# Patient Record
Sex: Male | Born: 1950
Health system: Southern US, Community
[De-identification: ages and names within clinical notes are randomized; demographics above are authoritative.]

## PROBLEM LIST (undated history)

## (undated) DIAGNOSIS — T7840XA Allergy, unspecified, initial encounter: Secondary | ICD-10-CM

## (undated) DIAGNOSIS — M419 Scoliosis, unspecified: Secondary | ICD-10-CM

## (undated) DIAGNOSIS — N529 Male erectile dysfunction, unspecified: Secondary | ICD-10-CM

## (undated) DIAGNOSIS — M1990 Unspecified osteoarthritis, unspecified site: Secondary | ICD-10-CM

## (undated) DIAGNOSIS — G8929 Other chronic pain: Secondary | ICD-10-CM

## (undated) DIAGNOSIS — E119 Type 2 diabetes mellitus without complications: Secondary | ICD-10-CM

## (undated) DIAGNOSIS — E669 Obesity, unspecified: Secondary | ICD-10-CM

## (undated) DIAGNOSIS — M549 Dorsalgia, unspecified: Secondary | ICD-10-CM

## (undated) DIAGNOSIS — E876 Hypokalemia: Secondary | ICD-10-CM

## (undated) DIAGNOSIS — I10 Essential (primary) hypertension: Secondary | ICD-10-CM

## (undated) DIAGNOSIS — K264 Chronic or unspecified duodenal ulcer with hemorrhage: Secondary | ICD-10-CM

## (undated) DIAGNOSIS — K222 Esophageal obstruction: Secondary | ICD-10-CM

## (undated) DIAGNOSIS — N390 Urinary tract infection, site not specified: Secondary | ICD-10-CM

## (undated) DIAGNOSIS — I509 Heart failure, unspecified: Secondary | ICD-10-CM

## (undated) DIAGNOSIS — R7302 Impaired glucose tolerance (oral): Secondary | ICD-10-CM

## (undated) DIAGNOSIS — J189 Pneumonia, unspecified organism: Secondary | ICD-10-CM

## (undated) DIAGNOSIS — Z22322 Carrier or suspected carrier of Methicillin resistant Staphylococcus aureus: Secondary | ICD-10-CM

## (undated) DIAGNOSIS — M48061 Spinal stenosis, lumbar region without neurogenic claudication: Secondary | ICD-10-CM

## (undated) DIAGNOSIS — K449 Diaphragmatic hernia without obstruction or gangrene: Secondary | ICD-10-CM

## (undated) HISTORY — DX: Impaired glucose tolerance (oral): R73.02

## (undated) HISTORY — DX: Type 2 diabetes mellitus without complications: E11.9

## (undated) HISTORY — DX: Male erectile dysfunction, unspecified: N52.9

## (undated) HISTORY — PX: NASAL POLYP SURGERY: SHX186

## (undated) HISTORY — PX: LUMBAR LAMINECTOMY: SHX95

## (undated) HISTORY — DX: Spinal stenosis, lumbar region without neurogenic claudication: M48.061

## (undated) HISTORY — DX: Pneumonia, unspecified organism: J18.9

## (undated) HISTORY — DX: Obesity, unspecified: E66.9

## (undated) HISTORY — DX: Allergy, unspecified, initial encounter: T78.40XA

## (undated) HISTORY — PX: TONSILLECTOMY: SUR1361

## (undated) HISTORY — DX: Scoliosis, unspecified: M41.9

## (undated) HISTORY — DX: Unspecified osteoarthritis, unspecified site: M19.90

---

## 1999-06-10 ENCOUNTER — Encounter: Payer: Self-pay | Admitting: Emergency Medicine

## 1999-06-10 ENCOUNTER — Emergency Department (HOSPITAL_COMMUNITY): Admission: EM | Admit: 1999-06-10 | Discharge: 1999-06-10 | Payer: Self-pay | Admitting: Emergency Medicine

## 1999-06-21 ENCOUNTER — Ambulatory Visit (HOSPITAL_COMMUNITY): Admission: RE | Admit: 1999-06-21 | Discharge: 1999-06-21 | Payer: Self-pay | Admitting: *Deleted

## 1999-06-21 ENCOUNTER — Encounter: Payer: Self-pay | Admitting: *Deleted

## 2006-09-09 ENCOUNTER — Ambulatory Visit: Payer: Self-pay | Admitting: Family Medicine

## 2006-09-09 LAB — CONVERTED CEMR LAB
ALT: 45 units/L — ABNORMAL HIGH (ref 0–40)
AST: 42 units/L — ABNORMAL HIGH (ref 0–37)
Albumin: 4 g/dL (ref 3.5–5.2)
Alkaline Phosphatase: 75 units/L (ref 39–117)
BUN: 11 mg/dL (ref 6–23)
Basophils Absolute: 0 10*3/uL (ref 0.0–0.1)
Basophils Relative: 0.3 % (ref 0.0–1.0)
CO2: 30 meq/L (ref 19–32)
Calcium: 9.5 mg/dL (ref 8.4–10.5)
Chloride: 108 meq/L (ref 96–112)
Chol/HDL Ratio, serum: 3.6
Cholesterol: 169 mg/dL (ref 0–200)
Creatinine, Ser: 1.1 mg/dL (ref 0.4–1.5)
Eosinophil percent: 5.2 % — ABNORMAL HIGH (ref 0.0–5.0)
GFR calc non Af Amer: 74 mL/min
Glomerular Filtration Rate, Af Am: 90 mL/min/{1.73_m2}
Glucose, Bld: 100 mg/dL — ABNORMAL HIGH (ref 70–99)
HCT: 44.4 % (ref 39.0–52.0)
HDL: 47.3 mg/dL (ref >39.0)
Hemoglobin: 14.7 g/dL (ref 13.0–17.0)
Hgb A1c MFr Bld: 6.3 % — ABNORMAL HIGH (ref 4.6–6.0)
LDL Cholesterol: 109 mg/dL — ABNORMAL HIGH (ref 0–99)
Lymphocytes Relative: 33.2 % (ref 12.0–46.0)
MCHC: 33.1 g/dL (ref 30.0–36.0)
MCV: 95.3 fL (ref 78.0–100.0)
Monocytes Absolute: 0.8 10*3/uL — ABNORMAL HIGH (ref 0.2–0.7)
Monocytes Relative: 15.2 % — ABNORMAL HIGH (ref 3.0–11.0)
Neutro Abs: 2.4 10*3/uL (ref 1.4–7.7)
Neutrophils Relative %: 46.1 % (ref 43.0–77.0)
PSA: 0.64 ng/mL (ref 0.10–4.00)
Platelets: 328 10*3/uL (ref 150–400)
Potassium: 4.8 meq/L (ref 3.5–5.1)
RBC: 4.66 M/uL (ref 4.22–5.81)
RDW: 13 % (ref 11.5–14.6)
Sodium: 142 meq/L (ref 135–145)
TSH: 1.03 microintl units/mL (ref 0.35–5.50)
Total Bilirubin: 0.5 mg/dL (ref 0.3–1.2)
Total Protein: 7.5 g/dL (ref 6.0–8.3)
Triglyceride fasting, serum: 66 mg/dL (ref 0–149)
VLDL: 13 mg/dL (ref 0–40)
WBC: 5.3 10*3/uL (ref 4.5–10.5)

## 2006-09-17 ENCOUNTER — Ambulatory Visit: Payer: Self-pay | Admitting: Family Medicine

## 2006-09-24 ENCOUNTER — Ambulatory Visit: Payer: Self-pay | Admitting: Family Medicine

## 2006-10-08 HISTORY — PX: COLONOSCOPY: SHX174

## 2006-10-10 ENCOUNTER — Ambulatory Visit: Payer: Self-pay | Admitting: Gastroenterology

## 2006-10-22 ENCOUNTER — Encounter (INDEPENDENT_AMBULATORY_CARE_PROVIDER_SITE_OTHER): Payer: Self-pay | Admitting: *Deleted

## 2006-10-22 ENCOUNTER — Ambulatory Visit: Payer: Self-pay | Admitting: Gastroenterology

## 2006-11-26 ENCOUNTER — Ambulatory Visit: Payer: Self-pay | Admitting: Family Medicine

## 2006-11-26 LAB — CONVERTED CEMR LAB
Cholesterol: 198 mg/dL (ref 0–200)
Creatinine,U: 144.8 mg/dL
Glucose, Bld: 114 mg/dL — ABNORMAL HIGH (ref 70–99)
HDL: 42.9 mg/dL (ref >39.0)
Hgb A1c MFr Bld: 6.6 % — ABNORMAL HIGH (ref 4.6–6.0)
LDL Cholesterol: 131 mg/dL — ABNORMAL HIGH (ref 0–99)
Microalb Creat Ratio: 11 mg/g (ref 0.0–30.0)
Microalb, Ur: 1.6 mg/dL (ref 0.0–1.9)
Total CHOL/HDL Ratio: 4.6
Triglycerides: 120 mg/dL (ref 0–149)
VLDL: 24 mg/dL (ref 0–40)

## 2007-10-31 ENCOUNTER — Ambulatory Visit: Payer: Self-pay | Admitting: Family Medicine

## 2007-10-31 LAB — CONVERTED CEMR LAB
ALT: 22 units/L (ref 0–53)
AST: 23 units/L (ref 0–37)
Albumin: 3.8 g/dL (ref 3.5–5.2)
Alkaline Phosphatase: 61 units/L (ref 39–117)
BUN: 12 mg/dL (ref 6–23)
Basophils Absolute: 0 10*3/uL (ref 0.0–0.1)
Basophils Relative: 0.5 % (ref 0.0–1.0)
Bilirubin Urine: NEGATIVE
Bilirubin, Direct: 0.1 mg/dL (ref 0.0–0.3)
Blood in Urine, dipstick: NEGATIVE
CO2: 31 meq/L (ref 19–32)
Calcium: 9.9 mg/dL (ref 8.4–10.5)
Chloride: 104 meq/L (ref 96–112)
Cholesterol: 181 mg/dL (ref 0–200)
Creatinine, Ser: 1.1 mg/dL (ref 0.4–1.5)
Eosinophils Absolute: 0.2 10*3/uL (ref 0.0–0.6)
Eosinophils Relative: 5 % (ref 0.0–5.0)
GFR calc Af Amer: 89 mL/min
GFR calc non Af Amer: 74 mL/min
Glucose, Bld: 121 mg/dL — ABNORMAL HIGH (ref 70–99)
Glucose, Urine, Semiquant: NEGATIVE
HCT: 37.7 % — ABNORMAL LOW (ref 39.0–52.0)
HDL: 34.8 mg/dL — ABNORMAL LOW (ref >39.0)
Hemoglobin: 13.3 g/dL (ref 13.0–17.0)
Ketones, urine, test strip: NEGATIVE
LDL Cholesterol: 128 mg/dL — ABNORMAL HIGH (ref 0–99)
Lymphocytes Relative: 37 % (ref 12.0–46.0)
MCHC: 35.3 g/dL (ref 30.0–36.0)
MCV: 92.5 fL (ref 78.0–100.0)
Monocytes Absolute: 0.7 10*3/uL (ref 0.2–0.7)
Monocytes Relative: 13.8 % — ABNORMAL HIGH (ref 3.0–11.0)
Neutro Abs: 2.1 10*3/uL (ref 1.4–7.7)
Neutrophils Relative %: 43.7 % (ref 43.0–77.0)
Nitrite: NEGATIVE
PSA: 0.54 ng/mL (ref 0.10–4.00)
Platelets: 270 10*3/uL (ref 150–400)
Potassium: 4.1 meq/L (ref 3.5–5.1)
Protein, U semiquant: NEGATIVE
RBC: 4.07 M/uL — ABNORMAL LOW (ref 4.22–5.81)
RDW: 12.4 % (ref 11.5–14.6)
Sodium: 141 meq/L (ref 135–145)
Specific Gravity, Urine: 1.025
TSH: 1.41 microintl units/mL (ref 0.35–5.50)
Total Bilirubin: 0.5 mg/dL (ref 0.3–1.2)
Total CHOL/HDL Ratio: 5.2
Total Protein: 7.2 g/dL (ref 6.0–8.3)
Triglycerides: 92 mg/dL (ref 0–149)
Urobilinogen, UA: 0.2
VLDL: 18 mg/dL (ref 0–40)
WBC Urine, dipstick: NEGATIVE
WBC: 4.8 10*3/uL (ref 4.5–10.5)
pH: 7

## 2007-11-07 ENCOUNTER — Ambulatory Visit: Payer: Self-pay | Admitting: Family Medicine

## 2007-11-07 DIAGNOSIS — E6609 Other obesity due to excess calories: Secondary | ICD-10-CM | POA: Insufficient documentation

## 2007-11-07 DIAGNOSIS — F528 Other sexual dysfunction not due to a substance or known physiological condition: Secondary | ICD-10-CM | POA: Insufficient documentation

## 2007-11-07 DIAGNOSIS — R739 Hyperglycemia, unspecified: Secondary | ICD-10-CM | POA: Insufficient documentation

## 2007-11-07 DIAGNOSIS — I10 Essential (primary) hypertension: Secondary | ICD-10-CM | POA: Insufficient documentation

## 2007-11-07 DIAGNOSIS — E669 Obesity, unspecified: Secondary | ICD-10-CM

## 2008-02-02 ENCOUNTER — Ambulatory Visit: Payer: Self-pay | Admitting: Family Medicine

## 2008-02-02 LAB — CONVERTED CEMR LAB: Hgb A1c MFr Bld: 6.4 % — ABNORMAL HIGH (ref 4.6–6.0)

## 2008-02-09 ENCOUNTER — Ambulatory Visit: Payer: Self-pay | Admitting: Family Medicine

## 2008-02-09 DIAGNOSIS — M25559 Pain in unspecified hip: Secondary | ICD-10-CM | POA: Insufficient documentation

## 2008-10-29 ENCOUNTER — Ambulatory Visit: Payer: Self-pay | Admitting: Family Medicine

## 2008-10-29 LAB — CONVERTED CEMR LAB
ALT: 25 units/L (ref 0–53)
AST: 30 units/L (ref 0–37)
Albumin: 4 g/dL (ref 3.5–5.2)
Alkaline Phosphatase: 60 units/L (ref 39–117)
BUN: 14 mg/dL (ref 6–23)
Basophils Absolute: 0 10*3/uL (ref 0.0–0.1)
Basophils Relative: 0.5 % (ref 0.0–3.0)
Bilirubin Urine: NEGATIVE
Bilirubin, Direct: 0.1 mg/dL (ref 0.0–0.3)
Blood in Urine, dipstick: NEGATIVE
CO2: 28 meq/L (ref 19–32)
Calcium: 9.6 mg/dL (ref 8.4–10.5)
Chloride: 105 meq/L (ref 96–112)
Cholesterol: 168 mg/dL (ref 0–200)
Creatinine, Ser: 1 mg/dL (ref 0.4–1.5)
Eosinophils Absolute: 0.2 10*3/uL (ref 0.0–0.7)
Eosinophils Relative: 3.1 % (ref 0.0–5.0)
GFR calc Af Amer: 99 mL/min
GFR calc non Af Amer: 82 mL/min
Glucose, Bld: 109 mg/dL — ABNORMAL HIGH (ref 70–99)
Glucose, Urine, Semiquant: NEGATIVE
HCT: 38.7 % — ABNORMAL LOW (ref 39.0–52.0)
HDL: 34.7 mg/dL — ABNORMAL LOW (ref >39.0)
Hemoglobin: 13 g/dL (ref 13.0–17.0)
Hgb A1c MFr Bld: 6.5 % — ABNORMAL HIGH (ref 4.6–6.0)
Ketones, urine, test strip: NEGATIVE
LDL Cholesterol: 119 mg/dL — ABNORMAL HIGH (ref 0–99)
Lymphocytes Relative: 26.8 % (ref 12.0–46.0)
MCHC: 33.6 g/dL (ref 30.0–36.0)
MCV: 96.3 fL (ref 78.0–100.0)
Monocytes Absolute: 0.6 10*3/uL (ref 0.1–1.0)
Monocytes Relative: 11.2 % (ref 3.0–12.0)
Neutro Abs: 2.9 10*3/uL (ref 1.4–7.7)
Neutrophils Relative %: 58.4 % (ref 43.0–77.0)
Nitrite: NEGATIVE
PSA: 0.51 ng/mL (ref 0.10–4.00)
Platelets: 289 10*3/uL (ref 150–400)
Potassium: 3.4 meq/L — ABNORMAL LOW (ref 3.5–5.1)
Protein, U semiquant: NEGATIVE
RBC: 4.02 M/uL — ABNORMAL LOW (ref 4.22–5.81)
RDW: 13.1 % (ref 11.5–14.6)
Sodium: 140 meq/L (ref 135–145)
Specific Gravity, Urine: 1.02
TSH: 1.04 microintl units/mL (ref 0.35–5.50)
Total Bilirubin: 0.7 mg/dL (ref 0.3–1.2)
Total CHOL/HDL Ratio: 4.8
Total Protein: 7.5 g/dL (ref 6.0–8.3)
Triglycerides: 72 mg/dL (ref 0–149)
Urobilinogen, UA: 0.2
VLDL: 14 mg/dL (ref 0–40)
WBC Urine, dipstick: NEGATIVE
WBC: 5.1 10*3/uL (ref 4.5–10.5)
pH: 6.5

## 2008-11-02 ENCOUNTER — Ambulatory Visit: Payer: Self-pay | Admitting: Family Medicine

## 2008-11-02 DIAGNOSIS — M1990 Unspecified osteoarthritis, unspecified site: Secondary | ICD-10-CM | POA: Insufficient documentation

## 2009-01-25 ENCOUNTER — Ambulatory Visit: Payer: Self-pay | Admitting: Family Medicine

## 2009-01-25 ENCOUNTER — Telehealth: Payer: Self-pay | Admitting: Family Medicine

## 2009-01-25 LAB — CONVERTED CEMR LAB
BUN: 10 mg/dL (ref 6–23)
CO2: 26 meq/L (ref 19–32)
Calcium: 9.4 mg/dL (ref 8.4–10.5)
Chloride: 108 meq/L (ref 96–112)
Creatinine, Ser: 0.9 mg/dL (ref 0.4–1.5)
GFR calc non Af Amer: 112.05 mL/min (ref >60)
Glucose, Bld: 118 mg/dL — ABNORMAL HIGH (ref 70–99)
Hgb A1c MFr Bld: 6.5 % (ref 4.6–6.5)
Potassium: 3.5 meq/L (ref 3.5–5.1)
Sodium: 140 meq/L (ref 135–145)

## 2009-02-01 ENCOUNTER — Ambulatory Visit: Payer: Self-pay | Admitting: Family Medicine

## 2009-06-23 ENCOUNTER — Telehealth: Payer: Self-pay | Admitting: Internal Medicine

## 2009-09-21 ENCOUNTER — Encounter (INDEPENDENT_AMBULATORY_CARE_PROVIDER_SITE_OTHER): Payer: Self-pay | Admitting: *Deleted

## 2009-10-24 ENCOUNTER — Telehealth: Payer: Self-pay | Admitting: Family Medicine

## 2009-10-24 ENCOUNTER — Ambulatory Visit: Payer: Self-pay | Admitting: Family Medicine

## 2009-10-24 LAB — CONVERTED CEMR LAB
ALT: 26 units/L (ref 0–53)
AST: 27 units/L (ref 0–37)
Albumin: 4 g/dL (ref 3.5–5.2)
Alkaline Phosphatase: 62 units/L (ref 39–117)
BUN: 8 mg/dL (ref 6–23)
Basophils Absolute: 0 10*3/uL (ref 0.0–0.1)
Basophils Relative: 0.6 % (ref 0.0–3.0)
Bilirubin Urine: NEGATIVE
Bilirubin, Direct: 0.1 mg/dL (ref 0.0–0.3)
Blood in Urine, dipstick: NEGATIVE
CO2: 27 meq/L (ref 19–32)
Calcium: 9.6 mg/dL (ref 8.4–10.5)
Chloride: 106 meq/L (ref 96–112)
Cholesterol: 175 mg/dL (ref 0–200)
Creatinine, Ser: 0.9 mg/dL (ref 0.4–1.5)
Eosinophils Absolute: 0.1 10*3/uL (ref 0.0–0.7)
Eosinophils Relative: 3 % (ref 0.0–5.0)
GFR calc non Af Amer: 111.75 mL/min (ref >60)
Glucose, Bld: 123 mg/dL — ABNORMAL HIGH (ref 70–99)
Glucose, Urine, Semiquant: NEGATIVE
HCT: 39.7 % (ref 39.0–52.0)
HDL: 39.3 mg/dL (ref >39.00)
Hemoglobin: 12.9 g/dL — ABNORMAL LOW (ref 13.0–17.0)
Ketones, urine, test strip: NEGATIVE
LDL Cholesterol: 122 mg/dL — ABNORMAL HIGH (ref 0–99)
Lymphocytes Relative: 24.3 % (ref 12.0–46.0)
Lymphs Abs: 1.2 10*3/uL (ref 0.7–4.0)
MCHC: 32.5 g/dL (ref 30.0–36.0)
MCV: 101.7 fL — ABNORMAL HIGH (ref 78.0–100.0)
Monocytes Absolute: 0.5 10*3/uL (ref 0.1–1.0)
Monocytes Relative: 9.3 % (ref 3.0–12.0)
Neutro Abs: 3.2 10*3/uL (ref 1.4–7.7)
Neutrophils Relative %: 62.8 % (ref 43.0–77.0)
Nitrite: NEGATIVE
PSA: 0.53 ng/mL (ref 0.10–4.00)
Platelets: 282 10*3/uL (ref 150.0–400.0)
Potassium: 3.9 meq/L (ref 3.5–5.1)
Protein, U semiquant: NEGATIVE
RBC: 3.9 M/uL — ABNORMAL LOW (ref 4.22–5.81)
RDW: 13.6 % (ref 11.5–14.6)
Sodium: 142 meq/L (ref 135–145)
Specific Gravity, Urine: 1.02
TSH: 0.45 microintl units/mL (ref 0.35–5.50)
Total Bilirubin: 0.7 mg/dL (ref 0.3–1.2)
Total CHOL/HDL Ratio: 4
Total Protein: 7.4 g/dL (ref 6.0–8.3)
Triglycerides: 68 mg/dL (ref 0.0–149.0)
Urobilinogen, UA: 0.2
VLDL: 13.6 mg/dL (ref 0.0–40.0)
WBC Urine, dipstick: NEGATIVE
WBC: 5 10*3/uL (ref 4.5–10.5)
pH: 6.5

## 2009-10-31 ENCOUNTER — Ambulatory Visit: Payer: Self-pay | Admitting: Family Medicine

## 2010-01-02 ENCOUNTER — Encounter: Payer: Self-pay | Admitting: Internal Medicine

## 2010-01-02 ENCOUNTER — Ambulatory Visit: Payer: Self-pay | Admitting: Family Medicine

## 2010-01-03 LAB — CONVERTED CEMR LAB
BUN: 7 mg/dL (ref 6–23)
CO2: 28 meq/L (ref 19–32)
Calcium: 9.3 mg/dL (ref 8.4–10.5)
Chloride: 104 meq/L (ref 96–112)
Creatinine, Ser: 0.8 mg/dL (ref 0.4–1.5)
GFR calc non Af Amer: 127.93 mL/min (ref >60)
Glucose, Bld: 122 mg/dL — ABNORMAL HIGH (ref 70–99)
Hgb A1c MFr Bld: 6.3 % (ref 4.6–6.5)
Potassium: 3.3 meq/L — ABNORMAL LOW (ref 3.5–5.1)
Sodium: 140 meq/L (ref 135–145)

## 2010-03-07 ENCOUNTER — Telehealth: Payer: Self-pay | Admitting: Family Medicine

## 2010-03-08 ENCOUNTER — Telehealth: Payer: Self-pay | Admitting: Family Medicine

## 2010-03-09 ENCOUNTER — Telehealth: Payer: Self-pay | Admitting: Family Medicine

## 2010-05-24 ENCOUNTER — Telehealth: Payer: Self-pay | Admitting: Gastroenterology

## 2010-07-27 ENCOUNTER — Telehealth: Payer: Self-pay | Admitting: Family Medicine

## 2010-07-28 ENCOUNTER — Telehealth: Payer: Self-pay | Admitting: Family Medicine

## 2010-10-26 ENCOUNTER — Ambulatory Visit
Admission: RE | Admit: 2010-10-26 | Discharge: 2010-10-26 | Payer: Self-pay | Source: Home / Self Care | Attending: Family Medicine | Admitting: Family Medicine

## 2010-10-26 ENCOUNTER — Other Ambulatory Visit: Payer: Self-pay | Admitting: Family Medicine

## 2010-10-26 LAB — CBC WITH DIFFERENTIAL/PLATELET
Basophils Absolute: 0 10*3/uL (ref 0.0–0.1)
Basophils Relative: 0.3 % (ref 0.0–3.0)
Eosinophils Absolute: 0.2 10*3/uL (ref 0.0–0.7)
Eosinophils Relative: 4.3 % (ref 0.0–5.0)
HCT: 41 % (ref 39.0–52.0)
Hemoglobin: 13.5 g/dL (ref 13.0–17.0)
Lymphocytes Relative: 29.3 % (ref 12.0–46.0)
Lymphs Abs: 1.4 10*3/uL (ref 0.7–4.0)
MCHC: 33 g/dL (ref 30.0–36.0)
MCV: 97.9 fl (ref 78.0–100.0)
Monocytes Absolute: 0.5 10*3/uL (ref 0.1–1.0)
Monocytes Relative: 10.1 % (ref 3.0–12.0)
Neutro Abs: 2.7 10*3/uL (ref 1.4–7.7)
Neutrophils Relative %: 56 % (ref 43.0–77.0)
Platelets: 313 10*3/uL (ref 150.0–400.0)
RBC: 4.18 Mil/uL — ABNORMAL LOW (ref 4.22–5.81)
RDW: 13.5 % (ref 11.5–14.6)
WBC: 4.9 10*3/uL (ref 4.5–10.5)

## 2010-10-26 LAB — BASIC METABOLIC PANEL
BUN: 14 mg/dL (ref 6–23)
CO2: 27 mEq/L (ref 19–32)
Calcium: 9.3 mg/dL (ref 8.4–10.5)
Chloride: 102 mEq/L (ref 96–112)
Creatinine, Ser: 1.1 mg/dL (ref 0.4–1.5)
GFR: 89.27 mL/min (ref >60.00)
Glucose, Bld: 111 mg/dL — ABNORMAL HIGH (ref 70–99)
Potassium: 3.6 mEq/L (ref 3.5–5.1)
Sodium: 137 mEq/L (ref 135–145)

## 2010-10-26 LAB — CONVERTED CEMR LAB
Bilirubin Urine: NEGATIVE
Blood in Urine, dipstick: NEGATIVE
Glucose, Urine, Semiquant: NEGATIVE
Ketones, urine, test strip: NEGATIVE
Nitrite: NEGATIVE
Protein, U semiquant: NEGATIVE
Specific Gravity, Urine: 1.02
Urobilinogen, UA: 0.2
WBC Urine, dipstick: NEGATIVE
pH: 7

## 2010-10-26 LAB — HEPATIC FUNCTION PANEL
ALT: 28 U/L (ref 0–53)
AST: 31 U/L (ref 0–37)
Albumin: 3.8 g/dL (ref 3.5–5.2)
Alkaline Phosphatase: 72 U/L (ref 39–117)
Bilirubin, Direct: 0 mg/dL (ref 0.0–0.3)
Total Bilirubin: 0.4 mg/dL (ref 0.3–1.2)
Total Protein: 7.5 g/dL (ref 6.0–8.3)

## 2010-10-26 LAB — LIPID PANEL
Cholesterol: 160 mg/dL (ref 0–200)
HDL: 37.5 mg/dL — ABNORMAL LOW (ref >39.00)
LDL Cholesterol: 109 mg/dL — ABNORMAL HIGH (ref 0–99)
Total CHOL/HDL Ratio: 4
Triglycerides: 66 mg/dL (ref 0.0–149.0)
VLDL: 13.2 mg/dL (ref 0.0–40.0)

## 2010-10-26 LAB — PSA: PSA: 0.56 ng/mL (ref 0.10–4.00)

## 2010-10-26 LAB — TSH: TSH: 1.2 u[IU]/mL (ref 0.35–5.50)

## 2010-11-02 ENCOUNTER — Encounter: Payer: Self-pay | Admitting: Family Medicine

## 2010-11-02 ENCOUNTER — Ambulatory Visit
Admission: RE | Admit: 2010-11-02 | Discharge: 2010-11-02 | Payer: Self-pay | Source: Home / Self Care | Attending: Family Medicine | Admitting: Family Medicine

## 2010-11-07 NOTE — Progress Notes (Signed)
Summary: Schedule Colonoscopy.  Phone Note Outgoing Call Call back at [facility] Phone [phone]   Call placed by: Harlow Mares CMA Duncan Dull),  May 24, 2010 9:23 AM Call placed to: Patient Summary of Call: patient will cal back to schedule his colonoscopy he is in the process of moving. Initial call taken by: Harlow Mares CMA Duncan Dull),  May 24, 2010 9:32 AM

## 2010-11-07 NOTE — Assessment & Plan Note (Signed)
Summary: 3 MONTH ROV/NJR   Vital Signs:  Patient profile:   60 year old male Height:      71.25 inches Weight:      278 pounds BMI:     38.64 BP sitting:   124 / 78  (left arm) Cuff size:   large  Vitals Entered By: Kern Reap CMA (February 01, 2009 9:04 AM)  History of Present Illness: Keith [name] is a 60 year old male, who comes in today for reevaluation of 3 problems.  He has underlying hypertension, for which he takes Tenoretic 50 to 25 q.a.m., clonidine, .1 q.a.m..  BP now is down to 124/78.  No side effects from medication.  He did have an elevated blood sugar when we did as a physical exam in January.  He's been walking now and changed his diet.  Blood sugars dropped back to 118 with a hemoglobin A1c of 6.5.  His underlying osteoarthritis, and we tried Motrin, 600 mg 3 times a day, however, that did not help.  We switch to Darvocet-N 100 t.i.d., p.r.n., that's helped tremendously.  He is able to walk.  No side effects  Allergies (verified): No Known Drug Allergies  Past History:  Past medical, surgical, family and social histories (including risk factors) reviewed, and no changes noted (except as noted below).  Past Medical History:    Reviewed history from 11/02/2008 and no changes required:    Hypertension    obesity    erectile dysfunction    glucose intolerance    Osteoarthritis  Family History:    Reviewed history from 10/29/2008 and no changes required:       father is 15, had a CVA when he was 50 from hypertension and smoking       mother 54, diabetes, and asthma       one brother in good health              mother is diabetic, had a leg amputation.  He had to put her in a nursing home.  This year.  Father died suddenly at age 60 at home unknown cause.  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 Take one tab three times a day  Problem # 2:  OTHER ABNORMAL GLUCOSE (ICD-790.29) Assessment: Improved  Problem # 3:  HYPERTENSION (ICD-401.9) Assessment: Improved  His updated medication list for this problem includes:    Clonidine Hcl 0.1 Mg Tabs (Clonidine hcl) .Marland Kitchen... Take 1 tablet by mouth every morning ;cpx when due    Tenoretic 50 50-25 Mg Tabs (Atenolol-chlorthalidone) .Marland Kitchen... Take 1 tablet by mouth once in the am;cpx when due  Complete Medication List: 1)  Clonidine Hcl 0.1 Mg Tabs (Clonidine hcl) .... Take 1 tablet by mouth every morning ;cpx  when due 2)  Tenoretic 50 50-25 Mg Tabs (Atenolol-chlorthalidone) .... Take 1 tablet by mouth once in the am;cpx when due 3)  Adprin B 325 Mg Tabs (Aspirin buf(cacarb-mgcarb-mgo)) 4)  Viagra 50 Mg Tabs (Sildenafil citrate) .... Uad 5)  Darvocet-n 100 100-650 Mg Tabs (Propoxyphene n-apap) .... Take one tab three times a day  Patient Instructions: 1)  continued the current treatment program.  Return in January 2011 for your annual physical exam. Prescriptions: DARVOCET-N 100 100-650 MG TABS (PROPOXYPHENE N-APAP) take one tab three times a day  #100 x 3   Entered and Authorized by:   Roderick Pee MD   Signed by:   Roderick Pee MD on 02/01/2009   Method used:   Print then Give to Patient   RxID:   [id number]

## 2010-11-07 NOTE — Progress Notes (Signed)
Summary: [facility] hasnt been called in  Phone Note Call from Patient Call back at [phone] cell   Caller: Patient Summary of Call: [facility] [address] and was told that pharmacy has not rcvd refill for the Vicodin yet. Pls call in asap today.  Initial call taken by: Lucy Antigua,  July 28, 2010 11:08 AM  Follow-up for Phone Call        Phone Call Completed Follow-up by: Kern Reap CMA Duncan Dull),  July 28, 2010 12:13 PM

## 2010-11-07 NOTE — Progress Notes (Signed)
Summary: Pt called to check on status of refill for Vicodin  Phone Note Call from Patient Call back at Home Phone [phone]   Caller: Patient Summary of Call: Pt called to check on status of [facility]ill req [facility] [address] Pt called pharmacy and was told nothing has been called in yet.  Initial call taken by: Lucy Antigua,  March 09, 2010 11:19 AM  Follow-up for Phone Call        spoke with pharmacy and patient  Follow-up by: Kern Reap CMA Duncan Dull),  March 09, 2010 11:49 AM

## 2010-11-07 NOTE — Assessment & Plan Note (Signed)
Summary: cpx/ patient will come fasting/mhf   Vital Signs:  Patient Profile:   60 Years Old Male Height:     71.25 inches (182.25 cm) Weight:      284 pounds Temp:     98.2 degrees F oral BP sitting:   148 / 98  (left arm) Cuff size:   large  Vitals Entered By: Kern Reap CMA (October 29, 2008 9:40 AM)                 Chief Complaint:  cpx.  History of Present Illness: Keith [name]  is a 60 year old male nonsmoker who comes in today for physical exam for evaluation of underlying hypertension erectile dysfunction and a new problem pain in the left side of his back.  His hypertension is treated with clonidine, .1, daily, and Tenoretic 50 -- 25 daily.  BP at home 130/85.  Erectile dysfunction.  She was graft a half a tablet p.r.n.  Doing well.  No problems.  A new problem is back pain.  He, states it's been there for a couple months.  If the left lumbar area.  It's worse when he sits for long periods of time.  The discomfort tends to radiate into his leg and is experiencing occasional numbness in his toes.  He is never any trauma to the spine.  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  Last tetanus 2005.  He declines both flu shots    Prior Medication List:  CLONIDINE HCL 0.1 MG  TABS (CLONIDINE HCL) Take 1 tablet by mouth every morning ;cpx when due TENORETIC 50 50-25 MG  TABS (ATENOLOL-CHLORTHALIDONE) Take 1 tablet by mouth once in the am;cpx when due ADPRIN B 325 MG  TABS (ASPIRIN BUF(CACARB-MGCARB-MGO))  VIAGRA 50 MG  TABS (SILDENAFIL CITRATE) UAD   Updated Prior Medication List: CLONIDINE HCL 0.1 MG  TABS (CLONIDINE HCL) Take 1 tablet by mouth every morning ;cpx when due TENORETIC 50 50-25 MG  TABS (ATENOLOL-CHLORTHALIDONE) Take 1 tablet by mouth once in the am;cpx when due ADPRIN B 325 MG  TABS (ASPIRIN BUF(CACARB-MGCARB-MGO))  VIAGRA 50 MG  TABS (SILDENAFIL CITRATE) UAD  Current Allergies: No known allergies   Past Medical  History:    Reviewed history from 02/09/2008 and no changes required:       Hypertension       obesity       erectile dysfunction       glucose intolerance   Family History:    Reviewed history from 11/07/2007 and no changes required:       father is 35, had a CVA when he was 51 from hypertension and smoking       mother 9, diabetes, and asthma       one brother in good health              mother is diabetic, had a leg amputation.  He had to put her in a nursing home.  This year.  Father died suddenly at age 68 at home unknown cause.  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 No apparent alopecia or balding. Eyes:     No corneal or conjunctival inflammation noted. EOMI. Perrla. Funduscopic exam benign, without hemorrhages, exudates or papilledema. Vision grossly normal. Ears:     External ear exam shows no significant lesions or deformities.  Otoscopic examination reveals clear canals, tympanic membranes are intact bilaterally without bulging, retraction, inflammation or discharge. Hearing is grossly normal bilaterally. Nose:     External nasal examination shows no deformity or inflammation. Nasal mucosa are pink and moist without lesions or exudates. Mouth:     Oral mucosa and oropharynx without lesions or exudates.  Teeth in good repair. Neck:     No deformities, masses, or tenderness noted. Chest Wall:     No deformities, masses, tenderness or gynecomastia noted. Breasts:     No masses or gynecomastia noted Lungs:     Normal  respiratory effort, chest expands symmetrically. Lungs are clear to auscultation, no crackles or wheezes. Heart:     Normal rate and regular rhythm. S1 and S2 normal without gallop, murmur, click, rub or other extra sounds. Abdomen:     Bowel sounds positive,abdomen soft and non-tender without masses, organomegaly or hernias noted. Rectal:     No external abnormalities noted. Normal sphincter tone. No rectal masses or tenderness. Genitalia:     Testes bilaterally descended without nodularity, tenderness or masses. No scrotal masses or lesions. No penis lesions or urethral discharge. Prostate:     Prostate gland firm and smooth, no enlargement, nodularity, tenderness, mass, asymmetry or induration. Msk:     No deformity or scoliosis noted of thoracic or lumbar spine.   Pulses:     R and L carotid,radial,femoral,dorsalis pedis and posterior tibial pulses are full and equal bilaterally Extremities:     No clubbing, cyanosis, edema, or deformity noted with normal full range of motion of all joints.   Neurologic:     No cranial nerve deficits noted. Station and gait are normal. Plantar reflexes are down-going bilaterally. DTRs are symmetrical throughout. Sensory, motor and coordinative functions appear intact. Skin:     Intact without suspicious lesions or rashes Cervical Nodes:     No lymphadenopathy noted Axillary Nodes:     No palpable lymphadenopathy Inguinal Nodes:     No significant adenopathy Psych:     Cognition and judgment appear intact. Alert and cooperative with normal attention span and concentration. No apparent delusions, illusions, hallucinations    Impression & Recommendations:  Problem # 1:  HIP PAIN, LEFT (ICD-719.45) Assessment: New  His updated medication list for this problem includes:    Adprin B 325 Mg Tabs (Aspirin buf(cacarb-mgcarb-mgo))  Orders: TLB-Lipid Panel (80061-LIPID) TLB-BMP (Basic Metabolic Panel-BMET) (80048-METABOL) TLB-CBC Platelet -  w/Differential (85025-CBCD) TLB-Hepatic/Liver Function Pnl (80076-HEPATIC) TLB-TSH (Thyroid Stimulating Hormone) (84443-TSH) TLB-A1C / Hgb A1C (Glycohemoglobin) (83036-A1C) TLB-PSA (Prostate Specific Antigen) (84153-PSA) T-Lumbar Spine Complete, 5 Views (71110TC) T-Pelvis 1or 2 views (72170TC)   Problem # 2:  OTHER ABNORMAL GLUCOSE (ICD-790.29) Assessment: Unchanged  Orders: TLB-Lipid Panel (80061-LIPID) TLB-BMP (Basic Metabolic Panel-BMET) (80048-METABOL) TLB-CBC Platelet - w/Differential (85025-CBCD) TLB-Hepatic/Liver Function Pnl (80076-HEPATIC) TLB-TSH (Thyroid Stimulating Hormone) (84443-TSH) TLB-A1C / Hgb A1C (Glycohemoglobin) (83036-A1C) TLB-PSA (Prostate Specific Antigen) (84153-PSA)   Problem # 3:  OBESITY (ICD-278.00) Assessment: Unchanged  Orders: TLB-Lipid Panel (80061-LIPID) TLB-BMP (Basic Metabolic Panel-BMET) (80048-METABOL) TLB-CBC Platelet - w/Differential (85025-CBCD) TLB-Hepatic/Liver  Function Pnl (80076-HEPATIC) TLB-TSH (Thyroid Stimulating Hormone) (84443-TSH) TLB-A1C / Hgb A1C (Glycohemoglobin) (83036-A1C) TLB-PSA (Prostate Specific Antigen) (84153-PSA)   Problem # 4:  HYPERTENSION (ICD-401.9) Assessment: Improved  His updated medication list for this problem includes:    Clonidine Hcl 0.1 Mg Tabs (Clonidine hcl) .Marland Kitchen... Take 1 tablet by mouth every morning ;cpx when due    Tenoretic 50 50-25 Mg Tabs (Atenolol-chlorthalidone) .Marland Kitchen... Take 1 tablet by mouth once in the am;cpx when due  Orders: TLB-Lipid Panel (80061-LIPID) TLB-BMP (Basic Metabolic Panel-BMET) (80048-METABOL) TLB-CBC Platelet - w/Differential (85025-CBCD) TLB-Hepatic/Liver Function Pnl (80076-HEPATIC) TLB-TSH (Thyroid Stimulating Hormone) (84443-TSH) TLB-A1C / Hgb A1C (Glycohemoglobin) (83036-A1C) TLB-PSA (Prostate Specific Antigen) (84153-PSA) UA Dipstick w/o Micro (automated)  (81003) EKG w/ Interpretation (93000)   Problem # 5:  ERECTILE DYSFUNCTION, MILD (ICD-302.72)  Assessment: Improved  His updated medication list for this problem includes:    Viagra 50 Mg Tabs (Sildenafil citrate) ..... Uad  Orders: TLB-Lipid Panel (80061-LIPID) TLB-BMP (Basic Metabolic Panel-BMET) (80048-METABOL) TLB-CBC Platelet - w/Differential (85025-CBCD) TLB-Hepatic/Liver Function Pnl (80076-HEPATIC) TLB-TSH (Thyroid Stimulating Hormone) (84443-TSH) TLB-A1C / Hgb A1C (Glycohemoglobin) (83036-A1C) TLB-PSA (Prostate Specific Antigen) (84153-PSA)   Complete Medication List: 1)  Clonidine Hcl 0.1 Mg Tabs (Clonidine hcl) .... Take 1 tablet by mouth every morning ;cpx when due 2)  Tenoretic 50 50-25 Mg Tabs (Atenolol-chlorthalidone) .... Take 1 tablet by mouth once in the am;cpx when due 3)  Adprin B 325 Mg Tabs (Aspirin buf(cacarb-mgcarb-mgo)) 4)  Viagra 50 Mg Tabs (Sildenafil citrate) .... Uad  Other Orders: Venipuncture (47829)   Patient Instructions: 1)  take Motrin, 600 mg twice a day with food 3.  Her low back pain.  Go to the main office get x-rays of the lumbar spine.  Return two to 3 days after x-rays to review the report. 2)  It is important that you exercise regularly at least 20 minutes 5 times a week. If you develop chest pain, have severe difficulty breathing, or feel very tired , stop exercising immediately and seek medical attention. 3)  You need to lose weight. Consider a lower calorie diet and regular exercise.  4)  Schedule a colonoscopy/sigmoidoscopy to help detect colon cancer. 5)  Take an Aspirin every day. 6)  Check your Blood Pressure regularly. If it is above: you should make an appointment.   Prescriptions: VIAGRA 50 MG  TABS (SILDENAFIL CITRATE) UAD  #6 x 11   Entered and Authorized by:   Roderick Pee MD   Signed by:   Roderick Pee MD on 10/29/2008   Method used:   [facility]  [address] [id number]       [address]       [place], [place]  [address]       Ph: [phone] or [phone]        Fax: [phone]   RxID:   [id number] TENORETIC 50 50-25 MG  TABS (ATENOLOL-CHLORTHALIDONE) Take 1 tablet by mouth once in the am;cpx when due  #100 x 4   Entered and Authorized by:   Roderick Pee MD   Signed by:   Roderick Pee MD on 10/29/2008   Method used:   [facility]        [facility]  [address] [id number]       [address]       [facility] [place], [place]  [address]       Ph: [phone] or [phone]       Fax: [phone]   RxID:   [id number] CLONIDINE HCL 0.1 MG  TABS (CLONIDINE HCL) Take 1 tablet by mouth every morning ;cpx when due  #100 x 4   Entered and Authorized by:   Roderick Pee MD   Signed by:   Roderick Pee MD on 10/29/2008   Method used:   [facility]        [facility]  [address] [id number]       [address]       [place], [place]  [address]       Ph: [phone] or [phone]       Fax: [phone]   RxID:   [id number]     Laboratory Results   Urine Tests   Date/Time Reported: October 29, 2008 12:50 PM   Routine Urinalysis   Color: yellow Appearance: Clear Glucose: negative   (Normal Range: Negative) Bilirubin: negative   (Normal Range: Negative) Ketone: negative   (Normal Range: Negative) Spec. Gravity: 1.020   (Normal Range: 1.003-1.035) Blood: negative   (Normal Range: Negative) pH: 6.5   (Normal Range: 5.0-8.0) Protein: negative   (Normal Range: Negative) Urobilinogen: 0.2   (Normal Range: 0-1) Nitrite: negative   (Normal Range: Negative) Leukocyte Esterace: negative   (Normal Range: Negative)    Comments: Wynona Canes, CMA  October 29, 2008 12:50 PM

## 2010-11-07 NOTE — Progress Notes (Signed)
Summary: REFILL REQUEST  Phone Note Refill Request Message from:  Fax from Pharmacy on March 08, 2010 2:26 PM  Refills Requested: Medication #1:  VICODIN ES 7.5-750 MG TABS take half tab two times a day as needed pain.   Notes: [facility] [address]  called in 03/07/10  Initial call taken by: Debbra Riding,  March 08, 2010 2:27 PM

## 2010-11-07 NOTE — Progress Notes (Signed)
Summary: vicodin refill  Phone Note From Pharmacy   [facility]  [address] [id number] Reason for Call: Needs renewal Summary of Call: patient would like a refill of vicodin 7.5/750.  last cpx 1/11 last refill 05/26/10. is this okay to fill? Initial call taken by: Kern Reap CMA Duncan Dull),  July 27, 2010 12:38 PM  Follow-up for Phone Call         dispense 30 tabletsVicodin ES........ directions one half tab b.i.d., p.r.n. severe pain, refills x 5 Follow-up by: Roderick Pee MD,  July 27, 2010 12:43 PM  Additional Follow-up for Phone Call Additional follow up Details #1::        Pharmacist called Additional Follow-up by: Kern Reap CMA Duncan Dull),  July 28, 2010 8:52 AM

## 2010-11-07 NOTE — Progress Notes (Signed)
Summary: propoxyphene refill  Phone Note Call from Patient   Summary of Call: patient rx for propoxyphene is no longer available.  would you like to change this to a new rx?  [facility] [address]. Initial call taken by: Kern Reap CMA Duncan Dull),  October 24, 2009 9:11 AM  Follow-up for Phone Call        Vicodin ES..... dispense 60 tablets directions one half tablet b.i.d. for pain, refills x 4 Follow-up by: Roderick Pee MD,  October 24, 2009 1:46 PM  Additional Follow-up for Phone Call Additional follow up Details #1::        Rx Called In left message on machine for patient  Additional Follow-up by: Kern Reap CMA Duncan Dull),  October 24, 2009 5:30 PM    New/Updated Medications: VICODIN ES 7.5-750 MG TABS (HYDROCODONE-ACETAMINOPHEN) take half tab two times a day as needed pain

## 2010-11-07 NOTE — Assessment & Plan Note (Signed)
Summary: 4 day rov/njr   Vital Signs:  Patient Profile:   60 Years Old Male Height:     71.25 inches (182.25 cm) Weight:      279 pounds Temp:     98.2 degrees F oral BP sitting:   124 / 78  (left arm) Cuff size:   large  Vitals Entered By: Kern Reap CMA (November 02, 2008 9:20 AM)                 Chief Complaint:  follow up scans and labs.  History of Present Illness: Keith [name] is a 60 year old male, who comes in today for evaluation of hypertension, low back pain, and glucose intolerance.  His hypertension is treated with clonidine, .1, daily, and Tenoretic 50 -- 25 daily.  BP 124/78.  No side effects from medication.  His back and hip pain had been present now for about a year.  He said one day well at work he twisted 8 or 9 months ago didn't think anything about it, but no symptoms comfort.  His left hip with some tingling in his left great toe.  Since that time.  The discomfort comes and goes.  Muscle strength is normal.  No history of trauma.  We did x-rays of his back, which showed some degenerative changes in his lumbar spine.  Hips are normal.  No evidence of tumor.  He also has glucose intolerance with a fasting blood sugar of 109 however, his hemoglobin A1c is 6.5.  We discussed diet, exercise and weight loss, and potential medication.  Patient would like to try diet and exercise first.    Prior Medication List:  CLONIDINE HCL 0.1 MG  TABS (CLONIDINE HCL) Take 1 tablet by mouth every morning ;cpx when due TENORETIC 50 50-25 MG  TABS (ATENOLOL-CHLORTHALIDONE) Take 1 tablet by mouth once in the am;cpx when due ADPRIN B 325 MG  TABS (ASPIRIN BUF(CACARB-MGCARB-MGO))  VIAGRA 50 MG  TABS (SILDENAFIL CITRATE) UAD   Current Allergies (reviewed today): No known allergies   Past Medical History:    Reviewed history from 02/09/2008 and no changes required:       Hypertension       obesity       erectile dysfunction       glucose intolerance       Osteoarthritis    Social History:    Reviewed history from 11/07/2007 and no changes required:       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[employer], [employer]       Married       Former Smoker 2005       Alcohol use-no       Drug use-no       Regular exercise-no    Review of Systems      See HPI   Physical Exam  General:     Well-developed,well-nourished,in no acute distress; alert,appropriate and cooperative throughout examination    Impression & Recommendations:  Problem # 1:  OSTEOARTHRITIS (ICD-715.90) Assessment: New  His updated medication list for this problem includes:    Adprin B 325 Mg Tabs (Aspirin buf(cacarb-mgcarb-mgo))   Problem # 2:  HIP PAIN, LEFT (ICD-719.45) Assessment: New  His updated medication list for this problem includes:    Adprin B 325 Mg Tabs (Aspirin buf(cacarb-mgcarb-mgo))   Problem # 3:  OTHER ABNORMAL GLUCOSE (ICD-790.29) Assessment: Deteriorated  Problem # 4:  HYPERTENSION (ICD-401.9) Assessment: Improved  His updated medication list for this problem includes:    Clonidine Hcl 0.1 Mg  Tabs (Clonidine hcl) .Marland Kitchen... Take 1 tablet by mouth every morning ;cpx when due    Tenoretic 50 50-25 Mg Tabs (Atenolol-chlorthalidone) .Marland Kitchen... Take 1 tablet by mouth once in the am;cpx when due   Complete Medication List: 1)  Clonidine Hcl 0.1 Mg Tabs (Clonidine hcl) .... Take 1 tablet by mouth every morning ;cpx when due 2)  Tenoretic 50 50-25 Mg Tabs (Atenolol-chlorthalidone) .... Take 1 tablet by mouth once in the am;cpx when due 3)  Adprin B 325 Mg Tabs (Aspirin buf(cacarb-mgcarb-mgo)) 4)  Viagra 50 Mg Tabs (Sildenafil citrate) .... Uad   Patient Instructions: 1)  It is important that you exercise regularly at least 20 minutes 5 times a week. If you develop chest pain, have severe difficulty breathing, or feel very tired , stop exercising immediately and seek medical attention. 2)  You need to lose weight. Consider a lower calorie diet and regular exercise.  3)  Take an Aspirin  every day. 4)  Check your blood sugars regularly. If your readings are usually above : or below 70 you should contact our office. 5)  It is important that your Diabetic A1c level is checked every 3 months. 6)  See your eye doctor yearly to check for diabetic eye damage. 7)  Check your feet each night for sore areas, calluses or signs of infection. 8)  Check your Blood Pressure regularly. If it is above: you should make an appointment. 9)  BMP prior to visit, ICD-9:.....................250.00 10)  HbgA1C prior to visit, ICD-9:

## 2010-11-07 NOTE — Progress Notes (Signed)
Summary: rx for back pain  Phone Note Call from Patient   Caller: Patient Summary of Call: patient states that the motrin that he is using for his back pain is not helping.  he would like to try something a little stronger because he is having a hard time exercising.  his cell number is [phone] [facility] [address]  Initial call taken by: Kern Reap CMA,  January 25, 2009 8:51 AM  Follow-up for Phone Call        trial of Darvocet-N 100, dispense 100 tablets one to two tabs t.i.d., p.r.n. refills x 1 if this medication does not help or the back pain persists.  Office Eval. Follow-up by: Roderick Pee MD,  January 25, 2009 9:22 AM  Additional Follow-up for Phone Call Additional follow up Details #1::        Rx Called In Additional Follow-up by: Kern Reap CMA,  January 25, 2009 10:48 AM

## 2010-11-07 NOTE — Assessment & Plan Note (Signed)
Summary: f/u labs/yy   Vital Signs:  Patient Profile:   60 Years Old Male Height:     71.75 inches (182.25 cm) Weight:      274 pounds Temp:     98.5 degrees F oral BP sitting:   126 / 84  (left arm)  Vitals Entered By: Doristine Devoid (Feb 09, 2008 9:15 AM)                 Chief Complaint:  follow upon labs.  History of Present Illness: Keith [name] is a 60 year old male, who comes back today for follow-up of diabetes.  He was noted on his physical exam in January to have elevation of his blood sugar.  He was in the 130 range.  At that time he weighed 276 pounds.  He was advised to get on a diet and exercise program and come back for a follow-up hemoglobin A1c.  His hemoglobin A1c is now down to 6.4.  One month after he started his exercise program.  He began having pain in his left hip.  He denies any history of trauma.  He said when he sits it hurts worse when he gets up and walks.  It feels better.  He did not take any medication.  He said no history of trauma.  He also complains of pain in that left foot.  He says he thinks the arches collapsed.    Past Medical History:    Reviewed history from 11/07/2007 and no changes required:       Hypertension       obesity       erectile dysfunction       glucose intolerance     Review of Systems      See HPI   Physical Exam  General:     Well-developed,well-nourished,in no acute distress; alert,appropriate and cooperative throughout examination Msk:     right leg, normal left leg, normal.  Skin normal.  Pulses, a very flat collapsed left arch.  He also has decreased range of motion to external rotation of the left hip.  External rotation duplicates the discomfort.  He is having.    Impression & Recommendations:  Problem # 1:  OTHER ABNORMAL GLUCOSE (ICD-790.29) Assessment: Improved  Problem # 2:  OBESITY (ICD-278.00) Assessment: Improved  Problem # 3:  HIP PAIN, LEFT (ICD-719.45) Assessment: New  His updated  medication list for this problem includes:    Adprin B 325 Mg Tabs (Aspirin buf(cacarb-mgcarb-mgo))   Complete Medication List: 1)  Clonidine Hcl 0.1 Mg Tabs (Clonidine hcl) .... Take 1 tablet by mouth every morning ;cpx when due 2)  Tenoretic 50 50-25 Mg Tabs (Atenolol-chlorthalidone) .... Take 1 tablet by mouth once in the am;cpx when due 3)  Adprin B 325 Mg Tabs (Aspirin buf(cacarb-mgcarb-mgo)) 4)  Viagra 50 Mg Tabs (Sildenafil citrate) .... Uad   Patient Instructions: 1)  begin Motrin 600 mg twice a day with food.  If after one month.  He is still having pain in the left hip.  Increase the Motrin to 600 mg 3 times a day.  If this does not help.  The pain after two months and call for reevaluation. 2)  Continue diet and exercise program.  Return in January 2010 for your Next exam sooner p.r.n.    ]

## 2010-11-07 NOTE — Assessment & Plan Note (Signed)
Summary: CPX // RS   Vital Signs:  Patient profile:   60 year old male Height:      71 inches Weight:      276 pounds BMI:     38.36 O2 Sat:      97 % Temp:     99 degrees F oral Pulse rate:   65 / minute Pulse rhythm:   regular Resp:     16 per minute BP sitting:   132 / 82  Vitals Entered By: Kern Reap CMA Duncan Dull) (October 31, 2009 10:30 AM) CC: cpx Is Patient Diabetic? No Pain Assessment Patient in pain? no        CC:  cpx.  History of Present Illness: Keith [name] is a 60 year old, married male, nonsmoker, who comes in today for physical evaluation because of underlying hypertension, obesity, erectile dysfunction, and low back pain.  His hypertension is treated with  Tenoretic 50 -- 25 daily, and clonidine, .1 daily.  BP 132/82.  He also uses Viagra 50 mg p.r.n. for rectal dysfunction.  His weight has gone up to 276.  For the past 12, months he's been taking care of his mother and father, who both died.  His father died from a CNS aneurysm.  His mother died of metastatic breast cancer, all within the last 12 months.  He's also had recurrent back pain.  We saw him for this problem.  A year ago.  X-rays are negative we get him set up for a consult but the pain went away.  He therefore canceled.  The consult.  Now the pain has recurred.  He, states it's a dull pain that it is a 3 on a scale of one to 10.  It comes and goes.  It usually occurs during the day never at night.  It starts in the left lumbar area and goes down to his left great toe.  Movement makes it worse, if he lies down, and takes Vicodin.  The pain eases off.  Muscular skeletal review of systems negative.  He gets recheck ear.  Dental care.  Colonoscopy 2007 showed polyps.  He recently got a card for redo colonoscopy.  Tetanus 2005.  He declines a flu shot  Current Medications (verified): 1)  Clonidine Hcl 0.1 Mg  Tabs (Clonidine Hcl) .... Take 1 Tablet By Mouth Every Morning ;cpx When Due 2)  Tenoretic 50  50-25 Mg  Tabs (Atenolol-Chlorthalidone) .... Take 1 Tablet By Mouth Once in The Am;cpx When Due 3)  Adprin B 325 Mg  Tabs (Aspirin Buf(Cacarb-Mgcarb-Mgo)) 4)  Viagra 50 Mg  Tabs (Sildenafil Citrate) .... Uad 5)  Vicodin Es 7.5-750 Mg Tabs (Hydrocodone-Acetaminophen) .... Take Half Tab Two Times A Day As Needed Pain  Allergies (verified): No Known Drug Allergies  Past History:  Past medical, surgical, family and social histories (including risk factors) reviewed, and no changes noted (except as noted below).  Past Medical History: Reviewed history from 11/02/2008 and no changes required. Hypertension obesity erectile dysfunction glucose intolerance Osteoarthritis  Family History: Reviewed history from 10/29/2008 and no changes required. father is 35, had a CVA when he was 50 from hypertension and smoking mother 51, diabetes, and asthma one brother in good health  mother is diabetic, had a leg amputation.  He had to put her in a nursing home.  This year.  Father died suddenly at age 39 at home unknown cause.  No autopsy  Social History: Reviewed history from 11/07/2007 and no changes required. 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 No apparent alopecia or balding. Eyes:  No corneal or conjunctival inflammation noted. EOMI. Perrla. Funduscopic exam benign, without hemorrhages, exudates or papilledema. Vision grossly normal. Ears:  External ear exam shows no significant lesions or deformities.  Otoscopic examination reveals clear canals, tympanic membranes are intact bilaterally without bulging, retraction, inflammation or discharge. Hearing is grossly normal  bilaterally. Nose:  External nasal examination shows no deformity or inflammation. Nasal mucosa are pink and moist without lesions or exudates. Mouth:  Oral mucosa and oropharynx without lesions or exudates.  Teeth in good repair. Neck:  No deformities, masses, or tenderness noted. Chest Wall:  No deformities, masses, tenderness or gynecomastia noted. Breasts:  No masses or gynecomastia noted Heart:  Normal rate and regular rhythm. S1 and S2 normal without gallop, murmur, click, rub or other extra sounds. Abdomen:  Bowel sounds positive,abdomen soft and non-tender without masses, organomegaly or hernias noted. Rectal:  No external abnormalities noted. Normal sphincter tone. No rectal masses or tenderness. Genitalia:  Testes bilaterally descended without nodularity, tenderness or masses. No scrotal masses or lesions. No penis lesions or urethral discharge. Prostate:  Prostate gland firm and smooth, no enlargement, nodularity, tenderness, mass, asymmetry or induration. Msk:  No deformity or scoliosis noted of thoracic or lumbar spine.   Pulses:  R and L carotid,radial,femoral,dorsalis pedis and posterior tibial pulses are full and equal bilaterally Extremities:  No clubbing, cyanosis, edema, or deformity noted with normal full range of motion of all joints.   Neurologic:  No cranial nerve deficits noted. Station and gait are normal. Plantar reflexes are down-going bilaterally. DTRs are symmetrical throughout. Sensory, motor and coordinative functions appear intact. Skin:  Intact without suspicious lesions or rashes Cervical Nodes:  No lymphadenopathy noted Axillary Nodes:  No palpable lymphadenopathy Inguinal Nodes:  No significant adenopathy Psych:  Cognition and judgment appear intact. Alert and cooperative with normal attention span and concentration. No apparent delusions, illusions, hallucinations   Impression & Recommendations:  Problem # 1:  HIP PAIN, LEFT (ICD-719.45) Assessment  Deteriorated  His updated medication list for this problem includes:    Adprin B 325 Mg Tabs (Aspirin buf(cacarb-mgcarb-mgo))    Vicodin Es 7.5-750 Mg Tabs (Hydrocodone-acetaminophen) .Marland Kitchen... Take half tab two times a day as needed pain  Orders: Prescription Created Electronically [phone]) Physical Therapy Referral (PT)  Problem # 2:  OBESITY (ICD-278.00) Assessment: Deteriorated  Orders: Prescription Created Electronically [phone]) EKG w/ Interpretation (93000)  Problem # 3:  ERECTILE DYSFUNCTION, MILD (ICD-302.72) Assessment: Improved  His updated medication list for this problem includes:    Viagra 50 Mg Tabs (Sildenafil citrate) ..... Uad  Orders: Prescription Created Electronically [phone])  Problem # 4:  HYPERTENSION (ICD-401.9) Assessment: Improved  His updated medication list for this problem includes:    Clonidine Hcl 0.1 Mg Tabs (Clonidine hcl) .Marland Kitchen... Take 1 tablet by mouth every morning ;cpx when due    Tenoretic 50 50-25 Mg Tabs (Atenolol-chlorthalidone) .Marland Kitchen... Take 1 tablet by mouth once in the am;cpx when due  Orders: Prescription Created Electronically [phone]) EKG w/ Interpretation (93000)  Complete Medication List: 1)  Clonidine Hcl 0.1 Mg Tabs (Clonidine hcl) .... Take 1 tablet by mouth every morning ;cpx when due 2)  Tenoretic 50 50-25 Mg Tabs (Atenolol-chlorthalidone) .Marland KitchenMarland KitchenMarland Kitchen  Take 1 tablet by mouth once in the am;cpx when due 3)  Adprin B 325 Mg Tabs (Aspirin buf(cacarb-mgcarb-mgo)) 4)  Viagra 50 Mg Tabs (Sildenafil citrate) .... Uad 5)  Vicodin Es 7.5-750 Mg Tabs (Hydrocodone-acetaminophen) .... Take half tab two times a day as needed pain  Patient Instructions: 1)  takes 600 mg of Motrin twice a day with food.  We will get you set up for a consult and physical therapy for evaluation and treatment of your back pain. 2)  Please schedule a follow-up appointment as needed. 3)  decrease your caloric intake to 1500 calories daily,.......... drink 30 ounces of water  daily............ walk 15 minutes daily 4)  Please schedule a follow-up appointment in 1 yea 5)  Please schedule a follow-up appointment in 2 months.   250.00 6)  BMP prior to visit, ICD-9: 7)  HbgA1C prior to visit, ICD-9: Prescriptions: VICODIN ES 7.5-750 MG TABS (HYDROCODONE-ACETAMINOPHEN) take half tab two times a day as needed pain  #30 x 1   Entered and Authorized by:   Roderick Pee MD   Signed by:   Roderick Pee MD on 10/31/2009   Method used:   Print then Give to Patient   RxID:   [id number] VIAGRA 50 MG  TABS (SILDENAFIL CITRATE) UAD  #6 x 11   Entered and Authorized by:   Roderick Pee MD   Signed by:   Roderick Pee MD on 10/31/2009   Method used:   Electronically to        [id number] ([facility])       [address]       [place], [place]  [address]       Ph: [phone] or [phone]       Fax: [phone]   RxID:   [id number] TENORETIC 50 50-25 MG  TABS (ATENOLOL-CHLORTHALIDONE) Take 1 tablet by mouth once in the am;cpx when due  #100 x 4   Entered and Authorized by:   Roderick Pee MD   Signed by:   Roderick Pee MD on 10/31/2009   Method used:   [facility]ically to        [id number] ([facility]       [address]       [place], [place]  [address]       Ph: [phone] or [phone]       Fax: [phone]   RxID:   [id number] CLONIDINE HCL 0.1 MG  TABS (CLONIDINE HCL) Take 1 tablet by mouth every morning ;cpx when due  #100 x 4   Entered and Authorized by:   Roderick Pee MD   Signed by:   Roderick Pee MD on 10/31/2009   Method used:   [facility]        [id number]       [address]       [place], [place]  [address]       Ph: [phone] or [phone]       Fax: [phone]   RxID:   [id number]

## 2010-11-07 NOTE — Progress Notes (Signed)
Summary: refill vicodin  Phone Note Refill Request Message from:  Fax from Pharmacy on Mar 07, 2010 12:50 PM  Refills Requested: Medication #1:  VICODIN ES 7.5-750 MG TABS take half tab two times a day as needed pain.   Last Refilled: 01/02/2010 [phone]   Method Requested: Telephone to Pharmacy Initial call taken by: Duard Brady LPN,  Mar 07, 2010 12:51 PM

## 2010-11-07 NOTE — Letter (Signed)
Summary: Work Dietitian at [employer]  [address]   [place], [place] [address]   Phone: [phone]  Fax: [phone]    Today's Date: January 02, 2010  Name of Patient: Keith [name]  The above named patient had a medical visit today at:  am / pm.  Please take this into consideration when reviewing the time away from work/school.    Special Instructions:  [  ] None  [  ] To be off the remainder of today, returning to the normal work / school schedule tomorrow.  [  ] To be off until the next scheduled appointment on ______________________.  [  ] Other ________________________________________________________________ ________________________________________________________________________   Sincerely yours,   Kern Reap CMA (AAMA)

## 2010-11-07 NOTE — Assessment & Plan Note (Signed)
Summary: 2 month follow up/cjr   Vital Signs:  Patient profile:   60 year old male Weight:      282 pounds Temp:     98.5 degrees F oral BP sitting:   124 / 82  (left arm) Cuff size:   large  Vitals Entered By: Kern Reap CMA Duncan Dull) (January 02, 2010 9:29 AM) CC: follow-up visit   CC:  follow-up visit.  History of Present Illness: Keith [name] is a 60 year old male, who comes in today for evaluation of low back pain.  We saw him a couple weeks ago with low back pain.  Examination at that time was negative.  Because of the severe pain.  We start him on physical therapy.  He's gone to physical therapy faithfully and he states his back pain is 50% decreased.  He is doing his exercises daily at home.  Neurologic review of systems negative  at his physical examination two months ago.  He had blood sugar 123.  We will recheck blood sugar and A1c today  Allergies: No Known Drug Allergies  Past History:  Past medical, surgical, family and social histories (including risk factors) reviewed for relevance to current acute and chronic problems.  Past Medical History: Reviewed history from 11/02/2008 and no changes required. Hypertension obesity erectile dysfunction glucose intolerance Osteoarthritis  Family History: Reviewed history from 10/29/2008 and no changes required. father is 38, had a CVA when he was 51 from hypertension and smoking mother 88, diabetes, and asthma one brother in good health  mother is diabetic, had a leg amputation.  He had to put her in a nursing home.  This year.  Father died suddenly at age 30 at home unknown cause.  No autopsy  Social History: Reviewed history from 11/07/2007 and no changes required. Occupation: Arts administrator at [employer], [employer] Married Former Smoker 2005 Alcohol use-no Drug use-no Regular exercise-no  Review of Systems      See HPI  Physical Exam  General:  Well-developed,well-nourished,in no acute distress; alert,appropriate and  cooperative throughout examination Msk:  No deformity or scoliosis noted of thoracic or lumbar spine.   Pulses:  R and L carotid,radial,femoral,dorsalis pedis and posterior tibial pulses are full and equal bilaterally Extremities:  No clubbing, cyanosis, edema, or deformity noted with normal full range of motion of all joints.   Neurologic:  No cranial nerve deficits noted. Station and gait are normal. Plantar reflexes are down-going bilaterally. DTRs are symmetrical throughout. Sensory, motor and coordinative functions appear intact.   Impression & Recommendations:  Problem # 1:  HIP PAIN, LEFT (ICD-719.45) Assessment Improved  His updated medication list for this problem includes:    Adprin B 325 Mg Tabs (Aspirin buf(cacarb-mgcarb-mgo))    Vicodin Es 7.5-750 Mg Tabs (Hydrocodone-acetaminophen) .Marland Kitchen... Take half tab two times a day as needed pain  Orders: Venipuncture (16109) TLB-BMP (Basic Metabolic Panel-BMET) (80048-METABOL) TLB-A1C / Hgb A1C (Glycohemoglobin) (83036-A1C)  Problem # 2:  OTHER ABNORMAL GLUCOSE (ICD-790.29) Assessment: Unchanged  Orders: Venipuncture (60454) TLB-BMP (Basic Metabolic Panel-BMET) (80048-METABOL) TLB-A1C / Hgb A1C (Glycohemoglobin) (83036-A1C)  Complete Medication List: 1)  Clonidine Hcl 0.1 Mg Tabs (Clonidine hcl) .... Take 1 tablet by mouth every morning ;cpx when due 2)  Tenoretic 50 50-25 Mg Tabs (Atenolol-chlorthalidone) .... Take 1 tablet by mouth once in the am;cpx when due 3)  Adprin B 325 Mg Tabs (Aspirin buf(cacarb-mgcarb-mgo)) 4)  Viagra 50 Mg Tabs (Sildenafil citrate) .... Uad 5)  Vicodin Es 7.5-750 Mg Tabs (Hydrocodone-acetaminophen) .... Take half tab two times  a day as needed pain  Patient Instructions: 1)  begin Motrin 600 mg twice a day with food, and one half tablet of Vicodin at bedtime as needed for severe low back pain or hip pain. 2)  Continue the diet and exercise program as outlined. 3)  we will call you when we get the  report on your lab work

## 2010-11-07 NOTE — Assessment & Plan Note (Signed)
Summary: cpx/njr   Vital Signs:  Patient Profile:   60 Years Old Male Height:     71.75 inches (182.25 cm) Weight:      276 pounds (125.45 kg) BMI:     37.83 Temp:     97.7 degrees F (36.50 degrees C) oral Pulse rate:   71 / minute BP sitting:   136 / 93  (right arm)  Pt. in pain?   no  Vitals Entered By: Arcola Jansky, RN (November 07, 2007 11:31 AM)                  Chief Complaint:  cpx and labs done.  History of Present Illness: Keith [name] is a 10 -year-old married male, who comes in today for evaluation of hypertension, obesity, erectile dysfunction,  He said overall, he said a fairly good year.  His blood pressures at home are running 130/80.  He is compliant with his medication.  He also has been experiencing erectile dysfunction.  Would like to discuss Viagra.  Is gained 9 pounds since last year.  He would like to discuss diet, exercise and weight loss.  Acute Visit History:      He denies abdominal pain, chest pain, constipation, cough, diarrhea, earache, eye symptoms, fever, genitourinary symptoms, headache, musculoskeletal symptoms, nasal discharge, nausea, rash, sinus problems, sore throat, and vomiting.           Past Medical History:    Reviewed history and no changes required:       Hypertension       obesity       erectile dysfunction   Family History:    father is 54, had a CVA when he was 50 from hypertension and smoking    mother 50, diabetes, and asthma    one brother in good health  Social History:    Reviewed history and no changes required:       Occupation: Arts administrator at [employer], [employer]       Married       Former Smoker 2005       Alcohol use-no       Drug use-no       Regular exercise-no   Risk Factors:  Tobacco use:  quit    Year quit:  2005 Drug use:  no Alcohol use:  no Exercise:  no   Review of Systems      See HPI   Physical Exam  General:     Well-developed,well-nourished,in no acute distress; alert,appropriate  and cooperative throughout examination Head:     Normocephalic and atraumatic without obvious abnormalities. No apparent alopecia or balding. Eyes:     No corneal or conjunctival inflammation noted. EOMI. Perrla. Funduscopic exam benign, without hemorrhages, exudates or papilledema. Vision grossly normal. Ears:     External ear exam shows no significant lesions or deformities.  Otoscopic examination reveals clear canals, tympanic membranes are intact bilaterally without bulging, retraction, inflammation or discharge. Hearing is grossly normal bilaterally. Nose:     External nasal examination shows no deformity or inflammation. Nasal mucosa are pink and moist without lesions or exudates. Mouth:     Oral mucosa and oropharynx without lesions or exudates.  Teeth in good repair. Neck:     No deformities, masses, or tenderness noted. Chest Wall:     No deformities, masses, tenderness or gynecomastia noted. Breasts:     No masses or gynecomastia noted Lungs:     Normal respiratory effort, chest expands symmetrically. Lungs are clear  to auscultation, no crackles or wheezes. Heart:     Normal rate and regular rhythm. S1 and S2 normal without gallop, murmur, click, rub or other extra sounds. Abdomen:     Bowel sounds positive,abdomen soft and non-tender without masses, organomegaly or hernias noted. Rectal:     No external abnormalities noted. Normal sphincter tone. No rectal masses or tenderness. Genitalia:     Testes bilaterally descended without nodularity, tenderness or masses. No scrotal masses or lesions. No penis lesions or urethral discharge. Prostate:     Prostate gland firm and smooth, no enlargement, nodularity, tenderness, mass, asymmetry or induration. Msk:     No deformity or scoliosis noted of thoracic or lumbar spine.   Pulses:     R and L carotid,radial,femoral,dorsalis pedis and posterior tibial pulses are full and equal bilaterally Extremities:     No clubbing, cyanosis,  edema, or deformity noted with normal full range of motion of all joints.   Neurologic:     No cranial nerve deficits noted. Station and gait are normal. Plantar reflexes are down-going bilaterally. DTRs are symmetrical throughout. Sensory, motor and coordinative functions appear intact. Skin:     Intact without suspicious lesions or rashes Cervical Nodes:     No lymphadenopathy noted Axillary Nodes:     No palpable lymphadenopathy Inguinal Nodes:     No significant adenopathy Psych:     Cognition and judgment appear intact. Alert and cooperative with normal attention span and concentration. No apparent delusions, illusions, hallucinations    Impression & Recommendations:  Problem # 1:  HYPERTENSION (ICD-401.9) Assessment: Improved  His updated medication list for this problem includes:    Clonidine Hcl 0.1 Mg Tabs (Clonidine hcl) .Marland Kitchen... Take 1 tablet by mouth every morning ;cpx when due    Tenoretic 50 50-25 Mg Tabs (Atenolol-chlorthalidone) .Marland Kitchen... Take 1 tablet by mouth once in the am;cpx when due   Problem # 2:  PHYSICAL EXAMINATION (ICD-V70.0) Assessment: Unchanged  Orders: EKG w/ Interpretation (93000)   Problem # 3:  ERECTILE DYSFUNCTION, MILD (ICD-302.72) Assessment: New  His updated medication list for this problem includes:    Viagra 50 Mg Tabs (Sildenafil citrate) ..... Uad   Problem # 4:  OBESITY (ICD-278.00) Assessment: Deteriorated  Problem # 5:  OTHER ABNORMAL GLUCOSE (ICD-790.29) Assessment: New  Complete Medication List: 1)  Clonidine Hcl 0.1 Mg Tabs (Clonidine hcl) .... Take 1 tablet by mouth every morning ;cpx when due 2)  Tenoretic 50 50-25 Mg Tabs (Atenolol-chlorthalidone) .... Take 1 tablet by mouth once in the am;cpx when due 3)  Adprin B 325 Mg Tabs (Aspirin buf(cacarb-mgcarb-mgo)) 4)  Viagra 50 Mg Tabs (Sildenafil citrate) .... Uad   Patient Instructions: 1)  Please schedule a follow-up appointment in 3 months. 2)  It is important that you  exercise regularly at least 20 minutes 5 times a week. If you develop chest pain, have severe difficulty breathing, or feel very tired , stop exercising immediately and seek medical attention. 3)  You need to lose weight. Consider a lower calorie diet and regular exercise.  4)  Take an Aspirin every day. 5)  Check your Blood Pressure regularly. If it is above: you should make an appointment. 6)  get a fasting blood sugar and a hemoglobin A1c1 week prior to next visit.  Code number 250.03    Prescriptions: TENORETIC 50 50-25 MG  TABS (ATENOLOL-CHLORTHALIDONE) Take 1 tablet by mouth once in the am;cpx when due  #100 x 4   Entered and Authorized  by:   Roderick Pee MD   Signed by:   Roderick Pee MD on 11/07/2007   Method used:   Electronically sent to ...       [facility]       [address]       [place], [place]  [address]       Ph: [phone]       Fax: [phone]   RxID:   [id number] CLONIDINE HCL 0.1 MG  TABS (CLONIDINE HCL) Take 1 tablet by mouth every morning ;cpx when due  #100 x 4   Entered and Authorized by:   Roderick Pee MD   Signed by:   Roderick Pee MD on 11/07/2007   Method used:   Electronically sent to ...       [facility]       [address]       [place], [place]  [address]       Ph: [phone]       Fax: [phone]   RxID:   [id number] VIAGRA 50 MG  TABS (SILDENAFIL CITRATE) UAD  #6 x 11   Entered and Authorized by:   Roderick Pee MD   Signed by:   Roderick Pee MD on 11/07/2007   Method used:   Electronically sent to ...       [facility]       [address]       [place], [place]  [address]       Ph: [phone]       Fax: [phone]   RxID:   [id number]  ]  Tetanus/Td Immunization History:    Tetanus/Td # 1:  Td (10/09/2003)

## 2010-11-07 NOTE — Progress Notes (Signed)
Summary: pt req Darvocet  Phone Note Call from Patient Call back at Home Phone [phone] Call back at [phone] cell   Caller: Patient Call For: Roderick Pee MD Summary of Call: Pt called req Darvocet N 100 100-650. Please call pt when this is ready for pick up.  Initial call taken by: Lucy Antigua,  June 23, 2009 10:59 AM  Follow-up for Phone Call        is this okay to fill? Follow-up by: Kern Reap CMA (AAMA),  June 23, 2009 2:15 PM    Prescriptions: DARVOCET-N 100 100-650 MG TABS (PROPOXYPHENE N-APAP) take one tab three times a day  #100 x 3   Entered and Authorized by:   Gordy Savers  MD   Signed by:   Gordy Savers  MD on 06/23/2009   Method used:   Print then Give to Patient   RxID:   [id number]  And in

## 2010-11-09 NOTE — Assessment & Plan Note (Signed)
Summary: CPX / RS   Vital Signs:  Patient profile:   60 year old male Height:      70.75 inches Weight:      283 pounds BMI:     39.89 Temp:     97.9 degrees F oral BP sitting:   120 / 84  (left arm) Cuff size:   regular  Vitals Entered By: Kern Reap CMA Duncan Dull) (November 02, 2010 9:37 AM) CC: cpx Is Patient Diabetic? No Pain Assessment Patient in pain? no        CC:  cpx.  History of Present Illness: Keith [name] is a 60 year old, married male, nonsmoker, who comes in today for general physical examination because of a history of hypertension erectile dysfunction, chronic low back pain, and obesity.  He currently takes clonidine, .1, and Tenoretic 50 -- 25 daily, BP 124/80.  He uses  V  50 mg p.r.n. for ED  He takes a half of a Vicodin ES two to 3 times a week in the morning because of chronic low back pain.  Advised him to take some low-dose Motrin 600 b.i.d. with food.  Tetanus 2005, colonoscopy due November 2011 he did get a call from GI, but has put it off.  He continues to struggle with his weight.  He weighs 278 pounds.  He states that his wife is also overweight.  There are no children at home, but he, states she's under a lot of stress trying to start resolving.  His father's estate.  His father was a 66 year career [employer] man, who had a talent for investing and made a lot of money in the stock market  Allergies (verified): No Known Drug Allergies  Past History:  Past medical, surgical, family and social histories (including risk factors) reviewed, and no changes noted (except as noted below). Past medical, surgical, family and social histories (including risk factors) reviewed for relevance to current acute and chronic problems.  Past Medical History: Reviewed history from 11/02/2008 and no changes required. Hypertension obesity erectile dysfunction glucose intolerance Osteoarthritis  Family History: Reviewed history from 10/29/2008 and no changes  required. father is 24, had a CVA when he was 50 from hypertension and smoking mother 27, diabetes, and asthma one brother in good health  mother is diabetic, had a leg amputation.  He had to put her in a nursing home.  This year.  Father died suddenly at age 84 at home unknown cause.  No autopsy  Social History: Reviewed history from 11/07/2007 and no changes required. Occupation: Arts administrator at [employer], [employer] Married Former Smoker 2005 Alcohol use-no Drug use-no Regular exercise-no  Review of Systems      See HPI  Physical Exam  General:  Well-developed,well-nourished,in no acute distress; alert,appropriate and cooperative throughout examination Head:  Normocephalic and atraumatic without obvious abnormalities. No apparent alopecia or balding. Eyes:  No corneal or conjunctival inflammation noted. EOMI. Perrla. Funduscopic exam benign, without hemorrhages, exudates or papilledema. Vision grossly normal. Ears:  External ear exam shows no significant lesions or deformities.  Otoscopic examination reveals clear canals, tympanic membranes are intact bilaterally without bulging, retraction, inflammation or discharge. Hearing is grossly normal bilaterally. Nose:  External nasal examination shows no deformity or inflammation. Nasal mucosa are pink and moist without lesions or exudates. Mouth:  Oral mucosa and oropharynx without lesions or exudates.  Teeth in good repair. Neck:  No deformities, masses, or tenderness noted. Chest Wall:  No deformities, masses, tenderness or gynecomastia noted. Breasts:  No masses or  gynecomastia noted Lungs:  Normal respiratory effort, chest expands symmetrically. Lungs are clear to auscultation, no crackles or wheezes. Heart:  Normal rate and regular rhythm. S1 and S2 normal without gallop, murmur, click, rub or other extra sounds. Abdomen:  Bowel sounds positive,abdomen soft and non-tender without masses, organomegaly or hernias noted............obese Rectal:   No external abnormalities noted. Normal sphincter tone. No rectal masses or tenderness. Genitalia:  Testes bilaterally descended without nodularity, tenderness or masses. No scrotal masses or lesions. No penis lesions or urethral discharge. Prostate:  Prostate gland firm and smooth, no enlargement, nodularity, tenderness, mass, asymmetry or induration. Msk:  No deformity or scoliosis noted of thoracic or lumbar spine.   Pulses:  R and L carotid,radial,femoral,dorsalis pedis and posterior tibial pulses are full and equal bilaterally Extremities:  No clubbing, cyanosis, edema, or deformity noted with normal full range of motion of all joints.   Neurologic:  No cranial nerve deficits noted. Station and gait are normal. Plantar reflexes are down-going bilaterally. DTRs are symmetrical throughout. Sensory, motor and coordinative functions appear intact. Skin:  Intact without suspicious lesions or rashes Cervical Nodes:  No lymphadenopathy noted Axillary Nodes:  No palpable lymphadenopathy Inguinal Nodes:  No significant adenopathy Psych:  Cognition and judgment appear intact. Alert and cooperative with normal attention span and concentration. No apparent delusions, illusions, hallucinations   Impression & Recommendations:  Problem # 1:  OSTEOARTHRITIS (ICD-715.90) Assessment Unchanged  His updated medication list for this problem includes:    Adprin B 325 Mg Tabs (Aspirin buf(cacarb-mgcarb-mgo))    Vicodin Es 7.5-750 Mg Tabs (Hydrocodone-acetaminophen) .Marland Kitchen... Take half tab two times a day as needed pain  Orders: Prescription Created Electronically [phone])  Problem # 2:  OBESITY (ICD-278.00) Assessment: Unchanged  Orders: Prescription Created Electronically [phone])  Problem # 3:  ERECTILE DYSFUNCTION, MILD (ICD-302.72) Assessment: Unchanged  His updated medication list for this problem includes:    Viagra 50 Mg Tabs (Sildenafil citrate) ..... Uad  Orders: Prescription Created  Electronically [phone])  Problem # 4:  HYPERTENSION (ICD-401.9) Assessment: Improved  His updated medication list for this problem includes:    Clonidine Hcl 0.1 Mg Tabs (Clonidine hcl) .Marland Kitchen... Take 1 tablet by mouth every morning ;cpx when due    Tenoretic 50 50-25 Mg Tabs (Atenolol-chlorthalidone) .Marland Kitchen... Take 1 tablet by mouth once in the am;cpx when due  Orders: Prescription Created Electronically [phone])  Problem # 5:  PHYSICAL EXAMINATION (ICD-V70.0) Assessment: Unchanged  Orders: Prescription Created Electronically [phone])  Complete Medication List: 1)  Clonidine Hcl 0.1 Mg Tabs (Clonidine hcl) .... Take 1 tablet by mouth every morning ;cpx when due 2)  Tenoretic 50 50-25 Mg Tabs (Atenolol-chlorthalidone) .... Take 1 tablet by mouth once in the am;cpx when due 3)  Adprin B 325 Mg Tabs (Aspirin buf(cacarb-mgcarb-mgo)) 4)  Viagra 50 Mg Tabs (Sildenafil citrate) .... Uad 5)  Vicodin Es 7.5-750 Mg Tabs (Hydrocodone-acetaminophen) .... Take half tab two times a day as needed pain  Patient Instructions: 1)  2000-calorie diet per day......... 20 ounces of water daily......... 20 minutes, walking daily.............. 2)  Motrin 600 mg twice daily with food for urethritis 3)  Please schedule a follow-up appointment in 1 year. Prescriptions: VICODIN ES 7.5-750 MG TABS (HYDROCODONE-ACETAMINOPHEN) take half tab two times a day as needed pain  #30 x 3   Entered and Authorized by:   Roderick Pee MD   Signed by:   Roderick Pee MD on 11/02/2010   Method used:   Print then Give to Patient  RxID:   [id number] VIAGRA 50 MG  TABS (SILDENAFIL CITRATE) UAD  #6 Tablet x 11   Entered and Authorized by:   Roderick Pee MD   Signed by:   Roderick Pee MD on 11/02/2010   Method used:   Electronically to        [id number]       [address]       [place], [place]  [address]       Ph: [phone] or [phone]       Fax: [phone]   RxID:    [id number] TENORETIC 50 50-25 MG  TABS (ATENOLOL-CHLORTHALIDONE) Take 1 tablet by mouth once in the am;cpx when due  #100 x 3   Entered and Authorized by:   Roderick Pee MD   Signed by:   Roderick Pee MD on 11/02/2010   Method used:   Electronically to        [id number]       [address]       [place], [place]  [address]       Ph: [phone] or [phone]       Fax: [phone]   RxID:   [id number] CLONIDINE HCL 0.1 MG  TABS (CLONIDINE HCL) Take 1 tablet by mouth every morning ;cpx when due  #100 Tablet x 3   Entered and Authorized by:   Roderick Pee MD   Signed by:   Roderick Pee MD on 11/02/2010   Method used:   [facility]ically to        [id number]       [address]       [place], [place]  [address]       Ph: [phone] or [phone]       Fax: [phone]   RxID:   [id number]    Orders Added: 1)  Prescription Created Electronically [[id number]] 2)  Est. Patient 40-64 years [phone]

## 2010-12-20 ENCOUNTER — Other Ambulatory Visit: Payer: Self-pay | Admitting: Family Medicine

## 2010-12-20 ENCOUNTER — Telehealth: Payer: Self-pay | Admitting: Family Medicine

## 2010-12-20 NOTE — Telephone Encounter (Signed)
Pt needs refills on tenoretic 50-25mg  and clonidine 0.1mg  call into [facility] [address] [phone]

## 2010-12-21 MED ORDER — CLONIDINE HCL ER 0.1 MG PO TB12: 1.0000 | ORAL_TABLET | Freq: Every day | ORAL | Status: DC

## 2010-12-21 MED ORDER — ATENOLOL-CHLORTHALIDONE 50-25 MG PO TABS: 1.0000 | ORAL_TABLET | Freq: Every day | ORAL | Status: DC

## 2011-01-15 ENCOUNTER — Encounter: Payer: Self-pay | Admitting: Family Medicine

## 2011-01-15 ENCOUNTER — Ambulatory Visit (INDEPENDENT_AMBULATORY_CARE_PROVIDER_SITE_OTHER): Payer: BC Managed Care – PPO | Admitting: Family Medicine

## 2011-01-15 VITALS — BP 132/84 | HR 74 | Temp 98.3°F | Ht 73.0 in | Wt 288.0 lb

## 2011-01-15 DIAGNOSIS — J301 Allergic rhinitis due to pollen: Secondary | ICD-10-CM | POA: Insufficient documentation

## 2011-01-15 MED ORDER — PREDNISONE 20 MG PO TABS: ORAL_TABLET | ORAL | Status: DC

## 2011-01-15 NOTE — Progress Notes (Signed)
  Subjective:    Patient ID: Keith [name], male    DOB: Dec 05, 1950, 60 y.o.   MRN: [id number]  HPIA. s a 60 year old male, who is not had any problem with allergies for many years until now.  He said, sneezing, and congestion, headache, postnasal drip, and cough.  He did have childhood asthma that went away when he was about 60 years of age.   Review of Systems    General and allergic review of systems otherwise negative Objective:   Physical Exam    Well-developed well-nourished, male in no acute distress.  HEENT negative except for 4+ nasal edema.  Neck was supple.  No adenopathy.  Lungs are clear    Assessment & Plan:  Allergic rhinitis,,,,,,,,,, prednisone burst and taper return p.r.n.

## 2011-01-15 NOTE — Patient Instructions (Signed)
Start the prednisone now takes 3 tablets now then starting tomorrow morning two tabs x 3 days, one x 3 days, a half x 3 days, then a half a tablet Monday, Wednesday, Friday, for a two-week taper.  Once she stopped the prednisone intake, plain Claritin, 10 mg daily in the morning until allergy season is over

## 2011-06-28 ENCOUNTER — Other Ambulatory Visit: Payer: Self-pay | Admitting: Physical Medicine and Rehabilitation

## 2011-06-28 DIAGNOSIS — M545 Low back pain, unspecified: Secondary | ICD-10-CM

## 2011-06-28 DIAGNOSIS — M5126 Other intervertebral disc displacement, lumbar region: Secondary | ICD-10-CM

## 2011-06-28 DIAGNOSIS — IMO0002 Reserved for concepts with insufficient information to code with codable children: Secondary | ICD-10-CM

## 2011-06-28 DIAGNOSIS — M48062 Spinal stenosis, lumbar region with neurogenic claudication: Secondary | ICD-10-CM

## 2011-07-13 ENCOUNTER — Other Ambulatory Visit: Payer: Self-pay | Admitting: Physical Medicine and Rehabilitation

## 2011-07-13 ENCOUNTER — Ambulatory Visit
Admission: RE | Admit: 2011-07-13 | Discharge: 2011-07-13 | Disposition: A | Discharge status: Home / Self Care | Payer: BC Managed Care – PPO | Source: Ambulatory Visit | Attending: Physical Medicine and Rehabilitation | Admitting: Physical Medicine and Rehabilitation

## 2011-07-13 DIAGNOSIS — Z77018 Contact with and (suspected) exposure to other hazardous metals: Secondary | ICD-10-CM

## 2011-07-13 DIAGNOSIS — M48062 Spinal stenosis, lumbar region with neurogenic claudication: Secondary | ICD-10-CM

## 2011-07-13 DIAGNOSIS — M545 Low back pain, unspecified: Secondary | ICD-10-CM

## 2011-07-13 DIAGNOSIS — IMO0002 Reserved for concepts with insufficient information to code with codable children: Secondary | ICD-10-CM

## 2011-07-13 DIAGNOSIS — M5126 Other intervertebral disc displacement, lumbar region: Secondary | ICD-10-CM

## 2011-10-29 ENCOUNTER — Other Ambulatory Visit (INDEPENDENT_AMBULATORY_CARE_PROVIDER_SITE_OTHER): Payer: BC Managed Care – PPO

## 2011-10-29 DIAGNOSIS — Z0000 Encounter for general adult medical examination without abnormal findings: Secondary | ICD-10-CM

## 2011-10-29 LAB — CBC WITH DIFFERENTIAL/PLATELET
Basophils Absolute: 0 10*3/uL (ref 0.0–0.1)
Basophils Relative: 0.4 % (ref 0.0–3.0)
Eosinophils Absolute: 0.1 10*3/uL (ref 0.0–0.7)
Eosinophils Relative: 2.7 % (ref 0.0–5.0)
HCT: 39.3 % (ref 39.0–52.0)
Hemoglobin: 13.3 g/dL (ref 13.0–17.0)
Lymphocytes Relative: 26.2 % (ref 12.0–46.0)
Lymphs Abs: 1.2 10*3/uL (ref 0.7–4.0)
MCHC: 33.9 g/dL (ref 30.0–36.0)
MCV: 98.5 fl (ref 78.0–100.0)
Monocytes Absolute: 0.6 10*3/uL (ref 0.1–1.0)
Monocytes Relative: 14.2 % — ABNORMAL HIGH (ref 3.0–12.0)
Neutro Abs: 2.6 10*3/uL (ref 1.4–7.7)
Neutrophils Relative %: 56.5 % (ref 43.0–77.0)
Platelets: 260 10*3/uL (ref 150.0–400.0)
RBC: 3.99 Mil/uL — ABNORMAL LOW (ref 4.22–5.81)
RDW: 14.6 % (ref 11.5–14.6)
WBC: 4.6 10*3/uL (ref 4.5–10.5)

## 2011-10-29 LAB — BASIC METABOLIC PANEL
BUN: 15 mg/dL (ref 6–23)
CO2: 25 mEq/L (ref 19–32)
Calcium: 9.2 mg/dL (ref 8.4–10.5)
Chloride: 105 mEq/L (ref 96–112)
Creatinine, Ser: 1.2 mg/dL (ref 0.4–1.5)
GFR: 82.8 mL/min (ref >60.00)
Glucose, Bld: 98 mg/dL (ref 70–99)
Potassium: 3.7 mEq/L (ref 3.5–5.1)
Sodium: 139 mEq/L (ref 135–145)

## 2011-10-29 LAB — LIPID PANEL
Cholesterol: 167 mg/dL (ref 0–200)
HDL: 36.9 mg/dL — ABNORMAL LOW (ref >39.00)
LDL Cholesterol: 120 mg/dL — ABNORMAL HIGH (ref 0–99)
Total CHOL/HDL Ratio: 5
Triglycerides: 51 mg/dL (ref 0.0–149.0)
VLDL: 10.2 mg/dL (ref 0.0–40.0)

## 2011-10-29 LAB — POCT URINALYSIS DIPSTICK
Bilirubin, UA: NEGATIVE
Blood, UA: NEGATIVE
Glucose, UA: NEGATIVE
Ketones, UA: NEGATIVE
Leukocytes, UA: NEGATIVE
Nitrite, UA: NEGATIVE
Protein, UA: NEGATIVE
Spec Grav, UA: 1.025
Urobilinogen, UA: 0.2
pH, UA: 5.5

## 2011-10-29 LAB — HEPATIC FUNCTION PANEL
ALT: 33 U/L (ref 0–53)
AST: 33 U/L (ref 0–37)
Albumin: 3.8 g/dL (ref 3.5–5.2)
Alkaline Phosphatase: 64 U/L (ref 39–117)
Bilirubin, Direct: 0 mg/dL (ref 0.0–0.3)
Total Bilirubin: 0.3 mg/dL (ref 0.3–1.2)
Total Protein: 7.2 g/dL (ref 6.0–8.3)

## 2011-10-29 LAB — TSH: TSH: 1.05 u[IU]/mL (ref 0.35–5.50)

## 2011-10-29 LAB — PSA: PSA: 0.49 ng/mL (ref 0.10–4.00)

## 2011-11-05 ENCOUNTER — Ambulatory Visit (INDEPENDENT_AMBULATORY_CARE_PROVIDER_SITE_OTHER): Payer: BC Managed Care – PPO | Admitting: Family Medicine

## 2011-11-05 ENCOUNTER — Encounter: Payer: Self-pay | Admitting: Family Medicine

## 2011-11-05 DIAGNOSIS — I10 Essential (primary) hypertension: Secondary | ICD-10-CM

## 2011-11-05 DIAGNOSIS — F528 Other sexual dysfunction not due to a substance or known physiological condition: Secondary | ICD-10-CM

## 2011-11-05 DIAGNOSIS — J301 Allergic rhinitis due to pollen: Secondary | ICD-10-CM

## 2011-11-05 DIAGNOSIS — Z0000 Encounter for general adult medical examination without abnormal findings: Secondary | ICD-10-CM

## 2011-11-05 DIAGNOSIS — E669 Obesity, unspecified: Secondary | ICD-10-CM

## 2011-11-05 MED ORDER — ATENOLOL-CHLORTHALIDONE 50-25 MG PO TABS: 1.0000 | ORAL_TABLET | Freq: Every day | ORAL | Status: DC

## 2011-11-05 MED ORDER — CLONIDINE HCL ER 0.1 MG PO TB12: 0.1000 mg | ORAL_TABLET | Freq: Every day | ORAL | Status: DC

## 2011-11-05 MED ORDER — SILDENAFIL CITRATE 100 MG PO TABS: 100.0000 mg | ORAL_TABLET | Freq: Every day | ORAL | Status: DC | PRN

## 2011-11-05 NOTE — Progress Notes (Signed)
  Subjective:    Patient ID: Keith [name], male    DOB: 1951/07/09, 61 y.o.   MRN: [id number]  HPI Keith [name] is a 61 year old, married male is who comes in today for evaluation of hypertension, allergic rhinitis.  His hypertension has been managed by Tenoretic one tablet daily, and clonidine, .1, daily, BP 110/78.  His weight is 280 pounds.  He is not following a diet or exercise program.  He is a history of allergic rhinitis in last year.  He had a lot of allergy problems.  We gave him a short course of prednisone.  The right maxillary sinus discomfort persists.  Indeed, he had a diagnostic study to rule out metallic bodies in the eye.  It was negative.  However, there was a question right maxillary disease.  Therefore, will get him set up to get the ENT consult with Dr. Ezzard Standing   Review of Systems  Constitutional: Negative.   HENT: Negative.   Eyes: Negative.   Respiratory: Negative.   Cardiovascular: Negative.   Gastrointestinal: Negative.   Genitourinary: Negative.   Musculoskeletal: Negative.   Skin: Negative.   Neurological: Negative.   Hematological: Negative.   Psychiatric/Behavioral: Negative.        Objective:   Physical Exam  Constitutional: He is oriented to person, place, and time. He appears well-developed and well-nourished.  HENT:  Head: Normocephalic and atraumatic.  Right Ear: External ear normal.  Left Ear: External ear normal.  Nose: Nose normal.  Mouth/Throat: Oropharynx is clear and moist.  Eyes: Conjunctivae and EOM are normal. Pupils are equal, round, and reactive to light.  Neck: Normal range of motion. Neck supple. No JVD present. No tracheal deviation present. No thyromegaly present.  Cardiovascular: Normal rate, regular rhythm, normal heart sounds and intact distal pulses.  Exam reveals no gallop and no friction rub.   No murmur heard. Pulmonary/Chest: Effort normal and breath sounds normal. No stridor. No respiratory distress. He has no wheezes.  He has no rales. He exhibits no tenderness.  Abdominal: Soft. Bowel sounds are normal. He exhibits no distension and no mass. There is no tenderness. There is no rebound and no guarding.  Genitourinary: Rectum normal, prostate normal and penis normal. Guaiac negative stool. No penile tenderness.  Musculoskeletal: Normal range of motion. He exhibits no edema and no tenderness.  Lymphadenopathy:    He has no cervical adenopathy.  Neurological: He is alert and oriented to person, place, and time. He has normal reflexes. No cranial nerve deficit. He exhibits normal muscle tone.  Skin: Skin is warm and dry. No rash noted. No erythema. No pallor.  Psychiatric: He has a normal mood and affect. His behavior is normal. Judgment and thought content normal.          Assessment & Plan:  Healthy male.  Hypertension.  Continue current medication.  Persistent discomfort, right maxillary sinus.  ENT consult

## 2011-11-05 NOTE — Patient Instructions (Signed)
Continue your current medications.  Takes Zyrtec 10 mg at bedtime.  See Dr. Dillard Cannon, ENT for consult

## 2011-12-02 ENCOUNTER — Other Ambulatory Visit: Payer: Self-pay | Admitting: Family Medicine

## 2011-12-20 ENCOUNTER — Other Ambulatory Visit: Payer: Self-pay | Admitting: Family Medicine

## 2012-01-19 ENCOUNTER — Emergency Department (HOSPITAL_COMMUNITY): Payer: BC Managed Care – PPO

## 2012-01-19 ENCOUNTER — Encounter (HOSPITAL_COMMUNITY): Payer: Self-pay | Admitting: Emergency Medicine

## 2012-01-19 ENCOUNTER — Ambulatory Visit (INDEPENDENT_AMBULATORY_CARE_PROVIDER_SITE_OTHER): Payer: BC Managed Care – PPO | Admitting: Emergency Medicine

## 2012-01-19 ENCOUNTER — Emergency Department (HOSPITAL_COMMUNITY)
Admission: EM | Admit: 2012-01-19 | Discharge: 2012-01-19 | Disposition: A | Discharge status: Home / Self Care | Payer: BC Managed Care – PPO | Attending: Emergency Medicine | Admitting: Emergency Medicine

## 2012-01-19 ENCOUNTER — Other Ambulatory Visit: Payer: Self-pay

## 2012-01-19 DIAGNOSIS — E669 Obesity, unspecified: Secondary | ICD-10-CM | POA: Insufficient documentation

## 2012-01-19 DIAGNOSIS — R110 Nausea: Secondary | ICD-10-CM | POA: Insufficient documentation

## 2012-01-19 DIAGNOSIS — I959 Hypotension, unspecified: Secondary | ICD-10-CM

## 2012-01-19 DIAGNOSIS — N12 Tubulo-interstitial nephritis, not specified as acute or chronic: Secondary | ICD-10-CM | POA: Insufficient documentation

## 2012-01-19 DIAGNOSIS — R630 Anorexia: Secondary | ICD-10-CM | POA: Insufficient documentation

## 2012-01-19 DIAGNOSIS — R509 Fever, unspecified: Secondary | ICD-10-CM

## 2012-01-19 DIAGNOSIS — E876 Hypokalemia: Secondary | ICD-10-CM

## 2012-01-19 DIAGNOSIS — I10 Essential (primary) hypertension: Secondary | ICD-10-CM | POA: Insufficient documentation

## 2012-01-19 DIAGNOSIS — Z79899 Other long term (current) drug therapy: Secondary | ICD-10-CM | POA: Insufficient documentation

## 2012-01-19 DIAGNOSIS — R10814 Left lower quadrant abdominal tenderness: Secondary | ICD-10-CM

## 2012-01-19 DIAGNOSIS — R109 Unspecified abdominal pain: Secondary | ICD-10-CM | POA: Insufficient documentation

## 2012-01-19 HISTORY — DX: Essential (primary) hypertension: I10

## 2012-01-19 LAB — BASIC METABOLIC PANEL
BUN: 23 mg/dL (ref 6–23)
BUN: 23 mg/dL (ref 6–23)
CO2: 28 mEq/L (ref 19–32)
CO2: 29 mEq/L (ref 19–32)
Calcium: 9.3 mg/dL (ref 8.4–10.5)
Calcium: 8.8 mg/dL (ref 8.4–10.5)
Chloride: 95 mEq/L — ABNORMAL LOW (ref 96–112)
Chloride: 96 mEq/L (ref 96–112)
Creatinine, Ser: 1.97 mg/dL — ABNORMAL HIGH (ref 0.50–1.35)
Creatinine, Ser: 1.7 mg/dL — ABNORMAL HIGH (ref 0.50–1.35)
GFR calc Af Amer: 41 mL/min — ABNORMAL LOW (ref >90)
GFR calc Af Amer: 49 mL/min — ABNORMAL LOW (ref >90)
GFR calc non Af Amer: 35 mL/min — ABNORMAL LOW (ref >90)
GFR calc non Af Amer: 42 mL/min — ABNORMAL LOW (ref >90)
Glucose, Bld: 132 mg/dL — ABNORMAL HIGH (ref 70–99)
Glucose, Bld: 128 mg/dL — ABNORMAL HIGH (ref 70–99)
Potassium: 2.5 mEq/L — CL (ref 3.5–5.1)
Potassium: 2.8 mEq/L — ABNORMAL LOW (ref 3.5–5.1)
Sodium: 135 mEq/L (ref 135–145)
Sodium: 135 mEq/L (ref 135–145)

## 2012-01-19 LAB — CBC
HCT: 36.4 % — ABNORMAL LOW (ref 39.0–52.0)
Hemoglobin: 12.8 g/dL — ABNORMAL LOW (ref 13.0–17.0)
MCH: 32.8 pg (ref 26.0–34.0)
MCHC: 35.2 g/dL (ref 30.0–36.0)
MCV: 93.3 fL (ref 78.0–100.0)
Platelets: 219 10*3/uL (ref 150–400)
RBC: 3.9 MIL/uL — ABNORMAL LOW (ref 4.22–5.81)
RDW: 14.3 % (ref 11.5–15.5)
WBC: 9.9 10*3/uL (ref 4.0–10.5)

## 2012-01-19 LAB — URINALYSIS, ROUTINE W REFLEX MICROSCOPIC
Bilirubin Urine: NEGATIVE
Glucose, UA: NEGATIVE mg/dL
Ketones, ur: NEGATIVE mg/dL
Nitrite: NEGATIVE
Protein, ur: 100 mg/dL — AB
Specific Gravity, Urine: 1.017 (ref 1.005–1.030)
Urobilinogen, UA: 0.2 mg/dL (ref 0.0–1.0)
pH: 6 (ref 5.0–8.0)

## 2012-01-19 LAB — LACTIC ACID, PLASMA: Lactic Acid, Venous: 1 mmol/L (ref 0.5–2.2)

## 2012-01-19 LAB — DIFFERENTIAL
Basophils Absolute: 0 10*3/uL (ref 0.0–0.1)
Basophils Relative: 0 % (ref 0–1)
Eosinophils Absolute: 0 10*3/uL (ref 0.0–0.7)
Eosinophils Relative: 0 % (ref 0–5)
Lymphocytes Relative: 18 % (ref 12–46)
Lymphs Abs: 1.8 10*3/uL (ref 0.7–4.0)
Monocytes Absolute: 1.5 10*3/uL — ABNORMAL HIGH (ref 0.1–1.0)
Monocytes Relative: 15 % — ABNORMAL HIGH (ref 3–12)
Neutro Abs: 6.6 10*3/uL (ref 1.7–7.7)
Neutrophils Relative %: 67 % (ref 43–77)

## 2012-01-19 LAB — URINE MICROSCOPIC-ADD ON

## 2012-01-19 MED ORDER — HYDROCODONE-ACETAMINOPHEN 5-500 MG PO TABS: 1.0000 | ORAL_TABLET | Freq: Four times a day (QID) | ORAL | Status: AC | PRN

## 2012-01-19 MED ORDER — CIPROFLOXACIN HCL 250 MG PO TABS: 250.0000 mg | ORAL_TABLET | Freq: Two times a day (BID) | ORAL | Status: AC

## 2012-01-19 MED ORDER — ONDANSETRON HCL 4 MG PO TABS: 4.0000 mg | ORAL_TABLET | Freq: Four times a day (QID) | ORAL | Status: AC

## 2012-01-19 MED ORDER — POTASSIUM CHLORIDE 10 MEQ/100ML IV SOLN
10.0000 meq | Freq: Once | INTRAVENOUS | Status: AC
  Administered 2012-01-19: 10 meq via INTRAVENOUS
  Filled 2012-01-19: qty 100

## 2012-01-19 MED ORDER — SODIUM CHLORIDE 0.9 % IV BOLUS (SEPSIS)
1000.0000 mL | Freq: Once | INTRAVENOUS | Status: AC
  Administered 2012-01-19: 1000 mL via INTRAVENOUS

## 2012-01-19 MED ORDER — FENTANYL CITRATE 0.05 MG/ML IJ SOLN
50.0000 ug | Freq: Once | INTRAMUSCULAR | Status: AC
  Administered 2012-01-19: 50 ug via INTRAVENOUS
  Filled 2012-01-19: qty 2

## 2012-01-19 MED ORDER — ONDANSETRON HCL 4 MG/2ML IJ SOLN
4.0000 mg | Freq: Once | INTRAMUSCULAR | Status: AC
  Administered 2012-01-19: 4 mg via INTRAVENOUS
  Filled 2012-01-19: qty 2

## 2012-01-19 MED ORDER — POTASSIUM CHLORIDE ER 10 MEQ PO TBCR: 20.0000 meq | EXTENDED_RELEASE_TABLET | Freq: Two times a day (BID) | ORAL | Status: DC

## 2012-01-19 MED ORDER — CIPROFLOXACIN IN D5W 400 MG/200ML IV SOLN
400.0000 mg | Freq: Once | INTRAVENOUS | Status: AC
  Administered 2012-01-19: 400 mg via INTRAVENOUS
  Filled 2012-01-19: qty 200

## 2012-01-19 NOTE — ED Notes (Signed)
Pt finished contrast for CT

## 2012-01-19 NOTE — Discharge Instructions (Signed)
Take antibiotic in complete course and stay very well hydrated. Take potassium supplement as directed. Following up with Dr. Tawanna Cooler in office this coming week is VERY important for recheck of urine, potassium and kidney function. Take zofran as needed for nausea and hydrocodone-acetaminophen as needed for pain but do not drive or operate machinery with use. Return to ER at any time for changing or worsening of symptoms.   Pyelonephritis, Adult Pyelonephritis is a kidney infection. In general, there are 2 main types of pyelonephritis:  Infections that come on quickly without any warning (acute pyelonephritis).   Infections that persist for a long period of time (chronic pyelonephritis).  CAUSES  Two main causes of pyelonephritis are:  Bacteria traveling from the bladder to the kidney. This is a problem especially in pregnant women. The urine in the bladder can become filled with bacteria from multiple causes, including:   Inflammation of the prostate gland (prostatitis).   Sexual intercourse in females.   Bladder infection (cystitis).   Bacteria traveling from the bloodstream to the tissue part of the kidney.  Problems that may increase your risk of getting a kidney infection include:  Diabetes.   Kidney stones or bladder stones.   Cancer.   Catheters placed in the bladder.   Other abnormalities of the kidney or ureter.  SYMPTOMS   Abdominal pain.   Pain in the side or flank area.   Fever.   Chills.   Upset stomach.   Blood in the urine (dark urine).   Frequent urination.   Strong or persistent urge to urinate.   Burning or stinging when urinating.  DIAGNOSIS  Your caregiver may diagnose your kidney infection based on your symptoms. A urine sample may also be taken. TREATMENT  In general, treatment depends on how severe the infection is.   If the infection is mild and caught early, your caregiver may treat you with oral antibiotics and send you home.   If the  infection is more severe, the bacteria may have gotten into the bloodstream. This will require intravenous (IV) antibiotics and a hospital stay. Symptoms may include:   High fever.   Severe flank pain.   Shaking chills.   Even after a hospital stay, your caregiver may require you to be on oral antibiotics for a period of time.   Other treatments may be required depending upon the cause of the infection.  HOME CARE INSTRUCTIONS   Take your antibiotics as directed. Finish them even if you start to feel better.   Make an appointment to have your urine checked to make sure the infection is gone.   Drink enough fluids to keep your urine clear or pale yellow.   Take medicines for the bladder if you have urgency and frequency of urination as directed by your caregiver.  SEEK IMMEDIATE MEDICAL CARE IF:   You have a fever.   You are unable to take your antibiotics or fluids.   You develop shaking chills.   You experience extreme weakness or fainting.   There is no improvement after 2 days of treatment.  MAKE SURE YOU:  Understand these instructions.   Will watch your condition.   Will get help right away if you are not doing well or get worse.  Document Released: 09/24/2005 Document Revised: 09/13/2011 Document Reviewed: 02/28/2011 [facility] Patient Information 2012 [place], [place].  Hypokalemia Hypokalemia means a low potassium level in the blood.Potassium is an electrolyte that helps regulate the amount of fluid in the body. It  also stimulates muscle contraction and maintains a stable acid-base balance.Most of the body's potassium is inside of cells, and only a very small amount is in the blood. Because the amount in the blood is so small, minor changes can have big effects. PREPARATION FOR TEST Testing for potassium requires taking a blood sample taken by needle from a vein in the arm. The skin is cleaned thoroughly before the sample is drawn. There is no other special  preparation needed. NORMAL VALUES Potassium levels below 3.5 mEq/L are abnormally low. Levels above 5.1 mEq/L are abnormally high. Ranges for normal findings may vary among different laboratories and hospitals. You should always check with your doctor after having lab work or other tests done to discuss the meaning of your test results and whether your values are considered within normal limits. MEANING OF TEST  Your caregiver will go over the test results with you and discuss the importance and meaning of your results, as well as treatment options and the need for additional tests, if necessary. A potassium level is frequently part of a routine medical exam. It is usually included as part of a whole "panel" of tests for several blood salts (such as Sodium and Chloride). It may be done as part of follow-up when a low potassium level was found in the past or other blood salts are suspected of being out of balance. A low potassium level might be suspected if you have one or more of the following:  Symptoms of weakness.   Abnormal heart rhythms.   High blood pressure and are taking medication to control this, especially water pills (diuretics).   Kidney disease that can affect your potassium level .   Diabetes requiring the use of insulin. The potassium may fall after taking insulin, especially if the diabetes had been out of control for a while.   A condition requiring the use of cortisone-type medication or certain types of antibiotics.   Vomiting and/or diarrhea for more than a day or two.   A stomach or intestinal condition that may not permit appropriate absorption of potassium.   Fainting episodes.   Mental confusion.  OBTAINING TEST RESULTS It is your responsibility to obtain your test results. Ask the lab or department performing the test when and how you will get your results.  Please contact your caregiver directly if you have not received the results within one week. At that  time, ask if there is anything different or new you should be doing in relation to the results. TREATMENT Hypokalemia can be treated with potassium supplements taken by mouth and/or adjustments in your current medications. A diet high in potassium is also helpful. Foods with high potassium content are:  Peas, lentils, lima beans, nuts, and dried fruit.   Whole grain and bran cereals and breads.   Fresh fruit, vegetables (bananas, cantaloupe, grapefruit, oranges, tomatoes, honeydew melons, potatoes).   Orange and tomato juices.   Meats. If potassium supplement has been prescribed for you today or your medications have been adjusted, see your personal caregiver in time02 for a re-check.  SEEK MEDICAL CARE IF:  There is a feeling of worsening weakness.   You experience repeated chest palpitations.   You are diabetic and having difficulty keeping your blood sugars in the normal range.   You are experiencing vomiting and/or diarrhea.   You are having difficulty with any of your regular medications.  SEEK IMMEDIATE MEDICAL CARE IF:  You experience chest pain, shortness of breath, or episodes  of dizziness.   You have been having vomiting or diarrhea for more than 2 days.   You have a fainting episode.  MAKE SURE YOU:   Understand these instructions.   Will watch your condition.   Will get help right away if you are not doing well or get worse.  Document Released: 09/24/2005 Document Revised: 09/13/2011 Document Reviewed: 09/04/2008 [facility] Patient Information 2012 [place], [place].

## 2012-01-19 NOTE — ED Notes (Signed)
Pt brought to ED from [facility] today by [facility].  Pt's systolic BP at Dr's office was 90. Pt was given of Normal Saline at office prior to EMS arrival. Pt's BP with EMS was 110/60.  Pt reports nausea x5 days and abdominal pain x2 days in LLQ. Abdominal pain is a 3 out of 10 and is constant.  LLQ is tender to palpation.

## 2012-01-19 NOTE — ED Notes (Signed)
PA at bedside.

## 2012-01-19 NOTE — Progress Notes (Signed)
  Subjective:    Patient ID: Keith [name], male    DOB: [birth year], 61 y.o.   MRN: [id number]  HPI 61 yo AAM c/o 2 day h/o generalized bodyaches, abdominal pain, nausea, nasal congestion. Slight dizziness this am.  Fever X 2 days to ~101. BMs loose, 2BMs since symptoms started.  Denies cough, SOB, and no CP. Due to BP patient was given priority ahead of all other patients. Had CPE in January 1013 with Dr. Tawanna Cooler, everything was normal, no diabetes. Scheduled for colonoscopy in 2 months. Review of Systems  All other systems reviewed and are negative.      Objective:   Physical Exam  Constitutional: He is oriented to person, place, and time. He appears well-developed and well-nourished.  HENT:  Head: Normocephalic and atraumatic.  Neck: Normal range of motion. Neck supple.  Cardiovascular: Normal rate, regular rhythm, normal heart sounds and intact distal pulses.   Pulmonary/Chest: Effort normal and breath sounds normal.  Abdominal: Soft. He exhibits no mass. There is tenderness (LLQ ). There is no rebound (Negative McBurney's, Neg Murphy's) and no guarding.  Neurological: He is alert and oriented to person, place, and time.  Skin:       clammy to the touch  Psychiatric: He has a normal mood and affect. His behavior is normal. Thought content normal.      Assessment & Plan:  Hypotension LLQ pain IV started and sent out via EMS to hospital for further testing. Patient was seen and examined by myself and Earl Lites, MD.

## 2012-01-19 NOTE — ED Provider Notes (Signed)
History     CSN: [id number]  Arrival date & time 01/19/12  1411   First MD Initiated Contact with Patient 01/19/12 1436      Chief Complaint  Patient presents with  . Abdominal Pain  . Fever    (Consider location/radiation/quality/duration/timing/severity/associated sxs/prior treatment) HPI  Patient who states he has hx of HTN for which he takes medicaitons but states he has no other known medical problems and takes no other medications on a regular basis presents to ER complaining of a one week hx of gradual onset nausea, lower abdominal pain, fevers, and chills. Patient states symptoms began as mild nausea about a week about with increasing nausea and decreased appetite throughout the week but then a few day hx of gradual onset waxing and waning chills with a fever noted of tmax 100.8 at home last night and LLQ pain that is intermittent. Patient states he went to [facility] for evaluation and when blood pressure was checked was found to be hypotensive and sent to ER for further evaluation. Patient states that has had decreased PO intake due to nausea but has continued to take BP medication. However patient states that despite taking BP his BP "usually runs high even with medication." Patient denies CP, SOB, cough, vomiting, diarrhea, dysuria, hematuria, blood in stool, back pain or flank pain. He denies hx of abdominal surgeries. He denies aggravating or alleviating factors. He was given IV fluids PTA by EMS but has not taken anything for pain or nausea PTA.   Past Medical History  Diagnosis Date  . Hypertension     No past surgical history on file.  No family history on file.  History  Substance Use Topics  . Smoking status: Former Games developer  . Smokeless tobacco: Not on file  . Alcohol Use: Not on file      Review of Systems  All other systems reviewed and are negative.    Allergies  Review of patient's allergies indicates no known allergies.  Home Medications   Current  Outpatient Rx  Name Route Sig Dispense Refill  . ATENOLOL-CHLORTHALIDONE 50-25 MG PO TABS Oral Take 1 tablet by mouth daily. 100 tablet 3  . ATENOLOL-CHLORTHALIDONE 50-25 MG PO TABS  TAKE 1 TABLET EVERY MORNING 90 tablet 3  . CLONIDINE HCL 0.1 MG PO TABS  TAKE 1 TABLET (0.1 MG TOTAL) BY MOUTH DAILY. 100 tablet 3  . CLONIDINE HCL ER 0.1 MG PO TB12 Oral Take 1 tablet (0.1 mg total) by mouth daily. 100 tablet 3  . PREDNISONE 20 MG PO TABS  Two tabs x 3 days, one x 3 days, a half a pill x 3 days, then half a tab Monday, Wednesday, Friday, for two weeks.  He 30 tablet 1  . SILDENAFIL CITRATE 100 MG PO TABS Oral Take 1 tablet (100 mg total) by mouth daily as needed for erectile dysfunction. 6 tablet 11    BP 101/55  Pulse 62  Temp(Src) 98.1 F (36.7 C) (Oral)  Resp 18  SpO2 100%  Physical Exam  Nursing note and vitals reviewed. Constitutional: He is oriented to person, place, and time. He appears well-developed and well-nourished. No distress.       obese  HENT:  Head: Normocephalic and atraumatic.  Eyes: Conjunctivae are normal.  Neck: Normal range of motion. Neck supple.  Cardiovascular: Normal rate, regular rhythm, normal heart sounds and intact distal pulses.  Exam reveals no gallop and no friction rub.   No murmur heard. Pulmonary/Chest: Effort normal and  breath sounds normal. No respiratory distress. He has no wheezes. He has no rales. He exhibits no tenderness.  Abdominal: Soft. Bowel sounds are normal. He exhibits no distension and no mass. There is tenderness. There is no rebound and no guarding.       Mild TTP of LLQ but abdomen obese. No rigidity or guarding. No peritoneal signs.   Genitourinary: Rectum normal and prostate normal.  Musculoskeletal: Normal range of motion. He exhibits no edema and no tenderness.  Neurological: He is alert and oriented to person, place, and time.  Skin: Skin is warm and dry. No rash noted. He is not diaphoretic. No erythema.  Psychiatric: He has  a normal mood and affect.    ED Course  Procedures (including critical care time)  IV fluids, IV fentanyl   IV potassium  Labs Reviewed  CBC - Abnormal; Notable for the following:    RBC 3.90 (*)    Hemoglobin 12.8 (*)    HCT 36.4 (*)    All other components within normal limits  DIFFERENTIAL - Abnormal; Notable for the following:    Monocytes Relative 15 (*)    Monocytes Absolute 1.5 (*)    All other components within normal limits  BASIC METABOLIC PANEL - Abnormal; Notable for the following:    Potassium 2.5 (*)    Chloride 95 (*)    Glucose, Bld 132 (*)    Creatinine, Ser 1.97 (*)    GFR calc non Af Amer 35 (*)    GFR calc Af Amer 41 (*)    All other components within normal limits  URINALYSIS, ROUTINE W REFLEX MICROSCOPIC - Abnormal; Notable for the following:    APPearance TURBID (*)    Hgb urine dipstick LARGE (*)    Protein, ur 100 (*)    Leukocytes, UA MODERATE (*)    All other components within normal limits  URINE MICROSCOPIC-ADD ON - Abnormal; Notable for the following:    Bacteria, UA MANY (*)    All other components within normal limits  LACTIC ACID, PLASMA   Ct Abdomen Pelvis Wo Contrast  01/19/2012  *RADIOLOGY REPORT*  Clinical Data: Nausea and vomiting, left lower quadrant pain  CT ABDOMEN AND PELVIS WITHOUT CONTRAST  Technique:  Multidetector CT imaging of the abdomen and pelvis was performed following the standard protocol without intravenous contrast.  Comparison: None.  Findings: Lung bases are clear.  No pericardial fluid.  Non-IV contrast images demonstrate no focal hepatic lesion.  The gallbladder, pancreas, spleen, and adrenal glands are normal. There is enlargement of the left adrenal gland which has Hounsfield units consistent with benign adrenal adenoma.  There is mild perinephric stranding along the left.  No evidence of left ureterolithiasis or hydroureter.  There is mild perinephric stranding on the right but to a lesser degree.  The stomach,  small bowel, appendix, and cecum are normal.  There are diverticula of the descending colon and sigmoid colon without evidence of acute inflammation.  Abdominal aorta is normal caliber.  No retroperitoneal or peritoneal lymphadenopathy.  There is no distal ureteral stones or bladder stones.  Prostate gland is normal.  No pelvic lymphadenopathy. Review of  bone windows demonstrates no aggressive osseous lesions.  There is a large sclerotic lesion within the right sacrum which is likely a giant benign enostosis.  There are degenerative changes of the lumbar spine.  IMPRESSION:  1.  Mild perinephric stranding on the left without evidence of obstruction or ureteral calculi.  Possible this could represent  the sequelae of a passed stone. 2.  Sigmoid diverticulosis without clear evidence of acute diverticulitis.  Cannot exclude mild early diverticulitis. 3.  Sclerotic lesion within the right sacrum.  In the absence of prostate cancer, this is likely benign. 4.  Benign left adrenal adenoma.  Original Report Authenticated By: Genevive Bi, M.D.     1. Pyelonephritis   2. Hypokalemia       MDM  Signs and symptoms most consistent with pyelo with improved creatine and potassium after IV fluids and potassium supplementation. BP has improved and been stable. Patient states feels much better with improved symptoms. No signs or symptoms of prostatitis. Abdomen not acute. Afebrile. Patient is agreeable to OP treatment with close follow up with PCP for recheck of urine, creatinine and potassium with strict precautions given for return. Patient voices his understanding and is agreeable to plan.         Jenness Corner, [place] 01/19/12 2034

## 2012-01-20 NOTE — ED Provider Notes (Signed)
Medical screening examination/treatment/procedure(s) were conducted as a shared visit with non-physician practitioner(s) and myself.  I personally evaluated the patient during the encounter  Abdominal pain, fever. Min LLQ pain. No CVAT. Not c/o UTI si/sx but U/A c/w infection and stranding left kidney. Will treat for pyelonephritis. Stable for discharge home.     Forbes Cellar, MD 01/20/12 1510

## 2012-01-21 LAB — URINE CULTURE
Colony Count: >=100000
Culture  Setup Time: 201304132045

## 2012-01-22 NOTE — ED Notes (Signed)
+   cipro-sensitive to same-chart appended per protocol MD.

## 2012-01-24 ENCOUNTER — Encounter: Payer: Self-pay | Admitting: Family Medicine

## 2012-01-25 ENCOUNTER — Encounter: Payer: Self-pay | Admitting: Family Medicine

## 2012-01-25 ENCOUNTER — Ambulatory Visit (INDEPENDENT_AMBULATORY_CARE_PROVIDER_SITE_OTHER): Payer: BC Managed Care – PPO | Admitting: Family Medicine

## 2012-01-25 VITALS — BP 116/80 | HR 96 | Temp 98.9°F | Wt 275.0 lb

## 2012-01-25 DIAGNOSIS — N12 Tubulo-interstitial nephritis, not specified as acute or chronic: Secondary | ICD-10-CM

## 2012-01-25 LAB — BASIC METABOLIC PANEL
BUN: 19 mg/dL (ref 6–23)
CO2: 26 mEq/L (ref 19–32)
Calcium: 9.2 mg/dL (ref 8.4–10.5)
Chloride: 107 mEq/L (ref 96–112)
Creatinine, Ser: 1.2 mg/dL (ref 0.4–1.5)
GFR: 81.91 mL/min (ref >60.00)
Glucose, Bld: 103 mg/dL — ABNORMAL HIGH (ref 70–99)
Potassium: 4.1 mEq/L (ref 3.5–5.1)
Sodium: 145 mEq/L (ref 135–145)

## 2012-01-25 LAB — POCT URINALYSIS DIPSTICK
Bilirubin, UA: NEGATIVE
Glucose, UA: NEGATIVE
Ketones, UA: NEGATIVE
Leukocytes, UA: NEGATIVE
Nitrite, UA: NEGATIVE
Spec Grav, UA: 1.025
Urobilinogen, UA: 0.2
pH, UA: 6

## 2012-01-25 NOTE — Progress Notes (Signed)
Addended by: Aniceto Boss A on: 01/25/2012 12:28 PM   Modules accepted: Orders

## 2012-01-25 NOTE — Progress Notes (Signed)
  Subjective:    Patient ID: Keith [name], male    DOB: 03-29-1951, 61 y.o.   MRN: [id number]  HPI Here to follow up an ER visit on 01-19-12 when he was diagnosed with pyelonephritis. He had been having LLQ pains, fevers, and nausea. He was given IV fluids and started on Cipro. Today he feels totally back to normal with no complaints at all. Drinking plenty of water. The urine culture obtained in the ER grew E. Coli that is sensitive to Cipro.    Review of Systems  Constitutional: Negative.   Respiratory: Negative.   Cardiovascular: Negative.   Gastrointestinal: Negative.   Genitourinary: Negative.        Objective:   Physical Exam  Constitutional: He appears well-developed and well-nourished.  Abdominal: Soft. Bowel sounds are normal. He exhibits no distension and no mass. There is no tenderness. There is no rebound and no guarding.          Assessment & Plan:  He seems to be doing well. Get  A BMET today. He will finish out the Cipro.

## 2012-01-29 NOTE — Progress Notes (Signed)
Quick Note:  Pt informed ______ 

## 2012-10-09 ENCOUNTER — Ambulatory Visit (INDEPENDENT_AMBULATORY_CARE_PROVIDER_SITE_OTHER): Payer: BC Managed Care – PPO | Admitting: Physician Assistant

## 2012-10-09 VITALS — BP 124/74 | HR 75 | Temp 98.3°F | Resp 17 | Ht 71.0 in | Wt 261.0 lb

## 2012-10-09 DIAGNOSIS — J329 Chronic sinusitis, unspecified: Secondary | ICD-10-CM

## 2012-10-09 MED ORDER — AMOXICILLIN 875 MG PO TABS: 875.0000 mg | ORAL_TABLET | Freq: Two times a day (BID) | ORAL | Status: DC

## 2012-10-09 MED ORDER — GUAIFENESIN ER 1200 MG PO TB12: 1.0000 | ORAL_TABLET | Freq: Two times a day (BID) | ORAL | Status: DC

## 2012-10-09 NOTE — Progress Notes (Signed)
   [address], [place] [address]   Phone [phone]  Subjective:    Patient ID: Keith [name], male    DOB: [birth year], 62 y.o.   MRN: [id number]  HPI  Pt presents to clinic with about 2 wk h/o sinus pressure and yellow rhinorrhea.  Started about 2 wks ago with a cold that he got after a visit from his grandchildren.  The cold symptoms resolved after a few days but the pressure has continued to worsen.  It is only on the R side of his face.  He has been using OTC remedies that typically make his symptoms go away but they have not worked this time.  Some headache.  Review of Systems  Constitutional: Negative for fever and chills.  HENT: Positive for congestion, rhinorrhea (thick yellow) and sinus pressure (worse in the am). Negative for sore throat and dental problem (no teeth pain).   Respiratory: Negative for cough.   Neurological: Positive for headaches. Negative for dizziness.       Objective:   Physical Exam  Vitals reviewed. Constitutional: He is oriented to person, place, and time. He appears well-developed and well-nourished.  HENT:  Head: Normocephalic and atraumatic.  Right Ear: Hearing, tympanic membrane, external ear and ear canal normal.  Left Ear: Hearing, tympanic membrane, external ear and ear canal normal.  Nose: Mucosal edema (on the R side only) and rhinorrhea (thick yellow on the R side) present. Right sinus exhibits no maxillary sinus tenderness and no frontal sinus tenderness. Left sinus exhibits no maxillary sinus tenderness and no frontal sinus tenderness.  Eyes: Conjunctivae normal are normal.  Neck: Neck supple.  Cardiovascular: Normal rate, regular rhythm and normal heart sounds.   Pulmonary/Chest: Effort normal and breath sounds normal.  Lymphadenopathy:    He has no cervical adenopathy.  Neurological: He is alert and oriented to person, place, and time.  Skin: Skin is warm and dry.  Psychiatric: He has a normal mood and affect. His behavior is  normal. Judgment and thought content normal.       Assessment & Plan:   1. Sinusitis  Guaifenesin (MUCINEX MAXIMUM STRENGTH) 1200 MG TB12, amoxicillin (AMOXIL) 875 MG tablet   Due to the significant swelling of his R nasal turbinates pt may need prednisone - but we will try an antibiotic 1st due to no sinus tenderness but d/w pt when to call if we need to possible add this.  He agrees and understands the above.

## 2012-10-09 NOTE — Patient Instructions (Addendum)
Antibiotics to the pharmacy - please take all of medications. If the pressure does not improved we may need to start prednisone to hep relieve the pressure. Cool mist humidifer to help with congestion.

## 2012-10-30 ENCOUNTER — Other Ambulatory Visit (INDEPENDENT_AMBULATORY_CARE_PROVIDER_SITE_OTHER): Payer: BC Managed Care – PPO

## 2012-10-30 DIAGNOSIS — Z0000 Encounter for general adult medical examination without abnormal findings: Secondary | ICD-10-CM

## 2012-10-30 LAB — BASIC METABOLIC PANEL
BUN: 23 mg/dL (ref 6–23)
CO2: 28 mEq/L (ref 19–32)
Calcium: 9.8 mg/dL (ref 8.4–10.5)
Chloride: 106 mEq/L (ref 96–112)
Creatinine, Ser: 1 mg/dL (ref 0.4–1.5)
GFR: 93.6 mL/min (ref >60.00)
Glucose, Bld: 85 mg/dL (ref 70–99)
Potassium: 3.8 mEq/L (ref 3.5–5.1)
Sodium: 142 mEq/L (ref 135–145)

## 2012-10-30 LAB — POCT URINALYSIS DIPSTICK
Bilirubin, UA: NEGATIVE
Blood, UA: NEGATIVE
Glucose, UA: NEGATIVE
Ketones, UA: NEGATIVE
Nitrite, UA: NEGATIVE
Protein, UA: NEGATIVE
Spec Grav, UA: 1.02
Urobilinogen, UA: 0.2
pH, UA: 6.5

## 2012-10-30 LAB — HEPATIC FUNCTION PANEL
ALT: 27 U/L (ref 0–53)
AST: 33 U/L (ref 0–37)
Albumin: 3.5 g/dL (ref 3.5–5.2)
Alkaline Phosphatase: 95 U/L (ref 39–117)
Bilirubin, Direct: 0 mg/dL (ref 0.0–0.3)
Total Bilirubin: 0.2 mg/dL — ABNORMAL LOW (ref 0.3–1.2)
Total Protein: 7.6 g/dL (ref 6.0–8.3)

## 2012-10-30 LAB — LIPID PANEL
Cholesterol: 184 mg/dL (ref 0–200)
HDL: 44.6 mg/dL (ref >39.00)
LDL Cholesterol: 124 mg/dL — ABNORMAL HIGH (ref 0–99)
Total CHOL/HDL Ratio: 4
Triglycerides: 79 mg/dL (ref 0.0–149.0)
VLDL: 15.8 mg/dL (ref 0.0–40.0)

## 2012-10-30 LAB — CBC WITH DIFFERENTIAL/PLATELET
Basophils Absolute: 0 10*3/uL (ref 0.0–0.1)
Basophils Relative: 0.6 % (ref 0.0–3.0)
Eosinophils Absolute: 0.3 10*3/uL (ref 0.0–0.7)
Eosinophils Relative: 4.8 % (ref 0.0–5.0)
HCT: 37.4 % — ABNORMAL LOW (ref 39.0–52.0)
Hemoglobin: 12.3 g/dL — ABNORMAL LOW (ref 13.0–17.0)
Lymphocytes Relative: 24.2 % (ref 12.0–46.0)
Lymphs Abs: 1.5 10*3/uL (ref 0.7–4.0)
MCHC: 33 g/dL (ref 30.0–36.0)
MCV: 92.2 fl (ref 78.0–100.0)
Monocytes Absolute: 1 10*3/uL (ref 0.1–1.0)
Monocytes Relative: 16.3 % — ABNORMAL HIGH (ref 3.0–12.0)
Neutro Abs: 3.4 10*3/uL (ref 1.4–7.7)
Neutrophils Relative %: 54.1 % (ref 43.0–77.0)
Platelets: 330 10*3/uL (ref 150.0–400.0)
RBC: 4.06 Mil/uL — ABNORMAL LOW (ref 4.22–5.81)
RDW: 15.8 % — ABNORMAL HIGH (ref 11.5–14.6)
WBC: 6.2 10*3/uL (ref 4.5–10.5)

## 2012-10-30 LAB — TSH: TSH: 1.29 u[IU]/mL (ref 0.35–5.50)

## 2012-10-30 LAB — PSA: PSA: 1.19 ng/mL (ref 0.10–4.00)

## 2012-11-06 ENCOUNTER — Encounter: Payer: Self-pay | Admitting: Family Medicine

## 2012-11-06 ENCOUNTER — Ambulatory Visit (INDEPENDENT_AMBULATORY_CARE_PROVIDER_SITE_OTHER): Payer: BC Managed Care – PPO | Admitting: Family Medicine

## 2012-11-06 VITALS — BP 150/90 | Temp 99.1°F | Ht 70.5 in | Wt 268.0 lb

## 2012-11-06 DIAGNOSIS — F528 Other sexual dysfunction not due to a substance or known physiological condition: Secondary | ICD-10-CM

## 2012-11-06 DIAGNOSIS — J301 Allergic rhinitis due to pollen: Secondary | ICD-10-CM

## 2012-11-06 DIAGNOSIS — M48061 Spinal stenosis, lumbar region without neurogenic claudication: Secondary | ICD-10-CM | POA: Insufficient documentation

## 2012-11-06 DIAGNOSIS — I10 Essential (primary) hypertension: Secondary | ICD-10-CM

## 2012-11-06 DIAGNOSIS — E669 Obesity, unspecified: Secondary | ICD-10-CM

## 2012-11-06 DIAGNOSIS — M1990 Unspecified osteoarthritis, unspecified site: Secondary | ICD-10-CM

## 2012-11-06 DIAGNOSIS — R7309 Other abnormal glucose: Secondary | ICD-10-CM

## 2012-11-06 MED ORDER — SILDENAFIL CITRATE 100 MG PO TABS: 100.0000 mg | ORAL_TABLET | Freq: Every day | ORAL | Status: DC | PRN

## 2012-11-06 MED ORDER — POTASSIUM CHLORIDE ER 10 MEQ PO TBCR: 20.0000 meq | EXTENDED_RELEASE_TABLET | Freq: Two times a day (BID) | ORAL | Status: DC

## 2012-11-06 MED ORDER — CLONIDINE HCL 0.1 MG PO TABS: ORAL_TABLET | ORAL | Status: DC

## 2012-11-06 MED ORDER — ATENOLOL-CHLORTHALIDONE 50-25 MG PO TABS: ORAL_TABLET | ORAL | Status: DC

## 2012-11-06 MED ORDER — FLUTICASONE PROPIONATE 50 MCG/ACT NA SUSP: NASAL | Status: DC

## 2012-11-06 NOTE — Patient Instructions (Signed)
Stay on a caffeine free diet to improve your water works  Continue other medications  The Maxide would take on the Motrin would be 400 mg twice daily with food  Consider inversion table  This year work on your walking and weight loss  Followup in 1 year sooner if any problem

## 2012-11-06 NOTE — Progress Notes (Signed)
  Subjective:    Patient ID: Keith [name], male    DOB: April 03, 1951, 62 y.o.   MRN: [id number]  HPI Keith [name] is a 60-year-old married male nonsmoker who comes in today for general physical examination because of a history of hypertension, osteoarthritis, erectile dysfunction, glucose intolerance secondary to obesity, allergic rhinitis, and a new problem of spinal stenosis  He's recently been treated by Dr. Freida Busman,,,,,,,,,,,,, a relative,,,,,,,,,,, for spinal stenosis. He's had an epidural steroid injection but it didn't help very much.  He takes Tenoretic 1 daily for hypertension BP at home 130/80  He also takes Motrin 400 mg twice a day when necessary  He takes 20 mEq of potassium twice daily and Viagra when necessary for erectile dysfunction  Weight is steady at 268  He's also having symptoms of BPH he does consume a lot of caffeine  Works full-time Quarry manager at [employer]   Review of Systems  Constitutional: Negative.   HENT: Negative.   Eyes: Negative.   Respiratory: Negative.   Cardiovascular: Negative.   Gastrointestinal: Negative.   Genitourinary: Negative.   Musculoskeletal: Negative.   Skin: Negative.   Neurological: Negative.   Hematological: Negative.   Psychiatric/Behavioral: Negative.        Objective:   Physical Exam  Constitutional: He is oriented to person, place, and time. He appears well-developed and well-nourished.  HENT:  Head: Normocephalic and atraumatic.  Right Ear: External ear normal.  Left Ear: External ear normal.  Nose: Nose normal.  Mouth/Throat: Oropharynx is clear and moist.  Eyes: Conjunctivae normal and EOM are normal. Pupils are equal, round, and reactive to light.  Neck: Normal range of motion. Neck supple. No JVD present. No tracheal deviation present. No thyromegaly present.  Cardiovascular: Normal rate, regular rhythm, normal heart sounds and intact distal pulses.  Exam reveals no gallop and no friction rub.   No murmur  heard. Pulmonary/Chest: Effort normal and breath sounds normal. No stridor. No respiratory distress. He has no wheezes. He has no rales. He exhibits no tenderness.  Abdominal: Soft. Bowel sounds are normal. He exhibits no distension and no mass. There is no tenderness. There is no rebound and no guarding.  Genitourinary: Rectum normal, prostate normal and penis normal. Guaiac negative stool. No penile tenderness.  Musculoskeletal: Normal range of motion. He exhibits no edema and no tenderness.       Flatfeet  Lymphadenopathy:    He has no cervical adenopathy.  Neurological: He is alert and oriented to person, place, and time. He has normal reflexes. No cranial nerve deficit. He exhibits normal muscle tone.  Skin: Skin is warm and dry. No rash noted. No erythema. No pallor.  Psychiatric: He has a normal mood and affect. His behavior is normal. Judgment and thought content normal.          Assessment & Plan:  Healthy male  Obesity with history of elevated blood sugar most recent blood sugar is 85  Hypertension at goal at home BPs continue current medication  Osteoarthritis Motrin 400 mg twice daily  History of hypokalemia potassium 20 mEq twice a day  Erectile dysfunction continue Viagra when necessary  Allergic rhinitis continue Allegra and Flonase  Spinal stenosis Vicodin per spine specialist consider inversion table  Symptoms of BPH avoid caffeine

## 2012-11-13 ENCOUNTER — Other Ambulatory Visit: Payer: Self-pay | Admitting: Family Medicine

## 2012-12-03 ENCOUNTER — Ambulatory Visit (INDEPENDENT_AMBULATORY_CARE_PROVIDER_SITE_OTHER): Payer: BC Managed Care – PPO | Admitting: Family Medicine

## 2012-12-03 VITALS — BP 154/89 | HR 73 | Temp 98.2°F | Resp 18 | Ht 71.0 in | Wt 265.0 lb

## 2012-12-03 DIAGNOSIS — J310 Chronic rhinitis: Secondary | ICD-10-CM

## 2012-12-03 DIAGNOSIS — J329 Chronic sinusitis, unspecified: Secondary | ICD-10-CM

## 2012-12-03 MED ORDER — CEFDINIR 300 MG PO CAPS: 300.0000 mg | ORAL_CAPSULE | Freq: Two times a day (BID) | ORAL | Status: DC

## 2012-12-03 NOTE — Patient Instructions (Signed)
Plenty of fluids  Get plenty of rest.  Use the Flonase for at least one month, then continue that if it is helping a lot.  Take the Omnicef (Cefdinir) one twice daily for antibiotic  Return if worse

## 2012-12-03 NOTE — Progress Notes (Signed)
Subjective: 62 year old man who was here about 2 months ago with a sinusitis infection. He was treated with amoxicillin and improved, then his symptoms gradually returned. He's been blowing a lot of yellow jumped out of his nose. He has right sided discomfort behind his right eye. He does not smoke. He does work at [employer], which can be careful of tiny debris.  Objective: Alert pleasant gentleman in no major distress. TMs normal. Eyes PERRLA. Throat clear. Neck supple without nodes thyromegaly. Chest is clear to auscultation. Heart regular without murmurs.  Assessment: Sinusitis/purulent rhinitis  Plan: Change antibiotics to Omnicef twice a day for a three-week course.  Continue using his Flonase  Return if worse.

## 2012-12-08 ENCOUNTER — Other Ambulatory Visit: Payer: Self-pay | Admitting: Family Medicine

## 2013-01-05 ENCOUNTER — Ambulatory Visit: Payer: BC Managed Care – PPO | Admitting: Family Medicine

## 2013-01-09 ENCOUNTER — Other Ambulatory Visit: Payer: Self-pay | Admitting: *Deleted

## 2013-01-12 ENCOUNTER — Encounter: Payer: Self-pay | Admitting: Family Medicine

## 2013-01-12 ENCOUNTER — Ambulatory Visit (INDEPENDENT_AMBULATORY_CARE_PROVIDER_SITE_OTHER): Payer: BC Managed Care – PPO | Admitting: Family Medicine

## 2013-01-12 ENCOUNTER — Encounter: Payer: Self-pay | Admitting: *Deleted

## 2013-01-12 VITALS — BP 130/88 | Temp 99.0°F | Wt 272.0 lb

## 2013-01-12 DIAGNOSIS — D509 Iron deficiency anemia, unspecified: Secondary | ICD-10-CM

## 2013-01-12 DIAGNOSIS — E669 Obesity, unspecified: Secondary | ICD-10-CM

## 2013-01-12 DIAGNOSIS — R7309 Other abnormal glucose: Secondary | ICD-10-CM

## 2013-01-12 DIAGNOSIS — I10 Essential (primary) hypertension: Secondary | ICD-10-CM

## 2013-01-12 LAB — CBC WITH DIFFERENTIAL/PLATELET
Basophils Absolute: 0 10*3/uL (ref 0.0–0.1)
Basophils Relative: 0.2 % (ref 0.0–3.0)
Eosinophils Absolute: 0.1 10*3/uL (ref 0.0–0.7)
Eosinophils Relative: 2.5 % (ref 0.0–5.0)
HCT: 40.3 % (ref 39.0–52.0)
Hemoglobin: 13.4 g/dL (ref 13.0–17.0)
Lymphocytes Relative: 22.1 % (ref 12.0–46.0)
Lymphs Abs: 1.1 10*3/uL (ref 0.7–4.0)
MCHC: 33.1 g/dL (ref 30.0–36.0)
MCV: 93.9 fl (ref 78.0–100.0)
Monocytes Absolute: 0.7 10*3/uL (ref 0.1–1.0)
Monocytes Relative: 14.5 % — ABNORMAL HIGH (ref 3.0–12.0)
Neutro Abs: 3 10*3/uL (ref 1.4–7.7)
Neutrophils Relative %: 60.7 % (ref 43.0–77.0)
Platelets: 293 10*3/uL (ref 150.0–400.0)
RBC: 4.3 Mil/uL (ref 4.22–5.81)
RDW: 15 % — ABNORMAL HIGH (ref 11.5–14.6)
WBC: 5 10*3/uL (ref 4.5–10.5)

## 2013-01-12 LAB — BASIC METABOLIC PANEL
BUN: 13 mg/dL (ref 6–23)
CO2: 26 mEq/L (ref 19–32)
Calcium: 9.5 mg/dL (ref 8.4–10.5)
Chloride: 106 mEq/L (ref 96–112)
Creatinine, Ser: 1.1 mg/dL (ref 0.4–1.5)
GFR: 84.99 mL/min (ref >60.00)
Glucose, Bld: 110 mg/dL — ABNORMAL HIGH (ref 70–99)
Potassium: 4 mEq/L (ref 3.5–5.1)
Sodium: 141 mEq/L (ref 135–145)

## 2013-01-12 NOTE — Progress Notes (Signed)
  Subjective:    Patient ID: Keith [name], male    DOB: 03-Jun-1951, 62 y.o.   MRN: [id number]  HPI Keith [name] is a 62year-old male married nonsmoker who comes in today for evaluation of multiple issues  He's currently on Tenoretic 50-25 daily for hypertension BP 130/80  His weight is up to 272. He finds it difficult to stay with the diet. He has started a walking program. He does have underlying spinal stenosis. We discussed various options he did agree for he and his wife to go to the nutrition clinic to discuss diet  He's also taking a potassium supplement 40 mg daily because of hypokalemia from the Tenoretic.  He had a mild low-grade anemia we stopped the Motrin and he states that iron tablet daily at bedtime now for 2 months. General review of systems otherwise negative   Review of Systems General review of systems otherwise negative    Objective:   Physical Exam  Well-developed well-nourished male no acute distress BP right arm sitting position 130/88      Assessment & Plan:  Hypertension at goal continue current therapy  Hypokalemia continue potassium supplement check potassium level  Recheck hemoglobin  Obesity referred to the nutrition clinic for diet and exercise consult

## 2013-01-12 NOTE — Patient Instructions (Addendum)
Continue your current medications  I will call you I gets her lab work back and we will discuss followup medications  Stop the iron  Continue the potassium 2 tabs twice daily  We will get you set up for a nutrition consult to address the concerns of weight  Followup in 3 months sooner if any problems

## 2013-01-15 ENCOUNTER — Other Ambulatory Visit: Payer: Self-pay | Admitting: *Deleted

## 2013-01-26 ENCOUNTER — Encounter (HOSPITAL_BASED_OUTPATIENT_CLINIC_OR_DEPARTMENT_OTHER): Payer: Self-pay | Admitting: *Deleted

## 2013-02-02 ENCOUNTER — Ambulatory Visit: Payer: BC Managed Care – PPO | Admitting: *Deleted

## 2013-02-06 ENCOUNTER — Encounter: Payer: Self-pay | Admitting: *Deleted

## 2013-02-06 ENCOUNTER — Encounter: Payer: BC Managed Care – PPO | Attending: Family Medicine | Admitting: *Deleted

## 2013-02-06 VITALS — Ht 72.0 in | Wt 269.8 lb

## 2013-02-06 DIAGNOSIS — Z713 Dietary counseling and surveillance: Secondary | ICD-10-CM | POA: Insufficient documentation

## 2013-02-06 DIAGNOSIS — E669 Obesity, unspecified: Secondary | ICD-10-CM

## 2013-02-06 NOTE — Progress Notes (Signed)
Medical Nutrition Therapy:  Appt start time: 1030 end time:  1130.  Assessment:  Primary concern today: Obesity. Patient reports that he is interested in slow, healthy weight loss. He reports that his weight fluctuates depending on what he is eating. He has lost about 2 pounds since his doctor visit 1 month ago. Due to back problems, he is limited in activity and wants weight loss so he can exercise more comfortably. 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 He does eat out from vending machines frequently. Since last month, he has limited fried foods and bread.   WEIGHT: 269.8 pounds  MEDICATIONS: Atenolol-chlorthalidone, clonidine, multivitamin   DIETARY INTAKE:   Usual eating pattern includes 3 meals and 2 snacks per day.  24-hr recall:  B (10:30 AM): Breakfast burrito or biscuit, water Snk (5:30 PM): slice of pizza, vending machine (Honey buns, payday bar, cheese crackers)  L (8 PM): Burger with fries, frozen dinner (mac and cheese), meat, starch, vegetable, water Snk ( PM): Same D (11-12 PM): Eggs, bacon OR cereal OR salad Snk ( PM): None Beverages: Water  Usual physical activity: Walking infrequently  Estimated energy needs: 2000 calories 250 g carbohydrates 125 g protein 56 g fat  Progress Towards Goal(s):  In progress.   Nutritional Diagnosis:  [place]-3.3 Overweight/obesity As related to physical inactivity and excessive energy intake.  As evidenced by BMI 36.7.    Intervention:  Nutrition counseling. We discussed strategies for weight loss, including eating regular meals, balancing nutrients (carbs, protein, fat), portion control, healthy snacks, healthy food choices, and exercise.   Goals:  1. 1 pound weight loss per week. Goal weight 260 pounds by next visit.  2. Eat 3 balanced meals daily. Incorporate 2 healthy snacks between meals (fruit, celery, cheese, nuts). Bring to work to [employer] use.  3. Work up to 2 servings of fruit and  at least 3 servings of vegetables daily.   Handouts given during visit include:  TLC diet handout  Weight loss tips handout  Monitoring/Evaluation:  Dietary intake, exercise, and body weight in 2 month(s).

## 2013-04-10 ENCOUNTER — Inpatient Hospital Stay (HOSPITAL_COMMUNITY)
Admission: EM | Admit: 2013-04-10 | Discharge: 2013-04-12 | DRG: 569 | Disposition: A | Discharge status: Home / Self Care | Payer: BC Managed Care – PPO | Attending: Internal Medicine | Admitting: Internal Medicine

## 2013-04-10 ENCOUNTER — Encounter (HOSPITAL_COMMUNITY): Payer: Self-pay | Admitting: Emergency Medicine

## 2013-04-10 DIAGNOSIS — R5381 Other malaise: Secondary | ICD-10-CM | POA: Diagnosis present

## 2013-04-10 DIAGNOSIS — E860 Dehydration: Secondary | ICD-10-CM | POA: Diagnosis present

## 2013-04-10 DIAGNOSIS — E669 Obesity, unspecified: Secondary | ICD-10-CM

## 2013-04-10 DIAGNOSIS — E876 Hypokalemia: Secondary | ICD-10-CM | POA: Diagnosis present

## 2013-04-10 DIAGNOSIS — M48061 Spinal stenosis, lumbar region without neurogenic claudication: Secondary | ICD-10-CM

## 2013-04-10 DIAGNOSIS — N179 Acute kidney failure, unspecified: Secondary | ICD-10-CM | POA: Diagnosis present

## 2013-04-10 DIAGNOSIS — N390 Urinary tract infection, site not specified: Principal | ICD-10-CM | POA: Diagnosis present

## 2013-04-10 DIAGNOSIS — R7309 Other abnormal glucose: Secondary | ICD-10-CM

## 2013-04-10 DIAGNOSIS — D75839 Thrombocytosis, unspecified: Secondary | ICD-10-CM

## 2013-04-10 DIAGNOSIS — M1990 Unspecified osteoarthritis, unspecified site: Secondary | ICD-10-CM

## 2013-04-10 DIAGNOSIS — J309 Allergic rhinitis, unspecified: Secondary | ICD-10-CM | POA: Diagnosis present

## 2013-04-10 DIAGNOSIS — D649 Anemia, unspecified: Secondary | ICD-10-CM | POA: Diagnosis present

## 2013-04-10 DIAGNOSIS — R7401 Elevation of levels of liver transaminase levels: Secondary | ICD-10-CM

## 2013-04-10 DIAGNOSIS — N183 Chronic kidney disease, stage 3 unspecified: Secondary | ICD-10-CM | POA: Diagnosis present

## 2013-04-10 DIAGNOSIS — J329 Chronic sinusitis, unspecified: Secondary | ICD-10-CM | POA: Diagnosis present

## 2013-04-10 DIAGNOSIS — D72829 Elevated white blood cell count, unspecified: Secondary | ICD-10-CM

## 2013-04-10 DIAGNOSIS — F528 Other sexual dysfunction not due to a substance or known physiological condition: Secondary | ICD-10-CM

## 2013-04-10 DIAGNOSIS — Z87891 Personal history of nicotine dependence: Secondary | ICD-10-CM

## 2013-04-10 DIAGNOSIS — J301 Allergic rhinitis due to pollen: Secondary | ICD-10-CM

## 2013-04-10 DIAGNOSIS — I10 Essential (primary) hypertension: Secondary | ICD-10-CM | POA: Diagnosis present

## 2013-04-10 DIAGNOSIS — N12 Tubulo-interstitial nephritis, not specified as acute or chronic: Secondary | ICD-10-CM

## 2013-04-10 DIAGNOSIS — R5383 Other fatigue: Secondary | ICD-10-CM | POA: Diagnosis present

## 2013-04-10 DIAGNOSIS — R310 Gross hematuria: Secondary | ICD-10-CM | POA: Diagnosis present

## 2013-04-10 DIAGNOSIS — D473 Essential (hemorrhagic) thrombocythemia: Secondary | ICD-10-CM

## 2013-04-10 DIAGNOSIS — I440 Atrioventricular block, first degree: Secondary | ICD-10-CM

## 2013-04-10 HISTORY — DX: Urinary tract infection, site not specified: N39.0

## 2013-04-10 HISTORY — DX: Hypokalemia: E87.6

## 2013-04-10 LAB — RETICULOCYTES
RBC.: 3.27 MIL/uL — ABNORMAL LOW (ref 4.22–5.81)
Retic Count, Absolute: 36 10*3/uL (ref 19.0–186.0)
Retic Ct Pct: 1.1 % (ref 0.4–3.1)

## 2013-04-10 LAB — URINALYSIS, ROUTINE W REFLEX MICROSCOPIC
Bilirubin Urine: NEGATIVE
Glucose, UA: NEGATIVE mg/dL
Ketones, ur: NEGATIVE mg/dL
Nitrite: NEGATIVE
Protein, ur: 100 mg/dL — AB
Specific Gravity, Urine: 1.022 (ref 1.005–1.030)
Urobilinogen, UA: 0.2 mg/dL (ref 0.0–1.0)
pH: 6 (ref 5.0–8.0)

## 2013-04-10 LAB — COMPREHENSIVE METABOLIC PANEL
ALT: 131 U/L — ABNORMAL HIGH (ref 0–53)
AST: 81 U/L — ABNORMAL HIGH (ref 0–37)
Albumin: 2.9 g/dL — ABNORMAL LOW (ref 3.5–5.2)
Alkaline Phosphatase: 170 U/L — ABNORMAL HIGH (ref 39–117)
BUN: 26 mg/dL — ABNORMAL HIGH (ref 6–23)
CO2: 30 mEq/L (ref 19–32)
Calcium: 9.5 mg/dL (ref 8.4–10.5)
Chloride: 95 mEq/L — ABNORMAL LOW (ref 96–112)
Creatinine, Ser: 1.81 mg/dL — ABNORMAL HIGH (ref 0.50–1.35)
GFR calc Af Amer: 45 mL/min — ABNORMAL LOW (ref >90)
GFR calc non Af Amer: 39 mL/min — ABNORMAL LOW (ref >90)
Glucose, Bld: 116 mg/dL — ABNORMAL HIGH (ref 70–99)
Potassium: 2.6 mEq/L — CL (ref 3.5–5.1)
Sodium: 136 mEq/L (ref 135–145)
Total Bilirubin: 0.2 mg/dL — ABNORMAL LOW (ref 0.3–1.2)
Total Protein: 8.6 g/dL — ABNORMAL HIGH (ref 6.0–8.3)

## 2013-04-10 LAB — CBC
HCT: 30.8 % — ABNORMAL LOW (ref 39.0–52.0)
HCT: 29.4 % — ABNORMAL LOW (ref 39.0–52.0)
Hemoglobin: 10.5 g/dL — ABNORMAL LOW (ref 13.0–17.0)
Hemoglobin: 9.8 g/dL — ABNORMAL LOW (ref 13.0–17.0)
MCH: 30.5 pg (ref 26.0–34.0)
MCH: 30 pg (ref 26.0–34.0)
MCHC: 34.1 g/dL (ref 30.0–36.0)
MCHC: 33.3 g/dL (ref 30.0–36.0)
MCV: 89.5 fL (ref 78.0–100.0)
MCV: 89.9 fL (ref 78.0–100.0)
Platelets: 493 10*3/uL — ABNORMAL HIGH (ref 150–400)
Platelets: 513 10*3/uL — ABNORMAL HIGH (ref 150–400)
RBC: 3.44 MIL/uL — ABNORMAL LOW (ref 4.22–5.81)
RBC: 3.27 MIL/uL — ABNORMAL LOW (ref 4.22–5.81)
RDW: 14.7 % (ref 11.5–15.5)
RDW: 14.8 % (ref 11.5–15.5)
WBC: 11.2 10*3/uL — ABNORMAL HIGH (ref 4.0–10.5)
WBC: 11.1 10*3/uL — ABNORMAL HIGH (ref 4.0–10.5)

## 2013-04-10 LAB — CREATININE, SERUM
Creatinine, Ser: 1.69 mg/dL — ABNORMAL HIGH (ref 0.50–1.35)
GFR calc Af Amer: 49 mL/min — ABNORMAL LOW (ref >90)
GFR calc non Af Amer: 42 mL/min — ABNORMAL LOW (ref >90)

## 2013-04-10 LAB — URINE MICROSCOPIC-ADD ON

## 2013-04-10 LAB — LIPASE, BLOOD: Lipase: 28 U/L (ref 11–59)

## 2013-04-10 LAB — MAGNESIUM: Magnesium: 2.9 mg/dL — ABNORMAL HIGH (ref 1.5–2.5)

## 2013-04-10 LAB — POTASSIUM: Potassium: 2.7 mEq/L — CL (ref 3.5–5.1)

## 2013-04-10 MED ORDER — ACETAMINOPHEN 325 MG PO TABS: 650.0000 mg | ORAL_TABLET | Freq: Four times a day (QID) | ORAL | Status: DC | PRN

## 2013-04-10 MED ORDER — FLUTICASONE PROPIONATE 50 MCG/ACT NA SUSP
2.0000 | Freq: Every day | NASAL | Status: DC
  Administered 2013-04-10 – 2013-04-11 (×2): 2 via NASAL
  Filled 2013-04-10: qty 16

## 2013-04-10 MED ORDER — POTASSIUM CHLORIDE 10 MEQ/100ML IV SOLN
10.0000 meq | INTRAVENOUS | Status: AC
  Administered 2013-04-10 – 2013-04-11 (×4): 10 meq via INTRAVENOUS
  Filled 2013-04-10 (×4): qty 100

## 2013-04-10 MED ORDER — ONDANSETRON HCL 4 MG/2ML IJ SOLN
4.0000 mg | Freq: Once | INTRAMUSCULAR | Status: AC
  Administered 2013-04-10: 4 mg via INTRAVENOUS
  Filled 2013-04-10: qty 2

## 2013-04-10 MED ORDER — DEXTROSE 5 % IV SOLN
1.0000 g | Freq: Once | INTRAVENOUS | Status: AC
  Administered 2013-04-10: 17:00:00 via INTRAVENOUS
  Filled 2013-04-10: qty 10

## 2013-04-10 MED ORDER — POTASSIUM CHLORIDE CRYS ER 20 MEQ PO TBCR
40.0000 meq | EXTENDED_RELEASE_TABLET | Freq: Once | ORAL | Status: AC
  Administered 2013-04-10: 40 meq via ORAL
  Filled 2013-04-10: qty 2

## 2013-04-10 MED ORDER — POTASSIUM CHLORIDE 10 MEQ/100ML IV SOLN
10.0000 meq | Freq: Once | INTRAVENOUS | Status: DC
  Administered 2013-04-10: 10 meq via INTRAVENOUS
  Filled 2013-04-10: qty 100

## 2013-04-10 MED ORDER — ONDANSETRON HCL 4 MG/2ML IJ SOLN: 4.0000 mg | Freq: Four times a day (QID) | INTRAMUSCULAR | Status: DC | PRN

## 2013-04-10 MED ORDER — SODIUM CHLORIDE 0.9 % IJ SOLN
3.0000 mL | Freq: Two times a day (BID) | INTRAMUSCULAR | Status: DC
  Administered 2013-04-10 – 2013-04-11 (×2): 3 mL via INTRAVENOUS

## 2013-04-10 MED ORDER — DEXTROSE 5 % IV SOLN
1.0000 g | INTRAVENOUS | Status: DC
  Administered 2013-04-11 – 2013-04-12 (×2): 1 g via INTRAVENOUS
  Filled 2013-04-10 (×2): qty 10

## 2013-04-10 MED ORDER — ONDANSETRON HCL 4 MG PO TABS: 4.0000 mg | ORAL_TABLET | Freq: Four times a day (QID) | ORAL | Status: DC | PRN

## 2013-04-10 MED ORDER — CLONIDINE HCL 0.1 MG PO TABS
0.1000 mg | ORAL_TABLET | Freq: Every morning | ORAL | Status: DC
  Administered 2013-04-11 – 2013-04-12 (×2): 0.1 mg via ORAL
  Filled 2013-04-10 (×2): qty 1

## 2013-04-10 MED ORDER — ATENOLOL 50 MG PO TABS
50.0000 mg | ORAL_TABLET | Freq: Every day | ORAL | Status: DC
  Administered 2013-04-11 – 2013-04-12 (×2): 50 mg via ORAL
  Filled 2013-04-10 (×2): qty 1

## 2013-04-10 MED ORDER — ACETAMINOPHEN 650 MG RE SUPP: 650.0000 mg | Freq: Four times a day (QID) | RECTAL | Status: DC | PRN

## 2013-04-10 MED ORDER — SODIUM CHLORIDE 0.9 % IV SOLN: INTRAVENOUS | Status: DC

## 2013-04-10 MED ORDER — LORATADINE 10 MG PO TABS
10.0000 mg | ORAL_TABLET | Freq: Every day | ORAL | Status: DC
  Administered 2013-04-10 – 2013-04-12 (×3): 10 mg via ORAL
  Filled 2013-04-10 (×3): qty 1

## 2013-04-10 MED ORDER — ASPIRIN 325 MG PO TABS
325.0000 mg | ORAL_TABLET | Freq: Two times a day (BID) | ORAL | Status: DC
  Administered 2013-04-10 – 2013-04-12 (×4): 325 mg via ORAL
  Filled 2013-04-10 (×5): qty 1

## 2013-04-10 MED ORDER — SODIUM CHLORIDE 0.9 % IV BOLUS (SEPSIS)
1000.0000 mL | Freq: Once | INTRAVENOUS | Status: AC
  Administered 2013-04-10: 1000 mL via INTRAVENOUS

## 2013-04-10 MED ORDER — HEPARIN SODIUM (PORCINE) 5000 UNIT/ML IJ SOLN
5000.0000 [IU] | Freq: Three times a day (TID) | INTRAMUSCULAR | Status: DC
  Administered 2013-04-10 – 2013-04-12 (×5): 5000 [IU] via SUBCUTANEOUS
  Filled 2013-04-10 (×8): qty 1

## 2013-04-10 MED ORDER — MORPHINE SULFATE 2 MG/ML IJ SOLN: 2.0000 mg | INTRAMUSCULAR | Status: DC | PRN

## 2013-04-10 MED ORDER — OXYCODONE HCL 5 MG PO TABS: 5.0000 mg | ORAL_TABLET | ORAL | Status: DC | PRN

## 2013-04-10 MED ORDER — SODIUM CHLORIDE 0.9 % IV SOLN
INTRAVENOUS | Status: DC
  Administered 2013-04-10 – 2013-04-12 (×4): via INTRAVENOUS

## 2013-04-10 MED ORDER — ADULT MULTIVITAMIN W/MINERALS CH
1.0000 | ORAL_TABLET | Freq: Every day | ORAL | Status: DC
  Administered 2013-04-10 – 2013-04-12 (×3): 1 via ORAL
  Filled 2013-04-10 (×3): qty 1

## 2013-04-10 NOTE — ED Provider Notes (Signed)
Medical screening examination/treatment/procedure(s) were performed by non-physician practitioner and as supervising physician I was immediately available for consultation/collaboration.  Ethelda Chick, MD 04/10/13 [phone]

## 2013-04-10 NOTE — H&P (Signed)
PCP:   Evette Georges, MD   Chief Complaint:  Unwell, poor appetite, blood in urine.   HPI: This is a 62 year old male, with known history of HTN, Obesity, OA, DJD/Lumbar stenosis, impaired glucose tolerance and seasonal allergies. Patient had an E. Coli UTI/Pyelonephritis diagnosed during an ED visit 01/19/12, treated with Ciprofloxacin. According to patient, since 10/2012, he has been troubled by sinus problems/infection, and was treated  With antibiotics (Omnicef/Amoxicillin) by PMD in 12/2012. He still continues to have nasal congestion and post nasal drip, with production of purulent phlegm, but denies facial pain or headache. Since 04/03/13, he has felt nauseated, without vomiting or abdominal pain, appetite has been poor, and he has had constipation, although relieved with laxatives. Last bowel movement was in AM of 04/10/13, after taking a laxative in the night of 04/09/13. Patient has experienced some difficulty urinating, noticed gross hematuria on 04/04/13-04/05/13. He has had temperatures of about 99 F, frequent chills and sweats. Because of progressive weakness, spouse brought him to the ED.    Allergies:  No Known Allergies    Past Medical History  Diagnosis Date  . Hypertension   . Obesity   . ED (erectile dysfunction)   . Glucose intolerance (impaired glucose tolerance)   . Osteoarthritis   . Allergy   . Spinal stenosis of lumbar region     Past Surgical History  Procedure Laterality Date  . Tonsillectomy      Prior to Admission medications   Medication Sig Start Date End Date Taking? Authorizing Provider  aspirin 325 MG tablet Take 325 mg by mouth 2 (two) times daily.   Yes Historical Provider, MD  atenolol-chlorthalidone (TENORETIC) 50-25 MG per tablet Take 1 tablet by mouth daily.   Yes Historical Provider, MD  cloNIDine (CATAPRES) 0.1 MG tablet Take 0.1 mg by mouth every morning.   Yes Historical Provider, MD  fluticasone (FLONASE) 50 MCG/ACT nasal spray Place 2  sprays into the nose at bedtime.   Yes Historical Provider, MD  HYDROcodone-acetaminophen (NORCO) 10-325 MG per tablet Take 1 tablet by mouth every 6 (six) hours as needed for pain.    Yes Historical Provider, MD  ibuprofen (ADVIL,MOTRIN) 200 MG tablet Take 400 mg by mouth every 8 (eight) hours as needed for pain.   Yes Historical Provider, MD  KLOR-CON M10 10 MEQ tablet Take 20 mEq by mouth 2 (two) times daily.  11/06/12  Yes Historical Provider, MD  Multiple Vitamin (MULITIVITAMIN WITH MINERALS) TABS Take 1 tablet by mouth daily.   Yes Historical Provider, MD    Social History: Patient reports that he has quit smoking. He has never used smokeless tobacco. He reports that he does not drink alcohol or use illicit drugs.  Family History  Problem Relation Age of Onset  . Diabetes Mother   . Asthma Mother   . Hypertension Father   . Stroke Father     Review of Systems:  As per HPI and chief complaint. Patent has fatigue, diminished appetite, chills, but no headache, blurred vision, difficulty in speaking, dysphagia, chest pain, cough, shortness of breath, orthopnea, paroxysmal nocturnal dyspnea, abdominal pain, vomiting, diarrhea, belching, heartburn, hematemesis, melena, hematochezia, lower extremity swelling, pain, or redness. The rest of the systems review is negative.  Physical Exam:  General:  Patient does not appear to be in obvious acute distress. Alert, communicative, fully oriented, talking in complete sentences, not short of breath at rest.  HEENT:  Mild clinical pallor, no jaundice, no conjunctival injection or discharge.  Buccal mucosa appears "dry". Nasal turbinates are swollen, hyperemic, and right nostril ids clogged with purulent mucus. There is no paranasal sinus tenderness.  NECK:  Supple, JVP not seen, no carotid bruits, no palpable lymphadenopathy, no palpable goiter. CHEST:  Clinically clear to auscultation, no wheezes, no crackles. HEART:  Sounds 1 and 2 heard, normal,  regular, no murmurs. ABDOMEN:  Moderately obese, soft, non-tender, no palpable organomegaly, no palpable masses, normal bowel sounds. GENITALIA:  Not examined. LOWER EXTREMITIES:  No pitting edema, palpable peripheral pulses. MUSCULOSKELETAL SYSTEM:  Unremarkable. CENTRAL NERVOUS SYSTEM:  No focal neurologic deficit on gross examination.  Labs on Admission:  Results for orders placed during the hospital encounter of 04/10/13 (from the past 48 hour(s))  CBC     Status: Abnormal   Collection Time    04/10/13  3:45 PM      Result Value Range   WBC 11.2 (*) 4.0 - 10.5 K/uL   RBC 3.44 (*) 4.22 - 5.81 MIL/uL   Hemoglobin 10.5 (*) 13.0 - 17.0 g/dL   HCT 16.1 (*) 09.6 - 04.5 %   MCV 89.5  78.0 - 100.0 fL   MCH 30.5  26.0 - 34.0 pg   MCHC 34.1  30.0 - 36.0 g/dL   RDW 40.9  81.1 - 91.4 %   Platelets 493 (*) 150 - 400 K/uL  COMPREHENSIVE METABOLIC PANEL     Status: Abnormal   Collection Time    04/10/13  3:45 PM      Result Value Range   Sodium 136  135 - 145 mEq/L   Potassium 2.6 (*) 3.5 - 5.1 mEq/L   Comment: CRITICAL RESULT CALLED TO, READ BACK BY AND VERIFIED WITH:     COE,S. RN AT 1426 04/10/13 BY WELLS,D.   Chloride 95 (*) 96 - 112 mEq/L   CO2 30  19 - 32 mEq/L   Glucose, Bld 116 (*) 70 - 99 mg/dL   BUN 26 (*) 6 - 23 mg/dL   Creatinine, Ser 7.82 (*) 0.50 - 1.35 mg/dL   Calcium 9.5  8.4 - 95.6 mg/dL   Total Protein 8.6 (*) 6.0 - 8.3 g/dL   Albumin 2.9 (*) 3.5 - 5.2 g/dL   AST 81 (*) 0 - 37 U/L   ALT 131 (*) 0 - 53 U/L   Alkaline Phosphatase 170 (*) 39 - 117 U/L   Total Bilirubin 0.2 (*) 0.3 - 1.2 mg/dL   GFR calc non Af Amer 39 (*) >90 mL/min   GFR calc Af Amer 45 (*) >90 mL/min   Comment:            The eGFR has been calculated     using the CKD EPI equation.     This calculation has not been     validated in all clinical     situations.     eGFR's persistently     <90 mL/min signify     possible Chronic Kidney Disease.  LIPASE, BLOOD     Status: None   Collection Time     04/10/13  3:45 PM      Result Value Range   Lipase 28  11 - 59 U/L  URINALYSIS, ROUTINE W REFLEX MICROSCOPIC     Status: Abnormal   Collection Time    04/10/13  3:55 PM      Result Value Range   Color, Urine YELLOW  YELLOW   APPearance CLOUDY (*) CLEAR   Specific Gravity, Urine 1.022  1.005 - 1.030  pH 6.0  5.0 - 8.0   Glucose, UA NEGATIVE  NEGATIVE mg/dL   Hgb urine dipstick LARGE (*) NEGATIVE   Bilirubin Urine NEGATIVE  NEGATIVE   Ketones, ur NEGATIVE  NEGATIVE mg/dL   Protein, ur 409 (*) NEGATIVE mg/dL   Urobilinogen, UA 0.2  0.0 - 1.0 mg/dL   Nitrite NEGATIVE  NEGATIVE   Leukocytes, UA MODERATE (*) NEGATIVE  URINE MICROSCOPIC-ADD ON     Status: Abnormal   Collection Time    04/10/13  3:55 PM      Result Value Range   Squamous Epithelial / LPF RARE  RARE   WBC, UA TOO NUMEROUS TO COUNT  <3 WBC/hpf   RBC / HPF 7-10  <3 RBC/hpf   Bacteria, UA MANY (*) RARE    Radiological Exams on Admission: No results found.  Assessment/Plan Active Problems:    1. UTI (lower urinary tract infection): Patient presented with progressive weakness, chills, poor appetite and nausea. No significant pyrexia, and wcc is borderline at 11.2. U/A revealed pyuria and bacteriuria, consistent with UTI. Commenced on iv Rocephin in ED. I agree with this choice, and will continue. Urine culture is pending. 2. Dehydration/ARF (acute renal failure): Creatinine on presentation, was 1.81, BUN 26 (Baseline creatinine 1.1 on 01/12/13), consistent with ARF, likely pre-renal, secondary to poor oral intake, dehydration, volume depletion, in the setting of continuing diuretic therapy and NSAIDS. Will discontinue diuretic and NSAID, avoid all nephrotoxins, and manage with iv fluids. Will follow renal indices.  3. Hypokalemia: This is profound, at 2.6. Patient has no concerning EKG changes, and the culprit is likely his diuretic therapy. Repleting as indicated.  4. Allergic rhinitis/Sinusitis: Patient has known  seasonal allergies, and is currently symptomatic, with nasal congestion. In addition, he has been troubled by recurrent sinus infections since the beginning of the year, despite 2 courses of antibiotics in 12/2012. Physical exam, revealed a purulent nasal discharge. Current antibiotic therapy should suffice, but we shall address with Flonase nasal spray in addition, Claritin, and feel that patient will benefit from ENT referral, on discharge.  We have indicated as much, to patient.  5. Anemia: This is mild, normocytic. Will check anemia panel and TSH.  6. HTN (hypertension): Currently controlled. As described above, will discontinue diuretic, but will continue all other pre-admission antihypertensives, as indicated.   Further management will depend on clinical course.   Comment: Patient is FULL CODE.   Time Spent on Admission: 1 hour.  Waunita Sandstrom,CHRISTOPHER 04/10/2013, 5:56 PM

## 2013-04-10 NOTE — ED Notes (Signed)
CRITICAL VALUE ALERT  Critical value received: K+ 2.6  Date of notification:  7.4.14  Time of notification:  16:27  Critical value read back:yes  Nurse who received alert:  Alfonzo Feller, RN  MD notified (1st page):  Arthor Captain, PA  Time of first page: 1630  MD notified (2nd page):NA  Time of second page:NA  Responding MD: NA   Time MD responded: NA

## 2013-04-10 NOTE — ED Notes (Signed)
Report given to [address].

## 2013-04-10 NOTE — ED Notes (Addendum)
Pt reports that he has had a sinus infection since January that he is treating with abx. Pt went back to PCP and was given Omnicef, amoxicillin in March, but still has sinus infection. Pt reports nausea x 5days and been unable to eat but is drinking water. Pt denies vomiting, diarrhea but confirms constipation and difficulty urinating. Pt adds that he had hematuria last Saturday and Sunday. Pt adds that he has hx of abd bacterial infection. Pt denies SOB, CP, fever. Pt A&O and in NAD

## 2013-04-10 NOTE — ED Provider Notes (Signed)
History    CSN: [id number] Arrival date & time 04/10/13  1353  First MD Initiated Contact with Patient 04/10/13 1505     Chief Complaint  Patient presents with  . Nausea  . Weakness   (Consider location/radiation/quality/duration/timing/severity/associated sxs/prior Treatment) HPI Keith [name] is a 62 year old male with a past medical history of hypertension, obesity and osteoarthritis who presents the emergency department for evaluation of malaise and nausea.  Patient also complains of recurrent sinusitis.  Patient states that last Friday one week ago he began having general malaise.  He frequently gets interrupt history of care he had increasing nausea, decreased appetite, subjective fever, chills, malaise.  The patient states that symptoms are progressive and worsening.  He had 2 days of hematuria that resolved.  The patient has a past history of pyelonephritis and states that this is exactly the same as his previous symptoms.  He has associated frequency and urgency.  He states it is difficult to get history and started.  He denies any flank pain or suprapubic pain.  He denies any vomiting or diarrhea.  The patient states that he has had recurrent bacterial sinusitis with purulent and bloody nasal discharge.  He has been treated with amoxicillin and Ceftin ear without success.  States the dizziness he stopped his antibiotic his sinus symptoms return.  He denies any stiff neck, headache photophobia or rash.  The patient denies any ear pain, difficulty swallowing, cough or sore throat. Denies DOE, SOB, chest tightness or pressure, radiation to left arm, jaw or back, or diaphoresis    Past Medical History  Diagnosis Date  . Hypertension   . Obesity   . ED (erectile dysfunction)   . Glucose intolerance (impaired glucose tolerance)   . Osteoarthritis   . Allergy   . Spinal stenosis of lumbar region    Past Surgical History  Procedure Laterality Date  . Tonsillectomy     Family  History  Problem Relation Age of Onset  . Diabetes Mother   . Asthma Mother   . Hypertension Father   . Stroke Father    History  Substance Use Topics  . Smoking status: Former Smoker -- 1.00 packs/day for 25 years  . Smokeless tobacco: Never Used  . Alcohol Use: No    Review of Systems Ten systems reviewed and are negative for acute change, except as noted in the HPI.   Allergies  Review of patient's allergies indicates no known allergies.  Home Medications   Current Outpatient Rx  Name  Route  Sig  Dispense  Refill  . aspirin 325 MG tablet   Oral   Take 325 mg by mouth 2 (two) times daily.         Marland Kitchen atenolol-chlorthalidone (TENORETIC) 50-25 MG per tablet   Oral   Take 1 tablet by mouth daily.         . cloNIDine (CATAPRES) 0.1 MG tablet   Oral   Take 0.1 mg by mouth every morning.         . fluticasone (FLONASE) 50 MCG/ACT nasal spray   Nasal   Place 2 sprays into the nose at bedtime.         Marland Kitchen HYDROcodone-acetaminophen (NORCO) 10-325 MG per tablet   Oral   Take 1 tablet by mouth every 6 (six) hours as needed for pain.          Marland Kitchen ibuprofen (ADVIL,MOTRIN) 200 MG tablet   Oral   Take 400 mg by mouth every 8 (  eight) hours as needed for pain.         Marland Kitchen KLOR-CON M10 10 MEQ tablet   Oral   Take 20 mEq by mouth 2 (two) times daily.          . Multiple Vitamin (MULITIVITAMIN WITH MINERALS) TABS   Oral   Take 1 tablet by mouth daily.          BP 110/61  Pulse 63  Temp(Src) 99 F (37.2 C) (Oral)  Resp 16  SpO2 96% Physical Exam Physical Exam  Nursing note and vitals reviewed. Constitutional: He appears well-developed and well-nourished. No distress.  HENT:  Head: Normocephalic and atraumatic.  Eyes: Conjunctivae normal are normal. No scleral icterus.  Neck: Normal range of motion. Neck supple.  Cardiovascular: Normal rate, regular rhythm and normal heart sounds.   Pulmonary/Chest: Effort normal and breath sounds normal. No respiratory  distress.  Abdominal: Obese.  Soft. There is no tenderness.  no CVA tenderness  Musculoskeletal: He exhibits no edema.  Neurological: He is alert.  Skin: Skin is warm and dry. He is not diaphoretic.  Psychiatric: His behavior is normal.    ED Course  Procedures (including critical care time) Labs Reviewed  CBC - Abnormal; Notable for the following:    WBC 11.2 (*)    RBC 3.44 (*)    Hemoglobin 10.5 (*)    HCT 30.8 (*)    Platelets 493 (*)    All other components within normal limits  COMPREHENSIVE METABOLIC PANEL - Abnormal; Notable for the following:    Potassium 2.6 (*)    Chloride 95 (*)    Glucose, Bld 116 (*)    BUN 26 (*)    Creatinine, Ser 1.81 (*)    Total Protein 8.6 (*)    Albumin 2.9 (*)    AST 81 (*)    ALT 131 (*)    Alkaline Phosphatase 170 (*)    Total Bilirubin 0.2 (*)    GFR calc non Af Amer 39 (*)    GFR calc Af Amer 45 (*)    All other components within normal limits  URINALYSIS, ROUTINE W REFLEX MICROSCOPIC - Abnormal; Notable for the following:    APPearance CLOUDY (*)    Hgb urine dipstick LARGE (*)    Protein, ur 100 (*)    Leukocytes, UA MODERATE (*)    All other components within normal limits  URINE MICROSCOPIC-ADD ON - Abnormal; Notable for the following:    Bacteria, UA MANY (*)    All other components within normal limits  URINE CULTURE  LIPASE, BLOOD   No results found. 1. Pyelonephritis   2. Leukocytosis   3. Thrombocytosis   4. Transaminitis   5. Hypokalemia   6. 1St degree AV block      Date: 04/10/2013  Rate: 58  Rhythm: normal sinus rhythm  QRS Axis: normal  Intervals: normal  ST/T Wave abnormalities: normal  Conduction Disutrbances:first-degree A-V block   Narrative Interpretation:   Old EKG Reviewed: changes noted new 1st degree AV block    MDM  3:41 PM Filed Vitals:   04/10/13 1446  BP: 110/61  Pulse: 63  Temp: 99 F (37.2 C)  Resp: 82   62 year old male with past history of pyelonephritis.  Symptoms  seem consistent with urine infection.  The patient has not tried documented which is the preferred regimen for sinusitis.  Did discharge him with a prescription for that.  Patient has a followup appointment with his primary care  physician Dr. Tawanna Cooler next week.  The patient appears otherwise well.    4:50 PM 4:51 PM Filed Vitals:   04/10/13 1446  BP: 110/61  Pulse: 63  Temp: 99 F (37.2 C)  Resp: 16   Patient with urinary tract infection.  He has an elevated white count.  I suspect pyelonephritis.  Other findings include mild anemia, thrombocytosis, and notable hypokalemia and.  Patient's BUN and creatinine are elevated from previous visits.  Likely due to volume depletion.  Patient also has elevated liver enzymes.  This is not noted on any of his previous lab reports.  Patient is receiving 40 mEq delivered by mouth potassium, one run of IV potassium 10 mEq.  IV Rocephin.  I discussed the case with my attending physician Dr. Karma Ganja.  We agreed that due to patient's abnormal labs, pyelonephritis, elevated creatinine, transaminitis patient should be admitted to observation.  He he may also need inpatient abdominal ultrasound.   5:18 PM Patient admitted by Dr. Brien Few. Appears to have a new 1st Degree AV block.        Arthor Captain, PA-C 04/10/13 1740

## 2013-04-11 DIAGNOSIS — E876 Hypokalemia: Secondary | ICD-10-CM

## 2013-04-11 DIAGNOSIS — N390 Urinary tract infection, site not specified: Secondary | ICD-10-CM

## 2013-04-11 HISTORY — DX: Urinary tract infection, site not specified: N39.0

## 2013-04-11 HISTORY — DX: Hypokalemia: E87.6

## 2013-04-11 LAB — BASIC METABOLIC PANEL
BUN: 22 mg/dL (ref 6–23)
CO2: 30 mEq/L (ref 19–32)
Calcium: 9.1 mg/dL (ref 8.4–10.5)
Chloride: 99 mEq/L (ref 96–112)
Creatinine, Ser: 1.48 mg/dL — ABNORMAL HIGH (ref 0.50–1.35)
GFR calc Af Amer: 58 mL/min — ABNORMAL LOW (ref >90)
GFR calc non Af Amer: 50 mL/min — ABNORMAL LOW (ref >90)
Glucose, Bld: 116 mg/dL — ABNORMAL HIGH (ref 70–99)
Potassium: 3.1 mEq/L — ABNORMAL LOW (ref 3.5–5.1)
Sodium: 136 mEq/L (ref 135–145)

## 2013-04-11 LAB — CBC
HCT: 30.7 % — ABNORMAL LOW (ref 39.0–52.0)
Hemoglobin: 10.4 g/dL — ABNORMAL LOW (ref 13.0–17.0)
MCH: 30.3 pg (ref 26.0–34.0)
MCHC: 33.9 g/dL (ref 30.0–36.0)
MCV: 89.5 fL (ref 78.0–100.0)
Platelets: 499 10*3/uL — ABNORMAL HIGH (ref 150–400)
RBC: 3.43 MIL/uL — ABNORMAL LOW (ref 4.22–5.81)
RDW: 15 % (ref 11.5–15.5)
WBC: 10.5 10*3/uL (ref 4.0–10.5)

## 2013-04-11 LAB — MAGNESIUM: Magnesium: 2.6 mg/dL — ABNORMAL HIGH (ref 1.5–2.5)

## 2013-04-11 LAB — POTASSIUM: Potassium: 3 mEq/L — ABNORMAL LOW (ref 3.5–5.1)

## 2013-04-11 MED ORDER — POTASSIUM CHLORIDE CRYS ER 20 MEQ PO TBCR
40.0000 meq | EXTENDED_RELEASE_TABLET | Freq: Once | ORAL | Status: AC
  Administered 2013-04-11: 40 meq via ORAL
  Filled 2013-04-11: qty 2

## 2013-04-11 MED ORDER — HYDROCODONE-ACETAMINOPHEN 10-325 MG PO TABS
1.0000 | ORAL_TABLET | Freq: Four times a day (QID) | ORAL | Status: DC | PRN
  Administered 2013-04-11: 1 via ORAL
  Filled 2013-04-11: qty 1

## 2013-04-11 NOTE — Progress Notes (Signed)
[facility] PROGRESS NOTE  Keith [name] ZOX:[id number] DOB: [birth year] DOA: 04/10/2013 PCP: Evette Georges, MD  Assessment/Plan: Active Problems:   UTI (lower urinary tract infection)   Dehydration   ARF (acute renal failure)   Allergic rhinitis   Sinusitis   Hypokalemia   Anemia   HTN (hypertension)    1. UTI (lower urinary tract infection): Patient presented with progressive weakness, chills, poor appetite and nausea. No significant pyrexia, and wcc is borderline at 11.2. U/A revealed pyuria and bacteriuria, consistent with UTI. Managing with iv Rocephin, now day 3 2. Urine culture is pending. WCC has normalized at 10.5.  2. Dehydration/ARF (acute renal failure): Creatinine on presentation, was 1.81, BUN 26 (Baseline creatinine 1.1 on 01/12/13), consistent with ARF, likely pre-renal, secondary to poor oral intake, dehydration, volume depletion, in the setting of continuing diuretic therapy and NSAIDS. Have discontinued diuretic and NSAID, avoiding all nephrotoxins, managing with iv fluids and following renal indices. Creatinine has improved at 1.48. Further improvement is anticipated.  3. Hypokalemia: This is profound, at 2.6. Patient has no concerning EKG changes, and the culprit is likely his diuretic therapy. Repleting as indicated.  4. Allergic rhinitis/Sinusitis: Patient has known seasonal allergies, and is currently symptomatic, with nasal congestion. In addition, he has been troubled by recurrent sinus infections since the beginning of the year, despite 2 courses of antibiotics in 12/2012. Physical exam, revealed a purulent nasal discharge. Current antibiotic therapy should suffice. Addressing with Flonase nasal spray and Claritin in addition. Patient will benefit from ENT referral, on discharge.  5. Anemia: This is mild, normocytic. Anemia panel and TSH are pending.  6. HTN (hypertension): Currently controlled. As described above, have discontinued diuretic, but continued  all other pre-admission antihypertensives.    Code Status: Full Code.  Family Communication:  Disposition Plan: Tobe determined.   Brief narrative: 62 year old male, with known history of HTN, Obesity, OA, DJD/Lumbar stenosis, impaired glucose tolerance and seasonal allergies. Patient had an E. Coli UTI/Pyelonephritis diagnosed during an ED visit 01/19/12, treated with Ciprofloxacin. According to patient, since 10/2012, he has been troubled by sinus problems/infection, and was treated With antibiotics (Omnicef/Amoxicillin) by PMD in 12/2012. He still continues to have nasal congestion and post nasal drip, with production of purulent phlegm, but denies facial pain or headache. Since 04/03/13, he has felt nauseated, without vomiting or abdominal pain, appetite has been poor, and he has had constipation, although relieved with laxatives. Last bowel movement was in AM of 04/10/13, after taking a laxative in the night of 04/09/13. Patient has experienced some difficulty urinating, noticed gross hematuria on 04/04/13-04/05/13. He has had temperatures of about 99 F, frequent chills and sweats. Because of progressive weakness, spouse brought him to the ED.   Consultants:  N/A.   Procedures:  N/A.   Antibiotics:  Rocephin 04/10/13>>>  HPI/Subjective: Feels better. Appetite has improved. No new issues.   Objective: Vital signs in last 24 hours: Temp:  [97.5 F (36.4 C)-99 F (37.2 C)] 97.7 F (36.5 C) (07/05 0502) Pulse Rate:  [61-65] 65 (07/05 0502) Resp:  [16-26] 18 (07/05 0502) BP: (110-124)/(61-78) 124/69 mmHg (07/05 0502) SpO2:  [96 %-100 %] 100 % (07/05 0502) Weight:  [115.395 kg (254 lb 6.4 oz)] 115.395 kg (254 lb 6.4 oz) (07/04 1843) Weight change:  Last BM Date: 04/10/13  Intake/Output from previous day: 07/04 0701 - 07/05 0700 In: 1860 [P.O.:240; I.V.:1220; IV Piggyback:400] Out: 1050 [Urine:1050]     Physical Exam: General: Comfortable. Alert, communicative, fully oriented,  talking in complete sentences, not short of breath at rest.  HEENT: Mild clinical pallor, no jaundice, no conjunctival injection or discharge. Hydration is fair. Nasal turbinates are swollen, hyperemic. There is no paranasal sinus tenderness.  NECK: Supple, JVP not seen, no carotid bruits, no palpable lymphadenopathy, no palpable goiter.  CHEST: Clinically clear to auscultation, no wheezes, no crackles.  HEART: Sounds 1 and 2 heard, normal, regular, no murmurs.  ABDOMEN: Moderately obese, soft, non-tender, no palpable organomegaly, no palpable masses, normal bowel sounds.  GENITALIA: Not examined.  LOWER EXTREMITIES: No pitting edema, palpable peripheral pulses.  MUSCULOSKELETAL SYSTEM: Unremarkable.  CENTRAL NERVOUS SYSTEM: No focal neurologic deficit on gross examination.      Lab Results:  Recent Labs  04/10/13 2023 04/11/13 0521  WBC 11.1* 10.5  HGB 9.8* 10.4*  HCT 29.4* 30.7*  PLT 513* 499*    Recent Labs  04/10/13 1545 04/10/13 2023 04/11/13 0521  NA 136  --  136  K 2.6* 2.7* 3.1*  CL 95*  --  99  CO2 30  --  30  GLUCOSE 116*  --  116*  BUN 26*  --  22  CREATININE 1.81* 1.69* 1.48*  CALCIUM 9.5  --  9.1   No results found for this or any previous visit (from the past 240 hour(s)).   Studies/Results: No results found.  Medications: Scheduled Meds: . aspirin  325 mg Oral BID  . atenolol  50 mg Oral Daily  . cefTRIAXone (ROCEPHIN)  IV  1 g Intravenous Q24H  . cloNIDine  0.1 mg Oral q morning - 10a  . fluticasone  2 spray Each Nare QHS  . heparin  5,000 Units Subcutaneous Q8H  . loratadine  10 mg Oral Daily  . multivitamin with minerals  1 tablet Oral Daily  . potassium chloride  40 mEq Oral Once  . potassium chloride  40 mEq Oral Once  . sodium chloride  3 mL Intravenous Q12H   Continuous Infusions: . sodium chloride 100 mL/hr at 04/10/13 2149   PRN Meds:.acetaminophen, acetaminophen, morphine injection, ondansetron (ZOFRAN) IV, ondansetron,  oxyCODONE    LOS: 1 day   Sky Primo,CHRISTOPHER  [facility] Pager [phone]. If 8PM-8AM, please contact night-coverage at [email or web address], password [facility] 04/11/2013, 7:26 AM  LOS: 1 day

## 2013-04-11 NOTE — Progress Notes (Signed)
Patient had 7 beats of v.tach. Patient is asymptomatic, vital signs stable. Benedetto Coons NP was notified. Will continue to monitor patient.

## 2013-04-11 NOTE — Discharge Summary (Signed)
Physician Discharge Summary  Keith [name]:[id number] DOB: Mar 04, 1951 DOA: 04/10/2013  PCP: Evette Georges, MD  Admit date: 04/10/2013 Discharge date: 04/12/2013  Time spent: 40 minutes  Recommendations for Outpatient Follow-up:  1. Follow up with primary MD. 2. For ENT Follow up, to address sinus diease.  3. Recommended return to regular duties on 04/16/13.   Discharge Diagnoses:  Active Problems:   UTI (lower urinary tract infection)   Dehydration   ARF (acute renal failure)   Allergic rhinitis   Sinusitis   Hypokalemia   Anemia   HTN (hypertension)   Discharge Condition: Satisfactory.   Diet recommendation: Heart-Healthy.   Filed Weights   04/10/13 1843  Weight: 115.395 kg (254 lb 6.4 oz)    History of present illness:  62 year old male, with known history of HTN, Obesity, OA, DJD/Lumbar stenosis, impaired glucose tolerance and seasonal allergies. Patient had an E. Coli UTI/Pyelonephritis diagnosed during an ED visit 01/19/12, treated with Ciprofloxacin. According to patient, since 10/2012, he has been troubled by sinus problems/infection, and was treated With antibiotics (Omnicef/Amoxicillin) by PMD in 12/2012. He still continues to have nasal congestion and post nasal drip, with production of purulent phlegm, but denies facial pain or headache. Since 04/03/13, he has felt nauseated, without vomiting or abdominal pain, appetite has been poor, and he has had constipation, although relieved with laxatives. Last bowel movement was in AM of 04/10/13, after taking a laxative in the night of 04/09/13. Patient has experienced some difficulty urinating, noticed gross hematuria on 04/04/13-04/05/13. He has had temperatures of about 99 F, frequent chills and sweats. Because of progressive weakness, spouse brought him to the ED. Admitted for further management.    Hospital Course:  1. UTI (lower urinary tract infection): Patient presented with progressive weakness, chills, poor appetite  and nausea. No significant pyrexia, and wcc was borderline at 11.2. U/A revealed pyuria and bacteriuria, consistent with UTI. Managed initially with  with iv Rocephin. Urine culture remained negative, no pyrexia was recorded during hospitalization, and as of 04/11/13, wcc had normalized at 10.5. Clinically, patient felt considerably better, and appetite returned. Transitioned to oral Keflex, effective 04/12/13, and will conclude antibiotics on 04/16/13. 2. Dehydration/ARF (acute renal failure): Creatinine at presentation, was 1.8, BUN 26 (Baseline creatinine 1.1 on 01/12/13), consistent with ARF, likely pre-renal, secondary to poor oral intake, dehydration, volume depletion, in the setting of continuing diuretic therapy and NSAIDS. Diuretic and NSAIDs were discontinued, nephrotoxins were avoided, patient was managed with iv fluids, and renal indices, followed. As of 04/11/13, creatinine had normalized at 1.13.  3. Hypokalemia: This was profound, at 2.6. Patient had no concerning EKG changes, and the culprit is likely his diuretic therapy, which has been discontinued as mentioned above. Repleted as indicated. 4. Allergic rhinitis/Sinusitis: Patient has known seasonal allergies, and is currently symptomatic, with nasal congestion. In addition, he has been troubled by recurrent sinus infections since the beginning of the year, despite 2 courses of antibiotics in 12/2012. Physical examination revealed a purulent nasal discharge, as well as hyperemia and swelling of turbinates, practically occluding nasal passages, right>left. Addressed with above antibiotics, Flonase nasal spray and Claritin. Patient will benefit from ENT referral, on discharge.  5. Anemia: This is mild, normocytic. Anemia panel revealed mild iron deficiency and TSH is 0.417.  6. HTN (hypertension): Currently controlled. As described above, have discontinued diuretic, but continued all other pre-admission antihypertensives.    Procedures:  N/A.    Consultations:  N/A.   Discharge Exam: Filed Vitals:  04/11/13 0502 04/11/13 1300 04/11/13 2159 04/12/13 0522  BP: 124/69 122/70 122/69 129/78  Pulse: 65 68 61 64  Temp: 97.7 F (36.5 C) 97.9 F (36.6 C) 98.6 F (37 C) 98.8 F (37.1 C)  TempSrc: Oral Oral Oral Oral  Resp: 18 19 18 18   Height:      Weight:      SpO2: 100% 100% 99% 100%    General: Comfortable. Alert, communicative, fully oriented, talking in complete sentences, not short of breath at rest.  HEENT: Mild clinical pallor, no jaundice, no conjunctival injection or discharge. Hydration is fair. Nasal turbinates are swollen, hyperemic. There is no paranasal sinus tenderness.  NECK: Supple, JVP not seen, no carotid bruits, no palpable lymphadenopathy, no palpable goiter.  CHEST: Clinically clear to auscultation, no wheezes, no crackles.  HEART: Sounds 1 and 2 heard, normal, regular, no murmurs.  ABDOMEN: Moderately obese, soft, non-tender, no palpable organomegaly, no palpable masses, normal bowel sounds.  GENITALIA: Not examined.  LOWER EXTREMITIES: No pitting edema, palpable peripheral pulses.  MUSCULOSKELETAL SYSTEM: Unremarkable.  CENTRAL NERVOUS SYSTEM: No focal neurologic deficit on gross examination.    Discharge Instructions      Discharge Orders   Future Appointments Provider Department Dept Phone   04/13/2013 8:00 AM Cherlyn Cushing, RD [facility] [phone]   04/13/2013 9:00 AM Roderick Pee, MD [facility] [phone]   Future Orders Complete By Expires     Diet - low sodium heart healthy  As directed     Increase activity slowly  As directed         Medication List    STOP taking these medications       atenolol-chlorthalidone 50-25 MG per tablet  Commonly known as:  TENORETIC     ibuprofen 200 MG tablet  Commonly known as:  ADVIL,MOTRIN     KLOR-CON M10 10 MEQ tablet  Generic drug:  potassium chloride      TAKE these  medications       aspirin 325 MG tablet  Take 325 mg by mouth 2 (two) times daily.     atenolol 50 MG tablet  Commonly known as:  TENORMIN  Take 1 tablet (50 mg total) by mouth daily.     cephALEXin 500 MG capsule  Commonly known as:  KEFLEX  Take 1 capsule (500 mg total) by mouth 4 (four) times daily.     cloNIDine 0.1 MG tablet  Commonly known as:  CATAPRES  Take 0.1 mg by mouth every morning.     fluticasone 50 MCG/ACT nasal spray  Commonly known as:  FLONASE  Place 2 sprays into the nose at bedtime.     HYDROcodone-acetaminophen 10-325 MG per tablet  Commonly known as:  NORCO  Take 1 tablet by mouth every 6 (six) hours as needed for pain.     loratadine 10 MG tablet  Commonly known as:  CLARITIN  Take 1 tablet (10 mg total) by mouth daily.     multivitamin with minerals Tabs  Take 1 tablet by mouth daily.       No Known Allergies Follow-up Information   Call Evette Georges, MD.   Contact information:   [address] [facility] [place] [address] [phone]        The results of significant diagnostics from this hospitalization (including imaging, microbiology, ancillary and laboratory) are listed below for reference.    Significant Diagnostic Studies: No results found.  Microbiology: Recent Results (  from the past 240 hour(s))  URINE CULTURE     Status: None   Collection Time    04/10/13  3:55 PM      Result Value Range Status   Specimen Description URINE, CLEAN CATCH   Final   Special Requests NONE   Final   Culture  Setup Time 04/10/2013 23:09   Final   Colony Count >=100,000 COLONIES/ML   Final   Culture KLEBSIELLA PNEUMONIAE   Final   Report Status 04/12/2013 FINAL   Final   Organism ID, Bacteria KLEBSIELLA PNEUMONIAE   Final     Labs: Basic Metabolic Panel:  Recent Labs Lab 04/10/13 1545 04/10/13 2023 04/11/13 0521 04/11/13 1200 04/12/13 0525  NA 136  --  136  --  138  K 2.6* 2.7* 3.1* 3.0* 3.3*  CL 95*  --  99  --  103  CO2  30  --  30  --  26  GLUCOSE 116*  --  116*  --  113*  BUN 26*  --  22  --  14  CREATININE 1.81* 1.69* 1.48*  --  1.13  CALCIUM 9.5  --  9.1  --  9.6  MG  --  2.9* 2.6*  --   --    Liver Function Tests:  Recent Labs Lab 04/10/13 1545  AST 81*  ALT 131*  ALKPHOS 170*  BILITOT 0.2*  PROT 8.6*  ALBUMIN 2.9*    Recent Labs Lab 04/10/13 1545  LIPASE 28   No results found for this basename: AMMONIA,  in the last 168 hours CBC:  Recent Labs Lab 04/10/13 1545 04/10/13 2023 04/11/13 0521 04/12/13 0525  WBC 11.2* 11.1* 10.5 8.0  HGB 10.5* 9.8* 10.4* 10.3*  HCT 30.8* 29.4* 30.7* 31.1*  MCV 89.5 89.9 89.5 90.7  PLT 493* 513* 499* 551*   Cardiac Enzymes: No results found for this basename: CKTOTAL, CKMB, CKMBINDEX, TROPONINI,  in the last 168 hours BNP: BNP (last 3 results) No results found for this basename: PROBNP,  in the last 8760 hours CBG: No results found for this basename: GLUCAP,  in the last 168 hours     Signed:  Madalina Rosman,CHRISTOPHER  [facility] 04/12/2013, 1:12 PM

## 2013-04-12 LAB — URINE CULTURE: Colony Count: >=100000

## 2013-04-12 LAB — FOLATE: Folate: >20 ng/mL

## 2013-04-12 LAB — CBC
HCT: 31.1 % — ABNORMAL LOW (ref 39.0–52.0)
Hemoglobin: 10.3 g/dL — ABNORMAL LOW (ref 13.0–17.0)
MCH: 30 pg (ref 26.0–34.0)
MCHC: 33.1 g/dL (ref 30.0–36.0)
MCV: 90.7 fL (ref 78.0–100.0)
Platelets: 551 10*3/uL — ABNORMAL HIGH (ref 150–400)
RBC: 3.43 MIL/uL — ABNORMAL LOW (ref 4.22–5.81)
RDW: 14.9 % (ref 11.5–15.5)
WBC: 8 10*3/uL (ref 4.0–10.5)

## 2013-04-12 LAB — IRON AND TIBC
Iron: 38 ug/dL — ABNORMAL LOW (ref 42–135)
Saturation Ratios: 18 % — ABNORMAL LOW (ref 20–55)
TIBC: 211 ug/dL — ABNORMAL LOW (ref 215–435)
UIBC: 173 ug/dL (ref 125–400)

## 2013-04-12 LAB — BASIC METABOLIC PANEL
BUN: 14 mg/dL (ref 6–23)
CO2: 26 mEq/L (ref 19–32)
Calcium: 9.6 mg/dL (ref 8.4–10.5)
Chloride: 103 mEq/L (ref 96–112)
Creatinine, Ser: 1.13 mg/dL (ref 0.50–1.35)
GFR calc Af Amer: 80 mL/min — ABNORMAL LOW (ref >90)
GFR calc non Af Amer: 69 mL/min — ABNORMAL LOW (ref >90)
Glucose, Bld: 113 mg/dL — ABNORMAL HIGH (ref 70–99)
Potassium: 3.3 mEq/L — ABNORMAL LOW (ref 3.5–5.1)
Sodium: 138 mEq/L (ref 135–145)

## 2013-04-12 LAB — FERRITIN: Ferritin: 266 ng/mL (ref 22–322)

## 2013-04-12 LAB — TSH: TSH: 0.417 u[IU]/mL (ref 0.350–4.500)

## 2013-04-12 LAB — VITAMIN B12: Vitamin B-12: 1258 pg/mL — ABNORMAL HIGH (ref 211–911)

## 2013-04-12 MED ORDER — LORATADINE 10 MG PO TABS: 10.0000 mg | ORAL_TABLET | Freq: Every day | ORAL | Status: DC

## 2013-04-12 MED ORDER — CEPHALEXIN 500 MG PO CAPS: 500.0000 mg | ORAL_CAPSULE | Freq: Four times a day (QID) | ORAL | Status: DC

## 2013-04-12 MED ORDER — POTASSIUM CHLORIDE CRYS ER 20 MEQ PO TBCR
40.0000 meq | EXTENDED_RELEASE_TABLET | Freq: Once | ORAL | Status: AC
  Administered 2013-04-12: 40 meq via ORAL
  Filled 2013-04-12: qty 2

## 2013-04-12 MED ORDER — ATENOLOL 50 MG PO TABS: 50.0000 mg | ORAL_TABLET | Freq: Every day | ORAL | Status: DC

## 2013-04-13 ENCOUNTER — Ambulatory Visit: Payer: BC Managed Care – PPO | Admitting: Family Medicine

## 2013-04-13 ENCOUNTER — Ambulatory Visit: Payer: BC Managed Care – PPO | Admitting: *Deleted

## 2013-04-14 ENCOUNTER — Ambulatory Visit (INDEPENDENT_AMBULATORY_CARE_PROVIDER_SITE_OTHER): Payer: BC Managed Care – PPO | Admitting: Family Medicine

## 2013-04-14 ENCOUNTER — Encounter: Payer: Self-pay | Admitting: Family Medicine

## 2013-04-14 VITALS — BP 130/90 | Temp 99.4°F | Wt 258.0 lb

## 2013-04-14 DIAGNOSIS — E860 Dehydration: Secondary | ICD-10-CM

## 2013-04-14 DIAGNOSIS — M48061 Spinal stenosis, lumbar region without neurogenic claudication: Secondary | ICD-10-CM

## 2013-04-14 DIAGNOSIS — N179 Acute kidney failure, unspecified: Secondary | ICD-10-CM

## 2013-04-14 DIAGNOSIS — D649 Anemia, unspecified: Secondary | ICD-10-CM

## 2013-04-14 DIAGNOSIS — E876 Hypokalemia: Secondary | ICD-10-CM

## 2013-04-14 DIAGNOSIS — I10 Essential (primary) hypertension: Secondary | ICD-10-CM

## 2013-04-14 DIAGNOSIS — N390 Urinary tract infection, site not specified: Secondary | ICD-10-CM

## 2013-04-14 DIAGNOSIS — Z87440 Personal history of urinary (tract) infections: Secondary | ICD-10-CM

## 2013-04-14 LAB — BASIC METABOLIC PANEL
BUN: 16 mg/dL (ref 6–23)
CO2: 23 mEq/L (ref 19–32)
Calcium: 9.4 mg/dL (ref 8.4–10.5)
Chloride: 107 mEq/L (ref 96–112)
Creatinine, Ser: 1.3 mg/dL (ref 0.4–1.5)
GFR: 73.54 mL/min (ref >60.00)
Glucose, Bld: 81 mg/dL (ref 70–99)
Potassium: 3.5 mEq/L (ref 3.5–5.1)
Sodium: 142 mEq/L (ref 135–145)

## 2013-04-14 LAB — POCT URINALYSIS DIPSTICK
Bilirubin, UA: NEGATIVE
Glucose, UA: NEGATIVE
Ketones, UA: NEGATIVE
Leukocytes, UA: NEGATIVE
Nitrite, UA: NEGATIVE
Protein, UA: 30
Spec Grav, UA: 1.02
Urobilinogen, UA: 0.2
pH, UA: 6.5

## 2013-04-14 LAB — CBC WITH DIFFERENTIAL/PLATELET
Basophils Absolute: 0 10*3/uL (ref 0.0–0.1)
Basophils Relative: 0.3 % (ref 0.0–3.0)
Eosinophils Absolute: 0.2 10*3/uL (ref 0.0–0.7)
Eosinophils Relative: 2.2 % (ref 0.0–5.0)
HCT: 34.1 % — ABNORMAL LOW (ref 39.0–52.0)
Hemoglobin: 11.1 g/dL — ABNORMAL LOW (ref 13.0–17.0)
Lymphocytes Relative: 18.1 % (ref 12.0–46.0)
Lymphs Abs: 1.8 10*3/uL (ref 0.7–4.0)
MCHC: 32.7 g/dL (ref 30.0–36.0)
MCV: 94.1 fl (ref 78.0–100.0)
Monocytes Absolute: 1.1 10*3/uL — ABNORMAL HIGH (ref 0.1–1.0)
Monocytes Relative: 10.9 % (ref 3.0–12.0)
Neutro Abs: 7 10*3/uL (ref 1.4–7.7)
Neutrophils Relative %: 68.5 % (ref 43.0–77.0)
Platelets: 746 10*3/uL — ABNORMAL HIGH (ref 150.0–400.0)
RBC: 3.62 Mil/uL — ABNORMAL LOW (ref 4.22–5.81)
RDW: 14.7 % — ABNORMAL HIGH (ref 11.5–14.6)
WBC: 10.2 10*3/uL (ref 4.5–10.5)

## 2013-04-14 MED ORDER — CEPHALEXIN 500 MG PO CAPS: ORAL_CAPSULE | ORAL | Status: DC

## 2013-04-14 NOTE — Patient Instructions (Signed)
Drink lots of water  No Motrin over-the-counter anti-inflammatories in the future  Continue the Tenormin 50 mg for blood pressure control  Continue the Keflex but take 2 tabs twice daily  Followup in one week sooner if any problems or

## 2013-04-14 NOTE — Progress Notes (Signed)
  Subjective:    Patient ID: Keith [name], male    DOB: 08/23/1951, 62 y.o.   MRN: [id number]  HPIAdrian is a 62-year-old married male nonsmoker who comes in today for followup of a hospitalization for a urinary tract infection, dehydration, acute renal failure, hypo-K. Anemia, and mild anemia  He states about 3 weeks ago he began having symptoms of fatigue. He had a planned wedding at anniversary trip to [facility] which they will when on June 27. While he was there he started feeling bad with fever chills and sweating. He came back to [facility] on July 2 and he went to the hospital on the fourth. He was admitted with the above diagnoses. He was rehydrated given a potassium supplement his Tenoretic was stopped he was given plain Tenormin. He was given Keflex 4 times a day for the urinary tract infection subsequent culture grew out a Klebsiella. He was not given a potassium supplement. Also he had declining renal function. He states at that time it was very hot in [facility] and he has spinal stenosis and his back was bothering a lot. He was taken 8-10 Motrin tablets daily.  He was also noted to have a mild anemia with a hemoglobin of 10.5.  He feels back to normal now    Review of Systems    review of systems otherwise negative Objective:   Physical Exam  Well-developed well-nourished male no acute distress vital signs stable BP 130/90      Assessment & Plan:  Urinary tract infection continue Keflex  Acute renal failure secondary to Motrin avoid Motrin in the future  Hypokalemia corrected  Anemia recheck CBC

## 2013-04-23 ENCOUNTER — Ambulatory Visit (INDEPENDENT_AMBULATORY_CARE_PROVIDER_SITE_OTHER): Payer: BC Managed Care – PPO | Admitting: Family Medicine

## 2013-04-23 ENCOUNTER — Encounter: Payer: Self-pay | Admitting: Family Medicine

## 2013-04-23 VITALS — BP 130/90 | Temp 98.9°F | Wt 272.0 lb

## 2013-04-23 DIAGNOSIS — Z87448 Personal history of other diseases of urinary system: Secondary | ICD-10-CM

## 2013-04-23 DIAGNOSIS — D649 Anemia, unspecified: Secondary | ICD-10-CM

## 2013-04-23 DIAGNOSIS — N390 Urinary tract infection, site not specified: Secondary | ICD-10-CM

## 2013-04-23 DIAGNOSIS — M48061 Spinal stenosis, lumbar region without neurogenic claudication: Secondary | ICD-10-CM

## 2013-04-23 LAB — CBC WITH DIFFERENTIAL/PLATELET
Basophils Absolute: 0 10*3/uL (ref 0.0–0.1)
Basophils Relative: 0.6 % (ref 0.0–3.0)
Eosinophils Absolute: 0.2 10*3/uL (ref 0.0–0.7)
Eosinophils Relative: 4.1 % (ref 0.0–5.0)
HCT: 30.5 % — ABNORMAL LOW (ref 39.0–52.0)
Hemoglobin: 10 g/dL — ABNORMAL LOW (ref 13.0–17.0)
Lymphocytes Relative: 24.6 % (ref 12.0–46.0)
Lymphs Abs: 1.3 10*3/uL (ref 0.7–4.0)
MCHC: 32.8 g/dL (ref 30.0–36.0)
MCV: 94.9 fl (ref 78.0–100.0)
Monocytes Absolute: 0.6 10*3/uL (ref 0.1–1.0)
Monocytes Relative: 11.1 % (ref 3.0–12.0)
Neutro Abs: 3.2 10*3/uL (ref 1.4–7.7)
Neutrophils Relative %: 59.6 % (ref 43.0–77.0)
Platelets: 539 10*3/uL — ABNORMAL HIGH (ref 150.0–400.0)
RBC: 3.21 Mil/uL — ABNORMAL LOW (ref 4.22–5.81)
RDW: 15.5 % — ABNORMAL HIGH (ref 11.5–14.6)
WBC: 5.4 10*3/uL (ref 4.5–10.5)

## 2013-04-23 LAB — BASIC METABOLIC PANEL
BUN: 12 mg/dL (ref 6–23)
CO2: 26 mEq/L (ref 19–32)
Calcium: 9.5 mg/dL (ref 8.4–10.5)
Chloride: 109 mEq/L (ref 96–112)
Creatinine, Ser: 1.3 mg/dL (ref 0.4–1.5)
GFR: 73.54 mL/min (ref >60.00)
Glucose, Bld: 87 mg/dL (ref 70–99)
Potassium: 4.3 mEq/L (ref 3.5–5.1)
Sodium: 142 mEq/L (ref 135–145)

## 2013-04-23 LAB — POCT URINALYSIS DIPSTICK
Bilirubin, UA: NEGATIVE
Blood, UA: NEGATIVE
Glucose, UA: NEGATIVE
Ketones, UA: NEGATIVE
Leukocytes, UA: NEGATIVE
Nitrite, UA: NEGATIVE
Protein, UA: 30
Spec Grav, UA: 1.015
Urobilinogen, UA: 0.2
pH, UA: 6.5

## 2013-04-23 NOTE — Progress Notes (Signed)
  Subjective:    Patient ID: Keith [name], male    DOB: 02/26/1951, 62 y.o.   MRN: [id number]  HPI  [name] a delightful 62 year old who comes in today for followup of acute renal failure and acute anemia secondary to Motrin  The details of his history are in his previous note. His electrolytes have returned back to normal. He's no longer taking any NSAIDs. He does take the Vicodin 10 mg 4 times daily because of back pain. The etiology of his back pain and spinal stenosis. He's currently been treated by his niece Dr. B.,,,,,,,,, with conservative therapy. Last year had 3 injections. The first worked the second didn't help at all nor did the third. He's reluctant at this juncture to see a spine surgeon  His blood pressure is 130/90  Electrolytes return a normal CBC still abnormal we'll followup on that.   Review of Systems    review of systems otherwise negative Objective:   Physical Exam Well-developed well-nourished male in no acute distress vital signs stable weight 272 BP 130/90       Assessment & Plan:  Hypertension at goal continue current therapy  Acute renal failure resolved  Anemia followup CBC

## 2013-04-23 NOTE — Patient Instructions (Signed)
Labs today  Continue your current management medications  Again avoid all over-the-counter anti-inflammatories. Drink lots of water  Will call you I gets her lab work back and we'll decide about followup

## 2013-05-27 ENCOUNTER — Other Ambulatory Visit: Payer: Self-pay | Admitting: Otolaryngology

## 2013-07-21 ENCOUNTER — Other Ambulatory Visit: Payer: Self-pay | Admitting: Physical Medicine and Rehabilitation

## 2013-07-21 DIAGNOSIS — M5410 Radiculopathy, site unspecified: Secondary | ICD-10-CM

## 2013-07-26 ENCOUNTER — Ambulatory Visit
Admission: RE | Admit: 2013-07-26 | Discharge: 2013-07-26 | Disposition: A | Discharge status: Home / Self Care | Payer: BC Managed Care – PPO | Source: Ambulatory Visit | Attending: Physical Medicine and Rehabilitation | Admitting: Physical Medicine and Rehabilitation

## 2013-07-26 DIAGNOSIS — M5410 Radiculopathy, site unspecified: Secondary | ICD-10-CM

## 2013-10-19 ENCOUNTER — Other Ambulatory Visit: Payer: Self-pay | Admitting: Family Medicine

## 2013-11-02 ENCOUNTER — Other Ambulatory Visit (INDEPENDENT_AMBULATORY_CARE_PROVIDER_SITE_OTHER): Payer: BC Managed Care – PPO

## 2013-11-02 DIAGNOSIS — Z0000 Encounter for general adult medical examination without abnormal findings: Secondary | ICD-10-CM

## 2013-11-02 LAB — BASIC METABOLIC PANEL
BUN: 13 mg/dL (ref 6–23)
CO2: 25 mEq/L (ref 19–32)
Calcium: 10 mg/dL (ref 8.4–10.5)
Chloride: 105 mEq/L (ref 96–112)
Creatinine, Ser: 1.1 mg/dL (ref 0.4–1.5)
GFR: 83.91 mL/min (ref >60.00)
Glucose, Bld: 120 mg/dL — ABNORMAL HIGH (ref 70–99)
Potassium: 3.3 mEq/L — ABNORMAL LOW (ref 3.5–5.1)
Sodium: 140 mEq/L (ref 135–145)

## 2013-11-02 LAB — LIPID PANEL
Cholesterol: 184 mg/dL (ref 0–200)
HDL: 51.8 mg/dL (ref >39.00)
LDL Cholesterol: 117 mg/dL — ABNORMAL HIGH (ref 0–99)
Total CHOL/HDL Ratio: 4
Triglycerides: 77 mg/dL (ref 0.0–149.0)
VLDL: 15.4 mg/dL (ref 0.0–40.0)

## 2013-11-02 LAB — POCT URINALYSIS DIPSTICK
Bilirubin, UA: NEGATIVE
Blood, UA: NEGATIVE
Glucose, UA: NEGATIVE
Leukocytes, UA: NEGATIVE
Nitrite, UA: NEGATIVE
Spec Grav, UA: >=1.03
Urobilinogen, UA: 0.2
pH, UA: 5.5

## 2013-11-02 LAB — HEPATIC FUNCTION PANEL
ALT: 29 U/L (ref 0–53)
AST: 39 U/L — ABNORMAL HIGH (ref 0–37)
Albumin: 4.1 g/dL (ref 3.5–5.2)
Alkaline Phosphatase: 87 U/L (ref 39–117)
Bilirubin, Direct: 0 mg/dL (ref 0.0–0.3)
Total Bilirubin: 0.6 mg/dL (ref 0.3–1.2)
Total Protein: 8.1 g/dL (ref 6.0–8.3)

## 2013-11-02 LAB — CBC WITH DIFFERENTIAL/PLATELET
Basophils Absolute: 0 10*3/uL (ref 0.0–0.1)
Basophils Relative: 0.5 % (ref 0.0–3.0)
Eosinophils Absolute: 0.2 10*3/uL (ref 0.0–0.7)
Eosinophils Relative: 3.4 % (ref 0.0–5.0)
HCT: 40 % (ref 39.0–52.0)
Hemoglobin: 13.4 g/dL (ref 13.0–17.0)
Lymphocytes Relative: 28.2 % (ref 12.0–46.0)
Lymphs Abs: 1.4 10*3/uL (ref 0.7–4.0)
MCHC: 33.3 g/dL (ref 30.0–36.0)
MCV: 92.6 fl (ref 78.0–100.0)
Monocytes Absolute: 0.6 10*3/uL (ref 0.1–1.0)
Monocytes Relative: 12.8 % — ABNORMAL HIGH (ref 3.0–12.0)
Neutro Abs: 2.7 10*3/uL (ref 1.4–7.7)
Neutrophils Relative %: 55.1 % (ref 43.0–77.0)
Platelets: 289 10*3/uL (ref 150.0–400.0)
RBC: 4.33 Mil/uL (ref 4.22–5.81)
RDW: 16.7 % — ABNORMAL HIGH (ref 11.5–14.6)
WBC: 4.9 10*3/uL (ref 4.5–10.5)

## 2013-11-02 LAB — PSA: PSA: 0.59 ng/mL (ref 0.10–4.00)

## 2013-11-02 LAB — TSH: TSH: 0.73 u[IU]/mL (ref 0.35–5.50)

## 2013-11-09 ENCOUNTER — Ambulatory Visit (INDEPENDENT_AMBULATORY_CARE_PROVIDER_SITE_OTHER): Payer: BC Managed Care – PPO | Admitting: Family Medicine

## 2013-11-09 ENCOUNTER — Encounter: Payer: Self-pay | Admitting: Family Medicine

## 2013-11-09 VITALS — BP 140/80 | Temp 99.0°F | Ht 71.0 in | Wt 278.0 lb

## 2013-11-09 DIAGNOSIS — E669 Obesity, unspecified: Secondary | ICD-10-CM

## 2013-11-09 DIAGNOSIS — Z23 Encounter for immunization: Secondary | ICD-10-CM

## 2013-11-09 DIAGNOSIS — F528 Other sexual dysfunction not due to a substance or known physiological condition: Secondary | ICD-10-CM

## 2013-11-09 DIAGNOSIS — R7309 Other abnormal glucose: Secondary | ICD-10-CM

## 2013-11-09 DIAGNOSIS — I10 Essential (primary) hypertension: Secondary | ICD-10-CM

## 2013-11-09 DIAGNOSIS — M1990 Unspecified osteoarthritis, unspecified site: Secondary | ICD-10-CM

## 2013-11-09 MED ORDER — ATENOLOL-CHLORTHALIDONE 50-25 MG PO TABS: ORAL_TABLET | ORAL | Status: DC

## 2013-11-09 MED ORDER — CLONIDINE HCL 0.1 MG PO TABS: 0.1000 mg | ORAL_TABLET | Freq: Every morning | ORAL | Status: DC

## 2013-11-09 MED ORDER — FLUTICASONE PROPIONATE 50 MCG/ACT NA SUSP: 2.0000 | Freq: Every day | NASAL | Status: DC

## 2013-11-09 NOTE — Patient Instructions (Addendum)
Continue your current medications  Work hard in the next 12 months on your diet exercise and weight loss  Return in one year sooner if any problems  Dr. Bing Plume for eye exam  Dr. Gloriann Loan for dental exam

## 2013-11-09 NOTE — Progress Notes (Signed)
   Subjective:    Patient ID: Keith [name], male    DOB: [birth year], 63 y.o.   MRN: [id number]  HPI Keith [name] is a 63 year old male who comes in for general physical examination because of a history of hypertension, allergic rhinitis and obesity  In August he had a kidney infection required 4 days hospitalizations  He had polyps removed by ENT in the fall  December 12 he had lumbar back surgery L5 discectomy. Weight still high at 278 pounds  Med list reviewed unchanged  Vaccinations up to date.   Review of Systems  Unable to perform ROS Constitutional: Negative.   HENT: Negative.   Eyes: Negative.   Respiratory: Negative.   Cardiovascular: Negative.   Gastrointestinal: Negative.   Endocrine: Negative.   Genitourinary: Negative.   Musculoskeletal: Negative.   Skin: Negative.   Allergic/Immunologic: Negative.   Neurological: Negative.   Hematological: Negative.   Psychiatric/Behavioral: Negative.   All other systems reviewed and are negative.       Objective:   Physical Exam  Nursing note and vitals reviewed. Constitutional: He is oriented to person, place, and time. He appears well-developed and well-nourished.  HENT:  Head: Normocephalic and atraumatic.  Right Ear: External ear normal.  Left Ear: External ear normal.  Nose: Nose normal.  Mouth/Throat: Oropharynx is clear and moist.  Eyes: Conjunctivae and EOM are normal. Pupils are equal, round, and reactive to light.  Neck: Normal range of motion. Neck supple. No JVD present. No tracheal deviation present. No thyromegaly present.  Cardiovascular: Normal rate, regular rhythm, normal heart sounds and intact distal pulses.  Exam reveals no gallop and no friction rub.   No murmur heard. Pulmonary/Chest: Effort normal and breath sounds normal. No stridor. No respiratory distress. He has no wheezes. He has no rales. He exhibits no tenderness.  Abdominal: Soft. Bowel sounds are normal. He exhibits no distension and no  mass. There is no tenderness. There is no rebound and no guarding.  Genitourinary: Rectum normal, prostate normal and penis normal. Guaiac negative stool. No penile tenderness.  Musculoskeletal: Normal range of motion. He exhibits no edema and no tenderness.  Deformity chronic left foot  Lymphadenopathy:    He has no cervical adenopathy.  Neurological: He is alert and oriented to person, place, and time. He has normal reflexes. No cranial nerve deficit. He exhibits normal muscle tone.  Skin: Skin is warm and dry. No rash noted. No erythema. No pallor.  Well healed scar lumbar ear from previous disc surgery  Psychiatric: He has a normal mood and affect. His behavior is normal. Judgment and thought content normal.          Assessment & Plan:  Healthy male  Hypertension at goal continue current therapy  Allergic rhinitis continue steroid nasal spray  Status post urinary tract infection  Status post polyp removal  Status post lumbar disc surgery  Obesity............ discussed diet exercise and weight loss

## 2013-11-10 ENCOUNTER — Telehealth: Payer: Self-pay | Admitting: Family Medicine

## 2013-11-10 NOTE — Telephone Encounter (Signed)
Relevant patient education assigned to patient using Emmi. ° °

## 2013-11-23 ENCOUNTER — Other Ambulatory Visit: Payer: Self-pay | Admitting: Family Medicine

## 2013-12-04 ENCOUNTER — Other Ambulatory Visit: Payer: Self-pay | Admitting: Family Medicine

## 2014-01-13 ENCOUNTER — Other Ambulatory Visit: Payer: Self-pay | Admitting: Family Medicine

## 2014-08-29 ENCOUNTER — Encounter (HOSPITAL_COMMUNITY)
Admission: EM | Disposition: A | Discharge status: Home / Self Care | Payer: BC Managed Care – PPO | Source: Home / Self Care | Attending: Internal Medicine

## 2014-08-29 ENCOUNTER — Encounter (HOSPITAL_COMMUNITY)
Admission: EM | Disposition: A | Discharge status: Home / Self Care | Payer: Self-pay | Source: Home / Self Care | Attending: Internal Medicine

## 2014-08-29 ENCOUNTER — Encounter (HOSPITAL_COMMUNITY): Payer: Self-pay | Admitting: Emergency Medicine

## 2014-08-29 ENCOUNTER — Emergency Department (HOSPITAL_COMMUNITY): Payer: BC Managed Care – PPO

## 2014-08-29 ENCOUNTER — Inpatient Hospital Stay (HOSPITAL_COMMUNITY)
Admission: EM | Admit: 2014-08-29 | Discharge: 2014-08-31 | DRG: 811 | Disposition: A | Discharge status: Home / Self Care | Payer: BC Managed Care – PPO | Attending: Internal Medicine | Admitting: Internal Medicine

## 2014-08-29 DIAGNOSIS — I10 Essential (primary) hypertension: Secondary | ICD-10-CM | POA: Diagnosis present

## 2014-08-29 DIAGNOSIS — Z87891 Personal history of nicotine dependence: Secondary | ICD-10-CM

## 2014-08-29 DIAGNOSIS — K222 Esophageal obstruction: Secondary | ICD-10-CM | POA: Diagnosis present

## 2014-08-29 DIAGNOSIS — D62 Acute posthemorrhagic anemia: Secondary | ICD-10-CM | POA: Diagnosis present

## 2014-08-29 DIAGNOSIS — Z6835 Body mass index (BMI) 35.0-35.9, adult: Secondary | ICD-10-CM

## 2014-08-29 DIAGNOSIS — K922 Gastrointestinal hemorrhage, unspecified: Secondary | ICD-10-CM | POA: Diagnosis not present

## 2014-08-29 DIAGNOSIS — M549 Dorsalgia, unspecified: Secondary | ICD-10-CM | POA: Diagnosis present

## 2014-08-29 DIAGNOSIS — K279 Peptic ulcer, site unspecified, unspecified as acute or chronic, without hemorrhage or perforation: Secondary | ICD-10-CM | POA: Diagnosis present

## 2014-08-29 DIAGNOSIS — K920 Hematemesis: Secondary | ICD-10-CM | POA: Diagnosis present

## 2014-08-29 DIAGNOSIS — T39015A Adverse effect of aspirin, initial encounter: Secondary | ICD-10-CM | POA: Diagnosis present

## 2014-08-29 DIAGNOSIS — Z22322 Carrier or suspected carrier of Methicillin resistant Staphylococcus aureus: Secondary | ICD-10-CM

## 2014-08-29 DIAGNOSIS — M4806 Spinal stenosis, lumbar region: Secondary | ICD-10-CM | POA: Diagnosis present

## 2014-08-29 DIAGNOSIS — Z7982 Long term (current) use of aspirin: Secondary | ICD-10-CM

## 2014-08-29 DIAGNOSIS — Z79899 Other long term (current) drug therapy: Secondary | ICD-10-CM | POA: Diagnosis not present

## 2014-08-29 DIAGNOSIS — E669 Obesity, unspecified: Secondary | ICD-10-CM | POA: Diagnosis present

## 2014-08-29 DIAGNOSIS — K264 Chronic or unspecified duodenal ulcer with hemorrhage: Secondary | ICD-10-CM | POA: Diagnosis present

## 2014-08-29 DIAGNOSIS — E876 Hypokalemia: Secondary | ICD-10-CM | POA: Diagnosis not present

## 2014-08-29 DIAGNOSIS — M1990 Unspecified osteoarthritis, unspecified site: Secondary | ICD-10-CM | POA: Diagnosis present

## 2014-08-29 DIAGNOSIS — K449 Diaphragmatic hernia without obstruction or gangrene: Secondary | ICD-10-CM

## 2014-08-29 DIAGNOSIS — G8929 Other chronic pain: Secondary | ICD-10-CM | POA: Diagnosis present

## 2014-08-29 DIAGNOSIS — M48061 Spinal stenosis, lumbar region without neurogenic claudication: Secondary | ICD-10-CM | POA: Diagnosis present

## 2014-08-29 DIAGNOSIS — Z87440 Personal history of urinary (tract) infections: Secondary | ICD-10-CM | POA: Diagnosis not present

## 2014-08-29 DIAGNOSIS — N529 Male erectile dysfunction, unspecified: Secondary | ICD-10-CM | POA: Diagnosis present

## 2014-08-29 DIAGNOSIS — R1013 Epigastric pain: Secondary | ICD-10-CM

## 2014-08-29 HISTORY — DX: Diaphragmatic hernia without obstruction or gangrene: K44.9

## 2014-08-29 HISTORY — DX: Carrier or suspected carrier of methicillin resistant Staphylococcus aureus: Z22.322

## 2014-08-29 HISTORY — DX: Esophageal obstruction: K22.2

## 2014-08-29 HISTORY — DX: Other chronic pain: G89.29

## 2014-08-29 HISTORY — DX: Chronic or unspecified duodenal ulcer with hemorrhage: K26.4

## 2014-08-29 HISTORY — DX: Dorsalgia, unspecified: M54.9

## 2014-08-29 HISTORY — PX: ESOPHAGOGASTRODUODENOSCOPY: SHX5428

## 2014-08-29 LAB — CBC
HCT: 22.9 % — ABNORMAL LOW (ref 39.0–52.0)
Hemoglobin: 7.9 g/dL — ABNORMAL LOW (ref 13.0–17.0)
MCH: 32.4 pg (ref 26.0–34.0)
MCHC: 34.5 g/dL (ref 30.0–36.0)
MCV: 93.9 fL (ref 78.0–100.0)
Platelets: 275 10*3/uL (ref 150–400)
RBC: 2.44 MIL/uL — ABNORMAL LOW (ref 4.22–5.81)
RDW: 13.6 % (ref 11.5–15.5)
WBC: 8.6 10*3/uL (ref 4.0–10.5)

## 2014-08-29 LAB — BASIC METABOLIC PANEL
Anion gap: 14 (ref 5–15)
BUN: 23 mg/dL (ref 6–23)
CO2: 27 mEq/L (ref 19–32)
Calcium: 9.4 mg/dL (ref 8.4–10.5)
Chloride: 102 mEq/L (ref 96–112)
Creatinine, Ser: 0.94 mg/dL (ref 0.50–1.35)
GFR calc Af Amer: >90 mL/min (ref >90)
GFR calc non Af Amer: 88 mL/min — ABNORMAL LOW (ref >90)
Glucose, Bld: 124 mg/dL — ABNORMAL HIGH (ref 70–99)
Potassium: 3.5 mEq/L — ABNORMAL LOW (ref 3.7–5.3)
Sodium: 143 mEq/L (ref 137–147)

## 2014-08-29 LAB — PREPARE RBC (CROSSMATCH)

## 2014-08-29 LAB — I-STAT TROPONIN, ED: Troponin i, poc: 0.02 ng/mL (ref 0.00–0.08)

## 2014-08-29 LAB — ABO/RH: ABO/RH(D): O POS

## 2014-08-29 LAB — MRSA PCR SCREENING: MRSA by PCR: POSITIVE — AB

## 2014-08-29 LAB — POC OCCULT BLOOD, ED: Fecal Occult Bld: POSITIVE — AB

## 2014-08-29 LAB — LACTIC ACID, PLASMA: Lactic Acid, Venous: 2.6 mmol/L — ABNORMAL HIGH (ref 0.5–2.2)

## 2014-08-29 SURGERY — EGD (ESOPHAGOGASTRODUODENOSCOPY)
Anesthesia: Moderate Sedation

## 2014-08-29 MED ORDER — SODIUM CHLORIDE 0.9 % IV SOLN: Freq: Once | INTRAVENOUS | Status: DC

## 2014-08-29 MED ORDER — ONDANSETRON HCL 4 MG/2ML IJ SOLN: 4.0000 mg | Freq: Three times a day (TID) | INTRAMUSCULAR | Status: DC | PRN

## 2014-08-29 MED ORDER — OXYCODONE-ACETAMINOPHEN 5-325 MG PO TABS
1.0000 | ORAL_TABLET | Freq: Four times a day (QID) | ORAL | Status: DC | PRN
  Administered 2014-08-30 – 2014-08-31 (×2): 1 via ORAL
  Filled 2014-08-29 (×2): qty 1

## 2014-08-29 MED ORDER — SODIUM CHLORIDE 0.9 % IV SOLN: INTRAVENOUS | Status: DC

## 2014-08-29 MED ORDER — DIPHENHYDRAMINE HCL 50 MG/ML IJ SOLN
INTRAMUSCULAR | Status: AC
  Filled 2014-08-29: qty 1

## 2014-08-29 MED ORDER — ADULT MULTIVITAMIN W/MINERALS CH
1.0000 | ORAL_TABLET | Freq: Every day | ORAL | Status: DC
  Administered 2014-08-30 – 2014-08-31 (×2): 1 via ORAL
  Filled 2014-08-29 (×2): qty 1

## 2014-08-29 MED ORDER — LORATADINE 10 MG PO TABS
10.0000 mg | ORAL_TABLET | Freq: Every day | ORAL | Status: DC
  Administered 2014-08-30 – 2014-08-31 (×2): 10 mg via ORAL
  Filled 2014-08-29 (×2): qty 1

## 2014-08-29 MED ORDER — GABAPENTIN 400 MG PO CAPS
400.0000 mg | ORAL_CAPSULE | Freq: Every evening | ORAL | Status: DC
  Administered 2014-08-30 – 2014-08-31 (×2): 400 mg via ORAL
  Filled 2014-08-29 (×2): qty 1

## 2014-08-29 MED ORDER — MIDAZOLAM HCL 10 MG/2ML IJ SOLN
INTRAMUSCULAR | Status: AC
  Filled 2014-08-29: qty 2

## 2014-08-29 MED ORDER — MUPIROCIN 2 % EX OINT
TOPICAL_OINTMENT | Freq: Two times a day (BID) | CUTANEOUS | Status: DC
Start: 2014-08-30 — End: 2014-08-31
  Administered 2014-08-30 – 2014-08-31 (×3): via NASAL
  Filled 2014-08-29 (×2): qty 22

## 2014-08-29 MED ORDER — ACETAMINOPHEN 325 MG PO TABS: 650.0000 mg | ORAL_TABLET | Freq: Four times a day (QID) | ORAL | Status: DC | PRN

## 2014-08-29 MED ORDER — OXYCODONE HCL 5 MG PO TABS
5.0000 mg | ORAL_TABLET | Freq: Four times a day (QID) | ORAL | Status: DC | PRN
  Administered 2014-08-29 – 2014-08-30 (×3): 5 mg via ORAL
  Filled 2014-08-29 (×3): qty 1

## 2014-08-29 MED ORDER — ONDANSETRON HCL 4 MG PO TABS: 4.0000 mg | ORAL_TABLET | Freq: Four times a day (QID) | ORAL | Status: DC | PRN

## 2014-08-29 MED ORDER — OXYCODONE-ACETAMINOPHEN 10-325 MG PO TABS: 1.0000 | ORAL_TABLET | Freq: Four times a day (QID) | ORAL | Status: DC | PRN

## 2014-08-29 MED ORDER — MORPHINE SULFATE 4 MG/ML IJ SOLN: 4.0000 mg | INTRAMUSCULAR | Status: DC | PRN

## 2014-08-29 MED ORDER — MIDAZOLAM HCL 10 MG/2ML IJ SOLN
INTRAMUSCULAR | Status: DC | PRN
  Administered 2014-08-29: 1 mg via INTRAVENOUS
  Administered 2014-08-29 (×3): 2 mg via INTRAVENOUS

## 2014-08-29 MED ORDER — PANTOPRAZOLE SODIUM 40 MG IV SOLR
40.0000 mg | Freq: Two times a day (BID) | INTRAVENOUS | Status: DC
  Administered 2014-08-29 – 2014-08-31 (×4): 40 mg via INTRAVENOUS
  Filled 2014-08-29 (×5): qty 40

## 2014-08-29 MED ORDER — SODIUM CHLORIDE 0.9 % IJ SOLN
3.0000 mL | Freq: Two times a day (BID) | INTRAMUSCULAR | Status: DC
  Administered 2014-08-29 – 2014-08-30 (×3): 3 mL via INTRAVENOUS

## 2014-08-29 MED ORDER — BUTAMBEN-TETRACAINE-BENZOCAINE 2-2-14 % EX AERO
INHALATION_SPRAY | CUTANEOUS | Status: DC | PRN
Start: 2014-08-29 — End: 2014-08-29
  Administered 2014-08-29: 2 via TOPICAL

## 2014-08-29 MED ORDER — CHLORHEXIDINE GLUCONATE CLOTH 2 % EX PADS
6.0000 | MEDICATED_PAD | Freq: Every day | CUTANEOUS | Status: DC
Start: 2014-08-30 — End: 2014-08-31
  Administered 2014-08-30 – 2014-08-31 (×2): 6 via TOPICAL

## 2014-08-29 MED ORDER — ACETAMINOPHEN 650 MG RE SUPP: 650.0000 mg | Freq: Four times a day (QID) | RECTAL | Status: DC | PRN

## 2014-08-29 MED ORDER — ONDANSETRON HCL 4 MG/2ML IJ SOLN: 4.0000 mg | Freq: Four times a day (QID) | INTRAMUSCULAR | Status: DC | PRN

## 2014-08-29 MED ORDER — FENTANYL CITRATE 0.05 MG/ML IJ SOLN
INTRAMUSCULAR | Status: DC | PRN
  Administered 2014-08-29 (×2): 25 ug via INTRAVENOUS
  Administered 2014-08-29 (×2): 12.5 ug via INTRAVENOUS

## 2014-08-29 MED ORDER — FENTANYL CITRATE 0.05 MG/ML IJ SOLN
INTRAMUSCULAR | Status: AC
  Filled 2014-08-29: qty 2

## 2014-08-29 MED ORDER — SODIUM CHLORIDE 0.9 % IJ SOLN
PREFILLED_SYRINGE | INTRAMUSCULAR | Status: DC | PRN
  Administered 2014-08-29: 5 mL

## 2014-08-29 MED ORDER — SODIUM CHLORIDE 0.9 % IV BOLUS (SEPSIS)
500.0000 mL | Freq: Once | INTRAVENOUS | Status: AC
  Administered 2014-08-29: 500 mL via INTRAVENOUS

## 2014-08-29 MED ORDER — EPINEPHRINE HCL 0.1 MG/ML IJ SOSY
PREFILLED_SYRINGE | INTRAMUSCULAR | Status: AC
  Filled 2014-08-29: qty 10

## 2014-08-29 MED ORDER — FLUTICASONE PROPIONATE 50 MCG/ACT NA SUSP
2.0000 | Freq: Every day | NASAL | Status: DC
  Administered 2014-08-29 – 2014-08-30 (×2): 2 via NASAL
  Filled 2014-08-29 (×2): qty 16

## 2014-08-29 MED ORDER — POTASSIUM CHLORIDE IN NACL 20-0.9 MEQ/L-% IV SOLN
INTRAVENOUS | Status: DC
  Administered 2014-08-29 – 2014-08-31 (×3): via INTRAVENOUS
  Filled 2014-08-29 (×6): qty 1000

## 2014-08-29 NOTE — Op Note (Signed)
[facility] [place], [address]   ENDOSCOPY PROCEDURE REPORT  PATIENT: [name], [name]  MR#: [id number] BIRTHDATE: 1950-12-26 , 47  yrs. old GENDER: male ENDOSCOPIST: Lafayette Dragon, MD REFERRED BY:  Jacquelynn Cree, M.D. , Dr Duffy Rhody.Todd PROCEDURE DATE:  08/29/2014 PROCEDURE:  EGD w/ biopsy and EGD w/ control of bleeding ASA CLASS:     Class II INDICATIONS:  melena, hematemesis, acute post hemorrhagic anemia, and ASA for chronic back pain up to 1500mg /day. MEDICATIONS: Fentanyl 75 mcg IV, Versed 7 mg IV, and Epinephrine 1:10 000 5cc TOPICAL ANESTHETIC: Cetacaine Spray  DESCRIPTION OF PROCEDURE: After the risks benefits and alternatives of the procedure were thoroughly explained, informed consent was obtained.  The Pentax Gastroscope [id number] endoscope was introduced through the mouth and advanced to the second portion of the duodenum , Without limitations.  The instrument was slowly withdrawn as the mucosa was fully examined.    Esophagus:[  proximal mid and distal esophageal mucosa was normal. There was a nonobstructing esophageal stricture at 35 cm from the incisors which allowed the endoscope to traverse into small hiatal hernia  Stomach: there was no blood in the stomach. There was no gastritis. Pyloric outlet was normal. Retroflexion of the endoscope revealed a small hiatal hernia biopsies were taken from gastric antrum to rule rule out H. pylori Duodenum: there were extensive duodenal ulcerations starting in the duodenal bulb and extending through the duodenal outlet. At least 2 medium-size ulcerations measuring 1-2 cm in diameter  benign appearing, were rather shallow. 2  were oozing small amount of bright red blood from the egdes, no arterial bleed. The oozing could not be cleared with irrigation. No outlet obstruction. Second portion  duodenum was normal. 2 bleeding ulcers were injected with epinephrine ,total of 5 cc in 5 separate  locations at the margin of the ulcers. Bleeding stopped. There was no particular bleeding vessel that could be clipped with endoclips. After the epinephrine injection the site was irrigated copiously and no active bleeding was observed.       The scope was then withdrawn from the patient and the procedure completed.  COMPLICATIONS: There were no immediate complications.  ENDOSCOPIC IMPRESSION: 1. multiple duodenal bulb and duodenal outlet ulcerations with slow bleeding. Hemostasis achieved with multiple injections of it epinephrine into the edges of the ulcers. Total of 5 cc 2. Nonobstructing distal esophageal stricture not dilated 3. 2 cm reducible hiatal hernia 4. Biopsy from the gastric antrum to rule out H. pylori  RECOMMENDATIONS  bowel rest tonight Follow hemoglobin and hematocrit through the night and next 24 hours aspirin has been discontinued continue Protonix IV Await biopsies to rule out H. pylori and treat appropriately if positive Discussed with the patient pain management for his low back pain Will most likely need oral iron supplements alone for 4-6 weeks long-term acid suppressing therapy  REPEAT EXAM:pending Path report  eSigned:  Lafayette Dragon, MD 08/29/2014 7:10 PM    CC:  PATIENT NAME:  Nayan, Proch MR#: [id number]

## 2014-08-29 NOTE — ED Notes (Signed)
Hospitalist at bedside 

## 2014-08-29 NOTE — ED Notes (Signed)
Pt ambulated to restroom with steady gait.

## 2014-08-29 NOTE — ED Notes (Signed)
RN AWARE OF TRANSFUSION OF TWO UNITS PRBC

## 2014-08-29 NOTE — ED Provider Notes (Signed)
CSN: [id number]     Arrival date & time 08/29/14  1413 History   First MD Initiated Contact with Patient 08/29/14 1458     Chief Complaint  Patient presents with  . Chest Pain  . Hematemesis  . Rectal Bleeding     (Consider location/radiation/quality/duration/timing/severity/associated sxs/prior Treatment) HPI Comments: 63 year old male with history of obesity, high blood pressure, anemia presents with hematemesis and melena. Patient had one episode of hematemesis yesterday and mild abdominal discomfort that resolved. Patient's had melena for a few days. No history of similar. Patient is not on blood thinners however has been taking approximately 1000 1500 mg of aspirin a day for 1 year for chronic back pain. Patient had a colonoscopy partially 5 years ago unknown which grew get it, was told it was okay. Patient has fatigue worsening past few days. Nothing improves symptoms  Patient is a 63 y.o. male presenting with chest pain and hematochezia. The history is provided by the patient.  Chest Pain Associated symptoms: abdominal pain, fatigue, nausea and vomiting   Associated symptoms: no back pain, no fever, no headache and no shortness of breath   Rectal Bleeding Associated symptoms: abdominal pain and vomiting   Associated symptoms: no fever and no light-headedness     Past Medical History  Diagnosis Date  . Hypertension   . Obesity   . ED (erectile dysfunction)   . Glucose intolerance (impaired glucose tolerance)   . Osteoarthritis   . Allergy   . Spinal stenosis of lumbar region   . Urinary tract infection 04/11/2013  . Hypokalemia 04/11/2013  . Chronic back pain    Past Surgical History  Procedure Laterality Date  . Tonsillectomy    . Lumbar laminectomy    . Nasal polyp surgery     Family History  Problem Relation Age of Onset  . Diabetes Mother   . Asthma Mother   . Hypertension Father   . Stroke Father    History  Substance Use Topics  . Smoking status: Former  Smoker -- 1.00 packs/day for 25 years  . Smokeless tobacco: Never Used  . Alcohol Use: No    Review of Systems  Constitutional: Positive for fatigue. Negative for fever and chills.  HENT: Negative for congestion.   Eyes: Negative for visual disturbance.  Respiratory: Negative for shortness of breath.   Cardiovascular: Positive for chest pain.  Gastrointestinal: Positive for nausea, vomiting, abdominal pain and hematochezia.  Genitourinary: Negative for dysuria and flank pain.  Musculoskeletal: Negative for back pain, neck pain and neck stiffness.  Skin: Negative for rash.  Neurological: Negative for light-headedness and headaches.      Allergies  Review of patient's allergies indicates no known allergies.  Home Medications   Prior to Admission medications   Medication Sig Start Date End Date Taking? Authorizing Provider  aspirin 325 MG tablet Take 325 mg by mouth 4 (four) times daily as needed for mild pain.    Yes Historical Provider, MD  atenolol-chlorthalidone (TENORETIC) 50-25 MG per tablet Take 1 tablet by mouth daily.   Yes Historical Provider, MD  cloNIDine (CATAPRES) 0.1 MG tablet Take 1 tablet (0.1 mg total) by mouth every morning. 11/09/13  Yes Dorena Cookey, MD  fluticasone [facility]) 50 MCG/ACT nasal spray Place 2 sprays into both nostrils at bedtime. 11/09/13  Yes Dorena Cookey, MD  gabapentin (NEURONTIN) 400 MG capsule Take 400 mg by mouth every evening.  08/03/14  Yes Historical Provider, MD  loratadine (CLARITIN) 10 MG tablet Take  1 tablet (10 mg total) by mouth daily. 04/12/13  Yes Monika Salk, MD  Multiple Vitamin (MULITIVITAMIN WITH MINERALS) TABS Take 1 tablet by mouth daily.   Yes Historical Provider, MD  oxyCODONE-acetaminophen (PERCOCET) 10-325 MG per tablet Take 1 tablet by mouth every 8 (eight) hours as needed for pain.  08/14/14  Yes Historical Provider, MD  sildenafil (VIAGRA) 100 MG tablet Take 100 mg by mouth daily as needed for erectile dysfunction.   Yes  Historical Provider, MD  atenolol-chlorthalidone (TENORETIC) 50-25 MG per tablet TAKE 1 BY MOUTH EVERY MORNING Patient not taking: Reported on 08/29/2014 01/13/14   Dorena Cookey, MD  VIAGRA 100 MG tablet TAKE 1 TABLET (100 MG TOTAL) BY MOUTH DAILY AS NEEDED. Patient not taking: Reported on 08/29/2014 12/04/13   Dorena Cookey, MD   BP 130/84 mmHg  Pulse 101  Temp(Src) 99.1 F (37.3 C) (Oral)  Resp 17  Ht 6' (1.829 m)  Wt 260 lb (117.935 kg)  BMI 35.25 kg/m2  SpO2 100% Physical Exam  Constitutional: He is oriented to person, place, and time. He appears well-developed and well-nourished.  HENT:  Head: Normocephalic and atraumatic.  Eyes: Conjunctivae are normal. Right eye exhibits no discharge. Left eye exhibits no discharge.  Neck: Normal range of motion. Neck supple. No tracheal deviation present.  Cardiovascular: Normal rate and regular rhythm.   Pulmonary/Chest: Effort normal and breath sounds normal.  Abdominal: Soft. He exhibits no distension. There is no tenderness. There is no guarding.  Genitourinary:  Melena   Musculoskeletal: He exhibits no edema.  Neurological: He is alert and oriented to person, place, and time.  Skin: Skin is warm. No rash noted.  Psychiatric: He has a normal mood and affect.  Nursing note and vitals reviewed.   ED Course  Procedures (including critical care time) CRITICAL CARE Performed by: Mariea Clonts   Total critical care time: 35 min  Critical care time was exclusive of separately billable procedures and treating other patients.  Critical care was necessary to treat or prevent imminent or life-threatening deterioration.  Critical care was time spent personally by me on the following activities: development of treatment plan with patient and/or surrogate as well as nursing, discussions with consultants, evaluation of patient's response to treatment, examination of patient, obtaining history from patient or surrogate, ordering and  performing treatments and interventions, ordering and review of laboratory studies, ordering and review of radiographic studies, pulse oximetry and re-evaluation of patient's condition.  Labs Review Labs Reviewed  MRSA PCR SCREENING - Abnormal; Notable for the following:    MRSA by PCR POSITIVE (*)    All other components within normal limits  CBC - Abnormal; Notable for the following:    RBC 2.44 (*)    Hemoglobin 7.9 (*)    HCT 22.9 (*)    All other components within normal limits  BASIC METABOLIC PANEL - Abnormal; Notable for the following:    Potassium 3.5 (*)    Glucose, Bld 124 (*)    GFR calc non Af Amer 88 (*)    All other components within normal limits  LACTIC ACID, PLASMA - Abnormal; Notable for the following:    Lactic Acid, Venous 2.6 (*)    All other components within normal limits  POC OCCULT BLOOD, ED - Abnormal; Notable for the following:    Fecal Occult Bld POSITIVE (*)    All other components within normal limits  HEMOGLOBIN  HEMOGLOBIN  HEMATOCRIT  HEMATOCRIT  BASIC METABOLIC  PANEL  HEMOGLOBIN  HEMOGLOBIN  HEMATOCRIT  HEMATOCRIT  I-STAT TROPOININ, ED  TYPE AND SCREEN  PREPARE RBC (CROSSMATCH)  ABO/RH  SURGICAL PATHOLOGY    Imaging Review Dg Chest 2 View  08/29/2014   CLINICAL DATA:  Woke with lower right-sided chest pain yesterday, since has had nausea, minimal pain, vomited bright red blood yesterday with dark red blood in stool yesterday and today, now with weakness, fatigue. Takes aspirin and Percocet. History hypertension, former smoker  EXAM: CHEST  2 VIEW  COMPARISON:  None  FINDINGS: Minimal enlargement of cardiac silhouette.  Mediastinal contours and pulmonary vascularity normal for the degree of dextro convex scoliosis.  Lungs clear.  No pleural effusion or pneumothorax.  Bones unremarkable.  IMPRESSION: No acute abnormalities.  Minimal enlargement of cardiac silhouette.   Electronically Signed   By: Lavonia Dana M.D.   On: 08/29/2014 15:58      EKG Interpretation   Date/Time:  Sunday August 29 2014 14:21:25 EST Ventricular Rate:  90 PR Interval:  182 QRS Duration: 95 QT Interval:  354 QTC Calculation: 433 R Axis:   -43 Text Interpretation:  Sinus rhythm Left anterior fascicular block LVH with  secondary repolarization abnormality Anterior Q waves, possibly due to LVH  Confirmed by Yusra Ravert  MD, Alfie Rideaux ([id number]) on 08/29/2014 11:52:52 PM      MDM   Final diagnoses:  Epigastric pain  Acute GI bleeding   Acute GI bleed with clinical concern for ulcer likely gastric with presentation, other differentials considered. Patient has no abdominal pain currently, vitals unremarkable. Hemoglobin dropped 5 points since last hemoglobin. 2 units of blood ordered, one to be given. Discussed with GI doctor Dr. Maurene Capes who will see the patient, discussed with Dr. Rockne Menghini who will admit the patient to step down.  The patients results and plan were reviewed and discussed.   Any x-rays performed were personally reviewed by myself.   Differential diagnosis were considered with the presenting HPI.  Medications  loratadine (CLARITIN) tablet 10 mg (not administered)  gabapentin (NEURONTIN) capsule 400 mg (not administered)  fluticasone (FLONASE) 50 MCG/ACT nasal spray 2 spray (2 sprays Each Nare Given 08/29/14 2229)  multivitamin with minerals tablet 1 tablet (not administered)  sodium chloride 0.9 % injection 3 mL (3 mLs Intravenous Given 08/29/14 2200)  0.9 % NaCl with KCl 20 mEq/ L  infusion ( Intravenous New Bag/Given 08/29/14 2234)  acetaminophen (TYLENOL) tablet 650 mg (not administered)    Or  acetaminophen (TYLENOL) suppository 650 mg (not administered)  morphine 4 MG/ML injection 4 mg (not administered)  ondansetron (ZOFRAN) tablet 4 mg (not administered)    Or  ondansetron (ZOFRAN) injection 4 mg (not administered)  pantoprazole (PROTONIX) injection 40 mg (40 mg Intravenous Given 08/29/14 2229)  0.9 %  sodium chloride infusion (not  administered)  oxyCODONE-acetaminophen (PERCOCET/ROXICET) 5-325 MG per tablet 1 tablet (not administered)    And  oxyCODONE (Oxy IR/ROXICODONE) immediate release tablet 5 mg (5 mg Oral Given 08/29/14 2311)  mupirocin ointment (BACTROBAN) 2 % (not administered)  Chlorhexidine Gluconate Cloth 2 % PADS 6 each (not administered)  sodium chloride 0.9 % bolus 500 mL (500 mLs Intravenous New Bag/Given 08/29/14 1547)    Filed Vitals:   08/29/14 2009 08/29/14 2054 08/29/14 2250 08/29/14 2329  BP: 109/92 149/79 162/81 130/84  Pulse: 107 96 101 101  Temp: 99 F (37.2 C) 98.8 F (37.1 C) 99.6 F (37.6 C) 99.1 F (37.3 C)  TempSrc: Oral Oral Oral Oral  Resp: 28 18  16 17  Height:      Weight:      SpO2: 100% 99% 100% 100%    Final diagnoses:  Epigastric pain  Acute GI bleeding   Blood loss anemia, acute  Admission/ observation were discussed with the admitting physician, patient and/or family and they are comfortable with the plan.      Mariea Clonts, MD 08/29/14 [phone]

## 2014-08-29 NOTE — ED Notes (Signed)
Pt in XRAY 

## 2014-08-29 NOTE — Consult Note (Signed)
   Consultation   History of Present Illness:  This is a 63 yo AA male  aske to see for UGIB. Pt vomited dark material x1in  last 24 hours and passed  dark stool 12 hours ago. Keith [name] has been having  chronic back pain , for which  He takes  narcotics ,supplementing  them with 325 mg ASA  about 4/day. Takes TUMS for heartburn. Denies hx of PUD.  Colonoscopy about 6 years ago , I cannot find documentation of it. says he is due for another one. Pt denies abd. pain,,dysphagia. No significant alcohol intake.    Past Medical History  Diagnosis Date  . Hypertension   . Obesity   . ED (erectile dysfunction)   . Glucose intolerance (impaired glucose tolerance)   . Osteoarthritis   . Allergy   . Spinal stenosis of lumbar region   . Urinary tract infection 04/11/2013  . Hypokalemia 04/11/2013  . Chronic back pain    Past Surgical History  Procedure Laterality Date  . Tonsillectomy    . Lumbar laminectomy    . Nasal polyp surgery      reports that he has quit smoking. He has never used smokeless tobacco. He reports that he does not drink alcohol or use illicit drugs. family history includes Asthma in his mother; Diabetes in his mother; Hypertension in his father; Stroke in his father. No Known Allergies      Review of Systems:  The remainder of the 10 point ROS is negative except as outlined in H&P   Physical Exam: General appearance  Well developed, in no distress. Eyes- non icteric. HEENT nontraumatic, normocephalic. Mouth no lesions, tongue papillated, no cheilosis. Neck supple without adenopathy, thyroid not enlarged, no carotid bruits, no JVD. Lungs Clear to auscultation bilaterally. Cor normal S1, normal S2, regular rhythm, no murmur,  quiet precordium. Abdomen: mildly protuberant but soft and non tender, normal bowl sounds Rectal:not done Extremities no pedal edema. Skin no lesions. Neurological alert and oriented x 3. Psychological normal mood and affect.  Assessment  and Plan:  63 yo AA male with acute UGIB, likely ASA associated gastropathy, possible gastric  ulcer, or esophageal ulcer. At the moment he is stable but has had a signicificant drop in H/H from 13.4 to 7.9. I have discussed the evaluation with Dr Rockne Menghini and with the patient and we will proceed with EGD tonight PPI IV Bowl rest, further recommendations pending EGD results Pt will have to stop taking ASA/NSAIDS ?chronic long term PPI indicated   08/29/2014 Delfin Edis

## 2014-08-29 NOTE — ED Notes (Signed)
Patient transported to X-ray 

## 2014-08-29 NOTE — ED Notes (Addendum)
Pt states yesterday woke up with right sided chest pain that turn into nausea, Vomited bright red blood yesterday along with dark red blood in stool. Pt states same happened today and now pt feeling weak and fatigue. Pt states he has been taking 3-4 bayers aspirin a day x 1 year along with 3 percocet along with other tylenol.

## 2014-08-29 NOTE — H&P (Signed)
History and Physical:    Keith [name]:[id number] DOB: December 10, 1950 DOA: 08/29/2014  Referring physician: Dr. Reather Converse PCP: Joycelyn Man, MD   Chief Complaint: Hematemesis  History of Present Illness:   Keith [name] is an 63 y.o. male with a PMH of spinal stenosis/chronic back pain which he has been treating with ASA and norco, HTN, obesity who presents with a 24 hour history of bloody/black stools.  He tells me he has been taking up to 4 aspirins a day as well as acetaminophen, and Percocet to control his back pain.  He has noticed slight abdominal pain x 2 weeks, lower abdomen, associated with some intermittent nausea and one episode of vomiting 08/28/14.  Emesis was bloody.  Had a colonoscopy 5 years ago.  He endorses fatigue, weakness, presyncope with position changes.  Upon initial evaluation in the ED, the patient was noted to have heme + stool on rectal exam and a hemoglobin of 7.9 mg/dL.  GI has been consulted with plans to take for emergent EGD.  ROS:   Constitutional: No fever, no chills;  Appetite diminished with early satiety; No weight loss, no weight gain, + fatigue.  HEENT: No blurry vision, no diplopia, no pharyngitis, no dysphagia CV: No chest pain, no palpitations, no PND, no orthopnea, no edema.  Resp: No SOB, no cough, no pleuritic pain. GI: + nausea, + vomiting, no diarrhea, no melena, + hematochezia, no constipation, + abdominal pain.  GU: No dysuria, no hematuria, no frequency, no urgency. MSK: no myalgias, no arthralgias.  Neuro:  No headache, no focal neurological deficits, no history of seizures.  Psych: No depression, no anxiety.  Endo: No heat intolerance, no cold intolerance, no polyuria, no polydipsia  Skin: No rashes, no skin lesions.  Heme: No easy bruising.  Travel history: No recent travel.   Past Medical History:   Past Medical History  Diagnosis Date  . Hypertension   . Obesity   . ED (erectile dysfunction)   . Glucose intolerance  (impaired glucose tolerance)   . Osteoarthritis   . Allergy   . Spinal stenosis of lumbar region   . Urinary tract infection 04/11/2013  . Hypokalemia 04/11/2013  . Chronic back pain     Past Surgical History:   Past Surgical History  Procedure Laterality Date  . Tonsillectomy    . Lumbar laminectomy    . Nasal polyp surgery      Social History:   History   Social History  . Marital Status: Married    Spouse Name: N/A    Number of Children: N/A  . Years of Education: N/A   Occupational History  . Not on file.   Social History Main Topics  . Smoking status: Former Smoker -- 1.00 packs/day for 25 years  . Smokeless tobacco: Never Used  . Alcohol Use: No  . Drug Use: No  . Sexual Activity: Yes    Birth Control/ Protection: None   Other Topics Concern  . Not on file   Social History Narrative   Married.      Family history:   Family History  Problem Relation Age of Onset  . Diabetes Mother   . Asthma Mother   . Hypertension Father   . Stroke Father     Allergies   Review of patient's allergies indicates no known allergies.  Current Medications:   Prior to Admission medications   Medication Sig Start Date End Date Taking? Authorizing Provider  aspirin 325 MG tablet  Take 325 mg by mouth 4 (four) times daily as needed for mild pain.    Yes Historical Provider, MD  atenolol-chlorthalidone (TENORETIC) 50-25 MG per tablet Take 1 tablet by mouth daily.   Yes Historical Provider, MD  cloNIDine (CATAPRES) 0.1 MG tablet Take 1 tablet (0.1 mg total) by mouth every morning. 11/09/13  Yes Dorena Cookey, MD  fluticasone [facility]) 50 MCG/ACT nasal spray Place 2 sprays into both nostrils at bedtime. 11/09/13  Yes Dorena Cookey, MD  gabapentin (NEURONTIN) 400 MG capsule Take 400 mg by mouth every evening.  08/03/14  Yes Historical Provider, MD  loratadine (CLARITIN) 10 MG tablet Take 1 tablet (10 mg total) by mouth daily. 04/12/13  Yes Monika Salk, MD  Multiple Vitamin  (MULITIVITAMIN WITH MINERALS) TABS Take 1 tablet by mouth daily.   Yes Historical Provider, MD  oxyCODONE-acetaminophen (PERCOCET) 10-325 MG per tablet Take 1 tablet by mouth every 8 (eight) hours as needed for pain.  08/14/14  Yes Historical Provider, MD  sildenafil (VIAGRA) 100 MG tablet Take 100 mg by mouth daily as needed for erectile dysfunction.   Yes Historical Provider, MD  atenolol-chlorthalidone (TENORETIC) 50-25 MG per tablet TAKE 1 BY MOUTH EVERY MORNING Patient not taking: Reported on 08/29/2014 01/13/14   Dorena Cookey, MD  VIAGRA 100 MG tablet TAKE 1 TABLET (100 MG TOTAL) BY MOUTH DAILY AS NEEDED. Patient not taking: Reported on 08/29/2014 12/04/13   Dorena Cookey, MD    Physical Exam:   Filed Vitals:   08/29/14 1423 08/29/14 1621  BP: 120/77 126/82  Pulse: 95 82  Temp: 97.9 F (36.6 C) 99.1 F (37.3 C)  TempSrc: Oral Oral  Resp: 20 20  SpO2: 99% 100%     Physical Exam: Blood pressure 126/82, pulse 82, temperature 99.1 F (37.3 C), temperature source Oral, resp. rate 20, SpO2 100 %. Gen: No acute distress. Head: Normocephalic, atraumatic. Eyes: PERRL, EOMI, sclerae nonicteric. Mouth: Oropharynx clear. Neck: Supple, no thyromegaly, no lymphadenopathy, no jugular venous distention. Chest: Lungs CTAB. CV: Heart sounds are regular, no M/R/G. Abdomen: Soft, nontender, nondistended with normal active bowel sounds. Extremities: Extremities are without C/E/C. Skin: Warm and dry. Neuro: Alert and oriented times 3; cranial nerves II through XII grossly intact. Psych: Mood and affect normal.   Data Review:    Labs: Basic Metabolic Panel:  Recent Labs Lab 08/29/14 1529  NA 143  K 3.5*  CL 102  CO2 27  GLUCOSE 124*  BUN 23  CREATININE 0.94  CALCIUM 9.4   CBC:  Recent Labs Lab 08/29/14 1529  WBC 8.6  HGB 7.9*  HCT 22.9*  MCV 93.9  PLT 275    Radiographic Studies: Dg Chest 2 View  08/29/2014   CLINICAL DATA:  Woke with lower right-sided chest  pain yesterday, since has had nausea, minimal pain, vomited bright red blood yesterday with dark red blood in stool yesterday and today, now with weakness, fatigue. Takes aspirin and Percocet. History hypertension, former smoker  EXAM: CHEST  2 VIEW  COMPARISON:  None  FINDINGS: Minimal enlargement of cardiac silhouette.  Mediastinal contours and pulmonary vascularity normal for the degree of dextro convex scoliosis.  Lungs clear.  No pleural effusion or pneumothorax.  Bones unremarkable.  IMPRESSION: No acute abnormalities.  Minimal enlargement of cardiac silhouette.   Electronically Signed   By: Lavonia Dana M.D.   On: 08/29/2014 15:58    EKG: Independently reviewed. Sinus rhythm at 90 bpm.  Left anterior fascicular block.  LVH with secondary repolarization abnormality Anterior Q waves, possibly due to LVH.   Assessment/Plan:   Principal Problem:   Acute upper GI bleed / acute blood loss anemia  Likely from ASA induced PUD.  Protonix 40 mg Q 12 hours.  Hold ASA.  GI consultation for emergent EGD.  Give 1 unit of PRBCs.  Check H/H Q 6 hours.  Active Problems:   Essential hypertension  Hold anti-hypertensives for now.    Spinal stenosis of lumbar region  Continue Percocet/neurontin.  Morphine PRN.    Hypokalemia  Potassium added to IVF.    DVT prophylaxis  SCDs ordered.  Code Status: Full. Family Communication: Rebekah Sprinkle (wife): [phone]. Disposition Plan: Home when stable.  Time spent: 1 hour.  RAMA,CHRISTINA [facility] Pager [phone] Cell: [phone]   If 7PM-7AM, please contact night-coverage [email or web address] Password [email or web address] 08/29/2014, 5:10 PM

## 2014-08-30 ENCOUNTER — Encounter (HOSPITAL_COMMUNITY): Payer: Self-pay | Admitting: Internal Medicine

## 2014-08-30 DIAGNOSIS — K449 Diaphragmatic hernia without obstruction or gangrene: Secondary | ICD-10-CM

## 2014-08-30 DIAGNOSIS — K222 Esophageal obstruction: Secondary | ICD-10-CM

## 2014-08-30 DIAGNOSIS — Z22322 Carrier or suspected carrier of Methicillin resistant Staphylococcus aureus: Secondary | ICD-10-CM

## 2014-08-30 HISTORY — DX: Carrier or suspected carrier of methicillin resistant Staphylococcus aureus: Z22.322

## 2014-08-30 HISTORY — DX: Diaphragmatic hernia without obstruction or gangrene: K44.9

## 2014-08-30 HISTORY — DX: Esophageal obstruction: K22.2

## 2014-08-30 LAB — BASIC METABOLIC PANEL
Anion gap: 13 (ref 5–15)
BUN: 15 mg/dL (ref 6–23)
CO2: 27 mEq/L (ref 19–32)
Calcium: 9 mg/dL (ref 8.4–10.5)
Chloride: 101 mEq/L (ref 96–112)
Creatinine, Ser: 0.89 mg/dL (ref 0.50–1.35)
GFR calc Af Amer: >90 mL/min (ref >90)
GFR calc non Af Amer: 90 mL/min — ABNORMAL LOW (ref >90)
Glucose, Bld: 124 mg/dL — ABNORMAL HIGH (ref 70–99)
Potassium: 2.7 mEq/L — CL (ref 3.7–5.3)
Sodium: 141 mEq/L (ref 137–147)

## 2014-08-30 LAB — HEMOGLOBIN
Hemoglobin: 8.7 g/dL — ABNORMAL LOW (ref 13.0–17.0)
Hemoglobin: 7.9 g/dL — ABNORMAL LOW (ref 13.0–17.0)
Hemoglobin: 8.4 g/dL — ABNORMAL LOW (ref 13.0–17.0)

## 2014-08-30 LAB — MAGNESIUM: Magnesium: 1.9 mg/dL (ref 1.5–2.5)

## 2014-08-30 LAB — HEMATOCRIT
HCT: 25.1 % — ABNORMAL LOW (ref 39.0–52.0)
HCT: 23 % — ABNORMAL LOW (ref 39.0–52.0)
HCT: 24.7 % — ABNORMAL LOW (ref 39.0–52.0)

## 2014-08-30 MED ORDER — ATENOLOL 50 MG PO TABS
50.0000 mg | ORAL_TABLET | Freq: Every day | ORAL | Status: DC
  Administered 2014-08-30 – 2014-08-31 (×2): 50 mg via ORAL
  Filled 2014-08-30 (×2): qty 1

## 2014-08-30 MED ORDER — POTASSIUM CHLORIDE 10 MEQ/100ML IV SOLN
10.0000 meq | INTRAVENOUS | Status: AC
  Administered 2014-08-30 (×4): 10 meq via INTRAVENOUS
  Filled 2014-08-30 (×4): qty 100

## 2014-08-30 MED ORDER — CHLORTHALIDONE 25 MG PO TABS
25.0000 mg | ORAL_TABLET | Freq: Every day | ORAL | Status: DC
  Administered 2014-08-30 – 2014-08-31 (×2): 25 mg via ORAL
  Filled 2014-08-30 (×2): qty 1

## 2014-08-30 MED ORDER — CLONIDINE HCL 0.1 MG PO TABS
0.1000 mg | ORAL_TABLET | Freq: Every morning | ORAL | Status: DC
  Administered 2014-08-30 – 2014-08-31 (×2): 0.1 mg via ORAL
  Filled 2014-08-30 (×2): qty 1

## 2014-08-30 MED ORDER — POTASSIUM CHLORIDE CRYS ER 20 MEQ PO TBCR
40.0000 meq | EXTENDED_RELEASE_TABLET | Freq: Two times a day (BID) | ORAL | Status: AC
  Administered 2014-08-30 (×2): 40 meq via ORAL
  Filled 2014-08-30 (×2): qty 2

## 2014-08-30 MED ORDER — ATENOLOL-CHLORTHALIDONE 50-25 MG PO TABS: 1.0000 | ORAL_TABLET | Freq: Every day | ORAL | Status: DC

## 2014-08-30 MED ORDER — FERROUS SULFATE 325 (65 FE) MG PO TABS
325.0000 mg | ORAL_TABLET | Freq: Two times a day (BID) | ORAL | Status: DC
  Administered 2014-08-30 – 2014-08-31 (×4): 325 mg via ORAL
  Filled 2014-08-30 (×5): qty 1

## 2014-08-30 NOTE — Plan of Care (Signed)
Problem: Phase I Progression Outcomes Goal: Pain controlled with appropriate interventions Outcome: Progressing Pain managed with Oxycodone 5mg  PRN (as ordered.) Goal: OOB as tolerated unless otherwise ordered Outcome: Progressing

## 2014-08-30 NOTE — Plan of Care (Signed)
Problem: Consults Goal: GI Bleeding Patient Education See Patient Education Module for education specifics. Outcome: Completed/Met Date Met:  08/30/14 Goal: Skin Care Protocol Initiated - if Braden Score 18 or less If consults are not indicated, leave blank or document N/A Outcome: Completed/Met Date Met:  08/30/14 Goal: Nutrition Consult-if indicated Outcome: Not Applicable Date Met:  08/30/14 Goal: Diabetes Guidelines if Diabetic/Glucose > 140 If diabetic or lab glucose is > 140 mg/dl - Initiate Diabetes/Hyperglycemia Guidelines & Document Interventions  Outcome: Not Applicable Date Met:  08/30/14  Problem: Phase I Progression Outcomes Goal: Pain controlled with appropriate interventions Outcome: Progressing Goal: OOB as tolerated unless otherwise ordered Outcome: Progressing Goal: Initial discharge plan identified Outcome: Completed/Met Date Met:  08/30/14 Goal: Voiding-avoid urinary catheter unless indicated Outcome: Completed/Met Date Met:  08/30/14 Goal: Hemodynamically stable Outcome: Completed/Met Date Met:  08/30/14     

## 2014-08-30 NOTE — Progress Notes (Signed)
Progress Note   HODARI CHUBA ZOX:[id number] DOB: [birth year] DOA: 08/29/2014 PCP: Joycelyn Man, MD   Brief Narrative:   Keith [name] is an 63 y.o. male with a PMH of spinal stenosis/chronic back pain which he has been treating with ASA and norco, HTN, obesity who was admitted 08/29/14 with a 24 hour history of bloody/black stools. He was taken emergently for an EGD which showed multiple duodenal ulcerations.  Assessment/Plan:   Principal Problem:   Acute upper GI bleed secondary to duodenal ulcer hemorrhage / acute blood loss anemia  Emergent EGD done 08/29/14.  Received 1 unit of PRBCs on admission.  Continue to monitor H/H.  Avoid aspirin/NSAIDs.  Follow-up biopsies to rule out H. Pylori.  Will need long-term acid suppressing therapy, currently on twice a day PPI.  Start iron supplementation.  Clear liquid diet.  Active Problems:   MRSA carrier  Decontamination therapy ordered.    Hiatal hernia/esophageal stricture  Continue PPI therapy.    Essential hypertension  Resume antihypertensives.    Spinal stenosis of lumbar region  Continue Percocet/Neurontin. Continue morphine as needed for breakthrough pain.    Hypokalemia  Check magnesium.  We'll start on oral supplementation therapy.    DVT Prophylaxis  SCDs ordered.  Code Status: Full. Family Communication: No family at the bedside. Disposition Plan: Home when stable.   IV Access:    Peripheral IV   Procedures and diagnostic studies:   Dg Chest 2 View 08/29/2014: No acute abnormalities.  Minimal enlargement of cardiac silhouette.     EGD 08/29/14: Multiple duodenal bulb and duodenal outlet ulcerations with slow bleeding. Hemostasis achieved with multiple injections of epinephrine. Nonobstructing distal esophageal stricture, not dilated. 2 cm reducible hiatal hernia. Biopsy from the gastric antrum to rule out H. pylori.  Medical Consultants:    Dr. Delfin Edis,  GI.  Anti-Infectives:    None.  Subjective:   Keith [name] is without complaint this morning.  No black/bloody stools over night.  No N/V.  Still feels weak.  Objective:    Filed Vitals:   08/30/14 0900 08/30/14 1000 08/30/14 1100 08/30/14 1218  BP: 143/54 110/88 121/69 105/78  Pulse: 78 93  88  Temp:    98.8 F (37.1 C)  TempSrc:    Oral  Resp: 10 12 19 17   Height:      Weight:      SpO2: 100% 100% 100% 100%    Intake/Output Summary (Last 24 hours) at 08/30/14 1425 Last data filed at 08/30/14 1000  Gross per 24 hour  Intake 2206.33 ml  Output   1625 ml  Net 581.33 ml    Exam: Gen:  NAD Cardiovascular:  RRR, No M/R/G Respiratory:  Lungs CTAB Gastrointestinal:  Abdomen soft, NT/ND, + BS Extremities:  No C/E/C   Data Reviewed:    Labs: Basic Metabolic Panel:  Recent Labs Lab 08/29/14 1529 08/30/14 0144 08/30/14 0600  NA 143 141  --   K 3.5* 2.7*  --   CL 102 101  --   CO2 27 27  --   GLUCOSE 124* 124*  --   BUN 23 15  --   CREATININE 0.94 0.89  --   CALCIUM 9.4 9.0  --   MG  --   --  1.9   GFR Estimated Creatinine Clearance: 114.8 mL/min (by C-G formula based on Cr of 0.89).  CBC:  Recent Labs Lab 08/29/14 1529 08/30/14 0130 08/30/14 0600 08/30/14 1145  WBC 8.6  --   --   --  HGB 7.9* 8.7* 7.9* 8.4*  HCT 22.9* 25.1* 23.0* 24.7*  MCV 93.9  --   --   --   PLT 275  --   --   --    Sepsis Labs:  Recent Labs Lab 08/29/14 1529 08/29/14 1530  WBC 8.6  --   LATICACIDVEN  --  2.6*   Microbiology Recent Results (from the past 240 hour(s))  MRSA PCR Screening     Status: Abnormal   Collection Time: 08/29/14  5:46 PM  Result Value Ref Range Status   MRSA by PCR POSITIVE (A) NEGATIVE Final    Comment:        The GeneXpert MRSA Assay (FDA approved for NASAL specimens only), is one component of a comprehensive MRSA colonization surveillance program. It is not intended to diagnose MRSA infection nor to guide or monitor  treatment for MRSA infections. RESULT CALLED TO, READ BACK BY AND VERIFIED WITH: T.BENJAMIN,RN AT 2146 ON 08/29/14 BY [email or web address]      Medications:   . sodium chloride   Intravenous Once  . atenolol-chlorthalidone  1 tablet Oral Daily  . Chlorhexidine Gluconate Cloth  6 each Topical Q0600  . cloNIDine  0.1 mg Oral q morning - 10a  . ferrous sulfate  325 mg Oral BID WC  . fluticasone  2 spray Each Nare QHS  . gabapentin  400 mg Oral QPM  . loratadine  10 mg Oral Daily  . multivitamin with minerals  1 tablet Oral Daily  . mupirocin ointment   Nasal BID  . pantoprazole (PROTONIX) IV  40 mg Intravenous Q12H  . potassium chloride  40 mEq Oral BID  . sodium chloride  3 mL Intravenous Q12H   Continuous Infusions: . 0.9 % NaCl with KCl 20 mEq / L 100 mL/hr at 08/30/14 0845    Time spent: 35 minutes with > 50% of time discussing current diagnostic test results, clinical impression and plan of care.    LOS: 1 day   Jolanda Mccann  [facility] Pager [phone]. If unable to reach me by pager, please call my cell phone at [phone].  *Please refer to [email or web address], password [email or web address] to get updated schedule on who will round on this patient, as hospitalists switch teams weekly. If 7PM-7AM, please contact night-coverage at [email or web address], password [email or web address] for any overnight needs.  08/30/2014, 2:25 PM    Information printed out and reviewed with the patient/family:     In an effort to keep you and your family informed about your hospital stay, I am providing you with this information sheet. If you or your family have any questions, please do not hesitate to have the nursing staff page me to set up a meeting time.  Also note that the hospitalist doctors typically change on Tuesdays or Wednesdays to a different hospitalist doctor.  JERRED ZAREMBA 08/30/2014 1 (Number of days in the hospital)  Treatment team:  Dr. Jacquelynn Cree, Hospitalist (Internist)  Dr. Delfin Edis,  Gastroenterologist  Pertinent labs / studies:  Your endoscopy showed multiple duodenal ulcers with bleeding.  Your hemoglobin today is 7.9.   Principle Diagnosis:  Bleeding duodenal ulcers with resultant anemia   Plan for today: 1. Continue acid suppression therapy. 2. Start iron supplementation. 3. Continue to monitor blood counts and transfuse if hemoglobin drops further. 4. Await biopsy results to determine if you have a bacterial infection that may have contributed to the ulcers.   Anticipated discharge date: Possibly 08/31/14 if blood counts remained stable.

## 2014-08-30 NOTE — Progress Notes (Signed)
CRITICAL VALUE ALERT  Critical value received: MRSA positive   Date of notification:  08/29/14  Time of notification:  2200  Critical value read back: yes   Nurse who received alert: Bisma Klett RN   MD notified (1st page):  Yes   Time of first page:  2205  MD notified (2nd page):  Time of second page:  Responding MD: Harless Litten  Time MD responded:  2212

## 2014-08-30 NOTE — Progress Notes (Signed)
[facility] Progress Note  Subjective:  Hgb 7.9 this morning.   K= was 2.7, replacements ordered.EGd  Yesterday with multiple ulcers. NO vomiting. NO further dark stools.   Objective:  Vital signs in last 24 hours: Temp:  [97.9 F (36.6 C)-99.6 F (37.6 C)] 98.5 F (36.9 C) (11/23 0400) Pulse Rate:  [80-127] 95 (11/23 0700) Resp:  [11-28] 20 (11/23 0700) BP: (109-194)/(49-114) 112/62 mmHg (11/23 0700) SpO2:  [96 %-100 %] 99 % (11/23 0700) Weight:  [260 lb (117.935 kg)-263 lb 3.7 oz (119.4 kg)] 263 lb 3.7 oz (119.4 kg) (11/23 0500)   General:   Alert,  Well-developed,    in NAD Heart:  Regular rate and rhythm; no murmurs Pulm;lungs clear Abdomen:  Soft, nontender and nondistended. Normal bowel sounds, without guarding, and without rebound.   Extremities:  Without edema. Neurologic:  Alert and  oriented x4;  grossly normal neurologically. Psych:  Alert and cooperative. Normal mood and affect.  Intake/Output from previous day: 11/22 0701 - 11/23 0700 In: 1226.3 [I.V.:746.3; Blood:330; IV Piggyback:150] Out: 0998 [Urine:1125] Intake/Output this shift:    Lab Results:  Recent Labs  08/29/14 1529 08/30/14 0130 08/30/14 0600  WBC 8.6  --   --   HGB 7.9* 8.7* 7.9*  HCT 22.9* 25.1* 23.0*  PLT 275  --   --    BMET  Recent Labs  08/29/14 1529 08/30/14 0144  NA 143 141  K 3.5* 2.7*  CL 102 101  CO2 27 27  GLUCOSE 124* 124*  BUN 23 15  CREATININE 0.94 0.89  CALCIUM 9.4 9.0   Dg Chest 2 View  08/29/2014   CLINICAL DATA:  Woke with lower right-sided chest pain yesterday, since has had nausea, minimal pain, vomited bright red blood yesterday with dark red blood in stool yesterday and today, now with weakness, fatigue. Takes aspirin and Percocet. History hypertension, former smoker  EXAM: CHEST  2 VIEW  COMPARISON:  None  FINDINGS: Minimal enlargement of cardiac silhouette.  Mediastinal contours and pulmonary vascularity normal for the degree of dextro  convex scoliosis.  Lungs clear.  No pleural effusion or pneumothorax.  Bones unremarkable.  IMPRESSION: No acute abnormalities.  Minimal enlargement of cardiac silhouette.   Electronically Signed   By: Lavonia Dana M.D.   On: 08/29/2014 15:58  PROCEDURES: EGD 08/29/14: ENDOSCOPIC IMPRESSION: 1. multiple duodenal bulb and duodenal outlet ulcerations with slow bleeding. Hemostasis achieved with multiple injections of it epinephrine into the edges of the ulcers. Total of 5 cc 2. Nonobstructing distal esophageal stricture not dilated 3. 2 cm reducible hiatal hernia 4. Biopsy from the gastric antrum to rule out H. pylori RECOMMENDATIONS bowel rest tonight Follow hemoglobin and hematocrit through the night and next 24 hours aspirin has been discontinued continue Protonix IV Await biopsies to rule out H. pylori and treat appropriately if positive Discussed with the patient pain management for his low back pain Will most likely need oral iron supplements alone for 4-6 weeks long-term acid suppressing therapy  ASSESSMENT/PLAN:   63 yo male admitted with melena. EGD with multiple duodenal ulcers. H pylori pending. Would continue bid PPI and start po iron. Repeat  Cbc ordered for later today. Avoid  Aspirin and NSAIDS.     LOS: 1 day   Keith [name], Keith [name] 08/30/2014, Pager [phone]  GI Attending Note  I have personally taken an interval history, reviewed the chart, and examined the patient.  I agree with the extender's note, impression and recommendations.  May quickly advance diet  Sandy Salaam. Deatra Ina, MD, [facility] [phone]

## 2014-08-30 NOTE — Progress Notes (Signed)
CRITICAL VALUE ALERT  Critical value received:  Potassium 2.7   Date of notification:  08/30/14  Time of notification:  0230  Critical value read back: yes   Nurse who received alert:  Jonasia Coiner RN   MD notified (1st page):  Yes   Time of first page:  0235  MD notified (2nd page):   Time of second page:  Responding MD:  Harless Litten NP   Time MD responded:  [phone]

## 2014-08-31 ENCOUNTER — Other Ambulatory Visit: Payer: Self-pay | Admitting: Physician Assistant

## 2014-08-31 DIAGNOSIS — K222 Esophageal obstruction: Secondary | ICD-10-CM

## 2014-08-31 DIAGNOSIS — K449 Diaphragmatic hernia without obstruction or gangrene: Secondary | ICD-10-CM

## 2014-08-31 DIAGNOSIS — D62 Acute posthemorrhagic anemia: Secondary | ICD-10-CM

## 2014-08-31 LAB — BASIC METABOLIC PANEL
Anion gap: 11 (ref 5–15)
BUN: 8 mg/dL (ref 6–23)
CO2: 24 mEq/L (ref 19–32)
Calcium: 9.1 mg/dL (ref 8.4–10.5)
Chloride: 105 mEq/L (ref 96–112)
Creatinine, Ser: 0.9 mg/dL (ref 0.50–1.35)
GFR calc Af Amer: >90 mL/min (ref >90)
GFR calc non Af Amer: 89 mL/min — ABNORMAL LOW (ref >90)
Glucose, Bld: 108 mg/dL — ABNORMAL HIGH (ref 70–99)
Potassium: 3.8 mEq/L (ref 3.7–5.3)
Sodium: 140 mEq/L (ref 137–147)

## 2014-08-31 LAB — CBC
HCT: 23.3 % — ABNORMAL LOW (ref 39.0–52.0)
Hemoglobin: 7.7 g/dL — ABNORMAL LOW (ref 13.0–17.0)
MCH: 31.8 pg (ref 26.0–34.0)
MCHC: 33 g/dL (ref 30.0–36.0)
MCV: 96.3 fL (ref 78.0–100.0)
Platelets: 228 10*3/uL (ref 150–400)
RBC: 2.42 MIL/uL — ABNORMAL LOW (ref 4.22–5.81)
RDW: 15.1 % (ref 11.5–15.5)
WBC: 8.7 10*3/uL (ref 4.0–10.5)

## 2014-08-31 MED ORDER — FERROUS SULFATE 325 (65 FE) MG PO TABS: 325.0000 mg | ORAL_TABLET | Freq: Two times a day (BID) | ORAL | Status: DC

## 2014-08-31 MED ORDER — PANTOPRAZOLE SODIUM 40 MG PO TBEC: 40.0000 mg | DELAYED_RELEASE_TABLET | Freq: Two times a day (BID) | ORAL | Status: DC

## 2014-08-31 NOTE — Discharge Instructions (Signed)
Please go to [facility] on 09/24/14 and go to lab in basement to have blood work drawn. You have an appt to follow up with GI on 09/28/14 at 9:15 a.m.--please be there 15 minutes early, bring all meds. Peptic Ulcer A peptic ulcer is a sore in the lining of your esophagus (esophageal ulcer), stomach (gastric ulcer), or in the first part of your small intestine (duodenal ulcer). The ulcer causes erosion into the deeper tissue. CAUSES  Normally, the lining of the stomach and the small intestine protects itself from the acid that digests food. The protective lining can be damaged by:  An infection caused by a bacterium called Helicobacter pylori (H. pylori).  Regular use of nonsteroidal anti-inflammatory drugs (NSAIDs), such as ibuprofen or aspirin.  Smoking tobacco. Other risk factors include being older than 25, drinking alcohol excessively, and having a family history of ulcer disease.  SYMPTOMS   Burning pain or gnawing in the area between the chest and the belly button.  Heartburn.  Nausea and vomiting.  Bloating. The pain can be worse on an empty stomach and at night. If the ulcer results in bleeding, it can cause:  Black, tarry stools.  Vomiting of bright red blood.  Vomiting of coffee-ground-looking materials. DIAGNOSIS  A diagnosis is usually made based upon your history and an exam. Other tests and procedures may be performed to find the cause of the ulcer. Finding a cause will help determine the best treatment. Tests and procedures may include:  Blood tests, stool tests, or breath tests to check for the bacterium H. pylori.  An upper gastrointestinal (GI) series of the esophagus, stomach, and small intestine.  An endoscopy to examine the esophagus, stomach, and small intestine.  A biopsy. TREATMENT  Treatment may include:  Eliminating the cause of the ulcer, such as smoking, NSAIDs, or alcohol.  Medicines to reduce the amount of acid in your digestive  tract.  Antibiotic medicines if the ulcer is caused by the H. pylori bacterium.  An upper endoscopy to treat a bleeding ulcer.  Surgery if the bleeding is severe or if the ulcer created a hole somewhere in the digestive system. HOME CARE INSTRUCTIONS   Avoid tobacco, alcohol, and caffeine. Smoking can increase the acid in the stomach, and continued smoking will impair the healing of ulcers.  Avoid foods and drinks that seem to cause discomfort or aggravate your ulcer.  Only take medicines as directed by your caregiver. Do not substitute over-the-counter medicines for prescription medicines without talking to your caregiver.  Keep any follow-up appointments and tests as directed. SEEK MEDICAL CARE IF:   Your do not improve within 7 days of starting treatment.  You have ongoing indigestion or heartburn. SEEK IMMEDIATE MEDICAL CARE IF:   You have sudden, sharp, or persistent abdominal pain.  You have bloody or dark black, tarry stools.  You vomit blood or vomit that looks like coffee grounds.  You become light-headed, weak, or feel faint.  You become sweaty or clammy. MAKE SURE YOU:   Understand these instructions.  Will watch your condition.  Will get help right away if you are not doing well or get worse. Document Released: 09/21/2000 Document Revised: 02/08/2014 Document Reviewed: 04/23/2012 [facility] Patient Information 2015 [place], [place]. This information is not intended to replace advice given to you by your health care provider. Make sure you discuss any questions you have with your health care provider.

## 2014-08-31 NOTE — Plan of Care (Signed)
Problem: Discharge Progression Outcomes Goal: Barriers To Progression Addressed/Resolved Outcome: Completed/Met Date Met:  08/31/14 Goal: Discharge plan in place and appropriate Outcome: Completed/Met Date Met:  08/31/14 Goal: Pain controlled with appropriate interventions Outcome: Completed/Met Date Met:  08/31/14 Goal: Stools guaiac negative Outcome: Completed/Met Date Met:  08/31/14 Goal: Hemodynamically stable Outcome: Completed/Met Date Met:  08/31/14 Goal: Tolerating diet Outcome: Completed/Met Date Met:  08/31/14 Goal: Activity appropriate for discharge plan Outcome: Completed/Met Date Met:  08/31/14 Goal: [place] arrangements in place Outcome: Not Applicable Date Met:  21/62/44 Goal: Other Discharge Outcomes/Goals Outcome: Completed/Met Date Met:  08/31/14

## 2014-08-31 NOTE — Discharge Summary (Signed)
Physician Discharge Summary  Keith [name] ION:[id number] DOB: 09-12-1951 DOA: 08/29/2014  PCP: Keith Man, MD  Admit date: 08/29/2014 Discharge date: 08/31/2014   Recommendations for Outpatient Follow-Up:   1. Recommend F/U CBC in 2 weeks (at scheduled GI follow up).   Discharge Diagnosis:   Principal Problem:    Acute upper GI bleed secondary to duodenal ulcer hemorrhage Active Problems:    Essential hypertension    Spinal stenosis of lumbar region    Acute blood loss anemia    Hypokalemia    Duodenal ulcer hemorrhage    Hiatal hernia    Esophageal stricture    MRSA carrier   Discharge Condition: Improved.  Diet recommendation: Low sodium, heart healthy.   History of Present Illness:   Keith [name] is an 63 y.o. male with a PMH of spinal stenosis/chronic back pain which he has been treating with ASA and norco, HTN, obesity who was admitted 08/29/14 with a 24 hour history of bloody/black stools. He was taken emergently for an EGD which showed multiple duodenal ulcerations.   Hospital Course by Problem:   Principal Problem:  Acute upper GI bleed secondary to duodenal ulcer hemorrhage / acute blood loss anemia  Emergent EGD done 08/29/14.  Received 1 unit of PRBCs on admission. H&H stable.  Avoid aspirin/NSAIDs.  H. Pylori/malignancy ruled out by biopsy.  Will need long-term acid suppressing therapy, currently on twice a day PPI.  D/C home on iron supplementation.  Diet advanced prior to D/C.  Active Problems:  MRSA carrier  Decontamination therapy ordered.   Hiatal hernia/esophageal stricture  Continue PPI therapy.   Essential hypertension  Continue current antihypertensives.   Spinal stenosis of lumbar region  Continue Percocet/Neurontin.    Hypokalemia  Resolved with supplementation.    Medical Consultants:    Dr. Delfin [name], GI.   Discharge Exam:   Filed Vitals:   08/31/14 0503  BP:  102/57  Pulse: 76  Temp: 98.5 F (36.9 C)  Resp: 16   Filed Vitals:   08/30/14 1218 08/30/14 1815 08/30/14 2155 08/31/14 0503  BP: 105/78 122/76 98/67 102/57  Pulse: 88  69 76  Temp: 98.8 F (37.1 C)  98.6 F (37 C) 98.5 F (36.9 C)  TempSrc: Oral  Oral Oral  Resp: 17  16 16   Height:      Weight:      SpO2: 100%  100% 100%    Gen:  NAD Cardiovascular:  RRR, No M/R/G Respiratory: Lungs CTAB Gastrointestinal: Abdomen soft, NT/ND with normal active bowel sounds. Extremities: No C/E/C   The results of significant diagnostics from this hospitalization (including imaging, microbiology, ancillary and laboratory) are listed below for reference.     Procedures and Diagnostic Studies:   Dg Chest 2 View 08/29/2014: No acute abnormalities. Minimal enlargement of cardiac silhouette.   EGD 08/29/14: Multiple duodenal bulb and duodenal outlet ulcerations with slow bleeding. Hemostasis achieved with multiple injections of epinephrine. Nonobstructing distal esophageal stricture, not dilated. 2 cm reducible hiatal hernia. Biopsy from the gastric antrum to rule out H. pylori.   Labs:   Basic Metabolic Panel:  Recent Labs Lab 08/29/14 1529 08/30/14 0144 08/30/14 0600 08/31/14 0410  NA 143 141  --  140  K 3.5* 2.7*  --  3.8  CL 102 101  --  105  CO2 27 27  --  24  GLUCOSE 124* 124*  --  108*  BUN 23 15  --  8  CREATININE 0.94 0.89  --  0.90  CALCIUM 9.4 9.0  --  9.1  MG  --   --  1.9  --    GFR Estimated Creatinine Clearance: 113.5 mL/min (by C-G formula based on Cr of 0.9).  CBC:  Recent Labs Lab 08/29/14 1529 08/30/14 0130 08/30/14 0600 08/30/14 1145 08/31/14 0410  WBC 8.6  --   --   --  8.7  HGB 7.9* 8.7* 7.9* 8.4* 7.7*  HCT 22.9* 25.1* 23.0* 24.7* 23.3*  MCV 93.9  --   --   --  96.3  PLT 275  --   --   --  228   Microbiology Recent Results (from the past 240 hour(s))  MRSA PCR Screening     Status: Abnormal   Collection Time: 08/29/14  5:46 PM  Result  Value Ref Range Status   MRSA by PCR POSITIVE (A) NEGATIVE Final    Comment:        The GeneXpert MRSA Assay (FDA approved for NASAL specimens only), is one component of a comprehensive MRSA colonization surveillance program. It is not intended to diagnose MRSA infection nor to guide or monitor treatment for MRSA infections. RESULT CALLED TO, READ BACK BY AND VERIFIED WITH: T.BENJAMIN,RN AT 2146 ON 08/29/14 BY SHEA.W      Discharge Instructions:   Discharge Instructions    Call MD for:  extreme fatigue    Complete by:  As directed      Call MD for:    Complete by:  As directed   Dizziness, recurrent bloody stools.     Diet - low sodium heart healthy    Complete by:  As directed      Discharge instructions    Complete by:  As directed   You were cared for by Dr. Jacquelynn [name]  (a hospitalist) during your hospital stay. If you have any questions about your discharge medications or the care you received while you were in the hospital after you are discharged, you can call the unit and ask to speak with the hospitalist on call if the hospitalist that took care of you is not available. Once you are discharged, your primary care physician will handle any further medical issues. Please note that NO REFILLS for any discharge medications will be authorized once you are discharged, as it is imperative that you return to your primary care physician (or establish a relationship with a primary care physician if you do not have one) for your aftercare needs so that they can reassess your need for medications and monitor your lab values.  Any outstanding tests can be reviewed by your PCP at your follow up visit.  It is also important to review any medicine changes with your PCP.  Please bring these d/c instructions with you to your next visit so your physician can review these changes with you.     Increase activity slowly    Complete by:  As directed             Medication List    STOP  taking these medications        aspirin 325 MG tablet      TAKE these medications        atenolol-chlorthalidone 50-25 MG per tablet  Commonly known as:  TENORETIC  Take 1 tablet by mouth daily.     cloNIDine 0.1 MG tablet  Commonly known as:  CATAPRES  Take 1 tablet (0.1 mg total) by mouth every morning.     ferrous sulfate 325 (  65 FE) MG tablet  Take 1 tablet (325 mg total) by mouth 2 (two) times daily with a meal.     fluticasone 50 MCG/ACT nasal spray  Commonly known as:  FLONASE  Place 2 sprays into both nostrils at bedtime.     gabapentin 400 MG capsule  Commonly known as:  NEURONTIN  Take 400 mg by mouth every evening.     loratadine 10 MG tablet  Commonly known as:  CLARITIN  Take 1 tablet (10 mg total) by mouth daily.     multivitamin with minerals Tabs tablet  Take 1 tablet by mouth daily.     oxyCODONE-acetaminophen 10-325 MG per tablet  Commonly known as:  PERCOCET  Take 1 tablet by mouth every 8 (eight) hours as needed for pain.     pantoprazole 40 MG tablet  Commonly known as:  PROTONIX  Take 1 tablet (40 mg total) by mouth 2 (two) times daily.     sildenafil 100 MG tablet  Commonly known as:  VIAGRA  Take 100 mg by mouth daily as needed for erectile dysfunction.           Follow-up Information    Follow up with Hvozdovic, Vita Barley, PA-C On 09/28/2014.   Specialty:  Physician Assistant   Why:  9:15 a.m. be there 15 minutes early, bring all meds   Contact information:   [place] [address] [phone]       Follow up with TODD,JEFFREY ALLEN, MD. Schedule an appointment as soon as possible for a visit in 2 months.   Specialty:  Family Medicine   Why:  for your scheduled physical.   Contact information:   [place] [address] [phone]        Time coordinating discharge: 35 minutes.  Signed:  RAMA,CHRISTINA  Pager [phone] [facility] 08/31/2014, 1:43 PM

## 2014-08-31 NOTE — Progress Notes (Signed)
     [facility] Progress Note  Subjective:   Hgb 7.7 today.  Feels well. Had a normal colored BM today with a small amount  Old blood at the end. Hungry. Anxious to go home.   Objective:  Vital signs in last 24 hours: Temp:  [98.5 F (36.9 C)-98.8 F (37.1 C)] 98.5 F (36.9 C) (11/24 0503) Pulse Rate:  [69-93] 76 (11/24 0503) Resp:  [12-19] 16 (11/24 0503) BP: (98-122)/(57-88) 102/57 mmHg (11/24 0503) SpO2:  [100 %] 100 % (11/24 0503) Last BM Date: 08/31/14 General:   Alert,  Well-developed,    in NAD Heart:  Regular rate and rhythm; no murmurs Pulm;lungs clear Abdomen:  Soft, nontender and nondistended. Normal bowel sounds, without guarding, and without rebound.   Extremities:  Without edema. Neurologic:  Alert and  oriented x4;  grossly normal neurologically. Psych:  Alert and cooperative. Normal mood and affect.  Intake/Output from previous day: 11/23 0701 - 11/24 0700 In: 2693.3 [P.O.:480; I.V.:2213.3] Out: 300 [Urine:300] Intake/Output this shift:    Lab Results:  Recent Labs  08/29/14 1529  08/30/14 0600 08/30/14 1145 08/31/14 0410  WBC 8.6  --   --   --  8.7  HGB 7.9*  < > 7.9* 8.4* 7.7*  HCT 22.9*  < > 23.0* 24.7* 23.3*  PLT 275  --   --   --  228  < > = values in this interval not displayed. BMET  Recent Labs  08/29/14 1529 08/30/14 0144 08/31/14 0410  NA 143 141 140  K 3.5* 2.7* 3.8  CL 102 101 105  CO2 27 27 24   GLUCOSE 124* 124* 108*  BUN 23 15 8   CREATININE 0.94 0.89 0.90  CALCIUM 9.4 9.0 9.1    ASSESSMENT/PLAN:  63 yo male admitted with melena. EGD with multiple duodenal ulcers. Would continue bid PPI and po iron. Advance to full liquids now, reg diet later today if tol. Will schedule f/u in GI office  In 6 weekswith f/u labs 1 week prior.     LOS: 2 days   Hvozdovic, Deloris Ping 08/31/2014, Pager [phone]  GI Attending Note  I have personally taken an interval history, reviewed the chart, and examined the  patient.  I agree with the extender's note, impression and recommendations.  Sandy Salaam. Deatra Ina, MD, [facility] [phone]

## 2014-09-02 LAB — TYPE AND SCREEN
ABO/RH(D): O POS
Antibody Screen: NEGATIVE
Unit division: 0
Unit division: 0
Unit division: 0

## 2014-09-22 ENCOUNTER — Telehealth: Payer: Self-pay | Admitting: *Deleted

## 2014-09-22 NOTE — Telephone Encounter (Signed)
-----   Message from Lafayette Dragon, MD sent at 09/22/2014 12:30 PM EST ----- He is Dr Lynne Leader PT. NEEDS FOLLOW UP WITH HIM. ----- Message -----    From: Hulan Saas, RN    Sent: 09/22/2014   9:28 AM      To: Lafayette Dragon, MD  Dr. Olevia Perches, You did a procedure on this patient on your hospital week. Does he need a letter or any f/u? Rollene Fare

## 2014-09-22 NOTE — Telephone Encounter (Signed)
Patient has f/u with Cecille Rubin Hvozdovic, PA-C on 09/28/14.

## 2014-09-28 ENCOUNTER — Other Ambulatory Visit (INDEPENDENT_AMBULATORY_CARE_PROVIDER_SITE_OTHER): Payer: BC Managed Care – PPO

## 2014-09-28 ENCOUNTER — Encounter: Payer: Self-pay | Admitting: Physician Assistant

## 2014-09-28 ENCOUNTER — Ambulatory Visit (INDEPENDENT_AMBULATORY_CARE_PROVIDER_SITE_OTHER): Payer: BC Managed Care – PPO | Admitting: Physician Assistant

## 2014-09-28 ENCOUNTER — Other Ambulatory Visit: Payer: BC Managed Care – PPO

## 2014-09-28 DIAGNOSIS — K269 Duodenal ulcer, unspecified as acute or chronic, without hemorrhage or perforation: Secondary | ICD-10-CM

## 2014-09-28 LAB — CBC WITH DIFFERENTIAL/PLATELET
Basophils Absolute: 0 10*3/uL (ref 0.0–0.1)
Basophils Relative: 0.4 % (ref 0.0–3.0)
Eosinophils Absolute: 0.2 10*3/uL (ref 0.0–0.7)
Eosinophils Relative: 4 % (ref 0.0–5.0)
HCT: 37.3 % — ABNORMAL LOW (ref 39.0–52.0)
Hemoglobin: 11.7 g/dL — ABNORMAL LOW (ref 13.0–17.0)
Lymphocytes Relative: 22.6 % (ref 12.0–46.0)
Lymphs Abs: 1 10*3/uL (ref 0.7–4.0)
MCHC: 31.3 g/dL (ref 30.0–36.0)
MCV: 98.8 fl (ref 78.0–100.0)
Monocytes Absolute: 0.6 10*3/uL (ref 0.1–1.0)
Monocytes Relative: 13.8 % — ABNORMAL HIGH (ref 3.0–12.0)
Neutro Abs: 2.6 10*3/uL (ref 1.4–7.7)
Neutrophils Relative %: 59.2 % (ref 43.0–77.0)
Platelets: 306 10*3/uL (ref 150.0–400.0)
RBC: 3.78 Mil/uL — ABNORMAL LOW (ref 4.22–5.81)
RDW: 14.4 % (ref 11.5–15.5)
WBC: 4.4 10*3/uL (ref 4.0–10.5)

## 2014-09-28 NOTE — Progress Notes (Signed)
Patient ID: Keith [name], male   DOB: 03/08/1951, 63 y.o.   MRN: [id number]     History of Present Illness:   Keith [name] is a delightful 63 year old male with a history of hypertension and obesity who was admitted to the hospital on November 22 with hematemesis and melena. In having some mild epigastric discomfort. While in the Hospital he underwent an EGD by Dr. Maurene Capes on 08/29/2014. He was noted to have multiple duodenal bulb and duodenal ulcerations with slow bleeding. Hemostasis was achieved with multiple injections of epinephrine into the edges of the ulcer. He had a nonobstructing distal esophageal stricture which was not dilated. A 2 cm reducible hiatal hernia was noted biopsies from the gastric antrum were taken to rule out H. pylori and were negative. He was discharged home on oral iron supplementation and twice a day Protonix and returns for follow-up today. He is feeling well. He states that his energy level is better than it has been in a long time. He has no abdominal pain and no dysphasia. He has no heartburn. He has had no bright red blood per rectum or melena. He has a follow-up with his primary care provider in late January. Patient reports he had a colonoscopy 5 years ago which was normal.   Past Medical History  Diagnosis Date  . Hypertension   . Obesity   . ED (erectile dysfunction)   . Glucose intolerance (impaired glucose tolerance)   . Osteoarthritis   . Allergy   . Spinal stenosis of lumbar region   . Urinary tract infection 04/11/2013  . Hypokalemia 04/11/2013  . Chronic back pain   . Duodenal ulcer hemorrhage 08/29/2014  . Hiatal hernia 08/30/2014  . Esophageal stricture 08/30/2014  . MRSA carrier 08/30/2014    Past Surgical History  Procedure Laterality Date  . Tonsillectomy    . Lumbar laminectomy    . Nasal polyp surgery    . Esophagogastroduodenoscopy N/A 08/29/2014    Procedure: ESOPHAGOGASTRODUODENOSCOPY (EGD);  Surgeon: Lafayette Dragon, MD;  Location: [facility];  Service: Endoscopy;  Laterality: N/A;   Family History  Problem Relation Age of Onset  . Diabetes Mother   . Asthma Mother   . Hypertension Father   . Stroke Father    History  Substance Use Topics  . Smoking status: Former Smoker -- 1.00 packs/day for 25 years  . Smokeless tobacco: Never Used  . Alcohol Use: No   Current Outpatient Prescriptions  Medication Sig Dispense Refill  . atenolol-chlorthalidone (TENORETIC) 50-25 MG per tablet Take 1 tablet by mouth daily.    . cloNIDine (CATAPRES) 0.1 MG tablet Take 1 tablet (0.1 mg total) by mouth every morning. 60 tablet 3  . cyclobenzaprine (FLEXERIL) 10 MG tablet Take 10 mg by mouth.    . ferrous sulfate 325 (65 FE) MG tablet Take 1 tablet (325 mg total) by mouth 2 (two) times daily with a meal. 60 tablet 3  . fluticasone (FLONASE) 50 MCG/ACT nasal spray Place 2 sprays into both nostrils at bedtime. 16 g 11  . gabapentin (NEURONTIN) 400 MG capsule Take 400 mg by mouth every evening.   5  . Multiple Vitamin (MULITIVITAMIN WITH MINERALS) TABS Take 1 tablet by mouth daily.    Marland Kitchen oxyCODONE-acetaminophen (PERCOCET) 10-325 MG per tablet Take 1 tablet by mouth every 8 (eight) hours as needed for pain.   0  . pantoprazole (PROTONIX) 40 MG tablet Take 1 tablet (40 mg total) by mouth 2 (two) times daily. [place]  tablet 3  . sildenafil (VIAGRA) 100 MG tablet Take 100 mg by mouth daily as needed for erectile dysfunction.     No current facility-administered medications for this visit.   No Known Allergies    Review of Systems: Gen: Denies any fever, chills, sweats, anorexia, fatigue, weakness, malaise, weight loss, and sleep disorder CV: Denies chest pain, angina, palpitations, syncope, orthopnea, PND, peripheral edema, and claudication. Resp: Denies dyspnea at rest, dyspnea with exercise, cough, sputum, wheezing, coughing up blood, and pleurisy. GI: Denies vomiting blood, jaundice, and fecal incontinence.   Denies dysphagia or  odynophagia. GU : Denies urinary burning, blood in urine, urinary frequency, urinary hesitancy, nocturnal urination, and urinary incontinence. MS: Denies joint pain, limitation of movement, and swelling, stiffness, low back pain, extremity pain. Denies muscle weakness, cramps, atrophy.  Derm: Denies rash, itching, dry skin, hives, moles, warts, or unhealing ulcers.  Psych: Denies depression, anxiety, memory loss, suicidal ideation, hallucinations, paranoia, and confusion. Heme: Denies bruising, bleeding, and enlarged lymph nodes. Neuro:  Denies any headaches, dizziness, paresthesia Endo:  Denies any problems with DM, thyroid, adrenal  LAB RESULTS: CBC on 08/31/2014 had a white blood cell count 8.7, hemoglobin 7.7, hematocrit 23.3, platelets 228,000. MCV 96.3.   PROCEDURES: EGD 08/29/14:  ENDOSCOPIC IMPRESSION: 1. multiple duodenal bulb and duodenal outlet ulcerations with slow bleeding. Hemostasis achieved with multiple injections of it epinephrine into the edges of the ulcers. Total of 5 cc 2. Nonobstructing distal esophageal stricture not dilated 3. 2 cm reducible hiatal hernia 4. Biopsy from the gastric antrum to rule out H. pylori RECOMMENDATIONS bowel rest tonight Follow hemoglobin and hematocrit through the night and next 24 hours aspirin has been discontinued continue Protonix IV Await biopsies to rule out H. pylori and treat appropriately if positive Discussed with the patient pain management for his low back pain Will most likely need oral iron supplements alone for 4-6 weeks long-term acid suppressing therapy    Physical Exam: General: Pleasant, well developed male in no acute distress Head: Normocephalic and atraumatic Eyes:  sclerae anicteric, conjunctiva pink  Ears: Normal auditory acuity Lungs: Clear throughout to auscultation Heart: Regular rate and rhythm Abdomen: Soft, non distended, non-tender. No masses, no hepatomegaly. Normal bowel  sounds Musculoskeletal: Symmetrical with no gross deformities  Extremities: No edema  Neurological: Alert oriented x 4, grossly nonfocal Psychological:  Alert and cooperative. Normal mood and affect  Assessment and Recommendations: 63 year old male status post admission for hematemesis and melena found to have duodenal ulcers on EGD here for follow-up. He feels much better on twice a day pantoprazole. He has been advised to continue his twice a day pantoprazole till the end of January and then he may cut back to once daily pantoprazole to be taken 30 minutes prior to breakfast each morning. He will continue his twice a day iron. A repeat CBC will be obtained today. He has been advised that if he develops recurrent hematemesis or melena he should follow-up immediately.   Tequila Rottmann, Deloris Ping 09/28/2014,

## 2014-09-28 NOTE — Patient Instructions (Signed)
Your physician has requested that you go to the basement for the following lab work before leaving today: CBC We will call you with your lab results.  Continue your Pantoprazole twice daily until the end of January 2016 then decrease to once daily.  Continue your iron twice daily.

## 2014-09-28 NOTE — Progress Notes (Signed)
Reviewed and agree with management plan. Please place records in Epic from prior colonoscopy with appropriate recall.  Pricilla Riffle. Fuller Plan, MD [facility]

## 2014-10-06 ENCOUNTER — Encounter: Payer: Self-pay | Admitting: Gastroenterology

## 2014-10-08 DIAGNOSIS — M419 Scoliosis, unspecified: Secondary | ICD-10-CM

## 2014-10-08 HISTORY — DX: Scoliosis, unspecified: M41.9

## 2014-11-01 ENCOUNTER — Other Ambulatory Visit: Payer: Self-pay | Admitting: *Deleted

## 2014-11-01 DIAGNOSIS — Z0000 Encounter for general adult medical examination without abnormal findings: Secondary | ICD-10-CM

## 2014-11-02 ENCOUNTER — Other Ambulatory Visit (INDEPENDENT_AMBULATORY_CARE_PROVIDER_SITE_OTHER): Payer: BLUE CROSS/BLUE SHIELD

## 2014-11-02 DIAGNOSIS — D62 Acute posthemorrhagic anemia: Secondary | ICD-10-CM

## 2014-11-02 DIAGNOSIS — Z0000 Encounter for general adult medical examination without abnormal findings: Secondary | ICD-10-CM

## 2014-11-02 LAB — POCT URINALYSIS DIPSTICK
Bilirubin, UA: NEGATIVE
Blood, UA: NEGATIVE
Glucose, UA: NEGATIVE
Ketones, UA: NEGATIVE
Nitrite, UA: NEGATIVE
Protein, UA: NEGATIVE
Spec Grav, UA: 1.02
Urobilinogen, UA: 0.2
pH, UA: 6.5

## 2014-11-02 LAB — COMPREHENSIVE METABOLIC PANEL
ALT: 23 U/L (ref 0–53)
AST: 27 U/L (ref 0–37)
Albumin: 4.2 g/dL (ref 3.5–5.2)
Alkaline Phosphatase: 99 U/L (ref 39–117)
BUN: 17 mg/dL (ref 6–23)
CO2: 28 mEq/L (ref 19–32)
Calcium: 10 mg/dL (ref 8.4–10.5)
Chloride: 106 mEq/L (ref 96–112)
Creatinine, Ser: 1.09 mg/dL (ref 0.40–1.50)
GFR: 88.07 mL/min (ref >60.00)
Glucose, Bld: 134 mg/dL — ABNORMAL HIGH (ref 70–99)
Potassium: 3.9 mEq/L (ref 3.5–5.1)
Sodium: 144 mEq/L (ref 135–145)
Total Bilirubin: 0.2 mg/dL (ref 0.2–1.2)
Total Protein: 7.8 g/dL (ref 6.0–8.3)

## 2014-11-02 LAB — CBC WITH DIFFERENTIAL/PLATELET
Basophils Absolute: 0 10*3/uL (ref 0.0–0.1)
Basophils Relative: 0.3 % (ref 0.0–3.0)
Eosinophils Absolute: 0.1 10*3/uL (ref 0.0–0.7)
Eosinophils Relative: 2.1 % (ref 0.0–5.0)
HCT: 39.7 % (ref 39.0–52.0)
Hemoglobin: 13.1 g/dL (ref 13.0–17.0)
Lymphocytes Relative: 16.4 % (ref 12.0–46.0)
Lymphs Abs: 1.1 10*3/uL (ref 0.7–4.0)
MCHC: 32.9 g/dL (ref 30.0–36.0)
MCV: 93.9 fl (ref 78.0–100.0)
Monocytes Absolute: 0.7 10*3/uL (ref 0.1–1.0)
Monocytes Relative: 10.8 % (ref 3.0–12.0)
Neutro Abs: 4.8 10*3/uL (ref 1.4–7.7)
Neutrophils Relative %: 70.4 % (ref 43.0–77.0)
Platelets: 310 10*3/uL (ref 150.0–400.0)
RBC: 4.23 Mil/uL (ref 4.22–5.81)
RDW: 14 % (ref 11.5–15.5)
WBC: 6.8 10*3/uL (ref 4.0–10.5)

## 2014-11-02 LAB — LIPID PANEL
Cholesterol: 181 mg/dL (ref 0–200)
HDL: 43.8 mg/dL (ref >39.00)
LDL Cholesterol: 102 mg/dL — ABNORMAL HIGH (ref 0–99)
NonHDL: 137.2
Total CHOL/HDL Ratio: 4
Triglycerides: 176 mg/dL — ABNORMAL HIGH (ref 0.0–149.0)
VLDL: 35.2 mg/dL (ref 0.0–40.0)

## 2014-11-02 LAB — TSH: TSH: 1.42 u[IU]/mL (ref 0.35–4.50)

## 2014-11-02 LAB — PSA: PSA: 0.52 ng/mL (ref 0.10–4.00)

## 2014-11-09 ENCOUNTER — Ambulatory Visit (INDEPENDENT_AMBULATORY_CARE_PROVIDER_SITE_OTHER): Payer: BLUE CROSS/BLUE SHIELD | Admitting: Family Medicine

## 2014-11-09 ENCOUNTER — Encounter: Payer: Self-pay | Admitting: Family Medicine

## 2014-11-09 VITALS — BP 142/82 | Temp 98.2°F | Ht 70.5 in | Wt 274.0 lb

## 2014-11-09 DIAGNOSIS — M4806 Spinal stenosis, lumbar region: Secondary | ICD-10-CM

## 2014-11-09 DIAGNOSIS — I10 Essential (primary) hypertension: Secondary | ICD-10-CM

## 2014-11-09 DIAGNOSIS — M48061 Spinal stenosis, lumbar region without neurogenic claudication: Secondary | ICD-10-CM

## 2014-11-09 DIAGNOSIS — Z23 Encounter for immunization: Secondary | ICD-10-CM

## 2014-11-09 DIAGNOSIS — R7309 Other abnormal glucose: Secondary | ICD-10-CM

## 2014-11-09 DIAGNOSIS — R739 Hyperglycemia, unspecified: Secondary | ICD-10-CM

## 2014-11-09 DIAGNOSIS — E669 Obesity, unspecified: Secondary | ICD-10-CM

## 2014-11-09 DIAGNOSIS — F528 Other sexual dysfunction not due to a substance or known physiological condition: Secondary | ICD-10-CM

## 2014-11-09 MED ORDER — SILDENAFIL CITRATE 100 MG PO TABS: 100.0000 mg | ORAL_TABLET | Freq: Every day | ORAL | Status: DC | PRN

## 2014-11-09 MED ORDER — ATENOLOL-CHLORTHALIDONE 50-25 MG PO TABS: 1.0000 | ORAL_TABLET | Freq: Every day | ORAL | Status: DC

## 2014-11-09 MED ORDER — FLUTICASONE PROPIONATE 50 MCG/ACT NA SUSP: 2.0000 | Freq: Every day | NASAL | Status: DC

## 2014-11-09 MED ORDER — SILDENAFIL CITRATE 20 MG PO TABS: 20.0000 mg | ORAL_TABLET | Freq: Three times a day (TID) | ORAL | Status: DC

## 2014-11-09 MED ORDER — CLONIDINE HCL 0.1 MG PO TABS: 0.1000 mg | ORAL_TABLET | Freq: Every morning | ORAL | Status: DC

## 2014-11-09 NOTE — Patient Instructions (Signed)
Go back to diet exercise and weight loss program  Return in May for nonfasting labs,,,,,,, blood sugar and A1c,,,,,,, 11 AM or 4 PM,,,,,,,,, and see me for follow-up a couple days after that  Continue other medications

## 2014-11-09 NOTE — Progress Notes (Signed)
Subjective:    Patient ID: Keith [name], male    DOB: August 10, 1951, 64 y.o.   MRN: [id number]  HPI Keith [name] is a 64 year old married male nonsmoker who comes in today for general physical examination because of a history of hypertension, allergic rhinitis, chronic back pain, a recent GI bleed work she was hospitalized in the fall 2015, and erectile dysfunction, and obesity  Weight is still 274 pounds. He says his gained weight this winter. He started a diet exercise program lost some weight and gained it back  He was taking a lot of aspirin and developed a GI bleed was admitted the hospital. Recovered without sequelae. He's followed in GI. Currently on Protonix 40 mg twice a day  Uses steroid nasal spray for allergic rhinitis when necessary and sees Dr. Ace Gins for his back pain. He's had back surgery in the past not spinal stenosis. He takes 400 mg of Neurontin daily and Percocet via Dr. Ace Gins for severe pain  He uses Viagra when necessary for ED would like to know if there is a generic. He's recently been on iron since he had the GI bleed hemoglobin back to normal therefore asked him to go ahead and stop the iron.  He does not get routine eye care referred to Dr. Bing Plume, regular dental care, colonoscopy 2008 normal, tetanus booster 2015 declines a shingles vaccine. Was given a pneumonia vaccine today. Asked to call his insurance company to find out where he can get the shingles vaccine   Review of Systems  Constitutional: Negative.   HENT: Negative.   Eyes: Negative.   Respiratory: Negative.   Cardiovascular: Negative.   Gastrointestinal: Negative.   Endocrine: Negative.   Genitourinary: Negative.   Musculoskeletal: Negative.   Skin: Negative.   Allergic/Immunologic: Negative.   Neurological: Negative.   Hematological: Negative.   Psychiatric/Behavioral: Negative.        Objective:   Physical Exam  Constitutional: He is oriented to person, place, and time. He appears  well-developed and well-nourished.  HENT:  Head: Normocephalic and atraumatic.  Right Ear: External ear normal.  Left Ear: External ear normal.  Nose: Nose normal.  Mouth/Throat: Oropharynx is clear and moist.  Eyes: Conjunctivae and EOM are normal. Pupils are equal, round, and reactive to light.  Neck: Normal range of motion. Neck supple. No JVD present. No tracheal deviation present. No thyromegaly present.  Cardiovascular: Normal rate, regular rhythm, normal heart sounds and intact distal pulses.  Exam reveals no gallop and no friction rub.   No murmur heard. No carotid bruits  Pulmonary/Chest: Effort normal and breath sounds normal. No stridor. No respiratory distress. He has no wheezes. He has no rales. He exhibits no tenderness.  Abdominal: Soft. Bowel sounds are normal. He exhibits no distension and no mass. There is no tenderness. There is no rebound and no guarding.  Genitourinary: Rectum normal and penis normal. Guaiac negative stool. No penile tenderness.  1+ symmetrical nonnodular BPH  Musculoskeletal: Normal range of motion. He exhibits no edema or tenderness.  Lymphadenopathy:    He has no cervical adenopathy.  Neurological: He is alert and oriented to person, place, and time. He has normal reflexes. No cranial nerve deficit. He exhibits normal muscle tone.  Skin: Skin is warm and dry. No rash noted. No erythema. No pallor.  Total body skin exam normal scar lower midline back from previous disc surgery  Psychiatric: He has a normal mood and affect. His behavior is normal. Judgment and thought content normal.  Nursing note and vitals reviewed.         Assessment & Plan:  Hypertension at goal........... continue current therapy  Allergic rhinitis..... Continue current therapy chronic back pain...... again stressed diet exercise and weight loss follow-up with Dr. Ace Gins  Recent GI bleed fall 2015....... continue to avoid all aspirin and aspirin derivatives only  Tylenol....... Protonix twice a day follow-up in GI  Erectile dysfunction refill Viagra..........  Obesity with now elevated blood sugar,,,,, 136,,,,,,,,, diet exercise weight loss program return in May for follow-up blood sugar and A1c

## 2014-11-09 NOTE — Progress Notes (Signed)
Pre visit review using our clinic review tool, if applicable. No additional management support is needed unless otherwise documented below in the visit note. 

## 2014-11-09 NOTE — Addendum Note (Signed)
Addended by: Westley Hummer B on: 11/09/2014 03:03 PM   Modules accepted: Orders

## 2014-12-16 ENCOUNTER — Other Ambulatory Visit: Payer: Self-pay | Admitting: Internal Medicine

## 2015-01-12 ENCOUNTER — Other Ambulatory Visit: Payer: Self-pay | Admitting: Family Medicine

## 2015-02-15 ENCOUNTER — Other Ambulatory Visit (INDEPENDENT_AMBULATORY_CARE_PROVIDER_SITE_OTHER): Payer: BLUE CROSS/BLUE SHIELD

## 2015-02-15 DIAGNOSIS — Z9189 Other specified personal risk factors, not elsewhere classified: Secondary | ICD-10-CM

## 2015-02-15 LAB — HEMOGLOBIN A1C: Hgb A1c MFr Bld: 5.5 % (ref 4.6–6.5)

## 2015-02-22 ENCOUNTER — Ambulatory Visit (INDEPENDENT_AMBULATORY_CARE_PROVIDER_SITE_OTHER): Payer: BLUE CROSS/BLUE SHIELD | Admitting: Family Medicine

## 2015-02-22 ENCOUNTER — Encounter: Payer: Self-pay | Admitting: Family Medicine

## 2015-02-22 VITALS — BP 120/80 | Temp 98.1°F | Wt 274.0 lb

## 2015-02-22 DIAGNOSIS — R7309 Other abnormal glucose: Secondary | ICD-10-CM | POA: Diagnosis not present

## 2015-02-22 DIAGNOSIS — R739 Hyperglycemia, unspecified: Secondary | ICD-10-CM

## 2015-02-22 DIAGNOSIS — E669 Obesity, unspecified: Secondary | ICD-10-CM

## 2015-02-22 NOTE — Progress Notes (Signed)
   Subjective:    Patient ID: Keith [name], male    DOB: 08/29/1951, 64 y.o.   MRN: [id number]  HPI Keith [name] is a 64 year old male nonsmoker who comes in today for evaluation of elevated blood sugar  We saw him in February for his physical examination at that time his weight was 274 pounds. His weight is not change but he cut his sugar out and began walking a quarter of a mile day. A1c normal 5.5%  Blood pressure normal on medication   Review of Systems Review of systems otherwise negative    Objective:   Physical Exam  Well-developed overweight male no acute distress vital signs stable is afebrile BP 120/80 right arm sitting position      Assessment & Plan:  Elevated blood sugar....... control with diet and exercise...Marland KitchenMarland KitchenMarland Kitchen continue diet and exercise  Hypertension.......... continue current medications follow-up in February for annual exam

## 2015-02-22 NOTE — Patient Instructions (Signed)
Continue diet exercise and avoiding sugar  Follow-up the first week in February for your annual exam............. Tommi Rumps nafzinger our new adult Designer, jewellery from [employer] one week prior

## 2015-11-07 ENCOUNTER — Other Ambulatory Visit (INDEPENDENT_AMBULATORY_CARE_PROVIDER_SITE_OTHER): Payer: BLUE CROSS/BLUE SHIELD

## 2015-11-07 DIAGNOSIS — R7309 Other abnormal glucose: Secondary | ICD-10-CM

## 2015-11-07 DIAGNOSIS — E669 Obesity, unspecified: Secondary | ICD-10-CM | POA: Diagnosis not present

## 2015-11-07 DIAGNOSIS — R739 Hyperglycemia, unspecified: Secondary | ICD-10-CM

## 2015-11-07 LAB — MICROALBUMIN / CREATININE URINE RATIO
Creatinine,U: 266.9 mg/dL
Microalb Creat Ratio: 2.8 mg/g (ref 0.0–30.0)
Microalb, Ur: 7.4 mg/dL — ABNORMAL HIGH (ref 0.0–1.9)

## 2015-11-07 LAB — BASIC METABOLIC PANEL
BUN: 19 mg/dL (ref 6–23)
CO2: 27 mEq/L (ref 19–32)
Calcium: 9.7 mg/dL (ref 8.4–10.5)
Chloride: 104 mEq/L (ref 96–112)
Creatinine, Ser: 1.03 mg/dL (ref 0.40–1.50)
GFR: 93.71 mL/min (ref >60.00)
Glucose, Bld: 122 mg/dL — ABNORMAL HIGH (ref 70–99)
Potassium: 3.8 mEq/L (ref 3.5–5.1)
Sodium: 141 mEq/L (ref 135–145)

## 2015-11-07 LAB — HEPATIC FUNCTION PANEL
ALT: 24 U/L (ref 0–53)
AST: 27 U/L (ref 0–37)
Albumin: 4.1 g/dL (ref 3.5–5.2)
Alkaline Phosphatase: 78 U/L (ref 39–117)
Bilirubin, Direct: 0.1 mg/dL (ref 0.0–0.3)
Total Bilirubin: 0.2 mg/dL (ref 0.2–1.2)
Total Protein: 8 g/dL (ref 6.0–8.3)

## 2015-11-07 LAB — LIPID PANEL
Cholesterol: 162 mg/dL (ref 0–200)
HDL: 45 mg/dL (ref >39.00)
LDL Cholesterol: 104 mg/dL — ABNORMAL HIGH (ref 0–99)
NonHDL: 116.93
Total CHOL/HDL Ratio: 4
Triglycerides: 64 mg/dL (ref 0.0–149.0)
VLDL: 12.8 mg/dL (ref 0.0–40.0)

## 2015-11-07 LAB — CBC WITH DIFFERENTIAL/PLATELET
Basophils Absolute: 0 10*3/uL (ref 0.0–0.1)
Basophils Relative: 0.4 % (ref 0.0–3.0)
Eosinophils Absolute: 0.2 10*3/uL (ref 0.0–0.7)
Eosinophils Relative: 4.1 % (ref 0.0–5.0)
HCT: 38.3 % — ABNORMAL LOW (ref 39.0–52.0)
Hemoglobin: 12.5 g/dL — ABNORMAL LOW (ref 13.0–17.0)
Lymphocytes Relative: 25.2 % (ref 12.0–46.0)
Lymphs Abs: 1.1 10*3/uL (ref 0.7–4.0)
MCHC: 32.6 g/dL (ref 30.0–36.0)
MCV: 98.6 fl (ref 78.0–100.0)
Monocytes Absolute: 0.5 10*3/uL (ref 0.1–1.0)
Monocytes Relative: 11.4 % (ref 3.0–12.0)
Neutro Abs: 2.6 10*3/uL (ref 1.4–7.7)
Neutrophils Relative %: 58.9 % (ref 43.0–77.0)
Platelets: 314 10*3/uL (ref 150.0–400.0)
RBC: 3.89 Mil/uL — ABNORMAL LOW (ref 4.22–5.81)
RDW: 15.5 % (ref 11.5–15.5)
WBC: 4.4 10*3/uL (ref 4.0–10.5)

## 2015-11-07 LAB — PSA: PSA: 1.05 ng/mL (ref 0.10–4.00)

## 2015-11-07 LAB — POCT URINALYSIS DIPSTICK
Bilirubin, UA: NEGATIVE
Glucose, UA: NEGATIVE
Ketones, UA: NEGATIVE
Nitrite, UA: NEGATIVE
Spec Grav, UA: 1.025
Urobilinogen, UA: 0.2
pH, UA: 5.5

## 2015-11-07 LAB — TSH: TSH: 0.39 u[IU]/mL (ref 0.35–4.50)

## 2015-11-07 LAB — HEMOGLOBIN A1C: Hgb A1c MFr Bld: 5.9 % (ref 4.6–6.5)

## 2015-11-14 ENCOUNTER — Ambulatory Visit (INDEPENDENT_AMBULATORY_CARE_PROVIDER_SITE_OTHER): Payer: BLUE CROSS/BLUE SHIELD | Admitting: Family Medicine

## 2015-11-14 ENCOUNTER — Encounter: Payer: Self-pay | Admitting: Family Medicine

## 2015-11-14 VITALS — BP 130/90 | Temp 98.8°F | Ht 70.5 in | Wt 269.0 lb

## 2015-11-14 DIAGNOSIS — I10 Essential (primary) hypertension: Secondary | ICD-10-CM

## 2015-11-14 DIAGNOSIS — Z0000 Encounter for general adult medical examination without abnormal findings: Secondary | ICD-10-CM | POA: Insufficient documentation

## 2015-11-14 DIAGNOSIS — F528 Other sexual dysfunction not due to a substance or known physiological condition: Secondary | ICD-10-CM | POA: Diagnosis not present

## 2015-11-14 DIAGNOSIS — E669 Obesity, unspecified: Secondary | ICD-10-CM | POA: Diagnosis not present

## 2015-11-14 MED ORDER — DOXYCYCLINE HYCLATE 100 MG PO TABS: 100.0000 mg | ORAL_TABLET | Freq: Two times a day (BID) | ORAL | Status: DC

## 2015-11-14 MED ORDER — ATENOLOL-CHLORTHALIDONE 50-25 MG PO TABS: 1.0000 | ORAL_TABLET | Freq: Every day | ORAL | Status: DC

## 2015-11-14 MED ORDER — CLONIDINE HCL 0.1 MG PO TABS: ORAL_TABLET | ORAL | Status: DC

## 2015-11-14 MED ORDER — SILDENAFIL CITRATE 20 MG PO TABS: 20.0000 mg | ORAL_TABLET | Freq: Three times a day (TID) | ORAL | Status: DC

## 2015-11-14 NOTE — Patient Instructions (Addendum)
Walk 15 minutes 4 times daily  Complete carbohydrate free diet  Dr. Gloriann Loan DDS  Dr. Delman Cheadle ophthalmologist  Call Dr. Kirk Ruths..... Discuss a second opinion on your back with Dr. Cyndy Freeze one of our neurosurgeons here in [facility] or Almyra Free are 2 new adult nurse practitioners or Dr. [name]  Doxycycline 100 mg twice daily for 3 weeks..........Marland Kitchen return in 4 weeks for follow-up urinalysis in office visit

## 2015-11-14 NOTE — Progress Notes (Signed)
Pre visit review using our clinic review tool, if applicable. No additional management support is needed unless otherwise documented below in the visit note. 

## 2015-11-14 NOTE — Progress Notes (Signed)
Subjective:    Patient ID: Keith [name], male    DOB: 06/13/1951, 65 y.o.   MRN: [id number]  HPI Keith [name] is a 65 year old male nonsmoker who comes in today for general physical examination  He has a history of underlying hypertension on Tenoretic 50-25 daily and Catapres 0.1 daily BP is 130/90. BP at home 130/80  He also is on Percocet 10 mg every 8 hours because of back pain. He had surgery in [facility] been neurosurgeon. Is now seeing Dr. Kirk Ruths follow-up in pain management. He's frustrated with the fact that he can't walk any has to take pain medication. I discussed with him all the options including getting a second opinion from one of our [place] neurosurgeons like Dr. Cyndy Freeze  He uses of arterial when necessary for ED  He does not get routine eye care nor dental care. Recommended Dr. Delman Cheadle and Dr. Gloriann Loan.  Colonoscopy 9 years ago normal  Vaccinations up-to-date except he declines a flu shot. Says he took 1 years ago when he was in the [employer] it made him sick. After thorough explanation he still declines     Review of Systems  Constitutional: Negative.   HENT: Negative.   Eyes: Negative.   Respiratory: Negative.   Cardiovascular: Negative.   Gastrointestinal: Negative.   Endocrine: Negative.   Genitourinary: Negative.   Musculoskeletal: Negative.   Skin: Negative.   Allergic/Immunologic: Negative.   Neurological: Negative.   Hematological: Negative.   Psychiatric/Behavioral: Negative.        Objective:   Physical Exam  Constitutional: He is oriented to person, place, and time. He appears well-developed and well-nourished.  HENT:  Head: Normocephalic and atraumatic.  Right Ear: External ear normal.  Left Ear: External ear normal.  Nose: Nose normal.  Mouth/Throat: Oropharynx is clear and moist.  Eyes: Conjunctivae and EOM are normal. Pupils are equal, round, and reactive to light.  Neck: Normal range of motion. Neck supple. No JVD present. No tracheal deviation  present. No thyromegaly present.  Cardiovascular: Normal rate, regular rhythm, normal heart sounds and intact distal pulses.  Exam reveals no gallop and no friction rub.   No murmur heard. No carotid aortic bruits peripheral pulses 2+ and symmetrical  Pulmonary/Chest: Effort normal and breath sounds normal. No stridor. No respiratory distress. He has no wheezes. He has no rales. He exhibits no tenderness.  Abdominal: Soft. Bowel sounds are normal. He exhibits no distension and no mass. There is no tenderness. There is no rebound and no guarding.  Genitourinary: Rectum normal, prostate normal and penis normal. Guaiac negative stool. No penile tenderness.  Musculoskeletal: Normal range of motion. He exhibits no edema or tenderness.  Deformity left foot external rotation  Lymphadenopathy:    He has no cervical adenopathy.  Neurological: He is alert and oriented to person, place, and time. He has normal reflexes. No cranial nerve deficit. He exhibits normal muscle tone.  Skin: Skin is warm and dry. No rash noted. No erythema. No pallor.  Psychiatric: He has a normal mood and affect. His behavior is normal. Judgment and thought content normal.  Nursing note and vitals reviewed.         Assessment & Plan:  Obesity.......... again as we have before counseled about diet exercise and weight loss  Hypertension at goal....... continue current therapy  Erectile dysfunction........ continue generic Viagra  Chronic back pain status post surgery........ second opinion with Dr. Cyndy Freeze  Hematuria with white cells although low-grade prostatitis.......... doxycycline 100 twice a day  for 3 weeks return in 4 weeks for follow-up  Glucose intolerance......... again stressed diet exercise and weight loss

## 2015-12-19 ENCOUNTER — Encounter: Payer: Self-pay | Admitting: *Deleted

## 2015-12-19 ENCOUNTER — Encounter: Payer: Self-pay | Admitting: Family Medicine

## 2015-12-19 ENCOUNTER — Ambulatory Visit (INDEPENDENT_AMBULATORY_CARE_PROVIDER_SITE_OTHER): Payer: BLUE CROSS/BLUE SHIELD | Admitting: Family Medicine

## 2015-12-19 VITALS — BP 130/90 | Temp 99.1°F | Wt 272.0 lb

## 2015-12-19 DIAGNOSIS — R300 Dysuria: Secondary | ICD-10-CM | POA: Diagnosis not present

## 2015-12-19 DIAGNOSIS — N390 Urinary tract infection, site not specified: Secondary | ICD-10-CM

## 2015-12-19 LAB — POCT URINALYSIS DIPSTICK
Bilirubin, UA: NEGATIVE
Glucose, UA: NEGATIVE
Ketones, UA: NEGATIVE
Nitrite, UA: NEGATIVE
Protein, UA: NEGATIVE
Spec Grav, UA: 1.015
Urobilinogen, UA: 0.2
pH, UA: 7

## 2015-12-19 MED ORDER — CIPROFLOXACIN HCL 500 MG PO TABS: ORAL_TABLET | ORAL | Status: DC

## 2015-12-19 NOTE — Progress Notes (Signed)
   Subjective:    Patient ID: Keith [name], male    DOB: Jul 12, 1951, 65 y.o.   MRN: [id number]  HPI Keith [name] is a 65 year old married male nonsmoker who comes in today for follow-up urine tract infection. He had a relatively asymptomatic prostatitis about a month ago. He had 2+ blood and numerous white cells nitrate in his urine. Although he had not many symptoms. We started him on doxycycline 100 mg twice a day which she took for 3 weeks. He's here today for follow-up. Says he feels well. Urinalysis shows trace blood and moderate white cells nitrate negative   Review of Systems    review of systems negative well-developed well-nourished male no acute distress vital signs stable he is afebrile Objective:   Physical Exam  Well-developed well-nourished male no acute distress vital signs stable he is afebrile  Urinalysis as above      Assessment & Plan:  Prostatitis unresolved with doxycycline........ trial of Cipro

## 2015-12-19 NOTE — Patient Instructions (Signed)
Cipro 500 mg........Marland Kitchen 1 daily to bottle empty  Return in 4 weeks for follow-up urinalysis

## 2015-12-19 NOTE — Progress Notes (Signed)
Pre visit review using our clinic review tool, if applicable. No additional management support is needed unless otherwise documented below in the visit note. 

## 2016-01-16 ENCOUNTER — Ambulatory Visit (INDEPENDENT_AMBULATORY_CARE_PROVIDER_SITE_OTHER): Payer: BLUE CROSS/BLUE SHIELD | Admitting: Family Medicine

## 2016-01-16 ENCOUNTER — Encounter: Payer: Self-pay | Admitting: Family Medicine

## 2016-01-16 VITALS — BP 120/80 | Temp 99.4°F | Wt 271.0 lb

## 2016-01-16 DIAGNOSIS — E669 Obesity, unspecified: Secondary | ICD-10-CM | POA: Diagnosis not present

## 2016-01-16 DIAGNOSIS — M48061 Spinal stenosis, lumbar region without neurogenic claudication: Secondary | ICD-10-CM

## 2016-01-16 DIAGNOSIS — M4806 Spinal stenosis, lumbar region: Secondary | ICD-10-CM | POA: Diagnosis not present

## 2016-01-16 DIAGNOSIS — R739 Hyperglycemia, unspecified: Secondary | ICD-10-CM

## 2016-01-16 DIAGNOSIS — Z87440 Personal history of urinary (tract) infections: Secondary | ICD-10-CM | POA: Diagnosis not present

## 2016-01-16 LAB — POCT URINALYSIS DIPSTICK
Bilirubin, UA: NEGATIVE
Blood, UA: NEGATIVE
Glucose, UA: NEGATIVE
Ketones, UA: NEGATIVE
Nitrite, UA: NEGATIVE
Protein, UA: NEGATIVE
Spec Grav, UA: 1.015
Urobilinogen, UA: 0.2
pH, UA: 7

## 2016-01-16 NOTE — Progress Notes (Signed)
   Subjective:    Patient ID: Keith [name], male    DOB: 25-Mar-1951, 65 y.o.   MRN: [id number]  HPI Keith [name] is a 65 year old married male nonsmoker who comes in today for follow-up of urinary tract infection  She's had a history of obesity of elevated blood sugars. He is not diabetic however he had a urinary tract infection about 2 months was unresponsive doxycycline for 3 weeks. We put him on Cipro 500 mg daily for 3 weeks he finished his Cipro a week ago. He says his urine now looks clear in these asymptomatic  He's had surgery in [place] by Dr. Garvin Fila a neurosurgeon for lumbar spinal stenosis many years ago. He's been seeing Dr. Ace Gins for chronic pain management. He tells me that Dr. but failure is going to retire and he would like to see one of the neurosurgeons here for an evaluation.   Review of Systems negative    review of systems negative Objective:   Physical Exam    Well-developed well-nourished male no acute distress vital signs stable he is afebrile urinalysis normal     Assessment & Plan:  prostatitis resolved  Chronic low back pain status post spinal stenosis surgery ......... consult with our neurosurgeons

## 2016-01-16 NOTE — Progress Notes (Signed)
Pre visit review using our clinic review tool, if applicable. No additional management support is needed unless otherwise documented below in the visit note. 

## 2016-01-16 NOTE — Patient Instructions (Addendum)
We will get you set up a consult with neurosurgery to see if there is anything else that can be done about your back pain  Continue to avoid sugar  Walk 30 minutes daily  Call in October to get set up for your annual physical examination in February........ Tommi Rumps or [place] 2 new adult Designer, jewellery for Dr. [name]

## 2016-01-29 ENCOUNTER — Other Ambulatory Visit: Payer: Self-pay | Admitting: Family Medicine

## 2016-01-30 ENCOUNTER — Telehealth: Payer: Self-pay | Admitting: Family Medicine

## 2016-01-30 NOTE — Telephone Encounter (Signed)
Pt has called [facility].they have not received  Referral or note. Can you resend and let pt know.  [facility] fax: [phone]

## 2016-11-05 ENCOUNTER — Other Ambulatory Visit (INDEPENDENT_AMBULATORY_CARE_PROVIDER_SITE_OTHER): Payer: BLUE CROSS/BLUE SHIELD

## 2016-11-05 DIAGNOSIS — R300 Dysuria: Secondary | ICD-10-CM

## 2016-11-05 DIAGNOSIS — Z0000 Encounter for general adult medical examination without abnormal findings: Secondary | ICD-10-CM

## 2016-11-05 LAB — HEPATIC FUNCTION PANEL
ALT: 40 U/L (ref 0–53)
AST: 38 U/L — ABNORMAL HIGH (ref 0–37)
Albumin: 4.3 g/dL (ref 3.5–5.2)
Alkaline Phosphatase: 75 U/L (ref 39–117)
Bilirubin, Direct: 0.1 mg/dL (ref 0.0–0.3)
Total Bilirubin: 0.3 mg/dL (ref 0.2–1.2)
Total Protein: 7.5 g/dL (ref 6.0–8.3)

## 2016-11-05 LAB — CBC WITH DIFFERENTIAL/PLATELET
Basophils Absolute: 0 10*3/uL (ref 0.0–0.1)
Basophils Relative: 0.3 % (ref 0.0–3.0)
Eosinophils Absolute: 0.1 10*3/uL (ref 0.0–0.7)
Eosinophils Relative: 2.4 % (ref 0.0–5.0)
HCT: 39.1 % (ref 39.0–52.0)
Hemoglobin: 13.1 g/dL (ref 13.0–17.0)
Lymphocytes Relative: 16.8 % (ref 12.0–46.0)
Lymphs Abs: 0.9 10*3/uL (ref 0.7–4.0)
MCHC: 33.5 g/dL (ref 30.0–36.0)
MCV: 100.1 fl — ABNORMAL HIGH (ref 78.0–100.0)
Monocytes Absolute: 0.7 10*3/uL (ref 0.1–1.0)
Monocytes Relative: 12.4 % — ABNORMAL HIGH (ref 3.0–12.0)
Neutro Abs: 3.7 10*3/uL (ref 1.4–7.7)
Neutrophils Relative %: 68.1 % (ref 43.0–77.0)
Platelets: 319 10*3/uL (ref 150.0–400.0)
RBC: 3.91 Mil/uL — ABNORMAL LOW (ref 4.22–5.81)
RDW: 13.8 % (ref 11.5–15.5)
WBC: 5.5 10*3/uL (ref 4.0–10.5)

## 2016-11-05 LAB — POC URINALSYSI DIPSTICK (AUTOMATED)
Glucose, UA: NEGATIVE
Nitrite, UA: POSITIVE
Spec Grav, UA: 1.025
Urobilinogen, UA: 0.2
pH, UA: 5.5

## 2016-11-05 LAB — BASIC METABOLIC PANEL
BUN: 16 mg/dL (ref 6–23)
CO2: 26 mEq/L (ref 19–32)
Calcium: 9.9 mg/dL (ref 8.4–10.5)
Chloride: 105 mEq/L (ref 96–112)
Creatinine, Ser: 1.05 mg/dL (ref 0.40–1.50)
GFR: 91.37 mL/min (ref >60.00)
Glucose, Bld: 142 mg/dL — ABNORMAL HIGH (ref 70–99)
Potassium: 3.7 mEq/L (ref 3.5–5.1)
Sodium: 139 mEq/L (ref 135–145)

## 2016-11-05 LAB — LIPID PANEL
Cholesterol: 197 mg/dL (ref 0–200)
HDL: 49 mg/dL (ref >39.00)
LDL Cholesterol: 130 mg/dL — ABNORMAL HIGH (ref 0–99)
NonHDL: 148.37
Total CHOL/HDL Ratio: 4
Triglycerides: 90 mg/dL (ref 0.0–149.0)
VLDL: 18 mg/dL (ref 0.0–40.0)

## 2016-11-05 LAB — HEMOGLOBIN A1C: Hgb A1c MFr Bld: 6.3 % (ref 4.6–6.5)

## 2016-11-05 LAB — TSH: TSH: 0.58 u[IU]/mL (ref 0.35–4.50)

## 2016-11-05 LAB — PSA: PSA: 0.5 ng/mL (ref 0.10–4.00)

## 2016-11-08 LAB — URINE CULTURE

## 2016-11-12 MED ORDER — SULFAMETHOXAZOLE-TRIMETHOPRIM 800-160 MG PO TABS: ORAL_TABLET | ORAL | 0 refills | Status: DC

## 2016-11-14 ENCOUNTER — Encounter: Payer: Self-pay | Admitting: Family Medicine

## 2016-11-14 ENCOUNTER — Ambulatory Visit (INDEPENDENT_AMBULATORY_CARE_PROVIDER_SITE_OTHER): Payer: BLUE CROSS/BLUE SHIELD | Admitting: Family Medicine

## 2016-11-14 VITALS — BP 130/90 | HR 76 | Temp 98.8°F | Ht 71.0 in | Wt 289.9 lb

## 2016-11-14 DIAGNOSIS — E785 Hyperlipidemia, unspecified: Secondary | ICD-10-CM | POA: Diagnosis not present

## 2016-11-14 DIAGNOSIS — I10 Essential (primary) hypertension: Secondary | ICD-10-CM | POA: Diagnosis not present

## 2016-11-14 DIAGNOSIS — F528 Other sexual dysfunction not due to a substance or known physiological condition: Secondary | ICD-10-CM | POA: Diagnosis not present

## 2016-11-14 DIAGNOSIS — E6609 Other obesity due to excess calories: Secondary | ICD-10-CM | POA: Diagnosis not present

## 2016-11-14 DIAGNOSIS — E1169 Type 2 diabetes mellitus with other specified complication: Secondary | ICD-10-CM | POA: Insufficient documentation

## 2016-11-14 DIAGNOSIS — Z6838 Body mass index (BMI) 38.0-38.9, adult: Secondary | ICD-10-CM | POA: Diagnosis not present

## 2016-11-14 DIAGNOSIS — E119 Type 2 diabetes mellitus without complications: Secondary | ICD-10-CM

## 2016-11-14 DIAGNOSIS — E1140 Type 2 diabetes mellitus with diabetic neuropathy, unspecified: Secondary | ICD-10-CM | POA: Insufficient documentation

## 2016-11-14 DIAGNOSIS — Z0000 Encounter for general adult medical examination without abnormal findings: Secondary | ICD-10-CM

## 2016-11-14 MED ORDER — ATENOLOL-CHLORTHALIDONE 50-25 MG PO TABS: 1.0000 | ORAL_TABLET | Freq: Every day | ORAL | 3 refills | Status: DC

## 2016-11-14 MED ORDER — LISINOPRIL 5 MG PO TABS: 5.0000 mg | ORAL_TABLET | Freq: Every day | ORAL | 3 refills | Status: DC

## 2016-11-14 MED ORDER — METFORMIN HCL 500 MG PO TABS: ORAL_TABLET | ORAL | 3 refills | Status: DC

## 2016-11-14 MED ORDER — CLONIDINE HCL 0.1 MG PO TABS: ORAL_TABLET | ORAL | 3 refills | Status: DC

## 2016-11-14 MED ORDER — SILDENAFIL CITRATE 20 MG PO TABS
ORAL_TABLET | ORAL | 10 refills | Status: DC
Start: 2016-11-14 — End: 2017-08-01

## 2016-11-14 MED ORDER — SIMVASTATIN 20 MG PO TABS: 20.0000 mg | ORAL_TABLET | Freq: Every day | ORAL | 3 refills | Status: DC

## 2016-11-14 NOTE — Progress Notes (Signed)
Pre visit review using our clinic review tool, if applicable. No additional management support is needed unless otherwise documented below in the visit note. 

## 2016-11-14 NOTE — Patient Instructions (Signed)
Begin a diet and exercise program........... carbohydrate free diet and walk 30 minutes daily  Metformin 500 mg........... one half tab every morning with breakfast to lower your blood sugar  Zocor 20 mg........Marland Kitchen 1 tablet at bedtime to lower your cholesterol  Lisinopril 5 mg.......... one tablet in the morning to lower your blood pressure  Return the fourth week in April for fasting labs........... see me 1 week after lab work  Nutrition consult  Call your insurance company to find out where you can get the shingles vaccine the cheapest.  Dr. Hillis Range..............Marland Kitchen ophthalmologist  Dr. Gloriann Loan............ dentist

## 2016-11-14 NOTE — Progress Notes (Signed)
Keith [name] is a 66 year old married male nonsmoker who comes in today for annual physical examination because of a history of hypertension  Weight is up from last year. He's gained 18 pounds in the last 12 months. Blood sugars elevated in the diabetic range.  His blood pressure on Tenoretic 50-25 daily is 130/90. He also takes Catapres 0.1 mg daily.  He's going to the pain clinic is on Percocet 1 tablet every 8 hours for chronic low back pain. He says he has lumbar disc surgery and scoliosis. Instead of opting for another operation he opted for the pain clinic.  He uses generic Viagra. For ED  When he came in for his lab work prior to physical exam he noticed a urinary tract infection. Culture showed Klebsiella. He's on Septra DS which it's sensitive to. 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 Is due to retire in one year. Vaccinations up-to-date except she's not had a shingles vaccine. He had side effects from the flu shot therefore doesn't take them anymore.  14 point review of systems reviewed and otherwise negative  EKG was done because a history of hypertension EKG was unchanged and normal  BP 130/90 (BP Location: Right Arm, Patient Position: Sitting, Cuff Size: Normal)   Pulse 76   Temp 98.8 F (37.1 C) (Oral)   Ht 5\' 11"  (1.803 m)   Wt 289 lb 14.4 oz (131.5 kg)   BMI 40.43 kg/m  Jones well-developed well-nourished male no acute distress vital signs stable he is afebrile except for weight.  HEENT were negative except for early bilateral cataracts neck was supple thyroid is not enlarged no carotid bruits cardiopulmonary exam normal abdominal exam normal except for massive panniculus. Genitalia normal circumcised male rectum normal stool guaiac-negative prostate normal extremities normal skin normal peripheral pulses normal except for extremely flat feet.  #1 hypertension........ not at goal....  Add ACE inhibitor  #2 new onset diabetes........ diet exercise weight loss metformin 250 mg daily  #3 hyperlipidemia...... add Zocor 20 mg to get LDL below 75  #4 obesity........ diet exercise weight loss consult in the [facility] at [facility]  #5 chronic low back pain... Followed at the pain clinic on Percocet 10 mg 3 times daily.  #6 your tract infection.......Marland Kitchen finished Septra follow-up as outlined

## 2016-11-15 ENCOUNTER — Telehealth: Payer: Self-pay

## 2016-11-15 NOTE — Telephone Encounter (Signed)
Received PA request from [facility] for Sildenafil Citrate 20 mg tablets. PA submitted & is pending. Key: [id number]

## 2016-11-20 NOTE — Telephone Encounter (Signed)
PA denied, insurance won't cover medication unless its for PAH or secondary Raynaud's phenomenon.

## 2016-11-21 NOTE — Telephone Encounter (Signed)
Spoke with pt to inform him that his PA was denied and Per Dr. Sherren Mocha pt can [facility] and get 30 tabs for 45 dollars or try [place]. Pt understood and didn't have any further questions.

## 2016-11-21 NOTE — Addendum Note (Signed)
Addended by: Milford Cage on: 11/21/2016 08:54 AM   Modules accepted: Orders

## 2016-12-11 ENCOUNTER — Encounter: Payer: BLUE CROSS/BLUE SHIELD | Attending: Family Medicine | Admitting: Dietician

## 2016-12-11 ENCOUNTER — Encounter: Payer: Self-pay | Admitting: Dietician

## 2016-12-11 DIAGNOSIS — E119 Type 2 diabetes mellitus without complications: Secondary | ICD-10-CM | POA: Insufficient documentation

## 2016-12-11 DIAGNOSIS — Z6838 Body mass index (BMI) 38.0-38.9, adult: Secondary | ICD-10-CM | POA: Diagnosis not present

## 2016-12-11 DIAGNOSIS — E6609 Other obesity due to excess calories: Secondary | ICD-10-CM | POA: Insufficient documentation

## 2016-12-11 DIAGNOSIS — Z713 Dietary counseling and surveillance: Secondary | ICD-10-CM | POA: Diagnosis present

## 2016-12-11 NOTE — Progress Notes (Signed)
Patient was seen on 12/11/16 for the first of a series of three diabetes self-management courses at [facility].  Patient Education Plan per assessed needs and concerns is to attend four course education program for Diabetes Self Management Education.  The following learning objectives were met by the patient during this class:  Describe diabetes  State some common risk factors for diabetes  Defines the role of glucose and insulin  Identifies type of diabetes and pathophysiology  Describe the relationship between diabetes and cardiovascular risk  State the members of the Healthcare Team  States the rationale for glucose monitoring  State when to test glucose  State their individual Target Range  State the importance of logging glucose readings  Describe how to interpret glucose readings  Identifies A1C target  Explain the correlation between A1c and eAG values  State symptoms and treatment of high blood glucose  State symptoms and treatment of low blood glucose  Explain proper technique for glucose testing  Identifies proper sharps disposal  Handouts given during class include:  Living Well with Diabetes book  Carb Counting and Meal Planning book  Meal Plan Card  Carbohydrate guide  Meal planning worksheet  Low Sodium Flavoring Tips  The diabetes portion plate  Z1K to eAG Conversion Chart  Diabetes Medications  Diabetes Recommended Care Schedule  Support Group  Diabetes Success Plan  Core Class Satisfaction Survey  Follow-Up Plan:  Attend core 2

## 2016-12-18 ENCOUNTER — Encounter: Payer: BLUE CROSS/BLUE SHIELD | Admitting: Dietician

## 2016-12-18 DIAGNOSIS — Z713 Dietary counseling and surveillance: Secondary | ICD-10-CM | POA: Diagnosis not present

## 2016-12-18 DIAGNOSIS — E119 Type 2 diabetes mellitus without complications: Secondary | ICD-10-CM

## 2016-12-18 NOTE — Progress Notes (Signed)

## 2016-12-25 ENCOUNTER — Encounter: Payer: BLUE CROSS/BLUE SHIELD | Admitting: Dietician

## 2016-12-25 DIAGNOSIS — Z713 Dietary counseling and surveillance: Secondary | ICD-10-CM | POA: Diagnosis not present

## 2016-12-25 DIAGNOSIS — E119 Type 2 diabetes mellitus without complications: Secondary | ICD-10-CM

## 2016-12-25 NOTE — Progress Notes (Signed)
Patient was seen on 12/25/16 for the third of a series of three diabetes self-management courses at the [facility].   Catalina Gravel the amount of activity recommended for healthy living . Describe activities suitable for individual needs . Identify ways to regularly incorporate activity into daily life . Identify barriers to activity and ways to over come these barriers  Identify diabetes medications being personally used and their primary action for lowering glucose and possible side effects . Describe role of stress on blood glucose and develop strategies to address psychosocial issues . Identify diabetes complications and ways to prevent them  Explain how to manage diabetes during illness . Evaluate success in meeting personal goal . Establish 2-3 goals that they will plan to diligently work on until they return for the  86-month follow-up visit  Goals:   I will be active 30 minutes or more 5 times a week  I will eat less unhealthy fats by eating less trans fats and eat less carbohydrates  Your patient has identified these potential barriers to change:  Limitations due to back surgery  Your patient has identified their diabetes self-care support plan as  Family Support Plan:  Attend Support Group as desired

## 2017-01-14 ENCOUNTER — Other Ambulatory Visit: Payer: Self-pay | Admitting: Family Medicine

## 2017-02-04 ENCOUNTER — Other Ambulatory Visit (INDEPENDENT_AMBULATORY_CARE_PROVIDER_SITE_OTHER): Payer: BLUE CROSS/BLUE SHIELD

## 2017-02-04 DIAGNOSIS — E785 Hyperlipidemia, unspecified: Secondary | ICD-10-CM

## 2017-02-04 DIAGNOSIS — E1169 Type 2 diabetes mellitus with other specified complication: Secondary | ICD-10-CM

## 2017-02-04 DIAGNOSIS — E119 Type 2 diabetes mellitus without complications: Secondary | ICD-10-CM | POA: Diagnosis not present

## 2017-02-04 LAB — LIPID PANEL
Cholesterol: 122 mg/dL (ref 0–200)
HDL: 41.2 mg/dL (ref >39.00)
LDL Cholesterol: 67 mg/dL (ref 0–99)
NonHDL: 80.51
Total CHOL/HDL Ratio: 3
Triglycerides: 70 mg/dL (ref 0.0–149.0)
VLDL: 14 mg/dL (ref 0.0–40.0)

## 2017-02-04 LAB — BASIC METABOLIC PANEL
BUN: 18 mg/dL (ref 6–23)
CO2: 27 mEq/L (ref 19–32)
Calcium: 9.6 mg/dL (ref 8.4–10.5)
Chloride: 106 mEq/L (ref 96–112)
Creatinine, Ser: 0.93 mg/dL (ref 0.40–1.50)
GFR: 105.02 mL/min (ref >60.00)
Glucose, Bld: 116 mg/dL — ABNORMAL HIGH (ref 70–99)
Potassium: 3.7 mEq/L (ref 3.5–5.1)
Sodium: 141 mEq/L (ref 135–145)

## 2017-02-04 LAB — HEPATIC FUNCTION PANEL
ALT: 34 U/L (ref 0–53)
AST: 35 U/L (ref 0–37)
Albumin: 4 g/dL (ref 3.5–5.2)
Alkaline Phosphatase: 78 U/L (ref 39–117)
Bilirubin, Direct: 0.1 mg/dL (ref 0.0–0.3)
Total Bilirubin: 0.2 mg/dL (ref 0.2–1.2)
Total Protein: 7.3 g/dL (ref 6.0–8.3)

## 2017-02-12 ENCOUNTER — Ambulatory Visit (INDEPENDENT_AMBULATORY_CARE_PROVIDER_SITE_OTHER): Payer: BLUE CROSS/BLUE SHIELD | Admitting: Family Medicine

## 2017-02-12 ENCOUNTER — Encounter: Payer: Self-pay | Admitting: Family Medicine

## 2017-02-12 VITALS — BP 132/82 | Temp 98.0°F | Wt 283.0 lb

## 2017-02-12 DIAGNOSIS — I10 Essential (primary) hypertension: Secondary | ICD-10-CM

## 2017-02-12 DIAGNOSIS — E119 Type 2 diabetes mellitus without complications: Secondary | ICD-10-CM | POA: Diagnosis not present

## 2017-02-12 LAB — BASIC METABOLIC PANEL
BUN: 15 mg/dL (ref 6–23)
CO2: 26 mEq/L (ref 19–32)
Calcium: 9.7 mg/dL (ref 8.4–10.5)
Chloride: 106 mEq/L (ref 96–112)
Creatinine, Ser: 0.9 mg/dL (ref 0.40–1.50)
GFR: 109.06 mL/min (ref >60.00)
Glucose, Bld: 133 mg/dL — ABNORMAL HIGH (ref 70–99)
Potassium: 3.6 mEq/L (ref 3.5–5.1)
Sodium: 139 mEq/L (ref 135–145)

## 2017-02-12 LAB — HEMOGLOBIN A1C: Hgb A1c MFr Bld: 6.2 % (ref 4.6–6.5)

## 2017-02-12 LAB — MICROALBUMIN / CREATININE URINE RATIO
Creatinine,U: 101.9 mg/dL
Microalb Creat Ratio: 3.5 mg/g (ref 0.0–30.0)
Microalb, Ur: 3.6 mg/dL — ABNORMAL HIGH (ref 0.0–1.9)

## 2017-02-12 LAB — GLUCOSE, POCT (MANUAL RESULT ENTRY): POC Glucose: 141 mg/dl — AB (ref 70–99)

## 2017-02-12 NOTE — Progress Notes (Signed)
Pre visit review using our clinic review tool, if applicable. No additional management support is needed unless otherwise documented below in the visit note. 

## 2017-02-12 NOTE — Progress Notes (Signed)
Keith [name]  is a 66 year old married male nonsmoker comes in today for follow-up of hypertension diabetes  We increased his metformin to 250 mg in the morning and 500 prior to his evening meal. He is not checking his blood sugars at home. Blood sugar here today is 141 fasting. A1c December 2018 was 6.3.  We started lisinopril 5 mg daily for blood pressure control and renal protection. He said he took 3 doses and had to stop it because it made him fatigued.  He takes Zocor 20 mg daily but not an aspirin tablet. Advised take an aspirin tablet with his Zocor  He went to see the nutritionist weight is down 7 pounds.  BP off medication 132/82  BP 132/82 (BP Location: Left Arm)   Temp 98 F (36.7 C) (Oral)   Wt 283 lb (128.4 kg)   BMI 38.92 kg/m  General he is well-developed well-nourished male no acute distress  #1 diabetes type 2.......... check A1c  #2 hyperlipidemia......Marland Kitchen LDL 67. Continue Zocor 20 at an aspirin tablet

## 2017-02-12 NOTE — Patient Instructions (Addendum)
Continue diet and exercise program........... he lost 7 pounds..........Marland Kitchen good work  Do not take the lisinopril  Check a fasting blood sugar weekly at home  Labs today..........Marland Kitchen we will call you and that she no if there is any change her medication and when follow-up is appropriate  At an aspirin tablet to the Zocor,,,,,,,,,,,,, 81 mg baby daily

## 2017-07-29 ENCOUNTER — Telehealth: Payer: Self-pay | Admitting: Family Medicine

## 2017-07-29 NOTE — Telephone Encounter (Signed)
Received PA request for Sildenafil. PA submitted & pending. Key: Grier Mitts

## 2017-07-31 NOTE — Telephone Encounter (Signed)
Called pt left a message for pt to return my call in the office. 

## 2017-07-31 NOTE — Telephone Encounter (Addendum)
The prior authorization has been denied. Insurance does not cover medications with a diagnosis of erectile dysfunction.

## 2017-08-01 ENCOUNTER — Other Ambulatory Visit: Payer: Self-pay

## 2017-08-01 MED ORDER — SILDENAFIL CITRATE 20 MG PO TABS: ORAL_TABLET | ORAL | 10 refills | Status: DC

## 2017-08-01 NOTE — Telephone Encounter (Signed)
Spoke with pt stated that he is aware that his insurance will not pay for the Sildenafil, Patient states that he pays for this medication out of pocket.

## 2017-08-23 ENCOUNTER — Encounter: Payer: Self-pay | Admitting: Urgent Care

## 2017-08-23 ENCOUNTER — Ambulatory Visit (INDEPENDENT_AMBULATORY_CARE_PROVIDER_SITE_OTHER): Payer: BLUE CROSS/BLUE SHIELD | Admitting: Urgent Care

## 2017-08-23 VITALS — BP 142/86 | HR 76 | Temp 98.5°F | Resp 18 | Ht 71.5 in | Wt 286.2 lb

## 2017-08-23 DIAGNOSIS — H1131 Conjunctival hemorrhage, right eye: Secondary | ICD-10-CM | POA: Diagnosis not present

## 2017-08-23 NOTE — Progress Notes (Signed)
  MRN: [id number] DOB: 03-Oct-1951  Subjective:   Keith [name] is a 66 y.o. male presenting for 3 day history of right eye redness, itching, irritation. Has tried otc Visine for the past 3 days and stopped now. Denies fever, eye pain, photophobia. Had a cold ~10 days ago that is now resolved.  [place] has a current medication list which includes the following prescription(s): atenolol-chlorthalidone, clonidine, duloxetine, metformin, multivitamin with minerals, oxycontin, sildenafil, and simvastatin. Also is allergic to lisinopril.  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 Also  has a past surgical history that includes Tonsillectomy; Lumbar laminectomy; Nasal polyp surgery; and Esophagogastroduodenoscopy (N/A, 08/29/2014).  Objective:   Vitals: BP (!) 142/86   Pulse 76   Temp 98.5 F (36.9 C) (Oral)   Resp 18   Ht 5' 11.5" (1.816 m)   Wt 286 lb 3.2 oz (129.8 kg)   SpO2 98%   BMI 39.36 kg/m    Visual Acuity Screening   Right eye Left eye Both eyes  Without correction:     With correction: 20/20-1 20/20-1 20/40-1    Physical Exam  Constitutional: He is oriented to person, place, and time. He appears well-developed and well-nourished.  Eyes: EOM are normal. Pupils are equal, round, and reactive to light. Right eye exhibits no chemosis, no discharge, no exudate and no hordeolum. No foreign body present in the right eye. Left eye exhibits no chemosis, no discharge, no exudate and no hordeolum. No foreign body present in the left eye. Right conjunctiva is not injected. Right conjunctiva has a hemorrhage  (subconjunctival). Left conjunctiva is not injected. Left conjunctiva has no hemorrhage. No scleral icterus.    Cardiovascular: Normal rate.  Pulmonary/Chest: Effort normal.  Neurological: He is alert and oriented to person, place, and time.    Assessment and Plan :   1. Subconjunctival hemorrhage of right eye - Anticipatory guidance provided. Return-to-clinic precautions discussed, patient verbalized understanding.   Jaynee Eagles, PA-C [facility] [phone] 08/23/2017  10:00 AM

## 2017-08-23 NOTE — Patient Instructions (Addendum)
Subconjunctival Hemorrhage Subconjunctival hemorrhage is bleeding that happens between the white part of your eye (sclera) and the clear membrane that covers the outside of your eye (conjunctiva). There are many tiny blood vessels near the surface of your eye. A subconjunctival hemorrhage happens when one or more of these vessels breaks and bleeds, causing a red patch to appear on your eye. This is similar to a bruise. Depending on the amount of bleeding, the red patch may only cover a small area of your eye or it may cover the entire visible part of the sclera. If a lot of blood collects under the conjunctiva, there may also be swelling. Subconjunctival hemorrhages do not affect your vision or cause pain, but your eye may feel irritated if there is swelling. Subconjunctival hemorrhages usually do not require treatment, and they disappear on their own within two weeks. What are the causes? This condition may be caused by:  Mild trauma, such as rubbing your eye too hard.  Severe trauma or blunt injuries.  Coughing, sneezing, or vomiting.  Straining, such as when lifting a heavy object.  High blood pressure.  Recent eye surgery.  A history of diabetes.  Certain medicines, especially blood thinners (anticoagulants).  Other conditions, such as eye tumors, bleeding disorders, or blood vessel abnormalities.  Subconjunctival hemorrhages can happen without an obvious cause. What are the signs or symptoms? Symptoms of this condition include:  A bright red or dark red patch on the white part of the eye. ? The red area may spread out to cover a larger area of the eye before it goes away. ? The red area may turn brownish-yellow before it goes away.  Swelling.  Mild eye irritation.  How is this diagnosed? This condition is diagnosed with a physical exam. If your subconjunctival hemorrhage was caused by trauma, your health care provider may refer you to an eye specialist (ophthalmologist) or  another specialist to check for other injuries. You may have other tests, including:  An eye exam.  A blood pressure check.  Blood tests to check for bleeding disorders.  If your subconjunctival hemorrhage was caused by trauma, X-rays or a CT scan may be done to check for other injuries. How is this treated? Usually, no treatment is needed. Your health care provider may recommend eye drops or cold compresses to help with discomfort. Follow these instructions at home:  Take over-the-counter and prescription medicines only as directed by your health care provider.  Use eye drops or cold compresses to help with discomfort as directed by your health care provider.  Avoid activities, things, and environments that may irritate or injure your eye.  Keep all follow-up visits as told by your health care provider. This is important. Contact a health care provider if:  You have pain in your eye.  The bleeding does not go away within 3 weeks.  You keep getting new subconjunctival hemorrhages. Get help right away if:  Your vision changes or you have difficulty seeing.  You suddenly develop severe sensitivity to light.  You develop a severe headache, persistent vomiting, confusion, or abnormal tiredness (lethargy).  Your eye seems to bulge or protrude from your eye socket.  You develop unexplained bruises on your body.  You have unexplained bleeding in another area of your body. This information is not intended to replace advice given to you by your health care provider. Make sure you discuss any questions you have with your health care provider. Document Released: 09/24/2005 Document Revised: 05/20/2016 Document  Reviewed: 12/01/2014 Elsevier Interactive Patient Education  2018 [employer].     IF you received an x-ray today, you will receive an invoice from [facility]. Please contact [facility] at [phone] with questions or concerns regarding your invoice.    IF you received labwork today, you will receive an invoice from [facility]. Please contact [facility] at [phone] with questions or concerns regarding your invoice.   Our billing staff will not be able to assist you with questions regarding bills from these companies.  You will be contacted with the lab results as soon as they are available. The fastest way to get your results is to activate your My Chart account. Instructions are located on the last page of this paperwork. If you have not heard from [place] regarding the results in 2 weeks, please contact this office.

## 2017-11-11 ENCOUNTER — Encounter: Payer: Self-pay | Admitting: Family Medicine

## 2017-11-11 ENCOUNTER — Ambulatory Visit (INDEPENDENT_AMBULATORY_CARE_PROVIDER_SITE_OTHER): Payer: BLUE CROSS/BLUE SHIELD | Admitting: Family Medicine

## 2017-11-11 ENCOUNTER — Other Ambulatory Visit: Payer: Self-pay | Admitting: Family Medicine

## 2017-11-11 VITALS — BP 128/78 | HR 74 | Temp 97.8°F | Ht 71.0 in | Wt 277.0 lb

## 2017-11-11 DIAGNOSIS — Z6838 Body mass index (BMI) 38.0-38.9, adult: Secondary | ICD-10-CM

## 2017-11-11 DIAGNOSIS — Z23 Encounter for immunization: Secondary | ICD-10-CM | POA: Diagnosis not present

## 2017-11-11 DIAGNOSIS — Z0000 Encounter for general adult medical examination without abnormal findings: Secondary | ICD-10-CM

## 2017-11-11 DIAGNOSIS — M48062 Spinal stenosis, lumbar region with neurogenic claudication: Secondary | ICD-10-CM | POA: Diagnosis not present

## 2017-11-11 DIAGNOSIS — I10 Essential (primary) hypertension: Secondary | ICD-10-CM | POA: Diagnosis not present

## 2017-11-11 DIAGNOSIS — E785 Hyperlipidemia, unspecified: Secondary | ICD-10-CM | POA: Diagnosis not present

## 2017-11-11 DIAGNOSIS — F528 Other sexual dysfunction not due to a substance or known physiological condition: Secondary | ICD-10-CM

## 2017-11-11 DIAGNOSIS — E119 Type 2 diabetes mellitus without complications: Secondary | ICD-10-CM | POA: Diagnosis not present

## 2017-11-11 DIAGNOSIS — E1169 Type 2 diabetes mellitus with other specified complication: Secondary | ICD-10-CM | POA: Diagnosis not present

## 2017-11-11 DIAGNOSIS — Z125 Encounter for screening for malignant neoplasm of prostate: Secondary | ICD-10-CM

## 2017-11-11 DIAGNOSIS — E6609 Other obesity due to excess calories: Secondary | ICD-10-CM | POA: Diagnosis not present

## 2017-11-11 LAB — BASIC METABOLIC PANEL
BUN: 19 mg/dL (ref 6–23)
CO2: 26 mEq/L (ref 19–32)
Calcium: 9.7 mg/dL (ref 8.4–10.5)
Chloride: 104 mEq/L (ref 96–112)
Creatinine, Ser: 0.99 mg/dL (ref 0.40–1.50)
GFR: 97.47 mL/min (ref >60.00)
Glucose, Bld: 137 mg/dL — ABNORMAL HIGH (ref 70–99)
Potassium: 3.6 mEq/L (ref 3.5–5.1)
Sodium: 140 mEq/L (ref 135–145)

## 2017-11-11 LAB — POCT URINALYSIS DIPSTICK
Bilirubin, UA: NEGATIVE
Blood, UA: NEGATIVE
Glucose, UA: NEGATIVE
Ketones, UA: NEGATIVE
Leukocytes, UA: NEGATIVE
Nitrite, UA: NEGATIVE
Odor: NEGATIVE
Spec Grav, UA: 1.02 (ref 1.010–1.025)
Urobilinogen, UA: 0.2 E.U./dL
pH, UA: 7 (ref 5.0–8.0)

## 2017-11-11 LAB — CBC WITH DIFFERENTIAL/PLATELET
Basophils Absolute: 0 10*3/uL (ref 0.0–0.1)
Basophils Relative: 0.3 % (ref 0.0–3.0)
Eosinophils Absolute: 0.4 10*3/uL (ref 0.0–0.7)
Eosinophils Relative: 7.2 % — ABNORMAL HIGH (ref 0.0–5.0)
HCT: 40.4 % (ref 39.0–52.0)
Hemoglobin: 13.6 g/dL (ref 13.0–17.0)
Lymphocytes Relative: 20 % (ref 12.0–46.0)
Lymphs Abs: 1.1 10*3/uL (ref 0.7–4.0)
MCHC: 33.6 g/dL (ref 30.0–36.0)
MCV: 97 fl (ref 78.0–100.0)
Monocytes Absolute: 0.6 10*3/uL (ref 0.1–1.0)
Monocytes Relative: 11.7 % (ref 3.0–12.0)
Neutro Abs: 3.2 10*3/uL (ref 1.4–7.7)
Neutrophils Relative %: 60.8 % (ref 43.0–77.0)
Platelets: 265 10*3/uL (ref 150.0–400.0)
RBC: 4.16 Mil/uL — ABNORMAL LOW (ref 4.22–5.81)
RDW: 13.7 % (ref 11.5–15.5)
WBC: 5.3 10*3/uL (ref 4.0–10.5)

## 2017-11-11 LAB — HEPATIC FUNCTION PANEL
ALT: 25 U/L (ref 0–53)
AST: 30 U/L (ref 0–37)
Albumin: 4.3 g/dL (ref 3.5–5.2)
Alkaline Phosphatase: 72 U/L (ref 39–117)
Bilirubin, Direct: 0.1 mg/dL (ref 0.0–0.3)
Total Bilirubin: 0.3 mg/dL (ref 0.2–1.2)
Total Protein: 8.1 g/dL (ref 6.0–8.3)

## 2017-11-11 LAB — LIPID PANEL
Cholesterol: 142 mg/dL (ref 0–200)
HDL: 50.7 mg/dL (ref >39.00)
LDL Cholesterol: 70 mg/dL (ref 0–99)
NonHDL: 90.83
Total CHOL/HDL Ratio: 3
Triglycerides: 106 mg/dL (ref 0.0–149.0)
VLDL: 21.2 mg/dL (ref 0.0–40.0)

## 2017-11-11 LAB — MICROALBUMIN / CREATININE URINE RATIO
Creatinine,U: 100.6 mg/dL
Microalb Creat Ratio: 5.9 mg/g (ref 0.0–30.0)
Microalb, Ur: 5.9 mg/dL — ABNORMAL HIGH (ref 0.0–1.9)

## 2017-11-11 LAB — TSH: TSH: 0.8 u[IU]/mL (ref 0.35–4.50)

## 2017-11-11 LAB — PSA: PSA: 0.53 ng/mL (ref 0.10–4.00)

## 2017-11-11 LAB — HEMOGLOBIN A1C: Hgb A1c MFr Bld: 6.3 % (ref 4.6–6.5)

## 2017-11-11 MED ORDER — SILDENAFIL CITRATE 20 MG PO TABS: ORAL_TABLET | ORAL | 10 refills | Status: DC

## 2017-11-11 MED ORDER — SIMVASTATIN 20 MG PO TABS: 20.0000 mg | ORAL_TABLET | Freq: Every day | ORAL | 4 refills | Status: DC

## 2017-11-11 MED ORDER — ATENOLOL-CHLORTHALIDONE 50-25 MG PO TABS: 1.0000 | ORAL_TABLET | Freq: Every day | ORAL | 4 refills | Status: DC

## 2017-11-11 MED ORDER — CLONIDINE HCL 0.1 MG PO TABS: ORAL_TABLET | ORAL | 4 refills | Status: DC

## 2017-11-11 NOTE — Progress Notes (Signed)
Keith [name]  is a 67 year old married male nonsmoker who comes in today for annual physical examination because of a history of obesity, hypertension, diabetes type 2, hyperlipidemia, and chronic low back pain  His major problem is his underlying weight. In the fall he weight 286 pounds. He's dropped 9 pounds since that time. Encouraged him to continue to lose weight  His blood sugars under good control.  He takes metformin 250 mg tabs daily. Last went A1c in June was 6.2%  For hypertension he takes Tenoretic 50-25, Catapres 0.1 daily at bedtime and BP today is 120/78  He sees Dr. Assunta Curtis in pain control for chronic low back pain. He has surgery in 2014. He was pain-free for about a year then the pain recurred. With Dr. Dellis Filbert helping his decrease his pain by about 80% and is tapering off his narcotics. Dr. Assunta Curtis also has him on Cymbalta 30 mg at bedtime.  He takes Zocor 20 mg daily for hyperlipidemia  He's a generic Viagra for ED  He has a deformity of his left foot which makes walking difficult. Encouraged to join the [employer]  He does not get routine eye care.......Marland Kitchen recommend annual eye exam, does get regular dental care, do a colonoscopy last year. He says he never got a recall card.  Vaccinations up-to-date except information given on shingles and a Prevnar given today.  Social history......Marland Kitchen married lives here in [place] works 1 PM to 11 PM at [employer]....... he manages a computer systems  14 point review of systems otherwise negative  EKG was done because of a history of above. EKG was normal unchanged  BP 128/78 (BP Location: Left Arm, Patient Position: Sitting, Cuff Size: Large)   Pulse 74   Temp 97.8 F (36.6 C) (Oral)   Ht 5\' 11"  (1.803 m)   Wt 277 lb (125.6 kg)   BMI 38.63 kg/m  Examination of the HEENT were negative except for early bilateral cataracts. Neck was supple thyroid not enlarged no carotid bruits cardiopulmonary exam normal abdominal exam normal genitalia normal  circumcised male rectum normal stool guaiac-negative prostate smooth 1+ nonnodular BPH........ admits to nocturia 1....... extremities normal skin normal peripheral pulses normal except for deformity of left ankle chronic  #1 hypertension at goal.....Marland Kitchen continue current therapy  #2 diabetes type 2 at goal......... check labs  #3 hyperlipidemia......... continue Zocor and aspirin check labs  #4 obesity...........Marland Kitchen encouraged to continue weight loss  #5 chronic low back pain followed at the pain clinic by Dr. Assunta Curtis  #6 ED....... continue generic Viagra

## 2017-11-11 NOTE — Patient Instructions (Signed)
Congratulations on losing 9 pounds.............. continue the good work  Consider joining the [employer]......... stationary bike 30 minutes daily  Continue current medications  Labs today.............. I will call you if there is anything abnormal and we will let you know when you due to come back for follow-up A1c  Call your ophthalmologist for routine eye care  Call your insurance company and find out where he gets a new shingles vaccine  GI we'll call you about the follow-up colonoscopy  I would recommend both you and your wife go to the nutrition center

## 2017-11-12 ENCOUNTER — Encounter: Payer: Self-pay | Admitting: Gastroenterology

## 2017-11-14 ENCOUNTER — Other Ambulatory Visit: Payer: Self-pay

## 2017-11-14 DIAGNOSIS — R7309 Other abnormal glucose: Secondary | ICD-10-CM

## 2018-01-06 ENCOUNTER — Other Ambulatory Visit: Payer: Self-pay

## 2018-01-06 ENCOUNTER — Ambulatory Visit (AMBULATORY_SURGERY_CENTER): Payer: Self-pay | Admitting: *Deleted

## 2018-01-06 VITALS — Ht 72.0 in | Wt 278.0 lb

## 2018-01-06 DIAGNOSIS — Z860100 Personal history of colon polyps, unspecified: Secondary | ICD-10-CM

## 2018-01-06 MED ORDER — NA SULFATE-K SULFATE-MG SULF 17.5-3.13-1.6 GM/177ML PO SOLN: ORAL | 0 refills | Status: DC

## 2018-01-06 NOTE — Progress Notes (Signed)
Patient denies any allergies to eggs or soy. Patient denies any problems with anesthesia/sedation. Patient denies any oxygen use at home. Patient denies taking any diet/weight loss medications or blood thinners. EMMI education assisgned to patient on colonoscopy, this was explained and instructions given to patient. 

## 2018-01-20 ENCOUNTER — Encounter: Payer: Self-pay | Admitting: Gastroenterology

## 2018-01-20 ENCOUNTER — Ambulatory Visit (AMBULATORY_SURGERY_CENTER): Payer: BLUE CROSS/BLUE SHIELD | Admitting: Gastroenterology

## 2018-01-20 ENCOUNTER — Other Ambulatory Visit: Payer: Self-pay

## 2018-01-20 VITALS — BP 120/68 | HR 61 | Temp 98.4°F | Resp 21 | Ht 72.0 in | Wt 278.0 lb

## 2018-01-20 DIAGNOSIS — K633 Ulcer of intestine: Secondary | ICD-10-CM

## 2018-01-20 DIAGNOSIS — D123 Benign neoplasm of transverse colon: Secondary | ICD-10-CM

## 2018-01-20 DIAGNOSIS — Z86010 Personal history of colonic polyps: Secondary | ICD-10-CM

## 2018-01-20 MED ORDER — SODIUM CHLORIDE 0.9 % IV SOLN: 500.0000 mL | Freq: Once | INTRAVENOUS | Status: DC

## 2018-01-20 NOTE — Progress Notes (Signed)
Called to room to assist during endoscopic procedure.  Patient ID and intended procedure confirmed with present staff. Received instructions for my participation in the procedure from the performing physician.  

## 2018-01-20 NOTE — Progress Notes (Signed)
Pt's states no medical or surgical changes since previsit or office visit. 

## 2018-01-20 NOTE — Progress Notes (Signed)
To PACU, VSS. Report to [email or web address] 

## 2018-01-20 NOTE — Patient Instructions (Signed)
*  Handouts given to patient on polyps, diverticulosis and hemorrhoids  YOU HAD AN ENDOSCOPIC PROCEDURE TODAY AT [facility]:   Refer to the procedure report that was given to you for any specific questions about what was found during the examination.  If the procedure report does not answer your questions, please call your gastroenterologist to clarify.  If you requested that your care partner not be given the details of your procedure findings, then the procedure report has been included in a sealed envelope for you to review at your convenience later.  YOU SHOULD EXPECT: Some feelings of bloating in the abdomen. Passage of more gas than usual.  Walking can help get rid of the air that was put into your GI tract during the procedure and reduce the bloating. If you had a lower endoscopy (such as a colonoscopy or flexible sigmoidoscopy) you may notice spotting of blood in your stool or on the toilet paper. If you underwent a bowel prep for your procedure, you may not have a normal bowel movement for a few days.  Please Note:  You might notice some irritation and congestion in your nose or some drainage.  This is from the oxygen used during your procedure.  There is no need for concern and it should clear up in a day or so.  SYMPTOMS TO REPORT IMMEDIATELY:   Following lower endoscopy (colonoscopy or flexible sigmoidoscopy):  Excessive amounts of blood in the stool  Significant tenderness or worsening of abdominal pains  Swelling of the abdomen that is new, acute  Fever of 100F or higher   For urgent or emergent issues, a gastroenterologist can be reached at any hour by calling [phone].   DIET:  We do recommend a small meal at first, but then you may proceed to your regular diet.  Drink plenty of fluids but you should avoid alcoholic beverages for 24 hours.  ACTIVITY:  You should plan to take it easy for the rest of today and you should NOT DRIVE or use heavy machinery  until tomorrow (because of the sedation medicines used during the test).    FOLLOW UP: Our staff will call the number listed on your records the next business day following your procedure to check on you and address any questions or concerns that you may have regarding the information given to you following your procedure. If we do not reach you, we will leave a message.  However, if you are feeling well and you are not experiencing any problems, there is no need to return our call.  We will assume that you have returned to your regular daily activities without incident.  If any biopsies were taken you will be contacted by phone or by letter within the next 1-3 weeks.  Please call us at [phone] if you have not heard about the biopsies in 3 weeks.    SIGNATURES/CONFIDENTIALITY: You and/or your care partner have signed paperwork which will be entered into your electronic medical record.  These signatures attest to the fact that that the information above on your After Visit Summary has been reviewed and is understood.  Full responsibility of the confidentiality of this discharge information lies with you and/or your care-partner.

## 2018-01-20 NOTE — Op Note (Signed)
[facility] Patient Name: Keith [name] Procedure Date: 01/20/2018 9:49 AM MRN: [id number] Endoscopist: Ladene Artist , MD Age: 67 Referring MD:  Date of Birth: [birth year] Gender: Male Account #: [id number] Procedure:                Colonoscopy Indications:              Surveillance: Personal history of adenomatous                            polyps on last colonoscopy > 5 years ago Medicines:                Monitored Anesthesia Care Procedure:                Pre-Anesthesia Assessment:                           - Prior to the procedure, a History and Physical                            was performed, and patient medications and                            allergies were reviewed. The patient's tolerance of                            previous anesthesia was also reviewed. The risks                            and benefits of the procedure and the sedation                            options and risks were discussed with the patient.                            All questions were answered, and informed consent                            was obtained. Prior Anticoagulants: The patient has                            taken no previous anticoagulant or antiplatelet                            agents. ASA Grade Assessment: II - A patient with                            mild systemic disease. After reviewing the risks                            and benefits, the patient was deemed in                            satisfactory condition to undergo the procedure.  After obtaining informed consent, the colonoscope                            was passed under direct vision. Throughout the                            procedure, the patient's blood pressure, pulse, and                            oxygen saturations were monitored continuously. The                            Colonoscope was introduced through the anus and                            advanced to the the  cecum, identified by                            appendiceal orifice and ileocecal valve. The                            ileocecal valve, appendiceal orifice, and rectum                            were photographed. The quality of the bowel                            preparation was good. The colonoscopy was performed                            without difficulty. The patient tolerated the                            procedure well. Scope In: 9:58:18 AM Scope Out: 10:12:33 AM Scope Withdrawal Time: 0 hours 10 minutes 13 seconds  Total Procedure Duration: 0 hours 14 minutes 15 seconds  Findings:                 The perianal and digital rectal examinations were                            normal.                           A 8 mm polyp was found in the mid transverse colon.                            The polyp was sessile. The polyp was removed with a                            cold snare. Resection and retrieval were complete.                           A single (solitary) ten mm ulcer was found in the  distal transverse colon. No bleeding was present.                            No stigmata of recent bleeding were seen. Biopsies                            were taken with a cold forceps for histology.                           Many medium-mouthed diverticula were found in the                            left colon. There was no evidence of diverticular                            bleeding.                           Internal hemorrhoids were found during                            retroflexion. The hemorrhoids were medium-sized and                            Grade I (internal hemorrhoids that do not prolapse).                           The exam was otherwise without abnormality on                            direct and retroflexion views. Complications:            No immediate complications. Estimated blood loss:                            None. Estimated Blood Loss:      Estimated blood loss: none. Impression:               - One 8 mm polyp in the mid transverse colon,                            removed with a cold snare. Resected and retrieved.                           - A single (solitary) ulcer in the distal                            transverse colon. Biopsied.                           - Moderate diverticulosis in the left colon. There                            was no evidence of diverticular bleeding.                           -  Internal hemorrhoids.                           - The examination was otherwise normal on direct                            and retroflexion views. Recommendation:           - Repeat colonoscopy in 5 years for surveillance.                           - Patient has a contact number available for                            emergencies. The signs and symptoms of potential                            delayed complications were discussed with the                            patient. Return to normal activities tomorrow.                            Written discharge instructions were provided to the                            patient.                           - High fiber diet.                           - Continue present medications.                           - Await pathology results. Ladene Artist, MD 01/20/2018 10:16:05 AM This report has been signed electronically.

## 2018-01-21 ENCOUNTER — Telehealth: Payer: Self-pay

## 2018-01-21 NOTE — Telephone Encounter (Signed)
NO ANSWER, MESSAGE LEFT FOR PATIENT. 

## 2018-01-21 NOTE — Telephone Encounter (Signed)
  Follow up Call-  Call back number 01/20/2018  Post procedure Call Back phone  # [phone]  Permission to leave phone message Yes  Some recent data might be hidden     No ID on answering machine.  No message left.  Will attempt again this afternoon. Angela/Call-back

## 2018-01-24 ENCOUNTER — Encounter: Payer: Self-pay | Admitting: Gastroenterology

## 2018-02-09 ENCOUNTER — Other Ambulatory Visit: Payer: Self-pay | Admitting: Family Medicine

## 2018-03-17 ENCOUNTER — Ambulatory Visit: Payer: BLUE CROSS/BLUE SHIELD | Admitting: Physician Assistant

## 2018-03-17 ENCOUNTER — Other Ambulatory Visit: Payer: Self-pay

## 2018-03-17 ENCOUNTER — Encounter: Payer: Self-pay | Admitting: Physician Assistant

## 2018-03-17 VITALS — BP 128/70 | HR 72 | Temp 98.0°F | Resp 18 | Ht 72.0 in | Wt 279.6 lb

## 2018-03-17 DIAGNOSIS — J0100 Acute maxillary sinusitis, unspecified: Secondary | ICD-10-CM | POA: Diagnosis not present

## 2018-03-17 MED ORDER — AMOXICILLIN-POT CLAVULANATE 875-125 MG PO TABS: 1.0000 | ORAL_TABLET | Freq: Two times a day (BID) | ORAL | 0 refills | Status: AC

## 2018-03-17 NOTE — Progress Notes (Signed)
MRN: [id number] DOB: Oct 14, 1950  Subjective:   Keith [name] is a 67 y.o. male presenting for chief complaint of Sinus Problem (alots of green bad odor smelling mucus ) .  Reports 1 month history of sinus congestion and rhinorrhea . Having thick green mucous drainage. Has not tried anything for relief. Denies fever, ear pain, sore throat, wheezing, shortness of breath, chest pain and cough, nausea, vomiting, abdominal pain, diarrhea and visual disturbance. Has not had sick contact with anyone. Prior history of seasonal allergies, but has not had issues in years. No history of asthma. Former smoker. PSH of nasal polyp removal years ago. Has hx of sinusitis, has not had issues in about 2 years.  Denies any other aggravating or relieving factors, no other questions or concerns.  Keith [name] has a current medication list which includes the following prescription(s): atenolol-chlorthalidone, clonidine, duloxetine, metformin, multivitamin with minerals, oxycontin, sildenafil, simvastatin, and amoxicillin-clavulanate, and the following Facility-Administered Medications: sodium chloride. Also is allergic to lisinopril.  Keith [name]  has a past medical history of Allergy, Chronic back pain, Diabetes mellitus without complication ([place]), Duodenal ulcer hemorrhage (08/29/2014), ED (erectile dysfunction), Esophageal stricture (08/30/2014), Glucose intolerance (impaired glucose tolerance), Hiatal hernia (08/30/2014), Hypertension, Hypokalemia (04/11/2013), MRSA carrier (08/30/2014), Obesity, Osteoarthritis, Scoliosis (2016), Spinal stenosis of lumbar region, and Urinary tract infection (04/11/2013). Also  has a past surgical history that includes Tonsillectomy; Lumbar laminectomy; Nasal polyp surgery; Esophagogastroduodenoscopy (N/A, 08/29/2014); and Colonoscopy (2008).   Objective:   Vitals: BP 128/70   Pulse 72   Temp 98 F (36.7 C) (Oral)   Resp 18   Ht 6' (1.829 m)   Wt 279 lb 9.6 oz (126.8 kg)   SpO2  98%   BMI 37.92 kg/m   Physical Exam  Constitutional: He is oriented to person, place, and time. He appears well-developed and well-nourished. No distress.  HENT:  Head: Normocephalic and atraumatic.  Right Ear: Tympanic membrane, external ear and ear canal normal.  Left Ear: Tympanic membrane, external ear and ear canal normal.  Nose: Mucosal edema ( moderate b/l, thick bright green sputum noted in left nasal cavity) and rhinorrhea present. Right sinus exhibits maxillary sinus tenderness ( mild). Right sinus exhibits no frontal sinus tenderness. Left sinus exhibits maxillary sinus tenderness ( mild). Left sinus exhibits no frontal sinus tenderness.  Mouth/Throat: Uvula is midline and mucous membranes are normal. No posterior oropharyngeal edema, posterior oropharyngeal erythema or tonsillar abscesses. No tonsillar exudate.  Eyes: Conjunctivae are normal.  Neck: Normal range of motion.  Lymphadenopathy:       Head (right side): No submental, no submandibular, no tonsillar, no preauricular, no posterior auricular and no occipital adenopathy present.       Head (left side): No submental, no submandibular, no tonsillar, no preauricular, no posterior auricular and no occipital adenopathy present.    He has no cervical adenopathy.       Right: No supraclavicular adenopathy present.       Left: No supraclavicular adenopathy present.  Neurological: He is alert and oriented to person, place, and time.  Skin: Skin is warm and dry.  Psychiatric: He has a normal mood and affect.  Vitals reviewed.   No results found for this or any previous visit (from the past 24 hour(s)).  Assessment and Plan :  1. Acute non-recurrent maxillary sinusitis Due to duration, will tx for bacterial etiology at this time. Also recommended daily OTC nasal saline rinses. Encouraged to return to clinic if symptoms worsen, do not improve,  or as needed.  - amoxicillin-clavulanate (AUGMENTIN) 875-125 MG tablet; Take 1 tablet  by mouth 2 (two) times daily for 7 days.  Dispense: 14 tablet; Refill: 0  Tenna Delaine, PA-C  Primary Care at [facility] 03/17/2018 10:21 AM

## 2018-03-17 NOTE — Patient Instructions (Addendum)
Please take antibiotic as prescribed. I also recommend daily nasal saline washes. You can purchase over the counter Saline Nasal Spray, just ask the pharmacist where to get it. It will be in a squirt bottle. Return to clinic if symptoms worsen, do not improve, or as needed.   Sinusitis, Adult Sinusitis is soreness and inflammation of your sinuses. Sinuses are hollow spaces in the bones around your face. They are located:  Around your eyes.  In the middle of your forehead.  Behind your nose.  In your cheekbones.  Your sinuses and nasal passages are lined with a stringy fluid (mucus). Mucus normally drains out of your sinuses. When your nasal tissues get inflamed or swollen, the mucus can get trapped or blocked so air cannot flow through your sinuses. This lets bacteria, viruses, and funguses grow, and that leads to infection. Follow these instructions at home: Medicines  Take, use, or apply over-the-counter and prescription medicines only as told by your doctor. These may include nasal sprays.  If you were prescribed an antibiotic medicine, take it as told by your doctor. Do not stop taking the antibiotic even if you start to feel better. Hydrate and Humidify  Drink enough water to keep your pee (urine) clear or pale yellow.  Use a cool mist humidifier to keep the humidity level in your home above 50%.  Breathe in steam for 10-15 minutes, 3-4 times a day or as told by your doctor. You can do this in the bathroom while a hot shower is running.  Try not to spend time in cool or dry air. Rest  Rest as much as possible.  Sleep with your head raised (elevated).  Make sure to get enough sleep each night. General instructions  Put a warm, moist washcloth on your face 3-4 times a day or as told by your doctor. This will help with discomfort.  Wash your hands often with soap and water. If there is no soap and water, use hand sanitizer.  Do not smoke. Avoid being around people who are  smoking (secondhand smoke).  Keep all follow-up visits as told by your doctor. This is important. Contact a doctor if:  You have a fever.  Your symptoms get worse.  Your symptoms do not get better within 10 days. Get help right away if:  You have a very bad headache.  You cannot stop throwing up (vomiting).  You have pain or swelling around your face or eyes.  You have trouble seeing.  You feel confused.  Your neck is stiff.  You have trouble breathing. This information is not intended to replace advice given to you by your health care provider. Make sure you discuss any questions you have with your health care provider. Document Released: 03/12/2008 Document Revised: 05/20/2016 Document Reviewed: 07/20/2015 Elsevier Interactive Patient Education  2018 [employer].     IF you received an x-ray today, you will receive an invoice from [facility]. Please contact [facility] at [phone] with questions or concerns regarding your invoice.   IF you received labwork today, you will receive an invoice from [facility]. Please contact [facility] at [phone] with questions or concerns regarding your invoice.   Our billing staff will not be able to assist you with questions regarding bills from these companies.  You will be contacted with the lab results as soon as they are available. The fastest way to get your results is to activate your My Chart account. Instructions are located on the last page of  this paperwork. If you have not heard from [place] regarding the results in 2 weeks, please contact this office.

## 2018-05-08 ENCOUNTER — Encounter: Payer: Self-pay | Admitting: Adult Health

## 2018-05-08 ENCOUNTER — Ambulatory Visit (INDEPENDENT_AMBULATORY_CARE_PROVIDER_SITE_OTHER): Payer: BLUE CROSS/BLUE SHIELD | Admitting: Adult Health

## 2018-05-08 VITALS — BP 124/70 | Temp 98.6°F | Wt 267.0 lb

## 2018-05-08 DIAGNOSIS — R350 Frequency of micturition: Secondary | ICD-10-CM

## 2018-05-08 LAB — POC URINALSYSI DIPSTICK (AUTOMATED)
Bilirubin, UA: NEGATIVE
Glucose, UA: NEGATIVE
Ketones, UA: NEGATIVE
Nitrite, UA: NEGATIVE
Protein, UA: POSITIVE — AB
Spec Grav, UA: 1.015 (ref 1.010–1.025)
Urobilinogen, UA: 0.2 E.U./dL
pH, UA: 6 (ref 5.0–8.0)

## 2018-05-08 MED ORDER — AMOXICILLIN-POT CLAVULANATE 875-125 MG PO TABS: 1.0000 | ORAL_TABLET | Freq: Two times a day (BID) | ORAL | 0 refills | Status: AC

## 2018-05-08 NOTE — Progress Notes (Signed)
Subjective:    Patient ID: Keith [name], male    DOB: 02/05/1951, 67 y.o.   MRN: [id number]  HPI  67 year old male who  has a past medical history of Allergy, Chronic back pain, Diabetes mellitus without complication ([place]), Duodenal ulcer hemorrhage (08/29/2014), ED (erectile dysfunction), Esophageal stricture (08/30/2014), Glucose intolerance (impaired glucose tolerance), Hiatal hernia (08/30/2014), Hypertension, Hypokalemia (04/11/2013), MRSA carrier (08/30/2014), Obesity, Osteoarthritis, Scoliosis (2016), Spinal stenosis of lumbar region, and Urinary tract infection (04/11/2013).  He presents to the office today for concern for UTI. He reports that over the last 7-10 days he has been experiencing dysuria, urinary frequency, loss of appetite, nausea, and generalized fatigue. He does report one day of fever up to 101. His symptoms have been becoming progressively worse as the week has progressed.   He denies hematuria, urgency, abdominal pain, low back pain or new sexual partners.   Review of Systems See HPI   Past Medical History:  Diagnosis Date  . Allergy   . Chronic back pain   . Diabetes mellitus without complication ([place])   . Duodenal ulcer hemorrhage 08/29/2014  . ED (erectile dysfunction)   . Esophageal stricture 08/30/2014  . Glucose intolerance (impaired glucose tolerance)   . Hiatal hernia 08/30/2014  . Hypertension   . Hypokalemia 04/11/2013  . 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 Obesity   . Osteoarthritis   . Scoliosis 2016  . Spinal stenosis of lumbar region   . Urinary tract infection 04/11/2013    Social History   Socioeconomic History  . Marital status: Married    Spouse name: Not on file  . Number of children: Not on file  . Years of education: Not on file  . Highest education level: Not on file  Occupational History  . Not on file  Social Needs  . Financial resource strain: Not on file  . Food insecurity:    Worry: Not on file    Inability: Not on  file  . Transportation needs:    Medical: Not on file    Non-medical: Not on file  Tobacco Use  . Smoking status: Former Smoker    Packs/day: 1.00    Years: 25.00    Pack years: 25.00  . Smokeless tobacco: Never Used  Substance and Sexual Activity  . Alcohol use: Not Currently    Comment: Christmas/4th of July  . Drug use: No  . Sexual activity: Yes    Birth control/protection: None  Lifestyle  . Physical activity:    Days per week: Not on file    Minutes per session: Not on file  . Stress: Not on file  Relationships  . Social connections:    Talks on phone: Not on file    Gets together: Not on file    Attends religious service: Not on file    Active member of club or organization: Not on file    Attends meetings of clubs or organizations: Not on file    Relationship status: Not on file  . Intimate partner violence:    Fear of current or ex partner: Not on file    Emotionally abused: Not on file    Physically abused: Not on file    Forced sexual activity: Not on file  Other Topics Concern  . Not on file  Social History Narrative   Married.      Past Surgical History:  Procedure Laterality Date  . COLONOSCOPY  2008  . ESOPHAGOGASTRODUODENOSCOPY N/A 08/29/2014   Procedure:  ESOPHAGOGASTRODUODENOSCOPY (EGD);  Surgeon: Lafayette Dragon, MD;  Location: [facility];  Service: Endoscopy;  Laterality: N/A;  . LUMBAR LAMINECTOMY    . NASAL POLYP SURGERY    . TONSILLECTOMY      Family History  Problem Relation Age of Onset  . Diabetes Mother   . Asthma Mother   . Hypertension Father   . Stroke Father   . Colon cancer Neg Hx     Allergies  Allergen Reactions  . Lisinopril Other (See Comments)    Body Aches    Current Outpatient Medications on File Prior to Visit  Medication Sig Dispense Refill  . atenolol-chlorthalidone (TENORETIC) 50-25 MG tablet Take 1 tablet by mouth daily. 100 tablet 4  . cloNIDine (CATAPRES) 0.1 MG tablet TAKE 1 TABLET (0.1 MG TOTAL) BY  MOUTH DAILY. 100 tablet 4  . DULoxetine (CYMBALTA) 30 MG capsule Take 1 tablet once daily    . metFORMIN (GLUCOPHAGE) 500 MG tablet TAKE 1/2 TABLET BY MOUTH IN THE MORNING 100 tablet 2  . Multiple Vitamin (MULITIVITAMIN WITH MINERALS) TABS Take 1 tablet by mouth daily.    . OXYCONTIN 10 MG 12 hr tablet Take 1 tablet every 8 hours PRN    . sildenafil (REVATIO) 20 MG tablet 1-2 tablets 2-3 hours prior to sex 30 tablet 10  . simvastatin (ZOCOR) 20 MG tablet Take 1 tablet (20 mg total) by mouth at bedtime. 90 tablet 4   Current Facility-Administered Medications on File Prior to Visit  Medication Dose Route Frequency Provider Last Rate Last Dose  . 0.9 %  sodium chloride infusion  500 mL Intravenous Once Lucio Edward T, MD        BP 124/70   Temp 98.6 F (37 C) (Oral)   Wt 267 lb (121.1 kg)   BMI 36.21 kg/m       Objective:   Physical Exam  Constitutional: He is oriented to person, place, and time. He appears well-developed and well-nourished. No distress.  Cardiovascular: Normal rate.  Pulmonary/Chest: Effort normal and breath sounds normal.  Abdominal: Soft. Bowel sounds are normal. He exhibits no distension and no mass. There is no tenderness. There is no rebound, no guarding and no CVA tenderness. No hernia.  Neurological: He is alert and oriented to person, place, and time.  Skin: Skin is warm and dry. He is not diaphoretic.  Psychiatric: He has a normal mood and affect. His behavior is normal. Judgment and thought content normal.  Nursing note and vitals reviewed.     Assessment & Plan:  1. Urinary frequency  - POCT Urinalysis Dipstick (Automated) + leuks, protein, and blood. Negative nitrites.  - Will treat due to symptoms and sent urine culture  - amoxicillin-clavulanate (AUGMENTIN) 875-125 MG tablet; Take 1 tablet by mouth 2 (two) times daily for 7 days.  Dispense: 14 tablet; Refill: 0 - Urine Culture  Dorothyann Peng, NP

## 2018-05-11 LAB — URINE CULTURE
MICRO NUMBER:: 90910954
SPECIMEN QUALITY:: ADEQUATE

## 2018-06-03 ENCOUNTER — Other Ambulatory Visit (INDEPENDENT_AMBULATORY_CARE_PROVIDER_SITE_OTHER): Payer: BLUE CROSS/BLUE SHIELD

## 2018-06-03 DIAGNOSIS — R7309 Other abnormal glucose: Secondary | ICD-10-CM

## 2018-06-03 LAB — HEMOGLOBIN A1C: Hgb A1c MFr Bld: 6.6 % — ABNORMAL HIGH (ref 4.6–6.5)

## 2018-07-25 ENCOUNTER — Inpatient Hospital Stay (HOSPITAL_COMMUNITY)
Admission: EM | Admit: 2018-07-25 | Discharge: 2018-07-31 | DRG: 519 | Disposition: A | Discharge status: Home / Self Care | Payer: BLUE CROSS/BLUE SHIELD | Attending: Neurological Surgery | Admitting: Neurological Surgery

## 2018-07-25 ENCOUNTER — Encounter (HOSPITAL_COMMUNITY): Payer: Self-pay | Admitting: Emergency Medicine

## 2018-07-25 ENCOUNTER — Ambulatory Visit (HOSPITAL_COMMUNITY)
Admission: EM | Admit: 2018-07-25 | Discharge: 2018-07-25 | Disposition: A | Discharge status: Home / Self Care | Payer: BLUE CROSS/BLUE SHIELD | Source: Home / Self Care

## 2018-07-25 ENCOUNTER — Emergency Department (HOSPITAL_COMMUNITY): Payer: BLUE CROSS/BLUE SHIELD

## 2018-07-25 DIAGNOSIS — R531 Weakness: Secondary | ICD-10-CM | POA: Diagnosis present

## 2018-07-25 DIAGNOSIS — Z8711 Personal history of peptic ulcer disease: Secondary | ICD-10-CM

## 2018-07-25 DIAGNOSIS — R29898 Other symptoms and signs involving the musculoskeletal system: Secondary | ICD-10-CM

## 2018-07-25 DIAGNOSIS — Z7984 Long term (current) use of oral hypoglycemic drugs: Secondary | ICD-10-CM

## 2018-07-25 DIAGNOSIS — N39490 Overflow incontinence: Secondary | ICD-10-CM | POA: Diagnosis present

## 2018-07-25 DIAGNOSIS — M48061 Spinal stenosis, lumbar region without neurogenic claudication: Secondary | ICD-10-CM | POA: Diagnosis present

## 2018-07-25 DIAGNOSIS — M5104 Intervertebral disc disorders with myelopathy, thoracic region: Secondary | ICD-10-CM

## 2018-07-25 DIAGNOSIS — I10 Essential (primary) hypertension: Secondary | ICD-10-CM | POA: Diagnosis present

## 2018-07-25 DIAGNOSIS — R159 Full incontinence of feces: Secondary | ICD-10-CM | POA: Diagnosis present

## 2018-07-25 DIAGNOSIS — Z87891 Personal history of nicotine dependence: Secondary | ICD-10-CM | POA: Diagnosis not present

## 2018-07-25 DIAGNOSIS — Z888 Allergy status to other drugs, medicaments and biological substances status: Secondary | ICD-10-CM

## 2018-07-25 DIAGNOSIS — M4714 Other spondylosis with myelopathy, thoracic region: Secondary | ICD-10-CM | POA: Diagnosis present

## 2018-07-25 DIAGNOSIS — M4854XA Collapsed vertebra, not elsewhere classified, thoracic region, initial encounter for fracture: Secondary | ICD-10-CM | POA: Diagnosis present

## 2018-07-25 DIAGNOSIS — M21962 Unspecified acquired deformity of left lower leg: Secondary | ICD-10-CM | POA: Diagnosis present

## 2018-07-25 DIAGNOSIS — E119 Type 2 diabetes mellitus without complications: Secondary | ICD-10-CM | POA: Diagnosis present

## 2018-07-25 DIAGNOSIS — M4804 Spinal stenosis, thoracic region: Principal | ICD-10-CM | POA: Diagnosis present

## 2018-07-25 DIAGNOSIS — G9520 Unspecified cord compression: Secondary | ICD-10-CM

## 2018-07-25 DIAGNOSIS — Z833 Family history of diabetes mellitus: Secondary | ICD-10-CM

## 2018-07-25 DIAGNOSIS — Z79899 Other long term (current) drug therapy: Secondary | ICD-10-CM

## 2018-07-25 LAB — CBC WITH DIFFERENTIAL/PLATELET
Abs Immature Granulocytes: 0.03 10*3/uL (ref 0.00–0.07)
Basophils Absolute: 0 10*3/uL (ref 0.0–0.1)
Basophils Relative: 0 %
Eosinophils Absolute: 0.1 10*3/uL (ref 0.0–0.5)
Eosinophils Relative: 1 %
HCT: 40.8 % (ref 39.0–52.0)
Hemoglobin: 13 g/dL (ref 13.0–17.0)
Immature Granulocytes: 0 %
Lymphocytes Relative: 12 %
Lymphs Abs: 1.2 10*3/uL (ref 0.7–4.0)
MCH: 30.8 pg (ref 26.0–34.0)
MCHC: 31.9 g/dL (ref 30.0–36.0)
MCV: 96.7 fL (ref 80.0–100.0)
Monocytes Absolute: 0.8 10*3/uL (ref 0.1–1.0)
Monocytes Relative: 8 %
Neutro Abs: 7.8 10*3/uL — ABNORMAL HIGH (ref 1.7–7.7)
Neutrophils Relative %: 79 %
Platelets: 360 10*3/uL (ref 150–400)
RBC: 4.22 MIL/uL (ref 4.22–5.81)
RDW: 15.5 % (ref 11.5–15.5)
WBC: 10 10*3/uL (ref 4.0–10.5)
nRBC: 0 % (ref 0.0–0.2)

## 2018-07-25 LAB — BASIC METABOLIC PANEL
Anion gap: 10 (ref 5–15)
BUN: 17 mg/dL (ref 8–23)
CO2: 24 mmol/L (ref 22–32)
Calcium: 10 mg/dL (ref 8.9–10.3)
Chloride: 106 mmol/L (ref 98–111)
Creatinine, Ser: 1.21 mg/dL (ref 0.61–1.24)
GFR calc Af Amer: >60 mL/min (ref >60)
GFR calc non Af Amer: >60 mL/min (ref >60)
Glucose, Bld: 108 mg/dL — ABNORMAL HIGH (ref 70–99)
Potassium: 3.5 mmol/L (ref 3.5–5.1)
Sodium: 140 mmol/L (ref 135–145)

## 2018-07-25 MED ORDER — LORAZEPAM 1 MG PO TABS
1.0000 mg | ORAL_TABLET | Freq: Once | ORAL | Status: AC
  Administered 2018-07-25: 1 mg via ORAL
  Filled 2018-07-25: qty 1

## 2018-07-25 NOTE — ED Triage Notes (Addendum)
Patient here from home with complaints of fall this morning. Reports that he has sciatic nerve pain. Hx of same. Reports surgery in 2014. Spinal injection last week. Reports that symptoms are getting worse and has gotten worse since injection. Unable to use left leg, pain radiating down right leg into hip. Also reports urinary incontinence that started last week after the injection.

## 2018-07-25 NOTE — ED Notes (Signed)
Patient transported to MRI 

## 2018-07-25 NOTE — ED Notes (Signed)
Pt unhappy about waiting for the mri scanner

## 2018-07-25 NOTE — ED Notes (Signed)
Patient ambulated to room from triage with walker.

## 2018-07-25 NOTE — ED Notes (Signed)
To mri 

## 2018-07-25 NOTE — ED Notes (Signed)
Patient successfully ambulates with walker to and from restroom.

## 2018-07-25 NOTE — ED Notes (Signed)
Meal given

## 2018-07-25 NOTE — ED Notes (Signed)
Pt upset that he is still here and has not been told the results of his mri

## 2018-07-25 NOTE — ED Provider Notes (Signed)
  Physical Exam  BP 102/72   Pulse 63   Temp 98.1 F (36.7 C) (Oral)   Resp 18   SpO2 98%   Physical Exam  ED Course/Procedures     Procedures  MDM  Patient with back pain.  Abnormal MRI it was a long.  Transferred to see neurosurgery and get thoracic MRI.  Discussed with neurosurgery, will call him back after MRI.       Davonna Belling, MD 07/25/18 2100

## 2018-07-25 NOTE — ED Notes (Signed)
Unsuccessful attempt to give report  rn to call me back

## 2018-07-25 NOTE — H&P (Signed)
Neurosurgery H&P  CC: Left leg weakness  HPI: This is a 67 y.o. man that presents with 2 weeks of worsening left leg weakness. He started having difficulty ambulating roughly 2 weeks ago and had to start using a cane, then this week he was unable to ambulate with a cane. No change in bowel or bladder sensation, but he has had some symptoms consistent with overflow incontinence. No leg pain, no parasthesias, but he does have numbness across his entire left leg.   ROS: A 14 point ROS was performed and is negative except as noted in the HPI.   PMHx:  Past Medical History:  Diagnosis Date  . Allergy   . Chronic back pain   . Diabetes mellitus without complication ([place])   . Duodenal ulcer hemorrhage 08/29/2014  . ED (erectile dysfunction)   . Esophageal stricture 08/30/2014  . Glucose intolerance (impaired glucose tolerance)   . Hiatal hernia 08/30/2014  . Hypertension   . Hypokalemia 04/11/2013  . 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 Obesity   . Osteoarthritis   . Scoliosis 2016  . Spinal stenosis of lumbar region   . Urinary tract infection 04/11/2013   FamHx:  Family History  Problem Relation Age of Onset  . Diabetes Mother   . Asthma Mother   . Hypertension Father   . Stroke Father   . Colon cancer Neg Hx    SocHx:  reports that he has quit smoking. He has a 25.00 pack-year smoking history. He has never used smokeless tobacco. He reports that he drank alcohol. He reports that he does not use drugs.  Exam: Vital signs in last 24 hours: Temp:  [98.1 F (36.7 C)] 98.1 F (36.7 C) (10/18 1223) Pulse Rate:  [63-77] 66 (10/18 2030) Resp:  [18] 18 (10/18 1710) BP: (101-142)/(59-74) 112/67 (10/18 2030) SpO2:  [94 %-100 %] 99 % (10/18 2030) General: Awake, alert, cooperative, lying in bed in NAD Head: normocephalic and atruamatic HEENT: neck supple Pulmonary: breathing room air comfortably, no evidence of increased work of breathing Cardiac: RRR Abdomen: obese S NT ND Extremities:  warm and well perfused x4 Neuro: AOx3, PERRL, gaze neutral, FS Strength 5/5 x4 except 3/5 in LLE at HF / KE / KF, 4/5 in PF/DF but also limited by left ankle deformity from prior injury SILT except for diffuse numbness in all dermatomes of the left leg from just above the inguinal ligament down to all of his toes Reflexes 3+ at the knees bilaterally and toes upgoing bilaterally, no hoffman's  Assessment and Plan: 67 y.o. man with 2 weeks of worsening left leg numbness and weakness. MRI T/L-spine personally reviewed, MRI T-spine shows severe canal stenosis at T11-12 with cord signal change as well as other areas of spondylosis with some scoliosis. MRI L-spine shows scoliosis, moderate L3-4 central canal / lateral recess stenosis, L4-5 L>R foraminal & lateral recess stenosis, and severe L L5-1 foraminal stenosis. -admit for preop optimization and closer monitoring of function, planned OR on 10/21 for decompression of T11-12 -preop labs  Judith Part, MD 07/25/18 11:11 PM [facility]

## 2018-07-25 NOTE — ED Provider Notes (Signed)
[facility] DEPT Provider Note   CSN: [id number] Arrival date & time: 07/25/18  1216     History   Chief Complaint Chief Complaint  Patient presents with  . Urinary Incontinence  . Back Pain  . Difficulty Walking  . Fall    HPI Keith [name] is a 67 y.o. male who presents with a fall, left leg weakness and paresthesias. PMH significant for chronic back pain with sciatica, spinal stenosis, DM, hx of PUD, HTN. He states that over the past 2 months he has noticed progressively worsening weakness and numbness of the left leg. He had back surgery in 2014 by a doctor in [place]. He goes to pain management and receives injections in his back every six months. He had his last injection about a week ago. This helped a lot with his back pain but not his weakness or numbness. It has gotten to the point where he had to switch to walking with a cane last week and now walks with a walker. This morning it felt like he "had no legs" and when he stood up to go to the bathroom he fell to the floor. He denies any significant pain or injury from the fall but is very concerned about his weakness. He reports that he has new urinary and bowel incontinence over the past several days as well. No fever, syncope, unexplained weight loss, saddle anesthesia.  HPI  Past Medical History:  Diagnosis Date  . Allergy   . Chronic back pain   . Diabetes mellitus without complication ([place])   . Duodenal ulcer hemorrhage 08/29/2014  . ED (erectile dysfunction)   . Esophageal stricture 08/30/2014  . Glucose intolerance (impaired glucose tolerance)   . Hiatal hernia 08/30/2014  . Hypertension   . Hypokalemia 04/11/2013  . 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 Obesity   . Osteoarthritis   . Scoliosis 2016  . Spinal stenosis of lumbar region   . Urinary tract infection 04/11/2013    Patient Active Problem List   Diagnosis Date Noted  . Hyperlipidemia associated with type 2 diabetes  mellitus ([place]) 11/14/2016  . Controlled type 2 diabetes mellitus without complication, without long-term current use of insulin ([place]) 11/14/2016  . Routine general medical examination at a health care facility 11/14/2015  . Hiatal hernia 08/30/2014  . Allergic rhinitis 04/10/2013  . Spinal stenosis of lumbar region 11/06/2012  . Allergic rhinitis due to pollen 01/15/2011  . OSTEOARTHRITIS 11/02/2008  . HIP PAIN, LEFT 02/09/2008  . Obesity 11/07/2007  . ERECTILE DYSFUNCTION, MILD 11/07/2007  . Essential hypertension 11/07/2007    Past Surgical History:  Procedure Laterality Date  . COLONOSCOPY  2008  . ESOPHAGOGASTRODUODENOSCOPY N/A 08/29/2014   Procedure: ESOPHAGOGASTRODUODENOSCOPY (EGD);  Surgeon: Lafayette Dragon, MD;  Location: [facility];  Service: Endoscopy;  Laterality: N/A;  . LUMBAR LAMINECTOMY    . NASAL POLYP SURGERY    . TONSILLECTOMY          Home Medications    Prior to Admission medications   Medication Sig Start Date End Date Taking? Authorizing Provider  atenolol-chlorthalidone (TENORETIC) 50-25 MG tablet Take 1 tablet by mouth daily. 11/11/17   Dorena Cookey, MD  cloNIDine (CATAPRES) 0.1 MG tablet TAKE 1 TABLET (0.1 MG TOTAL) BY MOUTH DAILY. 11/11/17   Dorena Cookey, MD  DULoxetine (CYMBALTA) 30 MG capsule Take 1 tablet once daily 01/01/17   Marlaine Hind, MD  metFORMIN (GLUCOPHAGE) 500 MG tablet TAKE 1/2 TABLET BY MOUTH IN  THE MORNING 02/10/18   Dorena Cookey, MD  Multiple Vitamin (MULITIVITAMIN WITH MINERALS) TABS Take 1 tablet by mouth daily.    [provider]  OXYCONTIN 10 MG 12 hr tablet Take 1 tablet every 8 hours PRN 01/25/17   Marlaine Hind, MD  sildenafil (REVATIO) 20 MG tablet 1-2 tablets 2-3 hours prior to sex 11/11/17   Dorena Cookey, MD  simvastatin (ZOCOR) 20 MG tablet Take 1 tablet (20 mg total) by mouth at bedtime. 11/11/17   Dorena Cookey, MD    Family History Family History  Problem Relation Age of Onset  . Diabetes Mother   .  Asthma Mother   . Hypertension Father   . Stroke Father   . Colon cancer Neg Hx     Social History Social History   Tobacco Use  . Smoking status: Former Smoker    Packs/day: 1.00    Years: 25.00    Pack years: 25.00  . Smokeless tobacco: Never Used  Substance Use Topics  . Alcohol use: Not Currently    Comment: Christmas/4th of July  . Drug use: No     Allergies   Lisinopril   Review of Systems Review of Systems  Constitutional: Negative for fever.  Gastrointestinal: Negative for abdominal pain.  Musculoskeletal: Positive for back pain and gait problem.  Neurological: Positive for weakness and numbness.  All other systems reviewed and are negative.    Physical Exam Updated Vital Signs BP (!) 142/74 (BP Location: Left Arm)   Pulse 77   Temp 98.1 F (36.7 C) (Oral)   Resp 18   SpO2 94%   Physical Exam  Constitutional: He is oriented to person, place, and time. He appears well-developed and well-nourished. No distress.  Calm, cooperative, obese  HENT:  Head: Normocephalic and atraumatic.  Eyes: Pupils are equal, round, and reactive to light. Conjunctivae are normal. Right eye exhibits no discharge. Left eye exhibits no discharge. No scleral icterus.  Neck: Normal range of motion.  Cardiovascular: Normal rate.  Pulmonary/Chest: Effort normal. No respiratory distress.  Abdominal: He exhibits no distension.  Musculoskeletal:  Back: Inspection: No masses, deformity, or rash. Prior surgical scar noted. Palpation: No midline spinal tenderness. No paraspinal muscle tenderness. Strength: 5/5 in right leg. 2/5 in left leg.  Sensation: Subjective decreased sensation circumferentially throughout the left leg. Normal sensation of the right leg Reflexes: Patellar reflex is 2+ bilaterally SLR: Negative seated straight leg raise Gait: Uses walker and drags left leg. Rotational deformity of of left foot.   Neurological: He is alert and oriented to person, place, and  time.  Skin: Skin is warm and dry.  Psychiatric: He has a normal mood and affect. His behavior is normal.  Nursing note and vitals reviewed.    ED Treatments / Results  Labs (all labs ordered are listed, but only abnormal results are displayed) Labs Reviewed  BASIC METABOLIC PANEL - Abnormal; Notable for the following components:      Result Value   Glucose, Bld 108 (*)    All other components within normal limits  CBC WITH DIFFERENTIAL/PLATELET - Abnormal; Notable for the following components:   Neutro Abs 7.8 (*)    All other components within normal limits    EKG None  Radiology Mr Lumbar Spine Wo Contrast  Addendum Date: 07/25/2018   ADDENDUM REPORT: 07/25/2018 16:28 ADDENDUM: Study discussed by telephone with Dr. Janetta Hora on 07/25/2018 at 1615 hours. Electronically Signed   By: Herminio Heads.D.  On: 07/25/2018 16:28   Result Date: 07/25/2018 CLINICAL DATA:  67 year old male status post fall this morning. Progressive chronic back pain. Spinal injection last week. Pain radiating down the right hip and leg. Urinary incontinence. EXAM: MRI LUMBAR SPINE WITHOUT CONTRAST TECHNIQUE: Multiplanar, multisequence MR imaging of the lumbar spine was performed. No intravenous contrast was administered. COMPARISON:  Lumbar radiographs 02/13/2016.  Lumbar MRI 07/26/2013. FINDINGS: Segmentation: Normal on the comparison radiographs. This is the same numbering system used on the 2014 MRI. Alignment: Severe levoconvex thoracolumbar scoliosis, apex at L2. This spinal curvature appears not significantly changed since the 2014 MRI. There is superimposed straightening of lumbar lordosis, chronic mild retrolisthesis of L5 on S1, and mild chronic grade 1 anterolisthesis of L4 on L5. Vertebrae: Intact visible sacrum and SI joints. Degenerative endplate and posterior element marrow signal changes but no No marrow edema or evidence of acute osseous abnormality. An L4 superior endplate Schmorl's node is  chronic but new since 2014. Conus medullaris and cauda equina: Conus extends to the L1 level. The conus appears normal, but there is new or increased degenerative mass effect on the lower thoracic spinal cord at T11-T12 with possible new spinal cord myelomalacia. Compare series 4, image 7 and series 5, image 7 today to series 4, image 4 and series 6, image 4 in 2014. Paraspinal and other soft tissues: Negative visible abdominal viscera. Negative visualized posterior paraspinal soft tissues. Disc levels: T9-T10: Evidence of interbody ankylosis on series 4, image 5. Mild to moderate left facet hypertrophy. No significant stenosis. T10-T11: Possible left far lateral interbody ankylosis. Moderate to severe left facet hypertrophy. No spinal stenosis suspected but severe left T10 foraminal stenosis. T11-T12: Chronic disc degeneration with disc bulging and moderate to severe posterior element hypertrophy greater on the left. Severe left facet hypertrophy appears progressed since 2014 now with degenerative facet joint fluid (series 5, image 10). Mild spinal stenosis appears progressed now with spinal cord mass effect and possible cord signal abnormality as stated above. Severe left and moderate right T11 foraminal stenosis is stable to progressed. T12-L1: No spinal stenosis despite right eccentric disc and posterior element degeneration. Stable mild T12 foraminal stenosis. L1-L2: New mild spinal and increased mild to moderate right lateral recess stenosis since 2014 (right L2 nerve level) related to progressed rightward disc and posterior element degeneration. Moderate to severe right L1 foraminal stenosis is also increased. L2-L3: Mildly progressed chronic spinal and right lateral recess stenosis (right L3 nerve level), now mild to moderate. Right eccentric disc and right greater than left posterior element degeneration. Moderate right L2 foraminal stenosis is increased. L3-L4: Progressed moderate to severe spinal and  bilateral lateral recess stenosis since 2014 related to disc and severe posterior element hypertrophy. Moderate to severe bilateral L3 foraminal stenosis also appears increased. L4-L5: Progressed moderate to severe spinal stenosis related to grade 1 anterolisthesis with bulky disc and severe posterior element degeneration. Chronic degenerative facet joint fluid. Preexisting severe left and moderate to severe right lateral recess stenosis appear less progressed. Likewise, severe left and moderate to severe right L4 foraminal stenosis appear more stable. L5-S1: No spinal or lateral recess stenosis despite chronic disc and left greater than right posterior element degeneration. Severe left L5 foraminal stenosis has increased. IMPRESSION: 1. Progressed chronic lower thoracic spinal stenosis at T11-T12 since the 2014 MRI. Spinal cord mass effect appears new or increased and there is possibly spinal cord myelomalacia now at this level. 2. There may be interbody ankylosis at both the T9-T10 and T10-T11  levels. 3. Chronic severe levoconvex lumbar scoliosis with generalized progression of lumbar spine degeneration since the 2014 MRI: - severe L4-L5 spinal stenosis and left greater than right lateral recess and foraminal stenosis. - moderate to severe L3-L4 spinal and bilateral lateral recess and foraminal stenosis. - mild-to-moderate L2-L3 spinal, right lateral recess and right foraminal stenosis. - mild L1-L2 spinal, mild to moderate right lateral recess, and moderate to severe right neural foraminal stenosis. - severe left L5-S1 foraminal stenosis. Electronically Signed: By: Genevie Ann M.D. On: 07/25/2018 16:09    Procedures Procedures (including critical care time)  Medications Ordered in ED Medications  LORazepam (ATIVAN) tablet 1 mg (1 mg Oral Given 07/25/18 1520)     Initial Impression / Assessment and Plan / ED Course  I have reviewed the triage vital signs and the nursing notes.  Pertinent labs & imaging  results that were available during my care of the patient were reviewed by me and considered in my medical decision making (see chart for details).  67 year old male presents with worsening leg weakness and gait abnormality over the past several weeks. He was previously independently ambulatory and now can only walk with assistance with a walker. His vitals are normal. On exam, he has decreased sensation of his entire left leg and was able to stand with the walker but was dragging his left leg. His reflexes are intact. Will order MRI of L spine.  Discussed with radiologist on the phone. They are concerned about cord compression and abnormal cord signal at the level of T11-T12. Will discuss with neurosurgery.  4:48 PM Discussed with Dr. Franky Macho with NS who recommends sending to [facility] and MRI of T-spine. MRI is backed up here at [facility] so will order and he can have this done at [facility]. Spoke with Dr. Alvino Chapel who is aware of transfer and will accept.  Final Clinical Impressions(s) / ED Diagnoses   Final diagnoses:  Left leg weakness  Spinal cord compression [facility])    ED Discharge Orders    None       Recardo Evangelist, PA-C 07/25/18 1732    Charlesetta Shanks, MD 08/01/18 [phone]

## 2018-07-26 ENCOUNTER — Other Ambulatory Visit: Payer: Self-pay

## 2018-07-26 LAB — CBC
HCT: 38.7 % — ABNORMAL LOW (ref 39.0–52.0)
Hemoglobin: 12.4 g/dL — ABNORMAL LOW (ref 13.0–17.0)
MCH: 30.2 pg (ref 26.0–34.0)
MCHC: 32 g/dL (ref 30.0–36.0)
MCV: 94.4 fL (ref 80.0–100.0)
Platelets: 338 10*3/uL (ref 150–400)
RBC: 4.1 MIL/uL — ABNORMAL LOW (ref 4.22–5.81)
RDW: 15.4 % (ref 11.5–15.5)
WBC: 7.5 10*3/uL (ref 4.0–10.5)
nRBC: 0 % (ref 0.0–0.2)

## 2018-07-26 LAB — COMPREHENSIVE METABOLIC PANEL
ALT: 26 U/L (ref 0–44)
AST: 29 U/L (ref 15–41)
Albumin: 3.9 g/dL (ref 3.5–5.0)
Alkaline Phosphatase: 79 U/L (ref 38–126)
Anion gap: 9 (ref 5–15)
BUN: 13 mg/dL (ref 8–23)
CO2: 28 mmol/L (ref 22–32)
Calcium: 9.9 mg/dL (ref 8.9–10.3)
Chloride: 101 mmol/L (ref 98–111)
Creatinine, Ser: 1.04 mg/dL (ref 0.61–1.24)
GFR calc Af Amer: >60 mL/min (ref >60)
GFR calc non Af Amer: >60 mL/min (ref >60)
Glucose, Bld: 116 mg/dL — ABNORMAL HIGH (ref 70–99)
Potassium: 3 mmol/L — ABNORMAL LOW (ref 3.5–5.1)
Sodium: 138 mmol/L (ref 135–145)
Total Bilirubin: 0.7 mg/dL (ref 0.3–1.2)
Total Protein: 8.2 g/dL — ABNORMAL HIGH (ref 6.5–8.1)

## 2018-07-26 LAB — PROTIME-INR
INR: 1.11
Prothrombin Time: 14.2 seconds (ref 11.4–15.2)

## 2018-07-26 LAB — HIV ANTIBODY (ROUTINE TESTING W REFLEX): HIV Screen 4th Generation wRfx: NONREACTIVE

## 2018-07-26 LAB — APTT: aPTT: 34 seconds (ref 24–36)

## 2018-07-26 LAB — MRSA PCR SCREENING: MRSA by PCR: NEGATIVE

## 2018-07-26 MED ORDER — HEPARIN SODIUM (PORCINE) 5000 UNIT/ML IJ SOLN
5000.0000 [IU] | Freq: Three times a day (TID) | INTRAMUSCULAR | Status: DC
  Administered 2018-07-26 – 2018-07-28 (×7): 5000 [IU] via SUBCUTANEOUS
  Filled 2018-07-26 (×7): qty 1

## 2018-07-26 MED ORDER — OXYCODONE HCL 5 MG PO TABS
5.0000 mg | ORAL_TABLET | ORAL | Status: DC | PRN
  Administered 2018-07-28: 5 mg via ORAL
  Filled 2018-07-26 (×2): qty 1

## 2018-07-26 MED ORDER — CHLORTHALIDONE 25 MG PO TABS
25.0000 mg | ORAL_TABLET | Freq: Every day | ORAL | Status: DC
  Administered 2018-07-26 – 2018-07-31 (×6): 25 mg via ORAL
  Filled 2018-07-26 (×7): qty 1

## 2018-07-26 MED ORDER — CLONIDINE HCL 0.1 MG PO TABS
0.1000 mg | ORAL_TABLET | Freq: Every day | ORAL | Status: DC
  Administered 2018-07-26 – 2018-07-31 (×6): 0.1 mg via ORAL
  Filled 2018-07-26 (×8): qty 1

## 2018-07-26 MED ORDER — SIMVASTATIN 20 MG PO TABS
20.0000 mg | ORAL_TABLET | Freq: Every day | ORAL | Status: DC
  Administered 2018-07-26 – 2018-07-30 (×6): 20 mg via ORAL
  Filled 2018-07-26 (×6): qty 1

## 2018-07-26 MED ORDER — HYDROMORPHONE HCL 1 MG/ML IJ SOLN
0.5000 mg | INTRAMUSCULAR | Status: DC | PRN
  Administered 2018-07-28 – 2018-07-30 (×8): 0.5 mg via INTRAVENOUS
  Filled 2018-07-26 (×8): qty 0.5

## 2018-07-26 MED ORDER — ONDANSETRON HCL 4 MG/2ML IJ SOLN: 4.0000 mg | Freq: Four times a day (QID) | INTRAMUSCULAR | Status: DC | PRN

## 2018-07-26 MED ORDER — ATENOLOL 25 MG PO TABS
50.0000 mg | ORAL_TABLET | Freq: Every day | ORAL | Status: DC
  Administered 2018-07-26 – 2018-07-31 (×6): 50 mg via ORAL
  Filled 2018-07-26 (×8): qty 2

## 2018-07-26 MED ORDER — DULOXETINE HCL 30 MG PO CPEP
30.0000 mg | ORAL_CAPSULE | Freq: Every day | ORAL | Status: DC
  Administered 2018-07-26 – 2018-07-31 (×6): 30 mg via ORAL
  Filled 2018-07-26 (×7): qty 1

## 2018-07-26 MED ORDER — ONDANSETRON HCL 4 MG PO TABS: 4.0000 mg | ORAL_TABLET | Freq: Four times a day (QID) | ORAL | Status: DC | PRN

## 2018-07-26 MED ORDER — OXYCODONE HCL 5 MG PO TABS
10.0000 mg | ORAL_TABLET | ORAL | Status: DC | PRN
  Administered 2018-07-26 – 2018-07-29 (×9): 10 mg via ORAL
  Filled 2018-07-26 (×9): qty 2

## 2018-07-26 MED ORDER — ATENOLOL-CHLORTHALIDONE 50-25 MG PO TABS: 1.0000 | ORAL_TABLET | Freq: Every day | ORAL | Status: DC

## 2018-07-26 MED ORDER — METFORMIN HCL 500 MG PO TABS
250.0000 mg | ORAL_TABLET | Freq: Every day | ORAL | Status: DC
  Administered 2018-07-26 – 2018-07-31 (×5): 250 mg via ORAL
  Filled 2018-07-26 (×5): qty 1

## 2018-07-26 MED ORDER — DOCUSATE SODIUM 100 MG PO CAPS
100.0000 mg | ORAL_CAPSULE | Freq: Two times a day (BID) | ORAL | Status: DC
  Administered 2018-07-26 – 2018-07-31 (×10): 100 mg via ORAL
  Filled 2018-07-26 (×11): qty 1

## 2018-07-26 MED ORDER — POLYETHYLENE GLYCOL 3350 17 G PO PACK: 17.0000 g | PACK | Freq: Every day | ORAL | Status: DC | PRN

## 2018-07-26 NOTE — ED Notes (Signed)
Report called to rn on 3w 

## 2018-07-27 ENCOUNTER — Inpatient Hospital Stay (HOSPITAL_COMMUNITY): Payer: BLUE CROSS/BLUE SHIELD

## 2018-07-27 LAB — TYPE AND SCREEN
ABO/RH(D): O POS
Antibody Screen: NEGATIVE

## 2018-07-27 LAB — ABO/RH: ABO/RH(D): O POS

## 2018-07-27 MED ORDER — GADOBUTROL 1 MMOL/ML IV SOLN
10.0000 mL | Freq: Once | INTRAVENOUS | Status: AC | PRN
  Administered 2018-07-27: 10 mL via INTRAVENOUS

## 2018-07-27 NOTE — Progress Notes (Signed)
Neurosurgery Service Progress Note  Subjective: No acute events overnight, no new complaints   Objective: Vitals:   07/26/18 1952 07/27/18 0007 07/27/18 0351 07/27/18 1202  BP: 111/71 117/80 129/76 110/78  Pulse: 70 75 78 72  Resp:   18 18  Temp: 98.4 F (36.9 C) 98.6 F (37 C) 98.3 F (36.8 C) 98.4 F (36.9 C)  TempSrc: Oral Oral Oral   SpO2: 99% 99% 100% 100%   Temp (24hrs), Avg:98.5 F (36.9 C), Min:98.3 F (36.8 C), Max:98.7 F (37.1 C)  CBC Latest Ref Rng & Units 07/26/2018 07/25/2018 11/11/2017  WBC 4.0 - 10.5 K/uL 7.5 10.0 5.3  Hemoglobin 13.0 - 17.0 g/dL 12.4(L) 13.0 13.6  Hematocrit 39.0 - 52.0 % 38.7(L) 40.8 40.4  Platelets 150 - 400 K/uL 338 360 265.0   BMP Latest Ref Rng & Units 07/26/2018 07/25/2018 11/11/2017  Glucose 70 - 99 mg/dL 116(H) 108(H) 137(H)  BUN 8 - 23 mg/dL 13 17 19   Creatinine 0.61 - 1.24 mg/dL 1.04 1.21 0.99  Sodium 135 - 145 mmol/L 138 140 140  Potassium 3.5 - 5.1 mmol/L 3.0(L) 3.5 3.6  Chloride 98 - 111 mmol/L 101 106 104  CO2 22 - 32 mmol/L 28 24 26   Calcium 8.9 - 10.3 mg/dL 9.9 10.0 9.7    Intake/Output Summary (Last 24 hours) at 07/27/2018 1257 Last data filed at 07/27/2018 0600 Gross per 24 hour  Intake 360 ml  Output 560 ml  Net -200 ml    Current Facility-Administered Medications:  .  atenolol (TENORMIN) tablet 50 mg, 50 mg, Oral, Daily, 50 mg at 07/27/18 0838 **AND** chlorthalidone (HYGROTON) tablet 25 mg, 25 mg, Oral, Daily, Judith Part, MD, 25 mg at 07/27/18 0837 .  cloNIDine (CATAPRES) tablet 0.1 mg, 0.1 mg, Oral, Daily, Judith Part, MD, 0.1 mg at 07/27/18 0837 .  docusate sodium (COLACE) capsule 100 mg, 100 mg, Oral, BID, Judith Part, MD, 100 mg at 07/27/18 6237 .  DULoxetine (CYMBALTA) DR capsule 30 mg, 30 mg, Oral, Daily, Aarthi Uyeno A, MD, 30 mg at 07/27/18 0836 .  heparin injection 5,000 Units, 5,000 Units, Subcutaneous, Q8H, Judith Part, MD, 5,000 Units at 07/27/18 [phone] .   HYDROmorphone (DILAUDID) injection 0.5 mg, 0.5 mg, Intravenous, Q3H PRN, Judith Part, MD .  metFORMIN (GLUCOPHAGE) tablet 250 mg, 250 mg, Oral, Q breakfast, Elenor Wildes, Joyice Faster, MD, 250 mg at 07/27/18 0836 .  ondansetron (ZOFRAN) tablet 4 mg, 4 mg, Oral, Q6H PRN **OR** ondansetron (ZOFRAN) injection 4 mg, 4 mg, Intravenous, Q6H PRN, Letha Mirabal A, MD .  oxyCODONE (Oxy IR/ROXICODONE) immediate release tablet 10 mg, 10 mg, Oral, Q4H PRN, Judith Part, MD, 10 mg at 07/26/18 2135 .  oxyCODONE (Oxy IR/ROXICODONE) immediate release tablet 5 mg, 5 mg, Oral, Q4H PRN, Otilio Groleau A, MD .  polyethylene glycol (MIRALAX / GLYCOLAX) packet 17 g, 17 g, Oral, Daily PRN, Judith Part, MD .  simvastatin (ZOCOR) tablet 20 mg, 20 mg, Oral, QHS, Ajax Schroll, Joyice Faster, MD, 20 mg at 07/26/18 2135   Physical Exam: Strength 5/5 in BUE and RLE LLE HF 3/5 HF / KE / KF, 4/5 in PF/DF but also limited by left ankle deformity SILT except for diffuse numbness in all dermatomes of the left leg from just above the inguinal ligament down to all of his toes Reflexes 3+ at the knees bilaterally and toes upgoing bilaterally, no hoffman's  Assessment & Plan: 67 y.o. man with progressive LLE weakness, MRI with  T11-12 stenosis and cord signal change. -given the cord edema, will obtain MRI T-spine w/ contrast to make sure there is not an underlying mass -OR in AM for posterior and transpedicular decompression, instrumented fusion -NPO p MN, preop labs -discussed r/b/a of surgery with special emphasis on the possibility of cord injury during thoracic disc decompression as well as decreased likelihood of recovery of current deficits, especially sacral symptoms - urinary retention and sexual dysfunction, pt understands and wishes to proceed  Judith Part  07/27/18 12:57 PM

## 2018-07-28 ENCOUNTER — Inpatient Hospital Stay (HOSPITAL_COMMUNITY): Payer: BLUE CROSS/BLUE SHIELD | Admitting: Anesthesiology

## 2018-07-28 ENCOUNTER — Encounter (HOSPITAL_COMMUNITY)
Admission: EM | Disposition: A | Discharge status: Home / Self Care | Payer: Self-pay | Source: Home / Self Care | Attending: Neurological Surgery

## 2018-07-28 ENCOUNTER — Inpatient Hospital Stay (HOSPITAL_COMMUNITY): Payer: BLUE CROSS/BLUE SHIELD

## 2018-07-28 ENCOUNTER — Encounter (HOSPITAL_COMMUNITY): Payer: Self-pay | Admitting: Certified Registered"

## 2018-07-28 HISTORY — PX: LAMINECTOMY: SHX219

## 2018-07-28 LAB — SURGICAL PCR SCREEN
MRSA, PCR: NEGATIVE
Staphylococcus aureus: NEGATIVE

## 2018-07-28 LAB — POCT I-STAT 4, (NA,K, GLUC, HGB,HCT)
Glucose, Bld: 134 mg/dL — ABNORMAL HIGH (ref 70–99)
HCT: 37 % — ABNORMAL LOW (ref 39.0–52.0)
Hemoglobin: 12.6 g/dL — ABNORMAL LOW (ref 13.0–17.0)
Potassium: 3.1 mmol/L — ABNORMAL LOW (ref 3.5–5.1)
Sodium: 140 mmol/L (ref 135–145)

## 2018-07-28 LAB — GLUCOSE, CAPILLARY: Glucose-Capillary: 148 mg/dL — ABNORMAL HIGH (ref 70–99)

## 2018-07-28 SURGERY — THORACIC LAMINECTOMY FOR TUMOR
Anesthesia: General | Site: Back

## 2018-07-28 MED ORDER — ACETAMINOPHEN 160 MG/5ML PO SOLN: 1000.0000 mg | Freq: Once | ORAL | Status: DC | PRN

## 2018-07-28 MED ORDER — ONDANSETRON HCL 4 MG/2ML IJ SOLN
INTRAMUSCULAR | Status: DC | PRN
  Administered 2018-07-28: 4 mg via INTRAVENOUS

## 2018-07-28 MED ORDER — BACITRACIN ZINC 500 UNIT/GM EX OINT
TOPICAL_OINTMENT | CUTANEOUS | Status: AC
  Filled 2018-07-28: qty 28.35

## 2018-07-28 MED ORDER — OXYCODONE HCL 5 MG/5ML PO SOLN: 5.0000 mg | Freq: Once | ORAL | Status: DC | PRN

## 2018-07-28 MED ORDER — SUGAMMADEX SODIUM 500 MG/5ML IV SOLN
INTRAVENOUS | Status: AC
  Filled 2018-07-28: qty 5

## 2018-07-28 MED ORDER — LACTATED RINGERS IV SOLN
INTRAVENOUS | Status: DC
  Administered 2018-07-28 (×2): via INTRAVENOUS

## 2018-07-28 MED ORDER — BACITRACIN ZINC 500 UNIT/GM EX OINT
TOPICAL_OINTMENT | CUTANEOUS | Status: DC | PRN
  Administered 2018-07-28: 1 via TOPICAL

## 2018-07-28 MED ORDER — LIDOCAINE 2% (20 MG/ML) 5 ML SYRINGE
INTRAMUSCULAR | Status: DC | PRN
  Administered 2018-07-28: 60 mg via INTRAVENOUS

## 2018-07-28 MED ORDER — MIDAZOLAM HCL 5 MG/5ML IJ SOLN
INTRAMUSCULAR | Status: DC | PRN
  Administered 2018-07-28: 2 mg via INTRAVENOUS

## 2018-07-28 MED ORDER — THROMBIN 5000 UNITS EX SOLR
CUTANEOUS | Status: AC
  Filled 2018-07-28: qty 5000

## 2018-07-28 MED ORDER — 0.9 % SODIUM CHLORIDE (POUR BTL) OPTIME
TOPICAL | Status: DC | PRN
  Administered 2018-07-28: 1000 mL

## 2018-07-28 MED ORDER — ACETAMINOPHEN 10 MG/ML IV SOLN: 1000.0000 mg | Freq: Once | INTRAVENOUS | Status: DC | PRN

## 2018-07-28 MED ORDER — OXYCODONE HCL 5 MG PO TABS
ORAL_TABLET | ORAL | Status: AC
  Administered 2018-07-28: 10 mg via ORAL
  Filled 2018-07-28: qty 2

## 2018-07-28 MED ORDER — MUPIROCIN 2 % EX OINT
1.0000 "application " | TOPICAL_OINTMENT | Freq: Two times a day (BID) | CUTANEOUS | Status: AC
  Administered 2018-07-28 – 2018-07-30 (×3): 1 via NASAL
  Filled 2018-07-28: qty 22

## 2018-07-28 MED ORDER — SODIUM CHLORIDE 0.9 % IV SOLN
INTRAVENOUS | Status: DC | PRN
  Administered 2018-07-28: 250 mL via INTRAVENOUS

## 2018-07-28 MED ORDER — LIDOCAINE-EPINEPHRINE 1 %-1:100000 IJ SOLN
INTRAMUSCULAR | Status: DC | PRN
  Administered 2018-07-28: 10 mL

## 2018-07-28 MED ORDER — LIDOCAINE 2% (20 MG/ML) 5 ML SYRINGE
INTRAMUSCULAR | Status: AC
  Filled 2018-07-28: qty 5

## 2018-07-28 MED ORDER — SODIUM CHLORIDE 0.9 % IV SOLN
INTRAVENOUS | Status: DC | PRN
  Administered 2018-07-28: 16:00:00

## 2018-07-28 MED ORDER — ACETAMINOPHEN 500 MG PO TABS: 1000.0000 mg | ORAL_TABLET | Freq: Once | ORAL | Status: DC | PRN

## 2018-07-28 MED ORDER — SUCCINYLCHOLINE CHLORIDE 200 MG/10ML IV SOSY
PREFILLED_SYRINGE | INTRAVENOUS | Status: AC
  Filled 2018-07-28: qty 10

## 2018-07-28 MED ORDER — ROCURONIUM BROMIDE 50 MG/5ML IV SOSY
PREFILLED_SYRINGE | INTRAVENOUS | Status: AC
  Filled 2018-07-28: qty 5

## 2018-07-28 MED ORDER — METHOCARBAMOL 500 MG PO TABS
500.0000 mg | ORAL_TABLET | Freq: Three times a day (TID) | ORAL | Status: DC | PRN
  Administered 2018-07-29 – 2018-07-30 (×3): 500 mg via ORAL
  Filled 2018-07-28 (×3): qty 1

## 2018-07-28 MED ORDER — FENTANYL CITRATE (PF) 250 MCG/5ML IJ SOLN
INTRAMUSCULAR | Status: DC | PRN
  Administered 2018-07-28 (×4): 50 ug via INTRAVENOUS

## 2018-07-28 MED ORDER — FENTANYL CITRATE (PF) 250 MCG/5ML IJ SOLN
INTRAMUSCULAR | Status: AC
  Filled 2018-07-28: qty 5

## 2018-07-28 MED ORDER — METHOCARBAMOL 1000 MG/10ML IJ SOLN
500.0000 mg | Freq: Once | INTRAVENOUS | Status: AC
  Administered 2018-07-29: 500 mg via INTRAVENOUS
  Filled 2018-07-28: qty 500

## 2018-07-28 MED ORDER — MIDAZOLAM HCL 2 MG/2ML IJ SOLN
INTRAMUSCULAR | Status: AC
  Filled 2018-07-28: qty 2

## 2018-07-28 MED ORDER — FENTANYL CITRATE (PF) 100 MCG/2ML IJ SOLN: 25.0000 ug | INTRAMUSCULAR | Status: DC | PRN

## 2018-07-28 MED ORDER — PROPOFOL 10 MG/ML IV BOLUS
INTRAVENOUS | Status: DC | PRN
  Administered 2018-07-28: 140 mg via INTRAVENOUS

## 2018-07-28 MED ORDER — LIDOCAINE-EPINEPHRINE 1 %-1:100000 IJ SOLN
INTRAMUSCULAR | Status: AC
  Filled 2018-07-28: qty 1

## 2018-07-28 MED ORDER — THROMBIN 5000 UNITS EX SOLR
OROMUCOSAL | Status: DC | PRN
  Administered 2018-07-28: 16:00:00 via TOPICAL

## 2018-07-28 MED ORDER — CEFAZOLIN SODIUM-DEXTROSE 2-3 GM-%(50ML) IV SOLR
INTRAVENOUS | Status: DC | PRN
  Administered 2018-07-28: 3 g via INTRAVENOUS

## 2018-07-28 MED ORDER — ONDANSETRON HCL 4 MG/2ML IJ SOLN
INTRAMUSCULAR | Status: AC
  Filled 2018-07-28: qty 2

## 2018-07-28 MED ORDER — CEFAZOLIN SODIUM 1 G IJ SOLR
INTRAMUSCULAR | Status: AC
  Filled 2018-07-28: qty 30

## 2018-07-28 MED ORDER — HYDROMORPHONE HCL 1 MG/ML IJ SOLN
1.0000 mg | Freq: Once | INTRAMUSCULAR | Status: AC
  Administered 2018-07-28: 1 mg via INTRAVENOUS
  Filled 2018-07-28: qty 1

## 2018-07-28 MED ORDER — PROPOFOL 10 MG/ML IV BOLUS
INTRAVENOUS | Status: AC
  Filled 2018-07-28: qty 20

## 2018-07-28 MED ORDER — ROCURONIUM BROMIDE 10 MG/ML (PF) SYRINGE
PREFILLED_SYRINGE | INTRAVENOUS | Status: DC | PRN
  Administered 2018-07-28 (×2): 20 mg via INTRAVENOUS
  Administered 2018-07-28: 50 mg via INTRAVENOUS

## 2018-07-28 MED ORDER — OXYCODONE HCL 5 MG PO TABS: 5.0000 mg | ORAL_TABLET | Freq: Once | ORAL | Status: DC | PRN

## 2018-07-28 SURGICAL SUPPLY — 56 items
BAG DECANTER FOR FLEXI CONT (MISCELLANEOUS) ×4 IMPLANT
BLADE CLIPPER SURG (BLADE) ×3 IMPLANT
BLADE SURG 11 STRL SS (BLADE) ×4 IMPLANT
BUR MATCHSTICK NEURO 3.0 LAGG (BURR) ×3 IMPLANT
BUR PRECISION FLUTE 5.0 (BURR) ×3 IMPLANT
CANISTER SUCT 3000ML PPV (MISCELLANEOUS) ×4 IMPLANT
CONT SPEC 4OZ CLIKSEAL STRL BL (MISCELLANEOUS) ×3 IMPLANT
COVER WAND RF STERILE (DRAPES) ×4 IMPLANT
DECANTER SPIKE VIAL GLASS SM (MISCELLANEOUS) ×4 IMPLANT
DERMABOND ADVANCED (GAUZE/BANDAGES/DRESSINGS) ×2
DERMABOND ADVANCED .7 DNX12 (GAUZE/BANDAGES/DRESSINGS) ×2 IMPLANT
DRAPE C-ARM 42X72 X-RAY (DRAPES) ×5 IMPLANT
DRAPE LAPAROTOMY 100X72X124 (DRAPES) ×4 IMPLANT
DRAPE MICROSCOPE LEICA (MISCELLANEOUS) ×4 IMPLANT
DRAPE SURG 17X23 STRL (DRAPES) ×4 IMPLANT
DRSG OPSITE POSTOP 4X10 (GAUZE/BANDAGES/DRESSINGS) ×3 IMPLANT
DURAPREP 26ML APPLICATOR (WOUND CARE) ×4 IMPLANT
ELECT REM PT RETURN 9FT ADLT (ELECTROSURGICAL) ×4
ELECTRODE REM PT RTRN 9FT ADLT (ELECTROSURGICAL) ×2 IMPLANT
FLOSEAL 5ML (HEMOSTASIS) ×4 IMPLANT
GAUZE 4X4 16PLY RFD (DISPOSABLE) IMPLANT
GAUZE SPONGE 4X4 12PLY STRL (GAUZE/BANDAGES/DRESSINGS) IMPLANT
GLOVE BIO SURGEON STRL SZ7.5 (GLOVE) ×4 IMPLANT
GLOVE BIOGEL PI IND STRL 7.5 (GLOVE) ×2 IMPLANT
GLOVE BIOGEL PI INDICATOR 7.5 (GLOVE) ×2
GLOVE EXAM NITRILE LRG STRL (GLOVE) IMPLANT
GLOVE EXAM NITRILE XL STR (GLOVE) IMPLANT
GLOVE EXAM NITRILE XS STR PU (GLOVE) IMPLANT
GOWN STRL REUS W/ TWL LRG LVL3 (GOWN DISPOSABLE) ×4 IMPLANT
GOWN STRL REUS W/ TWL XL LVL3 (GOWN DISPOSABLE) IMPLANT
GOWN STRL REUS W/TWL 2XL LVL3 (GOWN DISPOSABLE) IMPLANT
GOWN STRL REUS W/TWL LRG LVL3 (GOWN DISPOSABLE) ×4
GOWN STRL REUS W/TWL XL LVL3 (GOWN DISPOSABLE)
KIT BASIN OR (CUSTOM PROCEDURE TRAY) ×4 IMPLANT
KIT POSITION SURG JACKSON T1 (MISCELLANEOUS) ×3 IMPLANT
KIT TURNOVER KIT B (KITS) ×4 IMPLANT
MILL MEDIUM DISP (BLADE) ×3 IMPLANT
NDL HYPO 18GX1.5 BLUNT FILL (NEEDLE) IMPLANT
NDL SPNL 18GX3.5 QUINCKE PK (NEEDLE) IMPLANT
NEEDLE HYPO 18GX1.5 BLUNT FILL (NEEDLE) IMPLANT
NEEDLE HYPO 22GX1.5 SAFETY (NEEDLE) ×4 IMPLANT
NEEDLE SPNL 18GX3.5 QUINCKE PK (NEEDLE) IMPLANT
NS IRRIG 1000ML POUR BTL (IV SOLUTION) ×4 IMPLANT
PACK LAMINECTOMY NEURO (CUSTOM PROCEDURE TRAY) ×4 IMPLANT
PAD ARMBOARD 7.5X6 YLW CONV (MISCELLANEOUS) ×12 IMPLANT
RUBBERBAND STERILE (MISCELLANEOUS) ×8 IMPLANT
SPONGE LAP 4X18 RFD (DISPOSABLE) IMPLANT
SUT MNCRL AB 3-0 PS2 18 (SUTURE) ×7 IMPLANT
SUT VIC AB 0 CT1 18XCR BRD8 (SUTURE) ×2 IMPLANT
SUT VIC AB 0 CT1 8-18 (SUTURE) ×4
SUT VIC AB 2-0 CT2 18 VCP726D (SUTURE) ×13 IMPLANT
SYR 3ML LL SCALE MARK (SYRINGE) IMPLANT
TOWEL GREEN STERILE (TOWEL DISPOSABLE) ×4 IMPLANT
TOWEL GREEN STERILE FF (TOWEL DISPOSABLE) ×4 IMPLANT
TRAY FOLEY MTR SLVR 16FR STAT (SET/KITS/TRAYS/PACK) ×3 IMPLANT
WATER STERILE IRR 1000ML POUR (IV SOLUTION) ×4 IMPLANT

## 2018-07-28 NOTE — Transfer of Care (Signed)
Immediate Anesthesia Transfer of Care Note  Patient: Keith [name]  Procedure(s) Performed: THORACIC ELEVEN- THORACIC TWELVE POSTERIOR DECOMPRESSION LAMINECTOMY (N/A Back)  Patient Location: PACU  Anesthesia Type:General  Level of Consciousness: awake, alert , oriented and patient cooperative  Airway & Oxygen Therapy: Patient Spontanous Breathing and Patient connected to nasal cannula oxygen  Post-op Assessment: Report given to RN, Post -op Vital signs reviewed and stable and Patient moving all extremities  Post vital signs: Reviewed and stable  Last Vitals:  Vitals Value Taken Time  BP 137/82 07/28/2018  6:42 PM  Temp    Pulse    Resp 16 07/28/2018  6:44 PM  SpO2    Vitals shown include unvalidated device data.  Last Pain:  Vitals:   07/28/18 1400  TempSrc: Oral  PainSc: 3       Patients Stated Pain Goal: 3 (30/05/11 0211)  Complications: No apparent anesthesia complications

## 2018-07-28 NOTE — Progress Notes (Signed)
Pt left unit at this time via bed with OR transporter to go to OR for his back surgery. Wife at bedside and she went with him.

## 2018-07-28 NOTE — Op Note (Signed)
PATIENT: Keith [name]  DAY OF SURGERY: 07/28/18   PRE-OPERATIVE DIAGNOSIS:  Thoracic myelopathy   POST-OPERATIVE DIAGNOSIS:  Thoracic myelopathy   PROCEDURE:  T11-T12 posterior thoracic decompression   SURGEON:  Surgeon(s) and Role:    Judith Part, MD - Primary    Newman Pies, MD - Assisting   ANESTHESIA: ETGA   BRIEF HISTORY: This is a 67yo man who presented with rapidly progressive leg weakness, worse in the left leg, along with sacral nerve root dysfunction. An MRI showed significant scoliosis with diffuse spondylosis, this was worst at T11-T12 where there was cord signal change, which correlated with the patient's symptoms. This was discussed with the patient as well as risks, benefits, and alternatives and wished to proceed with surgical decompression. Given his rapid progression, the decision was made to treat his acute compression and see how he does clinically before consideration of a large construct to treat his scoliosis in the same setting.   OPERATIVE DETAIL: The patient was taken to the operating room and placed on the OR table in the prone position. A formal time out was performed with two patient identifiers and confirmed the operative site. Anesthesia was induced by the anesthesia team. The operative site was marked, hair was clipped with surgical clippers, the area was then prepped and draped in a sterile fashion. Fluoroscopy was used to confirm the levels and an incision was placed in the midline of the thoracolumbar junction. Subperiosteal dissection was performed to expose the T11 and T12 levels, which were again confirmed on fluoroscopy. Laminectomies were then performed at T11 and T12, which were severely stenotic. A 31mm kerrison was required for much of the decompression due to the severity of the compression. Hemostasis was obtained and the spinal cord was noted to be well decompressed at those levels. All instrument and sponge counts were correct, the  incision was then closed in layers. The patient was then returned to anesthesia for emergence. No apparent complications at the completion of the procedure.   EBL:  22mL   DRAINS: none   SPECIMENS: none   Judith Part, MD 07/28/18 6:26 PM

## 2018-07-28 NOTE — Anesthesia Preprocedure Evaluation (Signed)
Anesthesia Evaluation  Patient identified by MRN, date of birth, ID band Patient awake    Reviewed: Allergy & Precautions, NPO status , Patient's Chart, lab work & pertinent test results, reviewed documented beta blocker date and time   History of Anesthesia Complications Negative for: history of anesthetic complications  Airway Mallampati: II  TM Distance: >3 FB Neck ROM: Full    Dental  (+) Teeth Intact   Pulmonary former smoker,    breath sounds clear to auscultation       Cardiovascular hypertension, Pt. on medications and Pt. on home beta blockers  Rhythm:Regular     Neuro/Psych negative neurological ROS  negative psych ROS   GI/Hepatic hiatal hernia, PUD,   Endo/Other  diabetes, Type 2Morbid obesity  Renal/GU      Musculoskeletal  (+) Arthritis ,   Abdominal   Peds  Hematology   Anesthesia Other Findings   Reproductive/Obstetrics                             Anesthesia Physical Anesthesia Plan  ASA: II  Anesthesia Plan: General   Post-op Pain Management:    Induction: Intravenous  PONV Risk Score and Plan: 2 and Ondansetron and Dexamethasone  Airway Management Planned: Oral ETT  Additional Equipment: None  Intra-op Plan:   Post-operative Plan:   Informed Consent: I have reviewed the patients History and Physical, chart, labs and discussed the procedure including the risks, benefits and alternatives for the proposed anesthesia with the patient or authorized representative who has indicated his/her understanding and acceptance.   Dental advisory given  Plan Discussed with: CRNA and Surgeon  Anesthesia Plan Comments:         Anesthesia Quick Evaluation

## 2018-07-28 NOTE — Anesthesia Procedure Notes (Signed)
Procedure Name: Intubation Date/Time: 07/28/2018 3:29 PM Performed by: Myna Bright, CRNA Pre-anesthesia Checklist: Patient identified, Emergency Drugs available, Suction available and Patient being monitored Patient Re-evaluated:Patient Re-evaluated prior to induction Oxygen Delivery Method: Circle system utilized Preoxygenation: Pre-oxygenation with 100% oxygen Induction Type: IV induction Ventilation: Mask ventilation without difficulty Laryngoscope Size: Mac and 4 Grade View: Grade II Tube type: Oral Tube size: 7.5 mm Number of attempts: 1 Airway Equipment and Method: Stylet Placement Confirmation: ETT inserted through vocal cords under direct vision,  positive ETCO2 and breath sounds checked- equal and bilateral Secured at: 22 cm Tube secured with: Tape Dental Injury: Teeth and Oropharynx as per pre-operative assessment

## 2018-07-28 NOTE — Care Management Important Message (Signed)
Important Message  Patient Details  Name: Keith [name] MRN: [id number] Date of Birth: 09-26-1951   Medicare Important Message Given:  Yes    [name] 07/28/2018, 3:07 PM

## 2018-07-28 NOTE — H&P (Signed)
Surgical H&P Update  HPI: 67 y.o. man with thoracic myelopathy, here for surgical treatment. Initial symptoms consisted of progressive loss of lower extremity function, worse in the left leg than the right. MRI showed severe compression at T11-12 from a combination of facet arthropathy posteriorly and a broad based disc anteriorly.  PMHx:  Past Medical History:  Diagnosis Date  . Allergy   . Chronic back pain   . Diabetes mellitus without complication ([place])   . Duodenal ulcer hemorrhage 08/29/2014  . ED (erectile dysfunction)   . Esophageal stricture 08/30/2014  . Glucose intolerance (impaired glucose tolerance)   . Hiatal hernia 08/30/2014  . Hypertension   . Hypokalemia 04/11/2013  . 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 Obesity   . Osteoarthritis   . Scoliosis 2016  . Spinal stenosis of lumbar region   . Urinary tract infection 04/11/2013   FamHx:  Family History  Problem Relation Age of Onset  . Diabetes Mother   . Asthma Mother   . Hypertension Father   . Stroke Father   . Colon cancer Neg Hx    SocHx:  reports that he has quit smoking. He has a 25.00 pack-year smoking history. He has never used smokeless tobacco. He reports that he drank alcohol. He reports that he does not use drugs.  Physical Exam: Strength 5/5 x4 except 3/5 in LLE at HF / KE / KF, 4/5 in PF/DF but also limited by left ankle deformity from prior injury SILT except for diffuse numbness in all dermatomes of the left leg from just above the inguinal ligament down to all of his toes Reflexes 3+ at the knees bilaterally and toes upgoing bilaterally, no hoffman's  Assesment/Plan: 67 y.o. man with thoracic myelopathy, here for surgical treatment. Risks, benefits, and alternatives discussed and the patient would like to continue with surgery. -OR today -3W post-op if stable exam  Judith Part, MD 07/28/18 3:11 PM

## 2018-07-28 NOTE — Brief Op Note (Signed)
07/25/2018 - 07/28/2018  6:31 PM  PATIENT:  Keith [name]  67 y.o. male  PRE-OPERATIVE DIAGNOSIS:  spinal cord compression  POST-OPERATIVE DIAGNOSIS:  spinal cord compression  PROCEDURE:  Procedure(s): THORACIC ELEVEN- THORACIC TWELVE POSTERIOR DECOMPRESSION LAMINECTOMY (N/A)  SURGEON:  Surgeon(s) and Role:    * Judith Part, MD - Primary  PHYSICIAN ASSISTANT:   ASSISTANTS: Newman Pies, MD   ANESTHESIA:   general  EBL:  400 mL   BLOOD ADMINISTERED:none  DRAINS: none   LOCAL MEDICATIONS USED:  LIDOCAINE   SPECIMEN:  No Specimen  DISPOSITION OF SPECIMEN:  N/A  COUNTS:  YES  TOURNIQUET:  * No tourniquets in log *  DICTATION: .Note written in EPIC  PLAN OF CARE: Admit to inpatient   PATIENT DISPOSITION:  PACU - hemodynamically stable.   Delay start of Pharmacological VTE agent (>24hrs) due to surgical blood loss or risk of bleeding: yes

## 2018-07-28 NOTE — Progress Notes (Signed)
Neurosurgery Service Post-operative progress note  Assessment & Plan: 67 y.o. man with thoracic myelopathy 2/2 T11-12 stenosis s/p T11-T12 laminectomies, recovering well. Seen in PACU, moving BLE with good strength, no radicular pain, feeling well. -post-op care on 3C, activity as tolerated, regular diet -please call with any questions or concerns  Judith Part  07/28/18 7:21 PM

## 2018-07-29 ENCOUNTER — Encounter (HOSPITAL_COMMUNITY): Payer: Self-pay | Admitting: Neurological Surgery

## 2018-07-29 LAB — GLUCOSE, CAPILLARY
Glucose-Capillary: 156 mg/dL — ABNORMAL HIGH (ref 70–99)
Glucose-Capillary: 152 mg/dL — ABNORMAL HIGH (ref 70–99)

## 2018-07-29 MED ORDER — OXYCODONE HCL 5 MG PO TABS
15.0000 mg | ORAL_TABLET | ORAL | Status: DC | PRN
  Administered 2018-07-29 – 2018-07-31 (×7): 15 mg via ORAL
  Filled 2018-07-29 (×7): qty 3

## 2018-07-29 MED ORDER — INSULIN ASPART 100 UNIT/ML ~~LOC~~ SOLN
0.0000 [IU] | Freq: Three times a day (TID) | SUBCUTANEOUS | Status: DC
  Administered 2018-07-30 (×2): 1 [IU] via SUBCUTANEOUS
  Administered 2018-07-30: 2 [IU] via SUBCUTANEOUS
  Administered 2018-07-31 (×2): 1 [IU] via SUBCUTANEOUS

## 2018-07-29 MED ORDER — OXYCODONE HCL 5 MG PO TABS
10.0000 mg | ORAL_TABLET | ORAL | Status: DC | PRN
  Administered 2018-07-31: 10 mg via ORAL
  Filled 2018-07-29: qty 2

## 2018-07-29 MED ORDER — INSULIN ASPART 100 UNIT/ML ~~LOC~~ SOLN: 0.0000 [IU] | Freq: Every day | SUBCUTANEOUS | Status: DC

## 2018-07-29 NOTE — Progress Notes (Signed)
Neurosurgery Service Progress Note  Subjective: No acute events overnight, some back pain that is worse with laying flat, improved with sitting up and standing, feels that his LLE strength is significantly improved, states it's the first time he has felt his left toes in years  Objective: Vitals:   07/28/18 2023 07/28/18 2346 07/29/18 0325 07/29/18 0820  BP: (!) 157/97 137/76 114/65 138/84  Pulse: 86 92 91 99  Resp: 20 20 20 20   Temp: 98.6 F (37 C) 98.2 F (36.8 C) 99 F (37.2 C) 98.8 F (37.1 C)  TempSrc: Oral Oral Oral Oral  SpO2: 98% 94% 98% 93%  Weight:      Height:       Temp (24hrs), Avg:98.3 F (36.8 C), Min:97 F (36.1 C), Max:99.5 F (37.5 C)  CBC Latest Ref Rng & Units 07/28/2018 07/26/2018 07/25/2018  WBC 4.0 - 10.5 K/uL - 7.5 10.0  Hemoglobin 13.0 - 17.0 g/dL 12.6(L) 12.4(L) 13.0  Hematocrit 39.0 - 52.0 % 37.0(L) 38.7(L) 40.8  Platelets 150 - 400 K/uL - 338 360   BMP Latest Ref Rng & Units 07/28/2018 07/26/2018 07/25/2018  Glucose 70 - 99 mg/dL 134(H) 116(H) 108(H)  BUN 8 - 23 mg/dL - 13 17  Creatinine 0.61 - 1.24 mg/dL - 1.04 1.21  Sodium 135 - 145 mmol/L 140 138 140  Potassium 3.5 - 5.1 mmol/L 3.1(L) 3.0(L) 3.5  Chloride 98 - 111 mmol/L - 101 106  CO2 22 - 32 mmol/L - 28 24  Calcium 8.9 - 10.3 mg/dL - 9.9 10.0    Intake/Output Summary (Last 24 hours) at 07/29/2018 0856 Last data filed at 07/29/2018 0645 Gross per 24 hour  Intake 1246.63 ml  Output 2325 ml  Net -1078.37 ml    Current Facility-Administered Medications:  .  0.9 %  sodium chloride infusion, , Intravenous, PRN, Judith Part, MD, Last Rate: 10 mL/hr at 07/29/18 0512 .  atenolol (TENORMIN) tablet 50 mg, 50 mg, Oral, Daily, 50 mg at 07/28/18 1236 **AND** chlorthalidone (HYGROTON) tablet 25 mg, 25 mg, Oral, Daily, Tierre Gerard, Joyice Faster, MD, 25 mg at 07/28/18 1236 .  cloNIDine (CATAPRES) tablet 0.1 mg, 0.1 mg, Oral, Daily, Obie Kallenbach A, MD, 0.1 mg at 07/28/18 1237 .  docusate  sodium (COLACE) capsule 100 mg, 100 mg, Oral, BID, Judith Part, MD, 100 mg at 07/28/18 2319 .  DULoxetine (CYMBALTA) DR capsule 30 mg, 30 mg, Oral, Daily, Judith Part, MD, 30 mg at 07/28/18 1237 .  HYDROmorphone (DILAUDID) injection 0.5 mg, 0.5 mg, Intravenous, Q3H PRN, Judith Part, MD, 0.5 mg at 07/29/18 0807 .  metFORMIN (GLUCOPHAGE) tablet 250 mg, 250 mg, Oral, Q breakfast, Judith Part, MD, 250 mg at 07/27/18 0836 .  methocarbamol (ROBAXIN) tablet 500 mg, 500 mg, Oral, Q8H PRN, Bergman, Meghan D, NP .  mupirocin ointment (BACTROBAN) 2 % 1 application, 1 application, Nasal, BID, Judith Part, MD, 1 application at 42/59/56 2320 .  ondansetron (ZOFRAN) tablet 4 mg, 4 mg, Oral, Q6H PRN **OR** ondansetron (ZOFRAN) injection 4 mg, 4 mg, Intravenous, Q6H PRN, Bernd Crom A, MD .  oxyCODONE (Oxy IR/ROXICODONE) immediate release tablet 10 mg, 10 mg, Oral, Q4H PRN, Judith Part, MD, 10 mg at 07/29/18 3875 .  oxyCODONE (Oxy IR/ROXICODONE) immediate release tablet 5 mg, 5 mg, Oral, Q4H PRN, Judith Part, MD, 5 mg at 07/28/18 0855 .  polyethylene glycol (MIRALAX / GLYCOLAX) packet 17 g, 17 g, Oral, Daily PRN, Judith Part, MD .  simvastatin (ZOCOR) tablet 20 mg, 20 mg, Oral, QHS, Lorance Pickeral, Joyice Faster, MD, 20 mg at 07/28/18 2319   Physical Exam: Strength 5/5 in BUE and RLE LLE diffusely 4/5 but limited distally by left ankle deformity SILT except for decreased sensation in LLE   Assessment & Plan: 67 y.o. 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 MRI T-spine w/ contrast: no mass. 10/21 s/p T11-12 laminectomies -strength and sensation improved this morning, appears to be an excellent surgical result -ambulate as tolerated, PT/OT -will increase pain regimen -reg diet -SCDs/TEDs, start [place]  07/29/18 8:56 AM

## 2018-07-29 NOTE — Evaluation (Signed)
Occupational Therapy Evaluation Patient Details Name: Keith [name] MRN: [id number] DOB: [birth year] Today's Date: 07/29/2018    History of Present Illness 67 y.o. man that presents worsening left leg weakness now s/p T11-12 laminectomies on 10/21.    Clinical Impression   Patient presenting with decreased I in self care, balance, functional transfers/mobility, safety awareness, and precautions.  Patient was independent PTA. Patient currently functioning supervision - min A with use of RW. Pt educated on use of AE to increase I with self care.Patient will benefit from acute OT to increase overall independence in the areas of ADLs, functional mobility, and safety in order to safely discharge home with family.    Follow Up Recommendations  No OT follow up;Supervision - Intermittent    Equipment Recommendations  Tub/shower seat . OT recommended purchase of sock aid and LH reacher   Recommendations for Other Services Other (comment)(none)     Precautions / Restrictions Precautions Precautions: Back Precaution Booklet Issued: Yes (comment) Precaution Comments: Pt educated on back precautions, reviewed handout, pt verbalized understanding  Restrictions Weight Bearing Restrictions: No      Mobility Bed Mobility        General bed mobility comments: Pt up in chair upon arrival  Transfers Overall transfer level: Needs assistance Equipment used: Rolling walker (2 wheeled) Transfers: Sit to/from Stand Sit to Stand: Min guard              Balance Overall balance assessment: Modified Independent Sitting-balance support: No upper extremity supported;Feet supported Sitting balance-Leahy Scale: Good     Standing balance support: Bilateral upper extremity supported Standing balance-Leahy Scale: Fair        ADL either performed or assessed with clinical judgement   ADL Overall ADL's : Needs assistance/impaired         Upper Body Bathing: Set up;Sitting   Lower  Body Bathing: Moderate assistance;Sit to/from stand   Upper Body Dressing : Set up;Sitting   Lower Body Dressing: Minimal assistance;Sit to/from stand   Toilet Transfer: Min guard      General ADL Comments: OT educated pt on use of LH reacher and sock aide to increase I in self care with pt returning demonstrations with min verbal cuing for technique     Vision Baseline Vision/History: No visual deficits              Pertinent Vitals/Pain Pain Assessment: No/denies pain Pain Score: 5  Pain Location: back  Pain Descriptors / Indicators: Aching;Discomfort;Dull     Hand Dominance Right   Extremity/Trunk Assessment Upper Extremity Assessment Upper Extremity Assessment: Defer to OT evaluation   Lower Extremity Assessment Lower Extremity Assessment: Overall WFL for tasks assessed   Cervical / Trunk Assessment Cervical / Trunk Assessment: Normal   Communication Communication Communication: No difficulties   Cognition Arousal/Alertness: Awake/alert Behavior During Therapy: WFL for tasks assessed/performed Overall Cognitive Status: Within Functional Limits for tasks assessed                   Home Living Family/patient expects to be discharged to:: Private residence Living Arrangements: Spouse/significant other Available Help at Discharge: Family;Available 24 hours/day Type of Home: House Home Access: Stairs to enter;Ramped entrance Entrance Stairs-Number of Steps: 3-4 Entrance Stairs-Rails: Right Home Layout: Two level;Able to live on main level with bedroom/bathroom     Bathroom Shower/Tub: Tub/shower unit   Bathroom Toilet: Standard     Home Equipment: Walker - 4 wheels          Prior Functioning/Environment Level  of Independence: Independent with assistive device(s)        Comments: Pt reports use of [place] at home and for mobility          OT Problem List: Decreased strength;Decreased knowledge of use of DME or AE;Decreased knowledge of  precautions;Decreased activity tolerance;Impaired balance (sitting and/or standing);Decreased safety awareness;Pain      OT Treatment/Interventions: Self-care/ADL training;Balance training;Therapeutic activities;Energy conservation;DME and/or AE instruction;Patient/family education    OT Goals(Current goals can be found in the care plan section) Acute Rehab OT Goals Patient Stated Goal: To return home without pain  OT Goal Formulation: With patient Time For Goal Achievement: 08/12/18 Potential to Achieve Goals: Good ADL Goals Pt Will Perform Lower Body Bathing: with supervision Pt Will Perform Lower Body Dressing: with supervision Pt Will Transfer to Toilet: with supervision Pt Will Perform Toileting - Clothing Manipulation and hygiene: with supervision Pt Will Perform Tub/Shower Transfer: with supervision  OT Frequency: Min 2X/week   Barriers to D/C:    none known at this time          AM-PAC PT "6 Clicks" Daily Activity     Outcome Measure Help from another person eating meals?: None Help from another person taking care of personal grooming?: None Help from another person toileting, which includes using toliet, bedpan, or urinal?: A Little Help from another person bathing (including washing, rinsing, drying)?: A Little Help from another person to put on and taking off regular upper body clothing?: A Little Help from another person to put on and taking off regular lower body clothing?: A Little 6 Click Score: 20   End of Session Equipment Utilized During Treatment: Rolling walker  Activity Tolerance: Patient tolerated treatment well Patient left: in chair;with call bell/phone within reach  OT Visit Diagnosis: Muscle weakness (generalized) (M62.81)                Time: 8416-6063 OT Time Calculation (min): 17 min Charges:  OT General Charges $OT Visit: 1 Visit OT Evaluation $OT Eval Low Complexity: 1 Low   Nhu Glasby P, MS, OTR/L 07/29/2018, 3:56 PM

## 2018-07-29 NOTE — Evaluation (Signed)
Physical Therapy Evaluation Patient Details Name: Keith [name] MRN: [id number] DOB: May 11, 1951 Today's Date: 07/29/2018   History of Present Illness  67 y.o. man that presents worsening left leg weakness now s/p T11-12 laminectomies on 10/21.   Clinical Impression   Pt evaluated today for mobility and education for s/p spinal surgery. Pt performed ambulation with RW with no physical assist and stair negotiation. Pt will benefit from continued PT services until appropriate for discharge. PT plan of care appropriate for patient to return home safely.      Follow Up Recommendations No PT follow up;Supervision - Intermittent    Equipment Recommendations  None recommended by PT    Recommendations for Other Services       Precautions / Restrictions Precautions Precautions: Back Precaution Booklet Issued: Yes (comment) Precaution Comments: Pt educated on back precautions, given handout at end of session, pt verbalized understanding  Restrictions Weight Bearing Restrictions: No      Mobility  Bed Mobility               General bed mobility comments: Pt up in chair upon arrival  Transfers Overall transfer level: Needs assistance Equipment used: Rolling walker (2 wheeled);1 person hand held assist Transfers: Sit to/from Stand Sit to Stand: Min guard         General transfer comment: Pt min guard in sit to stand transfers for increased time to initiate to standing, pt shows increased time to extend trunk from flexed position to standing. Uses arms of chair to support UE and then transfers UE to RW for stability and balance control.   Ambulation/Gait Ambulation/Gait assistance: Supervision Gait Distance (Feet): 200 Feet Assistive device: Rolling walker (2 wheeled) Gait Pattern/deviations: Step-to pattern;Step-through pattern;Decreased stride length;Decreased weight shift to left;Narrow base of support   Gait velocity interpretation: <1.31 ft/sec, indicative of  household ambulator General Gait Details: Pt able to initiate mobility, requires verbal cues to increase stride length and relies heavily on RW for UE support. Pt cued to keep base of support within walker space to prevent loss of balance. Pt showed a slight increase in step cadence as evaluation proceeded.    Stairs Stairs: Yes Stairs assistance: Min guard(relied on bilateral hand rails ) Stair Management: Two rails;Step to pattern Number of Stairs: 4 General stair comments: Pt able to negotiate stairs with min guard for safety and external support of bilateral hand rails to ascend stairs, relied heavily on UE strength to step up to stair.  Wheelchair Mobility    Modified Rankin (Stroke Patients Only)       Balance Overall balance assessment: Modified Independent Sitting-balance support: No upper extremity supported;Feet supported Sitting balance-Leahy Scale: Good     Standing balance support: Bilateral upper extremity supported Standing balance-Leahy Scale: Fair Standing balance comment: Pt mod independent with RW, able to maintain good standing balance when standing with RW, pt attempted standing without AD and expressed an increase in pain and mild instability.                               Pertinent Vitals/Pain Pain Assessment: 0-10 Pain Score: 6  Pain Location: back  Pain Descriptors / Indicators: Discomfort;Nagging;Pressure    Home Living Family/patient expects to be discharged to:: Private residence Living Arrangements: Spouse/significant other Available Help at Discharge: Family;Available 24 hours/day(daughter in from out of town ) Type of Home: House Home Access: Stairs to enter;Ramped entrance Entrance Stairs-Rails: Right Entrance Stairs-Number of Steps:  3-4 Home Layout: Two level;Able to live on main level with bedroom/bathroom Home Equipment: Walker - 4 wheels      Prior Function Level of Independence: Independent with assistive device(s)          Comments: Pt reports use of [employer] at home and for mobility       Hand Dominance   Dominant Hand: Right    Extremity/Trunk Assessment   Upper Extremity Assessment Upper Extremity Assessment: Defer to OT evaluation    Lower Extremity Assessment Lower Extremity Assessment: Overall WFL for tasks assessed    Cervical / Trunk Assessment Cervical / Trunk Assessment: Normal  Communication   Communication: No difficulties  Cognition Arousal/Alertness: Awake/alert Behavior During Therapy: WFL for tasks assessed/performed Overall Cognitive Status: Within Functional Limits for tasks assessed                                        General Comments      Exercises     Assessment/Plan    PT Assessment Patient needs continued PT services  PT Problem List Pain;Decreased mobility;Decreased activity tolerance;Decreased balance       PT Treatment Interventions Gait training;Stair training;Functional mobility training;Therapeutic exercise;Patient/family education    PT Goals (Current goals can be found in the Care Plan section)  Acute Rehab PT Goals Patient Stated Goal: To return home without pain  PT Goal Formulation: With patient Time For Goal Achievement: 08/12/18 Potential to Achieve Goals: Good    Frequency Min 5X/week   Barriers to discharge   None    Co-evaluation               AM-PAC PT "6 Clicks" Daily Activity  Outcome Measure Difficulty turning over in bed (including adjusting bedclothes, sheets and blankets)?: A Little Difficulty moving from lying on back to sitting on the side of the bed? : A Little Difficulty sitting down on and standing up from a chair with arms (e.g., wheelchair, bedside commode, etc,.)?: A Little Help needed moving to and from a bed to chair (including a wheelchair)?: A Little Help needed walking in hospital room?: A Little Help needed climbing 3-5 steps with a railing? : A Little 6 Click Score: 18     End of Session Equipment Utilized During Treatment: Gait belt Activity Tolerance: Patient tolerated treatment well Patient left: in chair;with call bell/phone within reach Nurse Communication: Mobility status PT Visit Diagnosis: Other abnormalities of gait and mobility (R26.89);Pain;Muscle weakness (generalized) (M62.81);Unsteadiness on feet (R26.81) Pain - part of body: (Back )    Time: 5400-8676 PT Time Calculation (min) (ACUTE ONLY): 24 min   Charges:   PT Evaluation $PT Eval Moderate Complexity: 1 Mod PT Treatments $Gait Training: 8-22 mins        Alanda Slim, SPT   Keith [name] 07/29/2018, 11:14 AM

## 2018-07-30 LAB — GLUCOSE, CAPILLARY
Glucose-Capillary: 142 mg/dL — ABNORMAL HIGH (ref 70–99)
Glucose-Capillary: 166 mg/dL — ABNORMAL HIGH (ref 70–99)
Glucose-Capillary: 130 mg/dL — ABNORMAL HIGH (ref 70–99)
Glucose-Capillary: 114 mg/dL — ABNORMAL HIGH (ref 70–99)

## 2018-07-30 MED ORDER — HEPARIN SODIUM (PORCINE) 5000 UNIT/ML IJ SOLN
5000.0000 [IU] | Freq: Three times a day (TID) | INTRAMUSCULAR | Status: DC
  Administered 2018-07-30 – 2018-07-31 (×4): 5000 [IU] via SUBCUTANEOUS
  Filled 2018-07-30 (×4): qty 1

## 2018-07-30 NOTE — Progress Notes (Signed)
Neurosurgery Service Progress Note  Subjective: No acute events overnight, better pain control on higher regimen, ambulating well, tolerating regular diet  Objective: Vitals:   07/29/18 2027 07/29/18 2319 07/30/18 0305 07/30/18 0730  BP: 133/76 137/79 127/84 (!) 111/58  Pulse: 90 88 86 83  Resp: 20 18 20 18   Temp: 98.8 F (37.1 C) 99.9 F (37.7 C) 98.5 F (36.9 C) 99.1 F (37.3 C)  TempSrc: Oral Oral Oral Oral  SpO2: 96% 94% 97% 93%  Weight:      Height:       Temp (24hrs), Avg:99.5 F (37.5 C), Min:98.5 F (36.9 C), Max:100.3 F (37.9 C)  CBC Latest Ref Rng & Units 07/28/2018 07/26/2018 07/25/2018  WBC 4.0 - 10.5 K/uL - 7.5 10.0  Hemoglobin 13.0 - 17.0 g/dL 12.6(L) 12.4(L) 13.0  Hematocrit 39.0 - 52.0 % 37.0(L) 38.7(L) 40.8  Platelets 150 - 400 K/uL - 338 360   BMP Latest Ref Rng & Units 07/28/2018 07/26/2018 07/25/2018  Glucose 70 - 99 mg/dL 134(H) 116(H) 108(H)  BUN 8 - 23 mg/dL - 13 17  Creatinine 0.61 - 1.24 mg/dL - 1.04 1.21  Sodium 135 - 145 mmol/L 140 138 140  Potassium 3.5 - 5.1 mmol/L 3.1(L) 3.0(L) 3.5  Chloride 98 - 111 mmol/L - 101 106  CO2 22 - 32 mmol/L - 28 24  Calcium 8.9 - 10.3 mg/dL - 9.9 10.0    Intake/Output Summary (Last 24 hours) at 07/30/2018 0858 Last data filed at 07/30/2018 0853 Gross per 24 hour  Intake 140 ml  Output 1525 ml  Net -1385 ml    Current Facility-Administered Medications:  .  0.9 %  sodium chloride infusion, , Intravenous, PRN, Judith Part, MD, Last Rate: 10 mL/hr at 07/29/18 0512 .  atenolol (TENORMIN) tablet 50 mg, 50 mg, Oral, Daily, 50 mg at 07/29/18 0902 **AND** chlorthalidone (HYGROTON) tablet 25 mg, 25 mg, Oral, Daily, Judith Part, MD, 25 mg at 07/29/18 0903 .  cloNIDine (CATAPRES) tablet 0.1 mg, 0.1 mg, Oral, Daily, Judith Part, MD, 0.1 mg at 07/29/18 0902 .  docusate sodium (COLACE) capsule 100 mg, 100 mg, Oral, BID, Judith Part, MD, 100 mg at 07/29/18 2015 .  DULoxetine (CYMBALTA)  DR capsule 30 mg, 30 mg, Oral, Daily, Judith Part, MD, 30 mg at 07/29/18 0903 .  HYDROmorphone (DILAUDID) injection 0.5 mg, 0.5 mg, Intravenous, Q3H PRN, Judith Part, MD, 0.5 mg at 07/29/18 2213 .  insulin aspart (novoLOG) injection 0-5 Units, 0-5 Units, Subcutaneous, QHS, Kimbra Marcelino A, MD .  insulin aspart (novoLOG) injection 0-9 Units, 0-9 Units, Subcutaneous, TID WC, Judith Part, MD, 1 Units at 07/30/18 [phone] .  metFORMIN (GLUCOPHAGE) tablet 250 mg, 250 mg, Oral, Q breakfast, Judith Part, MD, 250 mg at 07/30/18 0757 .  methocarbamol (ROBAXIN) tablet 500 mg, 500 mg, Oral, Q8H PRN, Reinaldo Meeker, Meghan D, NP, 500 mg at 07/30/18 0619 .  mupirocin ointment (BACTROBAN) 2 % 1 application, 1 application, Nasal, BID, Judith Part, MD, 1 application at 27/74/12 2018 .  ondansetron (ZOFRAN) tablet 4 mg, 4 mg, Oral, Q6H PRN **OR** ondansetron (ZOFRAN) injection 4 mg, 4 mg, Intravenous, Q6H PRN, Roopa Graver A, MD .  oxyCODONE (Oxy IR/ROXICODONE) immediate release tablet 10 mg, 10 mg, Oral, Q4H PRN, Judith Part, MD .  oxyCODONE (Oxy IR/ROXICODONE) immediate release tablet 15 mg, 15 mg, Oral, Q4H PRN, Judith Part, MD, 15 mg at 07/30/18 0620 .  polyethylene glycol (MIRALAX / GLYCOLAX)  packet 17 g, 17 g, Oral, Daily PRN, Judith Part, MD .  simvastatin (ZOCOR) tablet 20 mg, 20 mg, Oral, QHS, Mathilde Mcwherter, Joyice Faster, MD, 20 mg at 07/29/18 2016   Physical Exam: Strength 5/5 in BUE and RLE LLE diffusely 4/5 but limited distally by left ankle deformity SILT except for decreased sensation in LLE, normal sensation in great toe  Assessment & Plan: 67 y.o. 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 MRI T-spine w/ contrast: no mass. 10/21 s/p T11-12 laminectomies -exam continues to improve -ambulate as tolerated, PT/OT -reg diet -SCDs/TEDs, start SQH -likely discharge tomorrow  Judith Part   07/30/18 8:58 AM

## 2018-07-30 NOTE — Anesthesia Postprocedure Evaluation (Signed)
Anesthesia Post Note  Patient: Keith [name]  Procedure(s) Performed: THORACIC ELEVEN- THORACIC TWELVE POSTERIOR DECOMPRESSION LAMINECTOMY (N/A Back)     Patient location during evaluation: PACU Anesthesia Type: General Level of consciousness: awake and alert Pain management: pain level controlled Vital Signs Assessment: post-procedure vital signs reviewed and stable Respiratory status: spontaneous breathing, nonlabored ventilation, respiratory function stable and patient connected to nasal cannula oxygen Cardiovascular status: blood pressure returned to baseline and stable Postop Assessment: no apparent nausea or vomiting Anesthetic complications: no    Last Vitals:  Vitals:   07/30/18 0730 07/30/18 1239  BP: (!) 111/58 124/72  Pulse: 83 85  Resp: 18 18  Temp: 37.3 C 37.5 C  SpO2: 93% 93%    Last Pain:  Vitals:   07/30/18 1603  TempSrc:   PainSc: 5                  Tiajuana Amass

## 2018-07-30 NOTE — Progress Notes (Signed)
Physical Therapy Treatment Patient Details Name: Keith [name] MRN: [id number] DOB: Mar 03, 1951 Today's Date: 07/30/2018    History of Present Illness Pt is a 67 y.o. man that presents worsening left leg weakness now s/p T11-12 laminectomies on 10/21. PMH including but not limited to DM, HTN and scoliosis.    PT Comments    Pt continuing to make steady progress with functional mobility. Pt able to recall 3/3 back precautions. PT reviewed a generalized walking program with pt to initiate upon d/c. PT will continue to follow acutely to progress mobility as tolerated.    Follow Up Recommendations  No PT follow up;Supervision - Intermittent     Equipment Recommendations  None recommended by PT    Recommendations for Other Services       Precautions / Restrictions Precautions Precautions: Back Precaution Comments: Pt able to recall 3/3 back precautions Restrictions Weight Bearing Restrictions: No    Mobility  Bed Mobility               General bed mobility comments: Pt up in chair upon arrival  Transfers Overall transfer level: Needs assistance Equipment used: Rolling walker (2 wheeled) Transfers: Sit to/from Stand Sit to Stand: Min guard         General transfer comment: min guard for safety, good technique utilized  Ambulation/Gait Ambulation/Gait assistance: Counsellor (Feet): 200 Feet Assistive device: Rolling walker (2 wheeled) Gait Pattern/deviations: Step-through pattern;Decreased step length - right;Decreased step length - left;Decreased stride length Gait velocity: decreased   General Gait Details: pt generally steady with RW, cueing to relax shoulders and for improved posture, min guard for safety   Stairs Stairs: Yes Stairs assistance: Min guard Stair Management: Two rails;Step to pattern;Forwards Number of Stairs: 2 General stair comments: min guard for safety; pt with increased pain with stairs, heavy reliance on hand  rails   Wheelchair Mobility    Modified Rankin (Stroke Patients Only)       Balance Overall balance assessment: Needs assistance Sitting-balance support: Feet supported Sitting balance-Leahy Scale: Good     Standing balance support: Bilateral upper extremity supported Standing balance-Leahy Scale: Poor                              Cognition Arousal/Alertness: Awake/alert Behavior During Therapy: WFL for tasks assessed/performed Overall Cognitive Status: Within Functional Limits for tasks assessed                                        Exercises      General Comments General comments (skin integrity, edema, etc.): adjusted RW to appropriate height      Pertinent Vitals/Pain Pain Assessment: 0-10 Pain Score: 4  Pain Location: back  Pain Descriptors / Indicators: Aching;Discomfort;Dull Pain Intervention(s): Monitored during session;Repositioned    Home Living                      Prior Function            PT Goals (current goals can now be found in the care plan section) Acute Rehab PT Goals PT Goal Formulation: With patient Time For Goal Achievement: 08/12/18 Potential to Achieve Goals: Good Progress towards PT goals: Progressing toward goals    Frequency    Min 5X/week      PT Plan Current plan remains appropriate  Co-evaluation              AM-PAC PT "6 Clicks" Daily Activity  Outcome Measure  Difficulty turning over in bed (including adjusting bedclothes, sheets and blankets)?: A Little Difficulty moving from lying on back to sitting on the side of the bed? : A Little Difficulty sitting down on and standing up from a chair with arms (e.g., wheelchair, bedside commode, etc,.)?: Unable Help needed moving to and from a bed to chair (including a wheelchair)?: A Little Help needed walking in hospital room?: A Little Help needed climbing 3-5 steps with a railing? : A Little 6 Click Score: 16    End  of Session Equipment Utilized During Treatment: Gait belt Activity Tolerance: Patient limited by pain Patient left: in chair;with call bell/phone within reach;with family/visitor present Nurse Communication: Mobility status PT Visit Diagnosis: Other abnormalities of gait and mobility (R26.89);Pain Pain - part of body: (back)     Time: 3300-7622 PT Time Calculation (min) (ACUTE ONLY): 15 min  Charges:  $Gait Training: 8-22 mins                     Sherie Don, [place], DPT  Acute Rehabilitation Services Pager [phone] Office [place] 07/30/2018, 10:41 AM

## 2018-07-30 NOTE — Discharge Summary (Signed)
Discharge Summary  Date of Admission: 07/25/2018  Date of Discharge: 07/31/2018  Attending Physician: Emelda Brothers, MD  Hospital Course: Patient was admitted for rapidly progressive left leg weakness and overflow incontinence. MRI showed multiple areas of stenosis with severe stenosis at T11-12 with cord signal change. MRI with contrast showed no underlying lesion. Pt was taken to the OR for an uncomplicated G89-16 laminectomy. He was recovered in PACU and transferred to a regular nursing floor. His pre-operative weakness and numbness in his left leg began improving immediately after surgery. He was seen by PT/OT who recommended discharge home. His hospital course was uncomplicated and the patient was discharged home on POD3 (07/31/18). He will follow up in clinic with me in 2 weeks.  Neurologic exam at discharge:  AOx3, PERRL, EOMI, FS, TM LLE diffusely 4/5 but limited distally by left ankle deformity SILT except for decreased sensation in LLE, normal sensation in great toe  Judith Part, MD 07/30/18 5:24 PM

## 2018-07-30 NOTE — Progress Notes (Signed)
Occupational Therapy Treatment Patient Details Name: Keith [name] MRN: [id number] DOB: 1950-11-26 Today's Date: 07/30/2018    History of present illness Pt is a 67 y.o. man that presents worsening left leg weakness now s/p T11-12 laminectomies on 10/21. PMH including but not limited to DM, HTN and scoliosis.   OT comments  Pt requires min guard assist for functional transfers.  Recommend use of AE for LB ADLs (he is aware of where to purchase), and recommend use of shower seat for shower.   Follow Up Recommendations  No OT follow up;Supervision - Intermittent    Equipment Recommendations  Tub/shower seat    Recommendations for Other Services Other (comment)    Precautions / Restrictions Precautions Precautions: Back Precaution Booklet Issued: Yes (comment) Precaution Comments: Pt able to recall 3/3 precautions, and demonstrates good awareness of precautions  Restrictions Weight Bearing Restrictions: No       Mobility Bed Mobility               General bed mobility comments: Pt up in chair upon arrival  Transfers Overall transfer level: Needs assistance Equipment used: Rolling walker (2 wheeled) Transfers: Sit to/from Stand;Stand Pivot Transfers Sit to Stand: Min guard Stand pivot transfers: Min guard       General transfer comment: min guard assist for safety     Balance Overall balance assessment: Needs assistance Sitting-balance support: Feet supported Sitting balance-Leahy Scale: Good     Standing balance support: Bilateral upper extremity supported Standing balance-Leahy Scale: Poor Standing balance comment: requires min A for static standing                            ADL either performed or assessed with clinical judgement   ADL Overall ADL's : Needs assistance/impaired                       Lower Body Dressing Details (indicate cue type and reason): Pt able to verbalize understanding of use of AE and where to purchase   Toilet Transfer: Min guard;Ambulation;Regular Toilet;BSC       Tub/ Shower Transfer: Walk-in shower;Min guard;Shower seat;Ambulation;Rolling walker Tub/Shower Transfer Details (indicate cue type and reason): simulated shower in standing, but requires min A for balance.  Recommend use of shower seat.  Pt reports his aunt has one he can borrow.  Functional mobility during ADLs: Passenger transport manager     Praxis      Cognition Arousal/Alertness: Awake/alert Behavior During Therapy: WFL for tasks assessed/performed Overall Cognitive Status: Within Functional Limits for tasks assessed                                          Exercises     Shoulder Instructions       General Comments wife very supportive     Pertinent Vitals/ Pain       Pain Assessment: 0-10 Pain Score: 5  Pain Location: back  Pain Descriptors / Indicators: Aching;Discomfort;Dull Pain Intervention(s): Monitored during session;Repositioned;Patient requesting pain meds-RN notified  Home Living  Prior Functioning/Environment              Frequency  Min 2X/week        Progress Toward Goals  OT Goals(current goals can now be found in the care plan section)  Progress towards OT goals: Progressing toward goals     Plan Discharge plan remains appropriate    Co-evaluation                 AM-PAC PT "6 Clicks" Daily Activity     Outcome Measure   Help from another person eating meals?: None Help from another person taking care of personal grooming?: None Help from another person toileting, which includes using toliet, bedpan, or urinal?: A Little Help from another person bathing (including washing, rinsing, drying)?: A Little Help from another person to put on and taking off regular upper body clothing?: A Little Help from another person to put on and taking off regular lower  body clothing?: A Little 6 Click Score: 20    End of Session Equipment Utilized During Treatment: Rolling walker  OT Visit Diagnosis: Muscle weakness (generalized) (M62.81)   Activity Tolerance Patient tolerated treatment well   Patient Left in chair;with call bell/phone within reach   Nurse Communication Mobility status;Patient requests pain meds        Time: 1153-1210 OT Time Calculation (min): 17 min  Charges: OT General Charges $OT Visit: 1 Visit OT Treatments $Self Care/Home Management : 8-22 mins  Lucille Passy, OTR/L [place] Pager [phone] Office [phone]    Lucille Passy M 07/30/2018, 12:22 PM

## 2018-07-30 NOTE — Progress Notes (Signed)
Received report from [facility], patient resting quietly in room no needs expressed.  Meshach Perry, Tivis Ringer, RN

## 2018-07-31 LAB — GLUCOSE, CAPILLARY
Glucose-Capillary: 155 mg/dL — ABNORMAL HIGH (ref 70–99)
Glucose-Capillary: 128 mg/dL — ABNORMAL HIGH (ref 70–99)
Glucose-Capillary: 124 mg/dL — ABNORMAL HIGH (ref 70–99)

## 2018-07-31 MED ORDER — OXYCODONE HCL 10 MG PO TABS: 10.0000 mg | ORAL_TABLET | ORAL | 0 refills | Status: AC | PRN

## 2018-07-31 MED ORDER — DOCUSATE SODIUM 100 MG PO CAPS: 100.0000 mg | ORAL_CAPSULE | Freq: Two times a day (BID) | ORAL | 0 refills | Status: DC

## 2018-07-31 MED ORDER — METHOCARBAMOL 500 MG PO TABS: 500.0000 mg | ORAL_TABLET | Freq: Three times a day (TID) | ORAL | 1 refills | Status: DC | PRN

## 2018-07-31 MED FILL — METHOCARBAMOL 500 MG TABS: 500 | 10 days supply | Qty: 30 | Fill #0

## 2018-07-31 MED FILL — oxyCODONE HCL 10 MG TABS: 10 | 7 days supply | Qty: 30 | Fill #0

## 2018-07-31 NOTE — Progress Notes (Signed)
Physical Therapy Treatment Patient Details Name: Keith [name] MRN: [id number] DOB: 07/17/1951 Today's Date: 07/31/2018    History of Present Illness Pt is a 67 y.o. man that presents worsening left leg weakness now s/p T11-12 laminectomies on 10/21. PMH including but not limited to DM, HTN and scoliosis.    PT Comments    Pt continuing to make steady progress with functional mobility. PT discussed generalized walking program for pt to initiate upon d/c. PT also reviewed log roll technique to maintain back precautions with bed mobility. Plan is for pt to d/c home today with family support.  Pt is ready to d/c home from PT perspective.    Follow Up Recommendations  No PT follow up;Supervision - Intermittent     Equipment Recommendations  None recommended by PT    Recommendations for Other Services       Precautions / Restrictions Precautions Precautions: Back Precaution Comments: Pt able to recall 3/3 precautions, and demonstrates good awareness of precautions  Restrictions Weight Bearing Restrictions: No    Mobility  Bed Mobility               General bed mobility comments: Pt up in chair upon arrival - verbally reviewed log roll technique for bed mobility as pt declining to practice at this time  Transfers Overall transfer level: Needs assistance Equipment used: Rolling walker (2 wheeled) Transfers: Sit to/from Stand Sit to Stand: Min guard         General transfer comment: min guard for safety; good technique  Ambulation/Gait Ambulation/Gait assistance: Min guard Gait Distance (Feet): 100 Feet Assistive device: Rolling walker (2 wheeled) Gait Pattern/deviations: Step-through pattern;Decreased step length - right;Decreased step length - left;Decreased stride length;Shuffle;Narrow base of support Gait velocity: decreased   General Gait Details: pt with shuffling gait pattern but overall steady with RW with min guard; pt had shoes donned this session  (crocs)   Stairs             Wheelchair Mobility    Modified Rankin (Stroke Patients Only)       Balance Overall balance assessment: Needs assistance Sitting-balance support: Feet supported Sitting balance-Leahy Scale: Good     Standing balance support: Bilateral upper extremity supported Standing balance-Leahy Scale: Poor                              Cognition Arousal/Alertness: Awake/alert Behavior During Therapy: WFL for tasks assessed/performed Overall Cognitive Status: Within Functional Limits for tasks assessed                                        Exercises      General Comments        Pertinent Vitals/Pain Pain Assessment: 0-10 Pain Score: 3  Pain Location: back  Pain Descriptors / Indicators: Sore Pain Intervention(s): Monitored during session;Repositioned    Home Living                      Prior Function            PT Goals (current goals can now be found in the care plan section) Acute Rehab PT Goals PT Goal Formulation: With patient Time For Goal Achievement: 08/12/18 Potential to Achieve Goals: Good Progress towards PT goals: Progressing toward goals    Frequency    Min 5X/week  PT Plan Current plan remains appropriate    Co-evaluation              AM-PAC PT "6 Clicks" Daily Activity  Outcome Measure  Difficulty turning over in bed (including adjusting bedclothes, sheets and blankets)?: A Little Difficulty moving from lying on back to sitting on the side of the bed? : A Little Difficulty sitting down on and standing up from a chair with arms (e.g., wheelchair, bedside commode, etc,.)?: Unable Help needed moving to and from a bed to chair (including a wheelchair)?: A Little Help needed walking in hospital room?: A Little Help needed climbing 3-5 steps with a railing? : A Little 6 Click Score: 16    End of Session   Activity Tolerance: Patient tolerated treatment  well Patient left: in chair;with call bell/phone within reach Nurse Communication: Mobility status PT Visit Diagnosis: Other abnormalities of gait and mobility (R26.89);Pain Pain - part of body: (back)     Time: 8270-7867 PT Time Calculation (min) (ACUTE ONLY): 12 min  Charges:  $Gait Training: 8-22 mins                     Sherie Don, [place], DPT  Acute Rehabilitation Services Pager [phone] Office [place] 07/31/2018, 12:36 PM

## 2018-07-31 NOTE — Progress Notes (Signed)
Neurosurgery Service Progress Note  Subjective: No acute events overnight  Objective: Vitals:   07/30/18 1648 07/30/18 1955 07/30/18 2355 07/31/18 0358  BP: 118/82 124/79 132/67 122/79  Pulse: 82 91 84 72  Resp: 18 18 18 18   Temp: 98.2 F (36.8 C) 98.3 F (36.8 C) 98.8 F (37.1 C) 98.9 F (37.2 C)  TempSrc: Oral Oral Oral Oral  SpO2: 96% 100% 100% 97%  Weight:      Height:       Temp (24hrs), Avg:98.7 F (37.1 C), Min:98.2 F (36.8 C), Max:99.5 F (37.5 C)  CBC Latest Ref Rng & Units 07/28/2018 07/26/2018 07/25/2018  WBC 4.0 - 10.5 K/uL - 7.5 10.0  Hemoglobin 13.0 - 17.0 g/dL 12.6(L) 12.4(L) 13.0  Hematocrit 39.0 - 52.0 % 37.0(L) 38.7(L) 40.8  Platelets 150 - 400 K/uL - 338 360   BMP Latest Ref Rng & Units 07/28/2018 07/26/2018 07/25/2018  Glucose 70 - 99 mg/dL 134(H) 116(H) 108(H)  BUN 8 - 23 mg/dL - 13 17  Creatinine 0.61 - 1.24 mg/dL - 1.04 1.21  Sodium 135 - 145 mmol/L 140 138 140  Potassium 3.5 - 5.1 mmol/L 3.1(L) 3.0(L) 3.5  Chloride 98 - 111 mmol/L - 101 106  CO2 22 - 32 mmol/L - 28 24  Calcium 8.9 - 10.3 mg/dL - 9.9 10.0    Intake/Output Summary (Last 24 hours) at 07/31/2018 0846 Last data filed at 07/31/2018 0800 Gross per 24 hour  Intake 480 ml  Output 1040 ml  Net -560 ml    Current Facility-Administered Medications:  .  0.9 %  sodium chloride infusion, , Intravenous, PRN, Judith Part, MD, Last Rate: 10 mL/hr at 07/29/18 0512 .  atenolol (TENORMIN) tablet 50 mg, 50 mg, Oral, Daily, 50 mg at 07/30/18 0912 **AND** chlorthalidone (HYGROTON) tablet 25 mg, 25 mg, Oral, Daily, Judith Part, MD, 25 mg at 07/30/18 0912 .  cloNIDine (CATAPRES) tablet 0.1 mg, 0.1 mg, Oral, Daily, Judith Part, MD, 0.1 mg at 07/30/18 0911 .  docusate sodium (COLACE) capsule 100 mg, 100 mg, Oral, BID, Judith Part, MD, 100 mg at 07/30/18 2157 .  DULoxetine (CYMBALTA) DR capsule 30 mg, 30 mg, Oral, Daily, Alegandro Macnaughton A, MD, 30 mg at 07/30/18  0912 .  heparin injection 5,000 Units, 5,000 Units, Subcutaneous, Q8H, Judith Part, MD, 5,000 Units at 07/31/18 [phone] .  HYDROmorphone (DILAUDID) injection 0.5 mg, 0.5 mg, Intravenous, Q3H PRN, Judith Part, MD, 0.5 mg at 07/30/18 2058 .  insulin aspart (novoLOG) injection 0-5 Units, 0-5 Units, Subcutaneous, QHS, Maui Britten A, MD .  insulin aspart (novoLOG) injection 0-9 Units, 0-9 Units, Subcutaneous, TID WC, Judith Part, MD, 1 Units at 07/31/18 [phone] .  metFORMIN (GLUCOPHAGE) tablet 250 mg, 250 mg, Oral, Q breakfast, Judith Part, MD, 250 mg at 07/31/18 [phone] .  methocarbamol (ROBAXIN) tablet 500 mg, 500 mg, Oral, Q8H PRN, Reinaldo Meeker, Meghan D, NP, 500 mg at 07/30/18 0619 .  ondansetron (ZOFRAN) tablet 4 mg, 4 mg, Oral, Q6H PRN **OR** ondansetron (ZOFRAN) injection 4 mg, 4 mg, Intravenous, Q6H PRN, Macaela Presas A, MD .  oxyCODONE (Oxy IR/ROXICODONE) immediate release tablet 10 mg, 10 mg, Oral, Q4H PRN, Judith Part, MD, 10 mg at 07/31/18 9381 .  oxyCODONE (Oxy IR/ROXICODONE) immediate release tablet 15 mg, 15 mg, Oral, Q4H PRN, Judith Part, MD, 15 mg at 07/30/18 2304 .  polyethylene glycol (MIRALAX / GLYCOLAX) packet 17 g, 17 g, Oral, Daily PRN, Emelda Brothers  A, MD .  simvastatin (ZOCOR) tablet 20 mg, 20 mg, Oral, QHS, Davan Nawabi, Joyice Faster, MD, 20 mg at 07/30/18 2157   Physical Exam: Strength 5/5 in BUE and RLE LLE diffusely 4/5 but limited distally by left ankle deformity SILT except for decreased sensation in LLE, normal sensation in great toe  Assessment & Plan: 67 y.o. 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 MRI T-spine w/ contrast: no mass. 10/21 s/p T11-12 laminectomies, recovering well. -discharge home today  Judith Part  07/31/18 8:46 AM

## 2018-07-31 NOTE — Discharge Instructions (Signed)
Discharge Instructions  No restriction in activities, slowly increase your activity back to normal.   Okay to shower on the day of discharge. Be gentle when cleaning your incision. Use regular soap and water. If that is uncomfortable, try using baby shampoo. Do not submerge the wound under water for 2 weeks after surgery. Keep your incision open to air, no need for a dressing. If the wound starts to itch or you have stitches and they feel sharp, rub some antibiotic ointment (like bacitracin or neosporin) on the wound.  Call the office at [phone] to schedule a post-op follow up visit with Dr. Zada Finders for 2 weeks after surgery. If you have any concerns or questions, please call the office.

## 2018-07-31 NOTE — Care Management Note (Signed)
Case Management Note  Patient Details  Name: Keith [name] MRN: [id number] Date of Birth: Jul 26, 1951  Subjective/Objective:    Pt s/p thoracic surgery. He is from home with spouse. Pt has walker at home.               Action/Plan: No f/u per PT/OT. Pts wife is able to provide intermittent supervision.  Recommendations for a tub seat. Pt states he is going to purchase one outside the hospital setting.  Wife to provide transport home.   Expected Discharge Date:  07/31/18               Expected Discharge Plan:  Home/Self Care  In-House Referral:     Discharge planning Services     Post Acute Care Choice:    Choice offered to:     DME Arranged:    DME Agency:     HH Arranged:    HH Agency:     Status of Service:  Completed, signed off  If discussed at [employer] of Stay Meetings, dates discussed:    Additional Comments:  Pollie Friar, RN 07/31/2018, 3:27 PM

## 2018-08-01 NOTE — Progress Notes (Signed)
Pt. Discharged to home. Went over the package with Pt and wife. Discussed PT/OT paper about no bending. Pt understood to take meds as prescribed and to not sell or share meds. Prescriptions were picked up at [place]. Follow-up appt. Noted. No questions or concerns.

## 2018-11-19 ENCOUNTER — Encounter: Payer: Self-pay | Admitting: Family Medicine

## 2018-11-19 ENCOUNTER — Ambulatory Visit (INDEPENDENT_AMBULATORY_CARE_PROVIDER_SITE_OTHER): Payer: BLUE CROSS/BLUE SHIELD | Admitting: Family Medicine

## 2018-11-19 VITALS — BP 122/76 | HR 73 | Temp 98.9°F | Resp 12 | Ht 73.0 in | Wt 274.0 lb

## 2018-11-19 DIAGNOSIS — I10 Essential (primary) hypertension: Secondary | ICD-10-CM

## 2018-11-19 DIAGNOSIS — E785 Hyperlipidemia, unspecified: Secondary | ICD-10-CM

## 2018-11-19 DIAGNOSIS — E1140 Type 2 diabetes mellitus with diabetic neuropathy, unspecified: Secondary | ICD-10-CM | POA: Diagnosis not present

## 2018-11-19 DIAGNOSIS — Z136 Encounter for screening for cardiovascular disorders: Secondary | ICD-10-CM

## 2018-11-19 DIAGNOSIS — E876 Hypokalemia: Secondary | ICD-10-CM

## 2018-11-19 DIAGNOSIS — E1169 Type 2 diabetes mellitus with other specified complication: Secondary | ICD-10-CM

## 2018-11-19 LAB — LIPID PANEL
Cholesterol: 166 mg/dL (ref 0–200)
HDL: 48.4 mg/dL (ref >39.00)
LDL Cholesterol: 102 mg/dL — ABNORMAL HIGH (ref 0–99)
NonHDL: 117.51
Total CHOL/HDL Ratio: 3
Triglycerides: 77 mg/dL (ref 0.0–149.0)
VLDL: 15.4 mg/dL (ref 0.0–40.0)

## 2018-11-19 LAB — HEMOGLOBIN A1C: Hgb A1c MFr Bld: 6.6 % — ABNORMAL HIGH (ref 4.6–6.5)

## 2018-11-19 LAB — BASIC METABOLIC PANEL
BUN: 27 mg/dL — ABNORMAL HIGH (ref 6–23)
CO2: 29 mEq/L (ref 19–32)
Calcium: 10.1 mg/dL (ref 8.4–10.5)
Chloride: 105 mEq/L (ref 96–112)
Creatinine, Ser: 1.42 mg/dL (ref 0.40–1.50)
GFR: 60.29 mL/min (ref >60.00)
Glucose, Bld: 98 mg/dL (ref 70–99)
Potassium: 3.9 mEq/L (ref 3.5–5.1)
Sodium: 141 mEq/L (ref 135–145)

## 2018-11-19 LAB — MICROALBUMIN / CREATININE URINE RATIO
Creatinine,U: 213.8 mg/dL
Microalb Creat Ratio: 3.5 mg/g (ref 0.0–30.0)
Microalb, Ur: 7.4 mg/dL — ABNORMAL HIGH (ref 0.0–1.9)

## 2018-11-19 NOTE — Progress Notes (Signed)
HPI:   Keith [name] is a 68 y.o. male, who is here today to establish care.  Former PCP: Dr Sherren Mocha Last preventive routine visit: 11/11/17.  Chronic medical problems: Left foot deformity, ED,chronic pain, lumbar spinal stenosis, DM 2, hyperlipidemia, and hypertension among some. Chronic back pain and "a touch of scoliosis." He follows with neurosurgeon and pain clinic. He also follows with podiatrist regularly.  HTN: He is on Tenoretic 50-25 mg daily and Clonidine 0.1 mg daily. Negative for headache, visual changes, chest pain, dyspnea, palpitation, claudication, focal weakness, or edema. He is not checking BP's at home.  Hypokalemia,he has not noted cramps.  Diabetes Mellitus II:  Dx 5+ years ago. Currently on Metformin 250 mg daily..  Checking BS's : Not checking. Hypoglycemia:Denies. Last eye exam within the past year.  He is tolerating medications well. He denies polydipsia, polyuria, or polyphagia. Slight intermittent plantar tingling sensation.   Lab Results  Component Value Date   CREATININE 1.04 07/26/2018   BUN 13 07/26/2018   NA 140 07/28/2018   K 3.1 (L) 07/28/2018   CL 101 07/26/2018   CO2 28 07/26/2018    Lab Results  Component Value Date   HGBA1C 6.6 (H) 06/03/2018   Lab Results  Component Value Date   MICROALBUR 5.9 (H) 11/11/2017   HLD: He takes Zocor 20 mg daily. He has tolerated medications well.   Component     Latest Ref Rng & Units 11/11/2017  Cholesterol     0 - 200 mg/dL 142  Triglycerides     0.0 - 149.0 mg/dL 106.0  HDL Cholesterol     >39.00 mg/dL 50.70  VLDL     0.0 - 40.0 mg/dL 21.2  LDL (calc)     0 - 99 mg/dL 70  Total CHOL/HDL Ratio      3  NonHDL      90.83     Review of Systems  Constitutional: Negative for activity change, appetite change, fatigue and fever.  HENT: Negative for mouth sores, nosebleeds and sore throat.   Eyes: Negative for redness and visual disturbance.  Respiratory: Negative  for cough, shortness of breath and wheezing.   Cardiovascular: Negative for chest pain, palpitations and leg swelling.  Gastrointestinal: Negative for abdominal pain, nausea and vomiting.  Genitourinary: Negative for decreased urine volume and hematuria.  Musculoskeletal: Positive for arthralgias, back pain and gait problem.  Skin: Negative for rash and wound.  Neurological: Negative for weakness and headaches.    Current Outpatient Medications on File Prior to Visit  Medication Sig Dispense Refill  . atenolol-chlorthalidone (TENORETIC) 50-25 MG tablet Take 1 tablet by mouth daily. 100 tablet 4  . cloNIDine (CATAPRES) 0.1 MG tablet TAKE 1 TABLET (0.1 MG TOTAL) BY MOUTH DAILY. (Patient taking differently: Take 0.1 mg by mouth daily. ) 100 tablet 4  . DULoxetine (CYMBALTA) 30 MG capsule Take 30 mg by mouth daily. Take 1 tablet once daily    . metFORMIN (GLUCOPHAGE) 500 MG tablet TAKE 1/2 TABLET BY MOUTH IN THE MORNING (Patient taking differently: Take 250 mg by mouth daily with breakfast. ) 100 tablet 2  . methocarbamol (ROBAXIN) 500 MG tablet Take 1 tablet (500 mg total) by mouth every 8 (eight) hours as needed for muscle spasms. 30 tablet 1  . Multiple Vitamin (MULITIVITAMIN WITH MINERALS) TABS Take 1 tablet by mouth daily.    . OXYCONTIN 10 MG 12 hr tablet     . sildenafil (REVATIO) 20 MG tablet  1-2 tablets 2-3 hours prior to sex 30 tablet 10  . simvastatin (ZOCOR) 20 MG tablet Take 1 tablet (20 mg total) by mouth at bedtime. 90 tablet 4   Current Facility-Administered Medications on File Prior to Visit  Medication Dose Route Frequency Provider Last Rate Last Dose  . 0.9 %  sodium chloride infusion  500 mL Intravenous Once Ladene Artist, MD         Past Medical History:  Diagnosis Date  . Allergy   . Chronic back pain   . Diabetes mellitus without complication ([place])   . Duodenal ulcer hemorrhage 08/29/2014  . ED (erectile dysfunction)   . Esophageal stricture 08/30/2014  .  Glucose intolerance (impaired glucose tolerance)   . Hiatal hernia 08/30/2014  . Hypertension   . Hypokalemia 04/11/2013  . 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 Obesity   . Osteoarthritis   . Scoliosis 2016  . Spinal stenosis of lumbar region   . Urinary tract infection 04/11/2013   Allergies  Allergen Reactions  . Lisinopril Other (See Comments)    Body Aches    Family History  Problem Relation Age of Onset  . Diabetes Mother   . Asthma Mother   . Hypertension Father   . Stroke Father   . Colon cancer Neg Hx     Social History   Socioeconomic History  . Marital status: Married    Spouse name: Not on file  . Number of children: Not on file  . Years of education: Not on file  . Highest education level: Not on file  Occupational History  . Not on file  Social Needs  . Financial resource strain: Not on file  . Food insecurity:    Worry: Not on file    Inability: Not on file  . Transportation needs:    Medical: Not on file    Non-medical: Not on file  Tobacco Use  . Smoking status: Former Smoker    Packs/day: 1.00    Years: 25.00    Pack years: 25.00  . Smokeless tobacco: Never Used  Substance and Sexual Activity  . Alcohol use: Not Currently    Comment: Christmas/4th of July  . Drug use: No  . Sexual activity: Yes    Birth control/protection: None  Lifestyle  . Physical activity:    Days per week: Not on file    Minutes per session: Not on file  . Stress: Not on file  Relationships  . Social connections:    Talks on phone: Not on file    Gets together: Not on file    Attends religious service: Not on file    Active member of club or organization: Not on file    Attends meetings of clubs or organizations: Not on file    Relationship status: Not on file  Other Topics Concern  . Not on file  Social History Narrative   Married.      Vitals:   11/19/18 1359  BP: 122/76  Pulse: 73  Resp: 12  Temp: 98.9 F (37.2 C)  SpO2: 99%    Body mass index is  36.15 kg/m.  Physical Exam  Nursing note and vitals reviewed. Constitutional: He is oriented to person, place, and time. He appears well-developed. No distress.  HENT:  Head: Normocephalic and atraumatic.  Mouth/Throat: Oropharynx is clear and moist and mucous membranes are normal.  Eyes: Pupils are equal, round, and reactive to light. Conjunctivae are normal.  Cardiovascular: Normal rate and regular rhythm.  No murmur heard. Pulses:      Dorsalis pedis pulses are 2+ on the right side and 2+ on the left side.  Respiratory: Effort normal and breath sounds normal. No respiratory distress.  GI: Soft. He exhibits no mass. There is no hepatomegaly. There is no abdominal tenderness.  Musculoskeletal:        General: No edema.     Left ankle: He exhibits deformity.     Comments: Lateral fixed deviation of ankle,pressure changes on medial aspect of left foot.  Lymphadenopathy:    He has no cervical adenopathy.  Neurological: He is alert and oriented to person, place, and time. He has normal strength. No cranial nerve deficit. Gait abnormal.  Unstable,antalgic,assited by a cane.   Skin: Skin is warm. No rash noted. No erythema.  Psychiatric: He has a normal mood and affect.  Well groomed, good eye contact.   Diabetic Foot Exam - Simple   Simple Foot Form Diabetic Foot exam was performed with the following findings:  Yes 11/19/2018  2:38 PM  Visual Inspection See comments:  Yes Sensation Testing Intact to touch and monofilament testing bilaterally:  Yes Pulse Check Posterior Tibialis and Dorsalis pulse intact bilaterally:  Yes Comments Calluses bilateral,L>R. Charcot foot,left.      ASSESSMENT AND PLAN:  Mr. Jakeim was seen today for transfer of care.  Orders Placed This Encounter  Procedures  . US AORTA LIMITED  . Basic metabolic panel  . Hemoglobin A1c  . Fructosamine  . Microalbumin / creatinine urine ratio  . Lipid panel   Lab Results  Component Value Date   CHOL  166 11/19/2018   HDL 48.40 11/19/2018   LDLCALC 102 (H) 11/19/2018   TRIG 77.0 11/19/2018   CHOLHDL 3 11/19/2018   Lab Results  Component Value Date   HGBA1C 6.6 (H) 11/19/2018   Lab Results  Component Value Date   CREATININE 1.42 11/19/2018   BUN 27 (H) 11/19/2018   NA 141 11/19/2018   K 3.9 11/19/2018   CL 105 11/19/2018   CO2 29 11/19/2018   Lab Results  Component Value Date   MICROALBUR 7.4 (H) 11/19/2018    Type 2 diabetes mellitus with diabetic neuropathy, unspecified ([place]) HgA1C has been at goal. A1c done today, results pending. For now no changes in metformin dose.  Regular exercise as tolerated and healthy diet with avoidance of added sugar food intake is an important part of treatment and recommended. Annual eye exam, periodic dental and foot care recommended. F/U in 5-6 months   Essential hypertension BP adequately controlled. No changes in Tenoretic 50-25 mg daily or clonidine 01 mg daily. Continue low-salt diet. He is due for eye exam. Follow-up in 5 to 6 months.  Hyperlipidemia associated with type 2 diabetes mellitus (HCC) Continue simvastatin 20 mg daily as well as low-fat diet. Further recommendation will be given according to lipid panel results.  Screening for AAA (abdominal aortic aneurysm) -     US AORTA LIMITED; Future  Hypokalemia Chlorthalidone could be causing/aggravating problem. If problem is persistent, we may need to consider stopping chlorthalidone, otherwise we will need K+ supplementation.    Return in about 6 months (around 05/20/2019) for cpe and f/u.    Ravin Bendall G. [name], MD  [facility]. [place] office.

## 2018-11-19 NOTE — Assessment & Plan Note (Signed)
BP adequately controlled. No changes in Tenoretic 50-25 mg daily or clonidine 01 mg daily. Continue low-salt diet. He is due for eye exam. Follow-up in 5 to 6 months.

## 2018-11-19 NOTE — Assessment & Plan Note (Signed)
Continue simvastatin 20 mg daily as well as low-fat diet. Further recommendation will be given according to lipid panel results.

## 2018-11-19 NOTE — Assessment & Plan Note (Signed)
HgA1C has been at goal. A1c done today, results pending. For now no changes in metformin dose.  Regular exercise as tolerated and healthy diet with avoidance of added sugar food intake is an important part of treatment and recommended. Annual eye exam, periodic dental and foot care recommended. F/U in 5-6 months

## 2018-11-19 NOTE — Patient Instructions (Signed)
A few things to remember from today's visit:   Type 2 diabetes mellitus with diabetic neuropathy, without long-term current use of insulin (HCC) - Plan: Hemoglobin A1c, Fructosamine, Microalbumin / creatinine urine ratio  Essential hypertension - Plan: Basic metabolic panel  Hyperlipidemia associated with type 2 diabetes mellitus ([place]) - Plan: Lipid panel  No changes today. As far as everything is stable I will to see you back in 6 months, before if something is very abnormal. It seems like you are due for your eye exam.  Please be sure medication list is accurate. If a new problem present, please set up appointment sooner than planned today.

## 2018-11-22 LAB — FRUCTOSAMINE: Fructosamine: 275 umol/L (ref 205–285)

## 2018-11-24 ENCOUNTER — Other Ambulatory Visit: Payer: Self-pay | Admitting: *Deleted

## 2018-11-24 MED ORDER — SIMVASTATIN 40 MG PO TABS: 40.0000 mg | ORAL_TABLET | Freq: Every day | ORAL | 3 refills | Status: DC

## 2018-11-27 ENCOUNTER — Other Ambulatory Visit: Payer: Self-pay | Admitting: Family Medicine

## 2018-11-27 ENCOUNTER — Ambulatory Visit
Admission: RE | Admit: 2018-11-27 | Discharge: 2018-11-27 | Disposition: A | Discharge status: Home / Self Care | Payer: BLUE CROSS/BLUE SHIELD | Source: Ambulatory Visit | Attending: Family Medicine | Admitting: Family Medicine

## 2018-11-27 DIAGNOSIS — Z136 Encounter for screening for cardiovascular disorders: Secondary | ICD-10-CM

## 2018-12-15 ENCOUNTER — Other Ambulatory Visit: Payer: Self-pay | Admitting: Family Medicine

## 2018-12-17 ENCOUNTER — Telehealth: Payer: Self-pay | Admitting: *Deleted

## 2018-12-17 ENCOUNTER — Other Ambulatory Visit: Payer: Self-pay | Admitting: *Deleted

## 2018-12-17 MED ORDER — ATENOLOL-CHLORTHALIDONE 50-25 MG PO TABS: 1.0000 | ORAL_TABLET | Freq: Every day | ORAL | 2 refills | Status: DC

## 2018-12-17 MED ORDER — CLONIDINE HCL 0.1 MG PO TABS: 0.1000 mg | ORAL_TABLET | Freq: Every day | ORAL | 2 refills | Status: DC

## 2018-12-17 NOTE — Telephone Encounter (Signed)
Prior auth for Sildenafil Citrate 20mg  sent to [email or web address] [id number].

## 2018-12-19 NOTE — Telephone Encounter (Signed)
Fax received from [facility] stating the request was denied and this was given to Dr Doug Sou asst.

## 2018-12-23 NOTE — Telephone Encounter (Signed)
FYI given to Dr. [name] for review.

## 2019-01-12 ENCOUNTER — Other Ambulatory Visit: Payer: Self-pay

## 2019-01-12 ENCOUNTER — Other Ambulatory Visit: Payer: Self-pay | Admitting: Family Medicine

## 2019-01-12 ENCOUNTER — Other Ambulatory Visit (INDEPENDENT_AMBULATORY_CARE_PROVIDER_SITE_OTHER): Payer: BLUE CROSS/BLUE SHIELD

## 2019-01-12 ENCOUNTER — Ambulatory Visit (INDEPENDENT_AMBULATORY_CARE_PROVIDER_SITE_OTHER): Payer: BLUE CROSS/BLUE SHIELD | Admitting: Family Medicine

## 2019-01-12 DIAGNOSIS — R8290 Unspecified abnormal findings in urine: Secondary | ICD-10-CM | POA: Diagnosis not present

## 2019-01-12 DIAGNOSIS — R300 Dysuria: Secondary | ICD-10-CM

## 2019-01-12 LAB — POCT URINALYSIS DIPSTICK
Bilirubin, UA: NEGATIVE
Glucose, UA: NEGATIVE
Ketones, UA: NEGATIVE
Protein, UA: POSITIVE — AB
Spec Grav, UA: 1.015 (ref 1.010–1.025)
Urobilinogen, UA: 0.2 E.U./dL
pH, UA: 7 (ref 5.0–8.0)

## 2019-01-12 MED ORDER — CIPROFLOXACIN HCL 500 MG PO TABS: 500.0000 mg | ORAL_TABLET | Freq: Two times a day (BID) | ORAL | 0 refills | Status: DC

## 2019-01-12 NOTE — Progress Notes (Signed)
Patient ID: Keith [name], male   DOB: [birth year], 68 y.o.   MRN: [id number]  Virtual Visit via Video Note  I connected with Keith [name] on 01/12/19 at 11:30 AM EDT by a video enabled telemedicine application and verified that I am speaking with the correct person using two identifiers. We attempted video conference but patient had some technical issues and we converted this to audio visit  Location patient: home Location provider:work or home office Persons participating in the virtual visit: patient, provider  I discussed the limitations of evaluation and management by telemedicine and the availability of in person appointments. The patient expressed understanding and agreed to proceed.   HPI: Patient had called this morning with about 4 to 5-day history of some increased odor and occasional burning with urination.  He had some low-grade nausea but no vomiting.  No abdominal pain.  No flank pain.  No fever.  No gross hematuria.  Past history of recurrent UTI.  Most recent in August and he grew out E. coli and Klebsiella.  He has type 2 diabetes which has been well controlled.  Last A1c 6.6%.  Generally staying well-hydrated.  Has had some nonspecific malaise.  No confusion.   ROS: See pertinent positives and negatives per HPI.  Past Medical History:  Diagnosis Date  . Allergy   . Chronic back pain   . Diabetes mellitus without complication ([place])   . Duodenal ulcer hemorrhage 08/29/2014  . ED (erectile dysfunction)   . Esophageal stricture 08/30/2014  . Glucose intolerance (impaired glucose tolerance)   . Hiatal hernia 08/30/2014  . Hypertension   . Hypokalemia 04/11/2013  . 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 Obesity   . Osteoarthritis   . Scoliosis 2016  . Spinal stenosis of lumbar region   . Urinary tract infection 04/11/2013    Past Surgical History:  Procedure Laterality Date  . COLONOSCOPY  2008  . ESOPHAGOGASTRODUODENOSCOPY N/A 08/29/2014   Procedure:  ESOPHAGOGASTRODUODENOSCOPY (EGD);  Surgeon: Lafayette Dragon, MD;  Location: [facility];  Service: Endoscopy;  Laterality: N/A;  . LAMINECTOMY N/A 07/28/2018   Procedure: THORACIC ELEVEN- THORACIC TWELVE POSTERIOR DECOMPRESSION LAMINECTOMY;  Surgeon: Judith Part, MD;  Location: [facility];  Service: Neurosurgery;  Laterality: N/A;  . LUMBAR LAMINECTOMY    . NASAL POLYP SURGERY    . TONSILLECTOMY      Family History  Problem Relation Age of Onset  . Diabetes Mother   . Asthma Mother   . Hypertension Father   . Stroke Father   . Colon cancer Neg Hx     SOCIAL HX: Non-smoker   Current Outpatient Medications:  .  atenolol-chlorthalidone (TENORETIC) 50-25 MG tablet, Take 1 tablet by mouth daily., Disp: 90 tablet, Rfl: 2 .  cloNIDine (CATAPRES) 0.1 MG tablet, Take 1 tablet (0.1 mg total) by mouth daily., Disp: 90 tablet, Rfl: 2 .  DULoxetine (CYMBALTA) 30 MG capsule, Take 30 mg by mouth daily. Take 1 tablet once daily, Disp: , Rfl:  .  metFORMIN (GLUCOPHAGE) 500 MG tablet, TAKE 1/2 TABLET BY MOUTH IN THE MORNING (Patient taking differently: Take 250 mg by mouth daily with breakfast. ), Disp: 100 tablet, Rfl: 2 .  methocarbamol (ROBAXIN) 500 MG tablet, Take 1 tablet (500 mg total) by mouth every 8 (eight) hours as needed for muscle spasms., Disp: 30 tablet, Rfl: 1 .  Multiple Vitamin (MULITIVITAMIN WITH MINERALS) TABS, Take 1 tablet by mouth daily., Disp: , Rfl:  .  OXYCONTIN 10 MG 12 hr tablet, ,  Disp: , Rfl:  .  sildenafil (REVATIO) 20 MG tablet, 1-2 TABLETS 2-3 HOURS PRIOR TO SEX, Disp: 30 tablet, Rfl: 3 .  simvastatin (ZOCOR) 40 MG tablet, Take 1 tablet (40 mg total) by mouth daily., Disp: 30 tablet, Rfl: 3 .  ciprofloxacin (CIPRO) 500 MG tablet, Take 1 tablet (500 mg total) by mouth 2 (two) times daily., Disp: 14 tablet, Rfl: 0  Current Facility-Administered Medications:  .  0.9 %  sodium chloride infusion, 500 mL, Intravenous, Once, Ladene Artist, MD  EXAM:  VITALS per  patient if applicable:  GENERAL: alert, oriented, appears well and in no acute distress  HEENT: atraumatic, conjunttiva clear, no obvious abnormalities on inspection of external nose and ears  NECK: normal movements of the head and neck  LUNGS: on inspection no signs of respiratory distress, breathing rate appears normal, no obvious gross SOB, gasping or wheezing  CV: no obvious cyanosis  MS: moves all visible extremities without noticeable abnormality  PSYCH/NEURO: pleasant and cooperative, no obvious depression or anxiety, speech and thought processing grossly intact  ASSESSMENT AND PLAN:  Discussed the following assessment and plan:  Dysuria.  Urine dipstick that patient brought in this morning highly suggest recurrent infection with 1+ blood, positive nitrites, and positive leukocytes.  -Drink plenty of water and stay well-hydrated -Urine culture sent -Start Cipro 500 mg twice daily for 7 days pending culture results -Follow-up immediately for any vomiting, confusion, increased weakness, or other concerns     I discussed the assessment and treatment plan with the patient. The patient was provided an opportunity to ask questions and all were answered. The patient agreed with the plan and demonstrated an understanding of the instructions.   The patient was advised to call back or seek an in-person evaluation if the symptoms worsen or if the condition fails to improve as anticipated.  Carolann Littler, MD

## 2019-01-14 LAB — URINE CULTURE
MICRO NUMBER:: 377592
SPECIMEN QUALITY:: ADEQUATE

## 2019-01-19 ENCOUNTER — Emergency Department (HOSPITAL_COMMUNITY)
Admission: EM | Admit: 2019-01-19 | Discharge: 2019-01-19 | Disposition: A | Discharge status: Home / Self Care | Payer: BLUE CROSS/BLUE SHIELD | Attending: Emergency Medicine | Admitting: Emergency Medicine

## 2019-01-19 ENCOUNTER — Encounter (HOSPITAL_COMMUNITY): Payer: Self-pay | Admitting: Emergency Medicine

## 2019-01-19 ENCOUNTER — Other Ambulatory Visit: Payer: Self-pay

## 2019-01-19 DIAGNOSIS — Z79899 Other long term (current) drug therapy: Secondary | ICD-10-CM | POA: Diagnosis not present

## 2019-01-19 DIAGNOSIS — I10 Essential (primary) hypertension: Secondary | ICD-10-CM | POA: Diagnosis not present

## 2019-01-19 DIAGNOSIS — E860 Dehydration: Secondary | ICD-10-CM | POA: Diagnosis not present

## 2019-01-19 DIAGNOSIS — Z87891 Personal history of nicotine dependence: Secondary | ICD-10-CM | POA: Insufficient documentation

## 2019-01-19 DIAGNOSIS — E119 Type 2 diabetes mellitus without complications: Secondary | ICD-10-CM | POA: Diagnosis not present

## 2019-01-19 DIAGNOSIS — R531 Weakness: Secondary | ICD-10-CM | POA: Diagnosis present

## 2019-01-19 DIAGNOSIS — Z7984 Long term (current) use of oral hypoglycemic drugs: Secondary | ICD-10-CM | POA: Diagnosis not present

## 2019-01-19 LAB — COMPREHENSIVE METABOLIC PANEL
ALT: 37 U/L (ref 0–44)
AST: 45 U/L — ABNORMAL HIGH (ref 15–41)
Albumin: 3.6 g/dL (ref 3.5–5.0)
Alkaline Phosphatase: 164 U/L — ABNORMAL HIGH (ref 38–126)
Anion gap: 18 — ABNORMAL HIGH (ref 5–15)
BUN: 31 mg/dL — ABNORMAL HIGH (ref 8–23)
CO2: 25 mmol/L (ref 22–32)
Calcium: 9.8 mg/dL (ref 8.9–10.3)
Chloride: 95 mmol/L — ABNORMAL LOW (ref 98–111)
Creatinine, Ser: 1.71 mg/dL — ABNORMAL HIGH (ref 0.61–1.24)
GFR calc Af Amer: 47 mL/min — ABNORMAL LOW (ref >60)
GFR calc non Af Amer: 41 mL/min — ABNORMAL LOW (ref >60)
Glucose, Bld: 161 mg/dL — ABNORMAL HIGH (ref 70–99)
Potassium: 3 mmol/L — ABNORMAL LOW (ref 3.5–5.1)
Sodium: 138 mmol/L (ref 135–145)
Total Bilirubin: 0.6 mg/dL (ref 0.3–1.2)
Total Protein: 8.8 g/dL — ABNORMAL HIGH (ref 6.5–8.1)

## 2019-01-19 LAB — CBC WITH DIFFERENTIAL/PLATELET
Abs Immature Granulocytes: 0.03 10*3/uL (ref 0.00–0.07)
Basophils Absolute: 0 10*3/uL (ref 0.0–0.1)
Basophils Relative: 0 %
Eosinophils Absolute: 0 10*3/uL (ref 0.0–0.5)
Eosinophils Relative: 0 %
HCT: 36.6 % — ABNORMAL LOW (ref 39.0–52.0)
Hemoglobin: 11.8 g/dL — ABNORMAL LOW (ref 13.0–17.0)
Immature Granulocytes: 0 %
Lymphocytes Relative: 9 %
Lymphs Abs: 0.6 10*3/uL — ABNORMAL LOW (ref 0.7–4.0)
MCH: 29.4 pg (ref 26.0–34.0)
MCHC: 32.2 g/dL (ref 30.0–36.0)
MCV: 91 fL (ref 80.0–100.0)
Monocytes Absolute: 0.6 10*3/uL (ref 0.1–1.0)
Monocytes Relative: 9 %
Neutro Abs: 5.7 10*3/uL (ref 1.7–7.7)
Neutrophils Relative %: 82 %
Platelets: 243 10*3/uL (ref 150–400)
RBC: 4.02 MIL/uL — ABNORMAL LOW (ref 4.22–5.81)
RDW: 14.4 % (ref 11.5–15.5)
WBC: 7 10*3/uL (ref 4.0–10.5)
nRBC: 0 % (ref 0.0–0.2)

## 2019-01-19 LAB — URINALYSIS, ROUTINE W REFLEX MICROSCOPIC
Bilirubin Urine: NEGATIVE
Glucose, UA: NEGATIVE mg/dL
Ketones, ur: NEGATIVE mg/dL
Leukocytes,Ua: NEGATIVE
Nitrite: NEGATIVE
Protein, ur: 100 mg/dL — AB
Specific Gravity, Urine: 1.02 (ref 1.005–1.030)
pH: 5 (ref 5.0–8.0)

## 2019-01-19 MED ORDER — SODIUM CHLORIDE 0.9 % IV BOLUS
500.0000 mL | Freq: Once | INTRAVENOUS | Status: AC
  Administered 2019-01-19: 500 mL via INTRAVENOUS

## 2019-01-19 MED ORDER — SODIUM CHLORIDE 0.9 % IV BOLUS
1000.0000 mL | Freq: Once | INTRAVENOUS | Status: AC
  Administered 2019-01-19: 1000 mL via INTRAVENOUS

## 2019-01-19 MED ORDER — METOCLOPRAMIDE HCL 5 MG/ML IJ SOLN
10.0000 mg | Freq: Once | INTRAMUSCULAR | Status: AC
  Administered 2019-01-19: 10 mg via INTRAVENOUS
  Filled 2019-01-19: qty 2

## 2019-01-19 MED ORDER — ONDANSETRON HCL 4 MG/2ML IJ SOLN
4.0000 mg | Freq: Once | INTRAMUSCULAR | Status: AC
  Administered 2019-01-19: 4 mg via INTRAVENOUS
  Filled 2019-01-19: qty 2

## 2019-01-19 NOTE — ED Notes (Signed)
Pt aware we need a urine specimen. 

## 2019-01-19 NOTE — Discharge Instructions (Signed)
Return here as needed.  Increase your fluid intake.  Rest as much as possible.  Call your doctor for a recheck.

## 2019-01-19 NOTE — ED Notes (Signed)
Pt also reports decreased appetite for the past week.

## 2019-01-19 NOTE — ED Notes (Signed)
Patient verbalizes understanding of discharge instructions. Opportunity for questioning and answers were provided. Armband removed by staff, pt discharged from ED.  

## 2019-01-19 NOTE — ED Triage Notes (Signed)
Pt here with c/o nausea and weakness.  Pt states he was dx with UTI.  PCP called pt 2 days later stating they also saw E-Coli in culture.  Pt was prescribed Ciprofloxacin 500mg  twice daily.  Pt did complete regimen at which time he began having symptoms of nausea, weakness, and loss of appetite.

## 2019-01-20 LAB — URINE CULTURE: Culture: NO GROWTH

## 2019-01-20 NOTE — ED Provider Notes (Signed)
[facility] EMERGENCY DEPARTMENT Provider Note   CSN: [id number] Arrival date & time: 01/19/19  4854    History   Chief Complaint No chief complaint on file.   HPI Keith [name] is a 68 y.o. male.     HPI Patient presents to the emergency department with generalized weakness and fatigue along with recently diagnosed urinary tract infection.  The patient states has not been eating and drinking as well as normal over the last week.  The patient states he was diagnosed with a urinary tract infection and took a seven-day course of ciprofloxacin.  She also also had fairly extreme nausea during that time.  Patient states that he did take all of the medication.  Patient states that he has not been feeling well over that timeframe.  The patient states that he has not taken any other medications.  The patient denies chest pain, shortness of breath, headache,blurred vision, neck pain, fever, cough, weakness, numbness, dizziness, anorexia, edema, abdominal pain,vomiting, diarrhea, rash, back pain, dysuria, hematemesis, bloody stool, near syncope, or syncope. Past Medical History:  Diagnosis Date  . Allergy   . Chronic back pain   . Diabetes mellitus without complication ([place])   . Duodenal ulcer hemorrhage 08/29/2014  . ED (erectile dysfunction)   . Esophageal stricture 08/30/2014  . Glucose intolerance (impaired glucose tolerance)   . Hiatal hernia 08/30/2014  . Hypertension   . Hypokalemia 04/11/2013  . 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 Obesity   . Osteoarthritis   . Scoliosis 2016  . Spinal stenosis of lumbar region   . Urinary tract infection 04/11/2013    Patient Active Problem List   Diagnosis Date Noted  . Thoracic myelopathy 07/25/2018  . Hyperlipidemia associated with type 2 diabetes mellitus ([place]) 11/14/2016  . Type 2 diabetes mellitus with diabetic neuropathy, unspecified ([place]) 11/14/2016  . Routine general medical examination at a health care facility  11/14/2015  . Hiatal hernia 08/30/2014  . Allergic rhinitis 04/10/2013  . Spinal stenosis of lumbar region 11/06/2012  . Allergic rhinitis due to pollen 01/15/2011  . OSTEOARTHRITIS 11/02/2008  . HIP PAIN, LEFT 02/09/2008  . Obesity 11/07/2007  . ERECTILE DYSFUNCTION, MILD 11/07/2007  . Essential hypertension 11/07/2007    Past Surgical History:  Procedure Laterality Date  . COLONOSCOPY  2008  . ESOPHAGOGASTRODUODENOSCOPY N/A 08/29/2014   Procedure: ESOPHAGOGASTRODUODENOSCOPY (EGD);  Surgeon: Lafayette Dragon, MD;  Location: [facility];  Service: Endoscopy;  Laterality: N/A;  . LAMINECTOMY N/A 07/28/2018   Procedure: THORACIC ELEVEN- THORACIC TWELVE POSTERIOR DECOMPRESSION LAMINECTOMY;  Surgeon: Judith Part, MD;  Location: [facility];  Service: Neurosurgery;  Laterality: N/A;  . LUMBAR LAMINECTOMY    . NASAL POLYP SURGERY    . TONSILLECTOMY          Home Medications    Prior to Admission medications   Medication Sig Start Date End Date Taking? Authorizing Provider  atenolol-chlorthalidone (TENORETIC) 50-25 MG tablet Take 1 tablet by mouth daily. 12/17/18  Yes [name], [name], MD  cloNIDine (CATAPRES) 0.1 MG tablet Take 1 tablet (0.1 mg total) by mouth daily. 12/17/18  Yes [name], [name], MD  DULoxetine (CYMBALTA) 30 MG capsule Take 30 mg by mouth daily.  01/01/17  Yes Marlaine Hind, MD  metFORMIN (GLUCOPHAGE) 500 MG tablet TAKE 1/2 TABLET BY MOUTH IN THE MORNING Patient taking differently: Take 250 mg by mouth daily with breakfast.  02/10/18  Yes Dorena Cookey, MD  Multiple Vitamin (MULITIVITAMIN WITH MINERALS) TABS Take 1  tablet by mouth daily.   Yes [provider]  OXYCONTIN 10 MG 12 hr tablet Take 10 mg by mouth 3 (three) times daily.  10/21/18  Yes [provider]  sildenafil (REVATIO) 20 MG tablet 1-2 TABLETS 2-3 HOURS PRIOR TO SEX Patient taking differently: Take 20-40 mg by mouth See admin instructions. Take one to two tablets by mouth 2-3 hours prior  to sex. 12/16/18  Yes [name], [name], MD  simvastatin (ZOCOR) 40 MG tablet Take 1 tablet (40 mg total) by mouth daily. 11/24/18  Yes [name], [name], MD  methocarbamol (ROBAXIN) 500 MG tablet Take 1 tablet (500 mg total) by mouth every 8 (eight) hours as needed for muscle spasms. Patient not taking: Reported on 01/19/2019 07/31/18   Judith Part, MD    Family History Family History  Problem Relation Age of Onset  . Diabetes Mother   . Asthma Mother   . Hypertension Father   . Stroke Father   . Colon cancer Neg Hx     Social History Social History   Tobacco Use  . Smoking status: Former Smoker    Packs/day: 1.00    Years: 25.00    Pack years: 25.00  . Smokeless tobacco: Never Used  Substance Use Topics  . Alcohol use: Not Currently    Comment: Christmas/4th of July  . Drug use: No     Allergies   Lisinopril   Review of Systems Review of Systems All other systems negative except as documented in the HPI. All pertinent positives and negatives as reviewed in the HPI.  Physical Exam Updated Vital Signs BP 110/70   Pulse 69   Temp 97.8 F (36.6 C)   Resp 18   Ht 6' (1.829 m)   Wt 121.1 kg   SpO2 99%   BMI 36.21 kg/m   Physical Exam Vitals signs and nursing note reviewed.  Constitutional:      General: He is not in acute distress.    Appearance: He is well-developed.  HENT:     Head: Normocephalic and atraumatic.  Eyes:     Pupils: Pupils are equal, round, and reactive to light.  Neck:     Musculoskeletal: Normal range of motion and neck supple.  Cardiovascular:     Rate and Rhythm: Normal rate and regular rhythm.     Heart sounds: Normal heart sounds. No murmur. No friction rub. No gallop.   Pulmonary:     Effort: Pulmonary effort is normal. No respiratory distress.     Breath sounds: Normal breath sounds. No wheezing.  Abdominal:     General: Bowel sounds are normal. There is no distension.     Palpations: Abdomen is soft. There is no mass.      Tenderness: There is no abdominal tenderness. There is no guarding or rebound.     Hernia: No hernia is present.  Skin:    General: Skin is warm and dry.     Capillary Refill: Capillary refill takes less than 2 seconds.     Findings: No erythema or rash.  Neurological:     Mental Status: He is alert and oriented to person, place, and time.     Motor: No abnormal muscle tone.     Coordination: Coordination normal.  Psychiatric:        Behavior: Behavior normal.      ED Treatments / Results  Labs (all labs ordered are listed, but only abnormal results are displayed) Labs Reviewed  COMPREHENSIVE METABOLIC  PANEL - Abnormal; Notable for the following components:      Result Value   Potassium 3.0 (*)    Chloride 95 (*)    Glucose, Bld 161 (*)    BUN 31 (*)    Creatinine, Ser 1.71 (*)    Total Protein 8.8 (*)    AST 45 (*)    Alkaline Phosphatase 164 (*)    GFR calc non Af Amer 41 (*)    GFR calc Af Amer 47 (*)    Anion gap 18 (*)    All other components within normal limits  CBC WITH DIFFERENTIAL/PLATELET - Abnormal; Notable for the following components:   RBC 4.02 (*)    Hemoglobin 11.8 (*)    HCT 36.6 (*)    Lymphs Abs 0.6 (*)    All other components within normal limits  URINALYSIS, ROUTINE W REFLEX MICROSCOPIC - Abnormal; Notable for the following components:   APPearance HAZY (*)    Hgb urine dipstick MODERATE (*)    Protein, ur 100 (*)    Bacteria, UA RARE (*)    All other components within normal limits  URINE CULTURE    EKG None  Radiology No results found.  Procedures Procedures (including critical care time)  Medications Ordered in ED Medications  sodium chloride 0.9 % bolus 1,000 mL (0 mLs Intravenous Stopped 01/19/19 1326)  ondansetron (ZOFRAN) injection 4 mg (4 mg Intravenous Given 01/19/19 1106)  sodium chloride 0.9 % bolus 500 mL (0 mLs Intravenous Stopped 01/19/19 1459)  metoCLOPramide (REGLAN) injection 10 mg (10 mg Intravenous Given 01/19/19  1459)     Initial Impression / Assessment and Plan / ED Course  I have reviewed the triage vital signs and the nursing notes.  Pertinent labs & imaging results that were available during my care of the patient were reviewed by me and considered in my medical decision making (see chart for details).        Is dehydrated and was given fluids here in the emergency department.  Patient is advised him to follow-up with his doctor.  The patient is able to tolerate oral fluids without difficulty.  Advised the patient return for any worsening in his condition. Final Clinical Impressions(s) / ED Diagnoses   Final diagnoses:  Dehydration    ED Discharge Orders    None       Dalia Heading, PA-C 01/20/19 [place], [place], MD 01/23/19 1723

## 2019-02-11 ENCOUNTER — Other Ambulatory Visit: Payer: Self-pay | Admitting: *Deleted

## 2019-02-11 MED ORDER — SIMVASTATIN 40 MG PO TABS: 40.0000 mg | ORAL_TABLET | Freq: Every day | ORAL | 3 refills | Status: DC

## 2019-06-21 ENCOUNTER — Other Ambulatory Visit: Payer: Self-pay | Admitting: Family Medicine

## 2019-06-30 ENCOUNTER — Other Ambulatory Visit: Payer: Self-pay | Admitting: *Deleted

## 2019-06-30 MED ORDER — METFORMIN HCL 500 MG PO TABS: 250.0000 mg | ORAL_TABLET | Freq: Every day | ORAL | 2 refills | Status: DC

## 2019-10-11 ENCOUNTER — Other Ambulatory Visit: Payer: Self-pay | Admitting: Family Medicine

## 2019-12-07 ENCOUNTER — Other Ambulatory Visit: Payer: Self-pay | Admitting: Family Medicine

## 2019-12-27 ENCOUNTER — Other Ambulatory Visit: Payer: Self-pay | Admitting: Family Medicine

## 2020-02-07 ENCOUNTER — Other Ambulatory Visit: Payer: Self-pay | Admitting: Family Medicine

## 2020-03-21 ENCOUNTER — Other Ambulatory Visit: Payer: Self-pay | Admitting: Family Medicine

## 2020-03-29 ENCOUNTER — Other Ambulatory Visit: Payer: Self-pay

## 2020-03-30 ENCOUNTER — Encounter: Payer: Self-pay | Admitting: Family Medicine

## 2020-03-30 ENCOUNTER — Ambulatory Visit (INDEPENDENT_AMBULATORY_CARE_PROVIDER_SITE_OTHER): Payer: Medicare Other | Admitting: Family Medicine

## 2020-03-30 VITALS — BP 130/88 | HR 95 | Temp 97.7°F | Resp 16 | Ht 72.0 in | Wt 270.4 lb

## 2020-03-30 DIAGNOSIS — I10 Essential (primary) hypertension: Secondary | ICD-10-CM

## 2020-03-30 DIAGNOSIS — Z23 Encounter for immunization: Secondary | ICD-10-CM | POA: Diagnosis not present

## 2020-03-30 DIAGNOSIS — G9520 Unspecified cord compression: Secondary | ICD-10-CM

## 2020-03-30 DIAGNOSIS — E785 Hyperlipidemia, unspecified: Secondary | ICD-10-CM

## 2020-03-30 DIAGNOSIS — E1169 Type 2 diabetes mellitus with other specified complication: Secondary | ICD-10-CM | POA: Diagnosis not present

## 2020-03-30 DIAGNOSIS — E1140 Type 2 diabetes mellitus with diabetic neuropathy, unspecified: Secondary | ICD-10-CM

## 2020-03-30 MED ORDER — METFORMIN HCL 500 MG PO TABS: 250.0000 mg | ORAL_TABLET | Freq: Every day | ORAL | 2 refills | Status: DC

## 2020-03-30 MED ORDER — CLONIDINE HCL 0.1 MG PO TABS: 0.1000 mg | ORAL_TABLET | Freq: Every day | ORAL | 2 refills | Status: DC

## 2020-03-30 MED ORDER — ATENOLOL-CHLORTHALIDONE 50-25 MG PO TABS: 1.0000 | ORAL_TABLET | Freq: Every day | ORAL | 2 refills | Status: DC

## 2020-03-30 MED ORDER — SIMVASTATIN 40 MG PO TABS: 40.0000 mg | ORAL_TABLET | Freq: Every day | ORAL | 2 refills | Status: DC

## 2020-03-30 NOTE — Assessment & Plan Note (Signed)
We discussed benefits of statin medications. Recommend resuming simvastatin 40 mg daily. Continue low-fat diet.

## 2020-03-30 NOTE — Patient Instructions (Addendum)
A few things to remember from today's visit:   Type 2 diabetes mellitus with diabetic neuropathy, without long-term current use of insulin ([place]) - Plan: Comprehensive metabolic panel, Hemoglobin A1c, Fructosamine, Microalbumin / creatinine urine ratio  Essential hypertension - Plan: cloNIDine (CATAPRES) 0.1 MG tablet, Comprehensive metabolic panel, atenolol-chlorthalidone (TENORETIC) 50-25 MG tablet  Resume Simvastatin. No changes in rest of your meds. We will plan on doing medicare visit next visit.  If you need refills please call your pharmacy. Do not use My Chart to request refills or for acute issues that need immediate attention.    Please be sure medication list is accurate. If a new problem present, please set up appointment sooner than planned today.

## 2020-03-30 NOTE — Assessment & Plan Note (Signed)
With associated DM 2, hypertension, hyperlipidemia, and back OA. We discussed benefits of wt loss as well as adverse effects of obesity. Consistency with healthy diet and physical activity recommended.

## 2020-03-30 NOTE — Assessment & Plan Note (Signed)
BP adequately controlled. No changes in current management. Continue monitoring BP at home and low-salt diet. Eye exam is current.

## 2020-03-30 NOTE — Progress Notes (Signed)
HPI: [name] is a 69 y.o. male, who is here today for chronic disease management.  He was last seen on 11/19/18. Since his last visit he underwent back surgery, still doing PT.  DM II:Dx'ed 6+ years ago. Negative for polydipsia,polyuria, or polyphagia. He is on Metformin 250 mg bid. Tolerating medication well. BS's have been "good", low 110's.  Lab Results  Component Value Date   HGBA1C 6.6 (H) 11/19/2018   Lab Results  Component Value Date   MICROALBUR 7.4 (H) 11/19/2018   MICROALBUR 5.9 (H) 11/11/2017    HTN: He is on Atenolol-Chorthalidone 50-25 mg daily. BP readings at home: 120-130/80-90. Denies severe/frequent headache, visual changes, chest pain, dyspnea, palpitation,focal weakness, or unusual edema.  Lab Results  Component Value Date   CREATININE 1.71 (H) 01/19/2019   BUN 31 (H) 01/19/2019   NA 138 01/19/2019   K 3.0 (L) 01/19/2019   CL 95 (L) 01/19/2019   CO2 25 01/19/2019   HLD: He is on Simvastatin 40 mg daily. Tolerating medication well.  Lab Results  Component Value Date   CHOL 166 11/19/2018   HDL 48.40 11/19/2018   LDLCALC 102 (H) 11/19/2018   TRIG 77.0 11/19/2018   CHOLHDL 3 11/19/2018   He is not exercising regularly, still having PT for lower back pain. He uses a can for assistance. He has not been consistent with following a healthful diet.  Review of Systems  Constitutional: Negative for activity change, appetite change, fatigue and fever.  HENT: Negative for nosebleeds and sore throat.   Eyes: Negative for redness and visual disturbance.  Respiratory: Negative for cough and wheezing.   Gastrointestinal: Negative for abdominal pain, nausea and vomiting.  Genitourinary: Negative for decreased urine volume and hematuria.  Musculoskeletal: Positive for back pain and gait problem.  Neurological: Negative for syncope, facial asymmetry and weakness.  Psychiatric/Behavioral: Negative for confusion.  Rest of ROS, see  pertinent positives sand negatives in HPI  Current Outpatient Medications on File Prior to Visit  Medication Sig Dispense Refill  . Multiple Vitamin (MULITIVITAMIN WITH MINERALS) TABS Take 1 tablet by mouth daily.    . OXYCONTIN 10 MG 12 hr tablet Take 10 mg by mouth 3 (three) times daily.     . sildenafil (REVATIO) 20 MG tablet TAKE 1-2 TABLETS 2-3 HOURS PRIOR TO SEX 30 tablet 0   Current Facility-Administered Medications on File Prior to Visit  Medication Dose Route Frequency Provider Last Rate Last Admin  . 0.9 %  sodium chloride infusion  500 mL Intravenous Once Keith Artist, MD       Past Medical History:  Diagnosis Date  . Allergy   . Chronic back pain   . Diabetes mellitus without complication ([place])   . Duodenal ulcer hemorrhage 08/29/2014  . ED (erectile dysfunction)   . Esophageal stricture 08/30/2014  . Glucose intolerance (impaired glucose tolerance)   . Hiatal hernia 08/30/2014  . Hypertension   . Hypokalemia 04/11/2013  . 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 Obesity   . Osteoarthritis   . Scoliosis 2016  . Spinal stenosis of lumbar region   . Urinary tract infection 04/11/2013   Allergies  Allergen Reactions  . Lisinopril Other (See Comments)    Body Aches    Social History   Socioeconomic History  . Marital status: Married    Spouse name: Not on file  . Number of children: Not on file  . Years of education: Not on file  . Highest  education level: Not on file  Occupational History  . Not on file  Tobacco Use  . Smoking status: Former Smoker    Packs/day: 1.00    Years: 25.00    Pack years: 25.00  . Smokeless tobacco: Never Used  Vaping Use  . Vaping Use: Never used  Substance and Sexual Activity  . Alcohol use: Not Currently    Comment: Christmas/4th of July  . Drug use: No  . Sexual activity: Yes    Birth control/protection: None  Other Topics Concern  . Not on file  Social History Narrative   Married.     Social Determinants of Health    Financial Resource Strain:   . Difficulty of Paying Living Expenses:   Food Insecurity:   . Worried About Charity fundraiser in the Last Year:   . Arboriculturist in the Last Year:   Transportation Needs:   . Film/video editor (Medical):   Marland Kitchen Lack of Transportation (Non-Medical):   Physical Activity:   . Days of Exercise per Week:   . Minutes of Exercise per Session:   Stress:   . Feeling of Stress :   Social Connections:   . Frequency of Communication with Friends and Family:   . Frequency of Social Gatherings with Friends and Family:   . Attends Religious Services:   . Active Member of Clubs or Organizations:   . Attends Archivist Meetings:   Marland Kitchen Marital Status:     Vitals:   03/30/20 1556  BP: 130/88  Pulse: 95  Resp: 16  Temp: 97.7 F (36.5 C)  SpO2: 98%   Wt Readings from Last 3 Encounters:  03/30/20 270 lb 6 oz (122.6 kg)  01/19/19 267 lb (121.1 kg)  11/19/18 274 lb (124.3 kg)   Body mass index is 36.67 kg/m.  Physical Exam  Nursing note reviewed. Constitutional: He is oriented to person, place, and time. He appears well-developed. No distress.  HENT:  Head: Normocephalic and atraumatic.  Eyes: Pupils are equal, round, and reactive to light. Conjunctivae are normal.  Cardiovascular: Normal rate and regular rhythm.  No murmur heard. Pulses:      Dorsalis pedis pulses are 2+ on the right side and 2+ on the left side.  Respiratory: Effort normal and breath sounds normal. No respiratory distress.  GI: Soft. He exhibits no mass. There is no abdominal tenderness.  Lymphadenopathy:    He has no cervical adenopathy.  Neurological: He is alert and oriented to person, place, and time. No cranial nerve deficit. Gait normal.  Skin: Skin is warm. No rash noted. No erythema.  Psychiatric:  Well groomed, good eye contact.   Diabetic Foot Exam - Simple   Simple Foot Form Diabetic Foot exam was performed with the following findings: Yes 03/30/2020   5:30 PM  Visual Inspection See comments: Yes Sensation Testing Intact to touch and monofilament testing bilaterally: Yes Pulse Check Posterior Tibialis and Dorsalis pulse intact bilaterally: Yes Comments Left charcot foot. Bunions bilateral.      ASSESSMENT AND PLAN:  Keith [name] was seen today for chronic disease management.  Orders Placed This Encounter  Procedures  . Pneumococcal polysaccharide vaccine 23-valent greater than or equal to 2yo subcutaneous/IM  . Comprehensive metabolic panel  . Hemoglobin A1c  . Fructosamine  . Microalbumin / creatinine urine ratio   Lab Results  Component Value Date   HGBA1C 6.2 03/30/2020   Lab Results  Component Value Date  CREATININE 1.23 03/30/2020   BUN 25 (H) 03/30/2020   NA 137 03/30/2020   K 4.2 03/30/2020   CL 104 03/30/2020   CO2 26 03/30/2020   Lab Results  Component Value Date   ALT 19 03/30/2020   AST 21 03/30/2020   ALKPHOS 73 03/30/2020   BILITOT 0.3 03/30/2020   Lab Results  Component Value Date   MICROALBUR 8.1 (H) 03/30/2020   MICROALBUR 7.4 (H) 11/19/2018    Type 2 diabetes mellitus with diabetic neuropathy, unspecified ([place]) HgA1C has been at goal. No changes in current management, will adjust treatment according to HgA1C. Regular exercise and healthy diet with avoidance of added sugar food intake is an important part of treatment and recommended. Annual eye exam, periodic dental and foot care recommended. F/U in 4 months   Essential hypertension BP adequately controlled. No changes in current management. Continue monitoring BP at home and low-salt diet. Eye exam is current.   Hyperlipidemia associated with type 2 diabetes mellitus ([place]) We discussed benefits of statin medications. Recommend resuming simvastatin 40 mg daily. Continue low-fat diet.  Morbid obesity ([place]) With associated DM 2, hypertension, hyperlipidemia, and back OA. We discussed benefits of wt loss as well as  adverse effects of obesity. Consistency with healthy diet and physical activity recommended.   Need for pneumococcal vaccination - Pneumococcal polysaccharide vaccine 23-valent greater than or equal to 2yo subcutaneous/IM  Spinal cord compression (H[facility]) S/P thoracic decompression. Ongoing PT. Following with neurosurgeon.  Return in about 4 months (around 07/30/2020) for F/U and medicare.    Brindle Leyba G. [name], MD  [facility]. [place] office.

## 2020-03-30 NOTE — Assessment & Plan Note (Signed)
HgA1C has been at goal. No changes in current management, will adjust treatment according to HgA1C. Regular exercise and healthy diet with avoidance of added sugar food intake is an important part of treatment and recommended. Annual eye exam, periodic dental and foot care recommended. F/U in 4 months

## 2020-03-31 LAB — COMPREHENSIVE METABOLIC PANEL
ALT: 19 U/L (ref 0–53)
AST: 21 U/L (ref 0–37)
Albumin: 4.2 g/dL (ref 3.5–5.2)
Alkaline Phosphatase: 73 U/L (ref 39–117)
BUN: 25 mg/dL — ABNORMAL HIGH (ref 6–23)
CO2: 26 mEq/L (ref 19–32)
Calcium: 10.4 mg/dL (ref 8.4–10.5)
Chloride: 104 mEq/L (ref 96–112)
Creatinine, Ser: 1.23 mg/dL (ref 0.40–1.50)
GFR: 70.87 mL/min (ref >60.00)
Glucose, Bld: 99 mg/dL (ref 70–99)
Potassium: 4.2 mEq/L (ref 3.5–5.1)
Sodium: 137 mEq/L (ref 135–145)
Total Bilirubin: 0.3 mg/dL (ref 0.2–1.2)
Total Protein: 8.1 g/dL (ref 6.0–8.3)

## 2020-03-31 LAB — MICROALBUMIN / CREATININE URINE RATIO
Creatinine,U: 91.7 mg/dL
Microalb Creat Ratio: 8.8 mg/g (ref 0.0–30.0)
Microalb, Ur: 8.1 mg/dL — ABNORMAL HIGH (ref 0.0–1.9)

## 2020-03-31 LAB — HEMOGLOBIN A1C: Hgb A1c MFr Bld: 6.2 % (ref 4.6–6.5)

## 2020-04-02 DIAGNOSIS — G9520 Unspecified cord compression: Secondary | ICD-10-CM | POA: Insufficient documentation

## 2020-05-31 ENCOUNTER — Encounter (HOSPITAL_COMMUNITY): Payer: Self-pay

## 2020-05-31 ENCOUNTER — Inpatient Hospital Stay (HOSPITAL_COMMUNITY)
Admission: EM | Admit: 2020-05-31 | Discharge: 2020-06-04 | DRG: 552 | Disposition: A | Discharge status: Home / Self Care | Payer: Medicare Other | Attending: Neurological Surgery | Admitting: Neurological Surgery

## 2020-05-31 ENCOUNTER — Other Ambulatory Visit: Payer: Self-pay

## 2020-05-31 DIAGNOSIS — E1169 Type 2 diabetes mellitus with other specified complication: Secondary | ICD-10-CM | POA: Diagnosis present

## 2020-05-31 DIAGNOSIS — M48062 Spinal stenosis, lumbar region with neurogenic claudication: Secondary | ICD-10-CM

## 2020-05-31 DIAGNOSIS — Z20822 Contact with and (suspected) exposure to covid-19: Secondary | ICD-10-CM | POA: Diagnosis present

## 2020-05-31 DIAGNOSIS — M48061 Spinal stenosis, lumbar region without neurogenic claudication: Secondary | ICD-10-CM | POA: Diagnosis not present

## 2020-05-31 DIAGNOSIS — Z8249 Family history of ischemic heart disease and other diseases of the circulatory system: Secondary | ICD-10-CM

## 2020-05-31 DIAGNOSIS — G8929 Other chronic pain: Secondary | ICD-10-CM | POA: Diagnosis present

## 2020-05-31 DIAGNOSIS — M4714 Other spondylosis with myelopathy, thoracic region: Secondary | ICD-10-CM

## 2020-05-31 DIAGNOSIS — Z87891 Personal history of nicotine dependence: Secondary | ICD-10-CM

## 2020-05-31 DIAGNOSIS — E785 Hyperlipidemia, unspecified: Secondary | ICD-10-CM | POA: Diagnosis present

## 2020-05-31 DIAGNOSIS — Z888 Allergy status to other drugs, medicaments and biological substances status: Secondary | ICD-10-CM

## 2020-05-31 DIAGNOSIS — M549 Dorsalgia, unspecified: Secondary | ICD-10-CM

## 2020-05-31 DIAGNOSIS — Z7984 Long term (current) use of oral hypoglycemic drugs: Secondary | ICD-10-CM

## 2020-05-31 DIAGNOSIS — Z833 Family history of diabetes mellitus: Secondary | ICD-10-CM

## 2020-05-31 DIAGNOSIS — I10 Essential (primary) hypertension: Secondary | ICD-10-CM | POA: Diagnosis present

## 2020-05-31 DIAGNOSIS — Z79899 Other long term (current) drug therapy: Secondary | ICD-10-CM

## 2020-05-31 DIAGNOSIS — M4804 Spinal stenosis, thoracic region: Secondary | ICD-10-CM | POA: Diagnosis present

## 2020-05-31 DIAGNOSIS — M5104 Intervertebral disc disorders with myelopathy, thoracic region: Secondary | ICD-10-CM | POA: Diagnosis present

## 2020-05-31 DIAGNOSIS — Z823 Family history of stroke: Secondary | ICD-10-CM

## 2020-05-31 DIAGNOSIS — Z825 Family history of asthma and other chronic lower respiratory diseases: Secondary | ICD-10-CM

## 2020-05-31 NOTE — ED Triage Notes (Signed)
Patient complains of ongoing back pain that is worse since vacation last week. Complains of tingling down both legs. Alert and oriented

## 2020-06-01 ENCOUNTER — Emergency Department (HOSPITAL_COMMUNITY): Payer: Medicare Other

## 2020-06-01 DIAGNOSIS — M48061 Spinal stenosis, lumbar region without neurogenic claudication: Secondary | ICD-10-CM | POA: Diagnosis present

## 2020-06-01 DIAGNOSIS — Z87891 Personal history of nicotine dependence: Secondary | ICD-10-CM | POA: Diagnosis not present

## 2020-06-01 DIAGNOSIS — Z79899 Other long term (current) drug therapy: Secondary | ICD-10-CM | POA: Diagnosis not present

## 2020-06-01 DIAGNOSIS — Z825 Family history of asthma and other chronic lower respiratory diseases: Secondary | ICD-10-CM | POA: Diagnosis not present

## 2020-06-01 DIAGNOSIS — Z20822 Contact with and (suspected) exposure to covid-19: Secondary | ICD-10-CM | POA: Diagnosis present

## 2020-06-01 DIAGNOSIS — Z8249 Family history of ischemic heart disease and other diseases of the circulatory system: Secondary | ICD-10-CM | POA: Diagnosis not present

## 2020-06-01 DIAGNOSIS — Z823 Family history of stroke: Secondary | ICD-10-CM | POA: Diagnosis not present

## 2020-06-01 DIAGNOSIS — I10 Essential (primary) hypertension: Secondary | ICD-10-CM | POA: Diagnosis present

## 2020-06-01 DIAGNOSIS — Z833 Family history of diabetes mellitus: Secondary | ICD-10-CM | POA: Diagnosis not present

## 2020-06-01 DIAGNOSIS — Z888 Allergy status to other drugs, medicaments and biological substances status: Secondary | ICD-10-CM | POA: Diagnosis not present

## 2020-06-01 DIAGNOSIS — E1169 Type 2 diabetes mellitus with other specified complication: Secondary | ICD-10-CM | POA: Diagnosis present

## 2020-06-01 DIAGNOSIS — M5104 Intervertebral disc disorders with myelopathy, thoracic region: Secondary | ICD-10-CM | POA: Diagnosis present

## 2020-06-01 DIAGNOSIS — M4804 Spinal stenosis, thoracic region: Secondary | ICD-10-CM | POA: Diagnosis present

## 2020-06-01 DIAGNOSIS — Z7984 Long term (current) use of oral hypoglycemic drugs: Secondary | ICD-10-CM | POA: Diagnosis not present

## 2020-06-01 DIAGNOSIS — E785 Hyperlipidemia, unspecified: Secondary | ICD-10-CM | POA: Diagnosis present

## 2020-06-01 DIAGNOSIS — G8929 Other chronic pain: Secondary | ICD-10-CM | POA: Diagnosis present

## 2020-06-01 LAB — CBC WITH DIFFERENTIAL/PLATELET
Abs Immature Granulocytes: 0.02 10*3/uL (ref 0.00–0.07)
Basophils Absolute: 0 10*3/uL (ref 0.0–0.1)
Basophils Relative: 1 %
Eosinophils Absolute: 0.2 10*3/uL (ref 0.0–0.5)
Eosinophils Relative: 4 %
HCT: 39.4 % (ref 39.0–52.0)
Hemoglobin: 12.7 g/dL — ABNORMAL LOW (ref 13.0–17.0)
Immature Granulocytes: 0 %
Lymphocytes Relative: 24 %
Lymphs Abs: 1.5 10*3/uL (ref 0.7–4.0)
MCH: 30.8 pg (ref 26.0–34.0)
MCHC: 32.2 g/dL (ref 30.0–36.0)
MCV: 95.6 fL (ref 80.0–100.0)
Monocytes Absolute: 0.8 10*3/uL (ref 0.1–1.0)
Monocytes Relative: 13 %
Neutro Abs: 3.7 10*3/uL (ref 1.7–7.7)
Neutrophils Relative %: 58 %
Platelets: 372 10*3/uL (ref 150–400)
RBC: 4.12 MIL/uL — ABNORMAL LOW (ref 4.22–5.81)
RDW: 15 % (ref 11.5–15.5)
WBC: 6.3 10*3/uL (ref 4.0–10.5)
nRBC: 0 % (ref 0.0–0.2)

## 2020-06-01 LAB — BASIC METABOLIC PANEL
Anion gap: 10 (ref 5–15)
BUN: 15 mg/dL (ref 8–23)
CO2: 26 mmol/L (ref 22–32)
Calcium: 10.8 mg/dL — ABNORMAL HIGH (ref 8.9–10.3)
Chloride: 105 mmol/L (ref 98–111)
Creatinine, Ser: 1.11 mg/dL (ref 0.61–1.24)
GFR calc Af Amer: >60 mL/min (ref >60)
GFR calc non Af Amer: >60 mL/min (ref >60)
Glucose, Bld: 114 mg/dL — ABNORMAL HIGH (ref 70–99)
Potassium: 4.1 mmol/L (ref 3.5–5.1)
Sodium: 141 mmol/L (ref 135–145)

## 2020-06-01 LAB — SARS CORONAVIRUS 2 BY RT PCR (HOSPITAL ORDER, PERFORMED IN ~~LOC~~ HOSPITAL LAB): SARS Coronavirus 2: NEGATIVE

## 2020-06-01 MED ORDER — SENNA 8.6 MG PO TABS
1.0000 | ORAL_TABLET | Freq: Two times a day (BID) | ORAL | Status: DC
  Administered 2020-06-01 – 2020-06-04 (×7): 8.6 mg via ORAL
  Filled 2020-06-01 (×8): qty 1

## 2020-06-01 MED ORDER — ACETAMINOPHEN 650 MG RE SUPP: 650.0000 mg | RECTAL | Status: DC | PRN

## 2020-06-01 MED ORDER — ATENOLOL-CHLORTHALIDONE 50-25 MG PO TABS: 1.0000 | ORAL_TABLET | Freq: Every day | ORAL | Status: DC

## 2020-06-01 MED ORDER — GABAPENTIN 300 MG PO CAPS
300.0000 mg | ORAL_CAPSULE | Freq: Three times a day (TID) | ORAL | Status: DC
  Filled 2020-06-01: qty 1

## 2020-06-01 MED ORDER — METHOCARBAMOL 500 MG PO TABS
500.0000 mg | ORAL_TABLET | Freq: Four times a day (QID) | ORAL | Status: DC | PRN
  Administered 2020-06-01: 500 mg via ORAL
  Filled 2020-06-01 (×2): qty 1

## 2020-06-01 MED ORDER — SODIUM CHLORIDE 0.9 % IV SOLN: INTRAVENOUS | Status: DC

## 2020-06-01 MED ORDER — OXYCODONE HCL 5 MG PO TABS
5.0000 mg | ORAL_TABLET | ORAL | Status: DC | PRN
  Administered 2020-06-01 – 2020-06-04 (×2): 10 mg via ORAL
  Filled 2020-06-01 (×2): qty 2

## 2020-06-01 MED ORDER — METHOCARBAMOL 1000 MG/10ML IJ SOLN
500.0000 mg | Freq: Four times a day (QID) | INTRAVENOUS | Status: DC | PRN
  Filled 2020-06-01: qty 5

## 2020-06-01 MED ORDER — SODIUM CHLORIDE 0.9% FLUSH: 3.0000 mL | INTRAVENOUS | Status: DC | PRN

## 2020-06-01 MED ORDER — HYDROMORPHONE HCL 1 MG/ML IJ SOLN
0.5000 mg | INTRAMUSCULAR | Status: DC | PRN
  Filled 2020-06-01: qty 1

## 2020-06-01 MED ORDER — FENTANYL CITRATE (PF) 100 MCG/2ML IJ SOLN
50.0000 ug | Freq: Once | INTRAMUSCULAR | Status: AC
  Administered 2020-06-01: 50 ug via INTRAVENOUS
  Filled 2020-06-01: qty 2

## 2020-06-01 MED ORDER — OXYCODONE HCL ER 10 MG PO T12A
10.0000 mg | EXTENDED_RELEASE_TABLET | Freq: Three times a day (TID) | ORAL | Status: DC
  Administered 2020-06-01 – 2020-06-04 (×10): 10 mg via ORAL
  Filled 2020-06-01 (×10): qty 1

## 2020-06-01 MED ORDER — METFORMIN HCL 500 MG PO TABS
250.0000 mg | ORAL_TABLET | Freq: Every day | ORAL | Status: DC
  Administered 2020-06-03 – 2020-06-04 (×2): 250 mg via ORAL
  Filled 2020-06-01 (×2): qty 1

## 2020-06-01 MED ORDER — DULOXETINE HCL 60 MG PO CPEP
60.0000 mg | ORAL_CAPSULE | Freq: Every day | ORAL | Status: DC
  Administered 2020-06-01 – 2020-06-04 (×4): 60 mg via ORAL
  Filled 2020-06-01 (×5): qty 1

## 2020-06-01 MED ORDER — ONDANSETRON HCL 4 MG/2ML IJ SOLN: 4.0000 mg | Freq: Four times a day (QID) | INTRAMUSCULAR | Status: DC | PRN

## 2020-06-01 MED ORDER — SIMVASTATIN 20 MG PO TABS
40.0000 mg | ORAL_TABLET | Freq: Every day | ORAL | Status: DC
  Administered 2020-06-02 – 2020-06-04 (×3): 40 mg via ORAL
  Filled 2020-06-01 (×3): qty 2

## 2020-06-01 MED ORDER — SODIUM CHLORIDE 0.9 % IV SOLN: 250.0000 mL | INTRAVENOUS | Status: DC

## 2020-06-01 MED ORDER — DEXAMETHASONE SODIUM PHOSPHATE 4 MG/ML IJ SOLN
4.0000 mg | Freq: Four times a day (QID) | INTRAMUSCULAR | Status: DC
  Administered 2020-06-01 – 2020-06-04 (×14): 4 mg via INTRAVENOUS
  Filled 2020-06-01 (×14): qty 1

## 2020-06-01 MED ORDER — FENTANYL CITRATE (PF) 100 MCG/2ML IJ SOLN
100.0000 ug | INTRAMUSCULAR | Status: DC | PRN
  Administered 2020-06-01: 100 ug via INTRAVENOUS
  Filled 2020-06-01: qty 2

## 2020-06-01 MED ORDER — SENNOSIDES-DOCUSATE SODIUM 8.6-50 MG PO TABS: 1.0000 | ORAL_TABLET | Freq: Every evening | ORAL | Status: DC | PRN

## 2020-06-01 MED ORDER — FLEET ENEMA 7-19 GM/118ML RE ENEM: 1.0000 | ENEMA | Freq: Once | RECTAL | Status: DC | PRN

## 2020-06-01 MED ORDER — ACETAMINOPHEN 325 MG PO TABS: 650.0000 mg | ORAL_TABLET | ORAL | Status: DC | PRN

## 2020-06-01 MED ORDER — DOCUSATE SODIUM 100 MG PO CAPS
100.0000 mg | ORAL_CAPSULE | Freq: Two times a day (BID) | ORAL | Status: DC
  Administered 2020-06-01 – 2020-06-04 (×6): 100 mg via ORAL
  Filled 2020-06-01 (×6): qty 1

## 2020-06-01 MED ORDER — ATENOLOL 50 MG PO TABS
50.0000 mg | ORAL_TABLET | Freq: Every day | ORAL | Status: DC
  Administered 2020-06-01 – 2020-06-04 (×4): 50 mg via ORAL
  Filled 2020-06-01 (×4): qty 1

## 2020-06-01 MED ORDER — FENTANYL CITRATE (PF) 100 MCG/2ML IJ SOLN
100.0000 ug | Freq: Once | INTRAMUSCULAR | Status: AC
  Administered 2020-06-01: 100 ug via INTRAVENOUS
  Filled 2020-06-01: qty 2

## 2020-06-01 MED ORDER — CHLORTHALIDONE 25 MG PO TABS
25.0000 mg | ORAL_TABLET | Freq: Every day | ORAL | Status: DC
  Administered 2020-06-01 – 2020-06-04 (×4): 25 mg via ORAL
  Filled 2020-06-01 (×4): qty 1

## 2020-06-01 MED ORDER — ONDANSETRON HCL 4 MG PO TABS
4.0000 mg | ORAL_TABLET | Freq: Four times a day (QID) | ORAL | Status: DC | PRN
  Filled 2020-06-01: qty 1

## 2020-06-01 MED ORDER — ZOLPIDEM TARTRATE 5 MG PO TABS: 5.0000 mg | ORAL_TABLET | Freq: Every evening | ORAL | Status: DC | PRN

## 2020-06-01 MED ORDER — SODIUM CHLORIDE 0.9% FLUSH
3.0000 mL | Freq: Two times a day (BID) | INTRAVENOUS | Status: DC
  Administered 2020-06-01 – 2020-06-04 (×6): 3 mL via INTRAVENOUS

## 2020-06-01 MED ORDER — HYDROCODONE-ACETAMINOPHEN 5-325 MG PO TABS: 1.0000 | ORAL_TABLET | ORAL | Status: DC | PRN

## 2020-06-01 MED ORDER — LORAZEPAM 1 MG PO TABS: 1.0000 mg | ORAL_TABLET | Freq: Once | ORAL | Status: DC

## 2020-06-01 MED ORDER — CLONIDINE HCL 0.1 MG PO TABS
0.1000 mg | ORAL_TABLET | Freq: Every day | ORAL | Status: DC
  Administered 2020-06-01 – 2020-06-04 (×4): 0.1 mg via ORAL
  Filled 2020-06-01 (×4): qty 1

## 2020-06-01 MED ORDER — METFORMIN HCL 500 MG PO TABS: 250.0000 mg | ORAL_TABLET | Freq: Every day | ORAL | Status: DC

## 2020-06-01 MED ORDER — MENTHOL 3 MG MT LOZG
1.0000 | LOZENGE | OROMUCOSAL | Status: DC | PRN
  Filled 2020-06-01: qty 9

## 2020-06-01 MED ORDER — ACETAMINOPHEN 500 MG PO TABS
1000.0000 mg | ORAL_TABLET | Freq: Four times a day (QID) | ORAL | Status: AC
  Administered 2020-06-01 – 2020-06-02 (×3): 1000 mg via ORAL
  Filled 2020-06-01 (×3): qty 2

## 2020-06-01 MED ORDER — BISACODYL 10 MG RE SUPP: 10.0000 mg | Freq: Every day | RECTAL | Status: DC | PRN

## 2020-06-01 MED ORDER — PHENOL 1.4 % MT LIQD
1.0000 | OROMUCOSAL | Status: DC | PRN
  Filled 2020-06-01: qty 177

## 2020-06-01 MED ORDER — GADOBUTROL 1 MMOL/ML IV SOLN
10.0000 mL | Freq: Once | INTRAVENOUS | Status: AC | PRN
  Administered 2020-06-01: 10 mL via INTRAVENOUS

## 2020-06-01 NOTE — ED Provider Notes (Signed)
[facility] EMERGENCY DEPARTMENT Provider Note   CSN: [id number] Arrival date & time: 05/31/20  [id number]     History Chief Complaint - back pain and weakness  Keith [name] is a 69 y.o. male.  The history is provided by the patient.  Back Pain Location:  Thoracic spine and lumbar spine Quality:  Aching Radiates to:  L thigh and R thigh Pain severity:  Moderate Onset quality:  Gradual Duration:  5 days Timing:  Constant Progression:  Worsening Chronicity:  New Relieved by:  Being still Worsened by:  Ambulation and standing Associated symptoms: leg pain, numbness and weakness   Associated symptoms: no abdominal pain, no bladder incontinence, no bowel incontinence, no dysuria, no fever and no headaches   Risk factors: obesity   Risk factors: no hx of cancer   Patient with history of diabetes, hypertension, previous thoracic surgery presents with back pain and leg numbness and weakness. Patient reports he has had back pain since his surgery in 2019, but worsened over the past 5 days.  He also reports over the past 5 days has been having increasing numbness in his legs and left leg weakness.  He reports he now has to use a walker to ambulate.  He typically uses a cane to ambulate.  He reports he never fully recovered from the previous surgery due to the COVID-19 pandemic and not been able to get physical therapy, but this weakness is acutely worsened. No fevers or vomiting.  No IVDA.  No history of cancer.  No recent trauma.  He reports the symptoms feel similar to when he had previous back issues.  No incontinence.     Past Medical History:  Diagnosis Date  . Allergy   . Chronic back pain   . Diabetes mellitus without complication ([place])   . Duodenal ulcer hemorrhage 08/29/2014  . ED (erectile dysfunction)   . Esophageal stricture 08/30/2014  . Glucose intolerance (impaired glucose tolerance)   . Hiatal hernia 08/30/2014  . Hypertension   . Hypokalemia  04/11/2013  . 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 Obesity   . Osteoarthritis   . Scoliosis 2016  . Spinal stenosis of lumbar region   . Urinary tract infection 04/11/2013    Patient Active Problem List   Diagnosis Date Noted  . Spinal cord compression ([place]) 04/02/2020  . Thoracic myelopathy 07/25/2018  . Hyperlipidemia associated with type 2 diabetes mellitus ([place]) 11/14/2016  . Type 2 diabetes mellitus with diabetic neuropathy, unspecified ([place]) 11/14/2016  . Routine general medical examination at a health care facility 11/14/2015  . Hiatal hernia 08/30/2014  . Allergic rhinitis 04/10/2013  . Spinal stenosis of lumbar region 11/06/2012  . Allergic rhinitis due to pollen 01/15/2011  . OSTEOARTHRITIS 11/02/2008  . HIP PAIN, LEFT 02/09/2008  . Morbid obesity ([place]) 11/07/2007  . ERECTILE DYSFUNCTION, MILD 11/07/2007  . Essential hypertension 11/07/2007    Past Surgical History:  Procedure Laterality Date  . COLONOSCOPY  2008  . ESOPHAGOGASTRODUODENOSCOPY N/A 08/29/2014   Procedure: ESOPHAGOGASTRODUODENOSCOPY (EGD);  Surgeon: Lafayette Dragon, MD;  Location: [facility];  Service: Endoscopy;  Laterality: N/A;  . LAMINECTOMY N/A 07/28/2018   Procedure: THORACIC ELEVEN- THORACIC TWELVE POSTERIOR DECOMPRESSION LAMINECTOMY;  Surgeon: Judith Part, MD;  Location: [facility];  Service: Neurosurgery;  Laterality: N/A;  . LUMBAR LAMINECTOMY    . NASAL POLYP SURGERY    . TONSILLECTOMY         Family History  Problem Relation Age of Onset  .  Diabetes Mother   . Asthma Mother   . Hypertension Father   . Stroke Father   . Colon cancer Neg Hx     Social History   Tobacco Use  . Smoking status: Former Smoker    Packs/day: 1.00    Years: 25.00    Pack years: 25.00  . Smokeless tobacco: Never Used  Vaping Use  . Vaping Use: Never used  Substance Use Topics  . Alcohol use: Not Currently    Comment: Christmas/4th of July  . Drug use: No    Home Medications Prior to Admission  medications   Medication Sig Start Date End Date Taking? Authorizing Provider  atenolol-chlorthalidone (TENORETIC) 50-25 MG tablet Take 1 tablet by mouth daily. Pt needs appt for further refills. 03/30/20   [name], [name], MD  cloNIDine (CATAPRES) 0.1 MG tablet Take 1 tablet (0.1 mg total) by mouth daily. 03/30/20   [name], [name], MD  metFORMIN (GLUCOPHAGE) 500 MG tablet Take 0.5 tablets (250 mg total) by mouth daily with breakfast. 03/30/20   [name], [name], MD  Multiple Vitamin (MULITIVITAMIN WITH MINERALS) TABS Take 1 tablet by mouth daily.    [provider]  OXYCONTIN 10 MG 12 hr tablet Take 10 mg by mouth 3 (three) times daily.  10/21/18   [provider]  sildenafil (REVATIO) 20 MG tablet TAKE 1-2 TABLETS 2-3 HOURS PRIOR TO SEX 12/07/19   [name], [name], MD  simvastatin (ZOCOR) 40 MG tablet Take 1 tablet (40 mg total) by mouth daily. 03/30/20   [name], [name], MD    Allergies    Lisinopril  Review of Systems   Review of Systems  Constitutional: Negative for fever.  Respiratory: Negative for cough and shortness of breath.   Gastrointestinal: Negative for abdominal pain and bowel incontinence.  Genitourinary: Negative for bladder incontinence and dysuria.  Musculoskeletal: Positive for back pain.  Neurological: Positive for weakness and numbness. Negative for headaches.  All other systems reviewed and are negative.   Physical Exam Updated Vital Signs BP 131/82 (BP Location: Right Arm)   Pulse 64   Temp 98.6 F (37 C) (Oral)   Resp 14   SpO2 99%   Physical Exam CONSTITUTIONAL: Well developed/well nourished HEAD: Normocephalic/atraumatic EYES: EOMI/PERRL ENMT: Mucous membranes moist NECK: supple no meningeal signs SPINE/BACK: No cervical spine tenderness, diffuse tenderness thoracic spine with well-healed scar noted.  Lumbar tenderness noted.  No bruising/crepitance/stepoffs noted to spine CV: S1/S2 noted, no murmurs/rubs/gallops noted LUNGS: Lungs are  clear to auscultation bilaterally, no apparent distress ABDOMEN: soft, nontender, no rebound or guarding GU:no cva tenderness NEURO: Awake/alert, patient has 3 out of 5 strength of left hip flexion and knee flexion extension, 4 out of 5 strength of right hip flexion and knee flexion extension, great toe extension intact bilaterally, no clonus bilaterally, plantar reflex appropriate (toes downgoing) but limited due to the left ankle deformity, no sensory deficit in any dermatome.  Equal patellar/achilles reflex noted  in bilateral lower extremities.   EXTREMITIES: pulses normal, full ROM, chronic left ankle deformity SKIN: warm, color normal PSYCH: no abnormalities of mood noted, alert and oriented to situation   ED Results / Procedures / Treatments   Labs (all labs ordered are listed, but only abnormal results are displayed) Labs Reviewed  BASIC METABOLIC PANEL - Abnormal; Notable for the following components:      Result Value   Glucose, Bld 114 (*)    Calcium 10.8 (*)    All other components  within normal limits  CBC WITH DIFFERENTIAL/PLATELET - Abnormal; Notable for the following components:   RBC 4.12 (*)    Hemoglobin 12.7 (*)    All other components within normal limits  SARS CORONAVIRUS 2 BY RT PCR (HOSPITAL ORDER, [facility])    EKG None  Radiology No results found.  Procedures Procedures Medications Ordered in ED Medications  LORazepam (ATIVAN) tablet 1 mg (1 mg Oral Not Given 06/01/20 0249)  fentaNYL (SUBLIMAZE) injection 100 mcg (100 mcg Intravenous Given 06/01/20 0055)  gadobutrol (GADAVIST) 1 MMOL/ML injection 10 mL (10 mLs Intravenous Contrast Given 06/01/20 0411)  fentaNYL (SUBLIMAZE) injection 50 mcg (50 mcg Intravenous Given 06/01/20 0559)    ED Course  I have reviewed the triage vital signs and the nursing notes.  Pertinent labs & imaging results that were available during my care of the patient were reviewed by me and considered in  my medical decision making (see chart for details).    MDM Rules/Calculators/A&P                          12:38 AM Patient with previous history of thoracic myelopathy that required T11-12 laminectomy in 2019.  Patient at that time had left leg weakness and overflow incontinence.  Patient now reports pain radiating from his back to his legs.  He does have weakness in the left leg compared to the right. He reports this all worsened over the past 5 days. Patient will require MRI thoracic and lumbar spine. 6:13 AM MRI results reveal cord compression in the thoracic region. Patient currently stable at this time, no change in previous neuro deficits.  I discussed the case with on-call neurosurgery, and Dr. Kathyrn Sheriff will be involved and likely admit the patient.  He may require operative management. Patient agreeable with plan Final Clinical Impression(s) / ED Diagnoses Final diagnoses:  Thoracic myelopathy    Rx / DC Orders ED Discharge Orders    None       Ripley Fraise, MD 06/01/20 [phone]

## 2020-06-01 NOTE — ED Notes (Signed)
Patient currently at MRI

## 2020-06-01 NOTE — ED Notes (Signed)
Patient transported to X-ray 

## 2020-06-01 NOTE — ED Notes (Signed)
Surgery PA at bedside.  

## 2020-06-01 NOTE — Progress Notes (Signed)
Rehab Admissions Coordinator Note:  Patient was screened by Cleatrice Burke for appropriateness for an Inpatient Acute Rehab Consult per PT recommendation. Noted ongoing neurosurgical evaluation. If patient fails to be able to progress home and would like to be considered for admit, please place rehab consult. Please advise.  Cleatrice Burke RN MSN 06/01/2020, 5:26 PM  I can be reached at [phone].

## 2020-06-01 NOTE — Progress Notes (Signed)
  NEUROSURGERY PROGRESS NOTE   Received call from Dr Christy Gentles regarding patient. 69 year old male with several day history of acute on chronic back pain and worsening BLE N/T/W. He is s/p T11-12 lami for thoracic stenosis with myelopathy. Made a great recovery initially, ambulating with a cane. Since onset of current sx, has had worsening mobility, now requiring rolling walker. LLE ?weaker.  MRI T spine with improved but residual spinal stenosis at T11-12. MRI L spine with severe spinal stenosis at L3-4 and to a lesser extent L4-5. Will obtain dynamic TL films and scoliosis films. Patient will need admission for further management. Keep NPO for now. Full H/P to follow.

## 2020-06-01 NOTE — Evaluation (Signed)
Physical Therapy Evaluation Patient Details Name: Keith [name] MRN: [id number] DOB: 10-Aug-1951 Today's Date: 06/01/2020   History of Present Illness  69 yo male with onset of worsening back pain and notation of spinal cord compression in June 2021 was admitted to [facility].  Pt has hx of thoracic spinal stenosis with myelopathy and T11-12 laminectomy, chronic back pain afterward.  Now is having numbness and tingling on LE's, ant thigh to feet on anterior legs.  PMHx:  DM, PN, spinal stenosis on lumbar spine, OA L hip, duodenal ulcer, HTN, scoliosis, UTI  Clinical Impression  Pt was seen for mobility on RW with notable instability of gait including posterior listing on walker.  Pt is appropriate for a short stay in rehab to regain strength and control of balance, to increase safety with transitions and increase awareness of spinal precautions with all movement.  Pt is quite motivated and interested in having therapy intervention to reduce his fall risk and help with spinal precautions.    Follow Up Recommendations CIR    Equipment Recommendations  Rolling walker with 5" wheels    Recommendations for Other Services       Precautions / Restrictions Precautions Precautions: Fall Precaution Comments: back precautions Restrictions Weight Bearing Restrictions: No Other Position/Activity Restrictions: spinal precautions      Mobility  Bed Mobility Overal bed mobility: Modified Independent             General bed mobility comments: cues for alignment  Transfers Overall transfer level: Needs assistance Equipment used: Rolling walker (2 wheeled);1 person hand held assist Transfers: Sit to/from Stand Sit to Stand: Min guard;Min assist         General transfer comment: one moment of backward LOB in standing initially  Ambulation/Gait Ambulation/Gait assistance: Min guard;Min assist Gait Distance (Feet): 20 Feet Assistive device: Rolling walker (2 wheeled);1 person hand held  assist Gait Pattern/deviations: Step-through pattern;Step-to pattern;Wide base of support;Shuffle;Decreased stride length Gait velocity: reduced Gait velocity interpretation: <1.31 ft/sec, indicative of household ambulator General Gait Details: LLE is weak, has difficulty with clearing the floor  Stairs            Wheelchair Mobility    Modified Rankin (Stroke Patients Only)       Balance Overall balance assessment: Needs assistance Sitting-balance support: Feet supported Sitting balance-Leahy Scale: Fair   Postural control: Posterior lean Standing balance support: Bilateral upper extremity supported;During functional activity Standing balance-Leahy Scale: Fair Standing balance comment: less than fair with dynamic standing                             Pertinent Vitals/Pain Pain Assessment: 0-10 Pain Score: 7  Pain Location: back  Pain Descriptors / Indicators: Grimacing;Guarding Pain Intervention(s): Limited activity within patient's tolerance;Monitored during session;Repositioned    Home Living Family/patient expects to be discharged to:: Inpatient rehab Living Arrangements: Spouse/significant other Available Help at Discharge: Family;Available 24 hours/day Type of Home: House Home Access: Stairs to enter Entrance Stairs-Rails: Right Entrance Stairs-Number of Steps: 2 Home Layout: Two level Home Equipment: Walker - 4 wheels;Cane - single point;Grab bars - toilet;Grab bars - tub/shower;Bedside commode      Prior Function Level of Independence: Independent with assistive device(s)         Comments: was on [facility] but now rollator      Hand Dominance   Dominant Hand: Right    Extremity/Trunk Assessment   Upper Extremity Assessment Upper Extremity Assessment: Overall WFL for  tasks assessed    Lower Extremity Assessment Lower Extremity Assessment: Generalized weakness;LLE deficits/detail LLE Deficits / Details: 3+ to 4+ LLE Sensation:  decreased light touch LLE Coordination: decreased gross motor    Cervical / Trunk Assessment Cervical / Trunk Assessment: Other exceptions (had previous spinal laminectomy and has scoliosis) Cervical / Trunk Exceptions: previous spinal surgery  Communication   Communication: No difficulties  Cognition Arousal/Alertness: Awake/alert Behavior During Therapy: WFL for tasks assessed/performed Overall Cognitive Status: Within Functional Limits for tasks assessed                                        General Comments General comments (skin integrity, edema, etc.): pt was able to stand and walk but lists backward strongly, then recovers balance with prompt.  Has weakness on LLE, requires stair control of balance to succeed at home    Exercises     Assessment/Plan    PT Assessment Patient needs continued PT services  PT Problem List Decreased strength;Decreased activity tolerance;Decreased balance;Decreased coordination;Decreased safety awareness;Cardiopulmonary status limiting activity;Pain       PT Treatment Interventions DME instruction;Gait training;Stair training;Functional mobility training;Therapeutic activities;Therapeutic exercise;Balance training;Neuromuscular re-education;Patient/family education    PT Goals (Current goals can be found in the Care Plan section)  Acute Rehab PT Goals Patient Stated Goal: to be safer and get home PT Goal Formulation: With patient Time For Goal Achievement: 06/15/20 Potential to Achieve Goals: Good    Frequency Min 3X/week   Barriers to discharge Inaccessible home environment;Decreased caregiver support home with wife with stairs to ascend    Co-evaluation               AM-PAC PT "6 Clicks" Mobility  Outcome Measure Help needed turning from your back to your side while in a flat bed without using bedrails?: None Help needed moving from lying on your back to sitting on the side of a flat bed without using  bedrails?: A Little Help needed moving to and from a bed to a chair (including a wheelchair)?: A Little Help needed standing up from a chair using your arms (e.g., wheelchair or bedside chair)?: A Little Help needed to walk in hospital room?: A Little Help needed climbing 3-5 steps with a railing? : A Lot 6 Click Score: 18    End of Session Equipment Utilized During Treatment: Gait belt Activity Tolerance: Treatment limited secondary to medical complications (Comment) Patient left: in bed;with call bell/phone within reach Nurse Communication: Mobility status PT Visit Diagnosis: Unsteadiness on feet (R26.81);Muscle weakness (generalized) (M62.81);Difficulty in walking, not elsewhere classified (R26.2);Pain Pain - part of body:  (spine)    Time: 2595-6387 PT Time Calculation (min) (ACUTE ONLY): 26 min   Charges:   PT Evaluation $PT Eval Moderate Complexity: 1 Mod PT Treatments $Gait Training: 8-22 mins       Ramond Dial 06/01/2020, 4:34 PM   Mee Hives, PT MS Acute Rehab Dept. Number: [place] [facility] [place]

## 2020-06-01 NOTE — H&P (Signed)
Chief Complaint   No chief complaint on file.  HPI   Consult requested by: Dr Christy Gentles, [facility] Reason for consult: low back pain, thoracic and lumbar spinal stenosis  HPI: Keith [name] is a 69 y.o. male with history of HTN, HDL, DM2 and thoracic spinal stenosis with myelopathy s/p T11-12 laminectomy in 2019 by Dr Zada Finders who presented to ED with acute on chronic LBP and worsening N/T/W in BLE. An MRI T and L spine were ordered EDP and revealed improved but persistent thoracic spinal stenosis and severe lumbar spinal stenosis. I was called for further recommendations and came by to evaluate the patient. Since his surgery in 2019, he has had chronic LBP for which he takes Oxycontin 10mg  BID. He has also been undergoing lumbar ESI by Dr Brien Few q 6 months for his lumbar spinal stenosis. Injections improved pain by >80%.  Starting several weeks ago while driving on vacation, he developed recurrent N/T in BLE, L>R that starts anterior thigh and radiates anteriorly to feet. He thought nothing of it, but on the way home, developed worsening symptoms and worsening LBP. Symptoms feel similar to his lumbar spinal stenosis flare ups, although more severe. He does endorse LLE weakness for which he now uses a rolling walker, previously using a cane. No bowel/bladder dysfunction.   Pain improved since being in ED with fentanyl.  He is not on any blood thinning medications.  Patient Active Problem List   Diagnosis Date Noted  . Spinal cord compression ([place]) 04/02/2020  . Thoracic myelopathy 07/25/2018  . Hyperlipidemia associated with type 2 diabetes mellitus ([place]) 11/14/2016  . Type 2 diabetes mellitus with diabetic neuropathy, unspecified ([place]) 11/14/2016  . Routine general medical examination at a health care facility 11/14/2015  . Hiatal hernia 08/30/2014  . Allergic rhinitis 04/10/2013  . Spinal stenosis of lumbar region 11/06/2012  . Allergic rhinitis due to pollen 01/15/2011  .  OSTEOARTHRITIS 11/02/2008  . HIP PAIN, LEFT 02/09/2008  . Morbid obesity ([place]) 11/07/2007  . ERECTILE DYSFUNCTION, MILD 11/07/2007  . Essential hypertension 11/07/2007    PMH: Past Medical History:  Diagnosis Date  . Allergy   . Chronic back pain   . Diabetes mellitus without complication ([place])   . Duodenal ulcer hemorrhage 08/29/2014  . ED (erectile dysfunction)   . Esophageal stricture 08/30/2014  . Glucose intolerance (impaired glucose tolerance)   . Hiatal hernia 08/30/2014  . Hypertension   . Hypokalemia 04/11/2013  . 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 Obesity   . Osteoarthritis   . Scoliosis 2016  . Spinal stenosis of lumbar region   . Urinary tract infection 04/11/2013    PSH: Past Surgical History:  Procedure Laterality Date  . COLONOSCOPY  2008  . ESOPHAGOGASTRODUODENOSCOPY N/A 08/29/2014   Procedure: ESOPHAGOGASTRODUODENOSCOPY (EGD);  Surgeon: Lafayette Dragon, MD;  Location: [facility];  Service: Endoscopy;  Laterality: N/A;  . LAMINECTOMY N/A 07/28/2018   Procedure: THORACIC ELEVEN- THORACIC TWELVE POSTERIOR DECOMPRESSION LAMINECTOMY;  Surgeon: Judith Part, MD;  Location: [facility];  Service: Neurosurgery;  Laterality: N/A;  . LUMBAR LAMINECTOMY    . NASAL POLYP SURGERY    . TONSILLECTOMY      (Not in a hospital admission)   SH: Social History   Tobacco Use  . Smoking status: Former Smoker    Packs/day: 1.00    Years: 25.00    Pack years: 25.00  . Smokeless tobacco: Never Used  Vaping Use  . Vaping Use: Never used  Substance Use  Topics  . Alcohol use: Not Currently    Comment: Christmas/4th of July  . Drug use: No    MEDS: Prior to Admission medications   Medication Sig Start Date End Date Taking? Authorizing Provider  atenolol-chlorthalidone (TENORETIC) 50-25 MG tablet Take 1 tablet by mouth daily. Pt needs appt for further refills. 03/30/20  Yes [name], [name], MD  cloNIDine (CATAPRES) 0.1 MG tablet Take 1 tablet (0.1 mg total) by mouth daily.  03/30/20  Yes [name], [name], MD  DULoxetine (CYMBALTA) 30 MG capsule Take 60 mg by mouth daily. 05/01/20  Yes [provider]  metFORMIN (GLUCOPHAGE) 500 MG tablet Take 0.5 tablets (250 mg total) by mouth daily with breakfast. 03/30/20  Yes [name], [name], MD  Multiple Vitamin (MULITIVITAMIN WITH MINERALS) TABS Take 1 tablet by mouth daily.   Yes [provider]  OXYCONTIN 10 MG 12 hr tablet Take 10 mg by mouth 3 (three) times daily.  10/21/18  Yes [provider]  sildenafil (REVATIO) 20 MG tablet TAKE 1-2 TABLETS 2-3 HOURS PRIOR TO SEX Patient taking differently: Take 20 mg by mouth as needed (prior to sex).  12/07/19  Yes [name], [name], MD  simvastatin (ZOCOR) 40 MG tablet Take 1 tablet (40 mg total) by mouth daily. 03/30/20  Yes [name], [name], MD    ALLERGY: Allergies  Allergen Reactions  . Lisinopril Other (See Comments)    Body Aches    Social History   Tobacco Use  . Smoking status: Former Smoker    Packs/day: 1.00    Years: 25.00    Pack years: 25.00  . Smokeless tobacco: Never Used  Substance Use Topics  . Alcohol use: Not Currently    Comment: Christmas/4th of July     Family History  Problem Relation Age of Onset  . Diabetes Mother   . Asthma Mother   . Hypertension Father   . Stroke Father   . Colon cancer Neg Hx      ROS   Review of Systems  Constitutional: Negative for chills and fever.  HENT: Negative.   Eyes: Negative.   Respiratory: Negative.   Cardiovascular: Negative.   Gastrointestinal: Negative.   Genitourinary: Negative.   Musculoskeletal: Positive for back pain and myalgias. Negative for falls.  Skin: Negative.   Neurological: Positive for tingling and weakness. Negative for dizziness, tremors, speech change and headaches.    Exam   Vitals:   06/01/20 0530 06/01/20 0600  BP: 117/66 135/87  Pulse: 69 78  Resp:  16  Temp:    SpO2: 97% 100%   General appearance: WDWN, NAD Eyes: No scleral  injection Cardiovascular: Regular rate and rhythm without murmurs, rubs, gallops. No edema or variciosities. Distal pulses normal. Pulmonary: Effort normal, non-labored breathing Musculoskeletal:     Muscle tone upper extremities: Normal    Muscle tone lower extremities: Normal    Motor exam: Upper Extremities Deltoid Bicep Tricep Grip  Right 5/5 5/5 5/5 5/5  Left 5/5 5/5 5/5 5/5   Lower Extremity IP Quad PF DF EHL  Right 5/5 5/5 5/5 5/5 5/5  Left 3/5 4/5 5/5 5/5 5/5   Neurological Mental Status:    - Patient is awake, alert, oriented to person, place, month, year, and situation    - Patient is able to give a clear and coherent history.    - No signs of aphasia or neglect Cranial Nerves    - II: Visual Fields are full. PERRL    -  III/IV/VI: EOMI without ptosis or diploplia.     - V: Facial sensation is grossly normal    - VII: Facial movement is symmetric.     - VIII: hearing is intact to voice    - X: Uvula elevates symmetrically    - XI: Shoulder shrug is symmetric.    - XII: tongue is midline without atrophy or fasciculations.  Sensory: Sensation grossly intact to LT  Results - Imaging/Labs   Results for orders placed or performed during the hospital encounter of 05/31/20 (from the past 48 hour(s))  Basic metabolic panel     Status: Abnormal   Collection Time: 06/01/20 12:49 AM  Result Value Ref Range   Sodium 141 135 - 145 mmol/L   Potassium 4.1 3.5 - 5.1 mmol/L   Chloride 105 98 - 111 mmol/L   CO2 26 22 - 32 mmol/L   Glucose, Bld 114 (H) 70 - 99 mg/dL    Comment: Glucose reference range applies only to samples taken after fasting for at least 8 hours.   BUN 15 8 - 23 mg/dL   Creatinine, Ser 1.11 0.61 - 1.24 mg/dL   Calcium 10.8 (H) 8.9 - 10.3 mg/dL   GFR calc non Af Amer >60 >60 mL/min   GFR calc Af Amer >60 >60 mL/min   Anion gap 10 5 - 15    Comment: Performed at [facility] [address], [place], [place] [address]  CBC with Differential/Platelet      Status: Abnormal   Collection Time: 06/01/20 12:49 AM  Result Value Ref Range   WBC 6.3 4.0 - 10.5 K/uL   RBC 4.12 (L) 4.22 - 5.81 MIL/uL   Hemoglobin 12.7 (L) 13.0 - 17.0 g/dL   HCT 39.4 39 - 52 %   MCV 95.6 80.0 - 100.0 fL   MCH 30.8 26.0 - 34.0 pg   MCHC 32.2 30.0 - 36.0 g/dL   RDW 15.0 11.5 - 15.5 %   Platelets 372 150 - 400 K/uL   nRBC 0.0 0.0 - 0.2 %   Neutrophils Relative % 58 %   Neutro Abs 3.7 1.7 - 7.7 K/uL   Lymphocytes Relative 24 %   Lymphs Abs 1.5 0.7 - 4.0 K/uL   Monocytes Relative 13 %   Monocytes Absolute 0.8 0 - 1 K/uL   Eosinophils Relative 4 %   Eosinophils Absolute 0.2 0 - 0 K/uL   Basophils Relative 1 %   Basophils Absolute 0.0 0 - 0 K/uL   Immature Granulocytes 0 %   Abs Immature Granulocytes 0.02 0.00 - 0.07 K/uL    Comment: Performed at [facility], [address] [place], [place] [address]    No results found.  Impression/Plan   69 y.o. male with several week history of worsening back and BLE paresthesias. He does have LLE IP and quad weakness, which was present prior, although may be slightly worse. He is voiding normal. MRI T and L spine reviewed.  MRI T spine: prior T11-12 laminectomy with improved stenosis although still present. MRI L spine: multilevel varying degrees of spinal and foraminal stenosis mostly prominent at L3-4 and L4-5.   Patient has all degenerative findings without any acute pathology. I do not see a need to emergent NS intervention. Will admit for pain control and start on steroids and PT/OT. Hopefully with steroids on board, his pain will improve as well as his gair and he can be discharged in the next 24-48 hours for  outpatient f/u with Dr Zada Finders.   Ferne Reus, PA-C [facility] [employer]

## 2020-06-02 NOTE — Progress Notes (Signed)
Inpatient Rehabilitation Admissions Coordinator  Discussed with [place], [place]. Patient not likely to need CIR admit. I will follow his therapy progress.  Danne Baxter, RN, MSN Rehab Admissions Coordinator [phone] 06/02/2020 8:32 AM

## 2020-06-02 NOTE — TOC Initial Note (Addendum)
Transition of Care [facility]) - Initial/Assessment Note    Patient Details  Name: Keith [name] MRN: [id number] Date of Birth: 1951-06-24  Transition of [facility]) CM/SW Contact:    Marilu Favre, RN Phone Number: 06/02/2020, 1:07 PM  Clinical Narrative:                 Patient from home with wife. Has walker, exercise ball, exercise bands and mat at home.   Patient recently had OP PT at [facility]   [phone] and would like to return. Called same spoke to [place] she will fax a referral form . Await fax   [phone] have not received fax. Called [place] D[place] back fax is pending. Received referral form from [place] , placed on chart for MD signature. Secure chatted MD.   Expected Discharge Plan: Home/Self Care Barriers to Discharge: Continued Medical Work up   Patient Goals and CMS Choice Patient states their goals for this hospitalization and ongoing recovery are:: to return to home CMS Medicare.gov Compare Post Acute Care list provided to:: Patient Choice offered to / list presented to : Patient  Expected Discharge Plan and Services Expected Discharge Plan: Home/Self Care   Discharge Planning Services: CM Consult Post Acute Care Choice:  (OP PT) Living arrangements for the past 2 months: Single Family Home                 DME Arranged: N/A         HH Arranged: NA          Prior Living Arrangements/Services Living arrangements for the past 2 months: Single Family Home Lives with:: Spouse Patient language and need for interpreter reviewed:: Yes Do you feel safe going back to the place where you live?: Yes      Need for Family Participation in Patient Care: Yes (Comment) Care giver support system in place?: Yes (comment) Current home services: DME Criminal Activity/Legal Involvement Pertinent to Current Situation/Hospitalization: No - Comment as needed  Activities of Daily Living Home Assistive Devices/Equipment: Cane (specify quad or  straight), Walker (specify type) ADL Screening (condition at time of admission) Patient's cognitive ability adequate to safely complete daily activities?: Yes Is the patient deaf or have difficulty hearing?: No Does the patient have difficulty seeing, even when wearing glasses/contacts?: No Does the patient have difficulty concentrating, remembering, or making decisions?: No Patient able to express need for assistance with ADLs?: Yes Does the patient have difficulty dressing or bathing?: No Independently performs ADLs?: Yes (appropriate for developmental age) Does the patient have difficulty walking or climbing stairs?: Yes Weakness of Legs: Left Weakness of Arms/Hands: None  Permission Sought/Granted   Permission granted to share information with : Yes, Verbal Permission Granted     Permission granted to share info w AGENCY: [facility] [phone]        Emotional Assessment Appearance:: Appears stated age Attitude/Demeanor/Rapport: Engaged Affect (typically observed): Accepting Orientation: : Oriented to Self, Oriented to Place, Oriented to  Time, Oriented to Situation Alcohol / Substance Use: Not Applicable Psych Involvement: No (comment)  Admission diagnosis:  Back pain [M54.9] Lumbar spinal stenosis [M48.061] Thoracic myelopathy [M47.14] Patient Active Problem List   Diagnosis Date Noted  . Lumbar spinal stenosis 06/01/2020  . Spinal cord compression ([place]) 04/02/2020  . Thoracic myelopathy 07/25/2018  . Hyperlipidemia associated with type 2 diabetes mellitus ([place]) 11/14/2016  . Type 2 diabetes mellitus with diabetic neuropathy, unspecified ([place]) 11/14/2016  . Routine  general medical examination at a health care facility 11/14/2015  . Hiatal hernia 08/30/2014  . Allergic rhinitis 04/10/2013  . Spinal stenosis of lumbar region 11/06/2012  . Allergic rhinitis due to pollen 01/15/2011  . OSTEOARTHRITIS 11/02/2008  . HIP PAIN, LEFT 02/09/2008  .  Morbid obesity ([place]) 11/07/2007  . ERECTILE DYSFUNCTION, MILD 11/07/2007  . Essential hypertension 11/07/2007   PCP:  [name], [name], MD Pharmacy:   [id number] - [place], [place] [address] Phone: [phone] Fax: [phone]     Social Determinants of Health (SDOH) Interventions    Readmission Risk Interventions No flowsheet data found.

## 2020-06-02 NOTE — Progress Notes (Signed)
Occupational Therapy Evaluation  Pt close to baseline regarding ADL tasks. Difficulty with LB ADL however pt has AE to assist after DC. No DME needs.  Reviewed compensatory strategies to decrease pain and increase independence during ADL. Handout provided and reviewed. Pt plans to continue with outpt PT after DC. Pt very appreciative. OT signing off.     06/02/20 1400  OT Visit Information  Last OT Received On 06/02/20  Assistance Needed +1  History of Present Illness 69 yo male with onset of worsening back pain and notation of spinal cord compression in June 2021 was admitted to [facility].  Pt has hx of thoracic spinal stenosis with myelopathy and T11-12 laminectomy, chronic back pain afterward.  Now is having numbness and tingling on LE's, ant thigh to feet on anterior legs.  PMHx:  DM, PN, spinal stenosis on lumbar spine, OA L hip, duodenal ulcer, HTN, scoliosis, UTI  Precautions  Precautions Fall  Precaution Comments back precautions for pt comfort  Home Living  Family/patient expects to be discharged to: Private residence  Living Arrangements Spouse/significant other  Available Help at Discharge Family;Available 24 hours/day  Type of Home House  Home Access Stairs to enter  Entrance Stairs-Number of Steps 2  Entrance Stairs-Rails Right  Home Layout Two level  Alternate Level Stairs-Number of Steps 13  Alternate Level Stairs-Rails Right  Bathroom Shower/Tub Walk-in shower  Bathroom Toilet Handicapped height  Bathroom Accessibility Yes  How Accessible Accessible via walker  Deuel - 4 wheels;Cane - single point;Grab bars - toilet;Grab bars - Music therapist Reacher;Sock aid;Long-handled shoe horn;Long-handled sponge  Prior Function  Level of Independence Independent with assistive device(s)  Pain Assessment  Pain Assessment 0-10  Pain Score 4  Pain Location back   Pain Descriptors / Indicators Discomfort  Pain Intervention(s)  Limited activity within patient's tolerance  Cognition  Arousal/Alertness Awake/alert  Behavior During Therapy WFL for tasks assessed/performed  Overall Cognitive Status Within Functional Limits for tasks assessed  Upper Extremity Assessment  Upper Extremity Assessment Overall WFL for tasks assessed  Lower Extremity Assessment  Lower Extremity Assessment Defer to PT evaluation  Cervical / Trunk Assessment  Cervical / Trunk Assessment Other exceptions  Cervical / Trunk Exceptions previous spinal surgery  ADL  Overall ADL's  Needs assistance/impaired  Functional mobility during ADLs Modified independent;Rolling walker  General ADL Comments Reviewed back precautions wtih pt to increase comfort during ADL tasks. REcommend use of AE to assist with LB ADL as pt is unable to complete figure four position. Wife able to help as needed  Bed Mobility  General bed mobility comments pt OOB in recliner  Transfers  Overall transfer level Modified independent  OT - End of Session  Activity Tolerance Patient tolerated treatment well  Patient left in chair;with call bell/phone within reach;with family/visitor present  Nurse Communication Mobility status  OT Assessment  OT Recommendation/Assessment Patient does not need any further OT services  OT Visit Diagnosis Other abnormalities of gait and mobility (R26.89);Pain  Pain - part of body  (back)  OT Problem List Decreased knowledge of use of DME or AE;Pain;Obesity  AM-PAC OT "6 Clicks" Daily Activity Outcome Measure (Version 2)  Help from another person eating meals? 4  Help from another person taking care of personal grooming? 4  Help from another person toileting, which includes using toliet, bedpan, or urinal? 4  Help from another person bathing (including washing, rinsing, drying)? 3  Help from another person to put on and taking  off regular upper body clothing? 4  Help from another person to put on and taking off regular lower body clothing? 3   6 Click Score 22  OT Recommendation  Follow Up Recommendations No OT follow up;Supervision - Intermittent  OT Equipment None recommended by OT  Acute Rehab OT Goals  Patient Stated Goal to lose weight and get back into PT  OT Goal Formulation All assessment and education complete, DC therapy  OT Time Calculation  OT Start Time (ACUTE ONLY) 1400  OT Stop Time (ACUTE ONLY) 1420  OT Time Calculation (min) 20 min  OT General Charges  $OT Visit 1 Visit  OT Evaluation  $OT Eval Low Complexity 1 Low  Alexxis Mackert, OT/L   Acute OT Clinical Specialist [facility] Pager [phone] Office [phone]

## 2020-06-02 NOTE — Progress Notes (Signed)
Physical Therapy Treatment Patient Details Name: Keith [name] MRN: [id number] DOB: 04-Aug-1951 Today's Date: 06/02/2020    History of Present Illness 69 yo male with onset of worsening back pain and notation of spinal cord compression in June 2021 was admitted to [facility].  Pt has hx of thoracic spinal stenosis with myelopathy and T11-12 laminectomy, chronic back pain afterward.  Now is having numbness and tingling on LE's, ant thigh to feet on anterior legs.  PMHx:  DM, PN, spinal stenosis on lumbar spine, OA L hip, duodenal ulcer, HTN, scoliosis, UTI    PT Comments    The pt is continuing to make good progress with functional mobility and gait at this time. He was able to significantly improve gait distance and stability today with use of a RW, but did have one minor LOB when attempting ambulation without use of AD. The pt was also educated in a series of exercises for general LE strengthening and hip stability to further reduce risk of falls. The pt was agreeable to all education, and very motivated to return to [facility] following d/c as he experienced significant reduction in pain and improvement in functional mobility last time he was attending PT. The pt will continue to benefit from skilled PT to address deficits described below, but is safe to return home with assist of his wife and use of RW for mobility.    Follow Up Recommendations  Outpatient PT;Supervision for mobility/OOB     Equipment Recommendations  Rolling walker with 5" wheels    Recommendations for Other Services       Precautions / Restrictions Precautions Precautions: Fall Precaution Comments: back precautions for pt comfort Restrictions Weight Bearing Restrictions: No    Mobility  Bed Mobility Overal bed mobility: Modified Independent             General bed mobility comments: pt OOB in recliner  Transfers Overall transfer level: Needs assistance Equipment used: Rolling walker (2 wheeled) Transfers: Sit  to/from Stand Sit to Stand: Supervision         General transfer comment: good power up with BUE, pt able to steady with RW and increased time, some bracing of BLE on bed/chair  Ambulation/Gait Ambulation/Gait assistance: Min guard;Min assist Gait Distance (Feet): 200 Feet Assistive device: Rolling walker (2 wheeled) Gait Pattern/deviations: Step-through pattern;Step-to pattern;Wide base of support;Shuffle;Decreased stride length;Decreased dorsiflexion - left Gait velocity: 0.17 m/s Gait velocity interpretation: <1.31 ft/sec, indicative of household ambulator General Gait Details: LLE with poor clearance, slightly reduced DF in LLE. one LOB while gensturing with BUE while walking (pt let go of RW), but good stability with RW   Stairs             Wheelchair Mobility    Modified Rankin (Stroke Patients Only)       Balance Overall balance assessment: Needs assistance Sitting-balance support: Feet supported Sitting balance-Leahy Scale: Good     Standing balance support: Bilateral upper extremity supported;During functional activity Standing balance-Leahy Scale: Fair Standing balance comment: one LOB without UE support, improved with BUE support                            Cognition Arousal/Alertness: Awake/alert Behavior During Therapy: WFL for tasks assessed/performed Overall Cognitive Status: Within Functional Limits for tasks assessed  Exercises General Exercises - Lower Extremity Long Arc Quad: AROM;Both;10 reps;Seated Hip ABduction/ADduction: AROM;Both;10 reps;Standing (and standing hip extension) Mini-Sqauts: Strengthening;10 reps;Standing Other Exercises Other Exercises: standing HS curl x10    General Comments General comments (skin integrity, edema, etc.): Pt with improved stability with gait and mobility. Still limited with backwards mobility frequently requiring minA to recover or UE  support on a sturdy surface. Pt educated about safety with mobility.      Pertinent Vitals/Pain Pain Assessment: 0-10 Pain Score: 5  Pain Location: back  Pain Descriptors / Indicators: Grimacing;Guarding Pain Intervention(s): Limited activity within patient's tolerance;Monitored during session           PT Goals (current goals can now be found in the care plan section) Acute Rehab PT Goals Patient Stated Goal: to be safer and get home PT Goal Formulation: With patient Time For Goal Achievement: 06/15/20 Potential to Achieve Goals: Good Progress towards PT goals: Progressing toward goals    Frequency    Min 3X/week      PT Plan Discharge plan needs to be updated       AM-PAC PT "6 Clicks" Mobility   Outcome Measure  Help needed turning from your back to your side while in a flat bed without using bedrails?: None Help needed moving from lying on your back to sitting on the side of a flat bed without using bedrails?: None Help needed moving to and from a bed to a chair (including a wheelchair)?: A Little Help needed standing up from a chair using your arms (e.g., wheelchair or bedside chair)?: A Little Help needed to walk in hospital room?: A Little Help needed climbing 3-5 steps with a railing? : A Little 6 Click Score: 20    End of Session Equipment Utilized During Treatment: Gait belt Activity Tolerance: Patient tolerated treatment well Patient left: in chair;with call bell/phone within reach Nurse Communication: Mobility status PT Visit Diagnosis: Unsteadiness on feet (R26.81);Muscle weakness (generalized) (M62.81);Difficulty in walking, not elsewhere classified (R26.2);Pain Pain - part of body:  (back)     Time: 1002-1040 PT Time Calculation (min) (ACUTE ONLY): 38 min  Charges:  $Gait Training: 23-37 mins $Therapeutic Exercise: 8-22 mins                     Karma Ganja, PT, DPT   Acute Rehabilitation Department Pager #: [phone]   Otho Bellows 06/02/2020, 12:51 PM

## 2020-06-02 NOTE — Progress Notes (Signed)
  NEUROSURGERY PROGRESS NOTE   No issues overnight. Pain improved from 9/10 to 5/10. LLE weakness improved. N/T is much improved and now intermittent.  No issues with voiding. Per patient "I am leaning towards surgery"  EXAM:  BP 129/82 (BP Location: Right Arm)   Pulse 64   Temp 98.2 F (36.8 C) (Oral)   Resp 16   SpO2 100%   Awake, alert, oriented  Speech fluent, appropriate Ambulating without walker when I walked in  CN grossly intact  5/5 BUE/RLE, mild weakness LLE with IP and quad.   IMPRESSION/PLAN 69 y.o. male admitted for worsening back pain, BLE N/T and LLE weakness in the setting of thoracic and lumbar spinal stenosis. He is much improved compared to yesterday, but still believes he would benefit from surgery. With his already drastic improvement with 24 hours of steroids, I do not think we should be leaning towards surgery at this point. I communicated this with patient who states understanding. Will continue steroids and PT/OT. PT did rec CIR yesterday, but I am hopeful he can be discharged home.

## 2020-06-03 NOTE — Progress Notes (Signed)
  NEUROSURGERY PROGRESS NOTE   No issues overnight.  No changes in pain No concerns this am  EXAM:  BP 136/68 (BP Location: Right Arm)   Pulse 67   Temp (!) 97.4 F (36.3 C) (Oral)   Resp 16   SpO2 100%   Awake, alert, oriented  Speech fluent, appropriate  CN grossly intact  5/5 BUE/BLE, mild LLE weakness proximally  IMPRESSION/PLAN 69 y.o. male admitted for worsening back pain, BLE N/T and LLE weakness in the setting of thoracic and lumbar spinal stenosis. Pain and N/T much improved compared to admission but has plateaued. He was able to work with therapy yesterday and has much improved mobility.  I strongly feel as though patient should be discharged today, but he refuses to leave. He has multiple reasons why he thinks he needs to stay ("Medicare screwed me and my rehab which is why I am here", "I served for 41 years for [place] then got put on the shelf", "COVID will prevent me from having surgery if I need it in the future"). Despite trying my best to educate the patient, he is still insistent on staying in the hospital one more day. I told patient that tomorrow he will be discharged no matter what.

## 2020-06-03 NOTE — Progress Notes (Signed)
Physical Therapy Treatment Patient Details Name: Keith [name] MRN: [id number] DOB: 15-Nov-1950 Today's Date: 06/03/2020    History of Present Illness 69 yo male with onset of worsening back pain and notation of spinal cord compression in June 2021 was admitted to [facility].  Pt has hx of thoracic spinal stenosis with myelopathy and T11-12 laminectomy, chronic back pain afterward.  Now is having numbness and tingling on LE's, ant thigh to feet on anterior legs.  PMHx:  DM, PN, spinal stenosis on lumbar spine, OA L hip, duodenal ulcer, HTN, scoliosis, UTI    PT Comments    The pt is continuing to make good progress with therapy, as he was able to progress ambulation distance as well as complete stair training with minA in preparation for d/c home. The pt was able to complete all mobility with minG for safety, but no LOB when using RW. The pt is optimistic that with continued OPPT, he will be able to progress to use of cane rather than RW, and is completing the given HEP each day outside of PT session to achieve this goal. The pt will continue to benefit from skilled PT to further progress functional strengthening and stability in his BLE to improve endurance, safety with mobility, and independence following d/c home.     Follow Up Recommendations  Outpatient PT;Supervision for mobility/OOB     Equipment Recommendations  Rolling walker with 5" wheels    Recommendations for Other Services       Precautions / Restrictions Precautions Precautions: Fall Precaution Comments: back precautions for pt comfort Restrictions Weight Bearing Restrictions: No    Mobility  Bed Mobility Overal bed mobility: Modified Independent             General bed mobility comments: pt OOB in recliner  Transfers Overall transfer level: Modified independent Equipment used: Rolling walker (2 wheeled) Transfers: Sit to/from Stand Sit to Stand: Supervision         General transfer comment: good power up  with BUE, pt able to steady with RW and increased time, some bracing of BLE on bed/chair  Ambulation/Gait Ambulation/Gait assistance: Min guard;Min assist Gait Distance (Feet): 250 Feet Assistive device: Rolling walker (2 wheeled) Gait Pattern/deviations: Step-through pattern;Step-to pattern;Wide base of support;Shuffle;Decreased stride length;Decreased dorsiflexion - left Gait velocity: 0.41 m/s Gait velocity interpretation: 1.31 - 2.62 ft/sec, indicative of limited community ambulator General Gait Details: alternating between step-through and step-to with LLE through gait depending on fatigue and cues. Pt able to improve gait pattern when cued, but heel-toe pattern with good stride length dissolves with fatigue. No LOB today, the pt was able to increase distance and gait speed   Stairs Stairs: Yes Stairs assistance: Min assist Stair Management: One rail Right;Forwards;Step to pattern Number of Stairs: 5 General stair comments: pt able to complete 5 total steps with step-to pattern with RLE leading up and LLE leading down. minA to LUE to complete  stairs, no LOB or evidence of instability with UE support      Balance Overall balance assessment: Needs assistance Sitting-balance support: Feet supported Sitting balance-Leahy Scale: Good     Standing balance support: Bilateral upper extremity supported;During functional activity Standing balance-Leahy Scale: Fair Standing balance comment: pt reliant on BUE support                            Cognition Arousal/Alertness: Awake/alert Behavior During Therapy: WFL for tasks assessed/performed Overall Cognitive Status: Within Functional Limits for  tasks assessed                                        Exercises General Exercises - Lower Extremity Hip Flexion/Marching: AROM;Both;15 reps;Seated    General Comments General comments (skin integrity, edema, etc.): Pt with improved stability and ambulation  today, able to complete stair training, no LOB with ambulation. Pt educated about additions to HEP and energy conservation to improve safety with mobility at home      Pertinent Vitals/Pain Pain Assessment: 0-10 Pain Score: 5  Pain Location: back  Pain Descriptors / Indicators: Discomfort Pain Intervention(s): Monitored during session;Repositioned;Limited activity within patient's tolerance           PT Goals (current goals can now be found in the care plan section) Acute Rehab PT Goals Patient Stated Goal: to lose weight and get back into PT PT Goal Formulation: With patient Time For Goal Achievement: 06/15/20 Potential to Achieve Goals: Good Progress towards PT goals: Progressing toward goals    Frequency    Min 3X/week      PT Plan Current plan remains appropriate       AM-PAC PT "6 Clicks" Mobility   Outcome Measure  Help needed turning from your back to your side while in a flat bed without using bedrails?: None Help needed moving from lying on your back to sitting on the side of a flat bed without using bedrails?: None Help needed moving to and from a bed to a chair (including a wheelchair)?: A Little Help needed standing up from a chair using your arms (e.g., wheelchair or bedside chair)?: A Little Help needed to walk in hospital room?: A Little Help needed climbing 3-5 steps with a railing? : A Little 6 Click Score: 20    End of Session Equipment Utilized During Treatment: Gait belt Activity Tolerance: Patient tolerated treatment well Patient left: in chair;with call bell/phone within reach Nurse Communication: Mobility status PT Visit Diagnosis: Unsteadiness on feet (R26.81);Muscle weakness (generalized) (M62.81);Difficulty in walking, not elsewhere classified (R26.2);Pain Pain - part of body:  (back)     Time: 4076-8088 PT Time Calculation (min) (ACUTE ONLY): 21 min  Charges:  $Gait Training: 8-22 mins                    Karma Ganja, PT, DPT   Acute  Rehabilitation Department Pager #: [phone]   Otho Bellows 06/03/2020, 12:25 PM

## 2020-06-04 MED ORDER — OXYCODONE HCL 5 MG PO TABS: 5.0000 mg | ORAL_TABLET | Freq: Four times a day (QID) | ORAL | 0 refills | Status: DC | PRN

## 2020-06-04 MED ORDER — DOCUSATE SODIUM 100 MG PO CAPS: 100.0000 mg | ORAL_CAPSULE | Freq: Two times a day (BID) | ORAL | 0 refills | Status: DC

## 2020-06-04 MED ORDER — CYCLOBENZAPRINE HCL 10 MG PO TABS: 10.0000 mg | ORAL_TABLET | Freq: Three times a day (TID) | ORAL | 0 refills | Status: DC | PRN

## 2020-06-04 MED ORDER — METHYLPREDNISOLONE 4 MG PO TBPK: ORAL_TABLET | ORAL | 0 refills | Status: DC

## 2020-06-04 NOTE — Discharge Summary (Signed)
Physician Discharge Summary     Providing Compassionate, Quality Care - Together   Patient ID: Keith [name] MRN: [id number] DOB/AGE: 1951/04/10 69 y.o.  Admit date: 05/31/2020 Discharge date: 06/04/2020  Admission Diagnoses: Lumbar spinal stenosis  Discharge Diagnoses:  Active Problems:   Lumbar spinal stenosis   Discharged Condition: good  Hospital Course: Patient was admitted to 5C15 from the emergency department on 06/01/2020 due to acute on chronic low back pain and worsening numbness, tingling, and weakness in bilateral lower extremities. He was started on IV steroids and began working with therapies. He improved tremendously throughout the course of this admission and is ready to discharge home with outpatient therapies. He will follow up in the office with Dr. Zada Finders in three weeks.  Consults: rehabilitation medicine  Significant Diagnostic Studies: radiology: MR THORACIC SPINE W WO CONTRAST  Result Date: 06/01/2020 CLINICAL DATA:  Acute or progressive myelopathy EXAM: MRI THORACIC WITHOUT AND WITH CONTRAST TECHNIQUE: Multiplanar and multiecho pulse sequences of the thoracic spine were obtained without and with intravenous contrast. CONTRAST:  20mL GADAVIST GADOBUTROL 1 MMOL/ML IV SOLN COMPARISON:  07/25/2018 FINDINGS: MRI THORACIC SPINE FINDINGS Alignment: Scoliosis primarily affecting the lumbar spine. Kyphotic deformity at the lower cervical spine with C4-5 and C5-6 anterolisthesis. Cervical spine is only seen on sagittal T1 counting sequence. Vertebrae: No evidence of fracture or discitis. Cord:  T2 hyperintensity at the level of cord compression at T11-12. Paraspinal and other soft tissues: Scarring at lower thoracic to lumbar levels. No evident collection. Mild paraspinal inflammation associated with the severely degenerated left T11-12 facet. Disc levels: Dominant findings at T11-12 where there is disc collapse and endplate degeneration with circumferential bulging and  ridging. Facet osteoarthritis with marked spurring and asymmetric left gaping joint with effusion. There is cord compression with T2 signal and severe biforaminal impingement. Spinal stenosis is despite interval laminectomy. Bridging osteophytes at T8 to T11 with asymmetric left-sided foraminal narrowing especially at T10-11. The disc spaces are diffusely narrowed and bulging with endplate spurs. Facet osteoarthritis asymmetric to the right in the midthoracic spine with noncompressive right foraminal narrowings at T5-6 and T6-7. Laminectomy has been performed at T11 and below. IMPRESSION: 1. T11-12 cord compression with myelopathic findings despite an interval laminectomy. The residual posterior arch is hypertrophic, especially the severely degenerated facets, and combines with disc bulging to compress the cord. Given the degree of left facet joint gaping and effusion, suspect abnormal motion. Severe biforaminal impingement is also seen at this level. 2. T8-T11 ankylosis which presumably accentuates the above. Chronic left T10-11 foraminal stenosis from hypertrophic bone. 3. Additional noncompressive degenerative findings are described above. Electronically Signed   By: Monte Fantasia M.D.   On: 06/01/2020 04:56   MR Lumbar Spine W Wo Contrast  Result Date: 06/01/2020 CLINICAL DATA:  Acute or progressive myelopathy. Back pain that is worse since a K shin last week. Tingling down both legs EXAM: MRI LUMBAR SPINE WITHOUT AND WITH CONTRAST TECHNIQUE: Multiplanar and multiecho pulse sequences of the lumbar spine were obtained without and with intravenous contrast. CONTRAST:  21mL GADAVIST GADOBUTROL 1 MMOL/ML IV SOLN COMPARISON:  07/25/2018 FINDINGS: Segmentation:  5 lumbar type vertebrae when numbered from above Alignment:  Prominent levoscoliosis.  L4-5 grade 1 anterolisthesis. Vertebrae: Mild discogenic endplate edema at V78-46. No acute fracture or discitis. No aggressive bone lesion Conus medullaris and cauda  equina: Conus extends to the L1 level. Conus and cauda equina appear normal. Paraspinal and other soft tissues: Scarring related to laminectomy the thoracolumbar junction.  More inferior scarring from smaller decompressions previously. Disc levels: Lower thoracic spine is described on dedicated study separately. T12- L1: Disc narrowing and bulging with small central protrusion. Degenerative facet spurring on both sides. L1-L2: Disc narrowing with right-sided collapse and asymmetric bulging/ridging. Asymmetric right facet spurring. Moderate right foraminal and subarticular recess stenosis L2-L3: Disc narrowing asymmetric to the right where there is greater bulging and ridging. Asymmetric right facet osteoarthritis. Moderate right foraminal stenosis. Mild spinal stenosis L3-L4: Disc collapse asymmetric to the right. There is endplate ridging and bulging. Severe facet osteoarthritis with asymmetric right spurring and joint distortion. Severe spinal stenosis. Compressive biforaminal narrowing. L4-L5: Severe facet osteoarthritis with spurring on the left more than right and anterolisthesis. The disc is narrowed and bulging with extensive protrusion. Moderate right and advanced left foraminal stenosis. High-grade spinal stenosis with especially severe effacement of the left subarticular recess L5-S1:Asymmetric left facet spurring and endplate degeneration with left foraminal impingement especially at the entry to the foramen. The canal is patent. IMPRESSION: 1. Severe and diffuse degenerative disease with levoscoliosis. 2. L3-4 severe spinal stenosis. 3. L4-5 high-grade spinal stenosis, severe at the left subarticular recess. 4. Multilevel foraminal impingement as described. Electronically Signed   By: Monte Fantasia M.D.   On: 06/01/2020 05:02   DG Lumb Spine Flex&Ext Only  Result Date: 06/01/2020 CLINICAL DATA:  Ongoing back pain EXAM: LUMBAR SPINE FLEX AND EXTEND ONLY - 2-3 VIEW COMPARISON:  MRI from earlier today  FINDINGS: No detected listhesis at T10-11 during flexion and extension. There is severe lumbar spine degeneration with scoliosis and L4-5 anterolisthesis. IMPRESSION: No abnormal motion detected at T11-12. Electronically Signed   By: Monte Fantasia M.D.   On: 06/01/2020 07:52   DG SCOLIOSIS EVAL COMPLETE SPINE 4 OR 5 VIEWS  Result Date: 06/01/2020 CLINICAL DATA:  Back pain with lower extremity numbness/tingling EXAM: DG SCOLIOSIS EVAL COMPLETE SPINE 4-5V COMPARISON:  Same-day MRI FINDINGS: Twelve rib-bearing thoracic type vertebral segments and 5 non rib-bearing lumbar type vertebral segments. No evidence of vertebral segmentation anomaly. Thoracic dextrocurvature of 41 degrees as measured from the superior endplate of T7 to the inferior endplate of V61. Lumbar levocurvature of 61 degrees as measured from the superior endplate of Y07 to the inferior endplate of L4. Multilevel thoracolumbar spondylosis, as detailed on dedicated MRI performed same day. Superior right pelvic tilt. Heart size is normal. Lungs are clear. Bowel gas pattern is nonobstructive. IMPRESSION: Thoracolumbar scoliosis, as above. Electronically Signed   By: Davina Poke D.O.   On: 06/01/2020 08:03     Treatments: steroids: IV decadron  Discharge Exam: Blood pressure (!) 142/86, pulse 62, temperature 98 F (36.7 C), temperature source Oral, resp. rate 17, height 6' (1.829 m), weight 116.6 kg, SpO2 100 %.   Alert and oriented x 4 PERRLA CN II-XII grossly intact MAE, Strength and sensation intact   Disposition: Discharge disposition: 01-Home or Self Care       Discharge Instructions    Face-to-face encounter (required for Medicare/Medicaid patients)   Complete by: As directed    I Patricia Nettle certify that this patient is under my care and that I, or a nurse practitioner or physician's assistant working with me, had a face-to-face encounter that meets the physician face-to-face encounter requirements with this  patient on 06/04/2020. The encounter with the patient was in whole, or in part for the following medical condition(s) which is the primary reason for home health care (List medical condition): Thoracic and lumbar spinal stenosis  The encounter with the patient was in whole, or in part, for the following medical condition, which is the primary reason for home health care: Thoracic and lumbar spinal stenosis   I certify that, based on my findings, the following services are medically necessary home health services: Physical therapy   Reason for Medically Necessary Home Health Services: Therapy- Therapeutic Exercises to Increase Strength and Endurance   My clinical findings support the need for the above services: Unable to leave home safely without assistance and/or assistive device   Further, I certify that my clinical findings support that this patient is homebound due to: Unable to leave home safely without assistance   Home Health   Complete by: As directed    To provide the following care/treatments:  PT OT       Allergies as of 06/04/2020      Reactions   Lisinopril Other (See Comments)   Body Aches      Medication List    TAKE these medications   atenolol-chlorthalidone 50-25 MG tablet Commonly known as: TENORETIC Take 1 tablet by mouth daily. Pt needs appt for further refills.   cloNIDine 0.1 MG tablet Commonly known as: CATAPRES Take 1 tablet (0.1 mg total) by mouth daily.   cyclobenzaprine 10 MG tablet Commonly known as: FLEXERIL Take 1 tablet (10 mg total) by mouth 3 (three) times daily as needed for muscle spasms.   docusate sodium 100 MG capsule Commonly known as: COLACE Take 1 capsule (100 mg total) by mouth 2 (two) times daily.   DULoxetine 30 MG capsule Commonly known as: CYMBALTA Take 60 mg by mouth daily.   metFORMIN 500 MG tablet Commonly known as: GLUCOPHAGE Take 0.5 tablets (250 mg total) by mouth daily with breakfast.   methylPREDNISolone 4 MG Tbpk  tablet Commonly known as: MEDROL DOSEPAK Take as directed on package insert.   multivitamin with minerals Tabs tablet Take 1 tablet by mouth daily.   OxyCONTIN 10 mg 12 hr tablet Generic drug: oxyCODONE Take 10 mg by mouth 3 (three) times daily. What changed: Another medication with the same name was added. Make sure you understand how and when to take each.   oxyCODONE 5 MG immediate release tablet Commonly known as: Oxy IR/ROXICODONE Take 1-2 tablets (5-10 mg total) by mouth every 6 (six) hours as needed for severe pain ((score 7 to 10)). What changed: You were already taking a medication with the same name, and this prescription was added. Make sure you understand how and when to take each.   sildenafil 20 MG tablet Commonly known as: REVATIO TAKE 1-2 TABLETS 2-3 HOURS PRIOR TO SEX What changed:   how much to take  how to take this  when to take this  reasons to take this  additional instructions   simvastatin 40 MG tablet Commonly known as: Zocor Take 1 tablet (40 mg total) by mouth daily.       Follow-up Information    Judith Part, MD. Schedule an appointment as soon as possible for a visit in 3 week(s).   Specialty: Neurosurgery Contact information: [place] [address] [phone]               Signed: Patricia Nettle 06/04/2020, 11:17 AM

## 2020-06-04 NOTE — Progress Notes (Signed)
Pt IV removed, catheter intact  Pt discharge education provided at bedside, all questions answered Pt has all belongings  Pt discharged via wheelchair with NT

## 2020-06-08 DIAGNOSIS — I2699 Other pulmonary embolism without acute cor pulmonale: Secondary | ICD-10-CM

## 2020-06-08 HISTORY — DX: Other pulmonary embolism without acute cor pulmonale: I26.99

## 2020-06-26 ENCOUNTER — Emergency Department (HOSPITAL_COMMUNITY): Payer: Medicare Other

## 2020-06-26 ENCOUNTER — Other Ambulatory Visit: Payer: Self-pay

## 2020-06-26 ENCOUNTER — Inpatient Hospital Stay (HOSPITAL_COMMUNITY): Payer: Medicare Other

## 2020-06-26 ENCOUNTER — Inpatient Hospital Stay (HOSPITAL_COMMUNITY)
Admission: EM | Admit: 2020-06-26 | Discharge: 2020-07-04 | DRG: 981 | Disposition: A | Discharge status: Rehabilitation Facility | Payer: Medicare Other | Attending: Internal Medicine | Admitting: Internal Medicine

## 2020-06-26 DIAGNOSIS — E1169 Type 2 diabetes mellitus with other specified complication: Secondary | ICD-10-CM | POA: Diagnosis present

## 2020-06-26 DIAGNOSIS — M62838 Other muscle spasm: Secondary | ICD-10-CM | POA: Diagnosis not present

## 2020-06-26 DIAGNOSIS — Z7984 Long term (current) use of oral hypoglycemic drugs: Secondary | ICD-10-CM | POA: Diagnosis not present

## 2020-06-26 DIAGNOSIS — M1990 Unspecified osteoarthritis, unspecified site: Secondary | ICD-10-CM | POA: Diagnosis present

## 2020-06-26 DIAGNOSIS — Z79899 Other long term (current) drug therapy: Secondary | ICD-10-CM | POA: Diagnosis not present

## 2020-06-26 DIAGNOSIS — J9601 Acute respiratory failure with hypoxia: Secondary | ICD-10-CM | POA: Diagnosis present

## 2020-06-26 DIAGNOSIS — S82841A Displaced bimalleolar fracture of right lower leg, initial encounter for closed fracture: Secondary | ICD-10-CM | POA: Diagnosis not present

## 2020-06-26 DIAGNOSIS — I82442 Acute embolism and thrombosis of left tibial vein: Secondary | ICD-10-CM | POA: Diagnosis present

## 2020-06-26 DIAGNOSIS — S82891A Other fracture of right lower leg, initial encounter for closed fracture: Secondary | ICD-10-CM

## 2020-06-26 DIAGNOSIS — Z833 Family history of diabetes mellitus: Secondary | ICD-10-CM | POA: Diagnosis not present

## 2020-06-26 DIAGNOSIS — Z7901 Long term (current) use of anticoagulants: Secondary | ICD-10-CM | POA: Diagnosis not present

## 2020-06-26 DIAGNOSIS — Z8249 Family history of ischemic heart disease and other diseases of the circulatory system: Secondary | ICD-10-CM | POA: Diagnosis not present

## 2020-06-26 DIAGNOSIS — Y92230 Patient room in hospital as the place of occurrence of the external cause: Secondary | ICD-10-CM | POA: Diagnosis not present

## 2020-06-26 DIAGNOSIS — R579 Shock, unspecified: Secondary | ICD-10-CM

## 2020-06-26 DIAGNOSIS — Z20822 Contact with and (suspected) exposure to covid-19: Secondary | ICD-10-CM | POA: Diagnosis present

## 2020-06-26 DIAGNOSIS — Z87891 Personal history of nicotine dependence: Secondary | ICD-10-CM | POA: Diagnosis not present

## 2020-06-26 DIAGNOSIS — N179 Acute kidney failure, unspecified: Secondary | ICD-10-CM | POA: Diagnosis present

## 2020-06-26 DIAGNOSIS — E1140 Type 2 diabetes mellitus with diabetic neuropathy, unspecified: Secondary | ICD-10-CM | POA: Diagnosis present

## 2020-06-26 DIAGNOSIS — S82851A Displaced trimalleolar fracture of right lower leg, initial encounter for closed fracture: Secondary | ICD-10-CM

## 2020-06-26 DIAGNOSIS — Z9181 History of falling: Secondary | ICD-10-CM

## 2020-06-26 DIAGNOSIS — E279 Disorder of adrenal gland, unspecified: Secondary | ICD-10-CM | POA: Diagnosis present

## 2020-06-26 DIAGNOSIS — W010XXA Fall on same level from slipping, tripping and stumbling without subsequent striking against object, initial encounter: Secondary | ICD-10-CM | POA: Diagnosis not present

## 2020-06-26 DIAGNOSIS — D62 Acute posthemorrhagic anemia: Secondary | ICD-10-CM | POA: Diagnosis not present

## 2020-06-26 DIAGNOSIS — E1142 Type 2 diabetes mellitus with diabetic polyneuropathy: Secondary | ICD-10-CM | POA: Diagnosis present

## 2020-06-26 DIAGNOSIS — I2602 Saddle embolus of pulmonary artery with acute cor pulmonale: Principal | ICD-10-CM

## 2020-06-26 DIAGNOSIS — E785 Hyperlipidemia, unspecified: Secondary | ICD-10-CM | POA: Diagnosis present

## 2020-06-26 DIAGNOSIS — I2692 Saddle embolus of pulmonary artery without acute cor pulmonale: Secondary | ICD-10-CM

## 2020-06-26 DIAGNOSIS — M549 Dorsalgia, unspecified: Secondary | ICD-10-CM | POA: Diagnosis present

## 2020-06-26 DIAGNOSIS — N183 Chronic kidney disease, stage 3 unspecified: Secondary | ICD-10-CM

## 2020-06-26 DIAGNOSIS — R578 Other shock: Secondary | ICD-10-CM | POA: Diagnosis present

## 2020-06-26 DIAGNOSIS — W010XXD Fall on same level from slipping, tripping and stumbling without subsequent striking against object, subsequent encounter: Secondary | ICD-10-CM | POA: Diagnosis present

## 2020-06-26 DIAGNOSIS — E6609 Other obesity due to excess calories: Secondary | ICD-10-CM | POA: Diagnosis present

## 2020-06-26 DIAGNOSIS — N529 Male erectile dysfunction, unspecified: Secondary | ICD-10-CM | POA: Diagnosis present

## 2020-06-26 DIAGNOSIS — G8929 Other chronic pain: Secondary | ICD-10-CM | POA: Diagnosis present

## 2020-06-26 DIAGNOSIS — R7309 Other abnormal glucose: Secondary | ICD-10-CM | POA: Diagnosis not present

## 2020-06-26 DIAGNOSIS — Z888 Allergy status to other drugs, medicaments and biological substances status: Secondary | ICD-10-CM

## 2020-06-26 DIAGNOSIS — R5381 Other malaise: Secondary | ICD-10-CM | POA: Diagnosis present

## 2020-06-26 DIAGNOSIS — Z825 Family history of asthma and other chronic lower respiratory diseases: Secondary | ICD-10-CM | POA: Diagnosis not present

## 2020-06-26 DIAGNOSIS — I10 Essential (primary) hypertension: Secondary | ICD-10-CM | POA: Diagnosis present

## 2020-06-26 DIAGNOSIS — Z86718 Personal history of other venous thrombosis and embolism: Secondary | ICD-10-CM | POA: Diagnosis not present

## 2020-06-26 DIAGNOSIS — E876 Hypokalemia: Secondary | ICD-10-CM | POA: Diagnosis present

## 2020-06-26 DIAGNOSIS — M48061 Spinal stenosis, lumbar region without neurogenic claudication: Secondary | ICD-10-CM | POA: Diagnosis present

## 2020-06-26 DIAGNOSIS — I82432 Acute embolism and thrombosis of left popliteal vein: Secondary | ICD-10-CM | POA: Diagnosis present

## 2020-06-26 DIAGNOSIS — Z8711 Personal history of peptic ulcer disease: Secondary | ICD-10-CM

## 2020-06-26 DIAGNOSIS — G8918 Other acute postprocedural pain: Secondary | ICD-10-CM | POA: Diagnosis not present

## 2020-06-26 DIAGNOSIS — I248 Other forms of acute ischemic heart disease: Secondary | ICD-10-CM | POA: Diagnosis present

## 2020-06-26 DIAGNOSIS — K5903 Drug induced constipation: Secondary | ICD-10-CM | POA: Diagnosis present

## 2020-06-26 DIAGNOSIS — Z86711 Personal history of pulmonary embolism: Secondary | ICD-10-CM | POA: Diagnosis not present

## 2020-06-26 DIAGNOSIS — T380X5A Adverse effect of glucocorticoids and synthetic analogues, initial encounter: Secondary | ICD-10-CM | POA: Diagnosis present

## 2020-06-26 DIAGNOSIS — S82851D Displaced trimalleolar fracture of right lower leg, subsequent encounter for closed fracture with routine healing: Secondary | ICD-10-CM | POA: Diagnosis not present

## 2020-06-26 DIAGNOSIS — G894 Chronic pain syndrome: Secondary | ICD-10-CM | POA: Diagnosis present

## 2020-06-26 DIAGNOSIS — Z79891 Long term (current) use of opiate analgesic: Secondary | ICD-10-CM

## 2020-06-26 DIAGNOSIS — Z6836 Body mass index (BMI) 36.0-36.9, adult: Secondary | ICD-10-CM | POA: Diagnosis present

## 2020-06-26 DIAGNOSIS — I82452 Acute embolism and thrombosis of left peroneal vein: Secondary | ICD-10-CM | POA: Diagnosis present

## 2020-06-26 DIAGNOSIS — I2699 Other pulmonary embolism without acute cor pulmonale: Secondary | ICD-10-CM | POA: Diagnosis not present

## 2020-06-26 DIAGNOSIS — J9611 Chronic respiratory failure with hypoxia: Secondary | ICD-10-CM

## 2020-06-26 DIAGNOSIS — R52 Pain, unspecified: Secondary | ICD-10-CM

## 2020-06-26 DIAGNOSIS — E871 Hypo-osmolality and hyponatremia: Secondary | ICD-10-CM | POA: Diagnosis present

## 2020-06-26 DIAGNOSIS — S82891D Other fracture of right lower leg, subsequent encounter for closed fracture with routine healing: Secondary | ICD-10-CM | POA: Diagnosis not present

## 2020-06-26 DIAGNOSIS — Z87440 Personal history of urinary (tract) infections: Secondary | ICD-10-CM

## 2020-06-26 DIAGNOSIS — M4804 Spinal stenosis, thoracic region: Secondary | ICD-10-CM | POA: Diagnosis present

## 2020-06-26 DIAGNOSIS — Z823 Family history of stroke: Secondary | ICD-10-CM | POA: Diagnosis not present

## 2020-06-26 LAB — COMPREHENSIVE METABOLIC PANEL
ALT: 56 U/L — ABNORMAL HIGH (ref 0–44)
AST: 98 U/L — ABNORMAL HIGH (ref 15–41)
Albumin: 3.5 g/dL (ref 3.5–5.0)
Alkaline Phosphatase: 88 U/L (ref 38–126)
Anion gap: 18 — ABNORMAL HIGH (ref 5–15)
BUN: 24 mg/dL — ABNORMAL HIGH (ref 8–23)
CO2: 18 mmol/L — ABNORMAL LOW (ref 22–32)
Calcium: 9.9 mg/dL (ref 8.9–10.3)
Chloride: 105 mmol/L (ref 98–111)
Creatinine, Ser: 2 mg/dL — ABNORMAL HIGH (ref 0.61–1.24)
GFR calc Af Amer: 39 mL/min — ABNORMAL LOW (ref >60)
GFR calc non Af Amer: 34 mL/min — ABNORMAL LOW (ref >60)
Glucose, Bld: 328 mg/dL — ABNORMAL HIGH (ref 70–99)
Potassium: 3.7 mmol/L (ref 3.5–5.1)
Sodium: 141 mmol/L (ref 135–145)
Total Bilirubin: 0.8 mg/dL (ref 0.3–1.2)
Total Protein: 7.7 g/dL (ref 6.5–8.1)

## 2020-06-26 LAB — CBC WITH DIFFERENTIAL/PLATELET
Abs Immature Granulocytes: 0.02 10*3/uL (ref 0.00–0.07)
Basophils Absolute: 0 10*3/uL (ref 0.0–0.1)
Basophils Relative: 0 %
Eosinophils Absolute: 0.2 10*3/uL (ref 0.0–0.5)
Eosinophils Relative: 2 %
HCT: 40.7 % (ref 39.0–52.0)
Hemoglobin: 12.2 g/dL — ABNORMAL LOW (ref 13.0–17.0)
Immature Granulocytes: 0 %
Lymphocytes Relative: 34 %
Lymphs Abs: 2.9 10*3/uL (ref 0.7–4.0)
MCH: 30.3 pg (ref 26.0–34.0)
MCHC: 30 g/dL (ref 30.0–36.0)
MCV: 101.2 fL — ABNORMAL HIGH (ref 80.0–100.0)
Monocytes Absolute: 0.9 10*3/uL (ref 0.1–1.0)
Monocytes Relative: 11 %
Neutro Abs: 4.5 10*3/uL (ref 1.7–7.7)
Neutrophils Relative %: 53 %
Platelets: 353 10*3/uL (ref 150–400)
RBC: 4.02 MIL/uL — ABNORMAL LOW (ref 4.22–5.81)
RDW: 15.1 % (ref 11.5–15.5)
WBC: 8.6 10*3/uL (ref 4.0–10.5)
nRBC: 0 % (ref 0.0–0.2)

## 2020-06-26 LAB — I-STAT CHEM 8, ED
BUN: 27 mg/dL — ABNORMAL HIGH (ref 8–23)
Calcium, Ion: 1.11 mmol/L — ABNORMAL LOW (ref 1.15–1.40)
Chloride: 108 mmol/L (ref 98–111)
Creatinine, Ser: 1.7 mg/dL — ABNORMAL HIGH (ref 0.61–1.24)
Glucose, Bld: 318 mg/dL — ABNORMAL HIGH (ref 70–99)
HCT: 39 % (ref 39.0–52.0)
Hemoglobin: 13.3 g/dL (ref 13.0–17.0)
Potassium: 3.6 mmol/L (ref 3.5–5.1)
Sodium: 142 mmol/L (ref 135–145)
TCO2: 18 mmol/L — ABNORMAL LOW (ref 22–32)

## 2020-06-26 LAB — LACTIC ACID, PLASMA: Lactic Acid, Venous: 2.1 mmol/L (ref 0.5–1.9)

## 2020-06-26 LAB — CBC
HCT: 34.6 % — ABNORMAL LOW (ref 39.0–52.0)
Hemoglobin: 10.6 g/dL — ABNORMAL LOW (ref 13.0–17.0)
MCH: 30.5 pg (ref 26.0–34.0)
MCHC: 30.6 g/dL (ref 30.0–36.0)
MCV: 99.7 fL (ref 80.0–100.0)
Platelets: 355 10*3/uL (ref 150–400)
RBC: 3.47 MIL/uL — ABNORMAL LOW (ref 4.22–5.81)
RDW: 15.2 % (ref 11.5–15.5)
WBC: 13.9 10*3/uL — ABNORMAL HIGH (ref 4.0–10.5)
nRBC: 0 % (ref 0.0–0.2)

## 2020-06-26 LAB — TYPE AND SCREEN
ABO/RH(D): O POS
Antibody Screen: NEGATIVE

## 2020-06-26 LAB — ETHANOL: Alcohol, Ethyl (B): <10 mg/dL (ref <10)

## 2020-06-26 LAB — CORTISOL-AM, BLOOD: Cortisol - AM: 53 ug/dL — ABNORMAL HIGH (ref 6.7–22.6)

## 2020-06-26 LAB — BRAIN NATRIURETIC PEPTIDE: B Natriuretic Peptide: 92.3 pg/mL (ref 0.0–100.0)

## 2020-06-26 LAB — GLUCOSE, CAPILLARY: Glucose-Capillary: 261 mg/dL — ABNORMAL HIGH (ref 70–99)

## 2020-06-26 LAB — TROPONIN I (HIGH SENSITIVITY): Troponin I (High Sensitivity): 45 ng/L — ABNORMAL HIGH (ref <18)

## 2020-06-26 LAB — PROTIME-INR
INR: 1.1 (ref 0.8–1.2)
Prothrombin Time: 14.2 seconds (ref 11.4–15.2)

## 2020-06-26 LAB — HIV ANTIBODY (ROUTINE TESTING W REFLEX): HIV Screen 4th Generation wRfx: NONREACTIVE

## 2020-06-26 LAB — SARS CORONAVIRUS 2 BY RT PCR (HOSPITAL ORDER, PERFORMED IN ~~LOC~~ HOSPITAL LAB): SARS Coronavirus 2: NEGATIVE

## 2020-06-26 MED ORDER — IOHEXOL 350 MG/ML SOLN
100.0000 mL | Freq: Once | INTRAVENOUS | Status: AC | PRN
  Administered 2020-06-26: 100 mL via INTRAVENOUS

## 2020-06-26 MED ORDER — SODIUM CHLORIDE 0.9 % IV SOLN
250.0000 mL | Freq: Once | INTRAVENOUS | Status: AC
  Administered 2020-06-27: 250 mL via INTRAVENOUS

## 2020-06-26 MED ORDER — POLYETHYLENE GLYCOL 3350 17 G PO PACK: 17.0000 g | PACK | Freq: Every day | ORAL | Status: DC | PRN

## 2020-06-26 MED ORDER — ALTEPLASE (PULMONARY EMBOLISM) INFUSION
50.0000 mg | Freq: Once | INTRAVENOUS | Status: DC
  Filled 2020-06-26: qty 50

## 2020-06-26 MED ORDER — INSULIN GLARGINE 100 UNIT/ML ~~LOC~~ SOLN
10.0000 [IU] | Freq: Every day | SUBCUTANEOUS | Status: DC
  Administered 2020-06-28 – 2020-07-03 (×6): 10 [IU] via SUBCUTANEOUS
  Filled 2020-06-26 (×13): qty 0.1

## 2020-06-26 MED ORDER — NOREPINEPHRINE 4 MG/250ML-% IV SOLN
0.0000 ug/min | INTRAVENOUS | Status: DC
  Administered 2020-06-27: 8 ug/min via INTRAVENOUS
  Filled 2020-06-26: qty 250

## 2020-06-26 MED ORDER — SODIUM CHLORIDE 0.9 % IV SOLN
250.0000 mL | Freq: Once | INTRAVENOUS | Status: AC
  Administered 2020-06-26: 250 mL via INTRAVENOUS

## 2020-06-26 MED ORDER — NOREPINEPHRINE 4 MG/250ML-% IV SOLN
INTRAVENOUS | Status: AC
  Administered 2020-06-26: 5 ug/min via INTRAVENOUS
  Filled 2020-06-26: qty 250

## 2020-06-26 MED ORDER — ALTEPLASE (PULMONARY EMBOLISM) INFUSION
50.0000 mg | Freq: Once | INTRAVENOUS | Status: AC
  Administered 2020-06-26: 50 mg via INTRAVENOUS
  Filled 2020-06-26: qty 50

## 2020-06-26 MED ORDER — ALTEPLASE (PULMONARY EMBOLISM) INFUSION: 100.0000 mg | Freq: Once | INTRAVENOUS | Status: DC

## 2020-06-26 MED ORDER — FENTANYL CITRATE (PF) 100 MCG/2ML IJ SOLN: 50.0000 ug | Freq: Once | INTRAMUSCULAR | Status: AC

## 2020-06-26 MED ORDER — SODIUM CHLORIDE 0.9 % IV BOLUS
500.0000 mL | Freq: Once | INTRAVENOUS | Status: AC
  Administered 2020-06-26: 500 mL via INTRAVENOUS

## 2020-06-26 MED ORDER — INSULIN ASPART 100 UNIT/ML ~~LOC~~ SOLN
0.0000 [IU] | SUBCUTANEOUS | Status: DC
  Administered 2020-06-26: 11 [IU] via SUBCUTANEOUS
  Administered 2020-06-27: 4 [IU] via SUBCUTANEOUS

## 2020-06-26 MED ORDER — SODIUM CHLORIDE 0.9 % IV BOLUS
1000.0000 mL | Freq: Once | INTRAVENOUS | Status: AC
  Administered 2020-06-26: 1000 mL via INTRAVENOUS

## 2020-06-26 MED ORDER — DOCUSATE SODIUM 100 MG PO CAPS: 100.0000 mg | ORAL_CAPSULE | Freq: Two times a day (BID) | ORAL | Status: DC | PRN

## 2020-06-26 MED ORDER — FENTANYL CITRATE (PF) 100 MCG/2ML IJ SOLN
INTRAMUSCULAR | Status: AC
  Administered 2020-06-26: 50 ug via INTRAVENOUS
  Filled 2020-06-26: qty 2

## 2020-06-26 NOTE — H&P (Signed)
NAME:  Keith [name], MRN:  [id number], DOB:  1950/11/01, LOS: 0 ADMISSION DATE:  06/26/2020, CONSULTATION DATE:  06/26/2020  REFERRING MD:  EDP, [place], CHIEF COMPLAINT: Chest pain, near syncope  Brief History   69 year old with chronic back pain on opiates admitted with saddle PE and obstructive shock  History of present illness   Presented with near syncope after returning from the bathroom.  Also complained of chest pain like "elephant sitting on his chest" noted to be hypoxic with saturation in the 80s, blood pressure in the 50s, brought in by EMS as a code STEMI .  Placed on nonrebreather, initial blood pressure 70s, started on Levophed, central line placed in ED, CT angiogram obtained which confirmed saddle PE Recent admission 3 weeks ago for acute on chronic back pain he is on chronic opiates for back pain for at least 2 years by neurosurgery.  He reports since his admission he has mostly been sitting around, limited movement  Past Medical History  Chronic back pain , chronic opiate use Diabetes type 2 with neuropathy Scoliosis Lumbar stenosis T11-12 laminectomy 07/2018 Recent admission 8/24-8/28 under neurosurgery for acute on chronic low back pain, numbness tingling and weakness in bilateral lower extremities >> treated with IV steroids  Significant Hospital Events   9/19 IV tpa in ED  Consults:    Procedures:    Significant Diagnostic Tests:  CT angiogram chest/abdomen/pelvis 9/19>> Extensive bilateral pulmonary emboli with saddle embolus and associated right heart strain Left adrenal mass?  [place]  MRI T-spine and L-spine 8/25 >> T11-12 cord compression with myelopathic findings despite interval laminectomy L3-4 severe spinal stenosis, L4-5 high-grade spinal stenosis  Micro Data:    Antimicrobials:     Interim history/subjective:   On high flow nasal cannula Levophed 15 mics  Objective   Blood pressure 92/64, pulse (!) 106, resp. rate (!) 26, height  6' (1.829 m), weight 121.1 kg, SpO2 100 %.       No intake or output data in the 24 hours ending 06/26/20 2123 Filed Weights   06/26/20 2011  Weight: 121.1 kg    Examination: General: Elderly, obese man, mild distress, able to speak in full sentences HENT: No pallor, JVD Lungs: Bilateral decreased breath sounds, no rhonchi Cardiovascular: S1-S2 tacky, regular, sinus on monitor, wide-complex Abdomen: Soft, obese, nontender Extremities: Left swollen, mild tenderness 1+ edema Neuro: Alert, interactive, nonfocal   Chest x-ray independently reviewed shows right IJ in position, no infiltrates or effusions or pneumothorax EKG shows RBBB with sinus tachycardia  Resolved Hospital Problem list     Assessment & Plan:  Acute massive/saddle PE with hemodynamic compromise -Proceed with TPA followed by IV heparin  -Detailed history obtained, no contraindication his last back surgery was 2 years ago, he has a remote history of peptic ulcer bleeding , last epidural injection was May 2021 -Risks and benefits of IV TPA discussed in detail including chances of bleeding and in particular intracranial hemorrhage and death .  His presentation with hypotension and severe hypoxia does place him in a very high mortality group without lytics  -Obtain 2D echo and venous duplex -Use Levophed as needed for MAP 65 -Hold BP meds  Acute hypoxic respiratory failure -related to above, titrate oxygen as needed   AKI -related to above, given 1 L saline we will keep  IV fluids  Chronic opiate use -resume oral oxycodone, hold OxyContin until clinical improvement and course better defined.  Uncontrolled diabetes -likely related to steroids that he recently  received -Start Lantus 10 units -SSI resistant scale  Best practice:  Diet: N.p.o. until a.m. Pain/Anxiety/Delirium protocol (if indicated): Oxycodone VAP protocol (if indicated): N/A DVT prophylaxis: IV heparin GI prophylaxis: N/A Glucose control:  Lantus/SSI Mobility: Bedrest Code Status: Full Family Communication: Wife and daughter at bedside Disposition: ICU  Labs   CBC: Recent Labs  Lab 06/26/20 2001 06/26/20 2006  WBC 8.6  --   NEUTROABS 4.5  --   HGB 12.2* 13.3  HCT 40.7 39.0  MCV 101.2*  --   PLT 353  --     Basic Metabolic Panel: Recent Labs  Lab 06/26/20 2001 06/26/20 2006  NA 141 142  K 3.7 3.6  CL 105 108  CO2 18*  --   GLUCOSE 328* 318*  BUN 24* 27*  CREATININE 2.00* 1.70*  CALCIUM 9.9  --    GFR: Estimated Creatinine Clearance: 56.7 mL/min (A) (by C-G formula based on SCr of 1.7 mg/dL (H)). Recent Labs  Lab 06/26/20 2001  WBC 8.6    Liver Function Tests: Recent Labs  Lab 06/26/20 2001  AST 98*  ALT 56*  ALKPHOS 88  BILITOT 0.8  PROT 7.7  ALBUMIN 3.5   No results for input(s): LIPASE, AMYLASE in the last 168 hours. No results for input(s): AMMONIA in the last 168 hours.  ABG    Component Value Date/Time   TCO2 18 (L) 06/26/2020 2006     Coagulation Profile: Recent Labs  Lab 06/26/20 2001  INR 1.1    Cardiac Enzymes: No results for input(s): CKTOTAL, CKMB, CKMBINDEX, TROPONINI in the last 168 hours.  HbA1C: Hgb A1c MFr Bld  Date/Time Value Ref Range Status  03/30/2020 04:50 PM 6.2 4.6 - 6.5 % Final    Comment:    Glycemic Control Guidelines for People with Diabetes:Non Diabetic:  <6%Goal of Therapy: <7%Additional Action Suggested:  >8%   11/19/2018 02:42 PM 6.6 (H) 4.6 - 6.5 % Final    Comment:    Glycemic Control Guidelines for People with Diabetes:Non Diabetic:  <6%Goal of Therapy: <7%Additional Action Suggested:  >8%     CBG: No results for input(s): GLUCAP in the last 168 hours.  Review of Systems:   Last epidural injection 02/2020, chronic back pain Difficulty ambulation has mostly been sitting around for the last 3 weeks Chest pain was 9/10 on arrival, has now decreased to 7/10 Shortness of breath Left leg swelling  Past Medical History  He,  has a  past medical history of Allergy, Chronic back pain, Diabetes mellitus without complication ([place]), Duodenal ulcer hemorrhage (08/29/2014), ED (erectile dysfunction), Esophageal stricture (08/30/2014), Glucose intolerance (impaired glucose tolerance), Hiatal hernia (08/30/2014), Hypertension, Hypokalemia (04/11/2013), MRSA carrier (08/30/2014), Obesity, Osteoarthritis, Scoliosis (2016), Spinal stenosis of lumbar region, and Urinary tract infection (04/11/2013).   Surgical History    Past Surgical History:  Procedure Laterality Date  . COLONOSCOPY  2008  . ESOPHAGOGASTRODUODENOSCOPY N/A 08/29/2014   Procedure: ESOPHAGOGASTRODUODENOSCOPY (EGD);  Surgeon: Lafayette Dragon, MD;  Location: [facility];  Service: Endoscopy;  Laterality: N/A;  . LAMINECTOMY N/A 07/28/2018   Procedure: THORACIC ELEVEN- THORACIC TWELVE POSTERIOR DECOMPRESSION LAMINECTOMY;  Surgeon: Judith Part, MD;  Location: [facility];  Service: Neurosurgery;  Laterality: N/A;  . LUMBAR LAMINECTOMY    . NASAL POLYP SURGERY    . TONSILLECTOMY       Social History   reports that he has quit smoking. He has a 25.00 pack-year smoking history. He has never used smokeless tobacco. He reports previous alcohol  use. He reports that he does not use drugs.   Family History   His family history includes Asthma in his mother; Diabetes in his mother; Hypertension in his father; Stroke in his father. There is no history of Colon cancer.   Allergies Allergies  Allergen Reactions  . Lisinopril Other (See Comments)    Body Aches     Home Medications  Prior to Admission medications   Medication Sig Start Date End Date Taking? Authorizing Provider  atenolol-chlorthalidone (TENORETIC) 50-25 MG tablet Take 1 tablet by mouth daily. Pt needs appt for further refills. 03/30/20   [name], [name], MD  cloNIDine (CATAPRES) 0.1 MG tablet Take 1 tablet (0.1 mg total) by mouth daily. 03/30/20   [name], [name], MD  cyclobenzaprine (FLEXERIL) 10 MG tablet  Take 1 tablet (10 mg total) by mouth 3 (three) times daily as needed for muscle spasms. 06/04/20   Viona Gilmore D, NP  docusate sodium (COLACE) 100 MG capsule Take 1 capsule (100 mg total) by mouth 2 (two) times daily. 06/04/20   Viona Gilmore D, NP  DULoxetine (CYMBALTA) 30 MG capsule Take 60 mg by mouth daily. 05/01/20   [provider]  metFORMIN (GLUCOPHAGE) 500 MG tablet Take 0.5 tablets (250 mg total) by mouth daily with breakfast. 03/30/20   [name], [name], MD  methylPREDNISolone (MEDROL DOSEPAK) 4 MG TBPK tablet Take as directed on package insert. 06/04/20   Viona Gilmore D, NP  Multiple Vitamin (MULITIVITAMIN WITH MINERALS) TABS Take 1 tablet by mouth daily.    [provider]  oxyCODONE (OXY IR/ROXICODONE) 5 MG immediate release tablet Take 1-2 tablets (5-10 mg total) by mouth every 6 (six) hours as needed for severe pain ((score 7 to 10)). 06/04/20   Bergman, Trinda Pascal D, NP  OXYCONTIN 10 MG 12 hr tablet Take 10 mg by mouth 3 (three) times daily.  10/21/18   [provider]  sildenafil (REVATIO) 20 MG tablet TAKE 1-2 TABLETS 2-3 HOURS PRIOR TO SEX Patient taking differently: Take 20 mg by mouth as needed (prior to sex).  12/07/19   [name], [name], MD  simvastatin (ZOCOR) 40 MG tablet Take 1 tablet (40 mg total) by mouth daily. 03/30/20   [name], [name], MD     Critical care time: [place] MD. [facility]. [facility] See Amion for pager  If no response to pager , please call [phone]  After 7:00 pm call Elink  [phone]   06/26/2020

## 2020-06-26 NOTE — Progress Notes (Signed)
ANTICOAGULATION CONSULT NOTE - Initial Consult  Pharmacy Consult for heparin Indication: pulmonary embolus  Allergies  Allergen Reactions  . Lisinopril Other (See Comments)    Body Aches    Patient Measurements: Height: 6' (182.9 cm) Weight: 121.1 kg (267 lb) IBW/kg (Calculated) : 77.6 Heparin Dosing Weight:   Vital Signs: BP: 70/51 (09/19 2051) Pulse Rate: 122 (09/19 2051)  Labs: Recent Labs    06/26/20 2001 06/26/20 2006  HGB 12.2* 13.3  HCT 40.7 39.0  PLT 353  --   LABPROT 14.2  --   INR 1.1  --   CREATININE  --  1.70*    Estimated Creatinine Clearance: 56.7 mL/min (A) (by C-G formula based on SCr of 1.7 mg/dL (H)).   Medical History: Past Medical History:  Diagnosis Date  . Allergy   . Chronic back pain   . Diabetes mellitus without complication ([place])   . Duodenal ulcer hemorrhage 08/29/2014  . ED (erectile dysfunction)   . Esophageal stricture 08/30/2014  . Glucose intolerance (impaired glucose tolerance)   . Hiatal hernia 08/30/2014  . Hypertension   . Hypokalemia 04/11/2013  . 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 Obesity   . Osteoarthritis   . Scoliosis 2016  . Spinal stenosis of lumbar region   . Urinary tract infection 04/11/2013    Medications:  (Not in a hospital admission)   Assessment: 73 YOM who presents with massive PE and saddle embolus and associated R heart strain. Patient to receive systemic thrombolysis with IV tPA. Pharmacy consulted to start IV heparin after tPA has finished infusing  Goal of Therapy:  Heparin level 0.3-0.5 units/ml x 24 hours post tPA, then increase to 0.3 - 0.5 units/mL  Monitor platelets by anticoagulation protocol: Yes   Plan:  -Order aPTT 30 minutes post tPA infusion -Will plan to start IV heparin when aPTT < 80s. No heparin bolus in the setting of IV tPA  Albertina Parr, PharmD., BCPS, BCCCP Clinical Pharmacist Clinical phone for 06/26/20 until 10pm: [phone] If after 10pm, please refer to [facility] for  unit-specific pharmacist

## 2020-06-26 NOTE — ED Triage Notes (Signed)
Pt states he was with family coming from the bathroom he felt substernal chest pain and felt nauseous.

## 2020-06-26 NOTE — ED Provider Notes (Addendum)
[facility] EMERGENCY DEPARTMENT Provider Note   CSN: [id number] Arrival date & time: 06/26/20  1956  LEVEL 5 CAVEAT - ACUITY OF CONDITION  History Chief Complaint  Patient presents with  . Code STEMI    Keith [name] is a 69 y.o. male.  HPI 69 year old male presents via EMS with hypotension, hypoxia, chest pain and near syncope.  The patient states he was doing okay when he went to the bathroom.  When he got out he started to feel lightheaded and had to sit down.  Felt like there was an elephant on his chest and still sort of is.  It is also involving his upper abdomen.  He has chronic back pain but no new back pain.  EMS noted him to be hypoxic into the 80s and his original blood pressure was in the 50s.  They have been unable to get IV access.  They have given him 324 mg aspirin. EMS activated a code STEMI.  Past Medical History:  Diagnosis Date  . Allergy   . Chronic back pain   . Diabetes mellitus without complication ([place])   . Duodenal ulcer hemorrhage 08/29/2014  . ED (erectile dysfunction)   . Esophageal stricture 08/30/2014  . Glucose intolerance (impaired glucose tolerance)   . Hiatal hernia 08/30/2014  . Hypertension   . Hypokalemia 04/11/2013  . 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 Obesity   . Osteoarthritis   . Scoliosis 2016  . Spinal stenosis of lumbar region   . Urinary tract infection 04/11/2013    Patient Active Problem List   Diagnosis Date Noted  . Pulmonary embolism ([place]) 06/26/2020  . Lumbar spinal stenosis 06/01/2020  . Spinal cord compression ([place]) 04/02/2020  . Thoracic myelopathy 07/25/2018  . Hyperlipidemia associated with type 2 diabetes mellitus ([place]) 11/14/2016  . Type 2 diabetes mellitus with diabetic neuropathy, unspecified ([place]) 11/14/2016  . Routine general medical examination at a health care facility 11/14/2015  . Hiatal hernia 08/30/2014  . Allergic rhinitis 04/10/2013  . Spinal stenosis of lumbar region 11/06/2012  .  Allergic rhinitis due to pollen 01/15/2011  . OSTEOARTHRITIS 11/02/2008  . HIP PAIN, LEFT 02/09/2008  . Morbid obesity ([place]) 11/07/2007  . ERECTILE DYSFUNCTION, MILD 11/07/2007  . Essential hypertension 11/07/2007    Past Surgical History:  Procedure Laterality Date  . COLONOSCOPY  2008  . ESOPHAGOGASTRODUODENOSCOPY N/A 08/29/2014   Procedure: ESOPHAGOGASTRODUODENOSCOPY (EGD);  Surgeon: Lafayette Dragon, MD;  Location: [facility];  Service: Endoscopy;  Laterality: N/A;  . LAMINECTOMY N/A 07/28/2018   Procedure: THORACIC ELEVEN- THORACIC TWELVE POSTERIOR DECOMPRESSION LAMINECTOMY;  Surgeon: Judith Part, MD;  Location: [facility];  Service: Neurosurgery;  Laterality: N/A;  . LUMBAR LAMINECTOMY    . NASAL POLYP SURGERY    . TONSILLECTOMY         Family History  Problem Relation Age of Onset  . Diabetes Mother   . Asthma Mother   . Hypertension Father   . Stroke Father   . Colon cancer Neg Hx     Social History   Tobacco Use  . Smoking status: Former Smoker    Packs/day: 1.00    Years: 25.00    Pack years: 25.00  . Smokeless tobacco: Never Used  Vaping Use  . Vaping Use: Never used  Substance Use Topics  . Alcohol use: Not Currently    Comment: Christmas/4th of July  . Drug use: No    Home Medications Prior to Admission medications   Medication Sig  Start Date End Date Taking? Authorizing Provider  atenolol-chlorthalidone (TENORETIC) 50-25 MG tablet Take 1 tablet by mouth daily. Pt needs appt for further refills. 03/30/20   [name], [name], MD  cloNIDine (CATAPRES) 0.1 MG tablet Take 1 tablet (0.1 mg total) by mouth daily. 03/30/20   [name], [name], MD  cyclobenzaprine (FLEXERIL) 10 MG tablet Take 1 tablet (10 mg total) by mouth 3 (three) times daily as needed for muscle spasms. 06/04/20   Viona Gilmore D, NP  docusate sodium (COLACE) 100 MG capsule Take 1 capsule (100 mg total) by mouth 2 (two) times daily. 06/04/20   Viona Gilmore D, NP  DULoxetine (CYMBALTA) 30  MG capsule Take 60 mg by mouth daily. 05/01/20   [provider]  metFORMIN (GLUCOPHAGE) 500 MG tablet Take 0.5 tablets (250 mg total) by mouth daily with breakfast. 03/30/20   [name], [name], MD  methylPREDNISolone (MEDROL DOSEPAK) 4 MG TBPK tablet Take as directed on package insert. 06/04/20   Viona Gilmore D, NP  Multiple Vitamin (MULITIVITAMIN WITH MINERALS) TABS Take 1 tablet by mouth daily.    [provider]  oxyCODONE (OXY IR/ROXICODONE) 5 MG immediate release tablet Take 1-2 tablets (5-10 mg total) by mouth every 6 (six) hours as needed for severe pain ((score 7 to 10)). 06/04/20   Bergman, Trinda Pascal D, NP  OXYCONTIN 10 MG 12 hr tablet Take 10 mg by mouth 3 (three) times daily.  10/21/18   [provider]  sildenafil (REVATIO) 20 MG tablet TAKE 1-2 TABLETS 2-3 HOURS PRIOR TO SEX Patient taking differently: Take 20 mg by mouth as needed (prior to sex).  12/07/19   [name], [name], MD  simvastatin (ZOCOR) 40 MG tablet Take 1 tablet (40 mg total) by mouth daily. 03/30/20   [name], [name], MD    Allergies    Lisinopril  Review of Systems   Review of Systems  Unable to perform ROS: Acuity of condition    Physical Exam Updated Vital Signs BP 112/65   Pulse (!) 114   Resp (!) 21   Ht 6' (1.829 m)   Wt 121.1 kg   SpO2 98%   BMI 36.21 kg/m   Physical Exam Vitals and nursing note reviewed.  Constitutional:      Appearance: He is well-developed. He is obese. He is ill-appearing.  HENT:     Head: Normocephalic and atraumatic.     Right Ear: External ear normal.     Left Ear: External ear normal.     Nose: Nose normal.  Eyes:     General:        Right eye: No discharge.        Left eye: No discharge.  Cardiovascular:     Rate and Rhythm: Regular rhythm. Tachycardia present.     Heart sounds: Normal heart sounds.  Pulmonary:     Effort: Pulmonary effort is normal. Tachypnea (mild) present. No accessory muscle usage or respiratory distress.     Breath  sounds: Normal breath sounds. No wheezing.  Abdominal:     General: There is no distension.     Palpations: Abdomen is soft.     Tenderness: There is no abdominal tenderness.  Musculoskeletal:     Cervical back: Neck supple.     Comments: Mild edema in BLE.  Left leg seems slightly larger than the right though patient states this is baseline  Skin:    General: Skin is dry.     Comments: Cool extremities bilaterally  Neurological:     Mental Status: He is alert.  Psychiatric:        Mood and Affect: Mood is not anxious.     ED Results / Procedures / Treatments   Labs (all labs ordered are listed, but only abnormal results are displayed) Labs Reviewed  COMPREHENSIVE METABOLIC PANEL - Abnormal; Notable for the following components:      Result Value   CO2 18 (*)    Glucose, Bld 328 (*)    BUN 24 (*)    Creatinine, Ser 2.00 (*)    AST 98 (*)    ALT 56 (*)    GFR calc non Af Amer 34 (*)    GFR calc Af Amer 39 (*)    Anion gap 18 (*)    All other components within normal limits  CBC WITH DIFFERENTIAL/PLATELET - Abnormal; Notable for the following components:   RBC 4.02 (*)    Hemoglobin 12.2 (*)    MCV 101.2 (*)    All other components within normal limits  I-STAT CHEM 8, ED - Abnormal; Notable for the following components:   BUN 27 (*)    Creatinine, Ser 1.70 (*)    Glucose, Bld 318 (*)    Calcium, Ion 1.11 (*)    TCO2 18 (*)    All other components within normal limits  TROPONIN I (HIGH SENSITIVITY) - Abnormal; Notable for the following components:   Troponin I (High Sensitivity) 45 (*)    All other components within normal limits  SARS CORONAVIRUS 2 BY RT PCR (HOSPITAL ORDER, [facility])  CULTURE, BLOOD (ROUTINE X 2)  CULTURE, BLOOD (ROUTINE X 2)  BRAIN NATRIURETIC PEPTIDE  PROTIME-INR  ETHANOL  URINALYSIS, ROUTINE W REFLEX MICROSCOPIC  LACTIC ACID, PLASMA  LACTIC ACID, PLASMA  APTT  PROTIME-INR  HIV ANTIBODY (ROUTINE TESTING W  REFLEX)  CBC  BLOOD GAS, ARTERIAL  BASIC METABOLIC PANEL  MAGNESIUM  PHOSPHORUS  CORTISOL-AM, BLOOD  CBC  TYPE AND SCREEN  TROPONIN I (HIGH SENSITIVITY)    EKG EKG Interpretation  Date/Time:  Sunday June 26 2020 19:57:44 EDT Ventricular Rate:  123 PR Interval:    QRS Duration: 120 QT Interval:  314 QTC Calculation: 450 R Axis:   -72 Text Interpretation: Sinus or ectopic atrial tachycardia IVCD, consider atypical RBBB Inferolateral infarct, old QRS is wider compared to 2015 Confirmed by Sherwood Gambler [phone]) on 06/26/2020 8:11:07 PM   Radiology DG Chest Portable 1 View  Result Date: 06/26/2020 CLINICAL DATA:  Check central line placement EXAM: PORTABLE CHEST 1 VIEW COMPARISON:  08/29/2014, CT from earlier in the same day. FINDINGS: Cardiac shadow is mildly prominent but stable. Right jugular central line is noted at the cavoatrial junction. No pneumothorax is seen. The overall inspiratory effort is poor although no focal infiltrate is seen. No bony abnormality is noted. IMPRESSION: No pneumothorax following central line placement as described. Electronically Signed   By: Inez Catalina M.D.   On: 06/26/2020 21:36   CT Angio Chest/Abd/Pel for Dissection W and/or Wo Contrast  Result Date: 06/26/2020 CLINICAL DATA:  Abdominal pain, chest pain and nausea. EXAM: CT ANGIOGRAPHY CHEST, ABDOMEN AND PELVIS TECHNIQUE: Non-contrast CT of the chest was initially obtained. Multidetector CT imaging through the chest, abdomen and pelvis was performed using the standard protocol during bolus administration of intravenous contrast. Multiplanar reconstructed images and MIPs were obtained and reviewed to evaluate the vascular anatomy. CONTRAST:  125mL OMNIPAQUE IOHEXOL 350 MG/ML SOLN COMPARISON:  January 19, 2012 FINDINGS: CTA CHEST FINDINGS Cardiovascular: An extensive amount of intraluminal low attenuation is seen involving numerous right middle lobe, upper lobe and lower lobe branches of the  bilateral pulmonary arteries. Saddle embolus is also seen. There is mild cardiomegaly with enlargement of the right ventricle noted. No pericardial effusion. Mediastinum/Nodes: No enlarged mediastinal, hilar, or axillary lymph nodes. Thyroid gland, trachea, and esophagus demonstrate no significant findings. Lungs/Pleura: Lungs are clear. No pleural effusion or pneumothorax. Musculoskeletal: Marked severity multilevel degenerative changes seen throughout the thoracic spine with marked severity scoliosis. Review of the MIP images confirms the above findings. CTA ABDOMEN AND PELVIS FINDINGS VASCULAR Aorta: Normal caliber aorta without aneurysm, dissection, vasculitis or significant stenosis. Celiac: Patent without evidence of aneurysm, dissection, vasculitis or significant stenosis. SMA: Patent without evidence of aneurysm, dissection, vasculitis or significant stenosis. Renals: Both renal arteries are patent without evidence of aneurysm, dissection, vasculitis, fibromuscular dysplasia or significant stenosis. IMA: Patent without evidence of aneurysm, dissection, vasculitis or significant stenosis. Inflow: Patent without evidence of aneurysm, dissection, vasculitis or significant stenosis. Veins: No obvious venous abnormality within the limitations of this arterial phase study. Review of the MIP images confirms the above findings. NON-VASCULAR Hepatobiliary: No focal liver abnormality is seen. No gallstones, gallbladder wall thickening, or biliary dilatation. Pancreas: Unremarkable. No pancreatic ductal dilatation or surrounding inflammatory changes. Spleen: Normal in size without focal abnormality. Adrenals/Urinary Tract: There is mild diffuse enlargement of the bilateral adrenal glands. A 2.2 cm x 1.9 cm low-attenuation left adrenal mass is also seen. Kidneys are normal in size, without renal calculi or hydronephrosis. A 1.9 cm diameter simple cyst is seen within the posterolateral aspect of the mid left kidney.  Bladder is unremarkable. Stomach/Bowel: There is a small hiatal hernia. Appendix appears normal. No evidence of bowel wall thickening, distention, or inflammatory changes. Noninflamed diverticula are seen throughout the sigmoid colon. Lymphatic: No abnormal abdominal or pelvic lymph nodes are seen. Reproductive: Prostate is unremarkable. Other: There is a 2.5 cm x 2.2 cm fat containing umbilical hernia. No abdominopelvic ascites. Musculoskeletal: Marked severity multilevel degenerative changes seen throughout the lumbar spine with marked severity scoliosis. Review of the MIP images confirms the above findings. IMPRESSION: 1. Extensive bilateral pulmonary emboli with saddle embolus and associated right heart strain. 2. 2.2 cm x 1.9 cm low-attenuation left adrenal mass which may represent an adrenal adenoma. 3. Noninflamed sigmoid diverticulosis. 4. Marked severity multilevel degenerative changes throughout the thoracic and lumbar spine with marked severity scoliosis. Electronically Signed   By: Virgina Norfolk M.D.   On: 06/26/2020 20:47    Procedures .Critical Care Performed by: Sherwood Gambler, MD Authorized by: Sherwood Gambler, MD   Critical care provider statement:    Critical care time (minutes):  70   Critical care time was exclusive of:  Separately billable procedures and treating other patients   Critical care was necessary to treat or prevent imminent or life-threatening deterioration of the following conditions:  Circulatory failure, cardiac failure and respiratory failure   Critical care was time spent personally by me on the following activities:  Discussions with consultants, evaluation of patient's response to treatment, examination of patient, ordering and performing treatments and interventions, ordering and review of laboratory studies, ordering and review of radiographic studies, pulse oximetry, re-evaluation of patient's condition, obtaining history from patient or surrogate, review  of old charts and vascular access procedures .Central Line  Date/Time: 06/26/2020 10:02 PM Performed by: Sherwood Gambler, MD Authorized by: Sherwood Gambler, MD   Consent:  Consent obtained:  Emergent situation Pre-procedure details:    Skin preparation:  ChloraPrep Anesthesia (see MAR for exact dosages):    Anesthesia method:  Local infiltration   Local anesthetic:  Lidocaine 1% w/o epi Procedure details:    Location:  R internal jugular   Patient position:  Trendelenburg   Procedural supplies:  Triple lumen   Landmarks identified: yes     Ultrasound guidance: yes     Sterile ultrasound techniques: Sterile gel and sterile probe covers were used     Number of attempts:  1   Successful placement: yes   Post-procedure details:    Post-procedure:  Dressing applied and line sutured   Assessment:  Blood return through all ports, free fluid flow, no pneumothorax on x-ray and placement verified by x-ray   Patient tolerance of procedure:  Tolerated well, no immediate complications Ultrasound ED Peripheral IV (Provider)  Date/Time: 06/26/2020 10:54 PM Performed by: Sherwood Gambler, MD Authorized by: Sherwood Gambler, MD   Procedure details:    Indications: hypotension and poor IV access     Skin Prep: chlorhexidine gluconate     Location:  Right AC   Angiocath:  18 G   Bedside Ultrasound Guided: Yes     Patient tolerated procedure without complications: Yes     Dressing applied: Yes   Ultrasound ED Peripheral IV (Provider)  Date/Time: 06/26/2020 10:55 PM Performed by: Sherwood Gambler, MD Authorized by: Sherwood Gambler, MD   Procedure details:    Indications: hypotension and multiple failed IV attempts     Skin Prep: chlorhexidine gluconate     Location:  Left AC   Angiocath:  18 G   Bedside Ultrasound Guided: Yes     Patient tolerated procedure without complications: Yes     Dressing applied: Yes     (including critical care time)  Medications Ordered in ED Medications    norepinephrine (LEVOPHED) 4mg  in 241mL premix infusion (15 mcg/min Intravenous Rate/Dose Change 06/26/20 2101)  0.9 %  sodium chloride infusion (has no administration in time range)  alteplase (ACTIVASE) 1 mg/mL infusion 50 mg (50 mg Intravenous New Bag/Given 06/26/20 2133)  docusate sodium (COLACE) capsule 100 mg (has no administration in time range)  polyethylene glycol (MIRALAX / GLYCOLAX) packet 17 g (has no administration in time range)  alteplase (ACTIVASE) 1 mg/mL infusion 50 mg (has no administration in time range)  sodium chloride 0.9 % bolus 500 mL (0 mLs Intravenous Stopped 06/26/20 2134)  iohexol (OMNIPAQUE) 350 MG/ML injection 100 mL (100 mLs Intravenous Contrast Given 06/26/20 2031)  sodium chloride 0.9 % bolus 1,000 mL (0 mLs Intravenous Stopped 06/26/20 2134)  0.9 %  sodium chloride infusion (250 mLs Intravenous New Bag/Given 06/26/20 2142)  fentaNYL (SUBLIMAZE) injection 50 mcg (50 mcg Intravenous Given 06/26/20 2052)    ED Course  I have reviewed the triage vital signs and the nursing notes.  Pertinent labs & imaging results that were available during my care of the patient were reviewed by me and considered in my medical decision making (see chart for details).    MDM Rules/Calculators/A&P                          Patient presents with shock and ill appearance.  He was brought in as a code STEMI but his ECG does not really show obvious STEMI.  He is hypoxic and hypotensive.  However he is also complaining of severe chest pressure.  At one point it  was endorse that he had back pain as well.  Due to this, dissection protocol was obtained which shows no dissection but does show saddle pulmonary emboli.  Given this I had discussion with patient about TPA as his pressure has dropped into the 60s.  He was started on peripheral Levophed and after consultation with ICU, a right IJ central line was placed by myself prior to the TPA as this could not be accessed later and he is quite ill.   His pressure has been coming up with the Levophed prior to TPA.  He is maintaining his airway.  We discussed risk/benefits of TPA.  He has not had any significant procedure in several months.  He did have a remote GI bleed over 7 years ago.  It was felt that the benefits of TPA outweigh the risks and he was given this in the emergency department.  ICU to admit. Final Clinical Impression(s) / ED Diagnoses Final diagnoses:  Acute saddle pulmonary embolism without acute cor pulmonale ([place])  Shock ([place])    Rx / DC Orders ED Discharge Orders    None       Sherwood Gambler, MD 06/26/20 2209    Sherwood Gambler, MD 06/26/20 2255

## 2020-06-27 ENCOUNTER — Inpatient Hospital Stay (HOSPITAL_COMMUNITY): Payer: Medicare Other

## 2020-06-27 DIAGNOSIS — J9611 Chronic respiratory failure with hypoxia: Secondary | ICD-10-CM

## 2020-06-27 DIAGNOSIS — I2692 Saddle embolus of pulmonary artery without acute cor pulmonale: Secondary | ICD-10-CM

## 2020-06-27 DIAGNOSIS — I2602 Saddle embolus of pulmonary artery with acute cor pulmonale: Principal | ICD-10-CM

## 2020-06-27 DIAGNOSIS — J9601 Acute respiratory failure with hypoxia: Secondary | ICD-10-CM

## 2020-06-27 DIAGNOSIS — I2699 Other pulmonary embolism without acute cor pulmonale: Secondary | ICD-10-CM

## 2020-06-27 LAB — POCT I-STAT EG7
Acid-base deficit: 2 mmol/L (ref 0.0–2.0)
Bicarbonate: 23.8 mmol/L (ref 20.0–28.0)
Calcium, Ion: 1.26 mmol/L (ref 1.15–1.40)
HCT: 31 % — ABNORMAL LOW (ref 39.0–52.0)
Hemoglobin: 10.5 g/dL — ABNORMAL LOW (ref 13.0–17.0)
O2 Saturation: 69 %
Patient temperature: 98.8
Potassium: 4.1 mmol/L (ref 3.5–5.1)
Sodium: 141 mmol/L (ref 135–145)
TCO2: 25 mmol/L (ref 22–32)
pCO2, Ven: 41.9 mmHg — ABNORMAL LOW (ref 44.0–60.0)
pH, Ven: 7.363 (ref 7.250–7.430)
pO2, Ven: 38 mmHg (ref 32.0–45.0)

## 2020-06-27 LAB — CBC
HCT: 33.7 % — ABNORMAL LOW (ref 39.0–52.0)
Hemoglobin: 10.7 g/dL — ABNORMAL LOW (ref 13.0–17.0)
MCH: 30.7 pg (ref 26.0–34.0)
MCHC: 31.8 g/dL (ref 30.0–36.0)
MCV: 96.6 fL (ref 80.0–100.0)
Platelets: 289 10*3/uL (ref 150–400)
RBC: 3.49 MIL/uL — ABNORMAL LOW (ref 4.22–5.81)
RDW: 15.2 % (ref 11.5–15.5)
WBC: 12 10*3/uL — ABNORMAL HIGH (ref 4.0–10.5)
nRBC: 0 % (ref 0.0–0.2)

## 2020-06-27 LAB — MRSA PCR SCREENING: MRSA by PCR: NEGATIVE

## 2020-06-27 LAB — BASIC METABOLIC PANEL
Anion gap: 11 (ref 5–15)
BUN: 27 mg/dL — ABNORMAL HIGH (ref 8–23)
CO2: 22 mmol/L (ref 22–32)
Calcium: 9 mg/dL (ref 8.9–10.3)
Chloride: 105 mmol/L (ref 98–111)
Creatinine, Ser: 1.95 mg/dL — ABNORMAL HIGH (ref 0.61–1.24)
GFR calc Af Amer: 40 mL/min — ABNORMAL LOW (ref >60)
GFR calc non Af Amer: 35 mL/min — ABNORMAL LOW (ref >60)
Glucose, Bld: 204 mg/dL — ABNORMAL HIGH (ref 70–99)
Potassium: 4.3 mmol/L (ref 3.5–5.1)
Sodium: 138 mmol/L (ref 135–145)

## 2020-06-27 LAB — GLUCOSE, CAPILLARY
Glucose-Capillary: 153 mg/dL — ABNORMAL HIGH (ref 70–99)
Glucose-Capillary: 78 mg/dL (ref 70–99)
Glucose-Capillary: 94 mg/dL (ref 70–99)
Glucose-Capillary: 108 mg/dL — ABNORMAL HIGH (ref 70–99)
Glucose-Capillary: 167 mg/dL — ABNORMAL HIGH (ref 70–99)

## 2020-06-27 LAB — HEMOGLOBIN A1C
Hgb A1c MFr Bld: 6.7 % — ABNORMAL HIGH (ref 4.8–5.6)
Mean Plasma Glucose: 145.59 mg/dL

## 2020-06-27 LAB — ECHOCARDIOGRAM COMPLETE
Height: 72 in
S' Lateral: 3.1 cm
Weight: 4271.63 oz

## 2020-06-27 LAB — PHOSPHORUS: Phosphorus: 4.6 mg/dL (ref 2.5–4.6)

## 2020-06-27 LAB — APTT: aPTT: 72 seconds — ABNORMAL HIGH (ref 24–36)

## 2020-06-27 LAB — HEPARIN LEVEL (UNFRACTIONATED)
Heparin Unfractionated: 0.58 IU/mL (ref 0.30–0.70)
Heparin Unfractionated: 0.38 IU/mL (ref 0.30–0.70)

## 2020-06-27 LAB — MAGNESIUM: Magnesium: 2.3 mg/dL (ref 1.7–2.4)

## 2020-06-27 MED ORDER — INSULIN ASPART 100 UNIT/ML ~~LOC~~ SOLN
0.0000 [IU] | Freq: Every day | SUBCUTANEOUS | Status: DC
  Administered 2020-07-01: 3 [IU] via SUBCUTANEOUS

## 2020-06-27 MED ORDER — OXYCODONE HCL 5 MG PO TABS
5.0000 mg | ORAL_TABLET | Freq: Four times a day (QID) | ORAL | Status: DC | PRN
  Administered 2020-06-27 – 2020-07-04 (×17): 5 mg via ORAL
  Filled 2020-06-27 (×17): qty 1

## 2020-06-27 MED ORDER — CHLORHEXIDINE GLUCONATE CLOTH 2 % EX PADS
6.0000 | MEDICATED_PAD | Freq: Every day | CUTANEOUS | Status: DC
  Administered 2020-06-27: 6 via TOPICAL

## 2020-06-27 MED ORDER — HEPARIN (PORCINE) 25000 UT/250ML-% IV SOLN
1800.0000 [IU]/h | INTRAVENOUS | Status: AC
  Administered 2020-06-27: 1400 [IU]/h via INTRAVENOUS
  Administered 2020-06-29: 1500 [IU]/h via INTRAVENOUS
  Administered 2020-06-30 (×2): 1800 [IU]/h via INTRAVENOUS
  Filled 2020-06-27 (×6): qty 250

## 2020-06-27 MED ORDER — INSULIN ASPART 100 UNIT/ML ~~LOC~~ SOLN
0.0000 [IU] | Freq: Three times a day (TID) | SUBCUTANEOUS | Status: DC
  Administered 2020-06-28 (×2): 1 [IU] via SUBCUTANEOUS
  Administered 2020-06-28 – 2020-06-30 (×5): 2 [IU] via SUBCUTANEOUS
  Administered 2020-07-01 – 2020-07-02 (×2): 1 [IU] via SUBCUTANEOUS

## 2020-06-27 NOTE — Progress Notes (Signed)
VASCULAR LAB    Bilateral lower extremity venous duplex has been performed.  See CV proc for preliminary results.  Messaged Dr. Valeta Harms with results.  Nijel Flink, RVT 06/27/2020, 10:52 AM

## 2020-06-27 NOTE — Progress Notes (Signed)
Inpatient Diabetes Program Recommendations  AACE/ADA: New Consensus Statement on Inpatient Glycemic Control (2015)  Target Ranges:  Prepandial:   less than 140 mg/dL      Peak postprandial:   less than 180 mg/dL (1-2 hours)      Critically ill patients:  140 - 180 mg/dL   Lab Results  Component Value Date   GLUCAP 78 06/27/2020   HGBA1C 6.2 03/30/2020    Review of Glycemic Control Results for Keith [name], Keith [name] (MRN [id number]) as of 06/27/2020 11:10  Ref. Range 06/26/2020 23:19 06/27/2020 03:19 06/27/2020 07:20  Glucose-Capillary Latest Ref Range: 70 - 99 mg/dL 261 (H) 153 (H) 78   Diabetes history: DM 2 Outpatient Diabetes medications:  Metformin 250 mg daily Current orders for Inpatient glycemic control:  Novolog 0-20 units q 4 hours Lantus 10 units q HS  Inpatient Diabetes Program Recommendations:    Please reduce Novolog correction to sensitive tid with meals and HS.   Will follow.   Thanks,  Adah Perl, RN, BC-ADM Inpatient Diabetes Coordinator Pager [phone]

## 2020-06-27 NOTE — Progress Notes (Addendum)
NAME:  Keith [name], MRN:  [id number], DOB:  [birth year], LOS: 1 ADMISSION DATE:  06/26/2020, CONSULTATION DATE:  06/27/2020  REFERRING MD:  EDP, [place], CHIEF COMPLAINT: Chest pain, near syncope  Brief History   69 year old with chronic back pain on opiates admitted with saddle PE and obstructive shock  History of present illness   Presented with near syncope after returning from the bathroom.  Also complained of chest pain like "elephant sitting on his chest" noted to be hypoxic with saturation in the 80s, blood pressure in the 50s, brought in by EMS as a code STEMI .  Placed on nonrebreather, initial blood pressure 70s, started on Levophed, central line placed in ED, CT angiogram obtained which confirmed saddle PE Recent admission 3 weeks ago for acute on chronic back pain he is on chronic opiates for back pain for at least 2 years by neurosurgery.  He reports since his admission he has mostly been sitting around, limited movement  Past Medical History  Chronic back pain , chronic opiate use Diabetes type 2 with neuropathy Scoliosis Lumbar stenosis T11-12 laminectomy 07/2018 Recent admission 8/24-8/28 under neurosurgery for acute on chronic low back pain, numbness tingling and weakness in bilateral lower extremities >> treated with IV steroids  Significant Hospital Events   9/19 IV tpa in ED  Consults:    Procedures:    Significant Diagnostic Tests:  CT angiogram chest/abdomen/pelvis 9/19>> Extensive bilateral pulmonary emboli with saddle embolus and associated right heart strain.  Left adrenal mass?  [facility] MRI T-spine and L-spine 8/25 >> T11-12 cord compression with myelopathic findings despite interval laminectomy. L3-4 severe spinal stenosis, L4-5 high-grade spinal stenosis Echo 9/20 >  LE dupled 9/20 >   Micro Data:    Antimicrobials:     Interim history/subjective:  Much improved, "I feel a whole lot better". On 1L O2 via [place].  HD stable  Objective   Blood  pressure 110/76, pulse 80, temperature 98.6 F (37 C), temperature source Oral, resp. rate 16, height 6' (1.829 m), weight 121.1 kg, SpO2 100 %.        Intake/Output Summary (Last 24 hours) at 06/27/2020 6389 Last data filed at 06/27/2020 0700 Gross per 24 hour  Intake 1884.88 ml  Output 800 ml  Net 1084.88 ml   Filed Weights   06/26/20 2011 06/27/20 0500  Weight: 121.1 kg 121.1 kg    Examination: General: Elderly, obese man, in NAD HENT: /AT, MMM Lungs: CTAB Cardiovascular: RRR, no M/R/G Abdomen: Soft, obese, nontender Extremities: Left swollen, mild tenderness 1+ edema Neuro: Alert, interactive, nonfocal   Assessment & Plan:   Acute massive/saddle PE with hemodynamic compromise - s/p tPA in ED overnight 9/19.  Presumed provoked in setting sedentary lifestyle recently due to back pain and parasthesias (supposed to be having surgery with neurosurgery Dr. Zada Finders at some point in the near future).   - Continue heparin gtt per pharmacy. - F/u on echo, LE duplex. - Transition to oral AC and will need for 3 - 6 months minimum (likely warfarin given plans for back surgery in near future).  Acute hypoxic respiratory failure - related to above. Much improved AM 9/20. - Wean off O2 as able.  AKI. - Supportive care. - Follow BMP.  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 - Continue home oxycodone. - Hold home oxycontin for now.  Uncontrolled diabetes - likely related to steroids that he recently received - Continue SSI + lantus. - Hold  home metformin.  Hx HTN. - Hold home tenoretic, clonidine for now, can consider resume in Am 9/21.  Hx spinal stenosis - s/p T11-12 laminectomy 2019 with Dr. Zada Finders.  MRI Aug 2021 demonstrated persistent thoracic spinal stenosis and severe lumbar spinal stenosis. - F/u with Dr. Zada Finders as outpatient.   Pt much improved.  Hemodynamically stable.  On IV heparin. Stable for transfer out of ICU.  Will  ask TRH to assume care starting AM 9/21 with PCCM off at that time.  Best practice:  Diet: Heart healthy Pain/Anxiety/Delirium protocol (if indicated): Oxycodone VAP protocol (if indicated): N/A DVT prophylaxis: IV heparin GI prophylaxis: N/A Glucose control: Lantus/SSI Mobility: Bedrest Code Status: Full Family Communication: None available Disposition: Tx to progressive.  CC time: 30 min.   Montey Hora, [place] [facility] 06/27/2020, 63:55 AM   69 year old gentleman admitted for massive saddle PE with hemodynamic compromise given TPA.  Patient tolerated well.  Hemodynamically stable now.  Having follow-up echo this morning.  BP 100/71   Pulse 83   Temp 98.6 F (37 C) (Oral)   Resp (!) 22   Ht 6' (1.829 m)   Wt 121.1 kg   SpO2 99%   BMI 36.21 kg/m   General: Obese male resting comfortably in bed on nasal cannula Heart: Regular rhythm S1-S2 Lungs: Clear to auscultation bilaterally no crackles no wheeze Abdomen: Soft nontender nondistended Extremities: Trace edema.  Labs: Reviewed hemoglobin stable  A:  Acute massive saddle PE Acute hypoxemic respiratory failure now on nasal cannula secondary to above. AKI Diabetes Hypertension Spinal stenosis with planned neurosurgical intervention.  Plan: Continue heparin per pharmacy Follow-up echo results Lower extremity Dopplers did show DVT.  Continue anticoagulation. Holding home blood pressure medications at this time. Continue home oxycodone for pain control.  Patient stable for transfer from the intensive care unit.  Garner Nash, DO [facility] 06/27/2020 11:43 AM

## 2020-06-27 NOTE — Progress Notes (Signed)
ANTICOAGULATION CONSULT NOTE - Initial Consult  Pharmacy Consult for heparin Indication: pulmonary embolus   Assessment: 33 YOM who presents with massive PE and saddle embolus and associated R heart strain. Patient to receive systemic thrombolysis with IV tPA. Pharmacy consulted to start IV heparin after tPA has finished infusing APTT 72 sec   Goal of Therapy:  Heparin level 0.3-0.5 units/ml x 24 hours post tPA, then increase to 0.3 - 0.7 units/mL  Monitor platelets by anticoagulation protocol: Yes   Will start heparin at 1400 units/hr.  Check heparin level in 6 hours  Thanks for allowing pharmacy to be a part of this patient's care.  Excell Seltzer, PharmD Clinical Pharmacist

## 2020-06-27 NOTE — Progress Notes (Signed)
ANTICOAGULATION CONSULT NOTE - Initial Consult  Pharmacy Consult for heparin Indication: pulmonary embolus   Assessment: 79 YOM who presents with massive PE and saddle embolus and associated R heart strain. Patient to receive systemic thrombolysis with IV tPA. Pharmacy consulted to start IV heparin post tPA.  tPA finished ~0100 9/20  Heparin started overnight. Initial heparin just above lower goal with recent tPA. No issues noted. CBC stable.   Heparin level came back therapeutic tonight. We will keep it at the lower end of goal tonight until 0100. Standard goal will be used moving forward tomorrow.   Goal of Therapy:  Heparin level 0.3-0.5 units/ml x 24 hours post tPA, then increase to 0.3 - 0.7 units/mL  Monitor platelets by anticoagulation protocol: Yes   Continue heparin at 1250 units/hr Daily heparin level/CBC  Onnie Boer, PharmD, BCIDP, AAHIVP, CPP Infectious Disease Pharmacist 06/27/2020 8:33 PM

## 2020-06-27 NOTE — Progress Notes (Signed)
ANTICOAGULATION CONSULT NOTE - Initial Consult  Pharmacy Consult for heparin Indication: pulmonary embolus   Assessment: 61 YOM who presents with massive PE and saddle embolus and associated R heart strain. Patient to receive systemic thrombolysis with IV tPA. Pharmacy consulted to start IV heparin post tPA.  tPA finished ~0100 9/20  Heparin started overnight. Initial heparin just above lower goal with recent tPA. No issues noted. CBC stable.    Goal of Therapy:  Heparin level 0.3-0.5 units/ml x 24 hours post tPA, then increase to 0.3 - 0.7 units/mL  Monitor platelets by anticoagulation protocol: Yes   Will decrease heparin to 1250 units/hr.  Check heparin level in 6 hours  Thanks for allowing pharmacy to be a part of this patient's care.  Erin Hearing PharmD., BCPS Clinical Pharmacist 06/27/2020 9:33 AM

## 2020-06-27 NOTE — Progress Notes (Signed)
  Echocardiogram 2D Echocardiogram has been performed.  Keith [name] 06/27/2020, 10:45 AM

## 2020-06-28 ENCOUNTER — Inpatient Hospital Stay (HOSPITAL_COMMUNITY): Payer: Medicare Other

## 2020-06-28 ENCOUNTER — Encounter (HOSPITAL_COMMUNITY): Payer: Self-pay | Admitting: Pulmonary Disease

## 2020-06-28 DIAGNOSIS — N179 Acute kidney failure, unspecified: Secondary | ICD-10-CM

## 2020-06-28 LAB — CBC
HCT: 30.5 % — ABNORMAL LOW (ref 39.0–52.0)
Hemoglobin: 9.8 g/dL — ABNORMAL LOW (ref 13.0–17.0)
MCH: 30.4 pg (ref 26.0–34.0)
MCHC: 32.1 g/dL (ref 30.0–36.0)
MCV: 94.7 fL (ref 80.0–100.0)
Platelets: 259 10*3/uL (ref 150–400)
RBC: 3.22 MIL/uL — ABNORMAL LOW (ref 4.22–5.81)
RDW: 15.3 % (ref 11.5–15.5)
WBC: 7.7 10*3/uL (ref 4.0–10.5)
nRBC: 0 % (ref 0.0–0.2)

## 2020-06-28 LAB — BASIC METABOLIC PANEL
Anion gap: 11 (ref 5–15)
BUN: 19 mg/dL (ref 8–23)
CO2: 23 mmol/L (ref 22–32)
Calcium: 9.1 mg/dL (ref 8.9–10.3)
Chloride: 105 mmol/L (ref 98–111)
Creatinine, Ser: 1.32 mg/dL — ABNORMAL HIGH (ref 0.61–1.24)
GFR calc Af Amer: >60 mL/min (ref >60)
GFR calc non Af Amer: 55 mL/min — ABNORMAL LOW (ref >60)
Glucose, Bld: 156 mg/dL — ABNORMAL HIGH (ref 70–99)
Potassium: 3.4 mmol/L — ABNORMAL LOW (ref 3.5–5.1)
Sodium: 139 mmol/L (ref 135–145)

## 2020-06-28 LAB — GLUCOSE, CAPILLARY
Glucose-Capillary: 165 mg/dL — ABNORMAL HIGH (ref 70–99)
Glucose-Capillary: 140 mg/dL — ABNORMAL HIGH (ref 70–99)
Glucose-Capillary: 122 mg/dL — ABNORMAL HIGH (ref 70–99)
Glucose-Capillary: 132 mg/dL — ABNORMAL HIGH (ref 70–99)

## 2020-06-28 LAB — MAGNESIUM: Magnesium: 2 mg/dL (ref 1.7–2.4)

## 2020-06-28 LAB — HEPARIN LEVEL (UNFRACTIONATED): Heparin Unfractionated: 0.36 IU/mL (ref 0.30–0.70)

## 2020-06-28 LAB — PHOSPHORUS: Phosphorus: 2.3 mg/dL — ABNORMAL LOW (ref 2.5–4.6)

## 2020-06-28 MED ORDER — MORPHINE SULFATE (PF) 2 MG/ML IV SOLN
2.0000 mg | Freq: Once | INTRAVENOUS | Status: AC
  Administered 2020-06-28: 2 mg via INTRAVENOUS
  Filled 2020-06-28: qty 1

## 2020-06-28 MED ORDER — MORPHINE SULFATE (PF) 2 MG/ML IV SOLN
2.0000 mg | Freq: Once | INTRAVENOUS | Status: AC | PRN
  Administered 2020-06-29: 2 mg via INTRAVENOUS
  Filled 2020-06-28: qty 1

## 2020-06-28 NOTE — Progress Notes (Addendum)
   06/28/20 0525  What Happened  Was fall witnessed? Yes  Who witnessed fall? Santina Evans NT  Patients activity before fall bathroom-assisted  Point of contact buttocks  Was patient injured? No  Follow Up  MD notified Sommer MD  Time MD notified [phone]  Family notified No - patient refusal  Additional tests No  Progress note created (see row info) Yes  Adult Fall Risk Assessment  Risk Factor Category (scoring not indicated) High fall risk per protocol (document High fall risk)  Patient Fall Risk Level High fall risk  Adult Fall Risk Interventions  Required Bundle Interventions *See Row Information* High fall risk - low, moderate, and high requirements implemented  Additional Interventions Use of appropriate toileting equipment (bedpan, BSC, etc.);Room near nurses station  Screening for Fall Injury Risk (To be completed on HIGH fall risk patients) - Assessing Need for Low Bed  Risk For Fall Injury- Low Bed Criteria None identified - Continue screening  Screening for Fall Injury Risk (To be completed on HIGH fall risk patients who do not meet crieteria for Low Bed) - Assessing Need for Floor Mats Only  Risk For Fall Injury- Criteria for Floor Mats None identified - No additional interventions needed  Vitals  Temp 98.2 F (36.8 C)  Temp Source Oral  BP 121/68  MAP (mmHg) 79  BP Location Left Arm  BP Method Automatic  Patient Position (if appropriate) Lying  Pulse Rate 87  Pulse Rate Source Monitor  ECG Heart Rate 87  Resp 20  Oxygen Therapy  SpO2 100 %  Pain Assessment  Pain Scale 0-10  Pain Score 4  Pain Type Chronic pain  Pain Location Back  Pain Orientation Mid;Lower  Pain Frequency Intermittent  Pain Intervention(s) Refused  Neurological  Neuro (WDL) WDL   Pt was being assisted to bathroom by NT, had incontinent episode of urine and slipped while NT was cleaning the floor. Due to previous crush injury of L foot many years ago, patient was unable to stand up from  current position and a hoyer lift was used to assist patient back to bed.  Vitals above. Pt does not complain of pain other than normal chronic back pain. Landed on butt- no bruising or injury noted. Elink MD notified- no new orders. Pt notified wife himself over the phone. High fall risk protocol started. Will continue to monitor.  Jaymes Graff, RN

## 2020-06-28 NOTE — Evaluation (Signed)
Physical Therapy Evaluation Patient Details Name: Keith [name] MRN: [id number] DOB: 1950/12/18 Today's Date: 06/28/2020   History of Present Illness  69 yo admitted with near syncope after going to the bathroom. Pt with saddle PE with obstructive shock s/p tPA and heparin. 9/21 pt with slip on wet floor while standing with right ankle pain. PMhx: chronic back pain, opiate use, DM, scoliosis, T11-12 lami  Clinical Impression  Pt frustrated stating he retired in Oct 2019 and that his back issues began one week after retirement and between surgeries, covid and now PE he hasn't been able to mobilize effectively or enjoy retirement. Pt with fall on wet floor this am which sounds like he rotated right ankle as he was slipping with pt currently with pain to medial/lateral compression and weight bearing unable to stand with RN aware. Pt with decreased functional mobility, transfers and pain limiting ability to return home and care for himself. Pt will benefit from acute therapy to maximize function and independence in hope that ankle pain will improve and allow gait during admission. If pt unable to stand and walk then SNF recommended.   VSS throughout with pt on RA    Follow Up Recommendations Home health PT;Supervision/Assistance - 24 hour;SNF (pending mobility progression)    Equipment Recommendations  None recommended by PT    Recommendations for Other Services       Precautions / Restrictions Precautions Precautions: Fall Precaution Comments: Rt ankle pain edema      Mobility  Bed Mobility Overal bed mobility: Needs Assistance Bed Mobility: Rolling;Sidelying to Sit;Sit to Supine Rolling: Min guard Sidelying to sit: Min guard   Sit to supine: Min guard   General bed mobility comments: pt with HOB 25 degrees and able to roll to right with use of rail then transition to sitting. return to supine without physical assist and increased time  Transfers                  General transfer comment: attempted to stand with right foot/knee blocked from elevated surface but pt unable to tolerate weight through right ankle to stand with return to bed  Ambulation/Gait                Stairs            Wheelchair Mobility    Modified Rankin (Stroke Patients Only)       Balance Overall balance assessment: Needs assistance   Sitting balance-Leahy Scale: Good Sitting balance - Comments: pt able to sit EOB without assist                                     Pertinent Vitals/Pain Pain Assessment: 0-10 Pain Score: 6  Pain Location: right ankle with compression or weight bearing Pain Descriptors / Indicators: Aching;Guarding Pain Intervention(s): Limited activity within patient's tolerance;Monitored during session;Repositioned    Home Living Family/patient expects to be discharged to:: Private residence Living Arrangements: Spouse/significant other Available Help at Discharge: Family;Available 24 hours/day Type of Home: House Home Access: Stairs to enter   [employer] of Steps: 2 Home Layout: Two level Home Equipment: Walker - 4 wheels;Cane - single point;Grab bars - toilet;Grab bars - tub/shower;Bedside commode;Adaptive equipment      Prior Function Level of Independence: Independent with assistive device(s)         Comments: rollator     Hand Dominance  Extremity/Trunk Assessment   Upper Extremity Assessment Upper Extremity Assessment: Overall WFL for tasks assessed    Lower Extremity Assessment Lower Extremity Assessment: RLE deficits/detail;LLE deficits/detail RLE Deficits / Details: right ankle pain with compression and weight bearing with some edema LLE Deficits / Details: 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       Communication   Communication: No difficulties  Cognition Arousal/Alertness: Awake/alert Behavior During Therapy: WFL for tasks  assessed/performed Overall Cognitive Status: Within Functional Limits for tasks assessed                                        General Comments      Exercises     Assessment/Plan    PT Assessment Patient needs continued PT services  PT Problem List Decreased mobility;Decreased safety awareness;Decreased activity tolerance;Decreased range of motion;Pain       PT Treatment Interventions DME instruction;Therapeutic exercise;Gait training;Functional mobility training;Therapeutic activities;Patient/family education;Stair training;Balance training    PT Goals (Current goals can be found in the Care Plan section)  Acute Rehab PT Goals Patient Stated Goal: be able to walk and get my back fixed PT Goal Formulation: With patient Time For Goal Achievement: 07/12/20 Potential to Achieve Goals: Fair    Frequency Min 3X/week   Barriers to discharge        Co-evaluation               AM-PAC PT "6 Clicks" Mobility  Outcome Measure Help needed turning from your back to your side while in a flat bed without using bedrails?: A Little Help needed moving from lying on your back to sitting on the side of a flat bed without using bedrails?: A Little Help needed moving to and from a bed to a chair (including a wheelchair)?: A Lot Help needed standing up from a chair using your arms (e.g., wheelchair or bedside chair)?: Total Help needed to walk in hospital room?: Total Help needed climbing 3-5 steps with a railing? : Total 6 Click Score: 11    End of Session Equipment Utilized During Treatment: Gait belt Activity Tolerance: Patient limited by pain Patient left: in bed;with call bell/phone within reach Nurse Communication: Mobility status PT Visit Diagnosis: Other abnormalities of gait and mobility (R26.89);Difficulty in walking, not elsewhere classified (R26.2);Muscle weakness (generalized) (M62.81);Pain Pain - Right/Left: Right Pain - part of body: Ankle and joints  of foot    Time: 1306-1330 PT Time Calculation (min) (ACUTE ONLY): 24 min   Charges:   PT Evaluation $PT Eval Moderate Complexity: 1 Mod          [place], PT Acute Rehabilitation Services Pager: [phone] Office: [phone]   Josephine Rudnick B Shyam Dawson 06/28/2020, 2:02 PM

## 2020-06-28 NOTE — Progress Notes (Signed)
Floor coverage  Patient admitted on 9/19 for acute hypoxic respiratory failure secondary to acute massive/saddle PE with hemodynamic compromise and near syncope.  Status post TPA and currently on IV heparin.  Left lower extremity DVT noted on ultrasound.  Notified by patient's nurse that he had a witnessed fall early this morning where he slipped and injured his right foot/ankle.  No head injury or other injuries reported.  Patient has been experiencing right ankle and foot pain all day.   X-ray of right foot/ankle showing: "IMPRESSION: 1. Trimalleolar fracture as above. 2. Dorsal subluxation of the talus relative to the tibial plafond. 3. Small avulsion fracture dorsal distal margin of the talus. 4. Diffuse soft tissue edema."  -I have discussed the case with Dr. Mardelle Matte, orthopedics will do a closed reduction tonight.  He does need ankle surgery, however, timing of surgery will likely be delayed as he needs anticoagulation at this time given acute massive/saddle PE. -Continue pain management

## 2020-06-28 NOTE — TOC Benefit Eligibility Note (Signed)
Transition of Care [facility]) Benefit Eligibility Note    Patient Details  Name: Keith [name] MRN: [id number] Date of Birth: [birth year]   Medication/Dose: Arne Cleveland 2.5 MG BID  and   ELIQUIS  5 MG BID  Covered?: Yes  Tier: 3 Drug  Prescription Coverage Preferred Pharmacy: [facility]  Spoke with Person/Company/Phone Number:: [employer]  @   [facility] [employer] # [phone] OPT- 2  Co-Pay: $65.25  Prior Approval: No  Deductible: Met  Additional Notes: XARELTO 20 MG DAILY COVER- YES CO-PAY- $63.54  TIER- 3 DRUG P/A-NO     XARELTO 15 MG BID COVER- YES CO-PAY- $193.15 TIER- 3 DRUG P/A NO    Memory Argue Phone Number: 06/28/2020, 10:32 AM

## 2020-06-28 NOTE — Progress Notes (Signed)
PROGRESS NOTE    Keith [name]  DGL:[id number] DOB: 10-24-1950 DOA: 06/26/2020 PCP: [name], [name], MD   Brief Narrative:  Pt presented with near syncope after returning from the bathroom.  Also complained of chest pain like "elephant sitting on his chest" noted to be hypoxic with saturation in the 80s, blood pressure in the 50s, brought in by EMS as a code STEMI .  Placed on nonrebreather, initial blood pressure 70s, started on Levophed, central line placed in ED, CT angiogram obtained which confirmed saddle PE. Recent admission 3 weeks ago for acute on chronic back pain he is on chronic opiates for back pain for at least 2 years by neurosurgery.  He reports since his admission he has mostly been sitting around, limited movement  Patient admitted as above with atypical chest pain hypoxia found to have massive saddle PE as well as left lower extremity DVT likely provoked in the setting of poor ambulatory status and prolonged sedentary lifestyle.  Patient tolerated TPA quite well, now on heparin drip likely transition over to p.o. anticoagulation the next 24 to 48 hours pending clinical course.   Assessment & Plan:   Active Problems:   AKI (acute kidney injury) ([place])   Pulmonary embolism (HCC)   Acute respiratory failure with hypoxia (HCC)   Acute massive/saddle PE with hemodynamic compromise and near syncope, POA - Status post TPA followed by IV heparin  - 2D echo - without overt findings as below - Left lower extremity DVT noted on ultrasound - BP improving after lytics - continue holding home meds  Acute hypoxic respiratory failure -related to above, titrate oxygen as needed - Ambulatory oxygen screen daily, wean as tolerated SpO2: 100 % O2 Flow Rate (L/min): 1 L/min  AKI, improving - Related to above, continue to advance diet, increase p.o. intake as tolerated Lab Results  Component Value Date   CREATININE 1.32 (H) 06/28/2020   CREATININE 1.95 (H) 06/27/2020    CREATININE 1.70 (H) 06/26/2020   Chronic opiate use - Resume oxycodone - Holding OxyContin until clinical improvement and course better defined.  Uncontrolled diabetes -likely related to steroids that he recently received - Start Lantus 10 units - SSI resistant scale - A1C 6.7  DVT prophylaxis: Heparin drip as above Code Status: Full Family Communication: Wife at bedside  Status is: Inpatient  Dispo: The patient is from: Home              Anticipated d/c is to: To be determined pending clinical course and ambulatory status              Anticipated d/c date is: 24 to 48 hours pending clinical course              Patient currently not medically stable for discharge given ongoing need for IV anticoagulation in the setting of saddle PE, disposition pending advancement of anticoagulation to p.o. as well as safe discharge location.  Consultants:   PCCM  Procedures:   TPA administration as above  Antimicrobials:  None  Subjective: No acute issues or events overnight, patient somewhat frustrated as he is concerned about his ambulatory status we explained he was essentially bedbound given TPA protocol.  He otherwise denies nausea, vomiting, diarrhea, constipation, headache, fevers, chills.  Objective: Vitals:   06/27/20 2055 06/28/20 0015 06/28/20 0503 06/28/20 0525  BP: 110/68 108/64 114/71 121/68  Pulse: 82   87  Resp: 16 15 18 20   Temp: 98.1 F (36.7 C) 98.2 F (36.8 C) 98.8  F (37.1 C) 98.2 F (36.8 C)  TempSrc: Oral Oral Oral Oral  SpO2: 97% 98%  100%  Weight:   120.1 kg   Height:        Intake/Output Summary (Last 24 hours) at 06/28/2020 0701 Last data filed at 06/28/2020 0100 Gross per 24 hour  Intake 218.05 ml  Output 720 ml  Net -501.95 ml   Filed Weights   06/26/20 2011 06/27/20 0500 06/28/20 0503  Weight: 121.1 kg 121.1 kg 120.1 kg    Examination:  General exam: Appears calm and comfortable  Respiratory system: Clear to auscultation. Respiratory  effort normal. Cardiovascular system: S1 & S2 heard, RRR. No JVD, murmurs, rubs, gallops or clicks. No pedal edema. Gastrointestinal system: Abdomen is nondistended, soft and nontender. No organomegaly or masses felt. Normal bowel sounds heard. Central nervous system: Alert and oriented. No focal neurological deficits. Extremities: Symmetric 5 x 5 power. Skin: No rashes, lesions or ulcers   Data Reviewed: I have personally reviewed following labs and imaging studies  CBC: Recent Labs  Lab 06/26/20 2001 06/26/20 2001 06/26/20 2006 06/26/20 2153 06/27/20 0101 06/27/20 0419 06/28/20 0207  WBC 8.6  --   --  13.9* 12.0*  --  7.7  NEUTROABS 4.5  --   --   --   --   --   --   HGB 12.2*   < > 13.3 10.6* 10.7* 10.5* 9.8*  HCT 40.7   < > 39.0 34.6* 33.7* 31.0* 30.5*  MCV 101.2*  --   --  99.7 96.6  --  94.7  PLT 353  --   --  355 289  --  259   < > = values in this interval not displayed.   Basic Metabolic Panel: Recent Labs  Lab 06/26/20 2001 06/26/20 2006 06/27/20 0101 06/27/20 0419 06/28/20 0207  NA 141 142 138 141 139  K 3.7 3.6 4.3 4.1 3.4*  CL 105 108 105  --  105  CO2 18*  --  22  --  23  GLUCOSE 328* 318* 204*  --  156*  BUN 24* 27* 27*  --  19  CREATININE 2.00* 1.70* 1.95*  --  1.32*  CALCIUM 9.9  --  9.0  --  9.1  MG  --   --  2.3  --  2.0  PHOS  --   --  4.6  --  2.3*   GFR: Estimated Creatinine Clearance: 72.7 mL/min (A) (by C-G formula based on SCr of 1.32 mg/dL (H)). Liver Function Tests: Recent Labs  Lab 06/26/20 2001  AST 98*  ALT 56*  ALKPHOS 88  BILITOT 0.8  PROT 7.7  ALBUMIN 3.5   No results for input(s): LIPASE, AMYLASE in the last 168 hours. No results for input(s): AMMONIA in the last 168 hours. Coagulation Profile: Recent Labs  Lab 06/26/20 2001  INR 1.1   Cardiac Enzymes: No results for input(s): CKTOTAL, CKMB, CKMBINDEX, TROPONINI in the last 168 hours. BNP (last 3 results) No results for input(s): PROBNP in the last 8760  hours. HbA1C: Recent Labs    06/27/20 2000  HGBA1C 6.7*   CBG: Recent Labs  Lab 06/27/20 0720 06/27/20 1109 06/27/20 1511 06/27/20 2104 06/28/20 0610  GLUCAP 78 94 108* 167* 165*   Lipid Profile: No results for input(s): CHOL, HDL, LDLCALC, TRIG, CHOLHDL, LDLDIRECT in the last 72 hours. Thyroid Function Tests: No results for input(s): TSH, T4TOTAL, FREET4, T3FREE, THYROIDAB in the last 72 hours. Anemia Panel: No results  for input(s): VITAMINB12, FOLATE, FERRITIN, TIBC, IRON, RETICCTPCT in the last 72 hours. Sepsis Labs: Recent Labs  Lab 06/26/20 2124  LATICACIDVEN 2.1*    Recent Results (from the past 240 hour(s))  SARS Coronavirus 2 by RT PCR (hospital order, performed in [facility] hospital lab) Nasopharyngeal Nasopharyngeal Swab     Status: None   Collection Time: 06/26/20  9:10 PM   Specimen: Nasopharyngeal Swab  Result Value Ref Range Status   SARS Coronavirus 2 NEGATIVE NEGATIVE Final    Comment: (NOTE) SARS-CoV-2 target nucleic acids are NOT DETECTED.  The SARS-CoV-2 RNA is generally detectable in upper and lower respiratory specimens during the acute phase of infection. The lowest concentration of SARS-CoV-2 viral copies this assay can detect is 250 copies / mL. A negative result does not preclude SARS-CoV-2 infection and should not be used as the sole basis for treatment or other patient management decisions.  A negative result may occur with improper specimen collection / handling, submission of specimen other than nasopharyngeal swab, presence of viral mutation(s) within the areas targeted by this assay, and inadequate number of viral copies (<250 copies / mL). A negative result must be combined with clinical observations, patient history, and epidemiological information.  Fact Sheet for Patients:   StrictlyIdeas.no  Fact Sheet for Healthcare Providers: BankingDealers.co.za  This test is not yet  approved or  cleared by the [place] FDA and has been authorized for detection and/or diagnosis of SARS-CoV-2 by FDA under an Emergency Use Authorization (EUA).  This EUA will remain in effect (meaning this test can be used) for the duration of the COVID-19 declaration under Section 564(b)(1) of the Act, 21 U.S.C. section 360bbb-3(b)(1), unless the authorization is terminated or revoked sooner.  Performed at [facility], [place] [address], [place], [place] [address]   Culture, blood (routine x 2)     Status: None (Preliminary result)   Collection Time: 06/26/20  9:33 PM   Specimen: BLOOD  Result Value Ref Range Status   Specimen Description BLOOD CENTRAL LINE  Final   Special Requests   Final    BOTTLES DRAWN AEROBIC AND ANAEROBIC Blood Culture adequate volume   Culture   Final    NO GROWTH < 24 HOURS Performed at [facility], [place] [address] [place], [place] [address]    Report Status PENDING  Incomplete  MRSA PCR Screening     Status: None   Collection Time: 06/26/20 11:30 PM   Specimen: Nasopharyngeal  Result Value Ref Range Status   MRSA by PCR NEGATIVE NEGATIVE Final    Comment:        The GeneXpert MRSA Assay (FDA approved for NASAL specimens only), is one component of a comprehensive MRSA colonization surveillance program. It is not intended to diagnose MRSA infection nor to guide or monitor treatment for MRSA infections. Performed at [facility], [place] [address], [place],  [address]          Radiology Studies: DG Chest Portable 1 View  Result Date: 06/26/2020 CLINICAL DATA:  Chest pain, known pulmonary emboli EXAM: PORTABLE CHEST 1 VIEW COMPARISON:  06/26/2020 FINDINGS: Cardiac shadow is stable. Right jugular central line is again noted and stable. No pneumothorax is noted. The lungs are clear. IMPRESSION: No acute abnormality noted. Electronically Signed   By: Inez Catalina M.D.   On: 06/26/2020 22:30   DG Chest Portable 1  View  Result Date: 06/26/2020 CLINICAL DATA:  Check central line placement EXAM:  PORTABLE CHEST 1 VIEW COMPARISON:  08/29/2014, CT from earlier in the same day. FINDINGS: Cardiac shadow is mildly prominent but stable. Right jugular central line is noted at the cavoatrial junction. No pneumothorax is seen. The overall inspiratory effort is poor although no focal infiltrate is seen. No bony abnormality is noted. IMPRESSION: No pneumothorax following central line placement as described. Electronically Signed   By: Inez Catalina M.D.   On: 06/26/2020 21:36   ECHOCARDIOGRAM COMPLETE  Result Date: 06/27/2020    ECHOCARDIOGRAM REPORT   Patient Name:   Keith [name] Date of Exam: 06/27/2020 Medical Rec #:  [id number]        Height:       72.0 in Accession #:    [id number]       Weight:       267.0 lb Date of Birth:  [birth year]       BSA:          2.409 m Patient Age:    69 years         BP:           118/88 mmHg Patient Gender: M                HR:           81 bpm. Exam Location:  Inpatient Procedure: 2D Echo Indications:    pulmonary embolus 415.19  History:        Patient has no prior history of Echocardiogram examinations.                 Risk Factors:Diabetes and Hypertension.  Sonographer:    Johny Chess Referring Phys: [id number] LAURA R GLEASON  Sonographer Comments: Image acquisition challenging due to patient body habitus. IMPRESSIONS  1. Left ventricular ejection fraction, by estimation, is 60 to 65%. The left ventricle has normal function. The left ventricle has no regional wall motion abnormalities. There is mild concentric left ventricular hypertrophy. Left ventricular diastolic parameters are consistent with Grade I diastolic dysfunction (impaired relaxation). There is the interventricular septum is flattened in systole and diastole, consistent with right ventricular pressure and volume overload.  2. Right ventricular systolic function is moderately reduced. The right ventricular size is moderately  enlarged. There is moderately elevated pulmonary artery systolic pressure. The estimated right ventricular systolic pressure is 70.1 mmHg.  3. The mitral valve is normal in structure. No evidence of mitral valve regurgitation. No evidence of mitral stenosis.  4. The aortic valve is tricuspid. Aortic valve regurgitation is not visualized. Mild aortic valve sclerosis is present, with no evidence of aortic valve stenosis.  5. The inferior vena cava is dilated in size with <50% respiratory variability, suggesting right atrial pressure of 15 mmHg. FINDINGS  Left Ventricle: Left ventricular ejection fraction, by estimation, is 60 to 65%. The left ventricle has normal function. The left ventricle has no regional wall motion abnormalities. The left ventricular internal cavity size was normal in size. There is  mild concentric left ventricular hypertrophy. The interventricular septum is flattened in systole and diastole, consistent with right ventricular pressure and volume overload. Left ventricular diastolic parameters are consistent with Grade I diastolic dysfunction (impaired relaxation). Normal left ventricular filling pressure. Right Ventricle: The right ventricular size is moderately enlarged. No increase in right ventricular wall thickness. Right ventricular systolic function is moderately reduced. There is moderately elevated pulmonary artery systolic pressure. The tricuspid  regurgitant velocity is 2.55 m/s, and with an assumed right atrial pressure of 15 mmHg,  the estimated right ventricular systolic pressure is 08.6 mmHg. Left Atrium: Left atrial size was normal in size. Right Atrium: Right atrial size was normal in size. Pericardium: There is no evidence of pericardial effusion. Mitral Valve: The mitral valve is normal in structure. No evidence of mitral valve regurgitation. No evidence of mitral valve stenosis. Tricuspid Valve: The tricuspid valve is normal in structure. Tricuspid valve regurgitation is mild .  No evidence of tricuspid stenosis. Aortic Valve: The aortic valve is tricuspid. Aortic valve regurgitation is not visualized. Mild aortic valve sclerosis is present, with no evidence of aortic valve stenosis. Pulmonic Valve: The pulmonic valve was normal in structure. Pulmonic valve regurgitation is trivial. No evidence of pulmonic stenosis. Aorta: The aortic root is normal in size and structure. Venous: The inferior vena cava is dilated in size with less than 50% respiratory variability, suggesting right atrial pressure of 15 mmHg. IAS/Shunts: No atrial level shunt detected by color flow Doppler.  LEFT VENTRICLE PLAX 2D LVIDd:         4.00 cm  Diastology LVIDs:         3.10 cm  LV e' medial:    5.98 cm/s LV PW:         1.30 cm  LV E/e' medial:  9.5 LV IVS:        1.40 cm  LV e' lateral:   6.20 cm/s LVOT diam:     2.00 cm  LV E/e' lateral: 9.1 LV SV:         47 LV SV Index:   20 LVOT Area:     3.14 cm  RIGHT VENTRICLE             IVC RV Basal diam:  4.71 cm     IVC diam: 2.30 cm RV S prime:     11.70 cm/s LEFT ATRIUM             Index       RIGHT ATRIUM           Index LA diam:        3.50 cm 1.45 cm/m  RA Area:     14.20 cm LA Vol (A2C):   45.8 ml 19.01 ml/m RA Volume:   33.10 ml  13.74 ml/m LA Vol (A4C):   40.1 ml 16.65 ml/m LA Biplane Vol: 43.2 ml 17.93 ml/m  AORTIC VALVE LVOT Vmax:   93.30 cm/s LVOT Vmean:  59.200 cm/s LVOT VTI:    0.150 m  AORTA Ao Root diam: 3.50 cm Ao Asc diam:  3.50 cm MV E velocity: 56.60 cm/s  TRICUSPID VALVE MV A velocity: 74.10 cm/s  TR Peak grad:   26.0 mmHg MV E/A ratio:  0.76        TR Vmax:        255.00 cm/s                             SHUNTS                            Systemic VTI:  0.15 m                            Systemic Diam: 2.00 cm Fransico Him MD Electronically signed by Fransico Him MD Signature Date/Time: 06/27/2020/11:01:08 AM    Final    CT Angio Chest/Abd/Pel for  Dissection W and/or Wo Contrast  Result Date: 06/26/2020 CLINICAL DATA:  Abdominal pain, chest  pain and nausea. EXAM: CT ANGIOGRAPHY CHEST, ABDOMEN AND PELVIS TECHNIQUE: Non-contrast CT of the chest was initially obtained. Multidetector CT imaging through the chest, abdomen and pelvis was performed using the standard protocol during bolus administration of intravenous contrast. Multiplanar reconstructed images and MIPs were obtained and reviewed to evaluate the vascular anatomy. CONTRAST:  142mL OMNIPAQUE IOHEXOL 350 MG/ML SOLN COMPARISON:  January 19, 2012 FINDINGS: CTA CHEST FINDINGS Cardiovascular: An extensive amount of intraluminal low attenuation is seen involving numerous right middle lobe, upper lobe and lower lobe branches of the bilateral pulmonary arteries. Saddle embolus is also seen. There is mild cardiomegaly with enlargement of the right ventricle noted. No pericardial effusion. Mediastinum/Nodes: No enlarged mediastinal, hilar, or axillary lymph nodes. Thyroid gland, trachea, and esophagus demonstrate no significant findings. Lungs/Pleura: Lungs are clear. No pleural effusion or pneumothorax. Musculoskeletal: Marked severity multilevel degenerative changes seen throughout the thoracic spine with marked severity scoliosis. Review of the MIP images confirms the above findings. CTA ABDOMEN AND PELVIS FINDINGS VASCULAR Aorta: Normal caliber aorta without aneurysm, dissection, vasculitis or significant stenosis. Celiac: Patent without evidence of aneurysm, dissection, vasculitis or significant stenosis. SMA: Patent without evidence of aneurysm, dissection, vasculitis or significant stenosis. Renals: Both renal arteries are patent without evidence of aneurysm, dissection, vasculitis, fibromuscular dysplasia or significant stenosis. IMA: Patent without evidence of aneurysm, dissection, vasculitis or significant stenosis. Inflow: Patent without evidence of aneurysm, dissection, vasculitis or significant stenosis. Veins: No obvious venous abnormality within the limitations of this arterial phase study.  Review of the MIP images confirms the above findings. NON-VASCULAR Hepatobiliary: No focal liver abnormality is seen. No gallstones, gallbladder wall thickening, or biliary dilatation. Pancreas: Unremarkable. No pancreatic ductal dilatation or surrounding inflammatory changes. Spleen: Normal in size without focal abnormality. Adrenals/Urinary Tract: There is mild diffuse enlargement of the bilateral adrenal glands. A 2.2 cm x 1.9 cm low-attenuation left adrenal mass is also seen. Kidneys are normal in size, without renal calculi or hydronephrosis. A 1.9 cm diameter simple cyst is seen within the posterolateral aspect of the mid left kidney. Bladder is unremarkable. Stomach/Bowel: There is a small hiatal hernia. Appendix appears normal. No evidence of bowel wall thickening, distention, or inflammatory changes. Noninflamed diverticula are seen throughout the sigmoid colon. Lymphatic: No abnormal abdominal or pelvic lymph nodes are seen. Reproductive: Prostate is unremarkable. Other: There is a 2.5 cm x 2.2 cm fat containing umbilical hernia. No abdominopelvic ascites. Musculoskeletal: Marked severity multilevel degenerative changes seen throughout the lumbar spine with marked severity scoliosis. Review of the MIP images confirms the above findings. IMPRESSION: 1. Extensive bilateral pulmonary emboli with saddle embolus and associated right heart strain. 2. 2.2 cm x 1.9 cm low-attenuation left adrenal mass which may represent an adrenal adenoma. 3. Noninflamed sigmoid diverticulosis. 4. Marked severity multilevel degenerative changes throughout the thoracic and lumbar spine with marked severity scoliosis. Electronically Signed   By: Virgina Norfolk M.D.   On: 06/26/2020 20:47   VAS [place] LOWER EXTREMITY VENOUS (DVT)  Result Date: 06/27/2020  Lower Venous DVTStudy Indications: Pulmonary embolism.  Comparison Study: No prior study on file Performing Technologist: Sharion Dove RVS  Examination Guidelines: A complete  evaluation includes B-mode imaging, spectral Doppler, color Doppler, and power Doppler as needed of all accessible portions of each vessel. Bilateral testing is considered an integral part of a complete examination. Limited examinations for reoccurring indications may be performed as noted. The reflux portion  of the exam is performed with the patient in reverse Trendelenburg.  +---------+---------------+---------+-----------+----------+--------------+ RIGHT    CompressibilityPhasicitySpontaneityPropertiesThrombus Aging +---------+---------------+---------+-----------+----------+--------------+ CFV      Full           Yes      Yes                                 +---------+---------------+---------+-----------+----------+--------------+ SFJ      Full                                                        +---------+---------------+---------+-----------+----------+--------------+ FV Prox  Full                                                        +---------+---------------+---------+-----------+----------+--------------+ FV Mid   Full                                                        +---------+---------------+---------+-----------+----------+--------------+ FV DistalFull                                                        +---------+---------------+---------+-----------+----------+--------------+ PFV      Full                                                        +---------+---------------+---------+-----------+----------+--------------+ POP      Full           Yes      Yes                                 +---------+---------------+---------+-----------+----------+--------------+ PTV      Full                                                        +---------+---------------+---------+-----------+----------+--------------+ PERO     Full                                                         +---------+---------------+---------+-----------+----------+--------------+   +---------+---------------+---------+-----------+----------+--------------+ LEFT     CompressibilityPhasicitySpontaneityPropertiesThrombus Aging +---------+---------------+---------+-----------+----------+--------------+ CFV      Full           Yes      Yes                                 +---------+---------------+---------+-----------+----------+--------------+  SFJ      Full                                                        +---------+---------------+---------+-----------+----------+--------------+ FV Prox  Full                                                        +---------+---------------+---------+-----------+----------+--------------+ FV Mid   Full                                                        +---------+---------------+---------+-----------+----------+--------------+ FV DistalFull                                                        +---------+---------------+---------+-----------+----------+--------------+ PFV      Full                                                        +---------+---------------+---------+-----------+----------+--------------+ POP      None           No       Yes                  Acute          +---------+---------------+---------+-----------+----------+--------------+ PTV      Partial                                      Acute          +---------+---------------+---------+-----------+----------+--------------+ PERO     Partial                                      Acute          +---------+---------------+---------+-----------+----------+--------------+     Summary: RIGHT: - There is no evidence of deep vein thrombosis in the lower extremity.  LEFT: - Findings consistent with acute deep vein thrombosis involving the left popliteal vein, left posterior tibial veins, and left peroneal veins.  *See table(s) above for  measurements and observations. Electronically signed by Ruta Hinds MD on 06/27/2020 at 5:21:22 PM.    Final     Scheduled Meds: . Chlorhexidine Gluconate Cloth  6 each Topical Daily  . insulin aspart  0-5 Units Subcutaneous QHS  . insulin aspart  0-9 Units Subcutaneous TID WC  . insulin glargine  10 Units Subcutaneous QHS   Continuous Infusions: . heparin 1,250 Units/hr (06/27/20 1152)     LOS: 2 days   Time spent: 22min  Gwyndolyn Saxon  Loraine Grip, DO [facility]  If 7PM-7AM, please contact night-coverage [email or web address]  06/28/2020, 7:01 AM

## 2020-06-28 NOTE — Progress Notes (Signed)
Patient complains of pain in his right foot and ankle. He would like further medical evaluation to determine if he has a injury to the ankle. Paged Dr. Marlowe Sax and she ordered an xray of the right foot and ankle. Advised patient of the MD order for xray.

## 2020-06-28 NOTE — Progress Notes (Signed)
Pt reporting significant pain and swelling in his right ankle and is unable to stand on his right foot with PT.  MD made aware.  Will continue to monitor.

## 2020-06-28 NOTE — Progress Notes (Signed)
ANTICOAGULATION CONSULT NOTE - Initial Consult  Pharmacy Consult for heparin Indication: pulmonary embolus   Assessment: 7 YOM who presents with massive PE and saddle embolus and associated R heart strain. Patient to receive systemic thrombolysis with IV tPA. Pharmacy consulted to start IV heparin post tPA (tPA finished ~0100 9/20) -heparin level at goal, CBC stable   Goal of Therapy:  Heparin level= 0.3 - 0.7 units/mL  Monitor platelets by anticoagulation protocol: Yes  Plan Continue heparin at 1250 units/hr Daily heparin level/CBC Will follow oral anticoagulation plans  Hildred Laser, PharmD Clinical Pharmacist **Pharmacist phone directory can now be found on [email or web address] ([email or web address]).  Listed under [place].

## 2020-06-29 ENCOUNTER — Inpatient Hospital Stay (HOSPITAL_COMMUNITY): Payer: Medicare Other

## 2020-06-29 LAB — CBC
HCT: 32.5 % — ABNORMAL LOW (ref 39.0–52.0)
Hemoglobin: 10.7 g/dL — ABNORMAL LOW (ref 13.0–17.0)
MCH: 31.5 pg (ref 26.0–34.0)
MCHC: 32.9 g/dL (ref 30.0–36.0)
MCV: 95.6 fL (ref 80.0–100.0)
Platelets: 304 10*3/uL (ref 150–400)
RBC: 3.4 MIL/uL — ABNORMAL LOW (ref 4.22–5.81)
RDW: 14.8 % (ref 11.5–15.5)
WBC: 7.4 10*3/uL (ref 4.0–10.5)
nRBC: 0 % (ref 0.0–0.2)

## 2020-06-29 LAB — GLUCOSE, CAPILLARY
Glucose-Capillary: 168 mg/dL — ABNORMAL HIGH (ref 70–99)
Glucose-Capillary: 142 mg/dL — ABNORMAL HIGH (ref 70–99)
Glucose-Capillary: 173 mg/dL — ABNORMAL HIGH (ref 70–99)

## 2020-06-29 LAB — COMPREHENSIVE METABOLIC PANEL
ALT: 80 U/L — ABNORMAL HIGH (ref 0–44)
AST: 59 U/L — ABNORMAL HIGH (ref 15–41)
Albumin: 3.6 g/dL (ref 3.5–5.0)
Alkaline Phosphatase: 91 U/L (ref 38–126)
Anion gap: 14 (ref 5–15)
BUN: 11 mg/dL (ref 8–23)
CO2: 22 mmol/L (ref 22–32)
Calcium: 9.6 mg/dL (ref 8.9–10.3)
Chloride: 101 mmol/L (ref 98–111)
Creatinine, Ser: 1.2 mg/dL (ref 0.61–1.24)
GFR calc Af Amer: >60 mL/min (ref >60)
GFR calc non Af Amer: >60 mL/min (ref >60)
Glucose, Bld: 154 mg/dL — ABNORMAL HIGH (ref 70–99)
Potassium: 3.4 mmol/L — ABNORMAL LOW (ref 3.5–5.1)
Sodium: 137 mmol/L (ref 135–145)
Total Bilirubin: 0.8 mg/dL (ref 0.3–1.2)
Total Protein: 7.2 g/dL (ref 6.5–8.1)

## 2020-06-29 LAB — HEPARIN LEVEL (UNFRACTIONATED)
Heparin Unfractionated: 0.24 IU/mL — ABNORMAL LOW (ref 0.30–0.70)
Heparin Unfractionated: 0.13 IU/mL — ABNORMAL LOW (ref 0.30–0.70)

## 2020-06-29 NOTE — NC FL2 (Addendum)
[place] LEVEL OF CARE SCREENING TOOL     IDENTIFICATION  Patient Name: Keith [name] Birthdate: 07-11-1951 Sex: male Admission Date (Current Location): 06/26/2020  [facility] and [place] Number:  Herbalist and Address:  [facility], [place] [address], [place], [place] [address]      Provider Number: [id number]  Attending Physician Name and Address:  Little Ishikawa, MD  Relative Name and Phone Number:       Current Level of Care: Hospital Recommended Level of Care: [place] Prior Approval Number:    Date Approved/Denied:   PASRR Number: [id number]  Discharge Plan: SNF    Current Diagnoses: Patient Active Problem List   Diagnosis Date Noted  . Acute respiratory failure with hypoxia ([place])   . Pulmonary embolism ([place]) 06/26/2020  . Lumbar spinal stenosis 06/01/2020  . Spinal cord compression ([place]) 04/02/2020  . Thoracic myelopathy 07/25/2018  . Hyperlipidemia associated with type 2 diabetes mellitus ([place]) 11/14/2016  . Type 2 diabetes mellitus with diabetic neuropathy, unspecified ([place]) 11/14/2016  . Routine general medical examination at a health care facility 11/14/2015  . Hiatal hernia 08/30/2014  . AKI (acute kidney injury) ([place]) 04/10/2013  . Allergic rhinitis 04/10/2013  . Spinal stenosis of lumbar region 11/06/2012  . Allergic rhinitis due to pollen 01/15/2011  . OSTEOARTHRITIS 11/02/2008  . HIP PAIN, LEFT 02/09/2008  . Morbid obesity ([place]) 11/07/2007  . ERECTILE DYSFUNCTION, MILD 11/07/2007  . Essential hypertension 11/07/2007    Orientation RESPIRATION BLADDER Height & Weight     Self, Time, Situation, Place  Normal Continent Weight: 265 lb 6.9 oz (120.4 kg) Height:  6' (182.9 cm)  BEHAVIORAL SYMPTOMS/MOOD NEUROLOGICAL BOWEL NUTRITION STATUS      Continent Diet (See DC summary)  AMBULATORY STATUS COMMUNICATION OF NEEDS Skin   Limited Assist Verbally Normal                        Personal Care Assistance Level of Assistance  Bathing, Dressing, Feeding Bathing Assistance: Limited assistance Feeding assistance: Limited assistance Dressing Assistance: Limited assistance     Functional Limitations Info  Sight Sight Info: Impaired        SPECIAL CARE FACTORS FREQUENCY  OT (By licensed OT), PT (By licensed PT)     PT Frequency: 5X per week OT Frequency: 5X per week            Contractures Contractures Info: Not present    Additional Factors Info  Code Status, Allergies, Isolation Precautions, Insulin Sliding Scale Code Status Info: FULL Allergies Info: Lisinopril   Insulin Sliding Scale Info: See DC summary Isolation Precautions Info: MRSA     Current Medications (06/29/2020):  This is the current hospital active medication list Current Facility-Administered Medications  Medication Dose Route Frequency Provider Last Rate Last Admin  . Chlorhexidine Gluconate Cloth 2 % PADS 6 each  6 each Topical Daily Icard, Bradley L, DO   6 each at 06/27/20 1555  . docusate sodium (COLACE) capsule 100 mg  100 mg Oral BID PRN Gleason, Otilio Carpen, PA-C      . heparin ADULT infusion 100 units/mL (25000 units/243mL sodium chloride 0.45%)  1,500 Units/hr Intravenous Continuous Laren Everts, RPH 15 mL/hr at 06/29/20 1329 1,500 Units/hr at 06/29/20 1329  . insulin aspart (novoLOG) injection 0-5 Units  0-5 Units Subcutaneous QHS Desai, Rahul P, PA-C      . insulin aspart (novoLOG) injection 0-9 Units  0-9 Units Subcutaneous TID WC Desai, Rahul P, PA-C   2 Units at 06/29/20 3779  . insulin glargine (LANTUS) injection 10 Units  10 Units Subcutaneous QHS Rigoberto Noel, MD   10 Units at 06/28/20 2153  . oxyCODONE (Oxy IR/ROXICODONE) immediate release tablet 5 mg  5 mg Oral Q6H PRN Gleason, Otilio Carpen, PA-C   5 mg at 06/29/20 0525  . polyethylene glycol (MIRALAX / GLYCOLAX) packet 17 g  17 g Oral Daily PRN Gleason, Otilio Carpen, PA-C         Discharge Medications: Please see  discharge summary for a list of discharge medications.  Relevant Imaging Results:  Relevant Lab Results:   Additional Information SSN: [id number]  Glennon Hamilton, Student-Social Work

## 2020-06-29 NOTE — Progress Notes (Signed)
Subjective:  Patient states pain is improved this morning, though still moderate pain at right ankle. Denies calf pain, nausea, vomiting or parasthesias.    Objective:  PE: VITALS:   Vitals:   06/29/20 0515 06/29/20 0616 06/29/20 0732 06/29/20 0830  BP: 131/78  127/80 121/75  Pulse: 95  90 90  Resp: 20  19 18   Temp: 100.2 F (37.9 C)  98.5 F (36.9 C) 98.8 F (37.1 C)  TempSrc: Oral  Oral Oral  SpO2: 98%  97% 96%  Weight:  120.4 kg    Height:       General: Sitting up in bed, in no acute distress MSK: RLE - splint in place. Able to flex and extend all toes of right foot. Sensation intact through all toes. DP pulse found on doppler. No calf TTP, calf compressible.   LABS  Results for orders placed or performed during the hospital encounter of 06/26/20 (from the past 24 hour(s))  Glucose, capillary     Status: Abnormal   Collection Time: 06/28/20 11:37 AM  Result Value Ref Range   Glucose-Capillary 140 (H) 70 - 99 mg/dL  Glucose, capillary     Status: Abnormal   Collection Time: 06/28/20  4:37 PM  Result Value Ref Range   Glucose-Capillary 122 (H) 70 - 99 mg/dL  Glucose, capillary     Status: Abnormal   Collection Time: 06/28/20  8:55 PM  Result Value Ref Range   Glucose-Capillary 132 (H) 70 - 99 mg/dL   Comment 1 Notify RN    Comment 2 Document in Chart   Heparin level (unfractionated)     Status: Abnormal   Collection Time: 06/29/20  4:15 AM  Result Value Ref Range   Heparin Unfractionated 0.24 (L) 0.30 - 0.70 IU/mL  CBC     Status: Abnormal   Collection Time: 06/29/20  4:15 AM  Result Value Ref Range   WBC 7.4 4.0 - 10.5 K/uL   RBC 3.40 (L) 4.22 - 5.81 MIL/uL   Hemoglobin 10.7 (L) 13.0 - 17.0 g/dL   HCT 32.5 (L) 39 - 52 %   MCV 95.6 80.0 - 100.0 fL   MCH 31.5 26.0 - 34.0 pg   MCHC 32.9 30.0 - 36.0 g/dL   RDW 14.8 11.5 - 15.5 %   Platelets 304 150 - 400 K/uL   nRBC 0.0 0.0 - 0.2 %  Comprehensive metabolic panel     Status: Abnormal   Collection  Time: 06/29/20  4:15 AM  Result Value Ref Range   Sodium 137 135 - 145 mmol/L   Potassium 3.4 (L) 3.5 - 5.1 mmol/L   Chloride 101 98 - 111 mmol/L   CO2 22 22 - 32 mmol/L   Glucose, Bld 154 (H) 70 - 99 mg/dL   BUN 11 8 - 23 mg/dL   Creatinine, Ser 1.20 0.61 - 1.24 mg/dL   Calcium 9.6 8.9 - 10.3 mg/dL   Total Protein 7.2 6.5 - 8.1 g/dL   Albumin 3.6 3.5 - 5.0 g/dL   AST 59 (H) 15 - 41 U/L   ALT 80 (H) 0 - 44 U/L   Alkaline Phosphatase 91 38 - 126 U/L   Total Bilirubin 0.8 0.3 - 1.2 mg/dL   GFR calc non Af Amer >60 >60 mL/min   GFR calc Af Amer >60 >60 mL/min   Anion gap 14 5 - 15  Glucose, capillary     Status: Abnormal   Collection Time: 06/29/20  6:22 AM  Result Value Ref Range   Glucose-Capillary 168 (H) 70 - 99 mg/dL   Comment 1 Notify RN    Comment 2 Document in Chart     DG Ankle Complete Right  Result Date: 06/28/2020 CLINICAL DATA:  Right foot and ankle pain EXAM: RIGHT ANKLE - COMPLETE 3+ VIEW; RIGHT FOOT - 2 VIEW COMPARISON:  None. FINDINGS: Right ankle: Frontal, oblique, and lateral views demonstrate an oblique fracture of the distal right fibula, with mild displacement. Small ossific densities are seen within the medial aspect of the ankle mortise, consistent with avulsion fractures. Exact donor site is not identified, though likely from the medial malleolus. Coronally oriented minimally displaced fracture of the posterior malleolus is seen on the lateral view. There is dorsal subluxation of the talus relative to the tibial plafond. There is diffuse soft tissue edema. Right foot: Frontal and lateral views of the right foot demonstrate a small avulsion fracture off the dorsal distal margin of the talus. There is dorsal subluxation of the talus relative to the tibial plafond. No other acute bony abnormalities. IMPRESSION: 1. Trimalleolar fracture as above. 2. Dorsal subluxation of the talus relative to the tibial plafond. 3. Small avulsion fracture dorsal distal margin of the  talus. 4. Diffuse soft tissue edema. Electronically Signed   By: Randa Ngo M.D.   On: 06/28/2020 20:44   DG Ankle Right Port  Result Date: 06/29/2020 CLINICAL DATA:  Right ankle fracture reduction, EXAM: PORTABLE RIGHT ANKLE - 2 VIEW COMPARISON:  06/28/2020 FINDINGS: Two view radiograph of the right ankle spur formed within an external immobilizer which obscures fine bony detail. There has been interval reduction of tibiotalar subluxation. Medial joint space widening persists. Fracture fragments are in grossly anatomic alignment on this limited examination with persistent 2-3 mm posterior displacement and distraction of the distal fibular fracture fragment. IMPRESSION: Interval reduction of right tibiotalar subluxation Electronically Signed   By: Fidela Salisbury MD   On: 06/29/2020 00:46   DG Foot 2 Views Right  Result Date: 06/28/2020 CLINICAL DATA:  Right foot and ankle pain EXAM: RIGHT ANKLE - COMPLETE 3+ VIEW; RIGHT FOOT - 2 VIEW COMPARISON:  None. FINDINGS: Right ankle: Frontal, oblique, and lateral views demonstrate an oblique fracture of the distal right fibula, with mild displacement. Small ossific densities are seen within the medial aspect of the ankle mortise, consistent with avulsion fractures. Exact donor site is not identified, though likely from the medial malleolus. Coronally oriented minimally displaced fracture of the posterior malleolus is seen on the lateral view. There is dorsal subluxation of the talus relative to the tibial plafond. There is diffuse soft tissue edema. Right foot: Frontal and lateral views of the right foot demonstrate a small avulsion fracture off the dorsal distal margin of the talus. There is dorsal subluxation of the talus relative to the tibial plafond. No other acute bony abnormalities. IMPRESSION: 1. Trimalleolar fracture as above. 2. Dorsal subluxation of the talus relative to the tibial plafond. 3. Small avulsion fracture dorsal distal margin of the talus.  4. Diffuse soft tissue edema. Electronically Signed   By: Randa Ngo M.D.   On: 06/28/2020 20:44   ECHOCARDIOGRAM COMPLETE  Result Date: 06/27/2020    ECHOCARDIOGRAM REPORT   Patient Name:   Keith [name] Date of Exam: 06/27/2020 Medical Rec #:  [id number]        Height:       72.0 in Accession #:    [id number]       Weight:  267.0 lb Date of Birth:  01/08/1951       BSA:          2.409 m Patient Age:    38 years         BP:           118/88 mmHg Patient Gender: M                HR:           81 bpm. Exam Location:  Inpatient Procedure: 2D Echo Indications:    pulmonary embolus 415.19  History:        Patient has no prior history of Echocardiogram examinations.                 Risk Factors:Diabetes and Hypertension.  Sonographer:    Johny Chess Referring Phys: [id number] LAURA R GLEASON  Sonographer Comments: Image acquisition challenging due to patient body habitus. IMPRESSIONS  1. Left ventricular ejection fraction, by estimation, is 60 to 65%. The left ventricle has normal function. The left ventricle has no regional wall motion abnormalities. There is mild concentric left ventricular hypertrophy. Left ventricular diastolic parameters are consistent with Grade I diastolic dysfunction (impaired relaxation). There is the interventricular septum is flattened in systole and diastole, consistent with right ventricular pressure and volume overload.  2. Right ventricular systolic function is moderately reduced. The right ventricular size is moderately enlarged. There is moderately elevated pulmonary artery systolic pressure. The estimated right ventricular systolic pressure is 97.6 mmHg.  3. The mitral valve is normal in structure. No evidence of mitral valve regurgitation. No evidence of mitral stenosis.  4. The aortic valve is tricuspid. Aortic valve regurgitation is not visualized. Mild aortic valve sclerosis is present, with no evidence of aortic valve stenosis.  5. The inferior vena cava is  dilated in size with <50% respiratory variability, suggesting right atrial pressure of 15 mmHg. FINDINGS  Left Ventricle: Left ventricular ejection fraction, by estimation, is 60 to 65%. The left ventricle has normal function. The left ventricle has no regional wall motion abnormalities. The left ventricular internal cavity size was normal in size. There is  mild concentric left ventricular hypertrophy. The interventricular septum is flattened in systole and diastole, consistent with right ventricular pressure and volume overload. Left ventricular diastolic parameters are consistent with Grade I diastolic dysfunction (impaired relaxation). Normal left ventricular filling pressure. Right Ventricle: The right ventricular size is moderately enlarged. No increase in right ventricular wall thickness. Right ventricular systolic function is moderately reduced. There is moderately elevated pulmonary artery systolic pressure. The tricuspid  regurgitant velocity is 2.55 m/s, and with an assumed right atrial pressure of 15 mmHg, the estimated right ventricular systolic pressure is 73.4 mmHg. Left Atrium: Left atrial size was normal in size. Right Atrium: Right atrial size was normal in size. Pericardium: There is no evidence of pericardial effusion. Mitral Valve: The mitral valve is normal in structure. No evidence of mitral valve regurgitation. No evidence of mitral valve stenosis. Tricuspid Valve: The tricuspid valve is normal in structure. Tricuspid valve regurgitation is mild . No evidence of tricuspid stenosis. Aortic Valve: The aortic valve is tricuspid. Aortic valve regurgitation is not visualized. Mild aortic valve sclerosis is present, with no evidence of aortic valve stenosis. Pulmonic Valve: The pulmonic valve was normal in structure. Pulmonic valve regurgitation is trivial. No evidence of pulmonic stenosis. Aorta: The aortic root is normal in size and structure. Venous: The inferior vena cava is dilated in size  with  less than 50% respiratory variability, suggesting right atrial pressure of 15 mmHg. IAS/Shunts: No atrial level shunt detected by color flow Doppler.  LEFT VENTRICLE PLAX 2D LVIDd:         4.00 cm  Diastology LVIDs:         3.10 cm  LV e' medial:    5.98 cm/s LV PW:         1.30 cm  LV E/e' medial:  9.5 LV IVS:        1.40 cm  LV e' lateral:   6.20 cm/s LVOT diam:     2.00 cm  LV E/e' lateral: 9.1 LV SV:         47 LV SV Index:   20 LVOT Area:     3.14 cm  RIGHT VENTRICLE             IVC RV Basal diam:  4.71 cm     IVC diam: 2.30 cm RV S prime:     11.70 cm/s LEFT ATRIUM             Index       RIGHT ATRIUM           Index LA diam:        3.50 cm 1.45 cm/m  RA Area:     14.20 cm LA Vol (A2C):   45.8 ml 19.01 ml/m RA Volume:   33.10 ml  13.74 ml/m LA Vol (A4C):   40.1 ml 16.65 ml/m LA Biplane Vol: 43.2 ml 17.93 ml/m  AORTIC VALVE LVOT Vmax:   93.30 cm/s LVOT Vmean:  59.200 cm/s LVOT VTI:    0.150 m  AORTA Ao Root diam: 3.50 cm Ao Asc diam:  3.50 cm MV E velocity: 56.60 cm/s  TRICUSPID VALVE MV A velocity: 74.10 cm/s  TR Peak grad:   26.0 mmHg MV E/A ratio:  0.76        TR Vmax:        255.00 cm/s                             SHUNTS                            Systemic VTI:  0.15 m                            Systemic Diam: 2.00 cm Fransico Him MD Electronically signed by Fransico Him MD Signature Date/Time: 06/27/2020/11:01:08 AM    Final    VAS [place] LOWER EXTREMITY VENOUS (DVT)  Result Date: 06/27/2020  Lower Venous DVTStudy Indications: Pulmonary embolism.  Comparison Study: No prior study on file Performing Technologist: Sharion Dove RVS  Examination Guidelines: A complete evaluation includes B-mode imaging, spectral Doppler, color Doppler, and power Doppler as needed of all accessible portions of each vessel. Bilateral testing is considered an integral part of a complete examination. Limited examinations for reoccurring indications may be performed as noted. The reflux portion of the exam is performed  with the patient in reverse Trendelenburg.  +---------+---------------+---------+-----------+----------+--------------+ RIGHT    CompressibilityPhasicitySpontaneityPropertiesThrombus Aging +---------+---------------+---------+-----------+----------+--------------+ CFV      Full           Yes      Yes                                 +---------+---------------+---------+-----------+----------+--------------+  SFJ      Full                                                        +---------+---------------+---------+-----------+----------+--------------+ FV Prox  Full                                                        +---------+---------------+---------+-----------+----------+--------------+ FV Mid   Full                                                        +---------+---------------+---------+-----------+----------+--------------+ FV DistalFull                                                        +---------+---------------+---------+-----------+----------+--------------+ PFV      Full                                                        +---------+---------------+---------+-----------+----------+--------------+ POP      Full           Yes      Yes                                 +---------+---------------+---------+-----------+----------+--------------+ PTV      Full                                                        +---------+---------------+---------+-----------+----------+--------------+ PERO     Full                                                        +---------+---------------+---------+-----------+----------+--------------+   +---------+---------------+---------+-----------+----------+--------------+ LEFT     CompressibilityPhasicitySpontaneityPropertiesThrombus Aging +---------+---------------+---------+-----------+----------+--------------+ CFV      Full           Yes      Yes                                  +---------+---------------+---------+-----------+----------+--------------+ SFJ      Full                                                        +---------+---------------+---------+-----------+----------+--------------+  FV Prox  Full                                                        +---------+---------------+---------+-----------+----------+--------------+ FV Mid   Full                                                        +---------+---------------+---------+-----------+----------+--------------+ FV DistalFull                                                        +---------+---------------+---------+-----------+----------+--------------+ PFV      Full                                                        +---------+---------------+---------+-----------+----------+--------------+ POP      None           No       Yes                  Acute          +---------+---------------+---------+-----------+----------+--------------+ PTV      Partial                                      Acute          +---------+---------------+---------+-----------+----------+--------------+ PERO     Partial                                      Acute          +---------+---------------+---------+-----------+----------+--------------+     Summary: RIGHT: - There is no evidence of deep vein thrombosis in the lower extremity.  LEFT: - Findings consistent with acute deep vein thrombosis involving the left popliteal vein, left posterior tibial veins, and left peroneal veins.  *See table(s) above for measurements and observations. Electronically signed by Ruta Hinds MD on 06/27/2020 at 5:21:22 PM.    Final     Assessment/Plan: Right trimalleolar ankle fracture with dorsal dislocation: - s/p reduction yesterday evening, post reduction x-rays are hard to read, CT ordered - patient will need an ORIF, currently posted for Friday afternoon as there is no OR time available  tomorrow - NWB RLE, can get up with assistance if able to  - continue oxycodone for pain   Contact information:   Weekdays 8-5 Merlene Pulling, PA-C [phone] A fter hours and holidays please check [email or web address] for group call information for [facility]  Ventura Bruns 06/29/2020, 10:16 AM

## 2020-06-29 NOTE — Progress Notes (Signed)
PROGRESS NOTE    Keith [name]  HAL:[id number] DOB: March 15, 1951 DOA: 06/26/2020 PCP: [name], [name], MD   Brief Narrative:  Pt presented with near syncope after returning from the bathroom.  Also complained of chest pain like "elephant sitting on his chest" noted to be hypoxic with saturation in the 80s, blood pressure in the 50s, brought in by EMS as a code STEMI .  Placed on nonrebreather, initial blood pressure 70s, started on Levophed, central line placed in ED, CT angiogram obtained which confirmed saddle PE. Recent admission 3 weeks ago for acute on chronic back pain he is on chronic opiates for back pain for at least 2 years by neurosurgery.  He reports since his admission he has mostly been sitting around, limited movement.   Patient admitted as above with atypical chest pain hypoxia found to have massive saddle PE as well as left lower extremity DVT likely provoked in the setting of poor ambulatory status and prolonged sedentary lifestyle.  Patient tolerated TPA quite well, now on heparin drip likely transition over to p.o. anticoagulation the next 24 to 48 hours pending clinical course.   Assessment & Plan:   Active Problems:   AKI (acute kidney injury) ([place])   Pulmonary embolism (HCC)   Acute respiratory failure with hypoxia (HCC)    Acute massive/saddle PE with hemodynamic compromise and near syncope, POA - Status post TPA followed by IV heparin  - 2D echo - without overt findings as below - Left lower extremity DVT noted on ultrasound - BP improving after lytics - continue holding home meds  Acute hypoxic respiratory failure -related to above, titrate oxygen as needed - Ambulatory oxygen screen daily, wean as tolerated SpO2: 98 % O2 Flow Rate (L/min): 1 L/min  Ambulatory dysfunction, acute mechanical fall  Right trimalleolar ankle fracture with dorsal dislocation - Orthopedics following, reduced 06/28/2020 - Likely ORIF in the next 24 to 48 hours pending surgical  schedule - Patient has known chronic ambulatory dysfunction, will continue PT OT with close monitoring given high fall risk  AKI, resolving - Related to above, continue to advance diet, increase p.o. intake as tolerated Lab Results  Component Value Date   CREATININE 1.20 06/29/2020   CREATININE 1.32 (H) 06/28/2020   CREATININE 1.95 (H) 06/27/2020   Chronic opiate use - Resume oxycodone - Holding OxyContin for now - May require short acting narcotics postoperatively  Uncontrolled diabetes -likely related to steroids that he recently received - Start Lantus 10 units - SSI resistant scale - A1C 6.7  DVT prophylaxis: Heparin drip as above Code Status: Full Family Communication: Wife at bedside  Status is: Inpatient  Dispo: The patient is from: Home              Anticipated d/c is to: To be determined pending clinical course and ambulatory status              Anticipated d/c date is: 24 to 48 hours pending clinical course              Patient currently not medically stable for discharge given ongoing need for IV anticoagulation in the setting of saddle PE, disposition pending advancement of anticoagulation to p.o. as well as safe discharge location.  Consultants:   PCCM  Procedures:   TPA administration as above  Antimicrobials:  None  Subjective: Mechanical fall yesterday with subsequent right ankle fracture as above, pain currently well controlled, patient somewhat frustrated with his current position but otherwise indicates his pain  is well controlled, denies nausea, vomiting, diarrhea, constipation, headache, fevers, chills.  Objective: Vitals:   06/28/20 2000 06/29/20 0003 06/29/20 0515 06/29/20 0616  BP: 134/77 131/78 131/78   Pulse: 83 83 95   Resp: 16 11 20    Temp: 98.7 F (37.1 C) 98.9 F (37.2 C) 100.2 F (37.9 C)   TempSrc: Oral Oral Oral   SpO2: 100% 99% 98%   Weight:    120.4 kg  Height:        Intake/Output Summary (Last 24 hours) at 06/29/2020  0728 Last data filed at 06/29/2020 0517 Gross per 24 hour  Intake 240 ml  Output 925 ml  Net -685 ml   Filed Weights   06/27/20 0500 06/28/20 0503 06/29/20 0616  Weight: 121.1 kg 120.1 kg 120.4 kg    Examination:  General:  Pleasantly resting in bed, No acute distress. HEENT:  Normocephalic atraumatic.  Sclerae nonicteric, noninjected.  Extraocular movements intact bilaterally. Neck:  Without mass or deformity.  Trachea is midline. Lungs:  Clear to auscultate bilaterally without rhonchi, wheeze, or rales. Heart:  Regular rate and rhythm.  Without murmurs, rubs, or gallops. Abdomen:  Soft, nontender, nondistended.  Without guarding or rebound. Extremities: Right ankle bandage clean dry intact, sensation intact distally Vascular:  Dorsalis pedis and posterior tibial pulses palpable bilaterally. Skin:  Warm and dry, no erythema, no ulcerations.   Data Reviewed: I have personally reviewed following labs and imaging studies  CBC: Recent Labs  Lab 06/26/20 2001 06/26/20 2006 06/26/20 2153 06/27/20 0101 06/27/20 0419 06/28/20 0207 06/29/20 0415  WBC 8.6  --  13.9* 12.0*  --  7.7 7.4  NEUTROABS 4.5  --   --   --   --   --   --   HGB 12.2*   < > 10.6* 10.7* 10.5* 9.8* 10.7*  HCT 40.7   < > 34.6* 33.7* 31.0* 30.5* 32.5*  MCV 101.2*  --  99.7 96.6  --  94.7 95.6  PLT 353  --  355 289  --  259 304   < > = values in this interval not displayed.   Basic Metabolic Panel: Recent Labs  Lab 06/26/20 2001 06/26/20 2001 06/26/20 2006 06/27/20 0101 06/27/20 0419 06/28/20 0207 06/29/20 0415  NA 141   < > 142 138 141 139 137  K 3.7   < > 3.6 4.3 4.1 3.4* 3.4*  CL 105  --  108 105  --  105 101  CO2 18*  --   --  22  --  23 22  GLUCOSE 328*  --  318* 204*  --  156* 154*  BUN 24*  --  27* 27*  --  19 11  CREATININE 2.00*  --  1.70* 1.95*  --  1.32* 1.20  CALCIUM 9.9  --   --  9.0  --  9.1 9.6  MG  --   --   --  2.3  --  2.0  --   PHOS  --   --   --  4.6  --  2.3*  --    < > =  values in this interval not displayed.   GFR: Estimated Creatinine Clearance: 80 mL/min (by C-G formula based on SCr of 1.2 mg/dL). Liver Function Tests: Recent Labs  Lab 06/26/20 2001 06/29/20 0415  AST 98* 59*  ALT 56* 80*  ALKPHOS 88 91  BILITOT 0.8 0.8  PROT 7.7 7.2  ALBUMIN 3.5 3.6   No results for input(s):  LIPASE, AMYLASE in the last 168 hours. No results for input(s): AMMONIA in the last 168 hours. Coagulation Profile: Recent Labs  Lab 06/26/20 2001  INR 1.1   Cardiac Enzymes: No results for input(s): CKTOTAL, CKMB, CKMBINDEX, TROPONINI in the last 168 hours. BNP (last 3 results) No results for input(s): PROBNP in the last 8760 hours. HbA1C: Recent Labs    06/27/20 2000  HGBA1C 6.7*   CBG: Recent Labs  Lab 06/28/20 0610 06/28/20 1137 06/28/20 1637 06/28/20 2055 06/29/20 0622  GLUCAP 165* 140* 122* 132* 168*   Lipid Profile: No results for input(s): CHOL, HDL, LDLCALC, TRIG, CHOLHDL, LDLDIRECT in the last 72 hours. Thyroid Function Tests: No results for input(s): TSH, T4TOTAL, FREET4, T3FREE, THYROIDAB in the last 72 hours. Anemia Panel: No results for input(s): VITAMINB12, FOLATE, FERRITIN, TIBC, IRON, RETICCTPCT in the last 72 hours. Sepsis Labs: Recent Labs  Lab 06/26/20 2124  LATICACIDVEN 2.1*    Recent Results (from the past 240 hour(s))  SARS Coronavirus 2 by RT PCR (hospital order, performed in [facility] hospital lab) Nasopharyngeal Nasopharyngeal Swab     Status: None   Collection Time: 06/26/20  9:10 PM   Specimen: Nasopharyngeal Swab  Result Value Ref Range Status   SARS Coronavirus 2 NEGATIVE NEGATIVE Final    Comment: (NOTE) SARS-CoV-2 target nucleic acids are NOT DETECTED.  The SARS-CoV-2 RNA is generally detectable in upper and lower respiratory specimens during the acute phase of infection. The lowest concentration of SARS-CoV-2 viral copies this assay can detect is 250 copies / mL. A negative result does not preclude  SARS-CoV-2 infection and should not be used as the sole basis for treatment or other patient management decisions.  A negative result may occur with improper specimen collection / handling, submission of specimen other than nasopharyngeal swab, presence of viral mutation(s) within the areas targeted by this assay, and inadequate number of viral copies (<250 copies / mL). A negative result must be combined with clinical observations, patient history, and epidemiological information.  Fact Sheet for Patients:   StrictlyIdeas.no  Fact Sheet for Healthcare Providers: BankingDealers.co.za  This test is not yet approved or  cleared by the [place] FDA and has been authorized for detection and/or diagnosis of SARS-CoV-2 by FDA under an Emergency Use Authorization (EUA).  This EUA will remain in effect (meaning this test can be used) for the duration of the COVID-19 declaration under Section 564(b)(1) of the Act, 21 U.S.C. section 360bbb-3(b)(1), unless the authorization is terminated or revoked sooner.  Performed at [facility], [place] [address] [place], [place] [address]   Culture, blood (routine x 2)     Status: None (Preliminary result)   Collection Time: 06/26/20  9:33 PM   Specimen: BLOOD  Result Value Ref Range Status   Specimen Description BLOOD CENTRAL LINE  Final   Special Requests   Final    BOTTLES DRAWN AEROBIC AND ANAEROBIC Blood Culture adequate volume   Culture   Final    NO GROWTH 2 DAYS Performed at [facility] [address], [place], [place] [address]    Report Status PENDING  Incomplete  MRSA PCR Screening     Status: None   Collection Time: 06/26/20 11:30 PM   Specimen: Nasopharyngeal  Result Value Ref Range Status   MRSA by PCR NEGATIVE NEGATIVE Final    Comment:        The GeneXpert MRSA Assay (FDA approved for NASAL specimens only), is one component of  a comprehensive MRSA  colonization surveillance program. It is not intended to diagnose MRSA infection nor to guide or monitor treatment for MRSA infections. Performed at [facility], [place] [address], [place], [place] [address]          Radiology Studies: DG Ankle Complete Right  Result Date: 06/28/2020 CLINICAL DATA:  Right foot and ankle pain EXAM: RIGHT ANKLE - COMPLETE 3+ VIEW; RIGHT FOOT - 2 VIEW COMPARISON:  None. FINDINGS: Right ankle: Frontal, oblique, and lateral views demonstrate an oblique fracture of the distal right fibula, with mild displacement. Small ossific densities are seen within the medial aspect of the ankle mortise, consistent with avulsion fractures. Exact donor site is not identified, though likely from the medial malleolus. Coronally oriented minimally displaced fracture of the posterior malleolus is seen on the lateral view. There is dorsal subluxation of the talus relative to the tibial plafond. There is diffuse soft tissue edema. Right foot: Frontal and lateral views of the right foot demonstrate a small avulsion fracture off the dorsal distal margin of the talus. There is dorsal subluxation of the talus relative to the tibial plafond. No other acute bony abnormalities. IMPRESSION: 1. Trimalleolar fracture as above. 2. Dorsal subluxation of the talus relative to the tibial plafond. 3. Small avulsion fracture dorsal distal margin of the talus. 4. Diffuse soft tissue edema. Electronically Signed   By: Randa Ngo M.D.   On: 06/28/2020 20:44   DG Ankle Right Port  Result Date: 06/29/2020 CLINICAL DATA:  Right ankle fracture reduction, EXAM: PORTABLE RIGHT ANKLE - 2 VIEW COMPARISON:  06/28/2020 FINDINGS: Two view radiograph of the right ankle spur formed within an external immobilizer which obscures fine bony detail. There has been interval reduction of tibiotalar subluxation. Medial joint space widening persists. Fracture fragments are in grossly anatomic alignment on this limited  examination with persistent 2-3 mm posterior displacement and distraction of the distal fibular fracture fragment. IMPRESSION: Interval reduction of right tibiotalar subluxation Electronically Signed   By: Fidela Salisbury MD   On: 06/29/2020 00:46   DG Foot 2 Views Right  Result Date: 06/28/2020 CLINICAL DATA:  Right foot and ankle pain EXAM: RIGHT ANKLE - COMPLETE 3+ VIEW; RIGHT FOOT - 2 VIEW COMPARISON:  None. FINDINGS: Right ankle: Frontal, oblique, and lateral views demonstrate an oblique fracture of the distal right fibula, with mild displacement. Small ossific densities are seen within the medial aspect of the ankle mortise, consistent with avulsion fractures. Exact donor site is not identified, though likely from the medial malleolus. Coronally oriented minimally displaced fracture of the posterior malleolus is seen on the lateral view. There is dorsal subluxation of the talus relative to the tibial plafond. There is diffuse soft tissue edema. Right foot: Frontal and lateral views of the right foot demonstrate a small avulsion fracture off the dorsal distal margin of the talus. There is dorsal subluxation of the talus relative to the tibial plafond. No other acute bony abnormalities. IMPRESSION: 1. Trimalleolar fracture as above. 2. Dorsal subluxation of the talus relative to the tibial plafond. 3. Small avulsion fracture dorsal distal margin of the talus. 4. Diffuse soft tissue edema. Electronically Signed   By: Randa Ngo M.D.   On: 06/28/2020 20:44   ECHOCARDIOGRAM COMPLETE  Result Date: 06/27/2020    ECHOCARDIOGRAM REPORT   Patient Name:   KEANU LESNIAK Date of Exam: 06/27/2020 Medical Rec #:  [id number]        Height:  72.0 in Accession #:    [id number]       Weight:       267.0 lb Date of Birth:  [birth year]       BSA:          2.409 m Patient Age:    68 years         BP:           118/88 mmHg Patient Gender: M                HR:           81 bpm. Exam Location:  Inpatient Procedure: 2D  Echo Indications:    pulmonary embolus 415.19  History:        Patient has no prior history of Echocardiogram examinations.                 Risk Factors:Diabetes and Hypertension.  Sonographer:    Johny Chess Referring Phys: [id number] LAURA R GLEASON  Sonographer Comments: Image acquisition challenging due to patient body habitus. IMPRESSIONS  1. Left ventricular ejection fraction, by estimation, is 60 to 65%. The left ventricle has normal function. The left ventricle has no regional wall motion abnormalities. There is mild concentric left ventricular hypertrophy. Left ventricular diastolic parameters are consistent with Grade I diastolic dysfunction (impaired relaxation). There is the interventricular septum is flattened in systole and diastole, consistent with right ventricular pressure and volume overload.  2. Right ventricular systolic function is moderately reduced. The right ventricular size is moderately enlarged. There is moderately elevated pulmonary artery systolic pressure. The estimated right ventricular systolic pressure is 49.4 mmHg.  3. The mitral valve is normal in structure. No evidence of mitral valve regurgitation. No evidence of mitral stenosis.  4. The aortic valve is tricuspid. Aortic valve regurgitation is not visualized. Mild aortic valve sclerosis is present, with no evidence of aortic valve stenosis.  5. The inferior vena cava is dilated in size with <50% respiratory variability, suggesting right atrial pressure of 15 mmHg. FINDINGS  Left Ventricle: Left ventricular ejection fraction, by estimation, is 60 to 65%. The left ventricle has normal function. The left ventricle has no regional wall motion abnormalities. The left ventricular internal cavity size was normal in size. There is  mild concentric left ventricular hypertrophy. The interventricular septum is flattened in systole and diastole, consistent with right ventricular pressure and volume overload. Left ventricular diastolic  parameters are consistent with Grade I diastolic dysfunction (impaired relaxation). Normal left ventricular filling pressure. Right Ventricle: The right ventricular size is moderately enlarged. No increase in right ventricular wall thickness. Right ventricular systolic function is moderately reduced. There is moderately elevated pulmonary artery systolic pressure. The tricuspid  regurgitant velocity is 2.55 m/s, and with an assumed right atrial pressure of 15 mmHg, the estimated right ventricular systolic pressure is 49.6 mmHg. Left Atrium: Left atrial size was normal in size. Right Atrium: Right atrial size was normal in size. Pericardium: There is no evidence of pericardial effusion. Mitral Valve: The mitral valve is normal in structure. No evidence of mitral valve regurgitation. No evidence of mitral valve stenosis. Tricuspid Valve: The tricuspid valve is normal in structure. Tricuspid valve regurgitation is mild . No evidence of tricuspid stenosis. Aortic Valve: The aortic valve is tricuspid. Aortic valve regurgitation is not visualized. Mild aortic valve sclerosis is present, with no evidence of aortic valve stenosis. Pulmonic Valve: The pulmonic valve was normal in structure. Pulmonic valve regurgitation is trivial. No evidence of pulmonic  stenosis. Aorta: The aortic root is normal in size and structure. Venous: The inferior vena cava is dilated in size with less than 50% respiratory variability, suggesting right atrial pressure of 15 mmHg. IAS/Shunts: No atrial level shunt detected by color flow Doppler.  LEFT VENTRICLE PLAX 2D LVIDd:         4.00 cm  Diastology LVIDs:         3.10 cm  LV e' medial:    5.98 cm/s LV PW:         1.30 cm  LV E/e' medial:  9.5 LV IVS:        1.40 cm  LV e' lateral:   6.20 cm/s LVOT diam:     2.00 cm  LV E/e' lateral: 9.1 LV SV:         47 LV SV Index:   20 LVOT Area:     3.14 cm  RIGHT VENTRICLE             IVC RV Basal diam:  4.71 cm     IVC diam: 2.30 cm RV S prime:     11.70  cm/s LEFT ATRIUM             Index       RIGHT ATRIUM           Index LA diam:        3.50 cm 1.45 cm/m  RA Area:     14.20 cm LA Vol (A2C):   45.8 ml 19.01 ml/m RA Volume:   33.10 ml  13.74 ml/m LA Vol (A4C):   40.1 ml 16.65 ml/m LA Biplane Vol: 43.2 ml 17.93 ml/m  AORTIC VALVE LVOT Vmax:   93.30 cm/s LVOT Vmean:  59.200 cm/s LVOT VTI:    0.150 m  AORTA Ao Root diam: 3.50 cm Ao Asc diam:  3.50 cm MV E velocity: 56.60 cm/s  TRICUSPID VALVE MV A velocity: 74.10 cm/s  TR Peak grad:   26.0 mmHg MV E/A ratio:  0.76        TR Vmax:        255.00 cm/s                             SHUNTS                            Systemic VTI:  0.15 m                            Systemic Diam: 2.00 cm Fransico Him MD Electronically signed by Fransico Him MD Signature Date/Time: 06/27/2020/11:01:08 AM    Final    VAS [place] LOWER EXTREMITY VENOUS (DVT)  Result Date: 06/27/2020  Lower Venous DVTStudy Indications: Pulmonary embolism.  Comparison Study: No prior study on file Performing Technologist: Sharion Dove RVS  Examination Guidelines: A complete evaluation includes B-mode imaging, spectral Doppler, color Doppler, and power Doppler as needed of all accessible portions of each vessel. Bilateral testing is considered an integral part of a complete examination. Limited examinations for reoccurring indications may be performed as noted. The reflux portion of the exam is performed with the patient in reverse Trendelenburg.  +---------+---------------+---------+-----------+----------+--------------+  RIGHT     Compressibility Phasicity Spontaneity Properties Thrombus Aging  +---------+---------------+---------+-----------+----------+--------------+  CFV       Full  Yes       Yes                                    +---------+---------------+---------+-----------+----------+--------------+  SFJ       Full                                                              +---------+---------------+---------+-----------+----------+--------------+  FV Prox   Full                                                             +---------+---------------+---------+-----------+----------+--------------+  FV Mid    Full                                                             +---------+---------------+---------+-----------+----------+--------------+  FV Distal Full                                                             +---------+---------------+---------+-----------+----------+--------------+  PFV       Full                                                             +---------+---------------+---------+-----------+----------+--------------+  POP       Full            Yes       Yes                                    +---------+---------------+---------+-----------+----------+--------------+  PTV       Full                                                             +---------+---------------+---------+-----------+----------+--------------+  PERO      Full                                                             +---------+---------------+---------+-----------+----------+--------------+   +---------+---------------+---------+-----------+----------+--------------+  LEFT      Compressibility Phasicity  Spontaneity Properties Thrombus Aging  +---------+---------------+---------+-----------+----------+--------------+  CFV       Full            Yes       Yes                                    +---------+---------------+---------+-----------+----------+--------------+  SFJ       Full                                                             +---------+---------------+---------+-----------+----------+--------------+  FV Prox   Full                                                             +---------+---------------+---------+-----------+----------+--------------+  FV Mid    Full                                                              +---------+---------------+---------+-----------+----------+--------------+  FV Distal Full                                                             +---------+---------------+---------+-----------+----------+--------------+  PFV       Full                                                             +---------+---------------+---------+-----------+----------+--------------+  POP       None            No        Yes                    Acute           +---------+---------------+---------+-----------+----------+--------------+  PTV       Partial                                          Acute           +---------+---------------+---------+-----------+----------+--------------+  PERO      Partial                                          Acute           +---------+---------------+---------+-----------+----------+--------------+     Summary: RIGHT: -  There is no evidence of deep vein thrombosis in the lower extremity.  LEFT: - Findings consistent with acute deep vein thrombosis involving the left popliteal vein, left posterior tibial veins, and left peroneal veins.  *See table(s) above for measurements and observations. Electronically signed by Ruta Hinds MD on 06/27/2020 at 5:21:22 PM.    Final     Scheduled Meds:  Chlorhexidine Gluconate Cloth  6 each Topical Daily   insulin aspart  0-5 Units Subcutaneous QHS   insulin aspart  0-9 Units Subcutaneous TID WC   insulin glargine  10 Units Subcutaneous QHS   Continuous Infusions:  heparin 1,500 Units/hr (06/29/20 0525)     LOS: 3 days   Time spent: 24min  Edwena Mayorga C Tyjay Galindo, DO [facility]  If 7PM-7AM, please contact night-coverage [email or web address]  06/29/2020, 7:28 AM

## 2020-06-29 NOTE — Progress Notes (Addendum)
[place] [place] for heparin Indication: pulmonary embolus  HEPARIN DW (KG): 104.2   Assessment: 35 YOM who presents with massive PE and saddle embolus and associated R heart strain. Patient to receive systemic thrombolysis with IV tPA. Pharmacy consulted to start IV heparin post tPA (tPA finished ~0100 9/20) -heparin level = 0.13   Goal of Therapy:  Heparin level= 0.3 - 0.7 units/mL  Monitor platelets by anticoagulation protocol: Yes  Plan -Increase infusion to 1800 units/hr -Heparin level in 6 hours and daily wth CBC daily -Will follow oral anticoagulation plans  Hildred Laser, PharmD Clinical Pharmacist **Pharmacist phone directory can now be found on [email or web address] ([email or web address]).  Listed under [place].

## 2020-06-29 NOTE — Consult Note (Signed)
ORTHOPAEDIC CONSULTATION  REQUESTING PHYSICIAN: Little Ishikawa, MD  Chief Complaint: right ankle pain  HPI: Keith [name] is a 69 y.o. male who complains of right ankle pain after fall. He has a history of chronic back pain for which he is on pain management as well as a crush injury to his left foot and is currently admitted for a saddle PE. Patient states that yesterday evening he got up to use the restroom and had urinated on the floor. He often has muscle spasms through bilateral legs, this occurred while trying to move away from the toilet, his legs gave out and he fell onto the floor. . States he felt no pain in right ankle until this morning, denies hitting his head or loss of consciousness. Unable to bear weight when working with PT today. Tonight right ankle pain is a 9/10, worse with movement or palpation, improved with rest and mildly with pain medication. X-rays performed shoe a trimalleolar ankle fracture with dorsal dislocation. Patient denies parasthesias, nausea, vomiting, or calf pain.    Past Medical History:  Diagnosis Date  . Allergy   . Chronic back pain   . Diabetes mellitus without complication ([place])   . Duodenal ulcer hemorrhage 08/29/2014  . ED (erectile dysfunction)   . Esophageal stricture 08/30/2014  . Glucose intolerance (impaired glucose tolerance)   . Hiatal hernia 08/30/2014  . Hypertension   . Hypokalemia 04/11/2013  . 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 Obesity   . Osteoarthritis   . Scoliosis 2016  . Spinal stenosis of lumbar region   . Urinary tract infection 04/11/2013   Past Surgical History:  Procedure Laterality Date  . COLONOSCOPY  2008  . ESOPHAGOGASTRODUODENOSCOPY N/A 08/29/2014   Procedure: ESOPHAGOGASTRODUODENOSCOPY (EGD);  Surgeon: Lafayette Dragon, MD;  Location: [facility];  Service: Endoscopy;  Laterality: N/A;  . LAMINECTOMY N/A 07/28/2018   Procedure: THORACIC ELEVEN- THORACIC TWELVE POSTERIOR DECOMPRESSION LAMINECTOMY;  Surgeon:  Judith Part, MD;  Location: [facility];  Service: Neurosurgery;  Laterality: N/A;  . LUMBAR LAMINECTOMY    . NASAL POLYP SURGERY    . TONSILLECTOMY     Social History   Socioeconomic History  . Marital status: Married    Spouse name: Not on file  . Number of children: Not on file  . Years of education: Not on file  . Highest education level: Not on file  Occupational History  . Not on file  Tobacco Use  . Smoking status: Former Smoker    Packs/day: 1.00    Years: 25.00    Pack years: 25.00  . Smokeless tobacco: Never Used  Vaping Use  . Vaping Use: Never used  Substance and Sexual Activity  . Alcohol use: Not Currently    Comment: Christmas/4th of July  . Drug use: No  . Sexual activity: Yes    Birth control/protection: None  Other Topics Concern  . Not on file  Social History Narrative   Married.     Social Determinants of Health   Financial Resource Strain:   . Difficulty of Paying Living Expenses: Not on file  Food Insecurity:   . Worried About Charity fundraiser in the Last Year: Not on file  . Ran Out of Food in the Last Year: Not on file  Transportation Needs:   . Lack of Transportation (Medical): Not on file  . Lack of Transportation (Non-Medical): Not on file  Physical Activity:   . Days of Exercise per Week: Not on file  .  Minutes of Exercise per Session: Not on file  Stress:   . Feeling of Stress : Not on file  Social Connections:   . Frequency of Communication with Friends and Family: Not on file  . Frequency of Social Gatherings with Friends and Family: Not on file  . Attends Religious Services: Not on file  . Active Member of Clubs or Organizations: Not on file  . Attends Archivist Meetings: Not on file  . Marital Status: Not on file   Family History  Problem Relation Age of Onset  . Diabetes Mother   . Asthma Mother   . Hypertension Father   . Stroke Father   . Colon cancer Neg Hx    Allergies  Allergen Reactions  .  Lisinopril Other (See Comments)    Caused a body ache     Positive ROS: All other systems have been reviewed and were otherwise negative with the exception of those mentioned in the HPI and as above.  Physical Exam: General: Alert, laying in bed, in no acute distress Cardiovascular: No pedal edema Respiratory: No cyanosis, no use of accessory musculature GI: No organomegaly, abdomen is soft and non-tender Skin: No lesions noted on RLE Neurologic: Sensation intact distally Psychiatric: Patient is competent for consent with normal mood and affect Lymphatic: No axillary or cervical lymphadenopathy  MUSCULOSKELETAL: RLE - mild edema noted at right ankle. TTP to medial and lateral malleoli. Able to flex and extend all toes of right foot. Sensation intact to all aspects of right foot. DP pulse found on doppler. No calf TTP, calf compressible.   Assessment/Plan: Right trimalleolar ankle fracture with dorsal dislocation: - This is an acute severe injury, carries risk for posttraumatic arthrosis, recommend urgent closed reduction. - patient will need an ORIF which will likely need to be later in the week due to current anticoagulation - patient on pain management regimen of oxycontin, continue oxycodone   Preprocedure diagnosis: right trimalleolar ankle fracture dislocation Postprocedure diagnosis: same Procedure: Closed reduction and splinting, right ankle fracture dislocation Procedure details: After informed consent was obtained from the patient, 2 mg morphine was given by nurse and close reduction performed by myself. Satisfactory reduction was achieved manually, and a posterior splint was applied. + DP pulse confirmed with doppler. Postreduction x-rays and CAT scan are ordered, and pending. He tolerated the procedure well and there were no complications.      Ventura Bruns, PA-C   06/29/2020 12:36 AM

## 2020-06-29 NOTE — TOC Initial Note (Signed)
Transition of Care [facility]) - Initial/Assessment Note    Patient Details  Name: Keith [name]: [id number] Date of Birth: 1951/02/19  [facility] CM/SW Contact:    Vinie Sill, [place] Phone Number: 06/29/2020, 2:05 PM  Clinical Narrative:                  CSW met with patient at bedside. CSW introduced self and explained role. CSW discussed with patient possible discharge plan. Patient he lives in the home with his spouse. CSW explained current recommendation is HH but since he has fallen, PT recommendations may change. CSW inquired if he would interested in short term rehab at SNF if recommended. Patient states he has never been to SNF before and his preference is to go home. No preferred [facility] agency. Patient states his wife is retired and he has 5 children that can assist with taking care of home. However, he states he will do whatever it takes to get better and was agreeable to SNF as back up plan. CSW was given permission to send out referrals. Patient has received covid vaccines.  CSW will continue to follow for disposition and assist with discharge planning.  Thurmond Butts, MSW, [place] Clinical Social Worker    Expected Discharge Plan: [place] Barriers to Discharge: Continued Medical Work up   Patient Goals and CMS Choice        Expected Discharge Plan and Services Expected Discharge Plan: [place]       Living arrangements for the past 2 months: Single Family Home                                      Prior Living Arrangements/Services Living arrangements for the past 2 months: Single Family Home Lives with:: Self, Spouse Patient language and need for interpreter reviewed:: No        Need for Family Participation in Patient Care: Yes (Comment) Care giver support system in place?: Yes (comment)   Criminal Activity/Legal Involvement Pertinent to Current Situation/Hospitalization: No - Comment as  needed  Activities of Daily Living Home Assistive Devices/Equipment: Cane (specify quad or straight), Walker (specify type) (Straight cane, 4 wheel walker with brakes) ADL Screening (condition at time of admission) Patient's cognitive ability adequate to safely complete daily activities?: Yes Is the patient deaf or have difficulty hearing?: No Does the patient have difficulty seeing, even when wearing glasses/contacts?: No Does the patient have difficulty concentrating, remembering, or making decisions?: No Patient able to express need for assistance with ADLs?: Yes Does the patient have difficulty dressing or bathing?: Yes Independently performs ADLs?: Yes (appropriate for developmental age) Does the patient have difficulty walking or climbing stairs?: Yes Weakness of Legs: Both Weakness of Arms/Hands: None  Permission Sought/Granted Permission sought to share information with : Family Supports Permission granted to share information with : Yes, Verbal Permission Granted  Share Information with NAME: Rudi Bunyard     Permission granted to share info w Relationship: spouse  Permission granted to share info w Contact Information: [phone]  Emotional Assessment Appearance:: Appears stated age Attitude/Demeanor/Rapport: Self-Confident, Engaged Affect (typically observed): Accepting, Pleasant, Appropriate Orientation: : Oriented to Self, Oriented to Place, Oriented to  Time, Oriented to Situation Alcohol / Substance Use: Not Applicable Psych Involvement: No (comment)  Admission diagnosis:  Shock ([place]) [R57.9] Pulmonary embolism (HCC) [I26.99] Acute saddle pulmonary embolism without  acute cor pulmonale ([place]) [I26.92] Patient Active Problem List   Diagnosis Date Noted  . Acute respiratory failure with hypoxia ([place])   . Pulmonary embolism ([place]) 06/26/2020  . Lumbar spinal stenosis 06/01/2020  . Spinal cord compression ([place]) 04/02/2020  . Thoracic myelopathy 07/25/2018  .  Hyperlipidemia associated with type 2 diabetes mellitus ([place]) 11/14/2016  . Type 2 diabetes mellitus with diabetic neuropathy, unspecified ([place]) 11/14/2016  . Routine general medical examination at a health care facility 11/14/2015  . Hiatal hernia 08/30/2014  . AKI (acute kidney injury) ([place]) 04/10/2013  . Allergic rhinitis 04/10/2013  . Spinal stenosis of lumbar region 11/06/2012  . Allergic rhinitis due to pollen 01/15/2011  . OSTEOARTHRITIS 11/02/2008  . HIP PAIN, LEFT 02/09/2008  . Morbid obesity ([place]) 11/07/2007  . ERECTILE DYSFUNCTION, MILD 11/07/2007  . Essential hypertension 11/07/2007   PCP:  [name], [name], MD Pharmacy:   [id number] , [place] [address]Phone: [phone]: [phone] [facility] [facility] [place] [address]Phone: [phone]: [phone]    Social Determinants of Health (SDOH) Interventions    Readmission Risk Interventions No flowsheet data found.

## 2020-06-29 NOTE — Progress Notes (Signed)
ANTICOAGULATION CONSULT NOTE - Follow Up Consult  Pharmacy Consult for heparin Indication: pulmonary embolus  Labs: Recent Labs    06/26/20 2001 06/26/20 2001 06/26/20 2006 06/26/20 2153 06/27/20 0101 06/27/20 0101 06/27/20 0419 06/27/20 0419 06/27/20 0943 06/27/20 2000 06/28/20 0207 06/29/20 0415  HGB 12.2*   < > 13.3   < > 10.7*   < > 10.5*   < >  --   --  9.8* 10.7*  HCT 40.7   < > 39.0   < > 33.7*   < > 31.0*  --   --   --  30.5* 32.5*  PLT 353  --   --    < > 289  --   --   --   --   --  259 304  APTT  --   --   --   --  72*  --   --   --   --   --   --   --   LABPROT 14.2  --   --   --   --   --   --   --   --   --   --   --   INR 1.1  --   --   --   --   --   --   --   --   --   --   --   HEPARINUNFRC  --   --   --   --   --   --   --   --    < > 0.38 0.36 0.24*  CREATININE 2.00*   < > 1.70*  --  1.95*  --   --   --   --   --  1.32*  --   TROPONINIHS 45*  --   --   --   --   --   --   --   --   --   --   --    < > = values in this interval not displayed.    Assessment: 69yo male sub therapeutic on heparin after two levels at lower end of goal; no gtt issues or signs of bleeding per RN.  Goal of Therapy:  Heparin level 0.3-0.7 units/ml   Plan:  Will increase heparin gtt by 2 units/kg/hr to 1500 units/hr and check level in 6 hours.    Wynona Neat, PharmD, BCPS  06/29/2020,5:19 AM

## 2020-06-29 NOTE — Evaluation (Signed)
Occupational Therapy Evaluation Patient Details Name: Keith [name] MRN: [id number] DOB: 27-Mar-1951 Today's Date: 06/29/2020    History of Present Illness 69 yo admitted with near syncope after going to the bathroom. Pt with saddle PE with obstructive shock s/p tPA and heparin. 9/21 pt with slip on wet floor while standing with right ankle pain. PMhx: chronic back pain, opiate use, DM, scoliosis, T11-12 lami   Clinical Impression   Patient admitted with the above diagnosis.  Patient unfortunately fell at [facility] ambulating with staff from the [place], had his knees buckle from chronic back issues, landed on and twisted his R ankle fracturing it.  He is scheduled for an ORIF this week.  Recent injury to R ankle, declines to activity tolerance, NWB status to R ankle, declines to stand balance, decreased knowledge of DME usage, significantly impair his ADL and mobility independence.  Patient was independent with care and mobility at home with his spouse.  He has been experiencing the knee buckle prior, and was in consult with a spine surgeon.  Patient is willing to consider a SNF, but wants to try for home, with assist, needed DME and [facility] services.  Continue to follow in the acute setting.  Final recommendation of home versus SNF will depend on progress.        Follow Up Recommendations  SNF    Equipment Recommendations  3 in 1 bedside commode    Recommendations for Other Services       Precautions / Restrictions Precautions Precautions: Fall Precaution Comments: R ankle fx with planned ORIF Required Braces or Orthoses: Splint/Cast Splint/Cast: R ankle Restrictions Weight Bearing Restrictions: Yes RLE Weight Bearing: Non weight bearing Other Position/Activity Restrictions: old injury to L ankle      Mobility Bed Mobility Overal bed mobility: Needs Assistance         Sit to supine: Min assist;HOB elevated   General bed mobility comments: assist bringing casted leg onto the  bed.  Transfers Overall transfer level: Needs assistance   Transfers: Sit to/from Stand Sit to Stand: Min assist;From elevated surface         General transfer comment: OT with foot under R ankle.  RW.  Stood times one.  Educated patient on future stand pivot to [facility].    Balance Overall balance assessment: Needs assistance Sitting-balance support: No upper extremity supported Sitting balance-Leahy Scale: Good     Standing balance support: Bilateral upper extremity supported Standing balance-Leahy Scale: Fair Standing balance comment: hx of knee buckle from back issue                           ADL either performed or assessed with clinical judgement   ADL Overall ADL's : Needs assistance/impaired Eating/Feeding: Independent;Bed level   Grooming: Wash/dry hands;Wash/dry face;Oral care;Set up;Sitting Grooming Details (indicate cue type and reason): EOB Upper Body Bathing: Set up;Sitting Upper Body Bathing Details (indicate cue type and reason): EOB     Upper Body Dressing : Set up;Sitting   Lower Body Dressing: Moderate assistance;Sitting/lateral leans                       Vision Baseline Vision/History: Wears glasses Patient Visual Report: No change from baseline       Perception     Praxis      Pertinent Vitals/Pain Pain Score: 2  Pain Location: R ankle Pain Descriptors / Indicators: Sore Pain Intervention(s): Monitored during session;Repositioned  Hand Dominance Right   Extremity/Trunk Assessment Upper Extremity Assessment Upper Extremity Assessment: Overall WFL for tasks assessed   Lower Extremity Assessment Lower Extremity Assessment: Defer to PT evaluation   Cervical / Trunk Assessment Cervical / Trunk Assessment: Other exceptions Cervical / Trunk Exceptions: 2 prior back surgeries.  consulting for a third due to knee buckle and shooting pains down B legs.  L-spine.   Communication Communication Communication: No  difficulties   Cognition Arousal/Alertness: Awake/alert Behavior During Therapy: WFL for tasks assessed/performed Overall Cognitive Status: Within Functional Limits for tasks assessed                                                      Home Living Family/patient expects to be discharged to:: Private residence Living Arrangements: Spouse/significant other Available Help at Discharge: Family;Available 24 hours/day Type of Home: House Home Access: Stairs to enter [employer] of Steps: 2 Entrance Stairs-Rails: Right Home Layout: Two level   Alternate Level Stairs-Rails: Right Bathroom Shower/Tub: Occupational psychologist: Handicapped height Bathroom Accessibility: Yes How Accessible: Accessible via walker Home Equipment: [place] - 4 wheels;Cane - single point;Grab bars - toilet;Grab bars - tub/shower;Bedside commode;Adaptive equipment;Wheelchair - Designer, fashion/clothing: Reacher;Sock aid;Long-handled shoe horn;Long-handled sponge        Prior Functioning/Environment Level of Independence: Independent with assistive device(s)        Comments: rollator        OT Problem List: Decreased strength;Decreased activity tolerance;Impaired balance (sitting and/or standing);Decreased knowledge of use of DME or AE;Decreased knowledge of precautions;Pain      OT Treatment/Interventions: Self-care/ADL training;Therapeutic exercise;DME and/or AE instruction;Therapeutic activities;Balance training    OT Goals(Current goals can be found in the care plan section) Acute Rehab OT Goals Patient Stated Goal: I need to figure out the best way to move. OT Goal Formulation: With patient Time For Goal Achievement: 07/13/20 Potential to Achieve Goals: Good ADL Goals Pt Will Perform Lower Body Dressing: with min assist;sit to/from stand Pt Will Transfer to Toilet: with min guard assist;stand pivot transfer;bedside commode Pt Will Perform  Toileting - Clothing Manipulation and hygiene: with min guard assist;sit to/from stand Pt Will Perform Tub/Shower Transfer: with supervision;Stand pivot transfer;tub bench  OT Frequency: Min 2X/week   Barriers to D/C: Decreased caregiver support          Co-evaluation              AM-PAC OT "6 Clicks" Daily Activity     Outcome Measure Help from another person eating meals?: None Help from another person taking care of personal grooming?: None Help from another person toileting, which includes using toliet, bedpan, or urinal?: A Lot Help from another person bathing (including washing, rinsing, drying)?: A Lot Help from another person to put on and taking off regular upper body clothing?: None Help from another person to put on and taking off regular lower body clothing?: A Lot 6 Click Score: 18   End of Session Equipment Utilized During Treatment: Gait belt;Rolling walker Nurse Communication: Other (comment) (positoning of patient)  Activity Tolerance: Patient tolerated treatment well Patient left: in bed;with call bell/phone within reach  OT Visit Diagnosis: Unsteadiness on feet (R26.81);Repeated falls (R29.6);Muscle weakness (generalized) (M62.81);Pain Pain - Right/Left: Right Pain - part of body: Ankle and joints of foot  Time: 1500-1531 OT Time Calculation (min): 31 min Charges:  OT General Charges $OT Visit: 1 Visit OT Evaluation $OT Eval Moderate Complexity: 1 Mod OT Treatments $Therapeutic Activity: 8-22 mins  06/29/2020  Rich, OTR/L  Acute Rehabilitation Services  Office:  [place] 06/29/2020, 3:44 PM

## 2020-06-29 NOTE — Progress Notes (Signed)
Orthopedic Tech Progress Note Patient Details:  Keith [name] July 05, 1951 [id number]  Ortho Devices Type of Ortho Device: Post (short) splint, Stirrup splint Splint Material: Plaster Ortho Device/Splint Location: Right Lower Extremity Ortho Device/Splint Interventions: Ordered, Application   Post Interventions Patient Tolerated: Well Instructions Provided: Adjustment of device, Care of device, Poper ambulation with device  Assisted in application of posterior short leg splint with stirrups by PA-C. Tammy Sours 06/29/2020, 12:25 AM

## 2020-06-30 LAB — CBC
HCT: 32.6 % — ABNORMAL LOW (ref 39.0–52.0)
Hemoglobin: 10.4 g/dL — ABNORMAL LOW (ref 13.0–17.0)
MCH: 30.2 pg (ref 26.0–34.0)
MCHC: 31.9 g/dL (ref 30.0–36.0)
MCV: 94.8 fL (ref 80.0–100.0)
Platelets: 337 10*3/uL (ref 150–400)
RBC: 3.44 MIL/uL — ABNORMAL LOW (ref 4.22–5.81)
RDW: 14.7 % (ref 11.5–15.5)
WBC: 7 10*3/uL (ref 4.0–10.5)
nRBC: 0.3 % — ABNORMAL HIGH (ref 0.0–0.2)

## 2020-06-30 LAB — GLUCOSE, CAPILLARY
Glucose-Capillary: 155 mg/dL — ABNORMAL HIGH (ref 70–99)
Glucose-Capillary: 156 mg/dL — ABNORMAL HIGH (ref 70–99)
Glucose-Capillary: 121 mg/dL — ABNORMAL HIGH (ref 70–99)
Glucose-Capillary: 146 mg/dL — ABNORMAL HIGH (ref 70–99)

## 2020-06-30 LAB — HEPARIN LEVEL (UNFRACTIONATED)
Heparin Unfractionated: 0.41 IU/mL (ref 0.30–0.70)
Heparin Unfractionated: 0.47 IU/mL (ref 0.30–0.70)

## 2020-06-30 MED ORDER — POVIDONE-IODINE 10 % EX SWAB: 2.0000 "application " | Freq: Once | CUTANEOUS | Status: DC

## 2020-06-30 MED ORDER — CEFAZOLIN SODIUM-DEXTROSE 2-4 GM/100ML-% IV SOLN
2.0000 g | INTRAVENOUS | Status: AC
  Administered 2020-07-01: 2 g via INTRAVENOUS
  Filled 2020-06-30: qty 100

## 2020-06-30 MED ORDER — CHLORHEXIDINE GLUCONATE 4 % EX LIQD
60.0000 mL | Freq: Once | CUTANEOUS | Status: AC
  Administered 2020-07-01: 4 via TOPICAL
  Filled 2020-06-30: qty 60

## 2020-06-30 MED ORDER — ENSURE PRE-SURGERY PO LIQD
296.0000 mL | Freq: Once | ORAL | Status: AC
  Administered 2020-06-30: 296 mL via ORAL
  Filled 2020-06-30: qty 296

## 2020-06-30 MED ORDER — ACETAMINOPHEN 500 MG PO TABS: 1000.0000 mg | ORAL_TABLET | Freq: Once | ORAL | Status: DC

## 2020-06-30 NOTE — Progress Notes (Signed)
[place] for heparin Indication: pulmonary embolus  HEPARIN DW (KG): 104.2   Assessment: 73 YOM who presents with massive PE and saddle embolus and associated R heart strain. Patient to receive systemic thrombolysis with IV tPA. Pharmacy consulted to start IV heparin post tPA (tPA finished ~0100 9/20) -heparin level = 0.13  9/23 AM update:  Heparin level therapeutic after rate increase   Goal of Therapy:  Heparin level= 0.3 - 0.7 units/mL  Monitor platelets by anticoagulation protocol: Yes  Plan Cont heparin 1800 units/hr Confirmatory heparin level in 6-8 hours  Narda Bonds, PharmD, [place] Pharmacist Phone: [phone]

## 2020-06-30 NOTE — Progress Notes (Signed)
PROGRESS NOTE    Keith [name]  UYQ:[id number] DOB: 1950/11/28 DOA: 06/26/2020 PCP: [name], [name], MD   Brief Narrative:  Pt presented with near syncope after returning from the bathroom.  Also complained of chest pain like "elephant sitting on his chest" noted to be hypoxic with saturation in the 80s, blood pressure in the 50s, brought in by EMS as a code STEMI .  Placed on nonrebreather, initial blood pressure 70s, started on Levophed, central line placed in ED, CT angiogram obtained which confirmed saddle PE. Recent admission 3 weeks ago for acute on chronic back pain he is on chronic opiates for back pain for at least 2 years by neurosurgery.  He reports since his admission he has mostly been sitting around, limited movement.   Patient admitted as above with atypical chest pain hypoxia found to have massive saddle PE as well as left lower extremity DVT likely provoked in the setting of poor ambulatory status and prolonged sedentary lifestyle.  Patient tolerated TPA quite well, now on heparin drip likely transition over to p.o. anticoagulation the next 24 to 48 hours pending clinical course.   Assessment & Plan:   Active Problems:   AKI (acute kidney injury) ([place])   Pulmonary embolism (HCC)   Acute respiratory failure with hypoxia (HCC)   Acute massive/saddle PE with hemodynamic compromise and near syncope, POA - Status post TPA followed by IV heparin  - 2D echo - without overt findings as below - Left lower extremity DVT noted on ultrasound - BP improving after lytics - continue holding home meds  Acute hypoxic respiratory failure -related to above, titrate oxygen as needed - Ambulatory oxygen screen daily, wean as tolerated SpO2: 99 % O2 Flow Rate (L/min): 1 L/min  Ambulatory dysfunction, acute mechanical fall  Right trimalleolar ankle fracture with dorsal dislocation - Orthopedics following, reduced 06/28/2020 - Likely ORIF in the next 24 hours pending surgical  schedule - Patient has known chronic ambulatory dysfunction, will continue PT/OT with close monitoring given high fall risk  AKI, resolving - Related to above, continue to advance diet, increase p.o. intake as tolerated Lab Results  Component Value Date   CREATININE 1.20 06/29/2020   CREATININE 1.32 (H) 06/28/2020   CREATININE 1.95 (H) 06/27/2020   Chronic opiate use - Resume oxycodone - Holding OxyContin for now - May require short acting narcotics postoperatively  Uncontrolled diabetes -likely related to steroids that he recently received - Start Lantus 10 units - SSI resistant scale - A1C 6.7  DVT prophylaxis: Heparin drip as above Code Status: Full Family Communication: Wife at bedside  Status is: Inpatient  Dispo: The patient is from: Home              Anticipated d/c is to: To be determined pending clinical course and ambulatory status              Anticipated d/c date is: 24 to 48 hours pending clinical course              Patient currently not medically stable for discharge given ongoing need for IV anticoagulation in the setting of saddle PE, disposition pending advancement of anticoagulation to p.o. as well as safe discharge location.  Consultants:   PCCM  Procedures:   TPA administration as above  Antimicrobials:  None  Subjective: No acute issues or events overnight, pain currently well controlled, denies nausea, vomiting, diarrhea, constipation, headache, fevers, chills.  Objective: Vitals:   06/30/20 0423 06/30/20 0531 06/30/20 6387 06/30/20  0736  BP: 114/76  (!) 164/91 111/84  Pulse:   88 95  Resp: 16  20 19   Temp: 99.2 F (37.3 C)  98.7 F (37.1 C) 98.5 F (36.9 C)  TempSrc: Oral  Oral Oral  SpO2: 96%  100% 99%  Weight:  116.6 kg    Height:        Intake/Output Summary (Last 24 hours) at 06/30/2020 0748 Last data filed at 06/30/2020 0651 Gross per 24 hour  Intake 240 ml  Output 2350 ml  Net -2110 ml   Filed Weights   06/28/20 0503  06/29/20 0616 06/30/20 0531  Weight: 120.1 kg 120.4 kg 116.6 kg    Examination:  General:  Pleasantly resting in bed, No acute distress. HEENT:  Normocephalic atraumatic.  Sclerae nonicteric, noninjected.  Extraocular movements intact bilaterally. Neck:  Without mass or deformity.  Trachea is midline. Lungs:  Clear to auscultate bilaterally without rhonchi, wheeze, or rales. Heart:  Regular rate and rhythm.  Without murmurs, rubs, or gallops. Abdomen:  Soft, nontender, nondistended.  Without guarding or rebound. Extremities: Right ankle bandage clean dry intact, sensation intact distally Vascular:  Dorsalis pedis and posterior tibial pulses palpable bilaterally. Skin:  Warm and dry, no erythema, no ulcerations.   Data Reviewed: I have personally reviewed following labs and imaging studies  CBC: Recent Labs  Lab 06/26/20 2001 06/26/20 2006 06/26/20 2153 06/26/20 2153 06/27/20 0101 06/27/20 0419 06/28/20 0207 06/29/20 0415 06/30/20 0206  WBC 8.6   < > 13.9*  --  12.0*  --  7.7 7.4 7.0  NEUTROABS 4.5  --   --   --   --   --   --   --   --   HGB 12.2*   < > 10.6*   < > 10.7* 10.5* 9.8* 10.7* 10.4*  HCT 40.7   < > 34.6*   < > 33.7* 31.0* 30.5* 32.5* 32.6*  MCV 101.2*   < > 99.7  --  96.6  --  94.7 95.6 94.8  PLT 353   < > 355  --  289  --  259 304 337   < > = values in this interval not displayed.   Basic Metabolic Panel: Recent Labs  Lab 06/26/20 2001 06/26/20 2001 06/26/20 2006 06/27/20 0101 06/27/20 0419 06/28/20 0207 06/29/20 0415  NA 141   < > 142 138 141 139 137  K 3.7   < > 3.6 4.3 4.1 3.4* 3.4*  CL 105  --  108 105  --  105 101  CO2 18*  --   --  22  --  23 22  GLUCOSE 328*  --  318* 204*  --  156* 154*  BUN 24*  --  27* 27*  --  19 11  CREATININE 2.00*  --  1.70* 1.95*  --  1.32* 1.20  CALCIUM 9.9  --   --  9.0  --  9.1 9.6  MG  --   --   --  2.3  --  2.0  --   PHOS  --   --   --  4.6  --  2.3*  --    < > = values in this interval not displayed.    GFR: Estimated Creatinine Clearance: 78.7 mL/min (by C-G formula based on SCr of 1.2 mg/dL). Liver Function Tests: Recent Labs  Lab 06/26/20 2001 06/29/20 0415  AST 98* 59*  ALT 56* 80*  ALKPHOS 88 91  BILITOT 0.8 0.8  PROT 7.7 7.2  ALBUMIN 3.5 3.6   No results for input(s): LIPASE, AMYLASE in the last 168 hours. No results for input(s): AMMONIA in the last 168 hours. Coagulation Profile: Recent Labs  Lab 06/26/20 2001  INR 1.1   Cardiac Enzymes: No results for input(s): CKTOTAL, CKMB, CKMBINDEX, TROPONINI in the last 168 hours. BNP (last 3 results) No results for input(s): PROBNP in the last 8760 hours. HbA1C: Recent Labs    06/27/20 2000  HGBA1C 6.7*   CBG: Recent Labs  Lab 06/28/20 2055 06/29/20 0622 06/29/20 1055 06/29/20 1632 06/30/20 0730  GLUCAP 132* 168* 142* 173* 155*   Lipid Profile: No results for input(s): CHOL, HDL, LDLCALC, TRIG, CHOLHDL, LDLDIRECT in the last 72 hours. Thyroid Function Tests: No results for input(s): TSH, T4TOTAL, FREET4, T3FREE, THYROIDAB in the last 72 hours. Anemia Panel: No results for input(s): VITAMINB12, FOLATE, FERRITIN, TIBC, IRON, RETICCTPCT in the last 72 hours. Sepsis Labs: Recent Labs  Lab 06/26/20 2124  LATICACIDVEN 2.1*    Recent Results (from the past 240 hour(s))  SARS Coronavirus 2 by RT PCR (hospital order, performed in [facility] hospital lab) Nasopharyngeal Nasopharyngeal Swab     Status: None   Collection Time: 06/26/20  9:10 PM   Specimen: Nasopharyngeal Swab  Result Value Ref Range Status   SARS Coronavirus 2 NEGATIVE NEGATIVE Final    Comment: (NOTE) SARS-CoV-2 target nucleic acids are NOT DETECTED.  The SARS-CoV-2 RNA is generally detectable in upper and lower respiratory specimens during the acute phase of infection. The lowest concentration of SARS-CoV-2 viral copies this assay can detect is 250 copies / mL. A negative result does not preclude SARS-CoV-2 infection and should not be  used as the sole basis for treatment or other patient management decisions.  A negative result may occur with improper specimen collection / handling, submission of specimen other than nasopharyngeal swab, presence of viral mutation(s) within the areas targeted by this assay, and inadequate number of viral copies (<250 copies / mL). A negative result must be combined with clinical observations, patient history, and epidemiological information.  Fact Sheet for Patients:   StrictlyIdeas.no  Fact Sheet for Healthcare Providers: BankingDealers.co.za  This test is not yet approved or  cleared by the [place] FDA and has been authorized for detection and/or diagnosis of SARS-CoV-2 by FDA under an Emergency Use Authorization (EUA).  This EUA will remain in effect (meaning this test can be used) for the duration of the COVID-19 declaration under Section 564(b)(1) of the Act, 21 U.S.C. section 360bbb-3(b)(1), unless the authorization is terminated or revoked sooner.  Performed at [facility], [place] [address], [place], [place] [address]   Culture, blood (routine x 2)     Status: None (Preliminary result)   Collection Time: 06/26/20  9:33 PM   Specimen: BLOOD  Result Value Ref Range Status   Specimen Description BLOOD CENTRAL LINE  Final   Special Requests   Final    BOTTLES DRAWN AEROBIC AND ANAEROBIC Blood Culture adequate volume   Culture   Final    NO GROWTH 3 DAYS Performed at [facility] [address] [place], [place] [address]    Report Status PENDING  Incomplete  MRSA PCR Screening     Status: None   Collection Time: 06/26/20 11:30 PM   Specimen: Nasopharyngeal  Result Value Ref Range Status   MRSA by PCR NEGATIVE NEGATIVE Final    Comment:        The  GeneXpert MRSA Assay (FDA approved for NASAL specimens only), is one component of a comprehensive MRSA colonization surveillance program. It is not intended  to diagnose MRSA infection nor to guide or monitor treatment for MRSA infections. Performed at [facility], [place] [address], [place], [place] [address]          Radiology Studies: DG Ankle Complete Right  Result Date: 06/28/2020 CLINICAL DATA:  Right foot and ankle pain EXAM: RIGHT ANKLE - COMPLETE 3+ VIEW; RIGHT FOOT - 2 VIEW COMPARISON:  None. FINDINGS: Right ankle: Frontal, oblique, and lateral views demonstrate an oblique fracture of the distal right fibula, with mild displacement. Small ossific densities are seen within the medial aspect of the ankle mortise, consistent with avulsion fractures. Exact donor site is not identified, though likely from the medial malleolus. Coronally oriented minimally displaced fracture of the posterior malleolus is seen on the lateral view. There is dorsal subluxation of the talus relative to the tibial plafond. There is diffuse soft tissue edema. Right foot: Frontal and lateral views of the right foot demonstrate a small avulsion fracture off the dorsal distal margin of the talus. There is dorsal subluxation of the talus relative to the tibial plafond. No other acute bony abnormalities. IMPRESSION: 1. Trimalleolar fracture as above. 2. Dorsal subluxation of the talus relative to the tibial plafond. 3. Small avulsion fracture dorsal distal margin of the talus. 4. Diffuse soft tissue edema. Electronically Signed   By: Randa Ngo M.D.   On: 06/28/2020 20:44   CT ANKLE RIGHT WO CONTRAST  Result Date: 06/29/2020 CLINICAL DATA:  Evaluate right ankle fracture. EXAM: CT OF THE RIGHT ANKLE WITHOUT CONTRAST TECHNIQUE: Multidetector CT imaging of the right ankle was performed according to the standard protocol. Multiplanar CT image reconstructions were also generated. COMPARISON:  Radiographs 06/28/2020 and 06/29/2020 FINDINGS: There is a mildly displaced oblique coursing fracture of the distal fibula at and above the level of the ankle mortise. Maximum  displacement is 7 mm. Small avulsion type fractures involving the distal tip of the medial malleolus. Complex comminuted intra-articular fracture involving the posterior malleolar region of the tibia. There is comminution at the articular surface posteriorly and maximum displacement of the articular component is 6.5 mm. Small fracture fragments are noted in the anterior tibiotalar joint space. The talus is intact. The subtalar joints are maintained. No calcaneal fracture. The visualized midfoot bony structures are intact. Grossly by CT the medial and lateral and anterior ankle tendons and ligaments are intact. The Achilles tendon is intact. Moderate subcutaneous soft tissue swelling/edema/fluid/hematoma. IMPRESSION: 1. Complex comminuted intra-articular fracture involving the posterior malleolar region of the tibia. There is comminution at the articular surface posteriorly and maximum displacement of the articular component is 6.5 mm. 2. Small avulsion type fractures involving the distal tip of the medial malleolus. 3. Mildly displaced oblique coursing fracture of the distal fibula at and above the level of the ankle mortise. Maximum displacement is 7 mm. Electronically Signed   By: Marijo Sanes M.D.   On: 06/29/2020 13:10   DG Ankle Right Port  Result Date: 06/29/2020 CLINICAL DATA:  Right ankle fracture reduction, EXAM: PORTABLE RIGHT ANKLE - 2 VIEW COMPARISON:  06/28/2020 FINDINGS: Two view radiograph of the right ankle spur formed within an external immobilizer which obscures fine bony detail. There has been interval reduction of tibiotalar subluxation. Medial joint space widening persists. Fracture fragments are in grossly anatomic alignment on this limited examination with persistent 2-3 mm posterior displacement and distraction  of the distal fibular fracture fragment. IMPRESSION: Interval reduction of right tibiotalar subluxation Electronically Signed   By: Fidela Salisbury MD   On: 06/29/2020 00:46   DG  Foot 2 Views Right  Result Date: 06/28/2020 CLINICAL DATA:  Right foot and ankle pain EXAM: RIGHT ANKLE - COMPLETE 3+ VIEW; RIGHT FOOT - 2 VIEW COMPARISON:  None. FINDINGS: Right ankle: Frontal, oblique, and lateral views demonstrate an oblique fracture of the distal right fibula, with mild displacement. Small ossific densities are seen within the medial aspect of the ankle mortise, consistent with avulsion fractures. Exact donor site is not identified, though likely from the medial malleolus. Coronally oriented minimally displaced fracture of the posterior malleolus is seen on the lateral view. There is dorsal subluxation of the talus relative to the tibial plafond. There is diffuse soft tissue edema. Right foot: Frontal and lateral views of the right foot demonstrate a small avulsion fracture off the dorsal distal margin of the talus. There is dorsal subluxation of the talus relative to the tibial plafond. No other acute bony abnormalities. IMPRESSION: 1. Trimalleolar fracture as above. 2. Dorsal subluxation of the talus relative to the tibial plafond. 3. Small avulsion fracture dorsal distal margin of the talus. 4. Diffuse soft tissue edema. Electronically Signed   By: Randa Ngo M.D.   On: 06/28/2020 20:44    Scheduled Meds: . Chlorhexidine Gluconate Cloth  6 each Topical Daily  . insulin aspart  0-5 Units Subcutaneous QHS  . insulin aspart  0-9 Units Subcutaneous TID WC  . insulin glargine  10 Units Subcutaneous QHS   Continuous Infusions: . heparin 1,800 Units/hr (06/30/20 0537)     LOS: 4 days   Time spent: 30min  Tallie Hevia C Reino Lybbert, DO [facility]  If 7PM-7AM, please contact night-coverage [email or web address]  06/30/2020, 7:48 AM

## 2020-06-30 NOTE — Care Management Important Message (Signed)
Important Message  Patient Details  Name: JUSTIS CLOSSER MRN: [id number] Date of Birth: [birth year]   Medicare Important Message Given:  Yes     Shelda Altes 06/30/2020, 10:36 AM

## 2020-06-30 NOTE — Progress Notes (Signed)
[facility] for heparin Indication: pulmonary embolus  HEPARIN DW (KG): 104.2   Assessment: 70 YOM who presents with massive PE and saddle embolus and associated R heart strain. Patient to receive systemic thrombolysis with IV tPA. Pharmacy consulted to start IV heparin post tPA (tPA finished ~0100 9/20). He is noted with an ankle fracture and plans noted for OR on 9/24.  -heparin level = 0.47 and confirmed at goal   Goal of Therapy:  Heparin level= 0.3 - 0.7 units/mL  Monitor platelets by anticoagulation protocol: Yes  Plan -Increase infusion to 1800 units/hr -Heparin level  daily with CBC daily -Will follow plans post procedure 9/24  Hildred Laser, PharmD Clinical Pharmacist **Pharmacist phone directory can now be found on [email or web address] ([email or web address]).  Listed under [place].

## 2020-06-30 NOTE — Progress Notes (Signed)
Physical Therapy Treatment Patient Details Name: Keith [name] MRN: [id number] DOB: [birth year] Today's Date: 06/30/2020    History of Present Illness Pt is a 69 y.o. male admitted 06/26/20 with near syncope after going to the bathroom. Pt with saddle PE with obstructive shock s/p tPA and heparin. On 9/21, pt with slip on wet floor while standing, sustained R trimalleolar ankle fracture with dorsal dislocation. Plan for ORIF 9/24. PMH includes chronic back pain, opiate use, DM, scoliosis, T11-12 laminectomy.   PT Comments    Pt slowly progressing with mobility. Limited by generalized weakness, decreased activity tolerance, impaired balance strategies and inability to WB through RLE; at high risk for falls. Requires modA for stand pivot to recliner, unable to clear LLE to hop for gait progression this session. Increased time discussing d/c recommendations and return home with pt and wife. Recommend intensive CIR-level therapies to maximize functional mobility and independence prior to return home; pt and wife in agreement.    Follow Up Recommendations  CIR;Supervision for mobility/OOB     Equipment Recommendations   (defer)    Recommendations for Other Services       Precautions / Restrictions Precautions Precautions: Fall Required Braces or Orthoses: Splint/Cast Splint/Cast: R ankle Restrictions Weight Bearing Restrictions: Yes RLE Weight Bearing: Non weight bearing Other Position/Activity Restrictions: old injury to L ankle with decreased stability    Mobility  Bed Mobility Overal bed mobility: Needs Assistance Bed Mobility: Supine to Sit     Supine to sit: Modified independent (Device/Increase time);HOB elevated     General bed mobility comments: Increased time and effort, reliant on BUE support to assist BLEs to EOB  Transfers Overall transfer level: Needs assistance   Transfers: Sit to/from Stand;Stand Pivot Transfers Sit to Stand: Mod assist Stand pivot  transfers: Mod assist       General transfer comment: Able to stand on second attempt, reliant on momentum and modA to assist trunk elevation, difficulty with transtion of BUE support from bed to RW; foot under R ankle as pt with difficulty maintaining RLE NWB precautions  Ambulation/Gait             General Gait Details: Unable to take complete hop on L foot, reliant on scooting L foot to pivot to recliner   Stairs             Wheelchair Mobility    Modified Rankin (Stroke Patients Only)       Balance Overall balance assessment: Needs assistance Sitting-balance support: No upper extremity supported Sitting balance-Leahy Scale: Good Sitting balance - Comments: pt able to sit EOB without assist   Standing balance support: Bilateral upper extremity supported Standing balance-Leahy Scale: Poor Standing balance comment: Reliant on BUE support                            Cognition Arousal/Alertness: Awake/alert Behavior During Therapy: WFL for tasks assessed/performed Overall Cognitive Status: Within Functional Limits for tasks assessed                                        Exercises General Exercises - Lower Extremity Long Arc Quad: AROM;Right;Seated Hip Flexion/Marching: AROM;Right;Seated    General Comments General comments (skin integrity, edema, etc.): Wife present and supportive. Increased time discussing d/c plan (home vs. SNF) -- pt ultimately deciding for CIR/SNF instead of return home based on  current functional mobility deficits and assist needs      Pertinent Vitals/Pain Pain Assessment: Faces Faces Pain Scale: Hurts little more Pain Location: R ankle Pain Descriptors / Indicators: Sore Pain Intervention(s): Monitored during session    Home Living                      Prior Function            PT Goals (current goals can now be found in the care plan section) Acute Rehab PT Goals Patient Stated Goal:  Agreeable for post-acute rehab to regain strength before return home PT Goal Formulation: With patient/family Time For Goal Achievement: 07/12/20 Potential to Achieve Goals: Good Progress towards PT goals: Progressing toward goals    Frequency    Min 3X/week      PT Plan Discharge plan needs to be updated    Co-evaluation              AM-PAC PT "6 Clicks" Mobility   Outcome Measure  Help needed turning from your back to your side while in a flat bed without using bedrails?: A Little Help needed moving from lying on your back to sitting on the side of a flat bed without using bedrails?: A Little Help needed moving to and from a bed to a chair (including a wheelchair)?: A Lot Help needed standing up from a chair using your arms (e.g., wheelchair or bedside chair)?: A Lot Help needed to walk in hospital room?: Total Help needed climbing 3-5 steps with a railing? : Total 6 Click Score: 12    End of Session Equipment Utilized During Treatment: Gait belt Activity Tolerance: Patient tolerated treatment well;Patient limited by fatigue;Patient limited by pain Patient left: in chair;with call bell/phone within reach;with family/visitor present Nurse Communication: Mobility status PT Visit Diagnosis: Other abnormalities of gait and mobility (R26.89);Difficulty in walking, not elsewhere classified (R26.2);Muscle weakness (generalized) (M62.81);Pain Pain - Right/Left: Right Pain - part of body: Ankle and joints of foot     Time: 1139-1207 PT Time Calculation (min) (ACUTE ONLY): 28 min  Charges:  $Therapeutic Activity: 8-22 mins $Self Care/Home Management: 8-22                     Mabeline Caras, PT, DPT Acute Rehabilitation Services  Pager [phone] Office [phone]  Keith [name] 06/30/2020, 1:18 PM

## 2020-06-30 NOTE — Progress Notes (Signed)
Rehab Admissions Coordinator Note:  Patient was screened by Cleatrice Burke for appropriateness for an Inpatient Acute Rehab Consult per therapy change in recs. .  At this time, we are recommending Inpatient Rehab consult. I will place order per protocol. Noted OR 9/24.  Cleatrice Burke RN MSN 06/30/2020, 1:25 PM  I can be reached at [phone].

## 2020-06-30 NOTE — Progress Notes (Signed)
Subjective:  Patient states pain is moderate this morning. He is complaining of muscle spasms in his right calf and foot. Otherwise no complaints.   Objective:  PE: VITALS:   Vitals:   06/30/20 0423 06/30/20 0531 06/30/20 0651 06/30/20 0736  BP: 114/76  (!) 164/91 111/84  Pulse:   88 95  Resp: 16  20 19   Temp: 99.2 F (37.3 C)  98.7 F (37.1 C) 98.5 F (36.9 C)  TempSrc: Oral  Oral Oral  SpO2: 96%  100% 99%  Weight:  116.6 kg    Height:       General: laying comfortably in bed, in no acute distress MSK: RLE - splint in place. Able to flex and extend all toes of right foot. Sensation intact throughout all toes. DP pulse found on doppler. No calf TTP, calf compressible.Anterior ankle skin assessed with no blistering, unable to assess at medial and lateral malleoli.    LABS  Results for orders placed or performed during the hospital encounter of 06/26/20 (from the past 24 hour(s))  Glucose, capillary     Status: Abnormal   Collection Time: 06/29/20 10:55 AM  Result Value Ref Range   Glucose-Capillary 142 (H) 70 - 99 mg/dL  Heparin level (unfractionated)     Status: Abnormal   Collection Time: 06/29/20  4:08 PM  Result Value Ref Range   Heparin Unfractionated 0.13 (L) 0.30 - 0.70 IU/mL  Glucose, capillary     Status: Abnormal   Collection Time: 06/29/20  4:32 PM  Result Value Ref Range   Glucose-Capillary 173 (H) 70 - 99 mg/dL  CBC     Status: Abnormal   Collection Time: 06/30/20  2:06 AM  Result Value Ref Range   WBC 7.0 4.0 - 10.5 K/uL   RBC 3.44 (L) 4.22 - 5.81 MIL/uL   Hemoglobin 10.4 (L) 13.0 - 17.0 g/dL   HCT 32.6 (L) 39 - 52 %   MCV 94.8 80.0 - 100.0 fL   MCH 30.2 26.0 - 34.0 pg   MCHC 31.9 30.0 - 36.0 g/dL   RDW 14.7 11.5 - 15.5 %   Platelets 337 150 - 400 K/uL   nRBC 0.3 (H) 0.0 - 0.2 %  Heparin level (unfractionated)     Status: None   Collection Time: 06/30/20  2:06 AM  Result Value Ref Range   Heparin Unfractionated 0.41 0.30 - 0.70 IU/mL    Glucose, capillary     Status: Abnormal   Collection Time: 06/30/20  7:30 AM  Result Value Ref Range   Glucose-Capillary 155 (H) 70 - 99 mg/dL    DG Ankle Complete Right  Result Date: 06/28/2020 CLINICAL DATA:  Right foot and ankle pain EXAM: RIGHT ANKLE - COMPLETE 3+ VIEW; RIGHT FOOT - 2 VIEW COMPARISON:  None. FINDINGS: Right ankle: Frontal, oblique, and lateral views demonstrate an oblique fracture of the distal right fibula, with mild displacement. Small ossific densities are seen within the medial aspect of the ankle mortise, consistent with avulsion fractures. Exact donor site is not identified, though likely from the medial malleolus. Coronally oriented minimally displaced fracture of the posterior malleolus is seen on the lateral view. There is dorsal subluxation of the talus relative to the tibial plafond. There is diffuse soft tissue edema. Right foot: Frontal and lateral views of the right foot demonstrate a small avulsion fracture off the dorsal distal margin of the talus. There is dorsal subluxation of the talus relative to the tibial plafond. No other  acute bony abnormalities. IMPRESSION: 1. Trimalleolar fracture as above. 2. Dorsal subluxation of the talus relative to the tibial plafond. 3. Small avulsion fracture dorsal distal margin of the talus. 4. Diffuse soft tissue edema. Electronically Signed   By: Randa Ngo M.D.   On: 06/28/2020 20:44   CT ANKLE RIGHT WO CONTRAST  Result Date: 06/29/2020 CLINICAL DATA:  Evaluate right ankle fracture. EXAM: CT OF THE RIGHT ANKLE WITHOUT CONTRAST TECHNIQUE: Multidetector CT imaging of the right ankle was performed according to the standard protocol. Multiplanar CT image reconstructions were also generated. COMPARISON:  Radiographs 06/28/2020 and 06/29/2020 FINDINGS: There is a mildly displaced oblique coursing fracture of the distal fibula at and above the level of the ankle mortise. Maximum displacement is 7 mm. Small avulsion type fractures  involving the distal tip of the medial malleolus. Complex comminuted intra-articular fracture involving the posterior malleolar region of the tibia. There is comminution at the articular surface posteriorly and maximum displacement of the articular component is 6.5 mm. Small fracture fragments are noted in the anterior tibiotalar joint space. The talus is intact. The subtalar joints are maintained. No calcaneal fracture. The visualized midfoot bony structures are intact. Grossly by CT the medial and lateral and anterior ankle tendons and ligaments are intact. The Achilles tendon is intact. Moderate subcutaneous soft tissue swelling/edema/fluid/hematoma. IMPRESSION: 1. Complex comminuted intra-articular fracture involving the posterior malleolar region of the tibia. There is comminution at the articular surface posteriorly and maximum displacement of the articular component is 6.5 mm. 2. Small avulsion type fractures involving the distal tip of the medial malleolus. 3. Mildly displaced oblique coursing fracture of the distal fibula at and above the level of the ankle mortise. Maximum displacement is 7 mm. Electronically Signed   By: Marijo Sanes M.D.   On: 06/29/2020 13:10   DG Ankle Right Port  Result Date: 06/29/2020 CLINICAL DATA:  Right ankle fracture reduction, EXAM: PORTABLE RIGHT ANKLE - 2 VIEW COMPARISON:  06/28/2020 FINDINGS: Two view radiograph of the right ankle spur formed within an external immobilizer which obscures fine bony detail. There has been interval reduction of tibiotalar subluxation. Medial joint space widening persists. Fracture fragments are in grossly anatomic alignment on this limited examination with persistent 2-3 mm posterior displacement and distraction of the distal fibular fracture fragment. IMPRESSION: Interval reduction of right tibiotalar subluxation Electronically Signed   By: Fidela Salisbury MD   On: 06/29/2020 00:46   DG Foot 2 Views Right  Result Date:  06/28/2020 CLINICAL DATA:  Right foot and ankle pain EXAM: RIGHT ANKLE - COMPLETE 3+ VIEW; RIGHT FOOT - 2 VIEW COMPARISON:  None. FINDINGS: Right ankle: Frontal, oblique, and lateral views demonstrate an oblique fracture of the distal right fibula, with mild displacement. Small ossific densities are seen within the medial aspect of the ankle mortise, consistent with avulsion fractures. Exact donor site is not identified, though likely from the medial malleolus. Coronally oriented minimally displaced fracture of the posterior malleolus is seen on the lateral view. There is dorsal subluxation of the talus relative to the tibial plafond. There is diffuse soft tissue edema. Right foot: Frontal and lateral views of the right foot demonstrate a small avulsion fracture off the dorsal distal margin of the talus. There is dorsal subluxation of the talus relative to the tibial plafond. No other acute bony abnormalities. IMPRESSION: 1. Trimalleolar fracture as above. 2. Dorsal subluxation of the talus relative to the tibial plafond. 3. Small avulsion fracture dorsal distal margin of the talus.  4. Diffuse soft tissue edema. Electronically Signed   By: Randa Ngo M.D.   On: 06/28/2020 20:44    Assessment/Plan: Right trimalleolar ankle fracture with dorsal dislocation: -s/p closed reduction 9/21, reduction confirmed on CT - plan for ORIF with Dr. Mardelle Matte tomorow - NWB RLE, can get up with assistance if able to  - continue oxycodone for pain - NPO after midnight - will hold heparin prior to surgery and restart after   Contact information:   Weekdays 8-5 Merlene Pulling, PA-C [phone] A fter hours and holidays please check [email or web address] for group call information for [facility]  Ventura Bruns 06/30/2020, 10:04 AM

## 2020-07-01 ENCOUNTER — Inpatient Hospital Stay (HOSPITAL_COMMUNITY): Payer: Medicare Other

## 2020-07-01 ENCOUNTER — Encounter (HOSPITAL_COMMUNITY): Payer: Self-pay | Admitting: Pulmonary Disease

## 2020-07-01 ENCOUNTER — Encounter (HOSPITAL_COMMUNITY)
Admission: EM | Disposition: A | Discharge status: Rehabilitation Facility | Payer: Self-pay | Source: Home / Self Care | Attending: Internal Medicine

## 2020-07-01 ENCOUNTER — Inpatient Hospital Stay (HOSPITAL_COMMUNITY): Payer: Medicare Other | Admitting: Anesthesiology

## 2020-07-01 HISTORY — PX: ORIF ANKLE FRACTURE: SHX5408

## 2020-07-01 LAB — GLUCOSE, CAPILLARY
Glucose-Capillary: 142 mg/dL — ABNORMAL HIGH (ref 70–99)
Glucose-Capillary: 118 mg/dL — ABNORMAL HIGH (ref 70–99)
Glucose-Capillary: 161 mg/dL — ABNORMAL HIGH (ref 70–99)
Glucose-Capillary: 190 mg/dL — ABNORMAL HIGH (ref 70–99)
Glucose-Capillary: 252 mg/dL — ABNORMAL HIGH (ref 70–99)

## 2020-07-01 LAB — COMPREHENSIVE METABOLIC PANEL
ALT: 53 U/L — ABNORMAL HIGH (ref 0–44)
AST: 39 U/L (ref 15–41)
Albumin: 3.4 g/dL — ABNORMAL LOW (ref 3.5–5.0)
Alkaline Phosphatase: 87 U/L (ref 38–126)
Anion gap: 11 (ref 5–15)
BUN: 13 mg/dL (ref 8–23)
CO2: 23 mmol/L (ref 22–32)
Calcium: 9.6 mg/dL (ref 8.9–10.3)
Chloride: 105 mmol/L (ref 98–111)
Creatinine, Ser: 1.16 mg/dL (ref 0.61–1.24)
GFR calc Af Amer: >60 mL/min (ref >60)
GFR calc non Af Amer: >60 mL/min (ref >60)
Glucose, Bld: 263 mg/dL — ABNORMAL HIGH (ref 70–99)
Potassium: 3.1 mmol/L — ABNORMAL LOW (ref 3.5–5.1)
Sodium: 139 mmol/L (ref 135–145)
Total Bilirubin: 0.9 mg/dL (ref 0.3–1.2)
Total Protein: 7.4 g/dL (ref 6.5–8.1)

## 2020-07-01 LAB — CBC
HCT: 31.5 % — ABNORMAL LOW (ref 39.0–52.0)
Hemoglobin: 10.4 g/dL — ABNORMAL LOW (ref 13.0–17.0)
MCH: 31.4 pg (ref 26.0–34.0)
MCHC: 33 g/dL (ref 30.0–36.0)
MCV: 95.2 fL (ref 80.0–100.0)
Platelets: 373 10*3/uL (ref 150–400)
RBC: 3.31 MIL/uL — ABNORMAL LOW (ref 4.22–5.81)
RDW: 14.8 % (ref 11.5–15.5)
WBC: 6.1 10*3/uL (ref 4.0–10.5)
nRBC: 0.3 % — ABNORMAL HIGH (ref 0.0–0.2)

## 2020-07-01 SURGERY — OPEN REDUCTION INTERNAL FIXATION (ORIF) ANKLE FRACTURE
Anesthesia: General | Site: Ankle | Laterality: Right

## 2020-07-01 MED ORDER — ACETAMINOPHEN 325 MG PO TABS: 325.0000 mg | ORAL_TABLET | ORAL | Status: DC | PRN

## 2020-07-01 MED ORDER — ONDANSETRON HCL 4 MG/2ML IJ SOLN: 4.0000 mg | Freq: Once | INTRAMUSCULAR | Status: DC | PRN

## 2020-07-01 MED ORDER — LIDOCAINE 2% (20 MG/ML) 5 ML SYRINGE
INTRAMUSCULAR | Status: AC
  Filled 2020-07-01: qty 5

## 2020-07-01 MED ORDER — KETOROLAC TROMETHAMINE 15 MG/ML IJ SOLN: 15.0000 mg | Freq: Once | INTRAMUSCULAR | Status: DC

## 2020-07-01 MED ORDER — MAGNESIUM CITRATE PO SOLN: 1.0000 | Freq: Once | ORAL | Status: DC | PRN

## 2020-07-01 MED ORDER — PROPOFOL 10 MG/ML IV BOLUS
INTRAVENOUS | Status: AC
  Filled 2020-07-01: qty 20

## 2020-07-01 MED ORDER — DEXAMETHASONE SODIUM PHOSPHATE 10 MG/ML IJ SOLN
INTRAMUSCULAR | Status: DC | PRN
  Administered 2020-07-01 (×2): 10 mg

## 2020-07-01 MED ORDER — FENTANYL CITRATE (PF) 100 MCG/2ML IJ SOLN
INTRAMUSCULAR | Status: AC
  Administered 2020-07-01: 100 ug
  Filled 2020-07-01: qty 2

## 2020-07-01 MED ORDER — FENTANYL CITRATE (PF) 250 MCG/5ML IJ SOLN
INTRAMUSCULAR | Status: AC
  Filled 2020-07-01: qty 5

## 2020-07-01 MED ORDER — BUPIVACAINE-EPINEPHRINE (PF) 0.5% -1:200000 IJ SOLN
INTRAMUSCULAR | Status: DC | PRN
  Administered 2020-07-01 (×6): 5 mL via PERINEURAL

## 2020-07-01 MED ORDER — ROPIVACAINE HCL 7.5 MG/ML IJ SOLN
INTRAMUSCULAR | Status: DC | PRN
  Administered 2020-07-01 (×4): 5 mL via PERINEURAL

## 2020-07-01 MED ORDER — ONDANSETRON HCL 4 MG/2ML IJ SOLN
INTRAMUSCULAR | Status: DC | PRN
  Administered 2020-07-01: 4 mg via INTRAVENOUS

## 2020-07-01 MED ORDER — METOCLOPRAMIDE HCL 5 MG PO TABS: 5.0000 mg | ORAL_TABLET | Freq: Three times a day (TID) | ORAL | Status: DC | PRN

## 2020-07-01 MED ORDER — PHENYLEPHRINE 40 MCG/ML (10ML) SYRINGE FOR IV PUSH (FOR BLOOD PRESSURE SUPPORT)
PREFILLED_SYRINGE | INTRAVENOUS | Status: DC | PRN
  Administered 2020-07-01 (×5): 80 ug via INTRAVENOUS

## 2020-07-01 MED ORDER — PROPOFOL 10 MG/ML IV BOLUS
INTRAVENOUS | Status: DC | PRN
  Administered 2020-07-01: 150 mg via INTRAVENOUS

## 2020-07-01 MED ORDER — CEFAZOLIN SODIUM-DEXTROSE 2-4 GM/100ML-% IV SOLN
2.0000 g | Freq: Four times a day (QID) | INTRAVENOUS | Status: AC
  Administered 2020-07-01 – 2020-07-02 (×3): 2 g via INTRAVENOUS
  Filled 2020-07-01 (×3): qty 100

## 2020-07-01 MED ORDER — METOCLOPRAMIDE HCL 5 MG/ML IJ SOLN: 5.0000 mg | Freq: Three times a day (TID) | INTRAMUSCULAR | Status: DC | PRN

## 2020-07-01 MED ORDER — LIDOCAINE 2% (20 MG/ML) 5 ML SYRINGE
INTRAMUSCULAR | Status: DC | PRN
  Administered 2020-07-01: 100 mg via INTRAVENOUS

## 2020-07-01 MED ORDER — FENTANYL CITRATE (PF) 100 MCG/2ML IJ SOLN: 25.0000 ug | INTRAMUSCULAR | Status: DC | PRN

## 2020-07-01 MED ORDER — OXYCODONE HCL 5 MG/5ML PO SOLN: 5.0000 mg | Freq: Once | ORAL | Status: DC | PRN

## 2020-07-01 MED ORDER — MIDAZOLAM HCL 2 MG/2ML IJ SOLN
INTRAMUSCULAR | Status: AC
  Administered 2020-07-01: 2 mg via INTRAVENOUS
  Filled 2020-07-01: qty 2

## 2020-07-01 MED ORDER — CHLORHEXIDINE GLUCONATE 0.12 % MT SOLN
OROMUCOSAL | Status: AC
  Administered 2020-07-01: 15 mL
  Filled 2020-07-01: qty 15

## 2020-07-01 MED ORDER — ONDANSETRON HCL 4 MG/2ML IJ SOLN: 4.0000 mg | Freq: Four times a day (QID) | INTRAMUSCULAR | Status: DC | PRN

## 2020-07-01 MED ORDER — HEPARIN (PORCINE) 25000 UT/250ML-% IV SOLN
1800.0000 [IU]/h | INTRAVENOUS | Status: DC
  Administered 2020-07-01 – 2020-07-02 (×2): 1800 [IU]/h via INTRAVENOUS
  Filled 2020-07-01 (×2): qty 250

## 2020-07-01 MED ORDER — ONDANSETRON HCL 4 MG/2ML IJ SOLN
INTRAMUSCULAR | Status: AC
  Filled 2020-07-01: qty 2

## 2020-07-01 MED ORDER — LACTATED RINGERS IV SOLN: INTRAVENOUS | Status: DC | PRN

## 2020-07-01 MED ORDER — ACETAMINOPHEN 10 MG/ML IV SOLN
INTRAVENOUS | Status: DC | PRN
  Administered 2020-07-01: 1000 mg via INTRAVENOUS

## 2020-07-01 MED ORDER — 0.9 % SODIUM CHLORIDE (POUR BTL) OPTIME
TOPICAL | Status: DC | PRN
  Administered 2020-07-01: 1000 mL

## 2020-07-01 MED ORDER — MIDAZOLAM HCL 2 MG/2ML IJ SOLN
INTRAMUSCULAR | Status: AC
  Filled 2020-07-01: qty 2

## 2020-07-01 MED ORDER — FENTANYL CITRATE (PF) 250 MCG/5ML IJ SOLN
INTRAMUSCULAR | Status: DC | PRN
Start: 2020-07-01 — End: 2020-07-01
  Administered 2020-07-01: 100 ug via INTRAVENOUS

## 2020-07-01 MED ORDER — MIDAZOLAM HCL 2 MG/2ML IJ SOLN: 2.0000 mg | Freq: Once | INTRAMUSCULAR | Status: AC

## 2020-07-01 MED ORDER — ONDANSETRON HCL 4 MG PO TABS: 4.0000 mg | ORAL_TABLET | Freq: Four times a day (QID) | ORAL | Status: DC | PRN

## 2020-07-01 MED ORDER — DIPHENHYDRAMINE HCL 12.5 MG/5ML PO ELIX: 12.5000 mg | ORAL_SOLUTION | ORAL | Status: DC | PRN

## 2020-07-01 MED ORDER — ACETAMINOPHEN 10 MG/ML IV SOLN
INTRAVENOUS | Status: AC
  Filled 2020-07-01: qty 100

## 2020-07-01 MED ORDER — MIDAZOLAM HCL 5 MG/5ML IJ SOLN
INTRAMUSCULAR | Status: DC | PRN
  Administered 2020-07-01: 1 mg via INTRAVENOUS

## 2020-07-01 MED ORDER — FENTANYL CITRATE (PF) 100 MCG/2ML IJ SOLN: 100.0000 ug | Freq: Once | INTRAMUSCULAR | Status: DC

## 2020-07-01 MED ORDER — MEPERIDINE HCL 25 MG/ML IJ SOLN: 6.2500 mg | INTRAMUSCULAR | Status: DC | PRN

## 2020-07-01 MED ORDER — OXYCODONE HCL 5 MG PO TABS: 5.0000 mg | ORAL_TABLET | Freq: Once | ORAL | Status: DC | PRN

## 2020-07-01 MED ORDER — POTASSIUM CHLORIDE IN NACL 20-0.45 MEQ/L-% IV SOLN
INTRAVENOUS | Status: DC
  Filled 2020-07-01 (×4): qty 1000

## 2020-07-01 MED ORDER — CLONIDINE HCL (ANALGESIA) 100 MCG/ML EP SOLN
EPIDURAL | Status: DC | PRN
  Administered 2020-07-01 (×2): 100 ug

## 2020-07-01 MED ORDER — ACETAMINOPHEN 160 MG/5ML PO SOLN: 325.0000 mg | ORAL | Status: DC | PRN

## 2020-07-01 MED ORDER — BISACODYL 10 MG RE SUPP: 10.0000 mg | Freq: Every day | RECTAL | Status: DC | PRN

## 2020-07-01 SURGICAL SUPPLY — 74 items
BIT DRILL 110X2.5XQCK CNCT (BIT) ×1 IMPLANT
BIT DRILL 2.5 (BIT) ×3
BIT DRL 110X2.5XQCK CNCT (BIT) ×1
BNDG CMPR 9X4 STRL LF SNTH (GAUZE/BANDAGES/DRESSINGS) ×1
BNDG CMPR MED 10X6 ELC LF (GAUZE/BANDAGES/DRESSINGS) ×1
BNDG COHESIVE 4X5 TAN STRL (GAUZE/BANDAGES/DRESSINGS) ×3 IMPLANT
BNDG ELASTIC 4X5.8 VLCR STR LF (GAUZE/BANDAGES/DRESSINGS) ×3 IMPLANT
BNDG ELASTIC 6X10 VLCR STRL LF (GAUZE/BANDAGES/DRESSINGS) ×3 IMPLANT
BNDG ELASTIC 6X5.8 VLCR STR LF (GAUZE/BANDAGES/DRESSINGS) ×3 IMPLANT
BNDG ESMARK 4X9 LF (GAUZE/BANDAGES/DRESSINGS) ×3 IMPLANT
BOOTCOVER CLEANROOM LRG (PROTECTIVE WEAR) IMPLANT
CANISTER SUCT 3000ML PPV (MISCELLANEOUS) ×3 IMPLANT
CLOSURE STERI-STRIP 1/2X4 (GAUZE/BANDAGES/DRESSINGS)
CLSR STERI-STRIP ANTIMIC 1/2X4 (GAUZE/BANDAGES/DRESSINGS) IMPLANT
COVER SURGICAL LIGHT HANDLE (MISCELLANEOUS) ×3 IMPLANT
COVER WAND RF STERILE (DRAPES) IMPLANT
CUFF TOURN SGL QUICK 34 (TOURNIQUET CUFF)
CUFF TOURN SGL QUICK 42 (TOURNIQUET CUFF) IMPLANT
CUFF TRNQT CYL 34X4.125X (TOURNIQUET CUFF) IMPLANT
DECANTER SPIKE VIAL GLASS SM (MISCELLANEOUS) IMPLANT
DRAPE C-ARM 42X72 X-RAY (DRAPES) IMPLANT
DRAPE C-ARMOR (DRAPES) IMPLANT
DRAPE INCISE IOBAN 66X45 STRL (DRAPES) IMPLANT
DRAPE OEC MINIVIEW 54X84 (DRAPES) ×3 IMPLANT
DRAPE U-SHAPE 47X51 STRL (DRAPES) ×3 IMPLANT
DRIVER HEX 2.5 (ORTHOPEDIC DISPOSABLE SUPPLIES) ×3 IMPLANT
DRSG PAD ABDOMINAL 8X10 ST (GAUZE/BANDAGES/DRESSINGS) ×6 IMPLANT
DRSG XEROFORM 1X8 (GAUZE/BANDAGES/DRESSINGS) ×3 IMPLANT
DURAPREP 26ML APPLICATOR (WOUND CARE) ×6 IMPLANT
ELECT REM PT RETURN 9FT ADLT (ELECTROSURGICAL) ×3
ELECTRODE REM PT RTRN 9FT ADLT (ELECTROSURGICAL) ×1 IMPLANT
GAUZE SPONGE 4X4 12PLY STRL (GAUZE/BANDAGES/DRESSINGS) ×3 IMPLANT
GAUZE XEROFORM 1X8 LF (GAUZE/BANDAGES/DRESSINGS) ×3 IMPLANT
GLOVE BIO SURGEON STRL SZ7 (GLOVE) ×6 IMPLANT
GLOVE BIOGEL PI IND STRL 7.0 (GLOVE) ×1 IMPLANT
GLOVE BIOGEL PI INDICATOR 7.0 (GLOVE) ×2
GLOVE ORTHO TXT STRL SZ7.5 (GLOVE) ×3 IMPLANT
GLOVE SURG ORTHO 8.0 STRL STRW (GLOVE) ×3 IMPLANT
GOWN STRL REUS W/ TWL LRG LVL3 (GOWN DISPOSABLE) ×1 IMPLANT
GOWN STRL REUS W/ TWL XL LVL3 (GOWN DISPOSABLE) ×2 IMPLANT
GOWN STRL REUS W/TWL 2XL LVL3 (GOWN DISPOSABLE) ×3 IMPLANT
GOWN STRL REUS W/TWL LRG LVL3 (GOWN DISPOSABLE) ×3
GOWN STRL REUS W/TWL XL LVL3 (GOWN DISPOSABLE) ×6
KIT BASIN OR (CUSTOM PROCEDURE TRAY) ×3 IMPLANT
KIT TURNOVER KIT B (KITS) ×3 IMPLANT
NS IRRIG 1000ML POUR BTL (IV SOLUTION) ×3 IMPLANT
PACK ORTHO EXTREMITY (CUSTOM PROCEDURE TRAY) ×3 IMPLANT
PAD ABD 8X10 STRL (GAUZE/BANDAGES/DRESSINGS) ×3 IMPLANT
PAD ARMBOARD 7.5X6 YLW CONV (MISCELLANEOUS) ×6 IMPLANT
PAD CAST 4YDX4 CTTN HI CHSV (CAST SUPPLIES) ×1 IMPLANT
PADDING CAST COTTON 4X4 STRL (CAST SUPPLIES) ×3
PLATE 6HOLE 1/3 TUBULAR (Plate) ×3 IMPLANT
SCREW CANC 2.5XFT 16X4XST SM (Screw) ×2 IMPLANT
SCREW CANC 4.0X16 (Screw) ×6 IMPLANT
SCREW CORT 2.5X20X3.5XST SM (Screw) ×2 IMPLANT
SCREW CORTICAL 3.5X14 (Screw) ×9 IMPLANT
SCREW CORTICAL 3.5X20 (Screw) ×6 IMPLANT
SPLINT PLASTER CAST XFAST 5X30 (CAST SUPPLIES) ×1 IMPLANT
SPLINT PLASTER XFAST SET 5X30 (CAST SUPPLIES) ×2
SPONGE LAP 4X18 RFD (DISPOSABLE) ×3 IMPLANT
SUCTION FRAZIER HANDLE 10FR (MISCELLANEOUS) ×3
SUCTION TUBE FRAZIER 10FR DISP (MISCELLANEOUS) ×1 IMPLANT
SUT MNCRL AB 3-0 PS2 18 (SUTURE) IMPLANT
SUT VIC AB 0 CT1 27 (SUTURE)
SUT VIC AB 0 CT1 27XBRD ANBCTR (SUTURE) IMPLANT
SUT VIC AB 3-0 SH 8-18 (SUTURE) IMPLANT
SYR CONTROL 10ML LL (SYRINGE) IMPLANT
TOWEL GREEN STERILE (TOWEL DISPOSABLE) ×3 IMPLANT
TOWEL GREEN STERILE FF (TOWEL DISPOSABLE) ×3 IMPLANT
TUBE CONNECTING 12'X1/4 (SUCTIONS) ×1
TUBE CONNECTING 12X1/4 (SUCTIONS) ×2 IMPLANT
UNDERPAD 30X36 HEAVY ABSORB (UNDERPADS AND DIAPERS) ×3 IMPLANT
WATER STERILE IRR 1000ML POUR (IV SOLUTION) IMPLANT
YANKAUER SUCT BULB TIP NO VENT (SUCTIONS) ×3 IMPLANT

## 2020-07-01 NOTE — Anesthesia Procedure Notes (Signed)
Anesthesia Regional Block: Adductor canal block   Pre-Anesthetic Checklist: ,, timeout performed, Correct Patient, Correct Site, Correct Laterality, Correct Procedure, Correct Position, site marked, Risks and benefits discussed,  Surgical consent,  Pre-op evaluation,  At surgeon's request and post-op pain management  Laterality: Lower and Right  Prep: chloraprep       Needles:  Injection technique: Single-shot  Needle Type: Echogenic Stimulator Needle     Needle Length: 9cm  Needle Gauge: 20   Needle insertion depth: 3 cm   Additional Needles:   Procedures:,,,, ultrasound used (permanent image in chart),,,,  Narrative:  Start time: 07/01/2020 12:00 PM End time: 07/01/2020 12:10 PM Injection made incrementally with aspirations every 5 mL.  Performed by: Personally  Anesthesiologist: Lyn Hollingshead, MD

## 2020-07-01 NOTE — Transfer of Care (Signed)
Immediate Anesthesia Transfer of Care Note  Patient: Keith [name]  Procedure(s) Performed: OPEN REDUCTION INTERNAL FIXATION (ORIF) ANKLE FRACTURE. TRIMALLEOLAR (Right Ankle)  Patient Location: PACU  Anesthesia Type:General  Level of Consciousness: drowsy and patient cooperative  Airway & Oxygen Therapy: Patient Spontanous Breathing and Patient connected to nasal cannula oxygen  Post-op Assessment: Report given to RN and Post -op Vital signs reviewed and stable  Post vital signs: Reviewed and stable  Last Vitals:  Vitals Value Taken Time  BP 126/95 07/01/20 1423  Temp 36.7 C 07/01/20 1423  Pulse 98 07/01/20 1423  Resp 14 07/01/20 1423  SpO2 100 % 07/01/20 1423  Vitals shown include unvalidated device data.  Last Pain:  Vitals:   07/01/20 1423  TempSrc:   PainSc: (P) Asleep      Patients Stated Pain Goal: 0 ([id number])  Complications: No complications documented.

## 2020-07-01 NOTE — Anesthesia Procedure Notes (Signed)
Anesthesia Regional Block: Popliteal block   Pre-Anesthetic Checklist: ,, timeout performed, Correct Patient, Correct Site, Correct Laterality, Correct Procedure, Correct Position, site marked, Risks and benefits discussed,  Surgical consent,  Pre-op evaluation,  At surgeon's request and post-op pain management  Laterality: Lower and Right  Prep: chloraprep       Needles:  Injection technique: Single-shot  Needle Type: Echogenic Stimulator Needle     Needle Length: 9cm  Needle Gauge: 20   Needle insertion depth: 1.5 cm   Additional Needles:   Procedures:,,,, ultrasound used (permanent image in chart),,,,  Narrative:  Start time: 07/01/2020 12:15 PM End time: 07/01/2020 12:25 PM Injection made incrementally with aspirations every 5 mL.  Performed by: Personally  Anesthesiologist: Lyn Hollingshead, MD

## 2020-07-01 NOTE — Anesthesia Preprocedure Evaluation (Signed)
Anesthesia Evaluation  Patient identified by MRN, date of birth, ID band Patient awake    Reviewed: Allergy & Precautions, NPO status , Patient's Chart, lab work & pertinent test results, reviewed documented beta blocker date and time   History of Anesthesia Complications Negative for: history of anesthetic complications  Airway Mallampati: I  TM Distance: >3 FB Neck ROM: Full    Dental  (+) Teeth Intact   Pulmonary former smoker,    breath sounds clear to auscultation       Cardiovascular hypertension, Pt. on medications Normal cardiovascular exam Rhythm:Regular     Neuro/Psych negative neurological ROS  negative psych ROS   GI/Hepatic   Endo/Other  diabetes, Type 2, Oral Hypoglycemic AgentsMorbid obesity  Renal/GU   negative genitourinary   Musculoskeletal  (+) Arthritis , Osteoarthritis,    Abdominal (+) + obese,   Peds  Hematology  (+) Blood dyscrasia, anemia ,   Anesthesia Other Findings   Reproductive/Obstetrics                             Anesthesia Physical  Anesthesia Plan  ASA: II  Anesthesia Plan: General   Post-op Pain Management:  Regional for Post-op pain   Induction: Intravenous  PONV Risk Score and Plan: 2 and Ondansetron, Midazolam and Treatment may vary due to age or medical condition  Airway Management Planned: LMA  Additional Equipment: None  Intra-op Plan:   Post-operative Plan:   Informed Consent: I have reviewed the patients History and Physical, chart, labs and discussed the procedure including the risks, benefits and alternatives for the proposed anesthesia with the patient or authorized representative who has indicated his/her understanding and acceptance.     Dental advisory given  Plan Discussed with: CRNA  Anesthesia Plan Comments:         Anesthesia Quick Evaluation

## 2020-07-01 NOTE — Anesthesia Procedure Notes (Signed)
Procedure Name: LMA Insertion Date/Time: 07/01/2020 1:03 PM Performed by: Michele Rockers, CRNA Pre-anesthesia Checklist: Patient identified, Emergency Drugs available, Suction available and Patient being monitored Patient Re-evaluated:Patient Re-evaluated prior to induction Oxygen Delivery Method: Circle system utilized Preoxygenation: Pre-oxygenation with 100% oxygen Induction Type: IV induction Ventilation: Mask ventilation without difficulty LMA Size: 5.0 Number of attempts: 1 Airway Equipment and Method: Oral airway Placement Confirmation: positive ETCO2 and breath sounds checked- equal and bilateral Tube secured with: Tape Dental Injury: Teeth and Oropharynx as per pre-operative assessment

## 2020-07-01 NOTE — Op Note (Signed)
06/26/2020 - 07/01/2020  PATIENT:  Keith [name]    PRE-OPERATIVE DIAGNOSIS: Right bimalleolar ankle fracture, distal fibula and posterior malleolus  POST-OPERATIVE DIAGNOSIS:  Same  PROCEDURE:    1.  Open Reduction Internal Fixation bimalleolar ankle fracture, with fixation of the fibula without fixation of the posterior malleolus 2.  3 views plus a stress view of the right ankle  SURGEON:  Johnny Bridge, MD  PHYSICIAN ASSISTANT: Merlene Pulling, PA-C, present and scrubbed throughout the case, critical for completion in a timely fashion, and for retraction, instrumentation, and closure.  ANESTHESIA:   General  ESTIMATED BLOOD LOSS: Minimal  UNIQUE ASPECTS OF THE CASE: Bone quality was mediocre.  I was able to mobilize the fracture, reduce it anatomically, restore length, I used a positional screw rather than a lag screw because I did not trust the bone quality.  The syndesmosis was stable, the posterior malleolus was small, and not amenable to fixation.  PREOPERATIVE INDICATIONS:  [name] is a  69 y.o. male who fell during his hospitalization for pulmonary embolus, and had a right ankle fracture subluxation, which underwent urgent closed reduction in the middle of the night the other night, and then elected for definitive surgical management once he was medically optimized, in order to minimize the risk for malunion and nonunion and post-traumatic arthritis.    The risks benefits and alternatives were discussed with the patient preoperatively including but not limited to the risks of infection, bleeding, nerve injury, cardiopulmonary complications, the need for revision surgery, the need for hardware removal, among others, and the patient was willing to proceed.  OPERATIVE IMPLANTS: Biomet / Zimmer 1/3 tubular plate size 6 hole with 2 distal cancellous screws that had actually reasonably good purchase, and 3 proximal cortical screws, with a single interfragmentary positional  screw.  OPERATIVE PROCEDURE: The patient was brought to the operating room and placed in the supine position. All bony prominences were padded. General anesthesia was administered. The lower extremity was prepped and draped in the usual sterile fashion. The leg was elevated and exsanguinated and the tourniquet was inflated. Time out was performed.   Incision was made over the distal fibula and the fracture was exposed and reduced anatomically with a clamp. A positional screw was placed. I then applied a 1/3 tubular plate and secured it proximally and distally non-locking screws. Bone quality was mediocre. I used c-arm to confirm satisfactory reduction and fixation.   The syndesmosis was stressed using live fluoroscopy and found to be stable.   The wounds were irrigated, and closed with vicryl with routine closure for the skin. The wounds were injected with local anesthetic. Sterile gauze was applied followed by a posterior splint. He was awakened and returned to the PACU in stable and satisfactory condition. There were no complications.  3-Views plus a stress view of the ankle demonstrated appropriate reconstruction of the mortise with internal fixation and appropriate stability of the syndesmosis.

## 2020-07-01 NOTE — Progress Notes (Signed)
[place] for heparin Indication: pulmonary embolus  HEPARIN DW (KG): 104.2   Assessment: 12 YOM who presents with massive PE and saddle embolus and associated R heart strain. Patient to receive systemic thrombolysis with IV tPA. Pharmacy consulted to start IV heparin post tPA (tPA finished ~0100 9/20). He is noted with an ankle fracture and plans noted for OR on 9/24.   Patient is s/p ORIF ankle fracture - pharmacy consulted to resume IV heparin. AET 14:24. Previously therapeutic on 1800 units/hr.  Goal of Therapy:  Heparin level = 0.3 - 0.7 units/mL  Monitor platelets by anticoagulation protocol: Yes  Plan -Resume heparin infusion at 1800 units/hr with no bolus -6 hr heparin level -Heparin level  daily with CBC daily -Monitor for s/sx of bleeding   Thank you for involving pharmacy in this patient's care.  Renold Genta, PharmD, BCPS Clinical Pharmacist Clinical phone for 07/01/2020 until 9:30p is [phone] 07/01/2020 5:20 PM  **Pharmacist phone directory can be found on [email or web address] listed under [place]**

## 2020-07-01 NOTE — Anesthesia Postprocedure Evaluation (Signed)
Anesthesia Post Note  Patient: Keith [name]  Procedure(s) Performed: OPEN REDUCTION INTERNAL FIXATION (ORIF) ANKLE FRACTURE. TRIMALLEOLAR (Right Ankle)     Patient location during evaluation: PACU Anesthesia Type: General Level of consciousness: awake Pain management: pain level controlled Vital Signs Assessment: post-procedure vital signs reviewed and stable Respiratory status: spontaneous breathing Cardiovascular status: stable Postop Assessment: no apparent nausea or vomiting Anesthetic complications: no   No complications documented.  Last Vitals:  Vitals:   07/01/20 1438 07/01/20 1455  BP: 120/82 127/66  Pulse: 89 89  Resp: 16 16  Temp:    SpO2: 97% 97%    Last Pain:  Vitals:   07/01/20 1455  TempSrc:   PainSc: 0-No pain                 Huston Foley

## 2020-07-01 NOTE — Progress Notes (Signed)
Patient seen and examined.  Plan for ORIF RLE. High risk given recent PE and anticoagulation.    The risks benefits and alternatives were discussed with the patient including but not limited to the risks of nonoperative treatment, versus surgical intervention including infection, bleeding, nerve injury, malunion, nonunion, the need for revision surgery, hardware prominence, hardware failure, the need for hardware removal, blood clots, cardiopulmonary complications, morbidity, mortality, among others, and they were willing to proceed.    Johnny Bridge, MD

## 2020-07-01 NOTE — Plan of Care (Signed)
  Problem: Health Behavior/Discharge Planning: Goal: Ability to manage health-related needs will improve Outcome: Progressing   Problem: Clinical Measurements: Goal: Ability to maintain clinical measurements within normal limits will improve Outcome: Progressing   Problem: Coping: Goal: Level of anxiety will decrease Outcome: Progressing   Problem: Elimination: Goal: Will not experience complications related to bowel motility Outcome: Progressing   Problem: Pain Managment: Goal: General experience of comfort will improve Outcome: Progressing   Problem: Safety: Goal: Ability to remain free from injury will improve Outcome: Progressing   Problem: Skin Integrity: Goal: Risk for impaired skin integrity will decrease Outcome: Progressing

## 2020-07-01 NOTE — Progress Notes (Signed)
Spoke with nurse regarding POC and PIV placement. Instructed on vein preservation and compatibility. Instructed to notify [facility] with any other needs. RN VU. Fran Lowes, RN [facility]

## 2020-07-01 NOTE — Progress Notes (Signed)
PROGRESS NOTE    HOLMAN BONSIGNORE  GOT:[id number] DOB: 21-May-1951 DOA: 06/26/2020 PCP: [name], [name], MD   Brief Narrative:  Pt presented with near syncope after returning from the bathroom.  Also complained of chest pain like "elephant sitting on his chest" noted to be hypoxic with saturation in the 80s, blood pressure in the 50s, brought in by EMS as a code STEMI .  Placed on nonrebreather, initial blood pressure 70s, started on Levophed, central line placed in ED, CT angiogram obtained which confirmed saddle PE. Recent admission 3 weeks ago for acute on chronic back pain he is on chronic opiates for back pain for at least 2 years by neurosurgery.  He reports since his admission he has mostly been sitting around, limited movement.   Patient admitted as above with atypical chest pain hypoxia found to have massive saddle PE as well as left lower extremity DVT likely provoked in the setting of poor ambulatory status and prolonged sedentary lifestyle.  Patient tolerated TPA quite well, now on heparin drip likely transition over to p.o. anticoagulation the next 24 to 48 hours pending clinical course was post operatively.   Assessment & Plan:   Active Problems:   AKI (acute kidney injury) ([place])   Pulmonary embolism (HCC)   Acute respiratory failure with hypoxia (HCC)   Acute massive/saddle PE with hemodynamic compromise and near syncope, POA - Status post TPA followed by IV heparin  - 2D echo - without overt findings as below - Left lower extremity DVT noted on ultrasound - BP improving after lytics - continue holding home meds -Continue IV heparin, likely transition to p.o. anticoagulation with DOAC postoperatively once cleared with orthopedic surgery  Acute hypoxic respiratory failure -related to above, titrate oxygen as needed resolved - Ambulatory oxygen screen daily, wean as tolerated -Currently on room air  Ambulatory dysfunction, acute mechanical fall  Right trimalleolar ankle  fracture with dorsal dislocation - Orthopedics following, reduced 06/28/2020 -ORIF planned later this morning 07/01/2020 - Patient has known chronic ambulatory dysfunction, will continue PT/OT with close monitoring given high fall risk -currently being evaluated for discharge to CIR  AKI, resolved - Related to above, continue to advance diet, increase p.o. intake as tolerated Lab Results  Component Value Date   CREATININE 1.16 07/01/2020   CREATININE 1.20 06/29/2020   CREATININE 1.32 (H) 06/28/2020   Chronic opiate use - Resume oxycodone - Holding OxyContin for now - May require short acting narcotics postoperatively  Uncontrolled diabetes -likely related to steroids that he recently received - Start Lantus 10 units - SSI resistant scale - A1C 6.7  DVT prophylaxis: Heparin drip as above (held perioperatively this morning per protocol) Code Status: Full Family Communication: Wife at bedside  Status is: Inpatient  Dispo: The patient is from: Home              Anticipated d/c is to: Likely CIR versus SNF pending ambulatory status postoperatively              Anticipated d/c date is: 24 to 48 hours pending clinical course              Patient currently not medically stable for discharge given ongoing need for IV anticoagulation in the setting of saddle PE, surgical procedure planned later today, disposition pending advancement of anticoagulation to p.o. as well as safe discharge location and ambulatory status.  Consultants:   PCCM, orthopedic surgery  Procedures:   TPA administration as above  Antimicrobials:  None  Subjective: No acute issues or events overnight, pain currently well controlled, denies nausea, vomiting, diarrhea, constipation, headache, fevers, chills.  Objective: Vitals:   07/01/20 0619 07/01/20 0820 07/01/20 1109 07/01/20 1212  BP:  113/74 116/79 130/62  Pulse:  100 99   Resp: 20 19 20    Temp:  98.5 F (36.9 C) 98.8 F (37.1 C)   TempSrc: Oral  Oral Oral   SpO2:  95% 95%   Weight:      Height:        Intake/Output Summary (Last 24 hours) at 07/01/2020 1312 Last data filed at 07/01/2020 1305 Gross per 24 hour  Intake 100 ml  Output 1025 ml  Net -925 ml   Filed Weights   06/29/20 0616 06/30/20 0531 07/01/20 0351  Weight: 120.4 kg 116.6 kg 116.6 kg    Examination:  General:  Pleasantly resting in bed, No acute distress. HEENT:  Normocephalic atraumatic.  Sclerae nonicteric, noninjected.  Extraocular movements intact bilaterally. Neck:  Without mass or deformity.  Trachea is midline. Lungs:  Clear to auscultate bilaterally without rhonchi, wheeze, or rales. Heart:  Regular rate and rhythm.  Without murmurs, rubs, or gallops. Abdomen:  Soft, nontender, nondistended.  Without guarding or rebound. Extremities: Right ankle bandage clean dry intact, sensation intact distally Vascular:  Dorsalis pedis and posterior tibial pulses palpable bilaterally. Skin:  Warm and dry, no erythema, no ulcerations.   Data Reviewed: I have personally reviewed following labs and imaging studies  CBC: Recent Labs  Lab 06/26/20 2001 06/26/20 2006 06/27/20 0101 06/27/20 0101 06/27/20 0419 06/28/20 0207 06/29/20 0415 06/30/20 0206 07/01/20 0338  WBC 8.6   < > 12.0*  --   --  7.7 7.4 7.0 6.1  NEUTROABS 4.5  --   --   --   --   --   --   --   --   HGB 12.2*   < > 10.7*   < > 10.5* 9.8* 10.7* 10.4* 10.4*  HCT 40.7   < > 33.7*   < > 31.0* 30.5* 32.5* 32.6* 31.5*  MCV 101.2*   < > 96.6  --   --  94.7 95.6 94.8 95.2  PLT 353   < > 289  --   --  259 304 337 373   < > = values in this interval not displayed.   Basic Metabolic Panel: Recent Labs  Lab 06/26/20 2001 06/26/20 2001 06/26/20 2006 06/26/20 2006 06/27/20 0101 06/27/20 0419 06/28/20 0207 06/29/20 0415 07/01/20 0338  NA 141   < > 142   < > 138 141 139 137 139  K 3.7   < > 3.6   < > 4.3 4.1 3.4* 3.4* 3.1*  CL 105   < > 108  --  105  --  105 101 105  CO2 18*  --   --   --  22   --  23 22 23   GLUCOSE 328*   < > 318*  --  204*  --  156* 154* 263*  BUN 24*   < > 27*  --  27*  --  19 11 13   CREATININE 2.00*   < > 1.70*  --  1.95*  --  1.32* 1.20 1.16  CALCIUM 9.9  --   --   --  9.0  --  9.1 9.6 9.6  MG  --   --   --   --  2.3  --  2.0  --   --  PHOS  --   --   --   --  4.6  --  2.3*  --   --    < > = values in this interval not displayed.   GFR: Estimated Creatinine Clearance: 81.5 mL/min (by C-G formula based on SCr of 1.16 mg/dL). Liver Function Tests: Recent Labs  Lab 06/26/20 2001 06/29/20 0415 07/01/20 0338  AST 98* 59* 39  ALT 56* 80* 53*  ALKPHOS 88 91 87  BILITOT 0.8 0.8 0.9  PROT 7.7 7.2 7.4  ALBUMIN 3.5 3.6 3.4*   No results for input(s): LIPASE, AMYLASE in the last 168 hours. No results for input(s): AMMONIA in the last 168 hours. Coagulation Profile: Recent Labs  Lab 06/26/20 2001  INR 1.1   Cardiac Enzymes: No results for input(s): CKTOTAL, CKMB, CKMBINDEX, TROPONINI in the last 168 hours. BNP (last 3 results) No results for input(s): PROBNP in the last 8760 hours. HbA1C: No results for input(s): HGBA1C in the last 72 hours. CBG: Recent Labs  Lab 06/30/20 1051 06/30/20 1602 06/30/20 2115 07/01/20 0622 07/01/20 1111  GLUCAP 156* 121* 146* 142* 118*   Lipid Profile: No results for input(s): CHOL, HDL, LDLCALC, TRIG, CHOLHDL, LDLDIRECT in the last 72 hours. Thyroid Function Tests: No results for input(s): TSH, T4TOTAL, FREET4, T3FREE, THYROIDAB in the last 72 hours. Anemia Panel: No results for input(s): VITAMINB12, FOLATE, FERRITIN, TIBC, IRON, RETICCTPCT in the last 72 hours. Sepsis Labs: Recent Labs  Lab 06/26/20 2124  LATICACIDVEN 2.1*    Recent Results (from the past 240 hour(s))  SARS Coronavirus 2 by RT PCR (hospital order, performed in [facility] hospital lab) Nasopharyngeal Nasopharyngeal Swab     Status: None   Collection Time: 06/26/20  9:10 PM   Specimen: Nasopharyngeal Swab  Result Value Ref Range Status    SARS Coronavirus 2 NEGATIVE NEGATIVE Final    Comment: (NOTE) SARS-CoV-2 target nucleic acids are NOT DETECTED.  The SARS-CoV-2 RNA is generally detectable in upper and lower respiratory specimens during the acute phase of infection. The lowest concentration of SARS-CoV-2 viral copies this assay can detect is 250 copies / mL. A negative result does not preclude SARS-CoV-2 infection and should not be used as the sole basis for treatment or other patient management decisions.  A negative result may occur with improper specimen collection / handling, submission of specimen other than nasopharyngeal swab, presence of viral mutation(s) within the areas targeted by this assay, and inadequate number of viral copies (<250 copies / mL). A negative result must be combined with clinical observations, patient history, and epidemiological information.  Fact Sheet for Patients:   StrictlyIdeas.no  Fact Sheet for Healthcare Providers: BankingDealers.co.za  This test is not yet approved or  cleared by the [place] FDA and has been authorized for detection and/or diagnosis of SARS-CoV-2 by FDA under an Emergency Use Authorization (EUA).  This EUA will remain in effect (meaning this test can be used) for the duration of the COVID-19 declaration under Section 564(b)(1) of the Act, 21 U.S.C. section 360bbb-3(b)(1), unless the authorization is terminated or revoked sooner.  Performed at [facility], [place] [address] [place], [place] [address]   Culture, blood (routine x 2)     Status: None (Preliminary result)   Collection Time: 06/26/20  9:33 PM   Specimen: BLOOD  Result Value Ref Range Status   Specimen Description BLOOD CENTRAL LINE  Final   Special Requests   Final    BOTTLES DRAWN AEROBIC AND  ANAEROBIC Blood Culture adequate volume   Culture   Final    NO GROWTH 4 DAYS Performed at [facility] [address],  [place], [place] [address]    Report Status PENDING  Incomplete  MRSA PCR Screening     Status: None   Collection Time: 06/26/20 11:30 PM   Specimen: Nasopharyngeal  Result Value Ref Range Status   MRSA by PCR NEGATIVE NEGATIVE Final    Comment:        The GeneXpert MRSA Assay (FDA approved for NASAL specimens only), is one component of a comprehensive MRSA colonization surveillance program. It is not intended to diagnose MRSA infection nor to guide or monitor treatment for MRSA infections. Performed at [facility], [place] [address] [place], [place] [address]          Radiology Studies: DG MINI C-ARM IMAGE ONLY  Result Date: 07/01/2020 There is no interpretation for this exam.  This order is for images obtained during a surgical procedure.  Please See "Surgeries" Tab for more information regarding the procedure.    Scheduled Meds: . acetaminophen  1,000 mg Oral Once  . [MAR Hold] Chlorhexidine Gluconate Cloth  6 each Topical Daily  . fentaNYL  100 mcg Intravenous Once  . [MAR Hold] insulin aspart  0-5 Units Subcutaneous QHS  . [MAR Hold] insulin aspart  0-9 Units Subcutaneous TID WC  . [MAR Hold] insulin glargine  10 Units Subcutaneous QHS  . povidone-iodine  2 application Topical Once   Continuous Infusions:    LOS: 5 days   Time spent: 41min  Aivah Putman C Akeia Perot, DO [facility]  If 7PM-7AM, please contact night-coverage [email or web address]  07/01/2020, 1:12 PM

## 2020-07-02 DIAGNOSIS — S82851A Displaced trimalleolar fracture of right lower leg, initial encounter for closed fracture: Secondary | ICD-10-CM

## 2020-07-02 LAB — GLUCOSE, CAPILLARY
Glucose-Capillary: 130 mg/dL — ABNORMAL HIGH (ref 70–99)
Glucose-Capillary: 112 mg/dL — ABNORMAL HIGH (ref 70–99)
Glucose-Capillary: 114 mg/dL — ABNORMAL HIGH (ref 70–99)
Glucose-Capillary: 112 mg/dL — ABNORMAL HIGH (ref 70–99)

## 2020-07-02 LAB — CBC
HCT: 31.7 % — ABNORMAL LOW (ref 39.0–52.0)
Hemoglobin: 10.3 g/dL — ABNORMAL LOW (ref 13.0–17.0)
MCH: 30.7 pg (ref 26.0–34.0)
MCHC: 32.5 g/dL (ref 30.0–36.0)
MCV: 94.6 fL (ref 80.0–100.0)
Platelets: 344 10*3/uL (ref 150–400)
RBC: 3.35 MIL/uL — ABNORMAL LOW (ref 4.22–5.81)
RDW: 14.6 % (ref 11.5–15.5)
WBC: 6.5 10*3/uL (ref 4.0–10.5)
nRBC: 0 % (ref 0.0–0.2)

## 2020-07-02 LAB — CULTURE, BLOOD (ROUTINE X 2)
Culture: NO GROWTH
Special Requests: ADEQUATE

## 2020-07-02 LAB — HEPARIN LEVEL (UNFRACTIONATED)
Heparin Unfractionated: 0.61 IU/mL (ref 0.30–0.70)
Heparin Unfractionated: 0.75 IU/mL — ABNORMAL HIGH (ref 0.30–0.70)
Heparin Unfractionated: 0.57 IU/mL (ref 0.30–0.70)

## 2020-07-02 MED ORDER — SIMVASTATIN 20 MG PO TABS
40.0000 mg | ORAL_TABLET | Freq: Every day | ORAL | Status: DC
  Administered 2020-07-02 – 2020-07-03 (×2): 40 mg via ORAL
  Filled 2020-07-02 (×2): qty 2

## 2020-07-02 MED ORDER — HEPARIN (PORCINE) 25000 UT/250ML-% IV SOLN
1700.0000 [IU]/h | INTRAVENOUS | Status: DC
  Administered 2020-07-03: 1700 [IU]/h via INTRAVENOUS
  Filled 2020-07-02: qty 250

## 2020-07-02 MED ORDER — POTASSIUM CHLORIDE CRYS ER 20 MEQ PO TBCR
40.0000 meq | EXTENDED_RELEASE_TABLET | Freq: Two times a day (BID) | ORAL | Status: AC
  Administered 2020-07-02 (×2): 40 meq via ORAL
  Filled 2020-07-02 (×2): qty 2

## 2020-07-02 MED ORDER — DULOXETINE HCL 60 MG PO CPEP
60.0000 mg | ORAL_CAPSULE | Freq: Every day | ORAL | Status: DC
  Administered 2020-07-02 – 2020-07-04 (×3): 60 mg via ORAL
  Filled 2020-07-02 (×3): qty 1

## 2020-07-02 NOTE — Plan of Care (Signed)

## 2020-07-02 NOTE — Progress Notes (Signed)
Orthopaedic Trauma Service Progress Note Weekend coverage   Patient ID: Keith [name] MRN: [id number] DOB/AGE: [birth year] 69 y.o.  Subjective:  Doing well Pain controlled Has worked with therapy some this am  No other complaints  Block still working   Worried about his cruise in 8 weeks which he has been waiting to go on for 2 years (cancelled last year due to [place])  Appetite is good + flatus Voiding   Heparin has been restarted for PE  ROS As above  Objective:   VITALS:   Vitals:   07/01/20 1932 07/01/20 2300 07/02/20 0300 07/02/20 0818  BP: 136/79 133/75 132/70 136/82  Pulse: 84 80 76 86  Resp: 18 17 17 17   Temp: 97.7 F (36.5 C) 97.9 F (36.6 C) 97.8 F (36.6 C) 98.7 F (37.1 C)  TempSrc: Oral Oral Oral Oral  SpO2: 100% 100% 99% 100%  Weight:      Height:        Estimated body mass index is 34.86 kg/m as calculated from the following:   Height as of this encounter: 6' (1.829 m).   Weight as of this encounter: 116.6 kg.   Intake/Output      09/24 0701 - 09/25 0700 09/25 0701 - 09/26 0700   I.V. (mL/kg) 708.2 (6.1)    IV Piggyback 200    Total Intake(mL/kg) 908.2 (7.8)    Urine (mL/kg/hr) 615 (0.2)    Blood 30    Total Output 645    Net +263.2         Urine Occurrence 1 x      LABS  Results for orders placed or performed during the hospital encounter of 06/26/20 (from the past 24 hour(s))  Glucose, capillary     Status: Abnormal   Collection Time: 07/01/20  2:24 PM  Result Value Ref Range   Glucose-Capillary 161 (H) 70 - 99 mg/dL  Glucose, capillary     Status: Abnormal   Collection Time: 07/01/20  6:12 PM  Result Value Ref Range   Glucose-Capillary 190 (H) 70 - 99 mg/dL  Glucose, capillary     Status: Abnormal   Collection Time: 07/01/20  8:16 PM  Result Value Ref Range   Glucose-Capillary 252 (H) 70 - 99 mg/dL  Heparin level (unfractionated)     Status: None     Collection Time: 07/02/20  1:26 AM  Result Value Ref Range   Heparin Unfractionated 0.61 0.30 - 0.70 IU/mL  CBC     Status: Abnormal   Collection Time: 07/02/20  1:26 AM  Result Value Ref Range   WBC 6.5 4.0 - 10.5 K/uL   RBC 3.35 (L) 4.22 - 5.81 MIL/uL   Hemoglobin 10.3 (L) 13.0 - 17.0 g/dL   HCT 31.7 (L) 39 - 52 %   MCV 94.6 80.0 - 100.0 fL   MCH 30.7 26.0 - 34.0 pg   MCHC 32.5 30.0 - 36.0 g/dL   RDW 14.6 11.5 - 15.5 %   Platelets 344 150 - 400 K/uL   nRBC 0.0 0.0 - 0.2 %  Glucose, capillary     Status: Abnormal   Collection Time: 07/02/20  6:42 AM  Result Value Ref Range   Glucose-Capillary 130 (H) 70 - 99 mg/dL  Glucose, capillary     Status: Abnormal   Collection  Time: 07/02/20 11:17 AM  Result Value Ref Range   Glucose-Capillary 112 (H) 70 - 99 mg/dL  Heparin level (unfractionated)     Status: Abnormal   Collection Time: 07/02/20 11:37 AM  Result Value Ref Range   Heparin Unfractionated 0.75 (H) 0.30 - 0.70 IU/mL     PHYSICAL EXAM:   Gen: awake, resting comfortably in bed, NAD, appears well, pleasant  Lungs: unlabored Cardiac: reg Ext:       Right Lower Extremity   Splint c/d/i  Splint fitting well  Ext is warm   + DP pulse  No motor function of toes  Diminished sensation   No other acute findings noted   No pain with passive stretching   Assessment/Plan: 1 Day Post-Op   Active Problems:   AKI (acute kidney injury) ([place])   Pulmonary embolism (HCC)   Acute respiratory failure with hypoxia (HCC)   Anti-infectives (From admission, onward)   Start     Dose/Rate Route Frequency Ordered Stop   07/01/20 1900  ceFAZolin (ANCEF) IVPB 2g/100 mL premix        2 g 200 mL/hr over 30 Minutes Intravenous Every 6 hours 07/01/20 1708 07/02/20 0846   07/01/20 0600  ceFAZolin (ANCEF) IVPB 2g/100 mL premix        2 g 200 mL/hr over 30 Minutes Intravenous To Surgery 06/30/20 2036 07/01/20 1305    .  POD/HD#: 1  69 y/o male s/p fall with R ankle  fracture  -Fall  - R ankle fracture (SER pattern) s/p ORIF   NWB x 6-8 weeks  Ice and elevate for swelling and pain   PT/OT  Splint until office follow up     - Pain management:  Multimodal   Ice/elevate  On pain meds chronically    - ABL anemia/Hemodynamics  Stable  - Medical issues   Per primary   - PE  Heparin restarted  Ok to transition to PO agent from ortho standpoint    - ID:   periop abx    - Dispo:  Ortho issues addressed  Follow up with Dr. Mardelle Matte in 10-14 days    Jari Pigg, PA-C [phone] (C) 07/02/2020, 12:56 PM  [facility] [place] [address] [phone] Domingo Sep (F)

## 2020-07-02 NOTE — Progress Notes (Signed)
The patient pulled his IV out accidentally and IV team has been consulted for restart.

## 2020-07-02 NOTE — Evaluation (Signed)
Physical Therapy Re-Evaluation Patient Details Name: Keith [name] MRN: [id number] DOB: 06/05/1951 Today's Date: 07/02/2020   History of Present Illness  Pt is a 69 y.o. male admitted 06/26/20 with near syncope after going to the bathroom. Pt with saddle PE with obstructive shock s/p tPA and heparin. On 9/21, pt with slip on wet floor while standing, sustained R trimalleolar ankle fracture with dorsal dislocation. S/p ORIF 07/01/2020. PMH includes chronic back pain, opiate use, DM, scoliosis, T11-12 laminectomy.  Clinical Impression  Pt re-evaluated s/p right ankle ORIF 9/24. Nerve block still in effect so pt not able to wiggle toes yet. Presents with decreased functional mobility secondary to RLE weakness, weightbearing precautions, balance deficits. Requiring two person min-mod assist for transfers using a walker. Unable to hop. 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 Recommend CIR to maximize functional independence. Suspect good progress given age, PLOF and motivation.     Follow Up Recommendations CIR    Equipment Recommendations  None recommended by PT    Recommendations for Other Services       Precautions / Restrictions Precautions Precautions: Fall Restrictions Weight Bearing Restrictions: Yes RLE Weight Bearing: Non weight bearing      Mobility  Bed Mobility Overal bed mobility: Needs Assistance Bed Mobility: Supine to Sit     Supine to sit: Supervision     General bed mobility comments: Close supervision, use of bed rail, increased time/effort and use of momentum  Transfers Overall transfer level: Needs assistance Equipment used: Rolling walker (2 wheeled) Transfers: Sit to/from [facility] Sit to Stand: Min assist;+2 physical assistance Stand pivot transfers: Mod assist;+2 physical assistance       General transfer comment: MinA + 2 to power up into standing; cues for hand/right foot placement. ModA + 2 for pivot towards  left to chair. Cues for sequencing/direction. Good maintenance of weightbearing precautions  Ambulation/Gait                Stairs            Wheelchair Mobility    Modified Rankin (Stroke Patients Only)       Balance Overall balance assessment: Needs assistance   Sitting balance-Leahy Scale: Good       Standing balance-Leahy Scale: Poor                               Pertinent Vitals/Pain Pain Assessment: No/denies pain    Home Living Family/patient expects to be discharged to:: Private residence Living Arrangements: Spouse/significant other Available Help at Discharge: Family;Available 24 hours/day Type of Home: House Home Access: Stairs to enter Entrance Stairs-Rails: Right Entrance Stairs-Number of Steps: 2 Home Layout: Two level Home Equipment: Walker - 4 wheels;Cane - single point;Grab bars - toilet;Grab bars - tub/shower;Bedside commode;Adaptive equipment;Wheelchair - manual;Shower seat      Prior Function Level of Independence: Independent with assistive device(s)         Comments: rollator     Hand Dominance   Dominant Hand: Right    Extremity/Trunk Assessment   Upper Extremity Assessment Upper Extremity Assessment: Overall WFL for tasks assessed    Lower Extremity Assessment Lower Extremity Assessment: RLE deficits/detail RLE Deficits / Details: Unable to wiggle toes quite yet, grossly 2/5 strength in hip/knee       Communication   Communication: No difficulties  Cognition Arousal/Alertness: Awake/alert Behavior During Therapy: WFL for tasks assessed/performed Overall Cognitive Status: Within Functional Limits for  tasks assessed                                        General Comments      Exercises General Exercises - Lower Extremity Quad Sets: Both;15 reps;Supine Heel Slides: AAROM;Right;10 reps;Supine Hip ABduction/ADduction: AAROM;Right;10 reps;Supine   Assessment/Plan    PT  Assessment Patient needs continued PT services  PT Problem List Decreased mobility;Decreased safety awareness;Decreased activity tolerance;Decreased range of motion;Pain       PT Treatment Interventions DME instruction;Therapeutic exercise;Gait training;Functional mobility training;Therapeutic activities;Patient/family education;Stair training;Balance training    PT Goals (Current goals can be found in the Care Plan section)  Acute Rehab PT Goals Patient Stated Goal: Agreeable for post-acute rehab to regain strength before return home PT Goal Formulation: With patient/family Time For Goal Achievement: 07/16/20 Potential to Achieve Goals: Good    Frequency Min 5X/week   Barriers to discharge        Co-evaluation               AM-PAC PT "6 Clicks" Mobility  Outcome Measure Help needed turning from your back to your side while in a flat bed without using bedrails?: None Help needed moving from lying on your back to sitting on the side of a flat bed without using bedrails?: None Help needed moving to and from a bed to a chair (including a wheelchair)?: A Lot Help needed standing up from a chair using your arms (e.g., wheelchair or bedside chair)?: A Little Help needed to walk in hospital room?: Total Help needed climbing 3-5 steps with a railing? : Total 6 Click Score: 15    End of Session Equipment Utilized During Treatment: Gait belt Activity Tolerance: Patient tolerated treatment well Patient left: in chair;with call bell/phone within reach;with chair alarm set Nurse Communication: Mobility status PT Visit Diagnosis: Other abnormalities of gait and mobility (R26.89);Difficulty in walking, not elsewhere classified (R26.2);Muscle weakness (generalized) (M62.81);Pain Pain - Right/Left: Right Pain - part of body: Ankle and joints of foot    Time: 1412-1445 PT Time Calculation (min) (ACUTE ONLY): 33 min   Charges:   PT Evaluation $PT Re-evaluation: 1 Re-eval PT  Treatments $Therapeutic Activity: 8-22 mins          Keith [name], PT, DPT Acute Rehabilitation Services Pager [phone] Office [phone]   Keith [name] 07/02/2020, 4:07 PM

## 2020-07-02 NOTE — Progress Notes (Signed)
[place] for Heparin Indication: pulmonary embolus  HEPARIN DW (KG): 104.2  Assessment: 38 YOM who presents with massive PE and saddle embolus and associated R heart strain. Patient to receive systemic thrombolysis with IV tPA. Pharmacy consulted to start IV heparin post tPA (tPA finished ~0100 9/20). He is noted with an ankle fracture and plans noted for OR on 9/24.   Patient is s/p ORIF ankle fracture - pharmacy consulted to resume IV heparin. AET 14:24. Previously therapeutic on 1800 units/hr. Heparin level initially therapeutic after re-start s/p OR; now slightly supratherapeutic at 0.75. Will adjust infusion by 1 U/kg/hr and recheck HL in 6 hours.  Goal of Therapy:  Heparin level = 0.3 - 0.7 units/mL  Monitor platelets by anticoagulation protocol: Yes  Plan Decrease heparin infusion to 1700 units/hr 1800 heparin level   Alfonse Spruce, PharmD PGY2 ID Pharmacy Resident Phone between 7 am - 3:30 pm: [phone]  Please check AMION for all [place] phone numbers After 10:00 PM, call [place] [phone]

## 2020-07-02 NOTE — Evaluation (Signed)
Occupational Therapy Evaluation Patient Details Name: Keith [name] MRN: [id number] DOB: [birth year] Today's Date: 07/02/2020    History of Present Illness Pt is a 69 y.o. male admitted 06/26/20 with near syncope after going to the bathroom. Pt with saddle PE with obstructive shock s/p tPA and heparin. On 9/21, pt with slip on wet floor while standing, sustained R trimalleolar ankle fracture with dorsal dislocation. Plan for ORIF 9/24. PMH includes chronic back pain, opiate use, DM, scoliosis, T11-12 laminectomy.   Clinical Impression   Pt. Was seen for OT re-evaluation following ORIF on 07/01/20. Pt. Refused to attempt to stand secondary to fear of falling and L LE weakness. Pt. Is now nwb on R LE and his L LE is very weak. Pt. Does not appear to be able to maintain NWB on R LE and stand with L LE. Pt. Needed to use B UE to move B LE. Pt. Is an agreement with need for SNF for rehab. Acute OT to follow.    Follow Up Recommendations  SNF    Equipment Recommendations  3 in 1 bedside commode    Recommendations for Other Services       Precautions / Restrictions Precautions Precautions: Fall Precaution Comments: r ankle fx with orif Restrictions Weight Bearing Restrictions: Yes RLE Weight Bearing: Non weight bearing Other Position/Activity Restrictions: L LE wrapped      Mobility Bed Mobility Overal bed mobility: Needs Assistance Bed Mobility: Supine to Sit Rolling: Modified independent (Device/Increase time)   Supine to sit: Supervision Sit to supine: Min assist;HOB elevated   General bed mobility comments: Pt. is using his arms to assist with b le.   Transfers Overall transfer level: Needs assistance               General transfer comment: Pt. refused to attempt to stand.     Balance Overall balance assessment: Needs assistance   Sitting balance-Leahy Scale: Good       Standing balance-Leahy Scale: Poor                             ADL either  performed or assessed with clinical judgement   ADL Overall ADL's : Needs assistance/impaired Eating/Feeding: Independent   Grooming: Wash/dry hands;Wash/dry face;Oral care;Set up;Sitting Grooming Details (indicate cue type and reason): EOB Upper Body Bathing: Set up;Sitting Upper Body Bathing Details (indicate cue type and reason): EOB Lower Body Bathing: Moderate assistance;Sitting/lateral leans   Upper Body Dressing : Set up;Sitting   Lower Body Dressing: Moderate assistance;Sitting/lateral leans               Functional mobility during ADLs:  (Pt. did  not want to get OOB today) General ADL Comments: ADLs sitting EOB     Vision Baseline Vision/History: Wears glasses Wears Glasses: At all times Patient Visual Report: No change from baseline       Perception     Praxis      Pertinent Vitals/Pain Pain Assessment: 0-10 Pain Score: 4  Pain Location: r ankle Pain Descriptors / Indicators: Sore Pain Intervention(s): Monitored during session (does not want pain medicine)     Hand Dominance Right   Extremity/Trunk Assessment Upper Extremity Assessment Upper Extremity Assessment: Overall WFL for tasks assessed   Lower Extremity Assessment Lower Extremity Assessment: Defer to PT evaluation       Communication Communication Communication: No difficulties   Cognition Arousal/Alertness: Awake/alert Behavior During Therapy: WFL for tasks assessed/performed Overall Cognitive Status:  Within Functional Limits for tasks assessed                                     General Comments       Exercises     Shoulder Instructions      Home Living Family/patient expects to be discharged to:: Private residence Living Arrangements: Spouse/significant other Available Help at Discharge: Family;Available 24 hours/day Type of Home: House Home Access: Stairs to enter [employer] of Steps: 2 Entrance Stairs-Rails: Right Home Layout: Two  level Alternate Level Stairs-Number of Steps: 13 Alternate Level Stairs-Rails: Right Bathroom Shower/Tub: Occupational psychologist: Handicapped height Bathroom Accessibility: Yes How Accessible: Accessible via walker Home Equipment: [place] - 4 wheels;Cane - single point;Grab bars - toilet;Grab bars - tub/shower;Bedside commode;Adaptive equipment;Wheelchair - Designer, fashion/clothing: Reacher;Sock aid;Long-handled shoe horn;Long-handled sponge        Prior Functioning/Environment Level of Independence: Independent with assistive device(s)        Comments: rollator        OT Problem List:        OT Treatment/Interventions: Self-care/ADL training;Therapeutic exercise;DME and/or AE instruction;Therapeutic activities;Balance training    OT Goals(Current goals can be found in the care plan section) Acute Rehab OT Goals Patient Stated Goal: Agreeable for post-acute rehab to regain strength before return home OT Goal Formulation: With patient Time For Goal Achievement: 07/13/20 Potential to Achieve Goals: Good  OT Frequency: Min 2X/week   Barriers to D/C: Decreased caregiver support          Co-evaluation              AM-PAC OT "6 Clicks" Daily Activity     Outcome Measure Help from another person eating meals?: None Help from another person taking care of personal grooming?: None Help from another person toileting, which includes using toliet, bedpan, or urinal?: A Lot Help from another person bathing (including washing, rinsing, drying)?: A Lot Help from another person to put on and taking off regular upper body clothing?: None Help from another person to put on and taking off regular lower body clothing?: A Lot 6 Click Score: 18   End of Session Nurse Communication:  (ok therapy)  Activity Tolerance: Patient tolerated treatment well Patient left: in bed;with call bell/phone within reach;with bed alarm set  OT Visit Diagnosis: Unsteadiness  on feet (R26.81);Repeated falls (R29.6);Muscle weakness (generalized) (M62.81);Pain                Time: 3664-4034 OT Time Calculation (min): 40 min Charges:  OT Evaluation $OT Re-eval: 1 Re-eval OT Treatments $Self Care/Home Management : 8-22 mins  Reece Packer OT/L   Anivea Velasques 07/02/2020, 12:20 PM

## 2020-07-02 NOTE — Progress Notes (Signed)
PROGRESS NOTE    ANTWOINE ZORN  JKK:[id number] DOB: Jan 04, 1951 DOA: 06/26/2020 PCP: [name], [name], MD   Brief Narrative:  Pt presented with near syncope after returning from the bathroom.  Also complained of chest pain like "elephant sitting on his chest" noted to be hypoxic with saturation in the 80s, blood pressure in the 50s, brought in by EMS as a code STEMI .  Placed on nonrebreather, initial blood pressure 70s, started on Levophed, central line placed in ED, CT angiogram obtained which confirmed saddle PE. Recent admission 3 weeks ago for acute on chronic back pain he is on chronic opiates for back pain for at least 2 years by neurosurgery.  He reports since his admission he has mostly been sitting around, limited movement.   Patient admitted as above with atypical chest pain hypoxia found to have massive saddle PE as well as left lower extremity DVT likely provoked in the setting of poor ambulatory status and prolonged sedentary lifestyle.  Patient tolerated TPA quite well, now on heparin drip likely transition over to p.o. anticoagulation the next 24 to 48 hours pending clinical course was post operatively.   Assessment & Plan:   Principal Problem:   Acute saddle pulmonary embolism with acute cor pulmonale (HCC) Active Problems:   Morbid obesity ([place])   Osteoarthritis   Spinal stenosis of lumbar region   AKI (acute kidney injury) ([place])   Hyperlipidemia associated with type 2 diabetes mellitus ([place])   Type 2 diabetes mellitus with diabetic neuropathy, unspecified (HCC)   Acute respiratory failure with hypoxia (HCC)   Closed right trimalleolar fracture   Acute massive/saddle PE with hemodynamic compromise and near syncope, POA - Status post TPA followed by IV heparin  - 2D echo - without overt findings as below - Left lower extremity DVT noted on ultrasound - BP improving after lytics - continue holding home meds -Continue IV heparin, likely transition to p.o.  anticoagulation with DOAC postoperatively once cleared with orthopedic surgery  Acute hypoxic respiratory failure -related to above, titrate oxygen as needed resolved - Ambulatory oxygen screen daily, wean as tolerated -Currently on room air  Ambulatory dysfunction, acute mechanical fall  Right trimalleolar ankle fracture with dorsal dislocation - Orthopedics following, reduced 06/28/2020 - ORIF 07/01/2020 tolerated procedure well; pain well controlled - Patient has known chronic ambulatory dysfunction, will continue PT/OT with close monitoring given high fall risk -currently being evaluated for discharge to CIR  AKI, resolved - Related to above, continue to advance diet, increase p.o. intake as tolerated Lab Results  Component Value Date   CREATININE 1.16 07/01/2020   CREATININE 1.20 06/29/2020   CREATININE 1.32 (H) 06/28/2020   Chronic opiate use - Resume oxycodone - Holding OxyContin for now - May require short acting narcotics postoperatively - currently doing well with nerve block  Uncontrolled diabetes -likely related to steroids that he recently received - Start Lantus 10 units - SSI resistant scale - A1C 6.7  DVT prophylaxis: Heparin drip Code Status: Full Family Communication: Wife at bedside  Status is: Inpatient  Dispo: The patient is from: Home              Anticipated d/c is to: Likely CIR versus SNF pending ambulatory status postoperatively              Anticipated d/c date is: 24 to 48 hours pending clinical course              Patient currently not medically stable for discharge given  ongoing need for IV anticoagulation in the setting of saddle PE, surgical procedure planned later today, disposition pending advancement of anticoagulation to p.o. as well as safe discharge location and ambulatory status.  Consultants:   PCCM, orthopedic surgery  Procedures:   TPA administration as above  Antimicrobials:  None  Subjective: No acute issues or events  overnight, tolerated procedure well - pain currently well controlled, denies nausea, vomiting, diarrhea, constipation, headache, fevers, chills.  Objective: Vitals:   07/01/20 2300 07/02/20 0300 07/02/20 0818 07/02/20 1258  BP: 133/75 132/70 136/82 (!) 142/86  Pulse: 80 76 86 96  Resp: 17 17 17 17   Temp: 97.9 F (36.6 C) 97.8 F (36.6 C) 98.7 F (37.1 C) 98.9 F (37.2 C)  TempSrc: Oral Oral Oral Oral  SpO2: 100% 99% 100% 100%  Weight:      Height:        Intake/Output Summary (Last 24 hours) at 07/02/2020 1321 Last data filed at 07/02/2020 0500 Gross per 24 hour  Intake 808.24 ml  Output 470 ml  Net 338.24 ml   Filed Weights   06/29/20 0616 06/30/20 0531 07/01/20 0351  Weight: 120.4 kg 116.6 kg 116.6 kg    Examination:  General:  Pleasantly resting in bed, No acute distress. HEENT:  Normocephalic atraumatic.  Sclerae nonicteric, noninjected.  Extraocular movements intact bilaterally. Neck:  Without mass or deformity.  Trachea is midline. Lungs:  Clear to auscultate bilaterally without rhonchi, wheeze, or rales. Heart:  Regular rate and rhythm.  Without murmurs, rubs, or gallops. Abdomen:  Soft, nontender, nondistended.  Without guarding or rebound. Extremities: Right ankle bandage clean dry intact, sensation intact distally Vascular:  Dorsalis pedis and posterior tibial pulses palpable bilaterally. Skin:  Warm and dry, no erythema, no ulcerations.   Data Reviewed: I have personally reviewed following labs and imaging studies  CBC: Recent Labs  Lab 06/26/20 2001 06/26/20 2006 06/28/20 0207 06/29/20 0415 06/30/20 0206 07/01/20 0338 07/02/20 0126  WBC 8.6   < > 7.7 7.4 7.0 6.1 6.5  NEUTROABS 4.5  --   --   --   --   --   --   HGB 12.2*   < > 9.8* 10.7* 10.4* 10.4* 10.3*  HCT 40.7   < > 30.5* 32.5* 32.6* 31.5* 31.7*  MCV 101.2*   < > 94.7 95.6 94.8 95.2 94.6  PLT 353   < > 259 304 337 373 344   < > = values in this interval not displayed.   Basic Metabolic  Panel: Recent Labs  Lab 06/26/20 2001 06/26/20 2001 06/26/20 2006 06/26/20 2006 06/27/20 0101 06/27/20 0419 06/28/20 0207 06/29/20 0415 07/01/20 0338  NA 141   < > 142   < > 138 141 139 137 139  K 3.7   < > 3.6   < > 4.3 4.1 3.4* 3.4* 3.1*  CL 105   < > 108  --  105  --  105 101 105  CO2 18*  --   --   --  22  --  23 22 23   GLUCOSE 328*   < > 318*  --  204*  --  156* 154* 263*  BUN 24*   < > 27*  --  27*  --  19 11 13   CREATININE 2.00*   < > 1.70*  --  1.95*  --  1.32* 1.20 1.16  CALCIUM 9.9  --   --   --  9.0  --  9.1 9.6 9.6  MG  --   --   --   --  2.3  --  2.0  --   --   PHOS  --   --   --   --  4.6  --  2.3*  --   --    < > = values in this interval not displayed.   GFR: Estimated Creatinine Clearance: 81.5 mL/min (by C-G formula based on SCr of 1.16 mg/dL). Liver Function Tests: Recent Labs  Lab 06/26/20 2001 06/29/20 0415 07/01/20 0338  AST 98* 59* 39  ALT 56* 80* 53*  ALKPHOS 88 91 87  BILITOT 0.8 0.8 0.9  PROT 7.7 7.2 7.4  ALBUMIN 3.5 3.6 3.4*   No results for input(s): LIPASE, AMYLASE in the last 168 hours. No results for input(s): AMMONIA in the last 168 hours. Coagulation Profile: Recent Labs  Lab 06/26/20 2001  INR 1.1   Cardiac Enzymes: No results for input(s): CKTOTAL, CKMB, CKMBINDEX, TROPONINI in the last 168 hours. BNP (last 3 results) No results for input(s): PROBNP in the last 8760 hours. HbA1C: No results for input(s): HGBA1C in the last 72 hours. CBG: Recent Labs  Lab 07/01/20 1424 07/01/20 1812 07/01/20 2016 07/02/20 0642 07/02/20 1117  GLUCAP 161* 190* 252* 130* 112*   Lipid Profile: No results for input(s): CHOL, HDL, LDLCALC, TRIG, CHOLHDL, LDLDIRECT in the last 72 hours. Thyroid Function Tests: No results for input(s): TSH, T4TOTAL, FREET4, T3FREE, THYROIDAB in the last 72 hours. Anemia Panel: No results for input(s): VITAMINB12, FOLATE, FERRITIN, TIBC, IRON, RETICCTPCT in the last 72 hours. Sepsis Labs: Recent Labs  Lab  06/26/20 2124  LATICACIDVEN 2.1*    Recent Results (from the past 240 hour(s))  SARS Coronavirus 2 by RT PCR (hospital order, performed in [facility] hospital lab) Nasopharyngeal Nasopharyngeal Swab     Status: None   Collection Time: 06/26/20  9:10 PM   Specimen: Nasopharyngeal Swab  Result Value Ref Range Status   SARS Coronavirus 2 NEGATIVE NEGATIVE Final    Comment: (NOTE) SARS-CoV-2 target nucleic acids are NOT DETECTED.  The SARS-CoV-2 RNA is generally detectable in upper and lower respiratory specimens during the acute phase of infection. The lowest concentration of SARS-CoV-2 viral copies this assay can detect is 250 copies / mL. A negative result does not preclude SARS-CoV-2 infection and should not be used as the sole basis for treatment or other patient management decisions.  A negative result may occur with improper specimen collection / handling, submission of specimen other than nasopharyngeal swab, presence of viral mutation(s) within the areas targeted by this assay, and inadequate number of viral copies (<250 copies / mL). A negative result must be combined with clinical observations, patient history, and epidemiological information.  Fact Sheet for Patients:   StrictlyIdeas.no  Fact Sheet for Healthcare Providers: BankingDealers.co.za  This test is not yet approved or  cleared by the [place] FDA and has been authorized for detection and/or diagnosis of SARS-CoV-2 by FDA under an Emergency Use Authorization (EUA).  This EUA will remain in effect (meaning this test can be used) for the duration of the COVID-19 declaration under Section 564(b)(1) of the Act, 21 U.S.C. section 360bbb-3(b)(1), unless the authorization is terminated or revoked sooner.  Performed at [facility], [place] [address], [place], [place] [address]   Culture, blood (routine x 2)     Status: None   Collection Time: 06/26/20  9:33  PM   Specimen: BLOOD  Result Value Ref Range  Status   Specimen Description BLOOD CENTRAL LINE  Final   Special Requests   Final    BOTTLES DRAWN AEROBIC AND ANAEROBIC Blood Culture adequate volume   Culture   Final    NO GROWTH 6 DAYS Performed at [facility], [address] [place], [place] [address]    Report Status 07/02/2020 FINAL  Final  MRSA PCR Screening     Status: None   Collection Time: 06/26/20 11:30 PM   Specimen: Nasopharyngeal  Result Value Ref Range Status   MRSA by PCR NEGATIVE NEGATIVE Final    Comment:        The GeneXpert MRSA Assay (FDA approved for NASAL specimens only), is one component of a comprehensive MRSA colonization surveillance program. It is not intended to diagnose MRSA infection nor to guide or monitor treatment for MRSA infections. Performed at [facility], [place] [address] [place], [place] [address]          Radiology Studies: DG MINI C-ARM IMAGE ONLY  Result Date: 07/01/2020 There is no interpretation for this exam.  This order is for images obtained during a surgical procedure.  Please See "Surgeries" Tab for more information regarding the procedure.    Scheduled Meds: . DULoxetine  60 mg Oral Daily  . insulin aspart  0-5 Units Subcutaneous QHS  . insulin aspart  0-9 Units Subcutaneous TID WC  . insulin glargine  10 Units Subcutaneous QHS  . potassium chloride  40 mEq Oral BID  . simvastatin  40 mg Oral QHS   Continuous Infusions: . 0.45 % NaCl with KCl 20 mEq / L 75 mL/hr at 07/02/20 0645  . heparin 1,700 Units/hr (07/02/20 1308)     LOS: 6 days   Time spent: 6min  Pennye Beeghly C Miriya Cloer, DO [facility]  If 7PM-7AM, please contact night-coverage [email or web address]  07/02/2020, 1:21 PM

## 2020-07-02 NOTE — Progress Notes (Signed)
[place] for Heparin Indication: pulmonary embolus  HEPARIN DW (KG): 104.2   Assessment: 43 YOM who presents with massive PE and saddle embolus and associated R heart strain. Patient to receive systemic thrombolysis with IV tPA. Pharmacy consulted to start IV heparin post tPA (tPA finished ~0100 9/20). He is noted with an ankle fracture and plans noted for OR on 9/24.   Patient is s/p ORIF ankle fracture - pharmacy consulted to resume IV heparin. AET 14:24. Previously therapeutic on 1800 units/hr.  9/25 AM update:  Heparin level therapeutic after re-start s/p OR  Goal of Therapy:  Heparin level = 0.3 - 0.7 units/mL  Monitor platelets by anticoagulation protocol: Yes  Plan Cont heparin at 1800 units/hr 1200 heparin level   Narda Bonds, PharmD, BCPS Clinical Pharmacist Phone: [phone]

## 2020-07-02 NOTE — Progress Notes (Signed)
[place] for Heparin Indication: pulmonary embolus  HEPARIN DW (KG): 104.2  Assessment: 73 YOM who presents with massive PE and saddle embolus and associated R heart strain. Patient to receive systemic thrombolysis with IV tPA. Pharmacy consulted to start IV heparin post tPA (tPA finished ~0100 9/20). He is noted with an ankle fracture and plans noted for OR on 9/24.   Patient is s/p ORIF ankle fracture - pharmacy consulted to resume IV heparin. AET 14:24. Heparin level came back therapeutic at 0.57, on 1700 units/hr. No s/sx of bleeding or infusion issues.   Goal of Therapy:  Heparin level = 0.3 - 0.7 units/mL  Monitor platelets by anticoagulation protocol: Yes  Plan Continue heparin infusion at 1700 units/hr Monitor daily HL, CBC, and for s/sx of bleeding  Antonietta Jewel, PharmD, [place] Pharmacist  Phone: [phone] 07/02/2020 10:02 PM  Please check AMION for all [place] phone numbers After 10:00 PM, call [place] [phone]

## 2020-07-02 NOTE — Plan of Care (Signed)

## 2020-07-03 LAB — GLUCOSE, CAPILLARY
Glucose-Capillary: 117 mg/dL — ABNORMAL HIGH (ref 70–99)
Glucose-Capillary: 111 mg/dL — ABNORMAL HIGH (ref 70–99)
Glucose-Capillary: 102 mg/dL — ABNORMAL HIGH (ref 70–99)
Glucose-Capillary: 153 mg/dL — ABNORMAL HIGH (ref 70–99)

## 2020-07-03 LAB — COMPREHENSIVE METABOLIC PANEL
ALT: 42 U/L (ref 0–44)
AST: 59 U/L — ABNORMAL HIGH (ref 15–41)
Albumin: 3.2 g/dL — ABNORMAL LOW (ref 3.5–5.0)
Alkaline Phosphatase: 78 U/L (ref 38–126)
Anion gap: 11 (ref 5–15)
BUN: 16 mg/dL (ref 8–23)
CO2: 19 mmol/L — ABNORMAL LOW (ref 22–32)
Calcium: 9.5 mg/dL (ref 8.9–10.3)
Chloride: 107 mmol/L (ref 98–111)
Creatinine, Ser: 1.12 mg/dL (ref 0.61–1.24)
GFR calc Af Amer: >60 mL/min (ref >60)
GFR calc non Af Amer: >60 mL/min (ref >60)
Glucose, Bld: 121 mg/dL — ABNORMAL HIGH (ref 70–99)
Potassium: 4.3 mmol/L (ref 3.5–5.1)
Sodium: 137 mmol/L (ref 135–145)
Total Bilirubin: 0.6 mg/dL (ref 0.3–1.2)
Total Protein: 6.9 g/dL (ref 6.5–8.1)

## 2020-07-03 LAB — CBC
HCT: 30.3 % — ABNORMAL LOW (ref 39.0–52.0)
Hemoglobin: 9.8 g/dL — ABNORMAL LOW (ref 13.0–17.0)
MCH: 31.5 pg (ref 26.0–34.0)
MCHC: 32.3 g/dL (ref 30.0–36.0)
MCV: 97.4 fL (ref 80.0–100.0)
Platelets: 366 10*3/uL (ref 150–400)
RBC: 3.11 MIL/uL — ABNORMAL LOW (ref 4.22–5.81)
RDW: 15.3 % (ref 11.5–15.5)
WBC: 9.8 10*3/uL (ref 4.0–10.5)
nRBC: 0 % (ref 0.0–0.2)

## 2020-07-03 LAB — HEPARIN LEVEL (UNFRACTIONATED): Heparin Unfractionated: 0.81 IU/mL — ABNORMAL HIGH (ref 0.30–0.70)

## 2020-07-03 MED ORDER — APIXABAN 5 MG PO TABS
10.0000 mg | ORAL_TABLET | Freq: Two times a day (BID) | ORAL | Status: AC
  Administered 2020-07-03: 10 mg via ORAL
  Filled 2020-07-03: qty 2

## 2020-07-03 MED ORDER — HEPARIN (PORCINE) 25000 UT/250ML-% IV SOLN
1500.0000 [IU]/h | INTRAVENOUS | Status: AC
  Administered 2020-07-03: 1500 [IU]/h via INTRAVENOUS
  Filled 2020-07-03: qty 250

## 2020-07-03 MED ORDER — APIXABAN 5 MG PO TABS
5.0000 mg | ORAL_TABLET | Freq: Two times a day (BID) | ORAL | Status: DC
  Administered 2020-07-04: 5 mg via ORAL
  Filled 2020-07-03: qty 1

## 2020-07-03 NOTE — Discharge Instructions (Signed)
Information on my medicine - ELIQUIS (apixaban) This medication education was reviewed with me or my healthcare representative as part of my discharge preparation.    Why was Eliquis prescribed for you? Eliquis was prescribed to treat blood clots that may have been found in the veins of your legs (deep vein thrombosis) or in your lungs (pulmonary embolism) and to reduce the risk of them occurring again.  What do You need to know about Eliquis ? The starting dose is ONE 5 mg tablet taken TWICE daily.  Eliquis may be taken with or without food.   Try to take the dose about the same time in the morning and in the evening. If you have difficulty swallowing the tablet whole please discuss with your pharmacist how to take the medication safely.  Take Eliquis exactly as prescribed and DO NOT stop taking Eliquis without talking to the doctor who prescribed the medication.  Stopping may increase your risk of developing a new blood clot.  Refill your prescription before you run out.  After discharge, you should have regular check-up appointments with your healthcare provider that is prescribing your Eliquis.    What do you do if you miss a dose? If a dose of ELIQUIS is not taken at the scheduled time, take it as soon as possible on the same day and twice-daily administration should be resumed. The dose should not be doubled to make up for a missed dose.  Important Safety Information A possible side effect of Eliquis is bleeding. You should call your healthcare provider right away if you experience any of the following: ? Bleeding from an injury or your nose that does not stop. ? Unusual colored urine (red or dark brown) or unusual colored stools (red or black). ? Unusual bruising for unknown reasons. ? A serious fall or if you hit your head (even if there is no bleeding).  Some medicines may interact with Eliquis and might increase your risk of bleeding or clotting while on Eliquis. To  help avoid this, consult your healthcare provider or pharmacist prior to using any new prescription or non-prescription medications, including herbals, vitamins, non-steroidal anti-inflammatory drugs (NSAIDs) and supplements.  This website has more information on Eliquis (apixaban): http://www.eliquis.com/eliquis/home    Diet: As you were doing prior to hospitalization   Shower:  May shower but keep the wounds dry, use an occlusive plastic wrap, NO SOAKING IN TUB.  If the bandage gets wet, change with a clean dry gauze.  If you have a splint on, leave the splint in place and keep the splint dry with a plastic bag.  Dressing:  You may change your dressing 3-5 days after surgery, unless you have a splint.  If you have a splint, then just leave the splint in place and we will change your bandages during your first follow-up appointment.    If you had hand or foot surgery, we will plan to remove your stitches in about 2 weeks in the office.  For all other surgeries, there are sticky tapes (steri-strips) on your wounds and all the stitches are absorbable.  Leave the steri-strips in place when changing your dressings, they will peel off with time, usually 2-3 weeks.  Activity:  Increase activity slowly as tolerated, but follow the weight bearing instructions below.  The rules on driving is that you can not be taking narcotics while you drive, and you must feel in control of the vehicle.    Weight Bearing:  Do not bear weight on  right foot, will need to use crutches, knee walker, or wheel chair. Keep splint on until follow up in our office  To prevent constipation: you may use a stool softener such as -  Colace (over the counter) 100 mg by mouth twice a day  Drink plenty of fluids (prune juice may be helpful) and high fiber foods Miralax (over the counter) for constipation as needed.    Itching:  If you experience itching with your medications, try taking only a single pain pill, or even half a  pain pill at a time.  You may take up to 10 pain pills per day, and you can also use benadryl over the counter for itching or also to help with sleep.   Precautions:  If you experience chest pain or shortness of breath - call 911 immediately for transfer to the hospital emergency department!!  If you develop a fever greater that 101 F, purulent drainage from wound, increased redness or drainage from wound, or calf pain -- Call the office at [phone]                                                Follow- Up Appointment:  Please call for an appointment to be seen in 2 weeks [place] - [phone]

## 2020-07-03 NOTE — Progress Notes (Signed)
0545 Pt encouraged and educated  to elevate right leg on pillows but refused.

## 2020-07-03 NOTE — Plan of Care (Signed)

## 2020-07-03 NOTE — Progress Notes (Signed)
PROGRESS NOTE    Keith [name]  IEP:[id number] DOB: 01/15/1951 DOA: 06/26/2020 PCP: [name], [name], MD   Brief Narrative:  Pt presented with near syncope after returning from the bathroom.  Also complained of chest pain like "elephant sitting on his chest" noted to be hypoxic with saturation in the 80s, blood pressure in the 50s, brought in by EMS as a code STEMI .  Placed on nonrebreather, initial blood pressure 70s, started on Levophed, central line placed in ED, CT angiogram obtained which confirmed saddle PE. Recent admission 3 weeks ago for acute on chronic back pain he is on chronic opiates for back pain for at least 2 years by neurosurgery.  He reports since his admission he has mostly been sitting around, limited movement.   Patient admitted as above with atypical chest pain hypoxia found to have massive saddle PE as well as left lower extremity DVT likely provoked in the setting of poor ambulatory status and prolonged sedentary lifestyle.  Patient tolerated TPA quite well, now on heparin drip likely transition over to p.o. anticoagulation the next 24 to 48 hours pending clinical course was post operatively.  Assessment & Plan:   Principal Problem:   Acute saddle pulmonary embolism with acute cor pulmonale (HCC) Active Problems:   Morbid obesity ([place])   Osteoarthritis   Spinal stenosis of lumbar region   AKI (acute kidney injury) ([place])   Hyperlipidemia associated with type 2 diabetes mellitus ([place])   Type 2 diabetes mellitus with diabetic neuropathy, unspecified (HCC)   Acute respiratory failure with hypoxia (HCC)   Closed right trimalleolar fracture  Acute massive/saddle PE with hemodynamic compromise and near syncope, POA - Status post TPA followed by IV heparin -transitioning to p.o. Eliquis now in preparation for discharge - 2D echo - without overt findings as below - Left lower extremity DVT noted on ultrasound - BP improving after lytics - continue holding home  meds -Transition to Eliquis 5 mg twice daily dose to start 07/04/2020  Acute hypoxic respiratory failure -related to above, titrate oxygen as needed resolved - Ambulatory oxygen screen daily, wean as tolerated - Currently on room air  Ambulatory dysfunction, acute mechanical fall  Right trimalleolar ankle fracture with dorsal dislocation - Orthopedics following, reduced 06/28/2020 - ORIF 07/01/2020 tolerated procedure well; pain well controlled - Patient has known chronic ambulatory dysfunction, will continue PT/OT with close monitoring given high fall risk -currently being evaluated for discharge to CIR  AKI, resolved - Related to above, continue to advance diet, increase p.o. intake as tolerated Lab Results  Component Value Date   CREATININE 1.12 07/03/2020   CREATININE 1.16 07/01/2020   CREATININE 1.20 06/29/2020   Chronic opiate use - Resume oxycodone - Holding OxyContin for now - May require short acting narcotics postoperatively - currently doing well with nerve block  Uncontrolled diabetes -likely related to steroids that he recently received - Start Lantus 10 units - SSI resistant scale - A1C 6.7  DVT prophylaxis: Heparin drip --> transition to Eliquis tonight Code Status: Full Family Communication: Wife at bedside  Status is: Inpatient  Dispo: The patient is from: Home              Anticipated d/c is to: Likely CIR versus SNF pending ambulation progression              Anticipated d/c date is: 24 to 48 hours pending clinical course              Patient currently not  medically stable for discharge given ongoing need for IV anticoagulation in the setting of saddle PE, surgical procedure planned later today, disposition pending advancement of anticoagulation to p.o. as well as safe discharge location and ambulatory status.  Consultants:   PCCM, orthopedic surgery  Procedures:   TPA administration as above  Antimicrobials:  None  Subjective: No acute issues  or events overnight - pain currently well controlled, denies nausea, vomiting, diarrhea, constipation, headache, fevers, chills.  Objective: Vitals:   07/02/20 1258 07/02/20 1900 07/03/20 0500 07/03/20 0756  BP: (!) 142/86 (!) 142/76  127/74  Pulse: 96 86  72  Resp: 17 17  18   Temp: 98.9 F (37.2 C) (!) 97.5 F (36.4 C)  98.2 F (36.8 C)  TempSrc: Oral Oral  Oral  SpO2: 100% 99%  98%  Weight:   123.3 kg   Height:        Intake/Output Summary (Last 24 hours) at 07/03/2020 0815 Last data filed at 07/03/2020 0027 Gross per 24 hour  Intake 2201.7 ml  Output 3 ml  Net 2198.7 ml   Filed Weights   06/30/20 0531 07/01/20 0351 07/03/20 0500  Weight: 116.6 kg 116.6 kg 123.3 kg    Examination:  General:  Pleasantly resting in bed, No acute distress. HEENT:  Normocephalic atraumatic.  Sclerae nonicteric, noninjected.  Extraocular movements intact bilaterally. Neck:  Without mass or deformity.  Trachea is midline. Lungs:  Clear to auscultate bilaterally without rhonchi, wheeze, or rales. Heart:  Regular rate and rhythm.  Without murmurs, rubs, or gallops. Abdomen:  Soft, nontender, nondistended.  Without guarding or rebound. Extremities: Right ankle bandage clean dry intact, sensation intact distally Vascular:  Dorsalis pedis and posterior tibial pulses palpable bilaterally. Skin:  Warm and dry, no erythema, no ulcerations.   Data Reviewed: I have personally reviewed following labs and imaging studies  CBC: Recent Labs  Lab 06/26/20 2001 06/26/20 2006 06/29/20 0415 06/30/20 0206 07/01/20 0338 07/02/20 0126 07/03/20 0640  WBC 8.6   < > 7.4 7.0 6.1 6.5 9.8  NEUTROABS 4.5  --   --   --   --   --   --   HGB 12.2*   < > 10.7* 10.4* 10.4* 10.3* 9.8*  HCT 40.7   < > 32.5* 32.6* 31.5* 31.7* 30.3*  MCV 101.2*   < > 95.6 94.8 95.2 94.6 97.4  PLT 353   < > 304 337 373 344 366   < > = values in this interval not displayed.   Basic Metabolic Panel: Recent Labs  Lab 06/27/20 0101  06/27/20 0101 06/27/20 0419 06/28/20 0207 06/29/20 0415 07/01/20 0338 07/03/20 0640  NA 138   < > 141 139 137 139 137  K 4.3   < > 4.1 3.4* 3.4* 3.1* 4.3  CL 105  --   --  105 101 105 107  CO2 22  --   --  23 22 23  19*  GLUCOSE 204*  --   --  156* 154* 263* 121*  BUN 27*  --   --  19 11 13 16   CREATININE 1.95*  --   --  1.32* 1.20 1.16 1.12  CALCIUM 9.0  --   --  9.1 9.6 9.6 9.5  MG 2.3  --   --  2.0  --   --   --   PHOS 4.6  --   --  2.3*  --   --   --    < > = values in  this interval not displayed.   GFR: Estimated Creatinine Clearance: 86.8 mL/min (by C-G formula based on SCr of 1.12 mg/dL). Liver Function Tests: Recent Labs  Lab 06/26/20 2001 06/29/20 0415 07/01/20 0338 07/03/20 0640  AST 98* 59* 39 59*  ALT 56* 80* 53* 42  ALKPHOS 88 91 87 78  BILITOT 0.8 0.8 0.9 0.6  PROT 7.7 7.2 7.4 6.9  ALBUMIN 3.5 3.6 3.4* 3.2*   No results for input(s): LIPASE, AMYLASE in the last 168 hours. No results for input(s): AMMONIA in the last 168 hours. Coagulation Profile: Recent Labs  Lab 06/26/20 2001  INR 1.1   Cardiac Enzymes: No results for input(s): CKTOTAL, CKMB, CKMBINDEX, TROPONINI in the last 168 hours. BNP (last 3 results) No results for input(s): PROBNP in the last 8760 hours. HbA1C: No results for input(s): HGBA1C in the last 72 hours. CBG: Recent Labs  Lab 07/02/20 0642 07/02/20 1117 07/02/20 1623 07/02/20 2046 07/03/20 0652  GLUCAP 130* 112* 114* 112* 117*   Lipid Profile: No results for input(s): CHOL, HDL, LDLCALC, TRIG, CHOLHDL, LDLDIRECT in the last 72 hours. Thyroid Function Tests: No results for input(s): TSH, T4TOTAL, FREET4, T3FREE, THYROIDAB in the last 72 hours. Anemia Panel: No results for input(s): VITAMINB12, FOLATE, FERRITIN, TIBC, IRON, RETICCTPCT in the last 72 hours. Sepsis Labs: Recent Labs  Lab 06/26/20 2124  LATICACIDVEN 2.1*    Recent Results (from the past 240 hour(s))  SARS Coronavirus 2 by RT PCR (hospital order,  performed in [facility] hospital lab) Nasopharyngeal Nasopharyngeal Swab     Status: None   Collection Time: 06/26/20  9:10 PM   Specimen: Nasopharyngeal Swab  Result Value Ref Range Status   SARS Coronavirus 2 NEGATIVE NEGATIVE Final    Comment: (NOTE) SARS-CoV-2 target nucleic acids are NOT DETECTED.  The SARS-CoV-2 RNA is generally detectable in upper and lower respiratory specimens during the acute phase of infection. The lowest concentration of SARS-CoV-2 viral copies this assay can detect is 250 copies / mL. A negative result does not preclude SARS-CoV-2 infection and should not be used as the sole basis for treatment or other patient management decisions.  A negative result may occur with improper specimen collection / handling, submission of specimen other than nasopharyngeal swab, presence of viral mutation(s) within the areas targeted by this assay, and inadequate number of viral copies (<250 copies / mL). A negative result must be combined with clinical observations, patient history, and epidemiological information.  Fact Sheet for Patients:   StrictlyIdeas.no  Fact Sheet for Healthcare Providers: BankingDealers.co.za  This test is not yet approved or  cleared by the [place] FDA and has been authorized for detection and/or diagnosis of SARS-CoV-2 by FDA under an Emergency Use Authorization (EUA).  This EUA will remain in effect (meaning this test can be used) for the duration of the COVID-19 declaration under Section 564(b)(1) of the Act, 21 U.S.C. section 360bbb-3(b)(1), unless the authorization is terminated or revoked sooner.  Performed at [facility], [place] [address] [place], [place] [address]   Culture, blood (routine x 2)     Status: None   Collection Time: 06/26/20  9:33 PM   Specimen: BLOOD  Result Value Ref Range Status   Specimen Description BLOOD CENTRAL LINE  Final   Special Requests   Final      BOTTLES DRAWN AEROBIC AND ANAEROBIC Blood Culture adequate volume   Culture   Final    NO GROWTH 6 DAYS Performed at [facility]  Lab, [address] [place], [place] [address]    Report Status 07/02/2020 FINAL  Final  MRSA PCR Screening     Status: None   Collection Time: 06/26/20 11:30 PM   Specimen: Nasopharyngeal  Result Value Ref Range Status   MRSA by PCR NEGATIVE NEGATIVE Final    Comment:        The GeneXpert MRSA Assay (FDA approved for NASAL specimens only), is one component of a comprehensive MRSA colonization surveillance program. It is not intended to diagnose MRSA infection nor to guide or monitor treatment for MRSA infections. Performed at [facility], [place] [address] [place], [place] [address]          Radiology Studies: DG MINI C-ARM IMAGE ONLY  Result Date: 07/01/2020 There is no interpretation for this exam.  This order is for images obtained during a surgical procedure.  Please See "Surgeries" Tab for more information regarding the procedure.    Scheduled Meds: . DULoxetine  60 mg Oral Daily  . insulin aspart  0-5 Units Subcutaneous QHS  . insulin aspart  0-9 Units Subcutaneous TID WC  . insulin glargine  10 Units Subcutaneous QHS  . simvastatin  40 mg Oral QHS   Continuous Infusions: . 0.45 % NaCl with KCl 20 mEq / L 75 mL/hr at 07/02/20 0645  . heparin       LOS: 7 days   Time spent: 76min  Alassane Kalafut C Lochlyn Zullo, DO [facility]  If 7PM-7AM, please contact night-coverage [email or web address]  07/03/2020, 8:15 AM

## 2020-07-03 NOTE — Progress Notes (Signed)
[place] for Heparin Indication: pulmonary embolus  HEPARIN DW (KG): 104.2  Assessment: 49 YOM who presents with massive PE and saddle embolus and associated R heart strain. Patient to receive systemic thrombolysis with IV tPA. Pharmacy consulted to start IV heparin post tPA (tPA finished ~0100 9/20). He is noted with an ankle fracture and plans noted for OR on 9/24.   Patient is s/p ORIF ankle fracture - pharmacy consulted to resume IV heparin. AET 14:24. Heparin level came back supratherapeutic at 0.81 on 1700 units/hr. Confirmed with nursing that level was drawn from left arm while heparin infusing in right arm. No s/sx of bleeding or infusion issues. Will reduce rate to 1500 units/hr and recheck heparin level in 6 hours. Plan is to transition to [facility] either this evening or tomorrow per ortho and primary.  Goal of Therapy:  Heparin level = 0.3 - 0.7 units/mL  Monitor platelets by anticoagulation protocol: Yes  Plan Decrease heparin infusion to 1500 units/hr Check heparin level at 1400 Monitor daily HL, CBC, and for s/sx of bleeding  Alfonse Spruce, PharmD PGY2 ID Pharmacy Resident Phone between 7 am - 3:30 pm: [phone]  Please check AMION for all [place] phone numbers After 10:00 PM, call [place] [phone]

## 2020-07-04 ENCOUNTER — Encounter (HOSPITAL_COMMUNITY): Payer: Self-pay | Admitting: Physical Medicine & Rehabilitation

## 2020-07-04 ENCOUNTER — Inpatient Hospital Stay (HOSPITAL_COMMUNITY)
Admission: RE | Admit: 2020-07-04 | Discharge: 2020-07-20 | DRG: 945 | Disposition: A | Discharge status: Home / Self Care | Payer: Medicare Other | Source: Intra-hospital | Attending: Physical Medicine & Rehabilitation | Admitting: Physical Medicine & Rehabilitation

## 2020-07-04 ENCOUNTER — Other Ambulatory Visit: Payer: Self-pay

## 2020-07-04 DIAGNOSIS — Z86711 Personal history of pulmonary embolism: Secondary | ICD-10-CM | POA: Diagnosis not present

## 2020-07-04 DIAGNOSIS — D62 Acute posthemorrhagic anemia: Secondary | ICD-10-CM | POA: Diagnosis present

## 2020-07-04 DIAGNOSIS — S82891A Other fracture of right lower leg, initial encounter for closed fracture: Secondary | ICD-10-CM

## 2020-07-04 DIAGNOSIS — E785 Hyperlipidemia, unspecified: Secondary | ICD-10-CM | POA: Diagnosis present

## 2020-07-04 DIAGNOSIS — Z87891 Personal history of nicotine dependence: Secondary | ICD-10-CM

## 2020-07-04 DIAGNOSIS — Z823 Family history of stroke: Secondary | ICD-10-CM

## 2020-07-04 DIAGNOSIS — R5381 Other malaise: Secondary | ICD-10-CM

## 2020-07-04 DIAGNOSIS — R7309 Other abnormal glucose: Secondary | ICD-10-CM

## 2020-07-04 DIAGNOSIS — Z79899 Other long term (current) drug therapy: Secondary | ICD-10-CM

## 2020-07-04 DIAGNOSIS — G894 Chronic pain syndrome: Secondary | ICD-10-CM | POA: Diagnosis present

## 2020-07-04 DIAGNOSIS — Z86718 Personal history of other venous thrombosis and embolism: Secondary | ICD-10-CM

## 2020-07-04 DIAGNOSIS — I2699 Other pulmonary embolism without acute cor pulmonale: Secondary | ICD-10-CM | POA: Diagnosis not present

## 2020-07-04 DIAGNOSIS — E876 Hypokalemia: Secondary | ICD-10-CM

## 2020-07-04 DIAGNOSIS — Z825 Family history of asthma and other chronic lower respiratory diseases: Secondary | ICD-10-CM

## 2020-07-04 DIAGNOSIS — E279 Disorder of adrenal gland, unspecified: Secondary | ICD-10-CM | POA: Diagnosis present

## 2020-07-04 DIAGNOSIS — Z7984 Long term (current) use of oral hypoglycemic drugs: Secondary | ICD-10-CM | POA: Diagnosis not present

## 2020-07-04 DIAGNOSIS — E871 Hypo-osmolality and hyponatremia: Secondary | ICD-10-CM

## 2020-07-04 DIAGNOSIS — I10 Essential (primary) hypertension: Secondary | ICD-10-CM | POA: Diagnosis present

## 2020-07-04 DIAGNOSIS — Z7901 Long term (current) use of anticoagulants: Secondary | ICD-10-CM | POA: Diagnosis not present

## 2020-07-04 DIAGNOSIS — E1142 Type 2 diabetes mellitus with diabetic polyneuropathy: Secondary | ICD-10-CM | POA: Diagnosis present

## 2020-07-04 DIAGNOSIS — W010XXD Fall on same level from slipping, tripping and stumbling without subsequent striking against object, subsequent encounter: Secondary | ICD-10-CM | POA: Diagnosis present

## 2020-07-04 DIAGNOSIS — M4804 Spinal stenosis, thoracic region: Secondary | ICD-10-CM | POA: Diagnosis present

## 2020-07-04 DIAGNOSIS — S82891D Other fracture of right lower leg, subsequent encounter for closed fracture with routine healing: Secondary | ICD-10-CM | POA: Diagnosis not present

## 2020-07-04 DIAGNOSIS — S82851D Displaced trimalleolar fracture of right lower leg, subsequent encounter for closed fracture with routine healing: Secondary | ICD-10-CM | POA: Diagnosis not present

## 2020-07-04 DIAGNOSIS — Z833 Family history of diabetes mellitus: Secondary | ICD-10-CM | POA: Diagnosis not present

## 2020-07-04 DIAGNOSIS — K5903 Drug induced constipation: Secondary | ICD-10-CM | POA: Diagnosis present

## 2020-07-04 DIAGNOSIS — Z8249 Family history of ischemic heart disease and other diseases of the circulatory system: Secondary | ICD-10-CM | POA: Diagnosis not present

## 2020-07-04 DIAGNOSIS — G8918 Other acute postprocedural pain: Secondary | ICD-10-CM

## 2020-07-04 LAB — GLUCOSE, CAPILLARY
Glucose-Capillary: 101 mg/dL — ABNORMAL HIGH (ref 70–99)
Glucose-Capillary: 92 mg/dL (ref 70–99)
Glucose-Capillary: 99 mg/dL (ref 70–99)
Glucose-Capillary: 132 mg/dL — ABNORMAL HIGH (ref 70–99)

## 2020-07-04 MED ORDER — BISACODYL 10 MG RE SUPP: 10.0000 mg | Freq: Every day | RECTAL | Status: DC | PRN

## 2020-07-04 MED ORDER — INSULIN GLARGINE 100 UNIT/ML ~~LOC~~ SOLN
10.0000 [IU] | Freq: Every day | SUBCUTANEOUS | Status: DC
  Administered 2020-07-04 – 2020-07-19 (×16): 10 [IU] via SUBCUTANEOUS
  Filled 2020-07-04 (×17): qty 0.1

## 2020-07-04 MED ORDER — APIXABAN 5 MG PO TABS
5.0000 mg | ORAL_TABLET | Freq: Two times a day (BID) | ORAL | Status: DC
Start: 2020-07-04 — End: 2020-07-19

## 2020-07-04 MED ORDER — OXYCODONE HCL 5 MG PO TABS
5.0000 mg | ORAL_TABLET | Freq: Four times a day (QID) | ORAL | Status: DC | PRN
  Administered 2020-07-04 – 2020-07-05 (×3): 5 mg via ORAL
  Filled 2020-07-04 (×3): qty 1

## 2020-07-04 MED ORDER — SIMVASTATIN 20 MG PO TABS
40.0000 mg | ORAL_TABLET | Freq: Every day | ORAL | Status: DC
  Administered 2020-07-04 – 2020-07-19 (×16): 40 mg via ORAL
  Filled 2020-07-04 (×16): qty 2

## 2020-07-04 MED ORDER — APIXABAN 5 MG PO TABS
5.0000 mg | ORAL_TABLET | Freq: Two times a day (BID) | ORAL | Status: DC
  Administered 2020-07-04 – 2020-07-20 (×32): 5 mg via ORAL
  Filled 2020-07-04 (×32): qty 1

## 2020-07-04 MED ORDER — POLYETHYLENE GLYCOL 3350 17 G PO PACK
17.0000 g | PACK | Freq: Every day | ORAL | Status: DC | PRN
  Administered 2020-07-05: 17 g via ORAL
  Filled 2020-07-04: qty 1

## 2020-07-04 MED ORDER — INSULIN ASPART 100 UNIT/ML ~~LOC~~ SOLN
0.0000 [IU] | Freq: Three times a day (TID) | SUBCUTANEOUS | Status: DC
  Administered 2020-07-05: 1 [IU] via SUBCUTANEOUS
  Administered 2020-07-06: 2 [IU] via SUBCUTANEOUS
  Administered 2020-07-06 – 2020-07-08 (×6): 1 [IU] via SUBCUTANEOUS
  Administered 2020-07-09: 2 [IU] via SUBCUTANEOUS
  Administered 2020-07-09 (×2): 1 [IU] via SUBCUTANEOUS
  Administered 2020-07-10: 3 [IU] via SUBCUTANEOUS
  Administered 2020-07-10 – 2020-07-20 (×13): 1 [IU] via SUBCUTANEOUS

## 2020-07-04 MED ORDER — ONDANSETRON HCL 4 MG/2ML IJ SOLN: 4.0000 mg | Freq: Four times a day (QID) | INTRAMUSCULAR | Status: DC | PRN

## 2020-07-04 MED ORDER — ONDANSETRON HCL 4 MG PO TABS: 4.0000 mg | ORAL_TABLET | Freq: Four times a day (QID) | ORAL | Status: DC | PRN

## 2020-07-04 MED ORDER — DOCUSATE SODIUM 100 MG PO CAPS: 100.0000 mg | ORAL_CAPSULE | Freq: Two times a day (BID) | ORAL | Status: DC | PRN

## 2020-07-04 MED ORDER — DULOXETINE HCL 60 MG PO CPEP
60.0000 mg | ORAL_CAPSULE | Freq: Every day | ORAL | Status: DC
  Administered 2020-07-05 – 2020-07-20 (×16): 60 mg via ORAL
  Filled 2020-07-04 (×16): qty 1

## 2020-07-04 MED ORDER — SORBITOL 70 % SOLN: 30.0000 mL | Freq: Every day | Status: DC | PRN

## 2020-07-04 NOTE — H&P (Signed)
Physical Medicine and Rehabilitation Admission H&P    Chief Complaint  Patient presents with  . Code STEMI  : HPI: Keith [name] is a 69 year old right-handed male with history of chronic back pain status post T11-T12 decompression 07/28/2018 maintained on OxyContin 10 mg 3 times daily as well as oxycodone 1 to 2 tablets every 6 hours as needed, hypertension, tobacco abuse, diabetes mellitus.  Patient with recent admission 3 weeks ago for acute on chronic back pain seen by neurosurgery Dr. Trenton Gammon.  MRI thoracic lumbar spine showed T11-12 cord compression with myelopathic findings despite an interval laminectomy.  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  He was placed on IV steroids with good improvement ambulating 250 feet rolling walker and was to follow-up outpatient with Dr. Venetia Constable.  Per chart review patient lives with spouse.  Two-level home 2 steps to entry.  Independent with assistive device.  Presented 06/26/2020 after reported fall related near syncopal event when he slipped on a wet floor without loss of consciousness sustaining right ankle trimalleolar fracture dorsal subluxation of the talus relative to the tibial plafond.  Small avulsion fracture dorsal distal margin of the talus.  Admission chemistries glucose 328 creatinine 2.0 BUN 24, troponin 45 hemoglobin 12.2, alcohol negative, lactic acid 2.1.  Patient also with complaints of chest pain noted to be hypoxic saturation in the 80s blood pressure in the 50s.Marland Kitchen  He was placed on nonrebreather mask as well as started on Levophed.  CT angiogram of the chest showed extensive bilateral pulmonary emboli with saddle embolus and associated right heart strain.  2.2 cm x 1.9 cm low-attenuation left adrenal mass.  Lower extremity Dopplers acute DVT involving the left popliteal vein left posterior tibial vein and left peroneal vein.  Patient was started on intravenous heparin echocardiogram showed ejection fraction  of 60 to 65% no wall motion abnormalities grade 1 diastolic dysfunction.  Elevated troponin felt to be related to demand ischemia.  Orthopedic service follow-up Dr. Mardelle Matte in regards to right bimalleolar ankle fracture patient was cleared to undergo ORIF with fixation of fibula 07/01/2020.  He is nonweightbearing for 6 to 8 weeks..  Patient's IV heparin was transitioned to Eliquis after ankle surgery for pulmonary emboli.  Review of Systems  Constitutional: Negative for chills and fever.  HENT: Negative for hearing loss.   Eyes: Negative for blurred vision, double vision and photophobia.  Respiratory: Positive for shortness of breath.   Cardiovascular: Positive for chest pain and leg swelling. Negative for palpitations.  Gastrointestinal: Positive for constipation. Negative for heartburn and nausea.       GERD  Genitourinary: Negative for dysuria, flank pain and hematuria.  Musculoskeletal: Positive for back pain, falls and myalgias.  Skin: Negative for rash.  All other systems reviewed and are negative.  Past Medical History:  Diagnosis Date  . Allergy   . Chronic back pain   . Diabetes mellitus without complication ([place])   . Duodenal ulcer hemorrhage 08/29/2014  . ED (erectile dysfunction)   . Esophageal stricture 08/30/2014  . Glucose intolerance (impaired glucose tolerance)   . Hiatal hernia 08/30/2014  . Hypertension   . Hypokalemia 04/11/2013  . 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 Obesity   . Osteoarthritis   . Scoliosis 2016  . Spinal stenosis of lumbar region   . Urinary tract infection 04/11/2013   Past Surgical History:  Procedure Laterality Date  . COLONOSCOPY  2008  . ESOPHAGOGASTRODUODENOSCOPY N/A 08/29/2014   Procedure: ESOPHAGOGASTRODUODENOSCOPY (EGD);  Surgeon: Lafayette Dragon, MD;  Location: [facility];  Service: Endoscopy;  Laterality: N/A;  . LAMINECTOMY N/A 07/28/2018   Procedure: THORACIC ELEVEN- THORACIC TWELVE POSTERIOR DECOMPRESSION LAMINECTOMY;  Surgeon:  Judith Part, MD;  Location: [facility];  Service: Neurosurgery;  Laterality: N/A;  . LUMBAR LAMINECTOMY    . NASAL POLYP SURGERY    . TONSILLECTOMY     Family History  Problem Relation Age of Onset  . Diabetes Mother   . Asthma Mother   . Hypertension Father   . Stroke Father   . Colon cancer Neg Hx    Social History:  reports that he has quit smoking. He has a 25.00 pack-year smoking history. He has never used smokeless tobacco. He reports previous alcohol use. He reports that he does not use drugs. Allergies:  Allergies  Allergen Reactions  . Lisinopril Other (See Comments)    Caused a body ache   Medications Prior to Admission  Medication Sig Dispense Refill  . atenolol-chlorthalidone (TENORETIC) 50-25 MG tablet Take 1 tablet by mouth daily. Pt needs appt for further refills. (Patient taking differently: Take 1 tablet by mouth daily. ) 90 tablet 2  . cloNIDine (CATAPRES) 0.1 MG tablet Take 1 tablet (0.1 mg total) by mouth daily. 90 tablet 2  . cyclobenzaprine (FLEXERIL) 10 MG tablet Take 1 tablet (10 mg total) by mouth 3 (three) times daily as needed for muscle spasms. 30 tablet 0  . docusate sodium (COLACE) 100 MG capsule Take 1 capsule (100 mg total) by mouth 2 (two) times daily. 10 capsule 0  . DULoxetine (CYMBALTA) 30 MG capsule Take 60 mg by mouth daily.    . metFORMIN (GLUCOPHAGE) 500 MG tablet Take 0.5 tablets (250 mg total) by mouth daily with breakfast. 45 tablet 2  . Multiple Vitamin (MULITIVITAMIN WITH MINERALS) TABS Take 1 tablet by mouth daily.    Marland Kitchen oxyCODONE (OXY IR/ROXICODONE) 5 MG immediate release tablet Take 1-2 tablets (5-10 mg total) by mouth every 6 (six) hours as needed for severe pain ((score 7 to 10)). (Patient taking differently: Take 5-10 mg by mouth every 6 (six) hours as needed (for severe, "breakthrough pain" with a score/level of 7-10). ) 40 tablet 0  . OXYCONTIN 10 MG 12 hr tablet Take 10 mg by mouth 3 (three) times daily.     . simvastatin (ZOCOR)  40 MG tablet Take 1 tablet (40 mg total) by mouth daily. (Patient taking differently: Take 40 mg by mouth at bedtime. ) 90 tablet 2  . sildenafil (REVATIO) 20 MG tablet TAKE 1-2 TABLETS 2-3 HOURS PRIOR TO SEX (Patient not taking: Reported on 06/26/2020) 30 tablet 0    Drug Regimen Review Drug regimen was reviewed and remains appropriate with no significant issues identified  Home: Home Living Family/patient expects to be discharged to:: Private residence Living Arrangements: Spouse/significant other Available Help at Discharge: Family, Available 24 hours/day Type of Home: House Home Access: Stairs to enter [employer] of Steps: 2 Entrance Stairs-Rails: Right Home Layout: Two level Alternate Level Stairs-Number of Steps: 13 Alternate Level Stairs-Rails: Right Bathroom Shower/Tub: Multimedia programmer: Handicapped height Bathroom Accessibility: Yes Home Equipment: Environmental consultant - 4 wheels, Cane - single point, Grab bars - toilet, Grab bars - tub/shower, Bedside commode, Adaptive equipment, Wheelchair - manual, Chief Technology Officer: Reacher, Sock aid, Long-handled shoe horn, Long-handled sponge   Functional History: Prior Function Level of Independence: Independent with assistive device(s) Comments: rollator  Functional Status:  Mobility: Bed Mobility Overal bed  mobility: Needs Assistance Bed Mobility: Supine to Sit Rolling: Modified independent (Device/Increase time) Sidelying to sit: Min guard Supine to sit: Supervision Sit to supine: Min assist, HOB elevated General bed mobility comments: Close supervision, use of bed rail, increased time/effort and use of momentum Transfers Overall transfer level: Needs assistance Equipment used: Rolling walker (2 wheeled) Transfers: Sit to/from Stand, [employer] Transfers Sit to Stand: Min assist, +2 physical assistance Stand pivot transfers: Mod assist, +2 physical assistance General transfer comment: MinA + 2  to power up into standing; cues for hand/right foot placement. ModA + 2 for pivot towards left to chair. Cues for sequencing/direction. Good maintenance of weightbearing precautions Ambulation/Gait General Gait Details: Unable to take complete hop on L foot, reliant on scooting L foot to pivot to recliner    ADL: ADL Overall ADL's : Needs assistance/impaired Eating/Feeding: Independent Grooming: Wash/dry hands, Wash/dry face, Oral care, Set up, Sitting Grooming Details (indicate cue type and reason): EOB Upper Body Bathing: Set up, Sitting Upper Body Bathing Details (indicate cue type and reason): EOB Lower Body Bathing: Moderate assistance, Sitting/lateral leans Upper Body Dressing : Set up, Sitting Lower Body Dressing: Moderate assistance, Sitting/lateral leans Functional mobility during ADLs:  (Pt. did  not want to get OOB today) General ADL Comments: ADLs sitting EOB  Cognition: Cognition Overall Cognitive Status: Within Functional Limits for tasks assessed Orientation Level: Oriented X4 Cognition Arousal/Alertness: Awake/alert Behavior During Therapy: WFL for tasks assessed/performed Overall Cognitive Status: Within Functional Limits for tasks assessed  Physical Exam: Blood pressure 140/78, pulse 86, temperature 99.3 F (37.4 C), temperature source Oral, resp. rate 17, height 6' (1.829 m), weight 123.3 kg, SpO2 100 %. Physical Exam General: Alert and oriented x 3, No apparent distress HEENT: Head is normocephalic, atraumatic, PERRLA, EOMI, sclera anicteric, oral mucosa pink and moist, dentition intact, ext ear canals clear,  Neck: Supple without JVD or lymphadenopathy Heart: Reg rate and rhythm. No murmurs rubs or gallops Chest: CTA bilaterally without wheezes, rales, or rhonchi; no distress Abdomen: Soft, non-tender, non-distended, bowel sounds positive. Extremities: No clubbing, cyanosis, or edema. Pulses are 2+ Skin: Right lower extremity with soft splint in place.   Neurovascular sensation intact.  Neuro: Pt is cognitively appropriate with normal insight, memory, and awareness. Cranial nerves 2-12 are intact. Sensory exam is normal. Reflexes are 2+ in all 4's. Fine motor coordination is intact. No tremors. Patient is alert in no acute distress oriented x3 and follows full commands. Can wiggle toes in right foot Psych: Pt's affect is appropriate. Pt is cooperative     Results for orders placed or performed during the hospital encounter of 06/26/20 (from the past 48 hour(s))  Glucose, capillary     Status: Abnormal   Collection Time: 07/02/20 11:17 AM  Result Value Ref Range   Glucose-Capillary 112 (H) 70 - 99 mg/dL    Comment: Glucose reference range applies only to samples taken after fasting for at least 8 hours.  Heparin level (unfractionated)     Status: Abnormal   Collection Time: 07/02/20 11:37 AM  Result Value Ref Range   Heparin Unfractionated 0.75 (H) 0.30 - 0.70 IU/mL    Comment: (NOTE) If heparin results are below expected values, and patient dosage has  been confirmed, suggest follow up testing of antithrombin III levels. Performed at [facility], [place] [address], [place],  [address]   Glucose, capillary     Status: Abnormal   Collection Time: 07/02/20  4:23 PM  Result Value Ref Range  Glucose-Capillary 114 (H) 70 - 99 mg/dL    Comment: Glucose reference range applies only to samples taken after fasting for at least 8 hours.  Glucose, capillary     Status: Abnormal   Collection Time: 07/02/20  8:46 PM  Result Value Ref Range   Glucose-Capillary 112 (H) 70 - 99 mg/dL    Comment: Glucose reference range applies only to samples taken after fasting for at least 8 hours.  Heparin level (unfractionated)     Status: None   Collection Time: 07/02/20  8:55 PM  Result Value Ref Range   Heparin Unfractionated 0.57 0.30 - 0.70 IU/mL    Comment: (NOTE) If heparin results are below expected values, and patient dosage has  been  confirmed, suggest follow up testing of antithrombin III levels. Performed at [facility], [place] [address], [place], [place] [address]   Comprehensive metabolic panel     Status: Abnormal   Collection Time: 07/03/20  6:40 AM  Result Value Ref Range   Sodium 137 135 - 145 mmol/L   Potassium 4.3 3.5 - 5.1 mmol/L   Chloride 107 98 - 111 mmol/L   CO2 19 (L) 22 - 32 mmol/L   Glucose, Bld 121 (H) 70 - 99 mg/dL    Comment: Glucose reference range applies only to samples taken after fasting for at least 8 hours.   BUN 16 8 - 23 mg/dL   Creatinine, Ser 1.12 0.61 - 1.24 mg/dL   Calcium 9.5 8.9 - 10.3 mg/dL   Total Protein 6.9 6.5 - 8.1 g/dL   Albumin 3.2 (L) 3.5 - 5.0 g/dL   AST 59 (H) 15 - 41 U/L   ALT 42 0 - 44 U/L   Alkaline Phosphatase 78 38 - 126 U/L   Total Bilirubin 0.6 0.3 - 1.2 mg/dL   GFR calc non Af Amer >60 >60 mL/min   GFR calc Af Amer >60 >60 mL/min   Anion gap 11 5 - 15    Comment: Performed at [facility], [place] [address], [place], [place] [address]  Heparin level (unfractionated)     Status: Abnormal   Collection Time: 07/03/20  6:40 AM  Result Value Ref Range   Heparin Unfractionated 0.81 (H) 0.30 - 0.70 IU/mL    Comment: (NOTE) If heparin results are below expected values, and patient dosage has  been confirmed, suggest follow up testing of antithrombin III levels. Performed at [facility], [place] [address], [place], [place] [address]   CBC     Status: Abnormal   Collection Time: 07/03/20  6:40 AM  Result Value Ref Range   WBC 9.8 4.0 - 10.5 K/uL   RBC 3.11 (L) 4.22 - 5.81 MIL/uL   Hemoglobin 9.8 (L) 13.0 - 17.0 g/dL   HCT 30.3 (L) 39 - 52 %   MCV 97.4 80.0 - 100.0 fL   MCH 31.5 26.0 - 34.0 pg   MCHC 32.3 30.0 - 36.0 g/dL   RDW 15.3 11.5 - 15.5 %   Platelets 366 150 - 400 K/uL   nRBC 0.0 0.0 - 0.2 %    Comment: Performed at [facility], [place] [address] [place], [place] [address]  Glucose, capillary     Status: Abnormal   Collection  Time: 07/03/20  6:52 AM  Result Value Ref Range   Glucose-Capillary 117 (H) 70 - 99 mg/dL    Comment: Glucose reference range applies only to samples taken after fasting for at  least 8 hours.  Glucose, capillary     Status: Abnormal   Collection Time: 07/03/20 12:07 PM  Result Value Ref Range   Glucose-Capillary 111 (H) 70 - 99 mg/dL    Comment: Glucose reference range applies only to samples taken after fasting for at least 8 hours.  Glucose, capillary     Status: Abnormal   Collection Time: 07/03/20  5:08 PM  Result Value Ref Range   Glucose-Capillary 102 (H) 70 - 99 mg/dL    Comment: Glucose reference range applies only to samples taken after fasting for at least 8 hours.  Glucose, capillary     Status: Abnormal   Collection Time: 07/03/20  8:43 PM  Result Value Ref Range   Glucose-Capillary 153 (H) 70 - 99 mg/dL    Comment: Glucose reference range applies only to samples taken after fasting for at least 8 hours.  Glucose, capillary     Status: Abnormal   Collection Time: 07/04/20  6:45 AM  Result Value Ref Range   Glucose-Capillary 101 (H) 70 - 99 mg/dL    Comment: Glucose reference range applies only to samples taken after fasting for at least 8 hours.   No results found.     Medical Problem List and Plan: 1.  Decreased functional mobility secondary to right bimalleolar ankle fracture status post ORIF 3/38/2505 complicated by pulmonary emboli.  Nonweightbearing x6 to 8 weeks  -patient may not shower  -ELOS/Goals: modI in 16-18 days 2.  Antithrombotics: -DVT/anticoagulation: Eliquis  -antiplatelet therapy: N/A 3. Pain Management/Chronic Back pain: Cymbalta 60 mg daily, oxycodone as needed.  Patient had been on oxycodone 5 to 10 mg every 6 hours as needed prior to admission as well as Flexeril and OxyContin 10 mg 3 times daily. Well controlled with medication.  Patient is to follow-up outpatient with Dr. Venetia Constable neurosurgery 4. Mood: Provide emotional  support  -antipsychotic agents: N/A 5. Neuropsych: This patient is capable of making decisions on his own behalf. 6. Skin/Wound Care: Routine skin checks 7. Fluids/Electrolytes/Nutrition: Routine in and outs with follow-up chemistries 8.  Acute blood loss anemia.  Follow-up CBC. Hgb 9.8 9/26 9.  Diabetes mellitus peripheral neuropathy.  Hemoglobin A1c 6.7.  Currently on Lantus insulin 10 units nightly.  Patient on Glucophage 250 mg daily prior to admission.  Resume as needed. Most reads slightly elevated. Educated regarding limiting sugar.  10.  Hyperlipidemia.  Zocor 11.  AKI.  Resolved.  Follow-up chemistries 12.  Incidental findings left adrenal mass.  Follow-up outpatient  Cathlyn Parsons, PA-C 07/04/2020   I have personally performed a face to face diagnostic evaluation, including, but not limited to relevant history and physical exam findings, of this patient and developed relevant assessment and plan.  Additionally, I have reviewed and concur with the physician assistant's documentation above.  Leeroy Cha, MD

## 2020-07-04 NOTE — Plan of Care (Signed)

## 2020-07-04 NOTE — IPOC Note (Addendum)
Individualized overall Plan of Care (IPOC) Patient Details Name: Keith [name] MRN: [id number] DOB: Sep 14, 1951  Admitting Diagnosis: Debility  Hospital Problems: Principal Problem:   Debility Active Problems:   Pulmonary emboli (HCC)   Hyponatremia   Diabetic peripheral neuropathy (HCC)   Acute blood loss anemia   Chronic pain syndrome   Postoperative pain     Functional Problem List: Nursing Motor, Pain, Safety  PT Balance, Endurance, Motor, Pain  OT Balance, Endurance, Motor, Pain  SLP    TR         Basic ADL's: OT Grooming, Bathing, Dressing, Toileting     Advanced  ADL's: OT Simple Meal Preparation     Transfers: PT Bed Mobility, Bed to Chair, Car, Manufacturing systems engineer, Metallurgist: PT Ambulation, Emergency planning/management officer, Stairs     Additional Impairments: OT None  SLP        TR      Anticipated Outcomes Item Anticipated Outcome  Self Feeding    Swallowing      Basic self-care  Supervision bathing, Mod I dressing  Toileting  Supervision   Bathroom Transfers Supervision  Bowel/Bladder  maintain regular pattern of emptying bowel and bladder, remain continent  Transfers  supervision with LRAD  Locomotion  supervision with LRAD  Communication     Cognition     Pain  less than 4  Safety/Judgment  Remain free of falls, skin breakdown and infection   Therapy Plan: PT Intensity: Minimum of 1-2 x/day ,45 to 90 minutes PT Frequency: 5 out of 7 days PT Duration Estimated Length of Stay: 2-2.5 weeks OT Intensity: Minimum of 1-2 x/day, 45 to 90 minutes OT Frequency: 5 out of 7 days OT Duration/Estimated Length of Stay: 14-18 days      Team Interventions: Nursing Interventions Patient/Family Education, Pain Management  PT interventions Ambulation/gait training, Discharge planning, Functional mobility training, Therapeutic Activities, Balance/vestibular training, Disease management/prevention, Neuromuscular re-education, Skin  care/wound management, Therapeutic Exercise, Wheelchair propulsion/positioning, DME/adaptive equipment instruction, Pain management, Splinting/orthotics, UE/LE Strength taining/ROM, Academic librarian, Technical sales engineer stimulation, Barrister's clerk education, IT trainer, UE/LE Coordination activities  OT Interventions Training and development officer, Academic librarian, Discharge planning, Disease mangement/prevention, Engineer, drilling, Functional mobility training, Pain management, Patient/family education, Psychosocial support, Self Care/advanced ADL retraining, Splinting/orthotics, Skin care/wound managment, Therapeutic Activities, Therapeutic Exercise, UE/LE Strength taining/ROM, Wheelchair propulsion/positioning  SLP Interventions    TR Interventions    SW/CM Interventions Discharge Planning, Psychosocial Support, Patient/Family Education   Barriers to Discharge MD  Medical stability and Weight bearing restrictions  Nursing      PT Inaccessible home environment, Home environment access/layout, Weight bearing restrictions 3 STE with R handrail, flight of steps to bedroom with R handrail, NWB R LE  OT Home environment access/layout, Weight bearing restrictions bedroom and full bath upstairs  SLP      SW       Team Discharge Planning: Destination: PT-Home ,OT- Home , SLP-  Projected Follow-up: PT-Home health PT, OT-  Home health OT, 24 hour supervision/assistance, SLP-  Projected Equipment Needs: PT-To be determined, OT- 3 in 1 bedside comode, Tub/shower seat, SLP-  Equipment Details: PT-pt has rollator and cane, OT-  Patient/family involved in discharge planning: PT- Patient,  OT-Patient, SLP-   MD ELOS: 12-16 days. Medical Rehab Prognosis:  Excellent Assessment: 69 year old right-handed male with history of chronic back pain status post T11-T12 decompression 07/28/2018 maintained on OxyContin 10 mg 3 times daily as well as oxycodone 1 to 2 tablets every  6  hours as needed, hypertension, tobacco abuse, diabetes mellitus.  Patient with recent admission 3 weeks ago for acute on chronic back pain seen by neurosurgery Dr. Trenton Gammon.  MRI thoracic lumbar spine showed T11-12 cord compression with myelopathic findings despite an interval laminectomy.  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  He was placed on IV steroids with good improvement ambulating 250 feet rolling walker and was to follow-up outpatient with Dr. Venetia Constable.  Presented 06/26/2020 after reported fall related near syncopal event when he slipped on a wet floor without loss of consciousness sustaining right ankle trimalleolar fracture dorsal subluxation of the talus relative to the tibial plafond.  Small avulsion fracture dorsal distal margin of the talus.  Admission chemistries glucose 328 creatinine 2.0 BUN 24, troponin 45 hemoglobin 12.2, alcohol negative, lactic acid 2.1.  Patient also with complaints of chest pain noted to be hypoxic saturation in the 80s blood pressure in the 50s.Marland Kitchen  He was placed on nonrebreather mask as well as started on Levophed.  CT angiogram of the chest showed extensive bilateral pulmonary emboli with saddle embolus and associated right heart strain.  2.2 cm x 1.9 cm low-attenuation left adrenal mass.  Lower extremity Dopplers acute DVT involving the left popliteal vein left posterior tibial vein and left peroneal vein.  Patient was started on intravenous heparin echocardiogram showed ejection fraction of 60 to 65% no wall motion abnormalities grade 1 diastolic dysfunction.  Elevated troponin felt to be related to demand ischemia.  Orthopedic service follow-up Dr. Mardelle Matte in regards to right bimalleolar ankle fracture patient was cleared to undergo ORIF with fixation of fibula 07/01/2020.  He is nonweightbearing for 6 to 8 weeks..  Patient's IV heparin was transitioned to Eliquis after ankle surgery for pulmonary emboli. Patient with resulting functional  deficits with mobility, transfers, endurance, self-care.  Will set goals for Supervision with PT/OT.  Due to the current state of emergency, patients may not be receiving their 3-hours of Medicare-mandated therapy.  See Team Conference Notes for weekly updates to the plan of care

## 2020-07-04 NOTE — Progress Notes (Addendum)
Inpatient Rehab Admissions:  Inpatient Rehab Consult received.  I met with patient at the bedside for rehabilitation assessment and to discuss goals and expectations of an inpatient rehab admission.  He is agreeable to CIR stay to maximize independence prior to returning home with his wife.  Pt has ambitious goals of being able to go on his cruise (was cancelled last year 2/2 covid) in mid/late October.  Per pt, wife and him are both retired and wife can provide any needed assist at discharge.  Dr. Avon Gully in agreement. Will let TOC team and pt/family know.   Signed: Shann Medal, PT, DPT Admissions Coordinator [phone] 07/04/20  11:57 AM

## 2020-07-04 NOTE — PMR Pre-admission (Signed)
PMR Admission Coordinator Pre-Admission Assessment  Patient: Keith [name] is an 69 y.o., male MRN: [id number] DOB: 1951/02/21 Height: 6' (182.9 cm) Weight: 123.3 kg  Insurance Information HMO:     PPO:      PCP:      IPA:      80/20:      OTHER:  PRIMARY: Medicare A and B      Policy#: [id number]      Subscriber: pt CM Name:       Phone#:      Fax#:  Pre-Cert#: verified Civil engineer, contracting:  Benefits:  Phone #:      Name:  Eff. Date: A 09/07/17, B 07/09/19     Deduct: $1484      Out of Pocket Max: n/a      Life Max: n/a CIR: 100%      SNF: 20 full days Outpatient: 80%     Co-Pay: 20% Home Health: 100%      Co-Pay:  DME: 80%     Co-Pay: 20% Providers:  SECONDARY:       Policy#:      Phone#:   Development worker, community:       Phone#:   The "Data Collection Information Summary" for patients in Inpatient Rehabilitation Facilities with attached "Privacy Act [place] Records" was provided and verbally reviewed with: Patient  Emergency Contact Information Contact Information    Name Relation Home Work Mobile   Keith [name] Spouse [phone]  [phone]      Current Medical History  Patient Admitting Diagnosis: saddle PE and R trimalleolar fracture History of Present Illness:  Keith [name] is a 69 year old right-handed male with history of chronic back pain, status post T11-T12 decompression 07/28/2018, maintained on OxyContin 10 mg 3 times daily, as well as oxycodone 1 to 2 tablets every 6 hours as needed, hypertension, tobacco abuse, diabetes mellitus.  Patient with recent admission 3 weeks ago for acute on chronic back pain seen by neurosurgery Dr. Trenton Gammon.  MRI thoracic lumbar spine showed T11-12 cord compression with myelopathic findings despite an interval laminectomy.  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  He was placed on IV steroids with good improvement ambulating 250 feet rolling walker and was to follow-up outpatient with  Dr. Venetia Constable.  Presented 06/26/2020 after reported fall related near syncopal event coming out of the bathroom. Admission chemistries glucose 328 creatinine 2.0 BUN 24, troponin 45 hemoglobin 12.2, alcohol negative, lactic acid 2.1.  Patient also with complaints of chest pain noted to be hypoxic saturation in the 80s blood pressure in the 50s.Marland Kitchen  He was placed on nonrebreather mask as well as started on Levophed.  CT angiogram of the chest showed extensive bilateral pulmonary emboli with saddle embolus and associated right heart strain.  2.2 cm x 1.9 cm low-attenuation left adrenal mass.  Lower extremity Dopplers acute DVT involving the left popliteal vein left posterior tibial vein and left peroneal vein.  Patient was started on intravenous heparin echocardiogram showed ejection fraction of 60 to 65% no wall motion abnormalities grade 1 diastolic dysfunction.  Elevated troponin felt to be related to demand ischemia. Small avulsion fracture dorsal distal margin of the talus. While pt was admitted, he slipped on the floor in the bathroom with onset of R ankle pain that worsened throughout the day, positive for swelling and tenderness to palpation.  Orthopedic service follow-up Dr. Mardelle Matte in regards to right bimalleolar ankle fracture patient was cleared to undergo  ORIF with fixation of fibula 07/01/2020.  He is nonweightbearing for 6 to 8 weeks.  Patient's IV heparin was transitioned to Eliquis after ankle surgery for pulmonary emboli.  Therapy evaluations were completed and pt was recommended for CIR.     Patient's medical [facility] from [facility] has been reviewed by the rehabilitation admission coordinator and physician.  Past Medical History  Past Medical History:  Diagnosis Date  . Allergy   . Chronic back pain   . Diabetes mellitus without complication ([place])   . Duodenal ulcer hemorrhage 08/29/2014  . ED (erectile dysfunction)   . Esophageal stricture 08/30/2014  . Glucose intolerance  (impaired glucose tolerance)   . Hiatal hernia 08/30/2014  . Hypertension   . Hypokalemia 04/11/2013  . 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 Obesity   . Osteoarthritis   . Scoliosis 2016  . Spinal stenosis of lumbar region   . Urinary tract infection 04/11/2013    Family History   family history includes Asthma in his mother; Diabetes in his mother; Hypertension in his father; Stroke in his father.  Prior Rehab/Hospitalizations Has the patient had prior rehab or hospitalizations prior to admission? Yes  Has the patient had major surgery during 100 days prior to admission? Yes   Current Medications  Current Facility-Administered Medications:  .  0.45 % NaCl with KCl 20 mEq / L infusion, , Intravenous, Continuous, Ventura Bruns, PA-C, Last Rate: 75 mL/hr at 07/03/20 1620, New Bag at 07/03/20 1620 .  [COMPLETED] apixaban (ELIQUIS) tablet 10 mg, 10 mg, Oral, BID, 10 mg at 07/03/20 2041 **FOLLOWED BY** apixaban (ELIQUIS) tablet 5 mg, 5 mg, Oral, BID, Little Ishikawa, MD, 5 mg at 07/04/20 0931 .  bisacodyl (DULCOLAX) suppository 10 mg, 10 mg, Rectal, Daily PRN, Owens Shark, Blaine K, PA-C .  diphenhydrAMINE (BENADRYL) 12.5 MG/5ML elixir 12.5-25 mg, 12.5-25 mg, Oral, Q4H PRN, Owens Shark, Blaine K, PA-C .  docusate sodium (COLACE) capsule 100 mg, 100 mg, Oral, BID PRN, Merlene Pulling K, PA-C .  DULoxetine (CYMBALTA) DR capsule 60 mg, 60 mg, Oral, Daily, Little Ishikawa, MD, 60 mg at 07/04/20 0931 .  insulin aspart (novoLOG) injection 0-5 Units, 0-5 Units, Subcutaneous, QHS, Ventura Bruns, PA-C, 3 Units at 07/01/20 2211 .  insulin aspart (novoLOG) injection 0-9 Units, 0-9 Units, Subcutaneous, TID WC, Ventura Bruns, PA-C, 1 Units at 07/02/20 [phone] .  insulin glargine (LANTUS) injection 10 Units, 10 Units, Subcutaneous, QHS, Ventura Bruns, PA-C, 10 Units at 07/03/20 2044 .  magnesium citrate solution 1 Bottle, 1 Bottle, Oral, Once PRN, Owens Shark, Blaine K, PA-C .  metoCLOPramide (REGLAN) tablet 5-10 mg,  5-10 mg, Oral, Q8H PRN **OR** metoCLOPramide (REGLAN) injection 5-10 mg, 5-10 mg, Intravenous, Q8H PRN, Owens Shark, Blaine K, PA-C .  ondansetron (ZOFRAN) tablet 4 mg, 4 mg, Oral, Q6H PRN **OR** ondansetron (ZOFRAN) injection 4 mg, 4 mg, Intravenous, Q6H PRN, Owens Shark, Blaine K, PA-C .  oxyCODONE (Oxy IR/ROXICODONE) immediate release tablet 5 mg, 5 mg, Oral, Q6H PRN, Merlene Pulling K, PA-C, 5 mg at 07/04/20 0931 .  polyethylene glycol (MIRALAX / GLYCOLAX) packet 17 g, 17 g, Oral, Daily PRN, Owens Shark, Blaine K, PA-C .  simvastatin (ZOCOR) tablet 40 mg, 40 mg, Oral, QHS, Little Ishikawa, MD, 40 mg at 07/03/20 2041  Patients Current Diet:  Diet Order            Diet regular Room service appropriate? Yes; Fluid consistency: Thin  Diet effective now  Precautions / Restrictions Precautions Precautions: Fall Precaution Comments: r ankle fx with orif Restrictions Weight Bearing Restrictions: Yes RLE Weight Bearing: Non weight bearing Other Position/Activity Restrictions: L LE wrapped   Has the patient had 2 or more falls or a fall with injury in the past year? Yes  Prior Activity Level Limited Community (1-2x/wk): had been less active in the last 6 months due to back pain, was using a SPC, looking forward to getting back into travel once covid slowed down, has a cruise planned for the last 2 weeks in October  Prior Functional Level Self Care: Did the patient need help bathing, dressing, using the toilet or eating? Independent  Indoor Mobility: Did the patient need assistance with walking from room to room (with or without device)? Independent  Stairs: Did the patient need assistance with internal or external stairs (with or without device)? Independent  Functional Cognition: Did the patient need help planning regular tasks such as shopping or remembering to take medications? [place] Devices/Equipment: Cane (specify quad or  straight), Walker (specify type) (Straight cane, 4 wheel walker with brakes) Home Equipment: Walker - 4 wheels, Cane - single point, Grab bars - toilet, Grab bars - tub/shower, Bedside commode, Adaptive equipment, Wheelchair - manual, Shower seat  Prior Device Use: Indicate devices/aids used by the patient prior to current illness, exacerbation or injury? 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 did  not want to get OOB today) General ADL Comments: ADLs sitting EOB    Mobility  Overal bed mobility: Needs Assistance Bed Mobility: Supine to Sit Rolling: Modified independent (Device/Increase time) Sidelying to sit: Min guard Supine to sit: Supervision Sit to supine: Min assist, HOB elevated General bed mobility comments: Close supervision, use of bed rail, increased time/effort and use of momentum    Transfers  Overall transfer level: Needs assistance Equipment used: Rolling walker (2 wheeled) Transfers: Sit to/from Stand, [employer]  Transfers Sit to Stand: Min assist, +2 physical assistance Stand pivot transfers: Mod assist, +2 physical assistance General transfer comment: MinA + 2 to power up into standing; cues for hand/right foot placement. ModA + 2 for pivot towards left to chair. Cues for sequencing/direction. Good maintenance of weightbearing precautions    Ambulation / Gait / Stairs / Wheelchair Mobility  Ambulation/Gait General Gait Details: Unable to take complete hop on L foot, reliant on scooting L foot to pivot to recliner    Posture / Balance Dynamic Sitting Balance Sitting balance - Comments: pt able to sit EOB without assist Balance Overall balance assessment: Needs assistance Sitting-balance support: No upper extremity supported Sitting balance-Leahy Scale: Good Sitting balance - Comments: pt able to sit  EOB without assist Standing balance support: Bilateral upper extremity supported Standing balance-Leahy Scale: Poor Standing balance comment: Reliant on BUE support    Special needs/care consideration Diabetic management yes and Designated visitor Alver Sorrow (spouse)   Previous Environmental health practitioner (from acute therapy documentation) Living Arrangements: Spouse/significant other Available Help at Discharge: Family, Available 24 hours/day Type of Home: House Home Layout: Two level Alternate Level Stairs-Rails: Right Alternate Level Stairs-Number of Steps: 13 Home Access: Stairs to enter Entrance Stairs-Rails: Right Entrance Stairs-Number of Steps: 2 Bathroom Shower/Tub: Multimedia programmer: Handicapped height Bathroom Accessibility: Yes How Accessible: Accessible via walker [place]: No  Discharge Living Setting Plans for Discharge Living Setting: Patient's home Type of Home at Discharge: House Discharge Home Layout: Two level, 1/2 bath on main level, Bed/bath upstairs Alternate Level Stairs-Number of Steps: full flight Discharge Home Access: Stairs to  enter Entrance Stairs-Rails: None Entrance Stairs-Number of Steps: 3 Discharge Bathroom Shower/Tub: Tub/shower unit Discharge Bathroom Toilet: Standard Discharge Bathroom Accessibility: Yes How Accessible: Accessible via walker Does the patient have any problems obtaining your medications?: No  Social/Family/Support Systems Patient Roles: Spouse Anticipated Caregiver: spouse, Engineer, manufacturing Information: [phone] (cell) [phone] (home) Ability/Limitations of Caregiver: min assist Caregiver Availability: 24/7 Discharge Plan Discussed with Primary Caregiver: Yes Is Caregiver In Agreement with Plan?: Yes Does Caregiver/Family have Issues with Lodging/Transportation while Pt is in Rehab?: No  Goals Patient/Family Goal for Rehab: PT/OT mod I, SLP n/a Expected length of stay: 14-16 days Additional Information: 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 I met with patient at the bedside for inpatient rehabilitation assessment.  Patient will benefit from ongoing PT and OT, can actively participate in 3 hours of therapy a day 5 days of the week, and can make measurable gains during the admission.  Patient will also benefit from the coordinated team approach during an Inpatient Acute Rehabilitation admission.  The patient will receive intensive therapy as well as Rehabilitation physician, nursing, social worker, and care management interventions.  Due to safety, disease management, medication administration,  pain management and patient education the patient requires 24 hour a day rehabilitation nursing.  The patient is currently min to mod +2 with mobility and basic ADLs.  Discharge setting and therapy post discharge at home with home health is anticipated.  Patient has agreed to participate in the Acute Inpatient Rehabilitation Program and will admit today.  Preadmission Screen Completed By:  Michel Santee, PT, DPT 07/04/2020 11:57 AM ______________________________________________________________________   Discussed status with Dr. Ranell Patrick on 07/04/20  at 12:04 PM  and received approval for admission today.  Admission Coordinator:  Michel Santee, PT, DPT time 12:04 PM Sudie Grumbling 07/04/20    Assessment/Plan: Diagnosis: Avulsion fracture of dorsal distal margin of talus 1. Does the need for close, 24 hr/day Medical supervision in concert with the patient's rehab needs make it unreasonable for this patient to be served in a less intensive setting? Yes 2. Co-Morbidities requiring supervision/potential complications: chronic back pain s/p T11-T12 decompression 10/19, hyperglycemia, elevated troponin, pulmonary embolus, multiple left lower extremity DVTs 3. Due  to bladder management, bowel management, safety, skin/wound care, disease management, medication administration, pain management and patient education, does the patient require 24 hr/day rehab nursing? Yes 4. Does the patient require coordinated care of a physician, rehab nurse, PT, OT to address physical and functional deficits in the context of the above medical diagnosis(es)? Yes Addressing deficits in the following areas: balance, endurance, locomotion, strength, transferring, bowel/bladder control, bathing, dressing, feeding, grooming, toileting, swallowing and psychosocial support 5. Can the patient actively participate in an intensive therapy program of at least 3 hrs of therapy 5 days a week? Yes 6. The potential for patient to make measurable  gains while on inpatient rehab is good 7. Anticipated functional outcomes upon discharge from inpatient rehab: modified independent PT, modified independent OT, independent SLP 8. Estimated rehab length of stay to reach the above functional goals is: 16-18 days 9. Anticipated discharge destination: Home 10. Overall Rehab/Functional Prognosis: excellent   MD Signature: Leeroy Cha, MD

## 2020-07-04 NOTE — Progress Notes (Signed)
Inpatient Rehabilitation Medication Review by a Pharmacist  A complete drug regimen review was completed for this patient to identify any potential clinically significant medication issues.  Clinically significant medication issues were identified:  yes   Type of Medication Issue Identified Description of Issue Urgent (address now) Non-Urgent (address on AM team rounds) Plan Plan Accepted by Provider? (Yes / No / Pending AM Rounds)  Drug Interaction(s) (clinically significant)       Duplicate Therapy  Patient on sorbitol and miralax prn for moderate constipation.  Non-urgent Please clarify which med to use first Pending AM rounds  Allergy       No Medication Administration End Date       Incorrect Dose       Additional Drug Therapy Needed  Patient on Clonidine, Atenolol/Chlorthalidone and metformin at home.  Non-urgent Not on these during acute stay, but please evaluate for continuation.  Pending AM rounds  Other         Name of provider notified for urgent issues identified: Marlowe Shores, PA  Provider Method of Notification: Secure chat   For non-urgent medication issues to be resolved on team rounds tomorrow morning a [facility] Secure Chat Handoff was sent to: Marlowe Shores, PA   Pharmacist comments:   Time spent performing this drug regimen review (minutes):  10   Tenleigh Byer A. Levada Dy, PharmD, BCPS, FNKF Clinical Pharmacist [place] Please utilize Amion for appropriate phone number to reach the unit pharmacist ([place])  07/04/2020 5:59 PM

## 2020-07-04 NOTE — Progress Notes (Signed)
Attempted to see patient this evening, however he is in route to CIR. Will see him in the morning. If there are any questions or concerns regarding post-operative restrictions please feel free to reach out to Dr. Mardelle Matte or myself.   Contact information:   Weekdays 8-5 Merlene Pulling, [place] [phone] A fter hours and holidays please check [email or web address] for group call information for [facility]  Ventura Bruns 07/04/2020, 5:14 PM

## 2020-07-04 NOTE — Progress Notes (Signed)
Physical Therapy Treatment Patient Details Name: Keith [name] MRN: [id number] DOB: 02-29-1951 Today's Date: 07/04/2020    History of Present Illness Pt is a 69 y.o. male admitted 06/26/20 with near syncope after going to the bathroom. Pt with saddle PE with obstructive shock s/p tPA and heparin. On 9/21, pt with slip on wet floor while standing, sustained R trimalleolar ankle fracture with dorsal dislocation. S/p ORIF 07/01/2020. PMH includes chronic back pain, opiate use, DM, scoliosis, T11-12 laminectomy.    PT Comments    Pt progressing with mobility. Able to come to EOB with supervision but no physical assist. Min A needed for power up to RW from elevated bed with vc's for L foot and appropriate hand placement. Pt gets anxious in standing, vc's for slow and controlled mvmt as well as breathing throughout. Pt does not have the upper body strength to take full body wt through arms to slide LLE fwd and lacks the lower body strength to be able to hop on LLE. Pivot to chair with RW and mod A performed with mod A for control with descent into chair. Pt performed LE there in bed and chair as well as chair push ups once seated. PT will continue to follow.    Follow Up Recommendations  CIR     Equipment Recommendations  None recommended by PT    Recommendations for Other Services       Precautions / Restrictions Precautions Precautions: Fall Precaution Comments: r ankle fx with orif Required Braces or Orthoses: Splint/Cast Splint/Cast: R ankle Restrictions Weight Bearing Restrictions: Yes RLE Weight Bearing: Non weight bearing    Mobility  Bed Mobility Overal bed mobility: Needs Assistance Bed Mobility: Supine to Sit     Supine to sit: Supervision     General bed mobility comments: pt able to come to EOB from [facility] slightly elevated with increased time but no physical assist  Transfers Overall transfer level: Needs assistance Equipment used: Rolling walker (2  wheeled) Transfers: Sit to/from [facility] Sit to Stand: Min assist Stand pivot transfers: Mod assist       General transfer comment: min A for power up and vc's for LLE placement as well as reminder to keep RLE NWB. In standing pt gets anxious, vc's needed to take time moving and keep breathing. Mod A  and RW for pivot to recliner. L shoe donned before transfer for cushion to L side. vc's for tricep extension instead of shoulder shrugging  Ambulation/Gait             General Gait Details: pt lacks upper body strength to be able to take full body wt through arms and slide L foot fwd and lacks lower body strength to be able to hop on LLE.    Stairs             Wheelchair Mobility    Modified Rankin (Stroke Patients Only)       Balance Overall balance assessment: Needs assistance Sitting-balance support: No upper extremity supported Sitting balance-Leahy Scale: Good Sitting balance - Comments: pt able to sit EOB without assist   Standing balance support: Bilateral upper extremity supported Standing balance-Leahy Scale: Poor Standing balance comment: heavily Reliant on BUE support and external support                            Cognition Arousal/Alertness: Awake/alert Behavior During Therapy: WFL for tasks assessed/performed Overall Cognitive Status: Within Functional Limits for  tasks assessed                                        Exercises General Exercises - Lower Extremity Ankle Circles/Pumps: Left;15 reps;Supine Quad Sets: Both;15 reps;Supine;AROM Long Arc Quad: AROM;Right;Seated;10 reps Straight Leg Raises: AROM;Right;5 reps;Supine Amputee Exercises Chair Push Up: AROM;5 reps;Seated    General Comments General comments (skin integrity, edema, etc.): discussed the need to protect L ankle through healing process of R as 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      Pertinent Vitals/Pain Pain  Assessment: Faces Faces Pain Scale: Hurts little more Pain Location: R ankle Pain Descriptors / Indicators: Sore Pain Intervention(s): Limited activity within patient's tolerance;Monitored during session    Home Living                      Prior Function            PT Goals (current goals can now be found in the care plan section) Acute Rehab PT Goals Patient Stated Goal: Agreeable for post-acute rehab to regain strength before return home PT Goal Formulation: With patient/family Time For Goal Achievement: 07/16/20 Potential to Achieve Goals: Good Progress towards PT goals: Progressing toward goals    Frequency    Min 5X/week      PT Plan Current plan remains appropriate    Co-evaluation              AM-PAC PT "6 Clicks" Mobility   Outcome Measure  Help needed turning from your back to your side while in a flat bed without using bedrails?: None Help needed moving from lying on your back to sitting on the side of a flat bed without using bedrails?: None Help needed moving to and from a bed to a chair (including a wheelchair)?: A Lot Help needed standing up from a chair using your arms (e.g., wheelchair or bedside chair)?: A Little Help needed to walk in hospital room?: Total Help needed climbing 3-5 steps with a railing? : Total 6 Click Score: 15    End of Session Equipment Utilized During Treatment: Gait belt Activity Tolerance: Patient tolerated treatment well Patient left: in chair;with call bell/phone within reach;with chair alarm set Nurse Communication: Mobility status PT Visit Diagnosis: Other abnormalities of gait and mobility (R26.89);Difficulty in walking, not elsewhere classified (R26.2);Muscle weakness (generalized) (M62.81);Pain Pain - Right/Left: Right Pain - part of body: Ankle and joints of foot     Time: 1321-1350 PT Time Calculation (min) (ACUTE ONLY): 29 min  Charges:  $Therapeutic Exercise: 8-22 mins $Therapeutic Activity:  8-22 mins                     Leighton Roach, [place]  Pager [phone] Office [place] 07/04/2020, 2:27 PM

## 2020-07-04 NOTE — H&P (Signed)
Physical Medicine and Rehabilitation Admission H&P   CC: right ankle trimalleolar fracture dorsal subluxation of the talus relative to the tibial plafond.  HPI: Keith [name] is a 69 year old right-handed male with history of chronic back pain status post T11-T12 decompression 07/28/2018 maintained on OxyContin 10 mg 3 times daily as well as oxycodone 1 to 2 tablets every 6 hours as needed, hypertension, tobacco abuse, diabetes mellitus.  Patient with recent admission 3 weeks ago for acute on chronic back pain seen by neurosurgery Dr. Trenton Gammon.  MRI thoracic lumbar spine showed T11-12 cord compression with myelopathic findings despite an interval laminectomy.  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  He was placed on IV steroids with good improvement ambulating 250 feet rolling walker and was to follow-up outpatient with Dr. Venetia Constable.  Per chart review patient lives with spouse.  Two-level home 2 steps to entry.  Independent with assistive device.  Presented 06/26/2020 after reported fall related near syncopal event when he slipped on a wet floor without loss of consciousness sustaining right ankle trimalleolar fracture dorsal subluxation of the talus relative to the tibial plafond.  Small avulsion fracture dorsal distal margin of the talus.  Admission chemistries glucose 328 creatinine 2.0 BUN 24, troponin 45 hemoglobin 12.2, alcohol negative, lactic acid 2.1.  Patient also with complaints of chest pain noted to be hypoxic saturation in the 80s blood pressure in the 50s.Marland Kitchen  He was placed on nonrebreather mask as well as started on Levophed.  CT angiogram of the chest showed extensive bilateral pulmonary emboli with saddle embolus and associated right heart strain.  2.2 cm x 1.9 cm low-attenuation left adrenal mass.  Lower extremity Dopplers acute DVT involving the left popliteal vein left posterior tibial vein and left peroneal vein.  Patient was started on intravenous  heparin echocardiogram showed ejection fraction of 60 to 65% no wall motion abnormalities grade 1 diastolic dysfunction.  Elevated troponin felt to be related to demand ischemia.  Orthopedic service follow-up Dr. Mardelle Matte in regards to right bimalleolar ankle fracture patient was cleared to undergo ORIF with fixation of fibula 07/01/2020.  He is nonweightbearing for 6 to 8 weeks..  Patient's IV heparin was transitioned to Eliquis after ankle surgery for pulmonary emboli.  Review of Systems  Constitutional: Negative for chills and fever.  HENT: Negative for hearing loss.   Eyes: Negative for blurred vision, double vision and photophobia.  Respiratory: Positive for shortness of breath.   Cardiovascular: Positive for chest pain and leg swelling. Negative for palpitations.  Gastrointestinal: Positive for constipation. Negative for heartburn and nausea.       GERD  Genitourinary: Negative for dysuria, flank pain and hematuria.  Musculoskeletal: Positive for back pain, falls and myalgias.  Skin: Negative for rash.  All other systems reviewed and are negative.  Past Medical History:  Diagnosis Date  . Allergy   . Chronic back pain   . Diabetes mellitus without complication ([place])   . Duodenal ulcer hemorrhage 08/29/2014  . ED (erectile dysfunction)   . Esophageal stricture 08/30/2014  . Glucose intolerance (impaired glucose tolerance)   . Hiatal hernia 08/30/2014  . Hypertension   . Hypokalemia 04/11/2013  . 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 Obesity   . Osteoarthritis   . Scoliosis 2016  . Spinal stenosis of lumbar region   . Urinary tract infection 04/11/2013   Past Surgical History:  Procedure Laterality Date  . COLONOSCOPY  2008  . ESOPHAGOGASTRODUODENOSCOPY N/A 08/29/2014   Procedure:  ESOPHAGOGASTRODUODENOSCOPY (EGD);  Surgeon: Lafayette Dragon, MD;  Location: [facility];  Service: Endoscopy;  Laterality: N/A;  . LAMINECTOMY N/A 07/28/2018   Procedure: THORACIC ELEVEN- THORACIC TWELVE  POSTERIOR DECOMPRESSION LAMINECTOMY;  Surgeon: Judith Part, MD;  Location: [facility];  Service: Neurosurgery;  Laterality: N/A;  . LUMBAR LAMINECTOMY    . NASAL POLYP SURGERY    . TONSILLECTOMY     Family History  Problem Relation Age of Onset  . Diabetes Mother   . Asthma Mother   . Hypertension Father   . Stroke Father   . Colon cancer Neg Hx    Social History:  reports that he has quit smoking. He has a 25.00 pack-year smoking history. He has never used smokeless tobacco. He reports previous alcohol use. He reports that he does not use drugs. Allergies:  Allergies  Allergen Reactions  . Lisinopril Other (See Comments)    Caused a body ache   Medications Prior to Admission  Medication Sig Dispense Refill  . apixaban (ELIQUIS) 5 MG TABS tablet Take 1 tablet (5 mg total) by mouth 2 (two) times daily. 60 tablet 01  . atenolol-chlorthalidone (TENORETIC) 50-25 MG tablet Take 1 tablet by mouth daily. Pt needs appt for further refills. (Patient taking differently: Take 1 tablet by mouth daily. ) 90 tablet 2  . cloNIDine (CATAPRES) 0.1 MG tablet Take 1 tablet (0.1 mg total) by mouth daily. 90 tablet 2  . cyclobenzaprine (FLEXERIL) 10 MG tablet Take 1 tablet (10 mg total) by mouth 3 (three) times daily as needed for muscle spasms. 30 tablet 0  . docusate sodium (COLACE) 100 MG capsule Take 1 capsule (100 mg total) by mouth 2 (two) times daily. 10 capsule 0  . DULoxetine (CYMBALTA) 30 MG capsule Take 60 mg by mouth daily.    . metFORMIN (GLUCOPHAGE) 500 MG tablet Take 0.5 tablets (250 mg total) by mouth daily with breakfast. 45 tablet 2  . Multiple Vitamin (MULITIVITAMIN WITH MINERALS) TABS Take 1 tablet by mouth daily.    Marland Kitchen oxyCODONE (OXY IR/ROXICODONE) 5 MG immediate release tablet Take 1-2 tablets (5-10 mg total) by mouth every 6 (six) hours as needed for severe pain ((score 7 to 10)). (Patient taking differently: Take 5-10 mg by mouth every 6 (six) hours as needed (for severe,  "breakthrough pain" with a score/level of 7-10). ) 40 tablet 0  . OXYCONTIN 10 MG 12 hr tablet Take 10 mg by mouth 3 (three) times daily.     . simvastatin (ZOCOR) 40 MG tablet Take 1 tablet (40 mg total) by mouth daily. (Patient taking differently: Take 40 mg by mouth at bedtime. ) 90 tablet 2    Drug Regimen Review Drug regimen was reviewed and remains appropriate with no significant issues identified  Home: Home Living Family/patient expects to be discharged to:: Private residence Living Arrangements: Spouse/significant other Available Help at Discharge: Family, Available 24 hours/day Type of Home: House Home Access: Stairs to enter [employer] of Steps: 2 Entrance Stairs-Rails: Right Home Layout: Two level Alternate Level Stairs-Number of Steps: 13 Alternate Level Stairs-Rails: Right Bathroom Shower/Tub: Multimedia programmer: Handicapped height Bathroom Accessibility: Yes Home Equipment: Environmental consultant - 4 wheels, Cane - single point, Grab bars - toilet, Grab bars - tub/shower, Bedside commode, Adaptive equipment, Wheelchair - manual, Chief Technology Officer: Reacher, Sock aid, Long-handled shoe horn, Long-handled sponge   Functional History: Prior Function Level of Independence: Independent with assistive device(s) Comments: rollator  Functional Status:  Mobility: Bed Mobility  Overal bed mobility: Needs Assistance Bed Mobility: Supine to Sit Rolling: Modified independent (Device/Increase time) Sidelying to sit: Min guard Supine to sit: Supervision Sit to supine: Min assist, HOB elevated General bed mobility comments: Close supervision, use of bed rail, increased time/effort and use of momentum Transfers Overall transfer level: Needs assistance Equipment used: Rolling walker (2 wheeled) Transfers: Sit to/from Stand, [employer] Transfers Sit to Stand: Min assist, +2 physical assistance Stand pivot transfers: Mod assist, +2 physical  assistance General transfer comment: MinA + 2 to power up into standing; cues for hand/right foot placement. ModA + 2 for pivot towards left to chair. Cues for sequencing/direction. Good maintenance of weightbearing precautions Ambulation/Gait General Gait Details: Unable to take complete hop on L foot, reliant on scooting L foot to pivot to recliner  ADL: ADL Overall ADL's : Needs assistance/impaired Eating/Feeding: Independent Grooming: Wash/dry hands, Wash/dry face, Oral care, Set up, Sitting Grooming Details (indicate cue type and reason): EOB Upper Body Bathing: Set up, Sitting Upper Body Bathing Details (indicate cue type and reason): EOB Lower Body Bathing: Moderate assistance, Sitting/lateral leans Upper Body Dressing : Set up, Sitting Lower Body Dressing: Moderate assistance, Sitting/lateral leans Functional mobility during ADLs:  (Pt. did  not want to get OOB today) General ADL Comments: ADLs sitting EOB  Cognition: Cognition Overall Cognitive Status: Within Functional Limits for tasks assessed Orientation Level: Oriented X4 Cognition Arousal/Alertness: Awake/alert Behavior During Therapy: WFL for tasks assessed/performed Overall Cognitive Status: Within Functional Limits for tasks assessed   Physical Exam: Height 6' (1.829 m), weight 120.1 kg. Physical Exam General: Alert and oriented x 3, No apparent distress HEENT: Head is normocephalic, atraumatic, PERRLA, EOMI, sclera anicteric, oral mucosa pink and moist, dentition intact, ext ear canals clear,  Neck: Supple without JVD or lymphadenopathy Heart: Reg rate and rhythm. No murmurs rubs or gallops Chest: CTA bilaterally without wheezes, rales, or rhonchi; no distress Abdomen: Soft, non-tender, non-distended, bowel sounds positive. Extremities: No clubbing, cyanosis, or edema. Pulses are 2+ Skin: Right lower extremity with soft splint in place.  Neurovascular sensation intact.  Neuro: Pt is cognitively appropriate  with normal insight, memory, and awareness. Cranial nerves 2-12 are intact. Sensory exam is normal. Reflexes are 2+ in all 4's. Fine motor coordination is intact. No tremors. Patient is alert in no acute distress oriented x3 and follows full commands. Can wiggle toes in right foot Psych: Pt's affect is appropriate. Pt is cooperative     Results for orders placed or performed during the hospital encounter of 07/04/20 (from the past 48 hour(s))  Glucose, capillary     Status: None   Collection Time: 07/04/20  6:03 PM  Result Value Ref Range   Glucose-Capillary 99 70 - 99 mg/dL    Comment: Glucose reference range applies only to samples taken after fasting for at least 8 hours.   No results found.     Medical Problem List and Plan: 1.  Decreased functional mobility secondary to right bimalleolar ankle fracture status post ORIF 3/41/9622 complicated by pulmonary emboli.  Nonweightbearing x6 to 8 weeks  -patient may not shower  -ELOS/Goals: modI in 16-18 days 2.  Antithrombotics: -DVT/anticoagulation: Eliquis  -antiplatelet therapy: N/A 3. Pain Management/Chronic Back pain: Cymbalta 60 mg daily, oxycodone as needed.  Patient had been on oxycodone 5 to 10 mg every 6 hours as needed prior to admission as well as Flexeril and OxyContin 10 mg 3 times daily. Well controlled with medication.  Patient is to follow-up outpatient with Dr. Venetia Constable  neurosurgery 4. Mood: Provide emotional support  -antipsychotic agents: N/A 5. Neuropsych: This patient is capable of making decisions on his own behalf. 6. Skin/Wound Care: Routine skin checks 7. Fluids/Electrolytes/Nutrition: Routine in and outs with follow-up chemistries 8.  Acute blood loss anemia.  Follow-up CBC. Hgb 9.8 9/26 9.  Diabetes mellitus peripheral neuropathy.  Hemoglobin A1c 6.7.  Currently on Lantus insulin 10 units nightly.  Patient on Glucophage 250 mg daily prior to admission.  Resume as needed. Most reads slightly elevated. Educated  regarding limiting sugar.  10.  Hyperlipidemia.  Zocor 11.  AKI.  Resolved.  Follow-up chemistries 12.  Incidental findings left adrenal mass.  Follow-up outpatient  Cathlyn Parsons, PA-C 07/04/2020   I have personally performed a face to face diagnostic evaluation, including, but not limited to relevant history and physical exam findings, of this patient and developed relevant assessment and plan.  Additionally, I have reviewed and concur with the physician assistant's documentation above.  The patient's status has not changed. The original post admission physician evaluation remains appropriate, and any changes from the pre-admission screening or documentation from the acute chart are noted above.   Leeroy Cha, MD

## 2020-07-04 NOTE — Care Management Important Message (Signed)
Important Message  Patient Details  Name: Keith [name] MRN: [id number] Date of Birth: Jan 12, 1951   Medicare Important Message Given:  Yes - Important Message mailed due to current National Emergency  Verbal consent obtained due to current National Emergency  Relationship to patient: Self Contact Name: Chaney Maclaren Call Date: 07/04/20  Time: 1213 Phone: [phone] Outcome: Spoke with contact Important Message mailed to: Patient address on file    Delorse Lek 07/04/2020, 12:14 PM

## 2020-07-04 NOTE — Discharge Summary (Signed)
Physician Discharge Summary  Keith [name] FBP:[id number] DOB: November 21, 1950 DOA: 06/26/2020  PCP: [name], [name], MD  Admit date: 06/26/2020 Discharge date: 07/04/2020  Admitted From: Home Disposition: CIR  Recommendations for Outpatient Follow-up:  1. Follow up with PCP in 1-2 weeks Orthopedic surgery as scheduled  Discharge Condition: Stable CODE STATUS: Full Diet recommendation: Diabetic diet  Brief/Interim Summary: Pt presented with near syncopeafter returning from the bathroom. Also complained of chest pain like "elephant sitting on his chest" noted to be hypoxic with saturation in the 80s, blood pressure in the 50s, brought in by EMS as a code STEMI .Placed on nonrebreather, initial blood pressure 70s, started on Levophed, central line placed in ED, CT angiogram obtained which confirmed saddle PE. Recent admission 3 weeks ago for acute on chronic back pain he is on chronic opiates for back pain for at least 2 years by neurosurgery. He reports since his admission he has mostly been sitting around, limited movement.   Patient admitted as above with atypical chest pain hypoxia found to have massive saddle PE as well as left lower extremity DVT likely provoked in the setting of poor ambulatory status and prolonged sedentary lifestyle.  Patient tolerated TPA quite well, transition from heparin drip to p.o. Eliquis.  Tolerating well. Unfortunately during hospitalization patient had a mechanical fall due to pain and subsequently had notable malleolar ankle fracture with dorsal dislocation requiring ORIF.  Patient tolerated procedure quite well on 07/01/2020, has been transitioned to p.o. Eliquis as above, pain continues to be well controlled otherwise stable and agreeable for discharge.  Patient evaluated by PT recommending CIR which has been agreeable, currently transitioning to their service, would be happy to follow along if necessary otherwise patient has improved drastically and  otherwise stable for discharge to CIR for ongoing physical therapy, he is in good spirits as he has a cruise planned next month he is hoping to be more ambulatory to enjoy this trip to the fullest.  Discharge Diagnoses:  Principal Problem:   Acute saddle pulmonary embolism with acute cor pulmonale (HCC) Active Problems:   Morbid obesity ([place])   Osteoarthritis   Spinal stenosis of lumbar region   AKI (acute kidney injury) ([place])   Hyperlipidemia associated with type 2 diabetes mellitus ([place])   Type 2 diabetes mellitus with diabetic neuropathy, unspecified (HCC)   Acute respiratory failure with hypoxia (HCC)   Closed right trimalleolar fracture  Discharge Instructions  Discharge Instructions    Call MD for:  difficulty breathing, headache or visual disturbances   Complete by: As directed    Call MD for:  persistant dizziness or light-headedness   Complete by: As directed    Call MD for:  severe uncontrolled pain   Complete by: As directed    Call MD for:  temperature >100.4   Complete by: As directed    Diet - low sodium heart healthy   Complete by: As directed    Increase activity slowly   Complete by: As directed    Leave dressing on - Keep it clean, dry, and intact until clinic visit   Complete by: As directed      Allergies as of 07/04/2020      Reactions   Lisinopril Other (See Comments)   Caused a body ache      Medication List    STOP taking these medications   sildenafil 20 MG tablet Commonly known as: REVATIO     TAKE these medications   apixaban 5 MG Tabs  tablet Commonly known as: ELIQUIS Take 1 tablet (5 mg total) by mouth 2 (two) times daily.   atenolol-chlorthalidone 50-25 MG tablet Commonly known as: TENORETIC Take 1 tablet by mouth daily. Pt needs appt for further refills. What changed: additional instructions   cloNIDine 0.1 MG tablet Commonly known as: CATAPRES Take 1 tablet (0.1 mg total) by mouth daily.   cyclobenzaprine 10 MG  tablet Commonly known as: FLEXERIL Take 1 tablet (10 mg total) by mouth 3 (three) times daily as needed for muscle spasms.   docusate sodium 100 MG capsule Commonly known as: COLACE Take 1 capsule (100 mg total) by mouth 2 (two) times daily.   DULoxetine 30 MG capsule Commonly known as: CYMBALTA Take 60 mg by mouth daily.   metFORMIN 500 MG tablet Commonly known as: GLUCOPHAGE Take 0.5 tablets (250 mg total) by mouth daily with breakfast.   multivitamin with minerals Tabs tablet Take 1 tablet by mouth daily.   OxyCONTIN 10 mg 12 hr tablet Generic drug: oxyCODONE Take 10 mg by mouth 3 (three) times daily. What changed: Another medication with the same name was changed. Make sure you understand how and when to take each.   oxyCODONE 5 MG immediate release tablet Commonly known as: Oxy IR/ROXICODONE Take 1-2 tablets (5-10 mg total) by mouth every 6 (six) hours as needed for severe pain ((score 7 to 10)). What changed: reasons to take this   simvastatin 40 MG tablet Commonly known as: Zocor Take 1 tablet (40 mg total) by mouth daily. What changed: when to take this            Discharge Care Instructions  (From admission, onward)         Start     Ordered   07/04/20 0000  Leave dressing on - Keep it clean, dry, and intact until clinic visit        07/04/20 1201          Follow-up Information    Marchia Bond, MD. Schedule an appointment as soon as possible for a visit in 2 weeks.   Specialty: Orthopedic Surgery Contact information: [place] [address] [phone]              Allergies  Allergen Reactions   Lisinopril Other (See Comments)    Caused a body ache    Consultations:  PCCM, Orthopedic surgery   Procedures/Studies: DG Ankle Complete Right  Result Date: 06/28/2020 CLINICAL DATA:  Right foot and ankle pain EXAM: RIGHT ANKLE - COMPLETE 3+ VIEW; RIGHT FOOT - 2 VIEW COMPARISON:  None. FINDINGS: Right  ankle: Frontal, oblique, and lateral views demonstrate an oblique fracture of the distal right fibula, with mild displacement. Small ossific densities are seen within the medial aspect of the ankle mortise, consistent with avulsion fractures. Exact donor site is not identified, though likely from the medial malleolus. Coronally oriented minimally displaced fracture of the posterior malleolus is seen on the lateral view. There is dorsal subluxation of the talus relative to the tibial plafond. There is diffuse soft tissue edema. Right foot: Frontal and lateral views of the right foot demonstrate a small avulsion fracture off the dorsal distal margin of the talus. There is dorsal subluxation of the talus relative to the tibial plafond. No other acute bony abnormalities. IMPRESSION: 1. Trimalleolar fracture as above. 2. Dorsal subluxation of the talus relative to the tibial plafond. 3. Small avulsion fracture dorsal distal margin of the talus. 4. Diffuse soft tissue  edema. Electronically Signed   By: Randa Ngo M.D.   On: 06/28/2020 20:44   CT ANKLE RIGHT WO CONTRAST  Result Date: 06/29/2020 CLINICAL DATA:  Evaluate right ankle fracture. EXAM: CT OF THE RIGHT ANKLE WITHOUT CONTRAST TECHNIQUE: Multidetector CT imaging of the right ankle was performed according to the standard protocol. Multiplanar CT image reconstructions were also generated. COMPARISON:  Radiographs 06/28/2020 and 06/29/2020 FINDINGS: There is a mildly displaced oblique coursing fracture of the distal fibula at and above the level of the ankle mortise. Maximum displacement is 7 mm. Small avulsion type fractures involving the distal tip of the medial malleolus. Complex comminuted intra-articular fracture involving the posterior malleolar region of the tibia. There is comminution at the articular surface posteriorly and maximum displacement of the articular component is 6.5 mm. Small fracture fragments are noted in the anterior tibiotalar joint  space. The talus is intact. The subtalar joints are maintained. No calcaneal fracture. The visualized midfoot bony structures are intact. Grossly by CT the medial and lateral and anterior ankle tendons and ligaments are intact. The Achilles tendon is intact. Moderate subcutaneous soft tissue swelling/edema/fluid/hematoma. IMPRESSION: 1. Complex comminuted intra-articular fracture involving the posterior malleolar region of the tibia. There is comminution at the articular surface posteriorly and maximum displacement of the articular component is 6.5 mm. 2. Small avulsion type fractures involving the distal tip of the medial malleolus. 3. Mildly displaced oblique coursing fracture of the distal fibula at and above the level of the ankle mortise. Maximum displacement is 7 mm. Electronically Signed   By: Marijo Sanes M.D.   On: 06/29/2020 13:10   DG Chest Portable 1 View  Result Date: 06/26/2020 CLINICAL DATA:  Chest pain, known pulmonary emboli EXAM: PORTABLE CHEST 1 VIEW COMPARISON:  06/26/2020 FINDINGS: Cardiac shadow is stable. Right jugular central line is again noted and stable. No pneumothorax is noted. The lungs are clear. IMPRESSION: No acute abnormality noted. Electronically Signed   By: Inez Catalina M.D.   On: 06/26/2020 22:30   DG Chest Portable 1 View  Result Date: 06/26/2020 CLINICAL DATA:  Check central line placement EXAM: PORTABLE CHEST 1 VIEW COMPARISON:  08/29/2014, CT from earlier in the same day. FINDINGS: Cardiac shadow is mildly prominent but stable. Right jugular central line is noted at the cavoatrial junction. No pneumothorax is seen. The overall inspiratory effort is poor although no focal infiltrate is seen. No bony abnormality is noted. IMPRESSION: No pneumothorax following central line placement as described. Electronically Signed   By: Inez Catalina M.D.   On: 06/26/2020 21:36   DG Ankle Right Port  Result Date: 06/29/2020 CLINICAL DATA:  Right ankle fracture reduction, EXAM:  PORTABLE RIGHT ANKLE - 2 VIEW COMPARISON:  06/28/2020 FINDINGS: Two view radiograph of the right ankle spur formed within an external immobilizer which obscures fine bony detail. There has been interval reduction of tibiotalar subluxation. Medial joint space widening persists. Fracture fragments are in grossly anatomic alignment on this limited examination with persistent 2-3 mm posterior displacement and distraction of the distal fibular fracture fragment. IMPRESSION: Interval reduction of right tibiotalar subluxation Electronically Signed   By: Fidela Salisbury MD   On: 06/29/2020 00:46   DG Foot 2 Views Right  Result Date: 06/28/2020 CLINICAL DATA:  Right foot and ankle pain EXAM: RIGHT ANKLE - COMPLETE 3+ VIEW; RIGHT FOOT - 2 VIEW COMPARISON:  None. FINDINGS: Right ankle: Frontal, oblique, and lateral views demonstrate an oblique fracture of the distal right fibula, with mild displacement.  Small ossific densities are seen within the medial aspect of the ankle mortise, consistent with avulsion fractures. Exact donor site is not identified, though likely from the medial malleolus. Coronally oriented minimally displaced fracture of the posterior malleolus is seen on the lateral view. There is dorsal subluxation of the talus relative to the tibial plafond. There is diffuse soft tissue edema. Right foot: Frontal and lateral views of the right foot demonstrate a small avulsion fracture off the dorsal distal margin of the talus. There is dorsal subluxation of the talus relative to the tibial plafond. No other acute bony abnormalities. IMPRESSION: 1. Trimalleolar fracture as above. 2. Dorsal subluxation of the talus relative to the tibial plafond. 3. Small avulsion fracture dorsal distal margin of the talus. 4. Diffuse soft tissue edema. Electronically Signed   By: Randa Ngo M.D.   On: 06/28/2020 20:44   DG MINI C-ARM IMAGE ONLY  Result Date: 07/01/2020 There is no interpretation for this exam.  This order is  for images obtained during a surgical procedure.  Please See "Surgeries" Tab for more information regarding the procedure.   ECHOCARDIOGRAM COMPLETE  Result Date: 06/27/2020    ECHOCARDIOGRAM REPORT   Patient Name:   Keith [name] Date of Exam: 06/27/2020 Medical Rec #:  [id number]        Height:       72.0 in Accession #:    [id number]       Weight:       267.0 lb Date of Birth:  October 05, 1951       BSA:          2.409 m Patient Age:    50 years         BP:           118/88 mmHg Patient Gender: M                HR:           81 bpm. Exam Location:  Inpatient Procedure: 2D Echo Indications:    pulmonary embolus 415.19  History:        Patient has no prior history of Echocardiogram examinations.                 Risk Factors:Diabetes and Hypertension.  Sonographer:    Johny Chess Referring Phys: [id number] LAURA R GLEASON  Sonographer Comments: Image acquisition challenging due to patient body habitus. IMPRESSIONS  1. Left ventricular ejection fraction, by estimation, is 60 to 65%. The left ventricle has normal function. The left ventricle has no regional wall motion abnormalities. There is mild concentric left ventricular hypertrophy. Left ventricular diastolic parameters are consistent with Grade I diastolic dysfunction (impaired relaxation). There is the interventricular septum is flattened in systole and diastole, consistent with right ventricular pressure and volume overload.  2. Right ventricular systolic function is moderately reduced. The right ventricular size is moderately enlarged. There is moderately elevated pulmonary artery systolic pressure. The estimated right ventricular systolic pressure is 89.3 mmHg.  3. The mitral valve is normal in structure. No evidence of mitral valve regurgitation. No evidence of mitral stenosis.  4. The aortic valve is tricuspid. Aortic valve regurgitation is not visualized. Mild aortic valve sclerosis is present, with no evidence of aortic valve stenosis.  5. The  inferior vena cava is dilated in size with <50% respiratory variability, suggesting right atrial pressure of 15 mmHg. FINDINGS  Left Ventricle: Left ventricular ejection fraction, by estimation, is 60 to 65%. The left ventricle has  normal function. The left ventricle has no regional wall motion abnormalities. The left ventricular internal cavity size was normal in size. There is  mild concentric left ventricular hypertrophy. The interventricular septum is flattened in systole and diastole, consistent with right ventricular pressure and volume overload. Left ventricular diastolic parameters are consistent with Grade I diastolic dysfunction (impaired relaxation). Normal left ventricular filling pressure. Right Ventricle: The right ventricular size is moderately enlarged. No increase in right ventricular wall thickness. Right ventricular systolic function is moderately reduced. There is moderately elevated pulmonary artery systolic pressure. The tricuspid  regurgitant velocity is 2.55 m/s, and with an assumed right atrial pressure of 15 mmHg, the estimated right ventricular systolic pressure is 45.8 mmHg. Left Atrium: Left atrial size was normal in size. Right Atrium: Right atrial size was normal in size. Pericardium: There is no evidence of pericardial effusion. Mitral Valve: The mitral valve is normal in structure. No evidence of mitral valve regurgitation. No evidence of mitral valve stenosis. Tricuspid Valve: The tricuspid valve is normal in structure. Tricuspid valve regurgitation is mild . No evidence of tricuspid stenosis. Aortic Valve: The aortic valve is tricuspid. Aortic valve regurgitation is not visualized. Mild aortic valve sclerosis is present, with no evidence of aortic valve stenosis. Pulmonic Valve: The pulmonic valve was normal in structure. Pulmonic valve regurgitation is trivial. No evidence of pulmonic stenosis. Aorta: The aortic root is normal in size and structure. Venous: The inferior vena cava  is dilated in size with less than 50% respiratory variability, suggesting right atrial pressure of 15 mmHg. IAS/Shunts: No atrial level shunt detected by color flow Doppler.  LEFT VENTRICLE PLAX 2D LVIDd:         4.00 cm  Diastology LVIDs:         3.10 cm  LV e' medial:    5.98 cm/s LV PW:         1.30 cm  LV E/e' medial:  9.5 LV IVS:        1.40 cm  LV e' lateral:   6.20 cm/s LVOT diam:     2.00 cm  LV E/e' lateral: 9.1 LV SV:         47 LV SV Index:   20 LVOT Area:     3.14 cm  RIGHT VENTRICLE             IVC RV Basal diam:  4.71 cm     IVC diam: 2.30 cm RV S prime:     11.70 cm/s LEFT ATRIUM             Index       RIGHT ATRIUM           Index LA diam:        3.50 cm 1.45 cm/m  RA Area:     14.20 cm LA Vol (A2C):   45.8 ml 19.01 ml/m RA Volume:   33.10 ml  13.74 ml/m LA Vol (A4C):   40.1 ml 16.65 ml/m LA Biplane Vol: 43.2 ml 17.93 ml/m  AORTIC VALVE LVOT Vmax:   93.30 cm/s LVOT Vmean:  59.200 cm/s LVOT VTI:    0.150 m  AORTA Ao Root diam: 3.50 cm Ao Asc diam:  3.50 cm MV E velocity: 56.60 cm/s  TRICUSPID VALVE MV A velocity: 74.10 cm/s  TR Peak grad:   26.0 mmHg MV E/A ratio:  0.76        TR Vmax:        255.00 cm/s  SHUNTS                            Systemic VTI:  0.15 m                            Systemic Diam: 2.00 cm Fransico Him MD Electronically signed by Fransico Him MD Signature Date/Time: 06/27/2020/11:01:08 AM    Final    CT Angio Chest/Abd/Pel for Dissection W and/or Wo Contrast  Result Date: 06/26/2020 CLINICAL DATA:  Abdominal pain, chest pain and nausea. EXAM: CT ANGIOGRAPHY CHEST, ABDOMEN AND PELVIS TECHNIQUE: Non-contrast CT of the chest was initially obtained. Multidetector CT imaging through the chest, abdomen and pelvis was performed using the standard protocol during bolus administration of intravenous contrast. Multiplanar reconstructed images and MIPs were obtained and reviewed to evaluate the vascular anatomy. CONTRAST:  161mL OMNIPAQUE IOHEXOL 350 MG/ML  SOLN COMPARISON:  January 19, 2012 FINDINGS: CTA CHEST FINDINGS Cardiovascular: An extensive amount of intraluminal low attenuation is seen involving numerous right middle lobe, upper lobe and lower lobe branches of the bilateral pulmonary arteries. Saddle embolus is also seen. There is mild cardiomegaly with enlargement of the right ventricle noted. No pericardial effusion. Mediastinum/Nodes: No enlarged mediastinal, hilar, or axillary lymph nodes. Thyroid gland, trachea, and esophagus demonstrate no significant findings. Lungs/Pleura: Lungs are clear. No pleural effusion or pneumothorax. Musculoskeletal: Marked severity multilevel degenerative changes seen throughout the thoracic spine with marked severity scoliosis. Review of the MIP images confirms the above findings. CTA ABDOMEN AND PELVIS FINDINGS VASCULAR Aorta: Normal caliber aorta without aneurysm, dissection, vasculitis or significant stenosis. Celiac: Patent without evidence of aneurysm, dissection, vasculitis or significant stenosis. SMA: Patent without evidence of aneurysm, dissection, vasculitis or significant stenosis. Renals: Both renal arteries are patent without evidence of aneurysm, dissection, vasculitis, fibromuscular dysplasia or significant stenosis. IMA: Patent without evidence of aneurysm, dissection, vasculitis or significant stenosis. Inflow: Patent without evidence of aneurysm, dissection, vasculitis or significant stenosis. Veins: No obvious venous abnormality within the limitations of this arterial phase study. Review of the MIP images confirms the above findings. NON-VASCULAR Hepatobiliary: No focal liver abnormality is seen. No gallstones, gallbladder wall thickening, or biliary dilatation. Pancreas: Unremarkable. No pancreatic ductal dilatation or surrounding inflammatory changes. Spleen: Normal in size without focal abnormality. Adrenals/Urinary Tract: There is mild diffuse enlargement of the bilateral adrenal glands. A 2.2 cm x 1.9  cm low-attenuation left adrenal mass is also seen. Kidneys are normal in size, without renal calculi or hydronephrosis. A 1.9 cm diameter simple cyst is seen within the posterolateral aspect of the mid left kidney. Bladder is unremarkable. Stomach/Bowel: There is a small hiatal hernia. Appendix appears normal. No evidence of bowel wall thickening, distention, or inflammatory changes. Noninflamed diverticula are seen throughout the sigmoid colon. Lymphatic: No abnormal abdominal or pelvic lymph nodes are seen. Reproductive: Prostate is unremarkable. Other: There is a 2.5 cm x 2.2 cm fat containing umbilical hernia. No abdominopelvic ascites. Musculoskeletal: Marked severity multilevel degenerative changes seen throughout the lumbar spine with marked severity scoliosis. Review of the MIP images confirms the above findings. IMPRESSION: 1. Extensive bilateral pulmonary emboli with saddle embolus and associated right heart strain. 2. 2.2 cm x 1.9 cm low-attenuation left adrenal mass which may represent an adrenal adenoma. 3. Noninflamed sigmoid diverticulosis. 4. Marked severity multilevel degenerative changes throughout the thoracic and lumbar spine with marked severity scoliosis. Electronically Signed   By: Virgina Norfolk  M.D.   On: 06/26/2020 20:47   VAS [place] LOWER EXTREMITY VENOUS (DVT)  Result Date: 06/27/2020  Lower Venous DVTStudy Indications: Pulmonary embolism.  Comparison Study: No prior study on file Performing Technologist: Sharion Dove RVS  Examination Guidelines: A complete evaluation includes B-mode imaging, spectral Doppler, color Doppler, and power Doppler as needed of all accessible portions of each vessel. Bilateral testing is considered an integral part of a complete examination. Limited examinations for reoccurring indications may be performed as noted. The reflux portion of the exam is performed with the patient in reverse Trendelenburg.   +---------+---------------+---------+-----------+----------+--------------+  RIGHT     Compressibility Phasicity Spontaneity Properties Thrombus Aging  +---------+---------------+---------+-----------+----------+--------------+  CFV       Full            Yes       Yes                                    +---------+---------------+---------+-----------+----------+--------------+  SFJ       Full                                                             +---------+---------------+---------+-----------+----------+--------------+  FV Prox   Full                                                             +---------+---------------+---------+-----------+----------+--------------+  FV Mid    Full                                                             +---------+---------------+---------+-----------+----------+--------------+  FV Distal Full                                                             +---------+---------------+---------+-----------+----------+--------------+  PFV       Full                                                             +---------+---------------+---------+-----------+----------+--------------+  POP       Full            Yes       Yes                                    +---------+---------------+---------+-----------+----------+--------------+  PTV       Full                                                             +---------+---------------+---------+-----------+----------+--------------+  PERO      Full                                                             +---------+---------------+---------+-----------+----------+--------------+   +---------+---------------+---------+-----------+----------+--------------+  LEFT      Compressibility Phasicity Spontaneity Properties Thrombus Aging  +---------+---------------+---------+-----------+----------+--------------+  CFV       Full            Yes       Yes                                     +---------+---------------+---------+-----------+----------+--------------+  SFJ       Full                                                             +---------+---------------+---------+-----------+----------+--------------+  FV Prox   Full                                                             +---------+---------------+---------+-----------+----------+--------------+  FV Mid    Full                                                             +---------+---------------+---------+-----------+----------+--------------+  FV Distal Full                                                             +---------+---------------+---------+-----------+----------+--------------+  PFV       Full                                                             +---------+---------------+---------+-----------+----------+--------------+  POP       None            No        Yes                    Acute           +---------+---------------+---------+-----------+----------+--------------+  PTV       Partial  Acute           +---------+---------------+---------+-----------+----------+--------------+  PERO      Partial                                          Acute           +---------+---------------+---------+-----------+----------+--------------+     Summary: RIGHT: - There is no evidence of deep vein thrombosis in the lower extremity.  LEFT: - Findings consistent with acute deep vein thrombosis involving the left popliteal vein, left posterior tibial veins, and left peroneal veins.  *See table(s) above for measurements and observations. Electronically signed by Ruta Hinds MD on 06/27/2020 at 5:21:22 PM.    Final       Subjective: No acute issues or events overnight, pain well controlled, ambulation improving, denies nausea, vomiting, diarrhea, constipation, headache, fevers, chills.   Discharge Exam: Vitals:   07/04/20 0353 07/04/20 0801  BP: 134/73 140/78  Pulse: 89 86   Resp: 18 17  Temp: 98.5 F (36.9 C) 99.3 F (37.4 C)  SpO2: 100% 100%   Vitals:   07/03/20 1752 07/03/20 2043 07/04/20 0353 07/04/20 0801  BP: 135/81 (!) 142/77 134/73 140/78  Pulse: 100 93 89 86  Resp: 19 18 18 17   Temp: 97.6 F (36.4 C) (!) 97.5 F (36.4 C) 98.5 F (36.9 C) 99.3 F (37.4 C)  TempSrc: Oral Oral Oral Oral  SpO2: 98% 100% 100% 100%  Weight:      Height:        General: Pt is alert, awake, not in acute distress Cardiovascular: RRR, S1/S2 +, no rubs, no gallops Respiratory: CTA bilaterally, no wheezing, no rhonchi Abdominal: Soft, NT, ND, bowel sounds + Extremities: no edema, no cyanosis    The results of significant diagnostics from this hospitalization (including imaging, microbiology, ancillary and laboratory) are listed below for reference.     Microbiology: Recent Results (from the past 240 hour(s))  SARS Coronavirus 2 by RT PCR (hospital order, performed in [facility] hospital lab) Nasopharyngeal Nasopharyngeal Swab     Status: None   Collection Time: 06/26/20  9:10 PM   Specimen: Nasopharyngeal Swab  Result Value Ref Range Status   SARS Coronavirus 2 NEGATIVE NEGATIVE Final    Comment: (NOTE) SARS-CoV-2 target nucleic acids are NOT DETECTED.  The SARS-CoV-2 RNA is generally detectable in upper and lower respiratory specimens during the acute phase of infection. The lowest concentration of SARS-CoV-2 viral copies this assay can detect is 250 copies / mL. A negative result does not preclude SARS-CoV-2 infection and should not be used as the sole basis for treatment or other patient management decisions.  A negative result may occur with improper specimen collection / handling, submission of specimen other than nasopharyngeal swab, presence of viral mutation(s) within the areas targeted by this assay, and inadequate number of viral copies (<250 copies / mL). A negative result must be combined with clinical observations, patient history, and  epidemiological information.  Fact Sheet for Patients:   StrictlyIdeas.no  Fact Sheet for Healthcare Providers: BankingDealers.co.za  This test is not yet approved or  cleared by the [place] FDA and has been authorized for detection and/or diagnosis of SARS-CoV-2 by FDA under an Emergency Use Authorization (EUA).  This EUA will remain in effect (meaning this test can be used) for the duration of the COVID-19 declaration under Section 564(b)(1) of the  Act, 21 U.S.C. section 360bbb-3(b)(1), unless the authorization is terminated or revoked sooner.  Performed at [facility], [place] [address], [place], [place] [address]   Culture, blood (routine x 2)     Status: None   Collection Time: 06/26/20  9:33 PM   Specimen: BLOOD  Result Value Ref Range Status   Specimen Description BLOOD CENTRAL LINE  Final   Special Requests   Final    BOTTLES DRAWN AEROBIC AND ANAEROBIC Blood Culture adequate volume   Culture   Final    NO GROWTH 6 DAYS Performed at [place] [address] [place], [place] [address]    Report Status 07/02/2020 FINAL  Final  MRSA PCR Screening     Status: None   Collection Time: 06/26/20 11:30 PM   Specimen: Nasopharyngeal  Result Value Ref Range Status   MRSA by PCR NEGATIVE NEGATIVE Final    Comment:        The GeneXpert MRSA Assay (FDA approved for NASAL specimens only), is one component of a comprehensive MRSA colonization surveillance program. It is not intended to diagnose MRSA infection nor to guide or monitor treatment for MRSA infections. Performed at [facility], [place] [address], [place],  [address]      Labs: BNP (last 3 results) Recent Labs    06/26/20 2001  BNP 86.7   Basic Metabolic Panel: Recent Labs  Lab 06/28/20 0207 06/29/20 0415 07/01/20 0338 07/03/20 0640  NA 139 137 139 137  K 3.4* 3.4* 3.1* 4.3  CL 105 101 105 107  CO2 23 22 23  19*  GLUCOSE  156* 154* 263* 121*  BUN 19 11 13 16   CREATININE 1.32* 1.20 1.16 1.12  CALCIUM 9.1 9.6 9.6 9.5  MG 2.0  --   --   --   PHOS 2.3*  --   --   --    Liver Function Tests: Recent Labs  Lab 06/29/20 0415 07/01/20 0338 07/03/20 0640  AST 59* 39 59*  ALT 80* 53* 42  ALKPHOS 91 87 78  BILITOT 0.8 0.9 0.6  PROT 7.2 7.4 6.9  ALBUMIN 3.6 3.4* 3.2*   No results for input(s): LIPASE, AMYLASE in the last 168 hours. No results for input(s): AMMONIA in the last 168 hours. CBC: Recent Labs  Lab 06/29/20 0415 06/30/20 0206 07/01/20 0338 07/02/20 0126 07/03/20 0640  WBC 7.4 7.0 6.1 6.5 9.8  HGB 10.7* 10.4* 10.4* 10.3* 9.8*  HCT 32.5* 32.6* 31.5* 31.7* 30.3*  MCV 95.6 94.8 95.2 94.6 97.4  PLT 304 337 373 344 366   Cardiac Enzymes: No results for input(s): CKTOTAL, CKMB, CKMBINDEX, TROPONINI in the last 168 hours. BNP: Invalid input(s): POCBNP CBG: Recent Labs  Lab 07/03/20 1207 07/03/20 1708 07/03/20 2043 07/04/20 0645 07/04/20 1131  GLUCAP 111* 102* 153* 101* 92   D-Dimer No results for input(s): DDIMER in the last 72 hours. Hgb A1c No results for input(s): HGBA1C in the last 72 hours. Lipid Profile No results for input(s): CHOL, HDL, LDLCALC, TRIG, CHOLHDL, LDLDIRECT in the last 72 hours. Thyroid function studies No results for input(s): TSH, T4TOTAL, T3FREE, THYROIDAB in the last 72 hours.  Invalid input(s): FREET3 Anemia work up No results for input(s): VITAMINB12, FOLATE, FERRITIN, TIBC, IRON, RETICCTPCT in the last 72 hours. Urinalysis    Component Value Date/Time   COLORURINE YELLOW 01/19/2019 1422   APPEARANCEUR HAZY (A) 01/19/2019 1422   LABSPEC 1.020 01/19/2019 1422   PHURINE 5.0 01/19/2019 1422  GLUCOSEU NEGATIVE 01/19/2019 1422   HGBUR MODERATE (A) 01/19/2019 1422   HGBUR negative 10/26/2010 0824   BILIRUBINUR NEGATIVE 01/19/2019 1422   BILIRUBINUR n 01/12/2019 1020   KETONESUR NEGATIVE 01/19/2019 1422   PROTEINUR 100 (A) 01/19/2019 1422    UROBILINOGEN 0.2 01/12/2019 1020   UROBILINOGEN 0.2 04/10/2013 1555   NITRITE NEGATIVE 01/19/2019 1422   LEUKOCYTESUR NEGATIVE 01/19/2019 1422   Sepsis Labs Invalid input(s): PROCALCITONIN,  WBC,  LACTICIDVEN Microbiology Recent Results (from the past 240 hour(s))  SARS Coronavirus 2 by RT PCR (hospital order, performed in [facility] hospital lab) Nasopharyngeal Nasopharyngeal Swab     Status: None   Collection Time: 06/26/20  9:10 PM   Specimen: Nasopharyngeal Swab  Result Value Ref Range Status   SARS Coronavirus 2 NEGATIVE NEGATIVE Final    Comment: (NOTE) SARS-CoV-2 target nucleic acids are NOT DETECTED.  The SARS-CoV-2 RNA is generally detectable in upper and lower respiratory specimens during the acute phase of infection. The lowest concentration of SARS-CoV-2 viral copies this assay can detect is 250 copies / mL. A negative result does not preclude SARS-CoV-2 infection and should not be used as the sole basis for treatment or other patient management decisions.  A negative result may occur with improper specimen collection / handling, submission of specimen other than nasopharyngeal swab, presence of viral mutation(s) within the areas targeted by this assay, and inadequate number of viral copies (<250 copies / mL). A negative result must be combined with clinical observations, patient history, and epidemiological information.  Fact Sheet for Patients:   StrictlyIdeas.no  Fact Sheet for Healthcare Providers: BankingDealers.co.za  This test is not yet approved or  cleared by the [place] FDA and has been authorized for detection and/or diagnosis of SARS-CoV-2 by FDA under an Emergency Use Authorization (EUA).  This EUA will remain in effect (meaning this test can be used) for the duration of the COVID-19 declaration under Section 564(b)(1) of the Act, 21 U.S.C. section 360bbb-3(b)(1), unless the authorization is  terminated or revoked sooner.  Performed at [facility], [place] [address] [place], [place] [address]   Culture, blood (routine x 2)     Status: None   Collection Time: 06/26/20  9:33 PM   Specimen: BLOOD  Result Value Ref Range Status   Specimen Description BLOOD CENTRAL LINE  Final   Special Requests   Final    BOTTLES DRAWN AEROBIC AND ANAEROBIC Blood Culture adequate volume   Culture   Final    NO GROWTH 6 DAYS Performed at [place] [address], [place], [place] [address]    Report Status 07/02/2020 FINAL  Final  MRSA PCR Screening     Status: None   Collection Time: 06/26/20 11:30 PM   Specimen: Nasopharyngeal  Result Value Ref Range Status   MRSA by PCR NEGATIVE NEGATIVE Final    Comment:        The GeneXpert MRSA Assay (FDA approved for NASAL specimens only), is one component of a comprehensive MRSA colonization surveillance program. It is not intended to diagnose MRSA infection nor to guide or monitor treatment for MRSA infections. Performed at [facility], [place] [address] [place], [place] [address]    Time coordinating discharge: Over 30 minutes  SIGNED:  Little Ishikawa, DO [facility] 07/04/2020, 12:01 PM Pager   If 7PM-7AM, please contact night-coverage [email or web address]

## 2020-07-05 ENCOUNTER — Encounter (HOSPITAL_COMMUNITY): Payer: Self-pay | Admitting: Orthopedic Surgery

## 2020-07-05 ENCOUNTER — Inpatient Hospital Stay (HOSPITAL_COMMUNITY): Payer: 59

## 2020-07-05 ENCOUNTER — Inpatient Hospital Stay (HOSPITAL_COMMUNITY): Payer: 59 | Admitting: Occupational Therapy

## 2020-07-05 DIAGNOSIS — G8918 Other acute postprocedural pain: Secondary | ICD-10-CM

## 2020-07-05 DIAGNOSIS — R5381 Other malaise: Secondary | ICD-10-CM

## 2020-07-05 DIAGNOSIS — I2699 Other pulmonary embolism without acute cor pulmonale: Secondary | ICD-10-CM

## 2020-07-05 DIAGNOSIS — D62 Acute posthemorrhagic anemia: Secondary | ICD-10-CM

## 2020-07-05 DIAGNOSIS — G894 Chronic pain syndrome: Secondary | ICD-10-CM

## 2020-07-05 DIAGNOSIS — E871 Hypo-osmolality and hyponatremia: Secondary | ICD-10-CM

## 2020-07-05 DIAGNOSIS — E1142 Type 2 diabetes mellitus with diabetic polyneuropathy: Secondary | ICD-10-CM

## 2020-07-05 LAB — COMPREHENSIVE METABOLIC PANEL
ALT: 32 U/L (ref 0–44)
AST: 33 U/L (ref 15–41)
Albumin: 3.3 g/dL — ABNORMAL LOW (ref 3.5–5.0)
Alkaline Phosphatase: 82 U/L (ref 38–126)
Anion gap: 12 (ref 5–15)
BUN: 12 mg/dL (ref 8–23)
CO2: 20 mmol/L — ABNORMAL LOW (ref 22–32)
Calcium: 9.9 mg/dL (ref 8.9–10.3)
Chloride: 102 mmol/L (ref 98–111)
Creatinine, Ser: 1.16 mg/dL (ref 0.61–1.24)
GFR calc Af Amer: >60 mL/min (ref >60)
GFR calc non Af Amer: >60 mL/min (ref >60)
Glucose, Bld: 167 mg/dL — ABNORMAL HIGH (ref 70–99)
Potassium: 4 mmol/L (ref 3.5–5.1)
Sodium: 134 mmol/L — ABNORMAL LOW (ref 135–145)
Total Bilirubin: 0.6 mg/dL (ref 0.3–1.2)
Total Protein: 7.4 g/dL (ref 6.5–8.1)

## 2020-07-05 LAB — GLUCOSE, CAPILLARY
Glucose-Capillary: 142 mg/dL — ABNORMAL HIGH (ref 70–99)
Glucose-Capillary: 155 mg/dL — ABNORMAL HIGH (ref 70–99)
Glucose-Capillary: 132 mg/dL — ABNORMAL HIGH (ref 70–99)
Glucose-Capillary: 114 mg/dL — ABNORMAL HIGH (ref 70–99)

## 2020-07-05 LAB — CBC WITH DIFFERENTIAL/PLATELET
Abs Immature Granulocytes: 0.09 10*3/uL — ABNORMAL HIGH (ref 0.00–0.07)
Basophils Absolute: 0 10*3/uL (ref 0.0–0.1)
Basophils Relative: 0 %
Eosinophils Absolute: 0.1 10*3/uL (ref 0.0–0.5)
Eosinophils Relative: 1 %
HCT: 32 % — ABNORMAL LOW (ref 39.0–52.0)
Hemoglobin: 10.4 g/dL — ABNORMAL LOW (ref 13.0–17.0)
Immature Granulocytes: 1 %
Lymphocytes Relative: 17 %
Lymphs Abs: 1.3 10*3/uL (ref 0.7–4.0)
MCH: 31.2 pg (ref 26.0–34.0)
MCHC: 32.5 g/dL (ref 30.0–36.0)
MCV: 96.1 fL (ref 80.0–100.0)
Monocytes Absolute: 1.2 10*3/uL — ABNORMAL HIGH (ref 0.1–1.0)
Monocytes Relative: 15 %
Neutro Abs: 5.3 10*3/uL (ref 1.7–7.7)
Neutrophils Relative %: 66 %
Platelets: 389 10*3/uL (ref 150–400)
RBC: 3.33 MIL/uL — ABNORMAL LOW (ref 4.22–5.81)
RDW: 15.3 % (ref 11.5–15.5)
WBC: 8 10*3/uL (ref 4.0–10.5)
nRBC: 0.2 % (ref 0.0–0.2)

## 2020-07-05 MED ORDER — OXYCODONE HCL 5 MG PO TABS
5.0000 mg | ORAL_TABLET | ORAL | Status: DC | PRN
  Administered 2020-07-05 – 2020-07-11 (×22): 5 mg via ORAL
  Filled 2020-07-05 (×22): qty 1

## 2020-07-05 MED ORDER — ATENOLOL 25 MG PO TABS
50.0000 mg | ORAL_TABLET | Freq: Every day | ORAL | Status: DC
  Administered 2020-07-05 – 2020-07-20 (×16): 50 mg via ORAL
  Filled 2020-07-05 (×16): qty 2

## 2020-07-05 MED ORDER — CLONIDINE HCL 0.1 MG PO TABS
0.1000 mg | ORAL_TABLET | Freq: Every day | ORAL | Status: DC
  Administered 2020-07-05 – 2020-07-20 (×16): 0.1 mg via ORAL
  Filled 2020-07-05 (×16): qty 1

## 2020-07-05 MED ORDER — CHLORTHALIDONE 25 MG PO TABS
25.0000 mg | ORAL_TABLET | Freq: Every day | ORAL | Status: DC
  Administered 2020-07-05 – 2020-07-20 (×16): 25 mg via ORAL
  Filled 2020-07-05 (×16): qty 1

## 2020-07-05 NOTE — Progress Notes (Signed)
[facility] PHYSICAL MEDICINE & REHABILITATION PROGRESS NOTE   Subjective/Complaints: Patient seen sitting up in bed this morning.  He states he did not sleep well overnight due to pain.  Discussed changing frequency of pain medications.  He is ready to begin therapies, but apprehensive.  ROS: + Left lower extremity pain.  Denies CP, SOB, N/V/D  Objective:   No results found. Recent Labs    07/03/20 0640 07/05/20 0456  WBC 9.8 8.0  HGB 9.8* 10.4*  HCT 30.3* 32.0*  PLT 366 389   Recent Labs    07/03/20 0640 07/05/20 0456  NA 137 134*  K 4.3 4.0  CL 107 102  CO2 19* 20*  GLUCOSE 121* 167*  BUN 16 12  CREATININE 1.12 1.16  CALCIUM 9.5 9.9    Intake/Output Summary (Last 24 hours) at 07/05/2020 1435 Last data filed at 07/05/2020 1300 Gross per 24 hour  Intake 1140 ml  Output 2075 ml  Net -935 ml        Physical Exam: Vital Signs Blood pressure 113/80, pulse 87, temperature 98.5 F (36.9 C), resp. rate 16, height 6' (1.829 m), weight 120.1 kg, SpO2 99 %. Constitutional: No distress . Vital signs reviewed. HENT: Normocephalic.  Atraumatic. Eyes: EOMI. No discharge. Cardiovascular: No JVD.  RRR. Respiratory: Normal effort.  No stridor.  Bilateral clear to auscultation. GI: Non-distended.  BS +. Skin: Right lower extremity with dressing CDI Psych: Normal mood.  Normal behavior. Musc: Left foot equinovalgus deformity Right lower extremity with edema and tenderness Neuro: Alert Motor: Bilateral upper extremities: 5/5 proximal distal Right lower extremity: Hip flexion, knee extension to -5/5, ankle limited due to splint, wiggles toes Left lower extremity: 4+/5 proximal distal, ankle limited due to deformity Sensation intact to light touch  Assessment/Plan: 1. Functional deficits secondary to right bimalleolar ankle fracture status post ORIF complicated by pulmonary embolism which require 3+ hours per day of interdisciplinary therapy in a comprehensive inpatient  rehab setting.  Physiatrist is providing close team supervision and 24 hour management of active medical problems listed below.  Physiatrist and rehab team continue to assess barriers to discharge/monitor patient progress toward functional and medical goals  Care Tool:  Bathing              Bathing assist       Upper Body Dressing/Undressing Upper body dressing   What is the patient wearing?: Pull over shirt    Upper body assist Assist Level: Supervision/Verbal cueing    Lower Body Dressing/Undressing Lower body dressing      What is the patient wearing?: Underwear/pull up, Pants     Lower body assist Assist for lower body dressing: Moderate Assistance - Patient 50 - 74%     Toileting Toileting    Toileting assist Assist for toileting: Independent with assistive device (urinal)     Transfers Chair/bed transfer  Transfers assist     Chair/bed transfer assist level: Moderate Assistance - Patient 50 - 74%     Locomotion Ambulation   Ambulation assist   Ambulation activity did not occur: Safety/medical concerns (R LE NWB status, generalized weakness, decreased balance, poor UE strength)          Walk 10 feet activity   Assist  Walk 10 feet activity did not occur: Safety/medical concerns (R LE NWB status, generalized weakness, decreased balance, poor UE strength)        Walk 50 feet activity   Assist Walk 50 feet with 2 turns activity did not occur:  Safety/medical concerns (R LE NWB status, generalized weakness, decreased balance, poor UE strength)         Walk 150 feet activity   Assist Walk 150 feet activity did not occur: Safety/medical concerns (R LE NWB status, generalized weakness, decreased balance, poor UE strength)         Walk 10 feet on uneven surface  activity   Assist Walk 10 feet on uneven surfaces activity did not occur: Safety/medical concerns (R LE NWB status, generalized weakness, decreased balance, poor UE  strength)         Wheelchair     Assist Will patient use wheelchair at discharge?: Yes Type of Wheelchair: Manual    Wheelchair assist level: Supervision/Verbal cueing Max wheelchair distance: 148ft    Wheelchair 50 feet with 2 turns activity    Assist        Assist Level: Supervision/Verbal cueing   Wheelchair 150 feet activity     Assist      Assist Level: Supervision/Verbal cueing   Blood pressure 113/80, pulse 87, temperature 98.5 F (36.9 C), resp. rate 16, height 6' (1.829 m), weight 120.1 kg, SpO2 99 %.  Medical Problem List and Plan: 1.  Decreased functional mobility secondary to right bimalleolar ankle fracture status post ORIF 6/00/4599 complicated by pulmonary emboli.  Nonweightbearing x6 to 8 weeks  Begin CIR evaluations 2.  Antithrombotics: -DVT/anticoagulation: Eliquis             -antiplatelet therapy: N/A 3. Pain Management/Chronic Back pain: Cymbalta 60 mg daily, oxycodone as needed.  Patient had been on oxycodone 5 to 10 mg every 6 hours as needed prior to admission as well as Flexeril and OxyContin 10 mg 3 times daily.   Oxycodone frequency increased to every 5 mg every 4 as needed 4. Mood: Provide emotional support             -antipsychotic agents: N/A 5. Neuropsych: This patient is capable of making decisions on his own behalf. 6. Skin/Wound Care: Routine skin checks 7. Fluids/Electrolytes/Nutrition: Routine in and outs 8.  Acute blood loss anemia.    Hemoglobin 10.4 on 9/28  Continue to monitor 9.  Diabetes mellitus peripheral neuropathy.  Hemoglobin A1c 6.7.  Currently on Lantus insulin 10 units nightly.  Patient on Glucophage 250 mg daily prior to admission.    Monitor with increased mobility  10.  Hyperlipidemia.  Zocor 11.  AKI.  Resolved.  Follow-up chemistries 12.  Incidental findings left adrenal mass.  Follow-up outpatient  13.  Hyponatremia  Sodium 134 9/28  Continue to monitor  LOS: 1 days A FACE TO FACE EVALUATION  WAS PERFORMED  Keith [name] Keith [name] 07/05/2020, 2:35 PM

## 2020-07-05 NOTE — Progress Notes (Signed)
[facility] Individual Statement of Services  Patient Name:  Keith [name]  Date:  07/05/2020  Welcome to the [facility].  Our goal is to provide you with an individualized program based on your diagnosis and situation, designed to meet your specific needs.  With this comprehensive rehabilitation program, you will be expected to participate in at least 3 hours of rehabilitation therapies Monday-Friday, with modified therapy programming on the weekends.  Your rehabilitation program will include the following services:  Physical Therapy (PT), Occupational Therapy (OT), 24 hour per day rehabilitation nursing, Care Coordinator, Rehabilitation Medicine, Nutrition Services and Pharmacy Services  Weekly team conferences will be held on Wednesday to discuss your progress.  Your Inpatient Rehabilitation Care Coordinator will talk with you frequently to get your input and to update you on team discussions.  Team conferences with you and your family in attendance may also be held.  Expected length of stay: 14-18 days  Overall anticipated outcome: supervision with cueing  Depending on your progress and recovery, your program may change. Your Inpatient Rehabilitation Care Coordinator will coordinate services and will keep you informed of any changes. Your Inpatient Rehabilitation Care Coordinator's name and contact numbers are listed  below.  The following services may also be recommended but are not provided by the [facility] will be made to provide these services after discharge if needed.  Arrangements include referral to agencies that provide these services.  Your insurance has been verified to be:  Medicare & Aetna Your primary doctor is:  Betty [name]  Pertinent information will be shared with your doctor and your  insurance company.  Inpatient Rehabilitation Care Coordinator:  Ovidio Kin, [place] or Emilia Beck  Information discussed with and copy given to patient by: Elease Hashimoto, 07/05/2020, 10:45 AM

## 2020-07-05 NOTE — Progress Notes (Signed)
Occupational Therapy Session Note  Patient Details  Name: Keith [name] MRN: [id number] Date of Birth: 08-Aug-1951  Today's Date: 07/05/2020 OT Individual Time: 1410-1510 OT Individual Time Calculation (min): 60 min    Short Term Goals: Week 1:  OT Short Term Goal 1 (Week 1): Pt will complete bathing with min assist OT Short Term Goal 2 (Week 1): Pt will complete LB dressing with min assist at sit > stand level OT Short Term Goal 3 (Week 1): Pt will complete toilet transfers with min assist stand pivot OT Short Term Goal 4 (Week 1): Pt will complete toileting with min assist at sit > stand level  Skilled Therapeutic Interventions/Progress Updates:  Treatment session with focus on BUE strengthening, endurance, and toilet transfers.  Pt received upright in recliner reporting desire to engaged in BUE strengthening and endurance.  Completed sit > stand and stand pivot transfer with RW with min assist, therapist providing cues for NWB during transfer.  Engaged in 10 mins on resistance 4 on UBE with 5 mins forward and 5 mins backward for strengthening and endurance.  Engaged in zoom ball activity in various planes to address strength and endurance.  Pt reports need to toilet.  Returned to room and completed stand pivot transfer w/c > BSC over toilet with RW with min assist.  Therapist assisted with clothing up and down due to decreased dynamic standing balance and tight waist band.  Therapist educated on [facility] over toilet for arm rests to increase safety and ease with sit <> stand.  Returned to recliner with min assist stand pivot with RW.  Pt remained upright in recliner with chair alarm on and all needs in reach.  Therapy Documentation Precautions:  Precautions Precautions: Fall, Other (comment) Precaution Comments: R ankle fx with ORIF Required Braces or Orthoses: Splint/Cast Splint/Cast: R cast Restrictions Weight Bearing Restrictions: Yes RLE Weight Bearing: Non weight bearing Other  Position/Activity Restrictions: R ankle soft cast General:   Vital Signs: Therapy Vitals Temp: 98.5 F (36.9 C) Pulse Rate: 87 Resp: 16 BP: 113/80 Patient Position (if appropriate): Sitting Oxygen Therapy SpO2: 99 % O2 Device: Room Air Pain: Pain Assessment Pain Scale: 0-10 Pain Score: 5  Pain Type: Surgical pain Pain Location: Ankle Pain Orientation: Right Pain Descriptors / Indicators: Aching;Discomfort Pain Frequency: Constant   Therapy/Group: Individual Therapy  Simonne Come 07/05/2020, 3:33 PM

## 2020-07-05 NOTE — Progress Notes (Signed)
Spoke with [facility] patient's atenolol 50 mg daily chlorthalidone and clonidine 0.1 mg daily has been resumed.  Monitor with up with mobility

## 2020-07-05 NOTE — Progress Notes (Signed)
Patient alert and cooperative,verbalized right surgical ankle pain rating pain 6-7/10 on pain scale, Patient states" his pain is becoming unbearable.". Explain again current medication schedule Q 6 hrs,repositioned on a pillow and ice applied,good CMS, will follow up with MD/PA. Monitor and assisted.

## 2020-07-05 NOTE — Evaluation (Signed)
Occupational Therapy Assessment and Plan  Patient Details  Name: Keith [name] MRN: [id number] Date of Birth: [birth year]  OT Diagnosis: acute pain, lumbago (low back pain), muscle weakness (generalized) and pain in joint Rehab Potential: Rehab Potential (ACUTE ONLY): Good ELOS: 14-18 days   Today's Date: 07/05/2020 OT Individual Time: 1100-1200 OT Individual Time Calculation (min): 60 min     Hospital Problem: Principal Problem:   Debility Active Problems:   Pulmonary emboli (HCC)   Hyponatremia   Diabetic peripheral neuropathy (HCC)   Acute blood loss anemia   Chronic pain syndrome   Postoperative pain   Past Medical History:  Past Medical History:  Diagnosis Date  . Allergy   . Chronic back pain   . Diabetes mellitus without complication ([place])   . Duodenal ulcer hemorrhage 08/29/2014  . ED (erectile dysfunction)   . Esophageal stricture 08/30/2014  . Glucose intolerance (impaired glucose tolerance)   . Hiatal hernia 08/30/2014  . Hypertension   . Hypokalemia 04/11/2013  . 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 Obesity   . Osteoarthritis   . Scoliosis 2016  . Spinal stenosis of lumbar region   . Urinary tract infection 04/11/2013   Past Surgical History:  Past Surgical History:  Procedure Laterality Date  . COLONOSCOPY  2008  . ESOPHAGOGASTRODUODENOSCOPY N/A 08/29/2014   Procedure: ESOPHAGOGASTRODUODENOSCOPY (EGD);  Surgeon: Lafayette Dragon, MD;  Location: [facility];  Service: Endoscopy;  Laterality: N/A;  . LAMINECTOMY N/A 07/28/2018   Procedure: THORACIC ELEVEN- THORACIC TWELVE POSTERIOR DECOMPRESSION LAMINECTOMY;  Surgeon: Judith Part, MD;  Location: [place];  Service: Neurosurgery;  Laterality: N/A;  . LUMBAR LAMINECTOMY    . NASAL POLYP SURGERY    . ORIF ANKLE FRACTURE Right 07/01/2020   Procedure: OPEN REDUCTION INTERNAL FIXATION (ORIF) ANKLE FRACTURE. TRIMALLEOLAR;  Surgeon: Marchia Bond, MD;  Location: [place] [facility];  Service: Orthopedics;  Laterality: Right;   . TONSILLECTOMY      Assessment & Plan Clinical Impression: Patient is a 69 y.o.  right-handed male with history of chronic back pain status post T11-T12 decompression 07/28/2018 maintained on OxyContin 10 mg 3 times daily as well as oxycodone 1 to 2 tablets every 6 hours as needed, hypertension, tobacco abuse, diabetes mellitus.  Patient with recent admission 3 weeks ago for acute on chronic back pain seen by neurosurgery Dr. Trenton Gammon.  MRI thoracic lumbar spine showed T11-12 cord compression with myelopathic findings despite an interval laminectomy.  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  He was placed on IV steroids with good improvement ambulating 250 feet rolling walker and was to follow-up outpatient with Dr. Venetia Constable.  Per chart review patient lives with spouse.  Two-level home 2 steps to entry.  Independent with assistive device.  Presented 06/26/2020 after reported fall related near syncopal event when he slipped on a wet floor without loss of consciousness sustaining right ankle trimalleolar fracture dorsal subluxation of the talus relative to the tibial plafond.  Small avulsion fracture dorsal distal margin of the talus.  Admission chemistries glucose 328 creatinine 2.0 BUN 24, troponin 45 hemoglobin 12.2, alcohol negative, lactic acid 2.1.  Patient also with complaints of chest pain noted to be hypoxic saturation in the 80s blood pressure in the 50s.Marland Kitchen  He was placed on nonrebreather mask as well as started on Levophed.  CT angiogram of the chest showed extensive bilateral pulmonary emboli with saddle embolus and associated right heart strain.  2.2 cm x 1.9 cm low-attenuation left adrenal mass.  Lower  extremity Dopplers acute DVT involving the left popliteal vein left posterior tibial vein and left peroneal vein.  Patient was started on intravenous heparin echocardiogram showed ejection fraction of 60 to 65% no wall motion abnormalities grade 1 diastolic dysfunction.   Elevated troponin felt to be related to demand ischemia.  Orthopedic service follow-up Dr. Mardelle Matte in regards to right bimalleolar ankle fracture patient was cleared to undergo ORIF with fixation of fibula 07/01/2020.  He is nonweightbearing for 6 to 8 weeks..  Patient's IV heparin was transitioned to Eliquis after ankle surgery for pulmonary emboli. Patient transferred to CIR on 07/04/2020 .    Patient currently requires mod-max with basic self-care skills secondary to muscle weakness, decreased cardiorespiratoy endurance and decreased standing balance, decreased balance strategies, difficulty maintaining precautions and pain.  Prior to hospitalization, patient could complete ADLs with modified independent .  Patient will benefit from skilled intervention to increase independence with basic self-care skills prior to discharge home with care partner.  Anticipate patient will require intermittent supervision and follow up home health.  OT - End of Session Activity Tolerance: Tolerates 30+ min activity with multiple rests Endurance Deficit: Yes Endurance Deficit Description: required multiple rest breaks OT Assessment Rehab Potential (ACUTE ONLY): Good OT Barriers to Discharge: Home environment access/layout;Weight bearing restrictions OT Barriers to Discharge Comments: bedroom and full bath upstairs OT Patient demonstrates impairments in the following area(s): Balance;Endurance;Motor;Pain OT Basic ADL's Functional Problem(s): Grooming;Bathing;Dressing;Toileting OT Advanced ADL's Functional Problem(s): Simple Meal Preparation OT Transfers Functional Problem(s): Toilet;Tub/Shower OT Additional Impairment(s): None OT Plan OT Intensity: Minimum of 1-2 x/day, 45 to 90 minutes OT Frequency: 5 out of 7 days OT Duration/Estimated Length of Stay: 14-18 days OT Treatment/Interventions: Medical illustrator training;Community reintegration;Discharge planning;Disease mangement/prevention;DME/adaptive equipment  instruction;Functional mobility training;Pain management;Patient/family education;Psychosocial support;Self Care/advanced ADL retraining;Splinting/orthotics;Skin care/wound managment;Therapeutic Activities;Therapeutic Exercise;UE/LE Strength taining/ROM;Wheelchair propulsion/positioning OT Basic Self-Care Anticipated Outcome(s): Supervision bathing, Mod I dressing OT Toileting Anticipated Outcome(s): Supervision OT Bathroom Transfers Anticipated Outcome(s): Supervision OT Recommendation Patient destination: Home Follow Up Recommendations: Home health OT;24 hour supervision/assistance Equipment Recommended: 3 in 1 bedside comode;Tub/shower seat   OT Evaluation Precautions/Restrictions  Precautions Precautions: Fall;Other (comment) Precaution Comments: R ankle fx with ORIF Required Braces or Orthoses: Splint/Cast Splint/Cast: R cast Restrictions Weight Bearing Restrictions: Yes RLE Weight Bearing: Non weight bearing Other Position/Activity Restrictions: R ankle soft cast Vital Signs Therapy Vitals Temp: 98.5 F (36.9 C) Pulse Rate: 87 Resp: 16 BP: 113/80 Patient Position (if appropriate): Sitting Oxygen Therapy SpO2: 99 % O2 Device: Room Air Pain Pain Assessment Pain Scale: 0-10 Pain Score: 5  Pain Type: Surgical pain Pain Location: Ankle Pain Orientation: Right Pain Descriptors / Indicators: Aching;Discomfort Pain Frequency: Constant Pain Intervention(s): Repositioned;Other (Comment) (premedicated) Home Living/Prior Functioning Home Living Living Arrangements: Spouse/significant other Available Help at Discharge: Family, Available 24 hours/day Type of Home: House Home Access: Stairs to enter [employer] of Steps: 3 Entrance Stairs-Rails: Right Home Layout: Two level, 1/2 bath on main level, Bed/bath upstairs Alternate Level Stairs-Number of Steps: 13 Alternate Level Stairs-Rails: Right Bathroom Shower/Tub: Walk-in shower (also has tub/shower) Armed forces operational officer: Handicapped height Bathroom Accessibility: Yes  Lives With: Spouse IADL History Homemaking Responsibilities: Yes Meal Prep Responsibility: Primary Laundry Responsibility: Primary Cleaning Responsibility: Primary Homemaking Comments: reports he and wife do everything together Current License: Yes Mode of Transportation: Car Occupation: Retired Prior Function Level of Independence: Independent with basic ADLs, Requires assistive device for independence (using Rollator for last 4 months, just started using SPC)  Able to Take Stairs?: Yes Driving:  Yes Vocation: Retired Vision Baseline Vision/History: Wears glasses Wears Glasses: At all times Patient Visual Report: No change from baseline Vision Assessment?: No apparent visual deficits Perception  Perception: Within Functional Limits Praxis Praxis: Intact Cognition Overall Cognitive Status: Within Functional Limits for tasks assessed Arousal/Alertness: Awake/alert Orientation Level: Person;Place;Situation Person: Oriented Place: Oriented Situation: Oriented Year: 2021 Month: September Day of Week: Correct Memory: Appears intact Immediate Memory Recall: Sock;Blue;Bed Memory Recall Sock: Without Cue Memory Recall Blue: Without Cue Memory Recall Bed: Without Cue Attention: Divided Divided Attention: Appears intact Awareness: Appears intact Problem Solving: Appears intact Safety/Judgment: Appears intact Sensation Sensation Light Touch: Appears Intact Proprioception: Impaired by gross assessment Coordination Gross Motor Movements are Fluid and Coordinated: No Fine Motor Movements are Fluid and Coordinated: Yes Coordination and Movement Description: grossly uncoordinated due to R LE NWB status, generalized weakness, decreased balance/postural control, and poor endurance Finger Nose Finger Test: [facility] bilaterally Heel Shin Test: decreased ROM on L LE, unable to perform on R LE due to cast Motor  Motor Motor:  Abnormal postural alignment and control Motor - Skilled Clinical Observations: grossly uncoordinated due to R LE NWB status, generalized weakness, decreased balance/postural control, and poor endurance  Trunk/Postural Assessment  Cervical Assessment Cervical Assessment: Within Functional Limits Thoracic Assessment Thoracic Assessment: Within Functional Limits Lumbar Assessment Lumbar Assessment: Exceptions to [facility] (posterior pelvic tilt) Postural Control Postural Control: Deficits on evaluation  Balance Balance Balance Assessed: Yes Static Sitting Balance Static Sitting - Balance Support: Feet supported;Bilateral upper extremity supported Static Sitting - Level of Assistance: 5: Stand by assistance (supervision) Dynamic Sitting Balance Dynamic Sitting - Balance Support: Bilateral upper extremity supported;Feet supported Dynamic Sitting - Level of Assistance: 5: Stand by assistance (supervision) Static Standing Balance Static Standing - Balance Support: Bilateral upper extremity supported (RW) Static Standing - Level of Assistance: 4: Min assist Dynamic Standing Balance Dynamic Standing - Balance Support: Bilateral upper extremity supported (RW) Dynamic Standing - Level of Assistance: 3: Mod assist Extremity/Trunk Assessment RUE Assessment RUE Assessment: Within Functional Limits LUE Assessment LUE Assessment: Within Functional Limits  Care Tool Care Tool Self Care Eating        Oral Care         Bathing Bathing activity did not occur: Refused            Upper Body Dressing(including orthotics)   What is the patient wearing?: Pull over shirt   Assist Level: Supervision/Verbal cueing    Lower Body Dressing (excluding footwear)   What is the patient wearing?: Underwear/pull up;Pants Assist for lower body dressing: Moderate Assistance - Patient 50 - 74%    Putting on/Taking off footwear             Care Tool Toileting Toileting activity   Assist for toileting:  Maximal Assistance - Patient 25 - 49%     Care Tool Bed Mobility Roll left and right activity   Roll left and right assist level: Supervision/Verbal cueing    Sit to lying activity        Lying to sitting edge of bed activity   Lying to sitting edge of bed assist level: Supervision/Verbal cueing     Care Tool Transfers Sit to stand transfer   Sit to stand assist level: Moderate Assistance - Patient 50 - 74%    Chair/bed transfer   Chair/bed transfer assist level: Moderate Assistance - Patient 50 - 74%     Toilet transfer   Assist Level: Moderate Assistance - Patient 50 - 74%  Care Tool Cognition Expression of Ideas and Wants Expression of Ideas and Wants: Without difficulty (complex and basic) - expresses complex messages without difficulty and with speech that is clear and easy to understand   Understanding Verbal and Non-Verbal Content Understanding Verbal and Non-Verbal Content: Understands (complex and basic) - clear comprehension without cues or repetitions   Memory/Recall Ability *first 3 days only Memory/Recall Ability *first 3 days only: Current season;Location of own room;That he or she is in a hospital/hospital unit;Staff names and faces    Refer to Care Plan for Long Term Goals  SHORT TERM GOAL WEEK 1 OT Short Term Goal 1 (Week 1): Pt will complete bathing with min assist OT Short Term Goal 2 (Week 1): Pt will complete LB dressing with min assist at sit > stand level OT Short Term Goal 3 (Week 1): Pt will complete toilet transfers with min assist stand pivot OT Short Term Goal 4 (Week 1): Pt will complete toileting with min assist at sit > stand level  Recommendations for other services: None    Skilled Therapeutic Intervention OT eval completed with discussion of rehab process, OT purpose, POC, ELOS, and goals.  Bathing and dressing completed deferred at this time as pt completed dressing during PT session.  Pt reports wife to bring additional clothing and  agreeable to bathing tomorrow.  Completed sit > stand with mod assist initially, fading to min assist throughout session.  Stand pivot transfers recliner <> w/c with min-mod assist with RW, therapist providing cues for NWB through RLE during standing and transfer.  Engaged in discussion of home making tasks in ADL apt with focus on accessing items in cabinets and refrigerator.  Engaged in [place] with 5# dowel completing 2 sets of 10 chest presses, overhead presses, and rowing.  Returned to recliner stand pivot min assist with RW.  Pt remained upright in recliner with chair alarm on and all needs in reach.   ADL ADL Eating: Independent Where Assessed-Eating: Chair Upper Body Dressing: Setup Where Assessed-Upper Body Dressing: Edge of bed Lower Body Dressing: Moderate assistance Where Assessed-Lower Body Dressing: Edge of bed Toileting: Maximal assistance Where Assessed-Toileting: Bedside Commode (over toilet) Toilet Transfer: Moderate assistance Toilet Transfer Method: Stand pivot Toilet Transfer Equipment: Bedside commode (RW) Mobility  Bed Mobility Bed Mobility: Rolling Right;Rolling Left;Sit to Supine;Supine to Sit Rolling Right: Supervision/verbal cueing Rolling Left: Supervision/Verbal cueing Supine to Sit: Supervision/Verbal cueing Transfers Sit to Stand: Moderate Assistance - Patient 50-74% Stand to Sit: Contact Guard/Touching assist   Discharge Criteria: Patient will be discharged from OT if patient refuses treatment 3 consecutive times without medical reason, if treatment goals not met, if there is a change in medical status, if patient makes no progress towards goals or if patient is discharged from hospital.  The above assessment, treatment plan, treatment alternatives and goals were discussed and mutually agreed upon: by patient  Simonne Come 07/05/2020, 12:29 PM

## 2020-07-05 NOTE — Evaluation (Signed)
Physical Therapy Assessment and Plan  Patient Details  Name: Keith [name] MRN: [id number] Date of Birth: [birth year]  PT Diagnosis: Abnormal posture, Difficulty walking, Muscle weakness and Pain in R ankle Rehab Potential: Good ELOS: 2-2.5 weeks   Today's Date: 07/05/2020 PT Individual Time: 7829-5621 PT Individual Time Calculation (min): 72 min    Hospital Problem: Principal Problem:   Pulmonary emboli [place])   Past Medical History:  Past Medical History:  Diagnosis Date   Allergy    Chronic back pain    Diabetes mellitus without complication ([place])    Duodenal ulcer hemorrhage 08/29/2014   ED (erectile dysfunction)    Esophageal stricture 08/30/2014   Glucose intolerance (impaired glucose tolerance)    Hiatal hernia 08/30/2014   Hypertension    Hypokalemia 04/11/2013   MRSA carrier 08/30/2014   Obesity    Osteoarthritis    Scoliosis 2016   Spinal stenosis of lumbar region    Urinary tract infection 04/11/2013   Past Surgical History:  Past Surgical History:  Procedure Laterality Date   COLONOSCOPY  2008   ESOPHAGOGASTRODUODENOSCOPY N/A 08/29/2014   Procedure: ESOPHAGOGASTRODUODENOSCOPY (EGD);  Surgeon: Lafayette Dragon, MD;  Location: [facility];  Service: Endoscopy;  Laterality: N/A;   LAMINECTOMY N/A 07/28/2018   Procedure: THORACIC ELEVEN- THORACIC TWELVE POSTERIOR DECOMPRESSION LAMINECTOMY;  Surgeon: Judith Part, MD;  Location: [place];  Service: Neurosurgery;  Laterality: N/A;   LUMBAR LAMINECTOMY     NASAL POLYP SURGERY     ORIF ANKLE FRACTURE Right 07/01/2020   Procedure: OPEN REDUCTION INTERNAL FIXATION (ORIF) ANKLE FRACTURE. TRIMALLEOLAR;  Surgeon: Marchia Bond, MD;  Location: [place];  Service: Orthopedics;  Laterality: Right;   TONSILLECTOMY      Assessment & Plan Clinical Impression: Patient is a 69 y.o. year old male with history of chronic back pain, status post T11-T12 decompression 07/28/2018, maintained on OxyContin  10 mg 3 times daily, as well as oxycodone 1 to 2 tablets every 6 hours as needed, hypertension, tobacco abuse, diabetes mellitus.  Patient with recent admission 3 weeks ago for acute on chronic back pain seen by neurosurgery Dr. Trenton Gammon.  MRI thoracic lumbar spine showed T11-12 cord compression with myelopathic findings despite an interval laminectomy.  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  He was placed on IV steroids with good improvement ambulating 250 feet rolling walker and was to follow-up outpatient with Dr. Venetia Constable.  Presented 06/26/2020 after reported fall related near syncopal event coming out of the bathroom. Admission chemistries glucose 328 creatinine 2.0 BUN 24, troponin 45 hemoglobin 12.2, alcohol negative, lactic acid 2.1.  Patient also with complaints of chest pain noted to be hypoxic saturation in the 80s blood pressure in the 50s.Marland Kitchen  He was placed on nonrebreather mask as well as started on Levophed.  CT angiogram of the chest showed extensive bilateral pulmonary emboli with saddle embolus and associated right heart strain.  2.2 cm x 1.9 cm low-attenuation left adrenal mass.  Lower extremity Dopplers acute DVT involving the left popliteal vein left posterior tibial vein and left peroneal vein.  Patient was started on intravenous heparin echocardiogram showed ejection fraction of 60 to 65% no wall motion abnormalities grade 1 diastolic dysfunction.  Elevated troponin felt to be related to demand ischemia. Small avulsion fracture dorsal distal margin of the talus. While pt was admitted, he slipped on the floor in the bathroom with onset of R ankle pain that worsened throughout the day, positive for swelling and tenderness to  palpation.  Orthopedic service follow-up Dr. Mardelle Matte in regards to right bimalleolar ankle fracture patient was cleared to undergo ORIF with fixation of fibula 07/01/2020.  He is nonweightbearing for 6 to 8 weeks.  Patient's IV heparin was  transitioned to Eliquis after ankle surgery for pulmonary emboli.  Therapy evaluations were completed and pt was recommended for CIR.   Patient currently requires mod with mobility secondary to muscle weakness, decreased cardiorespiratoy endurance and decreased sitting balance, decreased standing balance, decreased postural control, decreased balance strategies and difficulty maintaining precautions. Prior to hospitalization, patient was modified independent  with mobility and lived with Spouse in a House home.  Home access is 68Stairs to enter.  Patient will benefit from skilled PT intervention to maximize safe functional mobility, minimize fall risk and decrease caregiver burden for planned discharge home with 24 hour assist.  Anticipate patient will benefit from follow up [facility] at discharge.  PT - End of Session Activity Tolerance: Tolerates 30+ min activity with multiple rests Endurance Deficit: Yes Endurance Deficit Description: required multiple rest breaks PT Assessment Rehab Potential (ACUTE/IP ONLY): Good PT Barriers to Discharge: Inaccessible home environment;Home environment access/layout;Weight bearing restrictions PT Barriers to Discharge Comments: 3 STE with R handrail, flight of steps to bedroom with R handrail, NWB R LE PT Patient demonstrates impairments in the following area(s): Balance;Endurance;Motor;Pain PT Transfers Functional Problem(s): Bed Mobility;Bed to Chair;Car;Furniture PT Locomotion Functional Problem(s): Ambulation;Wheelchair Mobility;Stairs PT Plan PT Intensity: Minimum of 1-2 x/day ,45 to 90 minutes PT Frequency: 5 out of 7 days PT Duration Estimated Length of Stay: 2-2.5 weeks PT Treatment/Interventions: Ambulation/gait training;Discharge planning;Functional mobility training;Therapeutic Activities;Balance/vestibular training;Disease management/prevention;Neuromuscular re-education;Skin care/wound management;Therapeutic Exercise;Wheelchair  propulsion/positioning;DME/adaptive equipment instruction;Pain management;Splinting/orthotics;UE/LE Strength taining/ROM;Community reintegration;Functional electrical stimulation;Patient/family education;Stair training;UE/LE Coordination activities PT Transfers Anticipated Outcome(s): supervision with LRAD PT Locomotion Anticipated Outcome(s): supervision with LRAD PT Recommendation Follow Up Recommendations: Home health PT Patient destination: Home Equipment Recommended: To be determined Equipment Details: pt has rollator and cane  PT Evaluation Precautions/Restrictions Precautions Precautions: Fall;Other (comment) Precaution Comments: R ankle fx with ORIF Required Braces or Orthoses: Splint/Cast Splint/Cast: R cast Restrictions Weight Bearing Restrictions: Yes RLE Weight Bearing: Non weight bearing Other Position/Activity Restrictions: R ankle soft cast Home Living/Prior Functioning Home Living Living Arrangements: Spouse/significant other Available Help at Discharge: Family;Available 24 hours/day Type of Home: House Home Access: Stairs to enter [employer] of Steps: 3 Entrance Stairs-Rails: Right Home Layout: Two level;1/2 bath on main level;Bed/bath upstairs Alternate Level Stairs-Number of Steps: 13 Alternate Level Stairs-Rails: Right Bathroom Shower/Tub: Walk-in shower (also has tub/shower) Biochemist, clinical: Handicapped height Bathroom Accessibility: Yes  Lives With: Spouse Prior Function Level of Independence: Independent with basic ADLs;Requires assistive device for independence (using Rollator for last 4 months, just started using SPC)  Able to Take Stairs?: Yes Driving: Yes Vocation: Retired Associate Professor Overall Cognitive Status: Within Functional Limits for tasks assessed Arousal/Alertness: Awake/alert Orientation Level: Oriented X4 Memory: Appears intact Awareness: Appears intact Problem Solving: Appears intact Safety/Judgment: Appears  intact Sensation Sensation Light Touch: Appears Intact Proprioception: Impaired by gross assessment Coordination Gross Motor Movements are Fluid and Coordinated: No Fine Motor Movements are Fluid and Coordinated: Yes Coordination and Movement Description: grossly uncoordinated due to R LE NWB status, generalized weakness, decreased balance/postural control, and poor endurance Finger Nose Finger Test: [facility] bilaterally Heel Shin Test: decreased ROM on L LE, unable to perform on R LE due to cast Motor  Motor Motor: Abnormal postural alignment and control Motor - Skilled Clinical Observations: grossly uncoordinated due to R LE  NWB status, generalized weakness, decreased balance/postural control, and poor endurance  Trunk/Postural Assessment  Cervical Assessment Cervical Assessment: Within Functional Limits Thoracic Assessment Thoracic Assessment: Within Functional Limits Lumbar Assessment Lumbar Assessment: Exceptions to [facility] (posterior pelvic tilt) Postural Control Postural Control: Deficits on evaluation  Balance Balance Balance Assessed: Yes Static Sitting Balance Static Sitting - Balance Support: Feet supported;Bilateral upper extremity supported Static Sitting - Level of Assistance: 5: Stand by assistance (supervision) Dynamic Sitting Balance Dynamic Sitting - Balance Support: Bilateral upper extremity supported;Feet supported Dynamic Sitting - Level of Assistance: 5: Stand by assistance (supervision) Static Standing Balance Static Standing - Balance Support: Bilateral upper extremity supported (RW) Static Standing - Level of Assistance: 4: Min assist Dynamic Standing Balance Dynamic Standing - Balance Support: Bilateral upper extremity supported (RW) Dynamic Standing - Level of Assistance: 3: Mod assist Extremity Assessment  RLE Assessment RLE Assessment: Exceptions to [facility] General Strength Comments: grossly generalized to 3+/5 LLE Assessment LLE Assessment: Exceptions to  [facility] General Strength Comments: grossly generalized to 3+/5  Care Tool Care Tool Bed Mobility Roll left and right activity   Roll left and right assist level: Supervision/Verbal cueing    Sit to lying activity        Lying to sitting edge of bed activity   Lying to sitting edge of bed assist level: Supervision/Verbal cueing     Care Tool Transfers Sit to stand transfer   Sit to stand assist level: Moderate Assistance - Patient 50 - 74%    Chair/bed transfer   Chair/bed transfer assist level: Moderate Assistance - Patient 50 - 74%     Physiological scientist transfer assist level: Moderate Assistance - Patient 50 - 74%      Care Tool Locomotion Ambulation Ambulation activity did not occur: Safety/medical concerns (R LE NWB status, generalized weakness, decreased balance, poor UE strength)        Walk 10 feet activity Walk 10 feet activity did not occur: Safety/medical concerns (R LE NWB status, generalized weakness, decreased balance, poor UE strength)       Walk 50 feet with 2 turns activity Walk 50 feet with 2 turns activity did not occur: Safety/medical concerns (R LE NWB status, generalized weakness, decreased balance, poor UE strength)      Walk 150 feet activity Walk 150 feet activity did not occur: Safety/medical concerns (R LE NWB status, generalized weakness, decreased balance, poor UE strength)      Walk 10 feet on uneven surfaces activity Walk 10 feet on uneven surfaces activity did not occur: Safety/medical concerns (R LE NWB status, generalized weakness, decreased balance, poor UE strength)      Stairs Stair activity did not occur: Safety/medical concerns (R LE NWB status, generalized weakness, decreased balance, poor UE strength)        Walk up/down 1 step activity Walk up/down 1 step or curb (drop down) activity did not occur: Safety/medical concerns (R LE NWB status, generalized weakness, decreased balance, poor UE strength)      Walk up/down 4 steps activity did not occuR: Safety/medical concerns (R LE NWB status, generalized weakness, decreased balance, poor UE strength)  Walk up/down 4 steps activity      Walk up/down 12 steps activity Walk up/down 12 steps activity did not occur: Safety/medical concerns (R LE NWB status, generalized weakness, decreased balance, poor UE strength)      Pick up small objects from floor Pick up small object from the  floor (from standing position) activity did not occur: Safety/medical concerns (R LE NWB status, generalized weakness, decreased balance, poor UE strength)      Wheelchair Will patient use wheelchair at discharge?: Yes Type of Wheelchair: Manual   Wheelchair assist level: Supervision/Verbal cueing Max wheelchair distance: 134f  Wheel 50 feet with 2 turns activity   Assist Level: Supervision/Verbal cueing  Wheel 150 feet activity   Assist Level: Supervision/Verbal cueing    Refer to Care Plan for Long Term Goals  SHORT TERM GOAL WEEK 1 PT Short Term Goal 1 (Week 1): pt will transfer sit<>stand with LRAD CGA PT Short Term Goal 2 (Week 1): Pt will transfer stand<>pivot with LRAD CGA PT Short Term Goal 3 (Week 1): Pt will initiate gait training  Recommendations for other services: None   Skilled Therapeutic Intervention Evaluation completed (see details above and below) with education on PT POC and goals and individual treatment initiated with focus on functional mobility/transfers, generalized strengthening, dynamic standing balance/coordination, simulated car transfers, and improved activity tolerance. Received pt supine in bed, pt educated on PT evaluation, CIR policies, and therapy schedule and agreeable. Pt reported pain 5/10 in R ankle (premedicated). Repositioning and rest breaks done to reduce pain levels. Pt donned underwear and pants in supine with mod A to thread LE's through. Pt rolled L and R with supervision and use of bedrails to pull pants over hips.  Pt transferred supine<>sitting EOB with use of bedrails and supervision. Applied deodorant and pull over shirt with supervision. Pt transferred sit<>stand with RW from elevated bed with mod A and transferred stand<>pivot bed<>WC with RW and mod A while maintaining R LE NWB precautions. Pt performed WC mobility [id number] bilateral UEs and supervision to therapy gym. Therapist attempted to locate 20x18 WC for improved comfort/fit, however none available. Therapist provided pt with R elevating legrest. Pt performed simulated car transfer with RW and mod A. Noted pt with significant pes planus on L LE and pt reported he has a ankle brace that his last therapist ordered him. [facility] and transferred WC<>recliner stand<>pivot with RW min A. Pt donned L foot up brace with supervision and increased time. Concluded session with pt sitting in recliner, needs within reach, and chair pad alarm on. Safety plan updated.   Mobility Bed Mobility Bed Mobility: Rolling Right;Rolling Left;Sit to Supine;Supine to Sit Rolling Right: Supervision/verbal cueing Rolling Left: Supervision/Verbal cueing Supine to Sit: Supervision/Verbal cueing Transfers Transfers: Sit to Stand;Stand to Sit;Stand Pivot Transfers Sit to Stand: Moderate Assistance - Patient 50-74% Stand to Sit: Contact Guard/Touching assist Stand Pivot Transfers: Moderate Assistance - Patient 50 - 74% Stand Pivot Transfer Details: Verbal cues for safe use of DME/AE;Verbal cues for technique Stand Pivot Transfer Details (indicate cue type and reason): verbal cues for "pivoting" on L LE Transfer (Assistive device): Rolling walker Locomotion  Gait Ambulation: No Gait Gait: No Stairs / Additional Locomotion Stairs: No Wheelchair Mobility Wheelchair Mobility: Yes Wheelchair Assistance: SuChartered loss adjusterBoth upper extremities Wheelchair Parts Management: Needs assistance Distance:  [id number] Discharge Criteria: Patient will be discharged from PT if patient refuses treatment 3 consecutive times without medical reason, if treatment goals not met, if there is a change in medical status, if patient makes no progress towards goals or if patient is discharged from hospital.  The above assessment, treatment plan, treatment alternatives and goals were discussed and mutually agreed upon: by patient  AnnAlfonse Alpers  PT, DPT  07/05/2020, 12:23 PM

## 2020-07-05 NOTE — Progress Notes (Signed)
Patient Details  Name: Keith [name] MRN: [id number] Date of Birth: [birth year]  Today's Date: 07/05/2020  Hospital Problems: Principal Problem:   Pulmonary emboli [facility])  Past Medical History:  Past Medical History:  Diagnosis Date  . Allergy   . Chronic back pain   . Diabetes mellitus without complication ([place])   . Duodenal ulcer hemorrhage 08/29/2014  . ED (erectile dysfunction)   . Esophageal stricture 08/30/2014  . Glucose intolerance (impaired glucose tolerance)   . Hiatal hernia 08/30/2014  . Hypertension   . Hypokalemia 04/11/2013  . 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 Obesity   . Osteoarthritis   . Scoliosis 2016  . Spinal stenosis of lumbar region   . Urinary tract infection 04/11/2013   Past Surgical History:  Past Surgical History:  Procedure Laterality Date  . COLONOSCOPY  2008  . ESOPHAGOGASTRODUODENOSCOPY N/A 08/29/2014   Procedure: ESOPHAGOGASTRODUODENOSCOPY (EGD);  Surgeon: Lafayette Dragon, MD;  Location: [facility];  Service: Endoscopy;  Laterality: N/A;  . LAMINECTOMY N/A 07/28/2018   Procedure: THORACIC ELEVEN- THORACIC TWELVE POSTERIOR DECOMPRESSION LAMINECTOMY;  Surgeon: Judith Part, MD;  Location: [place];  Service: Neurosurgery;  Laterality: N/A;  . LUMBAR LAMINECTOMY    . NASAL POLYP SURGERY    . ORIF ANKLE FRACTURE Right 07/01/2020   Procedure: OPEN REDUCTION INTERNAL FIXATION (ORIF) ANKLE FRACTURE. TRIMALLEOLAR;  Surgeon: Marchia Bond, MD;  Location: [name];  Service: Orthopedics;  Laterality: Right;  . TONSILLECTOMY     Social History:  reports that he has quit smoking. He has a 25.00 pack-year smoking history. He has never used smokeless tobacco. He reports previous alcohol use. He reports that he does not use drugs.  Family / Support Systems Marital Status: Married Patient Roles: Spouse, Parent, Other (Comment) (retiree) Spouse/Significant Other: Christine [phone]  [phone] Children: Five children all spread out closest in  [place] Other Supports: Friends and church members Anticipated Caregiver: Altha Harm Ability/Limitations of Caregiver: Supervision-min level Caregiver Availability: 24/7 Family Dynamics: Close tight knit family, pt has been able to manage to be independent with his health issues. Pt feels they have good social supports and family will assist if able  Social History Preferred language: English Religion: Unknown Cultural Background: No issues Education: Some college Read: Yes Write: Yes Employment Status: Retired Public relations account executive Issues: No issues Guardian/Conservator: None-according to MD pt is capable of making his own decisions while here   Abuse/Neglect Abuse/Neglect Assessment Can Be Completed: Yes Physical Abuse: Denies Verbal Abuse: Denies Sexual Abuse: Denies Exploitation of patient/patient's resources: Denies Self-Neglect: Denies  Emotional Status Pt's affect, behavior and adjustment status: Pt is motivated to do well and recover realizes it will take time for his ankle to heal. He has had other health issues and has managed to stay independent and feels can get there again Recent Psychosocial Issues: other health issues-compression fratures in back and left ankle issues-old war injury Psychiatric History: No history deferred screening due to doing well and coping appropriately and very optimistic regarding hospitalization Substance Abuse History: Remote tobacco history  Patient / Family Perceptions, Expectations & Goals Pt/Family understanding of illness & functional limitations: Pt and wife who can explain his health issues and ankle fracture. Both talk with the MD's involved and feel aware of treatment plan going forward. Premorbid pt/family roles/activities: Husband, father, retiree, Radiographer, therapeutic, veteran, etc Anticipated changes in roles/activities/participation: resume Pt/family expectations/goals: Pt states: " I hope to be mobile when I leave here, my  wife can only do so much for me."  Wife states: " I am confident he will do what he can for himself."  [employer]: None Premorbid Home Care/DME Agencies: Other (Comment) (has rollator, elevated commode and having grab bars put in) Transportation available at discharge: wife, pt did drive prior to admission  Discharge Planning Living Arrangements: Spouse/significant other Support Systems: Spouse/significant other, Children, Other relatives, Friends/neighbors, Church/faith community Type of Residence: Private residence Insurance Resources: [employer], Multimedia programmer (specify) Scientist, clinical (histocompatibility and immunogenetics)) Financial Resources: Fish farm manager, Other (Comment) ([place]) Financial Screen Referred: No Living Expenses: Own Money Management: Patient, Spouse Does the patient have any problems obtaining your medications?: No Home Management: Wife does the home management Patient/Family Preliminary Plans: Return home with wife who can assist some but not much physical assist. P tis a large man and needs to be mobile at discharge. He is NWB on his righ leg due to fracture. Await therapy evaluations. Care Coordinator Anticipated Follow Up Needs: HH/OP  Clinical Impression Pleasant gentleman who is motivated to do well and recover. He feels much better than when he was admitted to the hospital. He has good supports and wife can assist with his care as long as not much physical care. Will await team evaluations and aware team conference tomorrow.  Elease Hashimoto 07/05/2020, 10:43 AM

## 2020-07-05 NOTE — Progress Notes (Signed)
Inpatient Rehabilitation  Patient information reviewed and entered into eRehab system by Shardae Kleinman M. Stormy Connon, M.A., CCC/SLP, PPS Coordinator.  Information including medical coding, functional ability and quality indicators will be reviewed and updated through discharge.    

## 2020-07-05 NOTE — Progress Notes (Signed)
Keith Ribas, MD  Physician  Physical Medicine and Rehabilitation  PMR Pre-admission     Signed  Date of Service:  07/04/2020 11:57 AM      Related encounter: ED to Hosp-Admission (Discharged) from 06/26/2020 in [place]       Show:Clear all '[x]' Manual'[x]' Template'[x]' Copied  Added by: '[x]' Raulkar, Clide Deutscher, MD'[x]' Michel Santee, PT  '[]' Hover for details PMR Admission Coordinator Pre-Admission Assessment  Patient: Keith [name] is an 69 y.o., male MRN: [id number] DOB: 19-Apr-1951 Height: 6' (182.9 cm) Weight: 123.3 kg  Insurance Information HMO:     PPO:      PCP:      IPA:      80/20:      OTHER:  PRIMARY: Medicare A and B      Policy#: [id number]      Subscriber: pt CM Name:       Phone#:      Fax#:  Pre-Cert#: verified Civil engineer, contracting:  Benefits:  Phone #:      Name:  Eff. Date: A 09/07/17, B 07/09/19     Deduct: $1484      Out of Pocket Max: n/a      Life Max: n/a CIR: 100%      SNF: 20 full days Outpatient: 80%     Co-Pay: 20% Home Health: 100%      Co-Pay:  DME: 80%     Co-Pay: 20% Providers:  SECONDARY:       Policy#:      Phone#:   Development worker, community:       Phone#:   The "Data Collection Information Summary" for patients in Inpatient Rehabilitation Facilities with attached "Privacy Act [place] Records" was provided and verbally reviewed with: Patient  Emergency Contact Information         Contact Information    Name Relation Home Work Mobile   [name],Keith Spouse [phone]  [phone]      Current Medical History  Patient Admitting Diagnosis: saddle PE and R trimalleolar fracture History of Present Illness: Keith [name] is a 69 year old right-handed male with history of chronic back pain,status post T11-T12 decompression 07/28/2018, maintained on OxyContin 10 mg 3 times daily, as well as oxycodone 1 to 2 tablets every 6 hours as needed,  hypertension, tobacco abuse, diabetes mellitus. Patient with recent admission 3 weeks ago for acute on chronic back pain seen by neurosurgery Dr. Trenton Gammon. MRI thoracic lumbar spine showed T11-12 cord compression with myelopathic findings despite an interval laminectomy. 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 He was placed on IV steroids with good improvement ambulating 250 feet rolling walker and was to follow-up outpatient with Dr. Venetia Constable. Presented 06/26/2020 after reported fall related near syncopal event coming out of the bathroom. Admission chemistries glucose 328 creatinine 2.0 BUN 24, troponin 45 hemoglobin 12.2, alcohol negative, lactic acid 2.1. Patient also with complaints of chest pain noted to be hypoxic saturation in the 80s blood pressure in the 50s.Marland Kitchen He was placed on nonrebreather mask as well as started on Levophed. CT angiogram of the chest showed extensive bilateral pulmonary emboli with saddle embolus and associated right heart strain. 2.2 cm x 1.9 cm low-attenuation left adrenal mass. Lower extremity Dopplers acute DVT involving the left popliteal vein left posterior tibial vein and left peroneal vein. Patient was started on intravenous heparin echocardiogram showed ejection fraction of 60 to 65% no wall motion  abnormalities grade 1 diastolic dysfunction. Elevated troponin felt to be related to demand ischemia. Small avulsion fracture dorsal distal margin of the talus. While pt was admitted, he slipped on the floor in the bathroom with onset of R ankle pain that worsened throughout the day, positive for swelling and tenderness to palpation. Orthopedic service follow-up Dr. Mardelle Matte in regards to right bimalleolar ankle fracture patient was cleared to undergo ORIF with fixation of fibula 07/01/2020. He is nonweightbearing for 6 to 8 weeks. Patient's IV heparin was transitioned to Eliquis after ankle surgery for pulmonary emboli.  Therapy evaluations were  completed and pt was recommended for CIR.   Patient's medical record from [facility] has been reviewed by the rehabilitation admission coordinator and physician.  Past Medical History      Past Medical History:  Diagnosis Date  . Allergy   . Chronic back pain   . Diabetes mellitus without complication ([place])   . Duodenal ulcer hemorrhage 08/29/2014  . ED (erectile dysfunction)   . Esophageal stricture 08/30/2014  . Glucose intolerance (impaired glucose tolerance)   . Hiatal hernia 08/30/2014  . Hypertension   . Hypokalemia 04/11/2013  . 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 Obesity   . Osteoarthritis   . Scoliosis 2016  . Spinal stenosis of lumbar region   . Urinary tract infection 04/11/2013    Family History   family history includes Asthma in his mother; Diabetes in his mother; Hypertension in his father; Stroke in his father.  Prior Rehab/Hospitalizations Has the patient had prior rehab or hospitalizations prior to admission? Yes  Has the patient had major surgery during 100 days prior to admission? Yes             Current Medications  Current Facility-Administered Medications:  .  0.45 % NaCl with KCl 20 mEq / L infusion, , Intravenous, Continuous, Ventura Bruns, PA-C, Last Rate: 75 mL/hr at 07/03/20 1620, New Bag at 07/03/20 1620 .  [COMPLETED] apixaban (ELIQUIS) tablet 10 mg, 10 mg, Oral, BID, 10 mg at 07/03/20 2041 **FOLLOWED BY** apixaban (ELIQUIS) tablet 5 mg, 5 mg, Oral, BID, Little Ishikawa, MD, 5 mg at 07/04/20 0931 .  bisacodyl (DULCOLAX) suppository 10 mg, 10 mg, Rectal, Daily PRN, Owens Shark, Blaine K, PA-C .  diphenhydrAMINE (BENADRYL) 12.5 MG/5ML elixir 12.5-25 mg, 12.5-25 mg, Oral, Q4H PRN, Owens Shark, Blaine K, PA-C .  docusate sodium (COLACE) capsule 100 mg, 100 mg, Oral, BID PRN, Merlene Pulling K, PA-C .  DULoxetine (CYMBALTA) DR capsule 60 mg, 60 mg, Oral, Daily, Little Ishikawa, MD, 60 mg at 07/04/20 0931 .  insulin aspart (novoLOG)  injection 0-5 Units, 0-5 Units, Subcutaneous, QHS, Ventura Bruns, PA-C, 3 Units at 07/01/20 2211 .  insulin aspart (novoLOG) injection 0-9 Units, 0-9 Units, Subcutaneous, TID WC, Ventura Bruns, PA-C, 1 Units at 07/02/20 [phone] .  insulin glargine (LANTUS) injection 10 Units, 10 Units, Subcutaneous, QHS, Ventura Bruns, PA-C, 10 Units at 07/03/20 2044 .  magnesium citrate solution 1 Bottle, 1 Bottle, Oral, Once PRN, Owens Shark, Blaine K, PA-C .  metoCLOPramide (REGLAN) tablet 5-10 mg, 5-10 mg, Oral, Q8H PRN **OR** metoCLOPramide (REGLAN) injection 5-10 mg, 5-10 mg, Intravenous, Q8H PRN, Owens Shark, Blaine K, PA-C .  ondansetron (ZOFRAN) tablet 4 mg, 4 mg, Oral, Q6H PRN **OR** ondansetron (ZOFRAN) injection 4 mg, 4 mg, Intravenous, Q6H PRN, Owens Shark, Blaine K, PA-C .  oxyCODONE (Oxy IR/ROXICODONE) immediate release tablet 5 mg, 5 mg, Oral, Q6H PRN, Owens Shark, Blaine K, PA-C, 5 mg  at 07/04/20 0931 .  polyethylene glycol (MIRALAX / GLYCOLAX) packet 17 g, 17 g, Oral, Daily PRN, Owens Shark, Blaine K, PA-C .  simvastatin (ZOCOR) tablet 40 mg, 40 mg, Oral, QHS, Little Ishikawa, MD, 40 mg at 07/03/20 2041  Patients Current Diet:     Diet Order                  Diet regular Room service appropriate? Yes; Fluid consistency: Thin  Diet effective now                  Precautions / Restrictions Precautions Precautions: Fall Precaution Comments: r ankle fx with orif Restrictions Weight Bearing Restrictions: Yes RLE Weight Bearing: Non weight bearing Other Position/Activity Restrictions: L LE wrapped   Has the patient had 2 or more falls or a fall with injury in the past year? Yes  Prior Activity Level Limited Community (1-2x/wk): had been less active in the last 6 months due to back pain, was using a SPC, looking forward to getting back into travel once covid slowed down, has a cruise planned for the last 2 weeks in October  Prior Functional Level Self Care: Did the patient need help bathing,  dressing, using the toilet or eating? Independent  Indoor Mobility: Did the patient need assistance with walking from room to room (with or without device)? Independent  Stairs: Did the patient need assistance with internal or external stairs (with or without device)? Independent  Functional Cognition: Did the patient need help planning regular tasks such as shopping or remembering to take medications? [place] Devices/Equipment: Cane (specify quad or straight), Walker (specify type) (Straight cane, 4 wheel walker with brakes) Home Equipment: Walker - 4 wheels, Cane - single point, Grab bars - toilet, Grab bars - tub/shower, Bedside commode, Adaptive equipment, Wheelchair - manual, Shower seat  Prior Device Use: Indicate devices/aids used by the patient prior to current illness, exacerbation or injury? 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 did  not want to get OOB today) General  ADL Comments: ADLs sitting EOB    Mobility  Overal bed mobility: Needs Assistance Bed Mobility: Supine to Sit Rolling: Modified independent (Device/Increase time) Sidelying to sit: Min guard Supine to sit: Supervision Sit to supine: Min assist, HOB elevated General bed mobility comments: Close supervision, use of bed rail, increased time/effort and use of momentum    Transfers  Overall transfer level: Needs assistance Equipment used: Rolling walker (2 wheeled) Transfers: Sit to/from Stand, [employer] Transfers Sit to Stand: Min assist, +2 physical assistance Stand pivot transfers: Mod assist, +2 physical assistance General transfer comment: MinA + 2 to power up into standing;  cues for hand/right foot placement. ModA + 2 for pivot towards left to chair. Cues for sequencing/direction. Good maintenance of weightbearing precautions    Ambulation / Gait / Stairs / Wheelchair Mobility  Ambulation/Gait General Gait Details: Unable to take complete hop on L foot, reliant on scooting L foot to pivot to recliner    Posture / Balance Dynamic Sitting Balance Sitting balance - Comments: pt able to sit EOB without assist Balance Overall balance assessment: Needs assistance Sitting-balance support: No upper extremity supported Sitting balance-Leahy Scale: Good Sitting balance - Comments: pt able to sit EOB without assist Standing balance support: Bilateral upper extremity supported Standing balance-Leahy Scale: Poor Standing balance comment: Reliant on BUE support    Special needs/care consideration Diabetic management yes and Designated visitor Alver Sorrow (spouse)   Previous Environmental health practitioner (from acute therapy documentation) Living Arrangements: Spouse/significant other Available Help at Discharge: Family, Available 24 hours/day Type of Home: House Home Layout: Two level Alternate Level Stairs-Rails: Right Alternate Level Stairs-Number of Steps: 13 Home Access: Stairs  to enter Entrance Stairs-Rails: Right Entrance Stairs-Number of Steps: 2 Bathroom Shower/Tub: Multimedia programmer: Handicapped height Bathroom Accessibility: Yes How Accessible: Accessible via walker [place]: No  Discharge Living Setting Plans for Discharge Living Setting: Patient's home Type of Home at Discharge: House Discharge Home Layout: Two level, 1/2 bath on main level, Bed/bath upstairs Alternate Level Stairs-Number of Steps: full flight Discharge Home Access: Stairs to enter Entrance Stairs-Rails: None Entrance Stairs-Number of Steps: 3 Discharge Bathroom Shower/Tub: Tub/shower unit Discharge Bathroom Toilet: Standard Discharge Bathroom Accessibility: Yes How Accessible: Accessible via walker Does the patient have any problems obtaining your medications?: No  Social/Family/Support Systems Patient Roles: Spouse Anticipated Caregiver: spouse, Engineer, manufacturing Information: [phone] (cell) [phone] (home) Ability/Limitations of Caregiver: min assist Caregiver Availability: 24/7 Discharge Plan Discussed with Primary Caregiver: Yes Is Caregiver In Agreement with Plan?: Yes Does Caregiver/Family have Issues with Lodging/Transportation while Pt is in Rehab?: No  Goals Patient/Family Goal for Rehab: PT/OT mod I, SLP n/a Expected length of stay: 14-16 days Additional Information: 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 I met with patient at the bedside for  inpatient rehabilitation assessment.  Patient will benefit from ongoing PT and OT, can actively participate in 3 hours of therapy a day 5 days of the week, and can make measurable gains during the admission.  Patient will also benefit from the coordinated team approach during an Inpatient Acute Rehabilitation admission.  The patient will receive intensive therapy as well as Rehabilitation physician, nursing, social worker, and care management interventions.  Due to safety, disease management, medication administration, pain management and patient education the patient requires 24 hour a day rehabilitation nursing.  The patient is currently min to mod +2 with mobility and basic ADLs.  Discharge setting and therapy post discharge at home with home health is anticipated.  Patient has agreed to participate in the Acute Inpatient Rehabilitation Program and will admit today.  Preadmission Screen Completed By:  Michel Santee, PT, DPT 07/04/2020 11:57 AM ______________________________________________________________________   Discussed status  with Dr. Ranell Patrick on 07/04/20  at 12:04 PM  and received approval for admission today.  Admission Coordinator:  Michel Santee, PT, DPT time 12:04 PM Sudie Grumbling 07/04/20    Assessment/Plan: Diagnosis: Avulsion fracture of dorsal distal margin of talus 1. Does the need for close, 24 hr/day Medical supervision in concert with the patient's rehab needs make it unreasonable for this patient to be served in a less intensive setting? Yes 2. Co-Morbidities requiring supervision/potential complications: chronic back pain s/p T11-T12 decompression 10/19, hyperglycemia, elevated troponin, pulmonary embolus, multiple left lower extremity DVTs 3. Due to bladder management, bowel management, safety, skin/wound care, disease management, medication administration, pain management and patient education, does the patient require 24 hr/day rehab nursing? Yes 4. Does the patient require  coordinated care of a physician, rehab nurse, PT, OT to address physical and functional deficits in the context of the above medical diagnosis(es)? Yes Addressing deficits in the following areas: balance, endurance, locomotion, strength, transferring, bowel/bladder control, bathing, dressing, feeding, grooming, toileting, swallowing and psychosocial support 5. Can the patient actively participate in an intensive therapy program of at least 3 hrs of therapy 5 days a week? Yes 6. The potential for patient to make measurable gains while on inpatient rehab is good 7. Anticipated functional outcomes upon discharge from inpatient rehab: modified independent PT, modified independent OT, independent SLP 8. Estimated rehab length of stay to reach the above functional goals is: 16-18 days 9. Anticipated discharge destination: Home 10. Overall Rehab/Functional Prognosis: excellent   MD Signature: Leeroy Cha, MD        Revision History                     Note Details  Author Keith Ribas, MD File Time 07/04/2020 12:13 PM  Author Type Physician Status Signed  Last Editor Keith Ribas, MD Service Physical Medicine and [place] # [id number] Admit Date 07/04/2020

## 2020-07-05 NOTE — Discharge Instructions (Addendum)
Inpatient Rehab Discharge Instructions  Keith [name] Discharge date and time: No discharge date for patient encounter.   Activities/Precautions/ Functional Status: Activity: Nonweightbearing right lower extremity Diet: diabetic diet Wound Care: Routine skin checks Functional status:  ___ No restrictions     ___ Walk up steps independently ___ 24/7 supervision/assistance   ___ Walk up steps with assistance ___ Intermittent supervision/assistance  ___ Bathe/dress independently ___ Walk with walker     _x__ Bathe/dress with assistance ___ Walk Independently    ___ Shower independently ___ Walk with assistance    ___ Shower with assistance ___ No alcohol     ___ Return to work/school ________  Special Instructions: No driving smoking or alcohol   COMMUNITY REFERRALS UPON DISCHARGE:    Outpatient: PT             Agency: [facility]  Phone: [place] [phone]             Appointment Date/Time:WILL CALL TO SET UP [place] Equipment/Items Ordered:WHEELCHAIR, [place] 3 IN 1                                                 Agency/Supplier:[facility]  [phone]   My questions have been answered and I understand these instructions. I will adhere to these goals and the provided educational materials after my discharge from the hospital.  Patient/Caregiver Signature _______________________________ Date __________  Clinician Signature _______________________________________ Date __________  Please bring this form and your medication list with you to all your follow-up doctor's appointments.      Information on my medicine - ELIQUIS (apixaban)  Why was Eliquis prescribed for you? Eliquis was prescribed to treat blood clots that may have been found in the veins of your legs (deep vein thrombosis) or in your lungs (pulmonary embolism) and to reduce the risk of them  occurring again.  What do You need to know about Eliquis ? The dose is  ONE 5 mg tablet taken TWICE daily.  Eliquis may be taken with or without food.   Try to take the dose about the same time in the morning and in the evening. If you have difficulty swallowing the tablet whole please discuss with your pharmacist how to take the medication safely.  Take Eliquis exactly as prescribed and DO NOT stop taking Eliquis without talking to the doctor who prescribed the medication.  Stopping may increase your risk of developing a new blood clot.  Refill your prescription before you run out.  After discharge, you should have regular check-up appointments with your healthcare provider that is prescribing your Eliquis.    What do you do if you miss a dose? If a dose of ELIQUIS is not taken at the scheduled time, take it as soon as possible on the same day and twice-daily administration should be resumed. The dose should not be doubled to make up for a missed dose.  Important Safety Information A possible side effect of Eliquis is bleeding. You should call your healthcare provider right away if you experience any of the following: ? Bleeding  from an injury or your nose that does not stop. ? Unusual colored urine (red or dark brown) or unusual colored stools (red or black). ? Unusual bruising for unknown reasons. ? A serious fall or if you hit your head (even if there is no bleeding).  Some medicines may interact with Eliquis and might increase your risk of bleeding or clotting while on Eliquis. To help avoid this, consult your healthcare provider or pharmacist prior to using any new prescription or non-prescription medications, including herbals, vitamins, non-steroidal anti-inflammatory drugs (NSAIDs) and supplements.  This website has more information on Eliquis (apixaban): http://[email or web address].com/eliquis/home

## 2020-07-06 ENCOUNTER — Inpatient Hospital Stay (HOSPITAL_COMMUNITY): Payer: 59

## 2020-07-06 ENCOUNTER — Inpatient Hospital Stay (HOSPITAL_COMMUNITY): Payer: 59 | Admitting: Occupational Therapy

## 2020-07-06 ENCOUNTER — Inpatient Hospital Stay (HOSPITAL_COMMUNITY): Payer: 59 | Admitting: Physical Therapy

## 2020-07-06 DIAGNOSIS — K5903 Drug induced constipation: Secondary | ICD-10-CM

## 2020-07-06 LAB — GLUCOSE, CAPILLARY
Glucose-Capillary: 142 mg/dL — ABNORMAL HIGH (ref 70–99)
Glucose-Capillary: 170 mg/dL — ABNORMAL HIGH (ref 70–99)
Glucose-Capillary: 143 mg/dL — ABNORMAL HIGH (ref 70–99)
Glucose-Capillary: 116 mg/dL — ABNORMAL HIGH (ref 70–99)

## 2020-07-06 MED ORDER — SENNOSIDES-DOCUSATE SODIUM 8.6-50 MG PO TABS
1.0000 | ORAL_TABLET | Freq: Every day | ORAL | Status: DC
  Administered 2020-07-06: 1 via ORAL
  Filled 2020-07-06: qty 1

## 2020-07-06 MED ORDER — POLYETHYLENE GLYCOL 3350 17 G PO PACK
17.0000 g | PACK | Freq: Two times a day (BID) | ORAL | Status: DC
  Administered 2020-07-06 – 2020-07-11 (×5): 17 g via ORAL
  Filled 2020-07-06 (×22): qty 1

## 2020-07-06 NOTE — Progress Notes (Addendum)
[place] PHYSICAL MEDICINE & REHABILITATION PROGRESS NOTE   Subjective/Complaints: Patient seen sitting up in his chair working with therapy this morning.  He states he slept well overnight.  He states he had a good first day of therapies yesterday.  He notes improvement in pain.  However, he states he is constipated and requests increase in bowel meds.    ROS: + Constipation.  Denies CP, SOB, N/V/D  Objective:   No results found. Recent Labs    07/05/20 0456  WBC 8.0  HGB 10.4*  HCT 32.0*  PLT 389   Recent Labs    07/05/20 0456  NA 134*  K 4.0  CL 102  CO2 20*  GLUCOSE 167*  BUN 12  CREATININE 1.16  CALCIUM 9.9    Intake/Output Summary (Last 24 hours) at 07/06/2020 1126 Last data filed at 07/06/2020 0800 Gross per 24 hour  Intake 540 ml  Output 1075 ml  Net -535 ml        Physical Exam: Vital Signs Blood pressure 121/75, pulse 98, temperature 99.6 F (37.6 C), resp. rate 18, height 6' (1.829 m), weight 120.1 kg, SpO2 96 %. Constitutional: No distress . Vital signs reviewed. HENT: Normocephalic.  Atraumatic. Eyes: EOMI. No discharge. Cardiovascular: No JVD.  RRR. Respiratory: Normal effort.  No stridor.  Bilateral clear to auscultation. GI: Non-distended.  BS +. Skin: Right lower extremity with dressing CDI Psych: Normal mood.  Normal behavior. Musc: Left foot with equinovalgus deformity Right lower extremity with edema and tenderness Neuro: Alert Motor: Bilateral upper extremities: 5/5 proximal distal Right lower extremity: Hip flexion, knee extension 3-/5, ankle limited due to splint, wiggles toes Left lower extremity: 4+/5 proximal distal, ankle limited due to deformity Sensation intact to light touch  Assessment/Plan: 1. Functional deficits secondary to right bimalleolar ankle fracture status post ORIF complicated by pulmonary embolism which require 3+ hours per day of interdisciplinary therapy in a comprehensive inpatient rehab  setting.  Physiatrist is providing close team supervision and 24 hour management of active medical problems listed below.  Physiatrist and rehab team continue to assess barriers to discharge/monitor patient progress toward functional and medical goals  Care Tool:  Bathing  Bathing activity did not occur: Refused Body parts bathed by patient: Right arm, Left arm, Chest, Abdomen, Front perineal area, Right upper leg, Left upper leg, Face   Body parts bathed by helper: Buttocks, Right lower leg, Left lower leg     Bathing assist Assist Level: Moderate Assistance - Patient 50 - 74%     Upper Body Dressing/Undressing Upper body dressing   What is the patient wearing?: Pull over shirt    Upper body assist Assist Level: Set up assist    Lower Body Dressing/Undressing Lower body dressing      What is the patient wearing?: Underwear/pull up, Pants     Lower body assist Assist for lower body dressing: Moderate Assistance - Patient 50 - 74%     Toileting Toileting    Toileting assist Assist for toileting: Independent     Transfers Chair/bed transfer  Transfers assist     Chair/bed transfer assist level: Moderate Assistance - Patient 50 - 74%     Locomotion Ambulation   Ambulation assist   Ambulation activity did not occur: Safety/medical concerns (R LE NWB status, generalized weakness, decreased balance, poor UE strength)          Walk 10 feet activity   Assist  Walk 10 feet activity did not occur: Safety/medical concerns (R  LE NWB status, generalized weakness, decreased balance, poor UE strength)        Walk 50 feet activity   Assist Walk 50 feet with 2 turns activity did not occur: Safety/medical concerns (R LE NWB status, generalized weakness, decreased balance, poor UE strength)         Walk 150 feet activity   Assist Walk 150 feet activity did not occur: Safety/medical concerns (R LE NWB status, generalized weakness, decreased balance, poor  UE strength)         Walk 10 feet on uneven surface  activity   Assist Walk 10 feet on uneven surfaces activity did not occur: Safety/medical concerns (R LE NWB status, generalized weakness, decreased balance, poor UE strength)         Wheelchair     Assist Will patient use wheelchair at discharge?: Yes Type of Wheelchair: Manual    Wheelchair assist level: Supervision/Verbal cueing Max wheelchair distance: 184ft    Wheelchair 50 feet with 2 turns activity    Assist        Assist Level: Supervision/Verbal cueing   Wheelchair 150 feet activity     Assist      Assist Level: Supervision/Verbal cueing   Blood pressure 121/75, pulse 98, temperature 99.6 F (37.6 C), resp. rate 18, height 6' (1.829 m), weight 120.1 kg, SpO2 96 %.  Medical Problem List and Plan: 1.  Decreased functional mobility secondary to right bimalleolar ankle fracture status post ORIF 03/04/7823 complicated by pulmonary emboli.  Nonweightbearing x6 to 8 weeks  Continue CIR  Team conference today to discuss current and goals and coordination of care, home and environmental barriers, and discharge planning with nursing, case manager, and therapies. Please see conference note from today as well.  2.  Antithrombotics: -DVT/anticoagulation: Eliquis             -antiplatelet therapy: N/A 3. Pain Management/Chronic Back pain: Cymbalta 60 mg daily, oxycodone as needed.  Patient had been on oxycodone 5 to 10 mg every 6 hours as needed prior to admission as well as Flexeril and OxyContin 10 mg 3 times daily.   Oxycodone frequency increased to every 5 mg every 4 as needed  Controlled with meds on 9/29 4. Mood: Provide emotional support             -antipsychotic agents: N/A 5. Neuropsych: This patient is capable of making decisions on his own behalf. 6. Skin/Wound Care: Routine skin checks 7. Fluids/Electrolytes/Nutrition: Routine in and outs 8.  Acute blood loss anemia.    Hemoglobin 10.4 on  9/28  Continue to monitor 9.  Diabetes mellitus peripheral neuropathy.  Hemoglobin A1c 6.7.  Currently on Lantus insulin 10 units nightly.  Patient on Glucophage 250 mg daily prior to admission.    Slightly elevated on 9/29, will consider restarting metformin if persistent  Monitor with increased mobility  10.  Hyperlipidemia.  Zocor 11.  AKI.  Resolved.  Follow-up chemistries 12.  Incidental findings left adrenal mass.  Follow-up outpatient  13.  Hyponatremia  Sodium 134 on 9/28  Continue to monitor 14. Drug induced constipation  Bowel meds increased on 9/29   LOS: 2 days A FACE TO FACE EVALUATION WAS PERFORMED  Atasha Colebank Lorie Phenix 07/06/2020, 11:26 AM

## 2020-07-06 NOTE — Progress Notes (Signed)
Patient ID: LOGIN MUCKLEROY, male   DOB: 1951-05-27, 69 y.o.   MRN: [id number]  Met with pt to discuss team conference goals supervision level and target discharge 10/13. He feels this will give him enough time to accomplish what he needs to do before going home. He has multiple medical issues going on and will need all of the time he can here.

## 2020-07-06 NOTE — Patient Care Conference (Signed)
Inpatient RehabilitationTeam Conference and Plan of Care Update Date: 07/06/2020   Time: 11:30 AM    Patient Name: Keith [name]      Medical Record Number: [id number]  Date of Birth: 1951-05-07 Sex: Male         Room/Bed: 4W22C/4W22C-01 Payor Info: Payor: MEDICARE / Plan: MEDICARE PART A AND B / Product Type: *No Product type* /    Admit Date/Time:  07/04/2020  4:39 PM  Primary Diagnosis:  [facility] Problems: Principal Problem:   Debility Active Problems:   Pulmonary emboli (HCC)   Hyponatremia   Diabetic peripheral neuropathy (HCC)   Acute blood loss anemia   Chronic pain syndrome   Postoperative pain   Drug induced constipation    Expected Discharge Date: Expected Discharge Date: 07/20/20  Team Members Present: Physician leading conference: Dr. Delice Lesch Care Coodinator Present: Dorien Chihuahua, RN, BSN, CRRN;Other (comment) Jacqlyn Larsen [place], SW) Nurse Present: Other (comment) Demetrios Loll, RN) PT Present: Becky Sax, PT OT Present: Simonne Come, OT PPS Coordinator present : Gunnar Fusi, SLP     Current Status/Progress Goal Weekly Team Focus  Bowel/Bladder   Patient is continent of bladder /Bowel LBM 07/03/2020,adminstered prn Miralax anp prune juice tonight  Maintain continence  Assess QS/PRN toileting needs and address cocnerns within a timely manner with f/u   Swallow/Nutrition/ Hydration             ADL's   limited self-care completed on eval, Mod A LB dressing, Mod A sit > stand, Min A stand pivot transfer with RW  Supervision transfers, Mod I bathing and dressing  ADL retraining, functional transfers, BUE strengthening, endurance, dynamic standing balance   Mobility   bed mobility supervision, transfers with RW min A, WC mobility 153ft supervision  supervision  functional mobility/transfers, generalized strengthening, pt education, dynamic standing balance/coordination, ambulation, and improved activity tolerance.   Communication              Safety/Cognition/ Behavioral Observations            Pain   s/p surgical intervention to right ankle fracture. Pain score 6-7/10 on pain scale. Oxycodone IR 5 mg increased to Q4-hrs  < = 3/10  QS/PRN assessment and prn   Skin               Discharge Planning:  Home with wife who is able to assist with his care. Pt is deconditioned and needs time to get stronger which makes NWB difficult   Team Discussion: Pain and constipation addressed per MD. CBGs elevated; MD to restart home med. Recheck sodium level.  Patient on target to meet rehab goals: yes, supervision goals except for min assist with steps  *See Care Plan and progress notes for long and short-term goals.   Revisions to Treatment Plan:   Teaching Needs: Transfers, toileting, medications, weight bearing precautions, etc.   Current Barriers to Discharge: Decreased caregiver support, Home enviroment access/layout and Weight bearing restrictions  Possible Resolutions to Barriers: Family education     Medical Summary Current Status: Decreased functional mobility secondary to right bimalleolar ankle fracture status post ORIF 04/16/6268 complicated by pulmonary emboli.  Barriers to Discharge: Medical stability;Pending Surgery;Weight bearing restrictions   Possible Resolutions to [employer]: Therapies, follow labs - Na+, Hb, optimize bowel meds, optimize DM meds, optimize pain meds, anticoagulation for PE   Continued Need for Acute Rehabilitation Level of Care: The patient requires daily medical management by a physician with specialized training in physical medicine and  rehabilitation for the following reasons: Direction of a multidisciplinary physical rehabilitation program to maximize functional independence : Yes Medical management of patient stability for increased activity during participation in an intensive rehabilitation regime.: Yes Analysis of laboratory values and/or radiology reports with any  subsequent need for medication adjustment and/or medical intervention. : Yes   I attest that I was present, lead the team conference, and concur with the assessment and plan of the team.   Dorien Chihuahua B 07/06/2020, 2:07 PM

## 2020-07-06 NOTE — Progress Notes (Signed)
Occupational Therapy Session Note  Patient Details  Name: Keith [name] MRN: [id number] Date of Birth: November 10, 1950  Today's Date: 07/06/2020 OT Individual Time: [phone] OT Individual Time Calculation (min): 60 min    Short Term Goals: Week 1:  OT Short Term Goal 1 (Week 1): Pt will complete bathing with min assist OT Short Term Goal 2 (Week 1): Pt will complete LB dressing with min assist at sit > stand level OT Short Term Goal 3 (Week 1): Pt will complete toilet transfers with min assist stand pivot OT Short Term Goal 4 (Week 1): Pt will complete toileting with min assist at sit > stand level  Skilled Therapeutic Interventions/Progress Updates:    Treatment session with focus on self-care retraining, functional transfers, sit > stand, and BUE strengthening.  Pt received in bed agreeable to bathing and dressing this session.  Engaged in bed mobility to EOB with supervision.  Pt completed bathing from EOB with setup assist and CGA when completing lateral leans to wash buttocks.  Pt donned pants seated EOB with lateral leans and up to standing to pull underwear and pants over hips. Pt able to pull underwear over hips with mod assist for standing balance and then therapist assisted with pulling pants over hips due to fatigue and decreased standing balance/tolerance while maintaining NWB through RLE.  Completed stand pivot transfers throughout session with min assist with RW.  Engaged in sit > stand x5 with pt able to complete with CGA to Min assist depending on fatigue and LLE stability.  Engaged in [place] with 4# dumbbells with focus on triceps with overhead presses, chest pulls, and tricep extension 2 sets of 10.  Pt returned to recliner stand pivot with RW with min assist.  Pt remained upright in reclined with all needs in reach and chair alarm on.  Therapy Documentation Precautions:  Precautions Precautions: Fall, Other (comment) Precaution Comments: R ankle fx with  ORIF Required Braces or Orthoses: Splint/Cast Splint/Cast: R cast Restrictions Weight Bearing Restrictions: Yes RLE Weight Bearing: Non weight bearing Other Position/Activity Restrictions: R ankle soft cast General:   Vital Signs:   Pain: Pain Assessment Pain Scale: 0-10 Pain Score: 3  Pain Type: Surgical pain Pain Location: Ankle Pain Orientation: Right ADL: ADL Eating: Independent Where Assessed-Eating: Chair Upper Body Dressing: Setup Where Assessed-Upper Body Dressing: Edge of bed Lower Body Dressing: Moderate assistance Where Assessed-Lower Body Dressing: Edge of bed Toileting: Maximal assistance Where Assessed-Toileting: Bedside Commode (over toilet) Toilet Transfer: Moderate assistance Toilet Transfer Method: Stand pivot Toilet Transfer Equipment: Bedside commode (RW) Vision   Perception    Praxis   Exercises:   Other Treatments:     Therapy/Group: Individual Therapy  Simonne Come 07/06/2020, 12:03 PM

## 2020-07-06 NOTE — Progress Notes (Signed)
Physical Therapy Session Note  Patient Details  Name: Keith [name] MRN: [id number] Date of Birth: 02/19/1951  Today's Date: 07/06/2020 PT Individual Time: 1035-1129 PT Individual Time Calculation (min): 54 min   Short Term Goals: Week 1:  PT Short Term Goal 1 (Week 1): pt will transfer sit<>stand with LRAD CGA PT Short Term Goal 2 (Week 1): Pt will transfer stand<>pivot with LRAD CGA PT Short Term Goal 3 (Week 1): Pt will initiate gait training  Skilled Therapeutic Interventions/Progress Updates: Pt presents sitting in recliner and agreeable to therapy.  Pt transfers sit to stand w/ min and RW and performs SPT to w/c w/ min A but verbal cues for maintaining NWB RLE.  Pt wheeled to gym for time conservation.  Pt performed UBE at level 4 for 5' forward and 5' backwards w/o trunk support and cues for maintaining trunk positioning.  Pt required rest break between trials.  Pt performed multiple sit to stand transfers in RW to perform standing knee flexion RLE 3 x 10, as well as standing to improve SLE balance.  Pt negotiated w/c w/ supervision 150' back to room.  Pt performed SPT w/ RW and min A w/c > bed and then bed > recliner.  Seat alarm on and all needs in reach.     Therapy Documentation Precautions:  Precautions Precautions: Fall, Other (comment) Precaution Comments: R ankle fx with ORIF Required Braces or Orthoses: Splint/Cast Splint/Cast: R cast Restrictions Weight Bearing Restrictions: Yes RLE Weight Bearing: Non weight bearing Other Position/Activity Restrictions: R ankle soft cast General:   Vital Signs:   Pain:3/10 R ankle. Pain Assessment Pain Scale: 0-10 Pain Score: 3  Pain Type: Surgical pain Pain Location: Ankle Pain Orientation: Right    Therapy/Group: Individual Therapy  Ladoris Gene 07/06/2020, 12:01 PM

## 2020-07-06 NOTE — Progress Notes (Signed)
Physical Therapy Session Note  Patient Details  Name: Keith [name] MRN: [id number] Date of Birth: 04-02-1951  Today's Date: 07/06/2020 PT Individual Time: 1300-1410 PT Individual Time Calculation (min): 70 min   Short Term Goals: Week 1:  PT Short Term Goal 1 (Week 1): pt will transfer sit<>stand with LRAD CGA PT Short Term Goal 2 (Week 1): Pt will transfer stand<>pivot with LRAD CGA PT Short Term Goal 3 (Week 1): Pt will initiate gait training  Skilled Therapeutic Interventions/Progress Updates:   Received pt sitting in recliner, pt agreeable to therapy, and denied any pain during session but reported muscle spasms in R LE. Session with emphasis of functional mobility/transfers, generalized strengthening, dynamic standing balance/coordination, ambulation, and improved activity tolerance. Pt transferred stand<>pivot recliner<>WC with RW and min A. Pt transported to therapy gym in [facility] total A for time management and transferred sit<>stand in // bars with CGA and ambulated 68ft x 2 trials with CGA. Pt with decreased L foot clearance and occasionally demonstrating TDWB on R LE despite cues. Pt transferred WC<>mat stand<>pivot with RW min A and worked on dynamic standing balance tossing horseshoes x 2 trials with CGA/min A while maintaining R LE NWB precautions. Pt performed the following exercises sitting on mat with supervision and verbal cues for technique: -tricep extensions 2x8 -trunk rotation with 4.4lb medicine ball x10 bilaterally -horizontal chest press with 4.4lb medicine ball x10 -overhead shoulder press with 4.4lb medicine ball x10  -hip flexion x10 with 4lb ankle weight and x10 with 3lb ankle weight -LAQ 2x10 with 3lb ankle weight Pt transferred sit<>stand with RW x 6 reps with CGA demonstrating occasional TDWB on R LE. Stand<>pivot mat<>WC with RW and min A. Pt transported back to room in [facility] total A and transferred WC<>recliner stand<>pivot with RW min A. Concluded session with pt  sitting in recliner, needs within reach, and chair pad alarm on. RN notified of water intake and pt's request for stool softener.   Therapy Documentation Precautions:  Precautions Precautions: Fall, Other (comment) Precaution Comments: R ankle fx with ORIF Required Braces or Orthoses: Splint/Cast Splint/Cast: R cast Restrictions Weight Bearing Restrictions: Yes RLE Weight Bearing: Non weight bearing Other Position/Activity Restrictions: R ankle soft cast   Therapy/Group: Individual Therapy Alfonse Alpers PT, DPT   07/06/2020, 7:47 AM

## 2020-07-07 ENCOUNTER — Inpatient Hospital Stay (HOSPITAL_COMMUNITY): Payer: 59 | Admitting: Occupational Therapy

## 2020-07-07 ENCOUNTER — Inpatient Hospital Stay (HOSPITAL_COMMUNITY): Payer: 59

## 2020-07-07 LAB — GLUCOSE, CAPILLARY
Glucose-Capillary: 146 mg/dL — ABNORMAL HIGH (ref 70–99)
Glucose-Capillary: 134 mg/dL — ABNORMAL HIGH (ref 70–99)
Glucose-Capillary: 131 mg/dL — ABNORMAL HIGH (ref 70–99)
Glucose-Capillary: 112 mg/dL — ABNORMAL HIGH (ref 70–99)

## 2020-07-07 MED ORDER — MAGNESIUM HYDROXIDE 400 MG/5ML PO SUSP
30.0000 mL | Freq: Once | ORAL | Status: DC
  Filled 2020-07-07 (×3): qty 30

## 2020-07-07 MED ORDER — METFORMIN HCL 500 MG PO TABS
250.0000 mg | ORAL_TABLET | Freq: Every day | ORAL | Status: DC
  Administered 2020-07-08 – 2020-07-20 (×13): 250 mg via ORAL
  Filled 2020-07-07 (×13): qty 1

## 2020-07-07 MED ORDER — SENNOSIDES-DOCUSATE SODIUM 8.6-50 MG PO TABS
2.0000 | ORAL_TABLET | Freq: Two times a day (BID) | ORAL | Status: DC
  Administered 2020-07-08 – 2020-07-20 (×19): 2 via ORAL
  Filled 2020-07-07 (×25): qty 2

## 2020-07-07 NOTE — Progress Notes (Signed)
Physical Therapy Session Note  Patient Details  Name: Keith [name] MRN: [id number] Date of Birth: 06/11/1951  Today's Date: 07/07/2020 PT Individual Time: 1300-1410 PT Individual Time Calculation (min): 70 min   Short Term Goals: Week 1:  PT Short Term Goal 1 (Week 1): pt will transfer sit<>stand with LRAD CGA PT Short Term Goal 2 (Week 1): Pt will transfer stand<>pivot with LRAD CGA PT Short Term Goal 3 (Week 1): Pt will initiate gait training  Skilled Therapeutic Interventions/Progress Updates:   Received pt sitting in recliner, pt agreeable to therapy, and denied any pain during session. Pt with L tennis shoe and lace up ankle brace donned throughout session. Session with emphasis on functional mobility/transfers, generalized strengthening, dynamic standing balance/coordination, ambulation, and improved activity tolerance. Pt transferred recliner<>WC stand<>pivot with RW and CGA while maintaining R LE NWB precautions. Pt transported to therapy gym in [facility] total A for energy conservation purposes. Pt transferred sit<>stand in // bars with CGA and ambulated 15ft x 2 trials with 4 turns with CGA. Pt with occasional TDWB on R LE due to increased fatigue. Pt reported ambulating with tennis shoe was more comfortable than with croc. Pt transported to dayroom in [facility] total A and transferred WC<>Nustep stand<>pivot with RW CGA and performed bilateral UEs and L LE strengthening on Nustep at workload 3 for 8 minutes for a total of 381 steps for improved cardiovascular endurance. Worked on dynamic standing balance playing cornhole with 1 UE support on RW x 3 trials with CGA for balance. Pt with occasional L LE spasms resulting in LOB requiring min A to correct balance. Pt required multiple rest breaks throughout session due to increased fatigue and L LE weakness. Pt performed L LE strengthening on Kinetron at 20 cm/sec for 1 minute x 3 trials with therapist providing counter resistance. Pt transported back to  room in [facility] total A and transferred WC<>recliner with RW and CGA. Concluded session with pt sitting in recliner, needs within reach, and chair pad alarm on.   Therapy Documentation Precautions:  Precautions Precautions: Fall, Other (comment) Precaution Comments: R ankle fx with ORIF Required Braces or Orthoses: Splint/Cast Splint/Cast: R cast Restrictions Weight Bearing Restrictions: Yes RLE Weight Bearing: Non weight bearing Other Position/Activity Restrictions: R ankle soft cast  Therapy/Group: Individual Therapy Alfonse Alpers PT, DPT   07/07/2020, 7:46 AM

## 2020-07-07 NOTE — Progress Notes (Signed)
Occupational Therapy Session Note  Patient Details  Name: Keith [name] MRN: [id number] Date of Birth: [birth year]  Today's Date: 07/07/2020 OT Individual Time: 1000-1059 OT Individual Time Calculation (min): 59 min    Short Term Goals: Week 1:  OT Short Term Goal 1 (Week 1): Pt will complete bathing with min assist OT Short Term Goal 2 (Week 1): Pt will complete LB dressing with min assist at sit > stand level OT Short Term Goal 3 (Week 1): Pt will complete toilet transfers with min assist stand pivot OT Short Term Goal 4 (Week 1): Pt will complete toileting with min assist at sit > stand level  Skilled Therapeutic Interventions/Progress Updates:    1;1.  Pt received in recliner agreeable to OT. Pt completes stand pivot transfer with RW and MIN A with good foot clearance during pivot steps. Pt requesting to work on BUE strengthening and endurance d/t decreased strength impacting funcitonalmobility. Pt completes 3x30 ball toss with 2.5 # wrist weights (chest bounce and overhead pass) to focus on shoulder and tricep strengthening requried for power up during sit tos tand. Pt completes 3x1 min on 1 min off beach ball volley with 4 # dowel rod for BUE endruance required for indoor w/c mobility/transfers. Exited session with pt seated in recliner, exit alarm on and call light tin reach  Therapy Documentation Precautions:  Precautions Precautions: Fall, Other (comment) Precaution Comments: R ankle fx with ORIF Required Braces or Orthoses: Splint/Cast Splint/Cast: R cast Restrictions Weight Bearing Restrictions: Yes RLE Weight Bearing: Non weight bearing Other Position/Activity Restrictions: R ankle soft cast General:   Vital Signs:   Pain: Pain Assessment Pain Score: 3  ADL: ADL Eating: Independent Where Assessed-Eating: Chair Upper Body Dressing: Setup Where Assessed-Upper Body Dressing: Edge of bed Lower Body Dressing: Moderate assistance Where Assessed-Lower Body Dressing:  Edge of bed Toileting: Maximal assistance Where Assessed-Toileting: Bedside Commode (over toilet) Toilet Transfer: Moderate assistance Toilet Transfer Method: Stand pivot Toilet Transfer Equipment: Bedside commode (RW) Vision   Perception    Praxis   Exercises:   Other Treatments:     Therapy/Group: Individual Therapy  Tonny Branch 07/07/2020, 10:15 AM

## 2020-07-07 NOTE — Progress Notes (Signed)
    Patient seen and examined. Since patient plans to leave directly for cruise if able to discharge from rehab on 07/20/20, will plan for splint removal on 10/5. I will check his wounds at that time and he will go into a cast. He will follow up in our office with Dr. Mardelle Matte once he returns from the cruise.   Ventura Bruns 07/07/2020, 11:24 AM

## 2020-07-07 NOTE — Progress Notes (Signed)
Patient had 2 large bowel movements after scheduled  laxatives this morning and refused milk of magnesia at this time.

## 2020-07-07 NOTE — Progress Notes (Signed)
° °  Patient seen and examined. Since patient plans to leave directly for cruise if able to discharge from rehab on 07/20/20, will plan for splint removal on 10/5. I will check his wounds at that time and he will go into a cast. He will follow up in our office with Dr. Mardelle Matte once he returns from the cruise.   Keith [name] 07/07/2020, 11:24 AM

## 2020-07-07 NOTE — Progress Notes (Signed)
Occupational Therapy Session Note  Patient Details  Name: Keith [name] MRN: [id number] Date of Birth: [birth year]  Today's Date: 07/07/2020 OT Individual Time: 3167-4255 OT Individual Time Calculation (min): 56 min    Short Term Goals: Week 1:  OT Short Term Goal 1 (Week 1): Pt will complete bathing with min assist OT Short Term Goal 2 (Week 1): Pt will complete LB dressing with min assist at sit > stand level OT Short Term Goal 3 (Week 1): Pt will complete toilet transfers with min assist stand pivot OT Short Term Goal 4 (Week 1): Pt will complete toileting with min assist at sit > stand level  Skilled Therapeutic Interventions/Progress Updates:  Patient met lying supine in bed in agreement with OT treatment session with focus on self-care re-education, ADL transfers, and there ex as detailed below. Supine to EOB with HOB elevated and use of bedrail. Seated EOB, patient completed UB bathing/dressing. Patient able to thread R and L leg through underwear/pants seated EOB in figure-4 position but required assist to hike pants over hips in standing with BUE supported on RW. Occasional cues to maintain NWB precautions in RLE. Wc mobility to therapy gym with one rest break 2/2 fatigue. Stand-pivot transfer from wc to mat table with use of RW and cues for technique. Seated on mat table, patient completed BUE HEP with use of 5lb weighted dowel for 1 set x20 reps each. Total assist for wc transport back to room for time management. Session concluded with patient seated in recliner with call bell within reach and all needs met.   Therapy Documentation Precautions:  Precautions Precautions: Fall, Other (comment) Precaution Comments: R ankle fx with ORIF Required Braces or Orthoses: Splint/Cast Splint/Cast: R cast Restrictions Weight Bearing Restrictions: Yes RLE Weight Bearing: Non weight bearing Other Position/Activity Restrictions: R ankle soft cast General:    Therapy/Group: Individual  Therapy  Keith [name] 07/07/2020, 7:25 AM

## 2020-07-07 NOTE — Progress Notes (Signed)
[place] PHYSICAL MEDICINE & REHABILITATION PROGRESS NOTE   Subjective/Complaints: Patient seen sitting up in his chair this morning.  He states he slept well overnight.  He is aware of discharge date.  He states he still has not had a bowel movement.  ROS: + Constipation.  Denies CP, SOB, N/V/D  Objective:   No results found. Recent Labs    07/05/20 0456  WBC 8.0  HGB 10.4*  HCT 32.0*  PLT 389   Recent Labs    07/05/20 0456  NA 134*  K 4.0  CL 102  CO2 20*  GLUCOSE 167*  BUN 12  CREATININE 1.16  CALCIUM 9.9    Intake/Output Summary (Last 24 hours) at 07/07/2020 1512 Last data filed at 07/07/2020 1300 Gross per 24 hour  Intake 1012 ml  Output 1260 ml  Net -248 ml        Physical Exam: Vital Signs Blood pressure 105/80, pulse 84, temperature 98.7 F (37.1 C), resp. rate 18, height 6' (1.829 m), weight 120.1 kg, SpO2 100 %. Constitutional: No distress . Vital signs reviewed. HENT: Normocephalic.  Atraumatic. Eyes: EOMI. No discharge. Cardiovascular: No JVD.  RRR. Respiratory: Normal effort.  No stridor.  Bilateral clear to auscultation. GI: Non-distended.  BS +. Skin: Right lower extremity with dressing CDI Psych: Normal mood.  Normal behavior. Musc: Left foot with equina valgus deformity Right lower extremity with edema and tenderness Neuro: Alert Motor: Bilateral upper extremities: 5/5 proximal distal Right lower extremity: Hip flexion, knee extension 3-/5, ankle limited due to splint, wiggles toes, unchanged Left lower extremity: 4+/5 proximal distal, ankle limited due to deformity  Assessment/Plan: 1. Functional deficits secondary to right bimalleolar ankle fracture status post ORIF complicated by pulmonary embolism which require 3+ hours per day of interdisciplinary therapy in a comprehensive inpatient rehab setting.  Physiatrist is providing close team supervision and 24 hour management of active medical problems listed below.  Physiatrist and  rehab team continue to assess barriers to discharge/monitor patient progress toward functional and medical goals  Care Tool:  Bathing  Bathing activity did not occur: Refused Body parts bathed by patient: Right arm, Left arm, Chest, Abdomen, Front perineal area, Right upper leg, Left upper leg, Face   Body parts bathed by helper: Buttocks, Right lower leg, Left lower leg     Bathing assist Assist Level: Moderate Assistance - Patient 50 - 74%     Upper Body Dressing/Undressing Upper body dressing   What is the patient wearing?: Pull over shirt    Upper body assist Assist Level: Set up assist    Lower Body Dressing/Undressing Lower body dressing      What is the patient wearing?: Underwear/pull up, Pants     Lower body assist Assist for lower body dressing: Moderate Assistance - Patient 50 - 74%     Toileting Toileting    Toileting assist Assist for toileting: Independent with assistive device Assistive Device Comment: urinal   Transfers Chair/bed transfer  Transfers assist     Chair/bed transfer assist level: Contact Guard/Touching assist Chair/bed transfer assistive device: Programmer, multimedia   Ambulation assist   Ambulation activity did not occur: Safety/medical concerns (R LE NWB status, generalized weakness, decreased balance, poor UE strength)  Assist level: Contact Guard/Touching assist Assistive device: Parallel bars Max distance: 87ft   Walk 10 feet activity   Assist  Walk 10 feet activity did not occur: Safety/medical concerns (R LE NWB status, generalized weakness, decreased balance, poor UE strength)  Assist level: Contact Guard/Touching assist Assistive device: Parallel bars   Walk 50 feet activity   Assist Walk 50 feet with 2 turns activity did not occur: Safety/medical concerns (R LE NWB status, generalized weakness, decreased balance, poor UE strength)         Walk 150 feet activity   Assist Walk 150 feet activity  did not occur: Safety/medical concerns (R LE NWB status, generalized weakness, decreased balance, poor UE strength)         Walk 10 feet on uneven surface  activity   Assist Walk 10 feet on uneven surfaces activity did not occur: Safety/medical concerns (R LE NWB status, generalized weakness, decreased balance, poor UE strength)         Wheelchair     Assist Will patient use wheelchair at discharge?: Yes Type of Wheelchair: Manual    Wheelchair assist level: Supervision/Verbal cueing Max wheelchair distance: 172ft    Wheelchair 50 feet with 2 turns activity    Assist        Assist Level: Supervision/Verbal cueing   Wheelchair 150 feet activity     Assist      Assist Level: Supervision/Verbal cueing   Blood pressure 105/80, pulse 84, temperature 98.7 F (37.1 C), resp. rate 18, height 6' (1.829 m), weight 120.1 kg, SpO2 100 %.  Medical Problem List and Plan: 1.  Decreased functional mobility secondary to right bimalleolar ankle fracture status post ORIF 6/65/9935 complicated by pulmonary emboli.  Nonweightbearing x6 to 8 weeks  Continue CIR 2.  Antithrombotics: -DVT/anticoagulation: Eliquis             -antiplatelet therapy: N/A 3. Pain Management/Chronic Back pain: Cymbalta 60 mg daily, oxycodone as needed.  Patient had been on oxycodone 5 to 10 mg every 6 hours as needed prior to admission as well as Flexeril and OxyContin 10 mg 3 times daily.   Oxycodone frequency increased to every 5 mg every 4 as needed  Controlled with meds on 9/30 4. Mood: Provide emotional support             -antipsychotic agents: N/A 5. Neuropsych: This patient is capable of making decisions on his own behalf. 6. Skin/Wound Care: Routine skin checks 7. Fluids/Electrolytes/Nutrition: Routine in and outs 8.  Acute blood loss anemia.    Hemoglobin 10.4 on 9/28  Continue to monitor 9.  Diabetes mellitus peripheral neuropathy.  Hemoglobin A1c 6.7.  Currently on Lantus insulin  10 units nightly.  Patient on Glucophage 250 mg daily prior to admission.    Metformin 250 daily started on 10/1  Slightly elevated elevated on 9/30  Monitor with increased mobility  10.  Hyperlipidemia.  Zocor 11.  AKI.  Resolved.  Follow-up chemistries 12.  Incidental findings left adrenal mass.  Follow-up outpatient  13.  Hyponatremia  Sodium 134 on 9/28  Continue to monitor 14. Drug induced constipation  Bowel meds increased on 9/29, increased again on 9/30   LOS: 3 days A FACE TO FACE EVALUATION WAS PERFORMED  Jayel Scaduto Lorie Phenix 07/07/2020, 3:12 PM

## 2020-07-08 ENCOUNTER — Inpatient Hospital Stay (HOSPITAL_COMMUNITY): Payer: 59 | Admitting: Occupational Therapy

## 2020-07-08 ENCOUNTER — Inpatient Hospital Stay (HOSPITAL_COMMUNITY): Payer: 59

## 2020-07-08 LAB — GLUCOSE, CAPILLARY
Glucose-Capillary: 146 mg/dL — ABNORMAL HIGH (ref 70–99)
Glucose-Capillary: 110 mg/dL — ABNORMAL HIGH (ref 70–99)
Glucose-Capillary: 118 mg/dL — ABNORMAL HIGH (ref 70–99)
Glucose-Capillary: 126 mg/dL — ABNORMAL HIGH (ref 70–99)

## 2020-07-08 NOTE — Progress Notes (Signed)
Physical Therapy Session Note  Patient Details  Name: Keith [name] MRN: [id number] Date of Birth: 07/27/1951  Today's Date: 07/08/2020 PT Individual Time: 1300-1355 and 9379-0240  PT Individual Time Calculation (min): 55 min and 32 min  Short Term Goals: Week 1:  PT Short Term Goal 1 (Week 1): pt will transfer sit<>stand with LRAD CGA PT Short Term Goal 2 (Week 1): Pt will transfer stand<>pivot with LRAD CGA PT Short Term Goal 3 (Week 1): Pt will initiate gait training  Skilled Therapeutic Interventions/Progress Updates:   Treatment Session 1: 9735-3299 55 min Received pt sitting in WC, pt agreeable to therapy, and denied any pain during session. Session with emphasis on functional mobility/transfers, generalized strengthening, dynamic standing balance/coordination, gait training, and improved activity tolerance. Discussed home entry and pt stated his brother in law will be building a ramp that will be ready at d/c. Pt performed WC mobility 117ft using bilateral UEs and supervision to therapy gym. Pt transferred sit<>stand with min A and ambulated 68ft with RW with min A using gait belt to hold R LE in air to maintain R LE NWB precautions. Pt reported increased difficulty holding gait belt but was able to maintain R LE NWB precautions throughout. Discussed options for having second person hold gait belt when ambulating at home and pt stated he will always have someone with him. Pt ambulated additional 80ft with RW and R LE weight bearing alarm shoe with min A. Pt with multiple instances of TDWB on R LE despite pt saying he didn't feel like we was putting weight through his LE. Pt had R LE weight bearing alarm shoe donned throughout remainder of session for auditory feedback. Pt transferred stand<>pivot WC<>mat with RW and CGA. Worked on dynamic standing balance tapping R LE to 3 cones 2x6 reps with CGA/min A for balance. Pt transferred sit<>stand with RW from mat x 3 trials with min A and worked  on Teacher, early years/pre pictures with pegboard. Pt with difficulty copying pattern and more focused on quickly placing pegs into holes due to difficulty maintaining balance. Pt with sudden sharp "electric shock" in L LE resulting in posterior LOB onto mat. Pt reported the shock comes on suddenly without warning but noticed it occurs more with fatigue. Discussed safety strategies and importance of recognizing sings of fatigue and avoiding standing/ambulating when he feels fatigue set in; pt in agreement. Pt transferred mat<>WC stand<>pivot with min A and transported back to room in [facility] total A. Concluded session with pt sitting in [facility] with all needs within reach.   Treatment Session 2: 2426-8341 32 min  Received pt sitting in WC, pt agreeable to therapy, and denied any pain during session but reported muscle strain in R thigh from intensive therapies today. Session with emphasis on functional mobility/transfers, generalized strengthening, dynamic standing balance/coordination, and improved activity tolerance. Pt transported to dayroom in [facility] total A for energy conservation purposes and transferred WC<>Nustep stand<>pivot with RW and min A with cues to maintain R LE NWB precautions. Pt performed bilateral UE and L LE strengthening on Nustep at workload 5 for 10 minutes for 516 steps for improved cardiovascular endurance. Pt transferred stand<>pivot Nustep<>WC stand<>pivot with min A and transported back to room in [facility] total A and transferred WC<>recliner stand<>pivot with RW min A. Therapist placed WC cushion in recliner for pressure relief and ease when standing from low surfaces. Concluded session with pt sitting in recliner, needs within reach, and chair pad alarm on.  Therapy Documentation  Precautions:  Precautions Precautions: Fall, Other (comment) Precaution Comments: R ankle fx with ORIF Required Braces or Orthoses: Splint/Cast Splint/Cast: R cast Restrictions Weight Bearing  Restrictions: Yes RLE Weight Bearing: Non weight bearing Other Position/Activity Restrictions: R ankle soft cast  Therapy/Group: Individual Therapy Alfonse Alpers PT, DPT   07/08/2020, 7:32 AM

## 2020-07-08 NOTE — Progress Notes (Signed)
[place] PHYSICAL MEDICINE & REHABILITATION PROGRESS NOTE   Subjective/Complaints: Patient seen sitting up working with therapy this morning.  He states he had 2 good bowel movements yesterday and feels much better.  ROS: Denies CP, SOB, N/V/D  Objective:   No results found. No results for input(s): WBC, HGB, HCT, PLT in the last 72 hours. No results for input(s): NA, K, CL, CO2, GLUCOSE, BUN, CREATININE, CALCIUM in the last 72 hours.  Intake/Output Summary (Last 24 hours) at 07/08/2020 1243 Last data filed at 07/08/2020 0732 Gross per 24 hour  Intake 720 ml  Output 850 ml  Net -130 ml        Physical Exam: Vital Signs Blood pressure 125/82, pulse 92, temperature 99 F (37.2 C), resp. rate 16, height 6' (1.829 m), weight 120.1 kg, SpO2 98 %. Constitutional: No distress . Vital signs reviewed. HENT: Normocephalic.  Atraumatic. Eyes: EOMI. No discharge. Cardiovascular: No JVD.  RRR. Respiratory: Normal effort.  No stridor.  Bilateral clear to auscultation. GI: Non-distended.  BS +. Skin: Right lower extremity with dressing CDI Psych: Normal mood.  Normal behavior. Musc: Left foot with equinovalgus deformity Right lower extremity with edema and tenderness Neuro: Alert Motor: Bilateral upper extremities: 5/5 proximal distal Right lower extremity: Hip flexion, knee extension 3-/5, ankle limited due to splint, wiggles toes, stable Left lower extremity: 4+/5 proximal distal, ankle limited due to deformity  Assessment/Plan: 1. Functional deficits secondary to right bimalleolar ankle fracture status post ORIF complicated by pulmonary embolism which require 3+ hours per day of interdisciplinary therapy in a comprehensive inpatient rehab setting.  Physiatrist is providing close team supervision and 24 hour management of active medical problems listed below.  Physiatrist and rehab team continue to assess barriers to discharge/monitor patient progress toward functional and medical  goals  Care Tool:  Bathing  Bathing activity did not occur: Refused Body parts bathed by patient: Right arm, Left arm, Chest, Abdomen, Front perineal area, Right upper leg, Left upper leg, Face   Body parts bathed by helper: Buttocks, Right lower leg, Left lower leg     Bathing assist Assist Level: Contact Guard/Touching assist     Upper Body Dressing/Undressing Upper body dressing   What is the patient wearing?: Pull over shirt    Upper body assist Assist Level: Set up assist    Lower Body Dressing/Undressing Lower body dressing      What is the patient wearing?: Underwear/pull up, Pants     Lower body assist Assist for lower body dressing: Minimal Assistance - Patient > 75%     Toileting Toileting    Toileting assist Assist for toileting: Independent with assistive device Assistive Device Comment: urinal   Transfers Chair/bed transfer  Transfers assist     Chair/bed transfer assist level: Contact Guard/Touching assist Chair/bed transfer assistive device: Programmer, multimedia   Ambulation assist   Ambulation activity did not occur: Safety/medical concerns (R LE NWB status, generalized weakness, decreased balance, poor UE strength)  Assist level: Contact Guard/Touching assist Assistive device: Parallel bars Max distance: 35ft   Walk 10 feet activity   Assist  Walk 10 feet activity did not occur: Safety/medical concerns (R LE NWB status, generalized weakness, decreased balance, poor UE strength)  Assist level: Contact Guard/Touching assist Assistive device: Parallel bars   Walk 50 feet activity   Assist Walk 50 feet with 2 turns activity did not occur: Safety/medical concerns (R LE NWB status, generalized weakness, decreased balance, poor UE strength)  Walk 150 feet activity   Assist Walk 150 feet activity did not occur: Safety/medical concerns (R LE NWB status, generalized weakness, decreased balance, poor UE strength)          Walk 10 feet on uneven surface  activity   Assist Walk 10 feet on uneven surfaces activity did not occur: Safety/medical concerns (R LE NWB status, generalized weakness, decreased balance, poor UE strength)         Wheelchair     Assist Will patient use wheelchair at discharge?: Yes Type of Wheelchair: Manual    Wheelchair assist level: Supervision/Verbal cueing Max wheelchair distance: 129ft    Wheelchair 50 feet with 2 turns activity    Assist        Assist Level: Supervision/Verbal cueing   Wheelchair 150 feet activity     Assist      Assist Level: Supervision/Verbal cueing   Blood pressure 125/82, pulse 92, temperature 99 F (37.2 C), resp. rate 16, height 6' (1.829 m), weight 120.1 kg, SpO2 98 %.  Medical Problem List and Plan: 1.  Decreased functional mobility secondary to right bimalleolar ankle fracture status post ORIF 8/93/8101 complicated by pulmonary emboli.  Nonweightbearing x6 to 8 weeks  Continue CIR 2.  Antithrombotics: -DVT/anticoagulation: Eliquis             -antiplatelet therapy: N/A 3. Pain Management/Chronic Back pain: Cymbalta 60 mg daily, oxycodone as needed.  Patient had been on oxycodone 5 to 10 mg every 6 hours as needed prior to admission as well as Flexeril and OxyContin 10 mg 3 times daily.   Oxycodone frequency increased to every 5 mg every 4 as needed  Controlled with meds on 10/1 4. Mood: Provide emotional support             -antipsychotic agents: N/A 5. Neuropsych: This patient is capable of making decisions on his own behalf. 6. Skin/Wound Care: Routine skin checks 7. Fluids/Electrolytes/Nutrition: Routine in and outs 8.  Acute blood loss anemia.    Hemoglobin 10.4 on 9/28  Continue to monitor 9.  Diabetes mellitus peripheral neuropathy.  Hemoglobin A1c 6.7.  Currently on Lantus insulin 10 units nightly.  Patient on Glucophage 250 mg daily prior to admission.    Metformin 250 daily started on  10/1  Monitor with increased mobility  10.  Hyperlipidemia.  Zocor 11.  AKI.  Resolved.  Follow-up chemistries 12.  Incidental findings left adrenal mass.  Follow-up outpatient  13.  Hyponatremia  Sodium 134 on 9/28, labs ordered for Monday  Continue to monitor 14. Drug induced constipation  Bowel meds increased on 9/29, increased again on 9/30  Improving   LOS: 4 days A FACE TO FACE EVALUATION WAS PERFORMED  Latoya Maulding Lorie Phenix 07/08/2020, 12:43 PM

## 2020-07-08 NOTE — Progress Notes (Signed)
Occupational Therapy Session Note  Patient Details  Name: Keith [name] MRN: [id number] Date of Birth: 09-23-1951  Today's Date: 07/08/2020 OT Individual Time: 8657-8469 OT Individual Time Calculation (min): 60 min    Short Term Goals: Week 1:  OT Short Term Goal 1 (Week 1): Pt will complete bathing with min assist OT Short Term Goal 2 (Week 1): Pt will complete LB dressing with min assist at sit > stand level OT Short Term Goal 3 (Week 1): Pt will complete toilet transfers with min assist stand pivot OT Short Term Goal 4 (Week 1): Pt will complete toileting with min assist at sit > stand level  Skilled Therapeutic Interventions/Progress Updates:    Treatment session with focus on functional transfers, self-care retraining, sit <> stand, and BUE strengthening and endurance.  Pt received upright in bed expressing desire to wash up at sink.  Completed bed mobility with supervision and completed sit > stand and stand pivot transfer to w/c with RW and CGA.  Engaged in bathing seated at sink with setup for items, pt opted to not wash buttocks or lower legs this session - would require assistance for standing balance while pt washing buttocks.  Pt required assistance to thread underwear over LLE but then able to thread BLE of shorts with increased time.  Pt completed sit > stand with RW with CGA and required assistance to pull underwear and pants over hips.  Pt completed grooming tasks seated at sink without assistance.  Engaged in [place] and endurance with 10 mins (5 mins forward, 5 mins backward) on UBE resistance level 4.5.  Followed with 2 sets of 10 overhead triceps and elbow extension.  Completed stand pivot w/c > recliner with CGA with RW and left upright in recliner with all needs in reach and chair alarm on.  Therapy Documentation Precautions:  Precautions Precautions: Fall, Other (comment) Precaution Comments: R ankle fx with ORIF Required Braces or Orthoses:  Splint/Cast Splint/Cast: R cast Restrictions Weight Bearing Restrictions: Yes RLE Weight Bearing: Non weight bearing Other Position/Activity Restrictions: R ankle soft cast General:   Vital Signs: Therapy Vitals Temp: 99 F (37.2 C) Pulse Rate: 92 Resp: 16 BP: 125/82 Patient Position (if appropriate): Lying Oxygen Therapy SpO2: 98 % O2 Device: Room Air Pain: Pain Assessment Pain Scale: 0-10 Pain Score: 3  Pain Type: Surgical pain Pain Location: Leg Pain Orientation: Right Pain Descriptors / Indicators: Aching Pain Onset: On-going Pain Intervention(s): Medication (See eMAR)   Therapy/Group: Individual Therapy  Simonne Come 07/08/2020, 8:39 AM

## 2020-07-08 NOTE — Progress Notes (Signed)
Physical Therapy Session Note  Patient Details  Name: Keith [name] MRN: [id number] Date of Birth: July 23, 1951  Today's Date: 07/08/2020 PT Individual Time: 0900-1000 PT Individual Time Calculation (min): 60 min   Short Term Goals: Week 1:  PT Short Term Goal 1 (Week 1): pt will transfer sit<>stand with LRAD CGA PT Short Term Goal 2 (Week 1): Pt will transfer stand<>pivot with LRAD CGA PT Short Term Goal 3 (Week 1): Pt will initiate gait training  Skilled Therapeutic Interventions/Progress Updates:    PAIN 4/10 ankle, rest as needed  Pt initially oob in recliner and agreeable to session   STS from recliner w/mod assist, pt does not maintain NWB status w/transition.  Three steps to wc, again fails to maintain NWB but insits he has no wt on RLE.  Turn/sit w/min assist, cues for sequencing, cues to extend RLE ahead during transition to prevent wbing.    WC propulsion 157ft w/bilat UEs w/supervision.  Reviewed technique for NWBing w/gait.   Gait 29ft w/RW, cga, max cues for NWBing, pt does not maintain wbing, discontinued activity.   SPT to mat w/RW and cues for NWBing.   In sitting performed therex:  UE strengthening includingg overhead press w/3lb bar 3x15 LAQs RLE 2x12 Attempted marching in sitting - pt unable to lift RLE due to weakness.  Therapist applied wbing alarm shoe and explained use to pt.   Practiced STS from elevated mat to RW w/cues for hand placement/sequencing/and wbing.  Pt initially sets of alarm on shoe and states he is surprised as he believed he was not wbing. Repeated STS x 5 w/rest between each effort as task is arduous for pt but much improved adherence to [place] achieved w/feedback of alarm.  Pt able to maintain standing x approx 20-30 sec each effort.  Pt instructed w/SPT w/RW and maintaining NWBing.  Pt then performs SPT mat to wc w/min assist and feedback of shoe. Therapist then obtained applewood board and 3in foam cushion to decrease demand of STS and  facilitate adherence to wbing prec.  Pt sts to RW, therapist changed seating as above and pt returns to sitting adhering to wbing precs, cga.  Pt stated he felt comfortable in wc and voiced appreciation for use of alarm shoe as teaching tool for wbing.    Pt propelled wc >153ft mod I.  Pt encouraged to remain in wc vs recliner due to difficulty w/NWBing w/transferring out of recliner.  Pt agreed. Pt left oob in wc w/alarm belt set and needs in reach    Therapy Documentation Precautions:  Precautions Precautions: Fall, Other (comment) Precaution Comments: R ankle fx with ORIF Required Braces or Orthoses: Splint/Cast Splint/Cast: R cast Restrictions Weight Bearing Restrictions: Yes RLE Weight Bearing: Non weight bearing Other Position/Activity Restrictions: R ankle soft cast    Therapy/Group: Individual Therapy  Callie Fielding, PT   Jerrilyn Cairo 07/08/2020, 12:59 PM

## 2020-07-09 ENCOUNTER — Inpatient Hospital Stay (HOSPITAL_COMMUNITY): Payer: 59 | Admitting: Occupational Therapy

## 2020-07-09 ENCOUNTER — Inpatient Hospital Stay (HOSPITAL_COMMUNITY): Payer: 59 | Admitting: Physical Therapy

## 2020-07-09 LAB — GLUCOSE, CAPILLARY
Glucose-Capillary: 140 mg/dL — ABNORMAL HIGH (ref 70–99)
Glucose-Capillary: 149 mg/dL — ABNORMAL HIGH (ref 70–99)
Glucose-Capillary: 185 mg/dL — ABNORMAL HIGH (ref 70–99)
Glucose-Capillary: 103 mg/dL — ABNORMAL HIGH (ref 70–99)

## 2020-07-09 NOTE — Progress Notes (Signed)
Occupational Therapy Session Note  Patient Details  Name: Keith [name] MRN: [id number] Date of Birth: 1951/05/11  Today's Date: 07/09/2020 OT Individual Time: 5462-7035 and 1350-1430 OT Individual Time Calculation (min): 56 min and 40 min  Short Term Goals: Week 1:  OT Short Term Goal 1 (Week 1): Pt will complete bathing with min assist OT Short Term Goal 2 (Week 1): Pt will complete LB dressing with min assist at sit > stand level OT Short Term Goal 3 (Week 1): Pt will complete toilet transfers with min assist stand pivot OT Short Term Goal 4 (Week 1): Pt will complete toileting with min assist at sit > stand level  Skilled Therapeutic Interventions/Progress Updates:    Pt greeted in bed with no c/o pain, requesting to engage in bathing/dressing tasks during session. Able to state his Rt LE NWB precautions prior to mobility. Pt able to complete bed mobility unassisted while using the bedrail. While EOB he directed care for donning Lt AFO along with his shoe. Min A for stand pivot<w/c using RW, noted Rt foot made contact with floor. Education provided regarding extending Rt LE forward during sit<stand transitions with good adherence ~75% during session. Setup for UB self care while seated at sink, independent with oral care. Used the reacher for threading LEs into underwear and pants. Min A for standing balance while completing perihygiene and also Mod A to elevate LB garments over hips with questionable adherence to WB precautions during this aspect of ADL. Short distance transfer to recliner completed afterwards using RW with Min A. While seated, had pt practice doffing/donning Lt AFO + shoe. Pt able to do this without assistance and increased time, vcs for breathing out with exertion/bending forward. He used the bottom bedrail for sitting balance as needed, also the armrest of his recliner. We celebrated this accomplishment as he has not yet been able to don his Lt AFO + shoe by himself! At  end of session pt remained in the recliner with all needs within reach and chair alarm set.    2nd Session 1:1 tx (40 min) Pt greeted in the recliner with no c/o pain. Agreeable to session and ADL needs met. Min A for short distance transfer using RW to w/c parked near baseboard of bed. Note that pts Rt foot made contact with floor however pt reports he is not bearing weight in affected LE. Vcs for extending Rt LE in front of him during sit<stand transitions. He was escorted to an outdoor setting for psychosocial health. He completed multiple sit<stands with RW, focusing on centering Lt LE in RW and flexing Rt hip/knee for improving NWB adherence in standing. Pts arms visibly trembling with minimal foot clearance off of ground. Longest standing window for maintaining Rt foot clearance ~47 seconds. Continued working on UB strengthening and WB adherence via completing mini w/c push ups with Rt foot off of the ground. Pt once again with visible arm trembling due to weakness. Vcs for breathing out with exertion and activating core muscles. Longest time maintained in elevated squat ~30 seconds. He was then returned to the unit, Min A for short distance transfer back to the recliner, pt still unable to keep Rt foot off of floor but reports he is not weightbearing through limb. Pt remained in recliner, left with all needs within reach.   Therapy Documentation Precautions:  Precautions Precautions: Fall, Other (comment) Precaution Comments: R ankle fx with ORIF Required Braces or Orthoses: Splint/Cast Splint/Cast: R cast Restrictions Weight Bearing Restrictions:  Yes RLE Weight Bearing: Non weight bearing Other Position/Activity Restrictions: R ankle soft cast ADL: ADL Eating: Independent Where Assessed-Eating: Chair Upper Body Dressing: Setup Where Assessed-Upper Body Dressing: Edge of bed Lower Body Dressing: Moderate assistance Where Assessed-Lower Body Dressing: Edge of bed Toileting: Maximal  assistance Where Assessed-Toileting: Bedside Commode (over toilet) Toilet Transfer: Moderate assistance Toilet Transfer Method: Stand pivot Toilet Transfer Equipment: Bedside commode (RW)      Therapy/Group: Individual Therapy   A  07/09/2020, 12:32 PM

## 2020-07-09 NOTE — Progress Notes (Signed)
[facility] PHYSICAL MEDICINE & REHABILITATION PROGRESS NOTE   Subjective/Complaints: Working with PT No complaints  ROS: denies CP, SOB, N/V/D  Objective:   No results found. No results for input(s): WBC, HGB, HCT, PLT in the last 72 hours. No results for input(s): NA, K, CL, CO2, GLUCOSE, BUN, CREATININE, CALCIUM in the last 72 hours.  Intake/Output Summary (Last 24 hours) at 07/09/2020 1618 Last data filed at 07/09/2020 1230 Gross per 24 hour  Intake 1160 ml  Output 927 ml  Net 233 ml        Physical Exam: Vital Signs Blood pressure 117/75, pulse 83, temperature 97.9 F (36.6 C), temperature source Oral, resp. rate 18, height 6' (1.829 m), weight 120.1 kg, SpO2 100 %. General: Alert and oriented x 3, No apparent distress HEENT: Head is normocephalic, atraumatic, PERRLA, EOMI, sclera anicteric, oral mucosa pink and moist, dentition intact, ext ear canals clear,  Neck: Supple without JVD or lymphadenopathy Heart: Reg rate and rhythm. No murmurs rubs or gallops Chest: CTA bilaterally without wheezes, rales, or rhonchi; no distress Abdomen: Soft, non-tender, non-distended, bowel sounds positive. Extremities: No clubbing, cyanosis, or edema. Pulses are 2+ Skin: Right lower extremity with dressing CDI Psych: Normal mood.  Normal behavior. Musc: Left foot with equinovalgus deformity Right lower extremity with edema and tenderness Neuro: Alert Motor: Bilateral upper extremities: 5/5 proximal distal Right lower extremity: Hip flexion, knee extension 3-/5, ankle limited due to splint, wiggles toes, stable Left lower extremity: 4+/5 proximal distal, ankle limited due to deformity   Assessment/Plan: 1. Functional deficits secondary to right bimalleolar ankle fracture status post ORIF complicated by pulmonary embolism which require 3+ hours per day of interdisciplinary therapy in a comprehensive inpatient rehab setting.  Physiatrist is providing close team supervision and 24 hour  management of active medical problems listed below.  Physiatrist and rehab team continue to assess barriers to discharge/monitor patient progress toward functional and medical goals  Care Tool:  Bathing  Bathing activity did not occur: Refused Body parts bathed by patient: Right arm, Left arm, Chest, Abdomen, Front perineal area, Right upper leg, Left upper leg, Face, Buttocks   Body parts bathed by helper: Buttocks, Right lower leg, Left lower leg Body parts n/a: Right lower leg, Left lower leg   Bathing assist Assist Level: Contact Guard/Touching assist     Upper Body Dressing/Undressing Upper body dressing   What is the patient wearing?: Pull over shirt    Upper body assist Assist Level: Set up assist    Lower Body Dressing/Undressing Lower body dressing      What is the patient wearing?: Underwear/pull up, Pants     Lower body assist Assist for lower body dressing: Minimal Assistance - Patient > 75%     Toileting Toileting    Toileting assist Assist for toileting: Independent with assistive device Assistive Device Comment: urinal   Transfers Chair/bed transfer  Transfers assist     Chair/bed transfer assist level: Minimal Assistance - Patient > 75% Chair/bed transfer assistive device: Programmer, multimedia   Ambulation assist   Ambulation activity did not occur: Safety/medical concerns (R LE NWB status, generalized weakness, decreased balance, poor UE strength)  Assist level: Minimal Assistance - Patient > 75% Assistive device: Walker-rolling Max distance: 83ft   Walk 10 feet activity   Assist  Walk 10 feet activity did not occur: Safety/medical concerns (R LE NWB status, generalized weakness, decreased balance, poor UE strength)  Assist level: Minimal Assistance - Patient > 75% Assistive  device: Walker-rolling   Walk 50 feet activity   Assist Walk 50 feet with 2 turns activity did not occur: Safety/medical concerns (R LE NWB status,  generalized weakness, decreased balance, poor UE strength)         Walk 150 feet activity   Assist Walk 150 feet activity did not occur: Safety/medical concerns (R LE NWB status, generalized weakness, decreased balance, poor UE strength)         Walk 10 feet on uneven surface  activity   Assist Walk 10 feet on uneven surfaces activity did not occur: Safety/medical concerns (R LE NWB status, generalized weakness, decreased balance, poor UE strength)         Wheelchair     Assist Will patient use wheelchair at discharge?: Yes Type of Wheelchair: Manual    Wheelchair assist level: Supervision/Verbal cueing Max wheelchair distance: 181ft    Wheelchair 50 feet with 2 turns activity    Assist        Assist Level: Supervision/Verbal cueing   Wheelchair 150 feet activity     Assist      Assist Level: Supervision/Verbal cueing   Blood pressure 117/75, pulse 83, temperature 97.9 F (36.6 C), temperature source Oral, resp. rate 18, height 6' (1.829 m), weight 120.1 kg, SpO2 100 %.  Medical Problem List and Plan: 1.  Decreased functional mobility secondary to right bimalleolar ankle fracture status post ORIF 0/62/3762 complicated by pulmonary emboli.  Nonweightbearing x6 to 8 weeks  Continue CIR 2.  Antithrombotics: -DVT/anticoagulation: Eliquis             -antiplatelet therapy: N/A 3. Pain Management/Chronic Back pain: Cymbalta 60 mg daily, oxycodone as needed.  Patient had been on oxycodone 5 to 10 mg every 6 hours as needed prior to admission as well as Flexeril and OxyContin 10 mg 3 times daily.   Oxycodone frequency increased to every 5 mg every 4 as needed  Controlled 10/2 4. Mood: Provide emotional support             -antipsychotic agents: N/A 5. Neuropsych: This patient is capable of making decisions on his own behalf. 6. Skin/Wound Care: Routine skin checks 7. Fluids/Electrolytes/Nutrition: Routine in and outs 8.  Acute blood loss anemia.     Hemoglobin 10.4 on 9/28  Continue to monitor 9.  Diabetes mellitus peripheral neuropathy.  Hemoglobin A1c 6.7.  Currently on Lantus insulin 10 units nightly.  Patient on Glucophage 250 mg daily prior to admission.    Metformin 250 daily started on 10/1  10/2: slightly elevated: discussed eating less bread.   Monitor with increased mobility  10.  Hyperlipidemia.  Zocor 11.  AKI.  Resolved.  Follow-up chemistries 12.  Incidental findings left adrenal mass.  Follow-up outpatient  13.  Hyponatremia  Sodium 134 on 9/28, labs ordered for Monday  Continue to monitor 14. Drug induced constipation  Bowel meds increased on 9/29, increased again on 9/30  Improving   LOS: 5 days A FACE TO FACE EVALUATION WAS PERFORMED  Martha Clan P Jaxx Huish 07/09/2020, 4:18 PM

## 2020-07-09 NOTE — Progress Notes (Signed)
Physical Therapy Session Note  Patient Details  Name: Keith [name] MRN: [id number] Date of Birth: [birth year]  Today's Date: 07/09/2020 PT Individual Time: 1110-1210 and 4081-4481 PT Individual Time Calculation (min): 60 min and 32 min  Short Term Goals: Week 1:  PT Short Term Goal 1 (Week 1): pt will transfer sit<>stand with LRAD CGA PT Short Term Goal 2 (Week 1): Pt will transfer stand<>pivot with LRAD CGA PT Short Term Goal 3 (Week 1): Pt will initiate gait training  Skilled Therapeutic Interventions/Progress Updates:    Session 1: Pt received sitting in recliner with w/c cushion under him to help improve seat-to-floor height for safer transfers. Pt agreeable to therapy session. Discussed hx of low back surgeries resulting in sudden and unanticipated L LE "electric shocks" that cause his knee to buckle (pt aware to advocate for seated rest breaks to decrease likelihood of this occurring). Pt wears soft ankle brace on L to increase stability due to hx of ankle/foot injury. Sit>stand recliner>RW with heavy min assist for lifting into standing. R stand pivot to w/c using RW with min assist for balance and pt demoing TDWB on R LE despite cuing for NWB. Therapist educated pt on w/c part management including donning/doffing leg rests and brakes. BUE w/c propulsion ~159ft to main therapy gym with distant supervision - cuing to reach further back on wheels to improve push-to-distance ratio and increase efficiency - requires cuing for improved technique for tight turns. Pt set-up w/c for stand pivot transfer to EOM with min cuing demoing understanding of education/training on w/c parts. R stand pivot to EOM using RW with min assist for lifting and balance - continues to demonstrate instability with RW trying to move it too quickly and lift it while turning it making him more unstable and more difficult to keep R LE NWB. Donned WBing feedback shoe and adjusted sensor to maintain NWB. Sit<>stands to/from  elevated EOM using RW with heavy min assist for lifting into standing and light min assist for balance while standing - does good job of maintaining R LE NWB during this task. Standing using RW R LE hip flexion x5reps prior to fatigue requiring seated rest break then standing R LE hamstring curls x 5 reps prior to needing to sit - demos limited ROM with each exercise due to weakness. Block practice stand pivot transfers elevated EOM<>w/c using RW x2 with heavy min assist throughout - demos increased difficulty transfering towards L compared to R - cuing for pt to slow down and ensure RW properly positioning prior to attempting hop - cuing to extend R leg prior to initiating sit to improve NWB compliance - overall improving with each trial. Sit>supine on mat with CGA for safety and pt using UEs to assist L LE onto mat and increased time to lift R. Supine R LE AAROM hip flexion/knee to chest 2x12 reps - pt demos significant hip flexor weakness. Supine>sit EOM with supervision. L stand pivot to w/c using RW and min assist with pt demoing good compliance to R LE NWB. Transported back to room and pt reports need to use bathroom. Therapist educated RN on how to safely assist pt in performing stand pivot transfers w/c<>BSC over toilet using RW and proper cues to maintain R LE [place] - educated nursing staff to use +2 assist for safety. Pt left seated in [facility] over toilet with RN present.   Session 2: Pt received sitting in recliner (w/c cushion under him to improve floor-to-seat height and increase safety  of transfers) stating his UEs are fatigued from last therapy session but that he is agreeable to this session. L stand pivot transfer recliner>EOB using RW with min assist for lifting into standing and CGA/min assist for balance while turning - pt demos improving AD safety/management and increased stability when turning along with improved ability to maintain R LE NWB - pt recalling cues from AM session to improve this  transfer. Sit<>supine on bed using bedrails as needed with supervision for safety - again pt using UEs to assist L LE onto bed during sit>supine. Performed the following supine exercises focusing on strengthening:  - R LE hip abduction/adduction 2x 12 reps  - L LE single leg bridge with therapist holding up R LE to ensure NWB 2x 12 reps - pt unable to clear hips but does demo slight lift and reports feeling good glute activation  - B LE glute stes with 5 second hold x15 reps Cuing throughout for proper form/technique. Therapist provided pt with HEP printout including supine hip abduction/adduction, heel slides, and single knee to chest for patient to perform tomorrow. MD in/out for daily assessment. R stand pivot EOB>recliner using RW with min assist for lifting and CGA for steadying while turning - pt continues to demo improving AD management with increased stability - therapist continues to repeat cues from AM session to maintain R LE NWB. Pt left seated ine recliner with needs in reach and chair alarm on.   Therapy Documentation Precautions:  Precautions Precautions: Fall, Other (comment) Precaution Comments: R ankle fx with ORIF Required Braces or Orthoses: Splint/Cast Splint/Cast: R cast Restrictions Weight Bearing Restrictions: Yes RLE Weight Bearing: Non weight bearing Other Position/Activity Restrictions: R ankle soft cast   Pain:   Session 1: No complaints of pain during session.  Session 2: No reports of pain throughout session.   Therapy/Group: Individual Therapy  Tawana Scale , PT, DPT, CSRS  07/09/2020, 8:00 AM

## 2020-07-09 NOTE — Progress Notes (Signed)
Restful at interval throughout shift, verbalized his"hard day in therapy,but this is what he needed to make it home safely. Medicated frequently x 3 rating pain to surgical right foot. Patient is requesting to have his pain medication prior to therapy sessions if possible and encouraged to ask nurse for medication. No acute changes noted to skin area.Continue regime, call bell within reach, bed alarmt on

## 2020-07-10 LAB — GLUCOSE, CAPILLARY
Glucose-Capillary: 134 mg/dL — ABNORMAL HIGH (ref 70–99)
Glucose-Capillary: 103 mg/dL — ABNORMAL HIGH (ref 70–99)
Glucose-Capillary: 221 mg/dL — ABNORMAL HIGH (ref 70–99)
Glucose-Capillary: 104 mg/dL — ABNORMAL HIGH (ref 70–99)

## 2020-07-10 NOTE — Progress Notes (Signed)
[place] PHYSICAL MEDICINE & REHABILITATION PROGRESS NOTE   Subjective/Complaints: No complaints Appreciating his day off  ROS: denies CP, SOB, N/V/D  Objective:   No results found. No results for input(s): WBC, HGB, HCT, PLT in the last 72 hours. No results for input(s): NA, K, CL, CO2, GLUCOSE, BUN, CREATININE, CALCIUM in the last 72 hours.  Intake/Output Summary (Last 24 hours) at 07/10/2020 1433 Last data filed at 07/10/2020 1310 Gross per 24 hour  Intake 747 ml  Output 1250 ml  Net -503 ml        Physical Exam: Vital Signs Blood pressure 114/75, pulse 75, temperature 98.3 F (36.8 C), resp. rate 18, height 6' (1.829 m), weight 120.1 kg, SpO2 99 %.  General: Alert and oriented x 3, No apparent distress HEENT: Head is normocephalic, atraumatic, PERRLA, EOMI, sclera anicteric, oral mucosa pink and moist, dentition intact, ext ear canals clear,  Neck: Supple without JVD or lymphadenopathy Heart: Reg rate and rhythm. No murmurs rubs or gallops Chest: CTA bilaterally without wheezes, rales, or rhonchi; no distress Abdomen: Soft, non-tender, non-distended, bowel sounds positive. Extremities: No clubbing, cyanosis, or edema. Pulses are 2+ Skin: Right lower extremity with dressing CDI Psych: Normal mood.  Normal behavior. Musc: Left foot with equinovalgus deformity Right lower extremity with edema and tenderness Neuro: Alert Motor: Bilateral upper extremities: 5/5 proximal distal Right lower extremity: Hip flexion, knee extension 3-/5, ankle limited due to splint, wiggles toes, stable Left lower extremity: 4+/5 proximal distal, ankle limited due to deformity     Assessment/Plan: 1. Functional deficits secondary to right bimalleolar ankle fracture status post ORIF complicated by pulmonary embolism which require 3+ hours per day of interdisciplinary therapy in a comprehensive inpatient rehab setting.  Physiatrist is providing close team supervision and 24 hour  management of active medical problems listed below.  Physiatrist and rehab team continue to assess barriers to discharge/monitor patient progress toward functional and medical goals  Care Tool:  Bathing  Bathing activity did not occur: Refused Body parts bathed by patient: Right arm, Left arm, Chest, Abdomen, Front perineal area, Right upper leg, Left upper leg, Face, Buttocks   Body parts bathed by helper: Buttocks, Right lower leg, Left lower leg Body parts n/a: Right lower leg, Left lower leg   Bathing assist Assist Level: Contact Guard/Touching assist     Upper Body Dressing/Undressing Upper body dressing   What is the patient wearing?: Pull over shirt    Upper body assist Assist Level: Set up assist    Lower Body Dressing/Undressing Lower body dressing      What is the patient wearing?: Underwear/pull up, Pants     Lower body assist Assist for lower body dressing: Minimal Assistance - Patient > 75%     Toileting Toileting    Toileting assist Assist for toileting: Independent with assistive device Assistive Device Comment: urinal   Transfers Chair/bed transfer  Transfers assist     Chair/bed transfer assist level: Minimal Assistance - Patient > 75% Chair/bed transfer assistive device: Programmer, multimedia   Ambulation assist   Ambulation activity did not occur: Safety/medical concerns (R LE NWB status, generalized weakness, decreased balance, poor UE strength)  Assist level: Minimal Assistance - Patient > 75% Assistive device: Walker-rolling Max distance: 46ft   Walk 10 feet activity   Assist  Walk 10 feet activity did not occur: Safety/medical concerns (R LE NWB status, generalized weakness, decreased balance, poor UE strength)  Assist level: Minimal Assistance - Patient > 75%  Assistive device: Walker-rolling   Walk 50 feet activity   Assist Walk 50 feet with 2 turns activity did not occur: Safety/medical concerns (R LE NWB status,  generalized weakness, decreased balance, poor UE strength)         Walk 150 feet activity   Assist Walk 150 feet activity did not occur: Safety/medical concerns (R LE NWB status, generalized weakness, decreased balance, poor UE strength)         Walk 10 feet on uneven surface  activity   Assist Walk 10 feet on uneven surfaces activity did not occur: Safety/medical concerns (R LE NWB status, generalized weakness, decreased balance, poor UE strength)         Wheelchair     Assist Will patient use wheelchair at discharge?: Yes Type of Wheelchair: Manual    Wheelchair assist level: Supervision/Verbal cueing Max wheelchair distance: 139ft    Wheelchair 50 feet with 2 turns activity    Assist        Assist Level: Supervision/Verbal cueing   Wheelchair 150 feet activity     Assist      Assist Level: Supervision/Verbal cueing   Blood pressure 114/75, pulse 75, temperature 98.3 F (36.8 C), resp. rate 18, height 6' (1.829 m), weight 120.1 kg, SpO2 99 %.  Medical Problem List and Plan: 1.  Decreased functional mobility secondary to right bimalleolar ankle fracture status post ORIF 5/57/3220 complicated by pulmonary emboli.  Nonweightbearing x6 to 8 weeks  Continue CIR 2.  Antithrombotics: -DVT/anticoagulation: Eliquis             -antiplatelet therapy: N/A 3. Pain Management/Chronic Back pain: Cymbalta 60 mg daily, oxycodone as needed.  Patient had been on oxycodone 5 to 10 mg every 6 hours as needed prior to admission as well as Flexeril and OxyContin 10 mg 3 times daily.   Oxycodone frequency increased to every 5 mg every 4 as needed  Controlled 10/3 4. Mood: Provide emotional support             -antipsychotic agents: N/A 5. Neuropsych: This patient is capable of making decisions on his own behalf. 6. Skin/Wound Care: Routine skin checks 7. Fluids/Electrolytes/Nutrition: Routine in and outs 8.  Acute blood loss anemia.    Hemoglobin 10.4 on  9/28  Continue to monitor 9.  Diabetes mellitus peripheral neuropathy.  Hemoglobin A1c 6.7.  Currently on Lantus insulin 10 units nightly.  Patient on Glucophage 250 mg daily prior to admission.    Metformin 250 daily started on 10/1  10/2: slightly elevated: discussed eating less bread.   10/3: High of 185 CBG yesterday evening.   Monitor with increased mobility  10.  Hyperlipidemia.  Zocor 11.  AKI.  Resolved.  Follow-up chemistries 12.  Incidental findings left adrenal mass.  Follow-up outpatient  13.  Hyponatremia  Sodium 134 on 9/28, labs ordered for Monday  Continue to monitor 14. Drug induced constipation  Bowel meds increased on 9/29, increased again on 9/30  Improving   LOS: 6 days A FACE TO FACE EVALUATION WAS PERFORMED  Clide Deutscher Doreena Maulden 07/10/2020, 2:33 PM

## 2020-07-11 ENCOUNTER — Inpatient Hospital Stay (HOSPITAL_COMMUNITY): Payer: 59 | Admitting: Occupational Therapy

## 2020-07-11 ENCOUNTER — Inpatient Hospital Stay (HOSPITAL_COMMUNITY): Payer: 59

## 2020-07-11 DIAGNOSIS — E876 Hypokalemia: Secondary | ICD-10-CM

## 2020-07-11 LAB — GLUCOSE, CAPILLARY
Glucose-Capillary: 121 mg/dL — ABNORMAL HIGH (ref 70–99)
Glucose-Capillary: 118 mg/dL — ABNORMAL HIGH (ref 70–99)
Glucose-Capillary: 103 mg/dL — ABNORMAL HIGH (ref 70–99)
Glucose-Capillary: 111 mg/dL — ABNORMAL HIGH (ref 70–99)

## 2020-07-11 LAB — BASIC METABOLIC PANEL
Anion gap: 10 (ref 5–15)
BUN: 12 mg/dL (ref 8–23)
CO2: 27 mmol/L (ref 22–32)
Calcium: 9.7 mg/dL (ref 8.9–10.3)
Chloride: 100 mmol/L (ref 98–111)
Creatinine, Ser: 1.1 mg/dL (ref 0.61–1.24)
GFR calc Af Amer: >60 mL/min (ref >60)
GFR calc non Af Amer: >60 mL/min (ref >60)
Glucose, Bld: 127 mg/dL — ABNORMAL HIGH (ref 70–99)
Potassium: 3.2 mmol/L — ABNORMAL LOW (ref 3.5–5.1)
Sodium: 137 mmol/L (ref 135–145)

## 2020-07-11 MED ORDER — POTASSIUM CHLORIDE CRYS ER 20 MEQ PO TBCR
40.0000 meq | EXTENDED_RELEASE_TABLET | Freq: Once | ORAL | Status: AC
  Administered 2020-07-11: 40 meq via ORAL
  Filled 2020-07-11: qty 2

## 2020-07-11 MED ORDER — OXYCODONE HCL 5 MG PO TABS
5.0000 mg | ORAL_TABLET | ORAL | Status: DC | PRN
  Filled 2020-07-11: qty 1

## 2020-07-11 MED ORDER — OXYCODONE HCL 5 MG PO TABS
10.0000 mg | ORAL_TABLET | ORAL | Status: DC | PRN
  Administered 2020-07-11 – 2020-07-20 (×38): 10 mg via ORAL
  Filled 2020-07-11 (×38): qty 2

## 2020-07-11 NOTE — Progress Notes (Signed)
Physical Therapy Session Note  Patient Details  Name: Keith [name] MRN: [id number] Date of Birth: 24-Jan-1951  Today's Date: 07/11/2020 PT Individual Time: 2637-8588 PT Individual Time Calculation (min): 72 min   Short Term Goals: Week 1:  PT Short Term Goal 1 (Week 1): pt will transfer sit<>stand with LRAD CGA PT Short Term Goal 2 (Week 1): Pt will transfer stand<>pivot with LRAD CGA PT Short Term Goal 3 (Week 1): Pt will initiate gait training  Skilled Therapeutic Interventions/Progress Updates:   Received pt sitting in recliner, pt agreeable to therapy, and reported slight throbbing pain in R ankle but did not state pain level (premedicated). Repositioning and rest breaks given to reduce pain levels. Session with emphasis on functional mobility/transfers, generalized strengthening, dynamic standing balance/coordination, simulated car transfers, ambulation, and improved activity tolerance. L ankle brace and R non-weight bearing alarm shoe donned during session for auditory feedback. Pt transferred recliner<>WC stand<>pivot with RW and min A from cushion placed in WC and performed WC mobility >11ft using bilateral UEs and supervision to ortho gym. Pt performed simulated car transfer with RW and mod A to stand from low car seat surface with cues for R LE NWB. Therapist replaced 18x18 WC for 22x18 WC with new elevating legrest for improved comfort and fit. Pt performed WC mobility along 55ft ramp x 2 trials with min A and cues for propulsion technique and momentum required to navigate initial threshold. Pt transported to dayroom in [facility] total A and therapist took measurements to fit pt for 20x18 manual WC with R elevating legrest. Pt ambulated 76ft with RW and min A with weight bearing alarm shoe on with cues for R LE NWB but with occasional TDWB due to fatigue. Pt performed R LE toe taps to 3in step x 6 reps but unable to maintain R LE NWB precautions and with increased difficulty lifting LE.  Instead switched to 1in step and pt performed 2x10 with improved adherence to NWB precautions. Pt performed the following exercises sitting on mat with supervision and verbal cues for technique: -hip flexion 2x12 bilaterally with 1lb ankle weight on L LE -LAQ 2x12 bilaterally with 1lb ankle weight on L LE Pt transferred mat<>WC stand<>pivot with RW and min A and transported back to room in [facility] total A. Pt reported urge to use restroom and transferred WC<>toilet with bedside commode over top with RW and min A. Pt required min A for clothing management. Concluded session with pt sitting on commode and agreed to call for assist when finished.   Therapy Documentation Precautions:  Precautions Precautions: Fall, Other (comment) Precaution Comments: R ankle fx with ORIF Required Braces or Orthoses: Splint/Cast Splint/Cast: R cast Restrictions Weight Bearing Restrictions: Yes RLE Weight Bearing: Non weight bearing Other Position/Activity Restrictions: R ankle soft cast   Therapy/Group: Individual Therapy Alfonse Alpers PT, DPT   07/11/2020, 7:28 AM

## 2020-07-11 NOTE — Progress Notes (Signed)
Occupational Therapy Session Note  Patient Details  Name: Keith [name] MRN: [id number] Date of Birth: 12-01-1950  Today's Date: 07/11/2020 OT Individual Time: 0932-6712 and 4580-9983 OT Individual Time Calculation (min): 73 min and 54 min   Short Term Goals: Week 1:  OT Short Term Goal 1 (Week 1): Pt will complete bathing with min assist OT Short Term Goal 2 (Week 1): Pt will complete LB dressing with min assist at sit > stand level OT Short Term Goal 3 (Week 1): Pt will complete toilet transfers with min assist stand pivot OT Short Term Goal 4 (Week 1): Pt will complete toileting with min assist at sit > stand level  Skilled Therapeutic Interventions/Progress Updates:    1) Treatment session with focus on self-care retraining, functional transfers, dynamic standing balance, and BUE strengthening/endurance.  Pt received upright in bed reporting pain 5/10 in Rt ankle.  Notified RN, who provided pain meds during session.  Pt donned Lt ankle brace and shoe from EOB with supervision.  Pt completed sit > stand from elevated EOB with min assist and RW.  Completed stand pivot transfer bed > w/c with RW with CGA.  Pt engaged in bathing at sit > stand level at sink, utilizing mostly lateral leans for washing buttocks.  Pt able to thread BLE through underwear and pants this session with increased time.  Completed sit > stand to RW with CGA and pt able to pull underwear and pants over hips with CGA this session.  Therapist providing min cues for NWB through RLE during sit > stand and dynamic standing.  Pt propelled w/c to therapy gym for BUE strengthening/endurance.  Engaged in [place] with 5# dumbbells with focus on bicep, tricep, and shoulder strengthening 2 sets of 10 each exercise.  Therapist provided pt with HEP with theraband to continue to address BUE strengthen and endurance as needed for mobility.  Engaged in demonstration and practice of each exercise with pt able to return  demonstration with min cues for technique.  Encouraged pt to complete exercises 2x/day outside of therapy sessions.  Pt returned to room and completed stand pivot transfer w/c > recliner with RW and CGA.  Pt remained upright in recliner with chair alarm on and all needs in reach.  2) Treatment session with focus on BUE strengthening, problem solving LB dressing, and d/c planning.  Pt received upright in recliner reporting c/o pain, not rated, and complaints that he had not received pain medicine despite requesting over an hour ago.  Therapist notified charge nurse of pt complaint.  Pt agreeable to therapy in room to be present to await pain medicine.  Engaged in chest presses, overhead presses, tricep presses, and trunk rotation with 2 kg medicine ball, 3 sets of 10 each.  RN arrived during session, provided pain meds.  Engaged in discussion regarding home setup, recommended DME, and LB dressing.  Pt reports plan to d/c home and then 1 week later go on cruise.  Discussed donning dress pants for cruise dinners, use of reacher for LB dressing as needed.  Pt asking about compression stockings for cruise.  Discussed wearing them on the airplane and awaiting confirmation about full time use while on ship as well.  Pt remained seated in recliner with all needs in reach and chair alarm on.  Therapy Documentation Precautions:  Precautions Precautions: Fall, Other (comment) Precaution Comments: R ankle fx with ORIF Required Braces or Orthoses: Splint/Cast Splint/Cast: R cast Restrictions Weight Bearing Restrictions: Yes RLE Weight Bearing:  Non weight bearing Other Position/Activity Restrictions: R ankle soft cast General:   Vital Signs: Therapy Vitals Temp: 98.6 F (37 C) Pulse Rate: 76 BP: 118/74 Patient Position (if appropriate): Lying Oxygen Therapy SpO2: 97 % O2 Device: Room Air Pain: Pain Assessment Pain Scale: 0-10 Pain Score: 5  Pain Type: Acute pain;Surgical pain Pain Location:  Ankle Pain Orientation: Right Pain Descriptors / Indicators: Aching;Sharp;Shooting;Tender Pain Frequency: Intermittent Pain Intervention(s): RN made aware ADL: ADL Eating: Independent Where Assessed-Eating: Chair Upper Body Dressing: Setup Where Assessed-Upper Body Dressing: Edge of bed Lower Body Dressing: Moderate assistance Where Assessed-Lower Body Dressing: Edge of bed Toileting: Maximal assistance Where Assessed-Toileting: Bedside Commode (over toilet) Toilet Transfer: Moderate assistance Toilet Transfer Method: Stand pivot Toilet Transfer Equipment: Bedside commode (RW) Vision   Perception    Praxis   Exercises:   Other Treatments:     Therapy/Group: Individual Therapy  Simonne Come 07/11/2020, 7:46 AM

## 2020-07-11 NOTE — Progress Notes (Signed)
[place] PHYSICAL MEDICINE & REHABILITATION PROGRESS NOTE   Subjective/Complaints: Patient seen sitting up, working with therapies this AM.  He states he slept well overnight.  He states he had 1 episode of throbbing in his right ankle yesterday, but does not want to change his pain medications.   ROS: Denies CP, SOB, N/V/D  Objective:   No results found. No results for input(s): WBC, HGB, HCT, PLT in the last 72 hours. Recent Labs    07/11/20 0619  NA 137  K 3.2*  CL 100  CO2 27  GLUCOSE 127*  BUN 12  CREATININE 1.10  CALCIUM 9.7    Intake/Output Summary (Last 24 hours) at 07/11/2020 1437 Last data filed at 07/11/2020 1300 Gross per 24 hour  Intake 840 ml  Output 725 ml  Net 115 ml        Physical Exam: Vital Signs Blood pressure 120/82, pulse 80, temperature 97.9 F (36.6 C), resp. rate 18, height 6' (1.829 m), weight 120.1 kg, SpO2 100 %. Constitutional: No distress . Vital signs reviewed. HENT: Normocephalic.  Atraumatic. Eyes: EOMI. No discharge. Cardiovascular: No JVD.  RRR. Respiratory: Normal effort.  No stridor.  Bilateral clear to auscultation. GI: Non-distended.  BS +. Skin: RLE with dressing CDI Psych: Normal mood.  Normal behavior. Musc: Left food with equinovalgus deformity RLE with edema and tenderness Neuro: Alert Motor: Bilateral upper extremities: 5/5 proximal distal Right lower extremity: Hip flexion, knee extension 3-/5, ankle limited due to splint, wiggles toes, improving Left lower extremity: 4+/5 proximal distal, ankle limited due to deformity     Assessment/Plan: 1. Functional deficits secondary to right bimalleolar ankle fracture status post ORIF complicated by pulmonary embolism which require 3+ hours per day of interdisciplinary therapy in a comprehensive inpatient rehab setting.  Physiatrist is providing close team supervision and 24 hour management of active medical problems listed below.  Physiatrist and rehab team continue  to assess barriers to discharge/monitor patient progress toward functional and medical goals  Care Tool:  Bathing  Bathing activity did not occur: Refused Body parts bathed by patient: Right arm, Left arm, Chest, Abdomen, Front perineal area, Right upper leg, Left upper leg, Face, Buttocks   Body parts bathed by helper: Buttocks, Right lower leg, Left lower leg Body parts n/a: Right lower leg, Left lower leg   Bathing assist Assist Level: Contact Guard/Touching assist     Upper Body Dressing/Undressing Upper body dressing   What is the patient wearing?: Pull over shirt    Upper body assist Assist Level: Set up assist    Lower Body Dressing/Undressing Lower body dressing      What is the patient wearing?: Underwear/pull up, Pants     Lower body assist Assist for lower body dressing: Contact Guard/Touching assist     Toileting Toileting    Toileting assist Assist for toileting: Independent with assistive device Assistive Device Comment: urinal   Transfers Chair/bed transfer  Transfers assist     Chair/bed transfer assist level: Minimal Assistance - Patient > 75% Chair/bed transfer assistive device: Programmer, multimedia   Ambulation assist   Ambulation activity did not occur: Safety/medical concerns (R LE NWB status, generalized weakness, decreased balance, poor UE strength)  Assist level: Minimal Assistance - Patient > 75% Assistive device: Walker-rolling Max distance: 65ft   Walk 10 feet activity   Assist  Walk 10 feet activity did not occur: Safety/medical concerns (R LE NWB status, generalized weakness, decreased balance, poor UE strength)  Assist level:  Minimal Assistance - Patient > 75% Assistive device: Walker-rolling   Walk 50 feet activity   Assist Walk 50 feet with 2 turns activity did not occur: Safety/medical concerns (R LE NWB status, generalized weakness, decreased balance, poor UE strength)         Walk 150 feet  activity   Assist Walk 150 feet activity did not occur: Safety/medical concerns (R LE NWB status, generalized weakness, decreased balance, poor UE strength)         Walk 10 feet on uneven surface  activity   Assist Walk 10 feet on uneven surfaces activity did not occur: Safety/medical concerns (R LE NWB status, generalized weakness, decreased balance, poor UE strength)         Wheelchair     Assist Will patient use wheelchair at discharge?: Yes Type of Wheelchair: Manual    Wheelchair assist level: Supervision/Verbal cueing Max wheelchair distance: 162ft    Wheelchair 50 feet with 2 turns activity    Assist        Assist Level: Supervision/Verbal cueing   Wheelchair 150 feet activity     Assist      Assist Level: Supervision/Verbal cueing   Blood pressure 120/82, pulse 80, temperature 97.9 F (36.6 C), resp. rate 18, height 6' (1.829 m), weight 120.1 kg, SpO2 100 %.  Medical Problem List and Plan: 1.  Decreased functional mobility secondary to right bimalleolar ankle fracture status post ORIF 9/47/6546 complicated by pulmonary emboli.  Nonweightbearing x6 to 8 weeks  Continue CIR 2.  Antithrombotics: -DVT/anticoagulation: Eliquis             -antiplatelet therapy: N/A 3. Pain Management/Chronic Back pain: Cymbalta 60 mg daily, oxycodone as needed.  Patient had been on oxycodone 5 to 10 mg every 6 hours as needed prior to admission as well as Flexeril and OxyContin 10 mg 3 times daily.   Oxycodone frequency increased to every 5 mg every 4 as needed  Controlled with meds on 10/4 4. Mood: Provide emotional support             -antipsychotic agents: N/A 5. Neuropsych: This patient is capable of making decisions on his own behalf. 6. Skin/Wound Care: Routine skin checks 7. Fluids/Electrolytes/Nutrition: Routine in and outs 8.  Acute blood loss anemia.    Hemoglobin 10.4 on 9/28  Continue to monitor 9.  Diabetes mellitus peripheral neuropathy.   Hemoglobin A1c 6.7.  Currently on Lantus insulin 10 units nightly.  Patient on Glucophage 250 mg daily prior to admission.    Metformin 250 daily started on 10/1  Slightly labile on 10/4, but ?stabalizing  Monitor with increased mobility  10.  Hyperlipidemia.  Zocor 11.  AKI.  Resolved.  Follow-up chemistries 12.  Incidental findings left adrenal mass.  Follow-up outpatient  13.  Hyponatremia  Sodium 137 on 10/4  Continue to monitor 14. Drug induced constipation  Bowel meds increased on 9/29, increased again on 9/30  Improving with meds 15. Hypokalemia  K+ 3.2 on 10/4  Discussed with pharmacy, supplemented x1 day   LOS: 7 days A FACE TO FACE EVALUATION WAS PERFORMED  Keith [name] Keith [name] 07/11/2020, 2:37 PM

## 2020-07-11 NOTE — Progress Notes (Signed)
Patient has verbalized thus far sharp, throbbing to aching pain at times discomfort also is a sudden shooting onset to right ankle,, Denies any sensation/discomfort to right leg. Administered prn as ordered ,monitor, placed call bell within reach, bed alarm on,

## 2020-07-11 NOTE — Progress Notes (Signed)
K+ 3.2 this AM. Ok to replace with Kdur 13meq x1 per Dr. Posey Pronto.  Onnie Boer, PharmD, BCIDP, AAHIVP, CPP Infectious Disease Pharmacist 07/11/2020 10:01 AM

## 2020-07-11 NOTE — Plan of Care (Signed)
  Problem: Consults Goal: RH GENERAL PATIENT EDUCATION Description: Patient education on plan of care Outcome: Progressing Goal: Skin Care Protocol Initiated - if Braden Score 18 or less Description: If consults are not indicated, leave blank or document N/A Outcome: Progressing Goal: Nutrition Consult-if indicated Outcome: Progressing Goal: Diabetes Guidelines if Diabetic/Glucose > 140 Description: If diabetic or lab glucose is > 140 mg/dl - Initiate Diabetes/Hyperglycemia Guidelines & Document Interventions  Outcome: Progressing   Problem: RH BOWEL ELIMINATION Goal: RH STG MANAGE BOWEL WITH ASSISTANCE Description: Manage with min assist Outcome: Progressing   Problem: RH BLADDER ELIMINATION Goal: RH STG MANAGE BLADDER WITH ASSISTANCE Description: Manage w/ min assist Outcome: Progressing   Problem: RH SKIN INTEGRITY Goal: RH STG SKIN FREE OF INFECTION/BREAKDOWN Description: Manage w/ min assist Outcome: Progressing   Problem: RH SAFETY Goal: RH STG ADHERE TO SAFETY PRECAUTIONS W/ASSISTANCE/DEVICE Description: Adhere to floor safety plan Outcome: Progressing Goal: RH STG DECREASED RISK OF FALL WITH ASSISTANCE Description: No falls during admission, use proper transfer method Outcome: Progressing   Problem: RH KNOWLEDGE DEFICIT GENERAL Goal: RH STG INCREASE KNOWLEDGE OF SELF CARE AFTER HOSPITALIZATION Description: Pt and family education Outcome: Progressing

## 2020-07-12 ENCOUNTER — Inpatient Hospital Stay (HOSPITAL_COMMUNITY): Payer: 59 | Admitting: Occupational Therapy

## 2020-07-12 ENCOUNTER — Inpatient Hospital Stay (HOSPITAL_COMMUNITY): Payer: 59

## 2020-07-12 ENCOUNTER — Inpatient Hospital Stay (HOSPITAL_COMMUNITY): Payer: Medicare Other

## 2020-07-12 LAB — GLUCOSE, CAPILLARY
Glucose-Capillary: 149 mg/dL — ABNORMAL HIGH (ref 70–99)
Glucose-Capillary: 113 mg/dL — ABNORMAL HIGH (ref 70–99)
Glucose-Capillary: 141 mg/dL — ABNORMAL HIGH (ref 70–99)
Glucose-Capillary: 131 mg/dL — ABNORMAL HIGH (ref 70–99)

## 2020-07-12 NOTE — Plan of Care (Signed)
  Problem: RH Stairs Goal: LTG Patient will ambulate up and down stairs w/assist (PT) Description: LTG: Patient will ambulate up and down # of stairs with assistance (PT) Outcome: Not Applicable Flowsheets (Taken 07/12/2020 0741) LTG: Pt will ambulate up/down stairs assist needed:: (D/C) -- Note: D/C

## 2020-07-12 NOTE — Progress Notes (Signed)
     Subjective:    Patient reports pain to be well controlled this afternoon. Denies parasthesias in RLE.   Objective:  PE: VITALS:   Vitals:   07/11/20 0519 07/11/20 1412 07/11/20 1920 07/12/20 0513  BP: 118/74 120/82 129/75 125/77  Pulse: 76 80 81 93  Resp:  18  18  Temp: 98.6 F (37 C) 97.9 F (36.6 C) 98.4 F (36.9 C) 98.6 F (37 C)  TempSrc:      SpO2: 97% 100% 100% 96%  Weight:      Height:       General: Well appearing male, in no acute distress MSK: splint removed. Continue edema at right ankle, though improved since admission. Able to move all toes of right foot. Sensation intact to all aspects of right foot. + DP pulse. Surgical incision appears well opposed, no drainage. Nylon sutures intact. No blistering of skin.    LABS  Results for orders placed or performed during the hospital encounter of 07/04/20 (from the past 24 hour(s))  Glucose, capillary     Status: Abnormal   Collection Time: 07/11/20  4:46 PM  Result Value Ref Range   Glucose-Capillary 103 (H) 70 - 99 mg/dL  Glucose, capillary     Status: Abnormal   Collection Time: 07/11/20  9:39 PM  Result Value Ref Range   Glucose-Capillary 111 (H) 70 - 99 mg/dL  Glucose, capillary     Status: Abnormal   Collection Time: 07/12/20  6:09 AM  Result Value Ref Range   Glucose-Capillary 149 (H) 70 - 99 mg/dL  Glucose, capillary     Status: Abnormal   Collection Time: 07/12/20 11:43 AM  Result Value Ref Range   Glucose-Capillary 113 (H) 70 - 99 mg/dL    No results found.  Assessment/Plan: Right trimalleolar ankle fracture: - continue non-weight bearing until follow-up in our office - splint removed today, wound check performed - skin integrity intact - X-rays taken showing no change since ORIF - short leg cast placed, plan to keep on until he follows up in our office after cruise   Contact information:   Weekdays 8-5 Merlene Pulling, PA-C [phone] A fter hours and holidays please check [email or web address]  for group call information for [facility]  Ventura Bruns 07/12/2020, 2:42 PM

## 2020-07-12 NOTE — Progress Notes (Signed)
Physical Therapy Session Note  Patient Details  Name: Keith [name] MRN: [id number] Date of Birth: 28-Aug-1951  Today's Date: 07/12/2020 PT Individual Time: 1000-1056 PT Individual Time Calculation (min): 56 min   Short Term Goals: Week 1:  PT Short Term Goal 1 (Week 1): pt will transfer sit<>stand with LRAD CGA PT Short Term Goal 2 (Week 1): Pt will transfer stand<>pivot with LRAD CGA PT Short Term Goal 3 (Week 1): Pt will initiate gait training  Skilled Therapeutic Interventions/Progress Updates:   Received pt sitting in recliner, pt agreeable to therapy, and denied any pain during session. Session with emphasis on functional mobility/transfers, generalized strengthening, dynamic standing balance/coordination, ambulation, and improved activity tolerance. R LE non-weight bearing alarm shoe alarm donned throughout session for auditory feedback. Pt transferred recliner<>WC stand<>pivot with RW and min A with cues to maintain R LE NWB precautions. Pt performed WC mobility 182ft using bilateral UEs and supervision to therapy gym. Pt transferred sit<>stand with RW and CGA and ambulated 75ft x 2 trials with RW and CGA with occasional TDWB instead of NWB with fatigue. Pt transferred stand<>pivot WC<>mat with RW and CGA. Pt transferred sit<>stand with RW x 4 trials with CGA from elevated mat and performed the following exercises standing with bilateral UE support on RW and CGA for balance: -R hip flexion 2x8 -R hip extension x8 (pt with increased difficulty clearing LE and with decreased hip extension ROM) -R knee flexion x8 (pt with decreased R knee flexion ROM and increased difficulty. Returned to sitting and performed 2x10 active assisted R knee flexion with towel underneath LE to decrease friction).  -tricep extensions on yoga blocks 2x10 Worked on dynamic sitting balance and core strengthening performing seated volleyball toss 3x15 with 4lb dowel and supervision. Noted pt with increased R hip  flexor tightness and educated pt on exercises to perform in bed to decrease tightness; pt verbalized understanding. Pt transferred stand<>pivot mat<>WC with RW CGA and transported back to room in [facility] total A. Stand<>pivot WC<>recliner with cushion underneath to increase height. Concluded session with pt sitting in recliner, needs within reach, and chair pad alarm on.   Therapy Documentation Precautions:  Precautions Precautions: Fall, Other (comment) Precaution Comments: R ankle fx with ORIF Required Braces or Orthoses: Splint/Cast Splint/Cast: R cast Restrictions Weight Bearing Restrictions: Yes RLE Weight Bearing: Non weight bearing Other Position/Activity Restrictions: R ankle soft cast   Therapy/Group: Individual Therapy Alfonse Alpers PT, DPT   07/12/2020, 7:32 AM

## 2020-07-12 NOTE — Progress Notes (Signed)
Orthopedic Tech Progress Note Patient Details:  Keith [name] June 16, 1951 [id number] Applied cast with help from other [facility] tech and coached by PA. Casting Type of Cast: Short leg cast Cast Location: RLE Cast Material: Fiberglass Cast Intervention: Application  Post Interventions Patient Tolerated: Well Instructions Provided: Care of device     Janit Pagan 07/12/2020, 3:12 PM

## 2020-07-12 NOTE — Progress Notes (Signed)
Occupational Therapy Weekly Progress Note  Patient Details  Name: Keith [name] MRN: [id number] Date of Birth: 1951-02-04  Beginning of progress report period: July 05, 2020 End of progress report period: July 12, 2020  Today's Date: 07/12/2020 OT Individual Time: 5170-0174 and  9449-6759 OT Individual Time Calculation (min): 73 min and 75 min    Patient has met 4 of 4 short term goals.  Pt is making steady progress towards goals.  Pt currently completes sit > stand and stand pivot transfers CGA with RW. Pt continues to require min cues for WB status as pt with tendency to complete TDWB instead of NWB on RLE.  Pt is able to complete bathing and LB dressing with CGA at sit > stand level, demonstrating improved dynamic standing balance.    Patient continues to demonstrate the following deficits: muscle weakness, decreased cardiorespiratoy endurance and decreased standing balance, decreased balance strategies, difficulty maintaining precautions and pain and therefore will continue to benefit from skilled OT intervention to enhance overall performance with BADL and Reduce care partner burden.  Patient progressing toward long term goals..  Continue plan of care.  OT Short Term Goals Week 1:  OT Short Term Goal 1 (Week 1): Pt will complete bathing with min assist OT Short Term Goal 1 - Progress (Week 1): Met OT Short Term Goal 2 (Week 1): Pt will complete LB dressing with min assist at sit > stand level OT Short Term Goal 2 - Progress (Week 1): Met OT Short Term Goal 3 (Week 1): Pt will complete toilet transfers with min assist stand pivot OT Short Term Goal 3 - Progress (Week 1): Met OT Short Term Goal 4 (Week 1): Pt will complete toileting with min assist at sit > stand level OT Short Term Goal 4 - Progress (Week 1): Met Week 2:  OT Short Term Goal 1 (Week 2): STG = LTGs due to remaining LOS  Skilled Therapeutic Interventions/Progress Updates:    1) Treatment session with focus  on self-care retraining, functional transfers, LB dressing, and BUE strengthening and endurance.  Pt received upright in bed agreeable to bathing and dressing.  Pt donned Lt ankle brace and shoe prior to exiting bed.  Pt completed sit > stand and stand pivot transfer with RW with CGA to w/c.  Engaged in bathing at sit > stand level at sink with CGA when standing to wash buttocks and then when pulling underwear and pants over hips.  Pt able to thread BLE through underwear and pants with increased time but no physical assistance.  Discussed wearing of TEDS for upcoming trip.  MD present, discussing wear for airplane and intermittent use/as needed for swelling during cruise.  Pt able to don compression stocking to LLE and then donned ankle brace and shoe again without assistance.  Engaged in [place] and endurance propelling w/c >150' at [place] in [place] and endurance on UBE resistance 4.8 with 8 mins forward and 8 mins backward with improved endurance and stamina.  Pt propelled back to room and transferred w/c > recliner with CGA.  Pt remained upright in recliner with all needs in reach and chair alarm on.  2) Treatment session with focus on functional mobility in home environment.  Pt received upright in recliner agreeable to therapy session.  Pt completed sit > stand and stand pivot transfer CGA to w/c.  Pt propelled w/c to ortho gym without assistance.  Pt propelled up/down ramp x3 with Min A at initial  incline, largely due to angle of ramp.  Engaged in functional mobility in ADL apt with problem solving accessing items from w/c level.  Pt able to open and retreive items from refrigerator.  Discussed modifying setup for most used items and having wife setup items to increase independence when possible.  Pt returned to room propelling w/c for endurance.  Transferred stand pivot w/c > recliner with CGA.  [name] arrived at end of session.  Discussed need to arrange family education  prior to d/c.  Therapy Documentation Precautions:  Precautions Precautions: Fall, Other (comment) Precaution Comments: R ankle fx with ORIF Required Braces or Orthoses: Splint/Cast Splint/Cast: R cast Restrictions Weight Bearing Restrictions: Yes RLE Weight Bearing: Non weight bearing Other Position/Activity Restrictions: R ankle soft cast General:   Vital Signs: Therapy Vitals Temp: 98.6 F (37 C) Pulse Rate: 93 Resp: 18 BP: 125/77 Patient Position (if appropriate): Lying Oxygen Therapy SpO2: 96 % O2 Device: Room Air Pain: Pain Assessment Pain Scale: 0-10 Pain Score: 0-No pain Pain Type: Surgical pain Pain Location: Ankle Pain Orientation: Right Pain Intervention(s): Medication (See eMAR) ADL: ADL Eating: Independent Where Assessed-Eating: Chair Upper Body Dressing: Setup Where Assessed-Upper Body Dressing: Edge of bed Lower Body Dressing: Moderate assistance Where Assessed-Lower Body Dressing: Edge of bed Toileting: Maximal assistance Where Assessed-Toileting: Bedside Commode (over toilet) Toilet Transfer: Moderate assistance Toilet Transfer Method: Stand pivot Toilet Transfer Equipment: Bedside commode (RW) Vision   Perception    Praxis   Exercises:   Other Treatments:     Therapy/Group: Individual Therapy  Simonne Come 07/12/2020, 7:54 AM

## 2020-07-12 NOTE — Progress Notes (Signed)
[facility] PHYSICAL MEDICINE & REHABILITATION PROGRESS NOTE   Subjective/Complaints: Patient seen sitting up at the edge of his bed, working with therapies.  He states he slept very well overnight.  Discussed teds hose and plans for patient cruise with therapies and patient.  ROS: Denies CP, SOB, N/V/D  Objective:   No results found. No results for input(s): WBC, HGB, HCT, PLT in the last 72 hours. Recent Labs    07/11/20 0619  NA 137  K 3.2*  CL 100  CO2 27  GLUCOSE 127*  BUN 12  CREATININE 1.10  CALCIUM 9.7    Intake/Output Summary (Last 24 hours) at 07/12/2020 1218 Last data filed at 07/12/2020 0721 Gross per 24 hour  Intake 840 ml  Output 1000 ml  Net -160 ml        Physical Exam: Vital Signs Blood pressure 125/77, pulse 93, temperature 98.6 F (37 C), resp. rate 18, height 6' (1.829 m), weight 120.1 kg, SpO2 96 %. Constitutional: No distress . Vital signs reviewed. HENT: Normocephalic.  Atraumatic. Eyes: EOMI. No discharge. Cardiovascular: No JVD.  RRR. Respiratory: Normal effort.  No stridor.  Bilateral clear to auscultation. GI: Non-distended.  BS +. Skin: RLE with dressing CDI Right anterior tibia with open lesion Psych: Normal mood.  Normal behavior. Musc: Left foot with equina valgus deformity Right lower extremity with edema and tenderness, improving Neuro: Alert Motor: Bilateral upper extremities: 5/5 proximal distal Right lower extremity: Hip flexion, knee extension 4-/5, ankle limited due to splint, wiggles toes Left lower extremity: 4+/5 proximal distal, ankle limited due to deformity  Assessment/Plan: 1. Functional deficits secondary to right bimalleolar ankle fracture status post ORIF complicated by pulmonary embolism which require 3+ hours per day of interdisciplinary therapy in a comprehensive inpatient rehab setting.  Physiatrist is providing close team supervision and 24 hour management of active medical problems listed  below.  Physiatrist and rehab team continue to assess barriers to discharge/monitor patient progress toward functional and medical goals  Care Tool:  Bathing  Bathing activity did not occur: Refused Body parts bathed by patient: Right arm, Left arm, Chest, Abdomen, Front perineal area, Right upper leg, Left upper leg, Face, Buttocks   Body parts bathed by helper: Buttocks, Right lower leg, Left lower leg Body parts n/a: Right lower leg, Left lower leg   Bathing assist Assist Level: Contact Guard/Touching assist     Upper Body Dressing/Undressing Upper body dressing   What is the patient wearing?: Pull over shirt    Upper body assist Assist Level: Set up assist    Lower Body Dressing/Undressing Lower body dressing      What is the patient wearing?: Underwear/pull up, Pants     Lower body assist Assist for lower body dressing: Contact Guard/Touching assist     Toileting Toileting    Toileting assist Assist for toileting: Independent with assistive device Assistive Device Comment: urinal   Transfers Chair/bed transfer  Transfers assist     Chair/bed transfer assist level: Contact Guard/Touching assist Chair/bed transfer assistive device: Programmer, multimedia   Ambulation assist   Ambulation activity did not occur: Safety/medical concerns (R LE NWB status, generalized weakness, decreased balance, poor UE strength)  Assist level: Contact Guard/Touching assist Assistive device: Walker-rolling Max distance: 74ft   Walk 10 feet activity   Assist  Walk 10 feet activity did not occur: Safety/medical concerns (R LE NWB status, generalized weakness, decreased balance, poor UE strength)  Assist level: Contact Guard/Touching assist Assistive device: Walker-rolling  Walk 50 feet activity   Assist Walk 50 feet with 2 turns activity did not occur: Safety/medical concerns (R LE NWB status, generalized weakness, decreased balance, poor UE strength)          Walk 150 feet activity   Assist Walk 150 feet activity did not occur: Safety/medical concerns (R LE NWB status, generalized weakness, decreased balance, poor UE strength)         Walk 10 feet on uneven surface  activity   Assist Walk 10 feet on uneven surfaces activity did not occur: Safety/medical concerns (R LE NWB status, generalized weakness, decreased balance, poor UE strength)         Wheelchair     Assist Will patient use wheelchair at discharge?: Yes Type of Wheelchair: Manual    Wheelchair assist level: Supervision/Verbal cueing Max wheelchair distance: 138ft    Wheelchair 50 feet with 2 turns activity    Assist        Assist Level: Supervision/Verbal cueing   Wheelchair 150 feet activity     Assist      Assist Level: Supervision/Verbal cueing   Blood pressure 125/77, pulse 93, temperature 98.6 F (37 C), resp. rate 18, height 6' (1.829 m), weight 120.1 kg, SpO2 96 %.  Medical Problem List and Plan: 1.  Decreased functional mobility secondary to right bimalleolar ankle fracture status post ORIF 9/48/5462 complicated by pulmonary emboli.  Nonweightbearing x6 to 8 weeks  Continue CIR 2.  Antithrombotics: -DVT/anticoagulation: Eliquis             -antiplatelet therapy: N/A 3. Pain Management/Chronic Back pain: Cymbalta 60 mg daily, oxycodone as needed.  Patient had been on oxycodone 5 to 10 mg every 6 hours as needed prior to admission as well as Flexeril and OxyContin 10 mg 3 times daily.   Oxycodone frequency increased to every 5 mg every 4 as needed  Controlled with meds on 10/5 4. Mood: Provide emotional support             -antipsychotic agents: N/A 5. Neuropsych: This patient is capable of making decisions on his own behalf. 6. Skin/Wound Care: Routine skin checks 7. Fluids/Electrolytes/Nutrition: Routine in and outs 8.  Acute blood loss anemia.    Hemoglobin 10.4 on 9/28  Continue to monitor 9.  Diabetes mellitus  peripheral neuropathy.  Hemoglobin A1c 6.7.  Currently on Lantus insulin 10 units nightly.  Patient on Glucophage 250 mg daily prior to admission.    Metformin 250 daily started on 10/1  Slightly labile on 10/5, but relatively controlled  Monitor with increased mobility  10.  Hyperlipidemia.  Zocor 11.  AKI.  Resolved.  Follow-up chemistries 12.  Incidental findings left adrenal mass.  Follow-up outpatient  13.  Hyponatremia  Sodium 137 on 10/4, labs ordered for tomorrow  Continue to monitor 14. Drug induced constipation  Bowel meds increased on 9/29, increased again on 9/30  Improving 15. Hypokalemia  K+ 3.2 on 10/4, labs ordered for tomorrow  Discussed with pharmacy, supplemented x1 day on 10/4   LOS: 8 days A FACE TO FACE EVALUATION WAS PERFORMED  Bowie Doiron Lorie Phenix 07/12/2020, 12:18 PM

## 2020-07-13 ENCOUNTER — Inpatient Hospital Stay (HOSPITAL_COMMUNITY): Payer: 59

## 2020-07-13 ENCOUNTER — Inpatient Hospital Stay (HOSPITAL_COMMUNITY): Payer: 59 | Admitting: Occupational Therapy

## 2020-07-13 LAB — BASIC METABOLIC PANEL
Anion gap: 12 (ref 5–15)
BUN: 11 mg/dL (ref 8–23)
CO2: 27 mmol/L (ref 22–32)
Calcium: 10.2 mg/dL (ref 8.9–10.3)
Chloride: 99 mmol/L (ref 98–111)
Creatinine, Ser: 1.1 mg/dL (ref 0.61–1.24)
GFR calc non Af Amer: >60 mL/min (ref >60)
Glucose, Bld: 123 mg/dL — ABNORMAL HIGH (ref 70–99)
Potassium: 3.3 mmol/L — ABNORMAL LOW (ref 3.5–5.1)
Sodium: 138 mmol/L (ref 135–145)

## 2020-07-13 LAB — GLUCOSE, CAPILLARY
Glucose-Capillary: 131 mg/dL — ABNORMAL HIGH (ref 70–99)
Glucose-Capillary: 116 mg/dL — ABNORMAL HIGH (ref 70–99)
Glucose-Capillary: 139 mg/dL — ABNORMAL HIGH (ref 70–99)
Glucose-Capillary: 120 mg/dL — ABNORMAL HIGH (ref 70–99)

## 2020-07-13 NOTE — Progress Notes (Signed)
Patient ID: Keith [name], male   DOB: 1951-08-29, 69 y.o.   MRN: [id number]  Met with pt to discuss team conference progression toward his goals and discharge still 1013. Discussed having wife come in next week Monday or Tuesday for education. He will find out tonight and left worker know tomorrow.

## 2020-07-13 NOTE — Patient Care Conference (Signed)
Inpatient RehabilitationTeam Conference and Plan of Care Update Date: 07/13/2020   Time: 11:31 AM    Patient Name: Keith [name]      Medical Record Number: [id number]  Date of Birth: June 11, 1951 Sex: Male         Room/Bed: 4W22C/4W22C-01 Payor Info: Payor: MEDICARE / Plan: MEDICARE PART A AND B / Product Type: *No Product type* /    Admit Date/Time:  07/04/2020  4:39 PM  Primary Diagnosis:  [facility] Problems: Principal Problem:   Debility Active Problems:   Pulmonary emboli (HCC)   Hyponatremia   Diabetic peripheral neuropathy (HCC)   Acute blood loss anemia   Chronic pain syndrome   Postoperative pain   Drug induced constipation   Hypokalemia    Expected Discharge Date: Expected Discharge Date: 07/20/20  Team Members Present: Physician leading conference: Dr. Leeroy Cha Care Coodinator Present: Dorien Chihuahua, RN, BSN, [place] Nurse Present: Other (comment) Demetrios Loll, RN) PT Present: Becky Sax, PT OT Present: Simonne Come, OT PPS Coordinator present : Gunnar Fusi, SLP     Current Status/Progress Goal Weekly Team Focus  Bowel/Bladder   continent of b/b; LBM: 10/04  remain continent of b/b  assist with tolieting needs prn   Swallow/Nutrition/ Hydration             ADL's   CGA to close supervision bathing and dressing, CGA sit > stand and stand pivot transfers,  Supervision transfers, Mod I bathing and dressing  ADL retraining, functional transfers, BUE strengthening, endurance, dynamic standing balance, d/c planning, family education   Mobility   bed mobility supervision, transfers with RW min A, WC mobility 164ft supervision, gait 40ft with RW min A  supervision  functional mobility/transfers, generalized strengthening, pt education, dynamic standing balance/coordination, ambulation, and improved activity tolerance.   Communication             Safety/Cognition/ Behavioral Observations            Pain   c/o pain to R ankle fx; prn  oxycodone 5-10mg  Q4hr  pain level <6/10  assess pain level QS and prn   Skin   cast placed on R ankle fx  remain free of new skin breakdown/infection  assess skin QS and prn     Discharge Planning:  Home with wife who will need to come in for educaiton prior to discharge home. Doing better in therapies and making progress   Team Discussion: Hypokalemia addressed per MD with supplement. CBG levels addressed with dietary education. Reported right ankle pain limiting progress Patient on target to meet rehab goals: Yes; currenlty CGA for transfers and adheres to NWB status when standing for clothes management  *See Care Plan and progress notes for long and short-term goals.   Revisions to Treatment Plan:  Add RMTs and speech strategies Teaching Needs: Transfers, toileting, speech strategies and medications, etc.  Current Barriers to Discharge: Decreased caregiver support, Home enviroment access/layout and Weight bearing restrictions  Possible Resolutions to Barriers: Ramp installation is pending Family education TBD     Medical Summary Current Status: Some irritation right ankle from new cast, pain is well controlled with oxycodone, CBGs slightly elevated, NWB right lower extremity  Barriers to Discharge: Medication compliance;Weight bearing restrictions;Decreased family/caregiver support  Barriers to Discharge Comments: Hypokalemia, right ankle irritation/pain, CBGs slightly elevated, non-weightbearing right lower extremity, wife is main social support Possible Resolutions to [employer] Focus: K+ supplemented, continue oxycodone which is providing good pain releif, provided dietary education regarding increased CBGs, cues  to maintain WB precautions   Continued Need for Acute Rehabilitation Level of Care: The patient requires daily medical management by a physician with specialized training in physical medicine and rehabilitation for the following reasons: Direction of a  multidisciplinary physical rehabilitation program to maximize functional independence : Yes Medical management of patient stability for increased activity during participation in an intensive rehabilitation regime.: Yes Analysis of laboratory values and/or radiology reports with any subsequent need for medication adjustment and/or medical intervention. : Yes   I attest that I was present, lead the team conference, and concur with the assessment and plan of the team.   Dorien Chihuahua B 07/13/2020, 1:16 PM

## 2020-07-13 NOTE — Progress Notes (Signed)
Occupational Therapy Session Note  Patient Details  Name: Keith [name] MRN: [id number] Date of Birth: 1951/07/18  Today's Date: 07/13/2020 OT Individual Time: 4098-1191 and 4782-9562 OT Individual Time Calculation (min): 65 min and 70 min   Short Term Goals: Week 2:  OT Short Term Goal 1 (Week 2): STG = LTGs due to remaining LOS  Skilled Therapeutic Interventions/Progress Updates:    1) Treatment session with focus on self-care retraining, functional transfers, dynamic standing balance, and BUE strengthening and endurance.  Pt received semi-reclined in bed.  Discussed home bed setup, pt reports having posturepedic bed that can elevate and head can elevate.  Pt completed bed mobility from semi-reclined position with supervision.  Donned compression stocking, ankle brace, and shoe with setup assist.  Pt completed sit > stand and stand pivot transfer to w/c with RW CGA.  Engaged in bathing and dressing at sit > stand level. Close supervision when standing to pull pants over hips, therapist providing min cues for NWB through RLE while standing. Pt propelled w/c 150' Mod I, managing leg rests prior to mobility with setup.  Engaged in 3 sets of 10 chest presses, overhead presses, and elbow extension with 5# dumbbells.  Pt reports need to toilet. Returned to room and completed stand pivot transfer to [facility] over toilet with RW and CGA.  Pt completed hygiene with lateral leans and clothing management at sit > stand level with CGA.  2) Treatment session with focus on sit <> stand, dynamic standing balance, and overall endurance.  Pt received upright in recliner reporting fatigue but agreeable to therapy session.  Pt completed sit > stand and stand pivot transfer with RW with CGA.  Engaged in giant Connect 4 in standing with focus on sit <> stand and maintaining NWB through RLE during standing task.  Pt able to tolerate standing 2-3 mins at a time before requiring seated rest break.  Incorporated reaching  outside [place] during Connect 4 activity.  Therapist providing CGA for standing balance and min cues for NWB as pt fatigues.  Engaged in [place] endurance with zoom ball activity, with pt demonstrating fatigue with inability to complete multiple reps.  Returned to room with pt propelled w/c for strength and endurance.  Educated on energy conservation strategies, specifically pacing and spreading out challenging tasks both in regards to d/c home and upcoming trip.  Pt returned to recliner CGA with RW and remained upright with all needs in reach.  Therapy Documentation Precautions:  Precautions Precautions: Fall, Other (comment) Precaution Comments: R ankle fx with ORIF Required Braces or Orthoses: Splint/Cast Splint/Cast: R cast Restrictions Weight Bearing Restrictions: Yes RLE Weight Bearing: Non weight bearing Other Position/Activity Restrictions: R ankle soft cast General:   Vital Signs: Therapy Vitals Temp: 98.7 F (37.1 C) Pulse Rate: 76 BP: 122/64 Patient Position (if appropriate): Lying Oxygen Therapy SpO2: 95 % O2 Device: Room Air Pain: Pain Assessment Pain Scale: 0-10 Pain Score: 3  Pain Type: Surgical pain Pain Location: Ankle Pain Orientation: Right Pain Descriptors / Indicators: Aching;Nagging Pain Intervention(s):  (premedicated)   Therapy/Group: Individual Therapy  Simonne Come 07/13/2020, 7:45 AM

## 2020-07-13 NOTE — Progress Notes (Signed)
[place] PHYSICAL MEDICINE & REHABILITATION PROGRESS NOTE   Subjective/Complaints: No complaints this morning. Denies pain, constipation, insomnia. Had BM today. Increase irritation right ankle with new cast.  No complicating features on right ankle XR.   ROS: Denies CP, SOB, N/V/D  Objective:   DG Ankle Right Port  Result Date: 07/12/2020 CLINICAL DATA:  Ankle fractures.  Follow-up operative fixation. EXAM: PORTABLE RIGHT ANKLE - 2 VIEW COMPARISON:  Intraoperative spot films 07/01/2020 and ankle radiographs and CT scan 06/29/2020 FINDINGS: Fibular sideplate and multiple screws transfixing the fibular fracture with anatomic alignment. No complicating features are identified. The tibial fracture posteriorly is not well demonstrated. No obvious displacement on the lateral film. The ankle mortise is maintained. No tibial fractures. Probable tiny avulsion fracture off the medial talus. The mid and hindfoot bony structures are intact. There is a bony density along the dorsal aspect of the talus on the lateral film which could be an avulsion fracture. IMPRESSION: Internal fixation of the distal fibular fracture with anatomic alignment. No complicating features. Electronically Signed   By: Marijo Sanes M.D.   On: 07/12/2020 17:04   No results for input(s): WBC, HGB, HCT, PLT in the last 72 hours. Recent Labs    07/11/20 0619  NA 137  K 3.2*  CL 100  CO2 27  GLUCOSE 127*  BUN 12  CREATININE 1.10  CALCIUM 9.7    Intake/Output Summary (Last 24 hours) at 07/13/2020 0920 Last data filed at 07/13/2020 0527 Gross per 24 hour  Intake 460 ml  Output 500 ml  Net -40 ml        Physical Exam: Vital Signs Blood pressure 122/64, pulse 76, temperature 98.7 F (37.1 C), resp. rate 18, height 6' (1.829 m), weight 120.1 kg, SpO2 95 %. General: Alert and oriented x 3, No apparent distress HEENT: Head is normocephalic, atraumatic, PERRLA, EOMI, sclera anicteric, oral mucosa pink and moist,  dentition intact, ext ear canals clear,  Neck: Supple without JVD or lymphadenopathy Heart: Reg rate and rhythm. No murmurs rubs or gallops Chest: CTA bilaterally without wheezes, rales, or rhonchi; no distress Abdomen: Soft, non-tender, non-distended, bowel sounds positive. Extremities: No clubbing, cyanosis, or edema. Pulses are 2+ Skin: RLE with dressing CDI Right anterior tibia with open lesion Psych: Normal mood.  Normal behavior. Musc: Left foot with equina valgus deformity Right lower extremity with edema and tenderness, improving Neuro: Alert Motor: Bilateral upper extremities: 5/5 proximal distal Right lower extremity: Hip flexion, knee extension 4-/5, ankle limited due to splint, wiggles toes Left lower extremity: 4+/5 proximal distal, ankle limited due to deformity     Assessment/Plan: 1. Functional deficits secondary to right bimalleolar ankle fracture status post ORIF complicated by pulmonary embolism which require 3+ hours per day of interdisciplinary therapy in a comprehensive inpatient rehab setting.  Physiatrist is providing close team supervision and 24 hour management of active medical problems listed below.  Physiatrist and rehab team continue to assess barriers to discharge/monitor patient progress toward functional and medical goals  Care Tool:  Bathing  Bathing activity did not occur: Refused Body parts bathed by patient: Right arm, Left arm, Chest, Abdomen, Front perineal area, Right upper leg, Left upper leg, Face, Buttocks   Body parts bathed by helper: Buttocks, Right lower leg, Left lower leg Body parts n/a: Right lower leg, Left lower leg   Bathing assist Assist Level: Contact Guard/Touching assist     Upper Body Dressing/Undressing Upper body dressing   What is the patient wearing?: Pull  over shirt    Upper body assist Assist Level: Set up assist    Lower Body Dressing/Undressing Lower body dressing      What is the patient wearing?:  Underwear/pull up, Pants     Lower body assist Assist for lower body dressing: Contact Guard/Touching assist     Toileting Toileting    Toileting assist Assist for toileting: Independent with assistive device Assistive Device Comment: urinal   Transfers Chair/bed transfer  Transfers assist     Chair/bed transfer assist level: Contact Guard/Touching assist Chair/bed transfer assistive device: Programmer, multimedia   Ambulation assist   Ambulation activity did not occur: Safety/medical concerns (R LE NWB status, generalized weakness, decreased balance, poor UE strength)  Assist level: Contact Guard/Touching assist Assistive device: Walker-rolling Max distance: 52ft   Walk 10 feet activity   Assist  Walk 10 feet activity did not occur: Safety/medical concerns (R LE NWB status, generalized weakness, decreased balance, poor UE strength)  Assist level: Contact Guard/Touching assist Assistive device: Walker-rolling   Walk 50 feet activity   Assist Walk 50 feet with 2 turns activity did not occur: Safety/medical concerns (R LE NWB status, generalized weakness, decreased balance, poor UE strength)         Walk 150 feet activity   Assist Walk 150 feet activity did not occur: Safety/medical concerns (R LE NWB status, generalized weakness, decreased balance, poor UE strength)         Walk 10 feet on uneven surface  activity   Assist Walk 10 feet on uneven surfaces activity did not occur: Safety/medical concerns (R LE NWB status, generalized weakness, decreased balance, poor UE strength)         Wheelchair     Assist Will patient use wheelchair at discharge?: Yes Type of Wheelchair: Manual    Wheelchair assist level: Supervision/Verbal cueing Max wheelchair distance: 138ft    Wheelchair 50 feet with 2 turns activity    Assist        Assist Level: Supervision/Verbal cueing   Wheelchair 150 feet activity     Assist       Assist Level: Supervision/Verbal cueing   Blood pressure 122/64, pulse 76, temperature 98.7 F (37.1 C), resp. rate 18, height 6' (1.829 m), weight 120.1 kg, SpO2 95 %.  Medical Problem List and Plan: 1.  Decreased functional mobility secondary to right bimalleolar ankle fracture status post ORIF 10/26/4172 complicated by pulmonary emboli.  Nonweightbearing x6 to 8 weeks  -Interdisciplinary Team Conference today  2.  Antithrombotics: -DVT/anticoagulation: Eliquis             -antiplatelet therapy: N/A 3. Pain Management/Chronic Back pain: Cymbalta 60 mg daily, oxycodone as needed.  Patient had been on oxycodone 5 to 10 mg every 6 hours as needed prior to admission as well as Flexeril and OxyContin 10 mg 3 times daily.   Oxycodone frequency increased to every 5 mg every 4 as needed  Controlled 10/6 4. Mood: Provide emotional support             -antipsychotic agents: N/A 5. Neuropsych: This patient is capable of making decisions on his own behalf. 6. Skin/Wound Care: Routine skin checks 7. Fluids/Electrolytes/Nutrition: Routine in and outs 8.  Acute blood loss anemia.    Hemoglobin 10.4 on 9/28  Continue to monitor 9.  Diabetes mellitus peripheral neuropathy.  Hemoglobin A1c 6.7.  Currently on Lantus insulin 10 units nightly.  Patient on Glucophage 250 mg daily prior to admission.  Metformin 250 daily started on 10/1  10/6: slightly elevated. Provided dietary education. Discussed what he has been eating.   Monitor with increased mobility  10.  Hyperlipidemia.  Zocor 11.  AKI.  Resolved.  Follow-up chemistries 12.  Incidental findings left adrenal mass.  Follow-up outpatient  13.  Hyponatremia  Sodium 137 on 10/4, labs ordered for tomorrow  Continue to monitor 14. Drug induced constipation  Bowel meds increased on 9/29, increased again on 9/30  10/6: Had BM today  Improving 15. Hypokalemia  K+ 3.2 on 10/4, labs ordered for tomorrow  Discussed with pharmacy, supplemented x1  day on 10/4   LOS: 9 days A FACE TO [place] 07/13/2020, 9:20 AM

## 2020-07-13 NOTE — Progress Notes (Signed)
Physical Therapy Weekly Progress Note  Patient Details  Name: Keith [name] MRN: [id number] Date of Birth: [birth year]  Beginning of progress report period: July 05, 2020 End of progress report period: July 13, 2020  Today's Date: 07/13/2020 PT Individual Time: 7893-8101 PT Individual Time Calculation (min): 55 min   Patient has met 3 of 3 short term goals. Pt is currently demonstrating steady progress towards long term goals. Pt currently requires supervision for bed mobility, CGA/min A for transfers depending on how low the surface is, CGA to ambulate 5f with RW, and supervision for WC mobility >1564f However, pt continues to demonstrate difficulty maintaining R LE NWB precautions, occasionally demonstrating TDWB due to fatigue and generalized weakness. Non-weight bearing alarm shoe used during therapy sessions for auditory feedback has improved pt's ability to maintain NWB precautions.   Patient continues to demonstrate the following deficits muscle weakness, decreased cardiorespiratoy endurance and decreased standing balance, decreased postural control, decreased balance strategies and difficulty maintaining precautions and therefore will continue to benefit from skilled PT intervention to increase functional independence with mobility.  Patient progressing toward long term goals..  Continue plan of care.  PT Short Term Goals Week 1:  PT Short Term Goal 1 (Week 1): pt will transfer sit<>stand with LRAD CGA PT Short Term Goal 1 - Progress (Week 1): Met PT Short Term Goal 2 (Week 1): Pt will transfer stand<>pivot with LRAD CGA PT Short Term Goal 2 - Progress (Week 1): Met PT Short Term Goal 3 (Week 1): Pt will initiate gait training PT Short Term Goal 3 - Progress (Week 1): Met Week 2:  PT Short Term Goal 1 (Week 2): STG=LTG due to LOS  Skilled Therapeutic Interventions/Progress Updates:  Ambulation/gait training;Discharge planning;Functional mobility training;Therapeutic  Activities;Balance/vestibular training;Disease management/prevention;Neuromuscular re-education;Skin care/wound management;Therapeutic Exercise;Wheelchair propulsion/positioning;DME/adaptive equipment instruction;Pain management;Splinting/orthotics;UE/LE Strength taining/ROM;Community reintegration;Functional electrical stimulation;Patient/family education;Stair training;UE/LE Coordination activities   Today's Interventions: Received pt sitting in recliner, pt agreeable to therapy, and denied any pain during session. Session with emphasis on functional mobility/transfers, generalized strengthening, dynamic standing balance/coordination, simulated car transfers, ambulation, and improved activity tolerance. Laboratory present to draw blood. R LE non-weight bearing alarm shoe donned throughout session for auditory feedback. Pt transferred recliner<>WC with RW stand<>pivot with CGA. Pt performed WC mobility [id number] bilateral UEs and supervision and transported remainder of way [facility] ortho gym [facility] due to UE fatigue. Pt performed simulated car transfer with RW and min A. Pt reported in his wife's jeep he is able to adjust the seat height to make it higher or lower for greater ease with transfer. [facility] and ambulated [id number] trials with RW and CGA. Pt frequently demonstrating TDWB rather than NWB on R LE however pt reported he felt like he wasn't putting weight on his foot and shoe alarm silent. Pt performed bilateral UE and L LE strengthening on Nustep at workload 5 for 8 minutes for a total of 417 steps with 2 rest breaks for improved cardiovascular endurance with emphasis on L LE quad strengthening. Pt required multiple rest breaks throughout session due to increased fatigue. Pt performed L LE strengthening on Kinetron at 20cm/sec for 1 minute x 2 trials with therapist providing manual counter resistance. Pt [facility] and transferred  WC<>recliner with RW CGA. Concluded session with pt sitting in recliner, needs within reach, and chair pad alarm on.   Therapy Documentation Precautions:  Precautions Precautions: Fall, Other (  comment) Precaution Comments: R ankle fx with ORIF Required Braces or Orthoses: Splint/Cast Splint/Cast: R cast Restrictions Weight Bearing Restrictions: Yes RLE Weight Bearing: Non weight bearing Other Position/Activity Restrictions: R ankle soft cast  Therapy/Group: Individual Therapy Alfonse Alpers PT, DPT  07/13/2020, 7:26 AM

## 2020-07-14 ENCOUNTER — Inpatient Hospital Stay (HOSPITAL_COMMUNITY): Payer: 59

## 2020-07-14 LAB — GLUCOSE, CAPILLARY
Glucose-Capillary: 130 mg/dL — ABNORMAL HIGH (ref 70–99)
Glucose-Capillary: 119 mg/dL — ABNORMAL HIGH (ref 70–99)
Glucose-Capillary: 115 mg/dL — ABNORMAL HIGH (ref 70–99)
Glucose-Capillary: 132 mg/dL — ABNORMAL HIGH (ref 70–99)

## 2020-07-14 MED ORDER — POTASSIUM CHLORIDE CRYS ER 20 MEQ PO TBCR
20.0000 meq | EXTENDED_RELEASE_TABLET | Freq: Every day | ORAL | Status: DC
  Administered 2020-07-14 – 2020-07-20 (×7): 20 meq via ORAL
  Filled 2020-07-14 (×7): qty 1

## 2020-07-14 NOTE — Plan of Care (Signed)
°  Problem: Consults Goal: RH GENERAL PATIENT EDUCATION Description: Patient education on plan of care Outcome: Progressing Goal: Skin Care Protocol Initiated - if Braden Score 18 or less Description: If consults are not indicated, leave blank or document N/A Outcome: Progressing Goal: Nutrition Consult-if indicated Outcome: Progressing Goal: Diabetes Guidelines if Diabetic/Glucose > 140 Description: If diabetic or lab glucose is > 140 mg/dl - Initiate Diabetes/Hyperglycemia Guidelines & Document Interventions  Outcome: Progressing   Problem: RH BOWEL ELIMINATION Goal: RH STG MANAGE BOWEL WITH ASSISTANCE Description: Manage with min assist Outcome: Progressing   Problem: RH BLADDER ELIMINATION Goal: RH STG MANAGE BLADDER WITH ASSISTANCE Description: Manage w/ min assist Outcome: Progressing   Problem: RH SKIN INTEGRITY Goal: RH STG SKIN FREE OF INFECTION/BREAKDOWN Description: Manage w/ min assist Outcome: Progressing   Problem: RH SAFETY Goal: RH STG ADHERE TO SAFETY PRECAUTIONS W/ASSISTANCE/DEVICE Description: Adhere to floor safety plan Outcome: Progressing Goal: RH STG DECREASED RISK OF FALL WITH ASSISTANCE Description: No falls during admission, use proper transfer method Outcome: Progressing   Problem: RH PAIN MANAGEMENT Goal: RH STG PAIN MANAGED AT OR BELOW PT'S PAIN GOAL Description: Manage pain with min assist  Outcome: Progressing   Problem: RH KNOWLEDGE DEFICIT GENERAL Goal: RH STG INCREASE KNOWLEDGE OF SELF CARE AFTER HOSPITALIZATION Description: Pt and family education Outcome: Progressing

## 2020-07-14 NOTE — Progress Notes (Signed)
[place] PHYSICAL MEDICINE & REHABILITATION PROGRESS NOTE   Subjective/Complaints: Patient seen sitting up in his chair this morning.  He states he slept well overnight.  He states he had some itching and discomfort with his new cast yesterday, but has adjusted to it now.  ROS: Denies CP, SOB, N/V/D  Objective:   No results found. No results for input(s): WBC, HGB, HCT, PLT in the last 72 hours. Recent Labs    07/13/20 1036  NA 138  K 3.3*  CL 99  CO2 27  GLUCOSE 123*  BUN 11  CREATININE 1.10  CALCIUM 10.2    Intake/Output Summary (Last 24 hours) at 07/14/2020 1652 Last data filed at 07/14/2020 1120 Gross per 24 hour  Intake 722 ml  Output 750 ml  Net -28 ml        Physical Exam: Vital Signs Blood pressure 116/70, pulse 78, temperature 98.2 F (36.8 C), resp. rate 18, height 6' (1.829 m), weight 120.1 kg, SpO2 94 %. Constitutional: No distress . Vital signs reviewed. HENT: Normocephalic.  Atraumatic. Eyes: EOMI. No discharge. Cardiovascular: No JVD.  RRR. Respiratory: Normal effort.  No stridor.  Bilateral clear to auscultation. GI: Non-distended.  BS +. Skin: Right lower extremity with cast CDI Right anterior tibia lesion Psych: Normal mood.  Normal behavior. Musc: Left foot with equinovalgus deformity Right lower extremity with edema and tenderness Neuro: Alert Motor: Bilateral upper extremities: 5/5 proximal distal Right lower extremity: Hip flexion, knee extension 4/5, ankle limited due to splint, wiggles toes Left lower extremity: 4+/5 proximal distal, ankle limited due to deformity  Assessment/Plan: 1. Functional deficits secondary to right bimalleolar ankle fracture status post ORIF complicated by pulmonary embolism which require 3+ hours per day of interdisciplinary therapy in a comprehensive inpatient rehab setting.  Physiatrist is providing close team supervision and 24 hour management of active medical problems listed below.  Physiatrist and  rehab team continue to assess barriers to discharge/monitor patient progress toward functional and medical goals  Care Tool:  Bathing  Bathing activity did not occur: Refused Body parts bathed by patient: Right arm, Left arm, Chest, Abdomen, Front perineal area, Right upper leg, Left upper leg, Face, Buttocks   Body parts bathed by helper: Buttocks, Right lower leg, Left lower leg Body parts n/a: Right lower leg, Left lower leg   Bathing assist Assist Level: Contact Guard/Touching assist     Upper Body Dressing/Undressing Upper body dressing   What is the patient wearing?: Pull over shirt    Upper body assist Assist Level: Set up assist    Lower Body Dressing/Undressing Lower body dressing      What is the patient wearing?: Underwear/pull up, Pants     Lower body assist Assist for lower body dressing: Contact Guard/Touching assist     Toileting Toileting    Toileting assist Assist for toileting: Independent with assistive device Assistive Device Comment: urinal   Transfers Chair/bed transfer  Transfers assist     Chair/bed transfer assist level: Contact Guard/Touching assist Chair/bed transfer assistive device: Programmer, multimedia   Ambulation assist   Ambulation activity did not occur: Safety/medical concerns (R LE NWB status, generalized weakness, decreased balance, poor UE strength)  Assist level: Contact Guard/Touching assist Assistive device: Walker-rolling Max distance: 46ft   Walk 10 feet activity   Assist  Walk 10 feet activity did not occur: Safety/medical concerns (R LE NWB status, generalized weakness, decreased balance, poor UE strength)  Assist level: Contact Guard/Touching assist Assistive device: Walker-rolling  Walk 50 feet activity   Assist Walk 50 feet with 2 turns activity did not occur: Safety/medical concerns (R LE NWB status, generalized weakness, decreased balance, poor UE strength)         Walk 150 feet  activity   Assist Walk 150 feet activity did not occur: Safety/medical concerns (R LE NWB status, generalized weakness, decreased balance, poor UE strength)         Walk 10 feet on uneven surface  activity   Assist Walk 10 feet on uneven surfaces activity did not occur: Safety/medical concerns (R LE NWB status, generalized weakness, decreased balance, poor UE strength)         Wheelchair     Assist Will patient use wheelchair at discharge?: Yes Type of Wheelchair: Manual    Wheelchair assist level: Supervision/Verbal cueing Max wheelchair distance: 153ft    Wheelchair 50 feet with 2 turns activity    Assist        Assist Level: Supervision/Verbal cueing   Wheelchair 150 feet activity     Assist      Assist Level: Supervision/Verbal cueing   Blood pressure 116/70, pulse 78, temperature 98.2 F (36.8 C), resp. rate 18, height 6' (1.829 m), weight 120.1 kg, SpO2 94 %.  Medical Problem List and Plan: 1.  Decreased functional mobility secondary to right bimalleolar ankle fracture status post ORIF 0/81/4481 complicated by pulmonary emboli.  Nonweightbearing x6 to 8 weeks  Continue CIR 2.  Antithrombotics: -DVT/anticoagulation: Eliquis             -antiplatelet therapy: N/A 3. Pain Management/Chronic Back pain: Cymbalta 60 mg daily, oxycodone as needed.  Patient had been on oxycodone 5 to 10 mg every 6 hours as needed prior to admission as well as Flexeril and OxyContin 10 mg 3 times daily.   Oxycodone frequency increased to every 5 mg every 4 as needed  Controlled with meds on 10/7 4. Mood: Provide emotional support             -antipsychotic agents: N/A 5. Neuropsych: This patient is capable of making decisions on his own behalf. 6. Skin/Wound Care: Routine skin checks 7. Fluids/Electrolytes/Nutrition: Routine in and outs 8.  Acute blood loss anemia.    Hemoglobin 10.4 on 9/28, labs ordered for tomorrow  Continue to monitor 9.  Diabetes mellitus  peripheral neuropathy.  Hemoglobin A1c 6.7.  Currently on Lantus insulin 10 units nightly.  Patient on Glucophage 250 mg daily prior to admission.    Metformin 250 daily started on 10/1  Mildly elevated on 10/7  Monitor with increased mobility  10.  Hyperlipidemia.  Zocor 11.  AKI.  Resolved.  Follow-up chemistries 12.  Incidental findings left adrenal mass.  Follow-up outpatient  13.  Hyponatremia  Sodium 138 on 10/6  Continue to monitor 14. Drug induced constipation  Bowel meds increased on 9/29, increased again on 9/30  Improving 15. Hypokalemia  K+ 3.3 on 10/6  Discussed with pharmacy again, daily supplementation started   LOS: 10 days A FACE TO FACE EVALUATION WAS PERFORMED  Chandni Gagan Lorie Phenix 07/14/2020, 4:52 PM

## 2020-07-14 NOTE — Progress Notes (Signed)
Patient ID: Keith [name], male   DOB: 1951/02/25, 69 y.o.   MRN: [id number] Spoke with pt to ask when wife can come in, she can come Tuesday 10/12 at 10:00 for education in prepartion for discharge 10/13.

## 2020-07-14 NOTE — Progress Notes (Signed)
Physical Therapy Session Note  Patient Details  Name: Keith [name] MRN: [id number] Date of Birth: 04/20/1951  Today's Date: 07/14/2020 PT Individual Time: 5670-1410 and 3013-1438  PT Individual Time Calculation (min): 57 min and 68 min  Short Term Goals: Week 1:  PT Short Term Goal 1 (Week 1): pt will transfer sit<>stand with LRAD CGA PT Short Term Goal 1 - Progress (Week 1): Met PT Short Term Goal 2 (Week 1): Pt will transfer stand<>pivot with LRAD CGA PT Short Term Goal 2 - Progress (Week 1): Met PT Short Term Goal 3 (Week 1): Pt will initiate gait training PT Short Term Goal 3 - Progress (Week 1): Met Week 2:  PT Short Term Goal 1 (Week 2): STG=LTG due to LOS  Skilled Therapeutic Interventions/Progress Updates:   Treatment Session 1: 1015-1112 57 min Received pt sitting in recliner, pt agreeable to therapy, and denied any pain during session. Session with emphasis on functional mobility/transfers, generalized strengthening, dynamic standing balance/coordination, ambulation, and improved activity tolerance. L ankle brace donned and R non-weight bearing alarm shoe donned during session for auditory feedback. Pt transferred recliner<>WC stand<>pivot with RW and CGA from elevated surface and therapist adjusted pt's RW and WC to correct height/fit and pt performed WC mobility 159f using bilateral UEs and supervision to therapy gym. Pt ambulated 199fx 1, 2458f 1, and 6ft10f1 with RW and CGA while maintaining R LE NWB precautions with non-weight bearing alarm shoe. Worked on core strengthening and trunk control crunching up from wedge and tossing ball against rebounder 2x12 reps and crunches from wedge with lateral ball taps 2x12 with supervision. Pt required multiple rest breaks throughout session due to increased fatigue. Worked on dynamic standing balance tossing horseshoes with R UE x 3 trials with CGA for balance while maintaining R LE NWB precautions. Pt transferred stand<>pivot mat<>WC  with RW CGA and [facility] Stand<>pivot WC<>recliner with RW CGA. Concluded session with pt sitting in recliner, needs within reach, and chair pad alarm on. Therapist provided ice for pt.    Treatment Session 2: [id number] Received pt sitting in recliner, pt agreeable to therapy, and denied any pain during session. Session with emphasis on functional mobility/transfers, generalized strengthening, dynamic standing balance/coordination, ambulation, and improved activity tolerance. L ankle brace donned during session. Pt transferred recliner<>WC stand<>pivot with RW and CGA from elevated surface and performed WC mobility 150ft14fng bilateral UEs and supervision to therapy gym. Pt transferred sit<>stand CGA at table in dayroom and worked on dynamic standing balance copying pictures on pegboard x 3 trials with CGA for balance. Pt [facility] and performed WC mobility 10ft 79fneven surfaces (ramp) x 2 trials with CGA. Pt performed bilateral UE strengthening on UBE at level 3 alternating 1 minute forward/1 minute backwards for 6 minutes total for improved cardiovascular endurance.  Pt ambulated 21ft x41fnd 13ft x 81fth RW and CGA while maintaining R LE NWB precautions. Pt required multiple rest breaks throughout session due to increased fatigue. [facility] and transferred stand<>pivot WC<>recliner with RW and CGA. Pt reported L LE fatigue at end of session. Concluded session with pt sitting in recliner, needs within reach, and chair pad alarm up.   Therapy Documentation Precautions:  Precautions Precautions: Fall, Other (comment) Precaution Comments: R ankle fx with ORIF Required Braces or Orthoses: Splint/Cast Splint/Cast: R cast Restrictions Weight Bearing  Restrictions: Yes RLE Weight Bearing: Non weight bearing Other Position/Activity Restrictions: R ankle soft cast  Therapy/Group: Individual Therapy Alfonse Alpers PT, DPT   07/14/2020, 7:27 AM

## 2020-07-14 NOTE — Progress Notes (Signed)
Occupational Therapy Session Note  Patient Details  Name: Keith [name] MRN: [id number] Date of Birth: Jun 17, 1951  Today's Date: 07/14/2020 OT Individual Time: 0700-0757 OT Individual Time Calculation (min): 57 min    Short Term Goals: Week 1:  OT Short Term Goal 1 (Week 1): Pt will complete bathing with min assist OT Short Term Goal 1 - Progress (Week 1): Met OT Short Term Goal 2 (Week 1): Pt will complete LB dressing with min assist at sit > stand level OT Short Term Goal 2 - Progress (Week 1): Met OT Short Term Goal 3 (Week 1): Pt will complete toilet transfers with min assist stand pivot OT Short Term Goal 3 - Progress (Week 1): Met OT Short Term Goal 4 (Week 1): Pt will complete toileting with min assist at sit > stand level OT Short Term Goal 4 - Progress (Week 1): Met  Skilled Therapeutic Interventions/Progress Updates:    1;1. Pt reporting no pain and agreeable to OT. Pt agreeable to shower level bathing seated on TTB in walk in shower and OT discusses current shower set up as well as coving cast with bag and tape. Pt completes all transfers with RW from elevated surfaces with CGA and good WB precaution adherence.   Bathing with S seated with lateral leans on TTB. Dressing UB with set up at sink  Grooming seated set up at sink in w/c LB dressing with CGA sit to stand at sink with VC for w/c positioning in proximity to sink for improved upright posture. Footwear at EOB with crossing LUE over RUE to don ted, ankle brace and shoe Toileting was completed with CGA with A for standing balance only during transfer and CM. Set up for hygiene for wet wash cloth/towel. Pt left on toilet with NT aware of positioning waiting for pt to pull cord to bathroom.   Exited session with pt seated on toilet   Therapy Documentation Precautions:  Precautions Precautions: Fall, Other (comment) Precaution Comments: R ankle fx with ORIF Required Braces or Orthoses: Splint/Cast Splint/Cast: R  cast Restrictions Weight Bearing Restrictions: Yes RLE Weight Bearing: Non weight bearing Other Position/Activity Restrictions: R ankle soft cast General:   Vital Signs: Therapy Vitals Temp: 98.4 F (36.9 C) Pulse Rate: 84 Resp: 18 BP: 123/75 Patient Position (if appropriate): Lying Oxygen Therapy SpO2: 94 % O2 Device: Room Air Pain: Pain Assessment Pain Scale: 0-10 Pain Score: 4  Pain Type: Surgical pain Pain Location: Ankle Pain Orientation: Right Pain Descriptors / Indicators: Throbbing Pain Frequency: Intermittent Pain Intervention(s): Medication (See eMAR) ADL: ADL Eating: Independent Where Assessed-Eating: Chair Upper Body Dressing: Setup Where Assessed-Upper Body Dressing: Edge of bed Lower Body Dressing: Moderate assistance Where Assessed-Lower Body Dressing: Edge of bed Toileting: Maximal assistance Where Assessed-Toileting: Bedside Commode (over toilet) Toilet Transfer: Moderate assistance Toilet Transfer Method: Stand pivot Toilet Transfer Equipment: Bedside commode (RW) Vision   Perception    Praxis   Exercises:   Other Treatments:     Therapy/Group: Individual Therapy  Tonny Branch 07/14/2020, 6:54 AM

## 2020-07-15 ENCOUNTER — Inpatient Hospital Stay (HOSPITAL_COMMUNITY): Payer: 59

## 2020-07-15 ENCOUNTER — Inpatient Hospital Stay (HOSPITAL_COMMUNITY): Payer: 59 | Admitting: Occupational Therapy

## 2020-07-15 LAB — CBC WITH DIFFERENTIAL/PLATELET
Abs Immature Granulocytes: 0.02 10*3/uL (ref 0.00–0.07)
Basophils Absolute: 0 10*3/uL (ref 0.0–0.1)
Basophils Relative: 0 %
Eosinophils Absolute: 0.1 10*3/uL (ref 0.0–0.5)
Eosinophils Relative: 1 %
HCT: 31.5 % — ABNORMAL LOW (ref 39.0–52.0)
Hemoglobin: 10.4 g/dL — ABNORMAL LOW (ref 13.0–17.0)
Immature Granulocytes: 0 %
Lymphocytes Relative: 16 %
Lymphs Abs: 1.1 10*3/uL (ref 0.7–4.0)
MCH: 31 pg (ref 26.0–34.0)
MCHC: 33 g/dL (ref 30.0–36.0)
MCV: 93.8 fL (ref 80.0–100.0)
Monocytes Absolute: 1.1 10*3/uL — ABNORMAL HIGH (ref 0.1–1.0)
Monocytes Relative: 15 %
Neutro Abs: 4.6 10*3/uL (ref 1.7–7.7)
Neutrophils Relative %: 68 %
Platelets: 403 10*3/uL — ABNORMAL HIGH (ref 150–400)
RBC: 3.36 MIL/uL — ABNORMAL LOW (ref 4.22–5.81)
RDW: 14.2 % (ref 11.5–15.5)
WBC: 6.9 10*3/uL (ref 4.0–10.5)
nRBC: 0 % (ref 0.0–0.2)

## 2020-07-15 LAB — GLUCOSE, CAPILLARY
Glucose-Capillary: 117 mg/dL — ABNORMAL HIGH (ref 70–99)
Glucose-Capillary: 125 mg/dL — ABNORMAL HIGH (ref 70–99)
Glucose-Capillary: 106 mg/dL — ABNORMAL HIGH (ref 70–99)
Glucose-Capillary: 102 mg/dL — ABNORMAL HIGH (ref 70–99)

## 2020-07-15 NOTE — Plan of Care (Signed)
  Problem: Consults Goal: RH GENERAL PATIENT EDUCATION Description: Patient education on plan of care Outcome: Progressing Goal: Skin Care Protocol Initiated - if Braden Score 18 or less Description: If consults are not indicated, leave blank or document N/A Outcome: Progressing Goal: Nutrition Consult-if indicated Outcome: Progressing Goal: Diabetes Guidelines if Diabetic/Glucose > 140 Description: If diabetic or lab glucose is > 140 mg/dl - Initiate Diabetes/Hyperglycemia Guidelines & Document Interventions  Outcome: Progressing   Problem: RH BOWEL ELIMINATION Goal: RH STG MANAGE BOWEL WITH ASSISTANCE Description: Manage with min assist Outcome: Progressing   Problem: RH BLADDER ELIMINATION Goal: RH STG MANAGE BLADDER WITH ASSISTANCE Description: Manage w/ min assist Outcome: Progressing   Problem: RH SKIN INTEGRITY Goal: RH STG SKIN FREE OF INFECTION/BREAKDOWN Description: Manage w/ min assist Outcome: Progressing   Problem: RH SAFETY Goal: RH STG ADHERE TO SAFETY PRECAUTIONS W/ASSISTANCE/DEVICE Description: Adhere to floor safety plan Outcome: Progressing Goal: RH STG DECREASED RISK OF FALL WITH ASSISTANCE Description: No falls during admission, use proper transfer method Outcome: Progressing   Problem: RH PAIN MANAGEMENT Goal: RH STG PAIN MANAGED AT OR BELOW PT'S PAIN GOAL Description: Manage pain with min assist  Outcome: Progressing   Problem: RH KNOWLEDGE DEFICIT GENERAL Goal: RH STG INCREASE KNOWLEDGE OF SELF CARE AFTER HOSPITALIZATION Description: Pt and family education Outcome: Progressing

## 2020-07-15 NOTE — Progress Notes (Signed)
Physical Therapy Session Note  Patient Details  Name: Keith [name] MRN: [id number] Date of Birth: Mar 02, 1951  Today's Date: 07/15/2020 PT Individual Time: 1100-1156 and 1415-1524 PT Individual Time Calculation (min): 56 min and 69 min  Short Term Goals: Week 1:  PT Short Term Goal 1 (Week 1): pt will transfer sit<>stand with LRAD CGA PT Short Term Goal 1 - Progress (Week 1): Met PT Short Term Goal 2 (Week 1): Pt will transfer stand<>pivot with LRAD CGA PT Short Term Goal 2 - Progress (Week 1): Met PT Short Term Goal 3 (Week 1): Pt will initiate gait training PT Short Term Goal 3 - Progress (Week 1): Met Week 2:  PT Short Term Goal 1 (Week 2): STG=LTG due to LOS  Skilled Therapeutic Interventions/Progress Updates:   Treatment Session 1: 1100-1156 56 min Received pt sitting in recliner, pt agreeable to therapy, and denied any pain during session but reported mild headache. Session with emphasis on functional mobility/transfers, generalized strengthening, dynamic standing balance/coordination, ambulation, and improved activity tolerance. Pt transferred recliner<>WC stand<>pivot with RW and CGA x 2 trials throughout session. Pt performed WC mobility 150f using bilateral UEs and supervision to therapy gym. Pt ambulated 360fx 2 trials with RW and CGA while maintaining R LE NWB status with alarm shoe on. Pt transferred sit<>stand with RW CGA and worked on dynamic standing balance tapping R LE to 2 cones 3x5 reps. Discussed HHPT vs OPPT upon discharge and pt ultimately deciding to pursue OPPT. Pt performed the following exercises sitting on mat with supervision and verbal cues for technique: -hip flexion x12 bilaterally with 1.5 lb ankle weight on L LE -LAQ x 12 bilaterally with 1.5lb ankle weight on L LE  -hip adduction ball squeezes x 12 with 5 second isometric hold -hamstring curls 2x10 bilaterally with blue TB for resistance on L LE and no resistance on R LE due to hamstring weakness -hip  abduction with blue TB 2x10 Therapist provided WC gloves for pt. Stand<>pivot mat<>WC with RW and CGA and [facility]. Concluded session with pt sitting in recliner, needs within reach, and chair pad alarm on.   Treatment Session 2: [id number] 50in Received pt sitting in recliner, pt agreeable to therapy, and denied any pain initially but reported increased pain in R LE at end of session and requested pain medication. Session with emphasis on functional mobility/transfers, generalized strengthening, dynamic standing balance/coordination, community navigation, ambulation, and improved activity tolerance. Pt transferred recliner<>WC stand<>pivot with RW and CGA x 2 trials throughout session. Pt performed WC mobility 15074fsing bilateral UEs and supervision to elevator and transported remainder of way outside to [facility] performed WC mobility 200f13f1 and 75ft7f over uneven surfaces (concrete and up/downhill) using bilateral UEs and supervision/CGA. Pt ambulated 29ft 64f RW CGA over uneven surfaces (concrete and up/downhill). [facility] and stand<>pivot WC<>Nustep with RW and CGA. Pt performed bilateral UE and L LE strengthening on Nustep at workload 5 for 10 minutes for a total of 486 steps for improved cardiovascular endurance. Pt transferred sit<>stand with RW x 7 reps with CGA and worked on dynamic standing balance bowling with R UE with cues to maintain R LE NWB precautions. [facility] Concluded session with pt sitting in recliner, needs within reach, and chair pad alarm on. RN notified to administer pain medication.   Therapy Documentation Precautions:  Precautions Precautions: Fall, Other (comment) Precaution Comments: R ankle fx with ORIF Required Braces or Orthoses: Splint/Cast Splint/Cast: R cast Restrictions Weight Bearing Restrictions: Yes RLE Weight Bearing: Non weight bearing Other  Position/Activity Restrictions: R ankle soft cast  Therapy/Group: Individual Therapy Alfonse Alpers PT, DPT   07/15/2020, 7:23 AM

## 2020-07-15 NOTE — Progress Notes (Signed)
Occupational Therapy Session Note  Patient Details  Name: Keith [name] MRN: [id number] Date of Birth: 03-29-1951  Today's Date: 07/15/2020 OT Individual Time: 0964-3838 OT Individual Time Calculation (min): 60 min    Short Term Goals: Week 2:  OT Short Term Goal 1 (Week 2): STG = LTGs due to remaining LOS  Skilled Therapeutic Interventions/Progress Updates:    Treatment session with focus on self-care retraining, sit > stand, dynamic standing balance, and BUE strengthening and endurance.  Pt received upright in bed reporting slight headache, premedicated.  Discussed shower from yesterday with pt pleased with ability to complete as well as he did.  Pt requested to wash at sink this date.  Completed sit > stand and stand pivot transfers with RW with CGA.  Pt completed bathing with lateral leans seated at sink.  CGA for sit > stand and standing balance when pulling underwear and pants over hips.  Pt demonstrating increased awareness of positioning of RLE to maintain NWB during sit > stand and dynamic standing.  Pt reports suspecting that PT will want to focus on ambulation, therefore pt requested to address UB.  Pt propelled w/c for BUE strengthening and endurance to therapy gym.  Engaged in 5 mins forward and 5 mins backwards on UBE resistance level 4.8 for strengthening and endurance.  Returned to room and passed off to nursing staff to assist pt with transfer back to recliner due to time constraints.    Therapy Documentation Precautions:  Precautions Precautions: Fall, Other (comment) Precaution Comments: R ankle fx with ORIF Required Braces or Orthoses: Splint/Cast Splint/Cast: R cast Restrictions Weight Bearing Restrictions: Yes RLE Weight Bearing: Non weight bearing Other Position/Activity Restrictions: R ankle soft cast General:   Vital Signs: Therapy Vitals Temp: 98.7 F (37.1 C) Pulse Rate: 76 Resp: 18 BP: 105/68 Patient Position (if appropriate): Lying Oxygen  Therapy SpO2: 100 % O2 Device: Room Air Pain: Pain Assessment Pain Scale: 0-10 Pain Score: 6  Pain Type: Acute pain Pain Location: Ankle Pain Orientation: Right Pain Descriptors / Indicators: Throbbing Pain Frequency: Intermittent Pain Onset: With Activity Patients Stated Pain Goal: 2 Pain Intervention(s): Medication (See eMAR) Multiple Pain Sites: No   Therapy/Group: Individual Therapy  Anaiya Wisinski, [place] 07/15/2020, 9:00 AM

## 2020-07-15 NOTE — Progress Notes (Signed)
PHYSICAL MEDICINE & REHABILITATION PROGRESS NOTE   Subjective/Complaints: Patient seen in his wheelchair work with therapy this morning.  He states he slept well overnight.  He denies complaints.  Patient is in good spirits and grateful about his current circumstances.   ROS: Denies CP, SOB, N/V/D  Objective:   No results found. Recent Labs    07/15/20 0603  WBC 6.9  HGB 10.4*  HCT 31.5*  PLT 403*   Recent Labs    07/13/20 1036  NA 138  K 3.3*  CL 99  CO2 27  GLUCOSE 123*  BUN 11  CREATININE 1.10  CALCIUM 10.2    Intake/Output Summary (Last 24 hours) at 07/15/2020 1129 Last data filed at 07/15/2020 0815 Gross per 24 hour  Intake 740 ml  Output 1050 ml  Net -310 ml        Physical Exam: Vital Signs Blood pressure 105/68, pulse 76, temperature 98.7 F (37.1 C), resp. rate 18, height 6' (1.829 m), weight 120.1 kg, SpO2 100 %. Constitutional: No distress . Vital signs reviewed. HENT: Normocephalic.  Atraumatic. Eyes: EOMI. No discharge. Cardiovascular: No JVD.  RRR. Respiratory: Normal effort.  No stridor.  Bilateral clear to auscultation. GI: Non-distended.  BS +. Skin: Right lower extremity with cast CDI Right anterior tibial lesion Psych: Normal mood.  Normal behavior. Musc: Left foot with equina valgus deformity Right lower extremity casted Neuro: Alert Motor: Bilateral upper extremities: 5/5 proximal distal Right lower extremity: Hip flexion, knee extension 4/5, ankle limited due to splint, wiggles toes, improving Left lower extremity: 4+/5 proximal distal, ankle limited due to deformity  Assessment/Plan: 1. Functional deficits secondary to right bimalleolar ankle fracture status post ORIF complicated by pulmonary embolism which require 3+ hours per day of interdisciplinary therapy in a comprehensive inpatient rehab setting.  Physiatrist is providing close team supervision and 24 hour management of active medical problems listed  below.  Physiatrist and rehab team continue to assess barriers to discharge/monitor patient progress toward functional and medical goals  Care Tool:  Bathing  Bathing activity did not occur: Refused Body parts bathed by patient: Right arm, Left arm, Chest, Abdomen, Front perineal area, Right upper leg, Left upper leg, Face, Buttocks   Body parts bathed by helper: Buttocks, Right lower leg, Left lower leg Body parts n/a: Right lower leg, Left lower leg   Bathing assist Assist Level: Contact Guard/Touching assist     Upper Body Dressing/Undressing Upper body dressing   What is the patient wearing?: Pull over shirt    Upper body assist Assist Level: Set up assist    Lower Body Dressing/Undressing Lower body dressing      What is the patient wearing?: Underwear/pull up, Pants     Lower body assist Assist for lower body dressing: Contact Guard/Touching assist     Toileting Toileting    Toileting assist Assist for toileting: Independent with assistive device Assistive Device Comment: urinal   Transfers Chair/bed transfer  Transfers assist     Chair/bed transfer assist level: Contact Guard/Touching assist Chair/bed transfer assistive device: Programmer, multimedia   Ambulation assist   Ambulation activity did not occur: Safety/medical concerns (R LE NWB status, generalized weakness, decreased balance, poor UE strength)  Assist level: Contact Guard/Touching assist Assistive device: Walker-rolling Max distance: 43ft   Walk 10 feet activity   Assist  Walk 10 feet activity did not occur: Safety/medical concerns (R LE NWB status, generalized weakness, decreased balance, poor UE strength)  Assist level: Contact  Guard/Touching assist Assistive device: Walker-rolling   Walk 50 feet activity   Assist Walk 50 feet with 2 turns activity did not occur: Safety/medical concerns (R LE NWB status, generalized weakness, decreased balance, poor UE strength)          Walk 150 feet activity   Assist Walk 150 feet activity did not occur: Safety/medical concerns (R LE NWB status, generalized weakness, decreased balance, poor UE strength)         Walk 10 feet on uneven surface  activity   Assist Walk 10 feet on uneven surfaces activity did not occur: Safety/medical concerns (R LE NWB status, generalized weakness, decreased balance, poor UE strength)         Wheelchair     Assist Will patient use wheelchair at discharge?: Yes Type of Wheelchair: Manual    Wheelchair assist level: Supervision/Verbal cueing Max wheelchair distance: 182ft    Wheelchair 50 feet with 2 turns activity    Assist        Assist Level: Supervision/Verbal cueing   Wheelchair 150 feet activity     Assist      Assist Level: Supervision/Verbal cueing   Blood pressure 105/68, pulse 76, temperature 98.7 F (37.1 C), resp. rate 18, height 6' (1.829 m), weight 120.1 kg, SpO2 100 %.  Medical Problem List and Plan: 1.  Decreased functional mobility secondary to right bimalleolar ankle fracture status post ORIF 3/66/4403 complicated by pulmonary emboli.  Nonweightbearing x6 to 8 weeks  Continue CIR 2.  Antithrombotics: -DVT/anticoagulation: Eliquis             -antiplatelet therapy: N/A 3. Pain Management/Chronic Back pain: Cymbalta 60 mg daily, oxycodone as needed.  Patient had been on oxycodone 5 to 10 mg every 6 hours as needed prior to admission as well as Flexeril and OxyContin 10 mg 3 times daily.   Oxycodone frequency increased to every 5 mg every 4 as needed  Controlled with meds on 10/8 4. Mood: Provide emotional support             -antipsychotic agents: N/A 5. Neuropsych: This patient is capable of making decisions on his own behalf. 6. Skin/Wound Care: Routine skin checks 7. Fluids/Electrolytes/Nutrition: Routine in and outs 8.  Acute blood loss anemia.    Hemoglobin 10.4 on 9/28, labs ordered for tomorrow  Continue to  monitor 9.  Diabetes mellitus peripheral neuropathy.  Hemoglobin A1c 6.7.  Currently on Lantus insulin 10 units nightly.  Patient on Glucophage 250 mg daily prior to admission.    Metformin 250 daily started on 10/1  Mildly elevated on 10/8  Monitor with increased mobility  10.  Hyperlipidemia.  Zocor 11.  AKI.  Resolved.   12.  Incidental findings left adrenal mass.  Follow-up outpatient  13.  Hyponatremia  Sodium 138 on 10/6  Continue to monitor 14. Drug induced constipation  Bowel meds increased on 9/29, increased again on 9/30  Improving 15. Hypokalemia  K+ 3.3 on 10/6, labs ordered for Monday  Discussed with pharmacy again, daily supplementation started   LOS: 11 days A FACE TO FACE EVALUATION WAS PERFORMED  Muriel Wilber Lorie Phenix 07/15/2020, 11:29 AM

## 2020-07-16 ENCOUNTER — Inpatient Hospital Stay (HOSPITAL_COMMUNITY): Payer: 59

## 2020-07-16 LAB — GLUCOSE, CAPILLARY
Glucose-Capillary: 117 mg/dL — ABNORMAL HIGH (ref 70–99)
Glucose-Capillary: 148 mg/dL — ABNORMAL HIGH (ref 70–99)
Glucose-Capillary: 102 mg/dL — ABNORMAL HIGH (ref 70–99)
Glucose-Capillary: 115 mg/dL — ABNORMAL HIGH (ref 70–99)

## 2020-07-16 NOTE — Progress Notes (Signed)
[place] PHYSICAL MEDICINE & REHABILITATION PROGRESS NOTE   Subjective/Complaints:  "pretty well situated".  Throbbing in ankles- meds help.  1 PT session today.      ROS:  Pt denies SOB, abd pain, CP, N/V/C/D, and vision changes   Objective:   No results found. Recent Labs    07/15/20 0603  WBC 6.9  HGB 10.4*  HCT 31.5*  PLT 403*   No results for input(s): NA, K, CL, CO2, GLUCOSE, BUN, CREATININE, CALCIUM in the last 72 hours.  Intake/Output Summary (Last 24 hours) at 07/16/2020 1437 Last data filed at 07/16/2020 1315 Gross per 24 hour  Intake 1062 ml  Output 875 ml  Net 187 ml        Physical Exam: Vital Signs Blood pressure 108/75, pulse 73, temperature 98.4 F (36.9 C), temperature source Oral, resp. rate 18, height 6' (1.829 m), weight 120.1 kg, SpO2 100 %. Constitutional: No distress . Vital signs reviewed. Sitting up in bed, NAD HENT: Normocephalic.  Atraumatic. Eyes: EOMI. No discharge. Cardiovascular: RRR Respiratory:  Pt denies SOB, abd pain, CP, N/V/C/D, and vision changes GI: Soft, NT, ND, (+)BS  Skin: Right lower extremity with cast CDI Right anterior tibial lesion Psych: Normal mood.  Normal behavior. Musc: Left foot with equina valgus deformity Right lower extremity casted- wiggles RLE toes- warm to touch Neuro: Alert Motor: Bilateral upper extremities: 5/5 proximal distal Right lower extremity: Hip flexion, knee extension 4/5, ankle limited due to splint, wiggles toes, improving Left lower extremity: 4+/5 proximal distal, ankle limited due to deformity  Assessment/Plan: 1. Functional deficits secondary to right bimalleolar ankle fracture status post ORIF complicated by pulmonary embolism which require 3+ hours per day of interdisciplinary therapy in a comprehensive inpatient rehab setting.  Physiatrist is providing close team supervision and 24 hour management of active medical problems listed below.  Physiatrist and rehab team  continue to assess barriers to discharge/monitor patient progress toward functional and medical goals  Care Tool:  Bathing  Bathing activity did not occur: Refused Body parts bathed by patient: Right arm, Left arm, Chest, Abdomen, Front perineal area, Right upper leg, Left upper leg, Face, Buttocks, Right lower leg   Body parts bathed by helper: Buttocks, Right lower leg, Left lower leg Body parts n/a: Left lower leg   Bathing assist Assist Level: Set up assist     Upper Body Dressing/Undressing Upper body dressing   What is the patient wearing?: Pull over shirt    Upper body assist Assist Level: Set up assist    Lower Body Dressing/Undressing Lower body dressing      What is the patient wearing?: Underwear/pull up, Pants     Lower body assist Assist for lower body dressing: Set up assist     Toileting Toileting    Toileting assist Assist for toileting: Independent with assistive device Assistive Device Comment: urinal   Transfers Chair/bed transfer  Transfers assist     Chair/bed transfer assist level: Contact Guard/Touching assist Chair/bed transfer assistive device: Programmer, multimedia   Ambulation assist   Ambulation activity did not occur: Safety/medical concerns (R LE NWB status, generalized weakness, decreased balance, poor UE strength)  Assist level: Contact Guard/Touching assist Assistive device: Walker-rolling Max distance: 97ft   Walk 10 feet activity   Assist  Walk 10 feet activity did not occur: Safety/medical concerns (R LE NWB status, generalized weakness, decreased balance, poor UE strength)  Assist level: Contact Guard/Touching assist Assistive device: Walker-rolling   Walk 50 feet  activity   Assist Walk 50 feet with 2 turns activity did not occur: Safety/medical concerns (R LE NWB status, generalized weakness, decreased balance, poor UE strength)         Walk 150 feet activity   Assist Walk 150 feet activity  did not occur: Safety/medical concerns (R LE NWB status, generalized weakness, decreased balance, poor UE strength)         Walk 10 feet on uneven surface  activity   Assist Walk 10 feet on uneven surfaces activity did not occur: Safety/medical concerns (R LE NWB status, generalized weakness, decreased balance, poor UE strength)         Wheelchair     Assist Will patient use wheelchair at discharge?: Yes Type of Wheelchair: Manual    Wheelchair assist level: Supervision/Verbal cueing Max wheelchair distance: 13ft    Wheelchair 50 feet with 2 turns activity    Assist        Assist Level: Supervision/Verbal cueing   Wheelchair 150 feet activity     Assist      Assist Level: Supervision/Verbal cueing   Blood pressure 108/75, pulse 73, temperature 98.4 F (36.9 C), temperature source Oral, resp. rate 18, height 6' (1.829 m), weight 120.1 kg, SpO2 100 %.  Medical Problem List and Plan: 1.  Decreased functional mobility secondary to right bimalleolar ankle fracture status post ORIF 1/49/7026 complicated by pulmonary emboli.  Nonweightbearing x6 to 8 weeks  Continue CIR 2.  Antithrombotics: -DVT/anticoagulation: Eliquis             -antiplatelet therapy: N/A 3. Pain Management/Chronic Back pain: Cymbalta 60 mg daily, oxycodone as needed.  Patient had been on oxycodone 5 to 10 mg every 6 hours as needed prior to admission as well as Flexeril and OxyContin 10 mg 3 times daily.   Oxycodone frequency increased to every 5 mg every 4 as needed  10/9- pain controlled- con't meds 4. Mood: Provide emotional support             -antipsychotic agents: N/A 5. Neuropsych: This patient is capable of making decisions on his own behalf. 6. Skin/Wound Care: Routine skin checks 7. Fluids/Electrolytes/Nutrition: Routine in and outs 8.  Acute blood loss anemia.    Hemoglobin 10.4 on 9/28, labs ordered for tomorrow  Continue to monitor 9.  Diabetes mellitus peripheral  neuropathy.  Hemoglobin A1c 6.7.  Currently on Lantus insulin 10 units nightly.  Patient on Glucophage 250 mg daily prior to admission.    Metformin 250 daily started on 10/1  Mildly elevated on 10/8  10/9- low 100s BGS- con't regimen  Monitor with increased mobility  10.  Hyperlipidemia.  Zocor 11.  AKI.  Resolved.   12.  Incidental findings left adrenal mass.  Follow-up outpatient  13.  Hyponatremia  Sodium 138 on 10/6  Continue to monitor 14. Drug induced constipation  Bowel meds increased on 9/29, increased again on 9/30  Improving 15. Hypokalemia  K+ 3.3 on 10/6, labs ordered for Monday  Discussed with pharmacy again, daily supplementation started  10/9- labs Monday    LOS: 12 days A FACE TO FACE EVALUATION WAS PERFORMED  Leotha Westermeyer 07/16/2020, 2:37 PM

## 2020-07-16 NOTE — Progress Notes (Signed)
Occupational Therapy Session Note  Patient Details  Name: Keith [name] MRN: [id number] Date of Birth: December 01, 1950  Today's Date: 07/16/2020 OT Individual Time: 1030-1100 OT Individual Time Calculation (min): 30 min    Short Term Goals: Week 1:  OT Short Term Goal 1 (Week 1): Pt will complete bathing with min assist OT Short Term Goal 1 - Progress (Week 1): Met OT Short Term Goal 2 (Week 1): Pt will complete LB dressing with min assist at sit > stand level OT Short Term Goal 2 - Progress (Week 1): Met OT Short Term Goal 3 (Week 1): Pt will complete toilet transfers with min assist stand pivot OT Short Term Goal 3 - Progress (Week 1): Met OT Short Term Goal 4 (Week 1): Pt will complete toileting with min assist at sit > stand level OT Short Term Goal 4 - Progress (Week 1): Met  Skilled Therapeutic Interventions/Progress Updates:    1;1. Pt received in bed agreeable to OT. Pt requesting to bathe and dress at sink. Pt completes all transfers with RW and w/c with set up. Pt dresses footwear at EOB with setup and LB donning and doffing of underwear/shorts with S at sit to stand level. Pt completes UB bathing and dressing wth set up. Pt continues to groom at sink at end of sessio and pt left in w/c with pt seated in w/c and NT aware of positioning.   Therapy Documentation Precautions:  Precautions Precautions: Fall, Other (comment) Precaution Comments: R ankle fx with ORIF Required Braces or Orthoses: Splint/Cast Splint/Cast: R cast Restrictions Weight Bearing Restrictions: Yes RLE Weight Bearing: Non weight bearing Other Position/Activity Restrictions: R ankle soft cast General:   Vital Signs: Therapy Vitals Temp: 98.4 F (36.9 C) Temp Source: Oral Pulse Rate: 70 Resp: 18 BP: 115/71 Patient Position (if appropriate): Lying Oxygen Therapy SpO2: 98 % O2 Device: Room Air Pain:   ADL: ADL Eating: Independent Where Assessed-Eating: Chair Upper Body Dressing: Setup Where  Assessed-Upper Body Dressing: Edge of bed Lower Body Dressing: Moderate assistance Where Assessed-Lower Body Dressing: Edge of bed Toileting: Maximal assistance Where Assessed-Toileting: Bedside Commode (over toilet) Toilet Transfer: Moderate assistance Toilet Transfer Method: Stand pivot Toilet Transfer Equipment: Bedside commode (RW) Vision   Perception    Praxis   Exercises:   Other Treatments:     Therapy/Group: Individual Therapy  Tonny Branch 07/16/2020, 7:00 AM

## 2020-07-17 LAB — GLUCOSE, CAPILLARY
Glucose-Capillary: 109 mg/dL — ABNORMAL HIGH (ref 70–99)
Glucose-Capillary: 109 mg/dL — ABNORMAL HIGH (ref 70–99)
Glucose-Capillary: 155 mg/dL — ABNORMAL HIGH (ref 70–99)
Glucose-Capillary: 187 mg/dL — ABNORMAL HIGH (ref 70–99)

## 2020-07-17 NOTE — Progress Notes (Signed)
[place] PHYSICAL MEDICINE & REHABILITATION PROGRESS NOTE   Subjective/Complaints:   Pt reports no issues- 1 session Saturday.  Throbbing pain still, but meds help.  Pain keeps coming back every 4-5 hours.  Having cruise end of October, so needs to be perpared.   ROS:   Pt denies SOB, abd pain, CP, N/V/C/D, and vision changes  Objective:   No results found. Recent Labs    07/15/20 0603  WBC 6.9  HGB 10.4*  HCT 31.5*  PLT 403*   No results for input(s): NA, K, CL, CO2, GLUCOSE, BUN, CREATININE, CALCIUM in the last 72 hours.  Intake/Output Summary (Last 24 hours) at 07/17/2020 1447 Last data filed at 07/17/2020 0516 Gross per 24 hour  Intake 540 ml  Output 650 ml  Net -110 ml        Physical Exam: Vital Signs Blood pressure 119/71, pulse 72, temperature 98.3 F (36.8 C), resp. rate 18, height 6' (1.829 m), weight 120.1 kg, SpO2 98 %. Constitutional: sitting up in bed- appropriate, NAD HENT: Normocephalic.  Atraumatic. Eyes: EOMI. No discharge. Cardiovascular: RRR Respiratory: CTA B/L- no W/R/R- good air movement GI: Soft, NT, ND, (+)BS  Skin: Right lower extremity with cast CDI- wiggles toes and toes warm Right anterior tibial lesion Psych: appropriate Musc: Left foot with equina valgus deformity Right lower extremity casted- wiggles RLE toes- warm to touch Neuro: Alert Motor: Bilateral upper extremities: 5/5 proximal distal Right lower extremity: Hip flexion, knee extension 4/5, ankle limited due to splint, wiggles toes, improving Left lower extremity: 4+/5 proximal distal, ankle limited due to deformity  Assessment/Plan: 1. Functional deficits secondary to right bimalleolar ankle fracture status post ORIF complicated by pulmonary embolism which require 3+ hours per day of interdisciplinary therapy in a comprehensive inpatient rehab setting.  Physiatrist is providing close team supervision and 24 hour management of active medical problems listed  below.  Physiatrist and rehab team continue to assess barriers to discharge/monitor patient progress toward functional and medical goals  Care Tool:  Bathing  Bathing activity did not occur: Refused Body parts bathed by patient: Right arm, Left arm, Chest, Abdomen, Front perineal area, Right upper leg, Left upper leg, Face, Buttocks, Right lower leg   Body parts bathed by helper: Buttocks, Right lower leg, Left lower leg Body parts n/a: Left lower leg   Bathing assist Assist Level: Set up assist     Upper Body Dressing/Undressing Upper body dressing   What is the patient wearing?: Pull over shirt    Upper body assist Assist Level: Set up assist    Lower Body Dressing/Undressing Lower body dressing      What is the patient wearing?: Underwear/pull up, Pants     Lower body assist Assist for lower body dressing: Set up assist     Toileting Toileting    Toileting assist Assist for toileting: Independent with assistive device Assistive Device Comment: urinal   Transfers Chair/bed transfer  Transfers assist     Chair/bed transfer assist level: Contact Guard/Touching assist Chair/bed transfer assistive device: Programmer, multimedia   Ambulation assist   Ambulation activity did not occur: Safety/medical concerns (R LE NWB status, generalized weakness, decreased balance, poor UE strength)  Assist level: Contact Guard/Touching assist Assistive device: Walker-rolling Max distance: 32ft   Walk 10 feet activity   Assist  Walk 10 feet activity did not occur: Safety/medical concerns (R LE NWB status, generalized weakness, decreased balance, poor UE strength)  Assist level: Contact Guard/Touching assist Assistive device:  Walker-rolling   Walk 50 feet activity   Assist Walk 50 feet with 2 turns activity did not occur: Safety/medical concerns (R LE NWB status, generalized weakness, decreased balance, poor UE strength)         Walk 150 feet  activity   Assist Walk 150 feet activity did not occur: Safety/medical concerns (R LE NWB status, generalized weakness, decreased balance, poor UE strength)         Walk 10 feet on uneven surface  activity   Assist Walk 10 feet on uneven surfaces activity did not occur: Safety/medical concerns (R LE NWB status, generalized weakness, decreased balance, poor UE strength)         Wheelchair     Assist Will patient use wheelchair at discharge?: Yes Type of Wheelchair: Manual    Wheelchair assist level: Supervision/Verbal cueing Max wheelchair distance: 143ft    Wheelchair 50 feet with 2 turns activity    Assist        Assist Level: Supervision/Verbal cueing   Wheelchair 150 feet activity     Assist      Assist Level: Supervision/Verbal cueing   Blood pressure 119/71, pulse 72, temperature 98.3 F (36.8 C), resp. rate 18, height 6' (1.829 m), weight 120.1 kg, SpO2 98 %.  Medical Problem List and Plan: 1.  Decreased functional mobility secondary to right bimalleolar ankle fracture status post ORIF 7/68/1157 complicated by pulmonary emboli.  Nonweightbearing x6 to 8 weeks  10/10- plans on Cruise end of October, fyi  Continue CIR 2.  Antithrombotics: -DVT/anticoagulation: Eliquis             -antiplatelet therapy: N/A 3. Pain Management/Chronic Back pain: Cymbalta 60 mg daily, oxycodone as needed.  Patient had been on oxycodone 5 to 10 mg every 6 hours as needed prior to admission as well as Flexeril and OxyContin 10 mg 3 times daily.   Oxycodone frequency increased to every 5 mg every 4 as needed  1010/10- meds work, but a little frustrated has to ask every 4-5 hours when they wear off.  4. Mood: Provide emotional support             -antipsychotic agents: N/A 5. Neuropsych: This patient is capable of making decisions on his own behalf. 6. Skin/Wound Care: Routine skin checks 7. Fluids/Electrolytes/Nutrition: Routine in and outs 8.  Acute blood loss  anemia.    Hemoglobin 10.4 on 9/28, labs ordered for tomorrow  Continue to monitor 9.  Diabetes mellitus peripheral neuropathy.  Hemoglobin A1c 6.7.  Currently on Lantus insulin 10 units nightly.  Patient on Glucophage 250 mg daily prior to admission.    Metformin 250 daily started on 10/1  Mildly elevated on 10/8  10/9- low 100s BGS- con't regimen  10/10- BGs 102-140- con't regimen  Monitor with increased mobility  10.  Hyperlipidemia.  Zocor 11.  AKI.  Resolved.   12.  Incidental findings left adrenal mass.  Follow-up outpatient  13.  Hyponatremia  Sodium 138 on 10/6  Continue to monitor 14. Drug induced constipation  Bowel meds increased on 9/29, increased again on 9/30  Improving 15. Hypokalemia  K+ 3.3 on 10/6, labs ordered for Monday  Discussed with pharmacy again, daily supplementation started  10/10- labs Monday    LOS: 13 days A FACE TO FACE EVALUATION WAS PERFORMED  Keelin Neville 07/17/2020, 2:47 PM

## 2020-07-18 ENCOUNTER — Inpatient Hospital Stay (HOSPITAL_COMMUNITY): Payer: 59

## 2020-07-18 ENCOUNTER — Inpatient Hospital Stay (HOSPITAL_COMMUNITY): Payer: 59 | Admitting: Occupational Therapy

## 2020-07-18 DIAGNOSIS — R7309 Other abnormal glucose: Secondary | ICD-10-CM

## 2020-07-18 LAB — CBC
HCT: 32.1 % — ABNORMAL LOW (ref 39.0–52.0)
Hemoglobin: 10.1 g/dL — ABNORMAL LOW (ref 13.0–17.0)
MCH: 29.4 pg (ref 26.0–34.0)
MCHC: 31.5 g/dL (ref 30.0–36.0)
MCV: 93.6 fL (ref 80.0–100.0)
Platelets: 360 10*3/uL (ref 150–400)
RBC: 3.43 MIL/uL — ABNORMAL LOW (ref 4.22–5.81)
RDW: 14.1 % (ref 11.5–15.5)
WBC: 6.4 10*3/uL (ref 4.0–10.5)
nRBC: 0 % (ref 0.0–0.2)

## 2020-07-18 LAB — GLUCOSE, CAPILLARY
Glucose-Capillary: 125 mg/dL — ABNORMAL HIGH (ref 70–99)
Glucose-Capillary: 141 mg/dL — ABNORMAL HIGH (ref 70–99)
Glucose-Capillary: 96 mg/dL (ref 70–99)
Glucose-Capillary: 116 mg/dL — ABNORMAL HIGH (ref 70–99)

## 2020-07-18 NOTE — Progress Notes (Signed)
Occupational Therapy Session Note  Patient Details  Name: Keith [name] MRN: [id number] Date of Birth: Jul 29, 1951  Today's Date: 07/18/2020 OT Individual Time: 1100-1200 OT Individual Time Calculation (min): 60 min    Short Term Goals: Week 1:  OT Short Term Goal 1 (Week 1): Pt will complete bathing with min assist OT Short Term Goal 1 - Progress (Week 1): Met OT Short Term Goal 2 (Week 1): Pt will complete LB dressing with min assist at sit > stand level OT Short Term Goal 2 - Progress (Week 1): Met OT Short Term Goal 3 (Week 1): Pt will complete toilet transfers with min assist stand pivot OT Short Term Goal 3 - Progress (Week 1): Met OT Short Term Goal 4 (Week 1): Pt will complete toileting with min assist at sit > stand level OT Short Term Goal 4 - Progress (Week 1): Met Week 2:  OT Short Term Goal 1 (Week 2): STG = LTGs due to remaining LOS  Skilled Therapeutic Interventions/Progress Updates:  Patient met seated in wc in agreement with OT treatment session. 0/10 pain at rest and with activity. Wc mobility to ortho gym without need for rest break. Patient engaged in therapeutic exercise on UE arm ergometer for 15 minutes on level 2.5 with RPM between 25-30. Total A for wc mobility to outdoor area near hospital atrium. Patient able to complete transfer from wc <> bench outdoors with use of RW and good adherence to WB precautions. BUE HEP seated on park bench with use of zoom ball. Patient also completed 1 set x15 reps of seated push-ups lifting buttocks ~6-8 inches off bench. Session concluded with patient seated in recliner with call bell within reach and all needs met.   Therapy Documentation Precautions:  Precautions Precautions: Fall, Other (comment) Precaution Comments: R ankle fx with ORIF Required Braces or Orthoses: Splint/Cast Splint/Cast: R cast Restrictions Weight Bearing Restrictions: Yes RLE Weight Bearing: Non weight bearing Other Position/Activity Restrictions:  R ankle soft cast General:    Therapy/Group: Individual Therapy  Margaux Engen R [place]-Davis 07/18/2020, 7:33 AM

## 2020-07-18 NOTE — Progress Notes (Signed)
Physical Therapy Session Note  Patient Details  Name: Keith [name] MRN: [id number] Date of Birth: Jun 29, 1951  Today's Date: 07/18/2020 PT Individual Time: 1344-1456 PT Individual Time Calculation (min): 72 min   Short Term Goals: Week 1:  PT Short Term Goal 1 (Week 1): pt will transfer sit<>stand with LRAD CGA PT Short Term Goal 1 - Progress (Week 1): Met PT Short Term Goal 2 (Week 1): Pt will transfer stand<>pivot with LRAD CGA PT Short Term Goal 2 - Progress (Week 1): Met PT Short Term Goal 3 (Week 1): Pt will initiate gait training PT Short Term Goal 3 - Progress (Week 1): Met Week 2:  PT Short Term Goal 1 (Week 2): STG=LTG due to LOS  Skilled Therapeutic Interventions/Progress Updates:   Received pt sitting in recliner, pt agreeable to therapy, and denied any pain during session but reported soreness in R quad. Session with emphasis on functional mobility/transfers, generalized strengthening, dynamic standing balance/coordination, ambulation, and improved activity tolerance. Pt transferred recliner<>WC stand<>pivot with RW and CGA/close supervision x 2 trials throughout session. Pt performed WC mobility 161f x 2 trials to/from dayroom using bilateral UEs and supervision and able to manage WC parts (legrests and brakes) with supervision. Pt ambulated 244fwith RW CGA while maintaining R LE NWB precautions. Pt performed 5x sit<>stand with RW and close supervision in 28 seconds. Educated pt on significance of tests results and increased fall risk; pt verbalized understanding. Worked on dynamic standing balance playing cornhole x 3 trials with RW and CGA for balance. Therapist reassessed strength, sensation, and coordination and noted pt with hamstring and hip adductor weakness. Pt transferred sit<>stand x 3 reps and performed R LE toe taps to 3 colored cones based off therapist's cues 3x10 reps with CGA for balance. Worked on dynamic sitting balance and trunk control batting ball with 3lb  dowel x15 reps and with 4lb dowel 2x15 reps with supervision. Worked on dynamic standing balance clipping/unclipping clothespins with 1 UE support on RW x 2 trials with CGA/close supervision for balance. Pt required multiple rest breaks throughout session due to increased fatigue. Pt transferred sit<>stand with RW and close supervisoin x 2 trials and alternated lifting each UE from RW 2x8 reps with CGA and no LOB noted. Stand<>pivot mat<>WC with RW and close supervision. [facility] Concluded session with pt sitting in recliner, needs within reach, and chair pad alarm on.   Therapy Documentation Precautions:  Precautions Precautions: Fall, Other (comment) Precaution Comments: R ankle fx with ORIF Required Braces or Orthoses: Splint/Cast Splint/Cast: R cast Restrictions Weight Bearing Restrictions: Yes RLE Weight Bearing: Non weight bearing Other Position/Activity Restrictions: R ankle soft cast   Therapy/Group: Individual Therapy AnAlfonse AlpersT, DPT   07/18/2020, 7:34 AM

## 2020-07-18 NOTE — Plan of Care (Signed)
  Problem: Consults Goal: RH GENERAL PATIENT EDUCATION Description: Patient education on plan of care Outcome: Progressing Goal: Skin Care Protocol Initiated - if Braden Score 18 or less Description: If consults are not indicated, leave blank or document N/A Outcome: Progressing Goal: Nutrition Consult-if indicated Outcome: Progressing Goal: Diabetes Guidelines if Diabetic/Glucose > 140 Description: If diabetic or lab glucose is > 140 mg/dl - Initiate Diabetes/Hyperglycemia Guidelines & Document Interventions  Outcome: Progressing   Problem: RH BOWEL ELIMINATION Goal: RH STG MANAGE BOWEL WITH ASSISTANCE Description: Manage with min assist Outcome: Progressing   Problem: RH BLADDER ELIMINATION Goal: RH STG MANAGE BLADDER WITH ASSISTANCE Description: Manage w/ min assist Outcome: Progressing   Problem: RH SKIN INTEGRITY Goal: RH STG SKIN FREE OF INFECTION/BREAKDOWN Description: Manage w/ min assist Outcome: Progressing   Problem: RH SAFETY Goal: RH STG ADHERE TO SAFETY PRECAUTIONS W/ASSISTANCE/DEVICE Description: Adhere to floor safety plan Outcome: Progressing Goal: RH STG DECREASED RISK OF FALL WITH ASSISTANCE Description: No falls during admission, use proper transfer method Outcome: Progressing   Problem: RH PAIN MANAGEMENT Goal: RH STG PAIN MANAGED AT OR BELOW PT'S PAIN GOAL Description: Manage pain with min assist  Outcome: Progressing   Problem: RH KNOWLEDGE DEFICIT GENERAL Goal: RH STG INCREASE KNOWLEDGE OF SELF CARE AFTER HOSPITALIZATION Description: Pt and family education Outcome: Progressing

## 2020-07-18 NOTE — Progress Notes (Signed)
[place] PHYSICAL MEDICINE & REHABILITATION PROGRESS NOTE   Subjective/Complaints: Patient seen sitting up in his chair this morning.  He states he slept well overnight.  He states he had a good weekend.  He is working with therapies.  He notes some ankle pain overnight, but it improved with medications.  ROS: Denies CP, SOB, N/V/D  Objective:   No results found. Recent Labs    07/18/20 0633  WBC 6.4  HGB 10.1*  HCT 32.1*  PLT 360   No results for input(s): NA, K, CL, CO2, GLUCOSE, BUN, CREATININE, CALCIUM in the last 72 hours.  Intake/Output Summary (Last 24 hours) at 07/18/2020 1302 Last data filed at 07/18/2020 1145 Gross per 24 hour  Intake 500 ml  Output 975 ml  Net -475 ml        Physical Exam: Vital Signs Blood pressure 105/72, pulse 73, temperature 98.3 F (36.8 C), temperature source Oral, resp. rate 17, height 6' (1.829 m), weight 120.1 kg, SpO2 96 %. Constitutional: No distress . Vital signs reviewed. HENT: Normocephalic.  Atraumatic. Eyes: EOMI. No discharge. Cardiovascular: No JVD.  RRR. Respiratory: Normal effort.  No stridor.  Bilateral clear to auscultation. GI: Non-distended.  BS +. Skin: Right lower extremity with cast CDI Psych: Normal mood.  Normal behavior. Musc: Left foot with equina valgus deformity Right lower extremity casted Neuro: Alert Motor: Bilateral upper extremities: 5/5 proximal distal Right lower extremity: Hip flexion, knee extension 4+-5/5, ankle limited due to cast, wiggles toes Left lower extremity: 4+/5 proximal distal, ankle limited due to deformity  Assessment/Plan: 1. Functional deficits secondary to right bimalleolar ankle fracture status post ORIF complicated by pulmonary embolism which require 3+ hours per day of interdisciplinary therapy in a comprehensive inpatient rehab setting.  Physiatrist is providing close team supervision and 24 hour management of active medical problems listed below.  Physiatrist and rehab  team continue to assess barriers to discharge/monitor patient progress toward functional and medical goals  Care Tool:  Bathing  Bathing activity did not occur: Refused Body parts bathed by patient: Right arm, Left arm, Chest, Abdomen, Front perineal area, Right upper leg, Left upper leg, Face, Buttocks, Left lower leg   Body parts bathed by helper: Buttocks, Right lower leg, Left lower leg Body parts n/a: Right lower leg   Bathing assist Assist Level: Set up assist     Upper Body Dressing/Undressing Upper body dressing   What is the patient wearing?: Pull over shirt    Upper body assist Assist Level: Set up assist    Lower Body Dressing/Undressing Lower body dressing      What is the patient wearing?: Underwear/pull up, Pants     Lower body assist Assist for lower body dressing: Supervision/Verbal cueing     Toileting Toileting    Toileting assist Assist for toileting: Independent with assistive device Assistive Device Comment: urinal   Transfers Chair/bed transfer  Transfers assist     Chair/bed transfer assist level: Supervision/Verbal cueing Chair/bed transfer assistive device: Programmer, multimedia   Ambulation assist   Ambulation activity did not occur: Safety/medical concerns (R LE NWB status, generalized weakness, decreased balance, poor UE strength)  Assist level: Contact Guard/Touching assist Assistive device: Walker-rolling Max distance: 64ft   Walk 10 feet activity   Assist  Walk 10 feet activity did not occur: Safety/medical concerns (R LE NWB status, generalized weakness, decreased balance, poor UE strength)  Assist level: Contact Guard/Touching assist Assistive device: Walker-rolling   Walk 50 feet activity  Assist Walk 50 feet with 2 turns activity did not occur: Safety/medical concerns (R LE NWB status, generalized weakness, decreased balance, poor UE strength)         Walk 150 feet activity   Assist Walk 150  feet activity did not occur: Safety/medical concerns (R LE NWB status, generalized weakness, decreased balance, poor UE strength)         Walk 10 feet on uneven surface  activity   Assist Walk 10 feet on uneven surfaces activity did not occur: Safety/medical concerns (R LE NWB status, generalized weakness, decreased balance, poor UE strength)         Wheelchair     Assist Will patient use wheelchair at discharge?: Yes Type of Wheelchair: Manual    Wheelchair assist level: Supervision/Verbal cueing Max wheelchair distance: 178ft    Wheelchair 50 feet with 2 turns activity    Assist        Assist Level: Supervision/Verbal cueing   Wheelchair 150 feet activity     Assist      Assist Level: Supervision/Verbal cueing   Blood pressure 105/72, pulse 73, temperature 98.3 F (36.8 C), temperature source Oral, resp. rate 17, height 6' (1.829 m), weight 120.1 kg, SpO2 96 %.  Medical Problem List and Plan: 1.  Decreased functional mobility secondary to right bimalleolar ankle fracture status post ORIF 03/27/3558 complicated by pulmonary emboli.  Nonweightbearing x6 to 8 weeks  Continue CIR 2.  Antithrombotics: -DVT/anticoagulation: Eliquis             -antiplatelet therapy: N/A 3. Pain Management/Chronic Back pain: Cymbalta 60 mg daily, oxycodone as needed.  Patient had been on oxycodone 5 to 10 mg every 6 hours as needed prior to admission as well as Flexeril and OxyContin 10 mg 3 times daily.   Oxycodone frequency increased to every 5 mg every 4 as needed  Controlled with meds on 10/11 4. Mood: Provide emotional support             -antipsychotic agents: N/A 5. Neuropsych: This patient is capable of making decisions on his own behalf. 6. Skin/Wound Care: Routine skin checks 7. Fluids/Electrolytes/Nutrition: Routine in and outs 8.  Acute blood loss anemia.    Hemoglobin 10.1 on 10/11  Continue to monitor 9.  Diabetes mellitus peripheral neuropathy.   Hemoglobin A1c 6.7.  Currently on Lantus insulin 10 units nightly.  Patient on Glucophage 250 mg daily prior to admission.    Metformin 250 daily started on 10/1  Labile on 10/11, monitor for trend  Monitor with increased mobility  10.  Hyperlipidemia.  Zocor 11.  AKI.  Resolved.   12.  Incidental findings left adrenal mass.  Follow-up outpatient  13.  Hyponatremia  Sodium 138 on 10/6  Continue to monitor 14. Drug induced constipation  Bowel meds increased on 9/29, increased again on 9/30  Improving overall 15. Hypokalemia  K+ 3.3 on 10/6, labs ordered for tomorrow  Discussed with pharmacy again, daily supplementation started    LOS: 14 days A FACE TO [place] 07/18/2020, 1:02 PM

## 2020-07-18 NOTE — Discharge Summary (Addendum)
Physician Discharge Summary  Patient ID: KAMIL MCHAFFIE MRN: [id number] DOB/AGE: 68/03/1951 68 y.o.  Admit date: 07/04/2020 Discharge date: 07/20/2020  Discharge Diagnoses:  Principal Problem:   Debility Active Problems:   Pulmonary emboli (HCC)   Hyponatremia   Diabetic peripheral neuropathy (HCC)   Acute blood loss anemia   Chronic pain syndrome   Postoperative pain   Drug induced constipation   Hypokalemia   Labile blood glucose   Closed right ankle fracture Hyperlipidemia Incidental findings of left adrenal mass Tobacco abuse Right ankle trimalleolar fracture   Discharged Condition: Stable  Significant Diagnostic Studies: DG Ankle Complete Right  Result Date: 06/28/2020 CLINICAL DATA:  Right foot and ankle pain EXAM: RIGHT ANKLE - COMPLETE 3+ VIEW; RIGHT FOOT - 2 VIEW COMPARISON:  None. FINDINGS: Right ankle: Frontal, oblique, and lateral views demonstrate an oblique fracture of the distal right fibula, with mild displacement. Small ossific densities are seen within the medial aspect of the ankle mortise, consistent with avulsion fractures. Exact donor site is not identified, though likely from the medial malleolus. Coronally oriented minimally displaced fracture of the posterior malleolus is seen on the lateral view. There is dorsal subluxation of the talus relative to the tibial plafond. There is diffuse soft tissue edema. Right foot: Frontal and lateral views of the right foot demonstrate a small avulsion fracture off the dorsal distal margin of the talus. There is dorsal subluxation of the talus relative to the tibial plafond. No other acute bony abnormalities. IMPRESSION: 1. Trimalleolar fracture as above. 2. Dorsal subluxation of the talus relative to the tibial plafond. 3. Small avulsion fracture dorsal distal margin of the talus. 4. Diffuse soft tissue edema. Electronically Signed   By: Randa Ngo M.D.   On: 06/28/2020 20:44   CT ANKLE RIGHT WO CONTRAST  Result  Date: 06/29/2020 CLINICAL DATA:  Evaluate right ankle fracture. EXAM: CT OF THE RIGHT ANKLE WITHOUT CONTRAST TECHNIQUE: Multidetector CT imaging of the right ankle was performed according to the standard protocol. Multiplanar CT image reconstructions were also generated. COMPARISON:  Radiographs 06/28/2020 and 06/29/2020 FINDINGS: There is a mildly displaced oblique coursing fracture of the distal fibula at and above the level of the ankle mortise. Maximum displacement is 7 mm. Small avulsion type fractures involving the distal tip of the medial malleolus. Complex comminuted intra-articular fracture involving the posterior malleolar region of the tibia. There is comminution at the articular surface posteriorly and maximum displacement of the articular component is 6.5 mm. Small fracture fragments are noted in the anterior tibiotalar joint space. The talus is intact. The subtalar joints are maintained. No calcaneal fracture. The visualized midfoot bony structures are intact. Grossly by CT the medial and lateral and anterior ankle tendons and ligaments are intact. The Achilles tendon is intact. Moderate subcutaneous soft tissue swelling/edema/fluid/hematoma. IMPRESSION: 1. Complex comminuted intra-articular fracture involving the posterior malleolar region of the tibia. There is comminution at the articular surface posteriorly and maximum displacement of the articular component is 6.5 mm. 2. Small avulsion type fractures involving the distal tip of the medial malleolus. 3. Mildly displaced oblique coursing fracture of the distal fibula at and above the level of the ankle mortise. Maximum displacement is 7 mm. Electronically Signed   By: Marijo Sanes M.D.   On: 06/29/2020 13:10   DG Chest Portable 1 View  Result Date: 06/26/2020 CLINICAL DATA:  Chest pain, known pulmonary emboli EXAM: PORTABLE CHEST 1 VIEW COMPARISON:  06/26/2020 FINDINGS: Cardiac shadow is stable. Right jugular central line  is again noted and  stable. No pneumothorax is noted. The lungs are clear. IMPRESSION: No acute abnormality noted. Electronically Signed   By: Inez Catalina M.D.   On: 06/26/2020 22:30   DG Chest Portable 1 View  Result Date: 06/26/2020 CLINICAL DATA:  Check central line placement EXAM: PORTABLE CHEST 1 VIEW COMPARISON:  08/29/2014, CT from earlier in the same day. FINDINGS: Cardiac shadow is mildly prominent but stable. Right jugular central line is noted at the cavoatrial junction. No pneumothorax is seen. The overall inspiratory effort is poor although no focal infiltrate is seen. No bony abnormality is noted. IMPRESSION: No pneumothorax following central line placement as described. Electronically Signed   By: Inez Catalina M.D.   On: 06/26/2020 21:36   DG Ankle Right Port  Result Date: 07/12/2020 CLINICAL DATA:  Ankle fractures.  Follow-up operative fixation. EXAM: PORTABLE RIGHT ANKLE - 2 VIEW COMPARISON:  Intraoperative spot films 07/01/2020 and ankle radiographs and CT scan 06/29/2020 FINDINGS: Fibular sideplate and multiple screws transfixing the fibular fracture with anatomic alignment. No complicating features are identified. The tibial fracture posteriorly is not well demonstrated. No obvious displacement on the lateral film. The ankle mortise is maintained. No tibial fractures. Probable tiny avulsion fracture off the medial talus. The mid and hindfoot bony structures are intact. There is a bony density along the dorsal aspect of the talus on the lateral film which could be an avulsion fracture. IMPRESSION: Internal fixation of the distal fibular fracture with anatomic alignment. No complicating features. Electronically Signed   By: Marijo Sanes M.D.   On: 07/12/2020 17:04   DG Ankle Right Port  Result Date: 06/29/2020 CLINICAL DATA:  Right ankle fracture reduction, EXAM: PORTABLE RIGHT ANKLE - 2 VIEW COMPARISON:  06/28/2020 FINDINGS: Two view radiograph of the right ankle spur formed within an external  immobilizer which obscures fine bony detail. There has been interval reduction of tibiotalar subluxation. Medial joint space widening persists. Fracture fragments are in grossly anatomic alignment on this limited examination with persistent 2-3 mm posterior displacement and distraction of the distal fibular fracture fragment. IMPRESSION: Interval reduction of right tibiotalar subluxation Electronically Signed   By: Fidela Salisbury MD   On: 06/29/2020 00:46   DG Foot 2 Views Right  Result Date: 06/28/2020 CLINICAL DATA:  Right foot and ankle pain EXAM: RIGHT ANKLE - COMPLETE 3+ VIEW; RIGHT FOOT - 2 VIEW COMPARISON:  None. FINDINGS: Right ankle: Frontal, oblique, and lateral views demonstrate an oblique fracture of the distal right fibula, with mild displacement. Small ossific densities are seen within the medial aspect of the ankle mortise, consistent with avulsion fractures. Exact donor site is not identified, though likely from the medial malleolus. Coronally oriented minimally displaced fracture of the posterior malleolus is seen on the lateral view. There is dorsal subluxation of the talus relative to the tibial plafond. There is diffuse soft tissue edema. Right foot: Frontal and lateral views of the right foot demonstrate a small avulsion fracture off the dorsal distal margin of the talus. There is dorsal subluxation of the talus relative to the tibial plafond. No other acute bony abnormalities. IMPRESSION: 1. Trimalleolar fracture as above. 2. Dorsal subluxation of the talus relative to the tibial plafond. 3. Small avulsion fracture dorsal distal margin of the talus. 4. Diffuse soft tissue edema. Electronically Signed   By: Randa Ngo M.D.   On: 06/28/2020 20:44   DG MINI C-ARM IMAGE ONLY  Result Date: 07/01/2020 There is no interpretation for this exam.  This order is for images obtained during a surgical procedure.  Please See "Surgeries" Tab for more information regarding the procedure.    ECHOCARDIOGRAM COMPLETE  Result Date: 06/27/2020    ECHOCARDIOGRAM REPORT   Patient Name:   DEVAUNTE GASPARINI Date of Exam: 06/27/2020 Medical Rec #:  [id number]        Height:       72.0 in Accession #:    [id number]       Weight:       267.0 lb Date of Birth:  03/07/1951       BSA:          2.409 m Patient Age:    52 years         BP:           118/88 mmHg Patient Gender: M                HR:           81 bpm. Exam Location:  Inpatient Procedure: 2D Echo Indications:    pulmonary embolus 415.19  History:        Patient has no prior history of Echocardiogram examinations.                 Risk Factors:Diabetes and Hypertension.  Sonographer:    Johny Chess Referring Phys: [id number] LAURA R GLEASON  Sonographer Comments: Image acquisition challenging due to patient body habitus. IMPRESSIONS  1. Left ventricular ejection fraction, by estimation, is 60 to 65%. The left ventricle has normal function. The left ventricle has no regional wall motion abnormalities. There is mild concentric left ventricular hypertrophy. Left ventricular diastolic parameters are consistent with Grade I diastolic dysfunction (impaired relaxation). There is the interventricular septum is flattened in systole and diastole, consistent with right ventricular pressure and volume overload.  2. Right ventricular systolic function is moderately reduced. The right ventricular size is moderately enlarged. There is moderately elevated pulmonary artery systolic pressure. The estimated right ventricular systolic pressure is 51.8 mmHg.  3. The mitral valve is normal in structure. No evidence of mitral valve regurgitation. No evidence of mitral stenosis.  4. The aortic valve is tricuspid. Aortic valve regurgitation is not visualized. Mild aortic valve sclerosis is present, with no evidence of aortic valve stenosis.  5. The inferior vena cava is dilated in size with <50% respiratory variability, suggesting right atrial pressure of 15 mmHg. FINDINGS   Left Ventricle: Left ventricular ejection fraction, by estimation, is 60 to 65%. The left ventricle has normal function. The left ventricle has no regional wall motion abnormalities. The left ventricular internal cavity size was normal in size. There is  mild concentric left ventricular hypertrophy. The interventricular septum is flattened in systole and diastole, consistent with right ventricular pressure and volume overload. Left ventricular diastolic parameters are consistent with Grade I diastolic dysfunction (impaired relaxation). Normal left ventricular filling pressure. Right Ventricle: The right ventricular size is moderately enlarged. No increase in right ventricular wall thickness. Right ventricular systolic function is moderately reduced. There is moderately elevated pulmonary artery systolic pressure. The tricuspid  regurgitant velocity is 2.55 m/s, and with an assumed right atrial pressure of 15 mmHg, the estimated right ventricular systolic pressure is 84.1 mmHg. Left Atrium: Left atrial size was normal in size. Right Atrium: Right atrial size was normal in size. Pericardium: There is no evidence of pericardial effusion. Mitral Valve: The mitral valve is normal in structure. No evidence of mitral valve regurgitation. No evidence of mitral  valve stenosis. Tricuspid Valve: The tricuspid valve is normal in structure. Tricuspid valve regurgitation is mild . No evidence of tricuspid stenosis. Aortic Valve: The aortic valve is tricuspid. Aortic valve regurgitation is not visualized. Mild aortic valve sclerosis is present, with no evidence of aortic valve stenosis. Pulmonic Valve: The pulmonic valve was normal in structure. Pulmonic valve regurgitation is trivial. No evidence of pulmonic stenosis. Aorta: The aortic root is normal in size and structure. Venous: The inferior vena cava is dilated in size with less than 50% respiratory variability, suggesting right atrial pressure of 15 mmHg. IAS/Shunts: No  atrial level shunt detected by color flow Doppler.  LEFT VENTRICLE PLAX 2D LVIDd:         4.00 cm  Diastology LVIDs:         3.10 cm  LV e' medial:    5.98 cm/s LV PW:         1.30 cm  LV E/e' medial:  9.5 LV IVS:        1.40 cm  LV e' lateral:   6.20 cm/s LVOT diam:     2.00 cm  LV E/e' lateral: 9.1 LV SV:         47 LV SV Index:   20 LVOT Area:     3.14 cm  RIGHT VENTRICLE             IVC RV Basal diam:  4.71 cm     IVC diam: 2.30 cm RV S prime:     11.70 cm/s LEFT ATRIUM             Index       RIGHT ATRIUM           Index LA diam:        3.50 cm 1.45 cm/m  RA Area:     14.20 cm LA Vol (A2C):   45.8 ml 19.01 ml/m RA Volume:   33.10 ml  13.74 ml/m LA Vol (A4C):   40.1 ml 16.65 ml/m LA Biplane Vol: 43.2 ml 17.93 ml/m  AORTIC VALVE LVOT Vmax:   93.30 cm/s LVOT Vmean:  59.200 cm/s LVOT VTI:    0.150 m  AORTA Ao Root diam: 3.50 cm Ao Asc diam:  3.50 cm MV E velocity: 56.60 cm/s  TRICUSPID VALVE MV A velocity: 74.10 cm/s  TR Peak grad:   26.0 mmHg MV E/A ratio:  0.76        TR Vmax:        255.00 cm/s                             SHUNTS                            Systemic VTI:  0.15 m                            Systemic Diam: 2.00 cm Fransico Him MD Electronically signed by Fransico Him MD Signature Date/Time: 06/27/2020/11:01:08 AM    Final    CT Angio Chest/Abd/Pel for Dissection W and/or Wo Contrast  Result Date: 06/26/2020 CLINICAL DATA:  Abdominal pain, chest pain and nausea. EXAM: CT ANGIOGRAPHY CHEST, ABDOMEN AND PELVIS TECHNIQUE: Non-contrast CT of the chest was initially obtained. Multidetector CT imaging through the chest, abdomen and pelvis was performed using the standard protocol during bolus administration of intravenous  contrast. Multiplanar reconstructed images and MIPs were obtained and reviewed to evaluate the vascular anatomy. CONTRAST:  183mL OMNIPAQUE IOHEXOL 350 MG/ML SOLN COMPARISON:  January 19, 2012 FINDINGS: CTA CHEST FINDINGS Cardiovascular: An extensive amount of intraluminal low  attenuation is seen involving numerous right middle lobe, upper lobe and lower lobe branches of the bilateral pulmonary arteries. Saddle embolus is also seen. There is mild cardiomegaly with enlargement of the right ventricle noted. No pericardial effusion. Mediastinum/Nodes: No enlarged mediastinal, hilar, or axillary lymph nodes. Thyroid gland, trachea, and esophagus demonstrate no significant findings. Lungs/Pleura: Lungs are clear. No pleural effusion or pneumothorax. Musculoskeletal: Marked severity multilevel degenerative changes seen throughout the thoracic spine with marked severity scoliosis. Review of the MIP images confirms the above findings. CTA ABDOMEN AND PELVIS FINDINGS VASCULAR Aorta: Normal caliber aorta without aneurysm, dissection, vasculitis or significant stenosis. Celiac: Patent without evidence of aneurysm, dissection, vasculitis or significant stenosis. SMA: Patent without evidence of aneurysm, dissection, vasculitis or significant stenosis. Renals: Both renal arteries are patent without evidence of aneurysm, dissection, vasculitis, fibromuscular dysplasia or significant stenosis. IMA: Patent without evidence of aneurysm, dissection, vasculitis or significant stenosis. Inflow: Patent without evidence of aneurysm, dissection, vasculitis or significant stenosis. Veins: No obvious venous abnormality within the limitations of this arterial phase study. Review of the MIP images confirms the above findings. NON-VASCULAR Hepatobiliary: No focal liver abnormality is seen. No gallstones, gallbladder wall thickening, or biliary dilatation. Pancreas: Unremarkable. No pancreatic ductal dilatation or surrounding inflammatory changes. Spleen: Normal in size without focal abnormality. Adrenals/Urinary Tract: There is mild diffuse enlargement of the bilateral adrenal glands. A 2.2 cm x 1.9 cm low-attenuation left adrenal mass is also seen. Kidneys are normal in size, without renal calculi or hydronephrosis.  A 1.9 cm diameter simple cyst is seen within the posterolateral aspect of the mid left kidney. Bladder is unremarkable. Stomach/Bowel: There is a small hiatal hernia. Appendix appears normal. No evidence of bowel wall thickening, distention, or inflammatory changes. Noninflamed diverticula are seen throughout the sigmoid colon. Lymphatic: No abnormal abdominal or pelvic lymph nodes are seen. Reproductive: Prostate is unremarkable. Other: There is a 2.5 cm x 2.2 cm fat containing umbilical hernia. No abdominopelvic ascites. Musculoskeletal: Marked severity multilevel degenerative changes seen throughout the lumbar spine with marked severity scoliosis. Review of the MIP images confirms the above findings. IMPRESSION: 1. Extensive bilateral pulmonary emboli with saddle embolus and associated right heart strain. 2. 2.2 cm x 1.9 cm low-attenuation left adrenal mass which may represent an adrenal adenoma. 3. Noninflamed sigmoid diverticulosis. 4. Marked severity multilevel degenerative changes throughout the thoracic and lumbar spine with marked severity scoliosis. Electronically Signed   By: Virgina Norfolk M.D.   On: 06/26/2020 20:47   VAS [place] LOWER EXTREMITY VENOUS (DVT)  Result Date: 06/27/2020  Lower Venous DVTStudy Indications: Pulmonary embolism.  Comparison Study: No prior study on file Performing Technologist: Sharion Dove RVS  Examination Guidelines: A complete evaluation includes B-mode imaging, spectral Doppler, color Doppler, and power Doppler as needed of all accessible portions of each vessel. Bilateral testing is considered an integral part of a complete examination. Limited examinations for reoccurring indications may be performed as noted. The reflux portion of the exam is performed with the patient in reverse Trendelenburg.  +---------+---------------+---------+-----------+----------+--------------+ RIGHT    CompressibilityPhasicitySpontaneityPropertiesThrombus Aging  +---------+---------------+---------+-----------+----------+--------------+ CFV      Full           Yes      Yes                                 +---------+---------------+---------+-----------+----------+--------------+  SFJ      Full                                                        +---------+---------------+---------+-----------+----------+--------------+ FV Prox  Full                                                        +---------+---------------+---------+-----------+----------+--------------+ FV Mid   Full                                                        +---------+---------------+---------+-----------+----------+--------------+ FV DistalFull                                                        +---------+---------------+---------+-----------+----------+--------------+ PFV      Full                                                        +---------+---------------+---------+-----------+----------+--------------+ POP      Full           Yes      Yes                                 +---------+---------------+---------+-----------+----------+--------------+ PTV      Full                                                        +---------+---------------+---------+-----------+----------+--------------+ PERO     Full                                                        +---------+---------------+---------+-----------+----------+--------------+   +---------+---------------+---------+-----------+----------+--------------+ LEFT     CompressibilityPhasicitySpontaneityPropertiesThrombus Aging +---------+---------------+---------+-----------+----------+--------------+ CFV      Full           Yes      Yes                                 +---------+---------------+---------+-----------+----------+--------------+ SFJ      Full                                                         +---------+---------------+---------+-----------+----------+--------------+  FV Prox  Full                                                        +---------+---------------+---------+-----------+----------+--------------+ FV Mid   Full                                                        +---------+---------------+---------+-----------+----------+--------------+ FV DistalFull                                                        +---------+---------------+---------+-----------+----------+--------------+ PFV      Full                                                        +---------+---------------+---------+-----------+----------+--------------+ POP      None           No       Yes                  Acute          +---------+---------------+---------+-----------+----------+--------------+ PTV      Partial                                      Acute          +---------+---------------+---------+-----------+----------+--------------+ PERO     Partial                                      Acute          +---------+---------------+---------+-----------+----------+--------------+     Summary: RIGHT: - There is no evidence of deep vein thrombosis in the lower extremity.  LEFT: - Findings consistent with acute deep vein thrombosis involving the left popliteal vein, left posterior tibial veins, and left peroneal veins.  *See table(s) above for measurements and observations. Electronically signed by Ruta Hinds MD on 06/27/2020 at 5:21:22 PM.    Final     Labs:  Basic Metabolic Panel: Recent Labs  Lab 07/13/20 1036 07/19/20 0453  NA 138 139  K 3.3* 3.3*  CL 99 102  CO2 27 26  GLUCOSE 123* 128*  BUN 11 11  CREATININE 1.10 1.12  CALCIUM 10.2 9.9    CBC: Recent Labs  Lab 07/15/20 0603 07/18/20 0633  WBC 6.9 6.4  NEUTROABS 4.6  --   HGB 10.4* 10.1*  HCT 31.5* 32.1*  MCV 93.8 93.6  PLT 403* 360    CBG: Recent Labs  Lab 07/18/20 2039  07/19/20 0558 07/19/20 1149 07/19/20 1608 07/19/20 2105  GLUCAP 116* 136* 117* 97 143*   Family history.  Mother with diabetes and asthma Father with hypertension and CVA.  Denies colon  cancer esophageal cancer rectal cancer  Brief HPI:   Keith [name] is a 69 y.o. right-handed male with history of chronic back pain status post T11-T12 decompression maintained on OxyContin as well as oxycodone, hypertension, tobacco abuse, diabetes mellitus.  Patient with recent admission 3 weeks ago for acute on chronic back pain seen by neurosurgery.  MRI thoracic lumbar spine showed T11-12 cord compression with myelopathic findings despite an interval laminectomy.  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  Placed on IV steroids with good improvement.  He was to see Dr. Venetia Constable as an outpatient.  Per chart review lives with spouse two-level home independent with assistive device.  Presented 06/26/2020 after reported fall related to near syncopal event when he slipped on a wet floor without loss of consciousness sustaining a right ankle trimalleolar fracture dorsal subluxation of the talus.  Small avulsion fracture dorsal distal margin of the talus.  Admission chemistries glucose 328 creatinine 2.0 BUN 24 troponin 45.  He was somewhat hypotensive.  Placed on nonrebreather mask started on Levophed.  CT angiogram of the chest showed extensive bilateral pulmonary emboli with saddle embolus and associated right heart strain.  Incidental findings of a 2.2 cm x 1.9 cm low-attenuation left adrenal mass.  Lower extremity Dopplers acute DVT involving the left popliteal vein left posterior tibial vein left peroneal vein.  Patient was started on intravenous heparin echocardiogram showed ejection fraction of 60 to 65% no wall motion abnormalities grade 1 diastolic dysfunction.  Elevated troponin felt to be related to demand ischemia.  Orthopedic follow Dr. Mardelle Matte in regards to right bimalleolar ankle  fracture was cleared to undergo ORIF with fixation of fibula 07/01/2020.  Nonweightbearing x6 to 8 weeks.  Intravenous heparin transition to Eliquis after ankle surgery for pulmonary emboli.  Patient was admitted for a comprehensive rehab program.   Hospital Course: JAYMON DUDEK was admitted to rehab 07/04/2020 for inpatient therapies to consist of PT, ST and OT at least three hours five days a week. Past admission physiatrist, therapy team and rehab RN have worked together to provide customized collaborative inpatient rehab.  Pertaining to patient's right bimalleolar ankle fracture he had undergone ORIF 4/40/3474 without complications nonweightbearing 6 to 8 weeks neurovascular sensation intact.  He would follow-up with orthopedic services.  Eliquis ongoing for pulmonary emboli DVT no bleeding episodes.  Chronic back pain maintained on Cymbalta as well as oxycodone.  Blood pressure controlled with chlorthalidone as well as clonidine with Tenormin.  Mild hypokalemia with potassium supplement added.  Blood sugars monitored on Glucophage.  He was initially on Lantus insulin for a short time.  He would need follow-up with his PCP.  His hemoglobin A1c was 6.7.  Acute blood loss anemia stable no bleeding episodes latest hemoglobin 10.4 he continued on Zocor for hyperlipidemia.  Noted during hospital course incidental findings of left adrenal mass follow-up outpatient.  Drug-induced constipation resolved with laxative assistance.   Blood pressures were monitored on TID basis and controlled  Diabetes has been monitored with ac/hs CBG checks and SSI was use prn for tighter BS control.    Rehab course: During patient's stay in rehab weekly team conferences were held to monitor patient's progress, set goals and discuss barriers to discharge. At admission, patient required +2 physical assist sit to stand supervision supine to sit.  Set up upper body bathing moderate assist lower body bathing set up upper body  dressing moderate assist lower body dressing  Physical exam.  Blood pressure  118/70 pulse 80 temperature 98 respirations 18 oxygen saturations 92% room air HEENT Head.  Normocephalic and atraumatic Eyes.  Pupils round and reactive to light no discharge without nystagmus Neck.  Supple nontender no JVD without thyromegaly Cardiac regular rate rhythm without any extra sounds or murmur heard Abdomen.  Soft nontender positive bowel sounds without rebound Respiratory effort normal no respiratory distress without wheeze Extremities.  No clubbing cyanosis or edema Skin.  Right lower extremity is soft splint in place neurovascular sensation intact Neurological.  Patient cognitively intact    He/  has had improvement in activity tolerance, balance, postural control as well as ability to compensate for deficits. He/ has had improvement in functional use RUE/LUE  and RLE/LLE as well as improvement in awareness.  Sessions focused on functional mobility.  Patient transferred recliner to wheelchair stand pivot rolling walker contact-guard assist.  Ambulates 32 feet x 2 rolling walker contact-guard maintaining weightbearing precautions.  Transferred sit to stand rolling walker contact-guard assist and worked on dynamic standing balance.  Patient completes all transfers rolling walker ADLs with set up.  Patient dresses footwear edge of bed and set up and lower body donning and doffing of underwear shorts with supervision.  Completes upper body bathing and dressing set up.  Full family teaching completed plan discharge to home       Disposition: Discharged to home    Diet: Diabetic diet  Special Instructions: No driving smoking or alcohol  Nonweightbearing right lower extremity  Medications at discharge 1.  Eliquis 5 mg p.o. twice daily 2.  Tenormin 50 mg p.o. daily 3.  Chlorthalidone 25 mg daily 4.  Clonidine 0.1 mg p.o. daily 5.  Cymbalta 60 mg p.o. daily 6.  Glucophage 250 mg p.o. daily 7.   Oxycodone 5 to 10 mg every 6 hours as needed pain 8.  Zocor 40 mg nightly  9.  K. Dur 20 mEq daily  30-35 minutes were spent completing discharge summary and discharge planning  Discharge Instructions     Ambulatory referral to Physical Medicine Rehab   Complete by: As directed    Follow-up 1 month right bimalleolar ankle fracture        Follow-up Information     Jamse Arn, MD Follow up.   Specialty: Physical Medicine and Rehabilitation Why: Only as directed Contact information: [address] [place] [address] [phone]         Marchia Bond, MD Follow up.   Specialty: Orthopedic Surgery Why: Call for appointment Contact information: [place] [address] [phone]         Judith Part, MD Follow up.   Specialty: Neurosurgery Why: Call for appointment Contact information: [address] [place] [address] [phone]                 Signed: Cathlyn Parsons 07/20/2020, 5:00 AM Patient was seen, face-face, and physical exam performed by me on day of discharge, greater than 30 minutes of total time spent.. Please see progress note from day of discharge as well.  Delice Lesch, MD, ABPMR

## 2020-07-18 NOTE — Progress Notes (Signed)
Occupational Therapy Session Note  Patient Details  Name: Keith [name] MRN: [id number] Date of Birth: 06/12/1951  Today's Date: 07/18/2020 OT Individual Time: 1505-6979 OT Individual Time Calculation (min): 73 min    Short Term Goals: Week 2:  OT Short Term Goal 1 (Week 2): STG = LTGs due to remaining LOS  Skilled Therapeutic Interventions/Progress Updates:    Treatment session with focus on functional transfers, sit > stand, dynamic standing balance, and maintaining WB precautions during self-care tasks of bathing and dressing.  Pt received upright in bed agreeable to treatment session.  Pt plans to shower tomorrow when wife present for education, therefore requested to wash at sink.  Pt donned compression stocking, ankle brace, and shoe prior to standing at EOB.  Pt completed sit <> stand and stand pivot transfers throughout session with close supervision with RW.  Pt completed bathing at sit > stand level at sink with improved dynamic standing balance when washing buttocks.  Pt completed LB dressing at sit > stand level with supervision.  Pt applied leg rests and propelled w/c to Dayroom Mod I.  Engaged in bean bag toss in standing with focus on maintaining NWB during standing, while incorporating reaching and tossing with therapist providing CGA for dynamic standing balance.  Engaged in exercises with 5 # dumbbells to continue to address BUE strengthening as needed for functional mobility and endurance.  Pt returned to room and transferred to recliner CGA with RW and left upright with chair alarm on and all needs in reach.  Therapy Documentation Precautions:  Precautions Precautions: Fall, Other (comment) Precaution Comments: R ankle fx with ORIF Required Braces or Orthoses: Splint/Cast Splint/Cast: R cast Restrictions Weight Bearing Restrictions: Yes RLE Weight Bearing: Non weight bearing Other Position/Activity Restrictions: R ankle soft cast Pain: Pain Assessment Pain Scale:  0-10 Pain Score: 6  Pain Type: Acute pain;Surgical pain Pain Location: Ankle Pain Orientation: Right Pain Descriptors / Indicators: Aching;Throbbing Pain Frequency: Intermittent Pain Onset: With Activity Patients Stated Pain Goal: 1 Pain Intervention(s): Medication (See eMAR) Multiple Pain Sites: No   Therapy/Group: Individual Therapy  Simonne Come 07/18/2020, 9:44 AM

## 2020-07-19 ENCOUNTER — Encounter (HOSPITAL_COMMUNITY): Payer: 59 | Admitting: Occupational Therapy

## 2020-07-19 ENCOUNTER — Inpatient Hospital Stay (HOSPITAL_COMMUNITY): Payer: 59

## 2020-07-19 ENCOUNTER — Other Ambulatory Visit (HOSPITAL_COMMUNITY): Payer: Self-pay | Admitting: Physician Assistant

## 2020-07-19 ENCOUNTER — Ambulatory Visit (HOSPITAL_COMMUNITY): Payer: 59

## 2020-07-19 ENCOUNTER — Inpatient Hospital Stay (HOSPITAL_COMMUNITY): Payer: 59 | Admitting: Occupational Therapy

## 2020-07-19 DIAGNOSIS — S82891A Other fracture of right lower leg, initial encounter for closed fracture: Secondary | ICD-10-CM

## 2020-07-19 DIAGNOSIS — I10 Essential (primary) hypertension: Secondary | ICD-10-CM

## 2020-07-19 DIAGNOSIS — S82891D Other fracture of right lower leg, subsequent encounter for closed fracture with routine healing: Secondary | ICD-10-CM

## 2020-07-19 LAB — BASIC METABOLIC PANEL
Anion gap: 11 (ref 5–15)
BUN: 11 mg/dL (ref 8–23)
CO2: 26 mmol/L (ref 22–32)
Calcium: 9.9 mg/dL (ref 8.9–10.3)
Chloride: 102 mmol/L (ref 98–111)
Creatinine, Ser: 1.12 mg/dL (ref 0.61–1.24)
GFR, Estimated: >60 mL/min (ref >60)
Glucose, Bld: 128 mg/dL — ABNORMAL HIGH (ref 70–99)
Potassium: 3.3 mmol/L — ABNORMAL LOW (ref 3.5–5.1)
Sodium: 139 mmol/L (ref 135–145)

## 2020-07-19 LAB — GLUCOSE, CAPILLARY
Glucose-Capillary: 136 mg/dL — ABNORMAL HIGH (ref 70–99)
Glucose-Capillary: 117 mg/dL — ABNORMAL HIGH (ref 70–99)
Glucose-Capillary: 97 mg/dL (ref 70–99)
Glucose-Capillary: 143 mg/dL — ABNORMAL HIGH (ref 70–99)

## 2020-07-19 MED ORDER — POTASSIUM CHLORIDE CRYS ER 20 MEQ PO TBCR
30.0000 meq | EXTENDED_RELEASE_TABLET | Freq: Two times a day (BID) | ORAL | Status: AC
  Administered 2020-07-19 (×2): 30 meq via ORAL
  Filled 2020-07-19 (×2): qty 1

## 2020-07-19 MED ORDER — DULOXETINE HCL 30 MG PO CPEP: 60.0000 mg | ORAL_CAPSULE | Freq: Every day | ORAL | 3 refills | Status: DC

## 2020-07-19 MED ORDER — SENNOSIDES-DOCUSATE SODIUM 8.6-50 MG PO TABS: 2.0000 | ORAL_TABLET | Freq: Two times a day (BID) | ORAL | Status: DC

## 2020-07-19 MED ORDER — CLONIDINE HCL 0.1 MG PO TABS: 0.1000 mg | ORAL_TABLET | Freq: Every day | ORAL | 2 refills | Status: DC

## 2020-07-19 MED ORDER — POLYETHYLENE GLYCOL 3350 17 G PO PACK: 17.0000 g | PACK | Freq: Two times a day (BID) | ORAL | 0 refills | Status: DC

## 2020-07-19 MED ORDER — SIMVASTATIN 40 MG PO TABS: 40.0000 mg | ORAL_TABLET | Freq: Every day | ORAL | 2 refills | Status: DC

## 2020-07-19 MED ORDER — APIXABAN 5 MG PO TABS: 5.0000 mg | ORAL_TABLET | Freq: Two times a day (BID) | ORAL | Status: DC

## 2020-07-19 MED ORDER — POTASSIUM CHLORIDE CRYS ER 20 MEQ PO TBCR: 20.0000 meq | EXTENDED_RELEASE_TABLET | Freq: Every day | ORAL | 0 refills | Status: DC

## 2020-07-19 MED ORDER — ATENOLOL-CHLORTHALIDONE 50-25 MG PO TABS: 1.0000 | ORAL_TABLET | Freq: Every day | ORAL | 2 refills | Status: DC

## 2020-07-19 MED ORDER — METFORMIN HCL 500 MG PO TABS: 250.0000 mg | ORAL_TABLET | Freq: Every day | ORAL | 2 refills | Status: DC

## 2020-07-19 MED ORDER — OXYCODONE HCL 5 MG PO TABS: 5.0000 mg | ORAL_TABLET | Freq: Four times a day (QID) | ORAL | 0 refills | Status: DC | PRN

## 2020-07-19 MED FILL — SIMVASTATIN 40 MG TABLET: 40 | 90 days supply | Qty: 90 | Fill #0

## 2020-07-19 MED FILL — cloNIDine HCL 0.1 MG TABS: 0.1 | 90 days supply | Qty: 90 | Fill #0

## 2020-07-19 MED FILL — DULoxetine HCL 60 MG CPEP: 60 | 30 days supply | Qty: 30 | Fill #0

## 2020-07-19 MED FILL — ATENOLOL-CHLORTHAL 50-25mg: 50-25 | 30 days supply | Qty: 30 | Fill #0

## 2020-07-19 MED FILL — metFORMIN HCL 500 MG TABS: 500 | 90 days supply | Qty: 45 | Fill #0

## 2020-07-19 MED FILL — POTASSIUM CHLORIDE 20meqER: 20 | 30 days supply | Qty: 30 | Fill #0

## 2020-07-19 MED FILL — ELIQUIS 5 MG TABLET: 5 | 30 days supply | Qty: 60 | Fill #0

## 2020-07-19 MED FILL — oxyCODONE HCL 5 MG TABS: 5 | 7 days supply | Qty: 40 | Fill #0

## 2020-07-19 NOTE — Progress Notes (Signed)
Occupational Therapy Discharge Summary  Patient Details  Name: Keith [name] MRN: [id number] Date of Birth: Nov 29, 1950   Patient has met 10 of 10 long term goals due to improved activity tolerance, improved balance, postural control and ability to compensate for deficits.  Patient to discharge at overall Supervision level.  Patient's care partner is independent to provide the necessary supervision assistance at discharge.  Patient's wife present for education session and demonstrated competence with sit <> stand and stand pivot transfers.    Reasons goals not met: N/A  Recommendation:  Patient will not require follow up OT services at this time.  Equipment: 3 in 1  Reasons for discharge: treatment goals met and discharge from hospital  Patient/family agrees with progress made and goals achieved: Yes  OT Discharge Precautions/Restrictions  Precautions Precautions: Fall;Other (comment) Precaution Comments: R ankle fx with ORIF Required Braces or Orthoses: Splint/Cast Splint/Cast: R cast Restrictions Weight Bearing Restrictions: Yes RLE Weight Bearing: Non weight bearing Vital Signs Therapy Vitals Temp: 98.6 F (37 C) Pulse Rate: 79 Resp: 17 BP: 117/78 Patient Position (if appropriate): Sitting Oxygen Therapy SpO2: 99 % O2 Device: Room Air Pain Pain Assessment Pain Scale: 0-10 Pain Score: 3  Pain Type: Acute pain Pain Location: Ankle Pain Orientation: Right Pain Descriptors / Indicators: Throbbing Pain Frequency: Constant Pain Intervention(s): Medication (See eMAR) ADL ADL Eating: Independent Where Assessed-Eating: Chair Grooming: Modified independent Where Assessed-Grooming: Sitting at sink Upper Body Bathing: Supervision/safety Where Assessed-Upper Body Bathing: Shower Lower Body Bathing: Supervision/safety Where Assessed-Lower Body Bathing: Shower Upper Body Dressing: Modified independent (Device) Where Assessed-Upper Body Dressing: Wheelchair Lower  Body Dressing: Supervision/safety Where Assessed-Lower Body Dressing: Sitting at sink, Standing at sink Toileting: Supervision/safety Where Assessed-Toileting: Bedside Commode (over toilet) Toilet Transfer: Close supervision Toilet Transfer Method: Arts development officer: Bedside commode (RW) Social research officer, government: Close supervision Social research officer, government Method: Stand pivot Vision Baseline Vision/History: Wears glasses Wears Glasses: At all times Patient Visual Report: No change from baseline Vision Assessment?: No apparent visual deficits Perception  Perception: Within Functional Limits Praxis Praxis: Intact Cognition Overall Cognitive Status: Within Functional Limits for tasks assessed Arousal/Alertness: Awake/alert Orientation Level: Oriented X4 Memory: Appears intact Awareness: Appears intact Problem Solving: Appears intact Safety/Judgment: Appears intact Sensation Sensation Light Touch: Appears Intact Proprioception: Appears Intact Coordination Gross Motor Movements are Fluid and Coordinated: No Fine Motor Movements are Fluid and Coordinated: Yes Coordination and Movement Description: grossly uncoordinated due to R LE NWB status, generalized weakness, and decreased balance; improved since eval Finger Nose Finger Test: [facility] bilaterally Heel Shin Test: decreased ROM bilaterally Motor  Motor Motor: Abnormal postural alignment and control Motor - Skilled Clinical Observations: grossly uncoordinated due to R LE NWB status, generalized weakness, decreased balance; improved since eval Mobility  Bed Mobility Bed Mobility: Rolling Right;Rolling Left;Supine to Sit;Sit to Supine Rolling Right: Independent Rolling Left: Independent Supine to Sit: Independent Sit to Supine: Independent Transfers Sit to Stand: Supervision/Verbal cueing Stand to Sit: Supervision/Verbal cueing  Trunk/Postural Assessment  Cervical Assessment Cervical Assessment: Within  Functional Limits Thoracic Assessment Thoracic Assessment: Within Functional Limits Lumbar Assessment Lumbar Assessment: Exceptions to [facility] (posterior pelvic tilt) Postural Control Postural Control: Deficits on evaluation  Balance Balance Balance Assessed: Yes Static Sitting Balance Static Sitting - Balance Support: Feet supported;Bilateral upper extremity supported Static Sitting - Level of Assistance: 7: Independent Dynamic Sitting Balance Dynamic Sitting - Balance Support: Feet supported;Bilateral upper extremity supported Dynamic Sitting - Level of Assistance: 7: Independent Static Standing Balance Static Standing - Balance  Support: Bilateral upper extremity supported (RW) Static Standing - Level of Assistance: 5: Stand by assistance (supervision) Dynamic Standing Balance Dynamic Standing - Balance Support: Bilateral upper extremity supported (RW) Dynamic Standing - Level of Assistance: 5: Stand by assistance (supervision) Extremity/Trunk Assessment RUE Assessment RUE Assessment: Within Functional Limits LUE Assessment LUE Assessment: Within Functional Limits   Keith [name] 07/19/2020, 8:02 AM

## 2020-07-19 NOTE — Progress Notes (Signed)
Physical Therapy Session Note  Patient Details  Name: Keith [name] MRN: [id number] Date of Birth: 06-18-1951  Today's Date: 07/19/2020 PT Individual Time: 3664-4034 and 7425-9563 PT Individual Time Calculation (min): 47 min and 55 min  Short Term Goals: Week 1:  PT Short Term Goal 1 (Week 1): pt will transfer sit<>stand with LRAD CGA PT Short Term Goal 1 - Progress (Week 1): Met PT Short Term Goal 2 (Week 1): Pt will transfer stand<>pivot with LRAD CGA PT Short Term Goal 2 - Progress (Week 1): Met PT Short Term Goal 3 (Week 1): Pt will initiate gait training PT Short Term Goal 3 - Progress (Week 1): Met Week 2:  PT Short Term Goal 1 (Week 2): STG=LTG due to LOS  Skilled Therapeutic Interventions/Progress Updates:   Treatment Session 1: 1102-1149 47 min Received pt sitting in recliner, pt agreeable to therapy, and denied any pain during session. [name] present for family education training. Session with emphasis on discharge planning, functional mobility/transfers, generalized strengthening, dynamic standing balance/coordination, ambulation, and improved activity tolerance. Pt transferred recliner<>WC with RW stand<>pivot and supervision while maintaining R LE NWB precautions x 2 trials throughout session. Therapist educated pt's wife on body mechanics/positioning and to remain on pt's R side in case of LOB. Pt performed WC mobility 16ft x 2 trials and 17ft x 1 mod I. Pt performed WC mobility on 29ft uneven surfaces (ramp) x 2 trials. Trial 1 with therapist providing CGA and Trial 2 with pt's wife providing supervision. Pt ambulated 42ft x 1 and 44ft x 1 (with non weight bearing alarm shoe) with RW and supervision provided by pt's wife. Pt able to adhere to R LE NWB precautions throughout and with no LOB noted; only fatigue. In rehab apartment pt transferred stand<>pivot WC<>bed and bed<>WC with RW and supervision provided by pt's wife and performed bed mobility from flat bed  independently. Pt extremely fatigued at end of session requesting to rest. Concluded session with pt sitting in recliner, needs within reach, and wife present at bedside. Pt and pt's wife verbalized confidence with all tasks to ensure safe discharge home.   Treatment Session 2: 8756-4332 55 min Received pt sitting in recliner, pt agreeable to therapy, and denied any pain during session. Session with emphasis on discharge planning, functional mobility/transfers, generalized strengthening, dynamic standing balance/coordination, ambulation, and improved activity tolerance. Pt transferred recliner<>WC stand<>pivot with RW and supervision x 2 trials throughout session while maintaining R LE NWB precautions. Pt performed WC mobility 150ft using bilateral UEs mod I to therapy gym and ambulated 79ft with RW and supervision while maintaining R LE NWB precautions. Pt transferred sit<>stand x 4 trials from mat with RW and supervision and worked on dynamic standing balance playing connect four x 2 games with CGA for balance. Pt required multiple rest breaks throughout session due to increased fatigue. Pt performed R LE heel taps to 2in step 2x6 reps with CGA for balance. Pt performed the following exercises sitting on mat with supervision and verbal cues for technique: -hip flexion 2x8 bilaterally with 1lb ankle weight on L LE -LAQ 2x8 bilaterally with 1lb ankle weight on L LE -hip abduction with blue TB 2x8  Pt required verbal cues for breathing and to avoid valsalva while exercising. Stand<>pivot mat<>WC with RW and supervision and pt transported back to room in [facility] total A. Concluded session with pt sitting in recliner, needs within reach, and chair pad alarm on.   Therapy Documentation Precautions:  Precautions Precautions: Fall, Other (  comment) Precaution Comments: R ankle fx with ORIF Required Braces or Orthoses: Splint/Cast Splint/Cast: R cast Restrictions Weight Bearing Restrictions: Yes RLE Weight  Bearing: Non weight bearing Other Position/Activity Restrictions: R ankle soft cast  Therapy/Group: Individual Therapy Alfonse Alpers PT, DPT   07/19/2020, 7:49 AM

## 2020-07-19 NOTE — Progress Notes (Signed)
Occupational Therapy Session Note  Patient Details  Name: Keith [name] MRN: [id number] Date of Birth: 01/20/1951  Today's Date: 07/19/2020 OT Individual Time: 9924-2683 OT Individual Time Calculation (min): 25 min    Short Term Goals: Week 2:  OT Short Term Goal 1 (Week 2): STG = LTGs due to remaining LOS  Skilled Therapeutic Interventions/Progress Updates:  Patient met lying supine in bed. 4-5/10 throbbing pain in RLE. Patient medicated prior to start of tx session. Patient declined self-care tasks in prep for family education session later this a.m. Supine to EOB with Mod I. Sit to stand and stand-pivot transfer to recliner with CGA 2/2 morning stiffness. Patient completed BUE circuit with 5lb weighted dowel and additional 10lb weight on bar seated in recliner for 2 sets x15 reps each. Session concluded with patient seated in recliner with call bell within and all needs met.   Arm curls  Chest press Straight arm raises Military press  Therapy Documentation Precautions:  Precautions Precautions: Fall, Other (comment) Precaution Comments: R ankle fx with ORIF Required Braces or Orthoses: Splint/Cast Splint/Cast: R cast Restrictions Weight Bearing Restrictions: Yes RLE Weight Bearing: Non weight bearing Other Position/Activity Restrictions: R ankle soft cast General:    Therapy/Group: Individual Therapy  [name] R [place] 07/19/2020, 8:30 AM

## 2020-07-19 NOTE — Progress Notes (Signed)
Physical Therapy Discharge Summary  Patient Details  Name: Keith [name] MRN: [id number] Date of Birth: [birth year]  Patient has met 10 of 10 long term goals due to improved activity tolerance, improved balance, improved postural control, increased strength, decreased pain, improved awareness and improved coordination. Patient to discharge at a wheelchair level Supervision.  Patient's care partner is independent to provide the necessary physical assistance at discharge. [name] attended family education training on 10/12 and verbalized and demonstrated confidence with all tasks/transfers to ensure safe discharge home.   All goals met   Recommendation:  Patient will benefit from ongoing skilled PT services in outpatient setting to continue to advance safe functional mobility, address ongoing impairments in transfers, generalized strengthening, dynamic standing balance/coordination, ambulation, endurance, and to minimize fall risk.  Equipment: 20x18 manual WC with R elevating legrest, RW  Reasons for discharge: treatment goals met  Patient/family agrees with progress made and goals achieved: Yes  PT Discharge Precautions/Restrictions Precautions Precautions: Fall;Other (comment) Precaution Comments: R ankle fx with ORIF Required Braces or Orthoses: Splint/Cast Splint/Cast: R cast Restrictions Weight Bearing Restrictions: Yes RLE Weight Bearing: Non weight bearing Cognition Overall Cognitive Status: Within Functional Limits for tasks assessed Arousal/Alertness: Awake/alert Orientation Level: Oriented X4 Memory: Appears intact Awareness: Appears intact Problem Solving: Appears intact Safety/Judgment: Appears intact Sensation Sensation Light Touch: Appears Intact Proprioception: Appears Intact Coordination Gross Motor Movements are Fluid and Coordinated: No Fine Motor Movements are Fluid and Coordinated: Yes Coordination and Movement Description: grossly uncoordinated due  to R LE NWB status, generalized weakness, and decreased balance; improved since eval Finger Nose Finger Test: [facility] bilaterally Heel Shin Test: decreased ROM bilaterally Motor  Motor Motor: Abnormal postural alignment and control Motor - Skilled Clinical Observations: grossly uncoordinated due to R LE NWB status, generalized weakness, decreased balance; improved since eval  Mobility Bed Mobility Bed Mobility: Rolling Right;Rolling Left;Supine to Sit;Sit to Supine Rolling Right: Independent Rolling Left: Independent Supine to Sit: Independent Sit to Supine: Independent Transfers Transfers: Sit to Stand;Stand to Sit;Stand Pivot Transfers Sit to Stand: Supervision/Verbal cueing Stand to Sit: Supervision/Verbal cueing Stand Pivot Transfers: Supervision/Verbal cueing Stand Pivot Transfer Details: Verbal cues for precautions/safety Stand Pivot Transfer Details (indicate cue type and reason): occasional cues to maintain R LE NWB precautions Transfer (Assistive device): Rolling walker Locomotion  Gait Ambulation: Yes Gait Assistance: Supervision/Verbal cueing Gait Distance (Feet): 36 Feet Assistive device: Rolling walker Gait Assistance Details: Verbal cues for precautions/safety Gait Assistance Details: occasional verbal cues to maintain R LE NWB precautions Gait Gait: Yes Gait Pattern: Impaired Gait Pattern: Step-to pattern;Decreased trunk rotation;Decreased stride length;Antalgic;Decreased step length - left;Poor foot clearance - left Gait velocity: decreased Stairs / Additional Locomotion Stairs: No Architect: Yes Wheelchair Assistance: Independent with Camera operator: Both upper extremities Wheelchair Parts Management: Independent Distance: >186f  Trunk/Postural Assessment  Cervical Assessment Cervical Assessment: Within Functional Limits Thoracic Assessment Thoracic Assessment: Within Functional Limits Lumbar  Assessment Lumbar Assessment: Exceptions to [facility](posterior pelvic tilt) Postural Control Postural Control: Deficits on evaluation  Balance Balance Balance Assessed: Yes Static Sitting Balance Static Sitting - Balance Support: Feet supported;Bilateral upper extremity supported Static Sitting - Level of Assistance: 7: Independent Dynamic Sitting Balance Dynamic Sitting - Balance Support: Feet supported;Bilateral upper extremity supported Dynamic Sitting - Level of Assistance: 7: Independent Static Standing Balance Static Standing - Balance Support: Bilateral upper extremity supported (RW) Static Standing - Level of Assistance: 5: Stand by assistance (supervision) Dynamic Standing Balance Dynamic Standing - Balance Support: Bilateral  upper extremity supported (RW) Dynamic Standing - Level of Assistance: 5: Stand by assistance (supervision) Extremity Assessment  RLE Assessment RLE Assessment: Exceptions to [facility] Strength Comments: grossly generalized to 4-/5 (except hip adduction and knee flexion 3+/5) LLE Assessment LLE Assessment: Exceptions to [facility] Strength Comments: grossly generalized to 4-/5 (except knee flexion and hip adduction 3+/5)  Alfonse Alpers PT, DPT  07/19/2020, 7:45 AM

## 2020-07-19 NOTE — Progress Notes (Signed)
Occupational Therapy Session Note  Patient Details  Name: Keith [name] MRN: [id number] Date of Birth: 1951/06/18  Today's Date: 07/19/2020 OT Individual Time: 1000-1058 OT Individual Time Calculation (min): 58 min    Short Term Goals: Week 2:  OT Short Term Goal 1 (Week 2): STG = LTGs due to remaining LOS  Skilled Therapeutic Interventions/Progress Updates:    Treatment session with focus on family education in setting of self-care tasks.  Pt received upright in recliner with wife present.  Pt agreeable to shower.  Completed stand pivot transfers recliner > w/c > tub bench in room shower with RW with supervision.  Pt completed bathing with lateral leans and sit > stand to wash buttocks with supervision after therapist covered RLE prior to shower.  Pt completed dressing at sit > stand level in at sink with supervision.  Discussed use of UE support during LB dressing whether it be at sink, counter, or RW for UE support - pt and wife in agreement.  Pt transferred back to EOB to don compression stocking, Lt ankle brace, and shoe with setup.  Wife provided close supervision for stand pivot transfers w/c > bed > recliner.  Discussed use of BSC at home either next to bed or over commode with pt and wife reporting understanding.  Pt and wife report no further questions, ready for d/c.  Therapy Documentation Precautions:  Precautions Precautions: Fall, Other (comment) Precaution Comments: R ankle fx with ORIF Required Braces or Orthoses: Splint/Cast Splint/Cast: R cast Restrictions Weight Bearing Restrictions: Yes RLE Weight Bearing: Non weight bearing Other Position/Activity Restrictions: R ankle soft cast Pain:  Pt reports pain 3/10 in Rt ankle, premedicated   Therapy/Group: Individual Therapy  Simonne Come 07/19/2020, 8:02 AM

## 2020-07-19 NOTE — Progress Notes (Signed)
[facility] PHYSICAL MEDICINE & REHABILITATION PROGRESS NOTE   Subjective/Complaints: Patient seen sitting up at the edge of his bed this morning.  Good sitting balance noted.  He states he slept well overnight.  He is looking forward to discharge tomorrow.  Today [birth year] [age].  ROS: Denies CP, SOB, N/V/D  Objective:   No results found. Recent Labs    07/18/20 0633  WBC 6.4  HGB 10.1*  HCT 32.1*  PLT 360   Recent Labs    07/19/20 0453  NA 139  K 3.3*  CL 102  CO2 26  GLUCOSE 128*  BUN 11  CREATININE 1.12  CALCIUM 9.9    Intake/Output Summary (Last 24 hours) at 07/19/2020 1238 Last data filed at 07/19/2020 0726 Gross per 24 hour  Intake 360 ml  Output 325 ml  Net 35 ml        Physical Exam: Vital Signs Blood pressure 118/76, pulse 74, temperature 98.5 F (36.9 C), resp. rate 18, height 6' (1.829 m), weight 120.1 kg, SpO2 100 %. Constitutional: No distress . Vital signs reviewed. HENT: Normocephalic.  Atraumatic. Eyes: EOMI. No discharge. Cardiovascular: No JVD.  RRR. Respiratory: Normal effort.  No stridor.  Bilateral clear to auscultation. GI: Non-distended.  BS +. Skin: Right lower extremity with cast CDI Psych: Normal mood.  Normal behavior. Musc: Left foot with equina valgus deformity Right lower extremity casted Neuro: Alert Motor: Bilateral upper extremities: 5/5 proximal distal Right lower extremity: Hip flexion, knee extension 4+-5/5, ankle limited due to cast, wiggles toes, unchanged Left lower extremity: 4+/5 proximal distal, ankle limited due to deformity, unchanged  Assessment/Plan: 1. Functional deficits secondary to right bimalleolar ankle fracture status post ORIF complicated by pulmonary embolism which require 3+ hours per day of interdisciplinary therapy in a comprehensive inpatient rehab setting.  Physiatrist is providing close team supervision and 24 hour management of active medical problems listed below.  Physiatrist and rehab  team continue to assess barriers to discharge/monitor patient progress toward functional and medical goals  Care Tool:  Bathing  Bathing activity did not occur: Refused Body parts bathed by patient: Right arm, Left arm, Chest, Abdomen, Front perineal area, Right upper leg, Left upper leg, Face, Buttocks, Left lower leg   Body parts bathed by helper: Buttocks, Right lower leg, Left lower leg Body parts n/a: Right lower leg (cast)   Bathing assist Assist Level: Set up assist     Upper Body Dressing/Undressing Upper body dressing   What is the patient wearing?: Pull over shirt    Upper body assist Assist Level: Independent with assistive device    Lower Body Dressing/Undressing Lower body dressing      What is the patient wearing?: Underwear/pull up, Pants     Lower body assist Assist for lower body dressing: Supervision/Verbal cueing     Toileting Toileting    Toileting assist Assist for toileting: Supervision/Verbal cueing Assistive Device Comment: urinal   Transfers Chair/bed transfer  Transfers assist     Chair/bed transfer assist level: Supervision/Verbal cueing Chair/bed transfer assistive device: Programmer, multimedia   Ambulation assist   Ambulation activity did not occur: Safety/medical concerns (R LE NWB status, generalized weakness, decreased balance, poor UE strength)  Assist level: Supervision/Verbal cueing Assistive device: Walker-rolling Max distance: 30ft   Walk 10 feet activity   Assist  Walk 10 feet activity did not occur: Safety/medical concerns (R LE NWB status, generalized weakness, decreased balance, poor UE strength)  Assist level: Supervision/Verbal cueing Assistive device:  Walker-rolling   Walk 50 feet activity   Assist Walk 50 feet with 2 turns activity did not occur: Safety/medical concerns (R LE NWB precautions, generalized weakness, fatigue)         Walk 150 feet activity   Assist Walk 150 feet activity  did not occur: Safety/medical concerns (R LE NWB precautions, generalized weakness, fatigue)         Walk 10 feet on uneven surface  activity   Assist Walk 10 feet on uneven surfaces activity did not occur: Safety/medical concerns (R LE NWB precautions, generalized weakness, fatigue)         Wheelchair     Assist Will patient use wheelchair at discharge?: Yes Type of Wheelchair: Manual    Wheelchair assist level: Independent Max wheelchair distance: >159ft    Wheelchair 50 feet with 2 turns activity    Assist        Assist Level: Independent   Wheelchair 150 feet activity     Assist      Assist Level: Independent   Blood pressure 118/76, pulse 74, temperature 98.5 F (36.9 C), resp. rate 18, height 6' (1.829 m), weight 120.1 kg, SpO2 100 %.  Medical Problem List and Plan: 1.  Decreased functional mobility secondary to right bimalleolar ankle fracture status post ORIF 2/95/1884 complicated by pulmonary emboli.  Nonweightbearing x6 to 8 weeks  Continue CIR 2.  Antithrombotics: -DVT/anticoagulation: Eliquis             -antiplatelet therapy: N/A 3. Pain Management/Chronic Back pain: Cymbalta 60 mg daily, oxycodone as needed.  Patient had been on oxycodone 5 to 10 mg every 6 hours as needed prior to admission as well as Flexeril and OxyContin 10 mg 3 times daily.   Oxycodone frequency increased to every 5 mg every 4 as needed  Controlled with meds on 10/12 4. Mood: Provide emotional support             -antipsychotic agents: N/A 5. Neuropsych: This patient is capable of making decisions on his own behalf. 6. Skin/Wound Care: Routine skin checks 7. Fluids/Electrolytes/Nutrition: Routine in and outs 8.  Acute blood loss anemia.    Hemoglobin 10.1 on 10/11  Continue to monitor 9.  Diabetes mellitus peripheral neuropathy.  Hemoglobin A1c 6.7.  Currently on Lantus insulin 10 units nightly.  Patient on Glucophage 250 mg daily prior to admission.     Metformin 250 daily started on 10/1  Slightly elevated on 10/12  Monitor with increased mobility  10.  Hyperlipidemia.  Zocor 11.  AKI.  Resolved.   12.  Incidental findings left adrenal mass.  Follow-up outpatient  13.  Hyponatremia  Sodium 139 on 10/12  Continue to monitor 14. Drug induced constipation  Bowel meds increased on 9/29, increased again on 9/30  Improved with meds 15. Hypokalemia  K+ 3.3 on 10/12  Discussed with pharmacy again, daily supplementation started  Additional limitation x1 day on 10/12    LOS: 15 days A FACE TO FACE EVALUATION WAS PERFORMED  Wing Schoch Lorie Phenix 07/19/2020, 12:38 PM

## 2020-07-19 NOTE — Progress Notes (Signed)
Morning labs reflected 3.3 K+ lab value. Camera operator and Pa notified. Received verbal order to administer 20 mEq potassium supplement early  Potassium administered 10/121/2021 at 6:40 am see eMAR

## 2020-07-20 LAB — GLUCOSE, CAPILLARY: Glucose-Capillary: 133 mg/dL — ABNORMAL HIGH (ref 70–99)

## 2020-07-20 NOTE — Plan of Care (Signed)
Pt discharging to home with family

## 2020-07-20 NOTE — Progress Notes (Signed)
Inpatient Rehabilitation Care Coordinator  Discharge Note  The overall goal for the admission was met for:   Discharge location: Yes-HOME WITH WIFE WHO CAN PROVIDE 24 HR CARE  Length of Stay: Yes-16 DAYS  Discharge activity level: Yes-SUPERVISION-MIN LEVEL  Home/community participation: Yes  Services provided included: MD, RD, PT, OT, RN, CM, Pharmacy, Neuropsych and SW  Financial Services: Medicare and Private Insurance: [place]  Follow-up services arranged: Outpatient: [facility] WILL CALL TO SET UP APPOINTMENTS, DME: [facility] WHEELCHAIR, ROLLING WALKER AND 3 IN 1 and Patient/Family has no preference for HH/DME agencies  Comments (or additional information): FAMILY EDUCATION COMPLETED WITH WIFE AND BOTH COMFORTABLE WITH DISCHARGE TODAY.  Patient/Family verbalized understanding of follow-up arrangements: Yes  Individual responsible for coordination of the follow-up plan: SELF [phone]  Confirmed correct DME delivered: Elease Hashimoto 07/20/2020    Elease Hashimoto

## 2020-07-20 NOTE — Progress Notes (Signed)
[facility] PHYSICAL MEDICINE & REHABILITATION PROGRESS NOTE   Subjective/Complaints: Patient seen sitting up in bed this AM.  He states he slept well overnight.  He is eager for discharge.   ROS: Denies CP, SOB, N/V/D  Objective:   No results found. Recent Labs    07/18/20 0633  WBC 6.4  HGB 10.1*  HCT 32.1*  PLT 360   Recent Labs    07/19/20 0453  NA 139  K 3.3*  CL 102  CO2 26  GLUCOSE 128*  BUN 11  CREATININE 1.12  CALCIUM 9.9    Intake/Output Summary (Last 24 hours) at 07/20/2020 1056 Last data filed at 07/20/2020 0856 Gross per 24 hour  Intake 653 ml  Output 1275 ml  Net -622 ml        Physical Exam: Vital Signs Blood pressure 113/76, pulse 79, temperature 98.5 F (36.9 C), resp. rate 16, height 6' (1.829 m), weight 120.1 kg, SpO2 99 %. Constitutional: No distress . Vital signs reviewed. HENT: Normocephalic.  Atraumatic. Eyes: EOMI. No discharge. Cardiovascular: No JVD.  RRR. Respiratory: Normal effort.  No stridor.  Bilateral clear to auscultation. GI: Non-distended.  BS +. Skin: Right lower extremity with cast CDI Psych: Normal mood.  Normal behavior. Musc: Left foot with equinavalgus deformity Right lower extremity casted Neuro: Alert Motor: Bilateral upper extremities: 5/5 proximal distal Right lower extremity: Hip flexion, knee extension 4+-5/5, ankle limited due to cast, wiggles toes, stable Left lower extremity: 4+/5 proximal distal, ankle limited due to deformity, unchanged  Assessment/Plan: 1. Functional deficits secondary to right bimalleolar ankle fracture status post ORIF complicated by pulmonary embolism which require 3+ hours per day of interdisciplinary therapy in a comprehensive inpatient rehab setting.  Physiatrist is providing close team supervision and 24 hour management of active medical problems listed below.  Physiatrist and rehab team continue to assess barriers to discharge/monitor patient progress toward functional and  medical goals  Care Tool:  Bathing  Bathing activity did not occur: Refused Body parts bathed by patient: Right arm, Left arm, Chest, Abdomen, Front perineal area, Right upper leg, Left upper leg, Face, Buttocks, Left lower leg   Body parts bathed by helper: Buttocks, Right lower leg, Left lower leg Body parts n/a: Right lower leg (cast)   Bathing assist Assist Level: Set up assist     Upper Body Dressing/Undressing Upper body dressing   What is the patient wearing?: Pull over shirt    Upper body assist Assist Level: Independent with assistive device    Lower Body Dressing/Undressing Lower body dressing      What is the patient wearing?: Underwear/pull up, Pants     Lower body assist Assist for lower body dressing: Supervision/Verbal cueing     Toileting Toileting    Toileting assist Assist for toileting: Supervision/Verbal cueing Assistive Device Comment: urinal   Transfers Chair/bed transfer  Transfers assist     Chair/bed transfer assist level: Supervision/Verbal cueing Chair/bed transfer assistive device: Programmer, multimedia   Ambulation assist   Ambulation activity did not occur: Safety/medical concerns (R LE NWB status, generalized weakness, decreased balance, poor UE strength)  Assist level: Supervision/Verbal cueing Assistive device: Walker-rolling Max distance: 21ft   Walk 10 feet activity   Assist  Walk 10 feet activity did not occur: Safety/medical concerns (R LE NWB status, generalized weakness, decreased balance, poor UE strength)  Assist level: Supervision/Verbal cueing Assistive device: Walker-rolling   Walk 50 feet activity   Assist Walk 50 feet with 2 turns  activity did not occur: Safety/medical concerns (R LE NWB precautions, generalized weakness, fatigue)         Walk 150 feet activity   Assist Walk 150 feet activity did not occur: Safety/medical concerns (R LE NWB precautions, generalized weakness,  fatigue)         Walk 10 feet on uneven surface  activity   Assist Walk 10 feet on uneven surfaces activity did not occur: Safety/medical concerns (R LE NWB precautions, generalized weakness, fatigue)         Wheelchair     Assist Will patient use wheelchair at discharge?: Yes Type of Wheelchair: Manual    Wheelchair assist level: Independent Max wheelchair distance: >176ft    Wheelchair 50 feet with 2 turns activity    Assist        Assist Level: Independent   Wheelchair 150 feet activity     Assist      Assist Level: Independent   Blood pressure 113/76, pulse 79, temperature 98.5 F (36.9 C), resp. rate 16, height 6' (1.829 m), weight 120.1 kg, SpO2 99 %.  Medical Problem List and Plan: 1.  Decreased functional mobility secondary to right bimalleolar ankle fracture status post ORIF 01/29/9531 complicated by pulmonary emboli.  Nonweightbearing x6 to 8 weeks  DC today  Will see patient for hospital follow up 1 month post-discharge 2.  Antithrombotics: -DVT/anticoagulation: Eliquis             -antiplatelet therapy: N/A 3. Pain Management/Chronic Back pain: Cymbalta 60 mg daily, oxycodone as needed.  Patient had been on oxycodone 5 to 10 mg every 6 hours as needed prior to admission as well as Flexeril and OxyContin 10 mg 3 times daily.   Oxycodone frequency increased to every 5 mg every 4 as needed  Controlled with meds on 10/13 4. Mood: Provide emotional support             -antipsychotic agents: N/A 5. Neuropsych: This patient is capable of making decisions on his own behalf. 6. Skin/Wound Care: Routine skin checks 7. Fluids/Electrolytes/Nutrition: Routine in and outs 8.  Acute blood loss anemia.    Hemoglobin 10.1 on 10/11  Continue to monitor 9.  Diabetes mellitus peripheral neuropathy.  Hemoglobin A1c 6.7.  Currently on Lantus insulin 10 units nightly.  Patient on Glucophage 250 mg daily prior to admission.    Metformin 250 daily started on  10/1  Elevated on 10/13, monitor in ambulatory setting for further adjustments  Monitor with increased mobility  10.  Hyperlipidemia.  Zocor 11.  AKI.  Resolved.   12.  Incidental findings left adrenal mass.  Follow-up outpatient  13.  Hyponatremia  Sodium 139 on 10/12  Continue to monitor 14. Drug induced constipation  Bowel meds increased on 9/29, increased again on 9/30  Improved with meds 15. Hypokalemia  K+ 3.3 on 10/12, monitor as outpatient  Discussed with pharmacy again, daily supplementation started  Additional limitation x1 day on 10/12  > 30 minutes spent in total in discharge planning between myself and PA regarding aforementioned, as well discussion regarding DME equipment, follow-up appointments, follow-up therapies, discharge medications, discharge recommendations, and preparing for cruise vacation  LOS: 16 days A FACE TO FACE EVALUATION WAS PERFORMED  Keith [name] Keith [name] 07/20/2020, 10:56 AM

## 2020-08-01 ENCOUNTER — Encounter: Payer: Self-pay | Admitting: Family Medicine

## 2020-08-01 ENCOUNTER — Ambulatory Visit (INDEPENDENT_AMBULATORY_CARE_PROVIDER_SITE_OTHER): Payer: Medicare Other | Admitting: Family Medicine

## 2020-08-01 ENCOUNTER — Other Ambulatory Visit: Payer: Self-pay

## 2020-08-01 VITALS — BP 120/70 | HR 84 | Resp 16 | Ht 72.0 in

## 2020-08-01 DIAGNOSIS — E1169 Type 2 diabetes mellitus with other specified complication: Secondary | ICD-10-CM | POA: Diagnosis not present

## 2020-08-01 DIAGNOSIS — E876 Hypokalemia: Secondary | ICD-10-CM | POA: Diagnosis not present

## 2020-08-01 DIAGNOSIS — E1140 Type 2 diabetes mellitus with diabetic neuropathy, unspecified: Secondary | ICD-10-CM

## 2020-08-01 DIAGNOSIS — I10 Essential (primary) hypertension: Secondary | ICD-10-CM | POA: Diagnosis not present

## 2020-08-01 DIAGNOSIS — E785 Hyperlipidemia, unspecified: Secondary | ICD-10-CM

## 2020-08-01 DIAGNOSIS — I2699 Other pulmonary embolism without acute cor pulmonale: Secondary | ICD-10-CM

## 2020-08-01 NOTE — Assessment & Plan Note (Signed)
Continue Simvastatin 40 mg daily. Further recommendations according to FLP result.

## 2020-08-01 NOTE — Progress Notes (Signed)
HPI: Keith [name] is a 69 y.o. male, who is here today for 4-5 months follow up.   He was last seen on 03/30/20. Since his last visit,he was hospitalized for thoracic myelopathy (05/2020) and PE . PE and DVT LLE, s/p TPA.  Admitted on 06/26/20 and discharged to inpt rehab on 07/04/20.  Right ankle fracture ,fall related with episdoe of syncope; s/p ORIF (07/01/20). CT angiogram of the chest showed extensive bilateral pulmonary emboli with saddle embolus and associated right heart strain.  Incidental findings of a 2.2 cm x 1.9 cm low-attenuation left adrenal mass.   Lower extremity Dopplers acute DVT involving the left popliteal vein left posterior tibial vein left peroneal vein.   He is on Eliquis 5 mg bid.  Past Hx of GI bleed. He has not noted gum/nose bleed,blood in stool,melena,or gross hematuria.  DM II: Dx'ed 6+ years ago. BS's "Fine." Negative for polydipsia,polyuria, or polyphagia. Currently he is on Metformin 500 mg 1/2 tab daily.  Lab Results  Component Value Date   HGBA1C 6.7 (H) 06/27/2020   HLD: He is on Simvastatin 40 mg daily. Tolerating medication well.  Lab Results  Component Value Date   CHOL 166 11/19/2018   HDL 48.40 11/19/2018   LDLCALC 102 (H) 11/19/2018   TRIG 77.0 11/19/2018   CHOLHDL 3 11/19/2018   HTN: He is on Atenolol-chlorthalidone 50-25 mg daily and Clonidine 0.1 mg daily. Negative for severe/frequent headache, visual changes, chest pain, dyspnea, palpitation, or focal weakness. LE edema stable. HypoK+: He is on KLOR 20 meq daily.  Lab Results  Component Value Date   CREATININE 1.12 07/19/2020   BUN 11 07/19/2020   NA 139 07/19/2020   K 3.5 08/01/2020   CL 102 07/19/2020   CO2 26 07/19/2020   Review of Systems  Constitutional: Positive for fatigue. Negative for activity change, appetite change and fever.  HENT: Negative for mouth sores and sore throat.   Respiratory: Negative for cough and wheezing.   Gastrointestinal:  Negative for abdominal pain, nausea and vomiting.  Genitourinary: Negative for decreased urine volume and dysuria.  Musculoskeletal: Positive for arthralgias, back pain and gait problem.  Neurological: Negative for syncope, facial asymmetry and weakness.  Psychiatric/Behavioral: Negative for confusion.  Rest of ROS, see pertinent positives sand negatives in HPI  Current Outpatient Medications on File Prior to Visit  Medication Sig Dispense Refill  . apixaban (ELIQUIS) 5 MG TABS tablet Take 1 tablet (5 mg total) by mouth 2 (two) times daily. 60 tablet 01  . atenolol-chlorthalidone (TENORETIC) 50-25 MG tablet Take 1 tablet by mouth daily. Pt needs appt for further refills. 90 tablet 2  . cloNIDine (CATAPRES) 0.1 MG tablet Take 1 tablet (0.1 mg total) by mouth daily. 90 tablet 2  . docusate sodium (COLACE) 100 MG capsule Take 1 capsule (100 mg total) by mouth 2 (two) times daily. 10 capsule 0  . DULoxetine (CYMBALTA) 30 MG capsule Take 2 capsules (60 mg total) by mouth daily. 30 capsule 3  . metFORMIN (GLUCOPHAGE) 500 MG tablet Take 0.5 tablets (250 mg total) by mouth daily with breakfast. 45 tablet 2  . Multiple Vitamin (MULITIVITAMIN WITH MINERALS) TABS Take 1 tablet by mouth daily.    Marland Kitchen oxyCODONE (OXY IR/ROXICODONE) 5 MG immediate release tablet Take 1-2 tablets (5-10 mg total) by mouth every 6 (six) hours as needed for severe pain ((score 7 to 10)). 40 tablet 0  . polyethylene glycol (MIRALAX / GLYCOLAX) 17 g packet Take 17  g by mouth 2 (two) times daily. 14 each 0  . potassium chloride SA (KLOR-CON) 20 MEQ tablet Take 1 tablet (20 mEq total) by mouth daily. 30 tablet 0  . senna-docusate (SENOKOT-S) 8.6-50 MG tablet Take 2 tablets by mouth 2 (two) times daily.    . simvastatin (ZOCOR) 40 MG tablet Take 1 tablet (40 mg total) by mouth daily. 90 tablet 2   No current facility-administered medications on file prior to visit.    Past Medical History:  Diagnosis Date  . Allergy   . Chronic  back pain   . Diabetes mellitus without complication ([place])   . Duodenal ulcer hemorrhage 08/29/2014  . ED (erectile dysfunction)   . Esophageal stricture 08/30/2014  . Glucose intolerance (impaired glucose tolerance)   . Hiatal hernia 08/30/2014  . Hypertension   . Hypokalemia 04/11/2013  . 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 Obesity   . Osteoarthritis   . Scoliosis 2016  . Spinal stenosis of lumbar region   . Urinary tract infection 04/11/2013   Allergies  Allergen Reactions  . Lisinopril Other (See Comments)    Caused a body ache    Social History   Socioeconomic History  . Marital status: Married    Spouse name: Not on file  . Number of children: Not on file  . Years of education: Not on file  . Highest education level: Not on file  Occupational History  . Not on file  Tobacco Use  . Smoking status: Former Smoker    Packs/day: 1.00    Years: 25.00    Pack years: 25.00  . Smokeless tobacco: Never Used  Vaping Use  . Vaping Use: Never used  Substance and Sexual Activity  . Alcohol use: Not Currently    Comment: Christmas/4th of July  . Drug use: No  . Sexual activity: Yes    Birth control/protection: None  Other Topics Concern  . Not on file  Social History Narrative   Married.     Social Determinants of Health   Financial Resource Strain:   . Difficulty of Paying Living Expenses: Not on file  Food Insecurity:   . Worried About Charity fundraiser in the Last Year: Not on file  . Ran Out of Food in the Last Year: Not on file  Transportation Needs:   . Lack of Transportation (Medical): Not on file  . Lack of Transportation (Non-Medical): Not on file  Physical Activity:   . Days of Exercise per Week: Not on file  . Minutes of Exercise per Session: Not on file  Stress:   . Feeling of Stress : Not on file  Social Connections:   . Frequency of Communication with Friends and Family: Not on file  . Frequency of Social Gatherings with Friends and Family: Not on  file  . Attends Religious Services: Not on file  . Active Member of Clubs or Organizations: Not on file  . Attends Archivist Meetings: Not on file  . Marital Status: Not on file   Vitals:   08/01/20 1021  BP: 120/70  Pulse: 84  Resp: 16  SpO2: 98%   Body mass index is 35.91 kg/m.  Physical Exam Vitals and nursing note reviewed.  Constitutional:      General: He is not in acute distress.    Appearance: He is well-developed.  HENT:     Head: Normocephalic and atraumatic.     Mouth/Throat:     Mouth: Mucous membranes are moist.  Eyes:     Conjunctiva/sclera: Conjunctivae normal.     Pupils: Pupils are equal, round, and reactive to light.  Cardiovascular:     Rate and Rhythm: Normal rate and regular rhythm.     Heart sounds: No murmur heard.      Comments: Left DP present. Right LE immobilized with a cast. Pulmonary:     Effort: Pulmonary effort is normal. No respiratory distress.     Breath sounds: Normal breath sounds.  Abdominal:     Palpations: Abdomen is soft. There is no hepatomegaly or mass.     Tenderness: There is no abdominal tenderness.  Lymphadenopathy:     Cervical: No cervical adenopathy.  Skin:    General: Skin is warm.     Findings: No erythema or rash.  Neurological:     Mental Status: He is alert and oriented to person, place, and time.     Cranial Nerves: No cranial nerve deficit.     Comments: He is in a wheel chair.  Psychiatric:     Comments: Well groomed, good eye contact.   ASSESSMENT AND PLAN:  Keith [name] was seen today for 4-5 months follow-up.  Orders Placed This Encounter  Procedures  . Lipid panel  . Potassium    Type 2 diabetes mellitus with diabetic neuropathy, unspecified ([place]) HgA1C at goal. No changes in current management. Regular exercise and healthy diet with avoidance of added sugar food intake is an important part of treatment and recommended. Annual eye exam and foot care recommended. F/U in 4  months   Hyperlipidemia associated with type 2 diabetes mellitus (HCC) Continue Simvastatin 40 mg daily. Further recommendations according to FLP result.  Essential hypertension BP adequately controlled. Continue Clonidine 0.1 mg daily and Atenolol-chlorthalidone 50-25 mg daily. Low salt diet.  Hypokalemia Continue KLOR 20 meq daily. Further recommendations according to K+ result.  Acute pulmonary embolism without acute cor pulmonale, unspecified pulmonary embolism type (HCC) Continue Eliquis 5 mg bid. Some side effects discussed.  Return in about 4 months (around 12/02/2020) for HTN,DM II.   Lekeisha Arenas G. [name], MD  [facility]. [place] office.   A few things to remember from today's visit:   Type 2 diabetes mellitus with diabetic neuropathy, without long-term current use of insulin ([place])  Hyperlipidemia associated with type 2 diabetes mellitus ([place]) - Plan: Lipid panel  Essential hypertension  Hypokalemia - Plan: Potassium  No changes at this time. Depending of potassium level we will decide about potassium supplementation continuation. If you need refills please call your pharmacy. Do not use My Chart to request refills or for acute issues that need immediate attention.    Please be sure medication list is accurate. If a new problem present, please set up appointment sooner than planned today.

## 2020-08-01 NOTE — Assessment & Plan Note (Addendum)
BP adequately controlled. Continue Clonidine 0.1 mg daily and Atenolol-chlorthalidone 50-25 mg daily. Low salt diet.

## 2020-08-01 NOTE — Patient Instructions (Signed)
A few things to remember from today's visit:   Type 2 diabetes mellitus with diabetic neuropathy, without long-term current use of insulin ([place])  Hyperlipidemia associated with type 2 diabetes mellitus ([place]) - Plan: Lipid panel  Essential hypertension  Hypokalemia - Plan: Potassium  No changes at this time. Depending of potassium level we will decide about potassium supplementation continuation. If you need refills please call your pharmacy. Do not use My Chart to request refills or for acute issues that need immediate attention.    Please be sure medication list is accurate. If a new problem present, please set up appointment sooner than planned today.

## 2020-08-01 NOTE — Assessment & Plan Note (Addendum)
HgA1C at goal. No changes in current management. Regular exercise and healthy diet with avoidance of added sugar food intake is an important part of treatment and recommended. Annual eye exam and foot care recommended. F/U in 4 months

## 2020-08-02 LAB — LIPID PANEL
Cholesterol: 145 mg/dL (ref <200)
HDL: 44 mg/dL (ref >=40)
LDL Cholesterol (Calc): 82 mg/dL (calc)
Non-HDL Cholesterol (Calc): 101 mg/dL (calc) (ref <130)
Total CHOL/HDL Ratio: 3.3 (calc) (ref <5.0)
Triglycerides: 92 mg/dL (ref <150)

## 2020-08-02 LAB — POTASSIUM: Potassium: 3.5 mmol/L (ref 3.5–5.3)

## 2020-08-06 ENCOUNTER — Encounter: Payer: Self-pay | Admitting: Family Medicine

## 2020-08-23 ENCOUNTER — Encounter: Payer: Medicare Other | Attending: Physical Medicine & Rehabilitation | Admitting: Physical Medicine & Rehabilitation

## 2020-09-24 ENCOUNTER — Ambulatory Visit: Payer: Medicare Other | Attending: Internal Medicine

## 2020-09-24 DIAGNOSIS — Z23 Encounter for immunization: Secondary | ICD-10-CM

## 2020-09-24 NOTE — Progress Notes (Signed)
.     Covid-19 Vaccination Clinic  Name:  Keith [name]    MRN: [id number] DOB: 05-Apr-1951  09/24/2020  [name] was observed post Covid-19 immunization for 15 minutes without incident. He was provided with Vaccine Information Sheet and instruction to access the V-Safe system.   [name] was instructed to call 911 with any severe reactions post vaccine: Marland Kitchen Difficulty breathing  . Swelling of face and throat  . A fast heartbeat  . A bad rash all over body  . Dizziness and weakness   Immunizations Administered    Name Date Dose VIS Date Route   Pfizer COVID-19 Vaccine 09/24/2020  9:12 AM 0.3 mL 07/27/2020 Intramuscular   Manufacturer: [place]   Lot: [id number]   [place]: [id number]

## 2020-10-14 DIAGNOSIS — I2699 Other pulmonary embolism without acute cor pulmonale: Secondary | ICD-10-CM | POA: Diagnosis not present

## 2020-10-14 DIAGNOSIS — R5381 Other malaise: Secondary | ICD-10-CM | POA: Diagnosis not present

## 2020-10-14 DIAGNOSIS — S82851A Displaced trimalleolar fracture of right lower leg, initial encounter for closed fracture: Secondary | ICD-10-CM | POA: Diagnosis not present

## 2020-11-02 DIAGNOSIS — S82851D Displaced trimalleolar fracture of right lower leg, subsequent encounter for closed fracture with routine healing: Secondary | ICD-10-CM | POA: Diagnosis not present

## 2020-11-14 DIAGNOSIS — S82851A Displaced trimalleolar fracture of right lower leg, initial encounter for closed fracture: Secondary | ICD-10-CM | POA: Diagnosis not present

## 2020-11-14 DIAGNOSIS — I2699 Other pulmonary embolism without acute cor pulmonale: Secondary | ICD-10-CM | POA: Diagnosis not present

## 2020-11-14 DIAGNOSIS — R5381 Other malaise: Secondary | ICD-10-CM | POA: Diagnosis not present

## 2020-11-15 DIAGNOSIS — I10 Essential (primary) hypertension: Secondary | ICD-10-CM | POA: Diagnosis not present

## 2020-11-15 DIAGNOSIS — M48062 Spinal stenosis, lumbar region with neurogenic claudication: Secondary | ICD-10-CM | POA: Diagnosis not present

## 2020-11-15 DIAGNOSIS — M961 Postlaminectomy syndrome, not elsewhere classified: Secondary | ICD-10-CM | POA: Diagnosis not present

## 2020-11-15 DIAGNOSIS — M4714 Other spondylosis with myelopathy, thoracic region: Secondary | ICD-10-CM | POA: Diagnosis not present

## 2020-11-15 DIAGNOSIS — Z79899 Other long term (current) drug therapy: Secondary | ICD-10-CM | POA: Diagnosis not present

## 2020-11-15 DIAGNOSIS — M47816 Spondylosis without myelopathy or radiculopathy, lumbar region: Secondary | ICD-10-CM | POA: Diagnosis not present

## 2020-11-15 DIAGNOSIS — M5416 Radiculopathy, lumbar region: Secondary | ICD-10-CM | POA: Diagnosis not present

## 2020-11-19 ENCOUNTER — Inpatient Hospital Stay (HOSPITAL_COMMUNITY): Payer: Medicare Other

## 2020-11-19 ENCOUNTER — Emergency Department (HOSPITAL_COMMUNITY): Payer: Medicare Other

## 2020-11-19 ENCOUNTER — Inpatient Hospital Stay (HOSPITAL_COMMUNITY)
Admission: EM | Admit: 2020-11-19 | Discharge: 2020-11-24 | DRG: 193 | Disposition: A | Discharge status: Home Health | Payer: Medicare Other | Attending: Internal Medicine | Admitting: Internal Medicine

## 2020-11-19 ENCOUNTER — Encounter (HOSPITAL_COMMUNITY): Payer: Self-pay | Admitting: Internal Medicine

## 2020-11-19 DIAGNOSIS — Z9689 Presence of other specified functional implants: Secondary | ICD-10-CM | POA: Diagnosis not present

## 2020-11-19 DIAGNOSIS — Z20822 Contact with and (suspected) exposure to covid-19: Secondary | ICD-10-CM | POA: Diagnosis present

## 2020-11-19 DIAGNOSIS — Z8711 Personal history of peptic ulcer disease: Secondary | ICD-10-CM

## 2020-11-19 DIAGNOSIS — W1830XA Fall on same level, unspecified, initial encounter: Secondary | ICD-10-CM | POA: Diagnosis present

## 2020-11-19 DIAGNOSIS — J942 Hemothorax: Secondary | ICD-10-CM | POA: Diagnosis not present

## 2020-11-19 DIAGNOSIS — J918 Pleural effusion in other conditions classified elsewhere: Secondary | ICD-10-CM | POA: Diagnosis present

## 2020-11-19 DIAGNOSIS — I700 Atherosclerosis of aorta: Secondary | ICD-10-CM | POA: Diagnosis present

## 2020-11-19 DIAGNOSIS — Z23 Encounter for immunization: Secondary | ICD-10-CM

## 2020-11-19 DIAGNOSIS — E876 Hypokalemia: Secondary | ICD-10-CM | POA: Diagnosis present

## 2020-11-19 DIAGNOSIS — Z66 Do not resuscitate: Secondary | ICD-10-CM

## 2020-11-19 DIAGNOSIS — N183 Chronic kidney disease, stage 3 unspecified: Secondary | ICD-10-CM | POA: Diagnosis present

## 2020-11-19 DIAGNOSIS — J13 Pneumonia due to Streptococcus pneumoniae: Secondary | ICD-10-CM | POA: Diagnosis present

## 2020-11-19 DIAGNOSIS — J181 Lobar pneumonia, unspecified organism: Secondary | ICD-10-CM | POA: Diagnosis not present

## 2020-11-19 DIAGNOSIS — I2782 Chronic pulmonary embolism: Secondary | ICD-10-CM | POA: Diagnosis not present

## 2020-11-19 DIAGNOSIS — N179 Acute kidney failure, unspecified: Secondary | ICD-10-CM | POA: Diagnosis present

## 2020-11-19 DIAGNOSIS — Z978 Presence of other specified devices: Secondary | ICD-10-CM | POA: Diagnosis not present

## 2020-11-19 DIAGNOSIS — J948 Other specified pleural conditions: Secondary | ICD-10-CM | POA: Diagnosis not present

## 2020-11-19 DIAGNOSIS — G894 Chronic pain syndrome: Secondary | ICD-10-CM | POA: Diagnosis present

## 2020-11-19 DIAGNOSIS — E1169 Type 2 diabetes mellitus with other specified complication: Secondary | ICD-10-CM | POA: Diagnosis not present

## 2020-11-19 DIAGNOSIS — E1140 Type 2 diabetes mellitus with diabetic neuropathy, unspecified: Secondary | ICD-10-CM | POA: Diagnosis not present

## 2020-11-19 DIAGNOSIS — J9601 Acute respiratory failure with hypoxia: Secondary | ICD-10-CM | POA: Diagnosis present

## 2020-11-19 DIAGNOSIS — K5900 Constipation, unspecified: Secondary | ICD-10-CM | POA: Diagnosis not present

## 2020-11-19 DIAGNOSIS — R042 Hemoptysis: Secondary | ICD-10-CM | POA: Diagnosis not present

## 2020-11-19 DIAGNOSIS — E119 Type 2 diabetes mellitus without complications: Secondary | ICD-10-CM | POA: Diagnosis not present

## 2020-11-19 DIAGNOSIS — S20219A Contusion of unspecified front wall of thorax, initial encounter: Secondary | ICD-10-CM | POA: Diagnosis present

## 2020-11-19 DIAGNOSIS — E11649 Type 2 diabetes mellitus with hypoglycemia without coma: Secondary | ICD-10-CM | POA: Diagnosis present

## 2020-11-19 DIAGNOSIS — Z8249 Family history of ischemic heart disease and other diseases of the circulatory system: Secondary | ICD-10-CM

## 2020-11-19 DIAGNOSIS — D3502 Benign neoplasm of left adrenal gland: Secondary | ICD-10-CM | POA: Diagnosis present

## 2020-11-19 DIAGNOSIS — J869 Pyothorax without fistula: Secondary | ICD-10-CM | POA: Diagnosis not present

## 2020-11-19 DIAGNOSIS — E6609 Other obesity due to excess calories: Secondary | ICD-10-CM | POA: Diagnosis present

## 2020-11-19 DIAGNOSIS — J189 Pneumonia, unspecified organism: Secondary | ICD-10-CM | POA: Diagnosis not present

## 2020-11-19 DIAGNOSIS — E785 Hyperlipidemia, unspecified: Secondary | ICD-10-CM | POA: Diagnosis present

## 2020-11-19 DIAGNOSIS — Z833 Family history of diabetes mellitus: Secondary | ICD-10-CM

## 2020-11-19 DIAGNOSIS — I2510 Atherosclerotic heart disease of native coronary artery without angina pectoris: Secondary | ICD-10-CM | POA: Diagnosis not present

## 2020-11-19 DIAGNOSIS — R0789 Other chest pain: Secondary | ICD-10-CM | POA: Diagnosis not present

## 2020-11-19 DIAGNOSIS — Z79899 Other long term (current) drug therapy: Secondary | ICD-10-CM

## 2020-11-19 DIAGNOSIS — Z888 Allergy status to other drugs, medicaments and biological substances status: Secondary | ICD-10-CM

## 2020-11-19 DIAGNOSIS — R911 Solitary pulmonary nodule: Secondary | ICD-10-CM | POA: Diagnosis present

## 2020-11-19 DIAGNOSIS — Z886 Allergy status to analgesic agent status: Secondary | ICD-10-CM

## 2020-11-19 DIAGNOSIS — F32A Depression, unspecified: Secondary | ICD-10-CM | POA: Diagnosis present

## 2020-11-19 DIAGNOSIS — R079 Chest pain, unspecified: Secondary | ICD-10-CM | POA: Diagnosis not present

## 2020-11-19 DIAGNOSIS — Z6836 Body mass index (BMI) 36.0-36.9, adult: Secondary | ICD-10-CM

## 2020-11-19 DIAGNOSIS — R5381 Other malaise: Secondary | ICD-10-CM | POA: Diagnosis present

## 2020-11-19 DIAGNOSIS — M549 Dorsalgia, unspecified: Secondary | ICD-10-CM | POA: Diagnosis present

## 2020-11-19 DIAGNOSIS — Z7901 Long term (current) use of anticoagulants: Secondary | ICD-10-CM

## 2020-11-19 DIAGNOSIS — R918 Other nonspecific abnormal finding of lung field: Secondary | ICD-10-CM | POA: Diagnosis not present

## 2020-11-19 DIAGNOSIS — J90 Pleural effusion, not elsewhere classified: Secondary | ICD-10-CM

## 2020-11-19 DIAGNOSIS — Z7984 Long term (current) use of oral hypoglycemic drugs: Secondary | ICD-10-CM

## 2020-11-19 DIAGNOSIS — I10 Essential (primary) hypertension: Secondary | ICD-10-CM | POA: Diagnosis not present

## 2020-11-19 DIAGNOSIS — Z87891 Personal history of nicotine dependence: Secondary | ICD-10-CM

## 2020-11-19 HISTORY — DX: Do not resuscitate: Z66

## 2020-11-19 LAB — CBC WITH DIFFERENTIAL/PLATELET
Abs Immature Granulocytes: 0.03 10*3/uL (ref 0.00–0.07)
Basophils Absolute: 0 10*3/uL (ref 0.0–0.1)
Basophils Relative: 0 %
Eosinophils Absolute: 0.1 10*3/uL (ref 0.0–0.5)
Eosinophils Relative: 1 %
HCT: 39 % (ref 39.0–52.0)
Hemoglobin: 12.4 g/dL — ABNORMAL LOW (ref 13.0–17.0)
Immature Granulocytes: 0 %
Lymphocytes Relative: 6 %
Lymphs Abs: 0.6 10*3/uL — ABNORMAL LOW (ref 0.7–4.0)
MCH: 28.7 pg (ref 26.0–34.0)
MCHC: 31.8 g/dL (ref 30.0–36.0)
MCV: 90.3 fL (ref 80.0–100.0)
Monocytes Absolute: 1.3 10*3/uL — ABNORMAL HIGH (ref 0.1–1.0)
Monocytes Relative: 13 %
Neutro Abs: 8.4 10*3/uL — ABNORMAL HIGH (ref 1.7–7.7)
Neutrophils Relative %: 80 %
Platelets: 446 10*3/uL — ABNORMAL HIGH (ref 150–400)
RBC: 4.32 MIL/uL (ref 4.22–5.81)
RDW: 15.7 % — ABNORMAL HIGH (ref 11.5–15.5)
WBC: 10.4 10*3/uL (ref 4.0–10.5)
nRBC: 0 % (ref 0.0–0.2)

## 2020-11-19 LAB — CBG MONITORING, ED
Glucose-Capillary: 153 mg/dL — ABNORMAL HIGH (ref 70–99)
Glucose-Capillary: 138 mg/dL — ABNORMAL HIGH (ref 70–99)
Glucose-Capillary: 150 mg/dL — ABNORMAL HIGH (ref 70–99)

## 2020-11-19 LAB — RESP PANEL BY RT-PCR (FLU A&B, COVID) ARPGX2
Influenza A by PCR: NEGATIVE
Influenza B by PCR: NEGATIVE
SARS Coronavirus 2 by RT PCR: NEGATIVE

## 2020-11-19 LAB — I-STAT CHEM 8, ED
BUN: 31 mg/dL — ABNORMAL HIGH (ref 8–23)
Calcium, Ion: 1.27 mmol/L (ref 1.15–1.40)
Chloride: 101 mmol/L (ref 98–111)
Creatinine, Ser: 1.4 mg/dL — ABNORMAL HIGH (ref 0.61–1.24)
Glucose, Bld: 156 mg/dL — ABNORMAL HIGH (ref 70–99)
HCT: 40 % (ref 39.0–52.0)
Hemoglobin: 13.6 g/dL (ref 13.0–17.0)
Potassium: 3.3 mmol/L — ABNORMAL LOW (ref 3.5–5.1)
Sodium: 140 mmol/L (ref 135–145)
TCO2: 27 mmol/L (ref 22–32)

## 2020-11-19 LAB — HIV ANTIBODY (ROUTINE TESTING W REFLEX): HIV Screen 4th Generation wRfx: NONREACTIVE

## 2020-11-19 LAB — EXPECTORATED SPUTUM ASSESSMENT W GRAM STAIN, RFLX TO RESP C: Special Requests: NORMAL

## 2020-11-19 LAB — BASIC METABOLIC PANEL
Anion gap: 11 (ref 5–15)
BUN: 26 mg/dL — ABNORMAL HIGH (ref 8–23)
CO2: 26 mmol/L (ref 22–32)
Calcium: 9.9 mg/dL (ref 8.9–10.3)
Chloride: 99 mmol/L (ref 98–111)
Creatinine, Ser: 1.48 mg/dL — ABNORMAL HIGH (ref 0.61–1.24)
GFR, Estimated: 51 mL/min — ABNORMAL LOW (ref >60)
Glucose, Bld: 158 mg/dL — ABNORMAL HIGH (ref 70–99)
Potassium: 3.3 mmol/L — ABNORMAL LOW (ref 3.5–5.1)
Sodium: 136 mmol/L (ref 135–145)

## 2020-11-19 LAB — STREP PNEUMONIAE URINARY ANTIGEN: Strep Pneumo Urinary Antigen: POSITIVE — AB

## 2020-11-19 LAB — PROCALCITONIN: Procalcitonin: 0.14 ng/mL

## 2020-11-19 LAB — TROPONIN I (HIGH SENSITIVITY): Troponin I (High Sensitivity): 13 ng/L (ref <18)

## 2020-11-19 MED ORDER — PIPERACILLIN-TAZOBACTAM 3.375 G IVPB 30 MIN
3.3750 g | Freq: Once | INTRAVENOUS | Status: AC
  Administered 2020-11-19: 3.375 g via INTRAVENOUS
  Filled 2020-11-19: qty 50

## 2020-11-19 MED ORDER — FENTANYL CITRATE (PF) 100 MCG/2ML IJ SOLN
INTRAMUSCULAR | Status: AC
  Filled 2020-11-19: qty 2

## 2020-11-19 MED ORDER — SODIUM CHLORIDE 0.9 % IV SOLN
500.0000 mg | INTRAVENOUS | Status: DC
  Administered 2020-11-19 – 2020-11-23 (×5): 500 mg via INTRAVENOUS
  Filled 2020-11-19: qty 500
  Filled 2020-11-19 (×2): qty 25
  Filled 2020-11-19: qty 500
  Filled 2020-11-19: qty 25
  Filled 2020-11-19: qty 500

## 2020-11-19 MED ORDER — DIPHENHYDRAMINE HCL 50 MG/ML IJ SOLN
25.0000 mg | Freq: Once | INTRAMUSCULAR | Status: AC
  Administered 2020-11-19: 25 mg via INTRAVENOUS
  Filled 2020-11-19: qty 1

## 2020-11-19 MED ORDER — INSULIN ASPART 100 UNIT/ML ~~LOC~~ SOLN: 0.0000 [IU] | Freq: Every day | SUBCUTANEOUS | Status: DC

## 2020-11-19 MED ORDER — SODIUM CHLORIDE 0.9 % IV SOLN: INTRAVENOUS | Status: DC

## 2020-11-19 MED ORDER — GUAIFENESIN-CODEINE 100-10 MG/5ML PO SOLN: 10.0000 mL | ORAL | Status: DC | PRN

## 2020-11-19 MED ORDER — ATENOLOL 25 MG PO TABS
50.0000 mg | ORAL_TABLET | Freq: Every day | ORAL | Status: DC
  Administered 2020-11-20 – 2020-11-24 (×5): 50 mg via ORAL
  Filled 2020-11-19 (×5): qty 2

## 2020-11-19 MED ORDER — LIDOCAINE 5 % EX PTCH
1.0000 | MEDICATED_PATCH | CUTANEOUS | Status: DC
  Administered 2020-11-19 – 2020-11-24 (×6): 1 via TRANSDERMAL
  Filled 2020-11-19 (×7): qty 1

## 2020-11-19 MED ORDER — SODIUM CHLORIDE (PF) 0.9 % IJ SOLN
10.0000 mg | Freq: Every day | INTRAMUSCULAR | Status: AC
  Administered 2020-11-19 – 2020-11-21 (×3): 10 mg via INTRAPLEURAL
  Filled 2020-11-19 (×3): qty 10

## 2020-11-19 MED ORDER — OXYCODONE HCL 5 MG PO TABS
5.0000 mg | ORAL_TABLET | Freq: Four times a day (QID) | ORAL | Status: DC | PRN
Start: 2020-11-19 — End: 2020-11-19

## 2020-11-19 MED ORDER — SIMVASTATIN 20 MG PO TABS
40.0000 mg | ORAL_TABLET | Freq: Every day | ORAL | Status: DC
  Administered 2020-11-19 – 2020-11-24 (×6): 40 mg via ORAL
  Filled 2020-11-19 (×6): qty 2

## 2020-11-19 MED ORDER — MORPHINE SULFATE (PF) 4 MG/ML IV SOLN
4.0000 mg | Freq: Once | INTRAVENOUS | Status: AC
  Administered 2020-11-19: 4 mg via INTRAVENOUS
  Filled 2020-11-19: qty 1

## 2020-11-19 MED ORDER — IOHEXOL 350 MG/ML SOLN
100.0000 mL | Freq: Once | INTRAVENOUS | Status: AC | PRN
  Administered 2020-11-19: 100 mL via INTRAVENOUS

## 2020-11-19 MED ORDER — SODIUM CHLORIDE 0.9 % IV SOLN
2.0000 g | INTRAVENOUS | Status: DC
  Administered 2020-11-19 – 2020-11-23 (×5): 2 g via INTRAVENOUS
  Filled 2020-11-19 (×2): qty 2
  Filled 2020-11-19: qty 20
  Filled 2020-11-19 (×2): qty 2
  Filled 2020-11-19: qty 20

## 2020-11-19 MED ORDER — ATENOLOL-CHLORTHALIDONE 50-25 MG PO TABS: 1.0000 | ORAL_TABLET | Freq: Every day | ORAL | Status: DC

## 2020-11-19 MED ORDER — ALUM & MAG HYDROXIDE-SIMETH 200-200-20 MG/5ML PO SUSP
15.0000 mL | Freq: Three times a day (TID) | ORAL | Status: DC | PRN
  Administered 2020-11-22: 30 mL via ORAL
  Filled 2020-11-19: qty 30

## 2020-11-19 MED ORDER — ACETAMINOPHEN 325 MG PO TABS: 650.0000 mg | ORAL_TABLET | Freq: Four times a day (QID) | ORAL | Status: DC | PRN

## 2020-11-19 MED ORDER — POLYETHYLENE GLYCOL 3350 17 G PO PACK: 17.0000 g | PACK | Freq: Two times a day (BID) | ORAL | Status: DC

## 2020-11-19 MED ORDER — ONDANSETRON HCL 4 MG/2ML IJ SOLN
4.0000 mg | Freq: Once | INTRAMUSCULAR | Status: AC
  Administered 2020-11-19: 4 mg via INTRAVENOUS
  Filled 2020-11-19: qty 2

## 2020-11-19 MED ORDER — STERILE WATER FOR INJECTION IJ SOLN
5.0000 mg | Freq: Every day | RESPIRATORY_TRACT | Status: AC
  Administered 2020-11-19 – 2020-11-21 (×3): 5 mg via INTRAPLEURAL
  Filled 2020-11-19 (×4): qty 5

## 2020-11-19 MED ORDER — INSULIN ASPART 100 UNIT/ML ~~LOC~~ SOLN
0.0000 [IU] | Freq: Three times a day (TID) | SUBCUTANEOUS | Status: DC
  Administered 2020-11-19: 2 [IU] via SUBCUTANEOUS
  Administered 2020-11-19: 3 [IU] via SUBCUTANEOUS
  Administered 2020-11-20 – 2020-11-21 (×4): 2 [IU] via SUBCUTANEOUS

## 2020-11-19 MED ORDER — DULOXETINE HCL 60 MG PO CPEP
60.0000 mg | ORAL_CAPSULE | Freq: Every day | ORAL | Status: DC
  Administered 2020-11-19 – 2020-11-24 (×6): 60 mg via ORAL
  Filled 2020-11-19 (×6): qty 1

## 2020-11-19 MED ORDER — SENNOSIDES-DOCUSATE SODIUM 8.6-50 MG PO TABS
2.0000 | ORAL_TABLET | Freq: Two times a day (BID) | ORAL | Status: DC
  Administered 2020-11-19 – 2020-11-22 (×6): 2 via ORAL
  Filled 2020-11-19 (×6): qty 2

## 2020-11-19 MED ORDER — ADULT MULTIVITAMIN W/MINERALS CH
1.0000 | ORAL_TABLET | Freq: Every day | ORAL | Status: DC
  Administered 2020-11-19 – 2020-11-24 (×6): 1 via ORAL
  Filled 2020-11-19 (×6): qty 1

## 2020-11-19 MED ORDER — CLONIDINE HCL 0.1 MG PO TABS
0.1000 mg | ORAL_TABLET | Freq: Every day | ORAL | Status: DC
  Administered 2020-11-19 – 2020-11-24 (×6): 0.1 mg via ORAL
  Filled 2020-11-19 (×6): qty 1

## 2020-11-19 MED ORDER — MIDAZOLAM HCL 2 MG/2ML IJ SOLN
INTRAMUSCULAR | Status: AC
  Filled 2020-11-19: qty 2

## 2020-11-19 MED ORDER — VANCOMYCIN HCL 2000 MG/400ML IV SOLN
2000.0000 mg | Freq: Once | INTRAVENOUS | Status: AC
  Administered 2020-11-19: 2000 mg via INTRAVENOUS
  Filled 2020-11-19: qty 400

## 2020-11-19 MED ORDER — MIDAZOLAM HCL 2 MG/2ML IJ SOLN
INTRAMUSCULAR | Status: AC | PRN
  Administered 2020-11-19: 1 mg via INTRAVENOUS

## 2020-11-19 MED ORDER — CHLORTHALIDONE 25 MG PO TABS
25.0000 mg | ORAL_TABLET | Freq: Every day | ORAL | Status: DC
Start: 2020-11-20 — End: 2020-11-24
  Administered 2020-11-20 – 2020-11-24 (×5): 25 mg via ORAL
  Filled 2020-11-19 (×5): qty 1

## 2020-11-19 MED ORDER — HYDROMORPHONE HCL 1 MG/ML IJ SOLN
1.0000 mg | INTRAMUSCULAR | Status: DC | PRN
  Administered 2020-11-19 – 2020-11-21 (×4): 1 mg via INTRAVENOUS
  Filled 2020-11-19 (×4): qty 1

## 2020-11-19 MED ORDER — POTASSIUM CHLORIDE CRYS ER 20 MEQ PO TBCR
20.0000 meq | EXTENDED_RELEASE_TABLET | Freq: Every day | ORAL | Status: DC
  Administered 2020-11-19 – 2020-11-23 (×5): 20 meq via ORAL
  Filled 2020-11-19 (×6): qty 1

## 2020-11-19 MED ORDER — MORPHINE SULFATE (PF) 2 MG/ML IV SOLN
2.0000 mg | INTRAVENOUS | Status: DC | PRN
  Administered 2020-11-19 – 2020-11-20 (×3): 2 mg via INTRAVENOUS
  Filled 2020-11-19 (×3): qty 1

## 2020-11-19 MED ORDER — DOCUSATE SODIUM 100 MG PO CAPS: 100.0000 mg | ORAL_CAPSULE | Freq: Two times a day (BID) | ORAL | Status: DC

## 2020-11-19 MED ORDER — FENTANYL CITRATE (PF) 100 MCG/2ML IJ SOLN
INTRAMUSCULAR | Status: AC | PRN
  Administered 2020-11-19: 50 ug via INTRAVENOUS

## 2020-11-19 MED ORDER — HYDROMORPHONE HCL 1 MG/ML IJ SOLN: 1.0000 mg | Freq: Every day | INTRAMUSCULAR | Status: DC | PRN

## 2020-11-19 MED ORDER — LIDOCAINE HCL 1 % IJ SOLN
INTRAMUSCULAR | Status: AC
  Filled 2020-11-19: qty 20

## 2020-11-19 MED ORDER — ONDANSETRON HCL 4 MG/2ML IJ SOLN
4.0000 mg | Freq: Four times a day (QID) | INTRAMUSCULAR | Status: DC | PRN
  Administered 2020-11-19: 4 mg via INTRAVENOUS
  Filled 2020-11-19: qty 2

## 2020-11-19 MED ORDER — OXYCODONE HCL 5 MG PO TABS
7.5000 mg | ORAL_TABLET | Freq: Four times a day (QID) | ORAL | Status: DC | PRN
  Administered 2020-11-19 – 2020-11-24 (×13): 10 mg via ORAL
  Filled 2020-11-19 (×13): qty 2

## 2020-11-19 NOTE — ED Notes (Signed)
A/O x 4, chest tube in place to posterior left side, drsg c/d/i, sx at 20 cm, 120 cc serous yellow drainage in container, no distress, RR of 25

## 2020-11-19 NOTE — ED Notes (Signed)
CT serous fluid total at 250cc when stop cock turned back to allow for drainage

## 2020-11-19 NOTE — Procedures (Signed)
Interventional Radiology Procedure Note  Procedure: CT GUIDED 10FR LEFT CHEST TUBE    Complications: None  Estimated Blood Loss:  0  Findings: SEROUS PLEURAL FLD ASPIRATED AND SENT FOR CX    Tamera Punt, MD

## 2020-11-19 NOTE — H&P (Addendum)
History and Physical    Keith [name] GDJ:[id number] DOB: 06/03/1951 DOA: 11/19/2020  PCP: [name], [name], MD Consultants:  Zada Finders - neurosurgery; Fuller Plan - GI; Bartko - pain management Patient coming from:  Home - lives with wife; NOK: Wife, [phone]  Chief Complaint: Pleuritic chest pain  HPI: Keith [name] is a 70 y.o. male with medical history significant of HTN; DM; chronic back pain; and PE in 06/2020 presenting with cough, chest pain, hemoptysis, and fall.  It started 3 days agfo - he went out without a coat and got sweaty and came back in with the start of a cold.  About 6 hours later, he started coughing up blood.  He felt a bit better.  Yesterday, he ran errands and stepped in mud and fell back and hit his back on the car door.  Last night, about 11pm he thought he was having a heart attack - couldn't move, breathe, or lift his arm.  EMS came and his vitals were pretty normal.  However, he was having sharp pleuritic chest pain and they brought him to the ER.  He is no longer coughing up blood but it did recur last night.  He is coughing up regular phlegm.  +fever to 101.4 on Tuesday, otherwise none.  No sick contacts.  He was hospitalized from 9/19-27 for acute saddle PE with cor pulmonale following prior admission for back pain.  While hospitalized he had a mechanical fall and required ORIF for ankle fracture.  He was discharged to CIR.  He was treated with Eliquis but reports that this was discontinued after 90 days.    ED Course:  Carryover, per Dr. Tonie Griffith:  70 yo male with hx of PE. Presents after coughing up some blood. Had fall yesterday and landed on left side. Presented with left chest pain and coughing up blood. Has had worsening cough for past few days. Found to have PNA and loculated pleural effusion on CT scan. Has chronic small PE  Needs CT surgery consult.   Review of Systems: As per HPI; otherwise review of systems reviewed and negative.   Ambulatory  Status:  Ambulates with a walker or uses a wheelchair  COVID Vaccine Status:  Complete plus booster  Past Medical History:  Diagnosis Date  . Allergy   . Chronic back pain   . Diabetes mellitus without complication ([place])   . Duodenal ulcer hemorrhage 08/29/2014  . ED (erectile dysfunction)   . Esophageal stricture 08/30/2014  . Glucose intolerance (impaired glucose tolerance)   . Hiatal hernia 08/30/2014  . Hypertension   . Hypokalemia 04/11/2013  . 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 Obesity   . Osteoarthritis   . Scoliosis 2016  . Spinal stenosis of lumbar region   . Urinary tract infection 04/11/2013    Past Surgical History:  Procedure Laterality Date  . COLONOSCOPY  2008  . ESOPHAGOGASTRODUODENOSCOPY N/A 08/29/2014   Procedure: ESOPHAGOGASTRODUODENOSCOPY (EGD);  Surgeon: Lafayette Dragon, MD;  Location: [facility];  Service: Endoscopy;  Laterality: N/A;  . LAMINECTOMY N/A 07/28/2018   Procedure: THORACIC ELEVEN- THORACIC TWELVE POSTERIOR DECOMPRESSION LAMINECTOMY;  Surgeon: Judith Part, MD;  Location: [facility];  Service: Neurosurgery;  Laterality: N/A;  . LUMBAR LAMINECTOMY    . NASAL POLYP SURGERY    . ORIF ANKLE FRACTURE Right 07/01/2020   Procedure: OPEN REDUCTION INTERNAL FIXATION (ORIF) ANKLE FRACTURE. TRIMALLEOLAR;  Surgeon: Marchia Bond, MD;  Location: [facility];  Service: Orthopedics;  Laterality: Right;  . TONSILLECTOMY  Social History   Socioeconomic History  . Marital status: Married    Spouse name: Not on file  . Number of children: Not on file  . Years of education: Not on file  . Highest education level: Not on file  Occupational History  . Not on file  Tobacco Use  . Smoking status: Former Smoker    Packs/day: 1.00    Years: 25.00    Pack years: 25.00  . Smokeless tobacco: Never Used  Vaping Use  . Vaping Use: Never used  Substance and Sexual Activity  . Alcohol use: Not Currently    Comment: Christmas/4th of July  . Drug use: No  . Sexual  activity: Yes    Birth control/protection: None  Other Topics Concern  . Not on file  Social History Narrative   Married.     Social Determinants of Health   Financial Resource Strain: Not on file  Food Insecurity: Not on file  Transportation Needs: Not on file  Physical Activity: Not on file  Stress: Not on file  Social Connections: Not on file  Intimate Partner Violence: Not on file    Allergies  Allergen Reactions  . Lisinopril Other (See Comments)    Caused a body ache    Family History  Problem Relation Age of Onset  . Diabetes Mother   . Asthma Mother   . Hypertension Father   . Stroke Father   . Colon cancer Neg Hx     Prior to Admission medications   Medication Sig Start Date End Date Taking? Authorizing Provider  apixaban (ELIQUIS) 5 MG TABS tablet Take 1 tablet (5 mg total) by mouth 2 (two) times daily. 07/19/20   Angiulli, Lavon Paganini, PA-C  atenolol-chlorthalidone (TENORETIC) 50-25 MG tablet Take 1 tablet by mouth daily. Pt needs appt for further refills. 07/19/20   Angiulli, Lavon Paganini, PA-C  cloNIDine (CATAPRES) 0.1 MG tablet Take 1 tablet (0.1 mg total) by mouth daily. 07/19/20   Angiulli, Lavon Paganini, PA-C  docusate sodium (COLACE) 100 MG capsule Take 1 capsule (100 mg total) by mouth 2 (two) times daily. 06/04/20   Viona Gilmore D, NP  DULoxetine (CYMBALTA) 30 MG capsule Take 2 capsules (60 mg total) by mouth daily. 07/19/20   Angiulli, Lavon Paganini, PA-C  metFORMIN (GLUCOPHAGE) 500 MG tablet Take 0.5 tablets (250 mg total) by mouth daily with breakfast. 07/19/20   Angiulli, Lavon Paganini, PA-C  Multiple Vitamin (MULITIVITAMIN WITH MINERALS) TABS Take 1 tablet by mouth daily.    [provider]  oxyCODONE (OXY IR/ROXICODONE) 5 MG immediate release tablet Take 1-2 tablets (5-10 mg total) by mouth every 6 (six) hours as needed for severe pain ((score 7 to 10)). 07/19/20   Angiulli, Lavon Paganini, PA-C  polyethylene glycol (MIRALAX / GLYCOLAX) 17 g packet Take 17 g by  mouth 2 (two) times daily. 07/19/20   Angiulli, Lavon Paganini, PA-C  potassium chloride SA (KLOR-CON) 20 MEQ tablet Take 1 tablet (20 mEq total) by mouth daily. 07/19/20   Angiulli, Lavon Paganini, PA-C  senna-docusate (SENOKOT-S) 8.6-50 MG tablet Take 2 tablets by mouth 2 (two) times daily. 07/19/20   Angiulli, Lavon Paganini, PA-C  simvastatin (ZOCOR) 40 MG tablet Take 1 tablet (40 mg total) by mouth daily. 07/19/20   Cathlyn Parsons, PA-C    Physical Exam: Vitals:   11/19/20 0345 11/19/20 0415 11/19/20 0430 11/19/20 0733  BP: 130/78 122/72 120/74 119/77  Pulse: 88 82 84 87  Resp: 20 (!) 23 (!) 24 Marland Kitchen)  25  Temp:    99 F (37.2 C)  TempSrc:    Oral  SpO2: 98% 96% 95% 94%  Weight:      Height:         . General:  Appears calm and comfortable and is in NAD; periodic cough that is productive of pink sputum . Eyes:  PERRL, EOMI, normal lids, iris . ENT:  grossly normal hearing, lips & tongue, mmm . Neck:  no LAD, masses or thyromegaly . Cardiovascular:  RRR, no m/r/g. No LE edema.  Marland Kitchen Respiratory:  LLL rhonchi.  Mildly increased respiratory effort. . Abdomen:  soft, NT, ND . Back:   normal alignment, no CVAT . Skin:  no rash or induration seen on limited exam . Musculoskeletal:  grossly normal tone BUE/BLE, good ROM, no bony abnormality . Lower extremity:  No LE edema.  Limited foot exam with no ulcerations.  2+ distal pulses. Marland Kitchen Psychiatric:  grossly normal mood and affect, speech fluent and appropriate, AOx3 . Neurologic:  CN 2-12 grossly intact, moves all extremities in coordinated fashion    Radiological Exams on Admission: Independently reviewed - see discussion in A/P where applicable  CT Angio Chest PE W and/or Wo Contrast  Result Date: 11/19/2020 CLINICAL DATA:  70 year old male status post fall yesterday striking left ribs again scar. Pain. Shallow inspiration. EXAM: CT ANGIOGRAPHY CHEST WITH CONTRAST TECHNIQUE: Multidetector CT imaging of the chest was performed using the standard  protocol during bolus administration of intravenous contrast. Multiplanar CT image reconstructions and MIPs were obtained to evaluate the vascular anatomy. CONTRAST:  142mL OMNIPAQUE IOHEXOL 350 MG/ML SOLN COMPARISON:  Portable chest 0436 hours today. CTA chest 06/26/2020. 01/19/2012 CT Abdomen and Pelvis FINDINGS: Cardiovascular: Adequate contrast bolus timing in the pulmonary arterial tree. Respiratory motion, respiratory motion, pronounced at the lung bases and also the right hilum. No central pulmonary thrombus, but in segmental branches of the right upper lobe (series 6, image 101) and both lower lobes (series 6, image 154 and 161) there is suggestion of small volume nonocclusive clot. However, saddle and hilar embolus was demonstrated in September. Therefore, this is likely the residual of previous pulmonary embolus. Stable cardiac size at the upper limits of normal. No pericardial effusion. Negative visible aorta aside from tortuosity and minimal plaque. Mediastinum/Nodes: Negative.  No mediastinal hematoma. Lungs/Pleura: Mostly loculated, minimally layering left pleural effusion with mildly hyperdense fluid loculated in the left lateral chest, but low-density appearing fluid elsewhere. Some fluid trapped in the major fissure. Superimposed confluent left lower lobe heterogeneous consolidation, where the left lower lobe was clear in September. Left major airways remain patent. No left pneumothorax. Right lung is negative aside from mild atelectasis. Upper Abdomen: Negative visible liver, gallbladder, spleen, pancreas, and bowel. Stable left adrenal thickening/nodularity greater on the left. Density there is fairly low (12 Hounsfield units, and some macroscopic fat seems to be visible (series 5, image 85) and furthermore this has not significantly changed since CT Abdomen and Pelvis 01/19/2012, consistent with benign adenoma. Stable visible kidneys, benign-appearing left upper pole cyst. Musculoskeletal: No  left-side rib fracture is identified. There is severe degeneration in the spine with S-shaped thoracolumbar scoliosis and chronic thoracolumbar junction laminectomy changes. Stable visualized osseous structures. Review of the MIP images confirms the above findings. IMPRESSION: 1. 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 2. Mixed loculated > layering left Pleural Effusion. Much of the fluid has low-density, but Hemothorax  is difficult to exclude in the setting of trauma. However, no overlying rib fracture is identified. 3. Superimposed left lower lobe consolidation more suggestive of Pneumonia than Pulmonary Contusion. Pulmonary infarct persisting since September is unlikely. 4. Stable benign adrenal thickening, left adrenal adenoma. Electronically Signed   By: Genevie Ann M.D.   On: 11/19/2020 05:49   DG Chest Port 1 View  Result Date: 11/19/2020 CLINICAL DATA:  Chest pain post fall. Fall yesterday from car. Left-sided pain radiating to the back. EXAM: PORTABLE CHEST 1 VIEW COMPARISON:  Radiograph and CT 06/26/2020 FINDINGS: Low lung volumes. Upper normal heart size, unchanged from prior exam. Aortic tortuosity is unchanged. Left pleural effusion which partially tracks laterally. Patchy retrocardiac opacity. No visualized pneumothorax. Right lung is clear. Scoliotic curvature of the spine. No visualized left rib fractures. IMPRESSION: Left pleural effusion with patchy retrocardiac opacity, may be contusion in the setting of injury. No visualized pneumothorax or left rib fractures. Electronically Signed   By: Keith Rake M.D.   On: 11/19/2020 03:53    EKG: Independently reviewed.  NSR with rate 88; LAFB; nonspecific ST changes with no evidence of acute ischemia   Labs on Admission: I have personally reviewed the available labs and imaging studies at the time of the admission.  Pertinent labs:    K+ 3.3 Glucose 158 BUN 26/Creatinine 1.48/GFR 51; 11/1.12/>60 HS troponin 13 WBC 10.4 Hgb 12.4 Platelets 446   Assessment/Plan Active Problems:   CAP (community acquired pneumonia)   CAP with L-sided effusion -Patient presenting with productive cough, fever to 101, hemoptysis, and infiltrate in left lower lobe on chest x-ray - consistent with pneumonia. -CXR also showed L pleural effusion and CTA was obtained which showed a mixed loculated/layering pleural effusion, possibly hemothorax (which is a consideration given recent fall, see below) -COVID-19 and flu testing is pending -Gram stain and blood cultures are pending -Will order lower respiratory tract procalcitonin level.   >0.5 indicates infection and >>0.5 indicates more serious disease.  As the procalcitonin level normalizes, it will be reasonable to consider de-escalation of antibiotic coverage.  The sensitivity of procalcitonin is variable and should not be used alone to guide treatment. -CURB-65 score is 2 - will admit the patient to Telemetry. -Pneumonia Severity Index (PSI) is Class 4, 9% mortality. -Will start Azithromycin 500 mg IV daily and Rocephin due to no risk factors for MDR cause; he was given Vanc/Zosyn in the ER x 1. -Additional complicating factors include: associated pleural effusion - will consult pulmonology (it appears that they have ordered IR drainage) -NS @ 75cc/hr -Fever control -Repeat CBC in am -Robitussin AC as needed for cough  AKI -Mild but creatinine is increased >0.3 from prior -Will hydrate -Will continue chlorthalidone for now but if AKI is not improved by tomorrow may need to consider holding -Recheck BMP in AM  Recent fall with L-sided rib contusion -Patient reports a fall against his car door yesterday, resulting in left-sided rib pain -CTA with loculated effusion, possible hemothorax -Awaiting IR drainage -Will add lidocaine patch for rib pain in addition to morphine for  breakthrough pain  H/o PE -Reports no longer taking Eliquis -No apparent PE on CTA -Will hold all AC due to hemoptysis and risk for hemothorax  HTN -Continue Atenolol-Chlorthalidone, Catapres  DM -Recent A1c was 6.7, indicating good control -hold Glucophage -Cover with moderate-scale SSI  HLD -Continue Zocor -Recent LDL was 82  Chronic back pain -I have reviewed this patient in the [place] Controlled Substances Reporting System.  He is receiving medications from only one provider and appears to be taking them as prescribed. -He is at increased high risk of opioid misuse, diversion, or overdose. -Will continue home Oxy IR and add prn IV morphine for breakthrough pain -Will also add a lidocaine patch for rib pain -Continue Cymbalta  Obesity -Body mass index is 36.62 kg/m..  -Weight loss should be encouraged -Outpatient PCP/bariatric medicine f/u encouraged  DNR -I have discussed code status with the patient and his wife and they are in agreement that the patient would not desire resuscitation and would prefer to die a natural death should that situation arise. -He will need a gold out of facility DNR form at the time of discharge    Note: This patient has been tested and is pending for the novel coronavirus COVID-19. He has been fully vaccinated against COVID-19.   Level of care: Telemetry Medical DVT prophylaxis: SCDs  Code Status:  DNR - confirmed with patient/family Family Communication: Wife was present throughout evaluation Disposition Plan:  The patient is from: home  Anticipated d/c is to: home without [facility] services   Anticipated d/c date will depend on clinical response to treatment, likely 2-3 days  Patient is currently: acutely ill Consults called: Pulmonology Admission status:Admit - It is my clinical opinion that admission to INPATIENT is reasonable and necessary because of the expectation that this patient will require hospital care that crosses at least 2  midnights to treat this condition based on the medical complexity of the problems presented.  Given the aforementioned information, the predictability of an adverse outcome is felt to be significant.   Karmen Bongo MD [facility]   How to contact the [facility] Attending or Consulting provider [place] or covering provider during after hours [place], for this patient?  1. Check the care team in [facility] and look for a) attending/consulting [facility] provider listed and b) the [facility] team listed 2. Log into [email or web address] and use [facility]'s universal password to access. If you do not have the password, please contact the hospital operator. 3. Locate the [facility] provider you are looking for under [facility] and page to a number that you can be directly reached. 4. If you still have difficulty reaching the provider, please page the [facility] (Director on Call) for the Hospitalists listed on amion for assistance.   11/19/2020, 7:37 AM

## 2020-11-19 NOTE — ED Notes (Signed)
Pt transported to IR 

## 2020-11-19 NOTE — ED Notes (Signed)
Placed on 2L O2/[place]

## 2020-11-19 NOTE — ED Triage Notes (Signed)
Pt BIB EMS from home. Pt fell yesterday from car, hit ribs against car.   L sided chest pain radiating to back, hurts to breathe in/out w/ shallow respirations.  Coughing up phlegm with blood. 06/26/20 had MI   EMS: HR 86 NSR with EMS 136/86 99% RA RR 16 at rest, 25 during movement

## 2020-11-19 NOTE — ED Notes (Signed)
Stop cocked opened to allow sx as ordered per md

## 2020-11-19 NOTE — Procedures (Signed)
Pleural Fibrinolytic Administration Procedure Note  Keith [name]  [id number]  03-Feb-1951  Date:11/19/20  Time:6:25 PM   Provider Performing:Cindy Brindisi Mamie Nick Carlis Abbott   Procedure: Pleural Fibrinolysis Initial day [phone])  Indication(s) Fibrinolysis of complicated pleural effusion  Consent Risks of the procedure as well as the alternatives and risks of each were explained to the patient and/or caregiver.  Consent for the procedure was obtained.   Anesthesia None   Time Out Verified patient identification, verified procedure, site/side was marked, verified correct patient position, special equipment/implants available, medications/allergies/relevant history reviewed, required imaging and test results available.   Sterile Technique Hand hygiene, gloves   Procedure Description Existing pleural catheter was cleaned and accessed in sterile manner.  10mg  of tPA in 30cc of saline and 5mg  of dornase in 30cc of sterile water were injected into pleural space using existing pleural catheter.  Catheter will be clamped for 1 hour and then placed back to suction.   Complications/Tolerance None; patient tolerated the procedure well.   EBL None   Specimen(s) None    Dilaudid IV added for pain control. Patient needs to lay flat for 30 minutes to resolve apical loculations. Discussed with RN at bedside.  Julian Hy, DO 11/19/20 6:25 PM [facility]

## 2020-11-19 NOTE — ED Provider Notes (Signed)
[facility] EMERGENCY DEPARTMENT Provider Note   CSN: [id number] Arrival date & time: 11/19/20  0309     History No chief complaint on file.   Keith [name] is a 70 y.o. male.  70 yo M with a chief complaint of hemoptysis and left-sided chest pain.  Patient has a history of a PE and thinks this feels somewhat similar.  Patient had been coughing up blood for couple days now.  And up having a fall yesterday morning and then developed severe left-sided chest pain a few hours ago.  Denies fevers.  Pain is worse with movement and breathing coughing.  The history is provided by the patient.  Illness Severity:  Moderate Onset quality:  Gradual Duration:  2 days Timing:  Constant Progression:  Worsening Chronicity:  Recurrent Associated symptoms: chest pain, cough and shortness of breath   Associated symptoms: no abdominal pain, no congestion, no diarrhea, no fever, no headaches, no myalgias, no rash and no vomiting        Past Medical History:  Diagnosis Date  . Allergy   . Chronic back pain   . Diabetes mellitus without complication ([place])   . Duodenal ulcer hemorrhage 08/29/2014  . ED (erectile dysfunction)   . Esophageal stricture 08/30/2014  . Glucose intolerance (impaired glucose tolerance)   . Hiatal hernia 08/30/2014  . Hypertension   . Hypokalemia 04/11/2013  . 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 Obesity   . Osteoarthritis   . Scoliosis 2016  . Spinal stenosis of lumbar region   . Urinary tract infection 04/11/2013    Patient Active Problem List   Diagnosis Date Noted  . CAP (community acquired pneumonia) 11/19/2020  . Closed right ankle fracture   . Labile blood glucose   . Hypokalemia   . Drug induced constipation   . Debility   . Hyponatremia   . Diabetic peripheral neuropathy ([place])   . Acute blood loss anemia   . Chronic pain syndrome   . Postoperative pain   . Pulmonary emboli ([place]) 07/04/2020  . Closed right trimalleolar fracture  07/02/2020  . Acute respiratory failure with hypoxia ([place])   . Acute saddle pulmonary embolism with acute cor pulmonale ([place]) 06/26/2020  . Lumbar spinal stenosis 06/01/2020  . Spinal cord compression ([place]) 04/02/2020  . Thoracic myelopathy 07/25/2018  . Hyperlipidemia associated with type 2 diabetes mellitus ([place]) 11/14/2016  . Type 2 diabetes mellitus with diabetic neuropathy, unspecified ([place]) 11/14/2016  . Routine general medical examination at a health care facility 11/14/2015  . Hiatal hernia 08/30/2014  . AKI (acute kidney injury) ([place]) 04/10/2013  . Allergic rhinitis 04/10/2013  . Spinal stenosis of lumbar region 11/06/2012  . Allergic rhinitis due to pollen 01/15/2011  . Osteoarthritis 11/02/2008  . HIP PAIN, LEFT 02/09/2008  . Morbid obesity ([place]) 11/07/2007  . ERECTILE DYSFUNCTION, MILD 11/07/2007  . Essential hypertension 11/07/2007    Past Surgical History:  Procedure Laterality Date  . COLONOSCOPY  2008  . ESOPHAGOGASTRODUODENOSCOPY N/A 08/29/2014   Procedure: ESOPHAGOGASTRODUODENOSCOPY (EGD);  Surgeon: Lafayette Dragon, MD;  Location: [facility];  Service: Endoscopy;  Laterality: N/A;  . LAMINECTOMY N/A 07/28/2018   Procedure: THORACIC ELEVEN- THORACIC TWELVE POSTERIOR DECOMPRESSION LAMINECTOMY;  Surgeon: Judith Part, MD;  Location: [facility];  Service: Neurosurgery;  Laterality: N/A;  . LUMBAR LAMINECTOMY    . NASAL POLYP SURGERY    . ORIF ANKLE FRACTURE Right 07/01/2020   Procedure: OPEN REDUCTION INTERNAL FIXATION (ORIF) ANKLE FRACTURE. TRIMALLEOLAR;  Surgeon: Mardelle Matte,  Vonna Kotyk, MD;  Location: [facility];  Service: Orthopedics;  Laterality: Right;  . TONSILLECTOMY         Family History  Problem Relation Age of Onset  . Diabetes Mother   . Asthma Mother   . Hypertension Father   . Stroke Father   . Colon cancer Neg Hx     Social History   Tobacco Use  . Smoking status: Former Smoker    Packs/day: 1.00    Years: 25.00    Pack years: 25.00  . Smokeless  tobacco: Never Used  Vaping Use  . Vaping Use: Never used  Substance Use Topics  . Alcohol use: Not Currently    Comment: Christmas/4th of July  . Drug use: No    Home Medications Prior to Admission medications   Medication Sig Start Date End Date Taking? Authorizing Provider  apixaban (ELIQUIS) 5 MG TABS tablet Take 1 tablet (5 mg total) by mouth 2 (two) times daily. 07/19/20   Angiulli, Lavon Paganini, PA-C  atenolol-chlorthalidone (TENORETIC) 50-25 MG tablet Take 1 tablet by mouth daily. Pt needs appt for further refills. 07/19/20   Angiulli, Lavon Paganini, PA-C  cloNIDine (CATAPRES) 0.1 MG tablet Take 1 tablet (0.1 mg total) by mouth daily. 07/19/20   Angiulli, Lavon Paganini, PA-C  docusate sodium (COLACE) 100 MG capsule Take 1 capsule (100 mg total) by mouth 2 (two) times daily. 06/04/20   Viona Gilmore D, NP  DULoxetine (CYMBALTA) 30 MG capsule Take 2 capsules (60 mg total) by mouth daily. 07/19/20   Angiulli, Lavon Paganini, PA-C  metFORMIN (GLUCOPHAGE) 500 MG tablet Take 0.5 tablets (250 mg total) by mouth daily with breakfast. 07/19/20   Angiulli, Lavon Paganini, PA-C  Multiple Vitamin (MULITIVITAMIN WITH MINERALS) TABS Take 1 tablet by mouth daily.    [provider]  oxyCODONE (OXY IR/ROXICODONE) 5 MG immediate release tablet Take 1-2 tablets (5-10 mg total) by mouth every 6 (six) hours as needed for severe pain ((score 7 to 10)). 07/19/20   Angiulli, Lavon Paganini, PA-C  polyethylene glycol (MIRALAX / GLYCOLAX) 17 g packet Take 17 g by mouth 2 (two) times daily. 07/19/20   Angiulli, Lavon Paganini, PA-C  potassium chloride SA (KLOR-CON) 20 MEQ tablet Take 1 tablet (20 mEq total) by mouth daily. 07/19/20   Angiulli, Lavon Paganini, PA-C  senna-docusate (SENOKOT-S) 8.6-50 MG tablet Take 2 tablets by mouth 2 (two) times daily. 07/19/20   Angiulli, Lavon Paganini, PA-C  simvastatin (ZOCOR) 40 MG tablet Take 1 tablet (40 mg total) by mouth daily. 07/19/20   Angiulli, Lavon Paganini, PA-C    Allergies    Lisinopril  Review of  Systems   Review of Systems  Constitutional: Negative for chills and fever.  HENT: Negative for congestion and facial swelling.   Eyes: Negative for discharge and visual disturbance.  Respiratory: Positive for cough and shortness of breath.   Cardiovascular: Positive for chest pain. Negative for palpitations.  Gastrointestinal: Negative for abdominal pain, diarrhea and vomiting.  Musculoskeletal: Negative for arthralgias and myalgias.  Skin: Negative for color change and rash.  Neurological: Negative for tremors, syncope and headaches.  Psychiatric/Behavioral: Negative for confusion and dysphoric mood.    Physical Exam Updated Vital Signs BP 120/74   Pulse 84   Temp 98.7 F (37.1 C) (Oral)   Resp (!) 24   Ht 6' (1.829 m)   Wt 122.5 kg   SpO2 95%   BMI 36.62 kg/m   Physical Exam Vitals and nursing note reviewed.  Constitutional:  Appearance: He is well-developed and well-nourished.  HENT:     Head: Normocephalic and atraumatic.  Eyes:     Extraocular Movements: EOM normal.     Pupils: Pupils are equal, round, and reactive to light.  Neck:     Vascular: No JVD.  Cardiovascular:     Rate and Rhythm: Normal rate and regular rhythm.     Heart sounds: No murmur heard. No friction rub. No gallop.   Pulmonary:     Effort: No respiratory distress.     Breath sounds: No wheezing.  Abdominal:     General: There is no distension.     Tenderness: There is no guarding or rebound.  Musculoskeletal:        General: Tenderness present. Normal range of motion.     Cervical back: Normal range of motion and neck supple.     Comments: Tenderness with palpation of the L chest wall about the posterior rib angles.  Chronically left foot laterally deviated  Skin:    Coloration: Skin is not pale.     Findings: No rash.  Neurological:     Mental Status: He is alert and oriented to person, place, and time.  Psychiatric:        Mood and Affect: Mood and affect normal.         Behavior: Behavior normal.     ED Results / Procedures / Treatments   Labs (all labs ordered are listed, but only abnormal results are displayed) Labs Reviewed  CBC WITH DIFFERENTIAL/PLATELET - Abnormal; Notable for the following components:      Result Value   Hemoglobin 12.4 (*)    RDW 15.7 (*)    Platelets 446 (*)    Neutro Abs 8.4 (*)    Lymphs Abs 0.6 (*)    Monocytes Absolute 1.3 (*)    All other components within normal limits  BASIC METABOLIC PANEL - Abnormal; Notable for the following components:   Potassium 3.3 (*)    Glucose, Bld 158 (*)    BUN 26 (*)    Creatinine, Ser 1.48 (*)    GFR, Estimated 51 (*)    All other components within normal limits  I-STAT CHEM 8, ED - Abnormal; Notable for the following components:   Potassium 3.3 (*)    BUN 31 (*)    Creatinine, Ser 1.40 (*)    Glucose, Bld 156 (*)    All other components within normal limits  CULTURE, BLOOD (ROUTINE X 2)  CULTURE, BLOOD (ROUTINE X 2)  TROPONIN I (HIGH SENSITIVITY)    EKG EKG Interpretation  Date/Time:  Saturday November 19 2020 03:17:36 EST Ventricular Rate:  88 PR Interval:    QRS Duration: 101 QT Interval:  391 QTC Calculation: 474 R Axis:   -45 Text Interpretation: Sinus rhythm Left anterior fascicular block Abnormal R-wave progression, late transition Minimal ST depression, lateral leads qrs narrowing Otherwise no significant change Confirmed by Deno Etienne [phone]) on 11/19/2020 3:22:35 AM   Radiology CT Angio Chest PE W and/or Wo Contrast  Result Date: 11/19/2020 CLINICAL DATA:  70 year old male status post fall yesterday striking left ribs again scar. Pain. Shallow inspiration. EXAM: CT ANGIOGRAPHY CHEST WITH CONTRAST TECHNIQUE: Multidetector CT imaging of the chest was performed using the standard protocol during bolus administration of intravenous contrast. Multiplanar CT image reconstructions and MIPs were obtained to evaluate the vascular anatomy. CONTRAST:  137mL OMNIPAQUE  IOHEXOL 350 MG/ML SOLN COMPARISON:  Portable chest 0436 hours today. CTA chest 06/26/2020. 01/19/2012  CT Abdomen and Pelvis FINDINGS: Cardiovascular: Adequate contrast bolus timing in the pulmonary arterial tree. Respiratory motion, respiratory motion, pronounced at the lung bases and also the right hilum. No central pulmonary thrombus, but in segmental branches of the right upper lobe (series 6, image 101) and both lower lobes (series 6, image 154 and 161) there is suggestion of small volume nonocclusive clot. However, saddle and hilar embolus was demonstrated in September. Therefore, this is likely the residual of previous pulmonary embolus. Stable cardiac size at the upper limits of normal. No pericardial effusion. Negative visible aorta aside from tortuosity and minimal plaque. Mediastinum/Nodes: Negative.  No mediastinal hematoma. Lungs/Pleura: Mostly loculated, minimally layering left pleural effusion with mildly hyperdense fluid loculated in the left lateral chest, but low-density appearing fluid elsewhere. Some fluid trapped in the major fissure. Superimposed confluent left lower lobe heterogeneous consolidation, where the left lower lobe was clear in September. Left major airways remain patent. No left pneumothorax. Right lung is negative aside from mild atelectasis. Upper Abdomen: Negative visible liver, gallbladder, spleen, pancreas, and bowel. Stable left adrenal thickening/nodularity greater on the left. Density there is fairly low (12 Hounsfield units, and some macroscopic fat seems to be visible (series 5, image 85) and furthermore this has not significantly changed since CT Abdomen and Pelvis 01/19/2012, consistent with benign adenoma. Stable visible kidneys, benign-appearing left upper pole cyst. Musculoskeletal: No left-side rib fracture is identified. There is severe degeneration in the spine with S-shaped thoracolumbar scoliosis and chronic thoracolumbar junction laminectomy changes. Stable  visualized osseous structures. Review of the MIP images confirms the above findings. IMPRESSION: 1. 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 2. Mixed loculated > layering left Pleural Effusion. Much of the fluid has low-density, but Hemothorax is difficult to exclude in the setting of trauma. However, no overlying rib fracture is identified. 3. Superimposed left lower lobe consolidation more suggestive of Pneumonia than Pulmonary Contusion. Pulmonary infarct persisting since September is unlikely. 4. Stable benign adrenal thickening, left adrenal adenoma. Electronically Signed   By: Genevie Ann M.D.   On: 11/19/2020 05:49   DG Chest Port 1 View  Result Date: 11/19/2020 CLINICAL DATA:  Chest pain post fall. Fall yesterday from car. Left-sided pain radiating to the back. EXAM: PORTABLE CHEST 1 VIEW COMPARISON:  Radiograph and CT 06/26/2020 FINDINGS: Low lung volumes. Upper normal heart size, unchanged from prior exam. Aortic tortuosity is unchanged. Left pleural effusion which partially tracks laterally. Patchy retrocardiac opacity. No visualized pneumothorax. Right lung is clear. Scoliotic curvature of the spine. No visualized left rib fractures. IMPRESSION: Left pleural effusion with patchy retrocardiac opacity, may be contusion in the setting of injury. No visualized pneumothorax or left rib fractures. Electronically Signed   By: Keith Rake M.D.   On: 11/19/2020 03:53    Procedures Procedures   Medications Ordered in ED Medications  vancomycin (VANCOREADY) IVPB 2000 mg/400 mL (has no administration in time range)  piperacillin-tazobactam (ZOSYN) IVPB 3.375 g (has no administration in time range)  morphine 4 MG/ML injection 4 mg (has no administration in time range)  morphine 4 MG/ML injection 4 mg (4 mg Intravenous Given 11/19/20 0333)  ondansetron (ZOFRAN) injection 4 mg (4 mg Intravenous  Given 11/19/20 0333)  iohexol (OMNIPAQUE) 350 MG/ML injection 100 mL (100 mLs Intravenous Contrast Given 11/19/20 0454)  morphine 4 MG/ML injection 4 mg (4 mg Intravenous Given 11/19/20 0514)  ondansetron (ZOFRAN) injection 4 mg (4  mg Intravenous Given 11/19/20 0514)    ED Course  I have reviewed the triage vital signs and the nursing notes.  Pertinent labs & imaging results that were available during my care of the patient were reviewed by me and considered in my medical decision making (see chart for details).    MDM Rules/Calculators/A&P                          70 yo M with a chief complaints of left-sided chest pain and hemoptysis.  Feels like when the patient had a pulmonary embolism in the past.  Had a recent trauma yesterday before the pain started.  Will obtain a laboratory evaluation.  Likely CT angiogram of the chest.  CT scan of the chest concerning for a loculated pleural effusion on the left side.  There is no signs of rib fracture.  I feel like this is unlikely to be traumatic hemothorax.  He was having symptoms as well prior to the fall.  I will start the patient on broad-spectrum antibiotics.  Blood cultures.  Discussed with medicine for admission.  The patients results and plan were reviewed and discussed.   Any x-rays performed were independently reviewed by myself.   Differential diagnosis were considered with the presenting HPI.  Medications  vancomycin (VANCOREADY) IVPB 2000 mg/400 mL (has no administration in time range)  piperacillin-tazobactam (ZOSYN) IVPB 3.375 g (has no administration in time range)  morphine 4 MG/ML injection 4 mg (has no administration in time range)  morphine 4 MG/ML injection 4 mg (4 mg Intravenous Given 11/19/20 0333)  ondansetron (ZOFRAN) injection 4 mg (4 mg Intravenous Given 11/19/20 0333)  iohexol (OMNIPAQUE) 350 MG/ML injection 100 mL (100 mLs Intravenous Contrast Given 11/19/20 0454)  morphine 4 MG/ML injection 4 mg (4 mg Intravenous  Given 11/19/20 0514)  ondansetron (ZOFRAN) injection 4 mg (4 mg Intravenous Given 11/19/20 0514)    Vitals:   11/19/20 0319 11/19/20 0345 11/19/20 0415 11/19/20 0430  BP:  130/78 122/72 120/74  Pulse:  88 82 84  Resp:  20 (!) 23 (!) 24  Temp: 98.7 F (37.1 C)     TempSrc: Oral     SpO2:  98% 96% 95%  Weight:      Height:        Final diagnoses:  Loculated pleural effusion    Admission/ observation were discussed with the admitting physician, patient and/or family and they are comfortable with the plan.   Final Clinical Impression(s) / ED Diagnoses Final diagnoses:  Loculated pleural effusion    Rx / DC Orders ED Discharge Orders    None       Deno Etienne, DO 11/19/20 [phone]

## 2020-11-19 NOTE — Consult Note (Signed)
Chief Complaint: Empyema. Request is for left sided chest tube placement  Referring Physician(s): Dr. Renaldo Harrison  Supervising Physician: Jacqulynn Cadet  Patient Status: Keith [name] - ED  History of Present Illness: Keith [name] is a 70 y.o. male History of PE, DM, Duodenal ulcer with hemorrhage, HTN, anemia. Fell yesterday and has had hemoptysis x several; days and left sided chest pain since a fall that occurred yesterday. Found to have a loculated pleural effusion and superimposed consolidation.  CT angio from 2.12.22 reads Mixed loculated > layering left Pleural Effusion. Much of the fluid has low-density, but Hemothorax is difficult to exclude in the setting of trauma. However, no overlying rib fracture is identified. Team is requesting left sided chest tube for empyema.    Keith [name] endorses persistent chest pain with [name] and emptysis. he states that he "thought I was having a heart attack" and attributes his condition to sleeping with a fan the last coupe of night. Condition is made better by nothing. Condition is made worse with movement and respirations. Return precautions and treatment recommendations and follow-up discussed with the patient who is agreeable with the plan.  Past Medical History:  Diagnosis Date  . Allergy   . Chronic back pain   . Diabetes mellitus without complication ([place])   . DNR (do not resuscitate) 11/19/2020  . Duodenal ulcer hemorrhage 08/29/2014  . ED (erectile dysfunction)   . Esophageal stricture 08/30/2014  . Glucose intolerance (impaired glucose tolerance)   . Hiatal hernia 08/30/2014  . Hypertension   . Hypokalemia 04/11/2013  . 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 Obesity   . Osteoarthritis   . Pulmonary embolism ([place]) 06/2020  . Scoliosis 2016  . Spinal stenosis of lumbar region   . Urinary tract infection 04/11/2013    Past Surgical History:  Procedure Laterality Date  . COLONOSCOPY  2008  . ESOPHAGOGASTRODUODENOSCOPY N/A 08/29/2014    Procedure: ESOPHAGOGASTRODUODENOSCOPY (EGD);  Surgeon: Lafayette Dragon, MD;  Location: [facility];  Service: Endoscopy;  Laterality: N/A;  . LAMINECTOMY N/A 07/28/2018   Procedure: THORACIC ELEVEN- THORACIC TWELVE POSTERIOR DECOMPRESSION LAMINECTOMY;  Surgeon: Judith Part, MD;  Location: [facility];  Service: Neurosurgery;  Laterality: N/A;  . LUMBAR LAMINECTOMY    . NASAL POLYP SURGERY    . ORIF ANKLE FRACTURE Right 07/01/2020   Procedure: OPEN REDUCTION INTERNAL FIXATION (ORIF) ANKLE FRACTURE. TRIMALLEOLAR;  Surgeon: Marchia Bond, MD;  Location: [facility];  Service: Orthopedics;  Laterality: Right;  . TONSILLECTOMY      Allergies: Lisinopril  Medications: Prior to Admission medications   Medication Sig Start Date End Date Taking? Authorizing Provider  apixaban (ELIQUIS) 5 MG TABS tablet Take 1 tablet (5 mg total) by mouth 2 (two) times daily. 07/19/20   Angiulli, Lavon Paganini, PA-C  atenolol-chlorthalidone (TENORETIC) 50-25 MG tablet Take 1 tablet by mouth daily. Pt needs appt for further refills. 07/19/20   Angiulli, Lavon Paganini, PA-C  cloNIDine (CATAPRES) 0.1 MG tablet Take 1 tablet (0.1 mg total) by mouth daily. 07/19/20   Angiulli, Lavon Paganini, PA-C  docusate sodium (COLACE) 100 MG capsule Take 1 capsule (100 mg total) by mouth 2 (two) times daily. 06/04/20   Viona Gilmore D, NP  DULoxetine (CYMBALTA) 30 MG capsule Take 2 capsules (60 mg total) by mouth daily. 07/19/20   Angiulli, Lavon Paganini, PA-C  metFORMIN (GLUCOPHAGE) 500 MG tablet Take 0.5 tablets (250 mg total) by mouth daily with breakfast. 07/19/20   Angiulli, Lavon Paganini, PA-C  Multiple Vitamin (MULITIVITAMIN WITH  MINERALS) TABS Take 1 tablet by mouth daily.    [provider]  oxyCODONE (OXY IR/ROXICODONE) 5 MG immediate release tablet Take 1-2 tablets (5-10 mg total) by mouth every 6 (six) hours as needed for severe pain ((score 7 to 10)). 07/19/20   Angiulli, Lavon Paganini, PA-C  polyethylene glycol (MIRALAX / GLYCOLAX) 17 g packet  Take 17 g by mouth 2 (two) times daily. 07/19/20   Angiulli, Lavon Paganini, PA-C  potassium chloride SA (KLOR-CON) 20 MEQ tablet Take 1 tablet (20 mEq total) by mouth daily. 07/19/20   Angiulli, Lavon Paganini, PA-C  senna-docusate (SENOKOT-S) 8.6-50 MG tablet Take 2 tablets by mouth 2 (two) times daily. 07/19/20   Angiulli, Lavon Paganini, PA-C  simvastatin (ZOCOR) 40 MG tablet Take 1 tablet (40 mg total) by mouth daily. 07/19/20   Angiulli, Lavon Paganini, PA-C     Family History  Problem Relation Age of Onset  . Diabetes Mother   . Asthma Mother   . Hypertension Father   . Stroke Father   . Colon cancer Neg Hx     Social History   Socioeconomic History  . Marital status: Married    Spouse name: Not on file  . Number of children: Not on file  . Years of education: Not on file  . Highest education level: Not on file  Occupational History  . Occupation: retired  Tobacco Use  . Smoking status: Former Smoker    Packs/day: 1.00    Years: 25.00    Pack years: 25.00    Quit date: 1995    Years since quitting: 27.1  . Smokeless tobacco: Never Used  Vaping Use  . Vaping Use: Never used  Substance and Sexual Activity  . Alcohol use: Yes    Comment: holidays and special occasions  . Drug use: No  . Sexual activity: Yes    Birth control/protection: None  Other Topics Concern  . Not on file  Social History Narrative   Married.     Social Determinants of Health   Financial Resource Strain: Not on file  Food Insecurity: Not on file  Transportation Needs: Not on file  Physical Activity: Not on file  Stress: Not on file  Social Connections: Not on file     Review of Systems: A 12 point ROS discussed and pertinent positives are indicated in the HPI above.  All other systems are negative.  Review of Systems  Constitutional: Negative for fever.  HENT: Negative for congestion.   Respiratory: Positive for cough and shortness of breath.   Cardiovascular: Positive for chest pain.   Gastrointestinal: Negative for abdominal pain.  Neurological: Negative for headaches.  Psychiatric/Behavioral: Negative for behavioral problems and confusion.    Vital Signs: BP 119/77   Pulse 87   Temp 99 F (37.2 C) (Oral)   Resp (!) 25   Ht 6' (1.829 m)   Wt 270 lb (122.5 kg)   SpO2 94%   BMI 36.62 kg/m   Physical Exam Vitals and nursing note reviewed.  Constitutional:      Appearance: He is well-developed and well-nourished.  HENT:     Head: Normocephalic.  Cardiovascular:     Rate and Rhythm: Normal rate and regular rhythm.  Pulmonary:     Effort: Pulmonary effort is normal.     Comments: Diminished left side Musculoskeletal:        General: Normal range of motion.     Cervical back: Normal range of motion.  Skin:  General: Skin is dry.  Neurological:     Mental Status: He is alert and oriented to person, place, and time.  Psychiatric:        Mood and Affect: Mood and affect normal.     Imaging: CT Angio Chest PE W and/or Wo Contrast  Result Date: 11/19/2020 CLINICAL DATA:  70 year old male status post fall yesterday striking left ribs again scar. Pain. Shallow inspiration. EXAM: CT ANGIOGRAPHY CHEST WITH CONTRAST TECHNIQUE: Multidetector CT imaging of the chest was performed using the standard protocol during bolus administration of intravenous contrast. Multiplanar CT image reconstructions and MIPs were obtained to evaluate the vascular anatomy. CONTRAST:  177mL OMNIPAQUE IOHEXOL 350 MG/ML SOLN COMPARISON:  Portable chest 0436 hours today. CTA chest 06/26/2020. 01/19/2012 CT Abdomen and Pelvis FINDINGS: Cardiovascular: Adequate contrast bolus timing in the pulmonary arterial tree. Respiratory motion, respiratory motion, pronounced at the lung bases and also the right hilum. No central pulmonary thrombus, but in segmental branches of the right upper lobe (series 6, image 101) and both lower lobes (series 6, image 154 and 161) there is suggestion of small volume  nonocclusive clot. However, saddle and hilar embolus was demonstrated in September. Therefore, this is likely the residual of previous pulmonary embolus. Stable cardiac size at the upper limits of normal. No pericardial effusion. Negative visible aorta aside from tortuosity and minimal plaque. Mediastinum/Nodes: Negative.  No mediastinal hematoma. Lungs/Pleura: Mostly loculated, minimally layering left pleural effusion with mildly hyperdense fluid loculated in the left lateral chest, but low-density appearing fluid elsewhere. Some fluid trapped in the major fissure. Superimposed confluent left lower lobe heterogeneous consolidation, where the left lower lobe was clear in September. Left major airways remain patent. No left pneumothorax. Right lung is negative aside from mild atelectasis. Upper Abdomen: Negative visible liver, gallbladder, spleen, pancreas, and bowel. Stable left adrenal thickening/nodularity greater on the left. Density there is fairly low (12 Hounsfield units, and some macroscopic fat seems to be visible (series 5, image 85) and furthermore this has not significantly changed since CT Abdomen and Pelvis 01/19/2012, consistent with benign adenoma. Stable visible kidneys, benign-appearing left upper pole cyst. Musculoskeletal: No left-side rib fracture is identified. There is severe degeneration in the spine with S-shaped thoracolumbar scoliosis and chronic thoracolumbar junction laminectomy changes. Stable visualized osseous structures. Review of the MIP images confirms the above findings. IMPRESSION: 1. 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 2. Mixed loculated > layering left Pleural Effusion. Much of the fluid has low-density, but Hemothorax is difficult to exclude in the setting of trauma. However, no overlying rib fracture is identified. 3. Superimposed left lower lobe consolidation more  suggestive of Pneumonia than Pulmonary Contusion. Pulmonary infarct persisting since September is unlikely. 4. Stable benign adrenal thickening, left adrenal adenoma. Electronically Signed   By: Genevie Ann M.D.   On: 11/19/2020 05:49   DG Chest Port 1 View  Result Date: 11/19/2020 CLINICAL DATA:  Chest pain post fall. Fall yesterday from car. Left-sided pain radiating to the back. EXAM: PORTABLE CHEST 1 VIEW COMPARISON:  Radiograph and CT 06/26/2020 FINDINGS: Low lung volumes. Upper normal heart size, unchanged from prior exam. Aortic tortuosity is unchanged. Left pleural effusion which partially tracks laterally. Patchy retrocardiac opacity. No visualized pneumothorax. Right lung is clear. Scoliotic curvature of the spine. No visualized left rib fractures. IMPRESSION: Left pleural effusion with patchy retrocardiac opacity, may be contusion in the setting of injury. No visualized  pneumothorax or left rib fractures. Electronically Signed   By: Keith Rake M.D.   On: 11/19/2020 03:53    Labs:  CBC: Recent Labs    07/05/20 0456 07/15/20 0603 07/18/20 0633 11/19/20 0325 11/19/20 0419  WBC 8.0 6.9 6.4 10.4  --   HGB 10.4* 10.4* 10.1* 12.4* 13.6  HCT 32.0* 31.5* 32.1* 39.0 40.0  PLT 389 403* 360 446*  --     COAGS: Recent Labs    06/26/20 2001 06/27/20 0101  INR 1.1  --   APTT  --  72*    BMP: Recent Labs    07/01/20 0338 07/03/20 0640 07/05/20 0456 07/11/20 0619 07/13/20 1036 07/19/20 0453 08/01/20 1059 11/19/20 0325 11/19/20 0419  NA 139 137 134* 137 138 139  --  136 140  K 3.1* 4.3 4.0 3.2* 3.3* 3.3* 3.5 3.3* 3.3*  CL 105 107 102 100 99 102  --  99 101  CO2 23 19* 20* 27 27 26   --  26  --   GLUCOSE 263* 121* 167* 127* 123* 128*  --  158* 156*  BUN 13 16 12 12 11 11   --  26* 31*  CALCIUM 9.6 9.5 9.9 9.7 10.2 9.9  --  9.9  --   CREATININE 1.16 1.12 1.16 1.10 1.10 1.12  --  1.48* 1.40*  GFRNONAA >60 >60 >60 >60 >60 >60  --  51*  --   GFRAA >60 >60 >60 >60  --   --    --   --   --     LIVER FUNCTION TESTS: Recent Labs    06/29/20 0415 07/01/20 0338 07/03/20 0640 07/05/20 0456  BILITOT 0.8 0.9 0.6 0.6  AST 59* 39 59* 33  ALT 80* 53* 42 32  ALKPHOS 91 87 78 82  PROT 7.2 7.4 6.9 7.4  ALBUMIN 3.6 3.4* 3.2* 3.3*     Assessment and Plan:  70 y.o. male inpatient (in the ED). History of PE, DM, Duodenal ulcer with hemorrhage, HTN, anemia. Fell yesterday and has had hemoptysis x several; days and left sided chest pain since a fall that occurred yesterday. Found to have a loculated pleural effusion and superimposed consolidation.  CT angio from 2.12.22 reads Mixed loculated > layering left Pleural Effusion. Much of the fluid has low-density, but Hemothorax is difficult to exclude in the setting of trauma. However, no overlying rib fracture is identified. Team is requesting left sided chest tube for empyema.    COVID -. Last recorded temperature 99.0. No leukocytosis. BUN 31, Cr 1.4. Patient states that he has not taking eliquis in several months. No pertinent allergies. Patient has been NPO since midnight.   IR consulted for possible left sided chest tube placement. Case has been reviewed and procedure approved by Dr. Laurence Ferrari.  Patient tentatively scheduled for 2.12.22.  Team instructed to: Keep Patient to be NPO after midnight  IR will call patient when ready.  Risks and benefits discussed with the patient including bleeding, infection, damage to adjacent structures, bowel perforation/fistula connection, and sepsis.  All of the patient's questions were answered, patient is agreeable to proceed. Consent signed and in CT control room.     Thank you for this interesting consult.  I greatly enjoyed meeting JOFFREY KERCE and look forward to participating in their care.  A copy of this report was sent to the requesting provider on this date.  Electronically Signed: Jacqualine Mau, NP 11/19/2020, 10:07 AM   I spent a total  of  40 Minutes    in face to face in clinical consultation, greater than 50% of which was counseling/coordinating care for left sided chest tube placement

## 2020-11-19 NOTE — Consult Note (Signed)
Name: Keith [name] MRN: [id number] DOB: Jan 07, 1951    ADMISSION DATE:  11/19/2020 CONSULTATION DATE: 11/19/2018.  REFERRING MD : [facility]  CHIEF COMPLAINT: Dyspnea  BRIEF PATIENT DESCRIPTION: 70 year old obese male with hemoptysis prior to a fall with left chest injury and loculated left fusion  SIGNIFICANT EVENTS  09/17/2021 fall with left chest injury  STUDIES:  09/18/2021 CT of the chest loculated left effusion   HISTORY OF PRESENT ILLNESS:   70 year old male with a well-documented past medical history who was in his usual state of medium health he is walker bound due to injuries to his left leg and back pain he has been on anticoagulants in the past but not currently taking any 5 days ago he started having fevers chills purulent sputum from cough.  He noted he was so uncomfortable he felt lightheaded.  2 days ago he fell hit the car door with the left side of his chest and in the posterior is noted excruciating pain since then.  He notes hemoptysis that started prior to the injury.  He was seen in the emergency room 11/19/2020 CT scan revealed a loculated left effusion and pulmonary critical care was called for chest tube insertion.  After reviewing the CT scan Dr. Carlis Abbott is asked interventional radiology to place the chest tube due to the positioning of the effusion posterior to the scapula.  Interventional radiology is aware.  Currently [name] is in no distress he is awake alert saturating well.  He has been fully vaccinated against Covid and is Covid testing is negative at this time.  Pulmonary critical care will continue to follow along should he need lytic therapy for his effusions as it is loculated.  PAST MEDICAL HISTORY :   has a past medical history of Allergy, Chronic back pain, Diabetes mellitus without complication ([place]), DNR (do not resuscitate) (11/19/2020), Duodenal ulcer hemorrhage (08/29/2014), ED (erectile dysfunction), Esophageal stricture (08/30/2014), Glucose  intolerance (impaired glucose tolerance), Hiatal hernia (08/30/2014), Hypertension, Hypokalemia (04/11/2013), MRSA carrier (08/30/2014), Obesity, Osteoarthritis, Pulmonary embolism ([place]) (06/2020), Scoliosis (2016), Spinal stenosis of lumbar region, and Urinary tract infection (04/11/2013).  has a past surgical history that includes Tonsillectomy; Lumbar laminectomy; Nasal polyp surgery; Esophagogastroduodenoscopy (N/A, 08/29/2014); Colonoscopy (2008); Laminectomy (N/A, 07/28/2018); and ORIF ankle fracture (Right, 07/01/2020). Prior to Admission medications   Medication Sig Start Date End Date Taking? Authorizing Provider  apixaban (ELIQUIS) 5 MG TABS tablet Take 1 tablet (5 mg total) by mouth 2 (two) times daily. Patient not taking: Reported on 11/19/2020 07/19/20   Angiulli, Lavon Paganini, PA-C  atenolol-chlorthalidone (TENORETIC) 50-25 MG tablet Take 1 tablet by mouth daily. Pt needs appt for further refills. 07/19/20   Angiulli, Lavon Paganini, PA-C  cloNIDine (CATAPRES) 0.1 MG tablet Take 1 tablet (0.1 mg total) by mouth daily. 07/19/20   Angiulli, Lavon Paganini, PA-C  docusate sodium (COLACE) 100 MG capsule Take 1 capsule (100 mg total) by mouth 2 (two) times daily. 06/04/20   Viona Gilmore D, NP  DULoxetine (CYMBALTA) 30 MG capsule Take 2 capsules (60 mg total) by mouth daily. 07/19/20   Angiulli, Lavon Paganini, PA-C  metFORMIN (GLUCOPHAGE) 500 MG tablet Take 0.5 tablets (250 mg total) by mouth daily with breakfast. 07/19/20   Angiulli, Lavon Paganini, PA-C  Multiple Vitamin (MULITIVITAMIN WITH MINERALS) TABS Take 1 tablet by mouth daily.    [provider]  oxyCODONE (OXY IR/ROXICODONE) 5 MG immediate release tablet Take 1-2 tablets (5-10 mg total) by mouth every 6 (six) hours as needed for severe  pain ((score 7 to 10)). 07/19/20   Angiulli, Lavon Paganini, PA-C  oxyCODONE-acetaminophen (PERCOCET) 7.5-325 MG tablet Take 1 tablet by mouth every 6 (six) hours as needed. 10/20/20   [provider]  polyethylene  glycol (MIRALAX / GLYCOLAX) 17 g packet Take 17 g by mouth 2 (two) times daily. 07/19/20   Angiulli, Lavon Paganini, PA-C  potassium chloride SA (KLOR-CON) 20 MEQ tablet Take 1 tablet (20 mEq total) by mouth daily. 07/19/20   Angiulli, Lavon Paganini, PA-C  senna-docusate (SENOKOT-S) 8.6-50 MG tablet Take 2 tablets by mouth 2 (two) times daily. 07/19/20   Angiulli, Lavon Paganini, PA-C  simvastatin (ZOCOR) 40 MG tablet Take 1 tablet (40 mg total) by mouth daily. 07/19/20   Angiulli, Lavon Paganini, PA-C   Allergies  Allergen Reactions  . Aspirin     Irritates the stomach ulcers, also  . Lisinopril Other (See Comments)    Caused a body ache    FAMILY HISTORY:  family history includes Asthma in his mother; Diabetes in his mother; Hypertension in his father; Stroke in his father. SOCIAL HISTORY:  reports that he quit smoking about 27 years ago. He has a 25.00 pack-year smoking history. He has never used smokeless tobacco. He reports current alcohol use. He reports that he does not use drugs.  REVIEW OF SYSTEMS:   10 point review of system taken, please see HPI for positives and negatives.   SUBJECTIVE:   VITAL SIGNS: Temp:  [98.7 F (37.1 C)-99 F (37.2 C)] 99 F (37.2 C) (02/12 0733) Pulse Rate:  [82-88] 87 (02/12 0733) Resp:  [16-25] 25 (02/12 0733) BP: (119-136)/(72-79) 119/77 (02/12 0733) SpO2:  [94 %-98 %] 94 % (02/12 0733) Weight:  [122.5 kg] 122.5 kg (02/12 0313)  PHYSICAL EXAMINATION: General: Obese male no acute distress Neuro: Grossly intact without focal defect HEENT: No JVD or lymphadenopathy is appreciated no obvious blood in the oropharynx Cardiovascular: Heart sounds are regular Lungs: Decreased breath sounds throughout Abdomen: Obese soft nontender positive bowel sounds Musculoskeletal: Pain and tenderness to touch left lateral chest area.  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 Pain. Shallow inspiration. EXAM: CT ANGIOGRAPHY CHEST WITH CONTRAST TECHNIQUE: Multidetector CT imaging of the chest was performed using the standard protocol during bolus administration of intravenous contrast. Multiplanar CT image reconstructions and MIPs were obtained to evaluate the vascular anatomy. CONTRAST:  147mL OMNIPAQUE IOHEXOL 350 MG/ML SOLN COMPARISON:  Portable chest 0436 hours today. CTA chest 06/26/2020. 01/19/2012 CT Abdomen and Pelvis FINDINGS: Cardiovascular: Adequate contrast bolus timing in the pulmonary arterial tree. Respiratory motion, respiratory motion, pronounced at the lung bases and also the right hilum. No central pulmonary thrombus, but in segmental branches of the right upper lobe (series 6, image 101) and both lower lobes (series 6, image 154 and 161) there  is suggestion of small volume nonocclusive clot. However, saddle and hilar embolus was demonstrated in September. Therefore, this is likely the residual of previous pulmonary embolus. Stable cardiac size at the upper limits of normal. No pericardial effusion. Negative visible aorta aside from tortuosity and minimal plaque. Mediastinum/Nodes: Negative.  No mediastinal hematoma. Lungs/Pleura: Mostly loculated, minimally layering left pleural effusion with mildly hyperdense fluid loculated in the left lateral chest, but low-density appearing fluid elsewhere. Some fluid trapped in the major fissure. Superimposed confluent left lower lobe heterogeneous consolidation, where  the left lower lobe was clear in September. Left major airways remain patent. No left pneumothorax. Right lung is negative aside from mild atelectasis. Upper Abdomen: Negative visible liver, gallbladder, spleen, pancreas, and bowel. Stable left adrenal thickening/nodularity greater on the left. Density there is fairly low (12 Hounsfield units, and some macroscopic fat seems to be visible (series 5, image 85) and furthermore this has not significantly changed since CT Abdomen and Pelvis 01/19/2012, consistent with benign adenoma. Stable visible kidneys, benign-appearing left upper pole cyst. Musculoskeletal: No left-side rib fracture is identified. There is severe degeneration in the spine with S-shaped thoracolumbar scoliosis and chronic thoracolumbar junction laminectomy changes. Stable visualized osseous structures. Review of the MIP images confirms the above findings. IMPRESSION: 1. 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 2. Mixed loculated > layering left Pleural Effusion. Much of the fluid has low-density, but Hemothorax is difficult to exclude in the setting of trauma. However, no overlying rib fracture is identified. 3. Superimposed left lower lobe consolidation more suggestive of Pneumonia than Pulmonary Contusion. Pulmonary infarct persisting since September is unlikely. 4. Stable benign adrenal thickening, left adrenal adenoma. Electronically Signed   By: Genevie Ann M.D.   On: 11/19/2020 05:49   DG Chest Port 1 View  Result Date: 11/19/2020 CLINICAL DATA:  Chest pain post fall. Fall yesterday from car. Left-sided pain radiating to the back. EXAM: PORTABLE CHEST 1 VIEW COMPARISON:  Radiograph and CT 06/26/2020 FINDINGS: Low lung volumes. Upper normal heart size, unchanged from prior exam. Aortic tortuosity is unchanged. Left pleural effusion which partially tracks laterally. Patchy retrocardiac  opacity. No visualized pneumothorax. Right lung is clear. Scoliotic curvature of the spine. No visualized left rib fractures. IMPRESSION: Left pleural effusion with patchy retrocardiac opacity, may be contusion in the setting of injury. No visualized pneumothorax or left rib fractures. Electronically Signed   By: Keith Rake M.D.   On: 11/19/2020 03:53   Discussion: 70 year old male with a well-documented past medical history who was in his usual state of medium health he is walker bound due to injuries to his left leg and back pain he has been on anticoagulants in the past but not currently taking any 5 days ago he started having fevers chills purulent sputum from cough.  He noted he was so uncomfortable he felt lightheaded.  2 days ago he fell hit the car door with the left side of his chest and in the posterior is noted excruciating pain since then.  He notes hemoptysis that started prior to the injury.  He was seen in the emergency room 11/19/2020 CT scan revealed a loculated left effusion and pulmonary critical care was called for chest tube insertion.  After reviewing the CT scan Dr. Carlis Abbott is asked interventional radiology to place the chest tube due to the positioning of the effusion posterior to the scapula.  Interventional radiology is aware.  Currently  Mr. Chalfin is in no distress he is awake alert saturating well.  He has been fully vaccinated against Covid and is Covid testing is negative at this time.  Pulmonary critical care will continue to follow along should he need lytic therapy for his effusions as it is loculated.   ASSESSMENT: Principal Problem: Loculated left effusion Hemoptysis Status post fall w8ith left chest involvement    CAP (community acquired pneumonia) Active Problems:   Class 2 obesity due to excess calories with body mass index (BMI) of 36.0 to 36.9 in adult   Essential hypertension   AKI (acute kidney injury) ([place])   Hyperlipidemia associated with type 2  diabetes mellitus ([facility])   Type 2 diabetes mellitus with diabetic neuropathy, unspecified ([facility])   Chronic pain syndrome   Pleural effusion   DNR (do not resuscitate)     PLAN: Chest tube insertion per interventional radiology Possible lytic therapy Admit to the floor and to the [facility] hospitalist service Pulmonary critical care will continue to follow along   Richardson Landry Renna Kilmer ACNP Acute Care Nurse Practitioner [place] Please consult Amion 11/19/2020, 10:24 AM

## 2020-11-20 ENCOUNTER — Other Ambulatory Visit: Payer: Self-pay

## 2020-11-20 ENCOUNTER — Inpatient Hospital Stay (HOSPITAL_COMMUNITY): Payer: Medicare Other

## 2020-11-20 DIAGNOSIS — J9601 Acute respiratory failure with hypoxia: Secondary | ICD-10-CM

## 2020-11-20 DIAGNOSIS — J189 Pneumonia, unspecified organism: Secondary | ICD-10-CM | POA: Diagnosis not present

## 2020-11-20 DIAGNOSIS — J90 Pleural effusion, not elsewhere classified: Secondary | ICD-10-CM | POA: Diagnosis not present

## 2020-11-20 DIAGNOSIS — Z9689 Presence of other specified functional implants: Secondary | ICD-10-CM

## 2020-11-20 DIAGNOSIS — J13 Pneumonia due to Streptococcus pneumoniae: Secondary | ICD-10-CM | POA: Diagnosis not present

## 2020-11-20 LAB — CBC WITH DIFFERENTIAL/PLATELET
Abs Immature Granulocytes: 0.1 10*3/uL — ABNORMAL HIGH (ref 0.00–0.07)
Basophils Absolute: 0 10*3/uL (ref 0.0–0.1)
Basophils Relative: 0 %
Eosinophils Absolute: 0 10*3/uL (ref 0.0–0.5)
Eosinophils Relative: 0 %
HCT: 38.3 % — ABNORMAL LOW (ref 39.0–52.0)
Hemoglobin: 12.4 g/dL — ABNORMAL LOW (ref 13.0–17.0)
Immature Granulocytes: 1 %
Lymphocytes Relative: 4 %
Lymphs Abs: 0.7 10*3/uL (ref 0.7–4.0)
MCH: 28.5 pg (ref 26.0–34.0)
MCHC: 32.4 g/dL (ref 30.0–36.0)
MCV: 88 fL (ref 80.0–100.0)
Monocytes Absolute: 2.4 10*3/uL — ABNORMAL HIGH (ref 0.1–1.0)
Monocytes Relative: 13 %
Neutro Abs: 14.7 10*3/uL — ABNORMAL HIGH (ref 1.7–7.7)
Neutrophils Relative %: 82 %
Platelets: 460 10*3/uL — ABNORMAL HIGH (ref 150–400)
RBC: 4.35 MIL/uL (ref 4.22–5.81)
RDW: 15.4 % (ref 11.5–15.5)
WBC: 17.9 10*3/uL — ABNORMAL HIGH (ref 4.0–10.5)
nRBC: 0 % (ref 0.0–0.2)

## 2020-11-20 LAB — BASIC METABOLIC PANEL WITH GFR
Anion gap: 11 (ref 5–15)
BUN: 16 mg/dL (ref 8–23)
CO2: 26 mmol/L (ref 22–32)
Calcium: 9.5 mg/dL (ref 8.9–10.3)
Chloride: 101 mmol/L (ref 98–111)
Creatinine, Ser: 1.21 mg/dL (ref 0.61–1.24)
GFR, Estimated: >60 mL/min
Glucose, Bld: 177 mg/dL — ABNORMAL HIGH (ref 70–99)
Potassium: 3.1 mmol/L — ABNORMAL LOW (ref 3.5–5.1)
Sodium: 138 mmol/L (ref 135–145)

## 2020-11-20 LAB — GLUCOSE, CAPILLARY
Glucose-Capillary: 141 mg/dL — ABNORMAL HIGH (ref 70–99)
Glucose-Capillary: 121 mg/dL — ABNORMAL HIGH (ref 70–99)
Glucose-Capillary: 123 mg/dL — ABNORMAL HIGH (ref 70–99)
Glucose-Capillary: 163 mg/dL — ABNORMAL HIGH (ref 70–99)

## 2020-11-20 MED ORDER — POTASSIUM CHLORIDE CRYS ER 20 MEQ PO TBCR
40.0000 meq | EXTENDED_RELEASE_TABLET | Freq: Once | ORAL | Status: AC
  Administered 2020-11-20: 40 meq via ORAL
  Filled 2020-11-20: qty 2

## 2020-11-20 NOTE — Progress Notes (Signed)
[facility]                                                                              Patient Demographics  Keith [name], is a 70 y.o. male, DOB - 06-27-1951, FBP:[id number]  Admit date - 11/19/2020   Admitting Physician Eben Burow, MD  Outpatient Primary MD for the patient is [name], [name], MD  Outpatient specialists:   LOS - 1  days   Medical records reviewed and are as summarized below:    chief complaint : Pleuritic chest pain      Brief summary   Patient is a 70 year old male with history of hypertension, diabetes mellitus, chronic back pain, PE in 06/2020, has completed anticoagulation, ambulates with a walker presented with cough, chest pain, hemoptysis and fall. Per patient it started 5 days ago when he went out without a coat. When he returned, he started having cold-like symptoms, then fevers chills, purulent sputum from cough. On 11/18/2020, he fell and hit the car door with the left side of the chest and posterior, since then having excruciating pain. He also noticed hemoptysis that started prior to the injury.  On admission, CT chest showed loculated left-sided effusion. CCM was consulted and patient was admitted for further work-up. Patient is fully vaccinated for Covid   Assessment & Plan    Principal Problem:   CAP (community acquired pneumonia) with loculated left-sided effusion -Presented with productive cough, fevers, hemoptysis, infiltrates in the left lower lobe with loculated effusion overlying consolidation -CCM, IR consulted. Continue IV Zithromax, Rocephin -Underwent CT-guided left chest tube placement on 2/12 by IR with first dose lytics given by Dr. Carlis Abbott -Continue management per CCM, IR, serial chest x-ray, plan for tomorrow 2 doses of lytics on 2/13 and 2/14 -Continue pain control, incentive spirometry  Active Problems: Acute respiratory failure with hypoxia secondary to #1 -O2 sats 97% on 2 L O2 via nasal  cannula -Wean O2 as tolerated, continue chest tube     AKI (acute kidney injury) ([place]) -Creatinine 1.48 on admission, baseline 1.1 -Patient was placed on gentle hydration, improving, 1.2 today  Hypokalemia Replaced    Hyperlipidemia associated with type 2 diabetes mellitus ([place]) -Continue Zocor     Type 2 diabetes mellitus with diabetic neuropathy, unspecified (H[facility]) - hold oral hypoglycemics -Continue moderate sliding scale insulin, follow hemoglobin A1c    Chronic pain syndrome, chronic back pain, now with recent fall, left-sided rib contusion -Continue pain control, IV morphine, oxycodone IR, Cymbalta -Continue Lidoderm patch   History of PE -No longer on Eliquis, no apparent PE on CTA  Essential hypertension -BP currently stable, continue atenolol, chlorthalidone, Catapres  Obesity class II Estimated body mass index is 36.62 kg/m as calculated from the following:   Height as of this encounter: 6' (1.829 m).   Weight as of this encounter: 122.5 kg.     Code Status: DNR DVT Prophylaxis:  SCDs Start: 11/19/20 0833   Level of Care: Level of care: Telemetry Medical Family Communication: Discussed all imaging results, lab results, explained to the patient   Disposition Plan:     Status is: Inpatient  Remains inpatient  appropriate because:Inpatient level of care appropriate due to severity of illness   Dispo: The patient is from: Home              Anticipated d/c is to: Home              Anticipated d/c date is: > 3 days              Patient currently is not medically stable to d/c. Chest tube placed   Difficult to place patient No      Time Spent in minutes   35 minutes Procedures:  CT-guided chest tube placement on 11/19/2020 by IR followed by fibrinolysis of the complicated pleural effusion by Dr. Carlis Abbott  Consultants:   Pulmonology IR  Antimicrobials:   Anti-infectives (From admission, onward)   Start     Dose/Rate Route Frequency Ordered Stop    11/19/20 1300  cefTRIAXone (ROCEPHIN) 2 g in sodium chloride 0.9 % 100 mL IVPB        2 g 200 mL/hr over 30 Minutes Intravenous Every 24 hours 11/19/20 0837 11/24/20 1259   11/19/20 1300  azithromycin (ZITHROMAX) 500 mg in sodium chloride 0.9 % 250 mL IVPB        500 mg 250 mL/hr over 60 Minutes Intravenous Every 24 hours 11/19/20 0837 11/24/20 1259   11/19/20 0615  vancomycin (VANCOREADY) IVPB 2000 mg/400 mL        2,000 mg 200 mL/hr over 120 Minutes Intravenous  Once 11/19/20 0600 11/19/20 1004   11/19/20 0615  piperacillin-tazobactam (ZOSYN) IVPB 3.375 g        3.375 g 100 mL/hr over 30 Minutes Intravenous  Once 11/19/20 0600 11/19/20 0727          Medications  Scheduled Meds: . alteplase (TPA) for intrapleural administration  10 mg Intrapleural Q1400   And  . pulmozyme (DORNASE) for intrapleural administration  5 mg Intrapleural Q1400  . atenolol  50 mg Oral Daily   And  . chlorthalidone  25 mg Oral Daily  . cloNIDine  0.1 mg Oral Daily  . DULoxetine  60 mg Oral Daily  . insulin aspart  0-15 Units Subcutaneous TID WC  . insulin aspart  0-5 Units Subcutaneous QHS  . lidocaine  1 patch Transdermal Q24H  . multivitamin with minerals  1 tablet Oral Daily  . potassium chloride SA  20 mEq Oral Daily  . senna-docusate  2 tablet Oral BID  . simvastatin  40 mg Oral Daily   Continuous Infusions: . sodium chloride 75 mL/hr at 11/20/20 0700  . azithromycin Stopped (11/19/20 1620)  . cefTRIAXone (ROCEPHIN)  IV Stopped (11/19/20 1508)   PRN Meds:.acetaminophen, alum & mag hydroxide-simeth, guaiFENesin-codeine, HYDROmorphone (DILAUDID) injection, morphine injection, ondansetron (ZOFRAN) IV, oxyCODONE      Subjective:   Keith [name] was seen and examined today. Pain currently 5/10, left side of the chest from rib contusion and chest tube. No active nausea vomiting or hemoptysis. No abdominal pain, nausea vomiting or diarrhea. No fevers.  Objective:   Vitals:   11/20/20  0034 11/20/20 0114 11/20/20 0630 11/20/20 0956  BP:  117/74 124/84 120/69  Pulse:  93 95 89  Resp:  20 18 19   Temp: 99.6 F (37.6 C) 98.4 F (36.9 C) 98.9 F (37.2 C) 98.8 F (37.1 C)  TempSrc: Oral Oral Oral Oral  SpO2:  99% 97% 99%  Weight:      Height:        Intake/Output Summary (Last 24 hours)  at 11/20/2020 1030 Last data filed at 11/20/2020 0700 Gross per 24 hour  Intake 1762.67 ml  Output 1630 ml  Net 132.67 ml     Wt Readings from Last 3 Encounters:  11/19/20 122.5 kg  07/04/20 120.1 kg  07/03/20 123.3 kg     Exam  General: Alert and oriented x 3, NAD  Cardiovascular: S1 S2 auscultated, no murmurs, RRR  Respiratory: Breath sounds diminished on the left, + chest tube  Gastrointestinal: Soft, nontender, nondistended, + bowel sounds  Ext: no pedal edema bilaterally  Neuro:. Strength 5/5 upper and lower extremities bilaterally  Musculoskeletal: No digital cyanosis, clubbing  Skin: No rashes  Psych: Normal affect and demeanor, alert and oriented x3    Data Reviewed:  I have personally reviewed following labs and imaging studies  Micro Results Recent Results (from the past 240 hour(s))  Resp Panel by RT-PCR (Flu A&B, Covid) Nasopharyngeal Swab     Status: None   Collection Time: 11/19/20  8:53 AM   Specimen: Nasopharyngeal Swab; Nasopharyngeal(NP) swabs in vial transport medium  Result Value Ref Range Status   SARS Coronavirus 2 by RT PCR NEGATIVE NEGATIVE Final    Comment: (NOTE) SARS-CoV-2 target nucleic acids are NOT DETECTED.  The SARS-CoV-2 RNA is generally detectable in upper respiratory specimens during the acute phase of infection. The lowest concentration of SARS-CoV-2 viral copies this assay can detect is 138 copies/mL. A negative result does not preclude SARS-Cov-2 infection and should not be used as the sole basis for treatment or other patient management decisions. A negative result may occur with  improper specimen  collection/handling, submission of specimen other than nasopharyngeal swab, presence of viral mutation(s) within the areas targeted by this assay, and inadequate number of viral copies(<138 copies/mL). A negative result must be combined with clinical observations, patient history, and epidemiological information. The expected result is Negative.  Fact Sheet for Patients:  EntrepreneurPulse.com.au  Fact Sheet for Healthcare Providers:  IncredibleEmployment.be  This test is no t yet approved or cleared by the [place] FDA and  has been authorized for detection and/or diagnosis of SARS-CoV-2 by FDA under an Emergency Use Authorization (EUA). This EUA will remain  in effect (meaning this test can be used) for the duration of the COVID-19 declaration under Section 564(b)(1) of the Act, 21 U.S.C.section 360bbb-3(b)(1), unless the authorization is terminated  or revoked sooner.       Influenza A by PCR NEGATIVE NEGATIVE Final   Influenza B by PCR NEGATIVE NEGATIVE Final    Comment: (NOTE) The Xpert Xpress SARS-CoV-2/FLU/RSV plus assay is intended as an aid in the diagnosis of influenza from Nasopharyngeal swab specimens and should not be used as a sole basis for treatment. Nasal washings and aspirates are unacceptable for Xpert Xpress SARS-CoV-2/FLU/RSV testing.  Fact Sheet for Patients: EntrepreneurPulse.com.au  Fact Sheet for Healthcare Providers: IncredibleEmployment.be  This test is not yet approved or cleared by the [place] FDA and has been authorized for detection and/or diagnosis of SARS-CoV-2 by FDA under an Emergency Use Authorization (EUA). This EUA will remain in effect (meaning this test can be used) for the duration of the COVID-19 declaration under Section 564(b)(1) of the Act, 21 U.S.C. section 360bbb-3(b)(1), unless the authorization is terminated or revoked.  Performed at [facility], [place] [address] [place], [place] [address]   Culture, sputum-assessment     Status: None   Collection Time: 11/19/20 12:24 PM   Specimen: Sputum  Result Value Ref Range Status  Specimen Description EXPSU  Final   Special Requests Normal  Final   Sputum evaluation   Final    THIS SPECIMEN IS ACCEPTABLE FOR SPUTUM CULTURE Performed at [facility], [place] [address] [place], [place] [address]    Report Status 11/19/2020 FINAL  Final  Culture, respiratory     Status: None (Preliminary result)   Collection Time: 11/19/20 12:24 PM  Result Value Ref Range Status   Specimen Description EXPSU  Final   Special Requests Normal Reflexed from [id number]  Final   Gram Stain   Final    ABUNDANT WBC PRESENT, PREDOMINANTLY PMN RARE SQUAMOUS EPITHELIAL CELLS PRESENT MODERATE GRAM NEGATIVE RODS FEW GRAM POSITIVE COCCI IN PAIRS Performed at [facility], [place] [address], [place], [place] [address]    Culture PENDING  Incomplete   Report Status PENDING  Incomplete  Body fluid culture     Status: None (Preliminary result)   Collection Time: 11/19/20  1:33 PM   Specimen: Pleura  Result Value Ref Range Status   Specimen Description PLEURAL FLUID  Final   Special Requests LEFT  Final   Gram Stain   Final    ABUNDANT WBC PRESENT, PREDOMINANTLY PMN NO ORGANISMS SEEN Performed at [facility], [place] [address] [place], [place] [address]    Culture PENDING  Incomplete   Report Status PENDING  Incomplete    Radiology Reports CT Angio Chest PE W and/or Wo Contrast  Result Date: 11/19/2020 CLINICAL DATA:  70 year old male status post fall yesterday striking left ribs again scar. Pain. Shallow inspiration. EXAM: CT ANGIOGRAPHY CHEST WITH CONTRAST TECHNIQUE: Multidetector CT imaging of the chest was performed using the standard protocol during bolus administration of intravenous contrast. Multiplanar CT image reconstructions and MIPs were obtained to evaluate the vascular  anatomy. CONTRAST:  156mL OMNIPAQUE IOHEXOL 350 MG/ML SOLN COMPARISON:  Portable chest 0436 hours today. CTA chest 06/26/2020. 01/19/2012 CT Abdomen and Pelvis FINDINGS: Cardiovascular: Adequate contrast bolus timing in the pulmonary arterial tree. Respiratory motion, respiratory motion, pronounced at the lung bases and also the right hilum. No central pulmonary thrombus, but in segmental branches of the right upper lobe (series 6, image 101) and both lower lobes (series 6, image 154 and 161) there is suggestion of small volume nonocclusive clot. However, saddle and hilar embolus was demonstrated in September. Therefore, this is likely the residual of previous pulmonary embolus. Stable cardiac size at the upper limits of normal. No pericardial effusion. Negative visible aorta aside from tortuosity and minimal plaque. Mediastinum/Nodes: Negative.  No mediastinal hematoma. Lungs/Pleura: Mostly loculated, minimally layering left pleural effusion with mildly hyperdense fluid loculated in the left lateral chest, but low-density appearing fluid elsewhere. Some fluid trapped in the major fissure. Superimposed confluent left lower lobe heterogeneous consolidation, where the left lower lobe was clear in September. Left major airways remain patent. No left pneumothorax. Right lung is negative aside from mild atelectasis. Upper Abdomen: Negative visible liver, gallbladder, spleen, pancreas, and bowel. Stable left adrenal thickening/nodularity greater on the left. Density there is fairly low (12 Hounsfield units, and some macroscopic fat seems to be visible (series 5, image 85) and furthermore this has not significantly changed since CT Abdomen and Pelvis 01/19/2012, consistent with benign adenoma. Stable visible kidneys, benign-appearing left upper pole cyst. Musculoskeletal: No left-side rib fracture is identified. There is severe degeneration in the spine with S-shaped thoracolumbar scoliosis and chronic thoracolumbar  junction laminectomy changes. Stable visualized osseous structures. Review of the MIP images  confirms the above findings. IMPRESSION: 1. 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 2. Mixed loculated > layering left Pleural Effusion. Much of the fluid has low-density, but Hemothorax is difficult to exclude in the setting of trauma. However, no overlying rib fracture is identified. 3. Superimposed left lower lobe consolidation more suggestive of Pneumonia than Pulmonary Contusion. Pulmonary infarct persisting since September is unlikely. 4. Stable benign adrenal thickening, left adrenal adenoma. Electronically Signed   By: Genevie Ann M.D.   On: 11/19/2020 05:49   DG CHEST PORT 1 VIEW  Result Date: 11/20/2020 CLINICAL DATA:  71 year old male with recent fall, left pleural effusion and airspace disease. Status post CT-guided left chest tube placement yesterday. EXAM: PORTABLE CHEST 1 VIEW COMPARISON:  CTA chest 11/19/2020 and earlier. FINDINGS: Portable AP semi upright view at 0419 hours. Pigtail left chest tube now in place. Skin fold artifacts suspected in the left chest. No significant pneumothorax although there may be a small focus of loculated pleural gas laterally (arrow). Continued low lung volumes. Patchy and confluent left lung base opacity persists. Stable cardiac size and mediastinal contours. No overt edema. Scoliosis. Stable visualized osseous structures. Negative visible bowel gas pattern. IMPRESSION: 1. Left chest tube in place with no significant pneumothorax; trace loculated pleural gas laterally. 2. Continued low lung volumes and left lung base opacity. Electronically Signed   By: Genevie Ann M.D.   On: 11/20/2020 07:00   DG Chest Port 1 View  Result Date: 11/19/2020 CLINICAL DATA:  Chest pain post fall. Fall yesterday from car. Left-sided pain radiating to the back. EXAM: PORTABLE CHEST 1 VIEW  COMPARISON:  Radiograph and CT 06/26/2020 FINDINGS: Low lung volumes. Upper normal heart size, unchanged from prior exam. Aortic tortuosity is unchanged. Left pleural effusion which partially tracks laterally. Patchy retrocardiac opacity. No visualized pneumothorax. Right lung is clear. Scoliotic curvature of the spine. No visualized left rib fractures. IMPRESSION: Left pleural effusion with patchy retrocardiac opacity, may be contusion in the setting of injury. No visualized pneumothorax or left rib fractures. Electronically Signed   By: Keith Rake M.D.   On: 11/19/2020 03:53   CT IMAGE GUIDED DRAINAGE BY PERCUTANEOUS CATHETER  Result Date: 11/19/2020 INDICATION: Left loculated effusion EXAM: CT-GUIDED POSTERIOR LEFT 10 FRENCH CHEST TUBE PLACEMENT MEDICATIONS: The patient is currently admitted to the hospital and receiving intravenous antibiotics. The antibiotics were administered within an appropriate time frame prior to the initiation of the procedure. ANESTHESIA/SEDATION: Fentanyl 50 mcg IV; Versed 1.0 mg IV Moderate Sedation Time:  11 MINUTES The patient was continuously monitored during the procedure by the interventional radiology nurse under my direct supervision. COMPLICATIONS: None immediate. PROCEDURE: Informed written consent was obtained from the patient after a thorough discussion of the procedural risks, benefits and alternatives. All questions were addressed. Maximal Sterile Barrier Technique was utilized including caps, mask, sterile gowns, sterile gloves, sterile drape, hand hygiene and skin antiseptic. A timeout was performed prior to the initiation of the procedure. previous imaging reviewed. patient position right side down decubitus. noncontrast localization ct performed. the left complex loculated effusion was localized and marked for a posterolateral approach. under sterile conditions and local anesthesia, an 18 gauge introducer needle was advanced from a lateral intercostal  approach into the effusion. needle position confirmed with ct. syringe aspiration yielded serous pleural fluid. sample sent for culture. guidewire inserted followed by tract dilatation to insert a 10 french drain. drain catheter position  confirmed with ct. catheter secured with prolene suture and connected to external pleura vac. sterile dressing applied. no immediate complication. patient tolerated the procedure well. IMPRESSION: Successful CT-guided 10 French left chest tube insertion Electronically Signed   By: Jerilynn Mages.  Shick M.D.   On: 11/19/2020 13:52    Lab Data:  CBC: Recent Labs  Lab 11/19/20 0325 11/19/20 0419 11/20/20 0122  WBC 10.4  --  17.9*  NEUTROABS 8.4*  --  14.7*  HGB 12.4* 13.6 12.4*  HCT 39.0 40.0 38.3*  MCV 90.3  --  88.0  PLT 446*  --  160*   Basic Metabolic Panel: Recent Labs  Lab 11/19/20 0325 11/19/20 0419 11/20/20 0122  NA 136 140 138  K 3.3* 3.3* 3.1*  CL 99 101 101  CO2 26  --  26  GLUCOSE 158* 156* 177*  BUN 26* 31* 16  CREATININE 1.48* 1.40* 1.21  CALCIUM 9.9  --  9.5   GFR: Estimated Creatinine Clearance: 79 mL/min (by C-G formula based on SCr of 1.21 mg/dL). Liver Function Tests: No results for input(s): AST, ALT, ALKPHOS, BILITOT, PROT, ALBUMIN in the last 168 hours. No results for input(s): LIPASE, AMYLASE in the last 168 hours. No results for input(s): AMMONIA in the last 168 hours. Coagulation Profile: No results for input(s): INR, PROTIME in the last 168 hours. Cardiac Enzymes: No results for input(s): CKTOTAL, CKMB, CKMBINDEX, TROPONINI in the last 168 hours. BNP (last 3 results) No results for input(s): PROBNP in the last 8760 hours. HbA1C: No results for input(s): HGBA1C in the last 72 hours. CBG: Recent Labs  Lab 11/19/20 1144 11/19/20 1731 11/19/20 2128 11/20/20 0955  GLUCAP 153* 138* 150* 141*   Lipid Profile: No results for input(s): CHOL, HDL, LDLCALC, TRIG, CHOLHDL, LDLDIRECT in the last 72 hours. Thyroid Function  Tests: No results for input(s): TSH, T4TOTAL, FREET4, T3FREE, THYROIDAB in the last 72 hours. Anemia Panel: No results for input(s): VITAMINB12, FOLATE, FERRITIN, TIBC, IRON, RETICCTPCT in the last 72 hours. Urine analysis:    Component Value Date/Time   COLORURINE YELLOW 01/19/2019 1422   APPEARANCEUR HAZY (A) 01/19/2019 1422   LABSPEC 1.020 01/19/2019 1422   PHURINE 5.0 01/19/2019 1422   GLUCOSEU NEGATIVE 01/19/2019 1422   HGBUR MODERATE (A) 01/19/2019 1422   HGBUR negative 10/26/2010 0824   BILIRUBINUR NEGATIVE 01/19/2019 1422   BILIRUBINUR n 01/12/2019 1020   KETONESUR NEGATIVE 01/19/2019 1422   PROTEINUR 100 (A) 01/19/2019 1422   UROBILINOGEN 0.2 01/12/2019 1020   UROBILINOGEN 0.2 04/10/2013 1555   NITRITE NEGATIVE 01/19/2019 1422   LEUKOCYTESUR NEGATIVE 01/19/2019 1422     Etienne Millward M.D. [facility] 11/20/2020, 10:30 AM   Call night coverage person covering after 7pm

## 2020-11-20 NOTE — Progress Notes (Signed)
Received patient from ED accompanied by RN. Patient AOx4, VSS, with left chest tube, on 2 L/min O2 and pain at 9/10 after repositioning patient on bed.  Administered PRN morphine 2mg  Inj per order, oriented pt. On room, bed controls, call light and plan of care.  Gave patient ice water and cup of ice.  Pt. Now resting on bed watching TV.  Will monitor.

## 2020-11-20 NOTE — Progress Notes (Signed)
Name: Keith [name] MRN: [id number] DOB: 1951-01-19    ADMISSION DATE:  11/19/2020 CONSULTATION DATE: 11/19/2018.  REFERRING MD : [facility]  CHIEF COMPLAINT: Dyspnea  BRIEF PATIENT DESCRIPTION: 70 year old obese male with hemoptysis prior to a fall with left chest injury and loculated left fusion  SIGNIFICANT EVENTS  11/18/2020 fall with left chest injury   STUDIES:  11/19/2020 CT of the chest loculated left effusion Procedure: 11/19/2020 left chest tube insertion per interventional radiology 12/17/2020 6:25 PM instillation of lytics in the left chest tube  12/19/2018 2:10 AM instillation of lytics and left chest tube second of 3 doses  HISTORY OF PRESENT ILLNESS:   70 year old male with a well-documented past medical history who was in his usual state of medium health he is walker bound due to injuries to his left leg and back pain he has been on anticoagulants in the past but not currently taking any 5 days ago he started having fevers chills purulent sputum from cough.  He noted he was so uncomfortable he felt lightheaded.  2 days ago he fell hit the car door with the left side of his chest and in the posterior is noted excruciating pain since then.  He notes hemoptysis that started prior to the injury.  He was seen in the emergency room 11/19/2020 CT scan revealed a loculated left effusion and pulmonary critical care was called for chest tube insertion.  After reviewing the CT scan Dr. Carlis Abbott is asked interventional radiology to place the chest tube due to the positioning of the effusion posterior to the scapula.  Interventional radiology is aware.  Currently Keith [name] is in no distress he is awake alert saturating well.  He has been fully vaccinated against Covid and is Covid testing is negative at this time.  Pulmonary critical care will continue to follow along should he need lytic therapy for his effusions as it is loculated.   SUBJECTIVE:  Awake alert no acute distress complains of  pain at chest tube insertion site. VITAL SIGNS: Temp:  [98.4 F (36.9 C)-99.9 F (37.7 C)] 98.9 F (37.2 C) (02/13 0630) Pulse Rate:  [89-109] 95 (02/13 0630) Resp:  [18-39] 18 (02/13 0630) BP: (102-145)/(53-100) 124/84 (02/13 0630) SpO2:  [88 %-99 %] 97 % (02/13 0630)  PHYSICAL EXAMINATION: General: Awake alert no acute distress, complains of pain at chest tube insertion site HEENT: No JVD or lymphadenopathy is appreciated Neuro: Grossly intact without focal defect CV: Heart sounds are regular PULM: Breath sounds are diminished on the left.  Left chest tube in place no air leak approximately 130 cc of fluid in the chamber    GI: soft, bsx4 active bleed GU: Voids Extremities: warm/dry, 1 edema, left foot level of injury Skin: no rashes or lesions   Recent Labs  Lab 11/19/20 0325 11/19/20 0419 11/20/20 0122  NA 136 140 138  K 3.3* 3.3* 3.1*  CL 99 101 101  CO2 26  --  26  BUN 26* 31* 16  CREATININE 1.48* 1.40* 1.21  GLUCOSE 158* 156* 177*   Recent Labs  Lab 11/19/20 0325 11/19/20 0419 11/20/20 0122  HGB 12.4* 13.6 12.4*  HCT 39.0 40.0 38.3*  WBC 10.4  --  17.9*  PLT 446*  --  460*   CT Angio Chest PE W and/or Wo Contrast  Result Date: 11/19/2020 CLINICAL DATA:  70 year old male status post fall yesterday striking left ribs again scar. Pain. Shallow inspiration. EXAM: CT ANGIOGRAPHY CHEST WITH CONTRAST TECHNIQUE: Multidetector CT imaging of  the chest was performed using the standard protocol during bolus administration of intravenous contrast. Multiplanar CT image reconstructions and MIPs were obtained to evaluate the vascular anatomy. CONTRAST:  194mL OMNIPAQUE IOHEXOL 350 MG/ML SOLN COMPARISON:  Portable chest 0436 hours today. CTA chest 06/26/2020. 01/19/2012 CT Abdomen and Pelvis FINDINGS: Cardiovascular: Adequate contrast bolus timing in the pulmonary arterial tree. Respiratory motion, respiratory motion, pronounced at the lung bases and also the right hilum. No  central pulmonary thrombus, but in segmental branches of the right upper lobe (series 6, image 101) and both lower lobes (series 6, image 154 and 161) there is suggestion of small volume nonocclusive clot. However, saddle and hilar embolus was demonstrated in September. Therefore, this is likely the residual of previous pulmonary embolus. Stable cardiac size at the upper limits of normal. No pericardial effusion. Negative visible aorta aside from tortuosity and minimal plaque. Mediastinum/Nodes: Negative.  No mediastinal hematoma. Lungs/Pleura: Mostly loculated, minimally layering left pleural effusion with mildly hyperdense fluid loculated in the left lateral chest, but low-density appearing fluid elsewhere. Some fluid trapped in the major fissure. Superimposed confluent left lower lobe heterogeneous consolidation, where the left lower lobe was clear in September. Left major airways remain patent. No left pneumothorax. Right lung is negative aside from mild atelectasis. Upper Abdomen: Negative visible liver, gallbladder, spleen, pancreas, and bowel. Stable left adrenal thickening/nodularity greater on the left. Density there is fairly low (12 Hounsfield units, and some macroscopic fat seems to be visible (series 5, image 85) and furthermore this has not significantly changed since CT Abdomen and Pelvis 01/19/2012, consistent with benign adenoma. Stable visible kidneys, benign-appearing left upper pole cyst. Musculoskeletal: No left-side rib fracture is identified. There is severe degeneration in the spine with S-shaped thoracolumbar scoliosis and chronic thoracolumbar junction laminectomy changes. Stable visualized osseous structures. Review of the MIP images confirms the above findings. IMPRESSION: 1. 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 2. Mixed loculated > layering left Pleural Effusion. Much of the  fluid has low-density, but Hemothorax is difficult to exclude in the setting of trauma. However, no overlying rib fracture is identified. 3. Superimposed left lower lobe consolidation more suggestive of Pneumonia than Pulmonary Contusion. Pulmonary infarct persisting since September is unlikely. 4. Stable benign adrenal thickening, left adrenal adenoma. Electronically Signed   By: Genevie Ann M.D.   On: 11/19/2020 05:49   DG CHEST PORT 1 VIEW  Result Date: 11/20/2020 CLINICAL DATA:  70 year old male with recent fall, left pleural effusion and airspace disease. Status post CT-guided left chest tube placement yesterday. EXAM: PORTABLE CHEST 1 VIEW COMPARISON:  CTA chest 11/19/2020 and earlier. FINDINGS: Portable AP semi upright view at 0419 hours. Pigtail left chest tube now in place. Skin fold artifacts suspected in the left chest. No significant pneumothorax although there may be a small focus of loculated pleural gas laterally (arrow). Continued low lung volumes. Patchy and confluent left lung base opacity persists. Stable cardiac size and mediastinal contours. No overt edema. Scoliosis. Stable visualized osseous structures. Negative visible bowel gas pattern. IMPRESSION: 1. Left chest tube in place with no significant pneumothorax; trace loculated pleural gas laterally. 2. Continued low lung volumes and left lung base opacity. Electronically Signed   By: Genevie Ann M.D.   On: 11/20/2020 07:00   DG Chest Port 1 View  Result Date: 11/19/2020 CLINICAL DATA:  Chest pain post fall. Fall yesterday from car. Left-sided pain radiating to the  back. EXAM: PORTABLE CHEST 1 VIEW COMPARISON:  Radiograph and CT 06/26/2020 FINDINGS: Low lung volumes. Upper normal heart size, unchanged from prior exam. Aortic tortuosity is unchanged. Left pleural effusion which partially tracks laterally. Patchy retrocardiac opacity. No visualized pneumothorax. Right lung is clear. Scoliotic curvature of the spine. No visualized left rib  fractures. IMPRESSION: Left pleural effusion with patchy retrocardiac opacity, may be contusion in the setting of injury. No visualized pneumothorax or left rib fractures. Electronically Signed   By: Keith Rake M.D.   On: 11/19/2020 03:53   CT IMAGE GUIDED DRAINAGE BY PERCUTANEOUS CATHETER  Result Date: 11/19/2020 INDICATION: Left loculated effusion EXAM: CT-GUIDED POSTERIOR LEFT 10 FRENCH CHEST TUBE PLACEMENT MEDICATIONS: The patient is currently admitted to the hospital and receiving intravenous antibiotics. The antibiotics were administered within an appropriate time frame prior to the initiation of the procedure. ANESTHESIA/SEDATION: Fentanyl 50 mcg IV; Versed 1.0 mg IV Moderate Sedation Time:  11 MINUTES The patient was continuously monitored during the procedure by the interventional radiology nurse under my direct supervision. COMPLICATIONS: None immediate. PROCEDURE: Informed written consent was obtained from the patient after a thorough discussion of the procedural risks, benefits and alternatives. All questions were addressed. Maximal Sterile Barrier Technique was utilized including caps, mask, sterile gowns, sterile gloves, sterile drape, hand hygiene and skin antiseptic. A timeout was performed prior to the initiation of the procedure. previous imaging reviewed. patient position right side down decubitus. noncontrast localization ct performed. the left complex loculated effusion was localized and marked for a posterolateral approach. under sterile conditions and local anesthesia, an 18 gauge introducer needle was advanced from a lateral intercostal approach into the effusion. needle position confirmed with ct. syringe aspiration yielded serous pleural fluid. sample sent for culture. guidewire inserted followed by tract dilatation to insert a 10 french drain. drain catheter position confirmed with ct. catheter secured with prolene suture and connected to external pleura vac. sterile dressing  applied. no immediate complication. patient tolerated the procedure well. IMPRESSION: Successful CT-guided 10 French left chest tube insertion Electronically Signed   By: Jerilynn Mages.  Shick M.D.   On: 11/19/2020 13:52   Discussion: 70 year old male with a well-documented past medical history who was in his usual state of medium health he is walker bound due to injuries to his left leg and back pain he has been on anticoagulants in the past but not currently taking any 5 days ago he started having fevers chills purulent sputum from cough.  He noted he was so uncomfortable he felt lightheaded.  2 days ago he fell hit the car door with the left side of his chest and in the posterior is noted excruciating pain since then.  He notes hemoptysis that started prior to the injury.  He was seen in the emergency room 11/19/2020 CT scan revealed a loculated left effusion and pulmonary critical care was called for chest tube insertion.  After reviewing the CT scan Dr. Carlis Abbott is asked interventional radiology to place the chest tube due to the positioning of the effusion posterior to the scapula.  Interventional radiology is aware.  Currently Keith [name] is in no distress he is awake alert saturating well.  He has been fully vaccinated against Covid and is Covid testing is negative at this time.  Pulmonary critical care will continue to follow along should he need lytic therapy for his effusions as it is loculated.   ASSESSMENT: Principal Problem: Loculated left effusion Hemoptysis Status post fall w8ith left chest involvement  CAP (community acquired pneumonia) Active Problems:   Class 2 obesity due to excess calories with body mass index (BMI) of 36.0 to 36.9 in adult   Essential hypertension   AKI (acute kidney injury) ([facility])   Hyperlipidemia associated with type 2 diabetes mellitus (HCC)   Type 2 diabetes mellitus with diabetic neuropathy, unspecified (HCC)   Chronic pain syndrome   Pleural effusion   DNR (do not  resuscitate)     PLAN: Chest tube insertion per interventional radiology completed on 11/19/2020 First dose of lytics given by Dr. Carlis Abbott 11/19/2020 at 6:25 PM Repeat 2 more doses of lytics next dose to be given at 10 AM 11/20/2020 and again in 11/21/2018 10 AM Serial chest x-ray Monitor for pain and discomfort Document drainage from chest tube      Richardson Landry Dagmawi Venable ACNP Acute Care Nurse Practitioner [place] Please consult [place] 11/20/2020, 8:49 AM

## 2020-11-20 NOTE — Procedures (Incomplete)
11/20/2020 11:20 AM Procedure note for installation of lytics to left chest tube. Utilizing stopcock Cathflo and dornase inserted into the left chest tube. The patient was supine for the first half of the injection.  And then that was elevated. Tolerated the procedure well. Normal saline inserted prior during it after instillation of lytics. RN instructed to leave stopcock turned off x1 hour and then open to drainage.++++++++++++++++99/Richardson Landry Aaminah Forrester ACNP Acute Care Nurse Practitioner [place] Please consult [place] 11/20/2020, 11:17 AM

## 2020-11-20 NOTE — Progress Notes (Signed)
Referring Physician(s): Dr. Renaldo Harrison  Supervising Physician: Daryll Brod  Patient Status:  [facility] - In-pt  Chief Complaint:  Hemoptysis with left sided chest pain s/p left sided chest tube placement by Dr. Vernia Buff  Subjective:  I am feeling better. My breathing is way better but my rooms got moved around last night so I am tired.   Allergies: Aspirin and Lisinopril  Medications: Prior to Admission medications   Medication Sig Start Date End Date Taking? Authorizing Provider  aluminum-magnesium hydroxide-simethicone (MAALOX) [phone] MG/5ML SUSP Take 15-30 mLs by mouth 3 (three) times daily as needed (for heartburn or indigestion).   Yes [provider]  atenolol-chlorthalidone (TENORETIC) 50-25 MG tablet Take 1 tablet by mouth daily. Pt needs appt for further refills. Patient taking differently: Take 1 tablet by mouth daily. 07/19/20  Yes Angiulli, Lavon Paganini, PA-C  cloNIDine (CATAPRES) 0.1 MG tablet Take 1 tablet (0.1 mg total) by mouth daily. Patient taking differently: Take 0.1 mg by mouth in the morning. 07/19/20  Yes Angiulli, Lavon Paganini, PA-C  DULoxetine (CYMBALTA) 30 MG capsule Take 2 capsules (60 mg total) by mouth daily. Patient taking differently: Take 60 mg by mouth in the morning. 07/19/20  Yes Angiulli, Lavon Paganini, PA-C  METAMUCIL FIBER PO Take by mouth See admin instructions. Mix 1 teaspoonful of powder into 6-8 ounces of water and drink once a day as needed for mild constipation   Yes [provider]  metFORMIN (GLUCOPHAGE) 500 MG tablet Take 0.5 tablets (250 mg total) by mouth daily with breakfast. 07/19/20  Yes Angiulli, Lavon Paganini, PA-C  Multiple Vitamin (MULITIVITAMIN WITH MINERALS) TABS Take 1 tablet by mouth daily with breakfast.   Yes [provider]  oxyCODONE-acetaminophen (PERCOCET) 7.5-325 MG tablet Take 2 tablets by mouth See admin instructions. Take 2 (TWO) tablets by mouth in the morning at 7 AM and in the evening at 6 PM 10/20/20   Yes [provider]  simvastatin (ZOCOR) 40 MG tablet Take 1 tablet (40 mg total) by mouth daily. Patient taking differently: Take 40 mg by mouth every evening. 07/19/20  Yes Angiulli, Lavon Paganini, PA-C  apixaban (ELIQUIS) 5 MG TABS tablet Take 1 tablet (5 mg total) by mouth 2 (two) times daily. Patient not taking: No sig reported 07/19/20   Angiulli, Lavon Paganini, PA-C  docusate sodium (COLACE) 100 MG capsule Take 1 capsule (100 mg total) by mouth 2 (two) times daily. Patient not taking: Reported on 11/19/2020 06/04/20   Viona Gilmore D, NP  oxyCODONE (OXY IR/ROXICODONE) 5 MG immediate release tablet Take 1-2 tablets (5-10 mg total) by mouth every 6 (six) hours as needed for severe pain ((score 7 to 10)). Patient not taking: Reported on 11/19/2020 07/19/20   Angiulli, Lavon Paganini, PA-C  polyethylene glycol (MIRALAX / GLYCOLAX) 17 g packet Take 17 g by mouth 2 (two) times daily. Patient not taking: Reported on 11/19/2020 07/19/20   Angiulli, Lavon Paganini, PA-C  potassium chloride SA (KLOR-CON) 20 MEQ tablet Take 1 tablet (20 mEq total) by mouth daily. Patient not taking: Reported on 11/19/2020 07/19/20   Angiulli, Lavon Paganini, PA-C  senna-docusate (SENOKOT-S) 8.6-50 MG tablet Take 2 tablets by mouth 2 (two) times daily. Patient not taking: Reported on 11/19/2020 07/19/20   Cathlyn Parsons, PA-C     Vital Signs: BP 124/84 (BP Location: Right Arm)   Pulse 95   Temp 98.9 F (37.2 C) (Oral)   Resp 18   Ht 6' (1.829 m)   Wt  270 lb (122.5 kg)   SpO2 97%   BMI 36.62 kg/m   Physical Exam Vitals and nursing note reviewed.  Constitutional:      Appearance: He is well-developed and well-nourished.  HENT:     Head: Normocephalic.  Pulmonary:     Effort: Pulmonary effort is normal.     Comments: On O2 via [place].   Left sided chest tube. Site is unremarkable with no erythema, edema, tenderness, bleeding or drainage noted at exit site. Dressing is clean dry and intact. 1100 ml of serosanguinous fluid  noted in the atrium device.    Musculoskeletal:        General: Normal range of motion.     Cervical back: Normal range of motion.  Skin:    General: Skin is dry.  Neurological:     Mental Status: He is alert and oriented to person, place, and time.  Psychiatric:        Mood and Affect: Mood and affect normal.     Imaging: CT Angio Chest PE W and/or Wo Contrast  Result Date: 11/19/2020 CLINICAL DATA:  70 year old male status post fall yesterday striking left ribs again scar. Pain. Shallow inspiration. EXAM: CT ANGIOGRAPHY CHEST WITH CONTRAST TECHNIQUE: Multidetector CT imaging of the chest was performed using the standard protocol during bolus administration of intravenous contrast. Multiplanar CT image reconstructions and MIPs were obtained to evaluate the vascular anatomy. CONTRAST:  161mL OMNIPAQUE IOHEXOL 350 MG/ML SOLN COMPARISON:  Portable chest 0436 hours today. CTA chest 06/26/2020. 01/19/2012 CT Abdomen and Pelvis FINDINGS: Cardiovascular: Adequate contrast bolus timing in the pulmonary arterial tree. Respiratory motion, respiratory motion, pronounced at the lung bases and also the right hilum. No central pulmonary thrombus, but in segmental branches of the right upper lobe (series 6, image 101) and both lower lobes (series 6, image 154 and 161) there is suggestion of small volume nonocclusive clot. However, saddle and hilar embolus was demonstrated in September. Therefore, this is likely the residual of previous pulmonary embolus. Stable cardiac size at the upper limits of normal. No pericardial effusion. Negative visible aorta aside from tortuosity and minimal plaque. Mediastinum/Nodes: Negative.  No mediastinal hematoma. Lungs/Pleura: Mostly loculated, minimally layering left pleural effusion with mildly hyperdense fluid loculated in the left lateral chest, but low-density appearing fluid elsewhere. Some fluid trapped in the major fissure. Superimposed confluent left lower lobe  heterogeneous consolidation, where the left lower lobe was clear in September. Left major airways remain patent. No left pneumothorax. Right lung is negative aside from mild atelectasis. Upper Abdomen: Negative visible liver, gallbladder, spleen, pancreas, and bowel. Stable left adrenal thickening/nodularity greater on the left. Density there is fairly low (12 Hounsfield units, and some macroscopic fat seems to be visible (series 5, image 85) and furthermore this has not significantly changed since CT Abdomen and Pelvis 01/19/2012, consistent with benign adenoma. Stable visible kidneys, benign-appearing left upper pole cyst. Musculoskeletal: No left-side rib fracture is identified. There is severe degeneration in the spine with S-shaped thoracolumbar scoliosis and chronic thoracolumbar junction laminectomy changes. Stable visualized osseous structures. Review of the MIP images confirms the above findings. IMPRESSION: 1. 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 2. Mixed loculated > layering left Pleural Effusion. Much of the fluid has low-density, but Hemothorax is difficult to exclude in the setting of trauma. However, no overlying rib fracture is identified. 3. Superimposed left lower lobe consolidation more suggestive of Pneumonia than  Pulmonary Contusion. Pulmonary infarct persisting since September is unlikely. 4. Stable benign adrenal thickening, left adrenal adenoma. Electronically Signed   By: Genevie Ann M.D.   On: 11/19/2020 05:49   DG CHEST PORT 1 VIEW  Result Date: 11/20/2020 CLINICAL DATA:  70 year old male with recent fall, left pleural effusion and airspace disease. Status post CT-guided left chest tube placement yesterday. EXAM: PORTABLE CHEST 1 VIEW COMPARISON:  CTA chest 11/19/2020 and earlier. FINDINGS: Portable AP semi upright view at 0419 hours. Pigtail left chest tube now in place. Skin  fold artifacts suspected in the left chest. No significant pneumothorax although there may be a small focus of loculated pleural gas laterally (arrow). Continued low lung volumes. Patchy and confluent left lung base opacity persists. Stable cardiac size and mediastinal contours. No overt edema. Scoliosis. Stable visualized osseous structures. Negative visible bowel gas pattern. IMPRESSION: 1. Left chest tube in place with no significant pneumothorax; trace loculated pleural gas laterally. 2. Continued low lung volumes and left lung base opacity. Electronically Signed   By: Genevie Ann M.D.   On: 11/20/2020 07:00   DG Chest Port 1 View  Result Date: 11/19/2020 CLINICAL DATA:  Chest pain post fall. Fall yesterday from car. Left-sided pain radiating to the back. EXAM: PORTABLE CHEST 1 VIEW COMPARISON:  Radiograph and CT 06/26/2020 FINDINGS: Low lung volumes. Upper normal heart size, unchanged from prior exam. Aortic tortuosity is unchanged. Left pleural effusion which partially tracks laterally. Patchy retrocardiac opacity. No visualized pneumothorax. Right lung is clear. Scoliotic curvature of the spine. No visualized left rib fractures. IMPRESSION: Left pleural effusion with patchy retrocardiac opacity, may be contusion in the setting of injury. No visualized pneumothorax or left rib fractures. Electronically Signed   By: Keith Rake M.D.   On: 11/19/2020 03:53   CT IMAGE GUIDED DRAINAGE BY PERCUTANEOUS CATHETER  Result Date: 11/19/2020 INDICATION: Left loculated effusion EXAM: CT-GUIDED POSTERIOR LEFT 10 FRENCH CHEST TUBE PLACEMENT MEDICATIONS: The patient is currently admitted to the hospital and receiving intravenous antibiotics. The antibiotics were administered within an appropriate time frame prior to the initiation of the procedure. ANESTHESIA/SEDATION: Fentanyl 50 mcg IV; Versed 1.0 mg IV Moderate Sedation Time:  11 MINUTES The patient was continuously monitored during the procedure by the  interventional radiology nurse under my direct supervision. COMPLICATIONS: None immediate. PROCEDURE: Informed written consent was obtained from the patient after a thorough discussion of the procedural risks, benefits and alternatives. All questions were addressed. Maximal Sterile Barrier Technique was utilized including caps, mask, sterile gowns, sterile gloves, sterile drape, hand hygiene and skin antiseptic. A timeout was performed prior to the initiation of the procedure. previous imaging reviewed. patient position right side down decubitus. noncontrast localization ct performed. the left complex loculated effusion was localized and marked for a posterolateral approach. under sterile conditions and local anesthesia, an 18 gauge introducer needle was advanced from a lateral intercostal approach into the effusion. needle position confirmed with ct. syringe aspiration yielded serous pleural fluid. sample sent for culture. guidewire inserted followed by tract dilatation to insert a 10 french drain. drain catheter position confirmed with ct. catheter secured with prolene suture and connected to external pleura vac. sterile dressing applied. no immediate complication. patient tolerated the procedure well. IMPRESSION: Successful CT-guided 10 French left chest tube insertion Electronically Signed   By: Jerilynn Mages.  Shick M.D.   On: 11/19/2020 13:52    Labs:  CBC: Recent Labs    07/15/20 0603 07/18/20 3536 11/19/20 0325 11/19/20 0419 11/20/20 0122  WBC 6.9 6.4 10.4  --  17.9*  HGB 10.4* 10.1* 12.4* 13.6 12.4*  HCT 31.5* 32.1* 39.0 40.0 38.3*  PLT 403* 360 446*  --  460*    COAGS: Recent Labs    06/26/20 2001 06/27/20 0101  INR 1.1  --   APTT  --  72*    BMP: Recent Labs    07/01/20 0338 07/03/20 0640 07/05/20 0456 07/11/20 0619 07/13/20 1036 07/19/20 0453 08/01/20 1059 11/19/20 0325 11/19/20 0419 11/20/20 0122  NA 139 137 134* 137 138 139  --  136 140 138  K 3.1* 4.3 4.0 3.2* 3.3* 3.3*  3.5 3.3* 3.3* 3.1*  CL 105 107 102 100 99 102  --  99 101 101  CO2 23 19* 20* 27 27 26   --  26  --  26  GLUCOSE 263* 121* 167* 127* 123* 128*  --  158* 156* 177*  BUN 13 16 12 12 11 11   --  26* 31* 16  CALCIUM 9.6 9.5 9.9 9.7 10.2 9.9  --  9.9  --  9.5  CREATININE 1.16 1.12 1.16 1.10 1.10 1.12  --  1.48* 1.40* 1.21  GFRNONAA >60 >60 >60 >60 >60 >60  --  51*  --  >60  GFRAA >60 >60 >60 >60  --   --   --   --   --   --     LIVER FUNCTION TESTS: Recent Labs    06/29/20 0415 07/01/20 0338 07/03/20 0640 07/05/20 0456  BILITOT 0.8 0.9 0.6 0.6  AST 59* 39 59* 33  ALT 80* 53* 42 32  ALKPHOS 91 87 78 82  PROT 7.2 7.4 6.9 7.4  ALBUMIN 3.6 3.4* 3.2* 3.3*    Assessment and Plan:  70 y.o. male History of PE, DM, Duodenal ulcer with hemorrhage, HTN, anemia. Fell yesterday and has had hemoptysis x several; days and left sided chest pain since a fall that occurred yesterday. Found to have a loculated pleural effusion and superimposed consolidation.  CT angio from 2.12.22 reads Mixed loculated > layering left Pleural Effusion. Much of the fluid has low-density, but Hemothorax is difficult to exclude in the setting of trauma. However, no overlying rib fracture is identified/ On 2.12.22 IR placed a left sided chest tube with aspiration for empyema.    1100 ml of serosanguinous  fluid noted in the atrium device.  WBC 17.9, Potassium 3.1. Gram stain from aspiration shows abundant WBC with no organism. Culture pending.     IR will continue to follow along - plans per PCCM.   Electronically Signed: Jacqualine Mau, NP 11/20/2020, 8:59 AM   I spent a total of 15 Minutes at the patient's bedside AND on the patient's hospital floor or unit, greater than 50% of which was counseling/coordinating care for left sided chest tube.

## 2020-11-21 ENCOUNTER — Inpatient Hospital Stay (HOSPITAL_COMMUNITY): Payer: Medicare Other

## 2020-11-21 DIAGNOSIS — J13 Pneumonia due to Streptococcus pneumoniae: Secondary | ICD-10-CM | POA: Diagnosis not present

## 2020-11-21 DIAGNOSIS — J918 Pleural effusion in other conditions classified elsewhere: Secondary | ICD-10-CM | POA: Diagnosis not present

## 2020-11-21 DIAGNOSIS — J189 Pneumonia, unspecified organism: Secondary | ICD-10-CM | POA: Diagnosis not present

## 2020-11-21 DIAGNOSIS — J90 Pleural effusion, not elsewhere classified: Secondary | ICD-10-CM | POA: Diagnosis not present

## 2020-11-21 LAB — GLUCOSE, CAPILLARY
Glucose-Capillary: 129 mg/dL — ABNORMAL HIGH (ref 70–99)
Glucose-Capillary: 123 mg/dL — ABNORMAL HIGH (ref 70–99)
Glucose-Capillary: 153 mg/dL — ABNORMAL HIGH (ref 70–99)
Glucose-Capillary: 120 mg/dL — ABNORMAL HIGH (ref 70–99)

## 2020-11-21 LAB — CBC
HCT: 36.7 % — ABNORMAL LOW (ref 39.0–52.0)
Hemoglobin: 11.6 g/dL — ABNORMAL LOW (ref 13.0–17.0)
MCH: 29 pg (ref 26.0–34.0)
MCHC: 31.6 g/dL (ref 30.0–36.0)
MCV: 91.8 fL (ref 80.0–100.0)
Platelets: 370 10*3/uL (ref 150–400)
RBC: 4 MIL/uL — ABNORMAL LOW (ref 4.22–5.81)
RDW: 16.2 % — ABNORMAL HIGH (ref 11.5–15.5)
WBC: 14.9 10*3/uL — ABNORMAL HIGH (ref 4.0–10.5)
nRBC: 0 % (ref 0.0–0.2)

## 2020-11-21 LAB — CULTURE, RESPIRATORY W GRAM STAIN
Culture: NORMAL
Special Requests: NORMAL

## 2020-11-21 LAB — BASIC METABOLIC PANEL
Anion gap: 19 — ABNORMAL HIGH (ref 5–15)
BUN: 17 mg/dL (ref 8–23)
CO2: 22 mmol/L (ref 22–32)
Calcium: 9.7 mg/dL (ref 8.9–10.3)
Chloride: 99 mmol/L (ref 98–111)
Creatinine, Ser: 1.22 mg/dL (ref 0.61–1.24)
GFR, Estimated: >60 mL/min (ref >60)
Glucose, Bld: 46 mg/dL — ABNORMAL LOW (ref 70–99)
Potassium: 3.3 mmol/L — ABNORMAL LOW (ref 3.5–5.1)
Sodium: 140 mmol/L (ref 135–145)

## 2020-11-21 LAB — PHOSPHORUS: Phosphorus: 2.5 mg/dL (ref 2.5–4.6)

## 2020-11-21 LAB — HEMOGLOBIN A1C
Hgb A1c MFr Bld: 6.2 % — ABNORMAL HIGH (ref 4.8–5.6)
Mean Plasma Glucose: 131.24 mg/dL

## 2020-11-21 LAB — BODY FLUID CULTURE: Culture: NO GROWTH

## 2020-11-21 LAB — MAGNESIUM: Magnesium: 2.1 mg/dL (ref 1.7–2.4)

## 2020-11-21 MED ORDER — POLYETHYLENE GLYCOL 3350 17 G PO PACK
17.0000 g | PACK | Freq: Every day | ORAL | Status: DC | PRN
  Filled 2020-11-21: qty 1

## 2020-11-21 MED ORDER — DOCUSATE SODIUM 100 MG PO CAPS
100.0000 mg | ORAL_CAPSULE | Freq: Two times a day (BID) | ORAL | Status: DC
  Administered 2020-11-21 – 2020-11-24 (×5): 100 mg via ORAL
  Filled 2020-11-21 (×5): qty 1

## 2020-11-21 MED ORDER — MAGNESIUM CITRATE PO SOLN
1.0000 | Freq: Once | ORAL | Status: AC
  Administered 2020-11-21: 1 via ORAL
  Filled 2020-11-21: qty 296

## 2020-11-21 MED ORDER — POTASSIUM CHLORIDE CRYS ER 20 MEQ PO TBCR
40.0000 meq | EXTENDED_RELEASE_TABLET | Freq: Once | ORAL | Status: AC
  Administered 2020-11-21: 40 meq via ORAL
  Filled 2020-11-21: qty 2

## 2020-11-21 MED ORDER — INSULIN ASPART 100 UNIT/ML ~~LOC~~ SOLN
0.0000 [IU] | Freq: Three times a day (TID) | SUBCUTANEOUS | Status: DC
  Administered 2020-11-21: 2 [IU] via SUBCUTANEOUS
  Administered 2020-11-22 – 2020-11-24 (×3): 1 [IU] via SUBCUTANEOUS

## 2020-11-21 NOTE — Progress Notes (Addendum)
Pleural Fibrinolytic Administration Procedure Note  Keith [name]  [id number]  [birth year]  Date:11/21/20  Time:11:30 AM   Provider Performing:Blair Mesina Alfredo Martinez, NP-C, AGACNP-BC  Procedure: Pleural Fibrinolysis Subsequent day (51898)  Indication(s) Fibrinolysis of complicated pleural effusion  Consent Risks of the procedure as well as the alternatives and risks of each were explained to the patient and/or caregiver.  Consent for the procedure was obtained.   Anesthesia None   Time Out Verified patient identification, verified procedure, site/side was marked, verified correct patient position, special equipment/implants available, medications/allergies/relevant history reviewed, required imaging and test results available.   Sterile Technique Hand hygiene, gloves   Procedure Description Existing pleural catheter was cleaned and accessed in sterile manner.  10mg  of tPA in 30cc of saline and 5mg  of dornase in 30cc of sterile water were injected into pleural space using existing pleural catheter.  Catheter will be clamped for 1 hour and then placed back to suction.   Complications/Tolerance None; patient tolerated the procedure well.  EBL None   Specimen(s) None    Keith Gens, MSN, APRN, NP-C, AGACNP-BC [facility] 11/21/2020, 11:30 AM   Please see [email or web address] for pager details.   From 7A-7P if no response, please call [phone] After hours, please call ELink [phone]

## 2020-11-21 NOTE — Progress Notes (Addendum)
NAME:  Keith [name], MRN:  [id number], DOB:  January 22, 1951, LOS: 2 ADMISSION DATE:  11/19/2020, CONSULTATION DATE:  2/12 REFERRING MD:  [facility], CHIEF COMPLAINT:  Dyspnea    Brief History:  70 to male who presented with cough, hemoptysis, shortness of breath, pleuritic left chest pain found to have loculated pleural effusions.  First dose of TPA and dornase 2/12.   History of Present Illness:  70 year old male with a well-documented past medical history who was in his usual state of medium health he is walker bound due to injuries to his left leg and back pain he has been on anticoagulants in the past but not currently taking any 5 days ago he started having fevers chills purulent sputum from cough.  He noted he was so uncomfortable he felt lightheaded.  2 days ago he fell hit the car door with the left side of his chest and in the posterior is noted excruciating pain since then.  He notes hemoptysis that started prior to the injury.  He was seen in the emergency room 11/19/2020 CT scan revealed a loculated left effusion and pulmonary critical care was called for chest tube insertion.  After reviewing the CT scan Dr. Carlis Abbott is asked interventional radiology to place the chest tube due to the positioning of the effusion posterior to the scapula.  Interventional radiology is aware.  Currently [name] is in no distress he is awake alert saturating well.  He has been fully vaccinated against Covid and is Covid testing is negative at this time.  Pulmonary critical care will continue to follow along should he need lytic therapy for his effusions as it is loculated  Past Medical History:  PE on anticoagulation Hypertension Esophageal stricture Type 2 diabetes Spinal stenosis  Significant Hospital Events:  Admitted 2/12  Consults:  Interventional radiology  Procedures:  CT-guided smallbore biopsy chest tube placed by interventional radiology 2/12  Significant Diagnostic Tests:  CTA chest >  negative for acute PE but positive for nonocclusive chronic PE.  Mixed loculated versus layering left pleural effusion.  Superimposed left lower lobe consolidation more suggestive of pneumonia versus contusion  Micro Data:  COVID 2/12 >  Sputum culture 2/12 > normal Blood cultures 2/12 > Pleural fluid culture 2/12 >  Antimicrobials:  Azithromycin 2/12 > Ceftriaxone 2/12 > Zosyn x1 Vancomycin x1   Interim History / Subjective:  Seen lying in bed with no acute complaints. Verified with patient that hemoptysis began 1 day prior to fall.  He also reports intermittent night sweats at home but denies any recent weight loss or fever  Objective   Blood pressure 123/80, pulse 82, temperature 98.4 F (36.9 C), resp. rate 18, height 6' (1.829 m), weight 122.5 kg, SpO2 98 %.        Intake/Output Summary (Last 24 hours) at 11/21/2020 1158 Last data filed at 11/21/2020 0700 Gross per 24 hour  Intake 1125.82 ml  Output 1920 ml  Net -794.18 ml   Filed Weights   11/19/20 0313  Weight: 122.5 kg    Examination: General: Pleasant 70-year-old male lying in bed in no acute distress HEENT: [place]/AT, MM pink/moist, PERRL,  Neuro: Alert and oriented x3, nonfocal CV: s1s2 regular rate and rhythm, no murmur, rubs, or gallops,  PULM: Clear to auscultation bilaterally, slightly decreased left base, pigtail chest tube remains in place GI: soft, bowel sounds active in all 4 quadrants, non-tender, non-distended Extremities: warm/dry, no edema  Skin: no rashes or lesions  Resolved Hospital Problem list  Assessment & Plan:  Loculated left effusion -Status post small bore chest tube placed by interventional radiology 2/12 -Status post treatment with TPA and dornase Community-acquired pneumonia Status post mechanical fall  Hemoptysis P: Continue chest tube Routine chest tube care  Repeat dosing of TPA/dornase 2/13 Monitor chest tube output Daily chest x-ray Adequate pain Well pulmonary  hygiene mobilize as able Some concern for possible underlying pulmonary mass, may benefit from super D CT to further evaluate now that pleural effusions as he has decreased  PCCM will continue to follow  Best practice (evaluated daily)  Per [facility]   Labs   CBC: Recent Labs  Lab 11/19/20 0325 11/19/20 0419 11/20/20 0122 11/21/20 0200  WBC 10.4  --  17.9* 14.9*  NEUTROABS 8.4*  --  14.7*  --   HGB 12.4* 13.6 12.4* 11.6*  HCT 39.0 40.0 38.3* 36.7*  MCV 90.3  --  88.0 91.8  PLT 446*  --  460* 540    Basic Metabolic Panel: Recent Labs  Lab 11/19/20 0325 11/19/20 0419 11/20/20 0122 11/21/20 0718  NA 136 140 138 140  K 3.3* 3.3* 3.1* 3.3*  CL 99 101 101 99  CO2 26  --  26 22  GLUCOSE 158* 156* 177* 46*  BUN 26* 31* 16 17  CREATININE 1.48* 1.40* 1.21 1.22  CALCIUM 9.9  --  9.5 9.7  MG  --   --   --  2.1  PHOS  --   --   --  2.5   GFR: Estimated Creatinine Clearance: 78.4 mL/min (by C-G formula based on SCr of 1.22 mg/dL). Recent Labs  Lab 11/19/20 0325 11/19/20 1059 11/20/20 0122 11/21/20 0200  PROCALCITON  --  0.14  --   --   WBC 10.4  --  17.9* 14.9*    Liver Function Tests: No results for input(s): AST, ALT, ALKPHOS, BILITOT, PROT, ALBUMIN in the last 168 hours. No results for input(s): LIPASE, AMYLASE in the last 168 hours. No results for input(s): AMMONIA in the last 168 hours.  ABG    Component Value Date/Time   HCO3 23.8 06/27/2020 0419   TCO2 27 11/19/2020 0419   ACIDBASEDEF 2.0 06/27/2020 0419   O2SAT 69.0 06/27/2020 0419     Coagulation Profile: No results for input(s): INR, PROTIME in the last 168 hours.  Cardiac Enzymes: No results for input(s): CKTOTAL, CKMB, CKMBINDEX, TROPONINI in the last 168 hours.  HbA1C: Hgb A1c MFr Bld  Date/Time Value Ref Range Status  11/21/2020 02:00 AM 6.2 (H) 4.8 - 5.6 % Final    Comment:    (NOTE) Pre diabetes:          5.7%-6.4%  Diabetes:              >6.4%  Glycemic control for   <7.0% adults with  diabetes   06/27/2020 08:00 PM 6.7 (H) 4.8 - 5.6 % Final    Comment:    (NOTE) Pre diabetes:          5.7%-6.4%  Diabetes:              >6.4%  Glycemic control for   <7.0% adults with diabetes     CBG: Recent Labs  Lab 11/20/20 0955 11/20/20 1148 11/20/20 1624 11/20/20 2210 11/21/20 0732  GLUCAP 141* 121* 123* 163* 129*    Signature:  Johnsie Cancel, NP-C [facility] Personal contact information can be found on Amion  If no response please page: Adult pulmonary and  critical care medicine pager on Amion unitl 7pm After 7pm please call [phone] 11/21/2020, 12:08 PM

## 2020-11-21 NOTE — Progress Notes (Signed)
[facility]                                                                              Patient Demographics  Keith [name], is a 70 y.o. male, DOB - 1950-11-11, WGN:[id number]  Admit date - 11/19/2020   Admitting Physician Eben Burow, MD  Outpatient Primary MD for the patient is [name], [name], MD  Outpatient specialists:   LOS - 2  days   Medical records reviewed and are as summarized below:    chief complaint : Pleuritic chest pain      Brief summary   Patient is a 70 year old male with history of hypertension, diabetes mellitus, chronic back pain, PE in 06/2020, has completed anticoagulation, ambulates with a walker presented with cough, chest pain, hemoptysis and fall. Per patient it started 5 days ago when he went out without a coat. When he returned, he started having cold-like symptoms, then fevers chills, purulent sputum from cough. On 11/18/2020, he fell and hit the car door with the left side of the chest and posterior, since then having excruciating pain. He also noticed hemoptysis that started prior to the injury.  On admission, CT chest showed loculated left-sided effusion. CCM was consulted and patient was admitted for further work-up. Patient is fully vaccinated for Covid   Assessment & Plan    Principal Problem:   CAP (community acquired pneumonia) with loculated left-sided effusion -Presented with productive cough, fevers, hemoptysis, infiltrates in the left lower lobe with loculated effusion overlying consolidation -CCM, IR consulted. Continue IV Zithromax, Rocephin -Underwent CT-guided left chest tube placement on 2/12 by IR with first dose lytics given by Dr. Carlis Abbott - continue chest tube, status post fibrinolysis x3 by CCM -Continue pain control, incentive spirometry -Concern for possibility of underlying pulmonary mass, will need further work-up  Active Problems: Acute respiratory failure with hypoxia secondary to #1 -O2 sats  98% on room air, sats 97% on 2 L O2  -Continue chest tube, wean O2 as tolerated     AKI (acute kidney injury) ([place]) -Creatinine 1.48 on admission, baseline 1.1 -Patient was placed on gentle hydration, creatinine improved close to his baseline 1.2  Hypokalemia Potassium 3.3, replaced p.o.    Hyperlipidemia associated with type 2 diabetes mellitus ([place]) -Continue Zocor     Type 2 diabetes mellitus with diabetic neuropathy, unspecified (HCC) - hold oral hypoglycemics -Continue sliding scale insulin, hemoglobin A1c 6.2  -CBG low 46 this morning, change sensitive sliding scale    Chronic pain syndrome, chronic back pain, now with recent fall, left-sided rib contusion -Continue pain control, IV morphine, oxycodone IR, Cymbalta -Continue Lidoderm patch  Constipation -States no BM since admission, continue senna, added Colace 100mg  BID, MiraLAX prn -Mag citrate x1   History of PE -No longer on Eliquis, no apparent PE on CTA  Essential hypertension -BP currently stable, continue atenolol, chlorthalidone, Catapres  Obesity class II Estimated body mass index is 36.62 kg/m as calculated from the following:   Height as of this encounter: 6' (1.829 m).   Weight as of this encounter: 122.5 kg.     Code Status: DNR DVT Prophylaxis:  SCDs  Start: 11/19/20 0833   Level of Care: Level of care: Med-Surg Family Communication: Discussed all imaging results, lab results, explained to the patient   Disposition Plan:     Status is: Inpatient  Remains inpatient appropriate because:Inpatient level of care appropriate due to severity of illness   Dispo: The patient is from: Home              Anticipated d/c is to: Home              Anticipated d/c date is: > 3 days              Patient currently is not medically stable to d/c. Chest tube    Difficult to place patient No      Time Spent in minutes   35 minutes Procedures:  CT-guided chest tube placement on 11/19/2020 by IR  followed by fibrinolysis of the complicated pleural effusion by Dr. Carlis Abbott  Consultants:   Pulmonology IR  Antimicrobials:   Anti-infectives (From admission, onward)   Start     Dose/Rate Route Frequency Ordered Stop   11/19/20 1300  cefTRIAXone (ROCEPHIN) 2 g in sodium chloride 0.9 % 100 mL IVPB        2 g 200 mL/hr over 30 Minutes Intravenous Every 24 hours 11/19/20 0837 11/24/20 1259   11/19/20 1300  azithromycin (ZITHROMAX) 500 mg in sodium chloride 0.9 % 250 mL IVPB        500 mg 250 mL/hr over 60 Minutes Intravenous Every 24 hours 11/19/20 0837 11/24/20 1259   11/19/20 0615  vancomycin (VANCOREADY) IVPB 2000 mg/400 mL        2,000 mg 200 mL/hr over 120 Minutes Intravenous  Once 11/19/20 0600 11/19/20 1004   11/19/20 0615  piperacillin-tazobactam (ZOSYN) IVPB 3.375 g        3.375 g 100 mL/hr over 30 Minutes Intravenous  Once 11/19/20 0600 11/19/20 0727         Medications  Scheduled Meds: . atenolol  50 mg Oral Daily   And  . chlorthalidone  25 mg Oral Daily  . cloNIDine  0.1 mg Oral Daily  . DULoxetine  60 mg Oral Daily  . insulin aspart  0-5 Units Subcutaneous QHS  . insulin aspart  0-9 Units Subcutaneous TID WC  . lidocaine  1 patch Transdermal Q24H  . multivitamin with minerals  1 tablet Oral Daily  . potassium chloride SA  20 mEq Oral Daily  . senna-docusate  2 tablet Oral BID  . simvastatin  40 mg Oral Daily   Continuous Infusions: . sodium chloride 75 mL/hr at 11/21/20 0700  . azithromycin 500 mg (11/21/20 1213)  . cefTRIAXone (ROCEPHIN)  IV 2 g (11/21/20 1333)   PRN Meds:.acetaminophen, alum & mag hydroxide-simeth, guaiFENesin-codeine, HYDROmorphone (DILAUDID) injection, morphine injection, ondansetron (ZOFRAN) IV, oxyCODONE      Subjective:   Keith [name] was seen and examined today.  Pain is better controlled today, states no BM since admission.  Constipated.  No hemoptysis, nausea or vomiting.    Objective:   Vitals:   11/20/20 1512  11/21/20 0441 11/21/20 0443 11/21/20 1344  BP: 119/75  123/80 122/81  Pulse: 88 84 82 79  Resp: 20  18 19   Temp: 98.6 F (37 C) 98.4 F (36.9 C) 98.4 F (36.9 C) 98.9 F (37.2 C)  TempSrc: Oral Oral    SpO2: 96%  98% 99%  Weight:      Height:        Intake/Output  Summary (Last 24 hours) at 11/21/2020 1357 Last data filed at 11/21/2020 1213 Gross per 24 hour  Intake 1125.82 ml  Output 2020 ml  Net -894.18 ml     Wt Readings from Last 3 Encounters:  11/19/20 122.5 kg  07/04/20 120.1 kg  07/03/20 123.3 kg    Physical Exam  General: Alert and oriented x 3, NAD  Cardiovascular: S1 S2 clear, RRR. No pedal edema b/l  Respiratory: Diminished breath sounds in the left + chest tube  Gastrointestinal: Soft, nontender, nondistended, NBS  Ext: no pedal edema bilaterally  Neuro: no new deficits  Musculoskeletal: No cyanosis, clubbing  Skin: No rashes  Psych: Normal affect and demeanor, alert and oriented x3     Data Reviewed:  I have personally reviewed following labs and imaging studies  Micro Results Recent Results (from the past 240 hour(s))  Blood culture (routine x 2)     Status: None (Preliminary result)   Collection Time: 11/19/20  6:40 AM   Specimen: BLOOD LEFT HAND  Result Value Ref Range Status   Specimen Description BLOOD LEFT HAND  Final   Special Requests   Final    BOTTLES DRAWN AEROBIC AND ANAEROBIC Blood Culture adequate volume   Culture   Final    NO GROWTH 1 DAY Performed at [facility], [address] [place],  [place] [address]    Report Status PENDING  Incomplete  Resp Panel by RT-PCR (Flu A&B, Covid) Nasopharyngeal Swab     Status: None   Collection Time: 11/19/20  8:53 AM   Specimen: Nasopharyngeal Swab; Nasopharyngeal(NP) swabs in vial transport medium  Result Value Ref Range Status   SARS Coronavirus 2 by RT PCR NEGATIVE NEGATIVE Final    Comment: (NOTE) SARS-CoV-2 target nucleic acids are NOT DETECTED.  The SARS-CoV-2 RNA is  generally detectable in upper respiratory specimens during the acute phase of infection. The lowest concentration of SARS-CoV-2 viral copies this assay can detect is 138 copies/mL. A negative result does not preclude SARS-Cov-2 infection and should not be used as the sole basis for treatment or other patient management decisions. A negative result may occur with  improper specimen collection/handling, submission of specimen other than nasopharyngeal swab, presence of viral mutation(s) within the areas targeted by this assay, and inadequate number of viral copies(<138 copies/mL). A negative result must be combined with clinical observations, patient history, and epidemiological information. The expected result is Negative.  Fact Sheet for Patients:  EntrepreneurPulse.com.au  Fact Sheet for Healthcare Providers:  IncredibleEmployment.be  This test is no t yet approved or cleared by the [place] FDA and  has been authorized for detection and/or diagnosis of SARS-CoV-2 by FDA under an Emergency Use Authorization (EUA). This EUA will remain  in effect (meaning this test can be used) for the duration of the COVID-19 declaration under Section 564(b)(1) of the Act, 21 U.S.C.section 360bbb-3(b)(1), unless the authorization is terminated  or revoked sooner.       Influenza A by PCR NEGATIVE NEGATIVE Final   Influenza B by PCR NEGATIVE NEGATIVE Final    Comment: (NOTE) The Xpert Xpress SARS-CoV-2/FLU/RSV plus assay is intended as an aid in the diagnosis of influenza from Nasopharyngeal swab specimens and should not be used as a sole basis for treatment. Nasal washings and aspirates are unacceptable for Xpert Xpress SARS-CoV-2/FLU/RSV testing.  Fact Sheet for Patients: EntrepreneurPulse.com.au  Fact Sheet for Healthcare Providers: IncredibleEmployment.be  This test is not yet approved or cleared by the [place]  FDA and has been authorized for detection and/or diagnosis of SARS-CoV-2 by FDA under an Emergency Use Authorization (EUA). This EUA will remain in effect (meaning this test can be used) for the duration of the COVID-19 declaration under Section 564(b)(1) of the Act, 21 U.S.C. section 360bbb-3(b)(1), unless the authorization is terminated or revoked.  Performed at [facility], [place] [address] [place], [place] [address]   Culture, sputum-assessment     Status: None   Collection Time: 11/19/20 12:24 PM   Specimen: Sputum  Result Value Ref Range Status   Specimen Description EXPSU  Final   Special Requests Normal  Final   Sputum evaluation   Final    THIS SPECIMEN IS ACCEPTABLE FOR SPUTUM CULTURE Performed at [facility], [address], [place], [place] [address]    Report Status 11/19/2020 FINAL  Final  Culture, respiratory     Status: None   Collection Time: 11/19/20 12:24 PM  Result Value Ref Range Status   Specimen Description EXPSU  Final   Special Requests Normal Reflexed from [id number]  Final   Gram Stain   Final    ABUNDANT WBC PRESENT, PREDOMINANTLY PMN RARE SQUAMOUS EPITHELIAL CELLS PRESENT MODERATE GRAM NEGATIVE RODS FEW GRAM POSITIVE COCCI IN PAIRS    Culture   Final    RARE Normal respiratory flora-no Staph aureus or Pseudomonas seen Performed at [facility], [place] [address] [place], [place] [address]    Report Status 11/21/2020 FINAL  Final  Body fluid culture     Status: None (Preliminary result)   Collection Time: 11/19/20  1:33 PM   Specimen: Pleura  Result Value Ref Range Status   Specimen Description PLEURAL FLUID  Final   Special Requests LEFT  Final   Gram Stain   Final    ABUNDANT WBC PRESENT, PREDOMINANTLY PMN NO ORGANISMS SEEN    Culture   Final    NO GROWTH 2 DAYS Performed at [facility], [place] [address], [place], [place] [address]    Report Status PENDING  Incomplete    Radiology Reports CT Angio Chest PE  W and/or Wo Contrast  Result Date: 11/19/2020 CLINICAL DATA:  70 year old male status post fall yesterday striking left ribs again scar. Pain. Shallow inspiration. EXAM: CT ANGIOGRAPHY CHEST WITH CONTRAST TECHNIQUE: Multidetector CT imaging of the chest was performed using the standard protocol during bolus administration of intravenous contrast. Multiplanar CT image reconstructions and MIPs were obtained to evaluate the vascular anatomy. CONTRAST:  174mL OMNIPAQUE IOHEXOL 350 MG/ML SOLN COMPARISON:  Portable chest 0436 hours today. CTA chest 06/26/2020. 01/19/2012 CT Abdomen and Pelvis FINDINGS: Cardiovascular: Adequate contrast bolus timing in the pulmonary arterial tree. Respiratory motion, respiratory motion, pronounced at the lung bases and also the right hilum. No central pulmonary thrombus, but in segmental branches of the right upper lobe (series 6, image 101) and both lower lobes (series 6, image 154 and 161) there is suggestion of small volume nonocclusive clot. However, saddle and hilar embolus was demonstrated in September. Therefore, this is likely the residual of previous pulmonary embolus. Stable cardiac size at the upper limits of normal. No pericardial effusion. Negative visible aorta aside from tortuosity and minimal plaque. Mediastinum/Nodes: Negative.  No mediastinal hematoma. Lungs/Pleura: Mostly loculated, minimally layering left pleural effusion with mildly hyperdense fluid loculated in the left lateral chest, but low-density appearing fluid elsewhere. Some fluid trapped in the major fissure. Superimposed confluent left lower lobe heterogeneous consolidation, where the left lower  lobe was clear in September. Left major airways remain patent. No left pneumothorax. Right lung is negative aside from mild atelectasis. Upper Abdomen: Negative visible liver, gallbladder, spleen, pancreas, and bowel. Stable left adrenal thickening/nodularity greater on the left. Density there is fairly low (12  Hounsfield units, and some macroscopic fat seems to be visible (series 5, image 85) and furthermore this has not significantly changed since CT Abdomen and Pelvis 01/19/2012, consistent with benign adenoma. Stable visible kidneys, benign-appearing left upper pole cyst. Musculoskeletal: No left-side rib fracture is identified. There is severe degeneration in the spine with S-shaped thoracolumbar scoliosis and chronic thoracolumbar junction laminectomy changes. Stable visualized osseous structures. Review of the MIP images confirms the above findings. IMPRESSION: 1. 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 2. Mixed loculated > layering left Pleural Effusion. Much of the fluid has low-density, but Hemothorax is difficult to exclude in the setting of trauma. However, no overlying rib fracture is identified. 3. Superimposed left lower lobe consolidation more suggestive of Pneumonia than Pulmonary Contusion. Pulmonary infarct persisting since September is unlikely. 4. Stable benign adrenal thickening, left adrenal adenoma. Electronically Signed   By: Genevie Ann M.D.   On: 11/19/2020 05:49   DG Chest Port 1 View  Result Date: 11/21/2020 CLINICAL DATA:  Chest tube. EXAM: PORTABLE CHEST 1 VIEW COMPARISON:  11/20/2020 FINDINGS: Pigtail type catheter overlies the LEFT lung base, stable in appearance. There is persistent opacity throughout the LEFT LOWER lobe. Focal lucency along the LATERAL aspect of the LEFT hemithorax appears stable, consistent with small loculated pneumothorax. Loculated pleural effusion projects to the LEFT of the aortic knob, within the major fissure, seen on CT exam. Shallow lung inflation.  No pulmonary edema. IMPRESSION: 1. Persistent significant opacity throughout the LEFT LOWER lobe. 2. Persistent loculated pleural effusion. 3. Persistent small loculated pneumothorax. Electronically Signed    By: Nolon Nations M.D.   On: 11/21/2020 08:07   DG CHEST PORT 1 VIEW  Result Date: 11/20/2020 CLINICAL DATA:  70 year old male with recent fall, left pleural effusion and airspace disease. Status post CT-guided left chest tube placement yesterday. EXAM: PORTABLE CHEST 1 VIEW COMPARISON:  CTA chest 11/19/2020 and earlier. FINDINGS: Portable AP semi upright view at 0419 hours. Pigtail left chest tube now in place. Skin fold artifacts suspected in the left chest. No significant pneumothorax although there may be a small focus of loculated pleural gas laterally (arrow). Continued low lung volumes. Patchy and confluent left lung base opacity persists. Stable cardiac size and mediastinal contours. No overt edema. Scoliosis. Stable visualized osseous structures. Negative visible bowel gas pattern. IMPRESSION: 1. Left chest tube in place with no significant pneumothorax; trace loculated pleural gas laterally. 2. Continued low lung volumes and left lung base opacity. Electronically Signed   By: Genevie Ann M.D.   On: 11/20/2020 07:00   DG Chest Port 1 View  Result Date: 11/19/2020 CLINICAL DATA:  Chest pain post fall. Fall yesterday from car. Left-sided pain radiating to the back. EXAM: PORTABLE CHEST 1 VIEW COMPARISON:  Radiograph and CT 06/26/2020 FINDINGS: Low lung volumes. Upper normal heart size, unchanged from prior exam. Aortic tortuosity is unchanged. Left pleural effusion which partially tracks laterally. Patchy retrocardiac opacity. No visualized pneumothorax. Right lung is clear. Scoliotic curvature of the spine. No visualized left rib fractures. IMPRESSION: Left pleural effusion with patchy retrocardiac opacity, may be contusion in the setting of injury. No visualized pneumothorax or  left rib fractures. Electronically Signed   By: Keith Rake M.D.   On: 11/19/2020 03:53   CT IMAGE GUIDED DRAINAGE BY PERCUTANEOUS CATHETER  Result Date: 11/19/2020 INDICATION: Left loculated effusion EXAM: CT-GUIDED  POSTERIOR LEFT 10 FRENCH CHEST TUBE PLACEMENT MEDICATIONS: The patient is currently admitted to the hospital and receiving intravenous antibiotics. The antibiotics were administered within an appropriate time frame prior to the initiation of the procedure. ANESTHESIA/SEDATION: Fentanyl 50 mcg IV; Versed 1.0 mg IV Moderate Sedation Time:  11 MINUTES The patient was continuously monitored during the procedure by the interventional radiology nurse under my direct supervision. COMPLICATIONS: None immediate. PROCEDURE: Informed written consent was obtained from the patient after a thorough discussion of the procedural risks, benefits and alternatives. All questions were addressed. Maximal Sterile Barrier Technique was utilized including caps, mask, sterile gowns, sterile gloves, sterile drape, hand hygiene and skin antiseptic. A timeout was performed prior to the initiation of the procedure. previous imaging reviewed. patient position right side down decubitus. noncontrast localization ct performed. the left complex loculated effusion was localized and marked for a posterolateral approach. under sterile conditions and local anesthesia, an 18 gauge introducer needle was advanced from a lateral intercostal approach into the effusion. needle position confirmed with ct. syringe aspiration yielded serous pleural fluid. sample sent for culture. guidewire inserted followed by tract dilatation to insert a 10 french drain. drain catheter position confirmed with ct. catheter secured with prolene suture and connected to external pleura vac. sterile dressing applied. no immediate complication. patient tolerated the procedure well. IMPRESSION: Successful CT-guided 10 French left chest tube insertion Electronically Signed   By: Jerilynn Mages.  Shick M.D.   On: 11/19/2020 13:52    Lab Data:  CBC: Recent Labs  Lab 11/19/20 0325 11/19/20 0419 11/20/20 0122 11/21/20 0200  WBC 10.4  --  17.9* 14.9*  NEUTROABS 8.4*  --  14.7*  --   HGB  12.4* 13.6 12.4* 11.6*  HCT 39.0 40.0 38.3* 36.7*  MCV 90.3  --  88.0 91.8  PLT 446*  --  460* 622   Basic Metabolic Panel: Recent Labs  Lab 11/19/20 0325 11/19/20 0419 11/20/20 0122 11/21/20 0718  NA 136 140 138 140  K 3.3* 3.3* 3.1* 3.3*  CL 99 101 101 99  CO2 26  --  26 22  GLUCOSE 158* 156* 177* 46*  BUN 26* 31* 16 17  CREATININE 1.48* 1.40* 1.21 1.22  CALCIUM 9.9  --  9.5 9.7  MG  --   --   --  2.1  PHOS  --   --   --  2.5   GFR: Estimated Creatinine Clearance: 78.4 mL/min (by C-G formula based on SCr of 1.22 mg/dL). Liver Function Tests: No results for input(s): AST, ALT, ALKPHOS, BILITOT, PROT, ALBUMIN in the last 168 hours. No results for input(s): LIPASE, AMYLASE in the last 168 hours. No results for input(s): AMMONIA in the last 168 hours. Coagulation Profile: No results for input(s): INR, PROTIME in the last 168 hours. Cardiac Enzymes: No results for input(s): CKTOTAL, CKMB, CKMBINDEX, TROPONINI in the last 168 hours. BNP (last 3 results) No results for input(s): PROBNP in the last 8760 hours. HbA1C: Recent Labs    11/21/20 0200  HGBA1C 6.2*   CBG: Recent Labs  Lab 11/20/20 1148 11/20/20 1624 11/20/20 2210 11/21/20 0732 11/21/20 1341  GLUCAP 121* 123* 163* 129* 123*   Lipid Profile: No results for input(s): CHOL, HDL, LDLCALC, TRIG, CHOLHDL, LDLDIRECT in the last 72 hours. Thyroid  Function Tests: No results for input(s): TSH, T4TOTAL, FREET4, T3FREE, THYROIDAB in the last 72 hours. Anemia Panel: No results for input(s): VITAMINB12, FOLATE, FERRITIN, TIBC, IRON, RETICCTPCT in the last 72 hours. Urine analysis:    Component Value Date/Time   COLORURINE YELLOW 01/19/2019 1422   APPEARANCEUR HAZY (A) 01/19/2019 1422   LABSPEC 1.020 01/19/2019 1422   PHURINE 5.0 01/19/2019 1422   GLUCOSEU NEGATIVE 01/19/2019 1422   HGBUR MODERATE (A) 01/19/2019 1422   HGBUR negative 10/26/2010 0824   BILIRUBINUR NEGATIVE 01/19/2019 1422   BILIRUBINUR n  01/12/2019 1020   KETONESUR NEGATIVE 01/19/2019 1422   PROTEINUR 100 (A) 01/19/2019 1422   UROBILINOGEN 0.2 01/12/2019 1020   UROBILINOGEN 0.2 04/10/2013 1555   NITRITE NEGATIVE 01/19/2019 1422   LEUKOCYTESUR NEGATIVE 01/19/2019 1422     Ripudeep Rai M.D. [facility] 11/21/2020, 1:57 PM   Call night coverage person covering after 7pm

## 2020-11-22 ENCOUNTER — Inpatient Hospital Stay (HOSPITAL_COMMUNITY): Payer: Medicare Other

## 2020-11-22 DIAGNOSIS — J189 Pneumonia, unspecified organism: Secondary | ICD-10-CM | POA: Diagnosis not present

## 2020-11-22 DIAGNOSIS — J13 Pneumonia due to Streptococcus pneumoniae: Secondary | ICD-10-CM | POA: Diagnosis not present

## 2020-11-22 DIAGNOSIS — J90 Pleural effusion, not elsewhere classified: Secondary | ICD-10-CM | POA: Diagnosis not present

## 2020-11-22 LAB — BASIC METABOLIC PANEL
Anion gap: 11 (ref 5–15)
BUN: 16 mg/dL (ref 8–23)
CO2: 25 mmol/L (ref 22–32)
Calcium: 9.1 mg/dL (ref 8.9–10.3)
Chloride: 101 mmol/L (ref 98–111)
Creatinine, Ser: 1.11 mg/dL (ref 0.61–1.24)
GFR, Estimated: >60 mL/min (ref >60)
Glucose, Bld: 141 mg/dL — ABNORMAL HIGH (ref 70–99)
Potassium: 3.4 mmol/L — ABNORMAL LOW (ref 3.5–5.1)
Sodium: 137 mmol/L (ref 135–145)

## 2020-11-22 LAB — GLUCOSE, CAPILLARY
Glucose-Capillary: 125 mg/dL — ABNORMAL HIGH (ref 70–99)
Glucose-Capillary: 102 mg/dL — ABNORMAL HIGH (ref 70–99)
Glucose-Capillary: 121 mg/dL — ABNORMAL HIGH (ref 70–99)
Glucose-Capillary: 137 mg/dL — ABNORMAL HIGH (ref 70–99)

## 2020-11-22 LAB — CBC
HCT: 33.4 % — ABNORMAL LOW (ref 39.0–52.0)
Hemoglobin: 11.1 g/dL — ABNORMAL LOW (ref 13.0–17.0)
MCH: 29.3 pg (ref 26.0–34.0)
MCHC: 33.2 g/dL (ref 30.0–36.0)
MCV: 88.1 fL (ref 80.0–100.0)
Platelets: 393 10*3/uL (ref 150–400)
RBC: 3.79 MIL/uL — ABNORMAL LOW (ref 4.22–5.81)
RDW: 15.5 % (ref 11.5–15.5)
WBC: 11.7 10*3/uL — ABNORMAL HIGH (ref 4.0–10.5)
nRBC: 0 % (ref 0.0–0.2)

## 2020-11-22 NOTE — Care Management Important Message (Signed)
Important Message  Patient Details  Name: Keith [name] MRN: [id number] Date of Birth: 02/22/1951   Medicare Important Message Given:  Yes     [name] 11/22/2020, 1:24 PM

## 2020-11-22 NOTE — Evaluation (Signed)
Physical Therapy Evaluation Patient Details Name: Keith [name] MRN: [id number] DOB: [birth year] Today's Date: 11/22/2020   History of Present Illness  70 yo male admitted s/p fall with PNA L sided effusion with chest tube placement 2/12  PMH HTN DM chronic back pain PE 06/2020 obese    Clinical Impression  Pt very emotional upon PT/OT entry and was "venting" about his life events since retirement stating "most people my age would commit suicide." Spoke with Dr. Tana Coast regarding patients comment and stated she experienced the same and will put in a psych consult for underlying depression. PT/OT eval limited by tangential speech following arrival of transportation tech to take pt to [place]. Pt was using a RW PTA and was able to do most ADLs. Pt requiring modA via bilat HHA to side step to [facility]. Pt reports "I do better with a walker." "It's my breathing more than anything." Acute PT to further assess ambulation next session and re-evaluate d/c recommendations.     Follow Up Recommendations Home health PT;Supervision/Assistance - 24 hour (will further assess s/p ambulation progression)    Equipment Recommendations  None recommended by PT    Recommendations for Other Services       Precautions / Restrictions Precautions Precautions: Fall Precaution Comments: L chest tube Restrictions Weight Bearing Restrictions: No      Mobility  Bed Mobility Overal bed mobility: Needs Assistance Bed Mobility: Supine to Sit     Supine to sit: Min guard     General bed mobility comments: min guard for safety due to L chest tube line, increased time required    Transfers Overall transfer level: Needs assistance Equipment used: None Transfers: Sit to/from Stand Sit to Stand: Min assist;+2 physical assistance         General transfer comment: min guard for safety due to tremor but no physical assist  Ambulation/Gait   Gait Distance (Feet):  (4 side steps to [facility])         General Gait  Details: lateral steps to [facility] requiring modAx2 for stabilty, limited due to transportation coming to take to [place]  Stairs            Wheelchair Mobility    Modified Rankin (Stroke Patients Only)       Balance Overall balance assessment: Needs assistance Sitting-balance support: No upper extremity supported;Feet supported Sitting balance-Leahy Scale: Good     Standing balance support: Bilateral upper extremity supported;During functional activity Standing balance-Leahy Scale: Poor Standing balance comment: dependent on external support                             Pertinent Vitals/Pain Pain Assessment: No/denies pain    Home Living Family/patient expects to be discharged to:: Private residence Living Arrangements: Spouse/significant other Available Help at Discharge: Family;Available 24 hours/day Type of Home: House Home Access: Stairs to enter Entrance Stairs-Rails: Right Entrance Stairs-Number of Steps: 3 Home Layout: Two level;1/2 bath on main level;Bed/bath upstairs Home Equipment: Walker - 4 wheels;Cane - single point;Grab bars - toilet;Grab bars - tub/shower;Bedside commode;Adaptive equipment;Wheelchair - Insurance claims handler - 2 wheels      Prior Function Level of Independence: Independent with assistive device(s)         Comments: rollator     Hand Dominance   Dominant Hand: Right    Extremity/Trunk Assessment   Upper Extremity Assessment Upper Extremity Assessment: Defer to OT evaluation    Lower Extremity Assessment Lower Extremity Assessment: Generalized  weakness (L foot deformity from previous injury)    Cervical / Trunk Assessment Cervical / Trunk Assessment: Other exceptions Cervical / Trunk Exceptions: reports chronic back pain  Communication   Communication: No difficulties  Cognition Arousal/Alertness: Awake/alert Behavior During Therapy: WFL for tasks assessed/performed Overall Cognitive Status: Within Functional  Limits for tasks assessed                                        General Comments General comments (skin integrity, edema, etc.): pt with L chest tube, 2Lo2 via [place]    Exercises     Assessment/Plan    PT Assessment Patient needs continued PT services  PT Problem List Decreased strength;Decreased range of motion;Decreased activity tolerance;Decreased balance;Decreased mobility;Decreased coordination;Decreased knowledge of use of DME       PT Treatment Interventions DME instruction;Gait training;Functional mobility training;Therapeutic activities;Therapeutic exercise    PT Goals (Current goals can be found in the Care Plan section)  Acute Rehab PT Goals Patient Stated Goal: home PT Goal Formulation: With patient Time For Goal Achievement: 12/06/20    Frequency Min 3X/week   Barriers to discharge        Co-evaluation PT/OT/SLP Co-Evaluation/Treatment: Yes Reason for Co-Treatment: To address functional/ADL transfers PT goals addressed during session: Mobility/safety with mobility         AM-PAC PT "6 Clicks" Mobility  Outcome Measure Help needed turning from your back to your side while in a flat bed without using bedrails?: None Help needed moving from lying on your back to sitting on the side of a flat bed without using bedrails?: None Help needed moving to and from a bed to a chair (including a wheelchair)?: A Little Help needed standing up from a chair using your arms (e.g., wheelchair or bedside chair)?: A Little Help needed to walk in hospital room?: A Little Help needed climbing 3-5 steps with a railing? : A Lot 6 Click Score: 19    End of Session Equipment Utilized During Treatment: Oxygen (2Lo2 via [place]) Activity Tolerance: Patient tolerated treatment well Patient left: in bed (with OT and transportation) Nurse Communication: Mobility status PT Visit Diagnosis: Unsteadiness on feet (R26.81);Muscle weakness (generalized) (M62.81);Difficulty in  walking, not elsewhere classified (R26.2)    Time: 2330-0762 PT Time Calculation (min) (ACUTE ONLY): 16 min   Charges:   PT Evaluation $PT Eval Moderate Complexity: 1 Mod          Kittie Plater, PT, DPT Acute Rehabilitation Services Pager #: [phone] Office #: [phone]   Berline Lopes 11/22/2020, 12:00 PM

## 2020-11-22 NOTE — Evaluation (Signed)
Occupational Therapy Evaluation Patient Details Name: Keith [name] MRN: [id number] DOB: Aug 08, 1951 Today's Date: 11/22/2020    History of Present Illness 70 yo male admitted s/p fall with PNA L sided effusion with chest tube placement 2/12  PMH HTN DM chronic back pain PE 06/2020 obese   Clinical Impression   Patient is s/p PnA chest tube placement L sdie surgery resulting in functional limitations due to the deficits listed below (see OT problem list). Session was cut short due to transport arriving for CT scan. Pt tolerated progression to simulate basic transfer needs. Pt expressed feeling able to d/ chome at current level with wife support. Please see comments below regarding pt expressing struggle with mentally throughout multiple medical complications.  Patient will benefit from skilled OT acutely to increase independence and safety with ADLS to allow discharge [place].     Follow Up Recommendations  Home health OT    Equipment Recommendations  None recommended by OT    Recommendations for Other Services Other (comment) (pastoral care / support group/ therapy - someone to talk to that is not family to really HEAR him)     Precautions / Restrictions Precautions Precautions: Fall Precaution Comments: L chest tube Restrictions Weight Bearing Restrictions: No      Mobility Bed Mobility Overal bed mobility: Needs Assistance Bed Mobility: Supine to Sit     Supine to sit: Min guard     General bed mobility comments: min guard for safety due to L chest tube line, increased time required    Transfers Overall transfer level: Needs assistance Equipment used: None Transfers: Sit to/from Stand Sit to Stand: Min assist;+2 physical assistance         General transfer comment: min guard for safety due to tremor but no physical assist    Balance Overall balance assessment: Needs assistance Sitting-balance support: No upper extremity supported;Feet supported Sitting  balance-Leahy Scale: Good     Standing balance support: Bilateral upper extremity supported;During functional activity Standing balance-Leahy Scale: Poor Standing balance comment: dependent on external support                           ADL either performed or assessed with clinical judgement   ADL Overall ADL's : Needs assistance/impaired Eating/Feeding: Modified independent;Bed level   Grooming: Modified independent;Sitting   Upper Body Bathing: Set up;Sitting   Lower Body Bathing: Minimal assistance;Sit to/from stand       Lower Body Dressing: Minimal assistance Lower Body Dressing Details (indicate cue type and reason): pt is able to reach feet in sitting EOB position. Toilet Transfer: +2 for physical assistance;Moderate assistance             General ADL Comments: pt noted to express concerns with caregiver burn out and now being a burden to his family. pt expressed having spiritual belief and declined pastoral care support from therapist. Pt made statement "anyone else in my situation would have ended their" "i hate when someone tells me they understand they dont! they dont know what i feel or how." "this is as much mental as physical but no one gets that. not even my wife" Pt expressing frustration with health decline and inability to really enjoy retirement     Vision Baseline Vision/History: Wears glasses Wears Glasses: At all times Patient Visual Report: No change from baseline Vision Assessment?: No apparent visual deficits     Perception     Praxis      Pertinent  Vitals/Pain Pain Assessment: No/denies pain     Hand Dominance Right   Extremity/Trunk Assessment Upper Extremity Assessment Upper Extremity Assessment: Overall WFL for tasks assessed (somewhat restricted due to tube on L side but will resolve with removal)   Lower Extremity Assessment Lower Extremity Assessment: Generalized weakness   Cervical / Trunk Assessment Cervical / Trunk  Assessment: Other exceptions Cervical / Trunk Exceptions: reports chronic back pain   Communication Communication Communication: No difficulties   Cognition Arousal/Alertness: Awake/alert Behavior During Therapy: WFL for tasks assessed/performed Overall Cognitive Status: Within Functional Limits for tasks assessed                                     General Comments  L chest tube 2L [place]    Exercises     Shoulder Instructions      Home Living Family/patient expects to be discharged to:: Private residence Living Arrangements: Spouse/significant other Available Help at Discharge: Family;Available 24 hours/day Type of Home: House Home Access: Stairs to enter [employer] of Steps: 3 Entrance Stairs-Rails: Right Home Layout: Two level;1/2 bath on main level;Bed/bath upstairs Alternate Level Stairs-Number of Steps: 13 Alternate Level Stairs-Rails: Right Bathroom Shower/Tub: Occupational psychologist: Handicapped height Bathroom Accessibility: Yes   Home Equipment: Environmental consultant - 4 wheels;Cane - single point;Grab bars - toilet;Grab bars - tub/shower;Bedside commode;Adaptive equipment;Wheelchair - Insurance claims handler - 2 wheels Adaptive Equipment: Reacher;Sock aid;Long-handled shoe horn;Long-handled sponge        Prior Functioning/Environment Level of Independence: Independent with assistive device(s)        Comments: rollator        OT Problem List: Decreased strength;Decreased activity tolerance;Impaired balance (sitting and/or standing);Decreased knowledge of precautions;Decreased knowledge of use of DME or AE;Decreased safety awareness      OT Treatment/Interventions: Self-care/ADL training;Therapeutic exercise;Energy conservation;DME and/or AE instruction;Manual therapy;Therapeutic activities;Patient/family education;Balance training    OT Goals(Current goals can be found in the care plan section) Acute Rehab OT Goals Patient Stated  Goal: home OT Goal Formulation: With patient Time For Goal Achievement: 12/06/20 Potential to Achieve Goals: Good  OT Frequency: Min 2X/week   Barriers to D/C:            Co-evaluation PT/OT/SLP Co-Evaluation/Treatment: Yes Reason for Co-Treatment: To address functional/ADL transfers;For patient/therapist safety PT goals addressed during session: Mobility/safety with mobility OT goals addressed during session: Proper use of Adaptive equipment and DME;ADL's and self-care      AM-PAC OT "6 Clicks" Daily Activity     Outcome Measure Help from another person eating meals?: None Help from another person taking care of personal grooming?: A Little Help from another person toileting, which includes using toliet, bedpan, or urinal?: A Lot Help from another person bathing (including washing, rinsing, drying)?: A Lot Help from another person to put on and taking off regular upper body clothing?: A Little Help from another person to put on and taking off regular lower body clothing?: A Little 6 Click Score: 17   End of Session Equipment Utilized During Treatment: Oxygen Nurse Communication: Mobility status;Precautions  Activity Tolerance: Patient tolerated treatment well Patient left: in bed;with call bell/phone within reach;Other (comment) (leaving with transport for CT)  OT Visit Diagnosis: Unsteadiness on feet (R26.81);Muscle weakness (generalized) (M62.81)                Time: 1610-9604 OT Time Calculation (min): 16 min Charges:  OT General Charges $OT Visit: 1  Visit OT Evaluation $OT Eval Low Complexity: 1 Low   Keith [name], OTR/L  Acute Rehabilitation Services Pager: [phone] Office: [phone] .   Jeri Modena 11/22/2020, 12:55 PM

## 2020-11-22 NOTE — Progress Notes (Addendum)
[facility]                                                                              Patient Demographics  Keith [name], is a 70 y.o. male, DOB - 10-Feb-1951, Keith [name]  Admit date - 11/19/2020   Admitting Physician Keith Burow, MD  Outpatient Primary MD for the patient is [name], [name], MD  Outpatient specialists:   LOS - 3  days   Medical records reviewed and are as summarized below:    chief complaint : Pleuritic chest pain      Brief summary   Patient is a 70 year old male with history of hypertension, diabetes mellitus, chronic back pain, PE in 06/2020, has completed anticoagulation, ambulates with a walker presented with cough, chest pain, hemoptysis and fall. Per patient it started 5 days ago when he went out without a coat. When he returned, he started having cold-like symptoms, then fevers chills, purulent sputum from cough. On 11/18/2020, he fell and hit the car door with the left side of the chest and posterior, since then having excruciating pain. He also noticed hemoptysis that started prior to the injury.  On admission, CT chest showed loculated left-sided effusion. CCM was consulted and patient was admitted for further work-up. Patient is fully vaccinated for Covid  S/p left chest tube placement on 2/12 by IR with fibrinolysis x3 doses by CCM.  CCM managing the chest tube.   Patient will need home health PT/24-hour supervision when cleared for discharge    Assessment & Plan    Principal Problem:   CAP (community acquired pneumonia) with loculated left-sided effusion, acute respiratory failure with hypoxia -Presented with productive cough, fevers, hemoptysis, infiltrates in the left lower lobe with loculated effusion overlying consolidation -CCM, IR consulted. Continue IV Zithromax, Rocephin -Underwent CT-guided left chest tube placement on 2/12 by IR - continue chest tube, status post fibrinolysis x3 doses, CCM managing the  chest tube -Continue pain control, incentive spirometry -Concern for possibility of underlying pulmonary mass, will need further work-up.  T -Per pulmonology, TCTS consult for additional chest tube vs VATS vs prolonged course of antibiotics. -CT chest today shows significant loculated component in the superior aspect of the left major fissure remains despite drainage. Addendum I was notified by the TCTS that Dr. Kipp Brood has already discussed with Dr. Carlis Abbott (CCM) that patient does not need VATS but will need prolonged course of antibiotics.  Active problems Lung nodule bilaterally, 5 mm left upper lobe -Incidentally seen on the CT chest, will need outpatient follow-up imaging     AKI (acute kidney injury) ([place]) -Creatinine 1.48 on admission, baseline 1.1 -Patient was placed on IV fluid hydration, creatinine now stable, at baseline 1.1  Hypokalemia Potassium 3.4, on daily replacement    Hyperlipidemia associated with type 2 diabetes mellitus ([place]) -Continue Zocor    Type 2 diabetes mellitus with diabetic neuropathy, unspecified ([place]), uncontrolled with hypoglycemia, not on insulin long-term -Continue to hold oral hypoglycemics -Continue sliding scale insulin, hemoglobin A1c 6.2  -CBGs better after changing to sensitive sliding scale, no hypoglycemia    Chronic pain syndrome, chronic back pain, now with recent  fall, left-sided rib contusion -Continue pain control, IV morphine, oxycodone IR, Cymbalta -Continue Lidoderm patch  Constipation -Resolved  History of PE -No longer on Eliquis, no apparent PE on CTA  Essential hypertension -BP stable, continue Catapres, atenolol, chlorthalidone   depression -Needs screening for depression, overwhelmed with the situation, psychiatry consulted   Obesity class II Estimated body mass index is 36.62 kg/m as calculated from the following:   Height as of this encounter: 6' (1.829 m).   Weight as of this encounter: 122.5 kg.     Code  Status: DNR DVT Prophylaxis:  SCDs Start: 11/19/20 0833   Level of Care: Level of care: Med-Surg Family Communication: Discussed all imaging results, lab results, explained to the patient's wife, Mrs [name] on phone today 2/15   Disposition Plan:     Status is: Inpatient  Remains inpatient appropriate because:Inpatient level of care appropriate due to severity of illness   Dispo: The patient is from: Home              Anticipated d/c is to: Home              Anticipated d/c date is: > 3 days              Patient currently is not medically stable to d/c. Chest tube    Difficult to place patient No      Time Spent in minutes   35 minutes Procedures:  CT-guided chest tube placement on 11/19/2020 by IR followed by fibrinolysis of the complicated pleural effusion by Dr. Carlis Abbott  Consultants:   Pulmonology IR  Antimicrobials:   Anti-infectives (From admission, onward)   Start     Dose/Rate Route Frequency Ordered Stop   11/19/20 1300  cefTRIAXone (ROCEPHIN) 2 [name] in sodium chloride 0.9 % 100 mL IVPB        2 [name] 200 mL/hr over 30 Minutes Intravenous Every 24 hours 11/19/20 0837 11/24/20 1259   11/19/20 1300  azithromycin (ZITHROMAX) 500 mg in sodium chloride 0.9 % 250 mL IVPB        500 mg 250 mL/hr over 60 Minutes Intravenous Every 24 hours 11/19/20 0837 11/24/20 1259   11/19/20 0615  vancomycin (VANCOREADY) IVPB 2000 mg/400 mL        2,000 mg 200 mL/hr over 120 Minutes Intravenous  Once 11/19/20 0600 11/19/20 1004   11/19/20 0615  piperacillin-tazobactam (ZOSYN) IVPB 3.375 [name]        3.375 [name] 100 mL/hr over 30 Minutes Intravenous  Once 11/19/20 0600 11/19/20 0727         Medications  Scheduled Meds: . atenolol  50 mg Oral Daily   And  . chlorthalidone  25 mg Oral Daily  . cloNIDine  0.1 mg Oral Daily  . docusate sodium  100 mg Oral BID  . DULoxetine  60 mg Oral Daily  . insulin aspart  0-5 Units Subcutaneous QHS  . insulin aspart  0-9 Units Subcutaneous TID  WC  . lidocaine  1 patch Transdermal Q24H  . multivitamin with minerals  1 tablet Oral Daily  . potassium chloride SA  20 mEq Oral Daily  . senna-docusate  2 tablet Oral BID  . simvastatin  40 mg Oral Daily   Continuous Infusions: . sodium chloride 75 mL/hr at 11/21/20 2135  . azithromycin 500 mg (11/22/20 1456)  . cefTRIAXone (ROCEPHIN)  IV 2 [name] (11/22/20 1343)   PRN Meds:.acetaminophen, alum & mag hydroxide-simeth, guaiFENesin-codeine, HYDROmorphone (DILAUDID) injection, morphine injection, ondansetron (ZOFRAN)  IV, oxyCODONE, polyethylene glycol      Subjective:   Jceon Alverio was seen and examined today.  No acute complaints, working with PT.  Constipation resolved.  No hemoptysis.  No nausea or vomiting.  Chest pain + controlled with pain medications   Objective:   Vitals:   11/21/20 1344 11/21/20 2022 11/22/20 0400 11/22/20 1501  BP: 122/81 125/77 110/74 117/72  Pulse: 79 79 86 76  Resp: 19 19 18    Temp: 98.9 F (37.2 C) 98.8 F (37.1 C) 98.7 F (37.1 C) 98.2 F (36.8 C)  TempSrc:  Oral Oral   SpO2: 99% 99% 96% 95%  Weight:      Height:        Intake/Output Summary (Last 24 hours) at 11/22/2020 1525 Last data filed at 11/22/2020 1118 Gross per 24 hour  Intake 240 ml  Output 1150 ml  Net -910 ml     Wt Readings from Last 3 Encounters:  11/19/20 122.5 kg  07/04/20 120.1 kg  07/03/20 123.3 kg   Physical Exam  General: Alert and oriented x 3, NAD  Cardiovascular: S1 S2 clear, RRR. No pedal edema b/l  Respiratory: Diminished breath sound on the left + chest tube  Gastrointestinal: Soft, nontender, nondistended, NBS  Ext: no pedal edema bilaterally  Neuro: no new deficits  Musculoskeletal: No cyanosis, clubbing  Skin: No rashes  Psych: Normal affect and demeanor, alert and oriented x3      Data Reviewed:  I have personally reviewed following labs and imaging studies  Micro Results Recent Results (from the past 240 hour(s))  Blood culture  (routine x 2)     Status: None (Preliminary result)   Collection Time: 11/19/20  6:40 AM   Specimen: BLOOD LEFT HAND  Result Value Ref Range Status   Specimen Description BLOOD LEFT HAND  Final   Special Requests   Final    BOTTLES DRAWN AEROBIC AND ANAEROBIC Blood Culture adequate volume   Culture   Final    NO GROWTH 2 DAYS Performed at [facility], [address], [place], [place] [address]    Report Status PENDING  Incomplete  Resp Panel by RT-PCR (Flu A&B, Covid) Nasopharyngeal Swab     Status: None   Collection Time: 11/19/20  8:53 AM   Specimen: Nasopharyngeal Swab; Nasopharyngeal(NP) swabs in vial transport medium  Result Value Ref Range Status   SARS Coronavirus 2 by RT PCR NEGATIVE NEGATIVE Final    Comment: (NOTE) SARS-CoV-2 target nucleic acids are NOT DETECTED.  The SARS-CoV-2 RNA is generally detectable in upper respiratory specimens during the acute phase of infection. The lowest concentration of SARS-CoV-2 viral copies this assay can detect is 138 copies/mL. A negative result does not preclude SARS-Cov-2 infection and should not be used as the sole basis for treatment or other patient management decisions. A negative result may occur with  improper specimen collection/handling, submission of specimen other than nasopharyngeal swab, presence of viral mutation(s) within the areas targeted by this assay, and inadequate number of viral copies(<138 copies/mL). A negative result must be combined with clinical observations, patient history, and epidemiological information. The expected result is Negative.  Fact Sheet for Patients:  EntrepreneurPulse.com.au  Fact Sheet for Healthcare Providers:  IncredibleEmployment.be  This test is no t yet approved or cleared by the [place] FDA and  has been authorized for detection and/or diagnosis of SARS-CoV-2 by FDA under an Emergency Use Authorization (EUA). This EUA will remain   in effect (meaning  this test can be used) for the duration of the COVID-19 declaration under Section 564(b)(1) of the Act, 21 U.S.C.section 360bbb-3(b)(1), unless the authorization is terminated  or revoked sooner.       Influenza A by PCR NEGATIVE NEGATIVE Final   Influenza B by PCR NEGATIVE NEGATIVE Final    Comment: (NOTE) The Xpert Xpress SARS-CoV-2/FLU/RSV plus assay is intended as an aid in the diagnosis of influenza from Nasopharyngeal swab specimens and should not be used as a sole basis for treatment. Nasal washings and aspirates are unacceptable for Xpert Xpress SARS-CoV-2/FLU/RSV testing.  Fact Sheet for Patients: EntrepreneurPulse.com.au  Fact Sheet for Healthcare Providers: IncredibleEmployment.be  This test is not yet approved or cleared by the [place] FDA and has been authorized for detection and/or diagnosis of SARS-CoV-2 by FDA under an Emergency Use Authorization (EUA). This EUA will remain in effect (meaning this test can be used) for the duration of the COVID-19 declaration under Section 564(b)(1) of the Act, 21 U.S.C. section 360bbb-3(b)(1), unless the authorization is terminated or revoked.  Performed at [facility], [place] [address], [place], [place] [address]   Culture, sputum-assessment     Status: None   Collection Time: 11/19/20 12:24 PM   Specimen: Sputum  Result Value Ref Range Status   Specimen Description EXPSU  Final   Special Requests Normal  Final   Sputum evaluation   Final    THIS SPECIMEN IS ACCEPTABLE FOR SPUTUM CULTURE Performed at [facility], [address] [place], [place] [address]    Report Status 11/19/2020 FINAL  Final  Culture, respiratory     Status: None   Collection Time: 11/19/20 12:24 PM  Result Value Ref Range Status   Specimen Description EXPSU  Final   Special Requests Normal Reflexed from [id number]  Final   Gram Stain   Final    ABUNDANT WBC PRESENT,  PREDOMINANTLY PMN RARE SQUAMOUS EPITHELIAL CELLS PRESENT MODERATE GRAM NEGATIVE RODS FEW GRAM POSITIVE COCCI IN PAIRS    Culture   Final    RARE Normal respiratory flora-no Staph aureus or Pseudomonas seen Performed at [facility], [place] [address] [place], [place] [address]    Report Status 11/21/2020 FINAL  Final  Body fluid culture     Status: None (Preliminary result)   Collection Time: 11/19/20  1:33 PM   Specimen: Pleura  Result Value Ref Range Status   Specimen Description PLEURAL FLUID  Final   Special Requests LEFT  Final   Gram Stain   Final    ABUNDANT WBC PRESENT, PREDOMINANTLY PMN NO ORGANISMS SEEN    Culture   Final    NO GROWTH 2 DAYS Performed at [facility], [place] [address] [place], [place] [address]    Report Status PENDING  Incomplete    Radiology Reports CT CHEST WO CONTRAST  Result Date: 11/22/2020 CLINICAL DATA:  70 year old male with history of abnormal chest x-ray. Apparent parapneumonic pleural effusion. EXAM: CT CHEST WITHOUT CONTRAST TECHNIQUE: Multidetector CT imaging of the chest was performed following the standard protocol without IV contrast. COMPARISON:  Chest CT 11/19/2020. FINDINGS: Cardiovascular: Heart size is normal. There is no significant pericardial fluid, thickening or pericardial calcification. There is aortic atherosclerosis, as well as atherosclerosis of the great vessels of the mediastinum and the coronary arteries, including calcified atherosclerotic plaque in the left main, left anterior descending and left circumflex coronary arteries. Mediastinum/Nodes: No pathologically enlarged mediastinal or hilar lymph nodes. Please note that  accurate exclusion of hilar adenopathy is limited on noncontrast CT scans. Esophagus is unremarkable in appearance. No axillary lymphadenopathy. Lungs/Pleura: Compared to the prior examination, there has been interval placement of a small bore pigtail drainage catheter into the left pleural space  posteriorly. The pleural fluid around the tip of the catheter has been evacuated when compared to the prior examination. Unfortunately, there continues to be a significant volume of loculated fluid in the left major fissure, particularly superiorly where the largest portion of this collection measures up to 7.3 x 4.4 x 6.5 cm (axial image 20 of series 3 and sagittal image 99 of series 8). Areas of developing scarring and/or atelectasis are noted in the periphery of the left lung. Trace volume of pneumothorax in the left pleural space, presumably iatrogenic related to indwelling chest tube. 5 mm left upper lobe pulmonary nodule (axial image 65 of series 6), stable compared to the recent prior examination, but increased in size compared to more remote prior study from 06/26/2020 (previously only 2 mm). 3 mm right middle lobe pulmonary nodule (axial image 88 of series 6), stable compared to prior study from 06/26/2020, nonspecific, but statistically likely benign. Right lung is otherwise clear. Upper Abdomen: Aortic atherosclerosis. Musculoskeletal: There are no aggressive appearing lytic or blastic lesions noted in the visualized portions of the skeleton. IMPRESSION: 1. Interval placement of pigtail drainage catheter in the left pleural space with residual loculated hydropneumothorax. Although the pleural catheter has successfully drained much of the fluid around the periphery of the left lung, there is a significant loculated component in the superior aspect of the left major fissure, as detailed above. 2. Small pulmonary nodules in the lungs bilaterally, most notable for a 5 mm left upper lobe pulmonary nodule. This is nonspecific and is stable compared to the most recent prior examination, but has increased in size compared to 06/26/2020. Attention on future follow-up imaging is recommended to ensure stability. 3. Aortic atherosclerosis, in addition to left main and 2 vessel coronary artery disease. Please note  that although the presence of coronary artery calcium documents the presence of coronary artery disease, the severity of this disease and any potential stenosis cannot be assessed on this non-gated CT examination. Assessment for potential risk factor modification, dietary therapy or pharmacologic therapy may be warranted, if clinically indicated. Aortic Atherosclerosis (ICD10-I70.0). Electronically Signed   By: Vinnie Langton M.D.   On: 11/22/2020 10:46   CT Angio Chest PE W and/or Wo Contrast  Result Date: 11/19/2020 CLINICAL DATA:  70 year old male status post fall yesterday striking left ribs again scar. Pain. Shallow inspiration. EXAM: CT ANGIOGRAPHY CHEST WITH CONTRAST TECHNIQUE: Multidetector CT imaging of the chest was performed using the standard protocol during bolus administration of intravenous contrast. Multiplanar CT image reconstructions and MIPs were obtained to evaluate the vascular anatomy. CONTRAST:  182mL OMNIPAQUE IOHEXOL 350 MG/ML SOLN COMPARISON:  Portable chest 0436 hours today. CTA chest 06/26/2020. 01/19/2012 CT Abdomen and Pelvis FINDINGS: Cardiovascular: Adequate contrast bolus timing in the pulmonary arterial tree. Respiratory motion, respiratory motion, pronounced at the lung bases and also the right hilum. No central pulmonary thrombus, but in segmental branches of the right upper lobe (series 6, image 101) and both lower lobes (series 6, image 154 and 161) there is suggestion of small volume nonocclusive clot. However, saddle and hilar embolus was demonstrated in September. Therefore, this is likely the residual of previous pulmonary embolus. Stable cardiac size at the upper limits of normal. No pericardial effusion. Negative visible aorta  aside from tortuosity and minimal plaque. Mediastinum/Nodes: Negative.  No mediastinal hematoma. Lungs/Pleura: Mostly loculated, minimally layering left pleural effusion with mildly hyperdense fluid loculated in the left lateral chest, but  low-density appearing fluid elsewhere. Some fluid trapped in the major fissure. Superimposed confluent left lower lobe heterogeneous consolidation, where the left lower lobe was clear in September. Left major airways remain patent. No left pneumothorax. Right lung is negative aside from mild atelectasis. Upper Abdomen: Negative visible liver, gallbladder, spleen, pancreas, and bowel. Stable left adrenal thickening/nodularity greater on the left. Density there is fairly low (12 Hounsfield units, and some macroscopic fat seems to be visible (series 5, image 85) and furthermore this has not significantly changed since CT Abdomen and Pelvis 01/19/2012, consistent with benign adenoma. Stable visible kidneys, benign-appearing left upper pole cyst. Musculoskeletal: No left-side rib fracture is identified. There is severe degeneration in the spine with S-shaped thoracolumbar scoliosis and chronic thoracolumbar junction laminectomy changes. Stable visualized osseous structures. Review of the MIP images confirms the above findings. IMPRESSION: 1. 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 2. Mixed loculated > layering left Pleural Effusion. Much of the fluid has low-density, but Hemothorax is difficult to exclude in the setting of trauma. However, no overlying rib fracture is identified. 3. Superimposed left lower lobe consolidation more suggestive of Pneumonia than Pulmonary Contusion. Pulmonary infarct persisting since September is unlikely. 4. Stable benign adrenal thickening, left adrenal adenoma. Electronically Signed   By: Genevie Ann M.D.   On: 11/19/2020 05:49   DG Chest Port 1 View  Result Date: 11/21/2020 CLINICAL DATA:  Chest tube. EXAM: PORTABLE CHEST 1 VIEW COMPARISON:  11/20/2020 FINDINGS: Pigtail type catheter overlies the LEFT lung base, stable in appearance. There is persistent opacity throughout the  LEFT LOWER lobe. Focal lucency along the LATERAL aspect of the LEFT hemithorax appears stable, consistent with small loculated pneumothorax. Loculated pleural effusion projects to the LEFT of the aortic knob, within the major fissure, seen on CT exam. Shallow lung inflation.  No pulmonary edema. IMPRESSION: 1. Persistent significant opacity throughout the LEFT LOWER lobe. 2. Persistent loculated pleural effusion. 3. Persistent small loculated pneumothorax. Electronically Signed   By: Nolon Nations M.D.   On: 11/21/2020 08:07   DG CHEST PORT 1 VIEW  Result Date: 11/20/2020 CLINICAL DATA:  70 year old male with recent fall, left pleural effusion and airspace disease. Status post CT-guided left chest tube placement yesterday. EXAM: PORTABLE CHEST 1 VIEW COMPARISON:  CTA chest 11/19/2020 and earlier. FINDINGS: Portable AP semi upright view at 0419 hours. Pigtail left chest tube now in place. Skin fold artifacts suspected in the left chest. No significant pneumothorax although there may be a small focus of loculated pleural gas laterally (arrow). Continued low lung volumes. Patchy and confluent left lung base opacity persists. Stable cardiac size and mediastinal contours. No overt edema. Scoliosis. Stable visualized osseous structures. Negative visible bowel gas pattern. IMPRESSION: 1. Left chest tube in place with no significant pneumothorax; trace loculated pleural gas laterally. 2. Continued low lung volumes and left lung base opacity. Electronically Signed   By: Genevie Ann M.D.   On: 11/20/2020 07:00   DG Chest Port 1 View  Result Date: 11/19/2020 CLINICAL DATA:  Chest pain post fall. Fall yesterday from car. Left-sided pain radiating to the back. EXAM: PORTABLE CHEST 1 VIEW COMPARISON:  Radiograph and CT 06/26/2020 FINDINGS: Low lung volumes. Upper normal heart size, unchanged from  prior exam. Aortic tortuosity is unchanged. Left pleural effusion which partially tracks laterally. Patchy retrocardiac opacity.  No visualized pneumothorax. Right lung is clear. Scoliotic curvature of the spine. No visualized left rib fractures. IMPRESSION: Left pleural effusion with patchy retrocardiac opacity, may be contusion in the setting of injury. No visualized pneumothorax or left rib fractures. Electronically Signed   By: Keith Rake M.D.   On: 11/19/2020 03:53   CT IMAGE GUIDED DRAINAGE BY PERCUTANEOUS CATHETER  Result Date: 11/19/2020 INDICATION: Left loculated effusion EXAM: CT-GUIDED POSTERIOR LEFT 10 FRENCH CHEST TUBE PLACEMENT MEDICATIONS: The patient is currently admitted to the hospital and receiving intravenous antibiotics. The antibiotics were administered within an appropriate time frame prior to the initiation of the procedure. ANESTHESIA/SEDATION: Fentanyl 50 mcg IV; Versed 1.0 mg IV Moderate Sedation Time:  11 MINUTES The patient was continuously monitored during the procedure by the interventional radiology nurse under my direct supervision. COMPLICATIONS: None immediate. PROCEDURE: Informed written consent was obtained from the patient after a thorough discussion of the procedural risks, benefits and alternatives. All questions were addressed. Maximal Sterile Barrier Technique was utilized including caps, mask, sterile gowns, sterile gloves, sterile drape, hand hygiene and skin antiseptic. A timeout was performed prior to the initiation of the procedure. previous imaging reviewed. patient position right side down decubitus. noncontrast localization ct performed. the left complex loculated effusion was localized and marked for a posterolateral approach. under sterile conditions and local anesthesia, an 18 gauge introducer needle was advanced from a lateral intercostal approach into the effusion. needle position confirmed with ct. syringe aspiration yielded serous pleural fluid. sample sent for culture. guidewire inserted followed by tract dilatation to insert a 10 french drain. drain catheter position  confirmed with ct. catheter secured with prolene suture and connected to external pleura vac. sterile dressing applied. no immediate complication. patient tolerated the procedure well. IMPRESSION: Successful CT-guided 10 French left chest tube insertion Electronically Signed   By: Jerilynn Mages.  Shick M.D.   On: 11/19/2020 13:52    Lab Data:  CBC: Recent Labs  Lab 11/19/20 0325 11/19/20 0419 11/20/20 0122 11/21/20 0200 11/22/20 0131  WBC 10.4  --  17.9* 14.9* 11.7*  NEUTROABS 8.4*  --  14.7*  --   --   HGB 12.4* 13.6 12.4* 11.6* 11.1*  HCT 39.0 40.0 38.3* 36.7* 33.4*  MCV 90.3  --  88.0 91.8 88.1  PLT 446*  --  460* 370 128   Basic Metabolic Panel: Recent Labs  Lab 11/19/20 0325 11/19/20 0419 11/20/20 0122 11/21/20 0718 11/22/20 0131  NA 136 140 138 140 137  K 3.3* 3.3* 3.1* 3.3* 3.4*  CL 99 101 101 99 101  CO2 26  --  26 22 25   GLUCOSE 158* 156* 177* 46* 141*  BUN 26* 31* 16 17 16   CREATININE 1.48* 1.40* 1.21 1.22 1.11  CALCIUM 9.9  --  9.5 9.7 9.1  MG  --   --   --  2.1  --   PHOS  --   --   --  2.5  --    GFR: Estimated Creatinine Clearance: 86.1 mL/min (by C-[name] formula based on SCr of 1.11 mg/dL). Liver Function Tests: No results for input(s): AST, ALT, ALKPHOS, BILITOT, PROT, ALBUMIN in the last 168 hours. No results for input(s): LIPASE, AMYLASE in the last 168 hours. No results for input(s): AMMONIA in the last 168 hours. Coagulation Profile: No results for input(s): INR, PROTIME in the last 168 hours. Cardiac Enzymes: No results for  input(s): CKTOTAL, CKMB, CKMBINDEX, TROPONINI in the last 168 hours. BNP (last 3 results) No results for input(s): PROBNP in the last 8760 hours. HbA1C: Recent Labs    11/21/20 0200  HGBA1C 6.2*   CBG: Recent Labs  Lab 11/21/20 1341 11/21/20 1744 11/21/20 2024 11/22/20 0724 11/22/20 1124  GLUCAP 123* 153* 120* 125* 102*   Lipid Profile: No results for input(s): CHOL, HDL, LDLCALC, TRIG, CHOLHDL, LDLDIRECT in the last 72  hours. Thyroid Function Tests: No results for input(s): TSH, T4TOTAL, FREET4, T3FREE, THYROIDAB in the last 72 hours. Anemia Panel: No results for input(s): VITAMINB12, FOLATE, FERRITIN, TIBC, IRON, RETICCTPCT in the last 72 hours. Urine analysis:    Component Value Date/Time   COLORURINE YELLOW 01/19/2019 1422   APPEARANCEUR HAZY (A) 01/19/2019 1422   LABSPEC 1.020 01/19/2019 1422   PHURINE 5.0 01/19/2019 1422   GLUCOSEU NEGATIVE 01/19/2019 1422   HGBUR MODERATE (A) 01/19/2019 1422   HGBUR negative 10/26/2010 0824   BILIRUBINUR NEGATIVE 01/19/2019 1422   BILIRUBINUR n 01/12/2019 1020   KETONESUR NEGATIVE 01/19/2019 1422   PROTEINUR 100 (A) 01/19/2019 1422   UROBILINOGEN 0.2 01/12/2019 1020   UROBILINOGEN 0.2 04/10/2013 1555   NITRITE NEGATIVE 01/19/2019 1422   LEUKOCYTESUR NEGATIVE 01/19/2019 1422     Kaoir Loree M.D. [facility] 11/22/2020, 3:25 PM   Call night coverage person covering after 7pm

## 2020-11-22 NOTE — Progress Notes (Signed)
NAME:  Keith [name], MRN:  [id number], DOB:  Aug 26, 1951, LOS: 3 ADMISSION DATE:  11/19/2020, CONSULTATION DATE:  2/12 REFERRING MD:  [facility], CHIEF COMPLAINT:  Dyspnea    Brief History:  19 to male who presented with cough, hemoptysis, shortness of breath, pleuritic left chest pain found to have loculated pleural effusions.  First dose of TPA and dornase 2/12.   History of Present Illness:  70 year old male with a well-documented past medical history who was in his usual state of medium health he is walker bound due to injuries to his left leg and back pain he has been on anticoagulants in the past but not currently taking any 5 days ago he started having fevers chills purulent sputum from cough.  He noted he was so uncomfortable he felt lightheaded.  2 days ago he fell hit the car door with the left side of his chest and in the posterior is noted excruciating pain since then.  He notes hemoptysis that started prior to the injury.  He was seen in the emergency room 11/19/2020 CT scan revealed a loculated left effusion and pulmonary critical care was called for chest tube insertion.  After reviewing the CT scan Dr. Carlis Abbott is asked interventional radiology to place the chest tube due to the positioning of the effusion posterior to the scapula.  Interventional radiology is aware.  Currently [name] is in no distress he is awake alert saturating well.  He has been fully vaccinated against Covid and is Covid testing is negative at this time.  Pulmonary critical care will continue to follow along should he need lytic therapy for his effusions as it is loculated  Past Medical History:  PE on anticoagulation Hypertension Esophageal stricture Type 2 diabetes Spinal stenosis  Significant Hospital Events:  Admitted 2/12  Consults:  Interventional radiology  Procedures:  CT-guided smallbore biopsy chest tube placed by interventional radiology 2/12  Significant Diagnostic Tests:  CTA chest >  negative for acute PE but positive for nonocclusive chronic PE.  Mixed loculated versus layering left pleural effusion.  Superimposed left lower lobe consolidation more suggestive of pneumonia versus contusion  Micro Data:  COVID 2/12 >  Sputum culture 2/12 > normal Blood cultures 2/12 > Pleural fluid culture 2/12 >  Antimicrobials:  Azithromycin 2/12 > Ceftriaxone 2/12 > Zosyn x1 Vancomycin x1   Interim History / Subjective:  Still having some pain. No new symptoms.  Objective   Blood pressure 110/74, pulse 86, temperature 98.7 F (37.1 C), temperature source Oral, resp. rate 18, height 6' (1.829 m), weight 122.5 kg, SpO2 96 %.        Intake/Output Summary (Last 24 hours) at 11/22/2020 0959 Last data filed at 11/22/2020 0901 Gross per 24 hour  Intake 240 ml  Output 950 ml  Net -710 ml   Filed Weights   11/19/20 0313  Weight: 122.5 kg    Examination: General: elderly man laying in bed in NAD HEENT: [place]/AT, eyes anicteric Neuro: alert, answering questions appropriately CV: S1S2, RRR PULM: breathing comfortably on [place], CTAB anteriorly. Chest tube without air leak, not tidaling. GI: soft, ND Extremities: chronic left foot deformity, no edema Skin: no rashes  Resolved Hospital Problem list     Assessment & Plan:  Loculated left effusion-- enlarging apical medial portion extending into fissure despite pleural lytics -Status post small bore chest tube placed by interventional radiology 2/12 -Status post treatment with TPA and dornase Community-acquired pneumonia- pneumococcus Status post mechanical fall  Hemoptysis due to  pneumonia P: -CT chest reviewed with patient -TCTS consult for recommendations- additional chest tube vs VATS vs prolonged course of antibiotics? -Con't chest tube for now. -Con't antibiotics  Possible pulmonary nodules- suspect these are infectious -will need outpatient follow up imaging 1-2 months after discharge  PCCM will continue to  follow.  Best practice (evaluated daily)  Per [facility]   Labs   CBC: Recent Labs  Lab 11/19/20 0325 11/19/20 0419 11/20/20 0122 11/21/20 0200 11/22/20 0131  WBC 10.4  --  17.9* 14.9* 11.7*  NEUTROABS 8.4*  --  14.7*  --   --   HGB 12.4* 13.6 12.4* 11.6* 11.1*  HCT 39.0 40.0 38.3* 36.7* 33.4*  MCV 90.3  --  88.0 91.8 88.1  PLT 446*  --  460* 370 937    Basic Metabolic Panel: Recent Labs  Lab 11/19/20 0325 11/19/20 0419 11/20/20 0122 11/21/20 0718 11/22/20 0131  NA 136 140 138 140 137  K 3.3* 3.3* 3.1* 3.3* 3.4*  CL 99 101 101 99 101  CO2 26  --  26 22 25   GLUCOSE 158* 156* 177* 46* 141*  BUN 26* 31* 16 17 16   CREATININE 1.48* 1.40* 1.21 1.22 1.11  CALCIUM 9.9  --  9.5 9.7 9.1  MG  --   --   --  2.1  --   PHOS  --   --   --  2.5  --    GFR: Estimated Creatinine Clearance: 86.1 mL/min (by C-G formula based on SCr of 1.11 mg/dL). Recent Labs  Lab 11/19/20 0325 11/19/20 1059 11/20/20 0122 11/21/20 0200 11/22/20 0131  PROCALCITON  --  0.14  --   --   --   WBC 10.4  --  17.9* 14.9* 11.7*    Liver Function Tests: No results for input(s): AST, ALT, ALKPHOS, BILITOT, PROT, ALBUMIN in the last 168 hours. No results for input(s): LIPASE, AMYLASE in the last 168 hours. No results for input(s): AMMONIA in the last 168 hours.  ABG    Component Value Date/Time   HCO3 23.8 06/27/2020 0419   TCO2 27 11/19/2020 0419   ACIDBASEDEF 2.0 06/27/2020 0419   O2SAT 69.0 06/27/2020 0419     Coagulation Profile: No results for input(s): INR, PROTIME in the last 168 hours.  Cardiac Enzymes: No results for input(s): CKTOTAL, CKMB, CKMBINDEX, TROPONINI in the last 168 hours.  HbA1C: Hgb A1c MFr Bld  Date/Time Value Ref Range Status  11/21/2020 02:00 AM 6.2 (H) 4.8 - 5.6 % Final    Comment:    (NOTE) Pre diabetes:          5.7%-6.4%  Diabetes:              >6.4%  Glycemic control for   <7.0% adults with diabetes   06/27/2020 08:00 PM 6.7 (H) 4.8 - 5.6 % Final     Comment:    (NOTE) Pre diabetes:          5.7%-6.4%  Diabetes:              >6.4%  Glycemic control for   <7.0% adults with diabetes     CBG: Recent Labs  Lab 11/21/20 0732 11/21/20 1341 11/21/20 1744 11/21/20 2024 11/22/20 [place] Mahogani Holohan, DO 11/22/20 11:36 AM [facility]  From 7AM- 7PM if no response to pager, please call [phone]. After hours, 7PM- 7AM, please call Elink  [phone].

## 2020-11-23 ENCOUNTER — Telehealth: Payer: Self-pay | Admitting: Critical Care Medicine

## 2020-11-23 DIAGNOSIS — J918 Pleural effusion in other conditions classified elsewhere: Secondary | ICD-10-CM | POA: Diagnosis not present

## 2020-11-23 DIAGNOSIS — J189 Pneumonia, unspecified organism: Secondary | ICD-10-CM | POA: Diagnosis not present

## 2020-11-23 DIAGNOSIS — J9601 Acute respiratory failure with hypoxia: Secondary | ICD-10-CM | POA: Diagnosis not present

## 2020-11-23 DIAGNOSIS — E876 Hypokalemia: Secondary | ICD-10-CM

## 2020-11-23 DIAGNOSIS — Z23 Encounter for immunization: Secondary | ICD-10-CM | POA: Diagnosis not present

## 2020-11-23 DIAGNOSIS — J90 Pleural effusion, not elsewhere classified: Secondary | ICD-10-CM

## 2020-11-23 DIAGNOSIS — J181 Lobar pneumonia, unspecified organism: Secondary | ICD-10-CM

## 2020-11-23 LAB — BASIC METABOLIC PANEL
Anion gap: 10 (ref 5–15)
BUN: 11 mg/dL (ref 8–23)
CO2: 26 mmol/L (ref 22–32)
Calcium: 9.5 mg/dL (ref 8.9–10.3)
Chloride: 101 mmol/L (ref 98–111)
Creatinine, Ser: 1.06 mg/dL (ref 0.61–1.24)
GFR, Estimated: >60 mL/min (ref >60)
Glucose, Bld: 111 mg/dL — ABNORMAL HIGH (ref 70–99)
Potassium: 3.3 mmol/L — ABNORMAL LOW (ref 3.5–5.1)
Sodium: 137 mmol/L (ref 135–145)

## 2020-11-23 LAB — CBC
HCT: 31.3 % — ABNORMAL LOW (ref 39.0–52.0)
Hemoglobin: 10.3 g/dL — ABNORMAL LOW (ref 13.0–17.0)
MCH: 29 pg (ref 26.0–34.0)
MCHC: 32.9 g/dL (ref 30.0–36.0)
MCV: 88.2 fL (ref 80.0–100.0)
Platelets: 414 10*3/uL — ABNORMAL HIGH (ref 150–400)
RBC: 3.55 MIL/uL — ABNORMAL LOW (ref 4.22–5.81)
RDW: 15.3 % (ref 11.5–15.5)
WBC: 9.5 10*3/uL (ref 4.0–10.5)
nRBC: 0 % (ref 0.0–0.2)

## 2020-11-23 LAB — GLUCOSE, CAPILLARY
Glucose-Capillary: 71 mg/dL (ref 70–99)
Glucose-Capillary: 126 mg/dL — ABNORMAL HIGH (ref 70–99)
Glucose-Capillary: 91 mg/dL (ref 70–99)
Glucose-Capillary: 123 mg/dL — ABNORMAL HIGH (ref 70–99)

## 2020-11-23 LAB — SARS CORONAVIRUS 2 (TAT 6-24 HRS): SARS Coronavirus 2: NEGATIVE

## 2020-11-23 MED ORDER — POTASSIUM CHLORIDE CRYS ER 20 MEQ PO TBCR
40.0000 meq | EXTENDED_RELEASE_TABLET | Freq: Once | ORAL | Status: AC
  Administered 2020-11-23: 40 meq via ORAL
  Filled 2020-11-23: qty 2

## 2020-11-23 MED ORDER — PNEUMOCOCCAL 13-VAL CONJ VACC IM SUSP
0.5000 mL | INTRAMUSCULAR | Status: AC
  Administered 2020-11-24: 0.5 mL via INTRAMUSCULAR
  Filled 2020-11-23: qty 0.5

## 2020-11-23 MED ORDER — POTASSIUM CHLORIDE CRYS ER 20 MEQ PO TBCR
40.0000 meq | EXTENDED_RELEASE_TABLET | Freq: Every day | ORAL | Status: DC
  Administered 2020-11-24: 40 meq via ORAL
  Filled 2020-11-23: qty 2

## 2020-11-23 NOTE — Consult Note (Addendum)
[facility] Face-to-Face Psychiatry Consult   Reason for Consult:  70 year old male with debility,,Pneumonia, complicated with effusion, hemothorax, chest tube, evaluate for depression Referring Physician: Dr. Tana Coast Patient Identification: [name] MRN:  [id number] Principal Diagnosis: CAP (community acquired pneumonia) Diagnosis:  Principal Problem:   CAP (community acquired pneumonia) Active Problems:   Class 2 obesity due to excess calories with body mass index (BMI) of 36.0 to 36.9 in adult   Essential hypertension   AKI (acute kidney injury) ([place])   Hyperlipidemia associated with type 2 diabetes mellitus ([place])   Type 2 diabetes mellitus with diabetic neuropathy, unspecified ([place])   Chronic pain syndrome   Pleural effusion   DNR (do not resuscitate)   Total Time spent with patient: 30 minutes  Subjective:   Keith [name] is a 70 y.o. male patient admitted with cough, chest pain, hemoptysis, and recent fall.  Psychiatric consult for evaluation of depression.  Patient is seen and case discussed with Dr. Dwyane Dee.  Patient states he is feeling much better today, and just needed some time to process everything that has happened to him.  He reports within the past 2 years he has had major back surgery, fracture his ankle, developed a pulmonary embolism, lost a family member, and has subsequently developed pneumonia resulting in this hospitalization.  He denies any previous psychiatric history.  He denies any current or previous psychiatric behavioral health services.  He denies any previous inpatient history.  He denies any current or recent substance abuse, and or legal charges.  He denies any suicidal ideations, homicidal ideations, and or auditory visual hallucinations.  Patient is alert and oriented, calm and cooperative, and engages well with Probation officer.  Patient is very circumstantial, and shows willingness to talk and participate in evaluation.  He does decline psychiatric evaluation, noting  that it is not necessary.  He further reports he will be able to establish services at the [place], although he feels this is also unnecessary.  He does eventually agree to some form of outpatient therapy, to help process the loss of his brother, and ongoing medical conditions.  He is currently retired from 2 jobs, he lives at home with his wife, and reports his support system as his wife, 5 children, and 14 grandchildren.  He has multiple protective factors, and declines any history of suicidal thoughts.   HPI: Patient is a 70 year old male with history of hypertension, diabetes mellitus, chronic back pain, PE in 06/2020, has completed anticoagulation, ambulates with a walker presented with cough, chest pain, hemoptysis and fall. Per patient it started 5 days ago when he went out without a coat. When he returned, he started having cold-like symptoms, then fevers chills, purulent sputum from cough. On 11/18/2020, he fell and hit the car door with the left side of the chest and posterior, since then having excruciating pain. He also noticed hemoptysis that started prior to the injury.  On admission, CT chest showed loculated left-sided effusion. CCM was consulted and patient was admitted for further work-up. Patient is fully vaccinated for Covid   Past Psychiatric History: Denies  Risk to Self:  Denies Risk to Others:  Denies Prior Inpatient Therapy:  Denies Prior Outpatient Therapy:  Denies  Past Medical History:  Past Medical History:  Diagnosis Date  . Allergy   . Chronic back pain   . Diabetes mellitus without complication ([place])   . DNR (do not resuscitate) 11/19/2020  . Duodenal ulcer hemorrhage 08/29/2014  . ED (erectile dysfunction)   .  Esophageal stricture 08/30/2014  . Glucose intolerance (impaired glucose tolerance)   . Hiatal hernia 08/30/2014  . Hypertension   . Hypokalemia 04/11/2013  . 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 Obesity   . Osteoarthritis   . Pulmonary embolism ([place]) 06/2020  .  Scoliosis 2016  . Spinal stenosis of lumbar region   . Urinary tract infection 04/11/2013    Past Surgical History:  Procedure Laterality Date  . COLONOSCOPY  2008  . ESOPHAGOGASTRODUODENOSCOPY N/A 08/29/2014   Procedure: ESOPHAGOGASTRODUODENOSCOPY (EGD);  Surgeon: Lafayette Dragon, MD;  Location: [facility];  Service: Endoscopy;  Laterality: N/A;  . LAMINECTOMY N/A 07/28/2018   Procedure: THORACIC ELEVEN- THORACIC TWELVE POSTERIOR DECOMPRESSION LAMINECTOMY;  Surgeon: Judith Part, MD;  Location: [facility];  Service: Neurosurgery;  Laterality: N/A;  . LUMBAR LAMINECTOMY    . NASAL POLYP SURGERY    . ORIF ANKLE FRACTURE Right 07/01/2020   Procedure: OPEN REDUCTION INTERNAL FIXATION (ORIF) ANKLE FRACTURE. TRIMALLEOLAR;  Surgeon: Marchia Bond, MD;  Location: [facility];  Service: Orthopedics;  Laterality: Right;  . TONSILLECTOMY     Family History:  Family History  Problem Relation Age of Onset  . Diabetes Mother   . Asthma Mother   . Hypertension Father   . Stroke Father   . Colon cancer Neg Hx    Family Psychiatric  History: Denies Social History:  Social History   Substance and Sexual Activity  Alcohol Use Yes   Comment: holidays and special occasions     Social History   Substance and Sexual Activity  Drug Use No    Social History   Socioeconomic History  . Marital status: Married    Spouse name: Not on file  . Number of children: Not on file  . Years of education: Not on file  . Highest education level: Not on file  Occupational History  . Occupation: retired  Tobacco Use  . Smoking status: Former Smoker    Packs/day: 1.00    Years: 25.00    Pack years: 25.00    Quit date: 1995    Years since quitting: 27.1  . Smokeless tobacco: Never Used  Vaping Use  . Vaping Use: Never used  Substance and Sexual Activity  . Alcohol use: Yes    Comment: holidays and special occasions  . Drug use: No  . Sexual activity: Yes    Birth control/protection: None  Other  Topics Concern  . Not on file  Social History Narrative   Married.     Social Determinants of Health   Financial Resource Strain: Not on file  Food Insecurity: Not on file  Transportation Needs: Not on file  Physical Activity: Not on file  Stress: Not on file  Social Connections: Not on file   Additional Social History:    Allergies:   Allergies  Allergen Reactions  . Aspirin Other (See Comments)    Irritates the stomach and the patient developed ulcers, also  . Lisinopril Other (See Comments)    Caused a body ache    Labs:  Results for orders placed or performed during the hospital encounter of 11/19/20 (from the past 48 hour(s))  Glucose, capillary     Status: Abnormal   Collection Time: 11/21/20  1:41 PM  Result Value Ref Range   Glucose-Capillary 123 (H) 70 - 99 mg/dL    Comment: Glucose reference range applies only to samples taken after fasting for at least 8 hours.  Glucose, capillary     Status: Abnormal  Collection Time: 11/21/20  5:44 PM  Result Value Ref Range   Glucose-Capillary 153 (H) 70 - 99 mg/dL    Comment: Glucose reference range applies only to samples taken after fasting for at least 8 hours.  Glucose, capillary     Status: Abnormal   Collection Time: 11/21/20  8:24 PM  Result Value Ref Range   Glucose-Capillary 120 (H) 70 - 99 mg/dL    Comment: Glucose reference range applies only to samples taken after fasting for at least 8 hours.  CBC     Status: Abnormal   Collection Time: 11/22/20  1:31 AM  Result Value Ref Range   WBC 11.7 (H) 4.0 - 10.5 K/uL   RBC 3.79 (L) 4.22 - 5.81 MIL/uL   Hemoglobin 11.1 (L) 13.0 - 17.0 g/dL   HCT 33.4 (L) 39.0 - 52.0 %   MCV 88.1 80.0 - 100.0 fL   MCH 29.3 26.0 - 34.0 pg   MCHC 33.2 30.0 - 36.0 g/dL   RDW 15.5 11.5 - 15.5 %   Platelets 393 150 - 400 K/uL   nRBC 0.0 0.0 - 0.2 %    Comment: Performed at [facility], [place] [address] [place], [place] [address]  Basic metabolic panel     Status: Abnormal    Collection Time: 11/22/20  1:31 AM  Result Value Ref Range   Sodium 137 135 - 145 mmol/L   Potassium 3.4 (L) 3.5 - 5.1 mmol/L   Chloride 101 98 - 111 mmol/L   CO2 25 22 - 32 mmol/L   Glucose, Bld 141 (H) 70 - 99 mg/dL    Comment: Glucose reference range applies only to samples taken after fasting for at least 8 hours.   BUN 16 8 - 23 mg/dL   Creatinine, Ser 1.11 0.61 - 1.24 mg/dL   Calcium 9.1 8.9 - 10.3 mg/dL   GFR, Estimated >60 >60 mL/min    Comment: (NOTE) Calculated using the CKD-EPI Creatinine Equation (2021)    Anion gap 11 5 - 15    Comment: Performed at [facility] [address] [place], [place] [address]  Glucose, capillary     Status: Abnormal   Collection Time: 11/22/20  7:24 AM  Result Value Ref Range   Glucose-Capillary 125 (H) 70 - 99 mg/dL    Comment: Glucose reference range applies only to samples taken after fasting for at least 8 hours.  Glucose, capillary     Status: Abnormal   Collection Time: 11/22/20 11:24 AM  Result Value Ref Range   Glucose-Capillary 102 (H) 70 - 99 mg/dL    Comment: Glucose reference range applies only to samples taken after fasting for at least 8 hours.  Glucose, capillary     Status: Abnormal   Collection Time: 11/22/20  5:05 PM  Result Value Ref Range   Glucose-Capillary 121 (H) 70 - 99 mg/dL    Comment: Glucose reference range applies only to samples taken after fasting for at least 8 hours.  Glucose, capillary     Status: Abnormal   Collection Time: 11/22/20  9:29 PM  Result Value Ref Range   Glucose-Capillary 137 (H) 70 - 99 mg/dL    Comment: Glucose reference range applies only to samples taken after fasting for at least 8 hours.  CBC     Status: Abnormal   Collection Time: 11/23/20  1:55 AM  Result Value Ref Range   WBC 9.5 4.0 - 10.5 K/uL   RBC 3.55 (L)  4.22 - 5.81 MIL/uL   Hemoglobin 10.3 (L) 13.0 - 17.0 g/dL   HCT 31.3 (L) 39.0 - 52.0 %   MCV 88.2 80.0 - 100.0 fL   MCH 29.0 26.0 - 34.0 pg   MCHC 32.9 30.0 -  36.0 g/dL   RDW 15.3 11.5 - 15.5 %   Platelets 414 (H) 150 - 400 K/uL   nRBC 0.0 0.0 - 0.2 %    Comment: Performed at [facility] [address], [place], [place] [address]  Basic metabolic panel     Status: Abnormal   Collection Time: 11/23/20  1:55 AM  Result Value Ref Range   Sodium 137 135 - 145 mmol/L   Potassium 3.3 (L) 3.5 - 5.1 mmol/L   Chloride 101 98 - 111 mmol/L   CO2 26 22 - 32 mmol/L   Glucose, Bld 111 (H) 70 - 99 mg/dL    Comment: Glucose reference range applies only to samples taken after fasting for at least 8 hours.   BUN 11 8 - 23 mg/dL   Creatinine, Ser 1.06 0.61 - 1.24 mg/dL   Calcium 9.5 8.9 - 10.3 mg/dL   GFR, Estimated >60 >60 mL/min    Comment: (NOTE) Calculated using the CKD-EPI Creatinine Equation (2021)    Anion gap 10 5 - 15    Comment: Performed at [facility] [address] [place], [place] [address]  Glucose, capillary     Status: None   Collection Time: 11/23/20  7:50 AM  Result Value Ref Range   Glucose-Capillary 71 70 - 99 mg/dL    Comment: Glucose reference range applies only to samples taken after fasting for at least 8 hours.  Glucose, capillary     Status: Abnormal   Collection Time: 11/23/20 12:09 PM  Result Value Ref Range   Glucose-Capillary 126 (H) 70 - 99 mg/dL    Comment: Glucose reference range applies only to samples taken after fasting for at least 8 hours.    Current Facility-Administered Medications  Medication Dose Route Frequency Provider Last Rate Last Admin  . 0.9 %  sodium chloride infusion   Intravenous Continuous Karmen Bongo, MD 75 mL/hr at 11/22/20 1732 New Bag at 11/22/20 1732  . acetaminophen (TYLENOL) tablet 650 mg  650 mg Oral Q6H PRN Karmen Bongo, MD      . alum & mag hydroxide-simeth (MAALOX/MYLANTA) 200-200-20 MG/5ML suspension 15-30 mL  15-30 mL Oral TID PRN Karmen Bongo, MD   30 mL at 11/22/20 0800  . atenolol (TENORMIN) tablet 50 mg  50 mg Oral Daily Karmen Bongo, MD   50 mg at  11/23/20 1033   And  . chlorthalidone (HYGROTON) tablet 25 mg  25 mg Oral Daily Karmen Bongo, MD   25 mg at 11/23/20 1033  . azithromycin (ZITHROMAX) 500 mg in sodium chloride 0.9 % 250 mL IVPB  500 mg Intravenous Q24H Florencia Reasons, MD 250 mL/hr at 11/22/20 1456 500 mg at 11/22/20 1456  . cefTRIAXone (ROCEPHIN) 2 g in sodium chloride 0.9 % 100 mL IVPB  2 g Intravenous Q24H Florencia Reasons, MD 200 mL/hr at 11/23/20 1239 2 g at 11/23/20 1239  . cloNIDine (CATAPRES) tablet 0.1 mg  0.1 mg Oral Daily Karmen Bongo, MD   0.1 mg at 11/23/20 1032  . docusate sodium (COLACE) capsule 100 mg  100 mg Oral BID Rai, Ripudeep K, MD   100 mg at 11/23/20 1034  . DULoxetine (CYMBALTA) DR capsule 60 mg  60 mg  Oral Daily Karmen Bongo, MD   60 mg at 11/23/20 1032  . guaiFENesin-codeine 100-10 MG/5ML solution 10 mL  10 mL Oral Q4H PRN Karmen Bongo, MD      . HYDROmorphone (DILAUDID) injection 1 mg  1 mg Intravenous Q4H PRN Noemi Chapel P, DO   1 mg at 11/21/20 1409  . insulin aspart (novoLOG) injection 0-5 Units  0-5 Units Subcutaneous QHS Karmen Bongo, MD      . insulin aspart (novoLOG) injection 0-9 Units  0-9 Units Subcutaneous TID WC Rai, Ripudeep K, MD   1 Units at 11/22/20 1727  . lidocaine (LIDODERM) 5 % 1 patch  1 patch Transdermal Q24H Karmen Bongo, MD   1 patch at 11/23/20 1030  . morphine 2 MG/ML injection 2 mg  2 mg Intravenous Q2H PRN Karmen Bongo, MD   2 mg at 11/20/20 0128  . multivitamin with minerals tablet 1 tablet  1 tablet Oral Daily Karmen Bongo, MD   1 tablet at 11/23/20 1031  . ondansetron (ZOFRAN) injection 4 mg  4 mg Intravenous Q6H PRN Karmen Bongo, MD   4 mg at 11/19/20 1616  . oxyCODONE (Oxy IR/ROXICODONE) immediate release tablet 7.5-10 mg  7.5-10 mg Oral Q6H PRN Marianna Payment, MD   10 mg at 11/23/20 0814  . [START ON 11/24/2020] pneumococcal 13-valent conjugate vaccine (PREVNAR 13) injection 0.5 mL  0.5 mL Intramuscular Tomorrow-1000 Noemi Chapel P, DO      . polyethylene  glycol (MIRALAX / GLYCOLAX) packet 17 g  17 g Oral Daily PRN Rai, Ripudeep K, MD      . potassium chloride SA (KLOR-CON) CR tablet 20 mEq  20 mEq Oral Daily Karmen Bongo, MD   20 mEq at 11/23/20 1031  . simvastatin (ZOCOR) tablet 40 mg  40 mg Oral Daily Karmen Bongo, MD   40 mg at 11/23/20 1032    Musculoskeletal: Strength & Muscle Tone: within normal limits Gait & Station: UA Patient leans: N/A  Psychiatric Specialty Exam: Physical Exam  Review of Systems  Blood pressure 128/77, pulse 73, temperature 98.2 F (36.8 C), resp. rate 20, height 6' (1.829 m), weight 122.5 kg, SpO2 97 %.Body mass index is 36.62 kg/m.  General Appearance: Casual and Neat  Eye Contact:  Fair  Speech:  Clear and Coherent and Normal Rate  Volume:  Normal  Mood:  Euthymic  Affect:  Appropriate and Congruent  Thought Process:  Coherent, Goal Directed, Linear and Descriptions of Associations: Circumstantial  Orientation:  Full (Time, Place, and Person)  Thought Content:  WDL  Suicidal Thoughts:  No  Homicidal Thoughts:  No  Memory:  Immediate;   Good Recent;   Good Remote;   Good  Judgement:  Good  Insight:  Good  Psychomotor Activity:  Normal  Concentration:  Concentration: Good and Attention Span: Good  Recall:  Good  Fund of Knowledge:  Good  Language:  Good  Akathisia:  No  Handed:  Right  AIMS (if indicated):     Assets:  Communication Skills Desire for Improvement Financial Resources/Insurance Housing Intimacy Leisure Time Resilience Social Support Talents/Skills Transportation Vocational/Educational  ADL's:  Intact  Cognition:  WNL  Sleep:        Treatment Plan Summary: Plan Patient declines the need for psychiatric services.  However does visit today in the evaluation.  His current level of psychiatric services could benefit from outpatient therapy.  He denies any acute stress reaction, therefore will not benefit from any medication initiation during this  hospitalization.  -  Outpatient follow-up information placed in AVS, if patient chooses to follow-up. -As noted patient denies any current acute stress reaction, depressive symptoms, anxiety, mania, psychosis, and or suicidal thoughts that would need initiation of medication and or inpatient psychiatric services. At current we are unable to offer inpatient therapeutic services at this time.  -Psychiatry to sign off at this time.  -The above information has been communicated with Dr. Erlinda Hong, and Dr. Dwyane Dee. Disposition: No evidence of imminent risk to self or others at present.   Patient does not meet criteria for psychiatric inpatient admission. Supportive therapy provided about ongoing stressors.  Suella Broad, FNP 11/23/2020 1:32 PM

## 2020-11-23 NOTE — NC FL2 (Signed)
[place] LEVEL OF CARE SCREENING TOOL     IDENTIFICATION  Patient Name: Keith [name] Birthdate: Dec 31, 1950 Sex: male Admission Date (Current Location): 11/19/2020  [facility] and [place] Number:  Herbalist and Address:  [facility], [place] [address] [place], [place] [address]      Provider Number: [id number]  Attending Physician Name and Address:  Florencia Reasons, MD  Relative Name and Phone Number:       Current Level of Care: Hospital Recommended Level of Care: [place] Prior Approval Number:    Date Approved/Denied:   PASRR Number: [id number]  Discharge Plan: SNF    Current Diagnoses: Patient Active Problem List   Diagnosis Date Noted  . CAP (community acquired pneumonia) 11/19/2020  . Loculated pleural effusion 11/19/2020  . DNR (do not resuscitate) 11/19/2020  . Closed right ankle fracture   . Labile blood glucose   . Hypokalemia   . Drug induced constipation   . Debility   . Hyponatremia   . Diabetic peripheral neuropathy ([place])   . Acute blood loss anemia   . Chronic pain syndrome   . Postoperative pain   . Pulmonary emboli ([place]) 07/04/2020  . Closed right trimalleolar fracture 07/02/2020  . Acute respiratory failure with hypoxia ([place])   . Acute saddle pulmonary embolism with acute cor pulmonale ([place]) 06/26/2020  . Lumbar spinal stenosis 06/01/2020  . Spinal cord compression ([place]) 04/02/2020  . Thoracic myelopathy 07/25/2018  . Hyperlipidemia associated with type 2 diabetes mellitus ([place]) 11/14/2016  . Type 2 diabetes mellitus with diabetic neuropathy, unspecified ([place]) 11/14/2016  . Routine general medical examination at a health care facility 11/14/2015  . Hiatal hernia 08/30/2014  . AKI (acute kidney injury) ([place]) 04/10/2013  . Allergic rhinitis 04/10/2013  . Spinal stenosis of lumbar region 11/06/2012  . Allergic rhinitis due to pollen 01/15/2011  . Osteoarthritis 11/02/2008  . HIP  PAIN, LEFT 02/09/2008  . Class 2 obesity due to excess calories with body mass index (BMI) of 36.0 to 36.9 in adult 11/07/2007  . ERECTILE DYSFUNCTION, MILD 11/07/2007  . Essential hypertension 11/07/2007    Orientation RESPIRATION BLADDER Height & Weight     Self,Time,Situation,Place  Normal,O2 (See discharge summary) Continent Weight: 270 lb (122.5 kg) Height:  6' (182.9 cm)  BEHAVIORAL SYMPTOMS/MOOD NEUROLOGICAL BOWEL NUTRITION STATUS      Continent Diet (See discharge summary)  AMBULATORY STATUS COMMUNICATION OF NEEDS Skin   Extensive Assist Verbally Normal,Skin abrasions                       Personal Care Assistance Level of Assistance  Bathing,Feeding,Dressing Bathing Assistance: Maximum assistance Feeding assistance: Independent Dressing Assistance: Maximum assistance     Functional Limitations Info  Sight,Hearing,Speech Sight Info: Adequate Hearing Info: Adequate Speech Info: Adequate    SPECIAL CARE FACTORS FREQUENCY  PT (By licensed PT),OT (By licensed OT)     PT Frequency: 5x a week OT Frequency: 5x a week            Contractures Contractures Info: Not present    Additional Factors Info  Code Status,Allergies Code Status Info: DNR Allergies Info: asprin, lisinpril           Current Medications (11/23/2020):  This is the current hospital active medication list Current Facility-Administered Medications  Medication Dose Route Frequency Provider Last Rate Last Admin  . 0.9 %  sodium chloride infusion   Intravenous Continuous Karmen Bongo, MD  75 mL/hr at 11/22/20 1732 New Bag at 11/22/20 1732  . acetaminophen (TYLENOL) tablet 650 mg  650 mg Oral Q6H PRN Karmen Bongo, MD      . alum & mag hydroxide-simeth (MAALOX/MYLANTA) 200-200-20 MG/5ML suspension 15-30 mL  15-30 mL Oral TID PRN Karmen Bongo, MD   30 mL at 11/22/20 0800  . atenolol (TENORMIN) tablet 50 mg  50 mg Oral Daily Karmen Bongo, MD   50 mg at 11/23/20 1033   And  .  chlorthalidone (HYGROTON) tablet 25 mg  25 mg Oral Daily Karmen Bongo, MD   25 mg at 11/23/20 1033  . azithromycin (ZITHROMAX) 500 mg in sodium chloride 0.9 % 250 mL IVPB  500 mg Intravenous Q24H Florencia Reasons, MD 250 mL/hr at 11/23/20 1343 500 mg at 11/23/20 1343  . cefTRIAXone (ROCEPHIN) 2 g in sodium chloride 0.9 % 100 mL IVPB  2 g Intravenous Q24H Florencia Reasons, MD 200 mL/hr at 11/23/20 1239 2 g at 11/23/20 1239  . cloNIDine (CATAPRES) tablet 0.1 mg  0.1 mg Oral Daily Karmen Bongo, MD   0.1 mg at 11/23/20 1032  . docusate sodium (COLACE) capsule 100 mg  100 mg Oral BID Rai, Ripudeep K, MD   100 mg at 11/23/20 1034  . DULoxetine (CYMBALTA) DR capsule 60 mg  60 mg Oral Daily Karmen Bongo, MD   60 mg at 11/23/20 1032  . guaiFENesin-codeine 100-10 MG/5ML solution 10 mL  10 mL Oral Q4H PRN Karmen Bongo, MD      . HYDROmorphone (DILAUDID) injection 1 mg  1 mg Intravenous Q4H PRN Noemi Chapel P, DO   1 mg at 11/21/20 1409  . insulin aspart (novoLOG) injection 0-5 Units  0-5 Units Subcutaneous QHS Karmen Bongo, MD      . insulin aspart (novoLOG) injection 0-9 Units  0-9 Units Subcutaneous TID WC Rai, Ripudeep K, MD   1 Units at 11/22/20 1727  . lidocaine (LIDODERM) 5 % 1 patch  1 patch Transdermal Q24H Karmen Bongo, MD   1 patch at 11/23/20 1030  . morphine 2 MG/ML injection 2 mg  2 mg Intravenous Q2H PRN Karmen Bongo, MD   2 mg at 11/20/20 0128  . multivitamin with minerals tablet 1 tablet  1 tablet Oral Daily Karmen Bongo, MD   1 tablet at 11/23/20 1031  . ondansetron (ZOFRAN) injection 4 mg  4 mg Intravenous Q6H PRN Karmen Bongo, MD   4 mg at 11/19/20 1616  . oxyCODONE (Oxy IR/ROXICODONE) immediate release tablet 7.5-10 mg  7.5-10 mg Oral Q6H PRN Marianna Payment, MD   10 mg at 11/23/20 1536  . [START ON 11/24/2020] pneumococcal 13-valent conjugate vaccine (PREVNAR 13) injection 0.5 mL  0.5 mL Intramuscular Tomorrow-1000 Noemi Chapel P, DO      . polyethylene glycol (MIRALAX / GLYCOLAX)  packet 17 g  17 g Oral Daily PRN Rai, Ripudeep K, MD      . Derrill Memo ON 11/24/2020] potassium chloride SA (KLOR-CON) CR tablet 40 mEq  40 mEq Oral Daily Florencia Reasons, MD      . simvastatin (ZOCOR) tablet 40 mg  40 mg Oral Daily Karmen Bongo, MD   40 mg at 11/23/20 1032     Discharge Medications: Please see discharge summary for a list of discharge medications.  Relevant Imaging Results:  Relevant Lab Results:   Additional Information SSN: [id number]  Emeterio Reeve, [place]

## 2020-11-23 NOTE — TOC Initial Note (Signed)
Transition of Care [facility]) - Initial/Assessment Note    Patient Details  Name: Keith [name] MRN: [id number] Date of Birth: 05-Sep-1951  Transition of Care [facility]) CM/SW Contact:    Emeterio Reeve, [place] Phone Number: 11/23/2020, 4:00 PM  Clinical Narrative:                  CSW met with pt at bedside. CSW introduced self and explained her role at the hospital.  Pt reports PTA he was living at home with his wife. Pt reports he was independent with mobility and ADL's. Pt reports he is covid vaccinated.   CSW reviewed pt/ot reccs for CIR/ SNF. CSW informed pt that due to his diagnosis his insurance may not pay for CIR and SNF may be another option. Pt was agreeable to SNF and says he was at a SNF last April but is unsure which one. Pt stated if his insurance will pay for it he will go. Pt has no SNf preference at this time.   TOC will follow.   Expected Discharge Plan: Skilled Nursing Facility Barriers to Discharge: Insurance Authorization,Continued Medical Work up   Patient Goals and CMS Choice Patient states their goals for this hospitalization and ongoing recovery are:: get strong enough to return home CMS Medicare.gov Compare Post Acute Care list provided to:: Patient Choice offered to / list presented to : Patient  Expected Discharge Plan and Services Expected Discharge Plan: [place]       Living arrangements for the past 2 months: Single Family Home                                      Prior Living Arrangements/Services Living arrangements for the past 2 months: Single Family Home Lives with:: Spouse Patient language and need for interpreter reviewed:: Yes Do you feel safe going back to the place where you live?: Yes      Need for Family Participation in Patient Care: Yes (Comment) Care giver support system in place?: Yes (comment) Current home services: DME Criminal Activity/Legal Involvement Pertinent to Current Situation/Hospitalization: No  - Comment as needed  Activities of Daily Living Home Assistive Devices/Equipment: Engineer, drilling (specify type) ADL Screening (condition at time of admission) Patient's cognitive ability adequate to safely complete daily activities?: Yes Is the patient deaf or have difficulty hearing?: No Does the patient have difficulty seeing, even when wearing glasses/contacts?: No Does the patient have difficulty concentrating, remembering, or making decisions?: No Patient able to express need for assistance with ADLs?: Yes Does the patient have difficulty dressing or bathing?: No Independently performs ADLs?: Yes (appropriate for developmental age) Does the patient have difficulty walking or climbing stairs?: Yes Weakness of Legs: Both Weakness of Arms/Hands: None  Permission Sought/Granted Permission sought to share information with : Chartered certified accountant granted to share information with : Yes, Verbal Permission Granted     Permission granted to share info w AGENCY: SNF        Emotional Assessment Appearance:: Appears stated age Attitude/Demeanor/Rapport: Engaged Affect (typically observed): Appropriate Orientation: : Oriented to Self,Oriented to Place,Oriented to  Time,Oriented to Situation Alcohol / Substance Use: Not Applicable Psych Involvement: No (comment)  Admission diagnosis:  Pleural effusion [J90] CAP (community acquired pneumonia) [J18.9] Loculated pleural effusion [J90] Patient Active Problem List   Diagnosis Date Noted  . CAP (community acquired pneumonia) 11/19/2020  . Loculated pleural effusion 11/19/2020  .  DNR (do not resuscitate) 11/19/2020  . Closed right ankle fracture   . Labile blood glucose   . Hypokalemia   . Drug induced constipation   . Debility   . Hyponatremia   . Diabetic peripheral neuropathy ([place])   . Acute blood loss anemia   . Chronic pain syndrome   . Postoperative pain   . Pulmonary emboli ([place]) 07/04/2020  . Closed  right trimalleolar fracture 07/02/2020  . Acute respiratory failure with hypoxia ([place])   . Acute saddle pulmonary embolism with acute cor pulmonale ([place]) 06/26/2020  . Lumbar spinal stenosis 06/01/2020  . Spinal cord compression ([place]) 04/02/2020  . Thoracic myelopathy 07/25/2018  . Hyperlipidemia associated with type 2 diabetes mellitus ([place]) 11/14/2016  . Type 2 diabetes mellitus with diabetic neuropathy, unspecified ([place]) 11/14/2016  . Routine general medical examination at a health care facility 11/14/2015  . Hiatal hernia 08/30/2014  . AKI (acute kidney injury) ([place]) 04/10/2013  . Allergic rhinitis 04/10/2013  . Spinal stenosis of lumbar region 11/06/2012  . Allergic rhinitis due to pollen 01/15/2011  . Osteoarthritis 11/02/2008  . HIP PAIN, LEFT 02/09/2008  . Class 2 obesity due to excess calories with body mass index (BMI) of 36.0 to 36.9 in adult 11/07/2007  . ERECTILE DYSFUNCTION, MILD 11/07/2007  . Essential hypertension 11/07/2007   PCP:  [name], [name], MD Pharmacy:   [id number] [place], [place] [address]Phone: [phone]: [phone]    Social Determinants of Health (SDOH) Interventions    Readmission Risk Interventions No flowsheet data found.  MEmeterio Reeve LLatanya Presser LIdamaySocial Worker [phone]

## 2020-11-23 NOTE — Progress Notes (Signed)
Physical Therapy Treatment Patient Details Name: Keith [name] MRN: [id number] DOB: 09-12-1951 Today's Date: 11/23/2020    History of Present Illness 70 yo male admitted s/p fall with PNA L sided effusion with chest tube placement 2/12  PMH HTN DM chronic back pain PE 06/2020 obese    PT Comments    Pt remains very deconditioned compared to baseline. Pt has 12 steps to access bed/bath and states he was bumping up backwards on bottom prior to admit. Pt with decreased ambulation tolerance due to onset of L flank pain and generalized deconditioning and is unable to complete stair negotiation. Recommend CIR upon d/c to progress mobility to be able to complete stair negotiation and achieve safe level of function for wife to care for him. Patient is trying to figure out if he could stay on first floor and then would have to only do 3 steps. Acute PT to cont to follow.    Follow Up Recommendations  CIR     Equipment Recommendations  None recommended by PT    Recommendations for Other Services       Precautions / Restrictions Precautions Precautions: Fall Precaution Comments: L chest tube Restrictions Weight Bearing Restrictions: No    Mobility  Bed Mobility Overal bed mobility: Needs Assistance Bed Mobility: Supine to Sit     Supine to sit: Min guard     General bed mobility comments: HOB elevated, pulled upon bed rails    Transfers Overall transfer level: Needs assistance Equipment used: Rolling walker (2 wheeled) Transfers: Sit to/from Stand Sit to Stand: Min assist         General transfer comment: minA to steady during transition of hands from bed/chair to rolling walker, increased time  Ambulation/Gait Ambulation/Gait assistance: Min assist;+2 safety/equipment Gait Distance (Feet): 40 Feet (x1, 30x1) Assistive device: Rolling walker (2 wheeled) Gait Pattern/deviations: Step-through pattern;Decreased stride length Gait velocity: dec Gait velocity  interpretation: <1.8 ft/sec, indicate of risk for recurrent falls General Gait Details: very dependent on bilat UEs due to bilat LE weakness, pt limited by onset of L flank pain requiring pt to sit due to insensity of the pain. Pt wtih report of feeling more winded than normal and states "I am wore out"   Stairs             Wheelchair Mobility    Modified Rankin (Stroke Patients Only)       Balance Overall balance assessment: Needs assistance Sitting-balance support: No upper extremity supported;Feet supported Sitting balance-Leahy Scale: Good     Standing balance support: Bilateral upper extremity supported;During functional activity Standing balance-Leahy Scale: Poor Standing balance comment: dependent on external support                            Cognition Arousal/Alertness: Awake/alert Behavior During Therapy: WFL for tasks assessed/performed Overall Cognitive Status: Within Functional Limits for tasks assessed                                 General Comments: pt continues with tangential speech, aware he is not in good enough shape to return home      Exercises      General Comments General comments (skin integrity, edema, etc.): L chest tube, 1Lo2 via [place]. Pt 95% at rest on RA, but dropped to 80s with movement, left pt on 1LO2 for amb      Pertinent  Vitals/Pain Pain Assessment: 0-10 Pain Score: 6  (10/10 during amb) Pain Location: L flank/chest tube sight Pain Descriptors / Indicators: Sharp;Stabbing Pain Intervention(s): Limited activity within patient's tolerance    Home Living                      Prior Function            PT Goals (current goals can now be found in the care plan section) Acute Rehab PT Goals Patient Stated Goal: home Progress towards PT goals: Progressing toward goals    Frequency    Min 3X/week      PT Plan Discharge plan needs to be updated    Co-evaluation               AM-PAC PT "6 Clicks" Mobility   Outcome Measure  Help needed turning from your back to your side while in a flat bed without using bedrails?: None Help needed moving from lying on your back to sitting on the side of a flat bed without using bedrails?: None Help needed moving to and from a bed to a chair (including a wheelchair)?: A Little Help needed standing up from a chair using your arms (e.g., wheelchair or bedside chair)?: A Little Help needed to walk in hospital room?: A Little Help needed climbing 3-5 steps with a railing? : A Lot 6 Click Score: 19    End of Session Equipment Utilized During Treatment: Oxygen;Gait belt Activity Tolerance: Patient tolerated treatment well Patient left: in chair;with call bell/phone within reach;with chair alarm set;with nursing/sitter in room (MD in room) Nurse Communication: Mobility status PT Visit Diagnosis: Unsteadiness on feet (R26.81);Muscle weakness (generalized) (M62.81);Difficulty in walking, not elsewhere classified (R26.2)     Time: 9892-1194 PT Time Calculation (min) (ACUTE ONLY): 32 min  Charges:  $Gait Training: 23-37 mins                     Kittie Plater, PT, DPT Acute Rehabilitation Services Pager #: [phone] Office #: [phone]    Berline Lopes 11/23/2020, 11:19 AM

## 2020-11-23 NOTE — Progress Notes (Signed)
[facility]                                                                              Patient Demographics  Keith [name], is a 70 y.o. male, DOB - Oct 08, 1951, IWP:[id number]  Admit date - 11/19/2020   Admitting Physician Eben Burow, MD  Outpatient Primary MD for the patient is [name], [name], MD  Outpatient specialists:   LOS - 4  days   Medical records reviewed and are as summarized below:    chief complaint : Pleuritic chest pain      Brief summary   Patient is a 70 year old male with history of hypertension, diabetes mellitus, chronic back pain, PE in 06/2020, has completed anticoagulation, ambulates with a walker presented with cough, chest pain, hemoptysis and fall. Per patient it started 5 days ago when he went out without a coat. When he returned, he started having cold-like symptoms, then fevers chills, purulent sputum from cough. On 11/18/2020, he fell and hit the car door with the left side of the chest and posterior, since then having excruciating pain. He also noticed hemoptysis that started prior to the injury.  On admission, CT chest showed loculated left-sided effusion. CCM was consulted and patient was admitted for further work-up. Patient is fully vaccinated for Covid   Assessment & Plan    Principal Problem:   CAP (community acquired pneumonia) with loculated left-sided effusion, acute hypoxic respiratory failure (baseline not home O2 dependent) -Presented with productive cough, fevers, hemoptysis, infiltrates in the left lower lobe with loculated effusion overlying consolidation -Urine strep pneumo antigen positive on admission, HIV negative, respiratory viral panel negative, blood culture no growth, sputum culture rare normal respiratory flora, body fluid culture no growth to date -CCM, IR consulted. Received V Zithromax, Rocephin -Underwent CT-guided left chest tube placement on 2/12 by IR  -  status post fibrinolysis x3 by  CCM -Repeat CT scan on February 15 there is a significant loculated component in the superior aspect of left major fissure that not amiable to drainage, CT surgery does not think he is a good surgical candidate -CT surgery and PCCM recommend long-term antibiotic, continue IV antibiotic in the hospital, discharged on Augmentin for a month treatment -Continue pain control, incentive spirometry -Still significant dyspnea on exertion , wean oxygen, O2 dropped to the 80s when ambulating ,may need home O2   Concern for possibility of underlying pulmonary nodules, outpatient CT chest done pulmonary follow-up in a month       AKI (acute kidney injury) ([place]) -Creatinine 1.48 on admission, baseline 1.1 -Patient was placed on gentle hydration, creatinine improved close to his baseline 1.2 -Encourage oral intake, DC hydration  Hypokalemia Potassium 3.3, remain low, increase potassium supplement to 40 mEq daily, check magnesium level    Hyperlipidemia associated with type 2 diabetes mellitus ([place]) -Continue Zocor     Type 2 diabetes mellitus with diabetic neuropathy, unspecified ([place]), with hypoglycemia - hold oral hypoglycemics -Continue sliding scale insulin, hemoglobin A1c 6.2  -CBG low 46 on febrile 14 a.m., blood glucose improved after change sensitive sliding scale    Chronic pain syndrome, chronic back pain,  now with recent fall, left-sided rib contusion -Continue pain control, IV morphine, oxycodone IR, Cymbalta -Continue Lidoderm patch  Constipation -States no BM since admission, continue senna, added Colace 100mg  BID, MiraLAX prn -Mag citrate x1 -Resolved   History of PE -No longer on Eliquis, no apparent PE on CTA  Aortic atherosclerosis, in addition to left main and 2 vessel coronary artery disease seen on noncontrast CT chest Currently denies chest pain Most recent LDL 82 Continue statin Blood glucose control He was on Eliquis in the past, no longer on any more, will  start baby aspirin Follow-up with PCP    Essential hypertension -BP currently stable, continue atenolol, chlorthalidone, Catapres  Depression, evaluated by psychiatry, does not meet inpatient criteria, recommend outpatient follow-up  Obesity class II Estimated body mass index is 36.62 kg/m as calculated from the following:   Height as of this encounter: 6' (1.829 m).   Weight as of this encounter: 122.5 kg.   A.m. labs ordered for tomorrow: Unresulted Labs (From admission, onward)          Start     Ordered   11/24/20 6644  Basic metabolic panel  Tomorrow morning,   R        11/23/20 1453   11/24/20 0500  Magnesium  Tomorrow morning,   R        11/23/20 1453          Code Status: DNR DVT Prophylaxis:  SCDs Start: 11/19/20 0833   Level of Care: Level of care: Med-Surg Family Communication: Discussed all imaging results, lab results, explained to the patient   Disposition Plan:     Status is: Inpatient  Remains inpatient appropriate because:Inpatient level of care appropriate due to severity of illness   Dispo: The patient is from: Home              Anticipated d/c is to: PT recommended CIR , however he is declined by CIR , will need repeat PT eval ,home with home health?              Anticipated d/c date is: Possibly tomorrow, need wean oxygen, need repeat PT                  Time Spent in minutes   35 minutes Procedures:  CT-guided chest tube placement on 11/19/2020 by IR followed by fibrinolysis of the complicated pleural effusion  Chest tube removal on February 15  Consultants:   Pulmonology IR Psychiatry  Antimicrobials:   Anti-infectives (From admission, onward)   Start     Dose/Rate Route Frequency Ordered Stop   11/19/20 1300  cefTRIAXone (ROCEPHIN) 2 g in sodium chloride 0.9 % 100 mL IVPB        2 g 200 mL/hr over 30 Minutes Intravenous Every 24 hours 11/19/20 0837 12/04/20 1259   11/19/20 1300  azithromycin (ZITHROMAX) 500 mg in sodium chloride  0.9 % 250 mL IVPB        500 mg 250 mL/hr over 60 Minutes Intravenous Every 24 hours 11/19/20 0837 12/04/20 1259   11/19/20 0615  vancomycin (VANCOREADY) IVPB 2000 mg/400 mL        2,000 mg 200 mL/hr over 120 Minutes Intravenous  Once 11/19/20 0600 11/19/20 1004   11/19/20 0615  piperacillin-tazobactam (ZOSYN) IVPB 3.375 g        3.375 g 100 mL/hr over 30 Minutes Intravenous  Once 11/19/20 0600 11/19/20 0727         Medications  Scheduled Meds: .  atenolol  50 mg Oral Daily   And  . chlorthalidone  25 mg Oral Daily  . cloNIDine  0.1 mg Oral Daily  . docusate sodium  100 mg Oral BID  . DULoxetine  60 mg Oral Daily  . insulin aspart  0-5 Units Subcutaneous QHS  . insulin aspart  0-9 Units Subcutaneous TID WC  . lidocaine  1 patch Transdermal Q24H  . multivitamin with minerals  1 tablet Oral Daily  . [START ON 11/24/2020] pneumococcal 13-valent conjugate vaccine  0.5 mL Intramuscular Tomorrow-1000  . potassium chloride SA  20 mEq Oral Daily  . simvastatin  40 mg Oral Daily   Continuous Infusions: . sodium chloride 75 mL/hr at 11/22/20 1732  . azithromycin 500 mg (11/23/20 1343)  . cefTRIAXone (ROCEPHIN)  IV 2 g (11/23/20 1239)   PRN Meds:.acetaminophen, alum & mag hydroxide-simeth, guaiFENesin-codeine, HYDROmorphone (DILAUDID) injection, morphine injection, ondansetron (ZOFRAN) IV, oxyCODONE, polyethylene glycol      Subjective:   Dhruva Orndoff was seen and examined today.  Pain is better controlled today, report still have some intermittent cough , mild pleuritic chest pain , reports significant dyspnea on exertion  Constipation has resolved   No hemoptysis, nausea or vomiting.    Objective:   Vitals:   11/23/20 0054 11/23/20 0526 11/23/20 0930 11/23/20 1340  BP: 126/71 112/70 128/77 120/65  Pulse: 74 81 73 75  Resp: 18 17 20 19   Temp: 98.6 F (37 C) 98.2 F (36.8 C)  98.4 F (36.9 C)  TempSrc:    Oral  SpO2: 97%  97% 95%  Weight:      Height:         Intake/Output Summary (Last 24 hours) at 11/23/2020 1441 Last data filed at 11/23/2020 0820 Gross per 24 hour  Intake --  Output 1450 ml  Net -1450 ml     Wt Readings from Last 3 Encounters:  11/19/20 122.5 kg  07/04/20 120.1 kg  07/03/20 123.3 kg    Physical Exam  General: Alert and oriented x 3, NAD  Cardiovascular: S1 S2 clear, RRR. No pedal edema b/l  Respiratory: Diminished breath sounds , no rales, no rhonchi, no wheezing  Gastrointestinal: Soft, nontender, nondistended, NBS  Ext: no pedal edema bilaterally  Neuro: no new deficits  Musculoskeletal: No cyanosis, clubbing  Skin: No rashes  Psych: Normal affect and demeanor, alert and oriented x3     Data Reviewed:  I have personally reviewed following labs and imaging studies  Micro Results Recent Results (from the past 240 hour(s))  Blood culture (routine x 2)     Status: None (Preliminary result)   Collection Time: 11/19/20  6:40 AM   Specimen: BLOOD LEFT HAND  Result Value Ref Range Status   Specimen Description BLOOD LEFT HAND  Final   Special Requests   Final    BOTTLES DRAWN AEROBIC AND ANAEROBIC Blood Culture adequate volume   Culture   Final    NO GROWTH 4 DAYS Performed at [facility], [address] [place], [place] [address]    Report Status PENDING  Incomplete  Resp Panel by RT-PCR (Flu A&B, Covid) Nasopharyngeal Swab     Status: None   Collection Time: 11/19/20  8:53 AM   Specimen: Nasopharyngeal Swab; Nasopharyngeal(NP) swabs in vial transport medium  Result Value Ref Range Status   SARS Coronavirus 2 by RT PCR NEGATIVE NEGATIVE Final    Comment: (NOTE) SARS-CoV-2 target nucleic acids are NOT DETECTED.  The SARS-CoV-2 RNA is  generally detectable in upper respiratory specimens during the acute phase of infection. The lowest concentration of SARS-CoV-2 viral copies this assay can detect is 138 copies/mL. A negative result does not preclude SARS-Cov-2 infection and should not  be used as the sole basis for treatment or other patient management decisions. A negative result may occur with  improper specimen collection/handling, submission of specimen other than nasopharyngeal swab, presence of viral mutation(s) within the areas targeted by this assay, and inadequate number of viral copies(<138 copies/mL). A negative result must be combined with clinical observations, patient history, and epidemiological information. The expected result is Negative.  Fact Sheet for Patients:  EntrepreneurPulse.com.au  Fact Sheet for Healthcare Providers:  IncredibleEmployment.be  This test is no t yet approved or cleared by the [place] FDA and  has been authorized for detection and/or diagnosis of SARS-CoV-2 by FDA under an Emergency Use Authorization (EUA). This EUA will remain  in effect (meaning this test can be used) for the duration of the COVID-19 declaration under Section 564(b)(1) of the Act, 21 U.S.C.section 360bbb-3(b)(1), unless the authorization is terminated  or revoked sooner.       Influenza A by PCR NEGATIVE NEGATIVE Final   Influenza B by PCR NEGATIVE NEGATIVE Final    Comment: (NOTE) The Xpert Xpress SARS-CoV-2/FLU/RSV plus assay is intended as an aid in the diagnosis of influenza from Nasopharyngeal swab specimens and should not be used as a sole basis for treatment. Nasal washings and aspirates are unacceptable for Xpert Xpress SARS-CoV-2/FLU/RSV testing.  Fact Sheet for Patients: EntrepreneurPulse.com.au  Fact Sheet for Healthcare Providers: IncredibleEmployment.be  This test is not yet approved or cleared by the [place] FDA and has been authorized for detection and/or diagnosis of SARS-CoV-2 by FDA under an Emergency Use Authorization (EUA). This EUA will remain in effect (meaning this test can be used) for the duration of the COVID-19 declaration under Section  564(b)(1) of the Act, 21 U.S.C. section 360bbb-3(b)(1), unless the authorization is terminated or revoked.  Performed at [facility], [place] [address] [place], [place] [address]   Culture, sputum-assessment     Status: None   Collection Time: 11/19/20 12:24 PM   Specimen: Sputum  Result Value Ref Range Status   Specimen Description EXPSU  Final   Special Requests Normal  Final   Sputum evaluation   Final    THIS SPECIMEN IS ACCEPTABLE FOR SPUTUM CULTURE Performed at [facility], [address], [place], [place] [address]    Report Status 11/19/2020 FINAL  Final  Culture, respiratory     Status: None   Collection Time: 11/19/20 12:24 PM  Result Value Ref Range Status   Specimen Description EXPSU  Final   Special Requests Normal Reflexed from [id number]  Final   Gram Stain   Final    ABUNDANT WBC PRESENT, PREDOMINANTLY PMN RARE SQUAMOUS EPITHELIAL CELLS PRESENT MODERATE GRAM NEGATIVE RODS FEW GRAM POSITIVE COCCI IN PAIRS    Culture   Final    RARE Normal respiratory flora-no Staph aureus or Pseudomonas seen Performed at [facility], [place] [address], [place], [place] [address]    Report Status 11/21/2020 FINAL  Final  Body fluid culture     Status: None (Preliminary result)   Collection Time: 11/19/20  1:33 PM   Specimen: Pleura  Result Value Ref Range Status   Specimen Description PLEURAL FLUID  Final   Special Requests LEFT  Final   Gram Stain   Final  ABUNDANT WBC PRESENT, PREDOMINANTLY PMN NO ORGANISMS SEEN    Culture   Final    NO GROWTH 2 DAYS Performed at [place] [address], [place], [place] [address]    Report Status PENDING  Incomplete    Radiology Reports CT CHEST WO CONTRAST  Result Date: 11/22/2020 CLINICAL DATA:  70 year old male with history of abnormal chest x-ray. Apparent parapneumonic pleural effusion. EXAM: CT CHEST WITHOUT CONTRAST TECHNIQUE: Multidetector CT imaging of the chest was performed following the  standard protocol without IV contrast. COMPARISON:  Chest CT 11/19/2020. FINDINGS: Cardiovascular: Heart size is normal. There is no significant pericardial fluid, thickening or pericardial calcification. There is aortic atherosclerosis, as well as atherosclerosis of the great vessels of the mediastinum and the coronary arteries, including calcified atherosclerotic plaque in the left main, left anterior descending and left circumflex coronary arteries. Mediastinum/Nodes: No pathologically enlarged mediastinal or hilar lymph nodes. Please note that accurate exclusion of hilar adenopathy is limited on noncontrast CT scans. Esophagus is unremarkable in appearance. No axillary lymphadenopathy. Lungs/Pleura: Compared to the prior examination, there has been interval placement of a small bore pigtail drainage catheter into the left pleural space posteriorly. The pleural fluid around the tip of the catheter has been evacuated when compared to the prior examination. Unfortunately, there continues to be a significant volume of loculated fluid in the left major fissure, particularly superiorly where the largest portion of this collection measures up to 7.3 x 4.4 x 6.5 cm (axial image 20 of series 3 and sagittal image 99 of series 8). Areas of developing scarring and/or atelectasis are noted in the periphery of the left lung. Trace volume of pneumothorax in the left pleural space, presumably iatrogenic related to indwelling chest tube. 5 mm left upper lobe pulmonary nodule (axial image 65 of series 6), stable compared to the recent prior examination, but increased in size compared to more remote prior study from 06/26/2020 (previously only 2 mm). 3 mm right middle lobe pulmonary nodule (axial image 88 of series 6), stable compared to prior study from 06/26/2020, nonspecific, but statistically likely benign. Right lung is otherwise clear. Upper Abdomen: Aortic atherosclerosis. Musculoskeletal: There are no aggressive appearing  lytic or blastic lesions noted in the visualized portions of the skeleton. IMPRESSION: 1. Interval placement of pigtail drainage catheter in the left pleural space with residual loculated hydropneumothorax. Although the pleural catheter has successfully drained much of the fluid around the periphery of the left lung, there is a significant loculated component in the superior aspect of the left major fissure, as detailed above. 2. Small pulmonary nodules in the lungs bilaterally, most notable for a 5 mm left upper lobe pulmonary nodule. This is nonspecific and is stable compared to the most recent prior examination, but has increased in size compared to 06/26/2020. Attention on future follow-up imaging is recommended to ensure stability. 3. Aortic atherosclerosis, in addition to left main and 2 vessel coronary artery disease. Please note that although the presence of coronary artery calcium documents the presence of coronary artery disease, the severity of this disease and any potential stenosis cannot be assessed on this non-gated CT examination. Assessment for potential risk factor modification, dietary therapy or pharmacologic therapy may be warranted, if clinically indicated. Aortic Atherosclerosis (ICD10-I70.0). Electronically Signed   By: Vinnie Langton M.D.   On: 11/22/2020 10:46   CT Angio Chest PE W and/or Wo Contrast  Result Date: 11/19/2020 CLINICAL DATA:  70 year old male status post fall yesterday striking left ribs  again scar. Pain. Shallow inspiration. EXAM: CT ANGIOGRAPHY CHEST WITH CONTRAST TECHNIQUE: Multidetector CT imaging of the chest was performed using the standard protocol during bolus administration of intravenous contrast. Multiplanar CT image reconstructions and MIPs were obtained to evaluate the vascular anatomy. CONTRAST:  139mL OMNIPAQUE IOHEXOL 350 MG/ML SOLN COMPARISON:  Portable chest 0436 hours today. CTA chest 06/26/2020. 01/19/2012 CT Abdomen and Pelvis FINDINGS:  Cardiovascular: Adequate contrast bolus timing in the pulmonary arterial tree. Respiratory motion, respiratory motion, pronounced at the lung bases and also the right hilum. No central pulmonary thrombus, but in segmental branches of the right upper lobe (series 6, image 101) and both lower lobes (series 6, image 154 and 161) there is suggestion of small volume nonocclusive clot. However, saddle and hilar embolus was demonstrated in September. Therefore, this is likely the residual of previous pulmonary embolus. Stable cardiac size at the upper limits of normal. No pericardial effusion. Negative visible aorta aside from tortuosity and minimal plaque. Mediastinum/Nodes: Negative.  No mediastinal hematoma. Lungs/Pleura: Mostly loculated, minimally layering left pleural effusion with mildly hyperdense fluid loculated in the left lateral chest, but low-density appearing fluid elsewhere. Some fluid trapped in the major fissure. Superimposed confluent left lower lobe heterogeneous consolidation, where the left lower lobe was clear in September. Left major airways remain patent. No left pneumothorax. Right lung is negative aside from mild atelectasis. Upper Abdomen: Negative visible liver, gallbladder, spleen, pancreas, and bowel. Stable left adrenal thickening/nodularity greater on the left. Density there is fairly low (12 Hounsfield units, and some macroscopic fat seems to be visible (series 5, image 85) and furthermore this has not significantly changed since CT Abdomen and Pelvis 01/19/2012, consistent with benign adenoma. Stable visible kidneys, benign-appearing left upper pole cyst. Musculoskeletal: No left-side rib fracture is identified. There is severe degeneration in the spine with S-shaped thoracolumbar scoliosis and chronic thoracolumbar junction laminectomy changes. Stable visualized osseous structures. Review of the MIP images confirms the above findings. IMPRESSION: 1. Negative for acute pulmonary embolus  but positive for small volume nonocclusive chronic PE, the sequelae of saddle embolus demonstrated in September. 2. Mixed loculated > layering left Pleural Effusion. Much of the fluid has low-density, but Hemothorax is difficult to exclude in the setting of trauma. However, no overlying rib fracture is identified. 3. Superimposed left lower lobe consolidation more suggestive of Pneumonia than Pulmonary Contusion. Pulmonary infarct persisting since September is unlikely. 4. Stable benign adrenal thickening, left adrenal adenoma. Electronically Signed   By: Genevie Ann M.D.   On: 11/19/2020 05:49   DG Chest Port 1 View  Result Date: 11/21/2020 CLINICAL DATA:  Chest tube. EXAM: PORTABLE CHEST 1 VIEW COMPARISON:  11/20/2020 FINDINGS: Pigtail type catheter overlies the LEFT lung base, stable in appearance. There is persistent opacity throughout the LEFT LOWER lobe. Focal lucency along the LATERAL aspect of the LEFT hemithorax appears stable, consistent with small loculated pneumothorax. Loculated pleural effusion projects to the LEFT of the aortic knob, within the major fissure, seen on CT exam. Shallow lung inflation.  No pulmonary edema. IMPRESSION: 1. Persistent significant opacity throughout the LEFT LOWER lobe. 2. Persistent loculated pleural effusion. 3. Persistent small loculated pneumothorax. Electronically Signed   By: Nolon Nations M.D.   On: 11/21/2020 08:07   DG CHEST PORT 1 VIEW  Result Date: 11/20/2020 CLINICAL DATA:  70 year old male with recent fall, left pleural effusion and airspace disease. Status post CT-guided left chest tube placement yesterday. EXAM: PORTABLE CHEST 1 VIEW COMPARISON:  CTA chest 11/19/2020 and earlier.  FINDINGS: Portable AP semi upright view at 0419 hours. Pigtail left chest tube now in place. Skin fold artifacts suspected in the left chest. No significant pneumothorax although there may be a small focus of loculated pleural gas laterally (arrow). Continued low lung volumes.  Patchy and confluent left lung base opacity persists. Stable cardiac size and mediastinal contours. No overt edema. Scoliosis. Stable visualized osseous structures. Negative visible bowel gas pattern. IMPRESSION: 1. Left chest tube in place with no significant pneumothorax; trace loculated pleural gas laterally. 2. Continued low lung volumes and left lung base opacity. Electronically Signed   By: Genevie Ann M.D.   On: 11/20/2020 07:00   DG Chest Port 1 View  Result Date: 11/19/2020 CLINICAL DATA:  Chest pain post fall. Fall yesterday from car. Left-sided pain radiating to the back. EXAM: PORTABLE CHEST 1 VIEW COMPARISON:  Radiograph and CT 06/26/2020 FINDINGS: Low lung volumes. Upper normal heart size, unchanged from prior exam. Aortic tortuosity is unchanged. Left pleural effusion which partially tracks laterally. Patchy retrocardiac opacity. No visualized pneumothorax. Right lung is clear. Scoliotic curvature of the spine. No visualized left rib fractures. IMPRESSION: Left pleural effusion with patchy retrocardiac opacity, may be contusion in the setting of injury. No visualized pneumothorax or left rib fractures. Electronically Signed   By: Keith Rake M.D.   On: 11/19/2020 03:53   CT IMAGE GUIDED DRAINAGE BY PERCUTANEOUS CATHETER  Result Date: 11/19/2020 INDICATION: Left loculated effusion EXAM: CT-GUIDED POSTERIOR LEFT 10 FRENCH CHEST TUBE PLACEMENT MEDICATIONS: The patient is currently admitted to the hospital and receiving intravenous antibiotics. The antibiotics were administered within an appropriate time frame prior to the initiation of the procedure. ANESTHESIA/SEDATION: Fentanyl 50 mcg IV; Versed 1.0 mg IV Moderate Sedation Time:  11 MINUTES The patient was continuously monitored during the procedure by the interventional radiology nurse under my direct supervision. COMPLICATIONS: None immediate. PROCEDURE: Informed written consent was obtained from the patient after a thorough discussion of  the procedural risks, benefits and alternatives. All questions were addressed. Maximal Sterile Barrier Technique was utilized including caps, mask, sterile gowns, sterile gloves, sterile drape, hand hygiene and skin antiseptic. A timeout was performed prior to the initiation of the procedure. previous imaging reviewed. patient position right side down decubitus. noncontrast localization ct performed. the left complex loculated effusion was localized and marked for a posterolateral approach. under sterile conditions and local anesthesia, an 18 gauge introducer needle was advanced from a lateral intercostal approach into the effusion. needle position confirmed with ct. syringe aspiration yielded serous pleural fluid. sample sent for culture. guidewire inserted followed by tract dilatation to insert a 10 french drain. drain catheter position confirmed with ct. catheter secured with prolene suture and connected to external pleura vac. sterile dressing applied. no immediate complication. patient tolerated the procedure well. IMPRESSION: Successful CT-guided 10 French left chest tube insertion Electronically Signed   By: Jerilynn Mages.  Shick M.D.   On: 11/19/2020 13:52    Lab Data:  CBC: Recent Labs  Lab 11/19/20 0325 11/19/20 0419 11/20/20 0122 11/21/20 0200 11/22/20 0131 11/23/20 0155  WBC 10.4  --  17.9* 14.9* 11.7* 9.5  NEUTROABS 8.4*  --  14.7*  --   --   --   HGB 12.4* 13.6 12.4* 11.6* 11.1* 10.3*  HCT 39.0 40.0 38.3* 36.7* 33.4* 31.3*  MCV 90.3  --  88.0 91.8 88.1 88.2  PLT 446*  --  460* 370 393 474*   Basic Metabolic Panel: Recent Labs  Lab 11/19/20 0325 11/19/20 0419  11/20/20 0122 11/21/20 0718 11/22/20 0131 11/23/20 0155  NA 136 140 138 140 137 137  K 3.3* 3.3* 3.1* 3.3* 3.4* 3.3*  CL 99 101 101 99 101 101  CO2 26  --  26 22 25 26   GLUCOSE 158* 156* 177* 46* 141* 111*  BUN 26* 31* 16 17 16 11   CREATININE 1.48* 1.40* 1.21 1.22 1.11 1.06  CALCIUM 9.9  --  9.5 9.7 9.1 9.5  MG  --   --    --  2.1  --   --   PHOS  --   --   --  2.5  --   --    GFR: Estimated Creatinine Clearance: 90.2 mL/min (by C-G formula based on SCr of 1.06 mg/dL). Liver Function Tests: No results for input(s): AST, ALT, ALKPHOS, BILITOT, PROT, ALBUMIN in the last 168 hours. No results for input(s): LIPASE, AMYLASE in the last 168 hours. No results for input(s): AMMONIA in the last 168 hours. Coagulation Profile: No results for input(s): INR, PROTIME in the last 168 hours. Cardiac Enzymes: No results for input(s): CKTOTAL, CKMB, CKMBINDEX, TROPONINI in the last 168 hours. BNP (last 3 results) No results for input(s): PROBNP in the last 8760 hours. HbA1C: Recent Labs    11/21/20 0200  HGBA1C 6.2*   CBG: Recent Labs  Lab 11/22/20 1124 11/22/20 1705 11/22/20 2129 11/23/20 0750 11/23/20 1209  GLUCAP 102* 121* 137* 71 126*   Lipid Profile: No results for input(s): CHOL, HDL, LDLCALC, TRIG, CHOLHDL, LDLDIRECT in the last 72 hours. Thyroid Function Tests: No results for input(s): TSH, T4TOTAL, FREET4, T3FREE, THYROIDAB in the last 72 hours. Anemia Panel: No results for input(s): VITAMINB12, FOLATE, FERRITIN, TIBC, IRON, RETICCTPCT in the last 72 hours. Urine analysis:    Component Value Date/Time   COLORURINE YELLOW 01/19/2019 1422   APPEARANCEUR HAZY (A) 01/19/2019 1422   LABSPEC 1.020 01/19/2019 1422   PHURINE 5.0 01/19/2019 1422   GLUCOSEU NEGATIVE 01/19/2019 1422   HGBUR MODERATE (A) 01/19/2019 1422   HGBUR negative 10/26/2010 0824   BILIRUBINUR NEGATIVE 01/19/2019 1422   BILIRUBINUR n 01/12/2019 1020   KETONESUR NEGATIVE 01/19/2019 1422   PROTEINUR 100 (A) 01/19/2019 1422   UROBILINOGEN 0.2 01/12/2019 1020   UROBILINOGEN 0.2 04/10/2013 1555   NITRITE NEGATIVE 01/19/2019 1422   LEUKOCYTESUR NEGATIVE 01/19/2019 1422     Florencia Reasons M.D. PhD FACP [facility] 11/23/2020, 2:41 PM   Call night coverage person covering after 7pm

## 2020-11-23 NOTE — Progress Notes (Signed)
Chest Tube removed

## 2020-11-23 NOTE — Telephone Encounter (Signed)
Call made to patient, confirmed DOB. Made aware Dr Carlis Abbott asked that we contact him to set up his consult appt as well as CT scan. Made aware the CT order has been placed and they will contact him closer to time appt is needed. Voiced understanding. Appt made for 12/28/20 with MD Hunsucker. Made aware if he has not had his CT scan at time of appt to call and reschedule. Voiced understanding. Requesting letter be sent with appt information. Printed and mailed.   Nothing further needed at this time.

## 2020-11-23 NOTE — Progress Notes (Signed)
Inpatient Rehab Admissions Coordinator Note:   Per PT recommendations, pt was screened for CIR candidacy by Shann Medal, PT, DPT.  Discussed case with PT.  Based on recent payor trends, [facility] Medicare unlikely to approve CIR stay for diagnosis of PNA.  Would not recommend a consult at this time.  Please contact me with questions.   Shann Medal, PT, DPT [phone] 11/23/20 1:32 PM

## 2020-11-23 NOTE — Progress Notes (Addendum)
NAME:  Keith [name], MRN:  [id number], DOB:  04-Mar-1951, LOS: 4 ADMISSION DATE:  11/19/2020, CONSULTATION DATE:  2/12 REFERRING MD:  [facility], CHIEF COMPLAINT:  Dyspnea    Brief History:  64 to male who presented with cough, hemoptysis, shortness of breath, pleuritic left chest pain found to have loculated pleural effusions.  First dose of TPA and dornase 2/12.   History of Present Illness:  70 year old male with a well-documented past medical history who was in his usual state of medium health he is walker bound due to injuries to his left leg and back pain he has been on anticoagulants in the past but not currently taking any 5 days ago he started having fevers chills purulent sputum from cough.  He noted he was so uncomfortable he felt lightheaded.  2 days ago he fell hit the car door with the left side of his chest and in the posterior is noted excruciating pain since then.  He notes hemoptysis that started prior to the injury.  He was seen in the emergency room 11/19/2020 CT scan revealed a loculated left effusion and pulmonary critical care was called for chest tube insertion.  IR placed chest tube due to the positioning of the effusion posterior to the scapula.  Currently Keith [name] is in no distress he is awake alert saturating well.  He has been fully vaccinated against Covid and is Covid testing is negative at this time.  Pulmonary critical care will continue to follow along should he need lytic therapy for his effusions as it is loculated  Past Medical History:  PE on anticoagulation Hypertension Esophageal stricture Type 2 diabetes Spinal stenosis  Significant Hospital Events:  Admitted 2/12  Consults:  Interventional radiology  Procedures:  CT-guided smallbore biopsy chest tube placed by interventional radiology 2/12  Significant Diagnostic Tests:  CTA chest > negative for acute PE but positive for nonocclusive chronic PE.  Mixed loculated versus layering left pleural  effusion.  Superimposed left lower lobe consolidation more suggestive of pneumonia versus contusion  Micro Data:  COVID 2/12 > negative Sputum culture 2/12 > normal flora Blood culture 2/12 > Pleural fluid culture 2/12 >  Antimicrobials:  Azithromycin 2/12 > Ceftriaxone 2/12 > Zosyn x1 Vancomycin x1   Interim History / Subjective:  Still having chest tube-associated left sided chest pain. No change in breathing.  Objective   Blood pressure 128/77, pulse 73, temperature 98.2 F (36.8 C), resp. rate 20, height 6' (1.829 m), weight 122.5 kg, SpO2 97 %.        Intake/Output Summary (Last 24 hours) at 11/23/2020 0944 Last data filed at 11/23/2020 0820 Gross per 24 hour  Intake 240 ml  Output 1750 ml  Net -1510 ml   Filed Weights   11/19/20 0313  Weight: 122.5 kg    Examination: General: elderly man sitting up in bed in NAD HEENT: [place]/AT, eyes anicteric Neuro: awake and alert, answering questions appropriately CV: S1S2, RRR PULM: breathing comfortably on [place], CTAB. Chest tube atrium kicked over, no significant increase in drainage in the past day. GI: soft, NT Extremities: no peripheral edema, no cyanosis Skin: no rashes or wounds  Resolved Hospital Problem list     Assessment & Plan:  Loculated left effusion-- enlarging apical medial portion extending into fissure despite pleural lytics. Not in a location amenable to additional tube drainage and not a good surgical candidate.  -Status post small bore chest tube placed by interventional radiology 2/12 -Status post treatment with TPA and  dornase Community-acquired pneumonia- pneumococcus Status post mechanical fall  Hemoptysis due to pneumonia P: -Recommend prolonged course of antibiotics to address effusion-- will plan for discharge on augmentin until follow up in the office with repeat chest CT scan. Appointment has been requested. Would keep IV ceftriaxone until discharge. -remove chest tube -prevnar 13 vaccination  at discharge ordered;, needs pneumococcal 23 in 6 months.  Possible pulmonary nodules- suspect these are infectious -outpatient follow up imaging 1 months after discharge  PCCM will sign off; please call with questions.  Best practice (evaluated daily)  Per [facility]   Labs   CBC: Recent Labs  Lab 11/19/20 0325 11/19/20 0419 11/20/20 0122 11/21/20 0200 11/22/20 0131 11/23/20 0155  WBC 10.4  --  17.9* 14.9* 11.7* 9.5  NEUTROABS 8.4*  --  14.7*  --   --   --   HGB 12.4* 13.6 12.4* 11.6* 11.1* 10.3*  HCT 39.0 40.0 38.3* 36.7* 33.4* 31.3*  MCV 90.3  --  88.0 91.8 88.1 88.2  PLT 446*  --  460* 370 393 414*    Basic Metabolic Panel: Recent Labs  Lab 11/19/20 0325 11/19/20 0419 11/20/20 0122 11/21/20 0718 11/22/20 0131 11/23/20 0155  NA 136 140 138 140 137 137  K 3.3* 3.3* 3.1* 3.3* 3.4* 3.3*  CL 99 101 101 99 101 101  CO2 26  --  26 22 25 26   GLUCOSE 158* 156* 177* 46* 141* 111*  BUN 26* 31* 16 17 16 11   CREATININE 1.48* 1.40* 1.21 1.22 1.11 1.06  CALCIUM 9.9  --  9.5 9.7 9.1 9.5  MG  --   --   --  2.1  --   --   PHOS  --   --   --  2.5  --   --    GFR: Estimated Creatinine Clearance: 90.2 mL/min (by C-G formula based on SCr of 1.06 mg/dL). Recent Labs  Lab 11/19/20 1059 11/20/20 0122 11/21/20 0200 11/22/20 0131 11/23/20 0155  PROCALCITON 0.14  --   --   --   --   WBC  --  17.9* 14.9* 11.7* 9.5    Liver Function Tests: No results for input(s): AST, ALT, ALKPHOS, BILITOT, PROT, ALBUMIN in the last 168 hours. No results for input(s): LIPASE, AMYLASE in the last 168 hours. No results for input(s): AMMONIA in the last 168 hours.  ABG    Component Value Date/Time   HCO3 23.8 06/27/2020 0419   TCO2 27 11/19/2020 0419   ACIDBASEDEF 2.0 06/27/2020 0419   O2SAT 69.0 06/27/2020 0419     Coagulation Profile: No results for input(s): INR, PROTIME in the last 168 hours.  Cardiac Enzymes: No results for input(s): CKTOTAL, CKMB, CKMBINDEX, TROPONINI in the last  168 hours.  HbA1C: Hgb A1c MFr Bld  Date/Time Value Ref Range Status  11/21/2020 02:00 AM 6.2 (H) 4.8 - 5.6 % Final    Comment:    (NOTE) Pre diabetes:          5.7%-6.4%  Diabetes:              >6.4%  Glycemic control for   <7.0% adults with diabetes   06/27/2020 08:00 PM 6.7 (H) 4.8 - 5.6 % Final    Comment:    (NOTE) Pre diabetes:          5.7%-6.4%  Diabetes:              >6.4%  Glycemic control for   <7.0% adults with diabetes  CBG: Recent Labs  Lab 11/22/20 0724 11/22/20 1124 11/22/20 1705 11/22/20 2129 11/23/20 0750  GLUCAP 125* 102* 121* 137* 71     Julian Hy, DO 11/23/20 10:20 AM [facility]  From 7AM- 7PM if no response to pager, please call [phone]. After hours, 7PM- 7AM, please call Elink  [phone].

## 2020-11-23 NOTE — Telephone Encounter (Signed)
Keith [name] needs a post-hospital follow up appointment in 1 month to establish with a new provider. Needs non-cortast CT scan before follow up.  Julian Hy, DO 11/23/20 10:18 AM [facility]

## 2020-11-24 DIAGNOSIS — E119 Type 2 diabetes mellitus without complications: Secondary | ICD-10-CM

## 2020-11-24 LAB — BODY FLUID CULTURE W GRAM STAIN: Culture: NO GROWTH

## 2020-11-24 LAB — BASIC METABOLIC PANEL
Anion gap: 11 (ref 5–15)
BUN: 11 mg/dL (ref 8–23)
CO2: 24 mmol/L (ref 22–32)
Calcium: 9.3 mg/dL (ref 8.9–10.3)
Chloride: 98 mmol/L (ref 98–111)
Creatinine, Ser: 1.02 mg/dL (ref 0.61–1.24)
GFR, Estimated: >60 mL/min (ref >60)
Glucose, Bld: 130 mg/dL — ABNORMAL HIGH (ref 70–99)
Potassium: 3.8 mmol/L (ref 3.5–5.1)
Sodium: 133 mmol/L — ABNORMAL LOW (ref 135–145)

## 2020-11-24 LAB — CULTURE, BLOOD (ROUTINE X 2)
Culture: NO GROWTH
Special Requests: ADEQUATE

## 2020-11-24 LAB — MAGNESIUM: Magnesium: 1.8 mg/dL (ref 1.7–2.4)

## 2020-11-24 LAB — GLUCOSE, CAPILLARY
Glucose-Capillary: 125 mg/dL — ABNORMAL HIGH (ref 70–99)
Glucose-Capillary: 85 mg/dL (ref 70–99)

## 2020-11-24 MED ORDER — SACCHAROMYCES BOULARDII 250 MG PO CAPS
250.0000 mg | ORAL_CAPSULE | Freq: Two times a day (BID) | ORAL | Status: DC
  Administered 2020-11-24: 250 mg via ORAL
  Filled 2020-11-24: qty 1

## 2020-11-24 MED ORDER — SACCHAROMYCES BOULARDII 250 MG PO CAPS: 250.0000 mg | ORAL_CAPSULE | Freq: Two times a day (BID) | ORAL | 0 refills | Status: AC

## 2020-11-24 MED ORDER — AMOXICILLIN-POT CLAVULANATE 875-125 MG PO TABS: 1.0000 | ORAL_TABLET | Freq: Two times a day (BID) | ORAL | 0 refills | Status: AC

## 2020-11-24 MED ORDER — AMOXICILLIN-POT CLAVULANATE 875-125 MG PO TABS
1.0000 | ORAL_TABLET | Freq: Two times a day (BID) | ORAL | Status: DC
  Administered 2020-11-24: 1 via ORAL
  Filled 2020-11-24: qty 1

## 2020-11-24 NOTE — TOC Progression Note (Signed)
Transition of Care [facility]) - Progression Note    Patient Details  Name: Keith [name] MRN: [id number] Date of Birth: 09-30-1951  Transition of [facility]) CM/SW Contact  Jacalyn Lefevre Edson Snowball, RN Phone Number: 11/24/2020, 10:26 AM  Clinical Narrative:     Amy with [facility] accepted referral for home health PT.  Expected Discharge Plan: Skilled Nursing Facility Barriers to Discharge: Insurance Authorization,Continued Medical Work up  Expected Discharge Plan and Services Expected Discharge Plan: [place] arrangements for the past 2 months: Single Family Home Expected Discharge Date: 11/24/20                                     Social Determinants of Health (SDOH) Interventions    Readmission Risk Interventions No flowsheet data found.

## 2020-11-24 NOTE — Progress Notes (Signed)
SATURATION QUALIFICATIONS: (This note is used to comply with regulatory documentation for home oxygen)  Patient Saturations on Room Air at Rest = 98%  Patient Saturations on Room Air while Ambulating = 98%  Patient Saturations on 0 Liters of oxygen while Ambulating = 98%  Please briefly explain why patient needs home oxygen: Patients O2 sat did not decrease during ambulation. Does not require oxygen when ambulating.

## 2020-11-24 NOTE — Discharge Summary (Signed)
Discharge Summary  Keith [name] CVE:[id number] DOB: [birth year]  PCP: [name], [name], MD  Admit date: 11/19/2020 Discharge date: 11/24/2020  Time spent: 57mins, more than 50% time spent on coordination of care.   Recommendations for Outpatient Follow-up:  1. F/u with PCP within a week  for hospital discharge follow up, repeat cbc/bmp at follow up 2. F/u with pulmonology as scheduled  3. Outpatient psychiatry ([facility] ) follow up for depression management/therapy  Discharge Diagnoses:  Active Hospital Problems   Diagnosis Date Noted  . CAP (community acquired pneumonia) 11/19/2020  . Loculated pleural effusion 11/19/2020  . DNR (do not resuscitate) 11/19/2020  . Chronic pain syndrome   . Hyperlipidemia associated with type 2 diabetes mellitus ([place]) 11/14/2016  . Type 2 diabetes mellitus with diabetic neuropathy, unspecified ([place]) 11/14/2016  . AKI (acute kidney injury) ([place]) 04/10/2013  . Class 2 obesity due to excess calories with body mass index (BMI) of 36.0 to 36.9 in adult 11/07/2007  . Essential hypertension 11/07/2007    Resolved Hospital Problems  No resolved problems to display.    Discharge Condition: stable  Diet recommendation: heart healthy/carb modified  Filed Weights   11/19/20 0313  Weight: 122.5 kg    History of present illness:  Chief Complaint: Pleuritic chest pain  HPI: Keith [name] is a 70 y.o. male with medical history significant of HTN; DM; chronic back pain; and PE in 06/2020 presenting with cough, chest pain, hemoptysis, and fall.  It started 3 days agfo - he went out without a coat and got sweaty and came back in with the start of a cold.  About 6 hours later, he started coughing up blood.  He felt a bit better.  Yesterday, he ran errands and stepped in mud and fell back and hit his back on the car door.  Last night, about 11pm he thought he was having a heart attack - couldn't move, breathe, or lift his arm.  EMS  came and his vitals were pretty normal.  However, he was having sharp pleuritic chest pain and they brought him to the ER.  He is no longer coughing up blood but it did recur last night.  He is coughing up regular phlegm.  +fever to 101.4 on Tuesday, otherwise none.  No sick contacts.  He was hospitalized from 9/19-27 for acute saddle PE with cor pulmonale following prior admission for back pain.  While hospitalized he had a mechanical fall and required ORIF for ankle fracture.  He was discharged to [facility].  He was treated with Eliquis but reports that this was discontinued after 90 days.   CTA chest in the ED on 2/12  showed "1. 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  2. Mixed loculated > layering left Pleural Effusion. Much of the fluid has low-density, but Hemothorax is difficult to exclude in the setting of trauma. However, no overlying rib fracture is identified.  3. Superimposed left lower lobe consolidation more suggestive of Pneumonia than Pulmonary Contusion. Pulmonary infarct persisting since September is unlikely.  4. Stable benign adrenal thickening, left adrenal adenoma"   Hospital Course:  Principal Problem:   CAP (community acquired pneumonia) Active Problems:   Class 2 obesity due to excess calories with body mass index (BMI) of 36.0 to 36.9 in adult   Essential hypertension   AKI (acute kidney injury) ([place])   Hyperlipidemia associated with type 2  diabetes mellitus ([place])   Type 2 diabetes mellitus with diabetic neuropathy, unspecified ([facility])   Chronic pain syndrome   Loculated pleural effusion   DNR (do not resuscitate)   CAP (community acquired pneumonia) with loculated left-sided effusion, acute hypoxic respiratory failure (baseline not home O2 dependent) -Presented with productive cough, fevers, hemoptysis, infiltrates in the left lower lobe with  loculated effusion overlying consolidation -Urine strep pneumo antigen positive on admission, HIV negative, respiratory viral panel negative, blood culture no growth, sputum culture rare normal respiratory flora, body fluid culture no growth  -Underwent CT-guided left chest tube placement on 2/12 by IR  -  status post fibrinolysis x3 by PCCM -Repeat CT scan on February 15 there is a significant loculated component in the superior aspect of left major fissure that not amiable to drainage, CT surgery does not think he is a good surgical candidate -CT surgery and PCCM recommended long-term antibiotic, continue IV antibiotic in the hospital iv Zithromax, Rocephin, discharged on Augmentin for a month treatment -Continue pain control, incentive spirometry -able to wean off o2, Patient Saturations on Room Air while Ambulating = 98%    Concern for possibility of underlying pulmonary nodules, outpatient CT chest done pulmonary follow-up in a month       AKI (acute kidney injury) ([place]) -Creatinine 1.48 on admission, baseline 1.1 -Patient received  gentle hydration, creatinine improved and back to  his baseline 1.02 -Encourage oral intake  Hypokalemia Needs replacement, discharge on k supplement, f/u with pcp     Hyperlipidemia associated with type 2 diabetes mellitus ([place]) -Continue Zocor     Type 2 diabetes mellitus with diabetic neuropathy, unspecified ([place]) - home meds metformin held in the hospital, resumed at discharge -hemoglobin A1c 6.2   Hypoglycemia: -CBG low 46 on 2/ 14 a.m., blood glucose improved after change sensitive sliding scale    Chronic pain syndrome, chronic back pain, now with recent fall, left-sided rib contusion -Continue pain control, IV morphine, oxycodone IR, Cymbalta -Continue Lidoderm patch  Constipation -States no BM since admission, continue senna, added Colace 100mg  BID, MiraLAX prn -Mag citrate x1 -Resolved   History of PE -No longer on  Eliquis -CTA on 2/12 showed " Negative for acute pulmonary embolus but positive for small volume nonocclusive chronic PE, the sequelae of saddle embolus demonstrated in September."   Aortic atherosclerosis, in addition to left main and 2 vessel coronary artery disease seen on noncontrast CT chest Currently denies chest pain Most recent LDL 82 Continue statin Blood glucose control He was on Eliquis in the past, no longer on any more, not able to start  Aspirin due to h/o gi intolerance and peptic ulcer Follow-up with PCP   Essential hypertension -BP currently stable, continue atenolol, chlorthalidone, Catapres  Depression, evaluated by psychiatry, does not meet inpatient criteria, recommend outpatient follow-up  Obesity class II Estimated body mass index is 36.62 kg/m as calculated from the following:   Height as of this encounter: 6' (1.829 m).   Weight as of this encounter: 122.5 kg.     Code Status: DNR DVT Prophylaxis:  SCDs Start: 11/19/20 2706   Family Communication: Discussed all imaging results, lab results, explained to the patient   Disposition Plan:   patient declined SNF, he desires to go home, states has enough help at home, home health arranged  Procedures:  CT-guided chest tube placement on 11/19/2020 by IR followed by fibrinolysis of the complicated pleural effusion  Chest tube removal on February 15  Consultants:  Pulmonology IR Psychiatry  Antimicrobials:  Anti-infectives (From admission, onward)   Start     Dose/Rate Route Frequency Ordered Stop   11/24/20 1030  amoxicillin-clavulanate (AUGMENTIN) 875-125 MG per tablet 1 tablet        1 tablet Oral Every 12 hours 11/24/20 0943     11/24/20 0000  amoxicillin-clavulanate (AUGMENTIN) 875-125 MG tablet        1 tablet Oral Every 12 hours 11/24/20 1002 12/24/20 2359   11/19/20 1300  cefTRIAXone (ROCEPHIN) 2 g in sodium chloride 0.9 % 100 mL IVPB  Status:  Discontinued        2 g 200  mL/hr over 30 Minutes Intravenous Every 24 hours 11/19/20 0837 11/24/20 0942   11/19/20 1300  azithromycin (ZITHROMAX) 500 mg in sodium chloride 0.9 % 250 mL IVPB  Status:  Discontinued        500 mg 250 mL/hr over 60 Minutes Intravenous Every 24 hours 11/19/20 0837 11/24/20 0942   11/19/20 0615  vancomycin (VANCOREADY) IVPB 2000 mg/400 mL        2,000 mg 200 mL/hr over 120 Minutes Intravenous  Once 11/19/20 0600 11/19/20 1004   11/19/20 0615  piperacillin-tazobactam (ZOSYN) IVPB 3.375 g        3.375 g 100 mL/hr over 30 Minutes Intravenous  Once 11/19/20 0600 11/19/20 0727       Discharge Exam: BP 120/67 (BP Location: Right Arm)   Pulse 79   Temp 97.9 F (36.6 C)   Resp 17   Ht 6' (1.829 m)   Wt 122.5 kg   SpO2 95%   BMI 36.62 kg/m   General: NAD Cardiovascular: RRR Respiratory: normal respiratory effort  Discharge Instructions You were cared for by a hospitalist during your hospital stay. If you have any questions about your discharge medications or the care you received while you were in the hospital after you are discharged, you can call the unit and asked to speak with the hospitalist on call if the hospitalist that took care of you is not available. Once you are discharged, your primary care physician will handle any further medical issues. Please note that NO REFILLS for any discharge medications will be authorized once you are discharged, as it is imperative that you return to your primary care physician (or establish a relationship with a primary care physician if you do not have one) for your aftercare needs so that they can reassess your need for medications and monitor your lab values.  Discharge Instructions    Diet - low sodium heart healthy   Complete by: As directed    Carb modified diet   Increase activity slowly   Complete by: As directed      Allergies as of 11/24/2020      Reactions   Aspirin Other (See Comments)   Irritates the stomach and the patient  developed ulcers, also   Lisinopril Other (See Comments)   Caused a body ache      Medication List    STOP taking these medications   apixaban 5 MG Tabs tablet Commonly known as: ELIQUIS   docusate sodium 100 MG capsule Commonly known as: COLACE   oxyCODONE 5 MG immediate release tablet Commonly known as: Oxy IR/ROXICODONE     TAKE these medications   aluminum-magnesium hydroxide-simethicone 287-867-67 MG/5ML Susp Commonly known as: MAALOX Take 15-30 mLs by mouth 3 (three) times daily as needed (for heartburn or indigestion).   amoxicillin-clavulanate 875-125 MG tablet Commonly known as: AUGMENTIN Take 1  tablet by mouth every 12 (twelve) hours.   atenolol-chlorthalidone 50-25 MG tablet Commonly known as: TENORETIC Take 1 tablet by mouth daily. Pt needs appt for further refills. What changed: additional instructions   cloNIDine 0.1 MG tablet Commonly known as: CATAPRES Take 1 tablet (0.1 mg total) by mouth daily. What changed: when to take this   DULoxetine 30 MG capsule Commonly known as: CYMBALTA Take 2 capsules (60 mg total) by mouth daily. What changed: when to take this   METAMUCIL FIBER PO Take by mouth See admin instructions. Mix 1 teaspoonful of powder into 6-8 ounces of water and drink once a day as needed for mild constipation   metFORMIN 500 MG tablet Commonly known as: GLUCOPHAGE Take 0.5 tablets (250 mg total) by mouth daily with breakfast.   multivitamin with minerals Tabs tablet Take 1 tablet by mouth daily with breakfast.   oxyCODONE-acetaminophen 7.5-325 MG tablet Commonly known as: PERCOCET Take 2 tablets by mouth See admin instructions. Take 2 (TWO) tablets by mouth in the morning at 7 AM and in the evening at 6 PM   polyethylene glycol 17 g packet Commonly known as: MIRALAX / GLYCOLAX Take 17 g by mouth 2 (two) times daily.   potassium chloride SA 20 MEQ tablet Commonly known as: KLOR-CON Take 1 tablet (20 mEq total) by mouth daily.    saccharomyces boulardii 250 MG capsule Commonly known as: FLORASTOR Take 1 capsule (250 mg total) by mouth 2 (two) times daily.   senna-docusate 8.6-50 MG tablet Commonly known as: Senokot-S Take 2 tablets by mouth 2 (two) times daily.   simvastatin 40 MG tablet Commonly known as: Zocor Take 1 tablet (40 mg total) by mouth daily. What changed: when to take this      Allergies  Allergen Reactions  . Aspirin Other (See Comments)    Irritates the stomach and the patient developed ulcers, also  . Lisinopril Other (See Comments)    Caused a body ache    Follow-up Information    [facility] in 1 week(s).   Why: hospital discharge follow up, repeat cbc/bmp at follow up Contact information: [place] [address] [place], [place] Follow up.   Specialty: Behavioral Health Why: Therapy services.  Contact information: [place] [address] [phone]        [name], [name], MD Follow up.   Specialty: Family Medicine Why: hospital discharge follow up, repeat cbc/bmp at follow up Contact information: [place] [address] [phone]        [facility] Follow up.   Specialty: Home Health Services Contact information: [place] [address] [phone]                The results of significant diagnostics from this hospitalization (including imaging, microbiology, ancillary and laboratory) are listed below for reference.    Significant Diagnostic Studies: CT CHEST WO CONTRAST  Result Date: 11/22/2020 CLINICAL DATA:  70 year old male with history of abnormal chest x-ray. Apparent parapneumonic pleural effusion. EXAM: CT CHEST WITHOUT CONTRAST TECHNIQUE: Multidetector CT imaging of the chest was performed following the standard protocol without IV contrast. COMPARISON:  Chest CT 11/19/2020. FINDINGS:  Cardiovascular: Heart size is normal. There is no significant pericardial fluid, thickening or pericardial calcification. There is aortic atherosclerosis, as well as atherosclerosis of the great vessels of the mediastinum and the coronary arteries,  including calcified atherosclerotic plaque in the left main, left anterior descending and left circumflex coronary arteries. Mediastinum/Nodes: No pathologically enlarged mediastinal or hilar lymph nodes. Please note that accurate exclusion of hilar adenopathy is limited on noncontrast CT scans. Esophagus is unremarkable in appearance. No axillary lymphadenopathy. Lungs/Pleura: Compared to the prior examination, there has been interval placement of a small bore pigtail drainage catheter into the left pleural space posteriorly. The pleural fluid around the tip of the catheter has been evacuated when compared to the prior examination. Unfortunately, there continues to be a significant volume of loculated fluid in the left major fissure, particularly superiorly where the largest portion of this collection measures up to 7.3 x 4.4 x 6.5 cm (axial image 20 of series 3 and sagittal image 99 of series 8). Areas of developing scarring and/or atelectasis are noted in the periphery of the left lung. Trace volume of pneumothorax in the left pleural space, presumably iatrogenic related to indwelling chest tube. 5 mm left upper lobe pulmonary nodule (axial image 65 of series 6), stable compared to the recent prior examination, but increased in size compared to more remote prior study from 06/26/2020 (previously only 2 mm). 3 mm right middle lobe pulmonary nodule (axial image 88 of series 6), stable compared to prior study from 06/26/2020, nonspecific, but statistically likely benign. Right lung is otherwise clear. Upper Abdomen: Aortic atherosclerosis. Musculoskeletal: There are no aggressive appearing lytic or blastic lesions noted in the visualized portions of the skeleton.  IMPRESSION: 1. Interval placement of pigtail drainage catheter in the left pleural space with residual loculated hydropneumothorax. Although the pleural catheter has successfully drained much of the fluid around the periphery of the left lung, there is a significant loculated component in the superior aspect of the left major fissure, as detailed above. 2. Small pulmonary nodules in the lungs bilaterally, most notable for a 5 mm left upper lobe pulmonary nodule. This is nonspecific and is stable compared to the most recent prior examination, but has increased in size compared to 06/26/2020. Attention on future follow-up imaging is recommended to ensure stability. 3. Aortic atherosclerosis, in addition to left main and 2 vessel coronary artery disease. Please note that although the presence of coronary artery calcium documents the presence of coronary artery disease, the severity of this disease and any potential stenosis cannot be assessed on this non-gated CT examination. Assessment for potential risk factor modification, dietary therapy or pharmacologic therapy may be warranted, if clinically indicated. Aortic Atherosclerosis (ICD10-I70.0). Electronically Signed   By: Vinnie Langton M.D.   On: 11/22/2020 10:46   CT Angio Chest PE W and/or Wo Contrast  Result Date: 11/19/2020 CLINICAL DATA:  70 year old male status post fall yesterday striking left ribs again scar. Pain. Shallow inspiration. EXAM: CT ANGIOGRAPHY CHEST WITH CONTRAST TECHNIQUE: Multidetector CT imaging of the chest was performed using the standard protocol during bolus administration of intravenous contrast. Multiplanar CT image reconstructions and MIPs were obtained to evaluate the vascular anatomy. CONTRAST:  112mL OMNIPAQUE IOHEXOL 350 MG/ML SOLN COMPARISON:  Portable chest 0436 hours today. CTA chest 06/26/2020. 01/19/2012 CT Abdomen and Pelvis FINDINGS: Cardiovascular: Adequate contrast bolus timing in the pulmonary arterial tree.  Respiratory motion, respiratory motion, pronounced at the lung bases and also the right hilum. No central pulmonary thrombus, but in segmental branches of the right upper lobe (series 6, image 101) and both lower lobes (series 6, image 154 and 161) there is suggestion of small volume nonocclusive clot. However, saddle and hilar embolus was  demonstrated in September. Therefore, this is likely the residual of previous pulmonary embolus. Stable cardiac size at the upper limits of normal. No pericardial effusion. Negative visible aorta aside from tortuosity and minimal plaque. Mediastinum/Nodes: Negative.  No mediastinal hematoma. Lungs/Pleura: Mostly loculated, minimally layering left pleural effusion with mildly hyperdense fluid loculated in the left lateral chest, but low-density appearing fluid elsewhere. Some fluid trapped in the major fissure. Superimposed confluent left lower lobe heterogeneous consolidation, where the left lower lobe was clear in September. Left major airways remain patent. No left pneumothorax. Right lung is negative aside from mild atelectasis. Upper Abdomen: Negative visible liver, gallbladder, spleen, pancreas, and bowel. Stable left adrenal thickening/nodularity greater on the left. Density there is fairly low (12 Hounsfield units, and some macroscopic fat seems to be visible (series 5, image 85) and furthermore this has not significantly changed since CT Abdomen and Pelvis 01/19/2012, consistent with benign adenoma. Stable visible kidneys, benign-appearing left upper pole cyst. Musculoskeletal: No left-side rib fracture is identified. There is severe degeneration in the spine with S-shaped thoracolumbar scoliosis and chronic thoracolumbar junction laminectomy changes. Stable visualized osseous structures. Review of the MIP images confirms the above findings. IMPRESSION: 1. Negative for acute pulmonary embolus but positive for small volume nonocclusive chronic PE, the sequelae of saddle  embolus demonstrated in September. 2. Mixed loculated > layering left Pleural Effusion. Much of the fluid has low-density, but Hemothorax is difficult to exclude in the setting of trauma. However, no overlying rib fracture is identified. 3. Superimposed left lower lobe consolidation more suggestive of Pneumonia than Pulmonary Contusion. Pulmonary infarct persisting since September is unlikely. 4. Stable benign adrenal thickening, left adrenal adenoma. Electronically Signed   By: Genevie Ann M.D.   On: 11/19/2020 05:49   DG Chest Port 1 View  Result Date: 11/21/2020 CLINICAL DATA:  Chest tube. EXAM: PORTABLE CHEST 1 VIEW COMPARISON:  11/20/2020 FINDINGS: Pigtail type catheter overlies the LEFT lung base, stable in appearance. There is persistent opacity throughout the LEFT LOWER lobe. Focal lucency along the LATERAL aspect of the LEFT hemithorax appears stable, consistent with small loculated pneumothorax. Loculated pleural effusion projects to the LEFT of the aortic knob, within the major fissure, seen on CT exam. Shallow lung inflation.  No pulmonary edema. IMPRESSION: 1. Persistent significant opacity throughout the LEFT LOWER lobe. 2. Persistent loculated pleural effusion. 3. Persistent small loculated pneumothorax. Electronically Signed   By: Nolon Nations M.D.   On: 11/21/2020 08:07   DG CHEST PORT 1 VIEW  Result Date: 11/20/2020 CLINICAL DATA:  70 year old male with recent fall, left pleural effusion and airspace disease. Status post CT-guided left chest tube placement yesterday. EXAM: PORTABLE CHEST 1 VIEW COMPARISON:  CTA chest 11/19/2020 and earlier. FINDINGS: Portable AP semi upright view at 0419 hours. Pigtail left chest tube now in place. Skin fold artifacts suspected in the left chest. No significant pneumothorax although there may be a small focus of loculated pleural gas laterally (arrow). Continued low lung volumes. Patchy and confluent left lung base opacity persists. Stable cardiac size and  mediastinal contours. No overt edema. Scoliosis. Stable visualized osseous structures. Negative visible bowel gas pattern. IMPRESSION: 1. Left chest tube in place with no significant pneumothorax; trace loculated pleural gas laterally. 2. Continued low lung volumes and left lung base opacity. Electronically Signed   By: Genevie Ann M.D.   On: 11/20/2020 07:00   DG Chest Port 1 View  Result Date: 11/19/2020 CLINICAL DATA:  Chest pain post fall. Fall yesterday from  car. Left-sided pain radiating to the back. EXAM: PORTABLE CHEST 1 VIEW COMPARISON:  Radiograph and CT 06/26/2020 FINDINGS: Low lung volumes. Upper normal heart size, unchanged from prior exam. Aortic tortuosity is unchanged. Left pleural effusion which partially tracks laterally. Patchy retrocardiac opacity. No visualized pneumothorax. Right lung is clear. Scoliotic curvature of the spine. No visualized left rib fractures. IMPRESSION: Left pleural effusion with patchy retrocardiac opacity, may be contusion in the setting of injury. No visualized pneumothorax or left rib fractures. Electronically Signed   By: Keith Rake M.D.   On: 11/19/2020 03:53   CT IMAGE GUIDED DRAINAGE BY PERCUTANEOUS CATHETER  Result Date: 11/19/2020 INDICATION: Left loculated effusion EXAM: CT-GUIDED POSTERIOR LEFT 10 FRENCH CHEST TUBE PLACEMENT MEDICATIONS: The patient is currently admitted to the hospital and receiving intravenous antibiotics. The antibiotics were administered within an appropriate time frame prior to the initiation of the procedure. ANESTHESIA/SEDATION: Fentanyl 50 mcg IV; Versed 1.0 mg IV Moderate Sedation Time:  11 MINUTES The patient was continuously monitored during the procedure by the interventional radiology nurse under my direct supervision. COMPLICATIONS: None immediate. PROCEDURE: Informed written consent was obtained from the patient after a thorough discussion of the procedural risks, benefits and alternatives. All questions were addressed.  Maximal Sterile Barrier Technique was utilized including caps, mask, sterile gowns, sterile gloves, sterile drape, hand hygiene and skin antiseptic. A timeout was performed prior to the initiation of the procedure. previous imaging reviewed. patient position right side down decubitus. noncontrast localization ct performed. the left complex loculated effusion was localized and marked for a posterolateral approach. under sterile conditions and local anesthesia, an 18 gauge introducer needle was advanced from a lateral intercostal approach into the effusion. needle position confirmed with ct. syringe aspiration yielded serous pleural fluid. sample sent for culture. guidewire inserted followed by tract dilatation to insert a 10 french drain. drain catheter position confirmed with ct. catheter secured with prolene suture and connected to external pleura vac. sterile dressing applied. no immediate complication. patient tolerated the procedure well. IMPRESSION: Successful CT-guided 10 French left chest tube insertion Electronically Signed   By: Jerilynn Mages.  Shick M.D.   On: 11/19/2020 13:52    Microbiology: Recent Results (from the past 240 hour(s))  Blood culture (routine x 2)     Status: None   Collection Time: 11/19/20  6:40 AM   Specimen: BLOOD LEFT HAND  Result Value Ref Range Status   Specimen Description BLOOD LEFT HAND  Final   Special Requests   Final    BOTTLES DRAWN AEROBIC AND ANAEROBIC Blood Culture adequate volume   Culture   Final    NO GROWTH 5 DAYS Performed at [facility], [address], [place], [place] [address]    Report Status 11/24/2020 FINAL  Final  Resp Panel by RT-PCR (Flu A&B, Covid) Nasopharyngeal Swab     Status: None   Collection Time: 11/19/20  8:53 AM   Specimen: Nasopharyngeal Swab; Nasopharyngeal(NP) swabs in vial transport medium  Result Value Ref Range Status   SARS Coronavirus 2 by RT PCR NEGATIVE NEGATIVE Final    Comment: (NOTE) SARS-CoV-2 target nucleic acids  are NOT DETECTED.  The SARS-CoV-2 RNA is generally detectable in upper respiratory specimens during the acute phase of infection. The lowest concentration of SARS-CoV-2 viral copies this assay can detect is 138 copies/mL. A negative result does not preclude SARS-Cov-2 infection and should not be used as the sole basis for treatment or other patient management decisions. A negative result may occur  with  improper specimen collection/handling, submission of specimen other than nasopharyngeal swab, presence of viral mutation(s) within the areas targeted by this assay, and inadequate number of viral copies(<138 copies/mL). A negative result must be combined with clinical observations, patient history, and epidemiological information. The expected result is Negative.  Fact Sheet for Patients:  EntrepreneurPulse.com.au  Fact Sheet for Healthcare Providers:  IncredibleEmployment.be  This test is no t yet approved or cleared by the [place] FDA and  has been authorized for detection and/or diagnosis of SARS-CoV-2 by FDA under an Emergency Use Authorization (EUA). This EUA will remain  in effect (meaning this test can be used) for the duration of the COVID-19 declaration under Section 564(b)(1) of the Act, 21 U.S.C.section 360bbb-3(b)(1), unless the authorization is terminated  or revoked sooner.       Influenza A by PCR NEGATIVE NEGATIVE Final   Influenza B by PCR NEGATIVE NEGATIVE Final    Comment: (NOTE) The Xpert Xpress SARS-CoV-2/FLU/RSV plus assay is intended as an aid in the diagnosis of influenza from Nasopharyngeal swab specimens and should not be used as a sole basis for treatment. Nasal washings and aspirates are unacceptable for Xpert Xpress SARS-CoV-2/FLU/RSV testing.  Fact Sheet for Patients: EntrepreneurPulse.com.au  Fact Sheet for Healthcare Providers: IncredibleEmployment.be  This test is  not yet approved or cleared by the [place] FDA and has been authorized for detection and/or diagnosis of SARS-CoV-2 by FDA under an Emergency Use Authorization (EUA). This EUA will remain in effect (meaning this test can be used) for the duration of the COVID-19 declaration under Section 564(b)(1) of the Act, 21 U.S.C. section 360bbb-3(b)(1), unless the authorization is terminated or revoked.  Performed at [facility], [place] [address], [place], [place] [address]   Culture, sputum-assessment     Status: None   Collection Time: 11/19/20 12:24 PM   Specimen: Sputum  Result Value Ref Range Status   Specimen Description EXPSU  Final   Special Requests Normal  Final   Sputum evaluation   Final    THIS SPECIMEN IS ACCEPTABLE FOR SPUTUM CULTURE Performed at [facility], [address], [place], [place] [address]    Report Status 11/19/2020 FINAL  Final  Culture, respiratory     Status: None   Collection Time: 11/19/20 12:24 PM  Result Value Ref Range Status   Specimen Description EXPSU  Final   Special Requests Normal Reflexed from [id number]  Final   Gram Stain   Final    ABUNDANT WBC PRESENT, PREDOMINANTLY PMN RARE SQUAMOUS EPITHELIAL CELLS PRESENT MODERATE GRAM NEGATIVE RODS FEW GRAM POSITIVE COCCI IN PAIRS    Culture   Final    RARE Normal respiratory flora-no Staph aureus or Pseudomonas seen Performed at [facility], [place] [address] [place], [place] [address]    Report Status 11/21/2020 FINAL  Final  Body fluid culture     Status: None (Preliminary result)   Collection Time: 11/19/20  1:33 PM   Specimen: Pleura  Result Value Ref Range Status   Specimen Description PLEURAL FLUID  Final   Special Requests LEFT  Final   Gram Stain   Final    ABUNDANT WBC PRESENT, PREDOMINANTLY PMN NO ORGANISMS SEEN    Culture   Final    NO GROWTH 2 DAYS Performed at [facility], [place] [address] [place], [place] [address]    Report Status PENDING  Incomplete   Body fluid culture w Gram Stain  Status: None   Collection Time: 11/19/20  1:33 PM   Specimen: Pleura  Result Value Ref Range Status   Specimen Description PLEURAL FLUID  Final   Special Requests LEFT  Final   Gram Stain   Final    ABUNDANT WBC PRESENT, PREDOMINANTLY PMN NO ORGANISMS SEEN    Culture   Final    NO GROWTH 3 DAYS Performed at [facility], [place] [address], [place], [place] [address]    Report Status 11/22/2020 FINAL  Final  SARS CORONAVIRUS 2 (TAT 6-24 HRS) Nasopharyngeal Nasopharyngeal Swab     Status: None   Collection Time: 11/23/20  4:23 PM   Specimen: Nasopharyngeal Swab  Result Value Ref Range Status   SARS Coronavirus 2 NEGATIVE NEGATIVE Final    Comment: (NOTE) SARS-CoV-2 target nucleic acids are NOT DETECTED.  The SARS-CoV-2 RNA is generally detectable in upper and lower respiratory specimens during the acute phase of infection. Negative results do not preclude SARS-CoV-2 infection, do not rule out co-infections with other pathogens, and should not be used as the sole basis for treatment or other patient management decisions. Negative results must be combined with clinical observations, patient history, and epidemiological information. The expected result is Negative.  Fact Sheet for Patients: SugarRoll.be  Fact Sheet for Healthcare Providers: https://www.woods-mathews.com/  This test is not yet approved or cleared by the [place] FDA and  has been authorized for detection and/or diagnosis of SARS-CoV-2 by FDA under an Emergency Use Authorization (EUA). This EUA will remain  in effect (meaning this test can be used) for the duration of the COVID-19 declaration under Se ction 564(b)(1) of the Act, 21 U.S.C. section 360bbb-3(b)(1), unless the authorization is terminated or revoked sooner.  Performed at [facility], [place] [address], [place], [place] [address]      Labs: Basic Metabolic  Panel: Recent Labs  Lab 11/20/20 0122 11/21/20 0718 11/22/20 0131 11/23/20 0155 11/24/20 0107  NA 138 140 137 137 133*  K 3.1* 3.3* 3.4* 3.3* 3.8  CL 101 99 101 101 98  CO2 26 22 25 26 24   GLUCOSE 177* 46* 141* 111* 130*  BUN 16 17 16 11 11   CREATININE 1.21 1.22 1.11 1.06 1.02  CALCIUM 9.5 9.7 9.1 9.5 9.3  MG  --  2.1  --   --  1.8  PHOS  --  2.5  --   --   --    Liver Function Tests: No results for input(s): AST, ALT, ALKPHOS, BILITOT, PROT, ALBUMIN in the last 168 hours. No results for input(s): LIPASE, AMYLASE in the last 168 hours. No results for input(s): AMMONIA in the last 168 hours. CBC: Recent Labs  Lab 11/19/20 0325 11/19/20 0419 11/20/20 0122 11/21/20 0200 11/22/20 0131 11/23/20 0155  WBC 10.4  --  17.9* 14.9* 11.7* 9.5  NEUTROABS 8.4*  --  14.7*  --   --   --   HGB 12.4* 13.6 12.4* 11.6* 11.1* 10.3*  HCT 39.0 40.0 38.3* 36.7* 33.4* 31.3*  MCV 90.3  --  88.0 91.8 88.1 88.2  PLT 446*  --  460* 370 393 414*   Cardiac Enzymes: No results for input(s): CKTOTAL, CKMB, CKMBINDEX, TROPONINI in the last 168 hours. BNP: BNP (last 3 results) Recent Labs    06/26/20 2001  BNP 92.3    ProBNP (last 3 results) No results for input(s): PROBNP in the last 8760 hours.  CBG: Recent Labs  Lab 11/23/20 1209 11/23/20 1641 11/23/20 2255  11/24/20 0726 11/24/20 1138  GLUCAP 126* 91 123* 125* 85       Signed:  Florencia Reasons MD, PhD, FACP  [facility] 11/24/2020, 4:49 PM

## 2020-11-24 NOTE — Progress Notes (Signed)
[name] to be D/C'd  per MD order. Discussed with the patient and all questions fully answered.  VSS, Skin clean, dry and intact without evidence of skin break down, no evidence of skin tears noted.  IV catheter discontinued intact. Site without signs and symptoms of complications. Dressing and pressure applied.  An After Visit Summary was printed and given to the patient. Patient received prescription.  D/c education completed with patient/family including follow up instructions, medication list, d/c activities limitations if indicated, with other d/c instructions as indicated by MD - patient able to verbalize understanding, all questions fully answered.   Patient instructed to return to ED, call 911, or call MD for any changes in condition.   Patient to be escorted via [place], and D/C home via private auto.

## 2020-11-24 NOTE — Progress Notes (Signed)
SATURATION QUALIFICATIONS: (This note is used to comply with regulatory documentation for home oxygen)  Patient Saturations on Room Air at Rest = 98%  Patient Saturations on Room Air while Ambulating = 98%  Patient Saturations on 0 Liters of oxygen while Ambulating = 98%  Please briefly explain why patient needs home oxygen:  Patients o2 sat did not decrease during ambulation. Does not require oxygen to ambulate.

## 2020-11-25 ENCOUNTER — Telehealth: Payer: Self-pay | Admitting: Family Medicine

## 2020-11-25 DIAGNOSIS — E119 Type 2 diabetes mellitus without complications: Secondary | ICD-10-CM | POA: Diagnosis not present

## 2020-11-25 DIAGNOSIS — I10 Essential (primary) hypertension: Secondary | ICD-10-CM | POA: Diagnosis not present

## 2020-11-25 DIAGNOSIS — Z9181 History of falling: Secondary | ICD-10-CM | POA: Diagnosis not present

## 2020-11-25 DIAGNOSIS — E785 Hyperlipidemia, unspecified: Secondary | ICD-10-CM | POA: Diagnosis not present

## 2020-11-25 DIAGNOSIS — R261 Paralytic gait: Secondary | ICD-10-CM | POA: Diagnosis not present

## 2020-11-25 DIAGNOSIS — Z7984 Long term (current) use of oral hypoglycemic drugs: Secondary | ICD-10-CM | POA: Diagnosis not present

## 2020-11-25 DIAGNOSIS — J90 Pleural effusion, not elsewhere classified: Secondary | ICD-10-CM | POA: Diagnosis not present

## 2020-11-25 DIAGNOSIS — Z86711 Personal history of pulmonary embolism: Secondary | ICD-10-CM | POA: Diagnosis not present

## 2020-11-25 NOTE — Telephone Encounter (Signed)
Transition Care Management Follow-up Telephone Call  Date of discharge and from where: 11/24/2020 from [facility]   How have you been since you were released from the hospital? Patient states he is doing pretty good. Was having a current PT session with [facility] at the time of the call  Any questions or concerns? No  Items Reviewed:  Did the pt receive and understand the discharge instructions provided? Yes   Medications obtained and verified? Yes   Other? No   Any new allergies since your discharge? No   Dietary orders reviewed? Yes  Do you have support at home? Yes   Home Care and Equipment/Supplies: Were home health services ordered? yes If so, what is the name of the agency? [facility]   Has the agency set up a time to come to the patient's home? yes Were any new equipment or medical supplies ordered?  No What is the name of the medical supply agency? N/A  Were you able to get the supplies/equipment? not applicable Do you have any questions related to the use of the equipment or supplies? No  Functional Questionnaire: (I = Independent and D = Dependent) ADLs: I  Bathing/Dressing- I  Meal Prep- I  Eating- I  Maintaining continence- I  Transferring/Ambulation- I  Managing Meds- I  Follow up appointments reviewed:   PCP Hospital f/u appt confirmed? Yes  Scheduled to see Dr. [name]  on 12/02/2020 @ 10:30 am.  [facility] f/u appt confirmed? Yes  Scheduled to see Dr. Silas Flood  on 12/28/2020  @ 10:00 am.  Are transportation arrangements needed? No   If their condition worsens, is the pt aware to call PCP or go to the Emergency Dept.? Yes  Was the patient provided with contact information for the PCP's office or ED? Yes  Was to pt encouraged to call back with questions or concerns? Yes

## 2020-11-28 ENCOUNTER — Other Ambulatory Visit: Payer: Self-pay

## 2020-11-29 ENCOUNTER — Ambulatory Visit (INDEPENDENT_AMBULATORY_CARE_PROVIDER_SITE_OTHER): Payer: Medicare Other | Admitting: Family Medicine

## 2020-11-29 ENCOUNTER — Encounter: Payer: Self-pay | Admitting: Family Medicine

## 2020-11-29 VITALS — BP 124/80 | HR 90 | Resp 16 | Ht 72.0 in

## 2020-11-29 DIAGNOSIS — J189 Pneumonia, unspecified organism: Secondary | ICD-10-CM

## 2020-11-29 DIAGNOSIS — E876 Hypokalemia: Secondary | ICD-10-CM | POA: Diagnosis not present

## 2020-11-29 DIAGNOSIS — E1140 Type 2 diabetes mellitus with diabetic neuropathy, unspecified: Secondary | ICD-10-CM | POA: Diagnosis not present

## 2020-11-29 DIAGNOSIS — Z9181 History of falling: Secondary | ICD-10-CM | POA: Diagnosis not present

## 2020-11-29 DIAGNOSIS — Z7984 Long term (current) use of oral hypoglycemic drugs: Secondary | ICD-10-CM | POA: Diagnosis not present

## 2020-11-29 DIAGNOSIS — R110 Nausea: Secondary | ICD-10-CM | POA: Diagnosis not present

## 2020-11-29 DIAGNOSIS — J90 Pleural effusion, not elsewhere classified: Secondary | ICD-10-CM

## 2020-11-29 DIAGNOSIS — I10 Essential (primary) hypertension: Secondary | ICD-10-CM

## 2020-11-29 DIAGNOSIS — Z86711 Personal history of pulmonary embolism: Secondary | ICD-10-CM | POA: Diagnosis not present

## 2020-11-29 DIAGNOSIS — E119 Type 2 diabetes mellitus without complications: Secondary | ICD-10-CM | POA: Diagnosis not present

## 2020-11-29 DIAGNOSIS — R261 Paralytic gait: Secondary | ICD-10-CM | POA: Diagnosis not present

## 2020-11-29 DIAGNOSIS — E785 Hyperlipidemia, unspecified: Secondary | ICD-10-CM | POA: Diagnosis not present

## 2020-11-29 LAB — BASIC METABOLIC PANEL
BUN: 14 mg/dL (ref 6–23)
CO2: 28 mEq/L (ref 19–32)
Calcium: 10.5 mg/dL (ref 8.4–10.5)
Chloride: 101 mEq/L (ref 96–112)
Creatinine, Ser: 1.23 mg/dL (ref 0.40–1.50)
GFR: 60.39 mL/min (ref >60.00)
Glucose, Bld: 113 mg/dL — ABNORMAL HIGH (ref 70–99)
Potassium: 4.8 mEq/L (ref 3.5–5.1)
Sodium: 138 mEq/L (ref 135–145)

## 2020-11-29 LAB — CBC
HCT: 34.3 % — ABNORMAL LOW (ref 39.0–52.0)
Hemoglobin: 11.3 g/dL — ABNORMAL LOW (ref 13.0–17.0)
MCHC: 33 g/dL (ref 30.0–36.0)
MCV: 89 fl (ref 78.0–100.0)
Platelets: 641 10*3/uL — ABNORMAL HIGH (ref 150.0–400.0)
RBC: 3.85 Mil/uL — ABNORMAL LOW (ref 4.22–5.81)
RDW: 15.7 % — ABNORMAL HIGH (ref 11.5–15.5)
WBC: 9.1 10*3/uL (ref 4.0–10.5)

## 2020-11-29 NOTE — Patient Instructions (Signed)
A few things to remember from today's visit:   Type 2 diabetes mellitus with diabetic neuropathy, without long-term current use of insulin (HCC)  Hypokalemia - Plan: Basic metabolic panel  Essential hypertension - Plan: CBC  Loculated pleural effusion  If you need refills please call your pharmacy. Do not use My Chart to request refills or for acute issues that need immediate attention.   Continue increasing physical activity as tolerated. Incentive spirometry a few times per day. You have appt for chest CT and to see surgeon in March.  No changes in your current medications.  Please be sure medication list is accurate. If a new problem present, please set up appointment sooner than planned today.

## 2020-11-29 NOTE — Progress Notes (Unsigned)
HPI:   [name] is a 70 y.o. male, who is here today with his wife to follow on recent hospitalization. TCM follow up call on 11/25/20.  He was admitted on 11/19/2020 and discharged home on 11/24/2020. He presented to the ER complaining of persistent cough, CP, hemoptysis, and a fall. On 11/18/20 he slip and fell from his car, hit rib cage against car. For a couple days before visit he was coughing , episode of hemoptysis prompted him to call EMS.  Dx'ed with CAP with left pleural effusion. Pleural fibrinolytic administration on 11/19/2020.  Pulse O2 at home and at RA 95-98%. Negative for fever,chills,cough,or wheezing. He is breathing "much better." He is on Augmentin 875-125 mg bid and recommended to complete 30 days. Abx causes mild nausea. He is also taking a probiotic. He has not noted abdominal pain,diarreha,or vomiting.  Chest CT on 11/19/2020 showed left pleural effusion, negative for PE. 1. 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 2. Mixed loculated > layering left Pleural Effusion. Much of the fluid has low-density, but Hemothorax is difficult to exclude in the setting of trauma. However, no overlying rib fracture is identified. 3. Superimposed left lower lobe consolidation more suggestive of pneumonia than Pulmonary Contusion. Pulmonary infarct persisting since September is unlikely.  4. Stable benign adrenal thickening, left adrenal adenoma. Still feeling fatigue. Mild shortness of breath with exertion. Negative for fever, chills, cough, or CP. He has not checked thoracic wound. He is on Augmentin 875-125 mg bid to complete 30 days.  Psychiatric evaluation during hospitalization, mediation was not deemed necessary. States that he was feeling depressed because health complications/problems since 06/2020. He does not think medication is needed.  PT  x2/week. He is using his walker.  HTN:He is on Atenolol-Chlorthalidone 50-25 mg daily and clonidine 0.1 mg daily.  HypoK+: He is on K-Lor 20 mEq daily. Negative for severe/frequent headache, visual changes,palpitation, focal weakness, or edema.  Lab Results  Component Value Date   CREATININE 1.02 11/24/2020   BUN 11 11/24/2020   NA 133 (L) 11/24/2020   K 3.8 11/24/2020   CL 98 11/24/2020   CO2 24 11/24/2020   Lab Results  Component Value Date   WBC 9.5 11/23/2020   HGB 10.3 (L) 11/23/2020   HCT 31.3 (L) 11/23/2020   MCV 88.2 11/23/2020   PLT 414 (H) 11/23/2020   He has an appointment for chest CT and with cardiothoracic surgeon in 12/2020.  DM II:He is on Metformin 500 mg 1/2 tab daily. Negative for abdominal pain, nausea,vomiting, polydipsia,polyuria, or polyphagia.  Lab Results  Component Value Date   HGBA1C 6.2 (H) 11/21/2020    Review of Systems  Constitutional: Positive for fatigue. Negative for activity change, appetite change, fever and unexpected weight change.  HENT: Negative for nosebleeds, sore throat and trouble swallowing.   Eyes: Negative for redness and visual disturbance.  Respiratory: Positive for shortness of breath. Negative for cough and wheezing.   Cardiovascular: Negative for chest pain, palpitations and leg swelling.  Gastrointestinal: Positive for nausea. Negative for abdominal pain and vomiting.  Genitourinary: Negative for decreased urine volume, dysuria and hematuria.  Neurological: Negative for dizziness, seizures, weakness, numbness and headaches.  Rest see pertinent positives and negatives per HPI.  Current Outpatient Medications on File Prior to Visit  Medication Sig Dispense Refill  . aluminum-magnesium hydroxide-simethicone (MAALOX) 128-786-76 MG/5ML SUSP Take 15-30 mLs by mouth 3 (three)  times daily as needed (for heartburn or indigestion).    Marland Kitchen amoxicillin-clavulanate (AUGMENTIN) 875-125 MG tablet Take 1 tablet by mouth every 12  (twelve) hours. 60 tablet 0  . atenolol-chlorthalidone (TENORETIC) 50-25 MG tablet Take 1 tablet by mouth daily. Pt needs appt for further refills. (Patient taking differently: Take 1 tablet by mouth daily.) 90 tablet 2  . cloNIDine (CATAPRES) 0.1 MG tablet Take 1 tablet (0.1 mg total) by mouth daily. (Patient taking differently: Take 0.1 mg by mouth in the morning.) 90 tablet 2  . DULoxetine (CYMBALTA) 30 MG capsule Take 2 capsules (60 mg total) by mouth daily. (Patient taking differently: Take 60 mg by mouth in the morning.) 30 capsule 3  . METAMUCIL FIBER PO Take by mouth See admin instructions. Mix 1 teaspoonful of powder into 6-8 ounces of water and drink once a day as needed for mild constipation    . metFORMIN (GLUCOPHAGE) 500 MG tablet Take 0.5 tablets (250 mg total) by mouth daily with breakfast. 45 tablet 2  . Multiple Vitamin (MULITIVITAMIN WITH MINERALS) TABS Take 1 tablet by mouth daily with breakfast.    . oxyCODONE-acetaminophen (PERCOCET) 7.5-325 MG tablet Take 2 tablets by mouth See admin instructions. Take 2 (TWO) tablets by mouth in the morning at 7 AM and in the evening at 6 PM    . polyethylene glycol (MIRALAX / GLYCOLAX) 17 g packet Take 17 g by mouth 2 (two) times daily. 14 each 0  . potassium chloride SA (KLOR-CON) 20 MEQ tablet Take 1 tablet (20 mEq total) by mouth daily. 30 tablet 0  . saccharomyces boulardii (FLORASTOR) 250 MG capsule Take 1 capsule (250 mg total) by mouth 2 (two) times daily. 60 capsule 0  . senna-docusate (SENOKOT-S) 8.6-50 MG tablet Take 2 tablets by mouth 2 (two) times daily.    . simvastatin (ZOCOR) 40 MG tablet Take 1 tablet (40 mg total) by mouth daily. (Patient taking differently: Take 40 mg by mouth every evening.) 90 tablet 2   No current facility-administered medications on file prior to visit.    Past Medical History:  Diagnosis Date  . Allergy   . Chronic back pain   . Diabetes mellitus without complication ([place])   . DNR (do not  resuscitate) 11/19/2020  . Duodenal ulcer hemorrhage 08/29/2014  . ED (erectile dysfunction)   . Esophageal stricture 08/30/2014  . Glucose intolerance (impaired glucose tolerance)   . Hiatal hernia 08/30/2014  . Hypertension   . Hypokalemia 04/11/2013  . 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 Obesity   . Osteoarthritis   . Pulmonary embolism ([place]) 06/2020  . Scoliosis 2016  . Spinal stenosis of lumbar region   . Urinary tract infection 04/11/2013   Allergies  Allergen Reactions  . Aspirin Other (See Comments)    Irritates the stomach and the patient developed ulcers, also  . Lisinopril Other (See Comments)    Caused a body ache    Social History   Socioeconomic History  . Marital status: Married    Spouse name: Not on file  . Number of children: Not on file  . Years of education: Not on file  . Highest education level: Not on file  Occupational History  . Occupation: retired  Tobacco Use  . Smoking status: Former Smoker    Packs/day: 1.00    Years: 25.00    Pack years: 25.00    Quit date: 1995    Years since quitting: 27.1  . Smokeless tobacco: Never Used  Vaping Use  .  Vaping Use: Never used  Substance and Sexual Activity  . Alcohol use: Yes    Comment: holidays and special occasions  . Drug use: No  . Sexual activity: Yes    Birth control/protection: None  Other Topics Concern  . Not on file  Social History Narrative   Married.     Social Determinants of Health   Financial Resource Strain: Not on file  Food Insecurity: Not on file  Transportation Needs: Not on file  Physical Activity: Not on file  Stress: Not on file  Social Connections: Not on file   Vitals:   11/29/20 1047  BP: 124/80  Pulse: 90  Resp: 16  SpO2: 97%   Body mass index is 36.62 kg/m.  Physical Exam Vitals and nursing note reviewed.  Constitutional:      General: He is not in acute distress.    Appearance: He is well-developed.  HENT:     Head: Normocephalic and atraumatic.      Mouth/Throat:     Mouth: Mucous membranes are moist.     Pharynx: Oropharynx is clear.  Eyes:     Conjunctiva/sclera: Conjunctivae normal.  Cardiovascular:     Rate and Rhythm: Normal rate and regular rhythm.     Pulses:          Posterior tibial pulses are 2+ on the right side.     Heart sounds: No murmur heard.     Comments: Left ankle brace. Pulmonary:     Effort: Pulmonary effort is normal. No respiratory distress.     Breath sounds: Normal breath sounds.  Abdominal:     Palpations: Abdomen is soft. There is no mass.     Tenderness: There is no abdominal tenderness.  Musculoskeletal:       Back:  Lymphadenopathy:     Cervical: No cervical adenopathy.  Skin:    General: Skin is warm.     Findings: No erythema or rash.  Neurological:     Mental Status: He is alert and oriented to person, place, and time.     Comments: Unstable gait, assisted with a walker.  Psychiatric:     Comments: Well groomed, good eye contact.   ASSESSMENT AND PLAN:  [name] was seen today for hospitalization follow-up.  Diagnoses and all orders for this visit: Orders Placed This Encounter  Procedures  . CBC  . Basic metabolic panel   Lab Results  Component Value Date   CREATININE 1.23 11/29/2020   BUN 14 11/29/2020   NA 138 11/29/2020   K 4.8 11/29/2020   CL 101 11/29/2020   CO2 28 11/29/2020   Lab Results  Component Value Date   WBC 9.1 11/29/2020   HGB 11.3 (L) 11/29/2020   HCT 34.3 (L) 11/29/2020   MCV 89.0 11/29/2020   PLT 641.0 (H) 11/29/2020   Community acquired pneumonia of left lower lobe of lung Clinically improved. Complete abx treatment. Instructed about warning signs.  Type 2 diabetes mellitus with diabetic neuropathy, without long-term current use of insulin (HCC) HgA1C at goal. Continue Metformin 250 mg daily, which we could consider stopping next visit if problem is still well controlled. Regular exercise as tolerated and healthy diet with avoidance of added  sugar food intake encouraged. Appropriate foot care recommended.  Hypokalemia Continue KLOR 20 meq daily. Some side effects of Chlorthalidone discussed. Further recommendations according to BMP result.  Essential hypertension BP adequately controlled. Continue Atenolol-Chlorthalidone 20-25 mg daily. Low salt diet. Monitor BP at home.  Loculated pleural  effusion S/P thoracic tube placement. Wound is healing well.Cleaned with alcohol and bandage changed. Instructed to keep area clean with soap and water. He has appt for chest CT on 12/20/20 and will see Dr Silas Flood on 12/28/20. Instructed about warning signs.  Nausea without vomiting We discussed some side effects of abx. Recommend taking meds with food. Zofran 4 mg daily prn recommended. Instructed about warning signs.  Spent 45 minutes.  During this time history was obtained and documented, examination was performed, prior labs/imaging reviewed, and assessment/plan discussed.  Return in about 2 months (around 01/27/2021) for cpe.   [name]. [name], MD  [facility]. [place] office.  A few things to remember from today's visit:   Type 2 diabetes mellitus with diabetic neuropathy, without long-term current use of insulin (HCC)  Hypokalemia - Plan: Basic metabolic panel  Essential hypertension - Plan: CBC  Loculated pleural effusion  If you need refills please call your pharmacy. Do not use My Chart to request refills or for acute issues that need immediate attention.   Continue increasing physical activity as tolerated. Incentive spirometry a few times per day. You have appt for chest CT and to see surgeon in March.  No changes in your current medications.  Please be sure medication list is accurate. If a new problem present, please set up appointment sooner than planned today.

## 2020-12-01 DIAGNOSIS — E119 Type 2 diabetes mellitus without complications: Secondary | ICD-10-CM | POA: Diagnosis not present

## 2020-12-01 DIAGNOSIS — J90 Pleural effusion, not elsewhere classified: Secondary | ICD-10-CM | POA: Diagnosis not present

## 2020-12-01 DIAGNOSIS — I10 Essential (primary) hypertension: Secondary | ICD-10-CM | POA: Diagnosis not present

## 2020-12-01 DIAGNOSIS — R261 Paralytic gait: Secondary | ICD-10-CM | POA: Diagnosis not present

## 2020-12-01 DIAGNOSIS — E785 Hyperlipidemia, unspecified: Secondary | ICD-10-CM | POA: Diagnosis not present

## 2020-12-01 DIAGNOSIS — Z9181 History of falling: Secondary | ICD-10-CM | POA: Diagnosis not present

## 2020-12-01 DIAGNOSIS — Z7984 Long term (current) use of oral hypoglycemic drugs: Secondary | ICD-10-CM | POA: Diagnosis not present

## 2020-12-01 DIAGNOSIS — Z86711 Personal history of pulmonary embolism: Secondary | ICD-10-CM | POA: Diagnosis not present

## 2020-12-01 MED ORDER — ONDANSETRON HCL 4 MG PO TABS: 4.0000 mg | ORAL_TABLET | Freq: Every day | ORAL | 0 refills | Status: AC | PRN

## 2020-12-02 ENCOUNTER — Inpatient Hospital Stay: Payer: Medicare Other | Admitting: Family Medicine

## 2020-12-05 DIAGNOSIS — Z9181 History of falling: Secondary | ICD-10-CM | POA: Diagnosis not present

## 2020-12-05 DIAGNOSIS — Z7984 Long term (current) use of oral hypoglycemic drugs: Secondary | ICD-10-CM | POA: Diagnosis not present

## 2020-12-05 DIAGNOSIS — R261 Paralytic gait: Secondary | ICD-10-CM | POA: Diagnosis not present

## 2020-12-05 DIAGNOSIS — Z86711 Personal history of pulmonary embolism: Secondary | ICD-10-CM | POA: Diagnosis not present

## 2020-12-05 DIAGNOSIS — J90 Pleural effusion, not elsewhere classified: Secondary | ICD-10-CM | POA: Diagnosis not present

## 2020-12-05 DIAGNOSIS — E119 Type 2 diabetes mellitus without complications: Secondary | ICD-10-CM | POA: Diagnosis not present

## 2020-12-05 DIAGNOSIS — I10 Essential (primary) hypertension: Secondary | ICD-10-CM | POA: Diagnosis not present

## 2020-12-05 DIAGNOSIS — E785 Hyperlipidemia, unspecified: Secondary | ICD-10-CM | POA: Diagnosis not present

## 2020-12-07 DIAGNOSIS — J90 Pleural effusion, not elsewhere classified: Secondary | ICD-10-CM | POA: Diagnosis not present

## 2020-12-07 DIAGNOSIS — I10 Essential (primary) hypertension: Secondary | ICD-10-CM | POA: Diagnosis not present

## 2020-12-07 DIAGNOSIS — Z7984 Long term (current) use of oral hypoglycemic drugs: Secondary | ICD-10-CM | POA: Diagnosis not present

## 2020-12-07 DIAGNOSIS — R261 Paralytic gait: Secondary | ICD-10-CM | POA: Diagnosis not present

## 2020-12-07 DIAGNOSIS — Z9181 History of falling: Secondary | ICD-10-CM | POA: Diagnosis not present

## 2020-12-07 DIAGNOSIS — Z86711 Personal history of pulmonary embolism: Secondary | ICD-10-CM | POA: Diagnosis not present

## 2020-12-07 DIAGNOSIS — E785 Hyperlipidemia, unspecified: Secondary | ICD-10-CM | POA: Diagnosis not present

## 2020-12-07 DIAGNOSIS — E119 Type 2 diabetes mellitus without complications: Secondary | ICD-10-CM | POA: Diagnosis not present

## 2020-12-09 DIAGNOSIS — M47816 Spondylosis without myelopathy or radiculopathy, lumbar region: Secondary | ICD-10-CM | POA: Diagnosis not present

## 2020-12-09 DIAGNOSIS — I10 Essential (primary) hypertension: Secondary | ICD-10-CM | POA: Diagnosis not present

## 2020-12-12 DIAGNOSIS — E785 Hyperlipidemia, unspecified: Secondary | ICD-10-CM | POA: Diagnosis not present

## 2020-12-12 DIAGNOSIS — J90 Pleural effusion, not elsewhere classified: Secondary | ICD-10-CM | POA: Diagnosis not present

## 2020-12-12 DIAGNOSIS — Z9181 History of falling: Secondary | ICD-10-CM | POA: Diagnosis not present

## 2020-12-12 DIAGNOSIS — R261 Paralytic gait: Secondary | ICD-10-CM | POA: Diagnosis not present

## 2020-12-12 DIAGNOSIS — Z86711 Personal history of pulmonary embolism: Secondary | ICD-10-CM | POA: Diagnosis not present

## 2020-12-12 DIAGNOSIS — I2699 Other pulmonary embolism without acute cor pulmonale: Secondary | ICD-10-CM | POA: Diagnosis not present

## 2020-12-12 DIAGNOSIS — R5381 Other malaise: Secondary | ICD-10-CM | POA: Diagnosis not present

## 2020-12-12 DIAGNOSIS — Z7984 Long term (current) use of oral hypoglycemic drugs: Secondary | ICD-10-CM | POA: Diagnosis not present

## 2020-12-12 DIAGNOSIS — E119 Type 2 diabetes mellitus without complications: Secondary | ICD-10-CM | POA: Diagnosis not present

## 2020-12-12 DIAGNOSIS — S82851A Displaced trimalleolar fracture of right lower leg, initial encounter for closed fracture: Secondary | ICD-10-CM | POA: Diagnosis not present

## 2020-12-12 DIAGNOSIS — I10 Essential (primary) hypertension: Secondary | ICD-10-CM | POA: Diagnosis not present

## 2020-12-14 DIAGNOSIS — J90 Pleural effusion, not elsewhere classified: Secondary | ICD-10-CM | POA: Diagnosis not present

## 2020-12-14 DIAGNOSIS — Z7984 Long term (current) use of oral hypoglycemic drugs: Secondary | ICD-10-CM | POA: Diagnosis not present

## 2020-12-14 DIAGNOSIS — I10 Essential (primary) hypertension: Secondary | ICD-10-CM | POA: Diagnosis not present

## 2020-12-14 DIAGNOSIS — E785 Hyperlipidemia, unspecified: Secondary | ICD-10-CM | POA: Diagnosis not present

## 2020-12-14 DIAGNOSIS — E119 Type 2 diabetes mellitus without complications: Secondary | ICD-10-CM | POA: Diagnosis not present

## 2020-12-14 DIAGNOSIS — Z86711 Personal history of pulmonary embolism: Secondary | ICD-10-CM | POA: Diagnosis not present

## 2020-12-14 DIAGNOSIS — R261 Paralytic gait: Secondary | ICD-10-CM | POA: Diagnosis not present

## 2020-12-14 DIAGNOSIS — Z9181 History of falling: Secondary | ICD-10-CM | POA: Diagnosis not present

## 2020-12-20 ENCOUNTER — Ambulatory Visit (INDEPENDENT_AMBULATORY_CARE_PROVIDER_SITE_OTHER)
Admission: RE | Admit: 2020-12-20 | Discharge: 2020-12-20 | Disposition: A | Discharge status: Home / Self Care | Payer: Medicare Other | Source: Ambulatory Visit | Attending: Critical Care Medicine | Admitting: Critical Care Medicine

## 2020-12-20 ENCOUNTER — Other Ambulatory Visit: Payer: Self-pay

## 2020-12-20 ENCOUNTER — Telehealth: Payer: Self-pay | Admitting: Critical Care Medicine

## 2020-12-20 DIAGNOSIS — J90 Pleural effusion, not elsewhere classified: Secondary | ICD-10-CM

## 2020-12-20 DIAGNOSIS — I2510 Atherosclerotic heart disease of native coronary artery without angina pectoris: Secondary | ICD-10-CM | POA: Diagnosis not present

## 2020-12-20 DIAGNOSIS — R918 Other nonspecific abnormal finding of lung field: Secondary | ICD-10-CM | POA: Diagnosis not present

## 2020-12-20 NOTE — Telephone Encounter (Signed)
Reviewed CT scan- improving pleural abnormalities with the prolonged antibiotics course. I notified Mr. Bogan that his CT looks improved and Dr. Silas Flood will see him in he office in the coming days as previously scheduled.  Julian Hy, DO 12/20/20 3:15 PM [facility]

## 2020-12-21 ENCOUNTER — Other Ambulatory Visit: Payer: Self-pay | Admitting: Family Medicine

## 2020-12-21 DIAGNOSIS — E119 Type 2 diabetes mellitus without complications: Secondary | ICD-10-CM | POA: Diagnosis not present

## 2020-12-21 DIAGNOSIS — R261 Paralytic gait: Secondary | ICD-10-CM | POA: Diagnosis not present

## 2020-12-21 DIAGNOSIS — I10 Essential (primary) hypertension: Secondary | ICD-10-CM

## 2020-12-21 DIAGNOSIS — E785 Hyperlipidemia, unspecified: Secondary | ICD-10-CM | POA: Diagnosis not present

## 2020-12-21 DIAGNOSIS — Z7984 Long term (current) use of oral hypoglycemic drugs: Secondary | ICD-10-CM | POA: Diagnosis not present

## 2020-12-21 DIAGNOSIS — J90 Pleural effusion, not elsewhere classified: Secondary | ICD-10-CM | POA: Diagnosis not present

## 2020-12-21 DIAGNOSIS — Z86711 Personal history of pulmonary embolism: Secondary | ICD-10-CM | POA: Diagnosis not present

## 2020-12-21 DIAGNOSIS — Z9181 History of falling: Secondary | ICD-10-CM | POA: Diagnosis not present

## 2020-12-23 DIAGNOSIS — I10 Essential (primary) hypertension: Secondary | ICD-10-CM | POA: Diagnosis not present

## 2020-12-23 DIAGNOSIS — Z7984 Long term (current) use of oral hypoglycemic drugs: Secondary | ICD-10-CM | POA: Diagnosis not present

## 2020-12-23 DIAGNOSIS — J90 Pleural effusion, not elsewhere classified: Secondary | ICD-10-CM | POA: Diagnosis not present

## 2020-12-23 DIAGNOSIS — Z86711 Personal history of pulmonary embolism: Secondary | ICD-10-CM | POA: Diagnosis not present

## 2020-12-23 DIAGNOSIS — R261 Paralytic gait: Secondary | ICD-10-CM | POA: Diagnosis not present

## 2020-12-23 DIAGNOSIS — Z9181 History of falling: Secondary | ICD-10-CM | POA: Diagnosis not present

## 2020-12-23 DIAGNOSIS — E785 Hyperlipidemia, unspecified: Secondary | ICD-10-CM | POA: Diagnosis not present

## 2020-12-23 DIAGNOSIS — E119 Type 2 diabetes mellitus without complications: Secondary | ICD-10-CM | POA: Diagnosis not present

## 2020-12-26 DIAGNOSIS — Z7984 Long term (current) use of oral hypoglycemic drugs: Secondary | ICD-10-CM | POA: Diagnosis not present

## 2020-12-26 DIAGNOSIS — E785 Hyperlipidemia, unspecified: Secondary | ICD-10-CM | POA: Diagnosis not present

## 2020-12-26 DIAGNOSIS — I10 Essential (primary) hypertension: Secondary | ICD-10-CM | POA: Diagnosis not present

## 2020-12-26 DIAGNOSIS — J90 Pleural effusion, not elsewhere classified: Secondary | ICD-10-CM | POA: Diagnosis not present

## 2020-12-26 DIAGNOSIS — Z9181 History of falling: Secondary | ICD-10-CM | POA: Diagnosis not present

## 2020-12-26 DIAGNOSIS — E119 Type 2 diabetes mellitus without complications: Secondary | ICD-10-CM | POA: Diagnosis not present

## 2020-12-26 DIAGNOSIS — Z86711 Personal history of pulmonary embolism: Secondary | ICD-10-CM | POA: Diagnosis not present

## 2020-12-26 DIAGNOSIS — R261 Paralytic gait: Secondary | ICD-10-CM | POA: Diagnosis not present

## 2020-12-28 ENCOUNTER — Encounter: Payer: Self-pay | Admitting: Pulmonary Disease

## 2020-12-28 ENCOUNTER — Ambulatory Visit: Payer: Medicare Other | Admitting: Pulmonary Disease

## 2020-12-28 ENCOUNTER — Other Ambulatory Visit: Payer: Self-pay

## 2020-12-28 VITALS — BP 120/88 | HR 67 | Temp 97.9°F | Ht 72.0 in | Wt 250.8 lb

## 2020-12-28 DIAGNOSIS — J189 Pneumonia, unspecified organism: Secondary | ICD-10-CM

## 2020-12-28 DIAGNOSIS — J90 Pleural effusion, not elsewhere classified: Secondary | ICD-10-CM

## 2020-12-28 NOTE — Patient Instructions (Addendum)
Nice to meet you  Your repeat CT scan recently showed marked improvement in the fluid around the lung.  This is encouraging.  I am not going to schedule a follow-up visit.  Please contact our office if we can help in any way, or if new symptoms develop in the future.  Our office number is [phone].   Return to clinic as needed

## 2020-12-28 NOTE — Progress Notes (Signed)
@Patient  ID: Keith [name], male    DOB: 11-18-1950, 70 y.o.   MRN: [id number]  Chief Complaint  Patient presents with  . Consult    F/u post hospital visit - pneumonia to left lung in February. No current respiratory symptoms.     Referring provider: [name], [name], MD  HPI:   70 year old whom we are seen in hospital follow-up after loculated pleural effusion presumably parapneumonic.  Discharge summary reviewed.  H&P reviewed.  Multiple pulmonary consult notes reviewed.  Patient states he had no real symptoms prior to being hospitalized.  Notes indicate he had 3-day history of low fever, cough.  He says that he was not having any symptoms but then slipped when come out the car.  Fell on his side.  This prompted ED visit.  There CT scanning revealed loculated pleural effusion with a large component in the fissure on the left my interpretation.  This prompted IR CT-guided chest tube placement with collection of a pleural fluid culture which was negative.  No other fluid studies available to review, personally not sent.  He received 3 doses of intrapleural lytics.  Mild improvement on repeat CT chest on my interpretation was slightly decreased size of effusion particularly the large area in the fissure.  He was discharged complete 4 weeks of oral Augmentin.  He had a CT scan 12/20/2020 for follow-up of this which showed interval decrease in pleural effusion with likely development of pleural thickening versus fibrothorax on my interpretation.  Today, he denies any respiratory symptoms.  No dyspnea on exertion.  No cough.  Is currently doing rehab given his extent hospital stay to get his strength back.  He feels like this is largest limiting factor is lack of stamina.  PMH: Hypertension, PE Surgical history: Neck surgery 07/2018 Family history: No respiratory issues in first relatives, history of cancer Social history: Former Manufacturing engineer, works in Engineer, production, Investment banker, corporate, former  smoker quit in 1994, 20+ pack year smoking history   Questionaires / Pulmonary Flowsheets:   ACT:  No flowsheet data found.  MMRC: No flowsheet data found.  Epworth:  No flowsheet data found.  Tests:   FENO:  No results found for: NITRICOXIDE  PFT: No flowsheet data found.  WALK:  No flowsheet data found.  Imaging:  Personally reviewed and as per EMR discussion this note CT Chest Wo Contrast  Result Date: 12/20/2020 CLINICAL DATA:  Pneumonia, pleural effusion, follow-up EXAM: CT CHEST WITHOUT CONTRAST TECHNIQUE: Multidetector CT imaging of the chest was performed following the standard protocol without IV contrast. COMPARISON:  11/22/2020 FINDINGS: Cardiovascular: Normal heart size. No pericardial effusion. Coronary artery calcification. Thoracic aorta is normal caliber with minimal calcified plaque. Mediastinum/Nodes: No enlarged lymph nodes. Thyroid and esophagus are unremarkable. Lungs/Pleura: Decreased left pleural effusion with small residual at the lung base. Small volume residual fluid within the oblique fissure. Improved lung aeration with persistent underlying atelectasis or developing scarring. No pneumothorax. Stable 5 mm left upper lobe nodule (series 3, image 65). Stable 3 mm right middle lobe nodule (image 77). Upper Abdomen: Left adrenal adenoma is again identified. No acute abnormality. Musculoskeletal: Degenerative changes of the included spine similar to prior study. No acute osseous abnormality IMPRESSION: Improvement in left pleural effusion with residual underlying atelectasis or developing scarring. Stable left upper lobe and right middle lobe nodules. Electronically Signed   By: Macy Mis M.D.   On: 12/20/2020 14:16    Lab Results: Personally reviewed CBC    Component Value Date/Time  WBC 9.1 11/29/2020 1158   RBC 3.85 (L) 11/29/2020 1158   HGB 11.3 (L) 11/29/2020 1158   HCT 34.3 (L) 11/29/2020 1158   PLT 641.0 (H) 11/29/2020 1158   MCV 89.0  11/29/2020 1158   MCH 29.0 11/23/2020 0155   MCHC 33.0 11/29/2020 1158   RDW 15.7 (H) 11/29/2020 1158   LYMPHSABS 0.7 11/20/2020 0122   MONOABS 2.4 (H) 11/20/2020 0122   EOSABS 0.0 11/20/2020 0122   BASOSABS 0.0 11/20/2020 0122    BMET    Component Value Date/Time   NA 138 11/29/2020 1158   K 4.8 11/29/2020 1158   CL 101 11/29/2020 1158   CO2 28 11/29/2020 1158   GLUCOSE 113 (H) 11/29/2020 1158   GLUCOSE 100 (H) 09/09/2006 0956   BUN 14 11/29/2020 1158   CREATININE 1.23 11/29/2020 1158   CALCIUM 10.5 11/29/2020 1158   GFRNONAA >60 11/24/2020 0107   GFRAA >60 07/11/2020 0619    BNP    Component Value Date/Time   BNP 92.3 06/26/2020 2001    ProBNP No results found for: PROBNP  Specialty Problems      Pulmonary Problems   Allergic rhinitis due to pollen   Allergic rhinitis   Acute respiratory failure with hypoxia (HCC)   CAP (community acquired pneumonia)   Loculated pleural effusion      Allergies  Allergen Reactions  . Aspirin Other (See Comments)    Irritates the stomach and the patient developed ulcers, also  . Lisinopril Other (See Comments)    Caused a body ache    Immunization History  Administered Date(s) Administered  . PFIZER(Purple Top)SARS-COV-2 Vaccination 09/24/2020  . Pneumococcal Conjugate-13 11/11/2017, 11/24/2020  . Pneumococcal Polysaccharide-23 11/09/2014, 03/30/2020  . Td 10/09/2003  . Tdap 11/09/2013    Past Medical History:  Diagnosis Date  . Allergy   . Chronic back pain   . Diabetes mellitus without complication ([place])   . DNR (do not resuscitate) 11/19/2020  . Duodenal ulcer hemorrhage 08/29/2014  . ED (erectile dysfunction)   . Esophageal stricture 08/30/2014  . Glucose intolerance (impaired glucose tolerance)   . Hiatal hernia 08/30/2014  . Hypertension   . Hypokalemia 04/11/2013  . 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 Obesity   . Osteoarthritis   . Pneumonia   . Pulmonary embolism ([place]) 06/2020  . Scoliosis 2016  . Spinal  stenosis of lumbar region   . Urinary tract infection 04/11/2013    Tobacco History: Social History   Tobacco Use  Smoking Status Former Smoker  . Packs/day: 1.00  . Years: 25.00  . Pack years: 25.00  . Quit date: 57  . Years since quitting: 27.2  Smokeless Tobacco Never Used   Counseling given: Not Answered   Continue to not smoke  Outpatient Encounter Medications as of 12/28/2020  Medication Sig  . aluminum-magnesium hydroxide-simethicone (MAALOX) 200-200-20 MG/5ML SUSP Take 15-30 mLs by mouth 3 (three) times daily as needed (for heartburn or indigestion).  Marland Kitchen atenolol-chlorthalidone (TENORETIC) 50-25 MG tablet Take 1 tablet by mouth daily.  . cloNIDine (CATAPRES) 0.1 MG tablet TAKE 1 TABLET (0.1 MG TOTAL) BY MOUTH DAILY.  Marland Kitchen METAMUCIL FIBER PO Take by mouth See admin instructions. Mix 1 teaspoonful of powder into 6-8 ounces of water and drink once a day as needed for mild constipation  . metFORMIN (GLUCOPHAGE) 500 MG tablet Take 0.5 tablets (250 mg total) by mouth daily with breakfast.  . Multiple Vitamin (MULITIVITAMIN WITH MINERALS) TABS Take 1 tablet by mouth daily with breakfast.  .  oxyCODONE-acetaminophen (PERCOCET) 7.5-325 MG tablet Take 2 tablets by mouth See admin instructions. Take 2 (TWO) tablets by mouth in the morning at 7 AM and in the evening at 6 PM  . polyethylene glycol (MIRALAX / GLYCOLAX) 17 g packet Take 17 g by mouth 2 (two) times daily.  . potassium chloride SA (KLOR-CON) 20 MEQ tablet Take 1 tablet (20 mEq total) by mouth daily.  . simvastatin (ZOCOR) 40 MG tablet TAKE 1 TABLET BY MOUTH EVERY DAY  . DULoxetine (CYMBALTA) 30 MG capsule Take 2 capsules (60 mg total) by mouth daily. (Patient not taking: Reported on 12/28/2020)  . [DISCONTINUED] senna-docusate (SENOKOT-S) 8.6-50 MG tablet Take 2 tablets by mouth 2 (two) times daily. (Patient not taking: Reported on 12/28/2020)   No facility-administered encounter medications on file as of 12/28/2020.      Review of Systems  Review of Systems  No orthopnea or PND.  No chest pain with exertion.  Comprehensive review of systems otherwise negative.  Physical Exam  BP 120/88 (BP Location: Left Arm, Cuff Size: Normal)   Pulse 67   Temp 97.9 F (36.6 C) (Temporal)   Ht 6' (1.829 m)   Wt 250 lb 12.8 oz (113.8 kg)   SpO2 100%   BMI 34.01 kg/m   Wt Readings from Last 5 Encounters:  12/28/20 250 lb 12.8 oz (113.8 kg)  11/19/20 270 lb (122.5 kg)  07/04/20 264 lb 12.4 oz (120.1 kg)  07/03/20 271 lb 13.2 oz (123.3 kg)  06/04/20 257 lb (116.6 kg)    BMI Readings from Last 5 Encounters:  12/28/20 34.01 kg/m  11/29/20 36.62 kg/m  11/19/20 36.62 kg/m  08/01/20 35.91 kg/m  07/04/20 35.91 kg/m     Physical Exam General: Well-appearing, no distress Eyes: EOMI, no icterus Neck: Supple no JVP Cardiovascular: Regular in rhythm, no murmurs Pulmonary: Clear to oscillation bilaterally, diminished left base, normal work of breathing Abdomen: Nondistended, bowel sounds present MSK: No synovitis, joint effusion Neuro: Antalgic gait with walker, left in wheelchair, sensation intact Psych: Normal mood, full affect  Assessment & Plan:   Loculated left pleural effusion: Presumed parapneumonic.  Status post chest tube and intrapleural lytics.  Unfortunately, tube placed in IR no pleural studies were sent with the exception of a culture which was negative.  No description of fluid, possible old hemothorax although this is usually noted in the op note.  Repeat CT scan last week showed improvement but persistent thickening or small effusion.  Suspect some element of fibrothorax.  Denies respiratory issues at this time.  No further work-up.  History of Tobacco abuse: 20+ pack year smoking issue.  Quit about 30 years ago.  At risk for obstructive disease.  Asked patient to please contact us if we can help in any way in the future, he was congratulated on his abstinence and encouraged to continue  to abstain from smoking which he has all intention to do so.  Return if symptoms worsen or fail to improve.   Lanier Clam, MD 12/28/2020

## 2020-12-29 DIAGNOSIS — J90 Pleural effusion, not elsewhere classified: Secondary | ICD-10-CM | POA: Diagnosis not present

## 2020-12-29 DIAGNOSIS — Z9181 History of falling: Secondary | ICD-10-CM | POA: Diagnosis not present

## 2020-12-29 DIAGNOSIS — E119 Type 2 diabetes mellitus without complications: Secondary | ICD-10-CM | POA: Diagnosis not present

## 2020-12-29 DIAGNOSIS — Z86711 Personal history of pulmonary embolism: Secondary | ICD-10-CM | POA: Diagnosis not present

## 2020-12-29 DIAGNOSIS — R261 Paralytic gait: Secondary | ICD-10-CM | POA: Diagnosis not present

## 2020-12-29 DIAGNOSIS — Z7984 Long term (current) use of oral hypoglycemic drugs: Secondary | ICD-10-CM | POA: Diagnosis not present

## 2020-12-29 DIAGNOSIS — I10 Essential (primary) hypertension: Secondary | ICD-10-CM | POA: Diagnosis not present

## 2020-12-29 DIAGNOSIS — E785 Hyperlipidemia, unspecified: Secondary | ICD-10-CM | POA: Diagnosis not present

## 2021-01-03 ENCOUNTER — Ambulatory Visit (INDEPENDENT_AMBULATORY_CARE_PROVIDER_SITE_OTHER): Payer: Medicare Other

## 2021-01-03 ENCOUNTER — Other Ambulatory Visit: Payer: Self-pay

## 2021-01-03 VITALS — BP 136/78 | HR 82 | Temp 98.2°F | Resp 20 | Wt 254.1 lb

## 2021-01-03 DIAGNOSIS — Z0000 Encounter for general adult medical examination without abnormal findings: Secondary | ICD-10-CM | POA: Diagnosis not present

## 2021-01-03 NOTE — Patient Instructions (Addendum)
Keith [name] , Thank you for taking time to come for your Medicare Wellness Visit. I appreciate your ongoing commitment to your health goals. Please review the following plan we discussed and let me know if I can assist you in the future.   Screening recommendations/referrals: Colonoscopy: Done 01/20/18 Recommended yearly visit for glaucoma screening and checkup Recommended yearly dental visit for hygiene and checkup  Vaccinations: Influenza vaccine: Due and discussed Pneumococcal vaccine: Up to date Tdap vaccine: Up to date Shingles vaccine: Shingrix discussed. Please contact your pharmacy for coverage information.    Covid-19: Booster 09/24/20  Advanced directives: Please bring a copy of your health care power of attorney and living will to the office at your convenience.  Conditions/risks identified: Get off the walker   Next appointment: Follow up in one year for your annual wellness visit.   Preventive Care 7 Years and Older, Male Preventive care refers to lifestyle choices and visits with your health care provider that can promote health and wellness. What does preventive care include?  A yearly physical exam. This is also called an annual well check.  Dental exams once or twice a year.  Routine eye exams. Ask your health care provider how often you should have your eyes checked.  Personal lifestyle choices, including:  Daily care of your teeth and gums.  Regular physical activity.  Eating a healthy diet.  Avoiding tobacco and drug use.  Limiting alcohol use.  Practicing safe sex.  Taking low doses of aspirin every day.  Taking vitamin and mineral supplements as recommended by your health care provider. What happens during an annual well check? The services and screenings done by your health care provider during your annual well check will depend on your age, overall health, lifestyle risk factors, and family history of disease. Counseling  Your health care  provider may ask you questions about your:  Alcohol use.  Tobacco use.  Drug use.  Emotional well-being.  Home and relationship well-being.  Sexual activity.  Eating habits.  History of falls.  Memory and ability to understand (cognition).  Work and work Statistician. Screening  You may have the following tests or measurements:  Height, weight, and BMI.  Blood pressure.  Lipid and cholesterol levels. These may be checked every 5 years, or more frequently if you are over 8 years old.  Skin check.  Lung cancer screening. You may have this screening every year starting at age 37 if you have a 30-pack-year history of smoking and currently smoke or have quit within the past 15 years.  Fecal occult blood test (FOBT) of the stool. You may have this test every year starting at age 71.  Flexible sigmoidoscopy or colonoscopy. You may have a sigmoidoscopy every 5 years or a colonoscopy every 10 years starting at age 76.  Prostate cancer screening. Recommendations will vary depending on your family history and other risks.  Hepatitis C blood test.  Hepatitis B blood test.  Sexually transmitted disease (STD) testing.  Diabetes screening. This is done by checking your blood sugar (glucose) after you have not eaten for a while (fasting). You may have this done every 1-3 years.  Abdominal aortic aneurysm (AAA) screening. You may need this if you are a current or former smoker.  Osteoporosis. You may be screened starting at age 31 if you are at high risk. Talk with your health care provider about your test results, treatment options, and if necessary, the need for more tests. Vaccines  Your health care  provider may recommend certain vaccines, such as:  Influenza vaccine. This is recommended every year.  Tetanus, diphtheria, and acellular pertussis (Tdap, Td) vaccine. You may need a Td booster every 10 years.  Zoster vaccine. You may need this after age 72.  Pneumococcal  13-valent conjugate (PCV13) vaccine. One dose is recommended after age 76.  Pneumococcal polysaccharide (PPSV23) vaccine. One dose is recommended after age 73. Talk to your health care provider about which screenings and vaccines you need and how often you need them. This information is not intended to replace advice given to you by your health care provider. Make sure you discuss any questions you have with your health care provider. Document Released: 10/21/2015 Document Revised: 06/13/2016 Document Reviewed: 07/26/2015 Elsevier Interactive Patient Education  2017 [place] Prevention in the Home Falls can cause injuries. They can happen to people of all ages. There are many things you can do to make your home safe and to help prevent falls. What can I do on the outside of my home?  Regularly fix the edges of walkways and driveways and fix any cracks.  Remove anything that might make you trip as you walk through a door, such as a raised step or threshold.  Trim any bushes or trees on the path to your home.  Use bright outdoor lighting.  Clear any walking paths of anything that might make someone trip, such as rocks or tools.  Regularly check to see if handrails are loose or broken. Make sure that both sides of any steps have handrails.  Any raised decks and porches should have guardrails on the edges.  Have any leaves, snow, or ice cleared regularly.  Use sand or salt on walking paths during winter.  Clean up any spills in your garage right away. This includes oil or grease spills. What can I do in the bathroom?  Use night lights.  Install grab bars by the toilet and in the tub and shower. Do not use towel bars as grab bars.  Use non-skid mats or decals in the tub or shower.  If you need to sit down in the shower, use a plastic, non-slip stool.  Keep the floor dry. Clean up any water that spills on the floor as soon as it happens.  Remove soap buildup in the  tub or shower regularly.  Attach bath mats securely with double-sided non-slip rug tape.  Do not have throw rugs and other things on the floor that can make you trip. What can I do in the bedroom?  Use night lights.  Make sure that you have a light by your bed that is easy to reach.  Do not use any sheets or blankets that are too big for your bed. They should not hang down onto the floor.  Have a firm chair that has side arms. You can use this for support while you get dressed.  Do not have throw rugs and other things on the floor that can make you trip. What can I do in the kitchen?  Clean up any spills right away.  Avoid walking on wet floors.  Keep items that you use a lot in easy-to-reach places.  If you need to reach something above you, use a strong step stool that has a grab bar.  Keep electrical cords out of the way.  Do not use floor polish or wax that makes floors slippery. If you must use wax, use non-skid floor wax.  Do not have throw  rugs and other things on the floor that can make you trip. What can I do with my stairs?  Do not leave any items on the stairs.  Make sure that there are handrails on both sides of the stairs and use them. Fix handrails that are broken or loose. Make sure that handrails are as long as the stairways.  Check any carpeting to make sure that it is firmly attached to the stairs. Fix any carpet that is loose or worn.  Avoid having throw rugs at the top or bottom of the stairs. If you do have throw rugs, attach them to the floor with carpet tape.  Make sure that you have a light switch at the top of the stairs and the bottom of the stairs. If you do not have them, ask someone to add them for you. What else can I do to help prevent falls?  Wear shoes that:  Do not have high heels.  Have rubber bottoms.  Are comfortable and fit you well.  Are closed at the toe. Do not wear sandals.  If you use a stepladder:  Make sure that it  is fully opened. Do not climb a closed stepladder.  Make sure that both sides of the stepladder are locked into place.  Ask someone to hold it for you, if possible.  Clearly mark and make sure that you can see:  Any grab bars or handrails.  First and last steps.  Where the edge of each step is.  Use tools that help you move around (mobility aids) if they are needed. These include:  Canes.  Walkers.  Scooters.  Crutches.  Turn on the lights when you go into a dark area. Replace any light bulbs as soon as they burn out.  Set up your furniture so you have a clear path. Avoid moving your furniture around.  If any of your floors are uneven, fix them.  If there are any pets around you, be aware of where they are.  Review your medicines with your doctor. Some medicines can make you feel dizzy. This can increase your chance of falling. Ask your doctor what other things that you can do to help prevent falls. This information is not intended to replace advice given to you by your health care provider. Make sure you discuss any questions you have with your health care provider. Document Released: 07/21/2009 Document Revised: 03/01/2016 Document Reviewed: 10/29/2014 Elsevier Interactive Patient Education  2017 [employer].

## 2021-01-03 NOTE — Progress Notes (Addendum)
Subjective:   Keith [name] is a 70 y.o. male who presents for an Initial Medicare Annual Wellness Visit along with Wife Altha Harm   Review of Systems     Cardiac Risk Factors include: advanced age (>66men, >62 women);diabetes mellitus;dyslipidemia;male gender;hypertension;obesity (BMI >30kg/m2)     Objective:    Today's Vitals   01/03/21 1008 01/03/21 1104  BP:  136/78  Pulse:  82  Resp:  20  Temp:  98.2 F (36.8 C)  SpO2:  98%  Weight:  254 lb 1.6 oz (115.3 kg)  PainSc: 4     Body mass index is 34.46 kg/m.  Advanced Directives 01/03/2021 11/21/2020 11/19/2020 07/04/2020 07/01/2020 06/26/2020 05/31/2020  Does Patient Have a Medical Advance Directive? Yes Yes Yes Yes Yes Yes No  Type of Paramedic of [place];Living will - Living will - Living will;Healthcare Power of Attorney -  Does patient want to make changes to medical advance directive? - No - Patient declined - No - Patient declined - No - Patient declined -  Copy of [place] in Chart? No - copy requested No - copy requested - No - copy requested No - copy requested No - copy requested -  Would patient like information on creating a medical advance directive? - - - - - - No - Patient declined  Pre-existing out of facility DNR order (yellow form or pink MOST form) - - - - - - -    Current Medications (verified) Outpatient Encounter Medications as of 01/03/2021  Medication Sig  . aluminum-magnesium hydroxide-simethicone (MAALOX) 200-200-20 MG/5ML SUSP Take 15-30 mLs by mouth 3 (three) times daily as needed (for heartburn or indigestion).  Marland Kitchen atenolol-chlorthalidone (TENORETIC) 50-25 MG tablet Take 1 tablet by mouth daily.  . cloNIDine (CATAPRES) 0.1 MG tablet TAKE 1 TABLET (0.1 MG TOTAL) BY MOUTH DAILY.  . DULoxetine (CYMBALTA) 30 MG capsule Take 2 capsules (60 mg total) by mouth daily.  . metFORMIN (GLUCOPHAGE) 500 MG tablet Take 0.5 tablets (250 mg  total) by mouth daily with breakfast.  . Multiple Vitamin (MULITIVITAMIN WITH MINERALS) TABS Take 1 tablet by mouth daily with breakfast.  . oxyCODONE-acetaminophen (PERCOCET) 7.5-325 MG tablet Take 2 tablets by mouth See admin instructions. Take 2 (TWO) tablets by mouth in the morning at 7 AM and in the evening at 6 PM  . polyethylene glycol (MIRALAX / GLYCOLAX) 17 g packet Take 17 g by mouth 2 (two) times daily.  . potassium chloride SA (KLOR-CON) 20 MEQ tablet Take 1 tablet (20 mEq total) by mouth daily.  . simvastatin (ZOCOR) 40 MG tablet TAKE 1 TABLET BY MOUTH EVERY DAY  . [DISCONTINUED] METAMUCIL FIBER PO Take by mouth See admin instructions. Mix 1 teaspoonful of powder into 6-8 ounces of water and drink once a day as needed for mild constipation (Patient not taking: Reported on 01/03/2021)   No facility-administered encounter medications on file as of 01/03/2021.    Allergies (verified) Aspirin and Lisinopril   History: Past Medical History:  Diagnosis Date  . Allergy   . Chronic back pain   . Diabetes mellitus without complication ([place])   . DNR (do not resuscitate) 11/19/2020  . Duodenal ulcer hemorrhage 08/29/2014  . ED (erectile dysfunction)   . Esophageal stricture 08/30/2014  . Glucose intolerance (impaired glucose tolerance)   . Hiatal hernia 08/30/2014  . Hypertension   . Hypokalemia 04/11/2013  . 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 Obesity   . Osteoarthritis   .  Pneumonia   . Pulmonary embolism ([place]) 06/2020  . Scoliosis 2016  . Spinal stenosis of lumbar region   . Urinary tract infection 04/11/2013   Past Surgical History:  Procedure Laterality Date  . COLONOSCOPY  2008  . ESOPHAGOGASTRODUODENOSCOPY N/A 08/29/2014   Procedure: ESOPHAGOGASTRODUODENOSCOPY (EGD);  Surgeon: Lafayette Dragon, MD;  Location: [facility];  Service: Endoscopy;  Laterality: N/A;  . LAMINECTOMY N/A 07/28/2018   Procedure: THORACIC ELEVEN- THORACIC TWELVE POSTERIOR DECOMPRESSION LAMINECTOMY;   Surgeon: Judith Part, MD;  [facility];  Service: Neurosurgery;  Laterality: N/A;  . LUMBAR LAMINECTOMY    . NASAL POLYP SURGERY    . ORIF ANKLE FRACTURE Right 07/01/2020   Procedure: OPEN REDUCTION INTERNAL FIXATION (ORIF) ANKLE FRACTURE. TRIMALLEOLAR;  Surgeon: Marchia Bond, MD;  Location: [place];  Service: Orthopedics;  Laterality: Right;  . TONSILLECTOMY     Family History  Problem Relation Age of Onset  . Diabetes Mother   . Asthma Mother   . Hypertension Father   . Stroke Father   . Colon cancer Neg Hx    Social History   Socioeconomic History  . Marital status: Married    Spouse name: Not on file  . Number of children: Not on file  . Years of education: Not on file  . Highest education level: Not on file  Occupational History  . Occupation: retired  Tobacco Use  . Smoking status: Former Smoker    Packs/day: 1.00    Years: 25.00    Pack years: 25.00    Quit date: 1995    Years since quitting: 27.2  . Smokeless tobacco: Never Used  Vaping Use  . Vaping Use: Never used  Substance and Sexual Activity  . Alcohol use: Yes    Comment: holidays and special occasions  . Drug use: No  . Sexual activity: Yes    Birth control/protection: None  Other Topics Concern  . Not on file  Social History Narrative   Married.     Social Determinants of Health   Financial Resource Strain: Low Risk   . Difficulty of Paying Living Expenses: Not hard at all  Food Insecurity: No Food Insecurity  . Worried About Charity fundraiser in the Last Year: Never true  . Ran Out of Food in the Last Year: Never true  Transportation Needs: No Transportation Needs  . Lack of Transportation (Medical): No  . Lack of Transportation (Non-Medical): No  Physical Activity: Sufficiently Active  . Days of Exercise per Week: 5 days  . Minutes of Exercise per Session: 60 min  Stress: No Stress Concern Present  . Feeling of Stress : Not at all  Social Connections: Moderately Integrated   . Frequency of Communication with Friends and Family: Three times a week  . Frequency of Social Gatherings with Friends and Family: Three times a week  . Attends Religious Services: More than 4 times per year  . Active Member of Clubs or Organizations: No  . Attends Archivist Meetings: Never  . Marital Status: Married    Tobacco Counseling Counseling given: Not Answered   Clinical Intake:  Pre-visit preparation completed: Yes  Pain : 0-10 Pain Score: 4  Pain Type: Chronic pain Pain Location: Back Pain Orientation: Lower Pain Descriptors / Indicators: Throbbing,Tingling Pain Onset: More than a month ago Pain Frequency: Constant     BMI - recorded: 34.46 Nutritional Status: BMI > 30  Obese Nutritional Risks: None Diabetes: Yes CBG done?: No Did pt.  bring in CBG monitor from home?: No  How often do you need to have someone help you when you read instructions, pamphlets, or other written materials from your doctor or pharmacy?: 1 - Never  Diabetic?Nutrition Risk Assessment:  Has the patient had any N/V/D within the last 2 months?  No  Does the patient have any non-healing wounds?  No  Has the patient had any unintentional weight loss or weight gain?  No   Diabetes:  Is the patient diabetic?  Yes  If diabetic, was a CBG obtained today?  No  Did the patient bring in their glucometer from home?  No  How often do you monitor your CBG's? Monthly .   Financial Strains and Diabetes Management:  Are you having any financial strains with the device, your supplies or your medication? No .  Does the patient want to be seen by Chronic Care Management for management of their diabetes?  No  Would the patient like to be referred to a Nutritionist or for Diabetic Management?  No   Diabetic Exams:  Diabetic Eye Exam: Overdue for diabetic eye exam. Pt has been advised about the importance in completing this exam. Patient advised to call and schedule an eye  exam. Diabetic Foot Exam: Completed 03/30/20   Interpreter Needed?: No  Information entered by :: Charlott Rakes , LPN   Activities of Daily Living In your present state of health, do you have any difficulty performing the following activities: 01/03/2021 11/21/2020  Hearing? N -  Vision? N -  Difficulty concentrating or making decisions? N -  Walking or climbing stairs? Y -  Comment struggle attimes -  Dressing or bathing? N -  Doing errands, shopping? N Y  Conservation officer, nature and eating ? N -  Using the Toilet? N -  In the past six months, have you accidently leaked urine? Y -  Comment may be related to surgery -  Do you have problems with loss of bowel control? N -  Managing your Medications? N -  Managing your Finances? N -  Housekeeping or managing your Housekeeping? N -  Some recent data might be hidden    Patient Care Team: [name], [name], MD as PCP - General (Family Medicine)  Indicate any recent Medical Services you may have received from other than [facility] providers in the past year (date may be approximate).     Assessment:   This is a routine wellness examination for E[place].  Hearing/Vision screen  Hearing Screening   125Hz  250Hz  500Hz  1000Hz  2000Hz  3000Hz  4000Hz  6000Hz  8000Hz   Right ear:           Left ear:           Comments: Pt denies any hearing issues   Vision Screening Comments: Pt follows up with eye dr for annual exams   Dietary issues and exercise activities discussed: Current Exercise Habits: Structured exercise class, Time (Minutes): 45, Frequency (Times/Week): 2, Weekly Exercise (Minutes/Week): 90  Goals    . Patient Stated     To get off this walker       Depression Screen PHQ 2/9 Scores 01/03/2021 04/02/2020 03/17/2018 08/23/2017 12/11/2016  PHQ - 2 Score 0 0 0 0 0    Fall Risk Fall Risk  01/03/2021 03/17/2018 08/23/2017 12/11/2016  Falls in the past year? 1 No No No  Number falls in past yr: 1 - - -  Injury with Fall? 1 - - -  Comment fell onto  car - - -  Risk for fall due to : Impaired vision;Impaired balance/gait;Impaired mobility - - -  Follow up Falls prevention discussed - - -    FALL RISK PREVENTION PERTAINING TO THE HOME:  Any stairs in or around the home? Yes  If so, are there any without handrails? No  Home free of loose throw rugs in walkways, pet beds, electrical cords, etc? Yes  Adequate lighting in your home to reduce risk of falls? Yes   ASSISTIVE DEVICES UTILIZED TO PREVENT FALLS:  Life alert? No  Use of a cane, walker or w/c? Yes  Grab bars in the bathroom? No  Shower chair or bench in shower? Yes  Elevated toilet seat or a handicapped toilet? Yes   TIMED UP AND GO:  Was the test performed? Yes .  Length of time to ambulate 10 feet: 15 sec.   Gait slow and steady with assistive device  Cognitive Function:     6CIT Screen 01/03/2021  What Year? 0 points  What month? 0 points  Count back from 20 0 points  Months in reverse 0 points  Repeat phrase 0 points    Immunizations Immunization History  Administered Date(s) Administered  . PFIZER(Purple Top)SARS-COV-2 Vaccination 12/20/2019, 01/09/2020, 09/24/2020  . Pneumococcal Conjugate-13 11/11/2017, 11/24/2020  . Pneumococcal Polysaccharide-23 11/09/2014, 03/30/2020  . Td 10/09/2003  . Tdap 11/09/2013    TDAP status: Up to date  Flu Vaccine status: Declined, Education has been provided regarding the importance of this vaccine but patient still declined. Advised may receive this vaccine at local pharmacy or Health Dept. Aware to provide a copy of the vaccination record if obtained from local pharmacy or Health Dept. Verbalized acceptance and understanding.  Pneumococcal vaccine status: Up to date  Covid-19 vaccine status: Completed vaccines  Qualifies for Shingles Vaccine? Yes   Zostavax completed No   Shingrix Completed?: No.    Education has been provided regarding the importance of this vaccine. Patient has been advised to call insurance  company to determine out of pocket expense if they have not yet received this vaccine. Advised may also receive vaccine at local pharmacy or Health Dept. Verbalized acceptance and understanding.  Screening Tests Health Maintenance  Topic Date Due  . Hepatitis C Screening  Never done  . OPHTHALMOLOGY EXAM  Never done  . INFLUENZA VACCINE  02/13/2021 (Originally 05/08/2020)  . FOOT EXAM  03/30/2021  . HEMOGLOBIN A1C  05/21/2021  . COLONOSCOPY (Pts 45-19yrs Insurance coverage will need to be confirmed)  01/21/2023  . TETANUS/TDAP  11/10/2023  . COVID-19 Vaccine  Completed  . PNA vac Low Risk Adult  Completed  . HPV VACCINES  Aged Out    Health Maintenance  Health Maintenance Due  Topic Date Due  . Hepatitis C Screening  Never done  . OPHTHALMOLOGY EXAM  Never done    Colorectal cancer screening: Type of screening: Colonoscopy. Completed 01/20/18. Repeat every 5 years   Additional Screening:  Hepatitis C Screening: does qualify  Vision Screening: Recommended annual ophthalmology exams for early detection of glaucoma and other disorders of the eye. Is the patient up to date with their annual eye exam?  Yes  Who is the provider or what is the name of the office in which the patient attends annual eye exams? [facility] If pt is not established with a provider, would they like to be referred to a provider to establish care? No .   Dental Screening: Recommended annual dental exams for proper oral hygiene  [employer]  Referral / Chronic Care Management: CRR required this visit?  No   CCM required this visit?  No      Plan:     I have personally reviewed and noted the following in the patient's chart:   . Medical and social history . Use of alcohol, tobacco or illicit drugs  . Current medications and supplements . Functional ability and status . Nutritional status . Physical activity . Advanced directives . List of other physicians . Hospitalizations, surgeries, and  ER visits in previous 12 months . Vitals . Screenings to include cognitive, depression, and falls . Referrals and appointments  In addition, I have reviewed and discussed with patient certain preventive protocols, quality metrics, and best practice recommendations. A written personalized care plan for preventive services as well as general preventive health recommendations were provided to patient.     Willette Brace, LPN   [id number]   Nurse Notes: None

## 2021-01-04 DIAGNOSIS — I10 Essential (primary) hypertension: Secondary | ICD-10-CM | POA: Diagnosis not present

## 2021-01-04 DIAGNOSIS — Z9181 History of falling: Secondary | ICD-10-CM | POA: Diagnosis not present

## 2021-01-04 DIAGNOSIS — R261 Paralytic gait: Secondary | ICD-10-CM | POA: Diagnosis not present

## 2021-01-04 DIAGNOSIS — Z86711 Personal history of pulmonary embolism: Secondary | ICD-10-CM | POA: Diagnosis not present

## 2021-01-04 DIAGNOSIS — E119 Type 2 diabetes mellitus without complications: Secondary | ICD-10-CM | POA: Diagnosis not present

## 2021-01-04 DIAGNOSIS — Z7984 Long term (current) use of oral hypoglycemic drugs: Secondary | ICD-10-CM | POA: Diagnosis not present

## 2021-01-04 DIAGNOSIS — E785 Hyperlipidemia, unspecified: Secondary | ICD-10-CM | POA: Diagnosis not present

## 2021-01-04 DIAGNOSIS — J90 Pleural effusion, not elsewhere classified: Secondary | ICD-10-CM | POA: Diagnosis not present

## 2021-01-11 DIAGNOSIS — Z7984 Long term (current) use of oral hypoglycemic drugs: Secondary | ICD-10-CM | POA: Diagnosis not present

## 2021-01-11 DIAGNOSIS — I10 Essential (primary) hypertension: Secondary | ICD-10-CM | POA: Diagnosis not present

## 2021-01-11 DIAGNOSIS — E785 Hyperlipidemia, unspecified: Secondary | ICD-10-CM | POA: Diagnosis not present

## 2021-01-11 DIAGNOSIS — G8929 Other chronic pain: Secondary | ICD-10-CM | POA: Diagnosis not present

## 2021-01-11 DIAGNOSIS — E119 Type 2 diabetes mellitus without complications: Secondary | ICD-10-CM | POA: Diagnosis not present

## 2021-01-11 DIAGNOSIS — Z86711 Personal history of pulmonary embolism: Secondary | ICD-10-CM | POA: Diagnosis not present

## 2021-01-11 DIAGNOSIS — Z9181 History of falling: Secondary | ICD-10-CM | POA: Diagnosis not present

## 2021-01-11 DIAGNOSIS — R261 Paralytic gait: Secondary | ICD-10-CM | POA: Diagnosis not present

## 2021-01-12 DIAGNOSIS — S82851A Displaced trimalleolar fracture of right lower leg, initial encounter for closed fracture: Secondary | ICD-10-CM | POA: Diagnosis not present

## 2021-01-12 DIAGNOSIS — R5381 Other malaise: Secondary | ICD-10-CM | POA: Diagnosis not present

## 2021-01-12 DIAGNOSIS — I2699 Other pulmonary embolism without acute cor pulmonale: Secondary | ICD-10-CM | POA: Diagnosis not present

## 2021-01-19 ENCOUNTER — Telehealth: Payer: Self-pay | Admitting: Family Medicine

## 2021-01-19 DIAGNOSIS — Z86711 Personal history of pulmonary embolism: Secondary | ICD-10-CM | POA: Diagnosis not present

## 2021-01-19 DIAGNOSIS — Z7984 Long term (current) use of oral hypoglycemic drugs: Secondary | ICD-10-CM | POA: Diagnosis not present

## 2021-01-19 DIAGNOSIS — E785 Hyperlipidemia, unspecified: Secondary | ICD-10-CM | POA: Diagnosis not present

## 2021-01-19 DIAGNOSIS — Z9181 History of falling: Secondary | ICD-10-CM | POA: Diagnosis not present

## 2021-01-19 DIAGNOSIS — R261 Paralytic gait: Secondary | ICD-10-CM | POA: Diagnosis not present

## 2021-01-19 DIAGNOSIS — I10 Essential (primary) hypertension: Secondary | ICD-10-CM | POA: Diagnosis not present

## 2021-01-19 DIAGNOSIS — G8929 Other chronic pain: Secondary | ICD-10-CM | POA: Diagnosis not present

## 2021-01-19 DIAGNOSIS — E119 Type 2 diabetes mellitus without complications: Secondary | ICD-10-CM | POA: Diagnosis not present

## 2021-01-19 NOTE — Telephone Encounter (Signed)
Pete from [facility] called needing a verbal to extend home health PT  2 times a week for 4 weeks 1 time a week for 4 weeks  Laurey Arrow [facility] [phone]

## 2021-01-23 NOTE — Telephone Encounter (Signed)
Verbal given to [place].

## 2021-01-25 DIAGNOSIS — I10 Essential (primary) hypertension: Secondary | ICD-10-CM | POA: Diagnosis not present

## 2021-01-25 DIAGNOSIS — Z9181 History of falling: Secondary | ICD-10-CM | POA: Diagnosis not present

## 2021-01-25 DIAGNOSIS — Z7984 Long term (current) use of oral hypoglycemic drugs: Secondary | ICD-10-CM | POA: Diagnosis not present

## 2021-01-25 DIAGNOSIS — R261 Paralytic gait: Secondary | ICD-10-CM | POA: Diagnosis not present

## 2021-01-25 DIAGNOSIS — E119 Type 2 diabetes mellitus without complications: Secondary | ICD-10-CM | POA: Diagnosis not present

## 2021-01-25 DIAGNOSIS — E785 Hyperlipidemia, unspecified: Secondary | ICD-10-CM | POA: Diagnosis not present

## 2021-01-25 DIAGNOSIS — Z86711 Personal history of pulmonary embolism: Secondary | ICD-10-CM | POA: Diagnosis not present

## 2021-01-25 DIAGNOSIS — G8929 Other chronic pain: Secondary | ICD-10-CM | POA: Diagnosis not present

## 2021-01-27 DIAGNOSIS — E119 Type 2 diabetes mellitus without complications: Secondary | ICD-10-CM | POA: Diagnosis not present

## 2021-01-27 DIAGNOSIS — G8929 Other chronic pain: Secondary | ICD-10-CM | POA: Diagnosis not present

## 2021-01-27 DIAGNOSIS — R261 Paralytic gait: Secondary | ICD-10-CM | POA: Diagnosis not present

## 2021-01-27 DIAGNOSIS — Z7984 Long term (current) use of oral hypoglycemic drugs: Secondary | ICD-10-CM | POA: Diagnosis not present

## 2021-01-27 DIAGNOSIS — Z86711 Personal history of pulmonary embolism: Secondary | ICD-10-CM | POA: Diagnosis not present

## 2021-01-27 DIAGNOSIS — I10 Essential (primary) hypertension: Secondary | ICD-10-CM | POA: Diagnosis not present

## 2021-01-27 DIAGNOSIS — Z9181 History of falling: Secondary | ICD-10-CM | POA: Diagnosis not present

## 2021-01-27 DIAGNOSIS — E785 Hyperlipidemia, unspecified: Secondary | ICD-10-CM | POA: Diagnosis not present

## 2021-01-31 DIAGNOSIS — R261 Paralytic gait: Secondary | ICD-10-CM | POA: Diagnosis not present

## 2021-01-31 DIAGNOSIS — G8929 Other chronic pain: Secondary | ICD-10-CM | POA: Diagnosis not present

## 2021-01-31 DIAGNOSIS — Z9181 History of falling: Secondary | ICD-10-CM | POA: Diagnosis not present

## 2021-01-31 DIAGNOSIS — Z86711 Personal history of pulmonary embolism: Secondary | ICD-10-CM | POA: Diagnosis not present

## 2021-01-31 DIAGNOSIS — E119 Type 2 diabetes mellitus without complications: Secondary | ICD-10-CM | POA: Diagnosis not present

## 2021-01-31 DIAGNOSIS — I10 Essential (primary) hypertension: Secondary | ICD-10-CM | POA: Diagnosis not present

## 2021-01-31 DIAGNOSIS — Z7984 Long term (current) use of oral hypoglycemic drugs: Secondary | ICD-10-CM | POA: Diagnosis not present

## 2021-01-31 DIAGNOSIS — E785 Hyperlipidemia, unspecified: Secondary | ICD-10-CM | POA: Diagnosis not present

## 2021-02-02 DIAGNOSIS — Z86711 Personal history of pulmonary embolism: Secondary | ICD-10-CM | POA: Diagnosis not present

## 2021-02-02 DIAGNOSIS — I10 Essential (primary) hypertension: Secondary | ICD-10-CM | POA: Diagnosis not present

## 2021-02-02 DIAGNOSIS — G8929 Other chronic pain: Secondary | ICD-10-CM | POA: Diagnosis not present

## 2021-02-02 DIAGNOSIS — E785 Hyperlipidemia, unspecified: Secondary | ICD-10-CM | POA: Diagnosis not present

## 2021-02-02 DIAGNOSIS — Z7984 Long term (current) use of oral hypoglycemic drugs: Secondary | ICD-10-CM | POA: Diagnosis not present

## 2021-02-02 DIAGNOSIS — E119 Type 2 diabetes mellitus without complications: Secondary | ICD-10-CM | POA: Diagnosis not present

## 2021-02-02 DIAGNOSIS — Z9181 History of falling: Secondary | ICD-10-CM | POA: Diagnosis not present

## 2021-02-02 DIAGNOSIS — R261 Paralytic gait: Secondary | ICD-10-CM | POA: Diagnosis not present

## 2021-02-07 DIAGNOSIS — E785 Hyperlipidemia, unspecified: Secondary | ICD-10-CM | POA: Diagnosis not present

## 2021-02-07 DIAGNOSIS — I10 Essential (primary) hypertension: Secondary | ICD-10-CM | POA: Diagnosis not present

## 2021-02-07 DIAGNOSIS — Z86711 Personal history of pulmonary embolism: Secondary | ICD-10-CM | POA: Diagnosis not present

## 2021-02-07 DIAGNOSIS — Z7984 Long term (current) use of oral hypoglycemic drugs: Secondary | ICD-10-CM | POA: Diagnosis not present

## 2021-02-07 DIAGNOSIS — R261 Paralytic gait: Secondary | ICD-10-CM | POA: Diagnosis not present

## 2021-02-07 DIAGNOSIS — Z9181 History of falling: Secondary | ICD-10-CM | POA: Diagnosis not present

## 2021-02-07 DIAGNOSIS — G8929 Other chronic pain: Secondary | ICD-10-CM | POA: Diagnosis not present

## 2021-02-07 DIAGNOSIS — E119 Type 2 diabetes mellitus without complications: Secondary | ICD-10-CM | POA: Diagnosis not present

## 2021-02-08 DIAGNOSIS — Z9181 History of falling: Secondary | ICD-10-CM | POA: Diagnosis not present

## 2021-02-08 DIAGNOSIS — G8929 Other chronic pain: Secondary | ICD-10-CM | POA: Diagnosis not present

## 2021-02-08 DIAGNOSIS — Z7984 Long term (current) use of oral hypoglycemic drugs: Secondary | ICD-10-CM | POA: Diagnosis not present

## 2021-02-08 DIAGNOSIS — I10 Essential (primary) hypertension: Secondary | ICD-10-CM | POA: Diagnosis not present

## 2021-02-08 DIAGNOSIS — E785 Hyperlipidemia, unspecified: Secondary | ICD-10-CM | POA: Diagnosis not present

## 2021-02-08 DIAGNOSIS — E119 Type 2 diabetes mellitus without complications: Secondary | ICD-10-CM | POA: Diagnosis not present

## 2021-02-08 DIAGNOSIS — R261 Paralytic gait: Secondary | ICD-10-CM | POA: Diagnosis not present

## 2021-02-08 DIAGNOSIS — Z86711 Personal history of pulmonary embolism: Secondary | ICD-10-CM | POA: Diagnosis not present

## 2021-02-09 DIAGNOSIS — M4714 Other spondylosis with myelopathy, thoracic region: Secondary | ICD-10-CM | POA: Diagnosis not present

## 2021-02-09 DIAGNOSIS — M5416 Radiculopathy, lumbar region: Secondary | ICD-10-CM | POA: Diagnosis not present

## 2021-02-09 DIAGNOSIS — M47816 Spondylosis without myelopathy or radiculopathy, lumbar region: Secondary | ICD-10-CM | POA: Diagnosis not present

## 2021-02-09 DIAGNOSIS — I10 Essential (primary) hypertension: Secondary | ICD-10-CM | POA: Diagnosis not present

## 2021-02-09 DIAGNOSIS — M48062 Spinal stenosis, lumbar region with neurogenic claudication: Secondary | ICD-10-CM | POA: Diagnosis not present

## 2021-02-09 DIAGNOSIS — Z79899 Other long term (current) drug therapy: Secondary | ICD-10-CM | POA: Diagnosis not present

## 2021-02-09 DIAGNOSIS — M961 Postlaminectomy syndrome, not elsewhere classified: Secondary | ICD-10-CM | POA: Diagnosis not present

## 2021-02-11 DIAGNOSIS — R5381 Other malaise: Secondary | ICD-10-CM | POA: Diagnosis not present

## 2021-02-11 DIAGNOSIS — S82851A Displaced trimalleolar fracture of right lower leg, initial encounter for closed fracture: Secondary | ICD-10-CM | POA: Diagnosis not present

## 2021-02-11 DIAGNOSIS — I2699 Other pulmonary embolism without acute cor pulmonale: Secondary | ICD-10-CM | POA: Diagnosis not present

## 2021-02-13 DIAGNOSIS — Z9181 History of falling: Secondary | ICD-10-CM | POA: Diagnosis not present

## 2021-02-13 DIAGNOSIS — E785 Hyperlipidemia, unspecified: Secondary | ICD-10-CM | POA: Diagnosis not present

## 2021-02-13 DIAGNOSIS — E119 Type 2 diabetes mellitus without complications: Secondary | ICD-10-CM | POA: Diagnosis not present

## 2021-02-13 DIAGNOSIS — I10 Essential (primary) hypertension: Secondary | ICD-10-CM | POA: Diagnosis not present

## 2021-02-13 DIAGNOSIS — R261 Paralytic gait: Secondary | ICD-10-CM | POA: Diagnosis not present

## 2021-02-13 DIAGNOSIS — Z7984 Long term (current) use of oral hypoglycemic drugs: Secondary | ICD-10-CM | POA: Diagnosis not present

## 2021-02-13 DIAGNOSIS — Z86711 Personal history of pulmonary embolism: Secondary | ICD-10-CM | POA: Diagnosis not present

## 2021-02-13 DIAGNOSIS — G8929 Other chronic pain: Secondary | ICD-10-CM | POA: Diagnosis not present

## 2021-02-15 DIAGNOSIS — Z9181 History of falling: Secondary | ICD-10-CM | POA: Diagnosis not present

## 2021-02-15 DIAGNOSIS — E119 Type 2 diabetes mellitus without complications: Secondary | ICD-10-CM | POA: Diagnosis not present

## 2021-02-15 DIAGNOSIS — Z86711 Personal history of pulmonary embolism: Secondary | ICD-10-CM | POA: Diagnosis not present

## 2021-02-15 DIAGNOSIS — G8929 Other chronic pain: Secondary | ICD-10-CM | POA: Diagnosis not present

## 2021-02-15 DIAGNOSIS — R261 Paralytic gait: Secondary | ICD-10-CM | POA: Diagnosis not present

## 2021-02-15 DIAGNOSIS — Z7984 Long term (current) use of oral hypoglycemic drugs: Secondary | ICD-10-CM | POA: Diagnosis not present

## 2021-02-15 DIAGNOSIS — I10 Essential (primary) hypertension: Secondary | ICD-10-CM | POA: Diagnosis not present

## 2021-02-15 DIAGNOSIS — E785 Hyperlipidemia, unspecified: Secondary | ICD-10-CM | POA: Diagnosis not present

## 2021-02-21 DIAGNOSIS — E119 Type 2 diabetes mellitus without complications: Secondary | ICD-10-CM | POA: Diagnosis not present

## 2021-02-21 DIAGNOSIS — Z9181 History of falling: Secondary | ICD-10-CM | POA: Diagnosis not present

## 2021-02-21 DIAGNOSIS — R261 Paralytic gait: Secondary | ICD-10-CM | POA: Diagnosis not present

## 2021-02-21 DIAGNOSIS — E785 Hyperlipidemia, unspecified: Secondary | ICD-10-CM | POA: Diagnosis not present

## 2021-02-21 DIAGNOSIS — Z7984 Long term (current) use of oral hypoglycemic drugs: Secondary | ICD-10-CM | POA: Diagnosis not present

## 2021-02-21 DIAGNOSIS — I10 Essential (primary) hypertension: Secondary | ICD-10-CM | POA: Diagnosis not present

## 2021-02-21 DIAGNOSIS — Z86711 Personal history of pulmonary embolism: Secondary | ICD-10-CM | POA: Diagnosis not present

## 2021-02-21 DIAGNOSIS — G8929 Other chronic pain: Secondary | ICD-10-CM | POA: Diagnosis not present

## 2021-02-22 DIAGNOSIS — M47816 Spondylosis without myelopathy or radiculopathy, lumbar region: Secondary | ICD-10-CM | POA: Diagnosis not present

## 2021-02-22 DIAGNOSIS — I10 Essential (primary) hypertension: Secondary | ICD-10-CM | POA: Diagnosis not present

## 2021-02-28 DIAGNOSIS — E785 Hyperlipidemia, unspecified: Secondary | ICD-10-CM | POA: Diagnosis not present

## 2021-02-28 DIAGNOSIS — E119 Type 2 diabetes mellitus without complications: Secondary | ICD-10-CM | POA: Diagnosis not present

## 2021-02-28 DIAGNOSIS — Z9181 History of falling: Secondary | ICD-10-CM | POA: Diagnosis not present

## 2021-02-28 DIAGNOSIS — Z7984 Long term (current) use of oral hypoglycemic drugs: Secondary | ICD-10-CM | POA: Diagnosis not present

## 2021-02-28 DIAGNOSIS — I10 Essential (primary) hypertension: Secondary | ICD-10-CM | POA: Diagnosis not present

## 2021-02-28 DIAGNOSIS — G8929 Other chronic pain: Secondary | ICD-10-CM | POA: Diagnosis not present

## 2021-02-28 DIAGNOSIS — R261 Paralytic gait: Secondary | ICD-10-CM | POA: Diagnosis not present

## 2021-02-28 DIAGNOSIS — Z86711 Personal history of pulmonary embolism: Secondary | ICD-10-CM | POA: Diagnosis not present

## 2021-03-07 DIAGNOSIS — E785 Hyperlipidemia, unspecified: Secondary | ICD-10-CM | POA: Diagnosis not present

## 2021-03-07 DIAGNOSIS — I10 Essential (primary) hypertension: Secondary | ICD-10-CM | POA: Diagnosis not present

## 2021-03-07 DIAGNOSIS — Z7984 Long term (current) use of oral hypoglycemic drugs: Secondary | ICD-10-CM | POA: Diagnosis not present

## 2021-03-07 DIAGNOSIS — R261 Paralytic gait: Secondary | ICD-10-CM | POA: Diagnosis not present

## 2021-03-07 DIAGNOSIS — G8929 Other chronic pain: Secondary | ICD-10-CM | POA: Diagnosis not present

## 2021-03-07 DIAGNOSIS — Z9181 History of falling: Secondary | ICD-10-CM | POA: Diagnosis not present

## 2021-03-07 DIAGNOSIS — Z86711 Personal history of pulmonary embolism: Secondary | ICD-10-CM | POA: Diagnosis not present

## 2021-03-07 DIAGNOSIS — E119 Type 2 diabetes mellitus without complications: Secondary | ICD-10-CM | POA: Diagnosis not present

## 2021-03-11 ENCOUNTER — Other Ambulatory Visit: Payer: Self-pay | Admitting: Family Medicine

## 2021-03-14 DIAGNOSIS — I2699 Other pulmonary embolism without acute cor pulmonale: Secondary | ICD-10-CM | POA: Diagnosis not present

## 2021-03-14 DIAGNOSIS — S82851A Displaced trimalleolar fracture of right lower leg, initial encounter for closed fracture: Secondary | ICD-10-CM | POA: Diagnosis not present

## 2021-03-14 DIAGNOSIS — R5381 Other malaise: Secondary | ICD-10-CM | POA: Diagnosis not present

## 2021-03-15 DIAGNOSIS — E119 Type 2 diabetes mellitus without complications: Secondary | ICD-10-CM | POA: Diagnosis not present

## 2021-03-15 DIAGNOSIS — I10 Essential (primary) hypertension: Secondary | ICD-10-CM | POA: Diagnosis not present

## 2021-03-15 DIAGNOSIS — Z9181 History of falling: Secondary | ICD-10-CM | POA: Diagnosis not present

## 2021-03-15 DIAGNOSIS — R261 Paralytic gait: Secondary | ICD-10-CM | POA: Diagnosis not present

## 2021-03-15 DIAGNOSIS — G8929 Other chronic pain: Secondary | ICD-10-CM | POA: Diagnosis not present

## 2021-03-15 DIAGNOSIS — Z7984 Long term (current) use of oral hypoglycemic drugs: Secondary | ICD-10-CM | POA: Diagnosis not present

## 2021-03-15 DIAGNOSIS — E785 Hyperlipidemia, unspecified: Secondary | ICD-10-CM | POA: Diagnosis not present

## 2021-03-15 DIAGNOSIS — Z86711 Personal history of pulmonary embolism: Secondary | ICD-10-CM | POA: Diagnosis not present

## 2021-04-13 DIAGNOSIS — R5381 Other malaise: Secondary | ICD-10-CM | POA: Diagnosis not present

## 2021-04-13 DIAGNOSIS — I2699 Other pulmonary embolism without acute cor pulmonale: Secondary | ICD-10-CM | POA: Diagnosis not present

## 2021-04-13 DIAGNOSIS — S82851A Displaced trimalleolar fracture of right lower leg, initial encounter for closed fracture: Secondary | ICD-10-CM | POA: Diagnosis not present

## 2021-05-09 DIAGNOSIS — M48062 Spinal stenosis, lumbar region with neurogenic claudication: Secondary | ICD-10-CM | POA: Diagnosis not present

## 2021-05-09 DIAGNOSIS — M4714 Other spondylosis with myelopathy, thoracic region: Secondary | ICD-10-CM | POA: Diagnosis not present

## 2021-05-09 DIAGNOSIS — M961 Postlaminectomy syndrome, not elsewhere classified: Secondary | ICD-10-CM | POA: Diagnosis not present

## 2021-05-09 DIAGNOSIS — M47816 Spondylosis without myelopathy or radiculopathy, lumbar region: Secondary | ICD-10-CM | POA: Diagnosis not present

## 2021-05-09 DIAGNOSIS — Z79899 Other long term (current) drug therapy: Secondary | ICD-10-CM | POA: Diagnosis not present

## 2021-05-14 DIAGNOSIS — I2699 Other pulmonary embolism without acute cor pulmonale: Secondary | ICD-10-CM | POA: Diagnosis not present

## 2021-05-14 DIAGNOSIS — R5381 Other malaise: Secondary | ICD-10-CM | POA: Diagnosis not present

## 2021-05-14 DIAGNOSIS — S82851A Displaced trimalleolar fracture of right lower leg, initial encounter for closed fracture: Secondary | ICD-10-CM | POA: Diagnosis not present

## 2021-06-03 ENCOUNTER — Other Ambulatory Visit: Payer: Self-pay | Admitting: Family Medicine

## 2021-06-13 DIAGNOSIS — M47816 Spondylosis without myelopathy or radiculopathy, lumbar region: Secondary | ICD-10-CM | POA: Diagnosis not present

## 2021-06-14 DIAGNOSIS — I2699 Other pulmonary embolism without acute cor pulmonale: Secondary | ICD-10-CM | POA: Diagnosis not present

## 2021-06-14 DIAGNOSIS — R5381 Other malaise: Secondary | ICD-10-CM | POA: Diagnosis not present

## 2021-06-14 DIAGNOSIS — S82851A Displaced trimalleolar fracture of right lower leg, initial encounter for closed fracture: Secondary | ICD-10-CM | POA: Diagnosis not present

## 2021-06-25 ENCOUNTER — Other Ambulatory Visit: Payer: Self-pay | Admitting: Family Medicine

## 2021-06-27 DIAGNOSIS — I10 Essential (primary) hypertension: Secondary | ICD-10-CM | POA: Diagnosis not present

## 2021-06-27 DIAGNOSIS — M47816 Spondylosis without myelopathy or radiculopathy, lumbar region: Secondary | ICD-10-CM | POA: Diagnosis not present

## 2021-07-14 DIAGNOSIS — S82851A Displaced trimalleolar fracture of right lower leg, initial encounter for closed fracture: Secondary | ICD-10-CM | POA: Diagnosis not present

## 2021-07-14 DIAGNOSIS — R5381 Other malaise: Secondary | ICD-10-CM | POA: Diagnosis not present

## 2021-07-14 DIAGNOSIS — I2699 Other pulmonary embolism without acute cor pulmonale: Secondary | ICD-10-CM | POA: Diagnosis not present

## 2021-07-27 ENCOUNTER — Other Ambulatory Visit: Payer: Self-pay | Admitting: Family Medicine

## 2021-08-07 ENCOUNTER — Emergency Department (HOSPITAL_COMMUNITY): Payer: Medicare Other

## 2021-08-07 ENCOUNTER — Inpatient Hospital Stay (HOSPITAL_COMMUNITY)
Admission: EM | Admit: 2021-08-07 | Discharge: 2021-08-21 | DRG: 458 | Disposition: A | Discharge status: Rehabilitation Facility | Payer: Medicare Other | Attending: Neurological Surgery | Admitting: Neurological Surgery

## 2021-08-07 ENCOUNTER — Other Ambulatory Visit: Payer: Self-pay

## 2021-08-07 DIAGNOSIS — E785 Hyperlipidemia, unspecified: Secondary | ICD-10-CM | POA: Diagnosis not present

## 2021-08-07 DIAGNOSIS — E1169 Type 2 diabetes mellitus with other specified complication: Secondary | ICD-10-CM | POA: Diagnosis present

## 2021-08-07 DIAGNOSIS — Z8669 Personal history of other diseases of the nervous system and sense organs: Secondary | ICD-10-CM | POA: Diagnosis not present

## 2021-08-07 DIAGNOSIS — M545 Low back pain, unspecified: Secondary | ICD-10-CM | POA: Diagnosis not present

## 2021-08-07 DIAGNOSIS — R531 Weakness: Secondary | ICD-10-CM

## 2021-08-07 DIAGNOSIS — Z20822 Contact with and (suspected) exposure to covid-19: Secondary | ICD-10-CM | POA: Diagnosis present

## 2021-08-07 DIAGNOSIS — Z79899 Other long term (current) drug therapy: Secondary | ICD-10-CM

## 2021-08-07 DIAGNOSIS — M4184 Other forms of scoliosis, thoracic region: Secondary | ICD-10-CM | POA: Diagnosis not present

## 2021-08-07 DIAGNOSIS — Z888 Allergy status to other drugs, medicaments and biological substances status: Secondary | ICD-10-CM | POA: Diagnosis not present

## 2021-08-07 DIAGNOSIS — E1142 Type 2 diabetes mellitus with diabetic polyneuropathy: Secondary | ICD-10-CM | POA: Diagnosis present

## 2021-08-07 DIAGNOSIS — N179 Acute kidney failure, unspecified: Secondary | ICD-10-CM | POA: Diagnosis not present

## 2021-08-07 DIAGNOSIS — M1990 Unspecified osteoarthritis, unspecified site: Secondary | ICD-10-CM | POA: Diagnosis not present

## 2021-08-07 DIAGNOSIS — M48061 Spinal stenosis, lumbar region without neurogenic claudication: Secondary | ICD-10-CM | POA: Diagnosis not present

## 2021-08-07 DIAGNOSIS — M48062 Spinal stenosis, lumbar region with neurogenic claudication: Secondary | ICD-10-CM | POA: Diagnosis not present

## 2021-08-07 DIAGNOSIS — G959 Disease of spinal cord, unspecified: Secondary | ICD-10-CM | POA: Diagnosis not present

## 2021-08-07 DIAGNOSIS — Z981 Arthrodesis status: Secondary | ICD-10-CM | POA: Diagnosis not present

## 2021-08-07 DIAGNOSIS — Z7901 Long term (current) use of anticoagulants: Secondary | ICD-10-CM

## 2021-08-07 DIAGNOSIS — D62 Acute posthemorrhagic anemia: Secondary | ICD-10-CM | POA: Diagnosis not present

## 2021-08-07 DIAGNOSIS — E876 Hypokalemia: Secondary | ICD-10-CM | POA: Diagnosis not present

## 2021-08-07 DIAGNOSIS — G894 Chronic pain syndrome: Secondary | ICD-10-CM | POA: Diagnosis not present

## 2021-08-07 DIAGNOSIS — G8929 Other chronic pain: Secondary | ICD-10-CM | POA: Diagnosis not present

## 2021-08-07 DIAGNOSIS — Z886 Allergy status to analgesic agent status: Secondary | ICD-10-CM | POA: Diagnosis not present

## 2021-08-07 DIAGNOSIS — M4804 Spinal stenosis, thoracic region: Principal | ICD-10-CM | POA: Diagnosis present

## 2021-08-07 DIAGNOSIS — M4714 Other spondylosis with myelopathy, thoracic region: Secondary | ICD-10-CM | POA: Diagnosis present

## 2021-08-07 DIAGNOSIS — R29898 Other symptoms and signs involving the musculoskeletal system: Secondary | ICD-10-CM | POA: Diagnosis present

## 2021-08-07 DIAGNOSIS — R5381 Other malaise: Secondary | ICD-10-CM | POA: Diagnosis not present

## 2021-08-07 DIAGNOSIS — G9520 Unspecified cord compression: Secondary | ICD-10-CM | POA: Diagnosis not present

## 2021-08-07 DIAGNOSIS — M4154 Other secondary scoliosis, thoracic region: Secondary | ICD-10-CM | POA: Diagnosis not present

## 2021-08-07 DIAGNOSIS — Z66 Do not resuscitate: Secondary | ICD-10-CM | POA: Diagnosis present

## 2021-08-07 DIAGNOSIS — Z419 Encounter for procedure for purposes other than remedying health state, unspecified: Secondary | ICD-10-CM

## 2021-08-07 DIAGNOSIS — M62838 Other muscle spasm: Secondary | ICD-10-CM | POA: Diagnosis not present

## 2021-08-07 DIAGNOSIS — J301 Allergic rhinitis due to pollen: Secondary | ICD-10-CM | POA: Diagnosis not present

## 2021-08-07 DIAGNOSIS — Z79891 Long term (current) use of opiate analgesic: Secondary | ICD-10-CM

## 2021-08-07 DIAGNOSIS — M5442 Lumbago with sciatica, left side: Secondary | ICD-10-CM | POA: Diagnosis not present

## 2021-08-07 DIAGNOSIS — M961 Postlaminectomy syndrome, not elsewhere classified: Secondary | ICD-10-CM | POA: Diagnosis not present

## 2021-08-07 DIAGNOSIS — I2699 Other pulmonary embolism without acute cor pulmonale: Secondary | ICD-10-CM | POA: Diagnosis not present

## 2021-08-07 DIAGNOSIS — M4325 Fusion of spine, thoracolumbar region: Secondary | ICD-10-CM | POA: Diagnosis not present

## 2021-08-07 DIAGNOSIS — Z86711 Personal history of pulmonary embolism: Secondary | ICD-10-CM | POA: Diagnosis not present

## 2021-08-07 DIAGNOSIS — Z87891 Personal history of nicotine dependence: Secondary | ICD-10-CM

## 2021-08-07 DIAGNOSIS — M47896 Other spondylosis, lumbar region: Secondary | ICD-10-CM | POA: Diagnosis not present

## 2021-08-07 DIAGNOSIS — G992 Myelopathy in diseases classified elsewhere: Secondary | ICD-10-CM | POA: Diagnosis not present

## 2021-08-07 DIAGNOSIS — M4324 Fusion of spine, thoracic region: Secondary | ICD-10-CM | POA: Diagnosis not present

## 2021-08-07 DIAGNOSIS — I10 Essential (primary) hypertension: Secondary | ICD-10-CM | POA: Diagnosis not present

## 2021-08-07 DIAGNOSIS — E1165 Type 2 diabetes mellitus with hyperglycemia: Secondary | ICD-10-CM | POA: Diagnosis not present

## 2021-08-07 DIAGNOSIS — J9601 Acute respiratory failure with hypoxia: Secondary | ICD-10-CM | POA: Diagnosis not present

## 2021-08-07 DIAGNOSIS — M47816 Spondylosis without myelopathy or radiculopathy, lumbar region: Secondary | ICD-10-CM | POA: Diagnosis not present

## 2021-08-07 DIAGNOSIS — S82851A Displaced trimalleolar fracture of right lower leg, initial encounter for closed fracture: Secondary | ICD-10-CM | POA: Diagnosis not present

## 2021-08-07 DIAGNOSIS — M4805 Spinal stenosis, thoracolumbar region: Secondary | ICD-10-CM | POA: Diagnosis not present

## 2021-08-07 DIAGNOSIS — Z7984 Long term (current) use of oral hypoglycemic drugs: Secondary | ICD-10-CM | POA: Diagnosis not present

## 2021-08-07 DIAGNOSIS — G9589 Other specified diseases of spinal cord: Secondary | ICD-10-CM | POA: Diagnosis not present

## 2021-08-07 HISTORY — PX: BACK SURGERY: SHX140

## 2021-08-07 LAB — CBC
HCT: 39.3 % (ref 39.0–52.0)
Hemoglobin: 12.4 g/dL — ABNORMAL LOW (ref 13.0–17.0)
MCH: 30.2 pg (ref 26.0–34.0)
MCHC: 31.6 g/dL (ref 30.0–36.0)
MCV: 95.6 fL (ref 80.0–100.0)
Platelets: 316 10*3/uL (ref 150–400)
RBC: 4.11 MIL/uL — ABNORMAL LOW (ref 4.22–5.81)
RDW: 14.4 % (ref 11.5–15.5)
WBC: 7.2 10*3/uL (ref 4.0–10.5)
nRBC: 0 % (ref 0.0–0.2)

## 2021-08-07 LAB — BASIC METABOLIC PANEL
Anion gap: 8 (ref 5–15)
BUN: 23 mg/dL (ref 8–23)
CO2: 27 mmol/L (ref 22–32)
Calcium: 10.2 mg/dL (ref 8.9–10.3)
Chloride: 108 mmol/L (ref 98–111)
Creatinine, Ser: 1.55 mg/dL — ABNORMAL HIGH (ref 0.61–1.24)
GFR, Estimated: 48 mL/min — ABNORMAL LOW (ref >60)
Glucose, Bld: 120 mg/dL — ABNORMAL HIGH (ref 70–99)
Potassium: 4.1 mmol/L (ref 3.5–5.1)
Sodium: 143 mmol/L (ref 135–145)

## 2021-08-07 NOTE — ED Provider Notes (Signed)
Emergency Medicine Provider Triage Evaluation Note  Keith [name] , a 70 y.o. male  was evaluated in triage.  Pt complains of bilateral progressive leg weakness x2 weeks.  Patient has history of chronic back pain and back surgeries, recently had II nerve blocks performed 2 weeks ago.  He is now noting numbness and weakness in both legs, and states he is unable to lift his feet or walk.  He denies any back pain, loss of bowel or bladder control.  Review of Systems  Positive: Bilateral leg numbness and weakness Negative: Back pain, leg pain, leg swelling, chest pain, shortness of breath, fevers, saddle anesthesia, urinary retention, incontinence of bowel or bladder  Physical Exam  BP 131/83 (BP Location: Left Arm)   Pulse 71   Temp 98.9 F (37.2 C) (Oral)   Resp 16   SpO2 100%  Gen:   Awake, no distress   Resp:  Normal effort  MSK:   Moves extremities without difficulty  Other:  Normal passive ROM of bilateral legs, decreased strength in bilateral hips and knees, full strength in ankles, no leg swelling, pulses normal and equal in bilateral lower extremities  Medical Decision Making  Medically screening exam initiated at 1:23 PM.  Appropriate orders placed.  [name] was informed that the remainder of the evaluation will be completed by another provider, this initial triage assessment does not replace that evaluation, and the importance of remaining in the ED until their evaluation is complete.  With recent procedure, concern for myelopathy, plan to obtain labs to evaluate numbness as well as imaging of spine.  Last had spine imaging done in 2019   Esco Joslyn T, PA-C 08/07/21 1325    Horton, Alvin Critchley, DO 08/08/21 [phone]

## 2021-08-07 NOTE — ED Triage Notes (Signed)
Pt with hx of chronic back pain and back surgeries here for eval of progressive leg weakness since having two nerve blocks performed two weeks ago. Reports numbness and weakness to BLE, is unable to even lift his feet. Denies back pain or loss of bowel or bladder control.

## 2021-08-08 DIAGNOSIS — R29898 Other symptoms and signs involving the musculoskeletal system: Secondary | ICD-10-CM | POA: Diagnosis present

## 2021-08-08 LAB — PROTIME-INR
INR: 1.2 (ref 0.8–1.2)
Prothrombin Time: 15.1 seconds (ref 11.4–15.2)

## 2021-08-08 LAB — CBG MONITORING, ED: Glucose-Capillary: 148 mg/dL — ABNORMAL HIGH (ref 70–99)

## 2021-08-08 LAB — APTT: aPTT: 31 seconds (ref 24–36)

## 2021-08-08 LAB — RESP PANEL BY RT-PCR (FLU A&B, COVID) ARPGX2
Influenza A by PCR: NEGATIVE
Influenza B by PCR: NEGATIVE
SARS Coronavirus 2 by RT PCR: NEGATIVE

## 2021-08-08 MED ORDER — OXYCODONE HCL 5 MG PO TABS: 2.5000 mg | ORAL_TABLET | Freq: Four times a day (QID) | ORAL | Status: DC | PRN

## 2021-08-08 MED ORDER — HYDROMORPHONE HCL 1 MG/ML IJ SOLN
1.0000 mg | INTRAMUSCULAR | Status: DC | PRN
Start: 2021-08-08 — End: 2021-08-21
  Administered 2021-08-10 – 2021-08-20 (×15): 1 mg via INTRAVENOUS
  Filled 2021-08-08 (×17): qty 1

## 2021-08-08 MED ORDER — DULOXETINE HCL 60 MG PO CPEP
60.0000 mg | ORAL_CAPSULE | Freq: Every day | ORAL | Status: DC
  Administered 2021-08-08 – 2021-08-21 (×14): 60 mg via ORAL
  Filled 2021-08-08 (×14): qty 1

## 2021-08-08 MED ORDER — POTASSIUM CHLORIDE CRYS ER 20 MEQ PO TBCR
20.0000 meq | EXTENDED_RELEASE_TABLET | Freq: Every day | ORAL | Status: DC
  Administered 2021-08-08 – 2021-08-21 (×14): 20 meq via ORAL
  Filled 2021-08-08 (×14): qty 1

## 2021-08-08 MED ORDER — ATENOLOL 25 MG PO TABS
50.0000 mg | ORAL_TABLET | Freq: Every day | ORAL | Status: DC
  Administered 2021-08-08 – 2021-08-21 (×14): 50 mg via ORAL
  Filled 2021-08-08 (×14): qty 2

## 2021-08-08 MED ORDER — ATENOLOL-CHLORTHALIDONE 50-25 MG PO TABS: 1.0000 | ORAL_TABLET | Freq: Every day | ORAL | Status: DC

## 2021-08-08 MED ORDER — METFORMIN HCL 500 MG PO TABS
250.0000 mg | ORAL_TABLET | Freq: Every day | ORAL | Status: DC
  Administered 2021-08-08 – 2021-08-21 (×14): 250 mg via ORAL
  Filled 2021-08-08 (×14): qty 1

## 2021-08-08 MED ORDER — SIMVASTATIN 20 MG PO TABS
40.0000 mg | ORAL_TABLET | Freq: Every day | ORAL | Status: DC
  Administered 2021-08-08 – 2021-08-20 (×12): 40 mg via ORAL
  Filled 2021-08-08 (×12): qty 2

## 2021-08-08 MED ORDER — POLYETHYLENE GLYCOL 3350 17 G PO PACK
17.0000 g | PACK | Freq: Every day | ORAL | Status: DC | PRN
  Filled 2021-08-08: qty 1

## 2021-08-08 MED ORDER — ADULT MULTIVITAMIN W/MINERALS CH
1.0000 | ORAL_TABLET | Freq: Every day | ORAL | Status: DC
  Administered 2021-08-08 – 2021-08-21 (×14): 1 via ORAL
  Filled 2021-08-08 (×14): qty 1

## 2021-08-08 MED ORDER — CLONIDINE HCL 0.1 MG PO TABS
0.1000 mg | ORAL_TABLET | Freq: Every day | ORAL | Status: DC
  Administered 2021-08-08 – 2021-08-20 (×13): 0.1 mg via ORAL
  Filled 2021-08-08 (×14): qty 1

## 2021-08-08 MED ORDER — OXYCODONE-ACETAMINOPHEN 5-325 MG PO TABS
1.0000 | ORAL_TABLET | Freq: Four times a day (QID) | ORAL | Status: DC | PRN
  Administered 2021-08-08 (×2): 1 via ORAL
  Filled 2021-08-08 (×2): qty 1

## 2021-08-08 MED ORDER — GABAPENTIN 300 MG PO CAPS
600.0000 mg | ORAL_CAPSULE | Freq: Three times a day (TID) | ORAL | Status: DC
  Administered 2021-08-08 – 2021-08-21 (×39): 600 mg via ORAL
  Filled 2021-08-08 (×36): qty 2
  Filled 2021-08-08: qty 6
  Filled 2021-08-08 (×2): qty 2

## 2021-08-08 MED ORDER — ALUM & MAG HYDROXIDE-SIMETH 200-200-20 MG/5ML PO SUSP: 15.0000 mL | Freq: Three times a day (TID) | ORAL | Status: DC | PRN

## 2021-08-08 MED ORDER — OXYCODONE-ACETAMINOPHEN 5-325 MG PO TABS
1.0000 | ORAL_TABLET | ORAL | Status: DC | PRN
  Administered 2021-08-09 – 2021-08-15 (×23): 2 via ORAL
  Administered 2021-08-15: 1 via ORAL
  Administered 2021-08-15 – 2021-08-18 (×17): 2 via ORAL
  Administered 2021-08-18: 1 via ORAL
  Administered 2021-08-19 – 2021-08-21 (×13): 2 via ORAL
  Filled 2021-08-08 (×7): qty 2
  Filled 2021-08-08: qty 1
  Filled 2021-08-08 (×25): qty 2
  Filled 2021-08-08: qty 1
  Filled 2021-08-08 (×22): qty 2

## 2021-08-08 MED ORDER — CHLORTHALIDONE 25 MG PO TABS
25.0000 mg | ORAL_TABLET | Freq: Every day | ORAL | Status: DC
  Administered 2021-08-08 – 2021-08-21 (×14): 25 mg via ORAL
  Filled 2021-08-08 (×14): qty 1

## 2021-08-08 MED ORDER — OXYCODONE-ACETAMINOPHEN 7.5-325 MG PO TABS: 1.0000 | ORAL_TABLET | Freq: Four times a day (QID) | ORAL | Status: DC | PRN

## 2021-08-08 NOTE — ED Notes (Signed)
Contacted [place] MD regarding ordering pt's home meds

## 2021-08-08 NOTE — H&P (Signed)
Neurosurgery H&P  CC: Leg weakness  HPI: This is a 70 y.o. man with a few week history of progressive bilateral lower extremity weakness. He was having severe back pain, had an ESI, and had excellent relief of his back pain for 7-10 days, after which he began having weakness. No change in bowel/bladder function, some very mild numbness that he vaguely feels in the entirety of both legs, starting roughly at the inguinal ligament. No F/C/N/V, no significant change in his low back pain.   ROS: A 14 point ROS was performed and is negative except as noted in the HPI.   PMHx:  Past Medical History:  Diagnosis Date   Allergy    Chronic back pain    Diabetes mellitus without complication ([place])    DNR (do not resuscitate) 11/19/2020   Duodenal ulcer hemorrhage 08/29/2014   ED (erectile dysfunction)    Esophageal stricture 08/30/2014   Glucose intolerance (impaired glucose tolerance)    Hiatal hernia 08/30/2014   Hypertension    Hypokalemia 04/11/2013   MRSA carrier 08/30/2014   Obesity    Osteoarthritis    Pneumonia    Pulmonary embolism ([place]) 06/2020   Scoliosis 2016   Spinal stenosis of lumbar region    Urinary tract infection 04/11/2013   FamHx:  Family History  Problem Relation Age of Onset   Diabetes Mother    Asthma Mother    Hypertension Father    Stroke Father    Colon cancer Neg Hx    SocHx:  reports that he quit smoking about 27 years ago. He has a 25.00 pack-year smoking history. He has never used smokeless tobacco. He reports current alcohol use. He reports that he does not use drugs.  Exam: Vital signs in last 24 hours: Temp:  [98.1 F (36.7 C)-98.7 F (37.1 C)] 98.1 F (36.7 C) (11/01 2024) Pulse Rate:  [72-80] 77 (11/01 2024) Resp:  [15-16] 16 (11/01 2024) BP: (116-134)/(66-89) 116/66 (11/01 2024) SpO2:  [95 %-100 %] 100 % (11/01 2024) Weight:  [128.3 kg] 128.3 kg (11/01 1639) General: Awake, alert, cooperative, lying in bed in NAD Head: Normocephalic and  atruamatic HEENT: Neck supple Pulmonary: breathing room air comfortably, no evidence of increased work of breathing Cardiac: RRR Abdomen: S NT ND Extremities: Warm and well perfused x4 Neuro: AOx3, PERRL, EOMI, FS Strength 5/5 in BUE, BLE are diffusely 2/5 on the left and 3/5 on the right, increased reflexes at the left patella and normal on the right, +ankle clonus on left and not right, mild T12 sensory level  Assessment and Plan: 70 y.o. man with progressive BLE weakness and numbness, concerning for low thoracic myelopathy. MRI L-spine personally reviewed, which shows a grade 1 L4-5 spondy, moderate R L1-2 and L2-3 foraminal stenosis, L3-4 severe canal / bilateral foraminal stenosis, R>L L4-5 foraminal stenosis and lateral recess stenosis with some left sided 5-1 foraminal stenosis.  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

## 2021-08-08 NOTE — ED Provider Notes (Signed)
[facility] EMERGENCY DEPARTMENT Provider Note   CSN: [id number] Arrival date & time: 08/07/21  1227     History Chief Complaint  Patient presents with   Extremity Weakness    Keith [name] is a 70 y.o. male.  The history is provided by the patient.  Extremity Weakness This is a new problem. The current episode started more than 1 week ago. The problem occurs daily. The problem has been gradually worsening. Pertinent negatives include no chest pain, no abdominal pain, no headaches and no shortness of breath. The symptoms are aggravated by walking. The symptoms are relieved by rest.  Patient presents with bilateral leg weakness Patient reports he has a long history of back pain has had previous laminectomies, most recent was 2019.  Patient reports he struggles with low back pain frequently.  He reports he underwent to "nerve blockers" in his back last month He reports the pain improved, but approximately 7 days later he began having bilateral leg weakness.  This is been worsening.  He denies significant numbness in his legs. He is now unable to ambulate.  Denies any urinary and bladder incontinence.  He reports he was seen by Dr. Brien Few today who recommended going to the ER He denies any worsening back pain  Past Medical History:  Diagnosis Date   Allergy    Chronic back pain    Diabetes mellitus without complication ([place])    DNR (do not resuscitate) 11/19/2020   Duodenal ulcer hemorrhage 08/29/2014   ED (erectile dysfunction)    Esophageal stricture 08/30/2014   Glucose intolerance (impaired glucose tolerance)    Hiatal hernia 08/30/2014   Hypertension    Hypokalemia 04/11/2013   MRSA carrier 08/30/2014   Obesity    Osteoarthritis    Pneumonia    Pulmonary embolism ([place]) 06/2020   Scoliosis 2016   Spinal stenosis of lumbar region    Urinary tract infection 04/11/2013    Patient Active Problem List   Diagnosis Date Noted   Bilateral leg weakness  08/08/2021   CAP (community acquired pneumonia) 11/19/2020   Loculated pleural effusion 11/19/2020   DNR (do not resuscitate) 11/19/2020   Closed right ankle fracture    Labile blood glucose    Hypokalemia    Drug induced constipation    Debility    Hyponatremia    Diabetic peripheral neuropathy ([place])    Acute blood loss anemia    Chronic pain syndrome    Postoperative pain    Pulmonary emboli ([place]) 07/04/2020   Closed right trimalleolar fracture 07/02/2020   Acute respiratory failure with hypoxia ([place])    Acute saddle pulmonary embolism with acute cor pulmonale ([place]) 06/26/2020   Lumbar spinal stenosis 06/01/2020   Spinal cord compression ([place]) 04/02/2020   Thoracic myelopathy 07/25/2018   Hyperlipidemia associated with type 2 diabetes mellitus ([place]) 11/14/2016   Type 2 diabetes mellitus with diabetic neuropathy, unspecified ([place]) 11/14/2016   Routine general medical examination at a health care facility 11/14/2015   Hiatal hernia 08/30/2014   AKI (acute kidney injury) ([place]) 04/10/2013   Allergic rhinitis 04/10/2013   Spinal stenosis of lumbar region 11/06/2012   Allergic rhinitis due to pollen 01/15/2011   Osteoarthritis 11/02/2008   HIP PAIN, LEFT 02/09/2008   Class 2 obesity due to excess calories with body mass index (BMI) of 36.0 to 36.9 in adult 11/07/2007   ERECTILE DYSFUNCTION, MILD 11/07/2007   Essential hypertension 11/07/2007    Past Surgical History:  Procedure Laterality Date  COLONOSCOPY  2008   ESOPHAGOGASTRODUODENOSCOPY N/A 08/29/2014   Procedure: ESOPHAGOGASTRODUODENOSCOPY (EGD);  Surgeon: Lafayette Dragon, MD;  Location: [facility];  Service: Endoscopy;  Laterality: N/A;   LAMINECTOMY N/A 07/28/2018   Procedure: THORACIC ELEVEN- THORACIC TWELVE POSTERIOR DECOMPRESSION LAMINECTOMY;  Surgeon: Judith Part, MD;  Location: [facility];  Service: Neurosurgery;  Laterality: N/A;   LUMBAR LAMINECTOMY     NASAL POLYP SURGERY     ORIF ANKLE FRACTURE Right  07/01/2020   Procedure: OPEN REDUCTION INTERNAL FIXATION (ORIF) ANKLE FRACTURE. TRIMALLEOLAR;  Surgeon: Marchia Bond, MD;  Location: [facility];  Service: Orthopedics;  Laterality: Right;   TONSILLECTOMY         Family History  Problem Relation Age of Onset   Diabetes Mother    Asthma Mother    Hypertension Father    Stroke Father    Colon cancer Neg Hx     Social History   Tobacco Use   Smoking status: Former    Packs/day: 1.00    Years: 25.00    Pack years: 25.00    Types: Cigarettes    Quit date: 1995    Years since quitting: 27.8   Smokeless tobacco: Never  Vaping Use   Vaping Use: Never used  Substance Use Topics   Alcohol use: Yes    Comment: holidays and special occasions   Drug use: No    Home Medications Prior to Admission medications   Medication Sig Start Date End Date Taking? Authorizing Provider  aluminum-magnesium hydroxide-simethicone (MAALOX) [phone] MG/5ML SUSP Take 15-30 mLs by mouth 3 (three) times daily as needed (for heartburn or indigestion).    [provider]  atenolol-chlorthalidone (TENORETIC) 50-25 MG tablet Take 1 tablet by mouth daily. 12/21/20   [name], [name], MD  cloNIDine (CATAPRES) 0.1 MG tablet TAKE 1 TABLET (0.1 MG TOTAL) BY MOUTH DAILY. 12/21/20   [name], [name], MD  DULoxetine (CYMBALTA) 30 MG capsule Take 2 capsules (60 mg total) by mouth daily. 07/19/20   Angiulli, Lavon Paganini, PA-C  DULoxetine (CYMBALTA) 60 MG capsule TAKE 1 CAPSULE (60 MG TOTAL) BY MOUTH DAILY. 07/19/20 07/19/21  Angiulli, Lavon Paganini, PA-C  metFORMIN (GLUCOPHAGE) 500 MG tablet *NEED OFFICE VISIT* TAKE 0.5 TABLETS (250 MG TOTAL) BY MOUTH DAILY WITH BREAKFAST. 07/28/21   [name], [name], MD  Multiple Vitamin (MULITIVITAMIN WITH MINERALS) TABS Take 1 tablet by mouth daily with breakfast.    [provider]  oxyCODONE-acetaminophen (PERCOCET) 7.5-325 MG tablet Take 2 tablets by mouth See admin instructions. Take 2 (TWO) tablets by mouth in the morning at 7  AM and in the evening at 6 PM 10/20/20   [provider]  polyethylene glycol (MIRALAX / GLYCOLAX) 17 g packet Take 17 g by mouth 2 (two) times daily. 07/19/20   Angiulli, Lavon Paganini, PA-C  potassium chloride SA (KLOR-CON) 20 MEQ tablet TAKE 1 TABLET (20 MEQ TOTAL) BY MOUTH DAILY. 07/19/20 07/19/21  Angiulli, Lavon Paganini, PA-C  simvastatin (ZOCOR) 40 MG tablet TAKE 1 TABLET BY MOUTH EVERY DAY 12/21/20   [name], [name], MD  apixaban (ELIQUIS) 5 MG TABS tablet Take 1 tablet (5 mg total) by mouth 2 (two) times daily. Patient not taking: No sig reported 07/19/20 11/24/20  Angiulli, Lavon Paganini, PA-C    Allergies    Aspirin and Lisinopril  Review of Systems   Review of Systems  Constitutional:  Negative for fever.  Eyes:  Negative for visual disturbance.  Respiratory:  Negative for shortness of breath.  Cardiovascular:  Negative for chest pain.  Gastrointestinal:  Negative for abdominal pain.  Genitourinary:  Negative for difficulty urinating and dysuria.       Denies urinary and fecal incontinence  Musculoskeletal:  Positive for extremity weakness.       Mild back pain  Neurological:  Positive for weakness. Negative for speech difficulty and headaches.  All other systems reviewed and are negative.  Physical Exam Updated Vital Signs BP 115/79   Pulse 67   Temp 98.9 F (37.2 C) (Oral)   Resp 14   SpO2 97%   Physical Exam CONSTITUTIONAL: Well developed/well nourished HEAD: Normocephalic/atraumatic EYES: EOMI/PERRL, no nystagmus ENMT: Mucous membranes moist NECK: supple no meningeal signs SPINE/BACK:entire spine nontender previous surgical scar noted CV: S1/S2 noted, no murmurs/rubs/gallops noted LUNGS: Lungs are clear to auscultation bilaterally, no apparent distress ABDOMEN: soft, nontender, no rebound or guarding GU:no cva tenderness NEURO: Awake/alert,Cranial nerves 3/4/5/6/04/15/09/11/12 tested and intact Equal 5/5 strength with shoulder abduction, elbow flex/extension, wrist  flex/extension in upper extremities and equal hand grips bilaterally No obvious sensory deficit to LE Pt is unable to flex either hip, or flex/extend either knee Great toe extension is intact bilaterally.  appropriate motor is noted with ankle dorsi and plantar flexion.  No obvious clonus but limited on left foot due to orthopedic issues.  Bilateral patellar reflexes intact EXTREMITIES: pulses normal in both feet, full ROM, brace to left foot SKIN: warm, color normal PSYCH: no abnormalities of mood noted, alert and oriented to situation  ED Results / Procedures / Treatments   Labs (all labs ordered are listed, but only abnormal results are displayed) Labs Reviewed  BASIC METABOLIC PANEL - Abnormal; Notable for the following components:      Result Value   Glucose, Bld 120 (*)    Creatinine, Ser 1.55 (*)    GFR, Estimated 48 (*)    All other components within normal limits  CBC - Abnormal; Notable for the following components:   RBC 4.11 (*)    Hemoglobin 12.4 (*)    All other components within normal limits  RESP PANEL BY RT-PCR (FLU A&B, COVID) ARPGX2  APTT  PROTIME-INR    EKG EKG Interpretation  Date/Time:  Monday August 07 2021 13:22:45 EDT Ventricular Rate:  70 PR Interval:  224 QRS Duration: 94 QT Interval:  396 QTC Calculation: 427 R Axis:   -54 Text Interpretation: Sinus rhythm with 1st degree A-V block Left anterior fascicular block Minimal voltage criteria for LVH, may be normal variant ( R in aVL ) Possible Lateral infarct , age undetermined Abnormal ECG Confirmed by Ripley Fraise [phone]) on 08/08/2021 12:22:53 AM  Radiology MR LUMBAR SPINE WO CONTRAST  Result Date: 08/07/2021 CLINICAL DATA:  Low back pain, prior surgery, new symptoms EXAM: MRI LUMBAR SPINE WITHOUT CONTRAST TECHNIQUE: Multiplanar, multisequence MR imaging of the lumbar spine was performed. No intravenous contrast was administered. COMPARISON:  06/01/2020. FINDINGS: Segmentation: 5 lumbar type  vertebral bodies. Same numbering as on the prior MRI. Alignment: Prominent levocurvature. Grade 1 anterolisthesis at L4-L5, similar. Vertebrae: Mild degenerative/discogenic endplate signal changes at multiple levels. Otherwise, no focal edema to suggest acute fracture or discitis/osteomyelitis. Conus medullaris and cauda equina: Conus extends to the L1-L2 level. Conus appears normal. Paraspinal and other soft tissues: Postoperative changes in the posterior paraspinal soft tissues. Partially imaged right renal cysts. Disc levels: T12-L1: Facet arthropathy and mild disc bulging. L1-L2: Right eccentric disc height loss. Asymmetric right facet arthropathy. Moderate right foraminal stenosis and mild right subarticular  recess narrowing, similar. L2-L3: Right eccentric disc space narrowing and disc bulge/endplate spurring. Asymmetric right facet arthropathy. Moderate right and mild left foraminal stenosis with mild canal stenosis, similar. L3-L4: Right eccentric disc height loss. Disc bulging with severe bilateral facet arthropathy. Bulky ligamentum flavum thickening severe canal stenosis and severe bilateral foraminal stenosis, similar. L4-L5: Grade 1 anterolisthesis of L4 on L5 severe bilateral facet arthropathy with bilateral facet joint effusions, mildly progressed. Disc bulging and ligamentum flavum thickening. Moderate right and severe left foraminal stenosis, similar. Progressive severe left subarticular recess stenosis. Similar canal stenosis. L5-S1: Asymmetric left-sided facet arthropathy with similar left foraminal impingement specially at the entry to the foramen. Patent canal. IMPRESSION: 1. Severe multilevel degenerative change with levoscoliosis. 2. At L3-L4, similar severe canal stenosis. 3. At L4-L5, progressive severe left subarticular recess stenosis. 4. Similar multilevel severe foraminal stenosis/impingement. Electronically Signed   By: Margaretha Sheffield M.D.   On: 08/07/2021 17:29     Procedures Procedures   Medications Ordered in ED Medications  oxyCODONE-acetaminophen (PERCOCET) 7.5-325 MG per tablet 1 tablet (has no administration in time range)    ED Course  I have reviewed the triage vital signs and the nursing notes.  Pertinent labs & imaging results that were available during my care of the patient were reviewed by me and considered in my medical decision making (see chart for details).    MDM Rules/Calculators/A&P                           Patient presents with bilateral lower extremity weakness that has been worsening the past several weeks.  MRI shows multiple findings including spinal stenosis. I have consulted neurosurgery. 1:38 AM Discussed the case with Dr. Venetia Constable on-call for neurosurgery.  I discussed the patient's symptoms which includes progressive weakness over the past several weeks since spinal injection. I also discussed with him the MRI result No obvious signs of infectious etiology on MRI.  Patient denies any fevers or chills and has no worsening pain Patient confirmed that this is not acute in the past 24 hours but worse over the past several weeks Dr. Venetia Constable will admit Patient updated on plan.  Neurosurgery does not want any other MRIs at this time  Final Clinical Impression(s) / ED Diagnoses Final diagnoses:  Weakness  Spinal stenosis of lumbar region, unspecified whether neurogenic claudication present    Rx / DC Orders ED Discharge Orders     None        Ripley Fraise, MD 08/08/21 0140

## 2021-08-08 NOTE — ED Notes (Signed)
Attempted to call report x 1 after completing care handoff process

## 2021-08-09 ENCOUNTER — Observation Stay (HOSPITAL_COMMUNITY): Payer: Medicare Other

## 2021-08-09 ENCOUNTER — Encounter (HOSPITAL_COMMUNITY)
Admission: EM | Disposition: A | Discharge status: Rehabilitation Facility | Payer: Self-pay | Source: Home / Self Care | Attending: Neurological Surgery

## 2021-08-09 ENCOUNTER — Encounter (HOSPITAL_COMMUNITY): Payer: Self-pay | Admitting: Neurological Surgery

## 2021-08-09 ENCOUNTER — Observation Stay (HOSPITAL_COMMUNITY): Payer: Medicare Other | Admitting: Certified Registered Nurse Anesthetist

## 2021-08-09 DIAGNOSIS — Z7901 Long term (current) use of anticoagulants: Secondary | ICD-10-CM | POA: Diagnosis not present

## 2021-08-09 DIAGNOSIS — Z981 Arthrodesis status: Secondary | ICD-10-CM | POA: Diagnosis not present

## 2021-08-09 DIAGNOSIS — Z66 Do not resuscitate: Secondary | ICD-10-CM | POA: Diagnosis not present

## 2021-08-09 DIAGNOSIS — G894 Chronic pain syndrome: Secondary | ICD-10-CM | POA: Diagnosis not present

## 2021-08-09 DIAGNOSIS — Z79891 Long term (current) use of opiate analgesic: Secondary | ICD-10-CM | POA: Diagnosis not present

## 2021-08-09 DIAGNOSIS — Z86711 Personal history of pulmonary embolism: Secondary | ICD-10-CM | POA: Diagnosis not present

## 2021-08-09 DIAGNOSIS — G9520 Unspecified cord compression: Secondary | ICD-10-CM | POA: Diagnosis not present

## 2021-08-09 DIAGNOSIS — E1169 Type 2 diabetes mellitus with other specified complication: Secondary | ICD-10-CM | POA: Diagnosis not present

## 2021-08-09 DIAGNOSIS — E785 Hyperlipidemia, unspecified: Secondary | ICD-10-CM | POA: Diagnosis not present

## 2021-08-09 DIAGNOSIS — M4154 Other secondary scoliosis, thoracic region: Secondary | ICD-10-CM | POA: Diagnosis not present

## 2021-08-09 DIAGNOSIS — E1142 Type 2 diabetes mellitus with diabetic polyneuropathy: Secondary | ICD-10-CM | POA: Diagnosis not present

## 2021-08-09 DIAGNOSIS — M4325 Fusion of spine, thoracolumbar region: Secondary | ICD-10-CM | POA: Diagnosis not present

## 2021-08-09 DIAGNOSIS — J301 Allergic rhinitis due to pollen: Secondary | ICD-10-CM | POA: Diagnosis not present

## 2021-08-09 DIAGNOSIS — D62 Acute posthemorrhagic anemia: Secondary | ICD-10-CM | POA: Diagnosis not present

## 2021-08-09 DIAGNOSIS — M1990 Unspecified osteoarthritis, unspecified site: Secondary | ICD-10-CM | POA: Diagnosis not present

## 2021-08-09 DIAGNOSIS — M4714 Other spondylosis with myelopathy, thoracic region: Secondary | ICD-10-CM | POA: Diagnosis not present

## 2021-08-09 DIAGNOSIS — N179 Acute kidney failure, unspecified: Secondary | ICD-10-CM | POA: Diagnosis not present

## 2021-08-09 DIAGNOSIS — R531 Weakness: Secondary | ICD-10-CM | POA: Diagnosis not present

## 2021-08-09 DIAGNOSIS — E1165 Type 2 diabetes mellitus with hyperglycemia: Secondary | ICD-10-CM | POA: Diagnosis not present

## 2021-08-09 DIAGNOSIS — Z87891 Personal history of nicotine dependence: Secondary | ICD-10-CM | POA: Diagnosis not present

## 2021-08-09 DIAGNOSIS — M4804 Spinal stenosis, thoracic region: Secondary | ICD-10-CM | POA: Diagnosis present

## 2021-08-09 DIAGNOSIS — Z20822 Contact with and (suspected) exposure to covid-19: Secondary | ICD-10-CM | POA: Diagnosis not present

## 2021-08-09 DIAGNOSIS — Z886 Allergy status to analgesic agent status: Secondary | ICD-10-CM | POA: Diagnosis not present

## 2021-08-09 DIAGNOSIS — G9589 Other specified diseases of spinal cord: Secondary | ICD-10-CM | POA: Diagnosis not present

## 2021-08-09 DIAGNOSIS — Z888 Allergy status to other drugs, medicaments and biological substances status: Secondary | ICD-10-CM | POA: Diagnosis not present

## 2021-08-09 DIAGNOSIS — I10 Essential (primary) hypertension: Secondary | ICD-10-CM | POA: Diagnosis not present

## 2021-08-09 DIAGNOSIS — Z79899 Other long term (current) drug therapy: Secondary | ICD-10-CM | POA: Diagnosis not present

## 2021-08-09 DIAGNOSIS — Z7984 Long term (current) use of oral hypoglycemic drugs: Secondary | ICD-10-CM | POA: Diagnosis not present

## 2021-08-09 LAB — SURGICAL PCR SCREEN
MRSA, PCR: NEGATIVE
Staphylococcus aureus: NEGATIVE

## 2021-08-09 LAB — TYPE AND SCREEN
ABO/RH(D): O POS
Antibody Screen: NEGATIVE

## 2021-08-09 LAB — GLUCOSE, CAPILLARY
Glucose-Capillary: 126 mg/dL — ABNORMAL HIGH (ref 70–99)
Glucose-Capillary: 160 mg/dL — ABNORMAL HIGH (ref 70–99)

## 2021-08-09 SURGERY — POSTERIOR LUMBAR FUSION 1 LEVEL
Anesthesia: General | Site: Back

## 2021-08-09 MED ORDER — ALBUMIN HUMAN 5 % IV SOLN: INTRAVENOUS | Status: DC | PRN

## 2021-08-09 MED ORDER — BUPIVACAINE HCL (PF) 0.5 % IJ SOLN
INTRAMUSCULAR | Status: AC
  Filled 2021-08-09: qty 30

## 2021-08-09 MED ORDER — LIDOCAINE 2% (20 MG/ML) 5 ML SYRINGE
INTRAMUSCULAR | Status: DC | PRN
  Administered 2021-08-09: 100 mg via INTRAVENOUS

## 2021-08-09 MED ORDER — ROCURONIUM BROMIDE 10 MG/ML (PF) SYRINGE
PREFILLED_SYRINGE | INTRAVENOUS | Status: DC | PRN
  Administered 2021-08-09: 50 mg via INTRAVENOUS
  Administered 2021-08-09 (×2): 30 mg via INTRAVENOUS
  Administered 2021-08-09: 20 mg via INTRAVENOUS

## 2021-08-09 MED ORDER — ONDANSETRON HCL 4 MG/2ML IJ SOLN
INTRAMUSCULAR | Status: DC | PRN
  Administered 2021-08-09: 4 mg via INTRAVENOUS

## 2021-08-09 MED ORDER — THROMBIN 5000 UNITS EX SOLR
CUTANEOUS | Status: AC
  Filled 2021-08-09: qty 5000

## 2021-08-09 MED ORDER — ONDANSETRON HCL 4 MG/2ML IJ SOLN
INTRAMUSCULAR | Status: AC
  Filled 2021-08-09: qty 2

## 2021-08-09 MED ORDER — CHLORHEXIDINE GLUCONATE 0.12 % MT SOLN
OROMUCOSAL | Status: AC
  Administered 2021-08-09: 15 mL via OROMUCOSAL
  Filled 2021-08-09: qty 15

## 2021-08-09 MED ORDER — ARTIFICIAL TEARS OPHTHALMIC OINT
TOPICAL_OINTMENT | OPHTHALMIC | Status: AC
  Filled 2021-08-09: qty 3.5

## 2021-08-09 MED ORDER — EPHEDRINE 5 MG/ML INJ
INTRAVENOUS | Status: AC
  Filled 2021-08-09: qty 5

## 2021-08-09 MED ORDER — VASOPRESSIN 20 UNIT/ML IV SOLN
INTRAVENOUS | Status: AC
  Filled 2021-08-09: qty 1

## 2021-08-09 MED ORDER — MIDAZOLAM HCL 2 MG/2ML IJ SOLN
INTRAMUSCULAR | Status: DC | PRN
Start: 2021-08-09 — End: 2021-08-09
  Administered 2021-08-09: 2 mg via INTRAVENOUS

## 2021-08-09 MED ORDER — 0.9 % SODIUM CHLORIDE (POUR BTL) OPTIME
TOPICAL | Status: DC | PRN
  Administered 2021-08-09: 1000 mL

## 2021-08-09 MED ORDER — MIDAZOLAM HCL 2 MG/2ML IJ SOLN
INTRAMUSCULAR | Status: AC
  Filled 2021-08-09: qty 2

## 2021-08-09 MED ORDER — FENTANYL CITRATE (PF) 100 MCG/2ML IJ SOLN
25.0000 ug | INTRAMUSCULAR | Status: DC | PRN
  Administered 2021-08-09 (×2): 50 ug via INTRAVENOUS

## 2021-08-09 MED ORDER — PHENYLEPHRINE 40 MCG/ML (10ML) SYRINGE FOR IV PUSH (FOR BLOOD PRESSURE SUPPORT)
PREFILLED_SYRINGE | INTRAVENOUS | Status: DC | PRN
  Administered 2021-08-09: 80 ug via INTRAVENOUS
  Administered 2021-08-09: 40 ug via INTRAVENOUS

## 2021-08-09 MED ORDER — SUGAMMADEX SODIUM 200 MG/2ML IV SOLN
INTRAVENOUS | Status: DC | PRN
  Administered 2021-08-09: 300 mg via INTRAVENOUS

## 2021-08-09 MED ORDER — GADOBUTROL 1 MMOL/ML IV SOLN
10.0000 mL | Freq: Once | INTRAVENOUS | Status: AC | PRN
  Administered 2021-08-09: 10 mL via INTRAVENOUS

## 2021-08-09 MED ORDER — PROPOFOL 10 MG/ML IV BOLUS
INTRAVENOUS | Status: DC | PRN
  Administered 2021-08-09: 50 mg via INTRAVENOUS
  Administered 2021-08-09: 150 mg via INTRAVENOUS

## 2021-08-09 MED ORDER — THROMBIN 5000 UNITS EX SOLR
OROMUCOSAL | Status: DC | PRN
  Administered 2021-08-09: 5 mL via TOPICAL

## 2021-08-09 MED ORDER — SUCCINYLCHOLINE CHLORIDE 200 MG/10ML IV SOSY
PREFILLED_SYRINGE | INTRAVENOUS | Status: AC
  Filled 2021-08-09: qty 10

## 2021-08-09 MED ORDER — FENTANYL CITRATE (PF) 100 MCG/2ML IJ SOLN
INTRAMUSCULAR | Status: DC | PRN
  Administered 2021-08-09: 25 ug via INTRAVENOUS
  Administered 2021-08-09: 100 ug via INTRAVENOUS
  Administered 2021-08-09: 25 ug via INTRAVENOUS
  Administered 2021-08-09 (×5): 50 ug via INTRAVENOUS

## 2021-08-09 MED ORDER — PHENYLEPHRINE 40 MCG/ML (10ML) SYRINGE FOR IV PUSH (FOR BLOOD PRESSURE SUPPORT)
PREFILLED_SYRINGE | INTRAVENOUS | Status: AC
  Filled 2021-08-09: qty 10

## 2021-08-09 MED ORDER — PROPOFOL 10 MG/ML IV BOLUS
INTRAVENOUS | Status: AC
  Filled 2021-08-09: qty 20

## 2021-08-09 MED ORDER — DEXAMETHASONE SODIUM PHOSPHATE 10 MG/ML IJ SOLN
INTRAMUSCULAR | Status: DC | PRN
  Administered 2021-08-09: 10 mg via INTRAVENOUS

## 2021-08-09 MED ORDER — FENTANYL CITRATE (PF) 250 MCG/5ML IJ SOLN
INTRAMUSCULAR | Status: AC
  Filled 2021-08-09: qty 5

## 2021-08-09 MED ORDER — SUGAMMADEX SODIUM 500 MG/5ML IV SOLN
INTRAVENOUS | Status: AC
  Filled 2021-08-09: qty 5

## 2021-08-09 MED ORDER — CHLORHEXIDINE GLUCONATE 0.12 % MT SOLN: 15.0000 mL | Freq: Once | OROMUCOSAL | Status: AC

## 2021-08-09 MED ORDER — LIDOCAINE-EPINEPHRINE 1 %-1:100000 IJ SOLN
INTRAMUSCULAR | Status: AC
  Filled 2021-08-09: qty 1

## 2021-08-09 MED ORDER — BUPIVACAINE HCL (PF) 0.5 % IJ SOLN
INTRAMUSCULAR | Status: DC | PRN
  Administered 2021-08-09: 5 mL

## 2021-08-09 MED ORDER — ORAL CARE MOUTH RINSE: 15.0000 mL | Freq: Once | OROMUCOSAL | Status: AC

## 2021-08-09 MED ORDER — CHLORHEXIDINE GLUCONATE CLOTH 2 % EX PADS
6.0000 | MEDICATED_PAD | Freq: Every day | CUTANEOUS | Status: DC
  Administered 2021-08-10 – 2021-08-15 (×5): 6 via TOPICAL

## 2021-08-09 MED ORDER — LACTATED RINGERS IV SOLN: INTRAVENOUS | Status: DC

## 2021-08-09 MED ORDER — LIDOCAINE-EPINEPHRINE 1 %-1:100000 IJ SOLN
INTRAMUSCULAR | Status: DC | PRN
  Administered 2021-08-09: 5 mL via INTRADERMAL

## 2021-08-09 MED ORDER — FENTANYL CITRATE (PF) 100 MCG/2ML IJ SOLN
INTRAMUSCULAR | Status: AC
  Filled 2021-08-09: qty 2

## 2021-08-09 MED ORDER — SUCCINYLCHOLINE CHLORIDE 200 MG/10ML IV SOSY
PREFILLED_SYRINGE | INTRAVENOUS | Status: DC | PRN
Start: 2021-08-09 — End: 2021-08-09
  Administered 2021-08-09: 100 mg via INTRAVENOUS

## 2021-08-09 MED ORDER — LACTATED RINGERS IV SOLN: INTRAVENOUS | Status: DC | PRN

## 2021-08-09 MED ORDER — EPHEDRINE SULFATE-NACL 50-0.9 MG/10ML-% IV SOSY
PREFILLED_SYRINGE | INTRAVENOUS | Status: DC | PRN
  Administered 2021-08-09: 5 mg via INTRAVENOUS

## 2021-08-09 MED ORDER — CEFAZOLIN IN SODIUM CHLORIDE 3-0.9 GM/100ML-% IV SOLN
3.0000 g | INTRAVENOUS | Status: AC
  Administered 2021-08-09: 3 g via INTRAVENOUS
  Filled 2021-08-09 (×2): qty 100

## 2021-08-09 MED ORDER — ROCURONIUM BROMIDE 10 MG/ML (PF) SYRINGE
PREFILLED_SYRINGE | INTRAVENOUS | Status: AC
  Filled 2021-08-09: qty 10

## 2021-08-09 SURGICAL SUPPLY — 63 items
ADH SKN CLS APL DERMABOND .7 (GAUZE/BANDAGES/DRESSINGS) ×1
APL SKNCLS STERI-STRIP NONHPOA (GAUZE/BANDAGES/DRESSINGS)
BAG COUNTER SPONGE SURGICOUNT (BAG) ×2 IMPLANT
BAG SPNG CNTER NS LX DISP (BAG) ×1
BASKET BONE COLLECTION (BASKET) ×2 IMPLANT
BENZOIN TINCTURE PRP APPL 2/3 (GAUZE/BANDAGES/DRESSINGS) IMPLANT
BLADE CLIPPER SURG (BLADE) IMPLANT
BLADE SURG 11 STRL SS (BLADE) ×2 IMPLANT
BUR MATCHSTICK NEURO 3.0 LAGG (BURR) ×2 IMPLANT
BUR PRECISION FLUTE 5.0 (BURR) ×2 IMPLANT
CANISTER SUCT 3000ML PPV (MISCELLANEOUS) ×2 IMPLANT
CNTNR URN SCR LID CUP LEK RST (MISCELLANEOUS) ×1 IMPLANT
CONT SPEC 4OZ STRL OR WHT (MISCELLANEOUS) ×2
COVER BACK TABLE 60X90IN (DRAPES) ×2 IMPLANT
DECANTER SPIKE VIAL GLASS SM (MISCELLANEOUS) ×2 IMPLANT
DERMABOND ADVANCED (GAUZE/BANDAGES/DRESSINGS) ×1
DERMABOND ADVANCED .7 DNX12 (GAUZE/BANDAGES/DRESSINGS) ×1 IMPLANT
DRAPE C-ARM 42X72 X-RAY (DRAPES) IMPLANT
DRAPE C-ARMOR (DRAPES) IMPLANT
DRAPE LAPAROTOMY 100X72X124 (DRAPES) ×2 IMPLANT
DRAPE SURG 17X23 STRL (DRAPES) ×2 IMPLANT
DURAPREP 26ML APPLICATOR (WOUND CARE) ×2 IMPLANT
ELECT REM PT RETURN 9FT ADLT (ELECTROSURGICAL) ×2
ELECTRODE REM PT RTRN 9FT ADLT (ELECTROSURGICAL) ×1 IMPLANT
GAUZE 4X4 16PLY ~~LOC~~+RFID DBL (SPONGE) ×2 IMPLANT
GAUZE SPONGE 4X4 12PLY STRL (GAUZE/BANDAGES/DRESSINGS) IMPLANT
GLOVE EXAM NITRILE LRG STRL (GLOVE) IMPLANT
GLOVE EXAM NITRILE XL STR (GLOVE) IMPLANT
GLOVE EXAM NITRILE XS STR PU (GLOVE) IMPLANT
GLOVE SURG LTX SZ7.5 (GLOVE) ×4 IMPLANT
GLOVE SURG UNDER POLY LF SZ7.5 (GLOVE) ×4 IMPLANT
GOWN STRL REUS W/ TWL LRG LVL3 (GOWN DISPOSABLE) ×4 IMPLANT
GOWN STRL REUS W/ TWL XL LVL3 (GOWN DISPOSABLE) IMPLANT
GOWN STRL REUS W/TWL 2XL LVL3 (GOWN DISPOSABLE) IMPLANT
GOWN STRL REUS W/TWL LRG LVL3 (GOWN DISPOSABLE) ×8
GOWN STRL REUS W/TWL XL LVL3 (GOWN DISPOSABLE)
HEMOSTAT POWDER KIT SURGIFOAM (HEMOSTASIS) ×2 IMPLANT
KIT BASIN OR (CUSTOM PROCEDURE TRAY) ×2 IMPLANT
KIT POSITION SURG JACKSON T1 (MISCELLANEOUS) ×2 IMPLANT
KIT TURNOVER KIT B (KITS) ×2 IMPLANT
MILL MEDIUM DISP (BLADE) ×2 IMPLANT
NEEDLE HYPO 18GX1.5 BLUNT FILL (NEEDLE) IMPLANT
NEEDLE HYPO 22GX1.5 SAFETY (NEEDLE) ×2 IMPLANT
NEEDLE SPNL 18GX3.5 QUINCKE PK (NEEDLE) IMPLANT
NS IRRIG 1000ML POUR BTL (IV SOLUTION) ×2 IMPLANT
PACK LAMINECTOMY NEURO (CUSTOM PROCEDURE TRAY) ×2 IMPLANT
PAD ARMBOARD 7.5X6 YLW CONV (MISCELLANEOUS) ×6 IMPLANT
ROD 40MM SOLERA PREBENT (Rod) ×4 IMPLANT
SCREW 6.5X40 (Screw) ×8 IMPLANT
SCREW SET SOLERA (Screw) ×8 IMPLANT
SCREW SET SOLERA TI5.5 (Screw) ×4 IMPLANT
SPONGE SURGIFOAM ABS GEL 100 (HEMOSTASIS) IMPLANT
SPONGE T-LAP 4X18 ~~LOC~~+RFID (SPONGE) ×4 IMPLANT
STRIP CLOSURE SKIN 1/2X4 (GAUZE/BANDAGES/DRESSINGS) IMPLANT
SUT MNCRL AB 3-0 PS2 18 (SUTURE) ×2 IMPLANT
SUT VIC AB 0 CT1 18XCR BRD8 (SUTURE) ×1 IMPLANT
SUT VIC AB 0 CT1 8-18 (SUTURE) ×2
SUT VIC AB 2-0 CP2 18 (SUTURE) ×4 IMPLANT
SYR 3ML LL SCALE MARK (SYRINGE) IMPLANT
TOWEL GREEN STERILE (TOWEL DISPOSABLE) ×2 IMPLANT
TOWEL GREEN STERILE FF (TOWEL DISPOSABLE) ×2 IMPLANT
TRAY FOLEY MTR SLVR 16FR STAT (SET/KITS/TRAYS/PACK) ×2 IMPLANT
WATER STERILE IRR 1000ML POUR (IV SOLUTION) ×2 IMPLANT

## 2021-08-09 NOTE — Anesthesia Preprocedure Evaluation (Addendum)
Anesthesia Evaluation  Patient identified by MRN, date of birth, ID band Patient awake    Reviewed: Allergy & Precautions, NPO status , Patient's Chart, lab work & pertinent test results  Airway Mallampati: II  TM Distance: >3 FB     Dental   Pulmonary pneumonia, former smoker, PE (09/21)   breath sounds clear to auscultation       Cardiovascular hypertension, Pt. on medications and Pt. on home beta blockers  Rhythm:Regular Rate:Normal     Neuro/Psych  Neuromuscular disease negative psych ROS   GI/Hepatic Neg liver ROS, hiatal hernia, PUD,   Endo/Other  diabetes, Type 2  Renal/GU Renal disease  negative genitourinary   Musculoskeletal  (+) Arthritis , Osteoarthritis,  Thoracic myelopathy    Abdominal   Peds  Hematology  (+) anemia ,   Anesthesia Other Findings   Reproductive/Obstetrics                            Anesthesia Physical Anesthesia Plan  ASA: 3  Anesthesia Plan: General   Post-op Pain Management:    Induction: Intravenous  PONV Risk Score and Plan: 2 and Ondansetron, Dexamethasone and Treatment may vary due to age or medical condition  Airway Management Planned: Mask and Oral ETT  Additional Equipment: None  Intra-op Plan:   Post-operative Plan: Extubation in OR  Informed Consent: I have reviewed the patients History and Physical, chart, labs and discussed the procedure including the risks, benefits and alternatives for the proposed anesthesia with the patient or authorized representative who has indicated his/her understanding and acceptance.     Dental advisory given  Plan Discussed with:   Anesthesia Plan Comments: (Lab Results      Component                Value               Date                      WBC                      7.2                 08/07/2021                HGB                      12.4 (L)            08/07/2021                HCT                       39.3                08/07/2021                MCV                      95.6                08/07/2021                PLT                      316  08/07/2021           Lab Results      Component                Value               Date                      NA                       143                 08/07/2021                K                        4.1                 08/07/2021                CO2                      27                  08/07/2021                GLUCOSE                  120 (H)             08/07/2021                BUN                      23                  08/07/2021                CREATININE               1.55 (H)            08/07/2021                CALCIUM                  10.2                08/07/2021                GFRNONAA                 48 (L)              08/07/2021          )       Anesthesia Quick Evaluation

## 2021-08-09 NOTE — Op Note (Signed)
PATIENT: Keith [name]  DAY OF SURGERY: 08/09/21   PRE-OPERATIVE DIAGNOSIS:  Thoracic myelopathy   POST-OPERATIVE DIAGNOSIS:  Same   PROCEDURE:  T11-12 laminectomy, right T11-12 facetectomy, T11-T12 posterior spinal instrumented fusion with pedicle screw instrumentation and posterolateral fusion   SURGEON:  Surgeon(s) and Role:    Judith Part, MD - Primary   ANESTHESIA: ETGA   BRIEF HISTORY: This is a 70 year old man in whom I previously performed a decompression at T11-12 three years ago for thoracic myelopathy. He presented with recurrent symptoms and MRI showed recurrence of his stenosis with worsening T2 signal change in the cord. I therefore recommended repeat decompression. Given his recurrence and the large facetectomy required for decompression, I also recommended PSIF to try and prevent another recurrence or further instability. We discussed risks, benefits, and alternatives at length. Aside from the usual risks of this type of surgery, we also discussed his large deformity and that disruption or alteration of his biomechanics could cause progression of his curve as well as this surgery not addressing any symptoms he has from his degenerative scoliosis. He voiced understanding and wished to proceed with surgery.   OPERATIVE DETAIL: The patient was taken to the operating room and anesthesia was induced by the anesthesia team. They were placed on the OR table in the prone position with padding of all pressure points. A formal time out was performed with two patient identifiers and confirmed the operative site. The patient's prior thoracic incision was marked, hair was clipped with surgical clippers, the area was then prepped and draped in a sterile fashion. Fluoro was used to confirm localization of the operative level and a midline incision was placed to expose from T11 to T12. Subperiosteal dissection was performed bilaterally and fluoroscopy was again used to confirm the  surgical level.   There was significant scar tissue from his prior decompression, as expected. Decompression was performed with a combination of high speed drill and rongeurs, which consisted of T11 and T12 laminectomies and bilateral medial facetectomies. On the right, a complete T11-12 facetectomy was performed to complete the decompression.   Instrumentation was then performed. Fluoroscopy was used to guide placement of bilateral pedicle screws ([employer]) at T11 and T12. As expected, difficult views given his deformity. I therefore extended his decompression to expose the pedicles bilaterally and used fluoroscopy as needed for depth. Screws were placed by localizing the pedicle, drilling a pilot hole, cannulating the pedicle with an awl, palpating for pedicle wall breaches, tapping, palpating, and then placing the screw. These were connected with rods bilaterally and final tightened according to manufacturer torque specifications. The bone was thoroughly decorticated over the fusion surface, hemostasis was confirmed and the wound was copiously irrigated, and the previously resected bone fragments were morselized and used as autograft.   All instrument and sponge counts were correct, the incision was then closed in layers. The patient was then returned to anesthesia for emergence. No apparent complications at the completion of the procedure.   EBL:  347mL   DRAINS: none   SPECIMENS: none   Judith Part, MD 08/09/21 6:25 PM

## 2021-08-09 NOTE — TOC Initial Note (Signed)
Transition of Care [facility]) - Initial/Assessment Note    Patient Details  Name: Keith [name] MRN: [id number] Date of Birth: 06/20/1951  Transition of [facility]) CM/SW Contact:    Pollie Friar, RN Phone Number: 08/09/2021, 11:48 AM  Clinical Narrative:                 Patient lives at home with his spouse that can provide needed supervision. Pt has all needed DME at home.  Pt denies issues with transportation or home medications.  Pt to have surgery today. Will follow for needs post surgery.   Expected Discharge Plan: Home/Self Care Barriers to Discharge: Continued Medical Work up   Patient Goals and CMS Choice        Expected Discharge Plan and Services Expected Discharge Plan: Home/Self Care   Discharge Planning Services: CM Consult   Living arrangements for the past 2 months: Single Family Home                                      Prior Living Arrangements/Services Living arrangements for the past 2 months: Single Family Home Lives with:: Spouse Patient language and need for interpreter reviewed:: Yes Do you feel safe going back to the place where you live?: Yes        Care giver support system in place?: Yes (comment) Current home services: DME (3 in 1/ wheelchair/ walker/ shower seat/ cane) Criminal Activity/Legal Involvement Pertinent to Current Situation/Hospitalization: No - Comment as needed  Activities of Daily Living      Permission Sought/Granted                  Emotional Assessment Appearance:: Appears stated age Attitude/Demeanor/Rapport: Engaged Affect (typically observed): Accepting Orientation: : Oriented to Self, Oriented to Place, Oriented to  Time, Oriented to Situation   Psych Involvement: No (comment)  Admission diagnosis:  Weakness [R53.1] Bilateral leg weakness [R29.898] Spinal stenosis of lumbar region, unspecified whether neurogenic claudication present [M48.061] Patient Active Problem List   Diagnosis Date  Noted   Bilateral leg weakness 08/08/2021   CAP (community acquired pneumonia) 11/19/2020   Loculated pleural effusion 11/19/2020   DNR (do not resuscitate) 11/19/2020   Closed right ankle fracture    Labile blood glucose    Hypokalemia    Drug induced constipation    Debility    Hyponatremia    Diabetic peripheral neuropathy (HCC)    Acute blood loss anemia    Chronic pain syndrome    Postoperative pain    Pulmonary emboli ([place]) 07/04/2020   Closed right trimalleolar fracture 07/02/2020   Acute respiratory failure with hypoxia (HCC)    Acute saddle pulmonary embolism with acute cor pulmonale ([place]) 06/26/2020   Lumbar spinal stenosis 06/01/2020   Spinal cord compression ([place]) 04/02/2020   Thoracic myelopathy 07/25/2018   Hyperlipidemia associated with type 2 diabetes mellitus ([place]) 11/14/2016   Type 2 diabetes mellitus with diabetic neuropathy, unspecified ([place]) 11/14/2016   Routine general medical examination at a health care facility 11/14/2015   Hiatal hernia 08/30/2014   AKI (acute kidney injury) ([place]) 04/10/2013   Allergic rhinitis 04/10/2013   Spinal stenosis of lumbar region 11/06/2012   Allergic rhinitis due to pollen 01/15/2011   Osteoarthritis 11/02/2008   HIP PAIN, LEFT 02/09/2008   Class 2 obesity due to excess calories with body mass index (BMI) of 36.0 to 36.9 in adult 11/07/2007  ERECTILE DYSFUNCTION, MILD 11/07/2007   Essential hypertension 11/07/2007   PCP:  [name], [name], MD Pharmacy:   [id number] - [place], [address] [place] [address] Phone: [phone] Fax: [phone]     Social Determinants of Health (SDOH) Interventions    Readmission Risk Interventions No flowsheet data found.

## 2021-08-09 NOTE — Care Management Obs Status (Signed)
[place] NOTIFICATION   Patient Details  Name: Keith [name] MRN: [id number] Date of Birth: [birth year]   Medicare Observation Status Notification Given:  Yes    Pollie Friar, RN 08/09/2021, 11:38 AM

## 2021-08-09 NOTE — Anesthesia Procedure Notes (Signed)
Procedure Name: Intubation Date/Time: 08/09/2021 7:06 PM Performed by: Clovis Cao, CRNA Pre-anesthesia Checklist: Patient identified, Emergency Drugs available, Suction available and Patient being monitored Patient Re-evaluated:Patient Re-evaluated prior to induction Oxygen Delivery Method: Circle system utilized Preoxygenation: Pre-oxygenation with 100% oxygen Induction Type: IV induction Ventilation: Mask ventilation without difficulty Laryngoscope Size: Miller and 2 Grade View: Grade I Tube type: Oral Tube size: 7.5 mm Number of attempts: 1 Airway Equipment and Method: Stylet and Oral airway Placement Confirmation: ETT inserted through vocal cords under direct vision, positive ETCO2 and breath sounds checked- equal and bilateral Secured at: 23 cm Tube secured with: Tape Dental Injury: Teeth and Oropharynx as per pre-operative assessment

## 2021-08-09 NOTE — Plan of Care (Signed)

## 2021-08-09 NOTE — Transfer of Care (Signed)
Immediate Anesthesia Transfer of Care Note  Patient: Keith [name]  Procedure(s) Performed: THORACIC ELEVEN - TWELVE DECOMPRESSION AND POSTERIOR INSTRUMENTED FUSION (Back)  Patient Location: PACU  Anesthesia Type:General  Level of Consciousness: drowsy  Airway & Oxygen Therapy: Patient Spontanous Breathing and Patient connected to face mask oxygen  Post-op Assessment: Report given to RN and Post -op Vital signs reviewed and stable  Post vital signs: Reviewed and stable  Last Vitals:  Vitals Value Taken Time  BP    Temp    Pulse 82 08/09/21 2225  Resp 10 08/09/21 2225  SpO2 100 % 08/09/21 2225  Vitals shown include unvalidated device data.  Last Pain:  Vitals:   08/09/21 1759  TempSrc: Oral  PainSc: 3          Complications: No notable events documented.

## 2021-08-09 NOTE — Anesthesia Postprocedure Evaluation (Signed)
Anesthesia Post Note  Patient: Keith [name]  Procedure(s) Performed: THORACIC ELEVEN - TWELVE DECOMPRESSION AND POSTERIOR INSTRUMENTED FUSION (Back)     Patient location during evaluation: PACU Anesthesia Type: General Level of consciousness: awake Pain management: pain level controlled Vital Signs Assessment: post-procedure vital signs reviewed and stable Respiratory status: spontaneous breathing Cardiovascular status: stable Postop Assessment: no apparent nausea or vomiting Anesthetic complications: no   No notable events documented.  Last Vitals:  Vitals:   08/09/21 2300 08/09/21 2311  BP: (!) 135/97 (!) 144/74  Pulse: 81 81  Resp: 13 19  Temp:  (!) 36.1 C  SpO2: 95% 96%    Last Pain:  Vitals:   08/09/21 2300  TempSrc:   PainSc: 4                  Laterrica Libman

## 2021-08-09 NOTE — Progress Notes (Signed)
Neurosurgery Service Progress Note  Subjective: No acute events overnight, stable leg weakness, no new b/b sx   Objective: Vitals:   08/08/21 2024 08/09/21 0006 08/09/21 0444 08/09/21 0742  BP: 116/66 118/70 121/74 117/72  Pulse: 77 74 70 86  Resp: 16 16 18 14   Temp: 98.1 F (36.7 C) 98.9 F (37.2 C) 98.1 F (36.7 C) 98.2 F (36.8 C)  TempSrc: Oral Oral Oral Oral  SpO2: 100% 98% 100% 97%  Weight:      Height:        Physical Exam: Strength 5/5 in BUE, BLE are diffusely 2/5 on the left and 3/5 on the right, increased reflexes at the left patella and normal on the right, +ankle clonus on left and not right, mild T12 sensory level  Assessment & Plan: 70 y.o. man w/ recurrent thoracic myelopathy in the setting of degenerative scoliosis, MRI with recurrent stenosis at T11-12 with worsening cord signal change.  -discussed w/ the pt, I recommend surgery in the form of a T11-12 repeat decompression and T11-12 PSIF, we discussed r/b/a and he would like to proceed, will put on the schedule for today, keep NPO  Judith Part  08/09/21 9:13 AM

## 2021-08-10 ENCOUNTER — Inpatient Hospital Stay (HOSPITAL_COMMUNITY): Payer: Medicare Other

## 2021-08-10 MED ORDER — HEPARIN SODIUM (PORCINE) 5000 UNIT/ML IJ SOLN
5000.0000 [IU] | Freq: Three times a day (TID) | INTRAMUSCULAR | Status: DC
  Administered 2021-08-11 – 2021-08-21 (×31): 5000 [IU] via SUBCUTANEOUS
  Filled 2021-08-10 (×31): qty 1

## 2021-08-10 NOTE — Progress Notes (Signed)
Neurosurgery Service Progress Note  Subjective: No acute events overnight, leg strength already subjectively improved, back pain as expected, had some radicular pain in PACU that has thankfully resolved   Objective: Vitals:   08/09/21 2347 08/10/21 0024 08/10/21 0401 08/10/21 0846  BP: 139/74 114/83 135/74 116/70  Pulse: 78 79 88 91  Resp: (!) 22  17 19   Temp: 98.6 F (37 C)  99 F (37.2 C) 99.8 F (37.7 C)  TempSrc: Oral  Oral Oral  SpO2: 98%  96% 96%  Weight:      Height:        Physical Exam: Strength 5/5 in BUE, BLE are diffusely 3/5 bilaterally, increased reflexes at the left patella and normal on the right, +ankle clonus on left and not right, mild T12 sensory level  Assessment & Plan: 70 y.o. man w/ recurrent thoracic myelopathy in the setting of degenerative scoliosis, MRI with recurrent stenosis at T11-12 with worsening cord signal change. 11/2 s/p T11-12 repeat decompression and PSIF. 11/3 XR T-spine hardware in place, difficult to evaluate in detail 2/2 habitus  -PT/OT, likely good CIR candidate given how far he's off his baseline -SQH POD2 -activity / diet as tolerated, no brace / restrictions needed  Judith Part  08/10/21 9:06 AM

## 2021-08-10 NOTE — Evaluation (Signed)
Occupational Therapy Evaluation Patient Details Name: Keith [name] MRN: [id number] DOB: [birth year] Today's Date: 08/10/2021   History of Present Illness 70 yo male presenting to ED on 11/1 with bilateral LE weakness. S/p T11-12 laminectomy, right T11-12 facetectomy, T11-T12 posterior spinal instrumented fusion with pedicle screw instrumentation and posterolateral fusion on 11/2. PMH including DM, HTN, obesity, PE, and scoliosis.   Clinical Impression   PTA, pt was living with his wife and needed occasional assistance for LB ADLs and was using RW for mobility due to recent BLE weakness. Pt currently requiring Mod A for UB ADLs, Max A for LB ADLs, and Mod A for functional transfer with RW. Pt presenting with decreased balance, strength, activity tolerance, and ROM impacting his occupational performance. Pt also with blood saturating his sheet (upon rolling); notified RN who assisted with linen change and placement of bandage. Pt will require further acute OT to facilitate safe dc and optimize functional performance. Recommend dc to CIR for intensive OT to optimize return to independence and increase safety.      Recommendations for follow up therapy are one component of a multi-disciplinary discharge planning process, led by the attending physician.  Recommendations may be updated based on patient status, additional functional criteria and insurance authorization.   Follow Up Recommendations  Acute inpatient rehab (3hours/day)    Assistance Recommended at Discharge Frequent or constant Supervision/Assistance  Functional Status Assessment  Patient has had a recent decline in their functional status and demonstrates the ability to make significant improvements in function in a reasonable and predictable amount of time.  Equipment Recommendations  None recommended by OT    Recommendations for Other Services PT consult     Precautions / Restrictions Precautions Precautions:  Back Precaution Booklet Issued: No Precaution Comments: Reviewed back precautions Required Braces or Orthoses: Spinal Brace Spinal Brace: Other (comment) Spinal Brace Comments: No brace per MD note      Mobility Bed Mobility Overal bed mobility: Needs Assistance Bed Mobility: Rolling;Sidelying to Sit;Sit to Sidelying Rolling: Mod assist Sidelying to sit: Mod assist     Sit to sidelying: Mod assist General bed mobility comments: Mod A to roll to R; providing education on best technique for log roll. Mod A for manage BLEs throughout    Transfers Overall transfer level: Needs assistance Equipment used: Rolling walker (2 wheels) Transfers: Sit to/from Stand Sit to Stand: Mod assist           General transfer comment: Mod A for power up. Noting weakness at BLEs      Balance Overall balance assessment: Needs assistance Sitting-balance support: Feet supported;Bilateral upper extremity supported Sitting balance-Leahy Scale: Poor     Standing balance support: Bilateral upper extremity supported;During functional activity Standing balance-Leahy Scale: Poor                             ADL either performed or assessed with clinical judgement   ADL Overall ADL's : Needs assistance/impaired Eating/Feeding: Set up;Sitting   Grooming: Set up;Sitting;Bed level   Upper Body Bathing: Moderate assistance;Sitting   Lower Body Bathing: Maximal assistance;Sit to/from stand   Upper Body Dressing : Moderate assistance;Sitting   Lower Body Dressing: Maximal assistance;Sit to/from stand Lower Body Dressing Details (indicate cue type and reason): don socks Toilet Transfer: Moderate assistance;Rolling walker (2 wheels) (sit<>stand and side steps towards HOB only)           Functional mobility during ADLs: Moderate assistance;Rolling walker (2  wheels) (sit<>stand and side steps towards HOB only) General ADL Comments: Pt presenting with decreased balance, strength ,ROM,  and activity toelrance due to significant pain     Vision Baseline Vision/History: 1 Wears glasses       Perception     Praxis      Pertinent Vitals/Pain Pain Assessment: Faces Faces Pain Scale: Hurts whole lot Pain Location: Back Pain Descriptors / Indicators: Discomfort;Grimacing;Constant;Guarding Pain Intervention(s): Limited activity within patient's tolerance;Premedicated before session;Monitored during session;Repositioned     Hand Dominance Right   Extremity/Trunk Assessment Upper Extremity Assessment Upper Extremity Assessment: Overall WFL for tasks assessed   Lower Extremity Assessment Lower Extremity Assessment: Defer to PT evaluation   Cervical / Trunk Assessment Cervical / Trunk Assessment: Back Surgery   Communication Communication Communication: No difficulties   Cognition Arousal/Alertness: Awake/alert Behavior During Therapy: WFL for tasks assessed/performed;Anxious (Frustrated with functional status) Overall Cognitive Status: Within Functional Limits for tasks assessed                                       General Comments  Sheet soiled with blood. Notified RN who came and assisted with linen change and application of bandage    Exercises     Shoulder Instructions      Home Living Family/patient expects to be discharged to:: Private residence Living Arrangements: Spouse/significant other Available Help at Discharge: Family;Available 24 hours/day Type of Home: House Home Access: Stairs to enter     Home Layout: Two level;1/2 bath on main level;Bed/bath upstairs     Bathroom Shower/Tub: Hospital doctor Toilet: Handicapped height     Home Equipment: Conservation officer, nature (2 wheels);Shower seat;BSC          Prior Functioning/Environment Prior Level of Function : Independent/Modified Independent             Mobility Comments: Using RW due to BLE weakness ADLs Comments: Performing ADLs; wife assiting with LB  ADLs as needed        OT Problem List: Decreased strength;Decreased range of motion;Decreased activity tolerance;Decreased safety awareness;Decreased knowledge of use of DME or AE;Decreased knowledge of precautions;Pain      OT Treatment/Interventions: Self-care/ADL training;Therapeutic exercise;Energy conservation;DME and/or AE instruction;Therapeutic activities;Patient/family education    OT Goals(Current goals can be found in the care plan section) Acute Rehab OT Goals Patient Stated Goal: Stop pain and get back to normal OT Goal Formulation: With patient/family Time For Goal Achievement: 08/24/21 Potential to Achieve Goals: Good ADL Goals Pt Will Perform Lower Body Dressing: with min assist;with adaptive equipment;sit to/from stand Pt Will Transfer to Toilet: with min assist;stand pivot transfer;bedside commode Pt Will Perform Toileting - Clothing Manipulation and hygiene: with min assist;sit to/from stand;sitting/lateral leans Additional ADL Goal #1: Pt will perform log roll with Min Guard A in preparation for ADLs Additional ADL Goal #2: Pt will independently verbalize 3/3 back precautions  OT Frequency: Min 2X/week   Barriers to D/C:            Co-evaluation              AM-PAC OT "6 Clicks" Daily Activity     Outcome Measure Help from another person eating meals?: A Little Help from another person taking care of personal grooming?: A Little Help from another person toileting, which includes using toliet, bedpan, or urinal?: A Lot Help from another person bathing (including washing, rinsing, drying)?: A Lot  Help from another person to put on and taking off regular upper body clothing?: A Lot Help from another person to put on and taking off regular lower body clothing?: A Lot 6 Click Score: 14   End of Session Equipment Utilized During Treatment: Rolling walker (2 wheels) Nurse Communication: Mobility status;Precautions;Weight bearing status  Activity Tolerance:  Patient limited by fatigue;Patient limited by pain Patient left: in bed;with call bell/phone within reach;with family/visitor present  OT Visit Diagnosis: Unsteadiness on feet (R26.81);Other abnormalities of gait and mobility (R26.89);Muscle weakness (generalized) (M62.81);Pain Pain - part of body:  (Back)                Time: 5366-4403 OT Time Calculation (min): 17 min Charges:  OT General Charges $OT Visit: 1 Visit OT Evaluation $OT Eval Moderate Complexity: [place], OTR/L Acute Rehab Pager: [phone] Office: [place] 08/10/2021, 4:12 PM

## 2021-08-11 NOTE — Evaluation (Signed)
Physical Therapy Evaluation Patient Details Name: Keith [name] MRN: [id number] DOB: [birth year] Today's Date: 08/11/2021  History of Present Illness  70 yo male presenting to ED on 11/1 with bilateral LE weakness. S/p T11-12 laminectomy, right T11-12 facetectomy, T11-T12 posterior spinal instrumented fusion with pedicle screw instrumentation and posterolateral fusion on 11/2. PMH including DM, HTN, obesity, PE, and scoliosis.   Clinical Impression  Pt presents with LE weakness, decreased knowledge and application of spinal precautions, min-mod difficulty performing mobility tasks, impaired gait, and decreased activity tolerance. Pt to benefit from acute PT to address deficits. Pt ambulated short room distance with RW, very effortful and pt complaining of severe back pain during mobility. PT recommending CIR to maximize functional independence prior to d/c home. PT to progress mobility as tolerated, and will continue to follow acutely.       Recommendations for follow up therapy are one component of a multi-disciplinary discharge planning process, led by the attending physician.  Recommendations may be updated based on patient status, additional functional criteria and insurance authorization.  Follow Up Recommendations Acute inpatient rehab (3hours/day)    Assistance Recommended at Discharge Frequent or constant Supervision/Assistance  Functional Status Assessment Patient has had a recent decline in their functional status and demonstrates the ability to make significant improvements in function in a reasonable and predictable amount of time.  Equipment Recommendations  None recommended by PT    Recommendations for Other Services       Precautions / Restrictions Precautions Precautions: Back Precaution Booklet Issued: No Precaution Comments: Reviewed back precautions Required Braces or Orthoses: Spinal Brace Spinal Brace: Other (comment) Spinal Brace Comments: No brace per MD  note Restrictions Weight Bearing Restrictions: No      Mobility  Bed Mobility Overal bed mobility: Needs Assistance Bed Mobility: Rolling;Sidelying to Sit;Sit to Sidelying Rolling: Min assist Sidelying to sit: Mod assist;HOB elevated     Sit to sidelying: Mod assist;HOB elevated General bed mobility comments: min-mod assist for log roll to/from EOB, increased assist to lift LEs into bed upon return to supine. Pt able to boost self up in bed with HOB flat in trendelenburg    Transfers Overall transfer level: Needs assistance Equipment used: Rolling walker (2 wheels) Transfers: Sit to/from Stand Sit to Stand: Mod assist           General transfer comment: mod assist for power up and rise, PT had to steady RW as pt had one hand pulling up on RW. Very effortful for pt to rise and steady.    Ambulation/Gait Ambulation/Gait assistance: Min assist Gait Distance (Feet): 20 Feet Assistive device: Rolling walker (2 wheels) Gait Pattern/deviations: Step-through pattern;Decreased dorsiflexion - right;Decreased dorsiflexion - left;Shuffle;Decreased stride length;Trunk flexed Gait velocity: decr   General Gait Details: steadying assist, cues for upright posture, placement in RW, increasing foot clearance.  Stairs            Wheelchair Mobility    Modified Rankin (Stroke Patients Only)       Balance Overall balance assessment: Needs assistance Sitting-balance support: Feet supported;Bilateral upper extremity supported Sitting balance-Leahy Scale: Fair Sitting balance - Comments: reliant on UE support   Standing balance support: Bilateral upper extremity supported;During functional activity;Reliant on assistive device for balance Standing balance-Leahy Scale: Poor                               Pertinent Vitals/Pain Pain Assessment: 0-10 Pain Score: 5  Pain Location: Back  Pain Descriptors / Indicators: Sore;Stabbing;Throbbing Pain Intervention(s):  Limited activity within patient's tolerance;Monitored during session;Premedicated before session;Repositioned    Home Living Family/patient expects to be discharged to:: Private residence Living Arrangements: Spouse/significant other Available Help at Discharge: Family;Available 24 hours/day Type of Home: House Home Access: Stairs to enter       Home Layout: Two level;1/2 bath on main level;Bed/bath upstairs Home Equipment: Conservation officer, nature (2 wheels);Shower seat;BSC      Prior Function Prior Level of Function : Independent/Modified Independent             Mobility Comments: Using RW due to BLE weakness ADLs Comments: Performing ADLs; wife assiting with LB ADLs as needed     Hand Dominance   Dominant Hand: Right    Extremity/Trunk Assessment   Upper Extremity Assessment Upper Extremity Assessment: Defer to OT evaluation    Lower Extremity Assessment Lower Extremity Assessment: RLE deficits/detail;LLE deficits/detail RLE Deficits / Details: 2+/5 hip flexion, 2+/5 knee extension, 2+/5 knee flexion, 3+/5 hip abd/add RLE Coordination: decreased gross motor LLE Deficits / Details: 2+/5 hip flexion, 3/5 knee extension, 2+/5 knee flexion, 3+/5 hip abd/add LLE Coordination: decreased gross motor    Cervical / Trunk Assessment Cervical / Trunk Assessment: Back Surgery  Communication   Communication: No difficulties  Cognition Arousal/Alertness: Awake/alert Behavior During Therapy: WFL for tasks assessed/performed Overall Cognitive Status: Within Functional Limits for tasks assessed                                          General Comments      Exercises     Assessment/Plan    PT Assessment Patient needs continued PT services  PT Problem List Decreased strength;Decreased mobility;Decreased activity tolerance;Decreased balance;Pain;Decreased knowledge of use of DME;Decreased safety awareness;Decreased knowledge of precautions       PT Treatment  Interventions DME instruction;Therapeutic activities;Gait training;Therapeutic exercise;Patient/family education;Balance training;Stair training;Functional mobility training;Neuromuscular re-education    PT Goals (Current goals can be found in the Care Plan section)  Acute Rehab PT Goals Patient Stated Goal: get my legs stronger PT Goal Formulation: With patient Time For Goal Achievement: 08/25/21 Potential to Achieve Goals: Good    Frequency Min 5X/week   Barriers to discharge        Co-evaluation               AM-PAC PT "6 Clicks" Mobility  Outcome Measure Help needed turning from your back to your side while in a flat bed without using bedrails?: A Little Help needed moving from lying on your back to sitting on the side of a flat bed without using bedrails?: A Lot Help needed moving to and from a bed to a chair (including a wheelchair)?: A Lot Help needed standing up from a chair using your arms (e.g., wheelchair or bedside chair)?: A Little Help needed to walk in hospital room?: A Little Help needed climbing 3-5 steps with a railing? : A Lot 6 Click Score: 15    End of Session Equipment Utilized During Treatment: Gait belt Activity Tolerance: Patient tolerated treatment well;Patient limited by pain Patient left: in bed;with bed alarm set;with call bell/phone within reach;with SCD's reapplied Nurse Communication: Mobility status PT Visit Diagnosis: Other abnormalities of gait and mobility (R26.89);Muscle weakness (generalized) (M62.81)    Time: 3614-4315 PT Time Calculation (min) (ACUTE ONLY): 21 min   Charges:   PT Evaluation $PT Eval Low Complexity:  1 Low         Stacie Glaze, PT DPT Acute Rehabilitation Services Pager [phone]  Office [phone]   Louis Matte 08/11/2021, 1:28 PM

## 2021-08-11 NOTE — Progress Notes (Signed)
Honeycomb dressing changed to surgical site x 2 during shift. Wound is draining moderate to copious amount of sanguinous drainage from small opening at bottom of site. Patient reporting 6-7/10 intermittent stabbing pain. Medicated with PRN pain meds for comfort. Patient also encouraged to offload in bed and agreeable. Using pillows to prop himself off back/surgical site. Will monitor for changes.

## 2021-08-11 NOTE — Plan of Care (Signed)

## 2021-08-11 NOTE — Progress Notes (Signed)
Inpatient Rehab Admissions Coordinator:   Per PT recommendations pt was screened for CIR by Shann Medal, PT, DPT.  Note that it will be difficult (though not impossible) to get insurance approval from [facility] for CIR.  I will place an order for CIR consult so we can fully assess.    Shann Medal, PT, DPT Admissions Coordinator [phone] 08/11/21  2:48 PM

## 2021-08-11 NOTE — Progress Notes (Signed)
Neurosurgery Service Progress Note  Subjective: No acute events overnight, leg strength continues to improve, still quite weak obviously, back pain as expected, some drainage from the wound that has stopped  Objective: Vitals:   08/10/21 2100 08/11/21 0040 08/11/21 0441 08/11/21 0815  BP: (!) 104/56 111/73 123/77 100/62  Pulse: 82 81 84 81  Resp: 16 16 17 18   Temp: 98.4 F (36.9 C) 98.4 F (36.9 C) 98.1 F (36.7 C) 98.1 F (36.7 C)  TempSrc: Oral Oral Oral Oral  SpO2: 97% 95% 98% 96%  Weight:      Height:        Physical Exam: Strength 5/5 in BUE, BLE are diffusely 3/5 bilaterally proximally and 4-/5 distally, increased reflexes at the left patella and normal on the right, +ankle clonus on left and not right, mild T12 sensory level  Assessment & Plan: 70 y.o. man w/ recurrent thoracic myelopathy in the setting of degenerative scoliosis, MRI with recurrent stenosis at T11-12 with worsening cord signal change. 11/2 s/p T11-12 repeat decompression and PSIF. 11/3 XR T-spine hardware in place, difficult to evaluate in detail 2/2 habitus  -PT/OT, great CIR candidate given how far he's off his baseline and potential for improvement  -SQH -activity / diet as tolerated, no brace / restrictions needed  Judith Part  08/11/21 9:15 AM

## 2021-08-12 NOTE — Progress Notes (Signed)
   Providing Compassionate, Quality Care - Together   Subjective: Patient reports no issues overnight. He reports his strength continues to improve.  Objective: Vital signs in last 24 hours: Temp:  [98.1 F (36.7 C)-98.5 F (36.9 C)] 98.3 F (36.8 C) (11/05 0751) Pulse Rate:  [69-82] 78 (11/05 0751) Resp:  [15-20] 18 (11/05 0751) BP: (104-113)/(66-78) 110/67 (11/05 0751) SpO2:  [96 %-99 %] 98 % (11/05 0751)  Intake/Output from previous day: 11/04 0701 - 11/05 0700 In: 240 [P.O.:240] Out: 1200 [Urine:1200] Intake/Output this shift: No intake/output data recorded.  Alert and oriented x 4 PERRLA CN II-XII grossly intact MAE, Strength and sensation intact BUE RLE 4/5 hip flexion, 4/5 knee ext, 4/5 knee flexion, 4/5 plantar and dorsiflexion LLE 4-/5 hip flexion, 4-/5 knee ext, 4-/5 knee flexion, 4-/5 plantar and dorsiflexion Incision is covered with ABD, Dressing is clean, dry, and intact   Lab Results: No results for input(s): WBC, HGB, HCT, PLT in the last 72 hours. BMET No results for input(s): NA, K, CL, CO2, GLUCOSE, BUN, CREATININE, CALCIUM in the last 72 hours.  Studies/Results: No results found.  Assessment/Plan: Patient presented to the [facility] ED on 08/07/2021 with bilateral leg weakness. He had a history of thoracic myelopathy and had undergone decompression with Dr. Zada Finders in 2019. Imaging revealed recurrent stenosis at T11-12, with worsening cord signal change. He underwent a T11-12 repeat decompression, followed by T11-12 PSIF with Dr. Zada Finders on 08/09/2021. He is voiding without issue. His strength has improved dramatically since surgery.   LOS: 3 days   -Agree with plan for pursuing CIR -Continue to mobilize   Viona Gilmore, DNP, AGNP-C Nurse Practitioner  [facility] [place] [address], [place], [place] [address] P: [phone]    F: [phone]  08/12/2021, 9:47 AM

## 2021-08-12 NOTE — Progress Notes (Signed)
Inpatient Rehab Admissions:  Inpatient Rehab Consult received.  I met with patient at the bedside for rehabilitation assessment and to discuss goals and expectations of an inpatient rehab admission.  Pt acknowledged understanding. Pt interested in pursuing CIR. Pt gave permission to contact wife, Altha Harm. Called Christine and no one answered. Awaiting return call. Will continue to follow.  Signed: Gayland Curry, [place], [place] Admissions Coordinator [phone]

## 2021-08-12 NOTE — Progress Notes (Addendum)
Physical Therapy Treatment Patient Details Name: Keith [name] MRN: [id number] DOB: 05/02/1951 Today's Date: 08/12/2021   History of Present Illness 70 yo male presenting to ED on 11/1 with bilateral LE weakness. S/p T11-12 laminectomy, right T11-12 facetectomy, T11-T12 posterior spinal instrumented fusion with pedicle screw instrumentation and posterolateral fusion on 11/2. PMH including DM, HTN, obesity, PE, and scoliosis.    PT Comments    Pt received in supine, initially drowsy but agreeable to therapy session and more alert once EOB. Pt limited by fatigue and pain during this session. Pt with 0/3 recall of back precautions at beginning of session despite having had multiple previous back surgeries, but was able to recall 3/3 at the end of session. Precautions handout given to patient with verbal understanding of all precautions at this time. Pt performed STS and minimal gait distance at bedside with up to modA +2 physical assist for safety, frequent reminders to not hold breath. Continue to recommend CIR upon DC. Pt continues to benefit from PT services to progress toward functional mobility goals.      Recommendations for follow up therapy are one component of a multi-disciplinary discharge planning process, led by the attending physician.  Recommendations may be updated based on patient status, additional functional criteria and insurance authorization.  Follow Up Recommendations  Acute inpatient rehab (3hours/day)     Assistance Recommended at Discharge Frequent or constant Supervision/Assistance  Equipment Recommendations  None recommended by PT    Recommendations for Other Services       Precautions / Restrictions Precautions Precautions: Back Precaution Booklet Issued: Yes (comment) Precaution Comments: Reviewed back precautions (Patient with 3/3 recall at end of session) Required Braces or Orthoses: Other Brace Other Brace: Pt brought lace up ankle brace from home for L  ankle Restrictions Weight Bearing Restrictions: No     Mobility  Bed Mobility Overal bed mobility: Needs Assistance Bed Mobility: Rolling;Sidelying to Sit Rolling: Min assist (Assist for BLE management) Sidelying to sit: Mod assist;+2 for physical assistance;HOB elevated   General bed mobility comments: minA for log roll, modA +2 to get sitting EOB, pt limited by pain and fatigue    Transfers Overall transfer level: Needs assistance Equipment used: Rolling walker (2 wheels) Transfers: Sit to/from Stand Sit to Stand: Mod assist;+2 physical assistance    General transfer comment: modA +2 needed for power up and safe hand placement on RW, needs bed height significantly elevated prior to attempting x3 total reps (cushions also in chair to keep height higher)    Ambulation/Gait Ambulation/Gait assistance: Mod assist;+2 physical assistance Gait Distance (Feet): 10 Feet (5 ft fwd/retro, seated break, 10 ft fwd/retro) Assistive device: Rolling walker (2 wheels) Gait Pattern/deviations: Step-through pattern;Decreased stride length;Decreased dorsiflexion - right;Decreased dorsiflexion - left Gait velocity: decreased   General Gait Details: Assist for steadying in RW, cues for upright posture, pt limited by pain and fatigue        Balance Overall balance assessment: Needs assistance Sitting-balance support: Feet supported;Bilateral upper extremity supported Sitting balance-Leahy Scale: Fair Sitting balance - Comments: reliant on UE support Postural control: Posterior lean Standing balance support: Bilateral upper extremity supported;During functional activity;Reliant on assistive device for balance Standing balance-Leahy Scale: Poor       Cognition Arousal/Alertness: Awake/alert Behavior During Therapy: WFL for tasks assessed/performed Overall Cognitive Status: Within Functional Limits for tasks assessed    General Comments: Pt initially drowsy, but alert once EOB        Exercises Other Exercises Other Exercises: Supine: ankle pumps  x5, heel slides x5, QS x5, hip abduction x5 Other Exercises: Seated: LAQ x5    General Comments General comments (skin integrity, edema, etc.): Wound draining, notified RN who came in and assessed and stated improvement      Pertinent Vitals/Pain Pain Assessment: 0-10 Pain Score: 5  Pain Location: Back, particularly lower L side Pain Descriptors / Indicators: Sore;Stabbing;Throbbing;Moaning;Discomfort Pain Intervention(s): Limited activity within patient's tolerance;Monitored during session;Repositioned     PT Goals (current goals can now be found in the care plan section) Acute Rehab PT Goals Patient Stated Goal: get my legs stronger PT Goal Formulation: With patient Time For Goal Achievement: 08/25/21 Progress towards PT goals: Progressing toward goals    Frequency    Min 5X/week    PT Plan Current plan remains appropriate       AM-PAC PT "6 Clicks" Mobility   Outcome Measure  Help needed turning from your back to your side while in a flat bed without using bedrails?: A Little Help needed moving from lying on your back to sitting on the side of a flat bed without using bedrails?: A Lot Help needed moving to and from a bed to a chair (including a wheelchair)?: A Lot Help needed standing up from a chair using your arms (e.g., wheelchair or bedside chair)?: A Lot Help needed to walk in hospital room?: A Lot Help needed climbing 3-5 steps with a railing? : Total 6 Click Score: 12    End of Session Equipment Utilized During Treatment: Gait belt;Other (comment) (L ankle brace) Activity Tolerance: Patient tolerated treatment well;Patient limited by fatigue;Patient limited by pain Patient left: in chair;with call bell/phone within reach;with chair alarm set Nurse Communication: Mobility status;Other (comment) (Pt requesting water and ice upon therapist exit) PT Visit Diagnosis: Other abnormalities of gait and  mobility (R26.89);Muscle weakness (generalized) (M62.81)     Time: 8832-5498 PT Time Calculation (min) (ACUTE ONLY): 37 min  Charges:  $Gait Training: 8-22 mins $Therapeutic Activity: 8-22 mins                     Evelene Croon, Student PTA CI: Carly P., PTA  Evelene Croon 08/12/2021, 11:39 AM

## 2021-08-13 NOTE — PMR Pre-admission (Signed)
PMR Admission Coordinator Pre-Admission Assessment  Patient: Keith [name] is an 70 y.o., male MRN: [id number] DOB: 14-Oct-1950 Height: 6' (182.9 cm) Weight: 128.3 kg  Insurance Information HMO: yes    PPO:      PCP:      IPA:      80/20:      OTHER:  PRIMARY: U[employer] Medicare      Policy#: [id number]      Subscriber: patient CM Name: Roselyn Reef      Phone#: [phone]     Fax#: [phone] Pre-Cert#: [id number] approval via expedited appeal, updates due to fax listed above on 11/18    Employer:  Benefits:  Phone #: [email or web address]     Name:  Eff. Date: 10/08/20-10/07/21     Deduct: $0 (does not have deductible)      Out of Pocket Max: $3,600 ($1,931.20 met)      Life Max: NA CIR: $295/day co-pay for days 1-5, 100% coverage days 6+      SNF: 100% coverage days 1-20, $188 co-pay/day days 21-40, 100% coverage days 41-100 Outpatient: $30/visit co-pay     Co-Pay:  Home Health: 100% coverage      Co-Pay:  DME: 80%      Co-Pay: 20% co-insurance Providers: in-network SECONDARY:       Policy#:      Phone#:   Development worker, community:       Phone#:   The Engineer, petroleum" for patients in Inpatient Rehabilitation Facilities with attached "Privacy Act [place] Records" was provided and verbally reviewed with: Patient  Emergency Contact Information Contact Information     Name Relation Home Work Mobile   Gotay,Christine Spouse [phone]  [phone]       Current Medical History  Patient Admitting Diagnosis: s/p T11-12 repeat decompression and T11-12 PSIF History of Present Illness: Pt is a 70 year old male with medical hx significant for: DM, HTN, obesity, PE, scoliosis, h/o thoracic myelopathy and decompression in 2019. Pt presented to hospital on 10/31 d/t bilateral LE weakness x2 weeks. Pt had two nerve blocks two weeks prior to presentation. This had excellent relief for about 7-10 days.  Pt denies B/B changes, some numbness in BLEs starting at the  inguinal ligament and distal. MRI L spine showed grade 1 L4-5 spondylolithesis, moderate R L1-2 and L2-3 foraminal stenosis, L3-4 severe canal/bilateral foraminal stenosis, R>L L4-5 foraminal stenosis, lateral recess stenosis with some left sided 5-1 foraminal stenosis.  MRI t spine showed recurrent stenosis at T11-12 with worsening cord signal change.  Pt underwent repeat decompression and PSIF at T11-12 on 11/2 per Dr. Zada Finders.  Post op course pain management.  Pt was evaluated by therapy and recommendations were for CIR.   Patient's medical record from [facility] has been reviewed by the rehabilitation admission coordinator and physician.  Past Medical History  Past Medical History:  Diagnosis Date   Allergy    Chronic back pain    Diabetes mellitus without complication ([place])    DNR (do not resuscitate) 11/19/2020   Duodenal ulcer hemorrhage 08/29/2014   ED (erectile dysfunction)    Esophageal stricture 08/30/2014   Glucose intolerance (impaired glucose tolerance)    Hiatal hernia 08/30/2014   Hypertension    Hypokalemia 04/11/2013   MRSA carrier 08/30/2014   Obesity    Osteoarthritis    Pneumonia    Pulmonary embolism ([place]) 06/2020   Scoliosis 2016   Spinal stenosis of lumbar region    Urinary tract infection 04/11/2013  Has the patient had major surgery during 100 days prior to admission? Yes  Family History   family history includes Asthma in his mother; Diabetes in his mother; Hypertension in his father; Stroke in his father.  Current Medications  Current Facility-Administered Medications:    alum & mag hydroxide-simeth (MAALOX/MYLANTA) 200-200-20 MG/5ML suspension 15-30 mL, 15-30 mL, Oral, TID PRN, Judith Part, MD   atenolol (TENORMIN) tablet 50 mg, 50 mg, Oral, Daily, 50 mg at 08/21/21 0905 **AND** chlorthalidone (HYGROTON) tablet 25 mg, 25 mg, Oral, Daily, Ostergard, Thomas A, MD, 25 mg at 08/21/21 8182   cloNIDine (CATAPRES) tablet 0.1 mg, 0.1 mg,  Oral, Daily, Ostergard, Thomas A, MD, 0.1 mg at 08/20/21 1049   DULoxetine (CYMBALTA) DR capsule 60 mg, 60 mg, Oral, Daily, Ostergard, Joyice Faster, MD, 60 mg at 08/21/21 0905   gabapentin (NEURONTIN) capsule 600 mg, 600 mg, Oral, TID, Judith Part, MD, 600 mg at 08/21/21 0905   heparin injection 5,000 Units, 5,000 Units, Subcutaneous, Q8H, Ostergard, Joyice Faster, MD, 5,000 Units at 08/21/21 9937   HYDROmorphone (DILAUDID) injection 1 mg, 1 mg, Intravenous, Q3H PRN, Judith Part, MD, 1 mg at 08/20/21 1718   metFORMIN (GLUCOPHAGE) tablet 250 mg, 250 mg, Oral, Q breakfast, Ostergard, Joyice Faster, MD, 250 mg at 08/21/21 1696   multivitamin with minerals tablet 1 tablet, 1 tablet, Oral, Q breakfast, Judith Part, MD, 1 tablet at 08/21/21 7893   oxyCODONE-acetaminophen (PERCOCET/ROXICET) 5-325 MG per tablet 1-2 tablet, 1-2 tablet, Oral, Q4H PRN, Judith Part, MD, 2 tablet at 08/21/21 0914   polyethylene glycol (MIRALAX / GLYCOLAX) packet 17 g, 17 g, Oral, Daily PRN, Judith Part, MD   potassium chloride SA (KLOR-CON) CR tablet 20 mEq, 20 mEq, Oral, Daily, Ostergard, Thomas A, MD, 20 mEq at 08/21/21 0905   simvastatin (ZOCOR) tablet 40 mg, 40 mg, Oral, QHS, Judith Part, MD, 40 mg at 08/20/21 2201  Patients Current Diet:  Diet Order             Diet regular Room service appropriate? Yes; Fluid consistency: Thin  Diet effective now                   Precautions / Restrictions Precautions Precautions: Back Precaution Booklet Issued: No Precaution Comments: 3/3 recalled Spinal Brace: Other (comment) Spinal Brace Comments: No brace per MD note Other Brace: Utilized lace up ankle brace today that pt brought from home Restrictions Weight Bearing Restrictions: No   Has the patient had 2 or more falls or a fall with injury in the past year? Yes  Prior Activity Level Limited Community (1-2x/wk): gets out of house 2-3 days/week; drives  Prior Functional  Level Self Care: Did the patient need help bathing, dressing, using the toilet or eating? Independent  Indoor Mobility: Did the patient need assistance with walking from room to room (with or without device)? Independent  Stairs: Did the patient need assistance with internal or external stairs (with or without device)? Independent  Functional Cognition: Did the patient need help planning regular tasks such as shopping or remembering to take medications? Independent  Patient Information Are you of Hispanic, Latino/a,or Spanish origin?: A. No, not of Hispanic, Latino/a, or Spanish origin What is your race?: B. Black or African American Do you need or want an interpreter to communicate with a doctor or health care staff?: 0. No  Patient's Response To:  Health Literacy and Transportation Is the patient able to respond to health literacy  and transportation needs?: Yes Health Literacy - How often do you need to have someone help you when you read instructions, pamphlets, or other written material from your doctor or pharmacy?: Never In the past 12 months, has lack of transportation kept you from medical appointments or from getting medications?: No In the past 12 months, has lack of transportation kept you from meetings, work, or from getting things needed for daily living?: No  Home Assistive Devices / Equipment Home Equipment: Conservation officer, nature (2 wheels), Shower seat, [facility]  Prior Device Use: Indicate devices/aids used by the patient prior to current illness, exacerbation or injury? Walker  Current Functional Level Cognition  Overall Cognitive Status: Within Functional Limits for tasks assessed Orientation Level: Oriented X4 General Comments: Very motivated, good participation    Extremity Assessment (includes Sensation/Coordination)  Upper Extremity Assessment: Overall WFL for tasks assessed  Lower Extremity Assessment: Defer to PT evaluation RLE Deficits / Details: 2+/5 hip flexion,  2+/5 knee extension, 2+/5 knee flexion, 3+/5 hip abd/add RLE Coordination: decreased gross motor LLE Deficits / Details: 2+/5 hip flexion, 3/5 knee extension, 2+/5 knee flexion, 3+/5 hip abd/add LLE Coordination: decreased gross motor    ADLs  Overall ADL's : Needs assistance/impaired Eating/Feeding: Set up, Sitting Grooming: Oral care, Minimal assistance, Standing, Wash/dry face, Min guard, Sitting Grooming Details (indicate cue type and reason): Pt performing oral care while standing at sink with Min A for balance. Pt requiring significant effort and maintaining UE support due to decreased balance. Sitting at [facility] for rest break. Performing face hygiene while seated Upper Body Bathing: Set up, Sitting Upper Body Bathing Details (indicate cue type and reason): bathed while sitting at the sink Lower Body Bathing: Maximal assistance, Sit to/from stand Lower Body Bathing Details (indicate cue type and reason): pt able to stand at the sink while therapist and his wife assisted with peri area bathing. Pt can bathe thighs to knees while sitting Upper Body Dressing : Set up, Sitting Lower Body Dressing: Moderate assistance, Sit to/from stand Lower Body Dressing Details (indicate cue type and reason): Pt able to bring ankles to knees for donning of shoes. Requiring assistance to tie and wrap L ankle brace after pt donned over foot. Assistance for managing back of R shoe Toilet Transfer: Minimal assistance, Ambulation, BSC/3in1, Rolling walker (2 wheels) Toilet Transfer Details (indicate cue type and reason): Min A for power up and gaining balance. Very slow and requiring significant effort. But very motivated despite weakness and pain Toileting - Clothing Manipulation Details (indicate cue type and reason): Pt managing urinal at EOB with increased time Functional mobility during ADLs: Minimal assistance, Rolling walker (2 wheels) General ADL Comments: Very slow and requiring significant effort. But very  motivated despite weakness and pain    Mobility  Overal bed mobility: Needs Assistance Bed Mobility: Rolling, Sit to Sidelying Rolling: Min guard Sidelying to sit: Min assist Sit to sidelying: Mod assist, HOB elevated General bed mobility comments: cues for proper body mechanics at times, minA for BLE to advance safely to EOB but pt with good initiation/use of railing for trunk raise    Transfers  Overall transfer level: Needs assistance Equipment used: Rolling walker (2 wheels) Transfers: Sit to/from Stand, Bed to chair/wheelchair/BSC Sit to Stand: Mod assist, From elevated surface Bed to/from chair/wheelchair/BSC transfer type:: Step pivot Step pivot transfers: Min assist General transfer comment: able to stand with +1 assist from elevated bed height, second person present but not needing to assist to lift; minA for stepping around  to chair ~21f with RW    Ambulation / Gait / Stairs / Wheelchair Mobility  Ambulation/Gait Ambulation/Gait assistance: +2 safety/equipment, Min assist (Chair follow) Gait Distance (Feet): 40 Feet (40 ft, seated break, 40 ft) Assistive device: Rolling walker (2 wheels) Gait Pattern/deviations: Step-through pattern, Decreased stride length, Trunk flexed General Gait Details: defer hallway gait progression due to lack of +2 assist at time of session and pt requesting to eat dinner, wants to walk to bathroom later in day after his meal. Gait velocity: decreased    Posture / Balance Dynamic Sitting Balance Sitting balance - Comments: reliant on UE support Balance Overall balance assessment: Needs assistance Sitting-balance support: Feet supported, Bilateral upper extremity supported Sitting balance-Leahy Scale: Fair Sitting balance - Comments: reliant on UE support Postural control: Posterior lean Standing balance support: Bilateral upper extremity supported, During functional activity, Reliant on assistive device for balance Standing balance-Leahy Scale:  Poor Standing balance comment: Reliant on BUE for balance    Special needs/care consideration Skin surgical incision: back   Previous Home Environment (from acute therapy documentation) Living Arrangements: Spouse/significant other  Lives With: Spouse Available Help at Discharge: Family, Available 24 hours/day Type of Home: House Home Layout: Two level, 1/2 bath on main level Alternate Level Stairs-Rails: Right Alternate Level Stairs-Number of Steps: 11 Home Access: Stairs to enter Entrance Stairs-Rails: Right Entrance Stairs-Number of Steps: 2 Bathroom Shower/Tub: WGaffer TChiropodist Handicapped height Bathroom Accessibility: Yes How Accessible: Accessible via wheelchair Home Care Services: No  Discharge Living Setting Plans for Discharge Living Setting: Patient's home Type of Home at Discharge: House Discharge Home Layout: Two level, 1/2 bath on main level Alternate Level Stairs-Rails: Right Alternate Level Stairs-Number of Steps: 11 Discharge Home Access: Stairs to enter Entrance Stairs-Rails: Right Entrance Stairs-Number of Steps: 2 Discharge Bathroom Shower/Tub: Walk-in shower, Tub/shower unit Discharge Bathroom Toilet: Handicapped height Discharge Bathroom Accessibility: Yes How Accessible: Accessible via walker Does the patient have any problems obtaining your medications?: No  Social/Family/Support Systems Anticipated Caregiver: CGraison Leinberger wife  Goals Patient/Family Goal for Rehab: Supervision-Min A: PT/OT Expected length of stay: 14-16 days Pt/Family Agrees to Admission and willing to participate: Yes Program Orientation Provided & Reviewed with Pt/Caregiver Including Roles  & Responsibilities: Yes  Decrease burden of Care through IP rehab admission: NA  Possible need for SNF placement upon discharge: Not anticipated  Patient Condition: [facility].  I met with patient at the bedside for inpatient rehabilitation assessment.  Patient will benefit from ongoing PT and OT, can actively participate in 3 hours of therapy a day 5 days of the week, and can make measurable gains during the admission.  Patient will also benefit from the coordinated team approach during an Inpatient Acute Rehabilitation admission.  The patient will receive intensive therapy as well as Rehabilitation physician, nursing, social worker, and care management interventions.  Due to safety, skin/wound care, disease management, medication administration, pain management, and patient education the patient requires 24 hour a day rehabilitation nursing.  The patient is currently min asisst with mobility and basic ADLs.  Discharge setting and therapy post discharge at home with home health is anticipated.  Patient has agreed to participate in the Acute Inpatient Rehabilitation Program and will admit today.  Preadmission Screen Completed By:  CShann Medal PT, DPT and LBethel Born 08/21/2021 10:22 AM ______________________________________________________________________   Discussed status with Dr. KLetta Pateon 08/21/21  at 10:22  AM  and received approval for admission today.  Admission Coordinator:  Shann Medal, PT, DPT, Gayland Curry, time 10:22 AM Sudie Grumbling 08/21/21    Assessment/Plan: Diagnosis:  Thoracic myelopathy with paraplegia  Does the need for close, 24 hr/day Medical supervision in concert with the patient's rehab needs make it unreasonable for this patient to be served in a less intensive setting? Yes Co-Morbidities requiring supervision/potential complications: DM, HTN, Lumbar spinal stenosis Due to bladder management, bowel management, safety, skin/wound care, disease management, medication administration, pain management, and patient education, does the patient require 24 hr/day rehab nursing? Yes Does the patient require coordinated care of a physician,  rehab nurse, PT, OT, and SLP to address physical and functional deficits in the context of the above medical diagnosis(es)? Yes Addressing deficits in the following areas: balance, endurance, locomotion, strength, transferring, bowel/bladder control, bathing, dressing, and feeding Can the patient actively participate in an intensive therapy program of at least 3 hrs of therapy 5 days a week? Yes The potential for patient to make measurable gains while on inpatient rehab is good Anticipated functional outcomes upon discharge from inpatient rehab: min assist PT, min assist OT, n/a SLP Estimated rehab length of stay to reach the above functional goals is: 14-16d Anticipated discharge destination: Home 10. Overall Rehab/Functional Prognosis: good   MD Signature: Charlett Blake M.D. [facility] Fellow Am Acad of Phys Med and Rehab Diplomate Am Board of Electrodiagnostic Med Fellow Am Board of Interventional Pain

## 2021-08-13 NOTE — Plan of Care (Signed)

## 2021-08-13 NOTE — Progress Notes (Signed)
   Providing Compassionate, Quality Care - Together   Subjective: Patient reports no issues overnight.  Objective: Vital signs in last 24 hours: Temp:  [97.5 F (36.4 C)-98.6 F (37 C)] 98.3 F (36.8 C) (11/06 0736) Pulse Rate:  [72-85] 78 (11/06 0736) Resp:  [18-19] 18 (11/06 0736) BP: (103-124)/(58-69) 109/67 (11/06 0736) SpO2:  [94 %-99 %] 99 % (11/06 0736)  Intake/Output from previous day: 11/05 0701 - 11/06 0700 In: -  Out: 700 [Urine:700] Intake/Output this shift: No intake/output data recorded.  Alert and oriented x 4 PERRLA CN II-XII grossly intact MAE, Strength and sensation intact BUE RLE 4/5 hip flexion, 4/5 knee ext, 4/5 knee flexion, 4/5 plantar and dorsiflexion LLE 4-/5 hip flexion, 4-/5 knee ext, 4-/5 knee flexion, 4-/5 plantar and dorsiflexion Incision is covered with ABD, Dressing is clean, dry, and intact   Assessment/Plan: Patient presented to the [facility] ED on 08/07/2021 with bilateral leg weakness. He had a history of thoracic myelopathy and had undergone decompression with Dr. Zada Finders in 2019. Imaging revealed recurrent stenosis at T11-12, with worsening cord signal change. He underwent a T11-12 repeat decompression, followed by T11-12 PSIF with Dr. Zada Finders on 08/09/2021. He is voiding without issue. His strength has improved dramatically since surgery.   LOS: 4 days   -Plan is for CIR at discharge -Continue to mobilize   Viona Gilmore, [place], AGNP-C Nurse Practitioner  [facility] [place] [address], [address], [place], [place] [address] P: [phone]    F: [phone]  08/13/2021, 12:03 PM

## 2021-08-13 NOTE — Progress Notes (Signed)
Inpatient Rehab Admissions Coordinator:  Attempted to contact pt's wife again. No one answered. Left message; awaiting return call. Will continue to follow.   Gayland Curry, [place], [place] Admissions Coordinator [phone]

## 2021-08-14 NOTE — Progress Notes (Addendum)
Physical Therapy Treatment Patient Details Name: Keith [name] MRN: [id number] DOB: [birth year] Today's Date: 08/14/2021   History of Present Illness 70 yo male presenting to ED on 11/1 with bilateral LE weakness. S/p T11-12 laminectomy, right T11-12 facetectomy, T11-T12 posterior spinal instrumented fusion with pedicle screw instrumentation and posterolateral fusion on 11/2. PMH including DM, HTN, obesity, PE, and scoliosis.    PT Comments    Pt received in chair with wife, Erasmo Downer, present and supportive throughout session. Pt limited by pain and fatigue after recently working with OT prior to PT session. Pt needing up to modA +2 physical assist for RW management, body mechanics, and safety during standing/pivotal transfers. Pt with improved recall of back precautions today, 3/3 at beginning of session after hints given. Continue to recommend CIR upon DC as patient is excellent candidate for intense therapy to improve strength, mobility, and functional outcomes. Pt continues to benefit from PT services to progress toward functional mobility goals.      Recommendations for follow up therapy are one component of a multi-disciplinary discharge planning process, led by the attending physician.  Recommendations may be updated based on patient status, additional functional criteria and insurance authorization.  Follow Up Recommendations  Acute inpatient rehab (3hours/day)     Assistance Recommended at Discharge Frequent or constant Supervision/Assistance  Equipment Recommendations  None recommended by PT    Recommendations for Other Services       Precautions / Restrictions Precautions Precautions: Back Precaution Booklet Issued: No Precaution Comments: Reviewed back precautions (Patient with 3/3 recall at beginning of session) Required Braces or Orthoses: Other Brace Spinal Brace Comments: No brace per MD note Other Brace: Did not use lace up ankle brace today Restrictions Weight  Bearing Restrictions: No     Mobility  Bed Mobility Overal bed mobility: Needs Assistance Bed Mobility: Rolling;Sit to Sidelying Rolling: Min assist (Assist for BLE management)   General bed mobility comments: Pt received in chair, up top minA for bed mobility to get back into supine   Transfers Overall transfer level: Needs assistance Equipment used: Rolling walker (2 wheels) Transfers: Sit to/from Stand Sit to Stand: Mod assist;+2 physical assistance   General transfer comment: modA +2 needed for power up and safe hand placement on RW, utilized momentum technique with no added benefit, still pauses right before standing inhibiting his ability to stand upright   Ambulation/Gait  General Gait Details: Deferred today due to pain and fatigue, pt worked with OT earlier this afternoon and became more fatigued afterwards    Balance Overall balance assessment: Needs assistance Sitting-balance support: Feet supported;Bilateral upper extremity supported Sitting balance-Leahy Scale: Fair Sitting balance - Comments: reliant on UE support Postural control: Posterior lean Standing balance support: Bilateral upper extremity supported;During functional activity;Reliant on assistive device for balance Standing balance-Leahy Scale: Poor      Cognition Arousal/Alertness: Awake/alert Behavior During Therapy: WFL for tasks assessed/performed Overall Cognitive Status: Within Functional Limits for tasks assessed   General Comments: Pt agitated from pain       Exercises Other Exercises Other Exercises: Seated: Heel raise/ toe raise x5, LAQ x5, seated march (pt leaned back in chair, only raised legs a couple of inches to not break bending precautions) x5    General Comments General comments (skin integrity, edema, etc.): Wife, Erasmo Downer, present and supportive throughout; pt with high level of pain upon standing as demonstrated by facial grimacing, tight gripping of RW, and moaning/grunting       Pertinent Vitals/Pain Pain Assessment: 0-10 Pain  Score: 5  (5/10 while sitting) Pt appearing to be in more pain than he reports due to body language, see below. Faces Pain Scale: Hurts whole lot (When standing, pain increased with grimacing and moaning) Pain Location: Back, particularly lower L side Pain Descriptors / Indicators: Sore;Stabbing;Throbbing;Moaning;Discomfort (Described as an "axe to my back") Pain Intervention(s): Monitored during session;Limited activity within patient's tolerance;Ice applied (Pt and wife verbalized understanding to not leave ice on more than 30 mins)     PT Goals (current goals can now be found in the care plan section) Acute Rehab PT Goals Patient Stated Goal: get my legs stronger PT Goal Formulation: With patient Time For Goal Achievement: 08/25/21 Progress towards PT goals: Progressing toward goals    Frequency    Min 5X/week      PT Plan Current plan remains appropriate       AM-PAC PT "6 Clicks" Mobility   Outcome Measure  Help needed turning from your back to your side while in a flat bed without using bedrails?: A Little Help needed moving from lying on your back to sitting on the side of a flat bed without using bedrails?: A Lot Help needed moving to and from a bed to a chair (including a wheelchair)?: A Lot Help needed standing up from a chair using your arms (e.g., wheelchair or bedside chair)?: A Lot Help needed to walk in hospital room?: A Lot Help needed climbing 3-5 steps with a railing? : Total 6 Click Score: 12    End of Session Equipment Utilized During Treatment: Gait belt Activity Tolerance: Patient tolerated treatment well;Patient limited by fatigue;Patient limited by pain Patient left: with chair alarm set;in bed;with bed alarm set;with family/visitor present Nurse Communication: Mobility status PT Visit Diagnosis: Other abnormalities of gait and mobility (R26.89);Muscle weakness (generalized) (M62.81)     Time:  2542-7062 PT Time Calculation (min) (ACUTE ONLY): 24 min  Charges:  $Therapeutic Exercise: 8-22 mins $Therapeutic Activity: 8-22 mins                     Evelene Croon, Student PTA CI: Carly P., PTA  Carly M Poff 08/14/2021, 4:59 PM

## 2021-08-14 NOTE — Progress Notes (Addendum)
Inpatient Rehab Admissions Coordinator:  Saw pt at bedside. Informed him that beginning insurance authorization. He informed AC that he discussed CIR with his wife.  Will continue to follow.  ADDENDUM: Was able to speak with pt's wife, Altha Harm. Discussed CIR with her. She acknowledged understanding.   Gayland Curry, [place], [place] Admissions Coordinator [phone]

## 2021-08-14 NOTE — Progress Notes (Signed)
Neurosurgery Service Progress Note  Subjective: No acute events overnight, incisional pain otherwise doing well with therapies  Objective: Vitals:   08/13/21 2347 08/14/21 0430 08/14/21 1000 08/14/21 1300  BP: (!) 115/55 113/67 108/68 105/64  Pulse: 80 88 80 78  Resp: (!) 22 20 20    Temp: 99.3 F (37.4 C) 98.1 F (36.7 C) 98.2 F (36.8 C) 98.1 F (36.7 C)  TempSrc: Oral Oral Axillary Oral  SpO2: 96% 99% 98% 99%  Weight:      Height:        Physical Exam: Strength 5/5 in BUE, BLE are diffusely 3/5 bilaterally proximally and 4-/5 distally, normalized reflexes and no ankle clonus today, mild T12 sensory level  Assessment & Plan: 70 y.o. man w/ recurrent thoracic myelopathy in the setting of degenerative scoliosis, MRI with recurrent stenosis at T11-12 with worsening cord signal change. 11/2 s/p T11-12 repeat decompression and PSIF. 11/3 XR T-spine hardware in place, difficult to evaluate in detail 2/2 habitus  -PT/OT, great CIR candidate given how far he's off his baseline and potential for improvement, insurance authorization pending -SCDs, TEDs, SQH -activity / diet as tolerated, no brace / restrictions needed  Judith Part  08/14/21 2:25 PM

## 2021-08-14 NOTE — Progress Notes (Signed)
Occupational Therapy Evaluation Patient Details Name: Keith [name] MRN: [id number] DOB: [birth year] Today's Date: 08/14/2021   History of Present Illness 70 yo male presenting to ED on 11/1 with bilateral LE weakness. S/p T11-12 laminectomy, right T11-12 facetectomy, T11-T12 posterior spinal instrumented fusion with pedicle screw instrumentation and posterolateral fusion on 11/2. PMH including DM, HTN, obesity, PE, and scoliosis.   Clinical Impression   Keith [name] is progressing well, he was on the toilet with RN and NT upon arrival. Overall he required mod A to stand from the toilet, and from lower sitting surfaces (room chair while bathing at the sink), but able to sit<>stand from elevated surfaces with min guard. Overall pt was set up for all upper body ADLs while sitting at the sink and up to max A for lower body ADLs with sit<>stands at the sink. Pt required verbal cues for back precautions throughout as well as safe placement of BUE for all transfers. Pt was limited by pain and activity tolerance this session and required several rest breaks and gentle encouragement for agitation. Pt continues to benefit from OT acutely. D/c recommendation remains appropriate.    Recommendations for follow up therapy are one component of a multi-disciplinary discharge planning process, led by the attending physician.  Recommendations may be updated based on patient status, additional functional criteria and insurance authorization.   Follow Up Recommendations  Acute inpatient rehab (3hours/day)    Assistance Recommended at Discharge Frequent or constant Supervision/Assistance  Functional Status Assessment     Equipment Recommendations  None recommended by OT       Precautions / Restrictions Precautions Precautions: Back Precaution Booklet Issued: No Precaution Comments: Reviewed back precautions Spinal Brace Comments: No brace per MD note Restrictions Weight Bearing Restrictions: No       Mobility Bed Mobility               General bed mobility comments: pt found on toilet, returned to recliner    Transfers Overall transfer level: Needs assistance Equipment used: Rolling walker (2 wheels) Transfers: Sit to/from Stand Sit to Stand: Mod assist           General transfer comment: pt requires verbal cues fro safe placement of BUE for sit<>stand transfers; pt attempts to pull up from RW. He gets very frustrated with himself if unable to stand on 1st or 2nd attempt      Balance Overall balance assessment: Needs assistance Sitting-balance support: Feet supported Sitting balance-Leahy Scale: Good     Standing balance support: Single extremity supported;During functional activity Standing balance-Leahy Scale: Poor                             ADL either performed or assessed with clinical judgement   ADL Overall ADL's : Needs assistance/impaired         Upper Body Bathing: Set up;Sitting Upper Body Bathing Details (indicate cue type and reason): bathed while sitting at the sink Lower Body Bathing: Maximal assistance;Sit to/from stand Lower Body Bathing Details (indicate cue type and reason): pt able to stand at the sink while therapist and his wife assisted with peri area bathing. Pt can bathe thighs to knees while sitting Upper Body Dressing : Set up;Sitting   Lower Body Dressing: Maximal assistance;Sit to/from stand Lower Body Dressing Details (indicate cue type and reason): max A to thread BLE, pt requires at least 1 UE supported externally in standing +assist for pulling undergarmets over hip Toilet Transfer:  Moderate assistance;Ambulation;Rolling walker (2 wheels) Toilet Transfer Details (indicate cue type and reason): mod A for verbal cues for placement and physical assist to boost into standing         Functional mobility during ADLs: Moderate assistance;Rolling walker (2 wheels) General ADL Comments: pt on toilet with RN and NT  upon arrival; mod A to stand from toilet and ambulate to sink wtih RW. ADL session at sink while sitting and standing, verbally reviewing all back precautions throughout.     Vision   Vision Assessment?: No apparent visual deficits            Pertinent Vitals/Pain Pain Assessment: Faces Faces Pain Scale: Hurts whole lot Pain Location: back Pain Descriptors / Indicators: Sharp;Jabbing Pain Intervention(s): Monitored during session;Limited activity within patient's tolerance     Hand Dominance     Extremity/Trunk Assessment Upper Extremity Assessment Upper Extremity Assessment: Overall WFL for tasks assessed   Lower Extremity Assessment Lower Extremity Assessment: Defer to PT evaluation       Communication     Cognition Arousal/Alertness: Awake/alert Behavior During Therapy: WFL for tasks assessed/performed Overall Cognitive Status: Within Functional Limits for tasks assessed                                 General Comments: pt slightly agitated, responds well to gentle encouragement     General Comments  VSS on RA, wife present and supportive throughout    Exercises     Shoulder Instructions       OT Goals(Current goals can be found in the care plan section) Acute Rehab OT Goals Patient Stated Goal: less pain OT Goal Formulation: With patient/family Time For Goal Achievement: 08/24/21 Potential to Achieve Goals: Good ADL Goals Pt Will Perform Lower Body Dressing: with min assist;with adaptive equipment;sit to/from stand Pt Will Transfer to Toilet: with min assist;stand pivot transfer;bedside commode Pt Will Perform Toileting - Clothing Manipulation and hygiene: with min assist;sit to/from stand;sitting/lateral leans Additional ADL Goal #1: Pt will perform log roll with Min Guard A in preparation for ADLs Additional ADL Goal #2: Pt will independently verbalize 3/3 back precautions  OT Frequency: Min 2X/week    AM-PAC OT "6 Clicks" Daily  Activity     Outcome Measure Help from another person eating meals?: A Little Help from another person taking care of personal grooming?: A Little Help from another person toileting, which includes using toliet, bedpan, or urinal?: A Lot Help from another person bathing (including washing, rinsing, drying)?: A Lot Help from another person to put on and taking off regular upper body clothing?: A Lot Help from another person to put on and taking off regular lower body clothing?: A Lot 6 Click Score: 14   End of Session Equipment Utilized During Treatment: Rolling walker (2 wheels);Gait belt Nurse Communication: Mobility status;Precautions  Activity Tolerance: Patient limited by pain Patient left: in bed;with call bell/phone within reach;with bed alarm set;with family/visitor present  OT Visit Diagnosis: Unsteadiness on feet (R26.81);Other abnormalities of gait and mobility (R26.89);Muscle weakness (generalized) (M62.81);Pain                Time: 6546-5035 OT Time Calculation (min): 17 min Charges:  OT General Charges $OT Visit: 1 Visit OT Treatments $Self Care/Home Management : 8-22 mins   Francine Hannan A Dodi Leu 08/14/2021, 2:54 PM

## 2021-08-14 NOTE — Care Management Important Message (Signed)
Important Message  Patient Details  Name: Keith [name] MRN: [id number] Date of Birth: 1951-04-12   Medicare Important Message Given:  Yes     [name] 08/14/2021, 3:32 PM

## 2021-08-15 NOTE — Progress Notes (Addendum)
Physical Therapy Treatment Patient Details Name: Keith [name] MRN: [id number] DOB: 04-06-1951 Today's Date: 08/15/2021   History of Present Illness 70 yo male presenting to ED on 11/1 with bilateral LE weakness. S/p T11-12 laminectomy, right T11-12 facetectomy, T11-T12 posterior spinal instrumented fusion with pedicle screw instrumentation and posterolateral fusion on 11/2. PMH including DM, HTN, obesity, PE, and scoliosis.    PT Comments    Pt received in supine and agreeable to therapy session after premedication. Pt making progress towards goals with notably less facial grimacing/moaning during short distance gait trials x2 session. Pt needing up to +2 modA for sit<>stand and gait progression with RW and chair follow. Pt with improved recall of precautions today, 3/3 at beginning of session with no hints given. Pt remains excellent candidate for CIR to address remaining deficits in strength and mobility as he is motivated to return to prior level of function. Pt continues to benefit from PT services to progress toward functional mobility goals.      Recommendations for follow up therapy are one component of a multi-disciplinary discharge planning process, led by the attending physician.  Recommendations may be updated based on patient status, additional functional criteria and insurance authorization.  Follow Up Recommendations  Acute inpatient rehab (3hours/day)     Assistance Recommended at Discharge Frequent or constant Supervision/Assistance  Equipment Recommendations  None recommended by PT    Recommendations for Other Services       Precautions / Restrictions Precautions Precautions: Back Precaution Booklet Issued: No Precaution Comments: Reviewed back precautions (Patient with 3/3 recall at beginning of session with no hints) Required Braces or Orthoses: Other Brace (L ankle) Spinal Brace Comments: No brace per MD note Other Brace: Utilized lace up ankle brace today  that pt brought from home Restrictions Weight Bearing Restrictions: No     Mobility  Bed Mobility Overal bed mobility: Needs Assistance Bed Mobility: Rolling;Sit to Sidelying Rolling: Min assist Sidelying to sit: Mod assist;+2 for safety/equipment (Assist for BLE management and trunk stability)     General bed mobility comments: Pt will sometimes move legs using UE, encouraged not to bend past 90 degrees    Transfers Overall transfer level: Needs assistance Equipment used: Rolling walker (2 wheels) Transfers: Sit to/from Stand Sit to Stand: Mod assist;+2 physical assistance    General transfer comment: modA +2 needed for power up and safe hand placement, utilized 1 hand on walker, 1 hand on knee, pt with improved power up today with less limitation from pain    Ambulation/Gait Ambulation/Gait assistance: Mod assist;+2 physical assistance Gait Distance (Feet): 10 Feet (10 ft, sitting rest break, 15 ft) Assistive device: Rolling walker (2 wheels) Gait Pattern/deviations: Step-through pattern;Decreased stride length;Trunk flexed Gait velocity: decreased     General Gait Details: Pt relies heavily on UE for balance and support, verbal cues needed for upright posture and reminders to not hold breath       Balance Overall balance assessment: Needs assistance Sitting-balance support: Feet supported;Bilateral upper extremity supported Sitting balance-Leahy Scale: Fair Sitting balance - Comments: reliant on UE support Postural control: Posterior lean Standing balance support: Bilateral upper extremity supported;During functional activity;Reliant on assistive device for balance Standing balance-Leahy Scale: Poor Standing balance comment: Reliant on BUE for balance      Cognition Arousal/Alertness: Awake/alert Behavior During Therapy: WFL for tasks assessed/performed Overall Cognitive Status: Within Functional Limits for tasks assessed     Exercises Other Exercises Other  Exercises: Supine: ankle pumps x5, SAQ x5    General  Comments General comments (skin integrity, edema, etc.): Incision site clean, dry, intact      Pertinent Vitals/Pain Pain Assessment: 0-10 Pain Score: 4  Pain Location: Back, particularly lower L side Pain Descriptors / Indicators: Sore;Stabbing;Throbbing;Moaning;Discomfort (Described as an "axe to my back") Pain Intervention(s): Monitored during session;Premedicated before session;Limited activity within patient's tolerance         Ice given at end of session, pt encouraged to keep on ~20-30 minutes at a time.   PT Goals (current goals can now be found in the care plan section) Acute Rehab PT Goals Patient Stated Goal: get my legs stronger PT Goal Formulation: With patient Time For Goal Achievement: 08/25/21 Progress towards PT goals: Progressing toward goals    Frequency    Min 5X/week      PT Plan Current plan remains appropriate       AM-PAC PT "6 Clicks" Mobility   Outcome Measure  Help needed turning from your back to your side while in a flat bed without using bedrails?: A Little Help needed moving from lying on your back to sitting on the side of a flat bed without using bedrails?: A Lot Help needed moving to and from a bed to a chair (including a wheelchair)?: A Lot Help needed standing up from a chair using your arms (e.g., wheelchair or bedside chair)?: A Lot Help needed to walk in hospital room?: A Lot Help needed climbing 3-5 steps with a railing? : Total 6 Click Score: 12    End of Session Equipment Utilized During Treatment: Gait belt Activity Tolerance: Patient tolerated treatment well;Patient limited by fatigue;Patient limited by pain Patient left: in chair;with chair alarm set;with call bell/phone within reach Nurse Communication: Mobility status PT Visit Diagnosis: Other abnormalities of gait and mobility (R26.89);Muscle weakness (generalized) (M62.81)     Time: 8811-0315 PT Time Calculation  (min) (ACUTE ONLY): 25 min  Charges:  $Gait Training: 8-22 mins $Therapeutic Activity: 8-22 mins                     Evelene Croon, Student PTA CI: Carly P., PTA  Carly M Poff 08/15/2021, 1:56 PM

## 2021-08-15 NOTE — Plan of Care (Signed)
Pt was in a good mood and we were consistent with his pain med schedule. PT stated he did a better job today than yesterday.   Problem: Education: Goal: Knowledge of General Education information will improve Description: Including pain rating scale, medication(s)/side effects and non-pharmacologic comfort measures Outcome: Progressing   Problem: Health Behavior/Discharge Planning: Goal: Ability to manage health-related needs will improve Outcome: Progressing   Problem: Clinical Measurements: Goal: Ability to maintain clinical measurements within normal limits will improve Outcome: Progressing Goal: Will remain free from infection Outcome: Progressing Goal: Diagnostic test results will improve Outcome: Progressing Goal: Respiratory complications will improve Outcome: Progressing Goal: Cardiovascular complication will be avoided Outcome: Progressing   Problem: Activity: Goal: Risk for activity intolerance will decrease Outcome: Progressing   Problem: Nutrition: Goal: Adequate nutrition will be maintained Outcome: Progressing   Problem: Coping: Goal: Level of anxiety will decrease Outcome: Progressing   Problem: Elimination: Goal: Will not experience complications related to bowel motility Outcome: Progressing Goal: Will not experience complications related to urinary retention Outcome: Progressing   Problem: Pain Managment: Goal: General experience of comfort will improve Outcome: Progressing   Problem: Safety: Goal: Ability to remain free from injury will improve Outcome: Progressing   Problem: Skin Integrity: Goal: Risk for impaired skin integrity will decrease Outcome: Progressing

## 2021-08-15 NOTE — Progress Notes (Signed)
Inpatient Rehab Admissions Coordinator:   Following for my colleague, Gayland Curry.  Awaiting determination from [facility] Medicare regarding prior auth request for CIR.   Shann Medal, PT, DPT Admissions Coordinator [phone] 08/15/21  10:44 AM

## 2021-08-15 NOTE — Progress Notes (Signed)
Inpatient Rehab Admissions Coordinator:   Notified by Bernadene Bell case manager of request for peer to peer.  I let Dr. Zada Finders and [facility] team know.  Shann Medal, PT, DPT Admissions Coordinator [phone] 08/15/21  2:18 PM

## 2021-08-15 NOTE — Progress Notes (Signed)
Neurosurgery Service Progress Note  Subjective: No acute events overnight, incisional pain otherwise doing well with therapies  Objective: Vitals:   08/15/21 0058 08/15/21 0157 08/15/21 0445 08/15/21 1140  BP:   130/80 121/72  Pulse:   73 83  Resp:   18 20  Temp:   98 F (36.7 C) 98.4 F (36.9 C)  TempSrc:   Oral Oral  SpO2: 98% 98% 98% 98%  Weight:      Height:        Physical Exam: Strength 5/5 in BUE, BLE are diffusely 3/5 bilaterally proximally and 4-/5 distally, normalized reflexes and no ankle clonus, mild T12 sensory level Incision c/d/i  Assessment & Plan: 70 y.o. man w/ recurrent thoracic myelopathy in the setting of degenerative scoliosis, MRI with recurrent stenosis at T11-12 with worsening cord signal change. 11/2 s/p T11-12 repeat decompression and PSIF. 11/3 XR T-spine hardware in place, difficult to evaluate in detail 2/2 habitus  -PT/OT, great CIR candidate given how far he's off his baseline and potential for improvement, insurance authorization still pending -SCDs, TEDs, SQH -activity / diet as tolerated, no brace / restrictions needed  Judith Part  08/15/21 11:48 AM

## 2021-08-16 NOTE — Progress Notes (Addendum)
Physical Therapy Treatment Patient Details Name: Keith [name] MRN: [id number] DOB: [birth year] Today's Date: 08/16/2021   History of Present Illness 70 yo male presenting to ED on 11/1 with bilateral LE weakness. S/p T11-12 laminectomy, right T11-12 facetectomy, T11-T12 posterior spinal instrumented fusion with pedicle screw instrumentation and posterolateral fusion on 11/2. PMH including DM, HTN, obesity, PE, and scoliosis.    PT Comments    Pt received in supine and agreeable to therapy session after premedication. Pt progressing towards goals with improved short distance gait trials to hallway with RW and chair follow. Pt needing up to modA +2 for sit<>stand for power up as pt is still pausing, inhibiting stability and safety. Pt stated goal to try and progress to ambulating to nurses station next session. Pt remains a good candidate for CIR and reports 7/10 modified RPE (fatigue) at end of session. Pt continues to benefit from PT services to progress toward functional mobility goals.      Recommendations for follow up therapy are one component of a multi-disciplinary discharge planning process, led by the attending physician.  Recommendations may be updated based on patient status, additional functional criteria and insurance authorization.  Follow Up Recommendations  Acute inpatient rehab (3hours/day)     Assistance Recommended at Discharge Frequent or constant Supervision/Assistance  Equipment Recommendations  None recommended by PT    Recommendations for Other Services       Precautions / Restrictions Precautions Precautions: Back Precaution Booklet Issued: No Precaution Comments: Reviewed back precautions (Patient with 3/3 recall at beginning of session with no hints) Required Braces or Orthoses: Other Brace (L ankle) Spinal Brace Comments: No brace per MD note Other Brace: Utilized lace up ankle brace today that pt brought from home Restrictions Weight Bearing  Restrictions: No     Mobility  Bed Mobility Overal bed mobility: Needs Assistance Bed Mobility: Rolling;Sit to Sidelying Rolling: Min assist Sidelying to sit: Mod assist;+2 for safety/equipment;Min assist (Assist for BLE management and trunk stability)      General bed mobility comments: Pt with less grimacing/moaning with bed mobility    Transfers Overall transfer level: Needs assistance Equipment used: Rolling walker (2 wheels) Transfers: Sit to/from Stand Sit to Stand: Mod assist;+2 physical assistance   General transfer comment: modA +2 needed for power up and safe hand placement, pt still pausing right before straightening up, encouraged to get trunk upright as soon as possible upon standing for stability and safety    Ambulation/Gait Ambulation/Gait assistance: Mod assist;+2 safety/equipment (+2 for chair follow) Gait Distance (Feet): 25 Feet (25 ft. sitting rest break, 25 ft) Assistive device: Rolling walker (2 wheels) Gait Pattern/deviations: Step-through pattern;Decreased stride length;Trunk flexed Gait velocity: decreased     General Gait Details: Pt relies heavily on UE for balance and support, verbal cues needed for upright posture, chair follow       Balance Overall balance assessment: Needs assistance Sitting-balance support: Feet supported;Bilateral upper extremity supported Sitting balance-Leahy Scale: Fair Sitting balance - Comments: reliant on UE support Postural control: Posterior lean Standing balance support: Bilateral upper extremity supported;During functional activity;Reliant on assistive device for balance Standing balance-Leahy Scale: Poor Standing balance comment: Reliant on BUE for balance      Cognition Arousal/Alertness: Awake/alert Behavior During Therapy: WFL for tasks assessed/performed Overall Cognitive Status: Within Functional Limits for tasks assessed       Exercises Other Exercises Other Exercises: Supine: ankle pumps x5, SAQ  x5    General Comments General comments (skin integrity, edema, etc.): Incision site  clean, dry, and intact; VSS on RA      Pertinent Vitals/Pain Pain Assessment: 0-10 Pain Score: 4  Pain Location: Back, particularly lower L side Pain Descriptors / Indicators: Sore;Stabbing;Throbbing;Moaning;Discomfort (Described as an "axe to my back") Pain Intervention(s): Monitored during session;Ice applied;Premedicated before session;Limited activity within patient's tolerance     PT Goals (current goals can now be found in the care plan section) Acute Rehab PT Goals Patient Stated Goal: get my legs stronger PT Goal Formulation: With patient Time For Goal Achievement: 08/25/21 Progress towards PT goals: Progressing toward goals    Frequency    Min 5X/week      PT Plan Current plan remains appropriate       AM-PAC PT "6 Clicks" Mobility   Outcome Measure  Help needed turning from your back to your side while in a flat bed without using bedrails?: A Little Help needed moving from lying on your back to sitting on the side of a flat bed without using bedrails?: A Lot Help needed moving to and from a bed to a chair (including a wheelchair)?: A Lot Help needed standing up from a chair using your arms (e.g., wheelchair or bedside chair)?: A Lot Help needed to walk in hospital room?: A Lot Help needed climbing 3-5 steps with a railing? : Total 6 Click Score: 12    End of Session Equipment Utilized During Treatment: Gait belt Activity Tolerance: Patient tolerated treatment well;Patient limited by fatigue;Patient limited by pain Patient left: in chair;with chair alarm set;with call bell/phone within reach Nurse Communication: Mobility status PT Visit Diagnosis: Other abnormalities of gait and mobility (R26.89);Muscle weakness (generalized) (M62.81)     Time: 1610-9604 PT Time Calculation (min) (ACUTE ONLY): 29 min  Charges:  $Gait Training: 8-22 mins $Therapeutic Activity: 8-22  mins                     Evelene Croon, Student PTA CI: Carly P., PTA  Carly M Poff 08/16/2021, 2:30 PM

## 2021-08-16 NOTE — Progress Notes (Signed)
   Providing Compassionate, Quality Care - Together  NEUROSURGERY PROGRESS NOTE   S: No issues overnight. Ready for rehab  O: EXAM:  BP 107/65 (BP Location: Right Arm)   Pulse 74   Temp 97.9 F (36.6 C) (Oral)   Resp 18   Ht 6' (1.829 m)   Wt 128.3 kg   SpO2 97%   BMI 38.36 kg/m   Awake, alert, oriented x3 PERRL Speech fluent, appropriate  CNs grossly intact  5/5 BUE 3/5 BLE HF, 4-/5 DF/PF  ASSESSMENT:  70 y.o. male with   T11-12 stenosis with myelopathy  -Status post T11-12 decompression with instrumentation and fusion  PLAN: -Rehab pending -Continue pain control, DVT prophylaxis     Thank you for allowing me to participate in this patient's care.  Please do not hesitate to call with questions or concerns.   Elwin Sleight, [facility] Cell: [phone]

## 2021-08-17 NOTE — TOC Progression Note (Signed)
Transition of Care [facility]) - Progression Note    Patient Details  Name: Keith [name] MRN: [id number] Date of Birth: 14-Sep-1951  Transition of Care [facility]) CM/SW Contact  Joanne Chars, LCSW Phone Number: 08/17/2021, 11:50 AM  Clinical Narrative:   CSW noted pt CIR auth denied and now on appeal.  CSW spoke with pt regarding other alternative if appeal is denied.  Pt declines SNF option and also declines HH.  Pt tried [facility] before and did not find it helpful.  Pt has done outpt PT at [facility] [place] in the past and would want to return there.  Pt reports wife can transport him.  TOC will continue to follow.     Expected Discharge Plan: Home/Self Care Barriers to Discharge: Continued Medical Work up  Expected Discharge Plan and Services Expected Discharge Plan: Home/Self Care   Discharge Planning Services: CM Consult   Living arrangements for the past 2 months: Single Family Home                                       Social Determinants of Health (SDOH) Interventions    Readmission Risk Interventions No flowsheet data found.

## 2021-08-17 NOTE — Progress Notes (Signed)
Inpatient Rehab Admissions Coordinator:    Received late voicemail that request for CIR has been denied.  Offered opportunity to complete expedited appeal, which I think is appropriate, and pt would like to pursue.  I will start this today and we should expect to hear an answer within 72 hours.   Shann Medal, PT, DPT Admissions Coordinator [phone] 08/17/21  10:59 AM

## 2021-08-17 NOTE — Progress Notes (Signed)
   Providing Compassionate, Quality Care - Together  NEUROSURGERY PROGRESS NOTE   S: No issues overnight.   O: EXAM:  BP 117/77 (BP Location: Right Arm)   Pulse 83   Temp 97.7 F (36.5 C) (Oral)   Resp 18   Ht 6' (1.829 m)   Wt 128.3 kg   SpO2 100%   BMI 38.36 kg/m   Awake, alert, oriented x3 PERRL Speech fluent, appropriate  CNs grossly intact  5/5 BUE 3/5 BLE HF, 4-/5 DF/PF   ASSESSMENT:  70 y.o. male with    T11-12 stenosis with myelopathy   -Status post T11-12 decompression with instrumentation and fusion   PLAN: -Rehab pending -Continue pain control, DVT prophylaxis    Thank you for allowing me to participate in this patient's care.  Please do not hesitate to call with questions or concerns.   Elwin Sleight, [facility] Cell: [phone]

## 2021-08-17 NOTE — Progress Notes (Signed)
Occupational Therapy Treatment Patient Details Name: Keith [name] MRN: [id number] DOB: Jul 16, 1951 Today's Date: 08/17/2021   History of present illness 70 yo male presenting to ED on 11/1 with bilateral LE weakness. S/p T11-12 laminectomy, right T11-12 facetectomy, T11-T12 posterior spinal instrumented fusion with pedicle screw instrumentation and posterolateral fusion on 11/2. PMH including DM, HTN, obesity, PE, and scoliosis.   OT comments  Pt continues to present with high motivation to participate in therapy. Pt donning ankle brace and shoes with Mod A; using figure four method. Pt performing functional mobility to sink to complete grooming with Min A. Maintaining standing to complete oral car with Min A for balance and then sitting to complete face hygiene. Pt continues to present with decreased balance, strength, and activity tolerance impacting his functional performance. Continue to highly recommend dc to CIR for intensive OT and will continue to follow acutely as admitted.     Recommendations for follow up therapy are one component of a multi-disciplinary discharge planning process, led by the attending physician.  Recommendations may be updated based on patient status, additional functional criteria and insurance authorization.    Follow Up Recommendations  Acute inpatient rehab (3hours/day)    Assistance Recommended at Discharge Frequent or constant Supervision/Assistance  Equipment Recommendations  None recommended by OT    Recommendations for Other Services Rehab consult;PT consult    Precautions / Restrictions Precautions Precautions: Back Precaution Booklet Issued: No Precaution Comments: Reviewed back precautions (recall 3/3 precautions at beginning of session) Required Braces or Orthoses: Other Brace (L ankle) Spinal Brace Comments: No brace per MD note Other Brace: Utilized lace up ankle brace today that pt brought from home       Mobility Bed Mobility Overal  bed mobility: Needs Assistance Bed Mobility: Rolling;Sit to Sidelying Rolling: Min assist Sidelying to sit: Min assist       General bed mobility comments: Min A for maintaining hip position while performing log rolll    Transfers Overall transfer level: Needs assistance Equipment used: Rolling walker (2 wheels) Transfers: Sit to/from Stand Sit to Stand: Min assist;From elevated surface           General transfer comment: Min A for power up and weight shift forward. Significant effort and time to achieve upright posture     Balance Overall balance assessment: Needs assistance Sitting-balance support: Feet supported;Bilateral upper extremity supported Sitting balance-Leahy Scale: Fair     Standing balance support: Bilateral upper extremity supported;During functional activity;Reliant on assistive device for balance;Single extremity supported Standing balance-Leahy Scale: Poor Standing balance comment: Reliant on BUE for balance                           ADL either performed or assessed with clinical judgement   ADL Overall ADL's : Needs assistance/impaired     Grooming: Oral care;Minimal assistance;Standing;Wash/dry face;Min guard;Sitting Grooming Details (indicate cue type and reason): Pt performing oral care while standing at sink with Min A for balance. Pt requiring significant effort and maintaining UE support due to decreased balance. Sitting at [facility] for rest break. Performing face hygiene while seated             Lower Body Dressing: Moderate assistance;Sit to/from stand Lower Body Dressing Details (indicate cue type and reason): Pt able to bring ankles to knees for donning of shoes. Requiring assistance to tie and wrap L ankle brace after pt donned over foot. Assistance for managing back of R shoe Toilet Transfer:  Minimal assistance;Ambulation;BSC/3in1;Rolling walker (2 wheels) Toilet Transfer Details (indicate cue type and reason): Min A for power up  and gaining balance. Very slow and requiring significant effort. But very motivated despite weakness and pain   Toileting - Clothing Manipulation Details (indicate cue type and reason): Pt managing urinal at EOB with increased time     Functional mobility during ADLs: Minimal assistance;Rolling walker (2 wheels) General ADL Comments: Very slow and requiring significant effort. But very motivated despite weakness and pain    Extremity/Trunk Assessment Upper Extremity Assessment Upper Extremity Assessment: Overall WFL for tasks assessed   Lower Extremity Assessment Lower Extremity Assessment: Defer to PT evaluation        Vision       Perception     Praxis      Cognition Arousal/Alertness: Awake/alert Behavior During Therapy: WFL for tasks assessed/performed Overall Cognitive Status: Within Functional Limits for tasks assessed                                 General Comments: Very motivated          Exercises     Shoulder Instructions       General Comments VSS    Pertinent Vitals/ Pain       Pain Assessment: Faces Faces Pain Scale: Hurts little more Pain Location: Back, particularly lower L side Pain Descriptors / Indicators: Sore;Throbbing;Moaning;Discomfort Pain Intervention(s): Monitored during session;Limited activity within patient's tolerance;Repositioned  Home Living                                          Prior Functioning/Environment              Frequency  Min 2X/week        Progress Toward Goals  OT Goals(current goals can now be found in the care plan section)  Progress towards OT goals: Progressing toward goals  Acute Rehab OT Goals Patient Stated Goal: Go to rehab OT Goal Formulation: With patient/family Time For Goal Achievement: 08/24/21 Potential to Achieve Goals: Good ADL Goals Pt Will Perform Lower Body Dressing: with min assist;with adaptive equipment;sit to/from stand Pt Will Transfer  to Toilet: with min assist;stand pivot transfer;bedside commode Pt Will Perform Toileting - Clothing Manipulation and hygiene: with min assist;sit to/from stand;sitting/lateral leans Additional ADL Goal #1: Pt will perform log roll with Min Guard A in preparation for ADLs Additional ADL Goal #2: Pt will independently verbalize 3/3 back precautions  Plan Discharge plan remains appropriate    Co-evaluation                 AM-PAC OT "6 Clicks" Daily Activity     Outcome Measure   Help from another person eating meals?: A Little Help from another person taking care of personal grooming?: A Little Help from another person toileting, which includes using toliet, bedpan, or urinal?: A Lot Help from another person bathing (including washing, rinsing, drying)?: A Lot Help from another person to put on and taking off regular upper body clothing?: A Lot Help from another person to put on and taking off regular lower body clothing?: A Lot 6 Click Score: 14    End of Session Equipment Utilized During Treatment: Rolling walker (2 wheels);Gait belt  OT Visit Diagnosis: Unsteadiness on feet (R26.81);Other abnormalities of gait and mobility (R26.89);Muscle weakness (generalized) (  M62.81);Pain Pain - Right/Left: Left Pain - part of body:  (Back)   Activity Tolerance Patient tolerated treatment well   Patient Left in chair;with call bell/phone within reach;with chair alarm set   Nurse Communication Mobility status;Precautions        Time: 0802-2336 OT Time Calculation (min): 17 min  Charges: OT General Charges $OT Visit: 1 Visit OT Treatments $Self Care/Home Management : 8-22 mins  [place], OTR/L Acute Rehab Pager: [phone] Office: [place] 08/17/2021, 10:16 AM

## 2021-08-17 NOTE — Progress Notes (Signed)
Physical Therapy Treatment Patient Details Name: Keith [name] MRN: [id number] DOB: 05-Aug-1951 Today's Date: 08/17/2021   History of Present Illness 70 yo male presenting to ED on 11/1 with bilateral LE weakness. S/p T11-12 laminectomy, right T11-12 facetectomy, T11-T12 posterior spinal instrumented fusion with pedicle screw instrumentation and posterolateral fusion on 11/2. PMH including DM, HTN, obesity, PE, and scoliosis.    PT Comments    Patient progressing with ambulation distance.  Still painful and heavily reliant on UE support due to pain/LE weakness and fatigue, but pt motivated and tolerating better with pre-med this session.  Patient will continue to benefit from skilled PT in the acute setting and from follow up acute inpatient rehab at d/c.    Recommendations for follow up therapy are one component of a multi-disciplinary discharge planning process, led by the attending physician.  Recommendations may be updated based on patient status, additional functional criteria and insurance authorization.  Follow Up Recommendations  Acute inpatient rehab (3hours/day)     Assistance Recommended at Discharge Frequent or constant Supervision/Assistance  Equipment Recommendations  None recommended by PT    Recommendations for Other Services       Precautions / Restrictions Precautions Precautions: Fall;Back Required Braces or Orthoses: Other Brace (L ankle ASO) Spinal Brace Comments: No brace per MD note Other Brace: Utilized lace up ankle brace today that pt brought from home     Mobility  Bed Mobility               General bed mobility comments: in recliner    Transfers Overall transfer level: Needs assistance Equipment used: Rolling walker (2 wheels) Transfers: Sit to/from Stand Sit to Stand: Mod assist           General transfer comment: increased time up from recliner pushing heavily with UE's then time to transition hands to RW     Ambulation/Gait Ambulation/Gait assistance: Min assist;+2 safety/equipment Gait Distance (Feet): 60 Feet (x 2) Assistive device: Rolling walker (2 wheels) Gait Pattern/deviations: Step-through pattern;Decreased stride length;Shuffle;Decreased dorsiflexion - right;Wide base of support;Trunk flexed       General Gait Details: Heavy UE support on walker, assist for balance, chair follow due to LE fatigue/weakness/pain, L foot clearing barely with steps, R scuffing the floor; sat to rest about 4 minutes prior to standing to return to room   Stairs             Wheelchair Mobility    Modified Rankin (Stroke Patients Only)       Balance Overall balance assessment: Needs assistance Sitting-balance support: Feet supported Sitting balance-Leahy Scale: Fair     Standing balance support: Bilateral upper extremity supported Standing balance-Leahy Scale: Poor Standing balance comment: Reliant on BUE for balance                            Cognition Arousal/Alertness: Awake/alert Behavior During Therapy: WFL for tasks assessed/performed Overall Cognitive Status: Within Functional Limits for tasks assessed                                          Exercises      General Comments        Pertinent Vitals/Pain Pain Assessment: Faces Faces Pain Scale: Hurts little more Pain Location: Back, particularly lower L side Pain Descriptors / Indicators: Sore;Throbbing;Moaning;Discomfort Pain Intervention(s): Monitored during session;Repositioned  Home Living                          Prior Function            PT Goals (current goals can now be found in the care plan section) Progress towards PT goals: Progressing toward goals    Frequency    Min 5X/week      PT Plan Current plan remains appropriate    Co-evaluation              AM-PAC PT "6 Clicks" Mobility   Outcome Measure  Help needed turning from your back to  your side while in a flat bed without using bedrails?: A Little Help needed moving from lying on your back to sitting on the side of a flat bed without using bedrails?: A Lot Help needed moving to and from a bed to a chair (including a wheelchair)?: A Lot Help needed standing up from a chair using your arms (e.g., wheelchair or bedside chair)?: A Lot Help needed to walk in hospital room?: A Lot Help needed climbing 3-5 steps with a railing? : Total 6 Click Score: 12    End of Session Equipment Utilized During Treatment: Gait belt;Other (comment) (ASO) Activity Tolerance: Patient tolerated treatment well;Patient limited by fatigue Patient left: in chair;with call bell/phone within reach;with chair alarm set   PT Visit Diagnosis: Other abnormalities of gait and mobility (R26.89);Muscle weakness (generalized) (M62.81)     Time: 7408-1448 PT Time Calculation (min) (ACUTE ONLY): 17 min  Charges:  $Gait Training: 8-22 mins                     Magda Kiel, PT Acute Rehabilitation Services Pager:[phone] Office:[phone] 08/17/2021    [name] 08/17/2021, 2:15 PM

## 2021-08-18 NOTE — Progress Notes (Signed)
   Providing Compassionate, Quality Care - Together  NEUROSURGERY PROGRESS NOTE   S: No issues overnight. Happy because rehab was able to accept him  O: EXAM:  BP 121/86 (BP Location: Left Arm)   Pulse 86   Temp 98 F (36.7 C) (Oral)   Resp 20   Ht 6' (1.829 m)   Wt 128.3 kg   SpO2 98%   BMI 38.36 kg/m   Awake, alert, oriented x3 PERRL Speech fluent, appropriate  CNs grossly intact  5/5 BUE 3/5 BLE HF, 4-/5 DF/PF   ASSESSMENT:  70 y.o. male with    T11-12 stenosis with myelopathy   -Status post T11-12 decompression with instrumentation and fusion   PLAN: -Rehab pending, CIR accepted -Continue pain control, DVT prophylaxis       Thank you for allowing me to participate in this patient's care.  Please do not hesitate to call with questions or concerns.   Elwin Sleight, [facility] Cell: [phone]

## 2021-08-18 NOTE — Progress Notes (Addendum)
Physical Therapy Treatment Patient Details Name: SHANNON KIRKENDALL MRN: [id number] DOB: Mar 18, 1951 Today's Date: 08/18/2021   History of Present Illness 70 yo male presenting to ED on 11/1 with bilateral LE weakness. S/p T11-12 laminectomy, right T11-12 facetectomy, T11-T12 posterior spinal instrumented fusion with pedicle screw instrumentation and posterolateral fusion on 11/2. PMH including DM, HTN, obesity, PE, and scoliosis.    PT Comments    Pt received in supine and agreeable to therapy session. Pt continues to demonstrate limitations performing household distance gait tasks due to weakness of BLE, fatigue in arms due to heavy use of UE on RW, and high pain in lower left side of back. Emphasis on increasing step length, upright posture, and pursed-lip breathing techniques. Visual goals helpful to increase gait distance. Continue to recommend CIR upon DC to address pts remaining deficits in strength, mobility, and function. Pt reported 6/10 modified RPE during session. Pt continues to benefit from PT services to progress toward functional mobility goals.      Recommendations for follow up therapy are one component of a multi-disciplinary discharge planning process, led by the attending physician.  Recommendations may be updated based on patient status, additional functional criteria and insurance authorization.  Follow Up Recommendations  Acute inpatient rehab (3hours/day)     Assistance Recommended at Discharge Frequent or constant Supervision/Assistance  Equipment Recommendations  None recommended by PT    Recommendations for Other Services       Precautions / Restrictions Precautions Precautions: Back Precaution Booklet Issued: No Required Braces or Orthoses: Other Brace (L ankle) Spinal Brace Comments: No brace per MD note Other Brace: Utilized lace up ankle brace today that pt brought from home Restrictions Weight Bearing Restrictions: No     Mobility  Bed  Mobility Overal bed mobility: Needs Assistance Bed Mobility: Rolling;Sit to Sidelying Rolling: Min guard Sidelying to sit: Min guard (No physical assist given)  General bed mobility comments: Pt will sometimes move legs using UE, encouraged not to bend past 90 degrees otherwise no cues needed    Transfers Overall transfer level: Needs assistance Equipment used: Rolling walker (2 wheels) Transfers: Sit to/from Stand Sit to Stand: Mod assist;+2 physical assistance  General transfer comment: First attempt modA +1, second attempt modA +2 for power up due to LE weakness (particularly Lt) and pain    Ambulation/Gait Ambulation/Gait assistance: +2 safety/equipment;Min assist (Chair follow) Gait Distance (Feet): 40 Feet (40 ft, seated break, 40 ft) Assistive device: Rolling walker (2 wheels) Gait Pattern/deviations: Step-through pattern;Decreased stride length;Trunk flexed Gait velocity: decreased     General Gait Details: Pt relies heavily on UE for balance and support, verbal cues for upright posture and increased step length. Pt states he feels his limitation with gait distance is weakness of the LLE, fatigue in arms, and pain in lower left side of back        Balance Overall balance assessment: Needs assistance Sitting-balance support: Feet supported;Bilateral upper extremity supported Sitting balance-Leahy Scale: Fair Sitting balance - Comments: reliant on UE support Postural control: Posterior lean Standing balance support: Bilateral upper extremity supported;During functional activity;Reliant on assistive device for balance Standing balance-Leahy Scale: Poor Standing balance comment: Reliant on BUE for balance      Cognition Arousal/Alertness: Awake/alert Behavior During Therapy: WFL for tasks assessed/performed Overall Cognitive Status: Within Functional Limits for tasks assessed   General Comments: Very motivated      Exercises Other Exercises Other Exercises:  Supine: ankle pumps x5, heel slides x5, hip abduction x5, hamstring sets in  supine a few reps, STS as TE for BLE strengthening    General Comments General comments (skin integrity, edema, etc.): VSS on RA, after first gait task HR 96, SPo2 100%, BP 127/85; HEP given and educated on exercises and pt verbalized understanding. Medbridge access code: [email or web address]      Pertinent Vitals/Pain Pain Assessment: 0-10 Pain Score: 4  (4/10 beginning of session, 6/10 during ambulation, 5/10 end of session; pt had only PO meds before session, whereas yesterday he had IV meds before session) Pain Location: Back, particularly lower L side Pain Descriptors / Indicators: Sore;Stabbing;Throbbing;Moaning;Discomfort (Pt stated it feels like a "pulled muscle") Pain Intervention(s): Monitored during session;Limited activity within patient's tolerance;Premedicated before session;Repositioned;Ice applied     PT Goals (current goals can now be found in the care plan section) Acute Rehab PT Goals Patient Stated Goal: get my legs stronger PT Goal Formulation: With patient Time For Goal Achievement: 08/25/21 Progress towards PT goals: Progressing toward goals    Frequency    Min 5X/week      PT Plan Current plan remains appropriate       AM-PAC PT "6 Clicks" Mobility   Outcome Measure  Help needed turning from your back to your side while in a flat bed without using bedrails?: A Little Help needed moving from lying on your back to sitting on the side of a flat bed without using bedrails?: A Little Help needed moving to and from a bed to a chair (including a wheelchair)?: A Lot Help needed standing up from a chair using your arms (e.g., wheelchair or bedside chair)?: A Lot Help needed to walk in hospital room?: A Lot Help needed climbing 3-5 steps with a railing? : Total 6 Click Score: 13    End of Session Equipment Utilized During Treatment: Gait belt Activity Tolerance: Patient tolerated treatment  well;Patient limited by fatigue;Patient limited by pain Patient left: in chair;with chair alarm set;with call bell/phone within reach Nurse Communication: Mobility status PT Visit Diagnosis: Other abnormalities of gait and mobility (R26.89);Muscle weakness (generalized) (M62.81)     Time: 7939-0300 PT Time Calculation (min) (ACUTE ONLY): 35 min  Charges:  $Gait Training: 8-22 mins $Therapeutic Exercise: 8-22 mins                     Evelene Croon, Student PTA CI: Carly P., PTA  Evelene Croon 08/18/2021, 11:57 AM

## 2021-08-19 NOTE — Progress Notes (Signed)
Subjective: Patient reports continued chest wall pain, similar to prior to surgery but mild in severity  Objective: Vital signs in last 24 hours: Temp:  [97.6 F (36.4 C)-98.6 F (37 C)] 97.7 F (36.5 C) (11/12 0757) Pulse Rate:  [80-88] 80 (11/12 0757) Resp:  [14-20] 14 (11/12 0757) BP: (95-121)/(67-86) 118/78 (11/12 0757) SpO2:  [96 %-98 %] 98 % (11/12 0757)  Intake/Output from previous day: 11/11 0701 - 11/12 0700 In: 353 [P.O.:353] Out: 550 [Urine:550] Intake/Output this shift: Total I/O In: -  Out: 100 [Urine:100]  Awake, alert, [name] 2/5 HF bilaterally, 4/5 DF, PF bilaterally  Lab Results: No results for input(s): WBC, HGB, HCT, PLT in the last 72 hours. BMET No results for input(s): NA, K, CL, CO2, GLUCOSE, BUN, CREATININE, CALCIUM in the last 72 hours.  Studies/Results: No results found.  Assessment/Plan: S/p T11-12 decompression and fusion  LOS: 10 days  - awaiting CIR   Vallarie Mare 08/19/2021, 10:36 AM

## 2021-08-19 NOTE — Progress Notes (Signed)
Honeycomb dressing changed, site is clean and dry, no drainage, swelling or pain noted.

## 2021-08-19 NOTE — Progress Notes (Signed)
Physical Therapy Treatment Patient Details Name: Keith [name] MRN: [id number] DOB: [birth year] Today's Date: 08/19/2021   History of Present Illness 70 yo male presenting to ED on 11/1 with bilateral LE weakness. S/p T11-12 laminectomy, right T11-12 facetectomy, T11-T12 posterior spinal instrumented fusion with pedicle screw instrumentation and posterolateral fusion on 11/2. PMH including DM, HTN, obesity, PE, and scoliosis.    PT Comments    Pt received in supine, agreeable to therapy session after premedication and with good tolerance for bed mobility, transfer training and emphasis today on supine/seated LE exercises. Pt needing AAROM for most LE exercises due to BLE weakness and decreased ROM, especially for hip abduction exercise bilaterally. Pt able to stand from elevated height bed with only +1 modA and needing +1 assist for pivotal steps at bedside with RW support. Good carryover of back precautions. Plan to progress hallway gait training next session with chair follow for safety. Pt continues to benefit from PT services to progress toward functional mobility goals.    Recommendations for follow up therapy are one component of a multi-disciplinary discharge planning process, led by the attending physician.  Recommendations may be updated based on patient status, additional functional criteria and insurance authorization.  Follow Up Recommendations  Acute inpatient rehab (3hours/day)     Assistance Recommended at Discharge Frequent or constant Supervision/Assistance  Equipment Recommendations  None recommended by PT    Recommendations for Other Services       Precautions / Restrictions Precautions Precautions: Back Precaution Booklet Issued: No Precaution Comments: 3/3 recalled Required Braces or Orthoses: Other Brace (L ankle lace up brace) Spinal Brace Comments: No brace per MD note Restrictions Weight Bearing Restrictions: No     Mobility  Bed Mobility Overal bed  mobility: Needs Assistance Bed Mobility: Rolling;Sit to Sidelying Rolling: Min guard Sidelying to sit: Min assist       General bed mobility comments: cues for proper body mechanics at times, minA for BLE to advance safely to EOB but pt with good initiation/use of railing for trunk raise    Transfers Overall transfer level: Needs assistance Equipment used: Rolling walker (2 wheels) Transfers: Sit to/from Stand;Bed to chair/wheelchair/BSC Sit to Stand: Mod assist;From elevated surface     Step pivot transfers: Min assist     General transfer comment: able to stand with +1 assist from elevated bed height, second person present but not needing to assist to lift; minA for stepping around to chair ~71ft with RW    Ambulation/Gait               General Gait Details: defer hallway gait progression due to lack of +2 assist at time of session and pt requesting to eat dinner, wants to walk to bathroom later in day after his meal.   Stairs             Wheelchair Mobility    Modified Rankin (Stroke Patients Only)       Balance Overall balance assessment: Needs assistance Sitting-balance support: Feet supported;Bilateral upper extremity supported Sitting balance-Leahy Scale: Fair Sitting balance - Comments: reliant on UE support Postural control: Posterior lean Standing balance support: Bilateral upper extremity supported;During functional activity;Reliant on assistive device for balance Standing balance-Leahy Scale: Poor Standing balance comment: Reliant on BUE for balance             Cognition Arousal/Alertness: Awake/alert Behavior During Therapy: WFL for tasks assessed/performed Overall Cognitive Status: Within Functional Limits for tasks assessed  General Comments: Very motivated, good participation        Exercises Other Exercises Other Exercises: Supine AAROM: ankle pumps (AROM), hip abduction, heel slides, SAQ, hamstring  sets x10 reps ea Other Exercises: Seated AROM: LAQ x5    General Comments General comments (skin integrity, edema, etc.): VSS on RA, no acute s/sx distress, cues for PLB during therex; RN notified pt honeycomb dressing had been lifted up by MD earlier in day and is still out of place, RN entered room to replace dressing while pt seated EOB and no new drainage observed.      Pertinent Vitals/Pain Pain Assessment: Faces Faces Pain Scale: Hurts little more Pain Location: Back, particularly lower L side Pain Descriptors / Indicators: Sore;Discomfort Pain Intervention(s): Limited activity within patient's tolerance;Monitored during session;Premedicated before session;Repositioned;Ice applied     PT Goals (current goals can now be found in the care plan section) Acute Rehab PT Goals Patient Stated Goal: get my legs stronger PT Goal Formulation: With patient Time For Goal Achievement: 08/25/21 Progress towards PT goals: Progressing toward goals    Frequency    Min 5X/week      PT Plan Current plan remains appropriate       AM-PAC PT "6 Clicks" Mobility   Outcome Measure  Help needed turning from your back to your side while in a flat bed without using bedrails?: A Little Help needed moving from lying on your back to sitting on the side of a flat bed without using bedrails?: A Little Help needed moving to and from a bed to a chair (including a wheelchair)?: A Lot Help needed standing up from a chair using your arms (e.g., wheelchair or bedside chair)?: A Lot Help needed to walk in hospital room?: A Lot Help needed climbing 3-5 steps with a railing? : Total 6 Click Score: 13    End of Session Equipment Utilized During Treatment: Gait belt Activity Tolerance: Patient tolerated treatment well Patient left: in chair;with chair alarm set;with call bell/phone within reach Nurse Communication: Mobility status;Other (comment) PT Visit Diagnosis: Other abnormalities of gait and  mobility (R26.89);Muscle weakness (generalized) (M62.81)     Time: 0240-9735 PT Time Calculation (min) (ACUTE ONLY): 24 min  Charges:  $Therapeutic Exercise: 8-22 mins $Therapeutic Activity: 8-22 mins                     Latorria Zeoli P., PTA Acute Rehabilitation Services Pager: [phone] Office: [place] 08/19/2021, 5:47 PM

## 2021-08-20 NOTE — Progress Notes (Signed)
Patient ID: Keith [name], male   DOB: 1951-04-26, 70 y.o.   MRN: [id number] BP 108/76 (BP Location: Right Arm)   Pulse 82   Temp 98.1 F (36.7 C) (Oral)   Resp 18   Ht 6' (1.829 m)   Wt 128.3 kg   SpO2 100%   BMI 38.36 kg/m  Alert and oriented Wound is clean and dry Moving all extremities well Improving slowly Awaiting bed on CIR

## 2021-08-20 NOTE — Plan of Care (Signed)

## 2021-08-21 ENCOUNTER — Encounter (HOSPITAL_COMMUNITY): Payer: Self-pay | Admitting: Physical Medicine and Rehabilitation

## 2021-08-21 ENCOUNTER — Other Ambulatory Visit: Payer: Self-pay | Admitting: Family Medicine

## 2021-08-21 ENCOUNTER — Other Ambulatory Visit: Payer: Self-pay

## 2021-08-21 ENCOUNTER — Inpatient Hospital Stay (HOSPITAL_COMMUNITY)
Admission: RE | Admit: 2021-08-21 | Discharge: 2021-09-06 | DRG: 092 | Disposition: A | Discharge status: Home Health | Payer: Medicare Other | Source: Intra-hospital | Attending: Physical Medicine and Rehabilitation | Admitting: Physical Medicine and Rehabilitation

## 2021-08-21 DIAGNOSIS — M62838 Other muscle spasm: Secondary | ICD-10-CM | POA: Diagnosis not present

## 2021-08-21 DIAGNOSIS — G9520 Unspecified cord compression: Secondary | ICD-10-CM | POA: Diagnosis not present

## 2021-08-21 DIAGNOSIS — Z886 Allergy status to analgesic agent status: Secondary | ICD-10-CM

## 2021-08-21 DIAGNOSIS — Z87891 Personal history of nicotine dependence: Secondary | ICD-10-CM | POA: Diagnosis not present

## 2021-08-21 DIAGNOSIS — Z981 Arthrodesis status: Secondary | ICD-10-CM | POA: Diagnosis not present

## 2021-08-21 DIAGNOSIS — Z888 Allergy status to other drugs, medicaments and biological substances status: Secondary | ICD-10-CM | POA: Diagnosis not present

## 2021-08-21 DIAGNOSIS — M4714 Other spondylosis with myelopathy, thoracic region: Secondary | ICD-10-CM | POA: Diagnosis present

## 2021-08-21 DIAGNOSIS — I10 Essential (primary) hypertension: Secondary | ICD-10-CM | POA: Diagnosis not present

## 2021-08-21 DIAGNOSIS — G9589 Other specified diseases of spinal cord: Secondary | ICD-10-CM | POA: Diagnosis not present

## 2021-08-21 DIAGNOSIS — G8929 Other chronic pain: Secondary | ICD-10-CM | POA: Diagnosis present

## 2021-08-21 DIAGNOSIS — E1142 Type 2 diabetes mellitus with diabetic polyneuropathy: Secondary | ICD-10-CM | POA: Diagnosis not present

## 2021-08-21 DIAGNOSIS — N179 Acute kidney failure, unspecified: Secondary | ICD-10-CM | POA: Diagnosis not present

## 2021-08-21 DIAGNOSIS — E1165 Type 2 diabetes mellitus with hyperglycemia: Secondary | ICD-10-CM | POA: Diagnosis not present

## 2021-08-21 DIAGNOSIS — Z79891 Long term (current) use of opiate analgesic: Secondary | ICD-10-CM | POA: Diagnosis not present

## 2021-08-21 DIAGNOSIS — D62 Acute posthemorrhagic anemia: Secondary | ICD-10-CM | POA: Diagnosis not present

## 2021-08-21 DIAGNOSIS — Z79899 Other long term (current) drug therapy: Secondary | ICD-10-CM | POA: Diagnosis not present

## 2021-08-21 DIAGNOSIS — E785 Hyperlipidemia, unspecified: Secondary | ICD-10-CM | POA: Diagnosis present

## 2021-08-21 DIAGNOSIS — N183 Chronic kidney disease, stage 3 unspecified: Secondary | ICD-10-CM | POA: Diagnosis present

## 2021-08-21 LAB — CREATININE, SERUM
Creatinine, Ser: 1.6 mg/dL — ABNORMAL HIGH (ref 0.61–1.24)
GFR, Estimated: 46 mL/min — ABNORMAL LOW (ref >60)

## 2021-08-21 MED ORDER — GUAIFENESIN-DM 100-10 MG/5ML PO SYRP: 5.0000 mL | ORAL_SOLUTION | Freq: Four times a day (QID) | ORAL | Status: DC | PRN

## 2021-08-21 MED ORDER — SIMVASTATIN 20 MG PO TABS
40.0000 mg | ORAL_TABLET | Freq: Every day | ORAL | Status: DC
  Administered 2021-08-21 – 2021-09-05 (×16): 40 mg via ORAL
  Filled 2021-08-21 (×17): qty 2

## 2021-08-21 MED ORDER — POLYETHYLENE GLYCOL 3350 17 G PO PACK: 17.0000 g | PACK | Freq: Every day | ORAL | Status: DC | PRN

## 2021-08-21 MED ORDER — METFORMIN HCL 500 MG PO TABS
250.0000 mg | ORAL_TABLET | Freq: Every day | ORAL | Status: DC
  Administered 2021-08-22 – 2021-08-24 (×3): 250 mg via ORAL
  Filled 2021-08-21 (×3): qty 1

## 2021-08-21 MED ORDER — BISACODYL 10 MG RE SUPP: 10.0000 mg | Freq: Every day | RECTAL | Status: DC | PRN

## 2021-08-21 MED ORDER — ADULT MULTIVITAMIN W/MINERALS CH
1.0000 | ORAL_TABLET | Freq: Every day | ORAL | Status: DC
  Administered 2021-08-22 – 2021-09-06 (×16): 1 via ORAL
  Filled 2021-08-21 (×16): qty 1

## 2021-08-21 MED ORDER — TRAZODONE HCL 50 MG PO TABS
25.0000 mg | ORAL_TABLET | Freq: Every evening | ORAL | Status: DC | PRN
  Administered 2021-09-04: 20:00:00 50 mg via ORAL

## 2021-08-21 MED ORDER — CLONIDINE HCL 0.1 MG PO TABS
0.1000 mg | ORAL_TABLET | Freq: Every day | ORAL | Status: DC
  Administered 2021-08-22 – 2021-09-06 (×16): 0.1 mg via ORAL
  Filled 2021-08-21 (×16): qty 1

## 2021-08-21 MED ORDER — POLYETHYLENE GLYCOL 3350 17 G PO PACK
17.0000 g | PACK | Freq: Every day | ORAL | Status: DC | PRN
  Filled 2021-08-21 (×2): qty 1

## 2021-08-21 MED ORDER — CHLORTHALIDONE 25 MG PO TABS
25.0000 mg | ORAL_TABLET | Freq: Every day | ORAL | Status: DC
  Administered 2021-08-22 – 2021-09-06 (×16): 25 mg via ORAL
  Filled 2021-08-21 (×16): qty 1

## 2021-08-21 MED ORDER — DULOXETINE HCL 60 MG PO CPEP
60.0000 mg | ORAL_CAPSULE | Freq: Every day | ORAL | Status: DC
  Administered 2021-08-22 – 2021-09-06 (×16): 60 mg via ORAL
  Filled 2021-08-21 (×16): qty 1

## 2021-08-21 MED ORDER — PROCHLORPERAZINE MALEATE 5 MG PO TABS: 5.0000 mg | ORAL_TABLET | Freq: Four times a day (QID) | ORAL | Status: DC | PRN

## 2021-08-21 MED ORDER — ATENOLOL 25 MG PO TABS
50.0000 mg | ORAL_TABLET | Freq: Every day | ORAL | Status: DC
  Administered 2021-08-22 – 2021-09-06 (×16): 50 mg via ORAL
  Filled 2021-08-21 (×16): qty 2

## 2021-08-21 MED ORDER — PROCHLORPERAZINE EDISYLATE 10 MG/2ML IJ SOLN: 5.0000 mg | Freq: Four times a day (QID) | INTRAMUSCULAR | Status: DC | PRN

## 2021-08-21 MED ORDER — FLEET ENEMA 7-19 GM/118ML RE ENEM: 1.0000 | ENEMA | Freq: Once | RECTAL | Status: DC | PRN

## 2021-08-21 MED ORDER — PROCHLORPERAZINE 25 MG RE SUPP: 12.5000 mg | Freq: Four times a day (QID) | RECTAL | Status: DC | PRN

## 2021-08-21 MED ORDER — POTASSIUM CHLORIDE CRYS ER 20 MEQ PO TBCR
20.0000 meq | EXTENDED_RELEASE_TABLET | Freq: Every day | ORAL | Status: DC
  Administered 2021-08-22 – 2021-09-06 (×16): 20 meq via ORAL
  Filled 2021-08-21 (×16): qty 1

## 2021-08-21 MED ORDER — ENOXAPARIN SODIUM 40 MG/0.4ML IJ SOSY
40.0000 mg | PREFILLED_SYRINGE | INTRAMUSCULAR | Status: DC
  Administered 2021-08-21 – 2021-09-05 (×16): 40 mg via SUBCUTANEOUS
  Filled 2021-08-21 (×16): qty 0.4

## 2021-08-21 MED ORDER — DIPHENHYDRAMINE HCL 12.5 MG/5ML PO ELIX
12.5000 mg | ORAL_SOLUTION | Freq: Four times a day (QID) | ORAL | Status: DC | PRN
Start: 2021-08-21 — End: 2021-09-06

## 2021-08-21 MED ORDER — OXYCODONE-ACETAMINOPHEN 5-325 MG PO TABS
1.0000 | ORAL_TABLET | ORAL | Status: DC | PRN
  Administered 2021-08-21 – 2021-09-06 (×53): 2 via ORAL
  Filled 2021-08-21 (×37): qty 2
  Filled 2021-08-21: qty 1
  Filled 2021-08-21 (×6): qty 2
  Filled 2021-08-21: qty 1
  Filled 2021-08-21 (×10): qty 2

## 2021-08-21 MED ORDER — ACETAMINOPHEN 325 MG PO TABS
325.0000 mg | ORAL_TABLET | ORAL | Status: DC | PRN
  Filled 2021-08-21: qty 2

## 2021-08-21 MED ORDER — GABAPENTIN 300 MG PO CAPS
600.0000 mg | ORAL_CAPSULE | Freq: Three times a day (TID) | ORAL | Status: DC
  Administered 2021-08-21 – 2021-09-06 (×47): 600 mg via ORAL
  Filled 2021-08-21 (×47): qty 2

## 2021-08-21 MED ORDER — ALUM & MAG HYDROXIDE-SIMETH 200-200-20 MG/5ML PO SUSP: 30.0000 mL | ORAL | Status: DC | PRN

## 2021-08-21 NOTE — H&P (Signed)
Physical Medicine and Rehabilitation Admission H&P        Chief Complaint  Patient presents with   Extremity Weakness      HPI: Keith [name]. Meints is a 70 year old male with history of HTN, T2DM, dyslipidemia who was admitted on 08/07/21 with 6 month history of progressive back pain with numbness BLE who reported having had  ESI a few weeks PTA with resolution of back pain but then developed progressive weakness. MRI brain done revealing chronic thoracic spine ankylosis T7-T11 with severe degeneration of T11-T12 moderate to severe spinal stenosis and subsequent cord compression T11-T12 with progression of focal myelomalacia.  He was taken to the OR for T11-T12 laminectomy with right T11-T12 facet and posterior spinal fusion by Dr. Venetia Constable on 08/09/21.  Postop reporting improvement in BLE strength but continues to be limited by balance deficits with weakness, sensory deficits as well as decreased endurance.  CIR recommended due to functional decline.     Review of Systems  Constitutional:  Negative for chills and fever.  HENT:  Negative for hearing loss and tinnitus.   Respiratory:  Negative for shortness of breath.   Cardiovascular:  Negative for chest pain and palpitations.  Gastrointestinal:  Negative for constipation and heartburn.  Genitourinary:  Negative for dysuria.  Musculoskeletal:  Positive for myalgias (right ankle pain with pressure changes/fractured 2021.). Negative for joint pain.  Neurological:  Positive for sensory change and focal weakness. Negative for dizziness and headaches.  Psychiatric/Behavioral:  Negative for memory loss. The patient does not have insomnia.            Past Surgical History:  Procedure Laterality Date   COLONOSCOPY   2008   ESOPHAGOGASTRODUODENOSCOPY N/A 08/29/2014    Procedure: ESOPHAGOGASTRODUODENOSCOPY (EGD);  [name]: Keith Dragon, MD;  Location: [facility];  Service: Endoscopy;  Laterality: N/A;   LAMINECTOMY N/A 07/28/2018     Procedure: THORACIC ELEVEN- THORACIC TWELVE POSTERIOR DECOMPRESSION LAMINECTOMY;  [name]: Keith Part, MD;  Location: [facility];  Service: Neurosurgery;  Laterality: N/A;   LUMBAR LAMINECTOMY       NASAL POLYP SURGERY       ORIF ANKLE FRACTURE Right 07/01/2020    Procedure: OPEN REDUCTION INTERNAL FIXATION (ORIF) ANKLE FRACTURE. TRIMALLEOLAR;  [name]: Keith Bond, MD;  Location: [facility];  Service: Orthopedics;  Laterality: Right;   TONSILLECTOMY               Family History  Problem Relation Age of Onset   Diabetes Mother     Asthma Mother     Hypertension Father     Stroke Father     Colon cancer Neg Hx        Social History: Married. 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 He  reports that he quit smoking about 1995. He has a 25.00 pack-year smoking history. He has never used smokeless tobacco. He reports current alcohol use. He reports that he does not use drugs.          Allergies  Allergen Reactions   Aspirin Other (See Comments)      Irritates the stomach and the patient developed ulcers, also   Lisinopril Other (See Comments)      Caused a body ache            Medications Prior to Admission  Medication Sig Dispense Refill   aluminum-magnesium hydroxide-simethicone (MAALOX) [phone] MG/5ML SUSP Take 15-30 mLs by mouth 3 (three) times daily as needed (for heartburn  or indigestion).       atenolol-chlorthalidone (TENORETIC) 50-25 MG tablet Take 1 tablet by mouth daily. 90 tablet 2   cloNIDine (CATAPRES) 0.1 MG tablet TAKE 1 TABLET (0.1 MG TOTAL) BY MOUTH DAILY. 90 tablet 2   DULoxetine (CYMBALTA) 30 MG capsule Take 2 capsules (60 mg total) by mouth daily. 30 capsule 3   gabapentin (NEURONTIN) 600 MG tablet Take 600 mg by mouth 3 (three) times daily.       Multiple Vitamin (MULITIVITAMIN WITH MINERALS) TABS Take 1 tablet by mouth daily with breakfast.       oxyCODONE-acetaminophen (PERCOCET)  7.5-325 MG tablet Take 2 tablets by mouth in the morning and at bedtime.       polyethylene glycol (MIRALAX / GLYCOLAX) 17 g packet Take 17 g by mouth 2 (two) times daily. (Patient taking differently: Take 17 g by mouth daily as needed for mild constipation.) 14 each 0   potassium chloride SA (KLOR-CON) 20 MEQ tablet TAKE 1 TABLET (20 MEQ TOTAL) BY MOUTH DAILY. (Patient taking differently: Take 20 mEq by mouth daily.) 30 tablet 0   simvastatin (ZOCOR) 40 MG tablet TAKE 1 TABLET BY MOUTH EVERY DAY (Patient taking differently: Take 40 mg by mouth at bedtime.) 90 tablet 2   DULoxetine (CYMBALTA) 60 MG capsule TAKE 1 CAPSULE (60 MG TOTAL) BY MOUTH DAILY. (Patient not taking: Reported on 08/08/2021) 30 capsule 3      Drug Regimen Review  Drug regimen was reviewed and remains appropriate with no significant issues identified   Home: Home Living Family/patient expects to be discharged to:: Private residence Living Arrangements: Spouse/significant other Available Help at Discharge: Family, Available 24 hours/day Type of Home: House Home Access: Stairs to enter [employer] of Steps: 2 Entrance Stairs-Rails: Right Home Layout: Two level, 1/2 bath on main level Alternate Level Stairs-Number of Steps: 11 Alternate Level Stairs-Rails: Right Bathroom Shower/Tub: Gaffer, Chiropodist: Handicapped height Bathroom Accessibility: Yes Home Equipment: Conservation officer, nature (2 wheels), Civil engineer, contracting, BSC  Lives With: Spouse   Functional History: Prior Function Prior Level of Function : Independent/Modified Independent Mobility Comments: Using RW due to BLE weakness ADLs Comments: Performing ADLs; wife assiting with LB ADLs as needed   Functional Status:  Mobility: Bed Mobility Overal bed mobility: Needs Assistance Bed Mobility: Supine to Sit Rolling: Min guard Sidelying to sit: Min assist Supine to sit: HOB elevated, Min assist Sit to sidelying: Mod assist, HOB  elevated General bed mobility comments: cues for proper body mechanics at times, minA for BLE to advance safely to EOB but pt with good initiation/use of railing for trunk raise Transfers Overall transfer level: Needs assistance Equipment used: Rolling walker (2 wheels) Transfers: Sit to/from Stand, Bed to chair/wheelchair/BSC Sit to Stand: Mod assist, From elevated surface Bed to/from chair/wheelchair/BSC transfer type:: Step pivot Step pivot transfers: Min assist General transfer comment: able to stand with +1 assist from elevated bed height, second person present but not needing to assist to lift; minA for stepping around to chair ~84ft with RW Ambulation/Gait Ambulation/Gait assistance: +2 safety/equipment, Min assist (Chair follow) Gait Distance (Feet): 40 Feet (40 ft, seated break, 40 ft) Assistive device: Rolling walker (2 wheels) Gait Pattern/deviations: Step-through pattern, Decreased stride length, Trunk flexed General Gait Details: defer hallway gait progression due to lack of +2 assist at time of session and pt requesting to eat dinner, wants to walk to bathroom later in day after his meal. Gait velocity: decreased   ADL: ADL Overall ADL's :  Needs assistance/impaired Eating/Feeding: Set up, Sitting Grooming: Oral care, Minimal assistance, Standing, Wash/dry face, Min guard, Sitting Grooming Details (indicate cue type and reason): Pt performing oral care while standing at sink with Min A for balance. Pt requiring significant effort and maintaining UE support due to decreased balance. Sitting at [facility] for rest break. Performing face hygiene while seated Upper Body Bathing: Set up, Sitting Upper Body Bathing Details (indicate cue type and reason): bathed while sitting at the sink Lower Body Bathing: Maximal assistance, Sit to/from stand Lower Body Bathing Details (indicate cue type and reason): pt able to stand at the sink while therapist and his wife assisted with peri area bathing.  Pt can bathe thighs to knees while sitting Upper Body Dressing : Set up, Sitting Lower Body Dressing: Moderate assistance, Sit to/from stand Lower Body Dressing Details (indicate cue type and reason): Pt able to bring ankles to knees for donning of shoes. Requiring assistance to tie and wrap L ankle brace after pt donned over foot. Assistance for managing back of R shoe Toilet Transfer: Minimal assistance, Ambulation, BSC/3in1, Rolling walker (2 wheels) Toilet Transfer Details (indicate cue type and reason): Min A for power up and gaining balance. Very slow and requiring significant effort. But very motivated despite weakness and pain Toileting - Clothing Manipulation Details (indicate cue type and reason): Pt managing urinal at EOB with increased time Functional mobility during ADLs: Minimal assistance, Rolling walker (2 wheels) General ADL Comments: Very slow and requiring significant effort. But very motivated despite weakness and pain   Cognition: Cognition Overall Cognitive Status: Within Functional Limits for tasks assessed Orientation Level: Oriented X4 Cognition Arousal/Alertness: Awake/alert Behavior During Therapy: WFL for tasks assessed/performed Overall Cognitive Status: Within Functional Limits for tasks assessed General Comments: Very motivated, good participation   Physical Exam: Blood pressure 103/70, pulse 87, temperature 97.7 F (36.5 C), temperature source Oral, resp. rate 16, height 6' (1.829 m), weight 128.3 kg, SpO2 97 %. Physical Exam Vitals and nursing note reviewed.  Constitutional:      Appearance: Normal appearance.  Abdominal:     General: There is distension.     Tenderness: There is no abdominal tenderness.     Comments: Decreased BS.    Skin:    Comments: Back incision C/D/I  Neurological:     Mental Status: He is alert and oriented to person, place, and time.   General: No acute distress Mood and affect are appropriate Heart: Regular rate and  rhythm no rubs murmurs or extra sounds Lungs: Clear to auscultation, breathing unlabored, no rales or wheezes Abdomen: Positive bowel sounds, soft nontender to palpation, nondistended Extremities: No clubbing, cyanosis, or edema Skin: No evidence of breakdown, no evidence of rash Neurologic: Cranial nerves II through XII intact, motor strength is 5/5 in bilateral deltoid, bicep, tricep, grip, 2-hip flexor,3- knee extensors, 2- ankle dorsiflexor and plantar flexor Sensory exam normal sensation to light touch and reduced Right toe proprioception in bilateral upper and lower extremities  Musculoskeletal: Full range of motion in all 4 extremities. No joint swelling    Lab Results Last 48 Hours  No results found for this or any previous visit (from the past 48 hour(s)).   Imaging Results (Last 48 hours)  No results found.           Medical Problem List and Plan: 1.  Decline in self care and mobility  secondary to THoracic myelopathy             -patient may not  shower             -ELOS/Goals: 14-16d Sup/Min A 2.  Antithrombotics: -DVT/anticoagulation:  Pharmaceutical: Lovenox             -antiplatelet therapy: N/A 3. Chronic Pain Management:  Sees Dr. Glenford Bayley on oxycodone 5 mg qid, gabapentin and Cymbalta.   --Continue Oxycodone prn  4. Mood: LCSW to follow for evaluation and support.              -antipsychotic agents: N/A 5. Neuropsych: This patient is capable of making decisions on his own behalf. 6. Skin/Wound Care: Monitor wound for healing.  7. Fluids/Electrolytes/Nutrition: Monitor I/O.  8. T2DM: Hgb A1c-6.2 and BS controlled on low dose metformin --Monitor BS ac/hs and use SSI as needed.  -- Continue metformin daily  9. HTN; BP TID--on atenolol/hygroton and low dose catapres.  11. Neuropathy: continue Gabapentin TID. 12. Dyslipidemia: ON Zocor 13. Post op labs: Will recheck labs in am.       Bary Leriche, PA-C 08/21/2021  "I have personally performed a face to  face diagnostic evaluation of this patient.  Additionally, I have reviewed and concur with the physician assistant's documentation above." Charlett Blake M.D. [place] [facility] Fellow Am Acad of Phys Med and Rehab Diplomate Am Board of Electrodiagnostic Med Fellow Am Board of Interventional Pain

## 2021-08-21 NOTE — Progress Notes (Signed)
Physical Therapy Treatment Patient Details Name: Keith [name] MRN: [id number] DOB: April 18, 1951 Today's Date: 08/21/2021   History of Present Illness 70 yo male presenting to ED on 11/1 with bilateral LE weakness. S/p T11-12 laminectomy, right T11-12 facetectomy, T11-T12 posterior spinal instrumented fusion with pedicle screw instrumentation and posterolateral fusion on 11/2. PMH including DM, HTN, obesity, PE, and scoliosis.    PT Comments    Patient progressing with distance this session.  Fatigued and SOB with HR up to 120's after ambulation.  Patient admits to breath holding when pushing himself near then end.  Encouraged breathing.  Patient remains appropriate for acute inpatient rehab prior to d/c home.  Continues to demonstrate motivation and pushing himself.  Did report less pain with ambulation this session as well.    Recommendations for follow up therapy are one component of a multi-disciplinary discharge planning process, led by the attending physician.  Recommendations may be updated based on patient status, additional functional criteria and insurance authorization.  Follow Up Recommendations  Acute inpatient rehab (3hours/day)     Assistance Recommended at Discharge Frequent or constant Supervision/Assistance  Equipment Recommendations  None recommended by PT    Recommendations for Other Services       Precautions / Restrictions Precautions Precautions: Back Required Braces or Orthoses: Other Brace (L ankle lace up brace) Spinal Brace Comments: No brace per MD note     Mobility  Bed Mobility Overal bed mobility: Needs Assistance Bed Mobility: Supine to Sit     Supine to sit: HOB elevated;Min assist     General bed mobility comments: using rail and momentum to sit up with increased time to move legs off EOB; assist for trunk    Transfers Overall transfer level: Needs assistance Equipment used: Rolling walker (2 wheels) Transfers: Sit to/from Stand Sit  to Stand: Mod assist;From elevated surface           General transfer comment: increased time to stand, using momentum and assist for lifting    Ambulation/Gait Ambulation/Gait assistance: Min assist Gait Distance (Feet): 65 Feet (x 2) Assistive device: Rolling walker (2 wheels) Gait Pattern/deviations: Step-through pattern;Decreased stride length;Shuffle;Wide base of support;Knees buckling       General Gait Details: kept chair close and pt determined to get to fire door in hallway then sat in recliner to rest, then walked back to room.  PT rather dyspneic noted HR 124, SpO2 99% on RA.  HR down to 100's in 2 minutes rest   Stairs             Wheelchair Mobility    Modified Rankin (Stroke Patients Only)       Balance Overall balance assessment: Needs assistance   Sitting balance-Leahy Scale: Good Sitting balance - Comments: crossed leg technique to don shoe on L   Standing balance support: Reliant on assistive device for balance Standing balance-Leahy Scale: Poor Standing balance comment: Reliant on BUE for balance                            Cognition Arousal/Alertness: Awake/alert Behavior During Therapy: WFL for tasks assessed/performed Overall Cognitive Status: Within Functional Limits for tasks assessed                                          Exercises      General Comments  Pertinent Vitals/Pain Pain Score: 5  Pain Location: Back, particularly lower L side Pain Descriptors / Indicators: Aching Pain Intervention(s): Monitored during session;Premedicated before session    Home Living                          Prior Function            PT Goals (current goals can now be found in the care plan section) Progress towards PT goals: Progressing toward goals    Frequency    Min 5X/week      PT Plan Current plan remains appropriate    Co-evaluation              AM-PAC PT "6 Clicks"  Mobility   Outcome Measure  Help needed turning from your back to your side while in a flat bed without using bedrails?: A Little Help needed moving from lying on your back to sitting on the side of a flat bed without using bedrails?: A Little Help needed moving to and from a bed to a chair (including a wheelchair)?: A Lot Help needed standing up from a chair using your arms (e.g., wheelchair or bedside chair)?: A Lot Help needed to walk in hospital room?: A Lot Help needed climbing 3-5 steps with a railing? : Total 6 Click Score: 13    End of Session Equipment Utilized During Treatment: Gait belt Activity Tolerance: Patient tolerated treatment well Patient left: in chair;with call bell/phone within reach;with chair alarm set   PT Visit Diagnosis: Other abnormalities of gait and mobility (R26.89);Muscle weakness (generalized) (M62.81)     Time: 7902-4097 PT Time Calculation (min) (ACUTE ONLY): 26 min  Charges:  $Gait Training: 23-37 mins                     Magda Kiel, PT Acute Rehabilitation Services Pager:[phone] Office:[phone] 08/21/2021    [name] 08/21/2021, 1:00 PM

## 2021-08-21 NOTE — Discharge Summary (Signed)
Discharge Summary  Date of Admission: 08/07/2021  Date of Discharge: 08/21/21  Attending Physician: Emelda Brothers, MD  Hospital Course: Patient was sent to the ED by his pain physician after presenting with severe progressive bilateral lower extremity weakness. An MRI showed degenerative changes in the lumber spine, in the thoracic spine there was recurrent and worsening stenosis with cord signal change. He therefore went to the OR on 08/09/21 for a T11-12 decompression and instrumented fusion. Post-operatively, his reflexes returned to normal and he began to have slow steady improvement in his severe bilateral lower extremity weakness. His hospital course was uncomplicated and the patient was discharged to CIR on 08/21/21. He will follow up in clinic with me in 2 weeks.  Neurologic exam at discharge:  Strength 5/5 in BUE, BLE are diffusely 3/5 bilaterally proximally and 4-/5 distally, normalized reflexes and no ankle clonus, mild T12 sensory level  Discharge diagnosis: Thoracic myelopathy  Judith Part, MD 08/21/21 10:25 AM

## 2021-08-21 NOTE — Progress Notes (Signed)
Neurosurgery Service Progress Note  Subjective: No acute events overnight, no new comlpaints  Objective: Vitals:   08/20/21 1925 08/20/21 2351 08/21/21 0350 08/21/21 0803  BP: 101/74 95/69 101/65 103/70  Pulse: 81 79 81 87  Resp: 18 16 15 16   Temp: 98.3 F (36.8 C) 97.8 F (36.6 C) 97.9 F (36.6 C) 97.7 F (36.5 C)  TempSrc: Oral Axillary Oral Oral  SpO2: 97% 97% 97% 97%  Weight:      Height:        Physical Exam: Strength 5/5 in BUE, BLE are diffusely 3/5 bilaterally proximally and 4-/5 distally, normalized reflexes and no ankle clonus, mild T12 sensory level Incision c/d/i  Assessment & Plan: 70 y.o. man w/ recurrent thoracic myelopathy in the setting of degenerative scoliosis, MRI with recurrent stenosis at T11-12 with worsening cord signal change. 11/2 s/p T11-12 repeat decompression and PSIF. 11/3 XR T-spine hardware in place, difficult to evaluate in detail 2/2 habitus  -activity / diet as tolerated, no brace / restrictions needed -PT/OT rec'd CIR, appears that he was denied then approved on appeal, CIR transfer pending -SCDs, TEDs, SQH  Judith Part  08/21/21 8:21 AM

## 2021-08-21 NOTE — Progress Notes (Signed)
Report received from [place] on 3W. Last Bm was reported to be 08/19/21  Patient arrived to the unit via wheelchair accompanied by x2 staff members.  Alert and oriented x4, oriented to unit and assigned room.  No complaints of pain, no signs or symptoms of distress noted.  All assessments complete. Fluids at bedside. Bed at lowest position, call bell within reach. Wife at bedside.

## 2021-08-21 NOTE — TOC Transition Note (Signed)
Transition of Care [facility]) - CM/SW Discharge Note   Patient Details  Name: SHOJI PERTUIT MRN: [id number] Date of Birth: 02/29/1951  [facility] CM/SW Contact:  Pollie Friar, RN Phone Number: 08/21/2021, 12:01 PM   Clinical Narrative:    Patient is discharging to CIR today. CM signing off.    Final next level of care: IP Rehab Facility Barriers to Discharge: No Barriers Identified   Patient Goals and CMS Choice        Discharge Placement                       Discharge Plan and Services   Discharge Planning Services: CM Consult                                 Social Determinants of Health (SDOH) Interventions     Readmission Risk Interventions No flowsheet data found.

## 2021-08-21 NOTE — Progress Notes (Signed)
Inpatient Rehab Admissions Coordinator:    I have insurance approval and a bed available for pt to admit to CIR today. Dr. Zada Finders in agreement.  Will let pt/family and TOC team know.   Shann Medal, PT, DPT Admissions Coordinator [phone] 08/21/21  10:18 AM

## 2021-08-21 NOTE — Progress Notes (Signed)
PMR Admission Coordinator Pre-Admission Assessment   Patient: Keith [name] is an 70 y.o., male MRN: [id number] DOB: 03-Jun-1951 Height: 6' (182.9 cm) Weight: 128.3 kg   Insurance Information HMO: yes    PPO:      PCP:      IPA:      80/20:      OTHER:  PRIMARY: UHC Medicare      Policy#: [id number]      Subscriber: patient CM Name: Roselyn Reef      Phone#: [phone]     Fax#: [phone] Pre-Cert#: [id number] approval via expedited appeal, updates due to fax listed above on 11/18    Employer:  Benefits:  Phone #: [email or web address]     Name:  Eff. Date: 10/08/20-10/07/21     Deduct: $0 (does not have deductible)      Out of Pocket Max: $3,600 ($1,931.20 met)      Life Max: NA CIR: $295/day co-pay for days 1-5, 100% coverage days 6+      SNF: 100% coverage days 1-20, $188 co-pay/day days 21-40, 100% coverage days 41-100 Outpatient: $30/visit co-pay     Co-Pay:  Home Health: 100% coverage      Co-Pay:  DME: 80%      Co-Pay: 20% co-insurance Providers: in-network SECONDARY:       Policy#:      Phone#:    Development worker, community:       Phone#:    The Engineer, petroleum" for patients in Inpatient Rehabilitation Facilities with attached "Privacy Act [place] Records" was provided and verbally reviewed with: Patient   Emergency Contact Information Contact Information       Name Relation Home Work Mobile    Mika,Christine Spouse [phone]   [phone]           Current Medical History  Patient Admitting Diagnosis: s/p T11-12 repeat decompression and T11-12 PSIF History of Present Illness: Pt is a 70 year old male with medical hx significant for: DM, HTN, obesity, PE, scoliosis, h/o thoracic myelopathy and decompression in 2019. Pt presented to hospital on 10/31 d/t bilateral LE weakness x2 weeks. Pt had two nerve blocks two weeks prior to presentation. This had excellent relief for about 7-10 days.  Pt denies B/B changes, some numbness in BLEs  starting at the inguinal ligament and distal. MRI L spine showed grade 1 L4-5 spondylolithesis, moderate R L1-2 and L2-3 foraminal stenosis, L3-4 severe canal/bilateral foraminal stenosis, R>L L4-5 foraminal stenosis, lateral recess stenosis with some left sided 5-1 foraminal stenosis.  MRI t spine showed recurrent stenosis at T11-12 with worsening cord signal change.  Pt underwent repeat decompression and PSIF at T11-12 on 11/2 per Dr. Zada Finders.  Post op course pain management.  Pt was evaluated by therapy and recommendations were for CIR.    Patient's medical record from [facility] has been reviewed by the rehabilitation admission coordinator and physician.   Past Medical History      Past Medical History:  Diagnosis Date   Allergy     Chronic back pain     Diabetes mellitus without complication ([place])     DNR (do not resuscitate) 11/19/2020   Duodenal ulcer hemorrhage 08/29/2014   ED (erectile dysfunction)     Esophageal stricture 08/30/2014   Glucose intolerance (impaired glucose tolerance)     Hiatal hernia 08/30/2014   Hypertension     Hypokalemia 04/11/2013   MRSA carrier 08/30/2014   Obesity     Osteoarthritis     Pneumonia  Pulmonary embolism ([place]) 06/2020   Scoliosis 2016   Spinal stenosis of lumbar region     Urinary tract infection 04/11/2013      Has the patient had major surgery during 100 days prior to admission? Yes   Family History   family history includes Asthma in his mother; Diabetes in his mother; Hypertension in his father; Stroke in his father.   Current Medications   Current Facility-Administered Medications:    alum & mag hydroxide-simeth (MAALOX/MYLANTA) 200-200-20 MG/5ML suspension 15-30 mL, 15-30 mL, Oral, TID PRN, Judith Part, MD   atenolol (TENORMIN) tablet 50 mg, 50 mg, Oral, Daily, 50 mg at 08/21/21 0905 **AND** chlorthalidone (HYGROTON) tablet 25 mg, 25 mg, Oral, Daily, Ostergard, Thomas A, MD, 25 mg at 08/21/21 1224    cloNIDine (CATAPRES) tablet 0.1 mg, 0.1 mg, Oral, Daily, Ostergard, Thomas A, MD, 0.1 mg at 08/20/21 1049   DULoxetine (CYMBALTA) DR capsule 60 mg, 60 mg, Oral, Daily, Ostergard, Joyice Faster, MD, 60 mg at 08/21/21 0905   gabapentin (NEURONTIN) capsule 600 mg, 600 mg, Oral, TID, Judith Part, MD, 600 mg at 08/21/21 0905   heparin injection 5,000 Units, 5,000 Units, Subcutaneous, Q8H, Ostergard, Joyice Faster, MD, 5,000 Units at 08/21/21 8250   HYDROmorphone (DILAUDID) injection 1 mg, 1 mg, Intravenous, Q3H PRN, Judith Part, MD, 1 mg at 08/20/21 1718   metFORMIN (GLUCOPHAGE) tablet 250 mg, 250 mg, Oral, Q breakfast, Ostergard, Joyice Faster, MD, 250 mg at 08/21/21 0370   multivitamin with minerals tablet 1 tablet, 1 tablet, Oral, Q breakfast, Judith Part, MD, 1 tablet at 08/21/21 4888   oxyCODONE-acetaminophen (PERCOCET/ROXICET) 5-325 MG per tablet 1-2 tablet, 1-2 tablet, Oral, Q4H PRN, Judith Part, MD, 2 tablet at 08/21/21 0914   polyethylene glycol (MIRALAX / GLYCOLAX) packet 17 g, 17 g, Oral, Daily PRN, Judith Part, MD   potassium chloride SA (KLOR-CON) CR tablet 20 mEq, 20 mEq, Oral, Daily, Ostergard, Thomas A, MD, 20 mEq at 08/21/21 0905   simvastatin (ZOCOR) tablet 40 mg, 40 mg, Oral, QHS, Judith Part, MD, 40 mg at 08/20/21 2201   Patients Current Diet:  Diet Order                  Diet regular Room service appropriate? Yes; Fluid consistency: Thin  Diet effective now                         Precautions / Restrictions Precautions Precautions: Back Precaution Booklet Issued: No Precaution Comments: 3/3 recalled Spinal Brace: Other (comment) Spinal Brace Comments: No brace per MD note Other Brace: Utilized lace up ankle brace today that pt brought from home Restrictions Weight Bearing Restrictions: No    Has the patient had 2 or more falls or a fall with injury in the past year? Yes   Prior Activity Level Limited Community (1-2x/wk): gets out  of house 2-3 days/week; drives   Prior Functional Level Self Care: Did the patient need help bathing, dressing, using the toilet or eating? Independent   Indoor Mobility: Did the patient need assistance with walking from room to room (with or without device)? Independent   Stairs: Did the patient need assistance with internal or external stairs (with or without device)? Independent   Functional Cognition: Did the patient need help planning regular tasks such as shopping or remembering to take medications? Independent   Patient Information Are you of Hispanic, Latino/a,or Spanish origin?: A. No, not of  Hispanic, Latino/a, or Spanish origin What is your race?: B. Black or African American Do you need or want an interpreter to communicate with a doctor or health care staff?: 0. No   Patient's Response To:  Health Literacy and Transportation Is the patient able to respond to health literacy and transportation needs?: Yes Health Literacy - How often do you need to have someone help you when you read instructions, pamphlets, or other written material from your doctor or pharmacy?: Never In the past 12 months, has lack of transportation kept you from medical appointments or from getting medications?: No In the past 12 months, has lack of transportation kept you from meetings, work, or from getting things needed for daily living?: No   Home Assistive Devices / Equipment Home Equipment: Conservation officer, nature (2 wheels), Shower seat, [facility]   Prior Device Use: Indicate devices/aids used by the patient prior to current illness, exacerbation or injury? Walker   Current Functional Level Cognition   Overall Cognitive Status: Within Functional Limits for tasks assessed Orientation Level: Oriented X4 General Comments: Very motivated, good participation    Extremity Assessment (includes Sensation/Coordination)   Upper Extremity Assessment: Overall WFL for tasks assessed  Lower Extremity Assessment: Defer  to PT evaluation RLE Deficits / Details: 2+/5 hip flexion, 2+/5 knee extension, 2+/5 knee flexion, 3+/5 hip abd/add RLE Coordination: decreased gross motor LLE Deficits / Details: 2+/5 hip flexion, 3/5 knee extension, 2+/5 knee flexion, 3+/5 hip abd/add LLE Coordination: decreased gross motor     ADLs   Overall ADL's : Needs assistance/impaired Eating/Feeding: Set up, Sitting Grooming: Oral care, Minimal assistance, Standing, Wash/dry face, Min guard, Sitting Grooming Details (indicate cue type and reason): Pt performing oral care while standing at sink with Min A for balance. Pt requiring significant effort and maintaining UE support due to decreased balance. Sitting at [facility] for rest break. Performing face hygiene while seated Upper Body Bathing: Set up, Sitting Upper Body Bathing Details (indicate cue type and reason): bathed while sitting at the sink Lower Body Bathing: Maximal assistance, Sit to/from stand Lower Body Bathing Details (indicate cue type and reason): pt able to stand at the sink while therapist and his wife assisted with peri area bathing. Pt can bathe thighs to knees while sitting Upper Body Dressing : Set up, Sitting Lower Body Dressing: Moderate assistance, Sit to/from stand Lower Body Dressing Details (indicate cue type and reason): Pt able to bring ankles to knees for donning of shoes. Requiring assistance to tie and wrap L ankle brace after pt donned over foot. Assistance for managing back of R shoe Toilet Transfer: Minimal assistance, Ambulation, BSC/3in1, Rolling walker (2 wheels) Toilet Transfer Details (indicate cue type and reason): Min A for power up and gaining balance. Very slow and requiring significant effort. But very motivated despite weakness and pain Toileting - Clothing Manipulation Details (indicate cue type and reason): Pt managing urinal at EOB with increased time Functional mobility during ADLs: Minimal assistance, Rolling walker (2 wheels) General ADL  Comments: Very slow and requiring significant effort. But very motivated despite weakness and pain     Mobility   Overal bed mobility: Needs Assistance Bed Mobility: Rolling, Sit to Sidelying Rolling: Min guard Sidelying to sit: Min assist Sit to sidelying: Mod assist, HOB elevated General bed mobility comments: cues for proper body mechanics at times, minA for BLE to advance safely to EOB but pt with good initiation/use of railing for trunk raise     Transfers   Overall transfer level:  Needs assistance Equipment used: Rolling walker (2 wheels) Transfers: Sit to/from Stand, Bed to chair/wheelchair/BSC Sit to Stand: Mod assist, From elevated surface Bed to/from chair/wheelchair/BSC transfer type:: Step pivot Step pivot transfers: Min assist General transfer comment: able to stand with +1 assist from elevated bed height, second person present but not needing to assist to lift; minA for stepping around to chair ~71f with RW     Ambulation / Gait / Stairs / Wheelchair Mobility   Ambulation/Gait Ambulation/Gait assistance: +2 safety/equipment, Min assist (Chair follow) Gait Distance (Feet): 40 Feet (40 ft, seated break, 40 ft) Assistive device: Rolling walker (2 wheels) Gait Pattern/deviations: Step-through pattern, Decreased stride length, Trunk flexed General Gait Details: defer hallway gait progression due to lack of +2 assist at time of session and pt requesting to eat dinner, wants to walk to bathroom later in day after his meal. Gait velocity: decreased     Posture / Balance Dynamic Sitting Balance Sitting balance - Comments: reliant on UE support Balance Overall balance assessment: Needs assistance Sitting-balance support: Feet supported, Bilateral upper extremity supported Sitting balance-Leahy Scale: Fair Sitting balance - Comments: reliant on UE support Postural control: Posterior lean Standing balance support: Bilateral upper extremity supported, During functional activity,  Reliant on assistive device for balance Standing balance-Leahy Scale: Poor Standing balance comment: Reliant on BUE for balance     Special needs/care consideration Skin surgical incision: back    Previous Home Environment (from acute therapy documentation) Living Arrangements: Spouse/significant other  Lives With: Spouse Available Help at Discharge: Family, Available 24 hours/day Type of Home: House Home Layout: Two level, 1/2 bath on main level Alternate Level Stairs-Rails: Right Alternate Level Stairs-Number of Steps: 11 Home Access: Stairs to enter Entrance Stairs-Rails: Right Entrance Stairs-Number of Steps: 2 Bathroom Shower/Tub: WGaffer TChiropodist Handicapped height Bathroom Accessibility: Yes How Accessible: Accessible via wheelchair Home Care Services: No   Discharge Living Setting Plans for Discharge Living Setting: Patient's home Type of Home at Discharge: House Discharge Home Layout: Two level, 1/2 bath on main level Alternate Level Stairs-Rails: Right Alternate Level Stairs-Number of Steps: 11 Discharge Home Access: Stairs to enter Entrance Stairs-Rails: Right Entrance Stairs-Number of Steps: 2 Discharge Bathroom Shower/Tub: Walk-in shower, Tub/shower unit Discharge Bathroom Toilet: Handicapped height Discharge Bathroom Accessibility: Yes How Accessible: Accessible via walker Does the patient have any problems obtaining your medications?: No   Social/Family/Support Systems Anticipated Caregiver: CShemuel Harkleroad wife   Goals Patient/Family Goal for Rehab: Supervision-Min A: PT/OT Expected length of stay: 14-16 days Pt/Family Agrees to Admission and willing to participate: Yes Program Orientation Provided & Reviewed with Pt/Caregiver Including Roles  & Responsibilities: Yes   Decrease burden of Care through IP rehab admission: NA   Possible need for SNF placement upon discharge: Not anticipated   Patient Condition: [facility] , and patient. I met with patient at the bedside for inpatient rehabilitation assessment.  Patient will benefit from ongoing PT and OT, can actively participate in 3 hours of therapy a day 5 days of the week, and can make measurable gains during the admission.  Patient will also benefit from the coordinated team approach during an Inpatient Acute Rehabilitation admission.  The patient will receive intensive therapy as well as Rehabilitation physician, nursing, social worker, and care management interventions.  Due to safety, skin/wound care, disease management, medication administration, pain management, and patient education the patient requires 24 hour a day rehabilitation nursing.  The patient is currently min asisst with mobility and basic ADLs.  Discharge setting and therapy post discharge at home with home health is anticipated.  Patient has agreed to participate in the Acute Inpatient Rehabilitation Program and will admit today.   Preadmission Screen Completed By:  Shann Medal, PT, DPT and Bethel Born, 08/21/2021 10:22 AM ______________________________________________________________________   Discussed status with Dr. Letta Pate on 08/21/21  at 10:22 AM  and received approval for admission today.   Admission Coordinator:  Shann Medal, PT, DPT, Gayland Curry, time 10:22 AM Sudie Grumbling 08/21/21     Assessment/Plan: Diagnosis:  Thoracic myelopathy with paraplegia  Does the need for close, 24 hr/day Medical supervision in concert with the patient's rehab needs make it unreasonable for this patient to be served in a less intensive setting? Yes Co-Morbidities requiring supervision/potential complications: DM, HTN, Lumbar spinal stenosis Due to bladder management, bowel management, safety, skin/wound care, disease management, medication administration, pain management, and patient education, does the patient require 24  hr/day rehab nursing? Yes Does the patient require coordinated care of a physician, rehab nurse, PT, OT, and SLP to address physical and functional deficits in the context of the above medical diagnosis(es)? Yes Addressing deficits in the following areas: balance, endurance, locomotion, strength, transferring, bowel/bladder control, bathing, dressing, and feeding Can the patient actively participate in an intensive therapy program of at least 3 hrs of therapy 5 days a week? Yes The potential for patient to make measurable gains while on inpatient rehab is good Anticipated functional outcomes upon discharge from inpatient rehab: min assist PT, min assist OT, n/a SLP Estimated rehab length of stay to reach the above functional goals is: 14-16d Anticipated discharge destination: Home 10. Overall Rehab/Functional Prognosis: good     MD Signature: Charlett Blake M.D. [place] [employer] [facility] Fellow Am Acad of Phys Med and Rehab Diplomate Am Board of Electrodiagnostic Med Fellow Am Board of Interventional Pain

## 2021-08-22 DIAGNOSIS — M4714 Other spondylosis with myelopathy, thoracic region: Secondary | ICD-10-CM | POA: Diagnosis not present

## 2021-08-22 LAB — CBC WITH DIFFERENTIAL/PLATELET
Abs Immature Granulocytes: 0.02 10*3/uL (ref 0.00–0.07)
Basophils Absolute: 0 10*3/uL (ref 0.0–0.1)
Basophils Relative: 0 %
Eosinophils Absolute: 0.3 10*3/uL (ref 0.0–0.5)
Eosinophils Relative: 5 %
HCT: 32.2 % — ABNORMAL LOW (ref 39.0–52.0)
Hemoglobin: 10.3 g/dL — ABNORMAL LOW (ref 13.0–17.0)
Immature Granulocytes: 0 %
Lymphocytes Relative: 22 %
Lymphs Abs: 1.2 10*3/uL (ref 0.7–4.0)
MCH: 30.8 pg (ref 26.0–34.0)
MCHC: 32 g/dL (ref 30.0–36.0)
MCV: 96.4 fL (ref 80.0–100.0)
Monocytes Absolute: 0.7 10*3/uL (ref 0.1–1.0)
Monocytes Relative: 13 %
Neutro Abs: 3.3 10*3/uL (ref 1.7–7.7)
Neutrophils Relative %: 60 %
Platelets: 296 10*3/uL (ref 150–400)
RBC: 3.34 MIL/uL — ABNORMAL LOW (ref 4.22–5.81)
RDW: 15.6 % — ABNORMAL HIGH (ref 11.5–15.5)
WBC: 5.6 10*3/uL (ref 4.0–10.5)
nRBC: 0 % (ref 0.0–0.2)

## 2021-08-22 LAB — COMPREHENSIVE METABOLIC PANEL
ALT: 15 U/L (ref 0–44)
AST: 19 U/L (ref 15–41)
Albumin: 3.1 g/dL — ABNORMAL LOW (ref 3.5–5.0)
Alkaline Phosphatase: 78 U/L (ref 38–126)
Anion gap: 9 (ref 5–15)
BUN: 25 mg/dL — ABNORMAL HIGH (ref 8–23)
CO2: 23 mmol/L (ref 22–32)
Calcium: 9.4 mg/dL (ref 8.9–10.3)
Chloride: 108 mmol/L (ref 98–111)
Creatinine, Ser: 1.52 mg/dL — ABNORMAL HIGH (ref 0.61–1.24)
GFR, Estimated: 49 mL/min — ABNORMAL LOW (ref >60)
Glucose, Bld: 115 mg/dL — ABNORMAL HIGH (ref 70–99)
Potassium: 4.2 mmol/L (ref 3.5–5.1)
Sodium: 140 mmol/L (ref 135–145)
Total Bilirubin: 0.6 mg/dL (ref 0.3–1.2)
Total Protein: 7 g/dL (ref 6.5–8.1)

## 2021-08-22 MED ORDER — METAXALONE 800 MG PO TABS
800.0000 mg | ORAL_TABLET | Freq: Three times a day (TID) | ORAL | Status: DC | PRN
  Administered 2021-08-22: 800 mg via ORAL
  Filled 2021-08-22 (×2): qty 1

## 2021-08-22 NOTE — Progress Notes (Signed)
Skilled nurse educated/encourage patient to turn and reposition every 2 hours to relieve pressure from incision and bony prominences. Patient verbalized understanding and was able to provided return demonstration. Oncoming nurse notified of education provided this shift/documented on white board in patient's room.

## 2021-08-22 NOTE — Progress Notes (Addendum)
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  Constant numbness but a little better since surgery. 60/40 weakness/numbness as issue.  Peeing OK- no constipation- LBM yesterday  ROS:  Pt denies SOB, abd pain, CP, N/V/C/D, and vision changes    Objective:   No results found. Recent Labs    08/22/21 0436  WBC 5.6  HGB 10.3*  HCT 32.2*  PLT 296   Recent Labs    08/21/21 1639 08/22/21 0436  NA  --  140  K  --  4.2  CL  --  108  CO2  --  23  GLUCOSE  --  115*  BUN  --  25*  CREATININE 1.60* 1.52*  CALCIUM  --  9.4    Intake/Output Summary (Last 24 hours) at 08/22/2021 1049 Last data filed at 08/22/2021 0700 Gross per 24 hour  Intake 446 ml  Output 1000 ml  Net -554 ml        Physical Exam: Vital Signs Blood pressure 103/70, pulse 72, temperature 97.8 F (36.6 C), resp. rate 18, height 6' (1.829 m), weight 125.8 kg, SpO2 96 %.   General: awake, alert, appropriate, sitting u in bed; OT in room; NAD HENT: conjugate gaze; oropharynx moist CV: regular rate; no JVD Pulmonary: CTA B/L; no W/R/R- good air movement GI: soft, NT, ND, (+)BS Psychiatric: appropriate- talkative Neurological: [name] MS: muscle spasm in Left low back/mid back Skin: No evidence of breakdown, no evidence of rash- incision on back C/D/I-  Neurologic: Cranial nerves II through XII intact, motor strength is 5/5 in bilateral deltoid, bicep, tricep, grip, 2-hip flexor,3- knee extensors, 2- ankle dorsiflexor and plantar flexor Sensory exam normal sensation to light touch and reduced Right toe proprioception in bilateral upper and lower extremities   Assessment/Plan: 1. Functional deficits which require 3+ hours per day of interdisciplinary therapy in a comprehensive  inpatient rehab setting. Physiatrist is providing close team supervision and 24 hour management of active medical problems listed below. Physiatrist and rehab team continue to assess barriers to discharge/monitor patient progress toward functional and medical goals  Care Tool:  Bathing              Bathing assist       Upper Body Dressing/Undressing Upper body dressing        Upper body assist      Lower Body Dressing/Undressing Lower body dressing            Lower body assist       Toileting Toileting    Toileting assist Assist for toileting: Independent with assistive device (Pt used urinal)     Transfers Chair/bed transfer  Transfers assist           Locomotion Ambulation   Ambulation assist              Walk 10 feet activity   Assist           Walk 50 feet activity   Assist  Walk 150 feet activity   Assist           Walk 10 feet on uneven surface  activity   Assist           Wheelchair     Assist               Wheelchair 50 feet with 2 turns activity    Assist            Wheelchair 150 feet activity     Assist          Blood pressure 103/70, pulse 72, temperature 97.8 F (36.6 C), resp. rate 18, height 6' (1.829 m), weight 125.8 kg, SpO2 96 %.    Medical Problem List and Plan: 1.  Decline in self care and mobility  secondary to THoracic myelopathy             -patient may shower             -ELOS/Goals: 14-16d Sup/Min A  First day of evaluations- team conference to determine length of stay 2.  Antithrombotics: -DVT/anticoagulation:  Pharmaceutical: Lovenox             -antiplatelet therapy: N/A 3. Chronic Pain Management:  Sees Dr. Glenford Bayley on oxycodone 5 mg qid, gabapentin and Cymbalta.   --Continue Oxycodone prn 11/15- will add Skelaxin 800 mg TID prn- mentioned that likely cannot get at home due to cost, but will do here since not sedating - if doesn't  work, will do trigger point injections on Thursday.  4. Mood: LCSW to follow for evaluation and support.              -antipsychotic agents: N/A 5. Neuropsych: This patient is capable of making decisions on his own behalf. 6. Skin/Wound Care: Monitor wound for healing.  7. Fluids/Electrolytes/Nutrition: Monitor I/O.  8. T2DM: Hgb A1c-6.2 and BS controlled on low dose metformin --Monitor BS ac/hs and use SSI as needed.  11/15- BG's overall OK based on BMP- con't regimen -- Continue metformin daily  9. HTN; BP TID--on atenolol/hygroton and low dose catapres.   11/15- BP high prior to BP meds- but 103/70 after meds- con't regimen- but if feels dizzy, will decrease BPmeds 11. Neuropathy: continue Gabapentin TID. 12. Dyslipidemia: ON Zocor 13. AKI and ABLA-  Will recheck labs in am.    11/15- Hb stable at 10.3; Cr slightly better at 1.52- but still elevated- will push fluids and recheck Thursday- if still elevated, use IVFs.      I spent a total of 37 minutes on total care- >50% on coordination of care- in d/w pt about his "biography" and team conference and d/w PA about his muscle spasms    LOS: 1 days A FACE TO FACE EVALUATION WAS PERFORMED  Keith [name] 08/22/2021, 10:49 AM

## 2021-08-22 NOTE — Progress Notes (Signed)
Occupational Therapy Session Note  Patient Details  Name: Keith [name] MRN: [id number] Date of Birth: 1951/05/11  Today's Date: 08/22/2021 OT Individual Time: 1130-1155 OT Individual Time Calculation (min): 25 min    Short Term Goals: Week 1:     Skilled Therapeutic Interventions/Progress Updates:    Intervention after consultation with evaluating OTR regarding POC. OT intervention with focus on discharge planning, review of role of OT, and OT LTG at discharge. Pt familiar with rehab process (previous pt for ankle factture.) Pt agreeable to taking a shower 11/16. Pt with walk-in shower and shower seat. Pt also has BSC. Pt remained in recliner with all needs within reach.   Therapy Documentation Precautions:  Restrictions Weight Bearing Restrictions: No Pain: Pain Assessment Pain Scale: 0-10 Pain Score: 0-No pain   Therapy/Group: Individual Therapy  Leroy Libman 08/22/2021, 12:10 PM

## 2021-08-22 NOTE — Evaluation (Signed)
Physical Therapy Assessment and Plan  Patient Details  Name: Keith [name] MRN: [id number] Date of Birth: 1951/03/06  PT Diagnosis: Abnormal posture, Abnormality of gait, Difficulty walking, Impaired sensation, Low back pain, and Muscle spasms Rehab Potential: Good ELOS: 10-12 days   Today's Date: 08/22/2021 PT Individual Time: 8841-6606 PT Individual Time Calculation (min): 85 min    Hospital Problem: Principal Problem:   Thoracic myelopathy   Past Medical History:  Past Medical History:  Diagnosis Date   Allergy    Chronic back pain    Diabetes mellitus without complication ([place])    DNR (do not resuscitate) 11/19/2020   Duodenal ulcer hemorrhage 08/29/2014   ED (erectile dysfunction)    Esophageal stricture 08/30/2014   Glucose intolerance (impaired glucose tolerance)    Hiatal hernia 08/30/2014   Hypertension    Hypokalemia 04/11/2013   MRSA carrier 08/30/2014   Obesity    Osteoarthritis    Pneumonia    Pulmonary embolism ([place]) 06/2020   Scoliosis 2016   Spinal stenosis of lumbar region    Urinary tract infection 04/11/2013   Past Surgical History:  Past Surgical History:  Procedure Laterality Date   COLONOSCOPY  2008   ESOPHAGOGASTRODUODENOSCOPY N/A 08/29/2014   Procedure: ESOPHAGOGASTRODUODENOSCOPY (EGD);  Surgeon: Lafayette Dragon, MD;  Location: [facility];  Service: Endoscopy;  Laterality: N/A;   LAMINECTOMY N/A 07/28/2018   Procedure: THORACIC ELEVEN- THORACIC TWELVE POSTERIOR DECOMPRESSION LAMINECTOMY;  Surgeon: Judith Part, MD;  Location: [place];  Service: Neurosurgery;  Laterality: N/A;   LUMBAR LAMINECTOMY     NASAL POLYP SURGERY     ORIF ANKLE FRACTURE Right 07/01/2020   Procedure: OPEN REDUCTION INTERNAL FIXATION (ORIF) ANKLE FRACTURE. TRIMALLEOLAR;  Surgeon: Marchia Bond, MD;  Location: [place];  Service: Orthopedics;  Laterality: Right;   TONSILLECTOMY      Assessment & Plan Clinical Impression:  Keith [name] is a 70 year old male  with history of HTN, T2DM, dyslipidemia who was admitted on 08/07/21 with 6 month history of progressive back pain with numbness BLE who reported having had  ESI a few weeks PTA with resolution of back pain but then developed progressive weakness. MRI brain done revealing chronic thoracic spine ankylosis T7-T11 with severe degeneration of T11-T12 moderate to severe spinal stenosis and subsequent cord compression T11-T12 with progression of focal myelomalacia.  He was taken to the OR for T11-T12 laminectomy with right T11-T12 facet and posterior spinal fusion by Dr. Venetia Constable on 08/09/21.  Postop reporting improvement in BLE strength but continues to be limited by balance deficits with weakness, sensory deficits as well as decreased endurance.  CIR recommended due to functional decline. Patient transferred to CIR on 08/21/2021 .   Patient currently requires min to occasional mod A with mobility secondary to muscle weakness and muscle joint tightness, decreased cardiorespiratoy endurance, and decreased standing balance, decreased postural control, and decreased balance strategies.  Prior to hospitalization, patient was modified independent  with mobility and lived with Spouse in a House home.  Home access is 2Stairs to enter.  Patient will benefit from skilled PT intervention to maximize safe functional mobility, minimize fall risk, and decrease caregiver burden for planned discharge home with 24 hour assist.  Anticipate patient will benefit from follow up [facility] at discharge.  PT - End of Session Activity Tolerance: Tolerates 30+ min activity with multiple rests Endurance Deficit: Yes Endurance Deficit Description: frequent rest breaks during functional activity PT Assessment Rehab Potential (ACUTE/IP ONLY): Good PT Barriers to Discharge:  Home environment access/layout PT Patient demonstrates impairments in the following area(s): Balance;Endurance;Motor;Pain;Safety;Sensory PT Transfers Functional  Problem(s): Bed Mobility;Bed to Chair;Car;Furniture;Floor PT Locomotion Functional Problem(s): Ambulation;Wheelchair Mobility;Stairs PT Plan PT Intensity: Minimum of 1-2 x/day ,45 to 90 minutes PT Frequency: 5 out of 7 days PT Duration Estimated Length of Stay: 10-12 days PT Treatment/Interventions: Ambulation/gait training;Balance/vestibular training;Discharge planning;Disease management/prevention;DME/adaptive equipment instruction;Functional electrical stimulation;Functional mobility training;Neuromuscular re-education;Pain management;Patient/family education;Splinting/orthotics;Stair training;Therapeutic Activities;Therapeutic Exercise;UE/LE Strength taining/ROM;UE/LE Coordination activities;Wheelchair propulsion/positioning PT Transfers Anticipated Outcome(s): Supervision PT Locomotion Anticipated Outcome(s): Supervision with LRAD at ambulatory level PT Recommendation Recommendations for Other Services: Therapeutic Recreation consult Therapeutic Recreation Interventions: Stress management Follow Up Recommendations: Home health PT Patient destination: Home Equipment Recommended: None recommended by PT Equipment Details: pt already owns RW   PT Evaluation Precautions/Restrictions Precautions Precautions: Back Precaution Comments: 3/3 recalled Required Braces or Orthoses: Other Brace (L ankle) Spinal Brace Comments: No brace per MD note Other Brace: Utilized lace up ankle brace today that pt brought from home Restrictions Weight Bearing Restrictions: No Pain Interference Pain Interference Pain Effect on Sleep: 3. Frequently Pain Interference with Therapy Activities: 3. Frequently Pain Interference with Day-to-Day Activities: 3. Frequently Home Living/Prior Functioning Home Living Available Help at Discharge: Family;Available 24 hours/day Type of Home: House Home Access: Stairs to enter [employer] of Steps: 2 Entrance Stairs-Rails: Right Home Layout: Two level;1/2  bath on main level Alternate Level Stairs-Number of Steps: 11 Alternate Level Stairs-Rails: Right Bathroom Shower/Tub: Walk-in shower;Tub/shower unit Bathroom Toilet: Handicapped height Bathroom Accessibility: Yes  Lives With: Spouse Prior Function Level of Independence: Independent with gait;Independent with transfers;Requires assistive device for independence  Able to Take Stairs?: Yes Driving: No Vocation: Retired Radiographer, therapeutic - History Baseline Vision: Wears glasses all the time Ability to See in Adequate Light: 0 Adequate Patient Visual Report: No change from baseline Perception Perception: Within Functional Limits Praxis Praxis: Intact  Cognition Overall Cognitive Status: Within Functional Limits for tasks assessed Arousal/Alertness: Awake/alert Orientation Level: Oriented X4 Year: 2022 Month: November Day of Week: Correct Attention: Focused;Sustained Focused Attention: Appears intact Sustained Attention: Appears intact Memory: Appears intact Immediate Memory Recall: Sock;Blue;Bed Memory Recall Sock: Without Cue Memory Recall Blue: Without Cue Memory Recall Bed: Without Cue Awareness: Appears intact Problem Solving: Appears intact Safety/Judgment: Appears intact Sensation Sensation Light Touch: Impaired Detail Light Touch Impaired Details: Impaired RLE;Impaired LLE Proprioception: Impaired Detail Proprioception Impaired Details: Impaired RLE;Impaired LLE Coordination Gross Motor Movements are Fluid and Coordinated: No Fine Motor Movements are Fluid and Coordinated: Yes Coordination and Movement Description: grossly uncoordinated 2/2 BLE weakness Finger Nose Finger Test: WFL Heel Shin Test: impaired BLE Motor  Motor Motor: Abnormal postural alignment and control Motor - Skilled Clinical Observations: grossly limited 2/2 BLE weakness and back precautions  Trunk/Postural Assessment  Cervical Assessment Cervical Assessment: Within Functional  Limits Thoracic Assessment Thoracic Assessment: Exceptions to [facility] (back precautions) Lumbar Assessment Lumbar Assessment: Exceptions to [facility] (back precautions) Postural Control Postural Control: Deficits on evaluation (insufficient)  Balance Balance Balance Assessed: Yes Static Sitting Balance Static Sitting - Balance Support: No upper extremity supported;Feet supported Static Sitting - Level of Assistance: 5: Stand by assistance Dynamic Sitting Balance Dynamic Sitting - Balance Support: No upper extremity supported;Feet supported;During functional activity Dynamic Sitting - Level of Assistance: 5: Stand by assistance Dynamic Sitting - Balance Activities: Reaching across midline;Forward lean/weight shifting;Lateral lean/weight shifting;Reaching for objects Static Standing Balance Static Standing - Balance Support: Bilateral upper extremity supported;During functional activity Static Standing - Level of Assistance: 5: Stand by assistance Dynamic Standing  Balance Dynamic Standing - Balance Support: Bilateral upper extremity supported;During functional activity Dynamic Standing - Level of Assistance: 4: Min assist Extremity Assessment  RUE Assessment RUE Assessment: Within Functional Limits General Strength Comments: WFL for ADL tasks LUE Assessment LUE Assessment: Within Functional Limits General Strength Comments: WFL for ADL tasks RLE Assessment RLE Assessment: Exceptions to [facility] Passive Range of Motion (PROM) Comments: tight HS General Strength Comments: impaired, see below RLE Strength Right Hip Flexion: 3-/5 Right Knee Flexion: 2/5 Right Knee Extension: 4/5 Right Ankle Dorsiflexion: 4/5 LLE Assessment LLE Assessment: Exceptions to [facility] Passive Range of Motion (PROM) Comments: tight HS General Strength Comments: impaired, see below LLE Strength Left Hip Flexion: 3-/5 Left Knee Flexion: 3/5 Left Knee Extension: 4/5 Left Ankle Dorsiflexion: 4/5  Care Tool Care Tool Bed  Mobility Roll left and right activity   Roll left and right assist level: Minimal Assistance - Patient > 75%    Sit to lying activity   Sit to lying assist level: Minimal Assistance - Patient > 75%    Lying to sitting on side of bed activity   Lying to sitting on side of bed assist level: the ability to move from lying on the back to sitting on the side of the bed with no back support.: Minimal Assistance - Patient > 75%     Care Tool Transfers Sit to stand transfer   Sit to stand assist level: Moderate Assistance - Patient 50 - 74%    Chair/bed transfer   Chair/bed transfer assist level: Minimal Assistance - Patient > 75%     Physiological scientist transfer assist level: Moderate Assistance - Patient 50 - 74%      Care Tool Locomotion Ambulation   Assist level: Minimal Assistance - Patient > 75% Assistive device: Walker-rolling Max distance: 48'  Walk 10 feet activity   Assist level: Minimal Assistance - Patient > 75% Assistive device: Walker-rolling   Walk 50 feet with 2 turns activity Walk 50 feet with 2 turns activity did not occur: Safety/medical concerns      Walk 150 feet activity Walk 150 feet activity did not occur: Safety/medical concerns      Walk 10 feet on uneven surfaces activity Walk 10 feet on uneven surfaces activity did not occur: Safety/medical concerns      Stairs Stair activity did not occur: Safety/medical concerns        Walk up/down 1 step activity Walk up/down 1 step or curb (drop down) activity did not occur: Safety/medical concerns      Walk up/down 4 steps activity Walk up/down 4 steps activity did not occur: Safety/medical concerns      Walk up/down 12 steps activity Walk up/down 12 steps activity did not occur: Safety/medical concerns      Pick up small objects from floor Pick up small object from the floor (from standing position) activity did not occur: Safety/medical concerns      Wheelchair Is the patient  using a wheelchair?:  (TBD) Type of Wheelchair: Manual   Wheelchair assist level: Supervision/Verbal cueing Max wheelchair distance: 50'  Wheel 50 feet with 2 turns activity   Assist Level: Supervision/Verbal cueing  Wheel 150 feet activity   Assist Level: Moderate Assistance - Patient 50 - 74%    Refer to Care Plan for Long Term Goals  SHORT TERM GOAL WEEK 1 PT Short Term Goal 1 (Week 1): Pt will complete least restrictive transfers with min  A consistently PT Short Term Goal 2 (Week 1): Pt will perform bed mobility at Supervision level consistently while adhering to spinal precautions PT Short Term Goal 3 (Week 1): Pt will ambulate x 100 ft with LRAD and CGA PT Short Term Goal 4 (Week 1): Pt will initiate stair training  Recommendations for other services: Therapeutic Recreation  Stress management  Skilled Therapeutic Intervention Evaluation completed (see details above and below) with education on PT POC and goals and individual treatment initiated with focus on functional transfer assessment, gait assessment, w/c mobility assessment, and orientation to rehab unit and schedule. Pt familiar with rehab process from previous stay on unit. Pt received seated in recliner in room, agreeable to PT session. Pt reports some pain in low back at rest, not rated and premedicated prior to start of therapy session. Pt also reports intermittent pain in L lower back from trigger points that interferes with functional mobility at time. Sit to stand initially with mod A from recliner to RW. Ambulation x 25 ft, x 48 ft with RW and min A for balance. Pt exhibited flexed trunk, elevated shoulders, and L ankle eversion during gait. Pt wears a L ankle brace from home for prior L ankle injury. Car transfer with mod A needed to bring LE in/out of car. Manual w/c propulsion x 50 ft with use of BUE at Supervision level. Pt left seated in recliner in room with needs in reach at end of session.  Mobility Bed  Mobility Bed Mobility: Rolling Right;Rolling Left;Supine to Sit;Sit to Supine Rolling Right: Minimal Assistance - Patient > 75% Rolling Left: Minimal Assistance - Patient > 75% Supine to Sit: Minimal Assistance - Patient > 75% Sitting - Scoot to Edge of Bed: Contact Guard/Touching assist Sit to Supine: Minimal Assistance - Patient > 75% Transfers Transfers: Sit to [employer] Transfers Sit to Stand: Moderate Assistance - Patient 50-74% Stand to Sit: Minimal Assistance - Patient > 75% Stand Pivot Transfers: Minimal Assistance - Patient > 75% Stand Pivot Transfer Details: Verbal cues for technique;Verbal cues for safe use of DME/AE;Verbal cues for sequencing;Verbal cues for precautions/safety Transfer (Assistive device): Rolling walker Locomotion  Gait Gait Distance (Feet): 48 Feet Assistive device: Rolling walker Gait Gait Pattern: Impaired (elevated shoulders, L ankle everted) Gait velocity: decreased Stairs / Additional Locomotion Stairs: No Wheelchair Mobility Wheelchair Mobility: Yes Wheelchair Assistance: Chartered loss adjuster: Both upper extremities Wheelchair Parts Management: Needs assistance Distance: 50   Discharge Criteria: Patient will be discharged from PT if patient refuses treatment 3 consecutive times without medical reason, if treatment goals not met, if there is a change in medical status, if patient makes no progress towards goals or if patient is discharged from hospital.  The above assessment, treatment plan, treatment alternatives and goals were discussed and mutually agreed upon: by patient   Excell Seltzer, PT, DPT, CSRS 08/22/2021, 5:45 PM

## 2021-08-22 NOTE — Evaluation (Signed)
Occupational Therapy Assessment and Plan  Patient Details  Name: Keith [name] MRN: [id number] Date of Birth: [birth year]  OT Diagnosis: abnormal posture, acute pain, lumbago (low back pain), and muscle weakness (generalized) Rehab Potential:   ELOS: 10-12 days   Today's Date: 08/22/2021 OT Individual Time:  22- 875 64 mins       Hospital Problem: Principal Problem:   Thoracic myelopathy   Past Medical History:  Past Medical History:  Diagnosis Date   Allergy    Chronic back pain    Diabetes mellitus without complication ([place])    DNR (do not resuscitate) 11/19/2020   Duodenal ulcer hemorrhage 08/29/2014   ED (erectile dysfunction)    Esophageal stricture 08/30/2014   Glucose intolerance (impaired glucose tolerance)    Hiatal hernia 08/30/2014   Hypertension    Hypokalemia 04/11/2013   MRSA carrier 08/30/2014   Obesity    Osteoarthritis    Pneumonia    Pulmonary embolism ([place]) 06/2020   Scoliosis 2016   Spinal stenosis of lumbar region    Urinary tract infection 04/11/2013   Past Surgical History:  Past Surgical History:  Procedure Laterality Date   COLONOSCOPY  2008   ESOPHAGOGASTRODUODENOSCOPY N/A 08/29/2014   Procedure: ESOPHAGOGASTRODUODENOSCOPY (EGD);  Surgeon: Lafayette Dragon, MD;  Location: [facility];  Service: Endoscopy;  Laterality: N/A;   LAMINECTOMY N/A 07/28/2018   Procedure: THORACIC ELEVEN- THORACIC TWELVE POSTERIOR DECOMPRESSION LAMINECTOMY;  Surgeon: Judith Part, MD;  Location: [facility];  Service: Neurosurgery;  Laterality: N/A;   LUMBAR LAMINECTOMY     NASAL POLYP SURGERY     ORIF ANKLE FRACTURE Right 07/01/2020   Procedure: OPEN REDUCTION INTERNAL FIXATION (ORIF) ANKLE FRACTURE. TRIMALLEOLAR;  Surgeon: Marchia Bond, MD;  Location: [facility];  Service: Orthopedics;  Laterality: Right;   TONSILLECTOMY      Assessment & Plan Clinical Impression: Keith [name] is a 70 year old male with history of HTN, T2DM, dyslipidemia who was admitted  on 08/07/21 with 6 month history of progressive back pain with numbness BLE who reported having had  ESI a few weeks PTA with resolution of back pain but then developed progressive weakness. MRI brain done revealing chronic thoracic spine ankylosis T7-T11 with severe degeneration of T11-T12 moderate to severe spinal stenosis and subsequent cord compression T11-T12 with progression of focal myelomalacia.  He was taken to the OR for T11-T12 laminectomy with right T11-T12 facet and posterior spinal fusion by Dr. Venetia Constable on 08/09/21.  Postop reporting improvement in BLE strength but continues to be limited by balance deficits with weakness, sensory deficits as well as decreased endurance.  CIR recommended due to functional decline. Patient transferred to CIR on 08/21/2021 .    Patient currently requires Min - mod with basic self-care skills secondary to muscle weakness, decreased cardiorespiratoy endurance, impaired timing and sequencing, decreased coordination, and decreased motor planning, and decreased sitting balance, decreased standing balance, decreased postural control, and decreased balance strategies.  Prior to hospitalization, patient could complete BADL with modified independent . Wife occasionally assisted with LB.  Patient will benefit from skilled intervention to decrease level of assist with basic self-care skills and increase independence with basic self-care skills prior to discharge home with care partner.  Anticipate patient will require intermittent supervision and follow up home health.  OT - End of Session Activity Tolerance: Tolerates 30+ min activity with multiple rests Endurance Deficit: Yes OT Assessment OT Barriers to Discharge: Home environment access/layout OT Barriers to Discharge Comments: B&B upstairs with 1/2 bath  on main OT Patient demonstrates impairments in the following area(s): Balance;Edema;Endurance;Pain;Motor;Safety OT Basic ADL's Functional Problem(s):  Grooming;Bathing;Dressing;Toileting OT Transfers Functional Problem(s): Toilet;Tub/Shower OT Additional Impairment(s): None OT Plan OT Intensity: Minimum of 1-2 x/day, 45 to 90 minutes OT Frequency: 5 out of 7 days OT Duration/Estimated Length of Stay: 10-12 days OT Treatment/Interventions: Balance/vestibular training;Discharge planning;Pain management;Self Care/advanced ADL retraining;Therapeutic Activities;UE/LE Coordination activities;Visual/perceptual remediation/compensation;Therapeutic Exercise;Skin care/wound managment;Patient/family education;Functional mobility training;Disease mangement/prevention;Community reintegration;DME/adaptive equipment instruction;Neuromuscular re-education;Psychosocial support;Splinting/orthotics;UE/LE Strength taining/ROM;Wheelchair propulsion/positioning OT Self Feeding Anticipated Outcome(s): no goal OT Basic Self-Care Anticipated Outcome(s): Supervision OT Toileting Anticipated Outcome(s): Supervision OT Bathroom Transfers Anticipated Outcome(s): Supervision OT Recommendation Patient destination: Home Follow Up Recommendations: Home health OT Equipment Recommended: To be determined   OT Evaluation Precautions/Restrictions  Precautions Precautions: Back Precaution Comments: 3/3 recalled Required Braces or Orthoses: Other Brace Spinal Brace Comments: No brace per MD note Restrictions Weight Bearing Restrictions: No Vital Signs Therapy Vitals Temp: 98.5 F (36.9 C) Temp Source: Oral Pulse Rate: 84 Resp: 17 BP: 108/67 Patient Position (if appropriate): Sitting Oxygen Therapy SpO2: 100 % O2 Device: Room Air Pain Pain Assessment Pain Scale: 0-10 Pain Score: 2  Home Living/Prior Functioning Home Living Family/patient expects to be discharged to:: Private residence Living Arrangements: Spouse/significant other Available Help at Discharge: Family, Available 24 hours/day Type of Home: House Home Layout: Two level, 1/2 bath on main  level Bathroom Shower/Tub: Gaffer, Chiropodist: Handicapped height Bathroom Accessibility: Yes  Lives With: Spouse Prior Function Level of Independence: Independent with basic ADLs, Independent with homemaking with ambulation, Independent with gait, Independent with transfers  Able to Take Stairs?: Yes Vision Baseline Vision/History: 1 Wears glasses (per chart) Ability to See in Adequate Light: 0 Adequate Patient Visual Report: No change from baseline Vision Assessment?: No apparent visual deficits Perception  Perception: Within Functional Limits Praxis Praxis: Intact Cognition Overall Cognitive Status: Within Functional Limits for tasks assessed Arousal/Alertness: Awake/alert Orientation Level: Person;Place;Situation Person: Oriented Place: Oriented Situation: Oriented Year: 2022 Month: November Day of Week: Correct Memory: Appears intact Immediate Memory Recall: Sock;Blue;Bed Memory Recall Sock: Without Cue Memory Recall Blue: Without Cue Memory Recall Bed: Without Cue Attention: Focused;Sustained Focused Attention: Appears intact Sustained Attention: Appears intact Awareness: Appears intact Problem Solving: Appears intact Safety/Judgment: Appears intact Sensation Coordination Gross Motor Movements are Fluid and Coordinated: No Fine Motor Movements are Fluid and Coordinated: Yes Coordination and Movement Description: grossly uncoordinated 2/2 BLE weakness Finger Nose Finger Test: [facility] Motor  Motor Motor: Abnormal postural alignment and control Motor - Skilled Clinical Observations: grossly limited 2/2 BLE weakness and back precautions  Trunk/Postural Assessment  Cervical Assessment Cervical Assessment: Within Functional Limits Thoracic Assessment Thoracic Assessment: Exceptions to [facility] Lumbar Assessment Lumbar Assessment: Exceptions to [facility] Postural Control Postural Control: Deficits on evaluation  Balance Balance Balance Assessed:  Yes Dynamic Sitting Balance Dynamic Sitting - Balance Support: Feet supported;During functional activity Dynamic Sitting - Level of Assistance: 5: Stand by assistance Dynamic Sitting - Balance Activities: Reaching across midline;Forward lean/weight shifting;Lateral lean/weight shifting;Reaching for objects Static Standing Balance Static Standing - Balance Support: During functional activity Static Standing - Level of Assistance: 5: Stand by assistance Dynamic Standing Balance Dynamic Standing - Balance Support: During functional activity;Bilateral upper extremity supported Dynamic Standing - Level of Assistance: 4: Min assist Extremity/Trunk Assessment RUE Assessment RUE Assessment: Within Functional Limits General Strength Comments: WFL for ADL tasks LUE Assessment LUE Assessment: Within Functional Limits General Strength Comments: WFL for ADL tasks  Care Tool Care Tool Self Care Eating  Set up    Oral Care     Set up    Bathing      Min A        Upper Body Dressing(including orthotics)      Set up      Lower Body Dressing (excluding footwear)    Mod A      Putting on/Taking off footwear    Total A         Care Tool Toileting Toileting activity    Min A     Care Tool Bed Mobility Roll left and right activity    CGA    Sit to lying activity    CGA    Lying to sitting on side of bed activity    CGA     Care Tool Transfers Sit to stand transfer    Min A    Chair/bed transfer    Min A     Toilet transfer    Min A     Care Tool Cognition  Expression of Ideas and Wants Expression of Ideas and Wants: 4. Without difficulty (complex and basic) - expresses complex messages without difficulty and with speech that is clear and easy to understand  Understanding Verbal and Non-Verbal Content Understanding Verbal and Non-Verbal Content: 4. Understands (complex and basic) - clear comprehension without cues or repetitions   Memory/Recall Ability  Memory/Recall Ability : Current season;Staff names and faces;That he or she is in a hospital/hospital unit   Refer to Care Plan for [place] 1 OT Short Term Goal 1 (Week 1): Pt will perform BSC/toilet transfers with CGA and LRAD OT Short Term Goal 2 (Week 1): Pt will don LB clothing with AE PRN with CGA OT Short Term Goal 3 (Week 1): Pt will perform shower level bathing with CGA  Recommendations for other services: None    Skilled Therapeutic Intervention ADL ADL Eating: Not assessed Grooming: Setup Upper Body Bathing: Setup Lower Body Bathing: Minimal assistance Upper Body Dressing: Setup Lower Body Dressing: Moderate assistance Toileting: Minimal assistance (simulated) Toilet Transfer: Minimal assistance Mobility  Bed Mobility Bed Mobility: Sitting - Scoot to Edge of Bed;Supine to Sit Supine to Sit: Contact Guard/Touching assist Sitting - Scoot to Edge of Bed: Contact Guard/Touching assist Transfers Sit to Stand: Minimal Assistance - Patient > 75% Stand to Sit: Minimal Assistance - Patient > 75%   Skilled Interventions: Pt greeted at time of session semireclined in bed, agreeable to OT, no pain at rest but did have pain with mobility in L lower back at times and MD aware, believes to be trigger point. STM performed with some relief and RN present for med pass as well. Note pt very pleasant but hyperverbal, extended time for rapport building and redirecting to tasks. MD present as well during session with pt having several medical questions. Bed mobility CGA, walked short distance bed > sink level CGA/Min with RW. Seated in straight back chair for UB/LB bathing sink level with Min A only for past knee level, able to stand to wash buttocks and periarea within precautions. Able to recall 3/3 precautions without cues. UB dress set up, LB dress Mod A to thread 2/2 time constraints and back precautions. Ambulated short distance sink > recliner approx 10-15  feet with CGA/Min. Set up alarm on call bell in reach all needs met.    Discharge Criteria: Patient will be discharged from OT if patient refuses treatment 3 consecutive times without medical reason,  if treatment goals not met, if there is a change in medical status, if patient makes no progress towards goals or if patient is discharged from hospital.  The above assessment, treatment plan, treatment alternatives and goals were discussed and mutually agreed upon: by patient  Viona Gilmore 08/22/2021, 4:47 PM

## 2021-08-22 NOTE — Patient Care Conference (Signed)
Inpatient RehabilitationTeam Conference and Plan of Care Update Date: 08/22/2021   Time: 11:19 AM    Patient Name: Keith [name]      Medical Record Number: [id number]  Date of Birth: 03-Apr-1951 Sex: Male         Room/Bed: 5C18C/5C18C-01 Payor Info: Payor: [employer] / Plan: [facility] MEDICARE / Product Type: *No Product type* /    Admit Date/Time:  08/21/2021  4:09 PM  Primary Diagnosis:  Thoracic myelopathy  Hospital Problems: Principal Problem:   Thoracic myelopathy    Expected Discharge Date: Expected Discharge Date: 09/02/21  Team Members Present: Physician leading conference: Dr. Courtney Heys Social Worker Present: Loralee Pacas, [place] Nurse Present: Dorthula Nettles, RN PT Present: Excell Seltzer, PT OT Present: Roanna Epley, [place], OT PPS Coordinator present : Ileana Ladd, PT     Current Status/Progress Goal Weekly Team Focus  Bowel/Bladder   continent of b/b; LBM: 11/12 per pt  remain continent of b/b  assist with tolieting needs prn   Swallow/Nutrition/ Hydration             ADL's   OT eval pending  OT eval pending  OT eval pending   Mobility   PT Eval pending  PT Eval pending  PT Eval pending   Communication             Safety/Cognition/ Behavioral Observations            Pain   c/o pain to back; prn norco q4hr  pain level <5/10  assess pain q 4hr and prn   Skin   surgical incision to back, OTA with skin glue  maintain skin integrity with no new skin breakdown/infection  assess skin q shift and prn     Discharge Planning:  Pt to be assessed. Per EMR, pt to d/c to home with his wife.   Team Discussion: Having muscle spasms, ordered Skelaxin PRN. Patient may shower as long as incision is covered. Will do trigger point injections on Thursday if needed. BP high before medications. Cr elevated. Keeping IV due to possible need for fluids. Continent B/B. Rates pain 7/10. Will not get services due to insurance.   Patient on  target to meet rehab goals: Currently min assist with supervision goals. Evals pending.  *See Care Plan and progress notes for long and short-term goals.   Revisions to Treatment Plan:  Skelaxin PRN for muscle spasms, possible trigger point injections on Thursday.  Teaching Needs: Family education, medication/pain management, skin/wound care, safety awareness, transfer training, etc.  Current Barriers to Discharge: Decreased caregiver support, Home enviroment access/layout, Wound care, Lack of/limited family support, Weight, Weight bearing restrictions, and Medication compliance  Possible Resolutions to Barriers: Family education Order DME Follow-up HH/Outpatient therapy      Medical Summary Current Status: Cr 1.52; baseline 1.0; pain 7/10- continent of B/B; can shower; numbness/weakness are main limiters as well as pain  Barriers to Discharge: Weight bearing restrictions;Weight;Decreased family/caregiver support;Home enviroment access/layout;Medical stability;Wound care  Barriers to Discharge Comments: pain/weight/numbness and weakness Possible Resolutions to [employer] Focus: goals- supervison- might need H/H- hasn't been seen by PT; set d/c 11/26   Continued Need for Acute Rehabilitation Level of Care: The patient requires daily medical management by a physician with specialized training in physical medicine and rehabilitation for the following reasons: Direction of a multidisciplinary physical rehabilitation program to maximize functional independence : Yes Medical management of patient stability for increased activity during participation in an intensive rehabilitation regime.: Yes Analysis of  laboratory values and/or radiology reports with any subsequent need for medication adjustment and/or medical intervention. : Yes   I attest that I was present, lead the team conference, and concur with the assessment and plan of the team.   Cristi Loron 08/22/2021, 3:35 PM

## 2021-08-22 NOTE — Progress Notes (Signed)
Inpatient Rehabilitation  Patient information reviewed and entered into eRehab system by Jak Haggar M. Pascual Mantel, M.A., CCC/SLP, PPS Coordinator.  Information including medical coding, functional ability and quality indicators will be reviewed and updated through discharge.    

## 2021-08-22 NOTE — Plan of Care (Signed)
  Problem: RH Balance Goal: LTG Patient will maintain dynamic standing balance (PT) Description: LTG:  Patient will maintain dynamic standing balance with assistance during mobility activities (PT) Flowsheets (Taken 08/22/2021 1752) LTG: Pt will maintain dynamic standing balance during mobility activities with:: Supervision/Verbal cueing   Problem: Sit to Stand Goal: LTG:  Patient will perform sit to stand with assistance level (PT) Description: LTG:  Patient will perform sit to stand with assistance level (PT) Flowsheets (Taken 08/22/2021 1752) LTG: PT will perform sit to stand in preparation for functional mobility with assistance level: Supervision/Verbal cueing   Problem: RH Bed Mobility Goal: LTG Patient will perform bed mobility with assist (PT) Description: LTG: Patient will perform bed mobility with assistance, with/without cues (PT). Flowsheets (Taken 08/22/2021 1752) LTG: Pt will perform bed mobility with assistance level of: Independent with assistive device    Problem: RH Bed to Chair Transfers Goal: LTG Patient will perform bed/chair transfers w/assist (PT) Description: LTG: Patient will perform bed to chair transfers with assistance (PT). Flowsheets (Taken 08/22/2021 1752) LTG: Pt will perform Bed to Chair Transfers with assistance level: Supervision/Verbal cueing   Problem: RH Car Transfers Goal: LTG Patient will perform car transfers with assist (PT) Description: LTG: Patient will perform car transfers with assistance (PT). Flowsheets (Taken 08/22/2021 1752) LTG: Pt will perform car transfers with assist:: Supervision/Verbal cueing   Problem: RH Ambulation Goal: LTG Patient will ambulate in controlled environment (PT) Description: LTG: Patient will ambulate in a controlled environment, # of feet with assistance (PT). Flowsheets (Taken 08/22/2021 1752) LTG: Pt will ambulate in controlled environ  assist needed:: Supervision/Verbal cueing LTG: Ambulation distance in  controlled environment: 150 ft with LRAD Goal: LTG Patient will ambulate in home environment (PT) Description: LTG: Patient will ambulate in home environment, # of feet with assistance (PT). Flowsheets (Taken 08/22/2021 1752) LTG: Pt will ambulate in home environ  assist needed:: Supervision/Verbal cueing LTG: Ambulation distance in home environment: 75 ft with LRAD   Problem: RH Wheelchair Mobility Goal: LTG Patient will propel w/c in controlled environment (PT) Description: LTG: Patient will propel wheelchair in controlled environment, # of feet with assist (PT) Flowsheets (Taken 08/22/2021 1752) LTG: Pt will propel w/c in controlled environ  assist needed:: Independent with assistive device LTG: Propel w/c distance in controlled environment: 150 ft Goal: LTG Patient will propel w/c in home environment (PT) Description: LTG: Patient will propel wheelchair in home environment, # of feet with assistance (PT). Flowsheets (Taken 08/22/2021 1752) LTG: Pt will propel w/c in home environ  assist needed:: Independent with assistive device LTG: Propel w/c distance in home environment: 75 ft   Problem: RH Stairs Goal: LTG Patient will ambulate up and down stairs w/assist (PT) Description: LTG: Patient will ambulate up and down # of stairs with assistance (PT) Flowsheets (Taken 08/22/2021 1752) LTG: Pt will ambulate up/down stairs assist needed:: Minimal Assistance - Patient > 75% LTG: Pt will  ambulate up and down number of stairs: 12 stairs with R handrail

## 2021-08-22 NOTE — Progress Notes (Signed)
Patient ID: Keith [name], male   DOB: 10/28/1950, 70 y.o.   MRN: [id number]  SW met with pt and asked pt to call his wife Keith [name] while SW in room to provide updates from team conference, and d/c date 11/26. Preferred [facility] is [facility]. Fam edu Wed 11/23 9am-11amLoralee Pacas, MSW, [place] Office: [phone] Cell: [phone] Fax: [phone]

## 2021-08-22 NOTE — Discharge Instructions (Addendum)
Inpatient Rehab Discharge Instructions  Keith [name] Discharge date and time:  09/06/21  Activities/Precautions/ Functional Status: Activity: no lifting, driving, or strenuous exercise till cleared by MD Diet: regular diet Wound Care: keep wound clean and dry. Contact Dr. Venetia Constable if you develop any problems with your incision/wound--redness, swelling, increase in pain, drainage or if you develop fever or chills.     Functional status:  ___ No restrictions     ___ Walk up steps independently _X__ 24/7 supervision/assistance   ___ Walk up steps with assistance ___ Intermittent supervision/assistance  ___ Bathe/dress independently ___ Walk with walker     ___ Bathe/dress with assistance ___ Walk Independently    ___ Shower independently _X__ Walk with assistance    _X__ Shower with assistance _X__ No alcohol     ___ Return to work/school ________        COMMUNITY REFERRALS UPON DISCHARGE:    Home Health:   PT     OT                     Agency: [facility] PFXTK:[phone] *Please expect start of care to begin on 11/27. If you have not received follow-up, be sure to contact the branch directly.*     Special Instructions:    My questions have been answered and I understand these instructions. I will adhere to these goals and the provided educational materials after my discharge from the hospital.  Patient/Caregiver Signature _______________________________ Date __________  Clinician Signature _______________________________________ Date __________  Please bring this form and your medication list with you to all your follow-up doctor's appointments.

## 2021-08-22 NOTE — Plan of Care (Signed)
  Problem: RH Balance Goal: LTG Patient will maintain dynamic standing with ADLs (OT) Description: LTG:  Patient will maintain dynamic standing balance with assist during activities of daily living (OT)  Flowsheets (Taken 08/22/2021 1654) LTG: Pt will maintain dynamic standing balance during ADLs with: Supervision/Verbal cueing   Problem: Sit to Stand Goal: LTG:  Patient will perform sit to stand in prep for activites of daily living with assistance level (OT) Description: LTG:  Patient will perform sit to stand in prep for activites of daily living with assistance level (OT) Flowsheets (Taken 08/22/2021 1654) LTG: PT will perform sit to stand in prep for activites of daily living with assistance level: Supervision/Verbal cueing   Problem: RH Grooming Goal: LTG Patient will perform grooming w/assist,cues/equip (OT) Description: LTG: Patient will perform grooming with assist, with/without cues using equipment (OT) Flowsheets (Taken 08/22/2021 1654) LTG: Pt will perform grooming with assistance level of: Independent with assistive device    Problem: RH Bathing Goal: LTG Patient will bathe all body parts with assist levels (OT) Description: LTG: Patient will bathe all body parts with assist levels (OT) Flowsheets (Taken 08/22/2021 1654) LTG: Pt will perform bathing with assistance level/cueing: Supervision/Verbal cueing   Problem: RH Dressing Goal: LTG Patient will perform lower body dressing w/assist (OT) Description: LTG: Patient will perform lower body dressing with assist, with/without cues in positioning using equipment (OT) Flowsheets (Taken 08/22/2021 1654) LTG: Pt will perform lower body dressing with assistance level of: Supervision/Verbal cueing   Problem: RH Toileting Goal: LTG Patient will perform toileting task (3/3 steps) with assistance level (OT) Description: LTG: Patient will perform toileting task (3/3 steps) with assistance level (OT)  Flowsheets (Taken 08/22/2021  1654) LTG: Pt will perform toileting task (3/3 steps) with assistance level: Supervision/Verbal cueing   Problem: RH Toilet Transfers Goal: LTG Patient will perform toilet transfers w/assist (OT) Description: LTG: Patient will perform toilet transfers with assist, with/without cues using equipment (OT) Flowsheets (Taken 08/22/2021 1654) LTG: Pt will perform toilet transfers with assistance level of: Supervision/Verbal cueing   Problem: RH Tub/Shower Transfers Goal: LTG Patient will perform tub/shower transfers w/assist (OT) Description: LTG: Patient will perform tub/shower transfers with assist, with/without cues using equipment (OT) Flowsheets (Taken 08/22/2021 1654) LTG: Pt will perform tub/shower stall transfers with assistance level of: Supervision/Verbal cueing

## 2021-08-22 NOTE — Progress Notes (Signed)
Inpatient Rehabilitation Admission Medication Review by a Pharmacist  A complete drug regimen review was completed for this patient to identify any potential clinically significant medication issues.  High Risk Drug Classes Is patient taking? Indication by Medication  Antipsychotic Yes Compazine for nausea  Anticoagulant Yes LMWH for VTE prophx.  Antibiotic No   Opioid Yes Percocet for pain s/p spinal surgery  Antiplatelet No   Hypoglycemics/insulin Yes Metformin for DM  Vasoactive Medication Yes Atenolol, chlorthalidone, Clonidine for HTN  Chemotherapy No   Other No      Type of Medication Issue Identified Description of Issue Recommendation(s)  Drug Interaction(s) (clinically significant)     Duplicate Therapy     Allergy     No Medication Administration End Date     Incorrect Dose     Additional Drug Therapy Needed     Significant med changes from prior encounter (inform family/care partners about these prior to discharge). None   Other       Clinically significant medication issues were identified that warrant physician communication and completion of prescribed/recommended actions by midnight of the next day:  No  Time spent performing this drug regimen review (minutes):  23min  Taija Mathias S. Alford Highland, PharmD, BCPS Clinical Staff Pharmacist [email or web address] [place] 08/22/2021 8:48 AM

## 2021-08-23 MED ORDER — LIDOCAINE 5 % EX PTCH
2.0000 | MEDICATED_PATCH | CUTANEOUS | Status: DC
  Administered 2021-08-23 – 2021-09-05 (×14): 2 via TRANSDERMAL
  Filled 2021-08-23 (×14): qty 2

## 2021-08-23 MED ORDER — CYCLOBENZAPRINE HCL 10 MG PO TABS
10.0000 mg | ORAL_TABLET | Freq: Every day | ORAL | Status: DC
  Administered 2021-08-23 – 2021-08-27 (×5): 10 mg via ORAL
  Filled 2021-08-23 (×5): qty 1

## 2021-08-23 NOTE — Progress Notes (Signed)
PROGRESS NOTE   Subjective/Complaints:  Pt reports did fairly well last night- Skelaxin didn't help at all- but still having back pain- admits sat up in bedside chair for hours yesterday and made back pain worse.  Wants to try something else for back pain -skelaxin wasn't helpful at all.   ROS:  Pt denies SOB, abd pain, CP, N/V/C/D, and vision changes      Objective:   No results found. Recent Labs    08/22/21 0436  WBC 5.6  HGB 10.3*  HCT 32.2*  PLT 296   Recent Labs    08/21/21 1639 08/22/21 0436  NA  --  140  K  --  4.2  CL  --  108  CO2  --  23  GLUCOSE  --  115*  BUN  --  25*  CREATININE 1.60* 1.52*  CALCIUM  --  9.4    Intake/Output Summary (Last 24 hours) at 08/23/2021 0826 Last data filed at 08/23/2021 0400 Gross per 24 hour  Intake 220 ml  Output 1130 ml  Net -910 ml        Physical Exam: Vital Signs Blood pressure 106/69, pulse 73, temperature 97.7 F (36.5 C), temperature source Oral, resp. rate 18, height 6' (1.829 m), weight 125.8 kg, SpO2 94 %.    General: awake, alert, appropriate, sitting up in bed; NAD HENT: conjugate gaze; oropharynx moist CV: regular rate; no JVD Pulmonary: CTA B/L; no W/R/R- good air movement GI: soft, NT, ND, (+)BS- normoactive Psychiatric: appropriate- interactive Neurological: [name] MS: muscle spasm in Left low back/mid back- no change to palpation Skin: No evidence of breakdown, no evidence of rash- incision on back C/D/I-  Neurologic: Cranial nerves II through XII intact, motor strength is 5/5 in bilateral deltoid, bicep, tricep, grip, 2-hip flexor,3- knee extensors, 2- ankle dorsiflexor and plantar flexor Sensory exam normal sensation to light touch and reduced Right toe proprioception in bilateral upper and lower extremities   Assessment/Plan: 1. Functional deficits which require 3+ hours per day of interdisciplinary therapy in a comprehensive  inpatient rehab setting. Physiatrist is providing close team supervision and 24 hour management of active medical problems listed below. Physiatrist and rehab team continue to assess barriers to discharge/monitor patient progress toward functional and medical goals  Care Tool:  Bathing    Body parts bathed by patient: Right arm, Left arm, Abdomen, Chest, Front perineal area, Buttocks, Right upper leg, Left upper leg, Face   Body parts bathed by helper: Right lower leg, Left lower leg     Bathing assist Assist Level: Minimal Assistance - Patient > 75%     Upper Body Dressing/Undressing Upper body dressing   What is the patient wearing?: Pull over shirt    Upper body assist Assist Level: Set up assist    Lower Body Dressing/Undressing Lower body dressing      What is the patient wearing?: Pants (shorts)     Lower body assist Assist for lower body dressing: Independent with assitive device Assistive Device Comment: walker   Toileting Toileting    Toileting assist Assist for toileting: Moderate Assistance - Patient 50 - 74% Assistive Device Comment: walker   Transfers Chair/bed  transfer  Transfers assist     Chair/bed transfer assist level: Minimal Assistance - Patient > 75%     Locomotion Ambulation   Ambulation assist      Assist level: Minimal Assistance - Patient > 75% Assistive device: Walker-rolling Max distance: 48'   Walk 10 feet activity   Assist     Assist level: Minimal Assistance - Patient > 75% Assistive device: Walker-rolling   Walk 50 feet activity   Assist Walk 50 feet with 2 turns activity did not occur: Safety/medical concerns         Walk 150 feet activity   Assist Walk 150 feet activity did not occur: Safety/medical concerns         Walk 10 feet on uneven surface  activity   Assist Walk 10 feet on uneven surfaces activity did not occur: Safety/medical concerns         Wheelchair     Assist Is the  patient using a wheelchair?:  (TBD) Type of Wheelchair: Manual    Wheelchair assist level: Supervision/Verbal cueing Max wheelchair distance: 34'    Wheelchair 50 feet with 2 turns activity    Assist        Assist Level: Supervision/Verbal cueing   Wheelchair 150 feet activity     Assist      Assist Level: Moderate Assistance - Patient 50 - 74%   Blood pressure 106/69, pulse 73, temperature 97.7 F (36.5 C), temperature source Oral, resp. rate 18, height 6' (1.829 m), weight 125.8 kg, SpO2 94 %.    Medical Problem List and Plan: 1.  Decline in self care and mobility  secondary to THoracic myelopathy             -patient may shower             -ELOS/Goals: 14-16d Sup/Min A  D/c date set as 11/26  11/16- con't PT and OT/CIR- pain biggest limiter 2.  Antithrombotics: -DVT/anticoagulation:  Pharmaceutical: Lovenox             -antiplatelet therapy: N/A 3. Chronic Pain Management:  Sees Dr. Glenford Bayley on oxycodone 5 mg qid, gabapentin and Cymbalta.   --Continue Oxycodone prn 11/16- skelaxin didn't work- will d/c- will try Lidoderm patches 8pm to 8am and flexeril nightly 10 mg- if doesn't help, will do trigger point injections tomorrow  4. Mood: LCSW to follow for evaluation and support.              -antipsychotic agents: N/A 5. Neuropsych: This patient is capable of making decisions on his own behalf. 6. Skin/Wound Care: Monitor wound for healing.  7. Fluids/Electrolytes/Nutrition: Monitor I/O.  8. T2DM: Hgb A1c-6.2 and BS controlled on low dose metformin --Monitor BS ac/hs and use SSI as needed.  11/16- will add CBGs 2x/day before meals and monitor -- Continue metformin daily  9. HTN; BP TID--on atenolol/hygroton and low dose catapres.   11/15- BP high prior to BP meds- but 103/70 after meds- con't regimen- but if feels dizzy, will decrease Bpmeds  11/16- BP soft this AM- 778E systolic- will d/w therapy if orthostatic 11. Neuropathy: continue Gabapentin  TID. 12. Dyslipidemia: ON Zocor 13. AKI and ABLA-  Will recheck labs in am.    11/15- Hb stable at 10.3; Cr slightly better at 1.52- but still elevated- will push fluids and recheck Thursday- if still elevated, use IVFs.   11/16- asked pt to push fluids- labs again tomorrow    LOS: 2 days A FACE TO FACE EVALUATION  WAS PERFORMED  Nadea Kirkland 08/23/2021, 8:26 AM

## 2021-08-23 NOTE — Progress Notes (Signed)
Physical Therapy Session Note  Patient Details  Name: Keith [name] MRN: [id number] Date of Birth: 1951/06/25  Today's Date: 08/23/2021 PT Individual Time: 1405-1510 PT Individual Time Calculation (min): 65 min   Short Term Goals: Week 1:  PT Short Term Goal 1 (Week 1): Pt will complete least restrictive transfers with min A consistently PT Short Term Goal 2 (Week 1): Pt will perform bed mobility at Supervision level consistently while adhering to spinal precautions PT Short Term Goal 3 (Week 1): Pt will ambulate x 100 ft with LRAD and CGA PT Short Term Goal 4 (Week 1): Pt will initiate stair training  Skilled Therapeutic Interventions/Progress Updates: Pt presented in recliner agreeable to therapy. Pt states pain 4/10 currently, premedicated and ice pack at trigger point site. Rest breaks provided as needed due to pain which became intermittently more frequent as session proceeded. Performed Sit to stand from recliner with minA and pt ambulated ~35f with RW and CGA. Per pt increasing pain caused pt to stop walking. Pt indicated will trial lidocaine patch tonight and anticipate receiving trigger point injections tomorrow. Pt transported remaining distance to 4th floor day room and participated in x 2 bouts of corn hole for standing tolerance and dynamic reaching. Pt required intermittent cues for improved posture and required x 1 seated rest break due to pain. Pt was able to perform reaching tasks with moderate challenges without LOB, but noted some increased bilateral knee flexion with fatigue. Once completed pt ambulated across room to high/low mat ~310fin same manner as prior but with improved Sit to stand mechanics performing with CGA. At high/low mat pt performed Sit to stand x 5 from elevated mat for BLE strengthening and improved technique for powering up vs pushing with BUE on RW. Pt noted to perform well with first 3 then more reliance on BUE on last 2. Pt then performed ambulatory  transfer to w/c with CGA overall and transported back to room. Pt performed ambulatory transfer in same manner back to recliner and PTA refreshed ice pack per pt request. Pt left in recliner at end of session with call bell within reach and current needs met.      Therapy Documentation Precautions:  Precautions Precautions: Back Precaution Comments: 3/3 recalled Required Braces or Orthoses: Other Brace (L ankle) Spinal Brace Comments: No brace per MD note Other Brace: Utilized lace up ankle brace today that pt brought from home Restrictions Weight Bearing Restrictions: No General:   Vital Signs: Therapy Vitals Temp: 98 F (36.7 C) Pulse Rate: 72 Resp: 16 BP: 105/72 Patient Position (if appropriate): Sitting Oxygen Therapy SpO2: 99 % O2 Device: Room Air   Therapy/Group: Individual Therapy  Dyllan Hughett Cardell Rachel, PTA  08/23/2021, 4:02 PM

## 2021-08-23 NOTE — Progress Notes (Signed)
Patient ID: Keith [name], male   DOB: 10-Jun-1951, 70 y.o.   MRN: [id number]  SW sent HHPT/OT referral to [facility] and waiting on follow-up.  *referral accepted [facility] and [facility] 11/27.  Loralee Pacas, MSW, [place] Office: [phone] Cell: [phone] Fax: [phone]

## 2021-08-23 NOTE — Progress Notes (Signed)
Physical Therapy Session Note  Patient Details  Name: Keith [name] MRN: [id number] Date of Birth: July 13, 1951  Today's Date: 08/23/2021 PT Individual Time: 1000-1100 PT Individual Time Calculation (min): 60 min   Short Term Goals: Week 1:  PT Short Term Goal 1 (Week 1): Pt will complete least restrictive transfers with min A consistently PT Short Term Goal 2 (Week 1): Pt will perform bed mobility at Supervision level consistently while adhering to spinal precautions PT Short Term Goal 3 (Week 1): Pt will ambulate x 100 ft with LRAD and CGA PT Short Term Goal 4 (Week 1): Pt will initiate stair training  Skilled Therapeutic Interventions/Progress Updates:    Pt received seated in recliner in room, agreeable to PT session. Pt reports 3/10 pain in L lower back that increases with mobility during session. Pt premedicated for pain, provided ice pack at end of session for pain management. Sit to stand with min A to RW during session. Pt tends to push up with BUE from arms of chair and then has to shift weight forwards anteriorly once in standing. Attempted to have pt place one UE on RW and one on arms of chair to stand but pt requires increased assist to stand via this method as well as has increased pain. Pt also exhibits poor eccentric control when sitting. Ambulation x 58 ft, x 46 ft with RW and CGA for balance. Pt's gait distance limited by onset of sharp pain in L lower back at site of trigger points. Standing RLE 3" step-ups with BUE support and min A for balance x 2 reps, x 3 reps, and x 4 reps before onset of pain requiring seated rest break between each round. Pt continues to exhibit RLE weakness and exhibits some circumduction when stepping up with RLE onto step. Pt reports pain increase with any strenuous activity. Manual w/c propulsion x 100 ft with use of BUE at Supervision level. Pt left seated in recliner in room with needs in reach, ice pack to L lower back at end of session.  Therapy  Documentation Precautions:  Precautions Precautions: Back Precaution Comments: 3/3 recalled Required Braces or Orthoses: Other Brace (L ankle) Spinal Brace Comments: No brace per MD note Other Brace: Utilized lace up ankle brace today that pt brought from home Restrictions Weight Bearing Restrictions: No    Therapy/Group: Individual Therapy   Excell Seltzer, PT, DPT, CSRS  08/23/2021, 1:58 PM

## 2021-08-23 NOTE — Progress Notes (Signed)
Inpatient Rehabilitation Care Coordinator Assessment and Plan Patient Details  Name: Keith [name] MRN: [id number] Date of Birth: [birth year]  Today's Date: 08/23/2021  Hospital Problems: Principal Problem:   Thoracic myelopathy  Past Medical History:  Past Medical History:  Diagnosis Date   Allergy    Chronic back pain    Diabetes mellitus without complication ([place])    DNR (do not resuscitate) 11/19/2020   Duodenal ulcer hemorrhage 08/29/2014   ED (erectile dysfunction)    Esophageal stricture 08/30/2014   Glucose intolerance (impaired glucose tolerance)    Hiatal hernia 08/30/2014   Hypertension    Hypokalemia 04/11/2013   MRSA carrier 08/30/2014   Obesity    Osteoarthritis    Pneumonia    Pulmonary embolism ([place]) 06/2020   Scoliosis 2016   Spinal stenosis of lumbar region    Urinary tract infection 04/11/2013   Past Surgical History:  Past Surgical History:  Procedure Laterality Date   COLONOSCOPY  2008   ESOPHAGOGASTRODUODENOSCOPY N/A 08/29/2014   Procedure: ESOPHAGOGASTRODUODENOSCOPY (EGD);  Surgeon: Lafayette Dragon, MD;  Location: [facility];  Service: Endoscopy;  Laterality: N/A;   LAMINECTOMY N/A 07/28/2018   Procedure: THORACIC ELEVEN- THORACIC TWELVE POSTERIOR DECOMPRESSION LAMINECTOMY;  Surgeon: Judith Part, MD;  Location: [facility];  Service: Neurosurgery;  Laterality: N/A;   LUMBAR LAMINECTOMY     NASAL POLYP SURGERY     ORIF ANKLE FRACTURE Right 07/01/2020   Procedure: OPEN REDUCTION INTERNAL FIXATION (ORIF) ANKLE FRACTURE. TRIMALLEOLAR;  Surgeon: Marchia Bond, MD;  Location: [place] [facility];  Service: Orthopedics;  Laterality: Right;   TONSILLECTOMY     Social History:  reports that he quit smoking about 27 years ago. He has a 25.00 pack-year smoking history. He has never used smokeless tobacco. He reports current alcohol use. He reports that he does not use drugs.  Family / Support Systems Marital Status: Married How Long?: 24 years Patient Roles:  Spouse, Parent Spouse/Significant Other: Margreta Journey (wife): [phone] Children: blended family- 5 children Other Supports: family/friends Anticipated Caregiver: Wife primary caregiver Ability/Limitations of Caregiver: None reported Caregiver Availability: 24/7 Family Dynamics: Pt lives with his wife.  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 Education: college Lamy - How often do you need to have someone help you when you read instructions, pamphlets, or other written material from your doctor or pharmacy?: Never Writes: Yes Employment Status: Retired Date Retired/Disabled/Unemployed: 07/2018 Age Retired: 26 Legal History/Current Legal Issues: Denies Guardian/Conservator: N/A   Abuse/Neglect Abuse/Neglect Assessment Can Be Completed: Yes Physical Abuse: Denies Verbal Abuse: Denies Sexual Abuse: Denies Exploitation of patient/patient's resources: Denies Self-Neglect: Denies  Patient response to: Social Isolation - How often do you feel lonely or isolated from those around you?: Never  Emotional Status Pt's affect, behavior and adjustment status: Pt in good spirits at time of visit Recent Psychosocial Issues: Denies Psychiatric History: Denies Substance Abuse History: etoh-occassional  Patient / Family Perceptions, Expectations & Goals Pt/Family understanding of illness & functional limitations: Pt and family have a general understanding of pt care needs Premorbid pt/family roles/activities: Independent Anticipated changes in roles/activities/participation: Assistance with ADLs/IADLs Pt/family expectations/goals: Pt foal is to work on "mobility to do nomrla thing for myself such as things around the house, ushering at church again; take a load off my wife." Wifes goal-  "get better and healthiers, be more independent, and active as before so we can travel like  we used too."  [employer]  Resources [employer] Scripts: None Premorbid Home Care/DME Agencies: None Transportation available at discharge: Wife Is the patient able to respond to transportation needs?: Yes In the past 12 months, has lack of transportation kept you from medical appointments or from getting medications?: No In the past 12 months, has lack of transportation kept you from meetings, work, or from getting things needed for daily living?: No  Discharge Planning Living Arrangements: Spouse/significant other Support Systems: Spouse/significant other, Other relatives, Friends/neighbors Type of Residence: Private residence Insurance Resources: Multimedia programmer (specify) ([employer]) Financial Resources: [place] Referred: No Living Expenses: Own Money Management: Patient, Spouse Does the patient have any problems obtaining your medications?: No Home Management: Pt and spouse manage homecare needs Patient/Family Preliminary Plans: TBD Care Coordinator Anticipated Follow Up Needs: HH/OP  Clinical Impression SW met with pt in room to introduce self, explain role and discuss discharge process. Pt HCPOA is his wife , and then their dtr Keith [name], and 3) dtr Keith [name]. 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 Currently working with a VA disability rep to get VA disability. DME: RW, w/c, 3in1 BSC- uses also as shower chair, and rollator.   Shaneya Taketa A Burdette Gergely 08/23/2021, 4:09 PM

## 2021-08-23 NOTE — Progress Notes (Signed)
Occupational Therapy Session Note  Patient Details  Name: Keith [name] MRN: [id number] Date of Birth: [birth year]  Today's Date: 08/23/2021 OT Individual Time: 9643-8381 OT Individual Time Calculation (min): 70 min    Short Term Goals: Week 1:  OT Short Term Goal 1 (Week 1): Pt will perform BSC/toilet transfers with CGA and LRAD OT Short Term Goal 2 (Week 1): Pt will don LB clothing with AE PRN with CGA OT Short Term Goal 3 (Week 1): Pt will perform shower level bathing with CGA  Skilled Therapeutic Interventions/Progress Updates:    Pt resting in bed upon arrival and ready to take a shower. Supine>sit EOB with supervision using bed rails. Pt amb with RW to bathroom and performed TTB transfer with CGA. Bathing with min A seated on TTB. Pt returned to room and sat in w/c to complete grooming at sink. Dressing with sit<>stand from recliner. Pt required min A for sit<>stand from recliner to pull pants over hips. Pt required assistance to don socks. Sit<>stand X 4 with min A from lower surfaces. Pt reports significant pain in Lt low back (trigger point) especially with stand>sit. Pt remained seated in recliner with all needs within reach. Seat alarm acativated.   Therapy Documentation Precautions:  Precautions Precautions: Back Precaution Comments: 3/3 recalled Required Braces or Orthoses: Other Brace (L ankle) Spinal Brace Comments: No brace per MD note Other Brace: Utilized lace up ankle brace today that pt brought from home Restrictions Weight Bearing Restrictions: No Pain: Pt reports Lt low back pain 3/10 at rest increasing to 7/10 with sit<>stand; warm shower and repostioned   Therapy/Group: Individual Therapy  Leroy Libman 08/23/2021, 9:35 AM

## 2021-08-24 LAB — BASIC METABOLIC PANEL
Anion gap: 7 (ref 5–15)
BUN: 27 mg/dL — ABNORMAL HIGH (ref 8–23)
CO2: 23 mmol/L (ref 22–32)
Calcium: 9.3 mg/dL (ref 8.9–10.3)
Chloride: 109 mmol/L (ref 98–111)
Creatinine, Ser: 1.67 mg/dL — ABNORMAL HIGH (ref 0.61–1.24)
GFR, Estimated: 44 mL/min — ABNORMAL LOW (ref >60)
Glucose, Bld: 116 mg/dL — ABNORMAL HIGH (ref 70–99)
Potassium: 4.3 mmol/L (ref 3.5–5.1)
Sodium: 139 mmol/L (ref 135–145)

## 2021-08-24 LAB — GLUCOSE, CAPILLARY
Glucose-Capillary: 103 mg/dL — ABNORMAL HIGH (ref 70–99)
Glucose-Capillary: 132 mg/dL — ABNORMAL HIGH (ref 70–99)

## 2021-08-24 MED ORDER — LIDOCAINE HCL 1 % IJ SOLN
10.0000 mL | Freq: Once | INTRAMUSCULAR | Status: AC
  Administered 2021-08-24: 11:00:00 10 mL
  Filled 2021-08-24 (×2): qty 10

## 2021-08-24 MED ORDER — SODIUM CHLORIDE 0.9 % IV SOLN: INTRAVENOUS | Status: AC

## 2021-08-24 NOTE — Progress Notes (Addendum)
PROGRESS NOTE   Subjective/Complaints:  Pt no help from patches and no help from flexeril.  Poor sleep- due to pain from L low back.     ROS:  Pt denies SOB, abd pain, CP, N/V/C/D, and vision changes      Objective:   No results found. Recent Labs    08/22/21 0436  WBC 5.6  HGB 10.3*  HCT 32.2*  PLT 296   Recent Labs    08/22/21 0436 08/24/21 0514  NA 140 139  K 4.2 4.3  CL 108 109  CO2 23 23  GLUCOSE 115* 116*  BUN 25* 27*  CREATININE 1.52* 1.67*  CALCIUM 9.4 9.3    Intake/Output Summary (Last 24 hours) at 08/24/2021 0915 Last data filed at 08/24/2021 0700 Gross per 24 hour  Intake 720 ml  Output 1050 ml  Net -330 ml        Physical Exam: Vital Signs Blood pressure 107/70, pulse 87, temperature 98.1 F (36.7 C), temperature source Oral, resp. rate 16, height 6' (1.829 m), weight 125.8 kg, SpO2 94 %.     General: awake, alert, appropriate,  sitting up in bed; sleepy; NAD HENT: conjugate gaze; oropharynx moist CV: regular rate; no JVD Pulmonary: CTA B/L; no W/R/R- good air movement GI: soft, NT, ND, (+)BS Psychiatric: appropriate; sleepy due to pain Neurological: [name]  MS: muscle spasm in Left low back/mid back- no change Skin: No evidence of breakdown, no evidence of rash- incision on back C/D/I-  Neurologic: Cranial nerves II through XII intact, motor strength is 5/5 in bilateral deltoid, bicep, tricep, grip, 2-hip flexor,3- knee extensors, 2- ankle dorsiflexor and plantar flexor Sensory exam normal sensation to light touch and reduced Right toe proprioception in bilateral upper and lower extremities   Assessment/Plan: 1. Functional deficits which require 3+ hours per day of interdisciplinary therapy in a comprehensive inpatient rehab setting. Physiatrist is providing close team supervision and 24 hour management of active medical problems listed below. Physiatrist and rehab team  continue to assess barriers to discharge/monitor patient progress toward functional and medical goals  Care Tool:  Bathing    Body parts bathed by patient: Right arm, Left arm, Chest, Abdomen, Front perineal area, Buttocks, Right upper leg, Left upper leg, Right lower leg, Face   Body parts bathed by helper: Right lower leg, Left lower leg     Bathing assist Assist Level: Minimal Assistance - Patient > 75%     Upper Body Dressing/Undressing Upper body dressing   What is the patient wearing?: Pull over shirt    Upper body assist Assist Level: Set up assist    Lower Body Dressing/Undressing Lower body dressing      What is the patient wearing?: Underwear/pull up, Pants     Lower body assist Assist for lower body dressing: Minimal Assistance - Patient > 75% Assistive Device Comment: walker   Toileting Toileting    Toileting assist Assist for toileting: Moderate Assistance - Patient 50 - 74% Assistive Device Comment: walker   Transfers Chair/bed transfer  Transfers assist     Chair/bed transfer assist level: Minimal Assistance - Patient > 75%     Locomotion Ambulation   Ambulation  assist      Assist level: Contact Guard/Touching assist Assistive device: Walker-rolling Max distance: 32'   Walk 10 feet activity   Assist     Assist level: Contact Guard/Touching assist Assistive device: Walker-rolling   Walk 50 feet activity   Assist Walk 50 feet with 2 turns activity did not occur: Safety/medical concerns  Assist level: Contact Guard/Touching assist Assistive device: Walker-rolling    Walk 150 feet activity   Assist Walk 150 feet activity did not occur: Safety/medical concerns         Walk 10 feet on uneven surface  activity   Assist Walk 10 feet on uneven surfaces activity did not occur: Safety/medical concerns         Wheelchair     Assist Is the patient using a wheelchair?:  (TBD) Type of Wheelchair: Manual     Wheelchair assist level: Supervision/Verbal cueing Max wheelchair distance: 100'    Wheelchair 50 feet with 2 turns activity    Assist        Assist Level: Supervision/Verbal cueing   Wheelchair 150 feet activity     Assist      Assist Level: Moderate Assistance - Patient 50 - 74%   Blood pressure 107/70, pulse 87, temperature 98.1 F (36.7 C), temperature source Oral, resp. rate 16, height 6' (1.829 m), weight 125.8 kg, SpO2 94 %.    Medical Problem List and Plan: 1.  Decline in self care and mobility  secondary to THoracic myelopathy             -patient may shower             -ELOS/Goals: 14-16d Sup/Min A  D/c date set as 11/26  11/17- pain biggest limiter- con't PT and OT 2.  Antithrombotics: -DVT/anticoagulation:  Pharmaceutical: Lovenox             -antiplatelet therapy: N/A 3. Chronic Pain Management:  Sees Dr. Glenford Bayley on oxycodone 5 mg qid, gabapentin and Cymbalta.   --Continue Oxycodone prn 11/16- skelaxin didn't work- will d/c- will try Lidoderm patches 8pm to 8am and flexeril nightly 10 mg- if doesn't help, will do trigger point injections tomorrow   11/17- will do trigger point injections today- as per below-  4. Mood: LCSW to follow for evaluation and support.              -antipsychotic agents: N/A 5. Neuropsych: This patient is capable of making decisions on his own behalf. 6. Skin/Wound Care: Monitor wound for healing.  7. Fluids/Electrolytes/Nutrition: Monitor I/O.  8. T2DM: Hgb A1c-6.2 and BS controlled on low dose metformin --Monitor BS ac/hs and use SSI as needed.  11/16- will add CBGs 2x/day before meals and monitor 11/17- D/c metformin- due to Cr rising.  9. HTN; BP TID--on atenolol/hygroton and low dose catapres.   11/15- BP high prior to BP meds- but 103/70 after meds- con't regimen- but if feels dizzy, will decrease Bpmeds  11/16- BP soft this AM- 094M systolic- will d/w therapy if orthostatic 11. Neuropathy: continue Gabapentin  TID. 12. Dyslipidemia: ON Zocor 13. AKI and ABLA-  Will recheck labs in am.    11/15- Hb stable at 10.3; Cr slightly better at 1.52- but still elevated- will push fluids and recheck Thursday- if still elevated, use IVFs.   11/16- asked pt to push fluids- labs again tomorrow  11/17- Cr up to 1.67- will start IVFs 75cc/hour x 1 day and recheck BMP in AM   Patient here for trigger point  injections for  Consent done and on chart.  Cleaned areas with alcohol and injected using a 27 gauge 1.5 inch needle  Injected  Using 1% Lidocaine with no EPI  Upper traps Levators Posterior scalenes Middle scalenes Splenius Capitus Pectoralis Major Rhomboids Infraspinatus Teres Major/minor Thoracic paraspinals on L x4 Lumbar paraspinals Other injections-     Current level of pain after injections is decreased from the 7-8/10 he had- feels like it's loosening up.  NO bleeding and complications   I spent a total of 43 minutes on total care- >50% coordination of care- doing trigger point injections and seeing pt 2x.   LOS: 3 days A FACE TO FACE EVALUATION WAS PERFORMED  Keith [name] 08/24/2021, 9:15 AM

## 2021-08-24 NOTE — Progress Notes (Signed)
Occupational Therapy Session Note  Patient Details  Name: Keith [name] MRN: [id number] Date of Birth: [birth year]  Today's Date: 08/24/2021 OT Individual Time: 5537-4827 OT Individual Time Calculation (min): 70 min    Short Term Goals: Week 1:  OT Short Term Goal 1 (Week 1): Pt will perform BSC/toilet transfers with CGA and LRAD OT Short Term Goal 2 (Week 1): Pt will don LB clothing with AE PRN with CGA OT Short Term Goal 3 (Week 1): Pt will perform shower level bathing with CGA  Skilled Therapeutic Interventions/Progress Updates:    Pt resting in bed upon arrival with RN present admin meds. Supine>sit EOB with supervision with HOB elevated. Pt states he has Tempur-Pedic bed with ability to elevated HOB. Sit<>stand and stand pivot transfer to w/c with CGA. Pt reports that Lt low back pain intensifies with sit<>stand. Pt propelled w/c to sink to completed bathing/dressing with sit<>stand at sink. CGA for bathing. Pt used reacher to thread BLE into pants. Min A for pulling pants over hips while standing. Back pain continued to intensify wit sit<>stands. Discussed morning routine at home. Pt has w/c at home which he was using to move around in for bathing/dressing. Pt stated he usually got dressed from EOB or in chair beside bed. Pt transferred to recliner and remained seated with all needs within reach.   Therapy Documentation Precautions:  Precautions Precautions: Back Precaution Comments: 3/3 recalled Required Braces or Orthoses: Other Brace (L ankle) Spinal Brace Comments: No brace per MD note Other Brace: Utilized lace up ankle brace today that pt brought from home Restrictions Weight Bearing Restrictions: No Pain:  Pt reports Lt low back pain rated 5/10 to 7/10 with activity; repositioned, meds admin at beginning of sessioin   Therapy/Group: Individual Therapy  Leroy Libman 08/24/2021, 9:33 AM

## 2021-08-24 NOTE — Evaluation (Signed)
Recreational Therapy Assessment and Plan  Patient Details  Name: Keith [name] MRN: [id number] Date of Birth: 06/10/1951 Today's Date: 08/24/2021  Rehab Potential:  Good ELOS:   d/c 11/26  Assessment   Hospital Problem: Principal Problem:   Thoracic myelopathy     Past Medical History:      Past Medical History:  Diagnosis Date   Allergy     Chronic back pain     Diabetes mellitus without complication ([place])     DNR (do not resuscitate) 11/19/2020   Duodenal ulcer hemorrhage 08/29/2014   ED (erectile dysfunction)     Esophageal stricture 08/30/2014   Glucose intolerance (impaired glucose tolerance)     Hiatal hernia 08/30/2014   Hypertension     Hypokalemia 04/11/2013   MRSA carrier 08/30/2014   Obesity     Osteoarthritis     Pneumonia     Pulmonary embolism ([place]) 06/2020   Scoliosis 2016   Spinal stenosis of lumbar region     Urinary tract infection 04/11/2013    Past Surgical History:       Past Surgical History:  Procedure Laterality Date   COLONOSCOPY   2008   ESOPHAGOGASTRODUODENOSCOPY N/A 08/29/2014    Procedure: ESOPHAGOGASTRODUODENOSCOPY (EGD);  Surgeon: Lafayette Dragon, MD;  Location: [facility];  Service: Endoscopy;  Laterality: N/A;   LAMINECTOMY N/A 07/28/2018    Procedure: THORACIC ELEVEN- THORACIC TWELVE POSTERIOR DECOMPRESSION LAMINECTOMY;  Surgeon: Judith Part, MD;  Location: [facility];  Service: Neurosurgery;  Laterality: N/A;   LUMBAR LAMINECTOMY       NASAL POLYP SURGERY       ORIF ANKLE FRACTURE Right 07/01/2020    Procedure: OPEN REDUCTION INTERNAL FIXATION (ORIF) ANKLE FRACTURE. TRIMALLEOLAR;  Surgeon: Marchia Bond, MD;  Location: [place];  Service: Orthopedics;  Laterality: Right;   TONSILLECTOMY          Assessment & Plan Clinical Impression:  Keith [name]. [name] is a 70 year old male with history of HTN, T2DM, dyslipidemia who was admitted on 08/07/21 with 6 month history of progressive back pain with numbness BLE who reported  having had  ESI a few weeks PTA with resolution of back pain but then developed progressive weakness. MRI brain done revealing chronic thoracic spine ankylosis T7-T11 with severe degeneration of T11-T12 moderate to severe spinal stenosis and subsequent cord compression T11-T12 with progression of focal myelomalacia.  He was taken to the OR for T11-T12 laminectomy with right T11-T12 facet and posterior spinal fusion by Dr. Venetia Constable on 08/09/21.  Postop reporting improvement in BLE strength but continues to be limited by balance deficits with weakness, sensory deficits as well as decreased endurance.  CIR recommended due to functional decline. Patient transferred to CIR on 08/21/2021 .  Pt presents with decreased activity tolerance, decreased functional mobility, decreased balance, feelings of stress Limiting pt's independence with leisure/community pursuits.  Pt referred by team for stress management group.  Pt actively participated in stress managment/coping group today.  Pt education/discussion focused on stress exploration including factors that contribute to stress, factors that protect against stress and potential coping strategies.  Coping strategies included deep breathing, progressive muscle relaxation, imagery & challenging irrational thoughts.  Pt appreciative of this group.  Handouts provided.   Plan  No further TR at this time.  Recommendations for other services: None   Discharge Criteria: Patient will be discharged from TR if patient refuses treatment 3 consecutive times without medical reason.  If treatment goals not met, if there is  a change in medical status, if patient makes no progress towards goals or if patient is discharged from hospital.  The above assessment, treatment plan, treatment alternatives and goals were discussed and mutually agreed upon: by patient  [place] 08/24/2021, 4:36 PM

## 2021-08-24 NOTE — Progress Notes (Signed)
Physical Therapy Session Note  Patient Details  Name: Keith [name] MRN: [id number] Date of Birth: 07/02/1951  Today's Date: 08/24/2021 PT Individual Time: 1100-1210 PT Individual Time Calculation (min): 70 min   Short Term Goals: Week 1:  PT Short Term Goal 1 (Week 1): Pt will complete least restrictive transfers with min A consistently PT Short Term Goal 2 (Week 1): Pt will perform bed mobility at Supervision level consistently while adhering to spinal precautions PT Short Term Goal 3 (Week 1): Pt will ambulate x 100 ft with LRAD and CGA PT Short Term Goal 4 (Week 1): Pt will initiate stair training  Skilled Therapeutic Interventions/Progress Updates: Pt presented in recliner agreeable to therapy. Pt states pain 4/10. Pt had recently received trigger point injections from MD therefore instructed per MD to not perform strenuous activities this session. Therefore session focused on ambulation and endurance. Performed x 2 bouts of ambulation with pt performed Sit to stand with CGA and heavy use of BUE. Pt ambulated 68ft and 56ft with w/c follow. Pt noted to be limited by pain in L flank. Pt required increased time between bouts for recovery. Pt then transported to NuStep and performed stand pivot to NuStep with CGA. Pt participated in NuStep L5 x 13 min for general conditioning with pt maintaining between 20-30 SPM. Pt then ambulated an additional 29ft back to room and at EOB. Pt required minA for BLE management for sit to supine. Pt able to reposition to comfort and left with bed alarm on, call bell within reach and IV team present.      Therapy Documentation Precautions:  Precautions Precautions: Back Precaution Comments: 3/3 recalled Required Braces or Orthoses: Other Brace (L ankle) Spinal Brace Comments: No brace per MD note Other Brace: Utilized lace up ankle brace today that pt brought from home Restrictions Weight Bearing Restrictions: No General:   Vital Signs: Therapy  Vitals Temp: 98.5 F (36.9 C) Temp Source: Oral Pulse Rate: 79 Resp: 18 BP: (!) 152/101 Patient Position (if appropriate): Lying Oxygen Therapy SpO2: 96 % O2 Device: Room Air Pain: Pain Assessment Pain Scale: 0-10 Pain Score: 4  Pain Type: Surgical pain Pain Location: Back Pain Orientation: Left;Lower Pain Descriptors / Indicators: Aching;Constant Pain Frequency: Constant Pain Onset: On-going Patients Stated Pain Goal: 2 Pain Intervention(s): Medication (See eMAR) Mobility:   Locomotion :    Trunk/Postural Assessment :    Balance:   Exercises:   Other Treatments:      Therapy/Group: Individual Therapy  Serene Kopf 08/24/2021, 3:39 PM

## 2021-08-24 NOTE — IPOC Note (Signed)
Overall Plan of Care [facility]) Patient Details Name: Keith [name] MRN: [id number] DOB: [birth year]  Admitting Diagnosis: Thoracic myelopathy  Hospital Problems: Principal Problem:   Thoracic myelopathy     Functional Problem List: Nursing Bowel, Medication Management, Endurance, Pain, Safety, Skin Integrity  PT Balance, Endurance, Motor, Pain, Safety, Sensory  OT Balance, Edema, Endurance, Pain, Motor, Safety  SLP    TR         Basic ADL's: OT Grooming, Bathing, Dressing, Toileting     Advanced  ADL's: OT       Transfers: PT Bed Mobility, Bed to Chair, Car, Furniture, Floor  OT Toilet, Metallurgist: PT Ambulation, Emergency planning/management officer, Stairs     Additional Impairments: OT None  SLP        TR      Anticipated Outcomes Item Anticipated Outcome  Self Feeding no goal  Swallowing      Basic self-care  Media planner Transfers Supervision  Bowel/Bladder  min assist to manage constipation  Transfers  Supervision  Locomotion  Supervision with LRAD at ambulatory level  Communication     Cognition     Pain  < 3  Safety/Judgment  Min assist and no falls   Therapy Plan: PT Intensity: Minimum of 1-2 x/day ,45 to 90 minutes PT Frequency: 5 out of 7 days PT Duration Estimated Length of Stay: 10-12 days OT Intensity: Minimum of 1-2 x/day, 45 to 90 minutes OT Frequency: 5 out of 7 days OT Duration/Estimated Length of Stay: 10-12 days     Due to the current state of emergency, patients may not be receiving their 3-hours of Medicare-mandated therapy.   Team Interventions: Nursing Interventions Patient/Family Education, Bowel Management, Disease Management/Prevention, Pain Management, Medication Management, Skin Care/Wound Management, Discharge Planning  PT interventions Ambulation/gait training, Balance/vestibular training, Discharge planning, Disease management/prevention, DME/adaptive equipment instruction,  Functional electrical stimulation, Functional mobility training, Neuromuscular re-education, Pain management, Patient/family education, Splinting/orthotics, Stair training, Therapeutic Activities, Therapeutic Exercise, UE/LE Strength taining/ROM, UE/LE Coordination activities, Wheelchair propulsion/positioning  OT Interventions Balance/vestibular training, Discharge planning, Pain management, Self Care/advanced ADL retraining, Therapeutic Activities, UE/LE Coordination activities, Visual/perceptual remediation/compensation, Therapeutic Exercise, Skin care/wound managment, Patient/family education, Functional mobility training, Disease mangement/prevention, Community reintegration, Engineer, drilling, Neuromuscular re-education, Psychosocial support, Splinting/orthotics, UE/LE Strength taining/ROM, Wheelchair propulsion/positioning  SLP Interventions    TR Interventions    SW/CM Interventions Discharge Planning, Psychosocial Support, Patient/Family Education   Barriers to Discharge MD  Medical stability, Home enviroment access/loayout, Wound care, Weight, and Weight bearing restrictions  Nursing Home environment access/layout, Decreased caregiver support, Wound Care, Lack of/limited family support, Weight, Weight bearing restrictions, Medication compliance Lives in 2 level home with 2 steps to enter, and right rail. Bedroom/bathroom on 2nd level with 11 steps, and half bath on main level. Lives with wife who can provide assist 24/7 at discharge.  PT Home environment access/layout    OT Home environment access/layout B&B upstairs with 1/2 bath on main  SLP      SW       Team Discharge Planning: Destination: PT-Home ,OT- Home , SLP-  Projected Follow-up: PT-Home health PT, OT-  Home health OT, SLP-  Projected Equipment Needs: PT-None recommended by PT, OT- To be determined, SLP-  Equipment Details: PT-pt already owns RW, OT-  Patient/family involved in discharge planning: PT-  Patient,  OT-Patient, SLP-   MD ELOS: 10-12 days Medical Rehab Prognosis:  Good Assessment: Pt is a 70 yr old  male with hx of thoracic myelopathy and LE weakness as well as myofascial trigger points- main limiter of therapy-  Also HTN, DM, and neuropathy; Has new AKI- with rising Cr- starting IVFs.   Goals Supervision by d/c-     See Team Conference Notes for weekly updates to the plan of care

## 2021-08-24 NOTE — Progress Notes (Signed)
Occupational Therapy Session Note  Patient Details  Name: Keith [name] MRN: [id number] Date of Birth: October 18, 1950  Today's Date: 08/24/2021 OT Group Time: 0102-7253 OT Group Time Calculation (min): 45 min   Short Term Goals: Week 1:  OT Short Term Goal 1 (Week 1): Pt will perform BSC/toilet transfers with CGA and LRAD OT Short Term Goal 2 (Week 1): Pt will don LB clothing with AE PRN with CGA OT Short Term Goal 3 (Week 1): Pt will perform shower level bathing with CGA  Skilled Therapeutic Interventions/Progress Updates:  Pt participated in group session with a focus on stress mgmt, education on healthy coping strategies, and social interaction. Focus of session on providing coping strategies to manage new current level of function as a result of new diagnosis.  Session focus on breaking down stressors into "daily hassles," "major life stressors" and "life circumstances" in an effort to allow pts to chunk their stressors into groups. Pt actively sharing stressors and contributing to group conversation. Pt very encouraging and friendly to all group members taking the time to encourage other group members in their rehab stay. Provided active listening, emotional support and therapeutic use of self during conversations pertaining to pts stressors. Offered education on factors that protect [place] against stress such as "daily uplifts," "healthy coping strategies" and "protective factors." Encouraged all group members to make an effort to actively recall one event from their day that was a daily uplift in an effort to protect their mindset from stressors as well as sharing this information with their caregivers to facilitate improved caregiver communication and decrease overall burden of care.  Issued pt handouts on healthy coping strategies to implement into routine. Pt transported back to room by RT.  Therapy Documentation Precautions:  Precautions Precautions: Back Precaution Comments: 3/3  recalled Required Braces or Orthoses: Other Brace (L ankle) Spinal Brace Comments: No brace per MD note Other Brace: Utilized lace up ankle brace today that pt brought from home Restrictions Weight Bearing Restrictions: No  Pain: no pain reported during session     Therapy/Group: Group Therapy  Precious Haws 08/24/2021, 4:10 PM

## 2021-08-25 LAB — BASIC METABOLIC PANEL
Anion gap: 9 (ref 5–15)
BUN: 21 mg/dL (ref 8–23)
CO2: 21 mmol/L — ABNORMAL LOW (ref 22–32)
Calcium: 9.2 mg/dL (ref 8.9–10.3)
Chloride: 110 mmol/L (ref 98–111)
Creatinine, Ser: 1.3 mg/dL — ABNORMAL HIGH (ref 0.61–1.24)
GFR, Estimated: 59 mL/min — ABNORMAL LOW (ref >60)
Glucose, Bld: 135 mg/dL — ABNORMAL HIGH (ref 70–99)
Potassium: 3.8 mmol/L (ref 3.5–5.1)
Sodium: 140 mmol/L (ref 135–145)

## 2021-08-25 LAB — GLUCOSE, CAPILLARY: Glucose-Capillary: 146 mg/dL — ABNORMAL HIGH (ref 70–99)

## 2021-08-25 NOTE — Progress Notes (Signed)
Occupational Therapy Session Note  Patient Details  Name: Keith [name] MRN: [id number] Date of Birth: 07-Sep-1951  Today's Date: 08/25/2021 OT Individual Time: 6384-5364 OT Individual Time Calculation (min): 70 min    Short Term Goals: Week 1:  OT Short Term Goal 1 (Week 1): Pt will perform BSC/toilet transfers with CGA and LRAD OT Short Term Goal 2 (Week 1): Pt will don LB clothing with AE PRN with CGA OT Short Term Goal 3 (Week 1): Pt will perform shower level bathing with CGA  Skilled Therapeutic Interventions/Progress Updates:    OT intervention with focus on bed mobility, sit<>stand, standing balance, functional amb with RW, BADLs, and safety awarenss. Supine>sit EOB with HOB elevated with supervision. Stand pivot transfer with RW with supervision. Pt propelled w/c to sink and completed bathing/dressing tasks with sit<>stand from w/c at sink. Pt used reacher for threading underpants and pants. Sit<>stand with supervison. Standing balance with CGA. Pt with IV and only wearing hospital gown for UB. Pt propelled w/c to other side of bed and tranfserred to recliner with CGA. Pt remained in recliner with all needs within reach.   Therapy Documentation Precautions:  Precautions Precautions: Back Precaution Comments: 3/3 recalled Required Braces or Orthoses: Other Brace (L ankle) Spinal Brace Comments: No brace per MD note Other Brace: Utilized lace up ankle brace today that pt brought from home Restrictions Weight Bearing Restrictions: No   Pain: Pt c/o 5/10 Lt lower back pain, especially where trigger point is; meds admin at beginning of session  Therapy/Group: Individual Therapy  Leroy Libman 08/25/2021, 10:38 AM

## 2021-08-25 NOTE — Progress Notes (Signed)
PROGRESS NOTE   Subjective/Complaints:  Pt reports wants to keep lidoderm to see if will help more after injections- thngs are going MUCH better- not resolved, but a lot better/trigger point-  LBM yesterday.  Pain leveled off.  And able to do lots of walking yesterday- best sleep had in hospital after injections.   ROS:  Pt denies SOB, abd pain, CP, N/V/C/D, and vision changes     Objective:   No results found. No results for input(s): WBC, HGB, HCT, PLT in the last 72 hours.  Recent Labs    08/24/21 0514 08/25/21 0615  NA 139 140  K 4.3 3.8  CL 109 110  CO2 23 21*  GLUCOSE 116* 135*  BUN 27* 21  CREATININE 1.67* 1.30*  CALCIUM 9.3 9.2    Intake/Output Summary (Last 24 hours) at 08/25/2021 0920 Last data filed at 08/25/2021 0800 Gross per 24 hour  Intake 290.95 ml  Output 1000 ml  Net -709.05 ml        Physical Exam: Vital Signs Blood pressure 112/70, pulse 85, temperature 97.8 F (36.6 C), temperature source Oral, resp. rate 17, height 6' (1.829 m), weight 125.8 kg, SpO2 95 %.      General: awake, alert, appropriate, sitting up in bed; NAD HENT: conjugate gaze; oropharynx moist CV: regular rate; no JVD Pulmonary: CTA B/L; no W/R/R- good air movement GI: soft, NT, ND, (+)BS Psychiatric: appropriate Neurological: [name]  MS: muscle spasm in Left low back/mid back- feels much better/less tight Skin: No evidence of breakdown, no evidence of rash- incision on back C/D/I-  Neurologic: Cranial nerves II through XII intact, motor strength is 5/5 in bilateral deltoid, bicep, tricep, grip, 2-hip flexor,3- knee extensors, 2- ankle dorsiflexor and plantar flexor Sensory exam normal sensation to light touch and reduced Right toe proprioception in bilateral upper and lower extremities   Assessment/Plan: 1. Functional deficits which require 3+ hours per day of interdisciplinary therapy in a comprehensive  inpatient rehab setting. Physiatrist is providing close team supervision and 24 hour management of active medical problems listed below. Physiatrist and rehab team continue to assess barriers to discharge/monitor patient progress toward functional and medical goals  Care Tool:  Bathing    Body parts bathed by patient: Right arm, Left arm, Chest, Abdomen, Front perineal area, Buttocks, Right upper leg, Left upper leg, Right lower leg, Face   Body parts bathed by helper: Right lower leg, Left lower leg     Bathing assist Assist Level: Minimal Assistance - Patient > 75%     Upper Body Dressing/Undressing Upper body dressing   What is the patient wearing?: Pull over shirt    Upper body assist Assist Level: Set up assist    Lower Body Dressing/Undressing Lower body dressing      What is the patient wearing?: Underwear/pull up, Pants     Lower body assist Assist for lower body dressing: Contact Guard/Touching assist Assistive Device Comment: walker   Toileting Toileting    Toileting assist Assist for toileting: Moderate Assistance - Patient 50 - 74% Assistive Device Comment: walker   Transfers Chair/bed transfer  Transfers assist     Chair/bed transfer assist level: Minimal Assistance -  Patient > 75%     Locomotion Ambulation   Ambulation assist      Assist level: Contact Guard/Touching assist Assistive device: Walker-rolling Max distance: 8'   Walk 10 feet activity   Assist     Assist level: Contact Guard/Touching assist Assistive device: Walker-rolling   Walk 50 feet activity   Assist Walk 50 feet with 2 turns activity did not occur: Safety/medical concerns  Assist level: Contact Guard/Touching assist Assistive device: Walker-rolling    Walk 150 feet activity   Assist Walk 150 feet activity did not occur: Safety/medical concerns         Walk 10 feet on uneven surface  activity   Assist Walk 10 feet on uneven surfaces activity did  not occur: Safety/medical concerns         Wheelchair     Assist Is the patient using a wheelchair?: Yes (TBD) Type of Wheelchair: Manual    Wheelchair assist level: Supervision/Verbal cueing Max wheelchair distance: 100'    Wheelchair 50 feet with 2 turns activity    Assist        Assist Level: Supervision/Verbal cueing   Wheelchair 150 feet activity     Assist      Assist Level: Moderate Assistance - Patient 50 - 74%   Blood pressure 112/70, pulse 85, temperature 97.8 F (36.6 C), temperature source Oral, resp. rate 17, height 6' (1.829 m), weight 125.8 kg, SpO2 95 %.    Medical Problem List and Plan: 1.  Decline in self care and mobility  secondary to THoracic myelopathy             -patient may shower             -ELOS/Goals: 14-16d Sup/Min A  D/c date set as 11/26  11/18- con't PT and OT- CIR- pain less limiting 2.  Antithrombotics: -DVT/anticoagulation:  Pharmaceutical: Lovenox             -antiplatelet therapy: N/A 3. Chronic Pain Management:  Sees Dr. Glenford Bayley on oxycodone 5 mg qid, gabapentin and Cymbalta.   --Continue Oxycodone prn 11/16- skelaxin didn't work- will d/c- will try Lidoderm patches 8pm to 8am and flexeril nightly 10 mg- if doesn't help, will do trigger point injections tomorrow   11/17- will do trigger point injections today- as per below-   11/18- Pain doing better- able to do more in therapy- con't regimen 4. Mood: LCSW to follow for evaluation and support.              -antipsychotic agents: N/A 5. Neuropsych: This patient is capable of making decisions on his own behalf. 6. Skin/Wound Care: Monitor wound for healing.  7. Fluids/Electrolytes/Nutrition: Monitor I/O.  8. T2DM: Hgb A1c-6.2 and BS controlled on low dose metformin --Monitor BS ac/hs and use SSI as needed.  11/16- will add CBGs 2x/day before meals and monitor 11/17- D/c metformin- due to Cr rising.  11/18-BGs slightly elevated, but will keep off Metformin due  to increase in Cr.  9. HTN; BP TID--on atenolol/hygroton and low dose catapres.   11/15- BP high prior to BP meds- but 103/70 after meds- con't regimen- but if feels dizzy, will decrease Bpmeds  11/16- BP soft this AM- 408X systolic- will d/w therapy if orthostatic  11/18- doing better after fluids- con't regimen 11. Neuropathy: continue Gabapentin TID. 12. Dyslipidemia: ON Zocor 13. AKI and ABLA-  Will recheck labs in am.    11/15- Hb stable at 10.3; Cr slightly better at 1.52- but still  elevated- will push fluids and recheck Thursday- if still elevated, use IVFs.   11/16- asked pt to push fluids- labs again tomorrow  11/17- Cr up to 1.67- will start IVFs 75cc/hour x 1 day and recheck BMP in AM  11/18- Cr down to baseline 1.3- will stop IVFs and recheck Monday- advised he needs to drink 8 cups/water/day.    08/24/21 Patient here for trigger point injections for  Consent done and on chart.  Cleaned areas with alcohol and injected using a 27 gauge 1.5 inch needle  Injected  Using 1% Lidocaine with no EPI  Upper traps Levators Posterior scalenes Middle scalenes Splenius Capitus Pectoralis Major Rhomboids Infraspinatus Teres Major/minor Thoracic paraspinals on L x4 Lumbar paraspinals Other injections-     Current level of pain after injections is decreased from the 7-8/10 he had- feels like it's loosening up.  NO bleeding and complications    LOS: 4 days A FACE TO FACE EVALUATION WAS PERFORMED  Efe Fazzino 08/25/2021, 9:20 AM

## 2021-08-25 NOTE — Progress Notes (Signed)
Physical Therapy Session Note  Patient Details  Name: Keith [name] MRN: [id number] Date of Birth: 1950/12/24  Today's Date: 08/25/2021 PT Individual Time: 4098-1191 and 1403-1450 PT Individual Time Calculation (min): 80 min and 47 min  Short Term Goals: Week 1:  PT Short Term Goal 1 (Week 1): Pt will complete least restrictive transfers with min A consistently PT Short Term Goal 2 (Week 1): Pt will perform bed mobility at Supervision level consistently while adhering to spinal precautions PT Short Term Goal 3 (Week 1): Pt will ambulate x 100 ft with LRAD and CGA PT Short Term Goal 4 (Week 1): Pt will initiate stair training  Skilled Therapeutic Interventions/Progress Updates: Pt presented in recliner agreeable to therapy. Pt states some frustration due to currently receiving IV fluids and unable to complete am dressing. Per pt IV can be removed at 10, discussed with pt that we can easily participate in PT in room until then. Pt states continues to have trigger point pain but a little less intense than prior to injections. Pt performed Sit to stand from recliner and performed standing hip abd/add x 10 bilaterally. Pt noted to have increased difficulty performing in R vs L. Pt returned to sitting and performed LAQ x 15 bilaterally. PTA provided support under RLE for greater ease of movement. PTA discussed creating HEP for RLE strengthening to which pt was agreeable. Nsg then arrived for IV removal. Once completed pt able to doff gown and don shirt with set up. Pt then ambulated ~52f with CGA and w/c follow towards elevator. Pt transported remaining distance to 4th floor gym. Pt then performed stand pivot transfer to high/low mat and performed sit to supine with minA for RLE management. Pt then participated in hip abd/add, AA heel slides, and SAQ x 10-12 bilaterally. Pt then returned to sitting with CGA and increased time. Performed ambulatory transfer to w/c and transported back to room. In room  pt performed ambulatory transfer to recliner from w/c. Pt left in recliner at end of session with call bell within reach and needs met.   Tx2: Pt presented in recliner agreeable to therapy. Pt noted to have ice pack and states since after lunch ms spasm has been more frequent. Pt agreeable to work on ambulation and functional transfers. Pt ambulated `663fwith RW and CGA. Pt noted to have intermittent genu recurvatum in RLE during ambulation. Pt transferred remaining distance to 4th floor and pt performed stand pivot transfer to high/low mat. Pt noted to have ms spasm in w/c prior to transfer with per pt and as observed by this therapist different from baseline. [facility] to stand from elevated mat with emphasis on using BLE vs pushing up from RW. Pt continues to maintain B knees in flexed position and pushing up from RW despite multimodal cues. After 4th stand pt with another ms spasm with pt yelling in pain. Pt requesting to return to room due to pain. Pt was able to ambulate another 458fith RW and CGA then was transported remaining distance back to room. Pt performed ambulatory transfer in room from w/c to recliner. In recliner pt positioned with pillow behind L flank, ice pack, and legs elevated. Pt left in recliner at end of session with call bell within reach and current needs met.       Therapy Documentation Precautions:  Precautions Precautions: Back Precaution Comments: 3/3 recalled Required Braces or Orthoses: Other Brace (L ankle) Spinal Brace Comments: No brace per MD  note Other Brace: Utilized lace up ankle brace today that pt brought from home Restrictions Weight Bearing Restrictions: No General: PT Amount of Missed Time (min): 13 Minutes PT Missed Treatment Reason: Pain Vital Signs:   Pain: Pain Assessment Pain Scale: 0-10 Pain Score: 5  Pain Type: Surgical pain Pain Location: Back Pain Orientation: Left;Lower Pain Descriptors / Indicators:  Aching;Constant Pain Frequency: Constant Pain Onset: On-going Patients Stated Pain Goal: 1 Pain Intervention(s): Pain med given for lower pain score than stated, per patient request Multiple Pain Sites: No   Therapy/Group: Individual Therapy  Ahamed Hofland Tahj Lindseth, PTA  08/25/2021, 12:15 PM

## 2021-08-25 NOTE — Plan of Care (Signed)
  Problem: Consults Goal: RH SPINAL CORD INJURY PATIENT EDUCATION Description:  See Patient Education module for education specifics.  Outcome: Progressing Goal: Skin Care Protocol Initiated - if Braden Score 18 or less Description: If consults are not indicated, leave blank or document N/A Outcome: Progressing   Problem: SCI BOWEL ELIMINATION Goal: RH STG MANAGE BOWEL WITH ASSISTANCE Description: STG Manage Bowel with [place]. Outcome: Progressing Goal: RH STG SCI MANAGE BOWEL WITH MEDICATION WITH ASSISTANCE Description: STG SCI Manage bowel with medication with Min assistance. Outcome: Progressing   Problem: RH SKIN INTEGRITY Goal: RH STG MAINTAIN SKIN INTEGRITY WITH ASSISTANCE Description: STG Maintain Skin Integrity With [place]. Outcome: Progressing Goal: RH STG ABLE TO PERFORM INCISION/WOUND CARE W/ASSISTANCE Description: STG Able To Perform Incision/Wound Care With [employer]. Outcome: Progressing   Problem: RH SAFETY Goal: RH STG ADHERE TO SAFETY PRECAUTIONS W/ASSISTANCE/DEVICE Description: STG Adhere to Safety Precautions With Cues and Reminders. Outcome: Progressing Goal: RH STG DECREASED RISK OF FALL WITH ASSISTANCE Description: STG Decreased Risk of Fall With [employer]. Outcome: Progressing   Problem: RH PAIN MANAGEMENT Goal: RH STG PAIN MANAGED AT OR BELOW PT'S PAIN GOAL Description: < 3 on a 0-10 pain scale. Outcome: Progressing   Problem: RH KNOWLEDGE DEFICIT SCI Goal: RH STG INCREASE KNOWLEDGE OF SELF CARE AFTER SCI Description: Patient will demonstrate knowledge of medication/pain management, skin/wound care, and back precautions with educational materials and handouts provided by staff independently at discharge. Outcome: Progressing

## 2021-08-26 DIAGNOSIS — D62 Acute posthemorrhagic anemia: Secondary | ICD-10-CM

## 2021-08-26 DIAGNOSIS — I10 Essential (primary) hypertension: Secondary | ICD-10-CM

## 2021-08-26 DIAGNOSIS — G9520 Unspecified cord compression: Secondary | ICD-10-CM

## 2021-08-26 DIAGNOSIS — N179 Acute kidney failure, unspecified: Secondary | ICD-10-CM

## 2021-08-26 DIAGNOSIS — E1165 Type 2 diabetes mellitus with hyperglycemia: Secondary | ICD-10-CM

## 2021-08-26 LAB — GLUCOSE, CAPILLARY
Glucose-Capillary: 121 mg/dL — ABNORMAL HIGH (ref 70–99)
Glucose-Capillary: 164 mg/dL — ABNORMAL HIGH (ref 70–99)

## 2021-08-26 NOTE — Progress Notes (Signed)
PROGRESS NOTE   Subjective/Complaints: Patient seen sitting up in bed this morning.  He states he slept well overnight.  He notes that yesterday he had a trigger point flare in his left lower back.  He notes this location was injected previously with improvement and notes improvement overnight and this morning, but is uncertain if it will flare again today.  ROS: Denies CP, SOB, N/V/D   Objective:   No results found. No results for input(s): WBC, HGB, HCT, PLT in the last 72 hours.  Recent Labs    08/24/21 0514 08/25/21 0615  NA 139 140  K 4.3 3.8  CL 109 110  CO2 23 21*  GLUCOSE 116* 135*  BUN 27* 21  CREATININE 1.67* 1.30*  CALCIUM 9.3 9.2     Intake/Output Summary (Last 24 hours) at 08/26/2021 0855 Last data filed at 08/25/2021 2130 Gross per 24 hour  Intake 1839.88 ml  Output 300 ml  Net 1539.88 ml         Physical Exam: Vital Signs Blood pressure 109/71, pulse 81, temperature 98 F (36.7 C), resp. rate 18, height 6' (1.829 m), weight 125.8 kg, SpO2 94 %. Constitutional: No distress . Vital signs reviewed. HENT: Normocephalic.  Atraumatic. Eyes: EOMI. No discharge. Cardiovascular: No JVD.  RRR. Respiratory: Normal effort.  No stridor.  Bilateral clear to auscultation. GI: Non-distended.  BS +. Skin: Warm and dry.  Intact. Psych: Normal mood.  Normal behavior. Musc: No edema in extremities.  No tenderness in extremities. Neuro: Alert Motor: 5/5 in bilateral deltoid, bicep, tricep, grip Bilateral lower extremities: 2-hip flexor,3- knee extensors, 2- ankle dorsiflexor and plantar flexor, Slowly improving   Assessment/Plan: 1. Functional deficits which require 3+ hours per day of interdisciplinary therapy in a comprehensive inpatient rehab setting. Physiatrist is providing close team supervision and 24 hour management of active medical problems listed below. Physiatrist and rehab team continue to  assess barriers to discharge/monitor patient progress toward functional and medical goals  Care Tool:  Bathing    Body parts bathed by patient: Right arm, Left arm, Chest, Abdomen, Front perineal area, Buttocks, Right upper leg, Left upper leg, Right lower leg, Face   Body parts bathed by helper: Right lower leg, Left lower leg     Bathing assist Assist Level: Minimal Assistance - Patient > 75%     Upper Body Dressing/Undressing Upper body dressing   What is the patient wearing?: Hospital gown only    Upper body assist Assist Level: Supervision/Verbal cueing    Lower Body Dressing/Undressing Lower body dressing      What is the patient wearing?: Underwear/pull up, Pants     Lower body assist Assist for lower body dressing: Contact Guard/Touching assist Assistive Device Comment: walker   Toileting Toileting    Toileting assist Assist for toileting: Moderate Assistance - Patient 50 - 74% Assistive Device Comment: walker   Transfers Chair/bed transfer  Transfers assist     Chair/bed transfer assist level: Minimal Assistance - Patient > 75%     Locomotion Ambulation   Ambulation assist      Assist level: Contact Guard/Touching assist Assistive device: Walker-rolling Max distance: 95'   Walk 10 feet  activity   Assist     Assist level: Contact Guard/Touching assist Assistive device: Walker-rolling   Walk 50 feet activity   Assist Walk 50 feet with 2 turns activity did not occur: Safety/medical concerns  Assist level: Contact Guard/Touching assist Assistive device: Walker-rolling    Walk 150 feet activity   Assist Walk 150 feet activity did not occur: Safety/medical concerns         Walk 10 feet on uneven surface  activity   Assist Walk 10 feet on uneven surfaces activity did not occur: Safety/medical concerns         Wheelchair     Assist Is the patient using a wheelchair?: Yes (TBD) Type of Wheelchair: Manual     Wheelchair assist level: Supervision/Verbal cueing Max wheelchair distance: 100'    Wheelchair 50 feet with 2 turns activity    Assist        Assist Level: Supervision/Verbal cueing   Wheelchair 150 feet activity     Assist      Assist Level: Moderate Assistance - Patient 50 - 74%   Blood pressure 109/71, pulse 81, temperature 98 F (36.7 C), resp. rate 18, height 6' (1.829 m), weight 125.8 kg, SpO2 94 %.    Medical Problem List and Plan: 1.  Decline in self care and mobility  secondary to Thoracic myelopathy  Continue CIR 2.  Antithrombotics: -DVT/anticoagulation:  Pharmaceutical: Lovenox             -antiplatelet therapy: N/A 3. Chronic Pain Management:  Sees Dr. Glenford Bayley on oxycodone 5 mg qid, gabapentin and Cymbalta.   --Continue Oxycodone prn 11/16- skelaxin didn't work- d/ced-  Lidoderm patches 8pm to 8am and flexeril nightly 10 mg  trigger point injections   Controlled on 11/19 4. Mood: LCSW to follow for evaluation and support.              -antipsychotic agents: N/A 5. Neuropsych: This patient is capable of making decisions on his own behalf. 6. Skin/Wound Care: Monitor wound for healing.  7. Fluids/Electrolytes/Nutrition: Monitor I/O.  8. T2DM with hyperglycemia: Hgb A1c-6.2 and BS controlled on low dose metformin --Monitor BS ac/hs and use SSI as needed.  11/17- D/c metformin- due to Cr rising.  11/18-BGs slightly elevated, but will keep off Metformin due to increase in Cr.  Mildly elevated on 11/19 9. HTN; BP TID--on atenolol/hygroton and low dose catapres.   Controlled on 11/19 11. Neuropathy: continue gabapentin TID. 12. Dyslipidemia: ON Zocor 13. AKI and ABLA-     11/15- Hb stable at 10.3; Cr slightly better at 1.52  Creatinine 1.30 on 1/18, labs ordered for Monday  LOS: 5 days A FACE TO FACE EVALUATION WAS PERFORMED  Venkat Ankney Lorie Phenix 08/26/2021, 8:55 AM

## 2021-08-26 NOTE — Progress Notes (Signed)
Occupational Therapy Session Note  Patient Details  Name: Keith [name] MRN: [id number] Date of Birth: 23-Feb-1951  Today's Date: 09/01/2021 OT Individual Time: 1100-1200 OT Individual Time Calculation (min): 60 min   Short Term Goals: Week 1:  OT Short Term Goal 1 (Week 1): Pt will perform BSC/toilet transfers with CGA and LRAD OT Short Term Goal 1 - Progress (Week 1): Progressing toward goal OT Short Term Goal 2 (Week 1): Pt will don LB clothing with AE PRN with CGA OT Short Term Goal 2 - Progress (Week 1): Progressing toward goal OT Short Term Goal 3 (Week 1): Pt will perform shower level bathing with CGA OT Short Term Goal 3 - Progress (Week 1): Progressing toward goal  Skilled Therapeutic Interventions/Progress Updates:    Pt greeted in the recliner and premedicated for pain. Requested seated level activity today due to doing stairs/walking a lot this AM and wanting to continue with PT during PM session. Therefore tx focus was placed on increasing functional independence with LB dressing. Pt reported still having a tough time with footwear and needing assistance. Pt able to pull each LE into figure 4 position to doff footwear safely while adhering to back precautions. Education provided on breathing out with exertion when assisting LEs with movement. OT taught him how to use adaptive bag method to don his compression socks. Pt able to do himself with supervision!! Pt also able to use figure 4 position to don socks, Lt ankle brace, and sneakers with increased time and min cues for adaptive techniques. OT brought in several pieces of AE however pt only used his personal shoe horn to meet task demands. Pt VERY proud of his accomplishments today, stated that he has never donned the ankle brace by himself, that wife has always done this. We called wife on the phone and told her, she was very proud. OT guided him through LE exercises/stretches using a modified gait belt as pt is very motivated to  navigate up/down steps at home. Pt remained sitting up at close of session, all needs within reach.   Also discussed DME needs for home, pt already has all necessary bathroom equipment  Therapy Documentation Precautions:  Precautions Precautions: Back Precaution Comments: 3/3 recalled Required Braces or Orthoses: Other Brace (L ankle) Spinal Brace Comments: No brace per MD note Other Brace: Utilized lace up ankle brace today that pt brought from home Restrictions Weight Bearing Restrictions: No ADL: ADL Eating: Not assessed Grooming: Setup Upper Body Bathing: Setup Lower Body Bathing: Minimal assistance Upper Body Dressing: Setup Lower Body Dressing: Moderate assistance Toileting: Minimal assistance (simulated) Toilet Transfer: Minimal assistance   Therapy/Group: Individual Therapy  Keith [name] A [name] 09/01/2021, 12:37 PM

## 2021-08-26 NOTE — Progress Notes (Addendum)
Occupational Therapy Session Note  Patient Details  Name: Keith [name] MRN: [id number] Date of Birth: 1950-11-26  Today's Date: 08/26/2021 OT Individual Time: 7342-8768 OT Individual Time Calculation (min): 50 min patient missed 10 minutes due to c/o pain   Short Term Goals: Week 1:  OT Short Term Goal 1 (Week 1): Pt will perform BSC/toilet transfers with CGA and LRAD OT Short Term Goal 2 (Week 1): Pt will don LB clothing with AE PRN with CGA OT Short Term Goal 3 (Week 1): Pt will perform shower level bathing with CGA  Skilled Therapeutic Interventions/Progress Updates: Upon approach for OT services patient stated that he was fatigued from extreme "trigger points and muscle spasms" in his left side of back approximately 6' to the left of his back surgical site that he says started on yesterday.  As well, he stated that he was not sure that he had enough strength to complete therapy this morning.    He required rest breaks and participated as follows: supine to EOB transfer = close S and he asked for head bed to stay elevated at 20 degrees to ease his transfer.   Otherwise, he demonstrated adherence to sternal precautions during bed mobility and bed to w/c transfer.  he guarded all movements during initial transfers and required extral time to complete all movements due to c/o fatigue and asked that bed be elevated to ease his sit to stand pivot transfer.  He  completed bed to w/c transfer via rolling walker = CGA .     Bathing and dressing in w/c at sink = Hasty and extra time due to he stated he was trying to not cause any exteme pain or "trigger point activation" in his left slightly lateral back.  As well, he required demonstrative and verbal cue and safe technique instruction to wash his buttocks to with slight bends in knees to reach the way around to his bottom and avoid twisting.   As well, he was introduced to using the [place] sponge to back buttocks to remain with sternal  precautions.    He completed grooming seated in his w/c at sink, and stated he was trying "not to aggravate that painful spot."    He was asssisted w/c to recliner with close S and left there with his call bell at the end of the sessionl.     This gentleman will benefit from more opportunities to deomonstrate safe transfer and  movements in which he demonbstrates adherance to sternal precauations.   At the end of the session, he was left seated in his recliner with call bell and his cell phone within reach.  Continue OT plan of care.  Therapy Documentation Precautions:  Precautions Precautions: Back Precaution Comments: 3/3 recalled Required Braces or Orthoses: Other Brace (L ankle) Spinal Brace Comments: No brace per MD note Other Brace: Utilized lace up ankle brace today that pt brought from home Restrictions Weight Bearing Restrictions: No  PAIN: 8/10 spasms sharp and intermittent in left mid back trunk   RN had given meds right before therapy session   Therapy/Group: Individual Therapy  Alfredia Ferguson [facility] 08/26/2021, 10:22 AM

## 2021-08-26 NOTE — Progress Notes (Addendum)
Physical Therapy Session Note  Patient Details  Name: Keith [name] MRN: [id number] Date of Birth: [birth year]  Today's Date: 08/26/2021 PT Individual Time: 0865-7846; 9629-5284 PT Individual Time Calculation (min): 65 min , 70 min  Short Term Goals: Week 1:  PT Short Term Goal 1 (Week 1): Pt will complete least restrictive transfers with min A consistently PT Short Term Goal 2 (Week 1): Pt will perform bed mobility at Supervision level consistently while adhering to spinal precautions PT Short Term Goal 3 (Week 1): Pt will ambulate x 100 ft with LRAD and CGA PT Short Term Goal 4 (Week 1): Pt will initiate stair training  Skilled Therapeutic Interventions/Progress Updates:  Tx 1:  Pt resting in recliner.  He rated pain at surgical site 3/10, premedicated.  PT donned L ankle brace and crocs over non slip socks with pt assisting slightly.  Pt needed cues intermittently throughout session to avoid  breaking spinal precautions during movements.  Sit> stand from recliner (with wc cushion in seat) with min assist to RW.  Pivot with RW to L to wc, min assist. When sitting in wc, assisted in moving bil LEs off of footrests by extending knees to raise feet, rather than lifting thigh with hands when possible, throughout session.   Gait training on level tile with RW, x 68' with CGA and wc follow. Sitance limited by L lower back trigger point pain.  As pt fatigued, he did not clear R foot but slid foot along floor ,but did not trip.  neuromuscular re-education via forced use, multimodal cues for using Kinetron from wc level , no resistance, for bil LE alternating reciprocal movement, with assistance for RLE PRN,.20 cycles x 2 targeting quadriceps, ;20 cycles x 1 targeting gluteal muscles in more upright posture with bil UE support on handgrips.  Pt reported feeling thigh and buttock muscles activating.  At end of session, pt transferred to recliner with min assist, RW.  Ice pack provided for L  lower back trigger point, LEs elevated, needs at hand.  Tx 2:  Pt seated in recliner.  He rated surgical and L lower back trigger point pain 3/10, premedicated.  Sit> stand with min assist; pivot to wc to R, RW, min assist.   Gait training with RW x 56' including 2 turns, level tile, with poor foot clearance bil but no LOB.  Bil hip flexion weakness appears to limit foot clearance more than other factors.  UE strengthening via UBE, seated in w/c at level 7, x 5 minutes, backwards for upper back and posterior shoulder focus. LE strengthening , seated- 20 x 1 bil adductor squeezes.  Standing with bil UE support, pt attempted to perform R hip flexion, but could not clear foot.    Stand pivot wc> firm mat with mod assist , RW.  Sit> supine on firm mat with mod assist for trunk lowering and bil LE lifting.  Pt rolled to L side lying with min assist. L side lying> sitting with min assist.   In L side lying, neuromuscular re-education via demo, multimodal cues, using powder board under RLE, 10 x 1 active assistive knee flexion/active extension; 2 x 10 active assistive hip flexion/active hip extension.  Pt exhausted.  Use of SB for slightly downhill transfer mat> wc to L, min assist.  At end of session, pt transferred stand pivot with min assist, RW to recliner.  At end of session, pt resting in recliner, with needs at hand.  He will ask NT for  ice pack to place on L trigger point.     Therapy Documentation Precautions:  Precautions Precautions: Back Precaution Comments: 3/3 recalled Required Braces or Orthoses: Other Brace (L ankle) Spinal Brace Comments: No brace per MD note Other Brace: Utilized lace up ankle brace today that pt brought from home Restrictions Weight Bearing Restrictions: No     Therapy/Group: Individual Therapy  Lane Eland 08/26/2021, 12:26 PM

## 2021-08-27 LAB — GLUCOSE, CAPILLARY
Glucose-Capillary: 149 mg/dL — ABNORMAL HIGH (ref 70–99)
Glucose-Capillary: 106 mg/dL — ABNORMAL HIGH (ref 70–99)

## 2021-08-27 NOTE — Progress Notes (Signed)
Physical Therapy Session Note  Patient Details  Name: Keith [name] MRN: [id number] Date of Birth: 1951-02-13  Today's Date: 08/27/2021 PT Individual Time: 1507-1610 PT Individual Time Calculation (min): 63 min   Short Term Goals: Week 1:  PT Short Term Goal 1 (Week 1): Pt will complete least restrictive transfers with min A consistently PT Short Term Goal 2 (Week 1): Pt will perform bed mobility at Supervision level consistently while adhering to spinal precautions PT Short Term Goal 3 (Week 1): Pt will ambulate x 100 ft with LRAD and CGA PT Short Term Goal 4 (Week 1): Pt will initiate stair training  Skilled Therapeutic Interventions/Progress Updates:  Pt received sitting in recliner.  He rated pain 3/10 L lumbar area, premedicated. L ankle brace and bil shoes donned already.  Sit> stand pivot to wc to R, RW, CGA.  Gait training with RW, level tile x 92' including turns, CGa.  Pt weearing [employer] tennis shoes instead of Crocs that he wore yesterday; foot clearance bil better. Gait backwards x 2' with CGa, RW, which increased pain L lumbar trigger point.  Stand pivot transfer to firm mat, using RW, CGA.  Sit> supine with mod assist for bil LEs.  After resting in supine briefly, rolling L with min assist. Pt positioned comfortably in L side lying for use of powder board under RLE.   neuromuscular re-education via use of powderboard, multimodal cues for R knee active assistive flexion/active extension and R hip active flexion/active assistive extension with flexed knee to isolate gluts.   When resting between sets, passive stretch of quadriceps provided by flexed knee position. R hip flexion AROM increased today, compared to 08/26/21 session with this PT. Seated in wc, bil heel/toe raises, and propelling wc using bil LEs x 25' with min assistance.  At end of session, pt resting in wc with needs at hand.  He will ask NT for ice pack for L lumbar trigger point.     Therapy  Documentation Precautions:  Precautions Precautions: Back Precaution Comments: 3/3 recalled Required Braces or Orthoses: Other Brace (L ankle) Spinal Brace Comments: No brace per MD note Other Brace: Utilized lace up ankle brace today that pt brought from home Restrictions Weight Bearing Restrictions: No      Therapy/Group: Individual Therapy  [name] 08/27/2021, 4:29 PM

## 2021-08-27 NOTE — Plan of Care (Signed)
  Problem: Consults Goal: RH SPINAL CORD INJURY PATIENT EDUCATION Description:  See Patient Education module for education specifics.  Outcome: Progressing Goal: Skin Care Protocol Initiated - if Braden Score 18 or less Description: If consults are not indicated, leave blank or document N/A Outcome: Progressing   Problem: SCI BOWEL ELIMINATION Goal: RH STG MANAGE BOWEL WITH ASSISTANCE Description: STG Manage Bowel with [place]. Outcome: Progressing Goal: RH STG SCI MANAGE BOWEL WITH MEDICATION WITH ASSISTANCE Description: STG SCI Manage bowel with medication with Min assistance. Outcome: Progressing   Problem: RH SKIN INTEGRITY Goal: RH STG MAINTAIN SKIN INTEGRITY WITH ASSISTANCE Description: STG Maintain Skin Integrity With [place]. Outcome: Progressing Goal: RH STG ABLE TO PERFORM INCISION/WOUND CARE W/ASSISTANCE Description: STG Able To Perform Incision/Wound Care With [employer]. Outcome: Progressing   Problem: RH SAFETY Goal: RH STG ADHERE TO SAFETY PRECAUTIONS W/ASSISTANCE/DEVICE Description: STG Adhere to Safety Precautions With Cues and Reminders. Outcome: Progressing Goal: RH STG DECREASED RISK OF FALL WITH ASSISTANCE Description: STG Decreased Risk of Fall With [employer]. Outcome: Progressing   Problem: RH PAIN MANAGEMENT Goal: RH STG PAIN MANAGED AT OR BELOW PT'S PAIN GOAL Description: < 3 on a 0-10 pain scale. Outcome: Progressing   Problem: RH KNOWLEDGE DEFICIT SCI Goal: RH STG INCREASE KNOWLEDGE OF SELF CARE AFTER SCI Description: Patient will demonstrate knowledge of medication/pain management, skin/wound care, and back precautions with educational materials and handouts provided by staff independently at discharge. Outcome: Progressing

## 2021-08-27 NOTE — Progress Notes (Signed)
Physical Therapy Session Note  Patient Details  Name: Keith [name] MRN: [id number] Date of Birth: 10-01-1951  Today's Date: 08/27/2021 PT Individual Time: 1100-1200 PT Individual Time Calculation (min): 60 min   Short Term Goals: Week 1:  PT Short Term Goal 1 (Week 1): Pt will complete least restrictive transfers with min A consistently PT Short Term Goal 2 (Week 1): Pt will perform bed mobility at Supervision level consistently while adhering to spinal precautions PT Short Term Goal 3 (Week 1): Pt will ambulate x 100 ft with LRAD and CGA PT Short Term Goal 4 (Week 1): Pt will initiate stair training  Skilled Therapeutic Interventions/Progress Updates:    Pt received seated in recliner in room, agreeable to PT session. Minimal complaints of pain at rest, does have onset of sharp pain in trigger points in L posterior lower back that improve again at rest. Assisted pt with donning knee-high TEDs for edema management, socks, shoes, and L ankle brace. Sit to stand with min A to RW throughout session. Ambulation x 80 ft, x 65 ft with RW and CGA for balance, flexed trunk posture with decreased knee flexion and heel strike during gait. Pt's gait distance limited by pain. Manual w/c propulsion x 100 ft with use of BUE at Supervision level. Attempt seated HS curls, pt unable to flex knee in seated position without external assist from UE due to weakness. Encouraged pt to perform heel slides at bed level. Attempt to have pt perform seated LAQ and seated marches, pt exhibits minimal ability to extend knees in seated position and unable to flex hips from seated position. Seated hip abd x 15 reps with green theraband. Will continue to work towards LE strengthening during rehab stay. Pt left seated in recliner in room with needs in reach at end of session.  Therapy Documentation Precautions:  Precautions Precautions: Back Precaution Comments: 3/3 recalled Required Braces or Orthoses: Other Brace (L  ankle) Spinal Brace Comments: No brace per MD note Other Brace: Utilized lace up ankle brace today that pt brought from home Restrictions Weight Bearing Restrictions: No     Therapy/Group: Individual Therapy   Excell Seltzer, PT, DPT, CSRS  08/27/2021, 12:31 PM

## 2021-08-27 NOTE — Progress Notes (Signed)
Physical Therapy Session Note  Patient Details  Name: Keith [name] MRN: [id number] Date of Birth: 1951-04-21  Today's Date: 08/27/2021 PT Individual Time: 0932-1002 PT Individual Time Calculation (min): 30 min   Short Term Goals: Week 1:  PT Short Term Goal 1 (Week 1): Pt will complete least restrictive transfers with min A consistently PT Short Term Goal 2 (Week 1): Pt will perform bed mobility at Supervision level consistently while adhering to spinal precautions PT Short Term Goal 3 (Week 1): Pt will ambulate x 100 ft with LRAD and CGA PT Short Term Goal 4 (Week 1): Pt will initiate stair training  Skilled Therapeutic Interventions/Progress Updates:    pt received in bed and agreeable to therapy. No immediate c/o pain during session. Supine>sit with supervision. Stand pivot transfer to w/c with min A for balance and power up to stand. Pt then washed up,brushed teeth,  and shaved at sink with set up assist, CGA Sit to stand to wash bottom. Pt able to direct care and assistance throughout. Min A for LB dressing for time, superivison UB dressing. Pt then performed Stand step transfer with RW and min A to recliner and was left with all needs in reach and alarm active.    Therapy Documentation Precautions:  Precautions Precautions: Back Precaution Comments: 3/3 recalled Required Braces or Orthoses: Other Brace (L ankle) Spinal Brace Comments: No brace per MD note Other Brace: Utilized lace up ankle brace today that pt brought from home Restrictions Weight Bearing Restrictions: No     Therapy/Group: Individual Therapy  Mickel Fuchs 08/27/2021, 9:52 AM

## 2021-08-28 LAB — BASIC METABOLIC PANEL
Anion gap: 7 (ref 5–15)
BUN: 26 mg/dL — ABNORMAL HIGH (ref 8–23)
CO2: 25 mmol/L (ref 22–32)
Calcium: 9.3 mg/dL (ref 8.9–10.3)
Chloride: 108 mmol/L (ref 98–111)
Creatinine, Ser: 1.5 mg/dL — ABNORMAL HIGH (ref 0.61–1.24)
GFR, Estimated: 50 mL/min — ABNORMAL LOW (ref >60)
Glucose, Bld: 116 mg/dL — ABNORMAL HIGH (ref 70–99)
Potassium: 4 mmol/L (ref 3.5–5.1)
Sodium: 140 mmol/L (ref 135–145)

## 2021-08-28 LAB — CBC
HCT: 32.2 % — ABNORMAL LOW (ref 39.0–52.0)
Hemoglobin: 10.2 g/dL — ABNORMAL LOW (ref 13.0–17.0)
MCH: 30.9 pg (ref 26.0–34.0)
MCHC: 31.7 g/dL (ref 30.0–36.0)
MCV: 97.6 fL (ref 80.0–100.0)
Platelets: 313 10*3/uL (ref 150–400)
RBC: 3.3 MIL/uL — ABNORMAL LOW (ref 4.22–5.81)
RDW: 16.3 % — ABNORMAL HIGH (ref 11.5–15.5)
WBC: 5.7 10*3/uL (ref 4.0–10.5)
nRBC: 0 % (ref 0.0–0.2)

## 2021-08-28 LAB — GLUCOSE, CAPILLARY
Glucose-Capillary: 102 mg/dL — ABNORMAL HIGH (ref 70–99)
Glucose-Capillary: 109 mg/dL — ABNORMAL HIGH (ref 70–99)

## 2021-08-28 MED ORDER — DIAZEPAM 5 MG PO TABS
5.0000 mg | ORAL_TABLET | Freq: Every day | ORAL | Status: DC
  Administered 2021-08-28 – 2021-09-05 (×9): 5 mg via ORAL
  Filled 2021-08-28 (×10): qty 1

## 2021-08-28 NOTE — Progress Notes (Signed)
Physical Therapy Session Note  Patient Details  Name: Keith [name] MRN: [id number] Date of Birth: June 08, 1951  Today's Date: 08/28/2021 PT Individual Time: 4665-9935 PT Individual Time Calculation (min): 60 min   Short Term Goals: Week 1:  PT Short Term Goal 1 (Week 1): Pt will complete least restrictive transfers with min A consistently PT Short Term Goal 2 (Week 1): Pt will perform bed mobility at Supervision level consistently while adhering to spinal precautions PT Short Term Goal 3 (Week 1): Pt will ambulate x 100 ft with LRAD and CGA PT Short Term Goal 4 (Week 1): Pt will initiate stair training Week 2:    Week 3:     Skilled Therapeutic Interventions/Progress Updates:    Pt initially oob in recliner and c/o L mid to lower thoracic pain which he is able to pinpoint and dexcribes as stabbing.  States he awoke w/pain after fitful night.  States pain is "tolerable" in sitting but has been limiting him w/therapy.  Does not give number, states pain meds not due for 1 hr.  See below for soft tissue mobilization.  Sit to stand from recliner w/mod assist and heavy reliance on Ues, slow to come to full upright. Gait 48feet around bed/turn/sit to wc w/min assist and cues for controlled lowering.  Pt transported to gym stand pivot transfer to mat w/mod assist and RW Sit to supne w/mod assist for LE management. Pt performed the following strengthening exercises: Hip IR/ER w/legs extended Hip abd/ADD using powderboard w/AAROM provided at end range abd. Sidelying hip flexion/extension/ AAROM at end range Sdielying knee flexion/extension, AAROM end range flexion and quad stretching also perfomed by therapist d/t significant tightness. During therex in s/l therapist performed deep pressure and cross friction massage to lateral border of lower traps mid thoracic origin of lats w/pt reporting overall decreased "trigger point pain" w/and after rx.  Pt sidelying sit w/min assist.  stand pivot  transfer to wc w/mod assist.  Transported to room.  stand pivot transfer to recliner w/mod assist d/t difficulty w/Sit to stand, RW. Pt left oob in recliner w/chair alarm set and needs in reach.  STAIRS MAT TABLE HAMSTRING AND HIP STRENGTHENING  Therapy Documentation Precautions:  Precautions Precautions: Back Precaution Comments: 3/3 recalled Required Braces or Orthoses: Other Brace (L ankle) Spinal Brace Comments: No brace per MD note Other Brace: Utilized lace up ankle brace today that pt brought from home Restrictions Weight Bearing Restrictions: No   Therapy/Group: Individual Therapy  Jerrilyn Cairo 08/28/2021, 12:03 PM

## 2021-08-28 NOTE — Progress Notes (Signed)
PROGRESS NOTE   Subjective/Complaints:  Pt reports Saturday had a flare of pain in L low back/trigger point- was better for 2 days, but then acted up again.  Sunday was better.  Pain still impedes "80%" of recovery- also anticipating pain. Which makes things difficult.   BM daily- no issues; peeing well.    ROS:  Pt denies SOB, abd pain, CP, N/V/C/D, and vision changes    Objective:   No results found. Recent Labs    08/28/21 0557  WBC 5.7  HGB 10.2*  HCT 32.2*  PLT 313    Recent Labs    08/28/21 0557  NA 140  K 4.0  CL 108  CO2 25  GLUCOSE 116*  BUN 26*  CREATININE 1.50*  CALCIUM 9.3    Intake/Output Summary (Last 24 hours) at 08/28/2021 0839 Last data filed at 08/28/2021 0732 Gross per 24 hour  Intake 773 ml  Output 2175 ml  Net -1402 ml        Physical Exam: Vital Signs Blood pressure 109/75, pulse 79, temperature 97.7 F (36.5 C), temperature source Oral, resp. rate 18, height 6' (1.829 m), weight 125.8 kg, SpO2 96 %.    General: awake, alert, appropriate, sitting up somewhat in bed; still sleepy; NAD HENT: conjugate gaze; oropharynx moist CV: regular rate; no JVD Pulmonary: CTA B/L; no W/R/R- good air movement GI: soft, NT, ND, (+)BS- protuberant Psychiatric: appropriate- anticipating pain Neurological: [name] MS: TTP over L low back/palpable trigger point Motor: 5/5 in bilateral deltoid, bicep, tricep, grip Bilateral lower extremities: 2-hip flexor,3- knee extensors, 2- ankle dorsiflexor and plantar flexor, Slowly improving   Assessment/Plan: 1. Functional deficits which require 3+ hours per day of interdisciplinary therapy in a comprehensive inpatient rehab setting. Physiatrist is providing close team supervision and 24 hour management of active medical problems listed below. Physiatrist and rehab team continue to assess barriers to discharge/monitor patient progress toward functional  and medical goals  Care Tool:  Bathing    Body parts bathed by patient: Right arm, Left arm, Chest, Abdomen, Front perineal area, Buttocks, Right upper leg, Left upper leg, Right lower leg, Face   Body parts bathed by helper: Right lower leg, Left lower leg     Bathing assist Assist Level: Minimal Assistance - Patient > 75%     Upper Body Dressing/Undressing Upper body dressing   What is the patient wearing?: Hospital gown only    Upper body assist Assist Level: Supervision/Verbal cueing    Lower Body Dressing/Undressing Lower body dressing      What is the patient wearing?: Underwear/pull up, Pants     Lower body assist Assist for lower body dressing: Contact Guard/Touching assist Assistive Device Comment: walker   Toileting Toileting    Toileting assist Assist for toileting: Moderate Assistance - Patient 50 - 74% Assistive Device Comment: walker   Transfers Chair/bed transfer  Transfers assist     Chair/bed transfer assist level: Contact Guard/Touching assist     Locomotion Ambulation   Ambulation assist      Assist level: Contact Guard/Touching assist Assistive device: Walker-rolling Max distance: 92   Walk 10 feet activity   Assist  Assist level: Contact Guard/Touching assist Assistive device: Walker-rolling   Walk 50 feet activity   Assist Walk 50 feet with 2 turns activity did not occur: Safety/medical concerns  Assist level: Contact Guard/Touching assist Assistive device: Walker-rolling    Walk 150 feet activity   Assist Walk 150 feet activity did not occur: Safety/medical concerns         Walk 10 feet on uneven surface  activity   Assist Walk 10 feet on uneven surfaces activity did not occur: Safety/medical concerns         Wheelchair     Assist Is the patient using a wheelchair?: Yes (TBD) Type of Wheelchair: Manual    Wheelchair assist level: Supervision/Verbal cueing Max wheelchair distance: 100'     Wheelchair 50 feet with 2 turns activity    Assist        Assist Level: Supervision/Verbal cueing   Wheelchair 150 feet activity     Assist      Assist Level: Moderate Assistance - Patient 50 - 74%   Blood pressure 109/75, pulse 79, temperature 97.7 F (36.5 C), temperature source Oral, resp. rate 18, height 6' (1.829 m), weight 125.8 kg, SpO2 96 %.    Medical Problem List and Plan: 1.  Decline in self care and mobility  secondary to Thoracic myelopathy  Continue CIR  11/21- con't PT and OT/CIR 2.  Antithrombotics: -DVT/anticoagulation:  Pharmaceutical: Lovenox             -antiplatelet therapy: N/A 3. Chronic Pain Management:  Sees Dr. Glenford Bayley on oxycodone 5 mg qid, gabapentin and Cymbalta.   --Continue Oxycodone prn 11/16- skelaxin didn't work- d/ced-  Lidoderm patches 8pm to 8am and flexeril nightly 10 mg  trigger point injections   11/21- will d/c flexeril and add Valium 5 mg QHS- and do trigger point injections again Tuesdya if need be.  4. Mood: LCSW to follow for evaluation and support.              -antipsychotic agents: N/A 5. Neuropsych: This patient is capable of making decisions on his own behalf. 6. Skin/Wound Care: Monitor wound for healing.  7. Fluids/Electrolytes/Nutrition: Monitor I/O.  8. T2DM with hyperglycemia: Hgb A1c-6.2 and BS controlled on low dose metformin --Monitor BS ac/hs and use SSI as needed.  11/17- D/c metformin- due to Cr rising.  11/18-BGs slightly elevated, but will keep off Metformin due to increase in Cr.  11/21- BG's 102-140s- con't without metformin due to Cr increase 9. HTN; BP TID--on atenolol/hygroton and low dose catapres.   Controlled on 11/19 11. Neuropathy: continue gabapentin TID. 12. Dyslipidemia: ON Zocor 13. AKI and ABLA-     11/15- Hb stable at 10.3; Cr slightly better at 1.52  Creatinine 1.30 on 1/18, labs ordered for Monday  11/21- Cr up to 1.5 and BUN 26- not drinking enough- push fluids- recheck  Wednesday- might need IVFs again.   LOS: 7 days A FACE TO FACE EVALUATION WAS PERFORMED  Lee-Ann Gal 08/28/2021, 8:39 AM

## 2021-08-28 NOTE — Progress Notes (Signed)
Occupational Therapy Weekly Progress Note  Patient Details  Name: Keith [name] MRN: [id number] Date of Birth: 09-19-1951  Beginning of progress report period: August 22, 2021 End of progress report period: August 28, 2021  Today's Date: 08/28/2021 OT Individual Time: 9122-5834 OT Individual Time Calculation (min): 23 min  and Today's Date: 08/28/2021 OT Missed Time: 52 Minutes Missed Time Reason: Patient fatigue   Patient has met 0 of 3 short term goals.  Pt is progressing toward OT goals but has been significantly limited by pain 2/2 trigger point/muscle pain in L lower back area. MD/nursing aware and are addressing. Pt is performing ADL transfers with Min A overall with most assist to propel self up into standing, CGA for LB dressing, and Min/Mod for toileting for posterior hygiene. Continue POC and prep for DC home.   Patient continues to demonstrate the following deficits: muscle weakness, decreased cardiorespiratoy endurance, decreased coordination and decreased motor planning, decreased motor planning, and decreased standing balance, decreased postural control, and decreased balance strategies and therefore will continue to benefit from skilled OT intervention to enhance overall performance with BADL and Reduce care partner burden.  Patient progressing toward long term goals..  Continue plan of care.  OT Short Term Goals Week 1:  OT Short Term Goal 1 (Week 1): Pt will perform BSC/toilet transfers with CGA and LRAD OT Short Term Goal 1 - Progress (Week 1): Progressing toward goal OT Short Term Goal 2 (Week 1): Pt will don LB clothing with AE PRN with CGA OT Short Term Goal 2 - Progress (Week 1): Progressing toward goal OT Short Term Goal 3 (Week 1): Pt will perform shower level bathing with CGA OT Short Term Goal 3 - Progress (Week 1): Progressing toward goal Week 2:  OT Short Term Goal 1 (Week 2): STGs = LTGs 2/2 LOS  Skilled Therapeutic Interventions/Progress Updates:     Pt greeted at time of OT session sitting up in recliner stating he had just finished with PT and he is "done for the day." Pt very politely declining all out of room activity, going to gym, ADL tasks, etc. However, pt conversational and wanting to discuss DC planning, goals for discharge, etc and educated on OT goals and POC. Discussion as well regarding layout of house and accessibility. Pt stating he is limited at this time by L lower back pain/trigger point and this OT performing STM at pt direction for finding trigger point. Minimal relief noted and returned hot pack. Pt politely declining further OT at this time. Missed 52 mins of OT 2/2 fatigue.   Therapy Documentation Precautions:  Precautions Precautions: Back Precaution Comments: 3/3 recalled Required Braces or Orthoses: Other Brace (L ankle) Spinal Brace Comments: No brace per MD note Other Brace: Utilized lace up ankle brace today that pt brought from home Restrictions Weight Bearing Restrictions: No     Therapy/Group: Individual Therapy  Viona Gilmore 08/28/2021, 7:30 AM

## 2021-08-28 NOTE — Progress Notes (Signed)
Physical Therapy Session Note  Patient Details  Name: Keith [name] MRN: [id number] Date of Birth: 1951/08/31  Today's Date: 08/28/2021 PT Individual Time: 0900-1000; 1300-1400 PT Individual Time Calculation (min): 60 min and 60 min  Short Term Goals: Week 1:  PT Short Term Goal 1 (Week 1): Pt will complete least restrictive transfers with min A consistently PT Short Term Goal 2 (Week 1): Pt will perform bed mobility at Supervision level consistently while adhering to spinal precautions PT Short Term Goal 3 (Week 1): Pt will ambulate x 100 ft with LRAD and CGA PT Short Term Goal 4 (Week 1): Pt will initiate stair training  Skilled Therapeutic Interventions/Progress Updates:    Session 1: Pt received seated in bed, agreeable to PT session. Pt reports ongoing pain in L lower back, MD aware and to perform trigger point injections 11/22. Supine to sit with CGA with HOB slightly elevated and use of bedrail. Assisted pt with donning shorts, knee-high TEDs, socks, L ankle brace, and shoes while seated EOB. Pt is setup A to don shirt. Sit to stand with min A to RW during session, pt reports LE feel "heavy" today. Ambulation x 10 ft with RW and CGA, flexed trunk posture during gait. Pt is setup A for washing face and oral hygiene while seated in w/c at sink. Ambulation x 65 ft, x 55 ft with RW and close Supervision for balance, flexed trunk posture, heavy UE reliance on RW, and decreased hip/knee flexion noted bilaterally. Pt has onset of L lower back trigger point pain during ambulation limiting tolerance this date. Pt reports that pain "lingers" even with seated rest break. Seated BLE heel slides x 10 reps each with AAROM needed from long-sitting position in recliner. Provided ice pack for pain management. Pt left seated in recliner in room with needs in reach at end of session.  Session 2: Pt received seated in recliner in room, agreeable to PT session. Pt reports trigger point pain in L lower back  continues to bother him. Sit to stand with min A to RW during session. Short distance ambulation in room with RW and CGA before severe increase in pain and need to sit. Dependent transport to/from therapy gym via w/c for time and energy conservation. Stand pivot transfer to mat table with RW and CGA. Sit to supine mod A needed for BLE management. Supine to sidelying with min A for LLE management. Performed trigger point release and soft tissue massage to region of L lower back at several sites of trigger points. Pt reports some improvement in pain following manual therapy. Also recommended use of heat vs ice for pain management and muscle relaxation. Semi-reclined on wedge on therapy mat performing AAROM heel slides, hip abd, and quad sets x 10 reps bilaterally. Pt requires significant assist for heel slides and some assist for hip abd due to LE weakness. Pt returned to recliner at end of session, needs in reach, hot pack to L lower back for pain management.  Therapy Documentation Precautions:  Precautions Precautions: Back Precaution Comments: 3/3 recalled Required Braces or Orthoses: Other Brace (L ankle) Spinal Brace Comments: No brace per MD note Other Brace: Utilized lace up ankle brace today that pt brought from home Restrictions Weight Bearing Restrictions: No     Therapy/Group: Individual Therapy   Excell Seltzer, PT, DPT, CSRS  08/28/2021, 12:00 PM

## 2021-08-29 LAB — GLUCOSE, CAPILLARY
Glucose-Capillary: 94 mg/dL (ref 70–99)
Glucose-Capillary: 113 mg/dL — ABNORMAL HIGH (ref 70–99)

## 2021-08-29 MED ORDER — DIAZEPAM 2 MG PO TABS
2.0000 mg | ORAL_TABLET | Freq: Every morning | ORAL | Status: DC
  Administered 2021-08-29 – 2021-09-06 (×9): 2 mg via ORAL
  Filled 2021-08-29 (×9): qty 1

## 2021-08-29 NOTE — Progress Notes (Signed)
Occupational Therapy Session Note  Patient Details  Name: Keith [name] MRN: [id number] Date of Birth: 07/28/1951  Today's Date: 08/29/2021 OT Individual Time: 1400-1445 OT Individual Time Calculation (min): 45 min    Short Term Goals: Week 2:  OT Short Term Goal 1 (Week 2): STGs = LTGs 2/2 LOS  Skilled Therapeutic Interventions/Progress Updates:  Pt greeted  seated in recliner    agreeable to OT intervention.pt reports increased pain today d/t trigger point on pts L lower back. Session focus on                                        BADL reeducation, BUE strength and endurance and functional mobility. Pt completed sit<>stand from recliner with Rw and CGA, ambulatory transfer from recliner>sink with CGA. Opted for seated grooming tasks d/t pain. Pt completed shaving from sitting in recliner with supervision. Pt completed w/c propulsion >137ft to promote improved BUE strength and endurance in addition to providing change of scenery as pt greatly impacted by increased pain this session. HR 84 bpm SpO2 97% post activity. Pt completed ~ 10 ft of functional mobility from doorway to recliner with rw and CGA. Pt left seated in recliner with all needs within reach.   Therapy Documentation Precautions:  Precautions Precautions: Back Precaution Comments: 3/3 recalled Required Braces or Orthoses: Other Brace (L ankle) Spinal Brace Comments: No brace per MD note Other Brace: Utilized lace up ankle brace today that pt brought from home Restrictions Weight Bearing Restrictions: No  Pain: unrated pain in back, provided therapeutic listening, change of scenery rest breaks and distraction as pain mgmt strategy.      Therapy/Group: Individual Therapy  Precious Haws 08/29/2021, 3:47 PM

## 2021-08-29 NOTE — Progress Notes (Signed)
Physical Therapy Weekly Progress Note  Patient Details  Name: Keith [name] MRN: [id number] Date of Birth: [birth year]  Beginning of progress report period: August 22, 2021 End of progress report period: August 29, 2021  Today's Date: 08/29/2021 PT Individual Time: 0800-0910 PT Individual Time Calculation (min): 70 min   Patient has met 1 of 4 short term goals.  Pt is making slow progress towards therapy goals and remains very limited at this time by pain from trigger points in his L lower back region. Pt reports he has experienced this pain for up to 4 years but it has gotten worse in the past year and during his rehab stay it has limited his ability to functionally participate in mobility training regarding gait and stairs. Pt exhibits great motivation and willingness to participate but is very limited by his pain at this time. Therapy sessions have focused on pain management and treating his trigger points. He is currently min A overall for bed mobility, min A for transfers with RW, CGA for gait up to 92 ft, Supervision for w/c mobility, and has only been able to navigate one 3" step with 2 handrails and mod A. Pt has several stairs to enter his home and his bedroom/bathroom are up a flight of stairs on the 2nd level of his home so he will need to be able to navigate stairs safely prior to d/c home. Pt's LOS has been extended from original date to allow time to continue to work towards safely navigating stairs.  Patient continues to demonstrate the following deficits muscle weakness and muscle joint tightness, decreased cardiorespiratoy endurance, and decreased standing balance, decreased postural control, decreased balance strategies, and pain  and therefore will continue to benefit from skilled PT intervention to increase functional independence with mobility.  Patient progressing toward long term goals..  Continue plan of care.  PT Short Term Goals Week 1:  PT Short Term Goal 1 (Week  1): Pt will complete least restrictive transfers with min A consistently PT Short Term Goal 1 - Progress (Week 1): Met PT Short Term Goal 2 (Week 1): Pt will perform bed mobility at Supervision level consistently while adhering to spinal precautions PT Short Term Goal 2 - Progress (Week 1): Progressing toward goal PT Short Term Goal 3 (Week 1): Pt will ambulate x 100 ft with LRAD and CGA PT Short Term Goal 3 - Progress (Week 1): Progressing toward goal PT Short Term Goal 4 (Week 1): Pt will initiate stair training PT Short Term Goal 4 - Progress (Week 1): Progressing toward goal Week 2:  PT Short Term Goal 1 (Week 2): =LTG due to ELOS  Skilled Therapeutic Interventions/Progress Updates:  Pt received seated in bed, agreeable to PT session. No complaints of pain at rest, pt reports he slept will. Supine to sit with Supervision with HOB slightly elevated and use of bedrail. Assisted pt with donning pants, knee-high TEDs, socks, L ankle brace, and shoes while seated EOB. Pt is setup A to doff hospital gown and don shirt while seated EOB. Sit to stand with min A to RW throughout session. Ambulatory transfer to sink with RW and CGA then able to perform oral hygiene and wash his face while seated in w/c with setup A. Manual w/c propulsion 2 x 200 ft with use of BUE at Supervision level including navigating on/off elevator with min cueing for safety. Ambulation x 80 ft with RW and CGA for balance, ongoing heavy BUE support and elevated shoulders, decreased hip  and knee flexion during gait. Pt only able to tolerate ambulating 80 ft before onset of pain from trigger points in L lower back region that do no improve with seated rest break. Pt transitions to sidelying on mat table with min A. Performed trigger point release and soft tissue massage to several deep trigger points in L lower back region. Pt reports improvement in symptoms following manual therapy. Standing with RW with min A with focus on decreased UE  support in standing. Pt continues to exhibit B shoulder elevation but is able to loosen grip with hands on RW. Ambulation x 80 ft with RW and CGA. Pt reports decrease in pain overall with second round of gait. Pt declines any heat or ice at end of session for pain management, does request Valium from nursing. Pt left seated in recliner in room with needs in reach at end of session.  Therapy Documentation Precautions:  Precautions Precautions: Back Precaution Comments: 3/3 recalled Required Braces or Orthoses: Other Brace (L ankle) Spinal Brace Comments: No brace per MD note Other Brace: Utilized lace up ankle brace today that pt brought from home Restrictions Weight Bearing Restrictions: No    Therapy/Group: Individual Therapy   Excell Seltzer, PT, DPT, CSRS 08/29/2021, 10:49 AM

## 2021-08-29 NOTE — Progress Notes (Signed)
PROGRESS NOTE   Subjective/Complaints:  Pt reports rested better- didn't actually have to use pain meds overnight- not til 6am or wake up overnight- pain was much better- Yesterday tough with therapy-  Also c/o thighs/hamstrings really tight.   RLE still weaker than LLE.   ROS:   Pt denies SOB, abd pain, CP, N/V/C/D, and vision changes    Objective:   No results found. Recent Labs    08/28/21 0557  WBC 5.7  HGB 10.2*  HCT 32.2*  PLT 313    Recent Labs    08/28/21 0557  NA 140  K 4.0  CL 108  CO2 25  GLUCOSE 116*  BUN 26*  CREATININE 1.50*  CALCIUM 9.3    Intake/Output Summary (Last 24 hours) at 08/29/2021 0956 Last data filed at 08/29/2021 0700 Gross per 24 hour  Intake 480 ml  Output 200 ml  Net 280 ml        Physical Exam: Vital Signs Blood pressure 110/75, pulse 72, temperature 97.7 F (36.5 C), temperature source Oral, resp. rate 16, height 6' (1.829 m), weight 125.8 kg, SpO2 98 %.     General: awake, alert, appropriate, sitting up in bed; looks better; NAD HENT: conjugate gaze; oropharynx moist CV: regular rate; no JVD Pulmonary: CTA B/L; no W/R/R- good air movement GI: soft, NT, ND, (+)BS Psychiatric: appropriate Neurological: [name] MS: TTP over L low back/palpable trigger point- less TTP today; hamstrings and anterior thighs somewhat tight R>L Motor: 5/5 in bilateral deltoid, bicep, tricep, grip Bilateral lower extremities: 2-hip flexor,3- knee extensors, 2- ankle dorsiflexor and plantar flexor, Slowly improving   Assessment/Plan: 1. Functional deficits which require 3+ hours per day of interdisciplinary therapy in a comprehensive inpatient rehab setting. Physiatrist is providing close team supervision and 24 hour management of active medical problems listed below. Physiatrist and rehab team continue to assess barriers to discharge/monitor patient progress toward functional and  medical goals  Care Tool:  Bathing    Body parts bathed by patient: Right arm, Left arm, Chest, Abdomen, Front perineal area, Buttocks, Right upper leg, Left upper leg, Right lower leg, Face   Body parts bathed by helper: Right lower leg, Left lower leg     Bathing assist Assist Level: Minimal Assistance - Patient > 75%     Upper Body Dressing/Undressing Upper body dressing   What is the patient wearing?: Hospital gown only    Upper body assist Assist Level: Supervision/Verbal cueing    Lower Body Dressing/Undressing Lower body dressing      What is the patient wearing?: Underwear/pull up, Pants     Lower body assist Assist for lower body dressing: Contact Guard/Touching assist Assistive Device Comment: walker   Toileting Toileting    Toileting assist Assist for toileting: Moderate Assistance - Patient 50 - 74% Assistive Device Comment: walker   Transfers Chair/bed transfer  Transfers assist     Chair/bed transfer assist level: Contact Guard/Touching assist     Locomotion Ambulation   Ambulation assist      Assist level: Contact Guard/Touching assist Assistive device: Walker-rolling Max distance: 63'   Walk 10 feet activity   Assist     Assist level:  Contact Guard/Touching assist Assistive device: Walker-rolling   Walk 50 feet activity   Assist Walk 50 feet with 2 turns activity did not occur: Safety/medical concerns  Assist level: Contact Guard/Touching assist Assistive device: Walker-rolling    Walk 150 feet activity   Assist Walk 150 feet activity did not occur: Safety/medical concerns         Walk 10 feet on uneven surface  activity   Assist Walk 10 feet on uneven surfaces activity did not occur: Safety/medical concerns         Wheelchair     Assist Is the patient using a wheelchair?: Yes (TBD) Type of Wheelchair: Manual    Wheelchair assist level: Supervision/Verbal cueing Max wheelchair distance: 100'     Wheelchair 50 feet with 2 turns activity    Assist        Assist Level: Supervision/Verbal cueing   Wheelchair 150 feet activity     Assist      Assist Level: Moderate Assistance - Patient 50 - 74%   Blood pressure 110/75, pulse 72, temperature 97.7 F (36.5 C), temperature source Oral, resp. rate 16, height 6' (1.829 m), weight 125.8 kg, SpO2 98 %.    Medical Problem List and Plan: 1.  Decline in self care and mobility  secondary to Thoracic myelopathy  Con't CIR- PT and OT- team conference today to f/u on progress 2.  Antithrombotics: -DVT/anticoagulation:  Pharmaceutical: Lovenox             -antiplatelet therapy: N/A 3. Chronic Pain Management:  Sees Dr. Glenford Bayley on oxycodone 5 mg qid, gabapentin and Cymbalta.   --Continue Oxycodone prn 11/16- skelaxin didn't work- d/ced-  Lidoderm patches 8pm to 8am and flexeril nightly 10 mg  trigger point injections   11/21- will d/c flexeril and add Valium 5 mg QHS- and do trigger point injections again [place] if need be.  11/22- Valium worked well- will schedule 2 mg in AM and con't 5 mg QHS- if need be, might need to do trigger point injections again, but will see how it goes.  4. Mood: LCSW to follow for evaluation and support.              -antipsychotic agents: N/A 5. Neuropsych: This patient is capable of making decisions on his own behalf. 6. Skin/Wound Care: Monitor wound for healing.  7. Fluids/Electrolytes/Nutrition: Monitor I/O.  8. T2DM with hyperglycemia: Hgb A1c-6.2 and BS controlled on low dose metformin --Monitor BS ac/hs and use SSI as needed.  11/22- off metformin due to rising Cr- Bgs up slightly, but will con't regimen with SSI 9. HTN; BP TID--on atenolol/hygroton and low dose catapres.   Controlled on 11/19 11. Neuropathy: continue gabapentin TID. 12. Dyslipidemia: ON Zocor 13. AKI and ABLA-     11/15- Hb stable at 10.3; Cr slightly better at 1.52  Creatinine 1.30 on 1/18, labs ordered for  Monday  11/21- Cr up to 1.5 and BUN 26- not drinking enough- push fluids- recheck Wednesday- might need IVFs again.   11/22- labs in AM  LOS: 8 days A FACE TO FACE EVALUATION WAS PERFORMED  Javien Tesch 08/29/2021, 9:56 AM

## 2021-08-29 NOTE — Progress Notes (Signed)
Physical Therapy Session Note  Patient Details  Name: Keith [name] MRN: [id number] Date of Birth: Jan 22, 1951  Today's Date: 08/29/2021 PT Individual Time: 1030-1111 PT Individual Time Calculation (min): 41 min   Short Term Goals: Week 1:  PT Short Term Goal 1 (Week 1): Pt will complete least restrictive transfers with min A consistently PT Short Term Goal 2 (Week 1): Pt will perform bed mobility at Supervision level consistently while adhering to spinal precautions PT Short Term Goal 3 (Week 1): Pt will ambulate x 100 ft with LRAD and CGA PT Short Term Goal 4 (Week 1): Pt will initiate stair training Week 2:     Skilled Therapeutic Interventions/Progress Updates:    Patient received sitting up in recliner, agreeable to PT. He reports pain in L flank ("trigger point") but did not rate. PT providing rest breaks, distractions, repositioning and MF release to assist in pain management. He reports that this L flank pain is the "most limiting" to his progress. He was able to transfer to wc with RW and CGA, but required up to [place] to stand from recliner due to feet being too far anterior. Patient propelling himself in wc to 4W therapy gym using B UE. He did require verbal cues for more efficient use of B UE strokes. Patient transferring to therapy mat with RW and MinA. Patient non-adherent to back precautions (log roll) when assuming supine despite multimodal cuing. PT providing MF release to L flank and gentle fascial massage to trigger point area with good results. PT educating patient on common causes and treatment timeline for trigger points and fascial adhesions. Patient receptive to education. Patient returning to room in wc, ambulatory transfer to recliner. Needs within reach.   Therapy Documentation Precautions:  Precautions Precautions: Back Precaution Comments: 3/3 recalled Required Braces or Orthoses: Other Brace (L ankle) Spinal Brace Comments: No brace per MD note Other Brace:  Utilized lace up ankle brace today that pt brought from home Restrictions Weight Bearing Restrictions: No   Therapy/Group: Individual Therapy  Karoline Caldwell, PT, DPT, CBIS  08/29/2021, 10:07 AM

## 2021-08-29 NOTE — Progress Notes (Signed)
Physical Therapy Session Note  Patient Details  Name: DEVEON KISIEL MRN: [id number] Date of Birth: 07/27/1951  Today's Date: 08/29/2021 PT Individual Time: 1140-1210 PT Individual Time Calculation (min): 30 min   Short Term Goals: Week 1:  PT Short Term Goal 1 (Week 1): Pt will complete least restrictive transfers with min A consistently PT Short Term Goal 1 - Progress (Week 1): Met PT Short Term Goal 2 (Week 1): Pt will perform bed mobility at Supervision level consistently while adhering to spinal precautions PT Short Term Goal 2 - Progress (Week 1): Progressing toward goal PT Short Term Goal 3 (Week 1): Pt will ambulate x 100 ft with LRAD and CGA PT Short Term Goal 3 - Progress (Week 1): Progressing toward goal PT Short Term Goal 4 (Week 1): Pt will initiate stair training PT Short Term Goal 4 - Progress (Week 1): Progressing toward goal Week 2:  PT Short Term Goal 1 (Week 2): =LTG due to ELOS Week 3:     Skilled Therapeutic Interventions/Progress Updates:  Ambulation/gait training;Balance/vestibular training;Discharge planning;Disease management/prevention;DME/adaptive equipment instruction;Functional electrical stimulation;Functional mobility training;Neuromuscular re-education;Pain management;Patient/family education;Splinting/orthotics;Stair training;Therapeutic Activities;Therapeutic Exercise;UE/LE Strength taining/ROM;UE/LE Coordination activities;Wheelchair propulsion/positioningPt initially oob in recliner, agreeable to session but reports pain 6/10 at L lower trap/lats region. Discussed "trigger point" and anatomy in response to pt question regarding this.  Pt also asked if he could use his "TENS" unit which he has at home.  Suggested he have it brought in and have PT review this unit and settings to determine if it would be appropriate.  Sit to stand from recliner w/mod assist, takes 2 attempts to achieve upright.  Stood x 30 sec and returned to sitting d/t increased pain.   Nursing in tp provide meds. Seated therex: LAQx w/3 sec hold at end range  Hamstring curls w/towel under foot to decrease friction Isometric hip abd w/manual resistance x  Hip abd/add w/foot on towel Seated heel raises Seated toe raises Pt left oob in recliner w/chair alarm set and needs in reach.  Therapy Documentation Precautions:  Precautions Precautions: Back Precaution Comments: 3/3 recalled Required Braces or Orthoses: Other Brace (L ankle) Spinal Brace Comments: No brace per MD note Other Brace: Utilized lace up ankle brace today that pt brought from home Restrictions Weight Bearing Restrictions: No        Therapy/Group: Individual Therapy Callie Fielding, [place] 08/29/2021, 12:46 PM

## 2021-08-29 NOTE — Progress Notes (Signed)
Patient ID: Keith [name], male   DOB: [birth year], 70 y.o.   MRN: [id number]   SW met with pt in room to provide updates from team conference, and d/c date change from 11/26 to 11/30 due to a goal that needs to be met which includes practicing steps. Pt amenable to staying. Pt encouraged to drink fluids. Pt aware SW will follow-up with his wife to provide updates, and asking if she is able to stay until 11:30 for fam edu tomorrow.   SW updated Amy/[facility] on pt change in d/c date.   *SW spoke with pt wife Keith [name] [phone]) to provide updates on above. She is willing to stay until 11:30am to meet with nursing for family edu tomorrow. Also reported she appreciates what all staff are doing to try and help her husband.   Loralee Pacas, MSW, [place] Office: [phone] Cell: [phone] Fax: [phone]

## 2021-08-29 NOTE — Patient Care Conference (Signed)
Inpatient RehabilitationTeam Conference and Plan of Care Update Date: 08/29/2021   Time: 11:10 AM    Patient Name: Keith [name]      Medical Record Number: [id number]  Date of Birth: January 16, 1951 Sex: Male         Room/Bed: 5C18C/5C18C-01 Payor Info: Payor: [employer] / Plan: [employer] MEDICARE / Product Type: *No Product type* /    Admit Date/Time:  08/21/2021  4:09 PM  Primary Diagnosis:  Thoracic myelopathy  Hospital Problems: Principal Problem:   Thoracic myelopathy Active Problems:   Controlled type 2 diabetes mellitus with hyperglycemia, without long-term current use of insulin [facility])    Expected Discharge Date: Expected Discharge Date: 09/06/21  Team Members Present: Physician leading conference: Dr. Courtney Heys Social Worker Present: Loralee Pacas, [place] Nurse Present: Dorthula Nettles, RN PT Present: Excell Seltzer, PT OT Present: Lillia Corporal, OT PPS Coordinator present : Gunnar Fusi, SLP     Current Status/Progress Goal Weekly Team Focus  Bowel/Bladder   continent to bowel and bladder LBM11/20  continent to b/b      Swallow/Nutrition/ Hydration             ADL's   Mod toileting, Min sit <> stands, CGA LB dress, Min LB bathe 2/2 back precautions, Min ADL transfers, pain in L lower back limiting  Supervision overall  transitional movement, standing balance/tolerance, BADL retraining, AE training, global endurance   Mobility   mod A bed mobility, min A sit to stand, CGA gait up to 92 ft pending pain, one 3" step mod A, Supervision w/c mobility  Supervision at ambulatory level, min A stairs  pain management, LE strengthening, gait, stairs   Communication             Safety/Cognition/ Behavioral Observations            Pain   back pain controlled on current regimen requires prns meds Q4 hrs pain c/o pain 5/10  pain level <5 on 0-10 scale      Skin   surigical incision closed, no s/s of infection pt request wound  to be covered for comfort   no s/s of infection        Discharge Planning:  Pt to d/c to home with his wife. Fam edu on 11/23 9am-11am. Pt has [facility] for HHPT/OT.   Team Discussion: Having muscle spasms in lower back. Gave Valium 5 mg last night, reports slept well. Ordered Valium 2mg  during day. Not drinking enough and need to push fluids. Has chronic pain. Wife will assist at discharge. [place] agency set up. Therapy doing myofascial pain relief. Needs to complete stairs. Patient on target to meet rehab goals: no, not progressing. Min assist currently. Supervision goals. Min assist for stairs.   *See Care Plan and progress notes for long and short-term goals.   Revisions to Treatment Plan:  Monitor labs, possible IVF  Teaching Needs: Family education, medication/pain management, safety awareness, transfer training, etc.  Current Barriers to Discharge: Decreased caregiver support, Home enviroment access/layout, Lack of/limited family support, Weight, Medication compliance, and pain, stairs.  Possible Resolutions to Barriers: Family education Follow up PT/OT Recommended DME Trigger point injections Myofascial release Tennis balls     Medical Summary Current Status: pain is boggest limiter/barrier- therapy also doing myofascial pain- had 4 years- worse now- cannot do stairs now and needs to- not progressing- also cr rising- needs IVFs? tomorrow- labs in AM.  Barriers to Discharge: Decreased family/caregiver support;Home enviroment access/layout;Weight;Weight bearing restrictions;Medical stability;Wound care;Other (  comments)  Barriers to Discharge Comments: lives on 2nd floor of house- 1/2 bath first floor- wife to help when home; pain limiting Possible Resolutions to [employer] Focus: therapy wanting to extend him due to lack of progress- d/c 12/30- pain -will try trigger point injections- later this week- goals supervision   Continued Need for Acute Rehabilitation Level of Care: The patient requires daily  medical management by a physician with specialized training in physical medicine and rehabilitation for the following reasons: Direction of a multidisciplinary physical rehabilitation program to maximize functional independence : Yes Medical management of patient stability for increased activity during participation in an intensive rehabilitation regime.: Yes Analysis of laboratory values and/or radiology reports with any subsequent need for medication adjustment and/or medical intervention. : Yes   I attest that I was present, lead the team conference, and concur with the assessment and plan of the team.   Cristi Loron 08/29/2021, 3:38 PM

## 2021-08-30 LAB — BASIC METABOLIC PANEL
Anion gap: 6 (ref 5–15)
BUN: 21 mg/dL (ref 8–23)
CO2: 25 mmol/L (ref 22–32)
Calcium: 9.2 mg/dL (ref 8.9–10.3)
Chloride: 107 mmol/L (ref 98–111)
Creatinine, Ser: 1.32 mg/dL — ABNORMAL HIGH (ref 0.61–1.24)
GFR, Estimated: 58 mL/min — ABNORMAL LOW (ref >60)
Glucose, Bld: 135 mg/dL — ABNORMAL HIGH (ref 70–99)
Potassium: 3.8 mmol/L (ref 3.5–5.1)
Sodium: 138 mmol/L (ref 135–145)

## 2021-08-30 LAB — GLUCOSE, CAPILLARY
Glucose-Capillary: 128 mg/dL — ABNORMAL HIGH (ref 70–99)
Glucose-Capillary: 127 mg/dL — ABNORMAL HIGH (ref 70–99)
Glucose-Capillary: 114 mg/dL — ABNORMAL HIGH (ref 70–99)

## 2021-08-30 MED ORDER — LEVETIRACETAM 250 MG PO TABS
250.0000 mg | ORAL_TABLET | Freq: Two times a day (BID) | ORAL | Status: DC
  Administered 2021-08-30 – 2021-09-06 (×15): 250 mg via ORAL
  Filled 2021-08-30 (×14): qty 1

## 2021-08-30 NOTE — Progress Notes (Signed)
Physical Therapy Session Note  Patient Details  Name: COLESON KANT MRN: [id number] Date of Birth: 1951-07-15  Today's Date: 08/30/2021 PT Individual Time: 1000-1100' 1400-1510 PT Individual Time Calculation (min): 60 min and 70 min  Short Term Goals: Week 2:  PT Short Term Goal 1 (Week 2): =LTG due to ELOS  Skilled Therapeutic Interventions/Progress Updates:    Session 1: Pt received seated in w/c in room with wife present for hands-on family education session. Pt reports minimal pain in L flank at rest, increases with mobility and onset of sharp pain from trigger points. Sit to stand with min A to RW during session. Ambulation x 80 ft, x 50 ft with RW and CGA for balance before onset of pain requiring seated rest break. [name] able to perform return demonstration of assisting him with sit to stand transfers and gait. Pt unable to complete car transfer or attempt stairs this date due to pain, his wife to attempt to return prior to d/c for education on these tasks. Pt returned to recliner in room due to pain. Long-sitting BLE heel slides with AAROM x 10 reps each. Discussed use of tennis balls and/or theracane for trigger point release as well. [name] to bring in tennis balls. Demonstrated and had pt perform return demo of use of theracane on trigger points. Pt also had his wife bring in TENs unit from home, per MD ok to utilize TENs for pain management. Assisted pt with setting up TENs unit with electrodes at site of pain in L flank region. Pt left seated in recliner in room with needs in reach, wife present, TENs unit in place for pain management.  Session 2: Pt received seated in recliner in room, agreeable to PT session. Minimal complaints of pain at rest, increase in L flank pain with mobility. Sit to stand with min A to RW during session, cues for bringing LE under body and anterior weight shift with transfer. Manual w/c propulsion x 200 ft with use of BUE at Supervision level.  Ascend/descend 8 x 3" stairs with 2 handrails and min A, ascending with RLE first. Pt exhibits circumduction of RLE when ascending. Standing alt L/R 3" step-ups with 2 handrails and min A for balance, focus on increasing hip/knee flexion. Pt exhibits increased difficulty when ascending with LLE as compared to RLE and overall increased difficulty with onset of fatigue. Overall pt exhibits improved ability to navigate stairs this date. Ambulation x 90 ft with RW and CGA with minimal increase in L flank pain during gait. Nustep level 4 x 10 min with use of B UE/LE for global endurance training. Pt returned to sitting in recliner in room with needs in reach at end of session. Pt exhibited improved pain control during this PM session and was able to achieve more functionally as a result.  Therapy Documentation Precautions:  Precautions Precautions: Back Precaution Comments: 3/3 recalled Required Braces or Orthoses: Other Brace (L ankle) Spinal Brace Comments: No brace per MD note Other Brace: Utilized lace up ankle brace today that pt brought from home Restrictions Weight Bearing Restrictions: No     Therapy/Group: Individual Therapy   Excell Seltzer, PT, DPT, CSRS  08/30/2021, 12:06 PM

## 2021-08-30 NOTE — Progress Notes (Signed)
Occupational Therapy Session Note  Patient Details  Name: Keith [name] MRN: [id number] Date of Birth: 12/02/1950  Today's Date: 08/30/2021 OT Individual Time: 3276-1470 OT Individual Time Calculation (min): 58 min    Short Term Goals: Week 1:  OT Short Term Goal 1 (Week 1): Pt will perform BSC/toilet transfers with CGA and LRAD OT Short Term Goal 1 - Progress (Week 1): Progressing toward goal OT Short Term Goal 2 (Week 1): Pt will don LB clothing with AE PRN with CGA OT Short Term Goal 2 - Progress (Week 1): Progressing toward goal OT Short Term Goal 3 (Week 1): Pt will perform shower level bathing with CGA OT Short Term Goal 3 - Progress (Week 1): Progressing toward goal Week 2:  OT Short Term Goal 1 (Week 2): STGs = LTGs 2/2 LOS   Skilled Therapeutic Interventions/Progress Updates:    Pt greeted at time of session in bed resting agreeable to OT session and aware of family ed, wife Altha Harm present a few minutes later and remained throughout. Pt states pain has been ongoing, medical team addressing nerve vs muscle pain, did not rate throughout session but still able to participate. Donned TEDS, socks, L ankle brace, and shoes for the pt prior to mobility 2/2 time constraints. Supine > sit Supervision with bed features, ambulated to bathrooom CGA with RW and transferred to commode same manner. Pt able to perform hygiene seated Supervision and clothing management standing CGA before walking short distance to wheelchair. Self propel to sink and set up with wife to assist with ADL, bathing and dressing. Pt able to direct care with wife proving Min assist throughout. Educated throughout session on energy conservation, back precautions during ADL tasks. Pt up in wheelchair at end of session with call bell in reach all needs met.  Therapy Documentation Precautions:  Precautions Precautions: Back Precaution Comments: 3/3 recalled Required Braces or Orthoses: Other Brace (L ankle) Spinal  Brace Comments: No brace per MD note Other Brace: Utilized lace up ankle brace today that pt brought from home Restrictions Weight Bearing Restrictions: No    Therapy/Group: Individual Therapy  Viona Gilmore 08/30/2021, 7:19 AM

## 2021-08-30 NOTE — Progress Notes (Signed)
PROGRESS NOTE   Subjective/Complaints:  Pt reports no improvement with Valium 2 mg in AM yesterday- wants to wait to increase that dose- we discussed since has had for 4-5+ years, just worse now, that could also be partially nerve pain- will add keppra - discussed options for nerve pain- already on Duloxetine and Gabapentin.   Said pain 'grabs' him when he's walking or even sitting- coming more frequently and longer period of time now.  Massage of the area eased it off slightly, but not much.    ROS:   Pt denies SOB, abd pain, CP, N/V/C/D, and vision changes    Objective:   No results found. Recent Labs    08/28/21 0557  WBC 5.7  HGB 10.2*  HCT 32.2*  PLT 313    Recent Labs    08/28/21 0557 08/30/21 0451  NA 140 138  K 4.0 3.8  CL 108 107  CO2 25 25  GLUCOSE 116* 135*  BUN 26* 21  CREATININE 1.50* 1.32*  CALCIUM 9.3 9.2    Intake/Output Summary (Last 24 hours) at 08/30/2021 0826 Last data filed at 08/30/2021 0700 Gross per 24 hour  Intake 540 ml  Output 1400 ml  Net -860 ml        Physical Exam: Vital Signs Blood pressure 114/66, pulse 79, temperature 98.3 F (36.8 C), temperature source Oral, resp. rate 18, height 6' (1.829 m), weight 125.8 kg, SpO2 99 %.      General: awake, alert, appropriate, sitting up in bed; watching TV; NAD HENT: conjugate gaze; oropharynx moist CV: regular rate; no JVD Pulmonary: CTA B/L; no W/R/R- good air movement GI: soft, NT, ND, (+)BS Psychiatric: appropriate; interactive, but a little flat/depressed Neurological: [name] Skin- no tenting MS: TTP over L low back/palpable trigger point- less TTP today; hamstrings and anterior thighs somewhat tight R>L Motor: 5/5 in bilateral deltoid, bicep, tricep, grip Bilateral lower extremities: 2-hip flexor,3- knee extensors, 2- ankle dorsiflexor and plantar flexor, Slowly improving   Assessment/Plan: 1. Functional deficits  which require 3+ hours per day of interdisciplinary therapy in a comprehensive inpatient rehab setting. Physiatrist is providing close team supervision and 24 hour management of active medical problems listed below. Physiatrist and rehab team continue to assess barriers to discharge/monitor patient progress toward functional and medical goals  Care Tool:  Bathing    Body parts bathed by patient: Right arm, Left arm, Chest, Abdomen, Front perineal area, Buttocks, Right upper leg, Left upper leg, Right lower leg, Face   Body parts bathed by helper: Right lower leg, Left lower leg     Bathing assist Assist Level: Minimal Assistance - Patient > 75%     Upper Body Dressing/Undressing Upper body dressing   What is the patient wearing?: Hospital gown only    Upper body assist Assist Level: Supervision/Verbal cueing    Lower Body Dressing/Undressing Lower body dressing      What is the patient wearing?: Underwear/pull up, Pants     Lower body assist Assist for lower body dressing: Contact Guard/Touching assist Assistive Device Comment: walker   Toileting Toileting    Toileting assist Assist for toileting: Moderate Assistance - Patient 50 - 74% Assistive  Device Comment: walker   Transfers Chair/bed transfer  Transfers assist     Chair/bed transfer assist level: Contact Guard/Touching assist     Locomotion Ambulation   Ambulation assist      Assist level: Contact Guard/Touching assist Assistive device: Walker-rolling Max distance: 80'   Walk 10 feet activity   Assist     Assist level: Contact Guard/Touching assist Assistive device: Walker-rolling   Walk 50 feet activity   Assist Walk 50 feet with 2 turns activity did not occur: Safety/medical concerns  Assist level: Contact Guard/Touching assist Assistive device: Walker-rolling    Walk 150 feet activity   Assist Walk 150 feet activity did not occur: Safety/medical concerns         Walk 10  feet on uneven surface  activity   Assist Walk 10 feet on uneven surfaces activity did not occur: Safety/medical concerns         Wheelchair     Assist Is the patient using a wheelchair?: Yes Type of Wheelchair: Manual    Wheelchair assist level: Supervision/Verbal cueing Max wheelchair distance: 200'    Wheelchair 50 feet with 2 turns activity    Assist        Assist Level: Supervision/Verbal cueing   Wheelchair 150 feet activity     Assist      Assist Level: Supervision/Verbal cueing   Blood pressure 114/66, pulse 79, temperature 98.3 F (36.8 C), temperature source Oral, resp. rate 18, height 6' (1.829 m), weight 125.8 kg, SpO2 99 %.    Medical Problem List and Plan: 1.  Decline in self care and mobility  secondary to Thoracic myelopathy  Con't PT and OT/CIR_ d/c 11/30 2.  Antithrombotics: -DVT/anticoagulation:  Pharmaceutical: Lovenox             -antiplatelet therapy: N/A 3. Chronic Pain Management:  Sees Dr. Glenford Bayley on oxycodone 5 mg qid, gabapentin and Cymbalta.   --Continue Oxycodone prn 11/16- skelaxin didn't work- d/ced-  Lidoderm patches 8pm to 8am and flexeril nightly 10 mg  trigger point injections   11/21- will d/c flexeril and add Valium 5 mg QHS- and do trigger point injections again [place] if need be.  11/22- Valium worked well- will schedule 2 mg in AM and con't 5 mg QHS- if need be, might need to do trigger point injections again, but will see how it goes. 11/23- will try to do TrP injections Friday and added Keppra 250 mg BID for nerve pain. Wait on increasing Valium.   4. Mood: LCSW to follow for evaluation and support.              -antipsychotic agents: N/A 5. Neuropsych: This patient is capable of making decisions on his own behalf. 6. Skin/Wound Care: Monitor wound for healing.  7. Fluids/Electrolytes/Nutrition: Monitor I/O.  8. T2DM with hyperglycemia: Hgb A1c-6.2 and BS controlled on low dose metformin --Monitor BS  ac/hs and use SSI as needed.  11/22- off metformin due to rising Cr- Bgs up slightly, but will con't regimen with SSI 9. HTN; BP TID--on atenolol/hygroton and low dose catapres.   Controlled on 11/19 11. Neuropathy: continue gabapentin TID. 12. Dyslipidemia: ON Zocor 13. AKI and ABLA-     11/15- Hb stable at 10.3; Cr slightly better at 1.52  Creatinine 1.30 on 1/18, labs ordered for Monday  11/21- Cr up to 1.5 and BUN 26- not drinking enough- push fluids- recheck Wednesday- might need IVFs again.   11/22- labs in AM  11/23- Cr down to  1.32 and BUN down to 21- will remove IV and recheck Monday.   LOS: 9 days A FACE TO FACE EVALUATION WAS PERFORMED  Orlandria Kissner 08/30/2021, 8:26 AM

## 2021-08-31 LAB — GLUCOSE, CAPILLARY
Glucose-Capillary: 130 mg/dL — ABNORMAL HIGH (ref 70–99)
Glucose-Capillary: 146 mg/dL — ABNORMAL HIGH (ref 70–99)
Glucose-Capillary: 135 mg/dL — ABNORMAL HIGH (ref 70–99)

## 2021-08-31 NOTE — Progress Notes (Signed)
PROGRESS NOTE   Subjective/Complaints:   Pt reports pain in L low back improved somewhat in 2nd therapy session yesterday.  No side effects from Keppra.  Did fairly well- did small stairs in therapy.  Walked multiple times.   ROS:   Pt denies SOB, abd pain, CP, N/V/C/D, and vision changes    Objective:   No results found. No results for input(s): WBC, HGB, HCT, PLT in the last 72 hours.   Recent Labs    08/30/21 0451  NA 138  K 3.8  CL 107  CO2 25  GLUCOSE 135*  BUN 21  CREATININE 1.32*  CALCIUM 9.2    Intake/Output Summary (Last 24 hours) at 08/31/2021 0751 Last data filed at 08/31/2021 0206 Gross per 24 hour  Intake 480 ml  Output 1025 ml  Net -545 ml        Physical Exam: Vital Signs Blood pressure 101/70, pulse 81, temperature 98.3 F (36.8 C), temperature source Oral, resp. rate 18, height 6' (1.829 m), weight 125.8 kg, SpO2 96 %.       General: awake, alert, appropriate, eating breakfast; sitting up in bed;  NAD HENT: conjugate gaze; oropharynx moist CV: regular rate; no JVD Pulmonary: CTA B/L; no W/R/R- good air movement GI: soft, NT, ND, (+)BS Psychiatric: appropriate- less flat Neurological: [name] Skin- no tenting MS: TTP over L low back/palpable trigger point- less TTP today; hamstrings and anterior thighs somewhat tight R>L Motor: 5/5 in bilateral deltoid, bicep, tricep, grip Bilateral lower extremities: 2-hip flexor,3- knee extensors, 2- ankle dorsiflexor and plantar flexor, Slowly improving   Assessment/Plan: 1. Functional deficits which require 3+ hours per day of interdisciplinary therapy in a comprehensive inpatient rehab setting. Physiatrist is providing close team supervision and 24 hour management of active medical problems listed below. Physiatrist and rehab team continue to assess barriers to discharge/monitor patient progress toward functional and medical goals  Care  Tool:  Bathing    Body parts bathed by patient: Right arm, Left arm, Chest, Abdomen, Front perineal area, Buttocks, Right upper leg, Left upper leg, Right lower leg, Face   Body parts bathed by helper: Right lower leg, Left lower leg     Bathing assist Assist Level: Minimal Assistance - Patient > 75%     Upper Body Dressing/Undressing Upper body dressing   What is the patient wearing?: Hospital gown only    Upper body assist Assist Level: Supervision/Verbal cueing    Lower Body Dressing/Undressing Lower body dressing      What is the patient wearing?: Underwear/pull up, Pants     Lower body assist Assist for lower body dressing: Contact Guard/Touching assist Assistive Device Comment: walker   Toileting Toileting    Toileting assist Assist for toileting: Moderate Assistance - Patient 50 - 74% Assistive Device Comment: walker   Transfers Chair/bed transfer  Transfers assist     Chair/bed transfer assist level: Contact Guard/Touching assist     Locomotion Ambulation   Ambulation assist      Assist level: Contact Guard/Touching assist Assistive device: Walker-rolling Max distance: 90'   Walk 10 feet activity   Assist     Assist level: Contact Guard/Touching assist Assistive device:  Walker-rolling   Walk 50 feet activity   Assist Walk 50 feet with 2 turns activity did not occur: Safety/medical concerns  Assist level: Contact Guard/Touching assist Assistive device: Walker-rolling    Walk 150 feet activity   Assist Walk 150 feet activity did not occur: Safety/medical concerns         Walk 10 feet on uneven surface  activity   Assist Walk 10 feet on uneven surfaces activity did not occur: Safety/medical concerns         Wheelchair     Assist Is the patient using a wheelchair?: Yes Type of Wheelchair: Manual    Wheelchair assist level: Supervision/Verbal cueing Max wheelchair distance: 200'    Wheelchair 50 feet with 2  turns activity    Assist        Assist Level: Supervision/Verbal cueing   Wheelchair 150 feet activity     Assist      Assist Level: Supervision/Verbal cueing   Blood pressure 101/70, pulse 81, temperature 98.3 F (36.8 C), temperature source Oral, resp. rate 18, height 6' (1.829 m), weight 125.8 kg, SpO2 96 %.    Medical Problem List and Plan: 1.  Decline in self care and mobility  secondary to Thoracic myelopathy  Con't PT/OT/CIR- d/c 11/30 2.  Antithrombotics: -DVT/anticoagulation:  Pharmaceutical: Lovenox             -antiplatelet therapy: N/A 3. Chronic Pain Management:  Sees Dr. Glenford Bayley on oxycodone 5 mg qid, gabapentin and Cymbalta.   --Continue Oxycodone prn 11/16- skelaxin didn't work- d/ced-  Lidoderm patches 8pm to 8am and flexeril nightly 10 mg  trigger point injections   11/21- will d/c flexeril and add Valium 5 mg QHS- and do trigger point injections again [place] if need be.  11/22- Valium worked well- will schedule 2 mg in AM and con't 5 mg QHS- if need be, might need to do trigger point injections again, but will see how it goes. 11/23- will try to do TrP injections Friday and added Keppra 250 mg BID for nerve pain. Wait on increasing Valium.  11/24- will do trigger point injections tomorrow  4. Mood: LCSW to follow for evaluation and support.              -antipsychotic agents: N/A 5. Neuropsych: This patient is capable of making decisions on his own behalf. 6. Skin/Wound Care: Monitor wound for healing.  7. Fluids/Electrolytes/Nutrition: Monitor I/O.  8. T2DM with hyperglycemia: Hgb A1c-6.2 and BS controlled on low dose metformin --Monitor BS ac/hs and use SSI as needed.  11/22- off metformin due to rising Cr- Bgs up slightly, but will con't regimen with SSI  11/24- BG's stable- con't regimen  9. HTN; BP TID--on atenolol/hygroton and low dose catapres.   11/24- BP controlled- con't regimen 11. Neuropathy: continue gabapentin TID. 12.  Dyslipidemia: ON Zocor 13. AKI and ABLA-     11/15- Hb stable at 10.3; Cr slightly better at 1.52  Creatinine 1.30 on 1/18, labs ordered for Monday  11/21- Cr up to 1.5 and BUN 26- not drinking enough- push fluids- recheck Wednesday- might need IVFs again.   11/22- labs in AM  11/23- Cr down to 1.32 and BUN down to 21- will remove IV and recheck Monday.   LOS: 10 days A FACE TO FACE EVALUATION WAS PERFORMED  Shayana Hornstein 08/31/2021, 7:51 AM

## 2021-09-01 LAB — GLUCOSE, CAPILLARY
Glucose-Capillary: 102 mg/dL — ABNORMAL HIGH (ref 70–99)
Glucose-Capillary: 93 mg/dL (ref 70–99)

## 2021-09-01 NOTE — Progress Notes (Signed)
PROGRESS NOTE   Subjective/Complaints:   Pt reports used theracane last night for 15-20 minutes- much more sore Had a bad night as a result.     ROS:   Pt denies SOB, abd pain, CP, N/V/C/D, and vision changes   Objective:   No results found. No results for input(s): WBC, HGB, HCT, PLT in the last 72 hours.   Recent Labs    08/30/21 0451  NA 138  K 3.8  CL 107  CO2 25  GLUCOSE 135*  BUN 21  CREATININE 1.32*  CALCIUM 9.2    Intake/Output Summary (Last 24 hours) at 09/01/2021 1502 Last data filed at 09/01/2021 1300 Gross per 24 hour  Intake 540 ml  Output 900 ml  Net -360 ml        Physical Exam: Vital Signs Blood pressure 104/64, pulse 84, temperature 98 F (36.7 C), resp. rate 16, height 6' (1.829 m), weight 125.8 kg, SpO2 98 %.        General: awake, alert, appropriate, sitting up slightly in bed- helped nurse pull him up in bed; NAD HENT: conjugate gaze; oropharynx moist CV: regular rate; no JVD Pulmonary: CTA B/L; no W/R/R- good air movement GI: soft, NT, ND, (+)BS Psychiatric: appropriate Neurological: [name]  Skin- no tenting MS: TTP over L low back/palpable trigger point- very tight/TTP ; hamstrings and anterior thighs somewhat tight R>L Motor: 5/5 in bilateral deltoid, bicep, tricep, grip Bilateral lower extremities: 2-hip flexor,3- knee extensors, 2- ankle dorsiflexor and plantar flexor, Slowly improving   Assessment/Plan: 1. Functional deficits which require 3+ hours per day of interdisciplinary therapy in a comprehensive inpatient rehab setting. Physiatrist is providing close team supervision and 24 hour management of active medical problems listed below. Physiatrist and rehab team continue to assess barriers to discharge/monitor patient progress toward functional and medical goals  Care Tool:  Bathing    Body parts bathed by patient: Right arm, Left arm, Chest, Abdomen, Front  perineal area, Buttocks, Right upper leg, Left upper leg, Right lower leg, Face   Body parts bathed by helper: Right lower leg, Left lower leg     Bathing assist Assist Level: Minimal Assistance - Patient > 75%     Upper Body Dressing/Undressing Upper body dressing   What is the patient wearing?: Hospital gown only    Upper body assist Assist Level: Supervision/Verbal cueing    Lower Body Dressing/Undressing Lower body dressing      What is the patient wearing?: Underwear/pull up, Pants     Lower body assist Assist for lower body dressing: Contact Guard/Touching assist Assistive Device Comment: walker   Toileting Toileting    Toileting assist Assist for toileting: Moderate Assistance - Patient 50 - 74% Assistive Device Comment: walker   Transfers Chair/bed transfer  Transfers assist     Chair/bed transfer assist level: Contact Guard/Touching assist     Locomotion Ambulation   Ambulation assist      Assist level: Contact Guard/Touching assist Assistive device: Walker-rolling Max distance: 90'   Walk 10 feet activity   Assist     Assist level: Contact Guard/Touching assist Assistive device: Walker-rolling   Walk 50 feet activity   Assist Walk  50 feet with 2 turns activity did not occur: Safety/medical concerns  Assist level: Contact Guard/Touching assist Assistive device: Walker-rolling    Walk 150 feet activity   Assist Walk 150 feet activity did not occur: Safety/medical concerns         Walk 10 feet on uneven surface  activity   Assist Walk 10 feet on uneven surfaces activity did not occur: Safety/medical concerns         Wheelchair     Assist Is the patient using a wheelchair?: Yes Type of Wheelchair: Manual    Wheelchair assist level: Supervision/Verbal cueing Max wheelchair distance: 200'    Wheelchair 50 feet with 2 turns activity    Assist        Assist Level: Supervision/Verbal cueing   Wheelchair  150 feet activity     Assist      Assist Level: Supervision/Verbal cueing   Blood pressure 104/64, pulse 84, temperature 98 F (36.7 C), resp. rate 16, height 6' (1.829 m), weight 125.8 kg, SpO2 98 %.    Medical Problem List and Plan: 1.  Decline in self care and mobility  secondary to Thoracic myelopathy  Con't CIR PT and OT_  d/c 11/30 2.  Antithrombotics: -DVT/anticoagulation:  Pharmaceutical: Llovenox             -antiplatelet therapy: N/A 3. Chronic Pain Management:  Sees Dr. Glenford [name] on oxycodone 5 mg qid, gabapentin and Cymbalta.   --Continue Oxycodone prn 11/16- skelaxin didn't work- d/ced-  Lidoderm patches 8pm to 8am and flexeril nightly 10 mg  trigger point injections   11/21- will d/c flexeril and add Valium 5 mg QHS- and do trigger point injections again Keith [name] if need be.  11/22- Valium worked well- will schedule 2 mg in AM and con't 5 mg QHS- if need be, might need to do trigger point injections again, but will see how it goes. 11/23- will try to do TrP injections Friday and added Keppra 250 mg BID for nerve pain. Wait on increasing Valium.  11/25- trigger point injections today- pt reports immediate relief.  4. Mood: LCSW to follow for evaluation and support.              -antipsychotic agents: N/A 5. Neuropsych: This patient is capable of making decisions on his own behalf. 6. Skin/Wound Care: Monitor wound for healing.  7. Fluids/Electrolytes/Nutrition: Monitor I/O.  8. T2DM with hyperglycemia: Hgb A1c-6.2 and BS controlled on low dose metformin --Monitor BS ac/hs and use SSI as needed.  11/22- off metformin due to rising Cr- Bgs up slightly, but will con't regimen with SSI 11/25- CBGs look good- con't regimen 9. HTN; BP TID--on atenolol/hygroton and low dose catapres.   11/24- BP controlled- con't regimen 11. Neuropathy: continue gabapentin TID. 12. Dyslipidemia: ON Zocor 13. AKI and ABLA-     11/15- Hb stable at 10.3; Cr slightly better at  1.52  Creatinine 1.30 on 1/18, labs ordered for Monday  11/21- Cr up to 1.5 and BUN 26- not drinking enough- push fluids- recheck Wednesday- might need IVFs again.   11/22- labs in AM  11/23- Cr down to 1.32 and BUN down to 21- will remove IV and recheck Monday.   Trigger point injections 09/01/21  Cleaned areas with alcohol and injected using a 27 gauge 1.5 inch needle  Injected 3cc Using 1% Lidocaine with no EPI  Upper traps Levators Posterior scalenes Middle scalenes Splenius Capitus Pectoralis Major Rhomboids Infraspinatus Teres Major/minor Thoracic paraspinals Lumbar paraspinals L low  back- 4 different spots- multiple twitch responses Other injections-    Patient's level of pain prior was "down 2 points after injections".  Current level of pain after injections is  There was no bleeding or complications.   I spent a total of 41 minutes on total care- >50% coordination of care- doing pt care and doing trigger point injections.    LOS: 11 days A FACE TO FACE EVALUATION WAS PERFORMED  Keith [name] 09/01/2021, 3:02 PM

## 2021-09-01 NOTE — Progress Notes (Signed)
Patient ID: Keith [name], male   DOB: January 04, 1951, 70 y.o.   MRN: [id number]  Per pt wife  Altha Harm reports, she was asked to come in for more family edu. Scheduled for Tuesday (11/29) 1pm-3pm.  Loralee Pacas, MSW, [place] Office: [phone] Cell: [phone] Fax: [phone]

## 2021-09-01 NOTE — Progress Notes (Signed)
Physical Therapy Session Note  Patient Details  Name: Keith [name] MRN: [id number] Date of Birth: April 22, 1951  Today's Date: 09/01/2021 PT Individual Time: 0263-7858; 1315-1430 PT Individual Time Calculation (min): 60 min and 75 min  Short Term Goals: Week 2:  PT Short Term Goal 1 (Week 2): =LTG due to ELOS  Skilled Therapeutic Interventions/Progress Updates:    Session 1: Pt received seated in bed, agreeable to PT session. Pt reports 3/10 pain in L flank at rest. Pt reports he is premedicated for pain and received trigger point injections from MD prior to session. Supine to sit with Supervision with increased time, HOB elevated and use of bedrail. Per pt report he does have adjustable bed at home, does not have bedrail. Recommend use of bedrail for increased independence with bed mobility. Assisted pt with donning shorts, TEDs, socks, L ankle brace, and shoes while seated EOB. Pt is setup A to doff gown and don shirt while seated EOB. Sit to stand with CGA to RW from elevated surface. Ambulatory transfer to sink with RW and CGA. Pt is setup A for oral hygiene and washing face while seated in w/c at sink. Manual w/c propulsion 2 x 200 ft at Supervision level with use of BUE. Sit to stand with min A to RW from w/c height to RW, cues for LE placement and anterior weight shift. Ambulation x 90 ft, x 50 ft with RW and CGA for balance, flexed trunk posture and decreased hip flex/knee flex during gait. Standing alt L/R 3" step-ups with 2 handrails and min A for balance, x 3 reps, x 4 reps, x 5 reps to fatigue. Pt exhibits ongoing RLE hip and knee weakness with circumduction noted with onset of fatigue. Pt reports LE feel "heavy" and like "lead" today. Pt has no increase in pain during session and pain appears to be well-controlled. Pt left seated in recliner in room with needs in reach at end of session.  Session 2: Pt received seated in recliner in room, agreeable to PT session. Minimal complaints of  pain in back at rest, does have increase in pain during session. Pt reports he has not taken pain medication since early AM, requesting pain medication from nursing at end of session. Sit to stand with min A to RW throughout session, CGA for stand pivot transfer and RW. Manual w/c propulsion up to 300 ft at Supervision level with use of BUE. Ambulation x 90 ft with RW and CGA for balance before onset of trigger point pain in L flank, pain improves with seated rest break. Attempt to lift either LE onto 6" step to progress towards stairs pt will need to navigate upon d/c home. Due to significant LE weakness pt unable to lift either LE to step. Placed gait belt around R foot and pt able to lift LE to step with assist to lift limb, however unable to lift LLE up onto step. Pt will need to continue to work on LE strengthening and towards being able to perform 6" steps for a safe d/c home. Also demonstrated how pt will perform stairs laterally with R handrail similar to home setup. Pt unable to lift LLE laterally onto step at this time. Attempt to have pt perform Kinetron for LE strengthening, pt unable to perform without significant effort due to LE fatigue and weakness this date. Seated UB strengthening task performing 5# weighted dowel volleyball x 30 reps to fatigue. Pt then requests to return upstairs to obtain pain medication and use  the bathroom. Nursing notified that pt requesting pain medication. Ambulatory transfer into bathroom with RW and CGA. Toilet transfer with min A. Pt requires assist for clothing management, setup A for pericare. Ambulatory transfer back to recliner with RW and CGA. Pt left seated in recliner in room with needs in reach at end of session.  Therapy Documentation Precautions:  Precautions Precautions: Back Precaution Comments: 3/3 recalled Required Braces or Orthoses: Other Brace (L ankle) Spinal Brace Comments: No brace per MD note Other Brace: Utilized lace up ankle brace today  that pt brought from home Restrictions Weight Bearing Restrictions: No   Therapy/Group: Individual Therapy   Excell Seltzer, PT, DPT, CSRS  09/01/2021, 12:43 PM

## 2021-09-02 LAB — GLUCOSE, CAPILLARY
Glucose-Capillary: 95 mg/dL (ref 70–99)
Glucose-Capillary: 141 mg/dL — ABNORMAL HIGH (ref 70–99)

## 2021-09-02 NOTE — Progress Notes (Signed)
PROGRESS NOTE   Subjective/Complaints: Had good benefit from trigger point injections yesterday Has no other complaints   ROS:   Pt denies SOB, abd pain, CP, N/V/C/D, and vision changes   Objective:   No results found. No results for input(s): WBC, HGB, HCT, PLT in the last 72 hours.   No results for input(s): NA, K, CL, CO2, GLUCOSE, BUN, CREATININE, CALCIUM in the last 72 hours.   Intake/Output Summary (Last 24 hours) at 09/02/2021 1747 Last data filed at 09/02/2021 1713 Gross per 24 hour  Intake 600 ml  Output 2075 ml  Net -1475 ml        Physical Exam: Vital Signs Blood pressure 109/71, pulse 74, temperature 98.3 F (36.8 C), resp. rate 18, height 6' (1.829 m), weight 125.8 kg, SpO2 100 %. Gen: no distress, normal appearing HEENT: oral mucosa pink and moist, NCAT Cardio: Reg rate Chest: normal effort, normal rate of breathing Abd: soft, non-distended Ext: no edema Psych: pleasant, normal affect  Skin- no tenting MS: TTP over L low back/palpable trigger point- very tight/TTP ; hamstrings and anterior thighs somewhat tight R>L Motor: 5/5 in bilateral deltoid, bicep, tricep, grip Bilateral lower extremities: 2-hip flexor,3- knee extensors, 2- ankle dorsiflexor and plantar flexor, Slowly improving   Assessment/Plan: 1. Functional deficits which require 3+ hours per day of interdisciplinary therapy in a comprehensive inpatient rehab setting. Physiatrist is providing close team supervision and 24 hour management of active medical problems listed below. Physiatrist and rehab team continue to assess barriers to discharge/monitor patient progress toward functional and medical goals  Care Tool:  Bathing    Body parts bathed by patient: Right arm, Left arm, Chest, Abdomen, Front perineal area, Buttocks, Right upper leg, Left upper leg, Right lower leg, Face   Body parts bathed by helper: Right lower leg,  Left lower leg     Bathing assist Assist Level: Minimal Assistance - Patient > 75%     Upper Body Dressing/Undressing Upper body dressing   What is the patient wearing?: Hospital gown only    Upper body assist Assist Level: Supervision/Verbal cueing    Lower Body Dressing/Undressing Lower body dressing      What is the patient wearing?: Underwear/pull up, Pants     Lower body assist Assist for lower body dressing: Contact Guard/Touching assist Assistive Device Comment: walker   Toileting Toileting    Toileting assist Assist for toileting: Moderate Assistance - Patient 50 - 74% Assistive Device Comment: walker   Transfers Chair/bed transfer  Transfers assist     Chair/bed transfer assist level: Contact Guard/Touching assist     Locomotion Ambulation   Ambulation assist      Assist level: Contact Guard/Touching assist Assistive device: Walker-rolling Max distance: 90'   Walk 10 feet activity   Assist     Assist level: Contact Guard/Touching assist Assistive device: Walker-rolling   Walk 50 feet activity   Assist Walk 50 feet with 2 turns activity did not occur: Safety/medical concerns  Assist level: Contact Guard/Touching assist Assistive device: Walker-rolling    Walk 150 feet activity   Assist Walk 150 feet activity did not occur: Safety/medical concerns  Walk 10 feet on uneven surface  activity   Assist Walk 10 feet on uneven surfaces activity did not occur: Safety/medical concerns         Wheelchair     Assist Is the patient using a wheelchair?: Yes Type of Wheelchair: Manual    Wheelchair assist level: Supervision/Verbal cueing Max wheelchair distance: 200'    Wheelchair 50 feet with 2 turns activity    Assist        Assist Level: Supervision/Verbal cueing   Wheelchair 150 feet activity     Assist      Assist Level: Supervision/Verbal cueing   Blood pressure 109/71, pulse 74,  temperature 98.3 F (36.8 C), resp. rate 18, height 6' (1.829 m), weight 125.8 kg, SpO2 100 %.    Medical Problem List and Plan: 1.  Decline in self care and mobility  secondary to Thoracic myelopathy  Continue CIR d/c 11/30 2.  Impaired mobility: Continue Lovenox             -antiplatelet therapy: N/A 3. Chronic Pain Management:  Sees Dr. Glenford Bayley on oxycodone 5 mg qid, gabapentin and Cymbalta.   -Continue Oxycodone prn 11/16- skelaxin didn't work- d/ced-  Lidoderm patches 8pm to 8am and flexeril nightly 10 mg  trigger point injections   11/21- will d/c flexeril and add Valium 5 mg QHS- and do trigger point injections again Tuesdya if need be.  11/22- Valium worked well- will schedule 2 mg in AM and con't 5 mg QHS- if need be, might need to do trigger point injections again, but will see how it goes. 11/23- will try to do TrP injections Friday and added Keppra 250 mg BID for nerve pain. Wait on increasing Valium.  11/25- trigger point injections today- pt reports immediate relief.  4. Mood: LCSW to follow for evaluation and support.              -antipsychotic agents: N/A 5. Neuropsych: This patient is capable of making decisions on his own behalf. 6. Skin/Wound Care: Monitor wound for healing.  7. Fluids/Electrolytes/Nutrition: Monitor I/O.  8. T2DM with hyperglycemia: Hgb A1c-6.2 and BS controlled on low dose metformin --Monitor BS ac/hs and use SSI as needed.  11/22- off metformin due to rising Cr- Bgs up slightly, but will con't regimen with SSI 11/25- CBGs look good- con't regimen 9. HTN; BP TID--on atenolol/hygroton and low dose catapres.   11/24- BP controlled- con't regimen 11. Neuropathy: Continue gabapentin TID. 12. Dyslipidemia: Continue Zocor 13. AKI and ABLA-     11/15- Hb stable at 10.3; Cr slightly better at 1.52  Creatinine 1.30 on 1/18, labs ordered for Monday  11/21- Cr up to 1.5 and BUN 26- not drinking enough- push fluids- recheck Wednesday- might need IVFs  again.   11/22- labs in AM  11/23- Cr down to 1.32 and BUN down to 21- will remove IV and recheck Monday.   T LOS: 12 days A FACE TO FACE EVALUATION WAS PERFORMED  Izora Ribas 09/02/2021, 5:47 PM

## 2021-09-03 LAB — GLUCOSE, CAPILLARY
Glucose-Capillary: 117 mg/dL — ABNORMAL HIGH (ref 70–99)
Glucose-Capillary: 111 mg/dL — ABNORMAL HIGH (ref 70–99)

## 2021-09-03 MED ORDER — SODIUM CHLORIDE 0.9 % IV SOLN: INTRAVENOUS | Status: DC

## 2021-09-03 NOTE — Progress Notes (Signed)
PROGRESS NOTE   Subjective/Complaints: Patient's chart reviewed- No issues reported overnight Vitals signs stable    ROS:   Pt denies SOB, abd pain, CP, N/V/C/D, and vision changes   Objective:   No results found. No results for input(s): WBC, HGB, HCT, PLT in the last 72 hours.   No results for input(s): NA, K, CL, CO2, GLUCOSE, BUN, CREATININE, CALCIUM in the last 72 hours.   Intake/Output Summary (Last 24 hours) at 09/03/2021 1048 Last data filed at 09/03/2021 0806 Gross per 24 hour  Intake 480 ml  Output 2300 ml  Net -1820 ml        Physical Exam: Vital Signs Blood pressure 110/68, pulse 87, temperature 98 F (36.7 C), temperature source Oral, resp. rate 18, height 6' (1.829 m), weight 125.8 kg, SpO2 95 %. Gen: no distress, normal appearing HEENT: oral mucosa pink and moist, NCAT Cardio: Reg rate Chest: normal effort, normal rate of breathing Abd: soft, non-distended Ext: no edema Psych: pleasant, normal affect  Skin- no tenting MS: TTP over L low back/palpable trigger point- very tight/TTP ; hamstrings and anterior thighs somewhat tight R>L Motor: 5/5 in bilateral deltoid, bicep, tricep, grip Bilateral lower extremities: 2-hip flexor,3- knee extensors, 2- ankle dorsiflexor and plantar flexor, Slowly improving   Assessment/Plan: 1. Functional deficits which require 3+ hours per day of interdisciplinary therapy in a comprehensive inpatient rehab setting. Physiatrist is providing close team supervision and 24 hour management of active medical problems listed below. Physiatrist and rehab team continue to assess barriers to discharge/monitor patient progress toward functional and medical goals  Care Tool:  Bathing    Body parts bathed by patient: Right arm, Left arm, Chest, Abdomen, Front perineal area, Buttocks, Right upper leg, Left upper leg, Right lower leg, Face   Body parts bathed by helper:  Right lower leg, Left lower leg     Bathing assist Assist Level: Minimal Assistance - Patient > 75%     Upper Body Dressing/Undressing Upper body dressing   What is the patient wearing?: Hospital gown only    Upper body assist Assist Level: Supervision/Verbal cueing    Lower Body Dressing/Undressing Lower body dressing      What is the patient wearing?: Underwear/pull up, Pants     Lower body assist Assist for lower body dressing: Contact Guard/Touching assist Assistive Device Comment: walker   Toileting Toileting    Toileting assist Assist for toileting: Moderate Assistance - Patient 50 - 74% Assistive Device Comment: walker   Transfers Chair/bed transfer  Transfers assist     Chair/bed transfer assist level: Contact Guard/Touching assist     Locomotion Ambulation   Ambulation assist      Assist level: Contact Guard/Touching assist Assistive device: Walker-rolling Max distance: 90'   Walk 10 feet activity   Assist     Assist level: Contact Guard/Touching assist Assistive device: Walker-rolling   Walk 50 feet activity   Assist Walk 50 feet with 2 turns activity did not occur: Safety/medical concerns  Assist level: Contact Guard/Touching assist Assistive device: Walker-rolling    Walk 150 feet activity   Assist Walk 150 feet activity did not occur: Safety/medical concerns  Walk 10 feet on uneven surface  activity   Assist Walk 10 feet on uneven surfaces activity did not occur: Safety/medical concerns         Wheelchair     Assist Is the patient using a wheelchair?: Yes Type of Wheelchair: Manual    Wheelchair assist level: Supervision/Verbal cueing Max wheelchair distance: 200'    Wheelchair 50 feet with 2 turns activity    Assist        Assist Level: Supervision/Verbal cueing   Wheelchair 150 feet activity     Assist      Assist Level: Supervision/Verbal cueing   Blood pressure 110/68,  pulse 87, temperature 98 F (36.7 C), temperature source Oral, resp. rate 18, height 6' (1.829 m), weight 125.8 kg, SpO2 95 %.    Medical Problem List and Plan: 1.  Decline in self care and mobility  secondary to Thoracic myelopathy  Continue CIR d/c 11/30 2.  Impaired mobility: Continue Lovenox             -antiplatelet therapy: N/A 3. Chronic Pain Management:  Sees Dr. Glenford Bayley on oxycodone 5 mg qid, gabapentin and Cymbalta.   -Continue Oxycodone prn 11/16- skelaxin didn't work- d/ced-  Lidoderm patches 8pm to 8am and flexeril nightly 10 mg  trigger point injections   11/21- will d/c flexeril and add Valium 5 mg QHS- and do trigger point injections again Tuesdya if need be.  11/22- Valium worked well- will schedule 2 mg in AM and con't 5 mg QHS- if need be, might need to do trigger point injections again, but will see how it goes. 11/23- will try to do TrP injections Friday and added Keppra 250 mg BID for nerve pain. Wait on increasing Valium.  11/25- trigger point injections today- pt reports immediate relief.  4. Mood: LCSW to follow for evaluation and support.              -antipsychotic agents: N/A 5. Neuropsych: This patient is capable of making decisions on his own behalf. 6. Skin/Wound Care: Monitor wound for healing.  7. Fluids/Electrolytes/Nutrition: Monitor I/O.  8. T2DM with hyperglycemia: Hgb A1c-6.2 and BS controlled on low dose metformin --Monitor BS ac/hs and use SSI as needed.  11/22- off metformin due to rising Cr- Bgs up slightly, but will con't regimen with SSI 11/25- CBGs look good- con't regimen 11/27 CBGs 93-146, provided with list of foods that are good for diabetes.  9. HTN; BP TID--on atenolol/hygroton and low dose catapres.   11/24- BP controlled- con't regimen 11. Neuropathy: Continue gabapentin TID. 12. Dyslipidemia: Continue Zocor 13. AKI and ABLA-     11/15- Hb stable at 10.3; Cr slightly better at 1.52  Creatinine 1.30 on 1/18, labs ordered for  Monday  11/21- Cr up to 1.5 and BUN 26- not drinking enough- push fluids- recheck Wednesday- might need IVFs again.   11/22- labs in AM  11/23- Cr down to 1.32 and BUN down to 21- will remove IV and recheck Monday.   T LOS: 13 days A FACE TO FACE EVALUATION WAS PERFORMED  Keith [name] 09/03/2021, 10:48 AM

## 2021-09-03 NOTE — Progress Notes (Signed)
Physical Therapy Session Note  Patient Details  Name: Keith [name] MRN: [id number] Date of Birth: 1950/11/02  Today's Date: 09/03/2021 PT Individual Time: 1400-1512 PT Individual Time Calculation (min): 72 min   Short Term Goals: Week 2:  PT Short Term Goal 1 (Week 2): =LTG due to ELOS  Skilled Therapeutic Interventions/Progress Updates:    Pt received seated in recliner in room, agreeable to PT session. No complaints of pain this date. Assisted pt with donning TED hose, socks, shoes, and L ankle brace dependently for time and energy conservation. Sit to stand with min A to RW during session, cues for LE placement and anterior weight shift with transfer. Stand pivot transfer to w/c with RW and CGA for balance. Manual w/c propulsion x 200 ft with use of BUE at Supervision level. Ambulation x 90 ft with RW and CGA for balance, flexed trunk and decreased hip/knee flexion during gait. Pt reports his LE continue to feel "heavy" with mobility. Standing RLE 3" step-ups 3 x 3 reps with BUE support and mod A for balance. Pt exhibits circumduction of limb due to hip and knee weakness. Discussed pt's ongoing difficulty navigating stairs and options at home including temporary ramp or family bumping pt up stairs via w/c. Pt reports he has been looking into get a ramp and will order one to be in place prior to d/c home. Also discussed pt living on main level of home until he is able to navigate flight of stairs up to his bedroom/bathroom. Nustep level 3, 2 x 5 min with use of B UE and LE for global endurance training. Pt returned to recliner at end of session, needs in reach. No issues with pain this date.  Therapy Documentation Precautions:  Precautions Precautions: Back Precaution Comments: 3/3 recalled Required Braces or Orthoses: Other Brace (L ankle) Spinal Brace Comments: No brace per MD note Other Brace: Utilized lace up ankle brace today that pt brought from home Restrictions Weight Bearing  Restrictions: No    Therapy/Group: Individual Therapy   Excell Seltzer, PT, DPT, CSRS  09/03/2021, 4:53 PM

## 2021-09-04 LAB — BASIC METABOLIC PANEL
Anion gap: 8 (ref 5–15)
BUN: 19 mg/dL (ref 8–23)
CO2: 25 mmol/L (ref 22–32)
Calcium: 9 mg/dL (ref 8.9–10.3)
Chloride: 106 mmol/L (ref 98–111)
Creatinine, Ser: 1.31 mg/dL — ABNORMAL HIGH (ref 0.61–1.24)
GFR, Estimated: 59 mL/min — ABNORMAL LOW (ref >60)
Glucose, Bld: 129 mg/dL — ABNORMAL HIGH (ref 70–99)
Potassium: 3.8 mmol/L (ref 3.5–5.1)
Sodium: 139 mmol/L (ref 135–145)

## 2021-09-04 LAB — GLUCOSE, CAPILLARY
Glucose-Capillary: 103 mg/dL — ABNORMAL HIGH (ref 70–99)
Glucose-Capillary: 98 mg/dL (ref 70–99)

## 2021-09-04 LAB — CBC
HCT: 30 % — ABNORMAL LOW (ref 39.0–52.0)
Hemoglobin: 9.8 g/dL — ABNORMAL LOW (ref 13.0–17.0)
MCH: 31.3 pg (ref 26.0–34.0)
MCHC: 32.7 g/dL (ref 30.0–36.0)
MCV: 95.8 fL (ref 80.0–100.0)
Platelets: 303 10*3/uL (ref 150–400)
RBC: 3.13 MIL/uL — ABNORMAL LOW (ref 4.22–5.81)
RDW: 15.5 % (ref 11.5–15.5)
WBC: 4.4 10*3/uL (ref 4.0–10.5)
nRBC: 0 % (ref 0.0–0.2)

## 2021-09-04 NOTE — Progress Notes (Signed)
Physical Therapy Session Note  Patient Details  Name: Keith [name] MRN: [id number] Date of Birth: 06-29-1951  Today's Date: 09/04/2021 PT Individual Time: 0800-0900 AND 1410-1453 PT Individual Time Calculation (min): 60 min AND 43 min Short Term Goals: Week 1:  PT Short Term Goal 1 (Week 1): Pt will complete least restrictive transfers with min A consistently PT Short Term Goal 1 - Progress (Week 1): Met PT Short Term Goal 2 (Week 1): Pt will perform bed mobility at Supervision level consistently while adhering to spinal precautions PT Short Term Goal 2 - Progress (Week 1): Progressing toward goal PT Short Term Goal 3 (Week 1): Pt will ambulate x 100 ft with LRAD and CGA PT Short Term Goal 3 - Progress (Week 1): Progressing toward goal PT Short Term Goal 4 (Week 1): Pt will initiate stair training PT Short Term Goal 4 - Progress (Week 1): Progressing toward goal Week 2:  PT Short Term Goal 1 (Week 2): =LTG due to ELOS Week 3:     Skilled Therapeutic Interventions/Progress Updates:  AM SESSION   Pt initially supine and requesting assistance w/am ADLs.   Supine to sit using bedrails and hob elevated w/supervision. stand pivot transfer to wc w/RW, min assist w/bed elevated but appears unstable w/transition to standing.  Lowers pants in standing prior to sitting, again appears unstable but no balance loss, relies heaviliy on walker. Uses reacher to remove pants and underwear. Pt propels to sink independently    Performs upper body bathing w/set up from wc level Sit to stand w/min assist to sink.  Performs perineal bathing in standing w/cga, set up. Returns to sitting to dry self, shave, brush teeth, grooms hair , applies deodorant mod I from wc level w/supervision only.  Therapist assists w/threading underwear and shorts, pt raises to hips from wc level.  Sit to stand w/min assist but heavy reliance on sink for stabilization.  Therapist assists/mod assistw/raising shorts in  standing. Pt propels to recliner mod I.  stand pivot transfer to recliner w/min assist but again, transition to standing is arduous and appears unstable, no LOB. In sitting, pt dons Ted hose using plastic bag w/additional time and set up. Therapist applies L ankle brace and shoes for time management. Pt left oob in recliner w/chair alarm set and needs in reach.   Pm SESSION Pt seen bedside for instruction w/and performance of HEP.  Provided w/instruction for accessing HEP via [place] website.  Provided w/yellow (light) theraband.  Accessed site and reviewed program following performance.   Access Code: [email or web address] URL: [email or web address] Date: 09/04/2021 Prepared by: Callie Fielding  Exercises Seated Hip Abduction with Resistance - 1 x daily - 7 x weekly - 3 sets - 10 reps Seated Long Arc Quad - 1 x daily - 7 x weekly - 3 sets - 10 reps Seated Hamstring Curl with Anchored Resistance - 1 x daily - 7 x weekly - 3 sets - 10 reps Seated Hip Adduction Isometrics with Ball - 1 x daily - 7 x weekly - 3 sets - 10 reps Sit to Stand with Counter Support - 1 x daily - 7 x weekly - 3 sets - 10 reps Seated Quad Set - 1 x daily - 7 x weekly - 3 sets - 10 reps  Pt left oob in recliner w/chair alarm set and needs in reach.     Therapy Documentation Precautions:  Precautions Precautions: Back Precaution Comments: 3/3 recalled Required Braces or Orthoses: Other Brace (L ankle) Spinal Brace  Comments: No brace per MD note Other Brace: Utilized lace up ankle brace today that pt brought from home Restrictions Weight Bearing Restrictions: No   Therapy/Group: Individual Therapy  Jerrilyn Cairo 09/04/2021, 9:02 AM

## 2021-09-04 NOTE — Progress Notes (Signed)
PROGRESS NOTE   Subjective/Complaints:  Pt reports L low back pain MUCH better- no pain with therapy on Friday or Sunday- no therapy Saturday.   Legs feel a little heavy yesterday- but cold be due to lack of therapy Saturday.    ROS:   Pt denies SOB, abd pain, CP, N/V/C/D, and vision changes    Objective:   No results found. Recent Labs    09/04/21 0657  WBC 4.4  HGB 9.8*  HCT 30.0*  PLT 303     No results for input(s): NA, K, CL, CO2, GLUCOSE, BUN, CREATININE, CALCIUM in the last 72 hours.   Intake/Output Summary (Last 24 hours) at 09/04/2021 0810 Last data filed at 09/04/2021 0730 Gross per 24 hour  Intake 1140 ml  Output 1250 ml  Net -110 ml        Physical Exam: Vital Signs Blood pressure 107/73, pulse 75, temperature 97.8 F (36.6 C), temperature source Oral, resp. rate 16, height 6' (1.829 m), weight 125.8 kg, SpO2 97 %.  General: awake, alert, appropriate, NAD HENT: conjugate gaze; oropharynx moist CV: regular rate; no JVD Pulmonary: CTA B/L; no W/R/R- good air movement GI: soft, NT, ND, (+)BS Psychiatric: appropriate; brighter affect Neurological: [name]  Skin- no tenting MS: Much less TTP L low back ; hamstrings and anterior thighs somewhat tight R>L Motor: 5/5 in bilateral deltoid, bicep, tricep, grip Bilateral lower extremities: 2-hip flexor,3- knee extensors, 2- ankle dorsiflexor and plantar flexor, Slowly improving   Assessment/Plan: 1. Functional deficits which require 3+ hours per day of interdisciplinary therapy in a comprehensive inpatient rehab setting. Physiatrist is providing close team supervision and 24 hour management of active medical problems listed below. Physiatrist and rehab team continue to assess barriers to discharge/monitor patient progress toward functional and medical goals  Care Tool:  Bathing    Body parts bathed by patient: Right arm, Left arm, Chest,  Abdomen, Front perineal area, Buttocks, Right upper leg, Left upper leg, Right lower leg, Face   Body parts bathed by helper: Right lower leg, Left lower leg     Bathing assist Assist Level: Minimal Assistance - Patient > 75%     Upper Body Dressing/Undressing Upper body dressing   What is the patient wearing?: Hospital gown only    Upper body assist Assist Level: Supervision/Verbal cueing    Lower Body Dressing/Undressing Lower body dressing      What is the patient wearing?: Underwear/pull up, Pants     Lower body assist Assist for lower body dressing: Contact Guard/Touching assist Assistive Device Comment: walker   Toileting Toileting    Toileting assist Assist for toileting: Moderate Assistance - Patient 50 - 74% Assistive Device Comment: walker   Transfers Chair/bed transfer  Transfers assist     Chair/bed transfer assist level: Contact Guard/Touching assist     Locomotion Ambulation   Ambulation assist      Assist level: Contact Guard/Touching assist Assistive device: Walker-rolling Max distance: 90'   Walk 10 feet activity   Assist     Assist level: Contact Guard/Touching assist Assistive device: Walker-rolling   Walk 50 feet activity   Assist Walk 50 feet with 2 turns activity did  not occur: Safety/medical concerns  Assist level: Contact Guard/Touching assist Assistive device: Walker-rolling    Walk 150 feet activity   Assist Walk 150 feet activity did not occur: Safety/medical concerns         Walk 10 feet on uneven surface  activity   Assist Walk 10 feet on uneven surfaces activity did not occur: Safety/medical concerns         Wheelchair     Assist Is the patient using a wheelchair?: Yes Type of Wheelchair: Manual    Wheelchair assist level: Supervision/Verbal cueing Max wheelchair distance: 200'    Wheelchair 50 feet with 2 turns activity    Assist        Assist Level: Supervision/Verbal cueing    Wheelchair 150 feet activity     Assist      Assist Level: Supervision/Verbal cueing   Blood pressure 107/73, pulse 75, temperature 97.8 F (36.6 C), temperature source Oral, resp. rate 16, height 6' (1.829 m), weight 125.8 kg, SpO2 97 %.    Medical Problem List and Plan: 1.  Decline in self care and mobility  secondary to Thoracic myelopathy  D/c 11/30- con't CIR PT and OT 2.  Impaired mobility: Continue Lovenox             -antiplatelet therapy: N/A 3. Chronic Pain Management:  Sees Dr. Glenford Bayley on oxycodone 5 mg qid, gabapentin and Cymbalta.   -Continue Oxycodone prn 11/16- skelaxin didn't work- d/ced-  Lidoderm patches 8pm to 8am and flexeril nightly 10 mg  trigger point injections   11/21- will d/c flexeril and add Valium 5 mg QHS- and do trigger point injections again Tuesdya if need be.  11/22- Valium worked well- will schedule 2 mg in AM and con't 5 mg QHS- if need be, might need to do trigger point injections again, but will see how it goes. 11/23- will try to do TrP injections Friday and added Keppra 250 mg BID for nerve pain. Wait on increasing Valium.  11/28- TrP injections on Friday- pain MUCH improved- con't regimen  4. Mood: LCSW to follow for evaluation and support.              -antipsychotic agents: N/A 5. Neuropsych: This patient is capable of making decisions on his own behalf. 6. Skin/Wound Care: Monitor wound for healing.  7. Fluids/Electrolytes/Nutrition: Monitor I/O.  8. T2DM with hyperglycemia: Hgb A1c-6.2 and BS controlled on low dose metformin --Monitor BS ac/hs and use SSI as needed.  11/22- off metformin due to rising Cr- Bgs up slightly, but will con't regimen with SSI 11/28- CBG's 103-141- con't regimen 9. HTN; BP TID--on atenolol/hygroton and low dose catapres.   11/24- BP controlled- con't regimen 11. Neuropathy: Continue gabapentin TID. 12. Dyslipidemia: Continue Zocor 13. AKI and ABLA-     11/15- Hb stable at 10.3; Cr slightly better  at 1.52  Creatinine 1.30 on 1/18, labs ordered for Monday  11/21- Cr up to 1.5 and BUN 26- not drinking enough- push fluids- recheck Wednesday- might need IVFs again.   11/22- labs in AM  11/23- Cr down to 1.32 and BUN down to 21- will remove IV and recheck Monday.   11/28- labs pending this AM- will act on them once back.   T LOS: 14 days A FACE TO FACE EVALUATION WAS PERFORMED  Keith [name] 09/04/2021, 8:10 AM

## 2021-09-04 NOTE — Progress Notes (Signed)
Physical Therapy Session Note  Patient Details  Name: Keith [name] MRN: [id number] Date of Birth: 01/09/1951  Today's Date: 09/04/2021 PT Individual Time: 1045-1200 PT Individual Time Calculation (min): 75 min   Short Term Goals: Week 2:  PT Short Term Goal 1 (Week 2): =LTG due to ELOS  Skilled Therapeutic Interventions/Progress Updates:    Pt received seated in recliner in room, agreeable to PT session. No complaints of pain intially, does have onset of pain at surgical site at end of session. Pt to request pain medication for pain management. Sit to stand with min A to RW during session. Transfers with CGA and RW during session. Ambulation x 106 ft, x 60 ft, x 70 ft with RW and CGA for balance, decreased B hip and knee flexion during gait. Standing alt L/R 1" step-taps with RW and min A for balance with focus on hip flexor strengthening and decreased compensatory methods, 2 x 5 reps B. Tried standing HS curls, pt unable to lift RLE and minimal movement against gravity noted with LLE. Sidesteps L/R 2 x 5 ft each direction with RW and min A for balance. Pt reports BLE fatigue, agreeable for UB endurance training. Seated 4# dowel rod bicep curls, chest press x 15 reps each. Manual w/c propulsion x 200 ft with use of BUE at Supervision level. Pt returned to recliner in room at end of session, needs in reach.  Therapy Documentation Precautions:  Precautions Precautions: Back Precaution Comments: 3/3 recalled Required Braces or Orthoses: Other Brace (L ankle) Spinal Brace Comments: No brace per MD note Other Brace: Utilized lace up ankle brace today that pt brought from home Restrictions Weight Bearing Restrictions: No     Therapy/Group: Individual Therapy   Excell Seltzer, PT, DPT, CSRS  09/04/2021, 4:44 PM

## 2021-09-04 NOTE — Progress Notes (Signed)
   09/04/21 1200  Clinical Encounter Type  Visited With Patient  Visit Type Initial;Spiritual support  Referral From Nurse  Consult/Referral To Chaplain   Chaplain Jorene Guest responded to the consult request. The patient is sitting in a chair beside the bed. Ike Bene actively listened to the patient's concern about whether he was ready to go home. The patient said he would have a support system to assist. The patient shared feelings about his challenge in physical therapy. The patient engaged in storytelling about his life and how events caused him to reconnect to his faith and spiritual beliefs. Ike Bene offered support, presence, and prayer at his bedside. This note was prepared by Jeanine Luz, M.Div..  For questions please contact by phone [phone].

## 2021-09-05 LAB — GLUCOSE, CAPILLARY
Glucose-Capillary: 78 mg/dL (ref 70–99)
Glucose-Capillary: 96 mg/dL (ref 70–99)

## 2021-09-05 NOTE — Progress Notes (Signed)
PROGRESS NOTE   Subjective/Complaints:  Pt reports it's the 4th days since pain MUCH better- no pain in trigger point with sitting in bedside chair or W/C, which is was doing before and not limiting therapy anymore.  More awake after shower this AM.    ROS:   Pt denies SOB, abd pain, CP, N/V/C/D, and vision changes    Objective:   No results found. Recent Labs    09/04/21 0657  WBC 4.4  HGB 9.8*  HCT 30.0*  PLT 303     Recent Labs    09/04/21 0657  NA 139  K 3.8  CL 106  CO2 25  GLUCOSE 129*  BUN 19  CREATININE 1.31*  CALCIUM 9.0     Intake/Output Summary (Last 24 hours) at 09/05/2021 0910 Last data filed at 09/05/2021 0156 Gross per 24 hour  Intake 460 ml  Output 700 ml  Net -240 ml        Physical Exam: Vital Signs Blood pressure 123/64, pulse 71, temperature 97.6 F (36.4 C), resp. rate 18, height 6' (1.829 m), weight 125.8 kg, SpO2 95 %.  General: awake, alert, appropriate, sitting up in w/c at sink- dressing; OTA in room;  NAD HENT: conjugate gaze; oropharynx moist CV: regular rate; no JVD Pulmonary: CTA B/L; no W/R/R- good air movement GI: soft, NT, ND, (+)BS Psychiatric: appropriate- brighter Neurological: [name] Skin- no tenting MS: Much less TTP L low back ; hamstrings and anterior thighs somewhat tight R>L Motor: 5/5 in bilateral deltoid, bicep, tricep, grip Bilateral lower extremities: 2-hip flexor,3- knee extensors, 2- ankle dorsiflexor and plantar flexor, Slowly improving   Assessment/Plan: 1. Functional deficits which require 3+ hours per day of interdisciplinary therapy in a comprehensive inpatient rehab setting. Physiatrist is providing close team supervision and 24 hour management of active medical problems listed below. Physiatrist and rehab team continue to assess barriers to discharge/monitor patient progress toward functional and medical goals  Care Tool:  Bathing     Body parts bathed by patient: Right arm, Left arm, Chest, Abdomen, Front perineal area, Buttocks, Right upper leg, Left upper leg, Right lower leg, Face, Left lower leg   Body parts bathed by helper: Right lower leg, Left lower leg     Bathing assist Assist Level: Supervision/Verbal cueing     Upper Body Dressing/Undressing Upper body dressing   What is the patient wearing?: Pull over shirt    Upper body assist Assist Level: Independent    Lower Body Dressing/Undressing Lower body dressing      What is the patient wearing?: Underwear/pull up, Pants     Lower body assist Assist for lower body dressing: Supervision/Verbal cueing Assistive Device Comment: walker   Toileting Toileting    Toileting assist Assist for toileting: Supervision/Verbal cueing Assistive Device Comment: walker   Transfers Chair/bed transfer  Transfers assist     Chair/bed transfer assist level: Contact Guard/Touching assist     Locomotion Ambulation   Ambulation assist      Assist level: Contact Guard/Touching assist Assistive device: Walker-rolling Max distance: 106'   Walk 10 feet activity   Assist     Assist level: Contact Guard/Touching assist Assistive device: Walker-rolling  Walk 50 feet activity   Assist Walk 50 feet with 2 turns activity did not occur: Safety/medical concerns  Assist level: Contact Guard/Touching assist Assistive device: Walker-rolling    Walk 150 feet activity   Assist Walk 150 feet activity did not occur: Safety/medical concerns         Walk 10 feet on uneven surface  activity   Assist Walk 10 feet on uneven surfaces activity did not occur: Safety/medical concerns         Wheelchair     Assist Is the patient using a wheelchair?: Yes Type of Wheelchair: Manual    Wheelchair assist level: Supervision/Verbal cueing Max wheelchair distance: 200'    Wheelchair 50 feet with 2 turns activity    Assist        Assist  Level: Supervision/Verbal cueing   Wheelchair 150 feet activity     Assist      Assist Level: Supervision/Verbal cueing   Blood pressure 123/64, pulse 71, temperature 97.6 F (36.4 C), resp. rate 18, height 6' (1.829 m), weight 125.8 kg, SpO2 95 %.    Medical Problem List and Plan: 1.  Decline in self care and mobility  secondary to Thoracic myelopathy  D/c 11/30- con't CIR PT and OT  Con't PT and OT/CIR d/c tomorrow- team conference today to finalize d/c plans 2.  Impaired mobility: Continue Lovenox             -antiplatelet therapy: N/A 3. Chronic Pain Management:  Sees Dr. Glenford Bayley on oxycodone 5 mg qid, gabapentin and Cymbalta.   -Continue Oxycodone prn 11/16- skelaxin didn't work- d/ced-  Lidoderm patches 8pm to 8am and flexeril nightly 10 mg  trigger point injections   11/21- will d/c flexeril and add Valium 5 mg QHS- and do trigger point injections again Tuesdya if need be.  11/22- Valium worked well- will schedule 2 mg in AM and con't 5 mg QHS- if need be, might need to do trigger point injections again, but will see how it goes. 11/23- will try to do TrP injections Friday and added Keppra 250 mg BID for nerve pain. Wait on increasing Valium.  11/29- pain MUCH better- so will con't regimen- will try to peel off Nerve pain meds at hospital f/u.  4. Mood: LCSW to follow for evaluation and support.              -antipsychotic agents: N/A 5. Neuropsych: This patient is capable of making decisions on his own behalf. 6. Skin/Wound Care: Monitor wound for healing.  7. Fluids/Electrolytes/Nutrition: Monitor I/O.  8. T2DM with hyperglycemia: Hgb A1c-6.2 and BS controlled on low dose metformin --Monitor BS ac/hs and use SSI as needed.  11/22- off metformin due to rising Cr- Bgs up slightly, but will con't regimen with SSI 11/28- CBG's 103-141- con't regimen  11/29- BG's 78-117- con't regimen 9. HTN; BP TID--on atenolol/hygroton and low dose catapres.   11/24- BP controlled-  con't regimen  11/29- BP controlled- con't regimen 11. Neuropathy: Continue gabapentin TID. 12. Dyslipidemia: Continue Zocor 13. AKI and ABLA-     11/15- Hb stable at 10.3; Cr slightly better at 1.52  Creatinine 1.30 on 1/18, labs ordered for Monday  11/21- Cr up to 1.5 and BUN 26- not drinking enough- push fluids- recheck Wednesday- might need IVFs again.   11/22- labs in AM  11/23- Cr down to 1.32 and BUN down to 21- will remove IV and recheck Monday.   11/28- labs pending this AM- will act on  them once back.   11/29- Cr better at 1.31- and BUN 19   LOS: 15 days A FACE TO FACE EVALUATION WAS PERFORMED  Sherlyn Ebbert 09/05/2021, 9:10 AM

## 2021-09-05 NOTE — Progress Notes (Signed)
Patient ID: Keith [name], male   DOB: 24-Aug-1951, 70 y.o.   MRN: [id number]  SW met with Keith and Keith [name] in room to provide updates from team conference and d/c date remains 11/30. SW reviewed d/c with regard to HHA. Keith requested TTB. SW ordered item with [employer] via parachute.    Loralee Pacas, MSW, [place] Office: [phone] Cell: [phone] Fax: [phone]

## 2021-09-05 NOTE — Progress Notes (Signed)
Inpatient Rehabilitation Discharge Medication Review by a Pharmacist  A complete drug regimen review was completed for this patient to identify any potential clinically significant medication issues.  High Risk Drug Classes Is patient taking? Indication by Medication  Antipsychotic No   Anticoagulant No   Antibiotic No   Opioid Yes Percocet- pain  Antiplatelet No   Hypoglycemics/insulin No   Vasoactive Medication Yes Atenolol, hydroton, clonidine- HTN  Chemotherapy No   Other Yes Zocor- HLD Keppra, valium, Duloxetine - Chronic pain management Gabapentin- neuropathy     Type of Medication Issue Identified Description of Issue Recommendation(s)  Drug Interaction(s) (clinically significant)     Duplicate Therapy     Allergy     No Medication Administration End Date     Incorrect Dose     Additional Drug Therapy Needed     Significant med changes from prior encounter (inform family/care partners about these prior to discharge).    Other       Clinically significant medication issues were identified that warrant physician communication and completion of prescribed/recommended actions by midnight of the next day:  No   Time spent performing this drug regimen review (minutes):  20   Kimi Kroft BS, PharmD, BCPS Clinical Pharmacist 09/05/2021 11:08 AM

## 2021-09-05 NOTE — Progress Notes (Signed)
Occupational Therapy Session Note  Patient Details  Name: Keith [name] MRN: [id number] Date of Birth: 1950-10-31  Today's Date: 09/05/2021 OT Individual Time: 0700-0826 OT Individual Time Calculation (min): 86 min    Short Term Goals: Week 2:  OT Short Term Goal 1 (Week 2): STGs = LTGs 2/2 LOS  Skilled Therapeutic Interventions/Progress Updates:    Pt resting in bed upon arrival and ready to get his day started. OT intervention with focus on bed mobility, sit<>stand, functional amb with RW, functional transfers, standing balance, bathing at shower level, and dressing with sit<>stand from w/c at sink. Pt requires assistance with donning Lt ankle brace but completes all other dressing tasks with supervision using AE PRN. Pt completes bathing at shower level with supervision using long handle sponge for BLE. TTB transfers with supervision. Sit<>stand from various surface heights with supervision. Pt requires more then a reasonable amount of time to complete tasks. Pt transferred to recliner at end of session. All needs within reach. Pt looking forward to discharge home tomorrow.   Therapy Documentation Precautions:  Precautions Precautions: Back Precaution Comments: 3/3 recalled Required Braces or Orthoses: Other Brace (L ankle) Spinal Brace Comments: No brace per MD note Other Brace: Utilized lace up ankle brace today that pt brought from home Restrictions Weight Bearing Restrictions: No  Pain: Pt reports he received pain meds approx 60 mins prior to therapy and was feeling "ok" at the moment  Therapy/Group: Individual Therapy  Leroy Libman 09/05/2021, 8:27 AM

## 2021-09-05 NOTE — Progress Notes (Signed)
Occupational Therapy Discharge Summary  Patient Details  Name: Keith [name] MRN: [id number] Date of Birth: Feb 16, 1951  Patient has met 8 of 9 long term goals due to improved activity tolerance, improved balance, postural control, ability to compensate for deficits, and improved coordination.  Pt made steady progress with BADLs and functional transfers during this admission. Pt continues to require assistance donning Lt ankle brace but completes all other bathing/dressing taskes with supervision. All functional transfers with supervision. Toileting with supervision. [name] has attended family education and provides the appropriate level of supervisoin/assistance. Patient to discharge at overall Supervision level.  Patient's care partner is independent to provide the necessary physical assistance at discharge.    Reasons goals not met: Pt is Min A for LB dressing.   Recommendation:  Patient will benefit from ongoing skilled OT services in home health setting to continue to advance functional skills in the area of BADL, iADL, and Reduce care partner burden.  Equipment: No equipment provided  Reasons for discharge: treatment goals met and discharge from hospital  Patient/family agrees with progress made and goals achieved: Yes  OT Discharge Vision Baseline Vision/History: 1 Wears glasses Patient Visual Report: No change from baseline Vision Assessment?: No apparent visual deficits Perception  Perception: Within Functional Limits Praxis Praxis: Intact Cognition Overall Cognitive Status: Within Functional Limits for tasks assessed Arousal/Alertness: Awake/alert Orientation Level: Oriented X4 Year: 2022 Month: November Day of Week: Correct Attention: Focused;Sustained;Selective Focused Attention: Appears intact Sustained Attention: Appears intact Selective Attention: Appears intact Memory: Appears intact Immediate Memory Recall: Sock;Blue;Bed Memory Recall Sock: Without  Cue Memory Recall Blue: Without Cue Memory Recall Bed: Without Cue Awareness: Appears intact Problem Solving: Appears intact Safety/Judgment: Appears intact Sensation Sensation Light Touch: Impaired Detail Light Touch Impaired Details: Impaired RLE;Impaired LLE Proprioception: Impaired Detail Proprioception Impaired Details: Impaired RLE;Impaired LLE Coordination Gross Motor Movements are Fluid and Coordinated: Yes Fine Motor Movements are Fluid and Coordinated: Yes Finger Nose Finger Test: [facility] Motor  Motor Motor: Abnormal postural alignment and control Motor - Skilled Clinical Observations: grossly limited 2/2 BLE weakness and back precautions Trunk/Postural Assessment  Cervical Assessment Cervical Assessment: Within Functional Limits Thoracic Assessment Thoracic Assessment: Exceptions to [facility] (back precautions) Lumbar Assessment Lumbar Assessment: Exceptions to [facility] (back precautions) Postural Control Postural Control: Deficits on evaluation (insufficient)  Balance Static Sitting Balance Static Sitting - Level of Assistance: 6: Modified independent (Device/Increase time) Dynamic Sitting Balance Dynamic Sitting - Balance Support: During functional activity Dynamic Sitting - Level of Assistance: 6: Modified independent (Device/Increase time) Extremity/Trunk Assessment RUE Assessment RUE Assessment: Within Functional Limits General Strength Comments: WFL for ADL tasks LUE Assessment LUE Assessment: Within Functional Limits General Strength Comments: WFL for ADL tasks   Leroy Libman 09/05/2021, 8:31 AM

## 2021-09-05 NOTE — Progress Notes (Signed)
Physical Therapy Discharge Summary  Patient Details  Name: Keith [name] MRN: [id number] Date of Birth: [birth year]  Patient has met 5 of 10 long term goals due to improved activity tolerance, improved balance, improved postural control, decreased pain, and ability to compensate for deficits.  Patient to discharge at an ambulatory level Supervision.   Patient's care partner is independent to provide the necessary physical assistance at discharge. [name] has completed hands-on family education and is safe to assist pt upon d/c home.  Reasons goals not met: Pt did not meet mod I level goal for bed mobility due to min A being needed for some LE management with sit to supine, did not meet sit to stand goal of Supervision as he requires min A from lower surfaces and when fatigued, did not meet car transfer goal of Supervision as he requires mod A for management of BLE in/out of car, did not meet gait goal of 150 ft as he is able to ambulate 106 ft at the most, and did not meet stair goal of 12 stairs with min A as he only currently able to navigate 8 x 3" stairs due to ongoing BLE weakness.  Recommendation:  Patient will benefit from ongoing skilled PT services in home health setting to continue to advance safe functional mobility, address ongoing impairments in endurance, strength, safety, independence with functional mobility, pain management, balance, coordination, and minimize fall risk.  Equipment: No equipment provided. Pt already owns RW and w/c.  Reasons for discharge: treatment goals met and discharge from hospital  Patient/family agrees with progress made and goals achieved: Yes  PT Discharge Precautions/Restrictions Precautions Precautions: Back Precaution Comments: 3/3 recalled Required Braces or Orthoses: Other Brace (L ankle brace) Restrictions Weight Bearing Restrictions: No Pain Interference Pain Interference Pain Effect on Sleep: 2. Occasionally Pain Interference with  Therapy Activities: 2. Occasionally Pain Interference with Day-to-Day Activities: 2. Occasionally Vision/Perception  Perception Perception: Within Functional Limits Praxis Praxis: Intact  Cognition Overall Cognitive Status: Within Functional Limits for tasks assessed Arousal/Alertness: Awake/alert Orientation Level: Oriented X4 Year: 2022 Attention: Focused;Sustained;Selective Focused Attention: Appears intact Sustained Attention: Appears intact Selective Attention: Appears intact Memory: Appears intact Awareness: Appears intact Problem Solving: Appears intact Safety/Judgment: Appears intact Sensation Sensation Light Touch: Impaired Detail Light Touch Impaired Details: Impaired RLE;Impaired LLE Proprioception: Impaired Detail Proprioception Impaired Details: Impaired RLE;Impaired LLE Coordination Gross Motor Movements are Fluid and Coordinated: No Fine Motor Movements are Fluid and Coordinated: Yes Coordination and Movement Description: impaired 2/2 ongoing BLE weakness Motor  Motor Motor: Abnormal postural alignment and control Motor - Discharge Observations: impaired 2/2 BLE weakness  Mobility Bed Mobility Bed Mobility: Rolling Right;Rolling Left;Supine to Sit;Sit to Supine Rolling Right: Independent with assistive device Rolling Left: Independent with assistive device Supine to Sit: Independent with assistive device Sit to Supine: Minimal Assistance - Patient > 75% Transfers Transfers: Sit to Stand;Stand Pivot Transfers Sit to Stand: Minimal Assistance - Patient > 75% Stand to Sit: Minimal Assistance - Patient > 75% Stand Pivot Transfers: Supervision/Verbal cueing Stand Pivot Transfer Details: Verbal cues for technique;Verbal cues for safe use of DME/AE;Verbal cues for sequencing;Verbal cues for precautions/safety Transfer (Assistive device): Rolling walker Locomotion  Gait Ambulation: Yes Gait Assistance: Supervision/Verbal cueing Gait Distance (Feet): 106  Feet Assistive device: Rolling walker Gait Assistance Details: Verbal cues for precautions/safety Gait Gait: Yes Gait Pattern: Impaired Gait Pattern: Decreased step length - right;Decreased step length - left;Decreased hip/knee flexion - right;Decreased hip/knee flexion - left;Decreased dorsiflexion - right;Decreased dorsiflexion - left  Gait velocity: decreased Stairs / Additional Locomotion Stairs: Yes Stairs Assistance: Minimal Assistance - Patient > 75% Stair Management Technique: Two rails;Step to pattern Number of Stairs: 8 Height of Stairs: 3 Pick up small object from the floor (from standing position) activity did not occur: Safety/medical concerns Product manager Mobility: Yes Wheelchair Assistance: Independent with Camera operator: Both upper extremities Wheelchair Parts Management: Supervision/cueing Distance: 200  Trunk/Postural Assessment  Cervical Assessment Cervical Assessment: Within Functional Limits Thoracic Assessment Thoracic Assessment: Exceptions to [facility] (back precautions) Lumbar Assessment Lumbar Assessment: Exceptions to [facility] (back precautions) Postural Control Postural Control: Deficits on evaluation (insufficient)  Balance Balance Balance Assessed: Yes Static Sitting Balance Static Sitting - Balance Support: No upper extremity supported;Feet supported Static Sitting - Level of Assistance: 6: Modified independent (Device/Increase time) Dynamic Sitting Balance Dynamic Sitting - Balance Support: During functional activity Dynamic Sitting - Level of Assistance: 6: Modified independent (Device/Increase time) Static Standing Balance Static Standing - Balance Support: Bilateral upper extremity supported;During functional activity Static Standing - Level of Assistance: 5: Stand by assistance Dynamic Standing Balance Dynamic Standing - Balance Support: Bilateral upper extremity supported;During functional activity Dynamic  Standing - Level of Assistance: 5: Stand by assistance Extremity Assessment    RLE Assessment RLE Assessment: Exceptions to [facility] Passive Range of Motion (PROM) Comments: tight HS General Strength Comments: impaired, see below RLE Strength Right Hip Flexion: 3-/5 Right Knee Flexion: 3/5 Right Knee Extension: 4/5 Right Ankle Dorsiflexion: 4/5 LLE Assessment LLE Assessment: Exceptions to [facility] Passive Range of Motion (PROM) Comments: tight HS General Strength Comments: impaired, see below LLE Strength Left Hip Flexion: 3-/5 Left Knee Flexion: 4/5 Left Knee Extension: 4/5 Left Ankle Dorsiflexion: 4/5     Excell Seltzer, PT, DPT, CSRS 09/05/2021, 6:03 PM

## 2021-09-05 NOTE — Patient Care Conference (Signed)
Inpatient RehabilitationTeam Conference and Plan of Care Update Date: 09/05/2021   Time: 11:20 AM    Patient Name: Keith [name]      Medical Record Number: [id number]  Date of Birth: 09/24/1951 Sex: Male         Room/Bed: 5C18C/5C18C-01 Payor Info: Payor: Marine scientist / Plan: [employer] MEDICARE / Product Type: *No Product type* /    Admit Date/Time:  08/21/2021  4:09 PM  Primary Diagnosis:  Thoracic myelopathy  Hospital Problems: Principal Problem:   Thoracic myelopathy Active Problems:   Controlled type 2 diabetes mellitus with hyperglycemia, without long-term current use of insulin [facility])    Expected Discharge Date: Expected Discharge Date: 09/06/21  Team Members Present: Physician leading conference: Dr. Courtney Heys Social Worker Present: Loralee Pacas, [place] Nurse Present: Dorthula Nettles, RN PT Present: Excell Seltzer, PT OT Present: Roanna Epley, COTA;Jennifer Tamala Julian, OT PPS Coordinator present : Gunnar Fusi, SLP     Current Status/Progress Goal Weekly Team Focus  Bowel/Bladder   continent B/B  remain continent  toilet prn   Swallow/Nutrition/ Hydration             ADL's   Bathing-supervision; dressing with sit<>stand supervision (min A for ankle brace); functrional transfers-supervision  Supervision overall  transfers, education, safety awareness, BADLs, Ae training   Mobility   min A bed mobility, min A sit to stand, CGA gait up to 106 ft with RW, 8 x 3" stairs 2 handrails min to mod A, Supervision w/c mobility  Supervision at ambulatory level, min A stairs  LE strengthening, gait, stairs   Communication             Safety/Cognition/ Behavioral Observations            Pain   back pain, has prn medication and lidocaine patches     assess pain q 4hr and prn   Skin   surgical incision closed with skin glue  no new breakdown  assess skin q shift and prn     Discharge Planning:  Pt to d/c to home with his wife. Fam edu completed on 11/23  9am-11am. Pt has [facility] for HHPT/OT. 2nd  Fam edu session on 11/29 1pm-3pm.   Team Discussion: MD able to get trigger point. Pain is better. Cr/kidney function improving. Discontinued Metformin. Adjusting medications. Back pain relieved with PRN's and Lidocaine patches. Family education scheduled for today with spouse. Ramp for home has been ordered. Fatigues quickly. Patient on target to meet rehab goals: yes, supervision goals reached. Does require some assist to donn AFO. Unable to do 6" steps due to weakness in legs.   *See Care Plan and progress notes for long and short-term goals.   Revisions to Treatment Plan:  Adjusting medications, finalizing discharge plans  Teaching Needs: Family education completed.  Current Barriers to Discharge: Kidney function  Possible Resolutions to Barriers: Follow up with primary for kidneys     Medical Summary Current Status: pain now not biggest limiter; CBGs controlled; has prns and lidocaine patches; Also on Valium BID and Keppra, Cymblata and Lyrica- continent- wound healed over  Barriers to Discharge: Home enviroment access/layout;Weight bearing restrictions;Weight  Barriers to Discharge Comments: family education today- Possible Resolutions to [employer] Focus: weakness in legs is biggest limiter- has ordered ramp - will be delivered after d/c- can be bumped up x 1-2 days-is Supervision-min A- d/c tomorrow /11/30- needs help putting on ankle brace- Cr down to baseline of 1.30- will need f/u- to make sure stays  ok with PCP.   Continued Need for Acute Rehabilitation Level of Care: The patient requires daily medical management by a physician with specialized training in physical medicine and rehabilitation for the following reasons: Direction of a multidisciplinary physical rehabilitation program to maximize functional independence : Yes Medical management of patient stability for increased activity during participation in an intensive  rehabilitation regime.: Yes Analysis of laboratory values and/or radiology reports with any subsequent need for medication adjustment and/or medical intervention. : Yes   I attest that I was present, lead the team conference, and concur with the assessment and plan of the team.   Cristi Loron 09/05/2021, 4:34 PM

## 2021-09-05 NOTE — Progress Notes (Addendum)
Physical Therapy Session Note  Patient Details  Name: Keith [name] MRN: [id number] Date of Birth: [birth year]  Today's Date: 09/05/2021 PT Individual Time: 0900-1000; 1500-1600 PT Individual Time Calculation (min): 60 min and 60 min  Short Term Goals: Week 2:  PT Short Term Goal 1 (Week 2): =LTG due to ELOS  Skilled Therapeutic Interventions/Progress Updates:    Session 1: Pt received seated in recliner in room, agreeable to PT session. No complaints of pain this date. Sit to stand with min A to RW during session. Ambulation x 106 ft, x 46 ft with RW and close Supervision for balance. Pt continues to exhibit decreased B hip and knee weakness with decreased LE clearance during gait. Manual w/c propulsion up to 200 ft at mod I level with use of BUE. Reassessed BLE sensation and strength, see Flowsheet in d/c note for details. Sit to supine with min A needed for LLE management, returned to sitting at mod I level with use of bedrails per pt's home setup. Pt returned to recliner in room at end of session, needs in reach.  Session 2: Pt received seated in recliner in room, agreeable to PT session. No complaints of pain. [name] present for hands-on family education session. Pt is CGA to min A to stand to RW during session, cues for anterior weight shift. Ambulation x 60 ft with RW and Supervision for balance. Car transfer with RW and mod A needed for LE management in/out of car. [name] able to provide assist for ambulation and transfers and demos good safety awareness and ability to assist pt. Demonstrated pt's current level of function on stairs, he is able to ascend/descend 3" stairs with 2 handrails with min A and circumduction of BLE due to hip and knee flexor weakness. Discussed ramp placement which pt reports will arrive Thursday and provided handout for how to safely bump someone up stairs via w/c for pt's family to assist him with until ramp in place. Pt and wife with no further  questions. Pt left seated in recliner in room with needs in reach, wife present.  Therapy Documentation Precautions:  Precautions Precautions: Back Precaution Comments: 3/3 recalled Required Braces or Orthoses: Other Brace (L ankle) Spinal Brace Comments: No brace per MD note Other Brace: Utilized lace up ankle brace today that pt brought from home Restrictions Weight Bearing Restrictions: No     Therapy/Group: Individual Therapy   Excell Seltzer, PT, DPT, CSRS  09/05/2021, 5:50 PM

## 2021-09-06 LAB — GLUCOSE, CAPILLARY: Glucose-Capillary: 106 mg/dL — ABNORMAL HIGH (ref 70–99)

## 2021-09-06 MED ORDER — ACETAMINOPHEN 325 MG PO TABS: 325.0000 mg | ORAL_TABLET | ORAL | Status: DC | PRN

## 2021-09-06 MED ORDER — POLYETHYLENE GLYCOL 3350 17 G PO PACK: 17.0000 g | PACK | Freq: Every day | ORAL | Status: AC | PRN

## 2021-09-06 MED ORDER — OXYCODONE-ACETAMINOPHEN 7.5-325 MG PO TABS
2.0000 | ORAL_TABLET | Freq: Four times a day (QID) | ORAL | 0 refills | Status: DC | PRN
Start: 2021-09-06 — End: 2022-06-25

## 2021-09-06 MED ORDER — TRAZODONE HCL 50 MG PO TABS
25.0000 mg | ORAL_TABLET | Freq: Every evening | ORAL | 0 refills | Status: DC | PRN
Start: 2021-09-06 — End: 2021-09-28

## 2021-09-06 MED ORDER — DIAZEPAM 5 MG PO TABS: 5.0000 mg | ORAL_TABLET | Freq: Every day | ORAL | 0 refills | Status: DC

## 2021-09-06 MED ORDER — NALOXONE HCL 4 MG/0.1ML NA LIQD: 1.0000 | Freq: Once | NASAL | 1 refills | Status: AC

## 2021-09-06 MED ORDER — LEVETIRACETAM 250 MG PO TABS: 250.0000 mg | ORAL_TABLET | Freq: Two times a day (BID) | ORAL | 0 refills | Status: DC

## 2021-09-06 MED ORDER — LIDOCAINE 5 % EX PTCH: 2.0000 | MEDICATED_PATCH | CUTANEOUS | 0 refills | Status: DC

## 2021-09-06 MED ORDER — GABAPENTIN 600 MG PO TABS: 600.0000 mg | ORAL_TABLET | Freq: Three times a day (TID) | ORAL | 0 refills | Status: DC

## 2021-09-06 MED ORDER — DULOXETINE HCL 30 MG PO CPEP: 60.0000 mg | ORAL_CAPSULE | Freq: Every day | ORAL | 0 refills | Status: DC

## 2021-09-06 MED ORDER — DIAZEPAM 2 MG PO TABS: 2.0000 mg | ORAL_TABLET | Freq: Every morning | ORAL | 0 refills | Status: DC

## 2021-09-06 NOTE — Progress Notes (Signed)
Inpatient Rehabilitation Care Coordinator Discharge Note   Patient Details  Name: HOLMES HAYS MRN: [id number] Date of Birth: 1950/11/10   Discharge location: D/c to home with his wife.  Length of Stay: 15 days  Discharge activity level: Supervision  Home/community participation: Limited  Patient response XY:OFVWAQ Literacy - How often do you need to have someone help you when you read instructions, pamphlets, or other written material from your doctor or pharmacy?: Never  Patient response LR:JPVGKK Isolation - How often do you feel lonely or isolated from those around you?: Never  Services provided included: MD, RD, PT, OT, RN, CM, TR, Pharmacy, Neuropsych, SW  Financial Services:  Charity fundraiser Utilized: Multimedia programmer [employer]  Choices offered to/list presented to: Yes  Follow-up services arranged:  DME, [place]: [facility] for HHPT/OT    DME : [employer] for TTB    Patient response to transportation need: Is the patient able to respond to transportation needs?: Yes In the past 12 months, has lack of transportation kept you from medical appointments or from getting medications?: No In the past 12 months, has lack of transportation kept you from meetings, work, or from getting things needed for daily living?: No   Comments (or additional information):  Patient/Family verbalized understanding of follow-up arrangements:  Yes  Individual responsible for coordination of the follow-up plan: contact pt [phone]  Confirmed correct DME delivered: Rana Snare 09/06/2021    Rana Snare

## 2021-09-06 NOTE — Progress Notes (Signed)
Patient ID: Keith [name], male   DOB: 11-Oct-1950, 70 y.o.   MRN: [id number]  SW followed up to discuss  DME delivery. Pt reports he will have item delivered to his home since he did not have card information.    Loralee Pacas, MSW, [place] Office: [phone] Cell: [phone] Fax: [phone]

## 2021-09-06 NOTE — Progress Notes (Addendum)
Alert and oriented x4, compliant with medication administration. No signs or symptoms of distress noted.  Discharge instructions/medications reviewed provided by Reesa Chew, PA with patient/ wife-via telephone. Staff assisted patient off the unit; patient discharged via private car.

## 2021-09-06 NOTE — Progress Notes (Signed)
Discussed re: use of narcotic w/sedatives and it's affect on respiratory status, overdose as well as addition potential.  He denies having pain contract with Dr. Brien Few. Per PDMP--had perococet 7.5/325 mg filled day after admission to hospital--#120 filled on 08/07/21.   Has been taking percocet 10/325 mg three times a day overall and advised him to return to equivalent home regimen of 7.5 mg qid. He has been tolerating valium and Percocet without SE. Rx for Narcan also given. Above discussed with patient and wife (on speaker phone)

## 2021-09-06 NOTE — Discharge Summary (Signed)
Physician Discharge Summary  Patient ID: Keith [name] MRN: [id number] DOB/AGE: [birth year] 70 y.o.  Admit date: 08/21/2021 Discharge date: 09/06/2021  Discharge Diagnoses:  Principal Problem:   Thoracic myelopathy Active Problems:   Essential hypertension   AKI (acute kidney injury) ([place])   Diabetic peripheral neuropathy (HCC)   Acute blood loss anemia   Controlled type 2 diabetes mellitus with hyperglycemia, without long-term current use of insulin (HCC)   Discharged Condition: good  Significant Diagnostic Studies: N/A   Labs:  Basic Metabolic Panel: BMP Latest Ref Rng & Units 09/04/2021 08/30/2021 08/28/2021  Glucose 70 - 99 mg/dL 129(H) 135(H) 116(H)  BUN 8 - 23 mg/dL 19 21 26(H)  Creatinine 0.61 - 1.24 mg/dL 1.31(H) 1.32(H) 1.50(H)  Sodium 135 - 145 mmol/L 139 138 140  Potassium 3.5 - 5.1 mmol/L 3.8 3.8 4.0  Chloride 98 - 111 mmol/L 106 107 108  CO2 22 - 32 mmol/L '25 25 25  ' Calcium 8.9 - 10.3 mg/dL 9.0 9.2 9.3     CBC: CBC Latest Ref Rng & Units 09/04/2021 08/28/2021 08/22/2021  WBC 4.0 - 10.5 K/uL 4.4 5.7 5.6  Hemoglobin 13.0 - 17.0 g/dL 9.8(L) 10.2(L) 10.3(L)  Hematocrit 39.0 - 52.0 % 30.0(L) 32.2(L) 32.2(L)  Platelets 150 - 400 K/uL 303 313 296     CBG: Recent Labs  Lab 09/04/21 0602 09/04/21 1639 09/05/21 0610 09/05/21 1646 09/06/21 0646  GLUCAP 103* 98 78 96 106*    Brief HPI:   [name] is a 70 y.o. male with history of HTN, T2DM, dyslipidemia who was admitted on 08/07/2021 with 35-monthhistory of progressive back pain with numbness in BLE.  He reported having ESI few weeks prior to admission with resolution of back pain but developed progressive weakness.  MRI spine done revealing chronic thoracic ankylosis T7-T11 with severe degenerative changes T11-T12 moderate severe spinal stenosis and subsequent cord compression T11-T12.  He was taken to the OR for T11-T12 laminectomy posterior fusion by Dr. OVenetia Constableon 11/02.  Postop has had  improvement in BLE strength but continues to be limited by balance deficits, sensory deficits as well as decreased endurance.  CIR was recommended due to functional decline.   Hospital Course: ARYU CERRETAwas admitted to rehab 08/21/2021 for inpatient therapies to consist of PT and OT at least three hours five days a week. Past admission physiatrist, therapy team and rehab RN have worked together to provide customized collaborative inpatient rehab.  Pain was poorly controlled and Skelaxin was ineffective therefore this was discontinued.  He was started on Flexeril 10 mg at night in addition to Valium 2 mg in a.m. and 5 mg at at bedtime with improvement in symptoms.  Keppra to 50 mg twice daily was also added to help manage nerve pain.  Pain control has greatly improved with use of percocet 10 TID prn on average and he has been transitioned to home dose Percocet at discharge.  His back incision is noted to be healing well and is C/D/I.    Blood pressures were monitored on TID basis and and have been well controlled on current regimen.  Blood sugars were monitored on AC/HS basis and metformin was discontinued due to acute renal failure with rise in serum creatinine.  He was encouraged to push p.o. fluids and serial check of electrolytes shows renal status to be improving.  P.o. intake has been good and his diabetes has been controlled on diet alone.  He is continent of bowel and bladder.  Follow-up check of CBC shows acute blood loss anemia to be relatively stable.   He has made steady gains during his rehab stay and supervision is recommended at discharge.  He will continue to receive follow up home health PT and OT by [facility] after discharge.    Rehab course: During patient's stay in rehab weekly team conferences were held to monitor patient's progress, set goals and discuss barriers to discharge. At admission, patient required min to mod assist for ADL tasks and for mobility. He  has had  improvement in activity tolerance, balance, postural control as well as ability to compensate for deficits.  He is able to complete ADL tasks with supervision and requires assistance for donning left ankle brace.  He requires min assist for sit to stand transfers and is able to ambulate 106 feet with rolling walker and supervisory cues.  Family education was completed with wife.  Disposition: Home  Diet: Carb modified  Special Instructions: No driving, strenuous activity, bending, twisting, arching or lifting items over 5 lbs.  Discharge Instructions     Ambulatory referral to Physical Medicine Rehab   Complete by: As directed       Allergies as of 09/06/2021       Reactions   Aspirin Other (See Comments)   Irritates the stomach and the patient developed ulcers, also   Lisinopril Other (See Comments)   Caused a body ache        Medication List     STOP taking these medications    aluminum-magnesium hydroxide-simethicone 200-200-20 MG/5ML Susp Commonly known as: MAALOX   metFORMIN 500 MG tablet Commonly known as: GLUCOPHAGE       TAKE these medications    acetaminophen 325 MG tablet Commonly known as: TYLENOL Take 1-2 tablets (325-650 mg total) by mouth every 4 (four) hours as needed for mild pain. Notes to patient: Use this or percocet as needed for pain   atenolol-chlorthalidone 50-25 MG tablet Commonly known as: TENORETIC Take 1 tablet by mouth daily.   cloNIDine 0.1 MG tablet Commonly known as: CATAPRES TAKE 1 TABLET (0.1 MG TOTAL) BY MOUTH DAILY.   diazepam 5 MG tablet--Rx# 30 pills Commonly known as: VALIUM Take 1 tablet (5 mg total) by mouth at bedtime.   diazepam 2 MG tablet--Rx #30 pills Commonly known as: VALIUM Take 1 tablet (2 mg total) by mouth in the morning. Start taking on: September 07, 2021   DULoxetine 30 MG capsule Commonly known as: CYMBALTA Take 2 capsules (60 mg total) by mouth daily.   gabapentin 600 MG tablet Commonly known  as: NEURONTIN Take 1 tablet (600 mg total) by mouth 3 (three) times daily.   levETIRAcetam 250 MG tablet Commonly known as: KEPPRA Take 1 tablet (250 mg total) by mouth 2 (two) times daily.   lidocaine 5 % Commonly known as: LIDODERM Place 2 patches onto the skin daily. Apply at 8 pm and remove at 8 am daily.   multivitamin with minerals Tabs tablet Take 1 tablet by mouth daily with breakfast.   naloxone 4 MG/0.1ML Liqd nasal spray kit Commonly known as: Narcan Place 1 spray into the nose once for 1 dose.   oxyCODONE-acetaminophen 7.5-325 MG tablet Commonly known as: PERCOCET Take 2 tablets by mouth every 6 (six) hours as needed for severe pain. Note that 120 pills were filled on 10/31--day prior to hospital admission. Taking this four times a day should be equal to using 10 mg three times a day as  in the hospital. Can wean down as able. What changed:  when to take this reasons to take this additional instructions   polyethylene glycol 17 g packet Commonly known as: MIRALAX / GLYCOLAX Take 17 g by mouth daily as needed for mild constipation.   potassium chloride SA 20 MEQ tablet Commonly known as: KLOR-CON M TAKE 1 TABLET (20 MEQ TOTAL) BY MOUTH DAILY. What changed: how much to take   simvastatin 40 MG tablet Commonly known as: ZOCOR TAKE 1 TABLET BY MOUTH EVERY DAY What changed: when to take this   traZODone 50 MG tablet Commonly known as: DESYREL Take 0.5-1 tablets (25-50 mg total) by mouth at bedtime as needed for sleep.        Follow-up Information     Lovorn, Jinny Blossom, MD Follow up.   Specialty: Physical Medicine and Rehabilitation Why: office will call you with follow up appointment Contact information: [address] [place] [address] [phone]         [name], [name], MD. Call.   Specialty: Family Medicine Why: for post hospital follow up Contact information: [place] [address] [phone]          Judith Part, MD. Call.   Specialty: Neurosurgery Why: for post op appointment Contact information: [place] [address] [phone]         Marlaine Hind, MD. Call.   Specialty: Physical Medicine and Rehabilitation Why: for follow up appt Contact information: [address] [place] [address] [phone]                 Signed: Bary Leriche 09/06/2021, 4:39 PM

## 2021-09-07 ENCOUNTER — Telehealth: Payer: Self-pay

## 2021-09-07 DIAGNOSIS — Z9181 History of falling: Secondary | ICD-10-CM | POA: Diagnosis not present

## 2021-09-07 DIAGNOSIS — E785 Hyperlipidemia, unspecified: Secondary | ICD-10-CM | POA: Diagnosis not present

## 2021-09-07 DIAGNOSIS — Z87891 Personal history of nicotine dependence: Secondary | ICD-10-CM | POA: Diagnosis not present

## 2021-09-07 DIAGNOSIS — N179 Acute kidney failure, unspecified: Secondary | ICD-10-CM | POA: Diagnosis not present

## 2021-09-07 DIAGNOSIS — K5900 Constipation, unspecified: Secondary | ICD-10-CM | POA: Diagnosis not present

## 2021-09-07 DIAGNOSIS — D62 Acute posthemorrhagic anemia: Secondary | ICD-10-CM | POA: Diagnosis not present

## 2021-09-07 DIAGNOSIS — E1165 Type 2 diabetes mellitus with hyperglycemia: Secondary | ICD-10-CM | POA: Diagnosis not present

## 2021-09-07 DIAGNOSIS — I10 Essential (primary) hypertension: Secondary | ICD-10-CM | POA: Diagnosis not present

## 2021-09-07 DIAGNOSIS — E1142 Type 2 diabetes mellitus with diabetic polyneuropathy: Secondary | ICD-10-CM | POA: Diagnosis not present

## 2021-09-07 DIAGNOSIS — Z48811 Encounter for surgical aftercare following surgery on the nervous system: Secondary | ICD-10-CM | POA: Diagnosis not present

## 2021-09-07 NOTE — Telephone Encounter (Signed)
Transition Care Management Follow-up Telephone Call Date of discharge and from where: 09/06/2021 Keith [name] How have you been since you were released from the hospital? [facility], but still sore Any questions or concerns? No  Items Reviewed: Did the pt receive and understand the discharge instructions provided? Yes  Medications obtained and verified? Yes  Other? No  Any new allergies since your discharge? No  Dietary orders reviewed? Yes Do you have support at home? Yes   Home Care and Equipment/Supplies: Were home health services ordered? yes If so, what is the name of the agency? [facility]  Has the agency set up a time to come to the patient's home? yes Were any new equipment or medical supplies ordered?  Yes: bath chair What is the name of the medical supply agency?  Were you able to get the supplies/equipment? yes Do you have any questions related to the use of the equipment or supplies? No  Functional Questionnaire: (I = Independent and D = Dependent) ADLs: I  Bathing/Dressing- I  Meal Prep- D  Eating- I  Maintaining continence- I  Transferring/Ambulation- I  Managing Meds- I  Follow up appointments reviewed:  PCP Hospital f/u appt confirmed? Yes  Scheduled to see Dr. [name] on 09/13/2021 @ 9:30. Are transportation arrangements needed? No  If their condition worsens, is the pt aware to call PCP or go to the Emergency Dept.? Yes Was the patient provided with contact information for the PCP's office or ED? Yes Was to pt encouraged to call back with questions or concerns? Yes

## 2021-09-08 ENCOUNTER — Telehealth: Payer: Self-pay | Admitting: Family Medicine

## 2021-09-08 NOTE — Telephone Encounter (Signed)
I left Charris a message approving the verbal.

## 2021-09-08 NOTE — Telephone Encounter (Signed)
Charris from [facility] called to get verbal orders for physical therapy one week one, two week three, one week one  Charris also wanted it noted that their system comes up as their being a severe interaction between atenolol-chlorthalidone (TENORETIC) 50-25 MG tablet and cloNIDine (CATAPRES) 0.1 MG tablet    Good callback number is [phone] Faythe Ghee to leave a voicemail     Please advise

## 2021-09-08 NOTE — Telephone Encounter (Signed)
It is ok to give verbal authorization to requested services. Thanks, BJ 

## 2021-09-11 ENCOUNTER — Telehealth: Payer: Self-pay

## 2021-09-11 DIAGNOSIS — Z87891 Personal history of nicotine dependence: Secondary | ICD-10-CM | POA: Diagnosis not present

## 2021-09-11 DIAGNOSIS — Z48811 Encounter for surgical aftercare following surgery on the nervous system: Secondary | ICD-10-CM | POA: Diagnosis not present

## 2021-09-11 DIAGNOSIS — K5900 Constipation, unspecified: Secondary | ICD-10-CM | POA: Diagnosis not present

## 2021-09-11 DIAGNOSIS — I10 Essential (primary) hypertension: Secondary | ICD-10-CM | POA: Diagnosis not present

## 2021-09-11 DIAGNOSIS — E785 Hyperlipidemia, unspecified: Secondary | ICD-10-CM | POA: Diagnosis not present

## 2021-09-11 DIAGNOSIS — E1165 Type 2 diabetes mellitus with hyperglycemia: Secondary | ICD-10-CM | POA: Diagnosis not present

## 2021-09-11 DIAGNOSIS — E1142 Type 2 diabetes mellitus with diabetic polyneuropathy: Secondary | ICD-10-CM | POA: Diagnosis not present

## 2021-09-11 DIAGNOSIS — D62 Acute posthemorrhagic anemia: Secondary | ICD-10-CM | POA: Diagnosis not present

## 2021-09-11 DIAGNOSIS — N179 Acute kidney failure, unspecified: Secondary | ICD-10-CM | POA: Diagnosis not present

## 2021-09-11 DIAGNOSIS — Z9181 History of falling: Secondary | ICD-10-CM | POA: Diagnosis not present

## 2021-09-11 NOTE — Telephone Encounter (Signed)
VO given.

## 2021-09-11 NOTE — Telephone Encounter (Signed)
Okay for VO? Pt has hospital f/u on 12/7.

## 2021-09-11 NOTE — Telephone Encounter (Signed)
It is ok to give verbal authorization to requested services. Thanks, BJ 

## 2021-09-11 NOTE — Telephone Encounter (Signed)
Will occupational therapist is requesting verbal orders OT 1 x 3  Call back # [phone]

## 2021-09-12 DIAGNOSIS — R0902 Hypoxemia: Secondary | ICD-10-CM | POA: Diagnosis not present

## 2021-09-12 DIAGNOSIS — I10 Essential (primary) hypertension: Secondary | ICD-10-CM | POA: Diagnosis not present

## 2021-09-12 DIAGNOSIS — R1111 Vomiting without nausea: Secondary | ICD-10-CM | POA: Diagnosis not present

## 2021-09-12 DIAGNOSIS — R110 Nausea: Secondary | ICD-10-CM | POA: Diagnosis not present

## 2021-09-13 ENCOUNTER — Other Ambulatory Visit: Payer: Self-pay

## 2021-09-13 ENCOUNTER — Telehealth: Payer: Self-pay

## 2021-09-13 ENCOUNTER — Ambulatory Visit: Payer: Medicare Other

## 2021-09-13 ENCOUNTER — Encounter: Payer: Self-pay | Admitting: Family Medicine

## 2021-09-13 ENCOUNTER — Ambulatory Visit (INDEPENDENT_AMBULATORY_CARE_PROVIDER_SITE_OTHER): Payer: Medicare Other | Admitting: Family Medicine

## 2021-09-13 VITALS — BP 110/68 | HR 91 | Temp 98.6°F | Resp 16 | Ht 72.0 in

## 2021-09-13 DIAGNOSIS — Z9181 History of falling: Secondary | ICD-10-CM | POA: Diagnosis not present

## 2021-09-13 DIAGNOSIS — Z48811 Encounter for surgical aftercare following surgery on the nervous system: Secondary | ICD-10-CM | POA: Diagnosis not present

## 2021-09-13 DIAGNOSIS — K5900 Constipation, unspecified: Secondary | ICD-10-CM | POA: Diagnosis not present

## 2021-09-13 DIAGNOSIS — E785 Hyperlipidemia, unspecified: Secondary | ICD-10-CM | POA: Diagnosis not present

## 2021-09-13 DIAGNOSIS — Z1159 Encounter for screening for other viral diseases: Secondary | ICD-10-CM | POA: Diagnosis not present

## 2021-09-13 DIAGNOSIS — I10 Essential (primary) hypertension: Secondary | ICD-10-CM | POA: Diagnosis not present

## 2021-09-13 DIAGNOSIS — N179 Acute kidney failure, unspecified: Secondary | ICD-10-CM | POA: Diagnosis not present

## 2021-09-13 DIAGNOSIS — E1165 Type 2 diabetes mellitus with hyperglycemia: Secondary | ICD-10-CM | POA: Diagnosis not present

## 2021-09-13 DIAGNOSIS — E876 Hypokalemia: Secondary | ICD-10-CM

## 2021-09-13 DIAGNOSIS — S82851A Displaced trimalleolar fracture of right lower leg, initial encounter for closed fracture: Secondary | ICD-10-CM | POA: Diagnosis not present

## 2021-09-13 DIAGNOSIS — R062 Wheezing: Secondary | ICD-10-CM

## 2021-09-13 DIAGNOSIS — R5381 Other malaise: Secondary | ICD-10-CM | POA: Diagnosis not present

## 2021-09-13 DIAGNOSIS — E1142 Type 2 diabetes mellitus with diabetic polyneuropathy: Secondary | ICD-10-CM | POA: Diagnosis not present

## 2021-09-13 DIAGNOSIS — E1140 Type 2 diabetes mellitus with diabetic neuropathy, unspecified: Secondary | ICD-10-CM | POA: Diagnosis not present

## 2021-09-13 DIAGNOSIS — D62 Acute posthemorrhagic anemia: Secondary | ICD-10-CM | POA: Diagnosis not present

## 2021-09-13 DIAGNOSIS — Z87891 Personal history of nicotine dependence: Secondary | ICD-10-CM | POA: Diagnosis not present

## 2021-09-13 DIAGNOSIS — G9520 Unspecified cord compression: Secondary | ICD-10-CM

## 2021-09-13 DIAGNOSIS — I2699 Other pulmonary embolism without acute cor pulmonale: Secondary | ICD-10-CM | POA: Diagnosis not present

## 2021-09-13 LAB — CBC
HCT: 32.6 % — ABNORMAL LOW (ref 39.0–52.0)
Hemoglobin: 10.5 g/dL — ABNORMAL LOW (ref 13.0–17.0)
MCHC: 32.3 g/dL (ref 30.0–36.0)
MCV: 93.4 fl (ref 78.0–100.0)
Platelets: 334 10*3/uL (ref 150.0–400.0)
RBC: 3.49 Mil/uL — ABNORMAL LOW (ref 4.22–5.81)
RDW: 16.1 % — ABNORMAL HIGH (ref 11.5–15.5)
WBC: 15.6 10*3/uL — ABNORMAL HIGH (ref 4.0–10.5)

## 2021-09-13 LAB — BASIC METABOLIC PANEL
BUN: 43 mg/dL — ABNORMAL HIGH (ref 6–23)
CO2: 26 mEq/L (ref 19–32)
Calcium: 10.3 mg/dL (ref 8.4–10.5)
Chloride: 105 mEq/L (ref 96–112)
Creatinine, Ser: 1.91 mg/dL — ABNORMAL HIGH (ref 0.40–1.50)
GFR: 35.41 mL/min — ABNORMAL LOW (ref >60.00)
Glucose, Bld: 112 mg/dL — ABNORMAL HIGH (ref 70–99)
Potassium: 3.6 mEq/L (ref 3.5–5.1)
Sodium: 139 mEq/L (ref 135–145)

## 2021-09-13 LAB — HEMOGLOBIN A1C: Hgb A1c MFr Bld: 6 % (ref 4.6–6.5)

## 2021-09-13 NOTE — Patient Instructions (Addendum)
A few things to remember from today's visit:   Encounter for HCV screening test for low risk patient - Plan: Hepatitis C antibody  Type 2 diabetes mellitus with diabetic neuropathy, without long-term current use of insulin ([place]) - Plan: Hemoglobin A1c  Wheezing - Plan: DG Chest 2 View  Essential hypertension - Plan: Basic metabolic panel, CBC  Constipation, unspecified constipation type  Acute blood loss anemia ([facility])  If you need refills please call your pharmacy. Do not use My Chart to request refills or for acute issues that need immediate attention.   Miralax at night as needed for constipation, avoid straining. No changes in medications today. Keep appt with ortho and pain management.  Please be sure medication list is accurate. If a new problem present, please set up appointment sooner than planned today. Diabetes Mellitus and Nutrition, Adult When you have diabetes, or diabetes mellitus, it is very important to have healthy eating habits because your blood sugar (glucose) levels are greatly affected by what you eat and drink. Eating healthy foods in the right amounts, at about the same times every day, can help you: Manage your blood glucose. Lower your risk of heart disease. Improve your blood pressure. Reach or maintain a healthy weight. What can affect my meal plan? Every person with diabetes is different, and each person has different needs for a meal plan. Your health care provider may recommend that you work with a dietitian to make a meal plan that is best for you. Your meal plan may vary depending on factors such as: The calories you need. The medicines you take. Your weight. Your blood glucose, blood pressure, and cholesterol levels. Your activity level. Other health conditions you have, such as heart or kidney disease. How do carbohydrates affect me? Carbohydrates, also called carbs, affect your blood glucose level more than any other type of food. Eating carbs  raises the amount of glucose in your blood. It is important to know how many carbs you can safely have in each meal. This is different for every person. Your dietitian can help you calculate how many carbs you should have at each meal and for each snack. How does alcohol affect me? Alcohol can cause a decrease in blood glucose (hypoglycemia), especially if you use insulin or take certain diabetes medicines by mouth. Hypoglycemia can be a life-threatening condition. Symptoms of hypoglycemia, such as sleepiness, dizziness, and confusion, are similar to symptoms of having too much alcohol. Do not drink alcohol if: Your health care provider tells you not to drink. You are pregnant, may be pregnant, or are planning to become pregnant. If you drink alcohol: Limit how much you have to: 0-1 drink a day for women. 0-2 drinks a day for men. Know how much alcohol is in your drink. In the [place] one drink equals one 12 oz bottle of beer (355 mL), one 5 oz glass of wine (148 mL), or one 1 oz glass of hard liquor (44 mL). Keep yourself hydrated with water, diet soda, or unsweetened iced tea. Keep in mind that regular soda, juice, and other mixers may contain a lot of sugar and must be counted as carbs. What are tips for following this plan? Reading food labels Start by checking the serving size on the Nutrition Facts label of packaged foods and drinks. The number of calories and the amount of carbs, fats, and other nutrients listed on the label are based on one serving of the item. Many items contain more than one serving  per package. Check the total grams (g) of carbs in one serving. Check the number of grams of saturated fats and trans fats in one serving. Choose foods that have a low amount or none of these fats. Check the number of milligrams (mg) of salt (sodium) in one serving. Most people should limit total sodium intake to less than 2,300 mg per day. Always check the nutrition information of foods  labeled as "low-fat" or "nonfat." These foods may be higher in added sugar or refined carbs and should be avoided. Talk to your dietitian to identify your daily goals for nutrients listed on the label. Shopping Avoid buying canned, pre-made, or processed foods. These foods tend to be high in fat, sodium, and added sugar. Shop around the outside edge of the grocery store. This is where you will most often find fresh fruits and vegetables, bulk grains, fresh meats, and fresh dairy products. Cooking Use low-heat cooking methods, such as baking, instead of high-heat cooking methods, such as deep frying. Cook using healthy oils, such as olive, canola, or sunflower oil. Avoid cooking with butter, cream, or high-fat meats. Meal planning Eat meals and snacks regularly, preferably at the same times every day. Avoid going long periods of time without eating. Eat foods that are high in fiber, such as fresh fruits, vegetables, beans, and whole grains. Eat 4-6 oz (112-168 g) of lean protein each day, such as lean meat, chicken, fish, eggs, or tofu. One ounce (oz) (28 g) of lean protein is equal to: 1 oz (28 g) of meat, chicken, or fish. 1 egg.  cup (62 g) of tofu. Eat some foods each day that contain healthy fats, such as avocado, nuts, seeds, and fish. What foods should I eat? Fruits Berries. Apples. Oranges. Peaches. Apricots. Plums. Grapes. Mangoes. Papayas. Pomegranates. Kiwi. Cherries. Vegetables Leafy greens, including lettuce, spinach, kale, chard, collard greens, mustard greens, and cabbage. Beets. Cauliflower. Broccoli. Carrots. Green beans. Tomatoes. Peppers. Onions. Cucumbers. Brussels sprouts. Grains Whole grains, such as whole-wheat or whole-grain bread, crackers, tortillas, cereal, and pasta. Unsweetened oatmeal. Quinoa. Brown or wild rice. Meats and other proteins Seafood. Poultry without skin. Lean cuts of poultry and beef. Tofu. Nuts. Seeds. Dairy Low-fat or fat-free dairy products  such as milk, yogurt, and cheese. The items listed above may not be a complete list of foods and beverages you can eat and drink. Contact a dietitian for more information. What foods should I avoid? Fruits Fruits canned with syrup. Vegetables Canned vegetables. Frozen vegetables with butter or cream sauce. Grains Refined white flour and flour products such as bread, pasta, snack foods, and cereals. Avoid all processed foods. Meats and other proteins Fatty cuts of meat. Poultry with skin. Breaded or fried meats. Processed meat. Avoid saturated fats. Dairy Full-fat yogurt, cheese, or milk. Beverages Sweetened drinks, such as soda or iced tea. The items listed above may not be a complete list of foods and beverages you should avoid. Contact a dietitian for more information. Questions to ask a health care provider Do I need to meet with a certified diabetes care and education specialist? Do I need to meet with a dietitian? What number can I call if I have questions? When are the best times to check my blood glucose? Where to find more information: American Diabetes Association: diabetes.org Academy of Nutrition and Dietetics: eatright.[employer] of Diabetes and Digestive and Kidney Diseases: [employer] Association of Diabetes Care & Education Specialists: diabeteseducator.org Summary It is important to have healthy eating habits because  your blood sugar (glucose) levels are greatly affected by what you eat and drink. It is important to use alcohol carefully. A healthy meal plan will help you manage your blood glucose and lower your risk of heart disease. Your health care provider may recommend that you work with a dietitian to make a meal plan that is best for you. This information is not intended to replace advice given to you by your health care provider. Make sure you discuss any questions you have with your health care provider. Document Revised: 04/27/2020 Document  Reviewed: 04/27/2020 Elsevier Patient Education  Keith [name].

## 2021-09-13 NOTE — Progress Notes (Signed)
HPI: [name] is a 70 y.o. male, who is here today to follow on recent hospitalization. Hospitalized from 08/21/21 to 09/06/21, inpt rehabilitation.  Discharge Diagnoses:  Principal Problem:   Thoracic myelopathy Active Problems:   Essential hypertension   AKI (acute kidney injury) ([place])   Diabetic peripheral neuropathy (HCC)   Acute blood loss anemia   Controlled type 2 diabetes mellitus with hyperglycemia, without long-term current use of insulin ([place]).  Transition of care tel call on 09/07/21.  After having lumbar MRI because of progressive LE weakness and back pain, he was taken to the OR for T11-T12 laminectomy posterior fusion by Dr. Venetia Constable on 11/02.  Postop has had improvement in BLE strength but continues to be limited by balance deficits, sensory deficits as well as decreased endurance.  PT and OT through [facility]. PT x 2/week and OT 1/week. At home he is using a walker and wheel chair. He was having left-sided back pain, greatly improved after trigger point injection x 2. A hospital bed was prescribed, he has been contacted by supplier but it has not been delivered.  Diazepam recommended, he does not like how he feels when he takes it. It was recommended Diazepam 5 mg at bedtime and 2 mg before PT. Also taking Gabapentin 600 mg tid,Duloxetine 30 mg daily, and Keppra 250 mg bid.  He is to follow with physical medicine rehab.  Appetite is "too good", has gained some wt.  Anemia, he is not on iron supplementation.  Lab Results  Component Value Date   WBC 4.4 09/04/2021   HGB 9.8 (L) 09/04/2021   HCT 30.0 (L) 09/04/2021   MCV 95.8 09/04/2021   PLT 303 09/04/2021   His wife is concerned about constipation, he denies changes in bowel movements, daily. Straining sometimes. Denies blood in stool.  HypoK+: He is on KLOR 20 meq daily. HTN: He is on Clonidine 0.1 mg daily and Atenolol-HCTZ 50-25 mg daily.  Lab Results  Component Value Date   CREATININE 1.31  (H) 09/04/2021   BUN 19 09/04/2021   NA 139 09/04/2021   K 3.8 09/04/2021   CL 106 09/04/2021   CO2 25 09/04/2021   DM II: Metformin was discontinued. Not checking BS's.  Lab Results  Component Value Date   HGBA1C 6.2 (H) 11/21/2020   Lab Results  Component Value Date   MICROALBUR 8.1 (H) 03/30/2020   MICROALBUR 7.4 (H) 11/19/2018   Yesterday he had episode of acid reflux,abdominal pain,nausea,and vomiting after drinking a milkshake and eating shellfish, symptoms resolved after vomiting. EMG performed evaluation at home, had an EKG done. He was given supplemental O2 for a few minutes because he was coughing and wheezing, these have also resolved.  Review of Systems  Constitutional:  Positive for activity change, appetite change and fatigue.  HENT:  Negative for mouth sores and sore throat.   Respiratory:  Negative for shortness of breath.   Cardiovascular:  Positive for leg swelling (stable). Negative for chest pain and palpitations.  Gastrointestinal:  Negative for abdominal distention and rectal pain.  Endocrine: Negative for polydipsia, polyphagia and polyuria.  Genitourinary:  Negative for decreased urine volume, dysuria and hematuria.  Neurological:  Negative for syncope and headaches.  Rest see pertinent positives and negatives per HPI.  Current Outpatient Medications on File Prior to Visit  Medication Sig Dispense Refill   acetaminophen (TYLENOL) 325 MG tablet Take 1-2 tablets (325-650 mg total) by mouth every 4 (four) hours as needed for mild pain.  diazepam (VALIUM) 2 MG tablet Take 1 tablet (2 mg total) by mouth in the morning. 30 tablet 0   diazepam (VALIUM) 5 MG tablet Take 1 tablet (5 mg total) by mouth at bedtime. 30 tablet 0   DULoxetine (CYMBALTA) 30 MG capsule Take 2 capsules (60 mg total) by mouth daily. 30 capsule 0   gabapentin (NEURONTIN) 600 MG tablet Take 1 tablet (600 mg total) by mouth 3 (three) times daily. 90 tablet 0   levETIRAcetam (KEPPRA) 250  MG tablet Take 1 tablet (250 mg total) by mouth 2 (two) times daily. 60 tablet 0   lidocaine (LIDODERM) 5 % Place 2 patches onto the skin daily. Apply at 8 pm and remove at 8 am daily. 60 patch 0   Multiple Vitamin (MULITIVITAMIN WITH MINERALS) TABS Take 1 tablet by mouth daily with breakfast.     oxyCODONE-acetaminophen (PERCOCET) 7.5-325 MG tablet Take 2 tablets by mouth every 6 (six) hours as needed for severe pain. Note that 120 pills were filled on 10/31--day prior to hospital admission. Taking this four times a day should be equal to using 10 mg three times a day as in the hospital. Can wean down as able. 30 tablet 0   polyethylene glycol (MIRALAX / GLYCOLAX) 17 g packet Take 17 g by mouth daily as needed for mild constipation.     traZODone (DESYREL) 50 MG tablet Take 0.5-1 tablets (25-50 mg total) by mouth at bedtime as needed for sleep. 30 tablet 0   potassium chloride SA (KLOR-CON) 20 MEQ tablet TAKE 1 TABLET (20 MEQ TOTAL) BY MOUTH DAILY. (Patient taking differently: Take 20 mEq by mouth daily.) 30 tablet 0   [DISCONTINUED] apixaban (ELIQUIS) 5 MG TABS tablet Take 1 tablet (5 mg total) by mouth 2 (two) times daily. (Patient not taking: No sig reported) 60 tablet 01   No current facility-administered medications on file prior to visit.   Past Medical History:  Diagnosis Date   Allergy    Chronic back pain    Diabetes mellitus without complication ([place])    DNR (do not resuscitate) 11/19/2020   Duodenal ulcer hemorrhage 08/29/2014   ED (erectile dysfunction)    Esophageal stricture 08/30/2014   Glucose intolerance (impaired glucose tolerance)    Hiatal hernia 08/30/2014   Hypertension    Hypokalemia 04/11/2013   MRSA carrier 08/30/2014   Obesity    Osteoarthritis    Pneumonia    Pulmonary embolism ([place]) 06/2020   Scoliosis 2016   Spinal stenosis of lumbar region    Urinary tract infection 04/11/2013   Allergies  Allergen Reactions   Aspirin Other (See Comments)    Irritates  the stomach and the patient developed ulcers, also   Lisinopril Other (See Comments)    Caused a body ache    Social History   Socioeconomic History   Marital status: Married    Spouse name: Not on file   Number of children: Not on file   Years of education: Not on file   Highest education level: Not on file  Occupational History   Occupation: retired  Tobacco Use   Smoking status: Former    Packs/day: 1.00    Years: 25.00    Pack years: 25.00    Types: Cigarettes    Quit date: 1995    Years since quitting: 27.9   Smokeless tobacco: Never  Vaping Use   Vaping Use: Never used  Substance and Sexual Activity   Alcohol use: Yes    Comment:  holidays and special occasions   Drug use: No   Sexual activity: Yes    Birth control/protection: None  Other Topics Concern   Not on file  Social History Narrative   Married.     Social Determinants of Health   Financial Resource Strain: Low Risk    Difficulty of Paying Living Expenses: Not hard at all  Food Insecurity: No Food Insecurity   Worried About Charity fundraiser in the Last Year: Never true   [place] in the Last Year: Never true  Transportation Needs: No Transportation Needs   Lack of Transportation (Medical): No   Lack of Transportation (Non-Medical): No  Physical Activity: Sufficiently Active   Days of Exercise per Week: 5 days   Minutes of Exercise per Session: 60 min  Stress: No Stress Concern Present   Feeling of Stress : Not at all  Social Connections: Moderately Integrated   Frequency of Communication with Friends and Family: Three times a week   Frequency of Social Gatherings with Friends and Family: Three times a week   Attends Religious Services: More than 4 times per year   Active Member of Clubs or Organizations: No   Attends Archivist Meetings: Never   Marital Status: Married   Vitals:   09/13/21 0926  BP: 110/68  Pulse: 91  Resp: 16  Temp: 98.6 F (37 C)  SpO2: 92%   Body  mass index is 37.61 kg/m.  Physical Exam Nursing note reviewed.  Constitutional:      General: He is not in acute distress.    Appearance: He is well-developed.  HENT:     Head: Normocephalic and atraumatic.     Mouth/Throat:     Mouth: Mucous membranes are dry.  Eyes:     Conjunctiva/sclera: Conjunctivae normal.  Cardiovascular:     Rate and Rhythm: Normal rate and regular rhythm.     Heart sounds: No murmur heard.    Comments: Compression stockings on. Pulmonary:     Effort: Pulmonary effort is normal. No respiratory distress.     Breath sounds: No stridor. Examination of the left-upper field reveals wheezing. Examination of the left-lower field reveals wheezing. Wheezing (Mild) present. No rhonchi or rales.  Abdominal:     Palpations: Abdomen is soft.     Tenderness: There is no abdominal tenderness.  Musculoskeletal:     Right lower leg: 1+ Pitting Edema present.     Left lower leg: 1+ Pitting Edema present.  Lymphadenopathy:     Cervical: No cervical adenopathy.  Skin:    General: Skin is warm.     Findings: No erythema or rash.  Neurological:     Mental Status: He is alert and oriented to person, place, and time.     Cranial Nerves: No cranial nerve deficit.     Comments: In a wheel chair.  Psychiatric:     Comments: Well groomed, good eye contact.   ASSESSMENT AND PLAN:  [name] was seen today for hospitalization follow-up.  Diagnoses and all orders for this visit: Orders Placed This Encounter  Procedures   DG Chest 2 View   DG Chest 2 View   Basic metabolic panel   CBC   Hemoglobin A1c   Hepatitis C antibody   Lab Results  Component Value Date   WBC 15.6 (H) 09/13/2021   HGB 10.5 (L) 09/13/2021   HCT 32.6 (L) 09/13/2021   MCV 93.4 09/13/2021   PLT 334.0 09/13/2021   Lab  Results  Component Value Date   CREATININE 1.91 (H) 09/13/2021   BUN 43 (H) 09/13/2021   NA 139 09/13/2021   K 3.6 09/13/2021   CL 105 09/13/2021   CO2 26 09/13/2021    Lab Results  Component Value Date   HGBA1C 6.0 09/13/2021   Constipation, unspecified constipation type Opioids can aggravate problem. Adequate hydration and fiber intake. Miralax at night prn recommended.  Type 2 diabetes mellitus with diabetic neuropathy, without long-term current use of insulin ([place]) Continue non pharmacologic treatment. Appropriate foot care and regular eye exam to continue. Further recommendations according to HgA1C.  Wheezing Mild. He is not having respiratory symptoms. Former smoker. Further recommendations according to CXR result.  Essential hypertension BP adequately controlled. Continue Clonidine 0.1 mg daily and Atenolol-HCTZ 50-25 mg daily. Low salt diet recommended. Monitor BP at home.  Acute blood loss anemia ([place]) Not on iron supplementation. Colonoscopy in 01/2018. Further recommendations according to CBC results.  Hypokalemia Continue KLOR 20 meq daily.  Spinal cord compression (HCC) S/P T11-T12 decompression. Next f/u appt 11/08/20. Completed inpt rehab and starting home PT.  Encounter for HCV screening test for low risk patient -     Hepatitis C antibody  Return in about 4 months (around 01/12/2022).  Joye Wesenberg G. [name], MD  [facility]. [place] office.

## 2021-09-13 NOTE — Telephone Encounter (Signed)
Katie PTA from [facility] called to report patient o2 levels 88 - 90% with movement experienced wheezy. Patient needs appt to x-ray lungs  Call back # [phone]

## 2021-09-13 NOTE — Telephone Encounter (Signed)
I called and spoke with Keith [name]. I informed her that pt was seen this morning & an x-ray was ordered. She will let the pt know that he can go over to [place] to have this x-ray done. It was not able to be performed in our office due to the limited mobility/seating.

## 2021-09-14 ENCOUNTER — Telehealth: Payer: Self-pay

## 2021-09-14 ENCOUNTER — Other Ambulatory Visit: Payer: Self-pay | Admitting: Family Medicine

## 2021-09-14 DIAGNOSIS — I10 Essential (primary) hypertension: Secondary | ICD-10-CM

## 2021-09-14 LAB — HEPATITIS C ANTIBODY
Hepatitis C Ab: NONREACTIVE
SIGNAL TO CUT-OFF: 0.1 (ref <1.00)

## 2021-09-14 NOTE — Telephone Encounter (Signed)
Charrise from [facility] called asking to increase visit for SOB  frequency 3 x3  1 x1  Call back # [phone]

## 2021-09-15 ENCOUNTER — Other Ambulatory Visit: Payer: Self-pay | Admitting: Family Medicine

## 2021-09-15 DIAGNOSIS — M4714 Other spondylosis with myelopathy, thoracic region: Secondary | ICD-10-CM | POA: Diagnosis not present

## 2021-09-15 NOTE — Telephone Encounter (Signed)
VO left on Keith [name]'s voicemail.

## 2021-09-16 DIAGNOSIS — Z48811 Encounter for surgical aftercare following surgery on the nervous system: Secondary | ICD-10-CM | POA: Diagnosis not present

## 2021-09-16 DIAGNOSIS — K5900 Constipation, unspecified: Secondary | ICD-10-CM | POA: Diagnosis not present

## 2021-09-16 DIAGNOSIS — E785 Hyperlipidemia, unspecified: Secondary | ICD-10-CM | POA: Diagnosis not present

## 2021-09-16 DIAGNOSIS — E1165 Type 2 diabetes mellitus with hyperglycemia: Secondary | ICD-10-CM | POA: Diagnosis not present

## 2021-09-16 DIAGNOSIS — N179 Acute kidney failure, unspecified: Secondary | ICD-10-CM | POA: Diagnosis not present

## 2021-09-16 DIAGNOSIS — Z87891 Personal history of nicotine dependence: Secondary | ICD-10-CM | POA: Diagnosis not present

## 2021-09-16 DIAGNOSIS — E1142 Type 2 diabetes mellitus with diabetic polyneuropathy: Secondary | ICD-10-CM | POA: Diagnosis not present

## 2021-09-16 DIAGNOSIS — I10 Essential (primary) hypertension: Secondary | ICD-10-CM | POA: Diagnosis not present

## 2021-09-16 DIAGNOSIS — Z9181 History of falling: Secondary | ICD-10-CM | POA: Diagnosis not present

## 2021-09-16 DIAGNOSIS — D62 Acute posthemorrhagic anemia: Secondary | ICD-10-CM | POA: Diagnosis not present

## 2021-09-18 DIAGNOSIS — E785 Hyperlipidemia, unspecified: Secondary | ICD-10-CM | POA: Diagnosis not present

## 2021-09-18 DIAGNOSIS — N179 Acute kidney failure, unspecified: Secondary | ICD-10-CM | POA: Diagnosis not present

## 2021-09-18 DIAGNOSIS — D62 Acute posthemorrhagic anemia: Secondary | ICD-10-CM | POA: Diagnosis not present

## 2021-09-18 DIAGNOSIS — Z48811 Encounter for surgical aftercare following surgery on the nervous system: Secondary | ICD-10-CM | POA: Diagnosis not present

## 2021-09-18 DIAGNOSIS — Z9181 History of falling: Secondary | ICD-10-CM | POA: Diagnosis not present

## 2021-09-18 DIAGNOSIS — K5900 Constipation, unspecified: Secondary | ICD-10-CM | POA: Diagnosis not present

## 2021-09-18 DIAGNOSIS — E1142 Type 2 diabetes mellitus with diabetic polyneuropathy: Secondary | ICD-10-CM | POA: Diagnosis not present

## 2021-09-18 DIAGNOSIS — Z87891 Personal history of nicotine dependence: Secondary | ICD-10-CM | POA: Diagnosis not present

## 2021-09-18 DIAGNOSIS — E1165 Type 2 diabetes mellitus with hyperglycemia: Secondary | ICD-10-CM | POA: Diagnosis not present

## 2021-09-18 DIAGNOSIS — I10 Essential (primary) hypertension: Secondary | ICD-10-CM | POA: Diagnosis not present

## 2021-09-18 NOTE — Addendum Note (Signed)
Addended by: Rodrigo Ran on: 09/18/2021 09:41 AM   Modules accepted: Orders

## 2021-09-19 ENCOUNTER — Ambulatory Visit (INDEPENDENT_AMBULATORY_CARE_PROVIDER_SITE_OTHER)
Admission: RE | Admit: 2021-09-19 | Discharge: 2021-09-19 | Disposition: A | Discharge status: Home / Self Care | Payer: Medicare Other | Source: Ambulatory Visit | Attending: Family Medicine | Admitting: Family Medicine

## 2021-09-19 ENCOUNTER — Other Ambulatory Visit: Payer: Self-pay

## 2021-09-19 DIAGNOSIS — R062 Wheezing: Secondary | ICD-10-CM | POA: Diagnosis not present

## 2021-09-19 DIAGNOSIS — R1110 Vomiting, unspecified: Secondary | ICD-10-CM | POA: Diagnosis not present

## 2021-09-19 DIAGNOSIS — R059 Cough, unspecified: Secondary | ICD-10-CM | POA: Diagnosis not present

## 2021-09-20 DIAGNOSIS — I10 Essential (primary) hypertension: Secondary | ICD-10-CM | POA: Diagnosis not present

## 2021-09-20 DIAGNOSIS — E1142 Type 2 diabetes mellitus with diabetic polyneuropathy: Secondary | ICD-10-CM | POA: Diagnosis not present

## 2021-09-20 DIAGNOSIS — D62 Acute posthemorrhagic anemia: Secondary | ICD-10-CM | POA: Diagnosis not present

## 2021-09-20 DIAGNOSIS — E1165 Type 2 diabetes mellitus with hyperglycemia: Secondary | ICD-10-CM | POA: Diagnosis not present

## 2021-09-20 DIAGNOSIS — K5900 Constipation, unspecified: Secondary | ICD-10-CM | POA: Diagnosis not present

## 2021-09-20 DIAGNOSIS — Z9181 History of falling: Secondary | ICD-10-CM | POA: Diagnosis not present

## 2021-09-20 DIAGNOSIS — Z48811 Encounter for surgical aftercare following surgery on the nervous system: Secondary | ICD-10-CM | POA: Diagnosis not present

## 2021-09-20 DIAGNOSIS — Z87891 Personal history of nicotine dependence: Secondary | ICD-10-CM | POA: Diagnosis not present

## 2021-09-20 DIAGNOSIS — N179 Acute kidney failure, unspecified: Secondary | ICD-10-CM | POA: Diagnosis not present

## 2021-09-20 DIAGNOSIS — E785 Hyperlipidemia, unspecified: Secondary | ICD-10-CM | POA: Diagnosis not present

## 2021-09-22 DIAGNOSIS — Z48811 Encounter for surgical aftercare following surgery on the nervous system: Secondary | ICD-10-CM | POA: Diagnosis not present

## 2021-09-22 DIAGNOSIS — Z9181 History of falling: Secondary | ICD-10-CM | POA: Diagnosis not present

## 2021-09-22 DIAGNOSIS — Z87891 Personal history of nicotine dependence: Secondary | ICD-10-CM | POA: Diagnosis not present

## 2021-09-22 DIAGNOSIS — I10 Essential (primary) hypertension: Secondary | ICD-10-CM | POA: Diagnosis not present

## 2021-09-22 DIAGNOSIS — E785 Hyperlipidemia, unspecified: Secondary | ICD-10-CM | POA: Diagnosis not present

## 2021-09-22 DIAGNOSIS — E1165 Type 2 diabetes mellitus with hyperglycemia: Secondary | ICD-10-CM | POA: Diagnosis not present

## 2021-09-22 DIAGNOSIS — N179 Acute kidney failure, unspecified: Secondary | ICD-10-CM | POA: Diagnosis not present

## 2021-09-22 DIAGNOSIS — K5900 Constipation, unspecified: Secondary | ICD-10-CM | POA: Diagnosis not present

## 2021-09-22 DIAGNOSIS — D62 Acute posthemorrhagic anemia: Secondary | ICD-10-CM | POA: Diagnosis not present

## 2021-09-22 DIAGNOSIS — E1142 Type 2 diabetes mellitus with diabetic polyneuropathy: Secondary | ICD-10-CM | POA: Diagnosis not present

## 2021-09-25 DIAGNOSIS — Z9181 History of falling: Secondary | ICD-10-CM | POA: Diagnosis not present

## 2021-09-25 DIAGNOSIS — D62 Acute posthemorrhagic anemia: Secondary | ICD-10-CM | POA: Diagnosis not present

## 2021-09-25 DIAGNOSIS — E1142 Type 2 diabetes mellitus with diabetic polyneuropathy: Secondary | ICD-10-CM | POA: Diagnosis not present

## 2021-09-25 DIAGNOSIS — K5900 Constipation, unspecified: Secondary | ICD-10-CM | POA: Diagnosis not present

## 2021-09-25 DIAGNOSIS — E785 Hyperlipidemia, unspecified: Secondary | ICD-10-CM | POA: Diagnosis not present

## 2021-09-25 DIAGNOSIS — I10 Essential (primary) hypertension: Secondary | ICD-10-CM | POA: Diagnosis not present

## 2021-09-25 DIAGNOSIS — E1165 Type 2 diabetes mellitus with hyperglycemia: Secondary | ICD-10-CM | POA: Diagnosis not present

## 2021-09-25 DIAGNOSIS — N179 Acute kidney failure, unspecified: Secondary | ICD-10-CM | POA: Diagnosis not present

## 2021-09-25 DIAGNOSIS — Z87891 Personal history of nicotine dependence: Secondary | ICD-10-CM | POA: Diagnosis not present

## 2021-09-25 DIAGNOSIS — Z48811 Encounter for surgical aftercare following surgery on the nervous system: Secondary | ICD-10-CM | POA: Diagnosis not present

## 2021-09-27 DIAGNOSIS — Z87891 Personal history of nicotine dependence: Secondary | ICD-10-CM | POA: Diagnosis not present

## 2021-09-27 DIAGNOSIS — K5900 Constipation, unspecified: Secondary | ICD-10-CM | POA: Diagnosis not present

## 2021-09-27 DIAGNOSIS — I10 Essential (primary) hypertension: Secondary | ICD-10-CM | POA: Diagnosis not present

## 2021-09-27 DIAGNOSIS — N179 Acute kidney failure, unspecified: Secondary | ICD-10-CM | POA: Diagnosis not present

## 2021-09-27 DIAGNOSIS — Z48811 Encounter for surgical aftercare following surgery on the nervous system: Secondary | ICD-10-CM | POA: Diagnosis not present

## 2021-09-27 DIAGNOSIS — E785 Hyperlipidemia, unspecified: Secondary | ICD-10-CM | POA: Diagnosis not present

## 2021-09-27 DIAGNOSIS — E1165 Type 2 diabetes mellitus with hyperglycemia: Secondary | ICD-10-CM | POA: Diagnosis not present

## 2021-09-27 DIAGNOSIS — E1142 Type 2 diabetes mellitus with diabetic polyneuropathy: Secondary | ICD-10-CM | POA: Diagnosis not present

## 2021-09-27 DIAGNOSIS — D62 Acute posthemorrhagic anemia: Secondary | ICD-10-CM | POA: Diagnosis not present

## 2021-09-27 DIAGNOSIS — Z9181 History of falling: Secondary | ICD-10-CM | POA: Diagnosis not present

## 2021-09-28 ENCOUNTER — Other Ambulatory Visit: Payer: Self-pay | Admitting: Physical Medicine and Rehabilitation

## 2021-09-29 DIAGNOSIS — K5900 Constipation, unspecified: Secondary | ICD-10-CM | POA: Diagnosis not present

## 2021-09-29 DIAGNOSIS — E785 Hyperlipidemia, unspecified: Secondary | ICD-10-CM | POA: Diagnosis not present

## 2021-09-29 DIAGNOSIS — D62 Acute posthemorrhagic anemia: Secondary | ICD-10-CM | POA: Diagnosis not present

## 2021-09-29 DIAGNOSIS — Z48811 Encounter for surgical aftercare following surgery on the nervous system: Secondary | ICD-10-CM | POA: Diagnosis not present

## 2021-09-29 DIAGNOSIS — Z87891 Personal history of nicotine dependence: Secondary | ICD-10-CM | POA: Diagnosis not present

## 2021-09-29 DIAGNOSIS — E1142 Type 2 diabetes mellitus with diabetic polyneuropathy: Secondary | ICD-10-CM | POA: Diagnosis not present

## 2021-09-29 DIAGNOSIS — N179 Acute kidney failure, unspecified: Secondary | ICD-10-CM | POA: Diagnosis not present

## 2021-09-29 DIAGNOSIS — I10 Essential (primary) hypertension: Secondary | ICD-10-CM | POA: Diagnosis not present

## 2021-09-29 DIAGNOSIS — E1165 Type 2 diabetes mellitus with hyperglycemia: Secondary | ICD-10-CM | POA: Diagnosis not present

## 2021-09-29 DIAGNOSIS — Z9181 History of falling: Secondary | ICD-10-CM | POA: Diagnosis not present

## 2021-10-03 DIAGNOSIS — E1165 Type 2 diabetes mellitus with hyperglycemia: Secondary | ICD-10-CM | POA: Diagnosis not present

## 2021-10-03 DIAGNOSIS — I10 Essential (primary) hypertension: Secondary | ICD-10-CM | POA: Diagnosis not present

## 2021-10-03 DIAGNOSIS — E785 Hyperlipidemia, unspecified: Secondary | ICD-10-CM | POA: Diagnosis not present

## 2021-10-03 DIAGNOSIS — D62 Acute posthemorrhagic anemia: Secondary | ICD-10-CM | POA: Diagnosis not present

## 2021-10-03 DIAGNOSIS — Z48811 Encounter for surgical aftercare following surgery on the nervous system: Secondary | ICD-10-CM | POA: Diagnosis not present

## 2021-10-03 DIAGNOSIS — Z9181 History of falling: Secondary | ICD-10-CM | POA: Diagnosis not present

## 2021-10-03 DIAGNOSIS — Z87891 Personal history of nicotine dependence: Secondary | ICD-10-CM | POA: Diagnosis not present

## 2021-10-03 DIAGNOSIS — N179 Acute kidney failure, unspecified: Secondary | ICD-10-CM | POA: Diagnosis not present

## 2021-10-03 DIAGNOSIS — E1142 Type 2 diabetes mellitus with diabetic polyneuropathy: Secondary | ICD-10-CM | POA: Diagnosis not present

## 2021-10-03 DIAGNOSIS — K5900 Constipation, unspecified: Secondary | ICD-10-CM | POA: Diagnosis not present

## 2021-10-04 DIAGNOSIS — Z48811 Encounter for surgical aftercare following surgery on the nervous system: Secondary | ICD-10-CM | POA: Diagnosis not present

## 2021-10-04 DIAGNOSIS — I10 Essential (primary) hypertension: Secondary | ICD-10-CM | POA: Diagnosis not present

## 2021-10-04 DIAGNOSIS — D62 Acute posthemorrhagic anemia: Secondary | ICD-10-CM | POA: Diagnosis not present

## 2021-10-04 DIAGNOSIS — Z9181 History of falling: Secondary | ICD-10-CM | POA: Diagnosis not present

## 2021-10-04 DIAGNOSIS — E1165 Type 2 diabetes mellitus with hyperglycemia: Secondary | ICD-10-CM | POA: Diagnosis not present

## 2021-10-04 DIAGNOSIS — K5900 Constipation, unspecified: Secondary | ICD-10-CM | POA: Diagnosis not present

## 2021-10-04 DIAGNOSIS — N179 Acute kidney failure, unspecified: Secondary | ICD-10-CM | POA: Diagnosis not present

## 2021-10-04 DIAGNOSIS — E1142 Type 2 diabetes mellitus with diabetic polyneuropathy: Secondary | ICD-10-CM | POA: Diagnosis not present

## 2021-10-04 DIAGNOSIS — E785 Hyperlipidemia, unspecified: Secondary | ICD-10-CM | POA: Diagnosis not present

## 2021-10-04 DIAGNOSIS — Z87891 Personal history of nicotine dependence: Secondary | ICD-10-CM | POA: Diagnosis not present

## 2021-10-05 ENCOUNTER — Telehealth: Payer: Self-pay | Admitting: Family Medicine

## 2021-10-05 NOTE — Telephone Encounter (Signed)
[name] PT with [facility] is calling and needs PT orders 1x1 and then 2x4

## 2021-10-06 DIAGNOSIS — Z9181 History of falling: Secondary | ICD-10-CM | POA: Diagnosis not present

## 2021-10-06 DIAGNOSIS — E1165 Type 2 diabetes mellitus with hyperglycemia: Secondary | ICD-10-CM | POA: Diagnosis not present

## 2021-10-06 DIAGNOSIS — Z87891 Personal history of nicotine dependence: Secondary | ICD-10-CM | POA: Diagnosis not present

## 2021-10-06 DIAGNOSIS — N179 Acute kidney failure, unspecified: Secondary | ICD-10-CM | POA: Diagnosis not present

## 2021-10-06 DIAGNOSIS — K5900 Constipation, unspecified: Secondary | ICD-10-CM | POA: Diagnosis not present

## 2021-10-06 DIAGNOSIS — Z48811 Encounter for surgical aftercare following surgery on the nervous system: Secondary | ICD-10-CM | POA: Diagnosis not present

## 2021-10-06 DIAGNOSIS — I10 Essential (primary) hypertension: Secondary | ICD-10-CM | POA: Diagnosis not present

## 2021-10-06 DIAGNOSIS — E785 Hyperlipidemia, unspecified: Secondary | ICD-10-CM | POA: Diagnosis not present

## 2021-10-06 DIAGNOSIS — E1142 Type 2 diabetes mellitus with diabetic polyneuropathy: Secondary | ICD-10-CM | POA: Diagnosis not present

## 2021-10-06 DIAGNOSIS — D62 Acute posthemorrhagic anemia: Secondary | ICD-10-CM | POA: Diagnosis not present

## 2021-10-06 NOTE — Telephone Encounter (Signed)
I left the VO on [name]'s voicemail.

## 2021-10-10 DIAGNOSIS — E1165 Type 2 diabetes mellitus with hyperglycemia: Secondary | ICD-10-CM | POA: Diagnosis not present

## 2021-10-10 DIAGNOSIS — N179 Acute kidney failure, unspecified: Secondary | ICD-10-CM | POA: Diagnosis not present

## 2021-10-10 DIAGNOSIS — Z87891 Personal history of nicotine dependence: Secondary | ICD-10-CM | POA: Diagnosis not present

## 2021-10-10 DIAGNOSIS — K5900 Constipation, unspecified: Secondary | ICD-10-CM | POA: Diagnosis not present

## 2021-10-10 DIAGNOSIS — E1142 Type 2 diabetes mellitus with diabetic polyneuropathy: Secondary | ICD-10-CM | POA: Diagnosis not present

## 2021-10-10 DIAGNOSIS — Z9181 History of falling: Secondary | ICD-10-CM | POA: Diagnosis not present

## 2021-10-10 DIAGNOSIS — E785 Hyperlipidemia, unspecified: Secondary | ICD-10-CM | POA: Diagnosis not present

## 2021-10-10 DIAGNOSIS — I10 Essential (primary) hypertension: Secondary | ICD-10-CM | POA: Diagnosis not present

## 2021-10-10 DIAGNOSIS — Z48811 Encounter for surgical aftercare following surgery on the nervous system: Secondary | ICD-10-CM | POA: Diagnosis not present

## 2021-10-10 DIAGNOSIS — D62 Acute posthemorrhagic anemia: Secondary | ICD-10-CM | POA: Diagnosis not present

## 2021-10-13 ENCOUNTER — Other Ambulatory Visit: Payer: Self-pay | Admitting: Family Medicine

## 2021-10-13 DIAGNOSIS — K5900 Constipation, unspecified: Secondary | ICD-10-CM | POA: Diagnosis not present

## 2021-10-13 DIAGNOSIS — Z9181 History of falling: Secondary | ICD-10-CM | POA: Diagnosis not present

## 2021-10-13 DIAGNOSIS — Z48811 Encounter for surgical aftercare following surgery on the nervous system: Secondary | ICD-10-CM | POA: Diagnosis not present

## 2021-10-13 DIAGNOSIS — I10 Essential (primary) hypertension: Secondary | ICD-10-CM | POA: Diagnosis not present

## 2021-10-13 DIAGNOSIS — E785 Hyperlipidemia, unspecified: Secondary | ICD-10-CM | POA: Diagnosis not present

## 2021-10-13 DIAGNOSIS — E1142 Type 2 diabetes mellitus with diabetic polyneuropathy: Secondary | ICD-10-CM | POA: Diagnosis not present

## 2021-10-13 DIAGNOSIS — E1165 Type 2 diabetes mellitus with hyperglycemia: Secondary | ICD-10-CM | POA: Diagnosis not present

## 2021-10-13 DIAGNOSIS — Z87891 Personal history of nicotine dependence: Secondary | ICD-10-CM | POA: Diagnosis not present

## 2021-10-13 DIAGNOSIS — D62 Acute posthemorrhagic anemia: Secondary | ICD-10-CM | POA: Diagnosis not present

## 2021-10-13 DIAGNOSIS — N179 Acute kidney failure, unspecified: Secondary | ICD-10-CM | POA: Diagnosis not present

## 2021-10-14 DIAGNOSIS — I2699 Other pulmonary embolism without acute cor pulmonale: Secondary | ICD-10-CM | POA: Diagnosis not present

## 2021-10-14 DIAGNOSIS — S82851A Displaced trimalleolar fracture of right lower leg, initial encounter for closed fracture: Secondary | ICD-10-CM | POA: Diagnosis not present

## 2021-10-14 DIAGNOSIS — R5381 Other malaise: Secondary | ICD-10-CM | POA: Diagnosis not present

## 2021-10-16 DIAGNOSIS — M4714 Other spondylosis with myelopathy, thoracic region: Secondary | ICD-10-CM | POA: Diagnosis not present

## 2021-10-17 DIAGNOSIS — E1142 Type 2 diabetes mellitus with diabetic polyneuropathy: Secondary | ICD-10-CM | POA: Diagnosis not present

## 2021-10-17 DIAGNOSIS — E785 Hyperlipidemia, unspecified: Secondary | ICD-10-CM | POA: Diagnosis not present

## 2021-10-17 DIAGNOSIS — Z87891 Personal history of nicotine dependence: Secondary | ICD-10-CM | POA: Diagnosis not present

## 2021-10-17 DIAGNOSIS — Z48811 Encounter for surgical aftercare following surgery on the nervous system: Secondary | ICD-10-CM | POA: Diagnosis not present

## 2021-10-17 DIAGNOSIS — N179 Acute kidney failure, unspecified: Secondary | ICD-10-CM | POA: Diagnosis not present

## 2021-10-17 DIAGNOSIS — E1165 Type 2 diabetes mellitus with hyperglycemia: Secondary | ICD-10-CM | POA: Diagnosis not present

## 2021-10-17 DIAGNOSIS — I10 Essential (primary) hypertension: Secondary | ICD-10-CM | POA: Diagnosis not present

## 2021-10-17 DIAGNOSIS — Z9181 History of falling: Secondary | ICD-10-CM | POA: Diagnosis not present

## 2021-10-17 DIAGNOSIS — K5900 Constipation, unspecified: Secondary | ICD-10-CM | POA: Diagnosis not present

## 2021-10-17 DIAGNOSIS — D62 Acute posthemorrhagic anemia: Secondary | ICD-10-CM | POA: Diagnosis not present

## 2021-10-19 DIAGNOSIS — N179 Acute kidney failure, unspecified: Secondary | ICD-10-CM | POA: Diagnosis not present

## 2021-10-19 DIAGNOSIS — Z48811 Encounter for surgical aftercare following surgery on the nervous system: Secondary | ICD-10-CM | POA: Diagnosis not present

## 2021-10-19 DIAGNOSIS — I10 Essential (primary) hypertension: Secondary | ICD-10-CM | POA: Diagnosis not present

## 2021-10-19 DIAGNOSIS — Z87891 Personal history of nicotine dependence: Secondary | ICD-10-CM | POA: Diagnosis not present

## 2021-10-19 DIAGNOSIS — Z9181 History of falling: Secondary | ICD-10-CM | POA: Diagnosis not present

## 2021-10-19 DIAGNOSIS — E1142 Type 2 diabetes mellitus with diabetic polyneuropathy: Secondary | ICD-10-CM | POA: Diagnosis not present

## 2021-10-19 DIAGNOSIS — E785 Hyperlipidemia, unspecified: Secondary | ICD-10-CM | POA: Diagnosis not present

## 2021-10-19 DIAGNOSIS — E1165 Type 2 diabetes mellitus with hyperglycemia: Secondary | ICD-10-CM | POA: Diagnosis not present

## 2021-10-19 DIAGNOSIS — K5900 Constipation, unspecified: Secondary | ICD-10-CM | POA: Diagnosis not present

## 2021-10-19 DIAGNOSIS — D62 Acute posthemorrhagic anemia: Secondary | ICD-10-CM | POA: Diagnosis not present

## 2021-10-23 ENCOUNTER — Other Ambulatory Visit: Payer: Self-pay | Admitting: Physical Medicine and Rehabilitation

## 2021-10-24 DIAGNOSIS — E785 Hyperlipidemia, unspecified: Secondary | ICD-10-CM | POA: Diagnosis not present

## 2021-10-24 DIAGNOSIS — K5900 Constipation, unspecified: Secondary | ICD-10-CM | POA: Diagnosis not present

## 2021-10-24 DIAGNOSIS — N179 Acute kidney failure, unspecified: Secondary | ICD-10-CM | POA: Diagnosis not present

## 2021-10-24 DIAGNOSIS — E1165 Type 2 diabetes mellitus with hyperglycemia: Secondary | ICD-10-CM | POA: Diagnosis not present

## 2021-10-24 DIAGNOSIS — Z87891 Personal history of nicotine dependence: Secondary | ICD-10-CM | POA: Diagnosis not present

## 2021-10-24 DIAGNOSIS — Z48811 Encounter for surgical aftercare following surgery on the nervous system: Secondary | ICD-10-CM | POA: Diagnosis not present

## 2021-10-24 DIAGNOSIS — Z9181 History of falling: Secondary | ICD-10-CM | POA: Diagnosis not present

## 2021-10-24 DIAGNOSIS — E1142 Type 2 diabetes mellitus with diabetic polyneuropathy: Secondary | ICD-10-CM | POA: Diagnosis not present

## 2021-10-24 DIAGNOSIS — I10 Essential (primary) hypertension: Secondary | ICD-10-CM | POA: Diagnosis not present

## 2021-10-24 DIAGNOSIS — D62 Acute posthemorrhagic anemia: Secondary | ICD-10-CM | POA: Diagnosis not present

## 2021-10-25 ENCOUNTER — Other Ambulatory Visit: Payer: Self-pay | Admitting: Physical Medicine and Rehabilitation

## 2021-10-26 DIAGNOSIS — Z9181 History of falling: Secondary | ICD-10-CM | POA: Diagnosis not present

## 2021-10-26 DIAGNOSIS — D62 Acute posthemorrhagic anemia: Secondary | ICD-10-CM | POA: Diagnosis not present

## 2021-10-26 DIAGNOSIS — E1165 Type 2 diabetes mellitus with hyperglycemia: Secondary | ICD-10-CM | POA: Diagnosis not present

## 2021-10-26 DIAGNOSIS — E785 Hyperlipidemia, unspecified: Secondary | ICD-10-CM | POA: Diagnosis not present

## 2021-10-26 DIAGNOSIS — N179 Acute kidney failure, unspecified: Secondary | ICD-10-CM | POA: Diagnosis not present

## 2021-10-26 DIAGNOSIS — I10 Essential (primary) hypertension: Secondary | ICD-10-CM | POA: Diagnosis not present

## 2021-10-26 DIAGNOSIS — Z87891 Personal history of nicotine dependence: Secondary | ICD-10-CM | POA: Diagnosis not present

## 2021-10-26 DIAGNOSIS — Z48811 Encounter for surgical aftercare following surgery on the nervous system: Secondary | ICD-10-CM | POA: Diagnosis not present

## 2021-10-26 DIAGNOSIS — K5900 Constipation, unspecified: Secondary | ICD-10-CM | POA: Diagnosis not present

## 2021-10-26 DIAGNOSIS — E1142 Type 2 diabetes mellitus with diabetic polyneuropathy: Secondary | ICD-10-CM | POA: Diagnosis not present

## 2021-10-30 ENCOUNTER — Encounter: Payer: Self-pay | Admitting: Physical Medicine and Rehabilitation

## 2021-10-30 ENCOUNTER — Other Ambulatory Visit: Payer: Self-pay

## 2021-10-30 ENCOUNTER — Encounter
Payer: Medicare Other | Attending: Physical Medicine and Rehabilitation | Admitting: Physical Medicine and Rehabilitation

## 2021-10-30 VITALS — BP 138/85 | HR 77 | Temp 99.4°F | Ht 72.0 in | Wt 217.0 lb

## 2021-10-30 DIAGNOSIS — M7918 Myalgia, other site: Secondary | ICD-10-CM | POA: Insufficient documentation

## 2021-10-30 DIAGNOSIS — M4714 Other spondylosis with myelopathy, thoracic region: Secondary | ICD-10-CM | POA: Diagnosis not present

## 2021-10-30 DIAGNOSIS — R29898 Other symptoms and signs involving the musculoskeletal system: Secondary | ICD-10-CM | POA: Diagnosis not present

## 2021-10-30 DIAGNOSIS — G894 Chronic pain syndrome: Secondary | ICD-10-CM | POA: Diagnosis not present

## 2021-10-30 DIAGNOSIS — G9520 Unspecified cord compression: Secondary | ICD-10-CM | POA: Diagnosis not present

## 2021-10-30 MED ORDER — DULOXETINE HCL 60 MG PO CPEP: 60.0000 mg | ORAL_CAPSULE | Freq: Every day | ORAL | 5 refills | Status: DC

## 2021-10-30 MED ORDER — DIAZEPAM 2 MG PO TABS: 2.0000 mg | ORAL_TABLET | Freq: Every morning | ORAL | 3 refills | Status: DC

## 2021-10-30 NOTE — Patient Instructions (Signed)
Pt is a 71 yr old male with hx of HTN and DM and T11/12 lami/fusion by Dr Mellody Drown on 08/09/21-  Here for hospital f/u. Gets pain meds from Dr Brien Few Incomplete paraplegia/ASIA D thoracic myelopathy   Has refills for Keppra 250 mg BID for nerve pain; hasn't picked it up yet.   2. Con't Gabapentin 600 mg TID- #90- 5 refills given 10/23/21   3. Con't Trazodone- 1/2 to 1 tab nightly as needed- given 12/22- has refills   4. Con't Valium will change to 2 mg 2x/day as needed for muscle spasms. #60 with refills  5. Change Duloxetine to 60 mg capsule daily #30- 5 refills.    6. Discussed prognosis- takes 6-12 months to hit maximal effect.    7. Continue H/H- PT and OT- and work on therapy.   8. Will  put on wait list to try and get him in for trigger point injections.  And try to get in ASAP.   9. Percocet per pain doctor- Dr Brien Few.   10. F/U in 3 months for f/u on SCI.

## 2021-10-30 NOTE — Progress Notes (Signed)
Subjective:    Patient ID: Keith [name], male    DOB: Nov 15, 1950, 71 y.o.   MRN: [id number]  HPI Pt is a 71 yr old male with hx of HTN and DM and T11/12 lami/fusion by Dr Mellody Drown on 08/09/21-  Here for hospital f/u.    Things "off and on"- since surgery.  Had a severe trigger point in back last week. Having some trigger points when stays in w/c, so tries to stay OUT of w/c as much as possible.   Had to stop therapy session last week due to how bad the muscle spasm/trigger point was last week- felt like stabbed with butcher knife.    Appt with Dr Brien Few is tomorrow- still taking Percocet (chronic) 7.5 mg /325 mg- 4x/day.  Sent home on Valium and keppra 250 mg BID- Out of Keppra- didn't get it renewed  Has 3 Valium left- has been breaking them in half- prefers not to take since make so sleepy-when takes them, do help the trigger points/muscle spasms.   Still on Gabapentin 600 mg TID and Cymbalta 60 mg daily- I started Cymbalta in hospital- was on gabapentin.    Nerve pain- pretty OK  Worse in legs and feet-  Weakness and numbness is still there, better than when in hospital, but can't figure out why.   Feet tend to swell if in place for awhile and numbness in RLE- numbness in R thigh down to R foot/toes.   Still not able to lift his feet as well as he "should"- esp up steps- still walking with RW-  Showed up in w/c- doesn't think can walk from front to here yet- without getting exhausted- still using UB strength to push RW.  Hips and hamstrings are still very weak.   Walked 1 minute and 36 seconds last week- walked 170 ft with RW.  Walking around the house with the RW- going to bathroom, etc.   Went into private therapy- they are coming to the house to do therapy- PT and OT.   Got a hospital bed- since has 2 levels and cannot get upstairs.  Can pull self into standing position from hospital bed- doesn't need help, just increased time.    Bowels going well and  urinating well- BM's 1x/day.     Pain Inventory Average Pain 3 Pain Right Now 5 My pain is intermittent and stabbing  LOCATION OF PAIN  Left upper back  BOWEL Number of stools per week: 7 Oral laxative use No  Type of laxative Miralax Enema or suppository use No  History of colostomy No  Incontinent No   BLADDER Normal In and out cath, frequency n/a Able to self cath No  Bladder incontinence Yes  Frequent urination Yes  Leakage with coughing No  Difficulty starting stream No  Incomplete bladder emptying Yes    Mobility use a cane use a walker how many minutes can you walk? One minute and 36 seconds ability to climb steps?  yes do you drive?  no use a wheelchair transfers alone Do you have any goals in this area?  yes  Function retired I need assistance with the following:  bathing, toileting, meal prep, household duties, and shopping Do you have any goals in this area?  yes  Neuro/Psych bladder control problems weakness numbness trouble walking  Prior Studies Any changes since last visit?  no  Physicians involved in your care Any changes since last visit?  no   Family History  Problem Relation Age  of Onset   Diabetes Mother    Asthma Mother    Hypertension Father    Stroke Father    Colon cancer Neg Hx    Social History   Socioeconomic History   Marital status: Married    Spouse name: Not on file   Number of children: Not on file   Years of education: Not on file   Highest education level: Not on file  Occupational History   Occupation: retired  Tobacco Use   Smoking status: Former    Packs/day: 1.00    Years: 25.00    Pack years: 25.00    Types: Cigarettes    Quit date: 1995    Years since quitting: 28.0   Smokeless tobacco: Never  Vaping Use   Vaping Use: Never used  Substance and Sexual Activity   Alcohol use: Yes    Comment: holidays and special occasions   Drug use: No   Sexual activity: Yes    Birth control/protection:  None  Other Topics Concern   Not on file  Social History Narrative   Married.     Social Determinants of Health   Financial Resource Strain: Low Risk    Difficulty of Paying Living Expenses: Not hard at all  Food Insecurity: No Food Insecurity   Worried About Charity fundraiser in the Last Year: Never true   [place] in the Last Year: Never true  Transportation Needs: No Transportation Needs   Lack of Transportation (Medical): No   Lack of Transportation (Non-Medical): No  Physical Activity: Sufficiently Active   Days of Exercise per Week: 5 days   Minutes of Exercise per Session: 60 min  Stress: No Stress Concern Present   Feeling of Stress : Not at all  Social Connections: Moderately Integrated   Frequency of Communication with Friends and Family: Three times a week   Frequency of Social Gatherings with Friends and Family: Three times a week   Attends Religious Services: More than 4 times per year   Active Member of Clubs or Organizations: No   Attends Archivist Meetings: Never   Marital Status: Married   Past Surgical History:  Procedure Laterality Date   COLONOSCOPY  2008   ESOPHAGOGASTRODUODENOSCOPY N/A 08/29/2014   Procedure: ESOPHAGOGASTRODUODENOSCOPY (EGD);  Surgeon: Lafayette Dragon, MD;  Location: [facility];  Service: Endoscopy;  Laterality: N/A;   LAMINECTOMY N/A 07/28/2018   Procedure: THORACIC ELEVEN- THORACIC TWELVE POSTERIOR DECOMPRESSION LAMINECTOMY;  Surgeon: Judith Part, MD;  Location: [place];  Service: Neurosurgery;  Laterality: N/A;   LUMBAR LAMINECTOMY     NASAL POLYP SURGERY     ORIF ANKLE FRACTURE Right 07/01/2020   Procedure: OPEN REDUCTION INTERNAL FIXATION (ORIF) ANKLE FRACTURE. TRIMALLEOLAR;  Surgeon: Marchia Bond, MD;  Location: [place];  Service: Orthopedics;  Laterality: Right;   TONSILLECTOMY     Past Medical History:  Diagnosis Date   Allergy    Chronic back pain    Diabetes mellitus without complication ([place])     DNR (do not resuscitate) 11/19/2020   Duodenal ulcer hemorrhage 08/29/2014   ED (erectile dysfunction)    Esophageal stricture 08/30/2014   Glucose intolerance (impaired glucose tolerance)    Hiatal hernia 08/30/2014   Hypertension    Hypokalemia 04/11/2013   MRSA carrier 08/30/2014   Obesity    Osteoarthritis    Pneumonia    Pulmonary embolism ([place]) 06/2020   Scoliosis 2016   Spinal stenosis of lumbar region  Urinary tract infection 04/11/2013   BP 138/85    Pulse 77    Temp 99.4 F (37.4 C)    Ht 6' (1.829 m)    SpO2 96%    BMI 37.61 kg/m   Opioid Risk Score:   Fall Risk Score:  `1  Depression screen PHQ 2/9  Depression screen [facility] 2/9 09/13/2021 01/03/2021 04/02/2020 03/17/2018 08/23/2017 12/11/2016  Decreased Interest 0 0 0 0 0 0  Down, Depressed, Hopeless 0 0 0 0 0 0  PHQ - 2 Score 0 0 0 0 0 0  Altered sleeping 0 - - - - -  Tired, decreased energy 1 - - - - -  Change in appetite 1 - - - - -  Feeling bad or failure about yourself  0 - - - - -  Trouble concentrating 0 - - - - -  Moving slowly or fidgety/restless 0 - - - - -  Suicidal thoughts 0 - - - - -  PHQ-9 Score 2 - - - - -    Review of Systems  Genitourinary:  Positive for frequency.  Musculoskeletal:  Positive for back pain and gait problem.       Spasms  Neurological:  Positive for weakness and numbness.  All other systems reviewed and are negative.     Objective:   Physical Exam  Awake, alert, appropriate, in manual w/c; accompanied by wife; NAD Laced ankle brace on L ankle- foot in pronation at rest- fallen arch  MS: RLE- HF 2-/5- KE 4-/5; DF 4+/5; PF 4+/5 LLE- HF 2-/5; KE 4/5; DF 4+/5; PF 4+/5  Neuro: Only has numbness if sitting a long time' when first wakes up Intact to light touch in L1- S1 B/L on exam      Assessment & Plan:   Pt is a 71 yr old male with hx of HTN and DM and T11/12 lami/fusion by Dr Mellody Drown on 08/09/21-  Here for hospital f/u. Gets pain meds from Dr Brien Few Incomplete  paraplegia/ASIA D thoracic myelopathy   Has refills for Keppra 250 mg BID for nerve pain; hasn't picked it up yet.   2. Con't Gabapentin 600 mg TID- #90- 5 refills given 10/23/21   3. Con't Trazodone- 1/2 to 1 tab nightly as needed- given 12/22- has refills   4. Con't Valium will change to 2 mg 2x/day as needed for muscle spasms. #60 with refills  5. Change Duloxetine to 60 mg capsule daily #30- 5 refills.    6. Discussed prognosis- takes 6-12 months to hit maximal effect.    7. Continue H/H- PT and OT- and work on therapy.   8. Will  put on wait list to try and get him in for trigger point injections.  And try to get in ASAP.   9. Percocet per pain doctor- Dr Brien Few.   10. F/U in 3 months for f/u on SCI.   I spent a total of 31 minutes on visit- discussing prognosis- and nerve pain.

## 2021-10-31 DIAGNOSIS — M961 Postlaminectomy syndrome, not elsewhere classified: Secondary | ICD-10-CM | POA: Diagnosis not present

## 2021-10-31 DIAGNOSIS — M48062 Spinal stenosis, lumbar region with neurogenic claudication: Secondary | ICD-10-CM | POA: Diagnosis not present

## 2021-10-31 DIAGNOSIS — Z79899 Other long term (current) drug therapy: Secondary | ICD-10-CM | POA: Diagnosis not present

## 2021-10-31 DIAGNOSIS — M4714 Other spondylosis with myelopathy, thoracic region: Secondary | ICD-10-CM | POA: Diagnosis not present

## 2021-11-02 DIAGNOSIS — K5900 Constipation, unspecified: Secondary | ICD-10-CM | POA: Diagnosis not present

## 2021-11-02 DIAGNOSIS — N179 Acute kidney failure, unspecified: Secondary | ICD-10-CM | POA: Diagnosis not present

## 2021-11-02 DIAGNOSIS — E1142 Type 2 diabetes mellitus with diabetic polyneuropathy: Secondary | ICD-10-CM | POA: Diagnosis not present

## 2021-11-02 DIAGNOSIS — Z9181 History of falling: Secondary | ICD-10-CM | POA: Diagnosis not present

## 2021-11-02 DIAGNOSIS — Z48811 Encounter for surgical aftercare following surgery on the nervous system: Secondary | ICD-10-CM | POA: Diagnosis not present

## 2021-11-02 DIAGNOSIS — Z87891 Personal history of nicotine dependence: Secondary | ICD-10-CM | POA: Diagnosis not present

## 2021-11-02 DIAGNOSIS — E1165 Type 2 diabetes mellitus with hyperglycemia: Secondary | ICD-10-CM | POA: Diagnosis not present

## 2021-11-02 DIAGNOSIS — E785 Hyperlipidemia, unspecified: Secondary | ICD-10-CM | POA: Diagnosis not present

## 2021-11-02 DIAGNOSIS — I10 Essential (primary) hypertension: Secondary | ICD-10-CM | POA: Diagnosis not present

## 2021-11-02 DIAGNOSIS — D62 Acute posthemorrhagic anemia: Secondary | ICD-10-CM | POA: Diagnosis not present

## 2021-11-03 ENCOUNTER — Telehealth: Payer: Self-pay | Admitting: Family Medicine

## 2021-11-03 DIAGNOSIS — E785 Hyperlipidemia, unspecified: Secondary | ICD-10-CM | POA: Diagnosis not present

## 2021-11-03 DIAGNOSIS — E1165 Type 2 diabetes mellitus with hyperglycemia: Secondary | ICD-10-CM | POA: Diagnosis not present

## 2021-11-03 DIAGNOSIS — I10 Essential (primary) hypertension: Secondary | ICD-10-CM | POA: Diagnosis not present

## 2021-11-03 DIAGNOSIS — K5900 Constipation, unspecified: Secondary | ICD-10-CM | POA: Diagnosis not present

## 2021-11-03 DIAGNOSIS — N179 Acute kidney failure, unspecified: Secondary | ICD-10-CM | POA: Diagnosis not present

## 2021-11-03 DIAGNOSIS — Z87891 Personal history of nicotine dependence: Secondary | ICD-10-CM | POA: Diagnosis not present

## 2021-11-03 DIAGNOSIS — Z9181 History of falling: Secondary | ICD-10-CM | POA: Diagnosis not present

## 2021-11-03 DIAGNOSIS — Z48811 Encounter for surgical aftercare following surgery on the nervous system: Secondary | ICD-10-CM | POA: Diagnosis not present

## 2021-11-03 DIAGNOSIS — E1142 Type 2 diabetes mellitus with diabetic polyneuropathy: Secondary | ICD-10-CM | POA: Diagnosis not present

## 2021-11-03 DIAGNOSIS — D62 Acute posthemorrhagic anemia: Secondary | ICD-10-CM | POA: Diagnosis not present

## 2021-11-03 NOTE — Telephone Encounter (Signed)
Sharisse with [facility] is calling in verbal orders for patient. The verbal orders are the following:   - 2 times a week for 3 weeks then 1 time a week for one week.  Maximiano Coss could be contacted at [phone].  Please advise,

## 2021-11-06 NOTE — Telephone Encounter (Signed)
V/o given 

## 2021-11-07 DIAGNOSIS — Z87891 Personal history of nicotine dependence: Secondary | ICD-10-CM | POA: Diagnosis not present

## 2021-11-07 DIAGNOSIS — N179 Acute kidney failure, unspecified: Secondary | ICD-10-CM | POA: Diagnosis not present

## 2021-11-07 DIAGNOSIS — Z48811 Encounter for surgical aftercare following surgery on the nervous system: Secondary | ICD-10-CM | POA: Diagnosis not present

## 2021-11-07 DIAGNOSIS — K5900 Constipation, unspecified: Secondary | ICD-10-CM | POA: Diagnosis not present

## 2021-11-07 DIAGNOSIS — E785 Hyperlipidemia, unspecified: Secondary | ICD-10-CM | POA: Diagnosis not present

## 2021-11-07 DIAGNOSIS — E1165 Type 2 diabetes mellitus with hyperglycemia: Secondary | ICD-10-CM | POA: Diagnosis not present

## 2021-11-07 DIAGNOSIS — Z9181 History of falling: Secondary | ICD-10-CM | POA: Diagnosis not present

## 2021-11-07 DIAGNOSIS — E1142 Type 2 diabetes mellitus with diabetic polyneuropathy: Secondary | ICD-10-CM | POA: Diagnosis not present

## 2021-11-07 DIAGNOSIS — I10 Essential (primary) hypertension: Secondary | ICD-10-CM | POA: Diagnosis not present

## 2021-11-07 DIAGNOSIS — D62 Acute posthemorrhagic anemia: Secondary | ICD-10-CM | POA: Diagnosis not present

## 2021-11-10 DIAGNOSIS — Z9181 History of falling: Secondary | ICD-10-CM | POA: Diagnosis not present

## 2021-11-10 DIAGNOSIS — E785 Hyperlipidemia, unspecified: Secondary | ICD-10-CM | POA: Diagnosis not present

## 2021-11-10 DIAGNOSIS — N179 Acute kidney failure, unspecified: Secondary | ICD-10-CM | POA: Diagnosis not present

## 2021-11-10 DIAGNOSIS — I10 Essential (primary) hypertension: Secondary | ICD-10-CM | POA: Diagnosis not present

## 2021-11-10 DIAGNOSIS — E1142 Type 2 diabetes mellitus with diabetic polyneuropathy: Secondary | ICD-10-CM | POA: Diagnosis not present

## 2021-11-10 DIAGNOSIS — Z48811 Encounter for surgical aftercare following surgery on the nervous system: Secondary | ICD-10-CM | POA: Diagnosis not present

## 2021-11-10 DIAGNOSIS — K5900 Constipation, unspecified: Secondary | ICD-10-CM | POA: Diagnosis not present

## 2021-11-10 DIAGNOSIS — Z87891 Personal history of nicotine dependence: Secondary | ICD-10-CM | POA: Diagnosis not present

## 2021-11-10 DIAGNOSIS — E1165 Type 2 diabetes mellitus with hyperglycemia: Secondary | ICD-10-CM | POA: Diagnosis not present

## 2021-11-10 DIAGNOSIS — D62 Acute posthemorrhagic anemia: Secondary | ICD-10-CM | POA: Diagnosis not present

## 2021-11-15 DIAGNOSIS — Z87891 Personal history of nicotine dependence: Secondary | ICD-10-CM | POA: Diagnosis not present

## 2021-11-15 DIAGNOSIS — Z9181 History of falling: Secondary | ICD-10-CM | POA: Diagnosis not present

## 2021-11-15 DIAGNOSIS — N179 Acute kidney failure, unspecified: Secondary | ICD-10-CM | POA: Diagnosis not present

## 2021-11-15 DIAGNOSIS — E1165 Type 2 diabetes mellitus with hyperglycemia: Secondary | ICD-10-CM | POA: Diagnosis not present

## 2021-11-15 DIAGNOSIS — Z48811 Encounter for surgical aftercare following surgery on the nervous system: Secondary | ICD-10-CM | POA: Diagnosis not present

## 2021-11-15 DIAGNOSIS — K5900 Constipation, unspecified: Secondary | ICD-10-CM | POA: Diagnosis not present

## 2021-11-15 DIAGNOSIS — D62 Acute posthemorrhagic anemia: Secondary | ICD-10-CM | POA: Diagnosis not present

## 2021-11-15 DIAGNOSIS — I10 Essential (primary) hypertension: Secondary | ICD-10-CM | POA: Diagnosis not present

## 2021-11-15 DIAGNOSIS — E1142 Type 2 diabetes mellitus with diabetic polyneuropathy: Secondary | ICD-10-CM | POA: Diagnosis not present

## 2021-11-15 DIAGNOSIS — E785 Hyperlipidemia, unspecified: Secondary | ICD-10-CM | POA: Diagnosis not present

## 2021-11-16 DIAGNOSIS — M4714 Other spondylosis with myelopathy, thoracic region: Secondary | ICD-10-CM | POA: Diagnosis not present

## 2021-11-17 DIAGNOSIS — K5900 Constipation, unspecified: Secondary | ICD-10-CM | POA: Diagnosis not present

## 2021-11-17 DIAGNOSIS — Z9181 History of falling: Secondary | ICD-10-CM | POA: Diagnosis not present

## 2021-11-17 DIAGNOSIS — N179 Acute kidney failure, unspecified: Secondary | ICD-10-CM | POA: Diagnosis not present

## 2021-11-17 DIAGNOSIS — E1165 Type 2 diabetes mellitus with hyperglycemia: Secondary | ICD-10-CM | POA: Diagnosis not present

## 2021-11-17 DIAGNOSIS — E785 Hyperlipidemia, unspecified: Secondary | ICD-10-CM | POA: Diagnosis not present

## 2021-11-17 DIAGNOSIS — I10 Essential (primary) hypertension: Secondary | ICD-10-CM | POA: Diagnosis not present

## 2021-11-17 DIAGNOSIS — Z87891 Personal history of nicotine dependence: Secondary | ICD-10-CM | POA: Diagnosis not present

## 2021-11-17 DIAGNOSIS — D62 Acute posthemorrhagic anemia: Secondary | ICD-10-CM | POA: Diagnosis not present

## 2021-11-17 DIAGNOSIS — E1142 Type 2 diabetes mellitus with diabetic polyneuropathy: Secondary | ICD-10-CM | POA: Diagnosis not present

## 2021-11-17 DIAGNOSIS — Z48811 Encounter for surgical aftercare following surgery on the nervous system: Secondary | ICD-10-CM | POA: Diagnosis not present

## 2021-11-21 DIAGNOSIS — N179 Acute kidney failure, unspecified: Secondary | ICD-10-CM | POA: Diagnosis not present

## 2021-11-21 DIAGNOSIS — K5900 Constipation, unspecified: Secondary | ICD-10-CM | POA: Diagnosis not present

## 2021-11-21 DIAGNOSIS — E785 Hyperlipidemia, unspecified: Secondary | ICD-10-CM | POA: Diagnosis not present

## 2021-11-21 DIAGNOSIS — E1142 Type 2 diabetes mellitus with diabetic polyneuropathy: Secondary | ICD-10-CM | POA: Diagnosis not present

## 2021-11-21 DIAGNOSIS — E1165 Type 2 diabetes mellitus with hyperglycemia: Secondary | ICD-10-CM | POA: Diagnosis not present

## 2021-11-21 DIAGNOSIS — Z87891 Personal history of nicotine dependence: Secondary | ICD-10-CM | POA: Diagnosis not present

## 2021-11-21 DIAGNOSIS — I10 Essential (primary) hypertension: Secondary | ICD-10-CM | POA: Diagnosis not present

## 2021-11-21 DIAGNOSIS — Z48811 Encounter for surgical aftercare following surgery on the nervous system: Secondary | ICD-10-CM | POA: Diagnosis not present

## 2021-11-21 DIAGNOSIS — D62 Acute posthemorrhagic anemia: Secondary | ICD-10-CM | POA: Diagnosis not present

## 2021-11-21 DIAGNOSIS — Z9181 History of falling: Secondary | ICD-10-CM | POA: Diagnosis not present

## 2021-11-23 ENCOUNTER — Other Ambulatory Visit: Payer: Self-pay | Admitting: Physical Medicine and Rehabilitation

## 2021-11-23 DIAGNOSIS — D62 Acute posthemorrhagic anemia: Secondary | ICD-10-CM | POA: Diagnosis not present

## 2021-11-23 DIAGNOSIS — Z9181 History of falling: Secondary | ICD-10-CM | POA: Diagnosis not present

## 2021-11-23 DIAGNOSIS — E785 Hyperlipidemia, unspecified: Secondary | ICD-10-CM | POA: Diagnosis not present

## 2021-11-23 DIAGNOSIS — E1165 Type 2 diabetes mellitus with hyperglycemia: Secondary | ICD-10-CM | POA: Diagnosis not present

## 2021-11-23 DIAGNOSIS — N179 Acute kidney failure, unspecified: Secondary | ICD-10-CM | POA: Diagnosis not present

## 2021-11-23 DIAGNOSIS — Z48811 Encounter for surgical aftercare following surgery on the nervous system: Secondary | ICD-10-CM | POA: Diagnosis not present

## 2021-11-23 DIAGNOSIS — Z87891 Personal history of nicotine dependence: Secondary | ICD-10-CM | POA: Diagnosis not present

## 2021-11-23 DIAGNOSIS — K5900 Constipation, unspecified: Secondary | ICD-10-CM | POA: Diagnosis not present

## 2021-11-23 DIAGNOSIS — I10 Essential (primary) hypertension: Secondary | ICD-10-CM | POA: Diagnosis not present

## 2021-11-23 DIAGNOSIS — E1142 Type 2 diabetes mellitus with diabetic polyneuropathy: Secondary | ICD-10-CM | POA: Diagnosis not present

## 2021-11-27 DIAGNOSIS — E1165 Type 2 diabetes mellitus with hyperglycemia: Secondary | ICD-10-CM | POA: Diagnosis not present

## 2021-11-27 DIAGNOSIS — D62 Acute posthemorrhagic anemia: Secondary | ICD-10-CM | POA: Diagnosis not present

## 2021-11-27 DIAGNOSIS — E1142 Type 2 diabetes mellitus with diabetic polyneuropathy: Secondary | ICD-10-CM | POA: Diagnosis not present

## 2021-11-27 DIAGNOSIS — K5900 Constipation, unspecified: Secondary | ICD-10-CM | POA: Diagnosis not present

## 2021-11-27 DIAGNOSIS — Z9181 History of falling: Secondary | ICD-10-CM | POA: Diagnosis not present

## 2021-11-27 DIAGNOSIS — E785 Hyperlipidemia, unspecified: Secondary | ICD-10-CM | POA: Diagnosis not present

## 2021-11-27 DIAGNOSIS — I10 Essential (primary) hypertension: Secondary | ICD-10-CM | POA: Diagnosis not present

## 2021-11-27 DIAGNOSIS — Z87891 Personal history of nicotine dependence: Secondary | ICD-10-CM | POA: Diagnosis not present

## 2021-11-27 DIAGNOSIS — Z48811 Encounter for surgical aftercare following surgery on the nervous system: Secondary | ICD-10-CM | POA: Diagnosis not present

## 2021-11-27 DIAGNOSIS — N179 Acute kidney failure, unspecified: Secondary | ICD-10-CM | POA: Diagnosis not present

## 2021-11-28 ENCOUNTER — Telehealth: Payer: Self-pay | Admitting: Family Medicine

## 2021-11-28 NOTE — Telephone Encounter (Signed)
Cherise with [facility] requesting verbal orders for the patient. The verbal orders are 1 week 1 2 week 3 for physical therapy.  Cherise could be contacted at [phone].  Please advise.

## 2021-11-28 NOTE — Telephone Encounter (Signed)
VO given.

## 2021-11-29 DIAGNOSIS — Z48811 Encounter for surgical aftercare following surgery on the nervous system: Secondary | ICD-10-CM | POA: Diagnosis not present

## 2021-11-29 DIAGNOSIS — E1165 Type 2 diabetes mellitus with hyperglycemia: Secondary | ICD-10-CM | POA: Diagnosis not present

## 2021-11-29 DIAGNOSIS — K5900 Constipation, unspecified: Secondary | ICD-10-CM | POA: Diagnosis not present

## 2021-11-29 DIAGNOSIS — E785 Hyperlipidemia, unspecified: Secondary | ICD-10-CM | POA: Diagnosis not present

## 2021-11-29 DIAGNOSIS — Z9181 History of falling: Secondary | ICD-10-CM | POA: Diagnosis not present

## 2021-11-29 DIAGNOSIS — D62 Acute posthemorrhagic anemia: Secondary | ICD-10-CM | POA: Diagnosis not present

## 2021-11-29 DIAGNOSIS — I10 Essential (primary) hypertension: Secondary | ICD-10-CM | POA: Diagnosis not present

## 2021-11-29 DIAGNOSIS — N179 Acute kidney failure, unspecified: Secondary | ICD-10-CM | POA: Diagnosis not present

## 2021-11-29 DIAGNOSIS — Z87891 Personal history of nicotine dependence: Secondary | ICD-10-CM | POA: Diagnosis not present

## 2021-11-29 DIAGNOSIS — E1142 Type 2 diabetes mellitus with diabetic polyneuropathy: Secondary | ICD-10-CM | POA: Diagnosis not present

## 2021-12-04 DIAGNOSIS — Z87891 Personal history of nicotine dependence: Secondary | ICD-10-CM | POA: Diagnosis not present

## 2021-12-04 DIAGNOSIS — D62 Acute posthemorrhagic anemia: Secondary | ICD-10-CM | POA: Diagnosis not present

## 2021-12-04 DIAGNOSIS — E1142 Type 2 diabetes mellitus with diabetic polyneuropathy: Secondary | ICD-10-CM | POA: Diagnosis not present

## 2021-12-04 DIAGNOSIS — E785 Hyperlipidemia, unspecified: Secondary | ICD-10-CM | POA: Diagnosis not present

## 2021-12-04 DIAGNOSIS — E1165 Type 2 diabetes mellitus with hyperglycemia: Secondary | ICD-10-CM | POA: Diagnosis not present

## 2021-12-04 DIAGNOSIS — K5900 Constipation, unspecified: Secondary | ICD-10-CM | POA: Diagnosis not present

## 2021-12-04 DIAGNOSIS — I10 Essential (primary) hypertension: Secondary | ICD-10-CM | POA: Diagnosis not present

## 2021-12-04 DIAGNOSIS — Z9181 History of falling: Secondary | ICD-10-CM | POA: Diagnosis not present

## 2021-12-04 DIAGNOSIS — N179 Acute kidney failure, unspecified: Secondary | ICD-10-CM | POA: Diagnosis not present

## 2021-12-04 DIAGNOSIS — Z48811 Encounter for surgical aftercare following surgery on the nervous system: Secondary | ICD-10-CM | POA: Diagnosis not present

## 2021-12-07 DIAGNOSIS — D62 Acute posthemorrhagic anemia: Secondary | ICD-10-CM | POA: Diagnosis not present

## 2021-12-07 DIAGNOSIS — I10 Essential (primary) hypertension: Secondary | ICD-10-CM | POA: Diagnosis not present

## 2021-12-07 DIAGNOSIS — E785 Hyperlipidemia, unspecified: Secondary | ICD-10-CM | POA: Diagnosis not present

## 2021-12-07 DIAGNOSIS — E1165 Type 2 diabetes mellitus with hyperglycemia: Secondary | ICD-10-CM | POA: Diagnosis not present

## 2021-12-07 DIAGNOSIS — Z48811 Encounter for surgical aftercare following surgery on the nervous system: Secondary | ICD-10-CM | POA: Diagnosis not present

## 2021-12-07 DIAGNOSIS — Z9181 History of falling: Secondary | ICD-10-CM | POA: Diagnosis not present

## 2021-12-07 DIAGNOSIS — Z87891 Personal history of nicotine dependence: Secondary | ICD-10-CM | POA: Diagnosis not present

## 2021-12-07 DIAGNOSIS — E1142 Type 2 diabetes mellitus with diabetic polyneuropathy: Secondary | ICD-10-CM | POA: Diagnosis not present

## 2021-12-07 DIAGNOSIS — K5900 Constipation, unspecified: Secondary | ICD-10-CM | POA: Diagnosis not present

## 2021-12-07 DIAGNOSIS — N179 Acute kidney failure, unspecified: Secondary | ICD-10-CM | POA: Diagnosis not present

## 2021-12-12 DIAGNOSIS — E1142 Type 2 diabetes mellitus with diabetic polyneuropathy: Secondary | ICD-10-CM | POA: Diagnosis not present

## 2021-12-12 DIAGNOSIS — E785 Hyperlipidemia, unspecified: Secondary | ICD-10-CM | POA: Diagnosis not present

## 2021-12-12 DIAGNOSIS — N179 Acute kidney failure, unspecified: Secondary | ICD-10-CM | POA: Diagnosis not present

## 2021-12-12 DIAGNOSIS — Z9181 History of falling: Secondary | ICD-10-CM | POA: Diagnosis not present

## 2021-12-12 DIAGNOSIS — Z48811 Encounter for surgical aftercare following surgery on the nervous system: Secondary | ICD-10-CM | POA: Diagnosis not present

## 2021-12-12 DIAGNOSIS — E1165 Type 2 diabetes mellitus with hyperglycemia: Secondary | ICD-10-CM | POA: Diagnosis not present

## 2021-12-12 DIAGNOSIS — D62 Acute posthemorrhagic anemia: Secondary | ICD-10-CM | POA: Diagnosis not present

## 2021-12-12 DIAGNOSIS — Z87891 Personal history of nicotine dependence: Secondary | ICD-10-CM | POA: Diagnosis not present

## 2021-12-12 DIAGNOSIS — I10 Essential (primary) hypertension: Secondary | ICD-10-CM | POA: Diagnosis not present

## 2021-12-12 DIAGNOSIS — K5900 Constipation, unspecified: Secondary | ICD-10-CM | POA: Diagnosis not present

## 2021-12-14 DIAGNOSIS — M4714 Other spondylosis with myelopathy, thoracic region: Secondary | ICD-10-CM | POA: Diagnosis not present

## 2021-12-15 DIAGNOSIS — E785 Hyperlipidemia, unspecified: Secondary | ICD-10-CM | POA: Diagnosis not present

## 2021-12-15 DIAGNOSIS — K5900 Constipation, unspecified: Secondary | ICD-10-CM | POA: Diagnosis not present

## 2021-12-15 DIAGNOSIS — D62 Acute posthemorrhagic anemia: Secondary | ICD-10-CM | POA: Diagnosis not present

## 2021-12-15 DIAGNOSIS — E1165 Type 2 diabetes mellitus with hyperglycemia: Secondary | ICD-10-CM | POA: Diagnosis not present

## 2021-12-15 DIAGNOSIS — Z48811 Encounter for surgical aftercare following surgery on the nervous system: Secondary | ICD-10-CM | POA: Diagnosis not present

## 2021-12-15 DIAGNOSIS — Z9181 History of falling: Secondary | ICD-10-CM | POA: Diagnosis not present

## 2021-12-15 DIAGNOSIS — Z87891 Personal history of nicotine dependence: Secondary | ICD-10-CM | POA: Diagnosis not present

## 2021-12-15 DIAGNOSIS — N179 Acute kidney failure, unspecified: Secondary | ICD-10-CM | POA: Diagnosis not present

## 2021-12-15 DIAGNOSIS — E1142 Type 2 diabetes mellitus with diabetic polyneuropathy: Secondary | ICD-10-CM | POA: Diagnosis not present

## 2021-12-15 DIAGNOSIS — I10 Essential (primary) hypertension: Secondary | ICD-10-CM | POA: Diagnosis not present

## 2021-12-18 DIAGNOSIS — E1142 Type 2 diabetes mellitus with diabetic polyneuropathy: Secondary | ICD-10-CM | POA: Diagnosis not present

## 2021-12-18 DIAGNOSIS — Z87891 Personal history of nicotine dependence: Secondary | ICD-10-CM | POA: Diagnosis not present

## 2021-12-18 DIAGNOSIS — Z48811 Encounter for surgical aftercare following surgery on the nervous system: Secondary | ICD-10-CM | POA: Diagnosis not present

## 2021-12-18 DIAGNOSIS — N179 Acute kidney failure, unspecified: Secondary | ICD-10-CM | POA: Diagnosis not present

## 2021-12-18 DIAGNOSIS — E1165 Type 2 diabetes mellitus with hyperglycemia: Secondary | ICD-10-CM | POA: Diagnosis not present

## 2021-12-18 DIAGNOSIS — K5900 Constipation, unspecified: Secondary | ICD-10-CM | POA: Diagnosis not present

## 2021-12-18 DIAGNOSIS — Z9181 History of falling: Secondary | ICD-10-CM | POA: Diagnosis not present

## 2021-12-18 DIAGNOSIS — I10 Essential (primary) hypertension: Secondary | ICD-10-CM | POA: Diagnosis not present

## 2021-12-18 DIAGNOSIS — D62 Acute posthemorrhagic anemia: Secondary | ICD-10-CM | POA: Diagnosis not present

## 2021-12-18 DIAGNOSIS — E785 Hyperlipidemia, unspecified: Secondary | ICD-10-CM | POA: Diagnosis not present

## 2021-12-19 ENCOUNTER — Telehealth: Payer: Self-pay | Admitting: Family Medicine

## 2021-12-19 NOTE — Chronic Care Management (AMB) (Signed)
?  Chronic Care Management  ? ?Outreach Note ? ?12/19/2021 ?Name: JACKEY HOUSEY MRN: [id number] DOB: Jul 23, 1951 ? ?Referred by: [name], [name], MD ?Reason for referral : No chief complaint on file. ? ? ?An unsuccessful telephone outreach was attempted today. The patient was referred to the pharmacist for assistance with care management and care coordination.  ? ?Follow Up Plan:  ? ?Tatjana Dellinger ?Upstream Scheduler  ?

## 2021-12-19 NOTE — Progress Notes (Signed)
?  Chronic Care Management  ? ?Note ? ?12/19/2021 ?Name: Keith [name] MRN: [id number] DOB: 13-Oct-1950 ? ?[name] is a 71 y.o. year old male who is a primary care patient of [name], Malka So, MD. I reached out to Keith [name] by phone today in response to a referral sent by Keith [name]'s PCP, [name], [name], MD.  ? ?[name] was given information about Chronic Care Management services today including:  ?CCM service includes personalized support from designated clinical staff supervised by his physician, including individualized plan of care and coordination with other care providers ?24/7 contact phone numbers for assistance for urgent and routine care needs. ?Service will only be billed when office clinical staff spend 20 minutes or more in a month to coordinate care. ?Only one practitioner may furnish and bill the service in a calendar month. ?The patient may stop CCM services at any time (effective at the end of the month) by phone call to the office staff. ? ? ?Patient agreed to services and verbal consent obtained.  ? ?Follow up plan: ? ? ?Tatjana Dellinger ?Upstream Scheduler  ?

## 2021-12-21 ENCOUNTER — Telehealth: Payer: Self-pay | Admitting: Pharmacist

## 2021-12-21 DIAGNOSIS — E1142 Type 2 diabetes mellitus with diabetic polyneuropathy: Secondary | ICD-10-CM | POA: Diagnosis not present

## 2021-12-21 DIAGNOSIS — E785 Hyperlipidemia, unspecified: Secondary | ICD-10-CM | POA: Diagnosis not present

## 2021-12-21 DIAGNOSIS — N179 Acute kidney failure, unspecified: Secondary | ICD-10-CM | POA: Diagnosis not present

## 2021-12-21 DIAGNOSIS — K5900 Constipation, unspecified: Secondary | ICD-10-CM | POA: Diagnosis not present

## 2021-12-21 DIAGNOSIS — I10 Essential (primary) hypertension: Secondary | ICD-10-CM | POA: Diagnosis not present

## 2021-12-21 DIAGNOSIS — Z48811 Encounter for surgical aftercare following surgery on the nervous system: Secondary | ICD-10-CM | POA: Diagnosis not present

## 2021-12-21 DIAGNOSIS — E1165 Type 2 diabetes mellitus with hyperglycemia: Secondary | ICD-10-CM | POA: Diagnosis not present

## 2021-12-21 DIAGNOSIS — Z87891 Personal history of nicotine dependence: Secondary | ICD-10-CM | POA: Diagnosis not present

## 2021-12-21 DIAGNOSIS — D62 Acute posthemorrhagic anemia: Secondary | ICD-10-CM | POA: Diagnosis not present

## 2021-12-21 DIAGNOSIS — Z9181 History of falling: Secondary | ICD-10-CM | POA: Diagnosis not present

## 2021-12-21 NOTE — Chronic Care Management (AMB) (Signed)
? ? ?Chronic Care Management ?Pharmacy Assistant  ? ?Name: Keith [name]  MRN: [id number] DOB: 03-May-1951 ? ?Reason for Encounter: Chart review for Initial Encounter with Jeni Salles Clinical Pharmacist on 12/2221 at 10:30 via phone call ?  ?Conditions to be addressed/monitored: ?HTN, HLD, DMII, Allergic Rhinitis, and Osteoarthritis ? ?Recent office visits:  ?09/13/21 [name], [name], MD - Patient presented for Constipation unspecified and other concerns. No medication changes. ? ?Recent consult visits:  ?10/30/21 Courtney Heys, MD (Phys Med) - Patient presented for Spinal Cord Compression and other concerns. Changed Diazepam & Duloxetine. ? ?09/19/21 Patient presented to [facility] for DG Xray  ? ?08/09/21 Judith Part, MD - Patient presented for Thoracic 11-12 decompression and posterior instrumented fusion. ? ?06/27/21 Brayton El (Phys Med) - Patient presented for  Spondylosis without myelopathy or radiculopathy, lumbar region and other concerns. No other visit details available. ? ? ?Hospital visits:  ?Medication Reconciliation was completed by comparing discharge summary, patient?s EMR and Pharmacy list, and upon discussion with patient. ? ?Patient presented to [facility] on 08/21/21 due to Thoracic myelopathy. Patient was present for 16 days. ? ?New?Medications Started at [facility] Discharge:?? ?-started  ?acetaminophen (TYLENOL) ?diazepam (VALIUM) ?This medication has multiple start dates; refer to the medication list below for more details. ?levETIRAcetam (KEPPRA) ?lidocaine (LIDODERM) ?naloxone (Narcan) ?traZODone (DESYREL ? ?Medication Changes at Hospital Discharge: ?-Changed  ?oxyCODONE-acetaminophen (PERCOCET) ? ?Medications Discontinued at Hospital Discharge: ?-Stopped  ?aluminum-magnesium hydroxide-simethicone 706-237-62 MG/5ML Susp (MAALOX) ?metFORMIN 500 MG tablet (GLUCOPHAGE ? ?Medications that remain the same after Hospital Discharge:??  ?-All other  medications will remain the same.   ? ?Patient presented to [facility] on 08/07/21 due to Thoracic myelopathy and other concerns. Patient was present for 14 days. ? ?New?Medications Started at [facility] Discharge:?? ?-started  ?Not avail ? ?Medication Changes at Hospital Discharge: ?-Changed  ?Not avail ? ?Medications Discontinued at Hospital Discharge: ?-Stopped  ?Not avail ? ?Medications that remain the same after Hospital Discharge:??  ?-All other medications will remain the same.   ? ? ?Medications: ?Outpatient Encounter Medications as of 12/21/2021  ?Medication Sig Note  ? acetaminophen (TYLENOL) 325 MG tablet Take 1-2 tablets (325-650 mg total) by mouth every 4 (four) hours as needed for mild pain.   ? atenolol-chlorthalidone (TENORETIC) 50-25 MG tablet TAKE 1 TABLET BY MOUTH EVERY DAY   ? cloNIDine (CATAPRES) 0.1 MG tablet TAKE 1 TABLET (0.1 MG TOTAL) BY MOUTH DAILY.   ? diazepam (VALIUM) 2 MG tablet Take 1 tablet (2 mg total) by mouth in the morning. For muscle spasms as needed   ? DULoxetine (CYMBALTA) 60 MG capsule TAKE 1 CAPSULE BY MOUTH EVERY DAY   ? gabapentin (NEURONTIN) 600 MG tablet TAKE 1 TABLET BY MOUTH THREE TIMES A DAY   ? levETIRAcetam (KEPPRA) 250 MG tablet TAKE 1 TABLET BY MOUTH TWICE A DAY   ? lidocaine (LIDODERM) 5 % Place 2 patches onto the skin daily. Apply at 8 pm and remove at 8 am daily.   ? Multiple Vitamin (MULITIVITAMIN WITH MINERALS) TABS Take 1 tablet by mouth daily with breakfast.   ? naloxone (NARCAN) nasal spray 4 mg/0.1 mL 1 spray once.   ? oxyCODONE-acetaminophen (PERCOCET) 7.5-325 MG tablet Take 2 tablets by mouth every 6 (six) hours as needed for severe pain. Note that 120 pills were filled on 10/31--day prior to hospital admission. Taking this four times a day should be equal to using 10 mg three times a day as  in the hospital. Can wean down as able. 10/30/2021: Not here for count. Last taken today.  ? polyethylene glycol (MIRALAX / GLYCOLAX) 17 g packet Take  17 g by mouth daily as needed for mild constipation.   ? potassium chloride SA (KLOR-CON) 20 MEQ tablet TAKE 1 TABLET (20 MEQ TOTAL) BY MOUTH DAILY. (Patient taking differently: Take 20 mEq by mouth daily.)   ? simvastatin (ZOCOR) 40 MG tablet TAKE 1 TABLET BY MOUTH EVERY DAY   ? traZODone (DESYREL) 50 MG tablet TAKE 0.5-1 TABLETS BY MOUTH AT BEDTIME AS NEEDED FOR SLEEP.   ? [DISCONTINUED] apixaban (ELIQUIS) 5 MG TABS tablet Take 1 tablet (5 mg total) by mouth 2 (two) times daily. (Patient not taking: No sig reported)   ? ?No facility-administered encounter medications on file as of 12/21/2021.  ?Fill History : ?ATENOLOL/CHLORTHALIDONE  50-25 MG TABS 12/14/2021 90  ? ?CLONIDINE HYDROCHLORIDE  0.1 MG TABS 12/13/2021 90  ? ?DIAZEPAM 2 MG TABLET 10/30/2021 30  ? ?DULOXETINE HCL DR 60 MG CAP 11/24/2021 90  ? ?GABAPENTIN 600 MG TABLET 11/22/2021 30  ? ?LEVETIRACETAM 250 MG TABLET 11/24/2021 90  ? ?LIDOCAINE 5% PATCH 09/06/2021 30  ? ?NALOXONE HCL 4 MG NASAL SPRAY 09/06/2021 2  ? ?OXYCODONE-ACETAMINOPHN 7.5-325 11/22/2021 30  ? ?POTASSIUM CHLORIDE 20mqER 07/19/2020 30  ? ?SIMVASTATIN  40 MG TABS 12/13/2021 90  ? ?TRAZODONE 50 MG TABLET 10/03/2021 90  ? ?Have you seen any other providers since your last visit? Patient reports none ? ?Any changes in your medications or health? Patient reports he has decreased to oxycodone over they years and finds that very effective for him. ? ?Any side effects from any medications? Patient reports none. ? ?Do you have an symptoms or problems not managed by your medications? Patient reports he is taking Gabapentin for the nerves in his right leg, he reports weather and pressure changes bother him even with the Gabapentin he reports there is also a plate in that ankle, not sure if its working effectively. ? ?Any concerns about your health right now? 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 ? ?Has your provider asked that you check blood pressure, blood sugar, or follow special diet at home? Patient reports he is checking pressures and sugars daily with reminders from wife. ? ?Do you get any type of exercise on a regular basis? Patient reports he has had many back surgeries is now doing therapy and trying to get off his walker. ? ?Can you think of a goal you would like to reach for your health? Patient reports none at this time. ? ?Do you have any problems getting your medications? Patient reports he has excellent ins plan and most of his meds are no cost, he is also happy with his pharmacy. ? ?Is there anything that you would like to discuss during the appointment? Patient reports none. ? ?Patient aware to have available blood pressure and sugar readings and  medications for call. ?Patient confirmed number [phone] call ? ?Care Gaps: ?Eye Exam - Overdue ?Zoster  Vaccine -Overdue ?COVID Booster - Overdue ?Foot Exam - Overdue ?Flu Vaccine - Overdue ?BP- 135/85 ( 10/30/21) ?AVW- 3/22 ?Lab Results  ?Component Value Date  ? HGBA1C 6.0 09/13/2021  ? ? ?Star Rating Drugs: ?Simvastatin 40 mg - Last filled 12/13/21 90 DS at [facility] ? ? ? ?LNed ClinesCMA ?Clinical Pharmacist Assistant ?[phone]? ?

## 2021-12-25 DIAGNOSIS — E785 Hyperlipidemia, unspecified: Secondary | ICD-10-CM | POA: Diagnosis not present

## 2021-12-25 DIAGNOSIS — I10 Essential (primary) hypertension: Secondary | ICD-10-CM | POA: Diagnosis not present

## 2021-12-25 DIAGNOSIS — D62 Acute posthemorrhagic anemia: Secondary | ICD-10-CM | POA: Diagnosis not present

## 2021-12-25 DIAGNOSIS — N179 Acute kidney failure, unspecified: Secondary | ICD-10-CM | POA: Diagnosis not present

## 2021-12-25 DIAGNOSIS — Z87891 Personal history of nicotine dependence: Secondary | ICD-10-CM | POA: Diagnosis not present

## 2021-12-25 DIAGNOSIS — Z48811 Encounter for surgical aftercare following surgery on the nervous system: Secondary | ICD-10-CM | POA: Diagnosis not present

## 2021-12-25 DIAGNOSIS — Z9181 History of falling: Secondary | ICD-10-CM | POA: Diagnosis not present

## 2021-12-25 DIAGNOSIS — E1165 Type 2 diabetes mellitus with hyperglycemia: Secondary | ICD-10-CM | POA: Diagnosis not present

## 2021-12-25 DIAGNOSIS — E1142 Type 2 diabetes mellitus with diabetic polyneuropathy: Secondary | ICD-10-CM | POA: Diagnosis not present

## 2021-12-25 DIAGNOSIS — K5900 Constipation, unspecified: Secondary | ICD-10-CM | POA: Diagnosis not present

## 2021-12-26 DIAGNOSIS — M4714 Other spondylosis with myelopathy, thoracic region: Secondary | ICD-10-CM | POA: Diagnosis not present

## 2021-12-26 DIAGNOSIS — Z79899 Other long term (current) drug therapy: Secondary | ICD-10-CM | POA: Diagnosis not present

## 2021-12-26 DIAGNOSIS — M961 Postlaminectomy syndrome, not elsewhere classified: Secondary | ICD-10-CM | POA: Diagnosis not present

## 2021-12-26 DIAGNOSIS — M47816 Spondylosis without myelopathy or radiculopathy, lumbar region: Secondary | ICD-10-CM | POA: Diagnosis not present

## 2021-12-26 DIAGNOSIS — M48062 Spinal stenosis, lumbar region with neurogenic claudication: Secondary | ICD-10-CM | POA: Diagnosis not present

## 2021-12-27 ENCOUNTER — Ambulatory Visit (INDEPENDENT_AMBULATORY_CARE_PROVIDER_SITE_OTHER): Payer: Medicare Other | Admitting: Pharmacist

## 2021-12-27 DIAGNOSIS — E1140 Type 2 diabetes mellitus with diabetic neuropathy, unspecified: Secondary | ICD-10-CM

## 2021-12-27 DIAGNOSIS — I10 Essential (primary) hypertension: Secondary | ICD-10-CM

## 2021-12-27 NOTE — Patient Instructions (Signed)
Hi Keith [name], ? ?It was great to get to meet you over the telephone! Below is a summary of some of the topics we discussed.  ? ?Here is the [facility] transportation services telephone number again: [phone]. ? ?Also, don't forget to get your shingles shot and COVID booster at the pharmacy when you can! ? ?Please reach out to me if you have any questions or need anything before our follow up! ? ?Best, ?Maddie ? ?Jeni Salles, PharmD, BCACP ?Clinical Pharmacist ?Therapist, music at [facility] ?[phone] ? ? Visit Information ? ? Goals Addressed   ?None ?  ? ?Patient Care Plan: [place]  ?  ? ?Problem Identified: Problem: Hypertension, Hyperlipidemia, Diabetes, and Pain   ?  ? ?Long-Range Goal: Patient-Specific Goal   ?Start Date: 12/27/2021  ?Expected End Date: 12/28/2022  ?This Visit's Progress: On track  ?Priority: High  ?Note:   ?Current Barriers:  ?Unable to independently monitor therapeutic efficacy ?Unable to maintain control of cholesterol ? ?Pharmacist Clinical Goal(s):  ?Patient will achieve adherence to monitoring guidelines and medication adherence to achieve therapeutic efficacy ?achieve control of cholesterol as evidenced by next lipid panel  through collaboration with PharmD and provider.  ? ?Interventions: ?1:1 collaboration with [name], [name], MD regarding development and update of comprehensive plan of care as evidenced by provider attestation and co-signature ?Inter-disciplinary care team collaboration (see longitudinal plan of care) ?Comprehensive medication review performed; medication list updated in electronic medical record ? ?Hypertension (BP goal <130/80) ?-Not ideally controlled ?-Current treatment: ?Atenolol-chlorthalidone 50-25 mg 1 tablet daily - Appropriate, Effective, Safe, Accessible ?Clonidine 0.1 mg 1 tablet daily - Appropriate, Effective, Safe, Accessible ?-Medications previously tried: lisinopril (body ache)  ?-Current home readings: 120-145/80 (arm cuff; checking  every day) ?-Current dietary habits: sometimes eats more pork; sometimes goes salt free and uses [place] salt; reading package labels ?-Current exercise habits: doing PT exercises ?-Denies hypotensive/hypertensive symptoms ?-Educated on BP goals and benefits of medications for prevention of heart attack, stroke and kidney damage; ?Daily salt intake goal < 2300 mg; ?Importance of home blood pressure monitoring; ?Proper BP monitoring technique; ?Symptoms of hypotension and importance of maintaining adequate hydration; ?-Counseled to monitor BP at home at least weekly, document, and provide log at future appointments ?-Counseled on diet and exercise extensively ?Recommended to continue current medication ? ?Hyperlipidemia: (LDL goal < 70) ?-Uncontrolled ?-Current treatment: ?Simvastatin 40 mg 1 tablet daily - Appropriate, Query effective, Safe, Accessible ?-Medications previously tried: none  ?-Current dietary patterns: eating mostly at home; conscious and salt and sugar intake ?-Current exercise habits: PT exercise ?-Educated on Cholesterol goals;  ?Benefits of statin for ASCVD risk reduction; ?Importance of limiting foods high in cholesterol; ?-Counseled on diet and exercise extensively ?Recommended repeat lipid panel and consider switching to higher intensity statin.  ? ?Diabetes (A1c goal <7%) ?-Controlled ?-Current medications: ?No medications ?-Medications previously tried: metformin (not needed) ?-Current home glucose readings ?fasting glucose: checks every one in a while  ?post prandial glucose: checks every one in a while ?-Denies hypoglycemic/hyperglycemic symptoms ?-Current meal patterns: 3 meals a day (trying to cut down on portions) ?breakfast:  did not discuss ?lunch: did not discuss  ?dinner: did not discuss ?snacks: does snack some - popcorn and graham crackers ?drinks: big water fan, Dr. Malachi Bonds zero every once in a while, no tea or coffee or juice ?-Current exercise: PT exercises ?-Educated on A1c  and blood sugar goals; ?Exercise goal of 150 minutes per week; ?Carbohydrate counting and/or plate method ?-Counseled to check feet  daily and get yearly eye exams ?-Counseled on diet and exercise extensively ? ?Neuropathy/pain (Goal: minimize pain) ?-Not ideally controlled ?-Current treatment  ?Gabapentin 600 mg 1 tablet three times daily - Appropriate, Query effective, Safe, Accessible ?Oxycodone-APAP 7.5-325 mg 2 tablets every 6 hours as needed - Appropriate, Effective, Query Safe, Accessible ?Acetaminophen 325 mg 1-2 tablets as needed - Appropriate, Effective, Safe, Accessible ?Levetiracetam 250 mg 1 tablet twice daily - Appropriate, Effective, Safe, Accessible ?Duloxetine 60 mg 1 capsule daily (also for mood swings) - Appropriate, Effective, Safe, Accessible ?-Medications previously tried: diazepam (sleepiness) ?-Counseled on maximum of 3,000 mg per day of acetaminophen (Tylenol). ?Recommended alternating Tylenol with oxycodone-APAP. ? ?Health Maintenance ?-Vaccine gaps: influenza (patient declines due to sickness), shingrix, COVID booster ?-Current therapy:  ?Potassium chloride 100 mg 1 tablet daily ?Multivitamin 1 tablet daily ?Narcan as needed ?Miralax daily as needed ?-Educated on Cost vs benefit of each product must be carefully weighed by individual consumer ?-Patient is satisfied with current therapy and denies issues ?-Recommended to continue current medication ? ?Patient Goals/Self-Care Activities ?Patient will:  ?- take medications as prescribed as evidenced by patient report and record review ?check glucose as needed, document, and provide at future appointments ?check blood pressure a few times a week, document, and provide at future appointments ? ?Follow Up Plan: Telephone follow up appointment with care management team member scheduled for: 6 months ? ?  ? ? ?Keith [name] was given information about Chronic Care Management services today including:  ?CCM service includes personalized support from  designated clinical staff supervised by his physician, including individualized plan of care and coordination with other care providers ?24/7 contact phone numbers for assistance for urgent and routine care needs. ?Standard insurance, coinsurance, copays and deductibles apply for chronic care management only during months in which we provide at least 20 minutes of these services. Most insurances cover these services at 100%, however patients may be responsible for any copay, coinsurance and/or deductible if applicable. This service may help you avoid the need for more expensive face-to-face services. ?Only one practitioner may furnish and bill the service in a calendar month. ?The patient may stop CCM services at any time (effective at the end of the month) by phone call to the office staff. ? ?Patient agreed to services and verbal consent obtained.  ? ?The patient verbalized understanding of instructions, educational materials, and care plan provided today and agreed to receive a mailed copy of patient instructions, educational materials, and care plan.  ?Telephone follow up appointment with pharmacy team member scheduled for: 6 months ? ?Viona Gilmore, RPH  ?

## 2021-12-27 NOTE — Progress Notes (Signed)
? ?Chronic Care Management ?Pharmacy Note ? ?12/27/2021 ?Name:  Keith [name] MRN:  [id number] DOB:  1951-10-07 ? ?Summary: ?LDL not at goal < 70 ?BP most at goal < 130/80 per home and office readings ?Pt has stopped some of his medications that he doesn't feel are needed (trazodone, diazepam) ? ?Recommendations/Changes made from today's visit: ?-Recommend repeat lipid panel and consider switching to high intensity statin if LDL is not < 70 ?-Consider switching clonidine to amlodipine due to lessen risk of side effects/falls ?-Provided [facility] transportation phone number [phone]) ? ?Plan: ?BP assessment in 3 months ?Follow up in 6 months ? ? ?Subjective: ?Keith [name] is an 71 y.o. year old male who is a primary patient of [name], Malka So, MD.  The CCM team was consulted for assistance with disease management and care coordination needs.   ? ?Engaged with patient by telephone for initial visit in response to provider referral for pharmacy case management and/or care coordination services.  ? ?Consent to Services:  ?The patient was given the following information about Chronic Care Management services today, agreed to services, and gave verbal consent: 1. CCM service includes personalized support from designated clinical staff supervised by the primary care provider, including individualized plan of care and coordination with other care providers 2. 24/7 contact phone numbers for assistance for urgent and routine care needs. 3. Service will only be billed when office clinical staff spend 20 minutes or more in a month to coordinate care. 4. Only one practitioner may furnish and bill the service in a calendar month. 5.The patient may stop CCM services at any time (effective at the end of the month) by phone call to the office staff. 6. The patient will be responsible for cost sharing (co-pay) of up to 20% of the service fee (after annual deductible is met). Patient agreed to services and consent  obtained. ? ?Patient Care Team: ?[name], [name], MD as PCP - General (Family Medicine) ?Viona Gilmore, [facility] as Pharmacist (Pharmacist) ? ?Recent office visits: ?09/13/21 [name], [name], MD - Patient presented for Constipation unspecified and other concerns. No medication changes. ? ?Recent consult visits: ?10/30/21 Courtney Heys, MD (Phys Med) - Patient presented for Spinal Cord Compression and other concerns. Changed Diazepam & Duloxetine. ?  ?09/19/21 Patient presented to [facility] for DG Xray  ?  ?08/09/21 Judith Part, MD - Patient presented for Thoracic 11-12 decompression and posterior instrumented fusion. ?  ?06/27/21 Brayton El (Phys Med) - Patient presented for  Spondylosis without myelopathy or radiculopathy, lumbar region and other concerns. No other visit details available. ? ?Hospital visits: ?Medication Reconciliation was completed by comparing discharge summary, patient?s EMR and Pharmacy list, and upon discussion with patient. ?  ?Patient presented to [facility] on 08/21/21 due to Thoracic myelopathy. Patient was present for 16 days. ?  ?New?Medications Started at [facility] Discharge:?? ?-started  ?acetaminophen (TYLENOL) ?diazepam (VALIUM) ?This medication has multiple start dates; refer to the medication list below for more details. ?levETIRAcetam (KEPPRA) ?lidocaine (LIDODERM) ?naloxone (Narcan) ?traZODone (DESYREL ?  ?Medication Changes at Hospital Discharge: ?-Changed  ?oxyCODONE-acetaminophen (PERCOCET) ?  ?Medications Discontinued at Hospital Discharge: ?-Stopped  ?aluminum-magnesium hydroxide-simethicone 676-720-94 MG/5ML Susp (MAALOX) ?metFORMIN 500 MG tablet (GLUCOPHAGE ?  ?Medications that remain the same after Hospital Discharge:??  ?-All other medications will remain the same.   ?  ?Patient presented to [facility] on 08/07/21 due to Thoracic myelopathy and other concerns. Patient was present for 14 days. ?  ?  New?Medications  Started at [facility] Discharge:?? ?-started  ?Not avail ?  ?Medication Changes at Hospital Discharge: ?-Changed  ?Not avail ?  ?Medications Discontinued at Hospital Discharge: ?-Stopped  ?Not avail ?  ?Medications that remain the same after Hospital Discharge:??  ?-All other medications will remain the same.  ? ? ?Objective: ? ?Lab Results  ?Component Value Date  ? CREATININE 1.91 (H) 09/13/2021  ? BUN 43 (H) 09/13/2021  ? GFR 35.41 (L) 09/13/2021  ? GFRNONAA 59 (L) 09/04/2021  ? GFRAA >60 07/11/2020  ? NA 139 09/13/2021  ? K 3.6 09/13/2021  ? CALCIUM 10.3 09/13/2021  ? CO2 26 09/13/2021  ? GLUCOSE 112 (H) 09/13/2021  ? ? ?Lab Results  ?Component Value Date/Time  ? HGBA1C 6.0 09/13/2021 10:14 AM  ? HGBA1C 6.2 (H) 11/21/2020 02:00 AM  ? FRUCTOSAMINE 275 11/19/2018 02:42 PM  ? GFR 35.41 (L) 09/13/2021 10:14 AM  ? GFR 60.39 11/29/2020 11:58 AM  ? MICROALBUR 8.1 (H) 03/30/2020 04:50 PM  ? MICROALBUR 7.4 (H) 11/19/2018 02:42 PM  ?  ?Last diabetic Eye exam: No results found for: HMDIABEYEEXA  ?Last diabetic Foot exam: No results found for: HMDIABFOOTEX  ? ?Lab Results  ?Component Value Date  ? CHOL 145 08/01/2020  ? HDL 44 08/01/2020  ? [place] 82 08/01/2020  ? TRIG 92 08/01/2020  ? CHOLHDL 3.3 08/01/2020  ? ? ? ?  Latest Ref Rng & Units 08/22/2021  ?  4:36 AM 07/05/2020  ?  4:56 AM 07/03/2020  ?  6:40 AM  ?Hepatic Function  ?Total Protein 6.5 - 8.1 g/dL 7.0   7.4   6.9    ?Albumin 3.5 - 5.0 g/dL 3.1   3.3   3.2    ?AST 15 - 41 U/L 19   33   59    ?ALT 0 - 44 U/L 15   32   42    ?Alk Phosphatase 38 - 126 U/L 78   82   78    ?Total Bilirubin 0.3 - 1.2 mg/dL 0.6   0.6   0.6    ? ? ?Lab Results  ?Component Value Date/Time  ? TSH 0.80 11/11/2017 09:20 AM  ? TSH 0.58 11/05/2016 08:37 AM  ? ? ? ?  Latest Ref Rng & Units 09/13/2021  ? 10:14 AM 09/04/2021  ?  6:57 AM 08/28/2021  ?  5:57 AM  ?CBC  ?WBC 4.0 - 10.5 K/uL 15.6   4.4   5.7    ?Hemoglobin 13.0 - 17.0 g/dL 10.5   9.8   10.2    ?Hematocrit 39.0 - 52.0 % 32.6   30.0   32.2     ?Platelets 150.0 - 400.0 K/uL 334.0   303   313    ? ? ?No results found for: VD25OH ? ?Clinical ASCVD: No  ?The 10-year ASCVD risk score (Arnett DK, et al., 2019) is: 36.4% ?  Values used to calculate the score: ?    Age: 86 years ?    Sex: Male ?    Is Non-Hispanic African American: Yes ?    Diabetic: Yes ?    Tobacco smoker: No ?    Systolic Blood Pressure: 326 mmHg ?    Is BP treated: Yes ?    HDL Cholesterol: 44 mg/dL ?    Total Cholesterol: 145 mg/dL   ? ? ?  10/30/2021  ? 12:55 PM 09/13/2021  ?  9:35 AM 01/03/2021  ? 11:23 AM  ?Depression screen PHQ 2/9  ?  Decreased Interest 0 0 0  ?Down, Depressed, Hopeless 0 0 0  ?PHQ - 2 Score 0 0 0  ?Altered sleeping 0 0   ?Tired, decreased energy 0 1   ?Change in appetite 0 1   ?Feeling bad or failure about yourself  0 0   ?Trouble concentrating 0 0   ?Moving slowly or fidgety/restless 0 0   ?Suicidal thoughts 0 0   ?PHQ-9 Score 0 2   ?  ? ? ?Social History  ? ?Tobacco Use  ?Smoking Status Former  ? Packs/day: 1.00  ? Years: 25.00  ? Pack years: 25.00  ? Types: Cigarettes  ? Quit date: 1995  ? Years since quitting: 28.2  ?Smokeless Tobacco Never  ? ?BP Readings from Last 3 Encounters:  ?10/30/21 138/85  ?09/13/21 110/68  ?09/06/21 108/62  ? ?Pulse Readings from Last 3 Encounters:  ?10/30/21 77  ?09/13/21 91  ?09/06/21 77  ? ?Wt Readings from Last 3 Encounters:  ?10/30/21 217 lb (98.4 kg)  ?08/21/21 277 lb 5.4 oz (125.8 kg)  ?08/08/21 282 lb 13.6 oz (128.3 kg)  ? ?BMI Readings from Last 3 Encounters:  ?10/30/21 29.43 kg/m?  ?09/13/21 37.61 kg/m?  ?08/21/21 37.61 kg/m?  ? ? ?Assessment/Interventions: Review of patient past medical history, allergies, medications, health status, including review of consultants reports, laboratory and other test data, was performed as part of comprehensive evaluation and provision of chronic care management services.  ? ?SDOH:  (Social Determinants of Health) assessments and interventions performed: Yes ?SDOH Interventions   ? ?Flowsheet Row  Most Recent Value  ?SDOH Interventions   ?Financial Strain Interventions Intervention Not Indicated  ?Transportation Interventions Other (Comment)  [Provided [facility] transportation phone number]  ? ?  ? ?SDOH Screenings

## 2021-12-28 DIAGNOSIS — Z48811 Encounter for surgical aftercare following surgery on the nervous system: Secondary | ICD-10-CM | POA: Diagnosis not present

## 2021-12-28 DIAGNOSIS — N179 Acute kidney failure, unspecified: Secondary | ICD-10-CM | POA: Diagnosis not present

## 2021-12-28 DIAGNOSIS — E1165 Type 2 diabetes mellitus with hyperglycemia: Secondary | ICD-10-CM | POA: Diagnosis not present

## 2021-12-28 DIAGNOSIS — Z9181 History of falling: Secondary | ICD-10-CM | POA: Diagnosis not present

## 2021-12-28 DIAGNOSIS — E1142 Type 2 diabetes mellitus with diabetic polyneuropathy: Secondary | ICD-10-CM | POA: Diagnosis not present

## 2021-12-28 DIAGNOSIS — I10 Essential (primary) hypertension: Secondary | ICD-10-CM | POA: Diagnosis not present

## 2021-12-28 DIAGNOSIS — D62 Acute posthemorrhagic anemia: Secondary | ICD-10-CM | POA: Diagnosis not present

## 2021-12-28 DIAGNOSIS — K5900 Constipation, unspecified: Secondary | ICD-10-CM | POA: Diagnosis not present

## 2021-12-28 DIAGNOSIS — Z87891 Personal history of nicotine dependence: Secondary | ICD-10-CM | POA: Diagnosis not present

## 2021-12-28 DIAGNOSIS — E785 Hyperlipidemia, unspecified: Secondary | ICD-10-CM | POA: Diagnosis not present

## 2022-01-01 ENCOUNTER — Encounter
Payer: Medicare Other | Attending: Physical Medicine and Rehabilitation | Admitting: Physical Medicine and Rehabilitation

## 2022-01-01 ENCOUNTER — Other Ambulatory Visit: Payer: Self-pay

## 2022-01-01 ENCOUNTER — Encounter: Payer: Self-pay | Admitting: Physical Medicine and Rehabilitation

## 2022-01-01 VITALS — BP 132/81 | HR 73 | Temp 98.4°F | Ht 72.0 in

## 2022-01-01 DIAGNOSIS — D62 Acute posthemorrhagic anemia: Secondary | ICD-10-CM | POA: Diagnosis not present

## 2022-01-01 DIAGNOSIS — R29898 Other symptoms and signs involving the musculoskeletal system: Secondary | ICD-10-CM | POA: Insufficient documentation

## 2022-01-01 DIAGNOSIS — Z9181 History of falling: Secondary | ICD-10-CM | POA: Diagnosis not present

## 2022-01-01 DIAGNOSIS — Z48811 Encounter for surgical aftercare following surgery on the nervous system: Secondary | ICD-10-CM | POA: Diagnosis not present

## 2022-01-01 DIAGNOSIS — G894 Chronic pain syndrome: Secondary | ICD-10-CM | POA: Insufficient documentation

## 2022-01-01 DIAGNOSIS — G9520 Unspecified cord compression: Secondary | ICD-10-CM | POA: Insufficient documentation

## 2022-01-01 DIAGNOSIS — I10 Essential (primary) hypertension: Secondary | ICD-10-CM | POA: Diagnosis not present

## 2022-01-01 DIAGNOSIS — E1142 Type 2 diabetes mellitus with diabetic polyneuropathy: Secondary | ICD-10-CM | POA: Diagnosis not present

## 2022-01-01 DIAGNOSIS — E1165 Type 2 diabetes mellitus with hyperglycemia: Secondary | ICD-10-CM | POA: Diagnosis not present

## 2022-01-01 DIAGNOSIS — M7918 Myalgia, other site: Secondary | ICD-10-CM | POA: Diagnosis not present

## 2022-01-01 DIAGNOSIS — Z87891 Personal history of nicotine dependence: Secondary | ICD-10-CM | POA: Diagnosis not present

## 2022-01-01 DIAGNOSIS — E785 Hyperlipidemia, unspecified: Secondary | ICD-10-CM | POA: Diagnosis not present

## 2022-01-01 DIAGNOSIS — K5900 Constipation, unspecified: Secondary | ICD-10-CM | POA: Diagnosis not present

## 2022-01-01 DIAGNOSIS — N179 Acute kidney failure, unspecified: Secondary | ICD-10-CM | POA: Diagnosis not present

## 2022-01-01 NOTE — Patient Instructions (Signed)
Plan: ?Patient here for trigger point injections for ? Consent done and on chart. ? ?Cleaned areas with alcohol and injected using a 27 gauge 1.5 inch needle ? ?Injected 2cc ?Using 1% Lidocaine with no EPI ? ?Upper traps ?Levators ?Posterior scalenes ?Middle scalenes ?Splenius Capitus ?Pectoralis Major ?Rhomboids L only x3 ?Infraspinatus ?Teres Major/minor ?Thoracic paraspinals L only x2 ?Lumbar paraspinals ?Other injections-  ? ? ?Patient's level of pain prior was 3-4/10 ?Current level of pain after injections is- loosening, but about the same ? ?There was no bleeding or complications. ? ?Patient was advised to drink a lot of water on day after injections to flush system ?Will have increased soreness for 12-48 hours after injections.  ?Can use Lidocaine patches the day AFTER injections ?Can use theracane on day of injections in places didn't inject ?Can use heating pad 4-6 hours AFTER injections  ? ?2. Marland Kitchen Doesn't need refills- has 3 month supply of Gabapentin Keppra and Cymbalta.  ? ?3. Still doing H/H- just renewed last week- so hasn't needed to go to outpatient yet- but when needs to, call me; and I can place outpatient therapy order for PT-  ? ?4. F/U in 3 months ?

## 2022-01-01 NOTE — Progress Notes (Signed)
Pt is a 71 yr old male with hx of HTN and DM and T11/12 lami/fusion by Dr Mellody Drown on 08/09/21-  Gets pain meds from Dr Brien Few ?Incomplete paraplegia/ASIA D thoracic myelopathy. ? ?Things going "OK". Not as tired as used to be.  ? ? ?Taking [facility] therapy 2x/week and walking with RW with them and at home. Can put clothes and shoes and socks on now; still doing bed bath ;cannot get upstairs so far- doing work to house right now so can get upstairs.  ? ? ? ?Trigger points aren't as severe as were in the hospital.  ?But start to creep up after walking 30 minutes. Feels that grab! But if sits in w/c longer than 1 hour, can feel stiffness/tightness in back- as long as sitting on chair or cough or bed, he's fine.  ? ?Had to sit down x1 due to "giving out" from trigger point pain.  ?Occurred beginning of February.  ? ?Still in hospital bed- gets up on R side- cannot get up on L side.  ? ?Still taking Oxycodone- 4x/day-Percocet- 1 tab q4 hours or so- 7.5/325 mg - per Dr Brien Few.  ? ?Valium- stopped- hasn't taken >1 month. Was making him super sleepy. Can deal without it.  ? ?Still taking Duloxetine, Gabapentin and Keppra- hasn't required trazodone in ~ 1 month either.  ? ? ? ?Plan: ?Patient here for trigger point injections for ? Consent done and on chart. ? ?Cleaned areas with alcohol and injected using a 27 gauge 1.5 inch needle ? ?Injected 2cc ?Using 1% Lidocaine with no EPI ? ?Upper traps ?Levators ?Posterior scalenes ?Middle scalenes ?Splenius Capitus ?Pectoralis Major ?Rhomboids L only x3 ?Infraspinatus ?Teres Major/minor ?Thoracic paraspinals L only x2 ?Lumbar paraspinals ?Other injections-  ? ? ?Patient's level of pain prior was 3-4/10 ?Current level of pain after injections is- loosening, but about the same ? ?There was no bleeding or complications. ? ?Patient was advised to drink a lot of water on day after injections to flush system ?Will have increased soreness for 12-48 hours after injections.  ?Can use Lidocaine  patches the day AFTER injections ?Can use theracane on day of injections in places didn't inject ?Can use heating pad 4-6 hours AFTER injections  ? ?2. Marland Kitchen Doesn't need refills- has 3 month supply of Gabapentin Keppra and Cymbalta.  ? ?3. Still doing H/H- just renewed last week- so hasn't needed to go to outpatient yet- but when needs to, call me; and I can place outpatient therapy order for PT-  ? ?4. F/U in 3 months ? ? ? ? ?

## 2022-01-03 DIAGNOSIS — Z9181 History of falling: Secondary | ICD-10-CM | POA: Diagnosis not present

## 2022-01-03 DIAGNOSIS — N179 Acute kidney failure, unspecified: Secondary | ICD-10-CM | POA: Diagnosis not present

## 2022-01-03 DIAGNOSIS — E785 Hyperlipidemia, unspecified: Secondary | ICD-10-CM | POA: Diagnosis not present

## 2022-01-03 DIAGNOSIS — E1165 Type 2 diabetes mellitus with hyperglycemia: Secondary | ICD-10-CM | POA: Diagnosis not present

## 2022-01-03 DIAGNOSIS — Z87891 Personal history of nicotine dependence: Secondary | ICD-10-CM | POA: Diagnosis not present

## 2022-01-03 DIAGNOSIS — K5900 Constipation, unspecified: Secondary | ICD-10-CM | POA: Diagnosis not present

## 2022-01-03 DIAGNOSIS — I10 Essential (primary) hypertension: Secondary | ICD-10-CM | POA: Diagnosis not present

## 2022-01-03 DIAGNOSIS — Z48811 Encounter for surgical aftercare following surgery on the nervous system: Secondary | ICD-10-CM | POA: Diagnosis not present

## 2022-01-03 DIAGNOSIS — E1142 Type 2 diabetes mellitus with diabetic polyneuropathy: Secondary | ICD-10-CM | POA: Diagnosis not present

## 2022-01-03 DIAGNOSIS — D62 Acute posthemorrhagic anemia: Secondary | ICD-10-CM | POA: Diagnosis not present

## 2022-01-05 ENCOUNTER — Telehealth: Payer: Self-pay | Admitting: Family Medicine

## 2022-01-05 DIAGNOSIS — E1140 Type 2 diabetes mellitus with diabetic neuropathy, unspecified: Secondary | ICD-10-CM | POA: Diagnosis not present

## 2022-01-05 DIAGNOSIS — I10 Essential (primary) hypertension: Secondary | ICD-10-CM

## 2022-01-05 NOTE — Telephone Encounter (Signed)
Verbal given to [employer].  ?

## 2022-01-05 NOTE — Telephone Encounter (Signed)
Charri with [facility] home [facility] called in to request verbal orders. The verbal orders are to expand PT for 2 week 3. ? ?Jerl Mina could be contacted at [phone]. ? ?Please advise. ?

## 2022-01-08 DIAGNOSIS — E1165 Type 2 diabetes mellitus with hyperglycemia: Secondary | ICD-10-CM | POA: Diagnosis not present

## 2022-01-08 DIAGNOSIS — Z48811 Encounter for surgical aftercare following surgery on the nervous system: Secondary | ICD-10-CM | POA: Diagnosis not present

## 2022-01-08 DIAGNOSIS — K219 Gastro-esophageal reflux disease without esophagitis: Secondary | ICD-10-CM | POA: Diagnosis not present

## 2022-01-08 DIAGNOSIS — F32A Depression, unspecified: Secondary | ICD-10-CM | POA: Diagnosis not present

## 2022-01-08 DIAGNOSIS — E785 Hyperlipidemia, unspecified: Secondary | ICD-10-CM | POA: Diagnosis not present

## 2022-01-08 DIAGNOSIS — I10 Essential (primary) hypertension: Secondary | ICD-10-CM | POA: Diagnosis not present

## 2022-01-08 DIAGNOSIS — E1142 Type 2 diabetes mellitus with diabetic polyneuropathy: Secondary | ICD-10-CM | POA: Diagnosis not present

## 2022-01-08 DIAGNOSIS — K5900 Constipation, unspecified: Secondary | ICD-10-CM | POA: Diagnosis not present

## 2022-01-09 ENCOUNTER — Ambulatory Visit (INDEPENDENT_AMBULATORY_CARE_PROVIDER_SITE_OTHER): Payer: Medicare Other

## 2022-01-09 VITALS — BP 150/90 | HR 77 | Temp 99.0°F | Ht 72.0 in | Wt 291.2 lb

## 2022-01-09 DIAGNOSIS — Z0000 Encounter for general adult medical examination without abnormal findings: Secondary | ICD-10-CM

## 2022-01-09 NOTE — Progress Notes (Signed)
?This visit occurred during the SARS-CoV-2 public health emergency.  Safety protocols were in place, including screening questions prior to the visit, additional usage of staff PPE, and extensive cleaning of exam room while observing appropriate contact time as indicated for disinfecting solutions. ? ?Subjective:  ? Keith [name] is a 71 y.o. male who presents for Medicare Annual/Subsequent preventive examination. ? ?Review of Systems    ? ?Cardiac Risk Factors include: advanced age (>38mn, >>12women);diabetes mellitus;dyslipidemia;hypertension;male gender;obesity (BMI >30kg/m2) ? ?   ?Objective:  ?  ?Today's Vitals  ? 01/09/22 1111 01/09/22 1122  ?BP: (!) 150/90   ?Pulse: 77   ?Temp: 99 ?F (37.2 ?C)   ?TempSrc: Oral   ?SpO2: 97%   ?Weight: 291 lb 3.2 oz (132.1 kg)   ?Height: 6' (1.829 m)   ?PainSc:  2   ? ?Body mass index is 39.49 kg/m?. ? ? ?  01/09/2022  ? 11:33 AM 08/21/2021  ?  4:31 PM 08/21/2021  ?  4:00 PM 01/03/2021  ? 11:26 AM 11/21/2020  ? 10:32 AM 11/19/2020  ?  3:13 AM 07/04/2020  ?  6:30 PM  ?Advanced Directives  ?Does Patient Have a Medical Advance Directive? Yes No Yes Yes Yes Yes Yes  ?Type of [place]   Healthcare Power of [place] will  Living will  ?Does patient want to make changes to medical advance directive?     No - Patient declined  No - Patient declined  ?Copy of [place]? No - copy requested   No - copy requested No - copy requested  No - copy requested  ?Would patient like information on creating a medical advance directive?  No - Patient declined       ? ? ?Current Medications (verified) ?Outpatient Encounter Medications as of 01/09/2022  ?Medication Sig  ? acetaminophen (TYLENOL) 325 MG tablet Take 1-2 tablets (325-650 mg total) by mouth every 4 (four) hours as needed for mild pain.  ? atenolol-chlorthalidone (TENORETIC) 50-25 MG tablet TAKE 1 TABLET BY MOUTH EVERY DAY  ?  cloNIDine (CATAPRES) 0.1 MG tablet TAKE 1 TABLET (0.1 MG TOTAL) BY MOUTH DAILY.  ? DULoxetine (CYMBALTA) 60 MG capsule TAKE 1 CAPSULE BY MOUTH EVERY DAY  ? gabapentin (NEURONTIN) 600 MG tablet TAKE 1 TABLET BY MOUTH THREE TIMES A DAY  ? levETIRAcetam (KEPPRA) 250 MG tablet TAKE 1 TABLET BY MOUTH TWICE A DAY  ? Multiple Vitamin (MULITIVITAMIN WITH MINERALS) TABS Take 1 tablet by mouth daily with breakfast.  ? oxyCODONE-acetaminophen (PERCOCET) 7.5-325 MG tablet Take 2 tablets by mouth every 6 (six) hours as needed for severe pain. Note that 120 pills were filled on 10/31--day prior to hospital admission. Taking this four times a day should be equal to using 10 mg three times a day as in the hospital. Can wean down as able.  ? polyethylene glycol (MIRALAX / GLYCOLAX) 17 g packet Take 17 g by mouth daily as needed for mild constipation.  ? Potassium 99 MG TABS Take 1 tablet by mouth daily.  ? simvastatin (ZOCOR) 40 MG tablet TAKE 1 TABLET BY MOUTH EVERY DAY  ? naloxone (NARCAN) nasal spray 4 mg/0.1 mL 1 spray once. (Patient not taking: Reported on 12/27/2021)  ? [DISCONTINUED] apixaban (ELIQUIS) 5 MG TABS tablet Take 1 tablet (5 mg total) by mouth 2 (two) times daily. (Patient not taking: No sig reported)  ? ?No facility-administered encounter medications on file as  of 01/09/2022.  ? ? ?Allergies (verified) ?Aspirin and Lisinopril  ? ?History: ?Past Medical History:  ?Diagnosis Date  ? Allergy   ? Chronic back pain   ? Diabetes mellitus without complication ([place])   ? DNR (do not resuscitate) 11/19/2020  ? Duodenal ulcer hemorrhage 08/29/2014  ? ED (erectile dysfunction)   ? Esophageal stricture 08/30/2014  ? Glucose intolerance (impaired glucose tolerance)   ? Hiatal hernia 08/30/2014  ? Hypertension   ? Hypokalemia 04/11/2013  ? MRSA carrier 08/30/2014  ? Obesity   ? Osteoarthritis   ? Pneumonia   ? Pulmonary embolism ([place]) 06/2020  ? Scoliosis 2016  ? Spinal stenosis of lumbar region   ? Urinary tract infection  04/11/2013  ? ?Past Surgical History:  ?Procedure Laterality Date  ? BACK SURGERY  08/07/2021  ? COLONOSCOPY  2008  ? ESOPHAGOGASTRODUODENOSCOPY N/A 08/29/2014  ? Procedure: ESOPHAGOGASTRODUODENOSCOPY (EGD);  Surgeon: Lafayette Dragon, MD;  Location: [facility];  Service: Endoscopy;  Laterality: N/A;  ? LAMINECTOMY N/A 07/28/2018  ? Procedure: THORACIC ELEVEN- THORACIC TWELVE POSTERIOR DECOMPRESSION LAMINECTOMY;  Surgeon: Judith Part, MD;  Location: [facility];  Service: Neurosurgery;  Laterality: N/A;  ? LUMBAR LAMINECTOMY    ? NASAL POLYP SURGERY    ? ORIF ANKLE FRACTURE Right 07/01/2020  ? Procedure: OPEN REDUCTION INTERNAL FIXATION (ORIF) ANKLE FRACTURE. TRIMALLEOLAR;  Surgeon: Marchia Bond, MD;  Location: [facility];  Service: Orthopedics;  Laterality: Right;  ? TONSILLECTOMY    ? ?Family History  ?Problem Relation Age of Onset  ? Diabetes Mother   ? Asthma Mother   ? Hypertension Father   ? Stroke Father   ? Colon cancer Neg Hx   ? ?Social History  ? ?Socioeconomic History  ? Marital status: Married  ?  Spouse name: Not on file  ? Number of children: Not on file  ? Years of education: Not on file  ? Highest education level: Not on file  ?Occupational History  ? Occupation: retired  ?Tobacco Use  ? Smoking status: Former  ?  Packs/day: 1.00  ?  Years: 25.00  ?  Pack years: 25.00  ?  Types: Cigarettes  ?  Quit date: 1995  ?  Years since quitting: 28.2  ? Smokeless tobacco: Never  ?Vaping Use  ? Vaping Use: Never used  ?Substance and Sexual Activity  ? Alcohol use: Not Currently  ?  Comment: holidays and special occasions  ? Drug use: Yes  ?  Types: Oxycodone  ? Sexual activity: Yes  ?  Birth control/protection: None  ?Other Topics Concern  ? Not on file  ?Social History Narrative  ? Married.    ? ?Social Determinants of Health  ? ?Financial Resource Strain: Low Risk   ? Difficulty of Paying Living Expenses: Not hard at all  ?Food Insecurity: No Food Insecurity  ? Worried About Charity fundraiser in the Last  Year: Never true  ? Ran Out of Food in the Last Year: Never true  ?Transportation Needs: No Transportation Needs  ? Lack of Transportation (Medical): No  ? Lack of Transportation (Non-Medical): No  ?Physical Activity: Sufficiently Active  ? Days of Exercise per Week: 7 days  ? Minutes of Exercise per Session: 30 min  ?Stress: No Stress Concern Present  ? Feeling of Stress : Not at all  ?Social Connections: Not on file  ? ? ?Tobacco Counseling ?Counseling given: Not Answered ? ? ?Clinical Intake: ? ?Pre-visit preparation completed: Yes ? ?Pain : 0-10 ?Pain  Score: 2  ?Pain Type: Chronic pain ?Pain Location: Generalized ?Pain Descriptors / Indicators: Aching ?Pain Onset: More than a month ago ?Pain Frequency: Constant ? ?  ? ?Nutritional Status: BMI > 30  Obese ?Nutritional Risks: None ?Diabetes: Yes ? ?How often do you need to have someone help you when you read instructions, pamphlets, or other written materials from your doctor or pharmacy?: 1 - Never ?What is the last grade level you completed in school?: college ? ?Diabetic? Yes ?Nutrition Risk Assessment: ? ?Has the patient had any N/V/D within the last 2 months?  No  ?Does the patient have any non-healing wounds?  No  ?Has the patient had any unintentional weight loss or weight gain?  No  ? ?Diabetes: ? ?Is the patient diabetic?  Yes  ?If diabetic, was a CBG obtained today?  No  ?Did the patient bring in their glucometer from home?  No  ?How often do you monitor your CBG's? Every two weeks.  ? ?Financial Strains and Diabetes Management: ? ?Are you having any financial strains with the device, your supplies or your medication? No .  ?Does the patient want to be seen by Chronic Care Management for management of their diabetes?  No  ?Would the patient like to be referred to a Nutritionist or for Diabetic Management?  No  ? ?Diabetic Exams: ? ?Diabetic Eye Exam: Completed 2022 ?Diabetic Foot Exam: Overdue, Pt has been advised about the importance in completing this  exam. Pt is scheduled for diabetic foot exam on next appointment. ? ? ?Interpreter Needed?: No ? ?Information entered by :: NAllen LPN ? ? ?Activities of Daily Living ? ?  01/09/2022  ? 11:34 AM 08/21/2021  ?  4:34 PM

## 2022-01-09 NOTE — Patient Instructions (Signed)
Keith [name] , ?Thank you for taking time to come for your Medicare Wellness Visit. I appreciate your ongoing commitment to your health goals. Please review the following plan we discussed and let me know if I can assist you in the future.  ? ?Screening recommendations/referrals: ?Colonoscopy: completed 01/20/2018, due 01/21/2023 ?Recommended yearly ophthalmology/optometry visit for glaucoma screening and checkup ?Recommended yearly dental visit for hygiene and checkup ? ?Vaccinations: ?Influenza vaccine: decline ?Pneumococcal vaccine: completed 11/24/2020 ?Tdap vaccine: completed 11/09/2013, due 11/10/2023 ?Shingles vaccine: discussed   ?Covid-19:  09/24/2020, 01/09/2020, 12/20/2019 ? ?Advanced directives: Please bring a copy of your POA (Power of Attorney) and/or Living Will to your next appointment.  ? ?Conditions/risks identified: none ? ?Next appointment: Follow up in one year for your annual wellness visit.  ? ?Preventive Care 62 Years and Older, Male ?Preventive care refers to lifestyle choices and visits with your health care provider that can promote health and wellness. ?What does preventive care include? ?A yearly physical exam. This is also called an annual well check. ?Dental exams once or twice a year. ?Routine eye exams. Ask your health care provider how often you should have your eyes checked. ?Personal lifestyle choices, including: ?Daily care of your teeth and gums. ?Regular physical activity. ?Eating a healthy diet. ?Avoiding tobacco and drug use. ?Limiting alcohol use. ?Practicing safe sex. ?Taking low doses of aspirin every day. ?Taking vitamin and mineral supplements as recommended by your health care provider. ?What happens during an annual well check? ?The services and screenings done by your health care provider during your annual well check will depend on your age, overall health, lifestyle risk factors, and family history of disease. ?Counseling  ?Your health care provider may ask you questions about  your: ?Alcohol use. ?Tobacco use. ?Drug use. ?Emotional well-being. ?Home and relationship well-being. ?Sexual activity. ?Eating habits. ?History of falls. ?Memory and ability to understand (cognition). ?Work and work Statistician. ?Screening  ?You may have the following tests or measurements: ?Height, weight, and BMI. ?Blood pressure. ?Lipid and cholesterol levels. These may be checked every 5 years, or more frequently if you are over 66 years old. ?Skin check. ?Lung cancer screening. You may have this screening every year starting at age 21 if you have a 30-pack-year history of smoking and currently smoke or have quit within the past 15 years. ?Fecal occult blood test (FOBT) of the stool. You may have this test every year starting at age 46. ?Flexible sigmoidoscopy or colonoscopy. You may have a sigmoidoscopy every 5 years or a colonoscopy every 10 years starting at age 45. ?Prostate cancer screening. Recommendations will vary depending on your family history and other risks. ?Hepatitis C blood test. ?Hepatitis B blood test. ?Sexually transmitted disease (STD) testing. ?Diabetes screening. This is done by checking your blood sugar (glucose) after you have not eaten for a while (fasting). You may have this done every 1-3 years. ?Abdominal aortic aneurysm (AAA) screening. You may need this if you are a current or former smoker. ?Osteoporosis. You may be screened starting at age 53 if you are at high risk. ?Talk with your health care provider about your test results, treatment options, and if necessary, the need for more tests. ?Vaccines  ?Your health care provider may recommend certain vaccines, such as: ?Influenza vaccine. This is recommended every year. ?Tetanus, diphtheria, and acellular pertussis (Tdap, Td) vaccine. You may need a Td booster every 10 years. ?Zoster vaccine. You may need this after age 58. ?Pneumococcal 13-valent conjugate (PCV13) vaccine. One dose is  recommended after age 69. ?Pneumococcal  polysaccharide (PPSV23) vaccine. One dose is recommended after age 42. ?Talk to your health care provider about which screenings and vaccines you need and how often you need them. ?This information is not intended to replace advice given to you by your health care provider. Make sure you discuss any questions you have with your health care provider. ?Document Released: 10/21/2015 Document Revised: 06/13/2016 Document Reviewed: 07/26/2015 ?Elsevier Interactive Patient Education ? 2017 [place]. ? ?Fall Prevention in the Home ?Falls can cause injuries. They can happen to people of all ages. There are many things you can do to make your home safe and to help prevent falls. ?What can I do on the outside of my home? ?Regularly fix the edges of walkways and driveways and fix any cracks. ?Remove anything that might make you trip as you walk through a door, such as a raised step or threshold. ?Trim any bushes or trees on the path to your home. ?Use bright outdoor lighting. ?Clear any walking paths of anything that might make someone trip, such as rocks or tools. ?Regularly check to see if handrails are loose or broken. Make sure that both sides of any steps have handrails. ?Any raised decks and porches should have guardrails on the edges. ?Have any leaves, snow, or ice cleared regularly. ?Use sand or salt on walking paths during winter. ?Clean up any spills in your garage right away. This includes oil or grease spills. ?What can I do in the bathroom? ?Use night lights. ?Install grab bars by the toilet and in the tub and shower. Do not use towel bars as grab bars. ?Use non-skid mats or decals in the tub or shower. ?If you need to sit down in the shower, use a plastic, non-slip stool. ?Keep the floor dry. Clean up any water that spills on the floor as soon as it happens. ?Remove soap buildup in the tub or shower regularly. ?Attach bath mats securely with double-sided non-slip rug tape. ?Do not have throw rugs and other  things on the floor that can make you trip. ?What can I do in the bedroom? ?Use night lights. ?Make sure that you have a light by your bed that is easy to reach. ?Do not use any sheets or blankets that are too big for your bed. They should not hang down onto the floor. ?Have a firm chair that has side arms. You can use this for support while you get dressed. ?Do not have throw rugs and other things on the floor that can make you trip. ?What can I do in the kitchen? ?Clean up any spills right away. ?Avoid walking on wet floors. ?Keep items that you use a lot in easy-to-reach places. ?If you need to reach something above you, use a strong step stool that has a grab bar. ?Keep electrical cords out of the way. ?Do not use floor polish or wax that makes floors slippery. If you must use wax, use non-skid floor wax. ?Do not have throw rugs and other things on the floor that can make you trip. ?What can I do with my stairs? ?Do not leave any items on the stairs. ?Make sure that there are handrails on both sides of the stairs and use them. Fix handrails that are broken or loose. Make sure that handrails are as long as the stairways. ?Check any carpeting to make sure that it is firmly attached to the stairs. Fix any carpet that is loose or worn. ?Avoid having  throw rugs at the top or bottom of the stairs. If you do have throw rugs, attach them to the floor with carpet tape. ?Make sure that you have a light switch at the top of the stairs and the bottom of the stairs. If you do not have them, ask someone to add them for you. ?What else can I do to help prevent falls? ?Wear shoes that: ?Do not have high heels. ?Have rubber bottoms. ?Are comfortable and fit you well. ?Are closed at the toe. Do not wear sandals. ?If you use a stepladder: ?Make sure that it is fully opened. Do not climb a closed stepladder. ?Make sure that both sides of the stepladder are locked into place. ?Ask someone to hold it for you, if possible. ?Clearly  mark and make sure that you can see: ?Any grab bars or handrails. ?First and last steps. ?Where the edge of each step is. ?Use tools that help you move around (mobility aids) if they are needed. These include:

## 2022-01-10 DIAGNOSIS — K5900 Constipation, unspecified: Secondary | ICD-10-CM | POA: Diagnosis not present

## 2022-01-10 DIAGNOSIS — E785 Hyperlipidemia, unspecified: Secondary | ICD-10-CM | POA: Diagnosis not present

## 2022-01-10 DIAGNOSIS — Z48811 Encounter for surgical aftercare following surgery on the nervous system: Secondary | ICD-10-CM | POA: Diagnosis not present

## 2022-01-10 DIAGNOSIS — I10 Essential (primary) hypertension: Secondary | ICD-10-CM | POA: Diagnosis not present

## 2022-01-10 DIAGNOSIS — K219 Gastro-esophageal reflux disease without esophagitis: Secondary | ICD-10-CM | POA: Diagnosis not present

## 2022-01-10 DIAGNOSIS — E1165 Type 2 diabetes mellitus with hyperglycemia: Secondary | ICD-10-CM | POA: Diagnosis not present

## 2022-01-10 DIAGNOSIS — E1142 Type 2 diabetes mellitus with diabetic polyneuropathy: Secondary | ICD-10-CM | POA: Diagnosis not present

## 2022-01-10 DIAGNOSIS — F32A Depression, unspecified: Secondary | ICD-10-CM | POA: Diagnosis not present

## 2022-01-14 DIAGNOSIS — M4714 Other spondylosis with myelopathy, thoracic region: Secondary | ICD-10-CM | POA: Diagnosis not present

## 2022-01-15 NOTE — Progress Notes (Signed)
? ?HPI: ?[name] is a 71 y.o.male here today with his wife for his routine physical examination. ? ?Last CPE: Over a year ago. ?AWV on 01/09/22. ? ?Regular exercise: Limitations due to chronic back pain and unstable gait. He is doing PT 2 times per week. He uses a wheel chair when going out, at home he has a walker. ? ?Following a healthful diet: He is trying to do better, he stopped ice cream intake about a month ago but states that he eats out "too much." He has noted wt loss. ? ?Chronic medical problems: DM II,chronic pain,HLD,HTN, and peripheral neuropathy among some. ? ?Immunization History  ?Administered Date(s) Administered  ? PFIZER(Purple Top)SARS-COV-2 Vaccination 12/20/2019, 01/09/2020, 09/24/2020  ? Pneumococcal Conjugate-13 11/11/2017, 11/24/2020  ? Pneumococcal Polysaccharide-23 11/09/2014, 03/30/2020  ? Td 10/09/2003  ? Tdap 11/09/2013  ? ? ?Health Maintenance  ?Topic Date Due  ? OPHTHALMOLOGY EXAM  Never done  ? COVID-19 Vaccine (4 - Booster for [place] series) 02/02/2022 (Originally 11/19/2020)  ? Zoster Vaccines- Shingrix (1 of 2) 10/11/2022 (Originally 07/20/1970)  ? INFLUENZA VACCINE  05/08/2022  ? HEMOGLOBIN A1C  07/19/2022  ? FOOT EXAM  01/18/2023  ? COLONOSCOPY (Pts 45-77yr Insurance coverage will need to be confirmed)  01/21/2023  ? TETANUS/TDAP  11/10/2023  ? Pneumonia Vaccine 70 Years old  Completed  ? Hepatitis C Screening  Completed  ? HPV VACCINES  Aged Out  ? ?Last prostate ca screening:  ?Nocturia x 2, stable for years. ? PSA 0.53 11/11/2017  ? ?Former smoker, quit in 1995. ? ?Diabetes Mellitus II:  ?He is on non pharmacologic treatment. ?Metformin was discontinued during last hospitalization, 09/2011 ?Not checking BS's. ?Negative for polydipsia,polyuria, or polyphagia. ? ?Burning and numbness feet sensation, residual after back surgery,improved. ?He is on Gabapentin and Duloxetine. ?He follows with pain management, on chronic opioid use. ? ?Lab Results  ?Component Value  Date  ? HGBA1C 6.0 09/13/2021  ? ?Lab Results  ?Component Value Date  ? MICROALBUR 8.1 (H) 03/30/2020  ? ?Hypertension:  ?Medications: Atenolol-Chlorthalidone 50-25 mg daily and clonidine 0.1 mg daily. ?Lab Results  ?Component Value Date  ? CREATININE 1.91 (H) 09/13/2021  ? BUN 43 (H) 09/13/2021  ? NA 139 09/13/2021  ? K 3.6 09/13/2021  ? CL 105 09/13/2021  ? CO2 26 09/13/2021  ? ?Hyperlipidemia: ?Currently on Simvastatin 40 mg daily. ?Side effects from medication:none ? ?Lab Results  ?Component Value Date  ? CHOL 145 08/01/2020  ? HDL 44 08/01/2020  ? LGreenevers82 08/01/2020  ? TRIG 92 08/01/2020  ? CHOLHDL 3.3 08/01/2020  ? ?Review of Systems  ?Constitutional:  Negative for activity change, appetite change and fever.  ?HENT:  Negative for nosebleeds and sore throat.   ?Eyes:  Negative for redness and visual disturbance.  ?Respiratory:  Negative for cough, shortness of breath and wheezing.   ?Cardiovascular:  Positive for leg swelling. Negative for chest pain and palpitations.  ?Gastrointestinal:  Negative for abdominal pain, nausea and vomiting.  ?Endocrine: Negative for cold intolerance and heat intolerance.  ?Genitourinary:  Negative for decreased urine volume, dysuria, genital sores, hematuria and testicular pain.  ?Musculoskeletal:  Positive for arthralgias, back pain and gait problem.  ?Skin:  Negative for color change and rash.  ?Neurological:  Negative for syncope, weakness and headaches.  ?Hematological:  Negative for adenopathy. Does not bruise/bleed easily.  ?Psychiatric/Behavioral:  Negative for behavioral problems and confusion.   ?All other systems reviewed and are negative. ? ?Current Outpatient Medications on File  Prior to Visit  ?Medication Sig Dispense Refill  ? acetaminophen (TYLENOL) 325 MG tablet Take 1-2 tablets (325-650 mg total) by mouth every 4 (four) hours as needed for mild pain.    ? atenolol-chlorthalidone (TENORETIC) 50-25 MG tablet TAKE 1 TABLET BY MOUTH EVERY DAY 90 tablet 2  ?  cloNIDine (CATAPRES) 0.1 MG tablet TAKE 1 TABLET (0.1 MG TOTAL) BY MOUTH DAILY. 90 tablet 2  ? DULoxetine (CYMBALTA) 60 MG capsule TAKE 1 CAPSULE BY MOUTH EVERY DAY 90 capsule 2  ? gabapentin (NEURONTIN) 600 MG tablet TAKE 1 TABLET BY MOUTH THREE TIMES A DAY 90 tablet 5  ? levETIRAcetam (KEPPRA) 250 MG tablet TAKE 1 TABLET BY MOUTH TWICE A DAY 180 tablet 1  ? Multiple Vitamin (MULITIVITAMIN WITH MINERALS) TABS Take 1 tablet by mouth daily with breakfast.    ? naloxone (NARCAN) nasal spray 4 mg/0.1 mL 1 spray once.    ? oxyCODONE-acetaminophen (PERCOCET) 7.5-325 MG tablet Take 2 tablets by mouth every 6 (six) hours as needed for severe pain. Note that 120 pills were filled on 10/31--day prior to hospital admission. Taking this four times a day should be equal to using 10 mg three times a day as in the hospital. Can wean down as able. 30 tablet 0  ? polyethylene glycol (MIRALAX / GLYCOLAX) 17 g packet Take 17 g by mouth daily as needed for mild constipation.    ? Potassium 99 MG TABS Take 1 tablet by mouth daily.    ? simvastatin (ZOCOR) 40 MG tablet TAKE 1 TABLET BY MOUTH EVERY DAY 90 tablet 2  ? [DISCONTINUED] apixaban (ELIQUIS) 5 MG TABS tablet Take 1 tablet (5 mg total) by mouth 2 (two) times daily. (Patient not taking: No sig reported) 60 tablet 01  ? ?No current facility-administered medications on file prior to visit.  ? ?Past Medical History:  ?Diagnosis Date  ? Allergy   ? Chronic back pain   ? Diabetes mellitus without complication ([place])   ? DNR (do not resuscitate) 11/19/2020  ? Duodenal ulcer hemorrhage 08/29/2014  ? ED (erectile dysfunction)   ? Esophageal stricture 08/30/2014  ? Glucose intolerance (impaired glucose tolerance)   ? Hiatal hernia 08/30/2014  ? Hypertension   ? Hypokalemia 04/11/2013  ? MRSA carrier 08/30/2014  ? Obesity   ? Osteoarthritis   ? Pneumonia   ? Pulmonary embolism ([place]) 06/2020  ? Scoliosis 2016  ? Spinal stenosis of lumbar region   ? Urinary tract infection 04/11/2013  ? ?Past  Surgical History:  ?Procedure Laterality Date  ? BACK SURGERY  08/07/2021  ? COLONOSCOPY  2008  ? ESOPHAGOGASTRODUODENOSCOPY N/A 08/29/2014  ? Procedure: ESOPHAGOGASTRODUODENOSCOPY (EGD);  Surgeon: Lafayette Dragon, MD;  Location: [facility];  Service: Endoscopy;  Laterality: N/A;  ? LAMINECTOMY N/A 07/28/2018  ? Procedure: THORACIC ELEVEN- THORACIC TWELVE POSTERIOR DECOMPRESSION LAMINECTOMY;  Surgeon: Judith Part, MD;  Location: [facility];  Service: Neurosurgery;  Laterality: N/A;  ? LUMBAR LAMINECTOMY    ? NASAL POLYP SURGERY    ? ORIF ANKLE FRACTURE Right 07/01/2020  ? Procedure: OPEN REDUCTION INTERNAL FIXATION (ORIF) ANKLE FRACTURE. TRIMALLEOLAR;  Surgeon: Marchia Bond, MD;  Location: [facility];  Service: Orthopedics;  Laterality: Right;  ? TONSILLECTOMY    ? ? ?Allergies  ?Allergen Reactions  ? Aspirin Other (See Comments)  ?  Irritates the stomach and the patient developed ulcers, also  ? Lisinopril Other (See Comments)  ?  Caused a body ache  ? ? ?Family History  ?Problem  Relation Age of Onset  ? Diabetes Mother   ? Asthma Mother   ? Hypertension Father   ? Stroke Father   ? Colon cancer Neg Hx   ? ? ?Social History  ? ?Socioeconomic History  ? Marital status: Married  ?  Spouse name: Not on file  ? Number of children: Not on file  ? Years of education: Not on file  ? Highest education level: Not on file  ?Occupational History  ? Occupation: retired  ?Tobacco Use  ? Smoking status: Former  ?  Packs/day: 1.00  ?  Years: 25.00  ?  Pack years: 25.00  ?  Types: Cigarettes  ?  Quit date: 1995  ?  Years since quitting: 28.3  ? Smokeless tobacco: Never  ?Vaping Use  ? Vaping Use: Never used  ?Substance and Sexual Activity  ? Alcohol use: Not Currently  ?  Comment: holidays and special occasions  ? Drug use: Yes  ?  Types: Oxycodone  ? Sexual activity: Yes  ?  Birth control/protection: None  ?Other Topics Concern  ? Not on file  ?Social History Narrative  ? Married.    ? ?Social Determinants of Health  ? ?Financial  Resource Strain: Low Risk   ? Difficulty of Paying Living Expenses: Not hard at all  ?Food Insecurity: No Food Insecurity  ? Worried About Charity fundraiser in the Last Year: Never true  ? Ran Out of Food in

## 2022-01-16 DIAGNOSIS — E785 Hyperlipidemia, unspecified: Secondary | ICD-10-CM | POA: Diagnosis not present

## 2022-01-16 DIAGNOSIS — E1142 Type 2 diabetes mellitus with diabetic polyneuropathy: Secondary | ICD-10-CM | POA: Diagnosis not present

## 2022-01-16 DIAGNOSIS — K5900 Constipation, unspecified: Secondary | ICD-10-CM | POA: Diagnosis not present

## 2022-01-16 DIAGNOSIS — Z48811 Encounter for surgical aftercare following surgery on the nervous system: Secondary | ICD-10-CM | POA: Diagnosis not present

## 2022-01-16 DIAGNOSIS — I10 Essential (primary) hypertension: Secondary | ICD-10-CM | POA: Diagnosis not present

## 2022-01-16 DIAGNOSIS — F32A Depression, unspecified: Secondary | ICD-10-CM | POA: Diagnosis not present

## 2022-01-16 DIAGNOSIS — K219 Gastro-esophageal reflux disease without esophagitis: Secondary | ICD-10-CM | POA: Diagnosis not present

## 2022-01-16 DIAGNOSIS — E1165 Type 2 diabetes mellitus with hyperglycemia: Secondary | ICD-10-CM | POA: Diagnosis not present

## 2022-01-17 ENCOUNTER — Encounter: Payer: Self-pay | Admitting: Family Medicine

## 2022-01-17 ENCOUNTER — Ambulatory Visit (INDEPENDENT_AMBULATORY_CARE_PROVIDER_SITE_OTHER): Payer: Medicare Other | Admitting: Family Medicine

## 2022-01-17 VITALS — BP 130/80 | HR 73 | Resp 16 | Ht 72.0 in

## 2022-01-17 DIAGNOSIS — E1169 Type 2 diabetes mellitus with other specified complication: Secondary | ICD-10-CM

## 2022-01-17 DIAGNOSIS — I10 Essential (primary) hypertension: Secondary | ICD-10-CM

## 2022-01-17 DIAGNOSIS — Z125 Encounter for screening for malignant neoplasm of prostate: Secondary | ICD-10-CM | POA: Diagnosis not present

## 2022-01-17 DIAGNOSIS — E1140 Type 2 diabetes mellitus with diabetic neuropathy, unspecified: Secondary | ICD-10-CM

## 2022-01-17 DIAGNOSIS — E785 Hyperlipidemia, unspecified: Secondary | ICD-10-CM

## 2022-01-17 DIAGNOSIS — Z0000 Encounter for general adult medical examination without abnormal findings: Secondary | ICD-10-CM

## 2022-01-17 LAB — PSA: PSA: 0.53 ng/mL (ref 0.10–4.00)

## 2022-01-17 LAB — COMPREHENSIVE METABOLIC PANEL
ALT: 19 U/L (ref 0–53)
AST: 24 U/L (ref 0–37)
Albumin: 4.4 g/dL (ref 3.5–5.2)
Alkaline Phosphatase: 81 U/L (ref 39–117)
BUN: 30 mg/dL — ABNORMAL HIGH (ref 6–23)
CO2: 28 mEq/L (ref 19–32)
Calcium: 9.9 mg/dL (ref 8.4–10.5)
Chloride: 105 mEq/L (ref 96–112)
Creatinine, Ser: 1.72 mg/dL — ABNORMAL HIGH (ref 0.40–1.50)
GFR: 40.06 mL/min — ABNORMAL LOW (ref >60.00)
Glucose, Bld: 111 mg/dL — ABNORMAL HIGH (ref 70–99)
Potassium: 4 mEq/L (ref 3.5–5.1)
Sodium: 142 mEq/L (ref 135–145)
Total Bilirubin: 0.3 mg/dL (ref 0.2–1.2)
Total Protein: 8 g/dL (ref 6.0–8.3)

## 2022-01-17 LAB — LIPID PANEL
Cholesterol: 140 mg/dL (ref 0–200)
HDL: 50.8 mg/dL (ref >39.00)
LDL Cholesterol: 73 mg/dL (ref 0–99)
NonHDL: 89.34
Total CHOL/HDL Ratio: 3
Triglycerides: 81 mg/dL (ref 0.0–149.0)
VLDL: 16.2 mg/dL (ref 0.0–40.0)

## 2022-01-17 LAB — HEMOGLOBIN A1C: Hgb A1c MFr Bld: 6.6 % — ABNORMAL HIGH (ref 4.6–6.5)

## 2022-01-17 LAB — MICROALBUMIN / CREATININE URINE RATIO
Creatinine,U: 157.8 mg/dL
Microalb Creat Ratio: 7.1 mg/g (ref 0.0–30.0)
Microalb, Ur: 11.3 mg/dL — ABNORMAL HIGH (ref 0.0–1.9)

## 2022-01-17 MED ORDER — ACCU-CHEK AVIVA PLUS W/DEVICE KIT: PACK | 0 refills | Status: DC

## 2022-01-17 NOTE — Patient Instructions (Addendum)
A few things to remember from today's visit: ? ? ?Routine general medical examination at a health care facility ? ?Type 2 diabetes mellitus with diabetic neuropathy, without long-term current use of insulin ([place]) - Plan: Blood Glucose Monitoring Suppl (ACCU-CHEK AVIVA PLUS) w/Device KIT, Comprehensive metabolic panel, Hemoglobin A1c, Microalbumin / creatinine urine ratio ? ?Essential hypertension ? ?Hyperlipidemia associated with type 2 diabetes mellitus ([place]) - Plan: Lipid panel ? ?Prostate cancer screening - Plan: PSA(Must document that pt has been informed of limitations of PSA testing.) ? ?If you need refills please call your pharmacy. ?Do not use My Chart to request refills or for acute issues that need immediate attention. ?  ? ?Please be sure medication list is accurate. ?If a new problem present, please set up appointment sooner than planned today. ?Preventive Care 9 Years and Older, Male ?Preventive care refers to lifestyle choices and visits with your health care provider that can promote health and wellness. Preventive care visits are also called wellness exams. ?What can I expect for my preventive care visit? ?Counseling ?During your preventive care visit, your health care provider may ask about your: ?Medical history, including: ?Past medical problems. ?Family medical history. ?History of falls. ?Current health, including: ?Emotional well-being. ?Home life and relationship well-being. ?Sexual activity. ?Memory and ability to understand (cognition). ?Lifestyle, including: ?Alcohol, nicotine or tobacco, and drug use. ?Access to firearms. ?Diet, exercise, and sleep habits. ?Work and work Statistician. ?Sunscreen use. ?Safety issues such as seatbelt and bike helmet use. ?Physical exam ?Your health care provider will check your: ?Height and weight. These may be used to calculate your BMI (body mass index). BMI is a measurement that tells if you are at a healthy weight. ?Waist circumference. This measures the  distance around your waistline. This measurement also tells if you are at a healthy weight and may help predict your risk of certain diseases, such as type 2 diabetes and high blood pressure. ?Heart rate and blood pressure. ?Body temperature. ?Skin for abnormal spots. ?What immunizations do I need? ?Vaccines are usually given at various ages, according to a schedule. Your health care provider will recommend vaccines for you based on your age, medical history, and lifestyle or other factors, such as travel or where you work. ?What tests do I need? ?Screening ?Your health care provider may recommend screening tests for certain conditions. This may include: ?Lipid and cholesterol levels. ?Diabetes screening. This is done by checking your blood sugar (glucose) after you have not eaten for a while (fasting). ?Hepatitis C test. ?Hepatitis B test. ?HIV (human immunodeficiency virus) test. ?STI (sexually transmitted infection) testing, if you are at risk. ?Lung cancer screening. ?Colorectal cancer screening. ?Prostate cancer screening. ?Abdominal aortic aneurysm (AAA) screening. You may need this if you are a current or former smoker. ?Talk with your health care provider about your test results, treatment options, and if necessary, the need for more tests. ?Follow these instructions at home: ?Eating and drinking ? ?Eat a diet that includes fresh fruits and vegetables, whole grains, lean protein, and low-fat dairy products. Limit your intake of foods with high amounts of sugar, saturated fats, and salt. ?Take vitamin and mineral supplements as recommended by your health care provider. ?Do not drink alcohol if your health care provider tells you not to drink. ?If you drink alcohol: ?Limit how much you have to 0-2 drinks a day. ?Know how much alcohol is in your drink. In the [place] one drink equals one 12 oz bottle of beer (355 mL), one 5  oz glass of wine (148 mL), or one 1? oz glass of hard liquor (44 mL). ?Lifestyle ?Brush  your teeth every morning and night with fluoride toothpaste. Floss one time each day. ?Exercise for at least 30 minutes 5 or more days each week. ?Do not use any products that contain nicotine or tobacco. These products include cigarettes, chewing tobacco, and vaping devices, such as e-cigarettes. If you need help quitting, ask your health care provider. ?Do not use drugs. ?If you are sexually active, practice safe sex. Use a condom or other form of protection to prevent STIs. ?Take aspirin only as told by your health care provider. Make sure that you understand how much to take and what form to take. Work with your health care provider to find out whether it is safe and beneficial for you to take aspirin daily. ?Ask your health care provider if you need to take a cholesterol-lowering medicine (statin). ?Find healthy ways to manage stress, such as: ?Meditation, yoga, or listening to music. ?Journaling. ?Talking to a trusted person. ?Spending time with friends and family. ?Safety ?Always wear your seat belt while driving or riding in a vehicle. ?Do not drive: ?If you have been drinking alcohol. Do not ride with someone who has been drinking. ?When you are tired or distracted. ?While texting. ?If you have been using any mind-altering substances or drugs. ?Wear a helmet and other protective equipment during sports activities. ?If you have firearms in your house, make sure you follow all gun safety procedures. ?Minimize exposure to UV radiation to reduce your risk of skin cancer. ?What's next? ?Visit your health care provider once a year for an annual wellness visit. ?Ask your health care provider how often you should have your eyes and teeth checked. ?Stay up to date on all vaccines. ?This information is not intended to replace advice given to you by your health care provider. Make sure you discuss any questions you have with your health care provider. ?Document Revised: 03/22/2021 Document Reviewed:  03/22/2021 ?Elsevier Patient Education ? 2022 [place]. ? ? ? ? ? ? ? ?

## 2022-01-18 DIAGNOSIS — K5900 Constipation, unspecified: Secondary | ICD-10-CM | POA: Diagnosis not present

## 2022-01-18 DIAGNOSIS — Z48811 Encounter for surgical aftercare following surgery on the nervous system: Secondary | ICD-10-CM | POA: Diagnosis not present

## 2022-01-18 DIAGNOSIS — E1142 Type 2 diabetes mellitus with diabetic polyneuropathy: Secondary | ICD-10-CM | POA: Diagnosis not present

## 2022-01-18 DIAGNOSIS — F32A Depression, unspecified: Secondary | ICD-10-CM | POA: Diagnosis not present

## 2022-01-18 DIAGNOSIS — E1165 Type 2 diabetes mellitus with hyperglycemia: Secondary | ICD-10-CM | POA: Diagnosis not present

## 2022-01-18 DIAGNOSIS — I10 Essential (primary) hypertension: Secondary | ICD-10-CM | POA: Diagnosis not present

## 2022-01-18 DIAGNOSIS — E785 Hyperlipidemia, unspecified: Secondary | ICD-10-CM | POA: Diagnosis not present

## 2022-01-18 DIAGNOSIS — K219 Gastro-esophageal reflux disease without esophagitis: Secondary | ICD-10-CM | POA: Diagnosis not present

## 2022-01-19 ENCOUNTER — Other Ambulatory Visit: Payer: Self-pay

## 2022-01-19 MED ORDER — ACCU-CHEK GUIDE ME W/DEVICE KIT: PACK | 0 refills | Status: DC

## 2022-01-23 ENCOUNTER — Telehealth: Payer: Self-pay | Admitting: Family Medicine

## 2022-01-23 DIAGNOSIS — E785 Hyperlipidemia, unspecified: Secondary | ICD-10-CM | POA: Diagnosis not present

## 2022-01-23 DIAGNOSIS — Z48811 Encounter for surgical aftercare following surgery on the nervous system: Secondary | ICD-10-CM | POA: Diagnosis not present

## 2022-01-23 DIAGNOSIS — F32A Depression, unspecified: Secondary | ICD-10-CM | POA: Diagnosis not present

## 2022-01-23 DIAGNOSIS — K219 Gastro-esophageal reflux disease without esophagitis: Secondary | ICD-10-CM | POA: Diagnosis not present

## 2022-01-23 DIAGNOSIS — K5900 Constipation, unspecified: Secondary | ICD-10-CM | POA: Diagnosis not present

## 2022-01-23 DIAGNOSIS — E1165 Type 2 diabetes mellitus with hyperglycemia: Secondary | ICD-10-CM | POA: Diagnosis not present

## 2022-01-23 DIAGNOSIS — E1142 Type 2 diabetes mellitus with diabetic polyneuropathy: Secondary | ICD-10-CM | POA: Diagnosis not present

## 2022-01-23 DIAGNOSIS — I10 Essential (primary) hypertension: Secondary | ICD-10-CM | POA: Diagnosis not present

## 2022-01-23 NOTE — Telephone Encounter (Signed)
I think this is an Pharmacist, hospital. Patient follows with pain management.  ?

## 2022-01-23 NOTE — Telephone Encounter (Signed)
Sherice from [facility] called regarding patient's pain levels, from leg pain. States pt is saying their pain is 9 out of 10 and the pain is interrupting his sleep x 3 days. Pt is running low on oxycodone and patient's bp is 140/90. Pt was present during this call and I asked the aid to ask the patient did they want to make an appointment and the patient declined making an appointment and stated they just wanted this info passed to the provider ?

## 2022-01-25 ENCOUNTER — Telehealth: Payer: Self-pay | Admitting: Family Medicine

## 2022-01-25 DIAGNOSIS — E1165 Type 2 diabetes mellitus with hyperglycemia: Secondary | ICD-10-CM | POA: Diagnosis not present

## 2022-01-25 DIAGNOSIS — E1142 Type 2 diabetes mellitus with diabetic polyneuropathy: Secondary | ICD-10-CM | POA: Diagnosis not present

## 2022-01-25 DIAGNOSIS — Z48811 Encounter for surgical aftercare following surgery on the nervous system: Secondary | ICD-10-CM | POA: Diagnosis not present

## 2022-01-25 DIAGNOSIS — K5900 Constipation, unspecified: Secondary | ICD-10-CM | POA: Diagnosis not present

## 2022-01-25 DIAGNOSIS — E785 Hyperlipidemia, unspecified: Secondary | ICD-10-CM | POA: Diagnosis not present

## 2022-01-25 DIAGNOSIS — K219 Gastro-esophageal reflux disease without esophagitis: Secondary | ICD-10-CM | POA: Diagnosis not present

## 2022-01-25 DIAGNOSIS — F32A Depression, unspecified: Secondary | ICD-10-CM | POA: Diagnosis not present

## 2022-01-25 DIAGNOSIS — I10 Essential (primary) hypertension: Secondary | ICD-10-CM | POA: Diagnosis not present

## 2022-01-25 NOTE — Telephone Encounter (Signed)
[facility] is calling and needs VO extended for PT 2x3 and pt pain is better he received his pain med ?

## 2022-01-26 NOTE — Telephone Encounter (Signed)
VO given.

## 2022-01-30 DIAGNOSIS — I10 Essential (primary) hypertension: Secondary | ICD-10-CM | POA: Diagnosis not present

## 2022-01-30 DIAGNOSIS — K5900 Constipation, unspecified: Secondary | ICD-10-CM | POA: Diagnosis not present

## 2022-01-30 DIAGNOSIS — E1165 Type 2 diabetes mellitus with hyperglycemia: Secondary | ICD-10-CM | POA: Diagnosis not present

## 2022-01-30 DIAGNOSIS — K219 Gastro-esophageal reflux disease without esophagitis: Secondary | ICD-10-CM | POA: Diagnosis not present

## 2022-01-30 DIAGNOSIS — E1142 Type 2 diabetes mellitus with diabetic polyneuropathy: Secondary | ICD-10-CM | POA: Diagnosis not present

## 2022-01-30 DIAGNOSIS — E785 Hyperlipidemia, unspecified: Secondary | ICD-10-CM | POA: Diagnosis not present

## 2022-01-30 DIAGNOSIS — Z48811 Encounter for surgical aftercare following surgery on the nervous system: Secondary | ICD-10-CM | POA: Diagnosis not present

## 2022-01-30 DIAGNOSIS — F32A Depression, unspecified: Secondary | ICD-10-CM | POA: Diagnosis not present

## 2022-01-31 ENCOUNTER — Ambulatory Visit: Payer: Medicare Other | Admitting: Physical Medicine and Rehabilitation

## 2022-02-01 DIAGNOSIS — I10 Essential (primary) hypertension: Secondary | ICD-10-CM | POA: Diagnosis not present

## 2022-02-01 DIAGNOSIS — E1142 Type 2 diabetes mellitus with diabetic polyneuropathy: Secondary | ICD-10-CM | POA: Diagnosis not present

## 2022-02-01 DIAGNOSIS — Z48811 Encounter for surgical aftercare following surgery on the nervous system: Secondary | ICD-10-CM | POA: Diagnosis not present

## 2022-02-01 DIAGNOSIS — E785 Hyperlipidemia, unspecified: Secondary | ICD-10-CM | POA: Diagnosis not present

## 2022-02-01 DIAGNOSIS — E1165 Type 2 diabetes mellitus with hyperglycemia: Secondary | ICD-10-CM | POA: Diagnosis not present

## 2022-02-01 DIAGNOSIS — K219 Gastro-esophageal reflux disease without esophagitis: Secondary | ICD-10-CM | POA: Diagnosis not present

## 2022-02-01 DIAGNOSIS — K5900 Constipation, unspecified: Secondary | ICD-10-CM | POA: Diagnosis not present

## 2022-02-01 DIAGNOSIS — F32A Depression, unspecified: Secondary | ICD-10-CM | POA: Diagnosis not present

## 2022-02-06 DIAGNOSIS — I10 Essential (primary) hypertension: Secondary | ICD-10-CM | POA: Diagnosis not present

## 2022-02-06 DIAGNOSIS — E785 Hyperlipidemia, unspecified: Secondary | ICD-10-CM | POA: Diagnosis not present

## 2022-02-06 DIAGNOSIS — K5900 Constipation, unspecified: Secondary | ICD-10-CM | POA: Diagnosis not present

## 2022-02-06 DIAGNOSIS — Z48811 Encounter for surgical aftercare following surgery on the nervous system: Secondary | ICD-10-CM | POA: Diagnosis not present

## 2022-02-06 DIAGNOSIS — K219 Gastro-esophageal reflux disease without esophagitis: Secondary | ICD-10-CM | POA: Diagnosis not present

## 2022-02-06 DIAGNOSIS — F32A Depression, unspecified: Secondary | ICD-10-CM | POA: Diagnosis not present

## 2022-02-06 DIAGNOSIS — E1165 Type 2 diabetes mellitus with hyperglycemia: Secondary | ICD-10-CM | POA: Diagnosis not present

## 2022-02-06 DIAGNOSIS — E1142 Type 2 diabetes mellitus with diabetic polyneuropathy: Secondary | ICD-10-CM | POA: Diagnosis not present

## 2022-02-08 DIAGNOSIS — E785 Hyperlipidemia, unspecified: Secondary | ICD-10-CM | POA: Diagnosis not present

## 2022-02-08 DIAGNOSIS — E1142 Type 2 diabetes mellitus with diabetic polyneuropathy: Secondary | ICD-10-CM | POA: Diagnosis not present

## 2022-02-08 DIAGNOSIS — Z48811 Encounter for surgical aftercare following surgery on the nervous system: Secondary | ICD-10-CM | POA: Diagnosis not present

## 2022-02-08 DIAGNOSIS — F32A Depression, unspecified: Secondary | ICD-10-CM | POA: Diagnosis not present

## 2022-02-08 DIAGNOSIS — I10 Essential (primary) hypertension: Secondary | ICD-10-CM | POA: Diagnosis not present

## 2022-02-08 DIAGNOSIS — K5900 Constipation, unspecified: Secondary | ICD-10-CM | POA: Diagnosis not present

## 2022-02-08 DIAGNOSIS — K219 Gastro-esophageal reflux disease without esophagitis: Secondary | ICD-10-CM | POA: Diagnosis not present

## 2022-02-08 DIAGNOSIS — E1165 Type 2 diabetes mellitus with hyperglycemia: Secondary | ICD-10-CM | POA: Diagnosis not present

## 2022-02-13 DIAGNOSIS — E1165 Type 2 diabetes mellitus with hyperglycemia: Secondary | ICD-10-CM | POA: Diagnosis not present

## 2022-02-13 DIAGNOSIS — K5900 Constipation, unspecified: Secondary | ICD-10-CM | POA: Diagnosis not present

## 2022-02-13 DIAGNOSIS — K219 Gastro-esophageal reflux disease without esophagitis: Secondary | ICD-10-CM | POA: Diagnosis not present

## 2022-02-13 DIAGNOSIS — F32A Depression, unspecified: Secondary | ICD-10-CM | POA: Diagnosis not present

## 2022-02-13 DIAGNOSIS — M4714 Other spondylosis with myelopathy, thoracic region: Secondary | ICD-10-CM | POA: Diagnosis not present

## 2022-02-13 DIAGNOSIS — Z48811 Encounter for surgical aftercare following surgery on the nervous system: Secondary | ICD-10-CM | POA: Diagnosis not present

## 2022-02-13 DIAGNOSIS — I10 Essential (primary) hypertension: Secondary | ICD-10-CM | POA: Diagnosis not present

## 2022-02-13 DIAGNOSIS — E785 Hyperlipidemia, unspecified: Secondary | ICD-10-CM | POA: Diagnosis not present

## 2022-02-13 DIAGNOSIS — E1142 Type 2 diabetes mellitus with diabetic polyneuropathy: Secondary | ICD-10-CM | POA: Diagnosis not present

## 2022-02-15 ENCOUNTER — Telehealth: Payer: Self-pay | Admitting: Family Medicine

## 2022-02-15 DIAGNOSIS — F32A Depression, unspecified: Secondary | ICD-10-CM | POA: Diagnosis not present

## 2022-02-15 DIAGNOSIS — E1142 Type 2 diabetes mellitus with diabetic polyneuropathy: Secondary | ICD-10-CM | POA: Diagnosis not present

## 2022-02-15 DIAGNOSIS — E1165 Type 2 diabetes mellitus with hyperglycemia: Secondary | ICD-10-CM | POA: Diagnosis not present

## 2022-02-15 DIAGNOSIS — Z48811 Encounter for surgical aftercare following surgery on the nervous system: Secondary | ICD-10-CM | POA: Diagnosis not present

## 2022-02-15 DIAGNOSIS — K219 Gastro-esophageal reflux disease without esophagitis: Secondary | ICD-10-CM | POA: Diagnosis not present

## 2022-02-15 DIAGNOSIS — E785 Hyperlipidemia, unspecified: Secondary | ICD-10-CM | POA: Diagnosis not present

## 2022-02-15 DIAGNOSIS — I10 Essential (primary) hypertension: Secondary | ICD-10-CM | POA: Diagnosis not present

## 2022-02-15 DIAGNOSIS — K5900 Constipation, unspecified: Secondary | ICD-10-CM | POA: Diagnosis not present

## 2022-02-15 NOTE — Telephone Encounter (Signed)
[facility] is calling and need VO 2x2 starting on 02-19-2022 ?

## 2022-02-16 NOTE — Telephone Encounter (Signed)
VO given to [employer]. ?

## 2022-02-19 DIAGNOSIS — E1142 Type 2 diabetes mellitus with diabetic polyneuropathy: Secondary | ICD-10-CM | POA: Diagnosis not present

## 2022-02-19 DIAGNOSIS — Z48811 Encounter for surgical aftercare following surgery on the nervous system: Secondary | ICD-10-CM | POA: Diagnosis not present

## 2022-02-19 DIAGNOSIS — K219 Gastro-esophageal reflux disease without esophagitis: Secondary | ICD-10-CM | POA: Diagnosis not present

## 2022-02-19 DIAGNOSIS — E1165 Type 2 diabetes mellitus with hyperglycemia: Secondary | ICD-10-CM | POA: Diagnosis not present

## 2022-02-19 DIAGNOSIS — E785 Hyperlipidemia, unspecified: Secondary | ICD-10-CM | POA: Diagnosis not present

## 2022-02-19 DIAGNOSIS — I10 Essential (primary) hypertension: Secondary | ICD-10-CM | POA: Diagnosis not present

## 2022-02-19 DIAGNOSIS — F32A Depression, unspecified: Secondary | ICD-10-CM | POA: Diagnosis not present

## 2022-02-19 DIAGNOSIS — K5900 Constipation, unspecified: Secondary | ICD-10-CM | POA: Diagnosis not present

## 2022-02-22 DIAGNOSIS — K5900 Constipation, unspecified: Secondary | ICD-10-CM | POA: Diagnosis not present

## 2022-02-22 DIAGNOSIS — E785 Hyperlipidemia, unspecified: Secondary | ICD-10-CM | POA: Diagnosis not present

## 2022-02-22 DIAGNOSIS — K219 Gastro-esophageal reflux disease without esophagitis: Secondary | ICD-10-CM | POA: Diagnosis not present

## 2022-02-22 DIAGNOSIS — I10 Essential (primary) hypertension: Secondary | ICD-10-CM | POA: Diagnosis not present

## 2022-02-22 DIAGNOSIS — E1165 Type 2 diabetes mellitus with hyperglycemia: Secondary | ICD-10-CM | POA: Diagnosis not present

## 2022-02-22 DIAGNOSIS — Z48811 Encounter for surgical aftercare following surgery on the nervous system: Secondary | ICD-10-CM | POA: Diagnosis not present

## 2022-02-22 DIAGNOSIS — E1142 Type 2 diabetes mellitus with diabetic polyneuropathy: Secondary | ICD-10-CM | POA: Diagnosis not present

## 2022-02-22 DIAGNOSIS — F32A Depression, unspecified: Secondary | ICD-10-CM | POA: Diagnosis not present

## 2022-02-27 ENCOUNTER — Telehealth: Payer: Self-pay | Admitting: Pharmacist

## 2022-02-27 DIAGNOSIS — K5900 Constipation, unspecified: Secondary | ICD-10-CM | POA: Diagnosis not present

## 2022-02-27 DIAGNOSIS — F32A Depression, unspecified: Secondary | ICD-10-CM | POA: Diagnosis not present

## 2022-02-27 DIAGNOSIS — E785 Hyperlipidemia, unspecified: Secondary | ICD-10-CM | POA: Diagnosis not present

## 2022-02-27 DIAGNOSIS — E1142 Type 2 diabetes mellitus with diabetic polyneuropathy: Secondary | ICD-10-CM | POA: Diagnosis not present

## 2022-02-27 DIAGNOSIS — K219 Gastro-esophageal reflux disease without esophagitis: Secondary | ICD-10-CM | POA: Diagnosis not present

## 2022-02-27 DIAGNOSIS — Z48811 Encounter for surgical aftercare following surgery on the nervous system: Secondary | ICD-10-CM | POA: Diagnosis not present

## 2022-02-27 DIAGNOSIS — I10 Essential (primary) hypertension: Secondary | ICD-10-CM | POA: Diagnosis not present

## 2022-02-27 DIAGNOSIS — E1165 Type 2 diabetes mellitus with hyperglycemia: Secondary | ICD-10-CM | POA: Diagnosis not present

## 2022-02-27 NOTE — Telephone Encounter (Signed)
Please advise patient to continue monitoring BP regularly, if persistently under 100/60, we will need to decrease dose of antihypertensive medication. Thanks, BJ

## 2022-02-27 NOTE — Chronic Care Management (AMB) (Signed)
Received call from patient, he reports medication error on his end. He states he has for the past 3 days taken 2 capsules instead of 1 for his duloxetine. He reports his blood pressure on today was 92/70 is feeling weak, dizzy, dry mouth and nauseated with some constipation. Reports he will not take dose for today or tomorrow wanted to know if that is appropriate or if he should be seen and wants PCP to be aware of the mistake. Advised I would forward to the office and Pharmacist and report back to him. He Is  in agreement.   Dora Clinical Pharmacist Assistant [phone]

## 2022-02-27 NOTE — Telephone Encounter (Signed)
Patient took 120 mg of duloxetine x 3 days by accident, now feeling nauseated, weak, dizzy and has dry mouth. BP was 92/70.  120 mg of duloxetine is within the normal range of doses, I would not expect duloxetine to cause these symptoms. It is reasonable to skip his dose today and resume normal dosing (60 mg/day) tomorrow.   Attempted to reach patient to discuss, unable to reach and left voicemail: if symptoms do not improve he may need an OV to evaluate symptoms.

## 2022-02-28 ENCOUNTER — Inpatient Hospital Stay (HOSPITAL_COMMUNITY)
Admission: EM | Admit: 2022-02-28 | Discharge: 2022-03-06 | DRG: 175 | Disposition: A | Discharge status: Home / Self Care | Payer: Medicare Other | Attending: Family Medicine | Admitting: Family Medicine

## 2022-02-28 ENCOUNTER — Emergency Department (HOSPITAL_COMMUNITY): Payer: Medicare Other

## 2022-02-28 ENCOUNTER — Encounter (HOSPITAL_COMMUNITY): Payer: Self-pay | Admitting: Emergency Medicine

## 2022-02-28 DIAGNOSIS — N179 Acute kidney failure, unspecified: Secondary | ICD-10-CM | POA: Diagnosis present

## 2022-02-28 DIAGNOSIS — Z79899 Other long term (current) drug therapy: Secondary | ICD-10-CM

## 2022-02-28 DIAGNOSIS — I5031 Acute diastolic (congestive) heart failure: Secondary | ICD-10-CM | POA: Diagnosis not present

## 2022-02-28 DIAGNOSIS — N1831 Chronic kidney disease, stage 3a: Secondary | ICD-10-CM | POA: Diagnosis present

## 2022-02-28 DIAGNOSIS — I361 Nonrheumatic tricuspid (valve) insufficiency: Secondary | ICD-10-CM | POA: Diagnosis not present

## 2022-02-28 DIAGNOSIS — I10 Essential (primary) hypertension: Secondary | ICD-10-CM | POA: Diagnosis present

## 2022-02-28 DIAGNOSIS — Z823 Family history of stroke: Secondary | ICD-10-CM

## 2022-02-28 DIAGNOSIS — I2699 Other pulmonary embolism without acute cor pulmonale: Principal | ICD-10-CM | POA: Diagnosis present

## 2022-02-28 DIAGNOSIS — Z8249 Family history of ischemic heart disease and other diseases of the circulatory system: Secondary | ICD-10-CM

## 2022-02-28 DIAGNOSIS — R748 Abnormal levels of other serum enzymes: Secondary | ICD-10-CM | POA: Diagnosis not present

## 2022-02-28 DIAGNOSIS — R079 Chest pain, unspecified: Secondary | ICD-10-CM | POA: Diagnosis not present

## 2022-02-28 DIAGNOSIS — R072 Precordial pain: Secondary | ICD-10-CM | POA: Diagnosis not present

## 2022-02-28 DIAGNOSIS — G8929 Other chronic pain: Secondary | ICD-10-CM | POA: Diagnosis present

## 2022-02-28 DIAGNOSIS — M549 Dorsalgia, unspecified: Secondary | ICD-10-CM | POA: Diagnosis present

## 2022-02-28 DIAGNOSIS — E1122 Type 2 diabetes mellitus with diabetic chronic kidney disease: Secondary | ICD-10-CM | POA: Diagnosis present

## 2022-02-28 DIAGNOSIS — M5030 Other cervical disc degeneration, unspecified cervical region: Secondary | ICD-10-CM | POA: Diagnosis present

## 2022-02-28 DIAGNOSIS — Z888 Allergy status to other drugs, medicaments and biological substances status: Secondary | ICD-10-CM

## 2022-02-28 DIAGNOSIS — Z86711 Personal history of pulmonary embolism: Secondary | ICD-10-CM

## 2022-02-28 DIAGNOSIS — N189 Chronic kidney disease, unspecified: Secondary | ICD-10-CM | POA: Diagnosis not present

## 2022-02-28 DIAGNOSIS — I130 Hypertensive heart and chronic kidney disease with heart failure and stage 1 through stage 4 chronic kidney disease, or unspecified chronic kidney disease: Secondary | ICD-10-CM | POA: Diagnosis not present

## 2022-02-28 DIAGNOSIS — E785 Hyperlipidemia, unspecified: Secondary | ICD-10-CM | POA: Diagnosis present

## 2022-02-28 DIAGNOSIS — Z6841 Body Mass Index (BMI) 40.0 and over, adult: Secondary | ICD-10-CM

## 2022-02-28 DIAGNOSIS — E876 Hypokalemia: Secondary | ICD-10-CM | POA: Diagnosis present

## 2022-02-28 DIAGNOSIS — I248 Other forms of acute ischemic heart disease: Secondary | ICD-10-CM | POA: Diagnosis not present

## 2022-02-28 DIAGNOSIS — K760 Fatty (change of) liver, not elsewhere classified: Secondary | ICD-10-CM | POA: Diagnosis present

## 2022-02-28 DIAGNOSIS — R0602 Shortness of breath: Secondary | ICD-10-CM | POA: Diagnosis present

## 2022-02-28 DIAGNOSIS — I509 Heart failure, unspecified: Secondary | ICD-10-CM

## 2022-02-28 DIAGNOSIS — R29898 Other symptoms and signs involving the musculoskeletal system: Secondary | ICD-10-CM | POA: Diagnosis not present

## 2022-02-28 DIAGNOSIS — I2724 Chronic thromboembolic pulmonary hypertension: Secondary | ICD-10-CM | POA: Diagnosis not present

## 2022-02-28 DIAGNOSIS — J9601 Acute respiratory failure with hypoxia: Secondary | ICD-10-CM | POA: Diagnosis not present

## 2022-02-28 DIAGNOSIS — E1140 Type 2 diabetes mellitus with diabetic neuropathy, unspecified: Secondary | ICD-10-CM | POA: Diagnosis present

## 2022-02-28 DIAGNOSIS — F32A Depression, unspecified: Secondary | ICD-10-CM | POA: Diagnosis not present

## 2022-02-28 DIAGNOSIS — Z66 Do not resuscitate: Secondary | ICD-10-CM | POA: Diagnosis present

## 2022-02-28 DIAGNOSIS — I50812 Chronic right heart failure: Secondary | ICD-10-CM | POA: Diagnosis present

## 2022-02-28 DIAGNOSIS — Z886 Allergy status to analgesic agent status: Secondary | ICD-10-CM

## 2022-02-28 DIAGNOSIS — R531 Weakness: Secondary | ICD-10-CM | POA: Diagnosis not present

## 2022-02-28 DIAGNOSIS — D3502 Benign neoplasm of left adrenal gland: Secondary | ICD-10-CM | POA: Diagnosis present

## 2022-02-28 DIAGNOSIS — E669 Obesity, unspecified: Secondary | ICD-10-CM | POA: Diagnosis present

## 2022-02-28 DIAGNOSIS — I82409 Acute embolism and thrombosis of unspecified deep veins of unspecified lower extremity: Secondary | ICD-10-CM | POA: Diagnosis not present

## 2022-02-28 DIAGNOSIS — N281 Cyst of kidney, acquired: Secondary | ICD-10-CM | POA: Diagnosis not present

## 2022-02-28 DIAGNOSIS — G40909 Epilepsy, unspecified, not intractable, without status epilepticus: Secondary | ICD-10-CM | POA: Diagnosis present

## 2022-02-28 DIAGNOSIS — Z825 Family history of asthma and other chronic lower respiratory diseases: Secondary | ICD-10-CM

## 2022-02-28 DIAGNOSIS — K649 Unspecified hemorrhoids: Secondary | ICD-10-CM | POA: Diagnosis present

## 2022-02-28 DIAGNOSIS — Z7901 Long term (current) use of anticoagulants: Secondary | ICD-10-CM

## 2022-02-28 DIAGNOSIS — Z0389 Encounter for observation for other suspected diseases and conditions ruled out: Secondary | ICD-10-CM | POA: Diagnosis not present

## 2022-02-28 DIAGNOSIS — M419 Scoliosis, unspecified: Secondary | ICD-10-CM | POA: Diagnosis present

## 2022-02-28 DIAGNOSIS — Z833 Family history of diabetes mellitus: Secondary | ICD-10-CM

## 2022-02-28 DIAGNOSIS — I340 Nonrheumatic mitral (valve) insufficiency: Secondary | ICD-10-CM | POA: Diagnosis not present

## 2022-02-28 DIAGNOSIS — Z87891 Personal history of nicotine dependence: Secondary | ICD-10-CM

## 2022-02-28 DIAGNOSIS — N183 Chronic kidney disease, stage 3 unspecified: Secondary | ICD-10-CM

## 2022-02-28 DIAGNOSIS — J9611 Chronic respiratory failure with hypoxia: Secondary | ICD-10-CM

## 2022-02-28 NOTE — ED Triage Notes (Signed)
Patient c/o weakness since last Thursday.  Patient also c/o "pain across his right hand all the way across his chest" since Thursday as well.  Patient also endorses shob since Thursday.

## 2022-02-28 NOTE — Telephone Encounter (Signed)
I left patient a voicemail letting him know to continue to monitor his BP regularly and to let us know if it continues to run low. I advised him to call back with any questions and/or concerns.

## 2022-03-01 ENCOUNTER — Inpatient Hospital Stay (HOSPITAL_COMMUNITY): Payer: Medicare Other

## 2022-03-01 ENCOUNTER — Other Ambulatory Visit: Payer: Self-pay

## 2022-03-01 DIAGNOSIS — R079 Chest pain, unspecified: Secondary | ICD-10-CM

## 2022-03-01 DIAGNOSIS — E876 Hypokalemia: Secondary | ICD-10-CM | POA: Diagnosis present

## 2022-03-01 DIAGNOSIS — N281 Cyst of kidney, acquired: Secondary | ICD-10-CM | POA: Diagnosis not present

## 2022-03-01 DIAGNOSIS — E669 Obesity, unspecified: Secondary | ICD-10-CM | POA: Diagnosis present

## 2022-03-01 DIAGNOSIS — Z0389 Encounter for observation for other suspected diseases and conditions ruled out: Secondary | ICD-10-CM | POA: Diagnosis not present

## 2022-03-01 DIAGNOSIS — I5031 Acute diastolic (congestive) heart failure: Secondary | ICD-10-CM | POA: Diagnosis present

## 2022-03-01 DIAGNOSIS — I2724 Chronic thromboembolic pulmonary hypertension: Secondary | ICD-10-CM | POA: Diagnosis present

## 2022-03-01 DIAGNOSIS — F32A Depression, unspecified: Secondary | ICD-10-CM | POA: Diagnosis present

## 2022-03-01 DIAGNOSIS — Z86711 Personal history of pulmonary embolism: Secondary | ICD-10-CM | POA: Diagnosis not present

## 2022-03-01 DIAGNOSIS — I50812 Chronic right heart failure: Secondary | ICD-10-CM | POA: Diagnosis present

## 2022-03-01 DIAGNOSIS — I2699 Other pulmonary embolism without acute cor pulmonale: Secondary | ICD-10-CM | POA: Diagnosis present

## 2022-03-01 DIAGNOSIS — R0602 Shortness of breath: Secondary | ICD-10-CM | POA: Diagnosis present

## 2022-03-01 DIAGNOSIS — I361 Nonrheumatic tricuspid (valve) insufficiency: Secondary | ICD-10-CM | POA: Diagnosis present

## 2022-03-01 DIAGNOSIS — E785 Hyperlipidemia, unspecified: Secondary | ICD-10-CM | POA: Diagnosis present

## 2022-03-01 DIAGNOSIS — N1831 Chronic kidney disease, stage 3a: Secondary | ICD-10-CM | POA: Diagnosis present

## 2022-03-01 DIAGNOSIS — J9601 Acute respiratory failure with hypoxia: Secondary | ICD-10-CM | POA: Diagnosis present

## 2022-03-01 DIAGNOSIS — K760 Fatty (change of) liver, not elsewhere classified: Secondary | ICD-10-CM | POA: Diagnosis present

## 2022-03-01 DIAGNOSIS — R29898 Other symptoms and signs involving the musculoskeletal system: Secondary | ICD-10-CM | POA: Diagnosis not present

## 2022-03-01 DIAGNOSIS — R531 Weakness: Secondary | ICD-10-CM | POA: Diagnosis present

## 2022-03-01 DIAGNOSIS — G40909 Epilepsy, unspecified, not intractable, without status epilepticus: Secondary | ICD-10-CM | POA: Diagnosis present

## 2022-03-01 DIAGNOSIS — E1140 Type 2 diabetes mellitus with diabetic neuropathy, unspecified: Secondary | ICD-10-CM | POA: Diagnosis present

## 2022-03-01 DIAGNOSIS — Z79899 Other long term (current) drug therapy: Secondary | ICD-10-CM | POA: Diagnosis not present

## 2022-03-01 DIAGNOSIS — R748 Abnormal levels of other serum enzymes: Secondary | ICD-10-CM

## 2022-03-01 DIAGNOSIS — I130 Hypertensive heart and chronic kidney disease with heart failure and stage 1 through stage 4 chronic kidney disease, or unspecified chronic kidney disease: Secondary | ICD-10-CM | POA: Diagnosis present

## 2022-03-01 DIAGNOSIS — Z6841 Body Mass Index (BMI) 40.0 and over, adult: Secondary | ICD-10-CM | POA: Diagnosis not present

## 2022-03-01 DIAGNOSIS — Z66 Do not resuscitate: Secondary | ICD-10-CM | POA: Diagnosis present

## 2022-03-01 DIAGNOSIS — I248 Other forms of acute ischemic heart disease: Secondary | ICD-10-CM | POA: Diagnosis present

## 2022-03-01 DIAGNOSIS — N179 Acute kidney failure, unspecified: Secondary | ICD-10-CM | POA: Diagnosis present

## 2022-03-01 DIAGNOSIS — E1122 Type 2 diabetes mellitus with diabetic chronic kidney disease: Secondary | ICD-10-CM | POA: Diagnosis present

## 2022-03-01 DIAGNOSIS — I82409 Acute embolism and thrombosis of unspecified deep veins of unspecified lower extremity: Secondary | ICD-10-CM | POA: Diagnosis not present

## 2022-03-01 LAB — COMPREHENSIVE METABOLIC PANEL
ALT: 85 U/L — ABNORMAL HIGH (ref 0–44)
ALT: 78 U/L — ABNORMAL HIGH (ref 0–44)
AST: 87 U/L — ABNORMAL HIGH (ref 15–41)
AST: 61 U/L — ABNORMAL HIGH (ref 15–41)
Albumin: 3.8 g/dL (ref 3.5–5.0)
Albumin: 3.7 g/dL (ref 3.5–5.0)
Alkaline Phosphatase: 130 U/L — ABNORMAL HIGH (ref 38–126)
Alkaline Phosphatase: 109 U/L (ref 38–126)
Anion gap: 18 — ABNORMAL HIGH (ref 5–15)
Anion gap: 12 (ref 5–15)
BUN: 50 mg/dL — ABNORMAL HIGH (ref 8–23)
BUN: 53 mg/dL — ABNORMAL HIGH (ref 8–23)
CO2: 22 mmol/L (ref 22–32)
CO2: 28 mmol/L (ref 22–32)
Calcium: 9.3 mg/dL (ref 8.9–10.3)
Calcium: 9.2 mg/dL (ref 8.9–10.3)
Chloride: 101 mmol/L (ref 98–111)
Chloride: 100 mmol/L (ref 98–111)
Creatinine, Ser: 3.34 mg/dL — ABNORMAL HIGH (ref 0.61–1.24)
Creatinine, Ser: 3.29 mg/dL — ABNORMAL HIGH (ref 0.61–1.24)
GFR, Estimated: 19 mL/min — ABNORMAL LOW (ref >60)
GFR, Estimated: 20 mL/min — ABNORMAL LOW (ref >60)
Glucose, Bld: 168 mg/dL — ABNORMAL HIGH (ref 70–99)
Glucose, Bld: 142 mg/dL — ABNORMAL HIGH (ref 70–99)
Potassium: 4 mmol/L (ref 3.5–5.1)
Potassium: 4 mmol/L (ref 3.5–5.1)
Sodium: 141 mmol/L (ref 135–145)
Sodium: 140 mmol/L (ref 135–145)
Total Bilirubin: 1.5 mg/dL — ABNORMAL HIGH (ref 0.3–1.2)
Total Bilirubin: 1.2 mg/dL (ref 0.3–1.2)
Total Protein: 7.9 g/dL (ref 6.5–8.1)
Total Protein: 7.7 g/dL (ref 6.5–8.1)

## 2022-03-01 LAB — ECHOCARDIOGRAM COMPLETE
AV Peak grad: 2.6 mmHg
Ao pk vel: 0.81 m/s
Area-P 1/2: 3.46 cm2
Height: 72 in
S' Lateral: 2.4 cm
Weight: 4867.76 oz

## 2022-03-01 LAB — URINALYSIS, ROUTINE W REFLEX MICROSCOPIC
Bilirubin Urine: NEGATIVE
Glucose, UA: NEGATIVE mg/dL
Ketones, ur: NEGATIVE mg/dL
Nitrite: NEGATIVE
Protein, ur: NEGATIVE mg/dL
Specific Gravity, Urine: 1.012 (ref 1.005–1.030)
pH: 5 (ref 5.0–8.0)

## 2022-03-01 LAB — CBC WITH DIFFERENTIAL/PLATELET
Abs Immature Granulocytes: 0.05 10*3/uL (ref 0.00–0.07)
Basophils Absolute: 0 10*3/uL (ref 0.0–0.1)
Basophils Relative: 0 %
Eosinophils Absolute: 0.1 10*3/uL (ref 0.0–0.5)
Eosinophils Relative: 1 %
HCT: 38.3 % — ABNORMAL LOW (ref 39.0–52.0)
Hemoglobin: 12.3 g/dL — ABNORMAL LOW (ref 13.0–17.0)
Immature Granulocytes: 1 %
Lymphocytes Relative: 20 %
Lymphs Abs: 1.7 10*3/uL (ref 0.7–4.0)
MCH: 31.4 pg (ref 26.0–34.0)
MCHC: 32.1 g/dL (ref 30.0–36.0)
MCV: 97.7 fL (ref 80.0–100.0)
Monocytes Absolute: 1.1 10*3/uL — ABNORMAL HIGH (ref 0.1–1.0)
Monocytes Relative: 13 %
Neutro Abs: 5.5 10*3/uL (ref 1.7–7.7)
Neutrophils Relative %: 65 %
Platelets: 281 10*3/uL (ref 150–400)
RBC: 3.92 MIL/uL — ABNORMAL LOW (ref 4.22–5.81)
RDW: 17.3 % — ABNORMAL HIGH (ref 11.5–15.5)
WBC: 8.5 10*3/uL (ref 4.0–10.5)
nRBC: 2 % — ABNORMAL HIGH (ref 0.0–0.2)

## 2022-03-01 LAB — HEPARIN LEVEL (UNFRACTIONATED)
Heparin Unfractionated: 0.39 IU/mL (ref 0.30–0.70)
Heparin Unfractionated: 0.41 IU/mL (ref 0.30–0.70)

## 2022-03-01 LAB — BRAIN NATRIURETIC PEPTIDE: B Natriuretic Peptide: 1128.1 pg/mL — ABNORMAL HIGH (ref 0.0–100.0)

## 2022-03-01 LAB — TROPONIN I (HIGH SENSITIVITY)
Troponin I (High Sensitivity): 106 ng/L (ref <18)
Troponin I (High Sensitivity): 201 ng/L (ref <18)
Troponin I (High Sensitivity): 85 ng/L — ABNORMAL HIGH (ref <18)

## 2022-03-01 LAB — MRSA NEXT GEN BY PCR, NASAL: MRSA by PCR Next Gen: NOT DETECTED

## 2022-03-01 LAB — HIV ANTIBODY (ROUTINE TESTING W REFLEX): HIV Screen 4th Generation wRfx: NONREACTIVE

## 2022-03-01 LAB — CREATININE, URINE, RANDOM: Creatinine, Urine: 78.29 mg/dL

## 2022-03-01 LAB — SODIUM, URINE, RANDOM: Sodium, Ur: 80 mmol/L

## 2022-03-01 MED ORDER — ADULT MULTIVITAMIN W/MINERALS CH
1.0000 | ORAL_TABLET | Freq: Every day | ORAL | Status: DC
  Administered 2022-03-01 – 2022-03-06 (×6): 1 via ORAL
  Filled 2022-03-01 (×6): qty 1

## 2022-03-01 MED ORDER — FUROSEMIDE 10 MG/ML IJ SOLN
40.0000 mg | Freq: Once | INTRAMUSCULAR | Status: AC
  Administered 2022-03-01: 40 mg via INTRAVENOUS
  Filled 2022-03-01: qty 4

## 2022-03-01 MED ORDER — TECHNETIUM TO 99M ALBUMIN AGGREGATED
4.3000 | Freq: Once | INTRAVENOUS | Status: AC | PRN
  Administered 2022-03-01: 4.3 via INTRAVENOUS

## 2022-03-01 MED ORDER — DULOXETINE HCL 60 MG PO CPEP
60.0000 mg | ORAL_CAPSULE | Freq: Every day | ORAL | Status: DC
  Administered 2022-03-01 – 2022-03-06 (×6): 60 mg via ORAL
  Filled 2022-03-01 (×6): qty 1

## 2022-03-01 MED ORDER — CLONIDINE HCL 0.1 MG PO TABS
0.1000 mg | ORAL_TABLET | Freq: Every day | ORAL | Status: DC
Start: 2022-03-01 — End: 2022-03-06
  Administered 2022-03-01 – 2022-03-06 (×6): 0.1 mg via ORAL
  Filled 2022-03-01 (×6): qty 1

## 2022-03-01 MED ORDER — LEVETIRACETAM 250 MG PO TABS
250.0000 mg | ORAL_TABLET | Freq: Two times a day (BID) | ORAL | Status: DC
Start: 2022-03-01 — End: 2022-03-06
  Administered 2022-03-01 – 2022-03-06 (×11): 250 mg via ORAL
  Filled 2022-03-01 (×14): qty 1

## 2022-03-01 MED ORDER — HEPARIN (PORCINE) 25000 UT/250ML-% IV SOLN
1400.0000 [IU]/h | INTRAVENOUS | Status: DC
  Administered 2022-03-01 (×2): 1400 [IU]/h via INTRAVENOUS
  Filled 2022-03-01 (×2): qty 250

## 2022-03-01 MED ORDER — GABAPENTIN 300 MG PO CAPS
300.0000 mg | ORAL_CAPSULE | Freq: Two times a day (BID) | ORAL | Status: DC
  Administered 2022-03-01 – 2022-03-06 (×11): 300 mg via ORAL
  Filled 2022-03-01 (×11): qty 1

## 2022-03-01 MED ORDER — HEPARIN BOLUS VIA INFUSION
4000.0000 [IU] | Freq: Once | INTRAVENOUS | Status: AC
  Administered 2022-03-01: 4000 [IU] via INTRAVENOUS
  Filled 2022-03-01: qty 4000

## 2022-03-01 MED ORDER — GABAPENTIN 600 MG PO TABS
300.0000 mg | ORAL_TABLET | Freq: Two times a day (BID) | ORAL | Status: DC
Start: 2022-03-01 — End: 2022-03-01
  Filled 2022-03-01 (×2): qty 0.5

## 2022-03-01 MED ORDER — OXYCODONE-ACETAMINOPHEN 7.5-325 MG PO TABS
2.0000 | ORAL_TABLET | Freq: Four times a day (QID) | ORAL | Status: DC | PRN
Start: 2022-03-01 — End: 2022-03-03
  Administered 2022-03-01 – 2022-03-03 (×6): 2 via ORAL
  Filled 2022-03-01 (×6): qty 2

## 2022-03-01 MED ORDER — POLYETHYLENE GLYCOL 3350 17 G PO PACK: 17.0000 g | PACK | Freq: Every day | ORAL | Status: DC | PRN

## 2022-03-01 MED ORDER — HYDROMORPHONE HCL 1 MG/ML IJ SOLN
1.0000 mg | Freq: Once | INTRAMUSCULAR | Status: AC
  Administered 2022-03-01: 1 mg via INTRAVENOUS
  Filled 2022-03-01: qty 1

## 2022-03-01 MED ORDER — ORAL CARE MOUTH RINSE
15.0000 mL | Freq: Two times a day (BID) | OROMUCOSAL | Status: DC
  Administered 2022-03-01 – 2022-03-05 (×9): 15 mL via OROMUCOSAL

## 2022-03-01 MED ORDER — ACETAMINOPHEN 325 MG PO TABS: 325.0000 mg | ORAL_TABLET | ORAL | Status: DC | PRN

## 2022-03-01 NOTE — Consult Note (Signed)
Referring Physician: Betty [name], MD/Jason Mesner, MD  Keith [name] is an 71 y.o. male.                       Chief Complaint: Chest pain and shortness of breath  HPI: 72 years old black male with PMH of HTN, Type 2 DM, morbid obesity, CKD II, Pulmonary embolus, DVT and multiple back surgeries has chest pain and shortness of breath x 1 week. He has 06/27/2020 echocardiogram showing normal LV systolic function and moderate RV systolic dysfunction and dilatation. CXR  yesterday showed stable cardiomegaly with marked tortuosity of the descending thoracic aorta without acute cardiopulmonary disease. EKG shows Sinus rhythm and anterior and inferior infarct with RVH. Troponin I is elevated at 106 ng/L. CBC is near normal except mild anemia. BNP is elevated at 1128.1 pg.  Past Medical History:  Diagnosis Date   Allergy    Chronic back pain    Diabetes mellitus without complication ([place])    DNR (do not resuscitate) 11/19/2020   Duodenal ulcer hemorrhage 08/29/2014   ED (erectile dysfunction)    Esophageal stricture 08/30/2014   Glucose intolerance (impaired glucose tolerance)    Hiatal hernia 08/30/2014   Hypertension    Hypokalemia 04/11/2013   MRSA carrier 08/30/2014   Obesity    Osteoarthritis    Pneumonia    Pulmonary embolism ([place]) 06/2020   Scoliosis 2016   Spinal stenosis of lumbar region    Urinary tract infection 04/11/2013      Past Surgical History:  Procedure Laterality Date   BACK SURGERY  08/07/2021   COLONOSCOPY  2008   ESOPHAGOGASTRODUODENOSCOPY N/A 08/29/2014   Procedure: ESOPHAGOGASTRODUODENOSCOPY (EGD);  Surgeon: Lafayette Dragon, MD;  Location: [facility];  Service: Endoscopy;  Laterality: N/A;   LAMINECTOMY N/A 07/28/2018   Procedure: THORACIC ELEVEN- THORACIC TWELVE POSTERIOR DECOMPRESSION LAMINECTOMY;  Surgeon: Judith Part, MD;  Location: [facility];  Service: Neurosurgery;  Laterality: N/A;   LUMBAR LAMINECTOMY     NASAL POLYP SURGERY     ORIF ANKLE  FRACTURE Right 07/01/2020   Procedure: OPEN REDUCTION INTERNAL FIXATION (ORIF) ANKLE FRACTURE. TRIMALLEOLAR;  Surgeon: Marchia Bond, MD;  Location: [facility];  Service: Orthopedics;  Laterality: Right;   TONSILLECTOMY      Family History  Problem Relation Age of Onset   Diabetes Mother    Asthma Mother    Hypertension Father    Stroke Father    Colon cancer Neg Hx    Social History:  reports that he quit smoking about 28 years ago. His smoking use included cigarettes. He has a 25.00 pack-year smoking history. He has never used smokeless tobacco. He reports that he does not currently use alcohol. He reports current drug use. Drug: Oxycodone.  Allergies:  Allergies  Allergen Reactions   Aspirin Other (See Comments)    Irritates the stomach and the patient developed ulcers, also   Lisinopril Other (See Comments)    Caused a body ache    (Not in a hospital admission)   Results for orders placed or performed during the hospital encounter of 02/28/22 (from the past 48 hour(s))  Troponin I (High Sensitivity)     Status: Abnormal   Collection Time: 02/28/22 11:44 PM  Result Value Ref Range   Troponin I (High Sensitivity) 85 (H) <18 ng/L    Comment: (NOTE) Elevated high sensitivity troponin I (hsTnI) values and significant  changes across serial measurements may suggest ACS but many other  chronic  and acute conditions are known to elevate hsTnI results.  Refer to the "Links" section for chest pain algorithms and additional  guidance. Performed at [facility], [place] [address], [place], [place] [address]   Brain natriuretic peptide     Status: Abnormal   Collection Time: 02/28/22 11:44 PM  Result Value Ref Range   B Natriuretic Peptide 1,128.1 (H) 0.0 - 100.0 pg/mL    Comment: Performed at [facility] [address], [place], [place] [address]  Comprehensive metabolic panel     Status: Abnormal   Collection Time: 02/28/22 11:44 PM  Result Value Ref Range   Sodium 141  135 - 145 mmol/L   Potassium 4.0 3.5 - 5.1 mmol/L   Chloride 101 98 - 111 mmol/L   CO2 22 22 - 32 mmol/L   Glucose, Bld 168 (H) 70 - 99 mg/dL    Comment: Glucose reference range applies only to samples taken after fasting for at least 8 hours.   BUN 50 (H) 8 - 23 mg/dL   Creatinine, Ser 3.34 (H) 0.61 - 1.24 mg/dL   Calcium 9.3 8.9 - 10.3 mg/dL   Total Protein 7.9 6.5 - 8.1 g/dL   Albumin 3.8 3.5 - 5.0 g/dL   AST 87 (H) 15 - 41 U/L   ALT 85 (H) 0 - 44 U/L   Alkaline Phosphatase 130 (H) 38 - 126 U/L   Total Bilirubin 1.5 (H) 0.3 - 1.2 mg/dL   GFR, Estimated 19 (L) >60 mL/min    Comment: (NOTE) Calculated using the CKD-EPI Creatinine Equation (2021)    Anion gap 18 (H) 5 - 15    Comment: Performed at [facility], [place] [address] [place], [place] [address]  CBC with Differential     Status: Abnormal   Collection Time: 02/28/22 11:44 PM  Result Value Ref Range   WBC 8.5 4.0 - 10.5 K/uL   RBC 3.92 (L) 4.22 - 5.81 MIL/uL   Hemoglobin 12.3 (L) 13.0 - 17.0 g/dL   HCT 38.3 (L) 39.0 - 52.0 %   MCV 97.7 80.0 - 100.0 fL   MCH 31.4 26.0 - 34.0 pg   MCHC 32.1 30.0 - 36.0 g/dL   RDW 17.3 (H) 11.5 - 15.5 %   Platelets 281 150 - 400 K/uL   nRBC 2.0 (H) 0.0 - 0.2 %   Neutrophils Relative % 65 %   Neutro Abs 5.5 1.7 - 7.7 K/uL   Lymphocytes Relative 20 %   Lymphs Abs 1.7 0.7 - 4.0 K/uL   Monocytes Relative 13 %   Monocytes Absolute 1.1 (H) 0.1 - 1.0 K/uL   Eosinophils Relative 1 %   Eosinophils Absolute 0.1 0.0 - 0.5 K/uL   Basophils Relative 0 %   Basophils Absolute 0.0 0.0 - 0.1 K/uL   Immature Granulocytes 1 %   Abs Immature Granulocytes 0.05 0.00 - 0.07 K/uL    Comment: Performed at [facility], [place] [address], [place], [place] [address]  Troponin I (High Sensitivity)     Status: Abnormal   Collection Time: 03/01/22  1:37 AM  Result Value Ref Range   Troponin I (High Sensitivity) 106 (HH) <18 ng/L    Comment: CRITICAL RESULT CALLED TO, READ BACK BY AND VERIFIED  WITH:  Demetra Shiner,   RN, 0340,03/01/22, E. ADEDOKUN (NOTE) Elevated high sensitivity troponin I (hsTnI) values and significant  changes across serial measurements may suggest ACS but many other  chronic and acute  conditions are known to elevate hsTnI results.  Refer to the Links section for chest pain algorithms and additional  guidance. Performed at [facility], [place] [address] [place], [place] [address]    DG Chest 2 View  Result Date: 02/28/2022 CLINICAL DATA:  Chest pain and weakness. EXAM: CHEST - 2 VIEW COMPARISON:  September 19, 2021 FINDINGS: There is stable mild to moderate severity enlargement of the cardiac silhouette. There is marked severity tortuosity of the descending thoracic aorta. Both lungs are clear. Thin, curvilinear wire like material is seen overlying the lateral aspect of the mid left chest wall on the frontal view. There is mild to moderate severity dextroscoliosis of the mid to lower thoracic spine with multilevel degenerative changes. Postoperative changes are noted within the visualized portion of the upper lumbar spine. IMPRESSION: 1. Stable cardiomegaly with marked severity tortuosity of the descending thoracic aorta. 2. No acute or active cardiopulmonary disease. Electronically Signed   By: Virgina Norfolk M.D.   On: 02/28/2022 23:56    Review Of Systems Constitutional: No fever, chills, Chronic weight gain. Eyes: No vision change, wears glasses. No discharge or pain. Ears: No hearing loss, No tinnitus. Respiratory: No asthma, COPD, pneumonias. Positive shortness of breath. No hemoptysis. Cardiovascular: Positive chest pain, palpitation, leg edema. Gastrointestinal: No nausea, vomiting, diarrhea, constipation. No GI bleed. No hepatitis. Genitourinary: No dysuria, hematuria, kidney stone. No incontinance. Neurological: No headache, stroke, seizures.  Psychiatry: No psych facility admission for anxiety, depression, suicide. No detox. Skin: No  rash. Musculoskeletal: No joint pain, fibromyalgia. Positive neck pain, back pain. Lymphadenopathy: No lymphadenopathy. Hematology: No anemia or easy bruising.   Blood pressure 116/73, pulse 67, temperature 97.7 F (36.5 C), temperature source Oral, resp. rate 16, height 6' (1.829 m), weight 127 kg, SpO2 99 %. Body mass index is 37.97 kg/m. General appearance: alert, cooperative, appears stated age and moderate distress Head: Normocephalic, atraumatic. Eyes: Brown eyes, pink conjunctiva, corneas clear.  Neck: No adenopathy, no carotid bruit, no JVD, supple, symmetrical, trachea midline and thyroid not enlarged. Resp: Clearing to auscultation bilaterally. Cardio: Regular rate and rhythm, S1, S2 normal, II/VI systolic murmur, no click, rub or gallop GI: Soft, non-tender; bowel sounds normal; no organomegaly. Extremities: 2 + edema, no cyanosis or clubbing. Skin: Warm and dry.  Neurologic: Alert and oriented X 3, bilateral lower extremities weakness.  Assessment/Plan Acute coronary syndrome Acute diastolic left heart failure Abnormal troponin I levels from demand ischemia Chronic PE Pulmonary HTN HTN HLD Type 2 DM Acute renal failure on CKD Obesity, BMI of 35-40 kg/m2.  Plan:  IV heparin. Patient is at high risk for cardiac interventions. Echocardiogram for LV and RV function. Renal consult.  Time spent: Review of old records, Lab, x-rays, EKG, other cardiac tests, examination, discussion with patient/ER doctor over 70 minutes.  Birdie Riddle, MD  03/01/2022, 3:45 AM

## 2022-03-01 NOTE — TOC Initial Note (Signed)
Transition of Care [facility]) - Initial/Assessment Note    Patient Details  Name: Keith [name] MRN: [id number] Date of Birth: November 28, 1950  Transition of [facility]) CM/SW Contact:    Angelita Ingles, RN Phone Number:[phone]  03/01/2022, 4:03 PM  Clinical Narrative:                 71 y.o. male with medical history significant of hypertension, type 2 diabetes, hyperlipidemia, spinal stenosis/scoliosis/chronic back pain, PE, seizure disorder presented to the ED with right-sided chest pain and shortness of breath.Transition of Care Department [facility]) has reviewed patient and no TOC needs have been identified at this time. We will continue to monitor patient advancement through interdisciplinary progression rounds. If new patient transition needs arise, please place a TOC consult.         Patient Goals and CMS Choice        Expected Discharge Plan and Services                                                Prior Living Arrangements/Services                       Activities of Daily Living Home Assistive Devices/Equipment: Eyeglasses, Gilford Rile (specify type) ADL Screening (condition at time of admission) Patient's cognitive ability adequate to safely complete daily activities?: Yes Is the patient deaf or have difficulty hearing?: No Does the patient have difficulty seeing, even when wearing glasses/contacts?: No Does the patient have difficulty concentrating, remembering, or making decisions?: No Patient able to express need for assistance with ADLs?: Yes Does the patient have difficulty dressing or bathing?: Yes Independently performs ADLs?: Yes (appropriate for developmental age) Does the patient have difficulty walking or climbing stairs?: Yes Weakness of Legs: Both Weakness of Arms/Hands: None  Permission Sought/Granted                  Emotional Assessment              Admission diagnosis:  SOB (shortness of breath) [R06.02] Acute renal  failure superimposed on chronic kidney disease, unspecified CKD stage, unspecified acute renal failure type ([place]) [N17.9, N18.9] Heart failure, unspecified HF chronicity, unspecified heart failure type ([place]) [I50.9] Patient Active Problem List   Diagnosis Date Noted   Chest pain 03/01/2022   Elevated liver enzymes 03/01/2022   Myofascial pain 10/30/2021   Controlled type 2 diabetes mellitus with hyperglycemia, without long-term current use of insulin ([place])    Bilateral leg weakness 08/08/2021   CAP (community acquired pneumonia) 11/19/2020   Loculated pleural effusion 11/19/2020   DNR (do not resuscitate) 11/19/2020   Closed right ankle fracture    Labile blood glucose    Hypokalemia    Drug induced constipation    Debility    Hyponatremia    Diabetic peripheral neuropathy ([place])    Acute blood loss anemia    Chronic pain syndrome    Postoperative pain    Closed right trimalleolar fracture 07/02/2020   Acute respiratory failure with hypoxia ([place])    Lumbar spinal stenosis 06/01/2020   Spinal cord compression ([place]) 04/02/2020   Thoracic myelopathy 07/25/2018   Hyperlipidemia associated with type 2 diabetes mellitus ([place]) 11/14/2016   Type 2 diabetes mellitus with diabetic neuropathy, unspecified ([place]) 11/14/2016   Routine general medical examination at a health care facility  11/14/2015   Hiatal hernia 08/30/2014   Acute renal failure superimposed on stage 3a chronic kidney disease ([place]) 04/10/2013   Allergic rhinitis 04/10/2013   Spinal stenosis of lumbar region 11/06/2012   Allergic rhinitis due to pollen 01/15/2011   Osteoarthritis 11/02/2008   HIP PAIN, LEFT 02/09/2008   Class 2 obesity due to excess calories with body mass index (BMI) of 36.0 to 36.9 in adult 11/07/2007   ERECTILE DYSFUNCTION, MILD 11/07/2007   Essential hypertension 11/07/2007   PCP:  [name], [name], MD Pharmacy:   [id number] [place] [address] [place] [address]Phone: [phone]: [phone]    Social Determinants of Health (SDOH) Interventions    Readmission Risk Interventions     View : No data to display.

## 2022-03-01 NOTE — Progress Notes (Signed)
Seen and examined after midnight  78 home dwelling black male Known saddle pulmonary embolism/eetvt [id number] complicated by significant hypotension needed Levophed--also fell and had malleoli or ankle fracture in her requiring ORIF and was discharged to CIR--- stopped anticoagulation apparently 11/2020 Prior pleural effusion 11/2020 secondary to pneumonia?  Exudative Left adrenal adenoma DM TY 2 with neuropathy +/- hypoglycemia Class II obesity HTN Chronic pain secondary to cervicalgia on opiates + depression  Office communications from 5/23 note patient was missed taking his duloxetine-taking 2 capsules instead of 1-developed blood pressures in the 90s, dizziness dry mouth nausea nation constipation-then subsequently apparently developed some pain across his chest-with radiation and SOB-worse with movement exercise exertion--?  Pleuritic component  Upto ~ 7 days ago, he could walk ~ 100 ft without any difficulty  2/2 complaints came to emergency room-troponin 85-->106/BNP 1128-EKG no STEMI Also BUNs/creatinine baseline 30/1.7-->50/3.3 Patient intolerant aspirin not given Patient started IV heparin Dr. Doylene Canard cardiology consulted-recommended echocardiogram CXR = cardiomegaly with severe marked tortuosity of the descending aorta US ABD =?  Fact fatty echotexture to liver?  Simple cyst right kidney  On exam BP 110/72 (BP Location: Left Arm)   Pulse 66   Temp 97.7 F (36.5 C) (Oral)   Resp 19   Ht 6' (1.829 m)   Wt (!) 138 kg   SpO2 96%   BMI 41.26 kg/m   Coherent alet in nad no focal deficit Mallampati 4, poor dentition NSR on monitors s1 s2 no m/r/g Abd obese nt nd cannot appreciate HSM   P Echo + V/Q pending--unclear what's going on at this time Appreciate cardiology input He probably has OSA--I have asked him to trial night time CPAP We will decide on heparin when results are back  We will need further discussions with him about lifestyle changes and  diet   No charge  Verneita Griffes, MD [facility] 1:44 PM

## 2022-03-01 NOTE — Progress Notes (Signed)
2D echo attempted, but patient going for procedure. Will try later

## 2022-03-01 NOTE — Progress Notes (Addendum)
ANTICOAGULATION CONSULT NOTE   Pharmacy Consult for heparin Indication: chest pain/ACS vs PE  Allergies  Allergen Reactions   Aspirin Other (See Comments)    Irritates the stomach and the patient developed ulcers, also   Lisinopril Other (See Comments)    Caused a body ache    Patient Measurements: Height: 6' (182.9 cm) Weight: (!) 138 kg (304 lb 3.8 oz) IBW/kg (Calculated) : 77.6 Heparin Dosing Weight: 105kg  Vital Signs: Temp: 97.7 F (36.5 C) (05/25 0804) Temp Source: Oral (05/25 0804) BP: 110/72 (05/25 0804) Pulse Rate: 66 (05/25 0804)  Labs: Recent Labs    02/28/22 2344 03/01/22 0137 03/01/22 0711 03/01/22 1152  HGB 12.3*  --   --   --   HCT 38.3*  --   --   --   PLT 281  --   --   --   HEPARINUNFRC  --   --   --  0.39  CREATININE 3.34*  --  3.29*  --   TROPONINIHS 85* 106* 201*  --      Estimated Creatinine Clearance: 30.5 mL/min (A) (by C-G formula based on SCr of 3.29 mg/dL (H)).  Assessment: 71yo male on heparin for ?NSTEMI and r/o PE. Heparin level therapeutic (0.39) on infusion at 1400 units/hr. No bleeding noted.  Goal of Therapy:  Heparin level 0.3-0.7 units/ml Monitor platelets by anticoagulation protocol: Yes   Plan:  Heparin 1400 units/hr  Will f/u 6h confirmatory heparin level F/u VQ scan  Sherlon Handing, PharmD, BCPS Please see amion for complete clinical pharmacist phone list 03/01/2022,12:38 PM

## 2022-03-01 NOTE — H&P (Signed)
History and Physical    Keith [name] DOB: 01/30/1951 DOA: 02/28/2022  PCP: [name], [name], MD  Patient coming from: Home  Chief Complaint: Chest pain  HPI: Keith [name] is a 71 y.o. male with medical history significant of hypertension, type 2 diabetes, hyperlipidemia, spinal stenosis/scoliosis/chronic back pain, PE, seizure disorder presented to the ED with right-sided chest pain and shortness of breath.  Hypoxic to the upper 80s and placed on 2 L supplemental oxygen.  Labs showing WBC 8.5, hemoglobin 12.3 (stable), platelet count 281k.  Sodium 141, potassium 4.0, chloride 101, bicarb 22, BUN 50, creatinine 3.3 (baseline 1.3-1.5), glucose 168.  AST 87, ALT 85, alk phos 130, T. bili 1.5.  EKG showing no STEMI.  High-sensitivity troponin 85 >106.  BNP 1128.  Chest x-ray showing no acute cardiopulmonary disease. Aspirin not given due to history of intolerance.  Cardiology consulted and patient started on IV heparin.  Patient reports 5-day history of progressively worsening dyspnea.  He feels short of breath even at rest.  Reports orthopnea.  Denies fevers or cough.  Reports bilateral lower extremity edema since after he had back surgery in November.  For the past 5 days he is also having nonexertional chest pain.  He describes it as pain all across his chest and radiating down his right arm.  States he had a blood clot 2 years ago in the setting of back surgery and was on anticoagulation for 6 months.  Also reports decreased urine output for the past 3 to 5 days.  Denies vomiting or diarrhea.  He is drinking fluids.  Review of Systems:  Review of Systems  All other systems reviewed and are negative.  Past Medical History:  Diagnosis Date   Allergy    Chronic back pain    Diabetes mellitus without complication ([place])    DNR (do not resuscitate) 11/19/2020   Duodenal ulcer hemorrhage 08/29/2014   ED (erectile dysfunction)    Esophageal stricture 08/30/2014   Glucose  intolerance (impaired glucose tolerance)    Hiatal hernia 08/30/2014   Hypertension    Hypokalemia 04/11/2013   MRSA carrier 08/30/2014   Obesity    Osteoarthritis    Pneumonia    Pulmonary embolism ([place]) 06/2020   Scoliosis 2016   Spinal stenosis of lumbar region    Urinary tract infection 04/11/2013    Past Surgical History:  Procedure Laterality Date   BACK SURGERY  08/07/2021   COLONOSCOPY  2008   ESOPHAGOGASTRODUODENOSCOPY N/A 08/29/2014   Procedure: ESOPHAGOGASTRODUODENOSCOPY (EGD);  Surgeon: Lafayette Dragon, MD;  Location: [facility];  Service: Endoscopy;  Laterality: N/A;   LAMINECTOMY N/A 07/28/2018   Procedure: THORACIC ELEVEN- THORACIC TWELVE POSTERIOR DECOMPRESSION LAMINECTOMY;  Surgeon: Judith Part, MD;  Location: [facility];  Service: Neurosurgery;  Laterality: N/A;   LUMBAR LAMINECTOMY     NASAL POLYP SURGERY     ORIF ANKLE FRACTURE Right 07/01/2020   Procedure: OPEN REDUCTION INTERNAL FIXATION (ORIF) ANKLE FRACTURE. TRIMALLEOLAR;  Surgeon: Marchia Bond, MD;  Location: [facility];  Service: Orthopedics;  Laterality: Right;   TONSILLECTOMY       reports that he quit smoking about 28 years ago. His smoking use included cigarettes. He has a 25.00 pack-year smoking history. He has never used smokeless tobacco. He reports that he does not currently use alcohol. He reports current drug use. Drug: Oxycodone.  Allergies  Allergen Reactions   Aspirin Other (See Comments)    Irritates the stomach and the patient developed ulcers,  also   Lisinopril Other (See Comments)    Caused a body ache    Family History  Problem Relation Age of Onset   Diabetes Mother    Asthma Mother    Hypertension Father    Stroke Father    Colon cancer Neg Hx     Prior to Admission medications   Medication Sig Start Date End Date Taking? Authorizing Provider  acetaminophen (TYLENOL) 325 MG tablet Take 1-2 tablets (325-650 mg total) by mouth every 4 (four) hours as needed for mild pain.  09/06/21  Yes Love, Ivan Anchors, PA-C  atenolol-chlorthalidone (TENORETIC) 50-25 MG tablet TAKE 1 TABLET BY MOUTH EVERY DAY Patient taking differently: Take 1 tablet by mouth daily. 09/15/21  Yes [name], [name], MD  cloNIDine (CATAPRES) 0.1 MG tablet TAKE 1 TABLET (0.1 MG TOTAL) BY MOUTH DAILY. 09/15/21  Yes [name], [name], MD  DULoxetine (CYMBALTA) 60 MG capsule TAKE 1 CAPSULE BY MOUTH EVERY DAY Patient taking differently: Take 60 mg by mouth daily. 11/24/21  Yes Lovorn, Jinny Blossom, MD  gabapentin (NEURONTIN) 600 MG tablet TAKE 1 TABLET BY MOUTH THREE TIMES A DAY Patient taking differently: Take 600 mg by mouth 3 (three) times daily. 10/23/21  Yes Lovorn, Jinny Blossom, MD  levETIRAcetam (KEPPRA) 250 MG tablet TAKE 1 TABLET BY MOUTH TWICE A DAY Patient taking differently: Take 250 mg by mouth 2 (two) times daily. 11/24/21  Yes Lovorn, Jinny Blossom, MD  Multiple Vitamin (MULITIVITAMIN WITH MINERALS) TABS Take 1 tablet by mouth daily with breakfast.   Yes [provider]  naloxone (NARCAN) nasal spray 4 mg/0.1 mL 1 spray as needed (accidental overdose). 09/06/21  Yes [provider]  oxyCODONE-acetaminophen (PERCOCET) 7.5-325 MG tablet Take 2 tablets by mouth every 6 (six) hours as needed for severe pain. Note that 120 pills were filled on 10/31--day prior to hospital admission. Taking this four times a day should be equal to using 10 mg three times a day as in the hospital. Can wean down as able. Patient taking differently: Take 2 tablets by mouth every 6 (six) hours as needed for severe pain. 09/06/21  Yes Love, Ivan Anchors, PA-C  polyethylene glycol (MIRALAX / GLYCOLAX) 17 g packet Take 17 g by mouth daily as needed for mild constipation. 09/06/21  Yes Love, Ivan Anchors, PA-C  Potassium 99 MG TABS Take 99 mg by mouth daily.   Yes [provider]  simvastatin (ZOCOR) 40 MG tablet TAKE 1 TABLET BY MOUTH EVERY DAY Patient taking differently: Take 40 mg by mouth daily. 09/15/21  Yes [name], [name], MD   Blood Glucose Monitoring Suppl (ACCU-CHEK GUIDE ME) w/Device KIT Use to test blood sugars 1-2 times daily. 01/19/22   [name], [name], MD  apixaban (ELIQUIS) 5 MG TABS tablet Take 1 tablet (5 mg total) by mouth 2 (two) times daily. Patient not taking: No sig reported 07/19/20 11/24/20  Cathlyn Parsons, PA-C    Physical Exam: Vitals:   03/01/22 0230 03/01/22 0300 03/01/22 0317 03/01/22 0424  BP: 106/78 116/73  120/79  Pulse: 66 67  78  Resp: '17 16  20  ' Temp:    97.8 F (36.6 C)  TempSrc:    Oral  SpO2: 98%  99% 99%  Weight:    (!) 138 kg  Height:    6' (1.829 m)    Physical Exam Vitals reviewed.  Constitutional:      General: He is not in acute distress. HENT:     Head: Normocephalic and atraumatic.  Eyes:     Extraocular Movements: Extraocular movements intact.  Cardiovascular:     Rate and Rhythm: Normal rate and regular rhythm.     Pulses: Normal pulses.  Pulmonary:     Effort: Pulmonary effort is normal. No respiratory distress.     Breath sounds: Normal breath sounds. No wheezing or rales.  Abdominal:     General: Bowel sounds are normal.     Palpations: Abdomen is soft.     Tenderness: There is no abdominal tenderness. There is no guarding.  Musculoskeletal:     Cervical back: Normal range of motion.     Right lower leg: Edema present.     Left lower leg: Edema present.     Comments: 2+ pitting edema of bilateral lower extremities  Skin:    General: Skin is warm and dry.  Neurological:     General: No focal deficit present.     Mental Status: He is alert and oriented to person, place, and time.     Labs on Admission: I have personally reviewed following labs and imaging studies  CBC: Recent Labs  Lab 02/28/22 2344  WBC 8.5  NEUTROABS 5.5  HGB 12.3*  HCT 38.3*  MCV 97.7  PLT 465   Basic Metabolic Panel: Recent Labs  Lab 02/28/22 2344  NA 141  K 4.0  CL 101  CO2 22  GLUCOSE 168*  BUN 50*  CREATININE 3.34*  CALCIUM 9.3   GFR: Estimated  Creatinine Clearance: 30.1 mL/min (A) (by C-G formula based on SCr of 3.34 mg/dL (H)). Liver Function Tests: Recent Labs  Lab 02/28/22 2344  AST 87*  ALT 85*  ALKPHOS 130*  BILITOT 1.5*  PROT 7.9  ALBUMIN 3.8   No results for input(s): LIPASE, AMYLASE in the last 168 hours. No results for input(s): AMMONIA in the last 168 hours. Coagulation Profile: No results for input(s): INR, PROTIME in the last 168 hours. Cardiac Enzymes: No results for input(s): CKTOTAL, CKMB, CKMBINDEX, TROPONINI in the last 168 hours. BNP (last 3 results) No results for input(s): PROBNP in the last 8760 hours. HbA1C: No results for input(s): HGBA1C in the last 72 hours. CBG: No results for input(s): GLUCAP in the last 168 hours. Lipid Profile: No results for input(s): CHOL, HDL, LDLCALC, TRIG, CHOLHDL, LDLDIRECT in the last 72 hours. Thyroid Function Tests: No results for input(s): TSH, T4TOTAL, FREET4, T3FREE, THYROIDAB in the last 72 hours. Anemia Panel: No results for input(s): VITAMINB12, FOLATE, FERRITIN, TIBC, IRON, RETICCTPCT in the last 72 hours. Urine analysis:    Component Value Date/Time   COLORURINE YELLOW 01/19/2019 1422   APPEARANCEUR HAZY (A) 01/19/2019 1422   LABSPEC 1.020 01/19/2019 1422   PHURINE 5.0 01/19/2019 1422   GLUCOSEU NEGATIVE 01/19/2019 1422   HGBUR MODERATE (A) 01/19/2019 1422   HGBUR negative 10/26/2010 0824   BILIRUBINUR NEGATIVE 01/19/2019 1422   BILIRUBINUR n 01/12/2019 1020   KETONESUR NEGATIVE 01/19/2019 1422   PROTEINUR 100 (A) 01/19/2019 1422   UROBILINOGEN 0.2 01/12/2019 1020   UROBILINOGEN 0.2 04/10/2013 1555   NITRITE NEGATIVE 01/19/2019 1422   LEUKOCYTESUR NEGATIVE 01/19/2019 1422    Radiological Exams on Admission: I have personally reviewed images DG Chest 2 View  Result Date: 02/28/2022 CLINICAL DATA:  Chest pain and weakness. EXAM: CHEST - 2 VIEW COMPARISON:  September 19, 2021 FINDINGS: There is stable mild to moderate severity enlargement of the  cardiac silhouette. There is marked severity tortuosity of the descending thoracic aorta. Both lungs are clear. Thin,  curvilinear wire like material is seen overlying the lateral aspect of the mid left chest wall on the frontal view. There is mild to moderate severity dextroscoliosis of the mid to lower thoracic spine with multilevel degenerative changes. Postoperative changes are noted within the visualized portion of the upper lumbar spine. IMPRESSION: 1. Stable cardiomegaly with marked severity tortuosity of the descending thoracic aorta. 2. No acute or active cardiopulmonary disease. Electronically Signed   By: Virgina Norfolk M.D.   On: 02/28/2022 23:56    EKG: Independently reviewed.  Sinus rhythm with first-degree AV block, no STEMI.  Assessment and Plan  Chest pain Acute hypoxic respiratory failure ?NSTEMI vs PE. Hypoxic to the upper 80s on room air and currently requiring 2 L supplemental oxygen.  EKG showing no STEMI.  High-sensitivity troponin 85 >106.  BNP 1128.  Chest x-ray showing no acute cardiopulmonary disease. Echo done in September 2021 showing EF 60 to 69%, grade 1 diastolic dysfunction, moderately enlarged RV size, moderately elevated pulmonary artery systolic pressure. -Cardiac monitoring -Cardiology consulted -Aspirin not given due to history of intolerance. -Continue IV heparin -Trend troponin -Echocardiogram -CTA cannot be done at this time to rule out PE given AKI/low GFR.  VQ scan ordered.  AKI on CKD stage IIIa BUN 50, creatinine 3.3 (baseline 1.3-1.5).  ?Cardiorenal given elevated BNP.  No poor oral intake, vomiting, or diarrhea reported. -Renal ultrasound -Urinalysis -Urine sodium and creatinine -Avoid nephrotoxic agents/contrast -Monitor renal function and urine output -Consult nephrology in the morning.  Elevated liver enzymes ?Heart failure given elevated BNP. -Ultrasound -Monitor LFTs  Hypertension Stable. -Continue clonidine  Diet controlled  type 2 diabetes A1c 6.6 on 01/17/2022.  Chronic back pain -Continue gabapentin, Percocet prn  Seizure disorder -Continue Keppra  DVT prophylaxis: IV heparin gtt Code Status: DNR/DNI per patient's wishes. Family Communication: Patient's wife at bedside but sleeping. Consults called: Cardiology Level of care: Progressive Care Unit Admission status: It is my clinical opinion that admission to INPATIENT is reasonable and necessary because of the expectation that this patient will require hospital care that crosses at least 2 midnights to treat this condition based on the medical complexity of the problems presented.  Given the aforementioned information, the predictability of an adverse outcome is felt to be significant.   Shela Leff MD [facility]  If 7PM-7AM, please contact night-coverage [email or web address]  03/01/2022, 4:49 AM

## 2022-03-01 NOTE — ED Provider Notes (Signed)
[facility] EMERGENCY DEPARTMENT Provider Note   CSN: [id number] Arrival date & time: 02/28/22  2329     History  Chief Complaint  Patient presents with   Chest Pain   Weakness    DVONTE GATLIFF is a 71 y.o. male.  71 yo m with multiple medical problems to include hypertension, diabetes, PE on Eliquis, seizures and multiple back surgeries that is currently going to therapy to try to increase his strength.  He presents to the ER today with 4 to 5 days of right-sided pain.  Patient states he has a pain that goes from his right arm across his right side of his chest associated with significant short of breath.  All these are worse with movement, exercise and any real exertion.  Gets pretty much gone with rest.  Sometimes he will have some pleuritic component to it but not consistently.  No fever or productive cough.  No falls no rashes.  No other associated symptoms.  Is not ever had that this before.  He has had a pulmonary embolus which is the main thing he is worried about.   Chest Pain Associated symptoms: weakness   Weakness Associated symptoms: chest pain       Home Medications Prior to Admission medications   Medication Sig Start Date End Date Taking? Authorizing Provider  acetaminophen (TYLENOL) 325 MG tablet Take 1-2 tablets (325-650 mg total) by mouth every 4 (four) hours as needed for mild pain. 09/06/21   Love, Ivan Anchors, PA-C  atenolol-chlorthalidone (TENORETIC) 50-25 MG tablet TAKE 1 TABLET BY MOUTH EVERY DAY Patient taking differently: Take 1 tablet by mouth daily. 09/15/21   [name], [name], MD  Blood Glucose Monitoring Suppl (ACCU-CHEK GUIDE ME) w/Device KIT Use to test blood sugars 1-2 times daily. 01/19/22   [name], [name], MD  cloNIDine (CATAPRES) 0.1 MG tablet TAKE 1 TABLET (0.1 MG TOTAL) BY MOUTH DAILY. 09/15/21   [name], [name], MD  DULoxetine (CYMBALTA) 60 MG capsule TAKE 1 CAPSULE BY MOUTH EVERY DAY Patient taking differently: Take 60 mg by  mouth daily. 11/24/21   Lovorn, Jinny Blossom, MD  gabapentin (NEURONTIN) 600 MG tablet TAKE 1 TABLET BY MOUTH THREE TIMES A DAY Patient taking differently: Take 600 mg by mouth 3 (three) times daily. 10/23/21   Lovorn, Jinny Blossom, MD  levETIRAcetam (KEPPRA) 250 MG tablet TAKE 1 TABLET BY MOUTH TWICE A DAY Patient taking differently: Take 250 mg by mouth 2 (two) times daily. 11/24/21   Lovorn, Jinny Blossom, MD  Multiple Vitamin (MULITIVITAMIN WITH MINERALS) TABS Take 1 tablet by mouth daily with breakfast.    [provider]  naloxone (NARCAN) nasal spray 4 mg/0.1 mL 1 spray as needed. 09/06/21   [provider]  oxyCODONE-acetaminophen (PERCOCET) 7.5-325 MG tablet Take 2 tablets by mouth every 6 (six) hours as needed for severe pain. Note that 120 pills were filled on 10/31--day prior to hospital admission. Taking this four times a day should be equal to using 10 mg three times a day as in the hospital. Can wean down as able. 09/06/21   Love, Ivan Anchors, PA-C  polyethylene glycol (MIRALAX / GLYCOLAX) 17 g packet Take 17 g by mouth daily as needed for mild constipation. 09/06/21   Love, Ivan Anchors, PA-C  Potassium 99 MG TABS Take 1 tablet by mouth daily.    [provider]  simvastatin (ZOCOR) 40 MG tablet TAKE 1 TABLET BY MOUTH EVERY DAY Patient taking differently: Take 40 mg by mouth  daily. 09/15/21   [name], [name], MD  apixaban (ELIQUIS) 5 MG TABS tablet Take 1 tablet (5 mg total) by mouth 2 (two) times daily. Patient not taking: No sig reported 07/19/20 11/24/20  Angiulli, Lavon Paganini, PA-C      Allergies    Aspirin and Lisinopril    Review of Systems   Review of Systems  Cardiovascular:  Positive for chest pain.  Neurological:  Positive for weakness.   Physical Exam Updated Vital Signs BP 116/73   Pulse 67   Temp 97.7 F (36.5 C) (Oral)   Resp 16   Ht 6' (1.829 m)   Wt 127 kg   SpO2 99%   BMI 37.97 kg/m  Physical Exam Vitals and nursing note reviewed.  Constitutional:       Appearance: He is well-developed.  HENT:     Head: Normocephalic and atraumatic.  Cardiovascular:     Rate and Rhythm: Normal rate.  Pulmonary:     Effort: Pulmonary effort is normal. No respiratory distress.  Chest:     Chest wall: No mass or tenderness.  Abdominal:     General: There is no distension or abdominal bruit.     Palpations: Abdomen is soft.  Musculoskeletal:        General: Normal range of motion.     Cervical back: Normal range of motion.  Skin:    General: Skin is warm and dry.     Findings: No rash.  Neurological:     Mental Status: He is alert.    ED Results / Procedures / Treatments   Labs (all labs ordered are listed, but only abnormal results are displayed) Labs Reviewed  BRAIN NATRIURETIC PEPTIDE - Abnormal; Notable for the following components:      Result Value   B Natriuretic Peptide 1,128.1 (*)    All other components within normal limits  COMPREHENSIVE METABOLIC PANEL - Abnormal; Notable for the following components:   Glucose, Bld 168 (*)    BUN 50 (*)    Creatinine, Ser 3.34 (*)    AST 87 (*)    ALT 85 (*)    Alkaline Phosphatase 130 (*)    Total Bilirubin 1.5 (*)    GFR, Estimated 19 (*)    Anion gap 18 (*)    All other components within normal limits  CBC WITH DIFFERENTIAL/PLATELET - Abnormal; Notable for the following components:   RBC 3.92 (*)    Hemoglobin 12.3 (*)    HCT 38.3 (*)    RDW 17.3 (*)    nRBC 2.0 (*)    Monocytes Absolute 1.1 (*)    All other components within normal limits  TROPONIN I (HIGH SENSITIVITY) - Abnormal; Notable for the following components:   Troponin I (High Sensitivity) 85 (*)    All other components within normal limits  HEPARIN LEVEL (UNFRACTIONATED)  TROPONIN I (HIGH SENSITIVITY)    EKG EKG Interpretation  Date/Time:  Wednesday Feb 28 2022 23:36:03 EDT Ventricular Rate:  76 PR Interval:  202 QRS Duration: 94 QT Interval:  414 QTC Calculation: 465 R Axis:   253 Text Interpretation: Normal  sinus rhythm Right ventricular hypertrophy Inferior infarct , age undetermined Anterolateral infarct , age undetermined Abnormal ECG When compared with ECG of 07-Aug-2021 13:22, PREVIOUS ECG IS PRESENT Confirmed by Merrily Pew [phone]) on 02/28/2022 11:51:02 PM  Radiology DG Chest 2 View  Result Date: 02/28/2022 CLINICAL DATA:  Chest pain and weakness. EXAM: CHEST - 2 VIEW COMPARISON:  September 19, 2021 FINDINGS: There is stable mild to moderate severity enlargement of the cardiac silhouette. There is marked severity tortuosity of the descending thoracic aorta. Both lungs are clear. Thin, curvilinear wire like material is seen overlying the lateral aspect of the mid left chest wall on the frontal view. There is mild to moderate severity dextroscoliosis of the mid to lower thoracic spine with multilevel degenerative changes. Postoperative changes are noted within the visualized portion of the upper lumbar spine. IMPRESSION: 1. Stable cardiomegaly with marked severity tortuosity of the descending thoracic aorta. 2. No acute or active cardiopulmonary disease. Electronically Signed   By: Virgina Norfolk M.D.   On: 02/28/2022 23:56    Procedures .Critical Care Performed by: Merrily Pew, MD Authorized by: Merrily Pew, MD   Critical care provider statement:    Critical care time (minutes):  30   Critical care was necessary to treat or prevent imminent or life-threatening deterioration of the following conditions:  Cardiac failure   Critical care was time spent personally by me on the following activities:  Development of treatment plan with patient or surrogate, discussions with consultants, evaluation of patient's response to treatment, examination of patient, ordering and review of laboratory studies, ordering and review of radiographic studies, ordering and performing treatments and interventions, pulse oximetry, re-evaluation of patient's condition and review of old charts    Medications Ordered  in ED Medications  heparin ADULT infusion 100 units/mL (25000 units/246m) (1,400 Units/hr Intravenous New Bag/Given 03/01/22 0309)  HYDROmorphone (DILAUDID) injection 1 mg (1 mg Intravenous Given 03/01/22 0032)  furosemide (LASIX) injection 40 mg (40 mg Intravenous Given 03/01/22 0302)  heparin bolus via infusion 4,000 Units (4,000 Units Intravenous Bolus from Bag 03/01/22 0309)    ED Course/ Medical Decision Making/ A&P                           Medical Decision Making Amount and/or Complexity of Data Reviewed Labs: ordered. Radiology: ordered.  Risk Prescription drug management. Decision regarding hospitalization.  Difficult to ascertain exactly what is causing the symptoms.  Will evaluate for cardiac versus pulmonary embolus versus muscular causes.  His x-ray interpreted by me is pretty reassuring some haziness in the right upper lobe but reviewing the radiology read does not seem to be acute.ECG also interpreted by me without any acute abnormalities or changes from October 2022. On review of labs my interpretation is that the Troponin elevated. Bnp elevated. And kidney function is significantly worsened.  Difficult to assess if he had a cardiac event last week causing the issue vs PE. Creatinine acutely elevated, not a candidate fro CT scan. D/w Dr. KDoylene Canard unassigned cardiology and will see. Heparin started. Doesn't tolerate ASA, Dr KDoylene Canarddoesn't want plavix yet in case patient needs cath/surgery.  D/w Dr. RMarlowe Saxfor admission.   Final Clinical Impression(s) / ED Diagnoses Final diagnoses:  Acute renal failure superimposed on chronic kidney disease, unspecified CKD stage, unspecified acute renal failure type ([place]  Heart failure, unspecified HF chronicity, unspecified heart failure type ([facility]    Rx / DC Orders ED Discharge Orders     None         Jevon Littlepage, JCorene Cornea MD 03/01/22 [phone]

## 2022-03-01 NOTE — Progress Notes (Signed)
[place] for heparin Indication: chest pain/ACS vs PE  Allergies  Allergen Reactions   Aspirin Other (See Comments)    Irritates the stomach and the patient developed ulcers, also   Lisinopril Other (See Comments)    Caused a body ache    Patient Measurements: Height: 6' (182.9 cm) Weight: (!) 138 kg (304 lb 3.8 oz) IBW/kg (Calculated) : 77.6 Heparin Dosing Weight: 105kg  Vital Signs: Temp: 97.6 F (36.4 C) (05/25 1727) Temp Source: Oral (05/25 1727) BP: 112/78 (05/25 1727) Pulse Rate: 65 (05/25 1727)  Labs: Recent Labs    02/28/22 2344 03/01/22 0137 03/01/22 0711 03/01/22 1152 03/01/22 1736  HGB 12.3*  --   --   --   --   HCT 38.3*  --   --   --   --   PLT 281  --   --   --   --   HEPARINUNFRC  --   --   --  0.39 0.41  CREATININE 3.34*  --  3.29*  --   --   TROPONINIHS 85* 106* 201*  --   --      Estimated Creatinine Clearance: 30.5 mL/min (A) (by C-G formula based on SCr of 3.29 mg/dL (H)).  Assessment: 71yo male on heparin for ?NSTEMI and r/o PE.   Heparin level 0.41 (on heparin 1400 units/hr) No signs/symptoms of bleed  Goal of Therapy:  Heparin level 0.3-0.7 units/ml Monitor platelets by anticoagulation protocol: Yes   Plan:  Continue Heparin 1400 units/hr  Daily Heparin level and CBC Monitor signs/symptoms of bleed F/u VQ scan  Thank you for allowing pharmacy to be a part of this patient's care.  Donnald Garre, PharmD Clinical Pharmacist  Please check AMION for all [place] numbers After 10:00 PM, call [place] [phone]

## 2022-03-01 NOTE — Consult Note (Signed)
Ref: [name], [name], MD   Subjective:  Awake. No chest pain.  VS stable except mild respiratory distress continues. Aware of diastolic left heart failure, Chronic PE, pulmonary hypertension, fatty liver and renal dysfunction. Echocardiogram shows good LV systolic function with mild LVH but severe RV systolic dysfunction and pulmonary hypertension.  Objective:  Vital Signs in the last 24 hours: Temp:  [97.7 F (36.5 C)-97.8 F (36.6 C)] 97.7 F (36.5 C) (05/25 0804) Pulse Rate:  [66-78] 66 (05/25 0804) Cardiac Rhythm: Normal sinus rhythm (05/25 0852) Resp:  [14-29] 19 (05/25 0804) BP: (98-120)/(67-79) 110/72 (05/25 0804) SpO2:  [89 %-99 %] 96 % (05/25 0804) Weight:  [127 kg-138 kg] 138 kg (05/25 0424)  Physical Exam: BP Readings from Last 1 Encounters:  03/01/22 110/72     Wt Readings from Last 1 Encounters:  03/01/22 (!) 138 kg    Weight change:  Body mass index is 41.26 kg/m. HEENT: [place]/AT, Eyes-Brown, Conjunctiva-Pink, Sclera-Non-icteric Neck: No JVD, No bruit, Trachea midline. Lungs:  Clearing, Bilateral. Cardiac:  Regular rhythm, normal S1 and S2, no S3. II/VI systolic murmur. Abdomen:  Soft, non-tender. BS present. Extremities:  2 + lower leg edema present. No cyanosis. No clubbing. CNS: AxOx3, Cranial nerves grossly intact, moves all 4 extremities.  Skin: Warm and dry.   Intake/Output from previous day: 05/24 0701 - 05/25 0700 In: 295.7 [P.O.:240; I.V.:55.7] Out: 175 [Urine:175]    Lab Results: BMET    Component Value Date/Time   NA 140 03/01/2022 0711   NA 141 02/28/2022 2344   NA 142 01/17/2022 1140   K 4.0 03/01/2022 0711   K 4.0 02/28/2022 2344   K 4.0 01/17/2022 1140   CL 100 03/01/2022 0711   CL 101 02/28/2022 2344   CL 105 01/17/2022 1140   CO2 28 03/01/2022 0711   CO2 22 02/28/2022 2344   CO2 28 01/17/2022 1140   GLUCOSE 142 (H) 03/01/2022 0711   GLUCOSE 168 (H) 02/28/2022 2344   GLUCOSE 111 (H) 01/17/2022 1140   GLUCOSE 100 (H)  09/09/2006 0956   BUN 53 (H) 03/01/2022 0711   BUN 50 (H) 02/28/2022 2344   BUN 30 (H) 01/17/2022 1140   CREATININE 3.29 (H) 03/01/2022 0711   CREATININE 3.34 (H) 02/28/2022 2344   CREATININE 1.72 (H) 01/17/2022 1140   CALCIUM 9.2 03/01/2022 0711   CALCIUM 9.3 02/28/2022 2344   CALCIUM 9.9 01/17/2022 1140   GFRNONAA 20 (L) 03/01/2022 0711   GFRNONAA 19 (L) 02/28/2022 2344   GFRNONAA 59 (L) 09/04/2021 0657   GFRAA >60 07/11/2020 0619   GFRAA >60 07/05/2020 0456   GFRAA >60 07/03/2020 0640   CBC    Component Value Date/Time   WBC 8.5 02/28/2022 2344   RBC 3.92 (L) 02/28/2022 2344   HGB 12.3 (L) 02/28/2022 2344   HCT 38.3 (L) 02/28/2022 2344   PLT 281 02/28/2022 2344   MCV 97.7 02/28/2022 2344   MCH 31.4 02/28/2022 2344   MCHC 32.1 02/28/2022 2344   RDW 17.3 (H) 02/28/2022 2344   LYMPHSABS 1.7 02/28/2022 2344   MONOABS 1.1 (H) 02/28/2022 2344   EOSABS 0.1 02/28/2022 2344   BASOSABS 0.0 02/28/2022 2344   HEPATIC Function Panel Recent Labs    01/17/22 1140 02/28/22 2344 03/01/22 0711  PROT 8.0 7.9 7.7   HEMOGLOBIN A1C No components found for: HGA1C,  MPG CARDIAC ENZYMES No results found for: CKTOTAL, CKMB, CKMBINDEX, TROPONINI BNP No results for input(s): PROBNP in the last 8760 hours. TSH No results  for input(s): TSH in the last 8760 hours. CHOLESTEROL Recent Labs    01/17/22 1140  CHOL 140    Scheduled Meds:  cloNIDine  0.1 mg Oral Daily   DULoxetine  60 mg Oral Daily   gabapentin  300 mg Oral BID   levETIRAcetam  250 mg Oral BID   mouth rinse  15 mL Mouth Rinse BID   multivitamin with minerals  1 tablet Oral Q breakfast   Continuous Infusions:  heparin 1,400 Units/hr (03/01/22 0800)   PRN Meds:.oxyCODONE-acetaminophen  Assessment/Plan:  Acute coronary syndrome Acute diastolic left heart failure Chronic RV failure Abnormal troponin I from demand ischemia Chronic PE Pulmonary hypertension HTN HLD Type 2 DM Acute renal failure Obesity, BMI  35-40 Kg/M2  Plan: Discussed R + L heart catheterization.    LOS: 0 days   Time spent including chart review, lab review, examination, discussion with patient/Family : 30 min   Dixie Dials  MD  03/01/2022, 11:29 AM

## 2022-03-01 NOTE — Progress Notes (Signed)
ANTICOAGULATION CONSULT NOTE - Initial Consult  Pharmacy Consult for heparin Indication: chest pain/ACS  Allergies  Allergen Reactions   Aspirin Other (See Comments)    Irritates the stomach and the patient developed ulcers, also   Lisinopril Other (See Comments)    Caused a body ache    Patient Measurements: Height: 6' (182.9 cm) Weight: 127 kg (280 lb) IBW/kg (Calculated) : 77.6 Heparin Dosing Weight: 105kg  Vital Signs: Temp: 97.7 F (36.5 C) (05/25 0032) Temp Source: Oral (05/25 0032) BP: 105/76 (05/25 0100) Pulse Rate: 71 (05/25 0100)  Labs: Recent Labs    02/28/22 2344  HGB 12.3*  HCT 38.3*  PLT 281  CREATININE 3.34*  TROPONINIHS 85*    Estimated Creatinine Clearance: 28.8 mL/min (A) (by C-G formula based on SCr of 3.34 mg/dL (H)).   Medical History: Past Medical History:  Diagnosis Date   Allergy    Chronic back pain    Diabetes mellitus without complication ([place])    DNR (do not resuscitate) 11/19/2020   Duodenal ulcer hemorrhage 08/29/2014   ED (erectile dysfunction)    Esophageal stricture 08/30/2014   Glucose intolerance (impaired glucose tolerance)    Hiatal hernia 08/30/2014   Hypertension    Hypokalemia 04/11/2013   MRSA carrier 08/30/2014   Obesity    Osteoarthritis    Pneumonia    Pulmonary embolism ([place]) 06/2020   Scoliosis 2016   Spinal stenosis of lumbar region    Urinary tract infection 04/11/2013    Assessment: 71yo male c/o weakness and pain from right hand to across chest, troponin found to be elevated >> to begin heparin.  Goal of Therapy:  Heparin level 0.3-0.7 units/ml Monitor platelets by anticoagulation protocol: Yes   Plan:  Heparin 4000 units IV bolus x1 followed by infusion at 1400 units/hr and monitor heparin levels and CBC.  Wynona Neat, PharmD, BCPS  03/01/2022,2:33 AM

## 2022-03-02 ENCOUNTER — Encounter (HOSPITAL_COMMUNITY): Payer: Medicare Other

## 2022-03-02 ENCOUNTER — Inpatient Hospital Stay (HOSPITAL_COMMUNITY)
Admission: EM | Disposition: A | Discharge status: Home / Self Care | Payer: Self-pay | Source: Home / Self Care | Attending: Family Medicine

## 2022-03-02 ENCOUNTER — Encounter (HOSPITAL_COMMUNITY): Payer: Self-pay | Admitting: Cardiovascular Disease

## 2022-03-02 DIAGNOSIS — R079 Chest pain, unspecified: Secondary | ICD-10-CM | POA: Diagnosis not present

## 2022-03-02 HISTORY — PX: RIGHT/LEFT HEART CATH AND CORONARY ANGIOGRAPHY: CATH118266

## 2022-03-02 LAB — CBC
HCT: 33.1 % — ABNORMAL LOW (ref 39.0–52.0)
HCT: 32.8 % — ABNORMAL LOW (ref 39.0–52.0)
Hemoglobin: 10.5 g/dL — ABNORMAL LOW (ref 13.0–17.0)
Hemoglobin: 10.9 g/dL — ABNORMAL LOW (ref 13.0–17.0)
MCH: 30.9 pg (ref 26.0–34.0)
MCH: 31.9 pg (ref 26.0–34.0)
MCHC: 31.7 g/dL (ref 30.0–36.0)
MCHC: 33.2 g/dL (ref 30.0–36.0)
MCV: 97.4 fL (ref 80.0–100.0)
MCV: 95.9 fL (ref 80.0–100.0)
Platelets: 221 10*3/uL (ref 150–400)
Platelets: 222 10*3/uL (ref 150–400)
RBC: 3.4 MIL/uL — ABNORMAL LOW (ref 4.22–5.81)
RBC: 3.42 MIL/uL — ABNORMAL LOW (ref 4.22–5.81)
RDW: 17 % — ABNORMAL HIGH (ref 11.5–15.5)
RDW: 16.9 % — ABNORMAL HIGH (ref 11.5–15.5)
WBC: 6.9 10*3/uL (ref 4.0–10.5)
WBC: 6.8 10*3/uL (ref 4.0–10.5)
nRBC: 0.7 % — ABNORMAL HIGH (ref 0.0–0.2)
nRBC: 0.9 % — ABNORMAL HIGH (ref 0.0–0.2)

## 2022-03-02 LAB — RENAL FUNCTION PANEL
Albumin: 3.6 g/dL (ref 3.5–5.0)
Anion gap: 12 (ref 5–15)
BUN: 52 mg/dL — ABNORMAL HIGH (ref 8–23)
CO2: 27 mmol/L (ref 22–32)
Calcium: 9.1 mg/dL (ref 8.9–10.3)
Chloride: 100 mmol/L (ref 98–111)
Creatinine, Ser: 2.41 mg/dL — ABNORMAL HIGH (ref 0.61–1.24)
GFR, Estimated: 28 mL/min — ABNORMAL LOW (ref >60)
Glucose, Bld: 131 mg/dL — ABNORMAL HIGH (ref 70–99)
Phosphorus: 3.2 mg/dL (ref 2.5–4.6)
Potassium: 3.7 mmol/L (ref 3.5–5.1)
Sodium: 139 mmol/L (ref 135–145)

## 2022-03-02 LAB — POCT I-STAT EG7
Acid-Base Excess: 9 mmol/L — ABNORMAL HIGH (ref 0.0–2.0)
Bicarbonate: 35.2 mmol/L — ABNORMAL HIGH (ref 20.0–28.0)
Calcium, Ion: 1.24 mmol/L (ref 1.15–1.40)
HCT: 34 % — ABNORMAL LOW (ref 39.0–52.0)
Hemoglobin: 11.6 g/dL — ABNORMAL LOW (ref 13.0–17.0)
O2 Saturation: 57 %
Potassium: 3.6 mmol/L (ref 3.5–5.1)
Sodium: 140 mmol/L (ref 135–145)
TCO2: 37 mmol/L — ABNORMAL HIGH (ref 22–32)
pCO2, Ven: 54.8 mmHg (ref 44–60)
pH, Ven: 7.416 (ref 7.25–7.43)
pO2, Ven: 30 mmHg — CL (ref 32–45)

## 2022-03-02 LAB — POCT I-STAT 7, (LYTES, BLD GAS, ICA,H+H)
Acid-Base Excess: 6 mmol/L — ABNORMAL HIGH (ref 0.0–2.0)
Acid-Base Excess: 6 mmol/L — ABNORMAL HIGH (ref 0.0–2.0)
Bicarbonate: 31 mmol/L — ABNORMAL HIGH (ref 20.0–28.0)
Bicarbonate: 31.2 mmol/L — ABNORMAL HIGH (ref 20.0–28.0)
Calcium, Ion: 1.23 mmol/L (ref 1.15–1.40)
Calcium, Ion: 1.23 mmol/L (ref 1.15–1.40)
HCT: 34 % — ABNORMAL LOW (ref 39.0–52.0)
HCT: 34 % — ABNORMAL LOW (ref 39.0–52.0)
Hemoglobin: 11.6 g/dL — ABNORMAL LOW (ref 13.0–17.0)
Hemoglobin: 11.6 g/dL — ABNORMAL LOW (ref 13.0–17.0)
O2 Saturation: 96 %
O2 Saturation: 97 %
Potassium: 3.4 mmol/L — ABNORMAL LOW (ref 3.5–5.1)
Potassium: 3.4 mmol/L — ABNORMAL LOW (ref 3.5–5.1)
Sodium: 140 mmol/L (ref 135–145)
Sodium: 141 mmol/L (ref 135–145)
TCO2: 32 mmol/L (ref 22–32)
TCO2: 33 mmol/L — ABNORMAL HIGH (ref 22–32)
pCO2 arterial: 46.1 mmHg (ref 32–48)
pCO2 arterial: 46.7 mmHg (ref 32–48)
pH, Arterial: 7.429 (ref 7.35–7.45)
pH, Arterial: 7.438 (ref 7.35–7.45)
pO2, Arterial: 84 mmHg (ref 83–108)
pO2, Arterial: 84 mmHg (ref 83–108)

## 2022-03-02 LAB — COMPREHENSIVE METABOLIC PANEL
ALT: 72 U/L — ABNORMAL HIGH (ref 0–44)
AST: 84 U/L — ABNORMAL HIGH (ref 15–41)
Albumin: 3.3 g/dL — ABNORMAL LOW (ref 3.5–5.0)
Alkaline Phosphatase: 97 U/L (ref 38–126)
Anion gap: 11 (ref 5–15)
BUN: 46 mg/dL — ABNORMAL HIGH (ref 8–23)
CO2: 26 mmol/L (ref 22–32)
Calcium: 9.1 mg/dL (ref 8.9–10.3)
Chloride: 99 mmol/L (ref 98–111)
Creatinine, Ser: 2.12 mg/dL — ABNORMAL HIGH (ref 0.61–1.24)
GFR, Estimated: 33 mL/min — ABNORMAL LOW (ref >60)
Glucose, Bld: 154 mg/dL — ABNORMAL HIGH (ref 70–99)
Potassium: 4.8 mmol/L (ref 3.5–5.1)
Sodium: 136 mmol/L (ref 135–145)
Total Bilirubin: 1 mg/dL (ref 0.3–1.2)
Total Protein: 7.1 g/dL (ref 6.5–8.1)

## 2022-03-02 LAB — HEPARIN LEVEL (UNFRACTIONATED): Heparin Unfractionated: 0.45 IU/mL (ref 0.30–0.70)

## 2022-03-02 SURGERY — RIGHT/LEFT HEART CATH AND CORONARY ANGIOGRAPHY
Anesthesia: LOCAL

## 2022-03-02 MED ORDER — APIXABAN 5 MG PO TABS: 5.0000 mg | ORAL_TABLET | Freq: Two times a day (BID) | ORAL | Status: DC

## 2022-03-02 MED ORDER — SODIUM CHLORIDE 0.9% FLUSH
3.0000 mL | Freq: Two times a day (BID) | INTRAVENOUS | Status: DC
  Administered 2022-03-02 – 2022-03-05 (×6): 3 mL via INTRAVENOUS

## 2022-03-02 MED ORDER — MIDAZOLAM HCL 2 MG/2ML IJ SOLN
INTRAMUSCULAR | Status: AC
  Filled 2022-03-02: qty 2

## 2022-03-02 MED ORDER — ONDANSETRON HCL 4 MG/2ML IJ SOLN: 4.0000 mg | Freq: Four times a day (QID) | INTRAMUSCULAR | Status: DC | PRN

## 2022-03-02 MED ORDER — LABETALOL HCL 5 MG/ML IV SOLN: 10.0000 mg | INTRAVENOUS | Status: AC | PRN

## 2022-03-02 MED ORDER — DILTIAZEM HCL-DEXTROSE 125-5 MG/125ML-% IV SOLN (PREMIX)
5.0000 mg/h | INTRAVENOUS | Status: DC
  Administered 2022-03-02: 5 mg/h via INTRAVENOUS
  Filled 2022-03-02: qty 125

## 2022-03-02 MED ORDER — MIDAZOLAM HCL 2 MG/2ML IJ SOLN
INTRAMUSCULAR | Status: DC | PRN
  Administered 2022-03-02 (×2): 1 mg via INTRAVENOUS

## 2022-03-02 MED ORDER — APIXABAN 5 MG PO TABS
10.0000 mg | ORAL_TABLET | Freq: Two times a day (BID) | ORAL | Status: DC
  Administered 2022-03-02 – 2022-03-06 (×8): 10 mg via ORAL
  Filled 2022-03-02 (×8): qty 2

## 2022-03-02 MED ORDER — SODIUM CHLORIDE 0.9% FLUSH: 3.0000 mL | INTRAVENOUS | Status: DC | PRN

## 2022-03-02 MED ORDER — DILTIAZEM LOAD VIA INFUSION
5.0000 mg | Freq: Once | INTRAVENOUS | Status: AC
  Administered 2022-03-02: 5 mg via INTRAVENOUS
  Filled 2022-03-02: qty 5

## 2022-03-02 MED ORDER — HEPARIN (PORCINE) IN NACL 1000-0.9 UT/500ML-% IV SOLN
INTRAVENOUS | Status: DC | PRN
Start: 2022-03-02 — End: 2022-03-02
  Administered 2022-03-02 (×2): 500 mL

## 2022-03-02 MED ORDER — SODIUM CHLORIDE 0.9 % IV SOLN: 250.0000 mL | INTRAVENOUS | Status: DC | PRN

## 2022-03-02 MED ORDER — IOHEXOL 350 MG/ML SOLN
INTRAVENOUS | Status: DC | PRN
Start: 2022-03-02 — End: 2022-03-02
  Administered 2022-03-02: 30 mL

## 2022-03-02 MED ORDER — FENTANYL CITRATE (PF) 100 MCG/2ML IJ SOLN
INTRAMUSCULAR | Status: DC | PRN
  Administered 2022-03-02: 25 ug via INTRAVENOUS
  Administered 2022-03-02: 50 ug via INTRAVENOUS

## 2022-03-02 MED ORDER — HYDRALAZINE HCL 20 MG/ML IJ SOLN: 10.0000 mg | INTRAMUSCULAR | Status: AC | PRN

## 2022-03-02 MED ORDER — LIDOCAINE HCL (PF) 1 % IJ SOLN
INTRAMUSCULAR | Status: AC
  Filled 2022-03-02: qty 30

## 2022-03-02 MED ORDER — HEPARIN (PORCINE) IN NACL 1000-0.9 UT/500ML-% IV SOLN
INTRAVENOUS | Status: AC
  Filled 2022-03-02: qty 500

## 2022-03-02 MED ORDER — MORPHINE SULFATE (PF) 2 MG/ML IV SOLN
INTRAVENOUS | Status: DC | PRN
  Administered 2022-03-02: 2 mg via INTRAVENOUS

## 2022-03-02 MED ORDER — MORPHINE SULFATE (PF) 2 MG/ML IV SOLN
INTRAVENOUS | Status: AC
  Filled 2022-03-02: qty 1

## 2022-03-02 MED ORDER — FENTANYL CITRATE (PF) 100 MCG/2ML IJ SOLN
INTRAMUSCULAR | Status: AC
  Filled 2022-03-02: qty 2

## 2022-03-02 MED ORDER — SODIUM CHLORIDE 0.9% FLUSH
3.0000 mL | Freq: Two times a day (BID) | INTRAVENOUS | Status: DC
  Administered 2022-03-02 – 2022-03-05 (×7): 3 mL via INTRAVENOUS

## 2022-03-02 MED ORDER — SODIUM CHLORIDE 0.9 % IV SOLN: INTRAVENOUS | Status: DC

## 2022-03-02 MED ORDER — ACETAMINOPHEN 325 MG PO TABS: 650.0000 mg | ORAL_TABLET | ORAL | Status: DC | PRN

## 2022-03-02 SURGICAL SUPPLY — 12 items
CATH INFINITI 5FR MULTPACK ANG (CATHETERS) ×1 IMPLANT
CATH SWAN GANZ 7F STRAIGHT (CATHETERS) ×1 IMPLANT
GLIDESHEATH SLEND SS 6F .021 (SHEATH) IMPLANT
GUIDEWIRE INQWIRE 1.5J.035X260 (WIRE) IMPLANT
INQWIRE 1.5J .035X260CM (WIRE) ×2
KIT HEART LEFT (KITS) ×2 IMPLANT
PACK CARDIAC CATHETERIZATION (CUSTOM PROCEDURE TRAY) ×2 IMPLANT
SHEATH GLIDE SLENDER 4/5FR (SHEATH) IMPLANT
SHEATH PINNACLE 5F 10CM (SHEATH) ×1 IMPLANT
SHEATH PINNACLE 7F 10CM (SHEATH) ×1 IMPLANT
SHEATH PROBE COVER 6X72 (BAG) ×1 IMPLANT
TRANSDUCER W/STOPCOCK (MISCELLANEOUS) ×2 IMPLANT

## 2022-03-02 NOTE — Progress Notes (Signed)
[place] for heparin Indication: chest pain/ACS vs PE  Allergies  Allergen Reactions   Aspirin Other (See Comments)    Irritates the stomach and the patient developed ulcers, also   Lisinopril Other (See Comments)    Caused a body ache    Patient Measurements: Height: 6' (182.9 cm) Weight: (!) 138 kg (304 lb 3.8 oz) IBW/kg (Calculated) : 77.6 Heparin Dosing Weight: 105kg  Vital Signs: Temp: 97.7 F (36.5 C) (05/26 0458) Temp Source: Oral (05/26 0458) BP: 113/75 (05/26 0458) Pulse Rate: 61 (05/26 0458)  Labs: Recent Labs    02/28/22 2344 03/01/22 0137 03/01/22 0711 03/01/22 1152 03/01/22 1736 03/02/22 0054  HGB 12.3*  --   --   --   --  10.5*  HCT 38.3*  --   --   --   --  33.1*  PLT 281  --   --   --   --  221  HEPARINUNFRC  --   --   --  0.39 0.41 0.45  CREATININE 3.34*  --  3.29*  --   --   --   TROPONINIHS 85* 106* 201*  --   --   --     Estimated Creatinine Clearance: 30.5 mL/min (A) (by C-G formula based on SCr of 3.29 mg/dL (H)).  Assessment: 71yo male with hx extensive BL saddle PE 06/26/20 with residual chronic nonocclusive PE now with high suspicion on PE on VQ scan. Patient also admitted with rising troponins and likely NSTEMI. No anticoagulation prior to admission. Pharmacy consulted for heparin.     Heparin level 0.45 is therapeutic on 1400 units/hr. .H/H drop likely d/t fluids and blood draws, plt stable. No reported bleeding. Possible heart cath this admission.    Goal of Therapy:  Heparin level 0.3-0.7 units/ml Monitor platelets by anticoagulation protocol: Yes   Plan:  Continue Heparin 1400 units/hr  Daily Heparin level and CBC Monitor signs/symptoms of bleed F/u VQ scan  Thank you for allowing pharmacy to be a part of this patient's care.  Benetta Spar, PharmD, BCPS, BCCP Clinical Pharmacist  Please check AMION for all [place] phone numbers After 10:00 PM, call [place] [phone]

## 2022-03-02 NOTE — Progress Notes (Signed)
Ref: [name], Keith G, MD   Subjective:  Awake. Decreasing chest discomfort and shortness of breath. VS stable.  Objective:  Vital Signs in the last 24 hours: Temp:  [97.6 F (36.4 C)-97.8 F (36.6 C)] 97.8 F (36.6 C) (05/26 0832) Pulse Rate:  [60-67] 60 (05/26 0832) Cardiac Rhythm: Normal sinus rhythm;Heart block (05/26 0700) Resp:  [13-18] 16 (05/26 0832) BP: (104-120)/(70-81) 120/77 (05/26 0832) SpO2:  [88 %-99 %] 96 % (05/26 1119)  Physical Exam: BP Readings from Last 1 Encounters:  03/02/22 120/77     Wt Readings from Last 1 Encounters:  03/01/22 (!) 138 kg    Weight change:  Body mass index is 41.26 kg/m. HEENT: [place]/AT, Eyes-Brown, Conjunctiva-Pink, Sclera-Non-icteric Neck: No JVD, No bruit, Trachea midline. Lungs:  Clearing, Bilateral. Cardiac:  Regular rhythm, normal S1 and S2, no S3. II/VI systolic murmur. Abdomen:  Soft, non-tender. BS present. Extremities:  2 + edema present. No cyanosis. No clubbing. CNS: AxOx3, Cranial nerves grossly intact, moves all 4 extremities.  Skin: Warm and dry.   Intake/Output from previous day: 05/25 0701 - 05/26 0700 In: 846.4 [P.O.:565; I.V.:281.4] Out: 975 [Urine:975]    Lab Results: BMET    Component Value Date/Time   NA 139 03/02/2022 0054   NA 140 03/01/2022 0711   NA 141 02/28/2022 2344   K 3.7 03/02/2022 0054   K 4.0 03/01/2022 0711   K 4.0 02/28/2022 2344   CL 100 03/02/2022 0054   CL 100 03/01/2022 0711   CL 101 02/28/2022 2344   CO2 27 03/02/2022 0054   CO2 28 03/01/2022 0711   CO2 22 02/28/2022 2344   GLUCOSE 131 (H) 03/02/2022 0054   GLUCOSE 142 (H) 03/01/2022 0711   GLUCOSE 168 (H) 02/28/2022 2344   GLUCOSE 100 (H) 09/09/2006 0956   BUN 52 (H) 03/02/2022 0054   BUN 53 (H) 03/01/2022 0711   BUN 50 (H) 02/28/2022 2344   CREATININE 2.41 (H) 03/02/2022 0054   CREATININE 3.29 (H) 03/01/2022 0711   CREATININE 3.34 (H) 02/28/2022 2344   CALCIUM 9.1 03/02/2022 0054   CALCIUM 9.2 03/01/2022 0711    CALCIUM 9.3 02/28/2022 2344   GFRNONAA 28 (L) 03/02/2022 0054   GFRNONAA 20 (L) 03/01/2022 0711   GFRNONAA 19 (L) 02/28/2022 2344   GFRAA >60 07/11/2020 0619   GFRAA >60 07/05/2020 0456   GFRAA >60 07/03/2020 0640   CBC    Component Value Date/Time   WBC 6.9 03/02/2022 0054   RBC 3.40 (L) 03/02/2022 0054   HGB 10.5 (L) 03/02/2022 0054   HCT 33.1 (L) 03/02/2022 0054   PLT 221 03/02/2022 0054   MCV 97.4 03/02/2022 0054   MCH 30.9 03/02/2022 0054   MCHC 31.7 03/02/2022 0054   RDW 17.0 (H) 03/02/2022 0054   LYMPHSABS 1.7 02/28/2022 2344   MONOABS 1.1 (H) 02/28/2022 2344   EOSABS 0.1 02/28/2022 2344   BASOSABS 0.0 02/28/2022 2344   HEPATIC Function Panel Recent Labs    01/17/22 1140 02/28/22 2344 03/01/22 0711  PROT 8.0 7.9 7.7   HEMOGLOBIN A1C No components found for: HGA1C,  MPG CARDIAC ENZYMES No results found for: CKTOTAL, CKMB, CKMBINDEX, TROPONINI BNP No results for input(s): PROBNP in the last 8760 hours. TSH No results for input(s): TSH in the last 8760 hours. CHOLESTEROL Recent Labs    01/17/22 1140  CHOL 140    Scheduled Meds:  [MAR Hold] cloNIDine  0.1 mg Oral Daily   [MAR Hold] DULoxetine  60 mg Oral Daily   [  MAR Hold] gabapentin  300 mg Oral BID   [MAR Hold] levETIRAcetam  250 mg Oral BID   [MAR Hold] mouth rinse  15 mL Mouth Rinse BID   [MAR Hold] multivitamin with minerals  1 tablet Oral Q breakfast   [MAR Hold] sodium chloride flush  3 mL Intravenous Q12H   Continuous Infusions:  sodium chloride     sodium chloride     heparin 1,400 Units/hr (03/02/22 0435)   PRN Meds:.sodium chloride, [MAR Hold] oxyCODONE-acetaminophen, sodium chloride flush  Assessment/Plan:  Acute coronary syndrome Acute diastolic left heart failure Chronic RV failure Abnormal Troponin I from demand ischemia Moderate Pulmonary HTN HTN HLD Type 2 DM Acute renal failure, mildly improved Morbid obesity  Plan: R + Left heart catheterization.    LOS: 1 day    Time spent including chart review, lab review, examination, discussion with patient : 30 min   Dixie Dials  MD  03/02/2022, 11:26 AM

## 2022-03-02 NOTE — Progress Notes (Signed)
PROGRESS NOTE   Keith [name]  PRF:[id number] DOB: 1951/04/19 DOA: 02/28/2022 PCP: [name], [name], MD  Brief Narrative:   71 home dwelling black male Known saddle pulmonary embolism/eetvt [id number] complicated by significant hypotension needed Levophed--also fell and had malleoli or ankle fracture in her requiring ORIF and was discharged to CIR--- stopped anticoagulation apparently 11/2020 Prior pleural effusion 11/2020 secondary to pneumonia?  Exudative Left adrenal adenoma DM TY 2 with neuropathy +/- hypoglycemia Class II obesity HTN Chronic pain secondary to cervicalgia on opiates + depression   Office communications from 5/23 note patient was missed taking his duloxetine-taking 2 capsules instead of 1-developed blood pressures in the 90s, dizziness dry mouth nausea nation constipation-then subsequently apparently developed some pain across his chest-with radiation and SOB-worse with movement exercise exertion--?  Pleuritic component   Hospital-Problem based course  Acute PE [has had prior saddle embolus 70/2021 requiring Levophed because of hypotension] Continue heparin GTT--get duplex lower extremity rule out DVT Hemodynamically seems stable at this time Severe pulmonary hypertension [? WHO grp iv [Chr VTE] Right and left cardiac catheterization planned by Dr. Doylene Canard RV EF severely reduced severe pulmonary arterial pressure elevation Defer management to Dr. Harrel Lemon require referral to advanced heart failure team additionally I have provided him with literature about this DM TY 2+ neuropathy A1c 6.6 01/17/2022 CBGs ranging 1 30-1 68 eating 100% of meals but is n.p.o. now Will require outpatient discussion about this-May need to start the patient on metformin or other agents Continue gabapentin 600 3 times daily-note high dose and need for dosage adjustment in the outpatient setting HTN Tenoretic held from admission-patient had AKI on admission from  this Continue clonidine 0.1 daily Chronic cervicalgia degenerative disc disease and depression Continue Cymbalta 60 daily oxycodone 2 tabs every 6 as needed as per home regimen  DVT prophylaxis: On heparin currently Code Status:DNAR Family Communication: No one present At bedside Long and detailed discussion with the patient at the bedside regarding need for catheterization and brief discussion about pulmonary hypertension Disposition:  Status is: Inpatient Remains inpatient appropriate because:   Needs cardiac cath May need further work-up in the outpatient setting   Consultants:  Cardiology  Procedures:   Antimicrobials:   Subjective: Awake coherent many questions about his diagnosis-seems to understand after explanation no chest pain no fever no chills no no nausea no vomiting  Objective: Vitals:   03/01/22 2345 03/02/22 0400 03/02/22 0458 03/02/22 0832  BP: 105/78  113/75 120/77  Pulse: 60 62 61 60  Resp: '18 17  16  '$ Temp: 97.8 F (36.6 C)  97.7 F (36.5 C) 97.8 F (36.6 C)  TempSrc: Oral  Oral Oral  SpO2: 99% (!) 88%  96%  Weight:      Height:        Intake/Output Summary (Last 24 hours) at 03/02/2022 1023 Last data filed at 03/02/2022 0459 Gross per 24 hour  Intake 818.41 ml  Output 975 ml  Net -156.59 ml   Filed Weights   02/28/22 2338 03/01/22 0200 03/01/22 0424  Weight: 127 kg 127 kg (!) 138 kg    Examination:  EOMI NCAT no focal deficit no wheeze Neck soft supple S1-S2 no murmur mild JVD No bruit Displaced PMI Abdomen soft no rebound no guarding obese Slight  swollen left leg >right leg Power 5/5  Data Reviewed: personally reviewed   CBC    Component Value Date/Time   WBC 6.9 03/02/2022 0054   RBC 3.40 (L) 03/02/2022 0054   HGB 10.5 (  L) 03/02/2022 0054   HCT 33.1 (L) 03/02/2022 0054   PLT 221 03/02/2022 0054   MCV 97.4 03/02/2022 0054   MCH 30.9 03/02/2022 0054   MCHC 31.7 03/02/2022 0054   RDW 17.0 (H) 03/02/2022 0054   LYMPHSABS  1.7 02/28/2022 2344   MONOABS 1.1 (H) 02/28/2022 2344   EOSABS 0.1 02/28/2022 2344   BASOSABS 0.0 02/28/2022 2344      Latest Ref Rng & Units 03/02/2022   12:54 AM 03/01/2022    7:11 AM 02/28/2022   11:44 PM  CMP  Glucose 70 - 99 mg/dL 131   142   168    BUN 8 - 23 mg/dL 52   53   50    Creatinine 0.61 - 1.24 mg/dL 2.41   3.29   3.34    Sodium 135 - 145 mmol/L 139   140   141    Potassium 3.5 - 5.1 mmol/L 3.7   4.0   4.0    Chloride 98 - 111 mmol/L 100   100   101    CO2 22 - 32 mmol/L '27   28   22    '$ Calcium 8.9 - 10.3 mg/dL 9.1   9.2   9.3    Total Protein 6.5 - 8.1 g/dL  7.7   7.9    Total Bilirubin 0.3 - 1.2 mg/dL  1.2   1.5    Alkaline Phos 38 - 126 U/L  109   130    AST 15 - 41 U/L  61   87    ALT 0 - 44 U/L  78   85       Radiology Studies: DG Chest 2 View  Result Date: 02/28/2022 CLINICAL DATA:  Chest pain and weakness. EXAM: CHEST - 2 VIEW COMPARISON:  September 19, 2021 FINDINGS: There is stable mild to moderate severity enlargement of the cardiac silhouette. There is marked severity tortuosity of the descending thoracic aorta. Both lungs are clear. Thin, curvilinear wire like material is seen overlying the lateral aspect of the mid left chest wall on the frontal view. There is mild to moderate severity dextroscoliosis of the mid to lower thoracic spine with multilevel degenerative changes. Postoperative changes are noted within the visualized portion of the upper lumbar spine. IMPRESSION: 1. Stable cardiomegaly with marked severity tortuosity of the descending thoracic aorta. 2. No acute or active cardiopulmonary disease. Electronically Signed   By: Virgina Norfolk M.D.   On: 02/28/2022 23:56   NM Pulmonary Perfusion  Result Date: 03/01/2022 CLINICAL DATA:  Pulmonary embolism suspected, high probability. EXAM: NUCLEAR MEDICINE PERFUSION LUNG SCAN TECHNIQUE: Perfusion images were obtained in multiple projections after intravenous injection of radiopharmaceutical. Ventilation  scans intentionally deferred if perfusion scan and chest x-ray adequate for interpretation during COVID 19 epidemic. RADIOPHARMACEUTICALS:  4.3 mCi Tc-51mMAA IV COMPARISON:  Chest radiography yesterday.  Chest CT 12/20/2020. FINDINGS: There are segmental perfusion defects in both lungs suspicious for pulmonary embolism. Likelihood is moderate probability to high probability. No lobar or greater perfusion defect. IMPRESSION: Bilateral segmental perfusion defects likely to indicate pulmonary embolism. Likelihood is moderate probability to high probability. Electronically Signed   By: MNelson ChimesM.D.   On: 03/01/2022 15:17   [place]domen Complete  Result Date: 03/01/2022 CLINICAL DATA:  Acute kidney injury.  Elevated liver enzymes. EXAM: ABDOMEN ULTRASOUND COMPLETE COMPARISON:  None Available. FINDINGS: Gallbladder: No gallstones or wall thickening visualized. No sonographic Murphy sign noted by sonographer. Common bile duct: Diameter:  5-6 mm Liver: Coarsening of hepatic echotexture suggests fatty deposition. No focal intrahepatic parenchymal abnormality evident. Portal vein is patent on color Doppler imaging with normal direction of blood flow towards the liver. IVC: No abnormality visualized. Pancreas: Visualized portion unremarkable. Spleen: Size and appearance within normal limits. Right Kidney: Length: 10.1 cm. Echogenicity within normal limits. No mass or hydronephrosis visualized. Left Kidney: Length: 12.2 cm . Echogenicity within normal limits. No mass or hydronephrosis visualized.17 mm simple cyst evident similar to CT scan from 06/26/2020. Abdominal aorta: Obscured by overlying bowel gas. Other findings: No evidence for intraperitoneal free fluid. IMPRESSION: 1. No acute findings. There is a small cyst in the left kidney, similar to CT scan from 06/26/2020. 2. Coarsening of hepatic echotexture suggests fatty deposition. Electronically Signed   By: Misty Stanley M.D.   On: 03/01/2022 07:50    ECHOCARDIOGRAM COMPLETE  Result Date: 03/01/2022    ECHOCARDIOGRAM REPORT   Patient Name:   Keith [name] Date of Exam: 03/01/2022 Medical Rec #:  [id number]        Height:       72.0 in Accession #:    [id number]       Weight:       304.2 lb Date of Birth:  07/06/1951       BSA:          2.546 m Patient Age:    71 years         BP:           104/70 mmHg Patient Gender: M                HR:           66 bpm. Exam Location:  Inpatient Procedure: 2D Echo, Cardiac Doppler and Color Doppler Indications:    CHF  History:        Patient has prior history of Echocardiogram examinations, most                 recent 06/27/2020. Risk Factors:Hypertension and Diabetes.  Sonographer:    Jefferey Pica Referring Phys: [place]  Sonographer Comments: Patient is morbidly obese. Image acquisition challenging due to patient body habitus. IMPRESSIONS  1. Left ventricular ejection fraction, by estimation, is 55 to 60%. The left ventricle has normal function. The left ventricle demonstrates regional wall motion abnormalities (see scoring diagram/findings for description). There is mild concentric left ventricular hypertrophy. Left ventricular diastolic parameters are consistent with Grade I diastolic dysfunction (impaired relaxation). There is the interventricular septum is flattened in systole and diastole, consistent with right ventricular pressure and volume overload. There is mild hypokinesis of the left ventricular, entire septal wall.  2. Right ventricular systolic function is severely reduced. The right ventricular size is severely enlarged. There is severely elevated pulmonary artery systolic pressure.  3. Left atrial size was mildly dilated.  4. Right atrial size was severely dilated.  5. The mitral valve is degenerative. Trivial mitral valve regurgitation.  6. Tricuspid valve regurgitation is moderate to severe.  7. The aortic valve is tricuspid. There is mild calcification of the aortic valve. There is mild  thickening of the aortic valve. Aortic valve regurgitation is not visualized.  8. The inferior vena cava is normal in size with greater than 50% respiratory variability, suggesting right atrial pressure of 3 mmHg. FINDINGS  Left Ventricle: Left ventricular ejection fraction, by estimation, is 55 to 60%. The left ventricle has normal function. The left ventricle demonstrates regional wall motion  abnormalities. Mild hypokinesis of the left ventricular, entire septal wall. The left ventricular internal cavity size was normal in size. There is mild concentric left ventricular hypertrophy. The interventricular septum is flattened in systole and diastole, consistent with right ventricular pressure and volume overload. Left ventricular diastolic parameters are consistent with Grade I diastolic dysfunction (impaired relaxation). Right Ventricle: The right ventricular size is severely enlarged. Decreased right ventricular wall thickness. Right ventricular systolic function is severely reduced. There is severely elevated pulmonary artery systolic pressure. The tricuspid regurgitant velocity is 3.87 m/s, and with an assumed right atrial pressure of 15 mmHg, the estimated right ventricular systolic pressure is 33.8 mmHg. Left Atrium: Left atrial size was mildly dilated. Right Atrium: Right atrial size was severely dilated. Pericardium: There is no evidence of pericardial effusion. Mitral Valve: The mitral valve is degenerative in appearance. Trivial mitral valve regurgitation. Tricuspid Valve: The tricuspid valve is normal in structure. Tricuspid valve regurgitation is moderate to severe. Aortic Valve: The aortic valve is tricuspid. There is mild calcification of the aortic valve. There is mild thickening of the aortic valve. There is mild aortic valve annular calcification. Aortic valve regurgitation is not visualized. Aortic valve peak gradient measures 2.6 mmHg. Pulmonic Valve: The pulmonic valve was normal in structure.  Pulmonic valve regurgitation is mild. Aorta: The aortic root is normal in size and structure. There is minimal (Grade I) atheroma plaque involving the aortic root and ascending aorta. Venous: The inferior vena cava is normal in size with greater than 50% respiratory variability, suggesting right atrial pressure of 3 mmHg. IAS/Shunts: The atrial septum is grossly normal.  LEFT VENTRICLE PLAX 2D LVIDd:         3.60 cm LVIDs:         2.40 cm LV PW:         1.30 cm LV IVS:        1.30 cm LVOT diam:     2.20 cm LVOT Area:     3.80 cm  IVC IVC diam: 3.10 cm LEFT ATRIUM             Index        RIGHT ATRIUM           Index LA diam:        3.40 cm 1.34 cm/m   RA Area:     26.00 cm LA Vol (A2C):   54.0 ml 21.21 ml/m  RA Volume:   101.00 ml 39.66 ml/m LA Vol (A4C):   64.8 ml 25.45 ml/m LA Biplane Vol: 61.9 ml 24.31 ml/m  AORTIC VALVE             PULMONIC VALVE AV Vmax:      80.90 cm/s PV Vmax:       0.39 m/s AV Peak Grad: 2.6 mmHg   PV Peak grad:  0.6 mmHg  AORTA Ao Root diam: 3.90 cm Ao Asc diam:  3.70 cm MITRAL VALVE               TRICUSPID VALVE MV Area (PHT): 3.46 cm    TR Peak grad:   59.9 mmHg MV Decel Time: 220 msec    TR Vmax:        387.00 cm/s MV E velocity: 41.10 cm/s MV A velocity: 63.20 cm/s  SHUNTS MV E/A ratio:  0.65        Systemic Diam: 2.20 cm Dixie Dials MD Electronically signed by Dixie Dials MD Signature Date/Time: 03/01/2022/4:41:24 PM    Final  Scheduled Meds:  cloNIDine  0.1 mg Oral Daily   DULoxetine  60 mg Oral Daily   gabapentin  300 mg Oral BID   levETIRAcetam  250 mg Oral BID   mouth rinse  15 mL Mouth Rinse BID   multivitamin with minerals  1 tablet Oral Q breakfast   sodium chloride flush  3 mL Intravenous Q12H   Continuous Infusions:  sodium chloride     sodium chloride     heparin 1,400 Units/hr (03/02/22 0435)     LOS: 1 day   Time spent: [place], MD [facility] To contact the attending provider between 7A-7P or the covering provider  during after hours 7P-7A, please log into the web site [email or web address] and access using universal [place] password for that web site. If you do not have the password, please call the hospital operator.  03/02/2022, 10:23 AM

## 2022-03-02 NOTE — Progress Notes (Signed)
Patient via bed to cath lab 

## 2022-03-02 NOTE — Progress Notes (Signed)
Site area: rt groin femoral arterial and venous sheaths pulled by York Cerise, RN Site Prior to Removal:  Level 0 Pressure Applied For: 20 minutes Manual:   yes Patient Status During Pull:  stable Post Pull Site:  Level 0 Post Pull Instructions Given:  yes Post Pull Pulses Present: rt dp dopplered Dressing Applied:  gauze and tegaderm Bedrest begins @ 0160 Comments:

## 2022-03-03 ENCOUNTER — Inpatient Hospital Stay (HOSPITAL_COMMUNITY): Payer: Medicare Other

## 2022-03-03 ENCOUNTER — Other Ambulatory Visit: Payer: Self-pay | Admitting: Family Medicine

## 2022-03-03 DIAGNOSIS — I82409 Acute embolism and thrombosis of unspecified deep veins of unspecified lower extremity: Secondary | ICD-10-CM | POA: Diagnosis not present

## 2022-03-03 DIAGNOSIS — R079 Chest pain, unspecified: Secondary | ICD-10-CM | POA: Diagnosis not present

## 2022-03-03 DIAGNOSIS — I2699 Other pulmonary embolism without acute cor pulmonale: Secondary | ICD-10-CM

## 2022-03-03 LAB — CBC
HCT: 32.7 % — ABNORMAL LOW (ref 39.0–52.0)
Hemoglobin: 10.4 g/dL — ABNORMAL LOW (ref 13.0–17.0)
MCH: 31.1 pg (ref 26.0–34.0)
MCHC: 31.8 g/dL (ref 30.0–36.0)
MCV: 97.9 fL (ref 80.0–100.0)
Platelets: 243 10*3/uL (ref 150–400)
RBC: 3.34 MIL/uL — ABNORMAL LOW (ref 4.22–5.81)
RDW: 16.5 % — ABNORMAL HIGH (ref 11.5–15.5)
WBC: 7.4 10*3/uL (ref 4.0–10.5)
nRBC: 0.7 % — ABNORMAL HIGH (ref 0.0–0.2)

## 2022-03-03 LAB — BASIC METABOLIC PANEL
Anion gap: 10 (ref 5–15)
BUN: 45 mg/dL — ABNORMAL HIGH (ref 8–23)
CO2: 29 mmol/L (ref 22–32)
Calcium: 9.3 mg/dL (ref 8.9–10.3)
Chloride: 97 mmol/L — ABNORMAL LOW (ref 98–111)
Creatinine, Ser: 2.1 mg/dL — ABNORMAL HIGH (ref 0.61–1.24)
GFR, Estimated: 33 mL/min — ABNORMAL LOW (ref >60)
Glucose, Bld: 128 mg/dL — ABNORMAL HIGH (ref 70–99)
Potassium: 3.4 mmol/L — ABNORMAL LOW (ref 3.5–5.1)
Sodium: 136 mmol/L (ref 135–145)

## 2022-03-03 MED ORDER — POTASSIUM CHLORIDE CRYS ER 10 MEQ PO TBCR
10.0000 meq | EXTENDED_RELEASE_TABLET | Freq: Every day | ORAL | Status: DC
  Administered 2022-03-03 – 2022-03-06 (×4): 10 meq via ORAL
  Filled 2022-03-03 (×6): qty 1

## 2022-03-03 MED ORDER — OXYCODONE-ACETAMINOPHEN 5-325 MG PO TABS
1.0000 | ORAL_TABLET | ORAL | Status: DC | PRN
  Administered 2022-03-03 – 2022-03-05 (×6): 1 via ORAL
  Filled 2022-03-03 (×6): qty 1

## 2022-03-03 NOTE — Progress Notes (Signed)
Lower extremity venous bilateral study completed.   Please see CV Proc for preliminary results.   Anthonny Schiller, RDMS, RVT  

## 2022-03-03 NOTE — Progress Notes (Signed)
PROGRESS NOTE   Keith [name]  WPY:[id number] DOB: 01/31/1951 DOA: 02/28/2022 PCP: [name], [name], MD  Brief Narrative:   37 home dwelling black male Known saddle pulmonary embolism/eetvt [id number] complicated by significant hypotension needed Levophed--also fell and had malleoli or ankle fracture in her requiring ORIF and was discharged to CIR--- stopped anticoagulation apparently 11/2020 Prior pleural effusion 11/2020 secondary to pneumonia?  Exudative Left adrenal adenoma DM TY 2 with neuropathy +/- hypoglycemia Class II obesity HTN Chronic pain secondary to cervicalgia on opiates + depression   Office communications from 5/23 note patient was missed taking his duloxetine-taking 2 capsules instead of 1-developed blood pressures in the 90s, dizziness dry mouth nausea nation constipation-then subsequently apparently developed some pain across his chest-with radiation and SOB-worse with movement exercise exertion--?  Pleuritic component  5/25: Patient admitted-VQ scan showing probability of PE 5/26 cardiac cath Dr. Doylene Canard = tricuspid regurg 4+ in the setting of very reduced RVEF   Hospital-Problem based course  Acute PE [has had prior saddle embolus 06/2020 requiring Levophed because of hypotension] IV heparin transition to DOAC-will be lifelong Doppler ultrasound lower extremities are pending-he has right >left swelling Severe pulmonary hypertension [? WHO grp iv [Chr VTE] Severe TR as a result of pulmonary hypertension? CvC liver 2/2 hepatomegaly from right-sided symptoms Discussed with on call APP for cardiothoracic-appreciate cardiothoracic input in terms of strategy for?  TR repair versus advanced heart failure team input Cannot keep him to "dry"--- I will ask advanced heart failure team to see him DM TY 2+ neuropathy A1c 6.6 01/17/2022 CBGs ranging 1 20-1 50 Continue gabapentin 600 3 times daily-note high dose and need for dosage adjustment in the outpatient  setting HTN Continue clonidine 0.1 daily for now Because of the pulmonary hypertension physiology, would add hydralazine if blood pressures go above 353 systolic AKI on admission 2/2 PE physiology versus Tenoretic from PTA Renal function gradually improving Chronic cervicalgia degenerative disc disease and depression Patient does see pain management doctor Bartco at [facility] neurosurgery Continue Cymbalta 60 daily oxycodone 2 tabs every 6 as needed as per home regimen Patient had recent surgery November 2022 and has recovered but is requiring pain meds and may be getting nausea from this We will attempt to de-escalate meds and reassess in the a.m.  DVT prophylaxis: On heparin currently Code Status:DNAR Family Communication: No one present At bedside Long and detailed discussion with the patient at the bedside regarding need for further work-up with specialists Disposition:  Status is: Inpatient Remains inpatient appropriate because:   Needs cardiac cath May need further work-up in the outpatient setting   Consultants:  Cardiology  Procedures:   Antimicrobials:   Subjective:  Nausea yesterday and today-mild abdominal pain Also complains of neuropathic pain in lower extremities We had a good discussion this morning about his heart valves and we have spoken to cardiothoracic surgery to see the patient   Objective: Vitals:   03/02/22 2349 03/03/22 0000 03/03/22 0422 03/03/22 0815  BP: 116/75 110/70 118/80 123/79  Pulse: 65 67 68 70  Resp: '15 20 18 17  '$ Temp: 97.9 F (36.6 C)  97.6 F (36.4 C) 97.6 F (36.4 C)  TempSrc: Oral  Oral Oral  SpO2: 96% 96% 98% 96%  Weight:      Height:        Intake/Output Summary (Last 24 hours) at 03/03/2022 0938 Last data filed at 03/03/2022 0423 Gross per 24 hour  Intake 236 ml  Output 200 ml  Net 36 ml  Filed Weights   02/28/22 2338 03/01/22 0200 03/01/22 0424  Weight: 127 kg 127 kg (!) 138 kg    Examination:  EOMI NCAT no  focal deficit no wheeze Neck soft supple S1-S2 no murmur mild JVD Abdomen is obese nontender no rebound no guarding No lower extremity edema ROM is intact Neuro 5/5 power  Data Reviewed: personally reviewed   CBC    Component Value Date/Time   WBC 7.4 03/03/2022 0055   RBC 3.34 (L) 03/03/2022 0055   HGB 10.4 (L) 03/03/2022 0055   HCT 32.7 (L) 03/03/2022 0055   PLT 243 03/03/2022 0055   MCV 97.9 03/03/2022 0055   MCH 31.1 03/03/2022 0055   MCHC 31.8 03/03/2022 0055   RDW 16.5 (H) 03/03/2022 0055   LYMPHSABS 1.7 02/28/2022 2344   MONOABS 1.1 (H) 02/28/2022 2344   EOSABS 0.1 02/28/2022 2344   BASOSABS 0.0 02/28/2022 2344      Latest Ref Rng & Units 03/03/2022   12:55 AM 03/02/2022    6:50 PM 03/02/2022   12:15 PM  CMP  Glucose 70 - 99 mg/dL 128   154     BUN 8 - 23 mg/dL 45   46     Creatinine 0.61 - 1.24 mg/dL 2.10   2.12     Sodium 135 - 145 mmol/L 136   136   141     140    Potassium 3.5 - 5.1 mmol/L 3.4   4.8   3.4     3.4    Chloride 98 - 111 mmol/L 97   99     CO2 22 - 32 mmol/L 29   26     Calcium 8.9 - 10.3 mg/dL 9.3   9.1     Total Protein 6.5 - 8.1 g/dL  7.1     Total Bilirubin 0.3 - 1.2 mg/dL  1.0     Alkaline Phos 38 - 126 U/L  97     AST 15 - 41 U/L  84     ALT 0 - 44 U/L  72        Radiology Studies: NM Pulmonary Perfusion  Result Date: 03/01/2022 CLINICAL DATA:  Pulmonary embolism suspected, high probability. EXAM: NUCLEAR MEDICINE PERFUSION LUNG SCAN TECHNIQUE: Perfusion images were obtained in multiple projections after intravenous injection of radiopharmaceutical. Ventilation scans intentionally deferred if perfusion scan and chest x-ray adequate for interpretation during COVID 19 epidemic. RADIOPHARMACEUTICALS:  4.3 mCi Tc-67mMAA IV COMPARISON:  Chest radiography yesterday.  Chest CT 12/20/2020. FINDINGS: There are segmental perfusion defects in both lungs suspicious for pulmonary embolism. Likelihood is moderate probability to high probability. No  lobar or greater perfusion defect. IMPRESSION: Bilateral segmental perfusion defects likely to indicate pulmonary embolism. Likelihood is moderate probability to high probability. Electronically Signed   By: MNelson ChimesM.D.   On: 03/01/2022 15:17   CARDIAC CATHETERIZATION  Result Date: 03/02/2022   The left ventricular systolic function is normal.   LV end diastolic pressure is mildly elevated.   The left ventricular ejection fraction is 50-55% by visual estimate.   Hemodynamic findings consistent with moderate pulmonary hypertension.   There is severe (4+) tricuspid regurgitation. CVTS consult for CTEPH. Pulmonary consult for moderate pulmonary HTN/CTEPH.   ECHOCARDIOGRAM COMPLETE  Result Date: 03/01/2022    ECHOCARDIOGRAM REPORT   Patient Name:   Keith BANNDate of Exam: 03/01/2022 Medical Rec #:  [id number]       Height:  72.0 in Accession #:    [id number]       Weight:       304.2 lb Date of Birth:  [birth year]       BSA:          2.546 m Patient Age:    71 years         BP:           104/70 mmHg Patient Gender: M                HR:           66 bpm. Exam Location:  Inpatient Procedure: 2D Echo, Cardiac Doppler and Color Doppler Indications:    CHF  History:        Patient has prior history of Echocardiogram examinations, most                 recent 06/27/2020. Risk Factors:Hypertension and Diabetes.  Sonographer:    Jefferey Pica Referring Phys: [place]  Sonographer Comments: Patient is morbidly obese. Image acquisition challenging due to patient body habitus. IMPRESSIONS  1. Left ventricular ejection fraction, by estimation, is 55 to 60%. The left ventricle has normal function. The left ventricle demonstrates regional wall motion abnormalities (see scoring diagram/findings for description). There is mild concentric left ventricular hypertrophy. Left ventricular diastolic parameters are consistent with Grade I diastolic dysfunction (impaired relaxation). There is the  interventricular septum is flattened in systole and diastole, consistent with right ventricular pressure and volume overload. There is mild hypokinesis of the left ventricular, entire septal wall.  2. Right ventricular systolic function is severely reduced. The right ventricular size is severely enlarged. There is severely elevated pulmonary artery systolic pressure.  3. Left atrial size was mildly dilated.  4. Right atrial size was severely dilated.  5. The mitral valve is degenerative. Trivial mitral valve regurgitation.  6. Tricuspid valve regurgitation is moderate to severe.  7. The aortic valve is tricuspid. There is mild calcification of the aortic valve. There is mild thickening of the aortic valve. Aortic valve regurgitation is not visualized.  8. The inferior vena cava is normal in size with greater than 50% respiratory variability, suggesting right atrial pressure of 3 mmHg. FINDINGS  Left Ventricle: Left ventricular ejection fraction, by estimation, is 55 to 60%. The left ventricle has normal function. The left ventricle demonstrates regional wall motion abnormalities. Mild hypokinesis of the left ventricular, entire septal wall. The left ventricular internal cavity size was normal in size. There is mild concentric left ventricular hypertrophy. The interventricular septum is flattened in systole and diastole, consistent with right ventricular pressure and volume overload. Left ventricular diastolic parameters are consistent with Grade I diastolic dysfunction (impaired relaxation). Right Ventricle: The right ventricular size is severely enlarged. Decreased right ventricular wall thickness. Right ventricular systolic function is severely reduced. There is severely elevated pulmonary artery systolic pressure. The tricuspid regurgitant velocity is 3.87 m/s, and with an assumed right atrial pressure of 15 mmHg, the estimated right ventricular systolic pressure is 26.3 mmHg. Left Atrium: Left atrial size was  mildly dilated. Right Atrium: Right atrial size was severely dilated. Pericardium: There is no evidence of pericardial effusion. Mitral Valve: The mitral valve is degenerative in appearance. Trivial mitral valve regurgitation. Tricuspid Valve: The tricuspid valve is normal in structure. Tricuspid valve regurgitation is moderate to severe. Aortic Valve: The aortic valve is tricuspid. There is mild calcification of the aortic valve. There is mild thickening of the aortic valve. There  is mild aortic valve annular calcification. Aortic valve regurgitation is not visualized. Aortic valve peak gradient measures 2.6 mmHg. Pulmonic Valve: The pulmonic valve was normal in structure. Pulmonic valve regurgitation is mild. Aorta: The aortic root is normal in size and structure. There is minimal (Grade I) atheroma plaque involving the aortic root and ascending aorta. Venous: The inferior vena cava is normal in size with greater than 50% respiratory variability, suggesting right atrial pressure of 3 mmHg. IAS/Shunts: The atrial septum is grossly normal.  LEFT VENTRICLE PLAX 2D LVIDd:         3.60 cm LVIDs:         2.40 cm LV PW:         1.30 cm LV IVS:        1.30 cm LVOT diam:     2.20 cm LVOT Area:     3.80 cm  IVC IVC diam: 3.10 cm LEFT ATRIUM             Index        RIGHT ATRIUM           Index LA diam:        3.40 cm 1.34 cm/m   RA Area:     26.00 cm LA Vol (A2C):   54.0 ml 21.21 ml/m  RA Volume:   101.00 ml 39.66 ml/m LA Vol (A4C):   64.8 ml 25.45 ml/m LA Biplane Vol: 61.9 ml 24.31 ml/m  AORTIC VALVE             PULMONIC VALVE AV Vmax:      80.90 cm/s PV Vmax:       0.39 m/s AV Peak Grad: 2.6 mmHg   PV Peak grad:  0.6 mmHg  AORTA Ao Root diam: 3.90 cm Ao Asc diam:  3.70 cm MITRAL VALVE               TRICUSPID VALVE MV Area (PHT): 3.46 cm    TR Peak grad:   59.9 mmHg MV Decel Time: 220 msec    TR Vmax:        387.00 cm/s MV E velocity: 41.10 cm/s MV A velocity: 63.20 cm/s  SHUNTS MV E/A ratio:  0.65        Systemic  Diam: 2.20 cm Dixie Dials MD Electronically signed by Dixie Dials MD Signature Date/Time: 03/01/2022/4:41:24 PM    Final      Scheduled Meds:  apixaban  10 mg Oral BID   Followed by   Derrill Memo ON 03/09/2022] apixaban  5 mg Oral BID   cloNIDine  0.1 mg Oral Daily   DULoxetine  60 mg Oral Daily   gabapentin  300 mg Oral BID   levETIRAcetam  250 mg Oral BID   mouth rinse  15 mL Mouth Rinse BID   multivitamin with minerals  1 tablet Oral Q breakfast   sodium chloride flush  3 mL Intravenous Q12H   sodium chloride flush  3 mL Intravenous Q12H   Continuous Infusions:  sodium chloride     diltiazem (CARDIZEM) infusion Stopped (03/02/22 1314)     LOS: 2 days   Time spent: [place], MD [facility] To contact the attending provider between 7A-7P or the covering provider during after hours 7P-7A, please log into the web site [email or web address] and access using universal [place] password for that web site. If you do not have the password, please call the hospital operator.  03/03/2022, 9:38 AM

## 2022-03-03 NOTE — Consult Note (Signed)
Ref: [name], [name], MD   Subjective:  Awake. VS stable. Near normal coronaries. Moderate MR and moderate to severe TR. Patient aware of moderate pulmonary HTN and dilated RV from chronic PE. Discussed care with CVTS for possible surgical correction of chronic PE +/- tricuspid regurgitation with TV repair. Creatinine and Right groin stable. Will resume chronic anticoagulation if not a surgical candidate.  Objective:  Vital Signs in the last 24 hours: Temp:  [97.6 F (36.4 C)-98.1 F (36.7 C)] 97.6 F (36.4 C) (05/27 0815) Pulse Rate:  [0-74] 70 (05/27 0815) Cardiac Rhythm: Normal sinus rhythm (05/27 0700) Resp:  [10-26] 17 (05/27 0815) BP: (95-136)/(69-91) 123/79 (05/27 0815) SpO2:  [88 %-99 %] 96 % (05/27 0815)  Physical Exam: BP Readings from Last 1 Encounters:  03/03/22 123/79     Wt Readings from Last 1 Encounters:  03/01/22 (!) 138 kg    Weight change:  Body mass index is 41.26 kg/m. HEENT: [place]/AT, Eyes-Brown, Conjunctiva-Pink, Sclera-Non-icteric Neck: No JVD, No bruit, Trachea midline. Lungs:  Clearing, Bilateral. Cardiac:  Regular rhythm, normal S1 and S2, no S3. II/VI systolic murmur. Abdomen:  Soft, non-tender. BS present. Extremities:  2 + edema present. No cyanosis. No clubbing. CNS: AxOx3, Cranial nerves grossly intact, moves all 4 extremities.  Skin: Warm and dry.   Intake/Output from previous day: 05/26 0701 - 05/27 0700 In: 236 [P.O.:236] Out: 850 [Urine:850]    Lab Results: BMET    Component Value Date/Time   NA 136 03/03/2022 0055   NA 136 03/02/2022 1850   NA 141 03/02/2022 1215   NA 140 03/02/2022 1215   K 3.4 (L) 03/03/2022 0055   K 4.8 03/02/2022 1850   K 3.4 (L) 03/02/2022 1215   K 3.4 (L) 03/02/2022 1215   CL 97 (L) 03/03/2022 0055   CL 99 03/02/2022 1850   CL 100 03/02/2022 0054   CO2 29 03/03/2022 0055   CO2 26 03/02/2022 1850   CO2 27 03/02/2022 0054   GLUCOSE 128 (H) 03/03/2022 0055   GLUCOSE 154 (H) 03/02/2022 1850    GLUCOSE 131 (H) 03/02/2022 0054   GLUCOSE 100 (H) 09/09/2006 0956   BUN 45 (H) 03/03/2022 0055   BUN 46 (H) 03/02/2022 1850   BUN 52 (H) 03/02/2022 0054   CREATININE 2.10 (H) 03/03/2022 0055   CREATININE 2.12 (H) 03/02/2022 1850   CREATININE 2.41 (H) 03/02/2022 0054   CALCIUM 9.3 03/03/2022 0055   CALCIUM 9.1 03/02/2022 1850   CALCIUM 9.1 03/02/2022 0054   GFRNONAA 33 (L) 03/03/2022 0055   GFRNONAA 33 (L) 03/02/2022 1850   GFRNONAA 28 (L) 03/02/2022 0054   GFRAA >60 07/11/2020 0619   GFRAA >60 07/05/2020 0456   GFRAA >60 07/03/2020 0640   CBC    Component Value Date/Time   WBC 7.4 03/03/2022 0055   RBC 3.34 (L) 03/03/2022 0055   HGB 10.4 (L) 03/03/2022 0055   HCT 32.7 (L) 03/03/2022 0055   PLT 243 03/03/2022 0055   MCV 97.9 03/03/2022 0055   MCH 31.1 03/03/2022 0055   MCHC 31.8 03/03/2022 0055   RDW 16.5 (H) 03/03/2022 0055   LYMPHSABS 1.7 02/28/2022 2344   MONOABS 1.1 (H) 02/28/2022 2344   EOSABS 0.1 02/28/2022 2344   BASOSABS 0.0 02/28/2022 2344   HEPATIC Function Panel Recent Labs    02/28/22 2344 03/01/22 0711 03/02/22 1850  PROT 7.9 7.7 7.1   HEMOGLOBIN A1C No components found for: HGA1C,  MPG CARDIAC ENZYMES No results found for: CKTOTAL, CKMB,  CKMBINDEX, TROPONINI BNP No results for input(s): PROBNP in the last 8760 hours. TSH No results for input(s): TSH in the last 8760 hours. CHOLESTEROL Recent Labs    01/17/22 1140  CHOL 140    Scheduled Meds:  apixaban  10 mg Oral BID   Followed by   Derrill Memo ON 03/09/2022] apixaban  5 mg Oral BID   cloNIDine  0.1 mg Oral Daily   DULoxetine  60 mg Oral Daily   gabapentin  300 mg Oral BID   levETIRAcetam  250 mg Oral BID   mouth rinse  15 mL Mouth Rinse BID   multivitamin with minerals  1 tablet Oral Q breakfast   sodium chloride flush  3 mL Intravenous Q12H   sodium chloride flush  3 mL Intravenous Q12H   Continuous Infusions:  sodium chloride     diltiazem (CARDIZEM) infusion Stopped (03/02/22 1314)    PRN Meds:.sodium chloride, acetaminophen, ondansetron (ZOFRAN) IV, oxyCODONE-acetaminophen, sodium chloride flush  Assessment/Plan:  Non-cardiac chest pain Acute diastolic left heart failure Chronic RV failure Moderate pulmonary HTN from Chronic thromboembolism Moderate to severe TR from RV dilatation HTN HLD Morbid obesity Type 2 DM Acute renal failure, stabilizing.  Plan: CVTS consult. Potassium replacement.   LOS: 2 days   Time spent including chart review, lab review, examination, discussion with patient/Nurse/Referral  : 30 min   Dixie Dials  MD  03/03/2022, 10:31 AM

## 2022-03-03 NOTE — Plan of Care (Signed)
Discussed with patient in the beginning of the shift plan of care for the evening, pain management and mobility with some teach back displayed.  Problem: Education: Goal: Knowledge of General Education information will improve Description: Including pain rating scale, medication(s)/side effects and non-pharmacologic comfort measures Outcome: Progressing   Problem: Health Behavior/Discharge Planning: Goal: Ability to manage health-related needs will improve Outcome: Progressing

## 2022-03-04 DIAGNOSIS — I361 Nonrheumatic tricuspid (valve) insufficiency: Secondary | ICD-10-CM | POA: Diagnosis not present

## 2022-03-04 DIAGNOSIS — R079 Chest pain, unspecified: Secondary | ICD-10-CM | POA: Diagnosis not present

## 2022-03-04 LAB — COMPREHENSIVE METABOLIC PANEL
ALT: 55 U/L — ABNORMAL HIGH (ref 0–44)
AST: 30 U/L (ref 15–41)
Albumin: 3.5 g/dL (ref 3.5–5.0)
Alkaline Phosphatase: 106 U/L (ref 38–126)
Anion gap: 9 (ref 5–15)
BUN: 30 mg/dL — ABNORMAL HIGH (ref 8–23)
CO2: 29 mmol/L (ref 22–32)
Calcium: 9.7 mg/dL (ref 8.9–10.3)
Chloride: 101 mmol/L (ref 98–111)
Creatinine, Ser: 1.47 mg/dL — ABNORMAL HIGH (ref 0.61–1.24)
GFR, Estimated: 51 mL/min — ABNORMAL LOW (ref >60)
Glucose, Bld: 136 mg/dL — ABNORMAL HIGH (ref 70–99)
Potassium: 3.6 mmol/L (ref 3.5–5.1)
Sodium: 139 mmol/L (ref 135–145)
Total Bilirubin: 0.4 mg/dL (ref 0.3–1.2)
Total Protein: 7.6 g/dL (ref 6.5–8.1)

## 2022-03-04 LAB — CBC WITH DIFFERENTIAL/PLATELET
Abs Immature Granulocytes: 0.01 10*3/uL (ref 0.00–0.07)
Basophils Absolute: 0 10*3/uL (ref 0.0–0.1)
Basophils Relative: 0 %
Eosinophils Absolute: 0.1 10*3/uL (ref 0.0–0.5)
Eosinophils Relative: 3 %
HCT: 33 % — ABNORMAL LOW (ref 39.0–52.0)
Hemoglobin: 10.6 g/dL — ABNORMAL LOW (ref 13.0–17.0)
Immature Granulocytes: 0 %
Lymphocytes Relative: 18 %
Lymphs Abs: 0.9 10*3/uL (ref 0.7–4.0)
MCH: 31.4 pg (ref 26.0–34.0)
MCHC: 32.1 g/dL (ref 30.0–36.0)
MCV: 97.6 fL (ref 80.0–100.0)
Monocytes Absolute: 1 10*3/uL (ref 0.1–1.0)
Monocytes Relative: 20 %
Neutro Abs: 3 10*3/uL (ref 1.7–7.7)
Neutrophils Relative %: 59 %
Platelets: 223 10*3/uL (ref 150–400)
RBC: 3.38 MIL/uL — ABNORMAL LOW (ref 4.22–5.81)
RDW: 16.7 % — ABNORMAL HIGH (ref 11.5–15.5)
WBC: 5.1 10*3/uL (ref 4.0–10.5)
nRBC: 0.4 % — ABNORMAL HIGH (ref 0.0–0.2)

## 2022-03-04 MED ORDER — WITCH HAZEL-GLYCERIN EX PADS: MEDICATED_PAD | CUTANEOUS | Status: DC | PRN

## 2022-03-04 MED ORDER — HYDROCORTISONE ACETATE 25 MG RE SUPP
25.0000 mg | Freq: Two times a day (BID) | RECTAL | Status: DC
  Filled 2022-03-04 (×2): qty 1

## 2022-03-04 MED ORDER — TORSEMIDE 20 MG PO TABS
20.0000 mg | ORAL_TABLET | ORAL | Status: DC
  Administered 2022-03-05: 20 mg via ORAL
  Filled 2022-03-04: qty 1

## 2022-03-04 MED ORDER — FUROSEMIDE 10 MG/ML IJ SOLN
40.0000 mg | Freq: Once | INTRAMUSCULAR | Status: AC
  Administered 2022-03-04: 40 mg via INTRAVENOUS
  Filled 2022-03-04: qty 4

## 2022-03-04 NOTE — Consult Note (Signed)
[address]       [place] [address]             [phone]                    [name] Medical Record #[id number] Date of Birth: Sep 30, 1951  Referring: No ref. provider found Primary Care: [name], [name], MD Primary Cardiologist: None  Chief Complaint:    Chief Complaint  Patient presents with   Chest Pain   Weakness    History of Present Illness:    Keith [name] 71 y.o. male admitted with chest pain.  He has a history diabetes mellitus, hypertension, and hyperlipidemia.  He has also had several pulmonary embolisms in the past.  He underwent an echocardiogram which showed severe tricuspid valve regurgitation, severely reduced right ventricular function, and elevated pulmonary pressures.  His left heart catheterization showed no significant coronary artery disease.  His right heart catheterization was consistent with moderate pulmonary hypertension.  CTS has been consulted to assist with management.    Past Medical History:  Diagnosis Date   Allergy    Chronic back pain    Diabetes mellitus without complication ([place])    DNR (do not resuscitate) 11/19/2020   Duodenal ulcer hemorrhage 08/29/2014   ED (erectile dysfunction)    Esophageal stricture 08/30/2014   Glucose intolerance (impaired glucose tolerance)    Hiatal hernia 08/30/2014   Hypertension    Hypokalemia 04/11/2013   MRSA carrier 08/30/2014   Obesity    Osteoarthritis    Pneumonia    Pulmonary embolism ([place]) 06/2020   Scoliosis 2016   Spinal stenosis of lumbar region    Urinary tract infection 04/11/2013    Past Surgical History:  Procedure Laterality Date   BACK SURGERY  08/07/2021   COLONOSCOPY  2008   ESOPHAGOGASTRODUODENOSCOPY N/A 08/29/2014   Procedure: ESOPHAGOGASTRODUODENOSCOPY (EGD);  Surgeon: Lafayette Dragon, MD;  Location: [facility];  Service: Endoscopy;  Laterality: N/A;   LAMINECTOMY N/A 07/28/2018   Procedure: THORACIC ELEVEN- THORACIC TWELVE  POSTERIOR DECOMPRESSION LAMINECTOMY;  Surgeon: Judith Part, MD;  Location: [facility];  Service: Neurosurgery;  Laterality: N/A;   LUMBAR LAMINECTOMY     NASAL POLYP SURGERY     ORIF ANKLE FRACTURE Right 07/01/2020   Procedure: OPEN REDUCTION INTERNAL FIXATION (ORIF) ANKLE FRACTURE. TRIMALLEOLAR;  Surgeon: Marchia Bond, MD;  Location: [facility];  Service: Orthopedics;  Laterality: Right;   RIGHT/LEFT HEART CATH AND CORONARY ANGIOGRAPHY N/A 03/02/2022   Procedure: RIGHT/LEFT HEART CATH AND CORONARY ANGIOGRAPHY;  Surgeon: Dixie Dials, MD;  Location: [facility];  Service: Cardiovascular;  Laterality: N/A;   TONSILLECTOMY      Family History  Problem Relation Age of Onset   Diabetes Mother    Asthma Mother    Hypertension Father    Stroke Father    Colon cancer Neg Hx      Social History   Tobacco Use  Smoking Status Former   Packs/day: 1.00   Years: 25.00   Pack years: 25.00   Types: Cigarettes   Quit date: 1995   Years since quitting: 28.4  Smokeless Tobacco Never    Social History   Substance and Sexual Activity  Alcohol Use Not Currently   Comment: holidays and special occasions     Allergies  Allergen Reactions   Aspirin Other (See Comments)    Irritates the stomach and the patient developed ulcers, also  Lisinopril Other (See Comments)    Caused a body ache    Current Facility-Administered Medications  Medication Dose Route Frequency Provider Last Rate Last Admin   0.9 %  sodium chloride infusion  250 mL Intravenous PRN Nita Sells, MD       acetaminophen (TYLENOL) tablet 650 mg  650 mg Oral Q4H PRN Nita Sells, MD       apixaban (ELIQUIS) tablet 10 mg  10 mg Oral BID Nita Sells, MD   10 mg at 03/04/22 1062   Followed by   Derrill Memo ON 03/09/2022] apixaban (ELIQUIS) tablet 5 mg  5 mg Oral BID Nita Sells, MD       cloNIDine (CATAPRES) tablet 0.1 mg  0.1 mg Oral Daily Nita Sells, MD   0.1 mg at 03/04/22 0948    DULoxetine (CYMBALTA) DR capsule 60 mg  60 mg Oral Daily Nita Sells, MD   60 mg at 03/04/22 0948   gabapentin (NEURONTIN) capsule 300 mg  300 mg Oral BID Nita Sells, MD   300 mg at 03/04/22 0948   levETIRAcetam (KEPPRA) tablet 250 mg  250 mg Oral BID Nita Sells, MD   250 mg at 03/04/22 6948   MEDLINE mouth rinse  15 mL Mouth Rinse BID Nita Sells, MD   15 mL at 03/04/22 1052   multivitamin with minerals tablet 1 tablet  1 tablet Oral Q breakfast Nita Sells, MD   1 tablet at 03/04/22 0948   ondansetron (ZOFRAN) injection 4 mg  4 mg Intravenous Q6H PRN Nita Sells, MD       oxyCODONE-acetaminophen (PERCOCET/ROXICET) 5-325 MG per tablet 1 tablet  1 tablet Oral Q4H PRN Nita Sells, MD   1 tablet at 03/04/22 0948   potassium chloride (KLOR-CON M) CR tablet 10 mEq  10 mEq Oral Daily Dixie Dials, MD   10 mEq at 03/04/22 0947   sodium chloride flush (NS) 0.9 % injection 3 mL  3 mL Intravenous Q12H Nita Sells, MD   3 mL at 03/04/22 1051   sodium chloride flush (NS) 0.9 % injection 3 mL  3 mL Intravenous Q12H Nita Sells, MD   3 mL at 03/04/22 1051   sodium chloride flush (NS) 0.9 % injection 3 mL  3 mL Intravenous PRN Nita Sells, MD          PHYSICAL EXAMINATION: BP 132/88 (BP Location: Left Arm)   Pulse 73   Temp (!) 97.4 F (36.3 C) (Oral)   Resp 18   Ht 6' (1.829 m)   Wt (!) 138 kg   SpO2 95%   BMI 41.26 kg/m   Physical Exam Constitutional:      Appearance: He is obese. He is ill-appearing.  Cardiovascular:     Heart sounds: Murmur heard.  Pulmonary:     Effort: Pulmonary effort is normal.  Skin:    General: Skin is warm and dry.  Neurological:     Mental Status: He is alert.     Diagnostic Studies & Laboratory data:     Recent Radiology Findings:   DG Chest 2 View  Result Date: 02/28/2022 CLINICAL DATA:  Chest pain and weakness. EXAM: CHEST - 2 VIEW COMPARISON:  September 19, 2021 FINDINGS: There is stable mild to moderate severity enlargement of the cardiac silhouette. There is marked severity tortuosity of the descending thoracic aorta. Both lungs are clear. Thin, curvilinear wire like material is seen overlying the lateral aspect of the mid left chest wall on the frontal view. There  is mild to moderate severity dextroscoliosis of the mid to lower thoracic spine with multilevel degenerative changes. Postoperative changes are noted within the visualized portion of the upper lumbar spine. IMPRESSION: 1. Stable cardiomegaly with marked severity tortuosity of the descending thoracic aorta. 2. No acute or active cardiopulmonary disease. Electronically Signed   By: Virgina Norfolk M.D.   On: 02/28/2022 23:56   NM Pulmonary Perfusion  Result Date: 03/01/2022 CLINICAL DATA:  Pulmonary embolism suspected, high probability. EXAM: NUCLEAR MEDICINE PERFUSION LUNG SCAN TECHNIQUE: Perfusion images were obtained in multiple projections after intravenous injection of radiopharmaceutical. Ventilation scans intentionally deferred if perfusion scan and chest x-ray adequate for interpretation during COVID 19 epidemic. RADIOPHARMACEUTICALS:  4.3 mCi Tc-18mMAA IV COMPARISON:  Chest radiography yesterday.  Chest CT 12/20/2020. FINDINGS: There are segmental perfusion defects in both lungs suspicious for pulmonary embolism. Likelihood is moderate probability to high probability. No lobar or greater perfusion defect. IMPRESSION: Bilateral segmental perfusion defects likely to indicate pulmonary embolism. Likelihood is moderate probability to high probability. Electronically Signed   By: MNelson ChimesM.D.   On: 03/01/2022 15:17   [place]domen Complete  Result Date: 03/01/2022 CLINICAL DATA:  Acute kidney injury.  Elevated liver enzymes. EXAM: ABDOMEN ULTRASOUND COMPLETE COMPARISON:  None Available. FINDINGS: Gallbladder: No gallstones or wall thickening visualized. No sonographic Murphy sign noted by  sonographer. Common bile duct: Diameter: 5-6 mm Liver: Coarsening of hepatic echotexture suggests fatty deposition. No focal intrahepatic parenchymal abnormality evident. Portal vein is patent on color Doppler imaging with normal direction of blood flow towards the liver. IVC: No abnormality visualized. Pancreas: Visualized portion unremarkable. Spleen: Size and appearance within normal limits. Right Kidney: Length: 10.1 cm. Echogenicity within normal limits. No mass or hydronephrosis visualized. Left Kidney: Length: 12.2 cm . Echogenicity within normal limits. No mass or hydronephrosis visualized.17 mm simple cyst evident similar to CT scan from 06/26/2020. Abdominal aorta: Obscured by overlying bowel gas. Other findings: No evidence for intraperitoneal free fluid. IMPRESSION: 1. No acute findings. There is a small cyst in the left kidney, similar to CT scan from 06/26/2020. 2. Coarsening of hepatic echotexture suggests fatty deposition. Electronically Signed   By: EMisty StanleyM.D.   On: 03/01/2022 07:50   CARDIAC CATHETERIZATION  Result Date: 03/02/2022   The left ventricular systolic function is normal.   LV end diastolic pressure is mildly elevated.   The left ventricular ejection fraction is 50-55% by visual estimate.   Hemodynamic findings consistent with moderate pulmonary hypertension.   There is severe (4+) tricuspid regurgitation. CVTS consult for CTEPH. Pulmonary consult for moderate pulmonary HTN/CTEPH.   ECHOCARDIOGRAM COMPLETE  Result Date: 03/01/2022    ECHOCARDIOGRAM REPORT   Patient Name:   ATENG DECOUDate of Exam: 03/01/2022 Medical Rec #:  [id number]       Height:       72.0 in Accession #:    [id number]      Weight:       304.2 lb Date of Birth:  [birth year]      BSA:          2.546 m Patient Age:    67years         BP:           104/70 mmHg Patient Gender: M                HR:           66 bpm. Exam Location:  Inpatient  Procedure: 2D Echo, Cardiac Doppler and Color Doppler  Indications:    CHF  History:        Patient has prior history of Echocardiogram examinations, most                 recent 06/27/2020. Risk Factors:Hypertension and Diabetes.  Sonographer:    Jefferey Pica Referring Phys: [place]  Sonographer Comments: Patient is morbidly obese. Image acquisition challenging due to patient body habitus. IMPRESSIONS  1. Left ventricular ejection fraction, by estimation, is 55 to 60%. The left ventricle has normal function. The left ventricle demonstrates regional wall motion abnormalities (see scoring diagram/findings for description). There is mild concentric left ventricular hypertrophy. Left ventricular diastolic parameters are consistent with Grade I diastolic dysfunction (impaired relaxation). There is the interventricular septum is flattened in systole and diastole, consistent with right ventricular pressure and volume overload. There is mild hypokinesis of the left ventricular, entire septal wall.  2. Right ventricular systolic function is severely reduced. The right ventricular size is severely enlarged. There is severely elevated pulmonary artery systolic pressure.  3. Left atrial size was mildly dilated.  4. Right atrial size was severely dilated.  5. The mitral valve is degenerative. Trivial mitral valve regurgitation.  6. Tricuspid valve regurgitation is moderate to severe.  7. The aortic valve is tricuspid. There is mild calcification of the aortic valve. There is mild thickening of the aortic valve. Aortic valve regurgitation is not visualized.  8. The inferior vena cava is normal in size with greater than 50% respiratory variability, suggesting right atrial pressure of 3 mmHg. FINDINGS  Left Ventricle: Left ventricular ejection fraction, by estimation, is 55 to 60%. The left ventricle has normal function. The left ventricle demonstrates regional wall motion abnormalities. Mild hypokinesis of the left ventricular, entire septal wall. The left ventricular  internal cavity size was normal in size. There is mild concentric left ventricular hypertrophy. The interventricular septum is flattened in systole and diastole, consistent with right ventricular pressure and volume overload. Left ventricular diastolic parameters are consistent with Grade I diastolic dysfunction (impaired relaxation). Right Ventricle: The right ventricular size is severely enlarged. Decreased right ventricular wall thickness. Right ventricular systolic function is severely reduced. There is severely elevated pulmonary artery systolic pressure. The tricuspid regurgitant velocity is 3.87 m/s, and with an assumed right atrial pressure of 15 mmHg, the estimated right ventricular systolic pressure is 76.5 mmHg. Left Atrium: Left atrial size was mildly dilated. Right Atrium: Right atrial size was severely dilated. Pericardium: There is no evidence of pericardial effusion. Mitral Valve: The mitral valve is degenerative in appearance. Trivial mitral valve regurgitation. Tricuspid Valve: The tricuspid valve is normal in structure. Tricuspid valve regurgitation is moderate to severe. Aortic Valve: The aortic valve is tricuspid. There is mild calcification of the aortic valve. There is mild thickening of the aortic valve. There is mild aortic valve annular calcification. Aortic valve regurgitation is not visualized. Aortic valve peak gradient measures 2.6 mmHg. Pulmonic Valve: The pulmonic valve was normal in structure. Pulmonic valve regurgitation is mild. Aorta: The aortic root is normal in size and structure. There is minimal (Grade I) atheroma plaque involving the aortic root and ascending aorta. Venous: The inferior vena cava is normal in size with greater than 50% respiratory variability, suggesting right atrial pressure of 3 mmHg. IAS/Shunts: The atrial septum is grossly normal.  LEFT VENTRICLE PLAX 2D LVIDd:         3.60 cm LVIDs:  2.40 cm LV PW:         1.30 cm LV IVS:        1.30 cm LVOT diam:      2.20 cm LVOT Area:     3.80 cm  IVC IVC diam: 3.10 cm LEFT ATRIUM             Index        RIGHT ATRIUM           Index LA diam:        3.40 cm 1.34 cm/m   RA Area:     26.00 cm LA Vol (A2C):   54.0 ml 21.21 ml/m  RA Volume:   101.00 ml 39.66 ml/m LA Vol (A4C):   64.8 ml 25.45 ml/m LA Biplane Vol: 61.9 ml 24.31 ml/m  AORTIC VALVE             PULMONIC VALVE AV Vmax:      80.90 cm/s PV Vmax:       0.39 m/s AV Peak Grad: 2.6 mmHg   PV Peak grad:  0.6 mmHg  AORTA Ao Root diam: 3.90 cm Ao Asc diam:  3.70 cm MITRAL VALVE               TRICUSPID VALVE MV Area (PHT): 3.46 cm    TR Peak grad:   59.9 mmHg MV Decel Time: 220 msec    TR Vmax:        387.00 cm/s MV E velocity: 41.10 cm/s MV A velocity: 63.20 cm/s  SHUNTS MV E/A ratio:  0.65        Systemic Diam: 2.20 cm Dixie Dials MD Electronically signed by Dixie Dials MD Signature Date/Time: 03/01/2022/4:41:24 PM    Final    VAS [place] LOWER EXTREMITY VENOUS (DVT)  Result Date: 03/03/2022  Lower Venous DVT Study Patient Name:  LEON MONTOYA  Date of Exam:   03/03/2022 Medical Rec #: [id number]         Accession #:    [id number] Date of Birth: Jul 31, 1951        Patient Gender: M Patient Age:   89 years Exam Location:  [facility] Procedure:      VAS [place] LOWER EXTREMITY VENOUS (DVT) Referring Phys: Nita Sells --------------------------------------------------------------------------------  Indications: Pulmonary embolism, history of DVT/PE.  Anticoagulation: Heparin. Limitations: Body habitus and poor ultrasound/tissue interface. Comparison Study: 06-27-2020 Prior lower extremity venous bilateral was positive                   for LT popliteal, posterior tibial, and peroneal vein DVT. Performing Technologist: Darlin Coco RDMS, RVT  Examination Guidelines: A complete evaluation includes B-mode imaging, spectral Doppler, color Doppler, and power Doppler as needed of all accessible portions of each vessel. Bilateral testing is considered an integral  part of a complete examination. Limited examinations for reoccurring indications may be performed as noted. The reflux portion of the exam is performed with the patient in reverse Trendelenburg.  +---------+---------------+---------+-----------+----------+--------------+ RIGHT    CompressibilityPhasicitySpontaneityPropertiesThrombus Aging +---------+---------------+---------+-----------+----------+--------------+ CFV      Full           No       Yes                                 +---------+---------------+---------+-----------+----------+--------------+ SFJ      Full                                                        +---------+---------------+---------+-----------+----------+--------------+  FV Prox  Full                                                        +---------+---------------+---------+-----------+----------+--------------+ FV Mid   Full                                                        +---------+---------------+---------+-----------+----------+--------------+ FV DistalFull                                                        +---------+---------------+---------+-----------+----------+--------------+ PFV      Full                                                        +---------+---------------+---------+-----------+----------+--------------+ POP      Full           No       Yes                                 +---------+---------------+---------+-----------+----------+--------------+ PTV      Full                                                        +---------+---------------+---------+-----------+----------+--------------+ PERO     Full                                                        +---------+---------------+---------+-----------+----------+--------------+   +---------+---------------+---------+-----------+----------+-------------------+ LEFT     CompressibilityPhasicitySpontaneityPropertiesThrombus Aging       +---------+---------------+---------+-----------+----------+-------------------+ CFV      Full           No       Yes                                      +---------+---------------+---------+-----------+----------+-------------------+ SFJ      Full                                                             +---------+---------------+---------+-----------+----------+-------------------+ FV Prox  Full                                                             +---------+---------------+---------+-----------+----------+-------------------+  FV Mid   Full                                                             +---------+---------------+---------+-----------+----------+-------------------+ FV DistalFull                                                             +---------+---------------+---------+-----------+----------+-------------------+ PFV      Full                                                             +---------+---------------+---------+-----------+----------+-------------------+ POP      Full           No       Yes                                      +---------+---------------+---------+-----------+----------+-------------------+ PTV      Full                                         Some segments not                                                         well visualized     +---------+---------------+---------+-----------+----------+-------------------+ PERO     Full                                         Some segments not                                                         well visualized     +---------+---------------+---------+-----------+----------+-------------------+    Summary: RIGHT: - There is no evidence of deep vein thrombosis in the lower extremity.  - No cystic structure found in the popliteal fossa.  LEFT: - There is no evidence of deep vein thrombosis in the lower extremity. However, portions of  this examination were limited- see technologist comments above.  - No cystic structure found in the popliteal fossa.  *See table(s) above for measurements and observations.    Preliminary        I have independently reviewed the above radiology studies  and reviewed the findings with the patient.   Recent Lab Findings: Lab Results  Component Value Date   WBC 5.1  03/04/2022   HGB 10.6 (L) 03/04/2022   HCT 33.0 (L) 03/04/2022   PLT 223 03/04/2022   GLUCOSE 136 (H) 03/04/2022   CHOL 140 01/17/2022   TRIG 81.0 01/17/2022   HDL 50.80 01/17/2022   LDLCALC 73 01/17/2022   ALT 55 (H) 03/04/2022   AST 30 03/04/2022   NA 139 03/04/2022   K 3.6 03/04/2022   CL 101 03/04/2022   CREATININE 1.47 (H) 03/04/2022   BUN 30 (H) 03/04/2022   CO2 29 03/04/2022   TSH 0.80 11/11/2017   INR 1.2 08/08/2021   HGBA1C 6.6 (H) 01/17/2022       Assessment / Plan:   71 year old male with a right-sided heart failure likely due to chronic thromboembolisms along with severe tricuspid valve regurgitation and elevated pulmonary pressures.  Based on his right heart function he would be extremely high risk for a pulmonary embolectomy.  Additionally we do not do this procedure here as he would need to be transferred to an academic hospital for further evaluation and consideration.  Would recommend optimization of his pulmonary hypertension in the interim.      Lajuana Matte 03/04/2022 11:19 AM

## 2022-03-04 NOTE — Plan of Care (Signed)
Discussed with patient plan of care for the evening, pain management and possible transfer to [facility] with some teach back displayed.    Problem: Education: Goal: Knowledge of General Education information will improve Description: Including pain rating scale, medication(s)/side effects and non-pharmacologic comfort measures Outcome: Progressing   Problem: Health Behavior/Discharge Planning: Goal: Ability to manage health-related needs will improve Outcome: Progressing

## 2022-03-04 NOTE — Progress Notes (Addendum)
PROGRESS NOTE   Keith [name]  GBT:[id number] DOB: April 09, 1951 DOA: 02/28/2022 PCP: [name], [name], MD  Brief Narrative:   57 home dwelling black male Known saddle pulmonary embolism/eetvt 04/1061-[id number] complicated by significant hypotension needed Levophed--also fell and had malleoli or ankle fracture in her requiring ORIF and was discharged to CIR--- stopped anticoagulation apparently 11/2020 Prior pleural effusion 11/2020 secondary to pneumonia?  Exudative Left adrenal adenoma DM TY 2 with neuropathy +/- hypoglycemia Class II obesity HTN Chronic pain secondary to cervicalgia on opiates + depression   Office communications from 5/23 note patient was missed taking his duloxetine-taking 2 capsules instead of 1-developed blood pressures in the 90s, dizziness dry mouth nausea nation constipation-then subsequently apparently developed some pain across his chest-with radiation and SOB-worse with movement exercise exertion--?  Pleuritic component  5/25: Patient admitted-VQ scan showing probability of PE 5/26 cardiac cath Dr. Doylene [name] = tricuspid regurg 4+ in the setting of very reduced RVEF 5/27: US dvt study neg 5/28: NS consulted--OP follow up ? [facility] rec and not candidate here for Mech thrombectomy   Hospital-Problem based course  Acute PE [has had prior saddle embolus 06/2020 requiring Levophed because of hypotension] On DOAC-will be lifelong Doppler ultrasound lower extremities are pending-he has right >left swelling Severe pulmonary hypertension [? WHO grp iv [Chr VTE] Severe TR as a result of pulmonary hypertension? CvC liver 2/2 hepatomegaly from right-sided symptoms Appreciate Dr. Kipp [name] input--? Keith [name] for mech thrombolysis as OP [eventually] Cannot keep him too "dry" DM TY 2+ neuropathy A1c 6.6 01/17/2022 CBGs ranging 1 20-1 50 Continue gabapentin 600 3 times daily-note high dose and need for dosage adjustment in the outpatient setting HTN Continue clonidine  0.1 daily for now Because of the pulmonary hypertension physiology, would add hydralazine if blood pressures go above 381 systolic AKI on admission 2/2 PE physiology versus Tenoretic from PTA Renal function gradually improving Chronic cervicalgia degenerative disc disease and depression Patient does see pain management doctor Keith [name] at [place] [facility] Continue Cymbalta 60 daily oxycodone 2 tabs every 6 as needed as per home regimen Patient had recent surgery November 2022    DVT prophylaxis: On heparin currently Code Status: DNAR Family Communication: No one present At bedside Long and detailed discussion with the patient at the bedside regarding need for further work-up with specialists Disposition:  Status is: Inpatient Remains inpatient appropriate because:   Needs cardiac cath May need further work-up in the outpatient setting   Consultants:  Cardiology  Procedures:   Antimicrobials:   Subjective:  Doing better  Seems a little frustrated about his lack of mobility Long discussion with him and wife about getting Cardiac rehab and improving function  Objective: Vitals:   03/04/22 0320 03/04/22 0500 03/04/22 0537 03/04/22 0700  BP:    132/88  Pulse: 65 66 67 73  Resp: '14 13 14 18  '$ Temp:    (!) 97.4 F (36.3 C)  TempSrc:    Oral  SpO2: 97% 97% 97% 95%  Weight:      Height:        Intake/Output Summary (Last 24 hours) at 03/04/2022 1348 Last data filed at 03/04/2022 0500 Gross per 24 hour  Intake 370 ml  Output 600 ml  Net -230 ml    Filed Weights   02/28/22 2338 03/01/22 0200 03/01/22 0424  Weight: 127 kg 127 kg (!) 138 kg    Examination:  EOMI NCAT no focal deficit no wheeze Neck soft supple S1-S2 no murmur mild JVD obese nontender  no rebound no guarding No lower extremity edema ROM is intact Neuro 5/5 power  Data Reviewed: personally reviewed   CBC    Component Value Date/Time   WBC 5.1 03/04/2022 0113   RBC 3.38 (L) 03/04/2022 0113    HGB 10.6 (L) 03/04/2022 0113   HCT 33.0 (L) 03/04/2022 0113   PLT 223 03/04/2022 0113   MCV 97.6 03/04/2022 0113   MCH 31.4 03/04/2022 0113   MCHC 32.1 03/04/2022 0113   RDW 16.7 (H) 03/04/2022 0113   LYMPHSABS 0.9 03/04/2022 0113   MONOABS 1.0 03/04/2022 0113   EOSABS 0.1 03/04/2022 0113   BASOSABS 0.0 03/04/2022 0113      Latest Ref Rng & Units 03/04/2022    1:13 AM 03/03/2022   12:55 AM 03/02/2022    6:50 PM  CMP  Glucose 70 - 99 mg/dL 136   128   154    BUN 8 - 23 mg/dL 30   45   46    Creatinine 0.61 - 1.24 mg/dL 1.47   2.10   2.12    Sodium 135 - 145 mmol/L 139   136   136    Potassium 3.5 - 5.1 mmol/L 3.6   3.4   4.8    Chloride 98 - 111 mmol/L 101   97   99    CO2 22 - 32 mmol/L '29   29   26    '$ Calcium 8.9 - 10.3 mg/dL 9.7   9.3   9.1    Total Protein 6.5 - 8.1 [name]/dL 7.6    7.1    Total Bilirubin 0.3 - 1.2 mg/dL 0.4    1.0    Alkaline Phos 38 - 126 U/L 106    97    AST 15 - 41 U/L 30    84    ALT 0 - 44 U/L 55    72       Radiology Studies: VAS [place] LOWER EXTREMITY VENOUS (DVT)  Result Date: 03/03/2022  Lower Venous DVT Study Patient Name:  Keith [name]  Date of Exam:   03/03/2022 Medical Rec #: [id number]         Accession #:    [id number] Date of Birth: 08/15/1951        Patient Gender: M Patient Age:   71 years Exam Location:  [facility] Procedure:      VAS [place] LOWER EXTREMITY VENOUS (DVT) Referring Phys: Keith [name] --------------------------------------------------------------------------------  Indications: Pulmonary embolism, history of DVT/PE.  Anticoagulation: Heparin. Limitations: Body habitus and poor ultrasound/tissue interface. Comparison Study: 06-27-2020 Prior lower extremity venous bilateral was positive                   for LT popliteal, posterior tibial, and peroneal vein DVT. Performing Technologist: Keith [name] RDMS, RVT  Examination Guidelines: A complete evaluation includes B-mode imaging, spectral Doppler, color Doppler, and power  Doppler as needed of all accessible portions of each vessel. Bilateral testing is considered an integral part of a complete examination. Limited examinations for reoccurring indications may be performed as noted. The reflux portion of the exam is performed with the patient in reverse Trendelenburg.  +---------+---------------+---------+-----------+----------+--------------+ RIGHT    CompressibilityPhasicitySpontaneityPropertiesThrombus Aging +---------+---------------+---------+-----------+----------+--------------+ CFV      Full           No       Yes                                 +---------+---------------+---------+-----------+----------+--------------+  SFJ      Full                                                        +---------+---------------+---------+-----------+----------+--------------+ FV Prox  Full                                                        +---------+---------------+---------+-----------+----------+--------------+ FV Mid   Full                                                        +---------+---------------+---------+-----------+----------+--------------+ FV DistalFull                                                        +---------+---------------+---------+-----------+----------+--------------+ PFV      Full                                                        +---------+---------------+---------+-----------+----------+--------------+ POP      Full           No       Yes                                 +---------+---------------+---------+-----------+----------+--------------+ PTV      Full                                                        +---------+---------------+---------+-----------+----------+--------------+ PERO     Full                                                        +---------+---------------+---------+-----------+----------+--------------+    +---------+---------------+---------+-----------+----------+-------------------+ LEFT     CompressibilityPhasicitySpontaneityPropertiesThrombus Aging      +---------+---------------+---------+-----------+----------+-------------------+ CFV      Full           No       Yes                                      +---------+---------------+---------+-----------+----------+-------------------+ SFJ      Full                                                             +---------+---------------+---------+-----------+----------+-------------------+  FV Prox  Full                                                             +---------+---------------+---------+-----------+----------+-------------------+ FV Mid   Full                                                             +---------+---------------+---------+-----------+----------+-------------------+ FV DistalFull                                                             +---------+---------------+---------+-----------+----------+-------------------+ PFV      Full                                                             +---------+---------------+---------+-----------+----------+-------------------+ POP      Full           No       Yes                                      +---------+---------------+---------+-----------+----------+-------------------+ PTV      Full                                         Some segments not                                                         well visualized     +---------+---------------+---------+-----------+----------+-------------------+ PERO     Full                                         Some segments not                                                         well visualized     +---------+---------------+---------+-----------+----------+-------------------+    Summary: RIGHT: - There is no evidence of deep vein thrombosis in the lower  extremity.  - No cystic structure found in the popliteal fossa.  LEFT: - There is no evidence of deep vein thrombosis in the lower extremity. However, portions of this examination were  limited- see technologist comments above.  - No cystic structure found in the popliteal fossa.  *See table(s) above for measurements and observations.    Preliminary      Scheduled Meds:  apixaban  10 mg Oral BID   Followed by   Derrill Memo ON 03/09/2022] apixaban  5 mg Oral BID   cloNIDine  0.1 mg Oral Daily   DULoxetine  60 mg Oral Daily   furosemide  40 mg Intravenous Once   gabapentin  300 mg Oral BID   levETIRAcetam  250 mg Oral BID   mouth rinse  15 mL Mouth Rinse BID   multivitamin with minerals  1 tablet Oral Q breakfast   potassium chloride  10 mEq Oral Daily   sodium chloride flush  3 mL Intravenous Q12H   sodium chloride flush  3 mL Intravenous Q12H   [START ON 03/05/2022] torsemide  20 mg Oral Q M,W,F   Continuous Infusions:  sodium chloride       LOS: 3 days   Time spent: [place], MD [facility] To contact the attending provider between 7A-7P or the covering provider during after hours 7P-7A, please log into the web site [email or web address] and access using universal [place] password for that web site. If you do not have the password, please call the hospital operator.  03/04/2022, 1:48 PM

## 2022-03-04 NOTE — Consult Note (Signed)
Ref: [name], [name], MD   Subjective:  Aware of high risk for surgery and evaluation at tertiary care. Eliquis is already on. Diet and activity are discussed on daily basis.  Objective:  Vital Signs in the last 24 hours: Temp:  [97.4 F (36.3 C)-98.1 F (36.7 C)] 97.4 F (36.3 C) (05/28 0700) Pulse Rate:  [64-73] 73 (05/28 0700) Cardiac Rhythm: Normal sinus rhythm (05/28 0700) Resp:  [13-18] 18 (05/28 0700) BP: (120-136)/(73-88) 132/88 (05/28 0700) SpO2:  [95 %-99 %] 95 % (05/28 0700)  Physical Exam: BP Readings from Last 1 Encounters:  03/04/22 132/88     Wt Readings from Last 1 Encounters:  03/01/22 (!) 138 kg    Weight change:  Body mass index is 41.26 kg/m. HEENT: [place]/AT, Eyes-Brown, Conjunctiva-Pink, Sclera-Non-icteric Neck: No JVD, No bruit, Trachea midline. Lungs:  Clearing, Bilateral. Cardiac:  Regular rhythm, normal S1 and S2, no S3. II/VI systolic murmur. Abdomen:  Soft, non-tender. BS present. Extremities:  2 + edema present. No cyanosis. No clubbing. CNS: AxOx3, Cranial nerves grossly intact, moves all 4 extremities.  Skin: Warm and dry.   Intake/Output from previous day: 05/27 0701 - 05/28 0700 In: 857.6 [P.O.:846; I.V.:11.6] Out: 1081 [Urine:1080; Stool:1]    Lab Results: BMET    Component Value Date/Time   NA 139 03/04/2022 0113   NA 136 03/03/2022 0055   NA 136 03/02/2022 1850   K 3.6 03/04/2022 0113   K 3.4 (L) 03/03/2022 0055   K 4.8 03/02/2022 1850   CL 101 03/04/2022 0113   CL 97 (L) 03/03/2022 0055   CL 99 03/02/2022 1850   CO2 29 03/04/2022 0113   CO2 29 03/03/2022 0055   CO2 26 03/02/2022 1850   GLUCOSE 136 (H) 03/04/2022 0113   GLUCOSE 128 (H) 03/03/2022 0055   GLUCOSE 154 (H) 03/02/2022 1850   GLUCOSE 100 (H) 09/09/2006 0956   BUN 30 (H) 03/04/2022 0113   BUN 45 (H) 03/03/2022 0055   BUN 46 (H) 03/02/2022 1850   CREATININE 1.47 (H) 03/04/2022 0113   CREATININE 2.10 (H) 03/03/2022 0055   CREATININE 2.12 (H) 03/02/2022 1850    CALCIUM 9.7 03/04/2022 0113   CALCIUM 9.3 03/03/2022 0055   CALCIUM 9.1 03/02/2022 1850   GFRNONAA 51 (L) 03/04/2022 0113   GFRNONAA 33 (L) 03/03/2022 0055   GFRNONAA 33 (L) 03/02/2022 1850   GFRAA >60 07/11/2020 0619   GFRAA >60 07/05/2020 0456   GFRAA >60 07/03/2020 0640   CBC    Component Value Date/Time   WBC 5.1 03/04/2022 0113   RBC 3.38 (L) 03/04/2022 0113   HGB 10.6 (L) 03/04/2022 0113   HCT 33.0 (L) 03/04/2022 0113   PLT 223 03/04/2022 0113   MCV 97.6 03/04/2022 0113   MCH 31.4 03/04/2022 0113   MCHC 32.1 03/04/2022 0113   RDW 16.7 (H) 03/04/2022 0113   LYMPHSABS 0.9 03/04/2022 0113   MONOABS 1.0 03/04/2022 0113   EOSABS 0.1 03/04/2022 0113   BASOSABS 0.0 03/04/2022 0113   HEPATIC Function Panel Recent Labs    03/01/22 0711 03/02/22 1850 03/04/22 0113  PROT 7.7 7.1 7.6   HEMOGLOBIN A1C No components found for: HGA1C,  MPG CARDIAC ENZYMES No results found for: CKTOTAL, CKMB, CKMBINDEX, TROPONINI BNP No results for input(s): PROBNP in the last 8760 hours. TSH No results for input(s): TSH in the last 8760 hours. CHOLESTEROL Recent Labs    01/17/22 1140  CHOL 140    Scheduled Meds:  apixaban  10 mg Oral  BID   Followed by   Derrill Memo ON 03/09/2022] apixaban  5 mg Oral BID   cloNIDine  0.1 mg Oral Daily   DULoxetine  60 mg Oral Daily   furosemide  40 mg Intravenous Once   gabapentin  300 mg Oral BID   levETIRAcetam  250 mg Oral BID   mouth rinse  15 mL Mouth Rinse BID   multivitamin with minerals  1 tablet Oral Q breakfast   potassium chloride  10 mEq Oral Daily   sodium chloride flush  3 mL Intravenous Q12H   sodium chloride flush  3 mL Intravenous Q12H   [START ON 03/05/2022] torsemide  20 mg Oral Q M,W,F   Continuous Infusions:  sodium chloride     PRN Meds:.sodium chloride, acetaminophen, ondansetron (ZOFRAN) IV, oxyCODONE-acetaminophen, sodium chloride flush  Assessment/Plan:   Non-cardiac chest pain Acute diastolic left heart  failure Chronic RV failure Moderate pulmonary HTN from chronic thromboembolism Moderate to severe TR from RV dilatation from pulmonary HTN/PE HTN HLD Morbid obesity Type 2 DM Acute renal failure  Plan: Appreciate CVTS consult. Medical therapy for now. Tertiary care referral when patient is ready.   LOS: 3 days   Time spent including chart review, lab review, examination, discussion with patient/Family/Doctor : 30 min   Dixie Dials  MD  03/04/2022, 1:23 PM

## 2022-03-05 DIAGNOSIS — R079 Chest pain, unspecified: Secondary | ICD-10-CM | POA: Diagnosis not present

## 2022-03-05 LAB — RENAL FUNCTION PANEL
Albumin: 3.5 g/dL (ref 3.5–5.0)
Anion gap: 7 (ref 5–15)
BUN: 25 mg/dL — ABNORMAL HIGH (ref 8–23)
CO2: 31 mmol/L (ref 22–32)
Calcium: 10 mg/dL (ref 8.9–10.3)
Chloride: 99 mmol/L (ref 98–111)
Creatinine, Ser: 1.6 mg/dL — ABNORMAL HIGH (ref 0.61–1.24)
GFR, Estimated: 46 mL/min — ABNORMAL LOW (ref >60)
Glucose, Bld: 143 mg/dL — ABNORMAL HIGH (ref 70–99)
Phosphorus: 3 mg/dL (ref 2.5–4.6)
Potassium: 3.6 mmol/L (ref 3.5–5.1)
Sodium: 137 mmol/L (ref 135–145)

## 2022-03-05 LAB — CBC WITH DIFFERENTIAL/PLATELET
Abs Immature Granulocytes: 0.02 10*3/uL (ref 0.00–0.07)
Basophils Absolute: 0 10*3/uL (ref 0.0–0.1)
Basophils Relative: 0 %
Eosinophils Absolute: 0.1 10*3/uL (ref 0.0–0.5)
Eosinophils Relative: 1 %
HCT: 34.3 % — ABNORMAL LOW (ref 39.0–52.0)
Hemoglobin: 11.2 g/dL — ABNORMAL LOW (ref 13.0–17.0)
Immature Granulocytes: 0 %
Lymphocytes Relative: 15 %
Lymphs Abs: 0.9 10*3/uL (ref 0.7–4.0)
MCH: 31.5 pg (ref 26.0–34.0)
MCHC: 32.7 g/dL (ref 30.0–36.0)
MCV: 96.3 fL (ref 80.0–100.0)
Monocytes Absolute: 1.1 10*3/uL — ABNORMAL HIGH (ref 0.1–1.0)
Monocytes Relative: 17 %
Neutro Abs: 4.1 10*3/uL (ref 1.7–7.7)
Neutrophils Relative %: 67 %
Platelets: 232 10*3/uL (ref 150–400)
RBC: 3.56 MIL/uL — ABNORMAL LOW (ref 4.22–5.81)
RDW: 16.8 % — ABNORMAL HIGH (ref 11.5–15.5)
WBC: 6.2 10*3/uL (ref 4.0–10.5)
nRBC: 0.3 % — ABNORMAL HIGH (ref 0.0–0.2)

## 2022-03-05 LAB — LIPOPROTEIN A (LPA): Lipoprotein (a): 147.2 nmol/L — ABNORMAL HIGH (ref <75.0)

## 2022-03-05 MED ORDER — BISACODYL 5 MG PO TBEC: 5.0000 mg | DELAYED_RELEASE_TABLET | Freq: Every day | ORAL | Status: DC | PRN

## 2022-03-05 MED ORDER — CARVEDILOL 3.125 MG PO TABS
3.1250 mg | ORAL_TABLET | Freq: Two times a day (BID) | ORAL | Status: DC
  Administered 2022-03-05 – 2022-03-06 (×2): 3.125 mg via ORAL
  Filled 2022-03-05 (×2): qty 1

## 2022-03-05 MED ORDER — POLYETHYLENE GLYCOL 3350 17 G PO PACK
17.0000 g | PACK | Freq: Every day | ORAL | Status: DC
  Administered 2022-03-05: 17 g via ORAL
  Filled 2022-03-05 (×2): qty 1

## 2022-03-05 MED ORDER — HYDROCORTISONE (PERIANAL) 2.5 % EX CREA
TOPICAL_CREAM | Freq: Three times a day (TID) | CUTANEOUS | Status: DC
  Filled 2022-03-05: qty 28.35

## 2022-03-05 MED ORDER — SENNOSIDES-DOCUSATE SODIUM 8.6-50 MG PO TABS
1.0000 | ORAL_TABLET | Freq: Every day | ORAL | Status: DC
  Administered 2022-03-05: 1 via ORAL
  Filled 2022-03-05: qty 1

## 2022-03-05 NOTE — Care Management Important Message (Signed)
Important Message  Patient Details  Name: Keith [name] MRN: [id number] Date of Birth: 12/13/1950   Medicare Important Message Given:  Yes     Deitrich Steve Montine Circle 03/05/2022, 1:44 PM

## 2022-03-05 NOTE — Progress Notes (Signed)
PROGRESS NOTE   Keith [name]  BOF:[id number] DOB: Sep 09, 1951 DOA: 02/28/2022 PCP: [name], [name], MD  Brief Narrative:   89 home dwelling black male Known saddle pulmonary embolism/eetvt 04/7823-[id number] complicated by significant hypotension needed Levophed--also fell and had malleoli or ankle fracture in her requiring ORIF and was discharged to CIR--- stopped anticoagulation apparently 11/2020 Prior pleural effusion 11/2020 secondary to pneumonia?  Exudative Left adrenal adenoma DM TY 2 with neuropathy +/- hypoglycemia Class II obesity HTN Chronic pain secondary to cervicalgia on opiates + depression   Office communications from 5/23 note patient was missed taking his duloxetine-taking 2 capsules instead of 1-developed blood pressures in the 90s, dizziness dry mouth nausea nation constipation-then subsequently apparently developed some pain across his chest-with radiation and SOB-worse with movement exercise exertion--?  Pleuritic component  5/25: Patient admitted-VQ scan showing probability of PE 5/26 cardiac cath Dr. Doylene Canard = tricuspid regurg 4+ in the setting of very reduced RVEF 5/27: US dvt study neg 5/28: NS consulted--OP follow up ? [facility] rec and not candidate here for Mech thrombectomy   Hospital-Problem based course  Acute PE [has had prior saddle embolus 06/2020 requiring Levophed because of hypotension] On DOAC-will be lifelong Doppler ultrasound lower extremities are pending-he has right >left swelling Severe pulmonary hypertension [? WHO grp iv [Chr VTE] Severe TR as a result of pulmonary hypertension? CvC liver 2/2 hepatomegaly from right-sided symptoms Appreciate Dr. Kipp Brood input-->will need close f/u Dr. Doylene Canard as OP Cannot keep him too "dry"--is pre-load dependant--placed on torsemide 20 mwf per Cardiology DM TY 2+ neuropathy A1c 6.6 01/17/2022 CBGs ranging 120-144 Continue gabapentin 300 bid-dose downward adjusted this admit HTN Continue clonidine  0.1 daily for now pulmonary hypertension physiology, would add hydralazine if blood pressures go above 676 systolic AKI on admission 2/2 PE physiology versus Tenoretic from PTA Renal function stable Chronic cervicalgia degenerative disc disease and depression Patient does see pain management doctor Bartco at [facility] neuro[facility] Continue Cymbalta 60 daily oxycodone 1 tabs every 4 a Patient had recent surgery November 2022  Hemorrhoids with mild bleed Anusol supp, TUCKS Hemoglobin is very stable--if drops will work-up    DVT prophylaxis: On heparin currently Code Status: DNAR Family Communication: No one present At bedside Long and detailed discussion with the patient  Disposition:  Status is: Inpatient Remains inpatient appropriate because:   Needs cardiac cath May need further work-up in the outpatient setting   Consultants:  Cardiology  Procedures:   Antimicrobials:   Subjective:  Well Some hemorrhoidal bleed No cp fever no so, no LE swelling  Objective: Vitals:   03/05/22 0300 03/05/22 0500 03/05/22 0600 03/05/22 0817  BP: 119/84   112/82  Pulse: 74 75 77 79  Resp: '18 16 16 17  '$ Temp: 98.4 F (36.9 C)   98.8 F (37.1 C)  TempSrc: Oral   Oral  SpO2: 97% 95% 95% 95%  Weight:      Height:        Intake/Output Summary (Last 24 hours) at 03/05/2022 1744 Last data filed at 03/05/2022 1409 Gross per 24 hour  Intake 618 ml  Output 1625 ml  Net -1007 ml    Filed Weights   02/28/22 2338 03/01/22 0200 03/01/22 0424  Weight: 127 kg 127 kg (!) 138 kg    Examination:  EOMI NCAT  no wheeze Neck soft supple S1-S2 no murmur mild JVD obese nontender no rebound no guarding No lower extremity edema ROM is intact Neuro 5/5 power  Data Reviewed: personally reviewed  CBC    Component Value Date/Time   WBC 6.2 03/05/2022 0034   RBC 3.56 (L) 03/05/2022 0034   HGB 11.2 (L) 03/05/2022 0034   HCT 34.3 (L) 03/05/2022 0034   PLT 232 03/05/2022 0034   MCV 96.3  03/05/2022 0034   MCH 31.5 03/05/2022 0034   MCHC 32.7 03/05/2022 0034   RDW 16.8 (H) 03/05/2022 0034   LYMPHSABS 0.9 03/05/2022 0034   MONOABS 1.1 (H) 03/05/2022 0034   EOSABS 0.1 03/05/2022 0034   BASOSABS 0.0 03/05/2022 0034      Latest Ref Rng & Units 03/05/2022   12:34 AM 03/04/2022    1:13 AM 03/03/2022   12:55 AM  CMP  Glucose 70 - 99 mg/dL 143   136   128    BUN 8 - 23 mg/dL 25   30   45    Creatinine 0.61 - 1.24 mg/dL 1.60   1.47   2.10    Sodium 135 - 145 mmol/L 137   139   136    Potassium 3.5 - 5.1 mmol/L 3.6   3.6   3.4    Chloride 98 - 111 mmol/L 99   101   97    CO2 22 - 32 mmol/L '31   29   29    '$ Calcium 8.9 - 10.3 mg/dL 10.0   9.7   9.3    Total Protein 6.5 - 8.1 g/dL  7.6     Total Bilirubin 0.3 - 1.2 mg/dL  0.4     Alkaline Phos 38 - 126 U/L  106     AST 15 - 41 U/L  30     ALT 0 - 44 U/L  55        Radiology Studies: No results found.   Scheduled Meds:  apixaban  10 mg Oral BID   Followed by   Derrill Memo ON 03/09/2022] apixaban  5 mg Oral BID   carvedilol  3.125 mg Oral BID WC   cloNIDine  0.1 mg Oral Daily   DULoxetine  60 mg Oral Daily   gabapentin  300 mg Oral BID   hydrocortisone   Rectal TID   levETIRAcetam  250 mg Oral BID   mouth rinse  15 mL Mouth Rinse BID   multivitamin with minerals  1 tablet Oral Q breakfast   polyethylene glycol  17 g Oral Daily   potassium chloride  10 mEq Oral Daily   senna-docusate  1 tablet Oral QHS   sodium chloride flush  3 mL Intravenous Q12H   sodium chloride flush  3 mL Intravenous Q12H   torsemide  20 mg Oral Q M,W,F   Continuous Infusions:  sodium chloride       LOS: 4 days   Time spent: [place], MD [facility] To contact the attending provider between 7A-7P or the covering provider during after hours 7P-7A, please log into the web site [email or web address] and access using universal [place] password for that web site. If you do not have the password, please call the hospital  operator.  03/05/2022, 5:44 PM

## 2022-03-05 NOTE — Consult Note (Signed)
Ref: [name], Keith G, MD   Subjective:  Awake.  Short of breath with activity.  Objective:  Vital Signs in the last 24 hours: Temp:  [97.6 F (36.4 C)-98.8 F (37.1 C)] 98.8 F (37.1 C) (05/29 0817) Pulse Rate:  [72-83] 79 (05/29 0817) Cardiac Rhythm: Bundle branch block;Heart block (05/29 0718) Resp:  [14-18] 17 (05/29 0817) BP: (112-136)/(75-85) 112/82 (05/29 0817) SpO2:  [93 %-97 %] 95 % (05/29 0817)  Physical Exam: BP Readings from Last 1 Encounters:  03/05/22 112/82     Wt Readings from Last 1 Encounters:  03/01/22 (!) 138 kg    Weight change:  Body mass index is 41.26 kg/m. HEENT: [place]/AT, Eyes-Brown, PERL, EOMI, Conjunctiva-Pink, Sclera-Non-icteric Neck: No JVD, No bruit, Trachea midline. Lungs:  Clear, Bilateral. Cardiac:  Regular rhythm, normal S1 and S2, no S3. II/VI systolic murmur. Abdomen:  Soft, non-tender. BS present. Extremities:  No edema present. No cyanosis. No clubbing. CNS: AxOx3, Cranial nerves grossly intact, moves all 4 extremities.  Skin: Warm and dry.   Intake/Output from previous day: 05/28 0701 - 05/29 0700 In: 30 [P.O.:600; I.V.:10] Out: 1675 [Urine:1675]    Lab Results: BMET    Component Value Date/Time   NA 137 03/05/2022 0034   NA 139 03/04/2022 0113   NA 136 03/03/2022 0055   K 3.6 03/05/2022 0034   K 3.6 03/04/2022 0113   K 3.4 (L) 03/03/2022 0055   CL 99 03/05/2022 0034   CL 101 03/04/2022 0113   CL 97 (L) 03/03/2022 0055   CO2 31 03/05/2022 0034   CO2 29 03/04/2022 0113   CO2 29 03/03/2022 0055   GLUCOSE 143 (H) 03/05/2022 0034   GLUCOSE 136 (H) 03/04/2022 0113   GLUCOSE 128 (H) 03/03/2022 0055   GLUCOSE 100 (H) 09/09/2006 0956   BUN 25 (H) 03/05/2022 0034   BUN 30 (H) 03/04/2022 0113   BUN 45 (H) 03/03/2022 0055   CREATININE 1.60 (H) 03/05/2022 0034   CREATININE 1.47 (H) 03/04/2022 0113   CREATININE 2.10 (H) 03/03/2022 0055   CALCIUM 10.0 03/05/2022 0034   CALCIUM 9.7 03/04/2022 0113   CALCIUM 9.3 03/03/2022  0055   GFRNONAA 46 (L) 03/05/2022 0034   GFRNONAA 51 (L) 03/04/2022 0113   GFRNONAA 33 (L) 03/03/2022 0055   GFRAA >60 07/11/2020 0619   GFRAA >60 07/05/2020 0456   GFRAA >60 07/03/2020 0640   CBC    Component Value Date/Time   WBC 6.2 03/05/2022 0034   RBC 3.56 (L) 03/05/2022 0034   HGB 11.2 (L) 03/05/2022 0034   HCT 34.3 (L) 03/05/2022 0034   PLT 232 03/05/2022 0034   MCV 96.3 03/05/2022 0034   MCH 31.5 03/05/2022 0034   MCHC 32.7 03/05/2022 0034   RDW 16.8 (H) 03/05/2022 0034   LYMPHSABS 0.9 03/05/2022 0034   MONOABS 1.1 (H) 03/05/2022 0034   EOSABS 0.1 03/05/2022 0034   BASOSABS 0.0 03/05/2022 0034   HEPATIC Function Panel Recent Labs    03/01/22 0711 03/02/22 1850 03/04/22 0113  PROT 7.7 7.1 7.6   HEMOGLOBIN A1C No components found for: HGA1C,  MPG CARDIAC ENZYMES No results found for: CKTOTAL, CKMB, CKMBINDEX, TROPONINI BNP No results for input(s): PROBNP in the last 8760 hours. TSH No results for input(s): TSH in the last 8760 hours. CHOLESTEROL Recent Labs    01/17/22 1140  CHOL 140    Scheduled Meds:  apixaban  10 mg Oral BID   Followed by   Derrill Memo ON 03/09/2022] apixaban  5 mg  Oral BID   cloNIDine  0.1 mg Oral Daily   DULoxetine  60 mg Oral Daily   gabapentin  300 mg Oral BID   hydrocortisone   Rectal TID   levETIRAcetam  250 mg Oral BID   mouth rinse  15 mL Mouth Rinse BID   multivitamin with minerals  1 tablet Oral Q breakfast   potassium chloride  10 mEq Oral Daily   sodium chloride flush  3 mL Intravenous Q12H   sodium chloride flush  3 mL Intravenous Q12H   torsemide  20 mg Oral Q M,W,F   Continuous Infusions:  sodium chloride     PRN Meds:.sodium chloride, acetaminophen, ondansetron (ZOFRAN) IV, oxyCODONE-acetaminophen, sodium chloride flush, witch hazel-glycerin  Assessment/Plan:  Non-cardiac chest pain Acute diastolic left heart failure Chronic RV failure Moderate pulmonary hypertension CTEPH HTN, essential HLD Morbid  obesity Type 2 DM Acute renal failure on CKD, IIIa  Plan: Increase activity. F/U in 1 month.   LOS: 4 days   Time spent including chart review, lab review, examination, discussion with patient/Nurse/Doctor: 30 min   Dixie Dials  MD  03/05/2022, 11:28 AM

## 2022-03-05 NOTE — Plan of Care (Signed)
  Problem: Health Behavior/Discharge Planning: Goal: Ability to manage health-related needs will improve Outcome: Not Progressing   Problem: Clinical Measurements: Goal: Ability to maintain clinical measurements within normal limits will improve Outcome: Not Progressing   Problem: Activity: Goal: Risk for activity intolerance will decrease Outcome: Not Progressing   Problem: Coping: Goal: Level of anxiety will decrease Outcome: Not Progressing   Problem: Elimination: Goal: Will not experience complications related to bowel motility Outcome: Not Progressing   Problem: Activity: Goal: Ability to tolerate increased activity will improve Outcome: Not Progressing   Problem: Health Behavior/Discharge Planning: Goal: Ability to safely manage health-related needs after discharge will improve Outcome: Not Progressing   Problem: Education: Goal: Knowledge of General Education information will improve Description: Including pain rating scale, medication(s)/side effects and non-pharmacologic comfort measures Outcome: Progressing   Problem: Clinical Measurements: Goal: Will remain free from infection Outcome: Progressing Goal: Diagnostic test results will improve Outcome: Progressing Goal: Respiratory complications will improve Outcome: Progressing Goal: Cardiovascular complication will be avoided Outcome: Progressing   Problem: Nutrition: Goal: Adequate nutrition will be maintained Outcome: Progressing   Problem: Elimination: Goal: Will not experience complications related to urinary retention Outcome: Progressing   Problem: Pain Managment: Goal: General experience of comfort will improve Outcome: Progressing   Problem: Safety: Goal: Ability to remain free from injury will improve Outcome: Progressing   Problem: Skin Integrity: Goal: Risk for impaired skin integrity will decrease Outcome: Progressing   Problem: Education: Goal: Understanding of cardiac disease, CV  risk reduction, and recovery process will improve Outcome: Progressing Goal: Individualized Educational Video(s) Outcome: Progressing   Problem: Cardiac: Goal: Ability to achieve and maintain adequate cardiovascular perfusion will improve Outcome: Progressing

## 2022-03-05 NOTE — Care Management Important Message (Signed)
Important Message  Patient Details  Name: Keith [name] MRN: [id number] Date of Birth: [birth year]   Medicare Important Message Given:  Yes     [name] 03/05/2022, 1:45 PM

## 2022-03-06 ENCOUNTER — Other Ambulatory Visit (HOSPITAL_COMMUNITY): Payer: Self-pay

## 2022-03-06 DIAGNOSIS — R079 Chest pain, unspecified: Secondary | ICD-10-CM | POA: Diagnosis not present

## 2022-03-06 MED ORDER — CARVEDILOL 3.125 MG PO TABS: 3.1250 mg | ORAL_TABLET | Freq: Two times a day (BID) | ORAL | 2 refills | Status: DC

## 2022-03-06 MED ORDER — APIXABAN 5 MG PO TABS
5.0000 mg | ORAL_TABLET | Freq: Two times a day (BID) | ORAL | 11 refills | Status: DC
  Filled 2022-03-06: qty 60, 30d supply, fill #0

## 2022-03-06 MED ORDER — APIXABAN 5 MG PO TABS: 10.0000 mg | ORAL_TABLET | Freq: Two times a day (BID) | ORAL | 0 refills | Status: DC

## 2022-03-06 MED ORDER — GABAPENTIN 300 MG PO CAPS: 300.0000 mg | ORAL_CAPSULE | Freq: Two times a day (BID) | ORAL | 3 refills | Status: DC

## 2022-03-06 MED ORDER — CARVEDILOL 3.125 MG PO TABS
3.1250 mg | ORAL_TABLET | Freq: Two times a day (BID) | ORAL | 2 refills | Status: DC
  Filled 2022-03-06: qty 60, 30d supply, fill #0
  Filled 2022-03-06: qty 30, 15d supply, fill #0

## 2022-03-06 MED ORDER — GABAPENTIN 300 MG PO CAPS
300.0000 mg | ORAL_CAPSULE | Freq: Two times a day (BID) | ORAL | 3 refills | Status: DC
  Filled 2022-03-06: qty 60, 30d supply, fill #0

## 2022-03-06 MED ORDER — APIXABAN 5 MG PO TABS: 5.0000 mg | ORAL_TABLET | Freq: Two times a day (BID) | ORAL | 11 refills | Status: DC

## 2022-03-06 MED ORDER — APIXABAN 5 MG PO TABS
10.0000 mg | ORAL_TABLET | Freq: Two times a day (BID) | ORAL | 0 refills | Status: DC
Start: 2022-03-06 — End: 2022-03-14
  Filled 2022-03-06: qty 64, 32d supply, fill #0

## 2022-03-06 MED ORDER — TORSEMIDE 20 MG PO TABS: 20.0000 mg | ORAL_TABLET | ORAL | 3 refills | Status: DC

## 2022-03-06 MED ORDER — HYDROCORTISONE (PERIANAL) 2.5 % EX CREA: TOPICAL_CREAM | Freq: Three times a day (TID) | CUTANEOUS | 0 refills | Status: DC

## 2022-03-06 MED ORDER — HYDROCORTISONE (PERIANAL) 2.5 % EX CREA
TOPICAL_CREAM | Freq: Three times a day (TID) | CUTANEOUS | 0 refills | Status: DC
  Filled 2022-03-06: qty 30, 12d supply, fill #0

## 2022-03-06 MED FILL — Lidocaine HCl Local Preservative Free (PF) Inj 1%: INTRAMUSCULAR | Qty: 30 | Status: AC

## 2022-03-06 NOTE — Progress Notes (Signed)
Heart Failure Nurse Navigator Progress Note  Echo-55-60%, G1DD, sev reduced RVEF, TR, Pulm htn  Pulm htn Group 4, ?[facility] for mec thrombectomy   H&V TOC appt sch for 6/7  Arissa Fagin, RN, BSN, [facility] Heart Failure Navigator Heart & Vascular Care Navigation Team

## 2022-03-06 NOTE — Consult Note (Signed)
[facility] Inpatient Consult  03/06/2022  Keith [name] [birth year] [id number]  [facility] Management [facility] CM)   Patient chart has been reviewed with noted high risk score for unplanned readmissions.  Patient assessed for community [place] Management follow up needs. Per review, patient's primary provider office offers embedded chronic care coordination services and patient has been engaged by embedded pharmacy team. Spoke with patient bedside. Explained [facility] CM embedded chronic care coordination services. Patient states that he has a good home support system. Provided appointment follow up reminder card.  Of note, [facility] Care Management services does not replace or interfere with any services that are arranged by inpatient case management or social work.    Netta Cedars, MSN, RN [place] Liaison Toll free office [phone]

## 2022-03-06 NOTE — Progress Notes (Signed)
Mobility Specialist Progress Note    03/06/22 1020  Mobility  Activity Transferred to/from [facility]  Level of Assistance +2 (takes two people)  Games developer wheel walker  Activity Response Tolerated fair  $Mobility charge 1 Mobility   Had BM. Left in chair with call bell in reach.   Hildred Alamin Mobility Specialist  Primary: 5N M.S. Phone: [phone] Secondary: 6N M.S. Phone: [phone]

## 2022-03-06 NOTE — TOC Benefit Eligibility Note (Signed)
Patient Teacher, English as a foreign language completed.    The patient is currently admitted and upon discharge could be taking Xarelto 20 mg.  The current 30 day co-pay is, $47.00.   The patient is insured through [place] Patient Advocate Specialist [place] Patient Advocate Team Direct Number: [phone]  Fax: [phone]

## 2022-03-06 NOTE — Discharge Summary (Signed)
Physician Discharge Summary  MACAI SISNEROS ITJ:[id number] DOB: 1951-01-01 DOA: 02/28/2022  PCP: [name], [name], MD  Admit date: 02/28/2022 Discharge date: 03/06/2022  Time spent: 44 minutes  Recommendations for Outpatient Follow-up:  Requires Chem-7 CBC 1 week at PCP office I will CC Dr. [name] Recommend outpatient discussion with Dr. Doylene Canard of cardiology for group for pulmonary hypertension--unclear if he is a surgical candidate and will need to be referred to [facility] for mechanical thrombectomy once his right ventricle recovers-CC Dr. Doylene Canard Adjust medications carefully for heart rate, function, fluid status-this can be managed in the outpatient setting however Will need lifelong anticoagulation going forward Please refer back to pain management and ensure that he is on minimal meds--he may metaglip that from multimodal management and therapy without need for meds-defer to PCP to coordinate the same  Discharge Diagnoses:  MAIN problem for hospitalization   Acute pulmonary embolism Group 4 pulmonary hypertension  Please see below for itemized issues addressed in HOpsital- refer to other progress notes for clarity if needed  Discharge Condition: Fair  Diet recommendation: Heart healthy  Filed Weights   02/28/22 2338 03/01/22 0200 03/01/22 0424  Weight: 127 kg 127 kg (!) 138 kg    History of present illness:  71 home dwelling black male Known saddle pulmonary embolism/eetvt 02/157-[id number] complicated by significant hypotension needed Levophed--also fell and had malleoli or ankle fracture in her requiring ORIF and was discharged to CIR--- stopped anticoagulation apparently 11/2020 Prior pleural effusion 11/2020 secondary to pneumonia?  Exudative Left adrenal adenoma DM TY 2 with neuropathy +/- hypoglycemia Class II obesity HTN Chronic pain secondary to cervicalgia on opiates + depression   Office communications from 5/23 note patient was missed taking his  duloxetine-taking 2 capsules instead of 1-developed blood pressures in the 90s, dizziness dry mouth nausea nation constipation-then subsequently apparently developed some pain across his chest-with radiation and SOB-worse with movement exercise exertion--?  Pleuritic component   5/25: Patient admitted-VQ scan showing probability of PE 5/26 cardiac cath Dr. Doylene Canard = tricuspid regurg 4+ in the setting of very reduced RVEF 5/27: US dvt study neg 5/28: CVTS consulted--OP follow up ? [place] rec and not candidate here for Mech thrombectomy  Hospital Course:  Acute PE [has had prior saddle embolus 06/2020 requiring Levophed because of hypotension] On DOAC-will be lifelong--- transition doses have been ordered for the patient of this and he will continue this lifelong Doppler ultrasound lower legs were negative-he has right >left swelling Severe pulmonary hypertension [? WHO grp iv [Chr VTE] Severe TR as a result of pulmonary hypertension? CvC liver 2/2 hepatomegaly from right-sided symptoms Appreciate Dr. Kipp Brood input-->will need close f/u Dr. Doylene Canard as OP Cannot keep him too "dry"--is pre-load dependant--placed on torsemide 20 mwf as well as Coreg twice daily and will continue clonidine per Cardiology  Would not use ACE ARB type medications or thiazides unless needed-be careful with the physiology that he has and allow for recovery CC [place] on discharge--- might benefit from advanced heart failure team follow-up in the outpatient setting as well per Dr. Doylene Canard DM TY 2+ neuropathy A1c 6.6 01/17/2022 CBGs moderately controlled Continue gabapentin 300 bid-dose downward adjusted this admit HTN  pulmonary hypertension physiology--- see all above discussiuons AKI on admission 2/2 PE physiology versus Tenoretic from PTA Renal function stable Chronic cervicalgia degenerative disc disease and depression Patient does see pain management doctor Bartco at [facility] neurosurgery Continue Cymbalta 60 daily  oxycodone 1 tabs every 4 a Patient had recent surgery November 2022  Needs outpatient pain management and de-escalate pain meds in the outpatient setting this was started during hospital stay Hemorrhoids with mild bleed Anusol supp, TUCKS Hemoglobin is very stable--he is due for colonoscopy per him as it has been 5 years Given his stability during hospital stay and lack of bleeding on day of discharge (no stool no hemorrhoidal bleed) I feel he is safe to continue his DOAC  Discharge Exam: Vitals:   03/06/22 0439 03/06/22 0703  BP: 117/83 138/86  Pulse: 78 80  Resp: 16   Temp: 98.3 F (36.8 C)   SpO2: 96%     Subj on day of d/c   Doing well worked with therapy just now no chest pain fever chills nausea vomiting  General Exam on discharge  EOMI NCAT no focal deficit Mallampati 4 CTA B no added sound rales rhonchi wheeze S1-S2 no murmur Abdomen soft no rebound no guarding   Discharge Instructions   Discharge Instructions     Diet - low sodium heart healthy   Complete by: As directed    Discharge instructions   Complete by: As directed    Please make sure that you take all your fluid pills as directed-it is important that you carefully take them as if you are to "dry" you could feel dizzy-your meds will have to be adjusted carefully by Dr. Doylene Canard of cardiology in the outpatient setting you will notice that you have been stopped off of several other blood pressure medications and you will be on new ones called Coreg and clonidine Your clonidine is an interesting medication-please do not miss it-if you do you will find you have very high blood pressures I would recommend that you get labs in about 1 week with your primary care physician and follow-up with 1 month with Dr. Doylene Canard At that time you can decide if you want a referral to [facility] for removal of the clots in your heart leg we discussed earlier It would be a good idea that you have these discussions and decide carefully  with your wife how you want to proceed Because of your low back pain etc. etc.-I do think that you should come off of several medications and try to reduce the amount of pain meds in addition to the spasm and nerve pills that you are on I would also recommend that you continue a blood thinner lifelong because of your repeat blood clots in the past  Best of luck have a nice summer   Increase activity slowly   Complete by: As directed       Allergies as of 03/06/2022       Reactions   Aspirin Other (See Comments)   Irritates the stomach and the patient developed ulcers, also   Lisinopril Other (See Comments)   Caused a body ache        Medication List     STOP taking these medications    atenolol-chlorthalidone 50-25 MG tablet Commonly known as: TENORETIC   gabapentin 600 MG tablet Commonly known as: NEURONTIN Replaced by: gabapentin 300 MG capsule       TAKE these medications    Accu-Chek Guide Me w/Device Kit Use to test blood sugars 1-2 times daily.   acetaminophen 325 MG tablet Commonly known as: TYLENOL Take 1-2 tablets (325-650 mg total) by mouth every 4 (four) hours as needed for mild pain.   apixaban 5 MG Tabs tablet Commonly known as: ELIQUIS Take 2 tablets (10 mg total) by mouth 2 (two) times  daily for 2 days.   apixaban 5 MG Tabs tablet Commonly known as: ELIQUIS Take 1 tablet (5 mg total) by mouth 2 (two) times daily. Start taking on: March 09, 2022   carvedilol 3.125 MG tablet Commonly known as: COREG Take 1 tablet (3.125 mg total) by mouth 2 (two) times daily with a meal.   cloNIDine 0.1 MG tablet Commonly known as: CATAPRES TAKE 1 TABLET (0.1 MG TOTAL) BY MOUTH DAILY.   DULoxetine 60 MG capsule Commonly known as: CYMBALTA TAKE 1 CAPSULE BY MOUTH EVERY DAY What changed: how much to take   gabapentin 300 MG capsule Commonly known as: NEURONTIN Take 1 capsule (300 mg total) by mouth 2 (two) times daily. Replaces: gabapentin 600 MG tablet    hydrocortisone 2.5 % rectal cream Commonly known as: ANUSOL-HC Place rectally 3 (three) times daily.   levETIRAcetam 250 MG tablet Commonly known as: KEPPRA TAKE 1 TABLET BY MOUTH TWICE A DAY   multivitamin with minerals Tabs tablet Take 1 tablet by mouth daily with breakfast.   naloxone 4 MG/0.1ML Liqd nasal spray kit Commonly known as: NARCAN 1 spray as needed (accidental overdose).   oxyCODONE-acetaminophen 7.5-325 MG tablet Commonly known as: PERCOCET Take 2 tablets by mouth every 6 (six) hours as needed for severe pain. Note that 120 pills were filled on 10/31--day prior to hospital admission. Taking this four times a day should be equal to using 10 mg three times a day as in the hospital. Can wean down as able. What changed: additional instructions   polyethylene glycol 17 g packet Commonly known as: MIRALAX / GLYCOLAX Take 17 g by mouth daily as needed for mild constipation.   Potassium 99 MG Tabs Take 99 mg by mouth daily.   simvastatin 40 MG tablet Commonly known as: ZOCOR TAKE 1 TABLET BY MOUTH EVERY DAY   torsemide 20 MG tablet Commonly known as: DEMADEX Take 1 tablet (20 mg total) by mouth every Monday, Wednesday, and Friday. Start taking on: Mar 07, 2022       Allergies  Allergen Reactions   Aspirin Other (See Comments)    Irritates the stomach and the patient developed ulcers, also   Lisinopril Other (See Comments)    Caused a body ache      The results of significant diagnostics from this hospitalization (including imaging, microbiology, ancillary and laboratory) are listed below for reference.    Significant Diagnostic Studies: DG Chest 2 View  Result Date: 02/28/2022 CLINICAL DATA:  Chest pain and weakness. EXAM: CHEST - 2 VIEW COMPARISON:  September 19, 2021 FINDINGS: There is stable mild to moderate severity enlargement of the cardiac silhouette. There is marked severity tortuosity of the descending thoracic aorta. Both lungs are clear. Thin,  curvilinear wire like material is seen overlying the lateral aspect of the mid left chest wall on the frontal view. There is mild to moderate severity dextroscoliosis of the mid to lower thoracic spine with multilevel degenerative changes. Postoperative changes are noted within the visualized portion of the upper lumbar spine. IMPRESSION: 1. Stable cardiomegaly with marked severity tortuosity of the descending thoracic aorta. 2. No acute or active cardiopulmonary disease. Electronically Signed   By: Virgina Norfolk M.D.   On: 02/28/2022 23:56   NM Pulmonary Perfusion  Result Date: 03/01/2022 CLINICAL DATA:  Pulmonary embolism suspected, high probability. EXAM: NUCLEAR MEDICINE PERFUSION LUNG SCAN TECHNIQUE: Perfusion images were obtained in multiple projections after intravenous injection of radiopharmaceutical. Ventilation scans intentionally deferred if perfusion scan and chest  x-ray adequate for interpretation during COVID 19 epidemic. RADIOPHARMACEUTICALS:  4.3 mCi Tc-73mMAA IV COMPARISON:  Chest radiography yesterday.  Chest CT 12/20/2020. FINDINGS: There are segmental perfusion defects in both lungs suspicious for pulmonary embolism. Likelihood is moderate probability to high probability. No lobar or greater perfusion defect. IMPRESSION: Bilateral segmental perfusion defects likely to indicate pulmonary embolism. Likelihood is moderate probability to high probability. Electronically Signed   By: MNelson ChimesM.D.   On: 03/01/2022 15:17   UKoreaAbdomen Complete  Result Date: 03/01/2022 CLINICAL DATA:  Acute kidney injury.  Elevated liver enzymes. EXAM: ABDOMEN ULTRASOUND COMPLETE COMPARISON:  None Available. FINDINGS: Gallbladder: No gallstones or wall thickening visualized. No sonographic Murphy sign noted by sonographer. Common bile duct: Diameter: 5-6 mm Liver: Coarsening of hepatic echotexture suggests fatty deposition. No focal intrahepatic parenchymal abnormality evident. Portal vein is patent on  color Doppler imaging with normal direction of blood flow towards the liver. IVC: No abnormality visualized. Pancreas: Visualized portion unremarkable. Spleen: Size and appearance within normal limits. Right Kidney: Length: 10.1 cm. Echogenicity within normal limits. No mass or hydronephrosis visualized. Left Kidney: Length: 12.2 cm . Echogenicity within normal limits. No mass or hydronephrosis visualized.17 mm simple cyst evident similar to CT scan from 06/26/2020. Abdominal aorta: Obscured by overlying bowel gas. Other findings: No evidence for intraperitoneal free fluid. IMPRESSION: 1. No acute findings. There is a small cyst in the left kidney, similar to CT scan from 06/26/2020. 2. Coarsening of hepatic echotexture suggests fatty deposition. Electronically Signed   By: EMisty StanleyM.D.   On: 03/01/2022 07:50   CARDIAC CATHETERIZATION  Result Date: 03/02/2022   The left ventricular systolic function is normal.   LV end diastolic pressure is mildly elevated.   The left ventricular ejection fraction is 50-55% by visual estimate.   Hemodynamic findings consistent with moderate pulmonary hypertension.   There is severe (4+) tricuspid regurgitation. CVTS consult for CTEPH. Pulmonary consult for moderate pulmonary HTN/CTEPH.   ECHOCARDIOGRAM COMPLETE  Result Date: 03/01/2022    ECHOCARDIOGRAM REPORT   Patient Name:   ADAGO JUNGWIRTHDate of Exam: 03/01/2022 Medical Rec #:  [id number]       Height:       72.0 in Accession #:    [id number]      Weight:       304.2 lb Date of Birth:  1Mar 19, 1952      BSA:          2.546 m Patient Age:    89years         BP:           104/70 mmHg Patient Gender: M                HR:           66 bpm. Exam Location:  Inpatient Procedure: 2D Echo, Cardiac Doppler and Color Doppler Indications:    CHF  History:        Patient has prior history of Echocardiogram examinations, most                 recent 06/27/2020. Risk Factors:Hypertension and Diabetes.  Sonographer:    CJefferey PicaReferring Phys: 1[place] Sonographer Comments: Patient is morbidly obese. Image acquisition challenging due to patient body habitus. IMPRESSIONS  1. Left ventricular ejection fraction, by estimation, is 55 to 60%. The left ventricle has normal function. The left ventricle demonstrates regional wall motion abnormalities (see scoring  diagram/findings for description). There is mild concentric left ventricular hypertrophy. Left ventricular diastolic parameters are consistent with Grade I diastolic dysfunction (impaired relaxation). There is the interventricular septum is flattened in systole and diastole, consistent with right ventricular pressure and volume overload. There is mild hypokinesis of the left ventricular, entire septal wall.  2. Right ventricular systolic function is severely reduced. The right ventricular size is severely enlarged. There is severely elevated pulmonary artery systolic pressure.  3. Left atrial size was mildly dilated.  4. Right atrial size was severely dilated.  5. The mitral valve is degenerative. Trivial mitral valve regurgitation.  6. Tricuspid valve regurgitation is moderate to severe.  7. The aortic valve is tricuspid. There is mild calcification of the aortic valve. There is mild thickening of the aortic valve. Aortic valve regurgitation is not visualized.  8. The inferior vena cava is normal in size with greater than 50% respiratory variability, suggesting right atrial pressure of 3 mmHg. FINDINGS  Left Ventricle: Left ventricular ejection fraction, by estimation, is 55 to 60%. The left ventricle has normal function. The left ventricle demonstrates regional wall motion abnormalities. Mild hypokinesis of the left ventricular, entire septal wall. The left ventricular internal cavity size was normal in size. There is mild concentric left ventricular hypertrophy. The interventricular septum is flattened in systole and diastole, consistent with right ventricular  pressure and volume overload. Left ventricular diastolic parameters are consistent with Grade I diastolic dysfunction (impaired relaxation). Right Ventricle: The right ventricular size is severely enlarged. Decreased right ventricular wall thickness. Right ventricular systolic function is severely reduced. There is severely elevated pulmonary artery systolic pressure. The tricuspid regurgitant velocity is 3.87 m/s, and with an assumed right atrial pressure of 15 mmHg, the estimated right ventricular systolic pressure is 62.6 mmHg. Left Atrium: Left atrial size was mildly dilated. Right Atrium: Right atrial size was severely dilated. Pericardium: There is no evidence of pericardial effusion. Mitral Valve: The mitral valve is degenerative in appearance. Trivial mitral valve regurgitation. Tricuspid Valve: The tricuspid valve is normal in structure. Tricuspid valve regurgitation is moderate to severe. Aortic Valve: The aortic valve is tricuspid. There is mild calcification of the aortic valve. There is mild thickening of the aortic valve. There is mild aortic valve annular calcification. Aortic valve regurgitation is not visualized. Aortic valve peak gradient measures 2.6 mmHg. Pulmonic Valve: The pulmonic valve was normal in structure. Pulmonic valve regurgitation is mild. Aorta: The aortic root is normal in size and structure. There is minimal (Grade I) atheroma plaque involving the aortic root and ascending aorta. Venous: The inferior vena cava is normal in size with greater than 50% respiratory variability, suggesting right atrial pressure of 3 mmHg. IAS/Shunts: The atrial septum is grossly normal.  LEFT VENTRICLE PLAX 2D LVIDd:         3.60 cm LVIDs:         2.40 cm LV PW:         1.30 cm LV IVS:        1.30 cm LVOT diam:     2.20 cm LVOT Area:     3.80 cm  IVC IVC diam: 3.10 cm LEFT ATRIUM             Index        RIGHT ATRIUM           Index LA diam:        3.40 cm 1.34 cm/m   RA Area:     26.00 cm LA Vol  (  A2C):   54.0 ml 21.21 ml/m  RA Volume:   101.00 ml 39.66 ml/m LA Vol (A4C):   64.8 ml 25.45 ml/m LA Biplane Vol: 61.9 ml 24.31 ml/m  AORTIC VALVE             PULMONIC VALVE AV Vmax:      80.90 cm/s PV Vmax:       0.39 m/s AV Peak Grad: 2.6 mmHg   PV Peak grad:  0.6 mmHg  AORTA Ao Root diam: 3.90 cm Ao Asc diam:  3.70 cm MITRAL VALVE               TRICUSPID VALVE MV Area (PHT): 3.46 cm    TR Peak grad:   59.9 mmHg MV Decel Time: 220 msec    TR Vmax:        387.00 cm/s MV E velocity: 41.10 cm/s MV A velocity: 63.20 cm/s  SHUNTS MV E/A ratio:  0.65        Systemic Diam: 2.20 cm Dixie Dials MD Electronically signed by Dixie Dials MD Signature Date/Time: 03/01/2022/4:41:24 PM    Final    VAS [place] LOWER EXTREMITY VENOUS (DVT)  Result Date: 03/04/2022  Lower Venous DVT Study Patient Name:  KENARD MORAWSKI  Date of Exam:   03/03/2022 Medical Rec #: [id number]         Accession #:    [id number] Date of Birth: 1951/07/09        Patient Gender: M Patient Age:   59 years Exam Location:  [facility] Procedure:      VAS [place] LOWER EXTREMITY VENOUS (DVT) Referring Phys: Nita Sells --------------------------------------------------------------------------------  Indications: Pulmonary embolism, history of DVT/PE.  Anticoagulation: Heparin. Limitations: Body habitus and poor ultrasound/tissue interface. Comparison Study: 06-27-2020 Prior lower extremity venous bilateral was positive                   for LT popliteal, posterior tibial, and peroneal vein DVT. Performing Technologist: Darlin Coco RDMS, RVT  Examination Guidelines: A complete evaluation includes B-mode imaging, spectral Doppler, color Doppler, and power Doppler as needed of all accessible portions of each vessel. Bilateral testing is considered an integral part of a complete examination. Limited examinations for reoccurring indications may be performed as noted. The reflux portion of the exam is performed with the patient in reverse  Trendelenburg.  +---------+---------------+---------+-----------+----------+--------------+ RIGHT    CompressibilityPhasicitySpontaneityPropertiesThrombus Aging +---------+---------------+---------+-----------+----------+--------------+ CFV      Full           No       Yes                                 +---------+---------------+---------+-----------+----------+--------------+ SFJ      Full                                                        +---------+---------------+---------+-----------+----------+--------------+ FV Prox  Full                                                        +---------+---------------+---------+-----------+----------+--------------+ FV Mid   Full                                                        +---------+---------------+---------+-----------+----------+--------------+  FV DistalFull                                                        +---------+---------------+---------+-----------+----------+--------------+ PFV      Full                                                        +---------+---------------+---------+-----------+----------+--------------+ POP      Full           No       Yes                                 +---------+---------------+---------+-----------+----------+--------------+ PTV      Full                                                        +---------+---------------+---------+-----------+----------+--------------+ PERO     Full                                                        +---------+---------------+---------+-----------+----------+--------------+   +---------+---------------+---------+-----------+----------+-------------------+ LEFT     CompressibilityPhasicitySpontaneityPropertiesThrombus Aging      +---------+---------------+---------+-----------+----------+-------------------+ CFV      Full           No       Yes                                       +---------+---------------+---------+-----------+----------+-------------------+ SFJ      Full                                                             +---------+---------------+---------+-----------+----------+-------------------+ FV Prox  Full                                                             +---------+---------------+---------+-----------+----------+-------------------+ FV Mid   Full                                                             +---------+---------------+---------+-----------+----------+-------------------+ FV DistalFull                                                             +---------+---------------+---------+-----------+----------+-------------------+  PFV      Full                                                             +---------+---------------+---------+-----------+----------+-------------------+ POP      Full           No       Yes                                      +---------+---------------+---------+-----------+----------+-------------------+ PTV      Full                                         Some segments not                                                         well visualized     +---------+---------------+---------+-----------+----------+-------------------+ PERO     Full                                         Some segments not                                                         well visualized     +---------+---------------+---------+-----------+----------+-------------------+     Summary: RIGHT: - There is no evidence of deep vein thrombosis in the lower extremity.  - No cystic structure found in the popliteal fossa.  LEFT: - There is no evidence of deep vein thrombosis in the lower extremity. However, portions of this examination were limited- see technologist comments above.  - No cystic structure found in the popliteal fossa.  *See table(s) above for measurements and  observations. Electronically signed by Servando Snare MD on 03/04/2022 at 4:22:02 PM.    Final     Microbiology: Recent Results (from the past 240 hour(s))  MRSA Next Gen by PCR, Nasal     Status: None   Collection Time: 03/01/22  4:23 AM   Specimen: Nasal Mucosa; Nasal Swab  Result Value Ref Range Status   MRSA by PCR Next Gen NOT DETECTED NOT DETECTED Final    Comment: (NOTE) The GeneXpert MRSA Assay (FDA approved for NASAL specimens only), is one component of a comprehensive MRSA colonization surveillance program. It is not intended to diagnose MRSA infection nor to guide or monitor treatment for MRSA infections. Test performance is not FDA approved in patients less than 36 years old. Performed at [facility], [place] [address], [place], [place] [address]      Labs: Basic Metabolic Panel: Recent Labs  Lab 03/02/22 0054 03/02/22 1203 03/02/22 1215 03/02/22 1850 03/03/22 0055 03/04/22 0113 03/05/22 0034  NA  139   < > 140  141 136 136 139 137  K 3.7   < > 3.4*  3.4* 4.8 3.4* 3.6 3.6  CL 100  --   --  99 97* 101 99  CO2 27  --   --  '26 29 29 31  ' GLUCOSE 131*  --   --  154* 128* 136* 143*  BUN 52*  --   --  46* 45* 30* 25*  CREATININE 2.41*  --   --  2.12* 2.10* 1.47* 1.60*  CALCIUM 9.1  --   --  9.1 9.3 9.7 10.0  PHOS 3.2  --   --   --   --   --  3.0   < > = values in this interval not displayed.   Liver Function Tests: Recent Labs  Lab 02/28/22 2344 03/01/22 0711 03/02/22 0054 03/02/22 1850 03/04/22 0113 03/05/22 0034  AST 87* 61*  --  84* 30  --   ALT 85* 78*  --  72* 55*  --   ALKPHOS 130* 109  --  97 106  --   BILITOT 1.5* 1.2  --  1.0 0.4  --   PROT 7.9 7.7  --  7.1 7.6  --   ALBUMIN 3.8 3.7 3.6 3.3* 3.5 3.5   No results for input(s): LIPASE, AMYLASE in the last 168 hours. No results for input(s): AMMONIA in the last 168 hours. CBC: Recent Labs  Lab 02/28/22 2344 03/02/22 0054 03/02/22 1203 03/02/22 1215 03/02/22 1850 03/03/22 0055  03/04/22 0113 03/05/22 0034  WBC 8.5 6.9  --   --  6.8 7.4 5.1 6.2  NEUTROABS 5.5  --   --   --   --   --  3.0 4.1  HGB 12.3* 10.5*   < > 11.6*  11.6* 10.9* 10.4* 10.6* 11.2*  HCT 38.3* 33.1*   < > 34.0*  34.0* 32.8* 32.7* 33.0* 34.3*  MCV 97.7 97.4  --   --  95.9 97.9 97.6 96.3  PLT 281 221  --   --  222 243 223 232   < > = values in this interval not displayed.   Cardiac Enzymes: No results for input(s): CKTOTAL, CKMB, CKMBINDEX, TROPONINI in the last 168 hours. BNP: BNP (last 3 results) Recent Labs    02/28/22 2344  BNP 1,128.1*    ProBNP (last 3 results) No results for input(s): PROBNP in the last 8760 hours.  CBG: No results for input(s): GLUCAP in the last 168 hours.     Signed:  Nita Sells MD   [facility] 03/06/2022, 8:40 AM

## 2022-03-06 NOTE — Progress Notes (Signed)
Heart Failure Navigation Team Progress Note  PCP: [name], [name], MD Primary Cardiologist: N/A Admitted from: home  Past Medical History:  Diagnosis Date   Allergy    Chronic back pain    Diabetes mellitus without complication ([place])    DNR (do not resuscitate) 11/19/2020   Duodenal ulcer hemorrhage 08/29/2014   ED (erectile dysfunction)    Esophageal stricture 08/30/2014   Glucose intolerance (impaired glucose tolerance)    Hiatal hernia 08/30/2014   Hypertension    Hypokalemia 04/11/2013   MRSA carrier 08/30/2014   Obesity    Osteoarthritis    Pneumonia    Pulmonary embolism ([place]) 06/2020   Scoliosis 2016   Spinal stenosis of lumbar region    Urinary tract infection 04/11/2013    Social History   Socioeconomic History   Marital status: Married    Spouse name: Not on file   Number of children: Not on file   Years of education: Not on file   Highest education level: Not on file  Occupational History   Occupation: retired  Tobacco Use   Smoking status: Former    Packs/day: 1.00    Years: 25.00    Pack years: 25.00    Types: Cigarettes    Quit date: 1995    Years since quitting: 28.4   Smokeless tobacco: Never  Vaping Use   Vaping Use: Never used  Substance and Sexual Activity   Alcohol use: Not Currently    Comment: holidays and special occasions   Drug use: Yes    Types: Oxycodone   Sexual activity: Yes    Birth control/protection: None  Other Topics Concern   Not on file  Social History Narrative   Married.     Social Determinants of Health   Financial Resource Strain: Low Risk    Difficulty of Paying Living Expenses: Not hard at all  Food Insecurity: No Food Insecurity   Worried About Charity fundraiser in the Last Year: Never true   [place] in the Last Year: Never true  Transportation Needs: No Transportation Needs   Lack of Transportation (Medical): No   Lack of Transportation (Non-Medical): No  Physical Activity: Sufficiently  Active   Days of Exercise per Week: 7 days   Minutes of Exercise per Session: 30 min  Stress: No Stress Concern Present   Feeling of Stress : Not at all  Social Connections: Not on file     Heart & Vascular Transition of Care Clinic follow-up: Scheduled for 03/14/22'@3pm'$ .  Confirmed transportation.  Immediate social needs: N/A for immediate needs and [place] follow up appointment  HF CSW spoke with Mr. Duecker at bedside and provided him with an appointment card for the [facility] outpatient clinic and encouraged him to follow up and to attend the appointment and bring his medications and if anything changes to please reach out so that CSW/HV clinic team can provide support. Mr. Montini reported that he has great support including his wife and children.   Aiyah Scarpelli, MSW, LCSW [phone] Heart Failure Social Worker

## 2022-03-06 NOTE — Progress Notes (Signed)
Heart Failure Stewardship Pharmacist Progress Note   PCP: [name], [name], MD PCP-Cardiologist: None    HPI:  71 yo AAM with PMH of HTN, T2DM, CKD II, morbid obesity, saddle PE in 06/2020 on Eliquis, seizures, diastolic CHF, and multiple back surgeries.  He presented to [facility] ED 05/24 with weakness, dyspnea and chest pain for a week.  His CXR showed stable cardiomegaly and clear lungs and the pulmonary perfusion scan confirmed chronic PE.  Cardiology was consulted and Echo on 05/25 showed preserved LVEF of 55-60% (slightly reduced from 60-65% in September 2021); RV function now severely reduced (only moderate in Sept 2021) with severely elevated pulmonary pressures, biatrial dilation and moderate-severe TR.  The mild LVH, G1DD and mild hypokinesis were unchanged.   He was taken for a R/LHC on 05/26 which revealed clear coronaries and significant pulmonary HTN with mean PAP of 39 and PCWP 40, preserved cardiac output (Fick CI 2.33), and some volume overload with mean RA pressure of 12.  He responded well to two doses of IV Lasix on the 25th and 28th and was transitioned to torsemide 20 mg MWF on 05/29.     Current HF Medications: Diuretic: torsemide 20 mg MWF Beta Blocker: carvedilol 3.125 mg twice daily  Prior to admission HF Medications: None  Pertinent Lab Values: Serum creatinine 1.6, BUN 25, Potassium 3.6, Sodium 137, BNP 1128, Magnesium 1.8, A1c 6.6%  Vital Signs: Weight: 304 lbs (admission weight: 279 lbs) Blood pressure: 110-20s/80s  Heart rate: 70-80s  I/O: -1 L yesterday; net -3.167 L   Medication Assistance / Insurance Benefits Check: Does the patient have prescription insurance?  Yes Type of insurance plan: [facility] Medicare    Outpatient Pharmacy:  [facility] Is the patient willing to use [facility] at discharge? Yes Is the patient willing to transition their outpatient pharmacy to utilize a [facility] outpatient pharmacy?   No     Assessment: 1. Acute on chronic diastolic CHF (LVEF 90-24%). NYHA class III symptoms. - Continue torsemide 20 mg MWF - Continue carvedilol 3.125 mg BID - Acute on chronic kidney injury has limited GDMT initiation (no MRA, SGLT2i, RAAS agent yet)   Plan: 1) Medication changes recommended at this time: - At HF TOC visit 06/07, assess BP/SCr.   - Start Entresto 24-26 mg BID if renal function and BP allow - Can start Farxiga 10 mg daily if BP is soft (eGFR 40 at discharge) - Transition carvedilol to Toprol 12.5 mg daily if need room on BP  2) Patient assistance: - none pending  3)  Education  - Patient has been educated on current HF medications and potential additions to HF medication regimen - Patient verbalizes understanding that over the next few months, these medication doses may change and more medications may be added to optimize HF regimen - Patient has been educated on basic disease state pathophysiology and goals of therapy  Laurey Arrow, PharmD PGY1 Pharmacy Resident 03/06/2022  8:05 AM

## 2022-03-06 NOTE — Progress Notes (Signed)
SATURATION QUALIFICATIONS: (This note is used to comply with regulatory documentation for home oxygen)  Patient Saturations on Room Air at Rest = 88%  Patient Saturations on Room Air while Ambulating = 84%  Patient Saturations on 2 Liters of oxygen while Ambulating = 91%  Please briefly explain why patient needs home oxygen:Pt not able to maintain Sp02 on room air with activity requiring supplemental 02.   Marisa Severin, PT, DPT Acute Rehabilitation Services Secure chat preferred Office [phone]

## 2022-03-06 NOTE — TOC Initial Note (Addendum)
Transition of Care [facility]) - Initial/Assessment Note    Patient Details  Name: MAISON KESTENBAUM MRN: [id number] Date of Birth: 10-May-1951  Transition of Care [facility]) CM/SW Contact:    Angelita Ingles, RN Phone Number:[phone]  03/06/2022, 10:08 AM  Clinical Narrative:                 [facility] consulted for patient with DME home O2 and HH needs. Patient is high risk for readmission. CM at bedside to assess patient. Patient states that he is from home where he lives with his spouse. Patient states that he functions independently at home but does have DME on an as needed basis. DME includes ( hospital bed, wheelchair, [facility], walker & rollator) Patient reports that his PCP is Dr. Shellia Cleverly. Patient states that he is an active patient and follows up as directed. [facility] Patient reports that he has access to medications and that they are affordable. CM has confirmed that patient is active with [facility] for home health and Latricia Heft will resume services once discharged. AVS has been updated. Home O2 has been ordered. Referral has been called to [facility] and portable tank will be delivered to the room. No other needs noted at this time. TOC will sign off.   TOC received message from Amy with [facility] stating that patients episodes of care ended last week and that [facility] is unable to take patient back for services. CM has sent referral to [facility] acceptance pending.   Expected Discharge Plan: [place] Barriers to Discharge: No Barriers Identified   Patient Goals and CMS Choice Patient states their goals for this hospitalization and ongoing recovery are:: Wants to get better to go home. CMS Medicare.gov Compare Post Acute Care list provided to:: Patient Choice offered to / list presented to : Patient  Expected Discharge Plan and Services Expected Discharge Plan: [place] In-house Referral: NA Discharge Planning Services: CM Consult Post  Acute Care Choice: [place] arrangements for the past 2 months: Single Family Home Expected Discharge Date: 03/06/22               DME Arranged: Oxygen DME Agency: [facility] Date DME Agency Contacted: 03/06/22 Time DME Agency Contacted: 59 Representative spoke with at DME Agency: [facility] HH Arranged: PT, OT [place] Agency: [place] Date [place]: 03/06/22 Time [place]: 1007 Representative spoke with at [place]: Amy patient is currently active with [facility]  Prior Living Arrangements/Services Living arrangements for the past 2 months: Single Family Home Lives with:: Spouse Patient language and need for interpreter reviewed:: Yes Do you feel safe going back to the place where you live?: Yes      Need for Family Participation in Patient Care: Yes (Comment) Care giver support system in place?: Yes (comment) Current home services: DME (wheelchair, walker, BSC, rollator) Criminal Activity/Legal Involvement Pertinent to Current Situation/Hospitalization: No - Comment as needed  Activities of Daily Living Home Assistive Devices/Equipment: Eyeglasses, Gilford Rile (specify type) ADL Screening (condition at time of admission) Patient's cognitive ability adequate to safely complete daily activities?: Yes Is the patient deaf or have difficulty hearing?: No Does the patient have difficulty seeing, even when wearing glasses/contacts?: No Does the patient have difficulty concentrating, remembering, or making decisions?: No Patient able to express need for assistance with ADLs?: Yes Does the patient have difficulty dressing or bathing?: Yes Independently performs ADLs?: Yes (appropriate for developmental age) Does the patient  have difficulty walking or climbing stairs?: Yes Weakness of Legs: Both Weakness of Arms/Hands: None  Permission Sought/Granted Permission sought to share information with : Family Supports Permission granted to share information  with : No              Emotional Assessment Appearance:: Appears stated age Attitude/Demeanor/Rapport: Gracious Affect (typically observed): Accepting, Pleasant Orientation: : Oriented to Self, Oriented to Place, Oriented to  Time, Oriented to Situation Alcohol / Substance Use: Not Applicable Psych Involvement: No (comment)  Admission diagnosis:  SOB (shortness of breath) [R06.02] Acute renal failure superimposed on chronic kidney disease, unspecified CKD stage, unspecified acute renal failure type ([place]) [N17.9, N18.9] Heart failure, unspecified HF chronicity, unspecified heart failure type ([place]) [I50.9] Patient Active Problem List   Diagnosis Date Noted   Chest pain 03/01/2022   Elevated liver enzymes 03/01/2022   Myofascial pain 10/30/2021   Controlled type 2 diabetes mellitus with hyperglycemia, without long-term current use of insulin (HCC)    Bilateral leg weakness 08/08/2021   CAP (community acquired pneumonia) 11/19/2020   Loculated pleural effusion 11/19/2020   DNR (do not resuscitate) 11/19/2020   Closed right ankle fracture    Labile blood glucose    Hypokalemia    Drug induced constipation    Debility    Hyponatremia    Diabetic peripheral neuropathy (HCC)    Acute blood loss anemia    Chronic pain syndrome    Postoperative pain    Closed right trimalleolar fracture 07/02/2020   Acute respiratory failure with hypoxia (HCC)    Lumbar spinal stenosis 06/01/2020   Spinal cord compression ([place]) 04/02/2020   Thoracic myelopathy 07/25/2018   Hyperlipidemia associated with type 2 diabetes mellitus ([place]) 11/14/2016   Type 2 diabetes mellitus with diabetic neuropathy, unspecified ([place]) 11/14/2016   Routine general medical examination at a health care facility 11/14/2015   Hiatal hernia 08/30/2014   Acute renal failure superimposed on stage 3a chronic kidney disease ([place]) 04/10/2013   Allergic rhinitis 04/10/2013   Spinal stenosis of lumbar region 11/06/2012    Allergic rhinitis due to pollen 01/15/2011   Osteoarthritis 11/02/2008   HIP PAIN, LEFT 02/09/2008   Class 2 obesity due to excess calories with body mass index (BMI) of 36.0 to 36.9 in adult 11/07/2007   ERECTILE DYSFUNCTION, MILD 11/07/2007   Essential hypertension 11/07/2007   PCP:  [name], [name], MD [id number] [place] [place] [address] [place] [address]Phone: [phone]: [phone] [facility] [address]Phone: [phone]: [phone]    Social Determinants of Health (SDOH) Interventions    Readmission Risk Interventions    03/06/2022    9:59 AM  Readmission Risk Prevention Plan  Transportation Screening Complete  PCP or Specialist Appt within 3-5 Days Complete  [place] or [place]  Social Work Consult for [place]ady Cove[place]anning/Counseling Complete  Palliative Care Screening Not Applicable  Medication Review (Press photographer Referral to Pharmacy

## 2022-03-06 NOTE — Discharge Instructions (Signed)
Information on my medicine - ELIQUIS (apixaban)  Why was Eliquis prescribed for you? Eliquis was prescribed to treat blood clots that may have been found in the veins of your legs (deep vein thrombosis) or in your lungs (pulmonary embolism) and to reduce the risk of them occurring again.  What do You need to know about Eliquis ? Take 10 mg (two 5 mg tablets) taken TWICE daily for 2 days DAYS, then on 6/2 the dose is reduced to ONE 5 mg tablet taken TWICE daily.  Eliquis may be taken with or without food.   Try to take the dose about the same time in the morning and in the evening. If you have difficulty swallowing the tablet whole please discuss with your pharmacist how to take the medication safely.  Take Eliquis exactly as prescribed and DO NOT stop taking Eliquis without talking to the doctor who prescribed the medication.  Stopping may increase your risk of developing a new blood clot.  Refill your prescription before you run out.  After discharge, you should have regular check-up appointments with your healthcare provider that is prescribing your Eliquis.    What do you do if you miss a dose? If a dose of ELIQUIS is not taken at the scheduled time, take it as soon as possible on the same day and twice-daily administration should be resumed. The dose should not be doubled to make up for a missed dose.  Important Safety Information A possible side effect of Eliquis is bleeding. You should call your healthcare provider right away if you experience any of the following: Bleeding from an injury or your nose that does not stop. Unusual colored urine (red or dark brown) or unusual colored stools (red or black). Unusual bruising for unknown reasons. A serious fall or if you hit your head (even if there is no bleeding).  Some medicines may interact with Eliquis and might increase your risk of bleeding or clotting while on Eliquis. To help avoid this, consult your healthcare provider  or pharmacist prior to using any new prescription or non-prescription medications, including herbals, vitamins, non-steroidal anti-inflammatory drugs (NSAIDs) and supplements.  This website has more information on Eliquis (apixaban): http://[email or web address].com/eliquis/home

## 2022-03-06 NOTE — Evaluation (Signed)
Physical Therapy Evaluation Patient Details Name: Keith [name] MRN: [id number] DOB: 1951-04-28 Today's Date: 03/06/2022  History of Present Illness  Patient is a 71 y/o male who presents on 02/28/22 with SOB and CP. VQ scan 5/25 probable PE. Admitted with acute hypoxic respiratory failure. ECHO- severe tricuspid regurgitation, increased pulmonary pressures and decreased Rt ventricle function. Possible tx to tertiary center? PMH includes back surgery, DM, HTN, PE, obesity, Chronic pain secondary to cervicalgia on opiates, depression.  Clinical Impression  Patient presents with generalized weakness, chronic back pain, impaired balance, dyspnea on exertion, decreased cardiopulmonary endurance and impaired mobility s/p above. Pt lives at home with wife and reports using RW for household ambulation and w/c for community ambulation PTA. Requires assist with ADLs since back surgery in Nov 2022 and has been working with [place]. Today, pt requires Mod A for bed mobility and Min-moD A for transfers. Able to initiate gait training with Mod A and use of RW for support. Sp02 dropped to mid 80s on RA, donned 02 and able to maintain Sp02 in 90s with activity.  HR as low as high 30s, not sure of accuracy. Pt declines wanting to go to SNF. Recommend HHPT to address strengthening, balance and overall safe mobility. Will follow acutely to maximize independence and mobility prior to return home.     Recommendations for follow up therapy are one component of a multi-disciplinary discharge planning process, led by the attending physician.  Recommendations may be updated based on patient status, additional functional criteria and insurance authorization.  Follow Up Recommendations Home health PT    Assistance Recommended at Discharge Frequent or constant Supervision/Assistance  Patient can return home with the following  A lot of help with walking and/or transfers;A lot of help with bathing/dressing/bathroom;Help with  stairs or ramp for entrance;Assist for transportation;Assistance with cooking/housework    Equipment Recommendations None recommended by PT  Recommendations for Other Services       Functional Status Assessment Patient has had a recent decline in their functional status and demonstrates the ability to make significant improvements in function in a reasonable and predictable amount of time.     Precautions / Restrictions Precautions Precautions: Fall;Other (comment) Precaution Comments: watch 02 Restrictions Weight Bearing Restrictions: No      Mobility  Bed Mobility Overal bed mobility: Needs Assistance Bed Mobility: Rolling, Sidelying to Sit Rolling: Min guard Sidelying to sit: Mod assist, HOB elevated       General bed mobility comments: using leg lifter to move BLEs to EOB, increased time, use of rail and assist with trunk to get to EOB.    Transfers Overall transfer level: Needs assistance Equipment used: Rolling walker (2 wheels) Transfers: Sit to/from Stand, Bed to chair/wheelchair/BSC Sit to Stand: Mod assist, Min assist, From elevated surface   Step pivot transfers: Min assist, +2 safety/equipment       General transfer comment: Mod A initially to stand from EOB with cues for hand placement, progressing to Min A on second attempt. Step pivot to chair with Min A for RW management.    Ambulation/Gait Ambulation/Gait assistance: Mod assist Gait Distance (Feet): 6 Feet (forwards/backwards) Assistive device: Rolling walker (2 wheels) Gait Pattern/deviations: Step-to pattern, Step-through pattern, Decreased stride length, Shuffle, Trunk flexed, Wide base of support Gait velocity: decreased     General Gait Details: Slow, effortful gait with increased WB through BUEs and difficulty moving RW, decreased knee flexion bilaterally during swing phase. 2/4 DOE. Sp02 dropped to mid 80s on RA, donned  02 and able to maintain in 90s.  Stairs            Wheelchair  Mobility    Modified Rankin (Stroke Patients Only)       Balance Overall balance assessment: Needs assistance Sitting-balance support: Feet supported, Bilateral upper extremity supported Sitting balance-Leahy Scale: Poor Sitting balance - Comments: Requires UE support sitting EOB   Standing balance support: During functional activity, Reliant on assistive device for balance, Bilateral upper extremity supported Standing balance-Leahy Scale: Poor Standing balance comment: Requires Ue support in standing as well as c lose Min guard                             Pertinent Vitals/Pain Pain Assessment Pain Assessment: Faces Faces Pain Scale: Hurts even more Pain Location: LEs, back Pain Descriptors / Indicators: Sore, Discomfort Pain Intervention(s): Monitored during session, Repositioned    Home Living Family/patient expects to be discharged to:: Private residence Living Arrangements: Spouse/significant other Available Help at Discharge: Family;Available 24 hours/day Type of Home: House Home Access: Ramped entrance Entrance Stairs-Rails: Right   Alternate Level Stairs-Number of Steps: 11 Home Layout: Two level;1/2 bath on main level;Able to live on main level with bedroom/bathroom Home Equipment: Hospital bed;Rolling [place] (2 wheels);Rollator (4 wheels);Shower seat;BSC/3in1;Wheelchair - manual Additional Comments: hospital bed is downstairs    Prior Function Prior Level of Function : Needs assist             Mobility Comments: using RW for household ambulation and w/c for community ambulation, no falls. Was working with [place] post back surgery. ADLs Comments: Wife assists with ADLs. Plans to stay downstairs in hospital bed and plans on getting stair lift put into home     Hand Dominance   Dominant Hand: Right    Extremity/Trunk Assessment   Upper Extremity Assessment Upper Extremity Assessment: Defer to OT evaluation    Lower Extremity  Assessment Lower Extremity Assessment: Generalized weakness;RLE deficits/detail;LLE deficits/detail RLE Deficits / Details: decreased sensation BLEs distal to knee RLE Sensation: decreased light touch;decreased proprioception LLE Deficits / Details: decreased sensation BLEs distal to knee LLE Sensation: decreased light touch;decreased proprioception    Cervical / Trunk Assessment Cervical / Trunk Assessment: Back Surgery  Communication   Communication: No difficulties  Cognition Arousal/Alertness: Awake/alert Behavior During Therapy: WFL for tasks assessed/performed Overall Cognitive Status: Within Functional Limits for tasks assessed                                          General Comments General comments (skin integrity, edema, etc.): Sp02 dropped to mid 80s on RA, donned 02 and able to maintain Sp02 in 90s with activity.  HR as low as high 30s, not sure of accuracy.    Exercises     Assessment/Plan    PT Assessment Patient needs continued PT services  PT Problem List Decreased strength;Decreased mobility;Pain;Impaired sensation;Decreased balance;Decreased activity tolerance;Cardiopulmonary status limiting activity       PT Treatment Interventions Therapeutic activities;DME instruction;Gait training;Therapeutic exercise;Balance training;Functional mobility training;Patient/family education    PT Goals (Current goals can be found in the Care Plan section)  Acute Rehab PT Goals Patient Stated Goal: to go home and get stronger PT Goal Formulation: With patient Time For Goal Achievement: 03/20/22 Potential to Achieve Goals: Fair    Frequency Min 3X/week     Co-evaluation  AM-PAC PT "6 Clicks" Mobility  Outcome Measure Help needed turning from your back to your side while in a flat bed without using bedrails?: A Little Help needed moving from lying on your back to sitting on the side of a flat bed without using bedrails?: A Lot Help  needed moving to and from a bed to a chair (including a wheelchair)?: A Lot Help needed standing up from a chair using your arms (e.g., wheelchair or bedside chair)?: A Lot Help needed to walk in hospital room?: Total Help needed climbing 3-5 steps with a railing? : Total 6 Click Score: 11    End of Session Equipment Utilized During Treatment: Gait belt;Oxygen Activity Tolerance: Treatment limited secondary to medical complications (Comment) (decreased 02 sats) Patient left: in chair;with call bell/phone within reach;with chair alarm set Nurse Communication: Mobility status;Other (comment) (tx technique, need for 02) PT Visit Diagnosis: Muscle weakness (generalized) (M62.81);Unsteadiness on feet (R26.81);Difficulty in walking, not elsewhere classified (R26.2)    Time: 6712-4580 PT Time Calculation (min) (ACUTE ONLY): 31 min   Charges:   PT Evaluation $PT Eval Moderate Complexity: 1 Mod PT Treatments $Therapeutic Activity: 8-22 mins        Marisa Severin, PT, DPT Acute Rehabilitation Services Secure chat preferred Office [place] 03/06/2022, 9:04 AM

## 2022-03-06 NOTE — Evaluation (Addendum)
Occupational Therapy Evaluation Patient Details Name: Keith [name] MRN: [id number] DOB: 01/25/1951 Today's Date: 03/06/2022   History of Present Illness Patient is a 71 y/o male who presents on 02/28/22 with SOB and CP. VQ scan 5/25 probable PE. Admitted with acute hypoxic respiratory failure. ECHO- severe tricuspid regurgitation, increased pulmonary pressures and decreased Rt ventricle function. Possible tx to tertiary center? PMH includes back surgery, DM, HTN, PE, obesity, Chronic pain secondary to cervicalgia on opiates, depression.   Clinical Impression   PTA patient reports using RW for mobility at home and [facility] in community, requires assist from spouse for ADLS as needed (reports able to complete with AE and increased time, but she typically assists). Admitted for above and presents with problem list below, including generalized weakness, decreased activity tolerance, impaired balance, and decreased cardiopulmonary status. Pt on 2L [place] during session with VSS. He currently requires setup for seated UB ADLs, max assist for LB ADLs, and mod assist for sit to stand from recliner.  He reports plan to use 3:1 at home and basin bathing, as shower is on the 2nd floor. Pt declining SNF, recommend home as long as spouse can physically assist patient during transfers. Based on performance today,  will benefit from further OT services acutely and after dc at [facility] level to optimize independence, safety and return to PLOF.      Recommendations for follow up therapy are one component of a multi-disciplinary discharge planning process, led by the attending physician.  Recommendations may be updated based on patient status, additional functional criteria and insurance authorization.   Follow Up Recommendations  Home health OT    Assistance Recommended at Discharge Frequent or constant Supervision/Assistance  Patient can return home with the following A lot of help with walking and/or transfers;A lot of  help with bathing/dressing/bathroom;Assistance with cooking/housework;Assist for transportation;Help with stairs or ramp for entrance    Functional Status Assessment  Patient has had a recent decline in their functional status and demonstrates the ability to make significant improvements in function in a reasonable and predictable amount of time.  Equipment Recommendations  None recommended by OT    Recommendations for Other Services       Precautions / Restrictions Precautions Precautions: Fall;Other (comment) Precaution Comments: watch 02 Restrictions Weight Bearing Restrictions: No      Mobility Bed Mobility               General bed mobility comments: OOB in recliner upon entry    Transfers                          Balance Overall balance assessment: Needs assistance Sitting-balance support: No upper extremity supported, Feet supported Sitting balance-Leahy Scale: Fair Sitting balance - Comments: supervision dynamcailly in recliner during ADLS   Standing balance support: Bilateral upper extremity supported, During functional activity Standing balance-Leahy Scale: Poor Standing balance comment: relies on BUE and external support                           ADL either performed or assessed with clinical judgement   ADL Overall ADL's : Needs assistance/impaired     Grooming: Set up;Sitting           Upper Body Dressing : Set up;Sitting   Lower Body Dressing: Maximal assistance;Sit to/from stand Lower Body Dressing Details (indicate cue type and reason): require max assist today, typically uses AE or has assist  from spouse.  Mod assist to stand from recliner             Functional mobility during ADLs: Moderate assistance;Rolling walker (2 wheels);Cueing for safety General ADL Comments: pt limited by weakness, decreased activity tolerance, impaired balance     Vision   Vision Assessment?: No apparent visual deficits      Perception     Praxis      Pertinent Vitals/Pain Pain Assessment Pain Assessment: Faces Faces Pain Scale: Hurts even more Pain Location: LEs, back Pain Descriptors / Indicators: Sore, Discomfort Pain Intervention(s): Limited activity within patient's tolerance, Monitored during session, Repositioned     Hand Dominance Right   Extremity/Trunk Assessment Upper Extremity Assessment Upper Extremity Assessment: Generalized weakness   Lower Extremity Assessment Lower Extremity Assessment: Defer to PT evaluation RLE Deficits / Details: decreased sensation BLEs distal to knee RLE Sensation: decreased light touch;decreased proprioception LLE Deficits / Details: decreased sensation BLEs distal to knee LLE Sensation: decreased light touch;decreased proprioception   Cervical / Trunk Assessment Cervical / Trunk Assessment: Back Surgery   Communication Communication Communication: No difficulties   Cognition Arousal/Alertness: Awake/alert Behavior During Therapy: WFL for tasks assessed/performed Overall Cognitive Status: Within Functional Limits for tasks assessed                                       General Comments  pt on 2L with VSS, fatigues easily    Exercises     Shoulder Instructions      Home Living Family/patient expects to be discharged to:: Private residence Living Arrangements: Spouse/significant other Available Help at Discharge: Family;Available 24 hours/day Type of Home: House Home Access: Ramped entrance   Entrance Stairs-Rails: Right Home Layout: Two level;1/2 bath on main level;Able to live on main level with bedroom/bathroom Alternate Level Stairs-Number of Steps: 11 Alternate Level Stairs-Rails: Right Bathroom Shower/Tub: Walk-in shower;Tub/shower unit (sponge bathing in half bath, or goes to handicapped facility)   Bathroom Toilet: Handicapped height Bathroom Accessibility: Yes   [place] Hospital bed;Rolling [place] (2  wheels);Rollator (4 wheels);Shower seat;BSC/3in1;Wheelchair - Scientist, physiological: Reacher;Sock aid;Long-handled Conservation officer, historic buildings;Other (Comment) (leg lifter) Additional Comments: hospital bed is downstairs      Prior Functioning/Environment Prior Level of Function : Needs assist             Mobility Comments: using RW for household ambulation and w/c for community ambulation, no falls. Was working with [place] post back surgery. ADLs Comments: Wife assists with ADLs, as needed- reports using reacher, sock aide, long shoe horn with increased time. Plans to stay downstairs in hospital bed and plans on getting stair lift put into home        OT Problem List: Decreased strength;Impaired balance (sitting and/or standing);Decreased activity tolerance;Decreased knowledge of use of DME or AE;Decreased knowledge of precautions;Obesity;Cardiopulmonary status limiting activity      OT Treatment/Interventions: Self-care/ADL training;Therapeutic exercise;DME and/or AE instruction;Therapeutic activities;Balance training;Patient/family education;Energy conservation    OT Goals(Current goals can be found in the care plan section) Acute Rehab OT Goals Patient Stated Goal: home today OT Goal Formulation: With patient Time For Goal Achievement: 03/20/22 Potential to Achieve Goals: Good  OT Frequency: Min 2X/week    Co-evaluation              AM-PAC OT "6 Clicks" Daily Activity     Outcome Measure Help from another person eating meals?: None Help from another person  taking care of personal grooming?: A Little Help from another person toileting, which includes using toliet, bedpan, or urinal?: A Lot Help from another person bathing (including washing, rinsing, drying)?: A Lot Help from another person to put on and taking off regular upper body clothing?: A Little Help from another person to put on and taking off regular lower body clothing?: A Lot 6 Click Score: 16   End of  Session Equipment Utilized During Treatment: Rolling walker (2 wheels);Oxygen (2L) Nurse Communication: Mobility status  Activity Tolerance: Patient tolerated treatment well Patient left: in chair;with call bell/phone within reach;with chair alarm set  OT Visit Diagnosis: Other abnormalities of gait and mobility (R26.89);Muscle weakness (generalized) (M62.81)                Time: 8657-8469 OT Time Calculation (min): 12 min Charges:  OT General Charges $OT Visit: 1 Visit OT Evaluation $OT Eval Moderate Complexity: [place], OT Acute Rehabilitation Services Office [place] 03/06/2022, 10:08 AM

## 2022-03-07 ENCOUNTER — Telehealth: Payer: Self-pay

## 2022-03-07 NOTE — Telephone Encounter (Signed)
TCM not needed due to [facility]

## 2022-03-07 NOTE — TOC Transition Note (Signed)
Transition of Care [facility]) - CM/SW Discharge Note   Patient Details  Name: Keith [name] MRN: [id number] Date of Birth: 1951/01/08  Transition of Care [facility]) CM/SW Contact:  Angelita Ingles, RN Phone Number:[phone]  03/07/2022, 9:12 AM   Clinical Narrative:    CM spoke with Amy at [facility]. [facility] will restart home health services for the patient. Latricia He[facility] has already reached out to patient to provide him with contact information and a start of care date.      Barriers to Discharge: No Barriers Identified   Patient Goals and CMS Choice Patient states their goals for this hospitalization and ongoing recovery are:: Wants to get better to go home. CMS Medicare.gov Compare Post Acute Care list provided to:: Patient Choice offered to / list presented to : Patient  Discharge Placement                       Discharge Plan and Services In-house Referral: NA Discharge Planning Services: CM Consult Post Acute Care Choice: Home Health          DME Arranged: Oxygen DME Agency: [employer] Date DME Agency Contacted: 03/06/22 Time DME Agency Contacted: 41 Representative spoke with at DME Agency: [place]: PT, OT [place] Agency: [place] Date [place]: 03/06/22 Time [place]: 1007 Representative spoke with at [place]: Amy patient is currently active with [place]  Social Determinants of Health (SDOH) Interventions Food Insecurity Interventions: Intervention Not Indicated Financial Strain Interventions: Intervention Not Indicated Housing Interventions: Intervention Not Indicated Transportation Interventions: Other (Comment) (Pt's spouse can help with transportation)   Readmission Risk Interventions    03/06/2022    9:59 AM  Readmission Risk Prevention Plan  Transportation Screening Complete  PCP or Specialist Appt within 3-5 Days Complete  HRI or [place] Complete  Social Work Consult for [place]  Planning/Counseling Complete  Palliative Care Screening Not Applicable  Medication Review Press photographer) Referral to Pharmacy

## 2022-03-08 ENCOUNTER — Telehealth: Payer: Self-pay | Admitting: Family Medicine

## 2022-03-08 DIAGNOSIS — E785 Hyperlipidemia, unspecified: Secondary | ICD-10-CM | POA: Diagnosis not present

## 2022-03-08 DIAGNOSIS — M1990 Unspecified osteoarthritis, unspecified site: Secondary | ICD-10-CM | POA: Diagnosis not present

## 2022-03-08 DIAGNOSIS — I1310 Hypertensive heart and chronic kidney disease without heart failure, with stage 1 through stage 4 chronic kidney disease, or unspecified chronic kidney disease: Secondary | ICD-10-CM | POA: Diagnosis not present

## 2022-03-08 DIAGNOSIS — M48061 Spinal stenosis, lumbar region without neurogenic claudication: Secondary | ICD-10-CM | POA: Diagnosis not present

## 2022-03-08 DIAGNOSIS — M419 Scoliosis, unspecified: Secondary | ICD-10-CM | POA: Diagnosis not present

## 2022-03-08 DIAGNOSIS — N1831 Chronic kidney disease, stage 3a: Secondary | ICD-10-CM | POA: Diagnosis not present

## 2022-03-08 DIAGNOSIS — E1122 Type 2 diabetes mellitus with diabetic chronic kidney disease: Secondary | ICD-10-CM | POA: Diagnosis not present

## 2022-03-08 DIAGNOSIS — Z9981 Dependence on supplemental oxygen: Secondary | ICD-10-CM | POA: Diagnosis not present

## 2022-03-08 DIAGNOSIS — M5459 Other low back pain: Secondary | ICD-10-CM | POA: Diagnosis not present

## 2022-03-08 NOTE — Telephone Encounter (Signed)
Physical therapy verbals 1x1/2x3   request nursing evaluation for med management and diabetes management. Recently diagnosed with congestive heart failure and pulmonay embolism. Prescribed eloquist, 2, '5mg'$  tabs 2xd for 2 days. Then 1 tab 2xd for 2 days. Only took 1 tablet yesterday, wanted to make provider aware. Asking if it needs to be adjusted to make up for tablet not taken.

## 2022-03-09 ENCOUNTER — Ambulatory Visit (INDEPENDENT_AMBULATORY_CARE_PROVIDER_SITE_OTHER): Payer: Medicare Other | Admitting: Family Medicine

## 2022-03-09 ENCOUNTER — Encounter: Payer: Self-pay | Admitting: Family Medicine

## 2022-03-09 VITALS — BP 120/78 | HR 82 | Resp 16 | Ht 72.0 in

## 2022-03-09 DIAGNOSIS — I2699 Other pulmonary embolism without acute cor pulmonale: Secondary | ICD-10-CM

## 2022-03-09 DIAGNOSIS — E1140 Type 2 diabetes mellitus with diabetic neuropathy, unspecified: Secondary | ICD-10-CM | POA: Diagnosis not present

## 2022-03-09 DIAGNOSIS — I272 Pulmonary hypertension, unspecified: Secondary | ICD-10-CM | POA: Diagnosis not present

## 2022-03-09 DIAGNOSIS — N1831 Chronic kidney disease, stage 3a: Secondary | ICD-10-CM

## 2022-03-09 DIAGNOSIS — I10 Essential (primary) hypertension: Secondary | ICD-10-CM | POA: Diagnosis not present

## 2022-03-09 NOTE — Patient Instructions (Addendum)
A few things to remember from today's visit:  Acute pulmonary embolism without acute cor pulmonale, unspecified pulmonary embolism type (HCC)  Type 2 diabetes mellitus with diabetic neuropathy, without long-term current use of insulin ([place])  Essential hypertension - Plan: CBC, Basic metabolic panel  If you need refills please call your pharmacy. Do not use My Chart to request refills or for acute issues that need immediate attention.   No changes today. Continue working on wt loss. Increased physical activity as reccommended by cardiac rehab. [place] for nose dryness.  Please be sure medication list is accurate. If a new problem present, please set up appointment sooner than planned today.

## 2022-03-09 NOTE — Telephone Encounter (Signed)
Verbal given to [employer]. She is aware patient is seeing PCP today and medication will be discussed.

## 2022-03-09 NOTE — Assessment & Plan Note (Signed)
BP adequately controlled. Continue carvedilol 3.125 mg twice daily and clonidine 0.1 mg daily. Continue low-salt diet. Monitor BP at home.

## 2022-03-09 NOTE — Assessment & Plan Note (Signed)
HgA1C at goal. Continue nonpharmacologic treatment. Regular exercise and healthy diet with avoidance of added sugar food intake encouraged. Annual eye exam and foot care recommended. F/U in 3 months.

## 2022-03-09 NOTE — Assessment & Plan Note (Signed)
We discussed benefits of wt loss as well as adverse effects of obesity. He is very motivated to make positive lifestyle changes. Consistency with healthy diet encouraged. Continue advancing physical activity as recommended by cardiac rehab.

## 2022-03-09 NOTE — Progress Notes (Unsigned)
HPI: [name] is a 71 y.o. male, who is here today with his wife to follow on recent hospitalization.  Hospitalized on 02/28/2022 and discharged home on 03/06/2022. TCM call was not done.  Known history of saddle pulmonary embolism in 06/2020, at that time was hospitalized, he has significant hypotension. Presented to the ER date of hospitalization complaining of CP and dyspnea. He has had an episode of hypotension after taking duloxetine x2 because he missed a prior dose. Severe pulmonary hypertension. Echo 03/01/22: LVEF 28-36%, grade 1 diastolic dysfunction. Severe TR and right ventricular systolic function severely reduced. He underwent right/left heart cath and coronary angiography on 03/02/2022:   The left ventricular systolic function is normal.   LV end diastolic pressure is mildly elevated.   The left ventricular ejection fraction is 50-55% by visual estimate.   Hemodynamic findings consistent with moderate pulmonary hypertension.   There is severe (4+) tricuspid regurgitation.  Lower extremity US negative for DVT.  He is on Eliquis 5 mg twice daily. Intermittent rectal bleeding thought to be related with hemorrhoids, H/H has been stable. Constipation:*** He has not had any blood in the stool or melena since hospital discharge.  Lab Results  Component Value Date   WBC 6.2 03/05/2022   HGB 11.2 (L) 03/05/2022   HCT 34.3 (L) 03/05/2022   MCV 96.3 03/05/2022   PLT 232 03/05/2022   HTN: Atenolol-chlorthalidone where discontinued. Currently he is on carvedilol 3.125 mg twice daily. Torsemide 20 mg daily started. Negative for unusual headache, CP, palpitations, new focal neurologic deficit, or worsening edema. Dyspnea gradually improving. He is on O2 supplementation 3 LPM per [place]. He is having nasal mucosal dryness, no bleeding. Cardiac rehab at home. He is using a walker.  Lab Results  Component Value Date   CREATININE 1.60 (H) 03/05/2022   BUN 25 (H)  03/05/2022   NA 137 03/05/2022   K 3.6 03/05/2022   CL 99 03/05/2022   CO2 31 03/05/2022   DM2: Currently he is on nonpharmacologic treatment. BS's No checking.*** Lab Results  Component Value Date   HGBA1C 6.6 (H) 01/17/2022   Review of Systems  Constitutional:  Positive for fatigue. Negative for activity change, appetite change and fever.  HENT:  Negative for mouth sores and sore throat.   Eyes:  Negative for redness and visual disturbance.  Respiratory:  Negative for cough and wheezing.   Gastrointestinal:  Negative for abdominal pain, nausea and vomiting.  Genitourinary:  Negative for decreased urine volume, dysuria and hematuria.  Musculoskeletal:  Positive for arthralgias and gait problem.  Neurological:  Negative for syncope and facial asymmetry.  Rest see pertinent positives and negatives per HPI.  Current Outpatient Medications on File Prior to Visit  Medication Sig Dispense Refill   acetaminophen (TYLENOL) 325 MG tablet Take 1-2 tablets (325-650 mg total) by mouth every 4 (four) hours as needed for mild pain.     apixaban (ELIQUIS) 5 MG TABS tablet Take 2 tablets (10 mg total) by mouth 2 (two) times daily for 2 days then take 1 tablet (54m total) by mouth 2 (two) times daily thereafter. 728 tablet 0   apixaban (ELIQUIS) 5 MG TABS tablet Take 1 tablet (5 mg total) by mouth 2 (two) times daily. 60 tablet 11   Blood Glucose Monitoring Suppl (ACCU-CHEK GUIDE ME) w/Device KIT USE TO TEST BLOOD SUGARS 1-2 TIMES DAILY. 1 kit 0   carvedilol (COREG) 3.125 MG tablet Take 1 tablet (3.125 mg total) by mouth 2 (  two) times daily with a meal. 30 tablet 2   cloNIDine (CATAPRES) 0.1 MG tablet TAKE 1 TABLET (0.1 MG TOTAL) BY MOUTH DAILY. 90 tablet 2   DULoxetine (CYMBALTA) 60 MG capsule TAKE 1 CAPSULE BY MOUTH EVERY DAY (Patient taking differently: Take 60 mg by mouth daily.) 90 capsule 2   gabapentin (NEURONTIN) 300 MG capsule Take 1 capsule (300 mg total) by mouth 2 (two) times daily. 60  capsule 3   hydrocortisone (ANUSOL-HC) 2.5 % rectal cream Place rectally 3 (three) times daily. 30 g 0   levETIRAcetam (KEPPRA) 250 MG tablet TAKE 1 TABLET BY MOUTH TWICE A DAY (Patient taking differently: Take 250 mg by mouth 2 (two) times daily.) 180 tablet 1   Multiple Vitamin (MULITIVITAMIN WITH MINERALS) TABS Take 1 tablet by mouth daily with breakfast.     naloxone (NARCAN) nasal spray 4 mg/0.1 mL 1 spray as needed (accidental overdose).     oxyCODONE-acetaminophen (PERCOCET) 7.5-325 MG tablet Take 2 tablets by mouth every 6 (six) hours as needed for severe pain. Note that 120 pills were filled on 10/31--day prior to hospital admission. Taking this four times a day should be equal to using 10 mg three times a day as in the hospital. Can wean down as able. (Patient taking differently: Take 2 tablets by mouth every 6 (six) hours as needed for severe pain.) 30 tablet 0   polyethylene glycol (MIRALAX / GLYCOLAX) 17 g packet Take 17 g by mouth daily as needed for mild constipation.     Potassium 99 MG TABS Take 99 mg by mouth daily.     simvastatin (ZOCOR) 40 MG tablet TAKE 1 TABLET BY MOUTH EVERY DAY (Patient taking differently: Take 40 mg by mouth daily.) 90 tablet 2   torsemide (DEMADEX) 20 MG tablet Take 1 tablet (20 mg total) by mouth every Monday, Wednesday, and Friday. 20 tablet 3   No current facility-administered medications on file prior to visit.   Past Medical History:  Diagnosis Date   Allergy    Chronic back pain    Diabetes mellitus without complication ([place])    DNR (do not resuscitate) 11/19/2020   Duodenal ulcer hemorrhage 08/29/2014   ED (erectile dysfunction)    Esophageal stricture 08/30/2014   Glucose intolerance (impaired glucose tolerance)    Hiatal hernia 08/30/2014   Hypertension    Hypokalemia 04/11/2013   MRSA carrier 08/30/2014   Obesity    Osteoarthritis    Pneumonia    Pulmonary embolism ([place]) 06/2020   Scoliosis 2016   Spinal stenosis of lumbar region     Urinary tract infection 04/11/2013   Allergies  Allergen Reactions   Aspirin Other (See Comments)    Irritates the stomach and the patient developed ulcers, also   Lisinopril Other (See Comments)    Caused a body ache    Social History   Socioeconomic History   Marital status: Married    Spouse name: Not on file   Number of children: Not on file   Years of education: Not on file   Highest education level: Not on file  Occupational History   Occupation: retired  Tobacco Use   Smoking status: Former    Packs/day: 1.00    Years: 25.00    Pack years: 25.00    Types: Cigarettes    Quit date: 1995    Years since quitting: 28.4   Smokeless tobacco: Never  Vaping Use   Vaping Use: Never used  Substance and Sexual Activity  Alcohol use: Not Currently    Comment: holidays and special occasions   Drug use: Yes    Types: Oxycodone   Sexual activity: Yes    Birth control/protection: None  Other Topics Concern   Not on file  Social History Narrative   Married.     Social Determinants of Health   Financial Resource Strain: Low Risk    Difficulty of Paying Living Expenses: Not hard at all  Food Insecurity: No Food Insecurity   Worried About Charity fundraiser in the Last Year: Never true   [place] in the Last Year: Never true  Transportation Needs: No Transportation Needs   Lack of Transportation (Medical): No   Lack of Transportation (Non-Medical): No  Physical Activity: Sufficiently Active   Days of Exercise per Week: 7 days   Minutes of Exercise per Session: 30 min  Stress: No Stress Concern Present   Feeling of Stress : Not at all  Social Connections: Not on file   Vitals:   03/09/22 1452  BP: 120/78  Pulse: 82  Resp: 16  SpO2: 97%   Body mass index is 41.26 kg/m.  Physical Exam Nursing note reviewed.  Constitutional:      General: He is not in acute distress.    Appearance: He is well-developed.  HENT:     Head: Normocephalic and  atraumatic.  Eyes:     Conjunctiva/sclera: Conjunctivae normal.  Cardiovascular:     Rate and Rhythm: Normal rate and regular rhythm.     Pulses:          Dorsalis pedis pulses are 2+ on the right side and 2+ on the left side.     Heart sounds: No murmur heard.    Comments: DP pulses present. Pulmonary:     Effort: Pulmonary effort is normal. No respiratory distress.     Breath sounds: Normal breath sounds.  Abdominal:     Palpations: Abdomen is soft.     Tenderness: There is no abdominal tenderness.  Musculoskeletal:     Right lower leg: 1+ Pitting Edema present.     Left lower leg: 1+ Pitting Edema present.  Lymphadenopathy:     Cervical: No cervical adenopathy.  Skin:    General: Skin is warm.     Findings: No erythema or rash.  Neurological:     Mental Status: He is alert and oriented to person, place, and time.     Cranial Nerves: No cranial nerve deficit.     Gait: Gait normal.  Psychiatric:     Comments: Well groomed, good eye contact.   ASSESSMENT AND PLAN:  [name] was seen today for hospitalization follow-up.  Diagnoses and all orders for this visit:  Acute pulmonary embolism without acute cor pulmonale, unspecified pulmonary embolism type (HCC)  Type 2 diabetes mellitus with diabetic neuropathy, without long-term current use of insulin (HCC)  Essential hypertension -     Cancel: CBC; Future -     Cancel: Basic metabolic panel; Future -     Basic metabolic panel; Future -     CBC; Future -     CBC -     Basic metabolic panel    Orders Placed This Encounter  Procedures   Basic metabolic panel   CBC    No problem-specific Assessment & Plan notes found for this encounter.   Return in about 12 weeks (around 06/01/2022).   Carlyle Achenbach G. [name], MD  [facility]. [place] office.

## 2022-03-09 NOTE — Assessment & Plan Note (Signed)
Cr was 1.3-1.5 and e GFR 58-59 until 09/2021, in the past 4 weeks it has been  2.1-2.4. Continue adequate BP and glucose control. Avoid NSAIDs. Adequate hydration and low-salt diet.

## 2022-03-10 LAB — BASIC METABOLIC PANEL
BUN/Creatinine Ratio: 16 (calc) (ref 6–22)
BUN: 28 mg/dL — ABNORMAL HIGH (ref 7–25)
CO2: 28 mmol/L (ref 20–32)
Calcium: 9.4 mg/dL (ref 8.6–10.3)
Chloride: 105 mmol/L (ref 98–110)
Creat: 1.7 mg/dL — ABNORMAL HIGH (ref 0.70–1.35)
Glucose, Bld: 101 mg/dL — ABNORMAL HIGH (ref 65–99)
Potassium: 4.4 mmol/L (ref 3.5–5.3)
Sodium: 143 mmol/L (ref 135–146)

## 2022-03-10 LAB — CBC
HCT: 32 % — ABNORMAL LOW (ref 38.5–50.0)
Hemoglobin: 10.8 g/dL — ABNORMAL LOW (ref 13.2–17.1)
MCH: 31.6 pg (ref 27.0–33.0)
MCHC: 33.8 g/dL (ref 32.0–36.0)
MCV: 93.6 fL (ref 80.0–100.0)
MPV: 10.2 fL (ref 7.5–12.5)
Platelets: 262 10*3/uL (ref 140–400)
RBC: 3.42 10*6/uL — ABNORMAL LOW (ref 4.20–5.80)
RDW: 15.8 % — ABNORMAL HIGH (ref 11.0–15.0)
WBC: 4 10*3/uL (ref 3.8–10.8)

## 2022-03-13 DIAGNOSIS — E1122 Type 2 diabetes mellitus with diabetic chronic kidney disease: Secondary | ICD-10-CM | POA: Diagnosis not present

## 2022-03-13 DIAGNOSIS — I1310 Hypertensive heart and chronic kidney disease without heart failure, with stage 1 through stage 4 chronic kidney disease, or unspecified chronic kidney disease: Secondary | ICD-10-CM | POA: Diagnosis not present

## 2022-03-13 DIAGNOSIS — E785 Hyperlipidemia, unspecified: Secondary | ICD-10-CM | POA: Diagnosis not present

## 2022-03-13 DIAGNOSIS — M419 Scoliosis, unspecified: Secondary | ICD-10-CM | POA: Diagnosis not present

## 2022-03-13 DIAGNOSIS — Z9981 Dependence on supplemental oxygen: Secondary | ICD-10-CM | POA: Diagnosis not present

## 2022-03-13 DIAGNOSIS — M1990 Unspecified osteoarthritis, unspecified site: Secondary | ICD-10-CM | POA: Diagnosis not present

## 2022-03-13 DIAGNOSIS — M5459 Other low back pain: Secondary | ICD-10-CM | POA: Diagnosis not present

## 2022-03-13 DIAGNOSIS — N1831 Chronic kidney disease, stage 3a: Secondary | ICD-10-CM | POA: Diagnosis not present

## 2022-03-13 DIAGNOSIS — M48061 Spinal stenosis, lumbar region without neurogenic claudication: Secondary | ICD-10-CM | POA: Diagnosis not present

## 2022-03-13 NOTE — Progress Notes (Addendum)
HEART & VASCULAR TRANSITION OF CARE CONSULT NOTE     Referring Physician: Dr. Mahala Menghini Primary Care: Dr. [place] Primary Cardiologist: Dr. Algie Coffer  HPI: Referred to clinic by Dr. Mahala Menghini with [facility] for heart failure consultation. 71 y.o. male with history saddle PE/LLE DVT 09/21 s/p TPA c/b hypotension requiring Levophed, multiple prior back surgeries, left adrenal adenoma, DM II, obesity, HTN.  Admitted 03/01/22 with CP. He was hypoxic requiring supplemental oxygen. V/Q scan with moderate to high probability of PE.  Lifelong DOAC recommended, started on Eliquis 5 BID (had been off eliquis since 02/22).   Echo: EF 55-60%, mild LVH, interventricular septum flattened in systole and diastole consistent with RV pressure and volume overload, RV severely enlarged with severely reduced function, RVSP 75 mmH, moderate to severe TR.   R/LHC by Dr. Algie Coffer: RA 12, PA 48/34 (39), PCWP mean 40, V wave 42, LVEDP 10, Fick CO/CI 5.93/2.33, Thermo CO/CI 2.38/0.93.   He was seen by CT surgery. Felt to be high risk for pulmonary embolectomy. He would need to be transferred to academic center for evaluation if indicated. Coarse c/b AKI, Scr peaked at 3.34 and improved to 1.60 (baseline). Started on torsemide 20 mg M, W, F at discharge. Atenolol/HTCZ stopped, continued coreg and clonidine.  He is here today for f/u. Notes slight improvement in dyspnea with exertion. He is on 3L O2 since discharge, but has been able to wean down to 2L with no significant desaturations. Denies orthopnea or PND. Does report more lower extremity edema. He has lost 8 lbs since discharge with making changes to his diet. Watch sodium intake closely. He is taking all medications as prescribed. Notes a couple of nosebleeds, easily stopped with pressure. BP averaging 120s, but up to 140-150 systolic.   Review of Systems: [y] = yes, [ ]  = no   General: Weight gain [ ] ; Weight loss [ ] ; Anorexia [ ] ; Fatigue [ ] ; Fever [ ] ; Chills [ ] ;  Weakness [ ]   Cardiac: Chest pain/pressure [ ] ; Resting SOB [ ] ; Exertional SOB [Y]; Orthopnea [ ] ; Pedal Edema [Y]; Palpitations [ ] ; Syncope [ ] ; Presyncope [ ] ; Paroxysmal nocturnal dyspnea[ ]   Pulmonary: Cough [ ] ; Wheezing[ ] ; Hemoptysis[ ] ; Sputum [ ] ; Snoring [ ]   GI: Vomiting[ ] ; Dysphagia[ ] ; Melena[ ] ; Hematochezia [ ] ; Heartburn[ ] ; Abdominal pain [ ] ; Constipation [ ] ; Diarrhea [ ] ; BRBPR [ ]   GU: Hematuria[ ] ; Dysuria [ ] ; Nocturia[ ]   Vascular: Pain in legs with walking [ ] ; Pain in feet with lying flat [ ] ; Non-healing sores [ ] ; Stroke [ ] ; TIA [ ] ; Slurred speech [ ] ;  Neuro: Headaches[ ] ; Vertigo[ ] ; Seizures[ ] ; Paresthesias[ ] ;Blurred vision [ ] ; Diplopia [ ] ; Vision changes [ ]   Ortho/Skin: Arthritis [ ] ; Joint pain [ ] ; Muscle pain [ ] ; Joint swelling [ ] ; Back Pain [ ] ; Rash [ ]   Psych: Depression[ ] ; Anxiety[ ]   Heme: Bleeding problems [ ] ; Clotting disorders [ ] ; Anemia [ ]   Endocrine: Diabetes [Y]; Thyroid dysfunction[ ]    Past Medical History:  Diagnosis Date   Allergy    Chronic back pain    Diabetes mellitus without complication (HCC)    DNR (do not resuscitate) 11/19/2020   Duodenal ulcer hemorrhage 08/29/2014   ED (erectile dysfunction)    Esophageal stricture 08/30/2014   Glucose intolerance (impaired glucose tolerance)    Hiatal hernia 08/30/2014   Hypertension    Hypokalemia 04/11/2013   MRSA carrier  08/30/2014   Obesity    Osteoarthritis    Pneumonia    Pulmonary embolism (HCC) 06/2020   Scoliosis 2016   Spinal stenosis of lumbar region    Urinary tract infection 04/11/2013    Current Outpatient Medications  Medication Sig Dispense Refill   acetaminophen (TYLENOL) 325 MG tablet Take 1-2 tablets (325-650 mg total) by mouth every 4 (four) hours as needed for mild pain.     apixaban (ELIQUIS) 5 MG TABS tablet Take 1 tablet (5 mg total) by mouth 2 (two) times daily. 60 tablet 11   Blood Glucose Monitoring Suppl (ACCU-CHEK GUIDE ME) w/Device KIT  USE TO TEST BLOOD SUGARS 1-2 TIMES DAILY. 1 kit 0   carvedilol (COREG) 3.125 MG tablet Take 1 tablet (3.125 mg total) by mouth 2 (two) times daily with a meal. 30 tablet 2   DULoxetine (CYMBALTA) 60 MG capsule TAKE 1 CAPSULE BY MOUTH EVERY DAY 90 capsule 2   gabapentin (NEURONTIN) 300 MG capsule Take 1 capsule (300 mg total) by mouth 2 (two) times daily. 60 capsule 3   hydrocortisone (ANUSOL-HC) 2.5 % rectal cream Place 1 application. rectally as needed for hemorrhoids or anal itching.     levETIRAcetam (KEPPRA) 250 MG tablet TAKE 1 TABLET BY MOUTH TWICE A DAY 180 tablet 1   Multiple Vitamin (MULITIVITAMIN WITH MINERALS) TABS Take 1 tablet by mouth daily with breakfast.     naloxone (NARCAN) nasal spray 4 mg/0.1 mL 1 spray as needed (accidental overdose).     oxyCODONE-acetaminophen (PERCOCET) 7.5-325 MG tablet Take 2 tablets by mouth every 6 (six) hours as needed for severe pain. Note that 120 pills were filled on 10/31--day prior to hospital admission. Taking this four times a day should be equal to using 10 mg three times a day as in the hospital. Can wean down as able. 30 tablet 0   polyethylene glycol (MIRALAX / GLYCOLAX) 17 g packet Take 17 g by mouth daily as needed for mild constipation.     Potassium 99 MG TABS Take 99 mg by mouth daily.     simvastatin (ZOCOR) 40 MG tablet TAKE 1 TABLET BY MOUTH EVERY DAY 90 tablet 2   spironolactone (ALDACTONE) 25 MG tablet Take 0.5 tablets (12.5 mg total) by mouth daily. 15 tablet 3   torsemide (DEMADEX) 20 MG tablet Take 1 tablet (20 mg total) by mouth every Monday, Wednesday, and Friday. 20 tablet 3   No current facility-administered medications for this encounter.    Allergies  Allergen Reactions   Aspirin Other (See Comments)    Irritates the stomach and the patient developed ulcers, also   Lisinopril Other (See Comments)    Caused a body ache      Social History   Socioeconomic History   Marital status: Married    Spouse name: Not on  file   Number of children: Not on file   Years of education: Not on file   Highest education level: Not on file  Occupational History   Occupation: retired  Tobacco Use   Smoking status: Former    Packs/day: 1.00    Years: 25.00    Pack years: 25.00    Types: Cigarettes    Quit date: 1995    Years since quitting: 28.4   Smokeless tobacco: Never  Vaping Use   Vaping Use: Never used  Substance and Sexual Activity   Alcohol use: Not Currently    Comment: holidays and special occasions   Drug use: Yes  Types: Oxycodone   Sexual activity: Yes    Birth control/protection: None  Other Topics Concern   Not on file  Social History Narrative   Married.     Social Determinants of Health   Financial Resource Strain: Low Risk    Difficulty of Paying Living Expenses: Not hard at all  Food Insecurity: No Food Insecurity   Worried About Programme researcher, broadcasting/film/video in the Last Year: Never true   Ran Out of Food in the Last Year: Never true  Transportation Needs: No Transportation Needs   Lack of Transportation (Medical): No   Lack of Transportation (Non-Medical): No  Physical Activity: Sufficiently Active   Days of Exercise per Week: 7 days   Minutes of Exercise per Session: 30 min  Stress: No Stress Concern Present   Feeling of Stress : Not at all  Social Connections: Not on file  Intimate Partner Violence: Not on file      Family History  Problem Relation Age of Onset   Diabetes Mother    Asthma Mother    Hypertension Father    Stroke Father    Colon cancer Neg Hx     Vitals:   03/14/22 1654  BP: 130/80  Pulse: 76  SpO2: 98%  Weight: 136.1 kg    PHYSICAL EXAM: General:  No distress. Arrived in wheelchair. HEENT: normal Neck: supple. JVP 12 cm. Carotids 2+ bilat; no bruits.  Cor: PMI nondisplaced. Regular rate & rhythm. No rubs, gallop, + TR murmur Lungs: diminished, O2 stable on 2L [place] Abdomen: obese, soft, nontender, + distended.  Extremities: no cyanosis,  clubbing, rash, 2+ edema Neuro: alert & oriented x 3, cranial nerves grossly intact. moves all 4 extremities w/o difficulty. Affect pleasant.  ECG: SR with 1st degree AVB, 75 bpm   ASSESSMENT & PLAN: Acute on chronic diastolic CHF with RV failure -Echo 05/23: EF 55-60%, RV severely reduced, RVSP 75 mmHg, moderate to severe TR -R/LHC 05/23: RA 12, PA 48/34 (39), PCWP mean 40, V wave 42, LVEDP 10, Fick CO/CI 5.93/2.33, Thermo CO/CI 2.38/0.93.  -Suspect RV failure primarily d/t PE -Reviewed records from recent hospitalization with Dr. [name]. No acute need for referral for pulmonary embolectomy.  -Repeat echo and V/Q scan in 3 months, depending on results will consider repeat RHC. -NYHA III. Appears volume up.  -Increase Torsemide to 20 mg daily X 1 week then decrease back to M, W, F -Start spiro 12.5 mg daily -Continue coreg 3.125 mg BID -Stop clonidine to allow room for medication titration  Pulmonary hypertension -Suspect predominately group IV  -RHC in 05/23 as above  Tricuspid regurgitation -Moderate to severe on echo 05/23. In setting of RV failure/PE. -Reassess on echo in 3 months  Hx PE/DVT -Hx PE/DVT 09/21 s/p TPA, required pressor support with NE -V/Q scan 05/23 with moderate to high probability PE -Reviewed records from recent hospitalization with Dr. [name]. No acute need for referral for pulmonary embolectomy.  -Will need lifelong anticoagulation, on Eliquis 5 BID  CKD IIIa/recent AKI -Scr up to 3.3 during recent admit, improved to baseline (1.6) -Labs today  HTN -BP stable -Meds as above  Morbid obesity -Continue weight loss efforts.  -Congratulated patient on changes to diet.  NYHA III GDMT  Diuretic-Torsemide 20 M, W, F BB-Coreg 3.125 BID Ace/ARB/ARNI-Can consider at f/u MRA-Adding Spiro 12.5 mg daily SGLT2i-can consider at f/u    Referred to HFSW (PCP, Medications, Transportation, ETOH Abuse, Drug Abuse, Insurance, Financial ): No Refer to  Pharmacy: No  Refer to Home Health: No Refer to Advanced Heart Failure Clinic: Yes Refer to General Cardiology: No  Follow up: APP 4 weeks to assess volume, 3 months with Dr. [name] with echo.

## 2022-03-14 ENCOUNTER — Encounter (HOSPITAL_COMMUNITY): Payer: Self-pay

## 2022-03-14 ENCOUNTER — Telehealth: Payer: Self-pay | Admitting: Family Medicine

## 2022-03-14 ENCOUNTER — Other Ambulatory Visit (HOSPITAL_COMMUNITY): Payer: Self-pay

## 2022-03-14 ENCOUNTER — Ambulatory Visit (HOSPITAL_COMMUNITY)
Admit: 2022-03-14 | Discharge: 2022-03-14 | Disposition: A | Discharge status: Home / Self Care | Payer: Medicare Other | Attending: Physician Assistant | Admitting: Physician Assistant

## 2022-03-14 ENCOUNTER — Telehealth (HOSPITAL_COMMUNITY): Payer: Self-pay | Admitting: *Deleted

## 2022-03-14 VITALS — BP 130/80 | HR 76 | Wt 300.0 lb

## 2022-03-14 DIAGNOSIS — E669 Obesity, unspecified: Secondary | ICD-10-CM | POA: Diagnosis not present

## 2022-03-14 DIAGNOSIS — Z7901 Long term (current) use of anticoagulants: Secondary | ICD-10-CM | POA: Diagnosis not present

## 2022-03-14 DIAGNOSIS — N179 Acute kidney failure, unspecified: Secondary | ICD-10-CM | POA: Insufficient documentation

## 2022-03-14 DIAGNOSIS — I361 Nonrheumatic tricuspid (valve) insufficiency: Secondary | ICD-10-CM | POA: Diagnosis not present

## 2022-03-14 DIAGNOSIS — E1122 Type 2 diabetes mellitus with diabetic chronic kidney disease: Secondary | ICD-10-CM | POA: Diagnosis not present

## 2022-03-14 DIAGNOSIS — M5459 Other low back pain: Secondary | ICD-10-CM | POA: Diagnosis not present

## 2022-03-14 DIAGNOSIS — M419 Scoliosis, unspecified: Secondary | ICD-10-CM | POA: Diagnosis not present

## 2022-03-14 DIAGNOSIS — Z86718 Personal history of other venous thrombosis and embolism: Secondary | ICD-10-CM | POA: Insufficient documentation

## 2022-03-14 DIAGNOSIS — N1831 Chronic kidney disease, stage 3a: Secondary | ICD-10-CM | POA: Diagnosis not present

## 2022-03-14 DIAGNOSIS — Z86711 Personal history of pulmonary embolism: Secondary | ICD-10-CM | POA: Diagnosis not present

## 2022-03-14 DIAGNOSIS — I272 Pulmonary hypertension, unspecified: Secondary | ICD-10-CM | POA: Diagnosis not present

## 2022-03-14 DIAGNOSIS — D3502 Benign neoplasm of left adrenal gland: Secondary | ICD-10-CM | POA: Insufficient documentation

## 2022-03-14 DIAGNOSIS — I071 Rheumatic tricuspid insufficiency: Secondary | ICD-10-CM | POA: Insufficient documentation

## 2022-03-14 DIAGNOSIS — Z79899 Other long term (current) drug therapy: Secondary | ICD-10-CM | POA: Diagnosis not present

## 2022-03-14 DIAGNOSIS — M48061 Spinal stenosis, lumbar region without neurogenic claudication: Secondary | ICD-10-CM | POA: Diagnosis not present

## 2022-03-14 DIAGNOSIS — I130 Hypertensive heart and chronic kidney disease with heart failure and stage 1 through stage 4 chronic kidney disease, or unspecified chronic kidney disease: Secondary | ICD-10-CM | POA: Diagnosis not present

## 2022-03-14 DIAGNOSIS — Z7902 Long term (current) use of antithrombotics/antiplatelets: Secondary | ICD-10-CM | POA: Diagnosis not present

## 2022-03-14 DIAGNOSIS — I5033 Acute on chronic diastolic (congestive) heart failure: Secondary | ICD-10-CM | POA: Diagnosis not present

## 2022-03-14 DIAGNOSIS — I10 Essential (primary) hypertension: Secondary | ICD-10-CM | POA: Diagnosis not present

## 2022-03-14 DIAGNOSIS — I1310 Hypertensive heart and chronic kidney disease without heart failure, with stage 1 through stage 4 chronic kidney disease, or unspecified chronic kidney disease: Secondary | ICD-10-CM | POA: Diagnosis not present

## 2022-03-14 DIAGNOSIS — M1990 Unspecified osteoarthritis, unspecified site: Secondary | ICD-10-CM | POA: Diagnosis not present

## 2022-03-14 DIAGNOSIS — Z9981 Dependence on supplemental oxygen: Secondary | ICD-10-CM | POA: Diagnosis not present

## 2022-03-14 DIAGNOSIS — E785 Hyperlipidemia, unspecified: Secondary | ICD-10-CM | POA: Diagnosis not present

## 2022-03-14 LAB — BASIC METABOLIC PANEL
Anion gap: 6 (ref 5–15)
BUN: 17 mg/dL (ref 8–23)
CO2: 26 mmol/L (ref 22–32)
Calcium: 9.9 mg/dL (ref 8.9–10.3)
Chloride: 109 mmol/L (ref 98–111)
Creatinine, Ser: 1.36 mg/dL — ABNORMAL HIGH (ref 0.61–1.24)
GFR, Estimated: 56 mL/min — ABNORMAL LOW (ref >60)
Glucose, Bld: 99 mg/dL (ref 70–99)
Potassium: 4.6 mmol/L (ref 3.5–5.1)
Sodium: 141 mmol/L (ref 135–145)

## 2022-03-14 LAB — BRAIN NATRIURETIC PEPTIDE: B Natriuretic Peptide: 184.6 pg/mL — ABNORMAL HIGH (ref 0.0–100.0)

## 2022-03-14 LAB — CBC
HCT: 35.7 % — ABNORMAL LOW (ref 39.0–52.0)
Hemoglobin: 11.4 g/dL — ABNORMAL LOW (ref 13.0–17.0)
MCH: 31.1 pg (ref 26.0–34.0)
MCHC: 31.9 g/dL (ref 30.0–36.0)
MCV: 97.5 fL (ref 80.0–100.0)
Platelets: 261 10*3/uL (ref 150–400)
RBC: 3.66 MIL/uL — ABNORMAL LOW (ref 4.22–5.81)
RDW: 15.1 % (ref 11.5–15.5)
WBC: 3.9 10*3/uL — ABNORMAL LOW (ref 4.0–10.5)
nRBC: 0 % (ref 0.0–0.2)

## 2022-03-14 MED ORDER — SPIRONOLACTONE 25 MG PO TABS: 12.5000 mg | ORAL_TABLET | Freq: Every day | ORAL | 3 refills | Status: DC

## 2022-03-14 NOTE — Patient Instructions (Signed)
Medication Changes:  STOP Clonidine  Take Torsemide Daily for 1 week then back to 3 times a week  START Spironolactone 12.5 mg (1/2 tab) Daily  Lab Work:  Labs done today, your results will be available in MyChart, we will contact you for abnormal readings.  Your physician recommends that you return for lab work in: 1 week  Testing/Procedures:  Your physician has requested that you have an echocardiogram. Echocardiography is a painless test that uses sound waves to create images of your heart. It provides your doctor with information about the size and shape of your heart and how well your heart's chambers and valves are working. This procedure takes approximately one hour. There are no restrictions for this procedure. IN 3 MONTHS  Referrals:  None  Special Instructions // Education:  Do the following things EVERYDAY: Weigh yourself in the morning before breakfast. Write it down and keep it in a log. Take your medicines as prescribed Eat low salt foods--Limit salt (sodium) to 2000 mg per day.  Stay as active as you can everyday Limit all fluids for the day to less than 2 liters   Follow-Up in: 1 month and again in 3 months with an echocardiogram  At the [facility], you and your health needs are our priority. We have a designated team specialized in the treatment of Heart Failure. This Care Team includes your primary Heart Failure Specialized Cardiologist (physician), Advanced Practice Providers (APPs- Physician Assistants and Nurse Practitioners), and Pharmacist who all work together to provide you with the care you need, when you need it.   You may see any of the following providers on your designated Care Team at your next follow up:  Dr Glori Bickers Dr Haynes Kerns, NP Lyda Jester, [facility] [place], [place] Audry Riles, PharmD   Please be sure to bring in all your medications bottles to every appointment.   Need  to Contact us:  If you have any questions or concerns before your next appointment please send [place] a message through [place] or call our office at [phone].    TO LEAVE A MESSAGE FOR THE NURSE SELECT OPTION 2, PLEASE LEAVE A MESSAGE INCLUDING: YOUR NAME DATE OF BIRTH CALL BACK NUMBER REASON FOR CALL**this is important as we prioritize the call backs  YOU WILL RECEIVE A CALL BACK THE SAME DAY AS LONG AS YOU CALL BEFORE 4:00 PM

## 2022-03-14 NOTE — Telephone Encounter (Signed)
Will from [facility] call and stated he need a verbal order for outpatient therapy 2 X for 3 wk's and 1 x a wk for 1 wk. [place] # is [phone].

## 2022-03-14 NOTE — Telephone Encounter (Signed)
VO given to Will. 

## 2022-03-14 NOTE — Telephone Encounter (Signed)
Called to confirm Heart & Vascular Transitions of Care appointment at 3 pm on 03/14/22. Patient reminded to bring all medications and pill box organizer with them. Confirmed patient has transportation. Gave directions, instructed to utilize [address] parking.  Confirmed appointment prior to ending call.    Earnestine Leys, BSN, Clinical cytogeneticist Only

## 2022-03-15 ENCOUNTER — Telehealth: Payer: Self-pay | Admitting: Pharmacist

## 2022-03-15 DIAGNOSIS — Z9981 Dependence on supplemental oxygen: Secondary | ICD-10-CM | POA: Diagnosis not present

## 2022-03-15 DIAGNOSIS — E785 Hyperlipidemia, unspecified: Secondary | ICD-10-CM | POA: Diagnosis not present

## 2022-03-15 DIAGNOSIS — E1122 Type 2 diabetes mellitus with diabetic chronic kidney disease: Secondary | ICD-10-CM | POA: Diagnosis not present

## 2022-03-15 DIAGNOSIS — M1990 Unspecified osteoarthritis, unspecified site: Secondary | ICD-10-CM | POA: Diagnosis not present

## 2022-03-15 DIAGNOSIS — M5459 Other low back pain: Secondary | ICD-10-CM | POA: Diagnosis not present

## 2022-03-15 DIAGNOSIS — I1310 Hypertensive heart and chronic kidney disease without heart failure, with stage 1 through stage 4 chronic kidney disease, or unspecified chronic kidney disease: Secondary | ICD-10-CM | POA: Diagnosis not present

## 2022-03-15 DIAGNOSIS — N1831 Chronic kidney disease, stage 3a: Secondary | ICD-10-CM | POA: Diagnosis not present

## 2022-03-15 DIAGNOSIS — M48061 Spinal stenosis, lumbar region without neurogenic claudication: Secondary | ICD-10-CM | POA: Diagnosis not present

## 2022-03-15 DIAGNOSIS — M419 Scoliosis, unspecified: Secondary | ICD-10-CM | POA: Diagnosis not present

## 2022-03-15 NOTE — Progress Notes (Unsigned)
Chronic Care Management Pharmacy Assistant   Name: Keith [name]  MRN: [id number] DOB: 02/09/1951  Reason for Encounter: Disease State   Conditions to be addressed/monitored: HTN  Recent office visits:  03/09/22 [name], [name], MD - Patient presented for Acute pulmonary embolism without acute cor pulmonale unspecified pulmonary embolism and other concerns. No medication changes. Continue Eliquis. Continue Carvedilol  Recent consult visits:  03/14/22 Keith Catching, PA-C - Patient presented for Acute on CHF and other concerns. Prescribed Spironolactone 12.5 mg daily.  03/02/22 Keith Dials, MD - Patient presented for Right/Left Cath & Coronary Angiography. No medication changes.  Hospital visits:  Medication Reconciliation was completed by comparing discharge summary, patient's EMR and Pharmacy list, and upon discussion with patient.  Patient presented to [facility] on 02/28/22 due to Chest Pain. Patient was present for 6 days.  New?Medications Started at [facility] Discharge:?? -started  carvedilol (COREG) Eliquis (apixaban) apixaban Arne [place]) Start taking on: March 09, 2022 gabapentin (NEURONTIN) This replaces a similar medication. See the full medication list for instructions. Proctozone-HC (hydrocortisone) torsemide (DEMADEX) Start taking on: Mar 07, 2022  Medication Changes at Hospital Discharge: -Changed  none  Medications Discontinued at Hospital Discharge: -Stopped atenolol-chlorthalidone 50-25 MG tablet (TENORETIC) gabapentin 600 MG tablet (NEURONTIN) Replaced by a similar medication.  Medications that remain the same after Hospital Discharge:??  -All other medications will remain the same.    Medications: Outpatient Encounter Medications as of 03/15/2022  Medication Sig   acetaminophen (TYLENOL) 325 MG tablet Take 1-2 tablets (325-650 mg total) by mouth every 4 (four) hours as needed for mild pain.   apixaban (ELIQUIS) 5 MG TABS  tablet Take 1 tablet (5 mg total) by mouth 2 (two) times daily.   Blood Glucose Monitoring Suppl (ACCU-CHEK GUIDE ME) w/Device KIT USE TO TEST BLOOD SUGARS 1-2 TIMES DAILY.   carvedilol (COREG) 3.125 MG tablet Take 1 tablet (3.125 mg total) by mouth 2 (two) times daily with a meal.   DULoxetine (CYMBALTA) 60 MG capsule TAKE 1 CAPSULE BY MOUTH EVERY DAY   gabapentin (NEURONTIN) 300 MG capsule Take 1 capsule (300 mg total) by mouth 2 (two) times daily.   hydrocortisone (ANUSOL-HC) 2.5 % rectal cream Place 1 application. rectally as needed for hemorrhoids or anal itching.   levETIRAcetam (KEPPRA) 250 MG tablet TAKE 1 TABLET BY MOUTH TWICE A DAY   Multiple Vitamin (MULITIVITAMIN WITH MINERALS) TABS Take 1 tablet by mouth daily with breakfast.   naloxone (NARCAN) nasal spray 4 mg/0.1 mL 1 spray as needed (accidental overdose).   oxyCODONE-acetaminophen (PERCOCET) 7.5-325 MG tablet Take 2 tablets by mouth every 6 (six) hours as needed for severe pain. Note that 120 pills were filled on 10/31--day prior to hospital admission. Taking this four times a day should be equal to using 10 mg three times a day as in the hospital. Can wean down as able.   polyethylene glycol (MIRALAX / GLYCOLAX) 17 [name] packet Take 17 [name] by mouth daily as needed for mild constipation.   Potassium 99 MG TABS Take 99 mg by mouth daily.   simvastatin (ZOCOR) 40 MG tablet TAKE 1 TABLET BY MOUTH EVERY DAY   spironolactone (ALDACTONE) 25 MG tablet Take 0.5 tablets (12.5 mg total) by mouth daily.   torsemide (DEMADEX) 20 MG tablet Take 1 tablet (20 mg total) by mouth every Monday, Wednesday, and Friday.   No facility-administered encounter medications on file as of 03/15/2022.  Reviewed chart prior to disease state call. Spoke  with patient regarding BP  Recent Office Vitals: BP Readings from Last 3 Encounters:  03/14/22 130/80  03/09/22 120/78  03/06/22 138/86   Pulse Readings from Last 3 Encounters:  03/14/22 76  03/09/22 82   03/06/22 80    Wt Readings from Last 3 Encounters:  03/14/22 300 lb (136.1 kg)  03/01/22 (!) 304 lb 3.8 oz (138 kg)  01/09/22 291 lb 3.2 oz (132.1 kg)     Kidney Function Lab Results  Component Value Date/Time   CREATININE 1.36 (H) 03/14/2022 04:03 PM   CREATININE 1.70 (H) 03/09/2022 03:42 PM   CREATININE 1.60 (H) 03/05/2022 12:34 AM   GFR 40.06 (L) 01/17/2022 11:40 AM   GFRNONAA 56 (L) 03/14/2022 04:03 PM   GFRAA >60 07/11/2020 06:19 AM       Latest Ref Rng & Units 03/14/2022    4:03 PM 03/09/2022    3:42 PM 03/05/2022   12:34 AM  BMP  Glucose 70 - 99 mg/dL 99  101  143   BUN 8 - 23 mg/dL _0 Creatinine 0.61 - 1.24 mg/dL 1.36  1.70  1.60   BUN/Creat Ratio 6 - 22 (calc)  16    Sodium 135 - 145 mmol/L 141  143  137   Potassium 3.5 - 5.1 mmol/L 4.6  4.4  3.6   Chloride 98 - 111 mmol/L 109  105  99   CO2 22 - 32 mmol/L _1 Calcium 8.9 - 10.3 mg/dL 9.9  9.4  10.0     Current antihypertensive regimen:  carvedilol (COREG) 3.125 MG tablet twice daily with meals Clonidine 0.1 mg 1 tablet daily - Appropriate, Effective, Safe, Accessible How often are you checking your Blood Pressure? {CHL HP BP Monitoring Frequency:[phone]} Current home BP readings: *** What recent interventions/DTPs have been made by any provider to improve Blood Pressure control since last CPP Visit: Patient was started on Carvedilol since hospital stay. Any recent hospitalizations or ED visits since last visit with CPP? Yes What diet changes have been made to improve Blood Pressure Control?  *** What exercise is being done to improve your Blood Pressure Control?  ***  Adherence Review: Is the patient currently on ACE/ARB medication? No Does the patient have >5 day gap between last estimated fill dates? {yes/no:20286}  Maddie offer to move up July   Care Gaps: [place] Zoster Vaccine - Postponed CCM- 9/23 BP- 120/70 ( 03/09/22) AWV-  4/23 Lab  Results  Component Value Date   HGBA1C 6.6 (H) 01/17/2022    Star Rating Drugs: Simvastatin 40 mg - Last filled 03/15/22 90 DS at [place] Pharmacist Assistant [phone]

## 2022-03-16 DIAGNOSIS — M4714 Other spondylosis with myelopathy, thoracic region: Secondary | ICD-10-CM | POA: Diagnosis not present

## 2022-03-20 DIAGNOSIS — M5459 Other low back pain: Secondary | ICD-10-CM | POA: Diagnosis not present

## 2022-03-20 DIAGNOSIS — I1310 Hypertensive heart and chronic kidney disease without heart failure, with stage 1 through stage 4 chronic kidney disease, or unspecified chronic kidney disease: Secondary | ICD-10-CM | POA: Diagnosis not present

## 2022-03-20 DIAGNOSIS — M419 Scoliosis, unspecified: Secondary | ICD-10-CM | POA: Diagnosis not present

## 2022-03-20 DIAGNOSIS — E1122 Type 2 diabetes mellitus with diabetic chronic kidney disease: Secondary | ICD-10-CM | POA: Diagnosis not present

## 2022-03-20 DIAGNOSIS — E785 Hyperlipidemia, unspecified: Secondary | ICD-10-CM | POA: Diagnosis not present

## 2022-03-20 DIAGNOSIS — M1990 Unspecified osteoarthritis, unspecified site: Secondary | ICD-10-CM | POA: Diagnosis not present

## 2022-03-20 DIAGNOSIS — M48061 Spinal stenosis, lumbar region without neurogenic claudication: Secondary | ICD-10-CM | POA: Diagnosis not present

## 2022-03-20 DIAGNOSIS — Z9981 Dependence on supplemental oxygen: Secondary | ICD-10-CM | POA: Diagnosis not present

## 2022-03-20 DIAGNOSIS — N1831 Chronic kidney disease, stage 3a: Secondary | ICD-10-CM | POA: Diagnosis not present

## 2022-03-21 DIAGNOSIS — Z9981 Dependence on supplemental oxygen: Secondary | ICD-10-CM | POA: Diagnosis not present

## 2022-03-21 DIAGNOSIS — M419 Scoliosis, unspecified: Secondary | ICD-10-CM | POA: Diagnosis not present

## 2022-03-21 DIAGNOSIS — N1831 Chronic kidney disease, stage 3a: Secondary | ICD-10-CM | POA: Diagnosis not present

## 2022-03-21 DIAGNOSIS — M5459 Other low back pain: Secondary | ICD-10-CM | POA: Diagnosis not present

## 2022-03-21 DIAGNOSIS — E1122 Type 2 diabetes mellitus with diabetic chronic kidney disease: Secondary | ICD-10-CM | POA: Diagnosis not present

## 2022-03-21 DIAGNOSIS — I1310 Hypertensive heart and chronic kidney disease without heart failure, with stage 1 through stage 4 chronic kidney disease, or unspecified chronic kidney disease: Secondary | ICD-10-CM | POA: Diagnosis not present

## 2022-03-21 DIAGNOSIS — E785 Hyperlipidemia, unspecified: Secondary | ICD-10-CM | POA: Diagnosis not present

## 2022-03-21 DIAGNOSIS — M1990 Unspecified osteoarthritis, unspecified site: Secondary | ICD-10-CM | POA: Diagnosis not present

## 2022-03-21 DIAGNOSIS — M48061 Spinal stenosis, lumbar region without neurogenic claudication: Secondary | ICD-10-CM | POA: Diagnosis not present

## 2022-03-22 DIAGNOSIS — E785 Hyperlipidemia, unspecified: Secondary | ICD-10-CM | POA: Diagnosis not present

## 2022-03-22 DIAGNOSIS — M419 Scoliosis, unspecified: Secondary | ICD-10-CM | POA: Diagnosis not present

## 2022-03-22 DIAGNOSIS — M48061 Spinal stenosis, lumbar region without neurogenic claudication: Secondary | ICD-10-CM | POA: Diagnosis not present

## 2022-03-22 DIAGNOSIS — M5459 Other low back pain: Secondary | ICD-10-CM | POA: Diagnosis not present

## 2022-03-22 DIAGNOSIS — E1122 Type 2 diabetes mellitus with diabetic chronic kidney disease: Secondary | ICD-10-CM | POA: Diagnosis not present

## 2022-03-22 DIAGNOSIS — Z9981 Dependence on supplemental oxygen: Secondary | ICD-10-CM | POA: Diagnosis not present

## 2022-03-22 DIAGNOSIS — I1310 Hypertensive heart and chronic kidney disease without heart failure, with stage 1 through stage 4 chronic kidney disease, or unspecified chronic kidney disease: Secondary | ICD-10-CM | POA: Diagnosis not present

## 2022-03-22 DIAGNOSIS — M1990 Unspecified osteoarthritis, unspecified site: Secondary | ICD-10-CM | POA: Diagnosis not present

## 2022-03-22 DIAGNOSIS — N1831 Chronic kidney disease, stage 3a: Secondary | ICD-10-CM | POA: Diagnosis not present

## 2022-03-23 ENCOUNTER — Telehealth (HOSPITAL_COMMUNITY): Payer: Self-pay

## 2022-03-23 ENCOUNTER — Ambulatory Visit (HOSPITAL_COMMUNITY)
Admission: RE | Admit: 2022-03-23 | Discharge: 2022-03-23 | Disposition: A | Discharge status: Home / Self Care | Payer: Medicare Other | Source: Ambulatory Visit | Attending: Internal Medicine | Admitting: Internal Medicine

## 2022-03-23 ENCOUNTER — Other Ambulatory Visit (HOSPITAL_COMMUNITY): Payer: Self-pay

## 2022-03-23 ENCOUNTER — Telehealth: Payer: Self-pay | Admitting: Family Medicine

## 2022-03-23 DIAGNOSIS — I5033 Acute on chronic diastolic (congestive) heart failure: Secondary | ICD-10-CM | POA: Diagnosis not present

## 2022-03-23 LAB — BASIC METABOLIC PANEL
Anion gap: 9 (ref 5–15)
BUN: 22 mg/dL (ref 8–23)
CO2: 23 mmol/L (ref 22–32)
Calcium: 9.8 mg/dL (ref 8.9–10.3)
Chloride: 107 mmol/L (ref 98–111)
Creatinine, Ser: 1.39 mg/dL — ABNORMAL HIGH (ref 0.61–1.24)
GFR, Estimated: 55 mL/min — ABNORMAL LOW (ref >60)
Glucose, Bld: 124 mg/dL — ABNORMAL HIGH (ref 70–99)
Potassium: 4.3 mmol/L (ref 3.5–5.1)
Sodium: 139 mmol/L (ref 135–145)

## 2022-03-23 NOTE — Telephone Encounter (Signed)
Charile from [facility] call and stated recent prescribe spironolactone have a interaction with postassium

## 2022-03-23 NOTE — Telephone Encounter (Signed)
Pharmacy Transitions of Care Follow-up Telephone Call  Date of discharge: 03/06/22  Discharge Diagnosis: Acute PE  How have you been since you were released from the hospital? Patient has been struggling with headaches since starting spironolactone. States blood pressure runs high in the monring and afternoon and returns to normal around 5PM. Instructed patient to speak wit MD about his symptoms and possible med adjustments.  Medication changes made at discharge:      START taking: apixaban (ELIQUIS)  carvedilol (COREG)  gabapentin (NEURONTIN)  This replaces a similar medication. See the full medication list for instructions. torsemide (DEMADEX)  STOP taking: atenolol-chlorthalidone 50-25 MG tablet (TENORETIC)  gabapentin 600 MG tablet (NEURONTIN)  Replaced by a similar medication.  Medication changes verified by the patient? Yes    Medication Accessibility:  Home Pharmacy: [facility] [address]   Was the patient provided with refills on discharged medications? Yes   Have all prescriptions been transferred from [facility] to home pharmacy? Yes   Is the patient able to afford medications? Has insurance    Medication Review:   APIXABAN (ELIQUIS)  Apixaban 10 mg BID initiated on 03/06/22. Has switched to  - Discussed importance of taking medication around the same time everyday  - Advised patient of medications to avoid (NSAIDs, ASA)  - Educated that Tylenol (acetaminophen) will be the preferred analgesic to prevent risk of bleeding  - Emphasized importance of monitoring for signs and symptoms of bleeding (abnormal bruising, prolonged bleeding, nose bleeds, bleeding from gums, discolored urine, black tarry stools)  - Advised patient to alert all providers of anticoagulation therapy prior to starting a new medication or having a procedure    Follow-up Appointments:  PCP Hospital f/u appt confirmed?  Seen by Dr. [name] on 03/09/22  Specialist Hospital f/u appt confirmed?  Scheduled to see  Cardiology on 03/23/22  If their condition worsens, is the pt aware to call PCP or go to the Emergency Dept.? Yes  Final Patient Assessment: Patient has f/u scheduled and refills at home pharmacy

## 2022-03-26 ENCOUNTER — Telehealth: Payer: Self-pay

## 2022-03-26 DIAGNOSIS — I1310 Hypertensive heart and chronic kidney disease without heart failure, with stage 1 through stage 4 chronic kidney disease, or unspecified chronic kidney disease: Secondary | ICD-10-CM | POA: Diagnosis not present

## 2022-03-26 DIAGNOSIS — E785 Hyperlipidemia, unspecified: Secondary | ICD-10-CM | POA: Diagnosis not present

## 2022-03-26 DIAGNOSIS — M419 Scoliosis, unspecified: Secondary | ICD-10-CM | POA: Diagnosis not present

## 2022-03-26 DIAGNOSIS — M48061 Spinal stenosis, lumbar region without neurogenic claudication: Secondary | ICD-10-CM | POA: Diagnosis not present

## 2022-03-26 DIAGNOSIS — N1831 Chronic kidney disease, stage 3a: Secondary | ICD-10-CM | POA: Diagnosis not present

## 2022-03-26 DIAGNOSIS — Z9981 Dependence on supplemental oxygen: Secondary | ICD-10-CM | POA: Diagnosis not present

## 2022-03-26 DIAGNOSIS — M5459 Other low back pain: Secondary | ICD-10-CM | POA: Diagnosis not present

## 2022-03-26 DIAGNOSIS — M1990 Unspecified osteoarthritis, unspecified site: Secondary | ICD-10-CM | POA: Diagnosis not present

## 2022-03-26 DIAGNOSIS — E1122 Type 2 diabetes mellitus with diabetic chronic kidney disease: Secondary | ICD-10-CM | POA: Diagnosis not present

## 2022-03-26 NOTE — Telephone Encounter (Signed)
Alyse Low called in to report that patient has a rating of 7/10 on pain scale due to back pain. He also is having some swelling and pain in his groin. Appointment has been scheduled for tomorrow at 4pm in office. PT will do low impact exercises as to not exacerbate injuries.

## 2022-03-26 NOTE — Progress Notes (Unsigned)
ACUTE VISIT Chief Complaint  Patient presents with   Groin Pain    Was doing excersies on the edge of the bed, felt a twinge. Had pain the next day & swelling. No pain now.   Groin Swelling   Medication Management    Issues with spirolactone, causing headaches   HPI: [name] is a 71 y.o. male with hx of chronic pain,HTN,DM II,peripheral neuropathy, CKD III, and PE on chronic anticoagulation here today complaining of right testicular/groin pain and bilateral scrotal edema as described above. He felt like he pull a muscle while exercising, some pain. Next morning he noted bilateral scrotal edema. Intermittent stubbing pain, exacerbated by reaching/bending. Alleviated by sitting up straight.  No prior hx, states that he has had some "irritation" after cardiac cath in 02/2022.  Testicle Pain The patient's primary symptoms include pelvic pain and testicular pain. The patient's pertinent negatives include no genital injury, genital itching, genital lesions, penile discharge or penile pain. This is a new problem. The current episode started in the past 7 days. The problem has been gradually worsening. The pain is moderate. Associated symptoms include abdominal pain. Pertinent negatives include no anorexia, chest pain, chills, coughing, discolored urine, dysuria, fever, flank pain, frequency, hematuria, hesitancy, nausea, rash, shortness of breath, sore throat, urgency, urinary retention or vomiting. The testicular pain affects the right testicle. There is swelling in both testicles. He has tried nothing for the symptoms.  He had a couple days of diarrhea before above the problem started, attributed to taking laxatives, it has resolved.  HTN: Spironolactone is causing headaches. Has not had headaches for the past 2 days. Negative for visual changes,PND,orthopnea,new focal deficit,or worsening LE edema. Clonidine and atenolol were discontinued. He is also on carvedilol 3.125 mg twice  daily. BP at home elevated in the morning, 140s to 150s/60s, later during the day 120s/60s.  Lab Results  Component Value Date   CREATININE 1.39 (H) 03/23/2022   BUN 22 03/23/2022   NA 139 03/23/2022   K 4.3 03/23/2022   CL 107 03/23/2022   CO2 23 03/23/2022   Review of Systems  Constitutional:  Positive for fatigue. Negative for appetite change, chills and fever.  HENT:  Negative for sore throat.   Respiratory:  Negative for cough, shortness of breath and wheezing.   Cardiovascular:  Negative for chest pain.  Gastrointestinal:  Positive for abdominal pain. Negative for anorexia, nausea and vomiting.  Genitourinary:  Positive for pelvic pain and testicular pain. Negative for dysuria, flank pain, frequency, hesitancy, penile discharge, penile pain and urgency.  Musculoskeletal:  Positive for arthralgias, back pain and gait problem.  Skin:  Negative for rash.  Neurological:  Negative for syncope and facial asymmetry.  Rest see pertinent positives and negatives per HPI.  Current Outpatient Medications on File Prior to Visit  Medication Sig Dispense Refill   acetaminophen (TYLENOL) 325 MG tablet Take 1-2 tablets (325-650 mg total) by mouth every 4 (four) hours as needed for mild pain.     apixaban (ELIQUIS) 5 MG TABS tablet Take 1 tablet (5 mg total) by mouth 2 (two) times daily. 60 tablet 11   Blood Glucose Monitoring Suppl (ACCU-CHEK GUIDE ME) w/Device KIT USE TO TEST BLOOD SUGARS 1-2 TIMES DAILY. 1 kit 0   carvedilol (COREG) 3.125 MG tablet Take 1 tablet (3.125 mg total) by mouth 2 (two) times daily with a meal. 30 tablet 2   DULoxetine (CYMBALTA) 60 MG capsule TAKE 1 CAPSULE BY MOUTH EVERY DAY 90  capsule 2   gabapentin (NEURONTIN) 300 MG capsule Take 1 capsule (300 mg total) by mouth 2 (two) times daily. 60 capsule 3   hydrocortisone (ANUSOL-HC) 2.5 % rectal cream Place 1 application. rectally as needed for hemorrhoids or anal itching.     levETIRAcetam (KEPPRA) 250 MG tablet TAKE 1  TABLET BY MOUTH TWICE A DAY 180 tablet 1   Multiple Vitamin (MULITIVITAMIN WITH MINERALS) TABS Take 1 tablet by mouth daily with breakfast.     naloxone (NARCAN) nasal spray 4 mg/0.1 mL 1 spray as needed (accidental overdose).     oxyCODONE-acetaminophen (PERCOCET) 7.5-325 MG tablet Take 2 tablets by mouth every 6 (six) hours as needed for severe pain. Note that 120 pills were filled on 10/31--day prior to hospital admission. Taking this four times a day should be equal to using 10 mg three times a day as in the hospital. Can wean down as able. 30 tablet 0   polyethylene glycol (MIRALAX / GLYCOLAX) 17 g packet Take 17 g by mouth daily as needed for mild constipation.     Potassium 99 MG TABS Take 99 mg by mouth daily.     simvastatin (ZOCOR) 40 MG tablet TAKE 1 TABLET BY MOUTH EVERY DAY 90 tablet 2   spironolactone (ALDACTONE) 25 MG tablet Take 0.5 tablets (12.5 mg total) by mouth daily. 15 tablet 3   torsemide (DEMADEX) 20 MG tablet Take 1 tablet (20 mg total) by mouth every Monday, Wednesday, and Friday. 20 tablet 3   No current facility-administered medications on file prior to visit.   Past Medical History:  Diagnosis Date   Allergy    Chronic back pain    Diabetes mellitus without complication ([place])    DNR (do not resuscitate) 11/19/2020   Duodenal ulcer hemorrhage 08/29/2014   ED (erectile dysfunction)    Esophageal stricture 08/30/2014   Glucose intolerance (impaired glucose tolerance)    Hiatal hernia 08/30/2014   Hypertension    Hypokalemia 04/11/2013   MRSA carrier 08/30/2014   Obesity    Osteoarthritis    Pneumonia    Pulmonary embolism ([place]) 06/2020   Scoliosis 2016   Spinal stenosis of lumbar region    Urinary tract infection 04/11/2013   Allergies  Allergen Reactions   Aspirin Other (See Comments)    Irritates the stomach and the patient developed ulcers, also   Lisinopril Other (See Comments)    Caused a body ache    Social History   Socioeconomic History    Marital status: Married    Spouse name: Not on file   Number of children: Not on file   Years of education: Not on file   Highest education level: Not on file  Occupational History   Occupation: retired  Tobacco Use   Smoking status: Former    Packs/day: 1.00    Years: 25.00    Total pack years: 25.00    Types: Cigarettes    Quit date: 1995    Years since quitting: 28.4   Smokeless tobacco: Never  Vaping Use   Vaping Use: Never used  Substance and Sexual Activity   Alcohol use: Not Currently    Comment: holidays and special occasions   Drug use: Yes    Types: Oxycodone   Sexual activity: Yes    Birth control/protection: None  Other Topics Concern   Not on file  Social History Narrative   Married.     Social Determinants of Health   Financial Resource Strain: Low Risk  (  03/06/2022)   Overall Financial Resource Strain (CARDIA)    Difficulty of Paying Living Expenses: Not hard at all  Food Insecurity: No Food Insecurity (03/06/2022)   Hunger Vital Sign    Worried About Running Out of Food in the Last Year: Never true    Ran Out of Food in the Last Year: Never true  Transportation Needs: No Transportation Needs (03/06/2022)   PRAPARE - Hydrologist (Medical): No    Lack of Transportation (Non-Medical): No  Recent Concern: Transportation Needs - Unmet Transportation Needs (12/27/2021)   PRAPARE - Hydrologist (Medical): Yes    Lack of Transportation (Non-Medical): No  Physical Activity: Sufficiently Active (01/09/2022)   Exercise Vital Sign    Days of Exercise per Week: 7 days    Minutes of Exercise per Session: 30 min  Stress: No Stress Concern Present (01/09/2022)   [place]    Feeling of Stress : Not at all  Social Connections: Moderately Integrated (01/03/2021)   Social Connection and Isolation Panel [NHANES]    Frequency of Communication with  Friends and Family: Three times a week    Frequency of Social Gatherings with Friends and Family: Three times a week    Attends Religious Services: More than 4 times per year    Active Member of Clubs or Organizations: No    Attends Archivist Meetings: Never    Marital Status: Married   Vitals:   03/27/22 1557  BP: 132/80  Pulse: 85  Resp: 16  SpO2: 99%   Body mass index is 40.69 kg/m.  Physical Exam Vitals and nursing note reviewed.  Constitutional:      General: He is not in acute distress.    Appearance: He is well-developed.  HENT:     Head: Normocephalic and atraumatic.  Eyes:     Conjunctiva/sclera: Conjunctivae normal.  Pulmonary:     Effort: Pulmonary effort is normal. No respiratory distress.     Breath sounds: Normal breath sounds.  Abdominal:     Tenderness: There is abdominal tenderness in the right lower quadrant. There is guarding. There is no rebound.  Genitourinary:    Comments: Right testicle very tender with light tough, ? Induration ad local heat. Examination limited due to difficulty standng with no assistance.  Left testicle with no tenderness, scrotal edema, soft. Could not evaluate for hernia due to pain (rght), could not access left. Skin:    General: Skin is warm.     Findings: No erythema or rash.  Neurological:     Mental Status: He is oriented to person, place, and time.     Comments: Unstable gait, he is in a wheel chair. Cannot walk even with assistance.  Psychiatric:        Mood and Affect: Mood is anxious.   ASSESSMENT AND PLAN:  [name] was seen today for groin pain, groin swelling and medication management.  Diagnoses and all orders for this visit:  Testicular pain, right With associated bilateral scrotal edema, left testicle/scrotum is not tender. Examination was limited, he could not lie on examination table or stand up for long time even with assistance (holding a chair and my assistance and daughter helping him to  stand up).   Pain elicited with light touch. We discussed possible etiologies, because level of pain I am concerned about a serious process like incarcerated hernia, orchitis/abscess.  He was instructed to go  to the ED, may need imaging and blood work as well as urology consultation.  Lower abdominal pain Not reported but noted during examination, suprapubic and RLQ, examination done while in his wheel chair, was not able to get on examination table.  May need pelvic/abdomen CT.  Essential hypertension BP adequately controlled today. After we discussed some, including increasing dose of carvedilol and discontinuing spironolactone.  He prefers to hold on medication changes for now given the fact headache has resolved. He will continue monitoring BP at home. Low-salt/DASH diet. Keep appointment with cardiologist on 04/13/2022. He will let me know if headache reoccurs.  I spent a total of 32 minutes in both face to face and non face to face activities for this visit on the date of this encounter. During this time history was obtained and documented, examination was performed, prior labs reviewed, and assessment/plan discussed.  Return if symptoms worsen or fail to improve, for Sent to the ED.Marland Kitchen   G. [name], MD  [facility]. [place] office.

## 2022-03-27 ENCOUNTER — Emergency Department (HOSPITAL_BASED_OUTPATIENT_CLINIC_OR_DEPARTMENT_OTHER): Payer: Medicare Other

## 2022-03-27 ENCOUNTER — Encounter: Payer: Self-pay | Admitting: Family Medicine

## 2022-03-27 ENCOUNTER — Ambulatory Visit (INDEPENDENT_AMBULATORY_CARE_PROVIDER_SITE_OTHER): Payer: Medicare Other | Admitting: Family Medicine

## 2022-03-27 ENCOUNTER — Encounter (HOSPITAL_BASED_OUTPATIENT_CLINIC_OR_DEPARTMENT_OTHER): Payer: Self-pay

## 2022-03-27 ENCOUNTER — Other Ambulatory Visit: Payer: Self-pay

## 2022-03-27 ENCOUNTER — Emergency Department (HOSPITAL_BASED_OUTPATIENT_CLINIC_OR_DEPARTMENT_OTHER)
Admission: EM | Admit: 2022-03-27 | Discharge: 2022-03-27 | Disposition: A | Discharge status: Home / Self Care | Payer: Medicare Other | Attending: Emergency Medicine | Admitting: Emergency Medicine

## 2022-03-27 VITALS — BP 132/80 | HR 85 | Resp 16 | Ht 72.0 in

## 2022-03-27 DIAGNOSIS — N50811 Right testicular pain: Secondary | ICD-10-CM | POA: Diagnosis not present

## 2022-03-27 DIAGNOSIS — N390 Urinary tract infection, site not specified: Secondary | ICD-10-CM | POA: Insufficient documentation

## 2022-03-27 DIAGNOSIS — Z79899 Other long term (current) drug therapy: Secondary | ICD-10-CM | POA: Diagnosis not present

## 2022-03-27 DIAGNOSIS — R1030 Lower abdominal pain, unspecified: Secondary | ICD-10-CM | POA: Diagnosis not present

## 2022-03-27 DIAGNOSIS — E119 Type 2 diabetes mellitus without complications: Secondary | ICD-10-CM | POA: Insufficient documentation

## 2022-03-27 DIAGNOSIS — N433 Hydrocele, unspecified: Secondary | ICD-10-CM | POA: Diagnosis not present

## 2022-03-27 DIAGNOSIS — I10 Essential (primary) hypertension: Secondary | ICD-10-CM | POA: Diagnosis not present

## 2022-03-27 DIAGNOSIS — Z7901 Long term (current) use of anticoagulants: Secondary | ICD-10-CM | POA: Diagnosis not present

## 2022-03-27 LAB — URINALYSIS, ROUTINE W REFLEX MICROSCOPIC
Bilirubin Urine: NEGATIVE
Glucose, UA: NEGATIVE mg/dL
Hgb urine dipstick: NEGATIVE
Ketones, ur: NEGATIVE mg/dL
Leukocytes,Ua: NEGATIVE
Nitrite: POSITIVE — AB
Protein, ur: NEGATIVE mg/dL
Specific Gravity, Urine: 1.02 (ref 1.005–1.030)
pH: 8 (ref 5.0–8.0)

## 2022-03-27 LAB — URINALYSIS, MICROSCOPIC (REFLEX)

## 2022-03-27 LAB — BASIC METABOLIC PANEL
Anion gap: 6 (ref 5–15)
BUN: 23 mg/dL (ref 8–23)
CO2: 28 mmol/L (ref 22–32)
Calcium: 10.2 mg/dL (ref 8.9–10.3)
Chloride: 107 mmol/L (ref 98–111)
Creatinine, Ser: 1.28 mg/dL — ABNORMAL HIGH (ref 0.61–1.24)
GFR, Estimated: >60 mL/min (ref >60)
Glucose, Bld: 96 mg/dL (ref 70–99)
Potassium: 4 mmol/L (ref 3.5–5.1)
Sodium: 141 mmol/L (ref 135–145)

## 2022-03-27 LAB — CBC
HCT: 28.8 % — ABNORMAL LOW (ref 39.0–52.0)
Hemoglobin: 9.3 g/dL — ABNORMAL LOW (ref 13.0–17.0)
MCH: 31.2 pg (ref 26.0–34.0)
MCHC: 32.3 g/dL (ref 30.0–36.0)
MCV: 96.6 fL (ref 80.0–100.0)
Platelets: 274 10*3/uL (ref 150–400)
RBC: 2.98 MIL/uL — ABNORMAL LOW (ref 4.22–5.81)
RDW: 14.5 % (ref 11.5–15.5)
WBC: 8.1 10*3/uL (ref 4.0–10.5)
nRBC: 0 % (ref 0.0–0.2)

## 2022-03-27 MED ORDER — CEPHALEXIN 500 MG PO CAPS: 500.0000 mg | ORAL_CAPSULE | Freq: Four times a day (QID) | ORAL | 0 refills | Status: DC

## 2022-03-27 MED ORDER — FENTANYL CITRATE PF 50 MCG/ML IJ SOSY
100.0000 ug | PREFILLED_SYRINGE | Freq: Once | INTRAMUSCULAR | Status: AC
  Administered 2022-03-27: 100 ug via INTRAVENOUS
  Filled 2022-03-27: qty 2

## 2022-03-27 MED ORDER — CEPHALEXIN 250 MG PO CAPS
500.0000 mg | ORAL_CAPSULE | Freq: Once | ORAL | Status: AC
  Administered 2022-03-27: 500 mg via ORAL
  Filled 2022-03-27: qty 2

## 2022-03-27 MED ORDER — HYDROCODONE-ACETAMINOPHEN 5-325 MG PO TABS: 1.0000 | ORAL_TABLET | Freq: Four times a day (QID) | ORAL | 0 refills | Status: DC | PRN

## 2022-03-27 NOTE — ED Provider Notes (Cosign Needed)
[facility] Provider Note   CSN: [id number] Arrival date & time: 03/27/22  1728     History  Chief Complaint  Patient presents with   Testicle Pain   Groin Swelling    Keith [name] is a 71 y.o. male with medical history significant for UTI, hypokalemia, back pain, diabetes, hypertension, obesity, osteoarthritis.  Patient presents to ED for evaluation of testicular pain.  Patient states that beginning on Friday he has had progressively worsening right-sided testicular pain.  Patient denies any fevers, nausea, vomiting, abdominal pain, dysuria, back pain.  Patient reports that he had catheter inserted back in November 2022 due to back surgery but denies any recent catheterizations.    Testicle Pain Pertinent negatives include no shortness of breath.       Home Medications Prior to Admission medications   Medication Sig Start Date End Date Taking? Authorizing Provider  cephALEXin (KEFLEX) 500 MG capsule Take 1 capsule (500 mg total) by mouth 4 (four) times daily. 03/27/22  Yes Azucena Cecil, PA-C  HYDROcodone-acetaminophen (NORCO/VICODIN) 5-325 MG tablet Take 1 tablet by mouth every 6 (six) hours as needed for severe pain. 03/27/22  Yes Azucena Cecil, PA-C  acetaminophen (TYLENOL) 325 MG tablet Take 1-2 tablets (325-650 mg total) by mouth every 4 (four) hours as needed for mild pain. 09/06/21   Love, Ivan Anchors, PA-C  apixaban (ELIQUIS) 5 MG TABS tablet Take 1 tablet (5 mg total) by mouth 2 (two) times daily. 03/09/22   Nita Sells, MD  Blood Glucose Monitoring Suppl (ACCU-CHEK GUIDE ME) w/Device KIT USE TO TEST BLOOD SUGARS 1-2 TIMES DAILY. 03/06/22   [name], [name], MD  carvedilol (COREG) 3.125 MG tablet Take 1 tablet (3.125 mg total) by mouth 2 (two) times daily with a meal. 03/06/22   Nita Sells, MD  DULoxetine (CYMBALTA) 60 MG capsule TAKE 1 CAPSULE BY MOUTH EVERY DAY 11/24/21   Lovorn, Jinny Blossom, MD  gabapentin (NEURONTIN)  300 MG capsule Take 1 capsule (300 mg total) by mouth 2 (two) times daily. 03/06/22   Nita Sells, MD  hydrocortisone (ANUSOL-HC) 2.5 % rectal cream Place 1 application. rectally as needed for hemorrhoids or anal itching.    [provider]  levETIRAcetam (KEPPRA) 250 MG tablet TAKE 1 TABLET BY MOUTH TWICE A DAY 11/24/21   Lovorn, Jinny Blossom, MD  Multiple Vitamin (MULITIVITAMIN WITH MINERALS) TABS Take 1 tablet by mouth daily with breakfast.    [provider]  naloxone (NARCAN) nasal spray 4 mg/0.1 mL 1 spray as needed (accidental overdose). 09/06/21   [provider]  oxyCODONE-acetaminophen (PERCOCET) 7.5-325 MG tablet Take 2 tablets by mouth every 6 (six) hours as needed for severe pain. Note that 120 pills were filled on 10/31--day prior to hospital admission. Taking this four times a day should be equal to using 10 mg three times a day as in the hospital. Can wean down as able. 09/06/21   Love, Ivan Anchors, PA-C  polyethylene glycol (MIRALAX / GLYCOLAX) 17 g packet Take 17 g by mouth daily as needed for mild constipation. 09/06/21   Love, Ivan Anchors, PA-C  Potassium 99 MG TABS Take 99 mg by mouth daily.    [provider]  simvastatin (ZOCOR) 40 MG tablet TAKE 1 TABLET BY MOUTH EVERY DAY 09/15/21   [name], [name], MD  spironolactone (ALDACTONE) 25 MG tablet Take 0.5 tablets (12.5 mg total) by mouth daily. 03/14/22   Joette Catching, PA-C  torsemide (DEMADEX) 20 MG tablet  Take 1 tablet (20 mg total) by mouth every Monday, Wednesday, and Friday. 03/07/22   Nita Sells, MD      Allergies    Aspirin and Lisinopril    Review of Systems   Review of Systems  Constitutional:  Negative for chills and fever.  Respiratory:  Negative for shortness of breath.   Genitourinary:  Positive for scrotal swelling and testicular pain. Negative for dysuria and flank pain.  Musculoskeletal:  Negative for back pain.  Neurological:  Negative for dizziness, weakness and  light-headedness.  All other systems reviewed and are negative.   Physical Exam Updated Vital Signs BP 132/67 (BP Location: Right Arm)   Pulse 82   Temp 99.9 F (37.7 C) (Oral)   Resp 16   Ht 6' (1.829 m)   Wt 134.3 kg   SpO2 95%   BMI 40.14 kg/m  Physical Exam Vitals and nursing note reviewed. Exam conducted with a chaperone present.  Constitutional:      General: He is not in acute distress.    Appearance: Normal appearance. He is not ill-appearing, toxic-appearing or diaphoretic.  HENT:     Head: Normocephalic and atraumatic.     Nose: Nose normal. No congestion.     Mouth/Throat:     Mouth: Mucous membranes are moist.     Pharynx: Oropharynx is clear.  Eyes:     Extraocular Movements: Extraocular movements intact.     Conjunctiva/sclera: Conjunctivae normal.     Pupils: Pupils are equal, round, and reactive to light.  Cardiovascular:     Rate and Rhythm: Normal rate and regular rhythm.  Pulmonary:     Effort: Pulmonary effort is normal.     Breath sounds: Normal breath sounds. No wheezing.  Abdominal:     General: Abdomen is flat. Bowel sounds are normal.     Palpations: Abdomen is soft.     Tenderness: There is no abdominal tenderness.  Genitourinary:    Testes:        Right: Swelling and testicular hydrocele present. Cremasteric reflex is present.         Left: Swelling and testicular hydrocele present. Cremasteric reflex is present.      Comments: Patient has what appears to be bilateral hydroceles.  There is transillumination present. Musculoskeletal:     Cervical back: Normal range of motion and neck supple. No tenderness.  Skin:    General: Skin is warm and dry.     Capillary Refill: Capillary refill takes less than 2 seconds.  Neurological:     Mental Status: He is alert and oriented to person, place, and time.     ED Results / Procedures / Treatments   Labs (all labs ordered are listed, but only abnormal results are displayed) Labs Reviewed  CBC  - Abnormal; Notable for the following components:      Result Value   RBC 2.98 (*)    Hemoglobin 9.3 (*)    HCT 28.8 (*)    All other components within normal limits  BASIC METABOLIC PANEL - Abnormal; Notable for the following components:   Creatinine, Ser 1.28 (*)    All other components within normal limits  URINALYSIS, ROUTINE W REFLEX MICROSCOPIC - Abnormal; Notable for the following components:   APPearance CLOUDY (*)    Nitrite POSITIVE (*)    All other components within normal limits  URINALYSIS, MICROSCOPIC (REFLEX) - Abnormal; Notable for the following components:   Bacteria, UA MANY (*)    All other components within  normal limits    EKG None  Radiology US SCROTUM DOPPLER  Result Date: 03/27/2022 CLINICAL DATA:  Testicular pain and swelling for several days, initial encounter EXAM: SCROTAL ULTRASOUND DOPPLER ULTRASOUND OF THE TESTICLES TECHNIQUE: Complete ultrasound examination of the testicles, epididymis, and other scrotal structures was performed. Color and spectral Doppler ultrasound were also utilized to evaluate blood flow to the testicles. COMPARISON:  None Available. FINDINGS: Right testicle Measurements: 4.6 x 3.1 x 3.2 cm. No mass or microlithiasis visualized. Adjacent to the right testicle there is 1.9 x 1.2 x 1.8 cm mixed echogenicity region without significant increased vascularity. Left testicle Measurements: 4.0 x 2.5 x 2.4 cm. No mass or microlithiasis visualized. Scrotal pearl is noted. Right epididymis:  Normal in size and appearance. Left epididymis:  Normal in size and appearance. Hydrocele:  Bilateral mildly complex hydroceles are noted Varicocele:  None visualized. Pulsed Doppler interrogation of both testes demonstrates normal low resistance arterial and venous waveforms bilaterally. IMPRESSION: Normal-appearing testicles bilaterally. Adjacent to the right testicle inferiorly there is a mixed echogenicity region as described. Given the patient's clinical  history of anticoagulation and recent exercise eliciting the pain these changes may represent a focal hematoma. No significant increased vascularity is noted to suggest epididymitis. Short-term follow-up to assess for resolution may be helpful. Bilateral complex hydroceles. Electronically Signed   By: Inez Catalina M.D.   On: 03/27/2022 19:28    Procedures Procedures   Medications Ordered in ED Medications  cephALEXin (KEFLEX) capsule 500 mg (has no administration in time range)  fentaNYL (SUBLIMAZE) injection 100 mcg (100 mcg Intravenous Given 03/27/22 2013)    ED Course/ Medical Decision Making/ A&P                           Medical Decision Making Amount and/or Complexity of Data Reviewed Labs: ordered.  Risk Prescription drug management.   71 year old male presents to ED for evaluation.  Please see HPI for further details.  On examination, the patient is afebrile and nontachycardic.  The patient lung sounds are clear bilaterally and he is not hypoxic on room air.  The patient's abdomen is soft and compressible in all 4 quadrants.  Patient testicles examined, chaperone present, have what appears to be bilateral hydroceles with transillumination.  Cremasteric reflex present.  The patient's testicles are nontender to palpation.  Patient worked up utilizing the following labs and imaging studies interpreted by me personally: - Urinalysis has positive nitrite urine - BNP has elevated creatinine of 1.28 which is consistent with patient baseline - CBC shows decreased hemoglobin at 9.3 however the patient denies any dark stool, lightheadedness, dizziness, weakness, shortness of breath - Ultrasound patient scrotum shows no signs of torsion, increased vascularity to suggest epididymitis however there is bilateral hydroceles  Patient will be started on Keflex for UTI.  Patient will be given first dose here tonight.  Patient will be referred to urology for further management of hydroceles.   Patient also advised due to decrease hemoglobin to have his labs rechecked by PCP in the next 3 to 5 days.  Patient given return precautions and he voices understanding.  The patient had all his questions answered his satisfaction.  Patient encouraged to follow-up with urology.  Patient also encouraged to follow-up with PCP for recheck of CBC.  The patient is stable at this time for discharge home.   Final Clinical Impression(s) / ED Diagnoses Final diagnoses:  Hydrocele, unspecified hydrocele type  Urinary tract infection  without hematuria, site unspecified    Rx / DC Orders ED Discharge Orders          Ordered    cephALEXin (KEFLEX) 500 MG capsule  4 times daily        03/27/22 2122    HYDROcodone-acetaminophen (NORCO/VICODIN) 5-325 MG tablet  Every 6 hours PRN        03/27/22 2124              Azucena Cecil, PA-C 03/27/22 2130

## 2022-03-27 NOTE — Patient Instructions (Signed)
A few things to remember from today's visit:   Testicular pain, right  Lower abdominal pain  If you need refills please call your pharmacy. Do not use My Chart to request refills or for acute issues that need immediate attention.   Severe pain, exam was limited due to difficulty standing up.  ? Incarcerated hernia, abscess. Recommend going to the ED , may need abdominal/pelvic CT and possible ultrasound.  Please be sure medication list is accurate. If a new problem present, please set up appointment sooner than planned today.

## 2022-03-27 NOTE — Telephone Encounter (Signed)
He is taking a low-dose of spironolactone (12.5 mg ) and has an appointment with his cardiologist on 04/13/2022. Last potassium 4.3 on 03/23/22. Thanks, BJ

## 2022-03-27 NOTE — ED Triage Notes (Signed)
Pt complains of R sided testicle pain and swelling since Saturday.

## 2022-03-27 NOTE — Discharge Instructions (Addendum)
Please return to the ED with any new symptom such as fevers bodyaches or chills Please follow-up with the urologist I referred you to.  You will need to call and make an appoint to be seen Please follow up with PCP in 3-5 days for CBC recheck due to decreased hemoglobin Please begin taking antibiotics I prescribed you. Please do not drive or operate heavy machinery under the influence of pain medication. Please read attached informational guide concerning hydroceles as well as urinary tract infections

## 2022-03-28 DIAGNOSIS — I1310 Hypertensive heart and chronic kidney disease without heart failure, with stage 1 through stage 4 chronic kidney disease, or unspecified chronic kidney disease: Secondary | ICD-10-CM | POA: Diagnosis not present

## 2022-03-28 DIAGNOSIS — M1990 Unspecified osteoarthritis, unspecified site: Secondary | ICD-10-CM | POA: Diagnosis not present

## 2022-03-28 DIAGNOSIS — M48061 Spinal stenosis, lumbar region without neurogenic claudication: Secondary | ICD-10-CM | POA: Diagnosis not present

## 2022-03-28 DIAGNOSIS — E785 Hyperlipidemia, unspecified: Secondary | ICD-10-CM | POA: Diagnosis not present

## 2022-03-28 DIAGNOSIS — N1831 Chronic kidney disease, stage 3a: Secondary | ICD-10-CM | POA: Diagnosis not present

## 2022-03-28 DIAGNOSIS — M419 Scoliosis, unspecified: Secondary | ICD-10-CM | POA: Diagnosis not present

## 2022-03-28 DIAGNOSIS — E1122 Type 2 diabetes mellitus with diabetic chronic kidney disease: Secondary | ICD-10-CM | POA: Diagnosis not present

## 2022-03-28 DIAGNOSIS — Z9981 Dependence on supplemental oxygen: Secondary | ICD-10-CM | POA: Diagnosis not present

## 2022-03-28 DIAGNOSIS — M5459 Other low back pain: Secondary | ICD-10-CM | POA: Diagnosis not present

## 2022-03-28 NOTE — Telephone Encounter (Signed)
I tried contacting Keith [name], but unable to leave a message. Phone number doesn't seem to belong to him.

## 2022-03-29 DIAGNOSIS — N453 Epididymo-orchitis: Secondary | ICD-10-CM | POA: Diagnosis not present

## 2022-03-30 DIAGNOSIS — E1122 Type 2 diabetes mellitus with diabetic chronic kidney disease: Secondary | ICD-10-CM | POA: Diagnosis not present

## 2022-03-30 DIAGNOSIS — I1310 Hypertensive heart and chronic kidney disease without heart failure, with stage 1 through stage 4 chronic kidney disease, or unspecified chronic kidney disease: Secondary | ICD-10-CM | POA: Diagnosis not present

## 2022-03-30 DIAGNOSIS — M1990 Unspecified osteoarthritis, unspecified site: Secondary | ICD-10-CM | POA: Diagnosis not present

## 2022-03-30 DIAGNOSIS — N1831 Chronic kidney disease, stage 3a: Secondary | ICD-10-CM | POA: Diagnosis not present

## 2022-03-30 DIAGNOSIS — M419 Scoliosis, unspecified: Secondary | ICD-10-CM | POA: Diagnosis not present

## 2022-03-30 DIAGNOSIS — M5459 Other low back pain: Secondary | ICD-10-CM | POA: Diagnosis not present

## 2022-03-30 DIAGNOSIS — Z9981 Dependence on supplemental oxygen: Secondary | ICD-10-CM | POA: Diagnosis not present

## 2022-03-30 DIAGNOSIS — E785 Hyperlipidemia, unspecified: Secondary | ICD-10-CM | POA: Diagnosis not present

## 2022-03-30 DIAGNOSIS — M48061 Spinal stenosis, lumbar region without neurogenic claudication: Secondary | ICD-10-CM | POA: Diagnosis not present

## 2022-04-02 ENCOUNTER — Ambulatory Visit (INDEPENDENT_AMBULATORY_CARE_PROVIDER_SITE_OTHER): Payer: Medicare Other | Admitting: Family Medicine

## 2022-04-02 ENCOUNTER — Encounter: Payer: Self-pay | Admitting: Family Medicine

## 2022-04-02 VITALS — BP 126/80 | HR 90 | Resp 16 | Ht 72.0 in

## 2022-04-02 DIAGNOSIS — N453 Epididymo-orchitis: Secondary | ICD-10-CM

## 2022-04-02 DIAGNOSIS — M5459 Other low back pain: Secondary | ICD-10-CM | POA: Diagnosis not present

## 2022-04-02 DIAGNOSIS — M1990 Unspecified osteoarthritis, unspecified site: Secondary | ICD-10-CM | POA: Diagnosis not present

## 2022-04-02 DIAGNOSIS — I1310 Hypertensive heart and chronic kidney disease without heart failure, with stage 1 through stage 4 chronic kidney disease, or unspecified chronic kidney disease: Secondary | ICD-10-CM | POA: Diagnosis not present

## 2022-04-02 DIAGNOSIS — N1831 Chronic kidney disease, stage 3a: Secondary | ICD-10-CM | POA: Diagnosis not present

## 2022-04-02 DIAGNOSIS — Z9981 Dependence on supplemental oxygen: Secondary | ICD-10-CM | POA: Diagnosis not present

## 2022-04-02 DIAGNOSIS — E1122 Type 2 diabetes mellitus with diabetic chronic kidney disease: Secondary | ICD-10-CM | POA: Diagnosis not present

## 2022-04-02 DIAGNOSIS — M48061 Spinal stenosis, lumbar region without neurogenic claudication: Secondary | ICD-10-CM | POA: Diagnosis not present

## 2022-04-02 DIAGNOSIS — D509 Iron deficiency anemia, unspecified: Secondary | ICD-10-CM

## 2022-04-02 DIAGNOSIS — M419 Scoliosis, unspecified: Secondary | ICD-10-CM | POA: Diagnosis not present

## 2022-04-02 DIAGNOSIS — N433 Hydrocele, unspecified: Secondary | ICD-10-CM

## 2022-04-02 DIAGNOSIS — E785 Hyperlipidemia, unspecified: Secondary | ICD-10-CM | POA: Diagnosis not present

## 2022-04-02 LAB — CBC WITH DIFFERENTIAL/PLATELET
Basophils Absolute: 0 10*3/uL (ref 0.0–0.1)
Basophils Relative: 0.5 % (ref 0.0–3.0)
Eosinophils Absolute: 0.3 10*3/uL (ref 0.0–0.7)
Eosinophils Relative: 3.7 % (ref 0.0–5.0)
HCT: 29.8 % — ABNORMAL LOW (ref 39.0–52.0)
Hemoglobin: 9.7 g/dL — ABNORMAL LOW (ref 13.0–17.0)
Lymphocytes Relative: 12.1 % (ref 12.0–46.0)
Lymphs Abs: 1 10*3/uL (ref 0.7–4.0)
MCHC: 32.5 g/dL (ref 30.0–36.0)
MCV: 96.2 fl (ref 78.0–100.0)
Monocytes Absolute: 1 10*3/uL (ref 0.1–1.0)
Monocytes Relative: 12.5 % — ABNORMAL HIGH (ref 3.0–12.0)
Neutro Abs: 5.7 10*3/uL (ref 1.4–7.7)
Neutrophils Relative %: 71.2 % (ref 43.0–77.0)
Platelets: 368 10*3/uL (ref 150.0–400.0)
RBC: 3.1 Mil/uL — ABNORMAL LOW (ref 4.22–5.81)
RDW: 14.9 % (ref 11.5–15.5)
WBC: 8 10*3/uL (ref 4.0–10.5)

## 2022-04-05 ENCOUNTER — Telehealth: Payer: Self-pay | Admitting: Pharmacist

## 2022-04-05 DIAGNOSIS — Z9981 Dependence on supplemental oxygen: Secondary | ICD-10-CM | POA: Diagnosis not present

## 2022-04-05 DIAGNOSIS — M1990 Unspecified osteoarthritis, unspecified site: Secondary | ICD-10-CM | POA: Diagnosis not present

## 2022-04-05 DIAGNOSIS — N1831 Chronic kidney disease, stage 3a: Secondary | ICD-10-CM | POA: Diagnosis not present

## 2022-04-05 DIAGNOSIS — E785 Hyperlipidemia, unspecified: Secondary | ICD-10-CM | POA: Diagnosis not present

## 2022-04-05 DIAGNOSIS — M5459 Other low back pain: Secondary | ICD-10-CM | POA: Diagnosis not present

## 2022-04-05 DIAGNOSIS — E1122 Type 2 diabetes mellitus with diabetic chronic kidney disease: Secondary | ICD-10-CM | POA: Diagnosis not present

## 2022-04-05 DIAGNOSIS — M48061 Spinal stenosis, lumbar region without neurogenic claudication: Secondary | ICD-10-CM | POA: Diagnosis not present

## 2022-04-05 DIAGNOSIS — M419 Scoliosis, unspecified: Secondary | ICD-10-CM | POA: Diagnosis not present

## 2022-04-05 DIAGNOSIS — I1310 Hypertensive heart and chronic kidney disease without heart failure, with stage 1 through stage 4 chronic kidney disease, or unspecified chronic kidney disease: Secondary | ICD-10-CM | POA: Diagnosis not present

## 2022-04-05 NOTE — Chronic Care Management (AMB) (Signed)
Chronic Care Management Pharmacy Assistant   Name: Keith [name]  MRN: [id number] DOB: 07-Apr-1951  Reason for Encounter: Medication Review   Recent office visits:  04/02/22 [name], [name], MD - Patient presented for Epididymorchitis without abscess and other concerns. No medication changes.  03/27/22 [name], [name], MD - Patient presented for Testicular pain and other concerns. No medication changes.  Recent consult visits:  03/23/22 Patient presented to [facility] for Lab work.  Hospital visits:  Medication Reconciliation was completed by comparing discharge summary, patient's EMR and Pharmacy list, and upon discussion with patient.  Patient presented to [facility] ED  on 03/27/22 due to Hydrocele and other concerns. Patient was present for 3 hours.  New?Medications Started at [facility] Discharge:?? -started  cephALEXin 500 MG HYDROcodone-acetaminophen 5-325  Medication Changes at Hospital Discharge: -Changed  none  Medications Discontinued at Hospital Discharge: -Stopped none  Medications that remain the same after Hospital Discharge:??  -All other medications will remain the same.    Medications: Outpatient Encounter Medications as of 04/05/2022  Medication Sig   acetaminophen (TYLENOL) 325 MG tablet Take 1-2 tablets (325-650 mg total) by mouth every 4 (four) hours as needed for mild pain.   apixaban (ELIQUIS) 5 MG TABS tablet Take 1 tablet (5 mg total) by mouth 2 (two) times daily.   Blood Glucose Monitoring Suppl (ACCU-CHEK GUIDE ME) w/Device KIT USE TO TEST BLOOD SUGARS 1-2 TIMES DAILY.   carvedilol (COREG) 3.125 MG tablet Take 1 tablet (3.125 mg total) by mouth 2 (two) times daily with a meal.   cephALEXin (KEFLEX) 500 MG capsule Take 1 capsule (500 mg total) by mouth 4 (four) times daily.   DULoxetine (CYMBALTA) 60 MG capsule TAKE 1 CAPSULE BY MOUTH EVERY DAY   gabapentin (NEURONTIN) 300 MG capsule Take 1 capsule (300 mg total) by  mouth 2 (two) times daily.   HYDROcodone-acetaminophen (NORCO/VICODIN) 5-325 MG tablet Take 1 tablet by mouth every 6 (six) hours as needed for severe pain.   hydrocortisone (ANUSOL-HC) 2.5 % rectal cream Place 1 application. rectally as needed for hemorrhoids or anal itching.   levETIRAcetam (KEPPRA) 250 MG tablet TAKE 1 TABLET BY MOUTH TWICE A DAY   Multiple Vitamin (MULITIVITAMIN WITH MINERALS) TABS Take 1 tablet by mouth daily with breakfast.   naloxone (NARCAN) nasal spray 4 mg/0.1 mL 1 spray as needed (accidental overdose).   oxyCODONE-acetaminophen (PERCOCET) 7.5-325 MG tablet Take 2 tablets by mouth every 6 (six) hours as needed for severe pain. Note that 120 pills were filled on 10/31--day prior to hospital admission. Taking this four times a day should be equal to using 10 mg three times a day as in the hospital. Can wean down as able.   polyethylene glycol (MIRALAX / GLYCOLAX) 17 g packet Take 17 g by mouth daily as needed for mild constipation.   Potassium 99 MG TABS Take 99 mg by mouth daily.   simvastatin (ZOCOR) 40 MG tablet TAKE 1 TABLET BY MOUTH EVERY DAY   spironolactone (ALDACTONE) 25 MG tablet Take 0.5 tablets (12.5 mg total) by mouth daily.   torsemide (DEMADEX) 20 MG tablet Take 1 tablet (20 mg total) by mouth every Monday, Wednesday, and Friday.   No facility-administered encounter medications on file as of 04/05/2022.   Notes: Patient placed call in reference to his Carvedilol, he reports that he has one dose left has taken his morning dose already and no refills on this medication that he was prescribed in  the hospital. Per Chart notes and verification by MP there are 2 fills left still of this medication at [facility] on [address], Per MP request they are processing a fill for him. Patient appreciative and aware to follow up with Pharmacist in Sept.   Care Gaps: [place] Zoster Vaccine - Postponed BP- 126/80 04/02/22 AWV- 4/23 CCM- 9/23 Lab  Results  Component Value Date   HGBA1C 6.6 (H) 01/17/2022    Star Rating Drugs: Simvastatin 40 mg - Last filled 03/15/22 90 DS at [facility] Pharmacist Assistant [phone]

## 2022-04-06 ENCOUNTER — Telehealth: Payer: Self-pay | Admitting: Family Medicine

## 2022-04-06 DIAGNOSIS — E1122 Type 2 diabetes mellitus with diabetic chronic kidney disease: Secondary | ICD-10-CM | POA: Diagnosis not present

## 2022-04-06 DIAGNOSIS — M48061 Spinal stenosis, lumbar region without neurogenic claudication: Secondary | ICD-10-CM | POA: Diagnosis not present

## 2022-04-06 DIAGNOSIS — R079 Chest pain, unspecified: Secondary | ICD-10-CM | POA: Diagnosis not present

## 2022-04-06 DIAGNOSIS — M1990 Unspecified osteoarthritis, unspecified site: Secondary | ICD-10-CM | POA: Diagnosis not present

## 2022-04-06 DIAGNOSIS — R748 Abnormal levels of other serum enzymes: Secondary | ICD-10-CM | POA: Diagnosis not present

## 2022-04-06 DIAGNOSIS — N1831 Chronic kidney disease, stage 3a: Secondary | ICD-10-CM | POA: Diagnosis not present

## 2022-04-06 DIAGNOSIS — I1310 Hypertensive heart and chronic kidney disease without heart failure, with stage 1 through stage 4 chronic kidney disease, or unspecified chronic kidney disease: Secondary | ICD-10-CM | POA: Diagnosis not present

## 2022-04-06 DIAGNOSIS — E785 Hyperlipidemia, unspecified: Secondary | ICD-10-CM | POA: Diagnosis not present

## 2022-04-06 DIAGNOSIS — M419 Scoliosis, unspecified: Secondary | ICD-10-CM | POA: Diagnosis not present

## 2022-04-06 DIAGNOSIS — M5459 Other low back pain: Secondary | ICD-10-CM | POA: Diagnosis not present

## 2022-04-06 DIAGNOSIS — R29898 Other symptoms and signs involving the musculoskeletal system: Secondary | ICD-10-CM | POA: Diagnosis not present

## 2022-04-06 DIAGNOSIS — Z9981 Dependence on supplemental oxygen: Secondary | ICD-10-CM | POA: Diagnosis not present

## 2022-04-06 NOTE — Telephone Encounter (Signed)
Verbal given to [employer].

## 2022-04-06 NOTE — Telephone Encounter (Signed)
It is okay to give verbal authorization to requested services. Last potassium was 4.0 on 03/27/2022. Complete antibiotic treatment as recommended by urologist. Thanks, BJ

## 2022-04-06 NOTE — Telephone Encounter (Signed)
[name] from [facility] call and stated she need a verbal order to continue PT 1 wk 4 and also stated drug interaction with his medication Fulsamethoxazole  800 mgTrimethoprim 160 mg and also spironolactone 25 mg interaction with Potassium citrate 99 mg . [name]  # is [phone].

## 2022-04-11 ENCOUNTER — Telehealth: Payer: Self-pay | Admitting: Family Medicine

## 2022-04-11 DIAGNOSIS — M1990 Unspecified osteoarthritis, unspecified site: Secondary | ICD-10-CM | POA: Diagnosis not present

## 2022-04-11 DIAGNOSIS — M48061 Spinal stenosis, lumbar region without neurogenic claudication: Secondary | ICD-10-CM | POA: Diagnosis not present

## 2022-04-11 DIAGNOSIS — M419 Scoliosis, unspecified: Secondary | ICD-10-CM | POA: Diagnosis not present

## 2022-04-11 DIAGNOSIS — E1122 Type 2 diabetes mellitus with diabetic chronic kidney disease: Secondary | ICD-10-CM | POA: Diagnosis not present

## 2022-04-11 DIAGNOSIS — M5459 Other low back pain: Secondary | ICD-10-CM | POA: Diagnosis not present

## 2022-04-11 DIAGNOSIS — N1831 Chronic kidney disease, stage 3a: Secondary | ICD-10-CM | POA: Diagnosis not present

## 2022-04-11 DIAGNOSIS — I1310 Hypertensive heart and chronic kidney disease without heart failure, with stage 1 through stage 4 chronic kidney disease, or unspecified chronic kidney disease: Secondary | ICD-10-CM | POA: Diagnosis not present

## 2022-04-11 DIAGNOSIS — E785 Hyperlipidemia, unspecified: Secondary | ICD-10-CM | POA: Diagnosis not present

## 2022-04-11 DIAGNOSIS — Z9981 Dependence on supplemental oxygen: Secondary | ICD-10-CM | POA: Diagnosis not present

## 2022-04-11 NOTE — Progress Notes (Signed)
ADVANCED HF CLINIC CONSULT NOTE  Primary Care: [name], [name], MD Primary Cardiologist: Dr. Doylene Canard HF Cardiologist: Dr. Aundra Dubin   HPI: Keith [name] is a 71 y.o.male with history saddle PE/LLE DVT 09/21 s/p TPA c/b hypotension requiring Levophed, multiple prior back surgeries, left adrenal adenoma, DM II, obesity, HTN.   Admitted 03/01/22 with CP. He was hypoxic requiring supplemental oxygen. V/Q scan with moderate to high probability of PE.  Lifelong DOAC recommended, started on Eliquis 5 BID (had been off eliquis since 02/22).    Echo: EF 55-60%, mild LVH, interventricular septum flattened in systole and diastole consistent with RV pressure and volume overload, RV severely enlarged with severely reduced function, RVSP 75 mmH, moderate to severe TR.    R/LHC by Dr. Doylene Canard: RA 12, PA 48/34 (39), PCWP mean 40, V wave 42, LVEDP 10, Fick CO/CI 5.93/2.33, Thermo CO/CI 2.38/0.93.    He was seen by CT surgery. Felt to be high risk for pulmonary embolectomy. He would need to be [facility] to [facility] for evaluation if indicated. Coarse c/b AKI, Scr peaked at 3.34 and improved to 1.60 (baseline). Started on torsemide 20 mg M, W, F at discharge. Atenolol/HTCZ stopped, continued coreg and clonidine.   Seen in [facility] 6/23, NYHA III and appeared volume up. Torsemide increased to 20 mg daily x 1 week then back to MWF.  Today he returns for AHF follow up with his wife, referred from [facility]. Overall feeling fine. Still has LE edema. Uses a WC when outside of the house, working with [facility] and was able to walk 40 steps today. He has SOB with exertion. Denies palpitations, abnormal bleeding, CP, dizziness,  or PND/Orthopnea. Appetite ok. No fever or chills. He has not weighed at home. Taking all medications. Recently treated with cephalexin for UTI.  ECG (personally reviewed): NSR 1st degree AVB, 87 bpm  Labs (6/23): K 4.0, creatinine 1.28  Cardiac Studies: - R/LHC (5/23): RA 12, PA 48/34 (39), PCWP  mean 40, V wave 42, LVEDP 10, Fick CO/CI 5.93/2.33, Thermo CO/CI 2.38/0.93.   - Echo (5/23): EF 55-60%, mild LVH, interventricular septum flattened in systole and diastole consistent with RV pressure and volume overload, RV severely enlarged with severely reduced function, RVSP 75 mmH, moderate to severe TR.    Review of Systems: [y] = yes, _0  = no   General: Weight gain _1 ; Weight loss _2 ; Anorexia _3 ; Fatigue Blue.Reese ]; Fever _4 ; Chills _5 ; Weakness _6   Cardiac: Chest pain/pressure _7 ; Resting SOB _8 ; Exertional SOB _9 ; Orthopnea _10 ; Pedal Edema Blue.Reese ]; Palpitations _11 ; Syncope _12 ; Presyncope _13 ; Paroxysmal nocturnal dyspnea_14   Pulmonary: Cough _15 ; Wheezing_16 ; Hemoptysis_17 ; Sputum _18 ; Snoring _19   GI: Vomiting_20 ; Dysphagia_21 ; Melena_22 ; Hematochezia _23 ; Heartburn_24 ; Abdominal pain _25 ; Constipation _26 ; Diarrhea _27 ; BRBPR _28   GU: Hematuria_29 ; Dysuria _30 ; Nocturia_31   Vascular: Pain in legs with walking _32 ; Pain in feet with lying flat _33 ; Non-healing sores _34 ; Stroke _35 ; TIA _36 ; Slurred speech _37 ;  Neuro: Headaches_38 ; Vertigo_39 ; Seizures_40 ; Paresthesias_41 ;Blurred vision _42 ; Diplopia _43 ; Vision changes _44   Ortho/Skin: Arthritis _45 ; Joint pain Blue.Reese ]; Muscle pain _46 ; Joint swelling _47 ; Back Pain _48 ; Rash _49   Psych: Depression_50 ; Anxiety_51   Heme: Bleeding problems _52 ;  Clotting disorders [ ] ; Anemia [ ]   Endocrine: Diabetes Blue.Reese ]; Thyroid dysfunction[ ]   Past Medical History:  Diagnosis Date   Allergy    Chronic back pain    Diabetes mellitus without complication ([place])    DNR (do not resuscitate) 11/19/2020   Duodenal ulcer hemorrhage 08/29/2014   ED (erectile dysfunction)    Esophageal stricture 08/30/2014   Glucose intolerance (impaired glucose tolerance)    Hiatal hernia 08/30/2014   Hypertension    Hypokalemia 04/11/2013   MRSA carrier 08/30/2014   Obesity    Osteoarthritis    Pneumonia    Pulmonary embolism ([place]) 06/2020   Scoliosis 2016    Spinal stenosis of lumbar region    Urinary tract infection 04/11/2013   Current Outpatient Medications  Medication Sig Dispense Refill   acetaminophen (TYLENOL) 325 MG tablet Take 1-2 tablets (325-650 mg total) by mouth every 4 (four) hours as needed for mild pain.     apixaban (ELIQUIS) 5 MG TABS tablet Take 1 tablet (5 mg total) by mouth 2 (two) times daily. 60 tablet 11   Blood Glucose Monitoring Suppl (ACCU-CHEK GUIDE ME) w/Device KIT USE TO TEST BLOOD SUGARS 1-2 TIMES DAILY. 1 kit 0   carvedilol (COREG) 3.125 MG tablet Take 1 tablet (3.125 mg total) by mouth 2 (two) times daily with a meal. 30 tablet 2   cephALEXin (KEFLEX) 500 MG capsule Take 1 capsule (500 mg total) by mouth 4 (four) times daily. 28 capsule 0   DULoxetine (CYMBALTA) 60 MG capsule TAKE 1 CAPSULE BY MOUTH EVERY DAY 90 capsule 2   gabapentin (NEURONTIN) 300 MG capsule Take 1 capsule (300 mg total) by mouth 2 (two) times daily. 60 capsule 3   HYDROcodone-acetaminophen (NORCO/VICODIN) 5-325 MG tablet Take 1 tablet by mouth every 6 (six) hours as needed for severe pain. 8 tablet 0   hydrocortisone (ANUSOL-HC) 2.5 % rectal cream Place 1 application. rectally as needed for hemorrhoids or anal itching.     levETIRAcetam (KEPPRA) 250 MG tablet TAKE 1 TABLET BY MOUTH TWICE A DAY 180 tablet 1   Multiple Vitamin (MULITIVITAMIN WITH MINERALS) TABS Take 1 tablet by mouth daily with breakfast.     naloxone (NARCAN) nasal spray 4 mg/0.1 mL 1 spray as needed (accidental overdose).     oxyCODONE-acetaminophen (PERCOCET) 7.5-325 MG tablet Take 2 tablets by mouth every 6 (six) hours as needed for severe pain. Note that 120 pills were filled on 10/31--day prior to hospital admission. Taking this four times a day should be equal to using 10 mg three times a day as in the hospital. Can wean down as able. 30 tablet 0   polyethylene glycol (MIRALAX / GLYCOLAX) 17 g packet Take 17 g by mouth daily as needed for mild constipation.     Potassium 99 MG  TABS Take 99 mg by mouth daily.     simvastatin (ZOCOR) 40 MG tablet TAKE 1 TABLET BY MOUTH EVERY DAY 90 tablet 2   spironolactone (ALDACTONE) 25 MG tablet Take 0.5 tablets (12.5 mg total) by mouth daily. 15 tablet 3   torsemide (DEMADEX) 20 MG tablet Take 1 tablet (20 mg total) by mouth every Monday, Wednesday, and Friday. 20 tablet 3   No current facility-administered medications for this encounter.   Allergies  Allergen Reactions   Aspirin Other (See Comments)    Irritates the stomach and the patient developed ulcers, also   Lisinopril Other (See Comments)    Caused a body ache   Social  History   Socioeconomic History   Marital status: Married    Spouse name: Not on file   Number of children: Not on file   Years of education: Not on file   Highest education level: Not on file  Occupational History   Occupation: retired  Tobacco Use   Smoking status: Former    Packs/day: 1.00    Years: 25.00    Total pack years: 25.00    Types: Cigarettes    Quit date: 1995    Years since quitting: 28.5   Smokeless tobacco: Never  Vaping Use   Vaping Use: Never used  Substance and Sexual Activity   Alcohol use: Not Currently    Comment: holidays and special occasions   Drug use: Yes    Types: Oxycodone   Sexual activity: Yes    Birth control/protection: None  Other Topics Concern   Not on file  Social History Narrative   Married.     Social Determinants of Health   Financial Resource Strain: Low Risk  (03/06/2022)   Overall Financial Resource Strain (CARDIA)    Difficulty of Paying Living Expenses: Not hard at all  Food Insecurity: No Food Insecurity (03/06/2022)   Hunger Vital Sign    Worried About Running Out of Food in the Last Year: Never true    Ran Out of Food in the Last Year: Never true  Transportation Needs: No Transportation Needs (03/06/2022)   PRAPARE - Hydrologist (Medical): No    Lack of Transportation (Non-Medical): No  Recent  Concern: Transportation Needs - Unmet Transportation Needs (12/27/2021)   PRAPARE - Hydrologist (Medical): Yes    Lack of Transportation (Non-Medical): No  Physical Activity: Sufficiently Active (01/09/2022)   Exercise Vital Sign    Days of Exercise per Week: 7 days    Minutes of Exercise per Session: 30 min  Stress: No Stress Concern Present (01/09/2022)   [place]    Feeling of Stress : Not at all  Social Connections: Moderately Integrated (01/03/2021)   Social Connection and Isolation Panel [NHANES]    Frequency of Communication with Friends and Family: Three times a week    Frequency of Social Gatherings with Friends and Family: Three times a week    Attends Religious Services: More than 4 times per year    Active Member of Clubs or Organizations: No    Attends Archivist Meetings: Never    Marital Status: Married  Human resources officer Violence: Not At Risk (01/03/2021)   Humiliation, Afraid, Rape, and Kick questionnaire    Fear of Current or Ex-Partner: No    Emotionally Abused: No    Physically Abused: No    Sexually Abused: No   Family History  Problem Relation Age of Onset   Diabetes Mother    Asthma Mother    Hypertension Father    Stroke Father    Colon cancer Neg Hx    BP 130/72   Pulse 97   Wt 130.8 kg (288 lb 6.4 oz)   SpO2 96%   BMI 39.11 kg/m   Wt Readings from Last 3 Encounters:  04/13/22 130.8 kg (288 lb 6.4 oz)  03/27/22 134.3 kg (296 lb)  03/14/22 136.1 kg (300 lb)   PHYSICAL EXAM: General:  NAD. No resp difficulty, arrived in [facility] on oxygen HEENT: Normal Neck: Supple. Thick neck. Carotids 2+ bilat; no bruits. No lymphadenopathy or  thryomegaly appreciated. Cor: PMI nondisplaced. Regular rate & rhythm. No rubs, gallops, 2/6 TR Lungs: Clear Abdomen: Obese, nontender, nondistended. No hepatosplenomegaly. No bruits or masses. Good bowel sounds. Extremities:  No cyanosis, clubbing, rash, 1-2+ BLE edema to knees Neuro: Alert & oriented x 3, cranial nerves grossly intact. Moves all 4 extremities w/o difficulty. Affect pleasant.  ASSESSMENT & PLAN: Chronic diastolic CHF with RV failure - Echo 05/23: EF 55-60%, RV severely reduced, RVSP 75 mmHg, moderate to severe TR - R/LHC 05/23: RA 12, PA 48/34 (39), PCWP mean 40, V wave 42, LVEDP 10, Fick CO/CI 5.93/2.33, Thermo CO/CI 2.38/0.93.  - Suspect RV failure primarily d/t PE - Reviewed records from recent hospitalization with Dr. Aundra Dubin. No acute need for referral for pulmonary embolectomy.  - Repeat echo and V/Q scan in 3 months, depending on results will consider repeat RHC. - NYHA III, functional class difficult due to physical deconditioning. He is mildy volume overloaded, although weight down 11 lbs from previous visit. - Start Entresto 24/26 mg bid. BMET today, repeat in 10-14 days. - Keep torsemide 20 mg MWF for now. - Continue spironolactone 12.5 mg daily. - Continue coreg 3.125 mg bid. - Hold off on SGLT2i with recent UTI  2. Pulmonary hypertension - Suspect predominately group IV.  - RHC in 5/23 as above. - Diurese.   3. Tricuspid regurgitation - Moderate to severe on echo 05/23. In setting of RV failure/PE. - Reassess on echo in 3 months   4. Hx PE/DVT - Hx PE/DVT 09/21 s/p TPA, required pressor support with NE - V/Q scan 05/23 with moderate to high probability PE - Reviewed records from recent hospitalization with Dr. Aundra Dubin. No acute need for referral for pulmonary embolectomy.  - Will need lifelong anticoagulation, on Eliquis 5 bid. No bleeding issues.   5. CKD IIIa/recent AKI - Scr up to 3.3 during recent admit, improved to baseline (1.6) - Labs today   6. HTN - BP stable. - Start [place] as above.   7. Morbid obesity - Body mass index is 39.11 kg/m. - Continue weight loss efforts.  - Congratulated patient on changes to diet.   Follow up in 3 weeks with PharmD (add  SGLT2i) and 2 months with Dr. Aundra Dubin + echo, as scheduled.  Allena Katz, FNP-BC 04/13/22

## 2022-04-11 NOTE — Telephone Encounter (Signed)
Will / [facility] calling in  Verbal orders for OT  Requesting Extension of Plan of Care -  2 times a week for 2 weeks, and  Once a week for 1 week

## 2022-04-11 NOTE — Telephone Encounter (Signed)
Verbal given to Will.

## 2022-04-13 ENCOUNTER — Encounter (HOSPITAL_COMMUNITY): Payer: Self-pay

## 2022-04-13 ENCOUNTER — Ambulatory Visit: Payer: Medicare Other | Admitting: Physical Medicine and Rehabilitation

## 2022-04-13 ENCOUNTER — Telehealth (HOSPITAL_COMMUNITY): Payer: Self-pay

## 2022-04-13 ENCOUNTER — Other Ambulatory Visit (HOSPITAL_COMMUNITY): Payer: Self-pay

## 2022-04-13 ENCOUNTER — Ambulatory Visit (HOSPITAL_COMMUNITY)
Admission: RE | Admit: 2022-04-13 | Discharge: 2022-04-13 | Disposition: A | Discharge status: Home / Self Care | Payer: Medicare Other | Source: Ambulatory Visit | Attending: Family Medicine | Admitting: Family Medicine

## 2022-04-13 VITALS — BP 130/72 | HR 97 | Wt 288.4 lb

## 2022-04-13 DIAGNOSIS — Z86711 Personal history of pulmonary embolism: Secondary | ICD-10-CM

## 2022-04-13 DIAGNOSIS — I272 Pulmonary hypertension, unspecified: Secondary | ICD-10-CM | POA: Diagnosis not present

## 2022-04-13 DIAGNOSIS — I361 Nonrheumatic tricuspid (valve) insufficiency: Secondary | ICD-10-CM | POA: Diagnosis not present

## 2022-04-13 DIAGNOSIS — N1831 Chronic kidney disease, stage 3a: Secondary | ICD-10-CM

## 2022-04-13 DIAGNOSIS — I10 Essential (primary) hypertension: Secondary | ICD-10-CM

## 2022-04-13 DIAGNOSIS — R0602 Shortness of breath: Secondary | ICD-10-CM | POA: Insufficient documentation

## 2022-04-13 DIAGNOSIS — Z7901 Long term (current) use of anticoagulants: Secondary | ICD-10-CM | POA: Insufficient documentation

## 2022-04-13 DIAGNOSIS — M1990 Unspecified osteoarthritis, unspecified site: Secondary | ICD-10-CM | POA: Diagnosis not present

## 2022-04-13 DIAGNOSIS — E1122 Type 2 diabetes mellitus with diabetic chronic kidney disease: Secondary | ICD-10-CM | POA: Insufficient documentation

## 2022-04-13 DIAGNOSIS — R609 Edema, unspecified: Secondary | ICD-10-CM | POA: Insufficient documentation

## 2022-04-13 DIAGNOSIS — Z86718 Personal history of other venous thrombosis and embolism: Secondary | ICD-10-CM | POA: Insufficient documentation

## 2022-04-13 DIAGNOSIS — Z6839 Body mass index (BMI) 39.0-39.9, adult: Secondary | ICD-10-CM | POA: Diagnosis not present

## 2022-04-13 DIAGNOSIS — Z79899 Other long term (current) drug therapy: Secondary | ICD-10-CM | POA: Diagnosis not present

## 2022-04-13 DIAGNOSIS — Z87440 Personal history of urinary (tract) infections: Secondary | ICD-10-CM | POA: Diagnosis not present

## 2022-04-13 DIAGNOSIS — I5032 Chronic diastolic (congestive) heart failure: Secondary | ICD-10-CM

## 2022-04-13 DIAGNOSIS — M419 Scoliosis, unspecified: Secondary | ICD-10-CM | POA: Diagnosis not present

## 2022-04-13 DIAGNOSIS — I130 Hypertensive heart and chronic kidney disease with heart failure and stage 1 through stage 4 chronic kidney disease, or unspecified chronic kidney disease: Secondary | ICD-10-CM | POA: Insufficient documentation

## 2022-04-13 DIAGNOSIS — E785 Hyperlipidemia, unspecified: Secondary | ICD-10-CM | POA: Diagnosis not present

## 2022-04-13 DIAGNOSIS — Z9981 Dependence on supplemental oxygen: Secondary | ICD-10-CM | POA: Diagnosis not present

## 2022-04-13 DIAGNOSIS — M5459 Other low back pain: Secondary | ICD-10-CM | POA: Diagnosis not present

## 2022-04-13 DIAGNOSIS — I1310 Hypertensive heart and chronic kidney disease without heart failure, with stage 1 through stage 4 chronic kidney disease, or unspecified chronic kidney disease: Secondary | ICD-10-CM | POA: Diagnosis not present

## 2022-04-13 DIAGNOSIS — M48061 Spinal stenosis, lumbar region without neurogenic claudication: Secondary | ICD-10-CM | POA: Diagnosis not present

## 2022-04-13 LAB — BASIC METABOLIC PANEL
Anion gap: 11 (ref 5–15)
BUN: 24 mg/dL — ABNORMAL HIGH (ref 8–23)
CO2: 23 mmol/L (ref 22–32)
Calcium: 10.5 mg/dL — ABNORMAL HIGH (ref 8.9–10.3)
Chloride: 104 mmol/L (ref 98–111)
Creatinine, Ser: 1.83 mg/dL — ABNORMAL HIGH (ref 0.61–1.24)
GFR, Estimated: 39 mL/min — ABNORMAL LOW (ref >60)
Glucose, Bld: 104 mg/dL — ABNORMAL HIGH (ref 70–99)
Potassium: 6 mmol/L — ABNORMAL HIGH (ref 3.5–5.1)
Sodium: 138 mmol/L (ref 135–145)

## 2022-04-13 LAB — BRAIN NATRIURETIC PEPTIDE: B Natriuretic Peptide: 20.1 pg/mL (ref 0.0–100.0)

## 2022-04-13 MED ORDER — ENTRESTO 24-26 MG PO TABS: 1.0000 | ORAL_TABLET | Freq: Two times a day (BID) | ORAL | 11 refills | Status: DC

## 2022-04-13 NOTE — Telephone Encounter (Addendum)
Patient's labs has been ordered and lab appointment scheduled. Med changes has been up dated to reflect these changes.  Patient's Lokelma sample has been placed up front for his wife to pick up today.  Medication Samples have been provided to the patient.  Drug name: Lokelma       Strength: 10 mg        Qty: 1  LOT: [id number]  Exp.Date: 12/06/23  Dosing instructions: Take 1 packet today by mouth.  Pt aware, agreeable, and verbalized understanding   The patient has been instructed regarding the correct time, dose, and frequency of taking this medication, including desired effects and most common side effects.   Tiney Rouge Chrishon Martino 4:31 PM 04/13/2022

## 2022-04-13 NOTE — Patient Instructions (Signed)
EKG done today.  Labs done today. We will contact you only if your labs are abnormal.  START Entresto 24-'26mg'$  (1 tablet) by mouth 2 times daily.   No other medication changes were made. Please continue all current medications as prescribed.  Your physician recommends that you schedule a follow-up appointment in: 3 weeks for an appointment with our Clinic Pharmacist and keep your appointment with Dr. Aundra Dubin.   If you have any questions or concerns before your next appointment please send [place] a message through [place] or call our office at [phone].    TO LEAVE A MESSAGE FOR THE NURSE SELECT OPTION 2, PLEASE LEAVE A MESSAGE INCLUDING: YOUR NAME DATE OF BIRTH CALL BACK NUMBER REASON FOR CALL**this is important as we prioritize the call backs  YOU WILL RECEIVE A CALL BACK THE SAME DAY AS LONG AS YOU CALL BEFORE 4:00 PM   Do the following things EVERYDAY: Weigh yourself in the morning before breakfast. Write it down and keep it in a log. Take your medicines as prescribed Eat low salt foods--Limit salt (sodium) to 2000 mg per day.  Stay as active as you can everyday Limit all fluids for the day to less than 2 liters   At the [facility], you and your health needs are our priority. As part of our continuing mission to provide you with exceptional heart care, we have created designated Provider Care Teams. These Care Teams include your primary Cardiologist (physician) and Advanced Practice Providers (APPs- Physician Assistants and Nurse Practitioners) who all work together to provide you with the care you need, when you need it.   You may see any of the following providers on your designated Care Team at your next follow up: Dr Glori Bickers Dr Haynes Kerns, NP Lyda Jester, [place] Audry Riles, PharmD   Please be sure to bring in all your medications bottles to every appointment.

## 2022-04-13 NOTE — Telephone Encounter (Signed)
-----   Message from Rafael Bihari,  sent at 04/13/2022  3:13 PM EDT ----- K is too high, kidney function elevated.  Stop spiro and do NOT start Entresto as discussed today.  Take Lokelma 10 g x 1 today.  Needs repeat BMET on Monday 04/16/22. Please call.

## 2022-04-15 DIAGNOSIS — M4714 Other spondylosis with myelopathy, thoracic region: Secondary | ICD-10-CM | POA: Diagnosis not present

## 2022-04-16 ENCOUNTER — Ambulatory Visit (HOSPITAL_COMMUNITY)
Admission: RE | Admit: 2022-04-16 | Discharge: 2022-04-16 | Disposition: A | Discharge status: Home / Self Care | Payer: Medicare Other | Source: Ambulatory Visit | Attending: Internal Medicine | Admitting: Internal Medicine

## 2022-04-16 DIAGNOSIS — I10 Essential (primary) hypertension: Secondary | ICD-10-CM | POA: Diagnosis not present

## 2022-04-16 LAB — BASIC METABOLIC PANEL
Anion gap: 11 (ref 5–15)
BUN: 30 mg/dL — ABNORMAL HIGH (ref 8–23)
CO2: 21 mmol/L — ABNORMAL LOW (ref 22–32)
Calcium: 9.6 mg/dL (ref 8.9–10.3)
Chloride: 106 mmol/L (ref 98–111)
Creatinine, Ser: 1.91 mg/dL — ABNORMAL HIGH (ref 0.61–1.24)
GFR, Estimated: 37 mL/min — ABNORMAL LOW (ref >60)
Glucose, Bld: 103 mg/dL — ABNORMAL HIGH (ref 70–99)
Potassium: 4.7 mmol/L (ref 3.5–5.1)
Sodium: 138 mmol/L (ref 135–145)

## 2022-04-17 DIAGNOSIS — I1310 Hypertensive heart and chronic kidney disease without heart failure, with stage 1 through stage 4 chronic kidney disease, or unspecified chronic kidney disease: Secondary | ICD-10-CM | POA: Diagnosis not present

## 2022-04-17 DIAGNOSIS — M1990 Unspecified osteoarthritis, unspecified site: Secondary | ICD-10-CM | POA: Diagnosis not present

## 2022-04-17 DIAGNOSIS — E1122 Type 2 diabetes mellitus with diabetic chronic kidney disease: Secondary | ICD-10-CM | POA: Diagnosis not present

## 2022-04-17 DIAGNOSIS — Z9981 Dependence on supplemental oxygen: Secondary | ICD-10-CM | POA: Diagnosis not present

## 2022-04-17 DIAGNOSIS — M419 Scoliosis, unspecified: Secondary | ICD-10-CM | POA: Diagnosis not present

## 2022-04-17 DIAGNOSIS — N1831 Chronic kidney disease, stage 3a: Secondary | ICD-10-CM | POA: Diagnosis not present

## 2022-04-17 DIAGNOSIS — M48061 Spinal stenosis, lumbar region without neurogenic claudication: Secondary | ICD-10-CM | POA: Diagnosis not present

## 2022-04-17 DIAGNOSIS — M5459 Other low back pain: Secondary | ICD-10-CM | POA: Diagnosis not present

## 2022-04-17 DIAGNOSIS — E785 Hyperlipidemia, unspecified: Secondary | ICD-10-CM | POA: Diagnosis not present

## 2022-04-18 ENCOUNTER — Encounter (HOSPITAL_COMMUNITY): Payer: Medicare Other

## 2022-04-18 ENCOUNTER — Telehealth: Payer: Self-pay | Admitting: Family Medicine

## 2022-04-18 DIAGNOSIS — G8929 Other chronic pain: Secondary | ICD-10-CM

## 2022-04-18 DIAGNOSIS — G9520 Unspecified cord compression: Secondary | ICD-10-CM

## 2022-04-18 NOTE — Telephone Encounter (Signed)
Pt is calling and his pain management md is retiring and will not longer write for his oxycodone. Pt would like to know should her get another pain management doc or would dr [name] write for his oxycodone. Pt is taking medication due to chronic back pain.

## 2022-04-19 DIAGNOSIS — E1122 Type 2 diabetes mellitus with diabetic chronic kidney disease: Secondary | ICD-10-CM | POA: Diagnosis not present

## 2022-04-19 DIAGNOSIS — N1831 Chronic kidney disease, stage 3a: Secondary | ICD-10-CM | POA: Diagnosis not present

## 2022-04-19 DIAGNOSIS — M419 Scoliosis, unspecified: Secondary | ICD-10-CM | POA: Diagnosis not present

## 2022-04-19 DIAGNOSIS — M1990 Unspecified osteoarthritis, unspecified site: Secondary | ICD-10-CM | POA: Diagnosis not present

## 2022-04-19 DIAGNOSIS — E785 Hyperlipidemia, unspecified: Secondary | ICD-10-CM | POA: Diagnosis not present

## 2022-04-19 DIAGNOSIS — M5459 Other low back pain: Secondary | ICD-10-CM | POA: Diagnosis not present

## 2022-04-19 DIAGNOSIS — I1310 Hypertensive heart and chronic kidney disease without heart failure, with stage 1 through stage 4 chronic kidney disease, or unspecified chronic kidney disease: Secondary | ICD-10-CM | POA: Diagnosis not present

## 2022-04-19 DIAGNOSIS — Z9981 Dependence on supplemental oxygen: Secondary | ICD-10-CM | POA: Diagnosis not present

## 2022-04-19 DIAGNOSIS — M48061 Spinal stenosis, lumbar region without neurogenic claudication: Secondary | ICD-10-CM | POA: Diagnosis not present

## 2022-04-20 NOTE — Telephone Encounter (Signed)
I called and spoke with patient. He would like to move forward with a new referral to a new office since his current one is retiring. Patient stated that he has not been without pain since at least 2014, especially with doing two different therapies now. Referral has been placed and patient is aware that he will receive a phone call to schedule his appointment. Patient agreed that he needed to continue following with pain management.

## 2022-04-20 NOTE — Telephone Encounter (Signed)
Recommend continuing with pain management. Most of the time when provider leaves, there is another one that takes over. If he needs a new referral it can be placed. Thanks, BJ

## 2022-04-23 ENCOUNTER — Ambulatory Visit (HOSPITAL_COMMUNITY)
Admission: RE | Admit: 2022-04-23 | Discharge: 2022-04-23 | Disposition: A | Discharge status: Home / Self Care | Payer: Medicare Other | Source: Ambulatory Visit | Attending: Internal Medicine | Admitting: Internal Medicine

## 2022-04-23 ENCOUNTER — Telehealth: Payer: Self-pay | Admitting: Family Medicine

## 2022-04-23 DIAGNOSIS — E785 Hyperlipidemia, unspecified: Secondary | ICD-10-CM | POA: Diagnosis not present

## 2022-04-23 DIAGNOSIS — M419 Scoliosis, unspecified: Secondary | ICD-10-CM | POA: Diagnosis not present

## 2022-04-23 DIAGNOSIS — M48061 Spinal stenosis, lumbar region without neurogenic claudication: Secondary | ICD-10-CM | POA: Diagnosis not present

## 2022-04-23 DIAGNOSIS — N1831 Chronic kidney disease, stage 3a: Secondary | ICD-10-CM | POA: Diagnosis not present

## 2022-04-23 DIAGNOSIS — M1990 Unspecified osteoarthritis, unspecified site: Secondary | ICD-10-CM | POA: Diagnosis not present

## 2022-04-23 DIAGNOSIS — I5032 Chronic diastolic (congestive) heart failure: Secondary | ICD-10-CM

## 2022-04-23 DIAGNOSIS — I5022 Chronic systolic (congestive) heart failure: Secondary | ICD-10-CM | POA: Diagnosis not present

## 2022-04-23 DIAGNOSIS — Z9981 Dependence on supplemental oxygen: Secondary | ICD-10-CM | POA: Diagnosis not present

## 2022-04-23 DIAGNOSIS — M5459 Other low back pain: Secondary | ICD-10-CM | POA: Diagnosis not present

## 2022-04-23 DIAGNOSIS — E1122 Type 2 diabetes mellitus with diabetic chronic kidney disease: Secondary | ICD-10-CM | POA: Diagnosis not present

## 2022-04-23 DIAGNOSIS — I1310 Hypertensive heart and chronic kidney disease without heart failure, with stage 1 through stage 4 chronic kidney disease, or unspecified chronic kidney disease: Secondary | ICD-10-CM | POA: Diagnosis not present

## 2022-04-23 LAB — BASIC METABOLIC PANEL
Anion gap: 9 (ref 5–15)
BUN: 19 mg/dL (ref 8–23)
CO2: 24 mmol/L (ref 22–32)
Calcium: 9.8 mg/dL (ref 8.9–10.3)
Chloride: 109 mmol/L (ref 98–111)
Creatinine, Ser: 1.69 mg/dL — ABNORMAL HIGH (ref 0.61–1.24)
GFR, Estimated: 43 mL/min — ABNORMAL LOW (ref >60)
Glucose, Bld: 124 mg/dL — ABNORMAL HIGH (ref 70–99)
Potassium: 4.1 mmol/L (ref 3.5–5.1)
Sodium: 142 mmol/L (ref 135–145)

## 2022-04-23 NOTE — Telephone Encounter (Signed)
Sent to [facility] on 7/14.

## 2022-04-23 NOTE — Telephone Encounter (Signed)
Pt called to ask if he can have a referral for a pain management doctor. His previous doctor is now retired and he needs pain management medication.   Please advise.   Pt  would like a call back at: [phone]

## 2022-04-24 ENCOUNTER — Telehealth: Payer: Self-pay | Admitting: Family Medicine

## 2022-04-24 NOTE — Telephone Encounter (Signed)
He needs to take BP daily for the past 7-10 days and let me know about readings. Thanks, BJ

## 2022-04-24 NOTE — Telephone Encounter (Signed)
I called and spoke with patient; he will take his BP daily & let us know the readings in 7 days. Also updated pt on his referral being sent out.

## 2022-04-24 NOTE — Telephone Encounter (Signed)
BP 164/97 a little higher initially , waited for medications to kick in. Pt had ham and pork that day. Next reading 2 hrs later by patient was 155/87. Most recent 144/74 this reading taken by patient

## 2022-04-25 ENCOUNTER — Telehealth: Payer: Self-pay | Admitting: Family Medicine

## 2022-04-25 DIAGNOSIS — M48061 Spinal stenosis, lumbar region without neurogenic claudication: Secondary | ICD-10-CM | POA: Diagnosis not present

## 2022-04-25 DIAGNOSIS — Z9981 Dependence on supplemental oxygen: Secondary | ICD-10-CM | POA: Diagnosis not present

## 2022-04-25 DIAGNOSIS — M1990 Unspecified osteoarthritis, unspecified site: Secondary | ICD-10-CM | POA: Diagnosis not present

## 2022-04-25 DIAGNOSIS — N1831 Chronic kidney disease, stage 3a: Secondary | ICD-10-CM | POA: Diagnosis not present

## 2022-04-25 DIAGNOSIS — M5459 Other low back pain: Secondary | ICD-10-CM | POA: Diagnosis not present

## 2022-04-25 DIAGNOSIS — E1122 Type 2 diabetes mellitus with diabetic chronic kidney disease: Secondary | ICD-10-CM | POA: Diagnosis not present

## 2022-04-25 DIAGNOSIS — M419 Scoliosis, unspecified: Secondary | ICD-10-CM | POA: Diagnosis not present

## 2022-04-25 DIAGNOSIS — E785 Hyperlipidemia, unspecified: Secondary | ICD-10-CM | POA: Diagnosis not present

## 2022-04-25 DIAGNOSIS — I1310 Hypertensive heart and chronic kidney disease without heart failure, with stage 1 through stage 4 chronic kidney disease, or unspecified chronic kidney disease: Secondary | ICD-10-CM | POA: Diagnosis not present

## 2022-04-25 NOTE — Telephone Encounter (Signed)
Blood pressure reading 162/88 currently. Patient states he has been taking his meds regularly and patient reports a systolic of 460 and 479 and diastolic of 98-72

## 2022-04-26 DIAGNOSIS — E1122 Type 2 diabetes mellitus with diabetic chronic kidney disease: Secondary | ICD-10-CM | POA: Diagnosis not present

## 2022-04-26 DIAGNOSIS — E785 Hyperlipidemia, unspecified: Secondary | ICD-10-CM | POA: Diagnosis not present

## 2022-04-26 DIAGNOSIS — Z9981 Dependence on supplemental oxygen: Secondary | ICD-10-CM | POA: Diagnosis not present

## 2022-04-26 DIAGNOSIS — M48061 Spinal stenosis, lumbar region without neurogenic claudication: Secondary | ICD-10-CM | POA: Diagnosis not present

## 2022-04-26 DIAGNOSIS — M1990 Unspecified osteoarthritis, unspecified site: Secondary | ICD-10-CM | POA: Diagnosis not present

## 2022-04-26 DIAGNOSIS — M5459 Other low back pain: Secondary | ICD-10-CM | POA: Diagnosis not present

## 2022-04-26 DIAGNOSIS — N1831 Chronic kidney disease, stage 3a: Secondary | ICD-10-CM | POA: Diagnosis not present

## 2022-04-26 DIAGNOSIS — I1310 Hypertensive heart and chronic kidney disease without heart failure, with stage 1 through stage 4 chronic kidney disease, or unspecified chronic kidney disease: Secondary | ICD-10-CM | POA: Diagnosis not present

## 2022-04-26 DIAGNOSIS — M419 Scoliosis, unspecified: Secondary | ICD-10-CM | POA: Diagnosis not present

## 2022-04-26 NOTE — Telephone Encounter (Signed)
Keith [name] with [phone] called to report the following -  Patient's BP at rest :  170/80  (manual cuff)  True resting, taken 5 minutes later:  164/80  As per Patient: Asking if will he need medication adjustment?  Please advise.

## 2022-04-27 MED ORDER — CARVEDILOL 6.25 MG PO TABS: 6.2500 mg | ORAL_TABLET | Freq: Two times a day (BID) | ORAL | 3 refills | Status: DC

## 2022-04-27 NOTE — Telephone Encounter (Signed)
The only antihypertensive medication I see on his med list is carvedilol 3.125 mg twice daily. We can increase dose of carvedilol from current dose to 6.25 mg twice daily. Continue monitoring BP and HR. Follow-up in 4 weeks. Thanks, BJ

## 2022-04-27 NOTE — Telephone Encounter (Signed)
I called and spoke with patient. We went over the information below & he verbalized understanding. New Rx sent in and patient will continue to monitor his BP & HR. He will see [place] in about 4 weeks.

## 2022-04-30 ENCOUNTER — Telehealth: Payer: Self-pay | Admitting: Family Medicine

## 2022-04-30 DIAGNOSIS — M48061 Spinal stenosis, lumbar region without neurogenic claudication: Secondary | ICD-10-CM | POA: Diagnosis not present

## 2022-04-30 DIAGNOSIS — M419 Scoliosis, unspecified: Secondary | ICD-10-CM | POA: Diagnosis not present

## 2022-04-30 DIAGNOSIS — M1990 Unspecified osteoarthritis, unspecified site: Secondary | ICD-10-CM | POA: Diagnosis not present

## 2022-04-30 DIAGNOSIS — E785 Hyperlipidemia, unspecified: Secondary | ICD-10-CM | POA: Diagnosis not present

## 2022-04-30 DIAGNOSIS — I1310 Hypertensive heart and chronic kidney disease without heart failure, with stage 1 through stage 4 chronic kidney disease, or unspecified chronic kidney disease: Secondary | ICD-10-CM | POA: Diagnosis not present

## 2022-04-30 DIAGNOSIS — Z9981 Dependence on supplemental oxygen: Secondary | ICD-10-CM | POA: Diagnosis not present

## 2022-04-30 DIAGNOSIS — M5459 Other low back pain: Secondary | ICD-10-CM | POA: Diagnosis not present

## 2022-04-30 DIAGNOSIS — M961 Postlaminectomy syndrome, not elsewhere classified: Secondary | ICD-10-CM | POA: Diagnosis not present

## 2022-04-30 DIAGNOSIS — E1122 Type 2 diabetes mellitus with diabetic chronic kidney disease: Secondary | ICD-10-CM | POA: Diagnosis not present

## 2022-04-30 DIAGNOSIS — N1831 Chronic kidney disease, stage 3a: Secondary | ICD-10-CM | POA: Diagnosis not present

## 2022-04-30 NOTE — Telephone Encounter (Signed)
VO given to Will. 

## 2022-04-30 NOTE — Telephone Encounter (Signed)
Will OT enhabit need verbal order for this week 1x1 for medicare evaluation

## 2022-05-02 ENCOUNTER — Other Ambulatory Visit (HOSPITAL_COMMUNITY): Payer: Medicare Other

## 2022-05-02 ENCOUNTER — Telehealth: Payer: Self-pay | Admitting: Family Medicine

## 2022-05-02 DIAGNOSIS — N1831 Chronic kidney disease, stage 3a: Secondary | ICD-10-CM | POA: Diagnosis not present

## 2022-05-02 DIAGNOSIS — E1122 Type 2 diabetes mellitus with diabetic chronic kidney disease: Secondary | ICD-10-CM | POA: Diagnosis not present

## 2022-05-02 DIAGNOSIS — Z9981 Dependence on supplemental oxygen: Secondary | ICD-10-CM | POA: Diagnosis not present

## 2022-05-02 DIAGNOSIS — M5459 Other low back pain: Secondary | ICD-10-CM | POA: Diagnosis not present

## 2022-05-02 DIAGNOSIS — M1990 Unspecified osteoarthritis, unspecified site: Secondary | ICD-10-CM | POA: Diagnosis not present

## 2022-05-02 DIAGNOSIS — M419 Scoliosis, unspecified: Secondary | ICD-10-CM | POA: Diagnosis not present

## 2022-05-02 DIAGNOSIS — E785 Hyperlipidemia, unspecified: Secondary | ICD-10-CM | POA: Diagnosis not present

## 2022-05-02 DIAGNOSIS — I1310 Hypertensive heart and chronic kidney disease without heart failure, with stage 1 through stage 4 chronic kidney disease, or unspecified chronic kidney disease: Secondary | ICD-10-CM | POA: Diagnosis not present

## 2022-05-02 DIAGNOSIS — M48061 Spinal stenosis, lumbar region without neurogenic claudication: Secondary | ICD-10-CM | POA: Diagnosis not present

## 2022-05-02 NOTE — Telephone Encounter (Signed)
VO given & she is aware pt's meds were changed.

## 2022-05-02 NOTE — Telephone Encounter (Signed)
Verbal Orders to extend PT 2 wks 3  1 wk 1  BP running higher in the a.m Lower in afternoon (140/80)  Heart rate after walking 20 ft:   111  Minimal SOB

## 2022-05-02 NOTE — Telephone Encounter (Signed)
Verbal authorization to requested services can be given. BP seems to be improved, carvedilol was just increased last week. Continue monitoring BP regularly. Follow-up is recommended. Thanks, BJ

## 2022-05-03 ENCOUNTER — Telehealth: Payer: Self-pay | Admitting: Family Medicine

## 2022-05-03 DIAGNOSIS — M1990 Unspecified osteoarthritis, unspecified site: Secondary | ICD-10-CM | POA: Diagnosis not present

## 2022-05-03 DIAGNOSIS — I1310 Hypertensive heart and chronic kidney disease without heart failure, with stage 1 through stage 4 chronic kidney disease, or unspecified chronic kidney disease: Secondary | ICD-10-CM | POA: Diagnosis not present

## 2022-05-03 DIAGNOSIS — M48061 Spinal stenosis, lumbar region without neurogenic claudication: Secondary | ICD-10-CM | POA: Diagnosis not present

## 2022-05-03 DIAGNOSIS — E1122 Type 2 diabetes mellitus with diabetic chronic kidney disease: Secondary | ICD-10-CM | POA: Diagnosis not present

## 2022-05-03 DIAGNOSIS — M419 Scoliosis, unspecified: Secondary | ICD-10-CM | POA: Diagnosis not present

## 2022-05-03 DIAGNOSIS — E785 Hyperlipidemia, unspecified: Secondary | ICD-10-CM | POA: Diagnosis not present

## 2022-05-03 DIAGNOSIS — M5459 Other low back pain: Secondary | ICD-10-CM | POA: Diagnosis not present

## 2022-05-03 DIAGNOSIS — Z9981 Dependence on supplemental oxygen: Secondary | ICD-10-CM | POA: Diagnosis not present

## 2022-05-03 DIAGNOSIS — N1831 Chronic kidney disease, stage 3a: Secondary | ICD-10-CM | POA: Diagnosis not present

## 2022-05-03 NOTE — Telephone Encounter (Signed)
Occupational therapy home health extend by 2x2/1x1

## 2022-05-04 NOTE — Telephone Encounter (Signed)
VO given to Will. 

## 2022-05-06 DIAGNOSIS — R748 Abnormal levels of other serum enzymes: Secondary | ICD-10-CM | POA: Diagnosis not present

## 2022-05-06 DIAGNOSIS — R29898 Other symptoms and signs involving the musculoskeletal system: Secondary | ICD-10-CM | POA: Diagnosis not present

## 2022-05-06 DIAGNOSIS — R079 Chest pain, unspecified: Secondary | ICD-10-CM | POA: Diagnosis not present

## 2022-05-07 ENCOUNTER — Telehealth: Payer: Self-pay | Admitting: Family Medicine

## 2022-05-07 DIAGNOSIS — M1990 Unspecified osteoarthritis, unspecified site: Secondary | ICD-10-CM | POA: Diagnosis not present

## 2022-05-07 DIAGNOSIS — Z9981 Dependence on supplemental oxygen: Secondary | ICD-10-CM | POA: Diagnosis not present

## 2022-05-07 DIAGNOSIS — E785 Hyperlipidemia, unspecified: Secondary | ICD-10-CM | POA: Diagnosis not present

## 2022-05-07 DIAGNOSIS — I1310 Hypertensive heart and chronic kidney disease without heart failure, with stage 1 through stage 4 chronic kidney disease, or unspecified chronic kidney disease: Secondary | ICD-10-CM | POA: Diagnosis not present

## 2022-05-07 DIAGNOSIS — M48061 Spinal stenosis, lumbar region without neurogenic claudication: Secondary | ICD-10-CM | POA: Diagnosis not present

## 2022-05-07 DIAGNOSIS — N1831 Chronic kidney disease, stage 3a: Secondary | ICD-10-CM | POA: Diagnosis not present

## 2022-05-07 DIAGNOSIS — E1122 Type 2 diabetes mellitus with diabetic chronic kidney disease: Secondary | ICD-10-CM | POA: Diagnosis not present

## 2022-05-07 DIAGNOSIS — M5459 Other low back pain: Secondary | ICD-10-CM | POA: Diagnosis not present

## 2022-05-07 DIAGNOSIS — M419 Scoliosis, unspecified: Secondary | ICD-10-CM | POA: Diagnosis not present

## 2022-05-07 NOTE — Telephone Encounter (Signed)
Blood pressure taken today is 164/100

## 2022-05-08 DIAGNOSIS — M1990 Unspecified osteoarthritis, unspecified site: Secondary | ICD-10-CM | POA: Diagnosis not present

## 2022-05-08 DIAGNOSIS — N1831 Chronic kidney disease, stage 3a: Secondary | ICD-10-CM | POA: Diagnosis not present

## 2022-05-08 DIAGNOSIS — M48061 Spinal stenosis, lumbar region without neurogenic claudication: Secondary | ICD-10-CM | POA: Diagnosis not present

## 2022-05-08 DIAGNOSIS — Z9981 Dependence on supplemental oxygen: Secondary | ICD-10-CM | POA: Diagnosis not present

## 2022-05-08 DIAGNOSIS — M5459 Other low back pain: Secondary | ICD-10-CM | POA: Diagnosis not present

## 2022-05-08 DIAGNOSIS — E785 Hyperlipidemia, unspecified: Secondary | ICD-10-CM | POA: Diagnosis not present

## 2022-05-08 DIAGNOSIS — M419 Scoliosis, unspecified: Secondary | ICD-10-CM | POA: Diagnosis not present

## 2022-05-08 DIAGNOSIS — I1310 Hypertensive heart and chronic kidney disease without heart failure, with stage 1 through stage 4 chronic kidney disease, or unspecified chronic kidney disease: Secondary | ICD-10-CM | POA: Diagnosis not present

## 2022-05-08 DIAGNOSIS — E1122 Type 2 diabetes mellitus with diabetic chronic kidney disease: Secondary | ICD-10-CM | POA: Diagnosis not present

## 2022-05-09 ENCOUNTER — Telehealth: Payer: Self-pay | Admitting: Family Medicine

## 2022-05-09 ENCOUNTER — Other Ambulatory Visit: Payer: Self-pay | Admitting: Family Medicine

## 2022-05-09 DIAGNOSIS — Z9981 Dependence on supplemental oxygen: Secondary | ICD-10-CM | POA: Diagnosis not present

## 2022-05-09 DIAGNOSIS — M419 Scoliosis, unspecified: Secondary | ICD-10-CM | POA: Diagnosis not present

## 2022-05-09 DIAGNOSIS — N1831 Chronic kidney disease, stage 3a: Secondary | ICD-10-CM | POA: Diagnosis not present

## 2022-05-09 DIAGNOSIS — M5459 Other low back pain: Secondary | ICD-10-CM | POA: Diagnosis not present

## 2022-05-09 DIAGNOSIS — M4714 Other spondylosis with myelopathy, thoracic region: Secondary | ICD-10-CM | POA: Diagnosis not present

## 2022-05-09 DIAGNOSIS — I10 Essential (primary) hypertension: Secondary | ICD-10-CM

## 2022-05-09 DIAGNOSIS — M1990 Unspecified osteoarthritis, unspecified site: Secondary | ICD-10-CM | POA: Diagnosis not present

## 2022-05-09 DIAGNOSIS — E785 Hyperlipidemia, unspecified: Secondary | ICD-10-CM | POA: Diagnosis not present

## 2022-05-09 DIAGNOSIS — M48061 Spinal stenosis, lumbar region without neurogenic claudication: Secondary | ICD-10-CM | POA: Diagnosis not present

## 2022-05-09 DIAGNOSIS — I1310 Hypertensive heart and chronic kidney disease without heart failure, with stage 1 through stage 4 chronic kidney disease, or unspecified chronic kidney disease: Secondary | ICD-10-CM | POA: Diagnosis not present

## 2022-05-09 DIAGNOSIS — E1122 Type 2 diabetes mellitus with diabetic chronic kidney disease: Secondary | ICD-10-CM | POA: Diagnosis not present

## 2022-05-09 MED ORDER — AMLODIPINE BESYLATE 5 MG PO TABS: 5.0000 mg | ORAL_TABLET | Freq: Every day | ORAL | 1 refills | Status: DC

## 2022-05-09 NOTE — Telephone Encounter (Signed)
See tel note 05/07/22. Keith Dionisio [name], MD

## 2022-05-09 NOTE — Telephone Encounter (Signed)
Blood pressure 170/96 waited, breathing for half hour 166/96. Pt put his own compression stockings on which may have elevated his BP

## 2022-05-09 NOTE — Telephone Encounter (Signed)
I spoke with patient. His BP was already back down to 141/80 when he checked it. He will start the amlodipine and follow up in 4 weeks.

## 2022-05-09 NOTE — Telephone Encounter (Signed)
Is he checking BP at home?  BP's reported by PT have been elevated. Coreg was recently increased but I do not want to go higher. Recommend adding Amlodipine 5 mg daily. No changes in rest. Continue monitoring BP regularly. I am sending Rx. F/U in 3-4 weeks. Thanks, BJ

## 2022-05-10 DIAGNOSIS — G8929 Other chronic pain: Secondary | ICD-10-CM | POA: Diagnosis not present

## 2022-05-10 DIAGNOSIS — M25571 Pain in right ankle and joints of right foot: Secondary | ICD-10-CM | POA: Diagnosis not present

## 2022-05-10 DIAGNOSIS — M129 Arthropathy, unspecified: Secondary | ICD-10-CM | POA: Diagnosis not present

## 2022-05-10 DIAGNOSIS — Z9181 History of falling: Secondary | ICD-10-CM | POA: Diagnosis not present

## 2022-05-10 DIAGNOSIS — M549 Dorsalgia, unspecified: Secondary | ICD-10-CM | POA: Diagnosis not present

## 2022-05-10 DIAGNOSIS — Z0000 Encounter for general adult medical examination without abnormal findings: Secondary | ICD-10-CM | POA: Diagnosis not present

## 2022-05-10 DIAGNOSIS — Z1159 Encounter for screening for other viral diseases: Secondary | ICD-10-CM | POA: Diagnosis not present

## 2022-05-10 DIAGNOSIS — I10 Essential (primary) hypertension: Secondary | ICD-10-CM | POA: Diagnosis not present

## 2022-05-10 DIAGNOSIS — E559 Vitamin D deficiency, unspecified: Secondary | ICD-10-CM | POA: Diagnosis not present

## 2022-05-10 DIAGNOSIS — Z131 Encounter for screening for diabetes mellitus: Secondary | ICD-10-CM | POA: Diagnosis not present

## 2022-05-10 DIAGNOSIS — Z79899 Other long term (current) drug therapy: Secondary | ICD-10-CM | POA: Diagnosis not present

## 2022-05-11 DIAGNOSIS — M48061 Spinal stenosis, lumbar region without neurogenic claudication: Secondary | ICD-10-CM | POA: Diagnosis not present

## 2022-05-11 DIAGNOSIS — M1990 Unspecified osteoarthritis, unspecified site: Secondary | ICD-10-CM | POA: Diagnosis not present

## 2022-05-11 DIAGNOSIS — M5459 Other low back pain: Secondary | ICD-10-CM | POA: Diagnosis not present

## 2022-05-11 DIAGNOSIS — Z9981 Dependence on supplemental oxygen: Secondary | ICD-10-CM | POA: Diagnosis not present

## 2022-05-11 DIAGNOSIS — M419 Scoliosis, unspecified: Secondary | ICD-10-CM | POA: Diagnosis not present

## 2022-05-11 DIAGNOSIS — E1122 Type 2 diabetes mellitus with diabetic chronic kidney disease: Secondary | ICD-10-CM | POA: Diagnosis not present

## 2022-05-11 DIAGNOSIS — N1831 Chronic kidney disease, stage 3a: Secondary | ICD-10-CM | POA: Diagnosis not present

## 2022-05-11 DIAGNOSIS — E785 Hyperlipidemia, unspecified: Secondary | ICD-10-CM | POA: Diagnosis not present

## 2022-05-11 DIAGNOSIS — I1310 Hypertensive heart and chronic kidney disease without heart failure, with stage 1 through stage 4 chronic kidney disease, or unspecified chronic kidney disease: Secondary | ICD-10-CM | POA: Diagnosis not present

## 2022-05-13 DIAGNOSIS — Z79899 Other long term (current) drug therapy: Secondary | ICD-10-CM | POA: Diagnosis not present

## 2022-05-14 ENCOUNTER — Other Ambulatory Visit (HOSPITAL_COMMUNITY): Payer: Medicare Other

## 2022-05-14 ENCOUNTER — Encounter (HOSPITAL_COMMUNITY): Payer: Medicare Other | Admitting: Internal Medicine

## 2022-05-14 DIAGNOSIS — M5459 Other low back pain: Secondary | ICD-10-CM | POA: Diagnosis not present

## 2022-05-14 DIAGNOSIS — M48061 Spinal stenosis, lumbar region without neurogenic claudication: Secondary | ICD-10-CM | POA: Diagnosis not present

## 2022-05-14 DIAGNOSIS — M419 Scoliosis, unspecified: Secondary | ICD-10-CM | POA: Diagnosis not present

## 2022-05-14 DIAGNOSIS — E1122 Type 2 diabetes mellitus with diabetic chronic kidney disease: Secondary | ICD-10-CM | POA: Diagnosis not present

## 2022-05-14 DIAGNOSIS — Z9981 Dependence on supplemental oxygen: Secondary | ICD-10-CM | POA: Diagnosis not present

## 2022-05-14 DIAGNOSIS — M1990 Unspecified osteoarthritis, unspecified site: Secondary | ICD-10-CM | POA: Diagnosis not present

## 2022-05-14 DIAGNOSIS — N1831 Chronic kidney disease, stage 3a: Secondary | ICD-10-CM | POA: Diagnosis not present

## 2022-05-14 DIAGNOSIS — I1310 Hypertensive heart and chronic kidney disease without heart failure, with stage 1 through stage 4 chronic kidney disease, or unspecified chronic kidney disease: Secondary | ICD-10-CM | POA: Diagnosis not present

## 2022-05-14 DIAGNOSIS — E785 Hyperlipidemia, unspecified: Secondary | ICD-10-CM | POA: Diagnosis not present

## 2022-05-14 NOTE — Telephone Encounter (Signed)
Caller states that the patient blood pressure reading is 160/98. He is picking up new blood pressure medication today --PT is at the home and advised that B/P is 160/98. Caller is seeing a pain medication MD and [facility] is not able to fill the pain medication.  05/11/2022 11:25:26 AM SEE PCP WITHIN 3 DAYS [place], RN, Levada Dy  Comments User: Saverio Danker, RN Date/Time Eilene Ghazi Time): 05/11/2022 11:23:51 AM Caller advised that Monday the office called in an additional B/P medication and the pharmacy just called him today and said that it was ready.  User: Saverio Danker, RN Date/Time Eilene Ghazi Time): 05/11/2022 11:27:37 AM Caller is on Gabapentin and tylenol for pain. Caller will call pain management about the pain medication. PT is going to be able to do therapy today. Caller is going to pick up the new medication and start today

## 2022-05-15 ENCOUNTER — Telehealth: Payer: Self-pay | Admitting: Family Medicine

## 2022-05-15 DIAGNOSIS — E1122 Type 2 diabetes mellitus with diabetic chronic kidney disease: Secondary | ICD-10-CM | POA: Diagnosis not present

## 2022-05-15 DIAGNOSIS — N1831 Chronic kidney disease, stage 3a: Secondary | ICD-10-CM | POA: Diagnosis not present

## 2022-05-15 DIAGNOSIS — M5459 Other low back pain: Secondary | ICD-10-CM | POA: Diagnosis not present

## 2022-05-15 DIAGNOSIS — I1310 Hypertensive heart and chronic kidney disease without heart failure, with stage 1 through stage 4 chronic kidney disease, or unspecified chronic kidney disease: Secondary | ICD-10-CM | POA: Diagnosis not present

## 2022-05-15 DIAGNOSIS — M1990 Unspecified osteoarthritis, unspecified site: Secondary | ICD-10-CM | POA: Diagnosis not present

## 2022-05-15 DIAGNOSIS — M419 Scoliosis, unspecified: Secondary | ICD-10-CM | POA: Diagnosis not present

## 2022-05-15 DIAGNOSIS — M48061 Spinal stenosis, lumbar region without neurogenic claudication: Secondary | ICD-10-CM | POA: Diagnosis not present

## 2022-05-15 DIAGNOSIS — E785 Hyperlipidemia, unspecified: Secondary | ICD-10-CM | POA: Diagnosis not present

## 2022-05-15 DIAGNOSIS — Z9981 Dependence on supplemental oxygen: Secondary | ICD-10-CM | POA: Diagnosis not present

## 2022-05-15 NOTE — Telephone Encounter (Signed)
fyi

## 2022-05-15 NOTE — Telephone Encounter (Signed)
OT reporting bp 156/100 no headache or sob

## 2022-05-15 NOTE — Telephone Encounter (Signed)
Keith [name] with Keith [name] [phone] Just wanted to keep MD in the loop: Pt started new BP meds (Amlodipine) but Pt had an Interaction with simvastatin

## 2022-05-16 DIAGNOSIS — M4714 Other spondylosis with myelopathy, thoracic region: Secondary | ICD-10-CM | POA: Diagnosis not present

## 2022-05-16 MED ORDER — ROSUVASTATIN CALCIUM 20 MG PO TABS: 20.0000 mg | ORAL_TABLET | Freq: Every day | ORAL | 3 refills | Status: DC

## 2022-05-16 NOTE — Telephone Encounter (Signed)
I spoke with patient. He is aware of the medication change and new Rx sent in for Crestor 20 mg.

## 2022-05-16 NOTE — Addendum Note (Signed)
Addended by: Nathanial Millman E on: 05/16/2022 11:08 AM   Modules accepted: Orders

## 2022-05-16 NOTE — Telephone Encounter (Signed)
That is correct, Amlodipine can increase side effects from Simvastatin. Let's change to Rosuvastatin 20 mg daily. Thanks, BJ

## 2022-05-16 NOTE — Telephone Encounter (Signed)
I spoke with patient. He has been continuing to monitor his BP and it has been in the 130s. He will set up a 4 week f/u for September.

## 2022-05-16 NOTE — Telephone Encounter (Signed)
He is on Carvedilol 6.25 mg bid and recently added Amlodipine 5 mg daily (05/09/22). Continue monitoring BP regularly and follow in the office as recommended. Thanks, BJ

## 2022-05-17 ENCOUNTER — Telehealth: Payer: Self-pay | Admitting: Family Medicine

## 2022-05-17 DIAGNOSIS — M419 Scoliosis, unspecified: Secondary | ICD-10-CM | POA: Diagnosis not present

## 2022-05-17 DIAGNOSIS — M48061 Spinal stenosis, lumbar region without neurogenic claudication: Secondary | ICD-10-CM | POA: Diagnosis not present

## 2022-05-17 DIAGNOSIS — E1122 Type 2 diabetes mellitus with diabetic chronic kidney disease: Secondary | ICD-10-CM | POA: Diagnosis not present

## 2022-05-17 DIAGNOSIS — I1310 Hypertensive heart and chronic kidney disease without heart failure, with stage 1 through stage 4 chronic kidney disease, or unspecified chronic kidney disease: Secondary | ICD-10-CM | POA: Diagnosis not present

## 2022-05-17 DIAGNOSIS — M1990 Unspecified osteoarthritis, unspecified site: Secondary | ICD-10-CM | POA: Diagnosis not present

## 2022-05-17 DIAGNOSIS — N1831 Chronic kidney disease, stage 3a: Secondary | ICD-10-CM | POA: Diagnosis not present

## 2022-05-17 DIAGNOSIS — E785 Hyperlipidemia, unspecified: Secondary | ICD-10-CM | POA: Diagnosis not present

## 2022-05-17 DIAGNOSIS — M5459 Other low back pain: Secondary | ICD-10-CM | POA: Diagnosis not present

## 2022-05-17 DIAGNOSIS — Z9981 Dependence on supplemental oxygen: Secondary | ICD-10-CM | POA: Diagnosis not present

## 2022-05-17 NOTE — Telephone Encounter (Signed)
Keith [name] with [facility] reporting That Patient's BP is 312/50  (Systolic over 871)   [phone]

## 2022-05-18 ENCOUNTER — Other Ambulatory Visit: Payer: Self-pay | Admitting: Physical Medicine and Rehabilitation

## 2022-05-18 NOTE — Telephone Encounter (Signed)
FYI. Patient has been taking his BP at home and it has been running in the 130s.

## 2022-05-22 ENCOUNTER — Telehealth: Payer: Self-pay | Admitting: Family Medicine

## 2022-05-22 DIAGNOSIS — M419 Scoliosis, unspecified: Secondary | ICD-10-CM | POA: Diagnosis not present

## 2022-05-22 DIAGNOSIS — Z9981 Dependence on supplemental oxygen: Secondary | ICD-10-CM | POA: Diagnosis not present

## 2022-05-22 DIAGNOSIS — E785 Hyperlipidemia, unspecified: Secondary | ICD-10-CM | POA: Diagnosis not present

## 2022-05-22 DIAGNOSIS — M48061 Spinal stenosis, lumbar region without neurogenic claudication: Secondary | ICD-10-CM | POA: Diagnosis not present

## 2022-05-22 DIAGNOSIS — I1310 Hypertensive heart and chronic kidney disease without heart failure, with stage 1 through stage 4 chronic kidney disease, or unspecified chronic kidney disease: Secondary | ICD-10-CM | POA: Diagnosis not present

## 2022-05-22 DIAGNOSIS — M1990 Unspecified osteoarthritis, unspecified site: Secondary | ICD-10-CM | POA: Diagnosis not present

## 2022-05-22 DIAGNOSIS — M5459 Other low back pain: Secondary | ICD-10-CM | POA: Diagnosis not present

## 2022-05-22 DIAGNOSIS — N1831 Chronic kidney disease, stage 3a: Secondary | ICD-10-CM | POA: Diagnosis not present

## 2022-05-22 DIAGNOSIS — E1122 Type 2 diabetes mellitus with diabetic chronic kidney disease: Secondary | ICD-10-CM | POA: Diagnosis not present

## 2022-05-22 NOTE — Telephone Encounter (Signed)
Patient's blood pressure 148/99 taken recently

## 2022-05-23 NOTE — Telephone Encounter (Signed)
Noted  

## 2022-05-24 ENCOUNTER — Telehealth: Payer: Self-pay | Admitting: Family Medicine

## 2022-05-24 DIAGNOSIS — E1122 Type 2 diabetes mellitus with diabetic chronic kidney disease: Secondary | ICD-10-CM | POA: Diagnosis not present

## 2022-05-24 DIAGNOSIS — M48061 Spinal stenosis, lumbar region without neurogenic claudication: Secondary | ICD-10-CM | POA: Diagnosis not present

## 2022-05-24 DIAGNOSIS — Z9981 Dependence on supplemental oxygen: Secondary | ICD-10-CM | POA: Diagnosis not present

## 2022-05-24 DIAGNOSIS — N1831 Chronic kidney disease, stage 3a: Secondary | ICD-10-CM | POA: Diagnosis not present

## 2022-05-24 DIAGNOSIS — M419 Scoliosis, unspecified: Secondary | ICD-10-CM | POA: Diagnosis not present

## 2022-05-24 DIAGNOSIS — I1310 Hypertensive heart and chronic kidney disease without heart failure, with stage 1 through stage 4 chronic kidney disease, or unspecified chronic kidney disease: Secondary | ICD-10-CM | POA: Diagnosis not present

## 2022-05-24 DIAGNOSIS — E785 Hyperlipidemia, unspecified: Secondary | ICD-10-CM | POA: Diagnosis not present

## 2022-05-24 DIAGNOSIS — M1990 Unspecified osteoarthritis, unspecified site: Secondary | ICD-10-CM | POA: Diagnosis not present

## 2022-05-24 DIAGNOSIS — M5459 Other low back pain: Secondary | ICD-10-CM | POA: Diagnosis not present

## 2022-05-24 NOTE — Telephone Encounter (Signed)
After review of chart pt needs in office f/u for HTN. Pt notified of above. Appt scheduled at pt earliest convenience. Pt instructed to bring BP log to OV. Pt also advised that if he has symptoms such as h/a or dizziness then he needs to schedule in office appt with any available provider ASAP. Pt verb understanding. Pt also states that is BP appears to be higher with therapy & goes down around the 5pm hr.

## 2022-05-24 NOTE — Telephone Encounter (Addendum)
Keith M. From [facility] [phone]  Called to inform MD: Pt's BP was 148/98  No symptoms, but BP does not seem to be coming down with new Rx.

## 2022-05-25 ENCOUNTER — Other Ambulatory Visit: Payer: Self-pay | Admitting: *Deleted

## 2022-05-25 ENCOUNTER — Encounter: Payer: Self-pay | Admitting: *Deleted

## 2022-05-25 DIAGNOSIS — M1990 Unspecified osteoarthritis, unspecified site: Secondary | ICD-10-CM | POA: Diagnosis not present

## 2022-05-25 DIAGNOSIS — N1831 Chronic kidney disease, stage 3a: Secondary | ICD-10-CM | POA: Diagnosis not present

## 2022-05-25 DIAGNOSIS — E1122 Type 2 diabetes mellitus with diabetic chronic kidney disease: Secondary | ICD-10-CM | POA: Diagnosis not present

## 2022-05-25 DIAGNOSIS — E785 Hyperlipidemia, unspecified: Secondary | ICD-10-CM | POA: Diagnosis not present

## 2022-05-25 DIAGNOSIS — M5459 Other low back pain: Secondary | ICD-10-CM | POA: Diagnosis not present

## 2022-05-25 DIAGNOSIS — M419 Scoliosis, unspecified: Secondary | ICD-10-CM | POA: Diagnosis not present

## 2022-05-25 DIAGNOSIS — Z9981 Dependence on supplemental oxygen: Secondary | ICD-10-CM | POA: Diagnosis not present

## 2022-05-25 DIAGNOSIS — I1310 Hypertensive heart and chronic kidney disease without heart failure, with stage 1 through stage 4 chronic kidney disease, or unspecified chronic kidney disease: Secondary | ICD-10-CM | POA: Diagnosis not present

## 2022-05-25 DIAGNOSIS — M48061 Spinal stenosis, lumbar region without neurogenic claudication: Secondary | ICD-10-CM | POA: Diagnosis not present

## 2022-05-25 NOTE — Patient Instructions (Signed)
Visit Information  Thank you for taking time to visit with me today. Please don't hesitate to contact me if I can be of assistance to you.   Following are the goals we discussed today:   Goals Addressed               This Visit's Progress     no needs (pt-stated)        Care Coordination Interventions: Provided education to patient and/or caregiver about advanced directives Reviewed medications with patient and discussed purpose of all medications Reviewed scheduled/upcoming provider appointments including all pending appointments and verified AWV completed on 01/09/2022. Screening for signs and symptoms of depression related to chronic disease state  Assessed social determinant of health barriers Pt declined care management services related to his HTN indicating he is working with Dr. [name] with good result in regulating his blood pressures. Pt continue to change is dietary habits with great results with his ongoing weight lost.           Please call the care guide team at [phone] if you need to cancel or reschedule your appointment.   If you are experiencing a Mental Health or [place] or need someone to talk to, please call the Suicide and Crisis Lifeline: 988 call the [place] National Suicide Prevention Lifeline: 667-221-0597 or TTY: 4143768048 TTY (514)439-5571) to talk to a trained counselor call 1-800-273-TALK (toll free, 24 hour hotline) go to [facility] [address] [place] [phone])  Patient verbalizes understanding of instructions and care plan provided today and agrees to view in [place]. Active MyChart status and patient understanding of how to access instructions and care plan via MyChart confirmed with patient.     No further follow up required: No needs presented today   Raina Mina, RN Care Management Coordinator [place] Office [phone]

## 2022-05-25 NOTE — Patient Outreach (Signed)
  Care Coordination   Initial Visit Note   05/25/2022 Name: Keith [name] MRN: [id number] DOB: [birth year]  Keith [name] is a 71 y.o. year old male who sees [name], Malka So, MD for primary care. I spoke with  Keith [name] by phone today  What matters to the patients health and wellness today?  No needs    Goals Addressed               This Visit's Progress     no needs (pt-stated)        Care Coordination Interventions: Provided education to patient and/or caregiver about advanced directives Reviewed medications with patient and discussed purpose of all medications Reviewed scheduled/upcoming provider appointments including all pending appointments and verified AWV completed on 01/09/2022. Screening for signs and symptoms of depression related to chronic disease state  Assessed social determinant of health barriers Pt declined care management services related to his HTN indicating he is working with Dr. [name] with good result in regulating his blood pressures. Pt continue to change is dietary habits with great results with his ongoing weight lost.            SDOH assessments and interventions completed:  Yes  SDOH Interventions Today    Flowsheet Row Most Recent Value  SDOH Interventions   Food Insecurity Interventions Intervention Not Indicated  Housing Interventions Intervention Not Indicated  Transportation Interventions Intervention Not Indicated        Care Coordination Interventions Activated:  Yes  Care Coordination Interventions:  Yes, provided   Follow up plan: No further intervention required.   Encounter Outcome:  Pt. Visit Completed   Raina Mina, RN Care Management Coordinator [place] Office [phone]

## 2022-05-28 ENCOUNTER — Telehealth: Payer: Self-pay | Admitting: Family Medicine

## 2022-05-28 DIAGNOSIS — M1990 Unspecified osteoarthritis, unspecified site: Secondary | ICD-10-CM | POA: Diagnosis not present

## 2022-05-28 DIAGNOSIS — I1310 Hypertensive heart and chronic kidney disease without heart failure, with stage 1 through stage 4 chronic kidney disease, or unspecified chronic kidney disease: Secondary | ICD-10-CM | POA: Diagnosis not present

## 2022-05-28 DIAGNOSIS — M5459 Other low back pain: Secondary | ICD-10-CM | POA: Diagnosis not present

## 2022-05-28 DIAGNOSIS — E1122 Type 2 diabetes mellitus with diabetic chronic kidney disease: Secondary | ICD-10-CM | POA: Diagnosis not present

## 2022-05-28 DIAGNOSIS — M419 Scoliosis, unspecified: Secondary | ICD-10-CM | POA: Diagnosis not present

## 2022-05-28 DIAGNOSIS — N1831 Chronic kidney disease, stage 3a: Secondary | ICD-10-CM | POA: Diagnosis not present

## 2022-05-28 DIAGNOSIS — M48061 Spinal stenosis, lumbar region without neurogenic claudication: Secondary | ICD-10-CM | POA: Diagnosis not present

## 2022-05-28 DIAGNOSIS — Z9981 Dependence on supplemental oxygen: Secondary | ICD-10-CM | POA: Diagnosis not present

## 2022-05-28 DIAGNOSIS — E785 Hyperlipidemia, unspecified: Secondary | ICD-10-CM | POA: Diagnosis not present

## 2022-05-28 NOTE — Telephone Encounter (Signed)
[name] from [facility] call and stated she need a verbal order to extent PT 1 WK 1 ,2 WK'S 2 and 1 wk 1 also stated pt bp is 160/100 and she think it come from stress of having pain in his legs [name] stated pt is sleeping only 3 hr's a night and last week pt slept 4 hr's a night for 2 night's .Keith [name] 's # is [phone].

## 2022-05-29 NOTE — Telephone Encounter (Signed)
I left a voicemail for Keith [name] to return my call.

## 2022-05-31 ENCOUNTER — Other Ambulatory Visit: Payer: Self-pay | Admitting: Physical Medicine and Rehabilitation

## 2022-06-01 ENCOUNTER — Encounter: Payer: Self-pay | Admitting: *Deleted

## 2022-06-01 ENCOUNTER — Telehealth: Payer: Self-pay | Admitting: Family Medicine

## 2022-06-01 DIAGNOSIS — M419 Scoliosis, unspecified: Secondary | ICD-10-CM | POA: Diagnosis not present

## 2022-06-01 DIAGNOSIS — N1831 Chronic kidney disease, stage 3a: Secondary | ICD-10-CM | POA: Diagnosis not present

## 2022-06-01 DIAGNOSIS — E785 Hyperlipidemia, unspecified: Secondary | ICD-10-CM | POA: Diagnosis not present

## 2022-06-01 DIAGNOSIS — M1990 Unspecified osteoarthritis, unspecified site: Secondary | ICD-10-CM | POA: Diagnosis not present

## 2022-06-01 DIAGNOSIS — M48061 Spinal stenosis, lumbar region without neurogenic claudication: Secondary | ICD-10-CM | POA: Diagnosis not present

## 2022-06-01 DIAGNOSIS — I1310 Hypertensive heart and chronic kidney disease without heart failure, with stage 1 through stage 4 chronic kidney disease, or unspecified chronic kidney disease: Secondary | ICD-10-CM | POA: Diagnosis not present

## 2022-06-01 DIAGNOSIS — Z9981 Dependence on supplemental oxygen: Secondary | ICD-10-CM | POA: Diagnosis not present

## 2022-06-01 DIAGNOSIS — M5459 Other low back pain: Secondary | ICD-10-CM | POA: Diagnosis not present

## 2022-06-01 DIAGNOSIS — E1122 Type 2 diabetes mellitus with diabetic chronic kidney disease: Secondary | ICD-10-CM | POA: Diagnosis not present

## 2022-06-01 NOTE — Telephone Encounter (Signed)
Opened in error

## 2022-06-01 NOTE — Telephone Encounter (Signed)
I left Keith [name] a voicemail to return my call.

## 2022-06-01 NOTE — Telephone Encounter (Signed)
I spoke with patient. He needed a refill on his gabapentin, the prescribing provider has already sent in his refill. Nothing further needed.

## 2022-06-01 NOTE — Telephone Encounter (Signed)
Pt called, returning CMA's call. CMA was unavailable. Pt asked that CMA call back at their earliest convenience. 

## 2022-06-02 ENCOUNTER — Other Ambulatory Visit: Payer: Self-pay | Admitting: Family Medicine

## 2022-06-02 DIAGNOSIS — I10 Essential (primary) hypertension: Secondary | ICD-10-CM

## 2022-06-04 DIAGNOSIS — Z9181 History of falling: Secondary | ICD-10-CM | POA: Diagnosis not present

## 2022-06-04 DIAGNOSIS — M25571 Pain in right ankle and joints of right foot: Secondary | ICD-10-CM | POA: Diagnosis not present

## 2022-06-04 DIAGNOSIS — I10 Essential (primary) hypertension: Secondary | ICD-10-CM | POA: Diagnosis not present

## 2022-06-04 DIAGNOSIS — R7303 Prediabetes: Secondary | ICD-10-CM | POA: Diagnosis not present

## 2022-06-04 DIAGNOSIS — Z79899 Other long term (current) drug therapy: Secondary | ICD-10-CM | POA: Diagnosis not present

## 2022-06-04 DIAGNOSIS — M549 Dorsalgia, unspecified: Secondary | ICD-10-CM | POA: Diagnosis not present

## 2022-06-04 DIAGNOSIS — G8929 Other chronic pain: Secondary | ICD-10-CM | POA: Diagnosis not present

## 2022-06-04 NOTE — Telephone Encounter (Signed)
Patient has an appointment 8/30 to address blood pressure & pain. Will close encounter; orders have been signed & faxed back.

## 2022-06-05 DIAGNOSIS — M48061 Spinal stenosis, lumbar region without neurogenic claudication: Secondary | ICD-10-CM | POA: Diagnosis not present

## 2022-06-05 DIAGNOSIS — E1122 Type 2 diabetes mellitus with diabetic chronic kidney disease: Secondary | ICD-10-CM | POA: Diagnosis not present

## 2022-06-05 DIAGNOSIS — Z9981 Dependence on supplemental oxygen: Secondary | ICD-10-CM | POA: Diagnosis not present

## 2022-06-05 DIAGNOSIS — E785 Hyperlipidemia, unspecified: Secondary | ICD-10-CM | POA: Diagnosis not present

## 2022-06-05 DIAGNOSIS — N1831 Chronic kidney disease, stage 3a: Secondary | ICD-10-CM | POA: Diagnosis not present

## 2022-06-05 DIAGNOSIS — I1310 Hypertensive heart and chronic kidney disease without heart failure, with stage 1 through stage 4 chronic kidney disease, or unspecified chronic kidney disease: Secondary | ICD-10-CM | POA: Diagnosis not present

## 2022-06-05 DIAGNOSIS — M5459 Other low back pain: Secondary | ICD-10-CM | POA: Diagnosis not present

## 2022-06-05 DIAGNOSIS — M419 Scoliosis, unspecified: Secondary | ICD-10-CM | POA: Diagnosis not present

## 2022-06-05 DIAGNOSIS — M1990 Unspecified osteoarthritis, unspecified site: Secondary | ICD-10-CM | POA: Diagnosis not present

## 2022-06-05 NOTE — Progress Notes (Signed)
 [name] is a 70 y.o.male, who is here today with his wife for follow-up.  Keith [name] was last seen on 04/02/2022 for acute visit.  Hypertension:  Medications: Carvedilol 6.25 mg twice daily and amlodipine 5 mg daily. BP readings at home: Usually elevated in the morning, 150s/80s.  It seems to improve after 5 PM, 120s-130s/70s. Side effects: None Negative for unusual or severe headache, visual changes, exertional chest pain, dyspnea,  focal weakness, or worsening edema. CKD 3: Keith [name] has not noted gross hematuria, foam in urine, or decreased urine output.  Lab Results  Component Value Date   CREATININE 1.69 (H) 04/23/2022   BUN 19 04/23/2022   NA 142 04/23/2022   K 4.1 04/23/2022   CL 109 04/23/2022   CO2 24 04/23/2022   Today Keith [name] is complaining about lower extremity numbness and tingling, intermittently throughout the day but "extremely worse" and night. Right lower extremity stiffness, muscle spasm, and an urge to move leg throughout the night.  Problem is interfering with his sleep. History of peripheral neuropathy, currently Keith [name] is on gabapentin 300 mg twice daily. Keith [name] is also on duloxetine 60 mg daily. Follows with pain management for lower back pain/spinal stenosis.  Iron def anemia: Keith [name] is not on iron supplementation. Keith [name] has not noted blood in stool or melena.  Lab Results  Component Value Date   WBC 8.0 04/02/2022   HGB 9.7 (L) 04/02/2022   HCT 29.8 (L) 04/02/2022   MCV 96.2 04/02/2022   PLT 368.0 04/02/2022   Keith [name] feels like PT is helping a lot, Keith [name] is more mobile, using a walker at home.  In general Keith [name] is feeling better. Keith [name] has lost some weight,last wt at home 278 Lb.  Keith [name] feels  like Keith [name] is breathing better, O2 sats at home 98 to 99% on 2 L/min of O2 supplementation.   Keith [name] has been off of O2 supplementation for up to 12 hours and O2 sats are around same numbers. Negative for cough or wheezing.  Review of Systems  Constitutional:  Positive for fatigue. Negative for  activity change, appetite change and fever.  HENT:  Negative for mouth sores, nosebleeds and sore throat.   Gastrointestinal:  Negative for abdominal pain, nausea and vomiting.       Negative for changes in bowel habits.  Endocrine: Negative for cold intolerance and heat intolerance.  Musculoskeletal:  Positive for arthralgias, back pain and gait problem.  Skin:  Negative for rash.  Neurological:  Negative for syncope, facial asymmetry and weakness.  Psychiatric/Behavioral:  Positive for sleep disturbance. Negative for confusion.   Rest see pertinent positives and negatives per HPI.  Current Outpatient Medications on File Prior to Visit  Medication Sig Dispense Refill   acetaminophen (TYLENOL) 325 MG tablet Take 1-2 tablets (325-650 mg total) by mouth every 4 (four) hours as needed for mild pain.     apixaban (ELIQUIS) 5 MG TABS tablet Take 1 tablet (5 mg total) by mouth 2 (two) times daily. 60 tablet 11   Blood Glucose Monitoring Suppl (ACCU-CHEK GUIDE ME) w/Device KIT USE TO TEST BLOOD SUGARS 1-2 TIMES DAILY. 1 kit 0   carvedilol (COREG) 6.25 MG tablet Take 1 tablet (6.25 mg total) by mouth 2 (two) times daily with a meal. 60 tablet 3   cephALEXin (KEFLEX) 500 MG capsule Take 1 capsule (500 mg total) by mouth 4 (four) times daily. 28 capsule 0   DULoxetine (CYMBALTA) 60 MG capsule TAKE 1 CAPSULE BY MOUTH EVERY DAY   90 capsule 2   gabapentin (NEURONTIN) 300 MG capsule Take 1 capsule (300 mg total) by mouth 2 (two) times daily. 60 capsule 3   HYDROcodone-acetaminophen (NORCO/VICODIN) 5-325 MG tablet Take 1 tablet by mouth every 6 (six) hours as needed for severe pain. 8 tablet 0   hydrocortisone (ANUSOL-HC) 2.5 % rectal cream Place 1 application. rectally as needed for hemorrhoids or anal itching.     levETIRAcetam (KEPPRA) 250 MG tablet TAKE 1 TABLET BY MOUTH TWICE A DAY 180 tablet 1   Multiple Vitamin (MULITIVITAMIN WITH MINERALS) TABS Take 1 tablet by mouth daily with breakfast.      naloxone (NARCAN) nasal spray 4 mg/0.1 mL 1 spray as needed (accidental overdose).     oxyCODONE-acetaminophen (PERCOCET) 7.5-325 MG tablet Take 2 tablets by mouth every 6 (six) hours as needed for severe pain. Note that 120 pills were filled on 10/31--day prior to hospital admission. Taking this four times a day should be equal to using 10 mg three times a day as in the hospital. Can wean down as able. 30 tablet 0   polyethylene glycol (MIRALAX / GLYCOLAX) 17 g packet Take 17 g by mouth daily as needed for mild constipation.     Potassium 99 MG TABS Take 99 mg by mouth daily.     rosuvastatin (CRESTOR) 20 MG tablet Take 1 tablet (20 mg total) by mouth daily. 90 tablet 3   torsemide (DEMADEX) 20 MG tablet Take 1 tablet (20 mg total) by mouth every Monday, Wednesday, and Friday. 20 tablet 3   No current facility-administered medications on file prior to visit.   Past Medical History:  Diagnosis Date   Allergy    Chronic back pain    Diabetes mellitus without complication (HCC)    DNR (do not resuscitate) 11/19/2020   Duodenal ulcer hemorrhage 08/29/2014   ED (erectile dysfunction)    Esophageal stricture 08/30/2014   Glucose intolerance (impaired glucose tolerance)    Hiatal hernia 08/30/2014   Hypertension    Hypokalemia 04/11/2013   MRSA carrier 08/30/2014   Obesity    Osteoarthritis    Pneumonia    Pulmonary embolism (HCC) 06/2020   Scoliosis 2016   Spinal stenosis of lumbar region    Urinary tract infection 04/11/2013   Allergies  Allergen Reactions   Aspirin Other (See Comments)    Irritates the stomach and the patient developed ulcers, also   Lisinopril Other (See Comments)    Caused a body ache   Social History   Socioeconomic History   Marital status: Married    Spouse name: Not on file   Number of children: Not on file   Years of education: Not on file   Highest education level: Not on file  Occupational History   Occupation: retired  Tobacco Use   Smoking  status: Former    Packs/day: 1.00    Years: 25.00    Total pack years: 25.00    Types: Cigarettes    Quit date: 1995    Years since quitting: 28.6   Smokeless tobacco: Never  Vaping Use   Vaping Use: Never used  Substance and Sexual Activity   Alcohol use: Not Currently    Comment: holidays and special occasions   Drug use: Yes    Types: Oxycodone   Sexual activity: Yes    Birth control/protection: None  Other Topics Concern   Not on file  Social History Narrative   Married.     Social Determinants of Health     Financial Resource Strain: Low Risk  (03/06/2022)   Overall Financial Resource Strain (CARDIA)    Difficulty of Paying Living Expenses: Not hard at all  Food Insecurity: No Food Insecurity (05/25/2022)   Hunger Vital Sign    Worried About Running Out of Food in the Last Year: Never true    Ran Out of Food in the Last Year: Never true  Transportation Needs: No Transportation Needs (05/25/2022)   PRAPARE - Hydrologist (Medical): No    Lack of Transportation (Non-Medical): No  Physical Activity: Sufficiently Active (01/09/2022)   Exercise Vital Sign    Days of Exercise per Week: 7 days    Minutes of Exercise per Session: 30 min  Stress: No Stress Concern Present (01/09/2022)   [place]    Feeling of Stress : Not at all  Social Connections: Moderately Integrated (01/03/2021)   Social Connection and Isolation Panel [NHANES]    Frequency of Communication with Friends and Family: Three times a week    Frequency of Social Gatherings with Friends and Family: Three times a week    Attends Religious Services: More than 4 times per year    Active Member of Clubs or Organizations: No    Attends Archivist Meetings: Never    Marital Status: Married   Vitals:   06/06/22 1056  BP: (!) 142/100  Pulse: 88  Resp: 16  SpO2: 97%   Wt Readings from Last 3 Encounters:  06/06/22  278 lb (126.1 kg)  04/13/22 288 lb 6.4 oz (130.8 kg)  03/27/22 296 lb (134.3 kg)  Body mass index is 37.7 kg/m. Physical Exam Nursing note reviewed.  Constitutional:      General: Keith [name] is not in acute distress.    Appearance: Keith [name] is well-developed.  HENT:     Head: Normocephalic and atraumatic.  Eyes:     Conjunctiva/sclera: Conjunctivae normal.  Cardiovascular:     Rate and Rhythm: Normal rate and regular rhythm.     Heart sounds: No murmur heard.    Comments: DP pulses palpable. Pulmonary:     Effort: Pulmonary effort is normal. No respiratory distress.     Breath sounds: Normal breath sounds.  Abdominal:     Palpations: Abdomen is soft.     Tenderness: There is no abdominal tenderness.  Musculoskeletal:     Right lower leg: 1+ Pitting Edema present.     Left lower leg: 1+ Pitting Edema present.  Lymphadenopathy:     Cervical: No cervical adenopathy.  Skin:    General: Skin is warm.     Findings: No erythema or rash.  Neurological:     Mental Status: Keith [name] is alert and oriented to person, place, and time.     Cranial Nerves: No cranial nerve deficit.     Comments: In a wheel chair today.  Psychiatric:     Comments: Well groomed, good eye contact.   ASSESSMENT AND PLAN:   Keith [name] was seen today for hypertension.  Diagnoses and all orders for this visit:  Nocturnal leg movements ? RLS. Gabapentin does not seem to be helping with this problem. Keith [name] agrees with trying Requip. Monitor for new symptoms.  -     rOPINIRole (REQUIP) 1 MG tablet; Take 0.5-1 tablets (0.5-1 mg total) by mouth at bedtime.  Iron deficiency anemia, unspecified iron deficiency anemia type H/H went from 11.4/35.7 to 9.3/28.8 in 03/2022, slightly improved in 04/2022 (9.7/29.8). Fe Sulfate 325  mg daily started today. Colonoscopy in 01/2018.  -     ferrous sulfate 325 (65 FE) MG tablet; Take 1 tablet (325 mg total) by mouth daily with breakfast.  Diabetic peripheral neuropathy ([place]) Problem does not  seem to be well controlled. We discussed differential diagnosis, radiculopathy is to be considered given his history of lumbar spinal stenosis. Continue gabapentin 300 mg twice daily. This problem is being managed by his pain clinic provider. Continue appropriate skin care and trauma avoidance.   CKD (chronic kidney disease), stage III ([place]) Problem has been otherwise stable. Stressed the importance of adequate glucose and BP control. Continue adequate hydration, low-salt diet, and avoidance of NSAIDs.  Chronic respiratory failure with hypoxia (HCC) O2 sats have improved, so recommend continuing O2 supplementation when in bed and as needed throughout the day. O2 sats goal >= 89. Instructed about warning signs.   Essential hypertension BP is not well controlled. Possible complications of elevated BP discussed. Changes today: Amlodipine increased from 5 mg to 10 mg.  Continue carvedilol 6.25 mg twice daily. Low salt diet. Continue monitoring BP at home. Instructed about warning signs. Keith [name] has an appointment with his cardiologist in about 2 weeks. Follow-up in 2 months.  I spent a total of 43 minutes in both face to face and non face to face activities for this visit on the date of this encounter. During this time history was obtained and documented, examination was performed, prior labs reviewed, and assessment/plan discussed.  Return in about 2 months (around 08/06/2022).  Baljit Liebert G. [name], MD  [facility] [place] office.

## 2022-06-06 ENCOUNTER — Ambulatory Visit (INDEPENDENT_AMBULATORY_CARE_PROVIDER_SITE_OTHER): Payer: Medicare Other | Admitting: Family Medicine

## 2022-06-06 ENCOUNTER — Encounter: Payer: Self-pay | Admitting: Family Medicine

## 2022-06-06 VITALS — BP 142/100 | HR 88 | Resp 16 | Ht 72.0 in | Wt 278.0 lb

## 2022-06-06 DIAGNOSIS — R258 Other abnormal involuntary movements: Secondary | ICD-10-CM

## 2022-06-06 DIAGNOSIS — J9611 Chronic respiratory failure with hypoxia: Secondary | ICD-10-CM | POA: Diagnosis not present

## 2022-06-06 DIAGNOSIS — D509 Iron deficiency anemia, unspecified: Secondary | ICD-10-CM | POA: Diagnosis not present

## 2022-06-06 DIAGNOSIS — I10 Essential (primary) hypertension: Secondary | ICD-10-CM | POA: Diagnosis not present

## 2022-06-06 DIAGNOSIS — E1142 Type 2 diabetes mellitus with diabetic polyneuropathy: Secondary | ICD-10-CM

## 2022-06-06 DIAGNOSIS — N1831 Chronic kidney disease, stage 3a: Secondary | ICD-10-CM

## 2022-06-06 DIAGNOSIS — R079 Chest pain, unspecified: Secondary | ICD-10-CM | POA: Diagnosis not present

## 2022-06-06 DIAGNOSIS — G629 Polyneuropathy, unspecified: Secondary | ICD-10-CM

## 2022-06-06 DIAGNOSIS — R748 Abnormal levels of other serum enzymes: Secondary | ICD-10-CM | POA: Diagnosis not present

## 2022-06-06 DIAGNOSIS — R29898 Other symptoms and signs involving the musculoskeletal system: Secondary | ICD-10-CM | POA: Diagnosis not present

## 2022-06-06 MED ORDER — FERROUS SULFATE 325 (65 FE) MG PO TABS: 325.0000 mg | ORAL_TABLET | Freq: Every day | ORAL | 3 refills | Status: DC

## 2022-06-06 MED ORDER — AMLODIPINE BESYLATE 10 MG PO TABS: 10.0000 mg | ORAL_TABLET | Freq: Every day | ORAL | 1 refills | Status: DC

## 2022-06-06 MED ORDER — ROPINIROLE HCL 1 MG PO TABS: 0.5000 mg | ORAL_TABLET | Freq: Every day | ORAL | 1 refills | Status: DC

## 2022-06-06 NOTE — Assessment & Plan Note (Signed)
O2 sats have improved, so recommend continuing O2 supplementation when in bed and as needed throughout the day. O2 sats goal >= 89. Instructed about warning signs.

## 2022-06-06 NOTE — Assessment & Plan Note (Addendum)
Problem has been otherwise stable. Stressed the importance of adequate glucose and BP control. Continue adequate hydration, low-salt diet, and avoidance of NSAIDs.

## 2022-06-06 NOTE — Patient Instructions (Addendum)
A few things to remember from today's visit:  Nocturnal leg movements  Peripheral polyneuropathy  Essential hypertension - Plan: amLODipine (NORVASC) 10 MG tablet  Stage 3a chronic kidney disease (HCC)  Iron deficiency anemia, unspecified iron deficiency anemia type  If you need refills please call your pharmacy. Do not use My Chart to request refills or for acute issues that need immediate attention. Continue    Please be sure medication list is accurate. If a new problem present, please set up appointment sooner than planned today.  Ferrous sulfate 325 mg daily or eery other day with vit C. Requip to try for leg movement at night. Amlodipine dose increased to 10 mg and to take in the morning. Discuss neuropathic pain with pain management.  Labs next visit.   Restless Legs Syndrome Restless legs syndrome is a condition that causes uncomfortable feelings or sensations in the legs, especially while sitting or lying down. The sensations usually cause an overwhelming urge to move the legs. The arms can also sometimes be affected. The condition can range from mild to severe. The symptoms often interfere with a person's ability to sleep. What are the causes? The cause of this condition is not known. What increases the risk? The following factors may make you more likely to develop this condition: Being older than 50. Pregnancy. Being a woman. In general, the condition is more common in women than in men. A family history of the condition. Having iron deficiency. Overuse of caffeine, nicotine, or alcohol. Certain medical conditions, such as kidney disease, Parkinson's disease, or nerve damage. Certain medicines, such as those for high blood pressure, nausea, colds, allergies, depression, and some heart conditions. What are the signs or symptoms? The main symptom of this condition is uncomfortable sensations in the legs, such  as: Pulling. Tingling. Prickling. Throbbing. Crawling. Burning. Usually, the sensations: Affect both sides of the body. Are worse when you sit or lie down. Are worse at night. These may make it difficult to fall asleep. Make you have a strong urge to move your legs. Are temporarily relieved by moving your legs or standing. The arms can also be affected, but this is rare. People who have this condition often have tiredness during the day because of their lack of sleep at night. How is this diagnosed? This condition may be diagnosed based on: Your symptoms. Blood tests. In some cases, you may be monitored in a sleep lab by a specialist (a sleep study). This can detect any disruptions in your sleep. How is this treated? This condition is treated by managing the symptoms. This may include: Lifestyle changes, such as exercising, using relaxation techniques, and avoiding caffeine, alcohol, or tobacco. Iron supplements. Medicines. Parkinson's medications may be tried first. Anti-seizure medications can also be helpful. Follow these instructions at home: General instructions Take over-the-counter and prescription medicines only as told by your health care provider. Use methods to help relieve the uncomfortable sensations, such as: Massaging your legs. Walking or stretching. Taking a cold or hot bath. Keep all follow-up visits. This is important. Lifestyle     Practice good sleep habits. For example, go to bed and get up at the same time every day. Most adults should get 7-9 hours of sleep each night. Exercise regularly. Try to get at least 30 minutes of exercise most days of the week. Practice ways of relaxing, such as yoga or meditation. Avoid caffeine and alcohol. Do not use any products that contain nicotine or tobacco. These products include cigarettes,  chewing tobacco, and vaping devices, such as e-cigarettes. If you need help quitting, ask your health care provider. Where to  find more information [employer] of Neurological Disorders and Stroke: [employer] Contact a health care provider if: Your symptoms get worse or they do not improve with treatment. Summary Restless legs syndrome is a condition that causes uncomfortable feelings or sensations in the legs, especially while sitting or lying down. The symptoms often interfere with your ability to sleep. This condition is treated by managing the symptoms. You may need to make lifestyle changes or take medicines. This information is not intended to replace advice given to you by your health care provider. Make sure you discuss any questions you have with your health care provider. Document Revised: 05/07/2021 Document Reviewed: 05/07/2021 Elsevier Patient Education  [place].

## 2022-06-06 NOTE — Assessment & Plan Note (Signed)
BP is not well controlled. Possible complications of elevated BP discussed. Changes today: Amlodipine increased from 5 mg to 10 mg.  Continue carvedilol 6.25 mg twice daily. Low salt diet. Continue monitoring BP at home. Instructed about warning signs. He has an appointment with his cardiologist in about 2 weeks. Follow-up in 2 months.

## 2022-06-06 NOTE — Assessment & Plan Note (Signed)
Problem does not seem to be well controlled. We discussed differential diagnosis, radiculopathy is to be considered given his history of lumbar spinal stenosis. Continue gabapentin 300 mg twice daily. This problem is being managed by his pain clinic provider. Continue appropriate skin care and trauma avoidance.

## 2022-06-07 DIAGNOSIS — E785 Hyperlipidemia, unspecified: Secondary | ICD-10-CM | POA: Diagnosis not present

## 2022-06-07 DIAGNOSIS — M419 Scoliosis, unspecified: Secondary | ICD-10-CM | POA: Diagnosis not present

## 2022-06-07 DIAGNOSIS — I1310 Hypertensive heart and chronic kidney disease without heart failure, with stage 1 through stage 4 chronic kidney disease, or unspecified chronic kidney disease: Secondary | ICD-10-CM | POA: Diagnosis not present

## 2022-06-07 DIAGNOSIS — M48061 Spinal stenosis, lumbar region without neurogenic claudication: Secondary | ICD-10-CM | POA: Diagnosis not present

## 2022-06-07 DIAGNOSIS — N1831 Chronic kidney disease, stage 3a: Secondary | ICD-10-CM | POA: Diagnosis not present

## 2022-06-07 DIAGNOSIS — M5459 Other low back pain: Secondary | ICD-10-CM | POA: Diagnosis not present

## 2022-06-07 DIAGNOSIS — Z9981 Dependence on supplemental oxygen: Secondary | ICD-10-CM | POA: Diagnosis not present

## 2022-06-07 DIAGNOSIS — Z79899 Other long term (current) drug therapy: Secondary | ICD-10-CM | POA: Diagnosis not present

## 2022-06-07 DIAGNOSIS — E1122 Type 2 diabetes mellitus with diabetic chronic kidney disease: Secondary | ICD-10-CM | POA: Diagnosis not present

## 2022-06-07 DIAGNOSIS — M1990 Unspecified osteoarthritis, unspecified site: Secondary | ICD-10-CM | POA: Diagnosis not present

## 2022-06-09 ENCOUNTER — Other Ambulatory Visit: Payer: Self-pay | Admitting: Physical Medicine and Rehabilitation

## 2022-06-10 ENCOUNTER — Other Ambulatory Visit (HOSPITAL_COMMUNITY): Payer: Self-pay | Admitting: Family Medicine

## 2022-06-10 ENCOUNTER — Other Ambulatory Visit: Payer: Self-pay | Admitting: Physical Medicine and Rehabilitation

## 2022-06-12 DIAGNOSIS — E1122 Type 2 diabetes mellitus with diabetic chronic kidney disease: Secondary | ICD-10-CM | POA: Diagnosis not present

## 2022-06-12 DIAGNOSIS — M419 Scoliosis, unspecified: Secondary | ICD-10-CM | POA: Diagnosis not present

## 2022-06-12 DIAGNOSIS — E785 Hyperlipidemia, unspecified: Secondary | ICD-10-CM | POA: Diagnosis not present

## 2022-06-12 DIAGNOSIS — M48061 Spinal stenosis, lumbar region without neurogenic claudication: Secondary | ICD-10-CM | POA: Diagnosis not present

## 2022-06-12 DIAGNOSIS — I1310 Hypertensive heart and chronic kidney disease without heart failure, with stage 1 through stage 4 chronic kidney disease, or unspecified chronic kidney disease: Secondary | ICD-10-CM | POA: Diagnosis not present

## 2022-06-12 DIAGNOSIS — N1831 Chronic kidney disease, stage 3a: Secondary | ICD-10-CM | POA: Diagnosis not present

## 2022-06-12 DIAGNOSIS — M1990 Unspecified osteoarthritis, unspecified site: Secondary | ICD-10-CM | POA: Diagnosis not present

## 2022-06-12 DIAGNOSIS — M5459 Other low back pain: Secondary | ICD-10-CM | POA: Diagnosis not present

## 2022-06-12 DIAGNOSIS — Z9981 Dependence on supplemental oxygen: Secondary | ICD-10-CM | POA: Diagnosis not present

## 2022-06-15 DIAGNOSIS — N1831 Chronic kidney disease, stage 3a: Secondary | ICD-10-CM | POA: Diagnosis not present

## 2022-06-15 DIAGNOSIS — E1122 Type 2 diabetes mellitus with diabetic chronic kidney disease: Secondary | ICD-10-CM | POA: Diagnosis not present

## 2022-06-15 DIAGNOSIS — M419 Scoliosis, unspecified: Secondary | ICD-10-CM | POA: Diagnosis not present

## 2022-06-15 DIAGNOSIS — Z9981 Dependence on supplemental oxygen: Secondary | ICD-10-CM | POA: Diagnosis not present

## 2022-06-15 DIAGNOSIS — E785 Hyperlipidemia, unspecified: Secondary | ICD-10-CM | POA: Diagnosis not present

## 2022-06-15 DIAGNOSIS — I1310 Hypertensive heart and chronic kidney disease without heart failure, with stage 1 through stage 4 chronic kidney disease, or unspecified chronic kidney disease: Secondary | ICD-10-CM | POA: Diagnosis not present

## 2022-06-15 DIAGNOSIS — M1990 Unspecified osteoarthritis, unspecified site: Secondary | ICD-10-CM | POA: Diagnosis not present

## 2022-06-15 DIAGNOSIS — M48061 Spinal stenosis, lumbar region without neurogenic claudication: Secondary | ICD-10-CM | POA: Diagnosis not present

## 2022-06-15 DIAGNOSIS — M5459 Other low back pain: Secondary | ICD-10-CM | POA: Diagnosis not present

## 2022-06-16 DIAGNOSIS — M4714 Other spondylosis with myelopathy, thoracic region: Secondary | ICD-10-CM | POA: Diagnosis not present

## 2022-06-19 DIAGNOSIS — M1990 Unspecified osteoarthritis, unspecified site: Secondary | ICD-10-CM | POA: Diagnosis not present

## 2022-06-19 DIAGNOSIS — Z9981 Dependence on supplemental oxygen: Secondary | ICD-10-CM | POA: Diagnosis not present

## 2022-06-19 DIAGNOSIS — N1831 Chronic kidney disease, stage 3a: Secondary | ICD-10-CM | POA: Diagnosis not present

## 2022-06-19 DIAGNOSIS — E785 Hyperlipidemia, unspecified: Secondary | ICD-10-CM | POA: Diagnosis not present

## 2022-06-19 DIAGNOSIS — M419 Scoliosis, unspecified: Secondary | ICD-10-CM | POA: Diagnosis not present

## 2022-06-19 DIAGNOSIS — I1310 Hypertensive heart and chronic kidney disease without heart failure, with stage 1 through stage 4 chronic kidney disease, or unspecified chronic kidney disease: Secondary | ICD-10-CM | POA: Diagnosis not present

## 2022-06-19 DIAGNOSIS — E1122 Type 2 diabetes mellitus with diabetic chronic kidney disease: Secondary | ICD-10-CM | POA: Diagnosis not present

## 2022-06-19 DIAGNOSIS — M48061 Spinal stenosis, lumbar region without neurogenic claudication: Secondary | ICD-10-CM | POA: Diagnosis not present

## 2022-06-19 DIAGNOSIS — M5459 Other low back pain: Secondary | ICD-10-CM | POA: Diagnosis not present

## 2022-06-25 ENCOUNTER — Telehealth: Payer: Medicare Other

## 2022-06-25 ENCOUNTER — Encounter (HOSPITAL_COMMUNITY): Payer: Self-pay | Admitting: Cardiology

## 2022-06-25 ENCOUNTER — Ambulatory Visit (HOSPITAL_BASED_OUTPATIENT_CLINIC_OR_DEPARTMENT_OTHER)
Admission: RE | Admit: 2022-06-25 | Discharge: 2022-06-25 | Disposition: A | Discharge status: Home / Self Care | Payer: Medicare Other | Source: Ambulatory Visit

## 2022-06-25 ENCOUNTER — Ambulatory Visit (HOSPITAL_COMMUNITY)
Admission: RE | Admit: 2022-06-25 | Discharge: 2022-06-25 | Disposition: A | Discharge status: Home / Self Care | Payer: Medicare Other | Source: Ambulatory Visit | Attending: Cardiology | Admitting: Cardiology

## 2022-06-25 ENCOUNTER — Other Ambulatory Visit (HOSPITAL_COMMUNITY): Payer: Self-pay

## 2022-06-25 VITALS — BP 160/84 | HR 100 | Wt 293.4 lb

## 2022-06-25 DIAGNOSIS — Z86711 Personal history of pulmonary embolism: Secondary | ICD-10-CM | POA: Diagnosis not present

## 2022-06-25 DIAGNOSIS — N183 Chronic kidney disease, stage 3 unspecified: Secondary | ICD-10-CM | POA: Insufficient documentation

## 2022-06-25 DIAGNOSIS — I272 Pulmonary hypertension, unspecified: Secondary | ICD-10-CM | POA: Diagnosis not present

## 2022-06-25 DIAGNOSIS — I5032 Chronic diastolic (congestive) heart failure: Secondary | ICD-10-CM

## 2022-06-25 DIAGNOSIS — I130 Hypertensive heart and chronic kidney disease with heart failure and stage 1 through stage 4 chronic kidney disease, or unspecified chronic kidney disease: Secondary | ICD-10-CM | POA: Diagnosis not present

## 2022-06-25 DIAGNOSIS — I5033 Acute on chronic diastolic (congestive) heart failure: Secondary | ICD-10-CM

## 2022-06-25 DIAGNOSIS — Z7901 Long term (current) use of anticoagulants: Secondary | ICD-10-CM | POA: Insufficient documentation

## 2022-06-25 DIAGNOSIS — R0683 Snoring: Secondary | ICD-10-CM | POA: Diagnosis not present

## 2022-06-25 DIAGNOSIS — Z86718 Personal history of other venous thrombosis and embolism: Secondary | ICD-10-CM | POA: Diagnosis not present

## 2022-06-25 DIAGNOSIS — Z79899 Other long term (current) drug therapy: Secondary | ICD-10-CM | POA: Diagnosis not present

## 2022-06-25 DIAGNOSIS — E1122 Type 2 diabetes mellitus with diabetic chronic kidney disease: Secondary | ICD-10-CM | POA: Insufficient documentation

## 2022-06-25 DIAGNOSIS — M4800 Spinal stenosis, site unspecified: Secondary | ICD-10-CM | POA: Insufficient documentation

## 2022-06-25 DIAGNOSIS — I071 Rheumatic tricuspid insufficiency: Secondary | ICD-10-CM | POA: Diagnosis not present

## 2022-06-25 DIAGNOSIS — I493 Ventricular premature depolarization: Secondary | ICD-10-CM | POA: Insufficient documentation

## 2022-06-25 LAB — BASIC METABOLIC PANEL
Anion gap: 6 (ref 5–15)
BUN: 13 mg/dL (ref 8–23)
CO2: 27 mmol/L (ref 22–32)
Calcium: 10.2 mg/dL (ref 8.9–10.3)
Chloride: 110 mmol/L (ref 98–111)
Creatinine, Ser: 1.14 mg/dL (ref 0.61–1.24)
GFR, Estimated: >60 mL/min (ref >60)
Glucose, Bld: 141 mg/dL — ABNORMAL HIGH (ref 70–99)
Potassium: 4.7 mmol/L (ref 3.5–5.1)
Sodium: 143 mmol/L (ref 135–145)

## 2022-06-25 LAB — ECHOCARDIOGRAM COMPLETE
Calc EF: 55.4 %
S' Lateral: 3.1 cm
Single Plane A2C EF: 55.9 %
Single Plane A4C EF: 56 %

## 2022-06-25 LAB — BRAIN NATRIURETIC PEPTIDE: B Natriuretic Peptide: 32.8 pg/mL (ref 0.0–100.0)

## 2022-06-25 MED ORDER — CARVEDILOL 12.5 MG PO TABS: 12.5000 mg | ORAL_TABLET | Freq: Two times a day (BID) | ORAL | 3 refills | Status: DC

## 2022-06-25 MED ORDER — SPIRONOLACTONE 25 MG PO TABS: 12.5000 mg | ORAL_TABLET | Freq: Every day | ORAL | 3 refills | Status: DC

## 2022-06-25 MED ORDER — TORSEMIDE 20 MG PO TABS: 20.0000 mg | ORAL_TABLET | Freq: Every day | ORAL | 3 refills | Status: DC

## 2022-06-25 NOTE — Patient Instructions (Signed)
Increase Torsemide to 20 mg daily.  Increase Carvedilol to 12.5 mg Twice daily  Start Spironolactone 12.'5mg'$  daily.  Labs done today, your results will be available in MyChart, we will contact you for abnormal readings.  You are scheduled for a Cardiac Catheterization on Friday, September 29 with Dr. Loralie Champagne.  1. Please arrive at the [facility] (Main Entrance A) at [facility]: [address] [place], [place] [address] at 8:30 AM (This time is two hours before your procedure to ensure your preparation). Free valet parking service is available.   Special note: Every effort is made to have your procedure done on time. Please understand that emergencies sometimes delay scheduled procedures.  2. Diet: Do not eat solid foods after midnight.  The patient may have clear liquids until 5am upon the day of the procedure.  3. Medication instructions in preparation for your procedure:   Contrast Allergy: No  Stop taking Eliquis (Apixiban) on Thursday, September 28.  Stop taking, Spironolactone and Torsemide  Friday, September 29,  On the morning of your procedure, take any morning medicines NOT listed above.  You may use sips of water.  5. Plan for one night stay--bring personal belongings. 6. Bring a current list of your medications and current insurance cards. 7. You MUST have a responsible person to drive you home. 8. Someone MUST be with you the first 24 hours after you arrive home or your discharge will be delayed. 9. Please wear clothes that are easy to get on and off and wear slip-on shoes.  Your physician recommends that you schedule a follow-up appointment in: 1 month.  If you have any questions or concerns before your next appointment please send [place] a message through [place] or call our office at [phone].    TO LEAVE A MESSAGE FOR THE NURSE SELECT OPTION 2, PLEASE LEAVE A MESSAGE INCLUDING: YOUR NAME DATE OF BIRTH CALL BACK NUMBER REASON FOR CALL**this is  important as we prioritize the call backs  YOU WILL RECEIVE A CALL BACK THE SAME DAY AS LONG AS YOU CALL BEFORE 4:00 PM  At the [facility], you and your health needs are our priority. As part of our continuing mission to provide you with exceptional heart care, we have created designated Provider Care Teams. These Care Teams include your primary Cardiologist (physician) and Advanced Practice Providers (APPs- Physician Assistants and Nurse Practitioners) who all work together to provide you with the care you need, when you need it.   You may see any of the following providers on your designated Care Team at your next follow up: Dr Glori Bickers Dr Loralie Champagne Dr. Roxana Hires, NP Lyda Jester, [place] [facility] [place], [place] Forestine Na, NP Audry Riles, PharmD   Please be sure to bring in all your medications bottles to every appointment.

## 2022-06-25 NOTE — Progress Notes (Signed)
ADVANCED HF CLINIC CONSULT NOTE  Primary Care: [name], [name], MD HF Cardiologist: Dr. Aundra Dubin   HPI: Keith [name] is a 71 y.o.male with history saddle PE/LLE DVT 23/53 s/p TPA complicated by hypotension requiring Levophed, multiple prior back surgeries, left adrenal adenoma, DM II, obesity, HTN.   Admitted 03/01/22 with CP. He was hypoxic requiring supplemental oxygen. V/Q scan with moderate to high probability of PE.  Lifelong DOAC recommended, started on Eliquis 5 BID (had been off eliquis since 02/22).   Echo in 5/23 showed EF 55-60%, mild LVH, interventricular septum flattened in systole and diastole consistent with RV pressure and volume overload, RV severely enlarged with severely reduced function, RVSP 75 mmH, moderate to severe TR.   RHC/LHC in 5/23 showed no CAD, elevated right and left heart filling pressures, and pulmonary venous hypertension.  He was seen by CT surgery. Felt to be high risk for pulmonary embolectomy. Course complicated by AKI, Scr peaked at 3.34 and improved to 1.60 (baseline). Started on torsemide 20 mg M, W, F at discharge. Atenolol/HTCZ stopped.   Echo was done today and reviewed, EF 55-60%, mildly decreased RV function with mild-moderate RV enlargement, unable to estimate PA systolic pressure, IVC normal.   Patient returns for followup of RV failure.  He walks very little due to spinal stenosis, not CHF.  He is primarily in the wheelchair. He walks with PT and a walker at home.  He has home oxygen but is only using it at night.  He snores and is sleepy during the day.  No dyspnea walking short distances with his walker.  No lightheadedness, BP elevated today.  No chest pain.  Denies orthopnea.   Labs (6/23): K 4.0, creatinine 1.28 Labs (7/23): K 4.1, creatinine 1.69, BNP 20  ECG (personally reviewed): NSR, LAFB, 1st degree AVB  PMH: 1. Type 2 diabetes 2. Left adrenal adenoma 3. Obesity 4. HTN 5. H/o back surgery for spinal stenosis: Has had 3 surgeries,  actually could not walk after the third surgery but has been doing PT.  6. Venous thromboembolism: Saddle PE in 9/21 with LLE DVT, this was treated with TPA.   - Recurrent PE in 5/23 after stopping DOAC.  - Echo (5/23): EF 55-60%, mild LVH, interventricular septum flattened in systole and diastole consistent with RV pressure and volume overload, RV severely enlarged with severely reduced function, RVSP 75 mmH, moderate to severe TR.  - R/LHC (5/23): RA 12, PA 48/34 (39), PCWP mean 40, V wave 42, Fick CO/CI 5.93/2.33, PVR ?< 1 WU.  - Echo (9/23): EF 55-60%, mildly decreased RV function with mild-moderate RV enlargement, unable to estimate PA systolic pressure, IVC normal.  7. CKD stage 3  Review of Systems: All systems reviewed and negative except as per HPI.    Current Outpatient Medications  Medication Sig Dispense Refill   acetaminophen (TYLENOL) 325 MG tablet Take 1-2 tablets (325-650 mg total) by mouth every 4 (four) hours as needed for mild pain.     amLODipine (NORVASC) 10 MG tablet Take 1 tablet (10 mg total) by mouth daily. 90 tablet 1   apixaban (ELIQUIS) 5 MG TABS tablet Take 1 tablet (5 mg total) by mouth 2 (two) times daily. 60 tablet 11   Blood Glucose Monitoring Suppl (ACCU-CHEK GUIDE ME) w/Device KIT USE TO TEST BLOOD SUGARS 1-2 TIMES DAILY. 1 kit 0   DULoxetine (CYMBALTA) 60 MG capsule TAKE 1 CAPSULE BY MOUTH EVERY DAY 90 capsule 2   ferrous sulfate 325 (65  FE) MG tablet Take 1 tablet (325 mg total) by mouth daily with breakfast. 30 tablet 3   gabapentin (NEURONTIN) 300 MG capsule Take 1 capsule (300 mg total) by mouth 2 (two) times daily. 60 capsule 3   HYDROcodone-acetaminophen (NORCO) 10-325 MG tablet Take 1 tablet by mouth 3 (three) times daily as needed.     HYDROcodone-acetaminophen (NORCO/VICODIN) 5-325 MG tablet Take 1 tablet by mouth every 6 (six) hours as needed for severe pain. 8 tablet 0   hydrocortisone (ANUSOL-HC) 2.5 % rectal cream Place 1 application. rectally as  needed for hemorrhoids or anal itching.     levETIRAcetam (KEPPRA) 250 MG tablet TAKE 1 TABLET BY MOUTH TWICE A DAY 180 tablet 1   naloxone (NARCAN) nasal spray 4 mg/0.1 mL 1 spray as needed (accidental overdose).     polyethylene glycol (MIRALAX / GLYCOLAX) 17 g packet Take 17 g by mouth daily as needed for mild constipation.     rOPINIRole (REQUIP) 1 MG tablet Take 0.5-1 tablets (0.5-1 mg total) by mouth at bedtime. 30 tablet 1   rosuvastatin (CRESTOR) 20 MG tablet Take 1 tablet (20 mg total) by mouth daily. 90 tablet 3   spironolactone (ALDACTONE) 25 MG tablet Take 0.5 tablets (12.5 mg total) by mouth daily. 45 tablet 3   traZODone (DESYREL) 50 MG tablet TAKE 1/2 TO 1 TABLET BY MOUTH AT BEDTIME AS NEEDED FOR SLEEP 90 tablet 1   carvedilol (COREG) 12.5 MG tablet Take 1 tablet (12.5 mg total) by mouth 2 (two) times daily with a meal. 180 tablet 3   torsemide (DEMADEX) 20 MG tablet Take 1 tablet (20 mg total) by mouth daily. 90 tablet 3   No current facility-administered medications for this encounter.   Allergies  Allergen Reactions   Aspirin Other (See Comments)    Irritates the stomach and the patient developed ulcers, also   Lisinopril Other (See Comments)    Caused a body ache   Social History   Socioeconomic History   Marital status: Married    Spouse name: Not on file   Number of children: Not on file   Years of education: Not on file   Highest education level: Not on file  Occupational History   Occupation: retired  Tobacco Use   Smoking status: Former    Packs/day: 1.00    Years: 25.00    Total pack years: 25.00    Types: Cigarettes    Quit date: 1995    Years since quitting: 28.7   Smokeless tobacco: Never  Vaping Use   Vaping Use: Never used  Substance and Sexual Activity   Alcohol use: Not Currently    Comment: holidays and special occasions   Drug use: Yes    Types: Oxycodone   Sexual activity: Yes    Birth control/protection: None  Other Topics Concern    Not on file  Social History Narrative   Married.     Social Determinants of Health   Financial Resource Strain: Low Risk  (03/06/2022)   Overall Financial Resource Strain (CARDIA)    Difficulty of Paying Living Expenses: Not hard at all  Food Insecurity: No Food Insecurity (05/25/2022)   Hunger Vital Sign    Worried About Running Out of Food in the Last Year: Never true    Ran Out of Food in the Last Year: Never true  Transportation Needs: No Transportation Needs (05/25/2022)   PRAPARE - Hydrologist (Medical): No    Lack  of Transportation (Non-Medical): No  Physical Activity: Sufficiently Active (01/09/2022)   Exercise Vital Sign    Days of Exercise per Week: 7 days    Minutes of Exercise per Session: 30 min  Stress: No Stress Concern Present (01/09/2022)   [place]    Feeling of Stress : Not at all  Social Connections: Moderately Integrated (01/03/2021)   Social Connection and Isolation Panel [NHANES]    Frequency of Communication with Friends and Family: Three times a week    Frequency of Social Gatherings with Friends and Family: Three times a week    Attends Religious Services: More than 4 times per year    Active Member of Clubs or Organizations: No    Attends Archivist Meetings: Never    Marital Status: Married  Human resources officer Violence: Not At Risk (05/25/2022)   Humiliation, Afraid, Rape, and Kick questionnaire    Fear of Current or Ex-Partner: No    Emotionally Abused: No    Physically Abused: No    Sexually Abused: No   Family History  Problem Relation Age of Onset   Diabetes Mother    Asthma Mother    Hypertension Father    Stroke Father    Colon cancer Neg Hx    BP (!) 160/84   Pulse 100   Wt 133.1 kg (293 lb 6.4 oz)   SpO2 97%   BMI 39.79 kg/m   Wt Readings from Last 3 Encounters:  06/25/22 133.1 kg (293 lb 6.4 oz)  06/06/22 126.1 kg (278 lb)   04/13/22 130.8 kg (288 lb 6.4 oz)   PHYSICAL EXAM: General: NAD Neck: JVP 8-9 cm, no thyromegaly or thyroid nodule.  Lungs: Clear to auscultation bilaterally with normal respiratory effort. CV: Nondisplaced PMI.  Heart regular S1/S2, no S3/S4, no murmur.  1+ edema to knees.  No carotid bruit.  Normal pedal pulses.  Abdomen: Soft, nontender, no hepatosplenomegaly, no distention.  Skin: Intact without lesions or rashes.  Neurologic: Alert and oriented x 3.  Psych: Normal affect. Extremities: No clubbing or cyanosis.  HEENT: Normal.   ASSESSMENT & PLAN: 1.  Chronic diastolic CHF with RV failure:  Suspect RV strain from prior PEs.  Echo in 5/23 showed EF 55-60%, RV severely reduced, RVSP 75 mmHg, moderate to severe TR.  RHC in 5/23 showed elevated right and left heart filling pressures and pulmonary venous hypertension. Echo today showed EF 55-60%, mildly decreased RV function with mild-moderate RV enlargement, unable to estimate PA systolic pressure, IVC normal.  On exam today, he looks mildly volume overloaded.  Functional status is complicated by poor mobility due to spinal stenosis. Oxygen saturation is ok off oxygen today.  - I think he needs RHC to reassess filling pressures PA pressure, ?CTEPH though prior RHC had a somewhat surprisingly high PCWP (?true PCWP). We discussed risks/benefits and will plan study next week.  - Increase torsemide to 20 mg daily with BMET/BNP today and BMET in 10 days.  - Hold off on SGLT2i with recent UTI 2. Pulmonary hypertension: Somewhat confusing picture on 5/23 RHC with pulmonary venous hypertension in setting of very high PCWP (?accurate).   - Repeat RHC as above.  3. Tricuspid regurgitation: Moderate to severe on echo 05/23. Minimal on echo done today.   4. History PE/DVT: PE/DVT 09/21 s/p TPA, required pressor support with NE. Recurrent PE in 5/23 off Eliquis.  - Will need lifelong anticoagulation, on Eliquis 5 bid. No bleeding issues.  5. CKD stage 3:  BMET today.  6. HTN: BP elevated.  - Continue amlodipine 10 mg daily.  - Increase Coreg to 12.5 mg bid and add spironolactone 12.5 daily today.  7. Spinal stenosis: Limited mobility at baseline.   Loralie Champagne 06/25/2022

## 2022-06-25 NOTE — H&P (View-Only) (Signed)
ADVANCED HF CLINIC CONSULT NOTE  Primary Care: [name], [name], MD HF Cardiologist: Dr. Aundra Dubin   HPI: Keith [name] is a 71 y.o.male with history saddle PE/LLE DVT 52/84 s/p TPA complicated by hypotension requiring Levophed, multiple prior back surgeries, left adrenal adenoma, DM II, obesity, HTN.   Admitted 03/01/22 with CP. He was hypoxic requiring supplemental oxygen. V/Q scan with moderate to high probability of PE.  Lifelong DOAC recommended, started on Eliquis 5 BID (had been off eliquis since 02/22).   Echo in 5/23 showed EF 55-60%, mild LVH, interventricular septum flattened in systole and diastole consistent with RV pressure and volume overload, RV severely enlarged with severely reduced function, RVSP 75 mmH, moderate to severe TR.   RHC/LHC in 5/23 showed no CAD, elevated right and left heart filling pressures, and pulmonary venous hypertension.  He was seen by CT surgery. Felt to be high risk for pulmonary embolectomy. Course complicated by AKI, Scr peaked at 3.34 and improved to 1.60 (baseline). Started on torsemide 20 mg M, W, F at discharge. Atenolol/HTCZ stopped.   Echo was done today and reviewed, EF 55-60%, mildly decreased RV function with mild-moderate RV enlargement, unable to estimate PA systolic pressure, IVC normal.   Patient returns for followup of RV failure.  He walks very little due to spinal stenosis, not CHF.  He is primarily in the wheelchair. He walks with PT and a walker at home.  He has home oxygen but is only using it at night.  He snores and is sleepy during the day.  No dyspnea walking short distances with his walker.  No lightheadedness, BP elevated today.  No chest pain.  Denies orthopnea.   Labs (6/23): K 4.0, creatinine 1.28 Labs (7/23): K 4.1, creatinine 1.69, BNP 20  ECG (personally reviewed): NSR, LAFB, 1st degree AVB  PMH: 1. Type 2 diabetes 2. Left adrenal adenoma 3. Obesity 4. HTN 5. H/o back surgery for spinal stenosis: Has had 3 surgeries,  actually could not walk after the third surgery but has been doing PT.  6. Venous thromboembolism: Saddle PE in 9/21 with LLE DVT, this was treated with TPA.   - Recurrent PE in 5/23 after stopping DOAC.  - Echo (5/23): EF 55-60%, mild LVH, interventricular septum flattened in systole and diastole consistent with RV pressure and volume overload, RV severely enlarged with severely reduced function, RVSP 75 mmH, moderate to severe TR.  - R/LHC (5/23): RA 12, PA 48/34 (39), PCWP mean 40, V wave 42, Fick CO/CI 5.93/2.33, PVR ?< 1 WU.  - Echo (9/23): EF 55-60%, mildly decreased RV function with mild-moderate RV enlargement, unable to estimate PA systolic pressure, IVC normal.  7. CKD stage 3  Review of Systems: All systems reviewed and negative except as per HPI.    Current Outpatient Medications  Medication Sig Dispense Refill   acetaminophen (TYLENOL) 325 MG tablet Take 1-2 tablets (325-650 mg total) by mouth every 4 (four) hours as needed for mild pain.     amLODipine (NORVASC) 10 MG tablet Take 1 tablet (10 mg total) by mouth daily. 90 tablet 1   apixaban (ELIQUIS) 5 MG TABS tablet Take 1 tablet (5 mg total) by mouth 2 (two) times daily. 60 tablet 11   Blood Glucose Monitoring Suppl (ACCU-CHEK GUIDE ME) w/Device KIT USE TO TEST BLOOD SUGARS 1-2 TIMES DAILY. 1 kit 0   DULoxetine (CYMBALTA) 60 MG capsule TAKE 1 CAPSULE BY MOUTH EVERY DAY 90 capsule 2   ferrous sulfate 325 (65  FE) MG tablet Take 1 tablet (325 mg total) by mouth daily with breakfast. 30 tablet 3   gabapentin (NEURONTIN) 300 MG capsule Take 1 capsule (300 mg total) by mouth 2 (two) times daily. 60 capsule 3   HYDROcodone-acetaminophen (NORCO) 10-325 MG tablet Take 1 tablet by mouth 3 (three) times daily as needed.     HYDROcodone-acetaminophen (NORCO/VICODIN) 5-325 MG tablet Take 1 tablet by mouth every 6 (six) hours as needed for severe pain. 8 tablet 0   hydrocortisone (ANUSOL-HC) 2.5 % rectal cream Place 1 application. rectally as  needed for hemorrhoids or anal itching.     levETIRAcetam (KEPPRA) 250 MG tablet TAKE 1 TABLET BY MOUTH TWICE A DAY 180 tablet 1   naloxone (NARCAN) nasal spray 4 mg/0.1 mL 1 spray as needed (accidental overdose).     polyethylene glycol (MIRALAX / GLYCOLAX) 17 g packet Take 17 g by mouth daily as needed for mild constipation.     rOPINIRole (REQUIP) 1 MG tablet Take 0.5-1 tablets (0.5-1 mg total) by mouth at bedtime. 30 tablet 1   rosuvastatin (CRESTOR) 20 MG tablet Take 1 tablet (20 mg total) by mouth daily. 90 tablet 3   spironolactone (ALDACTONE) 25 MG tablet Take 0.5 tablets (12.5 mg total) by mouth daily. 45 tablet 3   traZODone (DESYREL) 50 MG tablet TAKE 1/2 TO 1 TABLET BY MOUTH AT BEDTIME AS NEEDED FOR SLEEP 90 tablet 1   carvedilol (COREG) 12.5 MG tablet Take 1 tablet (12.5 mg total) by mouth 2 (two) times daily with a meal. 180 tablet 3   torsemide (DEMADEX) 20 MG tablet Take 1 tablet (20 mg total) by mouth daily. 90 tablet 3   No current facility-administered medications for this encounter.   Allergies  Allergen Reactions   Aspirin Other (See Comments)    Irritates the stomach and the patient developed ulcers, also   Lisinopril Other (See Comments)    Caused a body ache   Social History   Socioeconomic History   Marital status: Married    Spouse name: Not on file   Number of children: Not on file   Years of education: Not on file   Highest education level: Not on file  Occupational History   Occupation: retired  Tobacco Use   Smoking status: Former    Packs/day: 1.00    Years: 25.00    Total pack years: 25.00    Types: Cigarettes    Quit date: 1995    Years since quitting: 28.7   Smokeless tobacco: Never  Vaping Use   Vaping Use: Never used  Substance and Sexual Activity   Alcohol use: Not Currently    Comment: holidays and special occasions   Drug use: Yes    Types: Oxycodone   Sexual activity: Yes    Birth control/protection: None  Other Topics Concern    Not on file  Social History Narrative   Married.     Social Determinants of Health   Financial Resource Strain: Low Risk  (03/06/2022)   Overall Financial Resource Strain (CARDIA)    Difficulty of Paying Living Expenses: Not hard at all  Food Insecurity: No Food Insecurity (05/25/2022)   Hunger Vital Sign    Worried About Running Out of Food in the Last Year: Never true    Ran Out of Food in the Last Year: Never true  Transportation Needs: No Transportation Needs (05/25/2022)   PRAPARE - Hydrologist (Medical): No    Lack  of Transportation (Non-Medical): No  Physical Activity: Sufficiently Active (01/09/2022)   Exercise Vital Sign    Days of Exercise per Week: 7 days    Minutes of Exercise per Session: 30 min  Stress: No Stress Concern Present (01/09/2022)   [place]    Feeling of Stress : Not at all  Social Connections: Moderately Integrated (01/03/2021)   Social Connection and Isolation Panel [NHANES]    Frequency of Communication with Friends and Family: Three times a week    Frequency of Social Gatherings with Friends and Family: Three times a week    Attends Religious Services: More than 4 times per year    Active Member of Clubs or Organizations: No    Attends Archivist Meetings: Never    Marital Status: Married  Human resources officer Violence: Not At Risk (05/25/2022)   Humiliation, Afraid, Rape, and Kick questionnaire    Fear of Current or Ex-Partner: No    Emotionally Abused: No    Physically Abused: No    Sexually Abused: No   Family History  Problem Relation Age of Onset   Diabetes Mother    Asthma Mother    Hypertension Father    Stroke Father    Colon cancer Neg Hx    BP (!) 160/84   Pulse 100   Wt 133.1 kg (293 lb 6.4 oz)   SpO2 97%   BMI 39.79 kg/m   Wt Readings from Last 3 Encounters:  06/25/22 133.1 kg (293 lb 6.4 oz)  06/06/22 126.1 kg (278 lb)   04/13/22 130.8 kg (288 lb 6.4 oz)   PHYSICAL EXAM: General: NAD Neck: JVP 8-9 cm, no thyromegaly or thyroid nodule.  Lungs: Clear to auscultation bilaterally with normal respiratory effort. CV: Nondisplaced PMI.  Heart regular S1/S2, no S3/S4, no murmur.  1+ edema to knees.  No carotid bruit.  Normal pedal pulses.  Abdomen: Soft, nontender, no hepatosplenomegaly, no distention.  Skin: Intact without lesions or rashes.  Neurologic: Alert and oriented x 3.  Psych: Normal affect. Extremities: No clubbing or cyanosis.  HEENT: Normal.   ASSESSMENT & PLAN: 1.  Chronic diastolic CHF with RV failure:  Suspect RV strain from prior PEs.  Echo in 5/23 showed EF 55-60%, RV severely reduced, RVSP 75 mmHg, moderate to severe TR.  RHC in 5/23 showed elevated right and left heart filling pressures and pulmonary venous hypertension. Echo today showed EF 55-60%, mildly decreased RV function with mild-moderate RV enlargement, unable to estimate PA systolic pressure, IVC normal.  On exam today, he looks mildly volume overloaded.  Functional status is complicated by poor mobility due to spinal stenosis. Oxygen saturation is ok off oxygen today.  - I think he needs RHC to reassess filling pressures PA pressure, ?CTEPH though prior RHC had a somewhat surprisingly high PCWP (?true PCWP). We discussed risks/benefits and will plan study next week.  - Increase torsemide to 20 mg daily with BMET/BNP today and BMET in 10 days.  - Hold off on SGLT2i with recent UTI 2. Pulmonary hypertension: Somewhat confusing picture on 5/23 RHC with pulmonary venous hypertension in setting of very high PCWP (?accurate).   - Repeat RHC as above.  3. Tricuspid regurgitation: Moderate to severe on echo 05/23. Minimal on echo done today.   4. History PE/DVT: PE/DVT 09/21 s/p TPA, required pressor support with NE. Recurrent PE in 5/23 off Eliquis.  - Will need lifelong anticoagulation, on Eliquis 5 bid. No bleeding issues.  5. CKD stage 3:  BMET today.  6. HTN: BP elevated.  - Continue amlodipine 10 mg daily.  - Increase Coreg to 12.5 mg bid and add spironolactone 12.5 daily today.  7. Spinal stenosis: Limited mobility at baseline.   Loralie Champagne 06/25/2022

## 2022-06-25 NOTE — Progress Notes (Signed)
Echocardiogram 2D Echocardiogram has been performed.  Fidel Levy 06/25/2022, 10:49 AM

## 2022-06-26 ENCOUNTER — Other Ambulatory Visit: Payer: Self-pay | Admitting: Family Medicine

## 2022-06-26 ENCOUNTER — Telehealth: Payer: Self-pay | Admitting: Family Medicine

## 2022-06-26 DIAGNOSIS — I10 Essential (primary) hypertension: Secondary | ICD-10-CM

## 2022-06-26 NOTE — Telephone Encounter (Signed)
Charise PT with [facility] is calling and pt need reassessment for PT VO 1x1

## 2022-06-27 ENCOUNTER — Telehealth: Payer: Self-pay | Admitting: Family Medicine

## 2022-06-27 DIAGNOSIS — E1122 Type 2 diabetes mellitus with diabetic chronic kidney disease: Secondary | ICD-10-CM | POA: Diagnosis not present

## 2022-06-27 DIAGNOSIS — Z9981 Dependence on supplemental oxygen: Secondary | ICD-10-CM | POA: Diagnosis not present

## 2022-06-27 DIAGNOSIS — I1310 Hypertensive heart and chronic kidney disease without heart failure, with stage 1 through stage 4 chronic kidney disease, or unspecified chronic kidney disease: Secondary | ICD-10-CM | POA: Diagnosis not present

## 2022-06-27 DIAGNOSIS — N1831 Chronic kidney disease, stage 3a: Secondary | ICD-10-CM | POA: Diagnosis not present

## 2022-06-27 DIAGNOSIS — M5459 Other low back pain: Secondary | ICD-10-CM | POA: Diagnosis not present

## 2022-06-27 DIAGNOSIS — E785 Hyperlipidemia, unspecified: Secondary | ICD-10-CM | POA: Diagnosis not present

## 2022-06-27 DIAGNOSIS — M419 Scoliosis, unspecified: Secondary | ICD-10-CM | POA: Diagnosis not present

## 2022-06-27 DIAGNOSIS — M48061 Spinal stenosis, lumbar region without neurogenic claudication: Secondary | ICD-10-CM | POA: Diagnosis not present

## 2022-06-27 DIAGNOSIS — M1990 Unspecified osteoarthritis, unspecified site: Secondary | ICD-10-CM | POA: Diagnosis not present

## 2022-06-27 NOTE — Telephone Encounter (Signed)
Home health Phys therapy to extend PT 1x1/2x1

## 2022-06-27 NOTE — Telephone Encounter (Signed)
VO given to [employer].

## 2022-06-28 ENCOUNTER — Telehealth: Payer: Self-pay | Admitting: Pharmacist

## 2022-06-28 ENCOUNTER — Other Ambulatory Visit: Payer: Self-pay | Admitting: Family Medicine

## 2022-06-28 DIAGNOSIS — E785 Hyperlipidemia, unspecified: Secondary | ICD-10-CM | POA: Diagnosis not present

## 2022-06-28 DIAGNOSIS — I1310 Hypertensive heart and chronic kidney disease without heart failure, with stage 1 through stage 4 chronic kidney disease, or unspecified chronic kidney disease: Secondary | ICD-10-CM | POA: Diagnosis not present

## 2022-06-28 DIAGNOSIS — R258 Other abnormal involuntary movements: Secondary | ICD-10-CM

## 2022-06-28 DIAGNOSIS — E1122 Type 2 diabetes mellitus with diabetic chronic kidney disease: Secondary | ICD-10-CM | POA: Diagnosis not present

## 2022-06-28 DIAGNOSIS — M419 Scoliosis, unspecified: Secondary | ICD-10-CM | POA: Diagnosis not present

## 2022-06-28 DIAGNOSIS — M48061 Spinal stenosis, lumbar region without neurogenic claudication: Secondary | ICD-10-CM | POA: Diagnosis not present

## 2022-06-28 DIAGNOSIS — M5459 Other low back pain: Secondary | ICD-10-CM | POA: Diagnosis not present

## 2022-06-28 DIAGNOSIS — N1831 Chronic kidney disease, stage 3a: Secondary | ICD-10-CM | POA: Diagnosis not present

## 2022-06-28 DIAGNOSIS — Z9981 Dependence on supplemental oxygen: Secondary | ICD-10-CM | POA: Diagnosis not present

## 2022-06-28 DIAGNOSIS — M1990 Unspecified osteoarthritis, unspecified site: Secondary | ICD-10-CM | POA: Diagnosis not present

## 2022-06-28 NOTE — Chronic Care Management (AMB) (Signed)
Received phone message from patient, he reports he had received notification from [facility] that his Amlodipine had not been authorized for a refill. Call to [facility] confirmed that they have a prescription on file with refills and that he picked up a 90 DS on 8/30.   Call to patient to advise that he may disregard the message and that there are fills remaining on the prescription when he is in need. He confirms that he did pick up 90 DS on 8/30 and still has medication left. He further requested fills for his requip and notes that it has helped him tremendously he states he has 7 pills left, informed him that original prescription reflects one refill so he should have another left but that I would advise Pharmacist to see if she may assist with future refills. He was in agreement. During call patient states his Therapist was there with him and had a message for the office.  Phineas Semen reports that about 2 weeks ago he had forgotten to wear his oxygen at night and woke up with an 02 sat of 87%. Pharmacist advised. Patient to follow up with MP on 07/04/22     [place] Clinical Pharmacist Assistant [phone]

## 2022-07-02 DIAGNOSIS — N1831 Chronic kidney disease, stage 3a: Secondary | ICD-10-CM | POA: Diagnosis not present

## 2022-07-02 DIAGNOSIS — E1122 Type 2 diabetes mellitus with diabetic chronic kidney disease: Secondary | ICD-10-CM | POA: Diagnosis not present

## 2022-07-02 DIAGNOSIS — I1310 Hypertensive heart and chronic kidney disease without heart failure, with stage 1 through stage 4 chronic kidney disease, or unspecified chronic kidney disease: Secondary | ICD-10-CM | POA: Diagnosis not present

## 2022-07-02 DIAGNOSIS — M1990 Unspecified osteoarthritis, unspecified site: Secondary | ICD-10-CM | POA: Diagnosis not present

## 2022-07-02 DIAGNOSIS — M419 Scoliosis, unspecified: Secondary | ICD-10-CM | POA: Diagnosis not present

## 2022-07-02 DIAGNOSIS — M48061 Spinal stenosis, lumbar region without neurogenic claudication: Secondary | ICD-10-CM | POA: Diagnosis not present

## 2022-07-02 DIAGNOSIS — M5459 Other low back pain: Secondary | ICD-10-CM | POA: Diagnosis not present

## 2022-07-02 DIAGNOSIS — Z9981 Dependence on supplemental oxygen: Secondary | ICD-10-CM | POA: Diagnosis not present

## 2022-07-02 DIAGNOSIS — E785 Hyperlipidemia, unspecified: Secondary | ICD-10-CM | POA: Diagnosis not present

## 2022-07-03 ENCOUNTER — Telehealth: Payer: Self-pay | Admitting: Pharmacist

## 2022-07-03 ENCOUNTER — Telehealth: Payer: Self-pay | Admitting: Family Medicine

## 2022-07-03 ENCOUNTER — Other Ambulatory Visit: Payer: Self-pay | Admitting: Physical Medicine and Rehabilitation

## 2022-07-03 DIAGNOSIS — Z5320 Procedure and treatment not carried out because of patient's decision for unspecified reasons: Secondary | ICD-10-CM | POA: Diagnosis not present

## 2022-07-03 DIAGNOSIS — R7303 Prediabetes: Secondary | ICD-10-CM | POA: Diagnosis not present

## 2022-07-03 DIAGNOSIS — I10 Essential (primary) hypertension: Secondary | ICD-10-CM | POA: Diagnosis not present

## 2022-07-03 DIAGNOSIS — Z9181 History of falling: Secondary | ICD-10-CM | POA: Diagnosis not present

## 2022-07-03 DIAGNOSIS — Z79899 Other long term (current) drug therapy: Secondary | ICD-10-CM | POA: Diagnosis not present

## 2022-07-03 DIAGNOSIS — M25571 Pain in right ankle and joints of right foot: Secondary | ICD-10-CM | POA: Diagnosis not present

## 2022-07-03 NOTE — Chronic Care Management (AMB) (Signed)
    Chronic Care Management Pharmacy Assistant   Name: Keith [name]  MRN: [id number] DOB: [birth year]  07/03/22 APPOINTMENT REMINDER   Called Patient No answer, left message of appointment on 07/04/22 at 10:45 via telephone visit with Jeni Salles, Pharm D.   Notified to have all medications, supplements, blood pressure and/or blood sugar logs available during appointment and to return call if need to reschedule.  Care Gaps: Eve Exam - Overdue COVID Booster - Overdue Flu Vaccine - Overdue Zoster Vaccine - Postponed BP- 160/84 06/25/22 AWV- 01/09/22 Lab Results  Component Value Date   HGBA1C 6.6 (H) 01/17/2022    Star Rating Drug: Rosuvastatin 20 mg -No fill hx Simvastatin 40 mg - Last filled 03/15/22 [facility] at [facility]     Medications: Outpatient Encounter Medications as of 07/03/2022  Medication Sig   acetaminophen (TYLENOL) 325 MG tablet Take 1-2 tablets (325-650 mg total) by mouth every 4 (four) hours as needed for mild pain.   amLODipine (NORVASC) 10 MG tablet Take 1 tablet (10 mg total) by mouth daily.   apixaban (ELIQUIS) 5 MG TABS tablet Take 1 tablet (5 mg total) by mouth 2 (two) times daily.   Blood Glucose Monitoring Suppl (ACCU-CHEK GUIDE ME) w/Device KIT USE TO TEST BLOOD SUGARS 1-2 TIMES DAILY.   carvedilol (COREG) 12.5 MG tablet Take 1 tablet (12.5 mg total) by mouth 2 (two) times daily with a meal.   cyanocobalamin (VITAMIN B12) 1000 MCG tablet Take 1,000 mcg by mouth daily.   DULoxetine (CYMBALTA) 60 MG capsule TAKE 1 CAPSULE BY MOUTH EVERY DAY   ferrous sulfate 325 (65 FE) MG tablet Take 1 tablet (325 mg total) by mouth daily with breakfast.   gabapentin (NEURONTIN) 300 MG capsule Take 1 capsule (300 mg total) by mouth 2 (two) times daily.   HYDROcodone-acetaminophen (NORCO) 10-325 MG tablet Take 1 tablet by mouth in the morning, at noon, and at bedtime.   hydrocortisone (ANUSOL-HC) 2.5 % rectal cream Place 1 application. rectally as needed for hemorrhoids or anal  itching.   levETIRAcetam (KEPPRA) 250 MG tablet TAKE 1 TABLET BY MOUTH TWICE A DAY   lidocaine (LIDODERM) 5 % Place 1 patch onto the skin daily as needed (pain). Remove & Discard patch within 12 hours or as directed by MD   naloxone (NARCAN) nasal spray 4 mg/0.1 mL Place 1 spray into the nose as needed (accidental overdose).   polyethylene glycol (MIRALAX / GLYCOLAX) 17 g packet Take 17 g by mouth daily as needed for mild constipation.   rOPINIRole (REQUIP) 1 MG tablet TAKE 0.5-1 TABLETS (0.5-1 MG TOTAL) BY MOUTH AT BEDTIME. (Patient taking differently: Take 1 mg by mouth at bedtime.)   rosuvastatin (CRESTOR) 20 MG tablet Take 1 tablet (20 mg total) by mouth daily.   spironolactone (ALDACTONE) 25 MG tablet Take 0.5 tablets (12.5 mg total) by mouth daily.   torsemide (DEMADEX) 20 MG tablet Take 1 tablet (20 mg total) by mouth daily.   traZODone (DESYREL) 50 MG tablet TAKE 1/2 TO 1 TABLET BY MOUTH AT BEDTIME AS NEEDED FOR SLEEP (Patient taking differently: Take 25 mg by mouth at bedtime as needed for sleep. for sleep)   No facility-administered encounter medications on file as of 07/03/2022.       [place] Clinical Pharmacist Assistant [phone]

## 2022-07-03 NOTE — Telephone Encounter (Signed)
I left Keith [name] a voicemail to return my call. It is not identified on her VM if we can leave orders.

## 2022-07-03 NOTE — Telephone Encounter (Signed)
Charri PT with [facility] is calling and pt needs PT VO 2x3, 1x1

## 2022-07-04 ENCOUNTER — Ambulatory Visit: Payer: Medicare Other | Admitting: Pharmacist

## 2022-07-04 DIAGNOSIS — Z9981 Dependence on supplemental oxygen: Secondary | ICD-10-CM | POA: Diagnosis not present

## 2022-07-04 DIAGNOSIS — M48061 Spinal stenosis, lumbar region without neurogenic claudication: Secondary | ICD-10-CM | POA: Diagnosis not present

## 2022-07-04 DIAGNOSIS — M419 Scoliosis, unspecified: Secondary | ICD-10-CM | POA: Diagnosis not present

## 2022-07-04 DIAGNOSIS — N1831 Chronic kidney disease, stage 3a: Secondary | ICD-10-CM | POA: Diagnosis not present

## 2022-07-04 DIAGNOSIS — M1990 Unspecified osteoarthritis, unspecified site: Secondary | ICD-10-CM | POA: Diagnosis not present

## 2022-07-04 DIAGNOSIS — E1122 Type 2 diabetes mellitus with diabetic chronic kidney disease: Secondary | ICD-10-CM | POA: Diagnosis not present

## 2022-07-04 DIAGNOSIS — I1310 Hypertensive heart and chronic kidney disease without heart failure, with stage 1 through stage 4 chronic kidney disease, or unspecified chronic kidney disease: Secondary | ICD-10-CM | POA: Diagnosis not present

## 2022-07-04 DIAGNOSIS — E785 Hyperlipidemia, unspecified: Secondary | ICD-10-CM | POA: Diagnosis not present

## 2022-07-04 DIAGNOSIS — I10 Essential (primary) hypertension: Secondary | ICD-10-CM

## 2022-07-04 DIAGNOSIS — M5459 Other low back pain: Secondary | ICD-10-CM | POA: Diagnosis not present

## 2022-07-04 NOTE — Progress Notes (Signed)
Chronic Care Management Pharmacy Note  07/04/2022 Name:  Keith [name] MRN:  [id number] DOB:  1950/12/17  Summary: LDL not at goal < 70 BP not at goal < 130/80 per home and office readings  Recommendations/Changes made from today's visit: -Recommend repeat lipid panel  -Recommended keeping up with daily weights for assessing fluid build up -Recommended bringing BP cuff to office visit to ensure accuracy  Plan: PCP follow up in 2 weeks BP assessment in 2 months Follow up in 4 months   Subjective: Keith [name] is an 71 y.o. year old male who is a primary patient of [name], Malka So, MD.  The CCM team was consulted for assistance with disease management and care coordination needs.    Engaged with patient by telephone for follow up visit in response to provider referral for pharmacy case management and/or care coordination services.   Consent to Services:  The patient was given information about Chronic Care Management services, agreed to services, and gave verbal consent prior to initiation of services.  Please see initial visit note for detailed documentation.   Patient Care Team: [name], [name], MD as PCP - General (Family Medicine) Viona Gilmore, [facility] as Pharmacist (Pharmacist)  Recent office visits: 06/06/22 Betty [name], MD: Patient presented for hypertension and nocturnal leg movements.  Prescribed ropinirole 1 mg 1/2-1 tablet at bedtime. Recommended ferrous sulfate daily.  Follow up in 2 months.  04/02/22 [name], [name], MD - Patient presented for Epididymorchitis without abscess and other concerns. No medication changes.   03/27/22 [name], [name], MD - Patient presented for Testicular pain and other concerns. No medication changes.  03/09/22 [name], [name], MD - Patient presented for Acute pulmonary embolism without acute cor pulmonale unspecified pulmonary embolism and other concerns. No medication changes. Continue Eliquis. Continue Carvedilol.  01/17/22  Betty [name], MD: Patient presented for annual exam. Follow up in 6 months.   01/09/22 Glenna Durand, LPN: Patient presented for AWV.  Recent consult visits: 06/25/22 Loralie Champagne, MD (cardiology): Patient presented for CHF follow up.  BP elevated in office.  Increased Coreg to 12.5 mg BID and prescribed spironolactone 12.5 mg daily. Follow up in 1 month.   04/30/22 Patsy Lager [facility]): Patient presented for back pain follow up. Unable to access notes.  04/13/22 Keturah Shavers, FNP (heart failure clinic): Patient presented for CHF follow up. Plan to start Entresto 24/26 mg BID pending BMET. K was too high and Entresto was not started.  03/14/22 Joette Catching, PA-C - Patient presented for Acute on CHF and other concerns. Prescribed Spironolactone 12.5 mg daily.   03/02/22 Dixie Dials, MD - Patient presented for Right/Left Cath & Coronary Angiography. No medication changes.  Hospital visits: Medication Reconciliation was completed by comparing discharge summary, patient's EMR and Pharmacy list, and upon discussion with patient.   Patient presented to [facility] ED  on 03/27/22 due to Hydrocele and other concerns. Patient was present for 3 hours.   New?Medications Started at [facility] Discharge:?? -started  cephALEXin 500 MG HYDROcodone-acetaminophen 5-325   Medication Changes at Hospital Discharge: -Changed  none   Medications Discontinued at Hospital Discharge: -Stopped none   Medications that remain the same after Hospital Discharge:??  -All other medications will remain the same.    Medication Reconciliation was completed by comparing discharge summary, patient's EMR and Pharmacy list, and upon discussion with patient.   Patient presented to [facility] on 02/28/22 due to Chest Pain.  Patient was present for 6 days.   New?Medications Started at [facility] Discharge:?? -started  carvedilol (COREG) Eliquis (apixaban) apixaban  Arne [place]) Start taking on: March 09, 2022 gabapentin (NEURONTIN) This replaces a similar medication. See the full medication list for instructions. Proctozone-HC (hydrocortisone) torsemide (DEMADEX) Start taking on: Mar 07, 2022   Medication Changes at Hospital Discharge: -Changed  none   Medications Discontinued at Hospital Discharge: -Stopped atenolol-chlorthalidone 50-25 MG tablet (TENORETIC) gabapentin 600 MG tablet (NEURONTIN) Replaced by a similar medication.   Medications that remain the same after Hospital Discharge:??  -All other medications will remain the same.   Objective:  Lab Results  Component Value Date   CREATININE 1.14 06/25/2022   BUN 13 06/25/2022   GFR 40.06 (L) 01/17/2022   GFRNONAA >60 06/25/2022   GFRAA >60 07/11/2020   NA 143 06/25/2022   K 4.7 06/25/2022   CALCIUM 10.2 06/25/2022   CO2 27 06/25/2022   GLUCOSE 141 (H) 06/25/2022    Lab Results  Component Value Date/Time   HGBA1C 6.6 (H) 01/17/2022 11:40 AM   HGBA1C 6.0 09/13/2021 10:14 AM   FRUCTOSAMINE 275 11/19/2018 02:42 PM   GFR 40.06 (L) 01/17/2022 11:40 AM   GFR 35.41 (L) 09/13/2021 10:14 AM   MICROALBUR 11.3 (H) 01/17/2022 11:40 AM   MICROALBUR 8.1 (H) 03/30/2020 04:50 PM    Last diabetic Eye exam: No results found for: "HMDIABEYEEXA"  Last diabetic Foot exam: No results found for: "HMDIABFOOTEX"   Lab Results  Component Value Date   CHOL 140 01/17/2022   HDL 50.80 01/17/2022   LDLCALC 73 01/17/2022   TRIG 81.0 01/17/2022   CHOLHDL 3 01/17/2022       Latest Ref Rng & Units 03/05/2022   12:34 AM 03/04/2022    1:13 AM 03/02/2022    6:50 PM  Hepatic Function  Total Protein 6.5 - 8.1 g/dL  7.6  7.1   Albumin 3.5 - 5.0 g/dL 3.5  3.5  3.3   AST 15 - 41 U/L  30  84   ALT 0 - 44 U/L  55  72   Alk Phosphatase 38 - 126 U/L  106  97   Total Bilirubin 0.3 - 1.2 mg/dL  0.4  1.0     Lab Results  Component Value Date/Time   TSH 0.80 11/11/2017 09:20 AM   TSH 0.58 11/05/2016 08:37  AM       Latest Ref Rng & Units 04/02/2022   11:56 AM 03/27/2022    8:02 PM 03/14/2022    4:03 PM  CBC  WBC 4.0 - 10.5 K/uL 8.0  8.1  3.9   Hemoglobin 13.0 - 17.0 g/dL 9.7  9.3  11.4   Hematocrit 39.0 - 52.0 % 29.8  28.8  35.7   Platelets 150.0 - 400.0 K/uL 368.0  274  261     No results found for: "VD25OH"  Clinical ASCVD: No  The 10-year ASCVD risk score (Arnett DK, et al., 2019) is: 41.2%   Values used to calculate the score:     Age: 72 years     Sex: Male     Is Non-Hispanic African American: Yes     Diabetic: Yes     Tobacco smoker: No     Systolic Blood Pressure: 962 mmHg     Is BP treated: Yes     HDL Cholesterol: 50.8 mg/dL     Total Cholesterol: 140 mg/dL       06/06/2022   11:02 AM 05/25/2022  11:39 AM 04/02/2022   11:25 AM  Depression screen PHQ 2/9  Decreased Interest 0 0 0  Down, Depressed, Hopeless 0 0 0  PHQ - 2 Score 0 0 0      Social History   Tobacco Use  Smoking Status Former   Packs/day: 1.00   Years: 25.00   Total pack years: 25.00   Types: Cigarettes   Quit date: 1995   Years since quitting: 28.7  Smokeless Tobacco Never   BP Readings from Last 3 Encounters:  06/25/22 (!) 160/84  06/06/22 (!) 142/100  04/13/22 130/72   Pulse Readings from Last 3 Encounters:  06/25/22 100  06/06/22 88  04/13/22 97   Wt Readings from Last 3 Encounters:  06/25/22 293 lb 6.4 oz (133.1 kg)  06/06/22 278 lb (126.1 kg)  04/13/22 288 lb 6.4 oz (130.8 kg)   BMI Readings from Last 3 Encounters:  06/25/22 39.79 kg/m  06/06/22 37.70 kg/m  04/13/22 39.11 kg/m    Assessment/Interventions: Review of patient past medical history, allergies, medications, health status, including review of consultants reports, laboratory and other test data, was performed as part of comprehensive evaluation and provision of chronic care management services.   SDOH:  (Social Determinants of Health) assessments and interventions performed: Yes (last 12/27/21) SDOH  Interventions    Flowsheet Row Patient Outreach Telephone from 05/25/2022 in [place] ED to Hosp-Admission (Discharged) from 02/28/2022 in [place] Management from 12/27/2021 in [place] at [place] Interventions Intervention Not Indicated Intervention Not Indicated --  Housing Interventions Intervention Not Indicated Intervention Not Indicated --  Transportation Interventions Intervention Not Indicated Other (Comment)  [Pt's spouse can help with transportation] Other (Comment)  [Provided cone transportation phone number]  Financial Strain Interventions -- Intervention Not Indicated Intervention Not Indicated      SDOH Screenings   Food Insecurity: No Food Insecurity (05/25/2022)  Housing: Low Risk  (05/25/2022)  Transportation Needs: No Transportation Needs (05/25/2022)  Depression (PHQ2-9): Low Risk  (06/06/2022)  Financial Resource Strain: Low Risk  (03/06/2022)  Physical Activity: Sufficiently Active (01/09/2022)  Social Connections: Moderately Integrated (01/03/2021)  Stress: No Stress Concern Present (01/09/2022)  Tobacco Use: Medium Risk (06/25/2022)    CCM Care Plan  Allergies  Allergen Reactions   Aspirin Other (See Comments)    Irritates the stomach and the patient developed ulcers, also   Lisinopril Other (See Comments)    Caused a body ache    Medications Reviewed Today     Reviewed by Merceda Elks, CPhT (Pharmacy Technician) on 07/02/22 at 1114  Med List Status: Complete   Medication Order Taking? Sig Documenting Provider Last Dose Status Informant  acetaminophen (TYLENOL) 325 MG tablet [id number] Yes Take 1-2 tablets (325-650 mg total) by mouth every 4 (four) hours as needed for mild pain. Bary Leriche, PA-C  Active Self  amLODipine (NORVASC) 10 MG tablet [id number] Yes Take 1 tablet (10 mg total) by mouth daily. [name], [name], MD   Active Self  apixaban (ELIQUIS) 5 MG TABS tablet [id number] Yes Take 1 tablet (5 mg total) by mouth 2 (two) times daily. Nita Sells, MD  Active Self  Blood Glucose Monitoring Suppl (ACCU-CHEK GUIDE ME) w/Device KIT [id number]  USE TO TEST BLOOD SUGARS 1-2 TIMES DAILY. [name], [name], MD  Active Self  carvedilol (COREG) 12.5 MG tablet [id number] Yes Take 1 tablet (12.5 mg total) by mouth  2 (two) times daily with a meal. Larey Dresser, MD  Active Self  cyanocobalamin (VITAMIN B12) 1000 MCG tablet [id number] Yes Take 1,000 mcg by mouth daily. [provider]  Active   DULoxetine (CYMBALTA) 60 MG capsule [id number] Yes TAKE 1 CAPSULE BY MOUTH EVERY DAY Lovorn, Megan, MD  Active Self  ferrous sulfate 325 (65 FE) MG tablet [id number] Yes Take 1 tablet (325 mg total) by mouth daily with breakfast. [name], [name], MD  Active Self  gabapentin (NEURONTIN) 300 MG capsule [id number] Yes Take 1 capsule (300 mg total) by mouth 2 (two) times daily. Nita Sells, MD  Active Self  HYDROcodone-acetaminophen [facility]) 10-325 MG tablet [id number] Yes Take 1 tablet by mouth in the morning, at noon, and at bedtime. [provider]  Active Self    Discontinued 07/02/22 1110 (Completed Course) hydrocortisone (ANUSOL-HC) 2.5 % rectal cream [id number] Yes Place 1 application. rectally as needed for hemorrhoids or anal itching. [provider]  Active Self  levETIRAcetam (KEPPRA) 250 MG tablet [id number] Yes TAKE 1 TABLET BY MOUTH TWICE A DAY Lovorn, Megan, MD  Active Self  lidocaine (LIDODERM) 5 % [id number] Yes Place 1 patch onto the skin daily as needed (pain). Remove & Discard patch within 12 hours or as directed by MD [provider]  Active Self  naloxone (NARCAN) nasal spray 4 mg/0.1 mL [id number] Yes Place 1 spray into the nose as needed (accidental overdose). [provider]  Active Self  polyethylene glycol (MIRALAX / GLYCOLAX) 17 g packet [id number] Yes Take 17 g by  mouth daily as needed for mild constipation. Bary Leriche, PA-C  Active Self  rOPINIRole (REQUIP) 1 MG tablet [id number] Yes TAKE 0.5-1 TABLETS (0.5-1 MG TOTAL) BY MOUTH AT BEDTIME.  Patient taking differently: Take 1 mg by mouth at bedtime.   [name], [name], MD  Active Self  rosuvastatin (CRESTOR) 20 MG tablet [id number] Yes Take 1 tablet (20 mg total) by mouth daily. [name], [name], MD  Active Self  spironolactone (ALDACTONE) 25 MG tablet [id number] Yes Take 0.5 tablets (12.5 mg total) by mouth daily. Larey Dresser, MD  Active Self  torsemide (DEMADEX) 20 MG tablet [id number] Yes Take 1 tablet (20 mg total) by mouth daily. Larey Dresser, MD  Active Self  traZODone (DESYREL) 50 MG tablet [id number] Yes TAKE 1/2 TO 1 TABLET BY MOUTH AT BEDTIME AS NEEDED FOR SLEEP  Patient taking differently: Take 25 mg by mouth at bedtime as needed for sleep. for sleep   Courtney Heys, MD  Active Self            Patient Active Problem List   Diagnosis Date Noted   Chest pain 03/01/2022   Elevated liver enzymes 03/01/2022   Myofascial pain 10/30/2021   Controlled type 2 diabetes mellitus with hyperglycemia, without long-term current use of insulin (HCC)    Bilateral leg weakness 08/08/2021   CAP (community acquired pneumonia) 11/19/2020   Loculated pleural effusion 11/19/2020   DNR (do not resuscitate) 11/19/2020   Closed right ankle fracture    Labile blood glucose    Hypokalemia    Drug induced constipation    Debility    Hyponatremia    Diabetic peripheral neuropathy ([place])    Acute blood loss anemia    Chronic pain syndrome    Postoperative pain    Closed right trimalleolar fracture 07/02/2020   Chronic respiratory failure with hypoxia (HCC)    Lumbar spinal stenosis 06/01/2020   Spinal cord  compression ([place]) 04/02/2020   Thoracic myelopathy 07/25/2018   Hyperlipidemia associated with type 2 diabetes mellitus ([place]) 11/14/2016   Type 2 diabetes mellitus with diabetic neuropathy,  unspecified ([place]) 11/14/2016   Routine general medical examination at a health care facility 11/14/2015   Hiatal hernia 08/30/2014   CKD (chronic kidney disease), stage III ([place]) 04/10/2013   Allergic rhinitis 04/10/2013   Spinal stenosis of lumbar region 11/06/2012   Allergic rhinitis due to pollen 01/15/2011   Osteoarthritis 11/02/2008   HIP PAIN, LEFT 02/09/2008   Morbid obesity ([place]) BMI 41 11/07/2007   ERECTILE DYSFUNCTION, MILD 11/07/2007   Essential hypertension 11/07/2007    Immunization History  Administered Date(s) Administered   PFIZER(Purple Top)SARS-COV-2 Vaccination 12/20/2019, 01/09/2020, 09/24/2020   Pneumococcal Conjugate-13 11/11/2017, 11/24/2020   Pneumococcal Polysaccharide-23 11/09/2014, 03/30/2020   Td 10/09/2003   Tdap 11/09/2013    Patient is still in therapy in a walker and wheelchair. He has gone from wheelchair to walker and has PT coming out today.   Patient lost 20 lbs when he cut out sugar and salt over the span of a few weeks. He feels like he is losing 1/2-1 lb per day.  Patient is only consistently weighing himself twice a week and his balance is not there. He is consistent when PT comes out. He usually drinks tea and coffee 3 times a week.   Patient hasn't had any side effects with his rosuvastatin so far.   Conditions to be addressed/monitored:  Hypertension, Hyperlipidemia, Diabetes, Heart Failure, and Pain  Conditions addressed this visit: Hypertension, heart failure  Care Plan : [place]  Updates made by Viona Gilmore, [place] since 07/04/2022 12:00 AM     Problem: Problem: Hypertension, Hyperlipidemia, Heart failure, Diabetes, and Pain      Long-Range Goal: Patient-Specific Goal   Start Date: 12/27/2021  Expected End Date: 12/28/2022  Recent Progress: On track  Priority: High  Note:   Current Barriers:  Unable to independently monitor therapeutic efficacy Unable to achieve control of blood pressure   Pharmacist  Clinical Goal(s):  Patient will achieve adherence to monitoring guidelines and medication adherence to achieve therapeutic efficacy achieve control of cholesterol as evidenced by next lipid panel  through collaboration with PharmD and provider.   Interventions: 1:1 collaboration with [name], [name], MD regarding development and update of comprehensive plan of care as evidenced by provider attestation and co-signature Inter-disciplinary care team collaboration (see longitudinal plan of care) Comprehensive medication review performed; medication list updated in electronic medical record  Hypertension (BP goal <130/80) -Uncontrolled -Current treatment: Spironolactone 25 mg 1/2 tablet daily - Appropriate, Query effective, Safe, Accessible Carvedilol 12.5 mg 1 tablet twice daily - Appropriate, Query effective, Safe, Accessible Amlodipine 10 mg 1 tablet daily - Appropriate, Query effective, Safe, Accessible -Medications previously tried: clonidine, atenolol-chlorthalidone, lisinopril (body ache)  -Current home readings: 127-150/70-80s, 146/84 (arm cuff; checking every day) -Current dietary habits: he stopped eating sugar and salt cold [place] -Current exercise habits: doing PT exercises -Denies hypotensive/hypertensive symptoms -Educated on BP goals and benefits of medications for prevention of heart attack, stroke and kidney damage; Daily salt intake goal < 2300 mg; Importance of home blood pressure monitoring; Proper BP monitoring technique; Symptoms of hypotension and importance of maintaining adequate hydration; -Counseled to monitor BP at home at least weekly, document, and provide log at future appointments -Counseled on diet and exercise extensively Recommended to continue current medication  Hyperlipidemia: (LDL goal < 70) -Uncontrolled -Current treatment: Rosuvastatin 20 mg  1 tablet daily - Appropriate, Query effective, Safe, Accessible -Medications previously tried: simvastatin  (switched to rosuvastatin due to DDI with amlodipine) -Current dietary patterns: eating mostly at home; conscious and salt and sugar intake -Current exercise habits: PT exercise -Educated on Cholesterol goals;  Benefits of statin for ASCVD risk reduction; Importance of limiting foods high in cholesterol; -Counseled on diet and exercise extensively Recommended repeat lipid panel.  Diabetes (A1c goal <7%) -Controlled -Current medications: No medications -Medications previously tried: metformin (not needed) -Current home glucose readings fasting glucose: checks every one in a while  post prandial glucose: checks every one in a while -Denies hypoglycemic/hyperglycemic symptoms -Current meal patterns: 3 meals a day (trying to cut down on portions) breakfast:  did not discuss lunch: did not discuss  dinner: did not discuss snacks: does snack some - popcorn and graham crackers drinks: big water fan, Dr. Malachi Bonds zero every once in a while, no tea or coffee or juice -Current exercise: PT exercises -Educated on A1c and blood sugar goals; Exercise goal of 150 minutes per week; Carbohydrate counting and/or plate method -Counseled to check feet daily and get yearly eye exams -Counseled on diet and exercise extensively  Heart Failure (Goal: manage symptoms and prevent exacerbations) -Controlled -Last ejection fraction: 60-65% (Date: 2023) -HF type: Diastolic -NYHA Class: II (slight limitation of activity) -AHA HF Stage: C (Heart disease and symptoms present) -Current treatment: Torsemide 20 mg 1 tablet daily - Appropriate, Effective, Safe, Accessible Spironolactone 25 mg 1/2 tablet daily - Appropriate, Effective, Safe, Accessible Carvedilol 12.5 mg 1 tablet twice daily - Appropriate, Effective, Safe, Accessible -Medications previously tried: none  -Current home BP/HR readings: refer to above -Current dietary habits: cut out salt intake -Current exercise habits: limited with limited  mobility -Educated on Benefits of medications for managing symptoms and prolonging life Importance of weighing daily; if you gain more than 3 pounds in one day or 5 pounds in one week, call cardiology. Importance of blood pressure control -Counseled on diet and exercise extensively Recommended to continue current medication  Neuropathy/pain (Goal: minimize pain) -Not ideally controlled -Current treatment  Gabapentin 300 mg 1 tablet twice daily - Appropriate, Query effective, Safe, Accessible Hydrocodone-APAP 10-325 mg 1 tablets three times a day - Appropriate, Effective, Query Safe, Accessible Acetaminophen 325 mg 1-2 tablets as needed - Appropriate, Effective, Safe, Accessible Levetiracetam 250 mg 1 tablet twice daily - Appropriate, Effective, Safe, Accessible Duloxetine 60 mg 1 capsule daily (also for mood swings) - Appropriate, Effective, Safe, Accessible Lidocaine patch as needed - Appropriate, Query effective, Safe, Accessible -Medications previously tried: oxycodone (switched to hydrocodone), diazepam (sleepiness) -Counseled on maximum of 3,000 mg per day of acetaminophen (Tylenol). Recommended alternating Tylenol with oxycodone-APAP.   Restless legs syndrome (Goal: minimize symptoms) -Controlled -Current treatment  Ropinirole 1 mg 1/2-1 tablet at bedtime - Appropriate, Effective, Safe, Accessible -Medications previously tried: gabapentin (didn't help)  -Recommended to continue current medication   Health Maintenance -Vaccine gaps: influenza (patient declines due to sickness), shingrix, COVID booster -Current therapy:  Potassium chloride 100 mg 1 tablet daily Multivitamin 1 tablet daily Narcan as needed Miralax daily as needed -Educated on Cost vs benefit of each product must be carefully weighed by individual consumer -Patient is satisfied with current therapy and denies issues -Recommended to continue current medication  Patient Goals/Self-Care Activities Patient  will:  - take medications as prescribed as evidenced by patient report and record review check glucose as needed, document, and provide at future appointments check blood pressure a few  times a week, document, and provide at future appointments  Follow Up Plan: Telephone follow up appointment with care management team member scheduled for: 4 months       Medication Assistance: None required.  Patient affirms current coverage meets needs.  Compliance/Adherence/Medication fill history: Care Gaps: COVID booster, shingrix, influenza, eye exam BP- 160/84 06/25/22 A1c - 6.6% (01/17/22)  Star-Rating Drugs: Rosuvastatin 20 mg - no fill hx Simvastatin 40 mg - [facility]15/[facility]  Patient's preferred [facility] is:  [id number] [place] [address] [address]Phone: [phone]: [phone] [facility] [address]Phone: [phone]: [phone] Uses pill box? No - doesn't feel like he needs it   Pt endorses 99% compliance  We discussed: Current pharmacy is preferred with insurance plan and patient is satisfied with pharmacy services Patient decided to: Continue current medication management strategy  Care Plan and Follow Up Patient Decision:  Patient agrees to Care Plan and Follow-up.  Plan: Telephone follow up appointment with care management team member scheduled for:  4 months  MJeni Salles PharmD, BPonceat [phone]

## 2022-07-04 NOTE — Telephone Encounter (Signed)
VO given to [employer].

## 2022-07-04 NOTE — Patient Instructions (Signed)
Hi Abdirizak,  It was great to get to catch up with you again! I attached some information about blood pressure monitoring at home that might be a good refresher as we work to maintain it in a good spot.  Please reach out to me if you have any questions or need anything before our follow up!  Best, Maddie  Jeni Salles, PharmD, [place] Pharmacist [place] [facility] [place]

## 2022-07-06 ENCOUNTER — Other Ambulatory Visit: Payer: Self-pay

## 2022-07-06 ENCOUNTER — Ambulatory Visit (HOSPITAL_COMMUNITY)
Admission: RE | Admit: 2022-07-06 | Discharge: 2022-07-06 | Disposition: A | Discharge status: Home / Self Care | Payer: Medicare Other | Attending: Cardiology | Admitting: Cardiology

## 2022-07-06 ENCOUNTER — Telehealth (HOSPITAL_COMMUNITY): Payer: Self-pay | Admitting: Pharmacist

## 2022-07-06 ENCOUNTER — Other Ambulatory Visit (HOSPITAL_COMMUNITY): Payer: Self-pay

## 2022-07-06 ENCOUNTER — Encounter (HOSPITAL_COMMUNITY)
Admission: RE | Disposition: A | Discharge status: Home / Self Care | Payer: Self-pay | Source: Home / Self Care | Attending: Cardiology

## 2022-07-06 DIAGNOSIS — Z86711 Personal history of pulmonary embolism: Secondary | ICD-10-CM | POA: Diagnosis not present

## 2022-07-06 DIAGNOSIS — I2721 Secondary pulmonary arterial hypertension: Secondary | ICD-10-CM | POA: Diagnosis not present

## 2022-07-06 DIAGNOSIS — E1122 Type 2 diabetes mellitus with diabetic chronic kidney disease: Secondary | ICD-10-CM | POA: Diagnosis not present

## 2022-07-06 DIAGNOSIS — Z6837 Body mass index (BMI) 37.0-37.9, adult: Secondary | ICD-10-CM | POA: Diagnosis not present

## 2022-07-06 DIAGNOSIS — Z79899 Other long term (current) drug therapy: Secondary | ICD-10-CM | POA: Insufficient documentation

## 2022-07-06 DIAGNOSIS — E669 Obesity, unspecified: Secondary | ICD-10-CM | POA: Diagnosis not present

## 2022-07-06 DIAGNOSIS — Z86718 Personal history of other venous thrombosis and embolism: Secondary | ICD-10-CM | POA: Insufficient documentation

## 2022-07-06 DIAGNOSIS — M4800 Spinal stenosis, site unspecified: Secondary | ICD-10-CM | POA: Diagnosis not present

## 2022-07-06 DIAGNOSIS — N183 Chronic kidney disease, stage 3 unspecified: Secondary | ICD-10-CM | POA: Insufficient documentation

## 2022-07-06 DIAGNOSIS — I272 Pulmonary hypertension, unspecified: Secondary | ICD-10-CM | POA: Diagnosis not present

## 2022-07-06 DIAGNOSIS — Z7901 Long term (current) use of anticoagulants: Secondary | ICD-10-CM | POA: Insufficient documentation

## 2022-07-06 DIAGNOSIS — I5032 Chronic diastolic (congestive) heart failure: Secondary | ICD-10-CM | POA: Insufficient documentation

## 2022-07-06 DIAGNOSIS — Z87891 Personal history of nicotine dependence: Secondary | ICD-10-CM | POA: Insufficient documentation

## 2022-07-06 DIAGNOSIS — I130 Hypertensive heart and chronic kidney disease with heart failure and stage 1 through stage 4 chronic kidney disease, or unspecified chronic kidney disease: Secondary | ICD-10-CM | POA: Insufficient documentation

## 2022-07-06 HISTORY — PX: RIGHT HEART CATH: CATH118263

## 2022-07-06 LAB — POCT I-STAT EG7
Acid-Base Excess: 3 mmol/L — ABNORMAL HIGH (ref 0.0–2.0)
Acid-Base Excess: 2 mmol/L (ref 0.0–2.0)
Bicarbonate: 28.5 mmol/L — ABNORMAL HIGH (ref 20.0–28.0)
Bicarbonate: 27.3 mmol/L (ref 20.0–28.0)
Calcium, Ion: 1.32 mmol/L (ref 1.15–1.40)
Calcium, Ion: 1.3 mmol/L (ref 1.15–1.40)
HCT: 34 % — ABNORMAL LOW (ref 39.0–52.0)
HCT: 33 % — ABNORMAL LOW (ref 39.0–52.0)
Hemoglobin: 11.6 g/dL — ABNORMAL LOW (ref 13.0–17.0)
Hemoglobin: 11.2 g/dL — ABNORMAL LOW (ref 13.0–17.0)
O2 Saturation: 60 %
O2 Saturation: 59 %
Potassium: 4.2 mmol/L (ref 3.5–5.1)
Potassium: 4 mmol/L (ref 3.5–5.1)
Sodium: 144 mmol/L (ref 135–145)
Sodium: 144 mmol/L (ref 135–145)
TCO2: 30 mmol/L (ref 22–32)
TCO2: 29 mmol/L (ref 22–32)
pCO2, Ven: 47.7 mmHg (ref 44–60)
pCO2, Ven: 45.7 mmHg (ref 44–60)
pH, Ven: 7.384 (ref 7.25–7.43)
pH, Ven: 7.384 (ref 7.25–7.43)
pO2, Ven: 32 mmHg (ref 32–45)
pO2, Ven: 31 mmHg — CL (ref 32–45)

## 2022-07-06 LAB — CBC
HCT: 37.1 % — ABNORMAL LOW (ref 39.0–52.0)
Hemoglobin: 11.7 g/dL — ABNORMAL LOW (ref 13.0–17.0)
MCH: 30.7 pg (ref 26.0–34.0)
MCHC: 31.5 g/dL (ref 30.0–36.0)
MCV: 97.4 fL (ref 80.0–100.0)
Platelets: 219 10*3/uL (ref 150–400)
RBC: 3.81 MIL/uL — ABNORMAL LOW (ref 4.22–5.81)
RDW: 14.4 % (ref 11.5–15.5)
WBC: 5.5 10*3/uL (ref 4.0–10.5)
nRBC: 0 % (ref 0.0–0.2)

## 2022-07-06 LAB — BASIC METABOLIC PANEL
Anion gap: 7 (ref 5–15)
BUN: 21 mg/dL (ref 8–23)
CO2: 27 mmol/L (ref 22–32)
Calcium: 9.9 mg/dL (ref 8.9–10.3)
Chloride: 107 mmol/L (ref 98–111)
Creatinine, Ser: 1.42 mg/dL — ABNORMAL HIGH (ref 0.61–1.24)
GFR, Estimated: 53 mL/min — ABNORMAL LOW (ref >60)
Glucose, Bld: 144 mg/dL — ABNORMAL HIGH (ref 70–99)
Potassium: 4.2 mmol/L (ref 3.5–5.1)
Sodium: 141 mmol/L (ref 135–145)

## 2022-07-06 LAB — GLUCOSE, CAPILLARY: Glucose-Capillary: 127 mg/dL — ABNORMAL HIGH (ref 70–99)

## 2022-07-06 SURGERY — RIGHT HEART CATH
Anesthesia: LOCAL

## 2022-07-06 MED ORDER — SODIUM CHLORIDE 0.9 % IV SOLN: INTRAVENOUS | Status: DC

## 2022-07-06 MED ORDER — LIDOCAINE HCL (PF) 1 % IJ SOLN
INTRAMUSCULAR | Status: DC | PRN
  Administered 2022-07-06: 2 mL

## 2022-07-06 MED ORDER — SODIUM CHLORIDE 0.9 % IV SOLN: 250.0000 mL | INTRAVENOUS | Status: DC | PRN

## 2022-07-06 MED ORDER — SODIUM CHLORIDE 0.9% FLUSH: 3.0000 mL | INTRAVENOUS | Status: DC | PRN

## 2022-07-06 MED ORDER — HYDRALAZINE HCL 20 MG/ML IJ SOLN: 10.0000 mg | INTRAMUSCULAR | Status: DC | PRN

## 2022-07-06 MED ORDER — ONDANSETRON HCL 4 MG/2ML IJ SOLN: 4.0000 mg | Freq: Four times a day (QID) | INTRAMUSCULAR | Status: DC | PRN

## 2022-07-06 MED ORDER — HEPARIN (PORCINE) IN NACL 1000-0.9 UT/500ML-% IV SOLN
INTRAVENOUS | Status: AC
  Filled 2022-07-06: qty 500

## 2022-07-06 MED ORDER — ACETAMINOPHEN 325 MG PO TABS: 650.0000 mg | ORAL_TABLET | ORAL | Status: DC | PRN

## 2022-07-06 MED ORDER — SODIUM CHLORIDE 0.9% FLUSH: 3.0000 mL | Freq: Two times a day (BID) | INTRAVENOUS | Status: DC

## 2022-07-06 MED ORDER — HEPARIN (PORCINE) IN NACL 1000-0.9 UT/500ML-% IV SOLN
INTRAVENOUS | Status: DC | PRN
  Administered 2022-07-06: 500 mL

## 2022-07-06 MED ORDER — LIDOCAINE HCL (PF) 1 % IJ SOLN
INTRAMUSCULAR | Status: AC
  Filled 2022-07-06: qty 30

## 2022-07-06 MED ORDER — LABETALOL HCL 5 MG/ML IV SOLN: 10.0000 mg | INTRAVENOUS | Status: DC | PRN

## 2022-07-06 SURGICAL SUPPLY — 6 items
CATH BALLN WEDGE 5F 110CM (CATHETERS) IMPLANT
GUIDEWIRE .025 260CM (WIRE) IMPLANT
KIT HEART LEFT (KITS) ×1 IMPLANT
PACK CARDIAC CATHETERIZATION (CUSTOM PROCEDURE TRAY) ×1 IMPLANT
SHEATH GLIDE SLENDER 4/5FR (SHEATH) IMPLANT
TRANSDUCER W/STOPCOCK (MISCELLANEOUS) ×1 IMPLANT

## 2022-07-06 NOTE — Interval H&P Note (Signed)
History and Physical Interval Note:  07/06/2022 10:10 AM  Keith [name]  has presented today for surgery, with the diagnosis of heart failure.  The various methods of treatment have been discussed with the patient and family. After consideration of risks, benefits and other options for treatment, the patient has consented to  Procedure(s): RIGHT HEART CATH (N/A) as a surgical intervention.  The patient's history has been reviewed, patient examined, no change in status, stable for surgery.  I have reviewed the patient's chart and labs.  Questions were answered to the patient's satisfaction.     Labrandon Knoch [employer]

## 2022-07-06 NOTE — Discharge Instructions (Addendum)
May resume Eliquis this afternoon as he normally takes it.   Activity Rest as told by your health care provider. Return to your normal activities as told by your health care provider. Ask your health care provider what activities are safe for you. May shower and remove bandage 24 hours after discharge. If you were given a sedative during the procedure, it can affect you for several hours. Do not drive or operate machinery until your health care provider says that it is safe. General instructions      Check your IV insertion area every day for signs of infection. Check for: Redness, swelling, or pain. Fluid or blood. Warmth. Pus or a bad smell. Take over-the-counter and prescription medicines only as told by your health care provider. Contact a health care provider if: You have redness, swelling, warmth, pus, or pain around the IV insertion site.

## 2022-07-06 NOTE — Telephone Encounter (Signed)
Advanced Heart Failure Patient Advocate Encounter  Prior Authorization for Adempas has been approved.    PA# [id number] Effective dates: 07/06/22 through 10/07/22  Audry Riles, PharmD, BCPS, BCCP, CPP Heart Failure Clinic Pharmacist [phone]

## 2022-07-06 NOTE — Telephone Encounter (Signed)
Patient Advocate Encounter   Received notification from [employer] that prior authorization for Adempas is required.   PA submitted on CoverMyMeds Key [id number] Status is pending   Will continue to follow.   Audry Riles, PharmD, BCPS, BCCP, CPP Heart Failure Clinic Pharmacist [phone]

## 2022-07-07 DIAGNOSIS — R29898 Other symptoms and signs involving the musculoskeletal system: Secondary | ICD-10-CM | POA: Diagnosis not present

## 2022-07-07 DIAGNOSIS — R748 Abnormal levels of other serum enzymes: Secondary | ICD-10-CM | POA: Diagnosis not present

## 2022-07-07 DIAGNOSIS — R079 Chest pain, unspecified: Secondary | ICD-10-CM | POA: Diagnosis not present

## 2022-07-09 ENCOUNTER — Encounter (HOSPITAL_COMMUNITY): Payer: Self-pay | Admitting: Cardiology

## 2022-07-11 ENCOUNTER — Telehealth (HOSPITAL_COMMUNITY): Payer: Self-pay | Admitting: Pharmacy Technician

## 2022-07-11 ENCOUNTER — Other Ambulatory Visit (HOSPITAL_COMMUNITY): Payer: Self-pay

## 2022-07-11 DIAGNOSIS — M48061 Spinal stenosis, lumbar region without neurogenic claudication: Secondary | ICD-10-CM | POA: Diagnosis not present

## 2022-07-11 DIAGNOSIS — Z9981 Dependence on supplemental oxygen: Secondary | ICD-10-CM | POA: Diagnosis not present

## 2022-07-11 DIAGNOSIS — M419 Scoliosis, unspecified: Secondary | ICD-10-CM | POA: Diagnosis not present

## 2022-07-11 DIAGNOSIS — E1122 Type 2 diabetes mellitus with diabetic chronic kidney disease: Secondary | ICD-10-CM | POA: Diagnosis not present

## 2022-07-11 DIAGNOSIS — M5459 Other low back pain: Secondary | ICD-10-CM | POA: Diagnosis not present

## 2022-07-11 DIAGNOSIS — M1990 Unspecified osteoarthritis, unspecified site: Secondary | ICD-10-CM | POA: Diagnosis not present

## 2022-07-11 DIAGNOSIS — N1831 Chronic kidney disease, stage 3a: Secondary | ICD-10-CM | POA: Diagnosis not present

## 2022-07-11 DIAGNOSIS — E785 Hyperlipidemia, unspecified: Secondary | ICD-10-CM | POA: Diagnosis not present

## 2022-07-11 DIAGNOSIS — I1310 Hypertensive heart and chronic kidney disease without heart failure, with stage 1 through stage 4 chronic kidney disease, or unspecified chronic kidney disease: Secondary | ICD-10-CM | POA: Diagnosis not present

## 2022-07-11 NOTE — Telephone Encounter (Signed)
Advanced Heart Failure Patient Advocate Encounter  Sent patient docusign link for Adempas PSMN and [employer] patient assistance.

## 2022-07-12 ENCOUNTER — Telehealth (HOSPITAL_COMMUNITY): Payer: Self-pay | Admitting: Pharmacy Technician

## 2022-07-12 NOTE — Telephone Encounter (Signed)
Advanced Heart Failure Patient Advocate Encounter  The patient was approved for a [place] (TAF) grant that will help cover the cost of Adempas regardless of cost. Eligibility, 10/08/21 - 10/08/23.  ID [id number]  BIN [id number]  PCN [id number]  Group [id number]

## 2022-07-17 ENCOUNTER — Telehealth: Payer: Self-pay | Admitting: Family Medicine

## 2022-07-17 DIAGNOSIS — N1831 Chronic kidney disease, stage 3a: Secondary | ICD-10-CM | POA: Diagnosis not present

## 2022-07-17 DIAGNOSIS — M5459 Other low back pain: Secondary | ICD-10-CM | POA: Diagnosis not present

## 2022-07-17 DIAGNOSIS — E785 Hyperlipidemia, unspecified: Secondary | ICD-10-CM | POA: Diagnosis not present

## 2022-07-17 DIAGNOSIS — E1122 Type 2 diabetes mellitus with diabetic chronic kidney disease: Secondary | ICD-10-CM | POA: Diagnosis not present

## 2022-07-17 DIAGNOSIS — M1990 Unspecified osteoarthritis, unspecified site: Secondary | ICD-10-CM | POA: Diagnosis not present

## 2022-07-17 DIAGNOSIS — I1310 Hypertensive heart and chronic kidney disease without heart failure, with stage 1 through stage 4 chronic kidney disease, or unspecified chronic kidney disease: Secondary | ICD-10-CM | POA: Diagnosis not present

## 2022-07-17 DIAGNOSIS — M419 Scoliosis, unspecified: Secondary | ICD-10-CM | POA: Diagnosis not present

## 2022-07-17 DIAGNOSIS — Z9981 Dependence on supplemental oxygen: Secondary | ICD-10-CM | POA: Diagnosis not present

## 2022-07-17 DIAGNOSIS — M48061 Spinal stenosis, lumbar region without neurogenic claudication: Secondary | ICD-10-CM | POA: Diagnosis not present

## 2022-07-17 NOTE — Progress Notes (Signed)
HPI: Keith [name] is a 71 y.o. male with hx of DM II,CHF,HLD,CKD III, chronic anticoagulation,and lumbar stenosis here today for 6 months follow up.  Last seen on 06/06/2022.  DM II: Dx'ed 8+ years ago. He is on non pharmacologic treatment.  He has not been checking his blood sugars lately. His last A1c was 6.6 in 01/2022. Negative for polydipsia,polyuria, or polyphagia. No foot ulcer or rash. + Peripheral neuropathy on Gabapentin 300 mg bid and Duloxetine 60 mg daily.  Lab Results  Component Value Date   MICROALBUR 11.3 (H) 01/17/2022   MICROALBUR 8.1 (H) 03/30/2020   He reports that he did not take his Torsemide for two days, which resulted in significant swelling in his feet, he was also traveling, sitting in the car for over five hours. Improved since resuming diuretic.  HTN: He states that his blood pressure readings at home are similar to today's reading. The patient denies experiencing any unusual headaches, chest pain, difficulty breathing, or palpitations. He acknowledges that he has not been to the dentist in a while and needs to schedule an appointment.  Lab Results  Component Value Date   CREATININE 1.42 (H) 07/06/2022   BUN 21 07/06/2022   NA 144 07/06/2022   NA 144 07/06/2022   K 4.2 07/06/2022   K 4.0 07/06/2022   CL 107 07/06/2022   CO2 27 07/06/2022   He had a cardiac catheterization 07/06/22.  He has been taking Torsemide 20 mg daily, spironolactone 25 mg (half tablet), Carvedilol 12.5 mg twice daily, Amlodipine 10 mg daily.  He is currently undergoing physical therapy for mobility, progressively getting better. He is still trying to follow a healthful diet and feels like he is not losing wt as he would like to.   RLS: He has been taking Ropinirole 1 mg daily at bedtime, which has been helping with leg discomfort and therefore with sleep.   Iron def anemia: He is on Fe Sulfate 325 mg daily, tolerating medication well. He has daily bowel movements,  hx of constipation.  Lab Results  Component Value Date   WBC 5.5 07/06/2022   HGB 11.6 (L) 07/06/2022   HGB 11.2 (L) 07/06/2022   HCT 34.0 (L) 07/06/2022   HCT 33.0 (L) 07/06/2022   MCV 97.4 07/06/2022   PLT 219 07/06/2022   Review of Systems  Constitutional:  Negative for activity change, appetite change and fever.  HENT:  Negative for mouth sores, nosebleeds and sore throat.   Respiratory:  Negative for cough and wheezing.   Gastrointestinal:  Negative for abdominal pain, nausea and vomiting.  Genitourinary:  Negative for decreased urine volume, dysuria and hematuria.  Musculoskeletal:  Positive for arthralgias, back pain and gait problem.  Neurological:  Negative for syncope, facial asymmetry and headaches.  Rest of ROS see pertinent positives and negatives in HPI.  Current Outpatient Medications on File Prior to Visit  Medication Sig Dispense Refill   acetaminophen (TYLENOL) 325 MG tablet Take 1-2 tablets (325-650 mg total) by mouth every 4 (four) hours as needed for mild pain.     amLODipine (NORVASC) 10 MG tablet Take 1 tablet (10 mg total) by mouth daily. 90 tablet 1   apixaban (ELIQUIS) 5 MG TABS tablet Take 1 tablet (5 mg total) by mouth 2 (two) times daily. 60 tablet 11   Blood Glucose Monitoring Suppl (ACCU-CHEK GUIDE ME) w/Device KIT USE TO TEST BLOOD SUGARS 1-2 TIMES DAILY. 1 kit 0   carvedilol (COREG) 12.5 MG tablet Take  1 tablet (12.5 mg total) by mouth 2 (two) times daily with a meal. 180 tablet 3   cyanocobalamin (VITAMIN B12) 1000 MCG tablet Take 1,000 mcg by mouth daily.     DULoxetine (CYMBALTA) 60 MG capsule TAKE 1 CAPSULE BY MOUTH EVERY DAY 90 capsule 2   gabapentin (NEURONTIN) 300 MG capsule Take 1 capsule (300 mg total) by mouth 2 (two) times daily. 60 capsule 3   HYDROcodone-acetaminophen (NORCO) 10-325 MG tablet Take 1 tablet by mouth in the morning, at noon, and at bedtime.     hydrocortisone (ANUSOL-HC) 2.5 % rectal cream Place 1 application. rectally as  needed for hemorrhoids or anal itching.     levETIRAcetam (KEPPRA) 250 MG tablet TAKE 1 TABLET BY MOUTH TWICE A DAY 180 tablet 1   lidocaine (LIDODERM) 5 % Place 1 patch onto the skin daily as needed (pain). Remove & Discard patch within 12 hours or as directed by MD     naloxone (NARCAN) nasal spray 4 mg/0.1 mL Place 1 spray into the nose as needed (accidental overdose).     polyethylene glycol (MIRALAX / GLYCOLAX) 17 g packet Take 17 g by mouth daily as needed for mild constipation.     rosuvastatin (CRESTOR) 20 MG tablet Take 1 tablet (20 mg total) by mouth daily. 90 tablet 3   spironolactone (ALDACTONE) 25 MG tablet Take 0.5 tablets (12.5 mg total) by mouth daily. 45 tablet 3   torsemide (DEMADEX) 20 MG tablet Take 1 tablet (20 mg total) by mouth daily. 90 tablet 3   traZODone (DESYREL) 50 MG tablet TAKE 1/2 TO 1 TABLET BY MOUTH AT BEDTIME AS NEEDED FOR SLEEP (Patient taking differently: Take 25 mg by mouth at bedtime as needed for sleep. for sleep) 90 tablet 1   No current facility-administered medications on file prior to visit.   Past Medical History:  Diagnosis Date   Allergy    Chronic back pain    Diabetes mellitus without complication ([place])    DNR (do not resuscitate) 11/19/2020   Duodenal ulcer hemorrhage 08/29/2014   ED (erectile dysfunction)    Esophageal stricture 08/30/2014   Glucose intolerance (impaired glucose tolerance)    Hiatal hernia 08/30/2014   Hypertension    Hypokalemia 04/11/2013   MRSA carrier 08/30/2014   Obesity    Osteoarthritis    Pneumonia    Pulmonary embolism ([place]) 06/2020   Scoliosis 2016   Spinal stenosis of lumbar region    Urinary tract infection 04/11/2013   Allergies  Allergen Reactions   Aspirin Other (See Comments)    Irritates the stomach and the patient developed ulcers, also   Lisinopril Other (See Comments)    Caused a body ache   Social History   Socioeconomic History   Marital status: Married    Spouse name: Not on file    Number of children: Not on file   Years of education: Not on file   Highest education level: Not on file  Occupational History   Occupation: retired  Tobacco Use   Smoking status: Former    Packs/day: 1.00    Years: 25.00    Total pack years: 25.00    Types: Cigarettes    Quit date: 1995    Years since quitting: 28.7   Smokeless tobacco: Never  Vaping Use   Vaping Use: Never used  Substance and Sexual Activity   Alcohol use: Not Currently    Comment: holidays and special occasions   Drug use: Yes  Types: Oxycodone   Sexual activity: Yes    Birth control/protection: None  Other Topics Concern   Not on file  Social History Narrative   Married.     Social Determinants of Health   Financial Resource Strain: Low Risk  (03/06/2022)   Overall Financial Resource Strain (CARDIA)    Difficulty of Paying Living Expenses: Not hard at all  Food Insecurity: No Food Insecurity (05/25/2022)   Hunger Vital Sign    Worried About Running Out of Food in the Last Year: Never true    Ran Out of Food in the Last Year: Never true  Transportation Needs: No Transportation Needs (05/25/2022)   PRAPARE - Hydrologist (Medical): No    Lack of Transportation (Non-Medical): No  Physical Activity: Sufficiently Active (01/09/2022)   Exercise Vital Sign    Days of Exercise per Week: 7 days    Minutes of Exercise per Session: 30 min  Stress: No Stress Concern Present (01/09/2022)   [place]    Feeling of Stress : Not at all  Social Connections: Moderately Integrated (01/03/2021)   Social Connection and Isolation Panel [NHANES]    Frequency of Communication with Friends and Family: Three times a week    Frequency of Social Gatherings with Friends and Family: Three times a week    Attends Religious Services: More than 4 times per year    Active Member of Clubs or Organizations: No    Attends Theatre manager Meetings: Never    Marital Status: Married   Vitals:   07/18/22 1105  BP: 120/88  Pulse: 82  Resp: 16  Temp: 97.7 F (36.5 C)  SpO2: 96%   Wt Readings from Last 3 Encounters:  07/06/22 278 lb (126.1 kg)  06/25/22 293 lb 6.4 oz (133.1 kg)  06/06/22 278 lb (126.1 kg)  Body mass index is 37.7 kg/m.  Physical Exam Nursing note reviewed.  Constitutional:      General: He is not in acute distress.    Appearance: He is well-developed.  HENT:     Head: Normocephalic and atraumatic.     Mouth/Throat:     Mouth: Mucous membranes are moist.  Eyes:     Conjunctiva/sclera: Conjunctivae normal.  Cardiovascular:     Rate and Rhythm: Normal rate and regular rhythm.     Heart sounds: No murmur heard.    Comments: PT pulses palpable. Pulmonary:     Effort: Pulmonary effort is normal. No respiratory distress.     Breath sounds: Normal breath sounds.  Abdominal:     Palpations: Abdomen is soft. There is no hepatomegaly or mass.     Tenderness: There is no abdominal tenderness.  Musculoskeletal:     Right lower leg: 2+ Pitting Edema present.     Left lower leg: 2+ Pitting Edema present.  Skin:    General: Skin is warm.     Findings: No erythema or rash.  Neurological:     Mental Status: He is alert and oriented to person, place, and time.     Cranial Nerves: No cranial nerve deficit.     Comments: He is in a wheel chair  Psychiatric:     Comments: Well groomed, good eye contact.   ASSESSMENT AND PLAN:  Mr.Damaria was seen today for follow-up.  Diagnoses and all orders for this visit: Orders Placed This Encounter  Procedures   POC HgB A1c   Lab Results  Component Value Date   HGBA1C 6.2 07/18/2022   Type 2 diabetes mellitus with diabetic neuropathy, without long-term current use of insulin ([place]) HgA1C is still at goal, went from 6.6 to 6.2. Continue non pharmacologic treatment. Annual eye exam, periodic dental and foot care recommended. F/U in 5-6  months.  Essential hypertension BP adequately controlled. Continue same dose of Amlodipine,Spironolactone,and Carvedilol. Continue low salt diet and monitoring BP regularly.  Stage 3a chronic kidney disease (HCC) Cr 1.1-1.4 and e GFR 50-60, he has had some in the 30's. Continue adequate hydration, low salt diet,and avoidance of NSAID's. Adequate BP and glucose control.  Nocturnal leg movements Improved. Continue same dose of Requip.  -     rOPINIRole (REQUIP) 1 MG tablet; Take 1 tablet (1 mg total) by mouth at bedtime.  Constipation, unspecified constipation type Stable. Chronic opioid use and Fe sulfate could aggravate problem. Continue adequate hydration and fiber intake.  Iron deficiency anemia, unspecified iron deficiency anemia type Hg has improved from 9.7 to 11.7 Continue ferrous sulfate, consider taking every other day with vitamin C for better absorption  -     ferrous sulfate 325 (65 FE) MG tablet; Take 1 tablet (325 mg total) by mouth every other day.  Preventive Care - Flu shot not taken due to past negative reactions - Schedule eye exam - Continue physical therapy for mobility improvement - Gradual weight loss progress - Schedule dental appointment  Return in about 6 months (around 01/17/2023).  Daiwik Buffalo G. [name], MD  [facility]. [place] office.

## 2022-07-17 NOTE — Telephone Encounter (Signed)
I called and spoke with Katie. She is aware of message below & aware that pt has appointment tomorrow with pcp.

## 2022-07-17 NOTE — Telephone Encounter (Signed)
No changes in current management. Continue monitoring for new symptoms. Compression stockings and lower extremity elevation may help with swelling. BJ

## 2022-07-17 NOTE — Telephone Encounter (Addendum)
Keith [name] with [facility] - Asking for advice, if any.  [place] to leave a detailed message  Calling to report Pt has swelling in both feet Pt did no take his torsemide Saturday or Sunday because he was out of town and on a long rode trip.   Vitals are within limits  Cannot be weighed because of weight and balance.

## 2022-07-18 ENCOUNTER — Encounter: Payer: Self-pay | Admitting: Family Medicine

## 2022-07-18 ENCOUNTER — Ambulatory Visit (INDEPENDENT_AMBULATORY_CARE_PROVIDER_SITE_OTHER): Payer: Medicare Other | Admitting: Family Medicine

## 2022-07-18 VITALS — BP 120/88 | HR 82 | Temp 97.7°F | Resp 16 | Ht 72.0 in

## 2022-07-18 DIAGNOSIS — K5900 Constipation, unspecified: Secondary | ICD-10-CM

## 2022-07-18 DIAGNOSIS — N1831 Chronic kidney disease, stage 3a: Secondary | ICD-10-CM

## 2022-07-18 DIAGNOSIS — E1140 Type 2 diabetes mellitus with diabetic neuropathy, unspecified: Secondary | ICD-10-CM

## 2022-07-18 DIAGNOSIS — D509 Iron deficiency anemia, unspecified: Secondary | ICD-10-CM | POA: Diagnosis not present

## 2022-07-18 DIAGNOSIS — I10 Essential (primary) hypertension: Secondary | ICD-10-CM | POA: Diagnosis not present

## 2022-07-18 DIAGNOSIS — R258 Other abnormal involuntary movements: Secondary | ICD-10-CM

## 2022-07-18 LAB — POCT GLYCOSYLATED HEMOGLOBIN (HGB A1C): HbA1c, POC (prediabetic range): 6.2 % (ref 5.7–6.4)

## 2022-07-18 MED ORDER — FERROUS SULFATE 325 (65 FE) MG PO TABS: 325.0000 mg | ORAL_TABLET | ORAL | 3 refills | Status: DC

## 2022-07-18 MED ORDER — ROPINIROLE HCL 1 MG PO TABS: 1.0000 mg | ORAL_TABLET | Freq: Every day | ORAL | 2 refills | Status: DC

## 2022-07-18 NOTE — Patient Instructions (Addendum)
A few things to remember from today's visit:   Type 2 diabetes mellitus with diabetic neuropathy, without long-term current use of insulin ([place]) - Plan: POC HgB A1c  Essential hypertension  Stage 3a chronic kidney disease (HCC)  Nocturnal leg movements - Plan: rOPINIRole (REQUIP) 1 MG tablet  Constipation, unspecified constipation type  Based on our discussion, please follow these instructions:  1. Continue to monitor your blood sugar levels as needed and maintain a healthy diet to manage your diabetes. 2. Take your Torsemide daily as prescribed by Dr. Aundra Dubin for your heart health. 3. Remember to stop and stretch every two hours during long travels to reduce the risk of blood clots/swelling. 4. Schedule an eye exam and a dental appointment as soon as possible. 5. Continue with your physical therapy and exercises as tolerated to improve your mobility. 6. Refill your Ropinirole prescription for restless legs syndrome was sent. 7. Take ferrous sulfate every other day with vitamin C to help with absorption and manage your anemia. 8. Stay adequately hydrated to balance your heart and kidney health, but avoid excessive fluid intake.  Keep eye exam appt.  If you need refills for medications you take chronically, please call your pharmacy. Do not use My Chart to request refills or for acute issues that need immediate attention. If you send a my chart message, it may take a few days to be addressed, specially if I am not in the office.  Please be sure medication list is accurate. If a new problem present, please set up appointment sooner than planned today.

## 2022-07-18 NOTE — Telephone Encounter (Addendum)
Advanced Heart Failure Patient Advocate Encounter  Sent in Adempas PSMN, [place] approval information and TAF grant via fax. Will not seek approval of [employer] patient assistance while grant is active.   Document scanned to chart.

## 2022-07-20 DIAGNOSIS — Z9981 Dependence on supplemental oxygen: Secondary | ICD-10-CM | POA: Diagnosis not present

## 2022-07-20 DIAGNOSIS — I1310 Hypertensive heart and chronic kidney disease without heart failure, with stage 1 through stage 4 chronic kidney disease, or unspecified chronic kidney disease: Secondary | ICD-10-CM | POA: Diagnosis not present

## 2022-07-20 DIAGNOSIS — M5459 Other low back pain: Secondary | ICD-10-CM | POA: Diagnosis not present

## 2022-07-20 DIAGNOSIS — E1122 Type 2 diabetes mellitus with diabetic chronic kidney disease: Secondary | ICD-10-CM | POA: Diagnosis not present

## 2022-07-20 DIAGNOSIS — M419 Scoliosis, unspecified: Secondary | ICD-10-CM | POA: Diagnosis not present

## 2022-07-20 DIAGNOSIS — M1990 Unspecified osteoarthritis, unspecified site: Secondary | ICD-10-CM | POA: Diagnosis not present

## 2022-07-20 DIAGNOSIS — M48061 Spinal stenosis, lumbar region without neurogenic claudication: Secondary | ICD-10-CM | POA: Diagnosis not present

## 2022-07-20 DIAGNOSIS — E785 Hyperlipidemia, unspecified: Secondary | ICD-10-CM | POA: Diagnosis not present

## 2022-07-20 DIAGNOSIS — N1831 Chronic kidney disease, stage 3a: Secondary | ICD-10-CM | POA: Diagnosis not present

## 2022-07-23 ENCOUNTER — Other Ambulatory Visit: Payer: Self-pay | Admitting: Cardiology

## 2022-07-24 DIAGNOSIS — M1990 Unspecified osteoarthritis, unspecified site: Secondary | ICD-10-CM | POA: Diagnosis not present

## 2022-07-24 DIAGNOSIS — M5459 Other low back pain: Secondary | ICD-10-CM | POA: Diagnosis not present

## 2022-07-24 DIAGNOSIS — M48061 Spinal stenosis, lumbar region without neurogenic claudication: Secondary | ICD-10-CM | POA: Diagnosis not present

## 2022-07-24 DIAGNOSIS — M419 Scoliosis, unspecified: Secondary | ICD-10-CM | POA: Diagnosis not present

## 2022-07-24 DIAGNOSIS — E785 Hyperlipidemia, unspecified: Secondary | ICD-10-CM | POA: Diagnosis not present

## 2022-07-24 DIAGNOSIS — N1831 Chronic kidney disease, stage 3a: Secondary | ICD-10-CM | POA: Diagnosis not present

## 2022-07-24 DIAGNOSIS — E1122 Type 2 diabetes mellitus with diabetic chronic kidney disease: Secondary | ICD-10-CM | POA: Diagnosis not present

## 2022-07-24 DIAGNOSIS — I1310 Hypertensive heart and chronic kidney disease without heart failure, with stage 1 through stage 4 chronic kidney disease, or unspecified chronic kidney disease: Secondary | ICD-10-CM | POA: Diagnosis not present

## 2022-07-24 DIAGNOSIS — Z9981 Dependence on supplemental oxygen: Secondary | ICD-10-CM | POA: Diagnosis not present

## 2022-07-26 ENCOUNTER — Ambulatory Visit (HOSPITAL_COMMUNITY)
Admission: RE | Admit: 2022-07-26 | Discharge: 2022-07-26 | Disposition: A | Discharge status: Home / Self Care | Payer: Medicare Other | Source: Ambulatory Visit | Attending: Cardiology | Admitting: Cardiology

## 2022-07-26 VITALS — BP 130/80 | HR 91 | Wt 286.4 lb

## 2022-07-26 DIAGNOSIS — Z79899 Other long term (current) drug therapy: Secondary | ICD-10-CM | POA: Diagnosis not present

## 2022-07-26 DIAGNOSIS — M4800 Spinal stenosis, site unspecified: Secondary | ICD-10-CM | POA: Diagnosis not present

## 2022-07-26 DIAGNOSIS — I272 Pulmonary hypertension, unspecified: Secondary | ICD-10-CM

## 2022-07-26 DIAGNOSIS — E1122 Type 2 diabetes mellitus with diabetic chronic kidney disease: Secondary | ICD-10-CM | POA: Diagnosis not present

## 2022-07-26 DIAGNOSIS — R0683 Snoring: Secondary | ICD-10-CM | POA: Diagnosis not present

## 2022-07-26 DIAGNOSIS — Z86718 Personal history of other venous thrombosis and embolism: Secondary | ICD-10-CM | POA: Insufficient documentation

## 2022-07-26 DIAGNOSIS — Z6838 Body mass index (BMI) 38.0-38.9, adult: Secondary | ICD-10-CM | POA: Insufficient documentation

## 2022-07-26 DIAGNOSIS — Z9981 Dependence on supplemental oxygen: Secondary | ICD-10-CM | POA: Diagnosis not present

## 2022-07-26 DIAGNOSIS — Z86711 Personal history of pulmonary embolism: Secondary | ICD-10-CM | POA: Insufficient documentation

## 2022-07-26 DIAGNOSIS — Z86018 Personal history of other benign neoplasm: Secondary | ICD-10-CM | POA: Insufficient documentation

## 2022-07-26 DIAGNOSIS — I130 Hypertensive heart and chronic kidney disease with heart failure and stage 1 through stage 4 chronic kidney disease, or unspecified chronic kidney disease: Secondary | ICD-10-CM | POA: Diagnosis not present

## 2022-07-26 DIAGNOSIS — I2721 Secondary pulmonary arterial hypertension: Secondary | ICD-10-CM | POA: Insufficient documentation

## 2022-07-26 DIAGNOSIS — I5032 Chronic diastolic (congestive) heart failure: Secondary | ICD-10-CM | POA: Diagnosis not present

## 2022-07-26 DIAGNOSIS — N183 Chronic kidney disease, stage 3 unspecified: Secondary | ICD-10-CM | POA: Diagnosis not present

## 2022-07-26 DIAGNOSIS — E669 Obesity, unspecified: Secondary | ICD-10-CM | POA: Diagnosis not present

## 2022-07-26 LAB — BASIC METABOLIC PANEL
Anion gap: 8 (ref 5–15)
BUN: 22 mg/dL (ref 8–23)
CO2: 28 mmol/L (ref 22–32)
Calcium: 10.1 mg/dL (ref 8.9–10.3)
Chloride: 109 mmol/L (ref 98–111)
Creatinine, Ser: 1.44 mg/dL — ABNORMAL HIGH (ref 0.61–1.24)
GFR, Estimated: 52 mL/min — ABNORMAL LOW (ref >60)
Glucose, Bld: 132 mg/dL — ABNORMAL HIGH (ref 70–99)
Potassium: 4.6 mmol/L (ref 3.5–5.1)
Sodium: 145 mmol/L (ref 135–145)

## 2022-07-26 LAB — BRAIN NATRIURETIC PEPTIDE: B Natriuretic Peptide: 16 pg/mL (ref 0.0–100.0)

## 2022-07-26 MED ORDER — SPIRONOLACTONE 25 MG PO TABS: 25.0000 mg | ORAL_TABLET | Freq: Every day | ORAL | 3 refills | Status: DC

## 2022-07-26 MED ORDER — TORSEMIDE 20 MG PO TABS: 30.0000 mg | ORAL_TABLET | Freq: Every day | ORAL | 3 refills | Status: DC

## 2022-07-26 NOTE — Progress Notes (Signed)
Height:     Weight: BMI:  Today's Date:  STOP BANG RISK ASSESSMENT S (snore) Have you been told that you snore?     YES   T (tired) Are you often tired, fatigued, or sleepy during the day?   YES  O (obstruction) Do you stop breathing, choke, or gasp during sleep? NO   P (pressure) Do you have or are you being treated for high blood pressure? YES   B (BMI) Is your body index greater than 35 kg/m? YES   A (age) Are you 70 years old or older? YES   N (neck) Do you have a neck circumference greater than 16 inches?   NO   G (gender) Are you a male? YES   TOTAL STOP/BANG "YES" ANSWERS 6                                                                       For Office Use Only              Procedure Order Form    YES to 3+ Stop Bang questions OR two clinical symptoms - patient qualifies for WatchPAT (CPT 95800)      Clinical Notes: Will consult Sleep Specialist and refer for management of therapy due to patient increased risk of Sleep Apnea. Ordering a sleep study due to the following two clinical symptoms: Excessive daytime sleepiness G47.10 / Gastroesophageal reflux K21.9 / Nocturia R35.1 / Morning Headaches G44.221 / Difficulty concentrating R41.840 / Memory problems or poor judgment G31.84 / Personality changes or irritability R45.4 / Loud snoring R06.83 / Depression F32.9 / Unrefreshed by sleep G47.8 / Impotence N52.9 / History of high blood pressure R03.0 / Insomnia G47.00

## 2022-07-26 NOTE — Progress Notes (Signed)
ADVANCED HF CLINIC CONSULT NOTE  Primary Care: [name], [name], MD HF Cardiologist: Dr. Aundra Dubin   HPI: Keith [name] is a 71 y.o.male with history saddle PE/LLE DVT 93/73 s/p TPA complicated by hypotension requiring Levophed, multiple prior back surgeries, left adrenal adenoma, DM II, obesity, HTN.   Admitted 03/01/22 with CP. He was hypoxic requiring supplemental oxygen. V/Q scan with moderate to high probability of PE.  Lifelong DOAC recommended, started on Eliquis 5 BID (had been off eliquis since 02/22).   Echo in 5/23 showed EF 55-60%, mild LVH, interventricular septum flattened in systole and diastole consistent with RV pressure and volume overload, RV severely enlarged with severely reduced function, RVSP 75 mmH, moderate to severe TR.   RHC/LHC in 5/23 showed no CAD, elevated right and left heart filling pressures, and pulmonary venous hypertension.  He was seen by CT surgery. Felt to be high risk for pulmonary embolectomy. Course complicated by AKI, Scr peaked at 3.34 and improved to 1.60 (baseline). Started on torsemide 20 mg M, W, F at discharge. Atenolol/HTCZ stopped.   Echo in 9/23 showed EF 55-60%, mildly decreased RV function with mild-moderate RV enlargement, unable to estimate PA systolic pressure, IVC normal. RHC was done in 10/23, showing moderate pulmonary arterial hypertension with PVR 5 WU.    Patient returns for followup of RV failure.  He walks very little due to spinal stenosis, not CHF.  He is primarily in the wheelchair. He walks with PT and a walker at home.  He has home oxygen but is only using it at night.  He snores and is sleepy during the day.  No dyspnea when he works with PT.  No chest pain.  No lightheadedness.   Labs (6/23): K 4.0, creatinine 1.28 Labs (7/23): K 4.1, creatinine 1.69, BNP 20 Labs (9/23): K 4.2, creatinine 1.42, BNP 33  ECG (personally reviewed): NSR 1st degree AVB, LAFB, poor RWP  PMH: 1. Type 2 diabetes 2. Left adrenal adenoma 3.  Obesity 4. HTN 5. H/o back surgery for spinal stenosis: Has had 3 surgeries, actually could not walk after the third surgery but has been doing PT.  6. Venous thromboembolism: Saddle PE in 9/21 with LLE DVT, this was treated with TPA.   - Recurrent PE in 5/23 after stopping DOAC.  - Echo (5/23): EF 55-60%, mild LVH, interventricular septum flattened in systole and diastole consistent with RV pressure and volume overload, RV severely enlarged with severely reduced function, RVSP 75 mmH, moderate to severe TR.  - R/LHC (5/23): RA 12, PA 48/34 (39), PCWP mean 40, V wave 42, Fick CO/CI 5.93/2.33, PVR ?< 1 WU.  - Echo (9/23): EF 55-60%, mildly decreased RV function with mild-moderate RV enlargement, unable to estimate PA systolic pressure, IVC normal.  - RHC (10/23): mean RA 9, PA 60/30 mean 36, mean PCWP 6, CI 2.46, PVR 5 WU 7. CKD stage 3  Review of Systems: All systems reviewed and negative except as per HPI.    Current Outpatient Medications  Medication Sig Dispense Refill   acetaminophen (TYLENOL) 325 MG tablet Take 1-2 tablets (325-650 mg total) by mouth every 4 (four) hours as needed for mild pain.     amLODipine (NORVASC) 10 MG tablet Take 1 tablet (10 mg total) by mouth daily. 90 tablet 1   apixaban (ELIQUIS) 5 MG TABS tablet Take 1 tablet (5 mg total) by mouth 2 (two) times daily. 60 tablet 11   Blood Glucose Monitoring Suppl (ACCU-CHEK GUIDE ME) w/Device KIT  USE TO TEST BLOOD SUGARS 1-2 TIMES DAILY. 1 kit 0   carvedilol (COREG) 12.5 MG tablet Take 1 tablet (12.5 mg total) by mouth 2 (two) times daily with a meal. 180 tablet 3   cyanocobalamin (VITAMIN B12) 1000 MCG tablet Take 1,000 mcg by mouth daily.     DULoxetine (CYMBALTA) 60 MG capsule TAKE 1 CAPSULE BY MOUTH EVERY DAY 90 capsule 2   ferrous sulfate 325 (65 FE) MG tablet Take 1 tablet (325 mg total) by mouth every other day. 30 tablet 3   gabapentin (NEURONTIN) 300 MG capsule Take 1 capsule (300 mg total) by mouth 2 (two) times  daily. 60 capsule 3   HYDROcodone-acetaminophen (NORCO) 10-325 MG tablet Take 1 tablet by mouth in the morning, at noon, and at bedtime.     hydrocortisone (ANUSOL-HC) 2.5 % rectal cream Place 1 application. rectally as needed for hemorrhoids or anal itching.     levETIRAcetam (KEPPRA) 250 MG tablet TAKE 1 TABLET BY MOUTH TWICE A DAY 180 tablet 1   lidocaine (LIDODERM) 5 % Place 1 patch onto the skin daily as needed (pain). Remove & Discard patch within 12 hours or as directed by MD     naloxone (NARCAN) nasal spray 4 mg/0.1 mL Place 1 spray into the nose as needed (accidental overdose).     polyethylene glycol (MIRALAX / GLYCOLAX) 17 g packet Take 17 g by mouth daily as needed for mild constipation.     rOPINIRole (REQUIP) 1 MG tablet Take 1 tablet (1 mg total) by mouth at bedtime. 90 tablet 2   rosuvastatin (CRESTOR) 20 MG tablet Take 1 tablet (20 mg total) by mouth daily. 90 tablet 3   traZODone (DESYREL) 50 MG tablet TAKE 1/2 TO 1 TABLET BY MOUTH AT BEDTIME AS NEEDED FOR SLEEP (Patient taking differently: Take 25 mg by mouth at bedtime as needed for sleep. for sleep) 90 tablet 1   spironolactone (ALDACTONE) 25 MG tablet Take 1 tablet (25 mg total) by mouth daily. 90 tablet 3   torsemide (DEMADEX) 20 MG tablet Take 1.5 tablets (30 mg total) by mouth daily. 90 tablet 3   No current facility-administered medications for this encounter.   Allergies  Allergen Reactions   Aspirin Other (See Comments)    Irritates the stomach and the patient developed ulcers, also   Lisinopril Other (See Comments)    Caused a body ache   Social History   Socioeconomic History   Marital status: Married    Spouse name: Not on file   Number of children: Not on file   Years of education: Not on file   Highest education level: Not on file  Occupational History   Occupation: retired  Tobacco Use   Smoking status: Former    Packs/day: 1.00    Years: 25.00    Total pack years: 25.00    Types: Cigarettes     Quit date: 1995    Years since quitting: 28.8   Smokeless tobacco: Never  Vaping Use   Vaping Use: Never used  Substance and Sexual Activity   Alcohol use: Not Currently    Comment: holidays and special occasions   Drug use: Yes    Types: Oxycodone   Sexual activity: Yes    Birth control/protection: None  Other Topics Concern   Not on file  Social History Narrative   Married.     Social Determinants of Health   Financial Resource Strain: Low Risk  (03/06/2022)   Overall Financial Resource  Strain (CARDIA)    Difficulty of Paying Living Expenses: Not hard at all  Food Insecurity: No Food Insecurity (05/25/2022)   Hunger Vital Sign    Worried About Running Out of Food in the Last Year: Never true    Ran Out of Food in the Last Year: Never true  Transportation Needs: No Transportation Needs (05/25/2022)   PRAPARE - Hydrologist (Medical): No    Lack of Transportation (Non-Medical): No  Physical Activity: Sufficiently Active (01/09/2022)   Exercise Vital Sign    Days of Exercise per Week: 7 days    Minutes of Exercise per Session: 30 min  Stress: No Stress Concern Present (01/09/2022)   [place]    Feeling of Stress : Not at all  Social Connections: Moderately Integrated (01/03/2021)   Social Connection and Isolation Panel [NHANES]    Frequency of Communication with Friends and Family: Three times a week    Frequency of Social Gatherings with Friends and Family: Three times a week    Attends Religious Services: More than 4 times per year    Active Member of Clubs or Organizations: No    Attends Archivist Meetings: Never    Marital Status: Married  Human resources officer Violence: Not At Risk (05/25/2022)   Humiliation, Afraid, Rape, and Kick questionnaire    Fear of Current or Ex-Partner: No    Emotionally Abused: No    Physically Abused: No    Sexually Abused: No   Family  History  Problem Relation Age of Onset   Diabetes Mother    Asthma Mother    Hypertension Father    Stroke Father    Colon cancer Neg Hx    BP 130/80   Pulse 91   Wt 129.9 kg (286 lb 6.4 oz)   SpO2 98%   BMI 38.84 kg/m   Wt Readings from Last 3 Encounters:  07/26/22 129.9 kg (286 lb 6.4 oz)  07/06/22 126.1 kg (278 lb)  06/25/22 133.1 kg (293 lb 6.4 oz)   PHYSICAL EXAM: General: NAD Neck: JVP 8-9 cm, no thyromegaly or thyroid nodule.  Lungs: Clear to auscultation bilaterally with normal respiratory effort. CV: Nondisplaced PMI.  Heart regular S1/S2, no S3/S4, no murmur.  1+ ankle edema.  No carotid bruit.  Normal pedal pulses.  Abdomen: Soft, nontender, no hepatosplenomegaly, no distention.  Skin: Intact without lesions or rashes.  Neurologic: Alert and oriented x 3.  Psych: Normal affect. Extremities: No clubbing or cyanosis.  HEENT: Normal.   ASSESSMENT & PLAN: 1.  Chronic diastolic CHF with RV failure:  Suspect RV strain from prior PEs.  Echo in 5/23 showed EF 55-60%, RV severely reduced, RVSP 75 mmHg, moderate to severe TR.  Echo in 9/23 showed EF 55-60%, mildly decreased RV function with mild-moderate RV enlargement, unable to estimate PA systolic pressure, IVC normal.  RHC in 10/23 showed moderate pulmonary arterial hypertension.  On exam today, he still looks mildly volume overloaded though weight is down.  Functional status is complicated by poor mobility due to spinal stenosis.  - Increase torsemide to 30 mg daily with BMET/BNP today and BMET in 10 days.  - Increase spironolactone to 25 mg daily.  - Hold off on SGLT2i with recurrent UTIs.  2. Pulmonary hypertension: RHC in 10/23 showed moderate pulmonary arterial hypertension.  Based on history, this seems most likely to be chronic thromboembolic pulmonary hypertension.     - Continue  Eliquis.  - Check V/Q scan to assess for chronic PEs. If V/Q scan is concerning for chronic thromboembolic disease, will need to assess  with CTA chest and consider referral to [facility] for balloon pulmonary angioplasty (would not be good candidate for pulmonary thromboendarterectomy).  - He needs home sleep study given concern for OSA.  - Send ANA, anti-centromere antibody and RF as serologic workup for PH.  - With suspected CTEPH, I will start him on Adempas (we are working on approval).  3. Tricuspid regurgitation: Moderate to severe on echo 05/23. Minimal on echo in 9/23.  4. History PE/DVT: PE/DVT 09/21 s/p TPA, required pressor support with NE. Recurrent PE in 5/23 off Eliquis. Now concern for chronic thromboembolic disease as above.  - Will need lifelong anticoagulation, on Eliquis 5 bid. No bleeding issues. 5. CKD stage 3: BMET today.  6. HTN: BP controlled.  7. Spinal stenosis: Limited mobility at baseline.  8. OSA: Strongly suspect.   - As above, arrange home sleep study.   Followup in 2 months.   Loralie Champagne 07/26/2022

## 2022-07-26 NOTE — Patient Instructions (Signed)
Increase Torsemide to 30 mg (1 1/2 Tab) daily.  Increase Spironolactone to 25 mg daily.  Start Adempas once you receive it   Labs done today, your results will be available in MyChart, we will contact you for abnormal readings.  Repeat blood work in 10 days   Your provider has ordered you a V/Q scan. Once approved by your insurance company you will be called to have the test arranged.  Your provider has recommended that you have a home sleep study.  We have provided you with the equipment in our office today. Please download the app and follow the instructions. YOUR PIN NUMBER IS: [id number]. Once you have completed the test you just dispose of the equipment, the information is automatically uploaded to [place] via blue-tooth technology. If your test is positive for sleep apnea and you need a home CPAP machine you will be contacted by Dr Theodosia Blender office [facility]) to set this up.   Your physician recommends that you schedule a follow-up appointment in: 2 months.  If you have any questions or concerns before your next appointment please send [place] a message through [place] or call our office at [phone].    TO LEAVE A MESSAGE FOR THE NURSE SELECT OPTION 2, PLEASE LEAVE A MESSAGE INCLUDING: YOUR NAME DATE OF BIRTH CALL BACK NUMBER REASON FOR CALL**this is important as we prioritize the call backs  YOU WILL RECEIVE A CALL BACK THE SAME DAY AS LONG AS YOU CALL BEFORE 4:00 PM  At the [facility], you and your health needs are our priority. As part of our continuing mission to provide you with exceptional heart care, we have created designated Provider Care Teams. These Care Teams include your primary Cardiologist (physician) and Advanced Practice Providers (APPs- Physician Assistants and Nurse Practitioners) who all work together to provide you with the care you need, when you need it.   You may see any of the following providers on your designated Care Team at your next follow  up: Dr Glori Bickers Dr Loralie Champagne Dr. Roxana Hires, NP Lyda Jester, [place] [facility] [place], [place] Forestine Na, NP Audry Riles, PharmD   Please be sure to bring in all your medications bottles to every appointment.

## 2022-07-27 ENCOUNTER — Telehealth (HOSPITAL_COMMUNITY): Payer: Self-pay

## 2022-07-27 DIAGNOSIS — I5032 Chronic diastolic (congestive) heart failure: Secondary | ICD-10-CM

## 2022-07-27 DIAGNOSIS — E785 Hyperlipidemia, unspecified: Secondary | ICD-10-CM | POA: Diagnosis not present

## 2022-07-27 DIAGNOSIS — N1831 Chronic kidney disease, stage 3a: Secondary | ICD-10-CM | POA: Diagnosis not present

## 2022-07-27 DIAGNOSIS — M48061 Spinal stenosis, lumbar region without neurogenic claudication: Secondary | ICD-10-CM | POA: Diagnosis not present

## 2022-07-27 DIAGNOSIS — M419 Scoliosis, unspecified: Secondary | ICD-10-CM | POA: Diagnosis not present

## 2022-07-27 DIAGNOSIS — Z9981 Dependence on supplemental oxygen: Secondary | ICD-10-CM | POA: Diagnosis not present

## 2022-07-27 DIAGNOSIS — M1990 Unspecified osteoarthritis, unspecified site: Secondary | ICD-10-CM | POA: Diagnosis not present

## 2022-07-27 DIAGNOSIS — E1122 Type 2 diabetes mellitus with diabetic chronic kidney disease: Secondary | ICD-10-CM | POA: Diagnosis not present

## 2022-07-27 DIAGNOSIS — M5459 Other low back pain: Secondary | ICD-10-CM | POA: Diagnosis not present

## 2022-07-27 DIAGNOSIS — I1310 Hypertensive heart and chronic kidney disease without heart failure, with stage 1 through stage 4 chronic kidney disease, or unspecified chronic kidney disease: Secondary | ICD-10-CM | POA: Diagnosis not present

## 2022-07-27 LAB — ENA+DNA/DS+SJORGEN'S
ENA SM Ab Ser-aCnc: <0.2 AI (ref 0.0–0.9)
Ribonucleic Protein: 0.2 AI (ref 0.0–0.9)
SSA (Ro) (ENA) Antibody, IgG: 0.2 AI (ref 0.0–0.9)
SSB (La) (ENA) Antibody, IgG: <0.2 AI (ref 0.0–0.9)
ds DNA Ab: <1 IU/mL (ref 0–9)

## 2022-07-27 LAB — ANA W/REFLEX: Anti Nuclear Antibody (ANA): POSITIVE — AB

## 2022-07-27 LAB — RHEUMATOID FACTOR: Rheumatoid fact SerPl-aCnc: 38.5 IU/mL — ABNORMAL HIGH (ref <14.0)

## 2022-07-27 NOTE — Telephone Encounter (Addendum)
Pt aware, agreeable, and verbalized understanding  Referral sent   ----- Message from Larey Dresser, MD sent at 07/27/2022  1:58 PM EDT ----- Positive ANA and elevated RF.  There is a chance that his pulmonary hypertension is related to rheumatologic disease.  Would please refer to Dr. Vernelle Emerald at [facility] rheumatology to evaluate this.

## 2022-07-30 ENCOUNTER — Telehealth (HOSPITAL_COMMUNITY): Payer: Self-pay | Admitting: Cardiology

## 2022-07-30 NOTE — Telephone Encounter (Signed)
Pt left message on triage line with concerns about sleep study app. Reports he is unable to complete download  Will forward to staff assisting with sleep studies as they maybe able to trouble shoot

## 2022-07-31 ENCOUNTER — Telehealth (HOSPITAL_COMMUNITY): Payer: Self-pay | Admitting: Surgery

## 2022-07-31 DIAGNOSIS — M25571 Pain in right ankle and joints of right foot: Secondary | ICD-10-CM | POA: Diagnosis not present

## 2022-07-31 DIAGNOSIS — I10 Essential (primary) hypertension: Secondary | ICD-10-CM | POA: Diagnosis not present

## 2022-07-31 DIAGNOSIS — Z9181 History of falling: Secondary | ICD-10-CM | POA: Diagnosis not present

## 2022-07-31 DIAGNOSIS — G8929 Other chronic pain: Secondary | ICD-10-CM | POA: Diagnosis not present

## 2022-07-31 DIAGNOSIS — Z5320 Procedure and treatment not carried out because of patient's decision for unspecified reasons: Secondary | ICD-10-CM | POA: Diagnosis not present

## 2022-07-31 DIAGNOSIS — M549 Dorsalgia, unspecified: Secondary | ICD-10-CM | POA: Diagnosis not present

## 2022-07-31 DIAGNOSIS — Z79899 Other long term (current) drug therapy: Secondary | ICD-10-CM | POA: Diagnosis not present

## 2022-07-31 DIAGNOSIS — R7303 Prediabetes: Secondary | ICD-10-CM | POA: Diagnosis not present

## 2022-07-31 NOTE — Telephone Encounter (Signed)
Patient called regarding issues performing ordered home sleep study.  I have corrected the issue in the Watch PAT system and called him to ask that he attempt to do the study again.  He is agreeable.

## 2022-07-31 NOTE — Telephone Encounter (Signed)
Advanced Heart Failure Patient Advocate Encounter  Called Adempas support to check the status of the patient's application. Representative stated that the referral was sent to [facility] on 10/17. Transferred to [place]. They are waiting for the patient to call Jeani Hawking (RN) at [phone] to consent to starting the medication.  Called and spoke with the patient. Provided phone number to [facility]. He stated that he would call her today.

## 2022-08-01 ENCOUNTER — Telehealth (HOSPITAL_COMMUNITY): Payer: Self-pay | Admitting: Surgery

## 2022-08-01 DIAGNOSIS — E785 Hyperlipidemia, unspecified: Secondary | ICD-10-CM | POA: Diagnosis not present

## 2022-08-01 DIAGNOSIS — I1310 Hypertensive heart and chronic kidney disease without heart failure, with stage 1 through stage 4 chronic kidney disease, or unspecified chronic kidney disease: Secondary | ICD-10-CM | POA: Diagnosis not present

## 2022-08-01 DIAGNOSIS — M419 Scoliosis, unspecified: Secondary | ICD-10-CM | POA: Diagnosis not present

## 2022-08-01 DIAGNOSIS — M1990 Unspecified osteoarthritis, unspecified site: Secondary | ICD-10-CM | POA: Diagnosis not present

## 2022-08-01 DIAGNOSIS — N1831 Chronic kidney disease, stage 3a: Secondary | ICD-10-CM | POA: Diagnosis not present

## 2022-08-01 DIAGNOSIS — Z9981 Dependence on supplemental oxygen: Secondary | ICD-10-CM | POA: Diagnosis not present

## 2022-08-01 DIAGNOSIS — M5459 Other low back pain: Secondary | ICD-10-CM | POA: Diagnosis not present

## 2022-08-01 DIAGNOSIS — M48061 Spinal stenosis, lumbar region without neurogenic claudication: Secondary | ICD-10-CM | POA: Diagnosis not present

## 2022-08-01 DIAGNOSIS — E1122 Type 2 diabetes mellitus with diabetic chronic kidney disease: Secondary | ICD-10-CM | POA: Diagnosis not present

## 2022-08-01 NOTE — Telephone Encounter (Signed)
Mr Keith [name] called again regarding issues with his sleep study device.  He will return to clinic on Friday and we will change it out as he feels it is defective and is not operating appropriately.

## 2022-08-03 ENCOUNTER — Telehealth (HOSPITAL_COMMUNITY): Payer: Self-pay | Admitting: Surgery

## 2022-08-03 ENCOUNTER — Ambulatory Visit (HOSPITAL_COMMUNITY)
Admission: RE | Admit: 2022-08-03 | Discharge: 2022-08-03 | Disposition: A | Discharge status: Home / Self Care | Payer: Medicare Other | Source: Ambulatory Visit | Attending: Cardiology | Admitting: Cardiology

## 2022-08-03 DIAGNOSIS — I5032 Chronic diastolic (congestive) heart failure: Secondary | ICD-10-CM

## 2022-08-03 LAB — BASIC METABOLIC PANEL
Anion gap: 6 (ref 5–15)
BUN: 28 mg/dL — ABNORMAL HIGH (ref 8–23)
CO2: 26 mmol/L (ref 22–32)
Calcium: 10.5 mg/dL — ABNORMAL HIGH (ref 8.9–10.3)
Chloride: 107 mmol/L (ref 98–111)
Creatinine, Ser: 1.62 mg/dL — ABNORMAL HIGH (ref 0.61–1.24)
GFR, Estimated: 45 mL/min — ABNORMAL LOW (ref >60)
Glucose, Bld: 153 mg/dL — ABNORMAL HIGH (ref 70–99)
Potassium: 4.8 mmol/L (ref 3.5–5.1)
Sodium: 139 mmol/L (ref 135–145)

## 2022-08-03 NOTE — Addendum Note (Signed)
Encounter addended by: Micki Riley, RN on: 08/03/2022 2:14 PM  Actions taken: Clinical Note Signed

## 2022-08-03 NOTE — Telephone Encounter (Signed)
I confirmed with Dr. Aundra Dubin and will discontinue the home oxygen as ordered.  I have faxed an order to [facility] at [phone].

## 2022-08-03 NOTE — Progress Notes (Signed)
I gave Keith [name] a new [place] device for home use. He had issues with the last device and will retry his study. He also requests that I send a prescription to discontinue his order for oxygen so that the home health company will pick up the oxygen and he will no longer have charges related to it.  He is no longer using the oxygen and is doing cardiac rehab three times weekly.

## 2022-08-06 ENCOUNTER — Telehealth: Payer: Self-pay | Admitting: Pharmacist

## 2022-08-06 ENCOUNTER — Encounter (HOSPITAL_BASED_OUTPATIENT_CLINIC_OR_DEPARTMENT_OTHER): Payer: Medicare Other | Admitting: Cardiology

## 2022-08-06 ENCOUNTER — Telehealth (HOSPITAL_COMMUNITY): Payer: Self-pay | Admitting: Surgery

## 2022-08-06 DIAGNOSIS — G4733 Obstructive sleep apnea (adult) (pediatric): Secondary | ICD-10-CM | POA: Diagnosis not present

## 2022-08-06 DIAGNOSIS — I5032 Chronic diastolic (congestive) heart failure: Secondary | ICD-10-CM

## 2022-08-06 MED ORDER — TORSEMIDE 20 MG PO TABS: 20.0000 mg | ORAL_TABLET | Freq: Every day | ORAL | 3 refills | Status: DC

## 2022-08-06 NOTE — Chronic Care Management (AMB) (Signed)
Chronic Care Management Pharmacy Assistant   Name: SHIZUO BISKUP  MRN: [id number] DOB: 24-Nov-1950  Reason for Encounter: Medication Coordination  Recent office visits:  07/18/22 [name], [name], MD - Patient presented for Type 2 diabetes mellitus with diabetic neuropathy without long term current use of insulin and other concerns. Changed Ferrous Sulfate. Changed Ropinirole.  Recent consult visits:  07/26/22 Larey Dresser, MD - Patient presented for Chronic diastolic heart failure and other concerns. Increased Spironolactone. Increased Torsemide.  Hospital visits:  Medication Reconciliation was completed by comparing discharge summary, patient's EMR and Pharmacy list, and upon discussion with patient.  Patient presented to [facility] Cath Lab on 07/06/22. Patient was present for 5 hours.    New?Medications Started at [facility] Discharge:?? -started  none  Medication Changes at Hospital Discharge: -Changed  none  Medications Discontinued at Hospital Discharge: -Stopped  none  Medications that remain the same after Hospital Discharge:??  -All other medications will remain the same.    Medications: Outpatient Encounter Medications as of 08/06/2022  Medication Sig   acetaminophen (TYLENOL) 325 MG tablet Take 1-2 tablets (325-650 mg total) by mouth every 4 (four) hours as needed for mild pain.   amLODipine (NORVASC) 10 MG tablet Take 1 tablet (10 mg total) by mouth daily.   apixaban (ELIQUIS) 5 MG TABS tablet Take 1 tablet (5 mg total) by mouth 2 (two) times daily.   Blood Glucose Monitoring Suppl (ACCU-CHEK GUIDE ME) w/Device KIT USE TO TEST BLOOD SUGARS 1-2 TIMES DAILY.   carvedilol (COREG) 12.5 MG tablet Take 1 tablet (12.5 mg total) by mouth 2 (two) times daily with a meal.   cyanocobalamin (VITAMIN B12) 1000 MCG tablet Take 1,000 mcg by mouth daily.   DULoxetine (CYMBALTA) 60 MG capsule TAKE 1 CAPSULE BY MOUTH EVERY DAY   ferrous sulfate 325 (65  FE) MG tablet Take 1 tablet (325 mg total) by mouth every other day.   gabapentin (NEURONTIN) 300 MG capsule Take 1 capsule (300 mg total) by mouth 2 (two) times daily.   HYDROcodone-acetaminophen (NORCO) 10-325 MG tablet Take 1 tablet by mouth in the morning, at noon, and at bedtime.   hydrocortisone (ANUSOL-HC) 2.5 % rectal cream Place 1 application. rectally as needed for hemorrhoids or anal itching.   levETIRAcetam (KEPPRA) 250 MG tablet TAKE 1 TABLET BY MOUTH TWICE A DAY   lidocaine (LIDODERM) 5 % Place 1 patch onto the skin daily as needed (pain). Remove & Discard patch within 12 hours or as directed by MD   naloxone (NARCAN) nasal spray 4 mg/0.1 mL Place 1 spray into the nose as needed (accidental overdose).   polyethylene glycol (MIRALAX / GLYCOLAX) 17 g packet Take 17 g by mouth daily as needed for mild constipation.   rOPINIRole (REQUIP) 1 MG tablet Take 1 tablet (1 mg total) by mouth at bedtime.   rosuvastatin (CRESTOR) 20 MG tablet Take 1 tablet (20 mg total) by mouth daily.   spironolactone (ALDACTONE) 25 MG tablet Take 1 tablet (25 mg total) by mouth daily.   torsemide (DEMADEX) 20 MG tablet Take 1 tablet (20 mg total) by mouth daily.   traZODone (DESYREL) 50 MG tablet TAKE 1/2 TO 1 TABLET BY MOUTH AT BEDTIME AS NEEDED FOR SLEEP (Patient taking differently: Take 25 mg by mouth at bedtime as needed for sleep. for sleep)   No facility-administered encounter medications on file as of 08/06/2022.   Notes: Received call from patient reports he has been having trouble  filling his [facility]cod[facility]Ac[facility] with [facility] as they have been out of stock medication is on backorder and he has been without it for 4 days now. Call to [facility] / [facility] in [place] they both report they do not have enough tabs in stock to fill for patient. [facility]ified [facility] that this medication is in stock and he can have a one time delivery of this medication on today. Pharmacy reports in stock and pt  will have charge of 11.00. Call to prescribing office asked they send the prescription over to [facility] STAT.  Call back to the patient he reports he will have his wife pick up from the pharmacy on today and is considering [facility] he states he will talk it over with his wife and they will decide and he will let me know if hed like to proceed.  Care Gaps: Eye Exam - Overdue COVID Booster - Overdue Flu Vaccine - Postponed CCM- 1/24 AWV- 4/23 Lab Results  Component Value Date   HGBA1C 6.2 07/18/2022    Star Rating Drugs: Rosuvastatin 20 mg - Last filled 05/16/22 90 DS at [facility] Pharmacist Assistant [phone]

## 2022-08-06 NOTE — Telephone Encounter (Signed)
Patient called to review results and recommendations per provider.  He is aware and agreeable.  I have scheduled repeat lab appt Nov 13th and updated medlist in [facility].

## 2022-08-06 NOTE — Telephone Encounter (Signed)
-----   Message from Larey Dresser, MD sent at 08/03/2022  4:02 PM EDT ----- Decrease torsemide back to 20 mg daily.  BMET 2 wks.

## 2022-08-07 DIAGNOSIS — M5459 Other low back pain: Secondary | ICD-10-CM | POA: Diagnosis not present

## 2022-08-07 DIAGNOSIS — N1831 Chronic kidney disease, stage 3a: Secondary | ICD-10-CM | POA: Diagnosis not present

## 2022-08-07 DIAGNOSIS — E1122 Type 2 diabetes mellitus with diabetic chronic kidney disease: Secondary | ICD-10-CM | POA: Diagnosis not present

## 2022-08-07 DIAGNOSIS — M48061 Spinal stenosis, lumbar region without neurogenic claudication: Secondary | ICD-10-CM | POA: Diagnosis not present

## 2022-08-07 DIAGNOSIS — I1310 Hypertensive heart and chronic kidney disease without heart failure, with stage 1 through stage 4 chronic kidney disease, or unspecified chronic kidney disease: Secondary | ICD-10-CM | POA: Diagnosis not present

## 2022-08-07 DIAGNOSIS — M1990 Unspecified osteoarthritis, unspecified site: Secondary | ICD-10-CM | POA: Diagnosis not present

## 2022-08-07 DIAGNOSIS — E785 Hyperlipidemia, unspecified: Secondary | ICD-10-CM | POA: Diagnosis not present

## 2022-08-07 DIAGNOSIS — Z9981 Dependence on supplemental oxygen: Secondary | ICD-10-CM | POA: Diagnosis not present

## 2022-08-07 DIAGNOSIS — M419 Scoliosis, unspecified: Secondary | ICD-10-CM | POA: Diagnosis not present

## 2022-08-07 NOTE — Procedures (Signed)
SLEEP STUDY REPORT Patient Information Study Date: 08/06/22 Patient Name: Keith [name] Patient ID: [id number] Birth Date: 12/01/2050 Age: 71 Gender: Male BMI: 38.8 (W=286 lb, H=6' 0'') Stopbang: 6 Referring Physician: Loralie Champagne, MD  TEST DESCRIPTION:  Home sleep apnea testing was completed using the WatchPat, a Type 1 device, utilizing peripheral arterial tonometry (PAT), chest movement, actigraphy, pulse oximetry, pulse rate, body position and snore.  AHI was calculated with apnea and hypopnea using valid sleep time as the denominator. RDI includes apneas, hypopneas, and RERAs.  The data acquired and the scoring of sleep and all associated events were performed in accordance with the recommended standards and specifications as outlined in the AASM Manual for the Scoring of Sleep and Associated Events 2.2.0 (2015).  FINDINGS:  1.  Moderate Obstructive Sleep Apnea with AHI 21.4/hr.   2.  No Central Sleep Apnea with pAHIc 0.6/hr.  3.  Oxygen desaturations as low as 82%.  4.  Severe snoring was present. O2 sats were < 88% for 5 min.  5.  Total sleep time was 6 hrs and 37 min.  6.  33.4% of total sleep time was spent in REM sleep.   7.  Normal sleep onset latency at 16 min  8.  Shortened REM sleep onset latency at 56 min.   9.  Total awakenings were 12.  10. Arrhythmia detection:  None.  DIAGNOSIS:   Moderate Obstructive Sleep Apnea (G47.33)  RECOMMENDATIONS:   1.  Clinical correlation of these findings is necessary.  The decision to treat obstructive sleep apnea (OSA) is usually based on the presence of apnea symptoms or the presence of associated medical conditions such as Hypertension, Congestive Heart Failure, Atrial Fibrillation or Obesity.  The most common symptoms of OSA are snoring, gasping for breath while sleeping, daytime sleepiness and fatigue.   2.  Initiating apnea therapy is recommended given the presence of symptoms and/or associated conditions. Recommend  proceeding with one of the following:     a.  Auto-CPAP therapy with a pressure range of 5-20cm H2O.     b.  An oral appliance (OA) that can be obtained from certain dentists with expertise in sleep medicine.  These are primarily of use in non-obese patients with mild and moderate disease.     c.  An ENT consultation which may be useful to look for specific causes of obstruction and possible treatment options.     d.  If patient is intolerant to PAP therapy, consider referral to ENT for evaluation for hypoglossal nerve stimulator.   3.  Close follow-up is necessary to ensure success with CPAP or oral appliance therapy for maximum benefit.  4.  A follow-up oximetry study on CPAP is recommended to assess the adequacy of therapy and determine the need for supplemental oxygen or the potential need for Bi-level therapy.  An arterial blood gas to determine the adequacy of baseline ventilation and oxygenation should also be considered.  5.  Healthy sleep recommendations include:  adequate nightly sleep (normal 7-9 hrs/night), avoidance of caffeine after noon and alcohol near bedtime, and maintaining a sleep environment that is cool, dark and quiet.  6.  Weight loss for overweight patients is recommended.  Even modest amounts of weight loss can significantly improve the severity of sleep apnea.  7.  Snoring recommendations include:  weight loss where appropriate, side sleeping, and avoidance of alcohol before bed.  8.  Operation of motor vehicle should not be performed when sleepy.  Signature: Fransico Him, MD;  [facility]; [address], Tax adviser of Sleep Medicine Electronically Signed: 08/07/22 Rev.

## 2022-08-08 ENCOUNTER — Telehealth: Payer: Self-pay | Admitting: Pharmacist

## 2022-08-08 NOTE — Chronic Care Management (AMB) (Signed)
Chronic Care Management Pharmacy Assistant   Name: Keith [name]  MRN: [id number] DOB: 08-Oct-1951  Reason for Encounter: Disease State   Conditions to be addressed/monitored: HTN  Recent office visits:  07/18/22 [name], [name], MD - Patient presented for Type 2 Diabetes Mellitus with diabetic neuropathy without long term current use of insulin and other concerns. Changed Ferrous Sulfate. Changed Ropinirole.  Recent consult visits:  08/06/22 Sueanne Margarita, MD (Cardiology) - Patient presented for Obstructive sleep apnea. Ordered CPAP  07/26/22 Larey Dresser, MD (Cardiology) - Patient presented for Chronic diastolic heart failure and other concerns. Increased Spironolactone. Increased Torsemide.  Hospital visits:  Medication Reconciliation was completed by comparing discharge summary, patient's EMR and Pharmacy list, and upon discussion with patient.   Patient presented to [facility] Cath Lab on 07/06/22. Patient was present for 5 hours.     New?Medications Started at [facility] Discharge:?? -started  none   Medication Changes at Hospital Discharge: -Changed  none   Medications Discontinued at Hospital Discharge: -Stopped  none   Medications that remain the same after Hospital Discharge:??  -All other medications will remain the same  Medications: Outpatient Encounter Medications as of 08/08/2022  Medication Sig   acetaminophen (TYLENOL) 325 MG tablet Take 1-2 tablets (325-650 mg total) by mouth every 4 (four) hours as needed for mild pain.   amLODipine (NORVASC) 10 MG tablet Take 1 tablet (10 mg total) by mouth daily.   apixaban (ELIQUIS) 5 MG TABS tablet Take 1 tablet (5 mg total) by mouth 2 (two) times daily.   Blood Glucose Monitoring Suppl (ACCU-CHEK GUIDE ME) w/Device KIT USE TO TEST BLOOD SUGARS 1-2 TIMES DAILY.   carvedilol (COREG) 12.5 MG tablet Take 1 tablet (12.5 mg total) by mouth 2 (two) times daily with a meal.   cyanocobalamin  (VITAMIN B12) 1000 MCG tablet Take 1,000 mcg by mouth daily.   DULoxetine (CYMBALTA) 60 MG capsule TAKE 1 CAPSULE BY MOUTH EVERY DAY   ferrous sulfate 325 (65 FE) MG tablet Take 1 tablet (325 mg total) by mouth every other day.   gabapentin (NEURONTIN) 300 MG capsule Take 1 capsule (300 mg total) by mouth 2 (two) times daily.   HYDROcodone-acetaminophen (NORCO) 10-325 MG tablet Take 1 tablet by mouth in the morning, at noon, and at bedtime.   hydrocortisone (ANUSOL-HC) 2.5 % rectal cream Place 1 application. rectally as needed for hemorrhoids or anal itching.   levETIRAcetam (KEPPRA) 250 MG tablet TAKE 1 TABLET BY MOUTH TWICE A DAY   lidocaine (LIDODERM) 5 % Place 1 patch onto the skin daily as needed (pain). Remove & Discard patch within 12 hours or as directed by MD   naloxone (NARCAN) nasal spray 4 mg/0.1 mL Place 1 spray into the nose as needed (accidental overdose).   polyethylene glycol (MIRALAX / GLYCOLAX) 17 g packet Take 17 g by mouth daily as needed for mild constipation.   rOPINIRole (REQUIP) 1 MG tablet Take 1 tablet (1 mg total) by mouth at bedtime.   rosuvastatin (CRESTOR) 20 MG tablet Take 1 tablet (20 mg total) by mouth daily.   spironolactone (ALDACTONE) 25 MG tablet Take 1 tablet (25 mg total) by mouth daily.   torsemide (DEMADEX) 20 MG tablet Take 1 tablet (20 mg total) by mouth daily.   traZODone (DESYREL) 50 MG tablet TAKE 1/2 TO 1 TABLET BY MOUTH AT BEDTIME AS NEEDED FOR SLEEP (Patient taking differently: Take 25 mg by mouth at bedtime as needed for  sleep. for sleep)   No facility-administered encounter medications on file as of 08/08/2022.  Reviewed chart prior to disease state call. Spoke with patient regarding BP  Recent Office Vitals: BP Readings from Last 3 Encounters:  07/26/22 130/80  07/18/22 120/88  07/06/22 (!) 153/90   Pulse Readings from Last 3 Encounters:  07/26/22 91  07/18/22 82  07/06/22 81    Wt Readings from Last 3 Encounters:  07/26/22 286 lb 6.4  oz (129.9 kg)  07/06/22 278 lb (126.1 kg)  06/25/22 293 lb 6.4 oz (133.1 kg)     Kidney Function Lab Results  Component Value Date/Time   CREATININE 1.62 (H) 08/03/2022 12:02 PM   CREATININE 1.44 (H) 07/26/2022 10:48 AM   CREATININE 1.70 (H) 03/09/2022 03:42 PM   GFR 40.06 (L) 01/17/2022 11:40 AM   GFRNONAA 45 (L) 08/03/2022 12:02 PM   GFRAA >60 07/11/2020 06:19 AM       Latest Ref Rng & Units 08/03/2022   12:02 PM 07/26/2022   10:48 AM 07/06/2022   10:41 AM  BMP  Glucose 70 - 99 mg/dL 153  132    BUN 8 - 23 mg/dL 28  22    Creatinine 0.61 - 1.24 mg/dL 1.62  1.44    Sodium 135 - 145 mmol/L 139  145  144    144   Potassium 3.5 - 5.1 mmol/L 4.8  4.6  4.2    4.0   Chloride 98 - 111 mmol/L 107  109    CO2 22 - 32 mmol/L 26  28    Calcium 8.9 - 10.3 mg/dL 10.5  10.1      Current antihypertensive regimen:  Spironolactone 25 mg 1/2 tablet daily - Appropriate, Query effective, Safe, Accessible Carvedilol 12.5 mg 1 tablet twice daily - Appropriate, Query effective, Safe, Accessible Amlodipine 10 mg 1 tablet daily - Appropriate, Query effective, Safe, Accessible How often are you checking your Blood Pressure? daily Current home BP readings: 120/80 / 115/80 Patient denies any hypotensive symptoms. What recent interventions/DTPs have been made by any provider to improve Blood Pressure control since last CPP Visit: Patient reports no changes. Any recent hospitalizations or ED visits since last visit with CPP? No Patient reports he is pleased to report he is doing very well, has lost another 6 pounds, blood pressures have been good and he has also lost lots of swelling and fluid. He further reported he has a prescription that will be running out on 08/14/22 for his Duloxetine and requested fills for it. Advised him I would forward to the office on his behalf after trying to reach the prescriber, he was in agreement.  Adherence Review: Is the patient currently on ACE/ARB medication?  Yes Does the patient have >5 day gap between last estimated fill dates? No   Care Gaps: Eye Exam - Overdue COVID Booster - Overdue Flu Vaccine - Postponed CCM- 1/24 AWV- 4/23 Lab Results  Component Value Date   HGBA1C 6.2 07/18/2022    Star Rating Drugs: Rosuvastatin 20 mg - Last filled 05/16/22 90 DS at [facility] Pharmacist Assistant [phone]

## 2022-08-09 ENCOUNTER — Ambulatory Visit: Payer: Medicare Other | Attending: Cardiology

## 2022-08-09 ENCOUNTER — Other Ambulatory Visit (HOSPITAL_COMMUNITY): Payer: Self-pay

## 2022-08-09 DIAGNOSIS — I5032 Chronic diastolic (congestive) heart failure: Secondary | ICD-10-CM

## 2022-08-09 DIAGNOSIS — M419 Scoliosis, unspecified: Secondary | ICD-10-CM | POA: Diagnosis not present

## 2022-08-09 DIAGNOSIS — M5459 Other low back pain: Secondary | ICD-10-CM | POA: Diagnosis not present

## 2022-08-09 DIAGNOSIS — E785 Hyperlipidemia, unspecified: Secondary | ICD-10-CM | POA: Diagnosis not present

## 2022-08-09 DIAGNOSIS — E1122 Type 2 diabetes mellitus with diabetic chronic kidney disease: Secondary | ICD-10-CM | POA: Diagnosis not present

## 2022-08-09 DIAGNOSIS — Z9981 Dependence on supplemental oxygen: Secondary | ICD-10-CM | POA: Diagnosis not present

## 2022-08-09 DIAGNOSIS — M1990 Unspecified osteoarthritis, unspecified site: Secondary | ICD-10-CM | POA: Diagnosis not present

## 2022-08-09 DIAGNOSIS — M48061 Spinal stenosis, lumbar region without neurogenic claudication: Secondary | ICD-10-CM | POA: Diagnosis not present

## 2022-08-09 DIAGNOSIS — N1831 Chronic kidney disease, stage 3a: Secondary | ICD-10-CM | POA: Diagnosis not present

## 2022-08-09 DIAGNOSIS — I1310 Hypertensive heart and chronic kidney disease without heart failure, with stage 1 through stage 4 chronic kidney disease, or unspecified chronic kidney disease: Secondary | ICD-10-CM | POA: Diagnosis not present

## 2022-08-09 MED ORDER — ADEMPAS 0.5 MG PO TABS: ORAL_TABLET | ORAL | 11 refills | Status: DC

## 2022-08-10 ENCOUNTER — Telehealth: Payer: Self-pay

## 2022-08-13 NOTE — Progress Notes (Signed)
Per MP Patient should have a fill left on his Duloxetine at [facility], call to them for confirmation, they report he has 1 left and may fill on or after 08/14/22 call to patient and made him aware.     [place] Clinical Pharmacist Assistant [phone]

## 2022-08-14 DIAGNOSIS — E1122 Type 2 diabetes mellitus with diabetic chronic kidney disease: Secondary | ICD-10-CM | POA: Diagnosis not present

## 2022-08-14 DIAGNOSIS — M5459 Other low back pain: Secondary | ICD-10-CM | POA: Diagnosis not present

## 2022-08-14 DIAGNOSIS — N1831 Chronic kidney disease, stage 3a: Secondary | ICD-10-CM | POA: Diagnosis not present

## 2022-08-14 DIAGNOSIS — M48061 Spinal stenosis, lumbar region without neurogenic claudication: Secondary | ICD-10-CM | POA: Diagnosis not present

## 2022-08-14 DIAGNOSIS — I1310 Hypertensive heart and chronic kidney disease without heart failure, with stage 1 through stage 4 chronic kidney disease, or unspecified chronic kidney disease: Secondary | ICD-10-CM | POA: Diagnosis not present

## 2022-08-14 DIAGNOSIS — E785 Hyperlipidemia, unspecified: Secondary | ICD-10-CM | POA: Diagnosis not present

## 2022-08-14 DIAGNOSIS — M419 Scoliosis, unspecified: Secondary | ICD-10-CM | POA: Diagnosis not present

## 2022-08-14 DIAGNOSIS — M1990 Unspecified osteoarthritis, unspecified site: Secondary | ICD-10-CM | POA: Diagnosis not present

## 2022-08-14 DIAGNOSIS — Z9981 Dependence on supplemental oxygen: Secondary | ICD-10-CM | POA: Diagnosis not present

## 2022-08-15 ENCOUNTER — Telehealth: Payer: Self-pay | Admitting: *Deleted

## 2022-08-15 DIAGNOSIS — G4733 Obstructive sleep apnea (adult) (pediatric): Secondary | ICD-10-CM

## 2022-08-15 NOTE — Telephone Encounter (Signed)
The patient has been notified of the result and verbalized understanding.  All questions (if any) were answered. Keith [name], [place] 08/15/2022 12:24 PM    Upon patient request DME selection is [facility]. Patient understands he will be contacted by [facility] to set up his cpap. Patient understands to call if [facility] does not contact him with new setup in a timely manner. Patient understands they will be called once confirmation has been received from [facility]/ that they have received their new machine to schedule 10 week follow up appointment.   [facility] notified of new cpap order  Please add to airview Patient was grateful for the call and thanked me.

## 2022-08-15 NOTE — Telephone Encounter (Signed)
-----   Message from Lauralee Evener, [place] sent at 08/08/2022  9:27 AM EDT -----  ----- Message ----- From: Sueanne Margarita, MD Sent: 08/07/2022   5:14 PM EDT To: Cv Div Sleep Studies  Please let patient know that they have sleep apnea and recommend treating with CPAP.  Please order an auto CPAP from 4-15cm H2O with heated humidity and mask of choice.  Order overnight pulse ox on CPAP.  Followup with me in 6 weeks.

## 2022-08-16 DIAGNOSIS — M5459 Other low back pain: Secondary | ICD-10-CM | POA: Diagnosis not present

## 2022-08-16 DIAGNOSIS — E785 Hyperlipidemia, unspecified: Secondary | ICD-10-CM | POA: Diagnosis not present

## 2022-08-16 DIAGNOSIS — N1831 Chronic kidney disease, stage 3a: Secondary | ICD-10-CM | POA: Diagnosis not present

## 2022-08-16 DIAGNOSIS — M419 Scoliosis, unspecified: Secondary | ICD-10-CM | POA: Diagnosis not present

## 2022-08-16 DIAGNOSIS — I1310 Hypertensive heart and chronic kidney disease without heart failure, with stage 1 through stage 4 chronic kidney disease, or unspecified chronic kidney disease: Secondary | ICD-10-CM | POA: Diagnosis not present

## 2022-08-16 DIAGNOSIS — M48061 Spinal stenosis, lumbar region without neurogenic claudication: Secondary | ICD-10-CM | POA: Diagnosis not present

## 2022-08-16 DIAGNOSIS — Z9981 Dependence on supplemental oxygen: Secondary | ICD-10-CM | POA: Diagnosis not present

## 2022-08-16 DIAGNOSIS — E1122 Type 2 diabetes mellitus with diabetic chronic kidney disease: Secondary | ICD-10-CM | POA: Diagnosis not present

## 2022-08-16 DIAGNOSIS — M1990 Unspecified osteoarthritis, unspecified site: Secondary | ICD-10-CM | POA: Diagnosis not present

## 2022-08-20 ENCOUNTER — Ambulatory Visit (HOSPITAL_COMMUNITY)
Admission: RE | Admit: 2022-08-20 | Discharge: 2022-08-20 | Disposition: A | Discharge status: Home / Self Care | Payer: Medicare Other | Source: Ambulatory Visit | Attending: Cardiology | Admitting: Cardiology

## 2022-08-20 DIAGNOSIS — I5032 Chronic diastolic (congestive) heart failure: Secondary | ICD-10-CM | POA: Insufficient documentation

## 2022-08-20 LAB — BASIC METABOLIC PANEL
Anion gap: 8 (ref 5–15)
BUN: 21 mg/dL (ref 8–23)
CO2: 22 mmol/L (ref 22–32)
Calcium: 9.7 mg/dL (ref 8.9–10.3)
Chloride: 110 mmol/L (ref 98–111)
Creatinine, Ser: 1.51 mg/dL — ABNORMAL HIGH (ref 0.61–1.24)
GFR, Estimated: 49 mL/min — ABNORMAL LOW (ref >60)
Glucose, Bld: 141 mg/dL — ABNORMAL HIGH (ref 70–99)
Potassium: 4.2 mmol/L (ref 3.5–5.1)
Sodium: 140 mmol/L (ref 135–145)

## 2022-08-21 DIAGNOSIS — M419 Scoliosis, unspecified: Secondary | ICD-10-CM | POA: Diagnosis not present

## 2022-08-21 DIAGNOSIS — M1990 Unspecified osteoarthritis, unspecified site: Secondary | ICD-10-CM | POA: Diagnosis not present

## 2022-08-21 DIAGNOSIS — M5459 Other low back pain: Secondary | ICD-10-CM | POA: Diagnosis not present

## 2022-08-21 DIAGNOSIS — M48061 Spinal stenosis, lumbar region without neurogenic claudication: Secondary | ICD-10-CM | POA: Diagnosis not present

## 2022-08-21 DIAGNOSIS — N1831 Chronic kidney disease, stage 3a: Secondary | ICD-10-CM | POA: Diagnosis not present

## 2022-08-21 DIAGNOSIS — E785 Hyperlipidemia, unspecified: Secondary | ICD-10-CM | POA: Diagnosis not present

## 2022-08-21 DIAGNOSIS — E1122 Type 2 diabetes mellitus with diabetic chronic kidney disease: Secondary | ICD-10-CM | POA: Diagnosis not present

## 2022-08-21 DIAGNOSIS — Z9981 Dependence on supplemental oxygen: Secondary | ICD-10-CM | POA: Diagnosis not present

## 2022-08-21 DIAGNOSIS — I1310 Hypertensive heart and chronic kidney disease without heart failure, with stage 1 through stage 4 chronic kidney disease, or unspecified chronic kidney disease: Secondary | ICD-10-CM | POA: Diagnosis not present

## 2022-08-23 DIAGNOSIS — I1310 Hypertensive heart and chronic kidney disease without heart failure, with stage 1 through stage 4 chronic kidney disease, or unspecified chronic kidney disease: Secondary | ICD-10-CM | POA: Diagnosis not present

## 2022-08-23 DIAGNOSIS — Z9981 Dependence on supplemental oxygen: Secondary | ICD-10-CM | POA: Diagnosis not present

## 2022-08-23 DIAGNOSIS — M1990 Unspecified osteoarthritis, unspecified site: Secondary | ICD-10-CM | POA: Diagnosis not present

## 2022-08-23 DIAGNOSIS — E785 Hyperlipidemia, unspecified: Secondary | ICD-10-CM | POA: Diagnosis not present

## 2022-08-23 DIAGNOSIS — M48061 Spinal stenosis, lumbar region without neurogenic claudication: Secondary | ICD-10-CM | POA: Diagnosis not present

## 2022-08-23 DIAGNOSIS — M419 Scoliosis, unspecified: Secondary | ICD-10-CM | POA: Diagnosis not present

## 2022-08-23 DIAGNOSIS — E1122 Type 2 diabetes mellitus with diabetic chronic kidney disease: Secondary | ICD-10-CM | POA: Diagnosis not present

## 2022-08-23 DIAGNOSIS — M5459 Other low back pain: Secondary | ICD-10-CM | POA: Diagnosis not present

## 2022-08-23 DIAGNOSIS — N1831 Chronic kidney disease, stage 3a: Secondary | ICD-10-CM | POA: Diagnosis not present

## 2022-08-27 DIAGNOSIS — E1122 Type 2 diabetes mellitus with diabetic chronic kidney disease: Secondary | ICD-10-CM | POA: Diagnosis not present

## 2022-08-27 DIAGNOSIS — N1831 Chronic kidney disease, stage 3a: Secondary | ICD-10-CM | POA: Diagnosis not present

## 2022-08-27 DIAGNOSIS — Z9981 Dependence on supplemental oxygen: Secondary | ICD-10-CM | POA: Diagnosis not present

## 2022-08-27 DIAGNOSIS — I1310 Hypertensive heart and chronic kidney disease without heart failure, with stage 1 through stage 4 chronic kidney disease, or unspecified chronic kidney disease: Secondary | ICD-10-CM | POA: Diagnosis not present

## 2022-08-27 DIAGNOSIS — M48061 Spinal stenosis, lumbar region without neurogenic claudication: Secondary | ICD-10-CM | POA: Diagnosis not present

## 2022-08-27 DIAGNOSIS — E785 Hyperlipidemia, unspecified: Secondary | ICD-10-CM | POA: Diagnosis not present

## 2022-08-27 DIAGNOSIS — M5459 Other low back pain: Secondary | ICD-10-CM | POA: Diagnosis not present

## 2022-08-27 DIAGNOSIS — M1990 Unspecified osteoarthritis, unspecified site: Secondary | ICD-10-CM | POA: Diagnosis not present

## 2022-08-27 DIAGNOSIS — M419 Scoliosis, unspecified: Secondary | ICD-10-CM | POA: Diagnosis not present

## 2022-08-28 ENCOUNTER — Telehealth (HOSPITAL_COMMUNITY): Payer: Self-pay | Admitting: Pharmacy Technician

## 2022-08-28 NOTE — Telephone Encounter (Signed)
Advanced Heart Failure Patient Advocate Encounter  Patient left vm asking for assistance with obtaining refill on Amlodipine and Torsemide. No refills should be needed at this time. The patient is aware to contact [facility] for Amlodipine refills.   Charlann Boxer, CPhT

## 2022-08-29 DIAGNOSIS — Z79899 Other long term (current) drug therapy: Secondary | ICD-10-CM | POA: Diagnosis not present

## 2022-08-29 DIAGNOSIS — Z5320 Procedure and treatment not carried out because of patient's decision for unspecified reasons: Secondary | ICD-10-CM | POA: Diagnosis not present

## 2022-08-29 DIAGNOSIS — M25571 Pain in right ankle and joints of right foot: Secondary | ICD-10-CM | POA: Diagnosis not present

## 2022-08-29 DIAGNOSIS — M549 Dorsalgia, unspecified: Secondary | ICD-10-CM | POA: Diagnosis not present

## 2022-08-29 DIAGNOSIS — G8929 Other chronic pain: Secondary | ICD-10-CM | POA: Diagnosis not present

## 2022-08-29 DIAGNOSIS — Z9181 History of falling: Secondary | ICD-10-CM | POA: Diagnosis not present

## 2022-08-29 DIAGNOSIS — I10 Essential (primary) hypertension: Secondary | ICD-10-CM | POA: Diagnosis not present

## 2022-09-01 ENCOUNTER — Other Ambulatory Visit: Payer: Self-pay | Admitting: Family Medicine

## 2022-09-01 DIAGNOSIS — D509 Iron deficiency anemia, unspecified: Secondary | ICD-10-CM

## 2022-09-04 DIAGNOSIS — Z79899 Other long term (current) drug therapy: Secondary | ICD-10-CM | POA: Diagnosis not present

## 2022-09-07 DIAGNOSIS — G4733 Obstructive sleep apnea (adult) (pediatric): Secondary | ICD-10-CM | POA: Diagnosis not present

## 2022-09-13 ENCOUNTER — Ambulatory Visit (HOSPITAL_COMMUNITY)
Admission: RE | Admit: 2022-09-13 | Discharge: 2022-09-13 | Disposition: A | Discharge status: Home / Self Care | Payer: Medicare Other | Source: Ambulatory Visit | Attending: Cardiology | Admitting: Cardiology

## 2022-09-13 ENCOUNTER — Encounter (HOSPITAL_COMMUNITY): Payer: Self-pay | Admitting: Cardiology

## 2022-09-13 VITALS — BP 94/58 | HR 89 | Wt 275.4 lb

## 2022-09-13 DIAGNOSIS — I272 Pulmonary hypertension, unspecified: Secondary | ICD-10-CM

## 2022-09-13 DIAGNOSIS — E1122 Type 2 diabetes mellitus with diabetic chronic kidney disease: Secondary | ICD-10-CM | POA: Insufficient documentation

## 2022-09-13 DIAGNOSIS — N183 Chronic kidney disease, stage 3 unspecified: Secondary | ICD-10-CM | POA: Insufficient documentation

## 2022-09-13 DIAGNOSIS — Z86718 Personal history of other venous thrombosis and embolism: Secondary | ICD-10-CM | POA: Diagnosis not present

## 2022-09-13 DIAGNOSIS — M4800 Spinal stenosis, site unspecified: Secondary | ICD-10-CM | POA: Insufficient documentation

## 2022-09-13 DIAGNOSIS — I2721 Secondary pulmonary arterial hypertension: Secondary | ICD-10-CM | POA: Insufficient documentation

## 2022-09-13 DIAGNOSIS — Z79899 Other long term (current) drug therapy: Secondary | ICD-10-CM | POA: Diagnosis not present

## 2022-09-13 DIAGNOSIS — Z86711 Personal history of pulmonary embolism: Secondary | ICD-10-CM | POA: Diagnosis not present

## 2022-09-13 DIAGNOSIS — E669 Obesity, unspecified: Secondary | ICD-10-CM | POA: Diagnosis not present

## 2022-09-13 DIAGNOSIS — I130 Hypertensive heart and chronic kidney disease with heart failure and stage 1 through stage 4 chronic kidney disease, or unspecified chronic kidney disease: Secondary | ICD-10-CM | POA: Diagnosis not present

## 2022-09-13 DIAGNOSIS — Z86018 Personal history of other benign neoplasm: Secondary | ICD-10-CM | POA: Diagnosis not present

## 2022-09-13 DIAGNOSIS — G4733 Obstructive sleep apnea (adult) (pediatric): Secondary | ICD-10-CM | POA: Insufficient documentation

## 2022-09-13 DIAGNOSIS — I5032 Chronic diastolic (congestive) heart failure: Secondary | ICD-10-CM | POA: Diagnosis not present

## 2022-09-13 LAB — BASIC METABOLIC PANEL
Anion gap: 9 (ref 5–15)
BUN: 19 mg/dL (ref 8–23)
CO2: 22 mmol/L (ref 22–32)
Calcium: 10.7 mg/dL — ABNORMAL HIGH (ref 8.9–10.3)
Chloride: 108 mmol/L (ref 98–111)
Creatinine, Ser: 1.67 mg/dL — ABNORMAL HIGH (ref 0.61–1.24)
GFR, Estimated: 44 mL/min — ABNORMAL LOW (ref >60)
Glucose, Bld: 126 mg/dL — ABNORMAL HIGH (ref 70–99)
Potassium: 4.4 mmol/L (ref 3.5–5.1)
Sodium: 139 mmol/L (ref 135–145)

## 2022-09-13 LAB — BRAIN NATRIURETIC PEPTIDE: B Natriuretic Peptide: 13.9 pg/mL (ref 0.0–100.0)

## 2022-09-13 NOTE — Patient Instructions (Addendum)
There has been no changes to your medications..  Labs done today, your results will be available in MyChart, we will contact you for abnormal readings.  Your physician recommends that you schedule a follow-up appointment in: 3 months   If you have any questions or concerns before your next appointment please send us a message through mychart or call our office at [phone].    TO LEAVE A MESSAGE FOR THE NURSE SELECT OPTION 2, PLEASE LEAVE A MESSAGE INCLUDING: YOUR NAME DATE OF BIRTH CALL BACK NUMBER REASON FOR CALL**this is important as we prioritize the call backs  YOU WILL RECEIVE A CALL BACK THE SAME DAY AS LONG AS YOU CALL BEFORE 4:00 PM  At the Advanced [facility] Failure [facility], you and your health needs are our priority. As part of our continuing mission to provide you with exceptional heart care, we have created designated Provider Care Teams. These Care Teams include your primary Cardiologist (physician) and Advanced Practice Providers (APPs- Physician Assistants and Nurse Practitioners) who all work together to provide you with the care you need, when you need it.   You may see any of the following providers on your designated Care Team at your next follow up: Dr Daniel Bensimhon Dr Dalton McLean Dr. Aditya Sabharwal Amy Clegg, NP Brittainy Simmons, PA Jessica Milford,NP Lindsay Finch, PA Alma Diaz, NP Lauren Kemp, PharmD   Please be sure to bring in all your medications bottles to every appointment.    

## 2022-09-14 NOTE — Progress Notes (Signed)
ADVANCED HF CLINIC CONSULT NOTE  Primary Care: [name], [name], MD HF Cardiologist: Dr. Aundra Dubin   HPI: Keith [name] is a 71 y.o.male with history saddle PE/LLE DVT 19/14 s/p TPA complicated by hypotension requiring Levophed, multiple prior back surgeries, left adrenal adenoma, DM II, obesity, HTN.   Admitted 03/01/22 with CP. He was hypoxic requiring supplemental oxygen. V/Q scan with moderate to high probability of PE.  Lifelong DOAC recommended, started on Eliquis 5 BID (had been off eliquis since 02/22).   Echo in 5/23 showed EF 55-60%, mild LVH, interventricular septum flattened in systole and diastole consistent with RV pressure and volume overload, RV severely enlarged with severely reduced function, RVSP 75 mmH, moderate to severe TR.   RHC/LHC in 5/23 showed no CAD, elevated right and left heart filling pressures, and pulmonary venous hypertension.  He was seen by CT surgery. Felt to be high risk for pulmonary embolectomy. Course complicated by AKI, Scr peaked at 3.34 and improved to 1.60 (baseline). Started on torsemide 20 mg M, W, F at discharge. Atenolol/HTCZ stopped.   Echo in 9/23 showed EF 55-60%, mildly decreased RV function with mild-moderate RV enlargement, unable to estimate PA systolic pressure, IVC normal. RHC was done in 10/23, showing moderate pulmonary arterial hypertension with PVR 5 WU.    Patient returns for followup of RV failure.  He walks very little due to spinal stenosis, not CHF.  He is primarily in the wheelchair. He walks with PT and a walker at home.  He is now using CPAP.  Weight is down 11 lbs.  Overall, he is breathing better with weight loss though still not very active. No orthopnea/PND.  No chest pain.  SBP 110s-120s when he checks at home.   Labs (6/23): K 4.0, creatinine 1.28 Labs (7/23): K 4.1, creatinine 1.69, BNP 20 Labs (9/23): K 4.2, creatinine 1.42, BNP 33 Labs (10/23): RF elevated, ANA elevated, K 4.6, creatinine 1.44, BNP 16 Labs (11/23):  creatinine 1.5  ECG (personally reviewed): NSR 1st degree AVB, LAFB  PMH: 1. Type 2 diabetes 2. Left adrenal adenoma 3. Obesity 4. HTN 5. H/o back surgery for spinal stenosis: Has had 3 surgeries, actually could not walk after the third surgery but has been doing PT.  6. Venous thromboembolism: Saddle PE in 9/21 with LLE DVT, this was treated with TPA.   - Recurrent PE in 5/23 after stopping DOAC.  - Echo (5/23): EF 55-60%, mild LVH, interventricular septum flattened in systole and diastole consistent with RV pressure and volume overload, RV severely enlarged with severely reduced function, RVSP 75 mmH, moderate to severe TR.  - R/LHC (5/23): RA 12, PA 48/34 (39), PCWP mean 40, V wave 42, Fick CO/CI 5.93/2.33, PVR ?< 1 WU.  - Echo (9/23): EF 55-60%, mildly decreased RV function with mild-moderate RV enlargement, unable to estimate PA systolic pressure, IVC normal.  - RHC (10/23): mean RA 9, PA 60/30 mean 36, mean PCWP 6, CI 2.46, PVR 5 WU 7. CKD stage 3 8. OSA: Uses CPAP.   Review of Systems: All systems reviewed and negative except as per HPI.    Current Outpatient Medications  Medication Sig Dispense Refill   acetaminophen (TYLENOL) 325 MG tablet Take 1-2 tablets (325-650 mg total) by mouth every 4 (four) hours as needed for mild pain.     amLODipine (NORVASC) 10 MG tablet Take 1 tablet (10 mg total) by mouth daily. 90 tablet 1   apixaban (ELIQUIS) 5 MG TABS tablet Take 1 tablet (  5 mg total) by mouth 2 (two) times daily. 60 tablet 11   Blood Glucose Monitoring Suppl (ACCU-CHEK GUIDE ME) w/Device KIT USE TO TEST BLOOD SUGARS 1-2 TIMES DAILY. 1 kit 0   carvedilol (COREG) 12.5 MG tablet Take 1 tablet (12.5 mg total) by mouth 2 (two) times daily with a meal. 180 tablet 3   cyanocobalamin (VITAMIN B12) 1000 MCG tablet Take 1,000 mcg by mouth daily.     DULoxetine (CYMBALTA) 60 MG capsule TAKE 1 CAPSULE BY MOUTH EVERY DAY 90 capsule 2   ferrous sulfate 325 (65 FE) MG EC tablet Take 325 mg  by mouth every other day.     gabapentin (NEURONTIN) 300 MG capsule Take 300 mg by mouth 3 (three) times daily.     HYDROcodone-acetaminophen (NORCO) 10-325 MG tablet Take 1 tablet by mouth in the morning, at noon, and at bedtime.     hydrocortisone (ANUSOL-HC) 2.5 % rectal cream Place 1 application. rectally as needed for hemorrhoids or anal itching.     levETIRAcetam (KEPPRA) 250 MG tablet TAKE 1 TABLET BY MOUTH TWICE A DAY 180 tablet 1   lidocaine (LIDODERM) 5 % Place 1 patch onto the skin daily as needed (pain). Remove & Discard patch within 12 hours or as directed by MD     naloxone (NARCAN) nasal spray 4 mg/0.1 mL Place 1 spray into the nose as needed (accidental overdose).     polyethylene glycol (MIRALAX / GLYCOLAX) 17 g packet Take 17 g by mouth daily as needed for mild constipation.     Riociguat (ADEMPAS) 1.5 MG TABS Take 1.5 mg by mouth in the morning, at noon, and at bedtime.     rOPINIRole (REQUIP) 1 MG tablet Take 1 tablet (1 mg total) by mouth at bedtime. 90 tablet 2   rosuvastatin (CRESTOR) 20 MG tablet Take 1 tablet (20 mg total) by mouth daily. 90 tablet 3   spironolactone (ALDACTONE) 25 MG tablet Take 1 tablet (25 mg total) by mouth daily. 90 tablet 3   torsemide (DEMADEX) 20 MG tablet Take 1 tablet (20 mg total) by mouth daily. 90 tablet 3   traZODone (DESYREL) 50 MG tablet TAKE 1/2 TO 1 TABLET BY MOUTH AT BEDTIME AS NEEDED FOR SLEEP 90 tablet 1   No current facility-administered medications for this encounter.   Allergies  Allergen Reactions   Aspirin Other (See Comments)    Irritates the stomach and the patient developed ulcers, also   Lisinopril Other (See Comments)    Caused a body ache   Social History   Socioeconomic History   Marital status: Married    Spouse name: Not on file   Number of children: Not on file   Years of education: Not on file   Highest education level: Not on file  Occupational History   Occupation: retired  Tobacco Use   Smoking  status: Former    Packs/day: 1.00    Years: 25.00    Total pack years: 25.00    Types: Cigarettes    Quit date: 1995    Years since quitting: 28.9   Smokeless tobacco: Never  Vaping Use   Vaping Use: Never used  Substance and Sexual Activity   Alcohol use: Not Currently    Comment: holidays and special occasions   Drug use: Yes    Types: Oxycodone   Sexual activity: Yes    Birth control/protection: None  Other Topics Concern   Not on file  Social History Narrative   Married.  Social Determinants of Health   Financial Resource Strain: Low Risk  (03/06/2022)   Overall Financial Resource Strain (CARDIA)    Difficulty of Paying Living Expenses: Not hard at all  Food Insecurity: No Food Insecurity (05/25/2022)   Hunger Vital Sign    Worried About Running Out of Food in the Last Year: Never true    Ran Out of Food in the Last Year: Never true  Transportation Needs: No Transportation Needs (05/25/2022)   PRAPARE - Hydrologist (Medical): No    Lack of Transportation (Non-Medical): No  Physical Activity: Sufficiently Active (01/09/2022)   Exercise Vital Sign    Days of Exercise per Week: 7 days    Minutes of Exercise per Session: 30 min  Stress: No Stress Concern Present (01/09/2022)   [place]    Feeling of Stress : Not at all  Social Connections: Moderately Integrated (01/03/2021)   Social Connection and Isolation Panel [NHANES]    Frequency of Communication with Friends and Family: Three times a week    Frequency of Social Gatherings with Friends and Family: Three times a week    Attends Religious Services: More than 4 times per year    Active Member of Clubs or Organizations: No    Attends Archivist Meetings: Never    Marital Status: Married  Human resources officer Violence: Not At Risk (05/25/2022)   Humiliation, Afraid, Rape, and Kick questionnaire    Fear of Current or  Ex-Partner: No    Emotionally Abused: No    Physically Abused: No    Sexually Abused: No   Family History  Problem Relation Age of Onset   Diabetes Mother    Asthma Mother    Hypertension Father    Stroke Father    Colon cancer Neg Hx    BP (!) 94/58   Pulse 89   Wt 124.9 kg (275 lb 6.4 oz)   SpO2 97%   BMI 37.35 kg/m   Wt Readings from Last 3 Encounters:  09/13/22 124.9 kg (275 lb 6.4 oz)  07/26/22 129.9 kg (286 lb 6.4 oz)  07/06/22 126.1 kg (278 lb)   PHYSICAL EXAM: General: NAD Neck: No JVD, no thyromegaly or thyroid nodule.  Lungs: Clear to auscultation bilaterally with normal respiratory effort. CV: Nondisplaced PMI.  Heart regular S1/S2, no S3/S4, no murmur.  Trace ankle edema.  No carotid bruit.  Normal pedal pulses.  Abdomen: Soft, nontender, no hepatosplenomegaly, no distention.  Skin: Intact without lesions or rashes.  Neurologic: Alert and oriented x 3.  Psych: Normal affect. Extremities: No clubbing or cyanosis.  HEENT: Normal.   ASSESSMENT & PLAN: 1.  Chronic diastolic CHF with RV failure:  Suspect RV strain from prior PEs.  Echo in 5/23 showed EF 55-60%, RV severely reduced, RVSP 75 mmHg, moderate to severe TR.  Echo in 9/23 showed EF 55-60%, mildly decreased RV function with mild-moderate RV enlargement, unable to estimate PA systolic pressure, IVC normal.  RHC in 10/23 showed moderate pulmonary arterial hypertension.  He does not looks volume overloaded today. Functional status is complicated by poor mobility due to spinal stenosis.  - Continue torsemide 20 mg daily, BMET/BNP today.   - Continue spironolactone 25 mg daily.  - Hold off on SGLT2i with recurrent UTIs.  2. Pulmonary hypertension: RHC in 10/23 showed moderate pulmonary arterial hypertension.  Based on history, this seems most likely to be chronic thromboembolic pulmonary hypertension.     -  Continue Eliquis.  - Still need to get V/Q scan to assess for chronic PEs, will order again today. If V/Q  scan is concerning for chronic thromboembolic disease, will need to assess with CTA chest and consider referral to [facility] for balloon pulmonary angioplasty (would not be good candidate for pulmonary thromboendarterectomy).  - Continue CPAP for OSA.  - Serologic workup for PAH showed elevated RF and ANA, has appt with rheumatology to evaluate.  - With suspected CTEPH, we are titrating up Adempas.  He feels like this has helped.  3. Tricuspid regurgitation: Moderate to severe on echo 05/23. Minimal on echo in 9/23.  4. History PE/DVT: PE/DVT 09/21 s/p TPA, required pressor support with NE. Recurrent PE in 5/23 off Eliquis. Now concern for chronic thromboembolic disease as above.  - Will need lifelong anticoagulation, on Eliquis 5 bid. No bleeding issues. 5. CKD stage 3: BMET today.  6. HTN: BP controlled.  7. Spinal stenosis: Limited mobility at baseline.  8. OSA: Continue CPAP.   Loralie Champagne 09/14/2022

## 2022-09-17 ENCOUNTER — Encounter (HOSPITAL_COMMUNITY): Payer: Self-pay

## 2022-09-17 ENCOUNTER — Encounter (HOSPITAL_COMMUNITY): Payer: Medicare Other

## 2022-09-17 ENCOUNTER — Other Ambulatory Visit (HOSPITAL_COMMUNITY): Payer: Medicare Other

## 2022-09-21 ENCOUNTER — Telehealth (HOSPITAL_COMMUNITY): Payer: Self-pay | Admitting: Pharmacist

## 2022-09-21 NOTE — Telephone Encounter (Signed)
Patient Advocate Encounter   Received notification from [employer] that prior authorization for Adempas is required.   PA submitted on CoverMyMeds Key [id number] Status is pending   Will continue to follow.   Audry Riles, PharmD, BCPS, BCCP, CPP Heart Failure Clinic Pharmacist [phone]

## 2022-09-21 NOTE — Telephone Encounter (Signed)
Advanced Heart Failure Patient Advocate Encounter  Prior Authorization for Adempas has been approved.     Effective dates: 09/21/22 through 10/08/23  Audry Riles, PharmD, BCPS, BCCP, CPP Heart Failure Clinic Pharmacist [phone]

## 2022-09-26 ENCOUNTER — Telehealth (HOSPITAL_COMMUNITY): Payer: Self-pay | Admitting: Surgery

## 2022-09-26 DIAGNOSIS — Z9181 History of falling: Secondary | ICD-10-CM | POA: Diagnosis not present

## 2022-09-26 DIAGNOSIS — I10 Essential (primary) hypertension: Secondary | ICD-10-CM | POA: Diagnosis not present

## 2022-09-26 DIAGNOSIS — Z5320 Procedure and treatment not carried out because of patient's decision for unspecified reasons: Secondary | ICD-10-CM | POA: Diagnosis not present

## 2022-09-26 DIAGNOSIS — M25551 Pain in right hip: Secondary | ICD-10-CM | POA: Diagnosis not present

## 2022-09-26 DIAGNOSIS — M25571 Pain in right ankle and joints of right foot: Secondary | ICD-10-CM | POA: Diagnosis not present

## 2022-09-26 DIAGNOSIS — M549 Dorsalgia, unspecified: Secondary | ICD-10-CM | POA: Diagnosis not present

## 2022-09-26 DIAGNOSIS — Z79899 Other long term (current) drug therapy: Secondary | ICD-10-CM | POA: Diagnosis not present

## 2022-09-26 DIAGNOSIS — G8929 Other chronic pain: Secondary | ICD-10-CM | POA: Diagnosis not present

## 2022-09-26 NOTE — Telephone Encounter (Signed)
Patient called regarding an X ray that was ordered/scheduled 12/11 and cancelled secondary to the fact that it was not authorized through insurance.  I have forwarded to our [place] that works on authorizations and will request that she assist with reschedule and approval.

## 2022-10-08 DIAGNOSIS — G4733 Obstructive sleep apnea (adult) (pediatric): Secondary | ICD-10-CM | POA: Diagnosis not present

## 2022-10-10 ENCOUNTER — Telehealth (HOSPITAL_COMMUNITY): Payer: Self-pay | Admitting: *Deleted

## 2022-10-10 DIAGNOSIS — M7061 Trochanteric bursitis, right hip: Secondary | ICD-10-CM | POA: Diagnosis not present

## 2022-10-10 DIAGNOSIS — M25551 Pain in right hip: Secondary | ICD-10-CM | POA: Diagnosis not present

## 2022-10-19 NOTE — Telephone Encounter (Signed)
Trust Leh 09/07/2022 - 10/19/2022 DOB: 01-22-1951 Age: 72 years Compliance Report Usage 09/07/2022 - 10/19/2022 Usage days 0/43 days (0%) >= 4 hours 0 days (0%) < 4 hours 0 days (0%) Other 43 days (100%) Average usage (total days) 0 hours 0 minutes Average usage (days used) 0 hours 0 minutes Best 30 08/09/2022 - 09/07/2022 0/30 days (0%) G3 Auto-CPAP [id number] Mode: Unknown Settings Collected: Not Found SN Assigned:

## 2022-10-21 NOTE — Progress Notes (Unsigned)
SLEEP MEDICINE VIRTUAL CONSULT NOTE  via Video Note   Because of Keith [name]'s co-morbid illnesses, he is at least at moderate risk for complications without adequate follow up.  This format is felt to be most appropriate for this patient at this time.  All issues noted in this document were discussed and addressed.  A limited physical exam was performed with this format.  Please refer to the patient's chart for his consent to telehealth for [facility].      Date:  10/22/2022   ID:  Keith [name], DOB 01-23-1951, MRN [id number] The patient was identified using 2 identifiers.  Patient Location: Home Provider Location: Home Office   PCP:  [name], [name], [place] Providers Cardiologist:  None     Evaluation Performed:  New Patient Evaluation  Chief Complaint:  OSA  History of Present Illness:    Keith [name] is a 72 y.o. male who is being seen today for the evaluation of OSA at the request of Keith Champagne, MD.  TAMEEM PULLARA is a 72 y.o. male with a hx of chronic diastolic CHF, PHTN, TR, PE/DVT, HTN.  He underwent HST showing moderate OSA with an AHI of 21.4/hr and was started on auto CPAP from 4 to 15cm H2O.  He is now referred to sleep medicine for consultation to establish sleep care.  He is doing well with his PAP device and thinks that he has gotten used to it.  He tried the nasal pillow mask but did not tolerate it and changed to a FFM.  He just got a FFM yesterday but has not been able to use the device because he has had a bad cold but plans to try to use the FFM tonight.   Past Medical History:  Diagnosis Date   Allergy    Chronic back pain    Diabetes mellitus without complication ([place])    DNR (do not resuscitate) 11/19/2020   Duodenal ulcer hemorrhage 08/29/2014   ED (erectile dysfunction)    Esophageal stricture 08/30/2014   Glucose intolerance (impaired glucose tolerance)    Hiatal hernia 08/30/2014    Hypertension    Hypokalemia 04/11/2013   MRSA carrier 08/30/2014   Obesity    Osteoarthritis    Pneumonia    Pulmonary embolism ([place]) 06/2020   Scoliosis 2016   Spinal stenosis of lumbar region    Urinary tract infection 04/11/2013   Past Surgical History:  Procedure Laterality Date   BACK SURGERY  08/07/2021   COLONOSCOPY  2008   ESOPHAGOGASTRODUODENOSCOPY N/A 08/29/2014   Procedure: ESOPHAGOGASTRODUODENOSCOPY (EGD);  Surgeon: Lafayette Dragon, MD;  Location: [facility];  Service: Endoscopy;  Laterality: N/A;   LAMINECTOMY N/A 07/28/2018   Procedure: THORACIC ELEVEN- THORACIC TWELVE POSTERIOR DECOMPRESSION LAMINECTOMY;  Surgeon: Judith Part, MD;  Location: [place];  Service: Neurosurgery;  Laterality: N/A;   LUMBAR LAMINECTOMY     NASAL POLYP SURGERY     ORIF ANKLE FRACTURE Right 07/01/2020   Procedure: OPEN REDUCTION INTERNAL FIXATION (ORIF) ANKLE FRACTURE. TRIMALLEOLAR;  Surgeon: Marchia Bond, MD;  Location: [place];  Service: Orthopedics;  Laterality: Right;   RIGHT HEART CATH N/A 07/06/2022   Procedure: RIGHT HEART CATH;  Surgeon: Larey Dresser, MD;  Location: [place] [facility];  Service: Cardiovascular;  Laterality: N/A;   RIGHT/LEFT HEART CATH AND CORONARY ANGIOGRAPHY N/A 03/02/2022   Procedure: RIGHT/LEFT HEART CATH AND CORONARY ANGIOGRAPHY;  Surgeon: Dixie Dials,  MD;  Location: [facility];  Service: Cardiovascular;  Laterality: N/A;   TONSILLECTOMY       No outpatient medications have been marked as taking for the 10/22/22 encounter (Video Visit) with Sueanne Margarita, MD.     Allergies:   Aspirin and Lisinopril   Social History   Tobacco Use   Smoking status: Former    Packs/day: 1.00    Years: 25.00    Total pack years: 25.00    Types: Cigarettes    Quit date: 1995    Years since quitting: 29.0   Smokeless tobacco: Never  Vaping Use   Vaping Use: Never used  Substance Use Topics   Alcohol use: Not Currently    Comment: holidays and special  occasions   Drug use: Yes    Types: Oxycodone     Family Hx: The patient's family history includes Asthma in his mother; Diabetes in his mother; Hypertension in his father; Stroke in his father. There is no history of Colon cancer.  ROS:   Please see the history of present illness.     All other systems reviewed and are negative.   Prior Sleep studies:   The following studies were reviewed today:  HST, PAP compliance download  Labs/Other Tests and Data Reviewed:     Recent Labs: 03/04/2022: ALT 55 07/06/2022: Hemoglobin 11.6; Hemoglobin 11.2; Platelets 219 09/13/2022: B Natriuretic Peptide 13.9; BUN 19; Creatinine, Ser 1.67; Potassium 4.4; Sodium 139    Wt Readings from Last 3 Encounters:  10/22/22 273 lb (123.8 kg)  09/13/22 275 lb 6.4 oz (124.9 kg)  07/26/22 286 lb 6.4 oz (129.9 kg)     Risk Assessment/Calculations:          Objective:    Vital Signs:  Wt 273 lb (123.8 kg)   BMI 37.03 kg/m    VITAL SIGNS:  reviewed GEN:  no acute distress EYES:  sclerae anicteric, EOMI - Extraocular Movements Intact RESPIRATORY:  normal respiratory effort, symmetric expansion CARDIOVASCULAR:  no peripheral edema SKIN:  no rash, lesions or ulcers. MUSCULOSKELETAL:  no obvious deformities. NEURO:  alert and oriented x 3, no obvious focal deficit PSYCH:  normal affect  ASSESSMENT & PLAN:    OSA  - the patient has not been using his device at all since he got it - he initially had issues with the nasal mask and now has a bad cold - I encouraged him to try to use the FFM he just got and then I will see him back in 3 months to see how he is doing  HTN -BP controlled  -continue prescription drug management with Amlodipine '10mg'$  daily, Carvedilol 12.'5mg'$  BID and spiro '25mg'$  daily with PRN refills   Time:   Today, I have spent 15 minutes with the patient with telehealth technology discussing the above problems.     Medication Adjustments/Labs and Tests Ordered: Current  medicines are reviewed at length with the patient today.  Concerns regarding medicines are outlined above.   Tests Ordered: No orders of the defined types were placed in this encounter.   Medication Changes: No orders of the defined types were placed in this encounter.   Follow Up:  In Person in 3 month(s)  Signed, Fransico Him, MD  10/22/2022 10:05 AM    [place]

## 2022-10-22 ENCOUNTER — Encounter: Payer: Self-pay | Admitting: Cardiology

## 2022-10-22 ENCOUNTER — Ambulatory Visit: Payer: Medicare Other | Attending: Cardiology | Admitting: Cardiology

## 2022-10-22 VITALS — Wt 273.0 lb

## 2022-10-22 DIAGNOSIS — G4733 Obstructive sleep apnea (adult) (pediatric): Secondary | ICD-10-CM

## 2022-10-22 DIAGNOSIS — I10 Essential (primary) hypertension: Secondary | ICD-10-CM

## 2022-10-22 NOTE — Patient Instructions (Signed)
Medication Instructions:  Your physician recommends that you continue on your current medications as directed. Please refer to the Current Medication list given to you today.  *If you need a refill on your cardiac medications before your next appointment, please call your pharmacy*  Follow-Up: At [facility], you and your health needs are our priority.  As part of our continuing mission to provide you with exceptional heart care, we have created designated Provider Care Teams.  These Care Teams include your primary Cardiologist (physician) and Advanced Practice Providers (APPs -  Physician Assistants and Nurse Practitioners) who all work together to provide you with the care you need, when you need it.   Your next appointment:   3 month(s)  Provider:   Dr. Drucilla Schmidt

## 2022-10-23 ENCOUNTER — Emergency Department (HOSPITAL_BASED_OUTPATIENT_CLINIC_OR_DEPARTMENT_OTHER): Payer: Medicare Other

## 2022-10-23 ENCOUNTER — Inpatient Hospital Stay (HOSPITAL_BASED_OUTPATIENT_CLINIC_OR_DEPARTMENT_OTHER)
Admission: EM | Admit: 2022-10-23 | Discharge: 2022-10-25 | DRG: 193 | Disposition: A | Discharge status: Home / Self Care | Payer: Medicare Other | Attending: Internal Medicine | Admitting: Internal Medicine

## 2022-10-23 ENCOUNTER — Other Ambulatory Visit: Payer: Self-pay

## 2022-10-23 ENCOUNTER — Encounter (HOSPITAL_BASED_OUTPATIENT_CLINIC_OR_DEPARTMENT_OTHER): Payer: Self-pay

## 2022-10-23 ENCOUNTER — Encounter (HOSPITAL_COMMUNITY): Payer: Self-pay

## 2022-10-23 DIAGNOSIS — Z87891 Personal history of nicotine dependence: Secondary | ICD-10-CM

## 2022-10-23 DIAGNOSIS — Z823 Family history of stroke: Secondary | ICD-10-CM

## 2022-10-23 DIAGNOSIS — E669 Obesity, unspecified: Secondary | ICD-10-CM | POA: Diagnosis present

## 2022-10-23 DIAGNOSIS — Z20822 Contact with and (suspected) exposure to covid-19: Secondary | ICD-10-CM | POA: Diagnosis not present

## 2022-10-23 DIAGNOSIS — Z6837 Body mass index (BMI) 37.0-37.9, adult: Secondary | ICD-10-CM

## 2022-10-23 DIAGNOSIS — E785 Hyperlipidemia, unspecified: Secondary | ICD-10-CM | POA: Diagnosis present

## 2022-10-23 DIAGNOSIS — M48061 Spinal stenosis, lumbar region without neurogenic claudication: Secondary | ICD-10-CM | POA: Diagnosis not present

## 2022-10-23 DIAGNOSIS — J121 Respiratory syncytial virus pneumonia: Principal | ICD-10-CM | POA: Diagnosis present

## 2022-10-23 DIAGNOSIS — Z86718 Personal history of other venous thrombosis and embolism: Secondary | ICD-10-CM

## 2022-10-23 DIAGNOSIS — I509 Heart failure, unspecified: Secondary | ICD-10-CM | POA: Diagnosis not present

## 2022-10-23 DIAGNOSIS — Z86711 Personal history of pulmonary embolism: Secondary | ICD-10-CM | POA: Diagnosis not present

## 2022-10-23 DIAGNOSIS — G4700 Insomnia, unspecified: Secondary | ICD-10-CM | POA: Diagnosis present

## 2022-10-23 DIAGNOSIS — G894 Chronic pain syndrome: Secondary | ICD-10-CM | POA: Diagnosis not present

## 2022-10-23 DIAGNOSIS — Z7901 Long term (current) use of anticoagulants: Secondary | ICD-10-CM | POA: Diagnosis not present

## 2022-10-23 DIAGNOSIS — I10 Essential (primary) hypertension: Secondary | ICD-10-CM | POA: Diagnosis present

## 2022-10-23 DIAGNOSIS — R768 Other specified abnormal immunological findings in serum: Secondary | ICD-10-CM | POA: Diagnosis present

## 2022-10-23 DIAGNOSIS — Z825 Family history of asthma and other chronic lower respiratory diseases: Secondary | ICD-10-CM | POA: Diagnosis not present

## 2022-10-23 DIAGNOSIS — J210 Acute bronchiolitis due to respiratory syncytial virus: Principal | ICD-10-CM

## 2022-10-23 DIAGNOSIS — J9601 Acute respiratory failure with hypoxia: Secondary | ICD-10-CM | POA: Diagnosis not present

## 2022-10-23 DIAGNOSIS — R0602 Shortness of breath: Secondary | ICD-10-CM | POA: Diagnosis not present

## 2022-10-23 DIAGNOSIS — I110 Hypertensive heart disease with heart failure: Secondary | ICD-10-CM | POA: Diagnosis present

## 2022-10-23 DIAGNOSIS — E876 Hypokalemia: Secondary | ICD-10-CM | POA: Diagnosis not present

## 2022-10-23 DIAGNOSIS — Z833 Family history of diabetes mellitus: Secondary | ICD-10-CM | POA: Diagnosis not present

## 2022-10-23 DIAGNOSIS — Z79899 Other long term (current) drug therapy: Secondary | ICD-10-CM | POA: Diagnosis not present

## 2022-10-23 DIAGNOSIS — R059 Cough, unspecified: Secondary | ICD-10-CM | POA: Diagnosis not present

## 2022-10-23 DIAGNOSIS — I272 Pulmonary hypertension, unspecified: Secondary | ICD-10-CM | POA: Diagnosis not present

## 2022-10-23 DIAGNOSIS — Z8249 Family history of ischemic heart disease and other diseases of the circulatory system: Secondary | ICD-10-CM

## 2022-10-23 LAB — CBC WITH DIFFERENTIAL/PLATELET
Abs Immature Granulocytes: 0.08 10*3/uL — ABNORMAL HIGH (ref 0.00–0.07)
Basophils Absolute: 0 10*3/uL (ref 0.0–0.1)
Basophils Relative: 0 %
Eosinophils Absolute: 0 10*3/uL (ref 0.0–0.5)
Eosinophils Relative: 0 %
HCT: 33.4 % — ABNORMAL LOW (ref 39.0–52.0)
Hemoglobin: 10.6 g/dL — ABNORMAL LOW (ref 13.0–17.0)
Immature Granulocytes: 1 %
Lymphocytes Relative: 3 %
Lymphs Abs: 0.4 10*3/uL — ABNORMAL LOW (ref 0.7–4.0)
MCH: 30.8 pg (ref 26.0–34.0)
MCHC: 31.7 g/dL (ref 30.0–36.0)
MCV: 97.1 fL (ref 80.0–100.0)
Monocytes Absolute: 1.8 10*3/uL — ABNORMAL HIGH (ref 0.1–1.0)
Monocytes Relative: 11 %
Neutro Abs: 14.1 10*3/uL — ABNORMAL HIGH (ref 1.7–7.7)
Neutrophils Relative %: 85 %
Platelets: 290 10*3/uL (ref 150–400)
RBC: 3.44 MIL/uL — ABNORMAL LOW (ref 4.22–5.81)
RDW: 14.1 % (ref 11.5–15.5)
WBC: 16.5 10*3/uL — ABNORMAL HIGH (ref 4.0–10.5)
nRBC: 0 % (ref 0.0–0.2)

## 2022-10-23 LAB — COMPREHENSIVE METABOLIC PANEL
ALT: 22 U/L (ref 0–44)
AST: 29 U/L (ref 15–41)
Albumin: 4.1 g/dL (ref 3.5–5.0)
Alkaline Phosphatase: 72 U/L (ref 38–126)
Anion gap: 9 (ref 5–15)
BUN: 23 mg/dL (ref 8–23)
CO2: 23 mmol/L (ref 22–32)
Calcium: 10.2 mg/dL (ref 8.9–10.3)
Chloride: 108 mmol/L (ref 98–111)
Creatinine, Ser: 1.39 mg/dL — ABNORMAL HIGH (ref 0.61–1.24)
GFR, Estimated: 55 mL/min — ABNORMAL LOW (ref >60)
Glucose, Bld: 180 mg/dL — ABNORMAL HIGH (ref 70–99)
Potassium: 4.1 mmol/L (ref 3.5–5.1)
Sodium: 140 mmol/L (ref 135–145)
Total Bilirubin: 0.4 mg/dL (ref 0.3–1.2)
Total Protein: 8.3 g/dL — ABNORMAL HIGH (ref 6.5–8.1)

## 2022-10-23 LAB — RESP PANEL BY RT-PCR (RSV, FLU A&B, COVID)  RVPGX2
Influenza A by PCR: NEGATIVE
Influenza B by PCR: NEGATIVE
Resp Syncytial Virus by PCR: POSITIVE — AB
SARS Coronavirus 2 by RT PCR: NEGATIVE

## 2022-10-23 LAB — TROPONIN I (HIGH SENSITIVITY)
Troponin I (High Sensitivity): 14 ng/L (ref <18)
Troponin I (High Sensitivity): 14 ng/L (ref <18)

## 2022-10-23 LAB — BRAIN NATRIURETIC PEPTIDE: B Natriuretic Peptide: 48.2 pg/mL (ref 0.0–100.0)

## 2022-10-23 LAB — PROCALCITONIN: Procalcitonin: 0.58 ng/mL

## 2022-10-23 MED ORDER — ACETAMINOPHEN 325 MG PO TABS: 650.0000 mg | ORAL_TABLET | Freq: Four times a day (QID) | ORAL | Status: DC | PRN

## 2022-10-23 MED ORDER — ROPINIROLE HCL 1 MG PO TABS
1.0000 mg | ORAL_TABLET | Freq: Every day | ORAL | Status: DC
  Administered 2022-10-23 – 2022-10-24 (×2): 1 mg via ORAL
  Filled 2022-10-23 (×2): qty 1

## 2022-10-23 MED ORDER — BENZONATATE 100 MG PO CAPS
100.0000 mg | ORAL_CAPSULE | Freq: Three times a day (TID) | ORAL | Status: DC
  Administered 2022-10-23 – 2022-10-25 (×6): 100 mg via ORAL
  Filled 2022-10-23 (×6): qty 1

## 2022-10-23 MED ORDER — SODIUM CHLORIDE 0.9 % IV SOLN
2.0000 g | Freq: Once | INTRAVENOUS | Status: AC
  Administered 2022-10-23: 2 g via INTRAVENOUS
  Filled 2022-10-23: qty 20

## 2022-10-23 MED ORDER — LEVETIRACETAM 250 MG PO TABS
250.0000 mg | ORAL_TABLET | Freq: Two times a day (BID) | ORAL | Status: DC
  Administered 2022-10-23 – 2022-10-25 (×4): 250 mg via ORAL
  Filled 2022-10-23 (×4): qty 1

## 2022-10-23 MED ORDER — AMLODIPINE BESYLATE 10 MG PO TABS
10.0000 mg | ORAL_TABLET | Freq: Every day | ORAL | Status: DC
  Administered 2022-10-24 – 2022-10-25 (×2): 10 mg via ORAL
  Filled 2022-10-23 (×2): qty 1

## 2022-10-23 MED ORDER — POLYETHYLENE GLYCOL 3350 17 G PO PACK: 17.0000 g | PACK | Freq: Every day | ORAL | Status: DC | PRN

## 2022-10-23 MED ORDER — GUAIFENESIN ER 600 MG PO TB12
600.0000 mg | ORAL_TABLET | Freq: Two times a day (BID) | ORAL | Status: DC
  Administered 2022-10-23 – 2022-10-25 (×4): 600 mg via ORAL
  Filled 2022-10-23 (×4): qty 1

## 2022-10-23 MED ORDER — IPRATROPIUM-ALBUTEROL 0.5-2.5 (3) MG/3ML IN SOLN
3.0000 mL | RESPIRATORY_TRACT | Status: DC
  Administered 2022-10-23: 3 mL via RESPIRATORY_TRACT
  Filled 2022-10-23 (×2): qty 3

## 2022-10-23 MED ORDER — CARVEDILOL 12.5 MG PO TABS
12.5000 mg | ORAL_TABLET | Freq: Two times a day (BID) | ORAL | Status: DC
  Administered 2022-10-23 – 2022-10-25 (×4): 12.5 mg via ORAL
  Filled 2022-10-23 (×4): qty 1

## 2022-10-23 MED ORDER — FLUTICASONE PROPIONATE 50 MCG/ACT NA SUSP
2.0000 | Freq: Every day | NASAL | Status: DC
  Administered 2022-10-23 – 2022-10-25 (×3): 2 via NASAL
  Filled 2022-10-23: qty 16

## 2022-10-23 MED ORDER — METHYLPREDNISOLONE SODIUM SUCC 125 MG IJ SOLR
125.0000 mg | Freq: Once | INTRAMUSCULAR | Status: AC
  Administered 2022-10-23: 125 mg via INTRAVENOUS
  Filled 2022-10-23: qty 2

## 2022-10-23 MED ORDER — GABAPENTIN 300 MG PO CAPS
300.0000 mg | ORAL_CAPSULE | Freq: Three times a day (TID) | ORAL | Status: DC
  Administered 2022-10-23 – 2022-10-25 (×6): 300 mg via ORAL
  Filled 2022-10-23 (×6): qty 1

## 2022-10-23 MED ORDER — HYDROCOD POLI-CHLORPHE POLI ER 10-8 MG/5ML PO SUER
5.0000 mL | Freq: Two times a day (BID) | ORAL | Status: DC | PRN
  Administered 2022-10-24: 5 mL via ORAL
  Filled 2022-10-23: qty 5

## 2022-10-23 MED ORDER — SODIUM CHLORIDE 0.9 % IV SOLN
500.0000 mg | Freq: Once | INTRAVENOUS | Status: AC
  Administered 2022-10-23: 500 mg via INTRAVENOUS
  Filled 2022-10-23: qty 5

## 2022-10-23 MED ORDER — AZITHROMYCIN 250 MG PO TABS
500.0000 mg | ORAL_TABLET | Freq: Every day | ORAL | Status: DC
  Administered 2022-10-24 – 2022-10-25 (×2): 500 mg via ORAL
  Filled 2022-10-23 (×2): qty 2

## 2022-10-23 MED ORDER — RIOCIGUAT 1.5 MG PO TABS
1.5000 mg | ORAL_TABLET | Freq: Three times a day (TID) | ORAL | Status: DC
  Administered 2022-10-24 – 2022-10-25 (×4): 1.5 mg via ORAL
  Filled 2022-10-23 (×2): qty 1

## 2022-10-23 MED ORDER — SPIRONOLACTONE 25 MG PO TABS
25.0000 mg | ORAL_TABLET | Freq: Every day | ORAL | Status: DC
  Administered 2022-10-24 – 2022-10-25 (×2): 25 mg via ORAL
  Filled 2022-10-23 (×2): qty 1

## 2022-10-23 MED ORDER — TRAZODONE HCL 50 MG PO TABS: 50.0000 mg | ORAL_TABLET | Freq: Every evening | ORAL | Status: DC | PRN

## 2022-10-23 MED ORDER — DULOXETINE HCL 30 MG PO CPEP
60.0000 mg | ORAL_CAPSULE | Freq: Every day | ORAL | Status: DC
  Administered 2022-10-24 – 2022-10-25 (×2): 60 mg via ORAL
  Filled 2022-10-23 (×2): qty 2

## 2022-10-23 MED ORDER — HYDROCODONE-ACETAMINOPHEN 10-325 MG PO TABS
1.0000 | ORAL_TABLET | Freq: Four times a day (QID) | ORAL | Status: DC | PRN
  Administered 2022-10-23 – 2022-10-24 (×3): 1 via ORAL
  Filled 2022-10-23 (×3): qty 1

## 2022-10-23 MED ORDER — SODIUM CHLORIDE 0.9 % IV SOLN
1.0000 g | INTRAVENOUS | Status: DC
  Administered 2022-10-24 – 2022-10-25 (×2): 1 g via INTRAVENOUS
  Filled 2022-10-23 (×2): qty 10

## 2022-10-23 MED ORDER — ALBUTEROL SULFATE (2.5 MG/3ML) 0.083% IN NEBU: 2.5000 mg | INHALATION_SOLUTION | RESPIRATORY_TRACT | Status: DC | PRN

## 2022-10-23 MED ORDER — ONDANSETRON HCL 4 MG PO TABS: 4.0000 mg | ORAL_TABLET | Freq: Four times a day (QID) | ORAL | Status: DC | PRN

## 2022-10-23 MED ORDER — ROSUVASTATIN CALCIUM 10 MG PO TABS
20.0000 mg | ORAL_TABLET | Freq: Every day | ORAL | Status: DC
  Administered 2022-10-24 – 2022-10-25 (×2): 20 mg via ORAL
  Filled 2022-10-23 (×2): qty 2

## 2022-10-23 MED ORDER — PREDNISONE 50 MG PO TABS
50.0000 mg | ORAL_TABLET | Freq: Every day | ORAL | Status: DC
  Administered 2022-10-25: 50 mg via ORAL
  Filled 2022-10-23: qty 1

## 2022-10-23 MED ORDER — IPRATROPIUM-ALBUTEROL 0.5-2.5 (3) MG/3ML IN SOLN
3.0000 mL | Freq: Four times a day (QID) | RESPIRATORY_TRACT | Status: DC
  Administered 2022-10-24: 3 mL via RESPIRATORY_TRACT
  Filled 2022-10-23: qty 3

## 2022-10-23 MED ORDER — TORSEMIDE 20 MG PO TABS
20.0000 mg | ORAL_TABLET | Freq: Every day | ORAL | Status: DC
  Administered 2022-10-24 – 2022-10-25 (×2): 20 mg via ORAL
  Filled 2022-10-23 (×2): qty 1

## 2022-10-23 MED ORDER — ACETAMINOPHEN 650 MG RE SUPP: 650.0000 mg | Freq: Four times a day (QID) | RECTAL | Status: DC | PRN

## 2022-10-23 MED ORDER — ALBUTEROL SULFATE HFA 108 (90 BASE) MCG/ACT IN AERS
INHALATION_SPRAY | RESPIRATORY_TRACT | Status: AC
  Filled 2022-10-23: qty 6.7

## 2022-10-23 MED ORDER — IPRATROPIUM-ALBUTEROL 0.5-2.5 (3) MG/3ML IN SOLN: 3.0000 mL | Freq: Four times a day (QID) | RESPIRATORY_TRACT | Status: DC

## 2022-10-23 MED ORDER — ONDANSETRON HCL 4 MG/2ML IJ SOLN: 4.0000 mg | Freq: Four times a day (QID) | INTRAMUSCULAR | Status: DC | PRN

## 2022-10-23 MED ORDER — VITAMIN B-12 1000 MCG PO TABS
1000.0000 ug | ORAL_TABLET | Freq: Every day | ORAL | Status: DC
  Administered 2022-10-24 – 2022-10-25 (×2): 1000 ug via ORAL
  Filled 2022-10-23 (×2): qty 1

## 2022-10-23 MED ORDER — IPRATROPIUM-ALBUTEROL 0.5-2.5 (3) MG/3ML IN SOLN
3.0000 mL | Freq: Once | RESPIRATORY_TRACT | Status: AC
  Administered 2022-10-23: 3 mL via RESPIRATORY_TRACT
  Filled 2022-10-23: qty 3

## 2022-10-23 MED ORDER — METHYLPREDNISOLONE SODIUM SUCC 40 MG IJ SOLR
40.0000 mg | Freq: Two times a day (BID) | INTRAMUSCULAR | Status: AC
  Administered 2022-10-23 – 2022-10-24 (×3): 40 mg via INTRAVENOUS
  Filled 2022-10-23 (×3): qty 1

## 2022-10-23 MED ORDER — IOHEXOL 350 MG/ML SOLN
100.0000 mL | Freq: Once | INTRAVENOUS | Status: AC | PRN
  Administered 2022-10-23: 100 mL via INTRAVENOUS

## 2022-10-23 MED ORDER — APIXABAN 5 MG PO TABS
5.0000 mg | ORAL_TABLET | Freq: Two times a day (BID) | ORAL | Status: DC
  Administered 2022-10-23 – 2022-10-25 (×4): 5 mg via ORAL
  Filled 2022-10-23 (×4): qty 1

## 2022-10-23 NOTE — Progress Notes (Signed)
Plan of Care Note for accepted transfer   Patient: Keith [name] MRN: [id number]   [place]: 10/23/2022  Facility requesting transfer:  Requesting Provider: Deno Etienne, DO Reason for transfer: Acute respiratory failure due to RSV. Facility course:  Per Dr. Tyrone Nine:  "72 yo M with a chief complaints of cough congestion going on for a couple weeks now. He has got worse over the past 5 days. Feels much more short of breath than normal. Has a history of a PE and is on Eliquis. Denies any missed doses. No chest pain. History of heart failure."  Plan of care: The patient is accepted due to acute respiratory failure with hypoxia secondary to RSV for admission to Telemetry unit, at [facility].  Author: Reubin [place], MD 10/23/2022  Check [email or web address] for on-call coverage.  Nursing staff, Please call [place] number on Amion as soon as patient's arrival, so appropriate admitting provider can evaluate the pt.

## 2022-10-23 NOTE — ED Notes (Signed)
Limited exertion, pt to bed, pt became increase SHOB; room air sats noted 84-86%, pt placed on 2L, RT to bedside.

## 2022-10-23 NOTE — H&P (Signed)
History and Physical  Patient: Keith [name]:[id number] DOB: 1950-10-21 DOA: 10/23/2022 DOS: the patient was seen and examined on 10/23/2022 Patient coming from: Home  Chief Complaint:  Chief Complaint  Patient presents with   Shortness of Breath   URI        HPI: Keith [name] is a 72 y.o. male with PMH significant of HTN, HLD, pulmonary hypertension, positive rheumatoid factor, PE on anticoagulation, lumbar stenosis with multiple back surgeries, chronic pain, obesity.  Presented to the hospital with shortness of breath and cough. Symptoms likely have been ongoing since 2 weeks.  He never had any fever.  Continues to have cough and congestion.  Denies any nausea or vomiting.  Has taken some over-the-counter medication to help with the situation. Denies any chest pain but does have some chest heaviness which she developed since last few days and is progressively worsening. He has chronic swelling of his legs due to his pulmonary hypertension and CHF. Takes medications and compliant with all medications that he is supposed to take. Does not smoke. No exposure to chemicals or fumes.  Review of Systems: As mentioned in the history of present illness. All other systems reviewed and are negative. Past Medical History:  Diagnosis Date   Allergy    Chronic back pain    Diabetes mellitus without complication ([place])    DNR (do not resuscitate) 11/19/2020   Duodenal ulcer hemorrhage 08/29/2014   ED (erectile dysfunction)    Esophageal stricture 08/30/2014   Glucose intolerance (impaired glucose tolerance)    Hiatal hernia 08/30/2014   Hypertension    Hypokalemia 04/11/2013   MRSA carrier 08/30/2014   Obesity    Osteoarthritis    Pneumonia    Pulmonary embolism ([place]) 06/2020   Scoliosis 2016   Spinal stenosis of lumbar region    Urinary tract infection 04/11/2013   Past Surgical History:  Procedure Laterality Date   BACK SURGERY  08/07/2021   COLONOSCOPY  2008    ESOPHAGOGASTRODUODENOSCOPY N/A 08/29/2014   Procedure: ESOPHAGOGASTRODUODENOSCOPY (EGD);  Surgeon: Lafayette Dragon, MD;  Location: [facility];  Service: Endoscopy;  Laterality: N/A;   LAMINECTOMY N/A 07/28/2018   Procedure: THORACIC ELEVEN- THORACIC TWELVE POSTERIOR DECOMPRESSION LAMINECTOMY;  Surgeon: Judith Part, MD;  Location: [facility];  Service: Neurosurgery;  Laterality: N/A;   LUMBAR LAMINECTOMY     NASAL POLYP SURGERY     ORIF ANKLE FRACTURE Right 07/01/2020   Procedure: OPEN REDUCTION INTERNAL FIXATION (ORIF) ANKLE FRACTURE. TRIMALLEOLAR;  Surgeon: Marchia Bond, MD;  Location: [place] [facility];  Service: Orthopedics;  Laterality: Right;   RIGHT HEART CATH N/A 07/06/2022   Procedure: RIGHT HEART CATH;  Surgeon: Larey Dresser, MD;  Location: [facility];  Service: Cardiovascular;  Laterality: N/A;   RIGHT/LEFT HEART CATH AND CORONARY ANGIOGRAPHY N/A 03/02/2022   Procedure: RIGHT/LEFT HEART CATH AND CORONARY ANGIOGRAPHY;  Surgeon: Dixie Dials, MD;  Location: [facility];  Service: Cardiovascular;  Laterality: N/A;   TONSILLECTOMY     Social History:  reports that he quit smoking about 29 years ago. His smoking use included cigarettes. He has a 25.00 pack-year smoking history. He has never used smokeless tobacco. He reports that he does not currently use alcohol. He reports current drug use. Drug: Oxycodone. Allergies  Allergen Reactions   Aspirin Other (See Comments)    Irritates the stomach and the patient developed ulcers, also   Lisinopril Other (See Comments)    Caused a body ache   Family  History  Problem Relation Age of Onset   Diabetes Mother    Asthma Mother    Hypertension Father    Stroke Father    Colon cancer Neg Hx    Prior to Admission medications   Medication Sig Start Date End Date Taking? Authorizing Provider  acetaminophen (TYLENOL) 325 MG tablet Take 1-2 tablets (325-650 mg total) by mouth every 4 (four) hours as needed for mild pain. 09/06/21  Yes  Love, Ivan Anchors, PA-C  amLODipine (NORVASC) 10 MG tablet Take 1 tablet (10 mg total) by mouth daily. 06/06/22  Yes [name], [name], MD  apixaban (ELIQUIS) 5 MG TABS tablet Take 1 tablet (5 mg total) by mouth 2 (two) times daily. 03/09/22  Yes Nita Sells, MD  carvedilol (COREG) 12.5 MG tablet Take 1 tablet (12.5 mg total) by mouth 2 (two) times daily with a meal. 06/25/22  Yes Larey Dresser, MD  cyanocobalamin (VITAMIN B12) 1000 MCG tablet Take 1,000 mcg by mouth daily.   Yes [provider]  DULoxetine (CYMBALTA) 60 MG capsule TAKE 1 CAPSULE BY MOUTH EVERY DAY 07/03/22  Yes Lovorn, Jinny Blossom, MD  ferrous sulfate 325 (65 FE) MG EC tablet Take 325 mg by mouth every other day.   Yes [provider]  gabapentin (NEURONTIN) 300 MG capsule Take 300 mg by mouth 3 (three) times daily.   Yes [provider]  HYDROcodone-acetaminophen (NORCO) 10-325 MG tablet Take 1 tablet by mouth in the morning, at noon, and at bedtime.   Yes [provider]  hydrocortisone (ANUSOL-HC) 2.5 % rectal cream Place 1 application. rectally as needed for hemorrhoids or anal itching.   Yes [provider]  levETIRAcetam (KEPPRA) 250 MG tablet TAKE 1 TABLET BY MOUTH TWICE A DAY 06/12/22  Yes Lovorn, Megan, MD  lidocaine (LIDODERM) 5 % Place 1 patch onto the skin daily as needed (pain). Remove & Discard patch within 12 hours or as directed by MD   Yes [provider]  naloxone (NARCAN) nasal spray 4 mg/0.1 mL Place 1 spray into the nose as needed (accidental overdose). 09/06/21  Yes [provider]  polyethylene glycol (MIRALAX / GLYCOLAX) 17 g packet Take 17 g by mouth daily as needed for mild constipation. 09/06/21  Yes Love, Ivan Anchors, PA-C  Riociguat (ADEMPAS) 1.5 MG TABS Take 1.5 mg by mouth in the morning, at noon, and at bedtime.   Yes [provider]  rOPINIRole (REQUIP) 1 MG tablet Take 1 tablet (1 mg total) by mouth at bedtime. 07/18/22  Yes [name], Betty  G, MD  rosuvastatin (CRESTOR) 20 MG tablet Take 1 tablet (20 mg total) by mouth daily. 05/16/22  Yes [name], [name], MD  spironolactone (ALDACTONE) 25 MG tablet Take 1 tablet (25 mg total) by mouth daily. 07/26/22  Yes Larey Dresser, MD  torsemide (DEMADEX) 20 MG tablet Take 1 tablet (20 mg total) by mouth daily. 08/06/22  Yes Larey Dresser, MD  traZODone (DESYREL) 50 MG tablet TAKE 1/2 TO 1 TABLET BY MOUTH AT BEDTIME AS NEEDED FOR SLEEP 06/12/22  Yes Lovorn, Megan, MD  Blood Glucose Monitoring Suppl (ACCU-CHEK GUIDE ME) w/Device KIT USE TO TEST BLOOD SUGARS 1-2 TIMES DAILY. 03/06/22   [name], [name], MD   Physical Exam: Vitals:   10/23/22 1130 10/23/22 1200 10/23/22 1413 10/23/22 1615  BP: 118/67 108/67 120/70 122/75  Pulse: 100 (!) 101 99 (!) 104  Resp: (!) 22 (!) '9 15 20  '$ Temp:   98.2 F (  36.8 C) 98.4 F (36.9 C)  TempSrc:   Oral Oral  SpO2: 94% 90% 95% 96%  Weight:      Height:       General: Appear in mild distress; no visible Abnormal Neck Mass Or lumps, Conjunctiva normal Cardiovascular: S1 and S2 Present, no Murmur, Respiratory: good respiratory effort, Bilateral Air entry present and no Crackles, bilateral  wheezes Abdomen: Bowel Sound present, Non tender  Extremities: bilateral  Pedal edema Neurology: alert and oriented to time, place, and person Gait not checked due to patient safety concerns   Data Reviewed: I have reviewed ED notes, Vitals, Lab results and outpatient records. Since last encounter, pertinent lab results CBC and BMP   . I have ordered test including procalcitonin CBC and BMP  . I have independently visualized and interpreted imaging chest x-ray and a CT PE protocol which showed multifocal pneumonia  .   Assessment and Plan Acute hypoxic respiratory failure. RSV infection. Possible community-acquired pneumonia. Patient is significant leukocytosis. Presents with complaints of cough and shortness of breath which actually worsening for last few days  while he has cough and shortness of breath and congestion ongoing for last 2 weeks. Also has some chest tightness. Possibility of a primary RSV infection with secondary bacterial pneumonia cannot be ruled out. Procalcitonin ordered. Continue DuoNebs, continuous coughs suppression medications.  Add steroids. Monitor. If the procalcitonin is elevated.  Will add antibiotics as well. Blood cultures ordered.  HTN. Blood pressure stable. Continue home regimen.  Pulmonary hypertension. Outpatient follows up with Dr. Kirk Ruths. Continue home regimen.  Positive rheumatoid factor. Positive ANA. Patient will be seeing rheumatologist outpatient.  Chronic pain syndrome. Multiple lumbar surgeries. Continue gabapentin and Norco.  Insomnia. Continue home regimen.  Advance Care Planning:   Code Status: Full Code patient had prior Evidence of being DNR.  But currently wants to be full code. Consults: None Family Communication: None at bedside  Author: Berle Mull, MD 10/23/2022 7:25 PM For on call review [email or web address].

## 2022-10-23 NOTE — ED Triage Notes (Addendum)
Pt to ED c/o ongoing cold symptoms for 2 weeks, progressively getting worse. SHOB, Cough, congestion, Minimal relief with OTC medications, Increase SHOB with exertion . HX CHF

## 2022-10-23 NOTE — ED Provider Notes (Signed)
[facility] EMERGENCY DEPT Provider Note   CSN: [id number] Arrival date & time: 10/23/22  4097     History  Chief Complaint  Patient presents with   Shortness of Breath   URI         Keith [name] is a 72 y.o. male.  72 yo M with a chief complaints of cough congestion going on for a couple weeks now.  He has got worse over the past 5 days.  Feels much more short of breath than normal.  Has a history of a PE and is on Eliquis.  Denies any missed doses.  No chest pain.  History of heart failure.   Shortness of Breath URI      Home Medications Prior to Admission medications   Medication Sig Start Date End Date Taking? Authorizing Provider  acetaminophen (TYLENOL) 325 MG tablet Take 1-2 tablets (325-650 mg total) by mouth every 4 (four) hours as needed for mild pain. 09/06/21  Yes Love, Ivan Anchors, PA-C  amLODipine (NORVASC) 10 MG tablet Take 1 tablet (10 mg total) by mouth daily. 06/06/22  Yes [name], [name], MD  apixaban (ELIQUIS) 5 MG TABS tablet Take 1 tablet (5 mg total) by mouth 2 (two) times daily. 03/09/22  Yes Nita Sells, MD  carvedilol (COREG) 12.5 MG tablet Take 1 tablet (12.5 mg total) by mouth 2 (two) times daily with a meal. 06/25/22  Yes Larey Dresser, MD  cyanocobalamin (VITAMIN B12) 1000 MCG tablet Take 1,000 mcg by mouth daily.   Yes [provider]  DULoxetine (CYMBALTA) 60 MG capsule TAKE 1 CAPSULE BY MOUTH EVERY DAY 07/03/22  Yes Lovorn, Jinny Blossom, MD  ferrous sulfate 325 (65 FE) MG EC tablet Take 325 mg by mouth every other day.   Yes [provider]  gabapentin (NEURONTIN) 300 MG capsule Take 300 mg by mouth 3 (three) times daily.   Yes [provider]  HYDROcodone-acetaminophen (NORCO) 10-325 MG tablet Take 1 tablet by mouth in the morning, at noon, and at bedtime.   Yes [provider]  hydrocortisone (ANUSOL-HC) 2.5 % rectal cream Place 1 application. rectally as needed for hemorrhoids or anal  itching.   Yes [provider]  levETIRAcetam (KEPPRA) 250 MG tablet TAKE 1 TABLET BY MOUTH TWICE A DAY 06/12/22  Yes Lovorn, Megan, MD  lidocaine (LIDODERM) 5 % Place 1 patch onto the skin daily as needed (pain). Remove & Discard patch within 12 hours or as directed by MD   Yes [provider]  naloxone (NARCAN) nasal spray 4 mg/0.1 mL Place 1 spray into the nose as needed (accidental overdose). 09/06/21  Yes [provider]  polyethylene glycol (MIRALAX / GLYCOLAX) 17 g packet Take 17 g by mouth daily as needed for mild constipation. 09/06/21  Yes Love, Ivan Anchors, PA-C  Riociguat (ADEMPAS) 1.5 MG TABS Take 1.5 mg by mouth in the morning, at noon, and at bedtime.   Yes [provider]  rOPINIRole (REQUIP) 1 MG tablet Take 1 tablet (1 mg total) by mouth at bedtime. 07/18/22  Yes [name], [name], MD  rosuvastatin (CRESTOR) 20 MG tablet Take 1 tablet (20 mg total) by mouth daily. 05/16/22  Yes [name], [name], MD  spironolactone (ALDACTONE) 25 MG tablet Take 1 tablet (25 mg total) by mouth daily. 07/26/22  Yes Larey Dresser, MD  torsemide (DEMADEX) 20 MG tablet Take 1 tablet (20 mg total) by mouth daily. 08/06/22  Yes Larey Dresser, MD  traZODone (  DESYREL) 50 MG tablet TAKE 1/2 TO 1 TABLET BY MOUTH AT BEDTIME AS NEEDED FOR SLEEP 06/12/22  Yes Lovorn, Megan, MD  Blood Glucose Monitoring Suppl (ACCU-CHEK GUIDE ME) w/Device KIT USE TO TEST BLOOD SUGARS 1-2 TIMES DAILY. 03/06/22   [name], [name], MD      Allergies    Aspirin and Lisinopril    Review of Systems   Review of Systems  Respiratory:  Positive for shortness of breath.     Physical Exam Updated Vital Signs BP 120/70 (BP Location: Right Arm)   Pulse 99   Temp 98.2 F (36.8 C) (Oral)   Resp 15   Ht 6' (1.829 m)   Wt 123.8 kg   SpO2 95%   BMI 37.03 kg/m  Physical Exam Vitals and nursing note reviewed.  Constitutional:      Appearance: He is well-developed.  HENT:     Head: Normocephalic and  atraumatic.  Eyes:     Pupils: Pupils are equal, round, and reactive to light.  Neck:     Vascular: No JVD.  Cardiovascular:     Rate and Rhythm: Normal rate and regular rhythm.     Heart sounds: No murmur heard.    No friction rub. No gallop.  Pulmonary:     Effort: No respiratory distress.     Breath sounds: Wheezing present.     Comments: Diffuse wheezes worse lower than upper. Abdominal:     General: There is no distension.     Tenderness: There is no abdominal tenderness. There is no guarding or rebound.  Musculoskeletal:        General: Normal range of motion.     Cervical back: Normal range of motion and neck supple.  Skin:    Coloration: Skin is not pale.     Findings: No rash.  Neurological:     Mental Status: He is alert and oriented to person, place, and time.  Psychiatric:        Behavior: Behavior normal.     ED Results / Procedures / Treatments   Labs (all labs ordered are listed, but only abnormal results are displayed) Labs Reviewed  RESP PANEL BY RT-PCR (RSV, FLU A&B, COVID)  RVPGX2 - Abnormal; Notable for the following components:      Result Value   Resp Syncytial Virus by PCR POSITIVE (*)    All other components within normal limits  CBC WITH DIFFERENTIAL/PLATELET - Abnormal; Notable for the following components:   WBC 16.5 (*)    RBC 3.44 (*)    Hemoglobin 10.6 (*)    HCT 33.4 (*)    Neutro Abs 14.1 (*)    Lymphs Abs 0.4 (*)    Monocytes Absolute 1.8 (*)    Abs Immature Granulocytes 0.08 (*)    All other components within normal limits  COMPREHENSIVE METABOLIC PANEL - Abnormal; Notable for the following components:   Glucose, Bld 180 (*)    Creatinine, Ser 1.39 (*)    Total Protein 8.3 (*)    GFR, Estimated 55 (*)    All other components within normal limits  CULTURE, BLOOD (ROUTINE X 2)  CULTURE, BLOOD (ROUTINE X 2)  BRAIN NATRIURETIC PEPTIDE  TROPONIN I (HIGH SENSITIVITY)  TROPONIN I (HIGH SENSITIVITY)    EKG EKG  Interpretation  Date/Time:  Tuesday October 23 2022 09:14:20 EST Ventricular Rate:  106 PR Interval:  200 QRS Duration: 91 QT Interval:  338 QTC Calculation: 449 R Axis:   -63 Text Interpretation: Sinus  tachycardia Atrial premature complexes Left anterior fascicular block Abnormal R-wave progression, late transition No significant change since last tracing Confirmed by Deno Etienne [phone]) on 10/23/2022 9:19:29 AM  Radiology CT Angio Chest PE W and/or Wo Contrast  Result Date: 10/23/2022 CLINICAL DATA:  Progressively worsening cough, congestion, and shortness of breath over the past 2 weeks. EXAM: CT ANGIOGRAPHY CHEST WITH CONTRAST TECHNIQUE: Multidetector CT imaging of the chest was performed using the standard protocol during bolus administration of intravenous contrast. Multiplanar CT image reconstructions and MIPs were obtained to evaluate the vascular anatomy. RADIATION DOSE REDUCTION: This exam was performed according to the departmental dose-optimization program which includes automated exposure control, adjustment of the mA and/or kV according to patient size and/or use of iterative reconstruction technique. CONTRAST:  120m OMNIPAQUE IOHEXOL 350 MG/ML SOLN COMPARISON:  Chest x-ray from same day. CT chest dated December 20, 2020. FINDINGS: Cardiovascular: Satisfactory opacification of the pulmonary arteries to the segmental level. No evidence of pulmonary embolism. Normal heart size. No pericardial effusion. No thoracic aortic aneurysm or dissection. Coronary, aortic arch, and branch vessel atherosclerotic vascular disease. Mediastinum/Nodes: No enlarged mediastinal, hilar, or axillary lymph nodes. Thyroid gland, trachea, and esophagus demonstrate no significant findings. Lungs/Pleura: Mild central peribronchial thickening. Patchy consolidation and irregular peribronchovascular ground-glass densities in the left lower lobe. There are a few small irregular peribronchovascular ground-glass densities in  the left upper lobe and right lower lobe. No pleural effusion or pneumothorax. Unchanged 5 mm left upper lobe nodule (series 6, image 63). Unchanged 3 mm right middle lobe nodule (series 6, image 69). These are considered benign given stability. No follow-up imaging is recommended. Upper Abdomen: No acute abnormality. Unchanged left adrenal adenoma. No follow-up imaging is recommended. Musculoskeletal: No chest wall abnormality. Interval T11-T12 posterior fusion. Lucency around the right T11 pedicle screw. The left T11 pedicle screw has minimal vertebral body purchase and mostly extends into the T11-T12 disc space. The right T12 pedicle screw extends along the lateral aspect of the vertebral body with minimal bony purchase. Review of the MIP images confirms the above findings. IMPRESSION: 1. No evidence of pulmonary embolism. 2. Findings consistent with multifocal bronchopneumonia, worse in the left lower lobe. 3. Interval T11-T12 posterior fusion with loosening of the right T11 pedicle screw and poor bony purchase of the left T11 and right T12 pedicle screws as described above. 4.  Aortic atherosclerosis (ICD10-I70.0). Electronically Signed   By: WTitus DubinM.D.   On: 10/23/2022 11:41   DG Chest Port 1 View  Result Date: 10/23/2022 CLINICAL DATA:  Shortness of breath. Ongoing cold symptoms for 2 weeks, progressively getting worse. Cough and congestion. EXAM: PORTABLE CHEST 1 VIEW COMPARISON:  Chest radiograph 02/28/2022. FINDINGS: Right lung is clear. Questionable patchy left retrocardiac opacity. Stable cardiomegaly and mediastinal contours with tortuosity of the aorta, likely exaggerated by scoliotic curvature of the mid and lower thoracic spine. No pleural effusion or pneumothorax. IMPRESSION: Query patchy left retrocardiac opacity. Consider PA and lateral radiographs for further characterization. Electronically Signed   By: WEmmit AlexandersM.D   On: 10/23/2022 10:06    Procedures .Critical  Care  Performed by: FDeno Etienne DO Authorized by: FDeno Etienne DO   Critical care provider statement:    Critical care time (minutes):  35   Critical care time was exclusive of:  Separately billable procedures and treating other patients   Critical care was time spent personally by me on the following activities:  Development of treatment plan with patient or surrogate, discussions  with consultants, evaluation of patient's response to treatment, examination of patient, ordering and review of laboratory studies, ordering and review of radiographic studies, ordering and performing treatments and interventions, pulse oximetry, re-evaluation of patient's condition and review of old [place] discussed with: admitting provider       Medications Ordered in ED Medications  azithromycin (ZITHROMAX) 500 mg in sodium chloride 0.9 % 250 mL IVPB (500 mg Intravenous New Bag/Given 10/23/22 1417)  albuterol (VENTOLIN HFA) 108 (90 Base) MCG/ACT inhaler (  Given 10/23/22 0921)  iohexol (OMNIPAQUE) 350 MG/ML injection 100 mL (100 mLs Intravenous Contrast Given 10/23/22 1113)  ipratropium-albuterol (DUONEB) 0.5-2.5 (3) MG/3ML nebulizer solution 3 mL (3 mLs Nebulization Given 10/23/22 1130)  methylPREDNISolone sodium succinate (SOLU-MEDROL) 125 mg/2 mL injection 125 mg (125 mg Intravenous Given 10/23/22 1307)  cefTRIAXone (ROCEPHIN) 2 g in sodium chloride 0.9 % 100 mL IVPB (2 g Intravenous New Bag/Given 10/23/22 1308)    ED Course/ Medical Decision Making/ A&P                             Medical Decision Making Amount and/or Complexity of Data Reviewed Labs: ordered. Radiology: ordered.  Risk Prescription drug management. Decision regarding hospitalization.   72 yo M with a chief complaints of difficulty breathing.  The patient had become ill about 2 weeks ago and has had no improvement and then significant worsening over the past 5 days or so.  Has been taking multiple over-the-counter medications  without improvement.  History of PE in the past and is on Eliquis.  He is hypoxic here.  Not on oxygen at home.  Per the patient he was on oxygen for a period of time after being diagnosed with a pulmonary embolism.  Will obtain a laboratory evaluation and chest x-ray.  Chest x-ray independently interpreted by me without obvious focal infiltrate.  Radiology read concerning for possible pneumonia.  CT scan of the chest with multifocal pneumonia.  Leukocytosis.  RSV positive.  Discussed the case with medicine for admission.   The patients results and plan were reviewed and discussed.   Any x-rays performed were independently reviewed by myself.   Differential diagnosis were considered with the presenting HPI.  Medications  azithromycin (ZITHROMAX) 500 mg in sodium chloride 0.9 % 250 mL IVPB (500 mg Intravenous New Bag/Given 10/23/22 1417)  albuterol (VENTOLIN HFA) 108 (90 Base) MCG/ACT inhaler (  Given 10/23/22 0921)  iohexol (OMNIPAQUE) 350 MG/ML injection 100 mL (100 mLs Intravenous Contrast Given 10/23/22 1113)  ipratropium-albuterol (DUONEB) 0.5-2.5 (3) MG/3ML nebulizer solution 3 mL (3 mLs Nebulization Given 10/23/22 1130)  methylPREDNISolone sodium succinate (SOLU-MEDROL) 125 mg/2 mL injection 125 mg (125 mg Intravenous Given 10/23/22 1307)  cefTRIAXone (ROCEPHIN) 2 g in sodium chloride 0.9 % 100 mL IVPB (2 g Intravenous New Bag/Given 10/23/22 1308)    Vitals:   10/23/22 1103 10/23/22 1130 10/23/22 1200 10/23/22 1413  BP: 108/67 118/67 108/67 120/70  Pulse: 92 100 (!) 101 99  Resp: 16 (!) 22 (!) 9 15  Temp:    98.2 F (36.8 C)  TempSrc:    Oral  SpO2: 97% 94% 90% 95%  Weight:      Height:        Final diagnoses:  RSV (acute bronchiolitis due to respiratory syncytial virus)  Acute respiratory failure with hypoxia (HCC)    Admission/ observation were discussed with the admitting physician, patient and/or family and they are comfortable with  the plan.           Final  Clinical Impression(s) / ED Diagnoses Final diagnoses:  RSV (acute bronchiolitis due to respiratory syncytial virus)  Acute respiratory failure with hypoxia [facility])    Rx / DC Orders ED Discharge Orders     None         Deno Etienne, DO 10/23/22 1456

## 2022-10-24 DIAGNOSIS — J9601 Acute respiratory failure with hypoxia: Secondary | ICD-10-CM | POA: Diagnosis not present

## 2022-10-24 LAB — BASIC METABOLIC PANEL
Anion gap: 9 (ref 5–15)
BUN: 24 mg/dL — ABNORMAL HIGH (ref 8–23)
CO2: 20 mmol/L — ABNORMAL LOW (ref 22–32)
Calcium: 9.3 mg/dL (ref 8.9–10.3)
Chloride: 109 mmol/L (ref 98–111)
Creatinine, Ser: 1.24 mg/dL (ref 0.61–1.24)
GFR, Estimated: >60 mL/min (ref >60)
Glucose, Bld: 174 mg/dL — ABNORMAL HIGH (ref 70–99)
Potassium: 4.1 mmol/L (ref 3.5–5.1)
Sodium: 138 mmol/L (ref 135–145)

## 2022-10-24 LAB — CBC
HCT: 32.2 % — ABNORMAL LOW (ref 39.0–52.0)
Hemoglobin: 10.1 g/dL — ABNORMAL LOW (ref 13.0–17.0)
MCH: 30.9 pg (ref 26.0–34.0)
MCHC: 31.4 g/dL (ref 30.0–36.0)
MCV: 98.5 fL (ref 80.0–100.0)
Platelets: 303 10*3/uL (ref 150–400)
RBC: 3.27 MIL/uL — ABNORMAL LOW (ref 4.22–5.81)
RDW: 13.9 % (ref 11.5–15.5)
WBC: 12.7 10*3/uL — ABNORMAL HIGH (ref 4.0–10.5)
nRBC: 0 % (ref 0.0–0.2)

## 2022-10-24 LAB — PROCALCITONIN: Procalcitonin: 0.23 ng/mL

## 2022-10-24 MED ORDER — HYDRALAZINE HCL 20 MG/ML IJ SOLN: 10.0000 mg | INTRAMUSCULAR | Status: DC | PRN

## 2022-10-24 MED ORDER — IPRATROPIUM-ALBUTEROL 0.5-2.5 (3) MG/3ML IN SOLN
3.0000 mL | Freq: Three times a day (TID) | RESPIRATORY_TRACT | Status: DC
  Administered 2022-10-24 – 2022-10-25 (×3): 3 mL via RESPIRATORY_TRACT
  Filled 2022-10-24 (×3): qty 3

## 2022-10-24 MED ORDER — SENNOSIDES-DOCUSATE SODIUM 8.6-50 MG PO TABS: 1.0000 | ORAL_TABLET | Freq: Every evening | ORAL | Status: DC | PRN

## 2022-10-24 MED ORDER — METOPROLOL TARTRATE 5 MG/5ML IV SOLN: 5.0000 mg | INTRAVENOUS | Status: DC | PRN

## 2022-10-24 MED ORDER — IPRATROPIUM-ALBUTEROL 0.5-2.5 (3) MG/3ML IN SOLN: 3.0000 mL | RESPIRATORY_TRACT | Status: DC | PRN

## 2022-10-24 NOTE — Progress Notes (Signed)
Mobility Specialist - Progress Note   10/24/22 1033  Mobility  Activity Ambulated with assistance in hallway  Level of Assistance Contact guard assist, steadying assist  Assistive Device Front wheel walker  Distance Ambulated (ft) 5 ft  Activity Response Tolerated well  Mobility Referral Yes  $Mobility charge 1 Mobility   Pt received in bed and did not want to participate in walking O2 test nor ambulate at all with mobility. Pt claimed that his PCP did not want him ambulating or doing anything strenuous until he has majority of the fluid out of his lungs.   Convinced pt to sit up in chair to help coughing and took some ambulation O2 sats during however it was a short distance due to it being a transfer. Pt was Min A for sit to stand and bed mobility took few steps, shuffling like to chair baring most of his weight through his arms and the front wheel walker.   Nurse requested Mobility Specialist to perform oxygen saturation test with pt which includes removing pt from oxygen both at rest and while ambulating.  Below are the results from that testing.     Patient Saturations on Room Air at Rest = spO2 95%  Patient Saturations on Room Air while Ambulating = sp02 93% .   At end of testing pt left in room on 0 Liters of oxygen.  Reported results to nurse.   Pt in chair with all needs met.    Roderick Pee Mobility Specialist

## 2022-10-24 NOTE — Progress Notes (Signed)
PROGRESS NOTE    Keith [name]  JME:[id number] DOB: [birth year] DOA: 10/23/2022 PCP: [name], [name], MD   Brief Narrative:  72 year old with history of HTN, HLD, pulmonary hypertension, positive rheumatoid factor, PE on anticoagulation, lumbar stenosis status post multiple surgeries, obesity, chronic pain comes to the hospital with shortness of breath.  Patient was diagnosed with RSV infection with possible superimposed community-acquired pneumonia.   Assessment & Plan:  Principal Problem:   Acute respiratory failure with hypoxia (HCC) Active Problems:   Essential hypertension   Lumbar spinal stenosis   Chronic pain syndrome   Rheumatoid factor positive   Chronic pulmonary hypertension (HCC)   Acute respiratory distress secondary to RSV infection and community-acquired pneumonia -Patient's procalcitonin is elevated.  Continue supportive care, bronchodilators, steroids, IV Rocephin and azithromycin.  CTA chest shows multifocal especially left lower lobe pneumonia but no evidence of PE   Essential hypertension -Coreg, Aldactone, daily torsemide, Norvasc.  IV as needed   Pulmonary hypertension. -Follows outpatient cardiology  History of DVT/PE - On Eliquis   Positive rheumatoid factor. -Has positive ANA.  Following outpatient rheumatology   Chronic pain syndrome. Multiple lumbar surgeries. -Continue gabapentin and Norco.  Bowel regimen.   Insomnia. -As needed medications  History of seizure? Vs pain management?  - Unclear why he is on Keppra    DVT prophylaxis: apixaban (ELIQUIS) tablet 5 mg  Code Status:  Family Communication:    Status is: Inpatient Still has abnormal BS. Cont hospital stay  Subjective: Still has exertional dyspnea.    Examination:  General exam: Appears calm and comfortable  Respiratory system: b/l rhonchi Cardiovascular system: S1 & S2 heard, RRR. No JVD, murmurs, rubs, gallops or clicks. No pedal edema. Gastrointestinal system:  Abdomen is nondistended, soft and nontender. No organomegaly or masses felt. Normal bowel sounds heard. Central nervous system: Alert and oriented. No focal neurological deficits. Extremities: Symmetric 5 x 5 power. Skin: No rashes, lesions or ulcers Psychiatry: Judgement and insight appear normal. Mood & affect appropriate.     Objective: Vitals:   10/23/22 1615 10/23/22 2136 10/24/22 0135 10/24/22 0350  BP: 122/75 120/79 120/82 128/80  Pulse: (!) 104 99 95 95  Resp: '20 20 20 16  '$ Temp: 98.4 F (36.9 C) 98.4 F (36.9 C) 98.6 F (37 C) 98 F (36.7 C)  TempSrc: Oral   Oral  SpO2: 96% 96% 94% 94%  Weight:      Height:        Intake/Output Summary (Last 24 hours) at 10/24/2022 0917 Last data filed at 10/24/2022 0850 Gross per 24 hour  Intake 595 ml  Output 550 ml  Net 45 ml   Filed Weights   10/23/22 0909  Weight: 123.8 kg     Data Reviewed:   CBC: Recent Labs  Lab 10/23/22 1000 10/24/22 0610  WBC 16.5* 12.7*  NEUTROABS 14.1*  --   HGB 10.6* 10.1*  HCT 33.4* 32.2*  MCV 97.1 98.5  PLT 290 229   Basic Metabolic Panel: Recent Labs  Lab 10/23/22 1000 10/24/22 0610  NA 140 138  K 4.1 4.1  CL 108 109  CO2 23 20*  GLUCOSE 180* 174*  BUN 23 24*  CREATININE 1.39* 1.24  CALCIUM 10.2 9.3   GFR: Estimated Creatinine Clearance: 75.3 mL/min (by C-G formula based on SCr of 1.24 mg/dL). Liver Function Tests: Recent Labs  Lab 10/23/22 1000  AST 29  ALT 22  ALKPHOS 72  BILITOT 0.4  PROT 8.3*  ALBUMIN 4.1  No results for input(s): "LIPASE", "AMYLASE" in the last 168 hours. No results for input(s): "AMMONIA" in the last 168 hours. Coagulation Profile: No results for input(s): "INR", "PROTIME" in the last 168 hours. Cardiac Enzymes: No results for input(s): "CKTOTAL", "CKMB", "CKMBINDEX", "TROPONINI" in the last 168 hours. BNP (last 3 results) No results for input(s): "PROBNP" in the last 8760 hours. HbA1C: No results for input(s): "HGBA1C" in the last 72  hours. CBG: No results for input(s): "GLUCAP" in the last 168 hours. Lipid Profile: No results for input(s): "CHOL", "HDL", "LDLCALC", "TRIG", "CHOLHDL", "LDLDIRECT" in the last 72 hours. Thyroid Function Tests: No results for input(s): "TSH", "T4TOTAL", "FREET4", "T3FREE", "THYROIDAB" in the last 72 hours. Anemia Panel: No results for input(s): "VITAMINB12", "FOLATE", "FERRITIN", "TIBC", "IRON", "RETICCTPCT" in the last 72 hours. Sepsis Labs: Recent Labs  Lab 10/23/22 1736 10/24/22 0610  PROCALCITON 0.58 0.23    Recent Results (from the past 240 hour(s))  Resp panel by RT-PCR (RSV, Flu A&B, Covid) Nasopharyngeal Swab     Status: Abnormal   Collection Time: 10/23/22 10:00 AM   Specimen: Nasopharyngeal Swab; Nasal Swab  Result Value Ref Range Status   SARS Coronavirus 2 by RT PCR NEGATIVE NEGATIVE Final    Comment: (NOTE) SARS-CoV-2 target nucleic acids are NOT DETECTED.  The SARS-CoV-2 RNA is generally detectable in upper respiratory specimens during the acute phase of infection. The lowest concentration of SARS-CoV-2 viral copies this assay can detect is 138 copies/mL. A negative result does not preclude SARS-Cov-2 infection and should not be used as the sole basis for treatment or other patient management decisions. A negative result may occur with  improper specimen collection/handling, submission of specimen other than nasopharyngeal swab, presence of viral mutation(s) within the areas targeted by this assay, and inadequate number of viral copies(<138 copies/mL). A negative result must be combined with clinical observations, patient history, and epidemiological information. The expected result is Negative.  Fact Sheet for Patients:  EntrepreneurPulse.com.au  Fact Sheet for Healthcare Providers:  IncredibleEmployment.be  This test is no t yet approved or cleared by the [place] FDA and  has been authorized for detection and/or  diagnosis of SARS-CoV-2 by FDA under an Emergency Use Authorization (EUA). This EUA will remain  in effect (meaning this test can be used) for the duration of the COVID-19 declaration under Section 564(b)(1) of the Act, 21 U.S.C.section 360bbb-3(b)(1), unless the authorization is terminated  or revoked sooner.       Influenza A by PCR NEGATIVE NEGATIVE Final   Influenza B by PCR NEGATIVE NEGATIVE Final    Comment: (NOTE) The Xpert Xpress SARS-CoV-2/FLU/RSV plus assay is intended as an aid in the diagnosis of influenza from Nasopharyngeal swab specimens and should not be used as a sole basis for treatment. Nasal washings and aspirates are unacceptable for Xpert Xpress SARS-CoV-2/FLU/RSV testing.  Fact Sheet for Patients: EntrepreneurPulse.com.au  Fact Sheet for Healthcare Providers: IncredibleEmployment.be  This test is not yet approved or cleared by the [place] FDA and has been authorized for detection and/or diagnosis of SARS-CoV-2 by FDA under an Emergency Use Authorization (EUA). This EUA will remain in effect (meaning this test can be used) for the duration of the COVID-19 declaration under Section 564(b)(1) of the Act, 21 U.S.C. section 360bbb-3(b)(1), unless the authorization is terminated or revoked.     Resp Syncytial Virus by PCR POSITIVE (A) NEGATIVE Final    Comment: (NOTE) Fact Sheet for Patients: EntrepreneurPulse.com.au  Fact Sheet for Healthcare Providers: IncredibleEmployment.be  This  test is not yet approved or cleared by the [place] and has been authorized for detection and/or diagnosis of SARS-CoV-2 by FDA under an Emergency Use Authorization (EUA). This EUA will remain in effect (meaning this test can be used) for the duration of the COVID-19 declaration under Section 564(b)(1) of the Act, 21 U.S.C. section 360bbb-3(b)(1), unless the authorization is terminated  or revoked.  Performed at [facility], [address] [place], [place] [address]   Blood culture (routine x 2)     Status: None (Preliminary result)   Collection Time: 10/23/22  1:03 PM   Specimen: Right Antecubital; Blood  Result Value Ref Range Status   Specimen Description   Final    RIGHT ANTECUBITAL BLOOD Performed at [facility] [address], [place], [place] [address]    Special Requests   Final    BOTTLES DRAWN AEROBIC ONLY Blood Culture adequate volume Performed at [facility], [address], [place], [place] [address]    Culture PENDING  Incomplete   Report Status PENDING  Incomplete         Radiology Studies: CT Angio Chest PE W and/or Wo Contrast  Result Date: 10/23/2022 CLINICAL DATA:  Progressively worsening cough, congestion, and shortness of breath over the past 2 weeks. EXAM: CT ANGIOGRAPHY CHEST WITH CONTRAST TECHNIQUE: Multidetector CT imaging of the chest was performed using the standard protocol during bolus administration of intravenous contrast. Multiplanar CT image reconstructions and MIPs were obtained to evaluate the vascular anatomy. RADIATION DOSE REDUCTION: This exam was performed according to the departmental dose-optimization program which includes automated exposure control, adjustment of the mA and/or kV according to patient size and/or use of iterative reconstruction technique. CONTRAST:  142m OMNIPAQUE IOHEXOL 350 MG/ML SOLN COMPARISON:  Chest x-ray from same day. CT chest dated December 20, 2020. FINDINGS: Cardiovascular: Satisfactory opacification of the pulmonary arteries to the segmental level. No evidence of pulmonary embolism. Normal heart size. No pericardial effusion. No thoracic aortic aneurysm or dissection. Coronary, aortic arch, and branch vessel atherosclerotic vascular disease. Mediastinum/Nodes: No enlarged mediastinal, hilar, or axillary lymph nodes. Thyroid gland, trachea, and  esophagus demonstrate no significant findings. Lungs/Pleura: Mild central peribronchial thickening. Patchy consolidation and irregular peribronchovascular ground-glass densities in the left lower lobe. There are a few small irregular peribronchovascular ground-glass densities in the left upper lobe and right lower lobe. No pleural effusion or pneumothorax. Unchanged 5 mm left upper lobe nodule (series 6, image 63). Unchanged 3 mm right middle lobe nodule (series 6, image 69). These are considered benign given stability. No follow-up imaging is recommended. Upper Abdomen: No acute abnormality. Unchanged left adrenal adenoma. No follow-up imaging is recommended. Musculoskeletal: No chest wall abnormality. Interval T11-T12 posterior fusion. Lucency around the right T11 pedicle screw. The left T11 pedicle screw has minimal vertebral body purchase and mostly extends into the T11-T12 disc space. The right T12 pedicle screw extends along the lateral aspect of the vertebral body with minimal bony purchase. Review of the MIP images confirms the above findings. IMPRESSION: 1. No evidence of pulmonary embolism. 2. Findings consistent with multifocal bronchopneumonia, worse in the left lower lobe. 3. Interval T11-T12 posterior fusion with loosening of the right T11 pedicle screw and poor bony purchase of the left T11 and right T12 pedicle screws as described above. 4.  Aortic atherosclerosis (ICD10-I70.0). Electronically Signed   By: WTitus DubinM.D.   On: 10/23/2022 11:41   DG Chest Port 1 View  Result Date: 10/23/2022  CLINICAL DATA:  Shortness of breath. Ongoing cold symptoms for 2 weeks, progressively getting worse. Cough and congestion. EXAM: PORTABLE CHEST 1 VIEW COMPARISON:  Chest radiograph 02/28/2022. FINDINGS: Right lung is clear. Questionable patchy left retrocardiac opacity. Stable cardiomegaly and mediastinal contours with tortuosity of the aorta, likely exaggerated by scoliotic curvature of the mid and  lower thoracic spine. No pleural effusion or pneumothorax. IMPRESSION: Query patchy left retrocardiac opacity. Consider PA and lateral radiographs for further characterization. Electronically Signed   By: Emmit Alexanders M.D   On: 10/23/2022 10:06        Scheduled Meds:  amLODipine  10 mg Oral Daily   apixaban  5 mg Oral BID   azithromycin  500 mg Oral Daily   benzonatate  100 mg Oral TID   carvedilol  12.5 mg Oral BID WC   cyanocobalamin  1,000 mcg Oral Daily   DULoxetine  60 mg Oral Daily   fluticasone  2 spray Each Nare Daily   gabapentin  300 mg Oral TID   guaiFENesin  600 mg Oral BID   ipratropium-albuterol  3 mL Nebulization QID   levETIRAcetam  250 mg Oral BID   methylPREDNISolone (SOLU-MEDROL) injection  40 mg Intravenous Q12H   Followed by   Derrill Memo ON 10/25/2022] predniSONE  50 mg Oral Q breakfast   Riociguat  1.5 mg Oral TID   rOPINIRole  1 mg Oral QHS   rosuvastatin  20 mg Oral Daily   spironolactone  25 mg Oral Daily   torsemide  20 mg Oral Daily   Continuous Infusions:  cefTRIAXone (ROCEPHIN)  IV       LOS: 1 day   Time spent= 35 mins    Chalsea Darko Arsenio Loader, MD [facility]  If 7PM-7AM, please contact night-coverage  10/24/2022, 9:17 AM

## 2022-10-24 NOTE — Progress Notes (Signed)
  Transition of Care [facility]) Screening Note   Patient Details  Name: Keith [name] Date of Birth: 1951-03-19   Transition of Care [facility]) CM/SW Contact:    Vassie Moselle, LCSW Phone Number: 10/24/2022, 12:05 PM    Transition of Care Department [facility]) has reviewed patient and no TOC needs have been identified at this time. We will continue to monitor patient advancement through interdisciplinary progression rounds. If new patient transition needs arise, please place a TOC consult.

## 2022-10-24 NOTE — Plan of Care (Signed)

## 2022-10-25 DIAGNOSIS — J9601 Acute respiratory failure with hypoxia: Secondary | ICD-10-CM | POA: Diagnosis not present

## 2022-10-25 LAB — CBC
HCT: 34.1 % — ABNORMAL LOW (ref 39.0–52.0)
Hemoglobin: 10.6 g/dL — ABNORMAL LOW (ref 13.0–17.0)
MCH: 30.7 pg (ref 26.0–34.0)
MCHC: 31.1 g/dL (ref 30.0–36.0)
MCV: 98.8 fL (ref 80.0–100.0)
Platelets: 321 10*3/uL (ref 150–400)
RBC: 3.45 MIL/uL — ABNORMAL LOW (ref 4.22–5.81)
RDW: 13.9 % (ref 11.5–15.5)
WBC: 10.5 10*3/uL (ref 4.0–10.5)
nRBC: 0.2 % (ref 0.0–0.2)

## 2022-10-25 LAB — PROCALCITONIN: Procalcitonin: 0.19 ng/mL

## 2022-10-25 LAB — BASIC METABOLIC PANEL
Anion gap: 10 (ref 5–15)
BUN: 32 mg/dL — ABNORMAL HIGH (ref 8–23)
CO2: 22 mmol/L (ref 22–32)
Calcium: 9.9 mg/dL (ref 8.9–10.3)
Chloride: 107 mmol/L (ref 98–111)
Creatinine, Ser: 1.1 mg/dL (ref 0.61–1.24)
GFR, Estimated: >60 mL/min (ref >60)
Glucose, Bld: 194 mg/dL — ABNORMAL HIGH (ref 70–99)
Potassium: 4.5 mmol/L (ref 3.5–5.1)
Sodium: 139 mmol/L (ref 135–145)

## 2022-10-25 LAB — MAGNESIUM: Magnesium: 2.4 mg/dL (ref 1.7–2.4)

## 2022-10-25 MED ORDER — AMOXICILLIN-POT CLAVULANATE 875-125 MG PO TABS: 1.0000 | ORAL_TABLET | Freq: Two times a day (BID) | ORAL | 0 refills | Status: AC

## 2022-10-25 MED ORDER — ALBUTEROL SULFATE HFA 108 (90 BASE) MCG/ACT IN AERS: 2.0000 | INHALATION_SPRAY | Freq: Four times a day (QID) | RESPIRATORY_TRACT | 1 refills | Status: DC | PRN

## 2022-10-25 NOTE — Discharge Summary (Signed)
Physician Discharge Summary  NGAI PARCELL GGE:[id number] DOB: Feb 23, 1951 DOA: 10/23/2022  PCP: [name], [name], MD  Admit date: 10/23/2022 Discharge date: 10/25/2022  Admitted From: Home Disposition: Home  Recommendations for Outpatient Follow-up:  Follow up with PCP in 1-2 weeks Please obtain BMP/CBC in one week your next doctors visit.  Albuterol inhaler prescribed 7 more days of oral Augmentin  Home Health: None Equipment/Devices: None Discharge Condition: Stable CODE STATUS: Full code Diet recommendation: Low-salt  Brief/Interim Summary:  72 year old with history of HTN, HLD, pulmonary hypertension, positive rheumatoid factor, PE on anticoagulation, lumbar stenosis status post multiple surgeries, obesity, chronic pain comes to the hospital with shortness of breath.  Patient was diagnosed with RSV infection with possible superimposed community-acquired pneumonia.  Over the course of 48 hours, patient did significantly well in the hospital and he was weaned off oxygen.  Today stable for discharge with outpatient recommendations as stated above.        Acute respiratory distress secondary to RSV infection and community-acquired pneumonia -Patient's procalcitonin is elevated.  CTA chest showed multifocal opacities but no evidence of pulmonary embolism.  Today he has been weaned off oxygen, breath sounds are completely normal and he is feeling significantly well.  Will transition IV antibiotics to oral Augmentin for 7 more days to complete the course.   Essential hypertension -Coreg, Aldactone, daily torsemide, Norvasc. Pulmonary hypertension. -Follows outpatient cardiology   History of DVT/PE - On Eliquis   Positive rheumatoid factor. -Has positive ANA.  Following outpatient rheumatology   Chronic pain syndrome. Multiple lumbar surgeries. -Continue gabapentin and Norco.  Bowel regimen.   Insomnia. -As needed medications   History of seizure? Vs pain management?  -  Unclear why he is on Keppra       Discharge Diagnoses:  Principal Problem:   Acute respiratory failure with hypoxia ([place]) Active Problems:   Essential hypertension   Lumbar spinal stenosis   Chronic pain syndrome   Rheumatoid factor positive   Chronic pulmonary hypertension ([place])      Consultations: None  Subjective: Patient feels great today.  He wishes to go home as his breathing has returned back to baseline.  Discharge Exam: Vitals:   10/25/22 0510 10/25/22 0921  BP: 127/82   Pulse: 94   Resp: 18   Temp: 98.4 F (36.9 C)   SpO2: 96% 99%   Vitals:   10/24/22 2133 10/24/22 2224 10/25/22 0510 10/25/22 0921  BP: 139/76  127/82   Pulse: 97  94   Resp: 18  18   Temp: 98.5 F (36.9 C)  98.4 F (36.9 C)   TempSrc: Oral  Oral   SpO2: 96% 95% 96% 99%  Weight:      Height:        General: Pt is alert, awake, not in acute distress Cardiovascular: RRR, S1/S2 +, no rubs, no gallops Respiratory: CTA bilaterally, no wheezing, no rhonchi Abdominal: Soft, NT, ND, bowel sounds + Extremities: no edema, no cyanosis  Discharge Instructions   Allergies as of 10/25/2022       Reactions   Aspirin Other (See Comments)   Irritates the stomach and the patient developed ulcers, also   Lisinopril Other (See Comments)   Caused a body ache        Medication List     TAKE these medications    Accu-Chek Guide Me w/Device Kit USE TO TEST BLOOD SUGARS 1-2 TIMES DAILY.   acetaminophen 325 MG tablet Commonly known as: TYLENOL Take 1-2 tablets (  325-650 mg total) by mouth every 4 (four) hours as needed for mild pain.   Adempas 1.5 MG Tabs Generic drug: Riociguat Take 1.5 mg by mouth in the morning, at noon, and at bedtime.   albuterol 108 (90 Base) MCG/ACT inhaler Commonly known as: VENTOLIN HFA Inhale 2 puffs into the lungs every 6 (six) hours as needed for wheezing or shortness of breath.   amLODipine 10 MG tablet Commonly known as: NORVASC Take 1 tablet (10  mg total) by mouth daily.   amoxicillin-clavulanate 875-125 MG tablet Commonly known as: AUGMENTIN Take 1 tablet by mouth 2 (two) times daily for 7 days.   Anusol-HC 2.5 % rectal cream Generic drug: hydrocortisone Place 1 application. rectally as needed for hemorrhoids or anal itching.   apixaban 5 MG Tabs tablet Commonly known as: ELIQUIS Take 1 tablet (5 mg total) by mouth 2 (two) times daily.   carvedilol 12.5 MG tablet Commonly known as: COREG Take 1 tablet (12.5 mg total) by mouth 2 (two) times daily with a meal.   cyanocobalamin 1000 MCG tablet Commonly known as: VITAMIN B12 Take 1,000 mcg by mouth daily.   DULoxetine 60 MG capsule Commonly known as: CYMBALTA TAKE 1 CAPSULE BY MOUTH EVERY DAY   ferrous sulfate 325 (65 FE) MG EC tablet Take 325 mg by mouth every other day.   gabapentin 300 MG capsule Commonly known as: NEURONTIN Take 300 mg by mouth 3 (three) times daily.   HYDROcodone-acetaminophen 10-325 MG tablet Commonly known as: NORCO Take 1 tablet by mouth in the morning, at noon, and at bedtime.   levETIRAcetam 250 MG tablet Commonly known as: KEPPRA TAKE 1 TABLET BY MOUTH TWICE A DAY   lidocaine 5 % Commonly known as: LIDODERM Place 1 patch onto the skin daily as needed (pain). Remove & Discard patch within 12 hours or as directed by MD   naloxone 4 MG/0.1ML Liqd nasal spray kit Commonly known as: NARCAN Place 1 spray into the nose as needed (accidental overdose).   polyethylene glycol 17 g packet Commonly known as: MIRALAX / GLYCOLAX Take 17 g by mouth daily as needed for mild constipation.   rOPINIRole 1 MG tablet Commonly known as: REQUIP Take 1 tablet (1 mg total) by mouth at bedtime.   rosuvastatin 20 MG tablet Commonly known as: Crestor Take 1 tablet (20 mg total) by mouth daily.   spironolactone 25 MG tablet Commonly known as: ALDACTONE Take 1 tablet (25 mg total) by mouth daily.   torsemide 20 MG tablet Commonly known as:  DEMADEX Take 1 tablet (20 mg total) by mouth daily.   traZODone 50 MG tablet Commonly known as: DESYREL TAKE 1/2 TO 1 TABLET BY MOUTH AT BEDTIME AS NEEDED FOR SLEEP        Follow-up Information     [name], [name], MD Follow up in 1 week(s).   Specialty: Family Medicine Contact information: [place] [address] [phone]                Allergies  Allergen Reactions   Aspirin Other (See Comments)    Irritates the stomach and the patient developed ulcers, also   Lisinopril Other (See Comments)    Caused a body ache    You were cared for by a hospitalist during your hospital stay. If you have any questions about your discharge medications or the care you received while you were in the hospital after you are discharged, you can call the unit and  asked to speak with the hospitalist on call if the hospitalist that took care of you is not available. Once you are discharged, your primary care physician will handle any further medical issues. Please note that no refills for any discharge medications will be authorized once you are discharged, as it is imperative that you return to your primary care physician (or establish a relationship with a primary care physician if you do not have one) for your aftercare needs so that they can reassess your need for medications and monitor your lab values.   Procedures/Studies: CT Angio Chest PE W and/or Wo Contrast  Result Date: 10/23/2022 CLINICAL DATA:  Progressively worsening cough, congestion, and shortness of breath over the past 2 weeks. EXAM: CT ANGIOGRAPHY CHEST WITH CONTRAST TECHNIQUE: Multidetector CT imaging of the chest was performed using the standard protocol during bolus administration of intravenous contrast. Multiplanar CT image reconstructions and MIPs were obtained to evaluate the vascular anatomy. RADIATION DOSE REDUCTION: This exam was performed according to the departmental dose-optimization  program which includes automated exposure control, adjustment of the mA and/or kV according to patient size and/or use of iterative reconstruction technique. CONTRAST:  143m OMNIPAQUE IOHEXOL 350 MG/ML SOLN COMPARISON:  Chest x-ray from same day. CT chest dated December 20, 2020. FINDINGS: Cardiovascular: Satisfactory opacification of the pulmonary arteries to the segmental level. No evidence of pulmonary embolism. Normal heart size. No pericardial effusion. No thoracic aortic aneurysm or dissection. Coronary, aortic arch, and branch vessel atherosclerotic vascular disease. Mediastinum/Nodes: No enlarged mediastinal, hilar, or axillary lymph nodes. Thyroid gland, trachea, and esophagus demonstrate no significant findings. Lungs/Pleura: Mild central peribronchial thickening. Patchy consolidation and irregular peribronchovascular ground-glass densities in the left lower lobe. There are a few small irregular peribronchovascular ground-glass densities in the left upper lobe and right lower lobe. No pleural effusion or pneumothorax. Unchanged 5 mm left upper lobe nodule (series 6, image 63). Unchanged 3 mm right middle lobe nodule (series 6, image 69). These are considered benign given stability. No follow-up imaging is recommended. Upper Abdomen: No acute abnormality. Unchanged left adrenal adenoma. No follow-up imaging is recommended. Musculoskeletal: No chest wall abnormality. Interval T11-T12 posterior fusion. Lucency around the right T11 pedicle screw. The left T11 pedicle screw has minimal vertebral body purchase and mostly extends into the T11-T12 disc space. The right T12 pedicle screw extends along the lateral aspect of the vertebral body with minimal bony purchase. Review of the MIP images confirms the above findings. IMPRESSION: 1. No evidence of pulmonary embolism. 2. Findings consistent with multifocal bronchopneumonia, worse in the left lower lobe. 3. Interval T11-T12 posterior fusion with loosening of the  right T11 pedicle screw and poor bony purchase of the left T11 and right T12 pedicle screws as described above. 4.  Aortic atherosclerosis (ICD10-I70.0). Electronically Signed   By: WTitus DubinM.D.   On: 10/23/2022 11:41   DG Chest Port 1 View  Result Date: 10/23/2022 CLINICAL DATA:  Shortness of breath. Ongoing cold symptoms for 2 weeks, progressively getting worse. Cough and congestion. EXAM: PORTABLE CHEST 1 VIEW COMPARISON:  Chest radiograph 02/28/2022. FINDINGS: Right lung is clear. Questionable patchy left retrocardiac opacity. Stable cardiomegaly and mediastinal contours with tortuosity of the aorta, likely exaggerated by scoliotic curvature of the mid and lower thoracic spine. No pleural effusion or pneumothorax. IMPRESSION: Query patchy left retrocardiac opacity. Consider PA and lateral radiographs for further characterization. Electronically Signed   By: WEmmit AlexandersM.D   On: 10/23/2022 10:06     The  results of significant diagnostics from this hospitalization (including imaging, microbiology, ancillary and laboratory) are listed below for reference.     Microbiology: Recent Results (from the past 240 hour(s))  Resp panel by RT-PCR (RSV, Flu A&B, Covid) Nasopharyngeal Swab     Status: Abnormal   Collection Time: 10/23/22 10:00 AM   Specimen: Nasopharyngeal Swab; Nasal Swab  Result Value Ref Range Status   SARS Coronavirus 2 by RT PCR NEGATIVE NEGATIVE Final    Comment: (NOTE) SARS-CoV-2 target nucleic acids are NOT DETECTED.  The SARS-CoV-2 RNA is generally detectable in upper respiratory specimens during the acute phase of infection. The lowest concentration of SARS-CoV-2 viral copies this assay can detect is 138 copies/mL. A negative result does not preclude SARS-Cov-2 infection and should not be used as the sole basis for treatment or other patient management decisions. A negative result may occur with  improper specimen collection/handling, submission of specimen  other than nasopharyngeal swab, presence of viral mutation(s) within the areas targeted by this assay, and inadequate number of viral copies(<138 copies/mL). A negative result must be combined with clinical observations, patient history, and epidemiological information. The expected result is Negative.  Fact Sheet for Patients:  EntrepreneurPulse.com.au  Fact Sheet for Healthcare Providers:  IncredibleEmployment.be  This test is no t yet approved or cleared by the [place] FDA and  has been authorized for detection and/or diagnosis of SARS-CoV-2 by FDA under an Emergency Use Authorization (EUA). This EUA will remain  in effect (meaning this test can be used) for the duration of the COVID-19 declaration under Section 564(b)(1) of the Act, 21 U.S.C.section 360bbb-3(b)(1), unless the authorization is terminated  or revoked sooner.       Influenza A by PCR NEGATIVE NEGATIVE Final   Influenza B by PCR NEGATIVE NEGATIVE Final    Comment: (NOTE) The Xpert Xpress SARS-CoV-2/FLU/RSV plus assay is intended as an aid in the diagnosis of influenza from Nasopharyngeal swab specimens and should not be used as a sole basis for treatment. Nasal washings and aspirates are unacceptable for Xpert Xpress SARS-CoV-2/FLU/RSV testing.  Fact Sheet for Patients: EntrepreneurPulse.com.au  Fact Sheet for Healthcare Providers: IncredibleEmployment.be  This test is not yet approved or cleared by the [place] FDA and has been authorized for detection and/or diagnosis of SARS-CoV-2 by FDA under an Emergency Use Authorization (EUA). This EUA will remain in effect (meaning this test can be used) for the duration of the COVID-19 declaration under Section 564(b)(1) of the Act, 21 U.S.C. section 360bbb-3(b)(1), unless the authorization is terminated or revoked.     Resp Syncytial Virus by PCR POSITIVE (A) NEGATIVE Final     Comment: (NOTE) Fact Sheet for Patients: EntrepreneurPulse.com.au  Fact Sheet for Healthcare Providers: IncredibleEmployment.be  This test is not yet approved or cleared by the [place] FDA and has been authorized for detection and/or diagnosis of SARS-CoV-2 by FDA under an Emergency Use Authorization (EUA). This EUA will remain in effect (meaning this test can be used) for the duration of the COVID-19 declaration under Section 564(b)(1) of the Act, 21 U.S.C. section 360bbb-3(b)(1), unless the authorization is terminated or revoked.  Performed at [facility], [address] [place], [place] [address]   Blood culture (routine x 2)     Status: None (Preliminary result)   Collection Time: 10/23/22  1:03 PM   Specimen: BLOOD RIGHT HAND  Result Value Ref Range Status   Specimen Description   Final    BLOOD RIGHT HAND Performed at [facility], [address] [place], [place] [address]    Special Requests   Final    BOTTLES DRAWN AEROBIC AND ANAEROBIC Blood Culture adequate volume Performed at [facility], [address] [place], [place] [address]    Culture   Final    NO GROWTH 2 DAYS Performed at [facility], [place] [address] [place], [place] [address]    Report Status PENDING  Incomplete  Blood culture (routine x 2)     Status: None (Preliminary result)   Collection Time: 10/23/22  1:03 PM   Specimen: Right Antecubital; Blood  Result Value Ref Range Status   Specimen Description   Final    RIGHT ANTECUBITAL BLOOD Performed at [facility], [place] [address], [place], [place] [address]    Special Requests   Final    BOTTLES DRAWN AEROBIC ONLY Blood Culture adequate volume Performed at [facility], [address] [place], [place] [address]    Culture   Final    NO GROWTH 2 DAYS Performed at [facility], [place] [address]  [place], [place] [address]    Report Status PENDING  Incomplete     Labs: BNP (last 3 results) Recent Labs    07/26/22 1048 09/13/22 0949 10/23/22 1000  BNP 16.0 13.9 37.0   Basic Metabolic Panel: Recent Labs  Lab 10/23/22 1000 10/24/22 0610 10/25/22 0634  NA 140 138 139  K 4.1 4.1 4.5  CL 108 109 107  CO2 23 20* 22  GLUCOSE 180* 174* 194*  BUN 23 24* 32*  CREATININE 1.39* 1.24 1.10  CALCIUM 10.2 9.3 9.9  MG  --   --  2.4   Liver Function Tests: Recent Labs  Lab 10/23/22 1000  AST 29  ALT 22  ALKPHOS 72  BILITOT 0.4  PROT 8.3*  ALBUMIN 4.1   No results for input(s): "LIPASE", "AMYLASE" in the last 168 hours. No results for input(s): "AMMONIA" in the last 168 hours. CBC: Recent Labs  Lab 10/23/22 1000 10/24/22 0610 10/25/22 0634  WBC 16.5* 12.7* 10.5  NEUTROABS 14.1*  --   --   HGB 10.6* 10.1* 10.6*  HCT 33.4* 32.2* 34.1*  MCV 97.1 98.5 98.8  PLT 290 303 321   Cardiac Enzymes: No results for input(s): "CKTOTAL", "CKMB", "CKMBINDEX", "TROPONINI" in the last 168 hours. BNP: Invalid input(s): "POCBNP" CBG: No results for input(s): "GLUCAP" in the last 168 hours. D-Dimer No results for input(s): "DDIMER" in the last 72 hours. Hgb A1c No results for input(s): "HGBA1C" in the last 72 hours. Lipid Profile No results for input(s): "CHOL", "HDL", "LDLCALC", "TRIG", "CHOLHDL", "LDLDIRECT" in the last 72 hours. Thyroid function studies No results for input(s): "TSH", "T4TOTAL", "T3FREE", "THYROIDAB" in the last 72 hours.  Invalid input(s): "FREET3" Anemia work up No results for input(s): "VITAMINB12", "FOLATE", "FERRITIN", "TIBC", "IRON", "RETICCTPCT" in the last 72 hours. Urinalysis    Component Value Date/Time   COLORURINE YELLOW 03/27/2022 2016   APPEARANCEUR CLOUDY (A) 03/27/2022 2016   LABSPEC 1.020 03/27/2022 2016   PHURINE 8.0 03/27/2022 2016   GLUCOSEU NEGATIVE 03/27/2022 2016   HGBUR NEGATIVE 03/27/2022 2016   HGBUR negative 10/26/2010 0824    BILIRUBINUR NEGATIVE 03/27/2022 2016   BILIRUBINUR n 01/12/2019 [place] 03/27/2022 2016   PROTEINUR NEGATIVE 03/27/2022 2016   UROBILINOGEN 0.2 01/12/2019 1020   UROBILINOGEN 0.2 04/10/2013 1555   NITRITE POSITIVE (A) 03/27/2022 2016   LEUKOCYTESUR NEGATIVE 03/27/2022 2016  Sepsis Labs Recent Labs  Lab 10/23/22 1000 10/24/22 0610 10/25/22 0634  WBC 16.5* 12.7* 10.5   Microbiology Recent Results (from the past 240 hour(s))  Resp panel by RT-PCR (RSV, Flu A&B, Covid) Nasopharyngeal Swab     Status: Abnormal   Collection Time: 10/23/22 10:00 AM   Specimen: Nasopharyngeal Swab; Nasal Swab  Result Value Ref Range Status   SARS Coronavirus 2 by RT PCR NEGATIVE NEGATIVE Final    Comment: (NOTE) SARS-CoV-2 target nucleic acids are NOT DETECTED.  The SARS-CoV-2 RNA is generally detectable in upper respiratory specimens during the acute phase of infection. The lowest concentration of SARS-CoV-2 viral copies this assay can detect is 138 copies/mL. A negative result does not preclude SARS-Cov-2 infection and should not be used as the sole basis for treatment or other patient management decisions. A negative result may occur with  improper specimen collection/handling, submission of specimen other than nasopharyngeal swab, presence of viral mutation(s) within the areas targeted by this assay, and inadequate number of viral copies(<138 copies/mL). A negative result must be combined with clinical observations, patient history, and epidemiological information. The expected result is Negative.  Fact Sheet for Patients:  EntrepreneurPulse.com.au  Fact Sheet for Healthcare Providers:  IncredibleEmployment.be  This test is no t yet approved or cleared by the [place] FDA and  has been authorized for detection and/or diagnosis of SARS-CoV-2 by FDA under an Emergency Use Authorization (EUA). This EUA will remain  in effect (meaning  this test can be used) for the duration of the COVID-19 declaration under Section 564(b)(1) of the Act, 21 U.S.C.section 360bbb-3(b)(1), unless the authorization is terminated  or revoked sooner.       Influenza A by PCR NEGATIVE NEGATIVE Final   Influenza B by PCR NEGATIVE NEGATIVE Final    Comment: (NOTE) The Xpert Xpress SARS-CoV-2/FLU/RSV plus assay is intended as an aid in the diagnosis of influenza from Nasopharyngeal swab specimens and should not be used as a sole basis for treatment. Nasal washings and aspirates are unacceptable for Xpert Xpress SARS-CoV-2/FLU/RSV testing.  Fact Sheet for Patients: EntrepreneurPulse.com.au  Fact Sheet for Healthcare Providers: IncredibleEmployment.be  This test is not yet approved or cleared by the [place] FDA and has been authorized for detection and/or diagnosis of SARS-CoV-2 by FDA under an Emergency Use Authorization (EUA). This EUA will remain in effect (meaning this test can be used) for the duration of the COVID-19 declaration under Section 564(b)(1) of the Act, 21 U.S.C. section 360bbb-3(b)(1), unless the authorization is terminated or revoked.     Resp Syncytial Virus by PCR POSITIVE (A) NEGATIVE Final    Comment: (NOTE) Fact Sheet for Patients: EntrepreneurPulse.com.au  Fact Sheet for Healthcare Providers: IncredibleEmployment.be  This test is not yet approved or cleared by the [place] FDA and has been authorized for detection and/or diagnosis of SARS-CoV-2 by FDA under an Emergency Use Authorization (EUA). This EUA will remain in effect (meaning this test can be used) for the duration of the COVID-19 declaration under Section 564(b)(1) of the Act, 21 U.S.C. section 360bbb-3(b)(1), unless the authorization is terminated or revoked.  Performed at [facility], [address], [place], [place] [address]   Blood  culture (routine x 2)     Status: None (Preliminary result)   Collection Time: 10/23/22  1:03 PM   Specimen: BLOOD RIGHT HAND  Result Value Ref Range Status   Specimen Description   Final    BLOOD RIGHT HAND Performed at [facility],  [address] [place], [place] [address]    Special Requests   Final    BOTTLES DRAWN AEROBIC AND ANAEROBIC Blood Culture adequate volume Performed at [facility], [address] [place], [place] [address]    Culture   Final    NO GROWTH 2 DAYS Performed at [facility], [place] [address], [place], [place] [address]    Report Status PENDING  Incomplete  Blood culture (routine x 2)     Status: None (Preliminary result)   Collection Time: 10/23/22  1:03 PM   Specimen: Right Antecubital; Blood  Result Value Ref Range Status   Specimen Description   Final    RIGHT ANTECUBITAL BLOOD Performed at [facility], [place] [address] [place], [place] [address]    Special Requests   Final    BOTTLES DRAWN AEROBIC ONLY Blood Culture adequate volume Performed at [facility], [address] [place], [place] [address]    Culture   Final    NO GROWTH 2 DAYS Performed at [facility], [place] [address], [place], [place] [address]    Report Status PENDING  Incomplete     Time coordinating discharge:  I have spent 35 minutes face to face with the patient and on the ward discussing the patients care, assessment, plan and disposition with other care givers. >50% of the time was devoted counseling the patient about the risks and benefits of treatment/Discharge disposition and coordinating care.   SIGNED:   Damita Lack, MD  [facility] 10/25/2022, 11:54 AM   If 7PM-7AM, please contact night-coverage

## 2022-10-25 NOTE — Plan of Care (Signed)

## 2022-10-25 NOTE — Telephone Encounter (Signed)
Patient says he has been sick with RSV and has been in the hospital. The day he got admitted in the hospital is the day the mask came and he just got out of the hospital today.

## 2022-10-26 ENCOUNTER — Telehealth: Payer: Self-pay

## 2022-10-26 ENCOUNTER — Telehealth: Payer: Self-pay | Admitting: *Deleted

## 2022-10-26 NOTE — Patient Outreach (Signed)
  Care Coordination [facility] Note Transition Care Management Unsuccessful Follow-up Telephone Call  Date of discharge and from where:  10/25/22-[facility]  Attempts:  1st Attempt  Reason for unsuccessful TCM follow-up call:  Left voice message   Hetty Blend [place] Management Telephonic Care Management Coordinator Direct Phone: [phone] Toll Free: [phone] Fax: [phone]

## 2022-10-26 NOTE — Progress Notes (Signed)
  Care Coordination  Note  10/26/2022 Name: Keith [name] MRN: [id number] DOB: 09-09-1951  Keith [name] is a 72 y.o. year old primary care patient of [name], Malka So, MD. I reached out to [name] by phone today to assist with scheduling a follow up appointment. [name] verbally consented to my assistance.       Follow up plan: Unsuccessful telephone outreach attempt made. A HIPAA compliant phone message was left for the patient providing contact information and requesting a return call.   Julian Hy, [place] Direct Dial: [phone]

## 2022-10-26 NOTE — Patient Outreach (Signed)
  Care Coordination TOC Note Transition Care Management Follow-up Telephone Call Date of discharge and from where: 10/25/22-[place]   Dx: "RSV" How have you been since you were released from the hospital? Incoming call from patient retuning RN CM call. Patient states he is "doing better but just weak." He rested fairly well last night with sleeping with pillows behind him to elevate head. He reports intermittent coughing spells at times managed with Albuterol. He is taking abx therapy as ordered.He denies any SOB. Appetite has been good and no issues with elimination. Any questions or concerns? No  Items Reviewed: Did the pt receive and understand the discharge instructions provided? Yes  Medications obtained and verified? Yes  Other? No  Any new allergies since your discharge? No  Dietary orders reviewed? Yes-low salt Do you have support at home? Yes -spouse and nephew  [place] and Equipment/Supplies: Were home health services ordered? not applicable If so, what is the name of the agency? N/A  Has the agency set up a time to come to the patient's home? not applicable Were any new equipment or medical supplies ordered?  No What is the name of the medical supply agency? N/A Were you able to get the supplies/equipment? not applicable Do you have any questions related to the use of the equipment or supplies? No  Functional Questionnaire: (I = Independent and D = Dependent) ADLs: A  Bathing/Dressing- I  Meal Prep- A  Eating- I  Maintaining continence- I  Transferring/Ambulation- I  Managing Meds- I  Follow up appointments reviewed:  PCP Hospital f/u appt confirmed? No  Patient agreeable to care guide calling to assist with scheduling. [facility] f/u appt confirmed?  N/A Are transportation arrangements needed? No  If their condition worsens, is the pt aware to call PCP or go to the Emergency Dept.? Yes Was the patient provided with contact information for  the PCP's office or ED? Yes Was to pt encouraged to call back with questions or concerns? Yes  SDOH assessments and interventions completed:   Yes SDOH Interventions Today    Flowsheet Row Most Recent Value  SDOH Interventions   Food Insecurity Interventions Intervention Not Indicated  Transportation Interventions Intervention Not Indicated       Care Coordination Interventions:  PCP follow up appointment requested Education provided on resp sx mgmt, s/s of worsening condition    Encounter Outcome:  Pt. Visit Completed    Enzo Montgomery, RN,BSN,CCM [facility] Telephonic Care Management Coordinator Direct Phone: [phone] Toll Free: [phone] Fax: [phone]

## 2022-10-26 NOTE — Progress Notes (Signed)
  Care Coordination  Note  10/26/2022 Name: Keith [name] MRN: [id number] DOB: 08-28-1951  Keith [name] is a 72 y.o. year old primary care patient of [name], Malka So, MD. I reached out to [name] by phone today to assist with scheduling a follow up appointment. [name] verbally consented to my assistance.       Follow up plan: Hospital Follow Up appointment scheduled with (Dr [name]) on (11/02/2022) at (2pm).  Julian Hy, [place] Direct Dial: [phone]

## 2022-10-28 LAB — CULTURE, BLOOD (ROUTINE X 2)
Culture: NO GROWTH
Culture: NO GROWTH
Special Requests: ADEQUATE
Special Requests: ADEQUATE

## 2022-10-29 DIAGNOSIS — G8929 Other chronic pain: Secondary | ICD-10-CM | POA: Diagnosis not present

## 2022-10-29 DIAGNOSIS — M25551 Pain in right hip: Secondary | ICD-10-CM | POA: Diagnosis not present

## 2022-10-29 DIAGNOSIS — M25571 Pain in right ankle and joints of right foot: Secondary | ICD-10-CM | POA: Diagnosis not present

## 2022-10-29 DIAGNOSIS — I10 Essential (primary) hypertension: Secondary | ICD-10-CM | POA: Diagnosis not present

## 2022-10-29 DIAGNOSIS — M549 Dorsalgia, unspecified: Secondary | ICD-10-CM | POA: Diagnosis not present

## 2022-10-29 DIAGNOSIS — Z9181 History of falling: Secondary | ICD-10-CM | POA: Diagnosis not present

## 2022-10-29 DIAGNOSIS — Z79899 Other long term (current) drug therapy: Secondary | ICD-10-CM | POA: Diagnosis not present

## 2022-10-29 DIAGNOSIS — Z5320 Procedure and treatment not carried out because of patient's decision for unspecified reasons: Secondary | ICD-10-CM | POA: Diagnosis not present

## 2022-10-29 DIAGNOSIS — Z0000 Encounter for general adult medical examination without abnormal findings: Secondary | ICD-10-CM | POA: Diagnosis not present

## 2022-10-31 ENCOUNTER — Telehealth: Payer: Medicare Other

## 2022-10-31 NOTE — Progress Notes (Unsigned)
Chief Complaint  Patient presents with   Hospitalization Follow-up   HPI: Keith [name] is a 72 y.o. male, who is here today to follow on recent hospital visit. Hospitalized from 1/16-1/18/24. TCM call on 10/26/22. He presented to the ED complaining of worsening of shortness of breath. He was diagnosed with RSV infection with possible superimposed community-acquired pneumonia. Procalcitonin is elevated.  CTA chest showed multifocal opacities but no evidence of pulmonary embolism.  He required supplemental O2 during hospitalization, he was weaned off before discharge. CXR 10/23/22: Query patchy left retrocardiac opacity  Chest CTA 10/23/22: 1. No evidence of pulmonary embolism. 2. Findings consistent with multifocal bronchopneumonia, worse inthe left lower lobe. Discharged on Augmentin 875-125 mg  Lab Results  Component Value Date   WBC 10.5 10/25/2022   HGB 10.6 (L) 10/25/2022   HCT 34.1 (L) 10/25/2022   MCV 98.8 10/25/2022   PLT 321 10/25/2022   Lab Results  Component Value Date   CREATININE 1.10 10/25/2022   BUN 32 (H) 10/25/2022   NA 139 10/25/2022   K 4.5 10/25/2022   CL 107 10/25/2022   CO2 22 10/25/2022   Review of Systems See other pertinent positives and negatives in HPI.  Current Outpatient Medications on File Prior to Visit  Medication Sig Dispense Refill   acetaminophen (TYLENOL) 325 MG tablet Take 1-2 tablets (325-650 mg total) by mouth every 4 (four) hours as needed for mild pain.     albuterol (VENTOLIN HFA) 108 (90 Base) MCG/ACT inhaler Inhale 2 puffs into the lungs every 6 (six) hours as needed for wheezing or shortness of breath. 8 g 1   amLODipine (NORVASC) 10 MG tablet Take 1 tablet (10 mg total) by mouth daily. 90 tablet 1   apixaban (ELIQUIS) 5 MG TABS tablet Take 1 tablet (5 mg total) by mouth 2 (two) times daily. 60 tablet 11   Blood Glucose Monitoring Suppl (ACCU-CHEK GUIDE ME) w/Device KIT USE TO TEST BLOOD SUGARS 1-2 TIMES DAILY. 1 kit 0    carvedilol (COREG) 12.5 MG tablet Take 1 tablet (12.5 mg total) by mouth 2 (two) times daily with a meal. 180 tablet 3   cyanocobalamin (VITAMIN B12) 1000 MCG tablet Take 1,000 mcg by mouth daily.     DULoxetine (CYMBALTA) 60 MG capsule TAKE 1 CAPSULE BY MOUTH EVERY DAY 90 capsule 2   ferrous sulfate 325 (65 FE) MG EC tablet Take 325 mg by mouth every other day.     gabapentin (NEURONTIN) 300 MG capsule Take 300 mg by mouth 3 (three) times daily.     HYDROcodone-acetaminophen (NORCO) 10-325 MG tablet Take 1 tablet by mouth in the morning, at noon, and at bedtime.     hydrocortisone (ANUSOL-HC) 2.5 % rectal cream Place 1 application. rectally as needed for hemorrhoids or anal itching.     levETIRAcetam (KEPPRA) 250 MG tablet TAKE 1 TABLET BY MOUTH TWICE A DAY 180 tablet 1   lidocaine (LIDODERM) 5 % Place 1 patch onto the skin daily as needed (pain). Remove & Discard patch within 12 hours or as directed by MD     naloxone (NARCAN) nasal spray 4 mg/0.1 mL Place 1 spray into the nose as needed (accidental overdose).     polyethylene glycol (MIRALAX / GLYCOLAX) 17 g packet Take 17 g by mouth daily as needed for mild constipation.     Riociguat (ADEMPAS) 1.5 MG TABS Take 1.5 mg by mouth in the morning, at noon, and at bedtime.     rOPINIRole (  REQUIP) 1 MG tablet Take 1 tablet (1 mg total) by mouth at bedtime. 90 tablet 2   rosuvastatin (CRESTOR) 20 MG tablet Take 1 tablet (20 mg total) by mouth daily. 90 tablet 3   spironolactone (ALDACTONE) 25 MG tablet Take 1 tablet (25 mg total) by mouth daily. 90 tablet 3   torsemide (DEMADEX) 20 MG tablet Take 1 tablet (20 mg total) by mouth daily. 90 tablet 3   traZODone (DESYREL) 50 MG tablet TAKE 1/2 TO 1 TABLET BY MOUTH AT BEDTIME AS NEEDED FOR SLEEP 90 tablet 1   No current facility-administered medications on file prior to visit.   Past Medical History:  Diagnosis Date   Allergy    Chronic back pain    Diabetes mellitus without complication ([place])    DNR  (do not resuscitate) 11/19/2020   Duodenal ulcer hemorrhage 08/29/2014   ED (erectile dysfunction)    Esophageal stricture 08/30/2014   Glucose intolerance (impaired glucose tolerance)    Hiatal hernia 08/30/2014   Hypertension    Hypokalemia 04/11/2013   MRSA carrier 08/30/2014   Obesity    Osteoarthritis    Pneumonia    Pulmonary embolism ([place]) 06/2020   Scoliosis 2016   Spinal stenosis of lumbar region    Urinary tract infection 04/11/2013   Allergies  Allergen Reactions   Aspirin Other (See Comments)    Irritates the stomach and the patient developed ulcers, also   Lisinopril Other (See Comments)    Caused a body ache    Social History   Socioeconomic History   Marital status: Married    Spouse name: Not on file   Number of children: Not on file   Years of education: Not on file   Highest education level: Not on file  Occupational History   Occupation: retired  Tobacco Use   Smoking status: Former    Packs/day: 1.00    Years: 25.00    Total pack years: 25.00    Types: Cigarettes    Quit date: 1995    Years since quitting: 29.0   Smokeless tobacco: Never  Vaping Use   Vaping Use: Never used  Substance and Sexual Activity   Alcohol use: Not Currently    Comment: holidays and special occasions   Drug use: Yes    Types: Oxycodone   Sexual activity: Yes    Birth control/protection: None  Other Topics Concern   Not on file  Social History Narrative   Married.     Social Determinants of Health   Financial Resource Strain: Low Risk  (03/06/2022)   Overall Financial Resource Strain (CARDIA)    Difficulty of Paying Living Expenses: Not hard at all  Food Insecurity: No Food Insecurity (10/26/2022)   Hunger Vital Sign    Worried About Running Out of Food in the Last Year: Never true    Ran Out of Food in the Last Year: Never true  Transportation Needs: No Transportation Needs (10/26/2022)   PRAPARE - Hydrologist (Medical): No     Lack of Transportation (Non-Medical): No  Physical Activity: Sufficiently Active (01/09/2022)   Exercise Vital Sign    Days of Exercise per Week: 7 days    Minutes of Exercise per Session: 30 min  Stress: No Stress Concern Present (01/09/2022)   [place]    Feeling of Stress : Not at all  Social Connections: Moderately Integrated (01/03/2021)   Social Connection and Isolation  Panel [NHANES]    Frequency of Communication with Friends and Family: Three times a week    Frequency of Social Gatherings with Friends and Family: Three times a week    Attends Religious Services: More than 4 times per year    Active Member of Clubs or Organizations: No    Attends Archivist Meetings: Never    Marital Status: Married   Vitals:   11/02/22 1349  BP: 118/64  Pulse: 94  Resp: 16  SpO2: 97%  Body mass index is 37.03 kg/m.  Physical Exam Nursing note reviewed.  Constitutional:      General: He is not in acute distress.    Appearance: He is well-developed.  HENT:     Head: Normocephalic and atraumatic.     Mouth/Throat:     Mouth: Mucous membranes are moist.  Eyes:     Conjunctiva/sclera: Conjunctivae normal.  Cardiovascular:     Rate and Rhythm: Normal rate and regular rhythm.     Heart sounds: No murmur heard.    Comments: PT pulses palpable. Pulmonary:     Effort: Pulmonary effort is normal. No respiratory distress.     Breath sounds: Normal breath sounds.  Abdominal:     Palpations: Abdomen is soft.     Tenderness: There is no abdominal tenderness.  Musculoskeletal:     Right lower leg: 1+ Pitting Edema present.     Left lower leg: 1+ Pitting Edema present.  Skin:    General: Skin is warm.     Findings: No erythema or rash.  Neurological:     Mental Status: He is alert and oriented to person, place, and time.     Cranial Nerves: No cranial nerve deficit.     Comments: He is in a wheel chair   Psychiatric:        Mood and Affect: Mood and affect normal.   ASSESSMENT AND PLAN:  Mr.Dennison was seen today for hospitalization follow-up.  Diagnoses and all orders for this visit:  Essential hypertension -     Basic metabolic panel; Future  Chronic respiratory failure with hypoxia ([place]) -     Ambulatory referral to [facility] acquired pneumonia of left lower lobe of lung -     CBC; Future -     Ambulatory referral to Home Health  Type 2 diabetes mellitus with diabetic neuropathy, without long-term current use of insulin (HCC) -     Hemoglobin A1c; Future -     Ambulatory referral to Home Health  Stage 3a chronic kidney disease ([place]) -     Basic metabolic panel; Future    Orders Placed This Encounter  Procedures   Basic metabolic panel   CBC   Hemoglobin A1c   Ambulatory referral to Home Health    No problem-specific Assessment & Plan notes found for this encounter.   Return in about 6 weeks (around 12/14/2022).  Khadeejah Castner G. [name], MD  [facility] [place] office.

## 2022-10-31 NOTE — Progress Notes (Signed)
Per Coralie Common, Patient says he has been sick with RSV and has been in the hospital.  The day he got admitted in the hospital is the day the mask came and he just got out of the  hospital 6 days ago. Per Coralie Common, he has until March 1 to initate use of his device. Letter written to  Patient stating he was recently hospitalized so that he does not lose device for non-compliance.

## 2022-11-02 ENCOUNTER — Other Ambulatory Visit (HOSPITAL_COMMUNITY): Payer: Self-pay

## 2022-11-02 ENCOUNTER — Ambulatory Visit (INDEPENDENT_AMBULATORY_CARE_PROVIDER_SITE_OTHER): Payer: Medicare Other | Admitting: Family Medicine

## 2022-11-02 ENCOUNTER — Encounter: Payer: Self-pay | Admitting: Family Medicine

## 2022-11-02 VITALS — BP 118/64 | HR 94 | Resp 16 | Ht 72.0 in

## 2022-11-02 DIAGNOSIS — E1142 Type 2 diabetes mellitus with diabetic polyneuropathy: Secondary | ICD-10-CM | POA: Diagnosis not present

## 2022-11-02 DIAGNOSIS — N1831 Chronic kidney disease, stage 3a: Secondary | ICD-10-CM

## 2022-11-02 DIAGNOSIS — I10 Essential (primary) hypertension: Secondary | ICD-10-CM | POA: Diagnosis not present

## 2022-11-02 DIAGNOSIS — J189 Pneumonia, unspecified organism: Secondary | ICD-10-CM

## 2022-11-02 DIAGNOSIS — E1140 Type 2 diabetes mellitus with diabetic neuropathy, unspecified: Secondary | ICD-10-CM | POA: Diagnosis not present

## 2022-11-02 DIAGNOSIS — J9611 Chronic respiratory failure with hypoxia: Secondary | ICD-10-CM

## 2022-11-02 LAB — HEMOGLOBIN A1C: Hgb A1c MFr Bld: 6.1 % (ref 4.6–6.5)

## 2022-11-02 LAB — CBC
HCT: 33.7 % — ABNORMAL LOW (ref 39.0–52.0)
Hemoglobin: 11 g/dL — ABNORMAL LOW (ref 13.0–17.0)
MCHC: 32.7 g/dL (ref 30.0–36.0)
MCV: 95.2 fl (ref 78.0–100.0)
Platelets: 412 10*3/uL — ABNORMAL HIGH (ref 150.0–400.0)
RBC: 3.55 Mil/uL — ABNORMAL LOW (ref 4.22–5.81)
RDW: 14.1 % (ref 11.5–15.5)
WBC: 7.6 10*3/uL (ref 4.0–10.5)

## 2022-11-02 LAB — BASIC METABOLIC PANEL
BUN: 15 mg/dL (ref 6–23)
CO2: 27 mEq/L (ref 19–32)
Calcium: 10.4 mg/dL (ref 8.4–10.5)
Chloride: 103 mEq/L (ref 96–112)
Creatinine, Ser: 1.03 mg/dL (ref 0.40–1.50)
GFR: 73.71 mL/min (ref >60.00)
Glucose, Bld: 139 mg/dL — ABNORMAL HIGH (ref 70–99)
Potassium: 3.8 mEq/L (ref 3.5–5.1)
Sodium: 139 mEq/L (ref 135–145)

## 2022-11-02 MED ORDER — HYDROCODONE-ACETAMINOPHEN 7.5-325 MG PO TABS
ORAL_TABLET | ORAL | 0 refills | Status: DC
  Filled 2022-11-02: qty 120, 30d supply, fill #0

## 2022-11-02 NOTE — Patient Instructions (Addendum)
A few things to remember from today's visit:  Essential hypertension - Plan: Basic metabolic panel  Chronic respiratory failure with hypoxia ([place]) - Plan: Ambulatory referral to [place] acquired pneumonia of left lower lobe of lung - Plan: CBC, Ambulatory referral to [facility]  Type 2 diabetes mellitus with diabetic neuropathy, without long-term current use of insulin ([place]), Chronic - Plan: Hemoglobin A1c, Ambulatory referral to [place]  Stage 3a chronic kidney disease ([place]), Chronic - Plan: Basic metabolic panel  No changes today. Home PT will be arranged.  If you need refills for medications you take chronically, please call your pharmacy. Do not use My Chart to request refills or for acute issues that need immediate attention. If you send a my chart message, it may take a few days to be addressed, specially if I am not in the office.  Please be sure medication list is accurate. If a new problem present, please set up appointment sooner than planned today.

## 2022-11-02 NOTE — Assessment & Plan Note (Signed)
Clinically improving. Completed abx treatment, last day today. I do not think further imaging is needed. Instructed about warning signs.

## 2022-11-02 NOTE — Assessment & Plan Note (Signed)
HgA1C has been at goal. Continue nonpharmacologic treatment. Annual eye exam and foot care to continue. F/U in 6 months.

## 2022-11-02 NOTE — Assessment & Plan Note (Signed)
BP adequately controlled. Continue carvedilol 12.5 mg twice daily,Spironolactone 25 mg daily,Amlodipine 10 mg daily, and clonidine 0.1 mg daily. Continue low-salt diet. Continue monitoring BP at home.

## 2022-11-02 NOTE — Assessment & Plan Note (Signed)
O2 sats have improved,continue O2 supplementation when in bed with his CPAP.

## 2022-11-03 NOTE — Assessment & Plan Note (Signed)
Cr and e GFR have improved overtime, Cr 1.2-1.3 and e GFR 55-60's. Adequate glucose and BP control. Continue adequate hydration, low-salt diet, and avoidance of NSAIDs.

## 2022-11-03 NOTE — Assessment & Plan Note (Addendum)
He is still symptomatic. Continue Gabapentin and Duloxetine same dose. Topical capsaicin, Qutenza, could be an option, he needs to discuss this with his pain management provider. He also has radicular pain that can be contributing to LE pain. Continue appropriate foot care.

## 2022-11-06 ENCOUNTER — Telehealth: Payer: Self-pay | Admitting: Family Medicine

## 2022-11-06 NOTE — Telephone Encounter (Signed)
Noted  

## 2022-11-06 NOTE — Telephone Encounter (Signed)
[place] with [facility] -   [phone]  Wanting to inform MD that they received the home health request, but cannot reach Pt They tried calling spouse, as well, to no avail. They will keep trying, and keep MD posted.

## 2022-11-07 ENCOUNTER — Telehealth: Payer: Self-pay | Admitting: Family Medicine

## 2022-11-07 DIAGNOSIS — R258 Other abnormal involuntary movements: Secondary | ICD-10-CM

## 2022-11-07 DIAGNOSIS — I10 Essential (primary) hypertension: Secondary | ICD-10-CM

## 2022-11-07 MED ORDER — ROSUVASTATIN CALCIUM 20 MG PO TABS: 20.0000 mg | ORAL_TABLET | Freq: Every day | ORAL | 3 refills | Status: DC

## 2022-11-07 MED ORDER — AMLODIPINE BESYLATE 10 MG PO TABS: 10.0000 mg | ORAL_TABLET | Freq: Every day | ORAL | 1 refills | Status: DC

## 2022-11-07 MED ORDER — ROPINIROLE HCL 1 MG PO TABS: 1.0000 mg | ORAL_TABLET | Freq: Every day | ORAL | 2 refills | Status: DC

## 2022-11-07 NOTE — Telephone Encounter (Signed)
Rx's sent in. °

## 2022-11-07 NOTE — Telephone Encounter (Signed)
Pt has new pharm [facility] amLODipine (NORVASC) 10 MG tablet ,rOPINIRole (REQUIP) 1 MG table ,rosuvastatin (CRESTOR) 20 MG tablet  [place], [place] Phone: [phone]  Fax: [phone]

## 2022-11-08 ENCOUNTER — Encounter: Payer: Self-pay | Admitting: Internal Medicine

## 2022-11-08 ENCOUNTER — Ambulatory Visit: Payer: Medicare Other | Attending: Internal Medicine | Admitting: Internal Medicine

## 2022-11-08 VITALS — BP 92/55 | HR 98 | Resp 17 | Ht 72.0 in | Wt 273.0 lb

## 2022-11-08 DIAGNOSIS — G894 Chronic pain syndrome: Secondary | ICD-10-CM

## 2022-11-08 DIAGNOSIS — R768 Other specified abnormal immunological findings in serum: Secondary | ICD-10-CM | POA: Diagnosis not present

## 2022-11-08 DIAGNOSIS — M159 Polyosteoarthritis, unspecified: Secondary | ICD-10-CM | POA: Diagnosis not present

## 2022-11-08 DIAGNOSIS — E1142 Type 2 diabetes mellitus with diabetic polyneuropathy: Secondary | ICD-10-CM | POA: Diagnosis not present

## 2022-11-08 DIAGNOSIS — Z86711 Personal history of pulmonary embolism: Secondary | ICD-10-CM | POA: Diagnosis not present

## 2022-11-08 DIAGNOSIS — I272 Pulmonary hypertension, unspecified: Secondary | ICD-10-CM

## 2022-11-08 DIAGNOSIS — G4733 Obstructive sleep apnea (adult) (pediatric): Secondary | ICD-10-CM | POA: Diagnosis not present

## 2022-11-08 NOTE — Progress Notes (Signed)
Office Visit Note  Patient: Keith [name]             Date of Birth: 12-14-1950           MRN: [id number]             PCP: [name], [name], MD Referring: Larey Dresser, MD Visit Date: 11/08/2022 Occupation: Retired airborne, Mudlogger  Subjective:  New Patient (Initial Visit) (Total body joint pain)   History of Present Illness: Keith [name] is a 72 y.o. male here for evaluation of positive ANA and rheumatoid factor in association with diastolic heart failure and pulmonary hypertension.  He was hospitalized last year due to exacerbation of diastolic heart failure apparently primarily right-sided failure related to recurrent pulmonary embolus.  He has a previous history of large PE in 2021 and had been on Eliquis anticoagulation until 2022.  He did not require embolectomy and is now back on indefinite anticoagulation.  He follows up with heart failure clinic for medical management including riociguat for his pulmonary arterial hypertension.  He has had large improvement in his edema since taking the torsemide regularly.  He is also been successful with intentional weight loss since last year with reduction of added sugars and salts from his diet.  He had recent hospitalization again earlier this year due to pneumonia and finished a course of antibiotics for this within the past 2 weeks. He has chronic arthritis affecting multiple sites mostly throughout the lower body.  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  However his more recent problem started in 2014 with significant degenerative disease and spondylolisthesis and required surgery for L4-L5 which had very good improvement.  He fell in 2019 reinjuring his low back which required surgical revision.  This had led to some additional  complication requiring debridement of entrapping fibrosis around nerves of the lumbar spine this led to severe debility with almost complete loss of function in both legs and had to do physical therapy for months before he get back to walking with a walker.  He also has known considerable right knee osteoarthritis.  He experiences diabetic peripheral neuropathy affecting both feet.  He takes ropinirole for pretty severe restless leg symptoms. He does not have a known history of autoimmune disease but does have family history of rheumatoid arthritis on his mother side.   Labs reviewed 07/2022 ANA pos dsDNA, RNP, Sm, SSA, SSB, centromere neg RF 38.5  Activities of Daily Living:  Patient reports morning stiffness for 15 minutes.   Patient Reports nocturnal pain.  Difficulty dressing/grooming: Denies Difficulty climbing stairs: Reports Difficulty getting out of chair: Reports Difficulty using hands for taps, buttons, cutlery, and/or writing: Denies  Review of Systems  Constitutional:  Positive for fatigue.  HENT:  Negative for mouth sores and mouth dryness.   Eyes:  Negative for dryness.  Respiratory:  Positive for shortness of breath.   Cardiovascular:  Negative for chest pain and palpitations.  Gastrointestinal:  Negative for blood in stool, constipation and diarrhea.  Endocrine: Negative for increased urination.  Genitourinary:  Negative for involuntary urination.  Musculoskeletal:  Positive for joint pain, gait problem, joint pain, joint swelling, myalgias, muscle weakness, morning stiffness, muscle tenderness and myalgias.  Skin:  Negative for color change, rash, hair loss and sensitivity to sunlight.  Allergic/Immunologic:  Negative for susceptible to infections.  Neurological:  Negative for dizziness and headaches.  Hematological:  Negative for swollen glands.  Psychiatric/Behavioral:  Negative for depressed mood and sleep disturbance. The patient is not nervous/anxious.     PMFS  History:  Patient Active Problem List   Diagnosis Date Noted   Acute respiratory failure with hypoxia ([place]) 10/23/2022   Rheumatoid factor positive 10/23/2022   Chronic pulmonary hypertension ([place]) 10/23/2022   Chest pain 03/01/2022   Elevated liver enzymes 03/01/2022   Myofascial pain 10/30/2021   Controlled type 2 diabetes mellitus with hyperglycemia, without long-term current use of insulin (HCC)    Bilateral leg weakness 08/08/2021   CAP (community acquired pneumonia) 11/19/2020   Loculated pleural effusion 11/19/2020   DNR (do not resuscitate) 11/19/2020   Closed right ankle fracture    Labile blood glucose    Hypokalemia    Drug induced constipation    Debility    Hyponatremia    Diabetic peripheral neuropathy ([place])    Acute blood loss anemia    Chronic pain syndrome    Postoperative pain    Closed right trimalleolar fracture 07/02/2020   Chronic respiratory failure with hypoxia (HCC)    Lumbar spinal stenosis 06/01/2020   Spinal cord compression ([place]) 04/02/2020   Thoracic myelopathy 07/25/2018   Hyperlipidemia associated with type 2 diabetes mellitus ([place]) 11/14/2016   Type 2 diabetes mellitus with diabetic neuropathy, unspecified ([place]) 11/14/2016   Routine general medical examination at a health care facility 11/14/2015   Hiatal hernia 08/30/2014   CKD (chronic kidney disease), stage III ([place]) 04/10/2013   Allergic rhinitis 04/10/2013   Spinal stenosis of lumbar region 11/06/2012   Allergic rhinitis due to pollen 01/15/2011   Osteoarthritis 11/02/2008   HIP PAIN, LEFT 02/09/2008   Morbid obesity ([place]) BMI 41 11/07/2007   ERECTILE DYSFUNCTION, MILD 11/07/2007   Essential hypertension 11/07/2007    Past Medical History:  Diagnosis Date   Allergy    Chronic back pain    Diabetes mellitus without complication ([place])    DNR (do not resuscitate) 11/19/2020   Duodenal ulcer hemorrhage 08/29/2014   ED (erectile dysfunction)    Esophageal stricture 08/30/2014    Glucose intolerance (impaired glucose tolerance)    Hiatal hernia 08/30/2014   Hypertension    Hypokalemia 04/11/2013   MRSA carrier 08/30/2014   Obesity    Osteoarthritis    Pneumonia    Pulmonary embolism ([place]) 06/2020   Scoliosis 2016   Spinal stenosis of lumbar region    Urinary tract infection 04/11/2013    Family History  Problem Relation Age of Onset   Diabetes Mother    Asthma Mother    Breast cancer Mother    Hypertension Father    Stroke Father    Colon cancer Neg Hx    Past Surgical History:  Procedure Laterality Date   BACK SURGERY  08/07/2021   COLONOSCOPY  2008   ESOPHAGOGASTRODUODENOSCOPY N/A 08/29/2014   Procedure: ESOPHAGOGASTRODUODENOSCOPY (EGD);  Surgeon: Lafayette Dragon, MD;  Location: [facility];  Service: Endoscopy;  Laterality: N/A;   LAMINECTOMY N/A 07/28/2018   Procedure: THORACIC ELEVEN- THORACIC TWELVE POSTERIOR DECOMPRESSION LAMINECTOMY;  Surgeon: Judith Part, MD;  Location: [facility];  Service: Neurosurgery;  Laterality: N/A;   LUMBAR LAMINECTOMY     NASAL POLYP SURGERY     ORIF ANKLE FRACTURE Right 07/01/2020   Procedure: OPEN REDUCTION INTERNAL FIXATION (ORIF) ANKLE FRACTURE. TRIMALLEOLAR;  Surgeon: Marchia Bond, MD;  Location: [facility];  Service:  Orthopedics;  Laterality: Right;   RIGHT HEART CATH N/A 07/06/2022   Procedure: RIGHT HEART CATH;  Surgeon: Larey Dresser, MD;  Location: [facility];  Service: Cardiovascular;  Laterality: N/A;   RIGHT/LEFT HEART CATH AND CORONARY ANGIOGRAPHY N/A 03/02/2022   Procedure: RIGHT/LEFT HEART CATH AND CORONARY ANGIOGRAPHY;  Surgeon: Dixie Dials, MD;  Location: [facility];  Service: Cardiovascular;  Laterality: N/A;   TONSILLECTOMY     Social History   Social History Narrative   Married.     Immunization History  Administered Date(s) Administered   PFIZER(Purple Top)SARS-COV-2 Vaccination 12/20/2019, 01/09/2020, 09/24/2020   Pneumococcal Conjugate-13 11/11/2017, 11/24/2020    Pneumococcal Polysaccharide-23 11/09/2014, 03/30/2020   Td 10/09/2003   Tdap 11/09/2013     Objective: Vital Signs: BP (!) 92/55 (BP Location: Left Arm, Patient Position: Sitting, Cuff Size: Normal)   Pulse 98   Resp 17   Ht 6' (1.829 m)   Wt 273 lb (123.8 kg)   BMI 37.03 kg/m    Physical Exam Constitutional:      Appearance: He is obese.     Comments: In wheelchair  Eyes:     Conjunctiva/sclera: Conjunctivae normal.  Cardiovascular:     Rate and Rhythm: Normal rate and regular rhythm.  Pulmonary:     Effort: Pulmonary effort is normal.     Breath sounds: Wheezing (end expiratory) present.  Musculoskeletal:     Right lower leg: Edema present.     Left lower leg: Edema present.     Comments: 1+ pedal edema extending short distance above ankle more on left side Left shin chronic skin hyperpigmentation and hardening  Lymphadenopathy:     Cervical: No cervical adenopathy.  Skin:    General: Skin is warm and dry.  Neurological:     Mental Status: He is alert.  Psychiatric:        Mood and Affect: Mood normal.      Musculoskeletal Exam:  Shoulders full ROM no tenderness or swelling Elbows full ROM no tenderness or swelling Wrists full ROM no tenderness or swelling right wrist soft tissue widening on ulnar side Fingers full ROM no tenderness or swelling, mild 1st CMC joint squaring Knees full ROM no tenderness or swelling Ankle ROM severely limited both sides, externally rotation left foot, right ankle crepitus   Investigation: No additional findings.  Imaging: CT Angio Chest PE W and/or Wo Contrast  Result Date: 10/23/2022 CLINICAL DATA:  Progressively worsening cough, congestion, and shortness of breath over the past 2 weeks. EXAM: CT ANGIOGRAPHY CHEST WITH CONTRAST TECHNIQUE: Multidetector CT imaging of the chest was performed using the standard protocol during bolus administration of intravenous contrast. Multiplanar CT image reconstructions and MIPs were obtained  to evaluate the vascular anatomy. RADIATION DOSE REDUCTION: This exam was performed according to the departmental dose-optimization program which includes automated exposure control, adjustment of the mA and/or kV according to patient size and/or use of iterative reconstruction technique. CONTRAST:  173m OMNIPAQUE IOHEXOL 350 MG/ML SOLN COMPARISON:  Chest x-ray from same day. CT chest dated December 20, 2020. FINDINGS: Cardiovascular: Satisfactory opacification of the pulmonary arteries to the segmental level. No evidence of pulmonary embolism. Normal heart size. No pericardial effusion. No thoracic aortic aneurysm or dissection. Coronary, aortic arch, and branch vessel atherosclerotic vascular disease. Mediastinum/Nodes: No enlarged mediastinal, hilar, or axillary lymph nodes. Thyroid gland, trachea, and esophagus demonstrate no significant findings. Lungs/Pleura: Mild central peribronchial thickening. Patchy consolidation and irregular peribronchovascular ground-glass densities in the left lower  lobe. There are a few small irregular peribronchovascular ground-glass densities in the left upper lobe and right lower lobe. No pleural effusion or pneumothorax. Unchanged 5 mm left upper lobe nodule (series 6, image 63). Unchanged 3 mm right middle lobe nodule (series 6, image 69). These are considered benign given stability. No follow-up imaging is recommended. Upper Abdomen: No acute abnormality. Unchanged left adrenal adenoma. No follow-up imaging is recommended. Musculoskeletal: No chest wall abnormality. Interval T11-T12 posterior fusion. Lucency around the right T11 pedicle screw. The left T11 pedicle screw has minimal vertebral body purchase and mostly extends into the T11-T12 disc space. The right T12 pedicle screw extends along the lateral aspect of the vertebral body with minimal bony purchase. Review of the MIP images confirms the above findings. IMPRESSION: 1. No evidence of pulmonary embolism. 2. Findings  consistent with multifocal bronchopneumonia, worse in the left lower lobe. 3. Interval T11-T12 posterior fusion with loosening of the right T11 pedicle screw and poor bony purchase of the left T11 and right T12 pedicle screws as described above. 4.  Aortic atherosclerosis (ICD10-I70.0). Electronically Signed   By: Titus Dubin M.D.   On: 10/23/2022 11:41   DG Chest Port 1 View  Result Date: 10/23/2022 CLINICAL DATA:  Shortness of breath. Ongoing cold symptoms for 2 weeks, progressively getting worse. Cough and congestion. EXAM: PORTABLE CHEST 1 VIEW COMPARISON:  Chest radiograph 02/28/2022. FINDINGS: Right lung is clear. Questionable patchy left retrocardiac opacity. Stable cardiomegaly and mediastinal contours with tortuosity of the aorta, likely exaggerated by scoliotic curvature of the mid and lower thoracic spine. No pleural effusion or pneumothorax. IMPRESSION: Query patchy left retrocardiac opacity. Consider PA and lateral radiographs for further characterization. Electronically Signed   By: Emmit Alexanders M.D   On: 10/23/2022 10:06    Recent Labs: Lab Results  Component Value Date   WBC 7.6 11/02/2022   HGB 11.0 (L) 11/02/2022   PLT 412.0 (H) 11/02/2022   NA 139 11/02/2022   K 3.8 11/02/2022   CL 103 11/02/2022   CO2 27 11/02/2022   GLUCOSE 139 (H) 11/02/2022   BUN 15 11/02/2022   CREATININE 1.03 11/02/2022   BILITOT 0.4 10/23/2022   ALKPHOS 72 10/23/2022   AST 29 10/23/2022   ALT 22 10/23/2022   PROT 8.3 (H) 10/23/2022   ALBUMIN 4.1 10/23/2022   CALCIUM 10.4 11/02/2022   GFRAA >60 07/11/2020    Speciality Comments: No specialty comments available.  Procedures:  No procedures performed Allergies: Aspirin and Lisinopril   Assessment / Plan:     Visit Diagnoses: Positive ANA (antinuclear antibody)  Rheumatoid factor positive - Plan: Rheumatoid factor, Cyclic citrul peptide antibody, IgG, Sedimentation rate, C-reactive protein, Anti-scleroderma antibody, C3 and C4, Beta-2  glycoprotein antibodies, Cardiolipin antibodies, IgG, IgM, IgA, Phosphatidylserine/Prothrombin Ab (IgG), Phosphatidylserine/Prothrombin Ab (IgM) , ANA  Discussed his positive ANA and rheumatoid factor abnormal but somewhat nonspecific labs needing further workup today.  I do not see any evidence of peripheral joint synovitis on exam today.  No specific clinical criteria.  I do not see any peripheral vascular complications or evidence of secondary Raynaud's.  Will recheck the rheumatoid factor titer as well as checking for staining pattern of ANA as well as a few additional inflammatory and antibody markers.   Chronic pulmonary hypertension (HCC)  Clinically appears fairly well-controlled on current medication regimen recorded blood pressure is low but well-tolerated and has minimal peripheral edema on exam.  He reports discussion about possibly trying to resume physical therapy for his  chronic pain and leg weakness although he tends to be most limited by fatigue and shortness of breath with exertion.  Recommend he could discuss with cardiology whether there is any further optimization that might improve his ability to perform general PT or if cardiopulmonary rehab would be useful option to consider.  Diabetic peripheral neuropathy (HCC) Osteoarthritis of multiple joints, unspecified osteoarthritis type Chronic pain syndrome  Chronic pain is multifactorial with some degenerative joint damage affecting his low back right hip and right knee.  Probably also exacerbated by relative deconditioning of his lower extremities.  Also has a component of peripheral neuropathy may be worsening pain.  History and distribution appear consistent with diabetic neuropathy.  He is already established with provider for chronic pain management with multiple oral medications and recent hip injection.  History of pulmonary embolism  With a history of recurrent thrombotic events and positive ANA will check for  antiphospholipid antibodies as well.  Orders: Orders Placed This Encounter  Procedures   Rheumatoid factor   Cyclic citrul peptide antibody, IgG   Sedimentation rate   C-reactive protein   Anti-scleroderma antibody   C3 and C4   Beta-2 glycoprotein antibodies   Cardiolipin antibodies, IgG, IgM, IgA   Phosphatidylserine/Prothrombin Ab (IgG)   Phosphatidylserine/Prothrombin Ab (IgM)    ANA   No orders of the defined types were placed in this encounter.   Follow-Up Instructions: Return in about 4 weeks (around 12/06/2022) for New pt +RF/PAH f/u 79mo   CCollier Salina MD  Note - This record has been created using [employer]  Chart creation errors have been sought, but may not always  have been located. Such creation errors do not reflect on  the standard of medical care.

## 2022-11-09 ENCOUNTER — Telehealth: Payer: Self-pay | Admitting: Family Medicine

## 2022-11-09 NOTE — Progress Notes (Signed)
Patient ID: Keith [name], male   DOB: December 31, 1950, 72 y.o.   MRN: [id number]  Care Management & Coordination Services Pharmacy Team  Reason for Encounter: Schedule with Clinical Pharmacist Theo Dills.    Call to patient offered to schedule him with Theo Dills Clinical Pharmacist. What concerns do you have about your medications? Patient reports he has changed pharmacies to [facility] due to not being satisfied with [facility]. He further reports his Pain management doctor is retired as of now and he wont have anyone to fill his Duloxetine, he is taking and was wondering what he can do if PCP is willing to manage or if he should be referred to another pain management Dr. He reports he still has a good supply of medication right now but wants to keep ahead of it before running out so is ok to wait for call to discuss with Pharmacist.  Chart Updates:  Recent office visits:  11/02/22 [name], [name], MD - Patient presented for Diabetic peripheral neuropathy and other concerns. No medication changes.   Recent consult visits:  10/22/22 Sueanne Margarita, MD (Cardiology) - Patient presented for OSA and other concerns. No medication changes.   11/06/22 Patient presented to [facility] for pain in right hip and other concerns. No medication changes. Cortisone Inj administered.   09/13/22 Larey Dresser, MD (Cardiology) - Patient presented for Chronic diastolic heart failure and other concerns. No medication changes.   Hospital visits:  Medication Reconciliation was completed by comparing discharge summary, patient's EMR and Pharmacy list, and upon discussion with patient.  Patient presented to [facility] on 10/23/22 due to RSV. Patient was present for 2 days.  New?Medications Started at [facility] Discharge:?? -started  albuterol (VENTOLIN HFA) amoxicillin-clavulanate (AUGMENTIN  Medication Changes at Hospital Discharge: -Changed  none  Medications Discontinued at Hospital Discharge: -Stopped   none  Medications that remain the same after Hospital Discharge:??  -All other medications will remain the same.    Medications: Outpatient Encounter Medications as of 11/09/2022  Medication Sig Note   acetaminophen (TYLENOL) 325 MG tablet Take 1-2 tablets (325-650 mg total) by mouth every 4 (four) hours as needed for mild pain. (Patient not taking: Reported on 11/08/2022) 10/23/2022: prn   albuterol (VENTOLIN HFA) 108 (90 Base) MCG/ACT inhaler Inhale 2 puffs into the lungs every 6 (six) hours as needed for wheezing or shortness of breath.    amLODipine (NORVASC) 10 MG tablet Take 1 tablet (10 mg total) by mouth daily.    apixaban (ELIQUIS) 5 MG TABS tablet Take 1 tablet (5 mg total) by mouth 2 (two) times daily.    Blood Glucose Monitoring Suppl (ACCU-CHEK GUIDE ME) w/Device KIT USE TO TEST BLOOD SUGARS 1-2 TIMES DAILY.    carvedilol (COREG) 12.5 MG tablet Take 1 tablet (12.5 mg total) by mouth 2 (two) times daily with a meal.    cyanocobalamin (VITAMIN B12) 1000 MCG tablet Take 1,000 mcg by mouth daily.    DULoxetine (CYMBALTA) 60 MG capsule TAKE 1 CAPSULE BY MOUTH EVERY DAY    ferrous sulfate 325 (65 FE) MG EC tablet Take 325 mg by mouth every other day. 10/23/2022: prn   gabapentin (NEURONTIN) 300 MG capsule Take 300 mg by mouth 3 (three) times daily. (Patient not taking: Reported on 11/08/2022)    gabapentin (NEURONTIN) 400 MG capsule Take 400 mg by mouth 3 (three) times daily.    HYDROcodone-acetaminophen (NORCO) 10-325 MG tablet Take 1 tablet by mouth in the morning, at noon, and  at bedtime. (Patient not taking: Reported on 11/08/2022)    HYDROcodone-acetaminophen (NORCO) 7.5-325 MG tablet take 1 (one) tablet four times daily as needed for pain    hydrocortisone (ANUSOL-HC) 2.5 % rectal cream Place 1 application. rectally as needed for hemorrhoids or anal itching. 10/23/2022: prn   levETIRAcetam (KEPPRA) 250 MG tablet TAKE 1 TABLET BY MOUTH TWICE A DAY    lidocaine (LIDODERM) 5 % Place 1 patch  onto the skin daily as needed (pain). Remove & Discard patch within 12 hours or as directed by MD 10/23/2022: prn   naloxone (NARCAN) nasal spray 4 mg/0.1 mL Place 1 spray into the nose as needed (accidental overdose). 10/23/2022: prn   polyethylene glycol (MIRALAX / GLYCOLAX) 17 g packet Take 17 g by mouth daily as needed for mild constipation. (Patient not taking: Reported on 11/08/2022) 10/23/2022: prn   Riociguat (ADEMPAS) 1.5 MG TABS Take 1.5 mg by mouth in the morning, at noon, and at bedtime.    rOPINIRole (REQUIP) 1 MG tablet Take 1 tablet (1 mg total) by mouth at bedtime.    rosuvastatin (CRESTOR) 20 MG tablet Take 1 tablet (20 mg total) by mouth daily.    spironolactone (ALDACTONE) 25 MG tablet Take 1 tablet (25 mg total) by mouth daily.    torsemide (DEMADEX) 20 MG tablet Take 1 tablet (20 mg total) by mouth daily.    traZODone (DESYREL) 50 MG tablet TAKE 1/2 TO 1 TABLET BY MOUTH AT BEDTIME AS NEEDED FOR SLEEP 10/23/2022: prn   No facility-administered encounter medications on file as of 11/09/2022.    Recent vitals BP Readings from Last 3 Encounters:  11/08/22 (!) 92/55  11/02/22 118/64  10/25/22 127/82   Pulse Readings from Last 3 Encounters:  11/08/22 98  11/02/22 94  10/25/22 94   Wt Readings from Last 3 Encounters:  11/08/22 273 lb (123.8 kg)  10/23/22 273 lb (123.8 kg)  10/22/22 273 lb (123.8 kg)   BMI Readings from Last 3 Encounters:  11/08/22 37.03 kg/m  11/02/22 37.03 kg/m  10/23/22 37.03 kg/m    Recent lab results    Component Value Date/Time   NA 139 11/02/2022 1428   K 3.8 11/02/2022 1428   CL 103 11/02/2022 1428   CO2 27 11/02/2022 1428   GLUCOSE 139 (H) 11/02/2022 1428   GLUCOSE 100 (H) 09/09/2006 0956   BUN 15 11/02/2022 1428   CREATININE 1.03 11/02/2022 1428   CREATININE 1.70 (H) 03/09/2022 1542   CALCIUM 10.4 11/02/2022 1428    Lab Results  Component Value Date   CREATININE 1.03 11/02/2022   GFR 73.71 11/02/2022   GFRNONAA >60 10/25/2022    GFRAA >60 07/11/2020   Lab Results  Component Value Date/Time   HGBA1C 6.1 11/02/2022 02:28 PM   HGBA1C 6.2 07/18/2022 11:34 AM   HGBA1C 6.6 (H) 01/17/2022 11:40 AM   FRUCTOSAMINE 275 11/19/2018 02:42 PM   MICROALBUR 11.3 (H) 01/17/2022 11:40 AM   MICROALBUR 8.1 (H) 03/30/2020 04:50 PM    Lab Results  Component Value Date   CHOL 140 01/17/2022   HDL 50.80 01/17/2022   LDLCALC 73 01/17/2022   TRIG 81.0 01/17/2022   CHOLHDL 3 01/17/2022    Care Gaps: Eye Exam - Overdue Zoster Vaccine - Overdue COVID Booster - Overdue Flu Vaccine - Postponed   Star Rating Drugs:  Rosuvastatin 20 mg - Last filled 11/07/22 90 DS at [facility] Pharmacist Assistant [phone]

## 2022-11-15 LAB — C3 AND C4
C3 Complement: 156 mg/dL (ref 82–185)
C4 Complement: 41 mg/dL (ref 15–53)

## 2022-11-15 LAB — C-REACTIVE PROTEIN: CRP: 9.9 mg/L — ABNORMAL HIGH (ref <8.0)

## 2022-11-15 LAB — PHOSPHATIDYLSERINE/PROTHROMBIN AB (IGM): Phosphatidylserine/Prothrombin Ab (IgM): 34 U — ABNORMAL HIGH (ref <=30)

## 2022-11-15 LAB — ANTI-SCLERODERMA ANTIBODY: Scleroderma (Scl-70) (ENA) Antibody, IgG: 1.4 AI — AB

## 2022-11-15 LAB — BETA-2 GLYCOPROTEIN ANTIBODIES
Beta-2 Glyco 1 IgA: <2 U/mL (ref <20.0)
Beta-2 Glyco 1 IgM: <2 U/mL (ref <20.0)
Beta-2 Glyco I IgG: <2 U/mL (ref <20.0)

## 2022-11-15 LAB — CARDIOLIPIN ANTIBODIES, IGG, IGM, IGA
Anticardiolipin IgA: <2 APL-U/mL (ref <20.0)
Anticardiolipin IgG: <2 GPL-U/mL (ref <20.0)
Anticardiolipin IgM: <2 MPL-U/mL (ref <20.0)

## 2022-11-15 LAB — RHEUMATOID FACTOR: Rheumatoid fact SerPl-aCnc: 42 IU/mL — ABNORMAL HIGH (ref <14)

## 2022-11-15 LAB — PHOSPHATIDYLSERINE/PROTHROMBIN AB (IGG): Phosphatidylserine/Prothrombin Ab (IgG): 13 U (ref <=30)

## 2022-11-15 LAB — CYCLIC CITRUL PEPTIDE ANTIBODY, IGG: Cyclic Citrullin Peptide Ab: <16 UNITS

## 2022-11-15 LAB — ANA: Anti Nuclear Antibody (ANA): NEGATIVE

## 2022-11-15 LAB — SEDIMENTATION RATE: Sed Rate: 50 mm/h — ABNORMAL HIGH (ref 0–20)

## 2022-11-15 NOTE — Progress Notes (Signed)
We need to schedule a follow up appointment as he has numerous positive lab results and may benefit with additional treatment. Rheumatoid factor remains positive 42 from 38.5. His sedimentation rate is 50 which can indicate ongoing inflammation. The other antibody tests are very unclear as the ANA is now negative but now some others are low positives.

## 2022-11-15 NOTE — Progress Notes (Signed)
Care Management & Coordination Services Pharmacy Note  11/15/2022 Name:  Keith [name] MRN:  [id number] DOB:  Jun 09, 1951  Summary: -Pt reports his pain mgmt dr has now retired, requests appt w/ PCP for 3/8 to discuss potential of new referral or PCP taking over mgmt -Checks BP twice daily and reports readings WNL, denies any fluid swelling -Adherent to medications and denies any signs of bleed with eliquis  Recommendations/Changes made from today's visit: -Continue medication therapy -Schedule appt for diabetic eye exam and dental visit -Continue to limit salt/carbs for BP/sugar control -F/U visit with PCP sch for 3/8, per patient request  Follow up plan: HTN review call in 4 months Pharmacist visit in 6 months   Subjective: Keith [name] is an 72 y.o. year old male who is a primary patient of [name], Malka So, MD.  The care coordination team was consulted for assistance with disease management and care coordination needs.    Engaged with patient by telephone for follow up visit.  Recent office visits: 11/02/22 [name], [name], MD - Patient presented for Diabetic peripheral neuropathy and other concerns. No medication changes   Recent consult visits: 11/08/22 Collier Salina, MD (Rheumatology) - For assessment of total body joint pain and determine potential RA diagnosis. F/u in 1 month, no medication changes.  10/22/22 Sueanne Margarita, MD (Cardiology) - Patient presented for OSA and other concerns. No medication changes.    11/06/22 Patient presented to [facility] for pain in right hip and other concerns. No medication changes. Cortisone Inj administered.    09/13/22 Larey Dresser, MD (Cardiology) - Patient presented for Chronic diastolic heart failure and other concerns. No medication changes.   Hospital visits: 10/23/22: [facility] x 2 days for RSV. START Albuterol HFA and Augmentin   Objective:  Lab Results  Component Value Date   CREATININE 1.03  11/02/2022   BUN 15 11/02/2022   GFR 73.71 11/02/2022   GFRNONAA >60 10/25/2022   GFRAA >60 07/11/2020   NA 139 11/02/2022   K 3.8 11/02/2022   CALCIUM 10.4 11/02/2022   CO2 27 11/02/2022   GLUCOSE 139 (H) 11/02/2022    Lab Results  Component Value Date/Time   HGBA1C 6.1 11/02/2022 02:28 PM   HGBA1C 6.2 07/18/2022 11:34 AM   HGBA1C 6.6 (H) 01/17/2022 11:40 AM   FRUCTOSAMINE 275 11/19/2018 02:42 PM   GFR 73.71 11/02/2022 02:28 PM   GFR 40.06 (L) 01/17/2022 11:40 AM   MICROALBUR 11.3 (H) 01/17/2022 11:40 AM   MICROALBUR 8.1 (H) 03/30/2020 04:50 PM    Last diabetic Eye exam: No results found for: "HMDIABEYEEXA"  Last diabetic Foot exam: No results found for: "HMDIABFOOTEX"   Lab Results  Component Value Date   CHOL 140 01/17/2022   HDL 50.80 01/17/2022   LDLCALC 73 01/17/2022   TRIG 81.0 01/17/2022   CHOLHDL 3 01/17/2022       Latest Ref Rng & Units 10/23/2022   10:00 AM 03/05/2022   12:34 AM 03/04/2022    1:13 AM  Hepatic Function  Total Protein 6.5 - 8.1 g/dL 8.3   7.6   Albumin 3.5 - 5.0 g/dL 4.1  3.5  3.5   AST 15 - 41 U/L 29   30   ALT 0 - 44 U/L 22   55   Alk Phosphatase 38 - 126 U/L 72   106   Total Bilirubin 0.3 - 1.2 mg/dL 0.4   0.4     Lab Results  Component  Value Date/Time   TSH 0.80 11/11/2017 09:20 AM   TSH 0.58 11/05/2016 08:37 AM       Latest Ref Rng & Units 11/02/2022    2:28 PM 10/25/2022    6:34 AM 10/24/2022    6:10 AM  CBC  WBC 4.0 - 10.5 K/uL 7.6  10.5  12.7   Hemoglobin 13.0 - 17.0 g/dL 11.0  10.6  10.1   Hematocrit 39.0 - 52.0 % 33.7  34.1  32.2   Platelets 150.0 - 400.0 K/uL 412.0  321  303     Lab Results  Component Value Date/Time   VITAMINB12 1,258 (H) 04/10/2013 08:23 PM    Clinical ASCVD: No  The 10-year ASCVD risk score (Arnett DK, et al., 2019) is: 17.3%   Values used to calculate the score:     Age: 57 years     Sex: Male     Is Non-Hispanic African American: Yes     Diabetic: Yes     Tobacco smoker: No      Systolic Blood Pressure: 92 mmHg     Is BP treated: Yes     HDL Cholesterol: 50.8 mg/dL     Total Cholesterol: 140 mg/dL         11/02/2022    1:54 PM 06/06/2022   11:02 AM 05/25/2022   11:39 AM  Depression screen PHQ 2/9  Decreased Interest 0 0 0  Down, Depressed, Hopeless 0 0 0  PHQ - 2 Score 0 0 0     Social History   Tobacco Use  Smoking Status Former   Packs/day: 1.00   Years: 25.00   Total pack years: 25.00   Types: Cigarettes   Quit date: 1995   Years since quitting: 29.1   Passive exposure: Never  Smokeless Tobacco Never   BP Readings from Last 3 Encounters:  11/08/22 (!) 92/55  11/02/22 118/64  10/25/22 127/82   Pulse Readings from Last 3 Encounters:  11/08/22 98  11/02/22 94  10/25/22 94   Wt Readings from Last 3 Encounters:  11/08/22 273 lb (123.8 kg)  10/23/22 273 lb (123.8 kg)  10/22/22 273 lb (123.8 kg)   BMI Readings from Last 3 Encounters:  11/08/22 37.03 kg/m  11/02/22 37.03 kg/m  10/23/22 37.03 kg/m    Allergies  Allergen Reactions   Aspirin Other (See Comments)    Irritates the stomach and the patient developed ulcers, also   Lisinopril Other (See Comments)    Caused a body ache    Medications Reviewed Today     Reviewed by Shona Needles, RT on 11/08/22 at [place] List Status: <None>   Medication Order Taking? Sig Documenting Provider Last Dose Status Informant  acetaminophen (TYLENOL) 325 MG tablet [id number] No Take 1-2 tablets (325-650 mg total) by mouth every 4 (four) hours as needed for mild pain.  Patient not taking: Reported on 11/08/2022   Flora Lipps Not Taking Active Self           Med Note Nigel Mormon, [facility]   Tue Oct 23, 2022  1:28 PM) prn  albuterol (VENTOLIN HFA) 108 (90 Base) MCG/ACT inhaler [id number] Yes Inhale 2 puffs into the lungs every 6 (six) hours as needed for wheezing or shortness of breath. Damita Lack, MD Taking Active   amLODipine (NORVASC) 10 MG tablet [id number] Yes Take 1 tablet  (10 mg total) by mouth daily. [name], [name], MD Taking Active   apixaban (ELIQUIS) 5 MG TABS  tablet [id number] Yes Take 1 tablet (5 mg total) by mouth 2 (two) times daily. Nita Sells, MD Taking Active Self  Blood Glucose Monitoring Suppl (ACCU-CHEK GUIDE ME) w/Device KIT [id number] Yes USE TO TEST BLOOD SUGARS 1-2 TIMES DAILY. [name], [name], MD Taking Active Self  carvedilol (COREG) 12.5 MG tablet [id number] Yes Take 1 tablet (12.5 mg total) by mouth 2 (two) times daily with a meal. Larey Dresser, MD Taking Active Self  cyanocobalamin (VITAMIN B12) 1000 MCG tablet [id number] Yes Take 1,000 mcg by mouth daily. [provider] Taking Active Self  DULoxetine (CYMBALTA) 60 MG capsule [id number] Yes TAKE 1 CAPSULE BY MOUTH EVERY DAY Lovorn, Megan, MD Taking Active Self  ferrous sulfate 325 (65 FE) MG EC tablet [id number] Yes Take 325 mg by mouth every other day. [provider] Taking Active Self           Med Note Nigel Mormon, Mickel Crow   Tue Oct 23, 2022  1:28 PM) prn  gabapentin (NEURONTIN) 300 MG capsule [id number] Yes Take 300 mg by mouth 3 (three) times daily. [provider] Taking Active Self  HYDROcodone-acetaminophen (NORCO) 10-325 MG tablet [id number] No Take 1 tablet by mouth in the morning, at noon, and at bedtime.  Patient not taking: Reported on 11/08/2022   [provider] Not Taking Active Self  HYDROcodone-acetaminophen (NORCO) 7.5-325 MG tablet [id number] Yes take 1 (one) tablet four times daily as needed for pain  Taking Active   hydrocortisone (ANUSOL-HC) 2.5 % rectal cream [id number] Yes Place 1 application. rectally as needed for hemorrhoids or anal itching. [provider] Taking Active Self           Med Note Eino Farber   Tue Oct 23, 2022  1:29 PM) prn  levETIRAcetam (KEPPRA) 250 MG tablet [id number] Yes TAKE 1 TABLET BY MOUTH TWICE A DAY Lovorn, Megan, MD Taking Active Self  lidocaine (LIDODERM) 5 % [id number] Yes Place  1 patch onto the skin daily as needed (pain). Remove & Discard patch within 12 hours or as directed by MD [provider] Taking Active Self           Med Note Nigel Mormon, KIMBERLY L   Tue Oct 23, 2022  1:29 PM) prn  naloxone [facility]) nasal spray 4 mg/0.1 mL [id number] Yes Place 1 spray into the nose as needed (accidental overdose). [provider] Taking Active Self           Med Note Nigel Mormon, [facility]   Tue Oct 23, 2022  1:30 PM) prn  polyethylene glycol (MIRALAX / GLYCOLAX) 17 g packet [id number] No Take 17 g by mouth daily as needed for mild constipation.  Patient not taking: Reported on 11/08/2022   Flora Lipps Not Taking Active Self           Med Note Nigel Mormon, Mickel Crow   Tue Oct 23, 2022  1:30 PM) prn  Riociguat (ADEMPAS) 1.5 MG TABS [id number] Yes Take 1.5 mg by mouth in the morning, at noon, and at bedtime. [provider] Taking Active Self  rOPINIRole (REQUIP) 1 MG tablet [id number] Yes Take 1 tablet (1 mg total) by mouth at bedtime. [name], [name], MD Taking Active   rosuvastatin (CRESTOR) 20 MG tablet [id number] Yes Take 1 tablet (20 mg total) by mouth daily. [name], [name], MD Taking Active   spironolactone (ALDACTONE) 25 MG tablet [id number] Yes Take 1 tablet (25 mg total) by mouth daily. Larey Dresser, MD Taking  Active Self  torsemide (DEMADEX) 20 MG tablet [id number] Yes Take 1 tablet (20 mg total) by mouth daily. Larey Dresser, MD Taking Active Self  traZODone (DESYREL) 50 MG tablet [id number] Yes TAKE 1/2 TO 1 TABLET BY MOUTH AT BEDTIME AS NEEDED FOR SLEEP Lovorn, Jinny Blossom, MD Taking Active Self           Med Note Eino Farber   Tue Oct 23, 2022  1:31 PM) prn            SDOH:  (Social Determinants of Health) assessments and interventions performed: Yes SDOH Interventions    Flowsheet Row Telephone from 10/26/2022 in [place] ED to Hosp-Admission (Discharged) from 10/23/2022 in  [place] Patient Outreach Telephone from 05/25/2022 in [facility] ED to Hosp-Admission (Discharged) from 02/28/2022 in [place] Management from 12/27/2021 in [place] [facility] [place] Interventions Intervention Not Indicated Intervention Not Indicated Intervention Not Indicated Intervention Not Indicated --  Housing Interventions -- Intervention Not Indicated Intervention Not Indicated Intervention Not Indicated --  Transportation Interventions Intervention Not Indicated Intervention Not Indicated Intervention Not Indicated Other (Comment)  [Pt's spouse can help with transportation] Other (Comment)  [Provided cone transportation phone number]  Utilities Interventions -- Intervention Not Indicated -- -- --  Financial Strain Interventions -- -- -- Intervention Not Indicated Intervention Not Indicated       Medication Assistance: None required.  Patient affirms current coverage meets needs.  Medication Access: Within the past 30 days, how often has patient missed a dose of medication? None Is a pillbox or other method used to improve adherence? Yes  Factors that may affect medication adherence? no barriers identified Are meds synced by current pharmacy? No  Are meds delivered by current pharmacy? Yes  Does patient experience delays in picking up medications due to transportation concerns? No   [facility] Reviewed: Is patient disadvantaged to use [facility]?: Yes  Current Rx insurance plan: [facility] Name and location of Current pharmacy:  [place], [address] [place] [address] Phone: [phone] Fax: [place], [place] [address] [place] [address] Phone:  [phone] Fax: [phone]  [facility] services reviewed with patient today?: No  Patient requests to transfer care to [facility]?: No  Reason patient declined to change pharmacies: Disadvantaged due to insurance/mail order  Compliance/Adherence/Medication fill history: Care Gaps: Diabetic eye exam Vaccines: Shingles/COVID  Star-Rating Drugs: Rosuvastatin 20 mg 100% PDC   Assessment/Plan   Diabetes (A1c goal <7%) -Controlled -Current medications: None -Medications previously tried: Metformin  -Current home glucose readings fDoes not currently checked due to no medications and well-controlled, does not see need but has a meter if he wants to -Denies hypoglycemic/hyperglycemic symptoms -Current meal patterns:  Not discussed -Current exercise: Walks with walker as tolerated for short distances, continues to improve from this time last year when unable to walk at all (per pt) -Educated on A1c and blood sugar goals; Benefits of weight loss; Prevention and management of hypoglycemic episodes; -Counseled on regular dental visits -Counseled to check feet daily and get yearly eye exams -Counseled on diet and exercise extensively  Hypertension (BP goal <130/80) -Controlled -Current treatment: Amlodipine 32m 1 tab daily  Appropriate, Effective, Safe, Accessible Carvedilol 12.61m 1 tab BID Appropriate, Effective, Safe, Accessible Spironolactone 251m1 tab qd Appropriate, Effective, Safe, Accessible Torsemide 2051m tab qd Appropriate, Effective, Safe, Accessible -Medications previously tried: Chlorthalidone, Clonidine  -Current home readings: no specific numbers, WNL -Current dietary habits: has completely eliminated sweets and adds salt to nothing -Current exercise habits: see above -Checks bp twice daily (once in morning and around 5pm) -Denies hypotensive/hypertensive symptoms -Educated on BP goals and benefits of medications for prevention of heart attack,  stroke and kidney damage; Daily salt intake goal < 2300 mg; Importance of home blood pressure monitoring; -Counseled to monitor BP at home twice daily as desired, document, and provide log at future appointments -Recommended to continue current medication   [place] [phone]

## 2022-11-16 ENCOUNTER — Ambulatory Visit: Payer: Medicare Other

## 2022-11-19 ENCOUNTER — Telehealth: Payer: Self-pay

## 2022-11-19 DIAGNOSIS — G8929 Other chronic pain: Secondary | ICD-10-CM

## 2022-11-19 DIAGNOSIS — G9520 Unspecified cord compression: Secondary | ICD-10-CM

## 2022-11-19 NOTE — Telephone Encounter (Signed)
-----   Message from [name] [name], MD sent at 11/17/2022 11:53 PM EST ----- Regarding: Pain management Referral to pain management can be placed. Thanks, BJ

## 2022-11-23 ENCOUNTER — Telehealth: Payer: Self-pay

## 2022-11-23 NOTE — Telephone Encounter (Signed)
Per Dr. Radford Pax, explained to patient that a note was faxed to Cpap DME company explaining patient was in the hospital with RSV and should not have equipment revoked while he recovers. Patient states he is still using cpap and states he has been improving since he had RSV. Letter sent to patient for his records.

## 2022-11-27 DIAGNOSIS — M25551 Pain in right hip: Secondary | ICD-10-CM | POA: Diagnosis not present

## 2022-11-27 DIAGNOSIS — Z5320 Procedure and treatment not carried out because of patient's decision for unspecified reasons: Secondary | ICD-10-CM | POA: Diagnosis not present

## 2022-11-27 DIAGNOSIS — Z79899 Other long term (current) drug therapy: Secondary | ICD-10-CM | POA: Diagnosis not present

## 2022-11-27 DIAGNOSIS — M549 Dorsalgia, unspecified: Secondary | ICD-10-CM | POA: Diagnosis not present

## 2022-11-27 DIAGNOSIS — G8929 Other chronic pain: Secondary | ICD-10-CM | POA: Diagnosis not present

## 2022-11-27 DIAGNOSIS — M25571 Pain in right ankle and joints of right foot: Secondary | ICD-10-CM | POA: Diagnosis not present

## 2022-11-27 DIAGNOSIS — Z9181 History of falling: Secondary | ICD-10-CM | POA: Diagnosis not present

## 2022-11-30 ENCOUNTER — Other Ambulatory Visit: Payer: Self-pay

## 2022-11-30 MED ORDER — CARVEDILOL 12.5 MG PO TABS: 12.5000 mg | ORAL_TABLET | Freq: Two times a day (BID) | ORAL | 3 refills | Status: DC

## 2022-11-30 MED ORDER — SPIRONOLACTONE 25 MG PO TABS: 25.0000 mg | ORAL_TABLET | Freq: Every day | ORAL | 3 refills | Status: DC

## 2022-12-02 ENCOUNTER — Other Ambulatory Visit: Payer: Self-pay | Admitting: Physical Medicine and Rehabilitation

## 2022-12-04 ENCOUNTER — Telehealth: Payer: Self-pay

## 2022-12-04 MED ORDER — LEVETIRACETAM 250 MG PO TABS: 250.0000 mg | ORAL_TABLET | Freq: Two times a day (BID) | ORAL | 0 refills | Status: DC

## 2022-12-04 MED ORDER — TRAZODONE HCL 50 MG PO TABS: 25.0000 mg | ORAL_TABLET | Freq: Every evening | ORAL | 0 refills | Status: DC | PRN

## 2022-12-04 MED ORDER — DULOXETINE HCL 60 MG PO CPEP: ORAL_CAPSULE | ORAL | 0 refills | Status: DC

## 2022-12-04 NOTE — Telephone Encounter (Signed)
Refills sent in with a note for patient to call for an appointment

## 2022-12-05 NOTE — Progress Notes (Signed)
Office Visit Note  Patient: Keith [name]             Date of Birth: [birth year]           MRN: [id number]             PCP: [name], [name], MD Referring: [name], [name], MD Visit Date: 12/06/2022   Subjective:  Follow-up   History of Present Illness: Keith [name] is a 72 y.o. male here for follow up for abnormal lab results checked associated with pulmonary arterial hypertension.  Results from initial visit demonstrated a very low positive SCL antibody titer moderately high sedimentation rate and persistent rheumatoid factor positivity. He was referred to pain management due to ongoing problems with extensive degenerative arthritis and posttraumatic arthritis joint pain in multiple areas including the back and hip and leg.  He is also experiencing pain down into his right leg at nighttime apparently restless leg syndrome as it does get better with movement and repositioning.  He is on ropinirole 1 mg at night.  He is on oral iron supplementation but with some associated constipation.  He has had no significant change in dyspnea or level of peripheral swelling since the last visit.  He feels very cautious about physical activity as he is afraid to put additional strain on his heart and lungs.  Previous HPI 11/08/22 Keith [name] is a 72 y.o. male here for evaluation of positive ANA and rheumatoid factor in association with diastolic heart failure and pulmonary hypertension.  He was hospitalized last year due to exacerbation of diastolic heart failure apparently primarily right-sided failure related to recurrent pulmonary embolus.  He has a previous history of large PE in 2021 and had been on Eliquis anticoagulation until 2022.  He did not require embolectomy and is now back on indefinite anticoagulation.  He follows up with heart failure clinic for medical management including riociguat for his pulmonary arterial hypertension.  He has had large improvement in his edema since taking the  torsemide regularly.  He is also been successful with intentional weight loss since last year with reduction of added sugars and salts from his diet.  He had recent hospitalization again earlier this year due to pneumonia and finished a course of antibiotics for this within the past 2 weeks. He has chronic arthritis affecting multiple sites mostly throughout the lower body.  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  However his more recent problem started in 2014 with significant degenerative disease and spondylolisthesis and required surgery for L4-L5 which had very good improvement.  He fell in 2019 reinjuring his low back which required surgical revision.  This had led to some additional complication requiring debridement of entrapping fibrosis around nerves of the lumbar spine this led to severe debility with almost complete loss of function in both legs and had to do physical therapy for months before he get back to walking with a walker.  He also has known considerable right knee osteoarthritis.  He experiences diabetic peripheral neuropathy affecting both feet.  He takes ropinirole for pretty severe restless leg symptoms. He does not have a known history of autoimmune disease but does have family history of rheumatoid arthritis on his mother side.    Labs reviewed 07/2022 ANA pos dsDNA, RNP, Sm, SSA, SSB, centromere neg  RF 38.5   Review of Systems  Constitutional:  Negative for fatigue.  HENT:  Negative for mouth sores and mouth dryness.   Eyes:  Negative for dryness.  Respiratory:  Positive for shortness of breath.   Cardiovascular:  Negative for chest pain and palpitations.  Gastrointestinal:  Positive for blood in stool. Negative for constipation and diarrhea.  Endocrine: Negative for increased urination.   Genitourinary:  Negative for involuntary urination.  Musculoskeletal:  Positive for gait problem, muscle weakness, morning stiffness and muscle tenderness. Negative for joint pain, joint pain, joint swelling, myalgias and myalgias.  Skin:  Negative for color change, rash, hair loss and sensitivity to sunlight.  Allergic/Immunologic: Negative for susceptible to infections.  Neurological:  Negative for dizziness and headaches.  Hematological:  Negative for swollen glands.  Psychiatric/Behavioral:  Positive for sleep disturbance. Negative for depressed mood. The patient is not nervous/anxious.     PMFS History:  Patient Active Problem List   Diagnosis Date Noted   Acute respiratory failure with hypoxia ([place]) 10/23/2022   Rheumatoid factor positive 10/23/2022   Chronic pulmonary hypertension ([place]) 10/23/2022   Chest pain 03/01/2022   Elevated liver enzymes 03/01/2022   Myofascial pain 10/30/2021   Controlled type 2 diabetes mellitus with hyperglycemia, without long-term current use of insulin (HCC)    Bilateral leg weakness 08/08/2021   CAP (community acquired pneumonia) 11/19/2020   Loculated pleural effusion 11/19/2020   DNR (do not resuscitate) 11/19/2020   Closed right ankle fracture    Labile blood glucose    Hypokalemia    Drug induced constipation    Debility    Hyponatremia    Diabetic peripheral neuropathy ([place])    Acute blood loss anemia    Chronic pain syndrome    Postoperative pain    Closed right trimalleolar fracture 07/02/2020   Chronic respiratory failure with hypoxia (HCC)    Lumbar spinal stenosis 06/01/2020   Spinal cord compression ([place]) 04/02/2020   Thoracic myelopathy 07/25/2018   Hyperlipidemia associated with type 2 diabetes mellitus ([place]) 11/14/2016   Type 2 diabetes mellitus with diabetic neuropathy, unspecified ([place]) 11/14/2016   Routine general medical examination at a health care facility 11/14/2015   Hiatal hernia 08/30/2014   CKD (chronic kidney  disease), stage III ([place]) 04/10/2013   Allergic rhinitis 04/10/2013   Spinal stenosis of lumbar region 11/06/2012   Allergic rhinitis due to pollen 01/15/2011   Osteoarthritis 11/02/2008   HIP PAIN, LEFT 02/09/2008   Morbid obesity ([place]) BMI 41 11/07/2007   ERECTILE DYSFUNCTION, MILD 11/07/2007   Essential hypertension 11/07/2007    Past Medical History:  Diagnosis Date   Allergy    Chronic back pain    Diabetes mellitus without complication ([place])    DNR (do not resuscitate) 11/19/2020   Duodenal ulcer hemorrhage 08/29/2014   ED (erectile dysfunction)    Esophageal stricture 08/30/2014   Glucose intolerance (impaired glucose tolerance)    Hiatal hernia 08/30/2014   Hypertension    Hypokalemia 04/11/2013   MRSA carrier 08/30/2014   Obesity    Osteoarthritis    Pneumonia    Pulmonary embolism ([place]) 06/2020   Scoliosis 2016   Spinal stenosis of lumbar region    Urinary tract infection 04/11/2013    Family History  Problem Relation Age of Onset   Diabetes Mother    Asthma Mother    Breast cancer Mother    Hypertension Father    Stroke Father    Colon cancer Neg Hx    Past  Surgical History:  Procedure Laterality Date   BACK SURGERY  08/07/2021   COLONOSCOPY  2008   ESOPHAGOGASTRODUODENOSCOPY N/A 08/29/2014   Procedure: ESOPHAGOGASTRODUODENOSCOPY (EGD);  Surgeon: Lafayette Dragon, MD;  Location: [facility];  Service: Endoscopy;  Laterality: N/A;   LAMINECTOMY N/A 07/28/2018   Procedure: THORACIC ELEVEN- THORACIC TWELVE POSTERIOR DECOMPRESSION LAMINECTOMY;  Surgeon: Judith Part, MD;  Location: [facility];  Service: Neurosurgery;  Laterality: N/A;   LUMBAR LAMINECTOMY     NASAL POLYP SURGERY     ORIF ANKLE FRACTURE Right 07/01/2020   Procedure: OPEN REDUCTION INTERNAL FIXATION (ORIF) ANKLE FRACTURE. TRIMALLEOLAR;  Surgeon: Marchia Bond, MD;  Location: [facility];  Service: Orthopedics;  Laterality: Right;   RIGHT HEART CATH N/A 07/06/2022   Procedure: RIGHT HEART CATH;   Surgeon: Larey Dresser, MD;  Location: [facility];  Service: Cardiovascular;  Laterality: N/A;   RIGHT/LEFT HEART CATH AND CORONARY ANGIOGRAPHY N/A 03/02/2022   Procedure: RIGHT/LEFT HEART CATH AND CORONARY ANGIOGRAPHY;  Surgeon: Dixie Dials, MD;  Location: [facility];  Service: Cardiovascular;  Laterality: N/A;   TONSILLECTOMY     Social History   Social History Narrative   Married.     Immunization History  Administered Date(s) Administered   PFIZER(Purple Top)SARS-COV-2 Vaccination 12/20/2019, 01/09/2020, 09/24/2020   Pneumococcal Conjugate-13 11/11/2017, 11/24/2020   Pneumococcal Polysaccharide-23 11/09/2014, 03/30/2020   Td 10/09/2003   Tdap 11/09/2013     Objective: Vital Signs: BP 114/72 (BP Location: Left Arm, Patient Position: Sitting, Cuff Size: Normal)   Pulse 90   Resp 16   Ht 6' (1.829 m) Comment: patient in a wheelchair  Wt 273 lb (123.8 kg) Comment: patient in a wheelchair  BMI 37.03 kg/m    Physical Exam Constitutional:      Appearance: He is obese.     Comments: In wheelchair  Cardiovascular:     Rate and Rhythm: Normal rate and regular rhythm.  Pulmonary:     Effort: Pulmonary effort is normal.     Breath sounds: Wheezing present.  Musculoskeletal:     Right lower leg: Edema present.     Left lower leg: Edema present.  Lymphadenopathy:     Cervical: No cervical adenopathy.  Neurological:     Mental Status: He is alert.  Psychiatric:        Mood and Affect: Mood normal.      Musculoskeletal Exam:  Shoulders full ROM no tenderness or swelling Elbows full ROM no tenderness or swelling Wrists full ROM no tenderness or swelling, right wrist ulnar side soft tissue swelling Fingers full ROM no tenderness or swelling, mild 1st CMC joint squaring bilaterally Knees full ROM no tenderness or swelling    Investigation: No additional findings.  Imaging: No results found.  Recent Labs: Lab Results  Component Value Date   WBC  7.6 11/02/2022   HGB 11.0 (L) 11/02/2022   PLT 412.0 (H) 11/02/2022   NA 139 11/02/2022   K 3.8 11/02/2022   CL 103 11/02/2022   CO2 27 11/02/2022   GLUCOSE 139 (H) 11/02/2022   BUN 15 11/02/2022   CREATININE 1.03 11/02/2022   BILITOT 0.4 10/23/2022   ALKPHOS 72 10/23/2022   AST 29 10/23/2022   ALT 22 10/23/2022   PROT 8.3 (H) 10/23/2022   ALBUMIN 4.1 10/23/2022   CALCIUM 10.4 11/02/2022   GFRAA >60 07/11/2020    Speciality Comments: No specialty comments available.  Procedures:  No procedures performed Allergies: Aspirin and Lisinopril   Assessment /  Plan:     Visit Diagnoses: Chronic pulmonary hypertension ([place]) Rheumatoid factor positive  Mildly abnormal persistent laboratory findings but I cannot confirm any other evidence of systemic sclerosis or active inflammatory disease on exam.  Possible treatment options mycophenolate or glucocorticoids, steroids would have increased risk with his volume status and diabetes.  Discussed possibility for just monitoring this versus trial of immunomodulatory medication.  If so will be difficult to assess response to treatment we could follow sedimentation rate but this is less specific if there is a history of chronic thrombosis.  Will check with cardiology team for degree of suspicion and when they would be repeating any functional study that could gauge response to treatment.  Drug induced constipation  Would suspect 325 mg oral iron supplement as most likely contributory.  Not having any major complications from this.  Chronic pain syndrome  Agree with need for following up with pain management on long-term opioids and neuropathic pain medications including gabapentin and duloxetine.  Orders: No orders of the defined types were placed in this encounter.  No orders of the defined types were placed in this encounter.    Follow-Up Instructions: Return in about 2 months (around 02/04/2023) for ?APAH GC trial f/u  72mo.  Approximately 35 minutes were spent today for this follow-up visit including medical records review discussion of results significance and possible treatment options with patient and his wife, correspondence with other consulting physicians, and documentation.  CCollier Salina MD  Note - This record has been created using [employer]  Chart creation errors have been sought, but may not always  have been located. Such creation errors do not reflect on  the standard of medical care.

## 2022-12-06 ENCOUNTER — Ambulatory Visit: Payer: Medicare Other | Attending: Internal Medicine | Admitting: Internal Medicine

## 2022-12-06 ENCOUNTER — Encounter: Payer: Self-pay | Admitting: Internal Medicine

## 2022-12-06 VITALS — BP 114/72 | HR 90 | Resp 16 | Ht 72.0 in | Wt 273.0 lb

## 2022-12-06 DIAGNOSIS — I272 Pulmonary hypertension, unspecified: Secondary | ICD-10-CM

## 2022-12-06 DIAGNOSIS — G894 Chronic pain syndrome: Secondary | ICD-10-CM | POA: Diagnosis not present

## 2022-12-06 DIAGNOSIS — K5903 Drug induced constipation: Secondary | ICD-10-CM

## 2022-12-06 DIAGNOSIS — R768 Other specified abnormal immunological findings in serum: Secondary | ICD-10-CM | POA: Diagnosis not present

## 2022-12-07 DIAGNOSIS — G4733 Obstructive sleep apnea (adult) (pediatric): Secondary | ICD-10-CM | POA: Diagnosis not present

## 2022-12-12 NOTE — Progress Notes (Unsigned)
HPI: Keith [name] is a 72 y.o. male, who is here today with his wife for chronic disease management.  Last seen on 11/02/22 for hospital follow up. Since his last visit he has seen his rheumatologist.  Hypertension:  Medications:*** BP readings at home:*** Side effects:***  Negative for unusual or severe headache, visual changes, exertional chest pain, dyspnea,  focal weakness, or edema.  Lab Results  Component Value Date   CREATININE 1.03 11/02/2022   BUN 15 11/02/2022   NA 139 11/02/2022   K 3.8 11/02/2022   CL 103 11/02/2022   CO2 27 11/02/2022   Lab Results  Component Value Date   HGBA1C 6.1 11/02/2022    *** Review of Systems See other pertinent positives and negatives in HPI.  Current Outpatient Medications on File Prior to Visit  Medication Sig Dispense Refill   acetaminophen (TYLENOL) 325 MG tablet Take 1-2 tablets (325-650 mg total) by mouth every 4 (four) hours as needed for mild pain.     ADEMPAS 2 MG TABS Take by mouth.     albuterol (VENTOLIN HFA) 108 (90 Base) MCG/ACT inhaler Inhale 2 puffs into the lungs every 6 (six) hours as needed for wheezing or shortness of breath. 8 g 1   amLODipine (NORVASC) 10 MG tablet Take 1 tablet (10 mg total) by mouth daily. 90 tablet 1   apixaban (ELIQUIS) 5 MG TABS tablet Take 1 tablet (5 mg total) by mouth 2 (two) times daily. 60 tablet 11   Blood Glucose Monitoring Suppl (ACCU-CHEK GUIDE ME) w/Device KIT USE TO TEST BLOOD SUGARS 1-2 TIMES DAILY. 1 kit 0   carvedilol (COREG) 12.5 MG tablet Take 1 tablet (12.5 mg total) by mouth 2 (two) times daily with a meal. 180 tablet 3   cyanocobalamin (VITAMIN B12) 1000 MCG tablet Take 1,000 mcg by mouth daily.     DULoxetine (CYMBALTA) 60 MG capsule TAKE 1 CAPSULE BY MOUTH EVERY DAY 90 capsule 0   ferrous sulfate 325 (65 FE) MG EC tablet Take 325 mg by mouth every other day.     gabapentin (NEURONTIN) 400 MG capsule Take 400 mg by mouth 3 (three) times daily.      HYDROcodone-acetaminophen (NORCO) 10-325 MG tablet Take 1 tablet by mouth in the morning, at noon, and at bedtime.     HYDROcodone-acetaminophen (NORCO) 7.5-325 MG tablet take 1 (one) tablet four times daily as needed for pain 120 tablet 0   hydrocortisone (ANUSOL-HC) 2.5 % rectal cream Place 1 application. rectally as needed for hemorrhoids or anal itching.     levETIRAcetam (KEPPRA) 250 MG tablet Take 1 tablet (250 mg total) by mouth 2 (two) times daily. 180 tablet 0   lidocaine (LIDODERM) 5 % Place 1 patch onto the skin daily as needed (pain). Remove & Discard patch within 12 hours or as directed by MD     naloxone (NARCAN) nasal spray 4 mg/0.1 mL Place 1 spray into the nose as needed (accidental overdose).     polyethylene glycol (MIRALAX / GLYCOLAX) 17 g packet Take 17 g by mouth daily as needed for mild constipation.     Riociguat (ADEMPAS) 2 MG TABS Take 1 tablet by mouth. In the morning, at noon, and at bedtime.     rOPINIRole (REQUIP) 1 MG tablet Take 1 tablet (1 mg total) by mouth at bedtime. 90 tablet 2   rosuvastatin (CRESTOR) 20 MG tablet Take 1 tablet (20 mg total) by mouth daily. 90 tablet 3   spironolactone (ALDACTONE)  25 MG tablet Take 1 tablet (25 mg total) by mouth daily. 90 tablet 3   torsemide (DEMADEX) 20 MG tablet Take 1 tablet (20 mg total) by mouth daily. 90 tablet 3   traZODone (DESYREL) 50 MG tablet Take 0.5-1 tablets (25-50 mg total) by mouth at bedtime as needed. for sleep 90 tablet 0   No current facility-administered medications on file prior to visit.    Past Medical History:  Diagnosis Date   Allergy    Chronic back pain    Diabetes mellitus without complication ([place])    DNR (do not resuscitate) 11/19/2020   Duodenal ulcer hemorrhage 08/29/2014   ED (erectile dysfunction)    Esophageal stricture 08/30/2014   Glucose intolerance (impaired glucose tolerance)    Hiatal hernia 08/30/2014   Hypertension    Hypokalemia 04/11/2013   MRSA carrier 08/30/2014    Obesity    Osteoarthritis    Pneumonia    Pulmonary embolism ([place]) 06/2020   Scoliosis 2016   Spinal stenosis of lumbar region    Urinary tract infection 04/11/2013   Allergies  Allergen Reactions   Aspirin Other (See Comments)    Irritates the stomach and the patient developed ulcers, also   Lisinopril Other (See Comments)    Caused a body ache    Social History   Socioeconomic History   Marital status: Married    Spouse name: Not on file   Number of children: Not on file   Years of education: Not on file   Highest education level: Not on file  Occupational History   Occupation: retired  Tobacco Use   Smoking status: Former    Packs/day: 1.00    Years: 25.00    Total pack years: 25.00    Types: Cigarettes    Quit date: 1995    Years since quitting: 29.2    Passive exposure: Never   Smokeless tobacco: Never  Vaping Use   Vaping Use: Never used  Substance and Sexual Activity   Alcohol use: Yes    Comment: holidays and special occasions   Drug use: Not Currently   Sexual activity: Yes    Birth control/protection: None  Other Topics Concern   Not on file  Social History Narrative   Married.     Social Determinants of Health   Financial Resource Strain: Low Risk  (03/06/2022)   Overall Financial Resource Strain (CARDIA)    Difficulty of Paying Living Expenses: Not hard at all  Food Insecurity: No Food Insecurity (10/26/2022)   Hunger Vital Sign    Worried About Running Out of Food in the Last Year: Never true    Ran Out of Food in the Last Year: Never true  Transportation Needs: No Transportation Needs (10/26/2022)   PRAPARE - Hydrologist (Medical): No    Lack of Transportation (Non-Medical): No  Physical Activity: Sufficiently Active (01/09/2022)   Exercise Vital Sign    Days of Exercise per Week: 7 days    Minutes of Exercise per Session: 30 min  Stress: No Stress Concern Present (01/09/2022)   [place]    Feeling of Stress : Not at all  Social Connections: Moderately Integrated (01/03/2021)   Social Connection and Isolation Panel [NHANES]    Frequency of Communication with Friends and Family: Three times a week    Frequency of Social Gatherings with Friends and Family: Three times a week    Attends Religious Services:  More than 4 times per year    Active Member of Clubs or Organizations: No    Attends Archivist Meetings: Never    Marital Status: Married    Vitals:   12/14/22 1042  BP: 110/60  Pulse: 100  Resp: 16  SpO2: 95%   Body mass index is 37.03 kg/m.  Physical Exam Nursing note reviewed.  Constitutional:      General: He is not in acute distress.    Appearance: He is well-developed.  HENT:     Head: Normocephalic and atraumatic.  Eyes:     Conjunctiva/sclera: Conjunctivae normal.  Cardiovascular:     Rate and Rhythm: Normal rate. Rhythm irregular.     Heart sounds: No murmur heard.    Comments: PT pulses palpable. Pulmonary:     Effort: Pulmonary effort is normal. No respiratory distress.     Breath sounds: Normal breath sounds.  Abdominal:     Palpations: Abdomen is soft.     Tenderness: There is no abdominal tenderness.  Musculoskeletal:     Right lower leg: 1+ Pitting Edema present.     Left lower leg: 1+ Pitting Edema present.  Skin:    General: Skin is warm.     Findings: No erythema or rash.  Neurological:     Mental Status: He is alert and oriented to person, place, and time.     Cranial Nerves: No cranial nerve deficit.     Comments: He is in a wheel chair  Psychiatric:        Mood and Affect: Mood and affect normal.     ASSESSMENT AND PLAN:  [place] was seen today for medical management of chronic issues.  Diagnoses and all orders for this visit:  Essential hypertension  Allergic rhinitis, unspecified seasonality, unspecified trigger  Gastroesophageal reflux disease, unspecified  whether esophagitis present -     omeprazole (PRILOSEC) 40 MG capsule; Take 1 capsule (40 mg total) by mouth daily.    No orders of the defined types were placed in this encounter.   No problem-specific Assessment & Plan notes found for this encounter.   Return in about 6 months (around 06/16/2023).  Ranika Mcniel G. [name], MD  [facility]. [place] office.

## 2022-12-14 ENCOUNTER — Ambulatory Visit (INDEPENDENT_AMBULATORY_CARE_PROVIDER_SITE_OTHER): Payer: Medicare Other | Admitting: Family Medicine

## 2022-12-14 ENCOUNTER — Encounter: Payer: Self-pay | Admitting: Family Medicine

## 2022-12-14 VITALS — BP 110/60 | HR 100 | Resp 16 | Ht 72.0 in | Wt 270.0 lb

## 2022-12-14 DIAGNOSIS — J309 Allergic rhinitis, unspecified: Secondary | ICD-10-CM | POA: Diagnosis not present

## 2022-12-14 DIAGNOSIS — K219 Gastro-esophageal reflux disease without esophagitis: Secondary | ICD-10-CM | POA: Diagnosis not present

## 2022-12-14 DIAGNOSIS — I10 Essential (primary) hypertension: Secondary | ICD-10-CM | POA: Diagnosis not present

## 2022-12-14 MED ORDER — OMEPRAZOLE 40 MG PO CPDR: 40.0000 mg | DELAYED_RELEASE_CAPSULE | Freq: Every day | ORAL | 1 refills | Status: DC

## 2022-12-14 NOTE — Patient Instructions (Addendum)
A few things to remember from today's visit:  Essential hypertension  Allergic rhinitis, unspecified seasonality, unspecified trigger  Gastroesophageal reflux disease, unspecified whether esophagitis present - Plan: omeprazole (PRILOSEC) 40 MG capsule Today we are starting Omeprazole 40 mg to take at night. No changes in rest.  If you need refills for medications you take chronically, please call your pharmacy. Do not use My Chart to request refills or for acute issues that need immediate attention. If you send a my chart message, it may take a few days to be addressed, specially if I am not in the office.  Please be sure medication list is accurate. If a new problem present, please set up appointment sooner than planned today.

## 2022-12-15 NOTE — Assessment & Plan Note (Signed)
Omeprazole 40 mg daily 30 min before breakfast for 6-8 weeks recommended. GERD precautions. We can try decreasing dose of Omeprazole to 20 mg once symptoms have resolved.

## 2022-12-15 NOTE — Assessment & Plan Note (Signed)
BP adequately controlled. Continue carvedilol 12.5 mg twice daily,Spironolactone 25 mg daily, and Amlodipine 10 mg daily as well as low-salt diet. Continue monitoring BP at home.

## 2022-12-15 NOTE — Assessment & Plan Note (Signed)
Symptoms have improved. Continue Flonase nasal spray daily as needed. Nasal saline irrigations as needed.

## 2022-12-15 NOTE — Assessment & Plan Note (Addendum)
With associated OSA,GERD,DM II,OA  He understands the benefits of wt loss as well as adverse effects of obesity. Encouraged consistency with continuing a healthful diet and physical activity as tolerated.

## 2022-12-20 ENCOUNTER — Encounter: Payer: Self-pay | Admitting: Gastroenterology

## 2022-12-24 ENCOUNTER — Encounter (HOSPITAL_COMMUNITY): Payer: Self-pay | Admitting: Cardiology

## 2022-12-24 ENCOUNTER — Ambulatory Visit (HOSPITAL_COMMUNITY)
Admission: RE | Admit: 2022-12-24 | Discharge: 2022-12-24 | Disposition: A | Discharge status: Home / Self Care | Payer: Medicare Other | Source: Ambulatory Visit | Attending: Cardiology | Admitting: Cardiology

## 2022-12-24 VITALS — BP 108/60 | HR 96 | Wt 274.6 lb

## 2022-12-24 DIAGNOSIS — Z86718 Personal history of other venous thrombosis and embolism: Secondary | ICD-10-CM | POA: Diagnosis not present

## 2022-12-24 DIAGNOSIS — I272 Pulmonary hypertension, unspecified: Secondary | ICD-10-CM

## 2022-12-24 DIAGNOSIS — Z6837 Body mass index (BMI) 37.0-37.9, adult: Secondary | ICD-10-CM | POA: Insufficient documentation

## 2022-12-24 DIAGNOSIS — Z86018 Personal history of other benign neoplasm: Secondary | ICD-10-CM | POA: Insufficient documentation

## 2022-12-24 DIAGNOSIS — Z87891 Personal history of nicotine dependence: Secondary | ICD-10-CM | POA: Diagnosis not present

## 2022-12-24 DIAGNOSIS — G4733 Obstructive sleep apnea (adult) (pediatric): Secondary | ICD-10-CM | POA: Insufficient documentation

## 2022-12-24 DIAGNOSIS — I2721 Secondary pulmonary arterial hypertension: Secondary | ICD-10-CM | POA: Insufficient documentation

## 2022-12-24 DIAGNOSIS — Z87440 Personal history of urinary (tract) infections: Secondary | ICD-10-CM | POA: Diagnosis not present

## 2022-12-24 DIAGNOSIS — I130 Hypertensive heart and chronic kidney disease with heart failure and stage 1 through stage 4 chronic kidney disease, or unspecified chronic kidney disease: Secondary | ICD-10-CM | POA: Diagnosis not present

## 2022-12-24 DIAGNOSIS — I071 Rheumatic tricuspid insufficiency: Secondary | ICD-10-CM | POA: Insufficient documentation

## 2022-12-24 DIAGNOSIS — N183 Chronic kidney disease, stage 3 unspecified: Secondary | ICD-10-CM | POA: Insufficient documentation

## 2022-12-24 DIAGNOSIS — Z7901 Long term (current) use of anticoagulants: Secondary | ICD-10-CM | POA: Insufficient documentation

## 2022-12-24 DIAGNOSIS — E1122 Type 2 diabetes mellitus with diabetic chronic kidney disease: Secondary | ICD-10-CM | POA: Insufficient documentation

## 2022-12-24 DIAGNOSIS — E669 Obesity, unspecified: Secondary | ICD-10-CM | POA: Insufficient documentation

## 2022-12-24 DIAGNOSIS — I5032 Chronic diastolic (congestive) heart failure: Secondary | ICD-10-CM | POA: Diagnosis not present

## 2022-12-24 DIAGNOSIS — Z86711 Personal history of pulmonary embolism: Secondary | ICD-10-CM | POA: Diagnosis not present

## 2022-12-24 DIAGNOSIS — M4800 Spinal stenosis, site unspecified: Secondary | ICD-10-CM | POA: Insufficient documentation

## 2022-12-24 DIAGNOSIS — Z79899 Other long term (current) drug therapy: Secondary | ICD-10-CM | POA: Insufficient documentation

## 2022-12-24 LAB — HEMOGLOBIN A1C
Hgb A1c MFr Bld: 5.5 % (ref 4.8–5.6)
Mean Plasma Glucose: 111 mg/dL

## 2022-12-24 LAB — BASIC METABOLIC PANEL
Anion gap: 7 (ref 5–15)
BUN: 21 mg/dL (ref 8–23)
CO2: 22 mmol/L (ref 22–32)
Calcium: 9.6 mg/dL (ref 8.9–10.3)
Chloride: 110 mmol/L (ref 98–111)
Creatinine, Ser: 1.36 mg/dL — ABNORMAL HIGH (ref 0.61–1.24)
GFR, Estimated: 56 mL/min — ABNORMAL LOW (ref >60)
Glucose, Bld: 124 mg/dL — ABNORMAL HIGH (ref 70–99)
Potassium: 4.1 mmol/L (ref 3.5–5.1)
Sodium: 139 mmol/L (ref 135–145)

## 2022-12-24 LAB — BRAIN NATRIURETIC PEPTIDE: B Natriuretic Peptide: 16.9 pg/mL (ref 0.0–100.0)

## 2022-12-24 NOTE — Progress Notes (Signed)
ADVANCED HF CLINIC CONSULT NOTE  Primary Care: [name], [name], MD HF Cardiologist: Dr. Aundra Dubin   HPI: Keith [name] is a 72 y.o.male with history saddle PE/LLE DVT [id number] s/p TPA complicated by hypotension requiring Levophed, multiple prior back surgeries, left adrenal adenoma, DM II, obesity, HTN.   Admitted 03/01/22 with CP. He was hypoxic requiring supplemental oxygen. V/Q scan with moderate to high probability of PE.  Lifelong DOAC recommended, started on Eliquis 5 BID (had been off eliquis since 02/22).   Echo in 5/23 showed EF 55-60%, mild LVH, interventricular septum flattened in systole and diastole consistent with RV pressure and volume overload, RV severely enlarged with severely reduced function, RVSP 75 mmH, moderate to severe TR.   RHC/LHC in 5/23 showed no CAD, elevated right and left heart filling pressures, and pulmonary venous hypertension.  He was seen by CT surgery. Felt to be high risk for pulmonary embolectomy. Course complicated by AKI, Scr peaked at 3.34 and improved to 1.60 (baseline). Started on torsemide 20 mg M, W, F at discharge. Atenolol/HTCZ stopped.   Echo in 9/23 showed EF 55-60%, mildly decreased RV function with mild-moderate RV enlargement, unable to estimate PA systolic pressure, IVC normal. RHC was done in 10/23, showing moderate pulmonary arterial hypertension with PVR 5 WU.    Patient returns for followup of RV failure.  He walks very little due to spinal stenosis, not CHF.  He is primarily in the wheelchair. He walks short distances with his walker at home.  He is now using CPAP.  Weight is stable.  Still has not had V/Q scan, says it was cancelled?  Poor stamina.  Gets short of breath walking longer distances around the house but has not been doing much walking.  He is on Adempas at 2 mg tid, has had some nausea with Adempas so increasing slowly.   Labs (6/23): K 4.0, creatinine 1.28 Labs (7/23): K 4.1, creatinine 1.69, BNP 20 Labs (9/23): K 4.2,  creatinine 1.42, BNP 33 Labs (10/23): RF elevated, ANA elevated, K 4.6, creatinine 1.44, BNP 16 Labs (11/23): creatinine 1.5 Labs (1/24): K 3.8, creatinine 1.03  ECG (personally reviewed): NSR 1st degree AVB, LAFB, poor RWP  PMH: 1. Type 2 diabetes 2. Left adrenal adenoma 3. Obesity 4. HTN 5. H/o back surgery for spinal stenosis: Has had 3 surgeries, actually could not walk after the third surgery but has been doing PT.  6. Venous thromboembolism: Saddle PE in 9/21 with LLE DVT, this was treated with TPA.   - Recurrent PE in 5/23 after stopping DOAC.  - Echo (5/23): EF 55-60%, mild LVH, interventricular septum flattened in systole and diastole consistent with RV pressure and volume overload, RV severely enlarged with severely reduced function, RVSP 75 mmH, moderate to severe TR.  - R/LHC (5/23): RA 12, PA 48/34 (39), PCWP mean 40, V wave 42, Fick CO/CI 5.93/2.33, PVR ?< 1 WU.  - Echo (9/23): EF 55-60%, mildly decreased RV function with mild-moderate RV enlargement, unable to estimate PA systolic pressure, IVC normal.  - RHC (10/23): mean RA 9, PA 60/30 mean 36, mean PCWP 6, CI 2.46, PVR 5 WU - CTA chest (1/24): No PE - ANA/anti-cardiolipid/beta-2 glycoprotein/CCP negative, ESR 50, RF 42 (2/24) 7. CKD stage 3 8. OSA: Uses CPAP.   Review of Systems: All systems reviewed and negative except as per HPI.    Current Outpatient Medications  Medication Sig Dispense Refill   acetaminophen (TYLENOL) 325 MG tablet Take 1-2 tablets (325-650 mg total)  by mouth every 4 (four) hours as needed for mild pain.     albuterol (VENTOLIN HFA) 108 (90 Base) MCG/ACT inhaler Inhale 2 puffs into the lungs every 6 (six) hours as needed for wheezing or shortness of breath. 8 g 1   amLODipine (NORVASC) 10 MG tablet Take 1 tablet (10 mg total) by mouth daily. 90 tablet 1   apixaban (ELIQUIS) 5 MG TABS tablet Take 1 tablet (5 mg total) by mouth 2 (two) times daily. 60 tablet 11   Blood Glucose Monitoring Suppl  (ACCU-CHEK GUIDE ME) w/Device KIT USE TO TEST BLOOD SUGARS 1-2 TIMES DAILY. 1 kit 0   carvedilol (COREG) 12.5 MG tablet Take 1 tablet (12.5 mg total) by mouth 2 (two) times daily with a meal. 180 tablet 3   cyanocobalamin (VITAMIN B12) 1000 MCG tablet Take 1,000 mcg by mouth daily.     DULoxetine (CYMBALTA) 60 MG capsule TAKE 1 CAPSULE BY MOUTH EVERY DAY 90 capsule 0   ferrous sulfate 325 (65 FE) MG EC tablet Take 325 mg by mouth every other day.     gabapentin (NEURONTIN) 400 MG capsule Take 400 mg by mouth 3 (three) times daily.     HYDROcodone-acetaminophen (NORCO) 7.5-325 MG tablet Take 1 tablet by mouth in the morning, at noon, in the evening, and at bedtime. 7.5 mg/375 mg     hydrocortisone (ANUSOL-HC) 2.5 % rectal cream Place 1 application. rectally as needed for hemorrhoids or anal itching.     levETIRAcetam (KEPPRA) 250 MG tablet Take 1 tablet (250 mg total) by mouth 2 (two) times daily. 180 tablet 0   lidocaine (LIDODERM) 5 % Place 1 patch onto the skin daily as needed (pain). Remove & Discard patch within 12 hours or as directed by MD     naloxone (NARCAN) nasal spray 4 mg/0.1 mL Place 1 spray into the nose as needed (accidental overdose).     omeprazole (PRILOSEC) 40 MG capsule Take 1 capsule (40 mg total) by mouth daily. 90 capsule 1   polyethylene glycol (MIRALAX / GLYCOLAX) 17 g packet Take 17 g by mouth daily as needed for mild constipation.     Riociguat (ADEMPAS) 2 MG TABS Take 1 tablet by mouth. In the morning, at noon, and at bedtime.     rOPINIRole (REQUIP) 1 MG tablet Take 1 tablet (1 mg total) by mouth at bedtime. 90 tablet 2   rosuvastatin (CRESTOR) 20 MG tablet Take 1 tablet (20 mg total) by mouth daily. 90 tablet 3   spironolactone (ALDACTONE) 25 MG tablet Take 1 tablet (25 mg total) by mouth daily. 90 tablet 3   torsemide (DEMADEX) 20 MG tablet Take 1 tablet (20 mg total) by mouth daily. 90 tablet 3   traZODone (DESYREL) 50 MG tablet Take 0.5-1 tablets (25-50 mg total) by  mouth at bedtime as needed. for sleep 90 tablet 0   No current facility-administered medications for this encounter.   Allergies  Allergen Reactions   Aspirin Other (See Comments)    Irritates the stomach and the patient developed ulcers, also   Lisinopril Other (See Comments)    Caused a body ache   Social History   Socioeconomic History   Marital status: Married    Spouse name: Not on file   Number of children: Not on file   Years of education: Not on file   Highest education level: Not on file  Occupational History   Occupation: retired  Tobacco Use   Smoking status: Former  Packs/day: 1.00    Years: 25.00    Additional pack years: 0.00    Total pack years: 25.00    Types: Cigarettes    Quit date: 70    Years since quitting: 29.2    Passive exposure: Never   Smokeless tobacco: Never  Vaping Use   Vaping Use: Never used  Substance and Sexual Activity   Alcohol use: Yes    Comment: holidays and special occasions   Drug use: Not Currently   Sexual activity: Yes    Birth control/protection: None  Other Topics Concern   Not on file  Social History Narrative   Married.     Social Determinants of Health   Financial Resource Strain: Low Risk  (03/06/2022)   Overall Financial Resource Strain (CARDIA)    Difficulty of Paying Living Expenses: Not hard at all  Food Insecurity: No Food Insecurity (10/26/2022)   Hunger Vital Sign    Worried About Running Out of Food in the Last Year: Never true    Ran Out of Food in the Last Year: Never true  Transportation Needs: No Transportation Needs (10/26/2022)   PRAPARE - Hydrologist (Medical): No    Lack of Transportation (Non-Medical): No  Physical Activity: Sufficiently Active (01/09/2022)   Exercise Vital Sign    Days of Exercise per Week: 7 days    Minutes of Exercise per Session: 30 min  Stress: No Stress Concern Present (01/09/2022)   [place]    Feeling of Stress : Not at all  Social Connections: Moderately Integrated (01/03/2021)   Social Connection and Isolation Panel [NHANES]    Frequency of Communication with Friends and Family: Three times a week    Frequency of Social Gatherings with Friends and Family: Three times a week    Attends Religious Services: More than 4 times per year    Active Member of Clubs or Organizations: No    Attends Archivist Meetings: Never    Marital Status: Married  Human resources officer Violence: Not At Risk (10/23/2022)   Humiliation, Afraid, Rape, and Kick questionnaire    Fear of Current or Ex-Partner: No    Emotionally Abused: No    Physically Abused: No    Sexually Abused: No   Family History  Problem Relation Age of Onset   Diabetes Mother    Asthma Mother    Breast cancer Mother    Hypertension Father    Stroke Father    Colon cancer Neg Hx    BP 108/60   Pulse 96   Wt 124.6 kg (274 lb 9.6 oz)   SpO2 97%   BMI 37.24 kg/m   Wt Readings from Last 3 Encounters:  12/24/22 124.6 kg (274 lb 9.6 oz)  12/14/22 122.5 kg (270 lb)  12/06/22 123.8 kg (273 lb)   PHYSICAL EXAM: General: NAD Neck: No JVD, no thyromegaly or thyroid nodule.  Lungs: Clear to auscultation bilaterally with normal respiratory effort. CV: Nondisplaced PMI.  Heart regular S1/S2, no S3/S4, no murmur.  No peripheral edema.  No carotid bruit.  Normal pedal pulses.  Abdomen: Soft, nontender, no hepatosplenomegaly, no distention.  Skin: Intact without lesions or rashes.  Neurologic: Alert and oriented x 3.  Psych: Normal affect. Extremities: No clubbing or cyanosis.  HEENT: Normal.   ASSESSMENT & PLAN: 1.  Chronic diastolic CHF with RV failure:  Suspect RV strain from prior PEs.  Echo in 5/23 showed  EF 55-60%, RV severely reduced, RVSP 75 mmHg, moderate to severe TR.  Echo in 9/23 showed EF 55-60%, mildly decreased RV function with mild-moderate RV enlargement, unable to estimate PA  systolic pressure, IVC normal.  RHC in 10/23 showed moderate pulmonary arterial hypertension.  He does not looks volume overloaded today. Functional status is complicated by poor mobility due to spinal stenosis.  - Continue torsemide 20 mg daily, BMET/BNP today.   - Continue spironolactone 25 mg daily.  - Hold off on SGLT2i with recurrent UTIs.  2. Pulmonary hypertension: RHC in 10/23 showed moderate pulmonary arterial hypertension.  Based on history, this seems most likely to be chronic thromboembolic pulmonary hypertension.  He was found to have elevations in RF and ESR but negative CCP antibody.  Workup by rheumatology did not show signs of active inflammatory disease on exam.  - Continue Eliquis for h/o PEs.  - Still need to get V/Q scan to assess for chronic PEs, will order again today. If V/Q scan is concerning for chronic thromboembolic disease, will need to consider referral to [facility] for balloon pulmonary angioplasty (would not be good candidate for pulmonary thromboendarterectomy). CTA chest done in 1/24 did not show evidence for PE.  - Continue CPAP for OSA.  - With suspected CTEPH, we are titrating up Adempas.  He is on 2 mg tid and will increase soon to 2.5 mg tid.   3. Tricuspid regurgitation: Moderate to severe on echo 05/23. Minimal on echo in 9/23.  4. History PE/DVT: PE/DVT 09/21 s/p TPA, required pressor support with NE. Recurrent PE in 5/23 off Eliquis. Now concern for chronic thromboembolic disease as above.  - Will need lifelong anticoagulation, on Eliquis 5 bid. No bleeding issues. - Refer to pulmonary rehab.  5. CKD stage 3: BMET today.  6. HTN: BP controlled.  7. Spinal stenosis: Limited mobility at baseline.  8. OSA: Continue CPAP.  9. Obesity: Will check HgbA1c, will refer to pharmacy clinic for semaglutide or Mounjaro if elevated.   Followup in 3-4 months.   Loralie Champagne 12/24/2022

## 2022-12-24 NOTE — Patient Instructions (Signed)
There has been no changes to your medications.  Labs done today, your results will be available in MyChart, we will contact you for abnormal readings.  You have been referred to Pulmonary Rehab. You will be called to have your appointments arranged.  You have been referred to the [facility]. They will call you to arrange your appointment.  Your physician recommends that you schedule a follow-up appointment in: 4 months (July ) ** please call the office in May to arrange your follow up appointment. **  If you have any questions or concerns before your next appointment please send [place] a message through [place] or call our office at [phone].    TO LEAVE A MESSAGE FOR THE NURSE SELECT OPTION 2, PLEASE LEAVE A MESSAGE INCLUDING: YOUR NAME DATE OF BIRTH CALL BACK NUMBER REASON FOR CALL**this is important as we prioritize the call backs  YOU WILL RECEIVE A CALL BACK THE SAME DAY AS LONG AS YOU CALL BEFORE 4:00 PM  At the [facility], you and your health needs are our priority. As part of our continuing mission to provide you with exceptional heart care, we have created designated Provider Care Teams. These Care Teams include your primary Cardiologist (physician) and Advanced Practice Providers (APPs- Physician Assistants and Nurse Practitioners) who all work together to provide you with the care you need, when you need it.   You may see any of the following providers on your designated Care Team at your next follow up: Dr Glori Bickers Dr Loralie Champagne Dr. Roxana Hires, NP Lyda Jester, [place] [facility], [place] Forestine Na, NP Audry Riles, PharmD   Please be sure to bring in all your medications bottles to every appointment.    Thank you for choosing [facility]

## 2022-12-25 ENCOUNTER — Telehealth: Payer: Self-pay | Admitting: Pharmacist

## 2022-12-25 NOTE — Telephone Encounter (Signed)
-----   Message from Jerl Mina, RN sent at 12/24/2022  9:25 AM EDT ----- Semaglutide or Darcel Bayley if he qualifies for either. A1C drawn today

## 2022-12-25 NOTE — Telephone Encounter (Signed)
A1c yesterday is normal at 5.5%. He does have prior A1cs in the diabetic range though, 6.6% on 01/17/22 and 6.7% on 06/27/20 and he has type 2 diabetes listed in his chart. Previously took metformin and insulin. Should qualify for Mounjaro or Ozempic based on DM dx He has Medicare so insurance won't cover a GLP approved for weight loss. He does already take Eliquis, GLP would have similar copay until he gets in the donut hole.  Called pt to discuss. He is very interested in trying a GLP. Made extreme diet changes since his CHF diagnosis and really cut back on salt and sugar intake as well as processed foods. This has normalized his A1c which is excellent. Exercise is still challenging for him due to SOB. Will submit PA for [facility] and call pt back once determination is made. Key: [id number]

## 2022-12-26 DIAGNOSIS — G8929 Other chronic pain: Secondary | ICD-10-CM | POA: Diagnosis not present

## 2022-12-26 DIAGNOSIS — M549 Dorsalgia, unspecified: Secondary | ICD-10-CM | POA: Diagnosis not present

## 2022-12-26 DIAGNOSIS — M25551 Pain in right hip: Secondary | ICD-10-CM | POA: Diagnosis not present

## 2022-12-26 DIAGNOSIS — M25571 Pain in right ankle and joints of right foot: Secondary | ICD-10-CM | POA: Diagnosis not present

## 2022-12-26 DIAGNOSIS — Z9181 History of falling: Secondary | ICD-10-CM | POA: Diagnosis not present

## 2022-12-26 DIAGNOSIS — Z79899 Other long term (current) drug therapy: Secondary | ICD-10-CM | POA: Diagnosis not present

## 2022-12-26 DIAGNOSIS — I10 Essential (primary) hypertension: Secondary | ICD-10-CM | POA: Diagnosis not present

## 2022-12-26 MED ORDER — MOUNJARO 2.5 MG/0.5ML ~~LOC~~ SOAJ: 2.5000 mg | SUBCUTANEOUS | 0 refills | Status: DC

## 2022-12-26 NOTE — Telephone Encounter (Signed)
Mounjaro PA approved through 10/08/23. Rx sent to pharmacy, appt scheduled with PharmD for next week for injection training and education.

## 2022-12-28 DIAGNOSIS — Z79899 Other long term (current) drug therapy: Secondary | ICD-10-CM | POA: Diagnosis not present

## 2023-01-01 ENCOUNTER — Encounter (HOSPITAL_COMMUNITY): Payer: Self-pay

## 2023-01-01 NOTE — Progress Notes (Unsigned)
Patient ID: CRIT SCHOR                 DOB: 1950/12/22                    MRN: [id number]     HPI: Keith [name] is a 72 y.o. male patient referred to pharmacy clinic by Dr Aundra Dubin to initiate weight loss therapy with GLP1-RA. PMH is significant for saddle PE/DVT 06/2020, recurrent PE 02/2022 now on lifelong anticoag, DM2, HTN, diastolic CHF, pulmonary HTN, obesity, CKD, OSA on CPAP, and multiple prior back surgeries. Most recent BMI 37.  Pt with prior A1c elevation to 6.7% 06/2020, most recently normalized to 5.5% on 12/24/22 with lifestyle changes. Previously took metformin, stopped due to fluctuating glucose readings. 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 In a wheelchair today, using a walker at home. Had complications with surgery and has to re-learn how to walk, starting PT soon. He has made excellent diet changes since his CHF diagnosis and really cut back on salt and sugar intake as well as processed foods. This has normalized his A1c which is excellent. Exercise is still challenging for him due to SOB, starting pulmonary rehab as well.  His Mounjaro PA was approved through 10/08/23. He brings in his med today, did not have a copay on it (Eliquis is free as well).  Diet: 1-2x a day, light breakfast - oatmeal or fruit or cereal. Then eats again 4-6pm. Cut salt out from his diet.   Family History: Mother with DM, father with HTN and stroke.  Social History: Former smoker 1 PPD x 25 years, quit in 1995. Occasional alcohol use.  Labs: Lab Results  Component Value Date   HGBA1C 5.5 12/24/2022    Wt Readings from Last 1 Encounters:  12/24/22 274 lb 9.6 oz (124.6 kg)    BP Readings from Last 1 Encounters:  12/24/22 108/60   Pulse Readings from Last 1 Encounters:  12/24/22 96       Component Value Date/Time   CHOL 140 01/17/2022 1140   TRIG 81.0 01/17/2022 1140   TRIG 66 09/09/2006 0956   HDL 50.80 01/17/2022 1140   CHOLHDL 3 01/17/2022  1140   VLDL 16.2 01/17/2022 1140   LDLCALC 73 01/17/2022 1140   LDLCALC 82 08/01/2020 1059    Past Medical History:  Diagnosis Date   Allergy    Chronic back pain    Diabetes mellitus without complication ([place])    DNR (do not resuscitate) 11/19/2020   Duodenal ulcer hemorrhage 08/29/2014   ED (erectile dysfunction)    Esophageal stricture 08/30/2014   Glucose intolerance (impaired glucose tolerance)    Hiatal hernia 08/30/2014   Hypertension    Hypokalemia 04/11/2013   MRSA carrier 08/30/2014   Obesity    Osteoarthritis    Pneumonia    Pulmonary embolism ([place]) 06/2020   Scoliosis 2016   Spinal stenosis of lumbar region    Urinary tract infection 04/11/2013    Current Outpatient Medications on File Prior to Visit  Medication Sig Dispense Refill   acetaminophen (TYLENOL) 325 MG tablet Take 1-2 tablets (325-650 mg total) by mouth every 4 (four) hours as needed for mild pain.     albuterol (VENTOLIN HFA) 108 (90 Base) MCG/ACT inhaler Inhale 2 puffs into the lungs every 6 (six) hours as needed for wheezing or shortness of breath. 8 g 1   amLODipine (NORVASC) 10 MG tablet Take 1 tablet (10  mg total) by mouth daily. 90 tablet 1   apixaban (ELIQUIS) 5 MG TABS tablet Take 1 tablet (5 mg total) by mouth 2 (two) times daily. 60 tablet 11   Blood Glucose Monitoring Suppl (ACCU-CHEK GUIDE ME) w/Device KIT USE TO TEST BLOOD SUGARS 1-2 TIMES DAILY. 1 kit 0   carvedilol (COREG) 12.5 MG tablet Take 1 tablet (12.5 mg total) by mouth 2 (two) times daily with a meal. 180 tablet 3   cyanocobalamin (VITAMIN B12) 1000 MCG tablet Take 1,000 mcg by mouth daily.     DULoxetine (CYMBALTA) 60 MG capsule TAKE 1 CAPSULE BY MOUTH EVERY DAY 90 capsule 0   ferrous sulfate 325 (65 FE) MG EC tablet Take 325 mg by mouth every other day.     gabapentin (NEURONTIN) 400 MG capsule Take 400 mg by mouth 3 (three) times daily.     HYDROcodone-acetaminophen (NORCO) 7.5-325 MG tablet Take 1 tablet by mouth in the  morning, at noon, in the evening, and at bedtime. 7.5 mg/375 mg     hydrocortisone (ANUSOL-HC) 2.5 % rectal cream Place 1 application. rectally as needed for hemorrhoids or anal itching.     levETIRAcetam (KEPPRA) 250 MG tablet Take 1 tablet (250 mg total) by mouth 2 (two) times daily. 180 tablet 0   lidocaine (LIDODERM) 5 % Place 1 patch onto the skin daily as needed (pain). Remove & Discard patch within 12 hours or as directed by MD     naloxone (NARCAN) nasal spray 4 mg/0.1 mL Place 1 spray into the nose as needed (accidental overdose).     omeprazole (PRILOSEC) 40 MG capsule Take 1 capsule (40 mg total) by mouth daily. 90 capsule 1   polyethylene glycol (MIRALAX / GLYCOLAX) 17 g packet Take 17 g by mouth daily as needed for mild constipation.     Riociguat (ADEMPAS) 2 MG TABS Take 1 tablet by mouth. In the morning, at noon, and at bedtime.     rOPINIRole (REQUIP) 1 MG tablet Take 1 tablet (1 mg total) by mouth at bedtime. 90 tablet 2   rosuvastatin (CRESTOR) 20 MG tablet Take 1 tablet (20 mg total) by mouth daily. 90 tablet 3   spironolactone (ALDACTONE) 25 MG tablet Take 1 tablet (25 mg total) by mouth daily. 90 tablet 3   tirzepatide (MOUNJARO) 2.5 MG/0.5ML Pen Inject 2.5 mg into the skin once a week. 2 mL 0   torsemide (DEMADEX) 20 MG tablet Take 1 tablet (20 mg total) by mouth daily. 90 tablet 3   traZODone (DESYREL) 50 MG tablet Take 0.5-1 tablets (25-50 mg total) by mouth at bedtime as needed. for sleep 90 tablet 0   No current facility-administered medications on file prior to visit.    Allergies  Allergen Reactions   Aspirin Other (See Comments)    Irritates the stomach and the patient developed ulcers, also   Lisinopril Other (See Comments)    Caused a body ache     Assessment/Plan:  1. T2DM/obesity - A1c improved from 6.7% to 5.5% with excellent dietary improvements. Pt would still benefit from GLP for cardiac and weight loss benefits. Current BMI 37, weight loss has been  challenging as his mobility is currently limited after recent back surgery. His insurance has covered Mounjaro with $0 copay to pt. Reviewed injection technique today and pt self-administered first dose of Mounjaro 2.5mg .  Advised patient on common side effects including nausea, diarrhea, dyspepsia, decreased appetite, and fatigue. Counseled patient on reducing meal size and how  to titrate medication to minimize side effects. Counseled patient to call if intolerable side effects or if experiencing dehydration, abdominal pain, or dizziness. Patient will adhere to dietary modifications and will target at least 150 minutes of moderate intensity exercise weekly.   I will call pt monthly for dose titrations.  Vesna Kable E. Laisa Larrick, PharmD, BCACP, [facility]. [address] [place], [place] [address] Phone: [phone]; Fax: [phone] 01/02/2023 8:26 AM

## 2023-01-02 ENCOUNTER — Ambulatory Visit: Payer: Medicare Other | Attending: Internal Medicine | Admitting: Pharmacist

## 2023-01-02 ENCOUNTER — Encounter: Payer: Self-pay | Admitting: Pharmacist

## 2023-01-02 VITALS — Wt 274.0 lb

## 2023-01-02 DIAGNOSIS — E669 Obesity, unspecified: Secondary | ICD-10-CM | POA: Insufficient documentation

## 2023-01-02 DIAGNOSIS — E1140 Type 2 diabetes mellitus with diabetic neuropathy, unspecified: Secondary | ICD-10-CM

## 2023-01-02 NOTE — Patient Instructions (Signed)
[facility] This medication reduces your appetite and may make you feel fuller longer.  Stop eating when your body tells you that you are full. This will likely happen sooner than you are used to. Fried/greasy food and sweets may upset your stomach - minimize these as much as possible. Store your medication in the fridge until you are ready to use it. Inject your medication in the fatty tissue of your lower abdominal area (2 inches away from belly button) or upper outer thigh. Rotate injection sites. Common side effects include: nausea, diarrhea/constipation, and heartburn, and are more likely to occur if you overeat. Stop your injection for 7 days prior to surgical procedures requiring anesthesia.  I will call you each month as we increase your dose. Call Jinny Blossom, PharmD with any tolerability or medication backorder issues, [phone]  Tips for success: Write down the reasons why you want to lose weight and post it in a place where you'll see it often.  Start small and work your way up. Keep in mind that it takes time to achieve goals, and small steps add up.  Any additional movements help to burn calories. Taking the stairs rather than the elevator and parking at the far end of your parking lot are easy ways to start. Brisk walking for at least 30 minutes 4 or more days of the week is an excellent goal to work toward  Understanding what it means to feel full: Did you know that it can take 15 minutes or more for your brain to receive the message that you've eaten? That means that, if you eat less food, but consume it slower, you may still feel satisfied.  Eating a lot of fruits and vegetables can also help you feel fuller.  Eat off of smaller plates so that moderate portions don't seem too small  Tips for living a healthier life     Building a Healthy and Balanced Diet Make most of your meal vegetables and fruits -  of your plate. Aim for color and variety, and  remember that potatoes don't count as vegetables on the Healthy Eating Plate because of their negative impact on blood sugar.  Go for whole grains -  of your plate. Whole and intact grains--whole wheat, barley, wheat berries, quinoa, oats, brown rice, and foods made with them, such as whole wheat pasta--have a milder effect on blood sugar and insulin than white bread, white rice, and other refined grains.  Protein power -  of your plate. Fish, poultry, beans, and nuts are all healthy, versatile protein sources--they can be mixed into salads, and pair well with vegetables on a plate. Limit red meat, and avoid processed meats such as bacon and sausage.  Healthy plant oils - in moderation. Choose healthy vegetable oils like olive, canola, soy, corn, sunflower, peanut, and others, and avoid partially hydrogenated oils, which contain unhealthy trans fats. Remember that low-fat does not mean "healthy."  Drink water, coffee, or tea. Skip sugary drinks, limit milk and dairy products to one to two servings per day, and limit juice to a small glass per day.  Stay active. The red figure running across the [place] is a reminder that staying active is also important in weight control.  The main message of the Healthy Eating Plate is to focus on diet quality:  The type of carbohydrate in the diet is more important than the amount of carbohydrate in the diet, because some sources of carbohydrate--like vegetables (other than potatoes),  fruits, whole grains, and beans--are healthier than others. The Healthy Eating Plate also advises consumers to avoid sugary beverages, a major source of calories--usually with little nutritional value--in the American diet. The Healthy Eating Plate encourages consumers to use healthy oils, and it does not set a maximum on the percentage of calories people should get each day from healthy sources of fat. In this way, the Healthy Eating Plate recommends  the opposite of the low-fat message promoted for decades by the USDA.  [employer]  SUGAR  Sugar is a huge problem in the modern day diet. Sugar is a big contributor to heart disease, diabetes, high triglyceride levels, fatty liver disease and obesity. Sugar is hidden in almost all packaged foods/beverages. Added sugar is extra sugar that is added beyond what is naturally found and has no nutritional benefit for your body. The American Heart Association recommends limiting added sugars to no more than 25g for women and 36 grams for men per day. There are many names for sugar including maltose, sucrose (names ending in "ose"), high fructose corn syrup, molasses, cane sugar, corn sweetener, raw sugar, syrup, honey or fruit juice concentrate.   One of the best ways to limit your added sugars is to stop drinking sweetened beverages such as soda, sweet tea, and fruit juice.  There is 65g of added sugars in one 20oz bottle of Coke! That is equal to 7.5 donuts.   Pay attention and read all nutrition facts labels. Below is an examples of a nutrition facts label. The #1 is showing you the total sugars where the # 2 is showing you the added sugars. This one serving has almost the max amount of added sugars per day!   EXERCISE  Exercise is good. We've all heard that. In an ideal world, we would all have time and resources to get plenty of it. When you are active, your heart pumps more efficiently and you will feel better.  Multiple studies show that even walking regularly has benefits that include living a longer life. The American Heart Association recommends 150 minutes per week of exercise (30 minutes per day most days of the week). You can do this in any increment you wish. Nine or more 10-minute walks count. So does an hour-long exercise class. Break the time apart into what will work in your life. Some of the best things you can do include walking  briskly, jogging, cycling or swimming laps. Not everyone is ready to "exercise." Sometimes we need to start with just getting active. Here are some easy ways to be more active throughout the day:  Take the stairs instead of the elevator  Go for a 10-15 minute walk during your lunch break (find a friend to make it more enjoyable)  When shopping, park at the back of the parking lot  If you take public transportation, get off one stop early and walk the extra distance  Pace around while making phone calls  Check with your doctor if you aren't sure what your limitations may be. Always remember to drink plenty of water when doing any type of exercise. Don't feel like a failure if you're not getting the 90-150 minutes per week. If you started by being a couch potato, then just a 10-minute walk each day is a huge improvement. Start with little victories and work your way up.   HEALTHY EATING TIPS              Plan ahead: make a menu of the  meals for a week then create a grocery list to go with that menu. Consider meals that easily stretch into a night of leftovers, such as stews or casseroles. Or consider making two of your favorite meal and put one in the freezer for another night. Try a night or two each week that is "meatless" or "no cook" such as salads. When you get home from the grocery store wash and prepare your vegetables and fruits. Then when you need them they are ready to go.   Tips for going to the grocery store:  [facility] store or generic brands  Check the weekly ad from your store on-line or in their in-store flyer  Look at the unit price on the shelf tag to compare/contrast the costs of different items  Buy fruits/vegetables in season  Carrots, bananas and apples are low-cost, naturally healthy items  If meats or frozen vegetables are on sale, buy some extras and put in your freezer  Limit buying prepared or "ready to eat" items, even if they are pre-made salads or fruit snacks  Do not shop  when you're hungry  Foods at eye level tend to be more expensive. Look on the high and low shelves for deals.  Consider shopping at the farmer's market for fresh foods in season.  Avoid the cookie and chip aisles (these are expensive, high in calories and low in nutritional value). Shop on the outside of the grocery store.  Healthy food preparations:  If you can't get lean hamburger, be sure to drain the fat when cooking  Steam, saut (in olive oil), grill or bake foods  Experiment with different seasonings to avoid adding salt to your foods. Kosher salt, sea salt and Himalayan salt are all still salt and should be avoided. Try seasoning food with onion, garlic, thyme, rosemary, basil ect. Onion powder or garlic powder is ok. Avoid if it says salt (ie garlic salt).

## 2023-01-07 DIAGNOSIS — G4733 Obstructive sleep apnea (adult) (pediatric): Secondary | ICD-10-CM | POA: Diagnosis not present

## 2023-01-11 ENCOUNTER — Telehealth (HOSPITAL_COMMUNITY): Payer: Self-pay

## 2023-01-11 NOTE — Telephone Encounter (Signed)
Confirmed patient's Pulm Rehab Orientation appt for Monday at 10:30. Instructions and directions given for the appointment. Pt understands without assistance.

## 2023-01-14 ENCOUNTER — Encounter (HOSPITAL_COMMUNITY)
Admission: RE | Admit: 2023-01-14 | Discharge: 2023-01-14 | Disposition: A | Discharge status: Home / Self Care | Payer: Medicare Other | Source: Ambulatory Visit | Attending: Cardiology | Admitting: Cardiology

## 2023-01-14 ENCOUNTER — Encounter (HOSPITAL_COMMUNITY): Payer: Self-pay

## 2023-01-14 ENCOUNTER — Telehealth: Payer: Self-pay | Admitting: Family Medicine

## 2023-01-14 VITALS — BP 122/62 | HR 94 | Resp 20 | Ht 72.0 in | Wt 270.5 lb

## 2023-01-14 DIAGNOSIS — I272 Pulmonary hypertension, unspecified: Secondary | ICD-10-CM | POA: Diagnosis not present

## 2023-01-14 NOTE — Telephone Encounter (Signed)
Called patient to schedule Medicare Annual Wellness Visit (AWV). Left message for patient to call back and schedule Medicare Annual Wellness Visit (AWV).  Last date of AWV: 01/09/22  Please schedule an appointment at any time with [facility] or Kriste Basque.  If any questions, please contact me at [phone].  Thank you ,  Rudell Cobb AWV direct phone # [phone]

## 2023-01-14 NOTE — Progress Notes (Signed)
Pulmonary Individual Treatment Plan  Patient Details  Name: Keith [name] MRN: [id number] Date of Birth: 1951/08/10 Referring Provider:   Doristine Devoid Pulmonary Rehab Walk Test from 01/14/2023 in [facility]  Referring Provider [facility], [place]       Initial Encounter Date:  Flowsheet Row Pulmonary Rehab Walk Test from 01/14/2023 in [facility]  Date 01/14/23       Visit Diagnosis: Pulmonary HTN  Patient's Home Medications on Admission:   Current Outpatient Medications:    acetaminophen (TYLENOL) 325 MG tablet, Take 1-2 tablets (325-650 mg total) by mouth every 4 (four) hours as needed for mild pain., Disp: , Rfl:    amLODipine (NORVASC) 10 MG tablet, Take 1 tablet (10 mg total) by mouth daily., Disp: 90 tablet, Rfl: 1   apixaban (ELIQUIS) 5 MG TABS tablet, Take 1 tablet (5 mg total) by mouth 2 (two) times daily., Disp: 60 tablet, Rfl: 11   Blood Glucose Monitoring Suppl (ACCU-CHEK GUIDE ME) w/Device KIT, USE TO TEST BLOOD SUGARS 1-2 TIMES DAILY., Disp: 1 kit, Rfl: 0   carvedilol (COREG) 12.5 MG tablet, Take 1 tablet (12.5 mg total) by mouth 2 (two) times daily with a meal., Disp: 180 tablet, Rfl: 3   cyanocobalamin (VITAMIN B12) 1000 MCG tablet, Take 1,000 mcg by mouth daily., Disp: , Rfl:    DULoxetine (CYMBALTA) 60 MG capsule, TAKE 1 CAPSULE BY MOUTH EVERY DAY, Disp: 90 capsule, Rfl: 0   ferrous sulfate 325 (65 FE) MG EC tablet, Take 325 mg by mouth every other day., Disp: , Rfl:    gabapentin (NEURONTIN) 400 MG capsule, Take 400 mg by mouth 3 (three) times daily., Disp: , Rfl:    HYDROcodone-acetaminophen (NORCO) 7.5-325 MG tablet, Take 1 tablet by mouth in the morning, at noon, in the evening, and at bedtime. 7.5 mg/375 mg, Disp: , Rfl:    hydrocortisone (ANUSOL-HC) 2.5 % rectal cream, Place 1 application. rectally as needed for hemorrhoids or anal itching., Disp: , Rfl:     levETIRAcetam (KEPPRA) 250 MG tablet, Take 1 tablet (250 mg total) by mouth 2 (two) times daily., Disp: 180 tablet, Rfl: 0   lidocaine (LIDODERM) 5 %, Place 1 patch onto the skin daily as needed (pain). Remove & Discard patch within 12 hours or as directed by MD, Disp: , Rfl:    naloxone (NARCAN) nasal spray 4 mg/0.1 mL, Place 1 spray into the nose as needed (accidental overdose)., Disp: , Rfl:    omeprazole (PRILOSEC) 40 MG capsule, Take 1 capsule (40 mg total) by mouth daily., Disp: 90 capsule, Rfl: 1   polyethylene glycol (MIRALAX / GLYCOLAX) 17 g packet, Take 17 g by mouth daily as needed for mild constipation., Disp: , Rfl:    rOPINIRole (REQUIP) 1 MG tablet, Take 1 tablet (1 mg total) by mouth at bedtime., Disp: 90 tablet, Rfl: 2   rosuvastatin (CRESTOR) 20 MG tablet, Take 1 tablet (20 mg total) by mouth daily., Disp: 90 tablet, Rfl: 3   spironolactone (ALDACTONE) 25 MG tablet, Take 1 tablet (25 mg total) by mouth daily., Disp: 90 tablet, Rfl: 3   tirzepatide (MOUNJARO) 2.5 MG/0.5ML Pen, Inject 2.5 mg into the skin once a week., Disp: 2 mL, Rfl: 0   torsemide (DEMADEX) 20 MG tablet, Take 1 tablet (20 mg total) by mouth daily., Disp: 90 tablet, Rfl: 3   traZODone (DESYREL) 50 MG tablet, Take 0.5-1 tablets (  25-50 mg total) by mouth at bedtime as needed. for sleep, Disp: 90 tablet, Rfl: 0   albuterol (VENTOLIN HFA) 108 (90 Base) MCG/ACT inhaler, Inhale 2 puffs into the lungs every 6 (six) hours as needed for wheezing or shortness of breath. (Patient not taking: Reported on 01/14/2023), Disp: 8 g, Rfl: 1   Riociguat (ADEMPAS) 2 MG TABS, Take 1 tablet by mouth. In the morning, at noon, and at bedtime., Disp: , Rfl:   Past Medical History: Past Medical History:  Diagnosis Date   Allergy    Chronic back pain    Diabetes mellitus without complication    DNR (do not resuscitate) 11/19/2020   Duodenal ulcer hemorrhage 08/29/2014   ED (erectile dysfunction)    Esophageal stricture 08/30/2014    Glucose intolerance (impaired glucose tolerance)    Hiatal hernia 08/30/2014   Hypertension    Hypokalemia 04/11/2013   MRSA carrier 08/30/2014   Obesity    Osteoarthritis    Pneumonia    Pulmonary embolism 06/2020   Scoliosis 2016   Spinal stenosis of lumbar region    Urinary tract infection 04/11/2013    Tobacco Use: Social History   Tobacco Use  Smoking Status Former   Packs/day: 1.00   Years: 25.00   Additional pack years: 0.00   Total pack years: 25.00   Types: Cigarettes   Quit date: 231995   Years since quitting: 29.2   Passive exposure: Never  Smokeless Tobacco Never    Labs: Review Flowsheet  More data exists      Latest Ref Rng & Units 03/02/2022 07/06/2022 07/18/2022 11/02/2022 12/24/2022  Labs for ITP Cardiac and Pulmonary Rehab  Hemoglobin A1c 4.8 - 5.6 % - - 6.2  6.1  5.5   PH, Arterial 7.35 - 7.45 7.35 - 7.45 7.429  7.438  - - - -  PCO2 arterial 32 - 48 mmHg 32 - 48 mmHg 46.7  46.1  - - - -  Bicarbonate 20.0 - 28.0 mmol/L 20.0 - 28.0 mmol/L 31.0  31.2  35.2  28.5  27.3  - - -  TCO2 22 - 32 mmol/L 22 - 32 mmol/L 32  33  37  30  29  - - -  O2 Saturation % % 96  97  57  60  59  - - -    Capillary Blood Glucose: Lab Results  Component Value Date   GLUCAP 127 (H) 07/06/2022   GLUCAP 106 (H) 09/06/2021   GLUCAP 96 09/05/2021   GLUCAP 78 09/05/2021   GLUCAP 98 09/04/2021     Pulmonary Assessment Scores:  Pulmonary Assessment Scores     Row Name 01/14/23 1258         ADL UCSD   SOB Score total 76       CAT Score   CAT Score 25       mMRC Score   mMRC Score 4             UCSD: Self-administered rating of dyspnea associated with activities of daily living (ADLs) 6-point scale (0 = "not at all" to 5 = "maximal or unable to do because of breathlessness")  Scoring Scores range from 0 to 120.  Minimally important difference is 5 units  CAT: CAT can identify the health impairment of COPD patients and is better correlated with disease  progression.  CAT has a scoring range of zero to 40. The CAT score is classified into four groups of low (less than 10), medium (10 -  20), high (21-30) and very high (31-40) based on the impact level of disease on health status. A CAT score over 10 suggests significant symptoms.  A worsening CAT score could be explained by an exacerbation, poor medication adherence, poor inhaler technique, or progression of COPD or comorbid conditions.  CAT MCID is 2 points  mMRC: mMRC (Modified Medical Research Council) Dyspnea Scale is used to assess the degree of baseline functional disability in patients of respiratory disease due to dyspnea. No minimal important difference is established. A decrease in score of 1 point or greater is considered a positive change.   Pulmonary Function Assessment:  Pulmonary Function Assessment - 01/14/23 1301       Breath   Bilateral Breath Sounds Decreased    Shortness of Breath Limiting activity;Yes             Exercise Target Goals: Exercise Program Goal: Individual exercise prescription set using results from initial 6 min walk test and THRR while considering  patient's activity barriers and safety.   Exercise Prescription Goal: Initial exercise prescription builds to 30-45 minutes a day of aerobic activity, 2-3 days per week.  Home exercise guidelines will be given to patient during program as part of exercise prescription that the participant will acknowledge.  Activity Barriers & Risk Stratification:  Activity Barriers & Cardiac Risk Stratification - 01/14/23 1104       Activity Barriers & Cardiac Risk Stratification   Activity Barriers Deconditioning;Muscular Weakness;Shortness of Breath;Back Problems;Balance Concerns             6 Minute Walk:  6 Minute Walk     Row Name 01/14/23 1207         6 Minute Walk   Phase Initial  Performed on NuStep     Distance 1049.9 feet     Walk Time 6 minutes     # of Rest Breaks 0     MPH 1.99      METS 2.1     RPE 9     Perceived Dyspnea  1     VO2 Peak 7.36     Resting HR 94 bpm     Resting BP 122/62     Resting Oxygen Saturation  96 %     Exercise Oxygen Saturation  during 6 min walk 96 %     Max Ex. HR 115 bpm     Max Ex. BP 134/62     2 Minute Post BP 120/60       Interval HR   1 Minute HR 105     2 Minute HR 107     3 Minute HR 109     4 Minute HR 111     5 Minute HR 106     6 Minute HR 115     2 Minute Post HR 98     Interval Heart Rate? Yes       Interval Oxygen   Interval Oxygen? Yes     Baseline Oxygen Saturation % 96 %     1 Minute Oxygen Saturation % 97 %     1 Minute Liters of Oxygen 0 L     2 Minute Oxygen Saturation % 98 %     2 Minute Liters of Oxygen 0 L     3 Minute Oxygen Saturation % 98 %     3 Minute Liters of Oxygen 0 L     4 Minute Oxygen Saturation % 98 %     4  Minute Liters of Oxygen 0 L     5 Minute Oxygen Saturation % 99 %     5 Minute Liters of Oxygen 0 L     6 Minute Oxygen Saturation % 97 %     6 Minute Liters of Oxygen 0 L     2 Minute Post Oxygen Saturation % 97 %     2 Minute Post Liters of Oxygen 0 L              Oxygen Initial Assessment:  Oxygen Initial Assessment - 01/14/23 1223       Home Oxygen   Home Oxygen Device None    Sleep Oxygen Prescription None    Home Exercise Oxygen Prescription None    Home Resting Oxygen Prescription None      Initial 6 min Walk   Oxygen Used None      Program Oxygen Prescription   Program Oxygen Prescription None      Intervention   Short Term Goals To learn and understand importance of maintaining oxygen saturations>88%;To learn and demonstrate proper use of respiratory medications;To learn and understand importance of monitoring SPO2 with pulse oximeter and demonstrate accurate use of the pulse oximeter.;To learn and demonstrate proper pursed lip breathing techniques or other breathing techniques.     Long  Term Goals Verbalizes importance of monitoring SPO2 with pulse  oximeter and return demonstration;Maintenance of O2 saturations>88%;Exhibits proper breathing techniques, such as pursed lip breathing or other method taught during program session;Compliance with respiratory medication;Demonstrates proper use of MDI's             Oxygen Re-Evaluation:   Oxygen Discharge (Final Oxygen Re-Evaluation):   Initial Exercise Prescription:  Initial Exercise Prescription - 01/14/23 1200       Date of Initial Exercise RX and Referring Provider   Date 01/14/23    Referring Provider [name]    Expected Discharge Date 04/09/23      T5 Nustep   Level 1    SPM 50    Minutes 30    METs 2.1      Prescription Details   Frequency (times per week) 2    Duration Progress to 30 minutes of continuous aerobic without signs/symptoms of physical distress      Intensity   THRR 40-80% of Max Heartrate 60-120    Ratings of Perceived Exertion 11-13    Perceived Dyspnea 0-4      Progression   Progression Continue progressive overload as per policy without signs/symptoms or physical distress.      Resistance Training   Training Prescription Yes    Weight red bands    Reps 10-15             Perform Capillary Blood Glucose checks as needed.  Exercise Prescription Changes:   Exercise Comments:   Exercise Goals and Review:   Exercise Goals     Row Name 01/14/23 1101             Exercise Goals   Increase Physical Activity Yes       Intervention Provide advice, education, support and counseling about physical activity/exercise needs.;Develop an individualized exercise prescription for aerobic and resistive training based on initial evaluation findings, risk stratification, comorbidities and participant's personal goals.       Expected Outcomes Short Term: Attend rehab on a regular basis to increase amount of physical activity.;Long Term: Exercising regularly at least 3-5 days a week.;Long Term: Add in home exercise to make exercise part of  routine  and to increase amount of physical activity.       Increase Strength and Stamina Yes       Intervention Provide advice, education, support and counseling about physical activity/exercise needs.;Develop an individualized exercise prescription for aerobic and resistive training based on initial evaluation findings, risk stratification, comorbidities and participant's personal goals.       Expected Outcomes Short Term: Increase workloads from initial exercise prescription for resistance, speed, and METs.;Short Term: Perform resistance training exercises routinely during rehab and add in resistance training at home;Long Term: Improve cardiorespiratory fitness, muscular endurance and strength as measured by increased METs and functional capacity ( )       Able to understand and use rate of perceived exertion (RPE) scale Yes       Intervention Provide education and explanation on how to use RPE scale       Expected Outcomes Short Term: Able to use RPE daily in rehab to express subjective intensity level;Long Term:  Able to use RPE to guide intensity level when exercising independently       Able to understand and use Dyspnea scale Yes       Intervention Provide education and explanation on how to use Dyspnea scale       Expected Outcomes Short Term: Able to use Dyspnea scale daily in rehab to express subjective sense of shortness of breath during exertion;Long Term: Able to use Dyspnea scale to guide intensity level when exercising independently       Knowledge and understanding of Target Heart Rate Range (THRR) Yes       Intervention Provide education and explanation of THRR including how the numbers were predicted and where they are located for reference       Expected Outcomes Short Term: Able to state/look up THRR;Short Term: Able to use daily as guideline for intensity in rehab;Long Term: Able to use THRR to govern intensity when exercising independently       Understanding of Exercise Prescription  Yes       Intervention Provide education, explanation, and written materials on patient's individual exercise prescription       Expected Outcomes Short Term: Able to explain program exercise prescription;Long Term: Able to explain home exercise prescription to exercise independently                Exercise Goals Re-Evaluation :   Discharge Exercise Prescription (Final Exercise Prescription Changes):   Nutrition:  Target Goals: Understanding of nutrition guidelines, daily intake of sodium 1500mg , cholesterol 200mg , calories 30% from fat and 7% or less from saturated fats, daily to have 5 or more servings of fruits and vegetables.  Biometrics:    Nutrition Therapy Plan and Nutrition Goals:   Nutrition Assessments:  MEDIFICTS Score Key: ?70 Need to make dietary changes  40-70 Heart Healthy Diet ? 40 Therapeutic Level Cholesterol Diet   Picture Your Plate Scores: <32 Unhealthy dietary pattern with much room for improvement. 41-50 Dietary pattern unlikely to meet recommendations for good health and room for improvement. 51-60 More healthful dietary pattern, with some room for improvement.  >60 Healthy dietary pattern, although there may be some specific behaviors that could be improved.    Nutrition Goals Re-Evaluation:   Nutrition Goals Discharge (Final Nutrition Goals Re-Evaluation):   Psychosocial: Target Goals: Acknowledge presence or absence of significant depression and/or stress, maximize coping skills, provide positive support system. Participant is able to verbalize types and ability to use techniques and skills needed for reducing stress and  depression.  Initial Review & Psychosocial Screening:  Initial Psych Review & Screening - 01/14/23 1050       Initial Review   Current issues with Current Sleep Concerns      Family Dynamics   Good Support System? Yes    Comments Pt wears Cpap nightly. Can only tolerate for about 4 hours. Pt also stated he can  only sleep on his back due to prior surgeries. Pt has sleeping meds he takes. He states that these do help.      Barriers   Psychosocial barriers to participate in program Psychosocial barriers identified (see note)      Screening Interventions   Interventions Encouraged to exercise    Expected Outcomes Long Term Goal: Stressors or current issues are controlled or eliminated.             Quality of Life Scores:  Scores of 19 and below usually indicate a poorer quality of life in these areas.  A difference of  2-3 points is a clinically meaningful difference.  A difference of 2-3 points in the total score of the Quality of Life Index has been associated with significant improvement in overall quality of life, self-image, physical symptoms, and general health in studies assessing change in quality of life.  PHQ-9: Review Flowsheet  More data exists      01/14/2023 11/02/2022 06/06/2022 05/25/2022 04/02/2022  Depression screen PHQ 2/9  Decreased Interest 0 0 0 0 0  Down, Depressed, Hopeless 0 0 0 0 0  PHQ - 2 Score 0 0 0 0 0  Altered sleeping 1 - - - -  Tired, decreased energy 1 - - - -  Change in appetite 0 - - - -  Feeling bad or failure about yourself  0 - - - -  Trouble concentrating 0 - - - -  Moving slowly or fidgety/restless 0 - - - -  Suicidal thoughts 0 - - - -  PHQ-9 Score 2 - - - -  Difficult doing work/chores Not difficult at all - - - -   Interpretation of Total Score  Total Score Depression Severity:  1-4 = Minimal depression, 5-9 = Mild depression, 10-14 = Moderate depression, 15-19 = Moderately severe depression, 20-27 = Severe depression   Psychosocial Evaluation and Intervention:  Psychosocial Evaluation - 01/14/23 1059       Psychosocial Evaluation & Interventions   Interventions Encouraged to exercise with the program and follow exercise prescription    Comments Pt admits to trouble sleeping, but stated it's better than it use to be. He is currently working  with his MD for medication needed.    Expected Outcomes For Iseah to attend PR without any psychosocial barriers or concerns.    Continue Psychosocial Services  No Follow up required             Psychosocial Re-Evaluation:  Psychosocial Re-Evaluation     Row Name 01/14/23 1249             Psychosocial Re-Evaluation   Current issues with Current Sleep Concerns       Comments No changes from orientation       Expected Outcomes For Abbas to attend PR without any psychosocial barriers or concerns.       Interventions Encouraged to attend Pulmonary Rehabilitation for the exercise       Continue Psychosocial Services  No Follow up required  Psychosocial Discharge (Final Psychosocial Re-Evaluation):  Psychosocial Re-Evaluation - 01/14/23 1249       Psychosocial Re-Evaluation   Current issues with Current Sleep Concerns    Comments No changes from orientation    Expected Outcomes For Macio to attend PR without any psychosocial barriers or concerns.    Interventions Encouraged to attend Pulmonary Rehabilitation for the exercise    Continue Psychosocial Services  No Follow up required             Education: Education Goals: Education classes will be provided on a weekly basis, covering required topics. Participant will state understanding/return demonstration of topics presented.  Learning Barriers/Preferences:   Education Topics: Introduction to Pulmonary Rehab Group instruction provided by PowerPoint, verbal discussion, and written material to support subject matter. Instructor reviews what Pulmonary Rehab is, the purpose of the program, and how patients are referred.     Know Your Numbers Group instruction that is supported by a PowerPoint presentation. Instructor discusses importance of knowing and understanding resting, exercise, and post-exercise oxygen saturation, heart rate, and blood pressure. Oxygen saturation, heart rate, blood pressure,  rating of perceived exertion, and dyspnea are reviewed along with a normal range for these values.    Exercise for the Pulmonary Patient Group instruction that is supported by a PowerPoint presentation. Instructor discusses benefits of exercise, core components of exercise, frequency, duration, and intensity of an exercise routine, importance of utilizing pulse oximetry during exercise, safety while exercising, and options of places to exercise outside of rehab.    MET Level  Group instruction provided by PowerPoint, verbal discussion, and written material to support subject matter. Instructor reviews what METs are and how to increase METs.    Pulmonary Medications Verbally interactive group education provided by instructor with focus on inhaled medications and proper administration.   Anatomy and Physiology of the Respiratory System Group instruction provided by PowerPoint, verbal discussion, and written material to support subject matter. Instructor reviews respiratory cycle and anatomical components of the respiratory system and their functions. Instructor also reviews differences in obstructive and restrictive respiratory diseases with examples of each.    Oxygen Safety Group instruction provided by PowerPoint, verbal discussion, and written material to support subject matter. There is an overview of "What is Oxygen" and "Why do we need it".  Instructor also reviews how to create a safe environment for oxygen use, the importance of using oxygen as prescribed, and the risks of noncompliance. There is a brief discussion on traveling with oxygen and resources the patient may utilize.   Oxygen Use Group instruction provided by PowerPoint, verbal discussion, and written material to discuss how supplemental oxygen is prescribed and different types of oxygen supply systems. Resources for more information are provided.    Breathing Techniques Group instruction that is supported by  demonstration and informational handouts. Instructor discusses the benefits of pursed lip and diaphragmatic breathing and detailed demonstration on how to perform both.     Risk Factor Reduction Group instruction that is supported by a PowerPoint presentation. Instructor discusses the definition of a risk factor, different risk factors for pulmonary disease, and how the heart and lungs work together.   MD Day A group question and answer session with a medical doctor that allows participants to ask questions that relate to their pulmonary disease state.   Nutrition for the Pulmonary Patient Group instruction provided by PowerPoint slides, verbal discussion, and written materials to support subject matter. The instructor gives an explanation and review of healthy diet recommendations,  which includes a discussion on weight management, recommendations for fruit and vegetable consumption, as well as protein, fluid, caffeine, fiber, sodium, sugar, and alcohol. Tips for eating when patients are short of breath are discussed.    Other Education Group or individual verbal, written, or video instructions that support the educational goals of the pulmonary rehab program.    Knowledge Questionnaire Score:  Knowledge Questionnaire Score - 01/14/23 1300       Knowledge Questionnaire Score   Pre Score 16/18             Core Components/Risk Factors/Patient Goals at Admission:  Personal Goals and Risk Factors at Admission - 01/14/23 1100       Core Components/Risk Factors/Patient Goals on Admission    Weight Management Weight Loss    Improve shortness of breath with ADL's Yes    Intervention Provide education, individualized exercise plan and daily activity instruction to help decrease symptoms of SOB with activities of daily living.    Expected Outcomes Short Term: Improve cardiorespiratory fitness to achieve a reduction of symptoms when performing ADLs             Core  Components/Risk Factors/Patient Goals Review:   Goals and Risk Factor Review     Row Name 01/14/23 1250             Core Components/Risk Factors/Patient Goals Review   Personal Goals Review Weight Management/Obesity;Improve shortness of breath with ADL's;Develop more efficient breathing techniques such as purse lipped breathing and diaphragmatic breathing and practicing self-pacing with activity.       Review Unable to assess goals as patient has not started the program yet. He is scheduled to start on 01/22/2023       Expected Outcomes See Admission goals                Core Components/Risk Factors/Patient Goals at Discharge (Final Review):   Goals and Risk Factor Review - 01/14/23 1250       Core Components/Risk Factors/Patient Goals Review   Personal Goals Review Weight Management/Obesity;Improve shortness of breath with ADL's;Develop more efficient breathing techniques such as purse lipped breathing and diaphragmatic breathing and practicing self-pacing with activity.    Review Unable to assess goals as patient has not started the program yet. He is scheduled to start on 01/22/2023    Expected Outcomes See Admission goals             ITP Comments:   Comments: Dr. Mechele Collin is Medical Director for Pulmonary Rehab at [facility].

## 2023-01-14 NOTE — Progress Notes (Signed)
Keith [name] 72 y.o. male Pulmonary Rehab Orientation Note  This patient who was referred to Pulmonary Rehab by Dr. Jearld Pies with the diagnosis of Pulmonary Hypertension arrived today in Cardiac and Pulmonary Rehab. He arrived in wheelchair with ataxic gait. He does not carry portable oxygen. Color good, skin warm and dry. Patient is oriented to time and place. Patient's medical history, psychosocial health, and medications reviewed.  Psychosocial assessment reveals patient lives with spouse. Keith [name] is currently retired. Patient hobbies include watching TV, anything [employer], and traveling. Patient reports his stress level is low. Areas of stress/anxiety include health. Patient does not exhibit signs of depression. PHQ2/9 score 0/2. Keith [name] shows good  coping skills with positive outlook on life. Offered emotional support and reassurance. Will continue to monitor and evaluate progress toward psychosocial goal(s) of decreased stress about his health.   Physical assessment reveals patient is alert and oriented x 4.  Heart rate is normal, breath sounds clear to auscultation, no wheezes, rales, or rhonchi. Pt denies chronic cough. Bowel sounds present x 4 quads.  Pt endorses nausea due to weight loss medication, no vomiting or diarrhea. Grip strength equal, strong. Distal pulses +1; +2 swelling to lower extremities. Pt wearing left ankle/leg brace. Keith [name] reports he  does take medications as prescribed.   Patient states he  follows a low fat  and low sodium  diet. The patient has been trying to lose weight through a healthy diet and exercise program. Pt's weight will be monitored closely. Demonstration and practice of PLB using pulse oximeter. Keith [name] able to return demonstration satisfactorily. Safety and hand hygiene in the exercise area reviewed with patient. Keith [name] voices understanding of the information reviewed. Department expectations discussed with patient and achievable goals were set. The patient  shows enthusiasm about attending the program and we look forward to working with Keith [name]. Keith [name] completed a 6 min walk test today and is scheduled to begin exercise on 4/16 at 1015.   [id number] Keith Hart, RN, BSN

## 2023-01-16 NOTE — Progress Notes (Signed)
Pulmonary Individual Treatment Plan  Patient Details  Name: Keith [name] MRN: [id number] Date of Birth: July 29, 1951 Referring Provider:   Doristine Devoid Pulmonary Rehab Walk Test from 01/14/2023 in [facility]  Referring Provider [facility]       Initial Encounter Date:  Flowsheet Row Pulmonary Rehab Walk Test from 01/14/2023 in [facility] for [facility], V[facility], [facility]  Date 01/14/23       Visit Diagnosis: Pulmonary HTN  Patient's Home Medications on Admission:   Current Outpatient Medications:    acetaminophen (TYLENOL) 325 MG tablet, Take 1-2 tablets (325-650 mg total) by mouth every 4 (four) hours as needed for mild pain., Disp: , Rfl:    amLODipine (NORVASC) 10 MG tablet, Take 1 tablet (10 mg total) by mouth daily., Disp: 90 tablet, Rfl: 1   apixaban (ELIQUIS) 5 MG TABS tablet, Take 1 tablet (5 mg total) by mouth 2 (two) times daily., Disp: 60 tablet, Rfl: 11   Blood Glucose Monitoring Suppl (ACCU-CHEK GUIDE ME) w/Device KIT, USE TO TEST BLOOD SUGARS 1-2 TIMES DAILY., Disp: 1 kit, Rfl: 0   carvedilol (COREG) 12.5 MG tablet, Take 1 tablet (12.5 mg total) by mouth 2 (two) times daily with a meal., Disp: 180 tablet, Rfl: 3   cyanocobalamin (VITAMIN B12) 1000 MCG tablet, Take 1,000 mcg by mouth daily., Disp: , Rfl:    DULoxetine (CYMBALTA) 60 MG capsule, TAKE 1 CAPSULE BY MOUTH EVERY DAY, Disp: 90 capsule, Rfl: 0   ferrous sulfate 325 (65 FE) MG EC tablet, Take 325 mg by mouth every other day., Disp: , Rfl:    gabapentin (NEURONTIN) 400 MG capsule, Take 400 mg by mouth 3 (three) times daily., Disp: , Rfl:    HYDROcodone-acetaminophen (NORCO) 7.5-325 MG tablet, Take 1 tablet by mouth in the morning, at noon, in the evening, and at bedtime. 7.5 mg/375 mg, Disp: , Rfl:    hydrocortisone (ANUSOL-HC) 2.5 % rectal cream, Place 1 application. rectally as needed for hemorrhoids or anal itching., Disp: , Rfl:     levETIRAcetam (KEPPRA) 250 MG tablet, Take 1 tablet (250 mg total) by mouth 2 (two) times daily., Disp: 180 tablet, Rfl: 0   lidocaine (LIDODERM) 5 %, Place 1 patch onto the skin daily as needed (pain). Remove & Discard patch within 12 hours or as directed by MD, Disp: , Rfl:    naloxone (NARCAN) nasal spray 4 mg/0.1 mL, Place 1 spray into the nose as needed (accidental overdose)., Disp: , Rfl:    omeprazole (PRILOSEC) 40 MG capsule, Take 1 capsule (40 mg total) by mouth daily., Disp: 90 capsule, Rfl: 1   polyethylene glycol (MIRALAX / GLYCOLAX) 17 g packet, Take 17 g by mouth daily as needed for mild constipation., Disp: , Rfl:    rOPINIRole (REQUIP) 1 MG tablet, Take 1 tablet (1 mg total) by mouth at bedtime., Disp: 90 tablet, Rfl: 2   rosuvastatin (CRESTOR) 20 MG tablet, Take 1 tablet (20 mg total) by mouth daily., Disp: 90 tablet, Rfl: 3   spironolactone (ALDACTONE) 25 MG tablet, Take 1 tablet (25 mg total) by mouth daily., Disp: 90 tablet, Rfl: 3   tirzepatide (MOUNJARO) 2.5 MG/0.5ML Pen, Inject 2.5 mg into the skin once a week., Disp: 2 mL, Rfl: 0   torsemide (DEMADEX) 20 MG tablet, Take 1 tablet (20 mg total) by mouth daily., Disp: 90 tablet, Rfl: 3   traZODone (DESYREL) 50 MG tablet, Take 0.5-1 tablets (  25-50 mg total) by mouth at bedtime as needed. for sleep, Disp: 90 tablet, Rfl: 0   albuterol (VENTOLIN HFA) 108 (90 Base) MCG/ACT inhaler, Inhale 2 puffs into the lungs every 6 (six) hours as needed for wheezing or shortness of breath. (Patient not taking: Reported on 01/14/2023), Disp: 8 g, Rfl: 1   Riociguat (ADEMPAS) 2 MG TABS, Take 1 tablet by mouth. In the morning, at noon, and at bedtime., Disp: , Rfl:   Past Medical History: Past Medical History:  Diagnosis Date   Allergy    Chronic back pain    Diabetes mellitus without complication    DNR (do not resuscitate) 11/19/2020   Duodenal ulcer hemorrhage 08/29/2014   ED (erectile dysfunction)    Esophageal stricture 08/30/2014    Glucose intolerance (impaired glucose tolerance)    Hiatal hernia 08/30/2014   Hypertension    Hypokalemia 04/11/2013   MRSA carrier 08/30/2014   Obesity    Osteoarthritis    Pneumonia    Pulmonary embolism 06/2020   Scoliosis 2016   Spinal stenosis of lumbar region    Urinary tract infection 04/11/2013    Tobacco Use: Social History   Tobacco Use  Smoking Status Former   Packs/day: 1.00   Years: 25.00   Additional pack years: 0.00   Total pack years: 25.00   Types: Cigarettes   Quit date: 231995   Years since quitting: 29.2   Passive exposure: Never  Smokeless Tobacco Never    Labs: Review Flowsheet  More data exists      Latest Ref Rng & Units 03/02/2022 07/06/2022 07/18/2022 11/02/2022 12/24/2022  Labs for ITP Cardiac and Pulmonary Rehab  Hemoglobin A1c 4.8 - 5.6 % - - 6.2  6.1  5.5   PH, Arterial 7.35 - 7.45 7.35 - 7.45 7.429  7.438  - - - -  PCO2 arterial 32 - 48 mmHg 32 - 48 mmHg 46.7  46.1  - - - -  Bicarbonate 20.0 - 28.0 mmol/L 20.0 - 28.0 mmol/L 31.0  31.2  35.2  28.5  27.3  - - -  TCO2 22 - 32 mmol/L 22 - 32 mmol/L 32  33  37  30  29  - - -  O2 Saturation % % 96  97  57  60  59  - - -    Capillary Blood Glucose: Lab Results  Component Value Date   GLUCAP 127 (H) 07/06/2022   GLUCAP 106 (H) 09/06/2021   GLUCAP 96 09/05/2021   GLUCAP 78 09/05/2021   GLUCAP 98 09/04/2021     Pulmonary Assessment Scores:  Pulmonary Assessment Scores     Row Name 01/14/23 1258         ADL UCSD   SOB Score total 76       CAT Score   CAT Score 25       mMRC Score   mMRC Score 4             UCSD: Self-administered rating of dyspnea associated with activities of daily living (ADLs) 6-point scale (0 = "not at all" to 5 = "maximal or unable to do because of breathlessness")  Scoring Scores range from 0 to 120.  Minimally important difference is 5 units  CAT: CAT can identify the health impairment of COPD patients and is better correlated with disease  progression.  CAT has a scoring range of zero to 40. The CAT score is classified into four groups of low (less than 10), medium (10 -  20), high (21-30) and very high (31-40) based on the impact level of disease on health status. A CAT score over 10 suggests significant symptoms.  A worsening CAT score could be explained by an exacerbation, poor medication adherence, poor inhaler technique, or progression of COPD or comorbid conditions.  CAT MCID is 2 points  mMRC: mMRC (Modified Medical Research Council) Dyspnea Scale is used to assess the degree of baseline functional disability in patients of respiratory disease due to dyspnea. No minimal important difference is established. A decrease in score of 1 point or greater is considered a positive change.   Pulmonary Function Assessment:  Pulmonary Function Assessment - 01/14/23 1301       Breath   Bilateral Breath Sounds Decreased    Shortness of Breath Limiting activity;Yes             Exercise Target Goals: Exercise Program Goal: Individual exercise prescription set using results from initial 6 min walk test and THRR while considering  patient's activity barriers and safety.   Exercise Prescription Goal: Initial exercise prescription builds to 30-45 minutes a day of aerobic activity, 2-3 days per week.  Home exercise guidelines will be given to patient during program as part of exercise prescription that the participant will acknowledge.  Activity Barriers & Risk Stratification:  Activity Barriers & Cardiac Risk Stratification - 01/14/23 1104       Activity Barriers & Cardiac Risk Stratification   Activity Barriers Deconditioning;Muscular Weakness;Shortness of Breath;Back Problems;Balance Concerns             6 Minute Walk:  6 Minute Walk     Row Name 01/14/23 1207         6 Minute Walk   Phase Initial  Performed on NuStep     Distance 1049.9 feet     Walk Time 6 minutes     # of Rest Breaks 0     MPH 1.99      METS 2.1     RPE 9     Perceived Dyspnea  1     VO2 Peak 7.36     Resting HR 94 bpm     Resting BP 122/62     Resting Oxygen Saturation  96 %     Exercise Oxygen Saturation  during 6 min walk 96 %     Max Ex. HR 115 bpm     Max Ex. BP 134/62     2 Minute Post BP 120/60       Interval HR   1 Minute HR 105     2 Minute HR 107     3 Minute HR 109     4 Minute HR 111     5 Minute HR 106     6 Minute HR 115     2 Minute Post HR 98     Interval Heart Rate? Yes       Interval Oxygen   Interval Oxygen? Yes     Baseline Oxygen Saturation % 96 %     1 Minute Oxygen Saturation % 97 %     1 Minute Liters of Oxygen 0 L     2 Minute Oxygen Saturation % 98 %     2 Minute Liters of Oxygen 0 L     3 Minute Oxygen Saturation % 98 %     3 Minute Liters of Oxygen 0 L     4 Minute Oxygen Saturation % 98 %     4  Minute Liters of Oxygen 0 L     5 Minute Oxygen Saturation % 99 %     5 Minute Liters of Oxygen 0 L     6 Minute Oxygen Saturation % 97 %     6 Minute Liters of Oxygen 0 L     2 Minute Post Oxygen Saturation % 97 %     2 Minute Post Liters of Oxygen 0 L              Oxygen Initial Assessment:  Oxygen Initial Assessment - 01/14/23 1223       Home Oxygen   Home Oxygen Device None    Sleep Oxygen Prescription None    Home Exercise Oxygen Prescription None    Home Resting Oxygen Prescription None      Initial 6 min Walk   Oxygen Used None      Program Oxygen Prescription   Program Oxygen Prescription None      Intervention   Short Term Goals To learn and understand importance of maintaining oxygen saturations>88%;To learn and demonstrate proper use of respiratory medications;To learn and understand importance of monitoring SPO2 with pulse oximeter and demonstrate accurate use of the pulse oximeter.;To learn and demonstrate proper pursed lip breathing techniques or other breathing techniques.     Long  Term Goals Verbalizes importance of monitoring SPO2 with pulse  oximeter and return demonstration;Maintenance of O2 saturations>88%;Exhibits proper breathing techniques, such as pursed lip breathing or other method taught during program session;Compliance with respiratory medication;Demonstrates proper use of MDI's             Oxygen Re-Evaluation:  Oxygen Re-Evaluation     Row Name 01/15/23 0841             Program Oxygen Prescription   Program Oxygen Prescription None         Home Oxygen   Home Oxygen Device None       Sleep Oxygen Prescription None       Home Exercise Oxygen Prescription None       Home Resting Oxygen Prescription None         Goals/Expected Outcomes   Short Term Goals To learn and understand importance of maintaining oxygen saturations>88%;To learn and demonstrate proper use of respiratory medications;To learn and understand importance of monitoring SPO2 with pulse oximeter and demonstrate accurate use of the pulse oximeter.;To learn and demonstrate proper pursed lip breathing techniques or other breathing techniques.        Long  Term Goals Verbalizes importance of monitoring SPO2 with pulse oximeter and return demonstration;Maintenance of O2 saturations>88%;Exhibits proper breathing techniques, such as pursed lip breathing or other method taught during program session;Compliance with respiratory medication;Demonstrates proper use of MDI's       Goals/Expected Outcomes Compliance and understanding of oxygen saturation monitoring and breathing techniques to decrease shortness of breath.                Oxygen Discharge (Final Oxygen Re-Evaluation):  Oxygen Re-Evaluation - 01/15/23 0841       Program Oxygen Prescription   Program Oxygen Prescription None      Home Oxygen   Home Oxygen Device None    Sleep Oxygen Prescription None    Home Exercise Oxygen Prescription None    Home Resting Oxygen Prescription None      Goals/Expected Outcomes   Short Term Goals To learn and understand importance of maintaining  oxygen saturations>88%;To learn and demonstrate proper use of respiratory medications;To learn and  understand importance of monitoring SPO2 with pulse oximeter and demonstrate accurate use of the pulse oximeter.;To learn and demonstrate proper pursed lip breathing techniques or other breathing techniques.     Long  Term Goals Verbalizes importance of monitoring SPO2 with pulse oximeter and return demonstration;Maintenance of O2 saturations>88%;Exhibits proper breathing techniques, such as pursed lip breathing or other method taught during program session;Compliance with respiratory medication;Demonstrates proper use of MDI's    Goals/Expected Outcomes Compliance and understanding of oxygen saturation monitoring and breathing techniques to decrease shortness of breath.             Initial Exercise Prescription:  Initial Exercise Prescription - 01/14/23 1200       Date of Initial Exercise RX and Referring Provider   Date 01/14/23    Referring Provider [name]    Expected Discharge Date 04/09/23      T5 Nustep   Level 1    SPM 50    Minutes 30    METs 2.1      Prescription Details   Frequency (times per week) 2    Duration Progress to 30 minutes of continuous aerobic without signs/symptoms of physical distress      Intensity   THRR 40-80% of Max Heartrate 60-120    Ratings of Perceived Exertion 11-13    Perceived Dyspnea 0-4      Progression   Progression Continue progressive overload as per policy without signs/symptoms or physical distress.      Resistance Training   Training Prescription Yes    Weight red bands    Reps 10-15             Perform Capillary Blood Glucose checks as needed.  Exercise Prescription Changes:   Exercise Comments:   Exercise Goals and Review:   Exercise Goals     Row Name 01/14/23 1101 01/15/23 0840           Exercise Goals   Increase Physical Activity Yes Yes      Intervention Provide advice, education, support and counseling  about physical activity/exercise needs.;Develop an individualized exercise prescription for aerobic and resistive training based on initial evaluation findings, risk stratification, comorbidities and participant's personal goals. Provide advice, education, support and counseling about physical activity/exercise needs.;Develop an individualized exercise prescription for aerobic and resistive training based on initial evaluation findings, risk stratification, comorbidities and participant's personal goals.      Expected Outcomes Short Term: Attend rehab on a regular basis to increase amount of physical activity.;Long Term: Exercising regularly at least 3-5 days a week.;Long Term: Add in home exercise to make exercise part of routine and to increase amount of physical activity. Short Term: Attend rehab on a regular basis to increase amount of physical activity.;Long Term: Exercising regularly at least 3-5 days a week.;Long Term: Add in home exercise to make exercise part of routine and to increase amount of physical activity.      Increase Strength and Stamina Yes Yes      Intervention Provide advice, education, support and counseling about physical activity/exercise needs.;Develop an individualized exercise prescription for aerobic and resistive training based on initial evaluation findings, risk stratification, comorbidities and participant's personal goals. Provide advice, education, support and counseling about physical activity/exercise needs.;Develop an individualized exercise prescription for aerobic and resistive training based on initial evaluation findings, risk stratification, comorbidities and participant's personal goals.      Expected Outcomes Short Term: Increase workloads from initial exercise prescription for resistance, speed, and METs.;Short Term: Perform resistance training exercises  routinely during rehab and add in resistance training at home;Long Term: Improve cardiorespiratory fitness,  muscular endurance and strength as measured by increased METs and functional capacity ( ) Short Term: Increase workloads from initial exercise prescription for resistance, speed, and METs.;Short Term: Perform resistance training exercises routinely during rehab and add in resistance training at home;Long Term: Improve cardiorespiratory fitness, muscular endurance and strength as measured by increased METs and functional capacity ( )      Able to understand and use rate of perceived exertion (RPE) scale Yes Yes      Intervention Provide education and explanation on how to use RPE scale Provide education and explanation on how to use RPE scale      Expected Outcomes Short Term: Able to use RPE daily in rehab to express subjective intensity level;Long Term:  Able to use RPE to guide intensity level when exercising independently Short Term: Able to use RPE daily in rehab to express subjective intensity level;Long Term:  Able to use RPE to guide intensity level when exercising independently      Able to understand and use Dyspnea scale Yes Yes      Intervention Provide education and explanation on how to use Dyspnea scale Provide education and explanation on how to use Dyspnea scale      Expected Outcomes Short Term: Able to use Dyspnea scale daily in rehab to express subjective sense of shortness of breath during exertion;Long Term: Able to use Dyspnea scale to guide intensity level when exercising independently Short Term: Able to use Dyspnea scale daily in rehab to express subjective sense of shortness of breath during exertion;Long Term: Able to use Dyspnea scale to guide intensity level when exercising independently      Knowledge and understanding of Target Heart Rate Range (THRR) Yes Yes      Intervention Provide education and explanation of THRR including how the numbers were predicted and where they are located for reference Provide education and explanation of THRR including how the numbers were  predicted and where they are located for reference      Expected Outcomes Short Term: Able to state/look up THRR;Short Term: Able to use daily as guideline for intensity in rehab;Long Term: Able to use THRR to govern intensity when exercising independently Short Term: Able to state/look up THRR;Short Term: Able to use daily as guideline for intensity in rehab;Long Term: Able to use THRR to govern intensity when exercising independently      Understanding of Exercise Prescription Yes Yes      Intervention Provide education, explanation, and written materials on patient's individual exercise prescription Provide education, explanation, and written materials on patient's individual exercise prescription      Expected Outcomes Short Term: Able to explain program exercise prescription;Long Term: Able to explain home exercise prescription to exercise independently Short Term: Able to explain program exercise prescription;Long Term: Able to explain home exercise prescription to exercise independently               Exercise Goals Re-Evaluation :  Exercise Goals Re-Evaluation     Row Name 01/15/23 0840             Exercise Goal Re-Evaluation   Exercise Goals Review Increase Physical Activity;Able to understand and use Dyspnea scale;Understanding of Exercise Prescription;Increase Strength and Stamina;Knowledge and understanding of Target Heart Rate Range (THRR);Able to understand and use rate of perceived exertion (RPE) scale       Comments Pt is scheduled to begin exercise this week. Will continue to  monitor and progress as able.       Expected Outcomes Through exercise at rehab and home, the patient will decrease shortness of breath with daily activities and feel confident in carrying out an exercise regimen at home.                Discharge Exercise Prescription (Final Exercise Prescription Changes):   Nutrition:  Target Goals: Understanding of nutrition guidelines, daily intake of  sodium 1500mg , cholesterol 200mg , calories 30% from fat and 7% or less from saturated fats, daily to have 5 or more servings of fruits and vegetables.  Biometrics:    Nutrition Therapy Plan and Nutrition Goals:   Nutrition Assessments:  MEDIFICTS Score Key: ?70 Need to make dietary changes  40-70 Heart Healthy Diet ? 40 Therapeutic Level Cholesterol Diet   Picture Your Plate Scores: <16 Unhealthy dietary pattern with much room for improvement. 41-50 Dietary pattern unlikely to meet recommendations for good health and room for improvement. 51-60 More healthful dietary pattern, with some room for improvement.  >60 Healthy dietary pattern, although there may be some specific behaviors that could be improved.    Nutrition Goals Re-Evaluation:   Nutrition Goals Discharge (Final Nutrition Goals Re-Evaluation):   Psychosocial: Target Goals: Acknowledge presence or absence of significant depression and/or stress, maximize coping skills, provide positive support system. Participant is able to verbalize types and ability to use techniques and skills needed for reducing stress and depression.  Initial Review & Psychosocial Screening:  Initial Psych Review & Screening - 01/14/23 1050       Initial Review   Current issues with Current Sleep Concerns      Family Dynamics   Good Support System? Yes    Comments Pt wears Cpap nightly. Can only tolerate for about 4 hours. Pt also stated he can only sleep on his back due to prior surgeries. Pt has sleeping meds he takes. He states that these do help.      Barriers   Psychosocial barriers to participate in program Psychosocial barriers identified (see note)      Screening Interventions   Interventions Encouraged to exercise    Expected Outcomes Long Term Goal: Stressors or current issues are controlled or eliminated.             Quality of Life Scores:  Scores of 19 and below usually indicate a poorer quality of life in these  areas.  A difference of  2-3 points is a clinically meaningful difference.  A difference of 2-3 points in the total score of the Quality of Life Index has been associated with significant improvement in overall quality of life, self-image, physical symptoms, and general health in studies assessing change in quality of life.  PHQ-9: Review Flowsheet  More data exists      01/14/2023 11/02/2022 06/06/2022 05/25/2022 04/02/2022  Depression screen PHQ 2/9  Decreased Interest 0 0 0 0 0  Down, Depressed, Hopeless 0 0 0 0 0  PHQ - 2 Score 0 0 0 0 0  Altered sleeping 1 - - - -  Tired, decreased energy 1 - - - -  Change in appetite 0 - - - -  Feeling bad or failure about yourself  0 - - - -  Trouble concentrating 0 - - - -  Moving slowly or fidgety/restless 0 - - - -  Suicidal thoughts 0 - - - -  PHQ-9 Score 2 - - - -  Difficult doing work/chores Not difficult at all - - - -  Interpretation of Total Score  Total Score Depression Severity:  1-4 = Minimal depression, 5-9 = Mild depression, 10-14 = Moderate depression, 15-19 = Moderately severe depression, 20-27 = Severe depression   Psychosocial Evaluation and Intervention:  Psychosocial Evaluation - 01/14/23 1059       Psychosocial Evaluation & Interventions   Interventions Encouraged to exercise with the program and follow exercise prescription    Comments Pt admits to trouble sleeping, but stated it's better than it use to be. He is currently working with his MD for medication needed.    Expected Outcomes For Thorn to attend PR without any psychosocial barriers or concerns.    Continue Psychosocial Services  No Follow up required             Psychosocial Re-Evaluation:  Psychosocial Re-Evaluation     Row Name 01/14/23 1249             Psychosocial Re-Evaluation   Current issues with Current Sleep Concerns       Comments No changes from orientation       Expected Outcomes For Doryan to attend PR without any psychosocial  barriers or concerns.       Interventions Encouraged to attend Pulmonary Rehabilitation for the exercise       Continue Psychosocial Services  No Follow up required                Psychosocial Discharge (Final Psychosocial Re-Evaluation):  Psychosocial Re-Evaluation - 01/14/23 1249       Psychosocial Re-Evaluation   Current issues with Current Sleep Concerns    Comments No changes from orientation    Expected Outcomes For Dhani to attend PR without any psychosocial barriers or concerns.    Interventions Encouraged to attend Pulmonary Rehabilitation for the exercise    Continue Psychosocial Services  No Follow up required             Education: Education Goals: Education classes will be provided on a weekly basis, covering required topics. Participant will state understanding/return demonstration of topics presented.  Learning Barriers/Preferences:   Education Topics: Introduction to Pulmonary Rehab Group instruction provided by PowerPoint, verbal discussion, and written material to support subject matter. Instructor reviews what Pulmonary Rehab is, the purpose of the program, and how patients are referred.     Know Your Numbers Group instruction that is supported by a PowerPoint presentation. Instructor discusses importance of knowing and understanding resting, exercise, and post-exercise oxygen saturation, heart rate, and blood pressure. Oxygen saturation, heart rate, blood pressure, rating of perceived exertion, and dyspnea are reviewed along with a normal range for these values.    Exercise for the Pulmonary Patient Group instruction that is supported by a PowerPoint presentation. Instructor discusses benefits of exercise, core components of exercise, frequency, duration, and intensity of an exercise routine, importance of utilizing pulse oximetry during exercise, safety while exercising, and options of places to exercise outside of rehab.       MET Level  Group  instruction provided by PowerPoint, verbal discussion, and written material to support subject matter. Instructor reviews what METs are and how to increase METs.    Pulmonary Medications Verbally interactive group education provided by instructor with focus on inhaled medications and proper administration.   Anatomy and Physiology of the Respiratory System Group instruction provided by PowerPoint, verbal discussion, and written material to support subject matter. Instructor reviews respiratory cycle and anatomical components of the respiratory system and their functions. Instructor also reviews differences in obstructive and  restrictive respiratory diseases with examples of each.    Oxygen Safety Group instruction provided by PowerPoint, verbal discussion, and written material to support subject matter. There is an overview of "What is Oxygen" and "Why do we need it".  Instructor also reviews how to create a safe environment for oxygen use, the importance of using oxygen as prescribed, and the risks of noncompliance. There is a brief discussion on traveling with oxygen and resources the patient may utilize.   Oxygen Use Group instruction provided by PowerPoint, verbal discussion, and written material to discuss how supplemental oxygen is prescribed and different types of oxygen supply systems. Resources for more information are provided.    Breathing Techniques Group instruction that is supported by demonstration and informational handouts. Instructor discusses the benefits of pursed lip and diaphragmatic breathing and detailed demonstration on how to perform both.     Risk Factor Reduction Group instruction that is supported by a PowerPoint presentation. Instructor discusses the definition of a risk factor, different risk factors for pulmonary disease, and how the heart and lungs work together.   MD Day A group question and answer session with a medical doctor that allows participants to  ask questions that relate to their pulmonary disease state.   Nutrition for the Pulmonary Patient Group instruction provided by PowerPoint slides, verbal discussion, and written materials to support subject matter. The instructor gives an explanation and review of healthy diet recommendations, which includes a discussion on weight management, recommendations for fruit and vegetable consumption, as well as protein, fluid, caffeine, fiber, sodium, sugar, and alcohol. Tips for eating when patients are short of breath are discussed.    Other Education Group or individual verbal, written, or video instructions that support the educational goals of the pulmonary rehab program.    Knowledge Questionnaire Score:  Knowledge Questionnaire Score - 01/14/23 1300       Knowledge Questionnaire Score   Pre Score 16/18             Core Components/Risk Factors/Patient Goals at Admission:  Personal Goals and Risk Factors at Admission - 01/14/23 1100       Core Components/Risk Factors/Patient Goals on Admission    Weight Management Weight Loss    Improve shortness of breath with ADL's Yes    Intervention Provide education, individualized exercise plan and daily activity instruction to help decrease symptoms of SOB with activities of daily living.    Expected Outcomes Short Term: Improve cardiorespiratory fitness to achieve a reduction of symptoms when performing ADLs             Core Components/Risk Factors/Patient Goals Review:   Goals and Risk Factor Review     Row Name 01/14/23 1250             Core Components/Risk Factors/Patient Goals Review   Personal Goals Review Weight Management/Obesity;Improve shortness of breath with ADL's;Develop more efficient breathing techniques such as purse lipped breathing and diaphragmatic breathing and practicing self-pacing with activity.       Review Unable to assess goals as patient has not started the program yet. He is scheduled to start on  01/22/2023       Expected Outcomes See Admission goals                Core Components/Risk Factors/Patient Goals at Discharge (Final Review):   Goals and Risk Factor Review - 01/14/23 1250       Core Components/Risk Factors/Patient Goals Review   Personal Goals Review Weight Management/Obesity;Improve shortness  of breath with ADL's;Develop more efficient breathing techniques such as purse lipped breathing and diaphragmatic breathing and practicing self-pacing with activity.    Review Unable to assess goals as patient has not started the program yet. He is scheduled to start on 01/22/2023    Expected Outcomes See Admission goals             ITP Comments:Pt is making expected progress toward Pulmonary Rehab goals after completing 0 sessions. Recommend continued exercise, life style modification, education, and utilization of breathing techniques to increase stamina and strength, while also decreasing shortness of breath with exertion.  Dr. Mechele CollinJane Ellison is Medical Director for Pulmonary Re[facility] at [facility].     Comments:

## 2023-01-17 ENCOUNTER — Encounter: Payer: Self-pay | Admitting: Cardiology

## 2023-01-17 ENCOUNTER — Ambulatory Visit: Payer: Medicare Other | Attending: Cardiology | Admitting: Cardiology

## 2023-01-17 VITALS — BP 118/65 | HR 95 | Ht 72.0 in | Wt 270.0 lb

## 2023-01-17 DIAGNOSIS — G4733 Obstructive sleep apnea (adult) (pediatric): Secondary | ICD-10-CM | POA: Diagnosis not present

## 2023-01-17 DIAGNOSIS — I10 Essential (primary) hypertension: Secondary | ICD-10-CM | POA: Diagnosis not present

## 2023-01-17 NOTE — Patient Instructions (Signed)
Medication Instructions:  Your physician recommends that you continue on your current medications as directed. Please refer to the Current Medication list given to you today.  *If you need a refill on your cardiac medications before your next appointment, please call your pharmacy*   Lab Work: None.  If you have labs (blood work) drawn today and your tests are completely normal, you will receive your results only by: MyChart Message (if you have MyChart) OR A paper copy in the mail If you have any lab test that is abnormal or we need to change your treatment, we will call you to review the results.   Testing/Procedures: None.   Follow-Up: At [facility], you and your health needs are our priority.  As part of our continuing mission to provide you with exceptional heart care, we have created designated Provider Care Teams.  These Care Teams include your primary Cardiologist (physician) and Advanced Practice Providers (APPs -  Physician Assistants and Nurse Practitioners) who all work together to provide you with the care you need, when you need it.  We recommend signing up for the patient portal called "MyChart".  Sign up information is provided on this After Visit Summary.  MyChart is used to connect with patients for Virtual Visits (Telemedicine).  Patients are able to view lab/test results, encounter notes, upcoming appointments, etc.  Non-urgent messages can be sent to your provider as well.   To learn more about what you can do with MyChart, go to [email or web address].    Your next appointment:   1 year(s)  Provider:   Dr. Traci Turner, MD   

## 2023-01-17 NOTE — Progress Notes (Signed)
SLEEP MEDICINE VIRTUAL CONSULT NOTE  via Video Note   Because of [name]'s co-morbid illnesses, he is at least at moderate risk for complications without adequate follow up.  This format is felt to be most appropriate for this patient at this time.  All issues noted in this document were discussed and addressed.  A limited physical exam was performed with this format.  Please refer to the patient's chart for his consent to telehealth for [name].      Date:  01/17/2023   ID:  Keith [name], DOB [birth year], MRN [id number] The patient was identified using 2 identifiers.  Patient Location: Home Provider Location: Home Office   PCP:  [place], [name], MD   [facility] Providers Cardiologist:  None     Evaluation Performed:  New Patient Evaluation  Chief Complaint:  OSA  History of Present Illness:    Keith [name] is a 72 y.o. male with a hx of chronic diastolic CHF, PHTN, TR, PE/DVT, HTN.  He underwent HST showing moderate OSA with an AHI of 21.4/hr and was started on auto CPAP from 4 to 15cm H2O.    He is doing well with his PAP device and thinks that he has gotten used to it.  He tolerates the full face mask and feels the pressure is adequate.  Since going on PAP he feels rested in the am and has no significant daytime sleepiness.  He denies any significant mouth or nasal dryness or nasal congestion.  He does not think that he snores.    Past Medical History:  Diagnosis Date   Allergy    Chronic back pain    Diabetes mellitus without complication    DNR (do not resuscitate) 11/19/2020   Duodenal ulcer hemorrhage 08/29/2014   ED (erectile dysfunction)    Esophageal stricture 08/30/2014   Glucose intolerance (impaired glucose tolerance)    Hiatal hernia 08/30/2014   Hypertension    Hypokalemia 04/11/2013   MRSA carrier 08/30/2014   Obesity    Osteoarthritis    Pneumonia    Pulmonary embolism 06/2020   Scoliosis 2016   Spinal  stenosis of lumbar region    Urinary tract infection 04/11/2013   Past Surgical History:  Procedure Laterality Date   BACK SURGERY  08/07/2021   COLONOSCOPY  2008   ESOPHAGOGASTRODUODENOSCOPY N/A 08/29/2014   Procedure: ESOPHAGOGASTRODUODENOSCOPY (EGD);  Surgeon: Hart Carwin, MD;  Location: [facility];  Service: Endoscopy;  Laterality: N/A;   LAMINECTOMY N/A 07/28/2018   Procedure: THORACIC ELEVEN- THORACIC TWELVE POSTERIOR DECOMPRESSION LAMINECTOMY;  Surgeon: Jadene Pierini, MD;  Location: [facility] OR;  Service: Neurosurgery;  Laterality: N/A;   LUMBAR LAMINECTOMY     NASAL POLYP SURGERY     ORIF ANKLE FRACTURE Right 07/01/2020   Procedure: OPEN REDUCTION INTERNAL FIXATION (ORIF) ANKLE FRACTURE. TRIMALLEOLAR;  Surgeon: Teryl Lucy, MD;  Location: [facility] OR;  Service: Orthopedics;  Laterality: Right;   RIGHT HEART CATH N/A 07/06/2022   Procedure: RIGHT HEART CATH;  Surgeon: Laurey Morale, MD;  Location: [facility] INVASIVE CV LAB;  Service: Cardiovascular;  Laterality: N/A;   RIGHT/LEFT HEART CATH AND CORONARY ANGIOGRAPHY N/A 03/02/2022   Procedure: RIGHT/LEFT HEART CATH AND CORONARY ANGIOGRAPHY;  Surgeon: Orpah Cobb, MD;  Location: [facility] INVASIVE CV LAB;  Service: Cardiovascular;  Laterality: N/A;   TONSILLECTOMY       Current Meds  Medication Sig   acetaminophen (TYLENOL) 325 MG tablet Take 1-2 tablets (325-650  mg total) by mouth every 4 (four) hours as needed for mild pain.   amLODipine (NORVASC) 10 MG tablet Take 1 tablet (10 mg total) by mouth daily.   apixaban (ELIQUIS) 5 MG TABS tablet Take 1 tablet (5 mg total) by mouth 2 (two) times daily.   Blood Glucose Monitoring Suppl (ACCU-CHEK GUIDE ME) w/Device KIT USE TO TEST BLOOD SUGARS 1-2 TIMES DAILY.   carvedilol (COREG) 12.5 MG tablet Take 1 tablet (12.5 mg total) by mouth 2 (two) times daily with a meal.   cyanocobalamin (VITAMIN B12) 1000 MCG tablet Take 1,000 mcg by mouth daily.   DULoxetine (CYMBALTA) 60 MG capsule TAKE 1 CAPSULE  BY MOUTH EVERY DAY   ferrous sulfate 325 (65 FE) MG EC tablet Take 325 mg by mouth every other day.   HYDROcodone-acetaminophen (NORCO) 7.5-325 MG tablet Take 1 tablet by mouth in the morning, at noon, in the evening, and at bedtime. 7.5 mg/375 mg   hydrocortisone (ANUSOL-HC) 2.5 % rectal cream Place 1 application. rectally as needed for hemorrhoids or anal itching.   levETIRAcetam (KEPPRA) 250 MG tablet Take 1 tablet (250 mg total) by mouth 2 (two) times daily.   lidocaine (LIDODERM) 5 % Place 1 patch onto the skin daily as needed (pain). Remove & Discard patch within 12 hours or as directed by MD   naloxone (NARCAN) nasal spray 4 mg/0.1 mL Place 1 spray into the nose as needed (accidental overdose).   omeprazole (PRILOSEC) 40 MG capsule Take 1 capsule (40 mg total) by mouth daily.   polyethylene glycol (MIRALAX / GLYCOLAX) 17 g packet Take 17 g by mouth daily as needed for mild constipation.   rOPINIRole (REQUIP) 1 MG tablet Take 1 tablet (1 mg total) by mouth at bedtime.   rosuvastatin (CRESTOR) 20 MG tablet Take 1 tablet (20 mg total) by mouth daily.   spironolactone (ALDACTONE) 25 MG tablet Take 1 tablet (25 mg total) by mouth daily.   tirzepatide [facility](MOUNJARO) 2.5 MG/0.5ML Pen Inject 2.5 mg into the skin once a week.   torsemide (DEMADEX) 20 MG tablet Take 1 tablet (20 mg total) by mouth daily.   traZODone (DESYREL) 50 MG tablet Take 0.5-1 tablets (25-50 mg total) by mouth at bedtime as needed. for sleep     Allergies:   Aspirin and Lisinopril   Social History   Tobacco Use   Smoking status: Former    Packs/day: 1.00    Years: 25.00    Additional pack years: 0.00    Total pack years: 25.00    Types: Cigarettes    Quit date: 821995    Years since quitting: 29.2    Passive exposure: Never   Smokeless tobacco: Never  Vaping Use   Vaping Use: Never used  Substance Use Topics   Alcohol use: Yes    Comment: holidays and special occasions   Drug use: Not Currently     Family  Hx: The patient's family history includes Asthma in his mother; Breast cancer in his mother; Diabetes in his mother; Hypertension in his father; Stroke in his father. There is no history of Colon cancer.  ROS:   Please see the history of present illness.     All other systems reviewed and are negative.   Prior Sleep studies:   The following studies were reviewed today:  HST, PAP compliance download  Labs/Other Tests and Data Reviewed:     Recent Labs: 10/23/2022: ALT 22 10/25/2022: Magnesium 2.4 11/02/2022: Hemoglobin 11.0; Platelets 412.0 12/24/2022: B  Natriuretic Peptide 16.9; BUN 21; Creatinine, Ser 1.36; Potassium 4.1; Sodium 139    Wt Readings from Last 3 Encounters:  01/17/23 270 lb (122.5 kg)  01/14/23 270 lb 8.1 oz (122.7 kg)  01/02/23 274 lb (124.3 kg)     Risk Assessment/Calculations:          Objective:    Vital Signs:  BP 118/65   Pulse 95   Ht 6' (1.829 m)   Wt 270 lb (122.5 kg)   BMI 36.62 kg/m   Well nourished, well developed male in no acute distress. Well appearing, alert and conversant, regular work of breathing,  good skin color  Eyes- anicteric mouth- oral mucosa is pink  neuro- grossly intact skin- no apparent rash or lesions or cyanosis ASSESSMENT & PLAN:    OSA - The patient is tolerating PAP therapy well without any problems. The patient has been using and benefiting from PAP use and will continue to benefit from therapy.  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     Medication Adjustments/Labs and Tests Ordered: Current medicines are reviewed at length with the patient today.  Concerns regarding medicines are outlined above.   Tests Ordered: No orders of the defined types were placed in this encounter.   Medication Changes: No orders of the defined types were placed in this encounter.   Follow Up:  In Person in 3 month(s)  Signed, Armanda Magic, MD  01/17/2023 9:22 AM     [facility]

## 2023-01-18 ENCOUNTER — Telehealth: Payer: Self-pay | Admitting: Pharmacist

## 2023-01-18 MED ORDER — MOUNJARO 5 MG/0.5ML ~~LOC~~ SOAJ: 5.0000 mg | SUBCUTANEOUS | 0 refills | Status: DC

## 2023-01-18 NOTE — Telephone Encounter (Signed)
Called pt to follow up with [facility]. Had a tiny bit of nausea when he first woke up after the first injection, has since resolved.  Tolerating well since then. Feeling full sooner and decreasing his portions. Ready to increase his dose - rx sent in for 5mg  weekly dose. Will call pt in another month for further dose titration.

## 2023-01-20 ENCOUNTER — Other Ambulatory Visit: Payer: Self-pay | Admitting: Cardiology

## 2023-01-21 ENCOUNTER — Other Ambulatory Visit: Payer: Self-pay | Admitting: Cardiology

## 2023-01-22 ENCOUNTER — Encounter (HOSPITAL_COMMUNITY)
Admission: RE | Admit: 2023-01-22 | Discharge: 2023-01-22 | Disposition: A | Discharge status: Home / Self Care | Payer: Medicare Other | Source: Ambulatory Visit | Attending: Cardiology | Admitting: Cardiology

## 2023-01-22 VITALS — Wt 267.2 lb

## 2023-01-22 DIAGNOSIS — I272 Pulmonary hypertension, unspecified: Secondary | ICD-10-CM

## 2023-01-22 NOTE — Progress Notes (Signed)
Daily Session Note  Patient Details  Name: ALLYN BERTONI MRN: [id number] Date of Birth: 1951/01/21 Referring Provider:   Doristine Devoid Pulmonary Rehab Walk Test from 01/14/2023 in [facility]  Referring Provider [name]       Encounter Date: 01/22/2023  Check In:  Session Check In - 01/22/23 1600       Check-In   Supervising physician immediately available to respond to emergencies [facility] MD immediately available    Physician(s) Jari Favre PA    Location [facility]    Staff Present Raford Pitcher, MS, ACSM-CEP, Exercise Physiologist;Kamya Watling Dionisio Paschal, ACSM-CEP, Exercise Physiologist;Mary Gerre Scull, RN, BSN;Casey Smith, Clayborn Bigness, MS, ACSM-CEP, Exercise Physiologist    Virtual Visit No    Medication changes reported     No    Fall or balance concerns reported    Yes    Comments Pt primarily uses wheelchair. Unstable with walker, gait belt used for transfers    Tobacco Cessation No Change    Warm-up and Cool-down Performed as group-led instruction    Resistance Training Performed Yes    VAD Patient? No    PAD/SET Patient? No      Pain Assessment   Currently in Pain? No/denies             Capillary Blood Glucose: No results found for this or any previous visit (from the past 24 hour(s)).   Exercise Prescription Changes - 01/22/23 1600       Response to Exercise   Blood Pressure (Admit) 100/60    Blood Pressure (Exercise) 126/70    Blood Pressure (Exit) 112/64    Heart Rate (Admit) 95 bpm    Heart Rate (Exercise) 112 bpm    Heart Rate (Exit) 99 bpm    Oxygen Saturation (Admit) 98 %    Oxygen Saturation (Exercise) 97 %    Oxygen Saturation (Exit) 98 %    Rating of Perceived Exertion (Exercise) 13    Perceived Dyspnea (Exercise) 1    Duration Progress to 30 minutes of  aerobic without signs/symptoms of physical distress    Intensity THRR unchanged      Progression   Progression  Continue to progress workloads to maintain intensity without signs/symptoms of physical distress.      Resistance Training   Training Prescription Yes    Weight red bands    Reps 10-15    Time 10 Minutes      T5 Nustep   Level 1    SPM 50    Minutes 30    METs 1.7             Social History   Tobacco Use  Smoking Status Former   Packs/day: 1.00   Years: 25.00   Additional pack years: 0.00   Total pack years: 25.00   Types: Cigarettes   Quit date: 7   Years since quitting: 29.3   Passive exposure: Never  Smokeless Tobacco Never    Goals Met:  Exercise tolerated well No report of concerns or symptoms today Strength training completed today  Goals Unmet:  Not Applicable  Comments: Service time is from 1024 to 1145.    Dr. Mechele Collin is Medical Director for Pulmonary Rehab at [facility].

## 2023-01-23 ENCOUNTER — Other Ambulatory Visit (HOSPITAL_COMMUNITY): Payer: Self-pay

## 2023-01-23 ENCOUNTER — Telehealth: Payer: Self-pay | Admitting: Family Medicine

## 2023-01-23 MED ORDER — MOUNJARO 2.5 MG/0.5ML ~~LOC~~ SOAJ
2.5000 mg | SUBCUTANEOUS | 0 refills | Status: DC
  Filled 2023-01-23: qty 2, 28d supply, fill #0

## 2023-01-23 MED ORDER — MOUNJARO 5 MG/0.5ML ~~LOC~~ SOAJ
5.0000 mg | SUBCUTANEOUS | 0 refills | Status: DC
  Filled 2023-01-23: qty 2, 28d supply, fill #0

## 2023-01-23 NOTE — Telephone Encounter (Signed)
[facility] can't get  dose in stock either, new rx sent for 2.5mg .

## 2023-01-23 NOTE — Telephone Encounter (Signed)
Pt states pharmacy can't get  dose in stock. [facility] pharmacy has 2.5mg  dose, refill sent in for this again.

## 2023-01-23 NOTE — Telephone Encounter (Signed)
Pt will look to see if different pharmacy can get Mounjaro  in stock and will let us know.

## 2023-01-23 NOTE — Telephone Encounter (Signed)
Pt called back, states [facility] pharmacy has 2.5mg  dose in stock, he did not ask about  dose. Advised him I will send in rx for  and he will let us know if pharmacy unable to get, can send in for an extra month of 2.5mg  if needed but prefer to titrate dose as planned if possible.

## 2023-01-23 NOTE — Telephone Encounter (Signed)
Contacted Keith [name] to schedule their annual wellness visit. Appointment made for 01/24/23.  Keith [name] AWV direct phone # [phone]

## 2023-01-23 NOTE — Addendum Note (Signed)
Addended by: Craven Crean E on: 01/23/2023 12:17 PM   Modules accepted: Orders

## 2023-01-24 ENCOUNTER — Encounter (HOSPITAL_COMMUNITY)
Admission: RE | Admit: 2023-01-24 | Discharge: 2023-01-24 | Disposition: A | Discharge status: Home / Self Care | Payer: Medicare Other | Source: Ambulatory Visit | Attending: Cardiology | Admitting: Cardiology

## 2023-01-24 ENCOUNTER — Encounter: Payer: Self-pay | Admitting: Family Medicine

## 2023-01-24 ENCOUNTER — Telehealth (INDEPENDENT_AMBULATORY_CARE_PROVIDER_SITE_OTHER): Payer: Medicare Other | Admitting: Family Medicine

## 2023-01-24 VITALS — BP 115/65

## 2023-01-24 DIAGNOSIS — I272 Pulmonary hypertension, unspecified: Secondary | ICD-10-CM

## 2023-01-24 DIAGNOSIS — M25551 Pain in right hip: Secondary | ICD-10-CM | POA: Diagnosis not present

## 2023-01-24 DIAGNOSIS — M549 Dorsalgia, unspecified: Secondary | ICD-10-CM | POA: Diagnosis not present

## 2023-01-24 DIAGNOSIS — M25571 Pain in right ankle and joints of right foot: Secondary | ICD-10-CM | POA: Diagnosis not present

## 2023-01-24 DIAGNOSIS — G8929 Other chronic pain: Secondary | ICD-10-CM | POA: Diagnosis not present

## 2023-01-24 DIAGNOSIS — Z9181 History of falling: Secondary | ICD-10-CM | POA: Diagnosis not present

## 2023-01-24 DIAGNOSIS — Z0000 Encounter for general adult medical examination without abnormal findings: Secondary | ICD-10-CM | POA: Diagnosis not present

## 2023-01-24 DIAGNOSIS — Z79899 Other long term (current) drug therapy: Secondary | ICD-10-CM | POA: Diagnosis not present

## 2023-01-24 NOTE — Patient Instructions (Signed)
I really enjoyed getting to talk with you today! I am available on Tuesdays and Thursdays for virtual visits if you have any questions or concerns, or if I can be of any further assistance.   CHECKLIST FROM ANNUAL WELLNESS VISIT:  -Follow up (please call to schedule if not scheduled after visit):   -yearly for annual wellness visit with primary care office  Here is a list of your preventive care/health maintenance measures and the plan for each if any are due:  PLAN For any measures below that may be due:  -you can get the vaccines at the pharmacy -please call to schedule your colonoscopy -can get the kidney check and the foot check with Dr. Thomasene Lot when you come in for a visit  Health Maintenance  Topic Date Due   OPHTHALMOLOGY EXAM  Never done   Zoster Vaccines- Shingrix (1 of 2) Never done   COVID-19 Vaccine (4 - 2023-24 season) 06/08/2022   Diabetic kidney evaluation - Urine ACR  01/18/2023   COLONOSCOPY (Pts 45-74yrs Insurance coverage will need to be confirmed)  01/21/2023   FOOT EXAM  01/18/2023   INFLUENZA VACCINE  05/09/2023   HEMOGLOBIN A1C  06/26/2023   DTaP/Tdap/Td (3 - Td or Tdap) 11/10/2023   Diabetic kidney evaluation - eGFR measurement  12/24/2023   Medicare Annual Wellness (AWV)  01/24/2024   Pneumonia Vaccine 59+ Years old  Completed   Hepatitis C Screening  Completed   HPV VACCINES  Aged Out    -See a dentist at least yearly  -Get your eyes checked and then per your eye specialist's recommendations  -Other issues addressed today:   -I have included below further information regarding a healthy whole foods based diet, physical activity guidelines for adults, stress management and opportunities for social connections. I hope you find this information useful.    -----------------------------------------------------------------------------------------------------------------------------------------------------------------------------------------------------------------------------------------------------------  NUTRITION: -eat real food: lots of colorful vegetables (half the plate) and fruits -5-7 servings of vegetables and fruits per day (fresh or steamed is best), exp. 2 servings of vegetables with lunch and dinner and 2 servings of fruit per day. Berries and greens such as kale and collards are great choices.  -consume on a regular basis: whole grains (make sure first ingredient on label contains the word "whole"), fresh fruits, fish, nuts, seeds, healthy oils (such as olive oil, avocado oil, grape seed oil) -may eat small amounts of dairy and lean meat on occasion, but avoid processed meats such as ham, bacon, lunch meat, etc. -drink water -try to avoid fast food and pre-packaged foods, processed meat -most experts advise limiting sodium to <  per day, should limit further is any chronic conditions such as high blood pressure, heart disease, diabetes, etc. The American Heart Association advised that <  is is ideal -try to avoid foods that contain any ingredients with names you do not recognize  -try to avoid sugar/sweets (except for the natural sugar that occurs in fresh fruit) -try to avoid sweet drinks -try to avoid white rice, white bread, pasta (unless whole grain), white or yellow potatoes  EXERCISE GUIDELINES FOR ADULTS: -if you wish to increase your physical activity, do so gradually and with the approval of your doctor -STOP and seek medical care immediately if you have any chest pain, chest discomfort or trouble breathing when starting or increasing exercise  -move and stretch your body, legs, feet and arms when sitting for long periods -Physical activity guidelines for optimal health in adults: -least 150 minutes per week of  aerobic exercise (can talk, but not sing) once approved by your doctor, 20-30 minutes of sustained activity or two 10 minute episodes of sustained activity every day.  -resistance training at least 2 days per week if approved by your doctor -balance exercises 3+ days per week:   Stand somewhere where you have something sturdy to hold onto if you lose balance.    1) lift up on toes, start with 5x per day and work up to 20x   2) stand and lift on leg straight out to the side so that foot is a few inches of the floor, start with 5x each side and work up to 20x each side   3) stand on one foot, start with 5 seconds each side and work up to 20 seconds on each side  If you need ideas or help with getting more active:  -Silver sneakers https://tools.silversneakers.com  -Walk with a Doc: http://stephens-thompson.biz/  -try to include resistance (weight lifting/strength building) and balance exercises twice per week: or the following link for ideas: ChessContest.fr  UpdateClothing.com.cy  STRESS MANAGEMENT: -can try meditating, or just sitting quietly with deep breathing while intentionally relaxing all parts of your body for 5 minutes daily -if you need further help with stress, anxiety or depression please follow up with your primary doctor or contact the wonderful folks at [employer]: [place]: -options in [place] if you wish to engage in more social and exercise related activities:  -Silver sneakers https://tools.silversneakers.com  -Walk with a Doc: http://stephens-thompson.biz/  -Check out the [place] 50+ section on the [place] [employer] (hiking clubs, book clubs, cards and games, chess, exercise classes, aquatic classes and much more) - see the website for  details: https://www.[place].gov/departments/parks-recreation/active-adults50  -YouTube has lots of exercise videos for different ages and abilities as well  -Christine (a variety of indoor and outdoor inperson activities for adults). [phone]. [address]  -Virtual Online Classes (a variety of topics): see seniorplanet.org or call [phone]  -consider volunteering at a school, hospice center, church, senior center or elsewhere

## 2023-01-24 NOTE — Progress Notes (Signed)
Daily Session Note  Patient Details  Name: Keith [name] MRN: [id number] Date of Birth: 05/31/1951 Referring Provider:   Doristine Devoid Pulmonary Rehab Walk Test from 01/14/2023 in [facility]  Referring Provider [name]       Encounter Date: 01/24/2023  Check In:  Session Check In - 01/24/23 1213       Check-In   Supervising physician immediately available to respond to emergencies [facility] MD immediately available    Physician(s) Eligha Bridegroom, NP    Location [facility]    Staff Present Samantha Belarus, RD, Dutch Gray, RN, Doris Cheadle, MS, ACSM-CEP, Exercise Physiologist;Adyen Bifulco Erin Sons BS, ACSM-CEP, Exercise Physiologist    Virtual Visit No    Medication changes reported     No    Fall or balance concerns reported    No    Tobacco Cessation No Change    Warm-up and Cool-down Performed as group-led instruction    Resistance Training Performed Yes    VAD Patient? No    PAD/SET Patient? No      Pain Assessment   Currently in Pain? No/denies    Multiple Pain Sites No             Capillary Blood Glucose: No results found for this or any previous visit (from the past 24 hour(s)).    Social History   Tobacco Use  Smoking Status Former   Packs/day: 1.00   Years: 25.00   Additional pack years: 0.00   Total pack years: 25.00   Types: Cigarettes   Quit date: 19   Years since quitting: 29.3   Passive exposure: Never  Smokeless Tobacco Never    Goals Met:  Proper associated with RPD/PD & O2 Sat Independence with exercise equipment Exercise tolerated well No report of concerns or symptoms today Strength training completed today  Goals Unmet:  Not Applicable  Comments: Service time is from 1025 to 1148.    Dr. Mechele Collin is Medical Director for Pulmonary Rehab at [facility].

## 2023-01-24 NOTE — Progress Notes (Signed)
PATIENT CHECK-IN and HEALTH RISK ASSESSMENT QUESTIONNAIRE:  -completed by phone/video for upcoming Medicare Preventive Visit  Pre-Visit Check-in: 1)Vitals (height, wt, BP, etc) - record in vitals section for visit on day of visit 2)Review and Update Medications, Allergies PMH, Surgeries, Social history in Epic 3)Hospitalizations in the last year with date/reason? 10/23/22 for RSV  4)Review and Update Care Team (patient's specialists) in Epic 5) Complete PHQ9 in Epic  6) Complete Fall Screening in Epic 7)Review all Health Maintenance Due and order under PCP if not done. Will call my eye doctor to get name of provider. Pt has received paperwork to schedule colonoscopy & plans to call within week to schedule.  Medicare Wellness Patient Questionnaire:  Answer theses question about your habits: Do you drink alcohol? Yes If yes, how many drinks do you have a day? socially Have you ever smoked? yes Quit date if applicable? 1995  How many packs a day do/did you smoke? 1 pack/day Do you use smokeless tobacco? N/a Do you use an illicit drugs? N/a Do you exercises? Yes IF so, what type and how many days/minutes per week? Goes to therapy; 12 week therapy course. Doing stretching exercises at home and also doing 2 days per week at home. Also practicing standing up and sitting down and transfers at home.  Are you sexually active? Yes Number of partners?1 Eating radically better after dx of CHF earlier this year - reports has successfully lost > 50lbs and was able to get off O2 and is feeling so much better Typical breakfast cereal; oatmeal; fruit Typical lunch Grilled cheese or cold cut sandwich Typical dinner protein, serving of veg; 1 serving carb Typical snacks: peanut butter/cheese crackers; low fat ice cream; fruit  Beverages: water  Answer theses question about you: Can you perform most household chores? No; "maybe 50%" Do you find it hard to follow a conversation in a noisy room? no Do you  often ask people to speak up or repeat themselves? no Do you feel that you have a problem with memory? no Do you balance your checkbook and or bank acounts? yes Do you feel safe at home? yes Last dentist visit? Almost 4 years - Do you need assistance with any of the following: Please note if so   Driving? no  Feeding yourself? no  Getting from bed to chair? No  Getting to the toilet? no  Bathing or showering? No  Dressing yourself? no  Managing money? no  Climbing a flight of stairs Yes; has a stair lift  Preparing meals? no    Do you have Advanced Directives in place (Living Will, Healthcare Power or Attorney)? Yes; pt will bring a copy at next OV   Last eye Exam and location? 2 months ago; [facility] on [address]   Do you currently use prescribed or non-prescribed narcotic or opioid pain medications? No  Do you have a history or close family history of breast, ovarian, tubal or peritoneal cancer or a family member with BRCA (breast cancer susceptibility 1 and 2) gene mutations? No  Nurse/Assistant Credentials/time stamp: Claudette Laws BSN, Editor, commissioning [facility] Clinical RN Supervisor    ----------------------------------------------------------------------------------------------------------------------------------------------------------------------------------------------------------------------    MEDICARE ANNUAL PREVENTIVE CARE VISIT WITH PROVIDER (Welcome to [facility], initial annual wellness or annual wellness exam)  Virtual Visit via Video Note  I connected with [name] on 01/24/23  by a video enabled telemedicine application and verified that I am speaking with the correct person using two identifiers.  Location patient: vehicle Location provider:work or home office Persons participating in the virtual visit: patient, provider, patient's wife for the end of the visit  Concerns and/or follow up today: reports is doing well - is  working with PT right now on strength and balance after inactivity following back surgery. He had to learn how to walk again and reports is doing really well - now able to work with the walker and get in the shower. Reports is doing much better than they thought he would.    See HM section in Epic for other details of completed HM.    ROS: negative for report of fevers, unintentional weight loss, vision changes, vision loss, hearing loss or change, chest pain, sob, hemoptysis, melena, hematochezia, hematuria, falls, bleeding or bruising, thoughts of suicide or self harm, memory loss  Patient-completed extensive health risk assessment - reviewed and discussed with the patient: See Health Risk Assessment completed with patient prior to the visit either above or in recent phone note. This was reviewed in detailed with the patient today and appropriate recommendations, orders and referrals were placed as needed per Summary below and patient instructions.   Review of Medical History: -PMH, PSH, Family History and current specialty and care providers reviewed and updated and listed below   Patient Care Team: [place], [name], MD as PCP - General (Family Medicine) Sherrill Raring, [facility] (Pharmacist)   Past Medical History:  Diagnosis Date   Allergy    Chronic back pain    Diabetes mellitus without complication    DNR (do not resuscitate) 11/19/2020   Duodenal ulcer hemorrhage 08/29/2014   ED (erectile dysfunction)    Esophageal stricture 08/30/2014   Glucose intolerance (impaired glucose tolerance)    Hiatal hernia 08/30/2014   Hypertension    Hypokalemia 04/11/2013   MRSA carrier 08/30/2014   Obesity    Osteoarthritis    Pneumonia    Pulmonary embolism 06/2020   Scoliosis 2016   Spinal stenosis of lumbar region    Urinary tract infection 04/11/2013    Past Surgical History:  Procedure Laterality Date   BACK SURGERY  08/07/2021   COLONOSCOPY  2008   ESOPHAGOGASTRODUODENOSCOPY N/A  08/29/2014   Procedure: ESOPHAGOGASTRODUODENOSCOPY (EGD);  Surgeon: Hart Carwin, MD;  Location: [facility];  Service: Endoscopy;  Laterality: N/A;   LAMINECTOMY N/A 07/28/2018   Procedure: THORACIC ELEVEN- THORACIC TWELVE POSTERIOR DECOMPRESSION LAMINECTOMY;  Surgeon: Jadene Pierini, MD;  Location: [facility] OR;  Service: Neurosurgery;  Laterality: N/A;   LUMBAR LAMINECTOMY     NASAL POLYP SURGERY     ORIF ANKLE FRACTURE Right 07/01/2020   Procedure: OPEN REDUCTION INTERNAL FIXATION (ORIF) ANKLE FRACTURE. TRIMALLEOLAR;  Surgeon: Teryl Lucy, MD;  Location: [facility] OR;  Service: Orthopedics;  Laterality: Right;   RIGHT HEART CATH N/A 07/06/2022   Procedure: RIGHT HEART CATH;  Surgeon: Laurey Morale, MD;  Location: [facility] INVASIVE CV LAB;  Service: Cardiovascular;  Laterality: N/A;   RIGHT/LEFT HEART CATH AND CORONARY ANGIOGRAPHY N/A 03/02/2022   Procedure: RIGHT/LEFT HEART CATH AND CORONARY ANGIOGRAPHY;  Surgeon: Orpah Cobb, MD;  Location: [facility] INVASIVE CV LAB;  Service: Cardiovascular;  Laterality: N/A;   TONSILLECTOMY      Social History   Socioeconomic History   Marital status: Married    Spouse name: Wynona Canes   Number of children: 5   Years of education: 4 years college   Highest education level: Not on file  Occupational History   Occupation: retired  Tobacco Use  Smoking status: Former    Packs/day: 1.00    Years: 25.00    Additional pack years: 0.00    Total pack years: 25.00    Types: Cigarettes    Quit date: 34    Years since quitting: 29.3    Passive exposure: Never   Smokeless tobacco: Never  Vaping Use   Vaping Use: Never used  Substance and Sexual Activity   Alcohol use: Yes    Comment: holidays and special occasions   Drug use: Not Currently   Sexual activity: Yes    Birth control/protection: None  Other Topics Concern   Not on file  Social History Narrative   Married, 5 kids, 14 grand, 2 great grand, watch TV, Armed forces operational officer, traveling   Social  Determinants of Health   Financial Resource Strain: Low Risk  (03/06/2022)   Overall Financial Resource Strain (CARDIA)    Difficulty of Paying Living Expenses: Not hard at all  Food Insecurity: No Food Insecurity (10/26/2022)   Hunger Vital Sign    Worried About Running Out of Food in the Last Year: Never true    Ran Out of Food in the Last Year: Never true  Transportation Needs: No Transportation Needs (10/26/2022)   PRAPARE - Administrator, Civil Service (Medical): No    Lack of Transportation (Non-Medical): No  Physical Activity: Sufficiently Active (01/09/2022)   Exercise Vital Sign    Days of Exercise per Week: 7 days    Minutes of Exercise per Session: 30 min  Stress: No Stress Concern Present (01/09/2022)   [employer] of Occupational Health - Occupational Stress Questionnaire    Feeling of Stress : Not at all  Social Connections: Moderately Integrated (01/03/2021)   Social Connection and Isolation Panel [NHANES]    Frequency of Communication with Friends and Family: Three times a week    Frequency of Social Gatherings with Friends and Family: Three times a week    Attends Religious Services: More than 4 times per year    Active Member of Clubs or Organizations: No    Attends Banker Meetings: Never    Marital Status: Married  Catering manager Violence: Not At Risk (10/23/2022)   Humiliation, Afraid, Rape, and Kick questionnaire    Fear of Current or Ex-Partner: No    Emotionally Abused: No    Physically Abused: No    Sexually Abused: No    Family History  Problem Relation Age of Onset   Diabetes Mother    Asthma Mother    Breast cancer Mother    Hypertension Father    Stroke Father    Colon cancer Neg Hx     Current Outpatient Medications on File Prior to Visit  Medication Sig Dispense Refill   acetaminophen (TYLENOL) 325 MG tablet Take 1-2 tablets (325-650 mg total) by mouth every 4 (four) hours as needed for mild pain.     albuterol  (VENTOLIN HFA) 108 (90 Base) MCG/ACT inhaler Inhale 2 puffs into the lungs every 6 (six) hours as needed for wheezing or shortness of breath. 8 g 1   amLODipine (NORVASC) 10 MG tablet Take 1 tablet (10 mg total) by mouth daily. 90 tablet 1   apixaban (ELIQUIS) 5 MG TABS tablet Take 1 tablet (5 mg total) by mouth 2 (two) times daily. 60 tablet 11   Blood Glucose Monitoring Suppl (ACCU-CHEK GUIDE ME) w/Device KIT USE TO TEST BLOOD SUGARS 1-2 TIMES DAILY. 1 kit 0   carvedilol (COREG)  12.5 MG tablet Take 1 tablet (12.5 mg total) by mouth 2 (two) times daily with a meal. 180 tablet 3   cyanocobalamin (VITAMIN B12) 1000 MCG tablet Take 1,000 mcg by mouth daily.     DULoxetine (CYMBALTA) 60 MG capsule TAKE 1 CAPSULE BY MOUTH EVERY DAY 90 capsule 0   ferrous sulfate 325 (65 FE) MG EC tablet Take 325 mg by mouth every other day.     gabapentin (NEURONTIN) 400 MG capsule Take 400 mg by mouth 3 (three) times daily.     HYDROcodone-acetaminophen (NORCO) 7.5-325 MG tablet Take 1 tablet by mouth in the morning, at noon, in the evening, and at bedtime. 7.5 mg/375 mg     hydrocortisone (ANUSOL-HC) 2.5 % rectal cream Place 1 application. rectally as needed for hemorrhoids or anal itching.     levETIRAcetam (KEPPRA) 250 MG tablet Take 1 tablet (250 mg total) by mouth 2 (two) times daily. 180 tablet 0   lidocaine (LIDODERM) 5 % Place 1 patch onto the skin daily as needed (pain). Remove & Discard patch within 12 hours or as directed by MD     naloxone (NARCAN) nasal spray 4 mg/0.1 mL Place 1 spray into the nose as needed (accidental overdose).     omeprazole (PRILOSEC) 40 MG capsule Take 1 capsule (40 mg total) by mouth daily. 90 capsule 1   polyethylene glycol (MIRALAX / GLYCOLAX) 17 g packet Take 17 g by mouth daily as needed for mild constipation.     rOPINIRole (REQUIP) 1 MG tablet Take 1 tablet (1 mg total) by mouth at bedtime. 90 tablet 2   rosuvastatin (CRESTOR) 20 MG tablet Take 1 tablet (20 mg total) by mouth  daily. 90 tablet 3   spironolactone (ALDACTONE) 25 MG tablet Take 1 tablet (25 mg total) by mouth daily. 90 tablet 3   tirzepatide (MOUNJARO) 2.5 MG/0.5ML Pen Inject 2.5 mg into the skin once a week. 2 mL 0   torsemide (DEMADEX) 20 MG tablet Take 1 tablet (20 mg total) by mouth daily. 90 tablet 3   traZODone (DESYREL) 50 MG tablet Take 0.5-1 tablets (25-50 mg total) by mouth at bedtime as needed. for sleep 90 tablet 0   Riociguat (ADEMPAS) 2 MG TABS Take 1 tablet by mouth. In the morning, at noon, and at bedtime.     No current facility-administered medications on file prior to visit.    Allergies  Allergen Reactions   Aspirin Other (See Comments)    Irritates the stomach and the patient developed ulcers, also   Lisinopril Other (See Comments)    Caused a body ache       Physical Exam Vitals:   01/24/23 1236  BP: 115/65   Estimated body mass index is 36.24 kg/m as calculated from the following:   Height as of 01/17/23: 6' (1.829 m).   Weight as of 01/22/23: 267 lb 3.2 oz (121.2 kg).  EKG (optional): deferred due to virtual visit  GENERAL: alert, oriented, no acute distress detected; full vision exam deferred due to pandemic and/or virtual encounter  HEENT: atraumatic, conjunttiva clear, no obvious abnormalities on inspection of external nose and ears  NECK: normal movements of the head and neck  LUNGS: on inspection no signs of respiratory distress, breathing rate appears normal, no obvious gross SOB, gasping or wheezing  CV: no obvious cyanosis  MS: moves all visible extremities without noticeable abnormality  PSYCH/NEURO: pleasant and cooperative, no obvious depression or anxiety, speech and thought processing grossly intact, Cognitive  function grossly intact  Flowsheet Row Pulmonary Rehab Walk Test from 01/14/2023 in [facility]  PHQ-9 Total Score 2           01/14/2023   12:45 PM 11/02/2022    1:54 PM  06/06/2022   11:02 AM 05/25/2022   11:39 AM 04/02/2022   11:25 AM  Depression screen PHQ 2/9  Decreased Interest 0 0 0 0 0  Down, Depressed, Hopeless 0 0 0 0 0  PHQ - 2 Score 0 0 0 0 0  Altered sleeping 1      Tired, decreased energy 1      Change in appetite 0      Feeling bad or failure about yourself  0      Trouble concentrating 0      Moving slowly or fidgety/restless 0      Suicidal thoughts 0      PHQ-9 Score 2      Difficult doing work/chores Not difficult at all           04/02/2022   11:25 AM 06/06/2022   11:02 AM 01/14/2023   11:04 AM 01/22/2023    4:09 PM 01/24/2023   12:39 PM  Fall Risk  Falls in the past year? 1 0 1 1 0  Was there an injury with Fall? 0 0 0 0 0  Fall Risk Category Calculator 2 0 2 2 0  Fall Risk Category (Retired) Moderate Low     (RETIRED) Patient Fall Risk Level Moderate fall risk Moderate fall risk     Patient at Risk for Falls Due to Impaired balance/gait History of fall(s) History of fall(s);Impaired balance/gait;Impaired mobility;Orthopedic patient History of fall(s);Impaired balance/gait;Impaired mobility;Orthopedic patient No Fall Risks  Fall risk Follow up Falls evaluation completed Falls evaluation completed Falls evaluation completed;Education provided;Falls prevention discussed Falls evaluation completed;Education provided;Falls prevention discussed Falls evaluation completed     SUMMARY AND PLAN:  Encounter for Medicare annual wellness exam   Discussed applicable health maintenance/preventive health measures and advised and referred or ordered per patient preferences:  Health Maintenance  Topic Date Due   OPHTHALMOLOGY EXAM  Staff is requesting copy to update   Zoster Vaccines- Shingrix (1 of 2) Never done - discussed, he knows can get at the pharmacy   COVID-19 Vaccine (4 - 2023-24 season) 06/08/2022, discussed, he knows can get at the pharmacy   Diabetic kidney evaluation - Urine ACR  01/18/2023, can do with in office exam    COLONOSCOPY (Pts 45-27yrs Insurance coverage will need to be confirmed)  01/21/2023 - he received reminder letter and reports he will call to schedule   FOOT EXAM  01/18/2023, advised and he plans to at in office visit   INFLUENZA VACCINE  05/09/2023   HEMOGLOBIN A1C  06/26/2023   DTaP/Tdap/Td (3 - Td or Tdap) 11/10/2023   Diabetic kidney evaluation - eGFR measurement  12/24/2023   Medicare Annual Wellness (AWV)  01/24/2024   Pneumonia Vaccine 64+ Years old  Completed   Hepatitis C Screening  Completed   HPV VACCINES  Aged [employer] and counseling on the following was provided based on the above review of health and a plan/checklist for the patient, along with additional information discussed, was provided for the patient in the patient instructions :  -Agrees to bring copy of advanced directives to PCP -Provided counseling and plan for increased risk of falling if applicable per above screening. Reviewed and demonstrated  safe balance exercises that can be done at home to improve balance and discussed exercise guidelines for adults with include balance exercises at least 3 days per week.  -Advised and counseled on a healthy lifestyle - including the importance of a healthy diet, regular physical activity -information on social connections and stress management provided in patient instructions -Reviewed patient's current diet. Advised and counseled on a whole foods based healthy diet. A summary of a healthy diet was provided in the Patient Instructions. He has made remarkable changes an progress! Congratulated and supported.  -reviewed patient's current physical activity level and discussed exercise guidelines for adults. Discussed ideas for safe exercise at home to assist in meeting exercise guideline recommendations in a safe and healthy way. He is currently working with physical therapy - discussed some chair exercises for arm strength. -Advise yearly dental visits at minimum and regular  eye exams   Follow up: see patient instructions   Patient Instructions  I really enjoyed getting to talk with you today! I am available on Tuesdays and Thursdays for virtual visits if you have any questions or concerns, or if I can be of any further assistance.   CHECKLIST FROM ANNUAL WELLNESS VISIT:  -Follow up (please call to schedule if not scheduled after visit):   -yearly for annual wellness visit with primary care office  Here is a list of your preventive care/health maintenance measures and the plan for each if any are due:  PLAN For any measures below that may be due:  -you can get the vaccines at the pharmacy -please call to schedule your colonoscopy -can get the kidney check and the foot check with Dr. Thomasene Lot when you come in for a visit  Health Maintenance  Topic Date Due   OPHTHALMOLOGY EXAM  Never done   Zoster Vaccines- Shingrix (1 of 2) Never done   COVID-19 Vaccine (4 - 2023-24 season) 06/08/2022   Diabetic kidney evaluation - Urine ACR  01/18/2023   COLONOSCOPY (Pts 45-39yrs Insurance coverage will need to be confirmed)  01/21/2023   FOOT EXAM  01/18/2023   INFLUENZA VACCINE  05/09/2023   HEMOGLOBIN A1C  06/26/2023   DTaP/Tdap/Td (3 - Td or Tdap) 11/10/2023   Diabetic kidney evaluation - eGFR measurement  12/24/2023   Medicare Annual Wellness (AWV)  01/24/2024   Pneumonia Vaccine 109+ Years old  Completed   Hepatitis C Screening  Completed   HPV VACCINES  Aged Out    -See a dentist at least yearly  -Get your eyes checked and then per your eye specialist's recommendations  -Other issues addressed today:   -I have included below further information regarding a healthy whole foods based diet, physical activity guidelines for adults, stress management and opportunities for social connections. I hope you find this information useful.    -----------------------------------------------------------------------------------------------------------------------------------------------------------------------------------------------------------------------------------------------------------  NUTRITION: -eat real food: lots of colorful vegetables (half the plate) and fruits -5-7 servings of vegetables and fruits per day (fresh or steamed is best), exp. 2 servings of vegetables with lunch and dinner and 2 servings of fruit per day. Berries and greens such as kale and collards are great choices.  -consume on a regular basis: whole grains (make sure first ingredient on label contains the word "whole"), fresh fruits, fish, nuts, seeds, healthy oils (such as olive oil, avocado oil, grape seed oil) -may eat small amounts of dairy and lean meat on occasion, but avoid processed meats such as ham, bacon, lunch meat, etc. -drink water -try to avoid fast food  and pre-packaged foods, processed meat -most experts advise limiting sodium to < 2300mg  per day, should limit further is any chronic conditions such as high blood pressure, heart disease, diabetes, etc. The American Heart Association advised that < 1500mg  is is ideal -try to avoid foods that contain any ingredients with names you do not recognize  -try to avoid sugar/sweets (except for the natural sugar that occurs in fresh fruit) -try to avoid sweet drinks -try to avoid white rice, white bread, pasta (unless whole grain), white or yellow potatoes  EXERCISE GUIDELINES FOR ADULTS: -if you wish to increase your physical activity, do so gradually and with the approval of your doctor -STOP and seek medical care immediately if you have any chest pain, chest discomfort or trouble breathing when starting or increasing exercise  -move and stretch your body, legs, feet and arms when sitting for long periods -Physical activity guidelines for optimal health in adults: -least 150 minutes per week of  aerobic exercise (can talk, but not sing) once approved by your doctor, 20-30 minutes of sustained activity or two 10 minute episodes of sustained activity every day.  -resistance training at least 2 days per week if approved by your doctor -balance exercises 3+ days per week:   Stand somewhere where you have something sturdy to hold onto if you lose balance.    1) lift up on toes, start with 5x per day and work up to 20x   2) stand and lift on leg straight out to the side so that foot is a few inches of the floor, start with 5x each side and work up to 20x each side   3) stand on one foot, start with 5 seconds each side and work up to 20 seconds on each side  If you need ideas or help with getting more active:  -Silver sneakers https://tools.silversneakers.com  -Walk with a Doc: http://www.duncan-williams.com/  -try to include resistance (weight lifting/strength building) and balance exercises twice per week: or the following link for ideas: http://castillo-powell.com/  BuyDucts.dk  STRESS MANAGEMENT: -can try meditating, or just sitting quietly with deep breathing while intentionally relaxing all parts of your body for 5 minutes daily -if you need further help with stress, anxiety or depression please follow up with your primary doctor or contact the wonderful folks at [facility]: [phone]  SOCIAL CONNECTIONS: -options in [place] if you wish to engage in more social and exercise related activities:  -Silver sneakers https://tools.silversneakers.com  -Walk with a Doc: http://www.duncan-williams.com/  -Check out the [facility] Active Adults 50+ section on the [place] of [employer] (hiking clubs, book clubs, cards and games, chess, exercise classes, aquatic classes and much more) - see the website for  details: https://www.[place].gov/departments/parks-recreation/active-adults50  -YouTube has lots of exercise videos for different ages and abilities as well  -[facility] (a variety of indoor and outdoor inperson activities for adults). [phone]. [address].  -Virtual Online Classes (a variety of topics): see seniorplanet.org or call [phone]  -consider volunteering at a school, hospice center, church, senior center or elsewhere           Terressa Koyanagi, DO

## 2023-01-26 ENCOUNTER — Other Ambulatory Visit: Payer: Self-pay | Admitting: Physical Medicine and Rehabilitation

## 2023-01-28 NOTE — Telephone Encounter (Signed)
I cannot refill since I have not seen him- he will need to get from PCP- thanks- ML

## 2023-01-29 ENCOUNTER — Encounter (HOSPITAL_COMMUNITY)
Admission: RE | Admit: 2023-01-29 | Discharge: 2023-01-29 | Disposition: A | Discharge status: Home / Self Care | Payer: Medicare Other | Source: Ambulatory Visit | Attending: Cardiology | Admitting: Cardiology

## 2023-01-29 ENCOUNTER — Other Ambulatory Visit (HOSPITAL_COMMUNITY): Payer: Self-pay | Admitting: Cardiology

## 2023-01-29 ENCOUNTER — Encounter: Payer: Self-pay | Admitting: Physician Assistant

## 2023-01-29 DIAGNOSIS — I272 Pulmonary hypertension, unspecified: Secondary | ICD-10-CM

## 2023-01-29 NOTE — Progress Notes (Signed)
Daily Session Note  Patient Details  Name: Keith [name] MRN: [id number] Date of Birth: January 16, 1951 Referring Provider:   Doristine Devoid Pulmonary Rehab Walk Test from 01/14/2023 in [facility] for Heart, Vascular, & Lung Health  Referring Provider [name]       Encounter Date: 01/29/2023  Check In:  Session Check In - 01/29/23 1211       Check-In   Supervising physician immediately available to respond to emergencies [facility] MD immediately available    Physician(s) Eligha Bridegroom, NP    Location [facility]    Staff Present Essie Hart, RN, BSN;Randi Idelle Crouch BS, ACSM-CEP, Exercise Physiologist;Samantha Belarus, RD, Rexene Agent, MS, ACSM-CEP, Exercise Physiologist;Kalei Mckillop Katrinka Blazing, RT    Virtual Visit No    Medication changes reported     No    Fall or balance concerns reported    No    Tobacco Cessation No Change    Warm-up and Cool-down Performed as group-led instruction    Resistance Training Performed Yes    VAD Patient? No    PAD/SET Patient? No      Pain Assessment   Currently in Pain? No/denies    Multiple Pain Sites No             Capillary Blood Glucose: No results found for this or any previous visit (from the past 24 hour(s)).    Social History   Tobacco Use  Smoking Status Former   Packs/day: 1.00   Years: 25.00   Additional pack years: 0.00   Total pack years: 25.00   Types: Cigarettes   Quit date: 18   Years since quitting: 29.3   Passive exposure: Never  Smokeless Tobacco Never    Goals Met:  Proper associated with RPD/PD & O2 Sat Independence with exercise equipment Exercise tolerated well No report of concerns or symptoms today Strength training completed today  Goals Unmet:  Not Applicable  Comments: Service time is from 1015 to 1150.    Dr. Mechele Collin is Medical Director for Pulmonary Rehab at [facility].

## 2023-01-31 ENCOUNTER — Encounter (HOSPITAL_COMMUNITY)
Admission: RE | Admit: 2023-01-31 | Discharge: 2023-01-31 | Disposition: A | Discharge status: Home / Self Care | Payer: Medicare Other | Source: Ambulatory Visit | Attending: Cardiology | Admitting: Cardiology

## 2023-01-31 DIAGNOSIS — I272 Pulmonary hypertension, unspecified: Secondary | ICD-10-CM

## 2023-01-31 NOTE — Progress Notes (Signed)
Daily Session Note  Patient Details  Name: Keith [name] MRN: [id number] Date of Birth: [birth year] Referring Provider:   Doristine Devoid Pulmonary Rehab Walk Test from 01/14/2023 in [facility] for Heart, Vascular, & Lung Health  Referring Provider [name]       Encounter Date: 01/31/2023  Check In:  Session Check In - 01/31/23 1228       Check-In   Supervising physician immediately available to respond to emergencies [facility] immediately available    Physician(s) Bernadene Person, NP    [facility]    Staff Present Essie Hart, RN, BSN;Randi Idelle Crouch BS, ACSM-CEP, Exercise Physiologist;Samantha Belarus, [place], Rexene Agent, MS, ACSM-CEP, Exercise Physiologist;Bentley Haralson Katrinka Blazing, RT    Virtual Visit No    Medication changes reported     No    Fall or balance concerns reported    No    Tobacco Cessation No Change    Warm-up and Cool-down Performed as group-led instruction    Resistance Training Performed Yes    VAD Patient? No    PAD/SET Patient? No      Pain Assessment   Currently in Pain? No/denies    Pain Score 0-No pain    Multiple Pain Sites No             Capillary Blood Glucose: No results found for this or any previous visit (from the past 24 hour(s)).    Social History   Tobacco Use  Smoking Status Former   Packs/day: 1.00   Years: 25.00   Additional pack years: 0.00   Total pack years: 25.00   Types: Cigarettes   Quit date: 18   Years since quitting: 29.3   Passive exposure: Never  Smokeless Tobacco Never    Goals Met:  Proper associated with RPD/PD & O2 Sat Independence with exercise equipment Exercise tolerated well No report of concerns or symptoms today Strength training completed today  Goals Unmet:  Not Applicable  Comments: Service time is from 1020 to 1155.    Dr. Mechele Collin is Medical Director for Pulmonary Rehab at [facility].

## 2023-02-03 ENCOUNTER — Other Ambulatory Visit: Payer: Self-pay | Admitting: Family Medicine

## 2023-02-03 DIAGNOSIS — I10 Essential (primary) hypertension: Secondary | ICD-10-CM

## 2023-02-05 ENCOUNTER — Encounter (HOSPITAL_COMMUNITY)
Admission: RE | Admit: 2023-02-05 | Discharge: 2023-02-05 | Disposition: A | Discharge status: Home / Self Care | Payer: Medicare Other | Source: Ambulatory Visit | Attending: Cardiology | Admitting: Cardiology

## 2023-02-05 VITALS — Wt 267.2 lb

## 2023-02-05 DIAGNOSIS — I272 Pulmonary hypertension, unspecified: Secondary | ICD-10-CM

## 2023-02-05 NOTE — Progress Notes (Signed)
Daily Session Note  Patient Details  Name: Keith [name] MRN: [id number] Date of Birth: Feb 06, 1951 Referring Provider:   Doristine Devoid Pulmonary Rehab Walk Test from 01/14/2023 in [facility] for Heart, Vascular, & Lung Health  Referring Provider [name]       Encounter Date: 02/05/2023  Check In:  Session Check In - 02/05/23 1157       Check-In   Supervising physician immediately available to respond to emergencies [facility] immediately available    Physician(s) Robin Searing, NP    [facility]    Staff Present Essie Hart, RN, BSN;Randi Idelle Crouch BS, ACSM-CEP, Exercise Physiologist;Samantha Belarus, [place], Rexene Agent, MS, ACSM-CEP, Exercise Physiologist;Casey Katrinka Blazing, RT    Virtual Visit No    Medication changes reported     No    Fall or balance concerns reported    No    Tobacco Cessation No Change    Warm-up and Cool-down Performed as group-led instruction    Resistance Training Performed Yes    VAD Patient? No    PAD/SET Patient? No      Pain Assessment   Currently in Pain? No/denies    Pain Score 0-No pain    Multiple Pain Sites No             Capillary Blood Glucose: No results found for this or any previous visit (from the past 24 hour(s)).   Exercise Prescription Changes - 02/05/23 1200       Response to Exercise   Blood Pressure (Admit) 108/56    Blood Pressure (Exercise) 142/72    Blood Pressure (Exit) 100/60    Heart Rate (Admit) 96 bpm    Heart Rate (Exercise) 111 bpm    Heart Rate (Exit) 104 bpm    Oxygen Saturation (Admit) 98 %    Oxygen Saturation (Exercise) 94 %    Oxygen Saturation (Exit) 98 %    Rating of Perceived Exertion (Exercise) 10    Perceived Dyspnea (Exercise) 1    Duration Progress to 30 minutes of  aerobic without signs/symptoms of physical distress    Intensity THRR unchanged      Progression   Progression Continue to progress workloads to maintain intensity without signs/symptoms  of physical distress.      Resistance Training   Training Prescription Yes    Weight red bands    Reps 10-15    Time 10 Minutes      T5 Nustep   Level 2    SPM 60    Minutes 30    METs 1.9             Social History   Tobacco Use  Smoking Status Former   Packs/day: 1.00   Years: 25.00   Additional pack years: 0.00   Total pack years: 25.00   Types: Cigarettes   Quit date: 58   Years since quitting: 29.3   Passive exposure: Never  Smokeless Tobacco Never    Goals Met:  Proper associated with RPD/PD & O2 Sat Exercise tolerated well No report of concerns or symptoms today Strength training completed today  Goals Unmet:  Not Applicable  Comments: Service time is from 1019 to 1139.    Dr. Mechele Collin is Medical Director for Pulmonary Rehab at [facility].

## 2023-02-07 ENCOUNTER — Encounter (HOSPITAL_COMMUNITY)
Admission: RE | Admit: 2023-02-07 | Discharge: 2023-02-07 | Disposition: A | Discharge status: Home / Self Care | Payer: Medicare Other | Source: Ambulatory Visit | Attending: Cardiology | Admitting: Cardiology

## 2023-02-07 DIAGNOSIS — I272 Pulmonary hypertension, unspecified: Secondary | ICD-10-CM | POA: Insufficient documentation

## 2023-02-07 NOTE — Progress Notes (Signed)
Daily Session Note  Patient Details  Name: Keith [name] MRN: [id number] Date of Birth: Jun 14, 1951 Referring Provider:   Doristine Devoid Pulmonary Rehab Walk Test from 01/14/2023 in [facility] for Heart, Vascular, & Lung Health  Referring Provider [name]       Encounter Date: 02/07/2023  Check In:  Session Check In - 02/07/23 1136       Check-In   Supervising physician immediately available to respond to emergencies [facility] immediately available    Physician(s) Edd Fabian, NP    [facility]    Staff Present Essie Hart, RN, BSN;Randi Idelle Crouch BS, ACSM-CEP, Exercise Physiologist;Kaylee Earlene Plater, MS, ACSM-CEP, Exercise Physiologist;Kainon Varady Katrinka Blazing, RT    Virtual Visit No    Medication changes reported     No    Fall or balance concerns reported    No    Tobacco Cessation No Change    Warm-up and Cool-down Performed as group-led instruction    Resistance Training Performed Yes    VAD Patient? No      Pain Assessment   Currently in Pain? No/denies    Pain Score 0-No pain    Multiple Pain Sites No             Capillary Blood Glucose: No results found for this or any previous visit (from the past 24 hour(s)).    Social History   Tobacco Use  Smoking Status Former   Packs/day: 1.00   Years: 25.00   Additional pack years: 0.00   Total pack years: 25.00   Types: Cigarettes   Quit date: 61   Years since quitting: 29.3   Passive exposure: Never  Smokeless Tobacco Never    Goals Met:  Proper associated with RPD/PD & O2 Sat Independence with exercise equipment Exercise tolerated well No report of concerns or symptoms today Strength training completed today  Goals Unmet:  Not Applicable  Comments: Service time is from 1013 to 1150.    Dr. Mechele Collin is Medical Director for Pulmonary Rehab at [facility].

## 2023-02-12 ENCOUNTER — Encounter (HOSPITAL_COMMUNITY)
Admission: RE | Admit: 2023-02-12 | Discharge: 2023-02-12 | Disposition: A | Discharge status: Home / Self Care | Payer: Medicare Other | Source: Ambulatory Visit | Attending: Cardiology | Admitting: Cardiology

## 2023-02-12 DIAGNOSIS — I272 Pulmonary hypertension, unspecified: Secondary | ICD-10-CM

## 2023-02-12 NOTE — Progress Notes (Signed)
Daily Session Note  Patient Details  Name: Keith [name] MRN: [id number] Date of Birth: 1950/10/21 Referring Provider:   Doristine Devoid Pulmonary Rehab Walk Test from 01/14/2023 in [facility] for Heart, Vascular, & Lung Health  Referring Provider [name]       Encounter Date: 02/12/2023  Check In:  Session Check In - 02/12/23 1228       Check-In   Supervising physician immediately available to respond to emergencies [facility] immediately available    Physician(s) Carlos Levering, NP    [facility]    Staff Present Essie Hart, RN, BSN;Randi Idelle Crouch BS, ACSM-CEP, Exercise Physiologist;Kyen Taite Earlene Plater, MS, ACSM-CEP, Exercise Physiologist;Casey Marcille Buffy, RN, BSN    Virtual Visit No    Medication changes reported     No    Fall or balance concerns reported    No    Tobacco Cessation No Change    Warm-up and Cool-down Performed as group-led instruction    Resistance Training Performed Yes    VAD Patient? No    PAD/SET Patient? No      Pain Assessment   Currently in Pain? No/denies    Pain Score 0-No pain    Multiple Pain Sites No             Capillary Blood Glucose: No results found for this or any previous visit (from the past 24 hour(s)).    Social History   Tobacco Use  Smoking Status Former   Packs/day: 1.00   Years: 25.00   Additional pack years: 0.00   Total pack years: 25.00   Types: Cigarettes   Quit date: 30   Years since quitting: 29.3   Passive exposure: Never  Smokeless Tobacco Never    Goals Met:  Proper associated with RPD/PD & O2 Sat Exercise tolerated well No report of concerns or symptoms today Strength training completed today  Goals Unmet:  Not Applicable  Comments: Service time is from 1027 to 1145.    Dr. Mechele Collin is Medical Director for Pulmonary Rehab at [facility].

## 2023-02-14 ENCOUNTER — Encounter (HOSPITAL_COMMUNITY)
Admission: RE | Admit: 2023-02-14 | Discharge: 2023-02-14 | Disposition: A | Discharge status: Home / Self Care | Payer: Medicare Other | Source: Ambulatory Visit | Attending: Cardiology | Admitting: Cardiology

## 2023-02-14 DIAGNOSIS — I272 Pulmonary hypertension, unspecified: Secondary | ICD-10-CM | POA: Diagnosis not present

## 2023-02-14 NOTE — Progress Notes (Signed)
Daily Session Note  Patient Details  Name: Keith [name] MRN: [id number] Date of Birth: 1951-03-26 Referring Provider:   Doristine Devoid Pulmonary Rehab Walk Test from 01/14/2023 in [facility] for Heart, Vascular, & Lung Health  Referring Provider [name]       Encounter Date: 02/14/2023  Check In:  Session Check In - 02/14/23 1136       Check-In   Supervising physician immediately available to respond to emergencies [facility] MD immediately available    Physician(s) Jari Favre, PA    [facility]    Staff Present Essie Hart, RN, BSN;Randi Idelle Crouch BS, ACSM-CEP, Exercise Physiologist;Kaylee Earlene Plater, MS, ACSM-CEP, Exercise Physiologist;Deneice Wack Heloise Ochoa, MS, ACSM-CEP, CCRP, Exercise Physiologist    Virtual Visit No    Medication changes reported     No    Fall or balance concerns reported    No    Tobacco Cessation No Change    Warm-up and Cool-down Performed as group-led instruction    Resistance Training Performed Yes    VAD Patient? No    PAD/SET Patient? No      Pain Assessment   Currently in Pain? No/denies    Multiple Pain Sites No             Capillary Blood Glucose: No results found for this or any previous visit (from the past 24 hour(s)).    Social History   Tobacco Use  Smoking Status Former   Packs/day: 1.00   Years: 25.00   Additional pack years: 0.00   Total pack years: 25.00   Types: Cigarettes   Quit date: 69   Years since quitting: 29.3   Passive exposure: Never  Smokeless Tobacco Never    Goals Met:  Proper associated with RPD/PD & O2 Sat Independence with exercise equipment Exercise tolerated well No report of concerns or symptoms today Strength training completed today  Goals Unmet:  Not Applicable  Comments: Service time is from 1020 to 1139.    Dr. Mechele Collin is Medical Director for Pulmonary Rehab at [facility].

## 2023-02-15 ENCOUNTER — Other Ambulatory Visit: Payer: Self-pay | Admitting: Physical Medicine and Rehabilitation

## 2023-02-15 ENCOUNTER — Telehealth: Payer: Self-pay | Admitting: Pharmacist

## 2023-02-15 ENCOUNTER — Other Ambulatory Visit (HOSPITAL_COMMUNITY): Payer: Self-pay

## 2023-02-15 MED ORDER — MOUNJARO 5 MG/0.5ML ~~LOC~~ SOAJ
5.0000 mg | SUBCUTANEOUS | 0 refills | Status: DC
  Filled 2023-02-15: qty 2, 28d supply, fill #0

## 2023-02-15 NOTE — Telephone Encounter (Signed)
Rx sent in for [facility] 5mg , advised pt to let me know if pharmacy can't get in stock again this month - he stayed on 2.5mg  dose for an extra month because of this last month.

## 2023-02-18 ENCOUNTER — Other Ambulatory Visit (HOSPITAL_COMMUNITY): Payer: Self-pay

## 2023-02-19 ENCOUNTER — Encounter (HOSPITAL_COMMUNITY)
Admission: RE | Admit: 2023-02-19 | Discharge: 2023-02-19 | Disposition: A | Discharge status: Home / Self Care | Payer: Medicare Other | Source: Ambulatory Visit | Attending: Cardiology | Admitting: Cardiology

## 2023-02-19 VITALS — Wt 257.9 lb

## 2023-02-19 DIAGNOSIS — I272 Pulmonary hypertension, unspecified: Secondary | ICD-10-CM | POA: Diagnosis not present

## 2023-02-19 NOTE — Progress Notes (Signed)
Daily Session Note  Patient Details  Name: Keith [name] MRN: [id number] Date of Birth: 06-28-1951 Referring Provider:   Doristine Devoid Pulmonary Rehab Walk Test from 01/14/2023 in [facility] for Heart, Vascular, & Lung Health  Referring Provider [name]       Encounter Date: 02/19/2023  Check In:  Session Check In - 02/19/23 1154       Check-In   Supervising physician immediately available to respond to emergencies [facility] MD immediately available    Physician(s) Jari Favre, PA    Location [facility]    Staff Present Samantha Belarus, RD, [place], RN, BSN;Randi Reeve BS, ACSM-CEP, Exercise Physiologist;Olinty Peggye Pitt, MS, ACSM-CEP, Exercise Physiologist;Kaylee Earlene Plater, MS, ACSM-CEP, Exercise Physiologist;Coral Timme Katrinka Blazing, RT    Virtual Visit No    Medication changes reported     No    Fall or balance concerns reported    No    Tobacco Cessation No Change    Warm-up and Cool-down Performed as group-led instruction    Resistance Training Performed Yes    VAD Patient? No    PAD/SET Patient? No      Pain Assessment   Currently in Pain? No/denies    Multiple Pain Sites No             Capillary Blood Glucose: No results found for this or any previous visit (from the past 24 hour(s)).   Exercise Prescription Changes - 02/19/23 1200       Response to Exercise   Blood Pressure (Admit) 120/70    Blood Pressure (Exercise) 118/70    Blood Pressure (Exit) 102/60    Heart Rate (Admit) 100 bpm    Heart Rate (Exercise) 111 bpm    Heart Rate (Exit) 97 bpm    Oxygen Saturation (Admit) 98 %    Oxygen Saturation (Exercise) 93 %    Oxygen Saturation (Exit) 97 %    Rating of Perceived Exertion (Exercise) 11    Perceived Dyspnea (Exercise) 1    Duration Continue with 30 min of aerobic exercise without signs/symptoms of physical distress.    Intensity THRR unchanged      Progression   Progression Continue to progress workloads to  maintain intensity without signs/symptoms of physical distress.      Resistance Training   Training Prescription Yes    Weight red bands    Reps 10-15    Time 10 Minutes      T5 Nustep   Level 2    Minutes 15    METs 2             Social History   Tobacco Use  Smoking Status Former   Packs/day: 1.00   Years: 25.00   Additional pack years: 0.00   Total pack years: 25.00   Types: Cigarettes   Quit date: 76   Years since quitting: 29.3   Passive exposure: Never  Smokeless Tobacco Never    Goals Met:  Proper associated with RPD/PD & O2 Sat Independence with exercise equipment Exercise tolerated well No report of concerns or symptoms today Strength training completed today  Goals Unmet:  Not Applicable  Comments: Service time is from 1010 to 1150.    Dr. Mechele Collin is Medical Director for Pulmonary Rehab at [facility].

## 2023-02-20 NOTE — Progress Notes (Signed)
Pulmonary Individual Treatment Plan  Patient Details  Name: Keith [name] MRN: [id number] Date of Birth: 1951-01-10 Referring Provider:   Doristine Devoid Pulmonary Rehab Walk Test from 01/14/2023 in [facility]  Referring Provider [facility]       Initial Encounter Date:  Flowsheet Row Pulmonary Rehab Walk Test from 01/14/2023 in [facility]  Date 01/14/23       Visit Diagnosis: Pulmonary HTN (HCC)  Patient's Home Medications on Admission:   Current Outpatient Medications:    acetaminophen (TYLENOL) 325 MG tablet, Take 1-2 tablets (325-650 mg total) by mouth every 4 (four) hours as needed for mild pain., Disp: , Rfl:    albuterol (VENTOLIN HFA) 108 (90 Base) MCG/ACT inhaler, Inhale 2 puffs into the lungs every 6 (six) hours as needed for wheezing or shortness of breath., Disp: 8 g, Rfl: 1   amLODipine (NORVASC) 10 MG tablet, TAKE 1 TABLET BY MOUTH DAILY, Disp: 100 tablet, Rfl: 2   apixaban (ELIQUIS) 5 MG TABS tablet, Take 1 tablet (5 mg total) by mouth 2 (two) times daily., Disp: 60 tablet, Rfl: 11   Blood Glucose Monitoring Suppl (ACCU-CHEK GUIDE ME) w/Device KIT, USE TO TEST BLOOD SUGARS 1-2 TIMES DAILY., Disp: 1 kit, Rfl: 0   carvedilol (COREG) 12.5 MG tablet, Take 1 tablet (12.5 mg total) by mouth 2 (two) times daily with a meal., Disp: 180 tablet, Rfl: 3   cyanocobalamin (VITAMIN B12) 1000 MCG tablet, Take 1,000 mcg by mouth daily., Disp: , Rfl:    DULoxetine (CYMBALTA) 60 MG capsule, TAKE 1 CAPSULE BY MOUTH EVERY DAY, Disp: 90 capsule, Rfl: 0   ferrous sulfate 325 (65 FE) MG EC tablet, Take 325 mg by mouth every other day., Disp: , Rfl:    gabapentin (NEURONTIN) 400 MG capsule, Take 400 mg by mouth 3 (three) times daily., Disp: , Rfl:    HYDROcodone-acetaminophen (NORCO) 7.5-325 MG tablet, Take 1 tablet by mouth in the morning, at noon, in the evening, and at bedtime.  7.5 mg/375 mg, Disp: , Rfl:    hydrocortisone (ANUSOL-HC) 2.5 % rectal cream, Place 1 application. rectally as needed for hemorrhoids or anal itching., Disp: , Rfl:    levETIRAcetam (KEPPRA) 250 MG tablet, Take 1 tablet (250 mg total) by mouth 2 (two) times daily., Disp: 180 tablet, Rfl: 0   lidocaine (LIDODERM) 5 %, Place 1 patch onto the skin daily as needed (pain). Remove & Discard patch within 12 hours or as directed by MD, Disp: , Rfl:    naloxone (NARCAN) nasal spray 4 mg/0.1 mL, Place 1 spray into the nose as needed (accidental overdose)., Disp: , Rfl:    omeprazole (PRILOSEC) 40 MG capsule, Take 1 capsule (40 mg total) by mouth daily., Disp: 90 capsule, Rfl: 1   polyethylene glycol (MIRALAX / GLYCOLAX) 17 g packet, Take 17 g by mouth daily as needed for mild constipation., Disp: , Rfl:    Riociguat (ADEMPAS) 2 MG TABS, Take 1 tablet by mouth. In the morning, at noon, and at bedtime., Disp: , Rfl:    rOPINIRole (REQUIP) 1 MG tablet, Take 1 tablet (1 mg total) by mouth at bedtime., Disp: 90 tablet, Rfl: 2   rosuvastatin (CRESTOR) 20 MG tablet, Take 1 tablet (20 mg total) by mouth daily., Disp: 90 tablet, Rfl: 3   spironolactone (ALDACTONE) 25 MG tablet, Take 1 tablet (25 mg total) by mouth daily., Disp: 90  tablet, Rfl: 3   tirzepatide (MOUNJARO) 5 MG/0.5ML Pen, Inject 5 mg into the skin once a week., Disp: 2 mL, Rfl: 0   torsemide (DEMADEX) 20 MG tablet, TAKE 1 AND 1/2 TABLETS DAILY BY MOUTH, Disp: 135 tablet, Rfl: 2   traZODone (DESYREL) 50 MG tablet, Take 0.5-1 tablets (25-50 mg total) by mouth at bedtime as needed. for sleep, Disp: 90 tablet, Rfl: 0  Past Medical History: Past Medical History:  Diagnosis Date   Allergy    Chronic back pain    Diabetes mellitus without complication (HCC)    DNR (do not resuscitate) 11/19/2020   Duodenal ulcer hemorrhage 08/29/2014   ED (erectile dysfunction)    Esophageal stricture 08/30/2014   Glucose intolerance (impaired glucose tolerance)     Hiatal hernia 08/30/2014   Hypertension    Hypokalemia 04/11/2013   MRSA carrier 08/30/2014   Obesity    Osteoarthritis    Pneumonia    Pulmonary embolism (HCC) 06/2020   Scoliosis 2016   Spinal stenosis of lumbar region    Urinary tract infection 04/11/2013    Tobacco Use: Social History   Tobacco Use  Smoking Status Former   Packs/day: 1.00   Years: 25.00   Additional pack years: 0.00   Total pack years: 25.00   Types: Cigarettes   Quit date: 46   Years since quitting: 29.3   Passive exposure: Never  Smokeless Tobacco Never    Labs: Review Flowsheet  More data exists      Latest Ref Rng & Units 03/02/2022 07/06/2022 07/18/2022 11/02/2022 12/24/2022  Labs for ITP Cardiac and Pulmonary Rehab  Hemoglobin A1c 4.8 - 5.6 % - - 6.2  6.1  5.5   PH, Arterial 7.35 - 7.45 7.35 - 7.45 7.429  7.438  - - - -  PCO2 arterial 32 - 48 mmHg 32 - 48 mmHg 46.7  46.1  - - - -  Bicarbonate 20.0 - 28.0 mmol/L 20.0 - 28.0 mmol/L 31.0  31.2  35.2  28.5  27.3  - - -  TCO2 22 - 32 mmol/L 22 - 32 mmol/L 32  33  37  30  29  - - -  O2 Saturation % % 96  97  57  60  59  - - -    Capillary Blood Glucose: Lab Results  Component Value Date   GLUCAP 127 (H) 07/06/2022   GLUCAP 106 (H) 09/06/2021   GLUCAP 96 09/05/2021   GLUCAP 78 09/05/2021   GLUCAP 98 09/04/2021     Pulmonary Assessment Scores:  Pulmonary Assessment Scores     Row Name 01/14/23 1258         ADL UCSD   SOB Score total 76       CAT Score   CAT Score 25       mMRC Score   mMRC Score 4             UCSD: Self-administered rating of dyspnea associated with activities of daily living (ADLs) 6-point scale (0 = "not at all" to 5 = "maximal or unable to do because of breathlessness")  Scoring Scores range from 0 to 120.  Minimally important difference is 5 units  CAT: CAT can identify the health impairment of COPD patients and is better correlated with disease progression.  CAT has a scoring range of zero to  40. The CAT score is classified into four groups of low (less than 10), medium (10 - 20), high (21-30) and very high (  31-40) based on the impact level of disease on health status. A CAT score over 10 suggests significant symptoms.  A worsening CAT score could be explained by an exacerbation, poor medication adherence, poor inhaler technique, or progression of COPD or comorbid conditions.  CAT MCID is 2 points  mMRC: mMRC (Modified Medical Research Council) Dyspnea Scale is used to assess the degree of baseline functional disability in patients of respiratory disease due to dyspnea. No minimal important difference is established. A decrease in score of 1 point or greater is considered a positive change.   Pulmonary Function Assessment:  Pulmonary Function Assessment - 01/14/23 1301       Breath   Bilateral Breath Sounds Decreased    Shortness of Breath Limiting activity;Yes             Exercise Target Goals: Exercise Program Goal: Individual exercise prescription set using results from initial 6 min walk test and THRR while considering  patient's activity barriers and safety.   Exercise Prescription Goal: Initial exercise prescription builds to 30-45 minutes a day of aerobic activity, 2-3 days per week.  Home exercise guidelines will be given to patient during program as part of exercise prescription that the participant will acknowledge.  Activity Barriers & Risk Stratification:  Activity Barriers & Cardiac Risk Stratification - 01/14/23 1104       Activity Barriers & Cardiac Risk Stratification   Activity Barriers Deconditioning;Muscular Weakness;Shortness of Breath;Back Problems;Balance Concerns             6 Minute Walk:  6 Minute Walk     Row Name 01/14/23 1207         6 Minute Walk   Phase Initial  Performed on NuStep     Distance 1049.9 feet     Walk Time 6 minutes     # of Rest Breaks 0     MPH 1.99     METS 2.1     RPE 9     Perceived Dyspnea  1      VO2 Peak 7.36     Resting HR 94 bpm     Resting BP 122/62     Resting Oxygen Saturation  96 %     Exercise Oxygen Saturation  during 6 min walk 96 %     Max Ex. HR 115 bpm     Max Ex. BP 134/62     2 Minute Post BP 120/60       Interval HR   1 Minute HR 105     2 Minute HR 107     3 Minute HR 109     4 Minute HR 111     5 Minute HR 106     6 Minute HR 115     2 Minute Post HR 98     Interval Heart Rate? Yes       Interval Oxygen   Interval Oxygen? Yes     Baseline Oxygen Saturation % 96 %     1 Minute Oxygen Saturation % 97 %     1 Minute Liters of Oxygen 0 L     2 Minute Oxygen Saturation % 98 %     2 Minute Liters of Oxygen 0 L     3 Minute Oxygen Saturation % 98 %     3 Minute Liters of Oxygen 0 L     4 Minute Oxygen Saturation % 98 %     4 Minute Liters of Oxygen 0 L  5 Minute Oxygen Saturation % 99 %     5 Minute Liters of Oxygen 0 L     6 Minute Oxygen Saturation % 97 %     6 Minute Liters of Oxygen 0 L     2 Minute Post Oxygen Saturation % 97 %     2 Minute Post Liters of Oxygen 0 L              Oxygen Initial Assessment:  Oxygen Initial Assessment - 01/14/23 1223       Home Oxygen   Home Oxygen Device None    Sleep Oxygen Prescription None    Home Exercise Oxygen Prescription None    Home Resting Oxygen Prescription None      Initial 6 min Walk   Oxygen Used None      Program Oxygen Prescription   Program Oxygen Prescription None      Intervention   Short Term Goals To learn and understand importance of maintaining oxygen saturations>88%;To learn and demonstrate proper use of respiratory medications;To learn and understand importance of monitoring SPO2 with pulse oximeter and demonstrate accurate use of the pulse oximeter.;To learn and demonstrate proper pursed lip breathing techniques or other breathing techniques.     Long  Term Goals Verbalizes importance of monitoring SPO2 with pulse oximeter and return demonstration;Maintenance of O2  saturations>88%;Exhibits proper breathing techniques, such as pursed lip breathing or other method taught during program session;Compliance with respiratory medication;Demonstrates proper use of MDI's             Oxygen Re-Evaluation:  Oxygen Re-Evaluation     Row Name 01/15/23 0841 02/14/23 1604           Program Oxygen Prescription   Program Oxygen Prescription None None        Home Oxygen   Home Oxygen Device None None      Sleep Oxygen Prescription None None      Home Exercise Oxygen Prescription None None      Home Resting Oxygen Prescription None None        Goals/Expected Outcomes   Short Term Goals To learn and understand importance of maintaining oxygen saturations>88%;To learn and demonstrate proper use of respiratory medications;To learn and understand importance of monitoring SPO2 with pulse oximeter and demonstrate accurate use of the pulse oximeter.;To learn and demonstrate proper pursed lip breathing techniques or other breathing techniques.  To learn and understand importance of maintaining oxygen saturations>88%;To learn and demonstrate proper use of respiratory medications;To learn and understand importance of monitoring SPO2 with pulse oximeter and demonstrate accurate use of the pulse oximeter.;To learn and demonstrate proper pursed lip breathing techniques or other breathing techniques.       Long  Term Goals Verbalizes importance of monitoring SPO2 with pulse oximeter and return demonstration;Maintenance of O2 saturations>88%;Exhibits proper breathing techniques, such as pursed lip breathing or other method taught during program session;Compliance with respiratory medication;Demonstrates proper use of MDI's Verbalizes importance of monitoring SPO2 with pulse oximeter and return demonstration;Maintenance of O2 saturations>88%;Exhibits proper breathing techniques, such as pursed lip breathing or other method taught during program session;Compliance with respiratory  medication;Demonstrates proper use of MDI's      Goals/Expected Outcomes Compliance and understanding of oxygen saturation monitoring and breathing techniques to decrease shortness of breath. Compliance and understanding of oxygen saturation monitoring and breathing techniques to decrease shortness of breath.               Oxygen Discharge (Final Oxygen Re-Evaluation):  Oxygen  Re-Evaluation - 02/14/23 1604       Program Oxygen Prescription   Program Oxygen Prescription None      Home Oxygen   Home Oxygen Device None    Sleep Oxygen Prescription None    Home Exercise Oxygen Prescription None    Home Resting Oxygen Prescription None      Goals/Expected Outcomes   Short Term Goals To learn and understand importance of maintaining oxygen saturations>88%;To learn and demonstrate proper use of respiratory medications;To learn and understand importance of monitoring SPO2 with pulse oximeter and demonstrate accurate use of the pulse oximeter.;To learn and demonstrate proper pursed lip breathing techniques or other breathing techniques.     Long  Term Goals Verbalizes importance of monitoring SPO2 with pulse oximeter and return demonstration;Maintenance of O2 saturations>88%;Exhibits proper breathing techniques, such as pursed lip breathing or other method taught during program session;Compliance with respiratory medication;Demonstrates proper use of MDI's    Goals/Expected Outcomes Compliance and understanding of oxygen saturation monitoring and breathing techniques to decrease shortness of breath.             Initial Exercise Prescription:  Initial Exercise Prescription - 01/14/23 1200       Date of Initial Exercise RX and Referring Provider   Date 01/14/23    Referring Provider [name]    Expected Discharge Date 04/09/23      T5 Nustep   Level 1    SPM 50    Minutes 30    METs 2.1      Prescription Details   Frequency (times per week) 2    Duration Progress to 30 minutes of  continuous aerobic without signs/symptoms of physical distress      Intensity   THRR 40-80% of Max Heartrate 60-120    Ratings of Perceived Exertion 11-13    Perceived Dyspnea 0-4      Progression   Progression Continue progressive overload as per policy without signs/symptoms or physical distress.      Resistance Training   Training Prescription Yes    Weight red bands    Reps 10-15             Perform Capillary Blood Glucose checks as needed.  Exercise Prescription Changes:   Exercise Prescription Changes     Row Name 01/22/23 1600 02/05/23 1200 02/19/23 1200         Response to Exercise   Blood Pressure (Admit) 100/60 108/56 120/70     Blood Pressure (Exercise) 126/70 142/72 118/70     Blood Pressure (Exit) 112/64 100/60 102/60     Heart Rate (Admit) 95 bpm 96 bpm 100 bpm     Heart Rate (Exercise) 112 bpm 111 bpm 111 bpm     Heart Rate (Exit) 99 bpm 104 bpm 97 bpm     Oxygen Saturation (Admit) 98 % 98 % 98 %     Oxygen Saturation (Exercise) 97 % 94 % 93 %     Oxygen Saturation (Exit) 98 % 98 % 97 %     Rating of Perceived Exertion (Exercise) 13 10 11      Perceived Dyspnea (Exercise) 1 1 1      Duration Progress to 30 minutes of  aerobic without signs/symptoms of physical distress Progress to 30 minutes of  aerobic without signs/symptoms of physical distress Continue with 30 min of aerobic exercise without signs/symptoms of physical distress.     Intensity THRR unchanged THRR unchanged THRR unchanged       Progression   Progression  Continue to progress workloads to maintain intensity without signs/symptoms of physical distress. Continue to progress workloads to maintain intensity without signs/symptoms of physical distress. Continue to progress workloads to maintain intensity without signs/symptoms of physical distress.       Resistance Training   Training Prescription Yes Yes Yes     Weight red bands red bands red bands     Reps 10-15 10-15 10-15     Time 10  Minutes 10 Minutes 10 Minutes       T5 Nustep   Level 1 2 2      SPM 50 60 --     Minutes 30 30 15      METs 1.7 1.9 2              Exercise Comments:   Exercise Comments     Row Name 01/22/23 1603           Exercise Comments Pt completed first day of group exercise. Pt needs max help x2 with gait belt to stand and transfer to equipment. He exercised on the recumbent stepper for 30 min, level 1, METs 1.7. Performed warm up and cool down in his wheelchair, unable to do leg exercises or stretches therefore did more arm exercises. Pt is motivated. Discussed METs. Will progress as tolerated.                Exercise Goals and Review:   Exercise Goals     Row Name 01/14/23 1101 01/15/23 0840 02/14/23 1558         Exercise Goals   Increase Physical Activity Yes Yes Yes     Intervention Provide advice, education, support and counseling about physical activity/exercise needs.;Develop an individualized exercise prescription for aerobic and resistive training based on initial evaluation findings, risk stratification, comorbidities and participant's personal goals. Provide advice, education, support and counseling about physical activity/exercise needs.;Develop an individualized exercise prescription for aerobic and resistive training based on initial evaluation findings, risk stratification, comorbidities and participant's personal goals. Provide advice, education, support and counseling about physical activity/exercise needs.;Develop an individualized exercise prescription for aerobic and resistive training based on initial evaluation findings, risk stratification, comorbidities and participant's personal goals.     Expected Outcomes Short Term: Attend rehab on a regular basis to increase amount of physical activity.;Long Term: Exercising regularly at least 3-5 days a week.;Long Term: Add in home exercise to make exercise part of routine and to increase amount of physical activity.  Short Term: Attend rehab on a regular basis to increase amount of physical activity.;Long Term: Exercising regularly at least 3-5 days a week.;Long Term: Add in home exercise to make exercise part of routine and to increase amount of physical activity. Short Term: Attend rehab on a regular basis to increase amount of physical activity.;Long Term: Exercising regularly at least 3-5 days a week.;Long Term: Add in home exercise to make exercise part of routine and to increase amount of physical activity.     Increase Strength and Stamina Yes Yes Yes     Intervention Provide advice, education, support and counseling about physical activity/exercise needs.;Develop an individualized exercise prescription for aerobic and resistive training based on initial evaluation findings, risk stratification, comorbidities and participant's personal goals. Provide advice, education, support and counseling about physical activity/exercise needs.;Develop an individualized exercise prescription for aerobic and resistive training based on initial evaluation findings, risk stratification, comorbidities and participant's personal goals. Provide advice, education, support and counseling about physical activity/exercise needs.;Develop an individualized exercise prescription for aerobic and resistive  training based on initial evaluation findings, risk stratification, comorbidities and participant's personal goals.     Expected Outcomes Short Term: Increase workloads from initial exercise prescription for resistance, speed, and METs.;Short Term: Perform resistance training exercises routinely during rehab and add in resistance training at home;Long Term: Improve cardiorespiratory fitness, muscular endurance and strength as measured by increased METs and functional capacity ( ) Short Term: Increase workloads from initial exercise prescription for resistance, speed, and METs.;Short Term: Perform resistance training exercises routinely during  rehab and add in resistance training at home;Long Term: Improve cardiorespiratory fitness, muscular endurance and strength as measured by increased METs and functional capacity ( ) Short Term: Increase workloads from initial exercise prescription for resistance, speed, and METs.;Short Term: Perform resistance training exercises routinely during rehab and add in resistance training at home;Long Term: Improve cardiorespiratory fitness, muscular endurance and strength as measured by increased METs and functional capacity ( )     Able to understand and use rate of perceived exertion (RPE) scale Yes Yes Yes     Intervention Provide education and explanation on how to use RPE scale Provide education and explanation on how to use RPE scale Provide education and explanation on how to use RPE scale     Expected Outcomes Short Term: Able to use RPE daily in rehab to express subjective intensity level;Long Term:  Able to use RPE to guide intensity level when exercising independently Short Term: Able to use RPE daily in rehab to express subjective intensity level;Long Term:  Able to use RPE to guide intensity level when exercising independently Short Term: Able to use RPE daily in rehab to express subjective intensity level;Long Term:  Able to use RPE to guide intensity level when exercising independently     Able to understand and use Dyspnea scale Yes Yes Yes     Intervention Provide education and explanation on how to use Dyspnea scale Provide education and explanation on how to use Dyspnea scale Provide education and explanation on how to use Dyspnea scale     Expected Outcomes Short Term: Able to use Dyspnea scale daily in rehab to express subjective sense of shortness of breath during exertion;Long Term: Able to use Dyspnea scale to guide intensity level when exercising independently Short Term: Able to use Dyspnea scale daily in rehab to express subjective sense of shortness of breath during exertion;Long  Term: Able to use Dyspnea scale to guide intensity level when exercising independently Short Term: Able to use Dyspnea scale daily in rehab to express subjective sense of shortness of breath during exertion;Long Term: Able to use Dyspnea scale to guide intensity level when exercising independently     Knowledge and understanding of Target Heart Rate Range (THRR) Yes Yes Yes     Intervention Provide education and explanation of THRR including how the numbers were predicted and where they are located for reference Provide education and explanation of THRR including how the numbers were predicted and where they are located for reference Provide education and explanation of THRR including how the numbers were predicted and where they are located for reference     Expected Outcomes Short Term: Able to state/look up THRR;Short Term: Able to use daily as guideline for intensity in rehab;Long Term: Able to use THRR to govern intensity when exercising independently Short Term: Able to state/look up THRR;Short Term: Able to use daily as guideline for intensity in rehab;Long Term: Able to use THRR to govern intensity when exercising independently Short Term: Able to state/look up THRR;Short Term:  Able to use daily as guideline for intensity in rehab;Long Term: Able to use THRR to govern intensity when exercising independently     Understanding of Exercise Prescription Yes Yes Yes     Intervention Provide education, explanation, and written materials on patient's individual exercise prescription Provide education, explanation, and written materials on patient's individual exercise prescription Provide education, explanation, and written materials on patient's individual exercise prescription     Expected Outcomes Short Term: Able to explain program exercise prescription;Long Term: Able to explain home exercise prescription to exercise independently Short Term: Able to explain program exercise prescription;Long Term: Able  to explain home exercise prescription to exercise independently Short Term: Able to explain program exercise prescription;Long Term: Able to explain home exercise prescription to exercise independently              Exercise Goals Re-Evaluation :  Exercise Goals Re-Evaluation     Row Name 01/15/23 0840 02/14/23 1600           Exercise Goal Re-Evaluation   Exercise Goals Review Increase Physical Activity;Able to understand and use Dyspnea scale;Understanding of Exercise Prescription;Increase Strength and Stamina;Knowledge and understanding of Target Heart Rate Range (THRR);Able to understand and use rate of perceived exertion (RPE) scale Increase Physical Activity;Able to understand and use Dyspnea scale;Understanding of Exercise Prescription;Increase Strength and Stamina;Knowledge and understanding of Target Heart Rate Range (THRR);Able to understand and use rate of perceived exertion (RPE) scale      Comments Pt is scheduled to begin exercise this week. Will continue to monitor and progress as able. Ronav has completed 8 exercise sessions. He exercises for 30 min on the Nustep. Thong averages 2 METs at level 2 on the Nustep. He perform the warmup and cooldown seated in his wheelchair. Broxton does an alternative warmup and cooldown due to his limits. He is postive and seems to enjoy the program. Alba is deconditioned as his progression will be limited to increasing his workload on the Nustep. Will continue to monitor and progress as able.      Expected Outcomes Through exercise at rehab and home, the patient will decrease shortness of breath with daily activities and feel confident in carrying out an exercise regimen at home. Through exercise at rehab and home, the patient will decrease shortness of breath with daily activities and feel confident in carrying out an exercise regimen at home.               Discharge Exercise Prescription (Final Exercise Prescription Changes):  Exercise  Prescription Changes - 02/19/23 1200       Response to Exercise   Blood Pressure (Admit) 120/70    Blood Pressure (Exercise) 118/70    Blood Pressure (Exit) 102/60    Heart Rate (Admit) 100 bpm    Heart Rate (Exercise) 111 bpm    Heart Rate (Exit) 97 bpm    Oxygen Saturation (Admit) 98 %    Oxygen Saturation (Exercise) 93 %    Oxygen Saturation (Exit) 97 %    Rating of Perceived Exertion (Exercise) 11    Perceived Dyspnea (Exercise) 1    Duration Continue with 30 min of aerobic exercise without signs/symptoms of physical distress.    Intensity THRR unchanged      Progression   Progression Continue to progress workloads to maintain intensity without signs/symptoms of physical distress.      Resistance Training   Training Prescription Yes    Weight red bands    Reps 10-15    Time 10  Minutes      T5 Nustep   Level 2    Minutes 15    METs 2             Nutrition:  Target Goals: Understanding of nutrition guidelines, daily intake of sodium 1500mg , cholesterol 200mg , calories 30% from fat and 7% or less from saturated fats, daily to have 5 or more servings of fruits and vegetables.  Biometrics:    Nutrition Therapy Plan and Nutrition Goals:  Nutrition Therapy & Goals - 02/19/23 1045       Nutrition Therapy   Diet Heart healthy/Carbohydrate Consistent Diet    Drug/Food Interactions Statins/Certain Fruits      Personal Nutrition Goals   Nutrition Goal Patient to improve diet quality by using the plate method as a guide for meal planning to include lean protein/plant protein, fruits, vegetables, whole grains, nonfat dairy as part of a well-balanced diet.    Personal Goal #2 Patient to identify strategies for weight loss of 0.5-2.0# per week.    Personal Goal #3 Patient to limit sodium 2000mg  per day.    Comments Goals in action. Harvy has started making many dietary changes including reduced sodium intake, reduced refined carbohydrates/simple sugars. Reviewed  strategies for weight loss including benefits of high fiber/high protein intake and reducing calories from carbohydrates and fat. He countinues mounjaro to aid with weight loss. Carver will benefit from participation in pulmonary rehab for nutrition, exercise, and lifestyle modification.      Intervention Plan   Intervention Prescribe, educate and counsel regarding individualized specific dietary modifications aiming towards targeted core components such as weight, hypertension, lipid management, diabetes, heart failure and other comorbidities.;Nutrition handout(s) given to patient.    Expected Outcomes Short Term Goal: Understand basic principles of dietary content, such as calories, fat, sodium, cholesterol and nutrients.;Long Term Goal: Adherence to prescribed nutrition plan.             Nutrition Assessments:  MEDIFICTS Score Key: ?70 Need to make dietary changes  40-70 Heart Healthy Diet ? 40 Therapeutic Level Cholesterol Diet  Flowsheet Row PULMONARY REHAB OTHER RESPIRATORY from [facility]/22/2023 in [facility]  Picture Your Plate Total Score on Admission 52      Picture Your Plate Scores: <16 Unhealthy dietary pattern with much room for improvement. 41-50 Dietary pattern unlikely to meet recommendations for good health and room for improvement. 51-60 More healthful dietary pattern, with some room for improvement.  >60 Healthy dietary pattern, although there may be some specific behaviors that could be improved.    Nutrition Goals Re-Evaluation:  Nutrition Goals Re-Evaluation     Row Name 01/24/23 1201 02/19/23 1045           Goals   Current Weight 267 lb 10.2 oz (121.4 kg) 257 lb 15 oz (117 kg)      Comment A1c WNL, lipoprotein A 147.2, lipids WNL (LDL 73). no new labs; most recent labs A1c WNL, lipoprotein A 147.2, lipids WNL (LDL 73)      Expected Outcome Ziyang has started making many dietary changes including  reduced sodium intake, reduced refined carbohydrates/simple sugars. He reports weight loss of ~50# over the last year through dietary changes; he reports highest weight ~315#. He started mounjaro to aid with weight loss. Jaymes will benefit from participation in pulmonary rehab for nutrition, exercise, and lifestyle modification. Goals in action. Tosh has started making many dietary changes including reduced sodium intake, reduced refined carbohydrates/simple  sugars. Reviewed strategies for weight loss including benefits of high fiber/high protein intake and reducing calories from carbohydrates and fat. He countinues mounjaro to aid with weight loss. Arka will benefit from participation in pulmonary rehab for nutrition, exercise, and lifestyle modification.               Nutrition Goals Discharge (Final Nutrition Goals Re-Evaluation):  Nutrition Goals Re-Evaluation - 02/19/23 1045       Goals   Current Weight 257 lb 15 oz (117 kg)    Comment no new labs; most recent labs A1c WNL, lipoprotein A 147.2, lipids WNL (LDL 73)    Expected Outcome Goals in action. Nkosi has started making many dietary changes including reduced sodium intake, reduced refined carbohydrates/simple sugars. Reviewed strategies for weight loss including benefits of high fiber/high protein intake and reducing calories from carbohydrates and fat. He countinues mounjaro to aid with weight loss. Benji will benefit from participation in pulmonary rehab for nutrition, exercise, and lifestyle modification.             Psychosocial: Target Goals: Acknowledge presence or absence of significant depression and/or stress, maximize coping skills, provide positive support system. Participant is able to verbalize types and ability to use techniques and skills needed for reducing stress and depression.  Initial Review & Psychosocial Screening:  Initial Psych Review & Screening - 01/14/23 1050       Initial Review   Current  issues with Current Sleep Concerns      Family Dynamics   Good Support System? Yes    Comments Pt wears Cpap nightly. Can only tolerate for about 4 hours. Pt also stated he can only sleep on his back due to prior surgeries. Pt has sleeping meds he takes. He states that these do help.      Barriers   Psychosocial barriers to participate in program Psychosocial barriers identified (see note)      Screening Interventions   Interventions Encouraged to exercise    Expected Outcomes Long Term Goal: Stressors or current issues are controlled or eliminated.             Quality of Life Scores:  Scores of 19 and below usually indicate a poorer quality of life in these areas.  A difference of  2-3 points is a clinically meaningful difference.  A difference of 2-3 points in the total score of the Quality of Life Index has been associated with significant improvement in overall quality of life, self-image, physical symptoms, and general health in studies assessing change in quality of life.  PHQ-9: Review Flowsheet  More data exists      01/14/2023 11/02/2022 06/06/2022 05/25/2022 04/02/2022  Depression screen PHQ 2/9  Decreased Interest 0 0 0 0 0  Down, Depressed, Hopeless 0 0 0 0 0  PHQ - 2 Score 0 0 0 0 0  Altered sleeping 1 - - - -  Tired, decreased energy 1 - - - -  Change in appetite 0 - - - -  Feeling bad or failure about yourself  0 - - - -  Trouble concentrating 0 - - - -  Moving slowly or fidgety/restless 0 - - - -  Suicidal thoughts 0 - - - -  PHQ-9 Score 2 - - - -  Difficult doing work/chores Not difficult at all - - - -   Interpretation of Total Score  Total Score Depression Severity:  1-4 = Minimal depression, 5-9 = Mild depression, 10-14 = Moderate depression, 15-19 =  Moderately severe depression, 20-27 = Severe depression   Psychosocial Evaluation and Intervention:  Psychosocial Evaluation - 01/14/23 1059       Psychosocial Evaluation & Interventions   Interventions  Encouraged to exercise with the program and follow exercise prescription    Comments Pt admits to trouble sleeping, but stated it's better than it use to be. He is currently working with his MD for medication needed.    Expected Outcomes For Yvan to attend PR without any psychosocial barriers or concerns.    Continue Psychosocial Services  No Follow up required             Psychosocial Re-Evaluation:  Psychosocial Re-Evaluation     Row Name 01/14/23 1249 02/07/23 1610           Psychosocial Re-Evaluation   Current issues with Current Sleep Concerns Current Sleep Concerns      Comments No changes from orientation Trelon states that his sleep is improving despite only tolerating his Cpap for 4-5 hours each night. He can also only sleep on his back due to his previous back surgeries but says he gets more restful sleep. He is compliant with his sleeping meds and he stated they "seem to be working". Sol has great support from his wife who helps with his ADLs. He denies any other psychosocial issues.      Expected Outcomes For Feltus to attend PR without any psychosocial barriers or concerns. For Shanden to attend PR without any psychosocial barriers or concerns.      Interventions Encouraged to attend Pulmonary Rehabilitation for the exercise Encouraged to attend Pulmonary Rehabilitation for the exercise      Continue Psychosocial Services  No Follow up required No Follow up required               Psychosocial Discharge (Final Psychosocial Re-Evaluation):  Psychosocial Re-Evaluation - 02/07/23 0833       Psychosocial Re-Evaluation   Current issues with Current Sleep Concerns    Comments Lorance states that his sleep is improving despite only tolerating his Cpap for 4-5 hours each night. He can also only sleep on his back due to his previous back surgeries but says he gets more restful sleep. He is compliant with his sleeping meds and he stated they "seem to be working". Kaique has  great support from his wife who helps with his ADLs. He denies any other psychosocial issues.    Expected Outcomes For Oluwaferanmi to attend PR without any psychosocial barriers or concerns.    Interventions Encouraged to attend Pulmonary Rehabilitation for the exercise    Continue Psychosocial Services  No Follow up required             Education: Education Goals: Education classes will be provided on a weekly basis, covering required topics. Participant will state understanding/return demonstration of topics presented.  Learning Barriers/Preferences:   Education Topics: Introduction to Pulmonary Rehab Group instruction provided by PowerPoint, verbal discussion, and written material to support subject matter. Instructor reviews what Pulmonary Rehab is, the purpose of the program, and how patients are referred.     Know Your Numbers Group instruction that is supported by a PowerPoint presentation. Instructor discusses importance of knowing and understanding resting, exercise, and post-exercise oxygen saturation, heart rate, and blood pressure. Oxygen saturation, heart rate, blood pressure, rating of perceived exertion, and dyspnea are reviewed along with a normal range for these values.    Exercise for the Pulmonary Patient Group instruction that is supported by a  PowerPoint presentation. Instructor discusses benefits of exercise, core components of exercise, frequency, duration, and intensity of an exercise routine, importance of utilizing pulse oximetry during exercise, safety while exercising, and options of places to exercise outside of rehab.     MET Level  Group instruction provided by PowerPoint, verbal discussion, and written material to support subject matter. Instructor reviews what METs are and how to increase METs.  Flowsheet Row PULMONARY REHAB OTHER RESPIRATORY from 02/07/2023 in [facility], [facility]  Date 02/07/23  Educator  EP  Instruction Review Code 1- Verbalizes Understanding       Pulmonary Medications Verbally interactive group education provided by instructor with focus on inhaled medications and proper administration.   Anatomy and Physiology of the Respiratory System Group instruction provided by PowerPoint, verbal discussion, and written material to support subject matter. Instructor reviews respiratory cycle and anatomical components of the respiratory system and their functions. Instructor also reviews differences in obstructive and restrictive respiratory diseases with examples of each.    Oxygen Safety Group instruction provided by PowerPoint, verbal discussion, and written material to support subject matter. There is an overview of "What is Oxygen" and "Why do we need it".  Instructor also reviews how to create a safe environment for oxygen use, the importance of using oxygen as prescribed, and the risks of noncompliance. There is a brief discussion on traveling with oxygen and resources the patient may utilize. Flowsheet Row PULMONARY REHAB OTHER RESPIRATORY from 01/24/2023 in [facility], [facility], [facility]  Date 01/24/23  Educator RN  Instruction Review Code 1- Verbalizes Understanding       Oxygen Use Group instruction provided by PowerPoint, verbal discussion, and written material to discuss how supplemental oxygen is prescribed and different types of oxygen supply systems. Resources for more information are provided.  Flowsheet Row PULMONARY REHAB OTHER RESPIRATORY from 01/31/2023 in [facility] for [facility], Vascular, & Lung Health  Date 01/31/23  Educator RT  Instruction Review Code 1- Verbalizes Understanding       Breathing Techniques Group instruction that is supported by demonstration and informational handouts. Instructor discusses the benefits of pursed lip and diaphragmatic breathing and detailed demonstration on  how to perform both.  Flowsheet Row PULMONARY REHAB OTHER RESPIRATORY from 02/14/2023 in [facility] for Heart, Vascular, & Lung Health  Date 02/14/23  Educator RT  Instruction Review Code 1- Verbalizes Understanding        Risk Factor Reduction Group instruction that is supported by a PowerPoint presentation. Instructor discusses the definition of a risk factor, different risk factors for pulmonary disease, and how the heart and lungs work together.   MD Day A group question and answer session with a medical doctor that allows participants to ask questions that relate to their pulmonary disease state.   Nutrition for the Pulmonary Patient Group instruction provided by PowerPoint slides, verbal discussion, and written materials to support subject matter. The instructor gives an explanation and review of healthy diet recommendations, which includes a discussion on weight management, recommendations for fruit and vegetable consumption, as well as protein, fluid, caffeine, fiber, sodium, sugar, and alcohol. Tips for eating when patients are short of breath are discussed.    Other Education Group or individual verbal, written, or video instructions that support the educational goals of the pulmonary rehab program.    Knowledge Questionnaire Score:  Knowledge Questionnaire Score - 01/14/23 1300  Knowledge Questionnaire Score   Pre Score 16/18             Core Components/Risk Factors/Patient Goals at Admission:  Personal Goals and Risk Factors at Admission - 01/14/23 1100       Core Components/Risk Factors/Patient Goals on Admission    Weight Management Weight Loss    Improve shortness of breath with ADL's Yes    Intervention Provide education, individualized exercise plan and daily activity instruction to help decrease symptoms of SOB with activities of daily living.    Expected Outcomes Short Term: Improve cardiorespiratory fitness to achieve a  reduction of symptoms when performing ADLs             Core Components/Risk Factors/Patient Goals Review:   Goals and Risk Factor Review     Row Name 01/14/23 1250 02/11/23 1044 02/11/23 1046         Core Components/Risk Factors/Patient Goals Review   Personal Goals Review Weight Management/Obesity;Improve shortness of breath with ADL's;Develop more efficient breathing techniques such as purse lipped breathing and diaphragmatic breathing and practicing self-pacing with activity. -- Weight Management/Obesity;Improve shortness of breath with ADL's;Develop more efficient breathing techniques such as purse lipped breathing and diaphragmatic breathing and practicing self-pacing with activity.     Review Unable to assess goals as patient has not started the program yet. He is scheduled to start on 01/22/2023 -- Dyonte is working towards his goal of weight loss. He is currently working with our dietician. He has not made any progress towards his goal. Brand is also working on improving his shortness of breath with his ADLs. Stetson stated that he is slowly building stamina and can see a difference when he bathes. Ransford can initiate purse lipped & diaphragmatic breathing when he becomes short of breath. He can also self-pace with activity when he becomes short of breath. These goals have been met by the patient.     Expected Outcomes See Admission goals -- See Admission goals              Core Components/Risk Factors/Patient Goals at Discharge (Final Review):   Goals and Risk Factor Review - 02/11/23 1046       Core Components/Risk Factors/Patient Goals Review   Personal Goals Review Weight Management/Obesity;Improve shortness of breath with ADL's;Develop more efficient breathing techniques such as purse lipped breathing and diaphragmatic breathing and practicing self-pacing with activity.    Review Battal is working towards his goal of weight loss. He is currently working with our dietician.  He has not made any progress towards his goal. Jakarie is also working on improving his shortness of breath with his ADLs. Quinston stated that he is slowly building stamina and can see a difference when he bathes. Baldemar can initiate purse lipped & diaphragmatic breathing when he becomes short of breath. He can also self-pace with activity when he becomes short of breath. These goals have been met by the patient.    Expected Outcomes See Admission goals             ITP Comments: Pt is making expected progress toward Pulmonary Rehab goals after completing 9 sessions. Recommend continued exercise, life style modification, education, and utilization of breathing techniques to increase stamina and strength, while also decreasing shortness of breath with exertion.    Comments: Dr. Mechele Collin is Medical Director for Pulmonary Rehab at [facility].

## 2023-02-21 ENCOUNTER — Other Ambulatory Visit: Payer: Self-pay | Admitting: Family Medicine

## 2023-02-21 ENCOUNTER — Encounter (HOSPITAL_COMMUNITY)
Admission: RE | Admit: 2023-02-21 | Discharge: 2023-02-21 | Disposition: A | Discharge status: Home / Self Care | Payer: Medicare Other | Source: Ambulatory Visit | Attending: Cardiology | Admitting: Cardiology

## 2023-02-21 DIAGNOSIS — I272 Pulmonary hypertension, unspecified: Secondary | ICD-10-CM | POA: Diagnosis not present

## 2023-02-21 DIAGNOSIS — K219 Gastro-esophageal reflux disease without esophagitis: Secondary | ICD-10-CM

## 2023-02-21 NOTE — Progress Notes (Signed)
Daily Session Note  Patient Details  Name: Keith [name] MRN: [id number] Date of Birth: 08-04-1951 Referring Provider:   Doristine Devoid Pulmonary Rehab Walk Test from 01/14/2023 in [facility] for Heart, Vascular, & Lung Health  Referring Provider [name]       Encounter Date: 02/21/2023  Check In:  Session Check In - 02/21/23 1154       Check-In   Supervising physician immediately available to respond to emergencies [facility] MD immediately available    Physician(s) Keith Bridegroom, NP    Location [facility]    Staff Present Keith [name], RD, Keith Gray, RN, BSN;Keith [name] BS, ACSM-CEP, Exercise Physiologist;Keith [name] Keith Plater, MS, ACSM-CEP, Exercise Physiologist;Keith [name], Keith Bracken, RN, BSN    Virtual Visit No    Medication changes reported     No    Fall or balance concerns reported    No    Tobacco Cessation No Change    Warm-up and Cool-down Performed as group-led instruction    Resistance Training Performed Yes    VAD Patient? No    PAD/SET Patient? No      Pain Assessment   Currently in Pain? Yes    Pain Score 2     Pain Location Hip    Pain Orientation Right    Pain Descriptors / Indicators Aching    Pain Onset More than a month ago    Pain Frequency Intermittent    Multiple Pain Sites No             Capillary Blood Glucose: No results found for this or any previous visit (from the past 24 hour(s)).    Social History   Tobacco Use  Smoking Status Former   Packs/day: 1.00   Years: 25.00   Additional pack years: 0.00   Total pack years: 25.00   Types: Cigarettes   Quit date: 16   Years since quitting: 29.3   Passive exposure: Never  Smokeless Tobacco Never    Goals Met:  Proper associated with RPD/PD & O2 Sat Exercise tolerated well No report of concerns or symptoms today Strength training completed today  Goals Unmet:  Not Applicable  Comments: Service time is from 1010 to 1150.     Dr. Mechele Collin is Medical Director for Pulmonary Rehab at [facility].

## 2023-02-26 ENCOUNTER — Encounter (HOSPITAL_COMMUNITY)
Admission: RE | Admit: 2023-02-26 | Discharge: 2023-02-26 | Disposition: A | Discharge status: Home / Self Care | Payer: Medicare Other | Source: Ambulatory Visit | Attending: Cardiology | Admitting: Cardiology

## 2023-02-26 DIAGNOSIS — I272 Pulmonary hypertension, unspecified: Secondary | ICD-10-CM | POA: Diagnosis not present

## 2023-02-26 NOTE — Progress Notes (Signed)
Daily Session Note  Patient Details  Name: Keith [name] MRN: [id number] Date of Birth: 09-12-1951 Referring Provider:   Doristine Devoid Pulmonary Rehab Walk Test from 01/14/2023 in [facility] for Heart, Vascular, & Lung Health  Referring Provider [name]       Encounter Date: 02/26/2023  Check In:  Session Check In - 02/26/23 1136       Check-In   Supervising physician immediately available to respond to emergencies [facility] MD immediately available    Physician(s) Bernadene Person, NP    Location [facility]    Staff Present Samantha Belarus, RD, Dutch Gray, RN, BSN;Randi Dionisio Paschal, ACSM-CEP, Exercise Physiologist;Carlette Les Pou, RN, Doris Cheadle, MS, ACSM-CEP, Exercise Physiologist;Rochelle Nephew Katrinka Blazing, RT    Virtual Visit No    Medication changes reported     No    Fall or balance concerns reported    No    Tobacco Cessation No Change    Warm-up and Cool-down Performed as group-led instruction    Resistance Training Performed Yes    VAD Patient? No    PAD/SET Patient? No      Pain Assessment   Currently in Pain? Yes    Pain Score 5     Pain Location Hip    Pain Orientation Right    Pain Descriptors / Indicators Aching    Pain Type Neuropathic pain    Pain Onset More than a month ago    Pain Frequency Intermittent    Multiple Pain Sites No             Capillary Blood Glucose: No results found for this or any previous visit (from the past 24 hour(s)).    Social History   Tobacco Use  Smoking Status Former   Packs/day: 1.00   Years: 25.00   Additional pack years: 0.00   Total pack years: 25.00   Types: Cigarettes   Quit date: 59   Years since quitting: 29.4   Passive exposure: Never  Smokeless Tobacco Never    Goals Met:  Proper associated with RPD/PD & O2 Sat Independence with exercise equipment Exercise tolerated well No report of concerns or symptoms today Strength training completed today  Goals Unmet:   Not Applicable  Comments: Service time is from 1023 to 1155.    Dr. Mechele Collin is Medical Director for Pulmonary Rehab at [facility].

## 2023-02-28 ENCOUNTER — Encounter (HOSPITAL_COMMUNITY)
Admission: RE | Admit: 2023-02-28 | Discharge: 2023-02-28 | Disposition: A | Discharge status: Home / Self Care | Payer: Medicare Other | Source: Ambulatory Visit | Attending: Cardiology | Admitting: Cardiology

## 2023-02-28 ENCOUNTER — Other Ambulatory Visit: Payer: Self-pay | Admitting: Physical Medicine and Rehabilitation

## 2023-02-28 DIAGNOSIS — I272 Pulmonary hypertension, unspecified: Secondary | ICD-10-CM | POA: Diagnosis not present

## 2023-02-28 NOTE — Progress Notes (Signed)
Daily Session Note  Patient Details  Name: Keith [name] MRN: [id number] Date of Birth: December 03, 1950 Referring Provider:   Doristine Devoid Pulmonary Rehab Walk Test from 01/14/2023 in [facility]  Referring Provider [name]       Encounter Date: 02/28/2023  Check In:  Session Check In - 02/28/23 1301       Check-In   Supervising physician immediately available to respond to emergencies [facility] MD immediately available    Physician(s) Bernadene Person, NP    Location [facility]    Staff Present Samantha Belarus, RD, Dutch Gray, RN, BSN;Randi Dionisio Paschal, ACSM-CEP, Exercise Physiologist;Carlette Les Pou, RN, Doris Cheadle, MS, ACSM-CEP, Exercise Physiologist;Maree Ainley Katrinka Blazing, RT    Virtual Visit No    Medication changes reported     No    Fall or balance concerns reported    No    Tobacco Cessation No Change    Warm-up and Cool-down Performed as group-led instruction    Resistance Training Performed Yes    VAD Patient? No    PAD/SET Patient? No      Pain Assessment   Currently in Pain? No/denies    Multiple Pain Sites No             Capillary Blood Glucose: No results found for this or any previous visit (from the past 24 hour(s)).    Social History   Tobacco Use  Smoking Status Former   Packs/day: 1.00   Years: 25.00   Additional pack years: 0.00   Total pack years: 25.00   Types: Cigarettes   Quit date: 68   Years since quitting: 29.4   Passive exposure: Never  Smokeless Tobacco Never    Goals Met:  Proper associated with RPD/PD & O2 Sat Independence with exercise equipment Exercise tolerated well No report of concerns or symptoms today Strength training completed today  Goals Unmet:  Not Applicable  Comments: Service time is from 1010 to 1153.    Dr. Mechele Collin is Medical Director for Pulmonary Rehab at [facility]

## 2023-03-01 ENCOUNTER — Other Ambulatory Visit: Payer: Self-pay | Admitting: Family Medicine

## 2023-03-01 DIAGNOSIS — D509 Iron deficiency anemia, unspecified: Secondary | ICD-10-CM

## 2023-03-05 ENCOUNTER — Encounter (HOSPITAL_COMMUNITY)
Admission: RE | Admit: 2023-03-05 | Discharge: 2023-03-05 | Disposition: A | Discharge status: Home / Self Care | Payer: Medicare Other | Source: Ambulatory Visit | Attending: Cardiology | Admitting: Cardiology

## 2023-03-05 VITALS — Wt 265.4 lb

## 2023-03-05 DIAGNOSIS — I272 Pulmonary hypertension, unspecified: Secondary | ICD-10-CM

## 2023-03-05 NOTE — Progress Notes (Signed)
Daily Session Note  Patient Details  Name: TAGE SCHOLZE MRN: [id number] Date of Birth: [birth year] Referring Provider:   Doristine Devoid Pulmonary Rehab Walk Test from 01/14/2023 in [facility]  Referring Provider [name]       Encounter Date: 03/05/2023  Check In:  Session Check In - 03/05/23 1130       Check-In   Supervising physician immediately available to respond to emergencies [facility] MD immediately available    Physician(s) Robin Searing, NP    [facility]    Staff Present Samantha Belarus, RD, Dutch Gray, RN, BSN;Randi Dionisio Paschal, ACSM-CEP, Exercise Physiologist;Carlette Les Pou, RN, Doris Cheadle, MS, ACSM-CEP, Exercise Physiologist;Casey Katrinka Blazing, RT    Virtual Visit No    Medication changes reported     No    Fall or balance concerns reported    No    Tobacco Cessation No Change    Warm-up and Cool-down Performed as group-led instruction    Resistance Training Performed Yes    VAD Patient? No    PAD/SET Patient? No      Pain Assessment   Currently in Pain? No/denies    Multiple Pain Sites No             Capillary Blood Glucose: No results found for this or any previous visit (from the past 24 hour(s)).   Exercise Prescription Changes - 03/05/23 1200       Response to Exercise   Blood Pressure (Admit) 128/64    Blood Pressure (Exercise) 126/64    Blood Pressure (Exit) 100/58    Heart Rate (Admit) 95 bpm    Heart Rate (Exercise) 108 bpm    Heart Rate (Exit) 96 bpm    Oxygen Saturation (Admit) 97 %    Oxygen Saturation (Exercise) 93 %    Oxygen Saturation (Exit) 96 %    Rating of Perceived Exertion (Exercise) 11    Perceived Dyspnea (Exercise) 0    Duration Continue with 30 min of aerobic exercise without signs/symptoms of physical distress.    Intensity THRR unchanged      Progression   Progression Continue to progress workloads to maintain intensity without  signs/symptoms of physical distress.      Resistance Training   Training Prescription Yes    Weight blue bands    Reps 10-15    Time 10 Minutes      T5 Nustep   Level 3    Minutes 15    METs 2             Social History   Tobacco Use  Smoking Status Former   Packs/day: 1.00   Years: 25.00   Additional pack years: 0.00   Total pack years: 25.00   Types: Cigarettes   Quit date: 44   Years since quitting: 29.4   Passive exposure: Never  Smokeless Tobacco Never    Goals Met:  Independence with exercise equipment Improved SOB with ADL's Exercise tolerated well No report of concerns or symptoms today Strength training completed today  Goals Unmet:  Not Applicable  Comments: Service time is from 1004 to 1132    Dr. Mechele Collin is Medical Director for Pulmonary Rehab at [facility].

## 2023-03-07 ENCOUNTER — Encounter (HOSPITAL_COMMUNITY)
Admission: RE | Admit: 2023-03-07 | Discharge: 2023-03-07 | Disposition: A | Discharge status: Home / Self Care | Payer: Medicare Other | Source: Ambulatory Visit | Attending: Cardiology | Admitting: Cardiology

## 2023-03-07 DIAGNOSIS — I272 Pulmonary hypertension, unspecified: Secondary | ICD-10-CM | POA: Diagnosis not present

## 2023-03-07 NOTE — Progress Notes (Addendum)
Daily Session Note  Patient Details  Name: Keith [name] MRN: [id number] Date of Birth: 1951/06/07 Referring Provider:   Doristine Devoid Pulmonary Rehab Walk Test from 01/14/2023 in [facility]  Referring Provider [name]       Encounter Date: 03/07/2023  Check In:  Session Check In - 03/07/23 1209       Check-In   Supervising physician immediately available to respond to emergencies [facility] MD immediately available    Physician(s) Edd Fabian    [facility]    Staff Present Samantha Belarus, RD, Dutch Gray, RN, BSN;Randi Reeve BS, ACSM-CEP, Exercise Physiologist;Kaylee Earlene Plater, MS, ACSM-CEP, Exercise Physiologist;Elaijah Munoz Chester Holstein, MS, Exercise Physiologist    Virtual Visit No    Medication changes reported     No    Fall or balance concerns reported    No    Tobacco Cessation No Change    Warm-up and Cool-down Performed as group-led instruction    Resistance Training Performed Yes    VAD Patient? No    PAD/SET Patient? No      Pain Assessment   Currently in Pain? Yes    Pain Score 4     Pain Location Hip    Pain Orientation Right    Pain Descriptors / Indicators Aching    Pain Onset More than a month ago    Pain Frequency Intermittent    Multiple Pain Sites No             Capillary Blood Glucose: No results found for this or any previous visit (from the past 24 hour(s)).    Social History   Tobacco Use  Smoking Status Former   Packs/day: 1.00   Years: 25.00   Additional pack years: 0.00   Total pack years: 25.00   Types: Cigarettes   Quit date: 37   Years since quitting: 29.4   Passive exposure: Never  Smokeless Tobacco Never    Goals Met:  Proper associated with RPD/PD & O2 Sat Independence with exercise equipment Exercise tolerated well No report of concerns or symptoms today Strength training completed today  Goals Unmet:  Not  Applicable  Comments: Service time is from 1005 to 1145.    Dr. Mechele Collin is Medical Director for Pulmonary Rehab at [facility].

## 2023-03-11 ENCOUNTER — Telehealth: Payer: Self-pay

## 2023-03-11 NOTE — Progress Notes (Signed)
Patient ID: Keith [name], male   DOB: 1951/04/13, 72 y.o.   MRN: [id number]   Care Management & Coordination Services Pharmacy Team  Reason for Encounter: Hypertension  Contacted patient to discuss hypertension disease state. Spoke with patient on 03/15/2023     Current antihypertensive regimen:  Amlodipine 10mg  1 tab daily Carvedilol 12.5mg  1 tab BID Spironolactone 25mg  1 tab qd  Torsemide 20mg  1 tab qd   Patient verbally confirms he is taking the above medications as directed. Yes  How often are you checking your Blood Pressure? daily  he checks his blood pressure in the afternoon after taking his medication.  Current home BP readings: 114/60,120/70  Patent reports he is also being checked frequently while at pulmonary rehab.  Any readings above 180/100? No If yes any symptoms of hypertensive emergency? patient denies any symptoms of high blood pressure  What recent interventions/DTPs have been made by any provider to improve Blood Pressure control since last CPP Visit: Patient reports none  Any recent hospitalizations or ED visits since last visit with CPP? No  What exercise is being done to improve your Blood Pressure Control?  Patient reports he has been participating in pulmonary rehab, and has noticed his left foot and leg is full of fluid and swollen constantly. He reports no pain, redness or warmth in the are, forwarded to [facility] so PCP for follow up this month with PCP.  Adherence Review: Is the patient currently on ACE/ARB medication? No Does the patient have >5 day gap between last estimated fill dates? No  Star Rating Drugs:  Rosuvastatin 20 mg - Last filled 01/31/23 90 DS at [facility] Mounjaro 5 mg - Last filled 02/18/23 28 DS at [facility]  Chart Updates: Recent office visits:  01/24/23 Terressa Koyanagi, DO - Patient presented for Medicare Annual Wellness Exam. No medication changes.   12/14/22 [place], [name], MD - Patient presented for GERD unspecified whether  esophagitis present and other concerns. Prescribed Omeprazole. Increased Riociguat.   Recent consult visits:  01/17/23 Quintella Reichert, MD (Cardiology) - Patient presented for OSA and other concerns. No medication changes.   01/14/23 Patient presented to [facility] for Pulmonary Rehab Walk Test.  01/02/23 Supple, Emeline Darling, RPH-CPP - Patient presented for Type 2 diabetes mellitus with diabetic neuropathy without long term current use of insulin and other concerns. Mounjaro started.  12/24/22 Laurey Morale, MD (Cardiology) - Patient presented for Chronic diastolic heart failure and other concerns. No medication changes.  12/06/22 Rice, Jamesetta Orleans, MD (Rheumatology) - Patient presented for Chronic pulmonary hypertension and other concerns. Increased Gabapentin.   11/27/22 Knox Royalty - Claims encounter for history of falling and other concerns. No other visit details available.   Hospital visits:  None in previous 6 months  Medications: Outpatient Encounter Medications as of 03/11/2023  Medication Sig   acetaminophen (TYLENOL) 325 MG tablet Take 1-2 tablets (325-650 mg total) by mouth every 4 (four) hours as needed for mild pain.   albuterol (VENTOLIN HFA) 108 (90 Base) MCG/ACT inhaler Inhale 2 puffs into the lungs every 6 (six) hours as needed for wheezing or shortness of breath.   amLODipine (NORVASC) 10 MG tablet TAKE 1 TABLET BY MOUTH DAILY   apixaban (ELIQUIS) 5 MG TABS tablet Take 1 tablet (5 mg total) by mouth 2 (two) times daily.   Blood Glucose Monitoring Suppl (ACCU-CHEK GUIDE ME) w/Device KIT USE TO TEST BLOOD SUGARS 1-2 TIMES DAILY.   carvedilol (COREG) 12.5 MG  tablet Take 1 tablet (12.5 mg total) by mouth 2 (two) times daily with a meal.   cyanocobalamin (VITAMIN B12) 1000 MCG tablet Take 1,000 mcg by mouth daily.   DULoxetine (CYMBALTA) 60 MG capsule TAKE 1 CAPSULE BY MOUTH EVERY DAY   ferrous sulfate 325 (65 FE) MG tablet TAKE 1 TABLET BY MOUTH EVERY DAY WITH  BREAKFAST   gabapentin (NEURONTIN) 400 MG capsule Take 400 mg by mouth 3 (three) times daily.   HYDROcodone-acetaminophen (NORCO) 7.5-325 MG tablet Take 1 tablet by mouth in the morning, at noon, in the evening, and at bedtime. 7.5 mg/375 mg   hydrocortisone (ANUSOL-HC) 2.5 % rectal cream Place 1 application. rectally as needed for hemorrhoids or anal itching.   levETIRAcetam (KEPPRA) 250 MG tablet Take 1 tablet (250 mg total) by mouth 2 (two) times daily.   lidocaine (LIDODERM) 5 % Place 1 patch onto the skin daily as needed (pain). Remove & Discard patch within 12 hours or as directed by MD   naloxone (NARCAN) nasal spray 4 mg/0.1 mL Place 1 spray into the nose as needed (accidental overdose).   omeprazole (PRILOSEC) 40 MG capsule TAKE 1 CAPSULE BY MOUTH DAILY   polyethylene glycol (MIRALAX / GLYCOLAX) 17 g packet Take 17 g by mouth daily as needed for mild constipation.   Riociguat (ADEMPAS) 2 MG TABS Take 1 tablet by mouth. In the morning, at noon, and at bedtime.   rOPINIRole (REQUIP) 1 MG tablet Take 1 tablet (1 mg total) by mouth at bedtime.   rosuvastatin (CRESTOR) 20 MG tablet Take 1 tablet (20 mg total) by mouth daily.   spironolactone (ALDACTONE) 25 MG tablet Take 1 tablet (25 mg total) by mouth daily.   tirzepatide [facility]) 5 MG/0.5ML Pen Inject 5 mg into the skin once a week.   torsemide (DEMADEX) 20 MG tablet TAKE 1 AND 1/2 TABLETS DAILY BY MOUTH   traZODone (DESYREL) 50 MG tablet Take 0.5-1 tablets (25-50 mg total) by mouth at bedtime as needed. for sleep   No facility-administered encounter medications on file as of 03/11/2023.    Recent Office Vitals: BP Readings from Last 3 Encounters:  01/24/23 115/65  01/17/23 118/65  01/14/23 122/62   Pulse Readings from Last 3 Encounters:  01/17/23 95  01/14/23 94  12/24/22 96    Wt Readings from Last 3 Encounters:  03/05/23 265 lb 6.9 oz (120.4 kg)  02/19/23 257 lb 15 oz (117 kg)  02/05/23 267 lb 3.2 oz (121.2 kg)      Kidney Function Lab Results  Component Value Date/Time   CREATININE 1.36 (H) 12/24/2022 09:34 AM   CREATININE 1.03 11/02/2022 02:28 PM   CREATININE 1.70 (H) 03/09/2022 03:42 PM   GFR 73.71 11/02/2022 02:28 PM   GFRNONAA 56 (L) 12/24/2022 09:34 AM   GFRAA >60 07/11/2020 06:19 AM       Latest Ref Rng & Units 12/24/2022    9:34 AM 11/02/2022    2:28 PM 10/25/2022    6:34 AM  BMP  Glucose 70 - 99 mg/dL 161  096  045   BUN 8 - 23 mg/dL 21  15  32   Creatinine 0.61 - 1.24 mg/dL 4.09  8.11  9.14   Sodium 135 - 145 mmol/L 139  139  139   Potassium 3.5 - 5.1 mmol/L 4.1  3.8  4.5   Chloride 98 - 111 mmol/L 110  103  107   CO2 22 - 32 mmol/L 22  27  22  Calcium 8.9 - 10.3 mg/dL 9.6  16.1  9.9        Pamala Duffel [facility] Clinical Pharmacist Assistant [phone]

## 2023-03-12 ENCOUNTER — Encounter (HOSPITAL_COMMUNITY)
Admission: RE | Admit: 2023-03-12 | Discharge: 2023-03-12 | Disposition: A | Discharge status: Home / Self Care | Payer: Medicare Other | Source: Ambulatory Visit | Attending: Cardiology | Admitting: Cardiology

## 2023-03-12 DIAGNOSIS — I272 Pulmonary hypertension, unspecified: Secondary | ICD-10-CM | POA: Insufficient documentation

## 2023-03-12 NOTE — Progress Notes (Signed)
Home Exercise Prescription I have reviewed a Home Exercise Prescription with Keith [name] is currently exercising at home. He uses the recumbent stepper 3 days/wk for 60 min/day. I told Keith [name] I was satisfied with his home exercise. He mentioned that his stepper can change in resistance. I encouraged him to increase resistance so that he can build endurance. He agreed with my recommendations. Keith [name] is very motivated to exercise at home. I am confident in Keith [name] carrying out an exercise regimen at home. The patient stated that their goals were to walk unassisted. We reviewed exercise guidelines, target heart rate during exercise, RPE Scale, weather conditions, endpoints for exercise, warmup and cool down. The patient is encouraged to come to me with any questions. I will continue to follow up with the patient to assist them with progression and safety. Spent 15 min with patient discussing home exercise plan and goals  Joya San, MS, ACSM-CEP 03/12/2023 3:16 PM

## 2023-03-12 NOTE — Progress Notes (Signed)
Daily Session Note  Patient Details  Name: Keith [name] MRN: [id number] Date of Birth: 1951/05/30 Referring Provider:   Doristine [name] Pulmonary Rehab Walk Test from 01/14/2023 in [name]  Referring Provider Keith [name]       Encounter Date: 03/12/2023  Check In:  Session Check In - 03/12/23 1121       Check-In   Supervising physician immediately available to respond to emergencies [facility] MD immediately available    Physician(s) Keith Shadow, NP    Location [facility]    Staff Present Keith [name], RD, LDN;Keith [name], ACSM-CEP, Exercise Physiologist;Keith Les Pou, RN, Keith Cheadle, MS, ACSM-CEP, Exercise Physiologist;Keith [name], RT    Virtual Visit No    Medication changes reported     No    Fall or balance concerns reported    No    Tobacco Cessation No Change    Warm-up and Cool-down Performed as group-led instruction    Resistance Training Performed Yes    VAD Patient? No    PAD/SET Patient? No      Pain Assessment   Currently in Pain? No/denies    Multiple Pain Sites No             Capillary Blood Glucose: No results found for this or any previous visit (from the past 24 hour(s)).    Social History   Tobacco Use  Smoking Status Former   Packs/day: 1.00   Years: 25.00   Additional pack years: 0.00   Total pack years: 25.00   Types: Cigarettes   Quit date: 26   Years since quitting: 29.4   Passive exposure: Never  Smokeless Tobacco Never    Goals Met:  Proper associated with RPD/PD & O2 Sat Independence with exercise equipment Exercise tolerated well No report of concerns or symptoms today Strength training completed today  Goals Unmet:  Not Applicable  Comments: Service time is from 1004 to 1150.    Dr. Mechele [name] is Medical Director for Pulmonary Rehab at [name].

## 2023-03-14 ENCOUNTER — Other Ambulatory Visit (HOSPITAL_BASED_OUTPATIENT_CLINIC_OR_DEPARTMENT_OTHER): Payer: Self-pay

## 2023-03-14 ENCOUNTER — Emergency Department (HOSPITAL_BASED_OUTPATIENT_CLINIC_OR_DEPARTMENT_OTHER): Payer: Medicare Other | Admitting: Radiology

## 2023-03-14 ENCOUNTER — Emergency Department (HOSPITAL_BASED_OUTPATIENT_CLINIC_OR_DEPARTMENT_OTHER)
Admission: EM | Admit: 2023-03-14 | Discharge: 2023-03-14 | Disposition: A | Discharge status: Home / Self Care | Payer: Medicare Other | Attending: Emergency Medicine | Admitting: Emergency Medicine

## 2023-03-14 ENCOUNTER — Encounter (HOSPITAL_BASED_OUTPATIENT_CLINIC_OR_DEPARTMENT_OTHER): Payer: Self-pay

## 2023-03-14 ENCOUNTER — Encounter (HOSPITAL_COMMUNITY)
Admission: RE | Admit: 2023-03-14 | Discharge: 2023-03-14 | Disposition: A | Discharge status: Home / Self Care | Payer: Medicare Other | Source: Ambulatory Visit | Attending: Cardiology | Admitting: Cardiology

## 2023-03-14 ENCOUNTER — Other Ambulatory Visit: Payer: Self-pay

## 2023-03-14 DIAGNOSIS — Z79899 Other long term (current) drug therapy: Secondary | ICD-10-CM | POA: Insufficient documentation

## 2023-03-14 DIAGNOSIS — M7022 Olecranon bursitis, left elbow: Secondary | ICD-10-CM | POA: Diagnosis not present

## 2023-03-14 DIAGNOSIS — W2209XA Striking against other stationary object, initial encounter: Secondary | ICD-10-CM | POA: Diagnosis not present

## 2023-03-14 DIAGNOSIS — I509 Heart failure, unspecified: Secondary | ICD-10-CM | POA: Diagnosis not present

## 2023-03-14 DIAGNOSIS — Y92002 Bathroom of unspecified non-institutional (private) residence single-family (private) house as the place of occurrence of the external cause: Secondary | ICD-10-CM | POA: Insufficient documentation

## 2023-03-14 DIAGNOSIS — E119 Type 2 diabetes mellitus without complications: Secondary | ICD-10-CM | POA: Diagnosis not present

## 2023-03-14 DIAGNOSIS — M25522 Pain in left elbow: Secondary | ICD-10-CM | POA: Diagnosis present

## 2023-03-14 DIAGNOSIS — I110 Hypertensive heart disease with heart failure: Secondary | ICD-10-CM | POA: Insufficient documentation

## 2023-03-14 DIAGNOSIS — Y939 Activity, unspecified: Secondary | ICD-10-CM | POA: Insufficient documentation

## 2023-03-14 HISTORY — DX: Heart failure, unspecified: I50.9

## 2023-03-14 NOTE — Discharge Instructions (Signed)
You have been seen today for your complaint of left elbow pain. Your discharge medications include your home pain medication. Home care instructions are as follows:  Ice the area multiple times a day Follow up with: Dr. Blanchie Dessert.  He is an Investment banker, operational.  Call to schedule an appointment for follow-up visit Please seek immediate medical care if you develop any of the following symptoms: You have trouble moving your arm, hand, or fingers. At this time there does not appear to be the presence of an emergent medical condition, however there is always the potential for conditions to change. Please read and follow the below instructions.  Do not take your medicine if  develop an itchy rash, swelling in your mouth or lips, or difficulty breathing; call 911 and seek immediate emergency medical attention if this occurs.  You may review your lab tests and imaging results in their entirety on your MyChart account.  Please discuss all results of fully with your primary care provider and other specialist at your follow-up visit.  Note: Portions of this text may have been transcribed using voice recognition software. Every effort was made to ensure accuracy; however, inadvertent computerized transcription errors may still be present.

## 2023-03-14 NOTE — ED Notes (Signed)
X-ray at bedside

## 2023-03-14 NOTE — ED Notes (Signed)
ED Provider at bedside. 

## 2023-03-14 NOTE — ED Provider Notes (Signed)
[place] EMERGENCY DEPARTMENT AT [facility] Provider Note   CSN: [id number] Arrival date & time: 03/14/23  4098     History  Chief Complaint  Patient presents with   Joint Swelling   Headache    Keith [name] is a 72 y.o. male.  With a significant history including but not limited to hypertension, osteoarthritis, spinal stenosis, CHF, diabetes, chronic low back pain on chronic opioid therapy, obesity who presents to the ED for evaluation of left elbow pain.  He states he was in the bathroom 2 weeks ago and when he transferred himself from his commode to his wheelchair he hit his elbow on the counter.  He has had swelling and pain since that time.  He states he is currently in physical therapy and has to use the arm bicycle machine at therapy which has been making his elbow pain worse.  He denies numbness, weakness or tingling of either upper extremity.  He denies left wrist or shoulder pain.  No fevers, chills, abdominal pain, nausea or vomiting.  He states his orthopedic provider has recently retired and he does not have another 1 to see.  Patient also has chronic low back pain and right hip pain.  No recent changes to these.  HPI     Home Medications Prior to Admission medications   Medication Sig Start Date End Date Taking? Authorizing Provider  gabapentin (NEURONTIN) 600 MG tablet Take 600 mg by mouth 3 (three) times daily. 02/20/23  Yes [provider]  acetaminophen (TYLENOL) 325 MG tablet Take 1-2 tablets (325-650 mg total) by mouth every 4 (four) hours as needed for mild pain. 09/06/21   Love, Evlyn Kanner, PA-C  albuterol (VENTOLIN HFA) 108 (90 Base) MCG/ACT inhaler Inhale 2 puffs into the lungs every 6 (six) hours as needed for wheezing or shortness of breath. 10/25/22   Amin, Loura Halt, MD  amLODipine (NORVASC) 10 MG tablet TAKE 1 TABLET BY MOUTH DAILY 02/04/23   [place], [name], MD  apixaban (ELIQUIS) 5 MG TABS tablet Take 1 tablet (5 mg total) by mouth 2  (two) times daily. 03/09/22   Rhetta Mura, MD  Blood Glucose Monitoring Suppl (ACCU-CHEK GUIDE ME) w/Device KIT USE TO TEST BLOOD SUGARS 1-2 TIMES DAILY. 03/06/22   [place], [name], MD  carvedilol (COREG) 12.5 MG tablet Take 1 tablet (12.5 mg total) by mouth 2 (two) times daily with a meal. 11/30/22   Laurey Morale, MD  cyanocobalamin (VITAMIN B12) 1000 MCG tablet Take 1,000 mcg by mouth daily.    [provider]  DULoxetine (CYMBALTA) 60 MG capsule TAKE 1 CAPSULE BY MOUTH EVERY DAY 12/04/22   Lovorn, Aundra Millet, MD  ferrous sulfate 325 (65 FE) MG tablet TAKE 1 TABLET BY MOUTH EVERY DAY WITH BREAKFAST 03/01/23   [place], [name], MD  gabapentin (NEURONTIN) 400 MG capsule Take 400 mg by mouth 3 (three) times daily. 11/05/22   [provider]  HYDROcodone-acetaminophen (NORCO) 7.5-325 MG tablet Take 1 tablet by mouth in the morning, at noon, in the evening, and at bedtime. 7.5 mg/375 mg    [provider]  hydrocortisone (ANUSOL-HC) 2.5 % rectal cream Place 1 application. rectally as needed for hemorrhoids or anal itching.    [provider]  levETIRAcetam (KEPPRA) 250 MG tablet Take 1 tablet (250 mg total) by mouth 2 (two) times daily. 12/04/22   Lovorn, Aundra Millet, MD  lidocaine (LIDODERM) 5 % Place 1 patch onto the skin daily as needed (pain). Remove &  Discard patch within 12 hours or as directed by MD    [provider]  naloxone (NARCAN) nasal spray 4 mg/0.1 mL Place 1 spray into the nose as needed (accidental overdose). 09/06/21   [provider]  omeprazole (PRILOSEC) 40 MG capsule TAKE 1 CAPSULE BY MOUTH DAILY 02/22/23   [place], [name], MD  polyethylene glycol (MIRALAX / GLYCOLAX) 17 g packet Take 17 g by mouth daily as needed for mild constipation. 09/06/21   Love, Evlyn Kanner, PA-C  rOPINIRole (REQUIP) 1 MG tablet Take 1 tablet (1 mg total) by mouth at bedtime. 11/07/22   [place], [name], MD  rosuvastatin (CRESTOR) 20 MG tablet Take 1 tablet (20 mg  total) by mouth daily. 11/07/22   [place], [name], MD  spironolactone (ALDACTONE) 25 MG tablet Take 1 tablet (25 mg total) by mouth daily. 11/30/22   Laurey Morale, MD  tirzepatide [facility]) 5 MG/0.5ML Pen Inject 5 mg into the skin once a week. 02/15/23   Laurey Morale, MD  torsemide (DEMADEX) 20 MG tablet TAKE 1 AND 1/2 TABLETS DAILY BY MOUTH 01/30/23   Laurey Morale, MD  traZODone (DESYREL) 50 MG tablet Take 0.5-1 tablets (25-50 mg total) by mouth at bedtime as needed. for sleep 12/04/22   Genice Rouge, MD      Allergies    Aspirin and Lisinopril    Review of Systems   Review of Systems  All other systems reviewed and are negative.   Physical Exam Updated Vital Signs BP (!) 97/58   Pulse 98   Temp 98.1 F (36.7 C) (Oral)   Resp 18   Ht 6' (1.829 m)   Wt 120.7 kg   SpO2 94%   BMI 36.08 kg/m  Physical Exam Vitals and nursing note reviewed.  Constitutional:      General: He is not in acute distress.    Appearance: Normal appearance. He is normal weight. He is not ill-appearing.     Comments: Resting comfortably in bed  HENT:     Head: Normocephalic and atraumatic.  Pulmonary:     Effort: Pulmonary effort is normal. No respiratory distress.  Abdominal:     General: Abdomen is flat.  Musculoskeletal:        General: Normal range of motion.     Cervical back: Neck supple.     Comments: Swelling of the left olecranon without overlying warmth or erythema. Full AROM of left upper extremity. Grip strength 5/5.  Skin:    General: Skin is warm and dry.  Neurological:     Mental Status: He is alert and oriented to person, place, and time.  Psychiatric:        Mood and Affect: Mood normal.        Behavior: Behavior normal.     ED Results / Procedures / Treatments   Labs (all labs ordered are listed, but only abnormal results are displayed) Labs Reviewed - No data to display  EKG None  Radiology DG Elbow Complete Left  Result Date: 03/14/2023 CLINICAL DATA:   Olecranon bursitis.  Swelling and pain EXAM: LEFT ELBOW - COMPLETE 4 VIEW COMPARISON:  None Available. FINDINGS: No acute fracture or dislocation. Preserved joint spaces and bone mineralization. No joint effusion. There is an ossific focus seen posterior but the elbow near the tip of the olecranon. This well corticated. Please correlate for any history of old injury but this has age-indeterminate. There is some hyperostosis along the olecranon. Soft tissue thickening as  well adjacent. Again this could relate to fluid in the bursa. Please correlate with the history IMPRESSION: Dorsal soft tissue swelling about the elbow. Possible fluid in the bursa as per history. There is a well corticated ossific density adjacent to the tip of the olecranon. Please correlate for any history including previous injury. This has age-indeterminate. Electronically Signed   By: Karen Kays M.D.   On: 03/14/2023 11:18    Procedures Procedures    Medications Ordered in ED Medications - No data to display  ED Course/ Medical Decision Making/ A&P                             Medical Decision Making Amount and/or Complexity of Data Reviewed Radiology: ordered.  This patient presents to the ED for concern of left elbow pain, this involves an extensive number of treatment options, and is a complaint that carries with it a high risk of complications and morbidity.  The differential diagnosis includes fracture, strain, sprain, contusion, dislocation, bursitis, septic arthritis  Co morbidities that complicate the patient evaluation  significant history including but not limited to hypertension, osteoarthritis, spinal stenosis, CHF, diabetes, chronic low back pain on chronic opioid therapy, obesity  My initial workup includes x-ray left elbow  Additional history obtained from: Nursing notes from this visit.  I ordered imaging studies including x-ray left elbow I independently visualized and interpreted imaging which  showed left elbow swelling consistent with bursitis, small ossific density I agree with the radiologist interpretation  Afebrile, hemodynamically stable.  72 year old male presenting to the ED for evaluation of left elbow pain.  He hit his elbow on the bathroom counter 2 weeks ago.  Pain has been aggravated by upper extremity movement at physical therapy recently.  His neurovascular status is intact.  There is no warmth or erythema of the left elbow.  There is some swelling consistent with olecranon bursitis.  X-ray negative for acute fracture, however does show a small ossific density.  This may be secondary to bone spur fracture.  Overall suspect olecranon bursitis.  He was offered a sling for comfort but declined.  He was given contact information for orthopedics and encouraged to follow-up.  He was also complaining of right hip and low back pain.  States this is a chronic issue.  Denies recent changes.  Declines imaging of these body areas.  He was given return precautions.  Stable at discharge.  At this time there does not appear to be any evidence of an acute emergency medical condition and the patient appears stable for discharge with appropriate outpatient follow up. Diagnosis was discussed with patient who verbalizes understanding of care plan and is agreeable to discharge. I have discussed return precautions with patient who verbalizes understanding. Patient encouraged to follow-up with orthopedics. All questions answered.  Patient's case discussed with Dr. Rosalia Hammers who agrees with plan to discharge with follow-up.   Note: Portions of this report may have been transcribed using voice recognition software. Every effort was made to ensure accuracy; however, inadvertent computerized transcription errors may still be present.        Final Clinical Impression(s) / ED Diagnoses Final diagnoses:  Olecranon bursitis of left elbow    Rx / DC Orders ED Discharge Orders     None          Michelle Piper, PA-C 03/14/23 1145    Margarita Grizzle, MD 03/15/23 [phone]

## 2023-03-14 NOTE — ED Triage Notes (Signed)
Here for a multiple of problems.  Right hip pain and left elbow pain.  Pain and weakness to legs.  States has had several back surgery.  States the main reason came today is pain and swelling to left elbow.  Onset two weeks.  To room via WC difficult to transfer to bed.

## 2023-03-15 ENCOUNTER — Other Ambulatory Visit (HOSPITAL_COMMUNITY): Payer: Self-pay

## 2023-03-15 ENCOUNTER — Telehealth: Payer: Self-pay | Admitting: Pharmacist

## 2023-03-15 MED ORDER — MOUNJARO 5 MG/0.5ML ~~LOC~~ SOAJ
5.0000 mg | SUBCUTANEOUS | 11 refills | Status: DC
  Filled 2023-03-15: qty 2, 28d supply, fill #0

## 2023-03-15 NOTE — Telephone Encounter (Signed)
Called pt to follow up with Mounjaro injections. Has been losing about 1/2 lb per week. Does have some mild nausea in the morning depending on what he eats. Has been trying to cut back on processed foods as he feels his nausea is worse when he eats processed food. He prefers to stay on same dose of 5mg  since he's experiencing a bit of nausea. Rx sent in with refills. Advised pt to let me know if he wishes to increase his dose in the future.

## 2023-03-19 ENCOUNTER — Telehealth: Payer: Self-pay

## 2023-03-19 ENCOUNTER — Encounter (HOSPITAL_COMMUNITY)
Admission: RE | Admit: 2023-03-19 | Discharge: 2023-03-19 | Disposition: A | Discharge status: Home / Self Care | Payer: Medicare Other | Source: Ambulatory Visit | Attending: Cardiology | Admitting: Cardiology

## 2023-03-19 VITALS — Wt 266.3 lb

## 2023-03-19 DIAGNOSIS — I272 Pulmonary hypertension, unspecified: Secondary | ICD-10-CM

## 2023-03-19 NOTE — Telephone Encounter (Signed)
Transition Care Management Unsuccessful Follow-up Telephone Call  Date of discharge and from where:  [facility] 6/6  Attempts:  1st Attempt  Reason for unsuccessful TCM follow-up call:  Left voice message   [facility], [facility] [phone] [address] [place], [place], [place] [address] Phone: [phone] Email: [email or web address]

## 2023-03-19 NOTE — Progress Notes (Signed)
HPI: [name] is a 72 y.o. male, who is here today for a follow-up visit and discussion about continuing Keppra.   Last seen on 12/14/22 for hospital follow up. Since his last visit he has been attending cardiac rehab and was recently treated for elbow bursitis (ED 03/14/23, following with ortho). He reported that Dr. Fidela Juneau pain management provider, had prescribed Keppra years ago, possibly for pain management, but was unsure of the exact reason.  He established with new provider for pain management and was recommended to get Keppra from PCP. He is on Levetiracetam 250 mg bid. He is not sure if medication is helping with pain.  Peripheral neuropathy and chronic pain: He is on Hydrocodone-Acetaminophen 7.5-325 mg qid prn and Gabapentin 600 mg tid. He is also on Cymbalta 60 mg daily.  -He is also taking Mounjaro, which was prescribed by Dr. [name] for cardiovascular protection and DM II. He reported adherence to a healthy diet and has been more mobile at home, using a walker.  Lab Results  Component Value Date   HGBA1C 5.5 12/24/2022   He had a virtual eye exam three months ago.  -HTN on Amlodipine 10 mg daily,,Spironolactone 25 mg daily, Carvedilol 12.5 mg bid, He has been experiencing LE edema, left seems to be worse, problem has been going on for several months. Hx of LLE DVT. He has not noted erythema or pain. Lab Results  Component Value Date   CREATININE 1.36 (H) 12/24/2022   BUN 21 12/24/2022   NA 139 12/24/2022   K 4.1 12/24/2022   CL 110 12/24/2022   CO2 22 12/24/2022   Negative for CP,orthopnea,PND,or palpitations.  Lab Results  Component Value Date   MICROALBUR 11.3 (H) 01/17/2022   MICROALBUR 8.1 (H) 03/30/2020   Review of Systems  Constitutional:  Negative for chills and fever.  HENT:  Negative for mouth sores and sore throat.   Respiratory:  Negative for cough and wheezing.   Gastrointestinal:  Negative for abdominal pain, nausea and  vomiting.  Endocrine: Negative for polydipsia, polyphagia and polyuria.  Genitourinary:  Negative for decreased urine volume, dysuria and hematuria.  Skin:  Negative for rash.  Neurological:  Negative for syncope and facial asymmetry.  See other pertinent positives and negatives in HPI.  Current Outpatient Medications on File Prior to Visit  Medication Sig Dispense Refill   acetaminophen (TYLENOL) 325 MG tablet Take 1-2 tablets (325-650 mg total) by mouth every 4 (four) hours as needed for mild pain.     albuterol (VENTOLIN HFA) 108 (90 Base) MCG/ACT inhaler Inhale 2 puffs into the lungs every 6 (six) hours as needed for wheezing or shortness of breath. 8 g 1   amLODipine (NORVASC) 10 MG tablet TAKE 1 TABLET BY MOUTH DAILY 100 tablet 2   apixaban (ELIQUIS) 5 MG TABS tablet Take 1 tablet (5 mg total) by mouth 2 (two) times daily. 60 tablet 11   Blood Glucose Monitoring Suppl (ACCU-CHEK GUIDE ME) w/Device KIT USE TO TEST BLOOD SUGARS 1-2 TIMES DAILY. 1 kit 0   carvedilol (COREG) 12.5 MG tablet Take 1 tablet (12.5 mg total) by mouth 2 (two) times daily with a meal. 180 tablet 3   cyanocobalamin (VITAMIN B12) 1000 MCG tablet Take 1,000 mcg by mouth daily.     DULoxetine (CYMBALTA) 60 MG capsule TAKE 1 CAPSULE BY MOUTH EVERY DAY 90 capsule 0   ferrous sulfate 325 (65 FE) MG tablet TAKE 1 TABLET BY MOUTH EVERY DAY WITH BREAKFAST 90  tablet 1   gabapentin (NEURONTIN) 400 MG capsule Take 400 mg by mouth 3 (three) times daily.     gabapentin (NEURONTIN) 600 MG tablet Take 600 mg by mouth 3 (three) times daily.     HYDROcodone-acetaminophen (NORCO) 7.5-325 MG tablet Take 1 tablet by mouth in the morning, at noon, in the evening, and at bedtime. 7.5 mg/375 mg     hydrocortisone (ANUSOL-HC) 2.5 % rectal cream Place 1 application. rectally as needed for hemorrhoids or anal itching.     lidocaine (LIDODERM) 5 % Place 1 patch onto the skin daily as needed (pain). Remove & Discard patch within 12 hours or as  directed by MD     naloxone (NARCAN) nasal spray 4 mg/0.1 mL Place 1 spray into the nose as needed (accidental overdose).     omeprazole (PRILOSEC) 40 MG capsule TAKE 1 CAPSULE BY MOUTH DAILY 100 capsule 2   polyethylene glycol (MIRALAX / GLYCOLAX) 17 g packet Take 17 g by mouth daily as needed for mild constipation.     rOPINIRole (REQUIP) 1 MG tablet Take 1 tablet (1 mg total) by mouth at bedtime. 90 tablet 2   rosuvastatin (CRESTOR) 20 MG tablet Take 1 tablet (20 mg total) by mouth daily. 90 tablet 3   spironolactone (ALDACTONE) 25 MG tablet Take 1 tablet (25 mg total) by mouth daily. 90 tablet 3   tirzepatide (MOUNJARO) 5 MG/0.5ML Pen Inject 5 mg into the skin once a week. 2 mL 11   torsemide (DEMADEX) 20 MG tablet TAKE 1 AND 1/2 TABLETS DAILY BY MOUTH 135 tablet 2   traZODone (DESYREL) 50 MG tablet Take 0.5-1 tablets (25-50 mg total) by mouth at bedtime as needed. for sleep 90 tablet 0   No current facility-administered medications on file prior to visit.   Past Medical History:  Diagnosis Date   Allergy    CHF (congestive heart failure) (HCC)    Chronic back pain    Diabetes mellitus without complication (HCC)    DNR (do not resuscitate) 11/19/2020   Duodenal ulcer hemorrhage 08/29/2014   ED (erectile dysfunction)    Esophageal stricture 08/30/2014   Glucose intolerance (impaired glucose tolerance)    Hiatal hernia 08/30/2014   Hypertension    Hypokalemia 04/11/2013   MRSA carrier 08/30/2014   Obesity    Osteoarthritis    Pneumonia    Pulmonary embolism (HCC) 06/2020   Scoliosis 2016   Spinal stenosis of lumbar region    Urinary tract infection 04/11/2013   Allergies  Allergen Reactions   Aspirin Other (See Comments)    Irritates the stomach and the patient developed ulcers, also   Lisinopril Other (See Comments)    Caused a body ache    Social History   Socioeconomic History   Marital status: Married    Spouse name: Wynona Canes   Number of children: 5   Years of  education: 4 years college   Highest education level: Bachelor's degree (e.g., BA, AB, BS)  Occupational History   Occupation: retired  Tobacco Use   Smoking status: Former    Packs/day: 1.00    Years: 25.00    Additional pack years: 0.00    Total pack years: 25.00    Types: Cigarettes    Quit date: 1995    Years since quitting: 29.4    Passive exposure: Never   Smokeless tobacco: Never  Vaping Use   Vaping Use: Never used  Substance and Sexual Activity   Alcohol use: Not Currently  Comment: holidays and special occasions   Drug use: Not Currently   Sexual activity: Yes    Birth control/protection: None  Other Topics Concern   Not on file  Social History Narrative   Married, 5 kids, 14 grand, 2 great grand, watch TV, Armed forces operational officer, traveling   Social Determinants of Health   Financial Resource Strain: Low Risk  (03/16/2023)   Overall Financial Resource Strain (CARDIA)    Difficulty of Paying Living Expenses: Not hard at all  Food Insecurity: No Food Insecurity (03/16/2023)   Hunger Vital Sign    Worried About Running Out of Food in the Last Year: Never true    Ran Out of Food in the Last Year: Never true  Transportation Needs: No Transportation Needs (03/16/2023)   PRAPARE - Administrator, Civil Service (Medical): No    Lack of Transportation (Non-Medical): No  Physical Activity: Insufficiently Active (03/16/2023)   Exercise Vital Sign    Days of Exercise per Week: 3 days    Minutes of Exercise per Session: 30 min  Stress: No Stress Concern Present (03/16/2023)   [employer] of Occupational Health - Occupational Stress Questionnaire    Feeling of Stress : Not at all  Social Connections: Moderately Integrated (03/16/2023)   Social Connection and Isolation Panel [NHANES]    Frequency of Communication with Friends and Family: More than three times a week    Frequency of Social Gatherings with Friends and Family: Twice a week    Attends Religious  Services: More than 4 times per year    Active Member of [employer] or Organizations: No    Attends Banker Meetings: Not on file    Marital Status: Married   Vitals:   03/20/23 1350  BP: 118/70  Pulse: 100  Resp: 16  SpO2: 97%   Body mass index is 36.12 kg/m.  Physical Exam Vitals and nursing note reviewed.  Constitutional:      General: He is not in acute distress.    Appearance: He is well-developed.  HENT:     Head: Normocephalic and atraumatic.  Eyes:     Conjunctiva/sclera: Conjunctivae normal.  Cardiovascular:     Rate and Rhythm: Normal rate and regular rhythm.     Heart sounds: No murmur heard.    Comments: DP pulses palpable. LE's pitting edema:1+ RLE 2 + LLE and pedal edema. Pulmonary:     Effort: Pulmonary effort is normal. No respiratory distress.     Breath sounds: Normal breath sounds.  Abdominal:     Palpations: Abdomen is soft. There is no mass.     Tenderness: There is no abdominal tenderness.  Skin:    General: Skin is warm.     Findings: No erythema or rash.  Neurological:     Mental Status: He is alert and oriented to person, place, and time.     Cranial Nerves: No cranial nerve deficit.     Comments: He is in a wheel chair.  Psychiatric:        Mood and Affect: Mood and affect normal.   ASSESSMENT AND PLAN:  Mr. Zackeriah was seen today for medication management.  Diagnoses and all orders for this visit: Lab Results  Component Value Date   MICROALBUR 1.9 03/21/2023   MICROALBUR 11.3 (H) 01/17/2022   Bilateral lower extremity edema Assessment & Plan: This is a chronic problem. We discussed possible etiologies. Left is worse than right, history of LLE DVT, which is most likely  contributing to problem. Currently he is on torsemide 20 mg 1/2 tablet daily. Recommend compression stockings and continue appropriate skin care.   Essential hypertension Assessment & Plan: BP adequately controlled. Continue carvedilol 12.5 mg twice  daily,Spironolactone 25 mg daily, and Amlodipine 10 mg daily. Continue low-salt diet. Monitor BP regularly. Eye exam is current.   Type 2 diabetes mellitus with diabetic neuropathy, without long-term current use of insulin (HCC) Assessment & Plan: Problem has been adequately controlled on nonpharmacologic treatment, last hemoglobin A1c in 12/2022 was 5.5.  Since his last visit, his cardiology started [facility] for CV protection. Annual eye exam and foot care to continue. F/U in 6 months.  Orders: -     Microalbumin / creatinine urine ratio; Future -     Microalbumin / creatinine urine ratio; Future  Chronic pain syndrome Assessment & Plan: He has established with new provider. Currently on hydrocodone-acetaminophen 7.5-325 mg and gabapentin 600 mg 3 times daily. He is not certain if Keppra is helping with pain management, which I assumed is the risks on medication was added by former pain management. Recommend starting weaning off Keppra, instructions given on AVS.  Orders: -     levETIRAcetam; Take 1 tablet (250 mg total) by mouth 2 (two) times daily.  Dispense: 60 tablet; Refill: 1  He is scheduled for a colonoscopy on the 04/03/23.  Return in about 6 months (around 09/19/2023) for chronic problems.  Quiara Killian G. [place], MD  [facility] [place] office.

## 2023-03-19 NOTE — Progress Notes (Signed)
Daily Session Note  Patient Details  Name: Keith [name] MRN: [id number] Date of Birth: 12-17-1950 Referring Provider:   Doristine Devoid Pulmonary Rehab Walk Test from 01/14/2023 in [facility]  Referring Provider [name]       Encounter Date: 03/19/2023  Check In:  Session Check In - 03/19/23 1043       Check-In   Supervising physician immediately available to respond to emergencies [facility] MD immediately available    Physician(s) Jari Favre, PA    Location [facility]    Staff Present Elissa Lovett BS, ACSM-CEP, Exercise Physiologist;Yobana Culliton Earlene Plater, MS, ACSM-CEP, Exercise Physiologist;Casey Robina Ade, RN, MSN;Mary Bastin, RN, BSN    Virtual Visit No    Medication changes reported     No    Fall or balance concerns reported    No    Tobacco Cessation No Change    Warm-up and Cool-down Performed as group-led instruction    Resistance Training Performed Yes    VAD Patient? No    PAD/SET Patient? No      Pain Assessment   Currently in Pain? Yes    Pain Score 5     Pain Location Hip    Pain Orientation Right    Pain Descriptors / Indicators Aching    Pain Type Chronic pain    Pain Onset More than a month ago    Pain Frequency Intermittent    Multiple Pain Sites No             Capillary Blood Glucose: No results found for this or any previous visit (from the past 24 hour(s)).   Exercise Prescription Changes - 03/19/23 1100       Response to Exercise   Blood Pressure (Admit) 100/58    Blood Pressure (Exercise) 100/56    Blood Pressure (Exit) 122/68    Heart Rate (Admit) 94 bpm    Heart Rate (Exercise) 112 bpm    Heart Rate (Exit) 9 bpm    Oxygen Saturation (Admit) 98 %    Oxygen Saturation (Exercise) 96 %    Oxygen Saturation (Exit) 97 %    Rating of Perceived Exertion (Exercise) 13    Perceived Dyspnea (Exercise) 1    Duration Continue with 30 min of aerobic exercise without  signs/symptoms of physical distress.    Intensity THRR unchanged      Progression   Progression Continue to progress workloads to maintain intensity without signs/symptoms of physical distress.      Resistance Training   Training Prescription Yes    Weight blue bands    Reps 10-15    Time 10 Minutes      T5 Nustep   Level 1    Minutes 15    METs 1.9             Social History   Tobacco Use  Smoking Status Former   Packs/day: 1.00   Years: 25.00   Additional pack years: 0.00   Total pack years: 25.00   Types: Cigarettes   Quit date: 28   Years since quitting: 29.4   Passive exposure: Never  Smokeless Tobacco Never    Goals Met:  Proper associated with RPD/PD & O2 Sat Exercise tolerated well No report of concerns or symptoms today Strength training completed today  Goals Unmet:  Not Applicable  Comments: Service time is from 1004 to 1127.    Dr. Mechele Collin is Medical Director  for Pulmonary Rehab at [facility].

## 2023-03-20 ENCOUNTER — Encounter: Payer: Self-pay | Admitting: Family Medicine

## 2023-03-20 ENCOUNTER — Ambulatory Visit (INDEPENDENT_AMBULATORY_CARE_PROVIDER_SITE_OTHER): Payer: Medicare Other | Admitting: Family Medicine

## 2023-03-20 ENCOUNTER — Ambulatory Visit: Payer: Medicare Other | Admitting: Family Medicine

## 2023-03-20 ENCOUNTER — Telehealth: Payer: Self-pay

## 2023-03-20 VITALS — BP 118/70 | HR 100 | Resp 16 | Ht 72.0 in

## 2023-03-20 DIAGNOSIS — R600 Localized edema: Secondary | ICD-10-CM | POA: Insufficient documentation

## 2023-03-20 DIAGNOSIS — E1140 Type 2 diabetes mellitus with diabetic neuropathy, unspecified: Secondary | ICD-10-CM | POA: Diagnosis not present

## 2023-03-20 DIAGNOSIS — G894 Chronic pain syndrome: Secondary | ICD-10-CM | POA: Diagnosis not present

## 2023-03-20 DIAGNOSIS — I10 Essential (primary) hypertension: Secondary | ICD-10-CM

## 2023-03-20 DIAGNOSIS — Z7985 Long-term (current) use of injectable non-insulin antidiabetic drugs: Secondary | ICD-10-CM

## 2023-03-20 MED ORDER — LEVETIRACETAM 250 MG PO TABS: 250.0000 mg | ORAL_TABLET | Freq: Two times a day (BID) | ORAL | 1 refills | Status: DC

## 2023-03-20 MED ORDER — LEVETIRACETAM 250 MG PO TABS
250.0000 mg | ORAL_TABLET | Freq: Two times a day (BID) | ORAL | 1 refills | Status: DC
Start: 2023-03-20 — End: 2023-06-12

## 2023-03-20 NOTE — Patient Instructions (Addendum)
A few things to remember from today's visit:  Bilateral lower extremity edema  Essential hypertension  Type 2 diabetes mellitus with diabetic neuropathy, without long-term current use of insulin (HCC) - Plan: Microalbumin / creatinine urine ratio  Chronic pain syndrome  Keppra to be weaned off: alternate between 2 tabs daily and 1 tab daily for 2 weeks, then 1 tab daily for 2 weeks then 1 tab every other day for 2 weeks, then every 3rd day for 2 weeks and discontinue.  If you need refills for medications you take chronically, please call your pharmacy. Do not use My Chart to request refills or for acute issues that need immediate attention. If you send a my chart message, it may take a few days to be addressed, specially if I am not in the office.  Please be sure medication list is accurate. If a new problem present, please set up appointment sooner than planned today.

## 2023-03-20 NOTE — Telephone Encounter (Signed)
Transition Care Management Unsuccessful Follow-up Telephone Call  Date of discharge and from where:  [facility] 6/6  Attempts:  2nd Attempt  Reason for unsuccessful TCM follow-up call:  Unable to leave message   Lenard Forth [facility] Guide, [facility] [phone] [address] [place], [place], [place] [address] Phone: [phone] Email: [email or web address]

## 2023-03-20 NOTE — Assessment & Plan Note (Signed)
Problem has been adequately controlled on nonpharmacologic treatment, last hemoglobin A1c in 12/2022 was 5.5.  Since his last visit, his cardiology started [facility] for CV protection. Annual eye exam and foot care to continue. F/U in 6 months.

## 2023-03-20 NOTE — Assessment & Plan Note (Signed)
He has established with new provider. Currently on hydrocodone-acetaminophen 7.5-325 mg and gabapentin 600 mg 3 times daily. He is not certain if Keppra is helping with pain management, which I assume is the risks on medication was added by former pain management. Recommend starting weaning off Keppra, instructions given on AVS.

## 2023-03-20 NOTE — Progress Notes (Signed)
Pulmonary Individual Treatment Plan  Patient Details  Name: Keith [name] MRN: [id number] Date of Birth: 1950/11/26 Referring Provider:   Doristine Devoid Pulmonary Rehab Walk Test from 01/14/2023 in [facility]  Referring Provider [facility]       Initial Encounter Date:  Flowsheet Row Pulmonary Rehab Walk Test from 01/14/2023 in [facility]  Date 01/14/23       Visit Diagnosis: Pulmonary HTN (HCC)  Patient's Home Medications on Admission:   Current Outpatient Medications:    acetaminophen (TYLENOL) 325 MG tablet, Take 1-2 tablets (325-650 mg total) by mouth every 4 (four) hours as needed for mild pain., Disp: , Rfl:    albuterol (VENTOLIN HFA) 108 (90 Base) MCG/ACT inhaler, Inhale 2 puffs into the lungs every 6 (six) hours as needed for wheezing or shortness of breath., Disp: 8 g, Rfl: 1   amLODipine (NORVASC) 10 MG tablet, TAKE 1 TABLET BY MOUTH DAILY, Disp: 100 tablet, Rfl: 2   apixaban (ELIQUIS) 5 MG TABS tablet, Take 1 tablet (5 mg total) by mouth 2 (two) times daily., Disp: 60 tablet, Rfl: 11   Blood Glucose Monitoring Suppl (ACCU-CHEK GUIDE ME) w/Device KIT, USE TO TEST BLOOD SUGARS 1-2 TIMES DAILY., Disp: 1 kit, Rfl: 0   carvedilol (COREG) 12.5 MG tablet, Take 1 tablet (12.5 mg total) by mouth 2 (two) times daily with a meal., Disp: 180 tablet, Rfl: 3   cyanocobalamin (VITAMIN B12) 1000 MCG tablet, Take 1,000 mcg by mouth daily., Disp: , Rfl:    DULoxetine (CYMBALTA) 60 MG capsule, TAKE 1 CAPSULE BY MOUTH EVERY DAY, Disp: 90 capsule, Rfl: 0   ferrous sulfate 325 (65 FE) MG tablet, TAKE 1 TABLET BY MOUTH EVERY DAY WITH BREAKFAST, Disp: 90 tablet, Rfl: 1   gabapentin (NEURONTIN) 400 MG capsule, Take 400 mg by mouth 3 (three) times daily., Disp: , Rfl:    gabapentin (NEURONTIN) 600 MG tablet, Take 600 mg by mouth 3 (three) times daily., Disp: , Rfl:     HYDROcodone-acetaminophen (NORCO) 7.5-325 MG tablet, Take 1 tablet by mouth in the morning, at noon, in the evening, and at bedtime. 7.5 mg/375 mg, Disp: , Rfl:    hydrocortisone (ANUSOL-HC) 2.5 % rectal cream, Place 1 application. rectally as needed for hemorrhoids or anal itching., Disp: , Rfl:    levETIRAcetam (KEPPRA) 250 MG tablet, Take 1 tablet (250 mg total) by mouth 2 (two) times daily., Disp: 180 tablet, Rfl: 0   lidocaine (LIDODERM) 5 %, Place 1 patch onto the skin daily as needed (pain). Remove & Discard patch within 12 hours or as directed by MD, Disp: , Rfl:    naloxone (NARCAN) nasal spray 4 mg/0.1 mL, Place 1 spray into the nose as needed (accidental overdose)., Disp: , Rfl:    omeprazole (PRILOSEC) 40 MG capsule, TAKE 1 CAPSULE BY MOUTH DAILY, Disp: 100 capsule, Rfl: 2   polyethylene glycol (MIRALAX / GLYCOLAX) 17 g packet, Take 17 g by mouth daily as needed for mild constipation., Disp: , Rfl:    rOPINIRole (REQUIP) 1 MG tablet, Take 1 tablet (1 mg total) by mouth at bedtime., Disp: 90 tablet, Rfl: 2   rosuvastatin (CRESTOR) 20 MG tablet, Take 1 tablet (20 mg total) by mouth daily., Disp: 90 tablet, Rfl: 3   spironolactone (ALDACTONE) 25 MG tablet, Take 1 tablet (25 mg total) by mouth daily., Disp: 90 tablet, Rfl: 3   tirzepatide (  MOUNJARO) 5 MG/0.5ML Pen, Inject 5 mg into the skin once a week., Disp: 2 mL, Rfl: 11   torsemide (DEMADEX) 20 MG tablet, TAKE 1 AND 1/2 TABLETS DAILY BY MOUTH, Disp: 135 tablet, Rfl: 2   traZODone (DESYREL) 50 MG tablet, Take 0.5-1 tablets (25-50 mg total) by mouth at bedtime as needed. for sleep, Disp: 90 tablet, Rfl: 0  Past Medical History: Past Medical History:  Diagnosis Date   Allergy    CHF (congestive heart failure) (HCC)    Chronic back pain    Diabetes mellitus without complication (HCC)    DNR (do not resuscitate) 11/19/2020   Duodenal ulcer hemorrhage 08/29/2014   ED (erectile dysfunction)    Esophageal stricture 08/30/2014   Glucose  intolerance (impaired glucose tolerance)    Hiatal hernia 08/30/2014   Hypertension    Hypokalemia 04/11/2013   MRSA carrier 08/30/2014   Obesity    Osteoarthritis    Pneumonia    Pulmonary embolism (HCC) 06/2020   Scoliosis 2016   Spinal stenosis of lumbar region    Urinary tract infection 04/11/2013    Tobacco Use: Social History   Tobacco Use  Smoking Status Former   Packs/day: 1.00   Years: 25.00   Additional pack years: 0.00   Total pack years: 25.00   Types: Cigarettes   Quit date: 92   Years since quitting: 29.4   Passive exposure: Never  Smokeless Tobacco Never    Labs: Review Flowsheet  More data exists      Latest Ref Rng & Units 03/02/2022 07/06/2022 07/18/2022 11/02/2022 12/24/2022  Labs for ITP Cardiac and Pulmonary Rehab  Hemoglobin A1c 4.8 - 5.6 % - - 6.2  6.1  5.5   PH, Arterial 7.35 - 7.45 7.35 - 7.45 7.429  7.438  - - - -  PCO2 arterial 32 - 48 mmHg 32 - 48 mmHg 46.7  46.1  - - - -  Bicarbonate 20.0 - 28.0 mmol/L 20.0 - 28.0 mmol/L 31.0  31.2  35.2  28.5  27.3  - - -  TCO2 22 - 32 mmol/L 22 - 32 mmol/L 32  33  37  30  29  - - -  O2 Saturation % % 96  97  57  60  59  - - -    Capillary Blood Glucose: Lab Results  Component Value Date   GLUCAP 127 (H) 07/06/2022   GLUCAP 106 (H) 09/06/2021   GLUCAP 96 09/05/2021   GLUCAP 78 09/05/2021   GLUCAP 98 09/04/2021     Pulmonary Assessment Scores:  Pulmonary Assessment Scores     Row Name 01/14/23 1258         ADL UCSD   SOB Score total 76       CAT Score   CAT Score 25       mMRC Score   mMRC Score 4             UCSD: Self-administered rating of dyspnea associated with activities of daily living (ADLs) 6-point scale (0 = "not at all" to 5 = "maximal or unable to do because of breathlessness")  Scoring Scores range from 0 to 120.  Minimally important difference is 5 units  CAT: CAT can identify the health impairment of COPD patients and is better correlated with disease  progression.  CAT has a scoring range of zero to 40. The CAT score is classified into four groups of low (less than 10), medium (10 - 20), high (21-30) and  very high (31-40) based on the impact level of disease on health status. A CAT score over 10 suggests significant symptoms.  A worsening CAT score could be explained by an exacerbation, poor medication adherence, poor inhaler technique, or progression of COPD or comorbid conditions.  CAT MCID is 2 points  mMRC: mMRC (Modified Medical Research Council) Dyspnea Scale is used to assess the degree of baseline functional disability in patients of respiratory disease due to dyspnea. No minimal important difference is established. A decrease in score of 1 point or greater is considered a positive change.   Pulmonary Function Assessment:  Pulmonary Function Assessment - 01/14/23 1301       Breath   Bilateral Breath Sounds Decreased    Shortness of Breath Limiting activity;Yes             Exercise Target Goals: Exercise Program Goal: Individual exercise prescription set using results from initial 6 min walk test and THRR while considering  patient's activity barriers and safety.   Exercise Prescription Goal: Initial exercise prescription builds to 30-45 minutes a day of aerobic activity, 2-3 days per week.  Home exercise guidelines will be given to patient during program as part of exercise prescription that the participant will acknowledge.  Activity Barriers & Risk Stratification:  Activity Barriers & Cardiac Risk Stratification - 01/14/23 1104       Activity Barriers & Cardiac Risk Stratification   Activity Barriers Deconditioning;Muscular Weakness;Shortness of Breath;Back Problems;Balance Concerns             6 Minute Walk:  6 Minute Walk     Row Name 01/14/23 1207         6 Minute Walk   Phase Initial  Performed on NuStep     Distance 1049.9 feet     Walk Time 6 minutes     # of Rest Breaks 0     MPH 1.99      METS 2.1     RPE 9     Perceived Dyspnea  1     VO2 Peak 7.36     Resting HR 94 bpm     Resting BP 122/62     Resting Oxygen Saturation  96 %     Exercise Oxygen Saturation  during 6 min walk 96 %     Max Ex. HR 115 bpm     Max Ex. BP 134/62     2 Minute Post BP 120/60       Interval HR   1 Minute HR 105     2 Minute HR 107     3 Minute HR 109     4 Minute HR 111     5 Minute HR 106     6 Minute HR 115     2 Minute Post HR 98     Interval Heart Rate? Yes       Interval Oxygen   Interval Oxygen? Yes     Baseline Oxygen Saturation % 96 %     1 Minute Oxygen Saturation % 97 %     1 Minute Liters of Oxygen 0 L     2 Minute Oxygen Saturation % 98 %     2 Minute Liters of Oxygen 0 L     3 Minute Oxygen Saturation % 98 %     3 Minute Liters of Oxygen 0 L     4 Minute Oxygen Saturation % 98 %     4 Minute Liters of Oxygen  0 L     5 Minute Oxygen Saturation % 99 %     5 Minute Liters of Oxygen 0 L     6 Minute Oxygen Saturation % 97 %     6 Minute Liters of Oxygen 0 L     2 Minute Post Oxygen Saturation % 97 %     2 Minute Post Liters of Oxygen 0 L              Oxygen Initial Assessment:  Oxygen Initial Assessment - 01/14/23 1223       Home Oxygen   Home Oxygen Device None    Sleep Oxygen Prescription None    Home Exercise Oxygen Prescription None    Home Resting Oxygen Prescription None      Initial 6 min Walk   Oxygen Used None      Program Oxygen Prescription   Program Oxygen Prescription None      Intervention   Short Term Goals To learn and understand importance of maintaining oxygen saturations>88%;To learn and demonstrate proper use of respiratory medications;To learn and understand importance of monitoring SPO2 with pulse oximeter and demonstrate accurate use of the pulse oximeter.;To learn and demonstrate proper pursed lip breathing techniques or other breathing techniques.     Long  Term Goals Verbalizes importance of monitoring SPO2 with pulse  oximeter and return demonstration;Maintenance of O2 saturations>88%;Exhibits proper breathing techniques, such as pursed lip breathing or other method taught during program session;Compliance with respiratory medication;Demonstrates proper use of MDI's             Oxygen Re-Evaluation:  Oxygen Re-Evaluation     Row Name 01/15/23 0841 02/14/23 1604 03/11/23 1159         Program Oxygen Prescription   Program Oxygen Prescription None None None       Home Oxygen   Home Oxygen Device None None None     Sleep Oxygen Prescription None None None     Home Exercise Oxygen Prescription None None None     Home Resting Oxygen Prescription None None None       Goals/Expected Outcomes   Short Term Goals To learn and understand importance of maintaining oxygen saturations>88%;To learn and demonstrate proper use of respiratory medications;To learn and understand importance of monitoring SPO2 with pulse oximeter and demonstrate accurate use of the pulse oximeter.;To learn and demonstrate proper pursed lip breathing techniques or other breathing techniques.  To learn and understand importance of maintaining oxygen saturations>88%;To learn and demonstrate proper use of respiratory medications;To learn and understand importance of monitoring SPO2 with pulse oximeter and demonstrate accurate use of the pulse oximeter.;To learn and demonstrate proper pursed lip breathing techniques or other breathing techniques.  To learn and understand importance of maintaining oxygen saturations>88%;To learn and demonstrate proper use of respiratory medications;To learn and understand importance of monitoring SPO2 with pulse oximeter and demonstrate accurate use of the pulse oximeter.;To learn and demonstrate proper pursed lip breathing techniques or other breathing techniques.      Long  Term Goals Verbalizes importance of monitoring SPO2 with pulse oximeter and return demonstration;Maintenance of O2 saturations>88%;Exhibits  proper breathing techniques, such as pursed lip breathing or other method taught during program session;Compliance with respiratory medication;Demonstrates proper use of MDI's Verbalizes importance of monitoring SPO2 with pulse oximeter and return demonstration;Maintenance of O2 saturations>88%;Exhibits proper breathing techniques, such as pursed lip breathing or other method taught during program session;Compliance with respiratory medication;Demonstrates proper use of MDI's Verbalizes importance of monitoring SPO2  with pulse oximeter and return demonstration;Maintenance of O2 saturations>88%;Exhibits proper breathing techniques, such as pursed lip breathing or other method taught during program session;Compliance with respiratory medication;Demonstrates proper use of MDI's     Goals/Expected Outcomes Compliance and understanding of oxygen saturation monitoring and breathing techniques to decrease shortness of breath. Compliance and understanding of oxygen saturation monitoring and breathing techniques to decrease shortness of breath. Compliance and understanding of oxygen saturation monitoring and breathing techniques to decrease shortness of breath.              Oxygen Discharge (Final Oxygen Re-Evaluation):  Oxygen Re-Evaluation - 03/11/23 1159       Program Oxygen Prescription   Program Oxygen Prescription None      Home Oxygen   Home Oxygen Device None    Sleep Oxygen Prescription None    Home Exercise Oxygen Prescription None    Home Resting Oxygen Prescription None      Goals/Expected Outcomes   Short Term Goals To learn and understand importance of maintaining oxygen saturations>88%;To learn and demonstrate proper use of respiratory medications;To learn and understand importance of monitoring SPO2 with pulse oximeter and demonstrate accurate use of the pulse oximeter.;To learn and demonstrate proper pursed lip breathing techniques or other breathing techniques.     Long  Term Goals  Verbalizes importance of monitoring SPO2 with pulse oximeter and return demonstration;Maintenance of O2 saturations>88%;Exhibits proper breathing techniques, such as pursed lip breathing or other method taught during program session;Compliance with respiratory medication;Demonstrates proper use of MDI's    Goals/Expected Outcomes Compliance and understanding of oxygen saturation monitoring and breathing techniques to decrease shortness of breath.             Initial Exercise Prescription:  Initial Exercise Prescription - 01/14/23 1200       Date of Initial Exercise RX and Referring Provider   Date 01/14/23    Referring Provider [name]    Expected Discharge Date 04/09/23      T5 Nustep   Level 1    SPM 50    Minutes 30    METs 2.1      Prescription Details   Frequency (times per week) 2    Duration Progress to 30 minutes of continuous aerobic without signs/symptoms of physical distress      Intensity   THRR 40-80% of Max Heartrate 60-120    Ratings of Perceived Exertion 11-13    Perceived Dyspnea 0-4      Progression   Progression Continue progressive overload as per policy without signs/symptoms or physical distress.      Resistance Training   Training Prescription Yes    Weight red bands    Reps 10-15             Perform Capillary Blood Glucose checks as needed.  Exercise Prescription Changes:   Exercise Prescription Changes     Row Name 01/22/23 1600 02/05/23 1200 02/19/23 1200 03/05/23 1200 03/12/23 1500     Response to Exercise   Blood Pressure (Admit) 100/60 108/56 120/70 128/64 --   Blood Pressure (Exercise) 126/70 142/72 118/70 126/64 --   Blood Pressure (Exit) 112/64 100/60 102/60 100/58 --   Heart Rate (Admit) 95 bpm 96 bpm 100 bpm 95 bpm --   Heart Rate (Exercise) 112 bpm 111 bpm 111 bpm 108 bpm --   Heart Rate (Exit) 99 bpm 104 bpm 97 bpm 96 bpm --   Oxygen Saturation (Admit) 98 % 98 % 98 % 97 % --  Oxygen Saturation (Exercise) 97 % 94 % 93 %  93 % --   Oxygen Saturation (Exit) 98 % 98 % 97 % 96 % --   Rating of Perceived Exertion (Exercise) 13 10 11 11  --   Perceived Dyspnea (Exercise) 1 1 1  0 --   Duration Progress to 30 minutes of  aerobic without signs/symptoms of physical distress Progress to 30 minutes of  aerobic without signs/symptoms of physical distress Continue with 30 min of aerobic exercise without signs/symptoms of physical distress. Continue with 30 min of aerobic exercise without signs/symptoms of physical distress. --   Intensity THRR unchanged THRR unchanged THRR unchanged THRR unchanged --     Progression   Progression Continue to progress workloads to maintain intensity without signs/symptoms of physical distress. Continue to progress workloads to maintain intensity without signs/symptoms of physical distress. Continue to progress workloads to maintain intensity without signs/symptoms of physical distress. Continue to progress workloads to maintain intensity without signs/symptoms of physical distress. --     Paramedic Prescription Yes Yes Yes Yes --   Weight red bands red bands red bands blue bands --   Reps 10-15 10-15 10-15 10-15 --   Time 10 Minutes 10 Minutes 10 Minutes 10 Minutes --     T5 Nustep   Level 1 2 2 3  --   SPM 50 60 -- -- --   Minutes 30 30 15 15  --   METs 1.7 1.9 2 2  --     Home Exercise Plan   Plans to continue exercise at -- -- -- -- Home (comment)   Frequency -- -- -- -- --  N/A   Initial Home Exercises Provided -- -- -- -- 03/12/23    Row Name 03/19/23 1100             Response to Exercise   Blood Pressure (Admit) 100/58       Blood Pressure (Exercise) 100/56       Blood Pressure (Exit) 122/68       Heart Rate (Admit) 94 bpm       Heart Rate (Exercise) 112 bpm       Heart Rate (Exit) 9 bpm       Oxygen Saturation (Admit) 98 %       Oxygen Saturation (Exercise) 96 %       Oxygen Saturation (Exit) 97 %       Rating of Perceived Exertion (Exercise) 13        Perceived Dyspnea (Exercise) 1       Duration Continue with 30 min of aerobic exercise without signs/symptoms of physical distress.       Intensity THRR unchanged         Progression   Progression Continue to progress workloads to maintain intensity without signs/symptoms of physical distress.         Resistance Training   Training Prescription Yes       Weight blue bands       Reps 10-15       Time 10 Minutes         T5 Nustep   Level 1       Minutes 15       METs 1.9                Exercise Comments:   Exercise Comments     Row Name 01/22/23 1603 03/12/23 1517         Exercise Comments Pt completed first  day of group exercise. Pt needs max help x2 with gait belt to stand and transfer to equipment. He exercised on the recumbent stepper for 30 min, level 1, METs 1.7. Performed warm up and cool down in his wheelchair, unable to do leg exercises or stretches therefore did more arm exercises. Pt is motivated. Discussed METs. Will progress as tolerated. Completed home exercise plan. Baby is currently exercising at home. He uses the recumbent stepper 3 days/wk for 60 min/day. I told Yoshiki I was satisfied with his home exercise. He mentioned that his stepper can change in resistance. I encouraged him to increase resistance so that he can build endurance. He agreed with my recommendations. Read is very motivated to exercise at home. I am confident in Esaw carrying out an exercise regimen at home.               Exercise Goals and Review:   Exercise Goals     Row Name 01/14/23 1101 01/15/23 0840 02/14/23 1558 03/11/23 1156       Exercise Goals   Increase Physical Activity Yes Yes Yes Yes    Intervention Provide advice, education, support and counseling about physical activity/exercise needs.;Develop an individualized exercise prescription for aerobic and resistive training based on initial evaluation findings, risk stratification, comorbidities and participant's  personal goals. Provide advice, education, support and counseling about physical activity/exercise needs.;Develop an individualized exercise prescription for aerobic and resistive training based on initial evaluation findings, risk stratification, comorbidities and participant's personal goals. Provide advice, education, support and counseling about physical activity/exercise needs.;Develop an individualized exercise prescription for aerobic and resistive training based on initial evaluation findings, risk stratification, comorbidities and participant's personal goals. Provide advice, education, support and counseling about physical activity/exercise needs.;Develop an individualized exercise prescription for aerobic and resistive training based on initial evaluation findings, risk stratification, comorbidities and participant's personal goals.    Expected Outcomes Short Term: Attend rehab on a regular basis to increase amount of physical activity.;Long Term: Exercising regularly at least 3-5 days a week.;Long Term: Add in home exercise to make exercise part of routine and to increase amount of physical activity. Short Term: Attend rehab on a regular basis to increase amount of physical activity.;Long Term: Exercising regularly at least 3-5 days a week.;Long Term: Add in home exercise to make exercise part of routine and to increase amount of physical activity. Short Term: Attend rehab on a regular basis to increase amount of physical activity.;Long Term: Exercising regularly at least 3-5 days a week.;Long Term: Add in home exercise to make exercise part of routine and to increase amount of physical activity. Short Term: Attend rehab on a regular basis to increase amount of physical activity.;Long Term: Exercising regularly at least 3-5 days a week.;Long Term: Add in home exercise to make exercise part of routine and to increase amount of physical activity.    Increase Strength and Stamina Yes Yes Yes Yes     Intervention Provide advice, education, support and counseling about physical activity/exercise needs.;Develop an individualized exercise prescription for aerobic and resistive training based on initial evaluation findings, risk stratification, comorbidities and participant's personal goals. Provide advice, education, support and counseling about physical activity/exercise needs.;Develop an individualized exercise prescription for aerobic and resistive training based on initial evaluation findings, risk stratification, comorbidities and participant's personal goals. Provide advice, education, support and counseling about physical activity/exercise needs.;Develop an individualized exercise prescription for aerobic and resistive training based on initial evaluation findings, risk stratification, comorbidities and participant's personal goals. Provide advice, education,  support and counseling about physical activity/exercise needs.;Develop an individualized exercise prescription for aerobic and resistive training based on initial evaluation findings, risk stratification, comorbidities and participant's personal goals.    Expected Outcomes Short Term: Increase workloads from initial exercise prescription for resistance, speed, and METs.;Short Term: Perform resistance training exercises routinely during rehab and add in resistance training at home;Long Term: Improve cardiorespiratory fitness, muscular endurance and strength as measured by increased METs and functional capacity ( ) Short Term: Increase workloads from initial exercise prescription for resistance, speed, and METs.;Short Term: Perform resistance training exercises routinely during rehab and add in resistance training at home;Long Term: Improve cardiorespiratory fitness, muscular endurance and strength as measured by increased METs and functional capacity ( ) Short Term: Increase workloads from initial exercise prescription for resistance, speed, and  METs.;Short Term: Perform resistance training exercises routinely during rehab and add in resistance training at home;Long Term: Improve cardiorespiratory fitness, muscular endurance and strength as measured by increased METs and functional capacity ( ) Short Term: Increase workloads from initial exercise prescription for resistance, speed, and METs.;Short Term: Perform resistance training exercises routinely during rehab and add in resistance training at home;Long Term: Improve cardiorespiratory fitness, muscular endurance and strength as measured by increased METs and functional capacity ( )    Able to understand and use rate of perceived exertion (RPE) scale Yes Yes Yes Yes    Intervention Provide education and explanation on how to use RPE scale Provide education and explanation on how to use RPE scale Provide education and explanation on how to use RPE scale Provide education and explanation on how to use RPE scale    Expected Outcomes Short Term: Able to use RPE daily in rehab to express subjective intensity level;Long Term:  Able to use RPE to guide intensity level when exercising independently Short Term: Able to use RPE daily in rehab to express subjective intensity level;Long Term:  Able to use RPE to guide intensity level when exercising independently Short Term: Able to use RPE daily in rehab to express subjective intensity level;Long Term:  Able to use RPE to guide intensity level when exercising independently Short Term: Able to use RPE daily in rehab to express subjective intensity level;Long Term:  Able to use RPE to guide intensity level when exercising independently    Able to understand and use Dyspnea scale Yes Yes Yes Yes    Intervention Provide education and explanation on how to use Dyspnea scale Provide education and explanation on how to use Dyspnea scale Provide education and explanation on how to use Dyspnea scale Provide education and explanation on how to use Dyspnea scale     Expected Outcomes Short Term: Able to use Dyspnea scale daily in rehab to express subjective sense of shortness of breath during exertion;Long Term: Able to use Dyspnea scale to guide intensity level when exercising independently Short Term: Able to use Dyspnea scale daily in rehab to express subjective sense of shortness of breath during exertion;Long Term: Able to use Dyspnea scale to guide intensity level when exercising independently Short Term: Able to use Dyspnea scale daily in rehab to express subjective sense of shortness of breath during exertion;Long Term: Able to use Dyspnea scale to guide intensity level when exercising independently Short Term: Able to use Dyspnea scale daily in rehab to express subjective sense of shortness of breath during exertion;Long Term: Able to use Dyspnea scale to guide intensity level when exercising independently    Knowledge and understanding of Target Heart Rate Range (THRR) Yes Yes Yes  Yes    Intervention Provide education and explanation of THRR including how the numbers were predicted and where they are located for reference Provide education and explanation of THRR including how the numbers were predicted and where they are located for reference Provide education and explanation of THRR including how the numbers were predicted and where they are located for reference Provide education and explanation of THRR including how the numbers were predicted and where they are located for reference    Expected Outcomes Short Term: Able to state/look up THRR;Short Term: Able to use daily as guideline for intensity in rehab;Long Term: Able to use THRR to govern intensity when exercising independently Short Term: Able to state/look up THRR;Short Term: Able to use daily as guideline for intensity in rehab;Long Term: Able to use THRR to govern intensity when exercising independently Short Term: Able to state/look up THRR;Short Term: Able to use daily as guideline for intensity in  rehab;Long Term: Able to use THRR to govern intensity when exercising independently Short Term: Able to state/look up THRR;Short Term: Able to use daily as guideline for intensity in rehab;Long Term: Able to use THRR to govern intensity when exercising independently    Understanding of Exercise Prescription Yes Yes Yes Yes    Intervention Provide education, explanation, and written materials on patient's individual exercise prescription Provide education, explanation, and written materials on patient's individual exercise prescription Provide education, explanation, and written materials on patient's individual exercise prescription Provide education, explanation, and written materials on patient's individual exercise prescription    Expected Outcomes Short Term: Able to explain program exercise prescription;Long Term: Able to explain home exercise prescription to exercise independently Short Term: Able to explain program exercise prescription;Long Term: Able to explain home exercise prescription to exercise independently Short Term: Able to explain program exercise prescription;Long Term: Able to explain home exercise prescription to exercise independently Short Term: Able to explain program exercise prescription;Long Term: Able to explain home exercise prescription to exercise independently             Exercise Goals Re-Evaluation :  Exercise Goals Re-Evaluation     Row Name 01/15/23 0840 02/14/23 1600 03/11/23 1157         Exercise Goal Re-Evaluation   Exercise Goals Review Increase Physical Activity;Able to understand and use Dyspnea scale;Understanding of Exercise Prescription;Increase Strength and Stamina;Knowledge and understanding of Target Heart Rate Range (THRR);Able to understand and use rate of perceived exertion (RPE) scale Increase Physical Activity;Able to understand and use Dyspnea scale;Understanding of Exercise Prescription;Increase Strength and Stamina;Knowledge and  understanding of Target Heart Rate Range (THRR);Able to understand and use rate of perceived exertion (RPE) scale Increase Physical Activity;Able to understand and use Dyspnea scale;Understanding of Exercise Prescription;Increase Strength and Stamina;Knowledge and understanding of Target Heart Rate Range (THRR);Able to understand and use rate of perceived exertion (RPE) scale     Comments Pt is scheduled to begin exercise this week. Will continue to monitor and progress as able. Nim has completed 8 exercise sessions. He exercises for 30 min on the Nustep. Tamotsu averages 2 METs at level 2 on the Nustep. He perform the warmup and cooldown seated in his wheelchair. Eryk does an alternative warmup and cooldown due to his limits. He is postive and seems to enjoy the program. Silas is deconditioned as his progression will be limited to increasing his workload on the Nustep. Will continue to monitor and progress as able. Dair has completed  exercise sessions. He exercises for 30 min on the Nustep.  Lemar averages 2 METs at level 3 on the Nustep. He perform the warmup and cooldown seated in his wheelchair. Zarek does an alternative warmup and cooldown due to his limits. Imaan has increased his workload for the Nustep despite METs remaining the same. He remains positive even though his is very limited. Will continue to monitor and progress as able.     Expected Outcomes Through exercise at rehab and home, the patient will decrease shortness of breath with daily activities and feel confident in carrying out an exercise regimen at home. Through exercise at rehab and home, the patient will decrease shortness of breath with daily activities and feel confident in carrying out an exercise regimen at home. Through exercise at rehab and home, the patient will decrease shortness of breath with daily activities and feel confident in carrying out an exercise regimen at home.              Discharge Exercise  Prescription (Final Exercise Prescription Changes):  Exercise Prescription Changes - 03/19/23 1100       Response to Exercise   Blood Pressure (Admit) 100/58    Blood Pressure (Exercise) 100/56    Blood Pressure (Exit) 122/68    Heart Rate (Admit) 94 bpm    Heart Rate (Exercise) 112 bpm    Heart Rate (Exit) 9 bpm    Oxygen Saturation (Admit) 98 %    Oxygen Saturation (Exercise) 96 %    Oxygen Saturation (Exit) 97 %    Rating of Perceived Exertion (Exercise) 13    Perceived Dyspnea (Exercise) 1    Duration Continue with 30 min of aerobic exercise without signs/symptoms of physical distress.    Intensity THRR unchanged      Progression   Progression Continue to progress workloads to maintain intensity without signs/symptoms of physical distress.      Resistance Training   Training Prescription Yes    Weight blue bands    Reps 10-15    Time 10 Minutes      T5 Nustep   Level 1    Minutes 15    METs 1.9             Nutrition:  Target Goals: Understanding of nutrition guidelines, daily intake of sodium 1500mg , cholesterol 200mg , calories 30% from fat and 7% or less from saturated fats, daily to have 5 or more servings of fruits and vegetables.  Biometrics:    Nutrition Therapy Plan and Nutrition Goals:  Nutrition Therapy & Goals - 03/19/23 1029       Nutrition Therapy   Diet Heart healthy/Carbohydrate Consistent Diet    Drug/Food Interactions Statins/Certain Fruits      Personal Nutrition Goals   Nutrition Goal Patient to improve diet quality by using the plate method as a guide for meal planning to include lean protein/plant protein, fruits, vegetables, whole grains, nonfat dairy as part of a well-balanced diet.    Personal Goal #2 Patient to identify strategies for weight loss of 0.5-2.0# per week.    Personal Goal #3 Patient to limit sodium 2000mg  per day.    Comments Goals in action. Johnnie has started making many dietary changes including reduced sodium  intake, reduced refined carbohydrates/simple sugars. Reviewed strategies for weight loss including benefits of high fiber/high protein intake and reducing calories from carbohydrates and fat. He countinues mounjaro to aid with weight loss; he is down 4.2# since starting with our program. He does self report weights from home. Adolphe will benefit from participation in pulmonary  rehab for nutrition, exercise, and lifestyle modification.      Intervention Plan   Intervention Prescribe, educate and counsel regarding individualized specific dietary modifications aiming towards targeted core components such as weight, hypertension, lipid management, diabetes, heart failure and other comorbidities.;Nutrition handout(s) given to patient.    Expected Outcomes Short Term Goal: Understand basic principles of dietary content, such as calories, fat, sodium, cholesterol and nutrients.;Long Term Goal: Adherence to prescribed nutrition plan.             Nutrition Assessments:  MEDIFICTS Score Key: ?70 Need to make dietary changes  40-70 Heart Healthy Diet ? 40 Therapeutic Level Cholesterol Diet  Flowsheet Row PULMONARY REHAB OTHER RESPIRATORY from 01/22/2023 in [facility]  Picture Your Plate Total Score on Admission 52      Picture Your Plate Scores: <16 Unhealthy dietary pattern with much room for improvement. 41-50 Dietary pattern unlikely to meet recommendations for good health and room for improvement. 51-60 More healthful dietary pattern, with some room for improvement.  >60 Healthy dietary pattern, although there may be some specific behaviors that could be improved.    Nutrition Goals Re-Evaluation:  Nutrition Goals Re-Evaluation     Row Name 01/24/23 1201 02/19/23 1045 03/19/23 1029         Goals   Current Weight 267 lb 10.2 oz (121.4 kg) 257 lb 15 oz (117 kg) 266 lb 5.1 oz (120.8 kg)     Comment A1c WNL, lipoprotein A 147.2,  lipids WNL (LDL 73). no new labs; most recent labs A1c WNL, lipoprotein A 147.2, lipids WNL (LDL 73) no new labs; most recent labs A1c WNL, lipoprotein A 147.2, lipids WNL (LDL 73)     Expected Outcome Yohandry has started making many dietary changes including reduced sodium intake, reduced refined carbohydrates/simple sugars. He reports weight loss of ~50# over the last year through dietary changes; he reports highest weight ~315#. He started mounjaro to aid with weight loss. Jamary will benefit from participation in pulmonary rehab for nutrition, exercise, and lifestyle modification. Goals in action. Hermilo has started making many dietary changes including reduced sodium intake, reduced refined carbohydrates/simple sugars. Reviewed strategies for weight loss including benefits of high fiber/high protein intake and reducing calories from carbohydrates and fat. He countinues mounjaro to aid with weight loss. Wolf will benefit from participation in pulmonary rehab for nutrition, exercise, and lifestyle modification. Goals in action. Geordon has started making many dietary changes including reduced sodium intake, reduced refined carbohydrates/simple sugars. Reviewed strategies for weight loss including benefits of high fiber/high protein intake and reducing calories from carbohydrates and fat. He countinues mounjaro to aid with weight loss; he is down 4.2# since starting with our program. He does self report weights from home. Fern will benefit from participation in pulmonary rehab for nutrition, exercise, and lifestyle modification.              Nutrition Goals Discharge (Final Nutrition Goals Re-Evaluation):  Nutrition Goals Re-Evaluation - 03/19/23 1029       Goals   Current Weight 266 lb 5.1 oz (120.8 kg)    Comment no new labs; most recent labs A1c WNL, lipoprotein A 147.2, lipids WNL (LDL 73)    Expected Outcome Goals in action. Wasyl has started making many dietary changes including reduced  sodium intake, reduced refined carbohydrates/simple sugars. Reviewed strategies for weight loss including benefits of high fiber/high protein intake and reducing calories from carbohydrates and fat. He countinues  mounjaro to aid with weight loss; he is down 4.2# since starting with our program. He does self report weights from home. Armour will benefit from participation in pulmonary rehab for nutrition, exercise, and lifestyle modification.             Psychosocial: Target Goals: Acknowledge presence or absence of significant depression and/or stress, maximize coping skills, provide positive support system. Participant is able to verbalize types and ability to use techniques and skills needed for reducing stress and depression.  Initial Review & Psychosocial Screening:  Initial Psych Review & Screening - 01/14/23 1050       Initial Review   Current issues with Current Sleep Concerns      Family Dynamics   Good Support System? Yes    Comments Pt wears Cpap nightly. Can only tolerate for about 4 hours. Pt also stated he can only sleep on his back due to prior surgeries. Pt has sleeping meds he takes. He states that these do help.      Barriers   Psychosocial barriers to participate in program Psychosocial barriers identified (see note)      Screening Interventions   Interventions Encouraged to exercise    Expected Outcomes Long Term Goal: Stressors or current issues are controlled or eliminated.             Quality of Life Scores:  Scores of 19 and below usually indicate a poorer quality of life in these areas.  A difference of  2-3 points is a clinically meaningful difference.  A difference of 2-3 points in the total score of the Quality of Life Index has been associated with significant improvement in overall quality of life, self-image, physical symptoms, and general health in studies assessing change in quality of life.  PHQ-9: Review Flowsheet  More data exists       01/14/2023 11/02/2022 06/06/2022 05/25/2022 04/02/2022  Depression screen PHQ 2/9  Decreased Interest 0 0 0 0 0  Down, Depressed, Hopeless 0 0 0 0 0  PHQ - 2 Score 0 0 0 0 0  Altered sleeping 1 - - - -  Tired, decreased energy 1 - - - -  Change in appetite 0 - - - -  Feeling bad or failure about yourself  0 - - - -  Trouble concentrating 0 - - - -  Moving slowly or fidgety/restless 0 - - - -  Suicidal thoughts 0 - - - -  PHQ-9 Score 2 - - - -  Difficult doing work/chores Not difficult at all - - - -   Interpretation of Total Score  Total Score Depression Severity:  1-4 = Minimal depression, 5-9 = Mild depression, 10-14 = Moderate depression, 15-19 = Moderately severe depression, 20-27 = Severe depression   Psychosocial Evaluation and Intervention:  Psychosocial Evaluation - 01/14/23 1059       Psychosocial Evaluation & Interventions   Interventions Encouraged to exercise with the program and follow exercise prescription    Comments Pt admits to trouble sleeping, but stated it's better than it use to be. He is currently working with his MD for medication needed.    Expected Outcomes For Osby to attend PR without any psychosocial barriers or concerns.    Continue Psychosocial Services  No Follow up required             Psychosocial Re-Evaluation:  Psychosocial Re-Evaluation     Row Name 01/14/23 1249 02/07/23 1610 03/08/23 1353  Psychosocial Re-Evaluation   Current issues with Current Sleep Concerns Current Sleep Concerns Current Sleep Concerns     Comments No changes from orientation Branigan states that his sleep is improving despite only tolerating his Cpap for 4-5 hours each night. He can also only sleep on his back due to his previous back surgeries but says he gets more restful sleep. He is compliant with his sleeping meds and he stated they "seem to be working". St has great support from his wife who helps with his ADLs. He denies any other psychosocial issues.  Macyn states he has his "good" and "bad" days. 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 He states that his stress level is low and that he listens to music to decrease it. He is really enjoying Pulm Rehab and states he never thought that he would exercise again. Cosby also has great support from his wife who is also his caretaker.     Expected Outcomes For Amron to attend PR without any psychosocial barriers or concerns. For Montique to attend PR without any psychosocial barriers or concerns. For Syaire to continue to attend pulmonary rehab and to use positive coping mechanisms to manage his stress. To get better sleep and to be able to tolerate his Cpap for a longer time.     Interventions Encouraged to attend Pulmonary Rehabilitation for the exercise Encouraged to attend Pulmonary Rehabilitation for the exercise Encouraged to attend Pulmonary Rehabilitation for the exercise     Continue Psychosocial Services  No Follow up required No Follow up required No Follow up required              Psychosocial Discharge (Final Psychosocial Re-Evaluation):  Psychosocial Re-Evaluation - 03/08/23 1353       Psychosocial Re-Evaluation   Current issues with Current Sleep Concerns    Comments Wwlliam states he has his "good" and "bad" days. 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 He states that his stress level is low and that he listens to music to decrease it. He is really enjoying Pulm Rehab and states he never thought that he would exercise again. Koben also has great support from his wife who is also his caretaker.    Expected Outcomes For Trejuan to continue to attend pulmonary rehab and to use positive coping mechanisms to manage his stress. To get better sleep and to be able to tolerate his Cpap for a longer time.    Interventions Encouraged to attend Pulmonary  Rehabilitation for the exercise    Continue Psychosocial Services  No Follow up required             Education: Education Goals: Education classes will be provided on a weekly basis, covering required topics. Participant will state understanding/return demonstration of topics presented.  Learning Barriers/Preferences:   Education Topics: Introduction to Pulmonary Rehab Group instruction provided by PowerPoint, verbal discussion, and written material to support subject matter. Instructor reviews what Pulmonary Rehab is, the purpose of the program, and how patients are referred.     Know Your Numbers Group instruction that is supported by a PowerPoint presentation. Instructor discusses importance of knowing and understanding resting, exercise, and post-exercise oxygen saturation, heart rate, and blood pressure. Oxygen saturation, heart rate, blood pressure, rating of perceived exertion, and dyspnea are reviewed along with a normal range for these  values.    Exercise for the Pulmonary Patient Group instruction that is supported by a PowerPoint presentation. Instructor discusses benefits of exercise, core components of exercise, frequency, duration, and intensity of an exercise routine, importance of utilizing pulse oximetry during exercise, safety while exercising, and options of places to exercise outside of rehab.       MET Level  Group instruction provided by PowerPoint, verbal discussion, and written material to support subject matter. Instructor reviews what METs are and how to increase METs.  Flowsheet Row PULMONARY REHAB OTHER RESPIRATORY from 03/14/2023 in [place] [facility]  Date 03/14/23  Educator EP  Instruction Review Code 1- Verbalizes Understanding       Pulmonary Medications Verbally interactive group education provided by instructor with focus on inhaled medications and proper administration.   Anatomy and  Physiology of the Respiratory System Group instruction provided by PowerPoint, verbal discussion, and written material to support subject matter. Instructor reviews respiratory cycle and anatomical components of the respiratory system and their functions. Instructor also reviews differences in obstructive and restrictive respiratory diseases with examples of each.    Oxygen Safety Group instruction provided by PowerPoint, verbal discussion, and written material to support subject matter. There is an overview of "What is Oxygen" and "Why do we need it".  Instructor also reviews how to create a safe environment for oxygen use, the importance of using oxygen as prescribed, and the risks of noncompliance. There is a brief discussion on traveling with oxygen and resources the patient may utilize. Flowsheet Row PULMONARY REHAB OTHER RESPIRATORY from 01/24/2023 in [facility] for [facility], Vascular, [facility] Lung Health  Date 01/24/23  Educator RN  Instruction Review Code 1- Verbalizes Understanding       Oxygen Use Group instruction provided by PowerPoint, verbal discussion, and written material to discuss how supplemental oxygen is prescribed and different types of oxygen supply systems. Resources for more information are provided.  Flowsheet Row PULMONARY REHAB OTHER RESPIRATORY from 01/31/2023 in [facility] for Heart, Vascular, & Lung [facility]  Date 01/31/23  Educator RT  Instruction Review Code 1- Verbalizes Understanding       Breathing Techniques Group instruction that is supported by demonstration and informational handouts. Instructor discusses the benefits of pursed lip and diaphragmatic breathing and detailed demonstration on how to perform both.  Flowsheet Row PULMONARY REHAB OTHER RESPIRATORY from 02/14/2023 in [facility] for Heart, Vascular, & Lung Health  Date 02/14/23  Educator RT  Instruction Review Code 1- Verbalizes  Understanding        Risk Factor Reduction Group instruction that is supported by a PowerPoint presentation. Instructor discusses the definition of a risk factor, different risk factors for pulmonary disease, and how the heart and lungs work together. Flowsheet Row PULMONARY REHAB OTHER RESPIRATORY from 02/28/2023 in [facility] for Heart, Vascular, & Lung Health  Date 02/28/23  Educator EP  Instruction Review Code 1- Verbalizes Understanding       MD Day A group question and answer session with a medical doctor that allows participants to ask questions that relate to their pulmonary disease state.   Nutrition for the Pulmonary Patient Group instruction provided by PowerPoint slides, verbal discussion, and written materials to support subject matter. The instructor gives an explanation and review of healthy diet recommendations, which includes a discussion on weight management, recommendations for fruit and vegetable consumption, as well as protein,  fluid, caffeine, fiber, sodium, sugar, and alcohol. Tips for eating when patients are short of breath are discussed.    Other Education Group or individual verbal, written, or video instructions that support the educational goals of the pulmonary rehab program. Flowsheet Row PULMONARY REHAB OTHER RESPIRATORY from 03/07/2023 in [facility] for Heart, Vascular, & Lung Health  Date 03/07/23  Educator RN  Instruction Review Code 1- Verbalizes Understanding        Knowledge Questionnaire Score:  Knowledge Questionnaire Score - 01/14/23 1300       Knowledge Questionnaire Score   Pre Score 16/18             Core Components/Risk Factors/Patient Goals at Admission:  Personal Goals and Risk Factors at Admission - 01/14/23 1100       Core Components/Risk Factors/Patient Goals on Admission    Weight Management Weight Loss    Improve shortness of breath with ADL's Yes    Intervention  Provide education, individualized exercise plan and daily activity instruction to help decrease symptoms of SOB with activities of daily living.    Expected Outcomes Short Term: Improve cardiorespiratory fitness to achieve a reduction of symptoms when performing ADLs             Core Components/Risk Factors/Patient Goals Review:   Goals and Risk Factor Review     Row Name 01/14/23 1250 02/11/23 1044 02/11/23 1046 03/08/23 1405       Core Components/Risk Factors/Patient Goals Review   Personal Goals Review Weight Management/Obesity;Improve shortness of breath with ADL's;Develop more efficient breathing techniques such as purse lipped breathing and diaphragmatic breathing and practicing self-pacing with activity. -- Weight Management/Obesity;Improve shortness of breath with ADL's;Develop more efficient breathing techniques such as purse lipped breathing and diaphragmatic breathing and practicing self-pacing with activity. Weight Management/Obesity;Improve shortness of breath with ADL's    Review Unable to assess goals as patient has not started the program yet. He is scheduled to start on 01/22/2023 -- Nieves is working towards his goal of weight loss. He is currently working with our dietician. He has not made any progress towards his goal. Breland is also working on improving his shortness of breath with his ADLs. [place] stated that he is slowly building stamina and can see a difference when he bathes. Jacee can initiate purse lipped & diaphragmatic breathing when he becomes short of breath. He can also self-pace with activity when he becomes short of breath. These goals have been met by the patient. Amarey has lost weight since he started the program. Goal in progress (see dietician note). He is working on improving his shortness of breath with ADLs to get back to where he previously was. Goal in progress. His oxygen saturation has been stable on RA while exercising. Vestel loves participating in  [place] and looks forward to coming.    Expected Outcomes See Admission goals -- See Admission goals See Admission goals             Core Components/Risk Factors/Patient Goals at Discharge (Final Review):   Goals and Risk Factor Review - 03/08/23 1405       Core Components/Risk Factors/Patient Goals Review   Personal Goals Review Weight Management/Obesity;Improve shortness of breath with ADL's    Review Larri has lost weight since he started the program. Goal in progress (see dietician note). He is working on improving his shortness of breath with ADLs to get back to where he previously was. Goal in progress. His oxygen saturation  has been stable on RA while exercising. Evangelos loves participating in [place] and looks forward to coming.    Expected Outcomes See Admission goals             ITP Comments:Pt is making expected progress toward Pulmonary Rehab goals after completing 16 sessions. Recommend continued exercise, life style modification, education, and utilization of breathing techniques to increase stamina and strength, while also decreasing shortness of breath with exertion.  Dr. Mechele Collin is Medical Director for Pulmonary Rehab at [facility].

## 2023-03-20 NOTE — Assessment & Plan Note (Addendum)
This is a chronic problem. We discussed possible etiologies. Left is worse than right, history of LLE DVT, which is most likely contributing to problem. Currently he is on torsemide 20 mg 1/2 tablet daily. Recommend compression stockings and continue appropriate skin care.

## 2023-03-21 ENCOUNTER — Other Ambulatory Visit: Payer: Self-pay | Admitting: Physical Medicine and Rehabilitation

## 2023-03-21 ENCOUNTER — Encounter (HOSPITAL_COMMUNITY): Payer: Medicare Other

## 2023-03-21 ENCOUNTER — Other Ambulatory Visit: Payer: Medicare Other

## 2023-03-21 DIAGNOSIS — E1140 Type 2 diabetes mellitus with diabetic neuropathy, unspecified: Secondary | ICD-10-CM

## 2023-03-21 LAB — MICROALBUMIN / CREATININE URINE RATIO
Creatinine,U: 133.7 mg/dL
Microalb Creat Ratio: 1.4 mg/g (ref 0.0–30.0)
Microalb, Ur: 1.9 mg/dL (ref 0.0–1.9)

## 2023-03-23 ENCOUNTER — Other Ambulatory Visit: Payer: Self-pay | Admitting: Physical Medicine and Rehabilitation

## 2023-03-23 NOTE — Assessment & Plan Note (Signed)
BP adequately controlled. Continue carvedilol 12.5 mg twice daily,Spironolactone 25 mg daily, and Amlodipine 10 mg daily. Continue low-salt diet. Monitor BP regularly. Eye exam is current.

## 2023-03-26 ENCOUNTER — Encounter (HOSPITAL_COMMUNITY): Payer: Medicare Other

## 2023-03-28 ENCOUNTER — Telehealth (HOSPITAL_COMMUNITY): Payer: Self-pay

## 2023-03-28 ENCOUNTER — Encounter (HOSPITAL_COMMUNITY): Payer: Medicare Other

## 2023-03-28 NOTE — Telephone Encounter (Signed)
Called to check on patient after missing a couple Pulm Rehab classes. Keith [name] stated that he has been having hip and elbow pain and was advised to take a week off. Pt stated he would be back in class on Tuesday.

## 2023-04-02 ENCOUNTER — Encounter (HOSPITAL_COMMUNITY)
Admission: RE | Admit: 2023-04-02 | Discharge: 2023-04-02 | Disposition: A | Discharge status: Home / Self Care | Payer: Medicare Other | Source: Ambulatory Visit | Attending: Cardiology | Admitting: Cardiology

## 2023-04-02 VITALS — Wt 265.4 lb

## 2023-04-02 DIAGNOSIS — I272 Pulmonary hypertension, unspecified: Secondary | ICD-10-CM | POA: Diagnosis not present

## 2023-04-02 NOTE — Progress Notes (Signed)
Daily Session Note  Patient Details  Name: Keith [name] MRN: [id number] Date of Birth: 1951/06/27 Referring Provider:   Doristine Devoid Pulmonary Rehab Walk Test from 01/14/2023 in [facility]  Referring Provider [name]       Encounter Date: 04/02/2023  Check In:  Session Check In - 04/02/23 0957       Check-In   Supervising physician immediately available to respond to emergencies [facility] MD immediately available    Physician(s) Edd Fabian, NP    Location [facility]    Staff Present Raford Pitcher, MS, ACSM-CEP, Exercise Physiologist;Akash Winski Synthia Innocent, RN, BSN    Virtual Visit No    Medication changes reported     No    Fall or balance concerns reported    Yes    Comments Pt primarily uses wheelchair. Unstable with walker, gait belt used for transfers    Tobacco Cessation No Change    Warm-up and Cool-down Performed as group-led instruction    Resistance Training Performed Yes    VAD Patient? No    PAD/SET Patient? No      Pain Assessment   Currently in Pain? Yes    Pain Score 5     Pain Location Elbow    Pain Orientation Left    Pain Descriptors / Indicators Aching    Pain Type Chronic pain    Multiple Pain Sites No             Capillary Blood Glucose: No results found for this or any previous visit (from the past 24 hour(s)).   Exercise Prescription Changes - 04/02/23 1100       Response to Exercise   Blood Pressure (Admit) 100/58    Blood Pressure (Exercise) 112/64    Blood Pressure (Exit) 100/64    Heart Rate (Admit) 96 bpm    Heart Rate (Exercise) 107 bpm    Heart Rate (Exit) 95 bpm    Oxygen Saturation (Admit) 98 %    Oxygen Saturation (Exercise) 95 %    Oxygen Saturation (Exit) 99 %    Rating of Perceived Exertion (Exercise) 10    Perceived Dyspnea (Exercise) 1    Duration Continue with 30 min of aerobic exercise without signs/symptoms of physical distress.     Intensity THRR unchanged      Progression   Progression Continue to progress workloads to maintain intensity without signs/symptoms of physical distress.      Resistance Training   Training Prescription Yes    Weight blue bands    Reps 10-15    Time 10 Minutes      T5 Nustep   Level 4    Minutes 15    METs 2             Social History   Tobacco Use  Smoking Status Former   Packs/day: 1.00   Years: 25.00   Additional pack years: 0.00   Total pack years: 25.00   Types: Cigarettes   Quit date: 68   Years since quitting: 29.5   Passive exposure: Never  Smokeless Tobacco Never    Goals Met:  Proper associated with RPD/PD & O2 Sat Independence with exercise equipment Exercise tolerated well No report of concerns or symptoms today Strength training completed today  Goals Unmet:  Not Applicable  Comments: Service time is from 1008 to 1140.    Dr. Mechele Collin is Medical Director for Pulmonary Rehab at  [facility].

## 2023-04-03 ENCOUNTER — Ambulatory Visit: Payer: Medicare Other | Admitting: Physician Assistant

## 2023-04-03 ENCOUNTER — Telehealth: Payer: Self-pay

## 2023-04-03 ENCOUNTER — Encounter: Payer: Self-pay | Admitting: Physician Assistant

## 2023-04-03 VITALS — BP 86/58 | HR 100 | Ht 70.0 in | Wt 264.5 lb

## 2023-04-03 DIAGNOSIS — N183 Chronic kidney disease, stage 3 unspecified: Secondary | ICD-10-CM

## 2023-04-03 DIAGNOSIS — Z7901 Long term (current) use of anticoagulants: Secondary | ICD-10-CM

## 2023-04-03 DIAGNOSIS — Z86010 Personal history of colonic polyps: Secondary | ICD-10-CM | POA: Diagnosis not present

## 2023-04-03 DIAGNOSIS — E1140 Type 2 diabetes mellitus with diabetic neuropathy, unspecified: Secondary | ICD-10-CM

## 2023-04-03 DIAGNOSIS — I272 Pulmonary hypertension, unspecified: Secondary | ICD-10-CM

## 2023-04-03 NOTE — Telephone Encounter (Signed)
[facility] Pre-operative Risk Assessment     Request for surgical clearance:     Endoscopy Procedure  What type of surgery is being performed?     Colonoscopy  When is this surgery scheduled?     06-27-2023  What type of clearance is required ?   Pharmacy  Are there any medications that need to be held prior to surgery and how long? Eliquis 2 days hold  Practice name and name of physician performing surgery?      [facility]  What is your office phone and fax number?      Phone- [phone]  Fax- [phone]  Anesthesia type (None, local, MAC, general) ?       MAC

## 2023-04-03 NOTE — Telephone Encounter (Signed)
Response forwarded to GI, will f/u on VQ and get arranged

## 2023-04-03 NOTE — Patient Instructions (Signed)
_______________________________________________________  If your blood pressure at your visit was 140/90 or greater, please contact your primary care physician to follow up on this.  _______________________________________________________  If you are age 71 or older, your body mass index should be between 23-30. Your Body mass index is 37.95 kg/m. If this is out of the aforementioned range listed, please consider follow up with your Primary Care Provider.  If you are age 31 or younger, your body mass index should be between 19-25. Your Body mass index is 37.95 kg/m. If this is out of the aformentioned range listed, please consider follow up with your Primary Care Provider.   ________________________________________________________  The [facility] providers would like to encourage you to use [facility] to communicate with providers for non-urgent requests or questions.  Due to long hold times on the telephone, sending your provider a message by [facility] may be a faster and more efficient way to get a response.  Please allow 48 business hours for a response.  Please remember that this is for non-urgent requests.  _______________________________________________________  Bonita Quin have been scheduled for a colonoscopy. Please follow written instructions given to you at your visit today.  Please pick up your prep supplies at the pharmacy within the next 1-3 days. If you use inhalers (even only as needed), please bring them with you on the day of your procedure.  You will be contacted by our office prior to your procedure for directions on holding your Eliquis and pulmonary clearance. If you do not hear from our office 2 week prior to your scheduled procedure, please call [phone] to discuss.   _  _   ORAL DIABETIC MEDICATION INSTRUCTIONS  The day before your procedure:  Take your diabetic pill as you do normally  The day of your procedure:  Do not take your diabetic pill   We will check your  blood sugar levels during the admission process and again in Recovery before discharging you home  ______________________________________________________________________  _  _   INSULIN (LONG ACTING) MEDICATION INSTRUCTIONS (Lantus, NPH, 70/30, Humulin, Novolin-N, Levemir, Toujeo, Johann Capers )   The day before your procedure:  Take  your regular evening dose    The day of your procedure:  Do not take your morning dose   _  _   INSULIN (SHORT ACTING) MEDICATION INSTRUCTIONS (Regular, Humulog, Novolog, Apidra, Novolin, Humulin)   The day before your procedure:  Do not take your evening dose   The day of your procedure:  Do not take your morning dose  ______________________________________________________________________                _ _ OTHER NON-INSULIN GLPI AGONIST INJECTABLE DAILY MEDICATIONS         (Tanzeum, Saxenda Byetta, Victoza, SymlinPen, Rebelsus)  Hold the day of the procedure ______________________________________________________________________________________________________________________________________________________     __  __ GLP 1 AGONIST Weekly injectables                   (Bydureon Bcise, [place], [place], [place], [place], [place])    Hold for 7 days prior to the procedure    It was a pleasure to see you today!  Thank you for trusting me with your gastrointestinal care!

## 2023-04-03 NOTE — Progress Notes (Signed)
Chief Complaint: Discuss colonoscopy in a patient on chronic anticoagulation  HPI:    Keith [name] is a 72 year old African-American male, known to Dr. Russella Dar, with a past medical history as listed below including CHF, pulmonary hypertension, diabetes, esophageal stricture, PE on Eliquis, who was referred to me by [place], [name], MD for consideration of repeat colonoscopy due to history of polyps.    01/20/2018 colonoscopy for surveillance of adenomatous polyps with one 8 mm polyp in the mid transverse colon, single solitary ulcer in the distal transverse colon, moderate diverticulosis in left colon and internal hemorrhoids.  Repeat recommended in 5 years for surveillance.    06/25/2022 echo done for CHF and pulmonary hypertension with LVEF 55-60%.    01/17/2023 video visit with cardiology for his OSA.  Doing well on his CPAP.    Today, patient presents to clinic accompanied by his wife.  He tells me over the past year has had a lot going on.  He is currently in pulmonary rehab for pulmonary hypertension and doing well but tells me if the weather changes or gets too hot he has some trouble breathing.  He is pretty much wheelchair-bound after his third back surgery last year, he can stand and pivot but is not sure that he could transfer himself from the wheelchair to a bed.  Denies any acute GI complaints or concerns.  Occasional constipation which is resolved with a "laxative cookie".  Patient has changed his entire diet over the past year after his diagnosis of congestive heart failure and is lost almost 100 pounds.    Denies fever, chills, blood in his stool, nausea or vomiting.  Past Medical History:  Diagnosis Date   Allergy    CHF (congestive heart failure) (HCC)    Chronic back pain    Diabetes mellitus without complication (HCC)    DNR (do not resuscitate) 11/19/2020   Duodenal ulcer hemorrhage 08/29/2014   ED (erectile dysfunction)    Esophageal stricture 08/30/2014   Glucose intolerance  (impaired glucose tolerance)    Hiatal hernia 08/30/2014   Hypertension    Hypokalemia 04/11/2013   MRSA carrier 08/30/2014   Obesity    Osteoarthritis    Pneumonia    Pulmonary embolism (HCC) 06/2020   Scoliosis 2016   Spinal stenosis of lumbar region    Urinary tract infection 04/11/2013    Past Surgical History:  Procedure Laterality Date   BACK SURGERY  08/07/2021   COLONOSCOPY  2008   ESOPHAGOGASTRODUODENOSCOPY N/A 08/29/2014   Procedure: ESOPHAGOGASTRODUODENOSCOPY (EGD);  Surgeon: Hart Carwin, MD;  Location: [facility];  Service: Endoscopy;  Laterality: N/A;   LAMINECTOMY N/A 07/28/2018   Procedure: THORACIC ELEVEN- THORACIC TWELVE POSTERIOR DECOMPRESSION LAMINECTOMY;  Surgeon: Jadene Pierini, MD;  Location: [facility] OR;  Service: Neurosurgery;  Laterality: N/A;   LUMBAR LAMINECTOMY     NASAL POLYP SURGERY     ORIF ANKLE FRACTURE Right 07/01/2020   Procedure: OPEN REDUCTION INTERNAL FIXATION (ORIF) ANKLE FRACTURE. TRIMALLEOLAR;  Surgeon: Teryl Lucy, MD;  Location: [facility] OR;  Service: Orthopedics;  Laterality: Right;   RIGHT HEART CATH N/A 07/06/2022   Procedure: RIGHT HEART CATH;  Surgeon: Laurey Morale, MD;  Location: [facility] INVASIVE CV LAB;  Service: Cardiovascular;  Laterality: N/A;   RIGHT/LEFT HEART CATH AND CORONARY ANGIOGRAPHY N/A 03/02/2022   Procedure: RIGHT/LEFT HEART CATH AND CORONARY ANGIOGRAPHY;  Surgeon: Orpah Cobb, MD;  Location: [facility] INVASIVE CV LAB;  Service: Cardiovascular;  Laterality: N/A;   TONSILLECTOMY  Current Outpatient Medications  Medication Sig Dispense Refill   acetaminophen (TYLENOL) 325 MG tablet Take 1-2 tablets (325-650 mg total) by mouth every 4 (four) hours as needed for mild pain.     albuterol (VENTOLIN HFA) 108 (90 Base) MCG/ACT inhaler Inhale 2 puffs into the lungs every 6 (six) hours as needed for wheezing or shortness of breath. 8 g 1   amLODipine (NORVASC) 10 MG tablet TAKE 1 TABLET BY MOUTH DAILY 100 tablet 2   apixaban  (ELIQUIS) 5 MG TABS tablet Take 1 tablet (5 mg total) by mouth 2 (two) times daily. 60 tablet 11   Blood Glucose Monitoring Suppl (ACCU-CHEK GUIDE ME) w/Device KIT USE TO TEST BLOOD SUGARS 1-2 TIMES DAILY. 1 kit 0   carvedilol (COREG) 12.5 MG tablet Take 1 tablet (12.5 mg total) by mouth 2 (two) times daily with a meal. 180 tablet 3   cyanocobalamin (VITAMIN B12) 1000 MCG tablet Take 1,000 mcg by mouth daily.     DULoxetine (CYMBALTA) 60 MG capsule TAKE 1 CAPSULE BY MOUTH EVERY DAY 90 capsule 0   ferrous sulfate 325 (65 FE) MG tablet TAKE 1 TABLET BY MOUTH EVERY DAY WITH BREAKFAST 90 tablet 1   gabapentin (NEURONTIN) 600 MG tablet Take 600 mg by mouth 3 (three) times daily.     HYDROcodone-acetaminophen (NORCO) 7.5-325 MG tablet Take 1 tablet by mouth in the morning, at noon, in the evening, and at bedtime. 7.5 mg/375 mg     hydrocortisone (ANUSOL-HC) 2.5 % rectal cream Place 1 application. rectally as needed for hemorrhoids or anal itching.     levETIRAcetam (KEPPRA) 250 MG tablet Take 1 tablet (250 mg total) by mouth 2 (two) times daily. 60 tablet 1   lidocaine (LIDODERM) 5 % Place 1 patch onto the skin daily as needed (pain). Remove & Discard patch within 12 hours or as directed by MD     naloxone (NARCAN) nasal spray 4 mg/0.1 mL Place 1 spray into the nose as needed (accidental overdose).     omeprazole (PRILOSEC) 40 MG capsule TAKE 1 CAPSULE BY MOUTH DAILY 100 capsule 2   polyethylene glycol (MIRALAX / GLYCOLAX) 17 g packet Take 17 g by mouth daily as needed for mild constipation.     rOPINIRole (REQUIP) 1 MG tablet Take 1 tablet (1 mg total) by mouth at bedtime. 90 tablet 2   rosuvastatin (CRESTOR) 20 MG tablet Take 1 tablet (20 mg total) by mouth daily. 90 tablet 3   spironolactone (ALDACTONE) 25 MG tablet Take 1 tablet (25 mg total) by mouth daily. 90 tablet 3   tirzepatide (MOUNJARO) 5 MG/0.5ML Pen Inject 5 mg into the skin once a week. 2 mL 11   torsemide (DEMADEX) 20 MG tablet TAKE 1 AND  1/2 TABLETS DAILY BY MOUTH 135 tablet 2   traZODone (DESYREL) 50 MG tablet Take 0.5-1 tablets (25-50 mg total) by mouth at bedtime as needed. for sleep 90 tablet 0   No current facility-administered medications for this visit.    Allergies as of 04/03/2023 - Review Complete 04/03/2023  Allergen Reaction Noted   Aspirin Other (See Comments) 11/19/2020   Lisinopril Other (See Comments) 02/12/2017    Family History  Problem Relation Age of Onset   Diabetes Mother    Asthma Mother    Breast cancer Mother    Hypertension Father    Stroke Father    Colon cancer Neg Hx     Social History   Socioeconomic History   Marital status:  Married    Spouse name: Wynona Canes   Number of children: 5   Years of education: 4 years college   Highest education level: Bachelor's degree (e.g., BA, AB, BS)  Occupational History   Occupation: retired  Tobacco Use   Smoking status: Former    Packs/day: 1.00    Years: 25.00    Additional pack years: 0.00    Total pack years: 25.00    Types: Cigarettes    Quit date: 1995    Years since quitting: 29.5    Passive exposure: Never   Smokeless tobacco: Never  Vaping Use   Vaping Use: Never used  Substance and Sexual Activity   Alcohol use: Not Currently    Comment: holidays and special occasions   Drug use: Not Currently   Sexual activity: Yes    Birth control/protection: None  Other Topics Concern   Not on file  Social History Narrative   Married, 5 kids, 14 grand, 2 great grand, watch TV, Armed forces operational officer, traveling   Social Determinants of Health   Financial Resource Strain: Low Risk  (03/16/2023)   Overall Financial Resource Strain (CARDIA)    Difficulty of Paying Living Expenses: Not hard at all  Food Insecurity: No Food Insecurity (03/16/2023)   Hunger Vital Sign    Worried About Running Out of Food in the Last Year: Never true    Ran Out of Food in the Last Year: Never true  Transportation Needs: No Transportation Needs  (03/16/2023)   PRAPARE - Administrator, Civil Service (Medical): No    Lack of Transportation (Non-Medical): No  Physical Activity: Insufficiently Active (03/16/2023)   Exercise Vital Sign    Days of Exercise per Week: 3 days    Minutes of Exercise per Session: 30 min  Stress: No Stress Concern Present (03/16/2023)   [employer] of Occupational Health - Occupational Stress Questionnaire    Feeling of Stress : Not at all  Social Connections: Moderately Integrated (03/16/2023)   Social Connection and Isolation Panel [NHANES]    Frequency of Communication with Friends and Family: More than three times a week    Frequency of Social Gatherings with Friends and Family: Twice a week    Attends Religious Services: More than 4 times per year    Active Member of [employer] or Organizations: No    Attends Engineer, structural: Not on file    Marital Status: Married  Catering manager Violence: Not At Risk (10/23/2022)   Humiliation, Afraid, Rape, and Kick questionnaire    Fear of Current or Ex-Partner: No    Emotionally Abused: No    Physically Abused: No    Sexually Abused: No    Review of Systems:    Constitutional: No weight loss, fever or chills Skin: No rash  Cardiovascular: No chest pain Respiratory: No SOB  Gastrointestinal: See HPI and otherwise negative Genitourinary: No dysuria  Neurological: No headache, dizziness or syncope Musculoskeletal: No new muscle or joint pain Hematologic: No bleeding  Psychiatric: No history of depression or anxiety   Physical Exam:  Vital signs: BP (!) 86/58 (BP Location: Left Arm, Patient Position: Sitting, Cuff Size: Large)   Pulse 100   Ht 5\' 10"  (1.778 m) Comment: unable to obtain-wheelchair  Wt 264 lb 8 oz (120 kg) Comment: pt stated, unable to obtain-wheelchair  BMI 37.95 kg/m    Constitutional:   Pleasant AA obese male appears to be in NAD, Well developed, Well nourished, alert and cooperative in  wheelchair Head:   Normocephalic and atraumatic. Eyes:   PEERL, EOMI. No icterus. Conjunctiva pink. Ears:  Normal auditory acuity. Neck:  Supple Throat: Oral cavity and pharynx without inflammation, swelling or lesion.  Respiratory: Respirations even and unlabored. Lungs clear to auscultation bilaterally.   No wheezes, crackles, or rhonchi.  Cardiovascular: Normal S1, S2. No MRG. Regular rate and rhythm. No peripheral edema, cyanosis or pallor.  Gastrointestinal:  Soft, nondistended, nontender. No rebound or guarding. Normal bowel sounds. No appreciable masses or hepatomegaly. Rectal:  Not performed.  Msk:  Symmetrical without gross deformities. Without edema, no deformity or joint abnormality. +wheelchair Neurologic:  Alert and  oriented x4;  grossly normal neurologically.  Skin:   Dry and intact without significant lesions or rashes. Psychiatric:  Demonstrates good judgement and reason without abnormal affect or behaviors.  RELEVANT LABS AND IMAGING: CBC    Component Value Date/Time   WBC 7.6 11/02/2022 1428   RBC 3.55 (L) 11/02/2022 1428   HGB 11.0 (L) 11/02/2022 1428   HCT 33.7 (L) 11/02/2022 1428   PLT 412.0 (H) 11/02/2022 1428   MCV 95.2 11/02/2022 1428   MCH 30.7 10/25/2022 0634   MCHC 32.7 11/02/2022 1428   RDW 14.1 11/02/2022 1428   LYMPHSABS 0.4 (L) 10/23/2022 1000   MONOABS 1.8 (H) 10/23/2022 1000   EOSABS 0.0 10/23/2022 1000   BASOSABS 0.0 10/23/2022 1000    CMP     Component Value Date/Time   NA 139 12/24/2022 0934   K 4.1 12/24/2022 0934   CL 110 12/24/2022 0934   CO2 22 12/24/2022 0934   GLUCOSE 124 (H) 12/24/2022 0934   GLUCOSE 100 (H) 09/09/2006 0956   BUN 21 12/24/2022 0934   CREATININE 1.36 (H) 12/24/2022 0934   CREATININE 1.70 (H) 03/09/2022 1542   CALCIUM 9.6 12/24/2022 0934   PROT 8.3 (H) 10/23/2022 1000   ALBUMIN 4.1 10/23/2022 1000   AST 29 10/23/2022 1000   ALT 22 10/23/2022 1000   ALKPHOS 72 10/23/2022 1000   BILITOT 0.4 10/23/2022 1000   GFRNONAA 56 (L)  12/24/2022 0934   GFRAA >60 07/11/2020 0619    Assessment: 1.  History of adenomatous polyps: Last colonoscopy in 2019 and repeat recommended in 5 years 2.  History of DVT/PE on Eliquis 3.  Pulmonary hypertension 4.  CHF  Plan: 1.  Patient will be scheduled for a colonoscopy at the hospital given that he is in a wheelchair and cannot really ambulate on his own/transfer.  This will be scheduled with Dr. Russella Dar when he has availability, it looks like September at this point.  Did provide the patient a detailed list of risks for his procedure and he agrees to proceed. 2.  We will communicate with his cardiologist in regards to his pulmonary hypertension and cardiac status as well as Eliquis.  Will make sure that he has cardiac clearance as well as advised him that he will need to hold his Eliquis for 2 days prior to time of procedure. 3.  Patient to follow in clinic per recommendations after time of procedure.  Hyacinth Meeker, PA-C [facility] 04/03/2023, 10:04 AM  Cc: [place], [name], MD

## 2023-04-03 NOTE — Telephone Encounter (Signed)
   Patient Name: Keith [name]  DOB: 10-04-1951 MRN: [id number]  Primary Cardiologist: None  Chart reviewed as part of pre-operative protocol coverage.   Patient is on Eliquis for history of pulmonary embolism and is currently managed by cardiology.  Please contact the prescribing provider for guidance on holding Eliquis for upcoming procedure.  Please let us know if you have any additional questions.   Napoleon Form, Leodis Rains, NP 04/03/2023, 11:10 AM

## 2023-04-03 NOTE — Telephone Encounter (Signed)
Pt on Eliquis for pulm htn, h/o PE's  OV note March '24: Continue Eliquis for h/o PEs.  - Still need to get V/Q scan to assess for chronic PEs, will order again today. If V/Q scan is concerning for chronic thromboembolic disease, will need to consider referral to [facility] for balloon pulmonary angioplasty (would not be good candidate for pulmonary thromboendarterectomy). CTA chest done in 1/24 did not show evidence for PE.   VQ scan not done yet,   Dr Eileen Stanford advise on holding eliquis for colonoscopy

## 2023-04-04 ENCOUNTER — Encounter (HOSPITAL_COMMUNITY)
Admission: RE | Admit: 2023-04-04 | Discharge: 2023-04-04 | Disposition: A | Discharge status: Home / Self Care | Payer: Medicare Other | Source: Ambulatory Visit | Attending: Cardiology | Admitting: Cardiology

## 2023-04-04 ENCOUNTER — Telehealth: Payer: Self-pay

## 2023-04-04 DIAGNOSIS — I272 Pulmonary hypertension, unspecified: Secondary | ICD-10-CM

## 2023-04-04 NOTE — Telephone Encounter (Addendum)
Sounds good to me. I do have a question regarding that test he hasn't done yet will he needs to have that done before the procedure? I know the patient is leaving for vacation I believe the beginning part of September and also I am in need of a pulmonary clearance as well so I know he does the pulmonary rehab through cardiology so I was wondering who will grant that clearance? Please advise

## 2023-04-04 NOTE — Telephone Encounter (Signed)
The pt returned call and has been advised that the colon has been cancelled and recall entered for April 16109 (7 years after last colon) The pt agrees to this plan and will call if he has any concerns prior to that time.

## 2023-04-04 NOTE — Telephone Encounter (Signed)
-----   Message from Meryl Dare, MD sent at 04/04/2023  7:50 AM EDT -----    ----- Message ----- From: Unk Lightning, [place] Sent: 04/03/2023  10:50 AM EDT To: Meryl Dare, MD

## 2023-04-04 NOTE — Telephone Encounter (Signed)
The case has been cancelled   Left message on machine to call back

## 2023-04-04 NOTE — Progress Notes (Signed)
Reviewed office note. He is higher risk for endoscopic procedures and anesthesia due his multiple comorbidities, Eliquis. Please contact him to review the updated polyps surveillance guidelines which include a 7-10 year colonoscopy for one small tubular adenoma. I recommend a 7 year interval colonoscopy for him. If he has a strong preference to stay with the 5 year interval I recommend deferring his colonoscopy until he completes pulmonary rehab and has returned to his pulmonologist, cardiologist and has an office appt with [place] in 6 months to reassess.    Venita Lick. Russella Dar, MD [facility]

## 2023-04-04 NOTE — Progress Notes (Signed)
Daily Session Note  Patient Details  Name: Keith [name] MRN: [id number] Date of Birth: Apr 23, 1951 Referring Provider:   Doristine Devoid Pulmonary Rehab Walk Test from 01/14/2023 in [facility]  Referring Provider [name]       Encounter Date: 04/04/2023  Check In:  Session Check In - 04/04/23 1034       Check-In   Supervising physician immediately available to respond to emergencies [facility] MD immediately available    Physician(s) Robin Searing, NP    Location [facility]    Staff Present Samantha Belarus, RD, Dutch Gray, RN, BSN;Randi Reeve BS, ACSM-CEP, Exercise Physiologist;Kaylee Earlene Plater, MS, ACSM-CEP, Exercise Physiologist;Leatrice Parilla Katrinka Blazing, RT    Virtual Visit No    Medication changes reported     No    Fall or balance concerns reported    Yes    Comments Pt primarily uses wheelchair. Unstable with walker, gait belt used for transfers    Tobacco Cessation No Change    Warm-up and Cool-down Performed as group-led instruction    Resistance Training Performed Yes    VAD Patient? No    PAD/SET Patient? No      Pain Assessment   Currently in Pain? No/denies    Multiple Pain Sites No             Capillary Blood Glucose: No results found for this or any previous visit (from the past 24 hour(s)).    Social History   Tobacco Use  Smoking Status Former   Packs/day: 1.00   Years: 25.00   Additional pack years: 0.00   Total pack years: 25.00   Types: Cigarettes   Quit date: 59   Years since quitting: 29.5   Passive exposure: Never  Smokeless Tobacco Never    Goals Met:  Proper associated with RPD/PD & O2 Sat Independence with exercise equipment Exercise tolerated well No report of concerns or symptoms today Strength training completed today  Goals Unmet:  Not Applicable  Comments: Service time is from 1011 to 1140.    Dr. Mechele Collin is Medical Director for Pulmonary Rehab at  [facility].

## 2023-04-04 NOTE — Telephone Encounter (Signed)
Author: Meryl Dare, MD Service: Gastroenterology Author Type: Physician  Filed: 04/04/2023  7:58 AM Encounter Date: 04/03/2023 Status: Signed  Editor: Meryl Dare, MD (Physician)   Reviewed office note. He is higher risk for endoscopic procedures and anesthesia due his multiple comorbidities, Eliquis. Please contact him to review the updated polyps surveillance guidelines which include a 7-10 year colonoscopy for one small tubular adenoma. I recommend a 7 year interval colonoscopy for him. If he has a strong preference to stay with the 5 year interval I recommend deferring his colonoscopy until he completes pulmonary rehab and has returned to his pulmonologist, cardiologist and has an office appt with [place] in 6 months to reassess.     Venita Lick. Russella Dar, MD [facility]

## 2023-04-05 ENCOUNTER — Other Ambulatory Visit (HOSPITAL_COMMUNITY): Payer: Self-pay

## 2023-04-05 MED ORDER — HYDROCODONE-ACETAMINOPHEN 7.5-325 MG PO TABS
1.0000 | ORAL_TABLET | Freq: Four times a day (QID) | ORAL | 0 refills | Status: DC | PRN
  Filled 2023-04-05: qty 120, 30d supply, fill #0

## 2023-04-08 ENCOUNTER — Other Ambulatory Visit: Payer: Self-pay | Admitting: Pharmacist

## 2023-04-08 ENCOUNTER — Other Ambulatory Visit: Payer: Self-pay | Admitting: Cardiology

## 2023-04-08 MED ORDER — MOUNJARO 5 MG/0.5ML ~~LOC~~ SOAJ: 5.0000 mg | SUBCUTANEOUS | 11 refills | Status: DC

## 2023-04-09 ENCOUNTER — Other Ambulatory Visit: Payer: Self-pay | Admitting: Cardiology

## 2023-04-09 ENCOUNTER — Encounter (HOSPITAL_COMMUNITY)
Admission: RE | Admit: 2023-04-09 | Discharge: 2023-04-09 | Disposition: A | Discharge status: Home / Self Care | Payer: Medicare Other | Source: Ambulatory Visit | Attending: Cardiology | Admitting: Cardiology

## 2023-04-09 ENCOUNTER — Other Ambulatory Visit (HOSPITAL_COMMUNITY): Payer: Self-pay | Admitting: Pharmacist

## 2023-04-09 DIAGNOSIS — I272 Pulmonary hypertension, unspecified: Secondary | ICD-10-CM | POA: Insufficient documentation

## 2023-04-09 MED ORDER — ADEMPAS 2 MG PO TABS: 2.0000 mg | ORAL_TABLET | Freq: Three times a day (TID) | ORAL | 11 refills | Status: DC

## 2023-04-09 NOTE — Telephone Encounter (Signed)
Nevermind we cancelled the procedure

## 2023-04-09 NOTE — Progress Notes (Signed)
Daily Session Note  Patient Details  Name: Keith [name] MRN: [id number] Date of Birth: July 16, 1951 Referring Provider:   Doristine Devoid Pulmonary Rehab Walk Test from 01/14/2023 in [facility]  Referring Provider [name]       Encounter Date: 04/09/2023  Check In:  Session Check In - 04/09/23 1129       Check-In   Supervising physician immediately available to respond to emergencies [facility] MD immediately available    Physician(s) Robin Searing, NP    Location [facility]    Staff Present Samantha Belarus, RD, Dutch Gray, RN, BSN;Randi Reeve BS, ACSM-CEP, Exercise Physiologist;Candiss Galeana Earlene Plater, MS, ACSM-CEP, Exercise Physiologist    Virtual Visit No    Medication changes reported     No    Fall or balance concerns reported    No    Tobacco Cessation No Change    Warm-up and Cool-down Performed as group-led instruction    Resistance Training Performed Yes    VAD Patient? No    PAD/SET Patient? No      Pain Assessment   Currently in Pain? No/denies             Capillary Blood Glucose: No results found for this or any previous visit (from the past 24 hour(s)).    Social History   Tobacco Use  Smoking Status Former   Packs/day: 1.00   Years: 25.00   Additional pack years: 0.00   Total pack years: 25.00   Types: Cigarettes   Quit date: 37   Years since quitting: 29.5   Passive exposure: Never  Smokeless Tobacco Never    Goals Met:  Proper associated with RPD/PD & O2 Sat Exercise tolerated well No report of concerns or symptoms today Strength training completed today  Goals Unmet:  Not Applicable  Comments: Service time is from 1011 to 1141.    Dr. Mechele Collin is Medical Director for Pulmonary Rehab at [facility].

## 2023-04-10 NOTE — Progress Notes (Signed)
Discharge Progress Report  Patient Details  Name: Keith [name] Date of Birth: [birth year] Referring Provider:   Doristine Devoid Pulmonary Rehab Walk Test from 01/14/2023 in [facility]  Referring Provider [facility]        Number of Visits: 44  Reason for Discharge:  Patient reached a stable level of exercise. Patient independent in their exercise. Patient has met program and personal goals.  Smoking History:  Social History   Tobacco Use  Smoking Status Former   Packs/day: 1.00   Years: 25.00   Additional pack years: 0.00   Total pack years: 25.00   Types: Cigarettes   Quit date: 69   Years since quitting: 29.5   Passive exposure: Never  Smokeless Tobacco Never    Diagnosis:  Pulmonary HTN (HCC)  ADL UCSD:  Pulmonary Assessment Scores     Row Name 01/14/23 1258 04/04/23 1508       ADL UCSD   ADL Phase -- Exit    SOB Score total 76 20      CAT Score   CAT Score 25 10      mMRC Score   mMRC Score 4 4             Initial Exercise Prescription:  Initial Exercise Prescription - 01/14/23 1200       Date of Initial Exercise RX and Referring Provider   Date 01/14/23    Referring Provider [name]    Expected Discharge Date 04/09/23      T5 Nustep   Level 1    SPM 50    Minutes 30    METs 2.1      Prescription Details   Frequency (times per week) 2    Duration Progress to 30 minutes of continuous aerobic without signs/symptoms of physical distress      Intensity   THRR 40-80% of Max Heartrate 60-120    Ratings of Perceived Exertion 11-13    Perceived Dyspnea 0-4      Progression   Progression Continue progressive overload as per policy without signs/symptoms or physical distress.      Resistance Training   Training Prescription Yes    Weight red bands    Reps 10-15             Discharge Exercise Prescription (Final Exercise Prescription Changes):  Exercise  Prescription Changes - 04/02/23 1100       Response to Exercise   Blood Pressure (Admit) 100/58    Blood Pressure (Exercise) 112/64    Blood Pressure (Exit) 100/64    Heart Rate (Admit) 96 bpm    Heart Rate (Exercise) 107 bpm    Heart Rate (Exit) 95 bpm    Oxygen Saturation (Admit) 98 %    Oxygen Saturation (Exercise) 95 %    Oxygen Saturation (Exit) 99 %    Rating of Perceived Exertion (Exercise) 10    Perceived Dyspnea (Exercise) 1    Duration Continue with 30 min of aerobic exercise without signs/symptoms of physical distress.    Intensity THRR unchanged      Progression   Progression Continue to progress workloads to maintain intensity without signs/symptoms of physical distress.      Resistance Training   Training Prescription Yes    Weight blue bands    Reps 10-15    Time 10 Minutes      T5 Nustep   Level 4    Minutes 15  METs 2             Functional Capacity:  6 Minute Walk     Row Name 01/14/23 1207 04/04/23 1526       6 Minute Walk   Phase Initial  Performed on NuStep Discharge  Post Nustep test    Distance 1049.9 feet 2112 feet    Walk Time 6 minutes 6 minutes    # of Rest Breaks 0 0    MPH 1.99 4    METS 2.1 3.65    RPE 9 11    Perceived Dyspnea  1 1    VO2 Peak 7.36 12.79    Symptoms -- No    Resting HR 94 bpm 112 bpm    Resting BP 122/62 100/50    Resting Oxygen Saturation  96 % 97 %    Exercise Oxygen Saturation  during 6 min walk 96 % 95 %    Max Ex. HR 115 bpm 110 bpm    Max Ex. BP 134/62 114/60    2 Minute Post BP 120/60 110/60      Interval HR   1 Minute HR 105 108    2 Minute HR 107 110    3 Minute HR 109 109    4 Minute HR 111 110    5 Minute HR 106 109    6 Minute HR 115 105    2 Minute Post HR 98 111    Interval Heart Rate? Yes Yes      Interval Oxygen   Interval Oxygen? Yes Yes    Baseline Oxygen Saturation % 96 % 97 %    1 Minute Oxygen Saturation % 97 % 97 %    1 Minute Liters of Oxygen 0 L 0 L    2 Minute  Oxygen Saturation % 98 % 95 %    2 Minute Liters of Oxygen 0 L 0 L    3 Minute Oxygen Saturation % 98 % 95 %    3 Minute Liters of Oxygen 0 L 0 L    4 Minute Oxygen Saturation % 98 % 95 %    4 Minute Liters of Oxygen 0 L 0 L    5 Minute Oxygen Saturation % 99 % 95 %    5 Minute Liters of Oxygen 0 L 0 L    6 Minute Oxygen Saturation % 97 % 96 %    6 Minute Liters of Oxygen 0 L 0 L    2 Minute Post Oxygen Saturation % 97 % 99 %    2 Minute Post Liters of Oxygen 0 L 0 L             Psychological, QOL, Others - Outcomes: PHQ 2/9:    04/04/2023    3:08 PM 03/20/2023    1:56 PM 01/14/2023   12:45 PM 11/02/2022    1:54 PM 06/06/2022   11:02 AM  Depression screen PHQ 2/9  Decreased Interest 0 0 0 0 0  Down, Depressed, Hopeless 0 0 0 0 0  PHQ - 2 Score 0 0 0 0 0  Altered sleeping 0  1    Tired, decreased energy 0  1    Change in appetite 0  0    Feeling bad or failure about yourself  0  0    Trouble concentrating 0  0    Moving slowly or fidgety/restless 0  0    Suicidal thoughts 0  0  PHQ-9 Score 0  2    Difficult doing work/chores Not difficult at all  Not difficult at all      Quality of Life:   Personal Goals: Goals established at orientation with interventions provided to work toward goal.  Personal Goals and Risk Factors at Admission - 01/14/23 1100       Core Components/Risk Factors/Patient Goals on Admission    Weight Management Weight Loss    Improve shortness of breath with ADL's Yes    Intervention Provide education, individualized exercise plan and daily activity instruction to help decrease symptoms of SOB with activities of daily living.    Expected Outcomes Short Term: Improve cardiorespiratory fitness to achieve a reduction of symptoms when performing ADLs              Personal Goals Discharge:  Goals and Risk Factor Review     Row Name 01/14/23 1250 02/11/23 1044 02/11/23 1046 03/08/23 1405       Core Components/Risk Factors/Patient Goals  Review   Personal Goals Review Weight Management/Obesity;Improve shortness of breath with ADL's;Develop more efficient breathing techniques such as purse lipped breathing and diaphragmatic breathing and practicing self-pacing with activity. -- Weight Management/Obesity;Improve shortness of breath with ADL's;Develop more efficient breathing techniques such as purse lipped breathing and diaphragmatic breathing and practicing self-pacing with activity. Weight Management/Obesity;Improve shortness of breath with ADL's    Review Unable to assess goals as patient has not started the program yet. He is scheduled to start on 01/22/2023 -- Keith [name] is working towards his goal of weight loss. He is currently working with our dietician. He has not made any progress towards his goal. Keith [name] is also working on improving his shortness of breath with his ADLs. Keith [name] stated that he is slowly building stamina and can see a difference when he bathes. Keith [name] can initiate purse lipped & diaphragmatic breathing when he becomes short of breath. He can also self-pace with activity when he becomes short of breath. These goals have been met by the patient. Keith [name] has lost weight since he started the program. Goal in progress (see dietician note). He is working on improving his shortness of breath with ADLs to get back to where he previously was. Goal in progress. His oxygen saturation has been stable on RA while exercising. Keith [name] loves participating in Keith [name] and looks forward to coming.    Expected Outcomes See Admission goals -- See Admission goals See Admission goals             Exercise Goals and Review:  Exercise Goals     Row Name 01/14/23 1101 01/15/23 0840 02/14/23 1558 03/11/23 1156       Exercise Goals   Increase Physical Activity Yes Yes Yes Yes    Intervention Provide advice, education, support and counseling about physical activity/exercise needs.;Develop an individualized exercise prescription for aerobic  and resistive training based on initial evaluation findings, risk stratification, comorbidities and participant's personal goals. Provide advice, education, support and counseling about physical activity/exercise needs.;Develop an individualized exercise prescription for aerobic and resistive training based on initial evaluation findings, risk stratification, comorbidities and participant's personal goals. Provide advice, education, support and counseling about physical activity/exercise needs.;Develop an individualized exercise prescription for aerobic and resistive training based on initial evaluation findings, risk stratification, comorbidities and participant's personal goals. Provide advice, education, support and counseling about physical activity/exercise needs.;Develop an individualized exercise prescription for aerobic and resistive training based on initial evaluation findings, risk stratification, comorbidities and participant's personal goals.  Expected Outcomes Short Term: Attend rehab on a regular basis to increase amount of physical activity.;Long Term: Exercising regularly at least 3-5 days a week.;Long Term: Add in home exercise to make exercise part of routine and to increase amount of physical activity. Short Term: Attend rehab on a regular basis to increase amount of physical activity.;Long Term: Exercising regularly at least 3-5 days a week.;Long Term: Add in home exercise to make exercise part of routine and to increase amount of physical activity. Short Term: Attend rehab on a regular basis to increase amount of physical activity.;Long Term: Exercising regularly at least 3-5 days a week.;Long Term: Add in home exercise to make exercise part of routine and to increase amount of physical activity. Short Term: Attend rehab on a regular basis to increase amount of physical activity.;Long Term: Exercising regularly at least 3-5 days a week.;Long Term: Add in home exercise to make exercise part  of routine and to increase amount of physical activity.    Increase Strength and Stamina Yes Yes Yes Yes    Intervention Provide advice, education, support and counseling about physical activity/exercise needs.;Develop an individualized exercise prescription for aerobic and resistive training based on initial evaluation findings, risk stratification, comorbidities and participant's personal goals. Provide advice, education, support and counseling about physical activity/exercise needs.;Develop an individualized exercise prescription for aerobic and resistive training based on initial evaluation findings, risk stratification, comorbidities and participant's personal goals. Provide advice, education, support and counseling about physical activity/exercise needs.;Develop an individualized exercise prescription for aerobic and resistive training based on initial evaluation findings, risk stratification, comorbidities and participant's personal goals. Provide advice, education, support and counseling about physical activity/exercise needs.;Develop an individualized exercise prescription for aerobic and resistive training based on initial evaluation findings, risk stratification, comorbidities and participant's personal goals.    Expected Outcomes Short Term: Increase workloads from initial exercise prescription for resistance, speed, and METs.;Short Term: Perform resistance training exercises routinely during rehab and add in resistance training at home;Long Term: Improve cardiorespiratory fitness, muscular endurance and strength as measured by increased METs and functional capacity ( ) Short Term: Increase workloads from initial exercise prescription for resistance, speed, and METs.;Short Term: Perform resistance training exercises routinely during rehab and add in resistance training at home;Long Term: Improve cardiorespiratory fitness, muscular endurance and strength as measured by increased METs and functional  capacity ( ) Short Term: Increase workloads from initial exercise prescription for resistance, speed, and METs.;Short Term: Perform resistance training exercises routinely during rehab and add in resistance training at home;Long Term: Improve cardiorespiratory fitness, muscular endurance and strength as measured by increased METs and functional capacity ( ) Short Term: Increase workloads from initial exercise prescription for resistance, speed, and METs.;Short Term: Perform resistance training exercises routinely during rehab and add in resistance training at home;Long Term: Improve cardiorespiratory fitness, muscular endurance and strength as measured by increased METs and functional capacity ( )    Able to understand and use rate of perceived exertion (RPE) scale Yes Yes Yes Yes    Intervention Provide education and explanation on how to use RPE scale Provide education and explanation on how to use RPE scale Provide education and explanation on how to use RPE scale Provide education and explanation on how to use RPE scale    Expected Outcomes Short Term: Able to use RPE daily in rehab to express subjective intensity level;Long Term:  Able to use RPE to guide intensity level when exercising independently Short Term: Able to use RPE daily in rehab to express subjective intensity level;Long  Term:  Able to use RPE to guide intensity level when exercising independently Short Term: Able to use RPE daily in rehab to express subjective intensity level;Long Term:  Able to use RPE to guide intensity level when exercising independently Short Term: Able to use RPE daily in rehab to express subjective intensity level;Long Term:  Able to use RPE to guide intensity level when exercising independently    Able to understand and use Dyspnea scale Yes Yes Yes Yes    Intervention Provide education and explanation on how to use Dyspnea scale Provide education and explanation on how to use Dyspnea scale Provide education  and explanation on how to use Dyspnea scale Provide education and explanation on how to use Dyspnea scale    Expected Outcomes Short Term: Able to use Dyspnea scale daily in rehab to express subjective sense of shortness of breath during exertion;Long Term: Able to use Dyspnea scale to guide intensity level when exercising independently Short Term: Able to use Dyspnea scale daily in rehab to express subjective sense of shortness of breath during exertion;Long Term: Able to use Dyspnea scale to guide intensity level when exercising independently Short Term: Able to use Dyspnea scale daily in rehab to express subjective sense of shortness of breath during exertion;Long Term: Able to use Dyspnea scale to guide intensity level when exercising independently Short Term: Able to use Dyspnea scale daily in rehab to express subjective sense of shortness of breath during exertion;Long Term: Able to use Dyspnea scale to guide intensity level when exercising independently    Knowledge and understanding of Target Heart Rate Range (THRR) Yes Yes Yes Yes    Intervention Provide education and explanation of THRR including how the numbers were predicted and where they are located for reference Provide education and explanation of THRR including how the numbers were predicted and where they are located for reference Provide education and explanation of THRR including how the numbers were predicted and where they are located for reference Provide education and explanation of THRR including how the numbers were predicted and where they are located for reference    Expected Outcomes Short Term: Able to state/look up THRR;Short Term: Able to use daily as guideline for intensity in rehab;Long Term: Able to use THRR to govern intensity when exercising independently Short Term: Able to state/look up THRR;Short Term: Able to use daily as guideline for intensity in rehab;Long Term: Able to use THRR to govern intensity when exercising  independently Short Term: Able to state/look up THRR;Short Term: Able to use daily as guideline for intensity in rehab;Long Term: Able to use THRR to govern intensity when exercising independently Short Term: Able to state/look up THRR;Short Term: Able to use daily as guideline for intensity in rehab;Long Term: Able to use THRR to govern intensity when exercising independently    Understanding of Exercise Prescription Yes Yes Yes Yes    Intervention Provide education, explanation, and written materials on patient's individual exercise prescription Provide education, explanation, and written materials on patient's individual exercise prescription Provide education, explanation, and written materials on patient's individual exercise prescription Provide education, explanation, and written materials on patient's individual exercise prescription    Expected Outcomes Short Term: Able to explain program exercise prescription;Long Term: Able to explain home exercise prescription to exercise independently Short Term: Able to explain program exercise prescription;Long Term: Able to explain home exercise prescription to exercise independently Short Term: Able to explain program exercise prescription;Long Term: Able to explain home exercise prescription to exercise independently Short  Term: Able to explain program exercise prescription;Long Term: Able to explain home exercise prescription to exercise independently             Exercise Goals Re-Evaluation:  Exercise Goals Re-Evaluation     Row Name 01/15/23 0840 02/14/23 1600 03/11/23 1157         Exercise Goal Re-Evaluation   Exercise Goals Review Increase Physical Activity;Able to understand and use Dyspnea scale;Understanding of Exercise Prescription;Increase Strength and Stamina;Knowledge and understanding of Target Heart Rate Range (THRR);Able to understand and use rate of perceived exertion (RPE) scale Increase Physical Activity;Able to understand and  use Dyspnea scale;Understanding of Exercise Prescription;Increase Strength and Stamina;Knowledge and understanding of Target Heart Rate Range (THRR);Able to understand and use rate of perceived exertion (RPE) scale Increase Physical Activity;Able to understand and use Dyspnea scale;Understanding of Exercise Prescription;Increase Strength and Stamina;Knowledge and understanding of Target Heart Rate Range (THRR);Able to understand and use rate of perceived exertion (RPE) scale     Comments Pt is scheduled to begin exercise this week. Will continue to monitor and progress as able. Keith [name] has completed 8 exercise sessions. He exercises for 30 min on the Nustep. Keith [name] averages 2 METs at level 2 on the Nustep. He perform the warmup and cooldown seated in his wheelchair. Keith [name] does an alternative warmup and cooldown due to his limits. He is postive and seems to enjoy the program. Keith [name] is deconditioned as his progression will be limited to increasing his workload on the Nustep. Will continue to monitor and progress as able. Keith [name] has completed  exercise sessions. He exercises for 30 min on the Nustep. Keith [name] averages 2 METs at level 3 on the Nustep. He perform the warmup and cooldown seated in his wheelchair. Keith [name] does an alternative warmup and cooldown due to his limits. Keith [name] has increased his workload for the Nustep despite METs remaining the same. He remains positive even though his is very limited. Will continue to monitor and progress as able.     Expected Outcomes Through exercise at rehab and home, the patient will decrease shortness of breath with daily activities and feel confident in carrying out an exercise regimen at home. Through exercise at rehab and home, the patient will decrease shortness of breath with daily activities and feel confident in carrying out an exercise regimen at home. Through exercise at rehab and home, the patient will decrease shortness of breath with daily activities and feel  confident in carrying out an exercise regimen at home.              Nutrition & Weight - Outcomes:    Nutrition:  Nutrition Therapy & Goals - 03/19/23 1029       Nutrition Therapy   Diet Heart healthy/Carbohydrate Consistent Diet    Drug/Food Interactions Statins/Certain Fruits      Personal Nutrition Goals   Nutrition Goal Patient to improve diet quality by using the plate method as a guide for meal planning to include lean protein/plant protein, fruits, vegetables, whole grains, nonfat dairy as part of a well-balanced diet.    Personal Goal #2 Patient to identify strategies for weight loss of 0.5-2.0# per week.    Personal Goal #3 Patient to limit sodium 2000mg  per day.    Comments Goals in action. Keith [name] has started making many dietary changes including reduced sodium intake, reduced refined carbohydrates/simple sugars. Reviewed strategies for weight loss including benefits of high fiber/high protein intake and reducing calories from carbohydrates and fat. He countinues mounjaro to aid with  weight loss; he is down 4.2# since starting with our program. He does self report weights from home. Keith [name] will benefit from participation in pulmonary rehab for nutrition, exercise, and lifestyle modification.      Intervention Plan   Intervention Prescribe, educate and counsel regarding individualized specific dietary modifications aiming towards targeted core components such as weight, hypertension, lipid management, diabetes, heart failure and other comorbidities.;Nutrition handout(s) given to patient.    Expected Outcomes Short Term Goal: Understand basic principles of dietary content, such as calories, fat, sodium, cholesterol and nutrients.;Long Term Goal: Adherence to prescribed nutrition plan.             Nutrition Discharge:   Education Questionnaire Score:  Knowledge Questionnaire Score - 04/04/23 1508       Knowledge Questionnaire Score   Post Score 16/18              Goals reviewed with patient; copy given to patient.

## 2023-04-17 ENCOUNTER — Other Ambulatory Visit (HOSPITAL_COMMUNITY): Payer: Self-pay

## 2023-04-17 ENCOUNTER — Telehealth (HOSPITAL_COMMUNITY): Payer: Self-pay | Admitting: *Deleted

## 2023-04-17 MED ORDER — HYDROCODONE-ACETAMINOPHEN 7.5-325 MG PO TABS
1.0000 | ORAL_TABLET | Freq: Four times a day (QID) | ORAL | 0 refills | Status: DC | PRN
  Filled 2023-04-17: qty 120, 30d supply, fill #0

## 2023-04-17 MED ORDER — GABAPENTIN 600 MG PO TABS
600.0000 mg | ORAL_TABLET | Freq: Three times a day (TID) | ORAL | 0 refills | Status: DC
  Filled 2023-04-17: qty 90, 30d supply, fill #0

## 2023-04-18 ENCOUNTER — Other Ambulatory Visit (HOSPITAL_COMMUNITY): Payer: Self-pay

## 2023-04-18 MED ORDER — HYDROCODONE-ACETAMINOPHEN 7.5-325 MG PO TABS
1.0000 | ORAL_TABLET | Freq: Four times a day (QID) | ORAL | 0 refills | Status: DC
  Filled 2023-04-18: qty 120, 30d supply, fill #0

## 2023-04-24 ENCOUNTER — Other Ambulatory Visit (HOSPITAL_COMMUNITY): Payer: Medicare Other

## 2023-04-24 ENCOUNTER — Encounter (HOSPITAL_COMMUNITY): Payer: Medicare Other

## 2023-04-30 ENCOUNTER — Encounter (HOSPITAL_COMMUNITY)
Admission: RE | Admit: 2023-04-30 | Discharge: 2023-04-30 | Disposition: A | Discharge status: Home / Self Care | Payer: Medicare Other | Source: Ambulatory Visit | Attending: Cardiology | Admitting: Cardiology

## 2023-04-30 ENCOUNTER — Ambulatory Visit (HOSPITAL_COMMUNITY)
Admission: RE | Admit: 2023-04-30 | Discharge: 2023-04-30 | Disposition: A | Discharge status: Home / Self Care | Payer: Medicare Other | Source: Ambulatory Visit | Attending: Cardiology | Admitting: Cardiology

## 2023-04-30 DIAGNOSIS — I5032 Chronic diastolic (congestive) heart failure: Secondary | ICD-10-CM | POA: Insufficient documentation

## 2023-04-30 MED ORDER — TECHNETIUM TO 99M ALBUMIN AGGREGATED
4.0000 | Freq: Once | INTRAVENOUS | Status: AC | PRN
  Administered 2023-04-30: 4 via INTRAVENOUS

## 2023-05-03 ENCOUNTER — Encounter (HOSPITAL_COMMUNITY): Payer: Self-pay | Admitting: Cardiology

## 2023-05-03 ENCOUNTER — Telehealth (HOSPITAL_COMMUNITY): Payer: Self-pay | Admitting: Pharmacy Technician

## 2023-05-03 ENCOUNTER — Ambulatory Visit (HOSPITAL_COMMUNITY)
Admission: RE | Admit: 2023-05-03 | Discharge: 2023-05-03 | Disposition: A | Discharge status: Home / Self Care | Payer: Medicare Other | Source: Ambulatory Visit | Attending: Cardiology | Admitting: Cardiology

## 2023-05-03 ENCOUNTER — Other Ambulatory Visit (HOSPITAL_COMMUNITY): Payer: Self-pay

## 2023-05-03 VITALS — BP 110/58 | HR 113

## 2023-05-03 DIAGNOSIS — N183 Chronic kidney disease, stage 3 unspecified: Secondary | ICD-10-CM | POA: Diagnosis not present

## 2023-05-03 DIAGNOSIS — I272 Pulmonary hypertension, unspecified: Secondary | ICD-10-CM

## 2023-05-03 DIAGNOSIS — Z7901 Long term (current) use of anticoagulants: Secondary | ICD-10-CM | POA: Insufficient documentation

## 2023-05-03 DIAGNOSIS — Z86018 Personal history of other benign neoplasm: Secondary | ICD-10-CM | POA: Insufficient documentation

## 2023-05-03 DIAGNOSIS — Z86718 Personal history of other venous thrombosis and embolism: Secondary | ICD-10-CM | POA: Diagnosis not present

## 2023-05-03 DIAGNOSIS — Z79899 Other long term (current) drug therapy: Secondary | ICD-10-CM | POA: Diagnosis not present

## 2023-05-03 DIAGNOSIS — G4733 Obstructive sleep apnea (adult) (pediatric): Secondary | ICD-10-CM | POA: Insufficient documentation

## 2023-05-03 DIAGNOSIS — I130 Hypertensive heart and chronic kidney disease with heart failure and stage 1 through stage 4 chronic kidney disease, or unspecified chronic kidney disease: Secondary | ICD-10-CM | POA: Insufficient documentation

## 2023-05-03 DIAGNOSIS — E669 Obesity, unspecified: Secondary | ICD-10-CM | POA: Insufficient documentation

## 2023-05-03 DIAGNOSIS — Z8249 Family history of ischemic heart disease and other diseases of the circulatory system: Secondary | ICD-10-CM | POA: Insufficient documentation

## 2023-05-03 DIAGNOSIS — E1122 Type 2 diabetes mellitus with diabetic chronic kidney disease: Secondary | ICD-10-CM | POA: Insufficient documentation

## 2023-05-03 DIAGNOSIS — Z86711 Personal history of pulmonary embolism: Secondary | ICD-10-CM | POA: Diagnosis not present

## 2023-05-03 DIAGNOSIS — I2721 Secondary pulmonary arterial hypertension: Secondary | ICD-10-CM | POA: Diagnosis not present

## 2023-05-03 DIAGNOSIS — I071 Rheumatic tricuspid insufficiency: Secondary | ICD-10-CM | POA: Diagnosis not present

## 2023-05-03 DIAGNOSIS — Z87891 Personal history of nicotine dependence: Secondary | ICD-10-CM | POA: Diagnosis not present

## 2023-05-03 DIAGNOSIS — I444 Left anterior fascicular block: Secondary | ICD-10-CM | POA: Insufficient documentation

## 2023-05-03 DIAGNOSIS — M4800 Spinal stenosis, site unspecified: Secondary | ICD-10-CM | POA: Diagnosis not present

## 2023-05-03 DIAGNOSIS — I5032 Chronic diastolic (congestive) heart failure: Secondary | ICD-10-CM

## 2023-05-03 LAB — BASIC METABOLIC PANEL
Anion gap: 13 (ref 5–15)
BUN: 23 mg/dL (ref 8–23)
CO2: 22 mmol/L (ref 22–32)
Calcium: 10.2 mg/dL (ref 8.9–10.3)
Chloride: 106 mmol/L (ref 98–111)
Creatinine, Ser: 1.33 mg/dL — ABNORMAL HIGH (ref 0.61–1.24)
GFR, Estimated: 58 mL/min — ABNORMAL LOW (ref >60)
Glucose, Bld: 96 mg/dL (ref 70–99)
Potassium: 3.7 mmol/L (ref 3.5–5.1)
Sodium: 141 mmol/L (ref 135–145)

## 2023-05-03 LAB — CBC
HCT: 35 % — ABNORMAL LOW (ref 39.0–52.0)
Hemoglobin: 10.8 g/dL — ABNORMAL LOW (ref 13.0–17.0)
MCH: 28.6 pg (ref 26.0–34.0)
MCHC: 30.9 g/dL (ref 30.0–36.0)
MCV: 92.8 fL (ref 80.0–100.0)
Platelets: 403 10*3/uL — ABNORMAL HIGH (ref 150–400)
RBC: 3.77 MIL/uL — ABNORMAL LOW (ref 4.22–5.81)
RDW: 15.6 % — ABNORMAL HIGH (ref 11.5–15.5)
WBC: 9.2 10*3/uL (ref 4.0–10.5)
nRBC: 0 % (ref 0.0–0.2)

## 2023-05-03 LAB — LIPID PANEL
Cholesterol: 85 mg/dL (ref 0–200)
HDL: 40 mg/dL — ABNORMAL LOW (ref >40)
LDL Cholesterol: 29 mg/dL (ref 0–99)
Total CHOL/HDL Ratio: 2.1 RATIO
Triglycerides: 78 mg/dL (ref <150)
VLDL: 16 mg/dL (ref 0–40)

## 2023-05-03 LAB — BRAIN NATRIURETIC PEPTIDE: B Natriuretic Peptide: 26.7 pg/mL (ref 0.0–100.0)

## 2023-05-03 NOTE — Telephone Encounter (Signed)
Patient Advocate Encounter   Received notification from [employer] that prior authorization for [employer] is required.   PA submitted on CoverMyMeds Key [id number] Status is pending   Will continue to follow.

## 2023-05-03 NOTE — Progress Notes (Signed)
ADVANCED HF CLINIC CONSULT NOTE  Primary Care: [place], [name], MD HF Cardiologist: Dr. [name]   HPI: Keith [name] is a 72 y.o.male with history saddle PE/LLE DVT 09/21 s/p TPA complicated by hypotension requiring Levophed, multiple prior back surgeries, left adrenal adenoma, DM II, obesity, HTN.   Admitted 03/01/22 with CP. He was hypoxic requiring supplemental oxygen. V/Q scan with moderate to high probability of PE.  Lifelong DOAC recommended, started on Eliquis 5 BID (had been off eliquis since 02/22).   Echo in 5/23 showed EF 55-60%, mild LVH, interventricular septum flattened in systole and diastole consistent with RV pressure and volume overload, RV severely enlarged with severely reduced function, RVSP 75 mmH, moderate to severe TR.   RHC/LHC in 5/23 showed no CAD, elevated right and left heart filling pressures, and pulmonary venous hypertension.  He was seen by CT surgery. Felt to be high risk for pulmonary embolectomy. Course complicated by AKI, Scr peaked at 3.34 and improved to 1.60 (baseline). Started on torsemide 20 mg M, W, F at discharge. Atenolol/HTCZ stopped.   Echo in 9/23 showed EF 55-60%, mildly decreased RV function with mild-moderate RV enlargement, unable to estimate PA systolic pressure, IVC normal. RHC was done in 10/23, showing moderate pulmonary arterial hypertension with PVR 5 WU.    V/Q scan in 7/24 showed right lung findings suggestive of chronic PE.   Patient returns for followup of RV failure.  He walks very little due to spinal stenosis, not CHF.  He is primarily in the wheelchair. He walks short distances with his walker at home.  He is now using CPAP.  Weight is coming down a lot with Mounjaro, but he feels like he is also losing muscle mass.  No lightheadedness.  No chest pain.  He has moderate dyspnea with bathing and dressing. He was unable to take Adempas 2.5 tid but can tolerate 2 mg tid.   Labs (6/23): K 4.0, creatinine 1.28 Labs (7/23): K 4.1,  creatinine 1.69, BNP 20 Labs (9/23): K 4.2, creatinine 1.42, BNP 33 Labs (10/23): RF elevated, ANA elevated, K 4.6, creatinine 1.44, BNP 16 Labs (11/23): creatinine 1.5 Labs (1/24): K 3.8, creatinine 1.03 Labs (3/24): BNP 17, K 4.1, creatinine 1.36  ECG (personally reviewed): ST, LAFB, poor RWP  PMH: 1. Type 2 diabetes 2. Left adrenal adenoma 3. Obesity 4. HTN 5. H/o back surgery for spinal stenosis: Has had 3 surgeries, actually could not walk after the third surgery but has been doing PT.  6. Venous thromboembolism: Saddle PE in 9/21 with LLE DVT, this was treated with TPA.   - Recurrent PE in 5/23 after stopping DOAC.  - Echo (5/23): EF 55-60%, mild LVH, interventricular septum flattened in systole and diastole consistent with RV pressure and volume overload, RV severely enlarged with severely reduced function, RVSP 75 mmH, moderate to severe TR.  - R/LHC (5/23): RA 12, PA 48/34 (39), PCWP mean 40, V wave 42, Fick CO/CI 5.93/2.33, PVR ?< 1 WU.  - Echo (9/23): EF 55-60%, mildly decreased RV function with mild-moderate RV enlargement, unable to estimate PA systolic pressure, IVC normal.  - RHC (10/23): mean RA 9, PA 60/30 mean 36, mean PCWP 6, CI 2.46, PVR 5 WU - CTA chest (1/24): No PE - ANA/anti-cardiolipid/beta-2 glycoprotein/CCP negative, ESR 50, RF 42 (2/24) - V/Q scan (7/24): right lung findings suggestive of chronic PE.  7. CKD stage 3 8. OSA: Uses CPAP.   Review of Systems: All systems reviewed and negative except as  per HPI.    Current Outpatient Medications  Medication Sig Dispense Refill   acetaminophen (TYLENOL) 325 MG tablet Take 1-2 tablets (325-650 mg total) by mouth every 4 (four) hours as needed for mild pain.     amLODipine (NORVASC) 10 MG tablet TAKE 1 TABLET BY MOUTH DAILY 100 tablet 2   Blood Glucose Monitoring Suppl (ACCU-CHEK GUIDE ME) w/Device KIT USE TO TEST BLOOD SUGARS 1-2 TIMES DAILY. 1 kit 0   carvedilol (COREG) 12.5 MG tablet Take 1 tablet (12.5 mg  total) by mouth 2 (two) times daily with a meal. 180 tablet 3   DULoxetine (CYMBALTA) 60 MG capsule TAKE 1 CAPSULE BY MOUTH EVERY DAY 90 capsule 0   ELIQUIS 5 MG TABS tablet TAKE 1 TABLET BY MOUTH TWICE A DAY 60 tablet 11   ferrous sulfate 325 (65 FE) MG tablet TAKE 1 TABLET BY MOUTH EVERY DAY WITH BREAKFAST 90 tablet 1   gabapentin (NEURONTIN) 600 MG tablet Take 600 mg by mouth 3 (three) times daily.     HYDROcodone-acetaminophen (NORCO) 7.5-325 MG tablet Take 1 tablet by mouth in the morning, at noon, in the evening, and at bedtime. 7.5 mg/375 mg     hydrocortisone (ANUSOL-HC) 2.5 % rectal cream Place 1 application. rectally as needed for hemorrhoids or anal itching.     levETIRAcetam (KEPPRA) 250 MG tablet Take 1 tablet (250 mg total) by mouth 2 (two) times daily. 60 tablet 1   lidocaine (LIDODERM) 5 % Place 1 patch onto the skin daily as needed (pain). Remove & Discard patch within 12 hours or as directed by MD     naloxone (NARCAN) nasal spray 4 mg/0.1 mL Place 1 spray into the nose as needed (accidental overdose).     omeprazole (PRILOSEC) 40 MG capsule TAKE 1 CAPSULE BY MOUTH DAILY 100 capsule 2   polyethylene glycol (MIRALAX / GLYCOLAX) 17 g packet Take 17 g by mouth daily as needed for mild constipation.     Riociguat (ADEMPAS) 2 MG TABS Take 2 mg by mouth in the morning, at noon, and at bedtime. 90 tablet 11   rOPINIRole (REQUIP) 1 MG tablet Take 1 tablet (1 mg total) by mouth at bedtime. 90 tablet 2   rosuvastatin (CRESTOR) 20 MG tablet Take 1 tablet (20 mg total) by mouth daily. 90 tablet 3   spironolactone (ALDACTONE) 25 MG tablet Take 1 tablet (25 mg total) by mouth daily. 90 tablet 3   tirzepatide (MOUNJARO) 5 MG/0.5ML Pen Inject 5 mg into the skin once a week. 2 mL 11   torsemide (DEMADEX) 20 MG tablet TAKE 1 AND 1/2 TABLETS DAILY BY MOUTH 135 tablet 2   traZODone (DESYREL) 50 MG tablet Take 0.5-1 tablets (25-50 mg total) by mouth at bedtime as needed. for sleep 90 tablet 0   No  current facility-administered medications for this encounter.   Allergies  Allergen Reactions   Aspirin Other (See Comments)    Irritates the stomach and the patient developed ulcers, also   Lisinopril Other (See Comments)    Caused a body ache   Social History   Socioeconomic History   Marital status: Married    Spouse name: Wynona Canes   Number of children: 3   Years of education: 4 years college   Highest education level: Bachelor's degree (e.g., BA, AB, BS)  Occupational History   Occupation: retired  Tobacco Use   Smoking status: Former    Current packs/day: 0.00    Average packs/day: 1 pack/day for  25.0 years (25.0 ttl pk-yrs)    Types: Cigarettes    Start date: 33    Quit date: 67    Years since quitting: 29.5    Passive exposure: Never   Smokeless tobacco: Never  Vaping Use   Vaping status: Never Used  Substance and Sexual Activity   Alcohol use: Yes    Comment: holidays and special occasions   Drug use: Not Currently   Sexual activity: Yes    Birth control/protection: None  Other Topics Concern   Not on file  Social History Narrative   Married, 5 kids, 14 grand, 2 great grand, watch TV, Armed forces operational officer, traveling   Social Determinants of Health   Financial Resource Strain: Low Risk  (03/16/2023)   Overall Financial Resource Strain (CARDIA)    Difficulty of Paying Living Expenses: Not hard at all  Food Insecurity: No Food Insecurity (03/16/2023)   Hunger Vital Sign    Worried About Running Out of Food in the Last Year: Never true    Ran Out of Food in the Last Year: Never true  Transportation Needs: No Transportation Needs (03/16/2023)   PRAPARE - Administrator, Civil Service (Medical): No    Lack of Transportation (Non-Medical): No  Physical Activity: Insufficiently Active (03/16/2023)   Exercise Vital Sign    Days of Exercise per Week: 3 days    Minutes of Exercise per Session: 30 min  Stress: No Stress Concern Present (03/16/2023)    [employer] of Occupational Health - Occupational Stress Questionnaire    Feeling of Stress : Not at all  Social Connections: Moderately Integrated (03/16/2023)   Social Connection and Isolation Panel [NHANES]    Frequency of Communication with Friends and Family: More than three times a week    Frequency of Social Gatherings with Friends and Family: Twice a week    Attends Religious Services: More than 4 times per year    Active Member of [employer] or Organizations: No    Attends Engineer, structural: Not on file    Marital Status: Married  Catering manager Violence: Not At Risk (10/23/2022)   Humiliation, Afraid, Rape, and Kick questionnaire    Fear of Current or Ex-Partner: No    Emotionally Abused: No    Physically Abused: No    Sexually Abused: No   Family History  Problem Relation Age of Onset   Diabetes Mother    Asthma Mother    Breast cancer Mother    Colon polyps Mother    Hypertension Father    Stroke Father    Heart failure Brother    Stroke Brother    Ovarian cancer Maternal Grandmother    Hypertension Maternal Grandfather    Heart attack Maternal Grandfather    Colon cancer Neg Hx    BP (!) 110/58   Pulse (!) 113   SpO2 97%   Wt Readings from Last 3 Encounters:  04/03/23 120 kg (264 lb 8 oz)  04/02/23 120.4 kg (265 lb 6.9 oz)  03/19/23 120.8 kg (266 lb 5.1 oz)   PHYSICAL EXAM: General: NAD Neck: No JVD, no thyromegaly or thyroid nodule.  Lungs: Mild crackles at bases.  CV: Nondisplaced PMI.  Heart regular S1/S2, no S3/S4, no murmur.  1+ edema 1/2 to knee on left, 1+ ankle edema on right. No carotid bruit.  Normal pedal pulses.  Abdomen: Soft, nontender, no hepatosplenomegaly, no distention.  Skin: Intact without lesions or rashes.  Neurologic: Alert and oriented x  3.  Psych: Normal affect. Extremities: No clubbing or cyanosis.  HEENT: Normal.   ASSESSMENT & PLAN: 1.  Chronic diastolic CHF with RV failure:  Suspect RV strain from prior PEs.   Echo in 5/23 showed EF 55-60%, RV severely reduced, RVSP 75 mmHg, moderate to severe TR.  Echo in 9/23 showed EF 55-60%, mildly decreased RV function with mild-moderate RV enlargement, unable to estimate PA systolic pressure, IVC normal.  RHC in 10/23 showed moderate pulmonary arterial hypertension.  He does not looks volume overloaded today. Functional status is complicated by poor mobility due to spinal stenosis.  - Continue torsemide 30 mg daily, BMET/BNP today.   - Continue spironolactone 25 mg daily.  - Hold off on SGLT2i with recurrent UTIs.  2. Pulmonary hypertension: RHC in 10/23 showed moderate pulmonary arterial hypertension.  Based on history, this seems most likely to be chronic thromboembolic pulmonary hypertension.  He was found to have elevations in RF and ESR but negative CCP antibody.  Workup by rheumatology did not show signs of active inflammatory disease on exam. V/Q scan was finally done in 7/24, this was consistent with chronic pulmonary emboli in the right lung.  - Continue Eliquis for h/o PEs.  - I am going to refer him to [facility] for consideration of balloon pulmonary angioplasty (would not be good candidate for pulmonary thromboendarterectomy).  - Continue CPAP for OSA.  - With suspected CTEPH, he has been on Adempas, only able to get to 2 mg tid.  - I will add Opsumit 10 mg daily.  - I will arrange for repeat echo.  - Check BNP.   3. Tricuspid regurgitation: Moderate to severe on echo 05/23. Minimal on echo in 9/23.  4. History PE/DVT: PE/DVT 09/21 s/p TPA, required pressor support with NE. Recurrent PE in 5/23 off Eliquis. Now concern for chronic thromboembolic disease as above.  - Will need lifelong anticoagulation, on Eliquis 5 bid. CBC today.  - He completed pulmonary rehab.  5. CKD stage 3: BMET today.  6. HTN: BP controlled.  7. Spinal stenosis: Limited mobility at baseline.  8. OSA: Continue CPAP.  9. Obesity: He is on Mounjaro with significant weight loss.  He is  worried about muscle loss, wants to cut back on dose.  He will discuss with pharmacy clinic.   Followup in 3 months.   Marca Ancona 05/03/2023

## 2023-05-03 NOTE — Telephone Encounter (Addendum)
Advanced Heart Failure Patient Advocate Encounter  Sent in Opsumit referral with PA approval to [facility] via fax. [facility] should be dispensing after free trial is sent to patient. Document scanned to chart.   Will follow up.

## 2023-05-03 NOTE — Telephone Encounter (Signed)
Advanced Heart Failure Patient Advocate Encounter  Prior Authorization for Opsumit has been approved.    PA# [id number] Effective dates: 05/03/23 through 10/08/23  Patients co-pay is $0 (patient currently in catastrophic coverage)  Will send in referral paperwork.  Archer Asa, CPhT

## 2023-05-03 NOTE — Patient Instructions (Signed)
START Opsumit 10 mg dailyMarisue Ivan will contact you about medication )  You have been referred to [facility] Pulmonary hypertension program, They will call to schedule an appointment   Your physician has requested that you have an echocardiogram. Echocardiography is a painless test that uses sound waves to create images of your heart. It provides your doctor with information about the size and shape of your heart and how well your heart's chambers and valves are working. This procedure takes approximately one hour. There are no restrictions for this procedure. Please do NOT wear cologne, perfume, aftershave, or lotions (deodorant is allowed). Please arrive 15 minutes prior to your appointment time.  Lab work today we will call with any abnormal results  Your physician recommends that you schedule a follow-up appointment in: 3 months  If you have any questions or concerns before your next appointment please send [place] a message through [place] or call our office at [phone].    TO LEAVE A MESSAGE FOR THE NURSE SELECT OPTION 2, PLEASE LEAVE A MESSAGE INCLUDING: YOUR NAME DATE OF BIRTH CALL BACK NUMBER REASON FOR CALL**this is important as we prioritize the call backs  YOU WILL RECEIVE A CALL BACK THE SAME DAY AS LONG AS YOU CALL BEFORE 4:00 PM  At the Advanced Heart Failure Clinic, you and your health needs are our priority. As part of our continuing mission to provide you with exceptional heart care, we have created designated Provider Care Teams. These Care Teams include your primary Cardiologist (physician) and Advanced Practice Providers (APPs- Physician Assistants and Nurse Practitioners) who all work together to provide you with the care you need, when you need it.   You may see any of the following providers on your designated Care Team at your next follow up: Dr Arvilla Meres Dr Marca Ancona Dr. Marcos Eke, NP Robbie Lis, [place] [facility],  [place] Brynda Peon, NP Karle Plumber, PharmD   Please be sure to bring in all your medications bottles to every appointment.    Thank you for choosing [facility]

## 2023-05-05 ENCOUNTER — Other Ambulatory Visit: Payer: Self-pay | Admitting: Family Medicine

## 2023-05-05 DIAGNOSIS — R258 Other abnormal involuntary movements: Secondary | ICD-10-CM

## 2023-05-06 ENCOUNTER — Other Ambulatory Visit (HOSPITAL_COMMUNITY): Payer: Self-pay

## 2023-05-06 ENCOUNTER — Telehealth (HOSPITAL_COMMUNITY): Payer: Self-pay

## 2023-05-06 MED ORDER — HYDROCODONE-ACETAMINOPHEN 7.5-325 MG PO TABS
1.0000 | ORAL_TABLET | Freq: Four times a day (QID) | ORAL | 0 refills | Status: DC | PRN
Start: 2023-05-06 — End: 2023-09-18
  Filled 2023-05-06: qty 120, 30d supply, fill #0

## 2023-05-06 MED ORDER — GABAPENTIN 600 MG PO TABS
600.0000 mg | ORAL_TABLET | Freq: Three times a day (TID) | ORAL | 0 refills | Status: DC
Start: 2023-05-06 — End: 2023-05-15
  Filled 2023-05-06: qty 90, 30d supply, fill #0

## 2023-05-06 NOTE — Telephone Encounter (Signed)
Referral faxed to [facility] on 05/06/23 for the Chronic Thromboembolic Pulmonary Program

## 2023-05-06 NOTE — Telephone Encounter (Signed)
Advanced Heart Failure Patient Advocate Encounter  Received notification from Linwood Dibbles that [facility] shipped the free trial of Opusmit to the patient today.

## 2023-05-08 ENCOUNTER — Telehealth: Payer: Self-pay | Admitting: Pharmacist

## 2023-05-08 ENCOUNTER — Other Ambulatory Visit (HOSPITAL_COMMUNITY): Payer: Self-pay

## 2023-05-08 MED ORDER — MOUNJARO 2.5 MG/0.5ML ~~LOC~~ SOAJ: 2.5000 mg | SUBCUTANEOUS | 11 refills | Status: DC

## 2023-05-08 NOTE — Telephone Encounter (Signed)
Pt left voicemail regarding decreasing his Mounjaro. Reviewed Dr Alford Highland note from a few days ago,   "9. Obesity: He is on Mounjaro with significant weight loss.  He is worried about muscle loss, wants to cut back on dose.  He will discuss with pharmacy clinic."  Spoke with pt. Discussed that resistance training is the best way to keep up muscle mass. He has started doing this. Using resistance bands and weights. He has also noticed major reduction in appetite where he's full after a bite. Would like to decrease his dose to 2.5mg  as he reported tolerating that dose better. Rx sent in with refills.

## 2023-05-10 ENCOUNTER — Other Ambulatory Visit (HOSPITAL_COMMUNITY): Payer: Self-pay

## 2023-05-14 ENCOUNTER — Other Ambulatory Visit (HOSPITAL_COMMUNITY): Payer: Self-pay

## 2023-05-15 ENCOUNTER — Other Ambulatory Visit (HOSPITAL_COMMUNITY): Payer: Self-pay

## 2023-05-15 MED ORDER — GABAPENTIN 600 MG PO TABS
600.0000 mg | ORAL_TABLET | Freq: Three times a day (TID) | ORAL | 0 refills | Status: DC
  Filled 2023-05-15 – 2023-06-06 (×2): qty 90, 30d supply, fill #0

## 2023-05-15 MED ORDER — HYDROCODONE-ACETAMINOPHEN 7.5-325 MG PO TABS
1.0000 | ORAL_TABLET | Freq: Four times a day (QID) | ORAL | 0 refills | Status: DC | PRN
Start: 2023-05-15 — End: 2023-09-18
  Filled 2023-05-15 – 2023-06-06 (×2): qty 120, 30d supply, fill #0

## 2023-05-23 ENCOUNTER — Encounter (INDEPENDENT_AMBULATORY_CARE_PROVIDER_SITE_OTHER): Payer: Self-pay

## 2023-05-28 ENCOUNTER — Ambulatory Visit (HOSPITAL_COMMUNITY): Admission: RE | Admit: 2023-05-28 | Payer: Medicare Other | Source: Ambulatory Visit

## 2023-05-28 DIAGNOSIS — I509 Heart failure, unspecified: Secondary | ICD-10-CM | POA: Diagnosis not present

## 2023-05-28 DIAGNOSIS — I110 Hypertensive heart disease with heart failure: Secondary | ICD-10-CM | POA: Insufficient documentation

## 2023-05-28 DIAGNOSIS — E669 Obesity, unspecified: Secondary | ICD-10-CM | POA: Insufficient documentation

## 2023-05-28 DIAGNOSIS — I5032 Chronic diastolic (congestive) heart failure: Secondary | ICD-10-CM | POA: Diagnosis present

## 2023-05-28 DIAGNOSIS — E119 Type 2 diabetes mellitus without complications: Secondary | ICD-10-CM | POA: Diagnosis not present

## 2023-05-28 DIAGNOSIS — Z86711 Personal history of pulmonary embolism: Secondary | ICD-10-CM | POA: Insufficient documentation

## 2023-05-28 DIAGNOSIS — Z87891 Personal history of nicotine dependence: Secondary | ICD-10-CM | POA: Insufficient documentation

## 2023-05-28 DIAGNOSIS — I272 Pulmonary hypertension, unspecified: Secondary | ICD-10-CM | POA: Insufficient documentation

## 2023-05-28 LAB — ECHOCARDIOGRAM COMPLETE
AR max vel: 3.79 cm2
AV Peak grad: 6.4 mmHg
Ao pk vel: 1.26 m/s
Area-P 1/2: 4.57 cm2
S' Lateral: 2.5 cm

## 2023-05-28 NOTE — Telephone Encounter (Signed)
Advanced Heart Failure Patient Advocate Encounter  Spoke with patient. He has spoken with [facility] and will receive a shipment towards the end of the month.  Advised him to call back with issues.   Keith [name], CPhT

## 2023-05-28 NOTE — Progress Notes (Signed)
Echocardiogram 2D Echocardiogram has been performed.  Myreon Wimer N Geniva Lohnes,RDCS 05/28/2023, 10:50 AM

## 2023-06-02 ENCOUNTER — Other Ambulatory Visit: Payer: Self-pay | Admitting: Physical Medicine and Rehabilitation

## 2023-06-02 DIAGNOSIS — G894 Chronic pain syndrome: Secondary | ICD-10-CM

## 2023-06-06 ENCOUNTER — Other Ambulatory Visit (HOSPITAL_COMMUNITY): Payer: Self-pay

## 2023-06-07 ENCOUNTER — Other Ambulatory Visit: Payer: Self-pay | Admitting: Family Medicine

## 2023-06-07 DIAGNOSIS — G894 Chronic pain syndrome: Secondary | ICD-10-CM

## 2023-06-13 ENCOUNTER — Other Ambulatory Visit (HOSPITAL_COMMUNITY): Payer: Self-pay

## 2023-06-13 MED ORDER — NALOXONE HCL 4 MG/0.1ML NA LIQD: 1.0000 | NASAL | 3 refills | Status: DC | PRN

## 2023-06-13 MED ORDER — HYDROCODONE-ACETAMINOPHEN 7.5-325 MG PO TABS
1.0000 | ORAL_TABLET | Freq: Four times a day (QID) | ORAL | 0 refills | Status: DC | PRN
  Filled 2023-07-08: qty 120, 30d supply, fill #0

## 2023-06-13 MED ORDER — GABAPENTIN 600 MG PO TABS
600.0000 mg | ORAL_TABLET | Freq: Three times a day (TID) | ORAL | 0 refills | Status: DC | PRN
  Filled 2023-06-13 – 2023-07-08 (×2): qty 90, 30d supply, fill #0

## 2023-06-24 ENCOUNTER — Other Ambulatory Visit (HOSPITAL_COMMUNITY): Payer: Self-pay | Admitting: Pharmacist

## 2023-06-24 MED ORDER — ADEMPAS 2 MG PO TABS: 2.0000 mg | ORAL_TABLET | Freq: Three times a day (TID) | ORAL | 11 refills | Status: DC

## 2023-06-27 ENCOUNTER — Encounter (HOSPITAL_COMMUNITY): Payer: Self-pay

## 2023-06-27 ENCOUNTER — Ambulatory Visit (HOSPITAL_COMMUNITY): Admit: 2023-06-27 | Payer: Medicare Other | Admitting: Gastroenterology

## 2023-06-27 SURGERY — COLONOSCOPY WITH PROPOFOL
Anesthesia: Monitor Anesthesia Care

## 2023-07-01 ENCOUNTER — Encounter: Payer: Self-pay | Admitting: Family Medicine

## 2023-07-01 ENCOUNTER — Ambulatory Visit (INDEPENDENT_AMBULATORY_CARE_PROVIDER_SITE_OTHER): Payer: Medicare Other | Admitting: Family Medicine

## 2023-07-01 VITALS — BP 120/70 | HR 82 | Resp 16 | Ht 70.0 in

## 2023-07-01 DIAGNOSIS — R600 Localized edema: Secondary | ICD-10-CM

## 2023-07-01 DIAGNOSIS — E1140 Type 2 diabetes mellitus with diabetic neuropathy, unspecified: Secondary | ICD-10-CM | POA: Diagnosis not present

## 2023-07-01 DIAGNOSIS — N1831 Chronic kidney disease, stage 3a: Secondary | ICD-10-CM | POA: Diagnosis not present

## 2023-07-01 DIAGNOSIS — Z7985 Long-term (current) use of injectable non-insulin antidiabetic drugs: Secondary | ICD-10-CM | POA: Diagnosis not present

## 2023-07-01 DIAGNOSIS — M6281 Muscle weakness (generalized): Secondary | ICD-10-CM | POA: Diagnosis not present

## 2023-07-01 DIAGNOSIS — K5903 Drug induced constipation: Secondary | ICD-10-CM

## 2023-07-01 DIAGNOSIS — I10 Essential (primary) hypertension: Secondary | ICD-10-CM

## 2023-07-01 DIAGNOSIS — L89322 Pressure ulcer of left buttock, stage 2: Secondary | ICD-10-CM | POA: Insufficient documentation

## 2023-07-01 LAB — BASIC METABOLIC PANEL
BUN: 35 mg/dL — ABNORMAL HIGH (ref 6–23)
CO2: 23 mEq/L (ref 19–32)
Calcium: 9.1 mg/dL (ref 8.4–10.5)
Chloride: 108 mEq/L (ref 96–112)
Creatinine, Ser: 1.31 mg/dL (ref 0.40–1.50)
GFR: 54.98 mL/min — ABNORMAL LOW (ref >60.00)
Glucose, Bld: 94 mg/dL (ref 70–99)
Potassium: 4.1 mEq/L (ref 3.5–5.1)
Sodium: 141 mEq/L (ref 135–145)

## 2023-07-01 LAB — HEMOGLOBIN A1C: Hgb A1c MFr Bld: 5.1 % (ref 4.6–6.5)

## 2023-07-01 LAB — CK: Total CK: 70 U/L (ref 7–232)

## 2023-07-01 LAB — CBC
HCT: 26.6 % — ABNORMAL LOW (ref 39.0–52.0)
Hemoglobin: 8.5 g/dL — ABNORMAL LOW (ref 13.0–17.0)
MCHC: 31.9 g/dL (ref 30.0–36.0)
MCV: 92.6 fl (ref 78.0–100.0)
Platelets: 484 10*3/uL — ABNORMAL HIGH (ref 150.0–400.0)
RBC: 2.87 Mil/uL — ABNORMAL LOW (ref 4.22–5.81)
RDW: 19.5 % — ABNORMAL HIGH (ref 11.5–15.5)
WBC: 5.9 10*3/uL (ref 4.0–10.5)

## 2023-07-01 LAB — HEPATIC FUNCTION PANEL
ALT: 20 U/L (ref 0–53)
AST: 19 U/L (ref 0–37)
Albumin: 2.9 g/dL — ABNORMAL LOW (ref 3.5–5.2)
Alkaline Phosphatase: 129 U/L — ABNORMAL HIGH (ref 39–117)
Bilirubin, Direct: 0.1 mg/dL (ref 0.0–0.3)
Total Bilirubin: 0.4 mg/dL (ref 0.2–1.2)
Total Protein: 5.8 g/dL — ABNORMAL LOW (ref 6.0–8.3)

## 2023-07-01 NOTE — Assessment & Plan Note (Signed)
Could not be evaluated here in the office because he cannot stand up for long enough for this to be evaluated. Based on picture it seems stage 2, about 1-1.5 in. Has not been ambulatory in months, on wheel chair or bed and has decreased oral intake. Instructed increase protein intake, avoid pressure on affected area, a donut pillow or soft pillow may help. Continue Desitin at night. Keep area clean with soap and water. Referral to wound clinic placed.

## 2023-07-01 NOTE — Assessment & Plan Note (Signed)
Problem has been adequately controlled on nonpharmacologic treatment, last hemoglobin A1c in 12/2022 was 5.5.   He has been on [facility], prescribed by his cardiologist for weight loss and CVD protection, he has not taken medication in 2 weeks. Annual eye exam and foot care to continue. F/U in 6 months.

## 2023-07-01 NOTE — Assessment & Plan Note (Addendum)
Chronic problem. He feels like it has been worse for the past 2-3 weeks, no associated pain or erythema. Currently he is on torsemide 20 mg 1/2 tablet daily. Recommend compression stockings and continue appropriate skin care.

## 2023-07-01 NOTE — Assessment & Plan Note (Signed)
BP adequately controlled. Continue carvedilol 12.5 mg twice daily,Spironolactone 25 mg daily, and Amlodipine 10 mg daily. Continue low-salt diet. Continue monitoring BP regularly.

## 2023-07-01 NOTE — Assessment & Plan Note (Signed)
Problem has been stable ,Cr 1.2-1.3 and e GFR 55-60's. Glucose and BP have been adequately controlled. Continue adequate hydration, low-salt diet, and avoidance of NSAIDs.

## 2023-07-01 NOTE — Progress Notes (Signed)
HPI: [name] is a 72 y.o. male with a PMHx significant for HTN, HLD, DM2, CKD III, pulmonary HTN,recurrent PE on chronic anticoagulation,chronic pain, and GERD, who is here with his wife today for chronic disease management.  Last seen on 03/20/2023. He has seen his cardiologist and doing pulmonology rehab since his last visit.  and had an echocardiogram on 8/20.   Hypertension: He is taking amlodipine 10 mg daily, carvedilol 12.5 mg daily, and spironolactone 25 mg daily.  BP readings at home: He has been checking at home, about same as today. His readings have been "normal."  Side effects: none  Negative for unusual or severe headache, visual changes, exertional chest pain, or dyspnea. CKD III: He has not noted gross hematuria, decreased urine output,or foam in urine.  Lab Results  Component Value Date   CREATININE 1.33 (H) 05/03/2023   BUN 23 05/03/2023   NA 141 05/03/2023   K 3.7 05/03/2023   CL 106 05/03/2023   CO2 22 05/03/2023   Diabetes Mellitus II: Dx'ed 9+ years ago.  - Checking BG at home: He has not been checking at home.  He is on Mounjaro for weight loss. Negative for polydipsia,polyuria, or polyphagia.  Lab Results  Component Value Date   HGBA1C 5.5 12/24/2022   Lab Results  Component Value Date   MICROALBUR 1.9 03/21/2023   CKD III:  He has no gross hematuria, foam in urine, or decreased urine output.  He also reports recent problems within the last month which he attributes to his mounjaro injections.  He endorses general weakness that prevents him from raising his arms or standing up from wheel chair. Shoulders pain, lower back pain,and hip pain.  He has recently completed physical therapy for general weakness, did not note major benefit. Chronic pain: Spinal stenosis,OA,and thoracic myelopathy. He is on Hydrocodone-Acetaminophen, pain management visits every 30 days. Also on Cymbalta 60 mg daily and Gabapentin 600 mg tid.  He also  reports worsening LE edema that prevents him from putting on shoes, "severe" lack of appetite. His cardiologist has been prescribing Mounjaro, has been on 5 mg and recently dose has been decreased to 2.5 mg daily, and hasn't taken any for 2 weeks.  He denies orthopnea,PND, nausea, and vomiting.  Denies LE pain other than his neuropathic pain, no erythema.  He is on Torsemide 20 mg 1.5 tabs daily. Had echo done on 05/28/23 LVEF 70-75 % and grade I diastolic dysfunction.  -He also reports a sore on the left buttocks for about the last 2 weeks. His wife took a picture this morning. It seems to be getting worse. He is not ambulatory, he is being stagnant in his bed and wheelchair. He has a electric wheel chair, which he has not used yet. Requesting form completed for transportation arrangements. His wife is having difficulty helping him to get in and off the car as well as pushing his wheel chair.  Constipation: He states he often has to strain for bowel movements. He takes Miralax 17 g inconsistently. He endorses occasional blood in the stool.  Iron def anemia, he resume iron supplementation. He was supposed to have colonoscopy done on 06/27/23 and cancelled. According to pt, he did not needed because of his age. Last colonoscopy in 01/2018.  Review of Systems  Constitutional:  Positive for activity change and appetite change. Negative for fever.  HENT:  Negative for nosebleeds and sore throat.   Respiratory:  Negative for cough and wheezing.  Gastrointestinal:  Positive for constipation. Negative for abdominal pain.  Endocrine: Negative for cold intolerance and heat intolerance.  Genitourinary:  Negative for dysuria.  Musculoskeletal:  Positive for arthralgias and gait problem.  Skin:  Negative for rash.  Neurological:  Negative for syncope and facial asymmetry.  Psychiatric/Behavioral:  Negative for confusion and hallucinations.   See other pertinent positives and negatives in  HPI.  Current Outpatient Medications on File Prior to Visit  Medication Sig Dispense Refill   acetaminophen (TYLENOL) 325 MG tablet Take 1-2 tablets (325-650 mg total) by mouth every 4 (four) hours as needed for mild pain.     amLODipine (NORVASC) 10 MG tablet TAKE 1 TABLET BY MOUTH DAILY 100 tablet 2   Blood Glucose Monitoring Suppl (ACCU-CHEK GUIDE ME) w/Device KIT USE TO TEST BLOOD SUGARS 1-2 TIMES DAILY. 1 kit 0   carvedilol (COREG) 12.5 MG tablet Take 1 tablet (12.5 mg total) by mouth 2 (two) times daily with a meal. 180 tablet 3   DULoxetine (CYMBALTA) 60 MG capsule TAKE 1 CAPSULE BY MOUTH EVERY DAY 90 capsule 0   ELIQUIS 5 MG TABS tablet TAKE 1 TABLET BY MOUTH TWICE A DAY 60 tablet 11   ferrous sulfate 325 (65 FE) MG tablet TAKE 1 TABLET BY MOUTH EVERY DAY WITH BREAKFAST 90 tablet 1   gabapentin (NEURONTIN) 600 MG tablet Take 600 mg by mouth 3 (three) times daily.     gabapentin (NEURONTIN) 600 MG tablet Take 1 tablet (600 mg total) by mouth 3 (three) times daily for neuropathy 90 tablet 0   gabapentin (NEURONTIN) 600 MG tablet Take 1 tablet (600 mg total) by mouth 3 (three) times daily as needed for neuropathy 90 tablet 0   HYDROcodone-acetaminophen (NORCO) 7.5-325 MG tablet Take 1 tablet by mouth in the morning, at noon, in the evening, and at bedtime. 7.5 mg/375 mg     HYDROcodone-acetaminophen (NORCO) 7.5-325 MG tablet Take 1 tablet by mouth 4 (four) times daily as needed for pain. 120 tablet 0   HYDROcodone-acetaminophen (NORCO) 7.5-325 MG tablet Take 1 tablet by mouth 4 (four) times daily as needed for pain 120 tablet 0   HYDROcodone-acetaminophen (NORCO) 7.5-325 MG tablet Take 1 tablet by mouth 4 (four) times daily as needed. 120 tablet 0   hydrocortisone (ANUSOL-HC) 2.5 % rectal cream Place 1 application. rectally as needed for hemorrhoids or anal itching.     levETIRAcetam (KEPPRA) 250 MG tablet TAKE 1 TABLET BY MOUTH TWICE  DAILY 180 tablet 2   lidocaine (LIDODERM) 5 % Place 1  patch onto the skin daily as needed (pain). Remove & Discard patch within 12 hours or as directed by MD     naloxone (NARCAN) nasal spray 4 mg/0.1 mL Place 1 spray into the nose as needed (accidental overdose).     naloxone (NARCAN) nasal spray 4 mg/0.1 mL Place 1 spray into the nose as needed. 1 each 3   omeprazole (PRILOSEC) 40 MG capsule TAKE 1 CAPSULE BY MOUTH DAILY 100 capsule 2   polyethylene glycol (MIRALAX / GLYCOLAX) 17 g packet Take 17 g by mouth daily as needed for mild constipation.     Riociguat (ADEMPAS) 2 MG TABS Take 2 mg by mouth in the morning, at noon, and at bedtime. 90 tablet 11   rOPINIRole (REQUIP) 1 MG tablet TAKE 1 TABLET BY MOUTH AT  BEDTIME 100 tablet 2   rosuvastatin (CRESTOR) 20 MG tablet Take 1 tablet (20 mg total) by mouth daily. 90 tablet 3  spironolactone (ALDACTONE) 25 MG tablet Take 1 tablet (25 mg total) by mouth daily. 90 tablet 3   tirzepatide (MOUNJARO) 2.5 MG/0.5ML Pen Inject 2.5 mg into the skin once a week. 2 mL 11   torsemide (DEMADEX) 20 MG tablet TAKE 1 AND 1/2 TABLETS DAILY BY MOUTH 135 tablet 2   traZODone (DESYREL) 50 MG tablet Take 0.5-1 tablets (25-50 mg total) by mouth at bedtime as needed. for sleep 90 tablet 0   No current facility-administered medications on file prior to visit.    Past Medical History:  Diagnosis Date   Allergy    CHF (congestive heart failure) (HCC)    Chronic back pain    Diabetes mellitus without complication (HCC)    DNR (do not resuscitate) 11/19/2020   Duodenal ulcer hemorrhage 08/29/2014   ED (erectile dysfunction)    Esophageal stricture 08/30/2014   Glucose intolerance (impaired glucose tolerance)    Hiatal hernia 08/30/2014   Hypertension    Hypokalemia 04/11/2013   MRSA carrier 08/30/2014   Obesity    Osteoarthritis    Pneumonia    Pulmonary embolism (HCC) 06/2020   Scoliosis 2016   Spinal stenosis of lumbar region    Urinary tract infection 04/11/2013   Allergies  Allergen Reactions   Aspirin  Other (See Comments)    Irritates the stomach and the patient developed ulcers, also   Lisinopril Other (See Comments)    Caused a body ache    Social History   Socioeconomic History   Marital status: Married    Spouse name: Wynona Canes   Number of children: 3   Years of education: 4 years college   Highest education level: Bachelor's degree (e.g., BA, AB, BS)  Occupational History   Occupation: retired  Tobacco Use   Smoking status: Former    Current packs/day: 0.00    Average packs/day: 1 pack/day for 25.0 years (25.0 ttl pk-yrs)    Types: Cigarettes    Start date: 44    Quit date: 1995    Years since quitting: 29.7    Passive exposure: Never   Smokeless tobacco: Never  Vaping Use   Vaping status: Never Used  Substance and Sexual Activity   Alcohol use: Yes    Comment: holidays and special occasions   Drug use: Not Currently   Sexual activity: Yes    Birth control/protection: None  Other Topics Concern   Not on file  Social History Narrative   Married, 5 kids, 14 grand, 2 great grand, watch TV, Armed forces operational officer, traveling   Social Determinants of Health   Financial Resource Strain: Low Risk  (03/16/2023)   Overall Financial Resource Strain (CARDIA)    Difficulty of Paying Living Expenses: Not hard at all  Food Insecurity: No Food Insecurity (03/16/2023)   Hunger Vital Sign    Worried About Running Out of Food in the Last Year: Never true    Ran Out of Food in the Last Year: Never true  Transportation Needs: No Transportation Needs (03/16/2023)   PRAPARE - Administrator, Civil Service (Medical): No    Lack of Transportation (Non-Medical): No  Physical Activity: Insufficiently Active (03/16/2023)   Exercise Vital Sign    Days of Exercise per Week: 3 days    Minutes of Exercise per Session: 30 min  Stress: No Stress Concern Present (03/16/2023)   [employer] of Occupational Health - Occupational Stress Questionnaire    Feeling of Stress : Not at  all  Social Connections:  Moderately Integrated (03/16/2023)   Social Connection and Isolation Panel [NHANES]    Frequency of Communication with Friends and Family: More than three times a week    Frequency of Social Gatherings with Friends and Family: Twice a week    Attends Religious Services: More than 4 times per year    Active Member of [employer] or Organizations: No    Attends Banker Meetings: Not on file    Marital Status: Married   Vitals:   07/01/23 0906  BP: 120/70  Pulse: 82  Resp: 16  SpO2: 96%   Body mass index is 37.95 kg/m.  Physical Exam Vitals and nursing note reviewed.  Constitutional:      General: He is not in acute distress.    Appearance: He is well-developed.  HENT:     Head: Normocephalic and atraumatic.     Mouth/Throat:     Mouth: Mucous membranes are moist.  Eyes:     Conjunctiva/sclera: Conjunctivae normal.  Cardiovascular:     Rate and Rhythm: Normal rate and regular rhythm.     Heart sounds: No murmur heard.    Comments: Bilateral pitting pedal edema, 2+ Pulmonary:     Effort: Pulmonary effort is normal. No respiratory distress.     Breath sounds: Normal breath sounds.  Abdominal:     Palpations: Abdomen is soft. There is no mass.     Tenderness: There is no abdominal tenderness.  Musculoskeletal:     Right lower leg: 3+ Edema present.     Left lower leg: 3+ Edema present.  Skin:    General: Skin is warm.     Findings: Abrasion present. No erythema or rash.     Comments: See picture on his wife's phone. He cannot stand up for me to evaluate area. Picture superficial ulcer, no erythema.  Neurological:     Mental Status: He is alert and oriented to person, place, and time.     Comments: He is in a wheel chair.  Psychiatric:        Mood and Affect: Affect normal. Mood is anxious.   ASSESSMENT AND PLAN:  Mr. Gerges was seen today for chronic disease follow up.   Orders Placed This Encounter  Procedures   Basic metabolic  panel   Hepatic function panel   CK   Hemoglobin A1c   CBC   Microalbumin / creatinine urine ratio   Ambulatory referral to Wound Clinic   Lab Results  Component Value Date   HGBA1C 5.1 07/01/2023   Lab Results  Component Value Date   ALT 20 07/01/2023   AST 19 07/01/2023   ALKPHOS 129 (H) 07/01/2023   BILITOT 0.4 07/01/2023   Lab Results  Component Value Date   NA 141 07/01/2023   CL 108 07/01/2023   K 4.1 07/01/2023   CO2 23 07/01/2023   BUN 35 (H) 07/01/2023   CREATININE 1.31 07/01/2023   GFR 54.98 (L) 07/01/2023   CALCIUM 9.1 07/01/2023   PHOS 3.0 03/05/2022   ALBUMIN 2.9 (L) 07/01/2023   GLUCOSE 94 07/01/2023   Lab Results  Component Value Date   CKTOTAL 70 07/01/2023   Lab Results  Component Value Date   MICROALBUR 1.9 03/21/2023   MICROALBUR 11.3 (H) 01/17/2022   Pressure injury of left buttock, stage 2 (HCC) Assessment & Plan: Could not be evaluated here in the office because he cannot stand up for long enough for this to be evaluated. Based on picture it seems stage 2,  about 1-1.5 in. Has not been ambulatory in months, on wheel chair or bed and has decreased oral intake. Instructed increase protein intake, avoid pressure on affected area, a donut pillow or soft pillow may help. Continue Desitin at night. Keep area clean with soap and water. Referral to wound clinic placed.  Orders: -     Ambulatory referral to Wound Clinic  Muscle weakness (generalized) Some of his chronic co morbilities can be contributing factors, among some lumbar spinal stenosis, generalized OA,and thoracic myelopathy. He is not interested in resuming PT for now. Fall precautions discussed. [place] form transportation completed. He has an Runner, broadcasting/film/video chair at home.  -     Hepatic function panel; Future -     CK; Future -     CBC; Future  Essential hypertension Assessment & Plan: BP adequately controlled. Continue carvedilol 12.5 mg twice daily,Spironolactone 25 mg  daily, and Amlodipine 10 mg daily. Continue low-salt diet. Continue monitoring BP regularly.   Orders: -     Basic metabolic panel; Future  Stage 3a chronic kidney disease (HCC) Assessment & Plan: Problem has been stable ,Cr 1.2-1.3 and e GFR 55-60's. Glucose and BP have been adequately controlled. Continue adequate hydration, low-salt diet, and avoidance of NSAIDs.  Orders: -     CBC; Future -     Microalbumin / creatinine urine ratio; Future  Bilateral lower extremity edema Assessment & Plan: Chronic problem. He feels like it has been worse for the past 2-3 weeks, no associated pain or erythema. Currently he is on torsemide 20 mg 1/2 tablet daily. Recommend compression stockings and continue appropriate skin care.  Type 2 diabetes mellitus with diabetic neuropathy, without long-term current use of insulin (HCC) Assessment & Plan: Problem has been adequately controlled on nonpharmacologic treatment, last hemoglobin A1c in 12/2022 was 5.5.   He has been on [facility], prescribed by his cardiologist for weight loss and CVD protection, he has not taken medication in 2 weeks. Annual eye exam and foot care to continue. F/U in 6 months.  Orders: -     Hemoglobin A1c; Future  Drug induced constipation Assessment & Plan: We discussed some side effects of opioid medications as well as  Ferrous sulfate. He can take iron supplementation every other day with vit C. He has had blood in stool, which has been going on for years, intermittently. Colonoscopy cancelled. Miralax daily and can also start bisacodyl suppositories. If not better we could add Linzess. Adequate hydration and water intake also recommended.  I spent a total of 52 minutes in both face to face and non face to face activities for this visit on the date of this encounter. During this time history was obtained and documented, examination was performed, prior labs reviewed, and assessment/plan discussed.  Return in about  2 months (around 08/31/2023) for chronic problems.  I, Rolla Etienne Wierda, acting as a scribe for Oval Cavazos [place], MD., have documented all relevant documentation on the behalf of Mihir Flanigan [place], MD, as directed by  Quentavious Rittenhouse [place], MD while in the presence of Rajendra Spiller [place], MD.   I, Allean Montfort [place], MD, have reviewed all documentation for this visit. The documentation on 07/01/23 for the exam, diagnosis, procedures, and orders are all accurate and complete.  Zhaire Locker G. [place], MD  [facility]. [place] office.

## 2023-07-01 NOTE — Assessment & Plan Note (Signed)
We discussed some side effects of opioid medications. Miralax daily and can also start bisacodyl suppositories. If not better we could add Linzess. Adequate hydration and water intake also recommended.

## 2023-07-01 NOTE — Patient Instructions (Addendum)
A few things to remember from today's visit:  Controlled type 2 diabetes mellitus with hyperglycemia, without long-term current use of insulin (HCC) - Plan: Hemoglobin A1c  Essential hypertension - Plan: Basic metabolic panel  Stage 3a chronic kidney disease (HCC) - Plan: CBC, Microalbumin / creatinine urine ratio  Spinal stenosis of lumbar region, unspecified whether neurogenic claudication present  Constipation, unspecified constipation type  Bilateral lower extremity edema  Muscle weakness (generalized) - Plan: Hepatic function panel, CK, CBC  Pressure injury of left buttock, stage 2 (HCC) - Plan: Ambulatory referral to Wound Clinic  Let me know if you decide about PT. Lower extremity elevation and/or compression stocking will help with legs swelling. No changes in medications for now. Monitor for new symptoms. Hold on [place] for now.  For constipation daily Miralax at bedtime and bisacodyl suppositories.   If you need refills for medications you take chronically, please call your pharmacy. Do not use My Chart to request refills or for acute issues that need immediate attention. If you send a my chart message, it may take a few days to be addressed, specially if I am not in the office.  Please be sure medication list is accurate. If a new problem present, please set up appointment sooner than planned today.

## 2023-07-03 ENCOUNTER — Other Ambulatory Visit: Payer: Self-pay

## 2023-07-03 DIAGNOSIS — I10 Essential (primary) hypertension: Secondary | ICD-10-CM

## 2023-07-03 DIAGNOSIS — D509 Iron deficiency anemia, unspecified: Secondary | ICD-10-CM

## 2023-07-03 DIAGNOSIS — M6281 Muscle weakness (generalized): Secondary | ICD-10-CM

## 2023-07-04 ENCOUNTER — Telehealth: Payer: Self-pay | Admitting: Family Medicine

## 2023-07-04 ENCOUNTER — Other Ambulatory Visit: Payer: Self-pay | Admitting: Physical Medicine and Rehabilitation

## 2023-07-04 LAB — MICROALBUMIN / CREATININE URINE RATIO
Creatinine,U: 23.3 mg/dL
Microalb Creat Ratio: 3 mg/g (ref 0.0–30.0)
Microalb, Ur: <0.7 mg/dL (ref 0.0–1.9)

## 2023-07-04 NOTE — Telephone Encounter (Signed)
Patient dropped off document  [employer] Form , to be filled out by provider. The form was mostly completed, however Part B question 5 is incomplete and need to be filled out per pt's wife.. Patient requested to send it back via Call Patient to pick up within ASAP. Document is located in providers tray at front office.Please advise at [phone]

## 2023-07-05 NOTE — Telephone Encounter (Signed)
Form is filled out & completed. Patient is aware.

## 2023-07-08 ENCOUNTER — Other Ambulatory Visit (HOSPITAL_COMMUNITY): Payer: Self-pay

## 2023-07-10 ENCOUNTER — Other Ambulatory Visit (HOSPITAL_COMMUNITY): Payer: Self-pay

## 2023-07-10 MED ORDER — GABAPENTIN 800 MG PO TABS
800.0000 mg | ORAL_TABLET | Freq: Three times a day (TID) | ORAL | 0 refills | Status: DC
Start: 2023-07-10 — End: 2023-08-08
  Filled 2023-07-10 – 2023-08-07 (×3): qty 90, 30d supply, fill #0

## 2023-07-10 MED ORDER — HYDROCODONE-ACETAMINOPHEN 7.5-325 MG PO TABS
1.0000 | ORAL_TABLET | Freq: Four times a day (QID) | ORAL | 0 refills | Status: DC | PRN
Start: 2023-07-10 — End: 2023-09-18
  Filled 2023-07-10 – 2023-08-07 (×2): qty 120, 30d supply, fill #0

## 2023-07-22 ENCOUNTER — Other Ambulatory Visit (HOSPITAL_COMMUNITY): Payer: Self-pay

## 2023-07-22 ENCOUNTER — Encounter (HOSPITAL_BASED_OUTPATIENT_CLINIC_OR_DEPARTMENT_OTHER): Payer: Medicare Other | Attending: General Surgery | Admitting: General Surgery

## 2023-07-22 DIAGNOSIS — E1122 Type 2 diabetes mellitus with diabetic chronic kidney disease: Secondary | ICD-10-CM | POA: Diagnosis not present

## 2023-07-22 DIAGNOSIS — K219 Gastro-esophageal reflux disease without esophagitis: Secondary | ICD-10-CM | POA: Diagnosis not present

## 2023-07-22 DIAGNOSIS — E1142 Type 2 diabetes mellitus with diabetic polyneuropathy: Secondary | ICD-10-CM | POA: Insufficient documentation

## 2023-07-22 DIAGNOSIS — L0231 Cutaneous abscess of buttock: Secondary | ICD-10-CM | POA: Diagnosis not present

## 2023-07-22 DIAGNOSIS — L89323 Pressure ulcer of left buttock, stage 3: Secondary | ICD-10-CM | POA: Diagnosis not present

## 2023-07-22 DIAGNOSIS — N183 Chronic kidney disease, stage 3 unspecified: Secondary | ICD-10-CM | POA: Insufficient documentation

## 2023-07-22 DIAGNOSIS — M1990 Unspecified osteoarthritis, unspecified site: Secondary | ICD-10-CM | POA: Diagnosis not present

## 2023-07-22 DIAGNOSIS — E11622 Type 2 diabetes mellitus with other skin ulcer: Secondary | ICD-10-CM | POA: Diagnosis present

## 2023-07-22 DIAGNOSIS — L89153 Pressure ulcer of sacral region, stage 3: Secondary | ICD-10-CM | POA: Insufficient documentation

## 2023-07-22 NOTE — Progress Notes (Signed)
Keith [name] ([id number]) [id number] Page 1 of 9 Visit Report for 07/22/2023 Allergy List Details Patient Name: Date of Service: Keith [name] 07/22/2023 8:00 A M Medical Record Number: [id number] Patient Account Number: [id number] Date of Birth/Sex: Treating RN: [birth year] (72 y.o. Dianna Limbo Primary Care Keith [name]: Keith [name] Other Clinician: Referring Keith [name]: Treating Keith [name]/Extender: Keith [name] Keith [name] Weeks in Treatment: 0 Allergies Active Allergies aspirin Severity: Moderate lisinopril Severity: Severe Allergy Notes Electronic Signature(s) Signed: 07/22/2023 4:30:49 PM By: Karie Schwalbe RN Entered By: Karie Schwalbe on 07/22/2023 05:26:51 -------------------------------------------------------------------------------- Arrival Information Details Patient Name: Date of Service: Keith [name], Keith [name]. 07/22/2023 8:00 A M Medical Record Number: [id number] Patient Account Number: [id number] Date of Birth/Sex: Treating RN: [birth year] (72 y.o. Dianna Limbo Primary Care Shawne Bulow: Keith [name] Other Clinician: Referring Keith [name]: Treating Keith [name]/Extender: Keith [name] Keith [name] Weeks in Treatment: 0 Visit Information Patient Arrived: Wheel Chair Arrival Time: 08:20 Accompanied By: spouse Transfer Assistance: Michiel Sites Lift Patient Identification Verified: Yes Patient Has Alerts: Yes Patient Alerts: Patient on Blood Thinner Eliquis Electronic Signature(s) Signed: 07/22/2023 4:30:49 PM By: Karie Schwalbe RN Entered By: Karie Schwalbe on 07/22/2023 05:26:14 Keith [name] ([id number]) [id number] Page 2 of 9 -------------------------------------------------------------------------------- Clinic Level of Care Assessment Details Patient Name: Date of Service: Keith [name] 07/22/2023 8:00 A M Medical Record Number: [id number] Patient Account Number:  [id number] Date of Birth/Sex: Treating RN: [birth year] (72 y.o. Dianna Limbo Primary Care Keith [name]: Keith [name] Other Clinician: Referring Keith [name]: Treating Keith [name]/Extender: Keith [name] Keith [name] Weeks in Treatment: 0 Clinic Level of Care Assessment Items TOOL 1 Quantity Score X- 1 0 Use when EandM and Procedure is performed on INITIAL visit ASSESSMENTS - Nursing Assessment / Reassessment X- 1 20 General Physical Exam (combine w/ comprehensive assessment (listed just below) when performed on new pt. evals) X- 1 25 Comprehensive Assessment (HX, ROS, Risk Assessments, Wounds Hx, etc.) ASSESSMENTS - Wound and Skin Assessment / Reassessment X- 1 10 Dermatologic / Skin Assessment (not related to wound area) ASSESSMENTS - Ostomy and/or Continence Assessment and Care []  - 0 Incontinence Assessment and Management []  - 0 Ostomy Care Assessment and Management (repouching, etc.) PROCESS - Coordination of Care []  - 0 Simple Patient / Family Education for ongoing care X- 1 20 Complex (extensive) Patient / Family Education for ongoing care X- 1 10 Staff obtains Chiropractor, Records, [name] Results / Process Orders est X- 1 10 Staff telephones HHA, Nursing Homes / Clarify orders / etc []  - 0 Routine Transfer to another Facility (non-emergent condition) []  - 0 Routine Hospital Admission (non-emergent condition) X- 1 15 New Admissions / Manufacturing engineer / Ordering NPWT Apligraf, etc. , []  - 0 Emergency Hospital Admission (emergent condition) PROCESS - Special Needs []  - 0 Pediatric / Minor Patient Management []  - 0 Isolation Patient Management []  - 0 Hearing / Language / Visual special needs []  - 0 Assessment of Community assistance (transportation, D/C planning, etc.) []  - 0 Additional assistance / Altered mentation X- 1 15 Support Surface(s) Assessment (bed, cushion, seat, etc.) INTERVENTIONS - Miscellaneous []  - 0 External ear exam []  - 0 Patient  Transfer (multiple staff / Nurse, adult / Similar devices) []  - 0 Simple Staple / Suture removal (25 or less) []  - 0 Complex Staple / Suture removal (26 or more) []  - 0 Hypo/Hyperglycemic Management (do not check if billed separately) []  - 0 Ankle / Brachial Index (ABI) - do not check if billed separately Has  Debridement Measurements L x W x D (cm) 0.283 0.055 N/A Post Debridement Volume: (cm) Category/Stage III Category/Stage III N/A Post Debridement Stage: Scarring: Yes No Abnormalities Noted N/A Periwound Skin Texture: No Abnormalities Noted No Abnormalities Noted N/A Periwound Skin Moisture: No Abnormalities Noted No Abnormalities Noted N/A Periwound Skin Color: No Abnormality No Abnormality N/A Temperature: Debridement Debridement N/A Procedures Performed: Treatment Notes Electronic Signature(s) Signed: 07/22/2023 9:13:47 AM By: Keith Guess MD FACS Entered By: Keith [name] on 07/22/2023 06:13:46 -------------------------------------------------------------------------------- Multi-Disciplinary Care Plan Details Patient Name: Date of Service: Keith [name], Keith [name]. 07/22/2023 8:00 A [name], [name]  ([id number]) [id number] Page 5 of 9 Medical Record Number: [id number] Patient Account Number: [id number] Date of Birth/Sex: Treating RN: [birth year] (72 y.o. Dianna Limbo Primary Care Kaitland Lewellyn: Keith [name] Other Clinician: Referring Peretz Thieme: Treating Karanvir Balderston/Extender: Keith [name] Keith [name] Weeks in Treatment: 0 Active Inactive Wound/Skin Impairment Nursing Diagnoses: Impaired tissue integrity Goals: Patient/caregiver will verbalize understanding of skin care regimen Date Initiated: 07/22/2023 Target Resolution Date: 10/08/2023 Goal Status: Active Interventions: Assess ulceration(s) every visit Treatment Activities: Skin care regimen initiated : 07/22/2023 Notes: Electronic Signature(s) Signed: 07/22/2023 4:30:49 PM By: Karie Schwalbe RN Entered By: Karie Schwalbe on 07/22/2023 06:47:25 -------------------------------------------------------------------------------- Pain Assessment Details Patient Name: Date of Service: Keith School [name]. 07/22/2023 8:00 A M Medical Record Number: [id number] Patient Account Number: [id number] Date of Birth/Sex: Treating RN: [birth year] (72 y.o. Dianna Limbo Primary Care Davarius Ridener: Keith [name] Other Clinician: Referring Jaykwon Morones: Treating Walid Haig/Extender: Keith [name] Keith [name] Weeks in Treatment: 0 Active Problems Location of Pain Severity and Description of Pain Patient Has Paino Yes Site Locations Pain Location: Generalized Pain With Dressing Change: No Duration of the Pain. Constant / Intermittento Constant Rate the pain. Current Pain Level: 6 Worst Pain Level: 10 Least Pain Level: 3 Tolerable Pain Level: 2 Character of Pain Describe the Pain: Difficult to Pinpoint Pain Management and Medication Keith [name], Keith [name] ([id number]) [phone].pdf Page 6 of 9 Current Pain Management: Medication: No Cold Application: No Rest: No Massage:  No Activity: No RosarioE.N.S.: No Heat Application: No Leg drop or elevation: No Is the Current Pain Management Adequate: Adequate How does your wound impact your activities of daily livingo Sleep: No Bathing: No Appetite: No Relationship With Others: No Bladder Continence: No Emotions: No Bowel Continence: No Work: No Toileting: No Drive: No Dressing: No Hobbies: No Electronic Signature(s) Signed: 07/22/2023 4:30:49 PM By: Karie Schwalbe RN Entered By: Karie Schwalbe on 07/22/2023 06:03:22 -------------------------------------------------------------------------------- Patient/Caregiver Education Details Patient Name: Date of Service: Keith [name] 10/14/2024andnbsp8:00 A M Medical Record Number: [id number] Patient Account Number: [id number] Date of Birth/Gender: Treating RN: 19-Mar-1951 (72 y.o. Dianna Limbo Primary Care Physician: Keith [name] Other Clinician: Referring Physician: Treating Physician/Extender: Keith [name] Keith [name] Weeks in Treatment: 0 Education Assessment Education Provided To: Patient Education Topics Provided Wound/Skin Impairment: Methods: Demonstration, Explain/Verbal Responses: State content correctly Electronic Signature(s) Signed: 07/22/2023 4:30:49 PM By: Karie Schwalbe RN Entered By: Karie Schwalbe on 07/22/2023 06:47:37 -------------------------------------------------------------------------------- Wound Assessment Details Patient Name: Date of Service: Keith School [name]. 07/22/2023 8:00 A M Medical Record Number: [id number] Patient Account Number: [id number] Date of Birth/Sex: Treating RN: May 03, 1951 (72 y.o. Dianna Limbo Primary Care Tyree Fluharty: Keith [name] Other Clinician: Referring Mithra Spano: Treating Lailani Tool/Extender: Keith [name] Keith [name] Weeks in Treatment: 0 Wound Status Wound Number: 1 Primary Etiology: Pressure Ulcer Wound Location: Left Gluteus Wound Status: Open Keith [name] ([id number]) [id number] Page 7 of  Keith [name] ([id number]) [id number] Page 1 of 9 Visit Report for 07/22/2023 Allergy List Details Patient Name: Date of Service: Keith [name] 07/22/2023 8:00 A M Medical Record Number: [id number] Patient Account Number: [id number] Date of Birth/Sex: Treating RN: [birth year] (72 y.o. Dianna Limbo Primary Care Keith [name]: Keith [name] Other Clinician: Referring Keith [name]: Treating Keith [name]/Extender: Keith [name] Keith [name] Weeks in Treatment: 0 Allergies Active Allergies aspirin Severity: Moderate lisinopril Severity: Severe Allergy Notes Electronic Signature(s) Signed: 07/22/2023 4:30:49 PM By: Karie Schwalbe RN Entered By: Karie Schwalbe on 07/22/2023 05:26:51 -------------------------------------------------------------------------------- Arrival Information Details Patient Name: Date of Service: Keith [name], Keith [name]. 07/22/2023 8:00 A M Medical Record Number: [id number] Patient Account Number: [id number] Date of Birth/Sex: Treating RN: [birth year] (72 y.o. Dianna Limbo Primary Care Shawne Bulow: Keith [name] Other Clinician: Referring Keith [name]: Treating Keith [name]/Extender: Keith [name] Keith [name] Weeks in Treatment: 0 Visit Information Patient Arrived: Wheel Chair Arrival Time: 08:20 Accompanied By: spouse Transfer Assistance: Michiel Sites Lift Patient Identification Verified: Yes Patient Has Alerts: Yes Patient Alerts: Patient on Blood Thinner Eliquis Electronic Signature(s) Signed: 07/22/2023 4:30:49 PM By: Karie Schwalbe RN Entered By: Karie Schwalbe on 07/22/2023 05:26:14 Keith [name] ([id number]) [id number] Page 2 of 9 -------------------------------------------------------------------------------- Clinic Level of Care Assessment Details Patient Name: Date of Service: Keith [name] 07/22/2023 8:00 A M Medical Record Number: [id number] Patient Account Number:  [id number] Date of Birth/Sex: Treating RN: [birth year] (72 y.o. Dianna Limbo Primary Care Keith [name]: Keith [name] Other Clinician: Referring Keith [name]: Treating Keith [name]/Extender: Keith [name] Keith [name] Weeks in Treatment: 0 Clinic Level of Care Assessment Items TOOL 1 Quantity Score X- 1 0 Use when EandM and Procedure is performed on INITIAL visit ASSESSMENTS - Nursing Assessment / Reassessment X- 1 20 General Physical Exam (combine w/ comprehensive assessment (listed just below) when performed on new pt. evals) X- 1 25 Comprehensive Assessment (HX, ROS, Risk Assessments, Wounds Hx, etc.) ASSESSMENTS - Wound and Skin Assessment / Reassessment X- 1 10 Dermatologic / Skin Assessment (not related to wound area) ASSESSMENTS - Ostomy and/or Continence Assessment and Care []  - 0 Incontinence Assessment and Management []  - 0 Ostomy Care Assessment and Management (repouching, etc.) PROCESS - Coordination of Care []  - 0 Simple Patient / Family Education for ongoing care X- 1 20 Complex (extensive) Patient / Family Education for ongoing care X- 1 10 Staff obtains Chiropractor, Records, [name] Results / Process Orders est X- 1 10 Staff telephones HHA, Nursing Homes / Clarify orders / etc []  - 0 Routine Transfer to another Facility (non-emergent condition) []  - 0 Routine Hospital Admission (non-emergent condition) X- 1 15 New Admissions / Manufacturing engineer / Ordering NPWT Apligraf, etc. , []  - 0 Emergency Hospital Admission (emergent condition) PROCESS - Special Needs []  - 0 Pediatric / Minor Patient Management []  - 0 Isolation Patient Management []  - 0 Hearing / Language / Visual special needs []  - 0 Assessment of Community assistance (transportation, D/C planning, etc.) []  - 0 Additional assistance / Altered mentation X- 1 15 Support Surface(s) Assessment (bed, cushion, seat, etc.) INTERVENTIONS - Miscellaneous []  - 0 External ear exam []  - 0 Patient  Transfer (multiple staff / Nurse, adult / Similar devices) []  - 0 Simple Staple / Suture removal (25 or less) []  - 0 Complex Staple / Suture removal (26 or more) []  - 0 Hypo/Hyperglycemic Management (do not check if billed separately) []  - 0 Ankle / Brachial Index (ABI) - do not check if billed separately Has  the patient been seen at the hospital within the last three years: Yes Total Score: 125 Level Of Care: New/Established - Level 4 Electronic Signature(s) Signed: 07/22/2023 4:30:49 PM By: Karie Schwalbe RN Entered By: Karie Schwalbe on 07/22/2023 06:38:52 Keith [name] ([id number]) [id number] Page 3 of 9 -------------------------------------------------------------------------------- Encounter Discharge Information Details Patient Name: Date of Service: Keith [name] 07/22/2023 8:00 A M Medical Record Number: [id number] Patient Account Number: [id number] Date of Birth/Sex: Treating RN: [birth year] (73 y.o. Dianna Limbo Primary Care Megin Consalvo: Keith [name] Other Clinician: Referring Nhu Glasby: Treating Aarush Stukey/Extender: Keith [name] Keith [name] Weeks in Treatment: 0 Encounter Discharge Information Items Post Procedure Vitals Discharge Condition: Stable Temperature (F): 98.7 Ambulatory Status: Wheelchair Pulse (bpm): 80 Discharge Destination: Home Respiratory Rate (breaths/min): 16 Transportation: Private Auto Blood Pressure (mmHg): 95/58 Accompanied By: spouse Schedule Follow-up Appointment: Yes Clinical Summary of Care: Patient Declined Electronic Signature(s) Signed: 07/22/2023 4:30:49 PM By: Karie Schwalbe RN Entered By: Karie Schwalbe on 07/22/2023 13:06:28 -------------------------------------------------------------------------------- Lower Extremity Assessment Details Patient Name: Date of Service: Keith School [name]. 07/22/2023 8:00 A M Medical Record Number: [id number] Patient Account Number:  [id number] Date of Birth/Sex: Treating RN: [birth year] (72 y.o. Dianna Limbo Primary Care Khaniyah Bezek: Keith [name] Other Clinician: Referring Vonte Rossin: Treating Khiem Gargis/Extender: Keith [name] Keith [name] Weeks in Treatment: 0 Electronic Signature(s) Signed: 07/22/2023 4:30:49 PM By: Karie Schwalbe RN Entered By: Karie Schwalbe on 07/22/2023 05:31:35 -------------------------------------------------------------------------------- Multi Wound Chart Details Patient Name: Date of Service: Keith School [name]. 07/22/2023 8:00 A M Medical Record Number: [id number] Patient Account Number: [id number] Date of Birth/Sex: Treating RN: November 30, 1950 (72 y.o. M) Primary Care Pratham Cassatt: Keith [name] Other Clinician: Referring Jianni Batten: Treating Kayne Yuhas/Extender: Keith [name] Keith [name] Weeks in Treatment: 0 Vital Signs Height(in): 72 Pulse(bpm): 80 Weight(lbs): 242 Blood Pressure(mmHg): 95/58 Body Mass Index(BMI): 32.8 Temperature(F): 98.7 Respiratory Rate(breaths/min): [address] [name] ([id number]) [id number] Page 4 of 9 [1:Photos:] [N/A:N/A] Left Gluteus Sacrum N/A Wound Location: Pressure Injury Pressure Injury N/A Wounding Event: Pressure Ulcer Pressure Ulcer N/A Primary Etiology: Type II Diabetes, Osteoarthritis, Type II Diabetes, Osteoarthritis, N/A Comorbid History: Neuropathy Neuropathy 06/09/2023 06/24/2023 N/A Date Acquired: 0 0 N/A Weeks of Treatment: Open Open N/A Wound Status: No No N/A Wound Recurrence: 2x1.8x0.1 1x0.7x0.1 N/A Measurements L x W x D (cm) 2.827 0.55 N/A A (cm) : rea 0.283 0.055 N/A Volume (cm) : Category/Stage III Category/Stage III N/A Classification: Medium Medium N/A Exudate A mount: Serosanguineous Serosanguineous N/A Exudate Type: red, brown red, brown N/A Exudate Color: Distinct, outline attached Distinct, outline attached N/A Wound Margin: Large (67-100%) Medium (34-66%)  N/A Granulation A mount: Red Red N/A Granulation Quality: Small (1-33%) Medium (34-66%) N/A Necrotic A mount: Adherent Slough Eschar, Adherent Slough N/A Necrotic Tissue: Fascia: No Fascia: No N/A Exposed Structures: Fat Layer (Subcutaneous Tissue): No Fat Layer (Subcutaneous Tissue): No Tendon: No Tendon: No Muscle: No Muscle: No Joint: No Joint: No Bone: No Bone: No Small (1-33%) Small (1-33%) N/A Epithelialization: Debridement - Selective/Open Wound Debridement - Selective/Open Wound N/A Debridement: Pre-procedure Verification/Time Out 09:00 09:00 N/A Taken: Lidocaine 4% Topical Solution Lidocaine 4% Topical Solution N/A Pain Control: [employer] N/A Tissue Debrided: Non-Viable Tissue Non-Viable Tissue N/A Level: 2.83 0.55 N/A Debridement A (sq cm): rea Curette Curette N/A Instrument: Minimum Minimum N/A Bleeding: Pressure Pressure N/A Hemostasis A chieved: 0 0 N/A Procedural Pain: 0 0 N/A Post Procedural Pain: Procedure was tolerated well Procedure was tolerated well N/A Debridement Treatment Response: 2x1.8x0.1 1x0.7x0.1 N/A Post  9 Wounding Event: Pressure Injury Comorbid History: Type II Diabetes, Osteoarthritis, Neuropathy Date Acquired: 06/09/2023 Weeks Of Treatment: 0 Clustered Wound: No Photos Wound Measurements Length: (cm) 2 Width: (cm) 1.8 Depth: (cm) 0.1 Area: (cm) 2.827 Volume: (cm) 0.283 % Reduction in Area: % Reduction in Volume: Epithelialization: Small (1-33%) Tunneling: No Undermining: No Wound Description Classification: Category/Stage III Wound Margin: Distinct, outline attached Exudate Amount: Medium Exudate Type: Serosanguineous Exudate Color: red, brown Foul Odor After Cleansing: No Slough/Fibrino Yes Wound Bed Granulation Amount: Large (67-100%) Exposed Structure Granulation Quality: Red Fascia Exposed: No Necrotic Amount: Small (1-33%) Fat Layer (Subcutaneous Tissue) Exposed: No Necrotic Quality: Adherent Slough Tendon Exposed: No Muscle Exposed: No Joint Exposed: No Bone Exposed: No Periwound Skin Texture Texture Color No Abnormalities Noted: No No Abnormalities Noted: Yes Scarring: Yes Temperature / Pain Temperature: No Abnormality Moisture No Abnormalities Noted: Yes Treatment Notes Wound #1 (Gluteus) Wound Laterality: Left Cleanser Peri-Wound Care Topical Primary Dressing Maxorb Extra Ag+ Alginate Dressing, 2x2 (in/in) Discharge Instruction: Apply to wound bed as instructed Secondary Dressing ALLEVYN Gentle Border, 3x3 (in/in) Discharge Instruction: Apply over primary dressing as directed. Secured With Compression Wrap Compression Stockings Add-Ons Keith [name], Keith [name] ([id number]) [id number] Page 8 of 9 Electronic Signature(s) Signed: 07/22/2023 4:30:49 PM By: Karie Schwalbe RN Entered By: Karie Schwalbe on 07/22/2023 06:02:50 -------------------------------------------------------------------------------- Wound Assessment Details Patient Name: Date of  Service: Keith School [name]. 07/22/2023 8:00 A M Medical Record Number: [id number] Patient Account Number: [id number] Date of Birth/Sex: Treating RN: 1951-05-27 (72 y.o. Dianna Limbo Primary Care Keyston Ardolino: Keith [name] Other Clinician: Referring Latarshia Jersey: Treating Jadelin Eng/Extender: Keith [name] Keith [name] Weeks in Treatment: 0 Wound Status Wound Number: 2 Primary Etiology: Pressure Ulcer Wound Location: Sacrum Wound Status: Open Wounding Event: Pressure Injury Comorbid History: Type II Diabetes, Osteoarthritis, Neuropathy Date Acquired: 06/24/2023 Weeks Of Treatment: 0 Clustered Wound: No Photos Wound Measurements Length: (cm) 1 Width: (cm) 0.7 Depth: (cm) 0.1 Area: (cm) 0.55 Volume: (cm) 0.055 % Reduction in Area: % Reduction in Volume: Epithelialization: Small (1-33%) Tunneling: No Undermining: No Wound Description Classification: Category/Stage III Wound Margin: Distinct, outline attached Exudate Amount: Medium Exudate Type: Serosanguineous Exudate Color: red, brown Foul Odor After Cleansing: No Slough/Fibrino Yes Wound Bed Granulation Amount: Medium (34-66%) Exposed Structure Granulation Quality: Red Fascia Exposed: No Necrotic Amount: Medium (34-66%) Fat Layer (Subcutaneous Tissue) Exposed: No Necrotic Quality: Eschar, Adherent Slough Tendon Exposed: No Muscle Exposed: No Joint Exposed: No Bone Exposed: No Periwound Skin Texture Texture Color No Abnormalities Noted: Yes No Abnormalities Noted: Yes Moisture Temperature / Pain No Abnormalities Noted: Yes Temperature: No Abnormality Keith [name], Keith [name] ([id number]) [id number] Page 9 of 9 Treatment Notes Wound #2 (Sacrum) Cleanser Peri-Wound Care Topical Primary Dressing Maxorb Extra Ag+ Alginate Dressing, 2x2 (in/in) Discharge Instruction: Apply to wound bed as instructed Secondary Dressing ALLEVYN Gentle Border, 3x3 (in/in) Discharge Instruction: Apply over  primary dressing as directed. Secured With Compression Wrap Compression Stockings Facilities manager) Signed: 07/22/2023 4:30:49 PM By: Karie Schwalbe RN Entered By: Karie Schwalbe on 07/22/2023 06:03:13 -------------------------------------------------------------------------------- Vitals Details Patient Name: Date of Service: Keith [name], Keith [name]. 07/22/2023 8:00 A M Medical Record Number: [id number] Patient Account Number: [id number] Date of Birth/Sex: Treating RN: 1951/04/12 (72 y.o. Dianna Limbo Primary Care Dollie Bressi: Keith [name] Other Clinician: Referring Edeline Greening: Treating Neftali Abair/Extender: Keith [name] Keith [name] Weeks in Treatment: 0 Vital Signs Time Taken: 08:25 Temperature (F): 98.7 Height (in): 72 Pulse (bpm): 80 Weight (lbs): 242 Respiratory Rate (breaths/min): 16 Body Mass Index (BMI):

## 2023-07-22 NOTE — Progress Notes (Signed)
Keith Sale T ([id number]) [id number] Page 1 of 10 Visit Report for 07/22/2023 Chief Complaint Document Details Patient Name: Date of Service: Keith [name] 07/22/2023 8:00 A M Medical Record Number: [id number] Patient Account Number: [id number] Date of Birth/Sex: Treating RN: 1950-11-23 (72 y.o. M) Primary Care Provider: [place], Betty Other Clinician: Referring Provider: Treating Provider/Extender: Juliene Kirsh [place], Betty Weeks in Treatment: 0 Information Obtained from: Patient Chief Complaint Patient is at the clinic for treatment of open pressure ulcers Electronic Signature(s) Signed: 07/22/2023 9:14:01 AM By: Duanne Guess MD FACS Entered By: Duanne Guess on 07/22/2023 06:14:01 -------------------------------------------------------------------------------- Debridement Details Patient Name: Date of Service: Keith [name], Keith Oppenheim T. 07/22/2023 8:00 A M Medical Record Number: [id number] Patient Account Number: [id number] Date of Birth/Sex: Treating RN: [birth year] (72 y.o. Dianna Limbo Primary Care Provider: [place], Betty Other Clinician: Referring Provider: Treating Provider/Extender: Keith [name] [place], Betty Weeks in Treatment: 0 Debridement Performed for Assessment: Wound #1 Left Gluteus Performed By: Physician Duanne Guess, MD The following information was scribed by: Karie Schwalbe The information was scribed for: Duanne Guess Debridement Type: Debridement Level of Consciousness (Pre-procedure): Awake and Alert Pre-procedure Verification/Time Out Yes - 09:00 Taken: Start Time: 09:00 Pain Control: Lidocaine 4% T opical Solution Percent of Wound Bed Debrided: 100% T Area Debrided (cm): otal 2.83 Tissue and other material debrided: Non-Viable, Slough, Slough Level: Non-Viable Tissue Debridement Description: Selective/Open Wound Instrument: Curette Bleeding: Minimum Hemostasis Achieved:  Pressure End Time: 09:03 Procedural Pain: 0 Post Procedural Pain: 0 Response to Treatment: Procedure was tolerated well Level of Consciousness (Post- Awake and Alert procedure): Post Debridement Measurements of Total Wound Length: (cm) 2 Stage: Category/Stage III Width: (cm) 1.8 Depth: (cm) 0.1 Volume: (cm) 0.283 Keith Sale T ([id number]) [id number] Page 2 of 10 Character of Wound/Ulcer Post Debridement: Improved Post Procedure Diagnosis Same as Pre-procedure Electronic Signature(s) Signed: 07/22/2023 10:18:19 AM By: Duanne Guess MD FACS Signed: 07/22/2023 4:30:49 PM By: Karie Schwalbe RN Entered By: Karie Schwalbe on 07/22/2023 06:07:17 -------------------------------------------------------------------------------- Debridement Details Patient Name: Date of Service: Keith School T. 07/22/2023 8:00 A M Medical Record Number: [id number] Patient Account Number: [id number] Date of Birth/Sex: Treating RN: May 05, 1951 (72 y.o. Dianna Limbo Primary Care Provider: [place], Betty Other Clinician: Referring Provider: Treating Provider/Extender: Alison Kubicki [place], Betty Weeks in Treatment: 0 Debridement Performed for Assessment: Wound #2 Sacrum Performed By: Physician Duanne Guess, MD The following information was scribed by: Karie Schwalbe The information was scribed for: Duanne Guess Debridement Type: Debridement Level of Consciousness (Pre-procedure): Awake and Alert Pre-procedure Verification/Time Out Yes - 09:00 Taken: Start Time: 09:00 Pain Control: Lidocaine 4% T opical Solution Percent of Wound Bed Debrided: 100% T Area Debrided (cm): otal 0.55 Tissue and other material debrided: Non-Viable, Slough, Slough Level: Non-Viable Tissue Debridement Description: Selective/Open Wound Instrument: Curette Bleeding: Minimum Hemostasis Achieved: Pressure End Time: 09:03 Procedural Pain: 0 Post Procedural Pain:  0 Response to Treatment: Procedure was tolerated well Level of Consciousness (Post- Awake and Alert procedure): Post Debridement Measurements of Total Wound Length: (cm) 1 Stage: Category/Stage III Width: (cm) 0.7 Depth: (cm) 0.1 Volume: (cm) 0.055 Character of Wound/Ulcer Post Debridement: Improved Post Procedure Diagnosis Same as Pre-procedure Electronic Signature(s) Signed: 07/22/2023 10:18:19 AM By: Duanne Guess MD FACS Signed: 07/22/2023 4:30:49 PM By: Karie Schwalbe RN Entered By: Karie Schwalbe on 07/22/2023 06:07:59 Keith [name] ([id number]) [id number] Page 3 of 10 -------------------------------------------------------------------------------- HPI Details Patient Name: Date of Service: Keith School T. 07/22/2023 8:00 A M  conducted at 09:00, prior to the start of the procedure. A Minimum amount of bleeding was controlled with Pressure. The procedure was  tolerated well with a pain level of 0 throughout and a pain level of 0 following the procedure. Post Debridement Measurements: 2cm length x 1.8cm width x 0.1cm depth; 0.283cm^3 volume. Post debridement Stage noted as Category/Stage III. Character of Wound/Ulcer Post Debridement is improved. Post procedure Diagnosis Wound #1: Same as Pre-Procedure Wound #2 Pre-procedure diagnosis of Wound #2 is a Pressure Ulcer located on the Sacrum . There was a Selective/Open Wound Non-Viable Tissue Debridement with a total area of 0.55 sq cm performed by Duanne Guess, MD. With the following instrument(s): Curette to remove Non-Viable tissue/material. Material removed includes [facility] after achieving pain control using Lidocaine 4% Topical Solution. No specimens were taken. A time out was conducted at 09:00, prior to the start of the procedure. A Minimum amount of bleeding was controlled with Pressure. The procedure was tolerated well with a pain level of 0 throughout and a pain level of 0 following the procedure. Post Debridement Measurements: 1cm length x 0.7cm width x 0.1cm depth; 0.055cm^3 volume. Post debridement Stage noted as Category/Stage III. Character of Wound/Ulcer Post Debridement is improved. Post procedure Diagnosis Wound #2: Same as Pre-Procedure Plan Follow-up Appointments: Return Appointment in 1 week. - **** Hoyer*******Dr. Lady Gary Anesthetic: (In clinic) Topical Lidocaine 4% applied to wound bed Bathing/ Shower/ Hygiene: May shower and wash wound with soap and water. Off-Loading: Other: - Will order Roho Cushion. Keep shift weight. Turn in bed at least every 2 hours Additional Orders / Instructions: Follow Nutritious Diet Other: - Goal is 70-100g protein per day WOUND #1: - Gluteus Wound Laterality: Left Prim Dressing: Maxorb Extra Ag+ Alginate Dressing, 2x2 (in/in) 1 x Per Day/30 Days ary Discharge Instructions: Apply to wound bed as instructed Keith [name], Keith T ([id number])  [id number] Page 8 of 10 Secondary Dressing: ALLEVYN Gentle Border, 3x3 (in/in) 1 x Per Day/30 Days Discharge Instructions: Apply over primary dressing as directed. WOUND #2: - Sacrum Wound Laterality: Prim Dressing: Maxorb Extra Ag+ Alginate Dressing, 2x2 (in/in) 1 x Per Day/30 Days ary Discharge Instructions: Apply to wound bed as instructed Secondary Dressing: ALLEVYN Gentle Border, 3x3 (in/in) 1 x Per Day/30 Days Discharge Instructions: Apply over primary dressing as directed. 07/22/2023: This is a 72 year old nonambulatory man with well-controlled diabetes who presents with 2 pressure ulcers. On his sacrum, there is a small linear ulcer, stage III, with some light slough on the surface. On his left ischium, there is a similar ulcer, also stage III. The patient's wife showed [place] a photograph of what it looked like a few weeks ago and there has been fairly significant improvement. I used a curette to debride slough off of both surfaces. We will apply silver alginate and foam border dressings to each site. I discussed the importance of offloading adequately, shifting his weight frequently and turning from side-to-side. We also discussed the importance of adequate protein intake for wound healing plan I recommended that he get 70 to 100 g of protein daily. We are also going to order him a Roho cushion for his wheelchair. Follow-up in 1 week. Electronic Signature(s) Signed: 07/22/2023 9:18:34 AM By: Duanne Guess MD FACS Entered By: Duanne Guess on 07/22/2023 06:18:34 -------------------------------------------------------------------------------- HxROS Details Patient Name: Date of Service: Keith [name], Keith Oppenheim T. 07/22/2023 8:00 A M Medical Record Number: [id number] Patient Account Number: [id number] Date of Birth/Sex: Treating RN: November 03, 1950 (72 y.o. Dianna Limbo Primary Care  Provider: [place], Betty Other Clinician: Referring Provider: Treating  Provider/Extender: Vashti Bolanos [place], Betty Weeks in Treatment: 0 Information Obtained From Patient Chart Integumentary (Skin) Complaints and Symptoms: Positive for: Wounds - pressure injury of Left buttock stage 2 Respiratory Medical History: Past Medical History Notes: Hx: Community Aquired Pneumonia; Loculated Pleural Effusion; Chronic Respiratory Failure with Hypoxia Gastrointestinal Medical History: Past Medical History Notes: Hx: Drug induced Constipation;GERD Endocrine Medical History: Positive for: Type II Diabetes - pt. states he is not Diabetic Past Medical History Notes: Last Hem A1C: 5.1% (07/01/23) Genitourinary Medical History: Past Medical History Notes: Chronic Kidney Disease Stage III Musculoskeletal Medical History: Positive for: Osteoarthritis AMBERS, IYENGAR ([id number]) [id number] Page 9 of 10 Past Medical History Notes: Hx: Closed Right Trimalleolar Fracture;Closed Right Ankle Fracture; Bilateral Leg weakness Neurologic Medical History: Positive for: Neuropathy - Diabetic Peripheral Neuropathy Past Medical History Notes: Hx: Spinal Cord Compression;Thoracic Myelopathy Immunizations Pneumococcal Vaccine: Received Pneumococcal Vaccination: Yes Received Pneumococcal Vaccination On or After 60th Birthday: Yes Implantable Devices No devices added Family and Social History Unknown History: Yes; Former smoker - ended on 10/08/1993; Marital Status - Married; Alcohol Use: Rarely; Drug Use: No History; Caffeine Use: Moderate; Financial Concerns: No; Food, Clothing or Shelter Needs: No; Support System Lacking: No; Transportation Concerns: No Electronic Signature(s) Signed: 07/22/2023 10:18:19 AM By: Duanne Guess MD FACS Signed: 07/22/2023 4:30:49 PM By: Karie Schwalbe RN Entered By: Karie Schwalbe on 07/22/2023 05:28:26 -------------------------------------------------------------------------------- SuperBill  Details Patient Name: Date of Service: Keith School T. 07/22/2023 Medical Record Number: [id number] Patient Account Number: [id number] Date of Birth/Sex: Treating RN: April 23, 1951 (72 y.o. M) Primary Care Provider: [place], Betty Other Clinician: Referring Provider: Treating Provider/Extender: Elcie Pelster [place], Betty Weeks in Treatment: 0 Diagnosis Coding ICD-10 Codes Code Description [phone] Pressure ulcer of left buttock, stage 3 L89.153 Pressure ulcer of sacral region, stage 3 E11.622 Type 2 diabetes mellitus with other skin ulcer G95.20 Unspecified cord compression N18.30 Chronic kidney disease, stage 3 unspecified Facility Procedures : CPT4 Code: [id number] Description: 99214 - WOUND CARE VISIT-LEV 4 EST PT Modifier: Quantity: 1 : CPT4 Code: 63875643 Description: 97597 - DEBRIDE WOUND 1ST 20 SQ CM OR < ICD-10 Diagnosis Description L89.323 Pressure ulcer of left buttock, stage 3 L89.153 Pressure ulcer of sacral region, stage 3 Modifier: Quantity: 1 Physician Procedures : CPT4 Code Description Modifier [id number] - WC PHYS LEVEL 4 - NEW PT 25 ICD-10 Diagnosis Description L89.323 Pressure ulcer of left buttock, stage 3 L89.153 Pressure ulcer of sacral region, stage 3 E11.622 Type 2 diabetes mellitus with other skin  ulcer Keith [name], Keith T ([id number]) [id number] G95.20 Unspecified cord compression Quantity: 1 f Page 10 of 10 : [id number] 97597 - WC PHYS DEBR WO ANESTH 20 SQ CM 1 ICD-10 Diagnosis Description L89.323 Pressure ulcer of left buttock, stage 3 L89.153 Pressure ulcer of sacral region, stage 3 Quantity: Electronic Signature(s) Signed: 07/22/2023 10:18:19 AM By: Duanne Guess MD FACS Signed: 07/22/2023 4:30:49 PM By: Karie Schwalbe RN Previous Signature: 07/22/2023 9:19:51 AM Version By: Duanne Guess MD FACS Entered By: Karie Schwalbe on 07/22/2023 06:39:05  Keith Sale T ([id number]) [id number] Page 1 of 10 Visit Report for 07/22/2023 Chief Complaint Document Details Patient Name: Date of Service: Keith [name] 07/22/2023 8:00 A M Medical Record Number: [id number] Patient Account Number: [id number] Date of Birth/Sex: Treating RN: 1950-11-23 (72 y.o. M) Primary Care Provider: [place], Betty Other Clinician: Referring Provider: Treating Provider/Extender: Juliene Kirsh [place], Betty Weeks in Treatment: 0 Information Obtained from: Patient Chief Complaint Patient is at the clinic for treatment of open pressure ulcers Electronic Signature(s) Signed: 07/22/2023 9:14:01 AM By: Duanne Guess MD FACS Entered By: Duanne Guess on 07/22/2023 06:14:01 -------------------------------------------------------------------------------- Debridement Details Patient Name: Date of Service: Keith [name], Keith Oppenheim T. 07/22/2023 8:00 A M Medical Record Number: [id number] Patient Account Number: [id number] Date of Birth/Sex: Treating RN: [birth year] (72 y.o. Dianna Limbo Primary Care Provider: [place], Betty Other Clinician: Referring Provider: Treating Provider/Extender: Keith [name] [place], Betty Weeks in Treatment: 0 Debridement Performed for Assessment: Wound #1 Left Gluteus Performed By: Physician Duanne Guess, MD The following information was scribed by: Karie Schwalbe The information was scribed for: Duanne Guess Debridement Type: Debridement Level of Consciousness (Pre-procedure): Awake and Alert Pre-procedure Verification/Time Out Yes - 09:00 Taken: Start Time: 09:00 Pain Control: Lidocaine 4% T opical Solution Percent of Wound Bed Debrided: 100% T Area Debrided (cm): otal 2.83 Tissue and other material debrided: Non-Viable, Slough, Slough Level: Non-Viable Tissue Debridement Description: Selective/Open Wound Instrument: Curette Bleeding: Minimum Hemostasis Achieved:  Pressure End Time: 09:03 Procedural Pain: 0 Post Procedural Pain: 0 Response to Treatment: Procedure was tolerated well Level of Consciousness (Post- Awake and Alert procedure): Post Debridement Measurements of Total Wound Length: (cm) 2 Stage: Category/Stage III Width: (cm) 1.8 Depth: (cm) 0.1 Volume: (cm) 0.283 Keith Sale T ([id number]) [id number] Page 2 of 10 Character of Wound/Ulcer Post Debridement: Improved Post Procedure Diagnosis Same as Pre-procedure Electronic Signature(s) Signed: 07/22/2023 10:18:19 AM By: Duanne Guess MD FACS Signed: 07/22/2023 4:30:49 PM By: Karie Schwalbe RN Entered By: Karie Schwalbe on 07/22/2023 06:07:17 -------------------------------------------------------------------------------- Debridement Details Patient Name: Date of Service: Keith School T. 07/22/2023 8:00 A M Medical Record Number: [id number] Patient Account Number: [id number] Date of Birth/Sex: Treating RN: May 05, 1951 (72 y.o. Dianna Limbo Primary Care Provider: [place], Betty Other Clinician: Referring Provider: Treating Provider/Extender: Alison Kubicki [place], Betty Weeks in Treatment: 0 Debridement Performed for Assessment: Wound #2 Sacrum Performed By: Physician Duanne Guess, MD The following information was scribed by: Karie Schwalbe The information was scribed for: Duanne Guess Debridement Type: Debridement Level of Consciousness (Pre-procedure): Awake and Alert Pre-procedure Verification/Time Out Yes - 09:00 Taken: Start Time: 09:00 Pain Control: Lidocaine 4% T opical Solution Percent of Wound Bed Debrided: 100% T Area Debrided (cm): otal 0.55 Tissue and other material debrided: Non-Viable, Slough, Slough Level: Non-Viable Tissue Debridement Description: Selective/Open Wound Instrument: Curette Bleeding: Minimum Hemostasis Achieved: Pressure End Time: 09:03 Procedural Pain: 0 Post Procedural Pain:  0 Response to Treatment: Procedure was tolerated well Level of Consciousness (Post- Awake and Alert procedure): Post Debridement Measurements of Total Wound Length: (cm) 1 Stage: Category/Stage III Width: (cm) 0.7 Depth: (cm) 0.1 Volume: (cm) 0.055 Character of Wound/Ulcer Post Debridement: Improved Post Procedure Diagnosis Same as Pre-procedure Electronic Signature(s) Signed: 07/22/2023 10:18:19 AM By: Duanne Guess MD FACS Signed: 07/22/2023 4:30:49 PM By: Karie Schwalbe RN Entered By: Karie Schwalbe on 07/22/2023 06:07:59 Keith [name] ([id number]) [id number] Page 3 of 10 -------------------------------------------------------------------------------- HPI Details Patient Name: Date of Service: Keith School T. 07/22/2023 8:00 A M  Medical Record Number: [id number] Patient Account Number: [id number] Date of Birth/Sex: Treating RN: 08-Jul-1951 (72 y.o. M) Primary Care Provider: [place], Betty Other Clinician: Referring Provider: Treating Provider/Extender: Keith [name] [place], Betty Weeks in Treatment: 0 History of Present Illness HPI Description: ADMISSION 07/22/2023 This is a 72 year old type II diabetic (last hemoglobin A1c 5.5% in March 2024). He has spinal stenosis with cord compression and thoracic myelopathy. He has CKD stage III. He is nonambulatory and spends most of his time in either wheelchair or in bed. He recently saw his primary care provider at the end of September and was noted to have a pressure injury of his left buttock. The provider was unable to directly evaluated because he cannot stand long enough for an examination but his wife provided a picture and based on that photograph, it was assessed as a stage II measuring 1 to 1.5 inches. He was advised to increase his protein intake, avoid pressure on the affected area, apply Desitin, and was referred to the wound care center for further evaluation  and management. Electronic Signature(s) Signed: 07/22/2023 9:14:10 AM By: Duanne Guess MD FACS Previous Signature: 07/22/2023 8:28:13 AM Version By: Duanne Guess MD FACS Entered By: Duanne Guess on 07/22/2023 06:14:10 -------------------------------------------------------------------------------- Physical Exam Details Patient Name: Date of Service: Keith School T. 07/22/2023 8:00 A M Medical Record Number: [id number] Patient Account Number: [id number] Date of Birth/Sex: Treating RN: [birth year] (72 y.o. M) Primary Care Provider: [place], Betty Other Clinician: Referring Provider: Treating Provider/Extender: Keith [name] [place], Betty Weeks in Treatment: 0 Constitutional Hypotensive, asymptomatic. . . . No acute distress. Respiratory Normal work of breathing on room air. Notes 07/22/2023: On his sacrum, there is a small linear ulcer, stage III, with some light slough on the surface. On his left ischium, there is a similar ulcer, also stage III. The patient's wife showed [place] a photograph of what it looked like a few weeks ago and there has been fairly significant improvement. Electronic Signature(s) Signed: 07/22/2023 9:16:01 AM By: Duanne Guess MD FACS Entered By: Duanne Guess on 07/22/2023 06:16:01 -------------------------------------------------------------------------------- Physician Orders Details Patient Name: Date of Service: Keith [name], Keith Oppenheim T. 07/22/2023 8:00 A M Medical Record Number: [id number] Patient Account Number: [id number] Date of Birth/Sex: Treating RN: 1951/01/20 (72 y.o. Dianna Limbo Primary Care Provider: [place], Betty Other Clinician: Referring Provider: Treating Provider/Extender: Keith [name] [place], Betty Weeks in Treatment: 0 Verbal / Phone Orders: Keith [name] ([id number]) [id number] Page 4 of 10 Diagnosis Coding ICD-10 Coding Code Description L89.329 Pressure ulcer of  left buttock, unspecified stage E11.622 Type 2 diabetes mellitus with other skin ulcer G95.20 Unspecified cord compression N18.30 Chronic kidney disease, stage 3 unspecified Follow-up Appointments ppointment in 1 week. - **** Hoyer*******Dr. Lady Gary Return A Anesthetic (In clinic) Topical Lidocaine 4% applied to wound bed Bathing/ Shower/ Hygiene May shower and wash wound with soap and water. Off-Loading Other: - Will order Roho Cushion. While in wheelchair, keep shifting weight . Turn in bed at least every 2 hours Additional Orders / Instructions Follow Nutritious Diet Other: - Goal is 70-100g protein per day Wound Treatment Wound #1 - Gluteus Wound Laterality: Left Prim Dressing: Maxorb Extra Ag+ Alginate Dressing, 2x2 (in/in) (DME) (Generic) 1 x Per Day/30 Days ary Discharge Instructions: Apply to wound bed as instructed Secondary Dressing: ALLEVYN Gentle Border, 3x3 (in/in) (DME) (Generic) 1 x Per Day/30 Days Discharge Instructions: Apply over primary dressing as directed. Wound #2 - Sacrum Prim Dressing: Maxorb Extra Ag+ Alginate Dressing, 2x2 (  Medical Record Number: [id number] Patient Account Number: [id number] Date of Birth/Sex: Treating RN: 08-Jul-1951 (72 y.o. M) Primary Care Provider: [place], Betty Other Clinician: Referring Provider: Treating Provider/Extender: Keith [name] [place], Betty Weeks in Treatment: 0 History of Present Illness HPI Description: ADMISSION 07/22/2023 This is a 72 year old type II diabetic (last hemoglobin A1c 5.5% in March 2024). He has spinal stenosis with cord compression and thoracic myelopathy. He has CKD stage III. He is nonambulatory and spends most of his time in either wheelchair or in bed. He recently saw his primary care provider at the end of September and was noted to have a pressure injury of his left buttock. The provider was unable to directly evaluated because he cannot stand long enough for an examination but his wife provided a picture and based on that photograph, it was assessed as a stage II measuring 1 to 1.5 inches. He was advised to increase his protein intake, avoid pressure on the affected area, apply Desitin, and was referred to the wound care center for further evaluation  and management. Electronic Signature(s) Signed: 07/22/2023 9:14:10 AM By: Duanne Guess MD FACS Previous Signature: 07/22/2023 8:28:13 AM Version By: Duanne Guess MD FACS Entered By: Duanne Guess on 07/22/2023 06:14:10 -------------------------------------------------------------------------------- Physical Exam Details Patient Name: Date of Service: Keith School T. 07/22/2023 8:00 A M Medical Record Number: [id number] Patient Account Number: [id number] Date of Birth/Sex: Treating RN: [birth year] (72 y.o. M) Primary Care Provider: [place], Betty Other Clinician: Referring Provider: Treating Provider/Extender: Keith [name] [place], Betty Weeks in Treatment: 0 Constitutional Hypotensive, asymptomatic. . . . No acute distress. Respiratory Normal work of breathing on room air. Notes 07/22/2023: On his sacrum, there is a small linear ulcer, stage III, with some light slough on the surface. On his left ischium, there is a similar ulcer, also stage III. The patient's wife showed [place] a photograph of what it looked like a few weeks ago and there has been fairly significant improvement. Electronic Signature(s) Signed: 07/22/2023 9:16:01 AM By: Duanne Guess MD FACS Entered By: Duanne Guess on 07/22/2023 06:16:01 -------------------------------------------------------------------------------- Physician Orders Details Patient Name: Date of Service: Keith [name], Keith Oppenheim T. 07/22/2023 8:00 A M Medical Record Number: [id number] Patient Account Number: [id number] Date of Birth/Sex: Treating RN: 1951/01/20 (72 y.o. Dianna Limbo Primary Care Provider: [place], Betty Other Clinician: Referring Provider: Treating Provider/Extender: Keith [name] [place], Betty Weeks in Treatment: 0 Verbal / Phone Orders: Keith [name] ([id number]) [id number] Page 4 of 10 Diagnosis Coding ICD-10 Coding Code Description L89.329 Pressure ulcer of  left buttock, unspecified stage E11.622 Type 2 diabetes mellitus with other skin ulcer G95.20 Unspecified cord compression N18.30 Chronic kidney disease, stage 3 unspecified Follow-up Appointments ppointment in 1 week. - **** Hoyer*******Dr. Lady Gary Return A Anesthetic (In clinic) Topical Lidocaine 4% applied to wound bed Bathing/ Shower/ Hygiene May shower and wash wound with soap and water. Off-Loading Other: - Will order Roho Cushion. While in wheelchair, keep shifting weight . Turn in bed at least every 2 hours Additional Orders / Instructions Follow Nutritious Diet Other: - Goal is 70-100g protein per day Wound Treatment Wound #1 - Gluteus Wound Laterality: Left Prim Dressing: Maxorb Extra Ag+ Alginate Dressing, 2x2 (in/in) (DME) (Generic) 1 x Per Day/30 Days ary Discharge Instructions: Apply to wound bed as instructed Secondary Dressing: ALLEVYN Gentle Border, 3x3 (in/in) (DME) (Generic) 1 x Per Day/30 Days Discharge Instructions: Apply over primary dressing as directed. Wound #2 - Sacrum Prim Dressing: Maxorb Extra Ag+ Alginate Dressing, 2x2 (

## 2023-07-22 NOTE — Progress Notes (Signed)
2 or more medical diagnoseso) 0 No Ambulatory aid None/bed rest/wheelchair/nurse 0 Yes Crutches/cane/walker 0 No Furniture 0 No Intravenous therapy Access/Saline/Heparin Lock 0 No Gait/Transferring Normal/ bed rest/ wheelchair 0 No Weak (short steps with or without shuffle, stooped but able to lift head while walking, may seek 0 No support from furniture) Impaired (short steps with shuffle, may have difficulty arising from chair, head down, impaired 20 Yes balance) Mental Status Oriented to own ability 0 No Electronic Signature(s) Signed: 07/22/2023 4:30:49 PM By: Karie Schwalbe RN Entered By: Karie Schwalbe on 07/22/2023 05:30:43 -------------------------------------------------------------------------------- Foot Assessment Details Patient Name: Date of Service: [name] 07/22/2023 8:00 A M Medical Record Number: [id number] Patient Account Number: [id number] Date of Birth/Sex: Treating RN: Oct 25, 1950 (72 y.o. Dianna Limbo Primary Care Neeley Sedivy: [place], Betty Other Clinician: Referring Klara Stjames: Treating Aveena Bari/Extender: Cannon, Jennifer [place], Betty Weeks in Treatment: 0 Foot Assessment Items Site Locations + = Sensation present, - = Sensation absent, C = Callus, U = Ulcer R = Redness, W = Warmth, M = Maceration, PU =  Pre-ulcerative lesion F = Fissure, S = Swelling, D = Dryness Assessment Right: Left: Other Deformity: No No Prior Foot Ulcer: No No Prior Amputation: No No Charcot Joint: No No Ambulatory Status: Ambulatory With Help Assistance Device: Wheelchair HAILEY, MILES T ([id number]) [phone] [id number] Page 4 of 4 Gait: Surveyor, mining) Signed: 07/22/2023 4:30:49 PM By: Karie Schwalbe RN Entered By: Karie Schwalbe on 07/22/2023 05:31:26 -------------------------------------------------------------------------------- Nutrition Risk Screening Details Patient Name: Date of Service: Lorn Junes 07/22/2023 8:00 A M Medical Record Number: [id number] Patient Account Number: [id number] Date of Birth/Sex: Treating RN: [birth year] (72 y.o. Dianna Limbo Primary Care Daune Colgate: [place], Betty Other Clinician: Referring Laurian Edrington: Treating Celestina Gironda/Extender: Cannon, Jennifer [place], Betty Weeks in Treatment: 0 Height (in): 72 Weight (lbs): 242 Body Mass Index (BMI): 32.8 Nutrition Risk Screening Items Score Screening NUTRITION RISK SCREEN: I have an illness or condition that made me change the kind and/or amount of food I eat 0 No I eat fewer than two meals per day 3 Yes I eat few fruits and vegetables, or milk products 0 No I have three or more drinks of beer, liquor or wine almost every day 0 No I have tooth or mouth problems that make it hard for me to eat 0 No I don't always have enough money to buy the food I need 0 No I eat alone most of the time 0 No I take three or more different prescribed or over-the-counter drugs a day 1 Yes Without wanting to, I have lost or gained 10 pounds in the last six months 0 No I am not always physically able to shop, cook and/or feed myself 0 No Nutrition Protocols Good Risk Protocol Moderate Risk Protocol 0 Provide education on nutrition High Risk Proctocol Risk Level: Moderate Risk Score:  4 Electronic Signature(s) Signed: 07/22/2023 4:30:49 PM By: Karie Schwalbe RN Entered By: Karie Schwalbe on 07/22/2023 05:31:10  BOSTON, COOKSON T ([id number]) [phone] [id number] Page 1 of 4 Visit Report for 07/22/2023 Abuse Risk Screen Details Patient Name: Date of Service: Lorn Junes 07/22/2023 8:00 A M Medical Record Number: [id number] Patient Account Number: [id number] Date of Birth/Sex: Treating RN: March 22, 1951 (72 y.o. Dianna Limbo Primary Care Chassity Ludke: [place], Betty Other Clinician: Referring Fujiko Picazo: Treating Dara Camargo/Extender: Cannon, Jennifer [place], Betty Weeks in Treatment: 0 Abuse Risk Screen Items Answer ABUSE RISK SCREEN: Has anyone close to you tried to hurt or harm you recentlyo No Do you feel uncomfortable with anyone in your familyo No Has anyone forced you do things that you didnt want to doo No Electronic Signature(s) Signed: 07/22/2023 4:30:49 PM By: Karie Schwalbe RN Entered By: Karie Schwalbe on 07/22/2023 05:28:33 -------------------------------------------------------------------------------- Activities of Daily Living Details Patient Name: Date of Service: Lorn Junes 07/22/2023 8:00 A M Medical Record Number: [id number] Patient Account Number: [id number] Date of Birth/Sex: Treating RN: [birth year] (72 y.o. Dianna Limbo Primary Care Shaquita Fort: [place], Betty Other Clinician: Referring Cheyane Ayon: Treating Kayli Beal/Extender: Cannon, Jennifer [place], Betty Weeks in Treatment: 0 Activities of Daily Living Items Answer Activities of Daily Living (Please select one for each item) Drive Automobile Not Able T Medications ake Completely Able Use T elephone Completely Able Care for Appearance Completely Able Use T oilet Completely Able Bath / Shower Need Assistance Dress Self Completely Able Feed Self Completely Able Walk Need Assistance Get In / Out Bed Need Assistance Housework Need Assistance Prepare Meals Need Assistance Handle Money Completely Able Shop for Self Need Assistance Electronic Signature(s) Signed:  07/22/2023 4:30:49 PM By: Karie Schwalbe RN Entered By: Karie Schwalbe on 07/22/2023 05:29:40 [name] ([id number]) [id number] Page 2 of 4 -------------------------------------------------------------------------------- Education Screening Details Patient Name: Date of Service: Lorn Junes 07/22/2023 8:00 A M Medical Record Number: [id number] Patient Account Number: [id number] Date of Birth/Sex: Treating RN: [birth year] (72 y.o. Dianna Limbo Primary Care Jackston [place]: [place], Betty Other Clinician: Referring Maureen Duesing: Treating Glennda Weatherholtz/Extender: Cannon, Jennifer [place], Betty Weeks in Treatment: 0 Primary Learner Assessed: Patient Learning Preferences/Education Level/Primary Language Learning Preference: Explanation, Demonstration, Printed Material Highest Education Level: College or Above Preferred Language: English Cognitive Barrier Language Barrier: No Translator Needed: No Memory Deficit: No Emotional Barrier: No Cultural/Religious Beliefs Affecting Medical Care: No Physical Barrier Impaired Vision: No Impaired Hearing: No Decreased Hand dexterity: No Knowledge/Comprehension Knowledge Level: High Comprehension Level: High Ability to understand written instructions: High Ability to understand verbal instructions: High Motivation Anxiety Level: Calm Cooperation: Cooperative Education Importance: Acknowledges Need Interest in Health Problems: Asks Questions Perception: Coherent Willingness to Engage in Self-Management High Activities: Readiness to Engage in Self-Management High Activities: Electronic Signature(s) Signed: 07/22/2023 4:30:49 PM By: Karie Schwalbe RN Entered By: Karie Schwalbe on 07/22/2023 05:30:10 -------------------------------------------------------------------------------- Fall Risk Assessment Details Patient Name: Date of Service: [name], [name] 07/22/2023 8:00 A M Medical Record  Number: [id number] Patient Account Number: [id number] Date of Birth/Sex: Treating RN: 01/30/1951 (72 y.o. Dianna Limbo Primary Care Bonna Steury: [place], Betty Other Clinician: Referring Shneur [place]: Treating Abdoulaye Drum/Extender: Cannon, Jennifer [place], Betty Weeks in Treatment: 0 Fall Risk Assessment Items Have you had 2 or more falls in the last [address] JOSEL, KEO T ([id number]) [phone] [id number] Page 3 of 4 Have you had any fall that resulted in injury in the last 12 monthso 0 No FALLS RISK SCREEN History of falling - immediate or within 3 months 0 No Secondary diagnosis (Do you have

## 2023-07-24 ENCOUNTER — Other Ambulatory Visit: Payer: Medicare Other

## 2023-07-26 ENCOUNTER — Other Ambulatory Visit (HOSPITAL_COMMUNITY): Payer: Self-pay

## 2023-07-30 ENCOUNTER — Encounter (HOSPITAL_BASED_OUTPATIENT_CLINIC_OR_DEPARTMENT_OTHER): Payer: Medicare Other | Admitting: General Surgery

## 2023-07-30 ENCOUNTER — Other Ambulatory Visit (HOSPITAL_COMMUNITY): Payer: Self-pay

## 2023-07-30 DIAGNOSIS — E11622 Type 2 diabetes mellitus with other skin ulcer: Secondary | ICD-10-CM | POA: Diagnosis not present

## 2023-07-30 MED ORDER — SULFAMETHOXAZOLE-TRIMETHOPRIM 800-160 MG PO TABS
1.0000 | ORAL_TABLET | Freq: Two times a day (BID) | ORAL | 0 refills | Status: DC
  Filled 2023-07-30: qty 14, 7d supply, fill #0

## 2023-07-30 NOTE — Progress Notes (Signed)
[name] ([id number]) [id number] Page 1 of 9 Visit Report for 07/30/2023 Arrival Information Details Patient Name: Date of Service: Keith [name] 07/30/2023 2:00 PM Medical Record Number: [id number] Patient Account Number: [id number] Date of Birth/Sex: Treating RN: 02-Apr-1951 (72 y.o. Damaris Schooner Primary Care Caysen Whang: [place], Betty Other Clinician: Referring Kassaundra Hair: Treating Hartlee Amedee/Extender: Cannon, Jennifer [place], Betty Weeks in Treatment: 1 Visit Information History Since Last Visit Added or deleted any medications: No Patient Arrived: Wheel Chair Any new allergies or adverse reactions: No Arrival Time: 14:10 Had a fall or experienced change in No Accompanied By: spouse activities of daily living that may affect Transfer Assistance: Michiel Sites Lift risk of falls: Patient Identification Verified: Yes Signs or symptoms of abuse/neglect since last visito No Secondary Verification Process Completed: Yes Hospitalized since last visit: No Patient Requires Transmission-Based Precautions: No Implantable device outside of the clinic excluding No Patient Has Alerts: Yes cellular tissue based products placed in the center Patient Alerts: Patient on Blood Thinner since last visit: Eliquis Has Dressing in Place as Prescribed: Yes Pain Present Now: No Electronic Signature(s) Signed: 07/30/2023 4:04:43 PM By: Zenaida Deed RN, BSN Entered By: Zenaida Deed on 07/30/2023 11:19:10 -------------------------------------------------------------------------------- Encounter Discharge Information Details Patient Name: Date of Service: Keith [name], Keith Oppenheim T. 07/30/2023 2:00 PM Medical Record Number: [id number] Patient Account Number: [id number] Date of Birth/Sex: Treating RN: August 25, 1951 (72 y.o. Damaris Schooner Primary Care Lakeena Downie: [place], Betty Other Clinician: Referring Rigoberto Repass: Treating Teagon Kron/Extender: Cannon, Jennifer [place],  Betty Weeks in Treatment: 1 Encounter Discharge Information Items Post Procedure Vitals Discharge Condition: Stable Temperature (F): 97.7 Ambulatory Status: Wheelchair Pulse (bpm): 88 Discharge Destination: Home Respiratory Rate (breaths/min): 18 Transportation: Private Auto Blood Pressure (mmHg): 122/75 Accompanied By: spouse Schedule Follow-up Appointment: Yes Clinical Summary of Care: Patient Declined Electronic Signature(s) Signed: 07/30/2023 4:04:43 PM By: Zenaida Deed RN, BSN Entered By: Zenaida Deed on 07/30/2023 12:11:10 [name] ([id number]) [id number] Page 2 of 9 -------------------------------------------------------------------------------- Lower Extremity Assessment Details Patient Name: Date of Service: Keith [name] 07/30/2023 2:00 PM Medical Record Number: [id number] Patient Account Number: [id number] Date of Birth/Sex: Treating RN: Feb 27, 1951 (72 y.o. Damaris Schooner Primary Care Guadalupe Kerekes: [place], Betty Other Clinician: Referring Otilio Groleau: Treating Zackery Brine/Extender: Cannon, Jennifer [place], Betty Weeks in Treatment: 1 Electronic Signature(s) Signed: 07/30/2023 4:04:43 PM By: Zenaida Deed RN, BSN Entered By: Zenaida Deed on 07/30/2023 11:20:17 -------------------------------------------------------------------------------- Multi Wound Chart Details Patient Name: Date of Service: Keith School T. 07/30/2023 2:00 PM Medical Record Number: [id number] Patient Account Number: [id number] Date of Birth/Sex: Treating RN: 1951/05/14 (72 y.o. M) Primary Care Gracilyn Gunia: [place], Betty Other Clinician: Referring Cindra Austad: Treating Isela Stantz/Extender: Cannon, Jennifer [place], Betty Weeks in Treatment: 1 Vital Signs Height(in): 72 Pulse(bpm): 88 Weight(lbs): 242 Blood Pressure(mmHg): 122/75 Body Mass Index(BMI): 32.8 Temperature(F): 97.7 Respiratory Rate(breaths/min): 18 [1:Photos:] Left Gluteus Sacrum  Left, Medial Gluteus Wound Location: Pressure Injury Pressure Injury Bump Wounding Event: Pressure Ulcer Pressure Ulcer Abscess Primary Etiology: Type II Diabetes, Osteoarthritis, Type II Diabetes, Osteoarthritis, Type II Diabetes, Osteoarthritis, Comorbid History: Neuropathy Neuropathy Neuropathy 06/09/2023 06/24/2023 07/30/2023 Date Acquired: 1 1 0 Weeks of Treatment: Open Open Open Wound Status: No No No Wound Recurrence: 1.4x1.2x0.1 0.5x0.3x0.1 0.8x0.8x0.1 Measurements L x W x D (cm) 1.319 0.118 0.503 A (cm) : rea 0.132 0.012 0.05 Volume (cm) : 53.30% 78.50% N/A % Reduction in Area: 53.40% 78.20% N/A % Reduction in Volume: Category/Stage III Category/Stage III Full Thickness Without Exposed Classification: Support Structures Medium Small Medium  Pressure: Methods: Explain/Verbal Responses: Reinforcements needed, State content correctly Wound/Skin Impairment: Methods: Explain/Verbal Responses: Reinforcements needed, State content correctly Electronic Signature(s) Signed: 07/30/2023 4:04:43 PM By:  Zenaida Deed RN, BSN Entered By: Zenaida Deed on 07/30/2023 11:41:06 -------------------------------------------------------------------------------- Wound Assessment Details Patient Name: Date of Service: Keith School T. 07/30/2023 2:00 PM Medical Record Number: [id number] Patient Account Number: [id number] Date of Birth/Sex: Treating RN: 08-27-1951 (72 y.o. Damaris Schooner Primary Care Miro Balderson: [place], Betty Other Clinician: Referring Raye Wiens: Treating Devony Mcgrady/Extender: Cannon, Jennifer [place], Betty Weeks in Treatment: 1 Wound Status Wound Number: 1 Primary Etiology: Pressure Ulcer Wound Location: Left Gluteus Wound Status: Open Wounding Event: Pressure Injury Comorbid History: Type II Diabetes, Osteoarthritis, Neuropathy Date Acquired: 06/09/2023 Weeks Of Treatment: 1 Clustered Wound: No Photos Wound Measurements Length: (cm) 1.4 Width: (cm) 1.2 Depth: (cm) 0.1 Area: (cm) 1.319 Volume: (cm) 0.132 % Reduction in Area: 53.3% % Reduction in Volume: 53.4% Epithelialization: Small (1-33%) Tunneling: No Undermining: No Wound Description Classification: Category/Stage III Wound Margin: Distinct, outline attached Exudate Amount: Medium Exudate Type: Serosanguineous Exudate Color: red, brown Foul Odor After Cleansing: No Slough/Fibrino Yes Wound Bed Granulation Amount: Large (67-100%) Exposed Structure Granulation Quality: Red Fascia Exposed: No Necrotic Amount: Small (1-33%) Fat Layer (Subcutaneous Tissue) Exposed: Yes [name] ([id number]) [id number] Page 6 of 9 Necrotic Quality: Adherent Slough Tendon Exposed: No Muscle Exposed: No Joint Exposed: No Bone Exposed: No Periwound Skin Texture Texture Color No Abnormalities Noted: No No Abnormalities Noted: Yes Scarring: No Temperature / Pain Temperature: No Abnormality Moisture No Abnormalities Noted: Yes Treatment Notes Wound #1 (Gluteus) Wound Laterality:  Left Cleanser Soap and Water Discharge Instruction: May shower and wash wound with dial antibacterial soap and water prior to dressing change. Peri-Wound Care Topical Primary Dressing Maxorb Extra Ag+ Alginate Dressing, 2x2 (in/in) Discharge Instruction: Apply to wound bed as instructed Secondary Dressing ALLEVYN Gentle Border, 3x3 (in/in) Discharge Instruction: Apply over primary dressing as directed. Secured With Compression Wrap Compression Stockings Facilities manager) Signed: 07/30/2023 4:04:43 PM By: Zenaida Deed RN, BSN Entered By: Zenaida Deed on 07/30/2023 11:36:49 -------------------------------------------------------------------------------- Wound Assessment Details Patient Name: Date of Service: Keith School T. 07/30/2023 2:00 PM Medical Record Number: [id number] Patient Account Number: [id number] Date of Birth/Sex: Treating RN: Jun 29, 1951 (72 y.o. Damaris Schooner Primary Care Tylik Treese: [place], Betty Other Clinician: Referring Tonea Leiphart: Treating Ellicia Alix/Extender: Cannon, Jennifer [place], Betty Weeks in Treatment: 1 Wound Status Wound Number: 2 Primary Etiology: Pressure Ulcer Wound Location: Sacrum Wound Status: Open Wounding Event: Pressure Injury Comorbid History: Type II Diabetes, Osteoarthritis, Neuropathy Date Acquired: 06/24/2023 Weeks Of Treatment: 1 Clustered Wound: No Photos [name] ([id number]) [phone] Page 7 of 9 Wound Measurements Length: (cm) 0.5 Width: (cm) 0.3 Depth: (cm) 0.1 Area: (cm) 0.118 Volume: (cm) 0.012 % Reduction in Area: 78.5% % Reduction in Volume: 78.2% Epithelialization: Medium (34-66%) Tunneling: No Undermining: No Wound Description Classification: Category/Stage III Wound Margin: Distinct, outline attached Exudate Amount: Small Exudate Type: Serosanguineous Exudate Color: red, brown Foul Odor After Cleansing: No Slough/Fibrino Yes Wound  Bed Granulation Amount: Large (67-100%) Exposed Structure Granulation Quality: Red Fascia Exposed: No Necrotic Amount: None Present (0%) Fat Layer (Subcutaneous Tissue) Exposed: Yes Tendon Exposed: No Muscle Exposed: No Joint Exposed: No Bone Exposed: No Periwound Skin Texture Texture Color No Abnormalities Noted: Yes No Abnormalities Noted: Yes Moisture Temperature / Pain No Abnormalities Noted: Yes Temperature: No Abnormality Treatment Notes Wound #2 (Sacrum) Cleanser Soap and Water Discharge Instruction: May shower and wash wound with  Exudate Amount: Serosanguineous Serosanguineous Purulent Exudate Type: red, brown red, brown yellow, brown, green Exudate Color: Distinct, outline attached Distinct, outline attached Indistinct, nonvisible Wound Margin: Large (67-100%) Large (67-100%) None Present (0%) Granulation Amount: Red Red N/A Granulation QualityFlorence Canner ([id number]) [id number] Page 3 of 9 Small (1-33%) None Present (0%) None Present (0%) Necrotic Amount: Fat Layer (Subcutaneous Tissue): Yes Fat Layer (Subcutaneous Tissue): Yes Fascia: No Exposed Structures: Fascia: No Fascia: No Fat Layer (Subcutaneous Tissue): No Tendon: No Tendon: No Tendon: No Muscle: No Muscle: No Muscle: No Joint: No Joint: No Joint: No Bone: No Bone: No Bone: No Limited to Skin Breakdown Small (1-33%) Medium (34-66%) Large (67-100%) Epithelialization: N/A N/A Debridement - Selective/Open Wound Debridement: Pre-procedure Verification/Time Out N/A N/A 14:45 Taken: N/A N/A Lidocaine 4% Topical Solution Pain Control: N/A N/A Skin/Epidermis Level: N/A N/A 0.5 Debridement A (sq cm): rea N/A N/A Forceps,  Scissors Instrument: N/A N/A Swab Specimen: N/A N/A 1 Number of Specimens Taken: N/A N/A Minimum Bleeding: N/A N/A Pressure Hemostasis A chieved: N/A N/A 0 Procedural Pain: N/A N/A 0 Post Procedural Pain: N/A N/A Procedure was tolerated well Debridement Treatment Response: N/A N/A 0.8x0.8x0.1 Post Debridement Measurements L x W x D (cm) N/A N/A 0.05 Post Debridement Volume: (cm) Scarring: No No Abnormalities Noted No Abnormalities Noted Periwound Skin Texture: No Abnormalities Noted No Abnormalities Noted No Abnormalities Noted Periwound Skin Moisture: No Abnormalities Noted No Abnormalities Noted No Abnormalities Noted Periwound Skin Color: No Abnormality No Abnormality No Abnormality Temperature: N/A N/A Yes Tenderness on Palpation: N/A N/A Debridement Procedures Performed: Treatment Notes Electronic Signature(s) Signed: 07/30/2023 2:58:11 PM By: Duanne Guess MD FACS Entered By: Duanne Guess on 07/30/2023 11:58:10 -------------------------------------------------------------------------------- Multi-Disciplinary Care Plan Details Patient Name: Date of Service: Keith School T. 07/30/2023 2:00 PM Medical Record Number: [id number] Patient Account Number: [id number] Date of Birth/Sex: Treating RN: 1951/03/16 (72 y.o. Damaris Schooner Primary Care Khara Renaud: [place], Betty Other Clinician: Referring Temperence Zenor: Treating Milon Dethloff/Extender: Cannon, Jennifer [place], Betty Weeks in Treatment: 1 Multidisciplinary Care Plan reviewed with physician Active Inactive Wound/Skin Impairment Nursing Diagnoses: Impaired tissue integrity Goals: Patient/caregiver will verbalize understanding of skin care regimen Date Initiated: 07/22/2023 Target Resolution Date: 10/08/2023 Goal Status: Active Interventions: Assess ulceration(s) every visit Treatment Activities: Skin care regimen initiated : 07/22/2023 Notes: JOSHEPH, NICHOLES ([id number])  [phone].pdf Page 4 of 9 Electronic Signature(s) Signed: 07/30/2023 4:04:43 PM By: Zenaida Deed RN, BSN Entered By: Zenaida Deed on 07/30/2023 11:40:43 -------------------------------------------------------------------------------- Pain Assessment Details Patient Name: Date of Service: Keith School T. 07/30/2023 2:00 PM Medical Record Number: [id number] Patient Account Number: [id number] Date of Birth/Sex: Treating RN: 1950-12-14 (72 y.o. Damaris Schooner Primary Care Ivanka Kirshner: [place], Betty Other Clinician: Referring Erna Brossard: Treating Toy Eisemann/Extender: Cannon, Jennifer [place], Betty Weeks in Treatment: 1 Active Problems Location of Pain Severity and Description of Pain Patient Has Paino No Site Locations Rate the pain. Current Pain Level: 0 Pain Management and Medication Current Pain Management: Electronic Signature(s) Signed: 07/30/2023 4:04:43 PM By: Zenaida Deed RN, BSN Entered By: Zenaida Deed on 07/30/2023 11:20:10 -------------------------------------------------------------------------------- Patient/Caregiver Education Details Patient Name: Date of Service: Keith [name] 10/22/2024andnbsp2:00 PM Medical Record Number: [id number] Patient Account Number: [id number] Date of Birth/Gender: Treating RN: 25-Jul-1951 (72 y.o. Damaris Schooner Primary Care Physician: [place], Betty Other Clinician: Referring Physician: Treating Physician/Extender: Cannon, Jennifer [place], Betty Weeks in Treatment: 1 Education Assessment Education Provided To: [name] ([id number]) [id number] Page 5 of 9 Patient Education Topics Provided  [name] ([id number]) [id number] Page 1 of 9 Visit Report for 07/30/2023 Arrival Information Details Patient Name: Date of Service: Keith [name] 07/30/2023 2:00 PM Medical Record Number: [id number] Patient Account Number: [id number] Date of Birth/Sex: Treating RN: 02-Apr-1951 (72 y.o. Damaris Schooner Primary Care Caysen Whang: [place], Betty Other Clinician: Referring Kassaundra Hair: Treating Hartlee Amedee/Extender: Cannon, Jennifer [place], Betty Weeks in Treatment: 1 Visit Information History Since Last Visit Added or deleted any medications: No Patient Arrived: Wheel Chair Any new allergies or adverse reactions: No Arrival Time: 14:10 Had a fall or experienced change in No Accompanied By: spouse activities of daily living that may affect Transfer Assistance: Michiel Sites Lift risk of falls: Patient Identification Verified: Yes Signs or symptoms of abuse/neglect since last visito No Secondary Verification Process Completed: Yes Hospitalized since last visit: No Patient Requires Transmission-Based Precautions: No Implantable device outside of the clinic excluding No Patient Has Alerts: Yes cellular tissue based products placed in the center Patient Alerts: Patient on Blood Thinner since last visit: Eliquis Has Dressing in Place as Prescribed: Yes Pain Present Now: No Electronic Signature(s) Signed: 07/30/2023 4:04:43 PM By: Zenaida Deed RN, BSN Entered By: Zenaida Deed on 07/30/2023 11:19:10 -------------------------------------------------------------------------------- Encounter Discharge Information Details Patient Name: Date of Service: Keith [name], Keith Oppenheim T. 07/30/2023 2:00 PM Medical Record Number: [id number] Patient Account Number: [id number] Date of Birth/Sex: Treating RN: August 25, 1951 (72 y.o. Damaris Schooner Primary Care Lakeena Downie: [place], Betty Other Clinician: Referring Rigoberto Repass: Treating Teagon Kron/Extender: Cannon, Jennifer [place],  Betty Weeks in Treatment: 1 Encounter Discharge Information Items Post Procedure Vitals Discharge Condition: Stable Temperature (F): 97.7 Ambulatory Status: Wheelchair Pulse (bpm): 88 Discharge Destination: Home Respiratory Rate (breaths/min): 18 Transportation: Private Auto Blood Pressure (mmHg): 122/75 Accompanied By: spouse Schedule Follow-up Appointment: Yes Clinical Summary of Care: Patient Declined Electronic Signature(s) Signed: 07/30/2023 4:04:43 PM By: Zenaida Deed RN, BSN Entered By: Zenaida Deed on 07/30/2023 12:11:10 [name] ([id number]) [id number] Page 2 of 9 -------------------------------------------------------------------------------- Lower Extremity Assessment Details Patient Name: Date of Service: Keith [name] 07/30/2023 2:00 PM Medical Record Number: [id number] Patient Account Number: [id number] Date of Birth/Sex: Treating RN: Feb 27, 1951 (72 y.o. Damaris Schooner Primary Care Guadalupe Kerekes: [place], Betty Other Clinician: Referring Otilio Groleau: Treating Zackery Brine/Extender: Cannon, Jennifer [place], Betty Weeks in Treatment: 1 Electronic Signature(s) Signed: 07/30/2023 4:04:43 PM By: Zenaida Deed RN, BSN Entered By: Zenaida Deed on 07/30/2023 11:20:17 -------------------------------------------------------------------------------- Multi Wound Chart Details Patient Name: Date of Service: Keith School T. 07/30/2023 2:00 PM Medical Record Number: [id number] Patient Account Number: [id number] Date of Birth/Sex: Treating RN: 1951/05/14 (72 y.o. M) Primary Care Gracilyn Gunia: [place], Betty Other Clinician: Referring Cindra Austad: Treating Isela Stantz/Extender: Cannon, Jennifer [place], Betty Weeks in Treatment: 1 Vital Signs Height(in): 72 Pulse(bpm): 88 Weight(lbs): 242 Blood Pressure(mmHg): 122/75 Body Mass Index(BMI): 32.8 Temperature(F): 97.7 Respiratory Rate(breaths/min): 18 [1:Photos:] Left Gluteus Sacrum  Left, Medial Gluteus Wound Location: Pressure Injury Pressure Injury Bump Wounding Event: Pressure Ulcer Pressure Ulcer Abscess Primary Etiology: Type II Diabetes, Osteoarthritis, Type II Diabetes, Osteoarthritis, Type II Diabetes, Osteoarthritis, Comorbid History: Neuropathy Neuropathy Neuropathy 06/09/2023 06/24/2023 07/30/2023 Date Acquired: 1 1 0 Weeks of Treatment: Open Open Open Wound Status: No No No Wound Recurrence: 1.4x1.2x0.1 0.5x0.3x0.1 0.8x0.8x0.1 Measurements L x W x D (cm) 1.319 0.118 0.503 A (cm) : rea 0.132 0.012 0.05 Volume (cm) : 53.30% 78.50% N/A % Reduction in Area: 53.40% 78.20% N/A % Reduction in Volume: Category/Stage III Category/Stage III Full Thickness Without Exposed Classification: Support Structures Medium Small Medium

## 2023-07-30 NOTE — Progress Notes (Signed)
Information Obtained From Patient Chart Respiratory Medical History: Past Medical History Notes: Hx: Community Aquired Pneumonia; Loculated Pleural Effusion; Chronic Respiratory Failure with Hypoxia Gastrointestinal Medical History: Past Medical History Notes: Hx: Drug induced Constipation;GERD Keith [name], Keith [name] ([id number]) [id number] Page 8 of 9 Endocrine Medical History: Positive for: Type II Diabetes - pt. states he is not Diabetic Past Medical History Notes: Last Hem A1C: 5.1% (07/01/23) Genitourinary Medical History: Past Medical History Notes: Chronic Kidney Disease Stage  III Musculoskeletal Medical History: Positive for: Osteoarthritis Past Medical History Notes: Hx: Closed Right Trimalleolar Fracture;Closed Right Ankle Fracture; Bilateral Leg weakness Neurologic Medical History: Positive for: Neuropathy - Diabetic Peripheral Neuropathy Past Medical History Notes: Hx: Spinal Cord Compression;Thoracic Myelopathy Immunizations Pneumococcal Vaccine: Received Pneumococcal Vaccination: Yes Received Pneumococcal Vaccination On or After 60th Birthday: Yes Implantable Devices No devices added Family and Social History Unknown History: Yes; Former smoker - ended on 10/08/1993; Marital Status - Married; Alcohol Use: Rarely; Drug Use: No History; Caffeine Use: Moderate; Financial Concerns: No; Food, Clothing or Shelter Needs: No; Support System Lacking: No; Transportation Concerns: No Electronic Signature(s) Signed: 07/30/2023 3:10:24 PM By: Duanne Guess MD FACS Entered By: Duanne Guess on 07/30/2023 12:00:24 -------------------------------------------------------------------------------- SuperBill Details Patient Name: Date of Service: Keith [name] 07/30/2023 Medical Record Number: [id number] Patient Account Number: [id number] Date of Birth/Sex: Treating RN: December 12, 1950 (72 y.o. M) Primary Care Provider: [place], Keith Other Clinician: Referring Provider: Treating Provider/Extender: Keith [name], Keith Weeks in Treatment: 1 Diagnosis Coding ICD-10 Codes Code Description [phone] Pressure ulcer of left buttock, stage 3 L89.153 Pressure ulcer of sacral region, stage 3 L02.31 Cutaneous abscess of buttock E11.622 Type 2 diabetes mellitus with other skin ulcer G95.20 Unspecified cord compression N18.30 Chronic kidney disease, stage 3 unspecified Facility Procedures Keith [name], Keith [name] ([id number]): CPT4 Code Description [id number] [phone] - DEBRIDE WOUND 1ST 20 SQ CM OR < ICD-10 Diagnosis Description L02.31 Cutaneous abscess of  buttock [id number] Page 9 of 9: Modifier Quantity 1 Physician Procedures : CPT4 Code Description Modifier [id number] - WC PHYS LEVEL 4 - EST PT 25 ICD-10 Diagnosis Description L89.323 Pressure ulcer of left buttock, stage 3 L89.153 Pressure ulcer of sacral region, stage 3 L02.31 Cutaneous abscess of buttock E11.622 Type 2  diabetes mellitus with other skin ulcer Quantity: 1 : [id number] - WC PHYS DEBR WO ANESTH 20 SQ CM ICD-10 Diagnosis Description L02.31 Cutaneous abscess of buttock Quantity: 1 Electronic Signature(s) Signed: 07/30/2023 3:05:21 PM By: Duanne Guess MD FACS Entered By: Duanne Guess on 07/30/2023 12:05:21  Information Obtained From Patient Chart Respiratory Medical History: Past Medical History Notes: Hx: Community Aquired Pneumonia; Loculated Pleural Effusion; Chronic Respiratory Failure with Hypoxia Gastrointestinal Medical History: Past Medical History Notes: Hx: Drug induced Constipation;GERD Keith [name], Keith [name] ([id number]) [id number] Page 8 of 9 Endocrine Medical History: Positive for: Type II Diabetes - pt. states he is not Diabetic Past Medical History Notes: Last Hem A1C: 5.1% (07/01/23) Genitourinary Medical History: Past Medical History Notes: Chronic Kidney Disease Stage  III Musculoskeletal Medical History: Positive for: Osteoarthritis Past Medical History Notes: Hx: Closed Right Trimalleolar Fracture;Closed Right Ankle Fracture; Bilateral Leg weakness Neurologic Medical History: Positive for: Neuropathy - Diabetic Peripheral Neuropathy Past Medical History Notes: Hx: Spinal Cord Compression;Thoracic Myelopathy Immunizations Pneumococcal Vaccine: Received Pneumococcal Vaccination: Yes Received Pneumococcal Vaccination On or After 60th Birthday: Yes Implantable Devices No devices added Family and Social History Unknown History: Yes; Former smoker - ended on 10/08/1993; Marital Status - Married; Alcohol Use: Rarely; Drug Use: No History; Caffeine Use: Moderate; Financial Concerns: No; Food, Clothing or Shelter Needs: No; Support System Lacking: No; Transportation Concerns: No Electronic Signature(s) Signed: 07/30/2023 3:10:24 PM By: Duanne Guess MD FACS Entered By: Duanne Guess on 07/30/2023 12:00:24 -------------------------------------------------------------------------------- SuperBill Details Patient Name: Date of Service: Keith [name] 07/30/2023 Medical Record Number: [id number] Patient Account Number: [id number] Date of Birth/Sex: Treating RN: December 12, 1950 (72 y.o. M) Primary Care Provider: [place], Keith Other Clinician: Referring Provider: Treating Provider/Extender: Keith [name], Keith Weeks in Treatment: 1 Diagnosis Coding ICD-10 Codes Code Description [phone] Pressure ulcer of left buttock, stage 3 L89.153 Pressure ulcer of sacral region, stage 3 L02.31 Cutaneous abscess of buttock E11.622 Type 2 diabetes mellitus with other skin ulcer G95.20 Unspecified cord compression N18.30 Chronic kidney disease, stage 3 unspecified Facility Procedures Keith [name], Keith [name] ([id number]): CPT4 Code Description [id number] [phone] - DEBRIDE WOUND 1ST 20 SQ CM OR < ICD-10 Diagnosis Description L02.31 Cutaneous abscess of  buttock [id number] Page 9 of 9: Modifier Quantity 1 Physician Procedures : CPT4 Code Description Modifier [id number] - WC PHYS LEVEL 4 - EST PT 25 ICD-10 Diagnosis Description L89.323 Pressure ulcer of left buttock, stage 3 L89.153 Pressure ulcer of sacral region, stage 3 L02.31 Cutaneous abscess of buttock E11.622 Type 2  diabetes mellitus with other skin ulcer Quantity: 1 : [id number] - WC PHYS DEBR WO ANESTH 20 SQ CM ICD-10 Diagnosis Description L02.31 Cutaneous abscess of buttock Quantity: 1 Electronic Signature(s) Signed: 07/30/2023 3:05:21 PM By: Duanne Guess MD FACS Entered By: Duanne Guess on 07/30/2023 12:05:21  Physical Exam Details Patient Name: Date of Service: Keith [name] 07/30/2023 2:00 PM Medical Record Number: [id number] Patient Account Number: [id number] Date of Birth/Sex: Treating RN: 1950-11-27 (72 y.o. M) Primary Care Provider: [place], Keith Other Clinician: Referring Provider: Treating Provider/Extender: Keith [name], Keith Weeks in Treatment: 1 Constitutional . . . . no acute distress. Respiratory Normal work of breathing on room air. Notes 07/30/2023: Both of the existing pressure ulcers are smaller today. They are quite clean without any slough or eschar accumulation. He has a small abscess on his right medial buttock. Keith [name] ([id number]) [id number] Page 3 of 9 Electronic Signature(s) Signed: 07/30/2023 3:00:50 PM By: Duanne Guess MD FACS Entered By: Duanne Guess on 07/30/2023 12:00:50 -------------------------------------------------------------------------------- Physician Orders Details Patient Name: Date of Service: Keith School [name]. 07/30/2023 2:00 PM Medical Record Number: [id number] Patient Account Number: [id number] Date of Birth/Sex: Treating RN: Nov 28, 1950 (72 y.o. Keith [name] Primary Care Provider: [place], Keith Other Clinician: Referring Provider: Treating Provider/Extender: Keith [name], Keith Weeks in Treatment: 1 The following information was scribed  by: Zenaida Deed The information was scribed for: Duanne Guess Verbal / Phone Orders: No Diagnosis Coding ICD-10 Coding Code Description [phone] Pressure ulcer of left buttock, stage 3 L89.153 Pressure ulcer of sacral region, stage 3 L02.31 Cutaneous abscess of buttock E11.622 Type 2 diabetes mellitus with other skin ulcer G95.20 Unspecified cord compression N18.30 Chronic kidney disease, stage 3 unspecified Follow-up Appointments ppointment in 2 weeks. - Dr. Lady Gary RM 1 or 3 ******HOYER*** Return A Anesthetic (In clinic) Topical Lidocaine 4% applied to wound bed Bathing/ Shower/ Hygiene May shower and wash wound with soap and water. Off-Loading Other: - Will order Roho Cushion. While in wheelchair, keep shifting weight . Turn in bed at least every 2 hours Additional Orders / Instructions Follow Nutritious Diet Other: - Goal is 70-100g protein per day Wound Treatment Wound #1 - Gluteus Wound Laterality: Left Cleanser: Soap and Water 1 x Per Day/30 Days Discharge Instructions: May shower and wash wound with dial antibacterial soap and water prior to dressing change. Prim Dressing: Maxorb Extra Ag+ Alginate Dressing, 2x2 (in/in) (Generic) 1 x Per Day/30 Days ary Discharge Instructions: Apply to wound bed as instructed Secondary Dressing: ALLEVYN Gentle Border, 3x3 (in/in) (Generic) 1 x Per Day/30 Days Discharge Instructions: Apply over primary dressing as directed. Wound #2 - Sacrum Cleanser: Soap and Water 1 x Per Day/30 Days Discharge Instructions: May shower and wash wound with dial antibacterial soap and water prior to dressing change. Prim Dressing: Maxorb Extra Ag+ Alginate Dressing, 2x2 (in/in) (Generic) 1 x Per Day/30 Days ary Discharge Instructions: Apply to wound bed as instructed Secondary Dressing: ALLEVYN Gentle Border, 3x3 (in/in) (Generic) 1 x Per Day/30 Days Discharge Instructions: Apply over primary dressing as directed. Keith [name] ([id number])  [id number] Page 4 of 9 Wound #3 - Gluteus Wound Laterality: Left, Medial Cleanser: Soap and Water 1 x Per Day/30 Days Discharge Instructions: May shower and wash wound with dial antibacterial soap and water prior to dressing change. Prim Dressing: Maxorb Extra Ag+ Alginate Dressing, 2x2 (in/in) (Generic) 1 x Per Day/30 Days ary Discharge Instructions: Apply to wound bed as instructed Secondary Dressing: ALLEVYN Gentle Border, 3x3 (in/in) (Generic) 1 x Per Day/30 Days Discharge Instructions: Apply over primary dressing as directed. Laboratory naerobe culture (MICRO) - left medial gluteus Bacteria identified in Unspecified specimen by A LOINC Code: 635-3 Convenience Name: Anaerobic culture Patient Medications llergies: lisinopril, aspirin A Notifications Medication  Information Obtained From Patient Chart Respiratory Medical History: Past Medical History Notes: Hx: Community Aquired Pneumonia; Loculated Pleural Effusion; Chronic Respiratory Failure with Hypoxia Gastrointestinal Medical History: Past Medical History Notes: Hx: Drug induced Constipation;GERD Keith [name], Keith [name] ([id number]) [id number] Page 8 of 9 Endocrine Medical History: Positive for: Type II Diabetes - pt. states he is not Diabetic Past Medical History Notes: Last Hem A1C: 5.1% (07/01/23) Genitourinary Medical History: Past Medical History Notes: Chronic Kidney Disease Stage  III Musculoskeletal Medical History: Positive for: Osteoarthritis Past Medical History Notes: Hx: Closed Right Trimalleolar Fracture;Closed Right Ankle Fracture; Bilateral Leg weakness Neurologic Medical History: Positive for: Neuropathy - Diabetic Peripheral Neuropathy Past Medical History Notes: Hx: Spinal Cord Compression;Thoracic Myelopathy Immunizations Pneumococcal Vaccine: Received Pneumococcal Vaccination: Yes Received Pneumococcal Vaccination On or After 60th Birthday: Yes Implantable Devices No devices added Family and Social History Unknown History: Yes; Former smoker - ended on 10/08/1993; Marital Status - Married; Alcohol Use: Rarely; Drug Use: No History; Caffeine Use: Moderate; Financial Concerns: No; Food, Clothing or Shelter Needs: No; Support System Lacking: No; Transportation Concerns: No Electronic Signature(s) Signed: 07/30/2023 3:10:24 PM By: Duanne Guess MD FACS Entered By: Duanne Guess on 07/30/2023 12:00:24 -------------------------------------------------------------------------------- SuperBill Details Patient Name: Date of Service: Keith [name] 07/30/2023 Medical Record Number: [id number] Patient Account Number: [id number] Date of Birth/Sex: Treating RN: December 12, 1950 (72 y.o. M) Primary Care Provider: [place], Keith Other Clinician: Referring Provider: Treating Provider/Extender: Keith [name], Keith Weeks in Treatment: 1 Diagnosis Coding ICD-10 Codes Code Description [phone] Pressure ulcer of left buttock, stage 3 L89.153 Pressure ulcer of sacral region, stage 3 L02.31 Cutaneous abscess of buttock E11.622 Type 2 diabetes mellitus with other skin ulcer G95.20 Unspecified cord compression N18.30 Chronic kidney disease, stage 3 unspecified Facility Procedures Keith [name], Keith [name] ([id number]): CPT4 Code Description [id number] - DEBRIDE WOUND 1ST 20 SQ CM OR < ICD-10 Diagnosis Description L02.31 Cutaneous abscess of  buttock [id number] Page 9 of 9: Modifier Quantity 1 Physician Procedures : CPT4 Code Description Modifier [id number] - WC PHYS LEVEL 4 - EST PT 25 ICD-10 Diagnosis Description L89.323 Pressure ulcer of left buttock, stage 3 L89.153 Pressure ulcer of sacral region, stage 3 L02.31 Cutaneous abscess of buttock E11.622 Type 2  diabetes mellitus with other skin ulcer Quantity: 1 : [id number] - WC PHYS DEBR WO ANESTH 20 SQ CM ICD-10 Diagnosis Description L02.31 Cutaneous abscess of buttock Quantity: 1 Electronic Signature(s) Signed: 07/30/2023 3:05:21 PM By: Duanne Guess MD FACS Entered By: Duanne Guess on 07/30/2023 12:05:21  Physical Exam Details Patient Name: Date of Service: Keith [name] 07/30/2023 2:00 PM Medical Record Number: [id number] Patient Account Number: [id number] Date of Birth/Sex: Treating RN: 1950-11-27 (72 y.o. M) Primary Care Provider: [place], Keith Other Clinician: Referring Provider: Treating Provider/Extender: Keith [name], Keith Weeks in Treatment: 1 Constitutional . . . . no acute distress. Respiratory Normal work of breathing on room air. Notes 07/30/2023: Both of the existing pressure ulcers are smaller today. They are quite clean without any slough or eschar accumulation. He has a small abscess on his right medial buttock. Keith [name] ([id number]) [id number] Page 3 of 9 Electronic Signature(s) Signed: 07/30/2023 3:00:50 PM By: Duanne Guess MD FACS Entered By: Duanne Guess on 07/30/2023 12:00:50 -------------------------------------------------------------------------------- Physician Orders Details Patient Name: Date of Service: Keith School [name]. 07/30/2023 2:00 PM Medical Record Number: [id number] Patient Account Number: [id number] Date of Birth/Sex: Treating RN: Nov 28, 1950 (72 y.o. Keith [name] Primary Care Provider: [place], Keith Other Clinician: Referring Provider: Treating Provider/Extender: Keith [name], Keith Weeks in Treatment: 1 The following information was scribed  by: Zenaida Deed The information was scribed for: Duanne Guess Verbal / Phone Orders: No Diagnosis Coding ICD-10 Coding Code Description [phone] Pressure ulcer of left buttock, stage 3 L89.153 Pressure ulcer of sacral region, stage 3 L02.31 Cutaneous abscess of buttock E11.622 Type 2 diabetes mellitus with other skin ulcer G95.20 Unspecified cord compression N18.30 Chronic kidney disease, stage 3 unspecified Follow-up Appointments ppointment in 2 weeks. - Dr. Lady Gary RM 1 or 3 ******HOYER*** Return A Anesthetic (In clinic) Topical Lidocaine 4% applied to wound bed Bathing/ Shower/ Hygiene May shower and wash wound with soap and water. Off-Loading Other: - Will order Roho Cushion. While in wheelchair, keep shifting weight . Turn in bed at least every 2 hours Additional Orders / Instructions Follow Nutritious Diet Other: - Goal is 70-100g protein per day Wound Treatment Wound #1 - Gluteus Wound Laterality: Left Cleanser: Soap and Water 1 x Per Day/30 Days Discharge Instructions: May shower and wash wound with dial antibacterial soap and water prior to dressing change. Prim Dressing: Maxorb Extra Ag+ Alginate Dressing, 2x2 (in/in) (Generic) 1 x Per Day/30 Days ary Discharge Instructions: Apply to wound bed as instructed Secondary Dressing: ALLEVYN Gentle Border, 3x3 (in/in) (Generic) 1 x Per Day/30 Days Discharge Instructions: Apply over primary dressing as directed. Wound #2 - Sacrum Cleanser: Soap and Water 1 x Per Day/30 Days Discharge Instructions: May shower and wash wound with dial antibacterial soap and water prior to dressing change. Prim Dressing: Maxorb Extra Ag+ Alginate Dressing, 2x2 (in/in) (Generic) 1 x Per Day/30 Days ary Discharge Instructions: Apply to wound bed as instructed Secondary Dressing: ALLEVYN Gentle Border, 3x3 (in/in) (Generic) 1 x Per Day/30 Days Discharge Instructions: Apply over primary dressing as directed. Keith [name] ([id number])  [id number] Page 4 of 9 Wound #3 - Gluteus Wound Laterality: Left, Medial Cleanser: Soap and Water 1 x Per Day/30 Days Discharge Instructions: May shower and wash wound with dial antibacterial soap and water prior to dressing change. Prim Dressing: Maxorb Extra Ag+ Alginate Dressing, 2x2 (in/in) (Generic) 1 x Per Day/30 Days ary Discharge Instructions: Apply to wound bed as instructed Secondary Dressing: ALLEVYN Gentle Border, 3x3 (in/in) (Generic) 1 x Per Day/30 Days Discharge Instructions: Apply over primary dressing as directed. Laboratory naerobe culture (MICRO) - left medial gluteus Bacteria identified in Unspecified specimen by A LOINC Code: 635-3 Convenience Name: Anaerobic culture Patient Medications llergies: lisinopril, aspirin A Notifications Medication  Information Obtained From Patient Chart Respiratory Medical History: Past Medical History Notes: Hx: Community Aquired Pneumonia; Loculated Pleural Effusion; Chronic Respiratory Failure with Hypoxia Gastrointestinal Medical History: Past Medical History Notes: Hx: Drug induced Constipation;GERD Keith [name], Keith [name] ([id number]) [id number] Page 8 of 9 Endocrine Medical History: Positive for: Type II Diabetes - pt. states he is not Diabetic Past Medical History Notes: Last Hem A1C: 5.1% (07/01/23) Genitourinary Medical History: Past Medical History Notes: Chronic Kidney Disease Stage  III Musculoskeletal Medical History: Positive for: Osteoarthritis Past Medical History Notes: Hx: Closed Right Trimalleolar Fracture;Closed Right Ankle Fracture; Bilateral Leg weakness Neurologic Medical History: Positive for: Neuropathy - Diabetic Peripheral Neuropathy Past Medical History Notes: Hx: Spinal Cord Compression;Thoracic Myelopathy Immunizations Pneumococcal Vaccine: Received Pneumococcal Vaccination: Yes Received Pneumococcal Vaccination On or After 60th Birthday: Yes Implantable Devices No devices added Family and Social History Unknown History: Yes; Former smoker - ended on 10/08/1993; Marital Status - Married; Alcohol Use: Rarely; Drug Use: No History; Caffeine Use: Moderate; Financial Concerns: No; Food, Clothing or Shelter Needs: No; Support System Lacking: No; Transportation Concerns: No Electronic Signature(s) Signed: 07/30/2023 3:10:24 PM By: Duanne Guess MD FACS Entered By: Duanne Guess on 07/30/2023 12:00:24 -------------------------------------------------------------------------------- SuperBill Details Patient Name: Date of Service: Keith [name] 07/30/2023 Medical Record Number: [id number] Patient Account Number: [id number] Date of Birth/Sex: Treating RN: December 12, 1950 (72 y.o. M) Primary Care Provider: [place], Keith Other Clinician: Referring Provider: Treating Provider/Extender: Keith [name], Keith Weeks in Treatment: 1 Diagnosis Coding ICD-10 Codes Code Description [phone] Pressure ulcer of left buttock, stage 3 L89.153 Pressure ulcer of sacral region, stage 3 L02.31 Cutaneous abscess of buttock E11.622 Type 2 diabetes mellitus with other skin ulcer G95.20 Unspecified cord compression N18.30 Chronic kidney disease, stage 3 unspecified Facility Procedures Keith [name], Keith [name] ([id number]): CPT4 Code Description [id number] [phone] - DEBRIDE WOUND 1ST 20 SQ CM OR < ICD-10 Diagnosis Description L02.31 Cutaneous abscess of  buttock [id number] Page 9 of 9: Modifier Quantity 1 Physician Procedures : CPT4 Code Description Modifier [id number] - WC PHYS LEVEL 4 - EST PT 25 ICD-10 Diagnosis Description L89.323 Pressure ulcer of left buttock, stage 3 L89.153 Pressure ulcer of sacral region, stage 3 L02.31 Cutaneous abscess of buttock E11.622 Type 2  diabetes mellitus with other skin ulcer Quantity: 1 : [id number] - WC PHYS DEBR WO ANESTH 20 SQ CM ICD-10 Diagnosis Description L02.31 Cutaneous abscess of buttock Quantity: 1 Electronic Signature(s) Signed: 07/30/2023 3:05:21 PM By: Duanne Guess MD FACS Entered By: Duanne Guess on 07/30/2023 12:05:21

## 2023-07-31 ENCOUNTER — Other Ambulatory Visit: Payer: Self-pay | Admitting: Physical Medicine and Rehabilitation

## 2023-08-01 ENCOUNTER — Other Ambulatory Visit (HOSPITAL_COMMUNITY): Payer: Self-pay | Admitting: Cardiology

## 2023-08-07 ENCOUNTER — Other Ambulatory Visit (HOSPITAL_COMMUNITY): Payer: Self-pay

## 2023-08-08 ENCOUNTER — Other Ambulatory Visit (HOSPITAL_COMMUNITY): Payer: Self-pay

## 2023-08-08 MED ORDER — GABAPENTIN 800 MG PO TABS
800.0000 mg | ORAL_TABLET | Freq: Three times a day (TID) | ORAL | 0 refills | Status: DC
Start: 2023-08-08 — End: 2023-10-22

## 2023-08-08 MED ORDER — HYDROCODONE-ACETAMINOPHEN 7.5-325 MG PO TABS
1.0000 | ORAL_TABLET | Freq: Four times a day (QID) | ORAL | 0 refills | Status: DC | PRN
Start: 2023-08-08 — End: 2023-09-18
  Filled 2023-08-08: qty 120, 30d supply, fill #0

## 2023-08-14 ENCOUNTER — Encounter (HOSPITAL_BASED_OUTPATIENT_CLINIC_OR_DEPARTMENT_OTHER): Payer: Medicare Other | Attending: General Surgery | Admitting: General Surgery

## 2023-08-14 DIAGNOSIS — E11622 Type 2 diabetes mellitus with other skin ulcer: Secondary | ICD-10-CM | POA: Diagnosis present

## 2023-08-14 DIAGNOSIS — Z8701 Personal history of pneumonia (recurrent): Secondary | ICD-10-CM | POA: Diagnosis not present

## 2023-08-14 DIAGNOSIS — N183 Chronic kidney disease, stage 3 unspecified: Secondary | ICD-10-CM | POA: Insufficient documentation

## 2023-08-14 DIAGNOSIS — E1142 Type 2 diabetes mellitus with diabetic polyneuropathy: Secondary | ICD-10-CM | POA: Diagnosis not present

## 2023-08-14 DIAGNOSIS — M1990 Unspecified osteoarthritis, unspecified site: Secondary | ICD-10-CM | POA: Diagnosis not present

## 2023-08-14 DIAGNOSIS — L89313 Pressure ulcer of right buttock, stage 3: Secondary | ICD-10-CM | POA: Insufficient documentation

## 2023-08-14 DIAGNOSIS — K219 Gastro-esophageal reflux disease without esophagitis: Secondary | ICD-10-CM | POA: Insufficient documentation

## 2023-08-14 DIAGNOSIS — E1122 Type 2 diabetes mellitus with diabetic chronic kidney disease: Secondary | ICD-10-CM | POA: Insufficient documentation

## 2023-08-14 DIAGNOSIS — L89153 Pressure ulcer of sacral region, stage 3: Secondary | ICD-10-CM | POA: Diagnosis not present

## 2023-08-14 DIAGNOSIS — L89323 Pressure ulcer of left buttock, stage 3: Secondary | ICD-10-CM | POA: Insufficient documentation

## 2023-08-14 NOTE — Progress Notes (Signed)
Keith [name] ([id number]) [id number] Page 1 of [name] Visit Report for 08/14/2023 Arrival Information Details Patient Name: Date of Service: Keith [name] 08/14/2023 2:00 PM Medical Record Number: [id number] Patient Account Number: [id number] Date of Birth/Sex: Treating RN: May 11, 1951 (72 y.o. M) Primary Care Caillou Minus: [place], Betty Other Clinician: Referring Michele Judy: Treating Amenda Duclos/Extender: Cannon, Jennifer [place], Betty Weeks in Treatment: 3 Visit Information History Since Last Visit Added or deleted any medications: No Patient Arrived: Wheel Chair Any new allergies or adverse reactions: No Arrival Time: 14:02 Had a fall or experienced change in No Accompanied By: wife activities of daily living that may affect Transfer Assistance: None risk of falls: Patient Identification Verified: Yes Signs or symptoms of abuse/neglect since last visito No Secondary Verification Process Completed: Yes Hospitalized since last visit: No Patient Requires Transmission-Based Precautions: No Implantable device outside of the clinic excluding No Patient Has Alerts: Yes cellular tissue based products placed in the center Patient Alerts: Patient on Blood Thinner since last visit: Eliquis Has Dressing in Place as Prescribed: Yes Pain Present Now: Yes Electronic Signature(s) Signed: 08/14/2023 4:26:50 PM By: Zenaida Deed RN, BSN Entered By: Zenaida Deed on 08/14/2023 11:07:35 -------------------------------------------------------------------------------- Encounter Discharge Information Details Patient Name: Date of Service: Keith [name], Keith Oppenheim [name]. 08/14/2023 2:00 PM Medical Record Number: [id number] Patient Account Number: [id number] Date of Birth/Sex: Treating RN: [birth year] (72 y.o. Keith [name] Primary Care Elonzo Sopp: [place], Betty Other Clinician: Referring Curtez Brallier: Treating Sarahmarie Leavey/Extender: Cannon, Jennifer [place], Betty Weeks in Treatment:  3 Encounter Discharge Information Items Post Procedure Vitals Discharge Condition: Stable Temperature (F): 98.2 Ambulatory Status: Wheelchair Pulse (bpm): 96 Discharge Destination: Home Respiratory Rate (breaths/min): 18 Transportation: Other Blood Pressure (mmHg): 108/71 Accompanied By: spouse Schedule Follow-up Appointment: Yes Clinical Summary of Care: Patient Declined Notes transportation service Electronic Signature(s) Signed: 08/14/2023 4:26:50 PM By: Zenaida Deed RN, BSN Entered By: Zenaida Deed on 08/14/2023 11:44:32 Keith [name] ([id number]) [id number] Page 2 of [name] -------------------------------------------------------------------------------- Lower Extremity Assessment Details Patient Name: Date of Service: Keith [name] 08/14/2023 2:00 PM Medical Record Number: [id number] Patient Account Number: [id number] Date of Birth/Sex: Treating RN: 01-16-1951 (72 y.o. Keith [name] Primary Care Lue Dubu[place]: [place], Betty Other Clinician: Referring Katalia Choma: Treating Giannis Corpuz/Extender: Cannon, Jennifer [place], Betty Weeks in Treatment: 3 Electronic Signature(s) Signed: 08/14/2023 4:26:50 PM By: Zenaida Deed RN, BSN Entered By: Zenaida Deed on 08/14/2023 11:08:44 -------------------------------------------------------------------------------- Multi Wound Chart Details Patient Name: Date of Service: Keith [name], Keith Oppenheim [name]. 08/14/2023 2:00 PM Medical Record Number: [id number] Patient Account Number: [id number] Date of Birth/Sex: Treating RN: [birth year] (72 y.o. M) Primary Care Siearra Amberg: [place], Betty Other Clinician: Referring Demetri Goshert: Treating Cornelis Kluver/Extender: Cannon, Jennifer [place], Betty Weeks in Treatment: 3 Vital Signs Height(in): 72 Pulse(bpm): 96 Weight(lbs): 242 Blood Pressure(mmHg): 108/71 Body Mass Index(BMI): 32.8 Temperature(F): 98.2 Respiratory Rate(breaths/min): 18 [1:Photos:] Left Gluteus Sacrum Left,  Medial Gluteus Wound Location: Pressure Injury Pressure Injury Bump Wounding Event: Pressure Ulcer Pressure Ulcer Abscess Primary Etiology: Type II Diabetes, Osteoarthritis, Type II Diabetes, Osteoarthritis, Type II Diabetes, Osteoarthritis, Comorbid History: Neuropathy Neuropathy Neuropathy [name]/10/2022 [name]/16/2024 07/30/2023 Date Acquired: 3 3 2  Weeks of Treatment: Open Open Open Wound Status: No No No Wound Recurrence: 0x0x0 0x0x0 0.1x0.1x0.1 Measurements L x W x D (cm) 0 0 0.008 A (cm) : rea 0 0 0.001 Volume (cm) : 100.00% 100.00% 98.40% % Reduction in Area: 100.00% 100.00% 98.00% % Reduction in Volume: Category/Stage III Category/Stage III Full Thickness Without Exposed Classification: Support Structures None Present None Present  None Present Exudate Amount: N/A N/A Indistinct, nonvisible Wound Margin: None Present (0%) None Present (0%) None Present (0%) Granulation Amount: None Present (0%) None Present (0%) Small (1-33%) Necrotic Amount: Fat Layer (Subcutaneous Tissue): Yes Fat Layer (Subcutaneous Tissue): Yes Fascia: No Exposed Structures: Fascia: No Fascia: No Fat Layer (Subcutaneous Tissue): No Keith [name], Keith [name] ([id number]) [id number] Page 3 of [name] Tendon: No Tendon: No Tendon: No Muscle: No Muscle: No Muscle: No Joint: No Joint: No Joint: No Bone: No Bone: No Bone: No Limited to Skin Breakdown Large (67-100%) Large (67-100%) Large (67-100%) Epithelialization: Scarring: No No Abnormalities Noted No Abnormalities Noted Periwound Skin Texture: No Abnormalities Noted No Abnormalities Noted No Abnormalities Noted Periwound Skin Moisture: No Abnormalities Noted No Abnormalities Noted No Abnormalities Noted Periwound Skin Color: No Abnormality No Abnormality No Abnormality Temperature: N/A N/A Yes Tenderness on Palpation: Treatment Notes Electronic Signature(s) Signed: 08/14/2023 2:23:03 PM By: Duanne Guess MD  FACS Entered By: Duanne Guess on 08/14/2023 11:23:03 -------------------------------------------------------------------------------- Multi-Disciplinary Care Plan Details Patient Name: Date of Service: Keith [name], Keith Oppenheim [name]. 08/14/2023 2:00 PM Medical Record Number: [id number] Patient Account Number: [id number] Date of Birth/Sex: Treating RN: 11/16/1950 (72 y.o. Keith [name] Primary Care Treyvonne Tata: [place], Betty Other Clinician: Referring Kymberli Wiegand: Treating Rakesh Dutko/Extender: Cannon, Jennifer [place], Betty Weeks in Treatment: 3 Multidisciplinary Care Plan reviewed with physician Active Inactive Wound/Skin Impairment Nursing Diagnoses: Impaired tissue integrity Goals: Patient/caregiver will verbalize understanding of skin care regimen Date Initiated: 07/22/2023 Target Resolution Date: 08/21/2023 Goal Status: Active Ulcer/skin breakdown will have a volume reduction of 30% by week 4 Date Initiated: 08/14/2023 Target Resolution Date: 08/21/2023 Goal Status: Active Interventions: Assess ulceration(s) every visit Treatment Activities: Skin care regimen initiated : 07/22/2023 Notes: Electronic Signature(s) Signed: 08/14/2023 4:26:50 PM By: Zenaida Deed RN, BSN Entered By: Zenaida Deed on 08/14/2023 10:57:08 Pain Assessment Details -------------------------------------------------------------------------------- Keith [name] ([id number]) [id number] Page 4 of [name] Patient Name: Date of Service: Keith [name] 08/14/2023 2:00 PM Medical Record Number: [id number] Patient Account Number: [id number] Date of Birth/Sex: Treating RN: Mar 06, 1951 (72 y.o. M) Primary Care Freeda Spivey: [place], Betty Other Clinician: Referring Ellasyn Swilling: Treating Tara Wich/Extender: Cannon, Jennifer [place], Betty Weeks in Treatment: 3 Active Problems Location of Pain Severity and Description of Pain Patient Has Paino Yes Site Locations Pain Location: Generalized  Pain With Dressing Change: No Duration of the Pain. Constant / Intermittento Intermittent Rate the pain. Current Pain Level: 8 Worst Pain Level: 10 Least Pain Level: 0 Tolerable Pain Level: 4 Character of Pain Describe the Pain: Aching, Sharp Pain Management and Medication Current Pain Management: Medication: Yes Is the Current Pain Management Adequate: Adequate How does your wound impact your activities of daily livingo Sleep: Yes Bathing: No Appetite: No Relationship With Others: No Bladder Continence: No Emotions: Yes Bowel Continence: No Work: No Toileting: No Drive: No Dressing: No Hobbies: Yes Notes reports arthritis and chronic back pain Electronic Signature(s) Signed: 08/14/2023 4:26:50 PM By: Zenaida Deed RN, BSN Entered By: Zenaida Deed on 08/14/2023 11:08:36 -------------------------------------------------------------------------------- Patient/Caregiver Education Details Patient Name: Date of Service: Keith [name] 11/6/2024andnbsp2:00 PM Medical Record Number: [id number] Patient Account Number: [id number] Date of Birth/Gender: Treating RN: 07-18-1951 (72 y.o. Keith [name] Primary Care Physician: [place], Betty Other Clinician: Referring Physician: Treating Physician/Extender: Cannon, Jennifer [place], Betty Weeks in Treatment: 3 Education Assessment Education Provided To: Patient Keith [name], Keith [name] ([id number]) [id number] Page 5 of [name] Education Topics Provided Wound/Skin Impairment: Methods: Explain/Verbal Responses: Reinforcements needed, State content correctly Electronic Signature(s) Signed:  08/14/2023 4:26:50 PM By: Zenaida Deed RN, BSN Entered By: Zenaida Deed on 08/14/2023 10:57:24 -------------------------------------------------------------------------------- Wound Assessment Details Patient Name: Date of Service: Keith School [name]. 08/14/2023 2:00 PM Medical Record Number:  [id number] Patient Account Number: [id number] Date of Birth/Sex: Treating RN: 04-03-1951 (72 y.o. Keith [name] Primary Care Haden Cavenaugh: [place], Betty Other Clinician: Referring Lesleigh Hughson: Treating Haevyn Ury/Extender: Cannon, Jennifer [place], Betty Weeks in Treatment: 3 Wound Status Wound Number: 1 Primary Etiology: Pressure Ulcer Wound Location: Left Gluteus Wound Status: Open Wounding Event: Pressure Injury Comorbid History: Type II Diabetes, Osteoarthritis, Neuropathy Date Acquired: [name]/10/2022 Weeks Of Treatment: 3 Clustered Wound: No Photos Wound Measurements Length: (cm) Width: (cm) Depth: (cm) Area: (cm) Volume: (cm) 0 % Reduction in Area: 100% 0 % Reduction in Volume: 100% 0 Epithelialization: Large (67-100%) 0 Tunneling: No 0 Undermining: No Wound Description Classification: Category/Stage III Exudate Amount: None Present Foul Odor After Cleansing: No Slough/Fibrino No Wound Bed Granulation Amount: None Present (0%) Exposed Structure Necrotic Amount: None Present (0%) Fascia Exposed: No Fat Layer (Subcutaneous Tissue) Exposed: Yes Tendon Exposed: No Muscle Exposed: No Joint Exposed: No Bone Exposed: No [address] Keith [name] ([id number]) [id number] Page 6 of [name] No Abnormalities Noted: Yes No Abnormalities Noted: Yes Moisture Temperature / Pain No Abnormalities Noted: Yes Temperature: No Abnormality Electronic Signature(s) Signed: 08/14/2023 4:26:50 PM By: Zenaida Deed RN, BSN Entered By: Zenaida Deed on 08/14/2023 11:17:07 -------------------------------------------------------------------------------- Wound Assessment Details Patient Name: Date of Service: Keith [name], Keith Oppenheim [name]. 08/14/2023 2:00 PM Medical Record Number: [id number] Patient Account Number: [id number] Date of Birth/Sex: Treating RN: [birth year] (72 y.o. Keith [name] Primary Care Itamar Mcgowan: [place], Betty Other  Clinician: Referring Linzie Boursiquot: Treating Tova Vater/Extender: Cannon, Jennifer [place], Betty Weeks in Treatment: 3 Wound Status Wound Number: 2 Primary Etiology: Pressure Ulcer Wound Location: Sacrum Wound Status: Open Wounding Event: Pressure Injury Comorbid History: Type II Diabetes, Osteoarthritis, Neuropathy Date Acquired: [name]/16/2024 Weeks Of Treatment: 3 Clustered Wound: No Photos Wound Measurements Length: (cm) 0.1 Width: (cm) 0.1 Depth: (cm) 0.1 Area: (cm) 0.008 Volume: (cm) 0.001 % Reduction in Area: 98.5% % Reduction in Volume: 98.2% Epithelialization: Large (67-100%) Tunneling: No Undermining: No Wound Description Classification: Category/Stage III Exudate Amount: None Present Foul Odor After Cleansing: No Slough/Fibrino No Wound Bed Granulation Amount: None Present (0%) Exposed Structure Necrotic Amount: None Present (0%) Fascia Exposed: No Fat Layer (Subcutaneous Tissue) Exposed: Yes Tendon Exposed: No Muscle Exposed: No Joint Exposed: No Bone Exposed: No Periwound Skin Texture Texture Color No Abnormalities Noted: Yes No Abnormalities Noted: Yes Moisture Temperature / Pain Keith [name] ([id number]) [id number] Page 7 of [name] No Abnormalities Noted: Yes Temperature: No Abnormality Treatment Notes Wound #2 (Sacrum) Cleanser Soap and Water Discharge Instruction: May shower and wash wound with dial antibacterial soap and water prior to dressing change. Peri-Wound Care Topical Primary Dressing Maxorb Extra Ag+ Alginate Dressing, 2x2 (in/in) Discharge Instruction: Apply to wound bed as instructed Secondary Dressing ALLEVYN Gentle Border, 3x3 (in/in) Discharge Instruction: Apply over primary dressing as directed. Secured With Compression Wrap Compression Stockings Facilities manager) Signed: 08/14/2023 4:26:50 PM By: Zenaida Deed RN, BSN Entered By: Zenaida Deed on 08/14/2023  11:25:30 -------------------------------------------------------------------------------- Wound Assessment Details Patient Name: Date of Service: Keith School [name]. 08/14/2023 2:00 PM Medical Record Number: [id number] Patient Account Number: [id number] Date of Birth/Sex: Treating RN: [birth year] (72 y.o. Keith [name] Primary Care Copper Basnett: [place], Betty Other Clinician: Referring Serayah Yazdani: Treating Shelia Magallon/Extender: Cannon, Jennifer [place], Betty Weeks in Treatment:  3 Wound Status Wound Number: 3 Primary Etiology: Abscess Wound Location: Left, Medial Gluteus Wound Status: Open Wounding Event: Bump Comorbid History: Type II Diabetes, Osteoarthritis, Neuropathy Date Acquired: 07/30/2023 Weeks Of Treatment: 2 Clustered Wound: No Photos Wound Measurements Length: (cm) 0.1 Width: (cm) 0.1 Keith [name], Keith [name] ([id number]) Depth: (cm) 0.1 Area: (cm) 0.008 Volume: (cm) 0.001 % Reduction in Area: 98.4% % Reduction in Volume: 98% [id number] Page 8 of [name] Epithelialization: Large (67-100%) Tunneling: No Undermining: No Wound Description Classification: Full Thickness Without Exposed Support Structures Wound Margin: Indistinct, nonvisible Exudate Amount: None Present Foul Odor After Cleansing: No Slough/Fibrino No Wound Bed Granulation Amount: None Present (0%) Exposed Structure Necrotic Amount: Small (1-33%) Fascia Exposed: No Necrotic Quality: Adherent Slough Fat Layer (Subcutaneous Tissue) Exposed: No Tendon Exposed: No Muscle Exposed: No Joint Exposed: No Bone Exposed: No Limited to Skin Breakdown Periwound Skin Texture Texture Color No Abnormalities Noted: Yes No Abnormalities Noted: Yes Moisture Temperature / Pain No Abnormalities Noted: Yes Temperature: No Abnormality Tenderness on Palpation: Yes Treatment Notes Wound #3 (Gluteus) Wound Laterality: Left, Medial Cleanser Soap and Water Discharge Instruction: May shower and wash  wound with dial antibacterial soap and water prior to dressing change. Peri-Wound Care Topical Primary Dressing Maxorb Extra Ag+ Alginate Dressing, 2x2 (in/in) Discharge Instruction: Apply to wound bed as instructed Secondary Dressing ALLEVYN Gentle Border, 3x3 (in/in) Discharge Instruction: Apply over primary dressing as directed. Secured With Compression Wrap Compression Stockings Facilities manager) Signed: 08/14/2023 4:26:50 PM By: Zenaida Deed RN, BSN Entered By: Zenaida Deed on 08/14/2023 11:18:18 -------------------------------------------------------------------------------- Vitals Details Patient Name: Date of Service: Keith [name], Keith Oppenheim [name]. 08/14/2023 2:00 PM Medical Record Number: [id number] Patient Account Number: [id number] Date of Birth/Sex: Treating RN: 04/17/1951 (72 y.o. M) Primary Care Mariateresa Batra: [place], Betty Other Clinician: Referring Leisl Spurrier: Treating Ruchy Wildrick/Extender: Cannon, Jennifer [place], Betty Weeks in Treatment: 3 [place], [name] ([id number]) [id number] Page [name] of [name] Vital Signs Time Taken: 02:03 Temperature (F): 98.2 Height (in): 72 Pulse (bpm): 96 Weight (lbs): 242 Respiratory Rate (breaths/min): 18 Body Mass Index (BMI): 32.8 Blood Pressure (mmHg): 108/71 Reference Range: 80 - 120 mg / dl Electronic Signature(s) Signed: 08/14/2023 4:26:50 PM By: Zenaida Deed RN, BSN Entered By: Zenaida Deed on 08/14/2023 11:07:39

## 2023-08-14 NOTE — Progress Notes (Addendum)
Keith [name] ([id number]) [id number] Page 1 of 9 Visit Report for 08/14/2023 Chief Complaint Document Details Patient Name: Date of Service: Keith [name] 08/14/2023 2:00 PM Medical Record Number: [id number] Patient Account Number: [id number] Date of Birth/Sex: Treating RN: [birth year]Keith [name] (72 y.o. M) Primary Care Provider: [place], Keith [name] Other Clinician: Referring Provider: Treating Provider/Extender: Calton Harshfield Keith [name], Keith [name] in Treatment: 3 Information Obtained from: Patient Chief Complaint Patient is at the clinic for treatment of open pressure ulcers Electronic Signature(s) Signed: 08/14/2023 2:28:37 PM By: Duanne Guess MD FACS Entered By: Duanne Guess on 08/14/2023 14:28:37 -------------------------------------------------------------------------------- Debridement Details Patient Name: Date of Service: Keith [name], Keith [name]. 08/14/2023 2:00 PM Medical Record Number: [id number] Patient Account Number: [id number] Date of Birth/Sex: Treating RN: [birth year] Keith [name] (71 y.o. Keith [name] Primary Care Provider: [place], Keith [name] Other Clinician: Referring Provider: Treating Provider/Extender: Keith [name] Keith [name], Keith [name] in Treatment: 3 Debridement Performed for Assessment: Wound #3 Left,Medial Gluteus Performed By: Physician Duanne Guess, MD The following information was scribed by: Zenaida Deed The information was scribed for: Duanne Guess Debridement Type: Debridement Level of Consciousness (Pre-procedure): Awake and Alert Pre-procedure Verification/Time Out Yes - 14:25 Taken: Start Time: 14:26 Pain Control: Lidocaine 4% [name] opical Solution Percent of Wound Bed Debrided: 100% [name] Area Debrided (cm): otal 0.03 Tissue and other material debrided: Viable, Non-Viable, Slough, Subcutaneous, Slough Level: Skin/Subcutaneous Tissue Debridement Description: Excisional Instrument: Curette Bleeding: Minimum Hemostasis  Achieved: Pressure Procedural Pain: 0 Post Procedural Pain: 0 Response to Treatment: Procedure was tolerated well Level of Consciousness (Post- Awake and Alert procedure): Post Debridement Measurements of Total Wound Length: (cm) 0.2 Width: (cm) 0.2 Depth: (cm) 0.1 Volume: (cm) 0.003 Character of Wound/Ulcer Post Debridement: Improved Keith [name] ([id number]) [id number] Page 2 of 9 Post Procedure Diagnosis Same as Pre-procedure Electronic Signature(s) Signed: 08/14/2023 3:01:49 PM By: Duanne Guess MD FACS Signed: 08/14/2023 4:26:50 PM By: Zenaida Deed RN, BSN Entered By: Zenaida Deed on 08/14/2023 14:27:32 -------------------------------------------------------------------------------- HPI Details Patient Name: Date of Service: Keith [name], Keith [name]. 08/14/2023 2:00 PM Medical Record Number: [id number] Patient Account Number: [id number] Date of Birth/Sex: Treating RN: November [birth year], Keith [name] (72 y.o. M) Primary Care Provider: [place], Keith [name] Other Clinician: Referring Provider: Treating Provider/Extender: Keith [name] Keith [name], Keith [name] in Treatment: 3 History of Present Illness HPI Description: ADMISSION 07/22/2023 This is a 72 year old type II diabetic (last hemoglobin A1c 5.5% in March 2024). He has spinal stenosis with cord compression and thoracic myelopathy. He has CKD stage III. He is nonambulatory and spends most of his time in either wheelchair or in bed. He recently saw his primary care provider at the end of September and was noted to have a pressure injury of his left buttock. The provider was unable to directly evaluated because he cannot stand long enough for an examination but his wife provided a picture and based on that photograph, it was assessed as a stage II measuring 1 to 1.5 inches. He was advised to increase his protein intake, avoid pressure on the affected area, apply Desitin, and was referred to the wound care center for  further evaluation and management. 07/30/2023: Both of the existing pressure ulcers are smaller today. They are quite clean without any slough or eschar accumulation. He has a small abscess on his right medial buttock. He says that he periodically gets boils in this area. 08/14/2023: The abscess on his right medial buttock is healed. The sacral ulcer is almost completely closed and is quite superficial. The left ischial ulcer  is smaller with just a little bit of rubbery slough on the surface. Electronic Signature(s) Signed: 08/14/2023 2:29:49 PM By: Duanne Guess MD FACS Entered By: Duanne Guess on 08/14/2023 14:29:49 -------------------------------------------------------------------------------- Physical Exam Details Patient Name: Date of Service: Keith [name] [name]. 08/14/2023 2:00 PM Medical Record Number: [id number] Patient Account Number: [id number] Date of Birth/Sex: Treating RN: [birth year]Keith [name] (72 y.o. M) Primary Care Provider: [place], Keith [name] Other Clinician: Referring Provider: Treating Provider/Extender: Keith [name] Keith [name], Keith [name] in Treatment: 3 Constitutional . . . . no acute distress. Respiratory Normal work of breathing on room air.. Notes 08/14/2023: The abscess on his right medial buttock is healed. The sacral ulcer is almost completely closed and is quite superficial. The left ischial ulcer is smaller with just a little bit of rubbery slough on the surface. Keith [name] ([id number]) [id number] Page 3 of 9 Electronic Signature(s) Signed: 08/14/2023 2:30:45 PM By: Duanne Guess MD FACS Entered By: Duanne Guess on 08/14/2023 14:30:44 -------------------------------------------------------------------------------- Physician Orders Details Patient Name: Date of Service: Keith [name], Keith [name]. 08/14/2023 2:00 PM Medical Record Number: [id number] Patient Account Number: [id number] Date of Birth/Sex: Treating RN: [birth year], Keith [name]  (72 y.o. Keith [name], Bonita Quin Primary Care Provider: [place], Keith [name] Other Clinician: Referring Provider: Treating Provider/Extender: Keith [name] Keith [name], Keith [name] in Treatment: 3 The following information was scribed by: Zenaida Deed The information was scribed for: Duanne Guess Verbal / Phone Orders: No Diagnosis Coding ICD-10 Coding Code Description [phone] Pressure ulcer of left buttock, stage 3 L89.153 Pressure ulcer of sacral region, stage 3 L02.31 Cutaneous abscess of buttock E11.622 Type 2 diabetes mellitus with other skin ulcer G95.20 Unspecified cord compression N18.30 Chronic kidney disease, stage 3 unspecified Follow-up Appointments ppointment in 2 [name]. - Dr. Lady Gary RM 1 or 3 ******HOYER*** Return A Thursday 11/21 @ 09:30 am Anesthetic (In clinic) Topical Lidocaine 4% applied to wound bed Bathing/ Shower/ Hygiene May shower and wash wound with soap and water. Off-Loading Other: - Will order Roho Cushion. While in wheelchair, keep shifting weight . Turn in bed at least every 2 hours Additional Orders / Instructions Follow Nutritious Diet Other: - Goal is 70-100g protein per day Wound Treatment Wound #1 - Gluteus Wound Laterality: Left Cleanser: Soap and Water 1 x Per Day/30 Days Discharge Instructions: May shower and wash wound with dial antibacterial soap and water prior to dressing change. Prim Dressing: Maxorb Extra Ag+ Alginate Dressing, 2x2 (in/in) (Generic) 1 x Per Day/30 Days ary Discharge Instructions: Apply to wound bed as instructed Secondary Dressing: ALLEVYN Gentle Border, 3x3 (in/in) (Generic) 1 x Per Day/30 Days Discharge Instructions: Apply over primary dressing as directed. Wound #2 - Sacrum Cleanser: Soap and Water 1 x Per Day/30 Days Discharge Instructions: May shower and wash wound with dial antibacterial soap and water prior to dressing change. Prim Dressing: Maxorb Extra Ag+ Alginate Dressing, 2x2 (in/in) (Generic) 1 x Per Day/30  Days ary Discharge Instructions: Apply to wound bed as instructed Secondary Dressing: ALLEVYN Gentle Border, 3x3 (in/in) (Generic) 1 x Per Day/30 Days Discharge Instructions: Apply over primary dressing as directed. Keith [name] ([id number]) [id number] Page 4 of 9 Wound #3 - Gluteus Wound Laterality: Left, Medial Cleanser: Soap and Water 1 x Per Day/30 Days Discharge Instructions: May shower and wash wound with dial antibacterial soap and water prior to dressing change. Prim Dressing: Maxorb Extra Ag+ Alginate Dressing, 2x2 (in/in) (Generic) 1 x Per Day/30 Days ary Discharge Instructions: Apply to wound bed as instructed Secondary Dressing: ALLEVYN Gentle Border, 3x3 (  in/in) (Generic) 1 x Per Day/30 Days Discharge Instructions: Apply over primary dressing as directed. Electronic Signature(s) Signed: 08/14/2023 3:01:49 PM By: Duanne Guess MD FACS Entered By: Duanne Guess on 08/14/2023 14:32:27 -------------------------------------------------------------------------------- Problem List Details Patient Name: Date of Service: Keith [name], Keith [name]. 08/14/2023 2:00 PM Medical Record Number: [id number] Patient Account Number: [id number] Date of Birth/Sex: Treating RN: [birth year]Keith [name] (72 y.o. Keith [name] Primary Care Provider: [place], Keith [name] Other Clinician: Referring Provider: Treating Provider/Extender: Keith [name] Keith [name], Keith [name] in Treatment: 3 Active Problems ICD-10 Encounter Code Description Active Date MDM Diagnosis [phone] Pressure ulcer of left buttock, stage 3 07/22/2023 No Yes L89.153 Pressure ulcer of sacral region, stage 3 07/22/2023 No Yes L02.31 Cutaneous abscess of buttock 07/30/2023 No Yes E11.622 Type 2 diabetes mellitus with other skin ulcer 07/22/2023 No Yes G95.20 Unspecified cord compression 07/22/2023 No Yes N18.30 Chronic kidney disease, stage 3 unspecified 07/22/2023 No Yes Inactive Problems Resolved  Problems Electronic Signature(s) Signed: 08/14/2023 2:22:47 PM By: Duanne Guess MD FACS Entered By: Duanne Guess on 08/14/2023 14:22:46 Keith [name] ([id number]) [id number] Page 5 of 9 -------------------------------------------------------------------------------- Progress Note Details Patient Name: Date of Service: Keith [name] 08/14/2023 2:00 PM Medical Record Number: [id number] Patient Account Number: [id number] Date of Birth/Sex: Treating RN: Keith [name], Keith [name] (72 y.o. M) Primary Care Provider: [place], Keith [name] Other Clinician: Referring Provider: Treating Provider/Extender: Keith [name] Keith [name], Keith [name] in Treatment: 3 Subjective Chief Complaint Information obtained from Patient Patient is at the clinic for treatment of open pressure ulcers History of Present Illness (HPI) ADMISSION 07/22/2023 This is a 72 year old type II diabetic (last hemoglobin A1c 5.5% in March 2024). He has spinal stenosis with cord compression and thoracic myelopathy. He has CKD stage III. He is nonambulatory and spends most of his time in either wheelchair or in bed. He recently saw his primary care provider at the end of September and was noted to have a pressure injury of his left buttock. The provider was unable to directly evaluated because he cannot stand long enough for an examination but his wife provided a picture and based on that photograph, it was assessed as a stage II measuring 1 to 1.5 inches. He was advised to increase his protein intake, avoid pressure on the affected area, apply Desitin, and was referred to the wound care center for further evaluation and management. 07/30/2023: Both of the existing pressure ulcers are smaller today. They are quite clean without any slough or eschar accumulation. He has a small abscess on his right medial buttock. He says that he periodically gets boils in this area. 08/14/2023: The abscess on his right medial  buttock is healed. The sacral ulcer is almost completely closed and is quite superficial. The left ischial ulcer is smaller with just a little bit of rubbery slough on the surface. Patient History Information obtained from Patient, Chart. Family History Unknown History. Social History Former smoker - ended on 10/08/1993, Marital Status - Married, Alcohol Use - Rarely, Drug Use - No History, Caffeine Use - Moderate. Medical History Endocrine Patient has history of Type II Diabetes - pt. states he is not Diabetic Musculoskeletal Patient has history of Osteoarthritis Neurologic Patient has history of Neuropathy - Diabetic Peripheral Neuropathy Medical A Surgical History Notes nd Respiratory Hx: Community Aquired Pneumonia; Loculated Pleural Effusion; Chronic Respiratory Failure with Hypoxia Gastrointestinal Hx: Drug induced Constipation;GERD Endocrine Last Hem A1C: 5.1% (07/01/23) Genitourinary Chronic Kidney Disease Stage III Musculoskeletal Hx: Closed Right Trimalleolar Fracture;Closed Right Ankle Fracture; Bilateral Leg  weakness Neurologic Hx: Spinal Cord Compression;Thoracic Myelopathy Objective Constitutional no acute distress. Vitals Time Taken: 2:03 AM, Height: 72 in, Weight: 242 lbs, BMI: 32.8, Temperature: 98.2 F, Pulse: 96 bpm, Respiratory Rate: 18 breaths/min, Blood Pressure: Keith [name] ([id number]) [id number] Page 6 of 9 108/71 mmHg. Respiratory Normal work of breathing on room air.. General Notes: 08/14/2023: The abscess on his right medial buttock is healed. The sacral ulcer is almost completely closed and is quite superficial. The left ischial ulcer is smaller with just a little bit of rubbery slough on the surface. Integumentary (Hair, Skin) Wound #1 status is Open. Original cause of wound was Pressure Injury. The date acquired was: 06/09/2023. The wound has been in treatment 3 [name]. The wound is located on the Left Gluteus. The  wound measures 0cm length x 0cm width x 0cm depth; 0cm^2 area and 0cm^3 volume. There is Fat Layer (Subcutaneous Tissue) exposed. There is no tunneling or undermining noted. There is a none present amount of drainage noted. There is no granulation within the wound bed. There is no necrotic tissue within the wound bed. The periwound skin appearance had no abnormalities noted for texture. The periwound skin appearance had no abnormalities noted for moisture. The periwound skin appearance had no abnormalities noted for color. Periwound temperature was noted as No Abnormality. Wound #2 status is Open. Original cause of wound was Pressure Injury. The date acquired was: 06/24/2023. The wound has been in treatment 3 [name]. The wound is located on the Sacrum. The wound measures 0.1cm length x 0.1cm width x 0.1cm depth; 0.008cm^2 area and 0.001cm^3 volume. There is Fat Layer (Subcutaneous Tissue) exposed. There is no tunneling or undermining noted. There is a none present amount of drainage noted. There is no granulation within the wound bed. There is no necrotic tissue within the wound bed. The periwound skin appearance had no abnormalities noted for texture. The periwound skin appearance had no abnormalities noted for moisture. The periwound skin appearance had no abnormalities noted for color. Periwound temperature was noted as No Abnormality. Wound #3 status is Open. Original cause of wound was Bump. The date acquired was: 07/30/2023. The wound has been in treatment 2 [name]. The wound is located on the Left,Medial Gluteus. The wound measures 0.1cm length x 0.1cm width x 0.1cm depth; 0.008cm^2 area and 0.001cm^3 volume. The wound is limited to skin breakdown. There is no tunneling or undermining noted. There is a none present amount of drainage noted. The wound margin is indistinct and nonvisible. There is no granulation within the wound bed. There is a small (1-33%) amount of necrotic tissue within the wound  bed including Adherent Slough. The periwound skin appearance had no abnormalities noted for texture. The periwound skin appearance had no abnormalities noted for moisture. The periwound skin appearance had no abnormalities noted for color. Periwound temperature was noted as No Abnormality. The periwound has tenderness on palpation. Assessment Active Problems ICD-10 Pressure ulcer of left buttock, stage 3 Pressure ulcer of sacral region, stage 3 Cutaneous abscess of buttock Type 2 diabetes mellitus with other skin ulcer Unspecified cord compression Chronic kidney disease, stage 3 unspecified Procedures Wound #3 Pre-procedure diagnosis of Wound #3 is an Abscess located on the Left,Medial Gluteus . There was a Excisional Skin/Subcutaneous Tissue Debridement with a total area of 0.03 sq cm performed by Duanne Guess, MD. With the following instrument(s): Curette to remove Viable and Non-Viable tissue/material. Material removed includes Subcutaneous Tissue and Slough and after achieving pain control using Lidocaine 4% [name]  opical Solution. No specimens were taken. A time out was conducted at 14:25, prior to the start of the procedure. A Minimum amount of bleeding was controlled with Pressure. The procedure was tolerated well with a pain level of 0 throughout and a pain level of 0 following the procedure. Post Debridement Measurements: 0.2cm length x 0.2cm width x 0.1cm depth; 0.003cm^3 volume. Character of Wound/Ulcer Post Debridement is improved. Post procedure Diagnosis Wound #3: Same as Pre-Procedure Plan Follow-up Appointments: Return Appointment in 2 [name]. - Dr. Lady Gary RM 1 or 3 ******HOYER*** Thursday 11/21 @ 09:30 am Anesthetic: (In clinic) Topical Lidocaine 4% applied to wound bed Bathing/ Shower/ Hygiene: May shower and wash wound with soap and water. Off-Loading: Other: - Will order Roho Cushion. While in wheelchair, keep shifting weight . Turn in bed at least every 2  hours Additional Orders / Instructions: Follow Nutritious Diet Other: - Goal is 70-100g protein per day WOUND #1: - Gluteus Wound Laterality: Left Cleanser: Soap and Water 1 x Per Day/30 Days Discharge Instructions: May shower and wash wound with dial antibacterial soap and water prior to dressing change. Prim Dressing: Maxorb Extra Ag+ Alginate Dressing, 2x2 (in/in) (Generic) 1 x Per Day/30 Days ary Discharge Instructions: Apply to wound bed as instructed Secondary Dressing: ALLEVYN Gentle Border, 3x3 (in/in) (Generic) 1 x Per Day/30 Days ALVAH, LAGROW [name] ([id number]) [id number] Page 7 of 9 Discharge Instructions: Apply over primary dressing as directed. WOUND #2: - Sacrum Wound Laterality: Cleanser: Soap and Water 1 x Per Day/30 Days Discharge Instructions: May shower and wash wound with dial antibacterial soap and water prior to dressing change. Prim Dressing: Maxorb Extra Ag+ Alginate Dressing, 2x2 (in/in) (Generic) 1 x Per Day/30 Days ary Discharge Instructions: Apply to wound bed as instructed Secondary Dressing: ALLEVYN Gentle Border, 3x3 (in/in) (Generic) 1 x Per Day/30 Days Discharge Instructions: Apply over primary dressing as directed. WOUND #3: - Gluteus Wound Laterality: Left, Medial Cleanser: Soap and Water 1 x Per Day/30 Days Discharge Instructions: May shower and wash wound with dial antibacterial soap and water prior to dressing change. Prim Dressing: Maxorb Extra Ag+ Alginate Dressing, 2x2 (in/in) (Generic) 1 x Per Day/30 Days ary Discharge Instructions: Apply to wound bed as instructed Secondary Dressing: ALLEVYN Gentle Border, 3x3 (in/in) (Generic) 1 x Per Day/30 Days Discharge Instructions: Apply over primary dressing as directed. 08/14/2023: The abscess on his right medial buttock is healed. The sacral ulcer is almost completely closed and is quite superficial. The left ischial ulcer is smaller with just a little bit of rubbery slough on  the surface. The sacral wound did not require any debridement. I used a curette to debride slough and subcutaneous tissue from the left ischial ulcer. We will continue silver alginate and foam border dressings to each of his wounds. They are doing a good job of offloading at home and his nutritional status appears to be adequate. He will follow-up in 2 [name]. Electronic Signature(s) Signed: 08/14/2023 2:35:24 PM By: Duanne Guess MD FACS Entered By: Duanne Guess on 08/14/2023 14:35:23 -------------------------------------------------------------------------------- HxROS Details Patient Name: Date of Service: Keith [name], Keith [name]. 08/14/2023 2:00 PM Medical Record Number: [id number] Patient Account Number: [id number] Date of Birth/Sex: Treating RN: 1951-10-08 (72 y.o. M) Primary Care Provider: [place], Keith [name] Other Clinician: Referring Provider: Treating Provider/Extender: Keith [name] Keith [name], Keith [name] in Treatment: 3 Information Obtained From Patient Chart Respiratory Medical History: Past Medical History Notes: Hx: Community Aquired Pneumonia; Loculated Pleural Effusion; Chronic Respiratory Failure with Hypoxia Gastrointestinal Medical History:  Past Medical History Notes: Hx: Drug induced Constipation;GERD Endocrine Medical History: Positive for: Type II Diabetes - pt. states he is not Diabetic Past Medical History Notes: Last Hem A1C: 5.1% (07/01/23) Genitourinary Medical History: Past Medical History Notes: Chronic Kidney Disease Stage III Musculoskeletal Medical HistoryOUSMANE, SEEMAN ([id number]) [id number] Page 8 of 9 Positive for: Osteoarthritis Past Medical History Notes: Hx: Closed Right Trimalleolar Fracture;Closed Right Ankle Fracture; Bilateral Leg weakness Neurologic Medical History: Positive for: Neuropathy - Diabetic Peripheral Neuropathy Past Medical History Notes: Hx: Spinal Cord Compression;Thoracic  Myelopathy Immunizations Pneumococcal Vaccine: Received Pneumococcal Vaccination: Yes Received Pneumococcal Vaccination On or After 60th Birthday: Yes Implantable Devices No devices added Family and Social History Unknown History: Yes; Former smoker - ended on 10/08/1993; Marital Status - Married; Alcohol Use: Rarely; Drug Use: No History; Caffeine Use: Moderate; Financial Concerns: No; Food, Clothing or Shelter Needs: No; Support System Lacking: No; Transportation Concerns: No Electronic Signature(s) Signed: 08/14/2023 3:01:49 PM By: Duanne Guess MD FACS Entered By: Duanne Guess on 08/14/2023 14:30:13 -------------------------------------------------------------------------------- SuperBill Details Patient Name: Date of Service: Keith [name], Keith [name]. 08/14/2023 Medical Record Number: [id number] Patient Account Number: [id number] Date of Birth/Sex: Treating RN: [birth year], Keith [name] (72 y.o. M) Primary Care Provider: [place], Keith [name] Other Clinician: Referring Provider: Treating Provider/Extender: Oasis Goehring Keith [name], Keith [name] in Treatment: 3 Diagnosis Coding ICD-10 Codes Code Description [phone] Pressure ulcer of left buttock, stage 3 L89.153 Pressure ulcer of sacral region, stage 3 L02.31 Cutaneous abscess of buttock E11.622 Type 2 diabetes mellitus with other skin ulcer G95.20 Unspecified cord compression N18.30 Chronic kidney disease, stage 3 unspecified Facility Procedures : CPT4 Code: [id number] Description: 11042 - DEB SUBQ TISSUE 20 SQ CM/< ICD-10 Diagnosis Description L89.323 Pressure ulcer of left buttock, stage 3 Modifier: Quantity: 1 Physician Procedures : CPT4 Code Description Modifier [id number] - WC PHYS LEVEL 3 - EST PT ICD-10 Diagnosis Description L89.323 Pressure ulcer of left buttock, stage 3 L89.153 Pressure ulcer of sacral region, stage 3 E11.622 Type 2 diabetes mellitus with other skin ulcer  G95.20 Unspecified cord compression Keith [name]  ([id number]) [id number] Quantity: 1 Page 9 of 9 : [id number] 11042 - WC PHYS SUBQ TISS 20 SQ CM 1 ICD-10 Diagnosis Description L89.323 Pressure ulcer of left buttock, stage 3 Quantity: Electronic Signature(s) Signed: 08/14/2023 2:46:13 PM By: Duanne Guess MD FACS Entered By: Duanne Guess on 08/14/2023 14:46:13

## 2023-08-15 ENCOUNTER — Other Ambulatory Visit: Payer: Self-pay | Admitting: Family Medicine

## 2023-08-15 NOTE — Telephone Encounter (Signed)
Prescription Request  08/15/2023  LOV: 07/01/2023  What is the name of the medication or equipment? DULoxetine (CYMBALTA) 60 MG capsule  Have you contacted your pharmacy to request a refill? Yes   Which pharmacy would you like this sent to?  [facility] - [place], [place] - [address] [place] [address] Phone: [phone] Fax: [phone]    Patient notified that their request is being sent to the clinical staff for review and that they should receive a response within 2 business days.   Please advise at Mobile [phone] (mobile)

## 2023-08-16 NOTE — Addendum Note (Signed)
Addended by: Kathreen Devoid on: 08/16/2023 06:58 AM   Modules accepted: Orders

## 2023-08-29 ENCOUNTER — Encounter (HOSPITAL_BASED_OUTPATIENT_CLINIC_OR_DEPARTMENT_OTHER): Payer: Medicare Other | Admitting: General Surgery

## 2023-08-29 DIAGNOSIS — E11622 Type 2 diabetes mellitus with other skin ulcer: Secondary | ICD-10-CM | POA: Diagnosis not present

## 2023-08-29 NOTE — Progress Notes (Signed)
[name] ([id number]) [id number] Page 1 of 7 Visit Report for 08/29/2023 Arrival Information Details Patient Name: Date of Service: Keith [name] 08/29/2023 9:30 A M Medical Record Number: [id number] Patient Account Number: [id number] Date of Birth/Sex: Treating RN: [birth year] (72 y.o. M) Primary Care Costantino Kohlbeck: [place], Betty Other Clinician: Referring Sonam Wandel: Treating Oakes Mccready/Extender: Cannon, Jennifer [place], Betty Weeks in Treatment: 5 Visit Information History Since Last Visit Added or deleted any medications: No Patient Arrived: Wheel Chair Any new allergies or adverse reactions: No Arrival Time: 09:48 Had a fall or experienced change in No Accompanied By: self activities of daily living that may affect Transfer Assistance: None risk of falls: Patient Identification Verified: Yes Signs or symptoms of abuse/neglect since last visito No Secondary Verification Process Completed: Yes Hospitalized since last visit: No Patient Requires Transmission-Based Precautions: No Implantable device outside of the clinic excluding No Patient Has Alerts: Yes cellular tissue based products placed in the center Patient Alerts: Patient on Blood Thinner since last visit: Eliquis Pain Present Now: Yes Electronic Signature(s) Signed: 08/29/2023 1:49:04 PM By: Dayton Scrape Entered By: Dayton Scrape on 08/29/2023 06:48:45 -------------------------------------------------------------------------------- Encounter Discharge Information Details Patient Name: Date of Service: Keith School T. 08/29/2023 9:30 A M Medical Record Number: [id number] Patient Account Number: [id number] Date of Birth/Sex: Treating RN: [birth year] (72 y.o. Cline Cools Primary Care Elliott Lasecki: [place], Betty Other Clinician: Referring Shyniece Scripter: Treating Cheyene Hamric/Extender: Cannon, Jennifer [place], Betty Weeks in Treatment: 5 Encounter Discharge Information Items Post  Procedure Vitals Discharge Condition: Stable Temperature (F): 98.3 Ambulatory Status: Wheelchair Pulse (bpm): 102 Discharge Destination: Home Respiratory Rate (breaths/min): 18 Transportation: Private Auto Blood Pressure (mmHg): 102/57 Accompanied By: wife Schedule Follow-up Appointment: Yes Clinical Summary of Care: Patient Declined Electronic Signature(s) Signed: 08/29/2023 4:04:30 PM By: Redmond Pulling RN, BSN Entered By: Redmond Pulling on 08/29/2023 07:18:47 Keith [name] ([id number]) [id number] Page 2 of 7 -------------------------------------------------------------------------------- Lower Extremity Assessment Details Patient Name: Date of Service: Keith [name] 08/29/2023 9:30 A M Medical Record Number: [id number] Patient Account Number: [id number] Date of Birth/Sex: Treating RN: 1951-05-24 (72 y.o. Cline Cools Primary Care Xayla Puzio: [place], Betty Other Clinician: Referring Aryannah Mohon: Treating Keari Miu/Extender: Cannon, Jennifer [place], Betty Weeks in Treatment: 5 Electronic Signature(s) Signed: 08/29/2023 4:04:30 PM By: Redmond Pulling RN, BSN Entered By: Redmond Pulling on 08/29/2023 06:56:35 -------------------------------------------------------------------------------- Multi Wound Chart Details Patient Name: Date of Service: Keith School T. 08/29/2023 9:30 A M Medical Record Number: [id number] Patient Account Number: [id number] Date of Birth/Sex: Treating RN: 11-01-1950 (72 y.o. M) Primary Care Gregrey Bloyd: [place], Betty Other Clinician: Referring Dylan Monforte: Treating Albina Gosney/Extender: Cannon, Jennifer [place], Betty Weeks in Treatment: 5 Vital Signs Height(in): 72 Pulse(bpm): 102 Weight(lbs): 242 Blood Pressure(mmHg): 102/57 Body Mass Index(BMI): 32.8 Temperature(F): 98.3 Respiratory Rate(breaths/min): 18 [2:Photos:] [N/A:N/A] Sacrum Left, Medial Gluteus N/A Wound Location: Pressure Injury Bump N/A Wounding  Event: Pressure Ulcer Abscess N/A Primary Etiology: Type II Diabetes, Osteoarthritis, Type II Diabetes, Osteoarthritis, N/A Comorbid History: Neuropathy Neuropathy 06/24/2023 07/30/2023 N/A Date Acquired: 5 4 N/A Weeks of Treatment: Healed - Epithelialized Open N/A Wound Status: No No N/A Wound Recurrence: 0x0x0 0.3x0.3x0.1 N/A Measurements L x W x D (cm) 0 0.071 N/A A (cm) : rea 0 0.007 N/A Volume (cm) : 100.00% 85.90% N/A % Reduction in Area: 100.00% 86.00% N/A % Reduction in Volume: Category/Stage III Full Thickness Without Exposed N/A Classification: Support Structures None Present None Present N/A Exudate Amount: N/A Indistinct, nonvisible N/A Wound Margin: None Present (0%)  Large (67-100%) N/A Granulation Amount: N/A Red N/A Granulation Quality: None Present (0%) Small (1-33%) N/A Necrotic Amount: Fascia: No Fat Layer (Subcutaneous Tissue): Yes N/A Exposed Structures: Keith [name] ([id number]) [id number] Page 3 of 7 Fat Layer (Subcutaneous Tissue): No Fascia: No Tendon: No Tendon: No Muscle: No Muscle: No Joint: No Joint: No Bone: No Bone: No Large (67-100%) Large (67-100%) N/A Epithelialization: N/A Debridement - Excisional N/A Debridement: Pre-procedure Verification/Time Out N/A 10:03 N/A Taken: N/A Lidocaine 4% Topical Solution N/A Pain Control: N/A Subcutaneous, Slough N/A Tissue Debrided: N/A Skin/Subcutaneous Tissue N/A Level: N/A 0.07 N/A Debridement A (sq cm): rea N/A Curette N/A Instrument: N/A Minimum N/A Bleeding: N/A Pressure N/A Hemostasis A chieved: N/A Procedure was tolerated well N/A Debridement Treatment Response: N/A 0.3x0.3x0.1 N/A Post Debridement Measurements L x W x D (cm) N/A 0.007 N/A Post Debridement Volume: (cm) No Abnormalities Noted No Abnormalities Noted N/A Periwound Skin Texture: No Abnormalities Noted No Abnormalities Noted N/A Periwound Skin Moisture: No  Abnormalities Noted No Abnormalities Noted N/A Periwound Skin Color: No Abnormality No Abnormality N/A Temperature: N/A Yes N/A Tenderness on Palpation: N/A Debridement N/A Procedures Performed: Treatment Notes Electronic Signature(s) Signed: 08/29/2023 10:07:56 AM By: Duanne Guess MD FACS Entered By: Duanne Guess on 08/29/2023 07:07:56 -------------------------------------------------------------------------------- Multi-Disciplinary Care Plan Details Patient Name: Date of Service: Keith School T. 08/29/2023 9:30 A M Medical Record Number: [id number] Patient Account Number: [id number] Date of Birth/Sex: Treating RN: 09-05-1951 (72 y.o. Cline Cools Primary Care Leonor Darnell: [place], Betty Other Clinician: Referring Trameka Dorough: Treating Bernadette Gores/Extender: Cannon, Jennifer [place], Betty Weeks in Treatment: 5 Multidisciplinary Care Plan reviewed with physician Active Inactive Wound/Skin Impairment Nursing Diagnoses: Impaired tissue integrity Goals: Patient/caregiver will verbalize understanding of skin care regimen Date Initiated: 07/22/2023 Target Resolution Date: 08/21/2023 Goal Status: Active Ulcer/skin breakdown will have a volume reduction of 30% by week 4 Date Initiated: 08/14/2023 Target Resolution Date: 08/21/2023 Goal Status: Active Interventions: Assess ulceration(s) every visit Treatment Activities: Skin care regimen initiated : 07/22/2023 Notes: Electronic Signature(s) Keith [name] ([id number]) [id number].pdf Page 4 of 7 Signed: 08/29/2023 4:04:30 PM By: Redmond Pulling RN, BSN Entered By: Redmond Pulling on 08/29/2023 07:06:45 -------------------------------------------------------------------------------- Pain Assessment Details Patient Name: Date of Service: Keith School T. 08/29/2023 9:30 A M Medical Record Number: [id number] Patient Account Number: [id number] Date of Birth/Sex: Treating RN: 11/10/1950 (72  y.o. M) Primary Care Culver Feighner: [place], Betty Other Clinician: Referring Janete Quilling: Treating Jaequan Propes/Extender: Cannon, Jennifer [place], Betty Weeks in Treatment: 5 Active Problems Location of Pain Severity and Description of Pain Patient Has Paino Yes Site Locations Rate the pain. Current Pain Level: 6 Worst Pain Level: 10 Least Pain Level: 0 Tolerable Pain Level: 4 Pain Management and Medication Current Pain Management: Electronic Signature(s) Signed: 08/29/2023 1:49:04 PM By: Dayton Scrape Entered By: Dayton Scrape on 08/29/2023 06:49:26 -------------------------------------------------------------------------------- Patient/Caregiver Education Details Patient Name: Date of Service: Keith [name] 11/21/2024andnbsp9:30 A M Medical Record Number: [id number] Patient Account Number: [id number] Date of Birth/Gender: Treating RN: Sep 14, 1951 (72 y.o. Cline Cools Primary Care Physician: [place], Betty Other Clinician: Referring Physician: Treating Physician/Extender: Cannon, Jennifer [place], Betty Weeks in Treatment: 5 Education Assessment Education Provided To: Patient MURPHY, MARG ([id number]) [id number] Page 5 of 7 Education Topics Provided Wound/Skin Impairment: Methods: Explain/Verbal Responses: State content correctly Electronic Signature(s) Signed: 08/29/2023 4:04:30 PM By: Redmond Pulling RN, BSN Entered By: Redmond Pulling on 08/29/2023 06:58:46 -------------------------------------------------------------------------------- Wound Assessment Details Patient Name: Date of Service: Keith [name], A DRIA N T.  08/29/2023 9:30 A M Medical Record Number: [id number] Patient Account Number: [id number] Date of Birth/Sex: Treating RN: 1951-04-24 (72 y.o. M) Primary Care Aritzel Krusemark: [place], Betty Other Clinician: Referring Zeric Baranowski: Treating Della Scrivener/Extender: Cannon, Jennifer [place], Betty Weeks in Treatment: 5 Wound Status Wound Number:  2 Primary Etiology: Pressure Ulcer Wound Location: Sacrum Wound Status: Healed - Epithelialized Wounding Event: Pressure Injury Comorbid History: Type II Diabetes, Osteoarthritis, Neuropathy Date Acquired: 06/24/2023 Weeks Of Treatment: 5 Clustered Wound: No Photos Wound Measurements Length: (cm) Width: (cm) Depth: (cm) Area: (cm) Volume: (cm) 0 % Reduction in Area: 100% 0 % Reduction in Volume: 100% 0 Epithelialization: Large (67-100%) 0 Tunneling: No 0 Undermining: No Wound Description Classification: Category/Stage III Exudate Amount: None Present Foul Odor After Cleansing: No Slough/Fibrino No Wound Bed Granulation Amount: None Present (0%) Exposed Structure Necrotic Amount: None Present (0%) Fascia Exposed: No Fat Layer (Subcutaneous Tissue) Exposed: No Tendon Exposed: No Muscle Exposed: No Joint Exposed: No Bone Exposed: No [address] [name] ([id number]) [id number] Page 6 of 7 No Abnormalities Noted: Yes No Abnormalities Noted: Yes Moisture Temperature / Pain No Abnormalities Noted: Yes Temperature: No Abnormality Electronic Signature(s) Signed: 08/29/2023 4:04:30 PM By: Redmond Pulling RN, BSN Entered By: Redmond Pulling on 08/29/2023 07:04:29 -------------------------------------------------------------------------------- Wound Assessment Details Patient Name: Date of Service: Keith School T. 08/29/2023 9:30 A M Medical Record Number: [id number] Patient Account Number: [id number] Date of Birth/Sex: Treating RN: 1951/06/19 (72 y.o. M) Primary Care Mariacristina Aday: [place], Betty Other Clinician: Referring Loui Massenburg: Treating Jaziel Bennett/Extender: Cannon, Jennifer [place], Betty Weeks in Treatment: 5 Wound Status Wound Number: 3 Primary Etiology: Abscess Wound Location: Left, Medial Gluteus Wound Status: Open Wounding Event: Bump Comorbid History: Type II Diabetes, Osteoarthritis,  Neuropathy Date Acquired: 07/30/2023 Weeks Of Treatment: 4 Clustered Wound: No Photos Wound Measurements Length: (cm) 0.3 Width: (cm) 0.3 Depth: (cm) 0.1 Area: (cm) 0.071 Volume: (cm) 0.007 % Reduction in Area: 85.9% % Reduction in Volume: 86% Epithelialization: Large (67-100%) Tunneling: No Undermining: No Wound Description Classification: Full Thickness Without Exposed Support Structures Wound Margin: Indistinct, nonvisible Exudate Amount: None Present Foul Odor After Cleansing: No Slough/Fibrino No Wound Bed Granulation Amount: Large (67-100%) Exposed Structure Granulation Quality: Red Fascia Exposed: No Necrotic Amount: Small (1-33%) Fat Layer (Subcutaneous Tissue) Exposed: Yes Necrotic Quality: Adherent Slough Tendon Exposed: No Muscle Exposed: No Joint Exposed: No Bone Exposed: No Periwound Skin Texture Texture Color No Abnormalities Noted: Yes No Abnormalities Noted: Yes Moisture Temperature / Pain PINCKNEY, FERLITA T ([id number]) [id number] Page 7 of 7 No Abnormalities Noted: Yes Temperature: No Abnormality Tenderness on Palpation: Yes Electronic Signature(s) Signed: 08/29/2023 4:04:30 PM By: Redmond Pulling RN, BSN Entered By: Redmond Pulling on 08/29/2023 07:05:09 -------------------------------------------------------------------------------- Vitals Details Patient Name: Date of Service: Keith [name], Keith Oppenheim T. 08/29/2023 9:30 A M Medical Record Number: [id number] Patient Account Number: [id number] Date of Birth/Sex: Treating RN: Jan 26, 1951 (72 y.o. M) Primary Care Bryler Dibble: [place], Betty Other Clinician: Referring Rembert Browe: Treating Laith Antonelli/Extender: Cannon, Jennifer [place], Betty Weeks in Treatment: 5 Vital Signs Time Taken: 09:48 Temperature (F): 98.3 Height (in): 72 Pulse (bpm): 102 Weight (lbs): 242 Respiratory Rate (breaths/min): 18 Body Mass Index (BMI): 32.8 Blood Pressure (mmHg): 102/57 Reference Range: 80 - 120  mg / dl Electronic Signature(s) Signed: 08/29/2023 1:49:04 PM By: Dayton Scrape Entered By: Dayton Scrape on 08/29/2023 06:49:09

## 2023-08-29 NOTE — Progress Notes (Signed)
Keith [name] ([id number]) [id number] Page 1 of 8 Visit Report for 08/29/2023 Chief Complaint Document Details Patient Name: Date of Service: Keith [name] 08/29/2023 9:30 A M Medical Record Number: [id number] Patient Account Number: [id number] Date of Birth/Sex: Treating RN: 04/13/1951 (72 y.o. M) Primary Care Provider: [place], Betty Other Clinician: Referring Provider: Treating Provider/Extender: Ordean Fouts [place], Betty Weeks in Treatment: 5 Information Obtained from: Patient Chief Complaint Patient is at the clinic for treatment of open pressure ulcers Electronic Signature(s) Signed: 08/29/2023 10:09:30 AM By: Duanne Guess MD FACS Entered By: Duanne Guess on 08/29/2023 07:09:30 -------------------------------------------------------------------------------- Debridement Details Patient Name: Date of Service: Keith School [name]. 08/29/2023 9:30 A M Medical Record Number: [id number] Patient Account Number: [id number] Date of Birth/Sex: Treating RN: 1951-08-20 (72 y.o. M) Primary Care Provider: [place], Betty Other Clinician: Referring Provider: Treating Provider/Extender: Kimala Horne [place], Betty Weeks in Treatment: 5 Debridement Performed for Assessment: Wound #3 Right,Medial Gluteus Performed By: Physician Duanne Guess, MD The following information was scribed by: Redmond Pulling The information was scribed for: Duanne Guess Debridement Type: Debridement Level of Consciousness (Pre-procedure): Awake and Alert Pre-procedure Verification/Time Out Yes - 10:03 Taken: Start Time: 10:03 Pain Control: Lidocaine 4% [name] opical Solution Percent of Wound Bed Debrided: 100% [name] Area Debrided (cm): otal 0.07 Tissue and other material debrided: Non-Viable, Slough, Subcutaneous, Slough Level: Skin/Subcutaneous Tissue Debridement Description: Excisional Instrument: Curette Bleeding: Minimum Hemostasis Achieved:  Pressure Response to Treatment: Procedure was tolerated well Level of Consciousness (Post- Awake and Alert procedure): Post Debridement Measurements of Total Wound Length: (cm) 0.3 Width: (cm) 0.3 Depth: (cm) 0.1 Volume: (cm) 0.007 Character of Wound/Ulcer Post Debridement: Improved Post Procedure Diagnosis [name] ([id number]) [id number] Page 2 of 8 Same as Pre-procedure Electronic Signature(s) Signed: 08/29/2023 10:08:11 AM By: Duanne Guess MD FACS Entered By: Duanne Guess on 08/29/2023 07:08:11 -------------------------------------------------------------------------------- HPI Details Patient Name: Date of Service: Keith Overall, [name]. 08/29/2023 9:30 A M Medical Record Number: [id number] Patient Account Number: [id number] Date of Birth/Sex: Treating RN: [birth year] (72 y.o. M) Primary Care Provider: [place], Betty Other Clinician: Referring Provider: Treating Provider/Extender: Taresa Montville [place], Betty Weeks in Treatment: 5 History of Present Illness HPI Description: ADMISSION 07/22/2023 This is a 72 year old type II diabetic (last hemoglobin A1c 5.5% in March 2024). He has spinal stenosis with cord compression and thoracic myelopathy. He has CKD stage III. He is nonambulatory and spends most of his time in either wheelchair or in bed. He recently saw his primary care provider at the end of September and was noted to have a pressure injury of his left buttock. The provider was unable to directly evaluated because he cannot stand long enough for an examination but his wife provided a picture and based on that photograph, it was assessed as a stage II measuring 1 to 1.5 inches. He was advised to increase his protein intake, avoid pressure on the affected area, apply Desitin, and was referred to the wound care center for further evaluation and management. 07/30/2023: Both of the existing pressure ulcers are smaller today. They  are quite clean without any slough or eschar accumulation. He has a small abscess on his right medial buttock. He says that he periodically gets boils in this area. 08/14/2023: The abscess on his right medial buttock is healed. The sacral ulcer is almost completely closed and is quite superficial. The left ischial ulcer is smaller with just a little bit of rubbery slough on the surface. 08/29/2023: The sacral  ulcer is healed. Somewhere in our documentation, left and right has gotten mixed up. He currently does not have an ulcer on his left ischium but has a small ulcer on his right gluteus. This is nearly closed. Electronic Signature(s) Signed: 08/29/2023 10:11:15 AM By: Duanne Guess MD FACS Entered By: Duanne Guess on 08/29/2023 07:11:15 -------------------------------------------------------------------------------- Physical Exam Details Patient Name: Date of Service: Keith School [name]. 08/29/2023 9:30 A M Medical Record Number: [id number] Patient Account Number: [id number] Date of Birth/Sex: Treating RN: [birth year] (72 y.o. M) Primary Care Provider: [place], Betty Other Clinician: Referring Provider: Treating Provider/Extender: Chaze Hruska [place], Betty Weeks in Treatment: 5 Constitutional . Slightly tachycardic. . . no acute distress. Respiratory Normal work of breathing on room air.. Notes 08/29/2023: The sacral ulcer is healed. Somewhere in our documentation, left and right has gotten mixed up. He currently does not have an ulcer on his left ischium but has a small ulcer on his right gluteus. This is nearly closed. Keith [name] ([id number]) [id number] Page 3 of 8 Electronic Signature(s) Signed: 08/29/2023 10:11:44 AM By: Duanne Guess MD FACS Entered By: Duanne Guess on 08/29/2023 07:11:44 -------------------------------------------------------------------------------- Physician Orders Details Patient Name: Date of  Service: Keith School [name]. 08/29/2023 9:30 A M Medical Record Number: [id number] Patient Account Number: [id number] Date of Birth/Sex: Treating RN: May 22, 1951 (72 y.o. Cline Cools Primary Care Provider: [place], Betty Other Clinician: Referring Provider: Treating Provider/Extender: Melissaann Dizdarevic [place], Betty Weeks in Treatment: 5 Verbal / Phone Orders: No Diagnosis Coding ICD-10 Coding Code Description L89.313 Pressure ulcer of right buttock, stage 3 E11.622 Type 2 diabetes mellitus with other skin ulcer G95.20 Unspecified cord compression N18.30 Chronic kidney disease, stage 3 unspecified Follow-up Appointments Return appointment in 3 weeks. - Dr. Lady Gary RM 1 or 3 ******HOYER*** Thursday 11/21 @ 09:30 am Anesthetic (In clinic) Topical Lidocaine 4% applied to wound bed Bathing/ Shower/ Hygiene May shower and wash wound with soap and water. Off-Loading Other: - Will order Roho Cushion. While in wheelchair, keep shifting weight . Turn in bed at least every 2 hours Additional Orders / Instructions Follow Nutritious Diet Other: - Goal is 70-100g protein per day Wound Treatment Wound #3 - Gluteus Wound Laterality: Right, Medial Cleanser: Soap and Water 1 x Per Day/30 Days Discharge Instructions: May shower and wash wound with dial antibacterial soap and water prior to dressing change. Prim Dressing: Maxorb Extra Ag+ Alginate Dressing, 2x2 (in/in) (Generic) 1 x Per Day/30 Days ary Discharge Instructions: Apply to wound bed as instructed Secondary Dressing: ALLEVYN Gentle Border, 3x3 (in/in) (Generic) 1 x Per Day/30 Days Discharge Instructions: Apply over primary dressing as directed. Patient Medications llergies: lisinopril, aspirin A Notifications Medication Indication Start End 08/29/2023 lidocaine DOSE topical 4 % cream - cream topical once daily Electronic Signature(s) Signed: 08/29/2023 11:02:44 AM By: Duanne Guess MD FACS Entered By: Duanne Guess  on 08/29/2023 07:13:03 [name] ([id number]) [id number] Page 4 of 8 -------------------------------------------------------------------------------- Problem List Details Patient Name: Date of Service: Keith [name] 08/29/2023 9:30 A M Medical Record Number: [id number] Patient Account Number: [id number] Date of Birth/Sex: Treating RN: 1951-08-09 (72 y.o. M) Primary Care Provider: [place], Betty Other Clinician: Referring Provider: Treating Provider/Extender: Bazil Dhanani [place], Betty Weeks in Treatment: 5 Active Problems ICD-10 Encounter Code Description Active Date MDM Diagnosis L89.313 Pressure ulcer of right buttock, stage 3 08/29/2023 No Yes E11.622 Type 2 diabetes mellitus with other skin ulcer 07/22/2023 No Yes G95.20 Unspecified cord compression 07/22/2023 No Yes  N18.30 Chronic kidney disease, stage 3 unspecified 07/22/2023 No Yes Inactive Problems Resolved Problems ICD-10 Code Description Active Date Resolved Date L89.153 Pressure ulcer of sacral region, stage 3 07/22/2023 07/22/2023 L02.31 Cutaneous abscess of buttock 07/30/2023 07/30/2023 Electronic Signature(s) Signed: 08/29/2023 10:12:44 AM By: Duanne Guess MD FACS Previous Signature: 08/29/2023 10:04:27 AM Version By: Duanne Guess MD FACS Entered By: Duanne Guess on 08/29/2023 07:12:44 -------------------------------------------------------------------------------- Progress Note Details Patient Name: Date of Service: Keith School [name]. 08/29/2023 9:30 A M Medical Record Number: [id number] Patient Account Number: [id number] Date of Birth/Sex: Treating RN: Feb 12, 1951 (72 y.o. M) Primary Care Provider: [place], Betty Other Clinician: Referring Provider: Treating Provider/Extender: Cali Cuartas [place], Betty Weeks in Treatment: 5 [place], [name] ([id number]) [id number] Page 5 of 8 Subjective Chief  Complaint Information obtained from Patient Patient is at the clinic for treatment of open pressure ulcers History of Present Illness (HPI) ADMISSION 07/22/2023 This is a 72 year old type II diabetic (last hemoglobin A1c 5.5% in March 2024). He has spinal stenosis with cord compression and thoracic myelopathy. He has CKD stage III. He is nonambulatory and spends most of his time in either wheelchair or in bed. He recently saw his primary care provider at the end of September and was noted to have a pressure injury of his left buttock. The provider was unable to directly evaluated because he cannot stand long enough for an examination but his wife provided a picture and based on that photograph, it was assessed as a stage II measuring 1 to 1.5 inches. He was advised to increase his protein intake, avoid pressure on the affected area, apply Desitin, and was referred to the wound care center for further evaluation and management. 07/30/2023: Both of the existing pressure ulcers are smaller today. They are quite clean without any slough or eschar accumulation. He has a small abscess on his right medial buttock. He says that he periodically gets boils in this area. 08/14/2023: The abscess on his right medial buttock is healed. The sacral ulcer is almost completely closed and is quite superficial. The left ischial ulcer is smaller with just a little bit of rubbery slough on the surface. 08/29/2023: The sacral ulcer is healed. Somewhere in our documentation, left and right has gotten mixed up. He currently does not have an ulcer on his left ischium but has a small ulcer on his right gluteus. This is nearly closed. Patient History Information obtained from Patient, Chart. Family History Unknown History. Social History Former smoker - ended on 10/08/1993, Marital Status - Married, Alcohol Use - Rarely, Drug Use - No History, Caffeine Use - Moderate. Medical History Endocrine Patient has history of Type  II Diabetes - pt. states he is not Diabetic Musculoskeletal Patient has history of Osteoarthritis Neurologic Patient has history of Neuropathy - Diabetic Peripheral Neuropathy Medical A Surgical History Notes nd Respiratory Hx: Community Aquired Pneumonia; Loculated Pleural Effusion; Chronic Respiratory Failure with Hypoxia Gastrointestinal Hx: Drug induced Constipation;GERD Endocrine Last Hem A1C: 5.1% (07/01/23) Genitourinary Chronic Kidney Disease Stage III Musculoskeletal Hx: Closed Right Trimalleolar Fracture;Closed Right Ankle Fracture; Bilateral Leg weakness Neurologic Hx: Spinal Cord Compression;Thoracic Myelopathy Objective Constitutional Slightly tachycardic. no acute distress. Vitals Time Taken: 9:48 AM, Height: 72 in, Weight: 242 lbs, BMI: 32.8, Temperature: 98.3 F, Pulse: 102 bpm, Respiratory Rate: 18 breaths/min, Blood Pressure: 102/57 mmHg. Respiratory Normal work of breathing on room air.. General Notes: 08/29/2023: The sacral ulcer is healed. Somewhere in our documentation, left and right has gotten mixed up. He currently does  not have an ulcer on his left ischium but has a small ulcer on his right gluteus. This is nearly closed. Integumentary (Hair, Skin) Wound #2 status is Healed - Epithelialized. Original cause of wound was Pressure Injury. The date acquired was: 06/24/2023. The wound has been in treatment 5 weeks. The wound is located on the Sacrum. The wound measures 0cm length x 0cm width x 0cm depth; 0cm^2 area and 0cm^3 volume. There is no tunneling or undermining noted. There is a none present amount of drainage noted. There is no granulation within the wound bed. There is no necrotic tissue within the wound bed. The periwound skin appearance had no abnormalities noted for texture. The periwound skin appearance had no abnormalities noted for moisture. The periwound skin appearance had no abnormalities noted for color. Periwound temperature was noted as No  Abnormality. Keith [name] ([id number]) [id number] Page 6 of 8 Wound #3 status is Open. Original cause of wound was Bump. The date acquired was: 07/30/2023. The wound has been in treatment 4 weeks. The wound is located on the Right,Medial Gluteus. The wound measures 0.3cm length x 0.3cm width x 0.1cm depth; 0.071cm^2 area and 0.007cm^3 volume. There is Fat Layer (Subcutaneous Tissue) exposed. There is no tunneling or undermining noted. There is a none present amount of drainage noted. The wound margin is indistinct and nonvisible. There is large (67-100%) red granulation within the wound bed. There is a small (1-33%) amount of necrotic tissue within the wound bed including Adherent Slough. The periwound skin appearance had no abnormalities noted for texture. The periwound skin appearance had no abnormalities noted for moisture. The periwound skin appearance had no abnormalities noted for color. Periwound temperature was noted as No Abnormality. The periwound has tenderness on palpation. Assessment Active Problems ICD-10 Pressure ulcer of right buttock, stage 3 Type 2 diabetes mellitus with other skin ulcer Unspecified cord compression Chronic kidney disease, stage 3 unspecified Procedures Wound #3 Pre-procedure diagnosis of Wound #3 is an Abscess located on the Right,Medial Gluteus . There was a Excisional Skin/Subcutaneous Tissue Debridement with a total area of 0.07 sq cm performed by Duanne Guess, MD. With the following instrument(s): Curette to remove Non-Viable tissue/material. Material removed includes Subcutaneous Tissue and Slough and after achieving pain control using Lidocaine 4% [name] opical Solution. No specimens were taken. A time out was conducted at 10:03, prior to the start of the procedure. A Minimum amount of bleeding was controlled with Pressure. The procedure was tolerated well. Post Debridement Measurements: 0.3cm length x 0.3cm width x 0.1cm  depth; 0.007cm^3 volume. Character of Wound/Ulcer Post Debridement is improved. Post procedure Diagnosis Wound #3: Same as Pre-Procedure Plan Follow-up Appointments: Return appointment in 3 weeks. - Dr. Lady Gary RM 1 or 3 ******HOYER*** Thursday 11/21 @ 09:30 am Anesthetic: (In clinic) Topical Lidocaine 4% applied to wound bed Bathing/ Shower/ Hygiene: May shower and wash wound with soap and water. Off-Loading: Other: - Will order Roho Cushion. While in wheelchair, keep shifting weight . Turn in bed at least every 2 hours Additional Orders / Instructions: Follow Nutritious Diet Other: - Goal is 70-100g protein per day The following medication(s) was prescribed: lidocaine topical 4 % cream cream topical once daily was prescribed at facility WOUND #3: - Gluteus Wound Laterality: Right, Medial Cleanser: Soap and Water 1 x Per Day/30 Days Discharge Instructions: May shower and wash wound with dial antibacterial soap and water prior to dressing change. Prim Dressing: Maxorb Extra Ag+ Alginate Dressing, 2x2 (in/in) (Generic) 1 x Per Day/30  Days ary Discharge Instructions: Apply to wound bed as instructed Secondary Dressing: ALLEVYN Gentle Border, 3x3 (in/in) (Generic) 1 x Per Day/30 Days Discharge Instructions: Apply over primary dressing as directed. 08/29/2023: The sacral ulcer is healed. Somewhere in our documentation, left and right has gotten mixed up. He currently does not have an ulcer on his left ischium but has a small ulcer on his right gluteus. This is nearly closed. I used a curette to debride slough and subcutaneous tissue from the ulcer on his right buttock. We will use silver alginate and an Allevyn border dressing. Continue offloading and maintain adequate protein intake. Due to clinic scheduling and provider availability, he will follow-up in 3 weeks. Electronic Signature(s) Signed: 08/29/2023 10:13:56 AM By: Duanne Guess MD FACS Entered By: Duanne Guess on 08/29/2023  07:13:56 [name] ([id number]) [id number] Page 7 of 8 -------------------------------------------------------------------------------- HxROS Details Patient Name: Date of Service: Keith [name] 08/29/2023 9:30 A M Medical Record Number: [id number] Patient Account Number: [id number] Date of Birth/Sex: Treating RN: 1951/09/25 (72 y.o. M) Primary Care Provider: [place], Betty Other Clinician: Referring Provider: Treating Provider/Extender: Kasee Hantz [place], Betty Weeks in Treatment: 5 Information Obtained From Patient Chart Respiratory Medical History: Past Medical History Notes: Hx: Community Aquired Pneumonia; Loculated Pleural Effusion; Chronic Respiratory Failure with Hypoxia Gastrointestinal Medical History: Past Medical History Notes: Hx: Drug induced Constipation;GERD Endocrine Medical History: Positive for: Type II Diabetes - pt. states he is not Diabetic Past Medical History Notes: Last Hem A1C: 5.1% (07/01/23) Genitourinary Medical History: Past Medical History Notes: Chronic Kidney Disease Stage III Musculoskeletal Medical History: Positive for: Osteoarthritis Past Medical History Notes: Hx: Closed Right Trimalleolar Fracture;Closed Right Ankle Fracture; Bilateral Leg weakness Neurologic Medical History: Positive for: Neuropathy - Diabetic Peripheral Neuropathy Past Medical History Notes: Hx: Spinal Cord Compression;Thoracic Myelopathy Immunizations Pneumococcal Vaccine: Received Pneumococcal Vaccination: Yes Received Pneumococcal Vaccination On or After 60th Birthday: Yes Implantable Devices No devices added Family and Social History Unknown History: Yes; Former smoker - ended on 10/08/1993; Marital Status - Married; Alcohol Use: Rarely; Drug Use: No History; Caffeine Use: Moderate; Financial Concerns: No; Food, Clothing or Shelter Needs: No; Support System Lacking: No; Transportation Concerns: No Electronic  Signature(s) Signed: 08/29/2023 11:02:44 AM By: Duanne Guess MD FACS Entered By: Duanne Guess on 08/29/2023 78:46:96 [name] ([id number]) [id number] Page 8 of 8 -------------------------------------------------------------------------------- SuperBill Details Patient Name: Date of Service: Keith [name] 08/29/2023 Medical Record Number: [id number] Patient Account Number: [id number] Date of Birth/Sex: Treating RN: 12/15/1950 (72 y.o. M) Primary Care Provider: [place], Betty Other Clinician: Referring Provider: Treating Provider/Extender: [place] Duquette [place], Betty Weeks in Treatment: 5 Diagnosis Coding ICD-10 Codes Code Description L89.313 Pressure ulcer of right buttock, stage 3 E11.622 Type 2 diabetes mellitus with other skin ulcer G95.20 Unspecified cord compression N18.30 Chronic kidney disease, stage 3 unspecified Facility Procedures : CPT4 Code: [id number] Description: 11042 - DEB SUBQ TISSUE 20 SQ CM/< ICD-10 Diagnosis Description L89.313 Pressure ulcer of right buttock, stage 3 Modifier: Quantity: 1 Physician Procedures : CPT4 Code Description Modifier [id number] - WC PHYS LEVEL 3 - EST PT ICD-10 Diagnosis Description L89.313 Pressure ulcer of right buttock, stage 3 E11.622 Type 2 diabetes mellitus with other skin ulcer G95.20 Unspecified cord compression N18.30  Chronic kidney disease, stage 3 unspecified Quantity: 1 : [id number] 11042 - WC PHYS SUBQ TISS 20 SQ CM ICD-10 Diagnosis Description L89.313 Pressure ulcer of right buttock, stage 3 Quantity: 1 Electronic Signature(s) Signed: 08/29/2023 10:14:14 AM By:  Duanne Guess MD FACS Entered By: Duanne Guess on 08/29/2023 07:14:14

## 2023-09-02 ENCOUNTER — Other Ambulatory Visit (HOSPITAL_COMMUNITY): Payer: Self-pay | Admitting: Cardiology

## 2023-09-03 ENCOUNTER — Other Ambulatory Visit: Payer: Self-pay

## 2023-09-03 ENCOUNTER — Other Ambulatory Visit (HOSPITAL_COMMUNITY): Payer: Self-pay

## 2023-09-03 MED ORDER — GABAPENTIN 800 MG PO TABS
800.0000 mg | ORAL_TABLET | Freq: Three times a day (TID) | ORAL | 0 refills | Status: DC
  Filled 2023-09-03: qty 90, 30d supply, fill #0

## 2023-09-03 MED ORDER — HYDROCODONE-ACETAMINOPHEN 7.5-325 MG PO TABS
1.0000 | ORAL_TABLET | Freq: Four times a day (QID) | ORAL | 0 refills | Status: DC | PRN
Start: 2023-09-03 — End: 2023-09-18
  Filled 2023-09-04: qty 120, 30d supply, fill #0

## 2023-09-04 ENCOUNTER — Other Ambulatory Visit (HOSPITAL_COMMUNITY): Payer: Self-pay

## 2023-09-06 ENCOUNTER — Other Ambulatory Visit: Payer: Self-pay

## 2023-09-10 ENCOUNTER — Other Ambulatory Visit (HOSPITAL_COMMUNITY): Payer: Self-pay

## 2023-09-10 ENCOUNTER — Other Ambulatory Visit: Payer: Self-pay | Admitting: Cardiology

## 2023-09-10 ENCOUNTER — Other Ambulatory Visit: Payer: Self-pay | Admitting: Family Medicine

## 2023-09-12 ENCOUNTER — Other Ambulatory Visit (HOSPITAL_COMMUNITY): Payer: Self-pay

## 2023-09-12 ENCOUNTER — Telehealth (HOSPITAL_COMMUNITY): Payer: Self-pay | Admitting: Pharmacist

## 2023-09-12 ENCOUNTER — Telehealth (HOSPITAL_COMMUNITY): Payer: Self-pay | Admitting: Pharmacy Technician

## 2023-09-12 NOTE — Telephone Encounter (Signed)
Advanced Heart Failure Patient Advocate Encounter  Prior Authorization for Opsum[employer] and Ademp[employer] have been approved.    Karle Plumber, PharmD, BCPS, BCCP, CPP Heart Failure Clinic Pharmacist [phone]

## 2023-09-12 NOTE — Telephone Encounter (Signed)
Advanced Heart Failure Patient Advocate Encounter  Spoke with patient regarding TAF renewal for 2025. Reapplied for coverage for Opsumit and Adempas. Anticipated outcome will be sometime in January.   Archer Asa, CPhT

## 2023-09-12 NOTE — Telephone Encounter (Signed)
Patient Advocate Encounter   Received notification from [employer] that prior authorization for Opsumit and Adempas are required.   PA submitted on CoverMyMeds Key [id number], [id number]  Status is pending   Will continue to follow.   Karle Plumber, PharmD, BCPS, BCCP, CPP Heart Failure Clinic Pharmacist [phone]

## 2023-09-16 ENCOUNTER — Telehealth: Payer: Self-pay | Admitting: Family Medicine

## 2023-09-16 MED ORDER — DULOXETINE HCL 60 MG PO CPEP: ORAL_CAPSULE | ORAL | 1 refills | Status: DC

## 2023-09-16 NOTE — Telephone Encounter (Signed)
I called and spoke with patient. Dr. Berline Chough was prescribing this for him, but she only did it for while he was in the hospital. Rx was previously prescribed by PCP. Rx sent in.

## 2023-09-16 NOTE — Telephone Encounter (Signed)
Pt called, very upset and aggressive.  Stated he has been waiting 3 weeks for his refills.  Pt is asking if MD discontinued Rx, changed it, or what is going on? Pt is demanding a call back today!!

## 2023-09-16 NOTE — Progress Notes (Signed)
ADVANCED HF CLINIC NOTE  Primary Care: [place], [name], MD HF Cardiologist: Dr. [name]   HPI: Keith [name] is a 72 y.o.male with history saddle PE/LLE DVT 09/21 s/p TPA complicated by hypotension requiring Levophed, multiple prior back surgeries, left adrenal adenoma, DM II, obesity, HTN.   Admitted 03/01/22 with CP. He was hypoxic requiring supplemental oxygen. V/Q scan with moderate to high probability of PE.  Lifelong DOAC recommended, started on Eliquis 5 BID (had been off eliquis since 02/22).   Echo in 5/23 showed EF 55-60%, mild LVH, interventricular septum flattened in systole and diastole consistent with RV pressure and volume overload, RV severely enlarged with severely reduced function, RVSP 75 mmH, moderate to severe TR.   RHC/LHC in 5/23 showed no CAD, elevated right and left heart filling pressures, and pulmonary venous hypertension.  He was seen by CT surgery. Felt to be high risk for pulmonary embolectomy. Course complicated by AKI, Scr peaked at 3.34 and improved to 1.60 (baseline). Started on torsemide 20 mg M, W, F at discharge. Atenolol/HTCZ stopped.   Echo in 9/23 showed EF 55-60%, mildly decreased RV function with mild-moderate RV enlargement, unable to estimate PA systolic pressure, IVC normal. RHC was done in 10/23, showing moderate pulmonary arterial hypertension with PVR 5 WU.    V/Q scan in 7/24 showed right lung findings suggestive of chronic PE.   Follow up 8/24, volume stable. Opsumit 10 mg daily added, referred to [facility] for consideration of balloon pulmonary angioplasty (would not be good candidate for pulmonary thromboendarterectomy).   Echo 8/24 showed hyperdynamic LV with EF 70-75%, RV read as normal.   Today he returns for RV failure follow up with his wife. Overall feeling fair. More SOB over last 2 weeks. Swelling about the same, comes and goes. Urinating a lot. Mostly WC bound due to severe spinal stenosis, unable to stand for weight today. Denies  palpitations, abnormal bleeding, CP, dizziness, or PND/Orthopnea. Appetite ok. No fever or chills. Taking all medications, stopped Mounjaro due to nausea and muscle wasting. Had recent negative COVID test.  ReDs: 45% (we used his last known weight for reading, which was 241 lbs)  Labs (6/23): K 4.0, creatinine 1.28 Labs (7/23): K 4.1, creatinine 1.69, BNP 20 Labs (9/23): K 4.2, creatinine 1.42, BNP 33 Labs (10/23): RF elevated, ANA elevated, K 4.6, creatinine 1.44, BNP 16 Labs (11/23): creatinine 1.5 Labs (1/24): K 3.8, creatinine 1.03 Labs (3/24): BNP 17, K 4.1, creatinine 1.36 Labs (9/24): K 4.1, creatinine 1.31  ECG (personally reviewed): NSR 95 bpm, low voltage.  PMH: 1. Type 2 diabetes 2. Left adrenal adenoma 3. Obesity 4. HTN 5. H/o back surgery for spinal stenosis: Has had 3 surgeries, actually could not walk after the third surgery but has been doing PT.  6. Venous thromboembolism: Saddle PE in 9/21 with LLE DVT, this was treated with TPA.   - Recurrent PE in 5/23 after stopping DOAC.  - Echo (5/23): EF 55-60%, mild LVH, interventricular septum flattened in systole and diastole consistent with RV pressure and volume overload, RV severely enlarged with severely reduced function, RVSP 75 mmH, moderate to severe TR.  - R/LHC (5/23): RA 12, PA 48/34 (39), PCWP mean 40, V wave 42, Fick CO/CI 5.93/2.33, PVR ?< 1 WU.  - Echo (9/23): EF 55-60%, mildly decreased RV function with mild-moderate RV enlargement, unable to estimate PA systolic pressure, IVC normal.  - RHC (10/23): mean RA 9, PA 60/30 mean 36, mean PCWP 6, CI 2.46, PVR 5  WU - CTA chest (1/24): No PE - ANA/anti-cardiolipid/beta-2 glycoprotein/CCP negative, ESR 50, RF 42 (2/24) - V/Q scan (7/24): right lung findings suggestive of chronic PE.  - Echo (8/24): EF hyperdynamic LV with EF 70-75%, RV read as normal.  7. CKD stage 3 8. OSA: Uses CPAP.   Review of Systems: All systems reviewed and negative except as per HPI.    Current Outpatient Medications  Medication Sig Dispense Refill   acetaminophen (TYLENOL) 325 MG tablet Take 1-2 tablets (325-650 mg total) by mouth every 4 (four) hours as needed for mild pain.     amLODipine (NORVASC) 10 MG tablet TAKE 1 TABLET BY MOUTH DAILY 100 tablet 2   Blood Glucose Monitoring Suppl (ACCU-CHEK GUIDE ME) w/Device KIT USE TO TEST BLOOD SUGARS 1-2 TIMES DAILY. 1 kit 0   carvedilol (COREG) 12.5 MG tablet TAKE 1 TABLET BY MOUTH TWICE  DAILY WITH MEALS 200 tablet 2   DULoxetine (CYMBALTA) 60 MG capsule TAKE 1 CAPSULE BY MOUTH EVERY DAY 90 capsule 1   ELIQUIS 5 MG TABS tablet TAKE 1 TABLET BY MOUTH TWICE A DAY 60 tablet 11   ferrous sulfate 325 (65 FE) MG tablet TAKE 1 TABLET BY MOUTH EVERY DAY WITH BREAKFAST 90 tablet 1   gabapentin (NEURONTIN) 800 MG tablet Take 1 tablet (800 mg total) by mouth 3 (three) times daily for neuropathy. 90 tablet 0   gabapentin (NEURONTIN) 800 MG tablet Take 1 tablet (800 mg total) by mouth 3 (three) times daily for neuropathy. 90 tablet 0   HYDROcodone-acetaminophen (NORCO) 7.5-325 MG tablet Take 1 tablet by mouth in the morning, at noon, in the evening, and at bedtime. 7.5 mg/375 mg     hydrocortisone (ANUSOL-HC) 2.5 % rectal cream Place 1 application. rectally as needed for hemorrhoids or anal itching.     levETIRAcetam (KEPPRA) 250 MG tablet TAKE 1 TABLET BY MOUTH TWICE  DAILY 180 tablet 2   lidocaine (LIDODERM) 5 % Place 1 patch onto the skin daily as needed (pain). Remove & Discard patch within 12 hours or as directed by MD     macitentan (OPSUMIT) 10 MG tablet Take 10 mg by mouth daily.     naloxone (NARCAN) nasal spray 4 mg/0.1 mL Place 1 spray into the nose as needed (accidental overdose).     naloxone (NARCAN) nasal spray 4 mg/0.1 mL Place 1 spray into the nose as needed. 1 each 3   omeprazole (PRILOSEC) 40 MG capsule TAKE 1 CAPSULE BY MOUTH DAILY 100 capsule 2   polyethylene glycol (MIRALAX / GLYCOLAX) 17 g packet Take 17 g by mouth  daily as needed for mild constipation.     Riociguat (ADEMPAS) 2 MG TABS Take 2 mg by mouth in the morning, at noon, and at bedtime. 90 tablet 11   rOPINIRole (REQUIP) 1 MG tablet TAKE 1 TABLET BY MOUTH AT  BEDTIME 100 tablet 2   rosuvastatin (CRESTOR) 20 MG tablet TAKE 1 TABLET BY MOUTH DAILY 100 tablet 2   spironolactone (ALDACTONE) 25 MG tablet TAKE 1/2 TABLET BY MOUTH EVERY DAY 45 tablet 3   torsemide (DEMADEX) 20 MG tablet TAKE 1 AND 1/2 TABLETS DAILY BY MOUTH 135 tablet 2   traZODone (DESYREL) 50 MG tablet TAKE 1/2 TO 1 TABLET BY MOUTH AT BEDTIME AS NEEDED FOR SLEEP 90 tablet 1   tirzepatide (MOUNJARO) 2.5 MG/0.5ML Pen Inject 2.5 mg into the skin once a week. (Patient not taking: Reported on 09/18/2023) 2 mL 11   No current  facility-administered medications for this encounter.   Allergies  Allergen Reactions   Aspirin Other (See Comments)    Irritates the stomach and the patient developed ulcers, also   Lisinopril Other (See Comments)    Caused a body ache   Social History   Socioeconomic History   Marital status: Married    Spouse name: Wynona Canes   Number of children: 3   Years of education: 4 years college   Highest education level: Bachelor's degree (e.g., BA, AB, BS)  Occupational History   Occupation: retired  Tobacco Use   Smoking status: Former    Current packs/day: 0.00    Average packs/day: 1 pack/day for 25.0 years (25.0 ttl pk-yrs)    Types: Cigarettes    Start date: 61    Quit date: 1995    Years since quitting: 29.9    Passive exposure: Never   Smokeless tobacco: Never  Vaping Use   Vaping status: Never Used  Substance and Sexual Activity   Alcohol use: Yes    Comment: holidays and special occasions   Drug use: Not Currently   Sexual activity: Yes    Birth control/protection: None  Other Topics Concern   Not on file  Social History Narrative   Married, 5 kids, 14 grand, 2 great grand, watch TV, Armed forces operational officer, traveling   Social  Determinants of Health   Financial Resource Strain: Low Risk  (03/16/2023)   Overall Financial Resource Strain (CARDIA)    Difficulty of Paying Living Expenses: Not hard at all  Food Insecurity: No Food Insecurity (03/16/2023)   Hunger Vital Sign    Worried About Running Out of Food in the Last Year: Never true    Ran Out of Food in the Last Year: Never true  Transportation Needs: No Transportation Needs (03/16/2023)   PRAPARE - Administrator, Civil Service (Medical): No    Lack of Transportation (Non-Medical): No  Physical Activity: Insufficiently Active (03/16/2023)   Exercise Vital Sign    Days of Exercise per Week: 3 days    Minutes of Exercise per Session: 30 min  Stress: No Stress Concern Present (03/16/2023)   [employer] of Occupational Health - Occupational Stress Questionnaire    Feeling of Stress : Not at all  Social Connections: Moderately Integrated (03/16/2023)   Social Connection and Isolation Panel [NHANES]    Frequency of Communication with Friends and Family: More than three times a week    Frequency of Social Gatherings with Friends and Family: Twice a week    Attends Religious Services: More than 4 times per year    Active Member of [employer] or Organizations: No    Attends Engineer, structural: Not on file    Marital Status: Married  Catering manager Violence: Not At Risk (10/23/2022)   Humiliation, Afraid, Rape, and Kick questionnaire    Fear of Current or Ex-Partner: No    Emotionally Abused: No    Physically Abused: No    Sexually Abused: No   Family History  Problem Relation Age of Onset   Diabetes Mother    Asthma Mother    Breast cancer Mother    Colon polyps Mother    Hypertension Father    Stroke Father    Heart failure Brother    Stroke Brother    Ovarian cancer Maternal Grandmother    Hypertension Maternal Grandfather    Heart attack Maternal Grandfather    Colon cancer Neg Hx    BP 110/66  Pulse 91   SpO2 90%   Wt  Readings from Last 3 Encounters:  04/03/23 120 kg (264 lb 8 oz)  04/02/23 120.4 kg (265 lb 6.9 oz)  03/19/23 120.8 kg (266 lb 5.1 oz)   PHYSICAL EXAM: General:  NAD. No resp difficulty, arrived in [facility] HEENT: Normal Neck: Supple. No JVD. Carotids 2+ bilat; no bruits. No lymphadenopathy or thryomegaly appreciated. Cor: PMI nondisplaced. Regular rate & rhythm. No rubs, gallops or murmurs. Lungs: Faint crackles in bases Abdomen: Soft, obese, nontender, nondistended. No hepatosplenomegaly. No bruits or masses. Good bowel sounds. Extremities: No cyanosis, clubbing, rash, 2-3+ BLE edema Neuro: Alert & oriented x 3, cranial nerves grossly intact. Moves all 4 extremities w/o difficulty. Affect pleasant.  ASSESSMENT & PLAN: 1.  Chronic diastolic CHF with RV failure:  Suspect RV strain from prior PEs.  Echo in 5/23 showed EF 55-60%, RV severely reduced, RVSP 75 mmHg, moderate to severe TR.  Echo in 9/23 showed EF 55-60%, mildly decreased RV function with mild-moderate RV enlargement, unable to estimate PA systolic pressure, IVC normal.  RHC in 10/23 showed moderate pulmonary arterial hypertension.  Echo (8/24) showed hyperdynamic LV with EF 70-75%, RV normal, unable to estimate PA pressure. He is volume overloaded today on exam and by ReDs clip. NYHA III-IIIb recently, functional status is complicated by poor mobility due to spinal stenosis.  - Increase torsemide to 60 mg daily, start 20 KCL daily. BMET and BNP today. - Take metolazone 2.5 mg + extra 40 KCL with torsemide dose tomorrow morning. - Continue spironolactone 12.5 mg daily.  - Hold off on SGLT2i with recurrent UTIs.  2. Pulmonary hypertension: RHC in 10/23 showed moderate pulmonary arterial hypertension.  Based on history, this seems most likely to be chronic thromboembolic pulmonary hypertension.  He was found to have elevations in RF and ESR but negative CCP antibody.  Workup by rheumatology did not show signs of active inflammatory disease on  exam. V/Q scan was finally done in 7/24, this was consistent with chronic pulmonary emboli in the right lung. Echo (8/24) showed hyperdynamic LV, RV read as normal, PA pressures unable to be estimated. - Continue Eliquis for h/o PEs. He looks pale today, check CBC - He has been referred to [facility] for consideration of balloon pulmonary angioplasty (would not be good candidate for pulmonary thromboendarterectomy). He has not heard from [facility], will reach out. - Continue CPAP for OSA.  - With suspected CTEPH, he has been on Adempas, only able to get to 2 mg tid.  - Continue Opsumit 10 mg daily.  - Check BNP.   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 Discussed with Dr. [name] and is reasonable. 3. Tricuspid regurgitation: Moderate to severe on echo 05/23. Minimal on echo in 9/23. None on most recent echo (8/24) 4. History PE/DVT: PE/DVT 09/21 s/p TPA, required pressor support with NE. Recurrent PE in 5/23 off Eliquis. Now concern for chronic thromboembolic disease as above.  - Will need lifelong anticoagulation, on Eliquis 5 bid. No bleeding issues. As above, check CBC today. - He completed pulmonary rehab.  5. CKD stage 3: Baseline SCr 1.3.  BMET today.  6. HTN: BP controlled.  7. Spinal stenosis: Limited mobility at baseline.  8. OSA: Continue CPAP.  9. Obesity: There is no height or weight on file to calculate BMI. He has stopped his Mounjaro due to side  effects. - Of note, most recent albumin 2.9, total protein 5.8. PCP following.  Follow up in 2 weeks with APP. If volume not improved, will need to discuss RHC.  Anderson Malta [facility] FNP-BC 09/18/2023

## 2023-09-17 IMAGING — US US ABDOMEN COMPLETE
1 series · 13 of 25 positions shown · non-contrast
Comparison: None Available.

CLINICAL DATA: Acute kidney injury.  Elevated liver enzymes.

EXAM:
ABDOMEN ULTRASOUND COMPLETE

[Series 1: us abdomen complete · 13 of 81 slices shown]
[im 1/81]
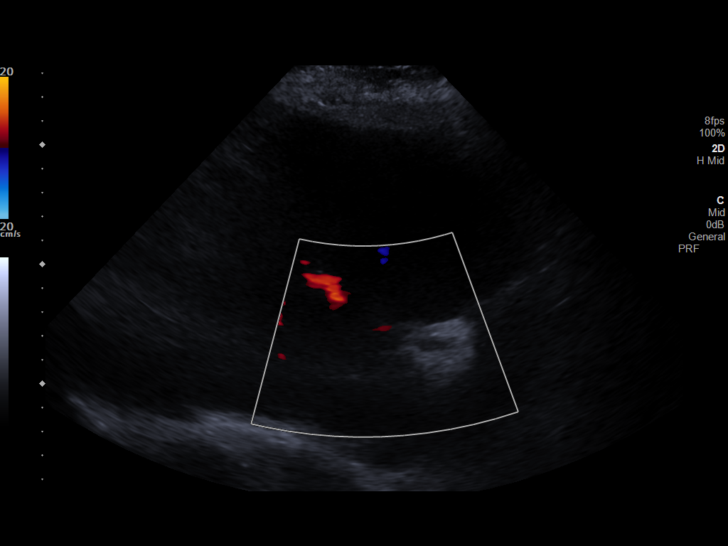
[im 7/81]
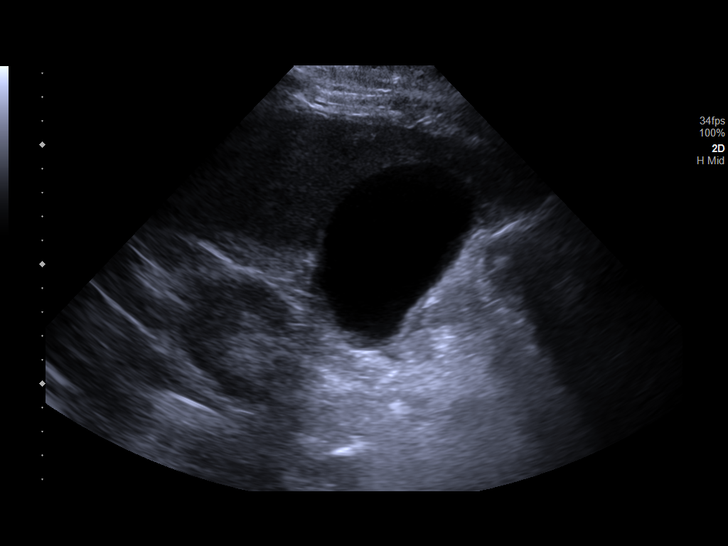
[im 14/81]
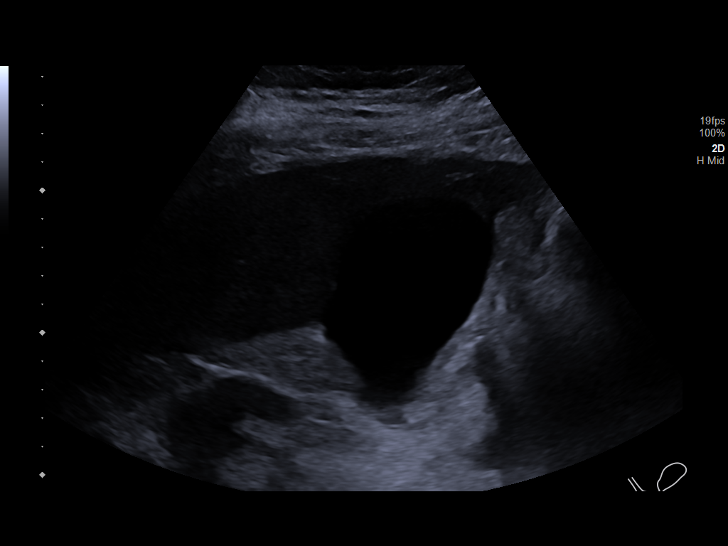
[im 21/81]
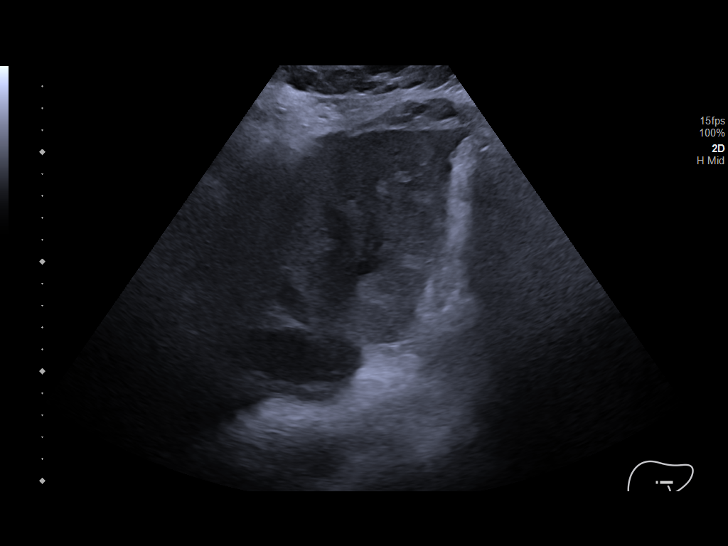
[im 27/81]
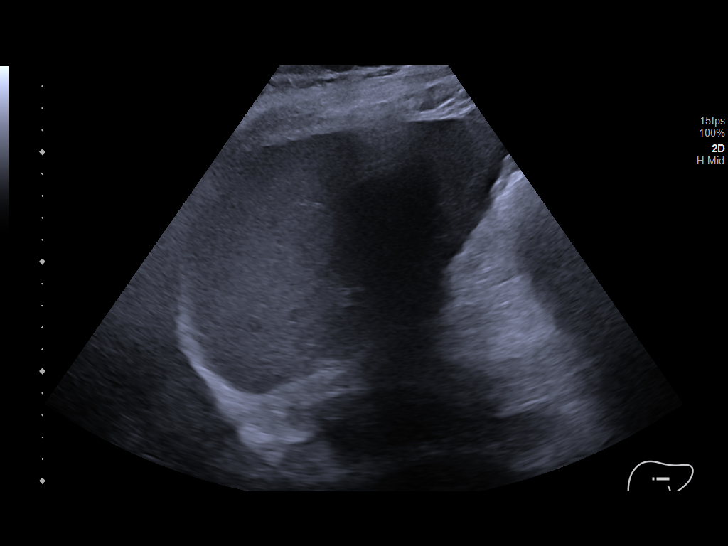
[im 34/81]
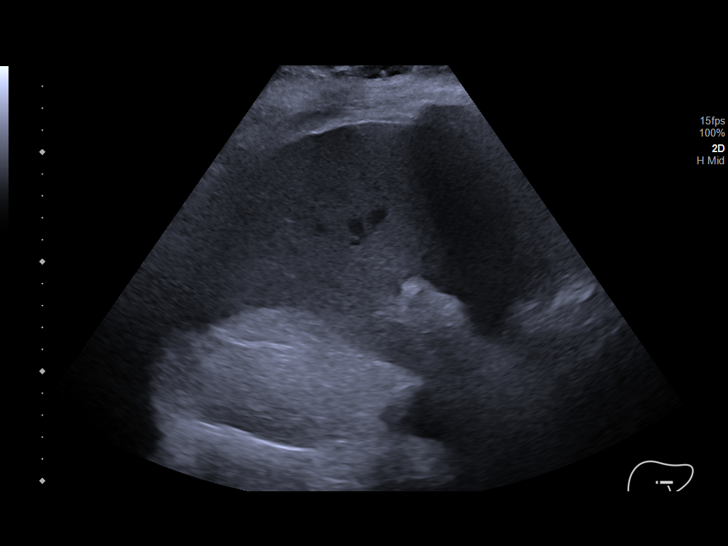
[im 41/81]
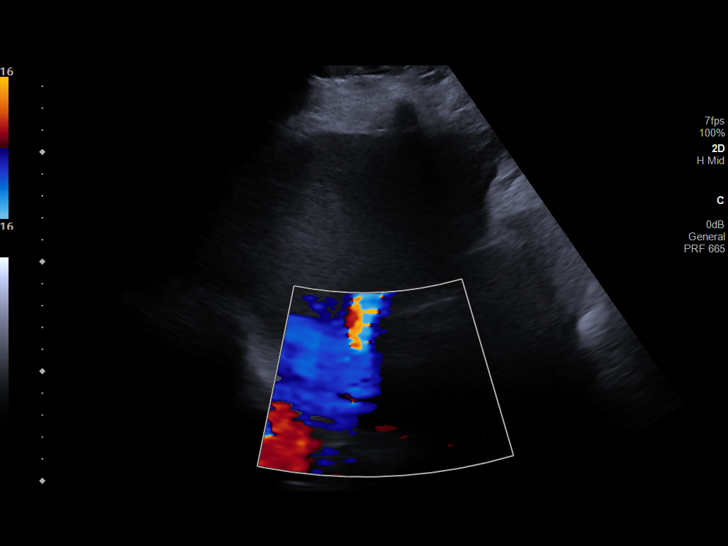
[im 47/81]
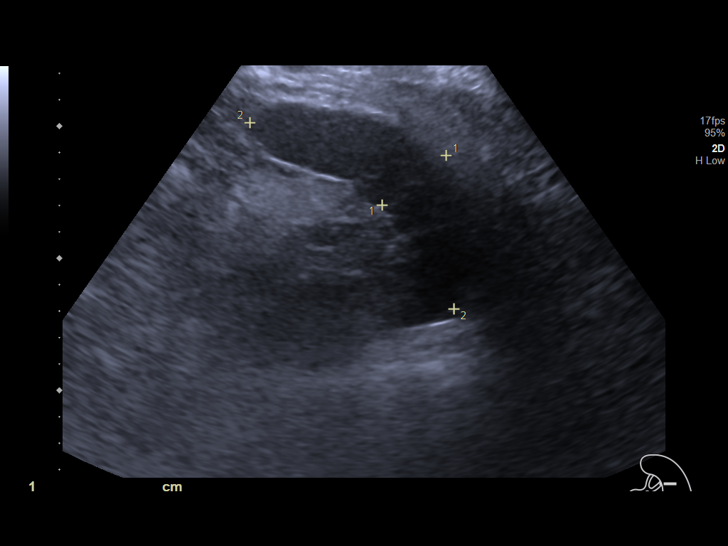
[im 54/81]
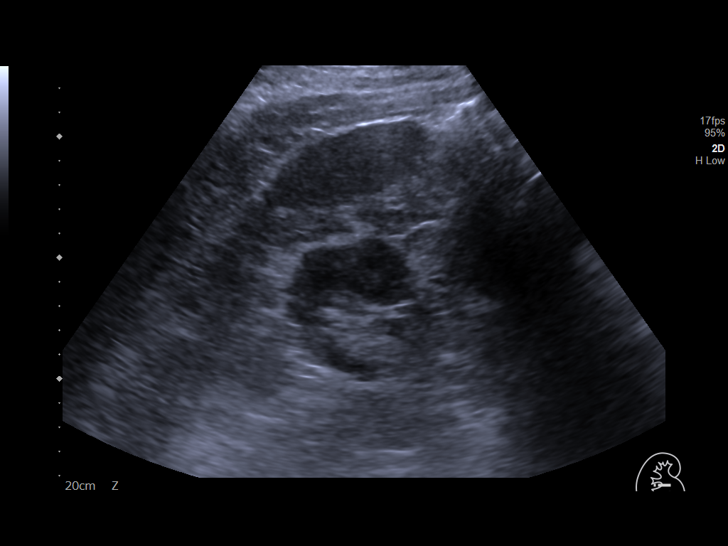
[im 61/81]
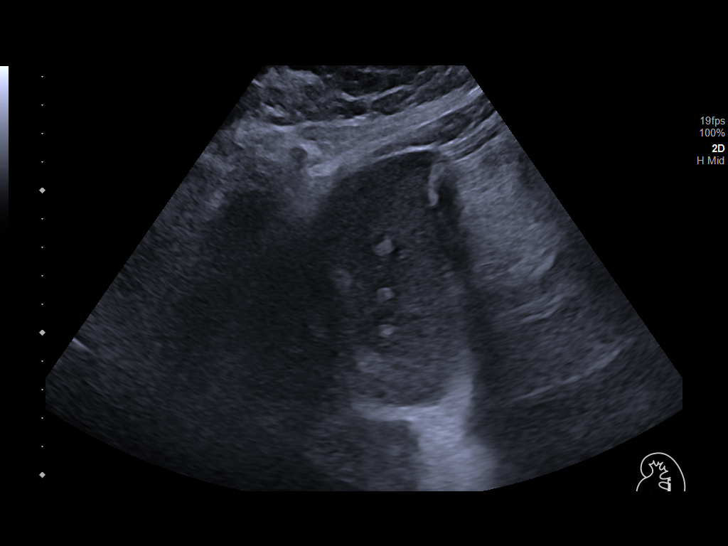
[im 67/81]
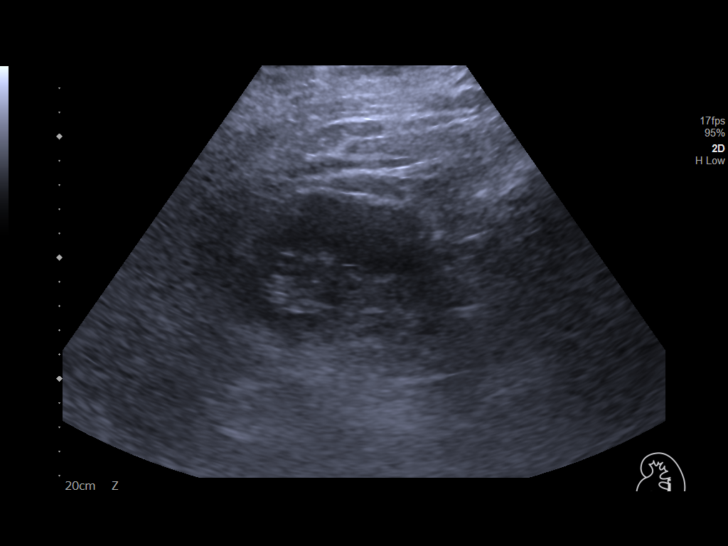
[im 74/81]
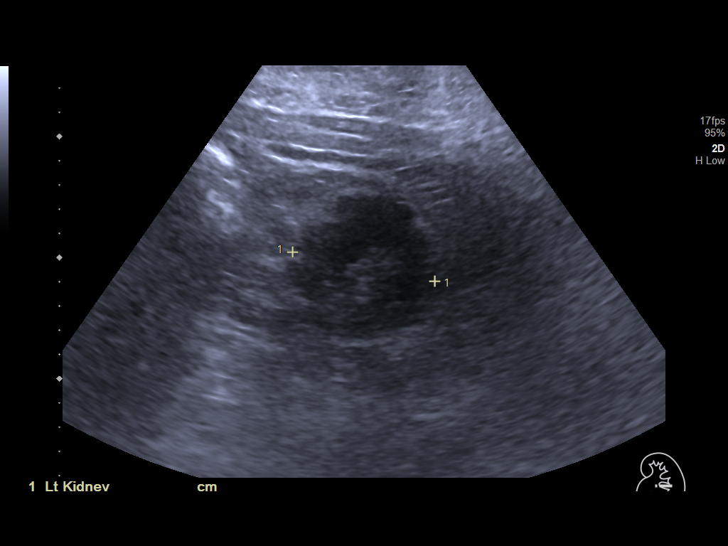
[im 81/81]
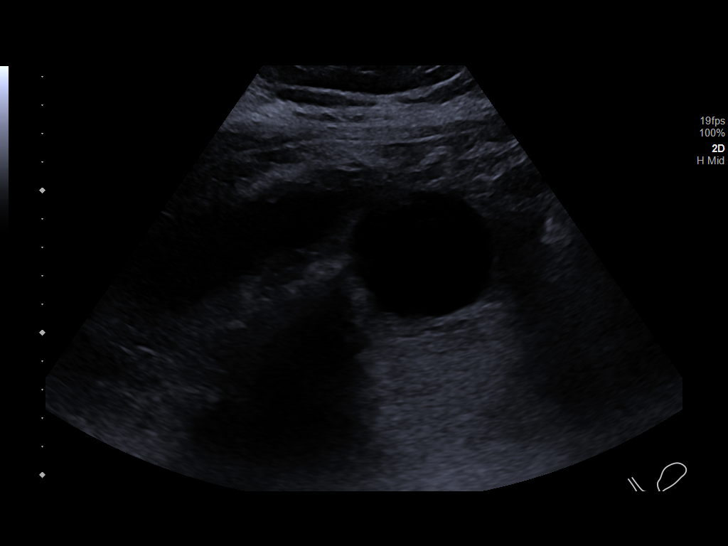

[13 of 25 positions shown; findings below may reference images not displayed]

FINDINGS: Gallbladder: No gallstones or wall thickening visualized. No
sonographic Murphy sign noted by sonographer.

Common bile duct: Diameter: 5-6 mm

Liver: Coarsening of hepatic echotexture suggests fatty deposition.
No focal intrahepatic parenchymal abnormality evident. Portal vein
is patent on color Doppler imaging with normal direction of blood
flow towards the liver.

IVC: No abnormality visualized.

Pancreas: Visualized portion unremarkable.

Spleen: Size and appearance within normal limits.

Right Kidney: Length: 10.1 cm. Echogenicity within normal limits. No
mass or hydronephrosis visualized.

Left Kidney: Length: 12.2 cm . Echogenicity within normal limits. No
mass or hydronephrosis visualized.17 mm simple cyst evident similar
to CT scan from 06/26/2020.

Abdominal aorta: Obscured by overlying bowel gas.

Other findings: No evidence for intraperitoneal free fluid.
IMPRESSION: 1. No acute findings. There is a small cyst in the left kidney,
similar to CT scan from 06/26/2020.
2. Coarsening of hepatic echotexture suggests fatty deposition.

## 2023-09-18 ENCOUNTER — Other Ambulatory Visit (HOSPITAL_BASED_OUTPATIENT_CLINIC_OR_DEPARTMENT_OTHER): Payer: Self-pay

## 2023-09-18 ENCOUNTER — Other Ambulatory Visit (HOSPITAL_COMMUNITY): Payer: Self-pay

## 2023-09-18 ENCOUNTER — Encounter (HOSPITAL_COMMUNITY): Payer: Self-pay

## 2023-09-18 ENCOUNTER — Other Ambulatory Visit: Payer: Self-pay

## 2023-09-18 ENCOUNTER — Ambulatory Visit (HOSPITAL_BASED_OUTPATIENT_CLINIC_OR_DEPARTMENT_OTHER)
Admission: RE | Admit: 2023-09-18 | Discharge: 2023-09-18 | Disposition: A | Discharge status: Home / Self Care | Payer: Medicare Other | Source: Ambulatory Visit | Attending: Family Medicine | Admitting: Family Medicine

## 2023-09-18 ENCOUNTER — Inpatient Hospital Stay (HOSPITAL_COMMUNITY)
Admission: EM | Admit: 2023-09-18 | Discharge: 2023-09-25 | DRG: 377 | Disposition: A | Discharge status: Skilled Nursing Facility | Payer: Medicare Other | Attending: Family Medicine | Admitting: Family Medicine

## 2023-09-18 ENCOUNTER — Emergency Department (HOSPITAL_COMMUNITY): Payer: Medicare Other

## 2023-09-18 VITALS — BP 110/66 | HR 91

## 2023-09-18 DIAGNOSIS — K623 Rectal prolapse: Secondary | ICD-10-CM | POA: Diagnosis not present

## 2023-09-18 DIAGNOSIS — K644 Residual hemorrhoidal skin tags: Secondary | ICD-10-CM | POA: Diagnosis not present

## 2023-09-18 DIAGNOSIS — Z86718 Personal history of other venous thrombosis and embolism: Secondary | ICD-10-CM | POA: Insufficient documentation

## 2023-09-18 DIAGNOSIS — Z7901 Long term (current) use of anticoagulants: Secondary | ICD-10-CM

## 2023-09-18 DIAGNOSIS — J9601 Acute respiratory failure with hypoxia: Secondary | ICD-10-CM

## 2023-09-18 DIAGNOSIS — Z6834 Body mass index (BMI) 34.0-34.9, adult: Secondary | ICD-10-CM

## 2023-09-18 DIAGNOSIS — D649 Anemia, unspecified: Secondary | ICD-10-CM | POA: Diagnosis not present

## 2023-09-18 DIAGNOSIS — I361 Nonrheumatic tricuspid (valve) insufficiency: Secondary | ICD-10-CM | POA: Insufficient documentation

## 2023-09-18 DIAGNOSIS — I10 Essential (primary) hypertension: Secondary | ICD-10-CM

## 2023-09-18 DIAGNOSIS — R195 Other fecal abnormalities: Secondary | ICD-10-CM | POA: Diagnosis not present

## 2023-09-18 DIAGNOSIS — I130 Hypertensive heart and chronic kidney disease with heart failure and stage 1 through stage 4 chronic kidney disease, or unspecified chronic kidney disease: Secondary | ICD-10-CM | POA: Insufficient documentation

## 2023-09-18 DIAGNOSIS — D124 Benign neoplasm of descending colon: Secondary | ICD-10-CM | POA: Diagnosis present

## 2023-09-18 DIAGNOSIS — K635 Polyp of colon: Secondary | ICD-10-CM

## 2023-09-18 DIAGNOSIS — N1832 Chronic kidney disease, stage 3b: Secondary | ICD-10-CM | POA: Diagnosis present

## 2023-09-18 DIAGNOSIS — Z825 Family history of asthma and other chronic lower respiratory diseases: Secondary | ICD-10-CM

## 2023-09-18 DIAGNOSIS — Z886 Allergy status to analgesic agent status: Secondary | ICD-10-CM

## 2023-09-18 DIAGNOSIS — G4733 Obstructive sleep apnea (adult) (pediatric): Secondary | ICD-10-CM | POA: Insufficient documentation

## 2023-09-18 DIAGNOSIS — I5033 Acute on chronic diastolic (congestive) heart failure: Secondary | ICD-10-CM | POA: Diagnosis present

## 2023-09-18 DIAGNOSIS — I509 Heart failure, unspecified: Secondary | ICD-10-CM | POA: Diagnosis not present

## 2023-09-18 DIAGNOSIS — E669 Obesity, unspecified: Secondary | ICD-10-CM | POA: Diagnosis present

## 2023-09-18 DIAGNOSIS — I272 Pulmonary hypertension, unspecified: Secondary | ICD-10-CM | POA: Insufficient documentation

## 2023-09-18 DIAGNOSIS — Z83719 Family history of colon polyps, unspecified: Secondary | ICD-10-CM

## 2023-09-18 DIAGNOSIS — N183 Chronic kidney disease, stage 3 unspecified: Secondary | ICD-10-CM | POA: Insufficient documentation

## 2023-09-18 DIAGNOSIS — Z833 Family history of diabetes mellitus: Secondary | ICD-10-CM

## 2023-09-18 DIAGNOSIS — L89152 Pressure ulcer of sacral region, stage 2: Secondary | ICD-10-CM | POA: Diagnosis present

## 2023-09-18 DIAGNOSIS — M48061 Spinal stenosis, lumbar region without neurogenic claudication: Secondary | ICD-10-CM | POA: Diagnosis present

## 2023-09-18 DIAGNOSIS — Z87891 Personal history of nicotine dependence: Secondary | ICD-10-CM

## 2023-09-18 DIAGNOSIS — K5730 Diverticulosis of large intestine without perforation or abscess without bleeding: Secondary | ICD-10-CM | POA: Diagnosis not present

## 2023-09-18 DIAGNOSIS — K529 Noninfective gastroenteritis and colitis, unspecified: Secondary | ICD-10-CM

## 2023-09-18 DIAGNOSIS — K6289 Other specified diseases of anus and rectum: Secondary | ICD-10-CM | POA: Diagnosis not present

## 2023-09-18 DIAGNOSIS — M4800 Spinal stenosis, site unspecified: Secondary | ICD-10-CM | POA: Insufficient documentation

## 2023-09-18 DIAGNOSIS — Z66 Do not resuscitate: Secondary | ICD-10-CM | POA: Diagnosis present

## 2023-09-18 DIAGNOSIS — Z79899 Other long term (current) drug therapy: Secondary | ICD-10-CM

## 2023-09-18 DIAGNOSIS — Z888 Allergy status to other drugs, medicaments and biological substances status: Secondary | ICD-10-CM

## 2023-09-18 DIAGNOSIS — M21962 Unspecified acquired deformity of left lower leg: Secondary | ICD-10-CM | POA: Diagnosis present

## 2023-09-18 DIAGNOSIS — E875 Hyperkalemia: Secondary | ICD-10-CM

## 2023-09-18 DIAGNOSIS — Z751 Person awaiting admission to adequate facility elsewhere: Secondary | ICD-10-CM

## 2023-09-18 DIAGNOSIS — Z1152 Encounter for screening for COVID-19: Secondary | ICD-10-CM

## 2023-09-18 DIAGNOSIS — Z8711 Personal history of peptic ulcer disease: Secondary | ICD-10-CM

## 2023-09-18 DIAGNOSIS — I071 Rheumatic tricuspid insufficiency: Secondary | ICD-10-CM | POA: Diagnosis present

## 2023-09-18 DIAGNOSIS — D500 Iron deficiency anemia secondary to blood loss (chronic): Secondary | ICD-10-CM

## 2023-09-18 DIAGNOSIS — E1122 Type 2 diabetes mellitus with diabetic chronic kidney disease: Secondary | ICD-10-CM | POA: Diagnosis present

## 2023-09-18 DIAGNOSIS — Z8674 Personal history of sudden cardiac arrest: Secondary | ICD-10-CM

## 2023-09-18 DIAGNOSIS — K648 Other hemorrhoids: Secondary | ICD-10-CM | POA: Diagnosis not present

## 2023-09-18 DIAGNOSIS — Z22322 Carrier or suspected carrier of Methicillin resistant Staphylococcus aureus: Secondary | ICD-10-CM

## 2023-09-18 DIAGNOSIS — I2724 Chronic thromboembolic pulmonary hypertension: Secondary | ICD-10-CM | POA: Diagnosis present

## 2023-09-18 DIAGNOSIS — D120 Benign neoplasm of cecum: Secondary | ICD-10-CM | POA: Diagnosis present

## 2023-09-18 DIAGNOSIS — I5032 Chronic diastolic (congestive) heart failure: Secondary | ICD-10-CM | POA: Insufficient documentation

## 2023-09-18 DIAGNOSIS — Z993 Dependence on wheelchair: Secondary | ICD-10-CM

## 2023-09-18 DIAGNOSIS — Z86711 Personal history of pulmonary embolism: Secondary | ICD-10-CM | POA: Insufficient documentation

## 2023-09-18 DIAGNOSIS — M549 Dorsalgia, unspecified: Secondary | ICD-10-CM | POA: Diagnosis present

## 2023-09-18 DIAGNOSIS — E876 Hypokalemia: Secondary | ICD-10-CM | POA: Diagnosis present

## 2023-09-18 DIAGNOSIS — L89312 Pressure ulcer of right buttock, stage 2: Secondary | ICD-10-CM | POA: Diagnosis present

## 2023-09-18 DIAGNOSIS — K222 Esophageal obstruction: Secondary | ICD-10-CM | POA: Diagnosis present

## 2023-09-18 DIAGNOSIS — N1831 Chronic kidney disease, stage 3a: Secondary | ICD-10-CM | POA: Diagnosis not present

## 2023-09-18 DIAGNOSIS — K642 Third degree hemorrhoids: Secondary | ICD-10-CM

## 2023-09-18 DIAGNOSIS — Z7985 Long-term (current) use of injectable non-insulin antidiabetic drugs: Secondary | ICD-10-CM

## 2023-09-18 DIAGNOSIS — D62 Acute posthemorrhagic anemia: Secondary | ICD-10-CM | POA: Diagnosis present

## 2023-09-18 DIAGNOSIS — K319 Disease of stomach and duodenum, unspecified: Secondary | ICD-10-CM | POA: Diagnosis not present

## 2023-09-18 DIAGNOSIS — K3189 Other diseases of stomach and duodenum: Secondary | ICD-10-CM | POA: Diagnosis present

## 2023-09-18 DIAGNOSIS — G40909 Epilepsy, unspecified, not intractable, without status epilepticus: Secondary | ICD-10-CM | POA: Diagnosis present

## 2023-09-18 DIAGNOSIS — G894 Chronic pain syndrome: Secondary | ICD-10-CM | POA: Diagnosis present

## 2023-09-18 DIAGNOSIS — N179 Acute kidney failure, unspecified: Secondary | ICD-10-CM | POA: Diagnosis present

## 2023-09-18 DIAGNOSIS — K921 Melena: Secondary | ICD-10-CM | POA: Diagnosis not present

## 2023-09-18 DIAGNOSIS — D509 Iron deficiency anemia, unspecified: Secondary | ICD-10-CM

## 2023-09-18 DIAGNOSIS — M7989 Other specified soft tissue disorders: Secondary | ICD-10-CM | POA: Diagnosis present

## 2023-09-18 DIAGNOSIS — K5731 Diverticulosis of large intestine without perforation or abscess with bleeding: Principal | ICD-10-CM | POA: Diagnosis present

## 2023-09-18 DIAGNOSIS — K2961 Other gastritis with bleeding: Secondary | ICD-10-CM | POA: Diagnosis not present

## 2023-09-18 DIAGNOSIS — Z8249 Family history of ischemic heart disease and other diseases of the circulatory system: Secondary | ICD-10-CM

## 2023-09-18 LAB — CBC
HCT: 23.9 % — ABNORMAL LOW (ref 39.0–52.0)
HCT: 24.9 % — ABNORMAL LOW (ref 39.0–52.0)
Hemoglobin: 6.6 g/dL — CL (ref 13.0–17.0)
Hemoglobin: 6.9 g/dL — CL (ref 13.0–17.0)
MCH: 23.1 pg — ABNORMAL LOW (ref 26.0–34.0)
MCH: 23.4 pg — ABNORMAL LOW (ref 26.0–34.0)
MCHC: 27.6 g/dL — ABNORMAL LOW (ref 30.0–36.0)
MCHC: 27.7 g/dL — ABNORMAL LOW (ref 30.0–36.0)
MCV: 83.6 fL (ref 80.0–100.0)
MCV: 84.4 fL (ref 80.0–100.0)
Platelets: 413 10*3/uL — ABNORMAL HIGH (ref 150–400)
Platelets: 416 10*3/uL — ABNORMAL HIGH (ref 150–400)
RBC: 2.86 MIL/uL — ABNORMAL LOW (ref 4.22–5.81)
RBC: 2.95 MIL/uL — ABNORMAL LOW (ref 4.22–5.81)
RDW: 17.9 % — ABNORMAL HIGH (ref 11.5–15.5)
RDW: 17.8 % — ABNORMAL HIGH (ref 11.5–15.5)
WBC: 7.3 10*3/uL (ref 4.0–10.5)
WBC: 7.3 10*3/uL (ref 4.0–10.5)
nRBC: 0 % (ref 0.0–0.2)
nRBC: 0 % (ref 0.0–0.2)

## 2023-09-18 LAB — COMPREHENSIVE METABOLIC PANEL
ALT: 14 U/L (ref 0–44)
AST: 39 U/L (ref 15–41)
Albumin: 3.3 g/dL — ABNORMAL LOW (ref 3.5–5.0)
Alkaline Phosphatase: 68 U/L (ref 38–126)
Anion gap: 14 (ref 5–15)
BUN: 25 mg/dL — ABNORMAL HIGH (ref 8–23)
CO2: 22 mmol/L (ref 22–32)
Calcium: 10.5 mg/dL — ABNORMAL HIGH (ref 8.9–10.3)
Chloride: 112 mmol/L — ABNORMAL HIGH (ref 98–111)
Creatinine, Ser: 1.4 mg/dL — ABNORMAL HIGH (ref 0.61–1.24)
GFR, Estimated: 54 mL/min — ABNORMAL LOW (ref >60)
Glucose, Bld: 119 mg/dL — ABNORMAL HIGH (ref 70–99)
Potassium: 5.9 mmol/L — ABNORMAL HIGH (ref 3.5–5.1)
Sodium: 148 mmol/L — ABNORMAL HIGH (ref 135–145)
Total Bilirubin: 0.7 mg/dL (ref <1.2)
Total Protein: 7.2 g/dL (ref 6.5–8.1)

## 2023-09-18 LAB — PREPARE RBC (CROSSMATCH)

## 2023-09-18 LAB — RESP PANEL BY RT-PCR (RSV, FLU A&B, COVID)  RVPGX2
Influenza A by PCR: NEGATIVE
Influenza B by PCR: NEGATIVE
Resp Syncytial Virus by PCR: NEGATIVE
SARS Coronavirus 2 by RT PCR: NEGATIVE

## 2023-09-18 LAB — BASIC METABOLIC PANEL
Anion gap: 5 (ref 5–15)
BUN: 27 mg/dL — ABNORMAL HIGH (ref 8–23)
CO2: 22 mmol/L (ref 22–32)
Calcium: 9.8 mg/dL (ref 8.9–10.3)
Chloride: 113 mmol/L — ABNORMAL HIGH (ref 98–111)
Creatinine, Ser: 1.37 mg/dL — ABNORMAL HIGH (ref 0.61–1.24)
GFR, Estimated: 55 mL/min — ABNORMAL LOW (ref >60)
Glucose, Bld: 122 mg/dL — ABNORMAL HIGH (ref 70–99)
Potassium: 4.3 mmol/L (ref 3.5–5.1)
Sodium: 140 mmol/L (ref 135–145)

## 2023-09-18 LAB — BRAIN NATRIURETIC PEPTIDE
B Natriuretic Peptide: 207.8 pg/mL — ABNORMAL HIGH (ref 0.0–100.0)
B Natriuretic Peptide: 172.3 pg/mL — ABNORMAL HIGH (ref 0.0–100.0)

## 2023-09-18 LAB — POC OCCULT BLOOD, ED: Fecal Occult Bld: POSITIVE — AB

## 2023-09-18 LAB — TROPONIN I (HIGH SENSITIVITY): Troponin I (High Sensitivity): 9 ng/L (ref <18)

## 2023-09-18 MED ORDER — METOLAZONE 2.5 MG PO TABS
2.5000 mg | ORAL_TABLET | Freq: Once | ORAL | 0 refills | Status: DC
  Filled 2023-09-18 (×2): qty 1, 1d supply, fill #0

## 2023-09-18 MED ORDER — SODIUM CHLORIDE 0.9% FLUSH
3.0000 mL | Freq: Two times a day (BID) | INTRAVENOUS | Status: DC
  Administered 2023-09-18: 3 mL via INTRAVENOUS

## 2023-09-18 MED ORDER — ACETAMINOPHEN 325 MG PO TABS
650.0000 mg | ORAL_TABLET | Freq: Four times a day (QID) | ORAL | Status: DC | PRN
Start: 2023-09-18 — End: 2023-09-26

## 2023-09-18 MED ORDER — VITAMIN D 25 MCG (1000 UNIT) PO TABS
1000.0000 [IU] | ORAL_TABLET | Freq: Every day | ORAL | Status: DC
Start: 2023-09-18 — End: 2023-09-26
  Administered 2023-09-18 – 2023-09-25 (×8): 1000 [IU] via ORAL
  Filled 2023-09-18 (×8): qty 1

## 2023-09-18 MED ORDER — PANTOPRAZOLE SODIUM 40 MG IV SOLR
40.0000 mg | Freq: Two times a day (BID) | INTRAVENOUS | Status: DC
  Administered 2023-09-18 – 2023-09-21 (×6): 40 mg via INTRAVENOUS
  Filled 2023-09-18 (×6): qty 10

## 2023-09-18 MED ORDER — SODIUM CHLORIDE 0.9 % IV SOLN: 250.0000 mL | INTRAVENOUS | Status: DC | PRN

## 2023-09-18 MED ORDER — TRAZODONE HCL 50 MG PO TABS
25.0000 mg | ORAL_TABLET | Freq: Every evening | ORAL | Status: DC | PRN
  Administered 2023-09-18 – 2023-09-19 (×2): 50 mg via ORAL
  Filled 2023-09-18 (×3): qty 1

## 2023-09-18 MED ORDER — POTASSIUM CHLORIDE CRYS ER 20 MEQ PO TBCR
20.0000 meq | EXTENDED_RELEASE_TABLET | Freq: Every day | ORAL | 3 refills | Status: DC
  Filled 2023-09-18 (×2): qty 90, 90d supply, fill #0

## 2023-09-18 MED ORDER — ROPINIROLE HCL 1 MG PO TABS
1.0000 mg | ORAL_TABLET | Freq: Every day | ORAL | Status: DC
Start: 2023-09-18 — End: 2023-09-26
  Administered 2023-09-18 – 2023-09-24 (×7): 1 mg via ORAL
  Filled 2023-09-18 (×7): qty 1

## 2023-09-18 MED ORDER — MACITENTAN 10 MG PO TABS: 10.0000 mg | ORAL_TABLET | Freq: Every day | ORAL | Status: DC

## 2023-09-18 MED ORDER — VITAMIN C 500 MG PO TABS
250.0000 mg | ORAL_TABLET | Freq: Every day | ORAL | Status: DC
  Administered 2023-09-19 – 2023-09-25 (×7): 250 mg via ORAL
  Filled 2023-09-18 (×7): qty 1

## 2023-09-18 MED ORDER — ALBUTEROL SULFATE (2.5 MG/3ML) 0.083% IN NEBU: 2.5000 mg | INHALATION_SOLUTION | RESPIRATORY_TRACT | Status: DC | PRN

## 2023-09-18 MED ORDER — ONDANSETRON HCL 4 MG PO TABS: 4.0000 mg | ORAL_TABLET | Freq: Four times a day (QID) | ORAL | Status: DC | PRN

## 2023-09-18 MED ORDER — FUROSEMIDE 10 MG/ML IJ SOLN
40.0000 mg | Freq: Once | INTRAMUSCULAR | Status: AC
  Administered 2023-09-18: 40 mg via INTRAVENOUS
  Filled 2023-09-18: qty 4

## 2023-09-18 MED ORDER — DULOXETINE HCL 30 MG PO CPEP
30.0000 mg | ORAL_CAPSULE | Freq: Every day | ORAL | Status: DC
  Administered 2023-09-19 – 2023-09-25 (×7): 30 mg via ORAL
  Filled 2023-09-18 (×7): qty 1

## 2023-09-18 MED ORDER — POLYETHYLENE GLYCOL 3350 17 G PO PACK
17.0000 g | PACK | Freq: Two times a day (BID) | ORAL | Status: DC
  Administered 2023-09-18 – 2023-09-25 (×10): 17 g via ORAL
  Filled 2023-09-18 (×10): qty 1

## 2023-09-18 MED ORDER — LEVETIRACETAM 250 MG PO TABS
250.0000 mg | ORAL_TABLET | Freq: Two times a day (BID) | ORAL | Status: DC
  Administered 2023-09-18 – 2023-09-25 (×14): 250 mg via ORAL
  Filled 2023-09-18 (×15): qty 1

## 2023-09-18 MED ORDER — GABAPENTIN 400 MG PO CAPS
800.0000 mg | ORAL_CAPSULE | Freq: Three times a day (TID) | ORAL | Status: AC
Start: 2023-09-18 — End: ?
  Administered 2023-09-18 – 2023-09-25 (×21): 800 mg via ORAL
  Filled 2023-09-18 (×21): qty 2

## 2023-09-18 MED ORDER — TORSEMIDE 20 MG PO TABS
60.0000 mg | ORAL_TABLET | Freq: Every day | ORAL | 2 refills | Status: DC
  Filled 2023-09-18 (×3): qty 135, 45d supply, fill #0

## 2023-09-18 MED ORDER — HYDROCODONE-ACETAMINOPHEN 7.5-325 MG PO TABS
1.0000 | ORAL_TABLET | Freq: Four times a day (QID) | ORAL | Status: DC
  Administered 2023-09-18 – 2023-09-25 (×25): 1 via ORAL
  Filled 2023-09-18 (×26): qty 1

## 2023-09-18 MED ORDER — ACETAMINOPHEN 650 MG RE SUPP: 650.0000 mg | Freq: Four times a day (QID) | RECTAL | Status: DC | PRN

## 2023-09-18 MED ORDER — SODIUM ZIRCONIUM CYCLOSILICATE 10 G PO PACK
10.0000 g | PACK | Freq: Every day | ORAL | Status: DC
  Filled 2023-09-18: qty 1

## 2023-09-18 MED ORDER — ROSUVASTATIN CALCIUM 20 MG PO TABS
20.0000 mg | ORAL_TABLET | Freq: Every day | ORAL | Status: DC
  Administered 2023-09-19 – 2023-09-25 (×7): 20 mg via ORAL
  Filled 2023-09-18 (×7): qty 1

## 2023-09-18 MED ORDER — ONDANSETRON HCL 4 MG/2ML IJ SOLN: 4.0000 mg | Freq: Four times a day (QID) | INTRAMUSCULAR | Status: DC | PRN

## 2023-09-18 MED ORDER — SODIUM CHLORIDE 0.9% FLUSH: 3.0000 mL | INTRAVENOUS | Status: DC | PRN

## 2023-09-18 MED ORDER — SENNOSIDES-DOCUSATE SODIUM 8.6-50 MG PO TABS
2.0000 | ORAL_TABLET | Freq: Every day | ORAL | Status: DC
  Administered 2023-09-18 – 2023-09-24 (×5): 2 via ORAL
  Filled 2023-09-18 (×5): qty 2

## 2023-09-18 MED ORDER — MACITENTAN 10 MG PO TABS
10.0000 mg | ORAL_TABLET | Freq: Every day | ORAL | Status: DC
  Administered 2023-09-19 – 2023-09-25 (×7): 10 mg via ORAL
  Filled 2023-09-18 (×7): qty 1

## 2023-09-18 MED ORDER — NALOXONE HCL 4 MG/0.1ML NA LIQD: 1.0000 | NASAL | Status: DC | PRN

## 2023-09-18 MED ORDER — RIOCIGUAT 2 MG PO TABS: 2.0000 mg | ORAL_TABLET | Freq: Two times a day (BID) | ORAL | Status: DC

## 2023-09-18 MED ORDER — FUROSEMIDE 40 MG PO TABS
80.0000 mg | ORAL_TABLET | Freq: Two times a day (BID) | ORAL | Status: DC
  Administered 2023-09-18: 80 mg via ORAL
  Filled 2023-09-18: qty 2

## 2023-09-18 MED ORDER — CARVEDILOL 12.5 MG PO TABS
12.5000 mg | ORAL_TABLET | Freq: Two times a day (BID) | ORAL | Status: DC
  Administered 2023-09-19 – 2023-09-25 (×13): 12.5 mg via ORAL
  Filled 2023-09-18 (×13): qty 1

## 2023-09-18 MED ORDER — SODIUM CHLORIDE 0.9% IV SOLUTION: Freq: Once | INTRAVENOUS | Status: AC

## 2023-09-18 MED ORDER — SPIRONOLACTONE 25 MG PO TABS
25.0000 mg | ORAL_TABLET | Freq: Every day | ORAL | Status: DC
  Administered 2023-09-20 – 2023-09-25 (×6): 25 mg via ORAL
  Filled 2023-09-18 (×7): qty 1

## 2023-09-18 MED ORDER — FUROSEMIDE 10 MG/ML IJ SOLN: 20.0000 mg | Freq: Once | INTRAMUSCULAR | Status: DC

## 2023-09-18 NOTE — H&P (Addendum)
History and Physical    Patient: Keith [name] QIO:[id number] DOB: 1951/02/28 DOA: 09/18/2023 DOS: the patient was seen and examined on 09/18/2023 PCP: [place], [name], MD  Patient coming from: Home  Chief Complaint:  Chief Complaint  Patient presents with   Shortness of Breath   HPI: Keith [name] is a 72 y.o. male with medical history significant of saddle PE/DVT requiring tPA in 2021-recurrent PE in 2023 after stopping DOAC, chronic HFpEF with RV failure, DM-2, HTN, spinal stenosis requiring numerous back surgeries-mostly wheelchair-bound was evaluated by outpatient cardiology today for complaints of worsening shortness of breath-thought to be volume overloaded but found to have worsening anemia on routine labs and sent to the ED for further evaluation and treatment.  Per patient-shortness of breath has been ongoing x 2 weeks-he has poor mobility at baseline due to spinal stenosis-and is minimally ambulatory.  He denies any orthopnea.  During this time-he has had progressive lower extremity swelling up to his lower thighs.  There is no fever or cough.  Cardiology thought patient was volume overloaded-plans were to escalate dosing of diuretics.  However he was called by cardiology later today-and was asked to go to the emergency room for blood transfusion as he was found to be severely anemic.  Patient denies any black stools-however his significant other at bedside did not think that he did have some dark-colored stools over the past several days-however patient thinks it is dark green in color.  He however has had intermittent small-volume hematochezia for the past several days.  There is no evidence of hematemesis.    Upon initial presentation to the emergency room-he was found to be mildly hypoxic-was 87% on room air requiring 2 L of oxygen to maintain saturations in the 90s.  Hemoglobin was found to be 6.9-he was found to have guaiac positive stools-1 unit of PRBC was ordered-GI was  consulted-TRH was asked to admit this patient for further evaluation and treatment.  No fever No headache No chest pain No nausea vomiting or diarrhea No abdominal pain.  Review of Systems: As mentioned in the history of present illness. All other systems reviewed and are negative. Past Medical History:  Diagnosis Date   Allergy    CHF (congestive heart failure) (HCC)    Chronic back pain    Diabetes mellitus without complication (HCC)    DNR (do not resuscitate) 11/19/2020   Duodenal ulcer hemorrhage 08/29/2014   ED (erectile dysfunction)    Esophageal stricture 08/30/2014   Glucose intolerance (impaired glucose tolerance)    Hiatal hernia 08/30/2014   Hypertension    Hypokalemia 04/11/2013   MRSA carrier 08/30/2014   Obesity    Osteoarthritis    Pneumonia    Pulmonary embolism (HCC) 06/2020   Scoliosis 2016   Spinal stenosis of lumbar region    Urinary tract infection 04/11/2013   Past Surgical History:  Procedure Laterality Date   BACK SURGERY  08/07/2021   x 2   COLONOSCOPY  2008   ESOPHAGOGASTRODUODENOSCOPY N/A 08/29/2014   Procedure: ESOPHAGOGASTRODUODENOSCOPY (EGD);  Surgeon: Hart Carwin, MD;  Location: [facility];  Service: Endoscopy;  Laterality: N/A;   LAMINECTOMY N/A 07/28/2018   Procedure: THORACIC ELEVEN- THORACIC TWELVE POSTERIOR DECOMPRESSION LAMINECTOMY;  Surgeon: Jadene Pierini, MD;  Location: [facility] OR;  Service: Neurosurgery;  Laterality: N/A;   LUMBAR LAMINECTOMY     NASAL POLYP SURGERY     ORIF ANKLE FRACTURE Right 07/01/2020   Procedure: OPEN REDUCTION INTERNAL FIXATION (ORIF) ANKLE FRACTURE.  TRIMALLEOLAR;  Surgeon: Teryl Lucy, MD;  Location: [facility] OR;  Service: Orthopedics;  Laterality: Right;   RIGHT HEART CATH N/A 07/06/2022   Procedure: RIGHT HEART CATH;  Surgeon: Laurey Morale, MD;  Location: [facility] INVASIVE CV LAB;  Service: Cardiovascular;  Laterality: N/A;   RIGHT/LEFT HEART CATH AND CORONARY ANGIOGRAPHY N/A 03/02/2022   Procedure:  RIGHT/LEFT HEART CATH AND CORONARY ANGIOGRAPHY;  Surgeon: Orpah Cobb, MD;  Location: [facility] INVASIVE CV LAB;  Service: Cardiovascular;  Laterality: N/A;   TONSILLECTOMY     Social History:  reports that he quit smoking about 29 years ago. His smoking use included cigarettes. He started smoking about 54 years ago. He has a 25 pack-year smoking history. He has never been exposed to tobacco smoke. He has never used smokeless tobacco. He reports current alcohol use. He reports that he does not currently use drugs.  Allergies  Allergen Reactions   Aspirin Other (See Comments)    Irritates the stomach and the patient developed ulcers, also   Lisinopril Other (See Comments)    Caused a body ache    Family History  Problem Relation Age of Onset   Diabetes Mother    Asthma Mother    Breast cancer Mother    Colon polyps Mother    Hypertension Father    Stroke Father    Heart failure Brother    Stroke Brother    Ovarian cancer Maternal Grandmother    Hypertension Maternal Grandfather    Heart attack Maternal Grandfather    Colon cancer Neg Hx     Prior to Admission medications   Medication Sig Start Date End Date Taking? Authorizing Provider  acetaminophen (TYLENOL) 325 MG tablet Take 1-2 tablets (325-650 mg total) by mouth every 4 (four) hours as needed for mild pain. 09/06/21  Yes Love, Evlyn Kanner, PA-C  amLODipine (NORVASC) 10 MG tablet TAKE 1 TABLET BY MOUTH DAILY 02/04/23  Yes [place], [name], MD  carvedilol (COREG) 12.5 MG tablet TAKE 1 TABLET BY MOUTH TWICE  DAILY WITH MEALS 09/11/23  Yes Laurey Morale, MD  cholecalciferol (VITAMIN D3) 25 MCG (1000 UNIT) tablet Take 1,000 Units by mouth daily.   Yes [provider]  DULoxetine (CYMBALTA) 60 MG capsule TAKE 1 CAPSULE BY MOUTH EVERY DAY 09/16/23  Yes [place], [name], MD  ELIQUIS 5 MG TABS tablet TAKE 1 TABLET BY MOUTH TWICE A DAY 04/09/23  Yes Laurey Morale, MD  ferrous sulfate 325 (65 FE) MG tablet TAKE 1 TABLET BY MOUTH EVERY  DAY WITH BREAKFAST Patient taking differently: Take 325 mg by mouth daily as needed. 03/01/23  Yes [place], [name], MD  gabapentin (NEURONTIN) 800 MG tablet Take 1 tablet (800 mg total) by mouth 3 (three) times daily for neuropathy. 08/08/23  Yes   HYDROcodone-acetaminophen (NORCO) 7.5-325 MG tablet Take 1 tablet by mouth in the morning, at noon, in the evening, and at bedtime. 7.5 mg/375 mg   Yes [provider]  hydrocortisone (ANUSOL-HC) 2.5 % rectal cream Place 1 application. rectally as needed for hemorrhoids or anal itching.   Yes [provider]  levETIRAcetam (KEPPRA) 250 MG tablet TAKE 1 TABLET BY MOUTH TWICE  DAILY 06/12/23  Yes [place], [name], MD  lidocaine (LIDODERM) 5 % Place 1 patch onto the skin daily as needed (pain). Remove & Discard patch within 12 hours or as directed by MD   Yes [provider]  macitentan (OPSUMIT) 10 MG tablet Take 10 mg by mouth daily.  Yes [provider]  metolazone (ZAROXOLYN) 2.5 MG tablet Take 1 tablet (2.5 mg total) by mouth once for 1 dose. Take 2 potassium tablets with the Metolazone 09/18/23 09/19/23 Yes Milford, Anderson Malta, FNP  naloxone [facility]) nasal spray 4 mg/0.1 mL Place 1 spray into the nose as needed. 06/13/23  Yes   omeprazole (PRILOSEC) 40 MG capsule TAKE 1 CAPSULE BY MOUTH DAILY 02/22/23  Yes [place], [name], MD  polyethylene glycol (MIRALAX / GLYCOLAX) 17 g packet Take 17 g by mouth daily as needed for mild constipation. 09/06/21  Yes Love, Evlyn Kanner, PA-C  potassium chloride SA (KLOR-CON M) 20 MEQ tablet Take 1 tablet (20 mEq total) by mouth daily. Patient taking differently: Take 40 mEq by mouth daily. 09/18/23  Yes Milford, Jessica M, FNP  Riociguat (ADEMPAS) 2 MG TABS Take 2 mg by mouth in the morning, at noon, and at bedtime. 06/24/23  Yes Laurey Morale, MD  rOPINIRole (REQUIP) 1 MG tablet TAKE 1 TABLET BY MOUTH AT  BEDTIME 05/06/23  Yes [place], [name], MD  rosuvastatin (CRESTOR) 20 MG tablet TAKE 1  TABLET BY MOUTH DAILY 09/11/23  Yes [place], [name], MD  spironolactone (ALDACTONE) 25 MG tablet TAKE 1/2 TABLET BY MOUTH EVERY DAY Patient taking differently: Take 25 mg by mouth daily. 09/03/23  Yes Laurey Morale, MD  torsemide (DEMADEX) 20 MG tablet Take 3 tablets (60 mg total) by mouth daily. 09/18/23  Yes Milford, Anderson Malta, FNP  traZODone (DESYREL) 50 MG tablet TAKE 1/2 TO 1 TABLET BY MOUTH AT BEDTIME AS NEEDED FOR SLEEP 07/31/23  Yes Lovorn, Aundra Millet, MD  vitamin C (ASCORBIC ACID) 250 MG tablet Take 250 mg by mouth daily.   Yes [provider]  Blood Glucose Monitoring Suppl (ACCU-CHEK GUIDE ME) w/Device KIT USE TO TEST BLOOD SUGARS 1-2 TIMES DAILY. 03/06/22   [place], [name], MD  tirzepatide [facility]) 2.5 MG/0.5ML Pen Inject 2.5 mg into the skin once a week. Patient not taking: Reported on 09/18/2023 05/08/23   Laurey Morale, MD    Physical Exam: Vitals:   09/18/23 1700 09/18/23 1730 09/18/23 1800 09/18/23 1806  BP: 123/66 111/64 112/60   Pulse: 89 88 91   Resp: 15 16 20    Temp:    98 F (36.7 C)  TempSrc:    Oral  SpO2: 99% 100% 98%   Weight:      Height:       Gen Exam:Alert awake-not in any distress HEENT:atraumatic, normocephalic Chest: B/L clear to auscultation anteriorly CVS:S1S2 regular Abdomen:soft non tender, non distended Extremities:+++ edema Neurology: Non focal Skin: no rash   Data Reviewed:    Latest Ref Rng & Units 09/18/2023    3:07 PM 09/18/2023   11:16 AM 07/01/2023   10:24 AM  CBC  WBC 4.0 - 10.5 K/uL 7.3  7.3  5.9   Hemoglobin 13.0 - 17.0 g/dL 6.9  6.6  C 8.5 Repeated and verified X2.   Hematocrit 39.0 - 52.0 % 24.9  23.9  26.6   Platelets 150 - 400 K/uL 416  413  484.0     C Corrected result       Latest Ref Rng & Units 09/18/2023    3:07 PM 09/18/2023   11:16 AM 07/01/2023   10:24 AM  BMP  Glucose 70 - 99 mg/dL 562  130  94   BUN 8 - 23 mg/dL 25  27  35   Creatinine 0.61 - 1.24 mg/dL 8.65  1.37  1.31   Sodium 135 - 145  mmol/L 148  140  141   Potassium 3.5 - 5.1 mmol/L 5.9  4.3  4.1   Chloride 98 - 111 mmol/L 112  113  108   CO2 22 - 32 mmol/L 22  22  23    Calcium 8.9 - 10.3 mg/dL 16.1  9.8  9.1      Assessment and Plan: Severe symptomatic anemia-concern for slow GI bleeding/chronic blood loss Sounds like he has been having "dark green" or dark-colored stools for the past several days-he has also held low-volume hematochezia for the past several days (per prior records-has a history of hemorrhoids).  Suspect this could be a slow upper GI bleeding. Being transfused 1 unit of PRBC here in the ED-follow-up posttransfusion CBC Hold anticoagulation-last dose of Eliquis was earlier this morning. PPI twice daily GI eval for endoscopy before resumption of anticoagulation-[facility] to see tomorrow morning N.p.o. postmidnight  Acute on chronic HFpEF Acute chronic RV failure/chronic thromboembolic pulmonary hypertension (CTEPH) Volume overloaded on exam IV Lasix 80 mg twice daily Continue usual dosing of Aldactone Continue Adempas/macitentan Strict weights/intake output/daily labs.  Acute hypoxic respiratory failure Likely secondary to HFpEF exacerbation/possible OSA physiology Diuresing with IV Lasix CPAP Attempt to titrate off oxygen over the next several days  History of recurrent VTE (saddle PE requiring tPA/vasopressor support in 2021-subsequent recurrent PE 2023 while off Eliquis) Temporarily holding Eliquis given severity of GI bleeding-and potential need for endoscopic evaluation   HTN Continue amlodipine/Coreg  Hyperkalemia Mild Hold potassium supplements Lokelma x 1 dose Will be on IV Lasix Recheck electrolytes tomorrow  CKD stage IIIb Close to baseline Follow electrolytes closely  History of cord compression/spinal stenosis-s/p multiple back surgeries Mostly bed to wheelchair bound PT/OT eval  Chronic pain syndrome Resume usual dosing of narcotics along with Neurontin and  Cymbalta Will place on bowel regimen with MiraLAX/senna  ?  Seizure disorder Keppra  Sacral decubitus ulcer Unclear what stage Present prior to admission Follows with wound care/general surgery Wound care RN evaluation tomorrow morning.  OSA CPAP   Advance Care Planning:   Code Status: Full Code   Consults: GI  Family Communication: Significant other at bedside  Severity of Illness: The appropriate patient status for this patient is INPATIENT. Inpatient status is judged to be reasonable and necessary in order to provide the required intensity of service to ensure the patient's safety. The patient's presenting symptoms, physical exam findings, and initial radiographic and laboratory data in the context of their chronic comorbidities is felt to place them at high risk for further clinical deterioration. Furthermore, it is not anticipated that the patient will be medically stable for discharge from the hospital within 2 midnights of admission.   * I certify that at the point of admission it is my clinical judgment that the patient will require inpatient hospital care spanning beyond 2 midnights from the point of admission due to high intensity of service, high risk for further deterioration and high frequency of surveillance required.*  Author: Jeoffrey Massed, MD 09/18/2023 6:19 PM  For on call review [email or web address].

## 2023-09-18 NOTE — Progress Notes (Signed)
ReDS Vest / Clip - 09/18/23 1100       ReDS Vest / Clip   Station Marker C    Ruler Value 30    ReDS Value Range High volume overload    ReDS Actual Value 45

## 2023-09-18 NOTE — Progress Notes (Signed)
Brief Note  [facility]  Aware of patient, agree with holding Eliquis. Continue to monitor hgb with transfusion as needed.  We will formally consult tomorrow morning.  Hyacinth Meeker, PA-C

## 2023-09-18 NOTE — ED Notes (Signed)
Lasix 20mg  IVP delayed as scheduled on 1600 as pt was getting blood transfusion.

## 2023-09-18 NOTE — Patient Instructions (Signed)
INCREASE your Torsemide to 60 mg daily  START Potassium 20 mEq ( 1Tab) daily.  TAKE 2.5 mg of Metolazone today and take 2 potassium tablets with it.  Labs done today, your results will be available in MyChart, we will contact you for abnormal readings.  Your physician recommends that you schedule a follow-up appointment in 2 weeks with the nurse practitioner and 3 months with Dr. [name]   If you have any questions or concerns before your next appointment please send [place] a message through [facility] or call our office at [phone].    TO LEAVE A MESSAGE FOR THE NURSE SELECT OPTION 2, PLEASE LEAVE A MESSAGE INCLUDING: YOUR NAME DATE OF BIRTH CALL BACK NUMBER REASON FOR CALL**this is important as we prioritize the call backs  YOU WILL RECEIVE A CALL BACK THE SAME DAY AS LONG AS YOU CALL BEFORE 4:00 PM  At the [facility], you and your health needs are our priority. As part of our continuing mission to provide you with exceptional heart care, we have created designated Provider Care Teams. These Care Teams include your primary Cardiologist (physician) and Advanced Practice Providers (APPs- Physician Assistants and Nurse Practitioners) who all work together to provide you with the care you need, when you need it.   You may see any of the following providers on your designated Care Team at your next follow up: Dr Arvilla Meres Dr Marca Ancona Dr. Dorthula Nettles Dr. Clearnce Hasten Amy Filbert Schilder, NP Robbie Lis, [place] [facility] [place], [place] Brynda Peon, NP [place] Lee, NP Karle Plumber, PharmD   Please be sure to bring in all your medications bottles to every appointment.    Thank you for choosing [facility]

## 2023-09-18 NOTE — ED Notes (Signed)
ED TO INPATIENT HANDOFF REPORT  ED Nurse Name and Phone #: Sheilah Mins [phone]  S Name/Age/Gender Keith [name] 72 y.o. male Room/Bed: 033C/033C  Code Status   Code Status: Prior  Home/SNF/Other Home Patient oriented to: self, place, time, and situation Is this baseline? Yes   Triage Complete: Triage complete  Chief Complaint Anemia [D64.9]  Triage Note Shortness of breath x 2 weeks. Labs done today at cardiologist's office. Was called back to go to ED for blood transfusion. Alert and oriented x 4. Takes Eliquis. 84% on RA. EMS started on 4LPM [place] and increased to 96%.    Allergies Allergies  Allergen Reactions   Aspirin Other (See Comments)    Irritates the stomach and the patient developed ulcers, also   Lisinopril Other (See Comments)    Caused a body ache    Level of Care/Admitting Diagnosis ED Disposition     ED Disposition  Admit   Condition  --   Comment  Hospital Area: [facility] [[id number]]  Level of Care: Telemetry Medical [[id number]]  May admit patient to [facility] or [facility] if equivalent level of care is available:: No  Covid Evaluation: Asymptomatic - no recent exposure (last 10 days) testing not required  Diagnosis: Anemia [962952]  Admitting Physician: Maretta Bees [[id number]]  Attending Physician: Maretta Bees [[id number]]  Certification:: I certify this patient will need inpatient services for at least 2 midnights  Expected Medical Readiness: 09/20/2023          B Medical/Surgery History Past Medical History:  Diagnosis Date   Allergy    CHF (congestive heart failure) (HCC)    Chronic back pain    Diabetes mellitus without complication (HCC)    DNR (do not resuscitate) 11/19/2020   Duodenal ulcer hemorrhage 08/29/2014   ED (erectile dysfunction)    Esophageal stricture 08/30/2014   Glucose intolerance (impaired glucose tolerance)    Hiatal hernia 08/30/2014   Hypertension    Hypokalemia 04/11/2013   MRSA  carrier 08/30/2014   Obesity    Osteoarthritis    Pneumonia    Pulmonary embolism (HCC) 06/2020   Scoliosis 2016   Spinal stenosis of lumbar region    Urinary tract infection 04/11/2013   Past Surgical History:  Procedure Laterality Date   BACK SURGERY  08/07/2021   x 2   COLONOSCOPY  2008   ESOPHAGOGASTRODUODENOSCOPY N/A 08/29/2014   Procedure: ESOPHAGOGASTRODUODENOSCOPY (EGD);  Surgeon: Hart Carwin, MD;  Location: [facility];  Service: Endoscopy;  Laterality: N/A;   LAMINECTOMY N/A 07/28/2018   Procedure: THORACIC ELEVEN- THORACIC TWELVE POSTERIOR DECOMPRESSION LAMINECTOMY;  Surgeon: Jadene Pierini, MD;  Location: [facility] OR;  Service: Neurosurgery;  Laterality: N/A;   LUMBAR LAMINECTOMY     NASAL POLYP SURGERY     ORIF ANKLE FRACTURE Right 07/01/2020   Procedure: OPEN REDUCTION INTERNAL FIXATION (ORIF) ANKLE FRACTURE. TRIMALLEOLAR;  Surgeon: Teryl Lucy, MD;  Location: [facility] OR;  Service: Orthopedics;  Laterality: Right;   RIGHT HEART CATH N/A 07/06/2022   Procedure: RIGHT HEART CATH;  Surgeon: Laurey Morale, MD;  Location: [facility] INVASIVE CV LAB;  Service: Cardiovascular;  Laterality: N/A;   RIGHT/LEFT HEART CATH AND CORONARY ANGIOGRAPHY N/A 03/02/2022   Procedure: RIGHT/LEFT HEART CATH AND CORONARY ANGIOGRAPHY;  Surgeon: Orpah Cobb, MD;  Location: [facility] INVASIVE CV LAB;  Service: Cardiovascular;  Laterality: N/A;   TONSILLECTOMY       A IV Location/Drains/Wounds Patient Lines/Drains/Airways Status     Active Line/Drains/Airways  Name Placement date Placement time Site Days   Peripheral IV 09/18/23 20 G Right Antecubital 09/18/23  1506  Antecubital  less than 1            Intake/Output Last 24 hours  Intake/Output Summary (Last 24 hours) at 09/18/2023 1659 Last data filed at 09/18/2023 1618 Gross per 24 hour  Intake --  Output 500 ml  Net -500 ml    Labs/Imaging Results for orders placed or performed during the hospital encounter of 09/18/23 (from the  past 48 hour(s))  Brain natriuretic peptide     Status: Abnormal   Collection Time: 09/18/23  3:05 PM  Result Value Ref Range   B Natriuretic Peptide 172.3 (H) 0.0 - 100.0 pg/mL    Comment: Performed at [facility], [address], [place], [place] [address]  Comprehensive metabolic panel     Status: Abnormal   Collection Time: 09/18/23  3:07 PM  Result Value Ref Range   Sodium 148 (H) 135 - 145 mmol/L   Potassium 5.9 (H) 3.5 - 5.1 mmol/L    Comment: HEMOLYSIS AT THIS LEVEL MAY AFFECT RESULT   Chloride 112 (H) 98 - 111 mmol/L   CO2 22 22 - 32 mmol/L   Glucose, Bld 119 (H) 70 - 99 mg/dL    Comment: Glucose reference range applies only to samples taken after fasting for at least 8 hours.   BUN 25 (H) 8 - 23 mg/dL   Creatinine, Ser 6.29 (H) 0.61 - 1.24 mg/dL   Calcium 52.8 (H) 8.9 - 10.3 mg/dL   Total Protein 7.2 6.5 - 8.1 g/dL   Albumin 3.3 (L) 3.5 - 5.0 g/dL   AST 39 15 - 41 U/L    Comment: HEMOLYSIS AT THIS LEVEL MAY AFFECT RESULT   ALT 14 0 - 44 U/L    Comment: HEMOLYSIS AT THIS LEVEL MAY AFFECT RESULT   Alkaline Phosphatase 68 38 - 126 U/L   Total Bilirubin 0.7 <1.2 mg/dL    Comment: HEMOLYSIS AT THIS LEVEL MAY AFFECT RESULT   GFR, Estimated 54 (L) >60 mL/min    Comment: (NOTE) Calculated using the CKD-EPI Creatinine Equation (2021)    Anion gap 14 5 - 15    Comment: Performed at [facility], [address] [place], [place] [address]  CBC     Status: Abnormal   Collection Time: 09/18/23  3:07 PM  Result Value Ref Range   WBC 7.3 4.0 - 10.5 K/uL   RBC 2.95 (L) 4.22 - 5.81 MIL/uL   Hemoglobin 6.9 (LL) 13.0 - 17.0 g/dL    Comment: REPEATED TO VERIFY THIS CRITICAL RESULT HAS VERIFIED AND BEEN CALLED TO KOHUT,N RN BY AMANDA LEONARD ON 12 11 2024 AT 1537, AND HAS BEEN READ BACK.     HCT 24.9 (L) 39.0 - 52.0 %   MCV 84.4 80.0 - 100.0 fL   MCH 23.4 (L) 26.0 - 34.0 pg   MCHC 27.7 (L) 30.0 - 36.0 g/dL   RDW 40.1 (H) 02.7 - 25.3 %   Platelets 416 (H) 150 - 400  K/uL   nRBC 0.0 0.0 - 0.2 %    Comment: Performed at [facility], [address], [place], [place] [address]  Type and screen [facility]     Status: None (Preliminary result)   Collection Time: 09/18/23  3:07 PM  Result Value Ref Range   ABO/RH(D) O POS    Antibody Screen NEG  Sample Expiration 09/21/2023,2359    Unit Number [id number]    Blood Component Type RED CELLS,LR    Unit division 00    Status of Unit ISSUED    Transfusion Status OK TO TRANSFUSE    Crossmatch Result      Compatible Performed at [facility], [address], [place], [place] [address]   Resp panel by RT-PCR (RSV, Flu A&B, Covid) Anterior Nasal Swab     Status: None   Collection Time: 09/18/23  3:07 PM   Specimen: Anterior Nasal Swab  Result Value Ref Range   SARS Coronavirus 2 by RT PCR NEGATIVE NEGATIVE   Influenza A by PCR NEGATIVE NEGATIVE   Influenza B by PCR NEGATIVE NEGATIVE    Comment: (NOTE) The Xpert Xpress SARS-CoV-2/FLU/RSV plus assay is intended as an aid in the diagnosis of influenza from Nasopharyngeal swab specimens and should not be used as a sole basis for treatment. Nasal washings and aspirates are unacceptable for Xpert Xpress SARS-CoV-2/FLU/RSV testing.  Fact Sheet for Patients: BloggerCourse.com  Fact Sheet for Healthcare Providers: SeriousBroker.it  This test is not yet approved or cleared by the [place] FDA and has been authorized for detection and/or diagnosis of SARS-CoV-2 by FDA under an Emergency Use Authorization (EUA). This EUA will remain in effect (meaning this test can be used) for the duration of the COVID-19 declaration under Section 564(b)(1) of the Act, 21 U.S.C. section 360bbb-3(b)(1), unless the authorization is terminated or revoked.     Resp Syncytial Virus by PCR NEGATIVE NEGATIVE    Comment: (NOTE) Fact Sheet for  Patients: BloggerCourse.com  Fact Sheet for Healthcare Providers: SeriousBroker.it  This test is not yet approved or cleared by the [place] FDA and has been authorized for detection and/or diagnosis of SARS-CoV-2 by FDA under an Emergency Use Authorization (EUA). This EUA will remain in effect (meaning this test can be used) for the duration of the COVID-19 declaration under Section 564(b)(1) of the Act, 21 U.S.C. section 360bbb-3(b)(1), unless the authorization is terminated or revoked.  Performed at [facility], [address], [place], [place] [address]   Troponin I (High Sensitivity)     Status: None   Collection Time: 09/18/23  3:07 PM  Result Value Ref Range   Troponin I (High Sensitivity) 9 <18 ng/L    Comment: (NOTE) Elevated high sensitivity troponin I (hsTnI) values and significant  changes across serial measurements may suggest ACS but many other  chronic and acute conditions are known to elevate hsTnI results.  Refer to the "Links" section for chest pain algorithms and additional  guidance. Performed at [facility], [address], [place], [place] [address]   POC occult blood, ED     Status: Abnormal   Collection Time: 09/18/23  3:34 PM  Result Value Ref Range   Fecal Occult Bld POSITIVE (A) NEGATIVE  Prepare RBC (crossmatch)     Status: None   Collection Time: 09/18/23  3:45 PM  Result Value Ref Range   Order Confirmation      ORDER PROCESSED BY BLOOD BANK Performed at [facility], [address], [place], [place] [address]    DG Chest Port 1 View  Result Date: 09/18/2023 CLINICAL DATA:  Two-week history of shortness of breath EXAM: PORTABLE CHEST 1 VIEW COMPARISON:  Chest radiograph dated 04/30/2023 FINDINGS: Normal lung volumes. No focal consolidations. Blunting of the left costophrenic angle. Mildly increased enlargement of the cardiomediastinal silhouette. No acute osseous  abnormality. IMPRESSION: 1. Increased cardiomegaly. 2. Blunting of the left costophrenic angle, which may reflect pleural effusion. Electronically Signed   By: Agustin Cree M.D.   On: 09/18/2023 16:30    Pending Labs Unresulted Labs (From admission, onward)    None       Vitals/Pain Today's Vitals   09/18/23 1530 09/18/23 1600 09/18/23 1616 09/18/23 1630  BP: 121/65 119/64 125/69 130/67  Pulse: 91 93 94 90  Resp: (!) 21 14 14 15   Temp:   98.4 F (36.9 C)   TempSrc:   Oral   SpO2: 98% 97% 97% 96%  Weight:      Height:      PainSc:        Isolation Precautions No active isolations  Medications Medications  furosemide (LASIX) injection 20 mg (has no administration in time range)  furosemide (LASIX) injection 40 mg (40 mg Intravenous Given 09/18/23 1541)  0.9 %  sodium chloride infusion (Manually program via Guardrails IV Fluids) ( Intravenous New Bag/Given 09/18/23 1640)    Mobility non-ambulatory     Focused Assessments Cardiac Assessment Handoff:  Cardiac Rhythm: Normal sinus rhythm Lab Results  Component Value Date   CKTOTAL 70 07/01/2023   No results found for: "DDIMER" Does the Patient currently have chest pain? No    R Recommendations: See Admitting Provider Note  Report given to:   Additional Notes:

## 2023-09-18 NOTE — ED Triage Notes (Signed)
Shortness of breath x 2 weeks. Labs done today at cardiologist's office. Was called back to go to ED for blood transfusion. Alert and oriented x 4. Takes Eliquis. 84% on RA. EMS started on 4LPM [place] and increased to 96%.

## 2023-09-18 NOTE — Progress Notes (Signed)
Monitoring by Pharmacy for Pulmonary Hypertension Treatment   Indication - Continuation of prior to admission medication   Patient is 72 y.o.  with history of PAH on chronic macitentan (OPSUMIT) PTA and will be continued while hospitalized.   Continuing this medication order as an inpatient requires that monitoring parameters per REMS requirements must be met.  Chronic therapy is under the supervision of Laurey Morale, MD  who is enrolled in the REMS program (REMS Provider ID [id number]) and is being notified of continuation of therapy. A staff message in EPIC has been sent notifying the certified prescriber.  The patient's authorization code or REMS patient ID as provided by the REMS program is N/a - male patient. Per patient report has previously been educated on Hepatotoxicity.  On admission pregnancy risk has been assessed and Patient is male - no monitoring required, and birth control option is appropriately not indicated as an option has been confirmed as birth control option for this patient.  Hepatic function has been evaluated. AST / ALT appropriate to continue medication at this time.     Latest Ref Rng & Units 09/18/2023    3:07 PM 07/01/2023   10:24 AM 10/23/2022   10:00 AM  Hepatic Function  Total Protein 6.5 - 8.1 g/dL 7.2  5.8  8.3   Albumin 3.5 - 5.0 g/dL 3.3  2.9  4.1   AST 15 - 41 U/L 39  19  29   ALT 0 - 44 U/L 14  20  22    Alk Phosphatase 38 - 126 U/L 68  129  72   Total Bilirubin <1.2 mg/dL 0.7  0.4  0.4   Bilirubin, Direct 0.0 - 0.3 mg/dL  0.1      If any question arise or pregnancy is identified during hospitalization, contact for bosentan: [phone]; macitentan: [phone]; ambrisentan: [phone].  Thank for you allowing [place] to participate in the care of this patient.  Cathie Hoops 09/18/2023, 7:03 PM Clinical Pharmacist  Guides for Male Patient:  ambrisentan (LETAIRIS), macitentan (OPSUMIT), bosentan (TRACLEER).

## 2023-09-18 NOTE — ED Notes (Signed)
Pulse O2 at 87% on RA. O2 initiated at 2LPM [place]. O2 increased to 97%. Will continue to monitor.

## 2023-09-18 NOTE — ED Provider Notes (Signed)
[place] EMERGENCY DEPARTMENT AT [facility] Provider Note   CSN: [id number] Arrival date & time: 09/18/23  1444     History  Chief Complaint  Patient presents with   Shortness of Breath    Keith [name] is a 72 y.o. male.  Pt is a 72 yo male with pmhx significant for chf, htn, obesity, spinal stenosis (nonambulatory due to this), DM, saddle PE and DVT PE (on Eliquis).  Pt has been feeling sob and weak for the past few days.  He had a scheduled appt with cards today and had a ReDS Vest Clip reading of 45 (25-30 in normal) which showed that he was fluid overloaded.  Cards recommended increasing torsemide.  They also drew blood and called him back when hgb was noted to be 6.6.  They told him to come to the ED.  Pt was 87% on RA.  Pt reports compliance with meds.  Pt has seen some blood in his stool when he has a hard bm, but denies any dark stools.              Home Medications Prior to Admission medications   Medication Sig Start Date End Date Taking? Authorizing Provider  acetaminophen (TYLENOL) 325 MG tablet Take 1-2 tablets (325-650 mg total) by mouth every 4 (four) hours as needed for mild pain. 09/06/21  Yes Love, Evlyn Kanner, PA-C  amLODipine (NORVASC) 10 MG tablet TAKE 1 TABLET BY MOUTH DAILY 02/04/23  Yes [place], [name], MD  carvedilol (COREG) 12.5 MG tablet TAKE 1 TABLET BY MOUTH TWICE  DAILY WITH MEALS 09/11/23  Yes Laurey Morale, MD  DULoxetine (CYMBALTA) 60 MG capsule TAKE 1 CAPSULE BY MOUTH EVERY DAY 09/16/23  Yes [place], [name], MD  ELIQUIS 5 MG TABS tablet TAKE 1 TABLET BY MOUTH TWICE A DAY 04/09/23  Yes Laurey Morale, MD  ferrous sulfate 325 (65 FE) MG tablet TAKE 1 TABLET BY MOUTH EVERY DAY WITH BREAKFAST Patient taking differently: Take 325 mg by mouth daily as needed. 03/01/23  Yes [place], [name], MD  gabapentin (NEURONTIN) 800 MG tablet Take 1 tablet (800 mg total) by mouth 3 (three) times daily for neuropathy. 08/08/23  Yes    HYDROcodone-acetaminophen (NORCO) 7.5-325 MG tablet Take 1 tablet by mouth in the morning, at noon, in the evening, and at bedtime. 7.5 mg/375 mg   Yes [provider]  hydrocortisone (ANUSOL-HC) 2.5 % rectal cream Place 1 application. rectally as needed for hemorrhoids or anal itching.   Yes [provider]  levETIRAcetam (KEPPRA) 250 MG tablet TAKE 1 TABLET BY MOUTH TWICE  DAILY 06/12/23  Yes [place], [name], MD  lidocaine (LIDODERM) 5 % Place 1 patch onto the skin daily as needed (pain). Remove & Discard patch within 12 hours or as directed by MD   Yes [provider]  macitentan (OPSUMIT) 10 MG tablet Take 10 mg by mouth daily.   Yes [provider]  metolazone (ZAROXOLYN) 2.5 MG tablet Take 1 tablet (2.5 mg total) by mouth once for 1 dose. Take 2 potassium tablets with the Metolazone 09/18/23 09/19/23 Yes Milford, Anderson Malta, FNP  naloxone [facility]) nasal spray 4 mg/0.1 mL Place 1 spray into the nose as needed. 06/13/23  Yes   omeprazole (PRILOSEC) 40 MG capsule TAKE 1 CAPSULE BY MOUTH DAILY 02/22/23  Yes [place], [name], MD  polyethylene glycol (MIRALAX / GLYCOLAX) 17 g packet Take 17 g by mouth daily as needed for mild  constipation. 09/06/21  Yes Love, Evlyn Kanner, PA-C  potassium chloride SA (KLOR-CON M) 20 MEQ tablet Take 1 tablet (20 mEq total) by mouth daily. Patient taking differently: Take 40 mEq by mouth daily. 09/18/23  Yes Milford, Jessica M, FNP  Riociguat (ADEMPAS) 2 MG TABS Take 2 mg by mouth in the morning, at noon, and at bedtime. 06/24/23  Yes Laurey Morale, MD  rOPINIRole (REQUIP) 1 MG tablet TAKE 1 TABLET BY MOUTH AT  BEDTIME 05/06/23  Yes [place], [name], MD  rosuvastatin (CRESTOR) 20 MG tablet TAKE 1 TABLET BY MOUTH DAILY 09/11/23  Yes [place], [name], MD  spironolactone (ALDACTONE) 25 MG tablet TAKE 1/2 TABLET BY MOUTH EVERY DAY Patient taking differently: Take 25 mg by mouth daily. 09/03/23  Yes Laurey Morale, MD  Blood Glucose Monitoring  Suppl (ACCU-CHEK GUIDE ME) w/Device KIT USE TO TEST BLOOD SUGARS 1-2 TIMES DAILY. 03/06/22   [place], [name], MD  gabapentin (NEURONTIN) 800 MG tablet Take 1 tablet (800 mg total) by mouth 3 (three) times daily for neuropathy. 09/03/23     naloxone (NARCAN) nasal spray 4 mg/0.1 mL Place 1 spray into the nose as needed (accidental overdose). 09/06/21   [provider]  tirzepatide Greggory Keen) 2.5 MG/0.5ML Pen Inject 2.5 mg into the skin once a week. 05/08/23   Laurey Morale, MD  torsemide (DEMADEX) 20 MG tablet Take 3 tablets (60 mg total) by mouth daily. 09/18/23   Milford, Anderson Malta, FNP  traZODone (DESYREL) 50 MG tablet TAKE 1/2 TO 1 TABLET BY MOUTH AT BEDTIME AS NEEDED FOR SLEEP 07/31/23   Lovorn, Aundra Millet, MD      Allergies    Aspirin and Lisinopril    Review of Systems   Review of Systems  Respiratory:  Positive for shortness of breath.   Cardiovascular:  Positive for leg swelling.  All other systems reviewed and are negative.   Physical Exam Updated Vital Signs BP 130/67   Pulse 90   Temp 98.4 F (36.9 C) (Oral)   Resp 15   Ht 5\' 10"  (1.778 m)   Wt 109.3 kg   SpO2 96%   BMI 34.58 kg/m  Physical Exam Vitals and nursing note reviewed.  Constitutional:      Appearance: He is well-developed. He is obese. He is ill-appearing.  HENT:     Head: Normocephalic and atraumatic.     Mouth/Throat:     Mouth: Mucous membranes are moist.     Pharynx: Oropharynx is clear.  Eyes:     Extraocular Movements: Extraocular movements intact.     Pupils: Pupils are equal, round, and reactive to light.  Cardiovascular:     Rate and Rhythm: Normal rate and regular rhythm.  Pulmonary:     Effort: Tachypnea present.     Breath sounds: Rhonchi present.  Abdominal:     General: Bowel sounds are normal.     Palpations: Abdomen is soft.     Tenderness: There is guarding.     Comments: Stool light brown  Musculoskeletal:        General: Normal range of motion.     Cervical back:  Normal range of motion and neck supple.     Right lower leg: Edema present.     Left lower leg: Edema present.  Skin:    General: Skin is warm.     Capillary Refill: Capillary refill takes less than 2 seconds.     Comments: Several small sacral decubs.  Neurological:  General: No focal deficit present.     Mental Status: He is alert and oriented to person, place, and time.  Psychiatric:        Mood and Affect: Mood normal.     ED Results / Procedures / Treatments   Labs (all labs ordered are listed, but only abnormal results are displayed) Labs Reviewed  COMPREHENSIVE METABOLIC PANEL - Abnormal; Notable for the following components:      Result Value   Sodium 148 (*)    Potassium 5.9 (*)    Chloride 112 (*)    Glucose, Bld 119 (*)    BUN 25 (*)    Creatinine, Ser 1.40 (*)    Calcium 10.5 (*)    Albumin 3.3 (*)    GFR, Estimated 54 (*)    All other components within normal limits  CBC - Abnormal; Notable for the following components:   RBC 2.95 (*)    Hemoglobin 6.9 (*)    HCT 24.9 (*)    MCH 23.4 (*)    MCHC 27.7 (*)    RDW 17.8 (*)    Platelets 416 (*)    All other components within normal limits  BRAIN NATRIURETIC PEPTIDE - Abnormal; Notable for the following components:   B Natriuretic Peptide 172.3 (*)    All other components within normal limits  POC OCCULT BLOOD, ED - Abnormal; Notable for the following components:   Fecal Occult Bld POSITIVE (*)    All other components within normal limits  RESP PANEL BY RT-PCR (RSV, FLU A&B, COVID)  RVPGX2  TYPE AND SCREEN  PREPARE RBC (CROSSMATCH)  TROPONIN I (HIGH SENSITIVITY)    EKG EKG Interpretation Date/Time:  Wednesday September 18 2023 15:12:38 EST Ventricular Rate:  95 PR Interval:  192 QRS Duration:  95 QT Interval:  386 QTC Calculation: 465 R Axis:   -19  Text Interpretation: Sinus rhythm Multiform ventricular premature complexes Borderline left axis deviation Low voltage, precordial leads No  significant change since last tracing Confirmed by Jacalyn Lefevre [phone]) on 09/18/2023 4:37:12 PM  Radiology DG Chest Port 1 View  Result Date: 09/18/2023 CLINICAL DATA:  Two-week history of shortness of breath EXAM: PORTABLE CHEST 1 VIEW COMPARISON:  Chest radiograph dated 04/30/2023 FINDINGS: Normal lung volumes. No focal consolidations. Blunting of the left costophrenic angle. Mildly increased enlargement of the cardiomediastinal silhouette. No acute osseous abnormality. IMPRESSION: 1. Increased cardiomegaly. 2. Blunting of the left costophrenic angle, which may reflect pleural effusion. Electronically Signed   By: Agustin Cree M.D.   On: 09/18/2023 16:30    Procedures Procedures    Medications Ordered in ED Medications  furosemide (LASIX) injection 20 mg (has no administration in time range)  furosemide (LASIX) injection 40 mg (40 mg Intravenous Given 09/18/23 1541)  0.9 %  sodium chloride infusion (Manually program via Guardrails IV Fluids) ( Intravenous New Bag/Given 09/18/23 1640)    ED Course/ Medical Decision Making/ A&P                                 Medical Decision Making Amount and/or Complexity of Data Reviewed Labs: ordered. Radiology: ordered.  Risk Prescription drug management. Decision regarding hospitalization.   This patient presents to the ED for concern of sob, this involves an extensive number of treatment options, and is a complaint that carries with it a high risk of complications and morbidity.  The differential diagnosis includes chf, copd, pna, covid/flu/rsv,  anemia   Co morbidities that complicate the patient evaluation  chf, htn, obesity, spinal stenosis (nonambulatory due to this), DM, saddle PE and DVT PE (on Eliquis)   Additional history obtained:  Additional history obtained from epic chart review External records from outside source obtained and reviewed including wife   Lab Tests:  I Ordered, and personally interpreted labs.  The  pertinent results include:  hgb 6.9 (8.5 in Sept, prior to that, it's been in the 10 range for months), cmp with k elevated at 5.9, bun 25 and cr 1.4 (chronic), bnp 172.3,    Imaging Studies ordered:  I ordered imaging studies including cxr  I independently visualized and interpreted imaging which showed  Increased cardiomegaly.  2. Blunting of the left costophrenic angle, which may reflect  pleural effusion.   I agree with the radiologist interpretation   Cardiac Monitoring:  The patient was maintained on a cardiac monitor.  I personally viewed and interpreted the cardiac monitored which showed an underlying rhythm of: nsr   Medicines ordered and prescription drug management:  I ordered medication including transfusion  for anemia and lasix for chf  Reevaluation of the patient after these medicines showed that the patient improved I have reviewed the patients home medicines and have made adjustments as needed   Critical Interventions:  Transfusion/oxygen   Consultations Obtained:  I requested consultation with [facility],  and discussed lab and imaging findings as well as pertinent plan - they will see pt in consult Pt d/w Dr. Jerral Ralph (triad) for admission.   Problem List / ED Course:  Symptomatic anemia:  1 unit prbc ordered for transfusion.  GI will see. CHF:  lasix ordered Hyperkalemia:  lasix given   Reevaluation:  After the interventions noted above, I reevaluated the patient and found that they have :improved   Social Determinants of Health:  Lives at home   Dispostion:  After consideration of the diagnostic results and the patients response to treatment, I feel that the patent would benefit from admission.    CRITICAL CARE Performed by: Jacalyn Lefevre   Total critical care time: 30 minutes  Critical care time was exclusive of separately billable procedures and treating other patients.  Critical care was necessary to treat or prevent imminent or  life-threatening deterioration.  Critical care was time spent personally by me on the following activities: development of treatment plan with patient and/or surrogate as well as nursing, discussions with consultants, evaluation of patient's response to treatment, examination of patient, obtaining history from patient or surrogate, ordering and performing treatments and interventions, ordering and review of laboratory studies, ordering and review of radiographic studies, pulse oximetry and re-evaluation of patient's condition.         Final Clinical Impression(s) / ED Diagnoses Final diagnoses:  Symptomatic anemia  Acute on chronic congestive heart failure, unspecified heart failure type (HCC)  Acute respiratory failure with hypoxia (HCC)  On apixaban therapy  Hyperkalemia  Stage 3a chronic kidney disease (HCC)    Rx / DC Orders ED Discharge Orders     None         Jacalyn Lefevre, MD 09/18/23 1706

## 2023-09-19 ENCOUNTER — Encounter (HOSPITAL_BASED_OUTPATIENT_CLINIC_OR_DEPARTMENT_OTHER): Payer: Medicare Other | Admitting: Internal Medicine

## 2023-09-19 DIAGNOSIS — D500 Iron deficiency anemia secondary to blood loss (chronic): Secondary | ICD-10-CM | POA: Diagnosis not present

## 2023-09-19 DIAGNOSIS — N1831 Chronic kidney disease, stage 3a: Secondary | ICD-10-CM

## 2023-09-19 DIAGNOSIS — I509 Heart failure, unspecified: Secondary | ICD-10-CM | POA: Diagnosis not present

## 2023-09-19 DIAGNOSIS — K921 Melena: Secondary | ICD-10-CM

## 2023-09-19 LAB — IRON AND TIBC
Iron: 19 ug/dL — ABNORMAL LOW (ref 45–182)
Saturation Ratios: 5 % — ABNORMAL LOW (ref 17.9–39.5)
TIBC: 350 ug/dL (ref 250–450)
UIBC: 331 ug/dL

## 2023-09-19 LAB — BASIC METABOLIC PANEL
Anion gap: 11 (ref 5–15)
BUN: 20 mg/dL (ref 8–23)
CO2: 23 mmol/L (ref 22–32)
Calcium: 9.8 mg/dL (ref 8.9–10.3)
Chloride: 108 mmol/L (ref 98–111)
Creatinine, Ser: 1.26 mg/dL — ABNORMAL HIGH (ref 0.61–1.24)
GFR, Estimated: >60 mL/min (ref >60)
Glucose, Bld: 139 mg/dL — ABNORMAL HIGH (ref 70–99)
Potassium: 3.6 mmol/L (ref 3.5–5.1)
Sodium: 142 mmol/L (ref 135–145)

## 2023-09-19 LAB — PREPARE RBC (CROSSMATCH)

## 2023-09-19 LAB — RETICULOCYTES
Immature Retic Fract: 30.8 % — ABNORMAL HIGH (ref 2.3–15.9)
RBC.: 2.94 MIL/uL — ABNORMAL LOW (ref 4.22–5.81)
Retic Count, Absolute: 60.9 10*3/uL (ref 19.0–186.0)
Retic Ct Pct: 2.1 % (ref 0.4–3.1)

## 2023-09-19 LAB — CBC
HCT: 25 % — ABNORMAL LOW (ref 39.0–52.0)
Hemoglobin: 7.3 g/dL — ABNORMAL LOW (ref 13.0–17.0)
MCH: 23.5 pg — ABNORMAL LOW (ref 26.0–34.0)
MCHC: 29.2 g/dL — ABNORMAL LOW (ref 30.0–36.0)
MCV: 80.6 fL (ref 80.0–100.0)
Platelets: 358 10*3/uL (ref 150–400)
RBC: 3.1 MIL/uL — ABNORMAL LOW (ref 4.22–5.81)
RDW: 17.8 % — ABNORMAL HIGH (ref 11.5–15.5)
WBC: 5.3 10*3/uL (ref 4.0–10.5)
nRBC: 0.4 % — ABNORMAL HIGH (ref 0.0–0.2)

## 2023-09-19 LAB — VITAMIN B12: Vitamin B-12: 318 pg/mL (ref 180–914)

## 2023-09-19 LAB — FOLATE: Folate: 18.4 ng/mL (ref >5.9)

## 2023-09-19 LAB — FERRITIN: Ferritin: 20 ng/mL — ABNORMAL LOW (ref 24–336)

## 2023-09-19 MED ORDER — RIOCIGUAT 2 MG PO TABS
2.0000 mg | ORAL_TABLET | Freq: Three times a day (TID) | ORAL | Status: DC
  Administered 2023-09-19 – 2023-09-25 (×20): 2 mg via ORAL
  Filled 2023-09-19 (×22): qty 1

## 2023-09-19 MED ORDER — SODIUM ZIRCONIUM CYCLOSILICATE 10 G PO PACK
10.0000 g | PACK | Freq: Once | ORAL | Status: AC
  Administered 2023-09-19: 10 g via ORAL
  Filled 2023-09-19: qty 1

## 2023-09-19 MED ORDER — SODIUM CHLORIDE 0.9% IV SOLUTION: Freq: Once | INTRAVENOUS | Status: AC

## 2023-09-19 MED ORDER — FUROSEMIDE 10 MG/ML IJ SOLN
80.0000 mg | Freq: Two times a day (BID) | INTRAMUSCULAR | Status: DC
  Administered 2023-09-19 – 2023-09-23 (×9): 80 mg via INTRAVENOUS
  Filled 2023-09-19 (×10): qty 8

## 2023-09-19 NOTE — Progress Notes (Signed)
   09/19/23 2045  BiPAP/CPAP/SIPAP  Reason BIPAP/CPAP not in use Non-compliant (refused, does not wear one)

## 2023-09-19 NOTE — Evaluation (Signed)
Occupational Therapy Evaluation Patient Details Name: Keith [name] MRN: [id number] DOB: 11-03-1950 Today's Date: 09/19/2023   History of Present Illness 72 yr old male admitted 12/11 with shortness of breath and LE edema Severe symptomatic anemia-concern for slow GI bleeding/chronic blood loss, Acute on chronic HFpEF, and Acute hypoxic respiratory failure.   Medical history significant of saddle PE/DVT requiring tPA in 2021-recurrent PE in 2023 after stopping DOAC, chronic HFpEF with RV failure, DM-2, HTN, spinal stenosis requiring numerous back surgeries-mostly wheelchair-bound   Clinical Impression   The pt is currently presenting below his baseline level of functioning for self-care management, give the below listed deficits (see OT problem list). He reported having significant weakness and shortness of breath over the past ~1 month; as such, he had difficulty performing ADLs & was largely bed bound during this time. At current, he requires max assist for lower body dressing, mod assist for bed mobility, and set-up assist for upper body dressing. He is also noted to be with deconditioning, B LE edema, subjective reports of increased B LE weakness, and compromised endurance.He will benefit from further OT services to maximize his independence with ADLs and to decrease the risk for further weakness and deconditioning. He appears motivated towards therapy, as good family support, and has good rehab potential. As such, OT anticipates the pt will benefit from intensive inpatient follow up therapy, >3 hours/day.      If plan is discharge home, recommend the following: A lot of help with bathing/dressing/bathroom;A lot of help with walking and/or transfers;Help with stairs or ramp for entrance;Assistance with cooking/housework;Assist for transportation    Functional Status Assessment  Patient has had a recent decline in their functional status and demonstrates the ability to make significant  improvements in function in a reasonable and predictable amount of time.  Equipment Recommendations  None recommended by OT    Recommendations for Other Services       Precautions / Restrictions Precautions Precautions: Fall Restrictions Weight Bearing Restrictions Per Provider Order: No      Mobility Bed Mobility Overal bed mobility: Needs Assistance Bed Mobility: Supine to Sit, Sit to Supine     Supine to sit: HOB elevated, Used rails, Mod assist Sit to supine: Mod assist   General bed mobility comments: required increased time and effort for bed mobility & assist for BLE management onto and off bed        Balance     Sitting balance-Leahy Scale: Fair        ADL either performed or assessed with clinical judgement   ADL Overall ADL's : Needs assistance/impaired Eating/Feeding: Independent;Sitting   Grooming: Set up;Sitting Grooming Details (indicate cue type and reason): simulated EOB         Upper Body Dressing : Set up;Supervision/safety;Sitting Upper Body Dressing Details (indicate cue type and reason): simulated EOB Lower Body Dressing: Maximal assistance;Total assistance Lower Body Dressing Details (indicate cue type and reason): pt limited by BLE weakness and was unable to perform sock management seated EOB                     Vision Baseline Vision/History: 1 Wears glasses Additional Comments: he correctly read the time depicted on the wall clock            Pertinent Vitals/Pain Pain Assessment Pain Assessment: No/denies pain     Extremity/Trunk Assessment Upper Extremity Assessment Upper Extremity Assessment: Overall WFL for tasks assessed;Generalized weakness;Right hand dominant (BUE AROM WFL. BUE grip strength 4/5)  Lower Extremity Assessment Lower Extremity Assessment: Generalized weakness; Required AAROM       Communication Communication Communication: No apparent difficulties   Cognition Arousal: Alert Behavior During  Therapy: WFL for tasks assessed/performed          General Comments: Oriented x4, able to follow commands, friendly                Home Living Family/patient expects to be discharged to:: Private residence Living Arrangements: Spouse/significant other Available Help at Discharge: Family Type of Home: House Home Access: Ramped entrance     Home Layout: Two level;Able to live on main level with bedroom/bathroom Alternate Level Stairs-Number of Steps: chair lift inside home   Bathroom Shower/Tub: Walk-in shower         Home Equipment: Agricultural consultant (2 wheels);BSC/3in1;Tub bench;Electric scooter;Transport chair;Wheelchair - power   Additional Comments: adjustable bed      Prior Functioning/Environment    Mobility Comments: Prior to ~1 month ago, he could use walker to stand and pivot into and out of wheelchair. Could move around house in wheelchair. Working out with Systems analyst at home. Has been limited by weakness and SOB over past month and limited ability to get out of bed.  ADLs Comments:Modified independent with dressing and toileting; often sponge bathed without assist, unless spouse assisted with showers. Wife does the cooking and cleaning.         OT Problem List: Decreased strength;Decreased range of motion;Impaired balance (sitting and/or standing);Increased edema      OT Treatment/Interventions: Self-care/ADL training;Therapeutic exercise;DME and/or AE instruction;Energy conservation;Patient/family education;Therapeutic activities;Balance training    OT Goals(Current goals can be found in the care plan section) Acute Rehab OT Goals Patient Stated Goal: to get stronger and to be as independent as possible OT Goal Formulation: With patient Time For Goal Achievement: 10/03/23 Potential to Achieve Goals: Good ADL Goals Pt Will Perform Lower Body Dressing: with supervision;with set-up;with adaptive equipment;sitting/lateral leans Pt Will Transfer to  Toilet: with supervision;bedside commode;stand pivot transfer Pt Will Perform Toileting - Clothing Manipulation and hygiene: with supervision;sit to/from stand Additional ADL Goal #1: Pt will perform bed mobility with supervision, in prep for progressive ADL participation.  OT Frequency: Min 1X/week       AM-PAC OT "6 Clicks" Daily Activity     Outcome Measure Help from another person eating meals?: None Help from another person taking care of personal grooming?: A Little Help from another person toileting, which includes using toliet, bedpan, or urinal?: A Lot Help from another person bathing (including washing, rinsing, drying)?: A Lot Help from another person to put on and taking off regular upper body clothing?: A Little Help from another person to put on and taking off regular lower body clothing?: A Lot 6 Click Score: 16   End of Session Equipment Utilized During Treatment: Oxygen Nurse Communication: Other (comment) (pt requesting assistance with toileting from nursing)  Activity Tolerance: Other (comment) (Fair tolerance) Patient left: in bed;with call bell/phone within reach;with bed alarm set  OT Visit Diagnosis: Muscle weakness (generalized) (M62.81);Unsteadiness on feet (R26.81);Other abnormalities of gait and mobility (R26.89)                Time: 2536-6440 OT Time Calculation (min): 21 min Charges:  OT General Charges $OT Visit: 1 Visit OT Evaluation $OT Eval Moderate Complexity: 1 Mod    Jabbar Palmero L Stellarose Cerny, OTR/L 09/19/2023, 5:33 PM

## 2023-09-19 NOTE — Progress Notes (Signed)
  Inpatient Rehab Admissions Coordinator :  Per therapy recommendations patient was screened for CIR candidacy by Ottie Glazier RN MSN. Patient is not yet at a level to tolerate the intensity required to pursue a CIR admit . Was at [facility] 08/21/2021 with thoracic myelopathy.  It is unlikely that [facility] medicare will approve CIR level rehab for current diagnosis.The CIR admissions team will follow and monitor for progress and place a Rehab Consult order if felt to be appropriate. Please contact me with any questions.  Ottie Glazier RN MSN Admissions Coordinator [phone]

## 2023-09-19 NOTE — Consult Note (Addendum)
Referring Provider: ? Primary Care Physician:  [place], [name], MD Primary Gastroenterologist:  Dr. Russella Dar  Reason for Consultation:  GI bleed  HPI: Keith [name] is a 72 y.o. male with medical history significant of saddle PE/DVT requiring tPA in 2021-recurrent PE in 2023 after stopping DOAC now on Eliquis ongoing, chronic HFpEF with RV failure, DM-2, HTN, spinal stenosis requiring numerous back surgeries-mostly wheelchair-bound was evaluated by outpatient cardiology today for complaints of worsening shortness of breath-thought to be volume overloaded but found to have worsening anemia on routine labs and sent to the ED for further evaluation and treatment.  Hemoglobin down to 6.6 g, but now 7.3 grams this AM after a unit of PRBCs.  Going to receive another unit of packed red blood cells.  Two months ago was 8.5 grams and baseline looks to be round 10.5-11 grams.  Hemoccult positive.  Reports minimal intermittent BRBPR and "dark green" stools.  Is on iron at home.  Is on daily PPI at home.  No NSAID use.  No abdominal pain.  Moves his bowels regularly.  Was on Mounjaro for less than a year and says that he lost 120 pounds.  It is on hold for now though because he was having a lot of nausea from it.  That is improved now since coming off of it.  Colonoscopy April 2019:  - One 8 mm polyp in the mid transverse colon, removed with a cold snare. Resected and retrieved. - A single ( solitary) ulcer in the distal transverse colon. Biopsied. - Moderate diverticulosis in the left colon. There was no evidence of diverticular bleeding. - Internal hemorrhoids. - The examination was otherwise normal on direct and retroflexion views.  1. Surgical [P], transverse, polyp - TUBULAR ADENOMA(S). - HIGH GRADE DYSPLASIA IS NOT IDENTIFIED. 2. Surgical [P], transverse ulcer BXs - HYPERPLASTIC-TYPE CHANGES WITH FOCAL SURFACE ULCERATION. - THERE IS NO EVIDENCE OF MALIGNANCY.  EGD November 2015:  1. multiple  duodenal bulb and duodenal outlet ulcerations with slow bleeding. Hemostasis achieved with multiple injections of it epinephrine into the edges of the ulcers. Total of 5 cc 2. Nonobstructing distal esophageal stricture not dilated 3. 2 cm reducible hiatal hernia 4. Biopsy from the gastric antrum to rule out H. Pylori  Stomach, biopsy, antrum - BENIGN GASTRIC ANTRAL-TYPE MUCOSA WITH ASSOCIATED MINIMAL CHRONIC INFLAMMATION. - NO EVIDENCE OF HELICOBACTER PYLORI, INTESTINAL METAPLASIA, DYSPLASIA OR MALIGNANCY.   Past Medical History:  Diagnosis Date   Allergy    CHF (congestive heart failure) (HCC)    Chronic back pain    Diabetes mellitus without complication (HCC)    DNR (do not resuscitate) 11/19/2020   Duodenal ulcer hemorrhage 08/29/2014   ED (erectile dysfunction)    Esophageal stricture 08/30/2014   Glucose intolerance (impaired glucose tolerance)    Hiatal hernia 08/30/2014   Hypertension    Hypokalemia 04/11/2013   MRSA carrier 08/30/2014   Obesity    Osteoarthritis    Pneumonia    Pulmonary embolism (HCC) 06/2020   Scoliosis 2016   Spinal stenosis of lumbar region    Urinary tract infection 04/11/2013    Past Surgical History:  Procedure Laterality Date   BACK SURGERY  08/07/2021   x 2   COLONOSCOPY  2008   ESOPHAGOGASTRODUODENOSCOPY N/A 08/29/2014   Procedure: ESOPHAGOGASTRODUODENOSCOPY (EGD);  Surgeon: Hart Carwin, MD;  Location: [facility];  Service: Endoscopy;  Laterality: N/A;   LAMINECTOMY N/A 07/28/2018   Procedure: THORACIC ELEVEN- THORACIC TWELVE POSTERIOR DECOMPRESSION LAMINECTOMY;  Surgeon: Jadene Pierini, MD;  Location: [facility] OR;  Service: Neurosurgery;  Laterality: N/A;   LUMBAR LAMINECTOMY     NASAL POLYP SURGERY     ORIF ANKLE FRACTURE Right 07/01/2020   Procedure: OPEN REDUCTION INTERNAL FIXATION (ORIF) ANKLE FRACTURE. TRIMALLEOLAR;  Surgeon: Teryl Lucy, MD;  Location: [facility] OR;  Service: Orthopedics;  Laterality: Right;   RIGHT HEART CATH  N/A 07/06/2022   Procedure: RIGHT HEART CATH;  Surgeon: Laurey Morale, MD;  Location: [facility] INVASIVE CV LAB;  Service: Cardiovascular;  Laterality: N/A;   RIGHT/LEFT HEART CATH AND CORONARY ANGIOGRAPHY N/A 03/02/2022   Procedure: RIGHT/LEFT HEART CATH AND CORONARY ANGIOGRAPHY;  Surgeon: Orpah Cobb, MD;  Location: [facility] INVASIVE CV LAB;  Service: Cardiovascular;  Laterality: N/A;   TONSILLECTOMY      Prior to Admission medications   Medication Sig Start Date End Date Taking? Authorizing Provider  acetaminophen (TYLENOL) 325 MG tablet Take 1-2 tablets (325-650 mg total) by mouth every 4 (four) hours as needed for mild pain. 09/06/21  Yes Love, Evlyn Kanner, PA-C  amLODipine (NORVASC) 10 MG tablet TAKE 1 TABLET BY MOUTH DAILY 02/04/23  Yes [place], [name], MD  Blood Glucose Monitoring Suppl (ACCU-CHEK GUIDE ME) w/Device KIT USE TO TEST BLOOD SUGARS 1-2 TIMES DAILY. 03/06/22  Yes [place], [name], MD  carvedilol (COREG) 12.5 MG tablet TAKE 1 TABLET BY MOUTH TWICE  DAILY WITH MEALS 09/11/23  Yes Laurey Morale, MD  cholecalciferol (VITAMIN D3) 25 MCG (1000 UNIT) tablet Take 1,000 Units by mouth daily.   Yes [provider]  DULoxetine (CYMBALTA) 60 MG capsule TAKE 1 CAPSULE BY MOUTH EVERY DAY 09/16/23  Yes [place], [name], MD  ELIQUIS 5 MG TABS tablet TAKE 1 TABLET BY MOUTH TWICE A DAY 04/09/23  Yes Laurey Morale, MD  ferrous sulfate 325 (65 FE) MG tablet TAKE 1 TABLET BY MOUTH EVERY DAY WITH BREAKFAST Patient taking differently: Take 325 mg by mouth daily as needed. 03/01/23  Yes [place], [name], MD  gabapentin (NEURONTIN) 800 MG tablet Take 1 tablet (800 mg total) by mouth 3 (three) times daily for neuropathy. 08/08/23  Yes   HYDROcodone-acetaminophen (NORCO) 7.5-325 MG tablet Take 1 tablet by mouth in the morning, at noon, in the evening, and at bedtime. 7.5 mg/375 mg   Yes [provider]  hydrocortisone (ANUSOL-HC) 2.5 % rectal cream Place 1 application. rectally as needed for  hemorrhoids or anal itching.   Yes [provider]  levETIRAcetam (KEPPRA) 250 MG tablet TAKE 1 TABLET BY MOUTH TWICE  DAILY 06/12/23  Yes [place], [name], MD  lidocaine (LIDODERM) 5 % Place 1 patch onto the skin daily as needed (pain). Remove & Discard patch within 12 hours or as directed by MD   Yes [provider]  macitentan (OPSUMIT) 10 MG tablet Take 10 mg by mouth daily.   Yes [provider]  metolazone (ZAROXOLYN) 2.5 MG tablet Take 1 tablet (2.5 mg total) by mouth once for 1 dose. Take 2 potassium tablets with the Metolazone 09/18/23 09/19/23 Yes Milford, Anderson Malta, FNP  naloxone [facility]) nasal spray 4 mg/0.1 mL Place 1 spray into the nose as needed. 06/13/23  Yes   omeprazole (PRILOSEC) 40 MG capsule TAKE 1 CAPSULE BY MOUTH DAILY 02/22/23  Yes [place], [name], MD  polyethylene glycol (MIRALAX / GLYCOLAX) 17 g packet Take 17 g by mouth daily as needed for mild constipation. 09/06/21  Yes Love, Evlyn Kanner, PA-C  potassium chloride SA (  KLOR-CON M) 20 MEQ tablet Take 1 tablet (20 mEq total) by mouth daily. Patient taking differently: Take 40 mEq by mouth daily. 09/18/23  Yes Milford, Jessica M, FNP  Riociguat (ADEMPAS) 2 MG TABS Take 2 mg by mouth in the morning, at noon, and at bedtime. 06/24/23  Yes Laurey Morale, MD  rOPINIRole (REQUIP) 1 MG tablet TAKE 1 TABLET BY MOUTH AT  BEDTIME 05/06/23  Yes [place], [name], MD  rosuvastatin (CRESTOR) 20 MG tablet TAKE 1 TABLET BY MOUTH DAILY 09/11/23  Yes [place], [name], MD  spironolactone (ALDACTONE) 25 MG tablet TAKE 1/2 TABLET BY MOUTH EVERY DAY Patient taking differently: Take 25 mg by mouth daily. 09/03/23  Yes Laurey Morale, MD  torsemide (DEMADEX) 20 MG tablet Take 3 tablets (60 mg total) by mouth daily. 09/18/23  Yes Milford, Anderson Malta, FNP  traZODone (DESYREL) 50 MG tablet TAKE 1/2 TO 1 TABLET BY MOUTH AT BEDTIME AS NEEDED FOR SLEEP 07/31/23  Yes Lovorn, Aundra Millet, MD  vitamin C (ASCORBIC ACID) 250 MG tablet Take 250 mg  by mouth daily.   Yes [provider]  tirzepatide Gregg[facility] Keen) 2.5 MG/0.5ML Pen Inject 2.5 mg into the skin once a week. Patient not taking: Reported on 09/18/2023 05/08/23   Laurey Morale, MD    Current Facility-Administered Medications  Medication Dose Route Frequency Provider Last Rate Last Admin   0.9 %  sodium chloride infusion (Manually program via Guardrails IV Fluids)   Intravenous Once Zannie Cove, MD       acetaminophen (TYLENOL) tablet 650 mg  650 mg Oral Q6H PRN Maretta Bees, MD       Or   acetaminophen (TYLENOL) suppository 650 mg  650 mg Rectal Q6H PRN Ghimire, Werner Lean, MD       albuterol (PROVENTIL) (2.5 MG/3ML) 0.083% nebulizer solution 2.5 mg  2.5 mg Nebulization Q2H PRN Ghimire, Werner Lean, MD       ascorbic acid (VITAMIN C) tablet 250 mg  250 mg Oral Daily Ghimire, Werner Lean, MD       carvedilol (COREG) tablet 12.5 mg  12.5 mg Oral BID WC Ghimire, Werner Lean, MD       cholecalciferol (VITAMIN D3) 25 MCG (1000 UNIT) tablet 1,000 Units  1,000 Units Oral Daily Maretta Bees, MD   1,000 Units at 09/18/23 2325   DULoxetine (CYMBALTA) DR capsule 30 mg  30 mg Oral Daily Ghimire, Werner Lean, MD       furosemide (LASIX) injection 80 mg  80 mg Intravenous BID Zannie Cove, MD       gabapentin (NEURONTIN) capsule 800 mg  800 mg Oral TID Maretta Bees, MD   800 mg at 09/18/23 2300   HYDROcodone-acetaminophen (NORCO) 7.5-325 MG per tablet 1 tablet  1 tablet Oral QID Maretta Bees, MD   1 tablet at 09/18/23 2259   levETIRAcetam (KEPPRA) tablet 250 mg  250 mg Oral BID Maretta Bees, MD   250 mg at 09/18/23 2300   macitentan (OPSUMIT) tablet 10 mg  10 mg Oral Daily Ghimire, Werner Lean, MD       naloxone [facility]) nasal spray 4 mg/0.1 mL  1 spray Nasal PRN Maretta Bees, MD       ondansetron [facility]) tablet 4 mg  4 mg Oral Q6H PRN Maretta Bees, MD       Or   ondansetron (ZOFRAN) injection 4 mg  4 mg Intravenous Q6H PRN Ghimire, Werner Lean,  MD  pantoprazole (PROTONIX) injection 40 mg  40 mg Intravenous Q12H Maretta Bees, MD   40 mg at 09/18/23 2300   polyethylene glycol (MIRALAX / GLYCOLAX) packet 17 g  17 g Oral BID Maretta Bees, MD   17 g at 09/18/23 2300   Riociguat TABS 2 mg  2 mg Oral TID Zannie Cove, MD       rOPINIRole (REQUIP) tablet 1 mg  1 mg Oral QHS Maretta Bees, MD   1 mg at 09/18/23 2300   rosuvastatin (CRESTOR) tablet 20 mg  20 mg Oral Daily Ghimire, Werner Lean, MD       senna-docusate (Senokot-S) tablet 2 tablet  2 tablet Oral QHS Maretta Bees, MD   2 tablet at 09/18/23 2300   spironolactone (ALDACTONE) tablet 25 mg  25 mg Oral Daily Maretta Bees, MD       traZODone (DESYREL) tablet 25-50 mg  25-50 mg Oral QHS PRN Maretta Bees, MD   50 mg at 09/18/23 2325    Allergies as of 09/18/2023 - Review Complete 09/18/2023  Allergen Reaction Noted   Aspirin Other (See Comments) 11/19/2020   Lisinopril Other (See Comments) 02/12/2017    Family History  Problem Relation Age of Onset   Diabetes Mother    Asthma Mother    Breast cancer Mother    Colon polyps Mother    Hypertension Father    Stroke Father    Heart failure Brother    Stroke Brother    Ovarian cancer Maternal Grandmother    Hypertension Maternal Grandfather    Heart attack Maternal Grandfather    Colon cancer Neg Hx     Social History   Socioeconomic History   Marital status: Married    Spouse name: Wynona Canes   Number of children: 3   Years of education: 4 years college   Highest education level: Bachelor's degree (e.g., BA, AB, BS)  Occupational History   Occupation: retired  Tobacco Use   Smoking status: Former    Current packs/day: 0.00    Average packs/day: 1 pack/day for 25.0 years (25.0 ttl pk-yrs)    Types: Cigarettes    Start date: 43    Quit date: 1995    Years since quitting: 29.9    Passive exposure: Never   Smokeless tobacco: Never  Vaping Use   Vaping status: Never Used   Substance and Sexual Activity   Alcohol use: Yes    Comment: holidays and special occasions   Drug use: Not Currently   Sexual activity: Yes    Birth control/protection: None  Other Topics Concern   Not on file  Social History Narrative   Married, 5 kids, 14 grand, 2 great grand, watch TV, Armed forces operational officer, traveling   Social Drivers of Corporate investment banker Strain: Low Risk  (03/16/2023)   Overall Financial Resource Strain (CARDIA)    Difficulty of Paying Living Expenses: Not hard at all  Food Insecurity: No Food Insecurity (09/18/2023)   Hunger Vital Sign    Worried About Running Out of Food in the Last Year: Never true    Ran Out of Food in the Last Year: Never true  Transportation Needs: No Transportation Needs (09/18/2023)   PRAPARE - Administrator, Civil Service (Medical): No    Lack of Transportation (Non-Medical): No  Physical Activity: Insufficiently Active (03/16/2023)   Exercise Vital Sign    Days of Exercise per Week: 3 days    Minutes  of Exercise per Session: 30 min  Stress: No Stress Concern Present (03/16/2023)   [employer] of Occupational Health - Occupational Stress Questionnaire    Feeling of Stress : Not at all  Social Connections: Moderately Integrated (03/16/2023)   Social Connection and Isolation Panel [NHANES]    Frequency of Communication with Friends and Family: More than three times a week    Frequency of Social Gatherings with Friends and Family: Twice a week    Attends Religious Services: More than 4 times per year    Active Member of [employer] or Organizations: No    Attends Banker Meetings: Not on file    Marital Status: Married  Catering manager Violence: Not At Risk (09/18/2023)   Humiliation, Afraid, Rape, and Kick questionnaire    Fear of Current or Ex-Partner: No    Emotionally Abused: No    Physically Abused: No    Sexually Abused: No    Review of Systems: ROS is O/W negative except as mentioned in  HPI.  Physical Exam: Vital signs in last 24 hours: Temp:  [97.6 F (36.4 C)-98.8 F (37.1 C)] 98.5 F (36.9 C) (12/12 0748) Pulse Rate:  [80-95] 80 (12/12 0748) Resp:  [14-23] 18 (12/12 0507) BP: (104-130)/(55-74) 106/70 (12/12 0748) SpO2:  [87 %-100 %] 95 % (12/12 0748) Weight:  [107.5 kg-109.3 kg] 107.5 kg (12/12 0511) Last BM Date : 09/18/23 General:  Alert, Well-developed, well-nourished, pleasant and cooperative in NAD Head:  Normocephalic and atraumatic. Eyes:  Sclera clear, no icterus.  Conjunctiva pale. Ears:  Normal auditory acuity. Mouth:  No deformity or lesions.   Lungs:  Clear throughout to auscultation.  No wheezes, crackles, or rhonchi.  Heart:  Regular rate and rhythm; no murmurs, clicks, rubs, or gallops. Abdomen:  Soft, non-distended.  BS present.  Non-tender. Rectal:  Heme positive.  Msk:  Symmetrical without gross deformities. Pulses:  Normal pulses noted. Extremities:  Some edema in B/L LEs. Neurologic:  Alert and oriented x 4;  grossly normal neurologically. Skin:  Intact without significant lesions or rashes. Psych:  Alert and cooperative. Normal mood and affect.  Intake/Output from previous day: 12/11 0701 - 12/12 0700 In: 757.3 [P.O.:240; I.V.:17.3; Blood:500] Out: 3050 [Urine:3050]  Lab Results: Recent Labs    09/18/23 1116 09/18/23 1507  WBC 7.3 7.3  HGB 6.6* 6.9*  HCT 23.9* 24.9*  PLT 413* 416*   BMET Recent Labs    09/18/23 1116 09/18/23 1507  NA 140 148*  K 4.3 5.9*  CL 113* 112*  CO2 22 22  GLUCOSE 122* 119*  BUN 27* 25*  CREATININE 1.37* 1.40*  CALCIUM 9.8 10.5*   LFT Recent Labs    09/18/23 1507  PROT 7.2  ALBUMIN 3.3*  AST 39  ALT 14  ALKPHOS 68  BILITOT 0.7   Studies/Results: DG Chest Port 1 View Result Date: 09/18/2023 CLINICAL DATA:  Two-week history of shortness of breath EXAM: PORTABLE CHEST 1 VIEW COMPARISON:  Chest radiograph dated 04/30/2023 FINDINGS: Normal lung volumes. No focal consolidations.  Blunting of the left costophrenic angle. Mildly increased enlargement of the cardiomediastinal silhouette. No acute osseous abnormality. IMPRESSION: 1. Increased cardiomegaly. 2. Blunting of the left costophrenic angle, which may reflect pleural effusion. Electronically Signed   By: Agustin Cree M.D.   On: 09/18/2023 16:30    IMPRESSION:  *Symptomatic anemia with hemoglobin down to 6.6 g but 7.3 grams this AM after receiving a unit of PRBCs yesterday.  Going to receive another unit of packed  red blood cells.  Two months ago was 8.5 grams and baseline looks to be round 10.5-11 grams.  Hemoccult positive.  Reports minimal intermittent BRBPR and "dark green" stools.  Is on iron at home. *Chonic anticoagulation with Eliquis due to recurrent VTE and saddle PE: Last dose of Eliquis 12/10 PM. *Chronic kidney disease stage IIIb *History of cord compression/spinal stenosis status post multiple back surgeries: Is mostly wheelchair-bound with chronic pain syndrome *History of duodenal bulb ulcers on EGD in 2015, was taking NSAIDs at that time.  No longer using NSAIDs.  Is on PPI daily at home. *Chronic RV failure/pulmonary hypertension   PLAN: -Agree with blood transfusion and monitor Hgb. -Agree with pantoprazole 40 mg IV BD for now. -? Just EGD to start vs EGD/colonoscopy.  Per Dr. Myrtie Neither.  I did put him on clear liquids for now just in case (he did have some french toast and sausage for breakfast).   Princella Pellegrini. Zehr  09/19/2023, 10:01 AM  I have taken an interval history, thoroughly reviewed the chart and examined the patient. I agree with the Advanced Practitioner's note, impression and recommendations, and have recorded additional findings, impressions and recommendations below. I performed a substantive portion of this encounter (>50% time spent), including a complete performance of the medical decision making.  My additional thoughts are as follows:  Multifactorial anemia (CKD, acute on chronic GI  blood loss).  Variable reports of his GI bleeding, but he describes it as intermittent and dark red for the last few months.  It was earlier described as being black or green.  Altogether, difficult to tell where he may have been having GI bleeding from, but it needs further investigation. His bleeding has not been brisk, he is hemodynamically stable. Also here with significant volume overload due to his heart failure. Cannot be off OAC long if possible due to his significant history of VTE/PE  I discussed an upper endoscopy with him tomorrow and he was agreeable after discussion procedure and risks.  The benefits and risks of the planned procedure were described in detail with the patient or (when appropriate) their health care proxy.  Risks were outlined as including, but not limited to, bleeding, infection, perforation, adverse medication reaction leading to cardiac or pulmonary decompensation, pancreatitis (if ERCP).  The limitation of incomplete mucosal visualization was also discussed.  No guarantees or warranties were given.  Patient at increased risk for cardiopulmonary complications of procedure due to medical comorbidities.  Consent obtained in the presence of his nurse.  Heart healthy diet the remainder today, then n.p.o. after midnight.  Continue twice daily Protonix in the interim   Charlie Pitter III [phone]

## 2023-09-19 NOTE — Consult Note (Signed)
WOC Nurse Consult Note: Reason for Consult: Consult requested for sacrum.  Pt has 2 areas of red moist Stage 2 pressure injuries; .2X.2X.1cm to sacrum and .2X.2X.1cm to right buttock Pressure Injury POA: Yes Dressing procedure/placement/frequency: Topical treatment orders provided for bedside nurses to perform as follows to protect from further injury: Foam dressing to sacrum/buttocks, change Q 3 days or PRN soiling. Please re-consult if further assistance is needed.  Thank-you,  Cammie Mcgee MSN, RN, CWOCN, [place], CNS [phone]

## 2023-09-19 NOTE — H&P (View-Only) (Signed)
 Referring Provider: ? Primary Care Physician:  [place], [name], MD Primary Gastroenterologist:  Dr. Russella Dar  Reason for Consultation:  GI bleed  HPI: Keith [name] is a 72 y.o. male with medical history significant of saddle PE/DVT requiring tPA in 2021-recurrent PE in 2023 after stopping DOAC now on Eliquis ongoing, chronic HFpEF with RV failure, DM-2, HTN, spinal stenosis requiring numerous back surgeries-mostly wheelchair-bound was evaluated by outpatient cardiology today for complaints of worsening shortness of breath-thought to be volume overloaded but found to have worsening anemia on routine labs and sent to the ED for further evaluation and treatment.  Hemoglobin down to 6.6 g, but now 7.3 grams this AM after a unit of PRBCs.  Going to receive another unit of packed red blood cells.  Two months ago was 8.5 grams and baseline looks to be round 10.5-11 grams.  Hemoccult positive.  Reports minimal intermittent BRBPR and "dark green" stools.  Is on iron at home.  Is on daily PPI at home.  No NSAID use.  No abdominal pain.  Moves his bowels regularly.  Was on Mounjaro for less than a year and says that he lost 120 pounds.  It is on hold for now though because he was having a lot of nausea from it.  That is improved now since coming off of it.  Colonoscopy April 2019:  - One 8 mm polyp in the mid transverse colon, removed with a cold snare. Resected and retrieved. - A single ( solitary) ulcer in the distal transverse colon. Biopsied. - Moderate diverticulosis in the left colon. There was no evidence of diverticular bleeding. - Internal hemorrhoids. - The examination was otherwise normal on direct and retroflexion views.  1. Surgical [P], transverse, polyp - TUBULAR ADENOMA(S). - HIGH GRADE DYSPLASIA IS NOT IDENTIFIED. 2. Surgical [P], transverse ulcer BXs - HYPERPLASTIC-TYPE CHANGES WITH FOCAL SURFACE ULCERATION. - THERE IS NO EVIDENCE OF MALIGNANCY.  EGD November 2015:  1. multiple  duodenal bulb and duodenal outlet ulcerations with slow bleeding. Hemostasis achieved with multiple injections of it epinephrine into the edges of the ulcers. Total of 5 cc 2. Nonobstructing distal esophageal stricture not dilated 3. 2 cm reducible hiatal hernia 4. Biopsy from the gastric antrum to rule out H. Pylori  Stomach, biopsy, antrum - BENIGN GASTRIC ANTRAL-TYPE MUCOSA WITH ASSOCIATED MINIMAL CHRONIC INFLAMMATION. - NO EVIDENCE OF HELICOBACTER PYLORI, INTESTINAL METAPLASIA, DYSPLASIA OR MALIGNANCY.   Past Medical History:  Diagnosis Date   Allergy    CHF (congestive heart failure) (HCC)    Chronic back pain    Diabetes mellitus without complication (HCC)    DNR (do not resuscitate) 11/19/2020   Duodenal ulcer hemorrhage 08/29/2014   ED (erectile dysfunction)    Esophageal stricture 08/30/2014   Glucose intolerance (impaired glucose tolerance)    Hiatal hernia 08/30/2014   Hypertension    Hypokalemia 04/11/2013   MRSA carrier 08/30/2014   Obesity    Osteoarthritis    Pneumonia    Pulmonary embolism (HCC) 06/2020   Scoliosis 2016   Spinal stenosis of lumbar region    Urinary tract infection 04/11/2013    Past Surgical History:  Procedure Laterality Date   BACK SURGERY  08/07/2021   x 2   COLONOSCOPY  2008   ESOPHAGOGASTRODUODENOSCOPY N/A 08/29/2014   Procedure: ESOPHAGOGASTRODUODENOSCOPY (EGD);  Surgeon: Hart Carwin, MD;  Location: [facility];  Service: Endoscopy;  Laterality: N/A;   LAMINECTOMY N/A 07/28/2018   Procedure: THORACIC ELEVEN- THORACIC TWELVE POSTERIOR DECOMPRESSION LAMINECTOMY;  Surgeon: Jadene Pierini, MD;  Location: [facility] OR;  Service: Neurosurgery;  Laterality: N/A;   LUMBAR LAMINECTOMY     NASAL POLYP SURGERY     ORIF ANKLE FRACTURE Right 07/01/2020   Procedure: OPEN REDUCTION INTERNAL FIXATION (ORIF) ANKLE FRACTURE. TRIMALLEOLAR;  Surgeon: Teryl Lucy, MD;  Location: [facility] OR;  Service: Orthopedics;  Laterality: Right;   RIGHT HEART CATH  N/A 07/06/2022   Procedure: RIGHT HEART CATH;  Surgeon: Laurey Morale, MD;  Location: [facility] INVASIVE CV LAB;  Service: Cardiovascular;  Laterality: N/A;   RIGHT/LEFT HEART CATH AND CORONARY ANGIOGRAPHY N/A 03/02/2022   Procedure: RIGHT/LEFT HEART CATH AND CORONARY ANGIOGRAPHY;  Surgeon: Orpah Cobb, MD;  Location: [facility] INVASIVE CV LAB;  Service: Cardiovascular;  Laterality: N/A;   TONSILLECTOMY      Prior to Admission medications   Medication Sig Start Date End Date Taking? Authorizing Provider  acetaminophen (TYLENOL) 325 MG tablet Take 1-2 tablets (325-650 mg total) by mouth every 4 (four) hours as needed for mild pain. 09/06/21  Yes Love, Evlyn Kanner, PA-C  amLODipine (NORVASC) 10 MG tablet TAKE 1 TABLET BY MOUTH DAILY 02/04/23  Yes [place], [name], MD  Blood Glucose Monitoring Suppl (ACCU-CHEK GUIDE ME) w/Device KIT USE TO TEST BLOOD SUGARS 1-2 TIMES DAILY. 03/06/22  Yes [place], [name], MD  carvedilol (COREG) 12.5 MG tablet TAKE 1 TABLET BY MOUTH TWICE  DAILY WITH MEALS 09/11/23  Yes Laurey Morale, MD  cholecalciferol (VITAMIN D3) 25 MCG (1000 UNIT) tablet Take 1,000 Units by mouth daily.   Yes [provider]  DULoxetine (CYMBALTA) 60 MG capsule TAKE 1 CAPSULE BY MOUTH EVERY DAY 09/16/23  Yes [place], [name], MD  ELIQUIS 5 MG TABS tablet TAKE 1 TABLET BY MOUTH TWICE A DAY 04/09/23  Yes Laurey Morale, MD  ferrous sulfate 325 (65 FE) MG tablet TAKE 1 TABLET BY MOUTH EVERY DAY WITH BREAKFAST Patient taking differently: Take 325 mg by mouth daily as needed. 03/01/23  Yes [place], [name], MD  gabapentin (NEURONTIN) 800 MG tablet Take 1 tablet (800 mg total) by mouth 3 (three) times daily for neuropathy. 08/08/23  Yes   HYDROcodone-acetaminophen (NORCO) 7.5-325 MG tablet Take 1 tablet by mouth in the morning, at noon, in the evening, and at bedtime. 7.5 mg/375 mg   Yes [provider]  hydrocortisone (ANUSOL-HC) 2.5 % rectal cream Place 1 application. rectally as needed for  hemorrhoids or anal itching.   Yes [provider]  levETIRAcetam (KEPPRA) 250 MG tablet TAKE 1 TABLET BY MOUTH TWICE  DAILY 06/12/23  Yes [place], [name], MD  lidocaine (LIDODERM) 5 % Place 1 patch onto the skin daily as needed (pain). Remove & Discard patch within 12 hours or as directed by MD   Yes [provider]  macitentan (OPSUMIT) 10 MG tablet Take 10 mg by mouth daily.   Yes [provider]  metolazone (ZAROXOLYN) 2.5 MG tablet Take 1 tablet (2.5 mg total) by mouth once for 1 dose. Take 2 potassium tablets with the Metolazone 09/18/23 09/19/23 Yes Milford, Anderson Malta, FNP  naloxone [facility]) nasal spray 4 mg/0.1 mL Place 1 spray into the nose as needed. 06/13/23  Yes   omeprazole (PRILOSEC) 40 MG capsule TAKE 1 CAPSULE BY MOUTH DAILY 02/22/23  Yes [place], [name], MD  polyethylene glycol (MIRALAX / GLYCOLAX) 17 g packet Take 17 g by mouth daily as needed for mild constipation. 09/06/21  Yes Love, Evlyn Kanner, PA-C  potassium chloride SA (  KLOR-CON M) 20 MEQ tablet Take 1 tablet (20 mEq total) by mouth daily. Patient taking differently: Take 40 mEq by mouth daily. 09/18/23  Yes Milford, Jessica M, FNP  Riociguat (ADEMPAS) 2 MG TABS Take 2 mg by mouth in the morning, at noon, and at bedtime. 06/24/23  Yes Laurey Morale, MD  rOPINIRole (REQUIP) 1 MG tablet TAKE 1 TABLET BY MOUTH AT  BEDTIME 05/06/23  Yes [place], [name], MD  rosuvastatin (CRESTOR) 20 MG tablet TAKE 1 TABLET BY MOUTH DAILY 09/11/23  Yes [place], [name], MD  spironolactone (ALDACTONE) 25 MG tablet TAKE 1/2 TABLET BY MOUTH EVERY DAY Patient taking differently: Take 25 mg by mouth daily. 09/03/23  Yes Laurey Morale, MD  torsemide (DEMADEX) 20 MG tablet Take 3 tablets (60 mg total) by mouth daily. 09/18/23  Yes Milford, Anderson Malta, FNP  traZODone (DESYREL) 50 MG tablet TAKE 1/2 TO 1 TABLET BY MOUTH AT BEDTIME AS NEEDED FOR SLEEP 07/31/23  Yes Lovorn, Aundra Millet, MD  vitamin C (ASCORBIC ACID) 250 MG tablet Take 250 mg  by mouth daily.   Yes [provider]  tirzepatide Gregg[facility] Keen) 2.5 MG/0.5ML Pen Inject 2.5 mg into the skin once a week. Patient not taking: Reported on 09/18/2023 05/08/23   Laurey Morale, MD    Current Facility-Administered Medications  Medication Dose Route Frequency Provider Last Rate Last Admin   0.9 %  sodium chloride infusion (Manually program via Guardrails IV Fluids)   Intravenous Once Zannie Cove, MD       acetaminophen (TYLENOL) tablet 650 mg  650 mg Oral Q6H PRN Maretta Bees, MD       Or   acetaminophen (TYLENOL) suppository 650 mg  650 mg Rectal Q6H PRN Ghimire, Werner Lean, MD       albuterol (PROVENTIL) (2.5 MG/3ML) 0.083% nebulizer solution 2.5 mg  2.5 mg Nebulization Q2H PRN Ghimire, Werner Lean, MD       ascorbic acid (VITAMIN C) tablet 250 mg  250 mg Oral Daily Ghimire, Werner Lean, MD       carvedilol (COREG) tablet 12.5 mg  12.5 mg Oral BID WC Ghimire, Werner Lean, MD       cholecalciferol (VITAMIN D3) 25 MCG (1000 UNIT) tablet 1,000 Units  1,000 Units Oral Daily Maretta Bees, MD   1,000 Units at 09/18/23 2325   DULoxetine (CYMBALTA) DR capsule 30 mg  30 mg Oral Daily Ghimire, Werner Lean, MD       furosemide (LASIX) injection 80 mg  80 mg Intravenous BID Zannie Cove, MD       gabapentin (NEURONTIN) capsule 800 mg  800 mg Oral TID Maretta Bees, MD   800 mg at 09/18/23 2300   HYDROcodone-acetaminophen (NORCO) 7.5-325 MG per tablet 1 tablet  1 tablet Oral QID Maretta Bees, MD   1 tablet at 09/18/23 2259   levETIRAcetam (KEPPRA) tablet 250 mg  250 mg Oral BID Maretta Bees, MD   250 mg at 09/18/23 2300   macitentan (OPSUMIT) tablet 10 mg  10 mg Oral Daily Ghimire, Werner Lean, MD       naloxone [facility]) nasal spray 4 mg/0.1 mL  1 spray Nasal PRN Maretta Bees, MD       ondansetron [facility]) tablet 4 mg  4 mg Oral Q6H PRN Maretta Bees, MD       Or   ondansetron (ZOFRAN) injection 4 mg  4 mg Intravenous Q6H PRN Ghimire, Werner Lean,  MD  pantoprazole (PROTONIX) injection 40 mg  40 mg Intravenous Q12H Maretta Bees, MD   40 mg at 09/18/23 2300   polyethylene glycol (MIRALAX / GLYCOLAX) packet 17 g  17 g Oral BID Maretta Bees, MD   17 g at 09/18/23 2300   Riociguat TABS 2 mg  2 mg Oral TID Zannie Cove, MD       rOPINIRole (REQUIP) tablet 1 mg  1 mg Oral QHS Maretta Bees, MD   1 mg at 09/18/23 2300   rosuvastatin (CRESTOR) tablet 20 mg  20 mg Oral Daily Ghimire, Werner Lean, MD       senna-docusate (Senokot-S) tablet 2 tablet  2 tablet Oral QHS Maretta Bees, MD   2 tablet at 09/18/23 2300   spironolactone (ALDACTONE) tablet 25 mg  25 mg Oral Daily Maretta Bees, MD       traZODone (DESYREL) tablet 25-50 mg  25-50 mg Oral QHS PRN Maretta Bees, MD   50 mg at 09/18/23 2325    Allergies as of 09/18/2023 - Review Complete 09/18/2023  Allergen Reaction Noted   Aspirin Other (See Comments) 11/19/2020   Lisinopril Other (See Comments) 02/12/2017    Family History  Problem Relation Age of Onset   Diabetes Mother    Asthma Mother    Breast cancer Mother    Colon polyps Mother    Hypertension Father    Stroke Father    Heart failure Brother    Stroke Brother    Ovarian cancer Maternal Grandmother    Hypertension Maternal Grandfather    Heart attack Maternal Grandfather    Colon cancer Neg Hx     Social History   Socioeconomic History   Marital status: Married    Spouse name: Wynona Canes   Number of children: 3   Years of education: 4 years college   Highest education level: Bachelor's degree (e.g., BA, AB, BS)  Occupational History   Occupation: retired  Tobacco Use   Smoking status: Former    Current packs/day: 0.00    Average packs/day: 1 pack/day for 25.0 years (25.0 ttl pk-yrs)    Types: Cigarettes    Start date: 43    Quit date: 1995    Years since quitting: 29.9    Passive exposure: Never   Smokeless tobacco: Never  Vaping Use   Vaping status: Never Used   Substance and Sexual Activity   Alcohol use: Yes    Comment: holidays and special occasions   Drug use: Not Currently   Sexual activity: Yes    Birth control/protection: None  Other Topics Concern   Not on file  Social History Narrative   Married, 5 kids, 14 grand, 2 great grand, watch TV, Armed forces operational officer, traveling   Social Drivers of Corporate investment banker Strain: Low Risk  (03/16/2023)   Overall Financial Resource Strain (CARDIA)    Difficulty of Paying Living Expenses: Not hard at all  Food Insecurity: No Food Insecurity (09/18/2023)   Hunger Vital Sign    Worried About Running Out of Food in the Last Year: Never true    Ran Out of Food in the Last Year: Never true  Transportation Needs: No Transportation Needs (09/18/2023)   PRAPARE - Administrator, Civil Service (Medical): No    Lack of Transportation (Non-Medical): No  Physical Activity: Insufficiently Active (03/16/2023)   Exercise Vital Sign    Days of Exercise per Week: 3 days    Minutes  of Exercise per Session: 30 min  Stress: No Stress Concern Present (03/16/2023)   [employer] of Occupational Health - Occupational Stress Questionnaire    Feeling of Stress : Not at all  Social Connections: Moderately Integrated (03/16/2023)   Social Connection and Isolation Panel [NHANES]    Frequency of Communication with Friends and Family: More than three times a week    Frequency of Social Gatherings with Friends and Family: Twice a week    Attends Religious Services: More than 4 times per year    Active Member of [employer] or Organizations: No    Attends Banker Meetings: Not on file    Marital Status: Married  Catering manager Violence: Not At Risk (09/18/2023)   Humiliation, Afraid, Rape, and Kick questionnaire    Fear of Current or Ex-Partner: No    Emotionally Abused: No    Physically Abused: No    Sexually Abused: No    Review of Systems: ROS is O/W negative except as mentioned in  HPI.  Physical Exam: Vital signs in last 24 hours: Temp:  [97.6 F (36.4 C)-98.8 F (37.1 C)] 98.5 F (36.9 C) (12/12 0748) Pulse Rate:  [80-95] 80 (12/12 0748) Resp:  [14-23] 18 (12/12 0507) BP: (104-130)/(55-74) 106/70 (12/12 0748) SpO2:  [87 %-100 %] 95 % (12/12 0748) Weight:  [107.5 kg-109.3 kg] 107.5 kg (12/12 0511) Last BM Date : 09/18/23 General:  Alert, Well-developed, well-nourished, pleasant and cooperative in NAD Head:  Normocephalic and atraumatic. Eyes:  Sclera clear, no icterus.  Conjunctiva pale. Ears:  Normal auditory acuity. Mouth:  No deformity or lesions.   Lungs:  Clear throughout to auscultation.  No wheezes, crackles, or rhonchi.  Heart:  Regular rate and rhythm; no murmurs, clicks, rubs, or gallops. Abdomen:  Soft, non-distended.  BS present.  Non-tender. Rectal:  Heme positive.  Msk:  Symmetrical without gross deformities. Pulses:  Normal pulses noted. Extremities:  Some edema in B/L LEs. Neurologic:  Alert and oriented x 4;  grossly normal neurologically. Skin:  Intact without significant lesions or rashes. Psych:  Alert and cooperative. Normal mood and affect.  Intake/Output from previous day: 12/11 0701 - 12/12 0700 In: 757.3 [P.O.:240; I.V.:17.3; Blood:500] Out: 3050 [Urine:3050]  Lab Results: Recent Labs    09/18/23 1116 09/18/23 1507  WBC 7.3 7.3  HGB 6.6* 6.9*  HCT 23.9* 24.9*  PLT 413* 416*   BMET Recent Labs    09/18/23 1116 09/18/23 1507  NA 140 148*  K 4.3 5.9*  CL 113* 112*  CO2 22 22  GLUCOSE 122* 119*  BUN 27* 25*  CREATININE 1.37* 1.40*  CALCIUM 9.8 10.5*   LFT Recent Labs    09/18/23 1507  PROT 7.2  ALBUMIN 3.3*  AST 39  ALT 14  ALKPHOS 68  BILITOT 0.7   Studies/Results: DG Chest Port 1 View Result Date: 09/18/2023 CLINICAL DATA:  Two-week history of shortness of breath EXAM: PORTABLE CHEST 1 VIEW COMPARISON:  Chest radiograph dated 04/30/2023 FINDINGS: Normal lung volumes. No focal consolidations.  Blunting of the left costophrenic angle. Mildly increased enlargement of the cardiomediastinal silhouette. No acute osseous abnormality. IMPRESSION: 1. Increased cardiomegaly. 2. Blunting of the left costophrenic angle, which may reflect pleural effusion. Electronically Signed   By: Agustin Cree M.D.   On: 09/18/2023 16:30    IMPRESSION:  *Symptomatic anemia with hemoglobin down to 6.6 g but 7.3 grams this AM after receiving a unit of PRBCs yesterday.  Going to receive another unit of packed  red blood cells.  Two months ago was 8.5 grams and baseline looks to be round 10.5-11 grams.  Hemoccult positive.  Reports minimal intermittent BRBPR and "dark green" stools.  Is on iron at home. *Chonic anticoagulation with Eliquis due to recurrent VTE and saddle PE: Last dose of Eliquis 12/10 PM. *Chronic kidney disease stage IIIb *History of cord compression/spinal stenosis status post multiple back surgeries: Is mostly wheelchair-bound with chronic pain syndrome *History of duodenal bulb ulcers on EGD in 2015, was taking NSAIDs at that time.  No longer using NSAIDs.  Is on PPI daily at home. *Chronic RV failure/pulmonary hypertension   PLAN: -Agree with blood transfusion and monitor Hgb. -Agree with pantoprazole 40 mg IV BD for now. -? Just EGD to start vs EGD/colonoscopy.  Per Dr. Myrtie Neither.  I did put him on clear liquids for now just in case (he did have some french toast and sausage for breakfast).   Princella Pellegrini. Zehr  09/19/2023, 10:01 AM  I have taken an interval history, thoroughly reviewed the chart and examined the patient. I agree with the Advanced Practitioner's note, impression and recommendations, and have recorded additional findings, impressions and recommendations below. I performed a substantive portion of this encounter (>50% time spent), including a complete performance of the medical decision making.  My additional thoughts are as follows:  Multifactorial anemia (CKD, acute on chronic GI  blood loss).  Variable reports of his GI bleeding, but he describes it as intermittent and dark red for the last few months.  It was earlier described as being black or green.  Altogether, difficult to tell where he may have been having GI bleeding from, but it needs further investigation. His bleeding has not been brisk, he is hemodynamically stable. Also here with significant volume overload due to his heart failure. Cannot be off OAC long if possible due to his significant history of VTE/PE  I discussed an upper endoscopy with him tomorrow and he was agreeable after discussion procedure and risks.  The benefits and risks of the planned procedure were described in detail with the patient or (when appropriate) their health care proxy.  Risks were outlined as including, but not limited to, bleeding, infection, perforation, adverse medication reaction leading to cardiac or pulmonary decompensation, pancreatitis (if ERCP).  The limitation of incomplete mucosal visualization was also discussed.  No guarantees or warranties were given.  Patient at increased risk for cardiopulmonary complications of procedure due to medical comorbidities.  Consent obtained in the presence of his nurse.  Heart healthy diet the remainder today, then n.p.o. after midnight.  Continue twice daily Protonix in the interim   Charlie Pitter III [phone]

## 2023-09-19 NOTE — Progress Notes (Signed)
PROGRESS NOTE    Keith [name]  WUJ:[id number] DOB: 10/08/1951 DOA: 09/18/2023 PCP: [place], [name], MD  72/M with history of saddle PE/DVT, cardiac arrest, requiring tPA in 2021, recurrent PE in 2023 after stopping DOAC, chronic diastolic CHF, RV failure, pulmonary hypertension, type 2 diabetes mellitus, spinal stenosis, multiple back surgeries, wheelchair-bound was sent to the ED from cardiology clinic for worsening anemia, progressive dyspnea on exertion.  Some report of recent darker stools.  In the ER he was hypoxic 87% on room air, hemoglobin 6.9, heme positive stools  Subjective: Transfused 1 unit of PRBC, denies any overt bleeding overnight, still short of breath  Assessment and Plan:  Acute GI blood loss anemia  -Hemoglobin 3-4gm lower than baseline -Transfused 1 unit PRBC yesterday, repeat transfusion today, considering his extensive cardiac history, goal will be around 8 -Check anemia panel -Eliquis on hold, gastroenterology following plan for endoscopy -Continue PPI   Acute on chronic HFpEF Acute chronic RV failure/pulmonary hypertension -Last echo 8/24 with EF 70-75%, mild LVH, grade 1 DD, preserved RV -considerably volume overloaded, likely worsened by anemia -Continue IV Lasix 80 Mg twice daily, Aldactone Continue Adempas/macitentan Strict weights/intake output/daily labs.   Acute hypoxic respiratory failure Likely secondary to HFpEF exacerbation, anemia, OSA  -As above, continue CPAP   History of recurrent VTE (saddle PE requiring tPA/vasopressor support in 2021-subsequent recurrent PE 2023 while off Eliquis) -Hold Eliquis today, hopefully can resume by tomorrow   HTN Continue amlodipine/Coreg   Hyperkalemia -Resolved  CKD stage IIIb Close to baseline Follow electrolytes closely   History of cord compression/spinal stenosis-s/p multiple back surgeries Mostly bed to wheelchair bound PT/OT eval   Chronic pain syndrome Resume usual dosing of narcotics  along with Neurontin and Cymbalta -bowel regimen with MiraLAX/senna   ?  Seizure disorder Keppra   Sacral decubitus ulcer Present on admission, per wound care   OSA CPAP   DVT prophylaxis: Code Status:  Family Communication: Disposition Plan:   Consultants:    Procedures:   Antimicrobials:    Objective: Vitals:   09/18/23 2028 09/19/23 0507 09/19/23 0511 09/19/23 0748  BP: 130/68 (!) 107/59  106/70  Pulse: 90 85  80  Resp: 18 18    Temp: 98.2 F (36.8 C) 98.8 F (37.1 C)  98.5 F (36.9 C)  TempSrc:    Oral  SpO2: 100% 96%  95%  Weight:   107.5 kg   Height:        Intake/Output Summary (Last 24 hours) at 09/19/2023 1108 Last data filed at 09/19/2023 9562 Gross per 24 hour  Intake 757.33 ml  Output 3050 ml  Net -2292.67 ml   Filed Weights   09/18/23 1451 09/19/23 0511  Weight: 109.3 kg 107.5 kg    Examination:  General exam: Obese chronically ill male laying in bed, AAO x 3 HEENT: Positive JVD CVS: S1-S2, regular rhythm Lungs: Basilar Rales Abdomen: Soft, obese, nontender, bowel sounds present Extremities: Positive edema  Skin: No rashes Psychiatry:  Mood & affect appropriate.     Data Reviewed:   CBC: Recent Labs  Lab 09/18/23 1116 09/18/23 1507 09/19/23 0917  WBC 7.3 7.3 5.3  HGB 6.6* 6.9* 7.3*  HCT 23.9* 24.9* 25.0*  MCV 83.6 84.4 80.6  PLT 413* 416* 358   Basic Metabolic Panel: Recent Labs  Lab 09/18/23 1116 09/18/23 1507 09/19/23 0917  NA 140 148* 142  K 4.3 5.9* 3.6  CL 113* 112* 108  CO2 22 22 23   GLUCOSE 122* 119* 139*  BUN 27* 25* 20  CREATININE 1.37* 1.40* 1.26*  CALCIUM 9.8 10.5* 9.8   GFR: Estimated Creatinine Clearance: 66 mL/min (A) (by C-G formula based on SCr of 1.26 mg/dL (H)). Liver Function Tests: Recent Labs  Lab 09/18/23 1507  AST 39  ALT 14  ALKPHOS 68  BILITOT 0.7  PROT 7.2  ALBUMIN 3.3*   No results for input(s): "LIPASE", "AMYLASE" in the last 168 hours. No results for input(s):  "AMMONIA" in the last 168 hours. Coagulation Profile: No results for input(s): "INR", "PROTIME" in the last 168 hours. Cardiac Enzymes: No results for input(s): "CKTOTAL", "CKMB", "CKMBINDEX", "TROPONINI" in the last 168 hours. BNP (last 3 results) No results for input(s): "PROBNP" in the last 8760 hours. HbA1C: No results for input(s): "HGBA1C" in the last 72 hours. CBG: No results for input(s): "GLUCAP" in the last 168 hours. Lipid Profile: No results for input(s): "CHOL", "HDL", "LDLCALC", "TRIG", "CHOLHDL", "LDLDIRECT" in the last 72 hours. Thyroid Function Tests: No results for input(s): "TSH", "T4TOTAL", "FREET4", "T3FREE", "THYROIDAB" in the last 72 hours. Anemia Panel: No results for input(s): "VITAMINB12", "FOLATE", "FERRITIN", "TIBC", "IRON", "RETICCTPCT" in the last 72 hours. Urine analysis:    Component Value Date/Time   COLORURINE YELLOW 03/27/2022 2016   APPEARANCEUR CLOUDY (A) 03/27/2022 2016   LABSPEC 1.020 03/27/2022 2016   PHURINE 8.0 03/27/2022 2016   GLUCOSEU NEGATIVE 03/27/2022 2016   HGBUR NEGATIVE 03/27/2022 2016   HGBUR negative 10/26/2010 0824   BILIRUBINUR NEGATIVE 03/27/2022 2016   BILIRUBINUR n 01/12/2019 1020   KETONESUR NEGATIVE 03/27/2022 2016   PROTEINUR NEGATIVE 03/27/2022 2016   UROBILINOGEN 0.2 01/12/2019 1020   UROBILINOGEN 0.2 04/10/2013 1555   NITRITE POSITIVE (A) 03/27/2022 2016   LEUKOCYTESUR NEGATIVE 03/27/2022 2016   Sepsis Labs: @LABRCNTIP (procalcitonin:4,lacticidven:4)  ) Recent Results (from the past 240 hours)  Resp panel by RT-PCR (RSV, Flu A&B, Covid) Anterior Nasal Swab     Status: None   Collection Time: 09/18/23  3:07 PM   Specimen: Anterior Nasal Swab  Result Value Ref Range Status   SARS Coronavirus 2 by RT PCR NEGATIVE NEGATIVE Final   Influenza A by PCR NEGATIVE NEGATIVE Final   Influenza B by PCR NEGATIVE NEGATIVE Final    Comment: (NOTE) The Xpert Xpress SARS-CoV-2/FLU/RSV plus assay is intended as an aid in  the diagnosis of influenza from Nasopharyngeal swab specimens and should not be used as a sole basis for treatment. Nasal washings and aspirates are unacceptable for Xpert Xpress SARS-CoV-2/FLU/RSV testing.  Fact Sheet for Patients: BloggerCourse.com  Fact Sheet for Healthcare Providers: SeriousBroker.it  This test is not yet approved or cleared by the [place] FDA and has been authorized for detection and/or diagnosis of SARS-CoV-2 by FDA under an Emergency Use Authorization (EUA). This EUA will remain in effect (meaning this test can be used) for the duration of the COVID-19 declaration under Section 564(b)(1) of the Act, 21 U.S.C. section 360bbb-3(b)(1), unless the authorization is terminated or revoked.     Resp Syncytial Virus by PCR NEGATIVE NEGATIVE Final    Comment: (NOTE) Fact Sheet for Patients: BloggerCourse.com  Fact Sheet for Healthcare Providers: SeriousBroker.it  This test is not yet approved or cleared by the [place] FDA and has been authorized for detection and/or diagnosis of SARS-CoV-2 by FDA under an Emergency Use Authorization (EUA). This EUA will remain in effect (meaning this test can be used) for the duration of the COVID-19 declaration under Section 564(b)(1) of the Act, 21 U.S.C. section 360bbb-3(b)(1), unless  the authorization is terminated or revoked.  Performed at [facility], [address], [place], [place] [address]      Radiology Studies: DG Chest Port 1 View Result Date: 09/18/2023 CLINICAL DATA:  Two-week history of shortness of breath EXAM: PORTABLE CHEST 1 VIEW COMPARISON:  Chest radiograph dated 04/30/2023 FINDINGS: Normal lung volumes. No focal consolidations. Blunting of the left costophrenic angle. Mildly increased enlargement of the cardiomediastinal silhouette. No acute osseous abnormality. IMPRESSION: 1. Increased  cardiomegaly. 2. Blunting of the left costophrenic angle, which may reflect pleural effusion. Electronically Signed   By: Agustin Cree M.D.   On: 09/18/2023 16:30     Scheduled Meds:  sodium chloride   Intravenous Once   vitamin C  250 mg Oral Daily   carvedilol  12.5 mg Oral BID WC   cholecalciferol  1,000 Units Oral Daily   DULoxetine  30 mg Oral Daily   furosemide  80 mg Intravenous BID   gabapentin  800 mg Oral TID   HYDROcodone-acetaminophen  1 tablet Oral QID   levETIRAcetam  250 mg Oral BID   macitentan  10 mg Oral Daily   pantoprazole (PROTONIX) IV  40 mg Intravenous Q12H   polyethylene glycol  17 g Oral BID   Riociguat  2 mg Oral TID   rOPINIRole  1 mg Oral QHS   rosuvastatin  20 mg Oral Daily   senna-docusate  2 tablet Oral QHS   spironolactone  25 mg Oral Daily   Continuous Infusions:   LOS: 1 day    Time spent:    Zannie Cove, MD [facility]   09/19/2023, 11:08 AM

## 2023-09-19 NOTE — Evaluation (Signed)
Physical Therapy Evaluation Patient Details Name: Keith [name] MRN: [id number] DOB: 09-13-1951 Today's Date: 09/19/2023  History of Present Illness  72 y.o. male admitted 12/11 with shortness of breath; Severe symptomatic anemia-concern for slow GI bleeding/chronic blood loss, Acute on chronic HFpEF, and Acute hypoxic respiratory failure.   Medical history significant of saddle PE/DVT requiring tPA in 2021-recurrent PE in 2023 after stopping DOAC, chronic HFpEF with RV failure, DM-2, HTN, spinal stenosis requiring numerous back surgeries-mostly wheelchair-bound.   Clinical Impression  Pt admitted with above diagnosis. Patient reports that PTA he was able to stand and transfer to his w/c without assistance. Could perform bath/dress on his own but would occasionally need assist to get into/out of the shower with wife's help. Presently, pt feels profoundly weak. He requires max assist to stand from highly elevated surface, only tolerating <10 seconds with heavily flexed trunk and dependence on RW for max support. He reports going to CIR in the past and is interested. I am hopeful that pt's strength with rebound after medical work-up and treatments. Will very likely need post-acute rehab to regain independence with transfers again. Consider CIR. Will progress as tolerated during admission. Recommended air mattress due to stage II pressure sores. Consider Stedy for sit to stand training and transfers. Patient will benefit from intensive inpatient follow up therapy, >3 hours/day.  Pt currently with functional limitations due to the deficits listed below (see PT Problem List). Pt will benefit from acute skilled PT to increase their independence and safety with mobility.           If plan is discharge home, recommend the following: Two people to help with walking and/or transfers;Assistance with cooking/housework;A lot of help with bathing/dressing/bathroom;Assist for transportation;Help with stairs or  ramp for entrance   Can travel by private vehicle        Equipment Recommendations None recommended by PT  Recommendations for Other Services  Rehab consult    Functional Status Assessment Patient has had a recent decline in their functional status and demonstrates the ability to make significant improvements in function in a reasonable and predictable amount of time.     Precautions / Restrictions Precautions Precautions: Fall Restrictions Weight Bearing Restrictions Per Provider Order: No      Mobility  Bed Mobility Overal bed mobility: Needs Assistance Bed Mobility: Supine to Sit, Sit to Supine     Supine to sit: HOB elevated, Used rails, Min assist Sit to supine: Min assist   General bed mobility comments: Min A to rise to EOB, pull through therapist hand as needed. Pt uses sheet to mobilize feet with UEs. Min assist to lift LEs back into bed due to weakness.    Transfers Overall transfer level: Needs assistance Equipment used: Rolling walker (2 wheels) Transfers: Sit to/from Stand Sit to Stand: Max assist, From elevated surface           General transfer comment: Bed elevated, Max assist for boost to stand, tolerated approx 10 seconds while WOC nurse inspected skin around buttocks. Heavy forward lean and reliance on RW for support.    Ambulation/Gait                  Stairs            Wheelchair Mobility     Tilt Bed    Modified Rankin (Stroke Patients Only)       Balance Overall balance assessment: Needs assistance Sitting-balance support: Feet supported, Single extremity supported Sitting balance-Leahy Scale: Poor  Standing balance support: Bilateral upper extremity supported, Reliant on assistive device for balance Standing balance-Leahy Scale: Zero                               Pertinent Vitals/Pain Pain Assessment Pain Assessment: 0-10 Pain Score: 2  Pain Location: back Pain Descriptors / Indicators:  Aching Pain Intervention(s): Monitored during session, Repositioned    Home Living Family/patient expects to be discharged to:: Private residence Living Arrangements: Spouse/significant other Available Help at Discharge: Family;Available 24 hours/day Type of Home: House Home Access: Ramped entrance     Alternate Level Stairs-Number of Steps: 11 Home Layout: Two level;1/2 bath on main level;Able to live on main level with bedroom/bathroom Home Equipment: Rolling Walker (2 wheels);Rollator (4 wheels);BSC/3in1;Shower seat;Wheelchair - manual;Hospital bed      Prior Function Prior Level of Function : Needs assist             Mobility Comments: 4WW to stand from elevated surfaces, pivots to W/c. Can move around house in w/c. Working out with Systems analyst at home. ADLs Comments: Ind with bathing/dressing bird bath mostly. Occasionally gets in shower with wife help. Wife does the cooking.     Extremity/Trunk Assessment   Upper Extremity Assessment Upper Extremity Assessment: Defer to OT evaluation    Lower Extremity Assessment Lower Extremity Assessment: Generalized weakness (Unable to lift LEs against gravity.)       Communication   Communication Communication: No apparent difficulties  Cognition Arousal: Alert Behavior During Therapy: WFL for tasks assessed/performed Overall Cognitive Status: Within Functional Limits for tasks assessed                                          General Comments General comments (skin integrity, edema, etc.): on 3L supplemental O2.    Exercises Other Exercises Other Exercises: Educated on importance of self turning for pressure relief. Verb understanding.   Assessment/Plan    PT Assessment Patient needs continued PT services  PT Problem List Decreased strength;Decreased range of motion;Decreased activity tolerance;Decreased balance;Decreased mobility;Decreased knowledge of use of DME;Cardiopulmonary status  limiting activity;Impaired tone;Pain;Decreased skin integrity       PT Treatment Interventions DME instruction;Functional mobility training;Therapeutic activities;Therapeutic exercise;Balance training;Neuromuscular re-education;Patient/family education;Wheelchair mobility training    PT Goals (Current goals can be found in the Care Plan section)  Acute Rehab PT Goals Patient Stated Goal: Get his strength back PT Goal Formulation: With patient Time For Goal Achievement: 10/03/23 Potential to Achieve Goals: Good    Frequency Min 1X/week     Co-evaluation               AM-PAC PT "6 Clicks" Mobility  Outcome Measure Help needed turning from your back to your side while in a flat bed without using bedrails?: A Little Help needed moving from lying on your back to sitting on the side of a flat bed without using bedrails?: A Little Help needed moving to and from a bed to a chair (including a wheelchair)?: Total Help needed standing up from a chair using your arms (e.g., wheelchair or bedside chair)?: Total Help needed to walk in hospital room?: Total Help needed climbing 3-5 steps with a railing? : Total 6 Click Score: 10    End of Session Equipment Utilized During Treatment: Gait belt;Oxygen Activity Tolerance: Patient tolerated treatment well (Weakness) Patient left:  in bed;with call bell/phone within reach;with bed alarm set Nurse Communication: Mobility status;Need for lift equipment (Air mattress recommended. Try Stedy for transfers) PT Visit Diagnosis: Unsteadiness on feet (R26.81);Muscle weakness (generalized) (M62.81);Difficulty in walking, not elsewhere classified (R26.2);Pain Pain - part of body:  (back; chronic)    Time: 2952-8413 PT Time Calculation (min) (ACUTE ONLY): 26 min   Charges:   PT Evaluation $PT Eval Moderate Complexity: 1 Mod PT Treatments $Therapeutic Activity: 8-22 mins PT General Charges $$ ACUTE PT VISIT: 1 Visit         Kathlyn Sacramento,  PT, DPT [facility]  Rehabilitation Services Physical Therapist Office: [phone] Website: [email or web address]   Berton Mount 09/19/2023, 10:37 AM

## 2023-09-20 ENCOUNTER — Inpatient Hospital Stay (HOSPITAL_COMMUNITY): Payer: Medicare Other | Admitting: Anesthesiology

## 2023-09-20 ENCOUNTER — Encounter (HOSPITAL_COMMUNITY): Payer: Self-pay | Admitting: Internal Medicine

## 2023-09-20 ENCOUNTER — Encounter (HOSPITAL_COMMUNITY)
Admission: EM | Disposition: A | Discharge status: Home / Self Care | Payer: Self-pay | Source: Home / Self Care | Attending: Internal Medicine

## 2023-09-20 DIAGNOSIS — D500 Iron deficiency anemia secondary to blood loss (chronic): Secondary | ICD-10-CM

## 2023-09-20 DIAGNOSIS — K3189 Other diseases of stomach and duodenum: Secondary | ICD-10-CM

## 2023-09-20 DIAGNOSIS — D62 Acute posthemorrhagic anemia: Secondary | ICD-10-CM

## 2023-09-20 DIAGNOSIS — K319 Disease of stomach and duodenum, unspecified: Secondary | ICD-10-CM | POA: Diagnosis not present

## 2023-09-20 DIAGNOSIS — R195 Other fecal abnormalities: Secondary | ICD-10-CM

## 2023-09-20 HISTORY — PX: BIOPSY: SHX5522

## 2023-09-20 HISTORY — PX: ESOPHAGOGASTRODUODENOSCOPY (EGD) WITH PROPOFOL: SHX5813

## 2023-09-20 LAB — TYPE AND SCREEN
ABO/RH(D): O POS
Antibody Screen: NEGATIVE
Unit division: 0
Unit division: 0

## 2023-09-20 LAB — CBC
HCT: 26.8 % — ABNORMAL LOW (ref 39.0–52.0)
Hemoglobin: 8.1 g/dL — ABNORMAL LOW (ref 13.0–17.0)
MCH: 24.1 pg — ABNORMAL LOW (ref 26.0–34.0)
MCHC: 30.2 g/dL (ref 30.0–36.0)
MCV: 79.8 fL — ABNORMAL LOW (ref 80.0–100.0)
Platelets: 379 10*3/uL (ref 150–400)
RBC: 3.36 MIL/uL — ABNORMAL LOW (ref 4.22–5.81)
RDW: 17 % — ABNORMAL HIGH (ref 11.5–15.5)
WBC: 5.6 10*3/uL (ref 4.0–10.5)
nRBC: 0 % (ref 0.0–0.2)

## 2023-09-20 LAB — COMPREHENSIVE METABOLIC PANEL
ALT: 14 U/L (ref 0–44)
AST: 17 U/L (ref 15–41)
Albumin: 3 g/dL — ABNORMAL LOW (ref 3.5–5.0)
Alkaline Phosphatase: 57 U/L (ref 38–126)
Anion gap: 10 (ref 5–15)
BUN: 19 mg/dL (ref 8–23)
CO2: 26 mmol/L (ref 22–32)
Calcium: 9.8 mg/dL (ref 8.9–10.3)
Chloride: 106 mmol/L (ref 98–111)
Creatinine, Ser: 1.24 mg/dL (ref 0.61–1.24)
GFR, Estimated: >60 mL/min (ref >60)
Glucose, Bld: 97 mg/dL (ref 70–99)
Potassium: 3.5 mmol/L (ref 3.5–5.1)
Sodium: 142 mmol/L (ref 135–145)
Total Bilirubin: 0.8 mg/dL (ref <1.2)
Total Protein: 6.6 g/dL (ref 6.5–8.1)

## 2023-09-20 LAB — BPAM RBC
Blood Product Expiration Date: 202412132359
Blood Product Expiration Date: 202412262359
ISSUE DATE / TIME: 202412111624
ISSUE DATE / TIME: 202412121308
Unit Type and Rh: 5100
Unit Type and Rh: 9500

## 2023-09-20 SURGERY — ESOPHAGOGASTRODUODENOSCOPY (EGD) WITH PROPOFOL
Anesthesia: Monitor Anesthesia Care

## 2023-09-20 MED ORDER — SODIUM CHLORIDE 0.9 % IV SOLN: INTRAVENOUS | Status: DC | PRN

## 2023-09-20 MED ORDER — LIDOCAINE 2% (20 MG/ML) 5 ML SYRINGE
INTRAMUSCULAR | Status: DC | PRN
  Administered 2023-09-20: 30 mg via INTRAVENOUS

## 2023-09-20 MED ORDER — SODIUM CHLORIDE 0.9 % IV SOLN
INTRAVENOUS | Status: AC | PRN
  Administered 2023-09-20: 250 mL via INTRAVENOUS

## 2023-09-20 MED ORDER — PEG 3350-KCL-NA BICARB-NACL 420 G PO SOLR
2000.0000 mL | Freq: Once | ORAL | Status: AC
  Administered 2023-09-20: 2000 mL via ORAL

## 2023-09-20 MED ORDER — BISACODYL 5 MG PO TBEC
20.0000 mg | DELAYED_RELEASE_TABLET | Freq: Once | ORAL | Status: AC
  Administered 2023-09-20: 20 mg via ORAL
  Filled 2023-09-20: qty 4

## 2023-09-20 MED ORDER — PROPOFOL 10 MG/ML IV BOLUS
INTRAVENOUS | Status: DC | PRN
  Administered 2023-09-20: 30 mg via INTRAVENOUS

## 2023-09-20 MED ORDER — PEG 3350-KCL-NA BICARB-NACL 420 G PO SOLR
2000.0000 mL | Freq: Once | ORAL | Status: AC
  Administered 2023-09-20: 2000 mL via ORAL
  Filled 2023-09-20: qty 4000

## 2023-09-20 MED ORDER — PROPOFOL 500 MG/50ML IV EMUL
INTRAVENOUS | Status: DC | PRN
  Administered 2023-09-20: 100 ug/kg/min via INTRAVENOUS

## 2023-09-20 MED ORDER — PEG 3350-KCL-NA BICARB-NACL 420 G PO SOLR: 4000.0000 mL | Freq: Once | ORAL | Status: DC

## 2023-09-20 SURGICAL SUPPLY — 14 items

## 2023-09-20 NOTE — Progress Notes (Signed)
PT Cancellation Note  Patient Details Name: Keith [name] MRN: [id number] DOB: May 05, 1951   Cancelled Treatment:    Reason Eval/Treat Not Completed: Patient at procedure or test/unavailable  Currently off unit for EGD. Will check back this afternoon as schedule permits.  Kathlyn Sacramento, PT, DPT [facility]  Rehabilitation Services Physical Therapist Office: [phone] Website: [email or web address]   Berton Mount 09/20/2023, 10:54 AM

## 2023-09-20 NOTE — Anesthesia Preprocedure Evaluation (Addendum)
Anesthesia Evaluation  Patient identified by MRN, date of birth, ID band Patient awake    Reviewed: Allergy & Precautions, NPO status , Patient's Chart, lab work & pertinent test results, reviewed documented beta blocker date and time   Airway Mallampati: II  TM Distance: >3 FB Neck ROM: Full    Dental no notable dental hx. (+) Teeth Intact, Dental Advisory Given   Pulmonary former smoker, PE   Pulmonary exam normal breath sounds clear to auscultation       Cardiovascular hypertension, Pt. on medications and Pt. on home beta blockers +CHF  Normal cardiovascular exam Rhythm:Regular Rate:Normal  TTE 2024  1. Left ventricular ejection fraction, by estimation, is 70 to 75%. The  left ventricle has hyperdynamic function. The left ventricle has no  regional wall motion abnormalities. There is mild concentric left  ventricular hypertrophy. Left ventricular  diastolic parameters are consistent with Grade I diastolic dysfunction  (impaired relaxation).   2. Right ventricular systolic function is normal. The right ventricular  size is normal.   3. The mitral valve is normal in structure. No evidence of mitral valve  regurgitation. No evidence of mitral stenosis.   4. The aortic valve is normal in structure. Aortic valve regurgitation is  not visualized. No aortic stenosis is present.     Neuro/Psych Seizures - (on keppra),   negative psych ROS   GI/Hepatic Neg liver ROS, hiatal hernia, PUD,GERD  ,,  Endo/Other  diabetes    Renal/GU Renal InsufficiencyRenal diseaseLab Results      Component                Value               Date                      NA                       142                 09/20/2023                CL                       106                 09/20/2023                K                        3.5                 09/20/2023                CO2                      26                  09/20/2023                 BUN                      19                  09/20/2023                CREATININE  1.24                09/20/2023                GFRNONAA                 >60                 09/20/2023                CALCIUM                  9.8                 09/20/2023                PHOS                     3.0                 03/05/2022                ALBUMIN                  3.0 (L)             09/20/2023                GLUCOSE                  97                  09/20/2023             negative genitourinary   Musculoskeletal  (+) Arthritis ,    Abdominal   Peds  Hematology  (+) Blood dyscrasia (eliquis), anemia Lab Results      Component                Value               Date                      WBC                      5.6                 09/20/2023                HGB                      8.1 (L)             09/20/2023                HCT                      26.8 (L)            09/20/2023                MCV                      79.8 (L)            09/20/2023                PLT                      379  09/20/2023              Anesthesia Other Findings 72/M with history of saddle PE/DVT, cardiac arrest, requiring tPA in 2021, recurrent PE in 2023 after stopping DOAC, chronic diastolic CHF, RV failure, pulmonary hypertension, type 2 diabetes mellitus, spinal stenosis, multiple back surgeries, wheelchair-bound was sent to the ED from cardiology clinic for worsening anemia, progressive dyspnea on exertion  Reproductive/Obstetrics                             Anesthesia Physical Anesthesia Plan  ASA: 3  Anesthesia Plan: MAC   Post-op Pain Management:    Induction: Intravenous  PONV Risk Score and Plan: Propofol infusion and Treatment may vary due to age or medical condition  Airway Management Planned: Natural Airway  Additional Equipment:   Intra-op Plan:   Post-operative Plan:   Informed Consent: I have reviewed the patients  History and Physical, chart, labs and discussed the procedure including the risks, benefits and alternatives for the proposed anesthesia with the patient or authorized representative who has indicated his/her understanding and acceptance.     Dental advisory given  Plan Discussed with: CRNA  Anesthesia Plan Comments:        Anesthesia Quick Evaluation

## 2023-09-20 NOTE — Progress Notes (Signed)
  Inpatient Rehabilitation Admissions Coordinator   Noted updated therapy notes today and clarification of PLOF. SNF is recommended. We are not pursuing CIR .  Ottie Glazier, RN, MSN Rehab Admissions Coordinator [phone] 09/20/2023 4:17 PM

## 2023-09-20 NOTE — Interval H&P Note (Signed)
History and Physical Interval Note:  09/20/2023 10:30 AM  Keith [name]  has presented today for surgery, with the diagnosis of Anemia.  The various methods of treatment have been discussed with the patient and family. After consideration of risks, benefits and other options for treatment, the patient has consented to  Procedure(s): ESOPHAGOGASTRODUODENOSCOPY (EGD) WITH PROPOFOL (N/A) as a surgical intervention.  The patient's history has been reviewed, patient examined, no change in status, stable for surgery.  I have reviewed the patient's chart and labs.  Questions were answered to the patient's satisfaction.     Charlie Pitter III

## 2023-09-20 NOTE — Plan of Care (Signed)
  Problem: Education: Goal: Knowledge of General Education information will improve Description: Including pain rating scale, medication(s)/side effects and non-pharmacologic comfort measures Outcome: Progressing   Problem: Health Behavior/Discharge Planning: Goal: Ability to manage health-related needs will improve Outcome: Progressing   Problem: Health Behavior/Discharge Planning: Goal: Ability to manage health-related needs will improve Outcome: Progressing   Problem: Clinical Measurements: Goal: Ability to maintain clinical measurements within normal limits will improve Outcome: Progressing Goal: Will remain free from infection Outcome: Progressing Goal: Diagnostic test results will improve Outcome: Progressing Goal: Respiratory complications will improve Outcome: Progressing Goal: Cardiovascular complication will be avoided Outcome: Progressing   

## 2023-09-20 NOTE — Transfer of Care (Signed)
Immediate Anesthesia Transfer of Care Note  Patient: Keith [name]  Procedure(s) Performed: ESOPHAGOGASTRODUODENOSCOPY (EGD) WITH PROPOFOL BIOPSY  Patient Location: Endoscopy Unit  Anesthesia Type:MAC  Level of Consciousness: awake, alert , and oriented  Airway & Oxygen Therapy: Patient Spontanous Breathing  Post-op Assessment: Report given to RN and Post -op Vital signs reviewed and stable  Post vital signs: Reviewed and stable  Last Vitals:  Vitals Value Taken Time  BP 106/65 09/20/23 1106  Temp    Pulse 82 09/20/23 1108  Resp 15 09/20/23 1108  SpO2 90 % 09/20/23 1108  Vitals shown include unfiled device data.  Last Pain:  Vitals:   09/20/23 0918  TempSrc: Temporal  PainSc: 0-No pain         Complications: No notable events documented.

## 2023-09-20 NOTE — Progress Notes (Signed)
Physical Therapy Treatment Patient Details Name: Keith [name] MRN: [id number] DOB: October 25, 1950 Today's Date: 09/20/2023   History of Present Illness 72 y.o. male admitted 12/11 with shortness of breath; Severe symptomatic anemia-concern for slow GI bleeding/chronic blood loss, Acute on chronic HFpEF, and Acute hypoxic respiratory failure.  S/p EGD 12/13. Medical history significant of saddle PE/DVT requiring tPA in 2021-recurrent PE in 2023 after stopping DOAC, chronic HFpEF with RV failure, DM-2, HTN, spinal stenosis requiring numerous back surgeries-mostly wheelchair-bound    PT Comments  Eager to participate today. Put forth great effort while utilizing Stedy for transfer training and functional strengthening. Max assist to rise from EOB, and Mod assist to rise from perched position on [place] several times. Reviewed LE exercises and unloading techniques for pressure relief. Upon further discussion pt reports inability to stand for 6 months so he may actually be closer to baseline this admission. Would likely do well at regaining some independence at SNF. As mentioned by acute rehab AC pt may not be able to tolerate the higher level intensity expected in inpatient rehab.    If plan is discharge home, recommend the following: Two people to help with walking and/or transfers;Assistance with cooking/housework;A lot of help with bathing/dressing/bathroom;Assist for transportation;Help with stairs or ramp for entrance   Can travel by private vehicle     No  Equipment Recommendations  None recommended by PT    Recommendations for Other Services       Precautions / Restrictions Precautions Precautions: Fall Restrictions Weight Bearing Restrictions Per Provider Order: No     Mobility  Bed Mobility Overal bed mobility: Needs Assistance Bed Mobility: Supine to Sit, Sit to Supine     Supine to sit: HOB elevated, Used rails, Min assist Sit to supine: Min assist   General bed mobility  comments: Min assist to raise trunk to EOB, pt able to use sheet to move LEs. LE support to return to supine.    Transfers Overall transfer level: Needs assistance Equipment used: Ambulation equipment used Transfers: Sit to/from Stand Sit to Stand: Max assist, From elevated surface, Via lift equipment           General transfer comment: Antony Salmon utilized to facilitate advantage for more leverage and independence with sit to stand transfer training and WB through LEs. Pt required Max assist to rise from bed with Stedy. Mod assist to rise 3x from Stedy paddles. Tolerated ea stand <10 seconds. tactile cues to facilitate upright posture and to extend hips forward. Heavy use of UEs to press up. Transfer via Lift Equipment: Stedy  Ambulation/Gait               General Gait Details: Theme park manager Bed    Modified Rankin (Stroke Patients Only)       Balance Overall balance assessment: Needs assistance Sitting-balance support: Feet supported, No upper extremity supported Sitting balance-Leahy Scale: Fair     Standing balance support: Bilateral upper extremity supported, Reliant on assistive device for balance Standing balance-Leahy Scale: Zero                              Cognition Arousal: Alert Behavior During Therapy: WFL for tasks assessed/performed Overall Cognitive Status: Within Functional Limits for tasks assessed  Exercises General Exercises - Lower Extremity Ankle Circles/Pumps: Strengthening, Both, 10 reps, Supine Quad Sets: Strengthening, Both, 10 reps, Supine Gluteal Sets: Strengthening, Both, 10 reps, Supine    General Comments General comments (skin integrity, edema, etc.): Reviewed need for regular unloading via rolling in bed.      Pertinent Vitals/Pain Pain Assessment Pain Assessment: No/denies pain    Home Living                           Prior Function            PT Goals (current goals can now be found in the care plan section) Acute Rehab PT Goals Patient Stated Goal: Get his strength back PT Goal Formulation: With patient Time For Goal Achievement: 10/03/23 Potential to Achieve Goals: Good Progress towards PT goals: Progressing toward goals    Frequency    Min 1X/week      PT Plan      Co-evaluation              AM-PAC PT "6 Clicks" Mobility   Outcome Measure  Help needed turning from your back to your side while in a flat bed without using bedrails?: A Little Help needed moving from lying on your back to sitting on the side of a flat bed without using bedrails?: A Little Help needed moving to and from a bed to a chair (including a wheelchair)?: Total Help needed standing up from a chair using your arms (e.g., wheelchair or bedside chair)?: Total Help needed to walk in hospital room?: Total Help needed climbing 3-5 steps with a railing? : Total 6 Click Score: 10    End of Session Equipment Utilized During Treatment: Gait belt;Oxygen Activity Tolerance: Patient tolerated treatment well (Weakness) Patient left: in bed;with call bell/phone within reach;with bed alarm set Nurse Communication: Mobility status;Need for lift equipment [facility] for transfers to [facility] +2) PT Visit Diagnosis: Unsteadiness on feet (R26.81);Muscle weakness (generalized) (M62.81);Difficulty in walking, not elsewhere classified (R26.2);Pain Pain - part of body:  (back; chronic)     Time: 0981-1914 PT Time Calculation (min) (ACUTE ONLY): 29 min  Charges:    $Therapeutic Exercise: 8-22 mins $Therapeutic Activity: 8-22 mins PT General Charges $$ ACUTE PT VISIT: 1 Visit                     Kathlyn Sacramento, PT, DPT [facility]  Rehabilitation Services Physical Therapist Office: [phone] Website: [email or web address]    Berton Mount 09/20/2023, 2:57 PM

## 2023-09-20 NOTE — Progress Notes (Signed)
PROGRESS NOTE    Keith [name]  ZOX:[id number] DOB: [birth year] DOA: 09/18/2023 PCP: [place], [name], MD    Brief history: Patient is a 72 year old male with past medical history significant for saddle PE/DVT, cardiac arrest, requiring tPA in 2021, recurrent PE in 2023 after stopping DOAC, chronic diastolic CHF, RV failure, pulmonary hypertension, type 2 diabetes mellitus, spinal stenosis, and multiple back surgeries.  Patient is wheelchair-bound.  Patient was sent to the ED from cardiology clinic with worsening anemia, progressive dyspnea on exertion.  Patient has been on naproxen.  Some report of recent darker stools.  In the ER he was hypoxic 87% on room air, hemoglobin 6.9, heme positive stools.  Patient was transfused 1 unit of packed red blood cells.  Pertinent labs: Iron of 19, TIBC of 350, TSAT of 5 and ferritin of 20.  CBC done today revealed hemoglobin of 8.1 g/dL and hematocrit of 81.1.  09/20/2023: Patient underwent EGD that revealed normal esophagus and erythematous stomach.  GI plans to proceed with colonoscopy tomorrow.  Subjective: No new complaints today.    Assessment and Plan:  Acute GI blood loss anemia  -Hemoglobin 3-4gm lower than baseline -Transfused 1 unit PRBC yesterday. -Repeat hemoglobin of 8.1 g/dL. -EGD revealed erythematous stomach.  For colonoscopy tomorrow. -Significant history of NSAID use. -GI input is appreciated. -Eliquis is on hold. -Continue PPI.   Acute on chronic HFpEF Acute chronic RV failure/pulmonary hypertension -Last echo 8/24 with EF 70-75%, mild LVH, grade 1 DD, preserved RV  -considerably volume overloaded, likely worsened by anemia, but improving.  Low albumin is also noted. -Continue IV Lasix 80 Mg twice daily, Aldactone -Continue Adempas/macitentan Strict weights/intake output/daily labs.   Acute hypoxic respiratory failure -Likely multifactorial (symptomatic anemia, HFpEF exacerbation, OSA) -Continue CPAP.    History of  recurrent VTE (saddle PE requiring tPA/vasopressor support in 2021-subsequent recurrent PE 2023 while off Eliquis) -Eliquis is on hold.   -GI to advise when to restart. -EGD done today revealed erythematous stomach. -For colonoscopy tomorrow.  HTN Continue amlodipine/Coreg   Hyperkalemia -Resolved  CKD stage IIIb Close to baseline Follow electrolytes closely   History of cord compression/spinal stenosis-s/p multiple back surgeries Mostly bed to wheelchair bound PT/OT eval   Chronic pain syndrome Resume usual dosing of narcotics along with Neurontin and Cymbalta -bowel regimen with MiraLAX/senna   ?  Seizure disorder Keppra   Sacral decubitus ulcer Present on admission, per wound care   OSA CPAP   DVT prophylaxis: Code Status:  Family Communication: Disposition Plan:   Consultants:    Procedures:   Antimicrobials:    Objective: Vitals:   09/20/23 1120 09/20/23 1130 09/20/23 1208 09/20/23 1211  BP: 107/63 108/70 109/69 109/69  Pulse: 79 83 83 83  Resp: 14 13 15    Temp:   98.1 F (36.7 C)   TempSrc:   Oral   SpO2: 91% 93% 98%   Weight:      Height:        Intake/Output Summary (Last 24 hours) at 09/20/2023 1212 Last data filed at 09/20/2023 1100 Gross per 24 hour  Intake 400 ml  Output 4170 ml  Net -3770 ml   Filed Weights   09/18/23 1451 09/19/23 0511 09/20/23 0445  Weight: 109.3 kg 107.5 kg 106.5 kg    Examination:  General exam: Obese.  Not in any distress.  Awake and alert.  Patient has deformed left foot (from parachute injury) HEENT: Patient is pale. Neck: Supple. CVS: S1-S2, regular rhythm Lungs: Basilar Rales  Abdomen: Soft and nontender. Extremities: Edema.     Data Reviewed:   CBC: Recent Labs  Lab 09/18/23 1116 09/18/23 1507 09/19/23 0917 09/20/23 0743  WBC 7.3 7.3 5.3 5.6  HGB 6.6* 6.9* 7.3* 8.1*  HCT 23.9* 24.9* 25.0* 26.8*  MCV 83.6 84.4 80.6 79.8*  PLT 413* 416* 358 379   Basic Metabolic Panel: Recent Labs   Lab 09/18/23 1116 09/18/23 1507 09/19/23 0917 09/20/23 0743  NA 140 148* 142 142  K 4.3 5.9* 3.6 3.5  CL 113* 112* 108 106  CO2 22 22 23 26   GLUCOSE 122* 119* 139* 97  BUN 27* 25* 20 19  CREATININE 1.37* 1.40* 1.26* 1.24  CALCIUM 9.8 10.5* 9.8 9.8   GFR: Estimated Creatinine Clearance: 66.8 mL/min (by C-G formula based on SCr of 1.24 mg/dL). Liver Function Tests: Recent Labs  Lab 09/18/23 1507 09/20/23 0743  AST 39 17  ALT 14 14  ALKPHOS 68 57  BILITOT 0.7 0.8  PROT 7.2 6.6  ALBUMIN 3.3* 3.0*   No results for input(s): "LIPASE", "AMYLASE" in the last 168 hours. No results for input(s): "AMMONIA" in the last 168 hours. Coagulation Profile: No results for input(s): "INR", "PROTIME" in the last 168 hours. Cardiac Enzymes: No results for input(s): "CKTOTAL", "CKMB", "CKMBINDEX", "TROPONINI" in the last 168 hours. BNP (last 3 results) No results for input(s): "PROBNP" in the last 8760 hours. HbA1C: No results for input(s): "HGBA1C" in the last 72 hours. CBG: No results for input(s): "GLUCAP" in the last 168 hours. Lipid Profile: No results for input(s): "CHOL", "HDL", "LDLCALC", "TRIG", "CHOLHDL", "LDLDIRECT" in the last 72 hours. Thyroid Function Tests: No results for input(s): "TSH", "T4TOTAL", "FREET4", "T3FREE", "THYROIDAB" in the last 72 hours. Anemia Panel: Recent Labs    09/19/23 1328  VITAMINB12 318  FOLATE 18.4  FERRITIN 20*  TIBC 350  IRON 19*  RETICCTPCT 2.1   Urine analysis:    Component Value Date/Time   COLORURINE YELLOW 03/27/2022 2016   APPEARANCEUR CLOUDY (A) 03/27/2022 2016   LABSPEC 1.020 03/27/2022 2016   PHURINE 8.0 03/27/2022 2016   GLUCOSEU NEGATIVE 03/27/2022 2016   HGBUR NEGATIVE 03/27/2022 2016   HGBUR negative 10/26/2010 0824   BILIRUBINUR NEGATIVE 03/27/2022 2016   BILIRUBINUR n 01/12/2019 1020   KETONESUR NEGATIVE 03/27/2022 2016   PROTEINUR NEGATIVE 03/27/2022 2016   UROBILINOGEN 0.2 01/12/2019 1020   UROBILINOGEN 0.2  04/10/2013 1555   NITRITE POSITIVE (A) 03/27/2022 2016   LEUKOCYTESUR NEGATIVE 03/27/2022 2016   Sepsis Labs: @LABRCNTIP (procalcitonin:4,lacticidven:4)  ) Recent Results (from the past 240 hours)  Resp panel by RT-PCR (RSV, Flu A&B, Covid) Anterior Nasal Swab     Status: None   Collection Time: 09/18/23  3:07 PM   Specimen: Anterior Nasal Swab  Result Value Ref Range Status   SARS Coronavirus 2 by RT PCR NEGATIVE NEGATIVE Final   Influenza A by PCR NEGATIVE NEGATIVE Final   Influenza B by PCR NEGATIVE NEGATIVE Final    Comment: (NOTE) The Xpert Xpress SARS-CoV-2/FLU/RSV plus assay is intended as an aid in the diagnosis of influenza from Nasopharyngeal swab specimens and should not be used as a sole basis for treatment. Nasal washings and aspirates are unacceptable for Xpert Xpress SARS-CoV-2/FLU/RSV testing.  Fact Sheet for Patients: BloggerCourse.com  Fact Sheet for Healthcare Providers: SeriousBroker.it  This test is not yet approved or cleared by the [place] FDA and has been authorized for detection and/or diagnosis of SARS-CoV-2 by FDA under an Emergency Use Authorization (EUA). This  EUA will remain in effect (meaning this test can be used) for the duration of the COVID-19 declaration under Section 564(b)(1) of the Act, 21 U.S.C. section 360bbb-3(b)(1), unless the authorization is terminated or revoked.     Resp Syncytial Virus by PCR NEGATIVE NEGATIVE Final    Comment: (NOTE) Fact Sheet for Patients: BloggerCourse.com  Fact Sheet for Healthcare Providers: SeriousBroker.it  This test is not yet approved or cleared by the [place] FDA and has been authorized for detection and/or diagnosis of SARS-CoV-2 by FDA under an Emergency Use Authorization (EUA). This EUA will remain in effect (meaning this test can be used) for the duration of the COVID-19  declaration under Section 564(b)(1) of the Act, 21 U.S.C. section 360bbb-3(b)(1), unless the authorization is terminated or revoked.  Performed at [facility], [address], [place], [place] [address]      Radiology Studies: DG Chest Port 1 View Result Date: 09/18/2023 CLINICAL DATA:  Two-week history of shortness of breath EXAM: PORTABLE CHEST 1 VIEW COMPARISON:  Chest radiograph dated 04/30/2023 FINDINGS: Normal lung volumes. No focal consolidations. Blunting of the left costophrenic angle. Mildly increased enlargement of the cardiomediastinal silhouette. No acute osseous abnormality. IMPRESSION: 1. Increased cardiomegaly. 2. Blunting of the left costophrenic angle, which may reflect pleural effusion. Electronically Signed   By: Agustin Cree M.D.   On: 09/18/2023 16:30     Scheduled Meds:  vitamin C  250 mg Oral Daily   bisacodyl  20 mg Oral Once   carvedilol  12.5 mg Oral BID WC   cholecalciferol  1,000 Units Oral Daily   DULoxetine  30 mg Oral Daily   furosemide  80 mg Intravenous BID   gabapentin  800 mg Oral TID   HYDROcodone-acetaminophen  1 tablet Oral QID   levETIRAcetam  250 mg Oral BID   macitentan  10 mg Oral Daily   pantoprazole (PROTONIX) IV  40 mg Intravenous Q12H   polyethylene glycol  17 g Oral BID   polyethylene glycol-electrolytes  4,000 mL Oral Once   Riociguat  2 mg Oral TID   rOPINIRole  1 mg Oral QHS   rosuvastatin  20 mg Oral Daily   senna-docusate  2 tablet Oral QHS   spironolactone  25 mg Oral Daily   Continuous Infusions:   LOS: 2 days    Time spent:    Barnetta Chapel, MD [facility]   09/20/2023, 12:12 PM

## 2023-09-20 NOTE — Interval H&P Note (Signed)
History and Physical Interval Note:  09/20/2023 9:20 AM  Keith [name]  has presented today for surgery, with the diagnosis of Anemia.  The various methods of treatment have been discussed with the patient and family. After consideration of risks, benefits and other options for treatment, the patient has consented to  Procedure(s): ESOPHAGOGASTRODUODENOSCOPY (EGD) WITH PROPOFOL (N/A) as a surgical intervention.  The patient's history has been reviewed, patient examined, no change in status, stable for surgery.  I have reviewed the patient's chart and labs.  Questions were answered to the patient's satisfaction.    Hgb improved to 8.1 this AM  Charlie Pitter III

## 2023-09-20 NOTE — Op Note (Signed)
[facility] Patient Name: Keith [name] Procedure Date : 09/20/2023 MRN: [id number] Attending MD: Starr Lake. Myrtie Neither , MD, [id number] Date of Birth: 1951-07-10 CSN: [id number] Age: 72 Admit Type: Inpatient Procedure:                Upper GI endoscopy Indications:              Iron deficiency anemia secondary to chronic blood                            loss, Heme positive stool                           History of peptic ulcer in 2015                           Clinical details in yesterday's inpatient GI                            consult note Providers:                Sherilyn Cooter L. Myrtie Neither, MD, Lorenza Evangelist, RN, Rozetta Nunnery, Technician Referring MD:             [facility] Medicines:                Monitored Anesthesia Care Complications:            No immediate complications. Estimated Blood Loss:     Estimated blood loss was minimal. Procedure:                Pre-Anesthesia Assessment:                           - Prior to the procedure, a History and Physical                            was performed, and patient medications and                            allergies were reviewed. The patient's tolerance of                            previous anesthesia was also reviewed. The risks                            and benefits of the procedure and the sedation                            options and risks were discussed with the patient.                            All questions were answered, and informed consent                            was obtained. Prior  Anticoagulants: The patient has                            taken Eliquis (apixaban), last dose was 2 days                            prior to procedure. ASA Grade Assessment: III - A                            patient with severe systemic disease. After                            reviewing the risks and benefits, the patient was                            deemed in satisfactory condition to  undergo the                            procedure.                           After obtaining informed consent, the endoscope was                            passed under direct vision. Throughout the                            procedure, the patient's blood pressure, pulse, and                            oxygen saturations were monitored continuously. The                            [id number]-H190 ([id number]) Olympus endoscope was introduced                            through the mouth, and advanced to the second part                            of duodenum. The upper GI endoscopy was                            accomplished without difficulty. The patient                            tolerated the procedure well. Scope In: Scope Out: Findings:      The esophagus was normal.      Striped moderately erythematous mucosa without bleeding was found in the       entire examined stomach. Biopsies were taken with a cold forceps for       histology. (Antrum and body in 1 jar to rule out H. pylori)      The exam of the stomach was otherwise normal.      The cardia and gastric fundus were normal on retroflexion.  The examined duodenum was normal. Impression:               - Normal esophagus.                           - Erythematous mucosa in the stomach. Biopsied.                           - Normal examined duodenum. Recommendation:           - Return patient to hospital ward for ongoing care.                           - Clear liquid diet today.                           - Perform a colonoscopy tomorrow. Procedure Code(s):        --- Professional ---                           [phone], Esophagogastroduodenoscopy, flexible,                            transoral; with biopsy, single or multiple Diagnosis Code(s):        --- Professional ---                           K31.89, Other diseases of stomach and duodenum                           D50.0, Iron deficiency anemia secondary to blood                             loss (chronic)                           R19.5, Other fecal abnormalities CPT copyright 2022 American Medical Association. All rights reserved. The codes documented in this report are preliminary and upon coder review may  be revised to meet current compliance requirements. Kayton Ripp L. Myrtie Neither, MD 09/20/2023 11:03:04 AM This report has been signed electronically. Number of Addenda: 0

## 2023-09-20 NOTE — Anesthesia Procedure Notes (Signed)
Procedure Name: MAC Date/Time: 09/20/2023 10:51 AM  Performed by: Gwenyth Allegra, CRNAPre-anesthesia Checklist: Emergency Drugs available, Patient identified, Suction available, Patient being monitored and Timeout performed Patient Re-evaluated:Patient Re-evaluated prior to induction Oxygen Delivery Method: Nasal cannula Preoxygenation: Pre-oxygenation with 100% oxygen Induction Type: IV induction

## 2023-09-21 ENCOUNTER — Encounter (HOSPITAL_COMMUNITY): Payer: Self-pay | Admitting: Internal Medicine

## 2023-09-21 ENCOUNTER — Inpatient Hospital Stay (HOSPITAL_COMMUNITY): Payer: Medicare Other | Admitting: Anesthesiology

## 2023-09-21 ENCOUNTER — Other Ambulatory Visit: Payer: Self-pay | Admitting: Internal Medicine

## 2023-09-21 ENCOUNTER — Encounter (HOSPITAL_COMMUNITY)
Admission: EM | Disposition: A | Discharge status: Home / Self Care | Payer: Self-pay | Source: Home / Self Care | Attending: Internal Medicine

## 2023-09-21 DIAGNOSIS — K529 Noninfective gastroenteritis and colitis, unspecified: Secondary | ICD-10-CM

## 2023-09-21 DIAGNOSIS — K2961 Other gastritis with bleeding: Secondary | ICD-10-CM

## 2023-09-21 DIAGNOSIS — K6289 Other specified diseases of anus and rectum: Secondary | ICD-10-CM | POA: Diagnosis not present

## 2023-09-21 DIAGNOSIS — K648 Other hemorrhoids: Secondary | ICD-10-CM

## 2023-09-21 DIAGNOSIS — K635 Polyp of colon: Secondary | ICD-10-CM

## 2023-09-21 DIAGNOSIS — D120 Benign neoplasm of cecum: Secondary | ICD-10-CM

## 2023-09-21 DIAGNOSIS — K5730 Diverticulosis of large intestine without perforation or abscess without bleeding: Secondary | ICD-10-CM | POA: Diagnosis not present

## 2023-09-21 DIAGNOSIS — D124 Benign neoplasm of descending colon: Secondary | ICD-10-CM

## 2023-09-21 DIAGNOSIS — K642 Third degree hemorrhoids: Secondary | ICD-10-CM

## 2023-09-21 DIAGNOSIS — K623 Rectal prolapse: Secondary | ICD-10-CM

## 2023-09-21 DIAGNOSIS — D649 Anemia, unspecified: Secondary | ICD-10-CM

## 2023-09-21 DIAGNOSIS — D500 Iron deficiency anemia secondary to blood loss (chronic): Secondary | ICD-10-CM

## 2023-09-21 DIAGNOSIS — K644 Residual hemorrhoidal skin tags: Secondary | ICD-10-CM

## 2023-09-21 DIAGNOSIS — D509 Iron deficiency anemia, unspecified: Secondary | ICD-10-CM

## 2023-09-21 HISTORY — PX: BIOPSY: SHX5522

## 2023-09-21 HISTORY — PX: COLONOSCOPY WITH PROPOFOL: SHX5780

## 2023-09-21 HISTORY — PX: POLYPECTOMY: SHX5525

## 2023-09-21 LAB — CBC WITH DIFFERENTIAL/PLATELET
Abs Immature Granulocytes: 0.02 K/uL (ref 0.00–0.07)
Basophils Absolute: 0 K/uL (ref 0.0–0.1)
Basophils Relative: 0 %
Eosinophils Absolute: 0.2 K/uL (ref 0.0–0.5)
Eosinophils Relative: 3 %
HCT: 29.2 % — ABNORMAL LOW (ref 39.0–52.0)
Hemoglobin: 8.6 g/dL — ABNORMAL LOW (ref 13.0–17.0)
Immature Granulocytes: 0 %
Lymphocytes Relative: 8 %
Lymphs Abs: 0.6 K/uL — ABNORMAL LOW (ref 0.7–4.0)
MCH: 23.9 pg — ABNORMAL LOW (ref 26.0–34.0)
MCHC: 29.5 g/dL — ABNORMAL LOW (ref 30.0–36.0)
MCV: 81.1 fL (ref 80.0–100.0)
Monocytes Absolute: 0.8 K/uL (ref 0.1–1.0)
Monocytes Relative: 12 %
Neutro Abs: 5.3 K/uL (ref 1.7–7.7)
Neutrophils Relative %: 77 %
Platelets: 389 K/uL (ref 150–400)
RBC: 3.6 MIL/uL — ABNORMAL LOW (ref 4.22–5.81)
RDW: 16.9 % — ABNORMAL HIGH (ref 11.5–15.5)
WBC: 6.9 K/uL (ref 4.0–10.5)
nRBC: 0 % (ref 0.0–0.2)

## 2023-09-21 LAB — RENAL FUNCTION PANEL
Albumin: 3 g/dL — ABNORMAL LOW (ref 3.5–5.0)
Anion gap: 9 (ref 5–15)
BUN: 12 mg/dL (ref 8–23)
CO2: 28 mmol/L (ref 22–32)
Calcium: 9.5 mg/dL (ref 8.9–10.3)
Chloride: 100 mmol/L (ref 98–111)
Creatinine, Ser: 0.96 mg/dL (ref 0.61–1.24)
GFR, Estimated: >60 mL/min (ref >60)
Glucose, Bld: 84 mg/dL (ref 70–99)
Phosphorus: 3.3 mg/dL (ref 2.5–4.6)
Potassium: 3.4 mmol/L — ABNORMAL LOW (ref 3.5–5.1)
Sodium: 137 mmol/L (ref 135–145)

## 2023-09-21 LAB — GLUCOSE, CAPILLARY: Glucose-Capillary: 83 mg/dL (ref 70–99)

## 2023-09-21 SURGERY — COLONOSCOPY WITH PROPOFOL
Anesthesia: Monitor Anesthesia Care

## 2023-09-21 MED ORDER — LIDOCAINE 2% (20 MG/ML) 5 ML SYRINGE
INTRAMUSCULAR | Status: DC | PRN
  Administered 2023-09-21: 100 mg via INTRAVENOUS

## 2023-09-21 MED ORDER — PROPOFOL 500 MG/50ML IV EMUL
INTRAVENOUS | Status: DC | PRN
  Administered 2023-09-21: 100 ug/kg/min via INTRAVENOUS

## 2023-09-21 MED ORDER — HYDROCORTISONE ACETATE 25 MG RE SUPP: 25.0000 mg | Freq: Every day | RECTAL | 0 refills | Status: DC

## 2023-09-21 MED ORDER — POTASSIUM CHLORIDE 20 MEQ PO PACK: 40.0000 meq | PACK | Freq: Once | ORAL | Status: DC

## 2023-09-21 MED ORDER — PHENYLEPHRINE 80 MCG/ML (10ML) SYRINGE FOR IV PUSH (FOR BLOOD PRESSURE SUPPORT)
PREFILLED_SYRINGE | INTRAVENOUS | Status: DC | PRN
  Administered 2023-09-21 (×2): 80 ug via INTRAVENOUS

## 2023-09-21 MED ORDER — SODIUM CHLORIDE 0.9 % IV SOLN: INTRAVENOUS | Status: DC

## 2023-09-21 MED ORDER — PANTOPRAZOLE SODIUM 40 MG PO TBEC: 40.0000 mg | DELAYED_RELEASE_TABLET | Freq: Two times a day (BID) | ORAL | 1 refills | Status: DC

## 2023-09-21 MED ORDER — PANTOPRAZOLE SODIUM 40 MG PO TBEC
40.0000 mg | DELAYED_RELEASE_TABLET | Freq: Two times a day (BID) | ORAL | Status: DC
  Administered 2023-09-21 – 2023-09-25 (×8): 40 mg via ORAL
  Filled 2023-09-21 (×9): qty 1

## 2023-09-21 MED ORDER — TORSEMIDE 40 MG PO TABS: 60.0000 mg | ORAL_TABLET | Freq: Every day | ORAL | 0 refills | Status: DC

## 2023-09-21 MED ORDER — HYDROCORTISONE ACETATE 25 MG RE SUPP
25.0000 mg | Freq: Every day | RECTAL | Status: DC
  Administered 2023-09-21 – 2023-09-24 (×3): 25 mg via RECTAL
  Filled 2023-09-21 (×5): qty 1

## 2023-09-21 MED ORDER — PROPOFOL 10 MG/ML IV BOLUS
INTRAVENOUS | Status: DC | PRN
  Administered 2023-09-21: 40 mg via INTRAVENOUS
  Administered 2023-09-21: 60 mg via INTRAVENOUS

## 2023-09-21 MED ORDER — SENNOSIDES-DOCUSATE SODIUM 8.6-50 MG PO TABS: 2.0000 | ORAL_TABLET | Freq: Every day | ORAL | 0 refills | Status: AC

## 2023-09-21 MED ORDER — CARVEDILOL 6.25 MG PO TABS: 6.2500 mg | ORAL_TABLET | Freq: Two times a day (BID) | ORAL | 11 refills | Status: DC

## 2023-09-21 MED ORDER — DULOXETINE HCL 30 MG PO CPEP: 30.0000 mg | ORAL_CAPSULE | Freq: Every day | ORAL | 0 refills | Status: DC

## 2023-09-21 MED ORDER — POTASSIUM CHLORIDE CRYS ER 20 MEQ PO TBCR
40.0000 meq | EXTENDED_RELEASE_TABLET | Freq: Once | ORAL | Status: AC
  Administered 2023-09-21: 40 meq via ORAL
  Filled 2023-09-21: qty 2

## 2023-09-21 SURGICAL SUPPLY — 21 items
ELECT REM PT RETURN 9FT ADLT (ELECTROSURGICAL)
ELECTRODE REM PT RTRN 9FT ADLT (ELECTROSURGICAL) IMPLANT
FCP BXJMBJMB 240X2.8X (CUTTING FORCEPS)
FLOOR PAD 36X40 (MISCELLANEOUS) ×2
FORCEPS BIOP RAD 4 LRG CAP 4 (CUTTING FORCEPS) IMPLANT
FORCEPS BIOP RJ4 240 W/NDL (CUTTING FORCEPS)
FORCEPS BXJMBJMB 240X2.8X (CUTTING FORCEPS) IMPLANT
INJECTOR/SNARE I SNARE (MISCELLANEOUS) IMPLANT
LUBRICANT JELLY 4.5OZ STERILE (MISCELLANEOUS) IMPLANT
MANIFOLD NEPTUNE II (INSTRUMENTS) IMPLANT
NDL SCLEROTHERAPY 25GX240 (NEEDLE) IMPLANT
NEEDLE SCLEROTHERAPY 25GX240 (NEEDLE) IMPLANT
PAD FLOOR 36X40 (MISCELLANEOUS) ×2 IMPLANT
PROBE APC STR FIRE (PROBE) IMPLANT
PROBE INJECTION GOLD 7FR (MISCELLANEOUS) IMPLANT
SNARE ROTATE MED OVAL 20MM (MISCELLANEOUS) IMPLANT
SYR 50ML LL SCALE MARK (SYRINGE) IMPLANT
TRAP SPECIMEN MUCOUS 40CC (MISCELLANEOUS) IMPLANT
TUBING ENDO SMARTCAP PENTAX (MISCELLANEOUS) IMPLANT
TUBING IRRIGATION ENDOGATOR (MISCELLANEOUS) ×2 IMPLANT
WATER STERILE IRR 1000ML POUR (IV SOLUTION) IMPLANT

## 2023-09-21 NOTE — Transfer of Care (Signed)
Immediate Anesthesia Transfer of Care Note  Patient: Keith [name]  Procedure(s) Performed: COLONOSCOPY WITH PROPOFOL POLYPECTOMY BIOPSY  Patient Location: PACU  Anesthesia Type:MAC  Level of Consciousness: drowsy and patient cooperative  Airway & Oxygen Therapy: Patient Spontanous Breathing and Patient connected to face mask oxygen  Post-op Assessment: Report given to RN and Post -op Vital signs reviewed and stable  Post vital signs: Reviewed and stable  Last Vitals:  Vitals Value Taken Time  BP 114/43 09/21/23 0930  Temp    Pulse 80 09/21/23 0930  Resp 13 09/21/23 0930  SpO2 99 % 09/21/23 0930  Vitals shown include unfiled device data.  Last Pain:  Vitals:   09/21/23 0829  TempSrc: Temporal  PainSc: 3          Complications: No notable events documented.

## 2023-09-21 NOTE — Op Note (Signed)
[facility] Patient Name: Keith [name] Procedure Date : 09/21/2023 MRN: [id number] Attending MD: Doristine Locks , MD, [id number] Date of Birth: [birth year] CSN: [id number] Age: 72 Admit Type: Inpatient Procedure:                Colonoscopy Indications:              Heme positive stool, Acute on chronic anemia, Iron                            deficiency anemia Providers:                Doristine Locks, MD, Eliberto Ivory, RN, Brion Aliment, Technician Referring MD:              Medicines:                Monitored Anesthesia Care Complications:            No immediate complications. Estimated Blood Loss:     Estimated blood loss was minimal. Procedure:                Pre-Anesthesia Assessment:                           - Prior to the procedure, a History and Physical                            was performed, and patient medications and                            allergies were reviewed. The patient's tolerance of                            previous anesthesia was also reviewed. The risks                            and benefits of the procedure and the sedation                            options and risks were discussed with the patient.                            All questions were answered, and informed consent                            was obtained. Prior Anticoagulants: The patient has                            taken Eliquis (apixaban), last dose was 2 days                            prior to procedure. ASA Grade Assessment: III - A  patient with severe systemic disease. After                            reviewing the risks and benefits, the patient was                            deemed in satisfactory condition to undergo the                            procedure.                           After obtaining informed consent, the colonoscope                            was passed under direct vision. Throughout the                             procedure, the patient's blood pressure, pulse, and                            oxygen saturations were monitored continuously. The                            CF-HQ190L ([id number] Olympus coloscope was                            introduced through the anus and advanced to the the                            terminal ileum. The colonoscopy was performed                            without difficulty. The patient tolerated the                            procedure well. The quality of the bowel                            preparation was good. The terminal ileum, ileocecal                            valve, appendiceal orifice, and rectum were                            photographed. Scope In: 8:51:24 AM Scope Out: 9:20:49 AM Scope Withdrawal Time: 0 hours 18 minutes 44 seconds  Total Procedure Duration: 0 hours 29 minutes 25 seconds  Findings:      Hemorrhoids were found on perianal exam.      Two sessile polyps were found in the descending colon and cecum. The       polyps were 3 to 4 mm in size. These polyps were removed with a cold       snare. Resection and retrieval were complete. Estimated blood loss was  minimal.      Multiple small-mouthed diverticula were found in the sigmoid colon. Each       of these were lavaged and no active bleeding or stigmata of recent       bleeding noted.      Localized inflammation characterized by congestion (edema), erythema,       and pseudopolyps was found in the distal rectum. This was limited to the       distal 1-2 cm of the rectum. Biopsies were taken with a cold forceps for       histology. Estimated blood loss was minimal.      Non-bleeding external and internal hemorrhoids were found during       retroflexion. The hemorrhoids were medium-sized.      The terminal ileum appeared normal. Impression:               - Hemorrhoids found on perianal exam.                           - Two 3 to 4 mm polyps in the descending colon  and                            in the cecum, removed with a cold snare. Resected                            and retrieved.                           - Diverticulosis in the sigmoid colon.                           - Localized inflammation was found in the distal                            rectum. Biopsied.                           - Non-bleeding external and internal hemorrhoids.                           - The examined portion of the ileum was normal.                           - There was no blood noted throughout the GI lumen                            on today's study. Recommendation:           - Return patient to hospital ward for ongoing care.                           - Advance diet as tolerated.                           - Continue present medications.                           -  Await pathology results.                           - Resume Eliquis (apixaban) at prior dose in 2 days.                           - Await pathology results.                           - Use hydrocortisone suppository 25 mg 1 per rectum                            once a day for 1 week.                           - Continue serial CBC checks with additional blood                            products as needed per protocol.                           - Inpatient GI service will follow peripherally at                            this juncture. Please do not hesitate to contact us                            with additional questions or concerns. Will make                            arrangements for outpatient follow-up in the GI                            clinic.                           - After hospital discharge, plan for repeat CBC                            check 7-10 days after hospital discharge to ensure                            returning back to baseline.                           - If continued iron deficiency anemia, will                            consider additional small bowel interrogation with                             Video Capsule Endoscopy (VCE). Will await pathology                            results and response  to therapy first. Procedure Code(s):        --- Professional ---                           [phone], Colonoscopy, flexible; with removal of                            tumor(s), polyp(s), or other lesion(s) by snare                            technique                           45380, 59, Colonoscopy, flexible; with biopsy,                            single or multiple Diagnosis Code(s):        --- Professional ---                           D12.4, Benign neoplasm of descending colon                           D12.0, Benign neoplasm of cecum                           K64.8, Other hemorrhoids                           K62.89, Other specified diseases of anus and rectum                           R19.5, Other fecal abnormalities                           D62, Acute posthemorrhagic anemia                           D50.9, Iron deficiency anemia, unspecified                           K57.30, Diverticulosis of large intestine without                            perforation or abscess without bleeding CPT copyright 2022 [employer]. All rights reserved. The codes documented in this report are preliminary and upon coder review may  be revised to meet current compliance requirements. Doristine Locks, MD 09/21/2023 9:50:28 AM Number of Addenda: 0

## 2023-09-21 NOTE — Anesthesia Preprocedure Evaluation (Addendum)
Anesthesia Evaluation  Patient identified by MRN, date of birth, ID band Patient awake    Reviewed: Allergy & Precautions, NPO status , Patient's Chart, lab work & pertinent test results, reviewed documented beta blocker date and time   Airway Mallampati: II  TM Distance: >3 FB Neck ROM: Full    Dental no notable dental hx. (+) Teeth Intact, Dental Advisory Given   Pulmonary former smoker, PE   Pulmonary exam normal breath sounds clear to auscultation       Cardiovascular hypertension, Pt. on medications and Pt. on home beta blockers +CHF  Normal cardiovascular exam Rhythm:Regular Rate:Normal    1. Left ventricular ejection fraction, by estimation, is 70 to 75%. The left ventricle has hyperdynamic function. The left ventricle has no regional wall motion abnormalities. There is mild concentric left ventricular hypertrophy. Left ventricular diastolic parameters are consistent with Grade I diastolic dysfunction (impaired relaxation).  2. Right ventricular systolic function is normal. The right ventricular size is normal.  3. The mitral valve is normal in structure. No evidence of mitral valve regurgitation. No evidence of mitral stenosis.  4. The aortic valve is normal in structure. Aortic valve regurgitation is not visualized. No aortic stenosis is present.      Neuro/Psych negative neurological ROS  negative psych ROS   GI/Hepatic Neg liver ROS, hiatal hernia, PUD,GERD  Medicated,,GIB   Endo/Other  diabetes    Renal/GU Renal InsufficiencyRenal diseaseLab Results      Component                Value               Date                      NA                       142                 09/20/2023                CL                       106                 09/20/2023                K                        3.5                 09/20/2023                CO2                      26                  09/20/2023                BUN                       19                  09/20/2023                CREATININE  1.24                09/20/2023                GFRNONAA                 >60                 09/20/2023                CALCIUM                  9.8                 09/20/2023                PHOS                     3.0                 03/05/2022                ALBUMIN                  3.0 (L)             09/20/2023                GLUCOSE                  97                  09/20/2023             negative genitourinary   Musculoskeletal  (+) Arthritis , Osteoarthritis,    Abdominal   Peds  Hematology  (+) Blood dyscrasia, anemia Lab Results      Component                Value               Date                      WBC                      6.9                 09/21/2023                HGB                      8.6 (L)             09/21/2023                HCT                      29.2 (L)            09/21/2023                MCV                      81.1                09/21/2023                PLT  389                 09/21/2023              Anesthesia Other Findings 72/M with history of saddle PE/DVT, cardiac arrest, requiring tPA in 2021, recurrent PE in 2023 after stopping DOAC, chronic diastolic CHF, RV failure, pulmonary hypertension, type 2 diabetes mellitus, spinal stenosis, multiple back surgeries, wheelchair-bound was sent to the ED from cardiology clinic for worsening anemia, progressive dyspnea on exertion  Reproductive/Obstetrics                             Anesthesia Physical Anesthesia Plan  ASA: 3  Anesthesia Plan: MAC   Post-op Pain Management:    Induction: Intravenous  PONV Risk Score and Plan: 1 and Propofol infusion and Treatment may vary due to age or medical condition  Airway Management Planned: Simple Face Mask and Nasal Cannula  Additional Equipment: None  Intra-op Plan:   Post-operative Plan:   Informed Consent: I  have reviewed the patients History and Physical, chart, labs and discussed the procedure including the risks, benefits and alternatives for the proposed anesthesia with the patient or authorized representative who has indicated his/her understanding and acceptance.     Dental advisory given  Plan Discussed with: CRNA  Anesthesia Plan Comments:        Anesthesia Quick Evaluation

## 2023-09-21 NOTE — Anesthesia Postprocedure Evaluation (Signed)
Anesthesia Post Note  Patient: Keith [name]  Procedure(s) Performed: COLONOSCOPY WITH PROPOFOL POLYPECTOMY BIOPSY     Patient location during evaluation: PACU Anesthesia Type: MAC Level of consciousness: awake and alert Pain management: pain level controlled Vital Signs Assessment: post-procedure vital signs reviewed and stable Respiratory status: spontaneous breathing, nonlabored ventilation, respiratory function stable and patient connected to nasal cannula oxygen Cardiovascular status: stable and blood pressure returned to baseline Postop Assessment: no apparent nausea or vomiting Anesthetic complications: no   No notable events documented.  Last Vitals:  Vitals:   09/21/23 1000 09/21/23 1022  BP: 116/67 115/81  Pulse: 80 85  Resp: 12 17  Temp: 36.7 C 37 C  SpO2: 95% 93%    Last Pain:  Vitals:   09/21/23 1100  TempSrc:   PainSc: 0-No pain                 Earl Lites P Lailie Smead

## 2023-09-21 NOTE — NC FL2 (Signed)
MEDICAID FL2 LEVEL OF CARE FORM     IDENTIFICATION  Patient Name: Keith [name] Birthdate: 1951/09/18 Sex: male Admission Date (Current Location): 09/18/2023  [facility] and [place] Number:  Producer, television/film/video and Address:  [facility] [place], [address] [place], [place] [address]      Provider Number: [id number]  Attending Physician Name and Address:  Barnetta Chapel, MD  Relative Name and Phone Number:       Current Level of Care: Hospital Recommended Level of Care: Skilled Nursing Facility Prior Approval Number:    Date Approved/Denied:   PASRR Number: [id number]  Discharge Plan: SNF    Current Diagnoses: Patient Active Problem List   Diagnosis Date Noted   Iron deficiency anemia 09/21/2023   Diverticulosis of colon without hemorrhage 09/21/2023   Grade III internal hemorrhoids 09/21/2023   Noninfectious gastroenteritis 09/21/2023   Cecal polyp 09/21/2023   Adenomatous polyp of descending colon 09/21/2023   Acute on chronic anemia 09/21/2023   Heme positive stool 09/20/2023   Anemia due to chronic blood loss 09/20/2023   Symptomatic anemia 09/18/2023   Pressure injury of left buttock, stage 2 (HCC) 07/01/2023   Bilateral lower extremity edema 03/20/2023   Obesity (BMI 30-39.9) 01/02/2023   Gastroesophageal reflux disease 12/14/2022   Acute respiratory failure with hypoxia (HCC) 10/23/2022   Rheumatoid factor positive 10/23/2022   Chronic pulmonary hypertension (HCC) 10/23/2022   Chest pain 03/01/2022   Elevated liver enzymes 03/01/2022   Myofascial pain 10/30/2021   Controlled type 2 diabetes mellitus with hyperglycemia, without long-term current use of insulin (HCC)    Bilateral leg weakness 08/08/2021   CAP (community acquired pneumonia) 11/19/2020   Loculated pleural effusion 11/19/2020   DNR (do not resuscitate) 11/19/2020   Closed right ankle fracture    Labile blood glucose    Hypokalemia    Drug induced  constipation    Debility    Hyponatremia    Diabetic peripheral neuropathy (HCC)    Acute blood loss anemia    Chronic pain syndrome    Postoperative pain    Closed right trimalleolar fracture 07/02/2020   Chronic respiratory failure with hypoxia (HCC)    Lumbar spinal stenosis 06/01/2020   Spinal cord compression (HCC) 04/02/2020   Thoracic myelopathy 07/25/2018   Hyperlipidemia associated with type 2 diabetes mellitus (HCC) 11/14/2016   Type 2 diabetes mellitus with diabetic neuropathy, unspecified (HCC) 11/14/2016   Routine general medical examination at a health care facility 11/14/2015   Hiatal hernia 08/30/2014   CKD (chronic kidney disease), stage III (HCC) 04/10/2013   Allergic rhinitis 04/10/2013   Spinal stenosis of lumbar region 11/06/2012   Allergic rhinitis due to pollen 01/15/2011   Osteoarthritis 11/02/2008   HIP PAIN, LEFT 02/09/2008   Morbid obesity (HCC) BMI 36+ OSA,GERD,DM II,OA 11/07/2007   ERECTILE DYSFUNCTION, MILD 11/07/2007   Essential hypertension 11/07/2007    Orientation RESPIRATION BLADDER Height & Weight     Self, Time, Situation, Place  Normal Continent Weight: 225 lb 12 oz (102.4 kg) Height:  5\' 10"  (177.8 cm)  BEHAVIORAL SYMPTOMS/MOOD NEUROLOGICAL BOWEL NUTRITION STATUS      Continent    AMBULATORY STATUS COMMUNICATION OF NEEDS Skin   Extensive Assist   Normal                       Personal Care Assistance Level of Assistance  Bathing, Feeding, Dressing Bathing Assistance: Limited assistance Feeding assistance: Limited assistance  Functional Limitations Info  Sight, Hearing, Speech Sight Info: Adequate Hearing Info: Adequate Speech Info: Adequate    SPECIAL CARE FACTORS FREQUENCY  PT (By licensed PT), OT (By licensed OT)                    Contractures Contractures Info: Not present    Additional Factors Info  Code Status Code Status Info: FULL CODE             Current Medications (09/21/2023):  This is  the current hospital active medication list Current Facility-Administered Medications  Medication Dose Route Frequency Provider Last Rate Last Admin   acetaminophen (TYLENOL) tablet 650 mg  650 mg Oral Q6H PRN Sherrilyn Rist, MD       Or   acetaminophen (TYLENOL) suppository 650 mg  650 mg Rectal Q6H PRN Charlie Pitter III, MD       albuterol (PROVENTIL) (2.5 MG/3ML) 0.083% nebulizer solution 2.5 mg  2.5 mg Nebulization Q2H PRN Sherrilyn Rist, MD       ascorbic acid (VITAMIN C) tablet 250 mg  250 mg Oral Daily Charlie Pitter III, MD   250 mg at 09/21/23 1100   carvedilol (COREG) tablet 12.5 mg  12.5 mg Oral BID WC Charlie Pitter III, MD   12.5 mg at 09/21/23 1101   cholecalciferol (VITAMIN D3) 25 MCG (1000 UNIT) tablet 1,000 Units  1,000 Units Oral Daily Charlie Pitter III, MD   1,000 Units at 09/21/23 1101   DULoxetine (CYMBALTA) DR capsule 30 mg  30 mg Oral Daily Charlie Pitter III, MD   30 mg at 09/21/23 1102   furosemide (LASIX) injection 80 mg  80 mg Intravenous BID Charlie Pitter III, MD   80 mg at 09/21/23 1102   gabapentin (NEURONTIN) capsule 800 mg  800 mg Oral TID Charlie Pitter III, MD   800 mg at 09/21/23 1101   HYDROcodone-acetaminophen (NORCO) 7.5-325 MG per tablet 1 tablet  1 tablet Oral QID Charlie Pitter III, MD   1 tablet at 09/21/23 1102   hydrocortisone (ANUSOL-HC) suppository 25 mg  25 mg Rectal QHS Cirigliano, Vito V, DO       levETIRAcetam (KEPPRA) tablet 250 mg  250 mg Oral BID Charlie Pitter III, MD   250 mg at 09/21/23 1100   macitentan (OPSUMIT) tablet 10 mg  10 mg Oral Daily Charlie Pitter III, MD   10 mg at 09/21/23 1100   naloxone (NARCAN) nasal spray 4 mg/0.1 mL  1 spray Nasal PRN Sherrilyn Rist, MD       ondansetron (ZOFRAN) tablet 4 mg  4 mg Oral Q6H PRN Charlie Pitter III, MD       Or   ondansetron (ZOFRAN) injection 4 mg  4 mg Intravenous Q6H PRN Charlie Pitter III, MD       pantoprazole (PROTONIX) EC tablet 40 mg  40 mg Oral BID Klus, Jesse L, RPH        polyethylene glycol (MIRALAX / GLYCOLAX) packet 17 g  17 g Oral BID Charlie Pitter III, MD   17 g at 09/20/23 1214   potassium chloride (KLOR-CON) packet 40 mEq  40 mEq Oral Once Berton Mount I, MD       Riociguat TABS 2 mg  2 mg Oral TID Charlie Pitter III, MD   2 mg at 09/21/23 1102   rOPINIRole (REQUIP) tablet 1 mg  1 mg Oral QHS Charlie Pitter III, MD   1 mg at 09/20/23 2114   rosuvastatin (CRESTOR) tablet 20 mg  20 mg Oral Daily Charlie Pitter III, MD   20 mg at 09/21/23 1101   senna-docusate (Senokot-S) tablet 2 tablet  2 tablet Oral QHS Charlie Pitter III, MD   2 tablet at 09/19/23 2224   spironolactone (ALDACTONE) tablet 25 mg  25 mg Oral Daily Charlie Pitter III, MD   25 mg at 09/21/23 1101   traZODone (DESYREL) tablet 25-50 mg  25-50 mg Oral QHS PRN Sherrilyn Rist, MD   50 mg at 09/19/23 2224     Discharge Medications: Please see discharge summary for a list of discharge medications.  Relevant Imaging Results:  Relevant Lab Results:   Additional Information SSN: [id number]  Deatra Robinson, [place]

## 2023-09-21 NOTE — Progress Notes (Signed)
PROGRESS NOTE    Keith [name]  MVH:[id number] DOB: 1950-11-30 DOA: 09/18/2023 PCP: [place], [name], MD    Brief history: Patient is a 72 year old male with past medical history significant for saddle PE/DVT, cardiac arrest, requiring tPA in 2021, recurrent PE in 2023 after stopping DOAC, chronic diastolic CHF, RV failure, pulmonary hypertension, type 2 diabetes mellitus, spinal stenosis, and multiple back surgeries.  Patient is wheelchair-bound.  Patient was sent to the ED from cardiology clinic with worsening anemia, progressive dyspnea on exertion.  Patient has been on naproxen.  Some report of recent darker stools.  In the ER he was hypoxic 87% on room air, hemoglobin 6.9, heme positive stools.  Patient was transfused 1 unit of packed red blood cells.  Pertinent labs: Iron of 19, TIBC of 350, TSAT of 5 and ferritin of 20.  CBC done today revealed hemoglobin of 8.1 g/dL and hematocrit of 84.1.  09/20/2023: Patient underwent EGD that revealed normal esophagus and erythematous stomach.  GI plans to proceed with colonoscopy tomorrow.  09/21/2023: Patient underwent colonoscopy today.  Colonoscopy revealed 2 colonic polyps (descending colon and cecum) that were removed, sigmoid colon diverticulosis, localized inflammation involving the distal rectum that was biopsied, nonbleeding external and internal hemorrhoids, normal ileum and no blood noted throughout the GI lumen.  GI team is recommended hydrocortisone rectally, resumption of Eliquis in 2 days time and close monitoring of CBC.  Skilled nursing facility has been recommended.  Will pursue disposition.  Subjective: No new complaints today.   No further bleeding reported.  Assessment and Plan:  Acute GI blood loss anemia  -Hemoglobin 3-4gm lower than baseline -Transfused 1 unit PRBC yesterday. -Repeat hemoglobin of 8.1 g/dL. -EGD revealed erythematous stomach.  For colonoscopy tomorrow. -Significant history of NSAID use. -GI input is  appreciated. -Eliquis is on hold. -Continue PPI. 09/21/2023: Hemoglobin is 8.6 g/dL today.  See above documentation.  Pursue disposition.   Acute on chronic HFpEF Acute chronic RV failure/pulmonary hypertension -Last echo 8/24 with EF 70-75%, mild LVH, grade 1 DD, preserved RV  -considerably volume overloaded, likely worsened by anemia, but improving.  Low albumin is also noted. -Continue IV Lasix 80 Mg twice daily, Aldactone -Continue Adempas/macitentan Strict weights/intake output/daily labs.   Acute hypoxic respiratory failure -Likely multifactorial (symptomatic anemia, HFpEF exacerbation, OSA) -Continue CPAP.    History of recurrent VTE (saddle PE requiring tPA/vasopressor support in 2021-subsequent recurrent PE 2023 while off Eliquis) -Eliquis is on hold.   -GI to advise when to restart. -EGD done today revealed erythematous stomach. -For colonoscopy tomorrow. 09/21/2023: Resume Eliquis in 48 hours.  HTN Continue amlodipine/Coreg   Hyperkalemia -Resolved  CKD stage IIIb Close to baseline Follow electrolytes closely   History of cord compression/spinal stenosis-s/p multiple back surgeries Mostly bed to wheelchair bound PT/OT eval   Chronic pain syndrome Resume usual dosing of narcotics along with Neurontin and Cymbalta -bowel regimen with MiraLAX/senna   ?  Seizure disorder Keppra   Sacral decubitus ulcer Present on admission, per wound care   OSA CPAP   DVT prophylaxis: Code Status:  Family Communication: Disposition Plan:   Consultants:  GI.  Procedures:  -EGD. -Colonoscopy.   Antimicrobials:    Objective: Vitals:   09/21/23 0942 09/21/23 0945 09/21/23 1000 09/21/23 1022  BP: 111/63 113/67 116/67 115/81  Pulse: 82  80 85  Resp: 18  12 17   Temp:   98 F (36.7 C) 98.6 F (37 C)  TempSrc:   Temporal Oral  SpO2: 94% 93% 95%  93%  Weight:      Height:        Intake/Output Summary (Last 24 hours) at 09/21/2023 1506 Last data filed at  09/21/2023 0918 Gross per 24 hour  Intake 1960 ml  Output 2300 ml  Net -340 ml   Filed Weights   09/20/23 0445 09/20/23 1753 09/21/23 0355  Weight: 106.5 kg 99.8 kg 102.4 kg    Examination:  General exam: Obese.  Not in any distress.  Awake and alert.  Patient has deformed left foot (from parachute injury) HEENT: Patient is pale. Neck: Supple. CVS: S1-S2, regular rhythm Lungs: Basilar Rales Abdomen: Soft and nontender. Extremities: Edema.     Data Reviewed:   CBC: Recent Labs  Lab 09/18/23 1116 09/18/23 1507 09/19/23 0917 09/20/23 0743 09/21/23 0614  WBC 7.3 7.3 5.3 5.6 6.9  NEUTROABS  --   --   --   --  5.3  HGB 6.6* 6.9* 7.3* 8.1* 8.6*  HCT 23.9* 24.9* 25.0* 26.8* 29.2*  MCV 83.6 84.4 80.6 79.8* 81.1  PLT 413* 416* 358 379 389   Basic Metabolic Panel: Recent Labs  Lab 09/18/23 1116 09/18/23 1507 09/19/23 0917 09/20/23 0743 09/21/23 1032  NA 140 148* 142 142 137  K 4.3 5.9* 3.6 3.5 3.4*  CL 113* 112* 108 106 100  CO2 22 22 23 26 28   GLUCOSE 122* 119* 139* 97 84  BUN 27* 25* 20 19 12   CREATININE 1.37* 1.40* 1.26* 1.24 0.96  CALCIUM 9.8 10.5* 9.8 9.8 9.5  PHOS  --   --   --   --  3.3   GFR: Estimated Creatinine Clearance: 84.7 mL/min (by C-G formula based on SCr of 0.96 mg/dL). Liver Function Tests: Recent Labs  Lab 09/18/23 1507 09/20/23 0743 09/21/23 1032  AST 39 17  --   ALT 14 14  --   ALKPHOS 68 57  --   BILITOT 0.7 0.8  --   PROT 7.2 6.6  --   ALBUMIN 3.3* 3.0* 3.0*   No results for input(s): "LIPASE", "AMYLASE" in the last 168 hours. No results for input(s): "AMMONIA" in the last 168 hours. Coagulation Profile: No results for input(s): "INR", "PROTIME" in the last 168 hours. Cardiac Enzymes: No results for input(s): "CKTOTAL", "CKMB", "CKMBINDEX", "TROPONINI" in the last 168 hours. BNP (last 3 results) No results for input(s): "PROBNP" in the last 8760 hours. HbA1C: No results for input(s): "HGBA1C" in the last 72  hours. CBG: Recent Labs  Lab 09/21/23 0937  GLUCAP 83   Lipid Profile: No results for input(s): "CHOL", "HDL", "LDLCALC", "TRIG", "CHOLHDL", "LDLDIRECT" in the last 72 hours. Thyroid Function Tests: No results for input(s): "TSH", "T4TOTAL", "FREET4", "T3FREE", "THYROIDAB" in the last 72 hours. Anemia Panel: Recent Labs    09/19/23 1328  VITAMINB12 318  FOLATE 18.4  FERRITIN 20*  TIBC 350  IRON 19*  RETICCTPCT 2.1   Urine analysis:    Component Value Date/Time   COLORURINE YELLOW 03/27/2022 2016   APPEARANCEUR CLOUDY (A) 03/27/2022 2016   LABSPEC 1.020 03/27/2022 2016   PHURINE 8.0 03/27/2022 2016   GLUCOSEU NEGATIVE 03/27/2022 2016   HGBUR NEGATIVE 03/27/2022 2016   HGBUR negative 10/26/2010 0824   BILIRUBINUR NEGATIVE 03/27/2022 2016   BILIRUBINUR n 01/12/2019 1020   KETONESUR NEGATIVE 03/27/2022 2016   PROTEINUR NEGATIVE 03/27/2022 2016   UROBILINOGEN 0.2 01/12/2019 1020   UROBILINOGEN 0.2 04/10/2013 1555   NITRITE POSITIVE (A) 03/27/2022 2016   LEUKOCYTESUR NEGATIVE 03/27/2022 2016   Sepsis  Labs: @LABRCNTIP (procalcitonin:4,lacticidven:4)  ) Recent Results (from the past 240 hours)  Resp panel by RT-PCR (RSV, Flu A&B, Covid) Anterior Nasal Swab     Status: None   Collection Time: 09/18/23  3:07 PM   Specimen: Anterior Nasal Swab  Result Value Ref Range Status   SARS Coronavirus 2 by RT PCR NEGATIVE NEGATIVE Final   Influenza A by PCR NEGATIVE NEGATIVE Final   Influenza B by PCR NEGATIVE NEGATIVE Final    Comment: (NOTE) The Xpert Xpress SARS-CoV-2/FLU/RSV plus assay is intended as an aid in the diagnosis of influenza from Nasopharyngeal swab specimens and should not be used as a sole basis for treatment. Nasal washings and aspirates are unacceptable for Xpert Xpress SARS-CoV-2/FLU/RSV testing.  Fact Sheet for Patients: BloggerCourse.com  Fact Sheet for Healthcare Providers: SeriousBroker.it  This  test is not yet approved or cleared by the [place] FDA and has been authorized for detection and/or diagnosis of SARS-CoV-2 by FDA under an Emergency Use Authorization (EUA). This EUA will remain in effect (meaning this test can be used) for the duration of the COVID-19 declaration under Section 564(b)(1) of the Act, 21 U.S.C. section 360bbb-3(b)(1), unless the authorization is terminated or revoked.     Resp Syncytial Virus by PCR NEGATIVE NEGATIVE Final    Comment: (NOTE) Fact Sheet for Patients: BloggerCourse.com  Fact Sheet for Healthcare Providers: SeriousBroker.it  This test is not yet approved or cleared by the [place] FDA and has been authorized for detection and/or diagnosis of SARS-CoV-2 by FDA under an Emergency Use Authorization (EUA). This EUA will remain in effect (meaning this test can be used) for the duration of the COVID-19 declaration under Section 564(b)(1) of the Act, 21 U.S.C. section 360bbb-3(b)(1), unless the authorization is terminated or revoked.  Performed at [facility], [address] [place], [place] [address]      Radiology Studies: No results found.    Scheduled Meds:  vitamin C  250 mg Oral Daily   carvedilol  12.5 mg Oral BID WC   cholecalciferol  1,000 Units Oral Daily   DULoxetine  30 mg Oral Daily   furosemide  80 mg Intravenous BID   gabapentin  800 mg Oral TID   HYDROcodone-acetaminophen  1 tablet Oral QID   hydrocortisone  25 mg Rectal QHS   levETIRAcetam  250 mg Oral BID   macitentan  10 mg Oral Daily   pantoprazole  40 mg Oral BID   polyethylene glycol  17 g Oral BID   potassium chloride  40 mEq Oral Once   Riociguat  2 mg Oral TID   rOPINIRole  1 mg Oral QHS   rosuvastatin  20 mg Oral Daily   senna-docusate  2 tablet Oral QHS   spironolactone  25 mg Oral Daily   Continuous Infusions:   LOS: 3 days    Time spent:    Barnetta Chapel,  MD [facility]   09/21/2023, 3:06 PM

## 2023-09-21 NOTE — Care Management (Addendum)
Patient with recs for SNF, discussed with MD and patient agreeable to SNF before returning home.  MD states he will hold DC for SNF workup.  Notified CSW for SNF workup

## 2023-09-21 NOTE — Interval H&P Note (Signed)
History and Physical Interval Note:  EGD yes notable for striped gastric erythema (path pending), but no active bleeding or high-grade stigmata of bleeding.  Completed bowel prep overnight without issue.  H/H stable at 8.6/29.2 today.  Plan to proceed with colonoscopy for diagnostic and therapeutic intent for evaluation of iron deficiency anemia, acute on chronic anemia, possible dark stools, and heme positive stool on arrival.  09/21/2023 8:41 AM  Keith [name]  has presented today for surgery, with the diagnosis of GI Bleed.  The various methods of treatment have been discussed with the patient and family. After consideration of risks, benefits and other options for treatment, the patient has consented to  Procedure(s): COLONOSCOPY WITH PROPOFOL (N/A) as a surgical intervention.  The patient's history has been reviewed, patient examined, no change in status, stable for surgery.  I have reviewed the patient's chart and labs.  Questions were answered to the patient's satisfaction.     Keith [name]

## 2023-09-22 DIAGNOSIS — D62 Acute posthemorrhagic anemia: Secondary | ICD-10-CM | POA: Diagnosis not present

## 2023-09-22 MED ORDER — SIMETHICONE 80 MG PO CHEW
80.0000 mg | CHEWABLE_TABLET | Freq: Four times a day (QID) | ORAL | Status: DC | PRN
  Administered 2023-09-22: 80 mg via ORAL
  Filled 2023-09-22: qty 1

## 2023-09-22 NOTE — Progress Notes (Signed)
   09/22/23 2341  BiPAP/CPAP/SIPAP  Reason BIPAP/CPAP not in use Non-compliant   Pt refused CPAP for nighttime use.

## 2023-09-22 NOTE — Progress Notes (Signed)
PROGRESS NOTE    BUZZY DETORO  ZOX:[id number] DOB: 07-Nov-1950 DOA: 09/18/2023 PCP: [place], [name], MD    Brief history: Patient is a 72 year old male with past medical history significant for saddle PE/DVT, cardiac arrest, requiring tPA in 2021, recurrent PE in 2023 after stopping DOAC, chronic diastolic CHF, RV failure, pulmonary hypertension, type 2 diabetes mellitus, spinal stenosis, and multiple back surgeries.  Patient is wheelchair-bound.  Patient was sent to the ED from cardiology clinic with worsening anemia, progressive dyspnea on exertion.  Patient had been on naproxen for a long time.  On presentation, hemoglobin was 6.9, with O2 sat of 87%.  Stool was heme positive.  Patient has been transfused 1 unit of packed red blood cells.  On 09/20/2023, patient underwent EGD that revealed normal esophagus and erythematous stomach.  GI plans to proceed with colonoscopy tomorrow.  On 09/21/2023, patient underwent colonoscopy.  Colonoscopy revealed 2 colonic polyps (descending colon and cecum) that were removed, sigmoid colon diverticulosis, localized inflammation involving the distal rectum that was biopsied, nonbleeding external and internal hemorrhoids, normal ileum and no blood noted throughout the GI lumen.  GI team recommended hydrocortisone rectally, resumption of Eliquis in 2 days time (09/23/2023), and close monitoring of CBC.  Patient is awaiting skilled nursing facility placement.  Pursue disposition.    Subjective: No new complaints today.   No further bleeding reported.  Assessment and Plan:  Acute GI blood loss anemia  -Hemoglobin 3-4gm lower than baseline -Transfused 1 unit PRBC. -EGD revealed erythematous stomach.   -Colonoscopy revealed colonic polyps and inflamed rectum.   -Significant history of NSAID use. -GI input is appreciated. -Eliquis will be resumed on 09/23/2023 according to GI team.   -Continue PPI.   Acute on chronic HFpEF Acute chronic RV  failure/pulmonary hypertension -Last echo 8/24 with EF 70-75%, mild LVH, grade 1 DD, preserved RV  -considerably volume overloaded, likely worsened by anemia, but improving.  Low albumin is also noted. -Continue IV Lasix 80 Mg twice daily, Aldactone -Continue Adempas/macitentan Strict weights/intake output/daily labs.   Acute hypoxic respiratory failure -Likely multifactorial (symptomatic anemia, HFpEF exacerbation, OSA) -Continue CPAP.    History of recurrent VTE (saddle PE requiring tPA/vasopressor support in 2021-subsequent recurrent PE 2023 while off Eliquis) -Eliquis is on hold.   -GI to advise when to restart. -EGD done today revealed erythematous stomach. -For colonoscopy tomorrow. 09/21/2023: Resume Eliquis in 48 hours (09/22/2024).  HTN Continue amlodipine/Coreg   Hyperkalemia -Resolved  CKD stage IIIb Close to baseline Follow electrolytes closely   History of cord compression/spinal stenosis-s/p multiple back surgeries Mostly bed to wheelchair bound PT/OT eval   Chronic pain syndrome Resume usual dosing of narcotics along with Neurontin and Cymbalta -bowel regimen with MiraLAX/senna   ?  Seizure disorder Keppra   Sacral decubitus ulcer Present on admission, per wound care   OSA CPAP   DVT prophylaxis: Code Status:  Family Communication: Disposition Plan:   Consultants:  GI.  Procedures:  -EGD. -Colonoscopy.   Antimicrobials:    Objective: Vitals:   09/22/23 0106 09/22/23 0419 09/22/23 0424 09/22/23 0754  BP: 94/61 104/61  105/62  Pulse: (!) 58 86  89  Resp: 17 18  16   Temp: 98.9 F (37.2 C) 98.9 F (37.2 C)  99 F (37.2 C)  TempSrc: Oral Oral  Oral  SpO2: 92% 90%  93%  Weight:   103 kg   Height:        Intake/Output Summary (Last 24 hours) at 09/22/2023 1417 Last data  filed at 09/22/2023 0424 Gross per 24 hour  Intake --  Output 1350 ml  Net -1350 ml   Filed Weights   09/20/23 1753 09/21/23 0355 09/22/23 0424  Weight:  99.8 kg 102.4 kg 103 kg    Examination:  General exam: Obese.  Not in any distress.  Awake and alert.  Patient has deformed left foot (from parachute injury) HEENT: Patient is pale. Neck: Supple. CVS: S1-S2, regular rhythm Lungs: Basilar Rales Abdomen: Soft and nontender. Extremities: Edema.     Data Reviewed:   CBC: Recent Labs  Lab 09/18/23 1116 09/18/23 1507 09/19/23 0917 09/20/23 0743 09/21/23 0614  WBC 7.3 7.3 5.3 5.6 6.9  NEUTROABS  --   --   --   --  5.3  HGB 6.6* 6.9* 7.3* 8.1* 8.6*  HCT 23.9* 24.9* 25.0* 26.8* 29.2*  MCV 83.6 84.4 80.6 79.8* 81.1  PLT 413* 416* 358 379 389   Basic Metabolic Panel: Recent Labs  Lab 09/18/23 1116 09/18/23 1507 09/19/23 0917 09/20/23 0743 09/21/23 1032  NA 140 148* 142 142 137  K 4.3 5.9* 3.6 3.5 3.4*  CL 113* 112* 108 106 100  CO2 22 22 23 26 28   GLUCOSE 122* 119* 139* 97 84  BUN 27* 25* 20 19 12   CREATININE 1.37* 1.40* 1.26* 1.24 0.96  CALCIUM 9.8 10.5* 9.8 9.8 9.5  PHOS  --   --   --   --  3.3   GFR: Estimated Creatinine Clearance: 84.9 mL/min (by C-G formula based on SCr of 0.96 mg/dL). Liver Function Tests: Recent Labs  Lab 09/18/23 1507 09/20/23 0743 09/21/23 1032  AST 39 17  --   ALT 14 14  --   ALKPHOS 68 57  --   BILITOT 0.7 0.8  --   PROT 7.2 6.6  --   ALBUMIN 3.3* 3.0* 3.0*   No results for input(s): "LIPASE", "AMYLASE" in the last 168 hours. No results for input(s): "AMMONIA" in the last 168 hours. Coagulation Profile: No results for input(s): "INR", "PROTIME" in the last 168 hours. Cardiac Enzymes: No results for input(s): "CKTOTAL", "CKMB", "CKMBINDEX", "TROPONINI" in the last 168 hours. BNP (last 3 results) No results for input(s): "PROBNP" in the last 8760 hours. HbA1C: No results for input(s): "HGBA1C" in the last 72 hours. CBG: Recent Labs  Lab 09/21/23 0937  GLUCAP 83   Lipid Profile: No results for input(s): "CHOL", "HDL", "LDLCALC", "TRIG", "CHOLHDL", "LDLDIRECT" in the last  72 hours. Thyroid Function Tests: No results for input(s): "TSH", "T4TOTAL", "FREET4", "T3FREE", "THYROIDAB" in the last 72 hours. Anemia Panel: No results for input(s): "VITAMINB12", "FOLATE", "FERRITIN", "TIBC", "IRON", "RETICCTPCT" in the last 72 hours.  Urine analysis:    Component Value Date/Time   COLORURINE YELLOW 03/27/2022 2016   APPEARANCEUR CLOUDY (A) 03/27/2022 2016   LABSPEC 1.020 03/27/2022 2016   PHURINE 8.0 03/27/2022 2016   GLUCOSEU NEGATIVE 03/27/2022 2016   HGBUR NEGATIVE 03/27/2022 2016   HGBUR negative 10/26/2010 0824   BILIRUBINUR NEGATIVE 03/27/2022 2016   BILIRUBINUR n 01/12/2019 1020   KETONESUR NEGATIVE 03/27/2022 2016   PROTEINUR NEGATIVE 03/27/2022 2016   UROBILINOGEN 0.2 01/12/2019 1020   UROBILINOGEN 0.2 04/10/2013 1555   NITRITE POSITIVE (A) 03/27/2022 2016   LEUKOCYTESUR NEGATIVE 03/27/2022 2016   Sepsis Labs: @LABRCNTIP (procalcitonin:4,lacticidven:4)  ) Recent Results (from the past 240 hours)  Resp panel by RT-PCR (RSV, Flu A&B, Covid) Anterior Nasal Swab     Status: None   Collection Time: 09/18/23  3:07 PM  Specimen: Anterior Nasal Swab  Result Value Ref Range Status   SARS Coronavirus 2 by RT PCR NEGATIVE NEGATIVE Final   Influenza A by PCR NEGATIVE NEGATIVE Final   Influenza B by PCR NEGATIVE NEGATIVE Final    Comment: (NOTE) The Xpert Xpress SARS-CoV-2/FLU/RSV plus assay is intended as an aid in the diagnosis of influenza from Nasopharyngeal swab specimens and should not be used as a sole basis for treatment. Nasal washings and aspirates are unacceptable for Xpert Xpress SARS-CoV-2/FLU/RSV testing.  Fact Sheet for Patients: BloggerCourse.com  Fact Sheet for Healthcare Providers: SeriousBroker.it  This test is not yet approved or cleared by the [place] FDA and has been authorized for detection and/or diagnosis of SARS-CoV-2 by FDA under an Emergency Use Authorization  (EUA). This EUA will remain in effect (meaning this test can be used) for the duration of the COVID-19 declaration under Section 564(b)(1) of the Act, 21 U.S.C. section 360bbb-3(b)(1), unless the authorization is terminated or revoked.     Resp Syncytial Virus by PCR NEGATIVE NEGATIVE Final    Comment: (NOTE) Fact Sheet for Patients: BloggerCourse.com  Fact Sheet for Healthcare Providers: SeriousBroker.it  This test is not yet approved or cleared by the [place] FDA and has been authorized for detection and/or diagnosis of SARS-CoV-2 by FDA under an Emergency Use Authorization (EUA). This EUA will remain in effect (meaning this test can be used) for the duration of the COVID-19 declaration under Section 564(b)(1) of the Act, 21 U.S.C. section 360bbb-3(b)(1), unless the authorization is terminated or revoked.  Performed at [facility], [address] [place], [place] [address]      Radiology Studies: No results found.    Scheduled Meds:  vitamin C  250 mg Oral Daily   carvedilol  12.5 mg Oral BID WC   cholecalciferol  1,000 Units Oral Daily   DULoxetine  30 mg Oral Daily   furosemide  80 mg Intravenous BID   gabapentin  800 mg Oral TID   HYDROcodone-acetaminophen  1 tablet Oral QID   hydrocortisone  25 mg Rectal QHS   levETIRAcetam  250 mg Oral BID   macitentan  10 mg Oral Daily   pantoprazole  40 mg Oral BID   polyethylene glycol  17 g Oral BID   Riociguat  2 mg Oral TID   rOPINIRole  1 mg Oral QHS   rosuvastatin  20 mg Oral Daily   senna-docusate  2 tablet Oral QHS   spironolactone  25 mg Oral Daily   Continuous Infusions:   LOS: 4 days    Time spent:    Barnetta Chapel, MD [facility]   09/22/2023, 2:17 PM

## 2023-09-22 NOTE — Anesthesia Postprocedure Evaluation (Signed)
Anesthesia Post Note  Patient: ECHO TOPP  Procedure(s) Performed: ESOPHAGOGASTRODUODENOSCOPY (EGD) WITH PROPOFOL BIOPSY     Patient location during evaluation: Endoscopy Anesthesia Type: MAC Level of consciousness: awake and alert Pain management: pain level controlled Vital Signs Assessment: post-procedure vital signs reviewed and stable Respiratory status: spontaneous breathing, nonlabored ventilation, respiratory function stable and patient connected to nasal cannula oxygen Cardiovascular status: blood pressure returned to baseline and stable Postop Assessment: no apparent nausea or vomiting Anesthetic complications: no   No notable events documented.  Last Vitals:  Vitals:   09/22/23 0754 09/22/23 1542  BP: 105/62 99/62  Pulse: 89 86  Resp: 16 16  Temp: 37.2 C 37.4 C  SpO2: 93% 94%    Last Pain:  Vitals:   09/22/23 1542  TempSrc: Oral  PainSc:                  Kennetta Pavlovic L Mallory Enriques

## 2023-09-22 NOTE — Plan of Care (Signed)

## 2023-09-23 ENCOUNTER — Other Ambulatory Visit (HOSPITAL_COMMUNITY): Payer: Self-pay

## 2023-09-23 ENCOUNTER — Other Ambulatory Visit (HOSPITAL_BASED_OUTPATIENT_CLINIC_OR_DEPARTMENT_OTHER): Payer: Self-pay

## 2023-09-23 ENCOUNTER — Encounter (HOSPITAL_COMMUNITY): Payer: Self-pay | Admitting: Gastroenterology

## 2023-09-23 ENCOUNTER — Telehealth: Payer: Self-pay | Admitting: *Deleted

## 2023-09-23 DIAGNOSIS — K5730 Diverticulosis of large intestine without perforation or abscess without bleeding: Secondary | ICD-10-CM

## 2023-09-23 DIAGNOSIS — I10 Essential (primary) hypertension: Secondary | ICD-10-CM

## 2023-09-23 DIAGNOSIS — R195 Other fecal abnormalities: Secondary | ICD-10-CM | POA: Diagnosis not present

## 2023-09-23 DIAGNOSIS — D649 Anemia, unspecified: Secondary | ICD-10-CM | POA: Diagnosis not present

## 2023-09-23 DIAGNOSIS — J9601 Acute respiratory failure with hypoxia: Secondary | ICD-10-CM | POA: Diagnosis not present

## 2023-09-23 LAB — MAGNESIUM: Magnesium: 1.7 mg/dL (ref 1.7–2.4)

## 2023-09-23 LAB — SURGICAL PATHOLOGY

## 2023-09-23 LAB — CBC WITH DIFFERENTIAL/PLATELET
Abs Immature Granulocytes: 0.02 10*3/uL (ref 0.00–0.07)
Basophils Absolute: 0 10*3/uL (ref 0.0–0.1)
Basophils Relative: 0 %
Eosinophils Absolute: 0.2 10*3/uL (ref 0.0–0.5)
Eosinophils Relative: 4 %
HCT: 29.1 % — ABNORMAL LOW (ref 39.0–52.0)
Hemoglobin: 8.5 g/dL — ABNORMAL LOW (ref 13.0–17.0)
Immature Granulocytes: 0 %
Lymphocytes Relative: 9 %
Lymphs Abs: 0.4 10*3/uL — ABNORMAL LOW (ref 0.7–4.0)
MCH: 23.5 pg — ABNORMAL LOW (ref 26.0–34.0)
MCHC: 29.2 g/dL — ABNORMAL LOW (ref 30.0–36.0)
MCV: 80.4 fL (ref 80.0–100.0)
Monocytes Absolute: 1 10*3/uL (ref 0.1–1.0)
Monocytes Relative: 21 %
Neutro Abs: 3.3 10*3/uL (ref 1.7–7.7)
Neutrophils Relative %: 66 %
Platelets: 347 10*3/uL (ref 150–400)
RBC: 3.62 MIL/uL — ABNORMAL LOW (ref 4.22–5.81)
RDW: 16.9 % — ABNORMAL HIGH (ref 11.5–15.5)
WBC: 4.9 10*3/uL (ref 4.0–10.5)
nRBC: 0 % (ref 0.0–0.2)

## 2023-09-23 LAB — RENAL FUNCTION PANEL
Albumin: 3 g/dL — ABNORMAL LOW (ref 3.5–5.0)
Anion gap: 7 (ref 5–15)
BUN: 16 mg/dL (ref 8–23)
CO2: 30 mmol/L (ref 22–32)
Calcium: 9.7 mg/dL (ref 8.9–10.3)
Chloride: 98 mmol/L (ref 98–111)
Creatinine, Ser: 1.27 mg/dL — ABNORMAL HIGH (ref 0.61–1.24)
GFR, Estimated: >60 mL/min (ref >60)
Glucose, Bld: 107 mg/dL — ABNORMAL HIGH (ref 70–99)
Phosphorus: 3.3 mg/dL (ref 2.5–4.6)
Potassium: 3.3 mmol/L — ABNORMAL LOW (ref 3.5–5.1)
Sodium: 135 mmol/L (ref 135–145)

## 2023-09-23 MED ORDER — APIXABAN 5 MG PO TABS
5.0000 mg | ORAL_TABLET | Freq: Two times a day (BID) | ORAL | Status: DC
  Administered 2023-09-23 – 2023-09-25 (×4): 5 mg via ORAL
  Filled 2023-09-23 (×4): qty 1

## 2023-09-23 MED ORDER — POTASSIUM CHLORIDE CRYS ER 20 MEQ PO TBCR
40.0000 meq | EXTENDED_RELEASE_TABLET | Freq: Once | ORAL | Status: AC
  Administered 2023-09-23: 40 meq via ORAL
  Filled 2023-09-23: qty 2

## 2023-09-23 NOTE — Care Management Important Message (Signed)
Important Message  Patient Details  Name: [name] MRN: [id number] Date of Birth: Nov 07, 1950   Important Message Given:  Yes - Medicare IM     Dorena Bodo 09/23/2023, 3:22 PM

## 2023-09-23 NOTE — Telephone Encounter (Signed)
Patient has tentitively been scheduled to see Hyacinth Meeker, PA-C on 10/24/23 at 1030 am.  Patient currently inpatient. Will discuss upcoming appointment following discharge.

## 2023-09-23 NOTE — Hospital Course (Addendum)
Keith [name] is a 72 y.o. male with a history of saddle PE/DVT, cardiac arrest, recurrent PE, diastolic heart failure, RV failure, pulmonary hypertension, diabetes mellitus type 2, spinal stenosis.  Patient presented secondary to dyspnea on exertion in setting of anemia and found to have concern for GI bleeding. Stool was heme positive.  On 09/21/2023, patient underwent colonoscopy.  Colonoscopy revealed 2 colonic polyps (descending colon and cecum) that were removed, sigmoid colon diverticulosis, localized inflammation involving the distal rectum that was biopsied, nonbleeding external and internal hemorrhoids, normal ileum and no blood noted throughout the GI lumen.  GI team recommended hydrocortisone rectally, resumption of Eliquis in 2 days time (09/23/2023), and close monitoring of CBC.  Patient is awaiting skilled nursing facility placement.

## 2023-09-23 NOTE — Progress Notes (Signed)
Physical Therapy Treatment Patient Details Name: Keith [name] MRN: [id number] DOB: May 03, 1951 Today's Date: 09/23/2023   History of Present Illness 72 y.o. male admitted 12/11 with shortness of breath; Severe symptomatic anemia-concern for slow GI bleeding/chronic blood loss, Acute on chronic HFpEF, and Acute hypoxic respiratory failure.  S/p EGD 12/13. Medical history significant of saddle PE/DVT requiring tPA in 2021-recurrent PE in 2023 after stopping DOAC, chronic HFpEF with RV failure, DM-2, HTN, spinal stenosis requiring numerous back surgeries-mostly wheelchair-bound    PT Comments  Continues to work hard during therapy sessions. Improving independence with bed mobility (goals updated.) Still needs up to max assist to rise from elevated surface with Stedy for transition to standing. Performed multiple times from perched position on Stedy paddles with min-mod assist. Tolerates ea stand 10 sec or less but shows improved upright stand and muscle engagement. Reviewed additional exercises. Requested secretary order lift pad with clips, and geopad for OOB to recliner with nursing staff. Pt educated on weight shifting in chair and limiting time in chair to about 1 hour at a time due to pressure sore, but attempt to get OOB 2-3 times per day as tolerated with nursing. Patient will benefit from continued inpatient follow up therapy, <3 hours/day. Patient will continue to benefit from skilled physical therapy services to further improve independence with functional mobility.     If plan is discharge home, recommend the following: Two people to help with walking and/or transfers;Assistance with cooking/housework;A lot of help with bathing/dressing/bathroom;Assist for transportation;Help with stairs or ramp for entrance   Can travel by private vehicle     No  Equipment Recommendations  None recommended by PT    Recommendations for Other Services       Precautions / Restrictions  Precautions Precautions: Fall Restrictions Weight Bearing Restrictions Per Provider Order: No     Mobility  Bed Mobility Overal bed mobility: Needs Assistance Bed Mobility: Supine to Sit, Sit to Supine     Supine to sit: HOB elevated, Used rails, Contact guard Sit to supine: Min assist   General bed mobility comments: CGA with sheets for pt to pull LEs out of bed, able to rise without assist today. Min assist for LE support back into bed.    Transfers Overall transfer level: Needs assistance Equipment used: Ambulation equipment used Transfers: Sit to/from Stand Sit to Stand: Max assist, From elevated surface, Via lift equipment           General transfer comment: Used Stedy to work on sit<>stand transitions and LE strengthening due to profound LE weakness. Max assist to rise from elevated bed surface. Good UE strength to pull forward and press up with cues to adduct shoulders for improved leverage. Once in perched position on stedy, pt able to stand 3 more times with min-mod assist each. VC for upright posture, hips forward and chest upright. Ea stand tolerated up to 10 seconds. LEs and UEs fatigue rapidly. Transfer via Lift Equipment: Stedy  Ambulation/Gait               General Gait Details: Theme park manager Bed    Modified Rankin (Stroke Patients Only)       Balance Overall balance assessment: Needs assistance Sitting-balance support: Feet supported, No upper extremity supported Sitting balance-Leahy Scale: Fair     Standing balance support: Bilateral upper extremity supported, Reliant on assistive device for balance  Standing balance-Leahy Scale: Zero                              Cognition Arousal: Alert Behavior During Therapy: WFL for tasks assessed/performed Overall Cognitive Status: Within Functional Limits for tasks assessed                                           Exercises General Exercises - Lower Extremity Ankle Circles/Pumps: Strengthening, Both, Supine, 5 reps Quad Sets: Strengthening, Both, Supine, 5 reps Gluteal Sets: Strengthening, Both, Supine, 5 reps Long Arc Quad: Strengthening, Both, 10 reps, Seated Hip ABduction/ADduction: Strengthening, 10 reps, Seated    General Comments General comments (skin integrity, edema, etc.): Requested secretary order lift pad with clips, and geopad for OOB to recliner with nursing staff. Pt educated on weight shifting in chair and limiting time in chair to about 1 hour at a time but attempt to get OOB 2-3 times per day as tolerated.      Pertinent Vitals/Pain Pain Assessment Pain Assessment: Faces Faces Pain Scale: Hurts a little bit Pain Location: back Pain Descriptors / Indicators: Aching Pain Intervention(s): Monitored during session, Repositioned    Home Living                          Prior Function            PT Goals (current goals can now be found in the care plan section) Acute Rehab PT Goals Patient Stated Goal: Get his strength back PT Goal Formulation: With patient Time For Goal Achievement: 10/03/23 Potential to Achieve Goals: Good Progress towards PT goals: Progressing toward goals    Frequency    Min 1X/week      PT Plan      Co-evaluation              AM-PAC PT "6 Clicks" Mobility   Outcome Measure  Help needed turning from your back to your side while in a flat bed without using bedrails?: A Little Help needed moving from lying on your back to sitting on the side of a flat bed without using bedrails?: A Little Help needed moving to and from a bed to a chair (including a wheelchair)?: Total Help needed standing up from a chair using your arms (e.g., wheelchair or bedside chair)?: Total Help needed to walk in hospital room?: Total Help needed climbing 3-5 steps with a railing? : Total 6 Click Score: 10    End of Session   Activity Tolerance:  Patient tolerated treatment well (Weakness) Patient left: in bed;with call bell/phone within reach;with bed alarm set   PT Visit Diagnosis: Unsteadiness on feet (R26.81);Muscle weakness (generalized) (M62.81);Difficulty in walking, not elsewhere classified (R26.2);Pain Pain - part of body:  (back; chronic)     Time: 1610-9604 PT Time Calculation (min) (ACUTE ONLY): 30 min  Charges:    $Therapeutic Exercise: 8-22 mins $Therapeutic Activity: 8-22 mins PT General Charges $$ ACUTE PT VISIT: 1 Visit                     Kathlyn Sacramento, PT, DPT [facility]  Rehabilitation Services Physical Therapist Office: [phone] Website: [email or web address]    Berton Mount 09/23/2023, 11:53 AM

## 2023-09-23 NOTE — Telephone Encounter (Signed)
-----   Message from Shellia Cleverly sent at 09/21/2023  2:23 PM EST ----- I expect patient will be discharged sometime over the weekend or early next week.  Likely going to SNF.  Can you please arrange for the following:  - Repeat CBC check 7-10 days after hospital discharge - Can schedule follow-up with Dr. Myrtie Neither or Victorino Dike in the next 4-6 weeks

## 2023-09-23 NOTE — Plan of Care (Signed)

## 2023-09-23 NOTE — TOC Progression Note (Signed)
Transition of Care [facility]) - Progression Note    Patient Details  Name: Keith [name] MRN: [id number] Date of Birth: 1950/12/12  Transition of Care [facility]) CM/SW Contact  Sahana Boyland A [place], [place] Phone Number: 09/23/2023, 2:50 PM  Clinical Narrative:     CSW met with pt and at bedside to provide bed offers. They stated they would discuss and make a decision on a facility and let CSW know. Pt updated CSW and selected [facility] for SNF. CSW to  start authorization as pt is nearing medical stability for DC.   TOC will continue to follow.         Expected Discharge Plan and Services         Expected Discharge Date: 09/21/23                                     Social Determinants of Health (SDOH) Interventions SDOH Screenings   Food Insecurity: No Food Insecurity (09/18/2023)  Housing: Low Risk  (09/18/2023)  Transportation Needs: No Transportation Needs (09/18/2023)  Utilities: Not At Risk (09/18/2023)  Alcohol Screen: Low Risk  (03/16/2023)  Depression (PHQ2-9): Low Risk  (04/04/2023)  Financial Resource Strain: Low Risk  (03/16/2023)  Physical Activity: Insufficiently Active (03/16/2023)  Social Connections: Moderately Integrated (03/16/2023)  Stress: No Stress Concern Present (03/16/2023)  Tobacco Use: Medium Risk (09/21/2023)    Readmission Risk Interventions    10/24/2022   12:05 PM 03/06/2022    9:59 AM  Readmission Risk Prevention Plan  Transportation Screening Complete Complete  PCP or Specialist Appt within 5-7 Days Complete   PCP or Specialist Appt within 3-5 Days  Complete  Home Care Screening Complete   Medication Review (RN CM) Complete   HRI or Home Care Consult  Complete  Social Work Consult for Recovery Care Planning/Counseling  Complete  Palliative Care Screening  Not Applicable  Medication Review Oceanographer)  Referral to Pharmacy

## 2023-09-23 NOTE — Progress Notes (Signed)
PROGRESS NOTE    Keith [name]  ZOX:[id number] DOB: [birth year] DOA: 09/18/2023 PCP: [place], [name], MD   Brief Narrative: Keith [name] is a 72 y.o. male with a history of saddle PE/DVT, cardiac arrest, recurrent PE, diastolic heart failure, RV failure, pulmonary hypertension, diabetes mellitus type 2, spinal stenosis.  Patient presented secondary to dyspnea on exertion in setting of anemia and found to have concern for GI bleeding. Stool was heme positive.  On 09/21/2023, patient underwent colonoscopy.  Colonoscopy revealed 2 colonic polyps (descending colon and cecum) that were removed, sigmoid colon diverticulosis, localized inflammation involving the distal rectum that was biopsied, nonbleeding external and internal hemorrhoids, normal ileum and no blood noted throughout the GI lumen.  GI team recommended hydrocortisone rectally, resumption of Eliquis in 2 days time (09/23/2023), and close monitoring of CBC.  Patient is awaiting skilled nursing facility placement.   Assessment and Plan:  GI bleeding Patient with evidence of symptomatic anemia with heme positive stool in setting of Eliquis and NSAID use. Gastroenterology consulted and performed an upper GI endoscopy and colonoscopy. Upper endoscopy revealed erythematous mucosa; biopsy (12/13) obtained and colonoscopy significant for two 3-4 mm polyps (resected and retrieved) in addition to diverticulosis, hemorrhoids and localized inflammation int he distal rectum, which was biopsied (12/14). Gi recommendation for hydrocortisone suppository 25 mg per rectum daily for one week and to restart Eliquis on 12/16. GI recommendation for outpatient follow-up.   Acute blood loss anemia Acute on chronic anemia Acute anemia secondary to GI bleeding. Baseline hemoglobin of around 10-11 g/dL. Hemoglobin of 6.6 g/dL on admission. Patient has required a total of 2 units of PRBCs via transfusion this admission. Hemoglobin stable.  Acute on chronic  heart failure with preserved EF Acute on chronic RV failure Pulmonary hypertension Patient managed with Lasix IV. Fluid status improved. Creatinine trended up slightly. -Hold Lasix.  Acute respiratory failure with hypoxia Considered multifactorial. Weaned to room air. Resolved.  History of recurrent PE Patient is managed on Eliquis as an outpatient which was held secondary to GI bleeding and subsequent acute anemia.  Primary hypertension -Continue Coreg  Hyperkalemia Treated with Lokelma. Resolved.  Hypokalemia Potassium supplementation as needed.  CKD stage IIIb Stable.  History of cord compression/spinal stenosis Patient is s/p multiple back surgeries and is currently mostly bed to wheelchair bound.  Seizure disorder -Continue Keppra  Pressure injury Sacrum and right buttocks, present on admission.    DVT prophylaxis: Eliquis Code Status:   Code Status: Full Code Family Communication: None at bedside Disposition Plan: Discharge to SNF likely in 24 hours   Consultants:  Gastroenterology  Procedures:  Upper GI endoscopy Colonoscopy  Antimicrobials: None    Subjective: Patient looking forward to discharge to rehab. No bowel movement since bowel prep. No specific concerns today.  Objective: BP 117/70 (BP Location: Left Arm)   Pulse 77   Temp 97.8 F (36.6 C) (Oral)   Resp 19   Ht 5\' 10"  (1.778 m)   Wt 102.1 kg   SpO2 97%   BMI 32.30 kg/m   Examination:  General exam: Appears calm and comfortable Respiratory system: Clear to auscultation. Respiratory effort normal. Cardiovascular system: S1 & S2 heard, RRR. Gastrointestinal system: Abdomen is nondistended, soft and nontender. Normal bowel sounds heard. Central nervous system: Alert and oriented. No focal neurological deficits. Psychiatry: Judgement and insight appear normal. Mood & affect appropriate.    Data Reviewed: I have personally reviewed following labs and imaging studies  CBC Lab  Results  Component Value Date   WBC 4.9 09/23/2023   RBC 3.62 (L) 09/23/2023   HGB 8.5 (L) 09/23/2023   HCT 29.1 (L) 09/23/2023   MCV 80.4 09/23/2023   MCH 23.5 (L) 09/23/2023   PLT 347 09/23/2023   MCHC 29.2 (L) 09/23/2023   RDW 16.9 (H) 09/23/2023   LYMPHSABS 0.4 (L) 09/23/2023   MONOABS 1.0 09/23/2023   EOSABS 0.2 09/23/2023   BASOSABS 0.0 09/23/2023     Last metabolic panel Lab Results  Component Value Date   NA 135 09/23/2023   K 3.3 (L) 09/23/2023   CL 98 09/23/2023   CO2 30 09/23/2023   BUN 16 09/23/2023   CREATININE 1.27 (H) 09/23/2023   GLUCOSE 107 (H) 09/23/2023   GFRNONAA >60 09/23/2023   GFRAA >60 07/11/2020   CALCIUM 9.7 09/23/2023   PHOS 3.3 09/23/2023   PROT 6.6 09/20/2023   ALBUMIN 3.0 (L) 09/23/2023   BILITOT 0.8 09/20/2023   ALKPHOS 57 09/20/2023   AST 17 09/20/2023   ALT 14 09/20/2023   ANIONGAP 7 09/23/2023    GFR: Estimated Creatinine Clearance: 63.8 mL/min (A) (by C-G formula based on SCr of 1.27 mg/dL (H)).  Recent Results (from the past 240 hours)  Resp panel by RT-PCR (RSV, Flu A&B, Covid) Anterior Nasal Swab     Status: None   Collection Time: 09/18/23  3:07 PM   Specimen: Anterior Nasal Swab  Result Value Ref Range Status   SARS Coronavirus 2 by RT PCR NEGATIVE NEGATIVE Final   Influenza A by PCR NEGATIVE NEGATIVE Final   Influenza B by PCR NEGATIVE NEGATIVE Final    Comment: (NOTE) The Xpert Xpress SARS-CoV-2/FLU/RSV plus assay is intended as an aid in the diagnosis of influenza from Nasopharyngeal swab specimens and should not be used as a sole basis for treatment. Nasal washings and aspirates are unacceptable for Xpert Xpress SARS-CoV-2/FLU/RSV testing.  Fact Sheet for Patients: BloggerCourse.com  Fact Sheet for Healthcare Providers: SeriousBroker.it  This test is not yet approved or cleared by the [place] FDA and has been authorized for detection and/or diagnosis of  SARS-CoV-2 by FDA under an Emergency Use Authorization (EUA). This EUA will remain in effect (meaning this test can be used) for the duration of the COVID-19 declaration under Section 564(b)(1) of the Act, 21 U.S.C. section 360bbb-3(b)(1), unless the authorization is terminated or revoked.     Resp Syncytial Virus by PCR NEGATIVE NEGATIVE Final    Comment: (NOTE) Fact Sheet for Patients: BloggerCourse.com  Fact Sheet for Healthcare Providers: SeriousBroker.it  This test is not yet approved or cleared by the [place] FDA and has been authorized for detection and/or diagnosis of SARS-CoV-2 by FDA under an Emergency Use Authorization (EUA). This EUA will remain in effect (meaning this test can be used) for the duration of the COVID-19 declaration under Section 564(b)(1) of the Act, 21 U.S.C. section 360bbb-3(b)(1), unless the authorization is terminated or revoked.  Performed at [facility], [address] [place], [place] [address]       Radiology Studies: No results found.    LOS: 5 days    Jacquelin Hawking, MD [facility] 09/23/2023, 4:24 PM   If 7PM-7AM, please contact night-coverage [email or web address]

## 2023-09-23 NOTE — Progress Notes (Addendum)
PHARMACY - ANTICOAGULATION CONSULT NOTE  Pharmacy Consult for Apixaban  Indication:  recurrent PE  Allergies  Allergen Reactions   Aspirin Other (See Comments)    Irritates the stomach and the patient developed ulcers, also   Lisinopril Other (See Comments)    Caused a body ache    Patient Measurements: Height: 5\' 10"  (177.8 cm) Weight: 102.1 kg (225 lb 1.4 oz) IBW/kg (Calculated) : 73  Vital Signs: Temp: 97.8 F (36.6 C) (12/16 0751) Temp Source: Oral (12/16 0751) BP: 104/71 (12/16 1638) Pulse Rate: 90 (12/16 1638)  Labs: Recent Labs    09/21/23 0614 09/21/23 1032 09/23/23 0620  HGB 8.6*  --  8.5*  HCT 29.2*  --  29.1*  PLT 389  --  347  CREATININE  --  0.96 1.27*    Estimated Creatinine Clearance: 63.8 mL/min (A) (by C-G formula based on SCr of 1.27 mg/dL (H)).   Medical History: Past Medical History:  Diagnosis Date   Allergy    CHF (congestive heart failure) (HCC)    Chronic back pain    Diabetes mellitus without complication (HCC)    DNR (do not resuscitate) 11/19/2020   Duodenal ulcer hemorrhage 08/29/2014   ED (erectile dysfunction)    Esophageal stricture 08/30/2014   Glucose intolerance (impaired glucose tolerance)    Hiatal hernia 08/30/2014   Hypertension    Hypokalemia 04/11/2013   MRSA carrier 08/30/2014   Obesity    Osteoarthritis    Pneumonia    Pulmonary embolism (HCC) 06/2020   Scoliosis 2016   Spinal stenosis of lumbar region    Urinary tract infection 04/11/2013    Medications:  Medications Prior to Admission  Medication Sig Dispense Refill Last Dose/Taking   acetaminophen (TYLENOL) 325 MG tablet Take 1-2 tablets (325-650 mg total) by mouth every 4 (four) hours as needed for mild pain.   09/17/2023   amLODipine (NORVASC) 10 MG tablet TAKE 1 TABLET BY MOUTH DAILY 100 tablet 2 09/18/2023   Blood Glucose Monitoring Suppl (ACCU-CHEK GUIDE ME) w/Device KIT USE TO TEST BLOOD SUGARS 1-2 TIMES DAILY. 1 kit 0 Taking   carvedilol (COREG)  12.5 MG tablet TAKE 1 TABLET BY MOUTH TWICE  DAILY WITH MEALS 200 tablet 2 09/18/2023   cholecalciferol (VITAMIN D3) 25 MCG (1000 UNIT) tablet Take 1,000 Units by mouth daily.   09/18/2023   DULoxetine (CYMBALTA) 60 MG capsule TAKE 1 CAPSULE BY MOUTH EVERY DAY 90 capsule 1 Unk   ELIQUIS 5 MG TABS tablet TAKE 1 TABLET BY MOUTH TWICE A DAY 60 tablet 11 09/18/2023 at 0600   ferrous sulfate 325 (65 FE) MG tablet TAKE 1 TABLET BY MOUTH EVERY DAY WITH BREAKFAST (Patient taking differently: Take 325 mg by mouth daily as needed.) 90 tablet 1 Unk   gabapentin (NEURONTIN) 800 MG tablet Take 1 tablet (800 mg total) by mouth 3 (three) times daily for neuropathy. 90 tablet 0 09/18/2023   HYDROcodone-acetaminophen (NORCO) 7.5-325 MG tablet Take 1 tablet by mouth in the morning, at noon, in the evening, and at bedtime. 7.5 mg/375 mg   09/18/2023   hydrocortisone (ANUSOL-HC) 2.5 % rectal cream Place 1 application. rectally as needed for hemorrhoids or anal itching.   Unk   levETIRAcetam (KEPPRA) 250 MG tablet TAKE 1 TABLET BY MOUTH TWICE  DAILY 180 tablet 2 09/18/2023   lidocaine (LIDODERM) 5 % Place 1 patch onto the skin daily as needed (pain). Remove & Discard patch within 12 hours or as directed by MD   Unk  macitentan (OPSUMIT) 10 MG tablet Take 10 mg by mouth daily.   09/18/2023   metolazone (ZAROXOLYN) 2.5 MG tablet Take 1 tablet (2.5 mg total) by mouth once for 1 dose. Take 2 potassium tablets with the Metolazone 1 tablet 0 09/18/2023   naloxone (NARCAN) nasal spray 4 mg/0.1 mL Place 1 spray into the nose as needed. 1 each 3 Unk   omeprazole (PRILOSEC) 40 MG capsule TAKE 1 CAPSULE BY MOUTH DAILY 100 capsule 2 09/18/2023   polyethylene glycol (MIRALAX / GLYCOLAX) 17 g packet Take 17 g by mouth daily as needed for mild constipation.   Unk   potassium chloride SA (KLOR-CON M) 20 MEQ tablet Take 1 tablet (20 mEq total) by mouth daily. (Patient taking differently: Take 40 mEq by mouth daily.) 90 tablet 3  09/18/2023   Riociguat (ADEMPAS) 2 MG TABS Take 2 mg by mouth in the morning, at noon, and at bedtime. 90 tablet 11 09/18/2023   rOPINIRole (REQUIP) 1 MG tablet TAKE 1 TABLET BY MOUTH AT  BEDTIME 100 tablet 2 09/17/2023   rosuvastatin (CRESTOR) 20 MG tablet TAKE 1 TABLET BY MOUTH DAILY 100 tablet 2 09/18/2023   spironolactone (ALDACTONE) 25 MG tablet TAKE 1/2 TABLET BY MOUTH EVERY DAY (Patient taking differently: Take 25 mg by mouth daily.) 45 tablet 3 09/18/2023   traZODone (DESYREL) 50 MG tablet TAKE 1/2 TO 1 TABLET BY MOUTH AT BEDTIME AS NEEDED FOR SLEEP 90 tablet 1 09/17/2023   vitamin C (ASCORBIC ACID) 250 MG tablet Take 250 mg by mouth daily.   09/18/2023   [DISCONTINUED] torsemide (DEMADEX) 20 MG tablet Take 3 tablets (60 mg total) by mouth daily. 135 tablet 2 09/18/2023   tirzepatide (MOUNJARO) 2.5 MG/0.5ML Pen Inject 2.5 mg into the skin once a week. (Patient not taking: Reported on 09/18/2023) 2 mL 11 Not Taking    Assessment: 72 years of age male admitted with hematochezia and Hgb 8 status post pRBC on 12/11 (Last dose of Apixaban was 12/11 at 6 AM for history of recurrent PE). EGD performed on 12/14  found hemorrhoids and two 3 to 4 mm polyps in the descending colon which were resected, diverticulitis of sigmoid colon, and localized inflammation in distal rectum that was biopsied. GI recommended resume Apixaban in 2 days at prior dose. Pharmacy was consulted by primary team to resume Apixaban today as it is post-op day 2.   Hgb stable at 8.5. Platelets are within normal limits.   Goal of Therapy:  Monitor platelets by anticoagulation protocol: Yes   Plan:  Restart Apixaban 5mg  po BID. Monitor CBC and for any signs or symptoms of bleeding.   Monitor SCr  Keith [name], PharmD, BCPS, BCCCP Please refer to [facility] for [facility] Pharmacy numbers 09/23/2023,4:57 PM

## 2023-09-24 ENCOUNTER — Encounter (HOSPITAL_COMMUNITY): Payer: Self-pay | Admitting: Gastroenterology

## 2023-09-24 ENCOUNTER — Encounter: Payer: Self-pay | Admitting: Gastroenterology

## 2023-09-24 DIAGNOSIS — D649 Anemia, unspecified: Secondary | ICD-10-CM | POA: Diagnosis not present

## 2023-09-24 DIAGNOSIS — K5730 Diverticulosis of large intestine without perforation or abscess without bleeding: Secondary | ICD-10-CM | POA: Diagnosis not present

## 2023-09-24 DIAGNOSIS — J9601 Acute respiratory failure with hypoxia: Secondary | ICD-10-CM | POA: Diagnosis not present

## 2023-09-24 DIAGNOSIS — R195 Other fecal abnormalities: Secondary | ICD-10-CM | POA: Diagnosis not present

## 2023-09-24 LAB — CBC
HCT: 28.1 % — ABNORMAL LOW (ref 39.0–52.0)
Hemoglobin: 8.3 g/dL — ABNORMAL LOW (ref 13.0–17.0)
MCH: 23.9 pg — ABNORMAL LOW (ref 26.0–34.0)
MCHC: 29.5 g/dL — ABNORMAL LOW (ref 30.0–36.0)
MCV: 81 fL (ref 80.0–100.0)
Platelets: 320 10*3/uL (ref 150–400)
RBC: 3.47 MIL/uL — ABNORMAL LOW (ref 4.22–5.81)
RDW: 17.1 % — ABNORMAL HIGH (ref 11.5–15.5)
WBC: 5.4 10*3/uL (ref 4.0–10.5)
nRBC: 0 % (ref 0.0–0.2)

## 2023-09-24 LAB — BASIC METABOLIC PANEL
Anion gap: 8 (ref 5–15)
BUN: 19 mg/dL (ref 8–23)
CO2: 26 mmol/L (ref 22–32)
Calcium: 9.6 mg/dL (ref 8.9–10.3)
Chloride: 102 mmol/L (ref 98–111)
Creatinine, Ser: 1.06 mg/dL (ref 0.61–1.24)
GFR, Estimated: >60 mL/min (ref >60)
Glucose, Bld: 105 mg/dL — ABNORMAL HIGH (ref 70–99)
Potassium: 3.7 mmol/L (ref 3.5–5.1)
Sodium: 136 mmol/L (ref 135–145)

## 2023-09-24 LAB — SURGICAL PATHOLOGY

## 2023-09-24 NOTE — Discharge Instructions (Signed)
Information on my medicine - ELIQUIS (apixaban)  This medication education was reviewed with me or my healthcare representative as part of my discharge preparation.   Why was Eliquis prescribed for you? Eliquis was prescribed to treat blood clots that may have been found in the veins of your legs (deep vein thrombosis) or in your lungs (pulmonary embolism) and to reduce the risk of them occurring again.  What do You need to know about Eliquis ? 5 mg tablet taken TWICE daily.  Eliquis may be taken with or without food.   Try to take the dose about the same time in the morning and in the evening. If you have difficulty swallowing the tablet whole please discuss with your pharmacist how to take the medication safely.  Take Eliquis exactly as prescribed and DO NOT stop taking Eliquis without talking to the doctor who prescribed the medication.  Stopping may increase your risk of developing a new blood clot.  Refill your prescription before you run out.  After discharge, you should have regular check-up appointments with your healthcare provider that is prescribing your Eliquis.    What do you do if you miss a dose? If a dose of ELIQUIS is not taken at the scheduled time, take it as soon as possible on the same day and twice-daily administration should be resumed. The dose should not be doubled to make up for a missed dose.  Important Safety Information A possible side effect of Eliquis is bleeding. You should call your healthcare provider right away if you experience any of the following: Bleeding from an injury or your nose that does not stop. Unusual colored urine (red or dark brown) or unusual colored stools (red or black). Unusual bruising for unknown reasons. A serious fall or if you hit your head (even if there is no bleeding).  Some medicines may interact with Eliquis and might increase your risk of bleeding or clotting while on Eliquis. To help avoid this, consult your  healthcare provider or pharmacist prior to using any new prescription or non-prescription medications, including herbals, vitamins, non-steroidal anti-inflammatory drugs (NSAIDs) and supplements.  This website has more information on Eliquis (apixaban): http://www.eliquis.com/eliquis/home

## 2023-09-24 NOTE — Progress Notes (Signed)
PROGRESS NOTE    DALESSANDRO SCATENA  VOZ:[id number] DOB: 16-Dec-1950 DOA: 09/18/2023 PCP: [place], [name], MD   Brief Narrative: Keith [name] is a 72 y.o. male with a history of saddle PE/DVT, cardiac arrest, recurrent PE, diastolic heart failure, RV failure, pulmonary hypertension, diabetes mellitus type 2, spinal stenosis.  Patient presented secondary to dyspnea on exertion in setting of anemia and found to have concern for GI bleeding. Stool was heme positive.  On 09/21/2023, patient underwent colonoscopy.  Colonoscopy revealed 2 colonic polyps (descending colon and cecum) that were removed, sigmoid colon diverticulosis, localized inflammation involving the distal rectum that was biopsied, nonbleeding external and internal hemorrhoids, normal ileum and no blood noted throughout the GI lumen.  GI team recommended hydrocortisone rectally, resumption of Eliquis in 2 days time (09/23/2023), and close monitoring of CBC.  Patient is awaiting skilled nursing facility placement.   Assessment and Plan:  GI bleeding Patient with evidence of symptomatic anemia with heme positive stool in setting of Eliquis and NSAID use. Gastroenterology consulted and performed an upper GI endoscopy and colonoscopy. Upper endoscopy revealed erythematous mucosa; biopsy (12/13) obtained and colonoscopy significant for two 3-4 mm polyps (resected and retrieved) in addition to diverticulosis, hemorrhoids and localized inflammation int he distal rectum, which was biopsied (12/14). GI recommendation for hydrocortisone suppository 25 mg per rectum daily for one week and to restart Eliquis on 12/16. GI recommendation for outpatient follow-up.   Acute blood loss anemia Acute on chronic anemia Acute anemia secondary to GI bleeding. Baseline hemoglobin of around 10-11 g/dL. Hemoglobin of 6.6 g/dL on admission. Patient has required a total of 2 units of PRBCs via transfusion this admission. Hemoglobin stable. -Check CBC in  AM  Acute on chronic heart failure with preserved EF Acute on chronic RV failure Pulmonary hypertension Patient managed with Lasix IV. Fluid status improved. Creatinine trended up slightly and Lasix held.  Acute respiratory failure with hypoxia Considered multifactorial. Weaned to room air. Resolved.  History of recurrent PE Patient is managed on Eliquis as an outpatient which was held secondary to GI bleeding and subsequent acute anemia.  Primary hypertension -Continue Coreg  Hyperkalemia Treated with Lokelma. Resolved.  Hypokalemia Potassium supplementation as needed.  CKD stage IIIb Stable.  History of cord compression/spinal stenosis Patient is s/p multiple back surgeries and is currently mostly bed to wheelchair bound.  Seizure disorder -Continue Keppra  Pressure injury Sacrum and right buttocks, present on admission.    DVT prophylaxis: Eliquis Code Status:   Code Status: Full Code Family Communication: None at bedside Disposition Plan: Medically stable for discharge. Awaiting insurance authorization   Consultants:  Gastroenterology  Procedures:  Upper GI endoscopy Colonoscopy  Antimicrobials: None    Subjective: No issues this morning.  Objective: BP 110/65   Pulse 83   Temp 98.7 F (37.1 C) (Oral)   Resp 18   Ht 5\' 10"  (1.778 m)   Wt 102.1 kg   SpO2 90%   BMI 32.30 kg/m   Examination:  General exam: Appears calm and comfortable Respiratory system: Clear to auscultation. Respiratory effort normal. Cardiovascular system: S1 & S2 heard Gastrointestinal system: Abdomen is nondistended, soft and nontender.  Normal bowel sounds heard. Central nervous system: Alert and oriented. No focal neurological deficits. Psychiatry: Judgement and insight appear normal. Mood & affect appropriate.   Data Reviewed: I have personally reviewed following labs and imaging studies  CBC Lab Results  Component Value Date   WBC 5.4 09/24/2023   RBC 3.47  (  L) 09/24/2023   HGB 8.3 (L) 09/24/2023   HCT 28.1 (L) 09/24/2023   MCV 81.0 09/24/2023   MCH 23.9 (L) 09/24/2023   PLT 320 09/24/2023   MCHC 29.5 (L) 09/24/2023   RDW 17.1 (H) 09/24/2023   LYMPHSABS 0.4 (L) 09/23/2023   MONOABS 1.0 09/23/2023   EOSABS 0.2 09/23/2023   BASOSABS 0.0 09/23/2023     Last metabolic panel Lab Results  Component Value Date   NA 136 09/24/2023   K 3.7 09/24/2023   CL 102 09/24/2023   CO2 26 09/24/2023   BUN 19 09/24/2023   CREATININE 1.06 09/24/2023   GLUCOSE 105 (H) 09/24/2023   GFRNONAA >60 09/24/2023   GFRAA >60 07/11/2020   CALCIUM 9.6 09/24/2023   PHOS 3.3 09/23/2023   PROT 6.6 09/20/2023   ALBUMIN 3.0 (L) 09/23/2023   BILITOT 0.8 09/20/2023   ALKPHOS 57 09/20/2023   AST 17 09/20/2023   ALT 14 09/20/2023   ANIONGAP 8 09/24/2023    GFR: Estimated Creatinine Clearance: 76.5 mL/min (by C-G formula based on SCr of 1.06 mg/dL).  Recent Results (from the past 240 hours)  Resp panel by RT-PCR (RSV, Flu A&B, Covid) Anterior Nasal Swab     Status: None   Collection Time: 09/18/23  3:07 PM   Specimen: Anterior Nasal Swab  Result Value Ref Range Status   SARS Coronavirus 2 by RT PCR NEGATIVE NEGATIVE Final   Influenza A by PCR NEGATIVE NEGATIVE Final   Influenza B by PCR NEGATIVE NEGATIVE Final    Comment: (NOTE) The Xpert Xpress SARS-CoV-2/FLU/RSV plus assay is intended as an aid in the diagnosis of influenza from Nasopharyngeal swab specimens and should not be used as a sole basis for treatment. Nasal washings and aspirates are unacceptable for Xpert Xpress SARS-CoV-2/FLU/RSV testing.  Fact Sheet for Patients: BloggerCourse.com  Fact Sheet for Healthcare Providers: SeriousBroker.it  This test is not yet approved or cleared by the [place] FDA and has been authorized for detection and/or diagnosis of SARS-CoV-2 by FDA under an Emergency Use Authorization (EUA). This EUA will  remain in effect (meaning this test can be used) for the duration of the COVID-19 declaration under Section 564(b)(1) of the Act, 21 U.S.C. section 360bbb-3(b)(1), unless the authorization is terminated or revoked.     Resp Syncytial Virus by PCR NEGATIVE NEGATIVE Final    Comment: (NOTE) Fact Sheet for Patients: BloggerCourse.com  Fact Sheet for Healthcare Providers: SeriousBroker.it  This test is not yet approved or cleared by the [place] FDA and has been authorized for detection and/or diagnosis of SARS-CoV-2 by FDA under an Emergency Use Authorization (EUA). This EUA will remain in effect (meaning this test can be used) for the duration of the COVID-19 declaration under Section 564(b)(1) of the Act, 21 U.S.C. section 360bbb-3(b)(1), unless the authorization is terminated or revoked.  Performed at [facility], [address], [place], [place] [address]       Radiology Studies: No results found.    LOS: 6 days    Jacquelin Hawking, MD [facility] 09/24/2023, 10:56 AM   If 7PM-7AM, please contact night-coverage [email or web address]

## 2023-09-24 NOTE — Progress Notes (Signed)
   09/24/23 0001  BiPAP/CPAP/SIPAP  Reason BIPAP/CPAP not in use Non-compliant   Pt refused CPAP for nighttime use.

## 2023-09-24 NOTE — TOC Progression Note (Signed)
Transition of Care [facility]) - Progression Note    Patient Details  Name: RUGER OBERLANDER MRN: [id number] Date of Birth: 04/29/1951  [facility] CM/SW Contact  Melena Hayes A [place], [place] Phone Number: 09/24/2023, 9:23 AM  Clinical Narrative:     [facility] started for insurance. Status pending. Auth ID [id number].   Bed available at facility.    TOC will continue to follow.          Expected Discharge Plan and Services         Expected Discharge Date: 09/21/23                                     Social Determinants of Health (SDOH) Interventions SDOH Screenings   Food Insecurity: No Food Insecurity (09/18/2023)  Housing: Low Risk  (09/18/2023)  Transportation Needs: No Transportation Needs (09/18/2023)  Utilities: Not At Risk (09/18/2023)  Alcohol Screen: Low Risk  (03/16/2023)  Depression (PHQ2-9): Low Risk  (04/04/2023)  Financial Resource Strain: Low Risk  (03/16/2023)  Physical Activity: Insufficiently Active (03/16/2023)  Social Connections: Moderately Integrated (03/16/2023)  Stress: No Stress Concern Present (03/16/2023)  Tobacco Use: Medium Risk (09/21/2023)    Readmission Risk Interventions    10/24/2022   12:05 PM 03/06/2022    9:59 AM  Readmission Risk Prevention Plan  Transportation Screening Complete Complete  PCP or Specialist Appt within 5-7 Days Complete   PCP or Specialist Appt within 3-5 Days  Complete  Home Care Screening Complete   Medication Review (RN CM) Complete   HRI or Home Care Consult  Complete  Social Work Consult for Recovery Care Planning/Counseling  Complete  Palliative Care Screening  Not Applicable  Medication Review Oceanographer)  Referral to Pharmacy

## 2023-09-25 DIAGNOSIS — J9601 Acute respiratory failure with hypoxia: Secondary | ICD-10-CM | POA: Diagnosis not present

## 2023-09-25 DIAGNOSIS — D649 Anemia, unspecified: Secondary | ICD-10-CM | POA: Diagnosis not present

## 2023-09-25 DIAGNOSIS — K5730 Diverticulosis of large intestine without perforation or abscess without bleeding: Secondary | ICD-10-CM | POA: Diagnosis not present

## 2023-09-25 DIAGNOSIS — I10 Essential (primary) hypertension: Secondary | ICD-10-CM | POA: Diagnosis not present

## 2023-09-25 LAB — CBC
HCT: 27.7 % — ABNORMAL LOW (ref 39.0–52.0)
Hemoglobin: 8.2 g/dL — ABNORMAL LOW (ref 13.0–17.0)
MCH: 24.1 pg — ABNORMAL LOW (ref 26.0–34.0)
MCHC: 29.6 g/dL — ABNORMAL LOW (ref 30.0–36.0)
MCV: 81.5 fL (ref 80.0–100.0)
Platelets: 289 10*3/uL (ref 150–400)
RBC: 3.4 MIL/uL — ABNORMAL LOW (ref 4.22–5.81)
RDW: 17 % — ABNORMAL HIGH (ref 11.5–15.5)
WBC: 4.6 10*3/uL (ref 4.0–10.5)
nRBC: 0 % (ref 0.0–0.2)

## 2023-09-25 MED ORDER — HYDROCODONE-ACETAMINOPHEN 7.5-325 MG PO TABS: 1.0000 | ORAL_TABLET | Freq: Four times a day (QID) | ORAL | 0 refills | Status: DC

## 2023-09-25 MED ORDER — HYDROCORTISONE ACETATE 25 MG RE SUPP: 25.0000 mg | Freq: Every day | RECTAL | Status: AC

## 2023-09-25 NOTE — Progress Notes (Signed)
RN was trying to call the report to the SNF. Operator was unable to connect to the nursing staff. Will try later

## 2023-09-25 NOTE — Discharge Summary (Signed)
Physician Discharge Summary   Patient: Keith [name] MRN: [id number] DOB: 03/05/1951  Admit date:     09/18/2023  Discharge date: 09/25/23  Discharge Physician: Jacquelin Hawking, MD   PCP: [place], [name], MD   Recommendations at discharge:  PCP visit for hospital follow-up GI visit for hospital follow-up  Discharge Diagnoses: Active Problems:   Essential hypertension   Spinal stenosis of lumbar region   CKD (chronic kidney disease), stage III (HCC)   Acute respiratory failure with hypoxia (HCC)   Symptomatic anemia   Heme positive stool   Anemia due to chronic blood loss   Iron deficiency anemia   Diverticulosis of colon without hemorrhage   Grade III internal hemorrhoids   Noninfectious gastroenteritis   Cecal polyp   Adenomatous polyp of descending colon   Acute on chronic anemia  Resolved Problems:   * No resolved hospital problems. *  Hospital Course: [name] is a 72 y.o. male with a history of saddle PE/DVT, cardiac arrest, recurrent PE, diastolic heart failure, RV failure, pulmonary hypertension, diabetes mellitus type 2, spinal stenosis.  Patient presented secondary to dyspnea on exertion in setting of anemia and found to have concern for GI bleeding. Stool was heme positive.  On 09/21/2023, patient underwent colonoscopy.  Colonoscopy revealed 2 colonic polyps (descending colon and cecum) that were removed, sigmoid colon diverticulosis, localized inflammation involving the distal rectum that was biopsied, nonbleeding external and internal hemorrhoids, normal ileum and no blood noted throughout the GI lumen.  GI team recommended hydrocortisone rectally for one week and resumption of Eliquis 2 days from procedure. Patient discharged to SNF.  Assessment and Plan:  GI bleeding Patient with evidence of symptomatic anemia with heme positive stool in setting of Eliquis and NSAID use. Gastroenterology consulted and performed an upper GI endoscopy and colonoscopy. Upper  endoscopy revealed erythematous mucosa; biopsy (12/13) obtained and colonoscopy significant for two 3-4 mm polyps (resected and retrieved) in addition to diverticulosis, hemorrhoids and localized inflammation in the distal rectum, which was biopsied (12/14). GI recommendation for hydrocortisone suppository 25 mg per rectum daily for one week and to restart Eliquis on 12/16. Biopsy (12/13) significant for non specific reactive gastropathy without identification of Helicobacter pylori-like organisms. Biopsy (12/14) significant for tubular adenoma of cecum and descending colon polyps in addition to acute inflammation noted on rectum sample. GI outpatient follow-up.    Acute blood loss anemia Acute on chronic anemia Acute anemia secondary to GI bleeding. Baseline hemoglobin of around 10-11 g/dL. Hemoglobin of 6.6 g/dL on admission. Patient has required a total of 2 units of PRBCs via transfusion this admission. Hemoglobin with slight drift downward but mostly stable. No recurrent bloody stools.   Acute on chronic heart failure with preserved EF Acute on chronic RV failure Pulmonary hypertension Patient managed with Lasix IV. Fluid status improved. Creatinine trended up slightly and Lasix held. Resume torsemide on discharge.   Acute respiratory failure with hypoxia Considered multifactorial. Weaned to room air. Resolved.   History of recurrent PE Patient is managed on Eliquis as an outpatient which was held secondary to GI bleeding and subsequent acute anemia.   Primary hypertension Continue Coreg and spironolactone. Amlodipine held on discharge   Hyperkalemia Treated with Lokelma. Resolved. Hold potassium supplementation on discharge. Recheck BMP in 3-5 days.   Hypokalemia Potassium supplementation as needed.   CKD stage IIIb Stable.   History of cord compression/spinal stenosis Patient is s/p multiple back surgeries and is currently mostly bed to wheelchair bound.  Seizure  disorder Continue Keppra   Pressure injury Sacrum and right buttocks, present on admission.   Consultants:  Gastroenterology   Procedures:  Upper GI endoscopy Colonoscop   Disposition: Skilled nursing facility Diet recommendation: Cardiac diet   DISCHARGE MEDICATION: Allergies as of 09/25/2023       Reactions   Aspirin Other (See Comments)   Irritates the stomach and the patient developed ulcers, also   Lisinopril Other (See Comments)   Caused a body ache        Medication List     STOP taking these medications    Accu-Chek Guide Me w/Device Kit   acetaminophen 325 MG tablet Commonly known as: TYLENOL   amLODipine 10 MG tablet Commonly known as: NORVASC   Anusol-HC 2.5 % rectal cream Generic drug: hydrocortisone   metolazone 2.5 MG tablet Commonly known as: ZAROXOLYN   Mounjaro 2.5 MG/0.5ML Pen Generic drug: tirzepatide       TAKE these medications    Adempas 2 MG Tabs Generic drug: Riociguat Take 2 mg by mouth in the morning, at noon, and at bedtime.   carvedilol 12.5 MG tablet Commonly known as: COREG TAKE 1 TABLET BY MOUTH TWICE  DAILY WITH MEALS   cholecalciferol 25 MCG (1000 UNIT) tablet Commonly known as: VITAMIN D3 Take 1,000 Units by mouth daily.   DULoxetine 30 MG capsule Commonly known as: CYMBALTA Take 1 capsule (30 mg total) by mouth daily. What changed:  medication strength how much to take how to take this when to take this additional instructions   Eliquis 5 MG Tabs tablet Generic drug: apixaban TAKE 1 TABLET BY MOUTH TWICE A DAY   ferrous sulfate 325 (65 FE) MG tablet TAKE 1 TABLET BY MOUTH EVERY DAY WITH BREAKFAST What changed: See the new instructions.   gabapentin 800 MG tablet Commonly known as: NEURONTIN Take 1 tablet (800 mg total) by mouth 3 (three) times daily for neuropathy.   HYDROcodone-acetaminophen 7.5-325 MG tablet Commonly known as: NORCO Take 1 tablet by mouth in the morning, at noon, in the  evening, and at bedtime. 7.5 mg/375 mg   hydrocortisone 25 MG suppository Commonly known as: ANUSOL-HC Place 1 suppository (25 mg total) rectally at bedtime for 4 days.   levETIRAcetam 250 MG tablet Commonly known as: KEPPRA TAKE 1 TABLET BY MOUTH TWICE  DAILY   lidocaine 5 % Commonly known as: LIDODERM Place 1 patch onto the skin daily as needed (pain). Remove & Discard patch within 12 hours or as directed by MD   naloxone 4 MG/0.1ML Liqd nasal spray kit Commonly known as: Narcan Place 1 spray into the nose as needed.   omeprazole 40 MG capsule Commonly known as: PRILOSEC TAKE 1 CAPSULE BY MOUTH DAILY   Opsumit 10 MG tablet Generic drug: macitentan Take 10 mg by mouth daily.   polyethylene glycol 17 g packet Commonly known as: MIRALAX / GLYCOLAX Take 17 g by mouth daily as needed for mild constipation.   potassium chloride SA 20 MEQ tablet Commonly known as: KLOR-CON M Take 1 tablet (20 mEq total) by mouth daily. What changed: how much to take   rOPINIRole 1 MG tablet Commonly known as: REQUIP TAKE 1 TABLET BY MOUTH AT  BEDTIME   rosuvastatin 20 MG tablet Commonly known as: CRESTOR TAKE 1 TABLET BY MOUTH DAILY   senna-docusate 8.6-50 MG tablet Commonly known as: Senokot-S Take 2 tablets by mouth at bedtime.   spironolactone 25 MG tablet Commonly known as: ALDACTONE TAKE 1/2  TABLET BY MOUTH EVERY DAY What changed: how much to take   Torsemide 40 MG Tabs Take 60 mg by mouth daily. What changed: medication strength   traZODone 50 MG tablet Commonly known as: DESYREL TAKE 1/2 TO 1 TABLET BY MOUTH AT BEDTIME AS NEEDED FOR SLEEP   vitamin C 250 MG tablet Commonly known as: ASCORBIC ACID Take 250 mg by mouth daily.               Discharge Care Instructions  (From admission, onward)           Start     Ordered   09/21/23 0000  Discharge wound care:       Comments: Continue current wound care regimen.   09/21/23 1248            Contact  information for follow-up providers     [place], [name], MD. Schedule an appointment as soon as possible for a visit in 1 week(s).   Specialty: Family Medicine Contact information: [address] [place] [address] [phone]              Contact information for after-discharge care     Destination     [facility] Preferred SNF .   Service: Skilled Nursing Contact information: [address] [place] [address] [phone]                    Discharge Exam: BP 109/65 (BP Location: Left Arm)   Pulse 74   Temp 98.2 F (36.8 C) (Oral)   Resp 18   Ht 5\' 10"  (1.778 m)   Wt 100.9 kg   SpO2 97%   BMI 31.92 kg/m   General exam: Appears calm and comfortable Respiratory system: Clear to auscultation. Respiratory effort normal. Cardiovascular system: S1 & S2 heard, RRR. Gastrointestinal system: Abdomen is nondistended, soft and nontender. Normal bowel sounds heard. Central nervous system: Alert and oriented. No focal neurological deficits. Musculoskeletal: No edema. No calf tenderness Psychiatry: Judgement and insight appear normal. Mood & affect appropriate.   Condition at discharge: stable  The results of significant diagnostics from this hospitalization (including imaging, microbiology, ancillary and laboratory) are listed below for reference.   Imaging Studies: DG Chest Port 1 View Result Date: 09/18/2023 CLINICAL DATA:  Two-week history of shortness of breath EXAM: PORTABLE CHEST 1 VIEW COMPARISON:  Chest radiograph dated 04/30/2023 FINDINGS: Normal lung volumes. No focal consolidations. Blunting of the left costophrenic angle. Mildly increased enlargement of the cardiomediastinal silhouette. No acute osseous abnormality. IMPRESSION: 1. Increased cardiomegaly. 2. Blunting of the left costophrenic angle, which may reflect pleural effusion. Electronically Signed   By: Agustin Cree M.D.   On: 09/18/2023 16:30     Microbiology: Results for orders placed or performed during the hospital encounter of 09/18/23  Resp panel by RT-PCR (RSV, Flu A&B, Covid) Anterior Nasal Swab     Status: None   Collection Time: 09/18/23  3:07 PM   Specimen: Anterior Nasal Swab  Result Value Ref Range Status   SARS Coronavirus 2 by RT PCR NEGATIVE NEGATIVE Final   Influenza A by PCR NEGATIVE NEGATIVE Final   Influenza B by PCR NEGATIVE NEGATIVE Final    Comment: (NOTE) The Xpert Xpress SARS-CoV-2/FLU/RSV plus assay is intended as an aid in the diagnosis of influenza from Nasopharyngeal swab specimens and should not be used as a sole basis for treatment. Nasal washings and aspirates are unacceptable for Xpert Xpress SARS-CoV-2/FLU/RSV testing.  Fact Sheet  for Patients: BloggerCourse.com  Fact Sheet for Healthcare Providers: SeriousBroker.it  This test is not yet approved or cleared by the [place] FDA and has been authorized for detection and/or diagnosis of SARS-CoV-2 by FDA under an Emergency Use Authorization (EUA). This EUA will remain in effect (meaning this test can be used) for the duration of the COVID-19 declaration under Section 564(b)(1) of the Act, 21 U.S.C. section 360bbb-3(b)(1), unless the authorization is terminated or revoked.     Resp Syncytial Virus by PCR NEGATIVE NEGATIVE Final    Comment: (NOTE) Fact Sheet for Patients: BloggerCourse.com  Fact Sheet for Healthcare Providers: SeriousBroker.it  This test is not yet approved or cleared by the [place] FDA and has been authorized for detection and/or diagnosis of SARS-CoV-2 by FDA under an Emergency Use Authorization (EUA). This EUA will remain in effect (meaning this test can be used) for the duration of the COVID-19 declaration under Section 564(b)(1) of the Act, 21 U.S.C. section 360bbb-3(b)(1), unless the authorization is  terminated or revoked.  Performed at [facility], [address], [place], [place] [address]     Labs: CBC: Recent Labs  Lab 09/20/23 [phone] 09/21/23 [phone] 09/23/23 0620 09/24/23 0556 09/25/23 0613  WBC 5.6 6.9 4.9 5.4 4.6  NEUTROABS  --  5.3 3.3  --   --   HGB 8.1* 8.6* 8.5* 8.3* 8.2*  HCT 26.8* 29.2* 29.1* 28.1* 27.7*  MCV 79.8* 81.1 80.4 81.0 81.5  PLT 379 389 347 320 289   Basic Metabolic Panel: Recent Labs  Lab 09/19/23 0917 09/20/23 0743 09/21/23 1032 09/23/23 0620 09/24/23 0556  NA 142 142 137 135 136  K 3.6 3.5 3.4* 3.3* 3.7  CL 108 106 100 98 102  CO2 23 26 28 30 26   GLUCOSE 139* 97 84 107* 105*  BUN 20 19 12 16 19   CREATININE 1.26* 1.24 0.96 1.27* 1.06  CALCIUM 9.8 9.8 9.5 9.7 9.6  MG  --   --   --  1.7  --   PHOS  --   --  3.3 3.3  --    Liver Function Tests: Recent Labs  Lab 09/18/23 1507 09/20/23 0743 09/21/23 1032 09/23/23 0620  AST 39 17  --   --   ALT 14 14  --   --   ALKPHOS 68 57  --   --   BILITOT 0.7 0.8  --   --   PROT 7.2 6.6  --   --   ALBUMIN 3.3* 3.0* 3.0* 3.0*   CBG: Recent Labs  Lab 09/21/23 0937  GLUCAP 83    Discharge time spent: 35 minutes.  Signed: Jacquelin Hawking, MD [facility] 09/25/2023

## 2023-09-25 NOTE — TOC Transition Note (Signed)
Transition of Care [facility]) - Discharge Note   Patient Details  Name: Keith [name] MRN: [id number] Date of Birth: April 04, 1951  [facility]) CM/SW Contact:  Ivo Moga A [place], LCSWA Phone Number: 09/25/2023, 4:41 PM   Clinical Narrative:     Patient will DC to: [facility]  Anticipated DC date: 09/25/23  Family notified: Rory Percy  Transport by: Sharin Mons      Per MD patient ready for DC to [facility]. RN, patient, patient's family, and facility notified of DC. Discharge Summary and FL2 sent to facility. RN to call report prior to discharge ( [phone]). DC packet on chart. Ambulance transport requested for patient.     CSW will sign off for now as social work intervention is no longer needed. Please consult [place] again if new needs arise.   Final next level of care: Skilled Nursing Facility Barriers to Discharge: Barriers Resolved   Patient Goals and CMS Choice            Discharge Placement              Patient chooses bed at: [facility] Patient to be transferred to facility by: [facility] Name of family member notified: Rory Percy Patient and family notified of of transfer: 09/25/23  Discharge Plan and Services Additional resources added to the After Visit Summary for                                       Social Drivers of Health (SDOH) Interventions SDOH Screenings   Food Insecurity: No Food Insecurity (09/18/2023)  Housing: Low Risk  (09/18/2023)  Transportation Needs: No Transportation Needs (09/18/2023)  Utilities: Not At Risk (09/18/2023)  Alcohol Screen: Low Risk  (03/16/2023)  Depression (PHQ2-9): Low Risk  (04/04/2023)  Financial Resource Strain: Low Risk  (03/16/2023)  Physical Activity: Insufficiently Active (03/16/2023)  Social Connections: Moderately Integrated (03/16/2023)  Stress: No Stress Concern Present (03/16/2023)  Tobacco Use: Medium Risk (09/21/2023)     Readmission  Risk Interventions    10/24/2022   12:05 PM 03/06/2022    9:59 AM  Readmission Risk Prevention Plan  Transportation Screening Complete Complete  PCP or Specialist Appt within 5-7 Days Complete   PCP or Specialist Appt within 3-5 Days  Complete  Home Care Screening Complete   Medication Review (RN CM) Complete   HRI or Home Care Consult  Complete  Social Work Consult for Recovery Care Planning/Counseling  Complete  Palliative Care Screening  Not Applicable  Medication Review Oceanographer)  Referral to Pharmacy

## 2023-09-25 NOTE — Plan of Care (Signed)
Pt has multiple different stages of healing abrasion on posterior portion of his scrotum. Per pt, he gets those when he tx him self from bed to [facility] at home. Area cleansed and foam dressing applied   Problem: Education: Goal: Knowledge of General Education information will improve Description: Including pain rating scale, medication(s)/side effects and non-pharmacologic comfort measures Outcome: Progressing   Problem: Health Behavior/Discharge Planning: Goal: Ability to manage health-related needs will improve Outcome: Progressing   Problem: Activity: Goal: Risk for activity intolerance will decrease Outcome: Progressing   Problem: Nutrition: Goal: Adequate nutrition will be maintained Outcome: Progressing   Problem: Coping: Goal: Level of anxiety will decrease Outcome: Progressing   Problem: Pain Management: Goal: General experience of comfort will improve Outcome: Progressing   Problem: Safety: Goal: Ability to remain free from injury will improve Outcome: Progressing

## 2023-09-25 NOTE — Progress Notes (Addendum)
Occupational Therapy Treatment Patient Details Name: Keith [name] MRN: [id number] DOB: May 30, 1951 Today's Date: 09/25/2023   History of present illness 72 y.o. male admitted 12/11 with shortness of breath; Severe symptomatic anemia-concern for slow GI bleeding/chronic blood loss, Acute on chronic HFpEF, and Acute hypoxic respiratory failure.  S/p EGD 12/13. Medical history significant of saddle PE/DVT requiring tPA in 2021-recurrent PE in 2023 after stopping DOAC, chronic HFpEF with RV failure, DM-2, HTN, spinal stenosis requiring numerous back surgeries-mostly wheelchair-bound   OT comments  Pt. Seen for skilled OT treatment session.  Pt. Pleasant and motivated for participation.  Able to manage BLEs and assist with bed mobility, requiring no physical assistance for BLEs and min a for trunk support from side lying to sit.  Pt. Able to assist with BUEs for use of stedy for transfer to bsc.  Cont. With acute OT POC and progress activity tolerance and adls as pt. Able.  Agree with recommendation for intense inpatient AIR level therapies > 3hrs/day.        If plan is discharge home, recommend the following:  A lot of help with bathing/dressing/bathroom;A lot of help with walking and/or transfers;Help with stairs or ramp for entrance;Assistance with cooking/housework;Assist for transportation   Equipment Recommendations  None recommended by OT    Recommendations for Other Services  AIR inpatient therapy >3hrs/day    Precautions / Restrictions Precautions Precautions: Fall       Mobility Bed Mobility Overal bed mobility: Needs Assistance Bed Mobility: Rolling, Sidelying to Sit Rolling: Contact guard assist Sidelying to sit: Min assist       General bed mobility comments: CGA for sheets, pt. able to managed BLES and pull them eob/oob without assistance.  min a for trunk support to transition from sidelying to sit.    Transfers Overall transfer level: Needs assistance Equipment  used: Ambulation equipment used Transfers: Sit to/from Stand, Bed to chair/wheelchair/BSC Sit to Stand: Min assist, +2 safety/equipment, Via lift equipment           General transfer comment: pt. able to use BUEs to pull on stedy bar for placement of buttocks support pieces x2 eob to stedy and stedy to bsc. very motivated but fatigues easily Transfer via Lift Equipment: Stedy   Balance                                           ADL either performed or assessed with clinical judgement   ADL Overall ADL's : Needs assistance/impaired                         Toilet Transfer: +2 for safety/equipment;Minimal assistance;Cueing for sequencing;BSC/3in1 (with use of transfer equipment-stedy) Toilet Transfer Details (indicate cue type and reason): utilized stedy for transfer from eob to [employer]- Architect and Hygiene: Set up;Sitting/lateral lean              Extremity/Trunk Assessment              Vision       Perception     Praxis      Cognition Arousal: Alert Behavior During Therapy: WFL for tasks assessed/performed Overall Cognitive Status: Within Functional Limits for tasks assessed  General Comments: Oriented x4, able to follow commands, friendly        Exercises      Shoulder Instructions       General Comments  Retired from [employer]. 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     Pertinent Vitals/ Pain       Pain Assessment Pain Assessment: Faces Faces Pain Scale: Hurts little more Pain Location: "I'm chronic, i have pain all over all the time" Pain Descriptors / Indicators: Aching Pain Intervention(s): Limited activity within patient's tolerance, Monitored during session, Repositioned  Home Living                                          Prior Functioning/Environment              Frequency  Min 1X/week        Progress Toward  Goals  OT Goals(current goals can now be found in the care plan section)  Progress towards OT goals: Progressing toward goals     Plan      Co-evaluation                 AM-PAC OT "6 Clicks" Daily Activity     Outcome Measure   Help from another person eating meals?: None Help from another person taking care of personal grooming?: A Little Help from another person toileting, which includes using toliet, bedpan, or urinal?: A Lot Help from another person bathing (including washing, rinsing, drying)?: A Lot Help from another person to put on and taking off regular upper body clothing?: A Little Help from another person to put on and taking off regular lower body clothing?: A Lot 6 Click Score: 16    End of Session    OT Visit Diagnosis: Muscle weakness (generalized) (M62.81);Unsteadiness on feet (R26.81);Other abnormalities of gait and mobility (R26.89)   Activity Tolerance Patient tolerated treatment well   Patient Left with nursing/sitter in room;with call bell/phone within reach;Other (comment) (pt. on bsc with call bell within reach, rn present in room talking with pt. at end of session)   Nurse Communication Other (comment) (cna present and assisting with stedy and pt. trasnfer.  stedy left with pt. and cna to assist pt. once he is finished using the b.room.)        Time: 0851-0901 OT Time Calculation (min): 10 min  Charges: OT General Charges $OT Visit: 1 Visit OT Treatments $Self Care/Home Management : 8-22 mins  [name], COTA/L Acute Rehabilitation [phone]   [name] 09/25/2023, 9:39 AM

## 2023-09-26 ENCOUNTER — Other Ambulatory Visit: Payer: Self-pay

## 2023-09-26 ENCOUNTER — Other Ambulatory Visit (HOSPITAL_COMMUNITY): Payer: Self-pay

## 2023-09-26 NOTE — Progress Notes (Signed)
Patient called in stating he is missing his home medication. This nurse called pharmacy and verified that it is indeed still in pharmacy. This nurse to go to pharmacy and hand deliver to wife upon arrival.

## 2023-09-27 ENCOUNTER — Other Ambulatory Visit (HOSPITAL_BASED_OUTPATIENT_CLINIC_OR_DEPARTMENT_OTHER): Payer: Self-pay

## 2023-09-30 ENCOUNTER — Encounter (HOSPITAL_COMMUNITY): Payer: Medicare Other

## 2023-09-30 NOTE — Telephone Encounter (Signed)
Contacted patient who states that he is currently in SNF, [facility] and is unsure when he will be back home. I have attempted to reach nursing staff at [facility] (phone [phone]) but get no answer when I am transferred to "[facility]" nursing station. Will attempt co contact again at a later time.

## 2023-10-04 NOTE — Telephone Encounter (Signed)
Again, contacted [facility]. Spoke to [place] who transferred me to nursing station. There was continuous ringing, with no answer. Called back and asked for DON. Per Maurine Minister, she is off today. Will attempt to reach out again at a later time.

## 2023-10-07 NOTE — Telephone Encounter (Signed)
Spoke to nurse, Sudie Grumbling to ask that patient have CBC. She states she will order this to be completed tomorrow morning and will fax results at that time. In addition, I have asked that patient be provided with transportation to office visit with Hyacinth Meeker, PA-C on 10/24/23 at 1030 am. Sudie Grumbling states that she will also ask transportation/scheduling to arrange for this. Our phone number, address and time/date of appointment has been given to [facility] as well.

## 2023-10-08 ENCOUNTER — Other Ambulatory Visit (HOSPITAL_COMMUNITY): Payer: Self-pay

## 2023-10-08 MED ORDER — GABAPENTIN 800 MG PO TABS
800.0000 mg | ORAL_TABLET | Freq: Three times a day (TID) | ORAL | 0 refills | Status: DC
  Filled 2023-10-08: qty 90, 30d supply, fill #0

## 2023-10-08 MED ORDER — HYDROCODONE-ACETAMINOPHEN 7.5-325 MG PO TABS
1.0000 | ORAL_TABLET | Freq: Four times a day (QID) | ORAL | 0 refills | Status: DC
  Filled 2023-10-08: qty 120, 30d supply, fill #0

## 2023-10-09 DIAGNOSIS — K219 Gastro-esophageal reflux disease without esophagitis: Secondary | ICD-10-CM | POA: Diagnosis not present

## 2023-10-09 DIAGNOSIS — Z79899 Other long term (current) drug therapy: Secondary | ICD-10-CM | POA: Diagnosis not present

## 2023-10-09 DIAGNOSIS — G4733 Obstructive sleep apnea (adult) (pediatric): Secondary | ICD-10-CM | POA: Diagnosis not present

## 2023-10-09 DIAGNOSIS — R262 Difficulty in walking, not elsewhere classified: Secondary | ICD-10-CM | POA: Diagnosis not present

## 2023-10-09 DIAGNOSIS — R531 Weakness: Secondary | ICD-10-CM | POA: Diagnosis not present

## 2023-10-09 DIAGNOSIS — N183 Chronic kidney disease, stage 3 unspecified: Secondary | ICD-10-CM | POA: Diagnosis not present

## 2023-10-09 DIAGNOSIS — I130 Hypertensive heart and chronic kidney disease with heart failure and stage 1 through stage 4 chronic kidney disease, or unspecified chronic kidney disease: Secondary | ICD-10-CM | POA: Diagnosis not present

## 2023-10-09 DIAGNOSIS — K642 Third degree hemorrhoids: Secondary | ICD-10-CM | POA: Diagnosis not present

## 2023-10-09 DIAGNOSIS — E1122 Type 2 diabetes mellitus with diabetic chronic kidney disease: Secondary | ICD-10-CM | POA: Diagnosis not present

## 2023-10-09 DIAGNOSIS — Z993 Dependence on wheelchair: Secondary | ICD-10-CM | POA: Diagnosis not present

## 2023-10-09 DIAGNOSIS — I2724 Chronic thromboembolic pulmonary hypertension: Secondary | ICD-10-CM | POA: Diagnosis not present

## 2023-10-09 DIAGNOSIS — Z23 Encounter for immunization: Secondary | ICD-10-CM | POA: Diagnosis not present

## 2023-10-09 DIAGNOSIS — N1832 Chronic kidney disease, stage 3b: Secondary | ICD-10-CM | POA: Diagnosis not present

## 2023-10-09 DIAGNOSIS — M4800 Spinal stenosis, site unspecified: Secondary | ICD-10-CM | POA: Diagnosis not present

## 2023-10-09 DIAGNOSIS — M6281 Muscle weakness (generalized): Secondary | ICD-10-CM | POA: Diagnosis not present

## 2023-10-09 DIAGNOSIS — Z86718 Personal history of other venous thrombosis and embolism: Secondary | ICD-10-CM | POA: Diagnosis not present

## 2023-10-09 DIAGNOSIS — Z86711 Personal history of pulmonary embolism: Secondary | ICD-10-CM | POA: Diagnosis not present

## 2023-10-09 DIAGNOSIS — E8809 Other disorders of plasma-protein metabolism, not elsewhere classified: Secondary | ICD-10-CM | POA: Diagnosis not present

## 2023-10-09 DIAGNOSIS — I50810 Right heart failure, unspecified: Secondary | ICD-10-CM | POA: Diagnosis not present

## 2023-10-09 DIAGNOSIS — G894 Chronic pain syndrome: Secondary | ICD-10-CM | POA: Diagnosis not present

## 2023-10-09 DIAGNOSIS — I10 Essential (primary) hypertension: Secondary | ICD-10-CM | POA: Diagnosis not present

## 2023-10-09 DIAGNOSIS — M48061 Spinal stenosis, lumbar region without neurogenic claudication: Secondary | ICD-10-CM | POA: Diagnosis not present

## 2023-10-09 DIAGNOSIS — G9520 Unspecified cord compression: Secondary | ICD-10-CM | POA: Diagnosis not present

## 2023-10-09 DIAGNOSIS — E669 Obesity, unspecified: Secondary | ICD-10-CM | POA: Diagnosis not present

## 2023-10-09 DIAGNOSIS — E785 Hyperlipidemia, unspecified: Secondary | ICD-10-CM | POA: Diagnosis not present

## 2023-10-09 DIAGNOSIS — I5030 Unspecified diastolic (congestive) heart failure: Secondary | ICD-10-CM | POA: Diagnosis not present

## 2023-10-09 DIAGNOSIS — Z7901 Long term (current) use of anticoagulants: Secondary | ICD-10-CM | POA: Diagnosis not present

## 2023-10-09 DIAGNOSIS — Z87891 Personal history of nicotine dependence: Secondary | ICD-10-CM | POA: Diagnosis not present

## 2023-10-09 DIAGNOSIS — D509 Iron deficiency anemia, unspecified: Secondary | ICD-10-CM | POA: Diagnosis not present

## 2023-10-09 DIAGNOSIS — M150 Primary generalized (osteo)arthritis: Secondary | ICD-10-CM | POA: Diagnosis not present

## 2023-10-09 DIAGNOSIS — R739 Hyperglycemia, unspecified: Secondary | ICD-10-CM | POA: Diagnosis not present

## 2023-10-09 DIAGNOSIS — K5900 Constipation, unspecified: Secondary | ICD-10-CM | POA: Diagnosis not present

## 2023-10-09 DIAGNOSIS — G4700 Insomnia, unspecified: Secondary | ICD-10-CM | POA: Diagnosis not present

## 2023-10-09 DIAGNOSIS — E876 Hypokalemia: Secondary | ICD-10-CM | POA: Diagnosis not present

## 2023-10-09 DIAGNOSIS — E1142 Type 2 diabetes mellitus with diabetic polyneuropathy: Secondary | ICD-10-CM | POA: Diagnosis not present

## 2023-10-09 DIAGNOSIS — I272 Pulmonary hypertension, unspecified: Secondary | ICD-10-CM | POA: Diagnosis not present

## 2023-10-09 DIAGNOSIS — I071 Rheumatic tricuspid insufficiency: Secondary | ICD-10-CM | POA: Diagnosis not present

## 2023-10-09 DIAGNOSIS — I2721 Secondary pulmonary arterial hypertension: Secondary | ICD-10-CM | POA: Diagnosis not present

## 2023-10-09 DIAGNOSIS — G2581 Restless legs syndrome: Secondary | ICD-10-CM | POA: Diagnosis not present

## 2023-10-09 DIAGNOSIS — Z6830 Body mass index (BMI) 30.0-30.9, adult: Secondary | ICD-10-CM | POA: Diagnosis not present

## 2023-10-09 DIAGNOSIS — D500 Iron deficiency anemia secondary to blood loss (chronic): Secondary | ICD-10-CM | POA: Diagnosis not present

## 2023-10-09 DIAGNOSIS — N181 Chronic kidney disease, stage 1: Secondary | ICD-10-CM | POA: Diagnosis not present

## 2023-10-09 DIAGNOSIS — G8929 Other chronic pain: Secondary | ICD-10-CM | POA: Diagnosis not present

## 2023-10-09 DIAGNOSIS — K922 Gastrointestinal hemorrhage, unspecified: Secondary | ICD-10-CM | POA: Diagnosis not present

## 2023-10-09 DIAGNOSIS — J9601 Acute respiratory failure with hypoxia: Secondary | ICD-10-CM | POA: Diagnosis not present

## 2023-10-09 DIAGNOSIS — I5032 Chronic diastolic (congestive) heart failure: Secondary | ICD-10-CM | POA: Diagnosis not present

## 2023-10-11 ENCOUNTER — Other Ambulatory Visit (HOSPITAL_COMMUNITY): Payer: Self-pay

## 2023-10-11 DIAGNOSIS — Z79899 Other long term (current) drug therapy: Secondary | ICD-10-CM | POA: Diagnosis not present

## 2023-10-13 DIAGNOSIS — R531 Weakness: Secondary | ICD-10-CM | POA: Diagnosis not present

## 2023-10-13 DIAGNOSIS — I5030 Unspecified diastolic (congestive) heart failure: Secondary | ICD-10-CM | POA: Diagnosis not present

## 2023-10-16 DIAGNOSIS — R531 Weakness: Secondary | ICD-10-CM | POA: Diagnosis not present

## 2023-10-16 DIAGNOSIS — G8929 Other chronic pain: Secondary | ICD-10-CM | POA: Diagnosis not present

## 2023-10-19 ENCOUNTER — Other Ambulatory Visit: Payer: Self-pay | Admitting: Cardiology

## 2023-10-22 ENCOUNTER — Ambulatory Visit (HOSPITAL_COMMUNITY)
Admission: RE | Admit: 2023-10-22 | Discharge: 2023-10-22 | Disposition: A | Discharge status: Home / Self Care | Payer: Medicare Other | Source: Ambulatory Visit | Attending: Family Medicine

## 2023-10-22 ENCOUNTER — Encounter (HOSPITAL_COMMUNITY): Payer: Self-pay

## 2023-10-22 VITALS — BP 112/68 | HR 82 | Wt 215.5 lb

## 2023-10-22 DIAGNOSIS — Z86711 Personal history of pulmonary embolism: Secondary | ICD-10-CM | POA: Insufficient documentation

## 2023-10-22 DIAGNOSIS — M4800 Spinal stenosis, site unspecified: Secondary | ICD-10-CM | POA: Insufficient documentation

## 2023-10-22 DIAGNOSIS — M48061 Spinal stenosis, lumbar region without neurogenic claudication: Secondary | ICD-10-CM

## 2023-10-22 DIAGNOSIS — Z6830 Body mass index (BMI) 30.0-30.9, adult: Secondary | ICD-10-CM | POA: Diagnosis not present

## 2023-10-22 DIAGNOSIS — I50810 Right heart failure, unspecified: Secondary | ICD-10-CM

## 2023-10-22 DIAGNOSIS — Z79899 Other long term (current) drug therapy: Secondary | ICD-10-CM | POA: Insufficient documentation

## 2023-10-22 DIAGNOSIS — I2721 Secondary pulmonary arterial hypertension: Secondary | ICD-10-CM | POA: Diagnosis not present

## 2023-10-22 DIAGNOSIS — Z87891 Personal history of nicotine dependence: Secondary | ICD-10-CM | POA: Insufficient documentation

## 2023-10-22 DIAGNOSIS — I130 Hypertensive heart and chronic kidney disease with heart failure and stage 1 through stage 4 chronic kidney disease, or unspecified chronic kidney disease: Secondary | ICD-10-CM | POA: Diagnosis not present

## 2023-10-22 DIAGNOSIS — N183 Chronic kidney disease, stage 3 unspecified: Secondary | ICD-10-CM | POA: Diagnosis not present

## 2023-10-22 DIAGNOSIS — I272 Pulmonary hypertension, unspecified: Secondary | ICD-10-CM

## 2023-10-22 DIAGNOSIS — G4733 Obstructive sleep apnea (adult) (pediatric): Secondary | ICD-10-CM | POA: Insufficient documentation

## 2023-10-22 DIAGNOSIS — I10 Essential (primary) hypertension: Secondary | ICD-10-CM

## 2023-10-22 DIAGNOSIS — Z7901 Long term (current) use of anticoagulants: Secondary | ICD-10-CM | POA: Diagnosis not present

## 2023-10-22 DIAGNOSIS — I071 Rheumatic tricuspid insufficiency: Secondary | ICD-10-CM

## 2023-10-22 DIAGNOSIS — G8929 Other chronic pain: Secondary | ICD-10-CM | POA: Diagnosis not present

## 2023-10-22 DIAGNOSIS — R531 Weakness: Secondary | ICD-10-CM | POA: Diagnosis not present

## 2023-10-22 DIAGNOSIS — E669 Obesity, unspecified: Secondary | ICD-10-CM | POA: Insufficient documentation

## 2023-10-22 DIAGNOSIS — Z86718 Personal history of other venous thrombosis and embolism: Secondary | ICD-10-CM | POA: Diagnosis not present

## 2023-10-22 DIAGNOSIS — Z993 Dependence on wheelchair: Secondary | ICD-10-CM | POA: Insufficient documentation

## 2023-10-22 DIAGNOSIS — E1122 Type 2 diabetes mellitus with diabetic chronic kidney disease: Secondary | ICD-10-CM | POA: Insufficient documentation

## 2023-10-22 DIAGNOSIS — I5032 Chronic diastolic (congestive) heart failure: Secondary | ICD-10-CM | POA: Diagnosis not present

## 2023-10-22 DIAGNOSIS — E663 Overweight: Secondary | ICD-10-CM

## 2023-10-22 LAB — BASIC METABOLIC PANEL
Anion gap: 9 (ref 5–15)
BUN: 27 mg/dL — ABNORMAL HIGH (ref 8–23)
CO2: 23 mmol/L (ref 22–32)
Calcium: 10.2 mg/dL (ref 8.9–10.3)
Chloride: 108 mmol/L (ref 98–111)
Creatinine, Ser: 0.99 mg/dL (ref 0.61–1.24)
GFR, Estimated: >60 mL/min (ref >60)
Glucose, Bld: 116 mg/dL — ABNORMAL HIGH (ref 70–99)
Potassium: 3.9 mmol/L (ref 3.5–5.1)
Sodium: 140 mmol/L (ref 135–145)

## 2023-10-22 LAB — BRAIN NATRIURETIC PEPTIDE: B Natriuretic Peptide: 25.3 pg/mL (ref 0.0–100.0)

## 2023-10-22 NOTE — Progress Notes (Addendum)
 ADVANCED HF CLINIC NOTE  Primary Care: Clarabel Marion, [name], MD HF Cardiologist: Dr. Rolan  Chief Complaint: Heart Failure Follow-up   HPI: Keith [name] is a 73 y.o.male with history saddle PE/LLE DVT 09/21 s/p TPA complicated by hypotension requiring Levophed , multiple prior back surgeries, left adrenal adenoma, DM II, obesity, HTN.   Admitted 03/01/22 with CP. He was hypoxic requiring supplemental oxygen . V/Q scan with moderate to high probability of PE.  Lifelong DOAC recommended, started on Eliquis  5 BID (had been off eliquis  since 02/22).   Echo in 5/23 showed EF 55-60%, mild LVH, interventricular septum flattened in systole and diastole consistent with RV pressure and volume overload, RV severely enlarged with severely reduced function, RVSP 75 mmH, moderate to severe TR.   RHC/LHC in 5/23 showed no CAD, elevated right and left heart filling pressures, and pulmonary venous hypertension.  He was seen by CT surgery. Felt to be high risk for pulmonary embolectomy. Course complicated by AKI, Scr peaked at 3.34 and improved to 1.60 (baseline). Started on torsemide  20 mg M, W, F at discharge. Atenolol /HTCZ stopped.   Echo in 9/23 showed EF 55-60%, mildly decreased RV function with mild-moderate RV enlargement, unable to estimate PA systolic pressure, IVC normal. RHC was done in 10/23, showing moderate pulmonary arterial hypertension with PVR 5 WU.    V/Q scan in 7/24 showed right lung findings suggestive of chronic PE.   Follow up 8/24, volume stable. Opsumit  10 mg daily added, referred to [facility] for consideration of balloon pulmonary angioplasty (would not be good candidate for pulmonary thromboendarterectomy).   Echo 8/24 showed hyperdynamic LV with EF 70-75%, RV read as normal.   Seen for follow up 09/18/23 feeling fair, WC bound. Labs showed hgb 8.5>6.6. He was called and advised to go to the ED for eval. He was admitted for GIB. Eliquis  held. Underwent EGD/colonoscopy showing erythematous  mucosa;  2 polys that were removed, sigmoid colon diverticulosis, localized inflammation involving the distal rectum that was biopsied, nonbleeding external and internal hemorrhoids, normal ileum and no blood noted throughout the GI lumen. Amlodpine and metolazone  were stopped at discharge. He was discharged to SNF.  Today he returns for post hospital HF follow up. WC bound. Overall feeling much better. Remains in SNF, working with physical therapy. Improvement in breathing a fatigue since last admission. Sleeping better. No dizziness, CP, or Palpitaions. No bleeding. Wearing compression socks, but forgot them today. Continues to lose weight. Appetite ok. No fever or chills. Weight 215 pounds. Taking all medications.   ReDS: 32% today  ECG (personally reviewed): Sinus rhythm 86 bpm with 1 deg AVB  Labs (1/24): K 3.8, creatinine 1.03 Labs (3/24): BNP 17, K 4.1, creatinine 1.36 Labs (9/24): K 4.1, creatinine 1.31. Labs (12/24): K 3.7, creatinine 1.06, hgb 8.2  PMH: 1. Type 2 diabetes 2. Left adrenal adenoma 3. Obesity 4. HTN 5. H/o back surgery for spinal stenosis: Has had 3 surgeries, actually could not walk after the third surgery but has been doing PT.  6. Venous thromboembolism: Saddle PE in 9/21 with LLE DVT, this was treated with TPA.   - Recurrent PE in 5/23 after stopping DOAC.  - Echo (5/23): EF 55-60%, mild LVH, interventricular septum flattened in systole and diastole consistent with RV pressure and volume overload, RV severely enlarged with severely reduced function, RVSP 75 mmH, moderate to severe TR.  - R/LHC (5/23): RA 12, PA 48/34 (39), PCWP mean 40, V wave 42, Fick CO/CI 5.93/2.33, PVR ?< 1 WU.  -  Echo (9/23): EF 55-60%, mildly decreased RV function with mild-moderate RV enlargement, unable to estimate PA systolic pressure, IVC normal.  - RHC (10/23): mean RA 9, PA 60/30 mean 36, mean PCWP 6, CI 2.46, PVR 5 WU - CTA chest (1/24): No PE - ANA/anti-cardiolipid/beta-2   glycoprotein/CCP negative, ESR 50, RF 42 (2/24) - V/Q scan (7/24): right lung findings suggestive of chronic PE.  - Echo (8/24): EF hyperdynamic LV with EF 70-75%, RV read as normal.  7. CKD stage 3 8. OSA: Uses CPAP.   Review of Systems: All systems reviewed and negative except as per HPI.   Current Outpatient Medications  Medication Sig Dispense Refill   carvedilol  (COREG ) 12.5 MG tablet TAKE 1 TABLET BY MOUTH TWICE  DAILY WITH MEALS 200 tablet 2   cholecalciferol  (VITAMIN D3) 25 MCG (1000 UNIT) tablet Take 1,000 Units by mouth daily.     DULoxetine  (CYMBALTA ) 30 MG capsule Take 1 capsule (30 mg total) by mouth daily. 60 capsule 0   ELIQUIS  5 MG TABS tablet TAKE 1 TABLET BY MOUTH TWICE A DAY 60 tablet 11   ferrous sulfate  325 (65 FE) MG tablet TAKE 1 TABLET BY MOUTH EVERY DAY WITH BREAKFAST (Patient taking differently: Take 325 mg by mouth daily as needed.) 90 tablet 1   gabapentin  (NEURONTIN ) 800 MG tablet Take 1 tablet (800 mg total) by mouth 3 (three) times daily for neuropathy. 90 tablet 0   HYDROcodone -acetaminophen  (NORCO) 7.5-325 MG tablet Take 1 tablet by mouth in the morning, at noon, in the evening, and at bedtime. 7.5 mg/375 mg (Patient taking differently: Take 1 tablet by mouth in the morning, at noon, in the evening, and at bedtime. 7.5 mg/375 mg as needed) 10 tablet 0   HYDROcodone -acetaminophen  (NORCO) 7.5-325 MG tablet Take 1 tablet by mouth 4 (four) times daily as needed for pain. 120 tablet 0   levETIRAcetam  (KEPPRA ) 250 MG tablet TAKE 1 TABLET BY MOUTH TWICE  DAILY 180 tablet 2   lidocaine  (LIDODERM ) 5 % Place 1 patch onto the skin daily as needed (pain). Remove & Discard patch within 12 hours or as directed by MD     macitentan  (OPSUMIT ) 10 MG tablet Take 10 mg by mouth daily.     naloxone  (NARCAN ) nasal spray 4 mg/0.1 mL Place 1 spray into the nose as needed. 1 each 3   omeprazole  (PRILOSEC) 40 MG capsule TAKE 1 CAPSULE BY MOUTH DAILY 100 capsule 2   polyethylene glycol  (MIRALAX  / GLYCOLAX ) 17 g packet Take 17 g by mouth daily as needed for mild constipation.     Riociguat  (ADEMPAS ) 2 MG TABS Take 2 mg by mouth in the morning, at noon, and at bedtime. 90 tablet 11   rOPINIRole  (REQUIP ) 1 MG tablet TAKE 1 TABLET BY MOUTH AT  BEDTIME 100 tablet 2   rosuvastatin  (CRESTOR ) 20 MG tablet TAKE 1 TABLET BY MOUTH DAILY 100 tablet 2   senna-docusate (SENOKOT-S) 8.6-50 MG tablet Take 2 tablets by mouth at bedtime. 60 tablet 0   spironolactone  (ALDACTONE ) 25 MG tablet TAKE 1 TABLET BY MOUTH DAILY 100 tablet 2   torsemide  40 MG TABS Take 60 mg by mouth daily. 30 tablet 0   traZODone  (DESYREL ) 50 MG tablet TAKE 1/2 TO 1 TABLET BY MOUTH AT BEDTIME AS NEEDED FOR SLEEP 90 tablet 1   vitamin C  (ASCORBIC ACID ) 250 MG tablet Take 250 mg by mouth daily.     No current facility-administered medications for this encounter.   Allergies  Allergen Reactions   Aspirin  Other (See Comments)    Irritates the stomach and the patient developed ulcers, also   Lisinopril  Other (See Comments)    Caused a body ache   Social History   Socioeconomic History   Marital status: Married    Spouse name: Wanda   Number of children: 3   Years of education: 4 years college   Highest education level: Bachelor's degree (e.g., BA, AB, BS)  Occupational History   Occupation: retired  Tobacco Use   Smoking status: Former    Current packs/day: 0.00    Average packs/day: 1 pack/day for 25.0 years (25.0 ttl pk-yrs)    Types: Cigarettes    Start date: 35    Quit date: 1995    Years since quitting: 30.0    Passive exposure: Never   Smokeless tobacco: Never  Vaping Use   Vaping status: Never Used  Substance and Sexual Activity   Alcohol use: Yes    Comment: holidays and special occasions   Drug use: Not Currently   Sexual activity: Yes    Birth control/protection: None  Other Topics Concern   Not on file  Social History Narrative   Married, 5 kids, 14 grand, 2 great grand, watch TV,  Armed Forces Operational Officer, traveling   Social Drivers of Corporate Investment Banker Strain: Low Risk  (03/16/2023)   Overall Financial Resource Strain (CARDIA)    Difficulty of Paying Living Expenses: Not hard at all  Food Insecurity: No Food Insecurity (09/18/2023)   Hunger Vital Sign    Worried About Running Out of Food in the Last Year: Never true    Ran Out of Food in the Last Year: Never true  Transportation Needs: No Transportation Needs (09/18/2023)   PRAPARE - Administrator, Civil Service (Medical): No    Lack of Transportation (Non-Medical): No  Physical Activity: Insufficiently Active (03/16/2023)   Exercise Vital Sign    Days of Exercise per Week: 3 days    Minutes of Exercise per Session: 30 min  Stress: No Stress Concern Present (03/16/2023)   [employer] of Occupational Health - Occupational Stress Questionnaire    Feeling of Stress : Not at all  Social Connections: Moderately Integrated (03/16/2023)   Social Connection and Isolation Panel [NHANES]    Frequency of Communication with Friends and Family: More than three times a week    Frequency of Social Gatherings with Friends and Family: Twice a week    Attends Religious Services: More than 4 times per year    Active Member of [employer] or Organizations: No    Attends Engineer, Structural: Not on file    Marital Status: Married  Catering Manager Violence: Not At Risk (09/18/2023)   Humiliation, Afraid, Rape, and Kick questionnaire    Fear of Current or Ex-Partner: No    Emotionally Abused: No    Physically Abused: No    Sexually Abused: No   Family History  Problem Relation Age of Onset   Diabetes Mother    Asthma Mother    Breast cancer Mother    Colon polyps Mother    Hypertension Father    Stroke Father    Heart failure Brother    Stroke Brother    Ovarian cancer Maternal Grandmother    Hypertension Maternal Grandfather    Heart attack Maternal Grandfather    Colon cancer Neg Hx    BP  112/68   Pulse 82   Wt 97.8  kg (215 lb 8 oz)   SpO2 95%   BMI 30.92 kg/m   Wt Readings from Last 3 Encounters:  10/22/23 97.8 kg (215 lb 8 oz)  09/25/23 100.9 kg (222 lb 7.1 oz)  04/03/23 120 kg (264 lb 8 oz)   PHYSICAL EXAM: General: Well appearing. No distress on RA HEENT: neck supple.   Cardiac: JVP not visible +HJR. S1 and S2 present. No murmurs or rub. Resp: Lung sounds clear and equal B/L Abdomen: Soft, non-tender, non-distended. + BS. Extremities: Warm and dry. No rash, cyanosis. 1-2+ LLE peripheral edema Neuro: Alert and oriented x3. Affect pleasant. Moves all extremities without difficulty.  ASSESSMENT & PLAN: 1.  Chronic diastolic CHF with RV failure:  Suspect RV strain from prior PEs.  Echo in 5/23 showed EF 55-60%, RV severely reduced, RVSP 75 mmHg, moderate to severe TR.  Echo in 9/23 showed EF 55-60%, mildly decreased RV function with mild-moderate RV enlargement, unable to estimate PA systolic pressure, IVC normal.  RHC in 10/23 showed moderate pulmonary arterial hypertension.  Echo (8/24) showed hyperdynamic LV with EF 70-75%, RV normal, unable to estimate PA pressure. Not volume overloaded on exam or by ReDS today. NYHA III-IIIb recently, functional status is complicated by poor mobility due to spinal stenosis.  - Continue torsemide  to 60 mg daily + 20 KCL daily. BMET/BNP today. - Continue spironolactone  25 mg daily.  - Continue Coreg  12.5 mg bid - Hold off on SGLT2i with recurrent UTIs.  2. Pulmonary hypertension: RHC in 10/23 showed moderate pulmonary arterial hypertension.  Based on history, this seems most likely to be chronic thromboembolic pulmonary hypertension.  He was found to have elevations in RF and ESR but negative CCP antibody.  Workup by rheumatology did not show signs of active inflammatory disease on exam. V/Q scan was finally done in 7/24, this was consistent with chronic pulmonary emboli in the right lung. Echo (8/24) showed hyperdynamic LV, RV read as  normal, PA pressures unable to be estimated. - Back on Eliquis  5 mg bid - He has been referred to [facility] for consideration of balloon pulmonary angioplasty (would not be good candidate for pulmonary thromboendarterectomy). He has not heard from [facility] yet. - Continue CPAP for OSA.  - With suspected CTEPH, he has been on Adempas , only able to get to 2 mg tid.  - Continue Opsumit  10 mg daily.  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 Discussed with Dr. Rolan and is reasonable. 3. Tricuspid regurgitation: Moderate to severe on echo 05/23. Minimal on echo in 9/23. None on most recent echo (8/24) 4. History PE/DVT: PE/DVT 09/21 s/p TPA, required pressor support with NE. Recurrent PE in 5/23 off Eliquis . Now concern for chronic thromboembolic disease as above.  - Will need lifelong anticoagulation. Back on Eliquis . 5. HTN: BP controlled.  6. Spinal stenosis: Limited mobility at baseline. Currently at SNF working with PT. 7. OSA: Continue CPAP.  8. Obesity: Body mass index is 30.92 kg/m. - He has stopped his Mounjaro  due to side effects. - Has lost 50 lbs in last 6 months.  Bennye Nix, NP 10/22/2023

## 2023-10-22 NOTE — Progress Notes (Signed)
 ReDS Vest / Clip - 10/22/23 1200       ReDS Vest / Clip   Station Marker D    Ruler Value 32    ReDS Value Range Low volume    ReDS Actual Value 32

## 2023-10-22 NOTE — Addendum Note (Signed)
 Encounter addended by: Margurete Guaman, [place], NP on: 10/22/2023 2:17 PM  Actions taken: Clinical Note Signed

## 2023-10-22 NOTE — Patient Instructions (Signed)
 Medication Changes:  No Changes In Medications at this time.   Lab Work:  Labs done today, your results will be available in MyChart, we will contact you for abnormal readings.  Follow-Up in: as scheduled   At the [facility], you and your health needs are our priority. We have a designated team specialized in the treatment of Heart Failure. This Care Team includes your primary Heart Failure Specialized Cardiologist (physician), Advanced Practice Providers (APPs- Physician Assistants and Nurse Practitioners), and Pharmacist who all work together to provide you with the care you need, when you need it.   You may see any of the following providers on your designated Care Team at your next follow up:  Dr. Arvilla Meres Dr. Marca Ancona Dr. Dorthula Nettles Dr. Theresia Bough Tonye Becket, NP Robbie Lis, [place] [facility] [place], [place] Brynda Peon, NP [place] Lee, NP Karle Plumber, PharmD   Please be sure to bring in all your medications bottles to every appointment.   Need to Contact us:  If you have any questions or concerns before your next appointment please send [place] a message through [place] or call our office at [phone].    TO LEAVE A MESSAGE FOR THE NURSE SELECT OPTION 2, PLEASE LEAVE A MESSAGE INCLUDING: YOUR NAME DATE OF BIRTH CALL BACK NUMBER REASON FOR CALL**this is important as we prioritize the call backs  YOU WILL RECEIVE A CALL BACK THE SAME DAY AS LONG AS YOU CALL BEFORE 4:00 PM

## 2023-10-24 ENCOUNTER — Ambulatory Visit: Payer: Medicare Other | Admitting: Physician Assistant

## 2023-10-24 ENCOUNTER — Encounter: Payer: Self-pay | Admitting: Physician Assistant

## 2023-10-24 ENCOUNTER — Other Ambulatory Visit: Payer: Medicare Other

## 2023-10-24 VITALS — BP 124/72 | HR 96 | Ht 70.0 in

## 2023-10-24 DIAGNOSIS — D509 Iron deficiency anemia, unspecified: Secondary | ICD-10-CM

## 2023-10-24 DIAGNOSIS — N181 Chronic kidney disease, stage 1: Secondary | ICD-10-CM | POA: Diagnosis not present

## 2023-10-24 DIAGNOSIS — Z7901 Long term (current) use of anticoagulants: Secondary | ICD-10-CM | POA: Diagnosis not present

## 2023-10-24 DIAGNOSIS — R739 Hyperglycemia, unspecified: Secondary | ICD-10-CM | POA: Diagnosis not present

## 2023-10-24 DIAGNOSIS — I5030 Unspecified diastolic (congestive) heart failure: Secondary | ICD-10-CM | POA: Diagnosis not present

## 2023-10-24 DIAGNOSIS — G2581 Restless legs syndrome: Secondary | ICD-10-CM | POA: Diagnosis not present

## 2023-10-24 DIAGNOSIS — R531 Weakness: Secondary | ICD-10-CM | POA: Diagnosis not present

## 2023-10-24 LAB — CBC WITH DIFFERENTIAL/PLATELET
Basophils Absolute: 0 10*3/uL (ref 0.0–0.1)
Basophils Relative: 0.4 % (ref 0.0–3.0)
Eosinophils Absolute: 0.2 10*3/uL (ref 0.0–0.7)
Eosinophils Relative: 2.6 % (ref 0.0–5.0)
HCT: 35.8 % — ABNORMAL LOW (ref 39.0–52.0)
Hemoglobin: 11.1 g/dL — ABNORMAL LOW (ref 13.0–17.0)
Lymphocytes Relative: 10 % — ABNORMAL LOW (ref 12.0–46.0)
Lymphs Abs: 0.7 10*3/uL (ref 0.7–4.0)
MCHC: 30.9 g/dL (ref 30.0–36.0)
MCV: 81.3 fL (ref 78.0–100.0)
Monocytes Absolute: 0.7 10*3/uL (ref 0.1–1.0)
Monocytes Relative: 11.4 % (ref 3.0–12.0)
Neutro Abs: 4.9 10*3/uL (ref 1.4–7.7)
Neutrophils Relative %: 75.6 % (ref 43.0–77.0)
Platelets: 242 10*3/uL (ref 150.0–400.0)
RBC: 4.4 Mil/uL (ref 4.22–5.81)
RDW: 23.6 % — ABNORMAL HIGH (ref 11.5–15.5)
WBC: 6.5 10*3/uL (ref 4.0–10.5)

## 2023-10-24 LAB — IBC + FERRITIN
Ferritin: 23.2 ng/mL (ref 22.0–322.0)
Iron: 69 ug/dL (ref 42–165)
Saturation Ratios: 20.9 % (ref 20.0–50.0)
TIBC: 330.4 ug/dL (ref 250.0–450.0)
Transferrin: 236 mg/dL (ref 212.0–360.0)

## 2023-10-24 NOTE — Patient Instructions (Signed)
Your provider has requested that you go to the basement level for lab work before leaving today. Press "B" on the elevator. The lab is located at the first door on the left as you exit the elevator.  _______________________________________________________  If your blood pressure at your visit was 140/90 or greater, please contact your primary care physician to follow up on this.  _______________________________________________________  If you are age 72 or older, your body mass index should be between 23-30. Your Body mass index is 30.92 kg/m. If this is out of the aforementioned range listed, please consider follow up with your Primary Care Provider.  If you are age 80 or younger, your body mass index should be between 19-25. Your Body mass index is 30.92 kg/m. If this is out of the aformentioned range listed, please consider follow up with your Primary Care Provider.   ________________________________________________________  The [facility] providers would like to encourage you to use [facility] to communicate with providers for non-urgent requests or questions.  Due to long hold times on the telephone, sending your provider a message by [facility] may be a faster and more efficient way to get a response.  Please allow 48 business hours for a response.  Please remember that this is for non-urgent requests.  _______________________________________________________

## 2023-10-24 NOTE — Progress Notes (Signed)
Chief Complaint: Follow-up hospitalization for iron deficiency anemia  HPI:    Keith [name] is a 73 year old African-American male, previously known to Dr. Russella Dar seen by Dr. Myrtie Neither at recent hospitalization, with a past medical history of CHF, diabetes, PE on Eliquis, chronic heart failure preserved EF with RV failure, spinal stenosis requiring numerous back surgeries, and multiple others, who was referred to me by [place], [name], MD for follow-up after hospitalization for iron deficiency anemia with GI bleed.      09/19/2023 patient consulted on the patient for GI bleed.  At the time hemoglobin 6.6.    09/20/2023 EGD with normal esophagus, erythematous mucosa in the stomach and normal duodenum.  Colonoscopy is scheduled for the next day.    09/21/2023 colonoscopy with hemorrhoids, two 3-4 mm polyps in the descending colon and cecum removed, diverticulosis in the sigmoid colon, localized inflammation in the rectum and nonbleeding external and internal hemorrhoids.  Patient was given Hydrocortisone suppositories for a week.  Recommend recheck of CBC 7 to 10 days after discharge.  If continued IDA would recommend video capsule endoscopy.  Eliquis restarted.    09/25/2023 CBC with a hemoglobin of 8.2 (8.3 on 09/24/2023).    10/22/2023 BMP with a BUN minimally elevated at 27 and a glucose of 116.    10/22/2023 patient seen by cardiology for follow-up after hospitalization.  Described feeling better at that point.  Continued on Torsemide and Spironolactone as well as Coreg.  He was back on Eliquis 5 mg twice daily.    Today, patient tells me he has been doing well.  He feels fine.  Tells me that he was previously on an iron supplement but stopped 3 months ago.  Apparently it is making him more constipated and making him see more blood in his stool.  Since he has stopped it and since recent hospitalization he has had no further blood in his stool.  Feels well.    Has been having some issues with insurance  company and mold/water damage in his home.    Denies fever, chills, weight loss, blood in the stool, change in bowel habits or abdominal pain.  Past Medical History:  Diagnosis Date   Allergy    CHF (congestive heart failure) (HCC)    Chronic back pain    Diabetes mellitus without complication (HCC)    DNR (do not resuscitate) 11/19/2020   Duodenal ulcer hemorrhage 08/29/2014   ED (erectile dysfunction)    Esophageal stricture 08/30/2014   Glucose intolerance (impaired glucose tolerance)    Hiatal hernia 08/30/2014   Hypertension    Hypokalemia 04/11/2013   MRSA carrier 08/30/2014   Obesity    Osteoarthritis    Pneumonia    Pulmonary embolism (HCC) 06/2020   Scoliosis 2016   Spinal stenosis of lumbar region    Urinary tract infection 04/11/2013    Past Surgical History:  Procedure Laterality Date   BACK SURGERY  08/07/2021   x 2   BIOPSY  09/20/2023   Procedure: BIOPSY;  Surgeon: Sherrilyn Rist, MD;  Location: [facility] ENDOSCOPY;  Service: Gastroenterology;;   BIOPSY  09/21/2023   Procedure: BIOPSY;  Surgeon: Shellia Cleverly, DO;  Location: [facility] ENDOSCOPY;  Service: Gastroenterology;;  rectal   COLONOSCOPY  2008   COLONOSCOPY WITH PROPOFOL N/A 09/21/2023   Procedure: COLONOSCOPY WITH PROPOFOL;  Surgeon: Shellia Cleverly, DO;  Location: [facility] ENDOSCOPY;  Service: Gastroenterology;  Laterality: N/A;   ESOPHAGOGASTRODUODENOSCOPY N/A 08/29/2014   Procedure: ESOPHAGOGASTRODUODENOSCOPY (EGD);  Surgeon: Hart Carwin, MD;  Location: [facility] ENDOSCOPY;  Service: Endoscopy;  Laterality: N/A;   ESOPHAGOGASTRODUODENOSCOPY (EGD) WITH PROPOFOL N/A 09/20/2023   Procedure: ESOPHAGOGASTRODUODENOSCOPY (EGD) WITH PROPOFOL;  Surgeon: Sherrilyn Rist, MD;  Location: [facility] ENDOSCOPY;  Service: Gastroenterology;  Laterality: N/A;   LAMINECTOMY N/A 07/28/2018   Procedure: THORACIC ELEVEN- THORACIC TWELVE POSTERIOR DECOMPRESSION LAMINECTOMY;  Surgeon: Jadene Pierini, MD;  Location: [facility] OR;  Service:  Neurosurgery;  Laterality: N/A;   LUMBAR LAMINECTOMY     NASAL POLYP SURGERY     ORIF ANKLE FRACTURE Right 07/01/2020   Procedure: OPEN REDUCTION INTERNAL FIXATION (ORIF) ANKLE FRACTURE. TRIMALLEOLAR;  Surgeon: Teryl Lucy, MD;  Location: [facility] OR;  Service: Orthopedics;  Laterality: Right;   POLYPECTOMY  09/21/2023   Procedure: POLYPECTOMY;  Surgeon: Shellia Cleverly, DO;  Location: [facility] ENDOSCOPY;  Service: Gastroenterology;;  cecal   RIGHT HEART CATH N/A 07/06/2022   Procedure: RIGHT HEART CATH;  Surgeon: Laurey Morale, MD;  Location: [facility] INVASIVE CV LAB;  Service: Cardiovascular;  Laterality: N/A;   RIGHT/LEFT HEART CATH AND CORONARY ANGIOGRAPHY N/A 03/02/2022   Procedure: RIGHT/LEFT HEART CATH AND CORONARY ANGIOGRAPHY;  Surgeon: Orpah Cobb, MD;  Location: [facility] INVASIVE CV LAB;  Service: Cardiovascular;  Laterality: N/A;   TONSILLECTOMY      Current Outpatient Medications  Medication Sig Dispense Refill   carvedilol (COREG) 12.5 MG tablet TAKE 1 TABLET BY MOUTH TWICE  DAILY WITH MEALS 200 tablet 2   cholecalciferol (VITAMIN D3) 25 MCG (1000 UNIT) tablet Take 1,000 Units by mouth daily.     DULoxetine (CYMBALTA) 30 MG capsule Take 1 capsule (30 mg total) by mouth daily. 60 capsule 0   ELIQUIS 5 MG TABS tablet TAKE 1 TABLET BY MOUTH TWICE A DAY 60 tablet 11   ferrous sulfate 325 (65 FE) MG tablet TAKE 1 TABLET BY MOUTH EVERY DAY WITH BREAKFAST (Patient taking differently: Take 325 mg by mouth daily as needed.) 90 tablet 1   gabapentin (NEURONTIN) 800 MG tablet Take 1 tablet (800 mg total) by mouth 3 (three) times daily for neuropathy. 90 tablet 0   HYDROcodone-acetaminophen (NORCO) 7.5-325 MG tablet Take 1 tablet by mouth in the morning, at noon, in the evening, and at bedtime. 7.5 mg/375 mg (Patient taking differently: Take 1 tablet by mouth in the morning, at noon, in the evening, and at bedtime. 7.5 mg/375 mg as needed) 10 tablet 0   HYDROcodone-acetaminophen (NORCO) 7.5-325 MG tablet  Take 1 tablet by mouth 4 (four) times daily as needed for pain. 120 tablet 0   levETIRAcetam (KEPPRA) 250 MG tablet TAKE 1 TABLET BY MOUTH TWICE  DAILY 180 tablet 2   lidocaine (LIDODERM) 5 % Place 1 patch onto the skin daily as needed (pain). Remove & Discard patch within 12 hours or as directed by MD     macitentan (OPSUMIT) 10 MG tablet Take 10 mg by mouth daily.     naloxone (NARCAN) nasal spray 4 mg/0.1 mL Place 1 spray into the nose as needed. 1 each 3   omeprazole (PRILOSEC) 40 MG capsule TAKE 1 CAPSULE BY MOUTH DAILY 100 capsule 2   polyethylene glycol (MIRALAX / GLYCOLAX) 17 g packet Take 17 g by mouth daily as needed for mild constipation.     Riociguat (ADEMPAS) 2 MG TABS Take 2 mg by mouth in the morning, at noon, and at bedtime. 90 tablet 11   rOPINIRole (REQUIP) 1 MG tablet TAKE 1 TABLET BY MOUTH AT  BEDTIME  100 tablet 2   rosuvastatin (CRESTOR) 20 MG tablet TAKE 1 TABLET BY MOUTH DAILY 100 tablet 2   senna-docusate (SENOKOT-S) 8.6-50 MG tablet Take 2 tablets by mouth at bedtime. 60 tablet 0   spironolactone (ALDACTONE) 25 MG tablet TAKE 1 TABLET BY MOUTH DAILY 100 tablet 2   torsemide 40 MG TABS Take 60 mg by mouth daily. 30 tablet 0   traZODone (DESYREL) 50 MG tablet TAKE 1/2 TO 1 TABLET BY MOUTH AT BEDTIME AS NEEDED FOR SLEEP 90 tablet 1   vitamin C (ASCORBIC ACID) 250 MG tablet Take 250 mg by mouth daily.     No current facility-administered medications for this visit.    Allergies as of 10/24/2023 - Review Complete 10/22/2023  Allergen Reaction Noted   Aspirin Other (See Comments) 11/19/2020   Lisinopril Other (See Comments) 02/12/2017    Family History  Problem Relation Age of Onset   Diabetes Mother    Asthma Mother    Breast cancer Mother    Colon polyps Mother    Hypertension Father    Stroke Father    Heart failure Brother    Stroke Brother    Ovarian cancer Maternal Grandmother    Hypertension Maternal Grandfather    Heart attack Maternal Grandfather     Colon cancer Neg Hx     Social History   Socioeconomic History   Marital status: Married    Spouse name: Wynona Canes   Number of children: 3   Years of education: 4 years college   Highest education level: Bachelor's degree (e.g., BA, AB, BS)  Occupational History   Occupation: retired  Tobacco Use   Smoking status: Former    Current packs/day: 0.00    Average packs/day: 1 pack/day for 25.0 years (25.0 ttl pk-yrs)    Types: Cigarettes    Start date: 59    Quit date: 1995    Years since quitting: 30.0    Passive exposure: Never   Smokeless tobacco: Never  Vaping Use   Vaping status: Never Used  Substance and Sexual Activity   Alcohol use: Yes    Comment: holidays and special occasions   Drug use: Not Currently   Sexual activity: Yes    Birth control/protection: None  Other Topics Concern   Not on file  Social History Narrative   Married, 5 kids, 14 grand, 2 great grand, watch TV, Armed forces operational officer, traveling   Social Drivers of Corporate investment banker Strain: Low Risk  (03/16/2023)   Overall Financial Resource Strain (CARDIA)    Difficulty of Paying Living Expenses: Not hard at all  Food Insecurity: No Food Insecurity (09/18/2023)   Hunger Vital Sign    Worried About Running Out of Food in the Last Year: Never true    Ran Out of Food in the Last Year: Never true  Transportation Needs: No Transportation Needs (09/18/2023)   PRAPARE - Administrator, Civil Service (Medical): No    Lack of Transportation (Non-Medical): No  Physical Activity: Insufficiently Active (03/16/2023)   Exercise Vital Sign    Days of Exercise per Week: 3 days    Minutes of Exercise per Session: 30 min  Stress: No Stress Concern Present (03/16/2023)   [employer] of Occupational Health - Occupational Stress Questionnaire    Feeling of Stress : Not at all  Social Connections: Moderately Integrated (03/16/2023)   Social Connection and Isolation Panel [NHANES]    Frequency  of Communication with Friends and Family:  More than three times a week    Frequency of Social Gatherings with Friends and Family: Twice a week    Attends Religious Services: More than 4 times per year    Active Member of [employer] or Organizations: No    Attends Engineer, structural: Not on file    Marital Status: Married  Catering manager Violence: Not At Risk (09/18/2023)   Humiliation, Afraid, Rape, and Kick questionnaire    Fear of Current or Ex-Partner: No    Emotionally Abused: No    Physically Abused: No    Sexually Abused: No    Review of Systems:    Constitutional: No weight loss, fever or chills Cardiovascular: No chest pain  Respiratory: No SOB  Gastrointestinal: See HPI and otherwise negative   Physical Exam:  Vital signs: BP 124/72   Pulse 96   Ht 5\' 10"  (1.778 m)   BMI 30.92 kg/m    Constitutional:   Pleasant elderly AA male appears to be in NAD, Well developed, Well nourished, alert and cooperative Respiratory: Respirations even and unlabored. Lungs clear to auscultation bilaterally.   No wheezes, crackles, or rhonchi.  Cardiovascular: Normal S1, S2. No MRG. Regular rate and rhythm. No peripheral edema, cyanosis or pallor.  Gastrointestinal:  Soft, nondistended, nontender. No rebound or guarding. Normal bowel sounds. No appreciable masses or hepatomegaly. Rectal:  Not performed.  Msk:  Symmetrical without gross deformities. Without edema, no deformity or joint abnormality. In wheelchair Psychiatric: Oriented to person, place and time. Demonstrates good judgement and reason without abnormal affect or behaviors.  RELEVANT LABS AND IMAGING: CBC    Component Value Date/Time   WBC 4.6 09/25/2023 0613   RBC 3.40 (L) 09/25/2023 0613   HGB 8.2 (L) 09/25/2023 0613   HCT 27.7 (L) 09/25/2023 0613   PLT 289 09/25/2023 0613   MCV 81.5 09/25/2023 0613   MCH 24.1 (L) 09/25/2023 0613   MCHC 29.6 (L) 09/25/2023 0613   RDW 17.0 (H) 09/25/2023 0613   LYMPHSABS 0.4  (L) 09/23/2023 0620   MONOABS 1.0 09/23/2023 0620   EOSABS 0.2 09/23/2023 0620   BASOSABS 0.0 09/23/2023 0620    CMP     Component Value Date/Time   NA 140 10/22/2023 1241   K 3.9 10/22/2023 1241   CL 108 10/22/2023 1241   CO2 23 10/22/2023 1241   GLUCOSE 116 (H) 10/22/2023 1241   GLUCOSE 100 (H) 09/09/2006 0956   BUN 27 (H) 10/22/2023 1241   CREATININE 0.99 10/22/2023 1241   CREATININE 1.70 (H) 03/09/2022 1542   CALCIUM 10.2 10/22/2023 1241   PROT 6.6 09/20/2023 0743   ALBUMIN 3.0 (L) 09/23/2023 0620   AST 17 09/20/2023 0743   ALT 14 09/20/2023 0743   ALKPHOS 57 09/20/2023 0743   BILITOT 0.8 09/20/2023 0743   GFRNONAA >60 10/22/2023 1241   GFRAA >60 07/11/2020 0619    Assessment: 1.  Iron deficiency anemia: Recent EGD and colonoscopy during hospitalization, no etiology found, hemoglobin had stabilized but has not been checked over the past month, will recheck today, no further reported bleeding; consider small bowel bleed versus hemorrhoids versus other in the face of anticoagulation with Eliquis 2.  Chronic diastolic heart failure: recently followed with cardiology and continues on diuretics, seems stable at follow-up 10/22/2023 3.  OSA 4.  History of PE/DVT: On Eliquis 5.  Spinal stenosis: Limited mobility at baseline  Plan: 1.  Recheck CBC and iron studies today. 2.  Patient will follow-up with [place] per recommendations after  labs.  If need to restart iron supplementation would recommend daily MiraLAX to combat constipation. 3.  For patient continuity will assign patient to Dr. Myrtie Neither today as he recently saw him in the hospital and Dr. Russella Dar is retired.  Hyacinth Meeker, PA-C [facility] 10/24/2023, 10:37 AM  Cc: [place], [name], MD

## 2023-10-24 NOTE — Progress Notes (Signed)
____________________________________________________________  Attending physician addendum:  Thank you for sending this case to me. I have reviewed the entire note and agree with the plan with the following change:  If he develops recurrence of iron deficiency, please make arrangements for him to get IV iron instead of resuming oral iron that causes him constipation.  Amada Jupiter, MD  ____________________________________________________________

## 2023-10-29 ENCOUNTER — Other Ambulatory Visit: Payer: Self-pay | Admitting: Family Medicine

## 2023-10-29 DIAGNOSIS — I10 Essential (primary) hypertension: Secondary | ICD-10-CM

## 2023-10-30 ENCOUNTER — Other Ambulatory Visit (HOSPITAL_COMMUNITY): Payer: Self-pay

## 2023-10-30 DIAGNOSIS — R531 Weakness: Secondary | ICD-10-CM | POA: Diagnosis not present

## 2023-10-30 DIAGNOSIS — G8929 Other chronic pain: Secondary | ICD-10-CM | POA: Diagnosis not present

## 2023-10-30 DIAGNOSIS — Z79899 Other long term (current) drug therapy: Secondary | ICD-10-CM | POA: Diagnosis not present

## 2023-10-30 MED ORDER — HYDROCODONE-ACETAMINOPHEN 7.5-325 MG PO TABS
1.0000 | ORAL_TABLET | Freq: Four times a day (QID) | ORAL | 0 refills | Status: DC
  Filled 2023-10-30: qty 56, 14d supply, fill #0

## 2023-10-31 ENCOUNTER — Other Ambulatory Visit: Payer: Self-pay | Admitting: Family Medicine

## 2023-10-31 DIAGNOSIS — K219 Gastro-esophageal reflux disease without esophagitis: Secondary | ICD-10-CM

## 2023-11-04 ENCOUNTER — Other Ambulatory Visit (HOSPITAL_COMMUNITY): Payer: Self-pay

## 2023-11-05 ENCOUNTER — Other Ambulatory Visit (HOSPITAL_COMMUNITY): Payer: Self-pay

## 2023-11-05 ENCOUNTER — Other Ambulatory Visit: Payer: Self-pay

## 2023-11-05 DIAGNOSIS — M25551 Pain in right hip: Secondary | ICD-10-CM | POA: Diagnosis not present

## 2023-11-05 DIAGNOSIS — I10 Essential (primary) hypertension: Secondary | ICD-10-CM | POA: Diagnosis not present

## 2023-11-05 DIAGNOSIS — Z79899 Other long term (current) drug therapy: Secondary | ICD-10-CM | POA: Diagnosis not present

## 2023-11-05 DIAGNOSIS — G8929 Other chronic pain: Secondary | ICD-10-CM | POA: Diagnosis not present

## 2023-11-05 DIAGNOSIS — M5489 Other dorsalgia: Secondary | ICD-10-CM | POA: Diagnosis not present

## 2023-11-05 DIAGNOSIS — Z9181 History of falling: Secondary | ICD-10-CM | POA: Diagnosis not present

## 2023-11-05 DIAGNOSIS — M25571 Pain in right ankle and joints of right foot: Secondary | ICD-10-CM | POA: Diagnosis not present

## 2023-11-05 MED ORDER — GABAPENTIN 800 MG PO TABS
800.0000 mg | ORAL_TABLET | Freq: Three times a day (TID) | ORAL | 0 refills | Status: DC
  Filled 2023-11-05: qty 90, 30d supply, fill #0

## 2023-11-05 MED ORDER — NALOXONE HCL 4 MG/0.1ML NA LIQD
1.0000 | Freq: Once | NASAL | 3 refills | Status: DC | PRN
  Filled 2023-11-05: qty 2, 1d supply, fill #0

## 2023-11-05 MED ORDER — HYDROCODONE-ACETAMINOPHEN 7.5-325 MG PO TABS
1.0000 | ORAL_TABLET | Freq: Four times a day (QID) | ORAL | 0 refills | Status: DC | PRN
  Filled 2023-11-05: qty 120, 30d supply, fill #0

## 2023-11-06 ENCOUNTER — Other Ambulatory Visit (HOSPITAL_COMMUNITY): Payer: Self-pay

## 2023-11-08 DIAGNOSIS — L89153 Pressure ulcer of sacral region, stage 3: Secondary | ICD-10-CM | POA: Diagnosis not present

## 2023-11-09 DIAGNOSIS — G4733 Obstructive sleep apnea (adult) (pediatric): Secondary | ICD-10-CM | POA: Diagnosis not present

## 2023-12-03 ENCOUNTER — Other Ambulatory Visit (HOSPITAL_COMMUNITY): Payer: Self-pay

## 2023-12-03 DIAGNOSIS — Z79899 Other long term (current) drug therapy: Secondary | ICD-10-CM | POA: Diagnosis not present

## 2023-12-03 DIAGNOSIS — R7303 Prediabetes: Secondary | ICD-10-CM | POA: Diagnosis not present

## 2023-12-03 DIAGNOSIS — Z0000 Encounter for general adult medical examination without abnormal findings: Secondary | ICD-10-CM | POA: Diagnosis not present

## 2023-12-03 DIAGNOSIS — I10 Essential (primary) hypertension: Secondary | ICD-10-CM | POA: Diagnosis not present

## 2023-12-03 DIAGNOSIS — M25551 Pain in right hip: Secondary | ICD-10-CM | POA: Diagnosis not present

## 2023-12-03 DIAGNOSIS — Z9181 History of falling: Secondary | ICD-10-CM | POA: Diagnosis not present

## 2023-12-03 DIAGNOSIS — G8929 Other chronic pain: Secondary | ICD-10-CM | POA: Diagnosis not present

## 2023-12-03 DIAGNOSIS — M25571 Pain in right ankle and joints of right foot: Secondary | ICD-10-CM | POA: Diagnosis not present

## 2023-12-03 DIAGNOSIS — M5489 Other dorsalgia: Secondary | ICD-10-CM | POA: Diagnosis not present

## 2023-12-03 MED ORDER — HYDROCODONE-ACETAMINOPHEN 7.5-325 MG PO TABS
1.0000 | ORAL_TABLET | Freq: Four times a day (QID) | ORAL | 0 refills | Status: DC | PRN
  Filled 2023-12-04 – 2023-12-06 (×2): qty 120, 30d supply, fill #0

## 2023-12-03 MED ORDER — GABAPENTIN 800 MG PO TABS
800.0000 mg | ORAL_TABLET | Freq: Three times a day (TID) | ORAL | 0 refills | Status: DC
  Filled 2023-12-03: qty 90, 30d supply, fill #0

## 2023-12-04 ENCOUNTER — Other Ambulatory Visit (HOSPITAL_COMMUNITY): Payer: Self-pay

## 2023-12-06 ENCOUNTER — Other Ambulatory Visit (HOSPITAL_COMMUNITY): Payer: Self-pay

## 2023-12-06 ENCOUNTER — Other Ambulatory Visit: Payer: Self-pay

## 2023-12-06 DIAGNOSIS — L89153 Pressure ulcer of sacral region, stage 3: Secondary | ICD-10-CM | POA: Diagnosis not present

## 2023-12-06 DIAGNOSIS — Z79899 Other long term (current) drug therapy: Secondary | ICD-10-CM | POA: Diagnosis not present

## 2023-12-07 DIAGNOSIS — G4733 Obstructive sleep apnea (adult) (pediatric): Secondary | ICD-10-CM | POA: Diagnosis not present

## 2023-12-13 ENCOUNTER — Other Ambulatory Visit (HOSPITAL_COMMUNITY): Payer: Self-pay

## 2023-12-13 MED ORDER — DOXYCYCLINE HYCLATE 100 MG PO CAPS
100.0000 mg | ORAL_CAPSULE | Freq: Two times a day (BID) | ORAL | 0 refills | Status: AC
  Filled 2023-12-13: qty 14, 7d supply, fill #0

## 2023-12-14 ENCOUNTER — Other Ambulatory Visit: Payer: Self-pay | Admitting: Family Medicine

## 2023-12-14 DIAGNOSIS — G894 Chronic pain syndrome: Secondary | ICD-10-CM

## 2023-12-30 ENCOUNTER — Encounter (HOSPITAL_COMMUNITY): Payer: Self-pay | Admitting: Cardiology

## 2023-12-30 ENCOUNTER — Telehealth (HOSPITAL_COMMUNITY): Payer: Self-pay

## 2023-12-30 ENCOUNTER — Ambulatory Visit (HOSPITAL_COMMUNITY)
Admission: RE | Admit: 2023-12-30 | Discharge: 2023-12-30 | Disposition: A | Discharge status: Home / Self Care | Payer: Medicare Other | Source: Ambulatory Visit | Attending: Cardiology | Admitting: Cardiology

## 2023-12-30 VITALS — BP 120/70 | HR 79

## 2023-12-30 DIAGNOSIS — E1122 Type 2 diabetes mellitus with diabetic chronic kidney disease: Secondary | ICD-10-CM | POA: Insufficient documentation

## 2023-12-30 DIAGNOSIS — Z87891 Personal history of nicotine dependence: Secondary | ICD-10-CM | POA: Insufficient documentation

## 2023-12-30 DIAGNOSIS — I5032 Chronic diastolic (congestive) heart failure: Secondary | ICD-10-CM | POA: Diagnosis not present

## 2023-12-30 DIAGNOSIS — Z86711 Personal history of pulmonary embolism: Secondary | ICD-10-CM | POA: Diagnosis not present

## 2023-12-30 DIAGNOSIS — I2721 Secondary pulmonary arterial hypertension: Secondary | ICD-10-CM | POA: Insufficient documentation

## 2023-12-30 DIAGNOSIS — M549 Dorsalgia, unspecified: Secondary | ICD-10-CM | POA: Insufficient documentation

## 2023-12-30 DIAGNOSIS — Z79899 Other long term (current) drug therapy: Secondary | ICD-10-CM | POA: Insufficient documentation

## 2023-12-30 DIAGNOSIS — I071 Rheumatic tricuspid insufficiency: Secondary | ICD-10-CM | POA: Diagnosis not present

## 2023-12-30 DIAGNOSIS — I130 Hypertensive heart and chronic kidney disease with heart failure and stage 1 through stage 4 chronic kidney disease, or unspecified chronic kidney disease: Secondary | ICD-10-CM | POA: Diagnosis not present

## 2023-12-30 DIAGNOSIS — G4733 Obstructive sleep apnea (adult) (pediatric): Secondary | ICD-10-CM | POA: Diagnosis not present

## 2023-12-30 DIAGNOSIS — Z7901 Long term (current) use of anticoagulants: Secondary | ICD-10-CM | POA: Diagnosis not present

## 2023-12-30 DIAGNOSIS — E669 Obesity, unspecified: Secondary | ICD-10-CM | POA: Insufficient documentation

## 2023-12-30 DIAGNOSIS — Z86718 Personal history of other venous thrombosis and embolism: Secondary | ICD-10-CM | POA: Diagnosis not present

## 2023-12-30 DIAGNOSIS — N183 Chronic kidney disease, stage 3 unspecified: Secondary | ICD-10-CM | POA: Insufficient documentation

## 2023-12-30 DIAGNOSIS — M4800 Spinal stenosis, site unspecified: Secondary | ICD-10-CM | POA: Diagnosis not present

## 2023-12-30 DIAGNOSIS — Z993 Dependence on wheelchair: Secondary | ICD-10-CM | POA: Insufficient documentation

## 2023-12-30 LAB — BASIC METABOLIC PANEL
Anion gap: 12 (ref 5–15)
BUN: 19 mg/dL (ref 8–23)
CO2: 22 mmol/L (ref 22–32)
Calcium: 10.4 mg/dL — ABNORMAL HIGH (ref 8.9–10.3)
Chloride: 107 mmol/L (ref 98–111)
Creatinine, Ser: 1.2 mg/dL (ref 0.61–1.24)
GFR, Estimated: >60 mL/min (ref >60)
Glucose, Bld: 128 mg/dL — ABNORMAL HIGH (ref 70–99)
Potassium: 3.8 mmol/L (ref 3.5–5.1)
Sodium: 141 mmol/L (ref 135–145)

## 2023-12-30 LAB — CBC
HCT: 39.6 % (ref 39.0–52.0)
Hemoglobin: 12.9 g/dL — ABNORMAL LOW (ref 13.0–17.0)
MCH: 28.9 pg (ref 26.0–34.0)
MCHC: 32.6 g/dL (ref 30.0–36.0)
MCV: 88.6 fL (ref 80.0–100.0)
Platelets: 235 10*3/uL (ref 150–400)
RBC: 4.47 MIL/uL (ref 4.22–5.81)
RDW: 17.3 % — ABNORMAL HIGH (ref 11.5–15.5)
WBC: 5.1 10*3/uL (ref 4.0–10.5)
nRBC: 0 % (ref 0.0–0.2)

## 2023-12-30 LAB — LIPID PANEL
Cholesterol: 130 mg/dL (ref 0–200)
HDL: 55 mg/dL (ref >40)
LDL Cholesterol: 66 mg/dL (ref 0–99)
Total CHOL/HDL Ratio: 2.4 ratio
Triglycerides: 45 mg/dL (ref <150)
VLDL: 9 mg/dL (ref 0–40)

## 2023-12-30 LAB — BRAIN NATRIURETIC PEPTIDE: B Natriuretic Peptide: 26.4 pg/mL (ref 0.0–100.0)

## 2023-12-30 NOTE — Patient Instructions (Signed)
 There has been no changes to your medications.  Labs done today, your results will be available in MyChart, we will contact you for abnormal readings.  Your physician has requested that you have an echocardiogram. Echocardiography is a painless test that uses sound waves to create images of your heart. It provides your doctor with information about the size and shape of your heart and how well your heart's chambers and valves are working. This procedure takes approximately one hour. There are no restrictions for this procedure. Please do NOT wear cologne, perfume, aftershave, or lotions (deodorant is allowed). Please arrive 15 minutes prior to your appointment time.  Please note: We ask at that you not bring children with you during ultrasound (echo/ vascular) testing. Due to room size and safety concerns, children are not allowed in the ultrasound rooms during exams. Our front office staff cannot provide observation of children in our lobby area while testing is being conducted. An adult accompanying a patient to their appointment will only be allowed in the ultrasound room at the discretion of the ultrasound technician under special circumstances. We apologize for any inconvenience.  We will refer you to [facility] Hopefully you will hear from them soon.  Your physician recommends that you schedule a follow-up appointment in: 4 months.  If you have any questions or concerns before your next appointment please send [place] a message through [place] or call our office at [phone].    TO LEAVE A MESSAGE FOR THE NURSE SELECT OPTION 2, PLEASE LEAVE A MESSAGE INCLUDING: YOUR NAME DATE OF BIRTH CALL BACK NUMBER REASON FOR CALL**this is important as we prioritize the call backs  YOU WILL RECEIVE A CALL BACK THE SAME DAY AS LONG AS YOU CALL BEFORE 4:00 PM  At the [facility], you and your health needs are our priority. As part of our continuing mission to provide you with exceptional  heart care, we have created designated Provider Care Teams. These Care Teams include your primary Cardiologist (physician) and Advanced Practice Providers (APPs- Physician Assistants and Nurse Practitioners) who all work together to provide you with the care you need, when you need it.   You may see any of the following providers on your designated Care Team at your next follow up: Dr Arvilla Meres Dr Marca Ancona Dr. Dorthula Nettles Dr. Clearnce Hasten Amy Filbert Schilder, NP Robbie Lis, [facility], [place] Brynda Peon, NP [place] Lee, NP Clarisa Kindred, NP Karle Plumber, PharmD Enos Fling, PharmD   Please be sure to bring in all your medications bottles to every appointment.    Thank you for choosing [facility]

## 2023-12-30 NOTE — Telephone Encounter (Signed)
 Referral Faxed to [facility] to the [facility]rombo[facility]. This is the third attempt of referral

## 2023-12-30 NOTE — Progress Notes (Signed)
 ADVANCED HF CLINIC NOTE  Primary Care: [place], [name], MD HF Cardiologist: Dr. [name]  Chief Complaint: CHF   HPI: Keith [name] is a 73 y.o.male with history saddle PE/LLE DVT 09/21 s/p TPA complicated by hypotension requiring Levophed, multiple prior back surgeries, left adrenal adenoma, DM II, obesity, HTN.   Admitted 03/01/22 with CP. He was hypoxic requiring supplemental oxygen. V/Q scan with moderate to high probability of PE.  Lifelong DOAC recommended, started on Eliquis 5 BID (had been off eliquis since 02/22).   Echo in 5/23 showed EF 55-60%, mild LVH, interventricular septum flattened in systole and diastole consistent with RV pressure and volume overload, RV severely enlarged with severely reduced function, RVSP 75 mmH, moderate to severe TR.   RHC/LHC in 5/23 showed no CAD, elevated right and left heart filling pressures, and pulmonary venous hypertension.  He was seen by CT surgery. Felt to be high risk for pulmonary embolectomy. Course complicated by AKI, Scr peaked at 3.34 and improved to 1.60 (baseline). Started on torsemide 20 mg M, W, F at discharge. Atenolol/HTCZ stopped.   Echo in 9/23 showed EF 55-60%, mildly decreased RV function with mild-moderate RV enlargement, unable to estimate PA systolic pressure, IVC normal. RHC was done in 10/23, showing moderate pulmonary arterial hypertension with PVR 5 WU.    V/Q scan in 7/24 showed right lung findings suggestive of chronic PE.   Follow up 8/24, volume stable. Opsumit 10 mg daily added, referred to [facility] for consideration of balloon pulmonary angioplasty (would not be good candidate for pulmonary thromboendarterectomy).   Echo 8/24 showed hyperdynamic LV with EF 70-75%, RV read as normal.   Seen for follow up 09/18/23 feeling fair, WC bound. Labs showed hgb 8.5>6.6. He was called and advised to go to the ED for eval. He was admitted for GI bleeding. Eliquis held. Underwent EGD/colonoscopy showing erythematous mucosa;  2  polys that were removed, sigmoid colon diverticulosis, localized inflammation involving the distal rectum that was biopsied, nonbleeding external and internal hemorrhoids, normal ileum and no blood noted throughout the GI lumen.   Today he returns for followup of CHF.  No BRBPR/melena. He is doing PT.  He is generally wheelchair-bound due to spinal stenosis.  He can walk in the house with a walker for about 100 feet. Limited by back pain and not dyspnea.  No chest pain.  No lightheadedness. He is using CPAP some of the time.   ECG (personally reviewed): NSR, iRBBB, RVH  Labs (1/24): K 3.8, creatinine 1.03 Labs (3/24): BNP 17, K 4.1, creatinine 1.36 Labs (9/24): K 4.1, creatinine 1.31. Labs (12/24): K 3.7, creatinine 1.06, hgb 8.2 Labs (1/25): hgb 11.1, BNP 25, K 3.9, creatinine 0.99  PMH: 1. Type 2 diabetes 2. Left adrenal adenoma 3. Obesity 4. HTN 5. H/o back surgery for spinal stenosis: Has had 3 surgeries, actually could not walk after the third surgery but has been doing PT.  6. Venous thromboembolism: Saddle PE in 9/21 with LLE DVT, this was treated with TPA.   - Recurrent PE in 5/23 after stopping DOAC.  - Echo (5/23): EF 55-60%, mild LVH, interventricular septum flattened in systole and diastole consistent with RV pressure and volume overload, RV severely enlarged with severely reduced function, RVSP 75 mmH, moderate to severe TR.  - R/LHC (5/23): RA 12, PA 48/34 (39), PCWP mean 40, V wave 42, Fick CO/CI 5.93/2.33, PVR ?< 1 WU.  - Echo (9/23): EF 55-60%, mildly decreased RV function with mild-moderate RV enlargement,  unable to estimate PA systolic pressure, IVC normal.  - RHC (10/23): mean RA 9, PA 60/30 mean 36, mean PCWP 6, CI 2.46, PVR 5 WU - CTA chest (1/24): No PE - ANA/anti-cardiolipid/beta-2 glycoprotein/CCP negative, ESR 50, RF 42 (2/24) - V/Q scan (7/24): right lung findings suggestive of chronic PE.  - Echo (8/24): EF hyperdynamic LV with EF 70-75%, RV read as normal.  7.  CKD stage 3 8. OSA: Uses CPAP.   Review of Systems: All systems reviewed and negative except as per HPI.   Current Outpatient Medications  Medication Sig Dispense Refill   amLODipine (NORVASC) 10 MG tablet TAKE 1 TABLET BY MOUTH DAILY 100 tablet 2   carvedilol (COREG) 12.5 MG tablet TAKE 1 TABLET BY MOUTH TWICE  DAILY WITH MEALS 200 tablet 2   cholecalciferol (VITAMIN D3) 25 MCG (1000 UNIT) tablet Take 1,000 Units by mouth daily.     DULoxetine (CYMBALTA) 30 MG capsule Take 1 capsule (30 mg total) by mouth daily. 60 capsule 0   ELIQUIS 5 MG TABS tablet TAKE 1 TABLET BY MOUTH TWICE A DAY 60 tablet 11   ferrous sulfate 325 (65 FE) MG tablet TAKE 1 TABLET BY MOUTH EVERY DAY WITH BREAKFAST 90 tablet 1   gabapentin (NEURONTIN) 800 MG tablet Take 1 tablet (800 mg total) by mouth 3 (three) times daily. 90 tablet 0   HYDROcodone-acetaminophen (NORCO) 7.5-325 MG tablet Take 1 tablet by mouth 4 (four) times daily as needed for pain. 120 tablet 0   levETIRAcetam (KEPPRA) 250 MG tablet TAKE 1 TABLET BY MOUTH TWICE  DAILY 200 tablet 2   lidocaine (LIDODERM) 5 % Place 1 patch onto the skin daily as needed (pain). Remove & Discard patch within 12 hours or as directed by MD     macitentan (OPSUMIT) 10 MG tablet Take 10 mg by mouth daily.     naloxone (NARCAN) nasal spray 4 mg/0.1 mL Place 1 spray into the nose once as needed 2 each 3   omeprazole (PRILOSEC) 40 MG capsule TAKE 1 CAPSULE BY MOUTH DAILY 100 capsule 2   polyethylene glycol (MIRALAX / GLYCOLAX) 17 g packet Take 17 g by mouth daily as needed for mild constipation.     Riociguat (ADEMPAS) 2 MG TABS Take 2 mg by mouth in the morning, at noon, and at bedtime. 90 tablet 11   rOPINIRole (REQUIP) 1 MG tablet TAKE 1 TABLET BY MOUTH AT  BEDTIME 100 tablet 2   rosuvastatin (CRESTOR) 20 MG tablet TAKE 1 TABLET BY MOUTH DAILY 100 tablet 2   senna-docusate (SENOKOT-S) 8.6-50 MG tablet Take 2 tablets by mouth at bedtime. 60 tablet 0   spironolactone  (ALDACTONE) 25 MG tablet TAKE 1 TABLET BY MOUTH DAILY 100 tablet 2   torsemide 40 MG TABS Take 60 mg by mouth daily. 30 tablet 0   traZODone (DESYREL) 50 MG tablet TAKE 1/2 TO 1 TABLET BY MOUTH AT BEDTIME AS NEEDED FOR SLEEP 90 tablet 1   vitamin C (ASCORBIC ACID) 250 MG tablet Take 250 mg by mouth daily.     No current facility-administered medications for this encounter.   Allergies  Allergen Reactions   Aspirin Other (See Comments)    Irritates the stomach and the patient developed ulcers, also   Lisinopril Other (See Comments)    Caused a body ache   Social History   Socioeconomic History   Marital status: Married    Spouse name: Wynona Canes   Number of children: 3  Years of education: 4 years college   Highest education level: Bachelor's degree (e.g., BA, AB, BS)  Occupational History   Occupation: retired  Tobacco Use   Smoking status: Former    Current packs/day: 0.00    Average packs/day: 1 pack/day for 25.0 years (25.0 ttl pk-yrs)    Types: Cigarettes    Start date: 3    Quit date: 1995    Years since quitting: 30.2    Passive exposure: Never   Smokeless tobacco: Never  Vaping Use   Vaping status: Never Used  Substance and Sexual Activity   Alcohol use: Yes    Comment: holidays and special occasions   Drug use: Not Currently   Sexual activity: Yes    Birth control/protection: None  Other Topics Concern   Not on file  Social History Narrative   Married, 5 kids, 14 grand, 2 great grand, watch TV, Armed forces operational officer, traveling   Social Drivers of Corporate investment banker Strain: Low Risk  (03/16/2023)   Overall Financial Resource Strain (CARDIA)    Difficulty of Paying Living Expenses: Not hard at all  Food Insecurity: No Food Insecurity (09/18/2023)   Hunger Vital Sign    Worried About Running Out of Food in the Last Year: Never true    Ran Out of Food in the Last Year: Never true  Transportation Needs: No Transportation Needs (09/18/2023)    PRAPARE - Administrator, Civil Service (Medical): No    Lack of Transportation (Non-Medical): No  Physical Activity: Insufficiently Active (03/16/2023)   Exercise Vital Sign    Days of Exercise per Week: 3 days    Minutes of Exercise per Session: 30 min  Stress: No Stress Concern Present (03/16/2023)   [employer] of Occupational Health - Occupational Stress Questionnaire    Feeling of Stress : Not at all  Social Connections: Moderately Integrated (03/16/2023)   Social Connection and Isolation Panel [NHANES]    Frequency of Communication with Friends and Family: More than three times a week    Frequency of Social Gatherings with Friends and Family: Twice a week    Attends Religious Services: More than 4 times per year    Active Member of [employer] or Organizations: No    Attends Engineer, structural: Not on file    Marital Status: Married  Catering manager Violence: Not At Risk (09/18/2023)   Humiliation, Afraid, Rape, and Kick questionnaire    Fear of Current or Ex-Partner: No    Emotionally Abused: No    Physically Abused: No    Sexually Abused: No   Family History  Problem Relation Age of Onset   Diabetes Mother    Asthma Mother    Breast cancer Mother    Colon polyps Mother    Hypertension Father    Stroke Father    Heart failure Brother    Stroke Brother    Ovarian cancer Maternal Grandmother    Hypertension Maternal Grandfather    Heart attack Maternal Grandfather    Colon cancer Neg Hx    BP 120/70   Pulse 79   SpO2 97%   Wt Readings from Last 3 Encounters:  10/22/23 97.8 kg (215 lb 8 oz)  09/25/23 100.9 kg (222 lb 7.1 oz)  04/03/23 120 kg (264 lb 8 oz)   PHYSICAL EXAM: General: NAD Neck: No JVD, no thyromegaly or thyroid nodule.  Lungs: Clear to auscultation bilaterally with normal respiratory effort. CV: Nondisplaced PMI.  Heart  regular S1/S2 with widely split S2, no S3/S4, no murmur.  1+ edema 1/2 to knees.  No carotid bruit.  Normal  pedal pulses.  Abdomen: Soft, nontender, no hepatosplenomegaly, no distention.  Skin: Intact without lesions or rashes.  Neurologic: Alert and oriented x 3.  Psych: Normal affect. Extremities: No clubbing or cyanosis.  HEENT: Normal.   ASSESSMENT & PLAN: 1.  Chronic diastolic CHF with RV failure:  Suspect RV strain from prior PEs.  Echo in 5/23 showed EF 55-60%, RV severely reduced, RVSP 75 mmHg, moderate to severe TR.  Echo in 9/23 showed EF 55-60%, mildly decreased RV function with mild-moderate RV enlargement, unable to estimate PA systolic pressure, IVC normal.  RHC in 10/23 showed moderate pulmonary arterial hypertension.  Echo (8/24) showed hyperdynamic LV with EF 70-75%, RV normal, unable to estimate PA pressure. I do not think that he is significantly volume overloaded.  Functional status is complicated by poor mobility due to spinal stenosis, NYHA class II-III.  - Continue torsemide to 60 mg daily. BMET/BNP today. - Continue spironolactone 25 mg daily.  - Continue Coreg 12.5 mg bid - Hold off on SGLT2i with recurrent UTIs.  - I will arrange for echo.  2. Pulmonary hypertension: RHC in 10/23 showed moderate pulmonary arterial hypertension.  Based on history, this seems most likely to be chronic thromboembolic pulmonary hypertension.  He was found to have elevations in RF and ESR but negative CCP antibody.  Workup by rheumatology did not show signs of active inflammatory disease on exam. V/Q scan was finally done in 7/24, this was consistent with chronic pulmonary emboli in the right lung. Echo (8/24) showed hyperdynamic LV, RV read as normal, PA pressures unable to be estimated. - Continue Eliquis 5 mg bid - He has been referred to [facility] for consideration of balloon pulmonary angioplasty (would not be good candidate for pulmonary thromboendarterectomy). He has not heard from [facility] yet => I will resend referral. - Continue CPAP for OSA.  - With suspected CTEPH, he has been on Adempas, only  able to get to 2 mg tid.  - Continue Opsumit 10 mg daily.  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 Cannot rule this out. 3. Tricuspid regurgitation: Moderate to severe on echo 05/23. Minimal on echo in 9/23. None on most recent echo (8/24).  4. History PE/DVT: PE/DVT 09/21 s/p TPA, required pressor support with NE. Recurrent PE in 5/23 off Eliquis. Now concern for chronic thromboembolic disease as above.  - Will need lifelong anticoagulation. Continue Eliquis. 5. HTN: BP controlled.  6. Spinal stenosis: Limited mobility at baseline. Currently at SNF working with PT. 7. OSA: Continue CPAP.  8. Obesity: There is no height or weight on file to calculate BMI. - He has stopped his Mounjaro due to side effects.  Followup in 4 months with APP.   I spent 31 minutes reviewing records, interviewing/examining patient, and managing orders.   Marca Ancona, MD 12/30/2023

## 2023-12-31 ENCOUNTER — Other Ambulatory Visit (HOSPITAL_COMMUNITY): Payer: Self-pay

## 2023-12-31 DIAGNOSIS — Z79899 Other long term (current) drug therapy: Secondary | ICD-10-CM | POA: Diagnosis not present

## 2023-12-31 DIAGNOSIS — M5489 Other dorsalgia: Secondary | ICD-10-CM | POA: Diagnosis not present

## 2023-12-31 DIAGNOSIS — Z9181 History of falling: Secondary | ICD-10-CM | POA: Diagnosis not present

## 2023-12-31 DIAGNOSIS — M25551 Pain in right hip: Secondary | ICD-10-CM | POA: Diagnosis not present

## 2023-12-31 DIAGNOSIS — G8929 Other chronic pain: Secondary | ICD-10-CM | POA: Diagnosis not present

## 2023-12-31 DIAGNOSIS — M129 Arthropathy, unspecified: Secondary | ICD-10-CM | POA: Diagnosis not present

## 2023-12-31 DIAGNOSIS — M25571 Pain in right ankle and joints of right foot: Secondary | ICD-10-CM | POA: Diagnosis not present

## 2023-12-31 DIAGNOSIS — R7303 Prediabetes: Secondary | ICD-10-CM | POA: Diagnosis not present

## 2023-12-31 DIAGNOSIS — E559 Vitamin D deficiency, unspecified: Secondary | ICD-10-CM | POA: Diagnosis not present

## 2023-12-31 DIAGNOSIS — I10 Essential (primary) hypertension: Secondary | ICD-10-CM | POA: Diagnosis not present

## 2023-12-31 MED ORDER — GABAPENTIN 800 MG PO TABS
800.0000 mg | ORAL_TABLET | Freq: Four times a day (QID) | ORAL | 0 refills | Status: DC
  Filled 2024-01-06: qty 120, 30d supply, fill #0

## 2023-12-31 MED ORDER — HYDROCODONE-ACETAMINOPHEN 10-325 MG PO TABS
1.0000 | ORAL_TABLET | Freq: Four times a day (QID) | ORAL | 0 refills | Status: DC | PRN
  Filled 2023-12-31 – 2024-01-06 (×2): qty 120, 30d supply, fill #0

## 2024-01-02 DIAGNOSIS — Z79899 Other long term (current) drug therapy: Secondary | ICD-10-CM | POA: Diagnosis not present

## 2024-01-06 ENCOUNTER — Other Ambulatory Visit (HOSPITAL_COMMUNITY): Payer: Self-pay

## 2024-01-06 ENCOUNTER — Other Ambulatory Visit: Payer: Self-pay

## 2024-01-06 DIAGNOSIS — L89153 Pressure ulcer of sacral region, stage 3: Secondary | ICD-10-CM | POA: Diagnosis not present

## 2024-01-07 ENCOUNTER — Other Ambulatory Visit (HOSPITAL_COMMUNITY): Payer: Self-pay

## 2024-01-07 DIAGNOSIS — G4733 Obstructive sleep apnea (adult) (pediatric): Secondary | ICD-10-CM | POA: Diagnosis not present

## 2024-01-22 ENCOUNTER — Other Ambulatory Visit: Payer: Self-pay | Admitting: Family Medicine

## 2024-01-22 DIAGNOSIS — R258 Other abnormal involuntary movements: Secondary | ICD-10-CM

## 2024-01-28 ENCOUNTER — Other Ambulatory Visit (HOSPITAL_COMMUNITY): Payer: Self-pay

## 2024-01-28 DIAGNOSIS — R7303 Prediabetes: Secondary | ICD-10-CM | POA: Diagnosis not present

## 2024-01-28 DIAGNOSIS — M25551 Pain in right hip: Secondary | ICD-10-CM | POA: Diagnosis not present

## 2024-01-28 DIAGNOSIS — Z79899 Other long term (current) drug therapy: Secondary | ICD-10-CM | POA: Diagnosis not present

## 2024-01-28 DIAGNOSIS — G8929 Other chronic pain: Secondary | ICD-10-CM | POA: Diagnosis not present

## 2024-01-28 DIAGNOSIS — Z9181 History of falling: Secondary | ICD-10-CM | POA: Diagnosis not present

## 2024-01-28 DIAGNOSIS — M5489 Other dorsalgia: Secondary | ICD-10-CM | POA: Diagnosis not present

## 2024-01-28 DIAGNOSIS — M25571 Pain in right ankle and joints of right foot: Secondary | ICD-10-CM | POA: Diagnosis not present

## 2024-01-28 DIAGNOSIS — I10 Essential (primary) hypertension: Secondary | ICD-10-CM | POA: Diagnosis not present

## 2024-01-28 MED ORDER — GABAPENTIN 800 MG PO TABS
800.0000 mg | ORAL_TABLET | Freq: Four times a day (QID) | ORAL | 0 refills | Status: DC
Start: 2024-01-28 — End: 2024-02-26
  Filled 2024-02-05: qty 120, 30d supply, fill #0

## 2024-01-28 MED ORDER — HYDROCODONE-ACETAMINOPHEN 10-325 MG PO TABS
1.0000 | ORAL_TABLET | Freq: Four times a day (QID) | ORAL | 0 refills | Status: DC | PRN
Start: 2024-01-28 — End: 2024-02-26
  Filled 2024-02-05: qty 120, 30d supply, fill #0

## 2024-01-29 DIAGNOSIS — Z79899 Other long term (current) drug therapy: Secondary | ICD-10-CM | POA: Diagnosis not present

## 2024-02-03 ENCOUNTER — Other Ambulatory Visit: Payer: Self-pay | Admitting: Family Medicine

## 2024-02-05 ENCOUNTER — Other Ambulatory Visit: Payer: Self-pay | Admitting: Family Medicine

## 2024-02-05 ENCOUNTER — Other Ambulatory Visit (HOSPITAL_COMMUNITY): Payer: Self-pay

## 2024-02-05 DIAGNOSIS — L89153 Pressure ulcer of sacral region, stage 3: Secondary | ICD-10-CM | POA: Diagnosis not present

## 2024-02-05 MED ORDER — DULOXETINE HCL 30 MG PO CPEP: 30.0000 mg | ORAL_CAPSULE | Freq: Every day | ORAL | 0 refills | Status: DC

## 2024-02-05 NOTE — Telephone Encounter (Signed)
 Copied from CRM [id number]. Topic: Clinical - Medication Refill >> Feb 05, 2024  3:10 PM Allyne Areola wrote: Most Recent Primary Care Visit:  Provider: [place], [name]  Department: [facility]  Visit Type: OFFICE VISIT  Date: 07/01/2023  Medication: DULoxetine  (CYMBALTA ) 30 MG capsule  Has the patient contacted their pharmacy? Yes, told patient that medication was denied.  (Agent: If no, request that the patient contact the pharmacy for the refill. If patient does not wish to contact the pharmacy document the reason why and proceed with request.) (Agent: If yes, when and what did the pharmacy advise?)  Is this the correct pharmacy for this prescription? Yes If no, delete pharmacy and type the correct one.  This is the patient's preferred pharmacy:  [facility] - [place], [place] - [address] [place] [address] Phone: [phone] Fax: [phone]    Has the prescription been filled recently? No  Is the patient out of the medication? No  Has the patient been seen for an appointment in the last year OR does the patient have an upcoming appointment? Yes  Can we respond through MyChart? Yes  Agent: Please be advised that Rx refills may take up to 3 business days. We ask that you follow-up with your pharmacy.

## 2024-02-05 NOTE — Telephone Encounter (Signed)
 Pt was on 60 mg that was being prescribed by you, but hospital changed it to 30 mg.

## 2024-02-18 DIAGNOSIS — Z87891 Personal history of nicotine dependence: Secondary | ICD-10-CM | POA: Diagnosis not present

## 2024-02-18 DIAGNOSIS — M4800 Spinal stenosis, site unspecified: Secondary | ICD-10-CM | POA: Diagnosis not present

## 2024-02-18 DIAGNOSIS — G8929 Other chronic pain: Secondary | ICD-10-CM | POA: Diagnosis not present

## 2024-02-18 DIAGNOSIS — I2724 Chronic thromboembolic pulmonary hypertension: Secondary | ICD-10-CM | POA: Diagnosis not present

## 2024-02-18 DIAGNOSIS — Z86711 Personal history of pulmonary embolism: Secondary | ICD-10-CM | POA: Diagnosis not present

## 2024-02-18 DIAGNOSIS — N182 Chronic kidney disease, stage 2 (mild): Secondary | ICD-10-CM | POA: Diagnosis not present

## 2024-02-18 DIAGNOSIS — I5032 Chronic diastolic (congestive) heart failure: Secondary | ICD-10-CM | POA: Diagnosis not present

## 2024-02-18 DIAGNOSIS — I498 Other specified cardiac arrhythmias: Secondary | ICD-10-CM | POA: Diagnosis not present

## 2024-02-18 DIAGNOSIS — I130 Hypertensive heart and chronic kidney disease with heart failure and stage 1 through stage 4 chronic kidney disease, or unspecified chronic kidney disease: Secondary | ICD-10-CM | POA: Diagnosis not present

## 2024-02-18 DIAGNOSIS — Z86718 Personal history of other venous thrombosis and embolism: Secondary | ICD-10-CM | POA: Diagnosis not present

## 2024-02-18 DIAGNOSIS — E1122 Type 2 diabetes mellitus with diabetic chronic kidney disease: Secondary | ICD-10-CM | POA: Diagnosis not present

## 2024-02-18 DIAGNOSIS — Z79899 Other long term (current) drug therapy: Secondary | ICD-10-CM | POA: Diagnosis not present

## 2024-02-18 DIAGNOSIS — R931 Abnormal findings on diagnostic imaging of heart and coronary circulation: Secondary | ICD-10-CM | POA: Diagnosis not present

## 2024-02-18 DIAGNOSIS — G4733 Obstructive sleep apnea (adult) (pediatric): Secondary | ICD-10-CM | POA: Diagnosis not present

## 2024-02-18 DIAGNOSIS — R942 Abnormal results of pulmonary function studies: Secondary | ICD-10-CM | POA: Diagnosis not present

## 2024-02-18 DIAGNOSIS — R0602 Shortness of breath: Secondary | ICD-10-CM | POA: Diagnosis not present

## 2024-02-18 DIAGNOSIS — Z7985 Long-term (current) use of injectable non-insulin antidiabetic drugs: Secondary | ICD-10-CM | POA: Diagnosis not present

## 2024-02-26 ENCOUNTER — Other Ambulatory Visit (HOSPITAL_COMMUNITY): Payer: Self-pay

## 2024-02-26 DIAGNOSIS — M25571 Pain in right ankle and joints of right foot: Secondary | ICD-10-CM | POA: Diagnosis not present

## 2024-02-26 DIAGNOSIS — G8929 Other chronic pain: Secondary | ICD-10-CM | POA: Diagnosis not present

## 2024-02-26 DIAGNOSIS — Z79899 Other long term (current) drug therapy: Secondary | ICD-10-CM | POA: Diagnosis not present

## 2024-02-26 DIAGNOSIS — M5489 Other dorsalgia: Secondary | ICD-10-CM | POA: Diagnosis not present

## 2024-02-26 DIAGNOSIS — I10 Essential (primary) hypertension: Secondary | ICD-10-CM | POA: Diagnosis not present

## 2024-02-26 DIAGNOSIS — R7303 Prediabetes: Secondary | ICD-10-CM | POA: Diagnosis not present

## 2024-02-26 DIAGNOSIS — Z9181 History of falling: Secondary | ICD-10-CM | POA: Diagnosis not present

## 2024-02-26 DIAGNOSIS — M25551 Pain in right hip: Secondary | ICD-10-CM | POA: Diagnosis not present

## 2024-02-26 MED ORDER — GABAPENTIN 800 MG PO TABS
800.0000 mg | ORAL_TABLET | Freq: Four times a day (QID) | ORAL | 0 refills | Status: DC
  Filled 2024-03-06: qty 120, 30d supply, fill #0
  Filled ????-??-??: fill #0

## 2024-02-26 MED ORDER — HYDROCODONE-ACETAMINOPHEN 10-325 MG PO TABS
1.0000 | ORAL_TABLET | Freq: Four times a day (QID) | ORAL | 0 refills | Status: DC | PRN
  Filled 2024-03-06: qty 120, 30d supply, fill #0
  Filled ????-??-??: fill #0

## 2024-02-28 DIAGNOSIS — Z79899 Other long term (current) drug therapy: Secondary | ICD-10-CM | POA: Diagnosis not present

## 2024-03-06 ENCOUNTER — Other Ambulatory Visit (HOSPITAL_COMMUNITY): Payer: Self-pay

## 2024-03-07 DIAGNOSIS — L89153 Pressure ulcer of sacral region, stage 3: Secondary | ICD-10-CM | POA: Diagnosis not present

## 2024-03-23 ENCOUNTER — Other Ambulatory Visit (HOSPITAL_COMMUNITY): Payer: Self-pay

## 2024-03-23 MED ORDER — LATANOPROST 0.005 % OP SOLN
1.0000 [drp] | Freq: Every evening | OPHTHALMIC | 0 refills | Status: AC
  Filled 2024-03-23: qty 2.5, 25d supply, fill #0

## 2024-03-25 ENCOUNTER — Other Ambulatory Visit (HOSPITAL_COMMUNITY): Payer: Self-pay

## 2024-03-25 MED ORDER — HYDROCODONE-ACETAMINOPHEN 10-325 MG PO TABS
1.0000 | ORAL_TABLET | Freq: Four times a day (QID) | ORAL | 0 refills | Status: DC | PRN
  Filled 2024-03-25 – 2024-04-03 (×2): qty 120, 30d supply, fill #0

## 2024-03-25 MED ORDER — GABAPENTIN 800 MG PO TABS
800.0000 mg | ORAL_TABLET | Freq: Four times a day (QID) | ORAL | 0 refills | Status: DC
  Filled 2024-03-25 – 2024-04-03 (×2): qty 120, 30d supply, fill #0

## 2024-03-31 ENCOUNTER — Other Ambulatory Visit: Payer: Self-pay | Admitting: Family Medicine

## 2024-04-02 ENCOUNTER — Other Ambulatory Visit (HOSPITAL_COMMUNITY): Payer: Self-pay

## 2024-04-03 ENCOUNTER — Other Ambulatory Visit (HOSPITAL_COMMUNITY): Payer: Self-pay | Admitting: Cardiology

## 2024-04-03 ENCOUNTER — Other Ambulatory Visit (HOSPITAL_COMMUNITY): Payer: Self-pay

## 2024-04-07 ENCOUNTER — Ambulatory Visit (INDEPENDENT_AMBULATORY_CARE_PROVIDER_SITE_OTHER): Admitting: Family Medicine

## 2024-04-07 ENCOUNTER — Other Ambulatory Visit (HOSPITAL_COMMUNITY): Payer: Self-pay | Admitting: Cardiology

## 2024-04-07 DIAGNOSIS — Z0000 Encounter for general adult medical examination without abnormal findings: Secondary | ICD-10-CM | POA: Diagnosis not present

## 2024-04-07 NOTE — Patient Instructions (Signed)
 I really enjoyed getting to talk with you today! I am available on Tuesdays and Thursdays for virtual visits if you have any questions or concerns, or if I can be of any further assistance.   CHECKLIST FROM ANNUAL WELLNESS VISIT:  -Follow up (please call to schedule if not scheduled after visit):   -yearly for annual wellness visit with primary care office  Here is a list of your preventive care/health maintenance measures and the plan for each if any are due:  PLAN For any measures below that may be due:   1.) Schedule in office visit with Dr. Swaziland for labs and foot exam  2.) Can get the shingles, tetanus and covid vaccines at the pharmacy. Please let us  know when you do so that we can update your record. Also, the flu shot will be due soon in the fall.  3.) Please bring a copy of your diabetic eye exam results to Dr. Swaziland.  Health Maintenance  Topic Date Due   Zoster Vaccines- Shingrix (1 of 2) Never done   Diabetic kidney evaluation - Urine ACR  11/27/2007   COVID-19 Vaccine (4 - 2024-25 season) 06/09/2023   DTaP/Tdap/Td (3 - Td or Tdap) 11/10/2023   OPHTHALMOLOGY EXAM  12/07/2023   HEMOGLOBIN A1C  12/29/2023   FOOT EXAM  03/19/2024   INFLUENZA VACCINE  05/08/2024   Medicare Annual Wellness (AWV)  12/02/2024   Diabetic kidney evaluation - eGFR measurement  12/29/2024   Colonoscopy  09/20/2028   Pneumococcal Vaccine: 50+ Years  Completed   Hepatitis C Screening  Completed   Hepatitis B Vaccines  Aged Out   HPV VACCINES  Aged Out   Meningococcal B Vaccine  Aged Out    -See a dentist at least yearly  -Get your eyes checked and then per your eye specialist's recommendations  -Other issues addressed today:   -I have included below further information regarding a healthy whole foods based diet, physical activity guidelines for adults, stress management and opportunities for social connections. I hope you find this information useful.    -----------------------------------------------------------------------------------------------------------------------------------------------------------------------------------------------------------------------------------------------------------    NUTRITION: -eat real food: lots of colorful vegetables (half the plate) and fruits -5-7 servings of vegetables and fruits per day (fresh or steamed is best), exp. 2 servings of vegetables with lunch and dinner and 2 servings of fruit per day. Berries and greens such as kale and collards are great choices.  -consume on a regular basis:  fresh fruits, fresh veggies, fish, nuts, seeds, healthy oils (such as olive oil, avocado oil), whole grains (make sure for bread/pasta/crackers/etc., that the first ingredient on label contains the word whole), legumes. -can eat small amounts of dairy and lean meat (no larger than the palm of your hand), but avoid processed meats such as ham, bacon, lunch meat, etc. -drink water  -try to avoid fast food and pre-packaged foods, processed meat, ultra processed foods/beverages (donuts, candy, etc.) -most experts advise limiting sodium to < 2300mg  per day, should limit further is any chronic conditions such as high blood pressure, heart disease, diabetes, etc. The American Heart Association advised that < 1500mg  is is ideal -try to avoid foods/beverages that contain any ingredients with names you do not recognize  -try to avoid foods/beverages  with added sugar or sweeteners/sweets  -try to avoid sweet drinks (including diet drinks): soda, juice, Gatorade, sweet tea, power drinks, diet drinks -try to avoid white rice, white bread, pasta (unless whole grain)  EXERCISE GUIDELINES FOR ADULTS: -if you wish to increase your  physical activity, do so gradually and with the approval of your doctor -STOP and seek medical care immediately if you have any chest pain, chest discomfort or trouble breathing when starting or  increasing exercise  -move and stretch your body, legs, feet and arms when sitting for long periods -Physical activity guidelines for optimal health in adults: -get at least 150 minutes per week of moderate exercise (can talk, but not sing); this is about 20-30 minutes of sustained activity 5-7 days per week or two 10-15 minute episodes of sustained activity 5-7 days per week -do some muscle building/resistance training/strength training at least 2 days per week  -balance exercises 3+ days per week:   Stand somewhere where you have something sturdy to hold onto if you lose balance    1) lift up on toes, then back down, start with 5x per day and work up to 20x   2) stand and lift one leg straight out to the side so that foot is a few inches of the floor, start with 5x each side and work up to 20x each side   3) stand on one foot, start with 5 seconds each side and work up to 20 seconds on each side  If you need ideas or help with getting more active:  -Silver sneakers https://tools.silversneakers.com  -Walk with a Doc: http://www.duncan-williams.com/  -try to include resistance (weight lifting/strength building) and balance exercises twice per week: or the following link for ideas: http://castillo-powell.com/  BuyDucts.dk  STRESS MANAGEMENT: -can try meditating, or just sitting quietly with deep breathing while intentionally relaxing all parts of your body for 5 minutes daily -if you need further help with stress, anxiety or depression please follow up with your primary doctor or contact the wonderful folks at [facility]: [phone]  SOCIAL CONNECTIONS: -options in [place] if you wish to engage in more social and exercise related activities:  -Silver sneakers https://tools.silversneakers.com  -Walk with a Doc: http://www.duncan-williams.com/  -Check out the [facility] Active Adults 50+  section on the [place] [employer] (hiking clubs, book clubs, cards and games, chess, exercise classes, aquatic classes and much more) - see the website for details: https://www.[place].gov/departments/parks-recreation/active-adults50  -YouTube has lots of exercise videos for different ages and abilities as well  -[facility] (a variety of indoor and outdoor inperson activities for adults). ([phone])[phone]. [address].  -Virtual Online Classes (a variety of topics): see seniorplanet.org or call [phone]  -consider volunteering at a school, hospice center, church, senior center or elsewhere

## 2024-04-07 NOTE — Progress Notes (Signed)
 PATIENT CHECK-IN and HEALTH RISK ASSESSMENT QUESTIONNAIRE:  -completed by phone/video for upcoming Medicare Preventive Visit   Pre-Visit Check-in: 1)Vitals (height, wt, BP, etc) - record in vitals section for visit on day of visit Request home vitals (wt, BP, etc.) and enter into vitals, THEN update Vital Signs SmartPhrase below at the top of the HPI. See below.  2)Review and Update Medications, Allergies PMH, Surgeries, Social history in Epic 3)Hospitalizations in the last year with date/reason? For anemia back in December  4)Review and Update Care Team (patient's specialists) in Epic 5) Complete PHQ9 in Epic  6) Complete Fall Screening in Epic 7)Review all Health Maintenance Due and order under PCP if not done.  Medicare Wellness Patient Questionnaire:  Answer theses question about your habits: How often do you have a drink containing alcohol?2-3 times per year How many drinks containing alcohol do you have on a typical day when you are drinking?1-2 How often do you have six or more drinks on one occasion?n Have you ever smoked? Quit smoking in 1994 How many packs a day do/did you smoke? Less than a ppd Do you use smokeless tobacco?n Do you use an illicit drugs?n On average, how many days per week do you engage in moderate to strenuous exercise (like a brisk walk)? He is doing some leg exercises with bands, standing exercises On average, how many minutes do you engage in exercise at this level? 30 minutes 5 days a week Typical breakfast: eggs, cereal sometimes, some fruit Typical lunch:sandwich Typical dinner: admits eats too much fried food, he is trying to improve Typical snacks:watermelon  Beverages: big water  drinker, doesn't like sweetened beverages  Answer theses question about your everyday activities: Can you perform most household chores?y Are you deaf or have significant trouble hearing?n Do you feel that you have a problem with memory?n Do you feel safe at  home?y Last dentist visit? Usually goes on a regular basis, old dentist retired 8. Do you have any difficulty performing your everyday activities?n Are you having any difficulty walking, taking medications on your own, and or difficulty managing daily home needs?n Do you have difficulty walking or climbing stairs?n Do you have difficulty dressing or bathing?n Do you have difficulty doing errands alone such as visiting a doctor's office or shopping? Wife does the driving Do you currently have any difficulty preparing food and eating?n Do you currently have any difficulty using the toilet?n Do you have any difficulty managing your finances?n Do you have any difficulties with housekeeping of managing your housekeeping?n   Do you have Advanced Directives in place (Living Will, Healthcare Power or Attorney)? y   Last eye Exam and location?Has appt next week, [facility] and seeing Dr. Regina   Do you currently use prescribed or non-prescribed narcotic or opioid pain medications?yes, goes to pain clinic for the pain medications.  Do you have a history or close family history of breast, ovarian, tubal or peritoneal cancer or a family member with BRCA (breast cancer susceptibility 1 and 2) gene mutations? N except mom had breast cancer    ----------------------------------------------------------------------------------------------------------------------------------------------------------------------------------------------------------------------  Because this visit was a virtual/telehealth visit, some criteria may be missing or patient reported. Any vitals not documented were not able to be obtained and vitals that have been documented are patient reported.    MEDICARE ANNUAL PREVENTIVE CARE VISIT WITH PROVIDER (Welcome to Medicare, initial annual wellness or annual wellness exam)  Virtual Visit via Video  Note  I connected with Keith [name] on  04/07/24  by a video enabled  telemedicine application and verified that I am speaking with the correct person using two identifiers.  Location patient: home Location provider:work or home office Persons participating in the virtual visit: patient, provider  Concerns and/or follow up today: had extensive blood work with [facility] in May. Reports doing well.   See HM section in Epic for other details of completed HM.    ROS: negative for report of fevers, unintentional weight loss, vision changes, vision loss, hearing loss or change, chest pain, sob, hemoptysis, melena, hematochezia, hematuria, falls, bleeding or bruising, thoughts of suicide or self harm, memory loss  Patient-completed extensive health risk assessment - reviewed and discussed with the patient: See Health Risk Assessment completed with patient prior to the visit either above or in recent phone note. This was reviewed in detailed with the patient today and appropriate recommendations, orders and referrals were placed as needed per Summary below and patient instructions.   Review of Medical History: -PMH, PSH, Family History and current specialty and care providers reviewed and updated and listed below   Patient Care Team: [place], [name], MD as PCP - General (Family Medicine) Lionell Jon DEL, [facility] (Pharmacist)   Past Medical History:  Diagnosis Date   Allergy    CHF (congestive heart failure) (HCC)    Chronic back pain    Diabetes mellitus without complication (HCC)    DNR (do not resuscitate) 11/19/2020   Duodenal ulcer hemorrhage 08/29/2014   ED (erectile dysfunction)    Esophageal stricture 08/30/2014   Glucose intolerance (impaired glucose tolerance)    Hiatal hernia 08/30/2014   Hypertension    Hypokalemia 04/11/2013   MRSA carrier 08/30/2014   Obesity    Osteoarthritis    Pneumonia    Pulmonary embolism (HCC) 06/2020   Scoliosis 2016   Spinal stenosis of lumbar region    Urinary tract infection 04/11/2013    Past Surgical History:   Procedure Laterality Date   BACK SURGERY  08/07/2021   x 2   BIOPSY  09/20/2023   Procedure: BIOPSY;  Surgeon: Legrand Victory LITTIE DOUGLAS, MD;  Location: [facility] ENDOSCOPY;  Service: Gastroenterology;;   BIOPSY  09/21/2023   Procedure: BIOPSY;  Surgeon: San Sandor GAILS, DO;  Location: [facility] ENDOSCOPY;  Service: Gastroenterology;;  rectal   COLONOSCOPY  2008   COLONOSCOPY WITH PROPOFOL  N/A 09/21/2023   Procedure: COLONOSCOPY WITH PROPOFOL ;  Surgeon: San Sandor GAILS, DO;  Location: [facility] ENDOSCOPY;  Service: Gastroenterology;  Laterality: N/A;   ESOPHAGOGASTRODUODENOSCOPY N/A 08/29/2014   Procedure: ESOPHAGOGASTRODUODENOSCOPY (EGD);  Surgeon: Princella CHRISTELLA Nida, MD;  Location: [facility];  Service: Endoscopy;  Laterality: N/A;   ESOPHAGOGASTRODUODENOSCOPY (EGD) WITH PROPOFOL  N/A 09/20/2023   Procedure: ESOPHAGOGASTRODUODENOSCOPY (EGD) WITH PROPOFOL ;  Surgeon: Legrand Victory LITTIE DOUGLAS, MD;  Location: [facility] ENDOSCOPY;  Service: Gastroenterology;  Laterality: N/A;   LAMINECTOMY N/A 07/28/2018   Procedure: THORACIC ELEVEN- THORACIC TWELVE POSTERIOR DECOMPRESSION LAMINECTOMY;  Surgeon: Cheryle Debby LABOR, MD;  Location: [facility] OR;  Service: Neurosurgery;  Laterality: N/A;   LUMBAR LAMINECTOMY     NASAL POLYP SURGERY     ORIF ANKLE FRACTURE Right 07/01/2020   Procedure: OPEN REDUCTION INTERNAL FIXATION (ORIF) ANKLE FRACTURE. TRIMALLEOLAR;  Surgeon: Josefina Chew, MD;  Location: [facility] OR;  Service: Orthopedics;  Laterality: Right;   POLYPECTOMY  09/21/2023   Procedure: POLYPECTOMY;  Surgeon: San Sandor GAILS, DO;  Location: [facility] ENDOSCOPY;  Service: Gastroenterology;;  cecal   RIGHT HEART CATH N/A 07/06/2022   Procedure: RIGHT HEART CATH;  Surgeon: Rolan Ezra RAMAN, MD;  Location: [facility] INVASIVE CV LAB;  Service: Cardiovascular;  Laterality: N/A;   RIGHT/LEFT HEART CATH AND CORONARY ANGIOGRAPHY N/A 03/02/2022   Procedure: RIGHT/LEFT HEART CATH AND CORONARY ANGIOGRAPHY;  Surgeon: Claudene Pacific, MD;  Location: [facility] INVASIVE CV LAB;   Service: Cardiovascular;  Laterality: N/A;   TONSILLECTOMY      Social History   Socioeconomic History   Marital status: Married    Spouse name: Wanda   Number of children: 3   Years of education: 4 years college   Highest education level: Bachelor's degree (e.g., BA, AB, BS)  Occupational History   Occupation: retired  Tobacco Use   Smoking status: Former    Current packs/day: 0.00    Average packs/day: 1 pack/day for 25.0 years (25.0 ttl pk-yrs)    Types: Cigarettes    Start date: 7    Quit date: 1995    Years since quitting: 30.5    Passive exposure: Never   Smokeless tobacco: Never  Vaping Use   Vaping status: Never Used  Substance and Sexual Activity   Alcohol use: Yes    Comment: holidays and special occasions   Drug use: Not Currently   Sexual activity: Yes    Birth control/protection: None  Other Topics Concern   Not on file  Social History Narrative   Married, 5 kids, 14 grand, 2 great grand, watch TV, Armed forces operational officer, traveling   Social Drivers of Corporate investment banker Strain: Low Risk  (04/06/2024)   Overall Financial Resource Strain (CARDIA)    Difficulty of Paying Living Expenses: Not hard at all  Food Insecurity: No Food Insecurity (04/06/2024)   Hunger Vital Sign    Worried About Running Out of Food in the Last Year: Never true    Ran Out of Food in the Last Year: Never true  Transportation Needs: No Transportation Needs (04/06/2024)   PRAPARE - Administrator, Civil Service (Medical): No    Lack of Transportation (Non-Medical): No  Physical Activity: Inactive (04/06/2024)   Exercise Vital Sign    Days of Exercise per Week: 0 days    Minutes of Exercise per Session: Not on file  Stress: No Stress Concern Present (04/06/2024)   [employer] of Occupational Health - Occupational Stress Questionnaire    Feeling of Stress: Not at all  Social Connections: Moderately Integrated (04/06/2024)   Social Connection and  Isolation Panel    Frequency of Communication with Friends and Family: More than three times a week    Frequency of Social Gatherings with Friends and Family: More than three times a week    Attends Religious Services: More than 4 times per year    Active Member of [employer] or Organizations: No    Attends Banker Meetings: Not on file    Marital Status: Married  Catering manager Violence: Not At Risk (09/18/2023)   Humiliation, Afraid, Rape, and Kick questionnaire    Fear of Current or Ex-Partner: No    Emotionally Abused: No    Physically Abused: No    Sexually Abused: No    Family History  Problem Relation Age of Onset   Diabetes Mother    Asthma Mother    Breast cancer Mother    Colon polyps Mother    Hypertension Father    Stroke Father    Heart failure Brother    Stroke Brother    Ovarian cancer Maternal Grandmother    Hypertension Maternal  Grandfather    Heart attack Maternal Grandfather    Colon cancer Neg Hx     Current Outpatient Medications on File Prior to Visit  Medication Sig Dispense Refill   amLODipine  (NORVASC ) 10 MG tablet TAKE 1 TABLET BY MOUTH DAILY 100 tablet 2   apixaban  (ELIQUIS ) 5 MG TABS tablet TAKE 1 TABLET BY MOUTH TWICE A DAY 180 tablet 0   carvedilol  (COREG ) 12.5 MG tablet TAKE 1 TABLET BY MOUTH TWICE  DAILY WITH MEALS 200 tablet 2   cholecalciferol  (VITAMIN D3) 25 MCG (1000 UNIT) tablet Take 1,000 Units by mouth daily.     DULoxetine  (CYMBALTA ) 30 MG capsule Take 1 capsule (30 mg total) by mouth daily. Due for follow up 90 capsule 0   ferrous sulfate  325 (65 FE) MG tablet TAKE 1 TABLET BY MOUTH EVERY DAY WITH BREAKFAST 90 tablet 1   gabapentin  (NEURONTIN ) 800 MG tablet Take 1 tablet (800 mg total) by mouth 4 (four) times daily for neuropathy 120 tablet 0   HYDROcodone -acetaminophen  (NORCO) 10-325 MG tablet Take 1 tablet by mouth 4 (four) times daily as needed for pain. 120 tablet 0   HYDROcodone -acetaminophen  (NORCO) 7.5-325 MG tablet  Take 1 tablet by mouth 4 (four) times daily as needed for pain. 120 tablet 0   latanoprost (XALATAN) 0.005 % ophthalmic solution Place 1 drop into both eyes every evening. 2.5 mL 0   levETIRAcetam  (KEPPRA ) 250 MG tablet TAKE 1 TABLET BY MOUTH TWICE  DAILY 200 tablet 2   lidocaine  (LIDODERM ) 5 % Place 1 patch onto the skin daily as needed (pain). Remove & Discard patch within 12 hours or as directed by MD     macitentan  (OPSUMIT ) 10 MG tablet Take 10 mg by mouth daily.     naloxone  (NARCAN ) nasal spray 4 mg/0.1 mL Place 1 spray into the nose once as needed 2 each 3   omeprazole  (PRILOSEC) 40 MG capsule TAKE 1 CAPSULE BY MOUTH DAILY 100 capsule 2   polyethylene glycol (MIRALAX  / GLYCOLAX ) 17 g packet Take 17 g by mouth daily as needed for mild constipation.     Riociguat  (ADEMPAS ) 2 MG TABS Take 2 mg by mouth in the morning, at noon, and at bedtime. 90 tablet 11   rOPINIRole  (REQUIP ) 1 MG tablet TAKE 1 TABLET BY MOUTH AT  BEDTIME 100 tablet 1   rosuvastatin  (CRESTOR ) 20 MG tablet TAKE 1 TABLET BY MOUTH DAILY 100 tablet 2   senna-docusate (SENOKOT-S) 8.6-50 MG tablet Take 2 tablets by mouth at bedtime. 60 tablet 0   spironolactone  (ALDACTONE ) 25 MG tablet TAKE 1 TABLET BY MOUTH DAILY 100 tablet 2   torsemide  40 MG TABS Take 60 mg by mouth daily. 30 tablet 0   traZODone  (DESYREL ) 50 MG tablet TAKE 1/2 TO 1 TABLET BY MOUTH AT BEDTIME AS NEEDED FOR SLEEP 90 tablet 1   vitamin C  (ASCORBIC ACID ) 250 MG tablet Take 250 mg by mouth daily.     No current facility-administered medications on file prior to visit.    Allergies  Allergen Reactions   Aspirin  Other (See Comments)    Irritates the stomach and the patient developed ulcers, also   Lisinopril  Other (See Comments)    Caused a body ache       Physical Exam Vitals requested from patient and listed below if patient had equipment and was able to obtain at home for this virtual visit: There were no vitals filed for this visit. Estimated body  mass index is  30.92 kg/m as calculated from the following:   Height as of 10/24/23: 5' 10 (1.778 m).   Weight as of 10/22/23: 215 lb 8 oz (97.8 kg).  EKG (optional): deferred due to virtual visit  GENERAL: alert, oriented, no acute distress detected; full vision exam deferred due to pandemic and/or virtual encounter  HEENT: atraumatic, conjunttiva clear, no obvious abnormalities on inspection of external nose and ears  NECK: normal movements of the head and neck  LUNGS: on inspection no signs of respiratory distress, breathing rate appears normal, no obvious gross SOB, gasping or wheezing  CV: no obvious cyanosis  MS: moves all visible extremities without noticeable abnormality  PSYCH/NEURO: pleasant and cooperative, no obvious depression or anxiety, speech and thought processing grossly intact, Cognitive function grossly intact  Flowsheet Row PULMONARY REHAB OTHER RESPIRATORY from 04/04/2023 in [facility]  PHQ-9 Total Score 0        04/07/2024   10:51 AM 04/04/2023    3:08 PM 03/20/2023    1:56 PM 01/14/2023   12:45 PM 11/02/2022    1:54 PM  Depression screen PHQ 2/9  Decreased Interest 0 0 0 0 0  Down, Depressed, Hopeless 0 0 0 0 0  PHQ - 2 Score 0 0 0 0 0  Altered sleeping  0  1   Tired, decreased energy  0  1   Change in appetite  0  0   Feeling bad or failure about yourself   0  0   Trouble concentrating  0  0   Moving slowly or fidgety/restless  0  0   Suicidal thoughts  0  0   PHQ-9 Score  0  2   Difficult doing work/chores  Not difficult at all  Not difficult at all        03/20/2023    1:55 PM 04/02/2023   10:00 AM 04/04/2023   10:34 AM 04/09/2023   11:30 AM 04/07/2024   10:50 AM  Fall Risk  Falls in the past year? 0 0 0 0 0  Was there an injury with Fall? 0 0 0 0 0  Fall Risk Category Calculator 0 0 0 0 0  Patient at Risk for Falls Due to Other (Comment) History of fall(s);Impaired balance/gait;Impaired  mobility;Orthopedic patient;Other (Comment) History of fall(s);Impaired balance/gait;Orthopedic patient History of fall(s);Impaired balance/gait;Orthopedic patient   Patient at Risk for Falls Due to - Comments  HIGH FALLS RISK     Fall risk Follow up Falls evaluation completed Falls evaluation completed Falls evaluation completed Falls evaluation completed Falls evaluation completed     SUMMARY AND PLAN:  Encounter for Medicare annual wellness exam   Discussed applicable health maintenance/preventive health measures and advised and referred or ordered per patient preferences: -discussed recs/risks for vaccines due, advised can get at the pharmacy and to provide proof of receipt when does so that we can update the record -he plans to come to the office fo a visit with Dr. Swaziland for labs and foot exam -reports eye exam is already scheduled for next week Health Maintenance  Topic Date Due   Zoster Vaccines- Shingrix (1 of 2) Never done   Diabetic kidney evaluation - Urine ACR  11/27/2007   COVID-19 Vaccine (4 - 2024-25 season) 06/09/2023   DTaP/Tdap/Td (3 - Td or Tdap) 11/10/2023   OPHTHALMOLOGY EXAM  12/07/2023   HEMOGLOBIN A1C  12/29/2023   FOOT EXAM  03/19/2024   INFLUENZA VACCINE  05/08/2024  Diabetic kidney evaluation - eGFR measurement  12/29/2024   Medicare Annual Wellness (AWV)  04/07/2025   Colonoscopy  09/20/2028   Pneumococcal Vaccine: 50+ Years  Completed   Hepatitis C Screening  Completed   Hepatitis B Vaccines  Aged Out   HPV VACCINES  Aged Out   Meningococcal B Vaccine  Aged [employer] and counseling on the following was provided based on the above review of health and a plan/checklist for the patient, along with additional information discussed, was provided for the patient in the patient instructions :     -Advised and counseled on a healthy lifestyle  -Reviewed patient's current diet. Advised and counseled on a whole foods based healthy diet. A summary  of a healthy diet was provided in the Patient Instructions.  -reviewed patient's current physical activity level and discussed exercise guidelines for adults. Discussed community resources and ideas for safe exercise at home to assist in meeting exercise guideline recommendations in a safe and healthy way.  -Advise yearly dental visits at minimum and regular eye exams   Follow up: see patient instructions   Patient Instructions  I really enjoyed getting to talk with you today! I am available on Tuesdays and Thursdays for virtual visits if you have any questions or concerns, or if I can be of any further assistance.   CHECKLIST FROM ANNUAL WELLNESS VISIT:  -Follow up (please call to schedule if not scheduled after visit):   -yearly for annual wellness visit with primary care office  Here is a list of your preventive care/health maintenance measures and the plan for each if any are due:  PLAN For any measures below that may be due:   1.) Schedule in office visit with Dr. [place]iland for labs and foot exam  2.) Can get the shingles, tetanus and covid vaccines at the pharmacy. Please let us  know when you do so that we can update your record. Also, the flu shot will be due soon in the fall.  3.) Please bring a copy of your diabetic eye exam results to Dr. [place].  Health Maintenance  Topic Date Due   Zoster Vaccines- Shingrix (1 of 2) Never done   Diabetic kidney evaluation - Urine ACR  11/27/2007   COVID-19 Vaccine (4 - 2024-25 season) 06/09/2023   DTaP/Tdap/Td (3 - Td or Tdap) 11/10/2023   OPHTHALMOLOGY EXAM  12/07/2023   HEMOGLOBIN A1C  12/29/2023   FOOT EXAM  03/19/2024   INFLUENZA VACCINE  05/08/2024   Medicare Annual Wellness (AWV)  12/02/2024   Diabetic kidney evaluation - eGFR measurement  12/29/2024   Colonoscopy  09/20/2028   Pneumococcal Vaccine: 50+ Years  Completed   Hepatitis C Screening  Completed   Hepatitis B Vaccines  Aged Out   HPV VACCINES  Aged Out    Meningococcal B Vaccine  Aged Out    -See a dentist at least yearly  -Get your eyes checked and then per your eye specialist's recommendations  -Other issues addressed today:   -I have included below further information regarding a healthy whole foods based diet, physical activity guidelines for adults, stress management and opportunities for social connections. I hope you find this information useful.   -----------------------------------------------------------------------------------------------------------------------------------------------------------------------------------------------------------------------------------------------------------    NUTRITION: -eat real food: lots of colorful vegetables (half the plate) and fruits -5-7 servings of vegetables and fruits per day (fresh or steamed is best), exp. 2 servings of vegetables with lunch and dinner and 2 servings of fruit per day. Berries  and greens such as kale and collards are great choices.  -consume on a regular basis:  fresh fruits, fresh veggies, fish, nuts, seeds, healthy oils (such as olive oil, avocado oil), whole grains (make sure for bread/pasta/crackers/etc., that the first ingredient on label contains the word whole), legumes. -can eat small amounts of dairy and lean meat (no larger than the palm of your hand), but avoid processed meats such as ham, bacon, lunch meat, etc. -drink water  -try to avoid fast food and pre-packaged foods, processed meat, ultra processed foods/beverages (donuts, candy, etc.) -most experts advise limiting sodium to < 2300mg  per day, should limit further is any chronic conditions such as high blood pressure, heart disease, diabetes, etc. The American Heart Association advised that < 1500mg  is is ideal -try to avoid foods/beverages that contain any ingredients with names you do not recognize  -try to avoid foods/beverages  with added sugar or sweeteners/sweets  -try to avoid sweet drinks  (including diet drinks): soda, juice, Gatorade, sweet tea, power drinks, diet drinks -try to avoid white rice, white bread, pasta (unless whole grain)  EXERCISE GUIDELINES FOR ADULTS: -if you wish to increase your physical activity, do so gradually and with the approval of your doctor -STOP and seek medical care immediately if you have any chest pain, chest discomfort or trouble breathing when starting or increasing exercise  -move and stretch your body, legs, feet and arms when sitting for long periods -Physical activity guidelines for optimal health in adults: -get at least 150 minutes per week of moderate exercise (can talk, but not sing); this is about 20-30 minutes of sustained activity 5-7 days per week or two 10-15 minute episodes of sustained activity 5-7 days per week -do some muscle building/resistance training/strength training at least 2 days per week  -balance exercises 3+ days per week:   Stand somewhere where you have something sturdy to hold onto if you lose balance    1) lift up on toes, then back down, start with 5x per day and work up to 20x   2) stand and lift one leg straight out to the side so that foot is a few inches of the floor, start with 5x each side and work up to 20x each side   3) stand on one foot, start with 5 seconds each side and work up to 20 seconds on each side  If you need ideas or help with getting more active:  -Silver sneakers https://tools.silversneakers.com  -Walk with a Doc: http://www.duncan-williams.com/  -try to include resistance (weight lifting/strength building) and balance exercises twice per week: or the following link for ideas: http://castillo-powell.com/  BuyDucts.dk  STRESS MANAGEMENT: -can try meditating, or just sitting quietly with deep breathing while intentionally relaxing all parts of your body for 5 minutes daily -if you need further help  with stress, anxiety or depression please follow up with your primary doctor or contact the wonderful folks at [facility]: [phone]  SOCIAL CONNECTIONS: -options in [place] if you wish to engage in more social and exercise related activities:  -Silver sneakers https://tools.silversneakers.com  -Walk with a Doc: http://www.duncan-williams.com/  -Check out the [facility] Active Adults 50+ section on the [place] of [employer] (hiking clubs, book clubs, cards and games, chess, exercise classes, aquatic classes and much more) - see the website for details: https://www.[place].gov/departments/parks-recreation/active-adults50  -YouTube has lots of exercise videos for different ages and abilities as well  -[facility] (a variety of indoor and outdoor inperson activities for adults). 8203807394. 7637441624  [address]  -Virtual Online Classes (a variety of topics): see [email or web address] or call [phone]  -consider volunteering at a school, hospice center, church, senior center or elsewhere            Chiquita JONELLE Cramp, DO

## 2024-04-22 ENCOUNTER — Other Ambulatory Visit (HOSPITAL_COMMUNITY): Payer: Self-pay

## 2024-04-22 ENCOUNTER — Telehealth (HOSPITAL_COMMUNITY): Payer: Self-pay | Admitting: *Deleted

## 2024-04-22 MED ORDER — HYDROCODONE-ACETAMINOPHEN 10-325 MG PO TABS
1.0000 | ORAL_TABLET | Freq: Four times a day (QID) | ORAL | 0 refills | Status: DC | PRN
  Filled 2024-05-04: qty 120, 30d supply, fill #0

## 2024-04-22 MED ORDER — GABAPENTIN 800 MG PO TABS
800.0000 mg | ORAL_TABLET | Freq: Four times a day (QID) | ORAL | 0 refills | Status: DC
  Filled 2024-04-22 – 2024-05-04 (×2): qty 120, 30d supply, fill #0
  Filled 2024-05-04: qty 100, 25d supply, fill #0

## 2024-04-22 MED ORDER — PREDNISOLONE ACETATE 1 % OP SUSP
OPHTHALMIC | 1 refills | Status: AC
  Filled 2024-04-22: qty 10, 28d supply, fill #0
  Filled 2024-06-03: qty 10, fill #1
  Filled ????-??-??: fill #1

## 2024-04-22 NOTE — Telephone Encounter (Signed)
  ADVANCED HEART FAILURE CLINIC   Pre-operative Risk Assessment   HEARTCARE STAFF-IMPORTANT INSTRUCTIONS 1 Red and Blue Text will auto delete once note is signed or closed. 2 Press F2 to navigate through template.   3 On drop down lists, L click to select >> R click to activate next field 4 Reason for Visit format is IMPORTANT!!  See Directions on No. 2 below. 5 Please review chart to determine if there is already a clearance note open for this procedure!!  DO NOT duplicate if a note already exists!!    :1}      Request for Surgical Clearance    Procedure:  Glaucoma Surgery (istent/Hydrus) left eye and then right eye 2-4 weeks later  Date of Surgery:  Clearance 04/27/24  left eye, right eye will be scheduled 2-4 weeks later                                Surgeon:  Dr Mabel Hum Surgeon's Group or Practice Name:  [facility] Phone number:  [phone] Fax number:  [phone]   Type of Clearance Requested:   - Medical  - Pharmacy:  Hold Apixaban  (Eliquis ) 2-3 days prior to each surgery   Type of Anesthesia:  IV Sedation   Additional requests/questions:  Please fax a copy of clearance/recommendations to the surgeon's office.  Patient with complex hx of DVT/PE and CTEPH will send to Dr Rolan for cardiac clearance and Eliquis  recommendations  Signed, Keith [name]   04/22/2024, 10:30 AM

## 2024-04-22 NOTE — Telephone Encounter (Signed)
 Ok for short hold of apixaban  for procedure, would keep hold to 2 days prior if ok with ophthalmologist.

## 2024-04-22 NOTE — Telephone Encounter (Signed)
 Info faxed to [facility].

## 2024-04-27 ENCOUNTER — Other Ambulatory Visit (HOSPITAL_COMMUNITY): Payer: Self-pay

## 2024-04-30 ENCOUNTER — Encounter (HOSPITAL_COMMUNITY)

## 2024-04-30 ENCOUNTER — Ambulatory Visit (HOSPITAL_COMMUNITY)

## 2024-05-04 ENCOUNTER — Other Ambulatory Visit (HOSPITAL_COMMUNITY): Payer: Self-pay

## 2024-05-13 ENCOUNTER — Telehealth (HOSPITAL_COMMUNITY): Payer: Self-pay

## 2024-05-13 NOTE — Telephone Encounter (Signed)
 Called to confirm/remind patient of their appointment at the [facility] on 05/14/24.   Appointment:   [x] Confirmed  [] Left mess   [] No answer/No voice mail  [] VM Full/unable to leave message  [] Phone not in service  Patient reminded to bring all medications and/or complete list.  Confirmed patient has transportation. Gave directions, instructed to utilize valet parking.

## 2024-05-14 ENCOUNTER — Ambulatory Visit (HOSPITAL_COMMUNITY): Payer: Self-pay | Admitting: Cardiology

## 2024-05-14 ENCOUNTER — Ambulatory Visit (HOSPITAL_COMMUNITY)
Admission: RE | Admit: 2024-05-14 | Discharge: 2024-05-14 | Disposition: A | Discharge status: Home / Self Care | Source: Ambulatory Visit | Attending: Cardiology | Admitting: Cardiology

## 2024-05-14 ENCOUNTER — Ambulatory Visit (HOSPITAL_BASED_OUTPATIENT_CLINIC_OR_DEPARTMENT_OTHER)
Admission: RE | Admit: 2024-05-14 | Discharge: 2024-05-14 | Disposition: A | Discharge status: Home / Self Care | Source: Ambulatory Visit | Attending: Cardiology | Admitting: Cardiology

## 2024-05-14 ENCOUNTER — Encounter (HOSPITAL_COMMUNITY): Payer: Self-pay

## 2024-05-14 VITALS — BP 110/60 | HR 78

## 2024-05-14 DIAGNOSIS — Z79899 Other long term (current) drug therapy: Secondary | ICD-10-CM | POA: Insufficient documentation

## 2024-05-14 DIAGNOSIS — I5032 Chronic diastolic (congestive) heart failure: Secondary | ICD-10-CM | POA: Insufficient documentation

## 2024-05-14 DIAGNOSIS — I130 Hypertensive heart and chronic kidney disease with heart failure and stage 1 through stage 4 chronic kidney disease, or unspecified chronic kidney disease: Secondary | ICD-10-CM | POA: Diagnosis not present

## 2024-05-14 DIAGNOSIS — E119 Type 2 diabetes mellitus without complications: Secondary | ICD-10-CM | POA: Insufficient documentation

## 2024-05-14 DIAGNOSIS — I2724 Chronic thromboembolic pulmonary hypertension: Secondary | ICD-10-CM | POA: Insufficient documentation

## 2024-05-14 DIAGNOSIS — Z87891 Personal history of nicotine dependence: Secondary | ICD-10-CM | POA: Diagnosis not present

## 2024-05-14 DIAGNOSIS — N1831 Chronic kidney disease, stage 3a: Secondary | ICD-10-CM

## 2024-05-14 DIAGNOSIS — I50810 Right heart failure, unspecified: Secondary | ICD-10-CM | POA: Diagnosis not present

## 2024-05-14 DIAGNOSIS — I071 Rheumatic tricuspid insufficiency: Secondary | ICD-10-CM

## 2024-05-14 DIAGNOSIS — I272 Pulmonary hypertension, unspecified: Secondary | ICD-10-CM | POA: Insufficient documentation

## 2024-05-14 DIAGNOSIS — I110 Hypertensive heart disease with heart failure: Secondary | ICD-10-CM | POA: Diagnosis present

## 2024-05-14 DIAGNOSIS — Z86711 Personal history of pulmonary embolism: Secondary | ICD-10-CM | POA: Diagnosis not present

## 2024-05-14 DIAGNOSIS — E669 Obesity, unspecified: Secondary | ICD-10-CM

## 2024-05-14 DIAGNOSIS — E785 Hyperlipidemia, unspecified: Secondary | ICD-10-CM | POA: Insufficient documentation

## 2024-05-14 DIAGNOSIS — G4733 Obstructive sleep apnea (adult) (pediatric): Secondary | ICD-10-CM | POA: Insufficient documentation

## 2024-05-14 DIAGNOSIS — I509 Heart failure, unspecified: Secondary | ICD-10-CM | POA: Diagnosis present

## 2024-05-14 DIAGNOSIS — M4800 Spinal stenosis, site unspecified: Secondary | ICD-10-CM | POA: Diagnosis not present

## 2024-05-14 DIAGNOSIS — I2721 Secondary pulmonary arterial hypertension: Secondary | ICD-10-CM | POA: Diagnosis not present

## 2024-05-14 DIAGNOSIS — E1122 Type 2 diabetes mellitus with diabetic chronic kidney disease: Secondary | ICD-10-CM | POA: Diagnosis not present

## 2024-05-14 LAB — ECHOCARDIOGRAM COMPLETE
Area-P 1/2: 3.77 cm2
S' Lateral: 2.7 cm

## 2024-05-14 LAB — BASIC METABOLIC PANEL WITH GFR
Anion gap: 7 (ref 5–15)
BUN: 18 mg/dL (ref 8–23)
CO2: 29 mmol/L (ref 22–32)
Calcium: 10.4 mg/dL — ABNORMAL HIGH (ref 8.9–10.3)
Chloride: 106 mmol/L (ref 98–111)
Creatinine, Ser: 1.02 mg/dL (ref 0.61–1.24)
GFR, Estimated: >60 mL/min (ref >60)
Glucose, Bld: 136 mg/dL — ABNORMAL HIGH (ref 70–99)
Potassium: 4.1 mmol/L (ref 3.5–5.1)
Sodium: 142 mmol/L (ref 135–145)

## 2024-05-14 LAB — BRAIN NATRIURETIC PEPTIDE: B Natriuretic Peptide: 13.1 pg/mL (ref 0.0–100.0)

## 2024-05-14 MED ORDER — PERFLUTREN LIPID MICROSPHERE
1.0000 mL | INTRAVENOUS | Status: DC | PRN
  Administered 2024-05-14: 3 mL via INTRAVENOUS

## 2024-05-14 NOTE — Progress Notes (Signed)
 ADVANCED HF CLINIC NOTE  Primary Care: [place], [name], MD HF Cardiologist: Dr. Rolan   HPI: Keith [name] is a 73 y.o.male with history saddle PE/LLE DVT 09/21 s/p TPA complicated by hypotension requiring Levophed , multiple prior back surgeries, left adrenal adenoma, DM II, obesity, HTN.   Admitted 03/01/22 with CP. He was hypoxic requiring supplemental oxygen . V/Q scan with moderate to high probability of PE.  Lifelong DOAC recommended, started on Eliquis  5 BID (had been off eliquis  since 02/22).   Echo in 5/23 showed EF 55-60%, mild LVH, interventricular septum flattened in systole and diastole consistent with RV pressure and volume overload, RV severely enlarged with severely reduced function, RVSP 75 mmH, moderate to severe TR.   RHC/LHC in 5/23 showed no CAD, elevated right and left heart filling pressures, and pulmonary venous hypertension.  He was seen by CT surgery. Felt to be high risk for pulmonary embolectomy. Course complicated by AKI, Scr peaked at 3.34 and improved to 1.60 (baseline). Started on torsemide  20 mg M, W, F at discharge. Atenolol /HTCZ stopped.   Echo in 9/23 showed EF 55-60%, mildly decreased RV function with mild-moderate RV enlargement, unable to estimate PA systolic pressure, IVC normal. RHC was done in 10/23, showing moderate pulmonary arterial hypertension with PVR 5 WU.    V/Q scan in 7/24 showed right lung findings suggestive of chronic PE.   Follow up 8/24, volume stable. Opsumit  10 mg daily added, referred to [facility] for consideration of balloon pulmonary angioplasty (would not be good candidate for pulmonary thromboendarterectomy).   Echo 8/24 showed hyperdynamic LV with EF 70-75%, RV read as normal.   Ad[facility] from [facility] with ABLA related to GIB 12/24. Underwent EGD/colonoscopy showing erythematous mucosa;  2 polys that were removed, sigmoid colon diverticulosis, localized inflammation involving the distal rectum that was biopsied, nonbleeding external and  internal hemorrhoids, normal ileum and no blood noted throughout the GI lumen.   Referred to [facility]monology for CTEPH intervention. Has been seen and case reviewed at CTEPH conference in May 2025. Found to be a candidate for BPA, however possibly needs PA gram prior to.   He returns today for pulmonary hypertension follow up with wife. Overall feeling okay. NYHA III, limited by immobility and pain d/t spinal stenosis. Reports fatigue, orthopnea, and dyspnea on exertion. Does have hemorrhoid bleeding occasionally with constipation. Denies chest pain, palpitations, and dizziness. Walking in and out of the house, not far distances. Mostly uses wheelchair. Appetite okay. Compliant with all medications.  PMH: 1. Type 2 diabetes 2. Left adrenal adenoma 3. Obesity 4. HTN 5. H/o back surgery for spinal stenosis: Has had 3 surgeries, actually could not walk after the third surgery but has been doing PT.  6. Venous thromboembolism: Saddle PE in 9/21 with LLE DVT, this was treated with TPA.   - Recurrent PE in 5/23 after stopping DOAC.  - Echo (5/23): EF 55-60%, mild LVH, interventricular septum flattened in systole and diastole consistent with RV pressure and volume overload, RV severely enlarged with severely reduced function, RVSP 75 mmH, moderate to severe TR.  - R/LHC (5/23): RA 12, PA 48/34 (39), PCWP mean 40, V wave 42, Fick CO/CI 5.93/2.33, PVR ?< 1 WU.  - Echo (9/23): EF 55-60%, mildly decreased RV function with mild-moderate RV enlargement, unable to estimate PA systolic pressure, IVC normal.  - RHC (10/23): mean RA 9, PA 60/30 mean 36, mean PCWP 6, CI 2.46, PVR 5 WU - CTA chest (1/24): No PE - ANA/anti-cardiolipid/beta-2  glycoprotein/CCP negative,  ESR 50, RF 42 (2/24) - V/Q scan (7/24): right lung findings suggestive of chronic PE.  - Echo (8/24): EF hyperdynamic LV with EF 70-75%, RV read as normal.  7. CKD stage 3 8. OSA: Uses CPAP.   Review of Systems: All systems reviewed and negative  except as per HPI.   Current Outpatient Medications  Medication Sig Dispense Refill   amLODipine  (NORVASC ) 10 MG tablet TAKE 1 TABLET BY MOUTH DAILY 100 tablet 2   apixaban  (ELIQUIS ) 5 MG TABS tablet TAKE 1 TABLET BY MOUTH TWICE A DAY 180 tablet 0   carvedilol  (COREG ) 12.5 MG tablet TAKE 1 TABLET BY MOUTH TWICE  DAILY WITH MEALS 200 tablet 2   cholecalciferol  (VITAMIN D3) 25 MCG (1000 UNIT) tablet Take 1,000 Units by mouth daily.     DULoxetine  (CYMBALTA ) 30 MG capsule Take 1 capsule (30 mg total) by mouth daily. Due for follow up 90 capsule 0   ferrous sulfate  325 (65 FE) MG tablet TAKE 1 TABLET BY MOUTH EVERY DAY WITH BREAKFAST 90 tablet 1   gabapentin  (NEURONTIN ) 800 MG tablet Take 1 tablet (800 mg total) by mouth 4 (four) times daily for neuropathy. 120 tablet 0   HYDROcodone -acetaminophen  (NORCO) 10-325 MG tablet Take 1 tablet by mouth 4 (four) times daily as needed for pain 120 tablet 0   latanoprost  (XALATAN ) 0.005 % ophthalmic solution Place 1 drop into both eyes every evening. 2.5 mL 0   levETIRAcetam  (KEPPRA ) 250 MG tablet TAKE 1 TABLET BY MOUTH TWICE  DAILY 200 tablet 2   lidocaine  (LIDODERM ) 5 % Place 1 patch onto the skin daily as needed (pain). Remove & Discard patch within 12 hours or as directed by MD     omeprazole  (PRILOSEC) 40 MG capsule TAKE 1 CAPSULE BY MOUTH DAILY 100 capsule 2   OPSUMIT  10 MG tablet Take 1 tablet daily. 30 tablet 5   polyethylene glycol (MIRALAX  / GLYCOLAX ) 17 g packet Take 17 g by mouth daily as needed for mild constipation.     prednisoLONE  acetate (PRED FORTE ) 1 % ophthalmic suspension Starting after eye surgery, place 1 drop into the left eye 4 (four) times daily for 7 days, THEN 1 drop 3 (three) times daily for 7 days, THEN 1 drop 2 (two) times daily for 7 days, THEN 1 drop daily for 7 days. 10 mL 1   prednisoLONE  acetate (PRED FORTE ) 1 % ophthalmic suspension Place 1 drop into the left eye in the morning, at noon, and at bedtime.     Riociguat   (ADEMPAS ) 2 MG TABS Take 2 mg by mouth in the morning, at noon, and at bedtime. 90 tablet 11   rOPINIRole  (REQUIP ) 1 MG tablet TAKE 1 TABLET BY MOUTH AT  BEDTIME 100 tablet 1   rosuvastatin  (CRESTOR ) 20 MG tablet TAKE 1 TABLET BY MOUTH DAILY 100 tablet 2   senna-docusate (SENOKOT-S) 8.6-50 MG tablet Take 2 tablets by mouth at bedtime. 60 tablet 0   spironolactone  (ALDACTONE ) 25 MG tablet TAKE 1 TABLET BY MOUTH DAILY 100 tablet 2   torsemide  40 MG TABS Take 60 mg by mouth daily. 30 tablet 0   traZODone  (DESYREL ) 50 MG tablet TAKE 1/2 TO 1 TABLET BY MOUTH AT BEDTIME AS NEEDED FOR SLEEP 90 tablet 1   vitamin C  (ASCORBIC ACID ) 250 MG tablet Take 250 mg by mouth daily.     naloxone  (NARCAN ) nasal spray 4 mg/0.1 mL Place 1 spray into the nose once as needed (Patient not taking: Reported  on 05/14/2024) 2 each 3   No current facility-administered medications for this encounter.   Allergies  Allergen Reactions   Aspirin  Other (See Comments)    Irritates the stomach and the patient developed ulcers, also   Lisinopril  Other (See Comments)    Caused a body ache   Social History   Socioeconomic History   Marital status: Married    Spouse name: Wanda   Number of children: 3   Years of education: 4 years college   Highest education level: Bachelor's degree (e.g., BA, AB, BS)  Occupational History   Occupation: retired  Tobacco Use   Smoking status: Former    Current packs/day: 0.00    Average packs/day: 1 pack/day for 25.0 years (25.0 ttl pk-yrs)    Types: Cigarettes    Start date: 26    Quit date: 1995    Years since quitting: 30.6    Passive exposure: Never   Smokeless tobacco: Never  Vaping Use   Vaping status: Never Used  Substance and Sexual Activity   Alcohol use: Yes    Comment: holidays and special occasions   Drug use: Not Currently   Sexual activity: Yes    Birth control/protection: None  Other Topics Concern   Not on file  Social History Narrative   Married, 5 kids,  14 grand, 2 great grand, watch TV, Armed forces operational officer, traveling   Social Drivers of Corporate investment banker Strain: Low Risk  (04/06/2024)   Overall Financial Resource Strain (CARDIA)    Difficulty of Paying Living Expenses: Not hard at all  Food Insecurity: No Food Insecurity (04/06/2024)   Hunger Vital Sign    Worried About Running Out of Food in the Last Year: Never true    Ran Out of Food in the Last Year: Never true  Transportation Needs: No Transportation Needs (04/06/2024)   PRAPARE - Administrator, Civil Service (Medical): No    Lack of Transportation (Non-Medical): No  Physical Activity: Inactive (04/06/2024)   Exercise Vital Sign    Days of Exercise per Week: 0 days    Minutes of Exercise per Session: Not on file  Stress: No Stress Concern Present (04/06/2024)   [employer] of Occupational Health - Occupational Stress Questionnaire    Feeling of Stress: Not at all  Social Connections: Moderately Integrated (04/06/2024)   Social Connection and Isolation Panel    Frequency of Communication with Friends and Family: More than three times a week    Frequency of Social Gatherings with Friends and Family: More than three times a week    Attends Religious Services: More than 4 times per year    Active Member of [employer] or Organizations: No    Attends Banker Meetings: Not on file    Marital Status: Married  Catering manager Violence: Not At Risk (09/18/2023)   Humiliation, Afraid, Rape, and Kick questionnaire    Fear of Current or Ex-Partner: No    Emotionally Abused: No    Physically Abused: No    Sexually Abused: No   Family History  Problem Relation Age of Onset   Diabetes Mother    Asthma Mother    Breast cancer Mother    Colon polyps Mother    Hypertension Father    Stroke Father    Heart failure Brother    Stroke Brother    Ovarian cancer Maternal Grandmother    Hypertension Maternal Grandfather    Heart attack Maternal  Grandfather  Colon cancer Neg Hx    BP 110/60   Pulse 78   SpO2 99%   Wt Readings from Last 3 Encounters:  10/22/23 97.8 kg (215 lb 8 oz)  09/25/23 100.9 kg (222 lb 7.1 oz)  04/03/23 120 kg (264 lb 8 oz)   PHYSICAL EXAM: General: Well appearing. No distress on RA. WC bound Cardiac: JVP ~8cm. S1 and S2 present. No murmurs or rub. Abdomen: Soft, non-tender, non-distended.  Extremities: Warm and dry.  3+ BLE edema.  Neuro: Alert and oriented x3. Affect pleasant.   ReDs reading: 34 %, normal  ASSESSMENT & PLAN: 1.  Chronic diastolic CHF with RV failure:  Suspect RV strain from prior PEs.  Echo in 5/23 showed EF 55-60%, RV severely reduced, RVSP 75 mmHg, moderate to severe TR.  Echo in 9/23 showed EF 55-60%, mildly decreased RV function with mild-moderate RV enlargement, unable to estimate PA systolic pressure, IVC normal.  RHC in 10/23 showed moderate pulmonary arterial hypertension.  Echo (8/24) showed hyperdynamic LV with EF 70-75%, RV normal, unable to estimate PA pressure. - NYHA IIIb, compounded by poor mobility d/t spinal stenosis - Mildly hypervolemic on exam. ReDs on high end of normal.  - Increase torsemide  to 80 mg x2 days, then return to 60 mg daily. BMET/BNP today. Torsemide  is the only medication that is not routinely filled (last fill 10/2023). Reports daily compliance. - Continue spironolactone  25 mg daily.  - Continue Coreg  12.5 mg bid - Hold off on SGLT2i with recurrent UTIs. - Echo repeated today, will call with results  2. Pulmonary hypertension: RHC in 10/23 showed moderate pulmonary arterial hypertension. Based on history, this seems most likely to be chronic thromboembolic pulmonary hypertension.  He was found to have elevations in RF and ESR but negative CCP antibody.  Workup by rheumatology did not show signs of active inflammatory disease on exam. V/Q scan was finally done in 7/24, this was consistent with chronic pulmonary emboli in the right lung. Echo (8/24)  showed hyperdynamic LV, RV read as normal, PA pressures unable to be estimated.  - V/Q scan at [facility] 5/25 findings unchanged from prior study, consistent with CTEPH - Continue Eliquis  5 mg bid - Continue CPAP for OSA, intermittent compliance  - Continue adempas  2 mg tid - Continue Opsumit  10 mg daily.  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 Cannot rule this out. - [facility] note 5/25 states: Candidate for BPA at [facility] CTEPH conference. He has not heard from them since 02/2024. Discussed that he needs to call and make follow up appointment with [facility].  3. Tricuspid regurgitation: Moderate to severe on echo 05/23. Minimal on echo in 9/23. None on most recent echo (8/24).  4. History PE/DVT: PE/DVT 09/21 s/p TPA, required pressor support with NE. Recurrent PE in 5/23 off Eliquis . Now concern for chronic thromboembolic disease as above.  - Will need lifelong anticoagulation. Continue Eliquis . 5. HTN: BP controlled.  6. Spinal stenosis: Limited mobility at baseline. Currently at SNF working with PT. 7. OSA: Continue CPAP.  8. Obesity: There is no height or weight on file to calculate BMI. - He has stopped his Mounjaro  due to side effects.  Follow up in 2 months with APP (re-discuss CTEPH plan, assess volume)  [place] Diar Berkel, NP 05/14/2024

## 2024-05-14 NOTE — Patient Instructions (Addendum)
 INCREASE Torsemide  to 80 mg daily for 2 days, then go back to 60 mg daily.  Labs done today, your results will be available in MyChart, we will contact you for abnormal readings.  PLEASE CALL [facility] TO ARRANGE FOLLOW UP WITH THEM.   Your physician recommends that you schedule a follow-up appointment in: 2 months.  If you have any questions or concerns before your next appointment please send us  a message through [place] or call our office at [phone].    TO LEAVE A MESSAGE FOR THE NURSE SELECT OPTION 2, PLEASE LEAVE A MESSAGE INCLUDING: YOUR NAME DATE OF BIRTH CALL BACK NUMBER REASON FOR CALL**this is important as we prioritize the call backs  YOU WILL RECEIVE A CALL BACK THE SAME DAY AS LONG AS YOU CALL BEFORE 4:00 PM At the [facility] Failure Clinic, you and your health needs are our priority. As part of our continuing mission to provide you with exceptional heart care, we have created designated Provider Care Teams. These Care Teams include your primary Cardiologist (physician) and Advanced Practice Providers (APPs- Physician Assistants and Nurse Practitioners) who all work together to provide you with the care you need, when you need it.   You may see any of the following providers on your designated Care Team at your next follow up: Dr Toribio Fuel Dr Ezra Shuck Dr. Ria Commander Dr. Morene Brownie Amy Lenetta, NP Caffie Shed, [place] [facility] [place], [place] Beckey Coe, NP [place] Lee, NP Ellouise Class, NP Tinnie Redman, PharmD Jaun Bash, PharmD   Please be sure to bring in all your medications bottles to every appointment.    Thank you for choosing [facility]

## 2024-05-14 NOTE — Progress Notes (Signed)
 ReDS Vest / Clip - 05/14/24 1000       ReDS Vest / Clip   Station Marker D    Ruler Value 34    ReDS Value Range Low volume    ReDS Actual Value 34

## 2024-05-15 NOTE — Telephone Encounter (Signed)
 Called patient per Dr. Rolan with following echo results per Dr. Rolan:  EF 65-70% with normal RV, unable to estimate PA systolic pressure.  Needs followup in [facility] CTEPH program, please make sure he is reconnected.  Provided [facility] CTEPH program contact information below:  [facility] CTEPH Program [address] [facility] [address] [place], [place] [address]  Phone: [phone]  Pt verbalized understanding of above and plans on contacting [facility].

## 2024-05-25 ENCOUNTER — Other Ambulatory Visit (HOSPITAL_COMMUNITY): Payer: Self-pay

## 2024-05-25 MED ORDER — PREDNISOLONE ACETATE 1 % OP SUSP
OPHTHALMIC | 1 refills | Status: AC
  Filled 2024-05-25: qty 5, 28d supply, fill #0
  Filled 2024-06-20: qty 5, 7d supply, fill #1

## 2024-05-26 ENCOUNTER — Other Ambulatory Visit (HOSPITAL_COMMUNITY): Payer: Self-pay

## 2024-05-26 MED ORDER — GABAPENTIN 800 MG PO TABS
800.0000 mg | ORAL_TABLET | Freq: Four times a day (QID) | ORAL | 0 refills | Status: DC
  Filled 2024-06-03: qty 120, 30d supply, fill #0

## 2024-05-26 MED ORDER — NALOXONE HCL 4 MG/0.1ML NA LIQD
1.0000 | NASAL | 3 refills | Status: AC | PRN
  Filled 2024-05-26: qty 2, 30d supply, fill #0
  Filled 2024-06-03: qty 2, 2d supply, fill #0

## 2024-05-26 MED ORDER — HYDROCODONE-ACETAMINOPHEN 10-325 MG PO TABS
1.0000 | ORAL_TABLET | Freq: Four times a day (QID) | ORAL | 0 refills | Status: DC
  Filled 2024-05-26 – 2024-06-03 (×2): qty 120, 30d supply, fill #0
  Filled ????-??-??: fill #0

## 2024-05-27 ENCOUNTER — Other Ambulatory Visit: Payer: Self-pay | Admitting: Family Medicine

## 2024-05-28 ENCOUNTER — Other Ambulatory Visit (HOSPITAL_COMMUNITY): Payer: Self-pay

## 2024-05-29 ENCOUNTER — Other Ambulatory Visit (HOSPITAL_COMMUNITY): Payer: Self-pay | Admitting: Cardiology

## 2024-06-01 ENCOUNTER — Other Ambulatory Visit: Payer: Self-pay | Admitting: Family Medicine

## 2024-06-03 ENCOUNTER — Other Ambulatory Visit (HOSPITAL_COMMUNITY): Payer: Self-pay

## 2024-06-11 ENCOUNTER — Other Ambulatory Visit: Payer: Self-pay | Admitting: Family Medicine

## 2024-06-11 DIAGNOSIS — R258 Other abnormal involuntary movements: Secondary | ICD-10-CM

## 2024-06-11 DIAGNOSIS — I10 Essential (primary) hypertension: Secondary | ICD-10-CM

## 2024-06-20 ENCOUNTER — Other Ambulatory Visit (HOSPITAL_COMMUNITY): Payer: Self-pay

## 2024-06-22 ENCOUNTER — Other Ambulatory Visit (HOSPITAL_COMMUNITY): Payer: Self-pay

## 2024-06-23 ENCOUNTER — Other Ambulatory Visit (HOSPITAL_COMMUNITY): Payer: Self-pay

## 2024-06-23 MED ORDER — GABAPENTIN 800 MG PO TABS
800.0000 mg | ORAL_TABLET | Freq: Four times a day (QID) | ORAL | 0 refills | Status: DC | PRN
  Filled 2024-07-01: qty 120, 30d supply, fill #0

## 2024-06-23 MED ORDER — HYDROCODONE-ACETAMINOPHEN 10-325 MG PO TABS
1.0000 | ORAL_TABLET | Freq: Every day | ORAL | 0 refills | Status: DC
  Filled 2024-06-23 – 2024-07-01 (×2): qty 180, 30d supply, fill #0

## 2024-06-26 ENCOUNTER — Other Ambulatory Visit: Payer: Self-pay | Admitting: Internal Medicine

## 2024-07-01 ENCOUNTER — Other Ambulatory Visit (HOSPITAL_COMMUNITY): Payer: Self-pay

## 2024-07-02 ENCOUNTER — Other Ambulatory Visit (HOSPITAL_COMMUNITY): Payer: Self-pay

## 2024-07-05 ENCOUNTER — Other Ambulatory Visit (HOSPITAL_COMMUNITY): Payer: Self-pay | Admitting: Cardiology

## 2024-07-13 ENCOUNTER — Telehealth (HOSPITAL_COMMUNITY): Payer: Self-pay

## 2024-07-13 NOTE — Telephone Encounter (Signed)
 Called to confirm/remind patient of their appointment at the [facility] Clinic on 07/14/24.   Appointment:   [x] Confirmed  [] Left mess   [] No answer/No voice mail  [] VM Full/unable to leave message  [] Phone not in service  Patient reminded to bring all medications and/or complete list.  Confirmed patient has transportation. Gave directions, instructed to utilize valet parking.

## 2024-07-13 NOTE — Progress Notes (Incomplete)
 ADVANCED HF CLINIC NOTE  Primary Care: [place], [name], MD HF Cardiologist: Dr. Rolan   HPI: Keith [name] is a 73 y.o.male with history saddle PE/LLE DVT 09/21 s/p TPA complicated by hypotension requiring Levophed , multiple prior back surgeries, left adrenal adenoma, DM II, obesity, HTN.   Admitted 03/01/22 with CP. He was hypoxic requiring supplemental oxygen . V/Q scan with moderate to high probability of PE.  Lifelong DOAC recommended, started on Eliquis  5 BID (had been off eliquis  since 02/22).   Echo in 5/23 showed EF 55-60%, mild LVH, interventricular septum flattened in systole and diastole consistent with RV pressure and volume overload, RV severely enlarged with severely reduced function, RVSP 75 mmH, moderate to severe TR.   RHC/LHC in 5/23 showed no CAD, elevated right and left heart filling pressures, and pulmonary venous hypertension.  He was seen by CT surgery. Felt to be high risk for pulmonary embolectomy. Course complicated by AKI, Scr peaked at 3.34 and improved to 1.60 (baseline). Started on torsemide  20 mg M, W, F at discharge. Atenolol /HTCZ stopped.   Echo in 9/23 showed EF 55-60%, mildly decreased RV function with mild-moderate RV enlargement, unable to estimate PA systolic pressure, IVC normal. RHC was done in 10/23, showing moderate pulmonary arterial hypertension with PVR 5 WU.    V/Q scan in 7/24 showed right lung findings suggestive of chronic PE.   Follow up 8/24, volume stable. Opsumit  10 mg daily added, referred to [facility] for consideration of balloon pulmonary angioplasty (would not be good candidate for pulmonary thromboendarterectomy).   Echo 8/24 showed hyperdynamic LV with EF 70-75%, RV read as normal.   Admitted from [facility] with ABLA related to GIB 12/24. Underwent EGD/colonoscopy showing erythematous mucosa;  2 polys that were removed, sigmoid colon diverticulosis, localized inflammation involving the distal rectum that was biopsied, nonbleeding external and  internal hemorrhoids, normal ileum and no blood noted throughout the GI lumen.   Referred to [facility] for CTEPH intervention. Has been seen and case reviewed at CTEPH conference in May 2025. Found to be a candidate for BPA, however possibly needs PA gram prior to.   He returns today for pulmonary hypertension follow up with wife. Overall feeling okay. NYHA III, limited by immobility and pain d/t spinal stenosis. Reports fatigue, orthopnea, and dyspnea on exertion. Does have hemorrhoid bleeding occasionally with constipation. Denies chest pain, palpitations, and dizziness. Walking in and out of the house, not far distances. Mostly uses wheelchair. Appetite okay. Compliant with all medications.  PMH: 1. Type 2 diabetes 2. Left adrenal adenoma 3. Obesity 4. HTN 5. H/o back surgery for spinal stenosis: Has had 3 surgeries, actually could not walk after the third surgery but has been doing PT.  6. Venous thromboembolism: Saddle PE in 9/21 with LLE DVT, this was treated with TPA.   - Recurrent PE in 5/23 after stopping DOAC.  - Echo (5/23): EF 55-60%, mild LVH, interventricular septum flattened in systole and diastole consistent with RV pressure and volume overload, RV severely enlarged with severely reduced function, RVSP 75 mmH, moderate to severe TR.  - R/LHC (5/23): RA 12, PA 48/34 (39), PCWP mean 40, V wave 42, Fick CO/CI 5.93/2.33, PVR ?< 1 WU.  - Echo (9/23): EF 55-60%, mildly decreased RV function with mild-moderate RV enlargement, unable to estimate PA systolic pressure, IVC normal.  - RHC (10/23): mean RA 9, PA 60/30 mean 36, mean PCWP 6, CI 2.46, PVR 5 WU - CTA chest (1/24): No PE - ANA/anti-cardiolipid/beta-2  glycoprotein/CCP negative,  ESR 50, RF 42 (2/24) - V/Q scan (7/24): right lung findings suggestive of chronic PE.  - Echo (8/24): EF hyperdynamic LV with EF 70-75%, RV read as normal.  7. CKD stage 3 8. OSA: Uses CPAP.   Review of Systems: All systems reviewed and negative  except as per HPI.   Current Outpatient Medications  Medication Sig Dispense Refill   ADEMPAS  2 MG TABS TAKE 1 TABLET THREE TIMES A DAY (IN THE MORNING, AT NOON, AND AT BEDTIME) 90 tablet 11   amLODipine  (NORVASC ) 10 MG tablet TAKE 1 TABLET BY MOUTH DAILY 100 tablet 0   carvedilol  (COREG ) 12.5 MG tablet TAKE 1 TABLET BY MOUTH TWICE  DAILY WITH MEALS 200 tablet 2   cholecalciferol  (VITAMIN D3) 25 MCG (1000 UNIT) tablet Take 1,000 Units by mouth daily.     DULoxetine  (CYMBALTA ) 30 MG capsule TAKE 1 CAPSULE BY MOUTH DAILY 90 capsule 0   ELIQUIS  5 MG TABS tablet TAKE 1 TABLET BY MOUTH TWICE A DAY 180 tablet 0   ferrous sulfate  325 (65 FE) MG tablet TAKE 1 TABLET BY MOUTH EVERY DAY WITH BREAKFAST 90 tablet 1   gabapentin  (NEURONTIN ) 800 MG tablet Take 1 tablet (800 mg total) by mouth 4 (four) times daily for neuropathy. 120 tablet 0   gabapentin  (NEURONTIN ) 800 MG tablet Take 1 tablet (800 mg total) by mouth 4 (four) times daily as needed for neuropathy. 120 tablet 0   HYDROcodone -acetaminophen  (NORCO) 10-325 MG tablet Take 1 tablet by mouth 4 (four) times daily as needed for pain 120 tablet 0   HYDROcodone -acetaminophen  (NORCO) 10-325 MG tablet Take 1 tablet by mouth 6 (six) times daily as needed for pain. 180 tablet 0   latanoprost  (XALATAN ) 0.005 % ophthalmic solution Place 1 drop into both eyes every evening. 2.5 mL 0   levETIRAcetam  (KEPPRA ) 250 MG tablet TAKE 1 TABLET BY MOUTH TWICE  DAILY 200 tablet 2   lidocaine  (LIDODERM ) 5 % Place 1 patch onto the skin daily as needed (pain). Remove & Discard patch within 12 hours or as directed by MD     naloxone  (NARCAN ) nasal spray 4 mg/0.1 mL Place 1 spray into the nose once as needed (Patient not taking: Reported on 05/14/2024) 2 each 3   naloxone  (NARCAN ) nasal spray 4 mg/0.1 mL Place 1 spray into the nose as needed. 2 each 3   omeprazole  (PRILOSEC) 40 MG capsule TAKE 1 CAPSULE BY MOUTH DAILY 100 capsule 2   OPSUMIT  10 MG tablet Take 1 tablet daily. 30  tablet 5   polyethylene glycol (MIRALAX  / GLYCOLAX ) 17 g packet Take 17 g by mouth daily as needed for mild constipation.     prednisoLONE  acetate (PRED FORTE ) 1 % ophthalmic suspension Place 1 drop into the left eye in the morning, at noon, and at bedtime.     rOPINIRole  (REQUIP ) 1 MG tablet TAKE 1 TABLET BY MOUTH AT  BEDTIME 100 tablet 0   rosuvastatin  (CRESTOR ) 20 MG tablet TAKE 1 TABLET BY MOUTH DAILY 100 tablet 2   senna-docusate (SENOKOT-S) 8.6-50 MG tablet Take 2 tablets by mouth at bedtime. 60 tablet 0   spironolactone  (ALDACTONE ) 25 MG tablet TAKE 1 TABLET BY MOUTH DAILY 100 tablet 2   torsemide  40 MG TABS Take 60 mg by mouth daily. 30 tablet 0   traZODone  (DESYREL ) 50 MG tablet TAKE 1/2 TO 1 TABLET BY MOUTH AT BEDTIME AS NEEDED FOR SLEEP 90 tablet 1   vitamin C  (ASCORBIC ACID ) 250 MG  tablet Take 250 mg by mouth daily.     No current facility-administered medications for this visit.   Allergies  Allergen Reactions   Aspirin  Other (See Comments)    Irritates the stomach and the patient developed ulcers, also   Lisinopril  Other (See Comments)    Caused a body ache   Social History   Socioeconomic History   Marital status: Married    Spouse name: Wanda   Number of children: 3   Years of education: 4 years college   Highest education level: Bachelor's degree (e.g., BA, AB, BS)  Occupational History   Occupation: retired  Tobacco Use   Smoking status: Former    Current packs/day: 0.00    Average packs/day: 1 pack/day for 25.0 years (25.0 ttl pk-yrs)    Types: Cigarettes    Start date: 40    Quit date: 1995    Years since quitting: 30.7    Passive exposure: Never   Smokeless tobacco: Never  Vaping Use   Vaping status: Never Used  Substance and Sexual Activity   Alcohol use: Yes    Comment: holidays and special occasions   Drug use: Not Currently   Sexual activity: Yes    Birth control/protection: None  Other Topics Concern   Not on file  Social History  Narrative   Married, 5 kids, 14 grand, 2 great grand, watch TV, Armed forces operational officer, traveling   Social Drivers of Corporate investment banker Strain: Low Risk  (04/06/2024)   Overall Financial Resource Strain (CARDIA)    Difficulty of Paying Living Expenses: Not hard at all  Food Insecurity: No Food Insecurity (04/06/2024)   Hunger Vital Sign    Worried About Running Out of Food in the Last Year: Never true    Ran Out of Food in the Last Year: Never true  Transportation Needs: No Transportation Needs (04/06/2024)   PRAPARE - Administrator, Civil Service (Medical): No    Lack of Transportation (Non-Medical): No  Physical Activity: Inactive (04/06/2024)   Exercise Vital Sign    Days of Exercise per Week: 0 days    Minutes of Exercise per Session: Not on file  Stress: No Stress Concern Present (04/06/2024)   [employer] of Occupational Health - Occupational Stress Questionnaire    Feeling of Stress: Not at all  Social Connections: Moderately Integrated (04/06/2024)   Social Connection and Isolation Panel    Frequency of Communication with Friends and Family: More than three times a week    Frequency of Social Gatherings with Friends and Family: More than three times a week    Attends Religious Services: More than 4 times per year    Active Member of [employer] or Organizations: No    Attends Banker Meetings: Not on file    Marital Status: Married  Catering manager Violence: Not At Risk (09/18/2023)   Humiliation, Afraid, Rape, and Kick questionnaire    Fear of Current or Ex-Partner: No    Emotionally Abused: No    Physically Abused: No    Sexually Abused: No   Family History  Problem Relation Age of Onset   Diabetes Mother    Asthma Mother    Breast cancer Mother    Colon polyps Mother    Hypertension Father    Stroke Father    Heart failure Brother    Stroke Brother    Ovarian cancer Maternal Grandmother    Hypertension Maternal Grandfather     Heart  attack Maternal Grandfather    Colon cancer Neg Hx    There were no vitals taken for this visit.  Wt Readings from Last 3 Encounters:  10/22/23 97.8 kg (215 lb 8 oz)  09/25/23 100.9 kg (222 lb 7.1 oz)  04/03/23 120 kg (264 lb 8 oz)   PHYSICAL EXAM: General: Well appearing. No distress on RA. WC bound Cardiac: JVP ~8cm. S1 and S2 present. No murmurs or rub. Abdomen: Soft, non-tender, non-distended.  Extremities: Warm and dry.  3+ BLE edema.  Neuro: Alert and oriented x3. Affect pleasant.   ReDs reading: 34 %, normal  ASSESSMENT & PLAN: 1.  Chronic diastolic CHF with RV failure:  Suspect RV strain from prior PEs.  Echo in 5/23 showed EF 55-60%, RV severely reduced, RVSP 75 mmHg, moderate to severe TR.  Echo in 9/23 showed EF 55-60%, mildly decreased RV function with mild-moderate RV enlargement, unable to estimate PA systolic pressure, IVC normal.  RHC in 10/23 showed moderate pulmonary arterial hypertension.  Echo (8/24) showed hyperdynamic LV with EF 70-75%, RV normal, unable to estimate PA pressure. - NYHA IIIb, compounded by poor mobility d/t spinal stenosis - Mildly hypervolemic on exam. ReDs on high end of normal.  - Increase torsemide  to 80 mg x2 days, then return to 60 mg daily. BMET/BNP today. Torsemide  is the only medication that is not routinely filled (last fill 10/2023). Reports daily compliance. - Continue spironolactone  25 mg daily.  - Continue Coreg  12.5 mg bid - Hold off on SGLT2i with recurrent UTIs. - Echo repeated today, will call with results  2. Pulmonary hypertension: RHC in 10/23 showed moderate pulmonary arterial hypertension. Based on history, this seems most likely to be chronic thromboembolic pulmonary hypertension.  He was found to have elevations in RF and ESR but negative CCP antibody.  Workup by rheumatology did not show signs of active inflammatory disease on exam. V/Q scan was finally done in 7/24, this was consistent with chronic pulmonary emboli in  the right lung. Echo (8/24) showed hyperdynamic LV, RV read as normal, PA pressures unable to be estimated.  - V/Q scan at [facility] 5/25 findings unchanged from prior study, consistent with CTEPH - Continue Eliquis  5 mg bid - Continue CPAP for OSA, intermittent compliance  - Continue adempas  2 mg tid - Continue Opsumit  10 mg daily.  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 Cannot rule this out. - [facility] note 5/25 states: Candidate for BPA at [facility] CTEPH conference. He has not heard from them since 02/2024. Discussed that he needs to call and make follow up appointment with [facility].  3. Tricuspid regurgitation: Moderate to severe on echo 05/23. Minimal on echo in 9/23. None on most recent echo (8/24).  4. History PE/DVT: PE/DVT 09/21 s/p TPA, required pressor support with NE. Recurrent PE in 5/23 off Eliquis . Now concern for chronic thromboembolic disease as above.  - Will need lifelong anticoagulation. Continue Eliquis . 5. HTN: BP controlled.  6. Spinal stenosis: Limited mobility at baseline. Currently at SNF working with PT. 7. OSA: Continue CPAP.  8. Obesity: There is no height or weight on file to calculate BMI. - He has stopped his Mounjaro  due to side effects.  Follow up in 2 months with APP (re-discuss CTEPH plan, assess volume)  Harlene CHRISTELLA Gainer, FNP 07/13/2024

## 2024-07-14 ENCOUNTER — Encounter (HOSPITAL_COMMUNITY): Payer: Self-pay

## 2024-07-14 ENCOUNTER — Other Ambulatory Visit (HOSPITAL_COMMUNITY): Payer: Self-pay

## 2024-07-14 ENCOUNTER — Other Ambulatory Visit: Payer: Self-pay | Admitting: Family Medicine

## 2024-07-14 ENCOUNTER — Ambulatory Visit (HOSPITAL_COMMUNITY)
Admission: RE | Admit: 2024-07-14 | Discharge: 2024-07-14 | Disposition: A | Discharge status: Home / Self Care | Source: Ambulatory Visit | Attending: Family Medicine | Admitting: Family Medicine

## 2024-07-14 DIAGNOSIS — Z86718 Personal history of other venous thrombosis and embolism: Secondary | ICD-10-CM | POA: Diagnosis not present

## 2024-07-14 DIAGNOSIS — I130 Hypertensive heart and chronic kidney disease with heart failure and stage 1 through stage 4 chronic kidney disease, or unspecified chronic kidney disease: Secondary | ICD-10-CM | POA: Insufficient documentation

## 2024-07-14 DIAGNOSIS — Z6834 Body mass index (BMI) 34.0-34.9, adult: Secondary | ICD-10-CM | POA: Diagnosis not present

## 2024-07-14 DIAGNOSIS — N183 Chronic kidney disease, stage 3 unspecified: Secondary | ICD-10-CM | POA: Diagnosis not present

## 2024-07-14 DIAGNOSIS — Z79899 Other long term (current) drug therapy: Secondary | ICD-10-CM | POA: Insufficient documentation

## 2024-07-14 DIAGNOSIS — R002 Palpitations: Secondary | ICD-10-CM | POA: Diagnosis not present

## 2024-07-14 DIAGNOSIS — G4733 Obstructive sleep apnea (adult) (pediatric): Secondary | ICD-10-CM | POA: Insufficient documentation

## 2024-07-14 DIAGNOSIS — Z7901 Long term (current) use of anticoagulants: Secondary | ICD-10-CM | POA: Insufficient documentation

## 2024-07-14 DIAGNOSIS — I2724 Chronic thromboembolic pulmonary hypertension: Secondary | ICD-10-CM

## 2024-07-14 DIAGNOSIS — M4800 Spinal stenosis, site unspecified: Secondary | ICD-10-CM | POA: Insufficient documentation

## 2024-07-14 DIAGNOSIS — E1122 Type 2 diabetes mellitus with diabetic chronic kidney disease: Secondary | ICD-10-CM | POA: Insufficient documentation

## 2024-07-14 DIAGNOSIS — E8770 Fluid overload, unspecified: Secondary | ICD-10-CM | POA: Insufficient documentation

## 2024-07-14 DIAGNOSIS — I071 Rheumatic tricuspid insufficiency: Secondary | ICD-10-CM | POA: Diagnosis not present

## 2024-07-14 DIAGNOSIS — Z993 Dependence on wheelchair: Secondary | ICD-10-CM | POA: Diagnosis not present

## 2024-07-14 DIAGNOSIS — Z86711 Personal history of pulmonary embolism: Secondary | ICD-10-CM | POA: Insufficient documentation

## 2024-07-14 DIAGNOSIS — M7989 Other specified soft tissue disorders: Secondary | ICD-10-CM | POA: Diagnosis not present

## 2024-07-14 DIAGNOSIS — I5032 Chronic diastolic (congestive) heart failure: Secondary | ICD-10-CM | POA: Insufficient documentation

## 2024-07-14 DIAGNOSIS — Z87891 Personal history of nicotine dependence: Secondary | ICD-10-CM | POA: Diagnosis not present

## 2024-07-14 DIAGNOSIS — K219 Gastro-esophageal reflux disease without esophagitis: Secondary | ICD-10-CM

## 2024-07-14 DIAGNOSIS — K644 Residual hemorrhoidal skin tags: Secondary | ICD-10-CM | POA: Diagnosis not present

## 2024-07-14 DIAGNOSIS — E669 Obesity, unspecified: Secondary | ICD-10-CM | POA: Insufficient documentation

## 2024-07-14 DIAGNOSIS — I272 Pulmonary hypertension, unspecified: Secondary | ICD-10-CM | POA: Diagnosis not present

## 2024-07-14 DIAGNOSIS — M48061 Spinal stenosis, lumbar region without neurogenic claudication: Secondary | ICD-10-CM

## 2024-07-14 DIAGNOSIS — K5730 Diverticulosis of large intestine without perforation or abscess without bleeding: Secondary | ICD-10-CM | POA: Diagnosis not present

## 2024-07-14 DIAGNOSIS — I10 Essential (primary) hypertension: Secondary | ICD-10-CM

## 2024-07-14 MED ORDER — POTASSIUM CHLORIDE CRYS ER 20 MEQ PO TBCR
20.0000 meq | EXTENDED_RELEASE_TABLET | Freq: Every day | ORAL | 3 refills | Status: DC
  Filled 2024-07-14 – 2024-07-20 (×3): qty 90, 90d supply, fill #0

## 2024-07-14 MED ORDER — TORSEMIDE 20 MG PO TABS
80.0000 mg | ORAL_TABLET | Freq: Every day | ORAL | 3 refills | Status: DC
  Filled 2024-07-14 – 2024-07-20 (×3): qty 120, 30d supply, fill #0

## 2024-07-14 MED ORDER — POTASSIUM CHLORIDE CRYS ER 20 MEQ PO TBCR: 20.0000 meq | EXTENDED_RELEASE_TABLET | Freq: Every day | ORAL | 3 refills | Status: DC

## 2024-07-14 MED ORDER — TORSEMIDE 20 MG PO TABS: 80.0000 mg | ORAL_TABLET | Freq: Every day | ORAL | 3 refills | Status: DC

## 2024-07-14 NOTE — Progress Notes (Signed)
 ReDS Vest / Clip - 07/14/24 1600       ReDS Vest / Clip   Station Marker C    Ruler Value 40    ReDS Value Range Low volume    ReDS Actual Value 23

## 2024-07-14 NOTE — Patient Instructions (Addendum)
 Medication Changes:  INCREASE TORSEMIDE  TO 60MG  TWICE DAILY FOR 3 DAYS   AND THEN GO TO 80MG  ONCE DAILY   START POTASSIUM 20MEQ ONCE DAILY   Lab Work:  PLEASE RETURN FOR LABS AS SCHEDULED IN 7 DAYS   Testing/Procedures:  Your physician has requested that you have an ankle brachial index (ABI). During this test an ultrasound and blood pressure cuff are used to evaluate the arteries that supply the arms and legs with blood. Allow thirty minutes for this exam. There are no restrictions or special instructions.  Please note: We ask at that you not bring children with you during ultrasound (echo/ vascular) testing. Due to room size and safety concerns, children are not allowed in the ultrasound rooms during exams. Our front office staff cannot provide observation of children in our lobby area while testing is being conducted. An adult accompanying a patient to their appointment will only be allowed in the ultrasound room at the discretion of the ultrasound technician under special circumstances. We apologize for any inconvenience.  Follow-Up in: 3-4 MONTHS WITH DR. ROLAN PLEASE CALL OUR OFFICE AROUND NOVEMBER TO GET SCHEDULED FOR YOUR APPOINTMENT. PHONE NUMBER IS [phone] OPTION 2   At the [facility], you and your health needs are our priority. We have a designated team specialized in the treatment of Heart Failure. This Care Team includes your primary Heart Failure Specialized Cardiologist (physician), Advanced Practice Providers (APPs- Physician Assistants and Nurse Practitioners), and Pharmacist who all work together to provide you with the care you need, when you need it.   Dr. Toribio Fuel Dr. Ezra ROLAN Dr. Ria Commander Dr. Odis Brownie Greig Mosses, NP Caffie Shed, [place] [facility] [place], [place] Beckey Coe, NP [place] Lee, NP Tinnie Redman, PharmD   Please be sure to bring in all your medications bottles to every appointment.   Need to  Contact Us :  If you have any questions or concerns before your next appointment please send us  a message through [place] or call our office at [phone].    TO LEAVE A MESSAGE FOR THE NURSE SELECT OPTION 2, PLEASE LEAVE A MESSAGE INCLUDING: YOUR NAME DATE OF BIRTH CALL BACK NUMBER REASON FOR CALL**this is important as we prioritize the call backs  YOU WILL RECEIVE A CALL BACK THE SAME DAY AS LONG AS YOU CALL BEFORE 4:00 PM

## 2024-07-14 NOTE — Addendum Note (Signed)
 Encounter addended by: Dante Jeannine HERO, CMA on: 07/14/2024 4:23 PM  Actions taken: Visit diagnoses modified, Order list changed, Diagnosis association updated, Flowsheet accepted, Clinical Note Signed, Charge Capture section accepted

## 2024-07-17 ENCOUNTER — Other Ambulatory Visit (HOSPITAL_COMMUNITY): Payer: Self-pay

## 2024-07-20 ENCOUNTER — Other Ambulatory Visit (HOSPITAL_COMMUNITY): Payer: Self-pay

## 2024-07-20 ENCOUNTER — Telehealth: Payer: Self-pay | Admitting: Surgery

## 2024-07-20 MED ORDER — POTASSIUM CHLORIDE CRYS ER 20 MEQ PO TBCR
20.0000 meq | EXTENDED_RELEASE_TABLET | Freq: Every day | ORAL | 3 refills | Status: AC
  Filled 2024-07-20: qty 90, 90d supply, fill #0

## 2024-07-20 MED ORDER — TORSEMIDE 20 MG PO TABS
ORAL_TABLET | ORAL | 3 refills | Status: DC
  Filled 2024-07-20: qty 120, 28d supply, fill #0
  Filled 2024-08-12: qty 120, 2d supply, fill #1

## 2024-07-20 NOTE — Telephone Encounter (Signed)
 Patient called concerned that his prescription had not gone through to pharmacy after his last visit. I have resent to [facility] per his request.

## 2024-07-21 ENCOUNTER — Other Ambulatory Visit (HOSPITAL_COMMUNITY)

## 2024-07-21 ENCOUNTER — Other Ambulatory Visit (HOSPITAL_COMMUNITY): Payer: Self-pay | Admitting: Family Medicine

## 2024-07-21 ENCOUNTER — Other Ambulatory Visit (HOSPITAL_COMMUNITY): Payer: Self-pay

## 2024-07-21 ENCOUNTER — Ambulatory Visit (HOSPITAL_COMMUNITY)
Admission: RE | Admit: 2024-07-21 | Discharge: 2024-07-21 | Disposition: A | Discharge status: Home / Self Care | Source: Ambulatory Visit | Attending: Internal Medicine | Admitting: Internal Medicine

## 2024-07-21 ENCOUNTER — Ambulatory Visit (HOSPITAL_BASED_OUTPATIENT_CLINIC_OR_DEPARTMENT_OTHER)
Admission: RE | Admit: 2024-07-21 | Discharge: 2024-07-21 | Disposition: A | Discharge status: Home / Self Care | Source: Ambulatory Visit | Attending: Family Medicine | Admitting: Family Medicine

## 2024-07-21 ENCOUNTER — Ambulatory Visit (HOSPITAL_COMMUNITY): Payer: Self-pay | Admitting: Family Medicine

## 2024-07-21 DIAGNOSIS — F17200 Nicotine dependence, unspecified, uncomplicated: Secondary | ICD-10-CM | POA: Diagnosis not present

## 2024-07-21 DIAGNOSIS — G4733 Obstructive sleep apnea (adult) (pediatric): Secondary | ICD-10-CM

## 2024-07-21 DIAGNOSIS — I272 Pulmonary hypertension, unspecified: Secondary | ICD-10-CM

## 2024-07-21 DIAGNOSIS — E785 Hyperlipidemia, unspecified: Secondary | ICD-10-CM | POA: Diagnosis not present

## 2024-07-21 DIAGNOSIS — I5032 Chronic diastolic (congestive) heart failure: Secondary | ICD-10-CM | POA: Diagnosis present

## 2024-07-21 DIAGNOSIS — R200 Anesthesia of skin: Secondary | ICD-10-CM

## 2024-07-21 DIAGNOSIS — M48061 Spinal stenosis, lumbar region without neurogenic claudication: Secondary | ICD-10-CM

## 2024-07-21 DIAGNOSIS — I10 Essential (primary) hypertension: Secondary | ICD-10-CM

## 2024-07-21 DIAGNOSIS — R202 Paresthesia of skin: Secondary | ICD-10-CM

## 2024-07-21 DIAGNOSIS — I071 Rheumatic tricuspid insufficiency: Secondary | ICD-10-CM

## 2024-07-21 DIAGNOSIS — I110 Hypertensive heart disease with heart failure: Secondary | ICD-10-CM | POA: Insufficient documentation

## 2024-07-21 DIAGNOSIS — L819 Disorder of pigmentation, unspecified: Secondary | ICD-10-CM | POA: Insufficient documentation

## 2024-07-21 DIAGNOSIS — E119 Type 2 diabetes mellitus without complications: Secondary | ICD-10-CM | POA: Diagnosis not present

## 2024-07-21 DIAGNOSIS — E669 Obesity, unspecified: Secondary | ICD-10-CM

## 2024-07-21 DIAGNOSIS — I2724 Chronic thromboembolic pulmonary hypertension: Secondary | ICD-10-CM

## 2024-07-21 DIAGNOSIS — Z86711 Personal history of pulmonary embolism: Secondary | ICD-10-CM

## 2024-07-21 LAB — BASIC METABOLIC PANEL WITH GFR
Anion gap: 14 (ref 5–15)
BUN: 19 mg/dL (ref 8–23)
CO2: 24 mmol/L (ref 22–32)
Calcium: 10.1 mg/dL (ref 8.9–10.3)
Chloride: 103 mmol/L (ref 98–111)
Creatinine, Ser: 1.32 mg/dL — ABNORMAL HIGH (ref 0.61–1.24)
GFR, Estimated: 57 mL/min — ABNORMAL LOW (ref >60)
Glucose, Bld: 157 mg/dL — ABNORMAL HIGH (ref 70–99)
Potassium: 4.1 mmol/L (ref 3.5–5.1)
Sodium: 141 mmol/L (ref 135–145)

## 2024-07-22 LAB — VAS US ABI WITH/WO TBI
Left ABI: 1.01
Right ABI: 1.03

## 2024-07-24 ENCOUNTER — Ambulatory Visit (HOSPITAL_COMMUNITY): Payer: Self-pay | Admitting: Family Medicine

## 2024-07-27 ENCOUNTER — Other Ambulatory Visit (HOSPITAL_COMMUNITY): Payer: Self-pay

## 2024-07-27 MED ORDER — GABAPENTIN 800 MG PO TABS
800.0000 mg | ORAL_TABLET | Freq: Four times a day (QID) | ORAL | 0 refills | Status: DC
  Filled 2024-07-27: qty 31, 7d supply, fill #0
  Filled 2024-07-27: qty 89, 23d supply, fill #0

## 2024-07-27 MED ORDER — HYDROCODONE-ACETAMINOPHEN 10-325 MG PO TABS
1.0000 | ORAL_TABLET | Freq: Every day | ORAL | 0 refills | Status: DC
  Filled 2024-07-27 – 2024-07-30 (×2): qty 180, 30d supply, fill #0

## 2024-07-28 ENCOUNTER — Other Ambulatory Visit (HOSPITAL_COMMUNITY): Payer: Self-pay

## 2024-07-30 ENCOUNTER — Other Ambulatory Visit (HOSPITAL_COMMUNITY): Payer: Self-pay

## 2024-08-05 ENCOUNTER — Other Ambulatory Visit (HOSPITAL_COMMUNITY): Payer: Self-pay | Admitting: Cardiology

## 2024-08-18 ENCOUNTER — Other Ambulatory Visit: Payer: Self-pay | Admitting: Cardiology

## 2024-08-21 ENCOUNTER — Other Ambulatory Visit (HOSPITAL_COMMUNITY): Payer: Self-pay

## 2024-08-21 MED ORDER — HYDROCODONE-ACETAMINOPHEN 10-325 MG PO TABS
1.0000 | ORAL_TABLET | Freq: Every day | ORAL | 0 refills | Status: DC
  Filled 2024-08-27: qty 180, 30d supply, fill #0

## 2024-08-21 MED ORDER — GABAPENTIN 800 MG PO TABS
800.0000 mg | ORAL_TABLET | Freq: Four times a day (QID) | ORAL | 0 refills | Status: DC
  Filled 2024-08-27: qty 120, 30d supply, fill #0

## 2024-08-27 ENCOUNTER — Other Ambulatory Visit (HOSPITAL_COMMUNITY): Payer: Self-pay

## 2024-09-12 ENCOUNTER — Other Ambulatory Visit: Payer: Self-pay | Admitting: Cardiology

## 2024-09-12 ENCOUNTER — Other Ambulatory Visit: Payer: Self-pay | Admitting: Family Medicine

## 2024-09-12 DIAGNOSIS — I10 Essential (primary) hypertension: Secondary | ICD-10-CM

## 2024-09-12 DIAGNOSIS — R258 Other abnormal involuntary movements: Secondary | ICD-10-CM

## 2024-09-12 DIAGNOSIS — G894 Chronic pain syndrome: Secondary | ICD-10-CM

## 2024-09-18 ENCOUNTER — Other Ambulatory Visit: Payer: Self-pay

## 2024-09-18 ENCOUNTER — Emergency Department (HOSPITAL_COMMUNITY)

## 2024-09-18 ENCOUNTER — Inpatient Hospital Stay (HOSPITAL_COMMUNITY)
Admission: EM | Admit: 2024-09-18 | Discharge: 2024-09-21 | DRG: 291 | Disposition: A | Discharge status: Home / Self Care | Attending: Internal Medicine | Admitting: Internal Medicine

## 2024-09-18 DIAGNOSIS — I509 Heart failure, unspecified: Principal | ICD-10-CM

## 2024-09-18 DIAGNOSIS — K219 Gastro-esophageal reflux disease without esophagitis: Secondary | ICD-10-CM | POA: Diagnosis present

## 2024-09-18 DIAGNOSIS — I130 Hypertensive heart and chronic kidney disease with heart failure and stage 1 through stage 4 chronic kidney disease, or unspecified chronic kidney disease: Secondary | ICD-10-CM | POA: Diagnosis present

## 2024-09-18 DIAGNOSIS — M4800 Spinal stenosis, site unspecified: Secondary | ICD-10-CM

## 2024-09-18 DIAGNOSIS — Z87891 Personal history of nicotine dependence: Secondary | ICD-10-CM | POA: Diagnosis not present

## 2024-09-18 DIAGNOSIS — D631 Anemia in chronic kidney disease: Secondary | ICD-10-CM | POA: Diagnosis present

## 2024-09-18 DIAGNOSIS — J9601 Acute respiratory failure with hypoxia: Secondary | ICD-10-CM | POA: Diagnosis present

## 2024-09-18 DIAGNOSIS — I5033 Acute on chronic diastolic (congestive) heart failure: Principal | ICD-10-CM | POA: Diagnosis present

## 2024-09-18 DIAGNOSIS — I2692 Saddle embolus of pulmonary artery without acute cor pulmonale: Secondary | ICD-10-CM

## 2024-09-18 DIAGNOSIS — J40 Bronchitis, not specified as acute or chronic: Secondary | ICD-10-CM | POA: Diagnosis present

## 2024-09-18 DIAGNOSIS — Z7901 Long term (current) use of anticoagulants: Secondary | ICD-10-CM | POA: Diagnosis not present

## 2024-09-18 DIAGNOSIS — I5032 Chronic diastolic (congestive) heart failure: Secondary | ICD-10-CM

## 2024-09-18 DIAGNOSIS — G8929 Other chronic pain: Secondary | ICD-10-CM | POA: Diagnosis present

## 2024-09-18 DIAGNOSIS — E1140 Type 2 diabetes mellitus with diabetic neuropathy, unspecified: Secondary | ICD-10-CM | POA: Diagnosis present

## 2024-09-18 DIAGNOSIS — I50810 Right heart failure, unspecified: Secondary | ICD-10-CM

## 2024-09-18 DIAGNOSIS — Z833 Family history of diabetes mellitus: Secondary | ICD-10-CM | POA: Diagnosis not present

## 2024-09-18 DIAGNOSIS — E1122 Type 2 diabetes mellitus with diabetic chronic kidney disease: Secondary | ICD-10-CM | POA: Diagnosis present

## 2024-09-18 DIAGNOSIS — W050XXA Fall from non-moving wheelchair, initial encounter: Secondary | ICD-10-CM | POA: Diagnosis present

## 2024-09-18 DIAGNOSIS — Z1152 Encounter for screening for COVID-19: Secondary | ICD-10-CM | POA: Diagnosis not present

## 2024-09-18 DIAGNOSIS — I5082 Biventricular heart failure: Secondary | ICD-10-CM | POA: Diagnosis present

## 2024-09-18 DIAGNOSIS — N179 Acute kidney failure, unspecified: Secondary | ICD-10-CM | POA: Diagnosis present

## 2024-09-18 DIAGNOSIS — G4733 Obstructive sleep apnea (adult) (pediatric): Secondary | ICD-10-CM | POA: Diagnosis present

## 2024-09-18 DIAGNOSIS — I2782 Chronic pulmonary embolism: Secondary | ICD-10-CM | POA: Diagnosis present

## 2024-09-18 DIAGNOSIS — N1831 Chronic kidney disease, stage 3a: Secondary | ICD-10-CM | POA: Diagnosis present

## 2024-09-18 DIAGNOSIS — Z8249 Family history of ischemic heart disease and other diseases of the circulatory system: Secondary | ICD-10-CM | POA: Diagnosis not present

## 2024-09-18 DIAGNOSIS — I2724 Chronic thromboembolic pulmonary hypertension: Secondary | ICD-10-CM | POA: Diagnosis present

## 2024-09-18 DIAGNOSIS — Z6835 Body mass index (BMI) 35.0-35.9, adult: Secondary | ICD-10-CM | POA: Diagnosis not present

## 2024-09-18 DIAGNOSIS — Z91158 Patient's noncompliance with renal dialysis for other reason: Secondary | ICD-10-CM | POA: Diagnosis not present

## 2024-09-18 DIAGNOSIS — J1569 Pneumonia due to other gram-negative bacteria: Secondary | ICD-10-CM | POA: Diagnosis present

## 2024-09-18 LAB — CBC WITH DIFFERENTIAL/PLATELET
Abs Immature Granulocytes: 0.05 K/uL (ref 0.00–0.07)
Basophils Absolute: 0 K/uL (ref 0.0–0.1)
Basophils Relative: 0 %
Eosinophils Absolute: 0.1 K/uL (ref 0.0–0.5)
Eosinophils Relative: 1 %
HCT: 37.1 % — ABNORMAL LOW (ref 39.0–52.0)
Hemoglobin: 11.1 g/dL — ABNORMAL LOW (ref 13.0–17.0)
Immature Granulocytes: 1 %
Lymphocytes Relative: 4 %
Lymphs Abs: 0.4 K/uL — ABNORMAL LOW (ref 0.7–4.0)
MCH: 26.9 pg (ref 26.0–34.0)
MCHC: 29.9 g/dL — ABNORMAL LOW (ref 30.0–36.0)
MCV: 90 fL (ref 80.0–100.0)
Monocytes Absolute: 1.1 K/uL — ABNORMAL HIGH (ref 0.1–1.0)
Monocytes Relative: 12 %
Neutro Abs: 7.4 K/uL (ref 1.7–7.7)
Neutrophils Relative %: 82 %
Platelets: 279 K/uL (ref 150–400)
RBC: 4.12 MIL/uL — ABNORMAL LOW (ref 4.22–5.81)
RDW: 17 % — ABNORMAL HIGH (ref 11.5–15.5)
WBC: 9 K/uL (ref 4.0–10.5)
nRBC: 0 % (ref 0.0–0.2)

## 2024-09-18 LAB — COMPREHENSIVE METABOLIC PANEL WITH GFR
ALT: 18 U/L (ref 0–44)
AST: 26 U/L (ref 15–41)
Albumin: 3.6 g/dL (ref 3.5–5.0)
Alkaline Phosphatase: 82 U/L (ref 38–126)
Anion gap: 11 (ref 5–15)
BUN: 21 mg/dL (ref 8–23)
CO2: 25 mmol/L (ref 22–32)
Calcium: 9.2 mg/dL (ref 8.9–10.3)
Chloride: 105 mmol/L (ref 98–111)
Creatinine, Ser: 1.33 mg/dL — ABNORMAL HIGH (ref 0.61–1.24)
GFR, Estimated: 57 mL/min — ABNORMAL LOW (ref >60)
Glucose, Bld: 158 mg/dL — ABNORMAL HIGH (ref 70–99)
Potassium: 4 mmol/L (ref 3.5–5.1)
Sodium: 141 mmol/L (ref 135–145)
Total Bilirubin: 0.5 mg/dL (ref 0.0–1.2)
Total Protein: 7.4 g/dL (ref 6.5–8.1)

## 2024-09-18 LAB — I-STAT VENOUS BLOOD GAS, ED
Acid-Base Excess: 3 mmol/L — ABNORMAL HIGH (ref 0.0–2.0)
Bicarbonate: 26.6 mmol/L (ref 20.0–28.0)
Calcium, Ion: 1.16 mmol/L (ref 1.15–1.40)
HCT: 35 % — ABNORMAL LOW (ref 39.0–52.0)
Hemoglobin: 11.9 g/dL — ABNORMAL LOW (ref 13.0–17.0)
O2 Saturation: 93 %
Potassium: 4 mmol/L (ref 3.5–5.1)
Sodium: 144 mmol/L (ref 135–145)
TCO2: 28 mmol/L (ref 22–32)
pCO2, Ven: 37.9 mmHg — ABNORMAL LOW (ref 44–60)
pH, Ven: 7.453 — ABNORMAL HIGH (ref 7.25–7.43)
pO2, Ven: 62 mmHg — ABNORMAL HIGH (ref 32–45)

## 2024-09-18 LAB — PROCALCITONIN: Procalcitonin: 0.41 ng/mL

## 2024-09-18 LAB — RESP PANEL BY RT-PCR (RSV, FLU A&B, COVID)  RVPGX2
Influenza A by PCR: NEGATIVE
Influenza B by PCR: NEGATIVE
Resp Syncytial Virus by PCR: NEGATIVE
SARS Coronavirus 2 by RT PCR: NEGATIVE

## 2024-09-18 LAB — TROPONIN I (HIGH SENSITIVITY)
Troponin I (High Sensitivity): 20 ng/L — ABNORMAL HIGH (ref <18)
Troponin I (High Sensitivity): 28 ng/L — ABNORMAL HIGH (ref <18)

## 2024-09-18 LAB — STREP PNEUMONIAE URINARY ANTIGEN: Strep Pneumo Urinary Antigen: NEGATIVE

## 2024-09-18 LAB — BRAIN NATRIURETIC PEPTIDE: B Natriuretic Peptide: 46.8 pg/mL (ref 0.0–100.0)

## 2024-09-18 MED ORDER — SODIUM CHLORIDE 0.9% FLUSH: 3.0000 mL | INTRAVENOUS | Status: DC | PRN

## 2024-09-18 MED ORDER — SODIUM CHLORIDE 0.9 % IV SOLN: 250.0000 mL | INTRAVENOUS | Status: AC | PRN

## 2024-09-18 MED ORDER — LEVETIRACETAM 250 MG PO TABS
250.0000 mg | ORAL_TABLET | Freq: Two times a day (BID) | ORAL | Status: DC
  Administered 2024-09-18 – 2024-09-21 (×6): 250 mg via ORAL
  Filled 2024-09-18 (×8): qty 1

## 2024-09-18 MED ORDER — DULOXETINE HCL 30 MG PO CPEP
30.0000 mg | ORAL_CAPSULE | Freq: Every day | ORAL | Status: DC
  Administered 2024-09-19 – 2024-09-21 (×3): 30 mg via ORAL
  Filled 2024-09-18 (×3): qty 1

## 2024-09-18 MED ORDER — SODIUM CHLORIDE 0.9% FLUSH: 3.0000 mL | Freq: Two times a day (BID) | INTRAVENOUS | Status: DC

## 2024-09-18 MED ORDER — RIOCIGUAT 2 MG PO TABS
2.0000 mg | ORAL_TABLET | Freq: Three times a day (TID) | ORAL | Status: DC
  Filled 2024-09-18: qty 1

## 2024-09-18 MED ORDER — GABAPENTIN 400 MG PO CAPS
800.0000 mg | ORAL_CAPSULE | Freq: Four times a day (QID) | ORAL | Status: DC
  Administered 2024-09-18 – 2024-09-21 (×10): 800 mg via ORAL
  Filled 2024-09-18 (×7): qty 2
  Filled 2024-09-18: qty 8
  Filled 2024-09-18 (×2): qty 2

## 2024-09-18 MED ORDER — ROPINIROLE HCL 0.5 MG PO TABS
1.0000 mg | ORAL_TABLET | Freq: Every day | ORAL | Status: DC
  Administered 2024-09-18 – 2024-09-20 (×3): 1 mg via ORAL
  Filled 2024-09-18: qty 2
  Filled 2024-09-18: qty 1
  Filled 2024-09-18: qty 2

## 2024-09-18 MED ORDER — APIXABAN 5 MG PO TABS
5.0000 mg | ORAL_TABLET | Freq: Two times a day (BID) | ORAL | Status: DC
  Administered 2024-09-18 – 2024-09-21 (×6): 5 mg via ORAL
  Filled 2024-09-18 (×6): qty 1

## 2024-09-18 MED ORDER — CARVEDILOL 12.5 MG PO TABS
12.5000 mg | ORAL_TABLET | Freq: Two times a day (BID) | ORAL | Status: DC
  Administered 2024-09-19 – 2024-09-21 (×5): 12.5 mg via ORAL
  Filled 2024-09-18 (×5): qty 1

## 2024-09-18 MED ORDER — MACITENTAN 10 MG PO TABS
10.0000 mg | ORAL_TABLET | Freq: Every day | ORAL | Status: DC
  Administered 2024-09-19 – 2024-09-21 (×3): 10 mg via ORAL
  Filled 2024-09-18 (×3): qty 1

## 2024-09-18 MED ORDER — SODIUM CHLORIDE 0.9 % IV SOLN
500.0000 mg | INTRAVENOUS | Status: DC
  Administered 2024-09-18 – 2024-09-19 (×2): 500 mg via INTRAVENOUS
  Filled 2024-09-18 (×2): qty 5

## 2024-09-18 MED ORDER — FUROSEMIDE 10 MG/ML IJ SOLN
60.0000 mg | Freq: Two times a day (BID) | INTRAMUSCULAR | Status: AC
  Administered 2024-09-19 (×2): 60 mg via INTRAVENOUS
  Filled 2024-09-18 (×2): qty 6

## 2024-09-18 MED ORDER — SPIRONOLACTONE 25 MG PO TABS
25.0000 mg | ORAL_TABLET | Freq: Every day | ORAL | Status: DC
  Administered 2024-09-19 – 2024-09-21 (×3): 25 mg via ORAL
  Filled 2024-09-18 (×3): qty 1

## 2024-09-18 MED ORDER — SODIUM CHLORIDE 0.9 % IV SOLN
2.0000 g | INTRAVENOUS | Status: DC
  Administered 2024-09-18 – 2024-09-20 (×3): 2 g via INTRAVENOUS
  Filled 2024-09-18 (×3): qty 20

## 2024-09-18 MED ORDER — ROSUVASTATIN CALCIUM 20 MG PO TABS
20.0000 mg | ORAL_TABLET | Freq: Every day | ORAL | Status: DC
  Administered 2024-09-19 – 2024-09-21 (×3): 20 mg via ORAL
  Filled 2024-09-18 (×3): qty 1

## 2024-09-18 MED ORDER — FUROSEMIDE 10 MG/ML IJ SOLN
60.0000 mg | Freq: Once | INTRAMUSCULAR | Status: AC
  Administered 2024-09-18: 60 mg via INTRAVENOUS
  Filled 2024-09-18: qty 6

## 2024-09-18 MED ORDER — IOHEXOL 350 MG/ML SOLN
75.0000 mL | Freq: Once | INTRAVENOUS | Status: AC | PRN
  Administered 2024-09-18: 75 mL via INTRAVENOUS

## 2024-09-18 NOTE — ED Provider Notes (Signed)
 [place] [facility]MENT AT [facility] Provider Note   CSN: [id number] Arrival date & time: 09/18/24  1220     Patient presents with: Shortness of Breath and Fall   Keith [name] is a 73 y.o. male.   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  Patient reports for the last 1 week he has had URI symptoms with significant nasal congestion, productive cough and gradually worsening shortness of breath.  He has not worn his CPAP in a few weeks initially because he said he was being lazy but then with the URI it is not comfortable.  Today he was supposed to go to his pain management visit and barely made it to the car and became so weak he had to go back inside.  He was then trying to transfer from his wheelchair to the bed and was not able to make it and slid down to the floor.  When EMS arrived they report his oxygen  saturation was in the low 80s.  Patient was placed on 6 L with improvement of oxygen  sats.  He does not wear oxygen  at home.  He denies using any inhalers at home.  He has not had any chest pain but has noted some worsening swelling in his legs.  He has been compliant with his torsemide  but does not follow a compliant heart healthy diet.  He has not noticed any black stools or signs of bleeding.  But did have a history of having anemia due to a bleeding polyp that was addressed approximately 1 year ago.  The history is provided by the patient and medical records.  Shortness of Breath Fall Associated symptoms include shortness of breath.       Prior to Admission medications  Medication Sig Start Date End Date Taking?  Authorizing Provider  ADEMPAS  2 MG TABS TAKE 1 TABLET THREE TIMES A DAY (IN THE MORNING, AT NOON, AND AT BEDTIME) 05/29/24   Rolan Ezra RAMAN, MD  amLODipine  (NORVASC ) 10 MG tablet TAKE 1 TABLET BY MOUTH DAILY 06/12/24   Jordan, [name], MD  carvedilol  (COREG ) 12.5 MG tablet TAKE 1 TABLET (12.5MG  TOTAL) BY MOUTH TWICE A DAY WITH MEALS 08/05/24   Rolan Ezra RAMAN, MD  cholecalciferol  (VITAMIN D3) 25 MCG (1000 UNIT) tablet Take 1,000 Units by mouth daily.    [provider]  DULoxetine  (CYMBALTA ) 30 MG capsule TAKE 1 CAPSULE BY MOUTH DAILY 06/02/24   Jordan, [name], MD  ELIQUIS  5 MG TABS tablet TAKE 1 TABLET BY MOUTH TWICE A DAY 07/06/24   Rolan Ezra RAMAN, MD  ferrous sulfate  325 (65 FE) MG tablet TAKE 1 TABLET BY MOUTH EVERY DAY WITH BREAKFAST 03/01/23   Jordan, [name], MD  gabapentin  (NEURONTIN ) 800 MG tablet Take 1 tablet (800 mg total) by mouth 4 (four) times daily for neuropathy. 05/26/24     gabapentin  (NEURONTIN ) 800 MG tablet Take 1 tablet (800 mg total) by mouth 4 (four) times daily as needed for neuropathy. 06/23/24     gabapentin  (NEURONTIN ) 800 MG tablet Take 1 tablet (800 mg total) by mouth 4 (four) times daily for neuropathy. 08/21/24     HYDROcodone -acetaminophen  (NORCO)  10-325 MG tablet Take 1 tablet by mouth 4 (four) times daily as needed for pain 04/22/24     HYDROcodone -acetaminophen  (NORCO) 10-325 MG tablet Take 1 tablet by mouth 6 (six) times daily as needed for pain. 08/21/24     latanoprost  (XALATAN ) 0.005 % ophthalmic solution Place 1 drop into both eyes every evening. 03/23/24     levETIRAcetam  (KEPPRA ) 250 MG tablet TAKE 1 TABLET BY MOUTH TWICE  DAILY 12/16/23   Jordan, [name], MD  lidocaine  (LIDODERM ) 5 % Place 1 patch onto the skin daily as needed (pain). Remove & Discard patch within 12 hours or as directed by MD    [provider]  naloxone  (NARCAN ) nasal spray 4 mg/0.1 mL Place 1 spray into the nose once as needed 11/05/23     naloxone  (NARCAN ) nasal spray 4  mg/0.1 mL Place 1 spray into the nose as needed. 05/26/24     omeprazole  (PRILOSEC) 40 MG capsule TAKE 1 CAPSULE BY MOUTH DAILY 11/01/23   Jordan, [name], MD  OPSUMIT  10 MG tablet Take 1 tablet daily. 04/08/24   Rolan Ezra RAMAN, MD  polyethylene glycol (MIRALAX  / GLYCOLAX ) 17 g packet Take 17 g by mouth daily as needed for mild constipation. 09/06/21   Love, Sharlet RAMAN, PA-C  potassium chloride  SA (KLOR-CON  M) 20 MEQ tablet Take 1 tablet (20 mEq total) by mouth daily. 07/20/24   Milford, Harlene HERO, FNP  prednisoLONE  acetate (PRED FORTE ) 1 % ophthalmic suspension Place 1 drop into the left eye in the morning, at noon, and at bedtime.    [provider]  rOPINIRole  (REQUIP ) 1 MG tablet TAKE 1 TABLET BY MOUTH AT  BEDTIME 06/12/24   Jordan, [name], MD  rosuvastatin  (CRESTOR ) 20 MG tablet TAKE 1 TABLET BY MOUTH DAILY 05/28/24   Jordan, [name], MD  senna-docusate (SENOKOT-S) 8.6-50 MG tablet Take 2 tablets by mouth at bedtime. 09/21/23   [name] Leatrice FERNS, MD  spironolactone  (ALDACTONE ) 25 MG tablet TAKE 1 TABLET BY MOUTH DAILY 08/19/24   McLean, Dalton S, MD  torsemide  (DEMADEX ) 20 MG tablet Take 3 tablets (60 mg total) by mouth 2 (two) times daily for 3 days, THEN 4 tablets (80 mg total) daily thereafter. 07/20/24 08/19/24  Glena Harlene HERO, FNP  traZODone  (DESYREL ) 50 MG tablet TAKE 1/2 TO 1 TABLET BY MOUTH AT BEDTIME AS NEEDED FOR SLEEP 07/31/23   Lovorn, Megan, MD  vitamin C  (ASCORBIC ACID ) 250 MG tablet Take 250 mg by mouth daily.    [provider]    Allergies: Aspirin  and Lisinopril     Review of Systems  Respiratory:  Positive for shortness of breath.     Updated Vital Signs BP 113/67   Pulse 95   Temp 99.3 F (37.4 C) (Axillary)   Resp 18   Ht 6' (1.829 m)   Wt 117.9 kg   SpO2 100%   BMI 35.26 kg/m   Physical Exam Vitals and nursing note reviewed.  Constitutional:      General: He is not in acute distress.    Appearance: He is well-developed.  HENT:      Head: Normocephalic and atraumatic.  Eyes:     Conjunctiva/sclera: Conjunctivae normal.     Pupils: Pupils are equal, round, and reactive to light.  Cardiovascular:     Rate and Rhythm: Normal rate and regular rhythm.     Pulses: Normal pulses.     Heart sounds: No murmur heard. Pulmonary:     Effort:  Pulmonary effort is normal. Tachypnea present. No respiratory distress.     Breath sounds: Examination of the right-lower field reveals decreased breath sounds. Examination of the left-lower field reveals decreased breath sounds. Decreased breath sounds and rales present. No wheezing.  Abdominal:     General: There is distension.     Palpations: Abdomen is soft.     Tenderness: There is no abdominal tenderness. There is no guarding or rebound.  Musculoskeletal:        General: No tenderness. Normal range of motion.     Cervical back: Normal range of motion and neck supple.     Right lower leg: Edema present.     Left lower leg: Edema present.     Comments: 3+ pitting edema to the bilateral knees  Skin:    General: Skin is warm and dry.     Findings: No erythema or rash.  Neurological:     Mental Status: He is alert and oriented to person, place, and time.  Psychiatric:        Behavior: Behavior normal.     (all labs ordered are listed, but only abnormal results are displayed) Labs Reviewed  CBC WITH DIFFERENTIAL/PLATELET - Abnormal; Notable for the following components:      Result Value   RBC 4.12 (*)    Hemoglobin 11.1 (*)    HCT 37.1 (*)    MCHC 29.9 (*)    RDW 17.0 (*)    Lymphs Abs 0.4 (*)    Monocytes Absolute 1.1 (*)    All other components within normal limits  COMPREHENSIVE METABOLIC PANEL WITH GFR - Abnormal; Notable for the following components:   Glucose, Bld 158 (*)    Creatinine, Ser 1.33 (*)    GFR, Estimated 57 (*)    All other components within normal limits  I-STAT VENOUS BLOOD GAS, ED - Abnormal; Notable for the following components:   pH, Ven 7.453  (*)    pCO2, Ven 37.9 (*)    pO2, Ven 62 (*)    Acid-Base Excess 3.0 (*)    HCT 35.0 (*)    Hemoglobin 11.9 (*)    All other components within normal limits  TROPONIN I (HIGH SENSITIVITY) - Abnormal; Notable for the following components:   Troponin I (High Sensitivity) 20 (*)    All other components within normal limits  RESP PANEL BY RT-PCR (RSV, FLU A&B, COVID)  RVPGX2  BRAIN NATRIURETIC PEPTIDE  TROPONIN I (HIGH SENSITIVITY)    EKG: EKG Interpretation Date/Time:  Friday September 18 2024 12:32:37 EST Ventricular Rate:  95 PR Interval:  224 QRS Duration:  98 QT Interval:  366 QTC Calculation: 461 R Axis:   -41  Text Interpretation: Sinus rhythm Prolonged PR interval Left axis deviation No significant change since last tracing Confirmed by Doretha Folks ([id number]) on 09/18/2024 12:56:10 PM  Radiology: [facility] Chest Port 1 View Result Date: 09/18/2024 EXAM: 1 VIEW(S) XRAY OF THE CHEST 09/18/2024 12:54:30 PM COMPARISON: 09/18/2023 CLINICAL HISTORY: sob FINDINGS: LUNGS AND PLEURA: No focal consolidation. No pleural effusion. No pneumothorax. HEART AND MEDIASTINUM: Cardiomegaly. BONES AND SOFT TISSUES: Scoliosis. Partial visualization of lumbar surgical hardware. IMPRESSION: 1. Cardiomegaly. Electronically signed by: Dorethia Molt MD 09/18/2024 01:30 PM EST RP Workstation: [id number]     Procedures   Medications Ordered in the ED  furosemide  (LASIX ) injection 60 mg (has no administration in time range)  Medical Decision Making Amount and/or Complexity of Data Reviewed Independent Historian: EMS External Data Reviewed: notes. Labs: ordered. Decision-making details documented in ED Course. Radiology: ordered and independent interpretation performed. Decision-making details documented in ED Course. ECG/medicine tests: ordered and independent interpretation performed. Decision-making details documented in ED Course.  Risk Prescription drug  management. Decision regarding hospitalization.   Pt with multiple medical problems and comorbidities and presenting today with a complaint that caries a high risk for morbidity and mortality.  Here today with the above complaints.  Concern for pneumonia, CHF exacerbation, anemia, new renal failure.  Lower suspicion for recurrent PE, tamponade or cardiogenic shock.  No evidence of sepsis at this time.  Patient is requiring oxygen  to maintain sats at 96%. I independently interpreted patient's labs and EKG which shows no acute changes.  CBC with stable hemoglobin and white count, venous blood gas today without evidence of CO2 retention with normal bicarb, CMP without acute findings other than mild AKI with creatinine of 1.3, troponin mildly elevated at 20 and respiratory viral panel is negative.  I have independently visualized and interpreted pt's images today.  Chest x-ray without acute findings.  Radiology reports cardiomegaly.  Patient given IV Lasix .  Given patient's new oxygen  requirement, significant fluid overload feel that he needs admission for diuresis.  Patient is comfortable with this plan.  Consulted hospitalist for admission.  CRITICAL CARE Performed by: Wynton Hufstetler Total critical care time: 30 minutes Critical care time was exclusive of separately billable procedures and treating other patients. Critical care was necessary to treat or prevent imminent or life-threatening deterioration. Critical care was time spent personally by me on the following activities: development of treatment plan with patient and/or surrogate as well as nursing, discussions with consultants, evaluation of patient's response to treatment, examination of patient, obtaining history from patient or surrogate, ordering and performing treatments and interventions, ordering and review of laboratory studies, ordering and review of radiographic studies, pulse oximetry and re-evaluation of patient's condition.         Final diagnoses:  Acute on chronic congestive heart failure, unspecified heart failure type (HCC)  Acute respiratory failure with hypoxia [facility])    ED Discharge Orders     None          Doretha Folks, MD 09/18/24 1527

## 2024-09-18 NOTE — Progress Notes (Signed)
 Cardiology consult canceled per Dr.Patel.    Jon Nat Hails, PA-C 09/18/2024, 7:51 PM [phone] [facility] [address] [place], [place] [address]

## 2024-09-18 NOTE — ED Notes (Signed)
 Patient transported to CT

## 2024-09-18 NOTE — ED Triage Notes (Signed)
 Pt BIB [facility] c/o shortness of breath and falling onto his buttocks while moving from the wheelchair to his chair lift. Pt reports no head trauma or LOC. Pt does take Eliquis  for prior PEs. Pt c/o a common cold x 1 week. Pt does not wear O2 at home, but upon EMS arrival, pt's O2 sat was 81%. EMS placed pt on 6L [place] and the O2 improved to 96%. Pt reports a productive cough with yellow sputum. Pt has PMHx of CHF. Pt A/Ox4 and vitally stable with EMS.

## 2024-09-18 NOTE — Hospital Course (Addendum)
 SABRA

## 2024-09-18 NOTE — Consult Note (Signed)
 NAME:  NYZAIAH KAI, MRN:  [id number], DOB:  [birth year], LOS: 0 ADMISSION DATE:  09/18/2024, CONSULTATION DATE:  12/12 REFERRING MD:  Dr. Darra TRH, CHIEF COMPLAINT:  SOB   History of Present Illness:  73 year old male with PMH as below, which is significant for massive PE in 2021 treated with TPA, DM, adrenal adenoma, DM, OSA o nCPAPand back surgeries. He was admitted with PE again in 2023 after his anticoagulation had been stopped and was started on lifelong DOAC. Subsequent imaging has raised concern for chronic thromboembolic disease. He was diagnosed with CTEPH and is followed at [facility]. Where he has been considered for balloon pulmonary angioplasty. He is followed by the advanced heart failure clinic here at [facility]. He is now s/p BPA x 3 at [facility]. Most recently 10/24  with plans to repeat on 12/26   He presented to [facility] ED 12/12 with complaints of shortness of breath. Onset of upper respiratory infection symptoms one week prior including congestion and productive cough. Also described lower extremity edema. Has been non-compliant with CPAP as well for a few weeks prior to presentation. He became weak and fell out of his wheelchair at home prompting EMS call. Upon their arrival he was satting in the low 80s which improved with 6L of oxygen  via nasal cannula. Workup in the ED included CXR which was unremarkable with the exception of cardiomegaly. Lasix  60mg  administered. CT angiogram was ordered and he was admitted to the hospitalist service. PCCM has been asked to assist in evaluation of the patient.   At baseline he is wheelchair-bound after lumbar surgery Remote smoker quit 94   Pertinent  Medical History   has a past medical history of Allergy, CHF (congestive heart failure) (HCC), Chronic back pain, Diabetes mellitus without complication (HCC), DNR (do not resuscitate) (11/19/2020), Duodenal ulcer hemorrhage (08/29/2014), ED (erectile dysfunction), Esophageal stricture  (08/30/2014), Glucose intolerance (impaired glucose tolerance), Hiatal hernia (08/30/2014), Hypertension, Hypokalemia (04/11/2013), MRSA carrier (08/30/2014), Obesity, Osteoarthritis, Pneumonia, Pulmonary embolism (HCC) (06/2020), Scoliosis (2016), Spinal stenosis of lumbar region, and Urinary tract infection (04/11/2013).   Significant Hospital Events: Including procedures, antibiotic start and stop dates in addition to other pertinent events   CTA 12/12 >>  Interim History / Subjective:  Breathing improved, diuresed 1 L with Lasix  Down to 2 L nasal cannula. Denies chest pain Complains of increasing bipedal edema  Objective    Blood pressure 113/72, pulse 93, temperature 99.3 F (37.4 C), temperature source Axillary, resp. rate 15, height 6' (1.829 m), weight 117.9 kg, SpO2 99%.       No intake or output data in the 24 hours ending 09/18/24 1706 Filed Weights   09/18/24 1302  Weight: 117.9 kg    Examination: General: Obese elderly man, lying supine, no distress on 2 L nasal cannula HENT: No JVD, no pallor or icterus Lungs: Decreased breath sounds bilateral, faint rhonchi on right, no accessory muscle use Cardiovascular: S1-S2 distant, no murmurs Abdomen: Obese soft, nontender Extremities: 2+ edema Neuro: Awake, interactive, was able to provide entire history, power lower extremities 2/5 upper EXTR 5/5 GU: Clear urine in suction canister  Labs show normal electrolytes, flat troponin, low BNP, no leukocytosis, BUN/creatinine 21/1.3  Resolved problem list   Assessment and Plan    Acute respiratory failure with hypoxia -differential includes acute bronchitis, improvement in hypoxia post diuresis suggest that this may be related to cor pulmonale , doubt recurrent PE especially since he is compliant with anticoagulation .  Doubt  underlying COPD, he quit smoking 1994 CTEPH status post balloon pulm angioplasty x 3 in 2 Saddle PE in 9/21 with LLE DVT, this was treated with TPA.    - Recurrent PE in 5/23 after stopping DOAC.  - Echo (5/23): EF 55-60%, mild LVH, interventricular septum flattened in systole and diastole consistent with RV pressure and volume overload, RV severely enlarged with severely reduced function, RVSP 75 mmH, moderate to severe TR.  - R/LHC (5/23): RA 12, PA 48/34 (39), PCWP mean 40, V wave 42, Fick CO/CI 5.93/2.33, PVR ?< 1 WU.  - Echo (9/23): EF 55-60%, mildly decreased RV function with mild-moderate RV enlargement, unable to estimate PA systolic pressure, IVC normal.  - RHC (10/23): mean RA 9, PA 60/30 mean 36, mean PCWP 6, CI 2.46, PVR 5 WU - CTA chest (1/24): No PE - ANA/anti-cardiolipid/beta-2  glycoprotein/CCP negative, ESR 50, RF 42 (2/24) - V/Q scan (7/24): right lung findings suggestive of chronic PE.  - Echo (8/24): EF hyperdynamic LV with EF 70-75%, RV read as normal.  - Echo (8/25): 65-70%, normal RV, unable to estimate PA systolic pressure. - RHC ([facility], 07/10/24): RA 2, PA 36/16 (25), PCWP 2, PVR 4.1, CO/CI (Fick) 5.9/3.3, Pa Sat 63%  Plan: - CT angiogram reviewed, negative for PE -  focus on diuresis and bronchodilator therapy , given patchy ground glass opacities can treat with antibiotic, viral studies are negative - Continue apixaban  and Opsumit  -He does not need transfer to [facility] he can keep his scheduled appointment for BPA #4 on 12/26     HFpEF with right heart failure - Okay to use IV Lasix  and eventually back on his home torsemide  60 mg twice daily -he admits to taking only once daily  Spinal stenosis: limited mobility.  Wheelchair-bound - per primary   OSA on CPAP - Encourage compliance with nocturnal CPAP    Labs   CBC: Recent Labs  Lab 09/18/24 1314 09/18/24 1325  WBC 9.0  --   NEUTROABS 7.4  --   HGB 11.1* 11.9*  HCT 37.1* 35.0*  MCV 90.0  --   PLT 279  --     Basic Metabolic Panel: Recent Labs  Lab 09/18/24 1314 09/18/24 1325  NA 141 144  K 4.0 4.0  CL 105  --   CO2 25  --   GLUCOSE 158*   --   BUN 21  --   CREATININE 1.33*  --   CALCIUM  9.2  --    GFR: Estimated Creatinine Clearance: 66.5 mL/min (A) (by C-G formula based on SCr of 1.33 mg/dL (H)). Recent Labs  Lab 09/18/24 1314  WBC 9.0    Liver Function Tests: Recent Labs  Lab 09/18/24 1314  AST 26  ALT 18  ALKPHOS 82  BILITOT 0.5  PROT 7.4  ALBUMIN  3.6   No results for input(s): LIPASE, AMYLASE in the last 168 hours. No results for input(s): AMMONIA in the last 168 hours.  ABG    Component Value Date/Time   PHART 7.429 03/02/2022 1215   PHART 7.438 03/02/2022 1215   PCO2ART 46.7 03/02/2022 1215   PCO2ART 46.1 03/02/2022 1215   PO2ART 84 03/02/2022 1215   PO2ART 84 03/02/2022 1215   HCO3 26.6 09/18/2024 1325   TCO2 28 09/18/2024 1325   ACIDBASEDEF 2.0 06/27/2020 0419   O2SAT 93 09/18/2024 1325     Coagulation Profile: No results for input(s): INR, PROTIME in the last 168 hours.  Cardiac Enzymes: No results for input(s): CKTOTAL, CKMB, CKMBINDEX,  TROPONINI in the last 168 hours.  HbA1C: HbA1c, POC (prediabetic range)  Date/Time Value Ref Range Status  07/18/2022 11:34 AM 6.2 5.7 - 6.4 % Final   Hgb A1c MFr Bld  Date/Time Value Ref Range Status  07/01/2023 10:24 AM 5.1 4.6 - 6.5 % Final    Comment:    Glycemic Control Guidelines for People with Diabetes:Non Diabetic:  <6%Goal of Therapy: <7%Additional Action Suggested:  >8%   12/24/2022 09:16 AM 5.5 4.8 - 5.6 % Final    Comment:    (NOTE)         Prediabetes: 5.7 - 6.4         Diabetes: >6.4         Glycemic control for adults with diabetes: <7.0     CBG: No results for input(s): GLUCAP in the last 168 hours.  Review of Systems:   As noted in HPI Baseline wheelchair-bound after second lumbar surgery Reports preceding URI symptoms  Past Medical History:  He,  has a past medical history of Allergy, CHF (congestive heart failure) (HCC), Chronic back pain, Diabetes mellitus without complication (HCC), DNR (do  not resuscitate) (11/19/2020), Duodenal ulcer hemorrhage (08/29/2014), ED (erectile dysfunction), Esophageal stricture (08/30/2014), Glucose intolerance (impaired glucose tolerance), Hiatal hernia (08/30/2014), Hypertension, Hypokalemia (04/11/2013), MRSA carrier (08/30/2014), Obesity, Osteoarthritis, Pneumonia, Pulmonary embolism (HCC) (06/2020), Scoliosis (2016), Spinal stenosis of lumbar region, and Urinary tract infection (04/11/2013).   Surgical History:   Past Surgical History:  Procedure Laterality Date   BACK SURGERY  08/07/2021   x 2   BIOPSY  09/20/2023   Procedure: BIOPSY;  Surgeon: Legrand Victory LITTIE DOUGLAS, MD;  Location: [facility] ENDOSCOPY;  Service: Gastroenterology;;   BIOPSY  09/21/2023   Procedure: BIOPSY;  Surgeon: San Sandor GAILS, DO;  Location: [facility] ENDOSCOPY;  Service: Gastroenterology;;  rectal   COLONOSCOPY  2008   COLONOSCOPY WITH PROPOFOL  N/A 09/21/2023   Procedure: COLONOSCOPY WITH PROPOFOL ;  Surgeon: San Sandor GAILS, DO;  Location: [facility] ENDOSCOPY;  Service: Gastroenterology;  Laterality: N/A;   ESOPHAGOGASTRODUODENOSCOPY N/A 08/29/2014   Procedure: ESOPHAGOGASTRODUODENOSCOPY (EGD);  Surgeon: Princella CHRISTELLA Nida, MD;  Location: [facility] ENDOSCOPY;  Service: Endoscopy;  Laterality: N/A;   ESOPHAGOGASTRODUODENOSCOPY (EGD) WITH PROPOFOL  N/A 09/20/2023   Procedure: ESOPHAGOGASTRODUODENOSCOPY (EGD) WITH PROPOFOL ;  Surgeon: Legrand Victory LITTIE DOUGLAS, MD;  Location: [facility] ENDOSCOPY;  Service: Gastroenterology;  Laterality: N/A;   LAMINECTOMY N/A 07/28/2018   Procedure: THORACIC ELEVEN- THORACIC TWELVE POSTERIOR DECOMPRESSION LAMINECTOMY;  Surgeon: Cheryle Debby LABOR, MD;  Location: [facility] OR;  Service: Neurosurgery;  Laterality: N/A;   LUMBAR LAMINECTOMY     NASAL POLYP SURGERY     ORIF ANKLE FRACTURE Right 07/01/2020   Procedure: OPEN REDUCTION INTERNAL FIXATION (ORIF) ANKLE FRACTURE. TRIMALLEOLAR;  Surgeon: Josefina Chew, MD;  Location: [facility] OR;  Service: Orthopedics;  Laterality: Right;   POLYPECTOMY   09/21/2023   Procedure: POLYPECTOMY;  Surgeon: San Sandor GAILS, DO;  Location: [facility] ENDOSCOPY;  Service: Gastroenterology;;  cecal   RIGHT HEART CATH N/A 07/06/2022   Procedure: RIGHT HEART CATH;  Surgeon: Rolan Ezra RAMAN, MD;  Location: [facility] INVASIVE CV LAB;  Service: Cardiovascular;  Laterality: N/A;   RIGHT/LEFT HEART CATH AND CORONARY ANGIOGRAPHY N/A 03/02/2022   Procedure: RIGHT/LEFT HEART CATH AND CORONARY ANGIOGRAPHY;  Surgeon: Claudene Pacific, MD;  Location: [facility] INVASIVE CV LAB;  Service: Cardiovascular;  Laterality: N/A;   TONSILLECTOMY       Social History:   reports that he quit smoking about 30 years ago. His smoking use included cigarettes. He  started smoking about 55 years ago. He has a 25 pack-year smoking history. He has never been exposed to tobacco smoke. He has never used smokeless tobacco. He reports current alcohol use. He reports that he does not currently use drugs.   Family History:  His family history includes Asthma in his mother; Breast cancer in his mother; Colon polyps in his mother; Diabetes in his mother; Heart attack in his maternal grandfather; Heart failure in his brother; Hypertension in his father and maternal grandfather; Ovarian cancer in his maternal grandmother; Stroke in his brother and father. There is no history of Colon cancer.   Allergies Allergies[1]   Home Medications  Prior to Admission medications  Medication Sig Start Date End Date Taking? Authorizing Provider  ADEMPAS  2 MG TABS TAKE 1 TABLET THREE TIMES A DAY (IN THE MORNING, AT NOON, AND AT BEDTIME) 05/29/24  Yes Rolan Ezra RAMAN, MD  amLODipine  (NORVASC ) 10 MG tablet TAKE 1 TABLET BY MOUTH DAILY 06/12/24  Yes Jordan, [name], MD  carvedilol  (COREG ) 12.5 MG tablet TAKE 1 TABLET (12.5MG  TOTAL) BY MOUTH TWICE A DAY WITH MEALS 08/05/24   Rolan Ezra RAMAN, MD  carvedilol  (COREG ) 6.25 MG tablet Take 6.25 mg by mouth 2 (two) times daily.    [provider]  cholecalciferol  (VITAMIN D3) 25 MCG  (1000 UNIT) tablet Take 1,000 Units by mouth daily.    [provider]  DULoxetine  (CYMBALTA ) 30 MG capsule TAKE 1 CAPSULE BY MOUTH DAILY 06/02/24   Jordan, [name], MD  ELIQUIS  5 MG TABS tablet TAKE 1 TABLET BY MOUTH TWICE A DAY 07/06/24   Rolan Ezra RAMAN, MD  ferrous sulfate  325 (65 FE) MG tablet TAKE 1 TABLET BY MOUTH EVERY DAY WITH BREAKFAST 03/01/23   Jordan, [name], MD  gabapentin  (NEURONTIN ) 800 MG tablet Take 1 tablet (800 mg total) by mouth 4 (four) times daily for neuropathy. 05/26/24     gabapentin  (NEURONTIN ) 800 MG tablet Take 1 tablet (800 mg total) by mouth 4 (four) times daily as needed for neuropathy. 06/23/24     gabapentin  (NEURONTIN ) 800 MG tablet Take 1 tablet (800 mg total) by mouth 4 (four) times daily for neuropathy. 08/21/24     HYDROcodone -acetaminophen  (NORCO) 10-325 MG tablet Take 1 tablet by mouth 4 (four) times daily as needed for pain 04/22/24     HYDROcodone -acetaminophen  (NORCO) 10-325 MG tablet Take 1 tablet by mouth 6 (six) times daily as needed for pain. 08/21/24     latanoprost  (XALATAN ) 0.005 % ophthalmic solution Place 1 drop into both eyes every evening. 03/23/24     levETIRAcetam  (KEPPRA ) 250 MG tablet TAKE 1 TABLET BY MOUTH TWICE  DAILY 12/16/23   Jordan, [name], MD  lidocaine  (LIDODERM ) 5 % Place 1 patch onto the skin daily as needed (pain). Remove & Discard patch within 12 hours or as directed by MD    [provider]  naloxone  (NARCAN ) nasal spray 4 mg/0.1 mL Place 1 spray into the nose once as needed 11/05/23     naloxone  (NARCAN ) nasal spray 4 mg/0.1 mL Place 1 spray into the nose as needed. 05/26/24     omeprazole  (PRILOSEC) 40 MG capsule TAKE 1 CAPSULE BY MOUTH DAILY 11/01/23   Jordan, [name], MD  OPSUMIT  10 MG tablet Take 1 tablet daily. 04/08/24   Rolan Ezra RAMAN, MD  polyethylene glycol (MIRALAX  / GLYCOLAX ) 17 g packet Take 17 g by mouth daily as needed for mild constipation. 09/06/21   Maurice Males  S, PA-C  potassium chloride  SA  (KLOR-CON  M) 20 MEQ tablet Take 1 tablet (20 mEq total) by mouth daily. 07/20/24   Milford, Harlene HERO, FNP  prednisoLONE  acetate (PRED FORTE ) 1 % ophthalmic suspension Place 1 drop into the left eye in the morning, at noon, and at bedtime.    [provider]  rOPINIRole  (REQUIP ) 1 MG tablet TAKE 1 TABLET BY MOUTH AT  BEDTIME 06/12/24   Jordan, [name], MD  rosuvastatin  (CRESTOR ) 20 MG tablet TAKE 1 TABLET BY MOUTH DAILY 05/28/24   Jordan, [name], MD  senna-docusate (SENOKOT-S) 8.6-50 MG tablet Take 2 tablets by mouth at bedtime. 09/21/23   [name] Leatrice FERNS, MD  spironolactone  (ALDACTONE ) 25 MG tablet TAKE 1 TABLET BY MOUTH DAILY 08/19/24   Rolan Ezra RAMAN, MD  torsemide  (DEMADEX ) 20 MG tablet Take 3 tablets (60 mg total) by mouth 2 (two) times daily for 3 days, THEN 4 tablets (80 mg total) daily thereafter. 07/20/24 08/19/24  Glena Harlene HERO, FNP  traZODone  (DESYREL ) 50 MG tablet TAKE 1/2 TO 1 TABLET BY MOUTH AT BEDTIME AS NEEDED FOR SLEEP 07/31/23   Lovorn, Megan, MD  vitamin C  (ASCORBIC ACID ) 250 MG tablet Take 250 mg by mouth daily.    [provider]      Harden Staff MD. FCCP. [facility] Pager : [phone]  If no response to pager , please call [phone] until 7 pm After 7:00 pm call Elink  [phone]   09/18/2024            [1]  Allergies Allergen Reactions   Aspirin  Other (See Comments)    Irritates the stomach and the patient developed ulcers, also   Lisinopril  Other (See Comments)    Caused a body ache

## 2024-09-18 NOTE — ED Notes (Signed)
 Male Purewick placed on pt

## 2024-09-18 NOTE — H&P (Addendum)
 History and Physical    Patient: Keith [name] FMW:[id number] DOB: 1950-10-30 DOA: 09/18/2024 DOS: the patient was seen and examined on 09/18/2024 . PCP: Jordan, [name], MD  Patient coming from: Home Chief complaint: Chief Complaint  Patient presents with   Shortness of Breath   Fall   HPI:  Keith [name] is a 73 y.o. male with past medical history  of  PMHX of recurrent VTE (saddle PE/LLE DVT 06/2020 [presented with syncope, s/p TPA], PE 02/2022 [facility]]), and subsequently diagnosed with CTEPH s/p BPAs 06/11/2024 and 07/10/2024, HFpEF, HTN, T2DM, obesity, OSA on CPAP, hx tobacco use, left adrenal adenoma, spinal stenosis (multiple orthopedic/back surgeries; not able to stand), coming for shortness of breath and weakness and fall at home, while he was trying to change position.  Patient states that today very weak and short of breath that his wife called EMS at that time noted that his O2 sat was in the 70s and 80s and had to be started on 6 L of nasal cannula.  ED Course:  Vital signs in the ED were notable for the following:  Vitals:   09/18/24 1530 09/18/24 1802 09/18/24 1816 09/18/24 1845  BP: 113/72 102/73  109/74  Pulse: 93 89  87  Temp:   98.1 F (36.7 C)   Resp: 15 15  14   Height:      Weight:      SpO2: 99% 95%  95%  TempSrc:   Oral   BMI (Calculated):       >>ED evaluation thus far shows:  Venous blood gas showing pH of 7.4 pCO2 of 37.9 and pO2 of 62. CMP showing glucose 158, AKI with a creatinine of 1.33 eGFR of 57 normal LFTs. BNP of 46.8 and troponin of 20 repeat troponin at 28. CBC showing anemia with a hemoglobin of 11.1 white count 9.0 platelets 279. Chest x-ray today shows cardiomegaly. EKG shows sinus rhythm with 95 prolonged PR at 224, QRS of 98 QTc of 461, low voltage in lateral leads.   >>While in the ED patient received the following: Medications  furosemide  (LASIX ) injection 60 mg (60 mg Intravenous Given 09/18/24 1557)  iohexol  (OMNIPAQUE )  350 MG/ML injection 75 mL (75 mLs Intravenous Contrast Given 09/18/24 1654)   Review of Systems  Respiratory:  Positive for shortness of breath.   Musculoskeletal:  Positive for falls.  Neurological:  Positive for weakness.   Past Medical History:  Diagnosis Date   Allergy    CHF (congestive heart failure) (HCC)    Chronic back pain    Diabetes mellitus without complication (HCC)    DNR (do not resuscitate) 11/19/2020   Duodenal ulcer hemorrhage 08/29/2014   ED (erectile dysfunction)    Esophageal stricture 08/30/2014   Glucose intolerance (impaired glucose tolerance)    Hiatal hernia 08/30/2014   Hypertension    Hypokalemia 04/11/2013   MRSA carrier 08/30/2014   Obesity    Osteoarthritis    Pneumonia    Pulmonary embolism (HCC) 06/2020   Scoliosis 2016   Spinal stenosis of lumbar region    Urinary tract infection 04/11/2013   Past Surgical History:  Procedure Laterality Date   BACK SURGERY  08/07/2021   x 2   BIOPSY  09/20/2023   Procedure: BIOPSY;  Surgeon: Legrand Victory LITTIE DOUGLAS, MD;  Location: [facility] ENDOSCOPY;  Service: Gastroenterology;;   BIOPSY  09/21/2023   Procedure: BIOPSY;  Surgeon: San Sandor GAILS, DO;  Location: [facility] ENDOSCOPY;  Service: Gastroenterology;;  rectal  COLONOSCOPY  2008   COLONOSCOPY WITH PROPOFOL  N/A 09/21/2023   Procedure: COLONOSCOPY WITH PROPOFOL ;  Surgeon: San Sandor GAILS, DO;  Location: [facility] ENDOSCOPY;  Service: Gastroenterology;  Laterality: N/A;   ESOPHAGOGASTRODUODENOSCOPY N/A 08/29/2014   Procedure: ESOPHAGOGASTRODUODENOSCOPY (EGD);  Surgeon: Princella CHRISTELLA Nida, MD;  Location: [facility];  Service: Endoscopy;  Laterality: N/A;   ESOPHAGOGASTRODUODENOSCOPY (EGD) WITH PROPOFOL  N/A 09/20/2023   Procedure: ESOPHAGOGASTRODUODENOSCOPY (EGD) WITH PROPOFOL ;  Surgeon: Legrand Victory LITTIE DOUGLAS, MD;  Location: [facility] ENDOSCOPY;  Service: Gastroenterology;  Laterality: N/A;   LAMINECTOMY N/A 07/28/2018   Procedure: THORACIC ELEVEN- THORACIC TWELVE POSTERIOR  DECOMPRESSION LAMINECTOMY;  Surgeon: Cheryle Debby LABOR, MD;  Location: [facility] OR;  Service: Neurosurgery;  Laterality: N/A;   LUMBAR LAMINECTOMY     NASAL POLYP SURGERY     ORIF ANKLE FRACTURE Right 07/01/2020   Procedure: OPEN REDUCTION INTERNAL FIXATION (ORIF) ANKLE FRACTURE. TRIMALLEOLAR;  Surgeon: Josefina Chew, MD;  Location: [facility] OR;  Service: Orthopedics;  Laterality: Right;   POLYPECTOMY  09/21/2023   Procedure: POLYPECTOMY;  Surgeon: San Sandor GAILS, DO;  Location: [facility] ENDOSCOPY;  Service: Gastroenterology;;  cecal   RIGHT HEART CATH N/A 07/06/2022   Procedure: RIGHT HEART CATH;  Surgeon: Rolan Ezra RAMAN, MD;  Location: [facility] INVASIVE CV LAB;  Service: Cardiovascular;  Laterality: N/A;   RIGHT/LEFT HEART CATH AND CORONARY ANGIOGRAPHY N/A 03/02/2022   Procedure: RIGHT/LEFT HEART CATH AND CORONARY ANGIOGRAPHY;  Surgeon: Claudene Pacific, MD;  Location: [facility] INVASIVE CV LAB;  Service: Cardiovascular;  Laterality: N/A;   TONSILLECTOMY      reports that he quit smoking about 30 years ago. His smoking use included cigarettes. He started smoking about 55 years ago. He has a 25 pack-year smoking history. He has never been exposed to tobacco smoke. He has never used smokeless tobacco. He reports current alcohol use. He reports that he does not currently use drugs. Allergies[1] Family History  Problem Relation Age of Onset   Diabetes Mother    Asthma Mother    Breast cancer Mother    Colon polyps Mother    Hypertension Father    Stroke Father    Heart failure Brother    Stroke Brother    Ovarian cancer Maternal Grandmother    Hypertension Maternal Grandfather    Heart attack Maternal Grandfather    Colon cancer Neg Hx    Prior to Admission medications  Medication Sig Start Date End Date Taking? Authorizing Provider  ADEMPAS  2 MG TABS TAKE 1 TABLET THREE TIMES A DAY (IN THE MORNING, AT NOON, AND AT BEDTIME) 05/29/24  Yes Rolan Ezra RAMAN, MD  amLODipine  (NORVASC ) 10 MG tablet TAKE 1 TABLET BY  MOUTH DAILY 06/12/24  Yes Jordan, [name], MD  carvedilol  (COREG ) 12.5 MG tablet TAKE 1 TABLET (12.5MG  TOTAL) BY MOUTH TWICE A DAY WITH MEALS 08/05/24  Yes Rolan Ezra RAMAN, MD  cholecalciferol  (VITAMIN D3) 25 MCG (1000 UNIT) tablet Take 1,000 Units by mouth daily.   Yes [provider]  DULoxetine  (CYMBALTA ) 30 MG capsule TAKE 1 CAPSULE BY MOUTH DAILY 06/02/24  Yes Jordan, [name], MD  ELIQUIS  5 MG TABS tablet TAKE 1 TABLET BY MOUTH TWICE A DAY 07/06/24  Yes Rolan Ezra RAMAN, MD  ferrous sulfate  325 (65 FE) MG tablet TAKE 1 TABLET BY MOUTH EVERY DAY WITH BREAKFAST 03/01/23  Yes Jordan, [name], MD  gabapentin  (NEURONTIN ) 800 MG tablet Take 1 tablet (800 mg total) by mouth 4 (four) times daily for neuropathy. 08/21/24  Yes  HYDROcodone -acetaminophen  (NORCO) 10-325 MG tablet Take 1 tablet by mouth 6 (six) times daily as needed for pain. 08/21/24  Yes   levETIRAcetam  (KEPPRA ) 250 MG tablet TAKE 1 TABLET BY MOUTH TWICE  DAILY 12/16/23  Yes Jordan, [name], MD  lidocaine  (LIDODERM ) 5 % Place 1 patch onto the skin daily as needed (pain). Remove & Discard patch within 12 hours or as directed by MD   Yes [provider]  naloxone  (NARCAN ) nasal spray 4 mg/0.1 mL Place 1 spray into the nose as needed. 05/26/24  Yes   omeprazole  (PRILOSEC) 40 MG capsule TAKE 1 CAPSULE BY MOUTH DAILY Patient taking differently: Take 40 mg by mouth daily as needed. 11/01/23  Yes Jordan, [name], MD  OPSUMIT  10 MG tablet Take 1 tablet daily. 04/08/24  Yes Rolan Ezra RAMAN, MD  polyethylene glycol (MIRALAX  / GLYCOLAX ) 17 g packet Take 17 g by mouth daily as needed for mild constipation. 09/06/21  Yes Love, Sharlet RAMAN, PA-C  potassium chloride  SA (KLOR-CON  M) 20 MEQ tablet Take 1 tablet (20 mEq total) by mouth daily. Patient taking differently: Take 20 mEq by mouth daily as needed. 07/20/24  Yes Milford, Harlene HERO, FNP  rOPINIRole  (REQUIP ) 1 MG tablet TAKE 1 TABLET BY MOUTH AT  BEDTIME 06/12/24  Yes Jordan, [name], MD   rosuvastatin  (CRESTOR ) 20 MG tablet TAKE 1 TABLET BY MOUTH DAILY 05/28/24  Yes Jordan, [name], MD  senna-docusate (SENOKOT-S) 8.6-50 MG tablet Take 2 tablets by mouth at bedtime. Patient taking differently: Take 2 tablets by mouth at bedtime as needed. 09/21/23  Yes [name] Eland I, MD  spironolactone  (ALDACTONE ) 25 MG tablet TAKE 1 TABLET BY MOUTH DAILY 08/19/24  Yes McLean, Dalton S, MD  torsemide  (DEMADEX ) 20 MG tablet Take 3 tablets (60 mg total) by mouth 2 (two) times daily for 3 days, THEN 4 tablets (80 mg total) daily thereafter. Patient taking differently: Take 1 tablet by mouth daily  07/20/24 09/18/24 Yes Milford, Harlene HERO, FNP  traZODone  (DESYREL ) 50 MG tablet TAKE 1/2 TO 1 TABLET BY MOUTH AT BEDTIME AS NEEDED FOR SLEEP 07/31/23  Yes Lovorn, Megan, MD  vitamin C  (ASCORBIC ACID ) 250 MG tablet Take 250 mg by mouth daily.   Yes [provider]  gabapentin  (NEURONTIN ) 800 MG tablet Take 1 tablet (800 mg total) by mouth 4 (four) times daily for neuropathy. Patient not taking: Reported on 09/18/2024 05/26/24     gabapentin  (NEURONTIN ) 800 MG tablet Take 1 tablet (800 mg total) by mouth 4 (four) times daily as needed for neuropathy. Patient not taking: Reported on 09/18/2024 06/23/24     HYDROcodone -acetaminophen  (NORCO) 10-325 MG tablet Take 1 tablet by mouth 4 (four) times daily as needed for pain Patient not taking: Reported on 09/18/2024 04/22/24     latanoprost  (XALATAN ) 0.005 % ophthalmic solution Place 1 drop into both eyes every evening. Patient not taking: Reported on 09/18/2024 03/23/24     naloxone  (NARCAN ) nasal spray 4 mg/0.1 mL Place 1 spray into the nose once as needed Patient not taking: Reported on 09/18/2024 11/05/23  Vitals:   09/18/24 1530 09/18/24 1802 09/18/24 1816 09/18/24 1845  BP: 113/72 102/73  109/74  Pulse: 93 89  87  Resp: 15 15  14   Temp:   98.1 F (36.7 C)    TempSrc:   Oral   SpO2: 99% 95%  95%  Weight:      Height:       Physical Exam Vitals reviewed.  Constitutional:      General: He is not in acute distress.    Appearance: He is obese. He is not ill-appearing.     Interventions: Nasal cannula in place.  HENT:     Head: Normocephalic and atraumatic.  Eyes:     Extraocular Movements: Extraocular movements intact.  Cardiovascular:     Rate and Rhythm: Normal rate and regular rhythm.     Pulses: Normal pulses.     Heart sounds: Normal heart sounds.  Pulmonary:     Breath sounds: Wheezing present.  Abdominal:     General: There is no distension.     Palpations: Abdomen is soft.     Tenderness: There is no abdominal tenderness.  Musculoskeletal:     Right lower leg: Edema present.     Left lower leg: Edema present.  Skin:    General: Skin is warm.  Neurological:     General: No focal deficit present.     Mental Status: He is alert and oriented to person, place, and time.     Labs on Admission: I have personally reviewed following labs and imaging studies CBC: Recent Labs  Lab 09/18/24 1314 09/18/24 1325  WBC 9.0  --   NEUTROABS 7.4  --   HGB 11.1* 11.9*  HCT 37.1* 35.0*  MCV 90.0  --   PLT 279  --    Basic Metabolic Panel: Recent Labs  Lab 09/18/24 1314 09/18/24 1325  NA 141 144  K 4.0 4.0  CL 105  --   CO2 25  --   GLUCOSE 158*  --   BUN 21  --   CREATININE 1.33*  --   CALCIUM  9.2  --    GFR: Estimated Creatinine Clearance: 66.5 mL/min (A) (by C-G formula based on SCr of 1.33 mg/dL (H)). Liver Function Tests: Recent Labs  Lab 09/18/24 1314  AST 26  ALT 18  ALKPHOS 82  BILITOT 0.5  PROT 7.4  ALBUMIN  3.6   No results for input(s): LIPASE, AMYLASE in the last 168 hours. No results for input(s): AMMONIA in the last 168 hours. Recent Labs    09/20/23 0743 09/21/23 1032 09/23/23 0620 09/24/23 0556 10/22/23 1241 12/30/23 1005 05/14/24 1019 07/21/24 1413 09/18/24 1314  BUN 19 12 16 19   27* 19 18 19 21   CREATININE 1.24 0.96 1.27* 1.06 0.99 1.20 1.02 1.32* 1.33*    Cardiac Enzymes: No results for input(s): CKTOTAL, CKMB, CKMBINDEX, TROPONINI in the last 168 hours. BNP (last 3 results) No results for input(s): PROBNP in the last 8760 hours. HbA1C: No results for input(s): HGBA1C in the last 72 hours. CBG: No results for input(s): GLUCAP in the last 168 hours. Lipid Profile: No results for input(s): CHOL, HDL, LDLCALC, TRIG, CHOLHDL, LDLDIRECT in the last 72 hours. Thyroid  Function Tests: No results for input(s): TSH, T4TOTAL, FREET4, T3FREE, THYROIDAB in the last 72 hours. Anemia Panel: No results for input(s): VITAMINB12, FOLATE, FERRITIN, TIBC, IRON, RETICCTPCT in the last 72 hours. Urine analysis:    Component Value Date/Time   COLORURINE YELLOW 03/27/2022 2016   APPEARANCEUR  CLOUDY (A) 03/27/2022 2016   LABSPEC 1.020 03/27/2022 2016   PHURINE 8.0 03/27/2022 2016   GLUCOSEU NEGATIVE 03/27/2022 2016   HGBUR NEGATIVE 03/27/2022 2016   HGBUR negative 10/26/2010 0824   BILIRUBINUR NEGATIVE 03/27/2022 2016   BILIRUBINUR n 01/12/2019 1020   KETONESUR NEGATIVE 03/27/2022 2016   PROTEINUR NEGATIVE 03/27/2022 2016   UROBILINOGEN 0.2 01/12/2019 1020   UROBILINOGEN 0.2 04/10/2013 1555   NITRITE POSITIVE (A) 03/27/2022 2016   LEUKOCYTESUR NEGATIVE 03/27/2022 2016   Radiological Exams on Admission: CT Angio Chest PE W and/or Wo Contrast Result Date: 09/18/2024 EXAM: CTA of the Chest with contrast for PE 09/18/2024 04:53:28 PM TECHNIQUE: CTA of the chest was performed after the administration of intravenous contrast. Multiplanar reformatted images are provided for review. MIP images are provided for review. Automated exposure control, iterative reconstruction, and/or weight based adjustment of the mA/kV was utilized to reduce the radiation dose to as low as reasonably achievable. Evaluation is limited secondary to motion  artifact. COMPARISON: CT chest 08/14/2023. CLINICAL HISTORY: Pulmonary embolism (PE) suspected, high prob. FINDINGS: PULMONARY ARTERIES: Pulmonary arteries are adequately opacified for evaluation. There is no evidence for pulmonary embolism to the segmental level. Evaluation is limited secondary to motion artifact. Main pulmonary artery is normal in caliber. MEDIASTINUM: The heart is enlarged, unchanged. There is no acute abnormality of the thoracic aorta. LYMPH NODES: Stable mild bilateral hilar lymphadenopathy with lymphadenopathy measuring up to 1 cm. No mediastinal or axillary lymphadenopathy. LUNGS AND PLEURA: Patchy ground glass opacities in the right lower lobe and bilateral upper lobes. There is atelectasis in the left lower lobe. There is a trace left pleural effusion. No pneumothorax. UPPER ABDOMEN: There is a small hiatal hernia. Left adrenal adenoma is grossly unchanged. SOFT TISSUES AND BONES: Lower Thoracic Spinal Fusion Hardware is present. Degenerative changes affect the spine. No acute soft tissue abnormality. IMPRESSION: 1. No evidence of pulmonary embolism to the segmental level; evaluation limited by motion artifact. 2. Patchy ground glass opacities in the right lower lobe and bilateral upper lobes. Findings may be infectious/inflammatory. 3. Atelectasis in the left lower lobe. 4. Cardiomegaly, unchanged. 5. Stable mild bilateral hilar lymphadenopathy measuring up to 1 cm. 6. Trace left pleural effusion. 7. Small hiatal hernia. Electronically signed by: Greig Pique MD 09/18/2024 05:59 PM EST RP Workstation: [id number]   DG Chest Port 1 View Result Date: 09/18/2024 EXAM: 1 VIEW(S) XRAY OF THE CHEST 09/18/2024 12:54:30 PM COMPARISON: 09/18/2023 CLINICAL HISTORY: sob FINDINGS: LUNGS AND PLEURA: No focal consolidation. No pleural effusion. No pneumothorax. HEART AND MEDIASTINUM: Cardiomegaly. BONES AND SOFT TISSUES: Scoliosis. Partial visualization of lumbar surgical hardware. IMPRESSION: 1.  Cardiomegaly. Electronically signed by: Dorethia Molt MD 09/18/2024 01:30 PM EST RP Workstation: [id number]   Data Reviewed: Relevant notes from primary care and specialist visits, past discharge summaries as available in EHR, including Care Everywhere . Prior diagnostic testing as pertinent to current admission diagnoses, Updated medications and problem lists for reconciliation .ED course, including vitals, labs, imaging, treatment and response to treatment,Triage notes, nursing and pharmacy notes and ED provider's notes.Notable results as noted in HPI.Discussed case with EDMD/ ED APP/ or Specialty MD on call and as needed.  Assessment & Plan    >> Shortness of breath/acute respiratory failure with hypoxia: 2/2 to PE / pulmonary HTN/bronchitis, pneumonia, cont with supplemental oxygen .  Will continue patient on IV antibiotics. As needed diuresis patient received Lasix  in the emergency room.   >>Chronic PE/chronic severe pulmonary hypertension: Cont eliqus and repeat CTA is reassuring.  Appreciate pulmonary consult and management. Pt to keep his BPA appt at [facility].  Patient unfortunately has had severe VTE with left leg DVT and saddle PE in 2021.  Currently is being [facility] by [facility] cardiology   >> Morbid obesity/falls: Nutritionist consult for diet and consider GLP-1 upon discharge or per PCP once patient is stable.   >> Diabetes mellitus type 2: Glycemic protocol and carb consistent diet.  >> Acute on chronic anemia:    Latest Ref Rng & Units 09/18/2024    1:25 PM 09/18/2024    1:14 PM 12/30/2023   10:05 AM  CBC  WBC 4.0 - 10.5 K/uL  9.0  5.1   Hemoglobin 13.0 - 17.0 g/dL 88.0  88.8  87.0   Hematocrit 39.0 - 52.0 % 35.0  37.1  39.6   Platelets 150 - 400 K/uL  279  235   Currently hemoglobin is stable and will follow.  Refrain from NSAIDs Toradol  ketorolac  ibuprofen or Aleve for any pain control.   >> History of GI bleed: IV PPI, aspiration precaution.   >> CKD stage  IIIa: Lab Results  Component Value Date   CREATININE 1.33 (H) 09/18/2024   CREATININE 1.32 (H) 07/21/2024   CREATININE 1.02 05/14/2024  Mild AKI, avoid contrast unless clear with GFR and or absolutely needed.    >> Essential hypertension: Vitals:   09/18/24 1230 09/18/24 1300 09/18/24 1330 09/18/24 1430  BP: 117/62 (!) 113/45 108/76 113/67   09/18/24 1530 09/18/24 1802 09/18/24 1845  BP: 113/72 102/73 109/74  Will continue patient on Coreg , continue Aldactone .  >>OSA: Cont CPAP.   DVT prophylaxis:  Eliquis .  Consults:  Pulmonology.   Advance Care Planning:    Code Status: Full Code   Family Communication:  None Disposition Plan:  Home Severity of Illness: The appropriate patient status for this patient is INPATIENT. Inpatient status is judged to be reasonable and necessary in order to provide the required intensity of service to ensure the patient's safety. The patient's presenting symptoms, physical exam findings, and initial radiographic and laboratory data in the context of their chronic comorbidities is felt to place them at high risk for further clinical deterioration. Furthermore, it is not anticipated that the patient will be medically stable for discharge from the hospital within 2 midnights of admission.   * I certify that at the point of admission it is my clinical judgment that the patient will require inpatient hospital care spanning beyond 2 midnights from the point of admission due to high intensity of service, high risk for further deterioration and high frequency of surveillance required.*  Unresulted Labs (From admission, onward)     Start     Ordered   09/19/24 0500  Comprehensive metabolic panel  Tomorrow morning,   R        09/18/24 1806   09/19/24 0500  CBC  Tomorrow morning,   R        09/18/24 1806   09/18/24 1900  HIV Antibody (routine testing w rflx)  Once,   R        09/18/24 1900            Meds ordered this encounter  Medications    furosemide  (LASIX ) injection 60 mg   iohexol  (OMNIPAQUE ) 350 MG/ML injection 75 mL   cefTRIAXone  (ROCEPHIN ) 2 g in sodium chloride  0.9 % 100 mL IVPB    Antibiotic Indication::   CAP   DISCONTD: sodium chloride  flush (NS) 0.9 % injection 3 mL  DISCONTD: sodium chloride  flush (NS) 0.9 % injection 3 mL   0.9 %  sodium chloride  infusion   azithromycin  (ZITHROMAX ) 500 mg in sodium chloride  0.9 % 250 mL IVPB    Antibiotic Indication::   CAP   apixaban  (ELIQUIS ) tablet 5 mg   DULoxetine  (CYMBALTA ) DR capsule 30 mg   carvedilol  (COREG ) tablet 12.5 mg   levETIRAcetam  (KEPPRA ) tablet 250 mg   rOPINIRole  (REQUIP ) tablet 1 mg   rosuvastatin  (CRESTOR ) tablet 20 mg   spironolactone  (ALDACTONE ) tablet 25 mg   gabapentin  (NEURONTIN ) capsule 800 mg   Riociguat  TABS 2 mg    Spouse will bring med unless we have it.   macitentan  (OPSUMIT ) tablet 10 mg   furosemide  (LASIX ) injection 60 mg   Orders Placed This Encounter  Procedures   Resp panel by RT-PCR (RSV, Flu A&B, Covid) Anterior Nasal Swab   DG Chest Port 1 View   CT Angio Chest PE W and/or Wo Contrast   CBC with Differential/Platelet   Brain natriuretic peptide   Comprehensive metabolic panel with GFR   Procalcitonin   Strep pneumoniae urinary antigen   Comprehensive metabolic panel   CBC   HIV Antibody (routine testing w rflx)   Diet Heart Room service appropriate? Yes; Fluid consistency: Thin; Fluid restriction: 1200 mL Fluid   Vital signs   Notify physician (specify) Before calling MD verify blood pressure manually.   Refer to Sidebar Report Mobility Protocol for Adult Inpatient   Complete Pneumococcal/Influenza Immunization assessment   Intake and Output   Check Pulse Oximetry while ambulating   Apply Pneumonia Care Plan   Elevate head of bed   If diabetic or glucose greater than 140 notify MD to place Glycemic Control Order Set   Maintain IV access   Initiate Oral Care Protocol   Initiate Carrier Fluid Protocol   Turn, cough,  deep breathe, Incentive spirometry every 2 hours while awake.   Nurse to provide smoking / tobacco cessation education   RN may order General Admission PRN Orders utilizing General Admission PRN medications (through manage orders) for the following patient needs: allergy symptoms (Claritin ), cold sores (Carmex), cough (Robitussin DM), eye irritation (Liquifilm Tears), hemorrhoids (Tucks), indigestion (Maalox), minor skin irritation (Hydrocortisone  Cream), muscle pain Lucienne Gay), nose irritation (saline nasal spray) and sore throat (Chloraseptic spray).   Bed rest   Cardiac Monitoring - Continuous Indefinite   Full code   Consult to hospitalist   Consult to pulmonology   Monitoring by Pharmacy   Oxygen  therapy Mode or (Route): Nasal cannula; Liters Per Minute: 2; Keep O2 saturation between: greater than 92 %   CPAP   I-Stat venous blood gas, ED   EKG 12-Lead   Admit to Inpatient (patient's expected length of stay will be greater than 2 midnights or inpatient only procedure)   Aspiration precautions   Fall precautions    Author: Mario LULLA Blanch, MD 12 pm- 8 pm. [facility]. 09/18/2024 8:04 PM Please note for any communication after hours contact [facility] Assigned provider on call on Amion.       [1]  Allergies Allergen Reactions   Aspirin  Other (See Comments)    Irritates the stomach and the patient developed ulcers, also   Lisinopril  Other (See Comments)    Caused a body ache

## 2024-09-19 ENCOUNTER — Encounter (HOSPITAL_COMMUNITY): Payer: Self-pay | Admitting: Internal Medicine

## 2024-09-19 DIAGNOSIS — J9601 Acute respiratory failure with hypoxia: Secondary | ICD-10-CM | POA: Diagnosis not present

## 2024-09-19 LAB — CBC
HCT: 32.1 % — ABNORMAL LOW (ref 39.0–52.0)
Hemoglobin: 9.7 g/dL — ABNORMAL LOW (ref 13.0–17.0)
MCH: 26.9 pg (ref 26.0–34.0)
MCHC: 30.2 g/dL (ref 30.0–36.0)
MCV: 89.2 fL (ref 80.0–100.0)
Platelets: 256 K/uL (ref 150–400)
RBC: 3.6 MIL/uL — ABNORMAL LOW (ref 4.22–5.81)
RDW: 17.2 % — ABNORMAL HIGH (ref 11.5–15.5)
WBC: 4.5 K/uL (ref 4.0–10.5)
nRBC: 0 % (ref 0.0–0.2)

## 2024-09-19 LAB — COMPREHENSIVE METABOLIC PANEL WITH GFR
ALT: 17 U/L (ref 0–44)
AST: 22 U/L (ref 15–41)
Albumin: 3.2 g/dL — ABNORMAL LOW (ref 3.5–5.0)
Alkaline Phosphatase: 76 U/L (ref 38–126)
Anion gap: 6 (ref 5–15)
BUN: 15 mg/dL (ref 8–23)
CO2: 30 mmol/L (ref 22–32)
Calcium: 9.3 mg/dL (ref 8.9–10.3)
Chloride: 106 mmol/L (ref 98–111)
Creatinine, Ser: 1.21 mg/dL (ref 0.61–1.24)
GFR, Estimated: >60 mL/min (ref >60)
Glucose, Bld: 117 mg/dL — ABNORMAL HIGH (ref 70–99)
Potassium: 3.5 mmol/L (ref 3.5–5.1)
Sodium: 142 mmol/L (ref 135–145)
Total Bilirubin: 0.6 mg/dL (ref 0.0–1.2)
Total Protein: 6.8 g/dL (ref 6.5–8.1)

## 2024-09-19 MED ORDER — LIDOCAINE 5 % EX PTCH
1.0000 | MEDICATED_PATCH | Freq: Every day | CUTANEOUS | Status: DC
  Administered 2024-09-19 – 2024-09-21 (×3): 1 via TRANSDERMAL
  Filled 2024-09-19 (×3): qty 1

## 2024-09-19 MED ORDER — RIOCIGUAT 2 MG PO TABS
2.0000 mg | ORAL_TABLET | Freq: Three times a day (TID) | ORAL | Status: DC
  Administered 2024-09-19 – 2024-09-21 (×6): 2 mg via ORAL
  Filled 2024-09-19: qty 2
  Filled 2024-09-19: qty 1
  Filled 2024-09-19: qty 2
  Filled 2024-09-19: qty 1
  Filled 2024-09-19 (×4): qty 2
  Filled 2024-09-19 (×2): qty 1

## 2024-09-19 MED ORDER — HYDROCODONE-ACETAMINOPHEN 10-325 MG PO TABS: 1.0000 | ORAL_TABLET | ORAL | Status: DC | PRN

## 2024-09-19 MED ORDER — ACETAMINOPHEN 325 MG PO TABS
650.0000 mg | ORAL_TABLET | Freq: Four times a day (QID) | ORAL | Status: DC | PRN
  Administered 2024-09-19: 650 mg via ORAL
  Filled 2024-09-19: qty 2

## 2024-09-19 MED ORDER — PANTOPRAZOLE SODIUM 40 MG PO TBEC
40.0000 mg | DELAYED_RELEASE_TABLET | Freq: Every day | ORAL | Status: DC
  Administered 2024-09-19 – 2024-09-21 (×3): 40 mg via ORAL
  Filled 2024-09-19 (×3): qty 1

## 2024-09-19 MED ORDER — HYDROCODONE-ACETAMINOPHEN 10-325 MG PO TABS
1.0000 | ORAL_TABLET | ORAL | Status: DC | PRN
  Administered 2024-09-19 – 2024-09-21 (×7): 1 via ORAL
  Filled 2024-09-19 (×7): qty 1

## 2024-09-19 NOTE — ED Notes (Signed)
 Patient cleaned, bed linens and gown changed. Patient provided with new blankets. Patient denies any complaints at this time. Bed locked and in lowest position with rails raised.

## 2024-09-19 NOTE — Progress Notes (Addendum)
 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  Patient and wife had developed URI symptoms 5 days ago, started getting take away from fast food places, and noted progressive increase in lower extremity edema as well as progressive DOE.  Patient notes he has been taking his medications as usual and has not skipped his torsemide  or anticoagulation.  On day of admission he felt so weak that he was unable to transfer from his wheelchair.  Patient denied fevers or chills or malaise, no new cough.  In ED, patient noted to have O2 sats in the low 80s, chest x-ray unremarkable, CTA negative for PE but did show patchy ground glass opacities in BUL and RLL, patient was treated with Lasix  with marked improvement in symptoms and decreased oxygen  requirement.  Patient was admitted for management of decompensated heart failure likely secondary to dietary indiscretion and possible CAP.  Subjective:  Patient states that he still feels somewhat weak but much better than when he came in.  His main concern is that he still in the emergency room and he does not like the food there and is worried that his lower back pain is going to get much worse with the thin mattress he is on.  Notes he does not wear oxygen  at home   Objective: Vitals:   09/19/24 0800 09/19/24 0840 09/19/24 0850 09/19/24 1132  BP: 121/72 (!) 125/55  (!) 112/58  Pulse: 84 94  77  Resp: 15   16  Temp:   98.4 F (36.9 C) 99 F (37.2 C)  TempSrc:   Oral Oral  SpO2: 94%   96%  Weight:      Height:        Intake/Output Summary (Last 24  hours) at 09/19/2024 1228 Last data filed at 09/19/2024 0500 Gross per 24 hour  Intake 350 ml  Output 1525 ml  Net -1175 ml   Filed Weights   09/18/24 1302  Weight: 117.9 kg     Exam:  General: Patient lying in gurney in no acute respiratory distress, able to speak in full sentences without difficulty Eyes: sclera anicteric, conjuctiva mild injection bilaterally CVS: S1-S2, regular  Respiratory: Decreased air entry bilaterally with few rales at right base GI: NABS, soft, NT  LE: 2-3+ edema, compression hose in place Neuro: A/O x 3,  grossly nonfocal.  Psych: patient is logical and coherent, judgement and insight appear normal, mood and affect appropriate to situation.  Data Reviewed:  Basic Metabolic Panel: Recent Labs  Lab 09/18/24 1314 09/18/24 1325 09/19/24 0521  NA 141 144 142  K 4.0 4.0 3.5  CL 105  --  106  CO2 25  --  30  GLUCOSE 158*  --  117*  BUN 21  --  15  CREATININE 1.33*  --  1.21  CALCIUM  9.2  --  9.3    CBC: Recent Labs  Lab 09/18/24 1314 09/18/24 1325 09/19/24 0521  WBC 9.0  --  4.5  NEUTROABS 7.4  --   --   HGB 11.1* 11.9* 9.7*  HCT 37.1* 35.0* 32.1*  MCV 90.0  --  89.2  PLT 279  --  256     Scheduled Meds:  apixaban   5 mg Oral BID   carvedilol   12.5 mg Oral BID WC   DULoxetine   30 mg Oral Daily   furosemide   60 mg Intravenous Q12H   gabapentin   800 mg Oral QID   levETIRAcetam   250 mg Oral BID   lidocaine   1 patch Transdermal Daily   macitentan   10 mg Oral Daily   Riociguat   2 mg Oral TID   rOPINIRole   1 mg Oral QHS   rosuvastatin   20 mg Oral Daily   spironolactone   25 mg Oral Daily   Continuous Infusions:  sodium chloride      azithromycin  Stopped (09/18/24 2122)   cefTRIAXone  (ROCEPHIN )  IV Stopped (09/18/24 1902)     Assessment & Plan:   Decompensated HFpEF likely due to dietary indiscretion CAP Pulmonary hypertension Improving with diuresis, continue Lasix  60 twice daily, proBNP is only 47, would have low  threshold for de-escalating Lasix  tomorrow if patient continues to improve. Given ground glass opacities on CT and procalcitonin of 0.44, will continue treatment for possible CAP with ceftriaxone  azithromycin . 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 Multiplanar reformatted images are provided for review. MIP images are provided for review. Automated exposure control, iterative reconstruction, and/or weight based adjustment of the mA/kV was utilized to reduce the radiation dose to as low as reasonably achievable. Evaluation is limited secondary to motion artifact. COMPARISON: CT chest 08/14/2023. CLINICAL HISTORY: Pulmonary embolism (PE) suspected, high prob. FINDINGS: PULMONARY ARTERIES: Pulmonary arteries are adequately opacified for evaluation. There is no evidence for pulmonary embolism to the segmental level. Evaluation is limited secondary to motion artifact. Main pulmonary artery is normal in caliber. MEDIASTINUM: The heart is enlarged, unchanged. There is no acute abnormality of the thoracic aorta. LYMPH NODES: Stable mild bilateral hilar lymphadenopathy with lymphadenopathy measuring up to 1 cm. No mediastinal or axillary lymphadenopathy. LUNGS AND  PLEURA: Patchy ground glass opacities in the right lower lobe and bilateral upper lobes. There is atelectasis in the left lower lobe. There is a trace left pleural effusion. No pneumothorax. UPPER ABDOMEN: There is a small hiatal hernia. Left adrenal adenoma is grossly unchanged. SOFT TISSUES AND BONES: Lower Thoracic Spinal Fusion Hardware is present. Degenerative changes affect the spine. No acute soft tissue abnormality. IMPRESSION: 1. No evidence of pulmonary embolism to the segmental level; evaluation limited by motion artifact. 2. Patchy ground glass opacities in the right lower lobe and bilateral upper lobes. Findings may be infectious/inflammatory. 3. Atelectasis in the left lower lobe. 4. Cardiomegaly, unchanged. 5. Stable mild bilateral hilar lymphadenopathy measuring up to 1 cm. 6. Trace left pleural effusion. 7. Small hiatal hernia. Electronically signed by: Greig Pique MD 09/18/2024 05:59 PM EST RP Workstation: [id number]   DG Chest Port  1 View Result Date: 09/18/2024 EXAM: 1 VIEW(S) XRAY OF THE CHEST 09/18/2024 12:54:30 PM COMPARISON: 09/18/2023 CLINICAL HISTORY: sob FINDINGS: LUNGS AND PLEURA: No focal consolidation. No pleural effusion. No pneumothorax. HEART AND MEDIASTINUM: Cardiomegaly. BONES AND SOFT TISSUES: Scoliosis. Partial visualization of lumbar surgical hardware. IMPRESSION: 1. Cardiomegaly. Electronically signed by: Dorethia Molt MD 09/18/2024 01:30 PM EST RP Workstation: [id number]    Principal Problem:   Acute hypoxic respiratory failure (HCC)     Ivan Vangie Pike, [facility]  If 7PM-7AM, please contact night-coverage [email or web address]   LOS: 1 day

## 2024-09-20 LAB — BASIC METABOLIC PANEL WITH GFR
Anion gap: 10 (ref 5–15)
BUN: 16 mg/dL (ref 8–23)
CO2: 29 mmol/L (ref 22–32)
Calcium: 9.1 mg/dL (ref 8.9–10.3)
Chloride: 101 mmol/L (ref 98–111)
Creatinine, Ser: 1.37 mg/dL — ABNORMAL HIGH (ref 0.61–1.24)
GFR, Estimated: 55 mL/min — ABNORMAL LOW (ref >60)
Glucose, Bld: 145 mg/dL — ABNORMAL HIGH (ref 70–99)
Potassium: 3.6 mmol/L (ref 3.5–5.1)
Sodium: 140 mmol/L (ref 135–145)

## 2024-09-20 LAB — MAGNESIUM: Magnesium: 2.2 mg/dL (ref 1.7–2.4)

## 2024-09-20 MED ORDER — AZITHROMYCIN 250 MG PO TABS
500.0000 mg | ORAL_TABLET | ORAL | Status: DC
  Administered 2024-09-20: 500 mg via ORAL
  Filled 2024-09-20: qty 2

## 2024-09-20 NOTE — Plan of Care (Signed)
°  Problem: Clinical Measurements: Goal: Respiratory complications will improve Outcome: Progressing Goal: Cardiovascular complication will be avoided Outcome: Progressing   Problem: Nutrition: Goal: Adequate nutrition will be maintained Outcome: Progressing   Problem: Coping: Goal: Level of anxiety will decrease Outcome: Progressing   Problem: Pain Managment: Goal: General experience of comfort will improve and/or be controlled Outcome: Progressing   Problem: Safety: Goal: Ability to remain free from injury will improve Outcome: Progressing   Problem: Respiratory: Goal: Ability to maintain adequate ventilation will improve Outcome: Progressing Goal: Ability to maintain a clear airway will improve Outcome: Progressing   Problem: Respiratory: Goal: Ability to maintain adequate ventilation will improve Outcome: Progressing Goal: Ability to maintain a clear airway will improve Outcome: Progressing

## 2024-09-20 NOTE — Progress Notes (Signed)
 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  Patient and wife had developed URI symptoms 5 days ago, started getting take away from fast food places, and noted progressive increase in lower extremity edema as well as progressive DOE.  Patient notes he has been taking his medications as usual and has not skipped his torsemide  or anticoagulation.  On day of admission he felt so weak that he was unable to transfer from his wheelchair.  Patient denied fevers or chills or malaise, no new cough.  In ED, patient noted to have O2 sats in the low 80s, chest x-ray unremarkable, CTA negative for PE but did show patchy ground glass opacities in BUL and RLL, patient was treated with Lasix  with marked improvement in symptoms and decreased oxygen  requirement.  Patient was admitted for management of decompensated heart failure likely secondary to dietary indiscretion and possible CAP.  Subjective:  Patient states he continues to feel better, thinks his edema is continuing to get less.  Also thinks his shortness of breath is improving.  Notes he is mostly wheelchair-bound at home so is unable to exercise much, does have a manual wheelchair which he pushes around.  Patient notes he has been coughing up a lot of mucus since he has been on the antibiotics here.   Objective: Vitals:   09/20/24 0359 09/20/24 0817 09/20/24 0832 09/20/24 1221  BP: 139/74 136/66 136/66 110/61  Pulse: 81 86 86 83  Resp: 20 16  14   Temp: 98 F (36.7 C) 97.8 F (36.6 C)  98 F (36.7 C)  TempSrc: Oral Oral  Oral  SpO2: 95% 97%   94%  Weight:      Height:        Intake/Output Summary (Last 24 hours) at 09/20/2024 1649 Last data filed at 09/20/2024 1221 Gross per 24 hour  Intake 240 ml  Output 1525 ml  Net -1285 ml   Filed Weights   09/18/24 1302 09/19/24 1300  Weight: 117.9 kg 134.7 kg     Exam:  General: Patient lying bed in no acute respiratory distress, able to speak in full sentences without difficulty Eyes: sclera anicteric, conjuctiva mild injection bilaterally CVS: S1-S2, regular  Respiratory: Decreased air entry bilaterally with few rales at right base GI: NABS, soft, NT  LE: 2+ edema, compression hose in place Neuro: A/O x 3,  grossly nonfocal.  Psych: patient is logical and coherent, judgement and insight appear normal, mood and affect appropriate to situation.  Data Reviewed:  Basic Metabolic Panel: Recent Labs  Lab 09/18/24 1314 09/18/24 1325 09/19/24 0521 09/20/24 0517  NA 141 144 142 140  K 4.0 4.0 3.5 3.6  CL 105  --  106 101  CO2 25  --  30 29  GLUCOSE 158*  --  117* 145*  BUN 21  --  15 16  CREATININE 1.33*  --  1.21 1.37*  CALCIUM  9.2  --  9.3 9.1  MG  --   --   --  2.2    CBC: Recent Labs  Lab 09/18/24 1314 09/18/24 1325 09/19/24 0521  WBC 9.0  --  4.5  NEUTROABS 7.4  --   --   HGB 11.1* 11.9* 9.7*  HCT 37.1* 35.0* 32.1*  MCV 90.0  --  89.2  PLT 279  --  256     Scheduled Meds:  apixaban   5 mg Oral BID   azithromycin   500 mg Oral Q24H   carvedilol   12.5 mg Oral BID WC   DULoxetine   30 mg Oral Daily   gabapentin   800 mg Oral QID   levETIRAcetam   250 mg Oral BID   lidocaine   1 patch Transdermal Daily   macitentan   10 mg Oral Daily   pantoprazole   40 mg Oral Daily   Riociguat   2 mg Oral TID   rOPINIRole   1 mg Oral QHS   rosuvastatin   20 mg Oral Daily   spironolactone   25 mg Oral Daily   Continuous Infusions:  cefTRIAXone  (ROCEPHIN )  IV 2 g (09/19/24 1743)     Assessment & Plan:   Decompensated HFpEF likely due to dietary  indiscretion CAP Pulmonary hypertension Patient states he like to go home tomorrow assuming he continues to do well. Improving with diuresis, continue Lasix  60 twice daily, proBNP is only 47, patient can be discharged home on usual doses of torsemide .. Given ground glass opacities on CT and procalcitonin of 0.44, will continue treatment for possible CAP with ceftriaxone  azithromycin --patient states he has been coughing up a lot of mucus since antibiotics were started. 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 Multiplanar reformatted images are provided for review. MIP images are provided for review. Automated exposure control, iterative reconstruction, and/or weight based adjustment of the mA/kV was utilized to reduce the radiation dose to as low as reasonably achievable. Evaluation is limited secondary to motion artifact. COMPARISON: CT chest 08/14/2023. CLINICAL HISTORY: Pulmonary embolism (PE) suspected, high prob. FINDINGS: PULMONARY ARTERIES: Pulmonary arteries are adequately opacified for evaluation. There is no evidence for pulmonary embolism to the segmental level. Evaluation is limited secondary to motion artifact. Main pulmonary artery is normal in  caliber. MEDIASTINUM: The heart is enlarged, unchanged. There is no acute abnormality of the thoracic aorta. LYMPH NODES: Stable mild bilateral hilar lymphadenopathy with lymphadenopathy measuring up to 1 cm. No mediastinal or axillary lymphadenopathy. LUNGS AND PLEURA: Patchy ground glass opacities in the right lower lobe and bilateral upper lobes. There is atelectasis in the left lower lobe. There is a trace left pleural effusion. No pneumothorax. UPPER ABDOMEN: There is a small hiatal hernia. Left adrenal adenoma is grossly unchanged. SOFT TISSUES AND BONES: Lower Thoracic Spinal Fusion Hardware is present. Degenerative changes affect the spine. No acute soft tissue abnormality. IMPRESSION: 1. No evidence of pulmonary embolism to the segmental level; evaluation limited by motion artifact. 2. Patchy ground glass opacities in the right lower lobe and bilateral upper lobes. Findings may be infectious/inflammatory.  3. Atelectasis in the left lower lobe. 4. Cardiomegaly, unchanged. 5. Stable mild bilateral hilar lymphadenopathy measuring up to 1 cm. 6. Trace left pleural effusion. 7. Small hiatal hernia. Electronically signed by: Greig Pique MD 09/18/2024 05:59 PM EST RP Workstation: [id number]    Principal Problem:   Acute hypoxic respiratory failure (HCC)     Keith [name], [facility]  If 7PM-7AM, please contact night-coverage [email or web address]   LOS: 2 days

## 2024-09-20 NOTE — Progress Notes (Signed)
 Pharmacy Progress Note  This patient is receiving the antibiotic(s) azithromycin  by the intravenous route. Based on criteria approved by the Pharmacy and Therapeutics Committee and the Infectious Disease Division, the antibiotic(s) is/are being converted to equivalent oral dose form(s). These criteria include:   Patient being treated for a respiratory tract infection, urinary tract infection,  cellulitis, or Clostridium difficile associated diarrhea  The patient is not neutropenic and does not exhibit a GI malabsorption state  The patient is eating (either orally or per tube) and/or has been taking other  orally administered medications for at least 24 hours.  The patient is improving clinically (physician assessment and a 24-hour  Tmax of 100.5 F).  Keith [name], PharmD PGY1 Pharmacy Resident [facility] 09/20/2024 11:19 AM

## 2024-09-21 ENCOUNTER — Other Ambulatory Visit (HOSPITAL_COMMUNITY): Payer: Self-pay

## 2024-09-21 ENCOUNTER — Telehealth (HOSPITAL_COMMUNITY): Payer: Self-pay

## 2024-09-21 LAB — BASIC METABOLIC PANEL WITH GFR
Anion gap: 11 (ref 5–15)
BUN: 14 mg/dL (ref 8–23)
CO2: 25 mmol/L (ref 22–32)
Calcium: 9.2 mg/dL (ref 8.9–10.3)
Chloride: 106 mmol/L (ref 98–111)
Creatinine, Ser: 1.21 mg/dL (ref 0.61–1.24)
GFR, Estimated: >60 mL/min (ref >60)
Glucose, Bld: 171 mg/dL — ABNORMAL HIGH (ref 70–99)
Potassium: 3.9 mmol/L (ref 3.5–5.1)
Sodium: 142 mmol/L (ref 135–145)

## 2024-09-21 MED ORDER — AMOXICILLIN-POT CLAVULANATE 875-125 MG PO TABS
1.0000 | ORAL_TABLET | Freq: Two times a day (BID) | ORAL | 0 refills | Status: AC
  Filled 2024-09-21: qty 4, 2d supply, fill #0

## 2024-09-21 MED ORDER — IPRATROPIUM-ALBUTEROL 0.5-2.5 (3) MG/3ML IN SOLN: 3.0000 mL | RESPIRATORY_TRACT | Status: DC | PRN

## 2024-09-21 MED ORDER — GLUCAGON HCL RDNA (DIAGNOSTIC) 1 MG IJ SOLR: 1.0000 mg | INTRAMUSCULAR | Status: DC | PRN

## 2024-09-21 MED ORDER — TRAZODONE HCL 50 MG PO TABS: 50.0000 mg | ORAL_TABLET | Freq: Every evening | ORAL | Status: DC | PRN

## 2024-09-21 MED ORDER — HYDRALAZINE HCL 20 MG/ML IJ SOLN: 10.0000 mg | INTRAMUSCULAR | Status: DC | PRN

## 2024-09-21 MED ORDER — ONDANSETRON HCL 4 MG/2ML IJ SOLN: 4.0000 mg | Freq: Four times a day (QID) | INTRAMUSCULAR | Status: DC | PRN

## 2024-09-21 MED ORDER — ALBUTEROL SULFATE HFA 108 (90 BASE) MCG/ACT IN AERS
2.0000 | INHALATION_SPRAY | Freq: Four times a day (QID) | RESPIRATORY_TRACT | 2 refills | Status: AC | PRN
  Filled 2024-09-21: qty 6.7, 16d supply, fill #0

## 2024-09-21 MED ORDER — GABAPENTIN 800 MG PO TABS
800.0000 mg | ORAL_TABLET | Freq: Four times a day (QID) | ORAL | 0 refills | Status: DC
  Filled 2024-09-30: qty 120, 30d supply, fill #0

## 2024-09-21 MED ORDER — HYDROCODONE-ACETAMINOPHEN 10-325 MG PO TABS
1.0000 | ORAL_TABLET | Freq: Every day | ORAL | 0 refills | Status: AC
  Filled 2024-09-30: qty 180, 30d supply, fill #0

## 2024-09-21 MED ORDER — METOPROLOL TARTRATE 5 MG/5ML IV SOLN: 5.0000 mg | INTRAVENOUS | Status: DC | PRN

## 2024-09-21 MED ORDER — TORSEMIDE 20 MG PO TABS: 60.0000 mg | ORAL_TABLET | Freq: Every day | ORAL | Status: DC

## 2024-09-21 NOTE — Hospital Course (Addendum)
 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  Patient and wife had developed URI symptoms 5 days ago, started getting take away from fast food places, and noted progressive increase in lower extremity edema as well as progressive DOE.  Patient notes he has been taking his medications as usual and has not skipped his torsemide  or anticoagulation.  On day of admission he felt so weak that he was unable to transfer from his wheelchair.  Patient denied fevers or chills or malaise, no new cough.  In ED, patient noted to have O2 sats in the low 80s, chest x-ray unremarkable, CTA negative for PE but did show patchy ground glass opacities in BUL and RLL, patient was treated with Lasix  with marked improvement in symptoms and decreased oxygen  requirement.  Patient was admitted for management of decompensated heart failure likely secondary to dietary indiscretion and possible CAP. Patient is feeling much better today wishing to go home.   Assessment & Plan:   Decompensated HFpEF likely due to dietary indiscretion CAP Pulmonary hypertension Feeling much better today wishing to go home CTA concerning for patchy opacity possible fluid overload versus pneumonia.  Improved with diuretics.  Completed azithromycin , switch to p.o. Augmentin  to 2 more days to complete the course.  Bronchodilators as needed.  Will need repeat CT chest in 4 to 6 weeks  AKI Improved with diuresis and improved Star[employer] forces   Chronic thromboembolic disease Continue Eliquis .  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   Examination:  General exam: Appears calm and comfortable  Respiratory system: Clear to auscultation. Respiratory effort normal. Cardiovascular system: S1 & S2 heard, RRR. No JVD, murmurs, rubs, gallops or clicks. No pedal edema. Gastrointestinal system: Abdomen is nondistended, soft and nontender. No organomegaly or masses felt. Normal bowel sounds heard. Central nervous system: Alert and oriented. No focal neurological deficits. Extremities: Symmetric 5 x 5 power. Skin: No rashes, lesions or ulcers Psychiatry: Judgement and insight appear normal. Mood & affect appropriate.

## 2024-09-21 NOTE — Telephone Encounter (Signed)
 Advanced Heart Failure Patient Advocate Encounter  Prior authorization for O[employer]  has been submitted and approved. Test billing returns refill too soon rejection; unable to confirm copay at this time.  Key: [id number] Effective: 09/21/2024 to 10/07/2025  Rachel DEL, CPhT Rx Patient Advocate Phone: [phone]

## 2024-09-21 NOTE — Discharge Summary (Signed)
 Physician Discharge Summary  Keith [name] FMW:[id number] DOB: 11/19/1950 DOA: 09/18/2024  PCP: Jordan, [name], MD  Admit date: 09/18/2024 Discharge date: 09/21/2024  Admitted From: Home Disposition: Home  Recommendations for Outpatient Follow-up:  Follow up with PCP in 1-2 weeks Please obtain BMP/CBC in one week your next doctors visit.  Continue home torsemide /diuretics 2 more days of p.o. Augmentin .  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  Patient and wife had developed URI symptoms 5 days ago, started getting take away from fast food places, and noted progressive increase in lower extremity edema as well as progressive DOE.  Patient notes he has been taking his medications as usual and has not skipped his torsemide  or anticoagulation.  On day of admission he felt so weak that he was unable to transfer from his wheelchair.  Patient denied fevers or chills or malaise, no new cough.  In ED, patient noted to have O2 sats in the low 80s, chest x-ray unremarkable, CTA negative for PE but did show patchy ground glass opacities in BUL and RLL, patient was treated with Lasix  with marked improvement in symptoms and decreased oxygen  requirement.  Patient was admitted for management of decompensated heart failure likely secondary to dietary indiscretion and possible CAP. Patient is feeling much better today wishing to go home.   Assessment & Plan:   Decompensated HFpEF likely due to dietary indiscretion CAP Pulmonary hypertension Feeling much better  today wishing to go home CTA concerning for patchy opacity possible fluid overload versus pneumonia.  Improved with diuretics.  Completed azithromycin , switch to p.o. Augmentin  to 2 more days to complete the course.  Bronchodilators as needed.  Will need repeat CT chest in 4 to 6 weeks  AKI Improved with diuresis and improved [employer]   Chronic thromboembolic disease Continue Eliquis .  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   Examination:  General exam: Appears calm and comfortable  Respiratory system: Clear to auscultation. Respiratory effort normal. Cardiovascular system: S1 & S2 heard, RRR. No JVD, murmurs, rubs, gallops or clicks. No pedal edema. Gastrointestinal system: Abdomen is nondistended, soft and nontender. No organomegaly or masses felt. Normal bowel sounds heard. Central nervous system: Alert and oriented. No focal neurological deficits. Extremities: Symmetric 5 x 5 power. Skin: No rashes, lesions or ulcers Psychiatry: Judgement and insight appear normal. Mood & affect appropriate.    Discharge Diagnoses:  Principal Problem:   Acute hypoxic respiratory failure (HCC)      Discharge Exam: Vitals:   09/21/24 0746 09/21/24 0829  BP: (!) 112/51 135/78  Pulse: 83 88  Resp: 12   Temp: 97.9 F (36.6 C)  SpO2: 95%    Vitals:   09/21/24 0028 09/21/24 0431 09/21/24 0746 09/21/24 0829  BP: 130/72 129/63 (!) 112/51 135/78  Pulse: 81 86 83 88  Resp: 16 14 12    Temp: 98 F (36.7 C) 98.3 F (36.8 C) 97.9 F (36.6 C)   TempSrc: Oral  Oral Oral   SpO2: 94% 94% 95%   Weight:      Height:          Discharge Instructions   Allergies as of 09/21/2024       Reactions   Aspirin  Other (See Comments)   Irritates the stomach and the patient developed ulcers, also   Lisinopril  Other (See Comments)   Caused a body ache        Medication List     TAKE these medications    rOPINIRole  1 MG tablet Commonly known as: REQUIP  TAKE 1 TABLET BY MOUTH AT  BEDTIME The timing of this medication is very important.   Adempas  2 MG Tabs Generic drug: Riociguat  TAKE 1 TABLET THREE TIMES A DAY (IN THE MORNING, AT NOON, AND AT BEDTIME)   albuterol  108 (90 Base) MCG/ACT inhaler Commonly known as: VENTOLIN  HFA Inhale 2 puffs into the lungs every 6 (six) hours as needed for wheezing or shortness of breath.   amLODipine  10 MG tablet Commonly known as: NORVASC  TAKE 1 TABLET BY MOUTH DAILY   amoxicillin -clavulanate 875-125 MG tablet Commonly known as: AUGMENTIN  Take 1 tablet by mouth 2 (two) times daily for 2 days.   carvedilol  12.5 MG tablet Commonly known as: COREG  TAKE 1 TABLET (12.5MG  TOTAL) BY MOUTH TWICE A DAY WITH MEALS   cholecalciferol  25 MCG (1000 UNIT) tablet Commonly known as: VITAMIN D3 Take 1,000 Units by mouth daily.   DULoxetine  30 MG capsule Commonly known as: CYMBALTA  TAKE 1 CAPSULE BY MOUTH DAILY   Eliquis  5 MG Tabs tablet Generic drug: apixaban  TAKE 1 TABLET BY MOUTH TWICE A DAY   ferrous sulfate  325 (65 FE) MG tablet TAKE 1 TABLET BY MOUTH EVERY DAY WITH BREAKFAST   gabapentin  800 MG tablet Commonly known as: NEURONTIN  Take 1 tablet (800 mg total) by mouth 4 (four) times daily for neuropathy. What changed: Another medication with the same name was removed. Continue taking this medication, and follow the directions you see here.   HYDROcodone -acetaminophen  10-325 MG tablet Commonly known as: NORCO Take 1 tablet by mouth 6 (six) times daily as needed for pain. What changed: Another  medication with the same name was removed. Continue taking this medication, and follow the directions you see here.   latanoprost  0.005 % ophthalmic solution Commonly known as: XALATAN  Place 1 drop into both eyes every evening.   levETIRAcetam  250 MG tablet Commonly known as: KEPPRA  TAKE 1 TABLET BY MOUTH TWICE  DAILY   lidocaine  5 % Commonly known as: LIDODERM  Place 1 patch onto the skin daily as needed (pain). Remove & Discard patch within 12 hours or as directed by MD   naloxone  4 MG/0.1ML Liqd nasal spray kit Commonly known as: Narcan  Place 1 spray into the nose as needed. What changed: Another medication with the same name was removed. Continue taking this medication, and follow the directions you see here.   omeprazole  40 MG capsule Commonly known as: PRILOSEC TAKE 1 CAPSULE BY MOUTH DAILY What changed:  when to take this reasons to take this   Opsumit  10 MG tablet Generic drug: macitentan  Take 1 tablet daily.   polyethylene glycol 17 g packet  Commonly known as: MIRALAX  / GLYCOLAX  Take 17 g by mouth daily as needed for mild constipation.   potassium chloride  SA 20 MEQ tablet Commonly known as: KLOR-CON  M Take 1 tablet (20 mEq total) by mouth daily. What changed:  when to take this reasons to take this   rosuvastatin  20 MG tablet Commonly known as: CRESTOR  TAKE 1 TABLET BY MOUTH DAILY   senna-docusate 8.6-50 MG tablet Commonly known as: Senokot-S Take 2 tablets by mouth at bedtime. What changed:  when to take this reasons to take this   spironolactone  25 MG tablet Commonly known as: ALDACTONE  TAKE 1 TABLET BY MOUTH DAILY   torsemide  20 MG tablet Commonly known as: DEMADEX  Take 3 tablets (60 mg total) by mouth daily. What changed: See the new instructions.   traZODone  50 MG tablet Commonly known as: DESYREL  TAKE 1/2 TO 1 TABLET BY MOUTH AT BEDTIME AS NEEDED FOR SLEEP   vitamin C  250 MG tablet Commonly known as: ASCORBIC ACID  Take 250 mg by mouth  daily.        Follow-up Information     Jordan, [name], MD Follow up in 1 week(s).   Specialty: Family Medicine Contact information: [address] [place] [address] [phone]                Allergies[1]  You were cared for by a hospitalist during your hospital stay. If you have any questions about your discharge medications or the care you received while you were in the hospital after you are discharged, you can call the unit and asked to speak with the hospitalist on call if the hospitalist that took care of you is not available. Once you are discharged, your primary care physician will handle any further medical issues. Please note that no refills for any discharge medications will be authorized once you are discharged, as it is imperative that you return to your primary care physician (or establish a relationship with a primary care physician if you do not have one) for your aftercare needs so that they can reassess your need for medications and monitor your lab values.  You were cared for by a hospitalist during your hospital stay. If you have any questions about your discharge medications or the care you received while you were in the hospital after you are discharged, you can call the unit and asked to speak with the hospitalist on call if the hospitalist that took care of you is not available. Once you are discharged, your primary care physician will handle any further medical issues. Please note that NO REFILLS for any discharge medications will be authorized once you are discharged, as it is imperative that you return to your primary care physician (or establish a relationship with a primary care physician if you do not have one) for your aftercare needs so that they can reassess your need for medications and monitor your lab values.  Please request your Prim.MD to go over all Hospital Tests and Procedure/Radiological results at the follow up, please get all  Hospital records sent to your Prim MD by signing hospital release before you go home.  Get CBC, CMP, 2 view Chest X ray checked  by Primary MD during your next visit or SNF MD in 5-7 days ( we routinely change or add medications that can affect your baseline labs and fluid status, therefore we recommend that you get the mentioned basic workup next visit with your PCP, your PCP may decide not to  get them or add new tests based on their clinical decision)  On your next visit with your primary care physician please Get Medicines reviewed and adjusted.  If you experience worsening of your admission symptoms, develop shortness of breath, life threatening emergency, suicidal or homicidal thoughts you must seek medical attention immediately by calling 911 or calling your MD immediately  if symptoms less severe.  You Must read complete instructions/literature along with all the possible adverse reactions/side effects for all the Medicines you take and that have been prescribed to you. Take any new Medicines after you have completely understood and accpet all the possible adverse reactions/side effects.   Do not drive, operate heavy machinery, perform activities at heights, swimming or participation in water  activities or provide baby sitting services if your were admitted for syncope or siezures until you have seen by Primary MD or a Neurologist and advised to do so again.  Do not drive when taking Pain medications.   Procedures/Studies: CT Angio Chest PE W and/or Wo Contrast Result Date: 09/18/2024 EXAM: CTA of the Chest with contrast for PE 09/18/2024 04:53:28 PM TECHNIQUE: CTA of the chest was performed after the administration of intravenous contrast. Multiplanar reformatted images are provided for review. MIP images are provided for review. Automated exposure control, iterative reconstruction, and/or weight based adjustment of the mA/kV was utilized to reduce the radiation dose to as low as reasonably  achievable. Evaluation is limited secondary to motion artifact. COMPARISON: CT chest 08/14/2023. CLINICAL HISTORY: Pulmonary embolism (PE) suspected, high prob. FINDINGS: PULMONARY ARTERIES: Pulmonary arteries are adequately opacified for evaluation. There is no evidence for pulmonary embolism to the segmental level. Evaluation is limited secondary to motion artifact. Main pulmonary artery is normal in caliber. MEDIASTINUM: The heart is enlarged, unchanged. There is no acute abnormality of the thoracic aorta. LYMPH NODES: Stable mild bilateral hilar lymphadenopathy with lymphadenopathy measuring up to 1 cm. No mediastinal or axillary lymphadenopathy. LUNGS AND PLEURA: Patchy ground glass opacities in the right lower lobe and bilateral upper lobes. There is atelectasis in the left lower lobe. There is a trace left pleural effusion. No pneumothorax. UPPER ABDOMEN: There is a small hiatal hernia. Left adrenal adenoma is grossly unchanged. SOFT TISSUES AND BONES: Lower Thoracic Spinal Fusion Hardware is present. Degenerative changes affect the spine. No acute soft tissue abnormality. IMPRESSION: 1. No evidence of pulmonary embolism to the segmental level; evaluation limited by motion artifact. 2. Patchy ground glass opacities in the right lower lobe and bilateral upper lobes. Findings may be infectious/inflammatory. 3. Atelectasis in the left lower lobe. 4. Cardiomegaly, unchanged. 5. Stable mild bilateral hilar lymphadenopathy measuring up to 1 cm. 6. Trace left pleural effusion. 7. Small hiatal hernia. Electronically signed by: Greig Pique MD 09/18/2024 05:59 PM EST RP Workstation: [id number]   DG Chest Port 1 View Result Date: 09/18/2024 EXAM: 1 VIEW(S) XRAY OF THE CHEST 09/18/2024 12:54:30 PM COMPARISON: 09/18/2023 CLINICAL HISTORY: sob FINDINGS: LUNGS AND PLEURA: No focal consolidation. No pleural effusion. No pneumothorax. HEART AND MEDIASTINUM: Cardiomegaly. BONES AND SOFT TISSUES: Scoliosis. Partial  visualization of lumbar surgical hardware. IMPRESSION: 1. Cardiomegaly. Electronically signed by: Dorethia Molt MD 09/18/2024 01:30 PM EST RP Workstation: [id number]     The results of significant diagnostics from this hospitalization (including imaging, microbiology, ancillary and laboratory) are listed below for reference.     Microbiology: Recent Results (from the past 240 hours)  Resp panel by RT-PCR (RSV, Flu A&B, Covid) Anterior Nasal Swab     Status: None  Collection Time: 09/18/24  1:14 PM   Specimen: Anterior Nasal Swab  Result Value Ref Range Status   SARS Coronavirus 2 by RT PCR NEGATIVE NEGATIVE Final   Influenza A by PCR NEGATIVE NEGATIVE Final   Influenza B by PCR NEGATIVE NEGATIVE Final    Comment: (NOTE) The Xpert Xpress SARS-CoV-2/FLU/RSV plus assay is intended as an aid in the diagnosis of influenza from Nasopharyngeal swab specimens and should not be used as a sole basis for treatment. Nasal washings and aspirates are unacceptable for Xpert Xpress SARS-CoV-2/FLU/RSV testing.  Fact Sheet for Patients: bloggercourse.com  Fact Sheet for Healthcare Providers: seriousbroker.it  This test is not yet approved or cleared by the [place]  FDA and has been authorized for detection and/or diagnosis of SARS-CoV-2 by FDA under an Emergency Use Authorization (EUA). This EUA will remain in effect (meaning this test can be used) for the duration of the COVID-19 declaration under Section 564(b)(1) of the Act, 21 U.S.C. section 360bbb-3(b)(1), unless the authorization is terminated or revoked.     Resp Syncytial Virus by PCR NEGATIVE NEGATIVE Final    Comment: (NOTE) Fact Sheet for Patients: bloggercourse.com  Fact Sheet for Healthcare Providers: seriousbroker.it  This test is not yet approved or cleared by the [place]  FDA and has been authorized for detection  and/or diagnosis of SARS-CoV-2 by FDA under an Emergency Use Authorization (EUA). This EUA will remain in effect (meaning this test can be used) for the duration of the COVID-19 declaration under Section 564(b)(1) of the Act, 21 U.S.C. section 360bbb-3(b)(1), unless the authorization is terminated or revoked.  Performed at [facility], [address], [place], [place] [address]      Labs: BNP (last 3 results) Recent Labs    12/30/23 1005 05/14/24 1019 09/18/24 1314  BNP 26.4 13.1 46.8   Basic Metabolic Panel: Recent Labs  Lab 09/18/24 1314 09/18/24 1325 09/19/24 0521 09/20/24 0517 09/21/24 0527  NA 141 144 142 140 142  K 4.0 4.0 3.5 3.6 3.9  CL 105  --  106 101 106  CO2 25  --  30 29 25   GLUCOSE 158*  --  117* 145* 171*  BUN 21  --  15 16 14   CREATININE 1.33*  --  1.21 1.37* 1.21  CALCIUM  9.2  --  9.3 9.1 9.2  MG  --   --   --  2.2  --    Liver Function Tests: Recent Labs  Lab 09/18/24 1314 09/19/24 0521  AST 26 22  ALT 18 17  ALKPHOS 82 76  BILITOT 0.5 0.6  PROT 7.4 6.8  ALBUMIN  3.6 3.2*   No results for input(s): LIPASE, AMYLASE in the last 168 hours. No results for input(s): AMMONIA in the last 168 hours. CBC: Recent Labs  Lab 09/18/24 1314 09/18/24 1325 09/19/24 0521  WBC 9.0  --  4.5  NEUTROABS 7.4  --   --   HGB 11.1* 11.9* 9.7*  HCT 37.1* 35.0* 32.1*  MCV 90.0  --  89.2  PLT 279  --  256   Cardiac Enzymes: No results for input(s): CKTOTAL, CKMB, CKMBINDEX, TROPONINI in the last 168 hours. BNP: Invalid input(s): POCBNP CBG: No results for input(s): GLUCAP in the last 168 hours. D-Dimer No results for input(s): DDIMER in the last 72 hours. Hgb A1c No results for input(s): HGBA1C in the last 72 hours. Lipid Profile No results for input(s): CHOL, HDL, LDLCALC, TRIG, CHOLHDL, LDLDIRECT in the last 72 hours. Thyroid  function  studies No results for input(s): TSH, T4TOTAL, T3FREE, THYROIDAB  in the last 72 hours.  Invalid input(s): FREET3 Anemia work up No results for input(s): VITAMINB12, FOLATE, FERRITIN, TIBC, IRON, RETICCTPCT in the last 72 hours. Urinalysis    Component Value Date/Time   COLORURINE YELLOW 03/27/2022 2016   APPEARANCEUR CLOUDY (A) 03/27/2022 2016   LABSPEC 1.020 03/27/2022 2016   PHURINE 8.0 03/27/2022 2016   GLUCOSEU NEGATIVE 03/27/2022 2016   HGBUR NEGATIVE 03/27/2022 2016   HGBUR negative 10/26/2010 0824   BILIRUBINUR NEGATIVE 03/27/2022 2016   BILIRUBINUR n 01/12/2019 1020   KETONESUR NEGATIVE 03/27/2022 2016   PROTEINUR NEGATIVE 03/27/2022 2016   UROBILINOGEN 0.2 01/12/2019 1020   UROBILINOGEN 0.2 04/10/2013 1555   NITRITE POSITIVE (A) 03/27/2022 2016   LEUKOCYTESUR NEGATIVE 03/27/2022 2016   Sepsis Labs Recent Labs  Lab 09/18/24 1314 09/19/24 0521  WBC 9.0 4.5   Microbiology Recent Results (from the past 240 hours)  Resp panel by RT-PCR (RSV, Flu A&B, Covid) Anterior Nasal Swab     Status: None   Collection Time: 09/18/24  1:14 PM   Specimen: Anterior Nasal Swab  Result Value Ref Range Status   SARS Coronavirus 2 by RT PCR NEGATIVE NEGATIVE Final   Influenza A by PCR NEGATIVE NEGATIVE Final   Influenza B by PCR NEGATIVE NEGATIVE Final    Comment: (NOTE) The Xpert Xpress SARS-CoV-2/FLU/RSV plus assay is intended as an aid in the diagnosis of influenza from Nasopharyngeal swab specimens and should not be used as a sole basis for treatment. Nasal washings and aspirates are unacceptable for Xpert Xpress SARS-CoV-2/FLU/RSV testing.  Fact Sheet for Patients: bloggercourse.com  Fact Sheet for Healthcare Providers: seriousbroker.it  This test is not yet approved or cleared by the [place]  FDA and has been authorized for detection and/or diagnosis of SARS-CoV-2 by FDA under an Emergency Use Authorization (EUA). This EUA will remain in effect (meaning this test can  be used) for the duration of the COVID-19 declaration under Section 564(b)(1) of the Act, 21 U.S.C. section 360bbb-3(b)(1), unless the authorization is terminated or revoked.     Resp Syncytial Virus by PCR NEGATIVE NEGATIVE Final    Comment: (NOTE) Fact Sheet for Patients: bloggercourse.com  Fact Sheet for Healthcare Providers: seriousbroker.it  This test is not yet approved or cleared by the [place]  FDA and has been authorized for detection and/or diagnosis of SARS-CoV-2 by FDA under an Emergency Use Authorization (EUA). This EUA will remain in effect (meaning this test can be used) for the duration of the COVID-19 declaration under Section 564(b)(1) of the Act, 21 U.S.C. section 360bbb-3(b)(1), unless the authorization is terminated or revoked.  Performed at [facility], [address] [place], [place] [address]      Time coordinating discharge:  I have spent 35 minutes face to face with the patient and on the ward discussing the patients care, assessment, plan and disposition with other care givers. >50% of the time was devoted counseling the patient about the risks and benefits of treatment/Discharge disposition and coordinating care.   SIGNED:   Burgess JAYSON Dare, MD  [facility] 09/21/2024, 12:49 PM   If 7PM-7AM, please contact night-coverage      [1]  Allergies Allergen Reactions   Aspirin  Other (See Comments)    Irritates the stomach and the patient developed ulcers, also   Lisinopril  Other (See Comments)    Caused a body ache

## 2024-09-21 NOTE — TOC Initial Note (Signed)
 Transition of Care [facility]) - Initial/Assessment Note    Patient Details  Name: Keith [name] MRN: [id number] Date of Birth: Feb 28, 1951  Transition of Care [facility]) CM/SW Contact:    Sudie Erminio Deems, RN Phone Number: 09/21/2024, 11:35 AM  Clinical Narrative:  Patient presented for shortness of breath.  PTA patient was from home with spouse. Patient has had several back surgeries and is wheelchair bound. Patient has DME: wheelchair, walker, BSC, rollator. 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 ICM did call the [facility] Fees Coordinator to make them aware of hospitalization. Patient states he is not active with any HH Services. No home needs identified. Spouse will bring the patients wheelchair to the hospital and transport home via private vehicle.  No further needs identified at this time.       Expected Discharge Plan: Home/Self Care Barriers to Discharge: No Barriers Identified   Patient Goals and CMS Choice Patient states their goals for this hospitalization and ongoing recovery are:: Plans to return home with spouse   Choice offered to / list presented to : NA      Expected Discharge Plan and Services   Discharge Planning Services: CM Consult Post Acute Care Choice: NA Living arrangements for the past 2 months: Single Family Home Expected Discharge Date: 09/21/24                 DME Agency: NA       HH Arranged: NA  Prior Living Arrangements/Services Living arrangements for the past 2 months: Single Family Home Lives with:: Spouse Patient language and need for interpreter reviewed:: Yes Do you feel safe going back to the place where you live?: Yes      Need for Family Participation in Patient Care: Yes (Comment) Care giver support system in place?: Yes (comment) Current home services: DME (wheelchair, walker, BSC, rollator) Criminal Activity/Legal Involvement Pertinent to Current Situation/Hospitalization: No - Comment as  needed  Activities of Daily Living   ADL Screening (condition at time of admission) Independently performs ADLs?: No Does the patient have a NEW difficulty with bathing/dressing/toileting/self-feeding that is expected to last >3 days?: No Does the patient have a NEW difficulty with getting in/out of bed, walking, or climbing stairs that is expected to last >3 days?: No Does the patient have a NEW difficulty with communication that is expected to last >3 days?: No Is the patient deaf or have difficulty hearing?: No Does the patient have difficulty seeing, even when wearing glasses/contacts?: No Does the patient have difficulty concentrating, remembering, or making decisions?: No  Permission Sought/Granted Permission sought to share information with : Family Supports, Case Manager                Emotional Assessment Appearance:: Appears stated age Attitude/Demeanor/Rapport: Engaged Affect (typically observed): Appropriate Orientation: : Oriented to Self, Oriented to Place, Oriented to  Time, Oriented to Situation Alcohol / Substance Use: Not Applicable Psych Involvement: No (comment)  Admission diagnosis:  Acute respiratory failure with hypoxia (HCC) [J96.01] Acute on chronic congestive heart failure, unspecified heart failure type (HCC) [I50.9] Acute hypoxic respiratory failure (HCC) [J96.01] Patient Active Problem List   Diagnosis Date Noted   Acute hypoxic respiratory failure (HCC) 09/18/2024   Iron deficiency anemia 09/21/2023   Diverticulosis of colon without hemorrhage 09/21/2023   Grade III internal hemorrhoids 09/21/2023   Noninfectious gastroenteritis 09/21/2023   Cecal polyp 09/21/2023   Adenomatous polyp of descending colon 09/21/2023   Acute on  chronic anemia 09/21/2023   Heme positive stool 09/20/2023   Anemia due to chronic blood loss 09/20/2023   Symptomatic anemia 09/18/2023   Pressure injury of left buttock, stage 2 (HCC) 07/01/2023   Bilateral lower  extremity edema 03/20/2023   Obesity (BMI 30-39.9) 01/02/2023   Gastroesophageal reflux disease 12/14/2022   Acute respiratory failure with hypoxia (HCC) 10/23/2022   Rheumatoid factor positive 10/23/2022   Chronic pulmonary hypertension (HCC) 10/23/2022   Chest pain 03/01/2022   Elevated liver enzymes 03/01/2022   Myofascial pain 10/30/2021   Controlled type 2 diabetes mellitus with hyperglycemia, without long-term current use of insulin  (HCC)    Bilateral leg weakness 08/08/2021   CAP (community acquired pneumonia) 11/19/2020   Loculated pleural effusion 11/19/2020   DNR (do not resuscitate) 11/19/2020   Closed right ankle fracture    Labile blood glucose    Hypokalemia    Drug induced constipation    Debility    Hyponatremia    Diabetic peripheral neuropathy (HCC)    Acute blood loss anemia    Chronic pain syndrome    Postoperative pain    Closed right trimalleolar fracture 07/02/2020   Chronic respiratory failure with hypoxia (HCC)    Lumbar spinal stenosis 06/01/2020   Spinal cord compression (HCC) 04/02/2020   Thoracic myelopathy 07/25/2018   Hyperlipidemia associated with type 2 diabetes mellitus (HCC) 11/14/2016   Type 2 diabetes mellitus with diabetic neuropathy, unspecified (HCC) 11/14/2016   Routine general medical examination at a health care facility 11/14/2015   Hiatal hernia 08/30/2014   CKD (chronic kidney disease), stage III (HCC) 04/10/2013   Allergic rhinitis 04/10/2013   Spinal stenosis of lumbar region 11/06/2012   Allergic rhinitis due to pollen 01/15/2011   Osteoarthritis 11/02/2008   HIP PAIN, LEFT 02/09/2008   Morbid obesity (HCC) BMI 36+ OSA,GERD,DM II,OA 11/07/2007   ERECTILE DYSFUNCTION, MILD 11/07/2007   Essential hypertension 11/07/2007   PCP:  Jordan, [name], MD Pharmacy:   [facility] - [place], [place] - [address] [place]  [address] Phone: [phone] Fax:  [phone]  [facility] - [place], [place] - [address] [facility] [address] [place] [address] Phone: [phone] Fax: [phone]  [facility] [address], [place] - [address] [place] [address] Phone: [phone] Fax: [phone]  [place] [facility] [address] [place] [address] Phone: [phone] Fax: [phone]  [id number] [place], [place] - [address] [place] [address] Phone: [phone] Fax: [phone]     Social Drivers of Health (SDOH) Social History: SDOH Screenings   Food Insecurity: No Food Insecurity (09/19/2024)  Housing: Low Risk (09/19/2024)  Transportation Needs: No Transportation Needs (09/19/2024)  Utilities: Not At Risk (09/19/2024)  Alcohol Screen: Low Risk (03/16/2023)  Depression (PHQ2-9): Low Risk (04/07/2024)  Financial Resource Strain: Low Risk (04/06/2024)  Physical Activity: Inactive (04/06/2024)  Social Connections: Moderately Integrated (09/19/2024)  Stress: No Stress Concern Present (04/06/2024)  Tobacco Use: Medium Risk (09/19/2024)   SDOH Interventions:     Readmission Risk Interventions    10/24/2022   12:05 PM 03/06/2022    9:59 AM  Readmission Risk Prevention Plan  Transportation Screening Complete Complete  PCP or Specialist Appt within 5-7 Days Complete   PCP or Specialist Appt within 3-5 Days  Complete  Home Care Screening Complete   Medication Review (RN CM)  Complete   HRI or Home Care Consult  Complete  Social Work Consult for Recovery Care Planning/Counseling  Complete  Palliative Care Screening  Not Applicable  Medication Review (RN Care Manager)  Referral to Pharmacy

## 2024-09-21 NOTE — Care Management Important Message (Signed)
 Important Message  Patient Details  Name: Keith [name] MRN: [id number] Date of Birth: 09-14-1951   Important Message Given:  Yes - Medicare IM     [name] 09/21/2024, 12:30 PM

## 2024-09-22 ENCOUNTER — Telehealth: Payer: Self-pay

## 2024-09-22 NOTE — Transitions of Care (Post Inpatient/ED Visit) (Signed)
° °  09/22/2024  Name: Keith [name] MRN: [id number] DOB: 1951/07/09  Today's TOC FU Call Status: Today's TOC FU Call Status:: Unsuccessful Call (2nd Attempt) Unsuccessful Call (2nd Attempt) Date: 09/22/24  Attempted to reach the patient regarding the most recent Inpatient/ED visit.  Follow Up Plan: Additional outreach attempts will be made to reach the patient to complete the Transitions of Care (Post Inpatient/ED visit) call.   Alan Ee, RN, BSN, CEN [employer]- Transition of Care Team.  [facility] [phone]

## 2024-09-22 NOTE — Transitions of Care (Post Inpatient/ED Visit) (Signed)
° °  09/22/2024  Name: Keith [name] MRN: [id number] DOB: 1950/11/27  Today's TOC FU Call Status: Today's TOC FU Call Status:: Unsuccessful Call (1st Attempt) Unsuccessful Call (1st Attempt) Date: 09/22/24  Attempted to reach the patient regarding the most recent Inpatient/ED visit.  Follow Up Plan: Additional outreach attempts will be made to reach the patient to complete the Transitions of Care (Post Inpatient/ED visit) call.   Alan Ee, RN, BSN, CEN [employer]- Transition of Care Team.  [facility] [phone]

## 2024-09-23 ENCOUNTER — Telehealth: Payer: Self-pay

## 2024-09-23 NOTE — Transitions of Care (Post Inpatient/ED Visit) (Signed)
° °  09/23/2024  Name: Keith [name] MRN: [id number] DOB: Mar 26, 1951  Today's TOC FU Call Status: Today's TOC FU Call Status:: Unsuccessful Call (3rd Attempt)  Attempted to reach the patient regarding the most recent Inpatient/ED visit.  Follow Up Plan: No further outreach attempts will be made at this time. We have been unable to contact the patient.  Alan Ee, RN, BSN, CEN [employer]- Transition of Care Team.  [facility] [phone]

## 2024-09-30 ENCOUNTER — Other Ambulatory Visit (HOSPITAL_COMMUNITY): Payer: Self-pay

## 2024-10-02 NOTE — Progress Notes (Signed)
 Patient meets discharge criteria:  Vital signs returned to baseline  O2 saturations are stable and at baseline  RASS score -1 to +1  Absent of nausea and vomiting.  Skin warm and dry.  Pain score <5 on 0-10 scale or at baseline  Protective reflexes are present  Absent of bladder distention.  Tube, catheters drains patent  Oral temperature > 35.5C  Dressing dry; or marked if drainage is present.   PIV removed with tip intact. Discussed results of procedure with physician and verbalized understanding. Telemetry discontinued.   Patient discharge instructions given to patient and family member. Reviewed sedation, S&S to report to provider, follow-up appointments, prescription medications,  diet, and activity, with patient and family member/friend.   Patient discharged with printed AVS instructions. Patient verbalized understanding and ability to comply. The personal belongings were returned to the patient. Patient and family member/friend state that they have no further questions at this time and will contact the numbers provided if any issues arise.  Patient states they feel comfortable leaving.     Pt transported for discharge by transporter/family/friend in wheelchair and sent home to be cared by self and family/friend.  Discharge time: 10/02/2024 Discharge destination: Home  Signed: ADELINA MARINE, RN                 10/02/2024

## 2024-10-02 NOTE — Discharge Summary (Signed)
 "                      [facility]     Cardiology Outpatient Procedure (COD) / Same Day PCI                                     Discharge Summary   Admit Date: 10/02/2024  Discharge Date: 10/02/2024  Admitting Physician: Rogerio Ley, MD  Discharge Physician: Rogerio Ley, MD  Primary Care Provider: Jordan, [name], MD  Primary Cardiologist: Dr. Rolan [facility])    Discharge Type:  Same day BPA   Admission Diagnoses:  CTEPH (chronic thromboembolic pulmonary hypertension) (CMS/HHS-HCC) [I27.24]  Discharge Diagnoses:  Principal Problem:   CTEPH (chronic thromboembolic pulmonary hypertension) (CMS/HHS-HCC) Active Problems:   S/P balloon angioplasty of pulmonary artery branches Resolved Problems:   * No resolved hospital problems. *     Anticipatory Guidance (key med changes, results pending, future labs, IV therapies):   - s/p 4th BPA - Per Dr. Rogerio, suspect he will not need more BPAs - Resume Eliquis  12/27 evening - Instructed for patient to hold torsemide  for the next few days and then to resume taking torsemide  on Monday, 12/29, with plan for up-titration of dose. Instructed patient to discuss this with his primary cardiology team at [facility] (Dr. Cherrie and Dr. Rolan)    Cardiac Rehab: Not indicated for this encounter  Patient Discharge Instructions:   Other activity instructions:  Order Comments: See attached instructions.   May shower, but no soaking tub baths  Order Comments: See attached instructions.   Weigh yourself daily and record   Notify cardiology provider of chest pain   Notify provider of swelling in arms, legs, or stomach   Notify Provider of bleeding or expanding swelling at groin catheter access site   Notify provider temperature greater than 101.0 F (38.3 C) degrees   Notify provider of dizziness or passing out   Notify provider of weight gain greater than 2 lbs in 1 day or 5 lbs in 1 week   Notify provider  of difficulty breathing or shortness of breath   Report questions or concerns to the [facility] at [phone]   Notify primary care physician of other symptoms   For a life-threatening emergency, call 911   Follow-up with Primary Care Provider   Follow-up with Cardiology   Regular      No future appointments.  Non-[facility] Provider Follow-up: none  Report Issues: By calling PCP, cardiologist or the [facility] at [phone].     Allergies/Intolerances:  Allergies  Allergen Reactions   Aspirin  Other (See Comments)    Irritates the stomach and the patient developed ulcers, also   Lisinopril  Other (See Comments) and Unknown    Caused a body ache     Medications:     Discharge Medications     PAUSE taking these medications      Details  ELIQUIS  5 mg tablet Wait to take this until: October 03, 2024 Bedtime Generic drug: apixaban   5 mg, 2 times Daily Refills: 0   TORsemide  20 MG tablet Wait to take this until: October 05, 2024 Commonly known as: DEMADEX   30 mg, Daily Refills: 0       Medications To Continue      Details  ADEMPAS  2 mg tablet Generic drug: riociguat   2 mg, 3 times  daily Refills: 0   amLODIPine  10 MG tablet Commonly known as: NORVASC   1 tablet, Daily Refills: 0   carvedilol  12.5 MG tablet Commonly known as: COREG   1 tablet, 2 times Daily with meals Refills: 0   DULoxetine  60 MG DR capsule Commonly known as: CYMBALTA   60 mg, Daily Refills: 0   ferrous sulfate  325 (65 FE) MG tablet  1 tablet, Every other day Refills: 0   gabapentin  800 MG tablet Commonly known as: NEURONTIN   800 mg, 3 times Daily Refills: 0   levETIRAcetam  250 MG tablet Commonly known as: KEPPRA   1 tablet, 2 times Daily Refills: 0   lidocaine  5 % patch Commonly known as: LIDODERM   1 patch Refills: 0   omeprazole  40 MG DR capsule Commonly known as: PriLOSEC  1 capsule, Daily Refills: 0   OPSUMIT  10 mg tablet Generic drug:  macitentan   10 mg, Daily Refills: 0   rOPINIRole  1 MG immediate release tablet Commonly known as: REQUIP   1 tablet, Nightly Refills: 0   rosuvastatin  20 MG tablet Commonly known as: CRESTOR   1 tablet, Daily Refills: 0   spironolactone  25 MG tablet Commonly known as: ALDACTONE   12.5 mg, Daily Refills: 0         Anticoagulation: Prescribed    Brief History of Present Illness: Per the H&P dated on 10/02/2024: Mr. Bozzi is a 73 y.o. male with a PMHX of DM2, adrenal adenoma, HFpEF, h/o GIB and PE in 2021 treated with TPA, with recurrence off anticoagulation, now with CTEPH (apixaban ) s/p BPAs 06/11/24, 07/10/24 and 07/31/24, spinal stenosis (multiple orthopedic/back surgeries; not able to stand) and chronic pain who presents for 4th RHC/BPA.   Last dose of apixaban  12/24 ~0600   Of note, had a recent hospitalization (12/12-12/15/2025) at [facility] for pneumonia, hypoxia and acute on chronic HFpEF. Treated with diuretics, oxygen  and antibiotics.   _____________________  Post-Procedure Course:     See procedure note below. There were no procedure complications. After the procedure, the patient was observed and discharged home in stable condition on Eliquis .     Social Drivers of Health with Concerns   Tobacco Use: Medium Risk (09/19/2024)   Received from [facility]   Patient History    Smoking Tobacco Use: Former    Smokeless Tobacco Use: Never    Passive Exposure: Never  Physical Activity: Inactive (04/06/2024)   Received from [facility]   Exercise Vital Sign    On average, how many days per week do you engage in moderate to strenuous exercise (like a brisk walk)?: 0 days  Housing Stability: Unknown (02/18/2024)   Housing Stability Vital Sign    Homeless in the Last Year: No    Imaging and Procedures Performed:   BPA 10/02/2024: Report pending at time of discharge  _____________________  Discharge Exam:  BP 127/74 (Patient Position: Sitting)  Comment: sitting ortho  Pulse 87   Temp 36.9 C (98.5 F) (Oral)   Resp 17   Ht 182.9 cm (6' 0.01)   Wt (!) 135.8 kg (299 lb 6.2 oz)   SpO2 91%   BMI 40.59 kg/m   General: alert, cooperative, and in NAD Respiratory: regular rate, symmetric, unlabored, no accessory muscle use, and breath sounds diminished left basilar, otherwise clear Cardiac: regular rate, regular rhythm, S1, S2 present, no murmur, no rub, no gallop, and JVD non-elevated Abdomen: normal bowel sounds, soft, nontender, and nondistended Extremities: extremities warm and well perfused, no clubbing or cyanosis, distal pulses intact, and edema  2+ Vascular Access Site: right and femoral without tenderness, ecchymosis, hematoma or bruit  Pertinent Lab Testing:  BMP: No results for input(s): NA, K, CL, CO2, BUN, CREATININE, GLUCOSE, CALCIUM , MG in the last 168 hours.  Invalid input(s): LABGLOM CBC: No results for input(s): WBC, HGB, HCT, PLT in the last 168 hours.  ____________________  Time spent on discharge process: >30 minutes  MICHELLE CASTILE MARTWICK, NP "

## 2024-10-05 NOTE — Progress Notes (Signed)
 "  ADVANCED HF CLINIC NOTE  Primary Care: Jordan, [name], MD HF Cardiologist: Dr. Rolan   HPI: Keith [name] is a 73 y.o.male with history saddle PE/LLE DVT 09/21 s/p TPA complicated by hypotension requiring Levophed , multiple prior back surgeries, left adrenal adenoma, DM II, obesity, HTN.   Admitted 03/01/22 with CP. He was hypoxic requiring supplemental oxygen . V/Q scan with moderate to high probability of PE.  Lifelong DOAC recommended, started on Eliquis  5 BID (had been off eliquis  since 02/22).   Echo in 5/23 showed EF 55-60%, mild LVH, interventricular septum flattened in systole and diastole consistent with RV pressure and volume overload, RV severely enlarged with severely reduced function, RVSP 75 mmH, moderate to severe TR.   RHC/LHC in 5/23 showed no CAD, elevated right and left heart filling pressures, and pulmonary venous hypertension.  He was seen by CT surgery. Felt to be high risk for pulmonary embolectomy. Course complicated by AKI, Scr peaked at 3.34 and improved to 1.60 (baseline). Started on torsemide  20 mg M, W, F at discharge. Atenolol /HTCZ stopped.   Echo in 9/23 showed EF 55-60%, mildly decreased RV function with mild-moderate RV enlargement, unable to estimate PA systolic pressure, IVC normal. RHC was done in 10/23, showing moderate pulmonary arterial hypertension with PVR 5 WU.    V/Q scan in 7/24 showed right lung findings suggestive of chronic PE.   Follow up 8/24, volume stable. Opsumit  10 mg daily added, referred to [facility] for consideration of balloon pulmonary angioplasty (would not be good candidate for pulmonary thromboendarterectomy).   Echo 8/24 showed hyperdynamic LV with EF 70-75%, RV read as normal.   Admitted from [facility] with ABLA related to GIB 12/24. Underwent EGD/colonoscopy showing erythematous mucosa;  2 polys that were removed, sigmoid colon diverticulosis, localized inflammation involving the distal rectum that was biopsied, nonbleeding external and  internal hemorrhoids, normal ileum and no blood noted throughout the GI lumen.   Echo 8/25 showed EF 65-70%, normal RV, unable to estimate PA systolic pressure.  He has been followed at [facility] for CTEPH. He is s/p BPA procedures x 2  RHC 07/10/24 at [facility]: RA 2, PA 36/16 (25), PCWP 2, PVR 4.1, CO/CI (Fick) 5.9/3.3, Pa Sat 63%  Admitted to [facility] with pneumonia, hypoxia and acute on chronic HFpEF. Treated with diuretics, oxygen  and antibiotics.   Presented to [facility] 10/02/24 for OP RHC/BPA. S/p successful balloon angioplasty of the following segmental branches of the right lung: A8, A4a, A3a, A3b and the following segment of the left lung: A6 with a 2.5 and 3.0 mm semi compliant balloon restoring brisk flow. RHC with RA 10, PA 52/19 (28), PCWP 23, PVR 0.4, CI 4.6. Eliquis  restarted later that day. Diuretics increased with elevated pressures.   Today he returns for AHF follow up with his wife. Overall feeling overweight. Denies palpitations, CP, dizziness, or PND/Orthopnea. Has swelling in BLE. SOB with activity. Appetite ok, does not watch what he eats. Has been trying to cut back on water  intake, drinks much more than 2L. No fever or chills. Does not weight at home. Taking all medications. Denies ETOH, tobacco or drug use. Able to complete ADLs at home.   PMH: 1. Type 2 diabetes 2. Left adrenal adenoma 3. Obesity 4. HTN 5. H/o back surgery for spinal stenosis: Has had 3 surgeries, actually could not walk after the third surgery but has been doing PT.  6. Venous thromboembolism: Saddle PE in 9/21 with LLE DVT, this was treated with TPA.   -  Recurrent PE in 5/23 after stopping DOAC.  - Echo (5/23): EF 55-60%, mild LVH, interventricular septum flattened in systole and diastole consistent with RV pressure and volume overload, RV severely enlarged with severely reduced function, RVSP 75 mmH, moderate to severe TR.  - R/LHC (5/23): RA 12, PA 48/34 (39), PCWP mean 40, V wave 42, Fick CO/CI 5.93/2.33, PVR ?<  1 WU.  - Echo (9/23): EF 55-60%, mildly decreased RV function with mild-moderate RV enlargement, unable to estimate PA systolic pressure, IVC normal.  - RHC (10/23): mean RA 9, PA 60/30 mean 36, mean PCWP 6, CI 2.46, PVR 5 WU - CTA chest (1/24): No PE - ANA/anti-cardiolipid/beta-2  glycoprotein/CCP negative, ESR 50, RF 42 (2/24) - V/Q scan (7/24): right lung findings suggestive of chronic PE.  - Echo (8/24): EF hyperdynamic LV with EF 70-75%, RV read as normal.  - Echo (8/25): 65-70%, normal RV, unable to estimate PA systolic pressure. - RHC ([facility], 07/10/24): RA 2, PA 36/16 (25), PCWP 2, PVR 4.1, CO/CI (Fick) 5.9/3.3, Pa Sat 63% 7. CKD stage 3 8. OSA: Uses CPAP.   Review of Systems: All systems reviewed and negative except as per HPI.   Current Outpatient Medications  Medication Sig Dispense Refill   ADEMPAS  2 MG TABS TAKE 1 TABLET THREE TIMES A DAY (IN THE MORNING, AT NOON, AND AT BEDTIME) 90 tablet 11   albuterol  (VENTOLIN  HFA) 108 (90 Base) MCG/ACT inhaler Inhale 2 puffs into the lungs every 6 (six) hours as needed for wheezing or shortness of breath. 6.7 g 2   amLODipine  (NORVASC ) 10 MG tablet TAKE 1 TABLET BY MOUTH DAILY 100 tablet 0   carvedilol  (COREG ) 12.5 MG tablet TAKE 1 TABLET (12.5MG  TOTAL) BY MOUTH TWICE A DAY WITH MEALS 180 tablet 3   cholecalciferol  (VITAMIN D3) 25 MCG (1000 UNIT) tablet Take 1,000 Units by mouth daily.     DULoxetine  (CYMBALTA ) 30 MG capsule TAKE 1 CAPSULE BY MOUTH DAILY 90 capsule 0   ELIQUIS  5 MG TABS tablet TAKE 1 TABLET BY MOUTH TWICE A DAY 180 tablet 0   ferrous sulfate  325 (65 FE) MG tablet TAKE 1 TABLET BY MOUTH EVERY DAY WITH BREAKFAST 90 tablet 1   gabapentin  (NEURONTIN ) 800 MG tablet Take 1 tablet (800 mg total) by mouth 4 (four) times daily for neuropathy. 120 tablet 0   HYDROcodone -acetaminophen  (NORCO) 10-325 MG tablet Take 1 tablet by mouth 6 (six) times daily as needed for pain. 180 tablet 0   latanoprost  (XALATAN ) 0.005 % ophthalmic solution  Place 1 drop into both eyes every evening. 2.5 mL 0   levETIRAcetam  (KEPPRA ) 250 MG tablet TAKE 1 TABLET BY MOUTH TWICE  DAILY 200 tablet 2   lidocaine  (LIDODERM ) 5 % Place 1 patch onto the skin daily as needed (pain). Remove & Discard patch within 12 hours or as directed by MD     naloxone  (NARCAN ) nasal spray 4 mg/0.1 mL Place 1 spray into the nose as needed. 2 each 3   omeprazole  (PRILOSEC) 40 MG capsule TAKE 1 CAPSULE BY MOUTH DAILY (Patient taking differently: Take 40 mg by mouth daily as needed.) 100 capsule 2   OPSUMIT  10 MG tablet Take 1 tablet daily. 30 tablet 5   polyethylene glycol (MIRALAX  / GLYCOLAX ) 17 g packet Take 17 g by mouth daily as needed for mild constipation.     potassium chloride  SA (KLOR-CON  M) 20 MEQ tablet Take 1 tablet (20 mEq total) by mouth daily. (Patient taking differently: Take 20 mEq  by mouth daily as needed.) 90 tablet 3   rOPINIRole  (REQUIP ) 1 MG tablet TAKE 1 TABLET BY MOUTH AT  BEDTIME 100 tablet 0   rosuvastatin  (CRESTOR ) 20 MG tablet TAKE 1 TABLET BY MOUTH DAILY 100 tablet 2   senna-docusate (SENOKOT-S) 8.6-50 MG tablet Take 2 tablets by mouth at bedtime. (Patient taking differently: Take 2 tablets by mouth at bedtime as needed.) 60 tablet 0   spironolactone  (ALDACTONE ) 25 MG tablet TAKE 1 TABLET BY MOUTH DAILY 100 tablet 2   torsemide  (DEMADEX ) 20 MG tablet Take 3 tablets (60 mg total) by mouth daily.     traZODone  (DESYREL ) 50 MG tablet TAKE 1/2 TO 1 TABLET BY MOUTH AT BEDTIME AS NEEDED FOR SLEEP 90 tablet 1   vitamin C  (ASCORBIC ACID ) 250 MG tablet Take 250 mg by mouth daily.     No current facility-administered medications for this encounter.   Allergies  Allergen Reactions   Aspirin  Other (See Comments)    Irritates the stomach and the patient developed ulcers, also   Lisinopril  Other (See Comments)    Caused a body ache   Social History   Socioeconomic History   Marital status: Married    Spouse name: Wanda   Number of children: 3    Years of education: 4 years college   Highest education level: Bachelor's degree (e.g., BA, AB, BS)  Occupational History   Occupation: retired  Tobacco Use   Smoking status: Former    Current packs/day: 0.00    Average packs/day: 1 pack/day for 25.0 years (25.0 ttl pk-yrs)    Types: Cigarettes    Start date: 48    Quit date: 1995    Years since quitting: 31.0    Passive exposure: Never   Smokeless tobacco: Never  Vaping Use   Vaping status: Never Used  Substance and Sexual Activity   Alcohol use: Yes    Comment: holidays and special occasions   Drug use: Not Currently   Sexual activity: Yes    Birth control/protection: None  Other Topics Concern   Not on file  Social History Narrative   Married, 5 kids, 14 grand, 2 great grand, watch TV, Star [employer] enthusiastic, traveling   Social Drivers of Health   Tobacco Use: Medium Risk (10/15/2024)   Patient History    Smoking Tobacco Use: Former    Smokeless Tobacco Use: Never    Passive Exposure: Never  Physicist, Medical Strain: Low Risk (04/06/2024)   Overall Financial Resource Strain (CARDIA)    Difficulty of Paying Living Expenses: Not hard at all  Food Insecurity: No Food Insecurity (09/19/2024)   Epic    Worried About Radiation Protection Practitioner of Food in the Last Year: Never true    Ran Out of Food in the Last Year: Never true  Transportation Needs: No Transportation Needs (09/19/2024)   Epic    Lack of Transportation (Medical): No    Lack of Transportation (Non-Medical): No  Physical Activity: Inactive (04/06/2024)   Exercise Vital Sign    Days of Exercise per Week: 0 days    Minutes of Exercise per Session: Not on file  Stress: No Stress Concern Present (04/06/2024)   [employer] of Occupational Health - Occupational Stress Questionnaire    Feeling of Stress: Not at all  Social Connections: Moderately Integrated (09/19/2024)   Social Connection and Isolation Panel    Frequency of Communication with Friends and Family: More  than three times a week    Frequency of Social Gatherings  with Friends and Family: Three times a week    Attends Religious Services: More than 4 times per year    Active Member of Clubs or Organizations: No    Attends Banker Meetings: Never    Marital Status: Married  Catering Manager Violence: Not At Risk (09/19/2024)   Epic    Fear of Current or Ex-Partner: No    Emotionally Abused: No    Physically Abused: No    Sexually Abused: No  Depression (PHQ2-9): Low Risk (04/07/2024)   Depression (PHQ2-9)    PHQ-2 Score: 0  Alcohol Screen: Low Risk (03/16/2023)   Alcohol Screen    Last Alcohol Screening Score (AUDIT): 0  Housing: Low Risk (09/19/2024)   Epic    Unable to Pay for Housing in the Last Year: No    Number of Times Moved in the Last Year: 0    Homeless in the Last Year: No  Utilities: Not At Risk (09/19/2024)   Epic    Threatened with loss of utilities: No  Health Literacy: Not on file   Family History  Problem Relation Age of Onset   Diabetes Mother    Asthma Mother    Breast cancer Mother    Colon polyps Mother    Hypertension Father    Stroke Father    Heart failure Brother    Stroke Brother    Ovarian cancer Maternal Grandmother    Hypertension Maternal Grandfather    Heart attack Maternal Grandfather    Colon cancer Neg Hx    BP (!) 110/53   Pulse 91   Ht 6' (1.829 m)   Wt 132 kg (291 lb)   SpO2 92%   BMI 39.47 kg/m   Wt Readings from Last 3 Encounters:  10/15/24 132 kg (291 lb)  09/19/24 134.7 kg (296 lb 15.4 oz)  07/14/24 109.3 kg (241 lb)   PHYSICAL EXAM: General:  chronically ill appearing.  No respiratory difficulty. Arrived in [facility] Neck: JVD to jaw.  Cor: Regular rate & rhythm. No murmurs. Lungs: clear Extremities: +3-3 BLE to thigh edema  Neuro: alert & oriented x 3. Affect pleasant.   ASSESSMENT & PLAN: 1.  Acute on chronic diastolic CHF with RV failure:  Suspect RV strain from prior PEs.  Echo in 5/23 showed EF 55-60%, RV  severely reduced, RVSP 75 mmHg, moderate to severe TR.  Echo in 9/23 showed EF 55-60%, mildly decreased RV function with mild-moderate RV enlargement, unable to estimate PA systolic pressure, IVC normal.  RHC in 10/23 showed moderate pulmonary arterial hypertension.  Echo (8/24) showed hyperdynamic LV with EF 70-75%, RV normal, unable to estimate PA pressure. Echo 8/25 showed EF 65-70%, normal RV, unable to estimate PA systolic pressure. RHC at [facility] 10/25 showed well compensated filling pressures, CI 3.3.  - Today, he remains NYHA IIIa-IIIb, complicated by WC bound status and spinal stenosis. He is volume overloaded by exam, weight relatively unchanged. ReDs 36%. Suspect some LEE is in part due to immobility and venous stasis.  - Increase torsemide  to 60 mg bid x 3 days, then 80 mg daily. BMET/P-BNP today, repeat BMET in 1 week - Will also give him 2.5 mg metolazone  x2 days + 40 mEq KDUR.  - Reports taking KDUR at home PRN. Will wait to see labs and then reorder for possibly daily.  - Continue spironolactone  25 mg daily.  - Continue Coreg  12.5 mg bid - Avoid SGLT2i with recurrent UTIs, obesity and limited mobility.  - He  needs LE compression. ABIs (-), does not qualify for unna boots with HH.Continue compression hose. 2. Pulmonary hypertension: RHC in 10/23 showed moderate pulmonary arterial hypertension. Based on history, this seems most likely to be chronic thromboembolic pulmonary hypertension.  He was found to have elevations in RF and ESR but negative CCP antibody.  Workup by rheumatology did not show signs of active inflammatory disease on exam. V/Q scan was finally done in 7/24, this was consistent with chronic pulmonary emboli in the right lung. Echo (8/24) showed hyperdynamic LV, RV read as normal, PA pressures unable to be estimated. Echo 8/25 showed EF 65-70%, normal RV, unable to estimate PA systolic pressure. RHC 10/25 at [facility] showed RA 2, PA 36/16 (25), PCWP 2, PVR 4.1, CO/CI (Fick) 5.9/3.3,  Pa Sat 63% - RHC/BPA 12/25:  RHC with RA 10, PA 52/19 (28), PCWP 23, PVR 0.4, CI 4.6. S/p successful balloon angioplasty of the following segmental branches of the right lung: A8, A4a, A3a, A3b and the following segment of the left lung: A6 with a 2.5 and 3.0 mm semi compliant balloon restoring brisk flow. - V/Q [facility] at [facility] 5/25 findings unchanged from prior study, consistent with CTEPH - Continue Eliquis  5 mg bid. No bleeding issues. CBC today, denies abnormal bleeding.  - Continue CPAP for OSA, intermittent compliance  - Continue adempas  2 mg tid - Continue Opsumit  10 mg daily.  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 Cannot rule this out. - s/p 4 BPA procedures at [facility] 3. Tricuspid regurgitation: Moderate to severe on echo 05/23. Minimal on echo in 9/23. None on most recent echo (8/25).  4. History PE/DVT: PE/DVT 09/21 s/p TPA, required pressor support with NE. Recurrent PE in 5/23 off Eliquis . Now concern for chronic thromboembolic disease as above.  - Will need lifelong anticoagulation. Continue Eliquis . 5. HTN: BP controlled.  6. Spinal stenosis: Limited mobility at baseline.  7. OSA: Encouraged better compliance with CPAP 8. Obesity: Body mass index is 39.47 kg/m. - He has stopped his Mounjaro  due to side effects. Wants to be rereferred to Pharmacy clinic to explore different options. Will place referral.   Follow up next week with APP to check fluid status.   Beckey LITTIE Coe, NP 10/15/2024 "

## 2024-10-12 ENCOUNTER — Other Ambulatory Visit: Payer: Self-pay | Admitting: Cardiology

## 2024-10-12 ENCOUNTER — Other Ambulatory Visit: Payer: Self-pay | Admitting: Family Medicine

## 2024-10-12 DIAGNOSIS — I10 Essential (primary) hypertension: Secondary | ICD-10-CM

## 2024-10-12 DIAGNOSIS — K219 Gastro-esophageal reflux disease without esophagitis: Secondary | ICD-10-CM

## 2024-10-12 DIAGNOSIS — R258 Other abnormal involuntary movements: Secondary | ICD-10-CM

## 2024-10-12 DIAGNOSIS — G894 Chronic pain syndrome: Secondary | ICD-10-CM

## 2024-10-13 ENCOUNTER — Other Ambulatory Visit (HOSPITAL_COMMUNITY): Payer: Self-pay

## 2024-10-14 ENCOUNTER — Telehealth (HOSPITAL_COMMUNITY): Payer: Self-pay

## 2024-10-14 NOTE — Telephone Encounter (Signed)
 Called to confirm/remind patient of their appointment at the [facility] on 10/16/23 8:30.   Appointment:   [x] Confirmed  [] Left mess   [] No answer/No voice mail  [] VM Full/unable to leave message  [] Phone not in service  Patient reminded to bring all medications and/or complete list.  Confirmed patient has transportation. Gave directions, instructed to utilize valet parking.

## 2024-10-15 ENCOUNTER — Ambulatory Visit (HOSPITAL_COMMUNITY)
Admission: RE | Admit: 2024-10-15 | Discharge: 2024-10-15 | Disposition: A | Discharge status: Home / Self Care | Source: Ambulatory Visit | Attending: Internal Medicine | Admitting: Internal Medicine

## 2024-10-15 ENCOUNTER — Ambulatory Visit (HOSPITAL_COMMUNITY): Payer: Self-pay | Admitting: Internal Medicine

## 2024-10-15 ENCOUNTER — Other Ambulatory Visit (HOSPITAL_COMMUNITY): Payer: Self-pay

## 2024-10-15 ENCOUNTER — Encounter (HOSPITAL_COMMUNITY): Payer: Self-pay

## 2024-10-15 VITALS — BP 110/53 | HR 91 | Ht 72.0 in | Wt 291.0 lb

## 2024-10-15 DIAGNOSIS — G4733 Obstructive sleep apnea (adult) (pediatric): Secondary | ICD-10-CM | POA: Insufficient documentation

## 2024-10-15 DIAGNOSIS — Z87891 Personal history of nicotine dependence: Secondary | ICD-10-CM | POA: Diagnosis not present

## 2024-10-15 DIAGNOSIS — E669 Obesity, unspecified: Secondary | ICD-10-CM | POA: Diagnosis not present

## 2024-10-15 DIAGNOSIS — N183 Chronic kidney disease, stage 3 unspecified: Secondary | ICD-10-CM | POA: Insufficient documentation

## 2024-10-15 DIAGNOSIS — E1122 Type 2 diabetes mellitus with diabetic chronic kidney disease: Secondary | ICD-10-CM | POA: Insufficient documentation

## 2024-10-15 DIAGNOSIS — I2724 Chronic thromboembolic pulmonary hypertension: Secondary | ICD-10-CM | POA: Diagnosis not present

## 2024-10-15 DIAGNOSIS — E8770 Fluid overload, unspecified: Secondary | ICD-10-CM | POA: Diagnosis not present

## 2024-10-15 DIAGNOSIS — I130 Hypertensive heart and chronic kidney disease with heart failure and stage 1 through stage 4 chronic kidney disease, or unspecified chronic kidney disease: Secondary | ICD-10-CM | POA: Diagnosis present

## 2024-10-15 DIAGNOSIS — Z79899 Other long term (current) drug therapy: Secondary | ICD-10-CM | POA: Insufficient documentation

## 2024-10-15 DIAGNOSIS — I10 Essential (primary) hypertension: Secondary | ICD-10-CM

## 2024-10-15 DIAGNOSIS — I2721 Secondary pulmonary arterial hypertension: Secondary | ICD-10-CM | POA: Insufficient documentation

## 2024-10-15 DIAGNOSIS — Z86711 Personal history of pulmonary embolism: Secondary | ICD-10-CM | POA: Insufficient documentation

## 2024-10-15 DIAGNOSIS — I5033 Acute on chronic diastolic (congestive) heart failure: Secondary | ICD-10-CM | POA: Insufficient documentation

## 2024-10-15 DIAGNOSIS — Z6839 Body mass index (BMI) 39.0-39.9, adult: Secondary | ICD-10-CM | POA: Insufficient documentation

## 2024-10-15 DIAGNOSIS — I272 Pulmonary hypertension, unspecified: Secondary | ICD-10-CM | POA: Diagnosis not present

## 2024-10-15 DIAGNOSIS — Z993 Dependence on wheelchair: Secondary | ICD-10-CM | POA: Insufficient documentation

## 2024-10-15 DIAGNOSIS — I071 Rheumatic tricuspid insufficiency: Secondary | ICD-10-CM | POA: Insufficient documentation

## 2024-10-15 DIAGNOSIS — M4800 Spinal stenosis, site unspecified: Secondary | ICD-10-CM | POA: Insufficient documentation

## 2024-10-15 LAB — BASIC METABOLIC PANEL WITH GFR
Anion gap: 10 (ref 5–15)
BUN: 20 mg/dL (ref 8–23)
CO2: 27 mmol/L (ref 22–32)
Calcium: 10.4 mg/dL — ABNORMAL HIGH (ref 8.9–10.3)
Chloride: 105 mmol/L (ref 98–111)
Creatinine, Ser: 1.24 mg/dL (ref 0.61–1.24)
GFR, Estimated: >60 mL/min
Glucose, Bld: 180 mg/dL — ABNORMAL HIGH (ref 70–99)
Potassium: 4.9 mmol/L (ref 3.5–5.1)
Sodium: 142 mmol/L (ref 135–145)

## 2024-10-15 LAB — CBC
HCT: 42 % (ref 39.0–52.0)
Hemoglobin: 12.6 g/dL — ABNORMAL LOW (ref 13.0–17.0)
MCH: 27 pg (ref 26.0–34.0)
MCHC: 30 g/dL (ref 30.0–36.0)
MCV: 90.1 fL (ref 80.0–100.0)
Platelets: 269 K/uL (ref 150–400)
RBC: 4.66 MIL/uL (ref 4.22–5.81)
RDW: 18.1 % — ABNORMAL HIGH (ref 11.5–15.5)
WBC: 7.2 K/uL (ref 4.0–10.5)
nRBC: 0 % (ref 0.0–0.2)

## 2024-10-15 LAB — PRO BRAIN NATRIURETIC PEPTIDE: Pro Brain Natriuretic Peptide: 94.3 pg/mL

## 2024-10-15 MED ORDER — METOLAZONE 2.5 MG PO TABS
2.5000 mg | ORAL_TABLET | ORAL | 0 refills | Status: AC
  Filled 2024-10-15: qty 5, 5d supply, fill #0

## 2024-10-15 MED ORDER — TORSEMIDE 20 MG PO TABS
80.0000 mg | ORAL_TABLET | Freq: Every day | ORAL | 3 refills | Status: DC
  Filled 2024-10-15: qty 180, 45d supply, fill #0

## 2024-10-15 NOTE — Patient Instructions (Signed)
 CHANGE Torsemide  to 60 mg Twice daily for 3 days, then CHANGE to 80 mg daily.  TAKE METOLAZONE  2.5 MG DAILY FOR THE NEXT 2 DAYS WITH 40 mEq ( 2 TABS) OF POTASSIUM DAILY.  Labs done today, your results will be available in MyChart, we will contact you for abnormal readings.   Your physician recommends that you schedule a follow-up appointment in: 1 week.  If you have any questions or concerns before your next appointment please send us  a message through [place] or call our office at [phone].    TO LEAVE A MESSAGE FOR THE NURSE SELECT OPTION 2, PLEASE LEAVE A MESSAGE INCLUDING: YOUR NAME DATE OF BIRTH CALL BACK NUMBER REASON FOR CALL**this is important as we prioritize the call backs  YOU WILL RECEIVE A CALL BACK THE SAME DAY AS LONG AS YOU CALL BEFORE 4:00 PM  At the [facility], you and your health needs are our priority. As part of our continuing mission to provide you with exceptional heart care, we have created designated Provider Care Teams. These Care Teams include your primary Cardiologist (physician) and Advanced Practice Providers (APPs- Physician Assistants and Nurse Practitioners) who all work together to provide you with the care you need, when you need it.   You may see any of the following providers on your designated Care Team at your next follow up: Dr Toribio Fuel Dr Ezra Shuck Dr. Morene Brownie Greig Mosses, NP Caffie Shed, [place] [facility] Car[place], [place] Beckey Coe, NP Jordan Lee, NP Ellouise Class, NP Tinnie Redman, PharmD Jaun Bash, PharmD   Please be sure to bring in all your medications bottles to every appointment.    Thank you for choosing [facility]

## 2024-10-19 ENCOUNTER — Other Ambulatory Visit (HOSPITAL_COMMUNITY): Payer: Self-pay

## 2024-10-20 ENCOUNTER — Telehealth (HOSPITAL_COMMUNITY): Payer: Self-pay

## 2024-10-20 ENCOUNTER — Other Ambulatory Visit: Payer: Self-pay | Admitting: Cardiology

## 2024-10-20 NOTE — Telephone Encounter (Signed)
 Called to confirm/remind patient of their appointment at the [facility] on 10/21/24 8:30.   Appointment:   [x] Confirmed  [] Left mess   [] No answer/No voice mail  [] VM Full/unable to leave message  [] Phone not in service  Patient reminded to bring all medications and/or complete list.  Confirmed patient has transportation. Gave directions, instructed to utilize valet parking.

## 2024-10-20 NOTE — Progress Notes (Signed)
 "  ADVANCED HF CLINIC NOTE  Primary Care: Jordan, [name], MD HF Cardiologist: Dr. Rolan   HPI: Keith [name] is a 74 y.o.male with history saddle PE/LLE DVT 09/21 s/p TPA complicated by hypotension requiring Levophed , multiple prior back surgeries, left adrenal adenoma, DM II, obesity, HTN.   Admitted 03/01/22 with CP. He was hypoxic requiring supplemental oxygen . V/Q scan with moderate to high probability of PE.  Lifelong DOAC recommended, started on Eliquis  5 BID (had been off eliquis  since 02/22).   Echo in 5/23 showed EF 55-60%, mild LVH, interventricular septum flattened in systole and diastole consistent with RV pressure and volume overload, RV severely enlarged with severely reduced function, RVSP 75 mmH, moderate to severe TR.   RHC/LHC in 5/23 showed no CAD, elevated right and left heart filling pressures, and pulmonary venous hypertension.  He was seen by CT surgery. Felt to be high risk for pulmonary embolectomy. Course complicated by AKI, Scr peaked at 3.34 and improved to 1.60 (baseline). Started on torsemide  20 mg M, W, F at discharge. Atenolol /HTCZ stopped.   Echo in 9/23 showed EF 55-60%, mildly decreased RV function with mild-moderate RV enlargement, unable to estimate PA systolic pressure, IVC normal. RHC was done in 10/23, showing moderate pulmonary arterial hypertension with PVR 5 WU.    V/Q scan in 7/24 showed right lung findings suggestive of chronic PE.   Follow up 8/24, volume stable. Opsumit  10 mg daily added, referred to [facility] for consideration of balloon pulmonary angioplasty (would not be good candidate for pulmonary thromboendarterectomy).   Echo 8/24 showed hyperdynamic LV with EF 70-75%, RV read as normal.   Admitted from [facility] with ABLA related to GIB 12/24. Underwent EGD/colonoscopy showing erythematous mucosa;  2 polys that were removed, sigmoid colon diverticulosis, localized inflammation involving the distal rectum that was biopsied, nonbleeding external and  internal hemorrhoids, normal ileum and no blood noted throughout the GI lumen.   Echo 8/25 showed EF 65-70%, normal RV, unable to estimate PA systolic pressure.  He has been followed at [facility] for CTEPH. He is s/p BPA procedures x 2  RHC 07/10/24 at [facility]: RA 2, PA 36/16 (25), PCWP 2, PVR 4.1, CO/CI (Fick) 5.9/3.3, Pa Sat 63%  Admitted to [facility] with pneumonia, hypoxia and acute on chronic HFpEF. Treated with diuretics, oxygen  and antibiotics.   Presented to [facility] 10/02/24 for OP RHC/BPA. S/p successful balloon angioplasty of the following segmental branches of the right lung: A8, A4a, A3a, A3b and the following segment of the left lung: A6 with a 2.5 and 3.0 mm semi compliant balloon restoring brisk flow. RHC with RA 10, PA 52/19 (28), PCWP 23, PVR 0.4, CI 4.6. Eliquis  restarted later that day. Diuretics increased with elevated pressures.   He was seen in the HF clinic 10/15/24. Volume overloaded. Torsemide  was increased 60 mg bid x 3 days + a couple doses of metolazone , then torsemide  80 mg daily.   Today he returns for HF follow up.Overall feeling fine. Denies SOB/PND/Orthopnea. Appetite ok. No fever or chills. Weight at home  pounds. Taking all medications   PMH: 1. Type 2 diabetes 2. Left adrenal adenoma 3. Obesity 4. HTN 5. H/o back surgery for spinal stenosis: Has had 3 surgeries, actually could not walk after the third surgery but has been doing PT.  6. Venous thromboembolism: Saddle PE in 9/21 with LLE DVT, this was treated with TPA.   - Recurrent PE in 5/23 after stopping DOAC.  - Echo (5/23): EF 55-60%, mild LVH,  interventricular septum flattened in systole and diastole consistent with RV pressure and volume overload, RV severely enlarged with severely reduced function, RVSP 75 mmH, moderate to severe TR.  - R/LHC (5/23): RA 12, PA 48/34 (39), PCWP mean 40, V wave 42, Fick CO/CI 5.93/2.33, PVR ?< 1 WU.  - Echo (9/23): EF 55-60%, mildly decreased RV function with mild-moderate RV  enlargement, unable to estimate PA systolic pressure, IVC normal.  - RHC (10/23): mean RA 9, PA 60/30 mean 36, mean PCWP 6, CI 2.46, PVR 5 WU - CTA chest (1/24): No PE - ANA/anti-cardiolipid/beta-2  glycoprotein/CCP negative, ESR 50, RF 42 (2/24) - V/Q scan (7/24): right lung findings suggestive of chronic PE.  - Echo (8/24): EF hyperdynamic LV with EF 70-75%, RV read as normal.  - Echo (8/25): 65-70%, normal RV, unable to estimate PA systolic pressure. - RHC ([facility], 07/10/24): RA 2, PA 36/16 (25), PCWP 2, PVR 4.1, CO/CI (Fick) 5.9/3.3, Pa Sat 63% 7. CKD stage 3 8. OSA: Uses CPAP.   Review of Systems: All systems reviewed and negative except as per HPI.   Current Outpatient Medications  Medication Sig Dispense Refill   ADEMPAS  2 MG TABS TAKE 1 TABLET THREE TIMES A DAY (IN THE MORNING, AT NOON, AND AT BEDTIME) 90 tablet 11   albuterol  (VENTOLIN  HFA) 108 (90 Base) MCG/ACT inhaler Inhale 2 puffs into the lungs every 6 (six) hours as needed for wheezing or shortness of breath. 6.7 g 2   amLODipine  (NORVASC ) 10 MG tablet TAKE 1 TABLET BY MOUTH DAILY 100 tablet 0   carvedilol  (COREG ) 12.5 MG tablet TAKE 1 TABLET (12.5MG  TOTAL) BY MOUTH TWICE A DAY WITH MEALS 180 tablet 3   cholecalciferol  (VITAMIN D3) 25 MCG (1000 UNIT) tablet Take 1,000 Units by mouth daily.     DULoxetine  (CYMBALTA ) 30 MG capsule TAKE 1 CAPSULE BY MOUTH DAILY 90 capsule 0   ELIQUIS  5 MG TABS tablet TAKE 1 TABLET BY MOUTH TWICE A DAY 180 tablet 0   ferrous sulfate  325 (65 FE) MG tablet TAKE 1 TABLET BY MOUTH EVERY DAY WITH BREAKFAST 90 tablet 1   gabapentin  (NEURONTIN ) 800 MG tablet Take 1 tablet (800 mg total) by mouth 4 (four) times daily for neuropathy. 120 tablet 0   HYDROcodone -acetaminophen  (NORCO) 10-325 MG tablet Take 1 tablet by mouth 6 (six) times daily as needed for pain. 180 tablet 0   latanoprost  (XALATAN ) 0.005 % ophthalmic solution Place 1 drop into both eyes every evening. 2.5 mL 0   levETIRAcetam  (KEPPRA ) 250 MG  tablet TAKE 1 TABLET BY MOUTH TWICE  DAILY 200 tablet 2   lidocaine  (LIDODERM ) 5 % Place 1 patch onto the skin daily as needed (pain). Remove & Discard patch within 12 hours or as directed by MD     metolazone  (ZAROXOLYN ) 2.5 MG tablet Take 1 tablet (2.5 mg total) by mouth as directed. Take as directed by the [facility] ONLY 5 tablet 0   naloxone  (NARCAN ) nasal spray 4 mg/0.1 mL Place 1 spray into the nose as needed. 2 each 3   omeprazole  (PRILOSEC) 40 MG capsule TAKE 1 CAPSULE BY MOUTH DAILY (Patient taking differently: Take 40 mg by mouth daily as needed.) 100 capsule 2   OPSUMIT  10 MG tablet TAKE 1 TABLET DAILY. 30 tablet 5   polyethylene glycol (MIRALAX  / GLYCOLAX ) 17 g packet Take 17 g by mouth daily as needed for mild constipation.     potassium chloride  SA (KLOR-CON  M) 20 MEQ tablet  Take 1 tablet (20 mEq total) by mouth daily. (Patient taking differently: Take 20 mEq by mouth daily as needed.) 90 tablet 3   rOPINIRole  (REQUIP ) 1 MG tablet TAKE 1 TABLET BY MOUTH AT  BEDTIME 100 tablet 0   rosuvastatin  (CRESTOR ) 20 MG tablet TAKE 1 TABLET BY MOUTH DAILY 100 tablet 2   senna-docusate (SENOKOT-S) 8.6-50 MG tablet Take 2 tablets by mouth at bedtime. (Patient taking differently: Take 2 tablets by mouth at bedtime as needed.) 60 tablet 0   spironolactone  (ALDACTONE ) 25 MG tablet TAKE 1 TABLET BY MOUTH DAILY 100 tablet 2   torsemide  (DEMADEX ) 20 MG tablet Take 4 tablets (80 mg total) by mouth daily. 180 tablet 3   traZODone  (DESYREL ) 50 MG tablet TAKE 1/2 TO 1 TABLET BY MOUTH AT BEDTIME AS NEEDED FOR SLEEP 90 tablet 1   vitamin C  (ASCORBIC ACID ) 250 MG tablet Take 250 mg by mouth daily.     No current facility-administered medications for this visit.   Allergies  Allergen Reactions   Aspirin  Other (See Comments)    Irritates the stomach and the patient developed ulcers, also   Lisinopril  Other (See Comments)    Caused a body ache   Social History   Socioeconomic History    Marital status: Married    Spouse name: Wanda   Number of children: 3   Years of education: 4 years college   Highest education level: Bachelor's degree (e.g., BA, AB, BS)  Occupational History   Occupation: retired  Tobacco Use   Smoking status: Former    Current packs/day: 0.00    Average packs/day: 1 pack/day for 25.0 years (25.0 ttl pk-yrs)    Types: Cigarettes    Start date: 22    Quit date: 1995    Years since quitting: 31.0    Passive exposure: Never   Smokeless tobacco: Never  Vaping Use   Vaping status: Never Used  Substance and Sexual Activity   Alcohol use: Yes    Comment: holidays and special occasions   Drug use: Not Currently   Sexual activity: Yes    Birth control/protection: None  Other Topics Concern   Not on file  Social History Narrative   Married, 5 kids, 14 grand, 2 great grand, watch TV, Star [employer] enthusiastic, traveling   Social Drivers of Health   Tobacco Use: Medium Risk (10/15/2024)   Patient History    Smoking Tobacco Use: Former    Smokeless Tobacco Use: Never    Passive Exposure: Never  Physicist, Medical Strain: Low Risk (04/06/2024)   Overall Financial Resource Strain (CARDIA)    Difficulty of Paying Living Expenses: Not hard at all  Food Insecurity: No Food Insecurity (09/19/2024)   Epic    Worried About Radiation Protection Practitioner of Food in the Last Year: Never true    Ran Out of Food in the Last Year: Never true  Transportation Needs: No Transportation Needs (09/19/2024)   Epic    Lack of Transportation (Medical): No    Lack of Transportation (Non-Medical): No  Physical Activity: Inactive (04/06/2024)   Exercise Vital Sign    Days of Exercise per Week: 0 days    Minutes of Exercise per Session: Not on file  Stress: No Stress Concern Present (04/06/2024)   [employer] of Occupational Health - Occupational Stress Questionnaire    Feeling of Stress: Not at all  Social Connections: Moderately Integrated (09/19/2024)   Social Connection  and Isolation Panel    Frequency of Communication with  Friends and Family: More than three times a week    Frequency of Social Gatherings with Friends and Family: Three times a week    Attends Religious Services: More than 4 times per year    Active Member of Clubs or Organizations: No    Attends Banker Meetings: Never    Marital Status: Married  Catering Manager Violence: Not At Risk (09/19/2024)   Epic    Fear of Current or Ex-Partner: No    Emotionally Abused: No    Physically Abused: No    Sexually Abused: No  Depression (PHQ2-9): Low Risk (04/07/2024)   Depression (PHQ2-9)    PHQ-2 Score: 0  Alcohol Screen: Low Risk (03/16/2023)   Alcohol Screen    Last Alcohol Screening Score (AUDIT): 0  Housing: Low Risk (09/19/2024)   Epic    Unable to Pay for Housing in the Last Year: No    Number of Times Moved in the Last Year: 0    Homeless in the Last Year: No  Utilities: Not At Risk (09/19/2024)   Epic    Threatened with loss of utilities: No  Health Literacy: Not on file   Family History  Problem Relation Age of Onset   Diabetes Mother    Asthma Mother    Breast cancer Mother    Colon polyps Mother    Hypertension Father    Stroke Father    Heart failure Brother    Stroke Brother    Ovarian cancer Maternal Grandmother    Hypertension Maternal Grandfather    Heart attack Maternal Grandfather    Colon cancer Neg Hx    There were no vitals taken for this visit.  Wt Readings from Last 3 Encounters:  10/15/24 132 kg (291 lb)  09/19/24 134.7 kg (296 lb 15.4 oz)  07/14/24 109.3 kg (241 lb)   PHYSICAL EXAM: General:   No resp difficulty Neck: no JVD.  Cor: Regular rate & rhythm.  Lungs: clear Abdomen: soft, nontender, nondistended.  Extremities: no  edema Neuro: alert & oriented x3   ASSESSMENT & PLAN: 1.  Acute on chronic diastolic CHF with RV failure:  Suspect RV strain from prior PEs.  Echo in 5/23 showed EF 55-60%, RV severely reduced, RVSP 75 mmHg,  moderate to severe TR.  Echo in 9/23 showed EF 55-60%, mildly decreased RV function with mild-moderate RV enlargement, unable to estimate PA systolic pressure, IVC normal.  RHC in 10/23 showed moderate pulmonary arterial hypertension.  Echo (8/24) showed hyperdynamic LV with EF 70-75%, RV normal, unable to estimate PA pressure. Echo 8/25 showed EF 65-70%, normal RV, unable to estimate PA systolic pressure. RHC at [facility] 10/25 showed well compensated filling pressures, CI 3.3.  - NYHA  - Volume status  -Check BEMT  - Continue spironolactone  25 mg daily.  - Continue Coreg  12.5 mg bid - Avoid SGLT2i with recurrent UTIs, obesity and limited mobility.  - He needs LE compression. ABIs (-), does not qualify for unna boots with HH.Continue compression hose. 2. Pulmonary hypertension: RHC in 10/23 showed moderate pulmonary arterial hypertension. Based on history, this seems most likely to be chronic thromboembolic pulmonary hypertension.  He was found to have elevations in RF and ESR but negative CCP antibody.  Workup by rheumatology did not show signs of active inflammatory disease on exam. V/Q scan was finally done in 7/24, this was consistent with chronic pulmonary emboli in the right lung. Echo (8/24) showed hyperdynamic LV, RV read as normal, PA pressures unable  to be estimated. Echo 8/25 showed EF 65-70%, normal RV, unable to estimate PA systolic pressure. RHC 10/25 at [facility] showed RA 2, PA 36/16 (25), PCWP 2, PVR 4.1, CO/CI (Fick) 5.9/3.3, Pa Sat 63% - RHC/BPA 12/25:  RHC with RA 10, PA 52/19 (28), PCWP 23, PVR 0.4, CI 4.6. S/p successful balloon angioplasty of the following segmental branches of the right lung: A8, A4a, A3a, A3b and the following segment of the left lung: A6 with a 2.5 and 3.0 mm semi compliant balloon restoring brisk flow. - V/Q scan at [facility] 5/25 findings unchanged from prior study, consistent with CTEPH - Continue Eliquis  5 mg bid. No bleeding issues. CBC today, denies abnormal bleeding.   - Continue CPAP for OSA, intermittent compliance  - Continue adempas  2 mg tid - Continue Opsumit  10 mg daily.  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 Cannot rule this out. - s/p 4 BPA procedures at [facility] 3. Tricuspid regurgitation: Moderate to severe on echo 05/23. Minimal on echo in 9/23. None on most recent echo (8/25).  4. History PE/DVT: PE/DVT 09/21 s/p TPA, required pressor support with NE. Recurrent PE in 5/23 off Eliquis . Now concern for chronic thromboembolic disease as above.  - Will need lifelong anticoagulation. Continue Eliquis . 5. HTN: BP controlled.  6. Spinal stenosis: Limited mobility at baseline.  7. OSA: Encouraged better compliance with CPAP 8. Obesity: There is no height or weight on file to calculate BMI. - He has stopped his Mounjaro  due to side effects. Wants to be rereferred to Pharmacy clinic to explore different options. Will place referral.     Greig Mosses, NP 10/20/2024 "

## 2024-10-21 ENCOUNTER — Ambulatory Visit (HOSPITAL_COMMUNITY): Payer: Self-pay | Admitting: Adult Health

## 2024-10-21 ENCOUNTER — Encounter (HOSPITAL_COMMUNITY): Payer: Self-pay

## 2024-10-21 ENCOUNTER — Ambulatory Visit (HOSPITAL_COMMUNITY)
Admission: RE | Admit: 2024-10-21 | Discharge: 2024-10-21 | Disposition: A | Discharge status: Home / Self Care | Source: Ambulatory Visit | Attending: Adult Health | Admitting: Adult Health

## 2024-10-21 VITALS — BP 113/66 | HR 86 | Ht 72.0 in | Wt 290.0 lb

## 2024-10-21 DIAGNOSIS — G4733 Obstructive sleep apnea (adult) (pediatric): Secondary | ICD-10-CM | POA: Diagnosis not present

## 2024-10-21 DIAGNOSIS — I2724 Chronic thromboembolic pulmonary hypertension: Secondary | ICD-10-CM | POA: Diagnosis not present

## 2024-10-21 DIAGNOSIS — E1122 Type 2 diabetes mellitus with diabetic chronic kidney disease: Secondary | ICD-10-CM | POA: Diagnosis not present

## 2024-10-21 DIAGNOSIS — I130 Hypertensive heart and chronic kidney disease with heart failure and stage 1 through stage 4 chronic kidney disease, or unspecified chronic kidney disease: Secondary | ICD-10-CM | POA: Insufficient documentation

## 2024-10-21 DIAGNOSIS — E669 Obesity, unspecified: Secondary | ICD-10-CM | POA: Insufficient documentation

## 2024-10-21 DIAGNOSIS — Z7901 Long term (current) use of anticoagulants: Secondary | ICD-10-CM | POA: Insufficient documentation

## 2024-10-21 DIAGNOSIS — Z86018 Personal history of other benign neoplasm: Secondary | ICD-10-CM | POA: Insufficient documentation

## 2024-10-21 DIAGNOSIS — M2550 Pain in unspecified joint: Secondary | ICD-10-CM | POA: Diagnosis not present

## 2024-10-21 DIAGNOSIS — N183 Chronic kidney disease, stage 3 unspecified: Secondary | ICD-10-CM | POA: Insufficient documentation

## 2024-10-21 DIAGNOSIS — Z655 Exposure to disaster, war and other hostilities: Secondary | ICD-10-CM | POA: Insufficient documentation

## 2024-10-21 DIAGNOSIS — M4800 Spinal stenosis, site unspecified: Secondary | ICD-10-CM | POA: Insufficient documentation

## 2024-10-21 DIAGNOSIS — Z86718 Personal history of other venous thrombosis and embolism: Secondary | ICD-10-CM | POA: Insufficient documentation

## 2024-10-21 DIAGNOSIS — I5032 Chronic diastolic (congestive) heart failure: Secondary | ICD-10-CM | POA: Diagnosis present

## 2024-10-21 DIAGNOSIS — I2721 Secondary pulmonary arterial hypertension: Secondary | ICD-10-CM | POA: Diagnosis not present

## 2024-10-21 DIAGNOSIS — Z79899 Other long term (current) drug therapy: Secondary | ICD-10-CM | POA: Insufficient documentation

## 2024-10-21 DIAGNOSIS — Z86711 Personal history of pulmonary embolism: Secondary | ICD-10-CM | POA: Insufficient documentation

## 2024-10-21 DIAGNOSIS — I272 Pulmonary hypertension, unspecified: Secondary | ICD-10-CM

## 2024-10-21 DIAGNOSIS — Z87891 Personal history of nicotine dependence: Secondary | ICD-10-CM | POA: Diagnosis not present

## 2024-10-21 DIAGNOSIS — Z6839 Body mass index (BMI) 39.0-39.9, adult: Secondary | ICD-10-CM | POA: Diagnosis not present

## 2024-10-21 LAB — BASIC METABOLIC PANEL WITH GFR
Anion gap: 14 (ref 5–15)
BUN: 33 mg/dL — ABNORMAL HIGH (ref 8–23)
CO2: 29 mmol/L (ref 22–32)
Calcium: 10.3 mg/dL (ref 8.9–10.3)
Chloride: 101 mmol/L (ref 98–111)
Creatinine, Ser: 1.51 mg/dL — ABNORMAL HIGH (ref 0.61–1.24)
GFR, Estimated: 49 mL/min — ABNORMAL LOW
Glucose, Bld: 211 mg/dL — ABNORMAL HIGH (ref 70–99)
Potassium: 3.9 mmol/L (ref 3.5–5.1)
Sodium: 143 mmol/L (ref 135–145)

## 2024-10-21 MED ORDER — TORSEMIDE 20 MG PO TABS: 60.0000 mg | ORAL_TABLET | Freq: Every day | ORAL | 3 refills | Status: AC

## 2024-10-21 NOTE — Progress Notes (Signed)
"   ReDS Vest / Clip - 10/21/24 0841       ReDS Vest / Clip   Station Marker D    Ruler Value 37    ReDS Value Range Low volume    ReDS Actual Value 33          "

## 2024-10-21 NOTE — Telephone Encounter (Signed)
 Pt aware repeat labs 1/22

## 2024-10-21 NOTE — Patient Instructions (Signed)
 Medication Changes:  None, continue current medications   Lab Work:  Labs done today, your results will be available in MyChart, we will contact you for abnormal readings.   Special Instructions // Education:  Do the following things EVERYDAY: Weigh yourself in the morning before breakfast. Write it down and keep it in a log. Take your medicines as prescribed Eat low salt foods--Limit salt (sodium) to 2000 mg per day.  Stay as active as you can everyday Limit all fluids for the day to less than 2 liters   Follow-Up in: 3 months   At the [facility], you and your health needs are our priority. We have a designated team specialized in the treatment of Heart Failure. This Care Team includes your primary Heart Failure Specialized Cardiologist (physician), Advanced Practice Providers (APPs- Physician Assistants and Nurse Practitioners), and Pharmacist who all work together to provide you with the care you need, when you need it.   You may see any of the following providers on your designated Care Team at your next follow up:  Dr. Toribio Fuel Dr. Ezra Shuck Dr. Odis Brownie Greig Mosses, NP Caffie Shed, [place] [facility] [place], [place] Beckey Coe, NP Jordan Lee, NP Tinnie Redman, PharmD   Please be sure to bring in all your medications bottles to every appointment.   Need to Contact Us :  If you have any questions or concerns before your next appointment please send us  a message through [place] or call our office at [phone].    TO LEAVE A MESSAGE FOR THE NURSE SELECT OPTION 2, PLEASE LEAVE A MESSAGE INCLUDING: YOUR NAME DATE OF BIRTH CALL BACK NUMBER REASON FOR CALL**this is important as we prioritize the call backs  YOU WILL RECEIVE A CALL BACK THE SAME DAY AS LONG AS YOU CALL BEFORE 4:00 PM

## 2024-10-22 ENCOUNTER — Other Ambulatory Visit (HOSPITAL_COMMUNITY): Payer: Self-pay

## 2024-10-22 MED ORDER — GABAPENTIN 800 MG PO TABS
800.0000 mg | ORAL_TABLET | Freq: Four times a day (QID) | ORAL | 0 refills | Status: AC
  Filled 2024-10-22 – 2024-10-29 (×3): qty 120, 30d supply, fill #0

## 2024-10-22 MED ORDER — HYDROCODONE-ACETAMINOPHEN 10-325 MG PO TABS
1.0000 | ORAL_TABLET | ORAL | 0 refills | Status: AC | PRN
  Filled 2024-10-22 – 2024-10-29 (×2): qty 180, 30d supply, fill #0

## 2024-10-29 ENCOUNTER — Ambulatory Visit (HOSPITAL_COMMUNITY)
Admission: RE | Admit: 2024-10-29 | Discharge: 2024-10-29 | Disposition: A | Discharge status: Home / Self Care | Source: Ambulatory Visit | Attending: Cardiology | Admitting: Cardiology

## 2024-10-29 ENCOUNTER — Other Ambulatory Visit (HOSPITAL_COMMUNITY): Payer: Self-pay

## 2024-10-29 DIAGNOSIS — I5032 Chronic diastolic (congestive) heart failure: Secondary | ICD-10-CM | POA: Diagnosis present

## 2024-10-29 LAB — BASIC METABOLIC PANEL WITH GFR
Anion gap: 10 (ref 5–15)
BUN: 23 mg/dL (ref 8–23)
CO2: 28 mmol/L (ref 22–32)
Calcium: 10.1 mg/dL (ref 8.9–10.3)
Chloride: 103 mmol/L (ref 98–111)
Creatinine, Ser: 1.17 mg/dL (ref 0.61–1.24)
GFR, Estimated: >60 mL/min
Glucose, Bld: 205 mg/dL — ABNORMAL HIGH (ref 70–99)
Potassium: 4.3 mmol/L (ref 3.5–5.1)
Sodium: 141 mmol/L (ref 135–145)

## 2024-10-30 ENCOUNTER — Ambulatory Visit (HOSPITAL_COMMUNITY): Payer: Self-pay | Admitting: Adult Health

## 2025-01-14 ENCOUNTER — Ambulatory Visit (HOSPITAL_COMMUNITY): Admitting: Cardiology
